# Patient Record
Sex: Male | Born: 1960 | Race: Black or African American | Hispanic: No
Health system: Southern US, Community
[De-identification: ages and names within clinical notes are randomized; demographics above are authoritative.]

## PROBLEM LIST (undated history)

## (undated) DIAGNOSIS — M1990 Unspecified osteoarthritis, unspecified site: Secondary | ICD-10-CM

## (undated) DIAGNOSIS — G8929 Other chronic pain: Secondary | ICD-10-CM

## (undated) DIAGNOSIS — I2510 Atherosclerotic heart disease of native coronary artery without angina pectoris: Secondary | ICD-10-CM

## (undated) DIAGNOSIS — T7840XA Allergy, unspecified, initial encounter: Secondary | ICD-10-CM

## (undated) DIAGNOSIS — E119 Type 2 diabetes mellitus without complications: Secondary | ICD-10-CM

## (undated) DIAGNOSIS — N2889 Other specified disorders of kidney and ureter: Secondary | ICD-10-CM

## (undated) DIAGNOSIS — Z95820 Peripheral vascular angioplasty status with implants and grafts: Secondary | ICD-10-CM

## (undated) DIAGNOSIS — Z9889 Other specified postprocedural states: Secondary | ICD-10-CM

## (undated) DIAGNOSIS — M545 Low back pain, unspecified: Secondary | ICD-10-CM

## (undated) DIAGNOSIS — I639 Cerebral infarction, unspecified: Secondary | ICD-10-CM

## (undated) DIAGNOSIS — I10 Essential (primary) hypertension: Secondary | ICD-10-CM

## (undated) DIAGNOSIS — F1020 Alcohol dependence, uncomplicated: Secondary | ICD-10-CM

## (undated) DIAGNOSIS — E785 Hyperlipidemia, unspecified: Secondary | ICD-10-CM

## (undated) DIAGNOSIS — M25519 Pain in unspecified shoulder: Secondary | ICD-10-CM

## (undated) DIAGNOSIS — R079 Chest pain, unspecified: Secondary | ICD-10-CM

## (undated) DIAGNOSIS — I200 Unstable angina: Secondary | ICD-10-CM

## (undated) DIAGNOSIS — I219 Acute myocardial infarction, unspecified: Secondary | ICD-10-CM

## (undated) DIAGNOSIS — K219 Gastro-esophageal reflux disease without esophagitis: Secondary | ICD-10-CM

## (undated) DIAGNOSIS — F1410 Cocaine abuse, uncomplicated: Secondary | ICD-10-CM

## (undated) HISTORY — PX: LIPOMA EXCISION: SHX5283

## (undated) HISTORY — DX: Unstable angina: I20.0

## (undated) HISTORY — DX: Chest pain, unspecified: R07.9

## (undated) HISTORY — DX: Other specified postprocedural states: Z98.890

## (undated) HISTORY — DX: Hyperlipidemia, unspecified: E78.5

## (undated) HISTORY — DX: Cerebral infarction, unspecified: I63.9

## (undated) HISTORY — PX: TONSILLECTOMY: SUR1361

## (undated) HISTORY — DX: Other chronic pain: G89.29

## (undated) HISTORY — DX: Cocaine abuse, uncomplicated: F14.10

## (undated) HISTORY — DX: Pain in unspecified shoulder: M25.519

## (undated) HISTORY — DX: Allergy, unspecified, initial encounter: T78.40XA

## (undated) HISTORY — DX: Alcohol dependence, uncomplicated: F10.20

---
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
## 1982-06-29 HISTORY — PX: EXCISIONAL HEMORRHOIDECTOMY: SHX1541

## 2002-06-12 ENCOUNTER — Inpatient Hospital Stay (HOSPITAL_COMMUNITY): Admission: EM | Admit: 2002-06-12 | Discharge: 2002-06-16 | Payer: Self-pay | Admitting: Emergency Medicine

## 2002-06-13 ENCOUNTER — Encounter: Payer: Self-pay | Admitting: Cardiology

## 2002-06-14 ENCOUNTER — Encounter: Payer: Self-pay | Admitting: Cardiology

## 2002-06-15 HISTORY — PX: CARDIAC CATHETERIZATION: SHX172

## 2002-08-23 ENCOUNTER — Emergency Department (HOSPITAL_COMMUNITY): Admission: EM | Admit: 2002-08-23 | Discharge: 2002-08-24 | Payer: Self-pay | Admitting: *Deleted

## 2002-09-19 ENCOUNTER — Emergency Department (HOSPITAL_COMMUNITY): Admission: EM | Admit: 2002-09-19 | Discharge: 2002-09-19 | Payer: Self-pay | Admitting: Emergency Medicine

## 2002-09-19 ENCOUNTER — Encounter: Payer: Self-pay | Admitting: Emergency Medicine

## 2002-09-20 ENCOUNTER — Emergency Department (HOSPITAL_COMMUNITY): Admission: EM | Admit: 2002-09-20 | Discharge: 2002-09-21 | Payer: Self-pay | Admitting: Emergency Medicine

## 2002-11-18 ENCOUNTER — Emergency Department (HOSPITAL_COMMUNITY): Admission: EM | Admit: 2002-11-18 | Discharge: 2002-11-18 | Payer: Self-pay | Admitting: Emergency Medicine

## 2003-02-20 ENCOUNTER — Emergency Department (HOSPITAL_COMMUNITY): Admission: EM | Admit: 2003-02-20 | Discharge: 2003-02-20 | Payer: Self-pay | Admitting: *Deleted

## 2003-02-20 ENCOUNTER — Encounter: Payer: Self-pay | Admitting: *Deleted

## 2003-03-06 ENCOUNTER — Emergency Department (HOSPITAL_COMMUNITY): Admission: EM | Admit: 2003-03-06 | Discharge: 2003-03-06 | Payer: Self-pay | Admitting: Emergency Medicine

## 2003-05-15 ENCOUNTER — Emergency Department (HOSPITAL_COMMUNITY): Admission: EM | Admit: 2003-05-15 | Discharge: 2003-05-16 | Payer: Self-pay | Admitting: Emergency Medicine

## 2003-05-20 ENCOUNTER — Emergency Department (HOSPITAL_COMMUNITY): Admission: EM | Admit: 2003-05-20 | Discharge: 2003-05-20 | Payer: Self-pay | Admitting: Emergency Medicine

## 2009-09-05 ENCOUNTER — Emergency Department (HOSPITAL_COMMUNITY): Admission: EM | Admit: 2009-09-05 | Discharge: 2009-09-06 | Payer: Self-pay | Admitting: Emergency Medicine

## 2009-09-06 ENCOUNTER — Inpatient Hospital Stay (HOSPITAL_COMMUNITY): Admission: RE | Admit: 2009-09-06 | Discharge: 2009-09-09 | Payer: Self-pay | Admitting: Psychiatry

## 2009-09-06 ENCOUNTER — Ambulatory Visit: Payer: Self-pay | Admitting: Psychiatry

## 2009-12-02 ENCOUNTER — Emergency Department (HOSPITAL_COMMUNITY): Admission: EM | Admit: 2009-12-02 | Discharge: 2009-12-02 | Payer: Self-pay | Admitting: Emergency Medicine

## 2010-02-10 ENCOUNTER — Other Ambulatory Visit: Payer: Self-pay

## 2010-02-11 ENCOUNTER — Inpatient Hospital Stay (HOSPITAL_COMMUNITY): Admission: AD | Admit: 2010-02-11 | Discharge: 2010-02-14 | Payer: Self-pay | Admitting: Psychiatry

## 2010-02-11 ENCOUNTER — Ambulatory Visit: Payer: Self-pay | Admitting: Psychiatry

## 2010-02-11 ENCOUNTER — Other Ambulatory Visit: Payer: Self-pay | Admitting: Emergency Medicine

## 2010-03-22 ENCOUNTER — Observation Stay (HOSPITAL_COMMUNITY): Admission: EM | Admit: 2010-03-22 | Discharge: 2010-03-23 | Payer: Self-pay | Admitting: Emergency Medicine

## 2010-03-31 ENCOUNTER — Inpatient Hospital Stay (HOSPITAL_COMMUNITY): Admission: AD | Admit: 2010-03-31 | Discharge: 2010-04-02 | Payer: Self-pay | Admitting: Internal Medicine

## 2010-04-01 HISTORY — PX: CARDIAC CATHETERIZATION: SHX172

## 2010-04-03 ENCOUNTER — Encounter (INDEPENDENT_AMBULATORY_CARE_PROVIDER_SITE_OTHER): Payer: Self-pay | Admitting: *Deleted

## 2010-04-14 ENCOUNTER — Encounter: Admission: RE | Admit: 2010-04-14 | Discharge: 2010-04-14 | Payer: Self-pay | Admitting: Family Medicine

## 2010-05-08 ENCOUNTER — Emergency Department (HOSPITAL_COMMUNITY): Admission: EM | Admit: 2010-05-08 | Discharge: 2010-05-08 | Payer: Self-pay | Admitting: Emergency Medicine

## 2010-05-08 ENCOUNTER — Encounter (HOSPITAL_COMMUNITY)
Admission: RE | Admit: 2010-05-08 | Discharge: 2010-07-29 | Payer: Self-pay | Source: Home / Self Care | Attending: Internal Medicine | Admitting: Internal Medicine

## 2010-05-27 ENCOUNTER — Encounter (INDEPENDENT_AMBULATORY_CARE_PROVIDER_SITE_OTHER): Payer: Self-pay | Admitting: *Deleted

## 2010-06-16 ENCOUNTER — Encounter
Admission: RE | Admit: 2010-06-16 | Discharge: 2010-07-29 | Payer: Self-pay | Source: Home / Self Care | Attending: Orthopedic Surgery | Admitting: Orthopedic Surgery

## 2010-06-19 ENCOUNTER — Emergency Department (HOSPITAL_COMMUNITY)
Admission: EM | Admit: 2010-06-19 | Discharge: 2010-06-19 | Payer: Self-pay | Source: Home / Self Care | Admitting: Emergency Medicine

## 2010-06-30 ENCOUNTER — Emergency Department (HOSPITAL_COMMUNITY)
Admission: EM | Admit: 2010-06-30 | Discharge: 2010-06-30 | Payer: Medicaid Other | Source: Home / Self Care | Admitting: Emergency Medicine

## 2010-07-10 ENCOUNTER — Encounter: Payer: Self-pay | Admitting: Gastroenterology

## 2010-07-10 ENCOUNTER — Ambulatory Visit
Admission: RE | Admit: 2010-07-10 | Discharge: 2010-07-10 | Payer: Self-pay | Source: Home / Self Care | Attending: Gastroenterology | Admitting: Gastroenterology

## 2010-07-10 DIAGNOSIS — I2510 Atherosclerotic heart disease of native coronary artery without angina pectoris: Secondary | ICD-10-CM | POA: Insufficient documentation

## 2010-07-10 DIAGNOSIS — K3189 Other diseases of stomach and duodenum: Secondary | ICD-10-CM | POA: Insufficient documentation

## 2010-07-10 DIAGNOSIS — Z9861 Coronary angioplasty status: Secondary | ICD-10-CM

## 2010-07-10 DIAGNOSIS — E119 Type 2 diabetes mellitus without complications: Secondary | ICD-10-CM | POA: Insufficient documentation

## 2010-07-10 DIAGNOSIS — R1013 Epigastric pain: Secondary | ICD-10-CM

## 2010-07-23 ENCOUNTER — Encounter: Payer: Self-pay | Admitting: Gastroenterology

## 2010-07-23 ENCOUNTER — Other Ambulatory Visit: Payer: Self-pay | Admitting: Gastroenterology

## 2010-07-23 ENCOUNTER — Ambulatory Visit
Admission: RE | Admit: 2010-07-23 | Discharge: 2010-07-23 | Payer: Self-pay | Source: Home / Self Care | Attending: Gastroenterology | Admitting: Gastroenterology

## 2010-07-23 DIAGNOSIS — K219 Gastro-esophageal reflux disease without esophagitis: Secondary | ICD-10-CM

## 2010-07-23 LAB — GLUCOSE, CAPILLARY
Glucose-Capillary: 93 mg/dL (ref 70–99)
Glucose-Capillary: 82 mg/dL (ref 70–99)

## 2010-07-24 ENCOUNTER — Encounter: Payer: Self-pay | Admitting: Gastroenterology

## 2010-07-29 NOTE — Letter (Signed)
Summary: New Patient letter  [facility]  [address]   [place], [place] [address]   Phone: [phone]  Fax: [phone]       05/27/2010 MRN: [id number]  JEFFERY Teaney [address] [place], [place]  [address]  Dear Mr. Leinbach,  Welcome to the Gastroenterology Division at [facility].    You are scheduled to see Dr. Arlyce Dice on 07/10/2010 at 8:30 am on the 3rd floor at [facility], [address]  We ask that you try to arrive at our office 15 minutes prior to your appointment time to allow for check-in.  We would like you to complete the enclosed self-administered evaluation form prior to your visit and bring it with you on the day of your appointment.  We will review it with you.  Also, please bring a complete list of all your medications or, if you prefer, bring the medication bottles and we will list them.  Please bring your insurance card so that we may make a copy of it.  If your insurance requires a referral to see a specialist, please bring your referral form from your primary care physician.  Co-payments are due at the time of your visit and may be paid by cash, check or credit card.     Your office visit will consist of a consult with your physician (includes a physical exam), any laboratory testing he/she may order, scheduling of any necessary diagnostic testing (e.g. x-ray, ultrasound, CT-scan), and scheduling of a procedure (e.g. Endoscopy, Colonoscopy) if required.  Please allow enough time on your schedule to allow for any/all of these possibilities.    If you cannot keep your appointment, please call [phone] to cancel or reschedule prior to your appointment date.  This allows [place] the opportunity to schedule an appointment for another patient in need of care.  If you do not cancel or reschedule by 5 p.m. the business day prior to your appointment date, you will be charged a $50.00 late cancellation/no-show fee.    Thank you for  choosing [facility] for your medical needs.  We appreciate the opportunity to care for you.  Please visit [place] at our website  to learn more about our practice.                     Sincerely,                                                             The Gastroenterology Division

## 2010-07-30 ENCOUNTER — Encounter (HOSPITAL_COMMUNITY): Payer: Medicaid Other | Attending: Family Medicine

## 2010-07-30 ENCOUNTER — Encounter: Payer: Self-pay | Admitting: Gastroenterology

## 2010-07-30 DIAGNOSIS — Z5189 Encounter for other specified aftercare: Secondary | ICD-10-CM | POA: Insufficient documentation

## 2010-07-30 DIAGNOSIS — I10 Essential (primary) hypertension: Secondary | ICD-10-CM | POA: Insufficient documentation

## 2010-07-30 DIAGNOSIS — I2510 Atherosclerotic heart disease of native coronary artery without angina pectoris: Secondary | ICD-10-CM | POA: Insufficient documentation

## 2010-07-30 DIAGNOSIS — I252 Old myocardial infarction: Secondary | ICD-10-CM | POA: Insufficient documentation

## 2010-07-30 DIAGNOSIS — Z9861 Coronary angioplasty status: Secondary | ICD-10-CM | POA: Insufficient documentation

## 2010-07-30 DIAGNOSIS — E119 Type 2 diabetes mellitus without complications: Secondary | ICD-10-CM | POA: Insufficient documentation

## 2010-07-31 NOTE — Letter (Signed)
Summary: EGD Instructions  [name]  [address] [place], [place] [address]   Phone: [phone]  Fax: [phone]       [name]    05/22/1961    MRN: [id number]       Procedure Day /Date:07/23/2010 [facility]     Arrival Time: 3PM     Procedure Time:4PM     Location of Procedure:                    X  [facility] (4th Floor)  PREPARATION FOR ENDOSCOPY   On 07/23/2010  THE DAY OF THE PROCEDURE:  1.   No solid foods, milk or milk products are allowed after midnight the night before your procedure.  2.   Do not drink anything colored red or purple.  Avoid juices with pulp.  No orange juice.  3.  You may drink clear liquids until2PM , which is 2 hours before your procedure.                                                                                                CLEAR LIQUIDS INCLUDE: Water Jello Ice Popsicles Tea (sugar ok, no milk/cream) Powdered fruit flavored drinks Coffee (sugar ok, no milk/cream) Gatorade Juice: apple, white grape, white cranberry  Lemonade Clear bullion, consomm, broth Carbonated beverages (any kind) Strained chicken noodle soup Hard Candy   MEDICATION INSTRUCTIONS  Unless otherwise instructed, you should take regular prescription medications with a small sip of water as early as possible the morning of your procedure   STAY ON PLAVIX PER DR Lajeana Strough           OTHER INSTRUCTIONS  You will need a responsible adult at least 49 years of age to accompany you and drive you home.   This person must remain in the waiting room during your procedure.  Wear loose fitting clothing that is easily removed.  Leave jewelry and other valuables at home.  However, you may wish to bring a book to read or an iPod/MP3 player to listen to music as you wait for your procedure to start.  Remove all body piercing jewelry and leave at home.  Total time from sign-in until discharge is approximately 2-3 hours.  You  should go home directly after your procedure and rest.  You can resume normal activities the day after your procedure.  The day of your procedure you should not:   Drive   Make legal decisions   Operate machinery   Drink alcohol   Return to work  You will receive specific instructions about eating, activities and medications before you leave.    The above instructions have been reviewed and explained to me by   _______________________    I fully understand and can verbalize these instructions _____________________________ Date _________

## 2010-07-31 NOTE — Assessment & Plan Note (Signed)
Summary: BLOATING.[name]   History of Present Illness Visit Type: new patient  Primary GI MD: Melvia Heaps MD [facility] Primary Provider: Germain Osgood, MD  Requesting Provider: na Chief Complaint: Pt c/o gerd, belching, bloating, chest pain, weight loss, and change in bowel habits, not as freequent as it was  History of Present Illness:   [name] is a pleasant 50 year old former male referred at the request of Dr. Robynn Pane for evaluation of upper abdominal discomfort.  For the past 3 months he has been complaining of excess belching.  This begins during a meal and will last throughout the day.  He complains of mild distention.  He denies nausea or abdominal pain, per se.  There has been no change in his bowel habits.  He had some pyrosis several months ago for which she took medicine.  This has subsided.  He denies dysphagia.  The patient was hospitalized for angina in October, 2011. A drug-eluting stent was placed.  The patient is on Plavix.   GI Review of Systems    Reports acid reflux, belching, bloating, chest pain, and  heartburn.      Denies abdominal pain, dysphagia with liquids, dysphagia with solids, loss of appetite, nausea, vomiting, vomiting blood, weight loss, and  weight gain.      Reports change in bowel habits.     Denies anal fissure, black tarry stools, constipation, diarrhea, diverticulosis, fecal incontinence, heme positive stool, hemorrhoids, irritable bowel syndrome, jaundice, light color stool, liver problems, rectal bleeding, and  rectal pain.    Current Medications (verified): 1)  Aspirin 325 Mg Tabs (Aspirin) .Marland Kitchen.. 1 By Mouth Once Daily 2)  Imdur 30 Mg Xr24h-Tab (Isosorbide Mononitrate) .Marland Kitchen.. 1 By Mouth Once Daily 3)  Coreg 3.125 Mg Tabs (Carvedilol) .Marland Kitchen.. 1 By Mouth Two Times A Day 4)  Lipitor 40 Mg Tabs (Atorvastatin Calcium) .Marland Kitchen.. 1 By Mouth Once Daily 5)  Lisinopril 30 Mg Tabs (Lisinopril) .Marland Kitchen.. 1 By Mouth Once Daily 6)  Metformin Hcl 1000 Mg Tabs (Metformin Hcl) .Marland Kitchen..  1 By Mouth Two Times A Day 7)  Mirtazapine 15 Mg Tabs (Mirtazapine) .Marland Kitchen.. 1 By Mouth At Bedtime 8)  Nitro-Dur 0.4 Mg/hr Pt24 (Nitroglycerin) .... As Needed 9)  Plavix 75 Mg Tabs (Clopidogrel Bisulfate) .Marland Kitchen.. 1 By Mouth Once Daily  Allergies (verified): No Known Drug Allergies  Past History:  Past Medical History: CAD Diabetes Hyperlipidemia Hypertension Hx of alcohol and cocaine abuse 1. Unstable angina status post heart catheterization on March 31, 2010 with stenting to the circumflex and balloon cutting to the       right coronary artery.   2. Coronary artery disease status post percutaneous coronary       intervention, x3 drug-eluting stents, July 2010.  Depression Arthritis  Past Surgical History: Reviewed history from 07/04/2010 and no changes required. Heart catheterization with stenting to the circumflex and balloon cutting to the right coronary artery  Family History: No FH of Colon Cancer: Family History of Diabetes: Aunts and Uncles on both sides  Family History of Heart Disease: PGM   Social History: Disabled Divorced 2 childern Patient is a former smoker.  Alcohol Use - no Daily Caffeine Use: 2 daily  Illicit Drug Use - no Smoking Status:  quit Drug Use:  no  Review of Systems  The patient denies allergy/sinus, anemia, anxiety-new, arthritis/joint pain, back pain, blood in urine, breast changes/lumps, change in vision, confusion, cough, coughing up blood, depression-new, fainting, fatigue, fever, headaches-new, hearing problems,  heart murmur, heart rhythm changes, itching, menstrual pain, muscle pains/cramps, night sweats, nosebleeds, pregnancy symptoms, shortness of breath, skin rash, sleeping problems, sore throat, swelling of feet/legs, swollen lymph glands, thirst - excessive , urination - excessive , urination changes/pain, urine leakage, vision changes, and voice change.    Vital Signs:  Patient profile:   50 year old male Height:      68  inches Weight:      196 pounds BMI:     29.91 BSA:     2.03 Pulse rate:   76 / minute Pulse rhythm:   regular BP sitting:   132 / 84  (left arm) Cuff size:   regular  Vitals Entered By: Ok Anis CMA (July 10, 2010 9:07 AM)  Physical Exam  Additional Exam:  On physical exam is well-developed well-nourished male  skin: anicteric HEENT: normocephalic; PEERLA; no nasal or pharyngeal abnormalities neck: supple nodes: no cervical lymphadenopathy chest: clear to ausculatation and percussion heart: no murmurs, gallops, or rubs abd: soft, nontender; BS normoactive; no abdominal masses, tenderness, organomegaly rectal: deferred ext: no cynanosis, clubbing, edema skeletal: no deformities neuro: oriented x 3; no focal abnormalities    Impression & Recommendations:  Problem # 1:  DYSPEPSIA&OTHER SPEC DISORDERS FUNCTION STOMACH (ICD-536.8)  Symptoms could be related to ulcer or nonulcer dyspepsia.  He denies excessive oral intake of fluids.  Medication effect is also a consideration.  Gastroparesis more often causes nausea although  this also must be considered in view of his diabetes.  Recommendations #1 [place] 2 #2 upper endoscopy #3 to consider gastric emptying scan pending results of above  Orders: EGD (EGD)  Problem # 2:  CAD (ICD-414.00) Patient remains on Plavix for a drug eluding stent  Problem # 3:  DM (ICD-250.00) Assessment: Comment Only  Patient Instructions: 1)  Copy sent to : Germain Osgood, MD  2)  Your EGD is scheduled for 07/23/2010 at 4pm 3)  You will to continue to take your plavix per Dr Arlyce Dice 4)  You have been instructed on your diabetic medications 5)  Conscious Sedation brochure given.  6)  Upper Endoscopy brochure given.  7)  The medication list was reviewed and reconciled.  All changed / newly prescribed medications were explained.  A complete medication list was provided to the patient / caregiver.

## 2010-07-31 NOTE — Letter (Signed)
Summary: Diabetic Instructions  [facility]  [address] [place], [place] [address]   Phone: [phone]  Fax: [phone]    [name] 03-30-1961 MRN: [id number]   X   ORAL DIABETIC MEDICATION INSTRUCTIONS                                           METFORMIN The day before your procedure:   Take your diabetic pill as you do normally  The day of your procedure:   Do not take your diabetic pill    We will check your blood sugar levels during the admission process and again in Recovery before discharging you home  ________________________________________________________________________  _  _   INSULIN (LONG ACTING) MEDICATION INSTRUCTIONS (Lantus, NPH, 70/30, Humulin, Novolin-N)   The day before your procedure:   Take  your regular evening dose    The day of your procedure:   Do not take your morning dose    _  _   INSULIN (SHORT ACTING) MEDICATION INSTRUCTIONS (Regular, Humulog, Novolog)   The day before your procedure:   Do not take your evening dose   The day of your procedure:   Do not take your morning dose   _  _   INSULIN PUMP MEDICATION INSTRUCTIONS  We will contact the physician managing your diabetic care for written dosage instructions for the day before your procedure and the day of your procedure.  Once we have received the instructions, we will contact you.

## 2010-07-31 NOTE — Letter (Signed)
Summary: Appt Reminder 2  [facility]  [address] [place], [place] [address]   Phone: [phone]  Fax: [phone]        July 24, 2010 MRN: [id number]    [name] [address] [place], [place]  [address]    Dear Mr. Trickey,   You have a return appointment with Dr. Arlyce Dice on 08/26/10 at 10:30am.  Please remember to bring a complete list of the medicines you are taking, your insurance card and your co-pay.  If you have to cancel or reschedule this appointment, please call before 5:00 pm the evening before to avoid a cancellation fee.  If you have any questions or concerns, please call [phone].    Sincerely,    Selinda Michaels RN  Appended Document: Appt Reminder 2 Letter is mailed to the patient's home address

## 2010-07-31 NOTE — Procedures (Addendum)
Summary: Upper Endoscopy  Patient: [name] Note: All result statuses are Final unless otherwise noted.  Tests: (1) Upper Endoscopy (EGD)   EGD Upper Endoscopy       DONE     [facility]     [address]     [place], [place]  [address]           ENDOSCOPY PROCEDURE REPORT           PATIENT:  [name], [name]  MR#:  [id number]     BIRTHDATE:  06-05-1961, 49 yrs. old  GENDER:  male           ENDOSCOPIST:  Barbette Hair. Arlyce Dice, MD     Referred by:  Germain Osgood, M.D.           PROCEDURE DATE:  07/23/2010     PROCEDURE:  EGD with biopsy, [address]     ASA CLASS:  Class II     INDICATIONS:  dyspepsia           MEDICATIONS:   Fentanyl 50 mcg IV, Versed 6 mg IV, glycopyrrolate     (Robinal) 0.2 mg IV, 0.6cc simethancone 0.6 cc PO     TOPICAL ANESTHETIC:  Exactacain Spray           DESCRIPTION OF PROCEDURE:   After the risks benefits and     alternatives of the procedure were thoroughly explained, informed     consent was obtained.  The LB GIF-H180 [id number] endoscope was     introduced through the mouth and advanced to the third portion of     the duodenum, without limitations.  The instrument was slowly     withdrawn as the mucosa was fully examined.     <<PROCEDUREIMAGES>>           Moderate gastritis was found in the body and the antrum of the     stomach. Diffuse, erosive gastritis. Bxs taken (see image5,     image4, and image3).  irregular Z-line (see image9 and image2).     Bxs taken to r/o Barrett's esophagus  Otherwise the examination     was normal (see image6, image7, and image10).    Retroflexed views     revealed no abnormalities.    The scope was then withdrawn from     the patient and the procedure completed.           COMPLICATIONS:  None           ENDOSCOPIC IMPRESSION:     1) Moderate gastritis in the body and the antrum of the stomach           2) Irregular Z-line - r/o Barrett's esophagus     3) Otherwise normal examination     RECOMMENDATIONS:     1)  Avoid NSAIDS     2) Prilosec 20 mg po q am     3) Call office next 2-3 days to schedule an office appointment     for 1 month           REPEAT EXAM:  No           ______________________________     Barbette Hair. Arlyce Dice, MD           CC:           n.     eSIGNED:   Barbette Hair. Kaplan at 07/23/2010 04:19 PM           Jeanett Schlein, [id number]  Note: An exclamation mark (!) indicates a result that was not dispersed into the flowsheet. Document Creation Date: 07/23/2010 4:19 PM _______________________________________________________________________  (1) Order result status: Final Collection or observation date-time: 07/23/2010 16:11 Requested date-time:  Receipt date-time:  Reported date-time:  Referring Physician:   Ordering Physician: Melvia Heaps [phone]) Specimen Source:  Source: Launa Grill Order Number: [id number] Lab site:

## 2010-07-31 NOTE — Discharge Summary (Signed)
[name]                ACCOUNT NO.:  [id number]      MEDICAL RECORD NO.:  [id number]          PATIENT TYPE:  INP      LOCATION:  2508                         FACILITY:  [facility]      PHYSICIAN:  Nicki Guadalajara, M.D.     DATE OF BIRTH:  Apr 20, 1961      DATE OF ADMISSION:  03/31/2010   DATE OF DISCHARGE:  04/02/2010                                  DISCHARGE SUMMARY         DISCHARGE DIAGNOSES:   1. Unstable angina status post heart catheterization on March 31, 2010 with stenting to the circumflex and balloon cutting to the       right coronary artery.   2. Coronary artery disease status post percutaneous coronary       intervention, x3 drug-eluting stents, July 2010.   3. Hypertension.   4. Diabetes.   5. Hyperlipidemia.   6. History of alcohol and cocaine abuse.      HOSPITAL COURSE:  [name] is a 50 year old male with a history of CAD   with cardiac catheterization in 2003.  He has also had numerous non-ST-   elevation MIs.  He has received 3 drug eluting stents to the LAD in July   2010.  The patient states his chest pain has steadily been increasing in   frequency.  The patient does exercise regularly and the chest pain is   relieved with nitroglycerin.  He was seen in the office on March 31, 2010 for a treadmill stress test, at which point the patient developed   8/10 chest pain with ST depression approximately 2 minutes during   exercise and stage I of the Bruce protocol.  Rest images obtained showed   small inferior defect.  The patient was then admitted to [facility] with   plans for cardiac catheterization.  The patient was made n.p.o.  Cardiac   catheterization was performed on April 01, 2010.  PCI and stenting   completed.  Dr. Tresa Endo has seen and examined [name] and feels he is   stable for discharge.      DISCHARGE LABORATORY DATA:  WBC 10.0, hemoglobin 12.9, hematocrit 38.7,   and platelets 234.  Sodium 139, potassium 3.7, chloride 109, carbon    dioxide 26, glucose 245, BUN 11, creatinine 0.97, and calcium 9.0.   Total cholesterol 84, triglycerides 71, HDL 35, and LDL 35.      STUDIES/PROCEDURES:       Cardiac catheterization:  RCA was found to have 70%   area of in-stent stenosis with some minimal stent margin irregularities.   Cutting balloon was placed to remove the stent.  The area that had 70%   narrowing previously resulted in TIMI 3 flow.  No dissection or   thrombus.  Proximal stent in the circumflex had a 70% area of in-stent   stenosis.  Cutting balloon was used and a Promus drug eluting stent was   then inserted, overlapping between the proximal and distal stent from  the circ.      DISCHARGE MEDICATIONS:   1. Acetaminophen 325 mg tablets, 2 tablets by mouth every 4 hours as       needed for pain.   2. Aspirin 325 mg enteric-coated 1 tablet by mouth daily until the       patient follows with Dr. Rennis Golden.   3. Imdur 30 mg 1 tablet by mouth daily.   4. Coreg 3.125 mg tablets, 1 tablet twice daily with meal.   5. Pepcid 20 mg tablets, 1 tablet by mouth twice daily.   6. Lipitor 40 mg 1 tablet by mouth daily at bedtime.   7. Lisinopril 30 mg 1 tablet by mouth daily.   8. Metformin 1000 mg 1 tablet by mouth twice daily.  The patient is       not to resume this medication until April 04, 2010.   9. Mirtazapine 15 mg 1 tablet by mouth daily at bedtime.   10.Multivitamins 1 tablet by mouth daily.   11.Nitroglycerin sublingual 0.4 mg tablets, 1 tablet every 5 minutes       as needed for chest pain, total of 3 tablets maximum.   12.Plavix 75 mg tablets, 1 tablet by mouth daily with the meal.      The patient will stop his aspirin 81 mg.      DISPOSITION:  [name] will be discharged home in stable condition.  He   is to increase the activity slowly.  He may shower and bath.  No driving   for 2 days.  He is to eat a heart-healthy diet.  He may wash his cath   site with soap and water.  He is to call our office at  [phone] if   that area becomes painful, red, swollen, or discharge fluid.  He will   followup with Dr. Rennis Golden on April 10, 2010 which is Thursday at 10:00   a.m., the number was provided.            ______________________________   Wilburt Finlay, PA         ______________________________   Nicki Guadalajara, M.D.            BH/MEDQ  D:  04/02/2010  T:  04/03/2010  Job:  [id number]      Electronically Signed by Wilburt Finlay PA on 04/08/2010 04:50:01 PM   Electronically Signed by Nicki Guadalajara M.D. on 04/15/2010 03:58:13 PM

## 2010-07-31 NOTE — Miscellaneous (Signed)
  Clinical Lists Changes  Medications: Added new medication of PRILOSEC OTC 20 MG  TBEC (OMEPRAZOLE MAGNESIUM) 1 each day 30 minutes before meal - Signed Rx of PRILOSEC OTC 20 MG  TBEC (OMEPRAZOLE MAGNESIUM) 1 each day 30 minutes before meal;  #30 x 2;  Signed;  Entered by: Louis Meckel MD;  Authorized by: Louis Meckel MD;  Method used: Electronically to [facility] [name] [place], [place], [place]  [address], Ph: [phone], Fax: [phone]    Prescriptions: PRILOSEC OTC 20 MG  TBEC (OMEPRAZOLE MAGNESIUM) 1 each day 30 minutes before meal  #30 x 2   Entered and Authorized by:   Louis Meckel MD   Signed by:   Louis Meckel MD on 07/23/2010   Method used:   Electronically to        [facility] [address] (retail)       [phone]       [place], [place]  [address]       Ph: [phone]       Fax: [phone]   RxID:   [id number]

## 2010-08-01 ENCOUNTER — Encounter (HOSPITAL_COMMUNITY): Payer: Medicaid Other

## 2010-08-04 ENCOUNTER — Encounter (HOSPITAL_COMMUNITY): Payer: Medicaid Other

## 2010-08-06 ENCOUNTER — Encounter (HOSPITAL_COMMUNITY): Payer: Medicaid Other

## 2010-08-06 NOTE — Letter (Signed)
Summary: Results Letter  [facility]  [address] [place], [place] [address]   Phone: [phone]  Fax: [phone]        July 30, 2010 MRN: [id number]    [name] [address] [place], [place]  [address]    Dear [name],   Your biopsies demonstrated inflammatory changes only.    Please follow the recommendations previously discussed.  Should you have any immediate concerns or questions, feel free to contact me at the office.    Sincerely,  Barbette Hair. Arlyce Dice, M.D., [facility]          Sincerely,  Louis Meckel MD  This letter has been electronically signed by your physician.  Appended Document: Results Letter letter mailed

## 2010-08-08 ENCOUNTER — Encounter (HOSPITAL_COMMUNITY): Payer: Medicaid Other

## 2010-08-11 ENCOUNTER — Encounter (HOSPITAL_COMMUNITY): Payer: Medicaid Other

## 2010-08-13 ENCOUNTER — Encounter (HOSPITAL_COMMUNITY): Payer: Medicaid Other

## 2010-08-15 ENCOUNTER — Encounter (HOSPITAL_COMMUNITY): Payer: Medicaid Other

## 2010-08-18 ENCOUNTER — Encounter (HOSPITAL_COMMUNITY): Payer: Medicaid Other

## 2010-08-20 ENCOUNTER — Encounter (HOSPITAL_COMMUNITY): Payer: Medicaid Other

## 2010-08-22 ENCOUNTER — Encounter (HOSPITAL_COMMUNITY): Payer: Medicaid Other

## 2010-08-23 ENCOUNTER — Emergency Department (HOSPITAL_COMMUNITY): Payer: Medicaid Other

## 2010-08-23 ENCOUNTER — Inpatient Hospital Stay (HOSPITAL_COMMUNITY)
Admission: EM | Admit: 2010-08-23 | Discharge: 2010-08-26 | DRG: 251 | Disposition: A | Discharge status: Home / Self Care | Payer: Medicaid Other | Attending: Cardiovascular Disease | Admitting: Cardiovascular Disease

## 2010-08-23 DIAGNOSIS — F1411 Cocaine abuse, in remission: Secondary | ICD-10-CM | POA: Diagnosis present

## 2010-08-23 DIAGNOSIS — E785 Hyperlipidemia, unspecified: Secondary | ICD-10-CM | POA: Diagnosis present

## 2010-08-23 DIAGNOSIS — F1011 Alcohol abuse, in remission: Secondary | ICD-10-CM | POA: Diagnosis present

## 2010-08-23 DIAGNOSIS — I200 Unstable angina: Secondary | ICD-10-CM | POA: Diagnosis present

## 2010-08-23 DIAGNOSIS — I252 Old myocardial infarction: Secondary | ICD-10-CM

## 2010-08-23 DIAGNOSIS — Z7902 Long term (current) use of antithrombotics/antiplatelets: Secondary | ICD-10-CM

## 2010-08-23 DIAGNOSIS — I2510 Atherosclerotic heart disease of native coronary artery without angina pectoris: Secondary | ICD-10-CM | POA: Diagnosis present

## 2010-08-23 DIAGNOSIS — Y849 Medical procedure, unspecified as the cause of abnormal reaction of the patient, or of later complication, without mention of misadventure at the time of the procedure: Secondary | ICD-10-CM | POA: Diagnosis present

## 2010-08-23 DIAGNOSIS — Z87891 Personal history of nicotine dependence: Secondary | ICD-10-CM

## 2010-08-23 DIAGNOSIS — I10 Essential (primary) hypertension: Secondary | ICD-10-CM | POA: Diagnosis present

## 2010-08-23 DIAGNOSIS — Z79899 Other long term (current) drug therapy: Secondary | ICD-10-CM

## 2010-08-23 DIAGNOSIS — T82897A Other specified complication of cardiac prosthetic devices, implants and grafts, initial encounter: Principal | ICD-10-CM | POA: Diagnosis present

## 2010-08-23 DIAGNOSIS — E119 Type 2 diabetes mellitus without complications: Secondary | ICD-10-CM | POA: Diagnosis present

## 2010-08-23 LAB — DIFFERENTIAL
Basophils Absolute: 0 10*3/uL (ref 0.0–0.1)
Basophils Relative: 1 % (ref 0–1)
Lymphocytes Relative: 44 % (ref 12–46)
Monocytes Absolute: 0.5 10*3/uL (ref 0.1–1.0)
Neutro Abs: 2.3 10*3/uL (ref 1.7–7.7)

## 2010-08-23 LAB — CBC
Hemoglobin: 13.6 g/dL (ref 13.0–17.0)
MCHC: 34.3 g/dL (ref 30.0–36.0)
RDW: 12 % (ref 11.5–15.5)
WBC: 5 10*3/uL (ref 4.0–10.5)

## 2010-08-23 LAB — COMPREHENSIVE METABOLIC PANEL
AST: 36 U/L (ref 0–37)
Albumin: 4.1 g/dL (ref 3.5–5.2)
BUN: 10 mg/dL (ref 6–23)
Creatinine, Ser: 0.96 mg/dL (ref 0.4–1.5)
GFR calc Af Amer: >60 mL/min (ref >60)
Potassium: 3.9 mEq/L (ref 3.5–5.1)
Total Protein: 6.6 g/dL (ref 6.0–8.3)

## 2010-08-23 LAB — PROTIME-INR
INR: 0.99 (ref 0.00–1.49)
Prothrombin Time: 13.3 seconds (ref 11.6–15.2)

## 2010-08-23 LAB — CK TOTAL AND CKMB (NOT AT ARMC)
CK, MB: 11.9 ng/mL (ref 0.3–4.0)
Relative Index: 1.5 (ref 0.0–2.5)

## 2010-08-23 LAB — MRSA PCR SCREENING: MRSA by PCR: NEGATIVE

## 2010-08-24 LAB — URINALYSIS, MICROSCOPIC ONLY
Hgb urine dipstick: NEGATIVE
Protein, ur: NEGATIVE mg/dL
Urine Glucose, Fasting: NEGATIVE mg/dL
Urine-Other: NONE SEEN

## 2010-08-24 LAB — LIPID PANEL
Cholesterol: 126 mg/dL (ref 0–200)
HDL: 35 mg/dL — ABNORMAL LOW (ref >39)
LDL Cholesterol: 77 mg/dL (ref 0–99)
Triglycerides: 69 mg/dL (ref <150)

## 2010-08-24 LAB — CARDIAC PANEL(CRET KIN+CKTOT+MB+TROPI)
CK, MB: 7.5 ng/mL (ref 0.3–4.0)
Relative Index: 1.4 (ref 0.0–2.5)
Total CK: 534 U/L — ABNORMAL HIGH (ref 7–232)
Total CK: 446 U/L — ABNORMAL HIGH (ref 7–232)
Troponin I: 0.01 ng/mL (ref 0.00–0.06)
Troponin I: 0.01 ng/mL (ref 0.00–0.06)

## 2010-08-24 LAB — BASIC METABOLIC PANEL
GFR calc Af Amer: >60 mL/min (ref >60)
GFR calc non Af Amer: >60 mL/min (ref >60)
Potassium: 3.6 mEq/L (ref 3.5–5.1)
Sodium: 132 mEq/L — ABNORMAL LOW (ref 135–145)

## 2010-08-24 LAB — GLUCOSE, CAPILLARY
Glucose-Capillary: 133 mg/dL — ABNORMAL HIGH (ref 70–99)
Glucose-Capillary: 161 mg/dL — ABNORMAL HIGH (ref 70–99)
Glucose-Capillary: 93 mg/dL (ref 70–99)
Glucose-Capillary: 127 mg/dL — ABNORMAL HIGH (ref 70–99)
Glucose-Capillary: 170 mg/dL — ABNORMAL HIGH (ref 70–99)

## 2010-08-24 LAB — CBC
Platelets: 238 10*3/uL (ref 150–400)
RDW: 12.2 % (ref 11.5–15.5)
WBC: 4.8 10*3/uL (ref 4.0–10.5)

## 2010-08-24 LAB — TSH: TSH: 1.77 u[IU]/mL (ref 0.350–4.500)

## 2010-08-24 LAB — HEPARIN LEVEL (UNFRACTIONATED): Heparin Unfractionated: 0.4 IU/mL (ref 0.30–0.70)

## 2010-08-25 ENCOUNTER — Encounter (HOSPITAL_COMMUNITY): Payer: Medicaid Other

## 2010-08-25 HISTORY — PX: CARDIAC CATHETERIZATION: SHX172

## 2010-08-25 LAB — CBC
MCH: 27.7 pg (ref 26.0–34.0)
MCHC: 32.8 g/dL (ref 30.0–36.0)
MCV: 84.3 fL (ref 78.0–100.0)
Platelets: 232 10*3/uL (ref 150–400)
RBC: 4.77 MIL/uL (ref 4.22–5.81)
RDW: 12.3 % (ref 11.5–15.5)

## 2010-08-25 LAB — BASIC METABOLIC PANEL
BUN: 9 mg/dL (ref 6–23)
Calcium: 8.8 mg/dL (ref 8.4–10.5)
Creatinine, Ser: 0.79 mg/dL (ref 0.4–1.5)
GFR calc non Af Amer: >60 mL/min (ref >60)
Glucose, Bld: 116 mg/dL — ABNORMAL HIGH (ref 70–99)

## 2010-08-25 LAB — HEPARIN LEVEL (UNFRACTIONATED): Heparin Unfractionated: 0.19 IU/mL — ABNORMAL LOW (ref 0.30–0.70)

## 2010-08-25 LAB — GLUCOSE, CAPILLARY: Glucose-Capillary: 119 mg/dL — ABNORMAL HIGH (ref 70–99)

## 2010-08-26 ENCOUNTER — Ambulatory Visit: Payer: Self-pay | Admitting: Gastroenterology

## 2010-08-26 LAB — CBC
HCT: 38.5 % — ABNORMAL LOW (ref 39.0–52.0)
Hemoglobin: 12.7 g/dL — ABNORMAL LOW (ref 13.0–17.0)
MCH: 27.5 pg (ref 26.0–34.0)
MCHC: 33 g/dL (ref 30.0–36.0)
RBC: 4.61 MIL/uL (ref 4.22–5.81)

## 2010-08-26 LAB — BASIC METABOLIC PANEL
CO2: 25 mEq/L (ref 19–32)
Calcium: 8.6 mg/dL (ref 8.4–10.5)
Chloride: 113 mEq/L — ABNORMAL HIGH (ref 96–112)
Creatinine, Ser: 0.88 mg/dL (ref 0.4–1.5)
GFR calc Af Amer: >60 mL/min (ref >60)
Glucose, Bld: 172 mg/dL — ABNORMAL HIGH (ref 70–99)
Sodium: 143 mEq/L (ref 135–145)

## 2010-08-27 ENCOUNTER — Encounter (HOSPITAL_COMMUNITY): Payer: Medicaid Other

## 2010-08-29 ENCOUNTER — Encounter (HOSPITAL_COMMUNITY): Payer: Self-pay

## 2010-09-01 ENCOUNTER — Encounter (HOSPITAL_COMMUNITY): Payer: Self-pay

## 2010-09-01 NOTE — Procedures (Signed)
NAMEJACQUEZ, SHEETZ                ACCOUNT NO.:  [id number]  MEDICAL RECORD NO.:  [id number]           PATIENT TYPE:  I  LOCATION:  2923                         FACILITY:  [facility]  PHYSICIAN:  Nicki Guadalajara, M.D.     DATE OF BIRTH:  [birth year]  DATE OF PROCEDURE:  08/25/2010 DATE OF DISCHARGE:                           CARDIAC CATHETERIZATION   INDICATIONS:  Mr. [name] is a 50 year old African American gentleman, who has known coronary artery disease.  He underwent initial remote stenting to his circumflex coronary artery as well as his right coronary artery in [place].  Initially, he had a proximal mid circumflex stent and one in the right coronary artery.  In October 2011, he was found to have 70% in-stent restenosis in the distal portion of the proximal stent circumflex with 70% in-stent restenosis in the RCA. At that time, he underwent cutting balloon arthrotomy with a 3.0 x 15-mm cutting balloon and had a 3.0 Promus DES stent inserted between the 2 proximal and circumflex stents.  He also underwent cutting balloon arthrotomy of his RCA.  He had done well subsequently.  He has been exercising fairly frequently.  However, over the past week, he had noticed recurrent episodes of the chest discomfort.  He finally presented to the emergency room on Saturday, where his chest pain was nitrate responsive.  CPK enzyme was elevated at 774 with an MB of 11.9 and a relative index of 1.5.  Troponins were negative.  Subsequent CK was 534 with an MB of 7.5 and a relative index of 1.4.  He is now referred for definitive repeat cardiac catheterization.  PROCEDURE:  After premedication with Versed 2 mg plus fentanyl 25 mcg intravenously, the patient was prepped and draped in the usual fashion. His right femoral artery was punctured anteriorly and a 5-French sheath was inserted without difficulty.  Diagnostic cardiac catheterization was done utilizing 5-French Judkins for left and right  coronary catheters. A 5-French pigtail catheter was used for biplane cine left ventriculography.  With the demonstration of in-stent restenosis in the proximally placed circumflex stent extending to the mid stent, the decision was made to attempt intervention with cutting balloon arthrotomy.  A 6-French sheath was inserted in place with a 5-French sheath.  Bivalve routing was administered in bolus plus infusion and ACT was documented to be therapeutic.  The patient is diabetic and the decision was made to switch him from Plavix to Effient and he received a 60 mg Effient load. IC nitroglycerin was also administered down the circumflex system selectively.  A 6-French XB 3.5 guide was used for the intervention.  A Prowater wire was advanced down the circumflex system.  A 3.25 x 15-mm cutting balloon arthrotomy catheter was then inserted and multiple cuts were made in the proximal extending to the mid stented segment. Dilatation was made up to 3.38 mm and these previously placed 3.0 mm stents.  Scout angiography confirmed an excellent angiographic result. For this reason, the decision was made not to add another stent.  The patient tolerated the procedure well.  He returned to his room in satisfactory condition.  HEMODYNAMIC DATA:  Central aortic pressure was 120/75.  Left ventricular pressure 120/11, post A-wave 20.  ANGIOGRAPHIC DATA:  Left main coronary artery was angiographically normal and trifurcated into an LAD, had intermediate vessel and a moderate-sized circumflex system.  The LAD had 50% smooth eccentric narrowing in its mid segment before the mid diagonal branch.  The LAD extended and wrapped around the LV apex.  The intermediate vessel was diminutive and small.  The circumflex vessel gave rise to proximal left atrial circumflex branch.  There was mild luminal irregularity of 20-30% beyond this prior to the stented segment.  There was 90% diffuse in-stent restenosis  in the proximally placed stent extending to the stent overlap with the stent placed from October with narrowing up to 95% at this site.  There was TIMI 3 flow.  The right coronary artery had smooth 20-30% proximal narrowing before the previously placed mid RCA stent which was patent.  Biplane cine left ventriculography revealed preserved global contractility with an EF of 60-65%.  On the RAO projection, there was mild upper mid anterolateral hypocontractility.  On the LAO projection, there was very small mid posterolateral hypocontractility.  Following coronary intervention to the left circumflex system utilizing cutting balloon arthrotomy with a 3.25 x 15 mm microsurgical dilatation catheter, the 90-95% diffuse in-stent restenosis was reduced to 0%. This stented area was dilated up to approximately 3.4 mm.  There was brisk TIMI 3 flow.  The stenosis was reduced to 0%.  There was no evidence for dissection.  IMPRESSION: 1. Preserved global LV contractility with mild upper mid anterolateral     and mid posterolateral hypocontractility with an ejection fraction     of 60-65%. 2. Multivessel coronary artery disease with smooth eccentric 50% mid-     LAD narrowing. 3. Diffuse 90-95% in-stent restenosis in the proximally placed     circumflex stent extending to the stent overlap to the mid     circumflex vessel; and 20-30% narrowing in the proximal RCA with no     significant in-stent restenosis in the mid RCA. 4. Successful cutting balloon arthrotomy in the circumflex coronary     artery with multiple dilatations performed with dilatation up to     3.38-3.4 mm in size with the 90-95% stenosis being reduced to 0%     with resultant brisk TIMI 3 flow and no evidence for dissection.     5.  Bivalirudin/Effient initiation/IVIC nitroglycerin.          ______________________________ Nicki Guadalajara, M.D.     TK/MEDQ  D:  08/25/2010  T:  08/25/2010  Job:  [id number]  cc:   [place] Hilty,  MD  Electronically Signed by Nicki Guadalajara M.D. on 09/01/2010 05:17:55 PM

## 2010-09-01 NOTE — H&P (Signed)
NAMEJOURDIN, [name]                ACCOUNT NO.:  [id number]  MEDICAL RECORD NO.:  [id number]           PATIENT TYPE:  I  LOCATION:  2923                         FACILITY:  [facility]  PHYSICIAN:  Nicki Guadalajara, M.D.     DATE OF BIRTH:  Dec 02, 1960  DATE OF ADMISSION:  08/23/2010 DATE OF DISCHARGE:                             HISTORY & PHYSICAL   CHIEF COMPLAINT:  Chest pressure.  HISTORY OF PRESENT ILLNESS:  Mr. [name] is a 50 year old African American male with a history of coronary artery disease status post three drug- eluting stents, two to the left circumflex and one to the RCA, was admitted on April 01, 2010, at which time he had a 70% in-stent restenosis of the distal portion of the proximal stent and a 20% in- stent restenosis of the distal stent in the left circumflex as well as 70% in-stent restenosis in the mid vessel stent to the RCA.  He had a cutting balloon atherectomy to the right coronary artery at that time and additional stent to the circumflex vessel.  He has a history of hypertension, diabetes, hyperlipidemia, history of alcohol and cocaine abuse, of which the patient states he has quit both 8 months ago as well as a history of tobacco use and he has also quit that 8 months ago.  The patient states that he developed substernal chest pressure, "discomfort" currently is a 4/10.  It has been lasting about a week now.  It has been intermittent.  He denies any shortness of breath, diaphoresis, headache, palpitations, vision change, cough and congestion, neck swelling or pain, abdominal pain, nausea, vomiting, lower extremity edema, dysuria, hematuria, hematochezia or melena, paresthesia, or muscle weakness.  CURRENT MEDICATIONS: 1. Acetaminophen 325 mg 2 tablets by mouth every 4 hours as needed for     pain. 2. Imdur 30 mg 1 tablet by mouth daily. 3. Coreg 3.125 mg 1 tablet twice daily. 4. Pepcid 20 mg tablets, 1 tablet by mouth twice daily. 5. Lovastatin 40 mg daily  at bedtime. 6. Lisinopril 30 mg 1 tablet daily. 7. Metformin 1000 mg 1 tablet by mouth twice daily. 8. Mirtazapine 15 mg 1 tablet by mouth daily at bedtime. 9. Multivitamin 1 tablet by mouth daily. 10.Nitroglycerin sublingual 0.4 mg tablets, 1 tablet every 5 minutes     for chest pain. 11.Plavix 75 mg 1 tablet by mouth daily.  ALLERGIES:  No known drug allergies.  PAST MEDICAL HISTORY: 1. Hypertension. 2. Diabetes mellitus type 2. 3. Hyperlipidemia. 4. History of alcohol use. 5. History of cocaine abuse. 6. Coronary artery disease status post drug-eluding stent to the     proximal and distal left circumflex as well as drug-eluting stent     to the mid RCA, this was in 2010.  He also received a Promus drug-     eluting stent in October 2011 to the left circumflex.  SOCIAL HISTORY:  The patient quit smoking, quit using cocaine and alcohol 8 months ago.  He has increased his exercise to daily, doing a lot of cardiovascular work at [employer].  He has also changed his eating habits to  include more fruits and vegetables.  FAMILY HISTORY:  Noncontributory.  REVIEW OF SYSTEMS:  As per HPI.  PHYSICAL EXAMINATION:  VITAL SIGNS:  Blood pressure 113/72, pulse 78, temperature 98.4, O2 sats 100% on room air, respiratory rate 18. GENERAL:  The patient is an [place] American male in no apparent distress. HEENT:  Pupils equal, round, reactive to light and accommodation. Extraocular movements intact.  Negative scleral icterus. NECK:  Supple, nontender without lymphadenopathy. CARDIOVASCULAR:  Heart is regular rate and rhythm.  S1 and S2.  Negative murmurs, rubs, or gallops. PULMONARY:  Lungs are clear to auscultation bilaterally.  Lung sounds are decreased.  Negative rhonchi or wheeze. ABDOMEN:  Positive bowel sounds in all four quadrants.  Soft, nontender. Negative abdominal masses or bruits. PERIPHERAL VASCULAR:  Dorsalis pedis pulses 2+ in the right and 1+ dorsalis pedis in the left, 2+  for radial pulses.  Negative lower extremity edema.  Negative carotid or femoral bruits bilaterally. NEUROLOGIC:  The patient is alert and oriented x3.  Strength is 5/5, equal in all extremities.  LABORATORY DATA:  WBCs 5.0, hemoglobin 13.6, hematocrit 39.6, platelets 257,000.  PT 13.3, INR 0.99.  Sodium 138, potassium 3.9, chloride 104, carbon dioxide 27, glucose 97, BUN 10, creatinine 0.96.  Total bilirubin 0.6, alk phos 50, AST 36, ALT 34.  Total protein 6.6, albumin 4.4, calcium 9.4, creatine kinase total was 774.  CK-MB is 11.9, relative index is 1.5, and troponin is 0.01.  CHEST X-RAY:  Heart size and vascularity are normal, and the lungs are clear.  No osseous abnormality.  Normal chest.  IMPRESSION: 1. Chest pain, rule out unstable angina. 2. Diabetes mellitus, type 2. 3. Hypertension.  PLAN:  [name] will be admitted to step-down due to continued chest pain.  He was started on IV nitroglycerin and heparin.  We will increase his Coreg to 6.25 mg b.i.d.  Continue his current home medications and start him on aspirin 81 mg daily.  Plans to do cardiac catheterization on Monday at some point.  We will hold his lisinopril at that time, hold his metformin as well and put him on sliding scale insulin with a.c. and h.s. glucose checks.  He is to have a heart-healthy diet. We will check his cardiac enzymes x3 q.8 h. as well as check his fasting lipids, hemoglobin A1c, and thyroid-stimulating hormone.  We will also recheck EKG in the morning of August 24, 2010.    ______________________________ Wilburt Finlay, PA   ______________________________ Nicki Guadalajara, M.D.    BH/MEDQ  D:  08/23/2010  T:  08/24/2010  Job:  [id number]  Electronically Signed by Wilburt Finlay PA on 08/27/2010 04:20:19 PM Electronically Signed by Nicki Guadalajara M.D. on 09/01/2010 05:17:51 PM

## 2010-09-03 ENCOUNTER — Encounter (HOSPITAL_COMMUNITY): Payer: Self-pay

## 2010-09-05 ENCOUNTER — Encounter (HOSPITAL_COMMUNITY): Payer: Self-pay

## 2010-09-05 NOTE — Discharge Summary (Signed)
  NAMEBETZALEL, UMBARGER                ACCOUNT NO.:  [id number]  MEDICAL RECORD NO.:  [id number]           PATIENT TYPE:  I  LOCATION:  2923                         FACILITY:  [facility]  PHYSICIAN:  [place] Hilty, MD         DATE OF BIRTH:  [birth year]  DATE OF ADMISSION:  08/23/2010 DATE OF DISCHARGE:  08/26/2010                              DISCHARGE SUMMARY   DATE OF DISCHARGE:  Anticipated to be August 26, 2010.  DISCHARGE DIAGNOSES: 1. Unstable angina, cardiac catheterization and cutting balloon     intervention for in-stent restenosis to the previously placed     circumflex stent this admission. 2. Coronary artery disease with 2 stents to the circumflex and a stent     to the RCA in June 2010.  New stent to the circumflex October 2011     with cutting balloon to the RCA October 2011. 3. Preserved left ventricular function. 4. Type 2 non-insulin-dependent diabetes. 5. Treated dyslipidemia. 6. Treated hypertension. 7. Ex-smoker. 8. Prior history of heavy alcohol abuse and cocaine use, the patient     did undergo detoxing in August 2011 and has been doing well from     that standpoint since.  Mr. Chausse is a 50 year old male with coronary artery disease.  He had several MIs going back to 2003.  He had intervention we think in [place] in June or July 2010 with I believe circumflex and RCA stents. He was seen by [place] in October 2011 when he presented with unstable angina.  Catheterization at that time was done and he underwent stenting to the circumflex and a cutting balloon for in-stent restenosis to the RCA.  He is admitted again August 23, 2010, with unstable angina.  He was admitted to telemetry, his CK-MBs were elevated slightly, but his relative index and troponins were negative.  His symptoms were typical for angina and he underwent catheterization and subsequent intervention to the circumflex for in-stent restenosis with a cutting balloon.  He tolerated this well.  Pending any  other complications, we feel he can be discharged on August 26, 2010.  LABORATORY DATA:  Cholesterol is 126, HDL 35, LDL of 77.  TSH 1.77. Hemoglobin A1c is 5.6.  Liver functions are normal.  Sodium 140, potassium 4.2, BUN 9, creatinine 0.79.  White count 4.2, hemoglobin 13.2, hematocrit 40.2, platelets 232.  Chest x-ray shows normal chest. EKG shows sinus rhythm without acute changes.  DISPOSITION:  The patient will be discharged August 26, 2010, stable. He will follow up with Dr. Rennis Golden as an outpatient.     Abelino Derrick, P.A.   ______________________________ [place] Hilty, MD    LKK/MEDQ  D:  08/25/2010  T:  08/26/2010  Job:  [id number]  Electronically Signed by Corine Shelter P.A. on 09/05/2010 11:28:54 AM Electronically Signed by K. HILTY M.D. on 09/05/2010 02:49:26 PM

## 2010-09-08 ENCOUNTER — Encounter (HOSPITAL_COMMUNITY): Payer: Self-pay

## 2010-09-08 LAB — RAPID STREP SCREEN (MED CTR MEBANE ONLY): Streptococcus, Group A Screen (Direct): NEGATIVE

## 2010-09-09 LAB — GLUCOSE, CAPILLARY
Glucose-Capillary: 101 mg/dL — ABNORMAL HIGH (ref 70–99)
Glucose-Capillary: 110 mg/dL — ABNORMAL HIGH (ref 70–99)
Glucose-Capillary: 111 mg/dL — ABNORMAL HIGH (ref 70–99)

## 2010-09-10 ENCOUNTER — Encounter (HOSPITAL_COMMUNITY): Payer: Self-pay

## 2010-09-10 LAB — LIPID PANEL
Cholesterol: 84 mg/dL (ref 0–200)
LDL Cholesterol: 35 mg/dL (ref 0–99)
VLDL: 14 mg/dL (ref 0–40)

## 2010-09-10 LAB — COMPREHENSIVE METABOLIC PANEL
BUN: 10 mg/dL (ref 6–23)
CO2: 27 mEq/L (ref 19–32)
Calcium: 9.4 mg/dL (ref 8.4–10.5)
Creatinine, Ser: 0.88 mg/dL (ref 0.4–1.5)
GFR calc non Af Amer: >60 mL/min (ref >60)
Glucose, Bld: 132 mg/dL — ABNORMAL HIGH (ref 70–99)
Total Protein: 6.7 g/dL (ref 6.0–8.3)

## 2010-09-10 LAB — CBC
HCT: 38.7 % — ABNORMAL LOW (ref 39.0–52.0)
HCT: 40.4 % (ref 39.0–52.0)
Hemoglobin: 13.6 g/dL (ref 13.0–17.0)
MCH: 28.3 pg (ref 26.0–34.0)
MCH: 28.5 pg (ref 26.0–34.0)
MCHC: 33.3 g/dL (ref 30.0–36.0)
MCHC: 33.7 g/dL (ref 30.0–36.0)
RDW: 12 % (ref 11.5–15.5)
RDW: 12 % (ref 11.5–15.5)

## 2010-09-10 LAB — GLUCOSE, CAPILLARY
Glucose-Capillary: 138 mg/dL — ABNORMAL HIGH (ref 70–99)
Glucose-Capillary: 159 mg/dL — ABNORMAL HIGH (ref 70–99)
Glucose-Capillary: 288 mg/dL — ABNORMAL HIGH (ref 70–99)
Glucose-Capillary: 294 mg/dL — ABNORMAL HIGH (ref 70–99)

## 2010-09-10 LAB — BASIC METABOLIC PANEL
BUN: 11 mg/dL (ref 6–23)
CO2: 26 mEq/L (ref 19–32)
GFR calc non Af Amer: >60 mL/min (ref >60)
Glucose, Bld: 245 mg/dL — ABNORMAL HIGH (ref 70–99)
Potassium: 3.7 mEq/L (ref 3.5–5.1)

## 2010-09-10 LAB — CARDIAC PANEL(CRET KIN+CKTOT+MB+TROPI)
CK, MB: 4.3 ng/mL — ABNORMAL HIGH (ref 0.3–4.0)
Relative Index: 0.9 (ref 0.0–2.5)
Total CK: 506 U/L — ABNORMAL HIGH (ref 7–232)
Troponin I: 0.02 ng/mL (ref 0.00–0.06)

## 2010-09-10 LAB — MAGNESIUM: Magnesium: 2 mg/dL (ref 1.5–2.5)

## 2010-09-11 LAB — POCT I-STAT, CHEM 8
BUN: 13 mg/dL (ref 6–23)
Chloride: 102 mEq/L (ref 96–112)
Creatinine, Ser: 0.7 mg/dL (ref 0.4–1.5)
Glucose, Bld: 195 mg/dL — ABNORMAL HIGH (ref 70–99)
Potassium: 3.9 mEq/L (ref 3.5–5.1)
Sodium: 139 mEq/L (ref 135–145)

## 2010-09-11 LAB — LIPID PANEL
Cholesterol: 83 mg/dL (ref 0–200)
HDL: 28 mg/dL — ABNORMAL LOW (ref >39)
LDL Cholesterol: 24 mg/dL (ref 0–99)
Total CHOL/HDL Ratio: 3 RATIO

## 2010-09-11 LAB — GLUCOSE, CAPILLARY

## 2010-09-11 LAB — RAPID URINE DRUG SCREEN, HOSP PERFORMED
Amphetamines: NOT DETECTED
Barbiturates: NOT DETECTED
Benzodiazepines: NOT DETECTED
Opiates: NOT DETECTED
Tetrahydrocannabinol: NOT DETECTED

## 2010-09-11 LAB — CBC
Hemoglobin: 14.5 g/dL (ref 13.0–17.0)
MCH: 29.9 pg (ref 26.0–34.0)
MCV: 87.9 fL (ref 78.0–100.0)
RBC: 4.85 MIL/uL (ref 4.22–5.81)
WBC: 5.5 10*3/uL (ref 4.0–10.5)

## 2010-09-11 LAB — HEMOGLOBIN A1C: Mean Plasma Glucose: 151 mg/dL — ABNORMAL HIGH (ref <117)

## 2010-09-11 LAB — CARDIAC PANEL(CRET KIN+CKTOT+MB+TROPI)
CK, MB: 3.9 ng/mL (ref 0.3–4.0)
CK, MB: 4.3 ng/mL — ABNORMAL HIGH (ref 0.3–4.0)
CK, MB: 4.4 ng/mL — ABNORMAL HIGH (ref 0.3–4.0)
Relative Index: 0.9 (ref 0.0–2.5)
Total CK: 482 U/L — ABNORMAL HIGH (ref 7–232)
Total CK: 514 U/L — ABNORMAL HIGH (ref 7–232)
Troponin I: 0.02 ng/mL (ref 0.00–0.06)

## 2010-09-11 LAB — DIFFERENTIAL
Eosinophils Absolute: 0.1 10*3/uL (ref 0.0–0.7)
Eosinophils Relative: 2 % (ref 0–5)
Lymphocytes Relative: 36 % (ref 12–46)
Lymphs Abs: 1.9 10*3/uL (ref 0.7–4.0)
Monocytes Relative: 8 % (ref 3–12)
Neutrophils Relative %: 53 % (ref 43–77)

## 2010-09-11 LAB — BASIC METABOLIC PANEL
BUN: 10 mg/dL (ref 6–23)
Calcium: 9.5 mg/dL (ref 8.4–10.5)
GFR calc non Af Amer: >60 mL/min (ref >60)
Glucose, Bld: 127 mg/dL — ABNORMAL HIGH (ref 70–99)
Potassium: 3.9 mEq/L (ref 3.5–5.1)
Sodium: 140 mEq/L (ref 135–145)

## 2010-09-11 LAB — POCT CARDIAC MARKERS
CKMB, poc: 1.3 ng/mL (ref 1.0–8.0)
Myoglobin, poc: 83.2 ng/mL (ref 12–200)
Troponin i, poc: <0.05 ng/mL (ref 0.00–0.09)

## 2010-09-11 LAB — ETHANOL: Alcohol, Ethyl (B): 126 mg/dL — ABNORMAL HIGH (ref 0–10)

## 2010-09-12 ENCOUNTER — Encounter (HOSPITAL_COMMUNITY): Payer: Self-pay

## 2010-09-12 LAB — URINALYSIS, ROUTINE W REFLEX MICROSCOPIC
Bilirubin Urine: NEGATIVE
Glucose, UA: NEGATIVE mg/dL
Hgb urine dipstick: NEGATIVE
Protein, ur: NEGATIVE mg/dL
Urobilinogen, UA: 1 mg/dL (ref 0.0–1.0)

## 2010-09-12 LAB — HEPATIC FUNCTION PANEL
AST: 23 U/L (ref 0–37)
Albumin: 3.8 g/dL (ref 3.5–5.2)
Alkaline Phosphatase: 52 U/L (ref 39–117)
Bilirubin, Direct: 0.1 mg/dL (ref 0.0–0.3)
Total Bilirubin: 0.4 mg/dL (ref 0.3–1.2)

## 2010-09-12 LAB — GLUCOSE, CAPILLARY
Glucose-Capillary: 100 mg/dL — ABNORMAL HIGH (ref 70–99)
Glucose-Capillary: 125 mg/dL — ABNORMAL HIGH (ref 70–99)
Glucose-Capillary: 153 mg/dL — ABNORMAL HIGH (ref 70–99)
Glucose-Capillary: 176 mg/dL — ABNORMAL HIGH (ref 70–99)

## 2010-09-12 LAB — HEMOGLOBIN A1C: Mean Plasma Glucose: 128 mg/dL — ABNORMAL HIGH (ref <117)

## 2010-09-12 LAB — URINE MICROSCOPIC-ADD ON

## 2010-09-12 LAB — TSH: TSH: 0.72 u[IU]/mL (ref 0.350–4.500)

## 2010-09-15 ENCOUNTER — Encounter (HOSPITAL_COMMUNITY): Payer: Self-pay

## 2010-09-17 ENCOUNTER — Encounter (HOSPITAL_COMMUNITY): Payer: Self-pay

## 2010-09-19 ENCOUNTER — Encounter (HOSPITAL_COMMUNITY): Payer: Self-pay

## 2010-09-21 LAB — POCT I-STAT, CHEM 8
BUN: <3 mg/dL — ABNORMAL LOW (ref 6–23)
Chloride: 107 mEq/L (ref 96–112)
Creatinine, Ser: 0.9 mg/dL (ref 0.4–1.5)
Potassium: 3.4 mEq/L — ABNORMAL LOW (ref 3.5–5.1)
Sodium: 144 mEq/L (ref 135–145)

## 2010-09-21 LAB — CBC
Platelets: 301 10*3/uL (ref 150–400)
RDW: 13.5 % (ref 11.5–15.5)
WBC: 13.2 10*3/uL — ABNORMAL HIGH (ref 4.0–10.5)

## 2010-09-21 LAB — URINALYSIS, ROUTINE W REFLEX MICROSCOPIC
Bilirubin Urine: NEGATIVE
Glucose, UA: NEGATIVE mg/dL
Nitrite: NEGATIVE
Protein, ur: NEGATIVE mg/dL
pH: 5.5 (ref 5.0–8.0)

## 2010-09-21 LAB — DIFFERENTIAL
Basophils Absolute: 0.1 10*3/uL (ref 0.0–0.1)
Lymphocytes Relative: 21 % (ref 12–46)
Lymphs Abs: 2.7 10*3/uL (ref 0.7–4.0)
Neutro Abs: 9.4 10*3/uL — ABNORMAL HIGH (ref 1.7–7.7)

## 2010-09-21 LAB — RAPID URINE DRUG SCREEN, HOSP PERFORMED
Benzodiazepines: NOT DETECTED
Cocaine: POSITIVE — AB
Tetrahydrocannabinol: NOT DETECTED

## 2010-09-21 LAB — ETHANOL: Alcohol, Ethyl (B): 87 mg/dL — ABNORMAL HIGH (ref 0–10)

## 2010-09-21 LAB — GLUCOSE, CAPILLARY
Glucose-Capillary: 152 mg/dL — ABNORMAL HIGH (ref 70–99)
Glucose-Capillary: 174 mg/dL — ABNORMAL HIGH (ref 70–99)

## 2010-09-22 ENCOUNTER — Encounter (HOSPITAL_COMMUNITY): Payer: Self-pay

## 2010-09-22 LAB — HEPATIC FUNCTION PANEL
AST: 47 U/L — ABNORMAL HIGH (ref 0–37)
Bilirubin, Direct: 0.1 mg/dL (ref 0.0–0.3)
Indirect Bilirubin: 0.1 mg/dL — ABNORMAL LOW (ref 0.3–0.9)
Total Bilirubin: 0.2 mg/dL — ABNORMAL LOW (ref 0.3–1.2)

## 2010-09-22 LAB — GLUCOSE, CAPILLARY
Glucose-Capillary: 115 mg/dL — ABNORMAL HIGH (ref 70–99)
Glucose-Capillary: 126 mg/dL — ABNORMAL HIGH (ref 70–99)
Glucose-Capillary: 147 mg/dL — ABNORMAL HIGH (ref 70–99)
Glucose-Capillary: 149 mg/dL — ABNORMAL HIGH (ref 70–99)
Glucose-Capillary: 157 mg/dL — ABNORMAL HIGH (ref 70–99)
Glucose-Capillary: 92 mg/dL (ref 70–99)
Glucose-Capillary: 99 mg/dL (ref 70–99)

## 2010-09-24 ENCOUNTER — Encounter (HOSPITAL_COMMUNITY): Payer: Self-pay

## 2010-09-26 ENCOUNTER — Encounter (HOSPITAL_COMMUNITY): Payer: Self-pay

## 2010-09-29 ENCOUNTER — Encounter (HOSPITAL_COMMUNITY): Payer: Self-pay

## 2010-10-01 ENCOUNTER — Encounter (HOSPITAL_COMMUNITY): Payer: Self-pay

## 2010-10-03 ENCOUNTER — Encounter (HOSPITAL_COMMUNITY): Payer: Self-pay

## 2010-10-06 ENCOUNTER — Encounter (HOSPITAL_COMMUNITY): Payer: Self-pay

## 2010-10-08 ENCOUNTER — Encounter (HOSPITAL_COMMUNITY): Payer: Self-pay

## 2010-10-10 ENCOUNTER — Encounter (HOSPITAL_COMMUNITY): Payer: Self-pay

## 2010-10-13 ENCOUNTER — Encounter (HOSPITAL_COMMUNITY): Payer: Self-pay

## 2010-10-15 ENCOUNTER — Encounter (HOSPITAL_COMMUNITY): Payer: Self-pay

## 2010-10-17 ENCOUNTER — Encounter (HOSPITAL_COMMUNITY): Payer: Self-pay

## 2010-10-20 ENCOUNTER — Encounter (HOSPITAL_COMMUNITY): Payer: Self-pay

## 2010-11-14 NOTE — Discharge Summary (Signed)
[name], [name]                          ACCOUNT NO.:  [id number]   MEDICAL RECORD NO.:  [id number]                   PATIENT TYPE:  INP   LOCATION:  2014                                 FACILITY:  [facility]   PHYSICIAN:  Rollene Rotunda, M.D. [facility]            DATE OF BIRTH:  16-Feb-1961   DATE OF ADMISSION:  06/12/2002  DATE OF DISCHARGE:  06/16/2002                           DISCHARGE SUMMARY - REFERRING   BRIEF HISTORY:  This is a 50 year old male with no prior cardiac history,  who was admitted to [facility] on June 12, 2002 for evaluation  of chest pain.  He was seen by Dr. Rollene Rotunda and admitted for further  evaluation.   PAST MEDICAL HISTORY:  Past medical history is significant for hypertension,  treated with diuretic; history of plantar fasciitis; history of substance  abuse including tobacco, alcohol and cocaine.   ALLERGIES:  No known drug allergies.  The patient is allergic to BEE STINGS  and SHELLFISH.   MEDICATIONS:  Medications prior to admission included a diuretic.   SOCIAL HISTORY:  The patient lives in [place] in a shelter.  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  He is married.  He smokes less than one pack of cigarettes per day.  He drinks two to three beers per week.   FAMILY HISTORY:  Family history is noncontributory for early coronary artery  disease.   HOSPITAL COURSE:  As noted, this patient was admitted to [facility]  for further evaluation of chest pain.  He ruled out for an MI.   An exercise Cardiolite was attempted on June 14, 2002, however, it was  stopped secondary to severe chest pain.  The decision was made to proceed  with cardiac catheterization.   The patient underwent cardiac catheterization on June 15, 2002,  performed by Dr. Antoine Poche.  He was found to have a 40% proximal LAD.  There  were three small diagonal vessels.  The circumflex had a  large mid obtuse  marginal with diffuse 25% lesions.  The right coronary artery was a small  dominant vessel that had a 25% midstenosis.  The ejection fraction was 65%.  The wall motion of the left ventricle was normal.  The patient was felt to  have nonobstructive coronary artery disease and conservative therapy was  recommended.  Arrangements were made to discharge the patient home on  June 16, 2002.   LABORATORY DATA:  Hemoglobin was 16, hematocrit 47 on June 12, 2002.  A  chemistry profile on June 15, 2002 revealed BUN 17, creatinine 0.9,  potassium 4.4, glucose 153.  The patient's potassium was initially low at  3.2.  An INR was 1.0.  PTT was 32.  The CK was 1631 with an MB of 13.  Troponin I was 0.02.  The patient had mildly elevated liver function tests.  A lipid profile revealed cholesterol 164, triglycerides 137, HDL was 43, LDL  was 94.  A drug screen was positive for opiates and cocaine.   A chest x-ray showed no active disease and no evidence of rib fractures.   DISCHARGE MEDICATIONS:  The patient was discharged on:  1. Hydrochlorothiazide 12.5 mg daily.  2. Enteric-coated aspirin 81 mg daily.   ACTIVITY:  The patient was told to avoid any strenuous activity or driving  for two days.   DIET:  He was to be on a low-salt, low-fat diet.   SPECIAL DISCHARGE INSTRUCTIONS:  He was told to call the [place] office if  he had any increased pain, swelling or bleeding from his groin.   FOLLOWUP:  He was to have lab work at his next office visit with the  Internal Medicine Service and he was to follow up with Dr. Laurell Roof.   PROBLEM LIST AT TIME OF DISCHARGE:  1. Chest pain, felt to be noncardiac.  2. Cardiac catheterization, June 15, 2002, showing nonobstructive     coronary artery disease with normal left ventricle.  3. Hypertension.  4. Plantar fasciitis.  5. History of cocaine use.  6. History of tobacco use.  7. History of alcohol use.  8. History  of mildly elevated liver function tests.  9. Hypokalemia, corrected.  10.      Exercise Cardiolite performed June 04, 2002, stopped secondary     to chest pain.     Delton See, P.A. [facility]                  Rollene Rotunda, M.D. [facility]    DR/MEDQ  D:  06/16/2002  T:  06/17/2002  Job:  [id number]   cc:   [facility]

## 2010-11-14 NOTE — Cardiovascular Report (Signed)
[name], [name]                          ACCOUNT NO.:  [id number]   MEDICAL RECORD NO.:  [id number]                   PATIENT TYPE:  INP   LOCATION:  2014                                 FACILITY:  [facility]   PHYSICIAN:  Rollene Rotunda, M.D. [facility]            DATE OF BIRTH:  1961/05/02   DATE OF PROCEDURE:  06/15/2002  DATE OF DISCHARGE:                              CARDIAC CATHETERIZATION   PROCEDURE:  Left heart catheterization/coronary arteriography.   INDICATIONS:  Evaluate patient with abnormal ECG and chest pain.   DESCRIPTION OF PROCEDURE:  Left heart catheterization was performed via the  right femoral artery. The artery was cannulated using the anterior wall  puncture. A #6 French arterial sheath was inserted via the modified  Seldinger technique. Preformed Judkins and a pigtail catheter were utilized.  The patient tolerated the procedure well and left the lab in stable  condition.   HEMODYNAMICS:  LV 141/16, AO 159/92.   CORONARY ARTERIES:  The left main was normal.   The LAD had a proximal 40% stenosis.  There was three small diagonal  vessels.   The circumflex had a large mid obtuse marginal with diffuse 25% lesions.  The vessel was somewhat small in the AV groove and free of high-grade  disease.   The right coronary artery was a small dominant vessel.  It had long mid 25%  stenosis.   LEFT VENTRICULOGRAM:  The left ventriculogram was obtained in the RAO  projection. The EF was 65% with normal wall motion.   Aortic root:  An aortic root was obtained secondary to chest pain to rule  out dissection and was normal.  There was no evidence of aortic  insufficiency.   CONCLUSION:  Nonobstructive coronary artery disease.  Normal left  ventricular function.   PLAN:  I had a very long discussion with the patient regarding the  significance of a 40% proximal LAD lesion. He understands he needs  aggressive lifestyle modification to avoid future cardiovascular  events.  We  discussed statin use.  He understands that the use of a statin would be not  yet suggested by NCEP guide lines.  He understands the risks and benefits of  statins.  He understands the side effects including there is a small chance  of elevated liver enzymes and possibility of myositis.  He very much wants  to start statin therapy. His liver enzymes were very slightly elevated on  admission, probably related to substance use.  He admits to drinking a  little.  He does use cocaine.  However, with the precautions stated above, I  would start a statin after repeat liver enzymes if they  normalized.  He also understands that cocaine can cause a spasm and  myocardial infarction.  He has plans for followup with our Teaching Service  at [facility] and understands the need for periodic blood work on  statins.  Rollene Rotunda, M.D. [facility]    JH/MEDQ  D:  06/15/2002  T:  06/15/2002  Job:  [id number]   cc:   Laurell Roof, M.D.  Int Medicine Resident - [address]  [place], [place] [address]  Fax: [phone]

## 2010-11-14 NOTE — H&P (Signed)
[name], [name]                          ACCOUNT NO.:  [id number]   MEDICAL RECORD NO.:  [id number]                   PATIENT TYPE:  EMS   LOCATION:  [facility]                                 FACILITY:  [facility]   PHYSICIAN:  Rollene Rotunda, M.D. [facility]            DATE OF BIRTH:  1960-07-16   DATE OF ADMISSION:  06/12/2002  DATE OF DISCHARGE:                                HISTORY & PHYSICAL   PRIMARY CARE PHYSICIAN:  None.   REASON FOR PRESENTATION:  Evaluate patient with chest pain.   HISTORY OF PRESENT ILLNESS:  The patient is a pleasant 50 year old African  American gentleman with no prior cardiac history.  He had been in his usual  state of health until yesterday at 8 p.m.  He developed chest discomfort  while at rest.  This was a sharp discomfort.  It has been constant.  It is  in the middle of his sternum.  It is reproducible with palpation, movement  and deep breathing.  He had this all night long.  He had some difficulty  sleeping because he would have discomfort with certain positions.  He did  not have nausea, vomiting or diaphoresis.  He had no radiation of the  discomfort to his neck or to his arms.  He did feel like it was difficult to  breathe only because taking a short breath was somewhat problematic.  He  denies any fevers or chills.  He has had no cough.  He had no PND or  orthopnea.  He has had no palpitations.  He has not done anything unusual  recently or had any usual activities.  He has never had this discomfort  before.  He has otherwise been in his usual state of health and is able to  work out at [employer] without problems.   PAST MEDICAL HISTORY:  Hypertension, treated with diuretics recently;  plantar fasciitis.   PAST SURGICAL HISTORY:  Tonsillectomy.   ALLERGIES:  No drug allergies.  He is allergic to BEE STINGS and SHELLFISH  causes eye swelling.   MEDICATIONS:  Diuretic (he does not know what type or dose).   SOCIAL HISTORY:  The patient lives in  [place].  He is currently living in  a shelter.  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  He is married.  He smokes less than one pack  of cigarettes per day.  He drinks two to three beers per week.   FAMILY HISTORY:  Family history is noncontributory for early coronary artery  disease.   REVIEW OF SYSTEMS:  Positive for headaches, status post nitroglycerin;  bilateral knee pain.  Otherwise as stated in HPI and negative for other  systems.   PHYSICAL EXAMINATION:  GENERAL:  The patient is in no distress.  VITAL SIGNS:  Blood pressure 114/69, heart rate 74 and regular, afebrile.  HEENT:  Eyes unremarkable; pupils equal, round and reactive to light; fundi  not visualized.  Oral mucosa unremarkable.  NECK:  No jugular venous distention, wave form within normal limits, carotid  upstroke brisk and symmetric, no bruits, no thyromegaly.  LYMPHATICS:  No cervical, axillary or inguinal adenopathy.  LUNGS:  Lungs clear to auscultation bilaterally.  BACK:  No costovertebral angle tenderness.  CHEST:  Tenderness to palpation in the mid-sternum, this is exactly  reproducible of his presenting complaint, he has the same discomfort when he  moves his arms or changes position from a lying to sitting position.  HEART:  PMI not displaced or sustained, S1 and S2 within normal limits, no  S3, no S4, no murmurs, no rubs.  ABDOMEN:  Abdomen flat, positive bowel sounds, normal in frequency and  pitch; no bruits, rebound, guarding, midline pulsatile mass, hepatomegaly,  splenomegaly.  SKIN:  No rash, no nodules.  EXTREMITIES:  Pulses 2+ throughout, no edema.  NEUROLOGIC:  Oriented to person, place and time; cranial nerves II-XII  grossly intact; motor intact throughout.   LABORATORY AND ACCESSORY CLINICAL DATA:  EKG:  Sinus rhythm, rate 67, axis  within normal limits, intervals within normal limits, inferior T wave  inversions with T wave  inversion in V4 through V6 as well.  Could not  exclude ischemia versus repolarization changes secondary to LVH.   WBC 8.1, hemoglobin 14.7; sodium 139, potassium 3.2, BUN 20, creatinine 1.0;  CK 2091, MB 19.6, troponin 0.03.   Chest x-ray:  Poor inspiratory effort, no obvious airspace disease,  questionable cardiomegaly.   ASSESSMENT AND PLAN:  1. Chest:  The patient's chest discomfort is reproducible with palpation of     the sternum.  It is reproducible with movement.  It is accompanied by an     elevated CK with normal MB and troponin.  This is clearly     musculoskeletal.  The reason for this is now clear.  Because the patient     is quite uncomfortable, I will admit him and treat him with anti-     inflammatories.  We will cycle his CKs to make sure they are coming down.     We will repeat electrocardiograms to make sure these are unchanged as his     pain is improved.  This would help further to suggest repolarization     abnormalities.  Provided his enzymes are normalized and his pain response     to anti-inflammatories and his electrocardiograms are unchanged, I would     not suggest further cardiovascular testing.  I would like to get a chest     x-ray, PA and lateral, in the a.m. with a better inspiratory effort.  2. Hypertension:  The patient will remain on a diuretic.  3. Tobacco:  He will be educated about the need to stop smoking.                                               Rollene Rotunda, M.D. [facility]    JH/MEDQ  D:  06/12/2002  T:  06/12/2002  Job:  [id number]

## 2011-01-23 ENCOUNTER — Inpatient Hospital Stay (HOSPITAL_COMMUNITY)
Admission: EM | Admit: 2011-01-23 | Discharge: 2011-01-27 | DRG: 247 | Disposition: A | Discharge status: Home / Self Care | Payer: Medicaid Other | Attending: Internal Medicine | Admitting: Internal Medicine

## 2011-01-23 ENCOUNTER — Emergency Department (HOSPITAL_COMMUNITY): Payer: Medicaid Other

## 2011-01-23 DIAGNOSIS — I200 Unstable angina: Secondary | ICD-10-CM | POA: Diagnosis present

## 2011-01-23 DIAGNOSIS — Y831 Surgical operation with implant of artificial internal device as the cause of abnormal reaction of the patient, or of later complication, without mention of misadventure at the time of the procedure: Secondary | ICD-10-CM | POA: Diagnosis present

## 2011-01-23 DIAGNOSIS — I2510 Atherosclerotic heart disease of native coronary artery without angina pectoris: Secondary | ICD-10-CM | POA: Diagnosis present

## 2011-01-23 DIAGNOSIS — I252 Old myocardial infarction: Secondary | ICD-10-CM

## 2011-01-23 DIAGNOSIS — Z7902 Long term (current) use of antithrombotics/antiplatelets: Secondary | ICD-10-CM

## 2011-01-23 DIAGNOSIS — Z79899 Other long term (current) drug therapy: Secondary | ICD-10-CM

## 2011-01-23 DIAGNOSIS — E119 Type 2 diabetes mellitus without complications: Secondary | ICD-10-CM | POA: Diagnosis present

## 2011-01-23 DIAGNOSIS — I10 Essential (primary) hypertension: Secondary | ICD-10-CM | POA: Diagnosis present

## 2011-01-23 DIAGNOSIS — E7800 Pure hypercholesterolemia, unspecified: Secondary | ICD-10-CM | POA: Diagnosis present

## 2011-01-23 DIAGNOSIS — Z7982 Long term (current) use of aspirin: Secondary | ICD-10-CM

## 2011-01-23 DIAGNOSIS — T82897A Other specified complication of cardiac prosthetic devices, implants and grafts, initial encounter: Principal | ICD-10-CM | POA: Diagnosis present

## 2011-01-23 LAB — COMPREHENSIVE METABOLIC PANEL
ALT: 27 U/L (ref 0–53)
AST: 23 U/L (ref 0–37)
Albumin: 4.1 g/dL (ref 3.5–5.2)
Alkaline Phosphatase: 53 U/L (ref 39–117)
Chloride: 103 mEq/L (ref 96–112)
Potassium: 4.2 mEq/L (ref 3.5–5.1)
Sodium: 140 mEq/L (ref 135–145)
Total Protein: 6.9 g/dL (ref 6.0–8.3)

## 2011-01-23 LAB — CBC
HCT: 41 % (ref 39.0–52.0)
Hemoglobin: 14 g/dL (ref 13.0–17.0)
MCV: 81.3 fL (ref 78.0–100.0)
WBC: 6.2 10*3/uL (ref 4.0–10.5)

## 2011-01-23 LAB — MRSA PCR SCREENING: MRSA by PCR: NEGATIVE

## 2011-01-23 LAB — DIFFERENTIAL
Basophils Absolute: 0 10*3/uL (ref 0.0–0.1)
Eosinophils Absolute: 0.2 10*3/uL (ref 0.0–0.7)
Eosinophils Relative: 3 % (ref 0–5)
Monocytes Absolute: 0.5 10*3/uL (ref 0.1–1.0)
Monocytes Relative: 9 % (ref 3–12)
Neutrophils Relative %: 45 % (ref 43–77)

## 2011-01-23 LAB — CK TOTAL AND CKMB (NOT AT ARMC)
CK, MB: 6 ng/mL — ABNORMAL HIGH (ref 0.3–4.0)
Relative Index: 1.3 (ref 0.0–2.5)

## 2011-01-23 LAB — TROPONIN I: Troponin I: <0.3 ng/mL (ref <0.30)

## 2011-01-24 LAB — GLUCOSE, CAPILLARY
Glucose-Capillary: 176 mg/dL — ABNORMAL HIGH (ref 70–99)
Glucose-Capillary: 182 mg/dL — ABNORMAL HIGH (ref 70–99)
Glucose-Capillary: 106 mg/dL — ABNORMAL HIGH (ref 70–99)
Glucose-Capillary: 143 mg/dL — ABNORMAL HIGH (ref 70–99)
Glucose-Capillary: 131 mg/dL — ABNORMAL HIGH (ref 70–99)

## 2011-01-24 LAB — CBC
Hemoglobin: 13 g/dL (ref 13.0–17.0)
MCH: 27.2 pg (ref 26.0–34.0)
MCHC: 33.2 g/dL (ref 30.0–36.0)
RDW: 12.6 % (ref 11.5–15.5)

## 2011-01-24 LAB — CARDIAC PANEL(CRET KIN+CKTOT+MB+TROPI)
CK, MB: 4.6 ng/mL — ABNORMAL HIGH (ref 0.3–4.0)
Relative Index: 1.4 (ref 0.0–2.5)
Troponin I: <0.3 ng/mL (ref <0.30)
Troponin I: <0.3 ng/mL (ref <0.30)

## 2011-01-24 LAB — HEPARIN LEVEL (UNFRACTIONATED)
Heparin Unfractionated: 0.4 IU/mL (ref 0.30–0.70)
Heparin Unfractionated: 0.36 IU/mL (ref 0.30–0.70)

## 2011-01-24 LAB — HEMOGLOBIN A1C: Hgb A1c MFr Bld: 6.2 % — ABNORMAL HIGH (ref <5.7)

## 2011-01-25 LAB — CBC
HCT: 38 % — ABNORMAL LOW (ref 39.0–52.0)
MCHC: 33.2 g/dL (ref 30.0–36.0)
Platelets: 226 10*3/uL (ref 150–400)
RDW: 12.7 % (ref 11.5–15.5)
WBC: 5.1 10*3/uL (ref 4.0–10.5)

## 2011-01-25 LAB — GLUCOSE, CAPILLARY
Glucose-Capillary: 131 mg/dL — ABNORMAL HIGH (ref 70–99)
Glucose-Capillary: 116 mg/dL — ABNORMAL HIGH (ref 70–99)
Glucose-Capillary: 153 mg/dL — ABNORMAL HIGH (ref 70–99)

## 2011-01-25 LAB — LIPID PANEL
Cholesterol: 181 mg/dL (ref 0–200)
VLDL: 39 mg/dL (ref 0–40)

## 2011-01-26 HISTORY — PX: CARDIAC CATHETERIZATION: SHX172

## 2011-01-26 LAB — CBC
MCH: 27.6 pg (ref 26.0–34.0)
MCV: 82.4 fL (ref 78.0–100.0)
Platelets: 220 10*3/uL (ref 150–400)
RDW: 12.7 % (ref 11.5–15.5)
WBC: 5.8 10*3/uL (ref 4.0–10.5)

## 2011-01-26 LAB — GLUCOSE, CAPILLARY
Glucose-Capillary: 219 mg/dL — ABNORMAL HIGH (ref 70–99)
Glucose-Capillary: 249 mg/dL — ABNORMAL HIGH (ref 70–99)
Glucose-Capillary: 284 mg/dL — ABNORMAL HIGH (ref 70–99)

## 2011-01-26 LAB — PLATELET INHIBITION P2Y12: P2Y12 % Inhibition: 85 %

## 2011-01-27 LAB — GLUCOSE, CAPILLARY: Glucose-Capillary: 177 mg/dL — ABNORMAL HIGH (ref 70–99)

## 2011-01-27 LAB — CBC
MCHC: 34.4 g/dL (ref 30.0–36.0)
Platelets: 254 10*3/uL (ref 150–400)
RDW: 12.8 % (ref 11.5–15.5)

## 2011-01-27 LAB — BASIC METABOLIC PANEL
Calcium: 9.7 mg/dL (ref 8.4–10.5)
GFR calc Af Amer: >60 mL/min (ref >60)
GFR calc non Af Amer: >60 mL/min (ref >60)
Sodium: 140 mEq/L (ref 135–145)

## 2011-01-27 LAB — HEPARIN LEVEL (UNFRACTIONATED): Heparin Unfractionated: <0.1 IU/mL — ABNORMAL LOW (ref 0.30–0.70)

## 2011-01-27 NOTE — H&P (Signed)
NAMESEVRIN, SALLY                ACCOUNT NO.:  [id number]  MEDICAL RECORD NO.:  [id number]  LOCATION:  [facility]                         FACILITY:  [facility]  PHYSICIAN:  Italy Hilty, MD         DATE OF BIRTH:  Mar 19, 1961  DATE OF ADMISSION:  01/23/2011 DATE OF DISCHARGE:                             HISTORY & PHYSICAL   CHIEF COMPLAINT:  Chest pain.  HISTORY OF PRESENT ILLNESS:  Mr. Hanover is now 50 year old gentleman with a history of coronary artery disease status post 3-drug eluting stent placements in the past in [place].  He had 2 stents placed to the left circumflex and one of the RCA and was admitted on April 01, 2010, within 70% in-stent stenosis of the distal portion of the circumflex stent and additionally he had 70% in-stent stenosis of the mid right coronary artery.  He underwent a cutting balloon atherectomy of the right coronary artery at that time and a stent to the circumflex.  He returned with similar complaints of chest pain in February 2012, and a angiogram performed at that time showed 20% to 30% in-stent restenosis of the right coronary artery; however, there was a 90% diffuse in-stent restenosis in the area of overlap with this stent that was placed in October to the circumflex.  At that time, Dr. Tresa Endo performed a cutting balloon dilatation of the circumflex with reduction of stenosis to 0% and TIMI 3 flow.  On both occasions, he had improvement in his chest pain. Today, he returns to the hospital again with several weeks of increasing chest pain, which is somewhat associated with exertion and oftentimes occurs at rest.  Seems to be later in the day and he notes that if he does not take his isosorbide in the morning that his pain typically will occur later on in the day.  He also has been taking more sublingual nitroglycerin as needed.  Given this concern, he presented to the hospital for accelerating angina.  PAST MEDICAL HISTORY: 1. Unstable angina. 2.  Coronary artery disease as previously mentioned with multiple     episodes of in-stent restenosis. 3. Diabetes type 2. 4. Hypertension. 5. Hyperlipidemia. 6. History of alcohol use. 7. History of cocaine use remotely.  SOCIAL HISTORY:  The patient is a former smoker, now 1 year away removed from that.  He denies any cocaine and alcohol use; however, this was not tested today.  He does exercise daily according to him and a straining 45K at this time and is able to run without chest pain, interestingly if he takes his nitroglycerin.  FAMILY HISTORY:  Noncontributory.  REVIEW OF SYSTEMS:  A 10-point review of systems is negative except as per HPI.  PHYSICAL EXAMINATION:  VITAL SIGNS:  Pulse was 69, respiratory rate 17, blood pressure 118/83, and oxygen saturation 97% on room air. GENERAL:  Awake and oriented, in no apparent distress. HEENT:  Pupils equal, round, and reactive to light and accommodation. Extraocular muscles intact. NECK:  Supple.  No jugular venous distention.  No carotid bruits. HEART:  Regular rate and rhythm.  Normal S1 and S2.  No S3.  No murmurs, rubs, or gallops. ABDOMEN:  Soft and nontender.  Bowel sounds present. LUNGS:  Clear. Extremities:  Notable for 2+ pulses and no edema. NEUROLOGIC:  The patient is awake and oriented x3.  Strength is 5/5 bilaterally and equal in all extremities.  Cranial nerves II through XII were grossly intact and he follows commands.  LABORATORY DATA: 1. White blood cell count 6.2, hemoglobin/hematocrit 14/41, and     platelet count 260,000.  Potassium 4.2, BUN/creatinine 14/1.17. 2. Total CK 448 and CK-MB 6.0.  Troponin I is less than 0.3. 3. EKG shows a normal sinus rhythm with T-wave flattening in III and     aVF, which is actually unchanged from his previous EKG in February     2012.  IMPRESSION:  Unstable angina versus accelerating stable angina.  PLAN:  Mr. Nouri is complaining of increasing frequency and severity  of typical anginal symptoms and has a history of in-stent restenosis.  This is actually concerning for either accelerating stable angina or unstable angina.  There is a mild increase in his CK and CK-MB with a negative troponin.  Given these changes, would recommend starting nitroglycerin infusion to control his chest pain as well as heparin for acute coronary syndrome.  He also mentioned that he had recently stopped his lovastatin due to muscle pain and he had started taking fish oil in lieu of this. Unfortunately, the muscle pain did not improve with this and therefore I would like to place him back on a statin medication.  Of course, check a lipid profile as well as a drug screen.  We will plan most likely cardiac catheterization on Monday.  If he should have restenosis of one or more vessels or progression of coronary disease in the LAD or multivessel disease.  I would strongly recommend bypass surgery as a more permanent solution to his recurrent chest pain.     Italy Hilty, MD     CH/MEDQ  D:  01/23/2011  T:  01/23/2011  Job:  [id number]  Electronically Signed by K. HILTY M.D. on 01/27/2011 01:33:07 PM

## 2011-01-29 LAB — ALDOLASE: Aldolase: 7.5 U/L (ref <=8.1)

## 2011-01-31 ENCOUNTER — Emergency Department (HOSPITAL_COMMUNITY)
Admission: EM | Admit: 2011-01-31 | Discharge: 2011-01-31 | Disposition: A | Discharge status: Home / Self Care | Payer: Medicaid Other | Attending: Podiatry | Admitting: Podiatry

## 2011-01-31 DIAGNOSIS — L03019 Cellulitis of unspecified finger: Secondary | ICD-10-CM | POA: Insufficient documentation

## 2011-01-31 DIAGNOSIS — E7800 Pure hypercholesterolemia, unspecified: Secondary | ICD-10-CM | POA: Insufficient documentation

## 2011-01-31 DIAGNOSIS — M7989 Other specified soft tissue disorders: Secondary | ICD-10-CM | POA: Insufficient documentation

## 2011-01-31 DIAGNOSIS — I252 Old myocardial infarction: Secondary | ICD-10-CM | POA: Insufficient documentation

## 2011-01-31 DIAGNOSIS — E119 Type 2 diabetes mellitus without complications: Secondary | ICD-10-CM | POA: Insufficient documentation

## 2011-01-31 DIAGNOSIS — M79609 Pain in unspecified limb: Secondary | ICD-10-CM | POA: Insufficient documentation

## 2011-01-31 DIAGNOSIS — I10 Essential (primary) hypertension: Secondary | ICD-10-CM | POA: Insufficient documentation

## 2011-01-31 DIAGNOSIS — I2510 Atherosclerotic heart disease of native coronary artery without angina pectoris: Secondary | ICD-10-CM | POA: Insufficient documentation

## 2011-01-31 DIAGNOSIS — Z7982 Long term (current) use of aspirin: Secondary | ICD-10-CM | POA: Insufficient documentation

## 2011-01-31 DIAGNOSIS — Z79899 Other long term (current) drug therapy: Secondary | ICD-10-CM | POA: Insufficient documentation

## 2011-01-31 LAB — GLUCOSE, CAPILLARY: Glucose-Capillary: 165 mg/dL — ABNORMAL HIGH (ref 70–99)

## 2011-02-13 NOTE — Cardiovascular Report (Signed)
NAMEVIC, ESCO NO.:  [id number]  MEDICAL RECORD NO.:  [id number]  LOCATION:  6524                         FACILITY:  [facility]  PHYSICIAN:  Nanetta Batty, M.D.   DATE OF BIRTH:  25-Nov-1960  DATE OF PROCEDURE: DATE OF DISCHARGE:                           CARDIAC CATHETERIZATION   Mr. Sandlin is a 50 year old African American male with a history of prior PCI stenting in [place].  He had RCA and circumflex stenting by Dr. Julieanne Manson in October of last year, and was admitted in February for unstable angina, was cathed, and found to have NSTEMI stenosis within the AV groove circumflex stent.  He underwent cutting balloon atherectomy by Dr. Landry Dyke excellent angiographic result.  His other problems include hypertension, hyperlipidemia, and discontinued tobacco abuse as well as type 2 diabetes.  He ruled out for myocardial infarction and was admitted on January 23, 2011, with unstable angina and placed on IV heparin, nitro.  He presents now for angiography and potential intervention.  PROCEDURE DESCRIPTION:  The patient was brought to the second floor [facility] cardiac cath lab in the postabsorptive state.  He was premedicated with p.o. Valium, IV Versed, and fentanyl.  His right groin was prepped and shaved in the usual sterile fashion.  A 1% Xylocaine was used for local anesthesia.  A 5-French sheath was inserted into the right femoral artery using standard Seldinger technique.  A 5-French right-left Judkins diagnostic catheter as well as 5-French pigtail catheter were used for selective cholangiography and left ventriculography respectively.  Visipaque dye was used for the entirety of the case.  A retrograde aortic, left ventricular, and pull back pressures were recorded.  HEMODYNAMICS: 1. Aortic systolic pressure 111, diastolic pressure 77. 2. Left ventricular systolic pressure 116, end-diastolic pressure 19.  SELECTIVE CORONARY ANGIOGRAPHY: 1. Left main  normal. 2. LAD; the LAD had a 40-50% segmental stenosis in the midportion     between first and second diagonal branches. 3. Circumflex; the proximal half of the AV groove circumflex stented     segment had 90% diffuse in-stent restenosis. 4. Right coronary artery; dominant with a patent stent in the     midportion. 5. Left ventriculography; RAO left ventriculogram was performed using     25 mL of Visipaque dye at 12 mL per second.  The overall LVEF     estimated greater than 60% without focal wall motion abnormalities.  IMPRESSION:  Mr. Acuna has had aggressive in-stent restenosis, now 5 months post cutting balloon atherectomy of his mid AV groove circumflex stented segment.  These were stented with thrombus drug-eluting stents in October of 2011.  We will proceed with the stenting using Taxus iron drug-eluting stents since it has a different antiproliferative drug. With this restenosis, the patient will need either brachytherapy __________ or a one-vessel CABG.  PROCEDURE DESCRIPTION:  Existing 5-French sheath was exchanged over wire for a 6-French sheath.  He was on aspirin and Plavix daily and received additional 300 mg of p.o. Plavix, Angiomax bolus with an ACT of 386. Total of 135 mL of contrast was administered to the patient.  Using a 6-French XB 3.5 guide catheter along with 0.14 x 90 Asahi  soft wire, a 2.5 x 20 Emerge angioplasty was performed at 10 atmospheres within the stented segment.  Re-stenting was performed with 3.0 x 24 Taxus iron drug-eluting stent at 16 atmospheres and postdilatation with a 3.2 x 5 21 [place] Sprinter at 16 atmospheres (3.36 mm resulting reduction of 90%) in-stent restenosis to 0% residual excellent flow.  There was a short area of proximal circumflex that was unstented and was approximately 50% that was not intervened on.  IMPRESSION:  Successful percutaneous coronary intervention stenting of aggressive in-stent restenosis within the previously  stented atrioventricular  groove circumflex with Taxus iron drug-eluting stent (prior stents being Promus).  The guidewire and catheter removed.  The sheath was sewn securely in place.  The patient left lab in stable condition.  He will be treated with aspirin and Plavix, gently hydrated and discharged home in the morning.  We will get PTY12 platelet reactivity study as well.     Nanetta Batty, M.D.     Cordelia Pen  D:  01/26/2011  T:  01/26/2011  Job:  [id number]  cc:   Second floor [facility]  Electronically Signed by Nanetta Batty M.D. on 02/13/2011 03:34:33 PM

## 2011-02-13 NOTE — Discharge Summary (Signed)
[name], [name]                ACCOUNT NO.:  [id number]  MEDICAL RECORD NO.:  [id number]  LOCATION:  6524                         FACILITY:  [facility]  PHYSICIAN:  Nanetta Batty, M.D.   DATE OF BIRTH:  1960-10-10  DATE OF ADMISSION:  01/23/2011 DATE OF DISCHARGE:  01/27/2011                              DISCHARGE SUMMARY   DISCHARGE DIAGNOSES: 1. Coronary artery disease, status post left heart catheterization,     percutaneous coronary intervention with a Taxus drug-eluting stent     to the proximal portion of the circumflex stent for in-stent     restenosis. 2. Unstable angina and troponin negative x3.  CK-MB peak of 6.0. 3. Diabetes mellitus type 2. 4. Hypertension. 5. Hyperlipidemia.  HOSPITAL COURSE:  Mr. Dhanani is a 50 year old African American male with history of coronary artery disease, status post three drug-eluting stent placements in the past in [place].  He had two stents in the left circumflex and one in the RCA.  He was admitted back in October 2011 with 70% in-stent restenosis at the distal portion of the circumflex stent and 70% in-stent restenosis of the mid right coronary.  He underwent cutting balloon atherectomy of the right coronary artery at that time, and stent to the circumflex.  He also had subsequent cath in February 2012, which showed 20-30% in-stent restenosis in the right coronary artery.  There was a 90% diffuse in-stent restenosis area overlapped the stent that was placed in October.  Cutting balloon dilation of circ with reduction of stenosis to 0%.  He returns this admission with several weeks of increasing chest pain with exertion and often times at rest.  The patient was admitted to rule out acute coronary syndrome.  The patient's CK-MB was 6.0, troponins were negative x3, creatinine kinase peak of 448.  The patient was started on nitroglycerin infusion as well as IV heparin.  The patient continued to have some chest pain on January 25, 2011.   His IV nitro was increased to 30 mcg per minute.  EKG showed no acute changes.  Lipid panel showed triglycerides of 195, elevated LDL at 101.  The patient was scheduled for cath on January 26, 2011.  Left heart cath was performed, showed 90% in- stent restenosis in the proximal portion of the circumflex stent.  This was restented with a Taxus drug-eluting stent.  This resulted in 0% residual stenosis.  The patient is chest painfree.  His right inguinal region looks very good without erythema, ecchymosis, or tenderness.  He will be discharged home with a followup in 2-3 weeks.  DISCHARGE LABORATORY DATA:  WBCs 16.6, hemoglobin 14.3, hematocrit 41.6, platelets 254.  Also of note, the patient got dose of Solu-Medrol. Sodium 140, potassium 4.1, chloride 104, carbon dioxide 26, glucose 261, BUN 15, creatinine 0.87, calcium 9.7.  Total cholesterol 181, triglycerides 195, HDL 41, LDL 101, VLDL 39, total cholesterol to HDL ratio 4.4.  Again, CK-MB peak of 6.0, creatinine kinase total peak of 448.  MRSA negative.  STUDIES/PROCEDURES: 1. Chest x-ray.  No acute cardiopulmonary disease. 2. Left heart catheterization on January 26, 2011.  Impression:     Aggressive in-stent restenosis, 5 months post  cutting balloon     atherectomy in mid AV groove circumflex stent.  This was stented     with drug-eluting stent in October 2011.  This admission there was     successful percutaneous coronary intervention, stenting of the in-     stent restenosis in the atrioventricular groove circumflex with a     Taxus ION drug-eluting stent.  P2Y12 of 48, percent inhibition of     85.  The left main was normal.  The LAD had 40-50% segmental     stenosis in midportion between the first and second diagonal     branches.  Right coronary was dominant with patent stent in the     midportion.  The LVEF estimated was greater than 60% without focal     wall motion abnormality.  DISCHARGE MEDICATIONS: 1. Aspirin 325 mg 1 tablet  by mouth daily. 2. Carvedilol 12.5 mg 1 tab by mouth twice daily. 3. Imdur 30 mg 1 tablet by mouth daily. 4. Lisinopril 30 mg 1 tablet by mouth every evening. 5. Lovastatin 40 mg 1 tablet by mouth every evening. 6. Metformin 1000 mg 1 tablet by mouth twice daily.  The patient is     going to restart this medication on Thursday, January 29, 2011. 7. Nitroglycerin sublingual 0.4 mg 1 tablet under the tongue every 5     minutes up to 3 doses for chest pain. 8. Plavix 75 mg 1 tablet by mouth daily with meal.  DISPOSITION:  Mr. Poehlman will be discharged to home in stable condition. He will return to work on February 02, 2011.  He is to increase activity slowly.  He may shower and bathe.  No lifting or driving for 3 days.  He is recommended to eat heart-healthy, low-carb diet.  If the catheter site becomes red, pink, swollen, or discharges fluid or pus, he is to call our office.  He will follow with Dr. Rennis Golden in approximately 2-3 weeks.    ______________________________ Wilburt Finlay, PA   ______________________________ Nanetta Batty, M.D.    BH/MEDQ  D:  01/27/2011  T:  01/27/2011  Job:  [id number]  Electronically Signed by Wilburt Finlay PA on 02/06/2011 11:30:23 AM Electronically Signed by Nanetta Batty M.D. on 02/13/2011 03:34:31 PM

## 2011-02-21 ENCOUNTER — Emergency Department (HOSPITAL_COMMUNITY): Payer: Medicaid Other

## 2011-02-21 ENCOUNTER — Observation Stay (HOSPITAL_COMMUNITY)
Admission: EM | Admit: 2011-02-21 | Discharge: 2011-02-22 | Disposition: A | Discharge status: Home / Self Care | Payer: Medicaid Other | Attending: Internal Medicine | Admitting: Internal Medicine

## 2011-02-21 DIAGNOSIS — Z9861 Coronary angioplasty status: Secondary | ICD-10-CM | POA: Insufficient documentation

## 2011-02-21 DIAGNOSIS — E785 Hyperlipidemia, unspecified: Secondary | ICD-10-CM | POA: Insufficient documentation

## 2011-02-21 DIAGNOSIS — R0789 Other chest pain: Principal | ICD-10-CM | POA: Insufficient documentation

## 2011-02-21 DIAGNOSIS — E119 Type 2 diabetes mellitus without complications: Secondary | ICD-10-CM | POA: Insufficient documentation

## 2011-02-21 DIAGNOSIS — I10 Essential (primary) hypertension: Secondary | ICD-10-CM | POA: Insufficient documentation

## 2011-02-21 DIAGNOSIS — I2510 Atherosclerotic heart disease of native coronary artery without angina pectoris: Secondary | ICD-10-CM | POA: Insufficient documentation

## 2011-02-21 LAB — DIFFERENTIAL
Basophils Absolute: 0 10*3/uL (ref 0.0–0.1)
Eosinophils Relative: 3 % (ref 0–5)
Lymphocytes Relative: 49 % — ABNORMAL HIGH (ref 12–46)
Lymphs Abs: 2.7 10*3/uL (ref 0.7–4.0)
Neutro Abs: 2.2 10*3/uL (ref 1.7–7.7)
Neutrophils Relative %: 39 % — ABNORMAL LOW (ref 43–77)

## 2011-02-21 LAB — CBC
HCT: 37.7 % — ABNORMAL LOW (ref 39.0–52.0)
RBC: 4.6 MIL/uL (ref 4.22–5.81)
RDW: 12.7 % (ref 11.5–15.5)
WBC: 5.6 10*3/uL (ref 4.0–10.5)

## 2011-02-22 LAB — HEPATIC FUNCTION PANEL
Alkaline Phosphatase: 56 U/L (ref 39–117)
Indirect Bilirubin: 0.2 mg/dL — ABNORMAL LOW (ref 0.3–0.9)
Total Bilirubin: 0.3 mg/dL (ref 0.3–1.2)
Total Protein: 6.7 g/dL (ref 6.0–8.3)

## 2011-02-22 LAB — URINALYSIS, ROUTINE W REFLEX MICROSCOPIC
Bilirubin Urine: NEGATIVE
Glucose, UA: NEGATIVE mg/dL
Ketones, ur: NEGATIVE mg/dL
Protein, ur: NEGATIVE mg/dL
pH: 6.5 (ref 5.0–8.0)

## 2011-02-22 LAB — RAPID URINE DRUG SCREEN, HOSP PERFORMED
Amphetamines: NOT DETECTED
Benzodiazepines: NOT DETECTED
Cocaine: NOT DETECTED
Opiates: NOT DETECTED
Tetrahydrocannabinol: NOT DETECTED

## 2011-02-22 LAB — CBC
MCH: 27.6 pg (ref 26.0–34.0)
MCHC: 33.7 g/dL (ref 30.0–36.0)
MCV: 81.9 fL (ref 78.0–100.0)
Platelets: 223 10*3/uL (ref 150–400)
RDW: 12.7 % (ref 11.5–15.5)

## 2011-02-22 LAB — SEDIMENTATION RATE: Sed Rate: 2 mm/hr (ref 0–16)

## 2011-02-22 LAB — GLUCOSE, CAPILLARY

## 2011-02-22 LAB — BASIC METABOLIC PANEL
CO2: 29 mEq/L (ref 19–32)
Calcium: 9.6 mg/dL (ref 8.4–10.5)
GFR calc non Af Amer: >60 mL/min (ref >60)
Potassium: 4.1 mEq/L (ref 3.5–5.1)
Sodium: 138 mEq/L (ref 135–145)

## 2011-02-22 LAB — LIPID PANEL
HDL: 38 mg/dL — ABNORMAL LOW (ref >39)
Total CHOL/HDL Ratio: 2.8 RATIO

## 2011-02-22 LAB — COMPREHENSIVE METABOLIC PANEL
Albumin: 3.7 g/dL (ref 3.5–5.2)
Alkaline Phosphatase: 52 U/L (ref 39–117)
BUN: 12 mg/dL (ref 6–23)
CO2: 27 mEq/L (ref 19–32)
Chloride: 107 mEq/L (ref 96–112)
Glucose, Bld: 132 mg/dL — ABNORMAL HIGH (ref 70–99)
Potassium: 3.9 mEq/L (ref 3.5–5.1)
Total Bilirubin: 0.3 mg/dL (ref 0.3–1.2)

## 2011-02-22 LAB — LACTATE DEHYDROGENASE: LDH: 202 U/L (ref 94–250)

## 2011-02-22 LAB — CK: Total CK: 529 U/L — ABNORMAL HIGH (ref 7–232)

## 2011-02-22 LAB — CARDIAC PANEL(CRET KIN+CKTOT+MB+TROPI)
CK, MB: 5.8 ng/mL — ABNORMAL HIGH (ref 0.3–4.0)
Relative Index: 1.2 (ref 0.0–2.5)
Total CK: 484 U/L — ABNORMAL HIGH (ref 7–232)
Troponin I: <0.3 ng/mL (ref <0.30)
Troponin I: <0.3 ng/mL (ref <0.30)

## 2011-02-22 LAB — CK TOTAL AND CKMB (NOT AT ARMC)
CK, MB: 8.6 ng/mL (ref 0.3–4.0)
Relative Index: 1.1 (ref 0.0–2.5)
Total CK: 755 U/L — ABNORMAL HIGH (ref 7–232)

## 2011-02-22 LAB — D-DIMER, QUANTITATIVE: D-Dimer, Quant: <0.22 ug/mL-FEU (ref 0.00–0.48)

## 2011-02-22 LAB — TSH: TSH: 1.058 u[IU]/mL (ref 0.350–4.500)

## 2011-02-22 LAB — PROTIME-INR: INR: 0.97 (ref 0.00–1.49)

## 2011-02-22 LAB — HEMOGLOBIN A1C: Hgb A1c MFr Bld: 6.6 % — ABNORMAL HIGH (ref <5.7)

## 2011-02-24 LAB — ALDOLASE: Aldolase: 7.1 U/L (ref <=8.1)

## 2011-02-26 ENCOUNTER — Ambulatory Visit (HOSPITAL_COMMUNITY)
Admission: RE | Admit: 2011-02-26 | Discharge: 2011-02-26 | Disposition: A | Discharge status: Home / Self Care | Payer: Medicaid Other | Source: Ambulatory Visit | Attending: Internal Medicine | Admitting: Internal Medicine

## 2011-02-26 ENCOUNTER — Other Ambulatory Visit (HOSPITAL_COMMUNITY): Payer: Self-pay | Admitting: Internal Medicine

## 2011-02-26 DIAGNOSIS — M25559 Pain in unspecified hip: Secondary | ICD-10-CM | POA: Insufficient documentation

## 2011-02-26 DIAGNOSIS — R52 Pain, unspecified: Secondary | ICD-10-CM

## 2011-02-27 ENCOUNTER — Ambulatory Visit (HOSPITAL_COMMUNITY)
Admission: RE | Admit: 2011-02-27 | Discharge: 2011-02-27 | Disposition: A | Discharge status: Home / Self Care | Payer: Medicaid Other | Source: Ambulatory Visit | Attending: Internal Medicine | Admitting: Internal Medicine

## 2011-02-27 ENCOUNTER — Ambulatory Visit: Payer: Medicaid Other | Admitting: Physical Therapy

## 2011-02-27 DIAGNOSIS — M545 Low back pain, unspecified: Secondary | ICD-10-CM | POA: Insufficient documentation

## 2011-02-27 DIAGNOSIS — R52 Pain, unspecified: Secondary | ICD-10-CM

## 2011-03-05 NOTE — Discharge Summary (Signed)
NAMEJACORI, [name]                ACCOUNT NO.:  [id number]  MEDICAL RECORD NO.:  [id number]  LOCATION:  2007                         FACILITY:  [facility]  PHYSICIAN:  Dr. Rennis Golden              DATE OF BIRTH:  [birth year]  DATE OF ADMISSION:  02/21/2011 DATE OF DISCHARGE:  02/22/2011                              DISCHARGE SUMMARY   DISCHARGE DIAGNOSES: 1. Chest pain, skeletal myopathy.     a.     Possibly related to Cozaar or Ranexa or lovastatin. 2. Coronary artery disease with stents to the right coronary artery     and circumflex.  Now negative myocardial infarction. 3. 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 4. Diabetes mellitus type 2. 5. Dyslipidemia. 6. Hypertension.  DISCHARGE CONDITION:  Improved.  PROCEDURES:  None.  DISCHARGE MEDICATIONS:  See medication reconciliation sheet though we discontinued his Cozaar, Ranexa, and lovastatin.  DISCHARGE INSTRUCTIONS: 1. Activity as tolerated beginning slowly.  Low-sodium heart-healthy     diabetic diet. 2. Follow up with Dr. Rennis Golden.  The office will call with date and time.     The appointment will be in 2 weeks though. 3. Have blood work done on Thursday, a CMET and CK-MB on first floor     of [facility]'s office.  HOSPITAL COURSE:  A 50 year old African American male with extensive coronary artery disease, multiple stents, in-stent restenosis most recent intervention performed January 26, 2011, underwent PCI and stent for in-stent restenosis of the AV groove circumflex with Taxus DES stent. He has done well since but on February 21, 2011, he developed sharp chest pain located to his right sternum, pain occurred at rest and was persistent, no relief with sublingual nitro at home but pain eased with IV morphine.  EKG revealed sinus rhythm without ischemic changes.  The CK was elevated but negative troponin, negative relative index.  He denied shortness of breath, no nausea, vomiting or diaphoresis.  He was admitted on  heparin and nitroglycerin and then seen by Dr. Rennis Golden after discussion and cardiac enzymes, i.e., troponin and relative index negative, it was felt to be a skeletal myopathy.  The patient will be discharged with stopping medications as described.  LABORATORY DATA:  Hemoglobin 12.5, hematocrit 37.1, WBC is 3.7, platelets 223.  Sed rate was 2, pro time 13.1, INR 0.97, PTT 34, D-dimer less than 0.22.  Sodium 140, potassium 3.9, chloride 107, CO2 27, glucose 132, BUN 12, creatinine 0.79.  Total bili 0.3, direct bili 0.1, indirect bili 0.2, alkaline phos 56, SGOT 22, SGPT 24, total protein 6.4, albumin 4.1, calcium 9.1 and LDH 202, magnesium 2.0, hemoglobin A1c was 6.6.  Cardiac enzymes CK 755-44, MB 8.6-5.8 and relative index 1.1-1.2, troponin I was negative at less than 0.30.  Total cholesterol 105, triglycerides 116, HDL 38, LDL 44.  TSH 2.541.  UA was clear.  RADIOLOGY:  No active cardiopulmonary process.  The patient will be discharged home and follow up as instructed.     [name], N.P.   ______________________________Dr. Rennis Golden    LRI/MEDQ  D:  02/22/2011  T:  02/22/2011  Job:  [id number]  cc:  Kenneth Livings, [name]  Electronically Signed by Nada Boozer N.P. on 02/25/2011 11:13:19 AM Electronically Signed by Kenneth Bouchard. Marvyn Torrez M.D. on 03/05/2011 08:11:46 AM

## 2011-03-12 ENCOUNTER — Ambulatory Visit: Payer: Medicaid Other | Admitting: Physical Therapy

## 2011-03-26 ENCOUNTER — Ambulatory Visit: Payer: Medicaid Other | Attending: Internal Medicine | Admitting: Rehabilitative and Restorative Service Providers"

## 2011-03-26 DIAGNOSIS — IMO0001 Reserved for inherently not codable concepts without codable children: Secondary | ICD-10-CM | POA: Insufficient documentation

## 2011-03-26 DIAGNOSIS — M25559 Pain in unspecified hip: Secondary | ICD-10-CM | POA: Insufficient documentation

## 2011-04-02 ENCOUNTER — Ambulatory Visit: Payer: Medicaid Other | Attending: Internal Medicine | Admitting: Rehabilitative and Restorative Service Providers"

## 2011-04-02 DIAGNOSIS — M25559 Pain in unspecified hip: Secondary | ICD-10-CM | POA: Insufficient documentation

## 2011-04-02 DIAGNOSIS — IMO0001 Reserved for inherently not codable concepts without codable children: Secondary | ICD-10-CM | POA: Insufficient documentation

## 2011-04-09 ENCOUNTER — Encounter: Payer: Medicaid Other | Admitting: Rehabilitation

## 2011-04-16 ENCOUNTER — Encounter: Payer: Medicaid Other | Admitting: Rehabilitation

## 2011-04-25 ENCOUNTER — Emergency Department (HOSPITAL_COMMUNITY)
Admission: EM | Admit: 2011-04-25 | Discharge: 2011-04-25 | Disposition: A | Discharge status: Home / Self Care | Payer: Medicaid Other | Attending: Emergency Medicine | Admitting: Emergency Medicine

## 2011-04-25 DIAGNOSIS — E7800 Pure hypercholesterolemia, unspecified: Secondary | ICD-10-CM | POA: Insufficient documentation

## 2011-04-25 DIAGNOSIS — Z7902 Long term (current) use of antithrombotics/antiplatelets: Secondary | ICD-10-CM | POA: Insufficient documentation

## 2011-04-25 DIAGNOSIS — E119 Type 2 diabetes mellitus without complications: Secondary | ICD-10-CM | POA: Insufficient documentation

## 2011-04-25 DIAGNOSIS — I2510 Atherosclerotic heart disease of native coronary artery without angina pectoris: Secondary | ICD-10-CM | POA: Insufficient documentation

## 2011-04-25 DIAGNOSIS — Z7982 Long term (current) use of aspirin: Secondary | ICD-10-CM | POA: Insufficient documentation

## 2011-04-25 DIAGNOSIS — I252 Old myocardial infarction: Secondary | ICD-10-CM | POA: Insufficient documentation

## 2011-04-25 DIAGNOSIS — K5641 Fecal impaction: Secondary | ICD-10-CM | POA: Insufficient documentation

## 2011-04-25 DIAGNOSIS — I10 Essential (primary) hypertension: Secondary | ICD-10-CM | POA: Insufficient documentation

## 2011-04-25 LAB — POCT I-STAT, CHEM 8
BUN: 17 mg/dL (ref 6–23)
Chloride: 105 mEq/L (ref 96–112)
Creatinine, Ser: 0.9 mg/dL (ref 0.50–1.35)
Glucose, Bld: 103 mg/dL — ABNORMAL HIGH (ref 70–99)
Hemoglobin: 14.6 g/dL (ref 13.0–17.0)
Potassium: 4 mEq/L (ref 3.5–5.1)
Sodium: 141 mEq/L (ref 135–145)

## 2011-04-25 LAB — DIFFERENTIAL
Basophils Absolute: 0 10*3/uL (ref 0.0–0.1)
Basophils Relative: 0 % (ref 0–1)
Lymphocytes Relative: 19 % (ref 12–46)
Monocytes Absolute: 0.7 10*3/uL (ref 0.1–1.0)
Neutro Abs: 6.1 10*3/uL (ref 1.7–7.7)
Neutrophils Relative %: 71 % (ref 43–77)

## 2011-04-25 LAB — CBC
HCT: 40.8 % (ref 39.0–52.0)
Hemoglobin: 13.7 g/dL (ref 13.0–17.0)
RBC: 4.89 MIL/uL (ref 4.22–5.81)
WBC: 8.5 10*3/uL (ref 4.0–10.5)

## 2011-04-25 LAB — URINALYSIS, ROUTINE W REFLEX MICROSCOPIC
Bilirubin Urine: NEGATIVE
Hgb urine dipstick: NEGATIVE
Nitrite: NEGATIVE
Specific Gravity, Urine: 1.029 (ref 1.005–1.030)
Urobilinogen, UA: 0.2 mg/dL (ref 0.0–1.0)
pH: 5.5 (ref 5.0–8.0)

## 2011-04-26 LAB — URINE CULTURE
Culture  Setup Time: 201210272007
Culture: NO GROWTH

## 2011-05-09 ENCOUNTER — Emergency Department (HOSPITAL_COMMUNITY): Payer: Medicaid Other

## 2011-05-09 ENCOUNTER — Inpatient Hospital Stay (HOSPITAL_COMMUNITY)
Admission: EM | Admit: 2011-05-09 | Discharge: 2011-05-12 | DRG: 287 | Disposition: A | Discharge status: Home / Self Care | Payer: Medicaid Other | Attending: Cardiovascular Disease | Admitting: Cardiovascular Disease

## 2011-05-09 ENCOUNTER — Other Ambulatory Visit: Payer: Self-pay

## 2011-05-09 ENCOUNTER — Encounter: Payer: Self-pay | Admitting: *Deleted

## 2011-05-09 DIAGNOSIS — Z91041 Radiographic dye allergy status: Secondary | ICD-10-CM | POA: Diagnosis present

## 2011-05-09 DIAGNOSIS — Z9861 Coronary angioplasty status: Secondary | ICD-10-CM

## 2011-05-09 DIAGNOSIS — E785 Hyperlipidemia, unspecified: Secondary | ICD-10-CM | POA: Diagnosis present

## 2011-05-09 DIAGNOSIS — E119 Type 2 diabetes mellitus without complications: Secondary | ICD-10-CM | POA: Diagnosis present

## 2011-05-09 DIAGNOSIS — I200 Unstable angina: Secondary | ICD-10-CM | POA: Diagnosis present

## 2011-05-09 DIAGNOSIS — I10 Essential (primary) hypertension: Secondary | ICD-10-CM | POA: Diagnosis present

## 2011-05-09 DIAGNOSIS — I2510 Atherosclerotic heart disease of native coronary artery without angina pectoris: Principal | ICD-10-CM | POA: Diagnosis present

## 2011-05-09 HISTORY — DX: Essential (primary) hypertension: I10

## 2011-05-09 HISTORY — DX: Atherosclerotic heart disease of native coronary artery without angina pectoris: I25.10

## 2011-05-09 LAB — CARDIAC PANEL(CRET KIN+CKTOT+MB+TROPI)
Relative Index: 1.4 (ref 0.0–2.5)
Total CK: 344 U/L — ABNORMAL HIGH (ref 7–232)
Total CK: 321 U/L — ABNORMAL HIGH (ref 7–232)
Troponin I: <0.3 ng/mL (ref <0.30)

## 2011-05-09 LAB — CBC
HCT: 41.1 % (ref 39.0–52.0)
Hemoglobin: 13.8 g/dL (ref 13.0–17.0)
Hemoglobin: 13.8 g/dL (ref 13.0–17.0)
MCH: 28 pg (ref 26.0–34.0)
MCHC: 33.6 g/dL (ref 30.0–36.0)
MCV: 83.3 fL (ref 78.0–100.0)
Platelets: 236 10*3/uL (ref 150–400)
RBC: 4.93 MIL/uL (ref 4.22–5.81)
RBC: 4.92 MIL/uL (ref 4.22–5.81)
WBC: 5.6 10*3/uL (ref 4.0–10.5)

## 2011-05-09 LAB — GLUCOSE, CAPILLARY: Glucose-Capillary: 151 mg/dL — ABNORMAL HIGH (ref 70–99)

## 2011-05-09 LAB — HEPATIC FUNCTION PANEL
ALT: 28 U/L (ref 0–53)
AST: 19 U/L (ref 0–37)
Alkaline Phosphatase: 56 U/L (ref 39–117)
Bilirubin, Direct: <0.1 mg/dL (ref 0.0–0.3)
Total Protein: 6.6 g/dL (ref 6.0–8.3)

## 2011-05-09 LAB — BASIC METABOLIC PANEL
BUN: 12 mg/dL (ref 6–23)
CO2: 25 mEq/L (ref 19–32)
Calcium: 9.6 mg/dL (ref 8.4–10.5)
Glucose, Bld: 121 mg/dL — ABNORMAL HIGH (ref 70–99)
Sodium: 138 mEq/L (ref 135–145)

## 2011-05-09 LAB — POCT I-STAT TROPONIN I

## 2011-05-09 LAB — CREATININE, SERUM
Creatinine, Ser: 0.8 mg/dL (ref 0.50–1.35)
GFR calc Af Amer: >90 mL/min (ref >90)

## 2011-05-09 LAB — PROTIME-INR: INR: 0.91 (ref 0.00–1.49)

## 2011-05-09 MED ORDER — TRAMADOL HCL 50 MG PO TABS
50.0000 mg | ORAL_TABLET | Freq: Four times a day (QID) | ORAL | Status: DC | PRN
  Filled 2011-05-09: qty 1

## 2011-05-09 MED ORDER — ASPIRIN EC 325 MG PO TBEC
325.0000 mg | DELAYED_RELEASE_TABLET | Freq: Every day | ORAL | Status: DC
  Administered 2011-05-10 – 2011-05-12 (×3): 325 mg via ORAL
  Filled 2011-05-09 (×3): qty 1

## 2011-05-09 MED ORDER — ENOXAPARIN SODIUM 150 MG/ML ~~LOC~~ SOLN: 1.0000 mg/kg | Freq: Two times a day (BID) | SUBCUTANEOUS | Status: DC

## 2011-05-09 MED ORDER — CARVEDILOL 12.5 MG PO TABS
12.5000 mg | ORAL_TABLET | Freq: Two times a day (BID) | ORAL | Status: DC
  Administered 2011-05-09 – 2011-05-11 (×5): 12.5 mg via ORAL
  Filled 2011-05-09 (×7): qty 1

## 2011-05-09 MED ORDER — CLOPIDOGREL BISULFATE 75 MG PO TABS
75.0000 mg | ORAL_TABLET | Freq: Every day | ORAL | Status: DC
  Administered 2011-05-09 – 2011-05-12 (×4): 75 mg via ORAL
  Filled 2011-05-09 (×4): qty 1

## 2011-05-09 MED ORDER — ALPRAZOLAM 0.25 MG PO TABS: 0.2500 mg | ORAL_TABLET | Freq: Two times a day (BID) | ORAL | Status: DC | PRN

## 2011-05-09 MED ORDER — INSULIN ASPART 100 UNIT/ML ~~LOC~~ SOLN
0.0000 [IU] | Freq: Every day | SUBCUTANEOUS | Status: DC
  Administered 2011-05-11: 4 [IU] via SUBCUTANEOUS

## 2011-05-09 MED ORDER — ASPIRIN 81 MG PO CHEW
324.0000 mg | CHEWABLE_TABLET | ORAL | Status: AC
  Administered 2011-05-09: 324 mg via ORAL

## 2011-05-09 MED ORDER — ENOXAPARIN SODIUM 100 MG/ML ~~LOC~~ SOLN
90.0000 mg | Freq: Two times a day (BID) | SUBCUTANEOUS | Status: DC
  Administered 2011-05-09: 23:00:00 via SUBCUTANEOUS
  Administered 2011-05-09 – 2011-05-10 (×2): 90 mg via SUBCUTANEOUS
  Administered 2011-05-11: 100 mg via SUBCUTANEOUS
  Filled 2011-05-09 (×6): qty 1

## 2011-05-09 MED ORDER — INSULIN ASPART 100 UNIT/ML ~~LOC~~ SOLN
0.0000 [IU] | Freq: Three times a day (TID) | SUBCUTANEOUS | Status: DC
  Administered 2011-05-09 (×2): 1 [IU] via SUBCUTANEOUS
  Administered 2011-05-10 (×2): 2 [IU] via SUBCUTANEOUS
  Administered 2011-05-11: 5 [IU] via SUBCUTANEOUS
  Administered 2011-05-11: 1 [IU] via SUBCUTANEOUS
  Administered 2011-05-12 (×2): 7 [IU] via SUBCUTANEOUS
  Filled 2011-05-09 (×2): qty 3

## 2011-05-09 MED ORDER — ONDANSETRON HCL 4 MG/2ML IJ SOLN: 4.0000 mg | Freq: Four times a day (QID) | INTRAMUSCULAR | Status: DC | PRN

## 2011-05-09 MED ORDER — ASPIRIN 325 MG PO TABS
325.0000 mg | ORAL_TABLET | ORAL | Status: AC
  Administered 2011-05-09: 325 mg via ORAL
  Filled 2011-05-09: qty 1

## 2011-05-09 MED ORDER — ZOLPIDEM TARTRATE 5 MG PO TABS: 10.0000 mg | ORAL_TABLET | Freq: Every evening | ORAL | Status: DC | PRN

## 2011-05-09 MED ORDER — NITROGLYCERIN 0.4 MG SL SUBL: 0.4000 mg | SUBLINGUAL_TABLET | SUBLINGUAL | Status: DC | PRN

## 2011-05-09 MED ORDER — ROSUVASTATIN CALCIUM 5 MG PO TABS
5.0000 mg | ORAL_TABLET | Freq: Every day | ORAL | Status: DC
  Administered 2011-05-09: 14:00:00 via ORAL
  Administered 2011-05-10 – 2011-05-12 (×3): 5 mg via ORAL
  Filled 2011-05-09 (×5): qty 1

## 2011-05-09 MED ORDER — ISOSORBIDE MONONITRATE ER 60 MG PO TB24
60.0000 mg | ORAL_TABLET | Freq: Every day | ORAL | Status: DC
  Administered 2011-05-09 – 2011-05-12 (×4): 60 mg via ORAL
  Filled 2011-05-09 (×5): qty 1

## 2011-05-09 MED ORDER — LISINOPRIL 5 MG PO TABS
5.0000 mg | ORAL_TABLET | Freq: Every day | ORAL | Status: DC
  Administered 2011-05-09 – 2011-05-12 (×4): 5 mg via ORAL
  Filled 2011-05-09 (×5): qty 1

## 2011-05-09 MED ORDER — ACETAMINOPHEN 325 MG PO TABS: 650.0000 mg | ORAL_TABLET | ORAL | Status: DC | PRN

## 2011-05-09 MED ORDER — SODIUM CHLORIDE 0.9 % IV SOLN
INTRAVENOUS | Status: AC
  Administered 2011-05-09: 09:00:00 via INTRAVENOUS

## 2011-05-09 NOTE — ED Notes (Signed)
Patient returned from Radiology, no chest pain, vitals stable, will continue to monitor.

## 2011-05-09 NOTE — Progress Notes (Signed)
ANTICOAGULATION CONSULT NOTE - Initial Consult  Pharmacy Consult for Lovenox Indication: chest pain/ACS  Allergies  Allergen Reactions  . Bee Venom Anaphylaxis and Hives  . Shellfish Allergy Anaphylaxis and Hives    Patient Measurements:   Weight from August 2012:  90.1 kg  Vital Signs: Temp: 97.8 F (36.6 C) (11/10 0137) Temp src: Oral (11/10 0137) BP: 138/84 mmHg (11/10 0900) Pulse Rate: 72  (11/10 0900)  Labs:  Basename 05/09/11 0825 05/09/11 0724 05/09/11 0204  HGB 13.8 -- 13.8  HCT 41.0 -- 41.1  PLT 236 -- 255  APTT -- -- --  LABPROT -- 12.5 --  INR -- 0.91 --  HEPARINUNFRC -- -- --  CREATININE 0.80 -- 0.88  CKTOTAL -- 344* --  CKMB -- 4.8* --  TROPONINI -- <0.30 --   The CrCl is unknown because both a height and weight (above a minimum accepted value) are required for this calculation.  Medical History: Past Medical History  Diagnosis Date  . Coronary artery disease   . Hypertension   . Diabetes mellitus   . Hypercholesterolemia   . Angina     Medications:  Home meds pending  Assessment: 50 yo M with a hx CAD admitted with increasing CP over the last 4 days. Pharmacy consulted to manage Lovenox. Baseline labs wnl. Plan is for nuclear stress test unless CE's are positive.  Goal of Therapy:  Heparin level 0.6 - 1.2   Plan:  Lovenox 90 mg sq q12h - will adjust dose for weight this admit if needed. CBC q72h while on Lovenox.  [name] [name] [name] [name] [name] 05/09/2011,9:30 AM

## 2011-05-09 NOTE — ED Notes (Signed)
C/o mid L sided CP, denies other sx, worse with exertion, h/o stents, placed ~ 2010, pt of Dr. [place] Hilty [facility],  Admitted ~ 2.5 months ago.

## 2011-05-09 NOTE — ED Provider Notes (Signed)
History     CSN: [id number] Arrival date & time: 05/09/2011  1:27 AM   First MD Initiated Contact with Patient 05/09/11 0240      Chief Complaint  Patient presents with  . Chest Pain    (Consider location/radiation/quality/duration/timing/severity/associated sxs/prior treatment) HPI The patient is a 50 year old male who presents today for evaluation of chest pain. Patient has left-sided chest pain consistent with his typical angina. Patient has history of prior catheterizations with stenting last done in 2010 as well as angioplasty 2-1/2 months ago. Patient has not received aspirin or nitroglycerin prior to presentation. His chest pain has not gone completely away since yesterday. There are 10-15 minute periods where this is more severe and up to a 10 out of 10 as far as his pain. Currently patient has 6/10 pain. He has no associated shortness of breath, diaphoresis, lightheadedness, or nausea. There's no radiation of his pain. 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 He's been taking his Plavix and does not smoke. There are no other associated or modifying factors. Past Medical History  Diagnosis Date  . Coronary artery disease   . Hypertension   . Diabetes mellitus   . Hypercholesterolemia   . Angina     Past Surgical History  Procedure Date  . Cardiac catheterization     with stent placement  . Coronary angioplasty   . Coronary angioplasty with stent placement 2010 and 2011    x 2 cardaic stents    Family History  Problem Relation Age of Onset  . Coronary artery disease Other   . Leukemia Mother   . Prostate cancer Father     History  Substance Use Topics  . Smoking status: Former Smoker    Quit date: 04/07/2009  . Smokeless tobacco: Not on file  . Alcohol Use: No      Review of Systems  Constitutional: Negative.   HENT: Negative.   Eyes: Negative.   Respiratory: Negative.   Cardiovascular: Positive for chest pain.    Gastrointestinal: Negative.   Genitourinary: Negative.   Musculoskeletal: Negative.   Skin: Negative.   Neurological: Negative.   Hematological: Negative.   Psychiatric/Behavioral: Negative.   All other systems reviewed and are negative.    Allergies  Bee venom and Shellfish allergy  Home Medications  No current outpatient prescriptions on file.  BP 138/84  Pulse 72  Temp(Src) 97.8 F (36.6 C) (Oral)  Resp 14  SpO2 100%  Physical Exam  Nursing note and vitals reviewed. Constitutional: He is oriented to person, place, and time. He appears well-developed and well-nourished. No distress.  HENT:  Head: Normocephalic and atraumatic.  Eyes: Conjunctivae and EOM are normal. Pupils are equal, round, and reactive to light.  Neck: Normal range of motion.  Cardiovascular: Normal rate, regular rhythm, normal heart sounds and intact distal pulses.  Exam reveals no gallop and no friction rub.   No murmur heard. Pulmonary/Chest: Effort normal and breath sounds normal.  Abdominal: Soft. Bowel sounds are normal. He exhibits no distension. There is no tenderness. There is no rebound and no guarding.  Musculoskeletal: Normal range of motion.  Neurological: He is alert and oriented to person, place, and time. No cranial nerve deficit. He exhibits normal muscle tone. Coordination normal.  Skin: Skin is warm and dry. No rash noted. No erythema.  Psychiatric: He has a normal mood and affect.    ED Course  Procedures (including critical care time)  Date: 05/09/2011  Rate: 73  Rhythm: normal sinus rhythm  QRS Axis: normal  Intervals: normal  ST/T Wave abnormalities: nonspecific ST/T changes  Conduction Disutrbances:none  Narrative Interpretation: No acute ischemic changes  Old EKG Reviewed: unchanged  Labs Reviewed  BASIC METABOLIC PANEL - Abnormal; Notable for the following:    Glucose, Bld 121 (*)    All other components within normal limits  CARDIAC PANEL(CRET  KIN+CKTOT+MB+TROPI) - Abnormal; Notable for the following:    Total CK 344 (*)    CK, MB 4.8 (*)    All other components within normal limits  CBC  POCT I-STAT TROPONIN I  HEPATIC FUNCTION PANEL  MAGNESIUM  PROTIME-INR  CBC  CREATININE, SERUM  CARDIAC PANEL(CRET KIN+CKTOT+MB+TROPI)  TSH  HEMOGLOBIN A1C  POCT CBG MONITORING   Dg Chest 2 View  05/09/2011  *RADIOLOGY REPORT*  Clinical Data: Worsening intermittent chest pain.  CHEST - 2 VIEW  Comparison: Chest radiograph performed 02/22/2011  Findings: The lungs are mildly hypoexpanded but appear clear. There is no evidence of focal opacification, pleural effusion or pneumothorax.  The heart is borderline normal in size; the mediastinal contour is within normal limits. A vascular stent is noted along the left mediastinum.  There is mild stable prominence of the hila.  No acute osseous abnormalities are seen.  IMPRESSION: Mildly hypoexpanded but clear lungs.  Original Report Authenticated By: Tonia Ghent, M.D.     1. Chest pain       MDM  Patient was examined by myself and had history concerning for return of his ACS. He was hemodynamically stable but did have chest pain. Following nitroglycerin pain resolved. Patient did receive aspirin. Chest x-ray was unremarkable as were CBC and BMP. Troponin was negative. EKG was unchanged. Consult was placed to cardiology. [facility] will see the patient here in the ED and he will be admitted to their service.        Cyndra Numbers, MD 05/09/11 1030

## 2011-05-09 NOTE — ED Notes (Signed)
Patient denies pain and is resting comfortably.  

## 2011-05-09 NOTE — H&P (Signed)
[name] is an 50 y.o. male.   Chief Complaint: chest pain. HPI: 50 year old African American male, single with 2 children, presents to the emergency room with increasing chest pain over the last 4 days.  He does have a history of coronary artery disease with 3 drug-eluting stents placed in the past in [place]. 2 stents were placed to the left circumflex and one to the RCA. In October of 2011 when he was admitted with chest pain and was found to have 70% in-stent restenosis of the distal portion of the circumflex stent and a 70% in-stent restenosis of the mid RCA. At that time he underwent cutting balloon atherectomy of the RCA and stent to the circumflex. He had recurrent chest pain in February 2012 and cardiac catheter at that time revealed 20-30% in-stent restenosis of the RCA however there was a 90% diffuse in-stent restenosis in the area of overlap within the stent that was placed in October to the circumflex. He then underwent cutting balloon dilatation of the circumflex reducing the stenosis to 0%. He was then readmitted in July of 2012 with recurrent chest pain and underwent cardiac catheterization. He was found to have aggressive in-stent restenosis 5 months after cutting balloon atherectomy of his mid AV groove the circumflex area and he then underwent stenting using a Taxus ion drug-eluting stent it was felt at that time if he had restenosis he would need brachi- therapy.  He now presents with chest pain that began on Tuesday of this week would occur a off-and-on until yesterday and it was more of a constant chest pain. He denies shortness of breath nausea vomiting or diaphoresis. Stated the pain was 4 on a 1-10 scale. It occurs in his left anterior chest and is more of a pressure pain.It did increase with exertion, walking to the bus to come to the hospital. He has increased his use of sublingual nitroglycerin over this last week has taken many with relief of the discomfort.  Currently in  the emergency room he is pain free. EKG with nonspecific ST abnormalities there is no significant change from EKG of August 2012.  Initial troponin I was negative. He did take aspirin prior to coming to the emergency room.  Please note patient was also admitted in August of 2012 with chest pain which at that time wasn't felt to be musculoskeletal secondary to myalgias and his statin was DC'd which improved his muscular pain. At that time his lisinopril was also stopped him but they felt the myalgia were related to the statins. Past Medical History  Diagnosis Date  . Coronary artery disease   . Hypertension   . Diabetes mellitus   . Hypercholesterolemia   . Angina     Past Surgical History  Procedure Date  . Cardiac catheterization     with stent placement    Family History  Problem Relation Age of Onset  . Coronary artery disease Other   . Leukemia Mother   . Prostate cancer Father    Social History:  reports that he quit smoking about 2 years ago. He does not have any smokeless tobacco history on file. He reports that he does not drink alcohol or use illicit drugs.  Allergies:  Allergies  Allergen Reactions  . Bee Venom Anaphylaxis and Hives  . Shellfish Allergy Anaphylaxis and Hives    Medications Prior to Admission  Medication Dose Route Frequency Provider Last Rate Last Dose  . aspirin tablet 325 mg  325 mg Oral  STAT Cyndra Numbers, MD   325 mg at 05/09/11 0230  . nitroGLYCERIN (NITROSTAT) SL tablet 0.4 mg  0.4 mg Sublingual Q5 min PRN Cyndra Numbers, MD       No current outpatient prescriptions on file as of 05/09/2011.    Results for orders placed during the hospital encounter of 05/09/11 (from the past 48 hour(s))  CBC     Status: Normal   Collection Time   05/09/11  2:04 AM      Component Value Range Comment   WBC 6.6  4.0 - 10.5 (K/uL)    RBC 4.93  4.22 - 5.81 (MIL/uL)    Hemoglobin 13.8  13.0 - 17.0 (g/dL)    HCT 40.9  81.1 - 91.4 (%)    MCV 83.4  78.0 - 100.0  (fL)    MCH 28.0  26.0 - 34.0 (pg)    MCHC 33.6  30.0 - 36.0 (g/dL)    RDW 78.2  95.6 - 21.3 (%)    Platelets 255  150 - 400 (K/uL)   BASIC METABOLIC PANEL     Status: Abnormal   Collection Time   05/09/11  2:04 AM      Component Value Range Comment   Sodium 138  135 - 145 (mEq/L)    Potassium 4.2  3.5 - 5.1 (mEq/L)    Chloride 103  96 - 112 (mEq/L)    CO2 25  19 - 32 (mEq/L)    Glucose, Bld 121 (*) 70 - 99 (mg/dL)    BUN 12  6 - 23 (mg/dL)    Creatinine, Ser 0.86  0.50 - 1.35 (mg/dL)    Calcium 9.6  8.4 - 10.5 (mg/dL)    GFR calc non Af Amer >90  >90 (mL/min)    GFR calc Af Amer >90  >90 (mL/min)   POCT I-STAT TROPONIN I     Status: Normal   Collection Time   05/09/11  2:24 AM      Component Value Range Comment   Troponin i, poc 0.00  0.00 - 0.08 (ng/mL)    Comment 3             Dg Chest 2 View  05/09/2011  *RADIOLOGY REPORT*  Clinical Data: Worsening intermittent chest pain.  CHEST - 2 VIEW  Comparison: Chest radiograph performed 02/22/2011  Findings: The lungs are mildly hypoexpanded but appear clear. There is no evidence of focal opacification, pleural effusion or pneumothorax.  The heart is borderline normal in size; the mediastinal contour is within normal limits. A vascular stent is noted along the left mediastinum.  There is mild stable prominence of the hila.  No acute osseous abnormalities are seen.  IMPRESSION: Mildly hypoexpanded but clear lungs.  Original Report Authenticated By: Tonia Ghent, M.D.    ROS: Gen: no colds or fevers recently. Skin denies rashes. HEENT no blurred vision, no headaches. Cardiovascular: See HPI. Pulmonary: Denies shortness of breath. GI: Denies diarrhea ,constipation or melena. GU: Denies hematuria or dysuria.  Musculoskeletal: Has arthritic pains of back and shoulders. Endocrine: Has diabetes which he states is controlled. Neuro: No lightheadedness, dizziness or syncope.  Blood pressure 132/93, pulse 79, temperature 97.8 F (36.6  C), temperature source Oral, resp. rate 15, SpO2 98.00%. PE:  General: Alert oriented African American male currently in no acute distress with pleasant affect. Skin: Warm and dry, brisk capillary refill. HEENT: normocephalic, sclera clear. Neck: Supple, no JVD, no bruits. Heart: S1-S2, regular rate and rhythm, no murmur, gallop, rub or  click. Lungs: Clear, without rales, rhonchi or wheezes. Abdomen: Soft, nontender, positive bowel sounds, do not palpate liver, spleen or masses. Extremities: No lower extremity edema, 2+ pedal pulses bilaterally, 2+ radial pulses bilaterally Neuro: Alert oriented x3, follows commands, moves all extremities.   Assessment/Plan Patient Active Problem List  Diagnoses  . DM  . CAD  . DYSPEPSIA&OTHER SPEC DISORDERS FUNCTION STOMACH  . Crescendo angina   PLAN:patient now presents with crescendo angina occurring over the last week. Will admit to telemetry, increases oral nitrate. Place on Lovenox subcutaneous per acute coronary guidelines. Will proceed with serial CK-MB and troponin I's. Plan will be to either proceed with nuclear stress test unless cardiac enzymes are positive then we'll proceed with cardiac catheterization.  MWUXLK,GMWNU R 05/09/2011, 7:44 AM  Patient examined and evaluated with Nada Boozer. Agree with her history and physical. High likelihood he will require repeat coronary angiography. It was felt that he will probably require brachytherapy for recurrent aggressive in-stent restenosis.

## 2011-05-10 LAB — LIPID PANEL
Cholesterol: 185 mg/dL (ref 0–200)
LDL Cholesterol: 121 mg/dL — ABNORMAL HIGH (ref 0–99)
Triglycerides: 115 mg/dL (ref <150)

## 2011-05-10 LAB — BASIC METABOLIC PANEL
BUN: 9 mg/dL (ref 6–23)
CO2: 27 mEq/L (ref 19–32)
GFR calc non Af Amer: >90 mL/min (ref >90)
Glucose, Bld: 141 mg/dL — ABNORMAL HIGH (ref 70–99)
Potassium: 4.4 mEq/L (ref 3.5–5.1)

## 2011-05-10 LAB — GLUCOSE, CAPILLARY: Glucose-Capillary: 152 mg/dL — ABNORMAL HIGH (ref 70–99)

## 2011-05-10 LAB — CBC
HCT: 39.8 % (ref 39.0–52.0)
Hemoglobin: 13.1 g/dL (ref 13.0–17.0)
MCH: 27.9 pg (ref 26.0–34.0)
MCHC: 32.9 g/dL (ref 30.0–36.0)
RBC: 4.7 MIL/uL (ref 4.22–5.81)

## 2011-05-10 LAB — TSH: TSH: 0.792 u[IU]/mL (ref 0.350–4.500)

## 2011-05-10 MED ORDER — SODIUM CHLORIDE 0.9 % IJ SOLN: 3.0000 mL | Freq: Two times a day (BID) | INTRAMUSCULAR | Status: DC

## 2011-05-10 MED ORDER — SODIUM CHLORIDE 0.9 % IV SOLN: 250.0000 mL | INTRAVENOUS | Status: DC

## 2011-05-10 MED ORDER — DIAZEPAM 5 MG PO TABS
5.0000 mg | ORAL_TABLET | ORAL | Status: AC
  Administered 2011-05-11: 5 mg via ORAL
  Filled 2011-05-10: qty 1

## 2011-05-10 MED ORDER — SODIUM CHLORIDE 0.9 % IJ SOLN: 3.0000 mL | INTRAMUSCULAR | Status: DC | PRN

## 2011-05-10 MED ORDER — SODIUM CHLORIDE 0.9 % IV SOLN
INTRAVENOUS | Status: DC
  Administered 2011-05-11 (×2): via INTRAVENOUS

## 2011-05-10 NOTE — Progress Notes (Signed)
Duplicate

## 2011-05-10 NOTE — Progress Notes (Signed)
[facility] DAILY PROGRESS NOTE  [name]   [id number] [birth year]      Patient Description   50 y.o. male with PMH: Pulmonary disease with 3 drug-eluting stents placed in [place] with stents placed in the circumflex and RCA. He has had many recent events is on the circumflex stent. His last heart catheterization in July 2004 for recurrent chest pain and found to have aggressive in-stent restenosis and balloon atherectomy of the AV groove circumflex drug eluding stent placement of that time. He now presented with crescendo angina. He has ruled out for myocardial infarction but has been treated for acute coronary syndrome.     Subjective:  No further episodes of chest pain. No shortness of breath.  Objective:  Temp:  [97.7 F (36.5 C)-98.3 F (36.8 C)] 97.9 F (36.6 C) (11/11 1000) Pulse Rate:  [65-84] 72  (11/11 1103) Resp:  [18-20] 20  (11/11 1000) BP: (111-143)/(68-91) 128/68 mmHg (11/11 1107) SpO2:  [97 %] 97 % (11/11 1000) Weight:  [92.9 kg (204 lb 12.9 oz)] 204 lb 12.9 oz (92.9 kg) (11/11 0427) Weight change:   Intake/Output from previous day: 11/10 0701 - 11/11 0700 In: 480 [P.O.:480] Out: 1850 [Urine:1850] Intake/Output from this shift: Total I/O In: 360 [P.O.:360] Out: 800 [Urine:800]  Physical Exam: General appearance: alert, cooperative, appears stated age and no distress Neck: no adenopathy, no carotid bruit, no JVD, supple, symmetrical, trachea midline and thyroid not enlarged, symmetric, no tenderness/mass/nodules Lungs: clear to auscultation bilaterally and non-labored. Normal excursion, clear to percussion Heart: regular rate and rhythm, S1, S2 normal, no murmur, click, rub or gallop and non-displaced PMI. Abdomen: soft, non-tender; bowel sounds normal; no masses,  no organomegaly Extremities: extremities normal, atraumatic, no cyanosis or edema Pulses: 2+ and symmetric Neurologic: Alert and oriented X 3, normal strength and  tone. Normal symmetric reflexes. Normal coordination and gait  Lab Results: CBC    Component Value Date/Time   WBC 4.4 05/10/2011 0638   RBC 4.70 05/10/2011 0638   HGB 13.1 05/10/2011 0638   HCT 39.8 05/10/2011 0638   PLT 222 05/10/2011 0638   MCV 84.7 05/10/2011 0638   MCH 27.9 05/10/2011 0638   MCHC 32.9 05/10/2011 0638   RDW 12.3 05/10/2011 0638   LYMPHSABS 1.7 04/25/2011 1315   MONOABS 0.7 04/25/2011 1315   EOSABS 0.1 04/25/2011 1315   BASOSABS 0.0 04/25/2011 1315   CMP     Component Value Date/Time   NA 140 05/10/2011 0638   K 4.4 05/10/2011 0638   CL 105 05/10/2011 0638   CO2 27 05/10/2011 0638   GLUCOSE 141* 05/10/2011 0638   BUN 9 05/10/2011 0638   CREATININE 0.81 05/10/2011 0638   CALCIUM 9.5 05/10/2011 0638   PROT 6.6 05/09/2011 0724   ALBUMIN 3.8 05/09/2011 0724   AST 19 05/09/2011 0724   ALT 28 05/09/2011 0724   ALKPHOS 56 05/09/2011 0724   BILITOT 0.3 05/09/2011 0724   GFRNONAA >90 05/10/2011 0638   GFRAA >90 05/10/2011 4696   Cardiac enzymes: CK MB/troponin I negative x4 sets; rule out MI  MAR Reviewed Aspirin 325 mg daily, carvedilol 25 mg by mouth twice a day, Benadryl 75 mg by mouth daily, Lovenox 90 mcg subcutaneous twice a day, sliding scale insulin and 60 mg by mouth daily, lisinopril 5 mg daily, Crestor 5 mg daily. Sublingual nitroglycerin when necessary    Assessment/Plan:  50 year old man with a history of hepatitic coronary artery disease admitted with increasing  anginal chest pain.   Principal Problem:   *Crescendo angina  Active Problems:   DM   CAD 1. HLD -- on crestor 2. HTN - BP controlled  Plan: Continue anticoagulation with Lovenox and dual antiplatelet therapy.  Proceed with cardiac catheterization tomorrow, consider IVUS of the circumflex if this is determined to be the culprit vessel. Based upon evaluation and he may require brachytherapy for recurrent aggressive in-stent restenosis.   CONSENT: Procedure:  Left heart  catheterization with possible percutaneous intervention, possible IVUS  The procedure with Risks/Benefits/Alternatives and Indications was reviewed with the patient.  All questions were answered.    Risks / Complications include, but not limited to: Death, MI, CVA/TIA, VF/VT (with defibrillation), Bradycardia (need for temporary pacer placement), contrast induced nephropathy, bleeding / bruising / hematoma / pseudoaneurysm, vascular or coronary injury (with possible emergent CT or Vascular Surgery), adverse medication reactions, infection.    The patient voice understanding and agree to proceed.   I have signed the consent form and placed it on the chart for patient signature and RN witness.     Time Spent Directly with Patient:  10 minutes; 10 min with chart  Length of Stay:  LOS: 1 day    [name] W 05/10/2011, 2:29 PM

## 2011-05-11 ENCOUNTER — Encounter (HOSPITAL_COMMUNITY)
Admission: EM | Disposition: A | Discharge status: Home / Self Care | Payer: Self-pay | Source: Home / Self Care | Attending: Cardiovascular Disease

## 2011-05-11 HISTORY — PX: LEFT HEART CATHETERIZATION WITH CORONARY ANGIOGRAM: SHX5451

## 2011-05-11 HISTORY — PX: CARDIAC CATHETERIZATION: SHX172

## 2011-05-11 LAB — BASIC METABOLIC PANEL
Calcium: 9.4 mg/dL (ref 8.4–10.5)
Creatinine, Ser: 0.75 mg/dL (ref 0.50–1.35)
GFR calc Af Amer: >90 mL/min (ref >90)
GFR calc non Af Amer: >90 mL/min (ref >90)
Sodium: 141 mEq/L (ref 135–145)

## 2011-05-11 LAB — PROTIME-INR
INR: 1 (ref 0.00–1.49)
Prothrombin Time: 13.4 seconds (ref 11.6–15.2)

## 2011-05-11 LAB — PLATELET INHIBITION P2Y12: Platelet Function  P2Y12: 140 [PRU] — ABNORMAL LOW (ref 194–418)

## 2011-05-11 LAB — CBC
Hemoglobin: 14.2 g/dL (ref 13.0–17.0)
MCH: 28.2 pg (ref 26.0–34.0)
MCHC: 33.6 g/dL (ref 30.0–36.0)
RDW: 12.4 % (ref 11.5–15.5)

## 2011-05-11 LAB — GLUCOSE, CAPILLARY: Glucose-Capillary: 266 mg/dL — ABNORMAL HIGH (ref 70–99)

## 2011-05-11 SURGERY — LEFT HEART CATHETERIZATION WITH CORONARY ANGIOGRAM
Anesthesia: LOCAL

## 2011-05-11 SURGERY — LEFT HEART CATHETERIZATION WITH CORONARY ANGIOGRAM
Anesthesia: Moderate Sedation

## 2011-05-11 MED ORDER — LIDOCAINE HCL (PF) 1 % IJ SOLN
INTRAMUSCULAR | Status: AC
  Filled 2011-05-11: qty 30

## 2011-05-11 MED ORDER — ASPIRIN 81 MG PO CHEW
CHEWABLE_TABLET | ORAL | Status: AC
  Filled 2011-05-11: qty 4

## 2011-05-11 MED ORDER — ISOSORBIDE MONONITRATE ER 60 MG PO TB24: 60.0000 mg | ORAL_TABLET | Freq: Every day | ORAL | Status: DC

## 2011-05-11 MED ORDER — FAMOTIDINE IN NACL 20-0.9 MG/50ML-% IV SOLN
20.0000 mg | Freq: Once | INTRAVENOUS | Status: AC
  Administered 2011-05-11: 20 mg via INTRAVENOUS
  Filled 2011-05-11: qty 50

## 2011-05-11 MED ORDER — HEPARIN (PORCINE) IN NACL 2-0.9 UNIT/ML-% IJ SOLN
INTRAMUSCULAR | Status: AC
  Filled 2011-05-11: qty 2000

## 2011-05-11 MED ORDER — NITROGLYCERIN 0.2 MG/ML ON CALL CATH LAB
INTRAVENOUS | Status: AC
  Filled 2011-05-11: qty 1

## 2011-05-11 MED ORDER — SODIUM CHLORIDE 0.9 % IV SOLN: INTRAVENOUS | Status: DC

## 2011-05-11 MED ORDER — CARVEDILOL 25 MG PO TABS
25.0000 mg | ORAL_TABLET | Freq: Two times a day (BID) | ORAL | Status: DC
  Administered 2011-05-11 – 2011-05-12 (×2): 25 mg via ORAL
  Filled 2011-05-11 (×4): qty 1

## 2011-05-11 MED ORDER — MIDAZOLAM HCL 2 MG/2ML IJ SOLN
INTRAMUSCULAR | Status: AC
  Filled 2011-05-11: qty 2

## 2011-05-11 MED ORDER — METHYLPREDNISOLONE SODIUM SUCC 125 MG IJ SOLR
125.0000 mg | Freq: Once | INTRAMUSCULAR | Status: AC
  Administered 2011-05-11: 125 mg via INTRAVENOUS
  Filled 2011-05-11: qty 2

## 2011-05-11 MED ORDER — DIPHENHYDRAMINE HCL 50 MG/ML IJ SOLN
25.0000 mg | Freq: Once | INTRAMUSCULAR | Status: AC
  Administered 2011-05-11: 25 mg via INTRAVENOUS
  Filled 2011-05-11: qty 1

## 2011-05-11 MED ORDER — FENTANYL CITRATE 0.05 MG/ML IJ SOLN
INTRAMUSCULAR | Status: AC
  Filled 2011-05-11: qty 2

## 2011-05-11 NOTE — Cardiovascular Report (Signed)
NAMEJOFFRE, LUCKS                ACCOUNT NO.:  [id number]  MEDICAL RECORD NO.:  [id number]  LOCATION:  4708                         FACILITY:  [facility]  PHYSICIAN:  Nicki Guadalajara, M.D.     DATE OF BIRTH:  [birth year]  DATE OF PROCEDURE:  05/11/2011 DATE OF DISCHARGE:                           CARDIAC CATHETERIZATION   INDICATIONS:  [name] is a 50 year old African American gentleman who has established coronary artery disease.  He is status post initial placement of 3 drug-eluting stents while in [place] with 2 stents being placed in the circumflex and initially 1 in the right coronary artery.  In October 2011, he developed 70% in-stent restenosis at the distal portion of the circumflex stent and 70% in-stent restenosis of his mid right coronary artery.  At that time, he underwent cutting balloon arthrotomy of the RCA and had a new stent placed to his circumflex.  Catheterization in February 2012 showed 20-30% mild narrowing in the right coronary artery stent but 90% diffuse in-stent restenosis in the area of stent overlap in the circumflex with cutting balloon intervention.  Apparently, in July, 2012, he was found to have additional circumflex restenosis and insertion of a new Taxus Ion drug- eluting stent in the circumflex system.  The patient was admitted to [facility] this weekend with chest pain with some similar characteristics.  He now presents for cardiac catheterization.  PROCEDURE:  After premedication with Versed 2 mg plus fentanyl 50 mcg, the patient was prepped and draped in usual fashion.  His right femoral artery was punctured anteriorly and a 5-French sheath was inserted without difficulty.  Diagnostic cardiac catheterization was done utilizing 5-French Judkins 4 left and right coronary catheters.  A 5- French pigtail catheter was used for biplane cine left ventriculography. Hemostasis was obtained by direct manual pressure.  The patient tolerated  the procedure well.  HEMODYNAMIC DATA:  Central aortic pressure was 124/84.  Left ventricular pressure 124/7, post A-wave 13.  ANGIOGRAPHIC DATA:  Left main coronary artery was angiographically normal and trifurcated into an LAD, a small high marginal intermediate like vessel and left circumflex coronary artery.  The LAD had mild 40% smooth somewhat eccentric stenosis after the second diagonal vessel in its mid segment before the third diagonal vessel. The remainder of the LAD was free of significant disease.  The high marginal/intermediate like vessel was small caliber and angiographically normal.  The circumflex vessel gave rise to 2 high proximal branches.  The circumflex vessel was stented just after this second proximal branch. There was 30-40% narrowing in the very proximal circumflex prior to the previously placed overlapping stents in the circumflex system.  The right coronary artery was a small caliber vessel, which gave rise to the PDA, but did not supply the PLA system.  The stent in the mid segment was free of significant disease.  There was mild 20% narrowing beyond the acute margin proximal to the PDA takeoff.  Biplane cine left ventriculography revealed normal global LV contractility with an ejection fraction of 55%.  On the RAO projection, contractility appeared normal.  On the LAO projection, there was minimal mild residual mid posterolateral hypocontractility.  IMPRESSION: 1.  Preserved global left ventricular function with very mild mid     posterolateral hypocontractility. 2. A 40% mid left anterior descending stenosis. 3. No significant restenosis in previously placed tandem overlapping     stents in the circumflex vessel with smooth 30-40% narrowing just     proximal to the stented segment in the very proximal circumflex     system. 4. Patent mid RCA stent with smooth 20% narrowing in the distal right     coronary artery.  RECOMMENDATION:  Increase  medical therapy.          ______________________________ Nicki Guadalajara, M.D.     TK/MEDQ  D:  05/11/2011  T:  05/11/2011  Job:  [id number]

## 2011-05-11 NOTE — Progress Notes (Signed)
Subjective: No further chest pain Principal Problem:  *Crescendo angina Active Problems:  DM  CAD - multiple PCI to LCx (DES & Cutting Balloon PTCA), also stent to RCA  Hypertension  Hyperlipidemia    Objective: Vital signs in last 24 hours: Temp:  [97.8 F (36.6 C)-98.1 F (36.7 C)] 97.9 F (36.6 C) (11/12 1036) Pulse Rate:  [62-75] 63  (11/12 1036) Resp:  [18-20] 20  (11/12 1036) BP: (117-153)/(71-98) 153/98 mmHg (11/12 1036) SpO2:  [96 %-99 %] 99 % (11/12 1036) Weight:  [92.6 kg (204 lb 2.3 oz)] 204 lb 2.3 oz (92.6 kg) (11/12 0452) Weight change: 0.8 kg (1 lb 12.2 oz) Last BM Date: 05/10/11 Telemetry: Normal sinus rhythm Intake/Output from previous day: 11/11 0701 - 11/12 0700 In: 960 [P.O.:960] Out: 1801 [Urine:1800; Stool:1] Intake/Output this shift: Total I/O In: 0  Out: 750 [Urine:750] Medications: I have reviewed the patient's current medications.  General appearance: alert, cooperative and appears stated age Neck: no adenopathy, no carotid bruit, supple, symmetrical, trachea midline and thyroid not enlarged, symmetric, no tenderness/mass/nodules Resp: clear to auscultation bilaterally Cardio: regular rate and rhythm, S1, S2 normal, no murmur, click, rub or gallop Extremities: extremities normal, atraumatic, no cyanosis or edema  Lab Results:  [facility] 05/11/11 0655 05/10/11 0638  WBC 4.7 4.4  HGB 14.2 13.1  HCT 42.3 39.8  PLT 230 222   BMET  Basename 05/11/11 0655 05/10/11 0638  NA 141 140  K 4.1 4.4  CL 107 105  CO2 25 27  GLUCOSE 146* 141*  BUN 8 9  CREATININE 0.75 0.81  CALCIUM 9.4 9.5   Cardiac Enzymes Lab Results  Component Value Date   CKTOTAL 321* 05/09/2011   CKMB 4.4* 05/09/2011   TROPONINI <0.30 05/09/2011    Studies/Results: No results found.  Orders placed during the hospital encounter of 05/09/11  . EKG 12-LEAD  . EKG 12-LEAD  . EKG 12-LEAD      Assessment/Plan: See plan from Dr. Herbie Baltimore.  Plan cardiac cath  today.  LOS: 2 days   [name] A 05/11/2011, 11:42 AM

## 2011-05-11 NOTE — Procedures (Signed)
CARDIAC CATHERIZATION  [name], 50 y.o., male  DICTATION # [phone], [id number]  KELLY,THOMAS A, 05/11/2011, 1:01 PM

## 2011-05-12 ENCOUNTER — Encounter (HOSPITAL_COMMUNITY): Payer: Self-pay

## 2011-05-12 DIAGNOSIS — Z91041 Radiographic dye allergy status: Secondary | ICD-10-CM | POA: Diagnosis present

## 2011-05-12 LAB — GLUCOSE, CAPILLARY: Glucose-Capillary: 349 mg/dL — ABNORMAL HIGH (ref 70–99)

## 2011-05-12 MED ORDER — LISINOPRIL 5 MG PO TABS: 5.0000 mg | ORAL_TABLET | Freq: Every day | ORAL | Status: DC

## 2011-05-12 MED ORDER — METFORMIN HCL ER (OSM) 1000 MG PO TB24: 1000.0000 mg | ORAL_TABLET | Freq: Two times a day (BID) | ORAL | Status: DC

## 2011-05-12 MED ORDER — DEXTROSE 50 % IV SOLN
INTRAVENOUS | Status: AC
  Filled 2011-05-12: qty 50

## 2011-05-12 MED ORDER — NITROGLYCERIN 0.4 MG SL SUBL: 0.4000 mg | SUBLINGUAL_TABLET | SUBLINGUAL | Status: DC | PRN

## 2011-05-12 MED ORDER — ROSUVASTATIN CALCIUM 5 MG PO TABS: 5.0000 mg | ORAL_TABLET | Freq: Every day | ORAL | Status: DC

## 2011-05-12 MED ORDER — ISOSORBIDE MONONITRATE ER 60 MG PO TB24: 60.0000 mg | ORAL_TABLET | Freq: Every day | ORAL | Status: DC

## 2011-05-12 NOTE — Discharge Summary (Signed)
Dayani Winbush C. Karah Caruthers, MD Attending Cardiologist [facility]  

## 2011-05-12 NOTE — Progress Notes (Signed)
  Subjective:  No chest pain.  Objective:  Vital Signs in the last 24 hours: Temp:  [97.6 F (36.4 C)-97.7 F (36.5 C)] 97.7 F (36.5 C) (11/13 0449) Pulse Rate:  [67-98] 88  (11/13 0816) Resp:  [18-20] 20  (11/13 0449) BP: (118-162)/(76-99) 147/99 mmHg (11/13 0816) SpO2:  [96 %-98 %] 96 % (11/13 0449) Weight:  [94.2 kg (207 lb 10.8 oz)] 207 lb 10.8 oz (94.2 kg) (11/13 0449)  Intake/Output from previous day: 11/12 0701 - 11/13 0700 In: 1190 [P.O.:240; I.V.:900; IV Piggyback:50] Out: 2350 [Urine:2350]  Physical Exam: General appearance: alert, cooperative and no distress Heart: regular rate and rhythm, S1, S2 normal, no murmur, click, rub or gallop Abdomen: soft, non-tender; bowel sounds normal; no masses,  no organomegaly Extremities: extremities normal, atraumatic, no cyanosis or edema, no hematoma rt groin. Chest clear   Rate: 70  Rhythm: normal sinus rhythm  Lab Results:  Basename 05/11/11 0655 05/10/11 0638  WBC 4.7 4.4  HGB 14.2 13.1  PLT 230 222    Basename 05/11/11 0655 05/10/11 0638  NA 141 140  K 4.1 4.4  CL 107 105  CO2 25 27  GLUCOSE 146* 141*  BUN 8 9  CREATININE 0.75 0.81    Basename 05/09/11 1526  TROPONINI <0.30   Hepatic Function Panel No results found for this basename: PROT,ALBUMIN,AST,ALT,ALKPHOS,BILITOT,BILIDIR,IBILI in the last 72 hours  Basename 05/10/11 0638  CHOL 185   No results found for this basename: PROTIME in the last 72 hours  Imaging: No results found.  Cardiac Studies:  Assessment/Plan:   Principal Problem:  *Crescendo angina Active Problems:  DM  CAD - multiple PCI to LCx (DES & Cutting Balloon PTCA), also stent to RCA  Hypertension  Hyperlipidemia  Allergy to contrast media (used for diagnostic x-rays)   Plan- Will review cath findings with MD.    Corine Shelter PA-C 05/12/2011, 1:24 PM   Cath results reviewed. Pt is ready for dc.Kinzy Weyers B

## 2011-05-12 NOTE — Progress Notes (Signed)
Note CBG up to 315 mg/dL.  Patient received one time dose of Methylprednisolone 125 mg/dL x 1 yesterday.   A1c is 6.1% indicating good control prior to admit. CBG's should improve since this was a one time dose.  Will follow.  No recommendations at this time.

## 2011-05-12 NOTE — Discharge Summary (Signed)
Physician Discharge Summary  Patient ID: BRALIN GARRY MRN: [id number] DOB/AGE: [birth year] 50 y.o.  Admit date: 05/09/2011 Discharge date: 05/12/2011  Admission Diagnoses:  Discharge Diagnoses:  Principal Problem:  *Crescendo angina Active Problems:  DM  CAD - multiple PCI to LCx (DES & Cutting Balloon PTCA), also stent to RCA  Hypertension  Hyperlipidemia  Allergy to contrast media (used for diagnostic x-rays)   Discharged Condition: stable  Hospital Course: Mr Gasparro was admitted with symptoms consistent with unstable angina. MI was ruled out. He was re-cathed by Dr Tresa Endo. Previous stent are patent. He tolorated this well. We feel he can be d/c 05/12/11. We have increased his med RX.   Consults: none  Significant Diagnostic Studies: labs: troponin negative  Treatments:  Discharge Exam: Blood pressure 135/85, pulse 93, temperature 99.1 F (37.3 C), temperature source Oral, resp. rate 20, height 5\' 9"  (1.753 m), weight 94.2 kg (207 lb 10.8 oz), SpO2 96.00%.   Disposition: Home or Self Care   Current Discharge Medication List    START taking these medications   Details  isosorbide mononitrate (IMDUR) 60 MG 24 hr tablet Take 1 tablet (60 mg total) by mouth daily. Qty: 30 tablet, Refills: 5    lisinopril (PRINIVIL,ZESTRIL) 5 MG tablet Take 1 tablet (5 mg total) by mouth daily. Qty: 30 tablet, Refills: 5    nitroGLYCERIN (NITROSTAT) 0.4 MG SL tablet Place 1 tablet (0.4 mg total) under the tongue every 5 (five) minutes as needed for chest pain. Qty: 25 tablet, Refills: 2    rosuvastatin (CRESTOR) 5 MG tablet Take 1 tablet (5 mg total) by mouth daily. Qty: 30 tablet, Refills: 5      CONTINUE these medications which have CHANGED   Details  metformin (FORTAMET) 1000 MG (OSM) 24 hr tablet Take 1 tablet (1,000 mg total) by mouth 2 (two) times daily with a meal. Qty: 1 tablet, Refills: 1      CONTINUE these medications which have NOT CHANGED   Details  aspirin 325 MG  EC tablet Take 325 mg by mouth daily.      carvedilol (COREG) 12.5 MG tablet Take 12.5 mg by mouth 2 (two) times daily with a meal.     clopidogrel (PLAVIX) 75 MG tablet Take 75 mg by mouth daily.      testosterone enanthate (DELATESTRYL) 200 MG/ML injection Inject into the muscle every 14 (fourteen) days. For IM use only       STOP taking these medications     isosorbide dinitrate (ISORDIL) 30 MG tablet        Follow-up Information    Follow up with HILTY,Kenneth C. (office will call you)    Contact information:   [address] [place] [address] [phone]          Signed: Abelino Derrick 05/12/2011, 2:57 PM

## 2011-05-12 NOTE — Progress Notes (Signed)
IV d/c'd. Tele d/c'd. Pt d/c'd to home.  Home meds and d/c instructions have been reviewed and discussed with pt.  PT denies any questions or concerns at this time. Pt leaving the unit at this time and appears in no acute distress. Nino Glow RN

## 2011-07-09 ENCOUNTER — Emergency Department (HOSPITAL_COMMUNITY)
Admission: EM | Admit: 2011-07-09 | Discharge: 2011-07-09 | Disposition: A | Discharge status: Home / Self Care | Payer: Medicaid Other | Attending: Emergency Medicine | Admitting: Emergency Medicine

## 2011-07-09 ENCOUNTER — Encounter (HOSPITAL_COMMUNITY): Payer: Self-pay | Admitting: Emergency Medicine

## 2011-07-09 DIAGNOSIS — I10 Essential (primary) hypertension: Secondary | ICD-10-CM | POA: Insufficient documentation

## 2011-07-09 DIAGNOSIS — Z79899 Other long term (current) drug therapy: Secondary | ICD-10-CM | POA: Insufficient documentation

## 2011-07-09 DIAGNOSIS — K5909 Other constipation: Secondary | ICD-10-CM | POA: Insufficient documentation

## 2011-07-09 DIAGNOSIS — E119 Type 2 diabetes mellitus without complications: Secondary | ICD-10-CM | POA: Insufficient documentation

## 2011-07-09 DIAGNOSIS — K5903 Drug induced constipation: Secondary | ICD-10-CM

## 2011-07-09 DIAGNOSIS — I2510 Atherosclerotic heart disease of native coronary artery without angina pectoris: Secondary | ICD-10-CM | POA: Insufficient documentation

## 2011-07-09 MED ORDER — LACTULOSE 10 GM/15ML PO SOLN
30.0000 g | Freq: Once | ORAL | Status: AC
  Administered 2011-07-09: 30 g via ORAL
  Filled 2011-07-09: qty 45

## 2011-07-09 MED ORDER — SENNOSIDES-DOCUSATE SODIUM 8.6-50 MG PO TABS: 2.0000 | ORAL_TABLET | Freq: Every day | ORAL | Status: DC | PRN

## 2011-07-09 NOTE — ED Notes (Signed)
States that he had a small BM yesterday. Used a suppository this am with no result. Second visit states that he believes that the Tramadol is making him constipated.

## 2011-07-09 NOTE — ED Provider Notes (Signed)
History     CSN: [id number]  Arrival date & time 07/09/11  0720   First MD Initiated Contact with Patient 07/09/11 [phone]      Chief Complaint  Patient presents with  . Constipation    (Consider location/radiation/quality/duration/timing/severity/associated sxs/prior treatment) HPI Comments: The patient presents for evaluation of essentially one day of constipation, reporting that he had a firm and hard stool that he passed yesterday with much effort, but without blood or mucus. He reports that today he feels as though he needs to have a bowel movement but was unable to pass stool. He tried a rectal suppository at home without any success. He denies abdominal pain, nausea, vomiting, blood in the stool, or decreased oral intake. He reports recently using tramadol for pain and sites that as a provocative factor for constipation in the past. He tells me "I think I have a fecal impaction". Rectal exam reveals some hard stool out of reach of my gloved finger. No hemorrhoids or blood in the rectum.  Patient is a 51 y.o. male presenting with constipation. The history is provided by the patient.  Constipation  The current episode started yesterday. The onset was gradual. The problem has been gradually worsening. The patient is experiencing no pain. The stool is described as hard. Prior successful therapies include enemas. There was no prior unsuccessful therapy. Pertinent negatives include no anorexia, no fever, no abdominal pain, no diarrhea, no hematemesis, no hemorrhoids, no nausea, no rectal pain, no vomiting, no hematuria, no chest pain, no headaches, no coughing, no difficulty breathing and no rash. He has been behaving normally. He has been eating and drinking normally. His past medical history does not include abdominal surgery, inflammatory bowel disease or recent abdominal injury. Past medical history comments: Recent use of tramadol for pain. There were no sick contacts. He has received no recent  medical care.    Past Medical History  Diagnosis Date  . Coronary artery disease   . Hypertension   . Diabetes mellitus   . Hypercholesterolemia   . Angina     Past Surgical History  Procedure Date  . Cardiac catheterization     with stent placement  . Coronary angioplasty   . Coronary angioplasty with stent placement 2010 and 2011    x 2 cardaic stents    Family History  Problem Relation Age of Onset  . Coronary artery disease Other   . Leukemia Mother   . Prostate cancer Father     History  Substance Use Topics  . Smoking status: Former Smoker    Quit date: 04/07/2009  . Smokeless tobacco: Not on file  . Alcohol Use: No      Review of Systems  Constitutional: Negative for fever, appetite change and fatigue.  Respiratory: Negative for cough and shortness of breath.   Cardiovascular: Negative for chest pain.  Gastrointestinal: Positive for constipation. Negative for nausea, vomiting, abdominal pain, diarrhea, blood in stool, abdominal distention, anal bleeding, rectal pain, anorexia, hematemesis and hemorrhoids.  Genitourinary: Negative for frequency, hematuria and flank pain.  Skin: Negative for color change and rash.  Neurological: Negative for light-headedness and headaches.  Hematological: Does not bruise/bleed easily.  Psychiatric/Behavioral: Negative.     Allergies  Bee venom and Shellfish allergy  Home Medications   Current Outpatient Rx  Name Route Sig Dispense Refill  . ASPIRIN 325 MG PO TBEC Oral Take 325 mg by mouth daily.      Marland Kitchen CARVEDILOL 12.5 MG PO TABS Oral Take 12.5  mg by mouth 2 (two) times daily with a meal.     . CLOPIDOGREL BISULFATE 75 MG PO TABS Oral Take 75 mg by mouth daily.      . ISOSORBIDE MONONITRATE ER 60 MG PO TB24 Oral Take 1 tablet (60 mg total) by mouth daily. 30 tablet 5  . LISINOPRIL 5 MG PO TABS Oral Take 1 tablet (5 mg total) by mouth daily. 30 tablet 5  . METFORMIN HCL ER (OSM) 1000 MG PO TB24 Oral Take 1 tablet  (1,000 mg total) by mouth 2 (two) times daily with a meal. 1 tablet 1    Resume on 11/15  . NITROGLYCERIN 0.4 MG SL SUBL Sublingual Place 1 tablet (0.4 mg total) under the tongue every 5 (five) minutes as needed for chest pain. 25 tablet 2  . ROSUVASTATIN CALCIUM 5 MG PO TABS Oral Take 1 tablet (5 mg total) by mouth daily. 30 tablet 5  . TESTOSTERONE ENANTHATE 200 MG/ML IM OIL Intramuscular Inject into the muscle every 14 (fourteen) days. For IM use only     . TRAMADOL HCL 50 MG PO TABS Oral Take 50 mg by mouth every 6 (six) hours as needed. pain      BP 142/92  Pulse 76  Temp(Src) 97.7 F (36.5 C) (Oral)  Resp 19  SpO2 96%  Physical Exam  Nursing note and vitals reviewed. Constitutional: He is oriented to person, place, and time. He appears well-developed and well-nourished. No distress.  HENT:  Head: Normocephalic and atraumatic.  Mouth/Throat: Oropharynx is clear and moist.  Eyes: Conjunctivae and EOM are normal.  Neck: Normal range of motion.  Cardiovascular: Normal rate, regular rhythm, normal heart sounds and intact distal pulses.  Exam reveals no gallop and no friction rub.   No murmur heard. Pulmonary/Chest: Effort normal and breath sounds normal. No respiratory distress. He has no wheezes. He has no rales. He exhibits no tenderness.  Abdominal: Soft. Bowel sounds are normal. He exhibits no distension. There is no tenderness. There is no rebound and no guarding.  Genitourinary: Rectal exam shows no external hemorrhoid, no internal hemorrhoid, no fissure, no tenderness and anal tone normal.       Firm stool palpated with the tip of my gloved finger but just out of reach for disimpaction.  Musculoskeletal: Normal range of motion. He exhibits no edema and no tenderness.  Neurological: He is alert and oriented to person, place, and time. He has normal reflexes. No cranial nerve deficit. He exhibits normal muscle tone. Coordination normal.  Skin: Skin is warm and dry. No rash noted.  He is not diaphoretic. No erythema. No pallor.    ED Course  Procedures (including critical care time)  Labs Reviewed - No data to display No results found.   No diagnosis found.    MDM  With only one day of constipation, and without abdominal pain or nausea and vomiting, I am not worried about bowel obstruction in this patient who has never had abdominal surgery and would be at low risk for bowel obstruction. I suspect that this constipation secondary to the use of narcotic pain medication. I have ordered him a dose of lactulose for a laxative, and I will send him home with prescription for Senokot and have advised him to try an enema from the drug store if these means failed to yield a bowel movement. The patient states his understanding of and agreement with the plan of care.       Felisa Bonier,  MD 07/09/11 1610

## 2011-12-14 ENCOUNTER — Inpatient Hospital Stay (HOSPITAL_COMMUNITY)
Admission: EM | Admit: 2011-12-14 | Discharge: 2011-12-16 | DRG: 247 | Disposition: A | Discharge status: Home / Self Care | Payer: Medicaid Other | Attending: Cardiovascular Disease | Admitting: Cardiovascular Disease

## 2011-12-14 ENCOUNTER — Emergency Department (HOSPITAL_COMMUNITY): Payer: Medicaid Other

## 2011-12-14 DIAGNOSIS — R0789 Other chest pain: Secondary | ICD-10-CM | POA: Diagnosis present

## 2011-12-14 DIAGNOSIS — Z91041 Radiographic dye allergy status: Secondary | ICD-10-CM | POA: Diagnosis present

## 2011-12-14 DIAGNOSIS — I200 Unstable angina: Secondary | ICD-10-CM

## 2011-12-14 DIAGNOSIS — Y849 Medical procedure, unspecified as the cause of abnormal reaction of the patient, or of later complication, without mention of misadventure at the time of the procedure: Secondary | ICD-10-CM | POA: Diagnosis present

## 2011-12-14 DIAGNOSIS — T82897A Other specified complication of cardiac prosthetic devices, implants and grafts, initial encounter: Secondary | ICD-10-CM | POA: Diagnosis present

## 2011-12-14 DIAGNOSIS — K219 Gastro-esophageal reflux disease without esophagitis: Secondary | ICD-10-CM | POA: Diagnosis present

## 2011-12-14 DIAGNOSIS — I10 Essential (primary) hypertension: Secondary | ICD-10-CM | POA: Diagnosis present

## 2011-12-14 DIAGNOSIS — F1410 Cocaine abuse, uncomplicated: Secondary | ICD-10-CM | POA: Diagnosis present

## 2011-12-14 DIAGNOSIS — I2510 Atherosclerotic heart disease of native coronary artery without angina pectoris: Principal | ICD-10-CM | POA: Diagnosis present

## 2011-12-14 DIAGNOSIS — I252 Old myocardial infarction: Secondary | ICD-10-CM

## 2011-12-14 DIAGNOSIS — E785 Hyperlipidemia, unspecified: Secondary | ICD-10-CM | POA: Diagnosis present

## 2011-12-14 DIAGNOSIS — E119 Type 2 diabetes mellitus without complications: Secondary | ICD-10-CM | POA: Diagnosis present

## 2011-12-14 DIAGNOSIS — Z87891 Personal history of nicotine dependence: Secondary | ICD-10-CM

## 2011-12-14 HISTORY — DX: Low back pain, unspecified: M54.50

## 2011-12-14 HISTORY — DX: Other chronic pain: G89.29

## 2011-12-14 HISTORY — DX: Unspecified osteoarthritis, unspecified site: M19.90

## 2011-12-14 HISTORY — DX: Low back pain: M54.5

## 2011-12-14 HISTORY — DX: Peripheral vascular angioplasty status with implants and grafts: Z95.820

## 2011-12-14 HISTORY — DX: Type 2 diabetes mellitus without complications: E11.9

## 2011-12-14 HISTORY — DX: Gastro-esophageal reflux disease without esophagitis: K21.9

## 2011-12-14 HISTORY — DX: Acute myocardial infarction, unspecified: I21.9

## 2011-12-14 LAB — DIFFERENTIAL
Basophils Absolute: 0 10*3/uL (ref 0.0–0.1)
Basophils Relative: 0 % (ref 0–1)
Eosinophils Absolute: 0.1 10*3/uL (ref 0.0–0.7)
Eosinophils Relative: 2 % (ref 0–5)
Lymphs Abs: 2.6 10*3/uL (ref 0.7–4.0)
Neutrophils Relative %: 37 % — ABNORMAL LOW (ref 43–77)

## 2011-12-14 LAB — CARDIAC PANEL(CRET KIN+CKTOT+MB+TROPI)
Relative Index: 1.5 (ref 0.0–2.5)
Relative Index: 1.6 (ref 0.0–2.5)
Total CK: 331 U/L — ABNORMAL HIGH (ref 7–232)
Troponin I: <0.3 ng/mL (ref <0.30)

## 2011-12-14 LAB — CBC
MCH: 28.1 pg (ref 26.0–34.0)
MCHC: 34 g/dL (ref 30.0–36.0)
Platelets: 228 10*3/uL (ref 150–400)
RBC: 5.24 MIL/uL (ref 4.22–5.81)
RDW: 12.8 % (ref 11.5–15.5)

## 2011-12-14 LAB — POCT I-STAT, CHEM 8
Creatinine, Ser: 1 mg/dL (ref 0.50–1.35)
Hemoglobin: 15.6 g/dL (ref 13.0–17.0)
Sodium: 139 mEq/L (ref 135–145)
TCO2: 25 mmol/L (ref 0–100)

## 2011-12-14 LAB — GLUCOSE, CAPILLARY: Glucose-Capillary: 110 mg/dL — ABNORMAL HIGH (ref 70–99)

## 2011-12-14 LAB — POCT I-STAT TROPONIN I

## 2011-12-14 LAB — HEPARIN LEVEL (UNFRACTIONATED): Heparin Unfractionated: 0.48 IU/mL (ref 0.30–0.70)

## 2011-12-14 LAB — PLATELET INHIBITION P2Y12: Platelet Function  P2Y12: 104 [PRU] — ABNORMAL LOW (ref 194–418)

## 2011-12-14 MED ORDER — ACETAMINOPHEN 325 MG PO TABS: 650.0000 mg | ORAL_TABLET | ORAL | Status: DC | PRN

## 2011-12-14 MED ORDER — PANTOPRAZOLE SODIUM 40 MG PO TBEC
40.0000 mg | DELAYED_RELEASE_TABLET | Freq: Every day | ORAL | Status: DC
  Administered 2011-12-15 – 2011-12-16 (×2): 40 mg via ORAL
  Filled 2011-12-14: qty 1

## 2011-12-14 MED ORDER — NITROGLYCERIN 0.4 MG SL SUBL
0.4000 mg | SUBLINGUAL_TABLET | SUBLINGUAL | Status: DC | PRN
  Filled 2011-12-14: qty 25

## 2011-12-14 MED ORDER — CLOPIDOGREL BISULFATE 75 MG PO TABS
75.0000 mg | ORAL_TABLET | Freq: Once | ORAL | Status: AC
  Administered 2011-12-14: 75 mg via ORAL
  Filled 2011-12-14: qty 1

## 2011-12-14 MED ORDER — ONDANSETRON HCL 4 MG/2ML IJ SOLN: 4.0000 mg | Freq: Four times a day (QID) | INTRAMUSCULAR | Status: DC | PRN

## 2011-12-14 MED ORDER — CARVEDILOL 12.5 MG PO TABS
12.5000 mg | ORAL_TABLET | Freq: Two times a day (BID) | ORAL | Status: DC
  Administered 2011-12-14 – 2011-12-16 (×3): 12.5 mg via ORAL
  Filled 2011-12-14 (×7): qty 1

## 2011-12-14 MED ORDER — SODIUM CHLORIDE 0.9 % IV SOLN
1.0000 mL/kg/h | INTRAVENOUS | Status: DC
  Administered 2011-12-15: 1 mL/kg/h via INTRAVENOUS

## 2011-12-14 MED ORDER — HEPARIN (PORCINE) IN NACL 100-0.45 UNIT/ML-% IJ SOLN
1200.0000 [IU]/h | INTRAMUSCULAR | Status: DC
  Administered 2011-12-14 – 2011-12-15 (×2): 1200 [IU]/h via INTRAVENOUS
  Filled 2011-12-14 (×3): qty 250

## 2011-12-14 MED ORDER — INSULIN ASPART 100 UNIT/ML ~~LOC~~ SOLN: 0.0000 [IU] | Freq: Three times a day (TID) | SUBCUTANEOUS | Status: DC

## 2011-12-14 MED ORDER — GI COCKTAIL ~~LOC~~
30.0000 mL | Freq: Two times a day (BID) | ORAL | Status: DC | PRN
  Administered 2011-12-15: 30 mL via ORAL
  Filled 2011-12-14 (×2): qty 30

## 2011-12-14 MED ORDER — ISOSORBIDE MONONITRATE ER 60 MG PO TB24
60.0000 mg | ORAL_TABLET | Freq: Every day | ORAL | Status: DC
  Administered 2011-12-16: 60 mg via ORAL
  Filled 2011-12-14 (×4): qty 1

## 2011-12-14 MED ORDER — ASPIRIN EC 325 MG PO TBEC: 325.0000 mg | DELAYED_RELEASE_TABLET | Freq: Every day | ORAL | Status: DC

## 2011-12-14 MED ORDER — SODIUM CHLORIDE 0.9 % IV SOLN: 250.0000 mL | INTRAVENOUS | Status: DC | PRN

## 2011-12-14 MED ORDER — SODIUM CHLORIDE 0.9 % IJ SOLN: 3.0000 mL | INTRAMUSCULAR | Status: DC | PRN

## 2011-12-14 MED ORDER — SODIUM CHLORIDE 0.9 % IJ SOLN: 3.0000 mL | Freq: Two times a day (BID) | INTRAMUSCULAR | Status: DC

## 2011-12-14 MED ORDER — ASPIRIN 81 MG PO CHEW
324.0000 mg | CHEWABLE_TABLET | ORAL | Status: AC
  Administered 2011-12-15: 324 mg via ORAL
  Filled 2011-12-14: qty 1

## 2011-12-14 MED ORDER — HEPARIN BOLUS VIA INFUSION
4000.0000 [IU] | Freq: Once | INTRAVENOUS | Status: AC
  Administered 2011-12-14: 4000 [IU] via INTRAVENOUS

## 2011-12-14 MED ORDER — ASPIRIN EC 325 MG PO TBEC
325.0000 mg | DELAYED_RELEASE_TABLET | Freq: Every day | ORAL | Status: DC
  Administered 2011-12-14: 325 mg via ORAL
  Filled 2011-12-14: qty 1

## 2011-12-14 NOTE — ED Notes (Signed)
Pt. To be admitted per Consult Cardiology

## 2011-12-14 NOTE — ED Notes (Signed)
Magda Paganini, verified heparin rate and bolus dose.

## 2011-12-14 NOTE — ED Notes (Signed)
Bloodwork drawn off of IV- given to lab.

## 2011-12-14 NOTE — ED Provider Notes (Signed)
History     CSN: [id number]  Arrival date & time 12/14/11  5621   First MD Initiated Contact with Patient 12/14/11 1023      Chief Complaint  Patient presents with  . Chest Pain    (Consider location/radiation/quality/duration/timing/severity/associated sxs/prior treatment) HPI Complains of intermittent chest pain onset 4 days ago, pain last 5 minutes at a time treated with sublingual nitroglycerin sometimes with relief and sometimes not pain is substernal nonradiating feels like prior "heart pain" not associated symptoms no shortness of breath no nausea no sweatiness. Treated himself with aspirin 325 mg earlier today. Presently asymptomatic Past Medical History  Diagnosis Date  . Coronary artery disease   . Hypertension   . Diabetes mellitus   . Hypercholesterolemia   . Angina     Past Surgical History  Procedure Date  . Cardiac catheterization     with stent placement  . Coronary angioplasty   . Coronary angioplasty with stent placement 2010 and 2011    x 2 cardaic stents    Family History  Problem Relation Age of Onset  . Coronary artery disease Other   . Leukemia Mother   . Prostate cancer Father     History  Substance Use Topics  . Smoking status: Former Smoker    Quit date: 04/07/2009  . Smokeless tobacco: Not on file  . Alcohol Use: No      Review of Systems  Constitutional: Negative.   HENT: Negative.   Respiratory: Negative.   Cardiovascular: Positive for chest pain.  Gastrointestinal: Negative.   Musculoskeletal: Negative.   Skin: Negative.   Neurological: Negative.   Hematological: Negative.   Psychiatric/Behavioral: Negative.     Allergies  Bee venom and Shellfish allergy  Home Medications   Current Outpatient Rx  Name Route Sig Dispense Refill  . ASPIRIN 325 MG PO TBEC Oral Take 325 mg by mouth daily.      Marland Kitchen CARVEDILOL 12.5 MG PO TABS Oral Take 12.5 mg by mouth 2 (two) times daily with a meal.     . CLOPIDOGREL BISULFATE 75 MG PO  TABS Oral Take 75 mg by mouth daily.      . ISOSORBIDE MONONITRATE ER 60 MG PO TB24 Oral Take 1 tablet (60 mg total) by mouth daily. 30 tablet 5  . LISINOPRIL 5 MG PO TABS Oral Take 1 tablet (5 mg total) by mouth daily. 30 tablet 5  . METFORMIN HCL ER (OSM) 1000 MG PO TB24 Oral Take 1 tablet (1,000 mg total) by mouth 2 (two) times daily with a meal. 1 tablet 1    Resume on 11/15  . NITROGLYCERIN 0.4 MG SL SUBL Sublingual Place 1 tablet (0.4 mg total) under the tongue every 5 (five) minutes as needed for chest pain. 25 tablet 2  . TRAMADOL HCL 50 MG PO TABS Oral Take 50 mg by mouth every 6 (six) hours as needed. pain      BP 135/95  Pulse 60  Temp 98.6 F (37 C) (Oral)  Resp 14  SpO2 99%  Physical Exam  Nursing note and vitals reviewed. Constitutional: He appears well-developed and well-nourished.  HENT:  Head: Normocephalic and atraumatic.  Eyes: Conjunctivae are normal. Pupils are equal, round, and reactive to light.  Neck: Neck supple. No tracheal deviation present. No thyromegaly present.  Cardiovascular: Normal rate and regular rhythm.   No murmur heard. Pulmonary/Chest: Effort normal and breath sounds normal.  Abdominal: Soft. Bowel sounds are normal. He exhibits no distension. There is no  tenderness.  Musculoskeletal: Normal range of motion. He exhibits no edema and no tenderness.  Neurological: He is alert. Coordination normal.  Skin: Skin is warm and dry. No rash noted.  Psychiatric: He has a normal mood and affect.   Date: 12/14/2011  Rate: 60  Rhythm: normal sinus rhythm  QRS Axis: normal  Intervals: normal  ST/T Wave abnormalities: normal  Conduction Disutrbances:Rightward IVCD  Narrative Interpretation:   Old EKG Reviewed: unchanged Unchanged from 05/09/2011   ED Course  Procedures (including critical care time)  Labs Reviewed - No data to display No results found.   No diagnosis found. Results for orders placed during the hospital encounter of 12/14/11    CBC      Component Value Range   WBC 4.9  4.0 - 10.5 K/uL   RBC 5.24  4.22 - 5.81 MIL/uL   Hemoglobin 14.7  13.0 - 17.0 g/dL   HCT 40.9  81.1 - 91.4 %   MCV 82.4  78.0 - 100.0 fL   MCH 28.1  26.0 - 34.0 pg   MCHC 34.0  30.0 - 36.0 g/dL   RDW 78.2  95.6 - 21.3 %   Platelets 228  150 - 400 K/uL  DIFFERENTIAL      Component Value Range   Neutrophils Relative 37 (*) 43 - 77 %   Neutro Abs 1.8  1.7 - 7.7 K/uL   Lymphocytes Relative 53 (*) 12 - 46 %   Lymphs Abs 2.6  0.7 - 4.0 K/uL   Monocytes Relative 8  3 - 12 %   Monocytes Absolute 0.4  0.1 - 1.0 K/uL   Eosinophils Relative 2  0 - 5 %   Eosinophils Absolute 0.1  0.0 - 0.7 K/uL   Basophils Relative 0  0 - 1 %   Basophils Absolute 0.0  0.0 - 0.1 K/uL  POCT I-STAT, CHEM 8      Component Value Range   Sodium 139  135 - 145 mEq/L   Potassium 4.5  3.5 - 5.1 mEq/L   Chloride 102  96 - 112 mEq/L   BUN 14  6 - 23 mg/dL   Creatinine, Ser 0.86  0.50 - 1.35 mg/dL   Glucose, Bld 84  70 - 99 mg/dL   Calcium, Ion 5.78  4.69 - 1.32 mmol/L   TCO2 25  0 - 100 mmol/L   Hemoglobin 15.6  13.0 - 17.0 g/dL   HCT 62.9  52.8 - 41.3 %  POCT I-STAT TROPONIN I      Component Value Range   Troponin i, poc 0.00  0.00 - 0.08 ng/mL   Comment 3           APTT      Component Value Range   aPTT 34  24 - 37 seconds  PROTIME-INR      Component Value Range   Prothrombin Time 13.6  11.6 - 15.2 seconds   INR 1.02  0.00 - 1.49  CARDIAC PANEL(CRET KIN+CKTOT+MB+TROPI)      Component Value Range   Total CK 385 (*) 7 - 232 U/L   CK, MB 5.6 (*) 0.3 - 4.0 ng/mL   Troponin I <0.30  <0.30 ng/mL   Relative Index 1.5  0.0 - 2.5  PLATELET INHIBITION P2Y12      Component Value Range   Platelet Function  P2Y12 104 (*) 194 - 418 PRU  GLUCOSE, CAPILLARY      Component Value Range   Glucose-Capillary 110 (*) 70 -  99 mg/dL  GLUCOSE, CAPILLARY      Component Value Range   Glucose-Capillary 105 (*) 70 - 99 mg/dL   Comment 1 Notify RN     Dg Chest Portable 1  View  12/14/2011  *RADIOLOGY REPORT*  Clinical Data: Chest pain.  PORTABLE CHEST - 1 VIEW  Comparison: PA and lateral chest 05/09/2011.  Findings: Lungs are clear.  No pneumothorax or pleural fluid. Heart size normal.  IMPRESSION: Negative chest.  Original Report Authenticated By: Bernadene Bell. D'ALESSIO, M.D.      MDM  Suspect unstable angina based on history of coronary disease and atypical symptoms Spoke with [facility] who will arrange for admission Diagnosis unstable angina        Doug Sou, MD 12/14/11 1721

## 2011-12-14 NOTE — ED Notes (Signed)
MD at bedside. 

## 2011-12-14 NOTE — H&P (Signed)
[name] is an 51 y.o. male.   Chief Complaint: CP HPI:   Patient is a 51 year old African American male with history of coronary artery disease with multiple non-ST elevation MIs, coronary stents restenosis. History also includes tobacco abuse(quit two years ago), diabetes, HLD, cocaine use, persistently elevated CK despite discontinuing a number of his medications.  His last heart catheterization was November 2012 at which time a preserved global left ventricular function very mild mid posterior lateral hypocontractility. 40% mid left anterior descending stenosis. No significant restenosis and previous placed tandem overlapping stents in the circumflex vessel smooth 30-40% narrowing just proximal to the stented segment in the right proximal circumflex system. A patent mid RCA stent with a smooth 20% narrowing the distal right coronary artery.  He is not on a statin secondary to myalgia.   Patient presents today with 4-5 days of chest pain he reports that it comes and goes it is not particularly worse with exertion. He describes the pain as a "pressure/burning" and at its worst was 8 out 10 in intensity. Currently in the emergency room he is pain free. He does state that these episodes become more frequent. He has tried nitroglycerin sublingual on numerous occasions in the last 4-5 days with very little if any relief.  He does ride his bike to 5 miles daily without symptoms.  He does describe some tingling feeling in his hands occasionally but no radiation of pain to his arm, neck jaw, or back.  Past Medical History  Diagnosis Date  . Coronary artery disease   . Hypertension   . Diabetes mellitus   . Hypercholesterolemia   . Angina     Past Surgical History  Procedure Date  . Cardiac catheterization     with stent placement  . Coronary angioplasty   . Coronary angioplasty with stent placement 2010 and 2011    x 2 cardaic stents    Family History  Problem Relation Age of Onset  .  Coronary artery disease Other   . Leukemia Mother   . Prostate cancer Father    Social History:  reports that he quit smoking about 2 years ago. He does not have any smokeless tobacco history on file. He reports that he does not drink alcohol or use illicit drugs.  Allergies:  Allergies  Allergen Reactions  . Bee Venom Anaphylaxis and Hives  . Shellfish Allergy Anaphylaxis and Hives     (Not in a hospital admission)  Results for orders placed during the hospital encounter of 12/14/11 (from the past 48 hour(s))  CBC     Status: Normal   Collection Time   12/14/11 10:49 AM      Component Value Range Comment   WBC 4.9  4.0 - 10.5 K/uL    RBC 5.24  4.22 - 5.81 MIL/uL    Hemoglobin 14.7  13.0 - 17.0 g/dL    HCT 16.1  09.6 - 04.5 %    MCV 82.4  78.0 - 100.0 fL    MCH 28.1  26.0 - 34.0 pg    MCHC 34.0  30.0 - 36.0 g/dL    RDW 40.9  81.1 - 91.4 %    Platelets 228  150 - 400 K/uL   DIFFERENTIAL     Status: Abnormal   Collection Time   12/14/11 10:49 AM      Component Value Range Comment   Neutrophils Relative 37 (*) 43 - 77 %    Neutro Abs 1.8  1.7 -  7.7 K/uL    Lymphocytes Relative 53 (*) 12 - 46 %    Lymphs Abs 2.6  0.7 - 4.0 K/uL    Monocytes Relative 8  3 - 12 %    Monocytes Absolute 0.4  0.1 - 1.0 K/uL    Eosinophils Relative 2  0 - 5 %    Eosinophils Absolute 0.1  0.0 - 0.7 K/uL    Basophils Relative 0  0 - 1 %    Basophils Absolute 0.0  0.0 - 0.1 K/uL   POCT I-STAT TROPONIN I     Status: Normal   Collection Time   12/14/11 11:07 AM      Component Value Range Comment   Troponin i, poc 0.00  0.00 - 0.08 ng/mL    Comment 3            POCT I-STAT, CHEM 8     Status: Normal   Collection Time   12/14/11 11:09 AM      Component Value Range Comment   Sodium 139  135 - 145 mEq/L    Potassium 4.5  3.5 - 5.1 mEq/L    Chloride 102  96 - 112 mEq/L    BUN 14  6 - 23 mg/dL    Creatinine, Ser 4.78  0.50 - 1.35 mg/dL    Glucose, Bld 84  70 - 99 mg/dL    Calcium, Ion 2.95  6.21 -  1.32 mmol/L    TCO2 25  0 - 100 mmol/L    Hemoglobin 15.6  13.0 - 17.0 g/dL    HCT 30.8  65.7 - 84.6 %    Dg Chest Portable 1 View  12/14/2011  *RADIOLOGY REPORT*  Clinical Data: Chest pain.  PORTABLE CHEST - 1 VIEW  Comparison: PA and lateral chest 05/09/2011.  Findings: Lungs are clear.  No pneumothorax or pleural fluid. Heart size normal.  IMPRESSION: Negative chest.  Original Report Authenticated By: Bernadene Bell. Maricela Curet, M.D.    Review of Systems  Constitutional: Negative for fever and diaphoresis.  HENT: Negative for congestion, sore throat and neck pain.   Eyes: Negative for blurred vision.  Respiratory: Negative for cough and shortness of breath.   Cardiovascular: Positive for chest pain (resolved). Negative for palpitations, orthopnea, claudication, leg swelling and PND.  Gastrointestinal: Negative for nausea, vomiting, abdominal pain, diarrhea, constipation, blood in stool and melena.  Genitourinary: Negative for dysuria and hematuria.  Neurological: Negative for dizziness and headaches.    Blood pressure 123/90, pulse 50, temperature 98.1 F (36.7 C), temperature source Oral, resp. rate 10, SpO2 98.00%. Physical Exam  Constitutional: He is oriented to person, place, and time. He appears well-developed and well-nourished. No distress.  HENT:  Head: Normocephalic and atraumatic.  Mouth/Throat: Oropharynx is clear and moist. No oropharyngeal exudate.  Eyes: EOM are normal. Pupils are equal, round, and reactive to light. No scleral icterus.  Neck: Normal range of motion. Neck supple. No JVD present.  Cardiovascular: Normal rate and regular rhythm.  Exam reveals no friction rub.   No murmur heard. Respiratory: Effort normal and breath sounds normal. He has no wheezes. He has no rales.  GI: Soft. Bowel sounds are normal. He exhibits no distension. There is no tenderness.  Musculoskeletal: He exhibits no edema.  Lymphadenopathy:    He has no cervical adenopathy.  Neurological:  He is alert and oriented to person, place, and time. He exhibits normal muscle tone.  Skin: Skin is warm and dry.  Psychiatric: He has a normal  mood and affect.     Assessment/Plan Patient Active Hospital Problem List: Atypical chest pain (12/14/2011) DM (07/10/2010) CAD - multiple PCI to LCx (DES & Cutting Balloon PTCA), also stent to RCA (07/10/2010) Hypertension (05/09/2011) Hyperlipidemia (05/09/2011) Allergy to contrast media (used for diagnostic x-rays) (05/12/2011)  Plan:  The patient has been having atypical/nonexertional CP for 4-5 days, which is not responsive to nitro and not present or worse with exertion.  EKG is NSR with no acute changes.  Troponin POC was 0.00.  BP and HR are stable.  He does report being off his usual diet and eating a lot more sweets lately.  I think his CP is related to acid reflux.  Recommend discharge home on omeprazole/protonix.  I also discussed healthy eating and provided on-line resources to provide ideas and assist in making better choices.  Wilburt Finlay 12/14/2011, 12:14 PM    Agree with note written by [facility]  The patient is a 51 year old African American male with history of CAD stent status post stenting at [facility] in [place] approximately 4 years ago. He has significant and multiple coronary risk factors. He's had multiple procedures over the last 4 years with 3 intervention. He also has GERD. His last cath was in November. He is a very active gentleman and bicycles a daily basis. He has had 4 days of chest pain and was awakened with chest pain last night with variable response to supplement with him. He came to the medicine emergency room. He is currently pain free. EKG shows no acute changes. Enzymes are negative. His exam is benign. Given the fact that his symptoms are similar to his prior cardiac symptoms are going to admit him, rule him out, placed on IV heparin nitroglycerin recommend diagnostic coronary arteriography  tomorrow. Patient is agreeable with this approach.  Runell Gess 12/14/2011 4:20 PM

## 2011-12-14 NOTE — Progress Notes (Signed)
ANTICOAGULATION CONSULT NOTE - Initial Consult  Pharmacy Consult for Heparin Indication: chest pain/ACS/STEMI  Allergies  Allergen Reactions  . Bee Venom Anaphylaxis and Hives  . Shellfish Allergy Anaphylaxis and Hives    Patient Measurements: Height: 5\' 8"  (172.7 cm) (verbal per patient) Weight: 195 lb (88.451 kg) (verbal per patient) IBW/kg (Calculated) : 68.4  Heparin Dosing Weight: 86.4 kg  Vital Signs: Temp: 98.1 F (36.7 C) (06/17 1150) Temp src: Oral (06/17 1150) BP: 124/83 mmHg (06/17 1200) Pulse Rate: 68  (06/17 1200)  Labs:  Basename 12/14/11 1109 12/14/11 1049  HGB 15.6 14.7  HCT 46.0 43.2  PLT -- 228  APTT -- --  LABPROT -- --  INR -- --  HEPARINUNFRC -- --  CREATININE 1.00 --  CKTOTAL -- --  CKMB -- --  TROPONINI -- --    Estimated Creatinine Clearance: 95.5 ml/min (by C-G formula based on Cr of 1).   Medical History: Past Medical History  Diagnosis Date  . Coronary artery disease   . Hypertension   . Diabetes mellitus   . Hypercholesterolemia   . Angina     Medications: ASA 325mg , coreg, plavix, imdur, lisinopril, metformin, NTG SL, and tramadol.  Assessment: 51 y.o [place] American male admitted with chief complaint of chest pain.  H/o CAD/angina, multiple PCI to LCx (DES & Cutting Balloon PTCA), also stent to RCA (07/10/2010) h/o coronary stents restenosis. Pharmacy consulted to start Heparin IV infusion for ACS/STEMI. No baseline PTT, PT/INR (have ordered).  CBC done, ok; PLTC 228K   Goal of Therapy:  Heparin level 0.3-0.7 units/ml "Monitor platelets by anticoagulation protocol: Yes"    Plan:  Give 4000 units IV Heparin bolus x1  Start IV heparin drip at 1200 units/hr Check antiXa level 6 hours after bolus then daily antiXa level and CBC while on IV heparin.   Arman Filter 12/14/2011,1:33 PM

## 2011-12-14 NOTE — Progress Notes (Signed)
ANTICOAGULATION CONSULT NOTE - Initial Consult  Pharmacy Consult for Heparin Indication: chest pain/ACS/STEMI  Allergies  Allergen Reactions  . Bee Venom Anaphylaxis and Hives  . Shellfish Allergy Anaphylaxis and Hives    Patient Measurements: Height: 5\' 8"  (172.7 cm) (verbal per patient) Weight: 195 lb (88.451 kg) (verbal per patient) IBW/kg (Calculated) : 68.4  Heparin Dosing Weight: 86.4 kg  Vital Signs: Temp: 97.5 F (36.4 C) (06/17 1525) Temp src: Oral (06/17 1500) BP: 122/78 mmHg (06/17 1659) Pulse Rate: 78  (06/17 1659)  Labs:  Basename 12/14/11 2034 12/14/11 1532 12/14/11 1338 12/14/11 1109 12/14/11 1049  HGB -- -- -- 15.6 14.7  HCT -- -- -- 46.0 43.2  PLT -- -- -- -- 228  APTT -- -- 34 -- --  LABPROT -- -- 13.6 -- --  INR -- -- 1.02 -- --  HEPARINUNFRC 0.48 -- -- -- --  CREATININE -- -- -- 1.00 --  CKTOTAL 331* 385* -- -- --  CKMB 5.2* 5.6* -- -- --  TROPONINI <0.30 <0.30 -- -- --    Estimated Creatinine Clearance: 95.5 ml/min (by C-G formula based on Cr of 1).   Medical History: Past Medical History  Diagnosis Date  . Coronary artery disease   . Hypertension   . Diabetes mellitus   . Hypercholesterolemia   . Angina     Medications: ASA 325mg , coreg, plavix, imdur, lisinopril, metformin, NTG SL, and tramadol.  Assessment: 51 y.o [place] American male admitted with chief complaint of chest pain on heparin infusion, heparin level (0.48) therapeutic, CK, MB slightly elevates, but troponin remains negative. No bleeding noted per chart.  Goal of Therapy:  Heparin level 0.3-0.7 units/ml "Monitor platelets by anticoagulation protocol: Yes"    Plan:  Continue heparin infusion at 1200 units/hr F/u Am CBC and heparin level  Bayard Hugger, PharmD, BCPS  Clinical Pharmacist  Pager: [phone]  12/14/2011,9:39 PM

## 2011-12-14 NOTE — ED Notes (Signed)
Pt. Stated, i started having Chest pain off and on for 4 days.  It woke me up in the middle of the night

## 2011-12-15 ENCOUNTER — Encounter (HOSPITAL_COMMUNITY): Payer: Self-pay | Admitting: Cardiology

## 2011-12-15 ENCOUNTER — Encounter (HOSPITAL_COMMUNITY)
Admission: EM | Disposition: A | Discharge status: Home / Self Care | Payer: Self-pay | Source: Home / Self Care | Attending: Cardiovascular Disease

## 2011-12-15 DIAGNOSIS — Z95820 Peripheral vascular angioplasty status with implants and grafts: Secondary | ICD-10-CM

## 2011-12-15 HISTORY — DX: Peripheral vascular angioplasty status with implants and grafts: Z95.820

## 2011-12-15 HISTORY — PX: LEFT HEART CATHETERIZATION WITH CORONARY ANGIOGRAM: SHX5451

## 2011-12-15 HISTORY — PX: CARDIAC CATHETERIZATION: SHX172

## 2011-12-15 LAB — GLUCOSE, CAPILLARY: Glucose-Capillary: 114 mg/dL — ABNORMAL HIGH (ref 70–99)

## 2011-12-15 LAB — LIPID PANEL
HDL: 46 mg/dL (ref >39)
LDL Cholesterol: 134 mg/dL — ABNORMAL HIGH (ref 0–99)
Total CHOL/HDL Ratio: 4.5 RATIO
Triglycerides: 130 mg/dL (ref <150)
VLDL: 26 mg/dL (ref 0–40)

## 2011-12-15 LAB — BASIC METABOLIC PANEL
BUN: 14 mg/dL (ref 6–23)
Calcium: 9.3 mg/dL (ref 8.4–10.5)
Creatinine, Ser: 0.82 mg/dL (ref 0.50–1.35)
GFR calc Af Amer: >90 mL/min (ref >90)
GFR calc non Af Amer: >90 mL/min (ref >90)

## 2011-12-15 LAB — CBC
HCT: 44.7 % (ref 39.0–52.0)
MCHC: 33.6 g/dL (ref 30.0–36.0)
MCV: 82.6 fL (ref 78.0–100.0)
Platelets: 216 10*3/uL (ref 150–400)
RDW: 12.9 % (ref 11.5–15.5)

## 2011-12-15 LAB — CARDIAC PANEL(CRET KIN+CKTOT+MB+TROPI): Troponin I: <0.3 ng/mL (ref <0.30)

## 2011-12-15 SURGERY — LEFT HEART CATHETERIZATION WITH CORONARY ANGIOGRAM
Anesthesia: LOCAL

## 2011-12-15 MED ORDER — BIVALIRUDIN 250 MG IV SOLR
INTRAVENOUS | Status: AC
  Filled 2011-12-15: qty 250

## 2011-12-15 MED ORDER — CLOPIDOGREL BISULFATE 75 MG PO TABS
75.0000 mg | ORAL_TABLET | Freq: Every day | ORAL | Status: DC
  Administered 2011-12-16: 75 mg via ORAL
  Filled 2011-12-15: qty 1

## 2011-12-15 MED ORDER — HEPARIN (PORCINE) IN NACL 2-0.9 UNIT/ML-% IJ SOLN
INTRAMUSCULAR | Status: AC
  Filled 2011-12-15: qty 2000

## 2011-12-15 MED ORDER — INSULIN ASPART 100 UNIT/ML ~~LOC~~ SOLN: 0.0000 [IU] | Freq: Every day | SUBCUTANEOUS | Status: DC

## 2011-12-15 MED ORDER — MORPHINE SULFATE 2 MG/ML IJ SOLN: 1.0000 mg | INTRAMUSCULAR | Status: DC | PRN

## 2011-12-15 MED ORDER — DIPHENHYDRAMINE HCL 50 MG/ML IJ SOLN
INTRAMUSCULAR | Status: AC
  Filled 2011-12-15: qty 1

## 2011-12-15 MED ORDER — LIDOCAINE HCL (PF) 1 % IJ SOLN
INTRAMUSCULAR | Status: AC
  Filled 2011-12-15: qty 30

## 2011-12-15 MED ORDER — NITROGLYCERIN 0.2 MG/ML ON CALL CATH LAB
INTRAVENOUS | Status: AC
  Filled 2011-12-15: qty 1

## 2011-12-15 MED ORDER — INSULIN ASPART 100 UNIT/ML ~~LOC~~ SOLN
0.0000 [IU] | Freq: Three times a day (TID) | SUBCUTANEOUS | Status: DC
  Administered 2011-12-16: 3 [IU] via SUBCUTANEOUS

## 2011-12-15 MED ORDER — ASPIRIN EC 325 MG PO TBEC
325.0000 mg | DELAYED_RELEASE_TABLET | Freq: Every day | ORAL | Status: DC
  Administered 2011-12-16: 325 mg via ORAL
  Filled 2011-12-15: qty 1

## 2011-12-15 MED ORDER — FAMOTIDINE IN NACL 20-0.9 MG/50ML-% IV SOLN
20.0000 mg | Freq: Once | INTRAVENOUS | Status: DC
  Filled 2011-12-15: qty 50

## 2011-12-15 MED ORDER — MIDAZOLAM HCL 2 MG/2ML IJ SOLN
INTRAMUSCULAR | Status: AC
  Filled 2011-12-15: qty 2

## 2011-12-15 MED ORDER — INSULIN ASPART 100 UNIT/ML ~~LOC~~ SOLN
0.0000 [IU] | SUBCUTANEOUS | Status: DC
Start: 2011-12-15 — End: 2011-12-15

## 2011-12-15 MED ORDER — METHYLPREDNISOLONE SODIUM SUCC 125 MG IJ SOLR
125.0000 mg | Freq: Once | INTRAMUSCULAR | Status: DC
  Filled 2011-12-15: qty 2

## 2011-12-15 MED ORDER — ACETAMINOPHEN 325 MG PO TABS: 650.0000 mg | ORAL_TABLET | ORAL | Status: DC | PRN

## 2011-12-15 MED ORDER — ONDANSETRON HCL 4 MG/2ML IJ SOLN: 4.0000 mg | Freq: Four times a day (QID) | INTRAMUSCULAR | Status: DC | PRN

## 2011-12-15 MED ORDER — ASPIRIN EC 325 MG PO TBEC: 325.0000 mg | DELAYED_RELEASE_TABLET | Freq: Every day | ORAL | Status: DC

## 2011-12-15 MED ORDER — CLOPIDOGREL BISULFATE 300 MG PO TABS
ORAL_TABLET | ORAL | Status: AC
  Filled 2011-12-15: qty 1

## 2011-12-15 MED ORDER — SODIUM CHLORIDE 0.9 % IV SOLN
INTRAVENOUS | Status: AC
  Administered 2011-12-15: 14:00:00 via INTRAVENOUS

## 2011-12-15 MED ORDER — FENTANYL CITRATE 0.05 MG/ML IJ SOLN
INTRAMUSCULAR | Status: AC
  Filled 2011-12-15: qty 2

## 2011-12-15 MED ORDER — DIPHENHYDRAMINE HCL 50 MG/ML IJ SOLN: 25.0000 mg | Freq: Once | INTRAMUSCULAR | Status: DC

## 2011-12-15 NOTE — Op Note (Signed)
[name] is a 51 y.o. male    [id number] LOCATION:  FACILITY: [facility]  PHYSICIAN: Nanetta Batty, M.D. Apr 18, 1961   DATE OF PROCEDURE:  12/15/2011  DATE OF DISCHARGE:  [facility]  CARDIAC CATHETERIZATION     History obtained from chart review. Patient is a 51 year old African American male with history of coronary artery disease with multiple non-ST elevation MIs, coronary stents restenosis. History also includes tobacco abuse(quit two years ago), diabetes, HLD, cocaine use, persistently elevated CK despite discontinuing a number of his medications. His last heart catheterization was November 2012 at which time a preserved global left ventricular function very mild mid posterior lateral hypocontractility. 40% mid left anterior descending stenosis. No significant restenosis and previous placed tandem overlapping stents in the circumflex vessel smooth 30-40% narrowing just proximal to the stented segment in the right proximal circumflex system. A patent mid RCA stent with a smooth 20% narrowing the distal right coronary artery. He is not on a statin secondary to myalgia.  Patient presents today with 4-5 days of chest pain he reports that it comes and goes it is not particularly worse with exertion. He describes the pain as a "pressure/burning" and at its worst was 8 out 10 in intensity. Currently in the emergency room he is pain free. He does state that these episodes become more frequent. He has tried nitroglycerin sublingual on numerous occasions in the last 4-5 days with very little if any relief. He does ride his bike to 5 miles daily without symptoms. He does describe some tingling feeling in his hands occasionally but no radiation of pain to his arm, neck jaw, or back.    PROCEDURE DESCRIPTION:    The patient was brought to the second floor  [facility] lab in the postabsorptive state. He was  premedicated with Valium 5 mg by mouth, IV Versed and  fentanyl.Marland Kitchen His right groin was prepped and shaved in usual sterile fashion. Xylocaine 1% was used  for local anesthesia. A 5 French sheath was inserted into the right common femoral  artery using standard Seldinger technique. 5 French right and left Judkins diagnostic catheters a 5 French pigtail catheter was used for selective coronary angiography and left ventriculography respectively. Visipaque was used the entirety of the entirety of the case. Retrograde aortic,ventricular and pullback pressures were recorded.  HEMODYNAMICS:    AO SYSTOLIC/AO DIASTOLIC: 108/76   LV SYSTOLIC/LV DIASTOLIC: 110/8  ANGIOGRAPHIC RESULTS:   1. Left main; normal  2. LAD; minor irregularities 3. Left circumflex; 75% segmental proximal just proximal to the previously placed stents.75 Percent in-stent restenosis" within the mid circumflex proximal.  4. Right coronary artery; patent stent in the midportion. 200 mcg of nitroglycerin was administered because of what appeared to be spasm on the proximal and distal portion of the vessel which improved angiographically 5. Left ventriculography; RAO left ventriculogram was performed using  25 mL of Visipaque dye at 12 mL/second. The overall LVEF estimated  60 %  Without wall motion abnormalities  IMPRESSION:Mr. Has progression of disease in his proximal circumflex and "in-stent restenosis within the mid AV groove circumflex. Will proceed with PCI and stenting using resolute drug-eluting stents, aspirin, Plavix, and Angiomax.  Procedure description: The existing 5 [place] was exchanged to a 6 [place] sheath. The patient was only on aspirin and Plavix. He received an additional 300mg  of Plavix. Angiomax bolus was administered with an ACT of 409. Total contrast administered the patient was 120 cc   A  6 [place] XP 3.5 cm guide catheter 0.014/190 cm length [employer] software at 2.0 mm by 12 mm all emerged balloon predilatation was performed. Was performed of the mid AV circumflex  using a 3.0 x 15 mm long drug-eluting stent at 14 atmospheres. Post dilatation was performed with a 3.25 mm x 12 mm long [place] sprinter at 14 atmospheres. The proximal circumflex was then stented with a 3 mm x 12 mm long resolute drug-eluting stent and postdilated with the [place] sprinter balloon. Final angiographic result with reduction of a 75% proximal circumflex with 0% residual and an 80% in-stent restenosis within the mid AV groove circumflex in 0% residual. The patient tolerated the procedure well. There were no hemodynamic or electrocardiographic sequela.  Final impression: Successful PCI and stenting of proximal and mid circumflex with drug-eluting stents and antiemetics. The patient will be treated with aspirin and Plavix. She removing approximately 3 hours and pressure will be held on the groin to achieve hemostasis. The patient left the condition. He was gently hydrated overnight and discharged home in the morning.  Runell Gess MD, [facility] 12/15/2011 10:18 AM

## 2011-12-15 NOTE — Progress Notes (Signed)
Pt is having retrosternal chest pain. I Paged cardiology. Pt received GI cocktail 4 hour ago for same symptom with temporary relief.

## 2011-12-15 NOTE — Progress Notes (Signed)
Pt belongings (computer, cell phones (3), glasses, phone chargers, shorts, tennis shoes, bananas) packed into pt bookbag and taken to 2500. Medications sent and left with nurses station. Ramond Craver, RN

## 2011-12-15 NOTE — H&P (Signed)
   Pt was reexamined and existing H & P reviewed. No changes found.  Runell Gess, MD [facility] 12/15/2011 9:20 AM

## 2011-12-16 LAB — CBC
HCT: 43 % (ref 39.0–52.0)
Hemoglobin: 14.2 g/dL (ref 13.0–17.0)
MCV: 83.5 fL (ref 78.0–100.0)
RBC: 5.15 MIL/uL (ref 4.22–5.81)
RDW: 12.8 % (ref 11.5–15.5)
WBC: 12.7 10*3/uL — ABNORMAL HIGH (ref 4.0–10.5)

## 2011-12-16 LAB — BASIC METABOLIC PANEL
BUN: 13 mg/dL (ref 6–23)
Chloride: 102 mEq/L (ref 96–112)
Creatinine, Ser: 0.77 mg/dL (ref 0.50–1.35)
GFR calc Af Amer: >90 mL/min (ref >90)
Glucose, Bld: 232 mg/dL — ABNORMAL HIGH (ref 70–99)
Potassium: 4.2 mEq/L (ref 3.5–5.1)

## 2011-12-16 LAB — GLUCOSE, CAPILLARY: Glucose-Capillary: 162 mg/dL — ABNORMAL HIGH (ref 70–99)

## 2011-12-16 LAB — HEMOGLOBIN A1C
Hgb A1c MFr Bld: 6.2 % — ABNORMAL HIGH (ref <5.7)
Mean Plasma Glucose: 131 mg/dL — ABNORMAL HIGH (ref <117)

## 2011-12-16 MED ORDER — YOU HAVE A PACEMAKER BOOK
Freq: Once | Status: DC
  Filled 2011-12-16: qty 1

## 2011-12-16 MED ORDER — METFORMIN HCL ER (OSM) 1000 MG PO TB24: 1000.0000 mg | ORAL_TABLET | Freq: Two times a day (BID) | ORAL | Status: DC

## 2011-12-16 MED FILL — Dextrose Inj 5%: INTRAVENOUS | Qty: 50 | Status: AC

## 2011-12-16 NOTE — Progress Notes (Signed)
[facility]  Subjective: The cath site is a little sore.   Objective: Vital signs in last 24 hours: Temp:  [97.6 F (36.4 C)-98.8 F (37.1 C)] 98 F (36.7 C) (06/19 0546) Pulse Rate:  [49-88] 66  (06/19 0546) Resp:  [12-19] 12  (06/19 0546) BP: (117-148)/(53-109) 132/74 mmHg (06/19 0546) SpO2:  [96 %-100 %] 98 % (06/19 0546) Weight:  [92.3 kg (203 lb 7.8 oz)] 92.3 kg (203 lb 7.8 oz) (06/19 0030) Last BM Date: 12/15/11  Intake/Output from previous day: 06/18 0701 - 06/19 0700 In: 1507.5 [P.O.:1320; I.V.:187.5] Out: 1000 [Urine:1000] Intake/Output this shift:    Medications Current Facility-Administered Medications  Medication Dose Route Frequency Provider Last Rate Last Dose  . 0.9 %  sodium chloride infusion   Intravenous Continuous Runell Gess, MD 75 mL/hr at 12/15/11 1400    . aspirin EC tablet 325 mg  325 mg Oral Daily Runell Gess, MD      . bivalirudin (ANGIOMAX) 250 MG injection           . carvedilol (COREG) tablet 12.5 mg  12.5 mg Oral BID WC Wilburt Finlay, PA   12.5 mg at 12/16/11 0730  . clopidogrel (PLAVIX) 300 MG tablet           . clopidogrel (PLAVIX) tablet 75 mg  75 mg Oral Q breakfast Runell Gess, MD   75 mg at 12/16/11 0731  . diphenhydrAMINE (BENADRYL) 50 MG/ML injection           . fentaNYL (SUBLIMAZE) 0.05 MG/ML injection           . gi cocktail (Maalox,Lidocaine,Donnatal)  30 mL Oral BID PRN Wilburt Finlay, PA   30 mL at 12/15/11 0010  . heparin 2-0.9 UNIT/ML-% infusion           . insulin aspart (novoLOG) injection 0-15 Units  0-15 Units Subcutaneous TID WC Chrystie Nose, MD   3 Units at 12/16/11 0743  . insulin aspart (novoLOG) injection 0-5 Units  0-5 Units Subcutaneous QHS Chrystie Nose, MD      . isosorbide mononitrate (IMDUR) 24 hr tablet 60 mg  60 mg Oral Daily Wilburt Finlay, PA      . lidocaine (XYLOCAINE) 1 % injection           . midazolam (VERSED) 2 MG/2ML injection           . morphine 2 MG/ML  injection 1 mg  1 mg Intravenous Q1H PRN Runell Gess, MD      . nitroGLYCERIN (NITROSTAT) SL tablet 0.4 mg  0.4 mg Sublingual Q5 Min x 3 PRN Wilburt Finlay, PA      . nitroGLYCERIN (NTG ON-CALL) 0.2 mg/mL injection           . pantoprazole (PROTONIX) EC tablet 40 mg  40 mg Oral Q0600 Wilburt Finlay, PA   40 mg at 12/16/11 0729  . DISCONTD: 0.9 %  sodium chloride infusion  250 mL Intravenous PRN Wilburt Finlay, PA      . DISCONTD: 0.9 %  sodium chloride infusion  1 mL/kg/hr Intravenous Continuous Wilburt Finlay, PA 88.5 mL/hr at 12/15/11 0530 1 mL/kg/hr at 12/15/11 0530  . DISCONTD: acetaminophen (TYLENOL) tablet 650 mg  650 mg Oral Q4H PRN Wilburt Finlay, PA      . DISCONTD: acetaminophen (TYLENOL) tablet 650 mg  650 mg Oral Q4H PRN Runell Gess, MD      . DISCONTD: aspirin EC tablet  325 mg  325 mg Oral Daily Runell Gess, MD      . DISCONTD: aspirin EC tablet 325 mg  325 mg Oral Daily Runell Gess, MD      . DISCONTD: diphenhydrAMINE (BENADRYL) injection 25 mg  25 mg Intravenous Once Runell Gess, MD      . DISCONTD: famotidine (PEPCID) IVPB 20 mg  20 mg Intravenous Once Runell Gess, MD      . DISCONTD: heparin ADULT infusion 100 units/mL (25000 units/250 mL)  1,200 Units/hr Intravenous Continuous Doug Sou, MD 12 mL/hr at 12/15/11 0720 1,200 Units/hr at 12/15/11 0720  . DISCONTD: insulin aspart (novoLOG) injection 0-15 Units  0-15 Units Subcutaneous Q4H Chrystie Nose, MD      . DISCONTD: insulin aspart (novoLOG) injection 0-9 Units  0-9 Units Subcutaneous TID WC Wilburt Finlay, PA      . DISCONTD: methylPREDNISolone sodium succinate (SOLU-MEDROL) 125 mg/2 mL injection 125 mg  125 mg Intravenous Once Runell Gess, MD      . DISCONTD: ondansetron (ZOFRAN) injection 4 mg  4 mg Intravenous Q6H PRN Wilburt Finlay, PA      . DISCONTD: ondansetron (ZOFRAN) injection 4 mg  4 mg Intravenous Q6H PRN Runell Gess, MD      . DISCONTD: sodium chloride 0.9 % injection 3 mL  3 mL  Intravenous Q12H Wilburt Finlay, PA      . DISCONTD: sodium chloride 0.9 % injection 3 mL  3 mL Intravenous PRN Wilburt Finlay, PA      . DISCONTD: you have a pacemaker book   Does not apply Once Runell Gess, MD        PE: General appearance: alert, cooperative and no distress Lungs: clear to auscultation bilaterally and BS decreased  Heart: regular rate and rhythm, S1, S2 normal, no murmur, click, rub or gallop Extremities: No LEE Pulses: right radial 1+, left 2+,  Skin: Small hematoma in the right groin.  Mildly tender. No ecchymosis or erythema   Lab Results:   Basename 12/16/11 0350 12/15/11 0515 12/14/11 1109 12/14/11 1049  WBC 12.7* 4.4 -- 4.9  HGB 14.2 15.0 15.6 --  HCT 43.0 44.7 46.0 --  PLT 216 216 -- 228   BMET  Basename 12/16/11 0350 12/15/11 0515 12/14/11 1109  NA 136 138 139  K 4.2 4.4 4.5  CL 102 104 102  CO2 22 23 --  GLUCOSE 232* 136* 84  BUN 13 14 14   CREATININE 0.77 0.82 1.00  CALCIUM 9.2 9.3 --   PT/INR  Basename 12/15/11 0515 12/14/11 1338  LABPROT 13.5 13.6  INR 1.01 1.02   Cholesterol  Basename 12/15/11 0515  CHOL 206*    Studies/Results: PROCEDURE DESCRIPTION:  The patient was brought to the second floor  [facility] lab in the postabsorptive state. He was  premedicated with Valium 5 mg by mouth, IV Versed and fentanyl.Marland Kitchen His right groin  was prepped and shaved in usual sterile fashion. Xylocaine 1% was used  for local anesthesia. A 5 French sheath was inserted into the right common femoral  artery using standard Seldinger technique. 5 French right and left Judkins diagnostic catheters a 5 French pigtail catheter was used for selective coronary angiography and left ventriculography respectively. Visipaque was used the entirety of the entirety of the case. Retrograde aortic,ventricular and pullback pressures were recorded.  HEMODYNAMICS:  AO SYSTOLIC/AO DIASTOLIC: 108/76  LV SYSTOLIC/LV DIASTOLIC: 110/8  ANGIOGRAPHIC RESULTS:    1. Left main;  normal  2. LAD; minor irregularities  3. Left circumflex; 75% segmental proximal just proximal to the previously placed stents.75 Percent in-stent restenosis" within the mid circumflex proximal.  4. Right coronary artery; patent stent in the midportion. 200 mcg of nitroglycerin was administered because of what appeared to be spasm on the proximal and distal portion of the vessel which improved angiographically  5. Left ventriculography; RAO left ventriculogram was performed using  25 mL of Visipaque dye at 12 mL/second. The overall LVEF estimated  60 % Without wall motion abnormalities  IMPRESSION:Mr. Has progression of disease in his proximal circumflex and "in-stent restenosis within the mid AV groove circumflex. Will proceed with PCI and stenting using resolute drug-eluting stents, aspirin, Plavix, and Angiomax.  Procedure description: The existing 5 [place] was exchanged to a 6 [place] sheath. The patient was only on aspirin and Plavix. He received an additional 300mg  of Plavix. Angiomax bolus was administered with an ACT of 409. Total contrast administered the patient was 120 cc  A 6 [place] XP 3.5 cm guide catheter 0.014/190 cm length [employer] software at 2.0 mm by 12 mm all emerged balloon predilatation was performed. Was performed of the mid AV circumflex using a 3.0 x 15 mm long drug-eluting stent at 14 atmospheres. Post dilatation was performed with a 3.25 mm x 12 mm long [place] sprinter at 14 atmospheres. The proximal circumflex was then stented with a 3 mm x 12 mm long resolute drug-eluting stent and postdilated with the [place] sprinter balloon. Final angiographic result with reduction of a 75% proximal circumflex with 0% residual and an 80% in-stent restenosis within the mid AV groove circumflex in 0% residual. The patient tolerated the procedure well. There were no hemodynamic or electrocardiographic sequela.  Final impression: Successful PCI and stenting of proximal and mid circumflex with  drug-eluting stents and antiemetics. The patient will be treated with aspirin and Plavix. She removing approximately 3 hours and pressure will be held on the groin to achieve hemostasis. The patient left the condition. He was gently hydrated overnight and discharged home in the morning.  Runell Gess MD, [facility]  12/15/2011  10:18 AM  Assessment/Plan   Principal Problem:  *Crescendo angina Active Problems:  DM  CAD - multiple PCI to LCx (DES & Cutting Balloon PTCA), also stent to RCA  Hypertension  Hyperlipidemia  Allergy to contrast media (used for diagnostic x-rays)  S/P angioplasty with stent, DES, to proximal and mid LCX 12/15/11  Plan:   S/P angioplasty with stent, DES, to proximal and mid LCX 12/15/11.  P2Y12 appropriate for being on Plavix.  ASA/Coreg.  No statin due to myalgia but he would benefit from one HDL and TG ok.  LDL not at target.  A1C 6.1.  Diet and exercise discussed.  He was seen by cardiac rehab.  WBCs up today.  DC home,  FU with Dr. Rennis Golden.  Discuss statin at that time.   LOS: 2 days    HAGER, BRYAN 12/16/2011 8:42 AM   Agree with note written by Jones Skene PAC  S/P LCX PCI/Stent with 2 DES for progression of disease and ISR. Groin OK. Exam benign. Labs OK. D/C home on ASA/Plavix, Pravastatin and CoQ10. ROV with Dr. [facility] 2-3 weeks.  Runell Gess 12/16/2011 9:26 AM

## 2011-12-16 NOTE — Progress Notes (Signed)
CARDIAC REHAB PHASE I   PRE:  Rate/Rhythm: 80SR  BP:  Supine: 137/93  Sitting:   Standing:    SaO2:   MODE:  Ambulation: 680 ft   POST:  Rate/Rhythem: 89SR  BP:  Supine:   Sitting: 139/90  Standing:    SaO2:  0745-0840 Pt walked 680 ft on RA with steady gait. Denied chest pain. Tolerated well. To recliner after walk. Education completed. Declined CRP 2 as he just completed program recently.  Wants to continue exercising on his own.   Duanne Limerick

## 2011-12-17 NOTE — Discharge Summary (Signed)
Physician Discharge Summary  Patient ID: [name] MRN: [id number] DOB/AGE: 50-23-1962 50 y.o.  Admit date: 12/14/2011 Discharge date: 12/17/2011  Admission Diagnoses:  Discharge Diagnoses:  Principal Problem:  *Crescendo angina Active Problems:  DM  CAD - multiple PCI to LCx (DES & Cutting Balloon PTCA), also stent to RCA  Hypertension  Hyperlipidemia  Allergy to contrast media (used for diagnostic x-rays)  S/P angioplasty with stent, DES, to proximal and mid LCX 12/15/11   Discharged Condition: stable  Hospital Course:    Consults: None  Significant Diagnostic Studies:     Treatments:     Discharge Exam: Blood pressure 132/74, pulse 66, temperature 98 F (36.7 C), temperature source Oral, resp. rate 12, height 5\' 8"  (1.727 m), weight 92.3 kg (203 lb 7.8 oz), SpO2 98.00%. n  Disposition: 01-Home or Self Care      Signed: Wilburt Finlay 12/17/2011, 11:39 AM  Physician Discharge Summary  Patient ID: [name] MRN: [id number] DOB/AGE: 1961/03/01 51 y.o.  Admit date: 12/14/2011 Discharge date: 12/17/2011  Admission Diagnoses: Unstable angina  Discharge Diagnoses:  Principal Problem:  *Crescendo angina Active Problems:  DM  CAD - multiple PCI to LCx (DES & Cutting Balloon PTCA), also stent to RCA  Hypertension  Hyperlipidemia  Allergy to contrast media (used for diagnostic x-rays)  S/P angioplasty with stent, DES, to proximal and mid LCX 12/15/11   Discharged Condition: stable  Hospital Course  Patient is a 51 year old African American male with history of coronary artery disease with multiple non-ST elevation MIs, coronary stents restenosis. History also includes tobacco abuse(quit two years ago), diabetes, HLD, cocaine use, persistently elevated CK despite discontinuing a number of his medications. His last heart catheterization was November 2012 at which time a preserved global left ventricular function very mild mid posterior lateral  hypocontractility. 40% mid left anterior descending stenosis. No significant restenosis and previous placed tandem overlapping stents in the circumflex vessel smooth 30-40% narrowing just proximal to the stented segment in the right proximal circumflex system. A patent mid RCA stent with a smooth 20% narrowing the distal right coronary artery. He is not on a statin secondary to myalgia.   He presented with 4-5 days of chest pain that comes and goes it is not particularly worse with exertion. He describes the pain as a "pressure/burning" and at its worst was 8 out 10 in intensity. In the emergency room he was pain free. He did state that these episodes become more frequent. He has tried nitroglycerin sublingual on numerous occasions in the last 4-5 days with very little if any relief. He does ride his bike 2 to 5 miles daily without symptoms. He does describe some tingling feeling in his hands occasionally but no radiation of pain to his arm, neck jaw, or back.  He was admitted to telemetry and started on IV heparin.  Cardiac enzymes were cycled and troponin was negative.  He was taken to the cath lab and found to have progression in the proximal circumflex and "in-stent restenosis within the mid AV groove circumflex.  He received DES to both areas.  The procedure was completed without complications.  He has been intolerant to most statins in the past but will try pravastatin and CoQ10.  A lengthy discussion regarding diet and exercise was conducted.  He was discharged home in stable condition after being seen by Dr. Allyson Sabal.     Consults: None  Significant Diagnostic Studies:  Cardiac Panel (last 3 results)  Basename 12/15/11 0218 12/14/11  2034 12/14/11 1532  CKTOTAL 281* 331* 385*  CKMB 4.6* 5.2* 5.6*  TROPONINI <0.30 <0.30 <0.30  RELINDX 1.6 1.6 1.5    Lipid Panel     Component Value Date/Time   CHOL 206* 12/15/2011 0515   TRIG 130 12/15/2011 0515   HDL 46 12/15/2011 0515   CHOLHDL 4.5 12/15/2011  0515   VLDL 26 12/15/2011 0515   LDLCALC 134* 12/15/2011 0515   HEMODYNAMICS:  AO SYSTOLIC/AO DIASTOLIC: 108/76  LV SYSTOLIC/LV DIASTOLIC: 110/8  ANGIOGRAPHIC RESULTS:  1. Left main; normal  2. LAD; minor irregularities  3. Left circumflex; 75% segmental proximal just proximal to the previously placed stents.75 Percent in-stent restenosis" within the mid circumflex proximal.  4. Right coronary artery; patent stent in the midportion. 200 mcg of nitroglycerin was administered because of what appeared to be spasm on the proximal and distal portion of the vessel which improved angiographically  5. Left ventriculography; RAO left ventriculogram was performed using  25 mL of Visipaque dye at 12 mL/second. The overall LVEF estimated  60 % Without wall motion abnormalities  IMPRESSION:Mr. Has progression of disease in his proximal circumflex and "in-stent restenosis within the mid AV groove circumflex. Will proceed with PCI and stenting using resolute drug-eluting stents, aspirin, Plavix, and Angiomax.  Procedure description: The existing 5 [place] was exchanged to a 6 [place] sheath. The patient was only on aspirin and Plavix. He received an additional 300mg  of Plavix. Angiomax bolus was administered with an ACT of 409. Total contrast administered the patient was 120 cc  A 6 [place] XP 3.5 cm guide catheter 0.014/190 cm length [employer] software at 2.0 mm by 12 mm all emerged balloon predilatation was performed. Was performed of the mid AV circumflex using a 3.0 x 15 mm long drug-eluting stent at 14 atmospheres. Post dilatation was performed with a 3.25 mm x 12 mm long [place] sprinter at 14 atmospheres. The proximal circumflex was then stented with a 3 mm x 12 mm long resolute drug-eluting stent and postdilated with the [place] sprinter balloon. Final angiographic result with reduction of a 75% proximal circumflex with 0% residual and an 80% in-stent restenosis within the mid AV groove circumflex in 0% residual. The patient  tolerated the procedure well. There were no hemodynamic or electrocardiographic sequela.  Final impression: Successful PCI and stenting of proximal and mid circumflex with drug-eluting stents and antiemetics. The patient will be treated with aspirin and Plavix. She removing approximately 3 hours and pressure will be held on the groin to achieve hemostasis. The patient left the condition. He was gently hydrated overnight and discharged home in the morning.  Runell Gess MD, [facility]  12/15/2011  10:18 AM    Treatments: DES to Circumflex  Discharge Exam: Blood pressure 132/74, pulse 66, temperature 98 F (36.7 C), temperature source Oral, resp. rate 12, height 5\' 8"  (1.727 m), weight 92.3 kg (203 lb 7.8 oz), SpO2 98.00%.   Disposition: 01-Home or Self Care  Discharge Orders    Future Orders Please Complete By Expires   Diet - low sodium heart healthy      Increase activity slowly      Discharge instructions      Comments:   No lifting more than a gallon of milk or driving for three days.     Medication List  As of 12/17/2011 11:39 AM   TAKE these medications         aspirin 325 MG EC tablet   Take 325 mg by mouth daily.  carvedilol 12.5 MG tablet   Commonly known as: COREG   Take 12.5 mg by mouth 2 (two) times daily with a meal.      clopidogrel 75 MG tablet   Commonly known as: PLAVIX   Take 75 mg by mouth daily.      isosorbide mononitrate 60 MG 24 hr tablet   Commonly known as: IMDUR   Take 1 tablet (60 mg total) by mouth daily.      lisinopril 5 MG tablet   Commonly known as: PRINIVIL,ZESTRIL   Take 1 tablet (5 mg total) by mouth daily.      metformin 1000 MG (OSM) 24 hr tablet   Commonly known as: FORTAMET   Take 1 tablet (1,000 mg total) by mouth 2 (two) times daily with a meal.      nitroGLYCERIN 0.4 MG SL tablet   Commonly known as: NITROSTAT   Place 1 tablet (0.4 mg total) under the tongue every 5 (five) minutes as needed for chest pain.      traMADol  50 MG tablet   Commonly known as: ULTRAM   Take 50 mg by mouth every 6 (six) hours as needed. pain           Follow-up Information    Follow up with Chrystie Nose, MD on 01/01/2012. ( 4 PM)    Contact information:   [address] [place] [address] [phone]          Signed: Wilburt Finlay 12/17/2011, 11:39 AM

## 2012-01-08 ENCOUNTER — Encounter (INDEPENDENT_AMBULATORY_CARE_PROVIDER_SITE_OTHER): Payer: Self-pay | Admitting: General Surgery

## 2012-02-28 HISTORY — PX: NM MYOCAR PERF WALL MOTION: HXRAD629

## 2012-03-10 ENCOUNTER — Inpatient Hospital Stay (HOSPITAL_COMMUNITY)
Admission: EM | Admit: 2012-03-10 | Discharge: 2012-03-10 | DRG: 313 | Disposition: A | Discharge status: Home / Self Care | Payer: Medicaid Other | Attending: Emergency Medicine | Admitting: Emergency Medicine

## 2012-03-10 ENCOUNTER — Emergency Department (HOSPITAL_COMMUNITY): Payer: Medicaid Other

## 2012-03-10 ENCOUNTER — Encounter (HOSPITAL_COMMUNITY): Payer: Self-pay | Admitting: *Deleted

## 2012-03-10 DIAGNOSIS — E7800 Pure hypercholesterolemia, unspecified: Secondary | ICD-10-CM | POA: Diagnosis present

## 2012-03-10 DIAGNOSIS — Z91041 Radiographic dye allergy status: Secondary | ICD-10-CM | POA: Diagnosis present

## 2012-03-10 DIAGNOSIS — K219 Gastro-esophageal reflux disease without esophagitis: Secondary | ICD-10-CM | POA: Diagnosis present

## 2012-03-10 DIAGNOSIS — Z9861 Coronary angioplasty status: Secondary | ICD-10-CM

## 2012-03-10 DIAGNOSIS — I10 Essential (primary) hypertension: Secondary | ICD-10-CM | POA: Diagnosis present

## 2012-03-10 DIAGNOSIS — I2510 Atherosclerotic heart disease of native coronary artery without angina pectoris: Secondary | ICD-10-CM | POA: Diagnosis present

## 2012-03-10 DIAGNOSIS — E119 Type 2 diabetes mellitus without complications: Secondary | ICD-10-CM | POA: Diagnosis present

## 2012-03-10 DIAGNOSIS — E785 Hyperlipidemia, unspecified: Secondary | ICD-10-CM | POA: Diagnosis present

## 2012-03-10 DIAGNOSIS — Z87891 Personal history of nicotine dependence: Secondary | ICD-10-CM

## 2012-03-10 DIAGNOSIS — I252 Old myocardial infarction: Secondary | ICD-10-CM

## 2012-03-10 DIAGNOSIS — R079 Chest pain, unspecified: Secondary | ICD-10-CM

## 2012-03-10 LAB — CBC
HCT: 39.8 % (ref 39.0–52.0)
MCHC: 34.7 g/dL (ref 30.0–36.0)
MCV: 82.6 fL (ref 78.0–100.0)
RDW: 12.2 % (ref 11.5–15.5)

## 2012-03-10 LAB — BASIC METABOLIC PANEL
BUN: 11 mg/dL (ref 6–23)
Calcium: 10 mg/dL (ref 8.4–10.5)
Creatinine, Ser: 0.8 mg/dL (ref 0.50–1.35)
GFR calc Af Amer: >90 mL/min (ref >90)
GFR calc non Af Amer: >90 mL/min (ref >90)
Potassium: 4.1 mEq/L (ref 3.5–5.1)

## 2012-03-10 LAB — HEPATIC FUNCTION PANEL
ALT: 34 U/L (ref 0–53)
Albumin: 3.9 g/dL (ref 3.5–5.2)
Alkaline Phosphatase: 57 U/L (ref 39–117)
Total Protein: 6.6 g/dL (ref 6.0–8.3)

## 2012-03-10 LAB — PLATELET INHIBITION P2Y12: Platelet Function  P2Y12: 81 [PRU] — ABNORMAL LOW (ref 194–418)

## 2012-03-10 NOTE — ED Provider Notes (Signed)
1645 Phone call from  with Nada Boozer with [facility] cardiology and she would like for the patient to be discharged home. Patient should be n.p.o. after midnight and he should report to [facility] cardiology at 7 AM for a stress test in the morning. Patient understands  is okay with the plan and he is ready for discharge. Pain free.  Remi Haggard, NP 03/11/12 1229

## 2012-03-10 NOTE — ED Notes (Signed)
Patient reports he had chest pain for the past 5-6 days.  He treated with otc meds for heartburn.  He has hx of mi.  Patient took nitro with some relief.  Patient has taken asprin 325mg  today.  Patient did not take nitro today.  Patient has been pain free since yesterday.  Patient denies n/v  Denies diaphoresis,  Denies syncope

## 2012-03-10 NOTE — ED Provider Notes (Signed)
History     CSN: [id number]  Arrival date & time 03/10/12  1202   First MD Initiated Contact with Patient 03/10/12 1253      Chief Complaint  Patient presents with  . Chest Pain    (Consider location/radiation/quality/duration/timing/severity/associated sxs/prior treatment) HPI Comments: 51 year old male with a history of CAD and stent placement in June, hypertension, type 2 diabetes, and MI presents emergency department with a chief complaint of chest pain.  Onset of symptoms began 5-6 days ago and were described more as a chest discomfort that is characterized as an uncomfortable pressure.  Severity is rated at a 2-3/10.  Pain does not radiate and is not exertional.  Patient denies associated symptoms including nausea, diaphoresis, shortness of breath, PND, orthopnea, leg swelling, fever, night sweats or chills.  Patient does however report an episode of chest pain that occurred 2-3 days ago that was more severe and woke him up from sleep.  This pain was relieved by nitroglycerin.  Pt is seen by Dr. Allyson Sabal at [facility]. He reports being compliant on plavix adn taking 324 ASA today. Pt denies any current symptoms.   Patient is a 52 y.o. male presenting with chest pain. The history is provided by the patient.  Chest Pain Pertinent negatives for primary symptoms include no fever, no shortness of breath, no cough, no wheezing, no palpitations, no abdominal pain and no dizziness.  Pertinent negatives for associated symptoms include no diaphoresis, no numbness and no weakness.     Past Medical History  Diagnosis Date  . Coronary artery disease   . Hypertension   . Hypercholesterolemia   . Angina   . S/P angioplasty with stent, DES, to proximal and mid LCX 12/15/11 12/15/2011  . Myocardial infarction ~ 2010  . Type II diabetes mellitus   . GERD (gastroesophageal reflux disease)   . Arthritis     "lower back; shoulders; neck"  . Chronic lower back pain     Past Surgical History  Procedure  Date  . Cardiac catheterization   . Coronary angioplasty   . Coronary angioplasty with stent placement 2010 and 2011    x 2 cardaic stents  . Coronary angioplasty with stent placement 12/15/11    "2; total is now 4"  . Excisional hemorrhoidectomy 1984    Family History  Problem Relation Age of Onset  . Coronary artery disease Other   . Leukemia Mother   . Prostate cancer Father     History  Substance Use Topics  . Smoking status: Former Smoker -- 1.0 packs/day for 10 years    Types: Cigarettes    Quit date: 04/07/2009  . Smokeless tobacco: Never Used  . Alcohol Use: Yes     12/15/11 "til 2010, drank a pint of gin/day at least"      Review of Systems  Constitutional: Negative for fever, chills, diaphoresis, activity change and appetite change.  HENT: Negative for congestion and neck pain.   Eyes: Negative for visual disturbance.  Respiratory: Positive for chest tightness. Negative for cough, shortness of breath and wheezing.   Cardiovascular: Positive for chest pain. Negative for palpitations and leg swelling.  Gastrointestinal: Negative for abdominal pain.  Genitourinary: Negative for dysuria, urgency and frequency.  Musculoskeletal: Negative for myalgias.  Skin: Negative for color change and wound.  Neurological: Negative for dizziness, syncope, weakness, light-headedness, numbness and headaches.  Psychiatric/Behavioral: Negative for confusion.  All other systems reviewed and are negative.    Allergies  Bee venom and Shellfish allergy  Home Medications   Current Outpatient Rx  Name Route Sig Dispense Refill  . ASPIRIN 325 MG PO TBEC Oral Take 325 mg by mouth daily.      Marland Kitchen CARVEDILOL 12.5 MG PO TABS Oral Take 12.5 mg by mouth 2 (two) times daily with a meal.     . CLOPIDOGREL BISULFATE 75 MG PO TABS Oral Take 75 mg by mouth daily.      . ISOSORBIDE MONONITRATE ER 60 MG PO TB24 Oral Take 1 tablet (60 mg total) by mouth daily. 30 tablet 5  . LISINOPRIL 5 MG PO TABS  Oral Take 1 tablet (5 mg total) by mouth daily. 30 tablet 5  . METFORMIN HCL ER (OSM) 1000 MG PO TB24 Oral Take 1 tablet (1,000 mg total) by mouth 2 (two) times daily with a meal. 1 tablet 1    Resume on 12/17/11.  Marland Kitchen NITROGLYCERIN 0.4 MG SL SUBL Sublingual Place 1 tablet (0.4 mg total) under the tongue every 5 (five) minutes as needed for chest pain. 25 tablet 2  . PITAVASTATIN CALCIUM 2 MG PO TABS Oral Take 1 tablet by mouth daily.    . TRAMADOL HCL 50 MG PO TABS Oral Take 50 mg by mouth every 6 (six) hours as needed. pain      BP 136/94  Pulse 75  Resp 18  Ht 5\' 8"  (1.727 m)  Wt 200 lb (90.719 kg)  BMI 30.41 kg/m2  SpO2 97%  Physical Exam  Nursing note and vitals reviewed. Constitutional: He appears well-developed and well-nourished. No distress.  HENT:  Head: Normocephalic and atraumatic.  Eyes: Conjunctivae normal and EOM are normal. Pupils are equal, round, and reactive to light.  Neck: Normal range of motion. Neck supple. Normal carotid pulses and no JVD present. Carotid bruit is not present. No rigidity. Normal range of motion present.  Cardiovascular: Normal rate, regular rhythm, S1 normal, S2 normal, normal heart sounds, intact distal pulses and normal pulses.  Exam reveals no gallop and no friction rub.   No murmur heard.      No pitting edema bilaterally, RRR, no aberrant sounds on auscultations, distal pulses intact, no carotid bruit or JVD.   Pulmonary/Chest: Effort normal and breath sounds normal. No accessory muscle usage or stridor. No respiratory distress. He exhibits no tenderness and no bony tenderness.  Abdominal: Bowel sounds are normal.       Soft non tender. Non pulsatile aorta.   Skin: Skin is warm, dry and intact. No rash noted. He is not diaphoretic. No cyanosis. Nails show no clubbing.    ED Course  Procedures (including critical care time)  Labs Reviewed  BASIC METABOLIC PANEL - Abnormal; Notable for the following:    Glucose, Bld 105 (*)     All other  components within normal limits  CBC  POCT I-STAT TROPONIN I   Dg Chest 2 View  03/10/2012  *RADIOLOGY REPORT*  Clinical Data: Chest pain  CHEST - 2 VIEW  Comparison: December 14, 2011  Findings: Lungs clear.  Heart size and pulmonary vascularity are normal.  No adenopathy.  There is a stent in the left circumflex coronary artery. No bone lesions.  IMPRESSION: No edema or consolidation.   Original Report Authenticated By: Arvin Collard. WOODRUFF III, M.D.      No diagnosis found.    MDM  Chest pain  Consult: Cardiology [facility] to see in ER.   Concern for cardiac etiology of Chest Pain. Cardiology has been consulted and will see patient  in the ED for likely admit. Pt does not meet criteria for CP protocol and a further evaluation is recommended. Pt has been re-evaluated prior to consult and VSS, NAD, heart RRR, pain 0/10, lungs CTAB. No acute abnormalities found on EKG and first round of cardiac enzymes negative. This case was discussed with Dr. Ignacia Palma who has seen the patient and agrees with plan to admit.          Jaci Carrel, [place] 03/10/12 1537

## 2012-03-10 NOTE — ED Notes (Signed)
Cardiology at bedside.

## 2012-03-10 NOTE — ED Notes (Signed)
Pt undressed, in gown, on monitor, continuous pulse oximetry and blood pressure cuff 

## 2012-03-10 NOTE — ED Notes (Signed)
Pt has returned from being out of the department; pt placed back on monitor, continuous pulse oximetry and blood pressure cuff 

## 2012-03-10 NOTE — H&P (Signed)
[name] is an 51 y.o. male.    Cardiologist Dr, Rennis Golden Chief Complaint: chest pain for 5-6 days  HPI: 50 year old male with 5-6 days of chest pain he treated the pain with otc meds for heartburn. He has hx of mi. 2 Days ago pain woke him from sleep he did take 1 nitro with relief. Patient has taken asprin 325mg  today. Patient did not take nitro today because he has been pain free since yesterday.  History of coronary artery disease with multiple non-ST elevation MIs, and status post three drug- eluting stents, two to the left circumflex and one to the RCA, was admitted on April 01, 2010, at which time he had a 70% in-stent restenosis of the distal portion of the proximal stent and a 20% in- stent restenosis of the distal stent in the left circumflex as well as 70% in-stent restenosis in the mid vessel stent to the RCA. He had a cutting balloon atherectomy to the right coronary artery at that time and additional stent to the circumflex vessel,  08/25/10 underwent cutting balloon arthrectomy to LCX artery. 01/26/2011 again with chest pain and needing successful percutaneous coronary intervention stenting of aggressive in-stent restenosis within the previously stented atrioventricular groove circumflex with Taxus iron drug-eluting stent (prior stents being Promus). 05/10/12 cardiac cath with patent stents, 40% mid LAD.  Last cath and PCI :  Complete cath report:  12/15/11 1. Left main; normal  2. LAD; minor irregularities  3. Left circumflex; 75% segmental proximal just proximal to the previously placed stents.75 Percent in-stent restenosis" within the mid circumflex proximal.  4. Right coronary artery; patent stent in the midportion. 200 mcg of nitroglycerin was administered because of what appeared to be spasm on the proximal and distal portion of the vessel which improved angiographically  5. Left ventriculography; RAO left ventriculogram was performed using  25 mL of Visipaque dye at 12 mL/second.  The overall LVEF estimated  60 % Without wall motion abnormalities  History also includes tobacco abuse(quit two years ago), diabetes, HLD, cocaine use, persistently elevated CK despite discontinuing a number of his medications.  In June of this year he developed 4-5 days of chest pain. No MI, but did under go cardiac cath and found to have progression in the proximal circumflex and "in-stent" restenosis within the mid AV groove circumflex. He received DES to both areas. The procedure was completed without complications. He has been intolerant to most statins in the past but was to try pravastatin and CoQ10.   He also has microvascular angina.    Dr. Rennis Golden saw him in July 2013 and discussed CABG, medication and possible brachytherapy.  Pravastatin changed to Livalo.    Past Medical History  Diagnosis Date  . Coronary artery disease   . Hypertension   . Hypercholesterolemia   . Angina   . S/P angioplasty with stent, DES, to proximal and mid LCX 12/15/11 12/15/2011  . Myocardial infarction ~ 2010  . Type II diabetes mellitus   . GERD (gastroesophageal reflux disease)   . Arthritis     "lower back; shoulders; neck"  . Chronic lower back pain   . Angina at rest 03/10/2012    Past Surgical History  Procedure Date  . Cardiac catheterization   . Coronary angioplasty   . Coronary angioplasty with stent placement 2010 and 2011    x 2 cardaic stents  . Coronary angioplasty with stent placement 12/15/11    "2; total is now 4"  .  Excisional hemorrhoidectomy 1984    Family History  Problem Relation Age of Onset  . Coronary artery disease Other   . Leukemia Mother   . Prostate cancer Father    Social History:  reports that he quit smoking about 2 years ago. His smoking use included Cigarettes. He has a 10 pack-year smoking history. He has never used smokeless tobacco. He reports that he drinks alcohol. He reports that he uses illicit drugs (Cocaine).  Allergies:  Allergies  Allergen  Reactions  . Bee Venom Anaphylaxis and Hives  . Shellfish Allergy Anaphylaxis and Hives    Outpatient medications: ASA 325 daily Coreg 12.5 mg BID Imdur 30 mg every AM Metformin 1000 mg BID Plavix 75 mg daily Lisinopril 5 mg daily NTG 1/150 prn Tramadol 50 mg PRN Livalo --he has not yet started    Results for orders placed during the hospital encounter of 03/10/12 (from the past 48 hour(s))  CBC     Status: Normal   Collection Time   03/10/12 12:59 PM      Component Value Range Comment   WBC 4.3  4.0 - 10.5 K/uL    RBC 4.82  4.22 - 5.81 MIL/uL    Hemoglobin 13.8  13.0 - 17.0 g/dL    HCT 16.1  09.6 - 04.5 %    MCV 82.6  78.0 - 100.0 fL    MCH 28.6  26.0 - 34.0 pg    MCHC 34.7  30.0 - 36.0 g/dL    RDW 40.9  81.1 - 91.4 %    Platelets 206  150 - 400 K/uL   BASIC METABOLIC PANEL     Status: Abnormal   Collection Time   03/10/12 12:59 PM      Component Value Range Comment   Sodium 144  135 - 145 mEq/L    Potassium 4.1  3.5 - 5.1 mEq/L    Chloride 108  96 - 112 mEq/L    CO2 26  19 - 32 mEq/L    Glucose, Bld 105 (*) 70 - 99 mg/dL    BUN 11  6 - 23 mg/dL    Creatinine, Ser 7.82  0.50 - 1.35 mg/dL    Calcium 95.6  8.4 - 10.5 mg/dL    GFR calc non Af Amer >90  >90 mL/min    GFR calc Af Amer >90  >90 mL/min   POCT I-STAT TROPONIN I     Status: Normal   Collection Time   03/10/12  1:08 PM      Component Value Range Comment   Troponin i, poc 0.00  0.00 - 0.08 ng/mL    Comment 3             Dg Chest 2 View  03/10/2012  *RADIOLOGY REPORT*  Clinical Data: Chest pain  CHEST - 2 VIEW  Comparison: December 14, 2011  Findings: Lungs clear.  Heart size and pulmonary vascularity are normal.  No adenopathy.  There is a stent in the left circumflex coronary artery. No bone lesions.  IMPRESSION: No edema or consolidation.   Original Report Authenticated By: Arvin Collard. WOODRUFF III, M.D.     ROS: General:no colds or fevers Skin:no rashes or skin ulcers HEENT:no blurred vision OZ:HYQMV  pain X 5-6 days  PUL:no SOB GI:no diarrhea, constipation, no melena GU:no hematuria, no dysuria MS:no arthritic pain Neuro:no syncope, no lightheadedness Endo:glucose stable, no thyroid disease   Blood pressure 136/94, pulse 75, resp. rate 18, height 5\' 8"  (1.727 m), weight  90.719 kg (200 lb), SpO2 97.00%. PE: General:alert and oriented AAM. Pleasant affect, NAD Skin:warma and dry, brisk capillary refill HEENT:normocephalic, sclera clear  Neck:suppple, no JVD, no bruits Heart:S1S2, RRR, no murmur, gallup rub or click. Lungs:clear without rales, rhonchi or wheezes Abd:+ BS, soft, non tender Ext:no edema. Neuro:alert and oriented X 3     Assessment/Plan Principal Problem:  *Angina at rest Active Problems:  CAD - multiple PCI to LCx (DES & Cutting Balloon PTCA), also stent to RCA  S/P angioplasty with stent, DES, to proximal and mid LCX 12/15/11  DM  Hypertension  Hyperlipidemia  Allergy to contrast media (used for diagnostic x-rays)  PLAN:  Pt concerned, he had discussed options with Dr. Rennis Golden.  He believes CABG would be good choice. We discussed options,  MD to see.  ? Cath vs. ST  INGOLD,LAURA R 03/10/2012, 2:39 PM  I have seen & examined the patient in the ER along with Nada Boozer, NP. I agree with her findings, examination as noted above.  [name] is a very pleasant 51 year old woman well known to our service and multiple PCI's in the RCA and circumflex with in-stent restenosis in the quite aggressive in nature.  In the past she's had exertional chest discomfort however this current so services is involved and waking up in the morning with chest pressure that was not nearly as significant as is his MI couple days ago. This was relieved with nitroglycerin. He then proceeded to go about his normal activities cleaning working out at the gym without any angina whatsoever only that I get another episode while at work not really exerting himself.  I had a long discussion Mr.  [name] about the possible etiologies of his chest -- minor sedation this is unlikely to be due to in-stent restenosis of even 75% as was the case earlier this year in June.  Significant stenosis should not cause resting pain unless this is due to an unstable process with plaque rupture or hematoma.  In this case one would expect such a significant stenosis to also cause exertional discomfort. More likely the case is that he has some type of coronary spasm as it occurred in the morning and was relatively normal activity and not with exertion. Estimated will be associated with the stented segments, but further intervention on these lesions would not accurately treat spasm pain.   Certainly as lesion significant of cause rest pain would show up in a noninvasive study.    At this point, with negative biomarkers indicating that could be event from couple days ago was not an infarction, and no active pain he should be safe for discharge with plan to do a noninvasive evaluation in the morning with a treadmill Myoview stress test.  He actually is in favor this option, preferring to avoid yet another invasive evaluation unless warranted by the Myoview result.  Will check P2Y12 platelet inhibition assay to ensure that his Plavix is probably be optimal antiplatelet effect.  We have scheduled him for a treadmill Myoview in the morning at [facility]. She will followup with Dr. Rennis Golden after the test. Certainly there is a high-risk result he'll be referred for repeat catheterization at which case, series conversation he had about the most optimally of treating any potential in-stent restenosis of either vessel, since repeat intervention is unlikely to be successful for long-term symptom relief. Non-LIMA to LAD CABG would be a possible solution, however at his age burning the CABG bridge without LIMA  unit diabetic is suboptimal.  Pending results of stress test, my recommendation will be to  optimize medical therapy including titration up of his dose of Imdur as well as potentially restarting Ranexa. He does not have a lot of blood pressure room to allow for increasing his ACE inhibitor dose but no potential large and versus adding amlodipine.  Marykay Lex, M.D., M.S. [facility] [address] [place], [place]  [address]  [phone] Pager # [phone]  03/10/2012 4:54 PM

## 2012-03-10 NOTE — ED Notes (Signed)
Informed bed placement that patient will not be admitted and does not need assigned bed.

## 2012-03-11 SURGERY — LEFT HEART CATHETERIZATION WITH CORONARY ANGIOGRAM
Anesthesia: LOCAL

## 2012-03-11 NOTE — ED Provider Notes (Signed)
Medical screening examination/treatment/procedure(s) were conducted as a shared visit with non-physician practitioner(s) and myself.  I personally evaluated the patient during the encounter 51 yo man with chest pressure, prior Hx of cardiac stents.  Today, PE is benign, Cardiac markers negative, EKG non-acute.  Pt seen by [facility], felt to be stable to return tomorrow for outpatient stress test at [facility] office.  Carleene Cooper III, MD 03/11/12 [phone]

## 2012-03-11 NOTE — ED Provider Notes (Signed)
Medical screening examination/treatment/procedure(s) were conducted as a shared visit with non-physician practitioner(s) and myself.  I personally evaluated the patient during the encounter See my prior note.  Pt had been seen for chest pain, had negative workup, released to have stress test as outpatient tomorrow.  Carleene Cooper III, MD 03/11/12 1350

## 2012-06-21 ENCOUNTER — Emergency Department (HOSPITAL_COMMUNITY)
Admission: EM | Admit: 2012-06-21 | Discharge: 2012-06-21 | Disposition: A | Discharge status: Home / Self Care | Payer: Medicaid Other | Attending: Emergency Medicine | Admitting: Emergency Medicine

## 2012-06-21 ENCOUNTER — Encounter (HOSPITAL_COMMUNITY): Payer: Self-pay | Admitting: Family Medicine

## 2012-06-21 DIAGNOSIS — R0981 Nasal congestion: Secondary | ICD-10-CM

## 2012-06-21 DIAGNOSIS — Z8719 Personal history of other diseases of the digestive system: Secondary | ICD-10-CM | POA: Insufficient documentation

## 2012-06-21 DIAGNOSIS — J3489 Other specified disorders of nose and nasal sinuses: Secondary | ICD-10-CM | POA: Insufficient documentation

## 2012-06-21 DIAGNOSIS — I2510 Atherosclerotic heart disease of native coronary artery without angina pectoris: Secondary | ICD-10-CM | POA: Insufficient documentation

## 2012-06-21 DIAGNOSIS — M545 Low back pain, unspecified: Secondary | ICD-10-CM | POA: Insufficient documentation

## 2012-06-21 DIAGNOSIS — E7800 Pure hypercholesterolemia, unspecified: Secondary | ICD-10-CM | POA: Insufficient documentation

## 2012-06-21 DIAGNOSIS — Z87891 Personal history of nicotine dependence: Secondary | ICD-10-CM | POA: Insufficient documentation

## 2012-06-21 DIAGNOSIS — I10 Essential (primary) hypertension: Secondary | ICD-10-CM | POA: Insufficient documentation

## 2012-06-21 DIAGNOSIS — Z8679 Personal history of other diseases of the circulatory system: Secondary | ICD-10-CM | POA: Insufficient documentation

## 2012-06-21 DIAGNOSIS — R6889 Other general symptoms and signs: Secondary | ICD-10-CM | POA: Insufficient documentation

## 2012-06-21 DIAGNOSIS — R059 Cough, unspecified: Secondary | ICD-10-CM | POA: Insufficient documentation

## 2012-06-21 DIAGNOSIS — F1410 Cocaine abuse, uncomplicated: Secondary | ICD-10-CM | POA: Insufficient documentation

## 2012-06-21 DIAGNOSIS — E119 Type 2 diabetes mellitus without complications: Secondary | ICD-10-CM | POA: Insufficient documentation

## 2012-06-21 DIAGNOSIS — Z79899 Other long term (current) drug therapy: Secondary | ICD-10-CM | POA: Insufficient documentation

## 2012-06-21 DIAGNOSIS — G8929 Other chronic pain: Secondary | ICD-10-CM | POA: Insufficient documentation

## 2012-06-21 DIAGNOSIS — Z7902 Long term (current) use of antithrombotics/antiplatelets: Secondary | ICD-10-CM | POA: Insufficient documentation

## 2012-06-21 DIAGNOSIS — I252 Old myocardial infarction: Secondary | ICD-10-CM | POA: Insufficient documentation

## 2012-06-21 DIAGNOSIS — R05 Cough: Secondary | ICD-10-CM | POA: Insufficient documentation

## 2012-06-21 DIAGNOSIS — Z8739 Personal history of other diseases of the musculoskeletal system and connective tissue: Secondary | ICD-10-CM | POA: Insufficient documentation

## 2012-06-21 DIAGNOSIS — Z7982 Long term (current) use of aspirin: Secondary | ICD-10-CM | POA: Insufficient documentation

## 2012-06-21 DIAGNOSIS — L299 Pruritus, unspecified: Secondary | ICD-10-CM | POA: Insufficient documentation

## 2012-06-21 MED ORDER — GUAIFENESIN ER 600 MG PO TB12: 600.0000 mg | ORAL_TABLET | Freq: Two times a day (BID) | ORAL | Status: DC

## 2012-06-21 MED ORDER — LORATADINE 10 MG PO TABS: 10.0000 mg | ORAL_TABLET | Freq: Every day | ORAL | Status: DC

## 2012-06-21 NOTE — ED Notes (Signed)
Per pt 2 days ago he started having cough, sneezing, scratchy throat.

## 2012-06-21 NOTE — ED Provider Notes (Signed)
History     CSN: [id number]  Arrival date & time 06/21/12  4098   First MD Initiated Contact with Patient 06/21/12 [phone]      Chief Complaint  Patient presents with  . Cough  . Nasal Congestion    (Consider location/radiation/quality/duration/timing/severity/associated sxs/prior treatment) HPI  Pt to the ER with complaints of sneezing, itchy ear and itchy throat for 2 days. He has not tried anything  at home. 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 He  Denies having any cough, but clears his throat intermittently. Denies sore throat, ear pain, N/V/D,  fevers, weakness, SOB, CP. nad vss  Past Medical History  Diagnosis Date  . Coronary artery disease   . Hypertension   . Hypercholesterolemia   . Angina   . S/P angioplasty with stent, DES, to proximal and mid LCX 12/15/11 12/15/2011  . Myocardial infarction ~ 2010  . Type II diabetes mellitus   . GERD (gastroesophageal reflux disease)   . Arthritis     "lower back; shoulders; neck"  . Chronic lower back pain   . Angina at rest 03/10/2012    Past Surgical History  Procedure Date  . Cardiac catheterization   . Coronary angioplasty   . Coronary angioplasty with stent placement 2010 and 2011    x 2 cardaic stents  . Coronary angioplasty with stent placement 12/15/11    "2; total is now 4"  . Excisional hemorrhoidectomy 1984    Family History  Problem Relation Age of Onset  . Coronary artery disease Other   . Leukemia Mother   . Prostate cancer Father     History  Substance Use Topics  . Smoking status: Former Smoker -- 1.0 packs/day for 10 years    Types: Cigarettes    Quit date: 04/07/2009  . Smokeless tobacco: Never Used  . Alcohol Use: Yes     Comment: 12/15/11 "til 2010, drank a pint of gin/day at least"      Review of Systems  Review of Systems  Gen: no weight loss, fevers, chills, night sweats  Eyes: no discharge or drainage, no occular pain or visual  changes, +itchy eyes Nose: no epistaxis + rhinorrhea  Mouth: no dental pain, no sore throat, itchy throat Neck: no neck pain  Lungs:No wheezing, coughing or hemoptysis CV: no chest pain, palpitations, dependent edema or orthopnea  Abd: no abdominal pain, nausea, vomiting  GU: no dysuria or gross hematuria  MSK:  No abnormalities  Neuro: no headache, no focal neurologic deficits  Skin: no abnormalities Psyche: negative.   Allergies  Bee venom and Shellfish allergy  Home Medications   Current Outpatient Rx  Name  Route  Sig  Dispense  Refill  . ASPIRIN 325 MG PO TBEC   Oral   Take 325 mg by mouth daily.           Marland Kitchen CARVEDILOL 12.5 MG PO TABS   Oral   Take 12.5 mg by mouth 2 (two) times daily with a meal.          . CLOPIDOGREL BISULFATE 75 MG PO TABS   Oral   Take 75 mg by mouth daily.           . ISOSORBIDE MONONITRATE ER 60 MG PO TB24   Oral   Take 60 mg by mouth daily.         Marland Kitchen LISINOPRIL 5 MG PO TABS   Oral   Take 5  mg by mouth daily.         Marland Kitchen METFORMIN HCL ER (OSM) 1000 MG PO TB24   Oral   Take 1 tablet (1,000 mg total) by mouth 2 (two) times daily with a meal.   1 tablet   1     Resume on 12/17/11.   Marland Kitchen PITAVASTATIN CALCIUM 2 MG PO TABS   Oral   Take 1 tablet by mouth daily.         . TRAMADOL HCL 50 MG PO TABS   Oral   Take 50 mg by mouth every 6 (six) hours as needed. pain         . GUAIFENESIN ER 600 MG PO TB12   Oral   Take 1 tablet (600 mg total) by mouth 2 (two) times daily.   24 tablet   0   . LORATADINE 10 MG PO TABS   Oral   Take 1 tablet (10 mg total) by mouth daily.   14 tablet   0   . NITROGLYCERIN 0.4 MG SL SUBL   Sublingual   Place 1 tablet (0.4 mg total) under the tongue every 5 (five) minutes as needed for chest pain.   25 tablet   2     BP 123/72  Pulse 68  Temp 98 F (36.7 C) (Oral)  Resp 16  SpO2 98%  Physical Exam  Nursing note and vitals reviewed. Constitutional: He appears well-developed and  well-nourished. No distress.  HENT:  Head: Normocephalic and atraumatic.  Nose: Rhinorrhea present. No sinus tenderness. No epistaxis. Right sinus exhibits no maxillary sinus tenderness and no frontal sinus tenderness. Left sinus exhibits no maxillary sinus tenderness and no frontal sinus tenderness.  Eyes: Pupils are equal, round, and reactive to light.  Neck: Normal range of motion. Neck supple.  Cardiovascular: Normal rate and regular rhythm.   Pulmonary/Chest: Effort normal.  Abdominal: Soft.  Neurological: He is alert.  Skin: Skin is warm and dry.    ED Course  Procedures (including critical care time)  Labs Reviewed - No data to display No results found.   1. Sinus congestion       MDM  Rx Mucinex and Claritin.  Pt is well and needs no labs or imaging at this time.  No abx indicated at this time. Pt to follow-up with his PCP.  Pt has been advised of the symptoms that warrant their return to the ED. Patient has voiced understanding and has agreed to follow-up with the PCP or specialist.        Dorthula Matas, PA 06/21/12 [phone]

## 2012-06-22 NOTE — ED Provider Notes (Signed)
Medical screening examination/treatment/procedure(s) were performed by non-physician practitioner and as supervising physician I was immediately available for consultation/collaboration.  Talita Recht R. Eagle Pitta, MD 06/22/12 1538 

## 2012-07-15 IMAGING — CR DG LUMBAR SPINE COMPLETE 4+V
5 series · 5 of 5 positions shown · non-contrast
Comparison: None.

CLINICAL DATA: Low back pain.

LUMBAR SPINE - COMPLETE 4+ VIEW

[t l-spine a.p.]
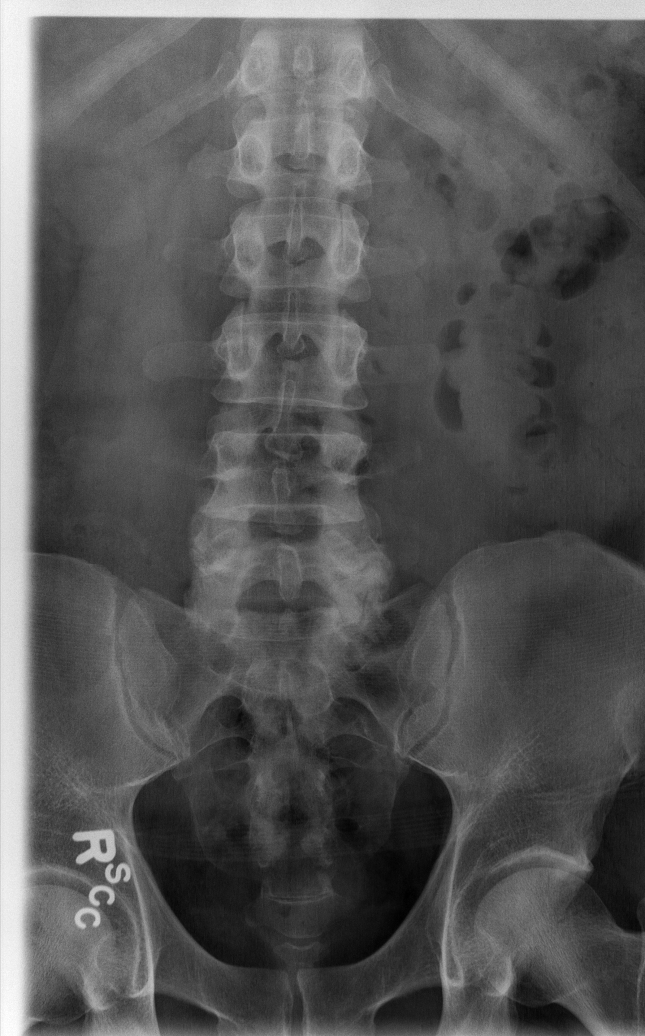

[t l-spine oblique exposure (1 of 2)]
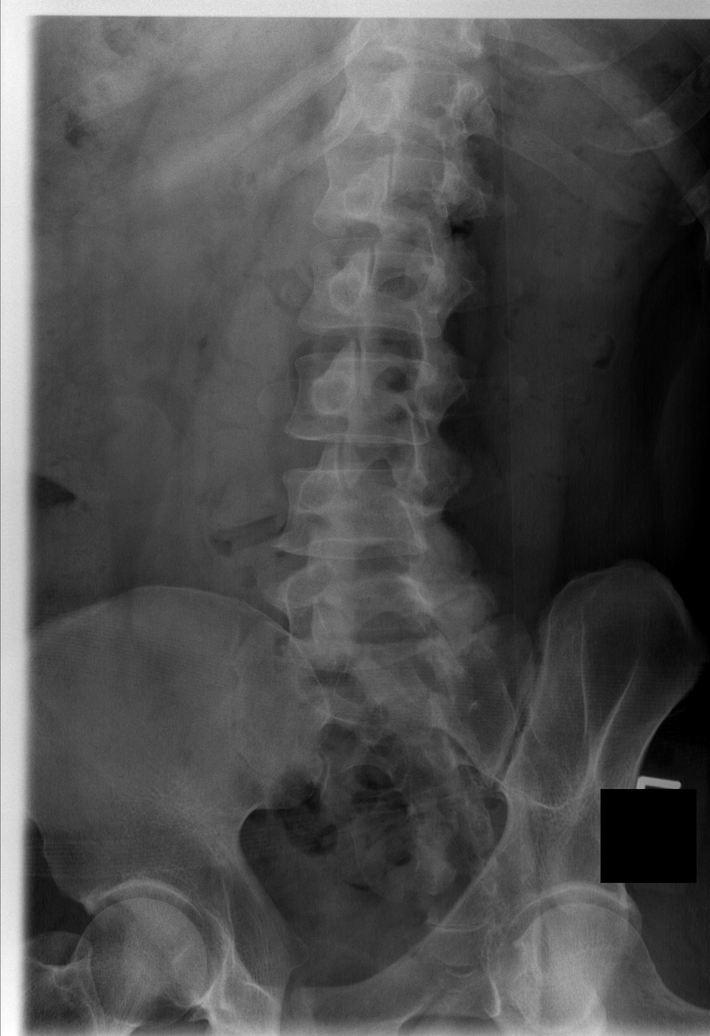

[t l-spine oblique exposure (2 of 2)]
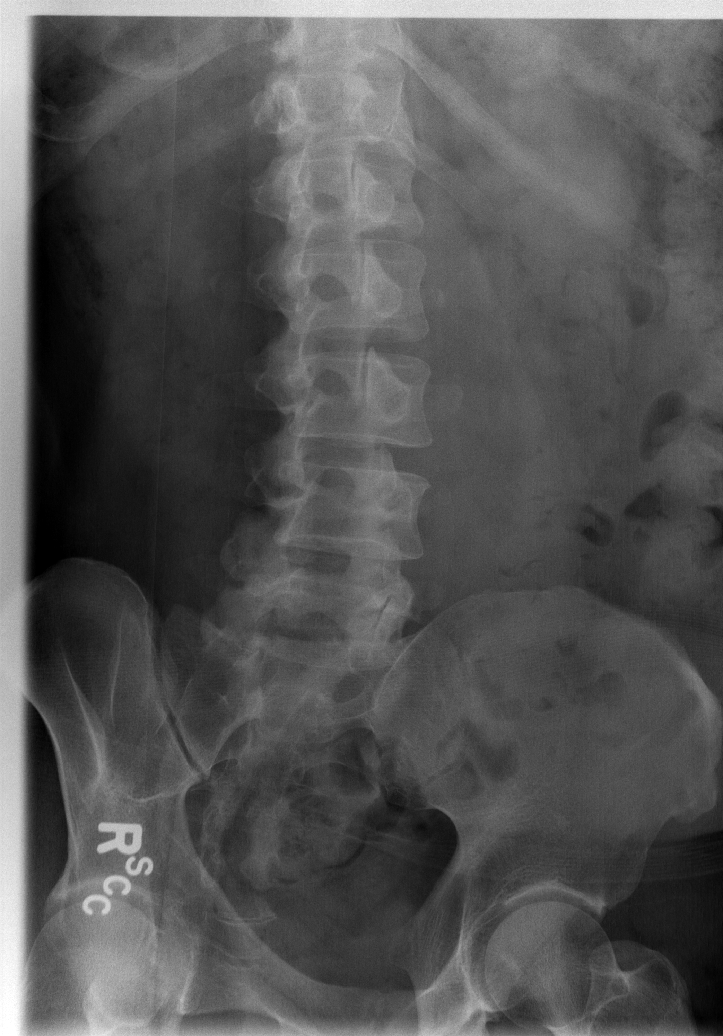

[t l-spine lat]
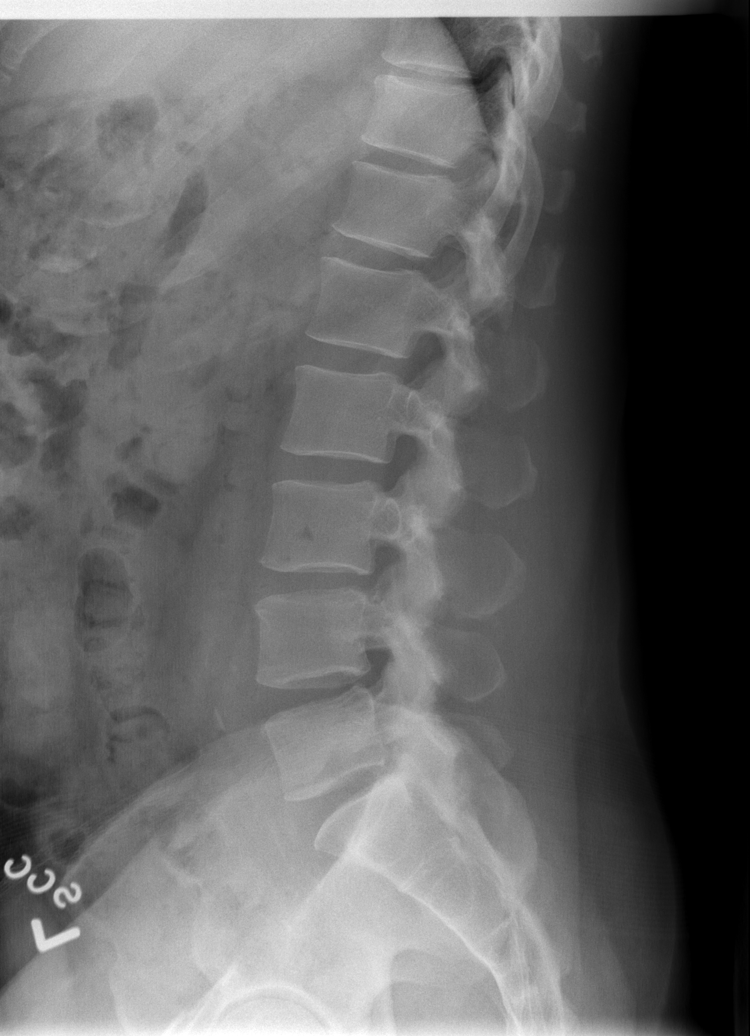

[t l-spine l5-s1 spot]
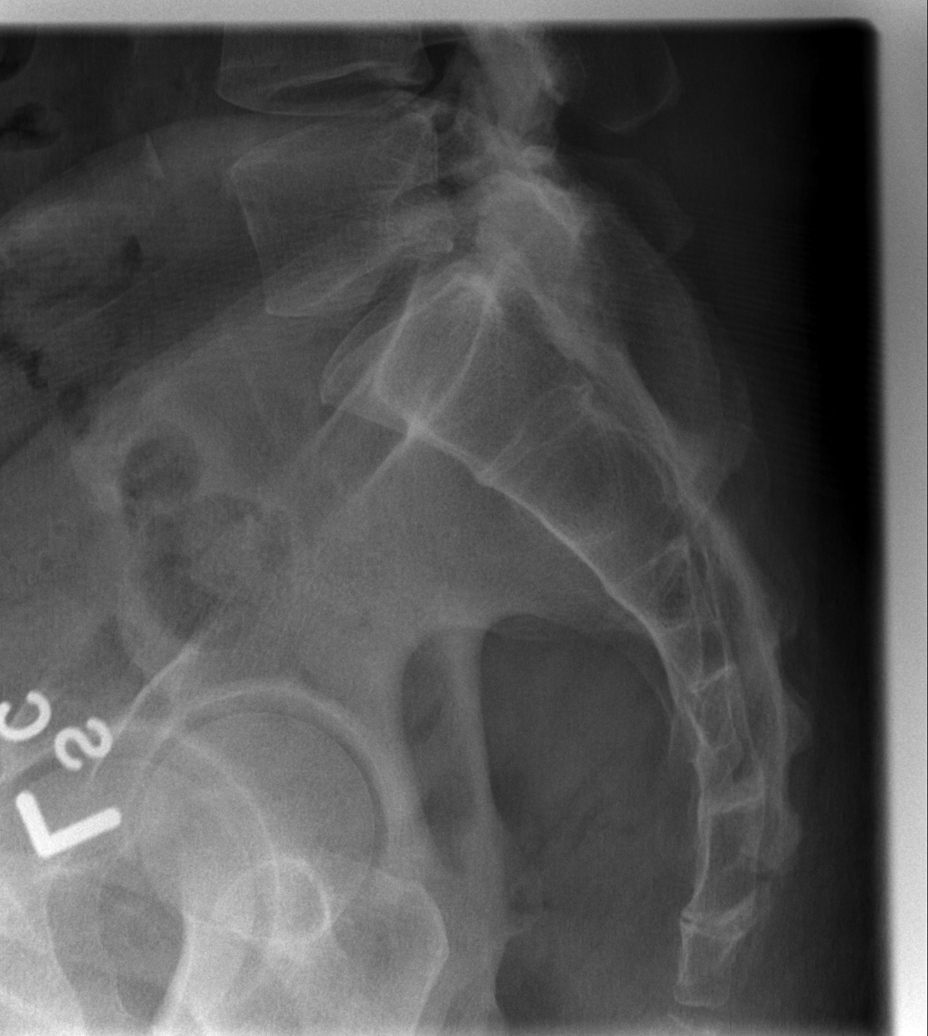

[5 of 5 positions shown; findings below may reference images not displayed]

FINDINGS: Moderate facet hypertrophy present bilaterally at L4-5
and L5-S1.  No associated evidence of spondylolisthesis or
spondylolysis. Disc space heights are relatively well preserved.
No fracture evident.  No bony lesions.
IMPRESSION: Degenerative facet hypertrophy at L4-5 and L5-S1.

## 2012-07-18 ENCOUNTER — Emergency Department (HOSPITAL_COMMUNITY): Payer: Medicaid Other

## 2012-07-18 ENCOUNTER — Encounter (HOSPITAL_COMMUNITY): Payer: Self-pay | Admitting: Emergency Medicine

## 2012-07-18 ENCOUNTER — Other Ambulatory Visit: Payer: Self-pay

## 2012-07-18 ENCOUNTER — Emergency Department (HOSPITAL_COMMUNITY)
Admission: EM | Admit: 2012-07-18 | Discharge: 2012-07-18 | Disposition: A | Discharge status: Home / Self Care | Payer: Medicaid Other | Attending: Emergency Medicine | Admitting: Emergency Medicine

## 2012-07-18 DIAGNOSIS — Z79899 Other long term (current) drug therapy: Secondary | ICD-10-CM | POA: Insufficient documentation

## 2012-07-18 DIAGNOSIS — E119 Type 2 diabetes mellitus without complications: Secondary | ICD-10-CM | POA: Insufficient documentation

## 2012-07-18 DIAGNOSIS — Z7982 Long term (current) use of aspirin: Secondary | ICD-10-CM | POA: Insufficient documentation

## 2012-07-18 DIAGNOSIS — I2510 Atherosclerotic heart disease of native coronary artery without angina pectoris: Secondary | ICD-10-CM | POA: Insufficient documentation

## 2012-07-18 DIAGNOSIS — R0602 Shortness of breath: Secondary | ICD-10-CM | POA: Insufficient documentation

## 2012-07-18 DIAGNOSIS — Z87891 Personal history of nicotine dependence: Secondary | ICD-10-CM | POA: Insufficient documentation

## 2012-07-18 DIAGNOSIS — Z8739 Personal history of other diseases of the musculoskeletal system and connective tissue: Secondary | ICD-10-CM | POA: Insufficient documentation

## 2012-07-18 DIAGNOSIS — R059 Cough, unspecified: Secondary | ICD-10-CM | POA: Insufficient documentation

## 2012-07-18 DIAGNOSIS — R05 Cough: Secondary | ICD-10-CM | POA: Insufficient documentation

## 2012-07-18 DIAGNOSIS — R0789 Other chest pain: Secondary | ICD-10-CM | POA: Insufficient documentation

## 2012-07-18 DIAGNOSIS — Z8719 Personal history of other diseases of the digestive system: Secondary | ICD-10-CM | POA: Insufficient documentation

## 2012-07-18 DIAGNOSIS — I209 Angina pectoris, unspecified: Secondary | ICD-10-CM | POA: Insufficient documentation

## 2012-07-18 DIAGNOSIS — M545 Low back pain, unspecified: Secondary | ICD-10-CM | POA: Insufficient documentation

## 2012-07-18 DIAGNOSIS — Z7901 Long term (current) use of anticoagulants: Secondary | ICD-10-CM | POA: Insufficient documentation

## 2012-07-18 DIAGNOSIS — I10 Essential (primary) hypertension: Secondary | ICD-10-CM | POA: Insufficient documentation

## 2012-07-18 DIAGNOSIS — E7800 Pure hypercholesterolemia, unspecified: Secondary | ICD-10-CM | POA: Insufficient documentation

## 2012-07-18 DIAGNOSIS — G8929 Other chronic pain: Secondary | ICD-10-CM | POA: Insufficient documentation

## 2012-07-18 DIAGNOSIS — I252 Old myocardial infarction: Secondary | ICD-10-CM | POA: Insufficient documentation

## 2012-07-18 DIAGNOSIS — Z9861 Coronary angioplasty status: Secondary | ICD-10-CM | POA: Insufficient documentation

## 2012-07-18 LAB — CBC WITH DIFFERENTIAL/PLATELET
Basophils Relative: 1 % (ref 0–1)
HCT: 40.1 % (ref 39.0–52.0)
Hemoglobin: 13.6 g/dL (ref 13.0–17.0)
Lymphs Abs: 1.7 10*3/uL (ref 0.7–4.0)
MCHC: 33.9 g/dL (ref 30.0–36.0)
Monocytes Absolute: 0.4 10*3/uL (ref 0.1–1.0)
Monocytes Relative: 8 % (ref 3–12)
Neutro Abs: 2 10*3/uL (ref 1.7–7.7)
Neutrophils Relative %: 48 % (ref 43–77)
RBC: 4.95 MIL/uL (ref 4.22–5.81)

## 2012-07-18 LAB — COMPREHENSIVE METABOLIC PANEL
Alkaline Phosphatase: 55 U/L (ref 39–117)
BUN: 14 mg/dL (ref 6–23)
CO2: 24 mEq/L (ref 19–32)
Chloride: 102 mEq/L (ref 96–112)
Creatinine, Ser: 0.76 mg/dL (ref 0.50–1.35)
GFR calc Af Amer: >90 mL/min (ref >90)
GFR calc non Af Amer: >90 mL/min (ref >90)
Glucose, Bld: 112 mg/dL — ABNORMAL HIGH (ref 70–99)
Potassium: 3.9 mEq/L (ref 3.5–5.1)
Total Bilirubin: 0.5 mg/dL (ref 0.3–1.2)

## 2012-07-18 LAB — TROPONIN I: Troponin I: <0.3 ng/mL (ref <0.30)

## 2012-07-18 NOTE — ED Notes (Signed)
Pt c/o intermittent CP w/ SOB Last x weeks, Last onset @ 2 am, Does not complain of CP now.

## 2012-07-18 NOTE — ED Provider Notes (Signed)
History     CSN: [id number]  Arrival date & time 07/18/12  0815   First MD Initiated Contact with Patient 07/18/12 0818      Chief Complaint  Patient presents with  . Chest Pain    (Consider location/radiation/quality/duration/timing/severity/associated sxs/prior treatment) HPI Pt with episodic chest pain of increasing frequency x 2 weeks. Last episode was last night. No pattern. Not worse with exercise, lying flat or eating. Described more as discomfort. No radiation. No nausea diaphoresis. States he has had decreased exercise tolerance due to SOB over roughly the same period. CAD with stent placement 6 months ago. Followed by Dr Rennis Golden [facility]. No current symptoms Past Medical History  Diagnosis Date  . Coronary artery disease   . Hypertension   . Hypercholesterolemia   . Angina   . S/P angioplasty with stent, DES, to proximal and mid LCX 12/15/11 12/15/2011  . Myocardial infarction ~ 2010  . Type II diabetes mellitus   . GERD (gastroesophageal reflux disease)   . Arthritis     "lower back; shoulders; neck"  . Chronic lower back pain   . Angina at rest 03/10/2012    Past Surgical History  Procedure Date  . Cardiac catheterization   . Coronary angioplasty   . Coronary angioplasty with stent placement 2010 and 2011    x 2 cardaic stents  . Coronary angioplasty with stent placement 12/15/11    "2; total is now 4"  . Excisional hemorrhoidectomy 1984    Family History  Problem Relation Age of Onset  . Coronary artery disease Other   . Leukemia Mother   . Prostate cancer Father     History  Substance Use Topics  . Smoking status: Former Smoker -- 1.0 packs/day for 10 years    Types: Cigarettes    Quit date: 04/07/2009  . Smokeless tobacco: Never Used  . Alcohol Use: Yes     Comment: 12/15/11 "til 2010, drank a pint of gin/day at least"      Review of Systems  Constitutional: Negative for fever and chills.  Respiratory: Positive for cough and shortness  of breath. Negative for wheezing.   Cardiovascular: Positive for chest pain. Negative for palpitations and leg swelling.  Gastrointestinal: Negative for nausea, vomiting and abdominal pain.  Musculoskeletal: Negative for myalgias and back pain.  Skin: Negative for rash and wound.  Neurological: Negative for dizziness, weakness, light-headedness, numbness and headaches.    Allergies  Bee venom and Shellfish allergy  Home Medications   Current Outpatient Rx  Name  Route  Sig  Dispense  Refill  . ASPIRIN 325 MG PO TBEC   Oral   Take 325 mg by mouth daily.           Marland Kitchen CARVEDILOL 12.5 MG PO TABS   Oral   Take 12.5 mg by mouth 2 (two) times daily with a meal.          . CLOPIDOGREL BISULFATE 75 MG PO TABS   Oral   Take 75 mg by mouth daily.           . CO Q 10 100 MG PO CAPS   Oral   Take 1 capsule by mouth daily.         . ISOSORBIDE MONONITRATE ER 60 MG PO TB24   Oral   Take 60 mg by mouth daily.         Marland Kitchen LISINOPRIL 5 MG PO TABS   Oral   Take 5 mg by  mouth daily.         Marland Kitchen METFORMIN HCL 1000 MG PO TABS   Oral   Take 1,000 mg by mouth 2 (two) times daily with a meal.         . MIRTAZAPINE 30 MG PO TABS   Oral   Take 30 mg by mouth at bedtime.         Marland Kitchen NITROGLYCERIN 0.4 MG SL SUBL   Sublingual   Place 0.4 mg under the tongue every 5 (five) minutes as needed. For chest pain         . FISH OIL PO   Oral   Take 1 capsule by mouth daily.          Marland Kitchen PITAVASTATIN CALCIUM 2 MG PO TABS   Oral   Take 1 tablet by mouth at bedtime.          . TRAMADOL HCL 50 MG PO TABS   Oral   Take 100 mg by mouth 2 (two) times daily.           BP 116/78  Pulse 59  Temp 98.2 F (36.8 C) (Oral)  Resp 12  SpO2 98%  Physical Exam  Nursing note and vitals reviewed. Constitutional: He is oriented to person, place, and time. He appears well-developed and well-nourished. No distress.  HENT:  Head: Normocephalic and atraumatic.  Mouth/Throat: Oropharynx  is clear and moist.  Eyes: EOM are normal. Pupils are equal, round, and reactive to light.  Neck: Normal range of motion. Neck supple.  Cardiovascular: Normal rate and regular rhythm.  Exam reveals no gallop and no friction rub.   No murmur heard. Pulmonary/Chest: Effort normal and breath sounds normal. No respiratory distress. He has no wheezes. He has no rales. He exhibits no tenderness.  Abdominal: Soft. Bowel sounds are normal. He exhibits no distension and no mass. There is no tenderness. There is no rebound and no guarding.  Musculoskeletal: Normal range of motion. He exhibits no edema and no tenderness.       No calf tenderness or swelling  Neurological: He is alert and oriented to person, place, and time.  Skin: Skin is warm and dry. No rash noted. No erythema.  Psychiatric: He has a normal mood and affect. His behavior is normal.    ED Course  Procedures (including critical care time)  Labs Reviewed  COMPREHENSIVE METABOLIC PANEL - Abnormal; Notable for the following:    Glucose, Bld 112 (*)     All other components within normal limits  CBC WITH DIFFERENTIAL  TROPONIN I   Dg Chest 2 View  07/18/2012  *RADIOLOGY REPORT*  Clinical Data: Chest pain  CHEST - 2 VIEW  Comparison: 03/10/2012  Findings: Cardiomediastinal silhouette is stable.  No acute infiltrate or pleural effusion.  No pulmonary edema.  Bony thorax is stable. Coronary artery stent again noted .  IMPRESSION: No active disease.  No significant change.   Original Report Authenticated By: Natasha Mead, M.D.      1. Chest discomfort      Date: 07/18/2012  Rate: 72  Rhythm: normal sinus rhythm  QRS Axis: normal  Intervals: normal  ST/T Wave abnormalities: nonspecific T wave changes  Conduction Disutrbances:none  Narrative Interpretation:   Old EKG Reviewed: unchanged    MDM  Pt remains asymptomatic in ED. Trop is neg, normal EKG. Discussed with Dr Sharia Reeve. With negative trop and normal EKG believe pt can f/u in  office this week. Will call and have appointment  scheduled. Does not believe value in repeating trop given > 10 hour since on set of discomfort and neg initial trop. Discussed with pt who is in agreement with plan. i have advised him to return immediately for worsening symptoms or for any concerns. He voices understanding.         Loren Racer, MD 07/18/12 [phone]

## 2012-08-05 ENCOUNTER — Encounter (HOSPITAL_COMMUNITY): Payer: Self-pay | Admitting: Emergency Medicine

## 2012-08-05 ENCOUNTER — Emergency Department (HOSPITAL_COMMUNITY)
Admission: EM | Admit: 2012-08-05 | Discharge: 2012-08-05 | Disposition: A | Discharge status: Home / Self Care | Payer: Medicaid Other | Attending: Emergency Medicine | Admitting: Emergency Medicine

## 2012-08-05 DIAGNOSIS — Z5900 Homelessness unspecified: Secondary | ICD-10-CM | POA: Insufficient documentation

## 2012-08-05 DIAGNOSIS — I10 Essential (primary) hypertension: Secondary | ICD-10-CM | POA: Insufficient documentation

## 2012-08-05 DIAGNOSIS — Z7982 Long term (current) use of aspirin: Secondary | ICD-10-CM | POA: Insufficient documentation

## 2012-08-05 DIAGNOSIS — E7800 Pure hypercholesterolemia, unspecified: Secondary | ICD-10-CM | POA: Insufficient documentation

## 2012-08-05 DIAGNOSIS — S161XXA Strain of muscle, fascia and tendon at neck level, initial encounter: Secondary | ICD-10-CM

## 2012-08-05 DIAGNOSIS — Z9861 Coronary angioplasty status: Secondary | ICD-10-CM | POA: Insufficient documentation

## 2012-08-05 DIAGNOSIS — I209 Angina pectoris, unspecified: Secondary | ICD-10-CM | POA: Insufficient documentation

## 2012-08-05 DIAGNOSIS — M545 Low back pain, unspecified: Secondary | ICD-10-CM | POA: Insufficient documentation

## 2012-08-05 DIAGNOSIS — I2510 Atherosclerotic heart disease of native coronary artery without angina pectoris: Secondary | ICD-10-CM | POA: Insufficient documentation

## 2012-08-05 DIAGNOSIS — I252 Old myocardial infarction: Secondary | ICD-10-CM | POA: Insufficient documentation

## 2012-08-05 DIAGNOSIS — M542 Cervicalgia: Secondary | ICD-10-CM | POA: Insufficient documentation

## 2012-08-05 DIAGNOSIS — S0003XA Contusion of scalp, initial encounter: Secondary | ICD-10-CM

## 2012-08-05 DIAGNOSIS — Z79899 Other long term (current) drug therapy: Secondary | ICD-10-CM | POA: Insufficient documentation

## 2012-08-05 DIAGNOSIS — G8929 Other chronic pain: Secondary | ICD-10-CM | POA: Insufficient documentation

## 2012-08-05 DIAGNOSIS — S139XXA Sprain of joints and ligaments of unspecified parts of neck, initial encounter: Secondary | ICD-10-CM | POA: Insufficient documentation

## 2012-08-05 DIAGNOSIS — Z8719 Personal history of other diseases of the digestive system: Secondary | ICD-10-CM | POA: Insufficient documentation

## 2012-08-05 DIAGNOSIS — S0990XA Unspecified injury of head, initial encounter: Secondary | ICD-10-CM | POA: Insufficient documentation

## 2012-08-05 DIAGNOSIS — Z8739 Personal history of other diseases of the musculoskeletal system and connective tissue: Secondary | ICD-10-CM | POA: Insufficient documentation

## 2012-08-05 DIAGNOSIS — Z7902 Long term (current) use of antithrombotics/antiplatelets: Secondary | ICD-10-CM | POA: Insufficient documentation

## 2012-08-05 DIAGNOSIS — Z87891 Personal history of nicotine dependence: Secondary | ICD-10-CM | POA: Insufficient documentation

## 2012-08-05 DIAGNOSIS — R51 Headache: Secondary | ICD-10-CM | POA: Insufficient documentation

## 2012-08-05 DIAGNOSIS — E119 Type 2 diabetes mellitus without complications: Secondary | ICD-10-CM | POA: Insufficient documentation
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
## 2012-08-05 NOTE — Progress Notes (Signed)
Orthopedic Tech Progress Note Patient Details:  [name] [birth year] [id number]  Ortho Devices Type of Ortho Device: Soft collar Ortho Device/Splint Location: neck Ortho Device/Splint Interventions: Application   [name] 08/05/2012, 4:16 PM

## 2012-08-05 NOTE — ED Provider Notes (Signed)
  Medical screening examination/treatment/procedure(s) were performed by non-physician practitioner and as supervising physician I was immediately available for consultation/collaboration.    Gerhard Munch, MD 08/05/12 2351

## 2012-08-08 HISTORY — PX: OTHER SURGICAL HISTORY: SHX169

## 2012-11-11 ENCOUNTER — Telehealth: Payer: Self-pay | Admitting: *Deleted

## 2012-11-11 NOTE — Telephone Encounter (Signed)
livalo approved for 1 year. Approval #[id number]. Phone number called for [phone].

## 2012-12-01 ENCOUNTER — Telehealth: Payer: Self-pay | Admitting: *Deleted

## 2012-12-01 NOTE — Telephone Encounter (Signed)
Surgical clearance - ok by Dr. Smitty Pluck to hold Plavix for 7 days prior to procedure

## 2012-12-27 HISTORY — PX: COLONOSCOPY: SHX174

## 2013-01-20 ENCOUNTER — Ambulatory Visit (INDEPENDENT_AMBULATORY_CARE_PROVIDER_SITE_OTHER): Payer: Medicaid Other | Admitting: General Surgery

## 2013-01-20 ENCOUNTER — Encounter (INDEPENDENT_AMBULATORY_CARE_PROVIDER_SITE_OTHER): Payer: Self-pay | Admitting: General Surgery

## 2013-01-20 VITALS — BP 104/68 | HR 64 | Temp 97.5°F | Resp 16 | Ht 68.0 in | Wt 195.2 lb

## 2013-01-20 DIAGNOSIS — D1739 Benign lipomatous neoplasm of skin and subcutaneous tissue of other sites: Secondary | ICD-10-CM

## 2013-01-20 DIAGNOSIS — D170 Benign lipomatous neoplasm of skin and subcutaneous tissue of head, face and neck: Secondary | ICD-10-CM

## 2013-01-20 NOTE — Patient Instructions (Signed)
Lipoma A lipoma is a noncancerous (benign) tumor composed of fat cells. They are usually found under the skin (subcutaneous). A lipoma may occur in any tissue of the body that contains fat. Common areas for lipomas to appear include the back, shoulders, buttocks, and thighs. Lipomas are a very common soft tissue growth. They are soft and grow slowly. Most problems caused by a lipoma depend on where it is growing. DIAGNOSIS  A lipoma can be diagnosed with a physical exam. These tumors rarely become cancerous, but radiographic studies can help determine this for certain. Studies used may include:  Computerized X-ray scans (CT or CAT scan).  Computerized magnetic scans (MRI). TREATMENT  Small lipomas that are not causing problems may be watched. If a lipoma continues to enlarge or causes problems, removal is often the best treatment. Lipomas can also be removed to improve appearance. Surgery is done to remove the fatty cells and the surrounding capsule. Most often, this is done with medicine that numbs the area (local anesthetic). The removed tissue is examined under a microscope to make sure it is not cancerous. Keep all follow-up appointments with your caregiver. SEEK MEDICAL CARE IF:   The lipoma becomes larger or hard.  The lipoma becomes painful, red, or increasingly swollen. These could be signs of infection or a more serious condition. Document Released: 06/05/2002 Document Revised: 09/07/2011 Document Reviewed: 11/15/2009 [employer] Patient Information 2014 [employer].  

## 2013-01-20 NOTE — Progress Notes (Signed)
Patient ID: [name], male   DOB: [birth year], 52 y.o.   MRN: [id number]  Chief Complaint  Patient presents with  . New Evaluation    eval lipoma on back    HPI [name] is a 52 y.o. male.    HPI 52 yo AAM referred by Kenneth Scrape, FNP for evaluation of scalp cysts. The patient states that the pump on his right posterior scalp on the lateral aspect has been present for at least 4 years. Initially it was the size of a pea but it has steadily grown in size over the past years. He denies any trauma to area. He denies any redness, or drainage. He denies any night sweats, fever, chills, weight loss. He denies lymphadenopathy. It does cause some discomfort when he sleeps at night. He also has noticed a smaller bump just in front of the area. He denies any difficulty swallowing. He denies any hearing difficulties. He is no longer taking Plavix. Past Medical History  Diagnosis Date  . Coronary artery disease   . Hypertension   . Hypercholesterolemia   . Angina   . S/P angioplasty with stent, DES, to proximal and mid LCX 12/15/11 12/15/2011  . Myocardial infarction ~ 2010  . Type II diabetes mellitus   . GERD (gastroesophageal reflux disease)   . Arthritis     "lower back; shoulders; neck"  . Chronic lower back pain   . Angina at rest 03/10/2012    Past Surgical History  Procedure Laterality Date  . Cardiac catheterization    . Coronary angioplasty    . Coronary angioplasty with stent placement  2010 and 2011    x 2 cardaic stents  . Coronary angioplasty with stent placement  12/15/11    "2; total is now 4"  . Excisional hemorrhoidectomy  1984    Family History  Problem Relation Age of Onset  . Coronary artery disease Other   . Leukemia Mother   . Cancer Mother     leukemia  . Prostate cancer Father   . Cancer Father     prostate    Social History History  Substance Use Topics  . Smoking status: Former Smoker -- 1.00 packs/day for 10 years    Types: Cigarettes   Quit date: 04/07/2009  . Smokeless tobacco: Never Used  . Alcohol Use: Yes     Comment: 12/15/11 "til 2010, drank a pint of gin/day at least"    Allergies  Allergen Reactions  . Bee Venom Anaphylaxis and Hives  . Shellfish Allergy Anaphylaxis and Hives    Current Outpatient Prescriptions  Medication Sig Dispense Refill  . aspirin 325 MG EC tablet Take 325 mg by mouth every evening.       . carvedilol (COREG) 12.5 MG tablet Take 12.5 mg by mouth 2 (two) times daily with a meal.       . Coenzyme Q10 (CO Q 10) 100 MG CAPS Take 100 mg by mouth daily.       Marland Kitchen lisinopril (PRINIVIL,ZESTRIL) 5 MG tablet Take 5 mg by mouth daily.      . metFORMIN (GLUCOPHAGE) 1000 MG tablet Take 1,000 mg by mouth 2 (two) times daily with a meal.      . mirtazapine (REMERON) 30 MG tablet Take 30 mg by mouth at bedtime.      . nitroGLYCERIN (NITROSTAT) 0.4 MG SL tablet Place 0.4 mg under the tongue every 5 (five) minutes as needed. For chest pain      .  Omega-3 Fatty Acids (FISH OIL PO) Take 2,400 mg by mouth daily.       . traMADol (ULTRAM) 50 MG tablet Take 100 mg by mouth 2 (two) times daily as needed. For pain       No current facility-administered medications for this visit.    Review of Systems Review of Systems  Constitutional: Negative for fever, chills, appetite change and unexpected weight change.  HENT: Negative for congestion and trouble swallowing.   Eyes: Negative for visual disturbance.  Respiratory: Negative for chest tightness and shortness of breath.   Cardiovascular: Negative for chest pain and leg swelling.       No PND, no orthopnea, no DOE; history of cardiac stent. Cardiologist is Dr. Italy Hilty at [facility]  Gastrointestinal:       See HPI  Genitourinary: Negative for dysuria and hematuria.  Musculoskeletal: Negative.   Skin: Negative for rash.  Neurological: Negative for dizziness, seizures and speech difficulty.       Denies amaurosis fugax and TIA  Hematological: Does not  bruise/bleed easily.  Psychiatric/Behavioral: Negative for behavioral problems and confusion.    Blood pressure 104/68, pulse 64, temperature 97.5 F (36.4 C), temperature source Oral, resp. rate 16, height 5\' 8"  (1.727 m), weight 195 lb 3.2 oz (88.542 kg).  Physical Exam Physical Exam  Constitutional: He is oriented to person, place, and time. He appears well-developed and well-nourished. No distress.  HENT:  Head: Atraumatic.    Right Ear: External ear normal.  Left Ear: External ear normal.  Rt lateral scalp subcu masses; 5 x 5.5 cm; more anterior lesion is 1 x 1.5cm; well circumscribed. Soft, no overlying skin lesion, nontender, mobile; more anterior lesion is a little firmer  Eyes: Conjunctivae are normal. No scleral icterus.  Neck: Normal range of motion. Neck supple. No tracheal deviation present. No thyromegaly present.  Cardiovascular: Normal rate, normal heart sounds and intact distal pulses.   Pulmonary/Chest: Effort normal and breath sounds normal. No respiratory distress. He has no wheezes.  Abdominal: He exhibits no distension.  Musculoskeletal: Normal range of motion. He exhibits no edema and no tenderness.  Lymphadenopathy:       Head (right side): No submental, no submandibular, no preauricular, no posterior auricular and no occipital adenopathy present.       Head (left side): No submental, no submandibular, no preauricular, no posterior auricular and no occipital adenopathy present.    He has no cervical adenopathy.    He has no axillary adenopathy.       Right: No supraclavicular adenopathy present.       Left: No supraclavicular adenopathy present.  Neurological: He is alert and oriented to person, place, and time. He exhibits normal muscle tone.  Skin: Skin is warm and dry. No rash noted. He is not diaphoretic. No erythema. No pallor.  Psychiatric: He has a normal mood and affect. His behavior is normal. Judgment and thought content normal.    Data  Reviewed Note from Kenneth Scrape, FNP  Assessment    Scalp subcutaneous masses x 2     Plan    The areas on the scalp are consistent with either a lipoma or a pilar cyst.  Nonetheless both of these are benign conditions. The patient was given Agricultural engineer. We discussed observation versus surgical excision.   We discussed that the majority of lipomas are benign although on a rare occasion it can be malignant.   We discussed the risks and benefits of surgery including but not  limited to bleeding, infection, injury to surrounding structures, scarring, cosmetic concerns,, blood clot formation, anesthesia issues, cardiac complications, possible recurrence, and the typical postoperative course.   The patient has elected to To proceed to the operating room for excision of scalp subcutaneous masses x2  Mary Sella. Andrey Campanile, MD, FACS General, Bariatric, & Minimally Invasive Surgery [facility], [place]         [facility] 01/20/2013, 9:11 AM

## 2013-02-13 ENCOUNTER — Other Ambulatory Visit (INDEPENDENT_AMBULATORY_CARE_PROVIDER_SITE_OTHER): Payer: Self-pay | Admitting: General Surgery

## 2013-02-13 DIAGNOSIS — D1739 Benign lipomatous neoplasm of skin and subcutaneous tissue of other sites: Secondary | ICD-10-CM

## 2013-02-15 ENCOUNTER — Other Ambulatory Visit (INDEPENDENT_AMBULATORY_CARE_PROVIDER_SITE_OTHER): Payer: Self-pay | Admitting: *Deleted

## 2013-02-15 MED ORDER — OXYCODONE-ACETAMINOPHEN 5-325 MG PO TABS: 1.0000 | ORAL_TABLET | ORAL | Status: DC | PRN

## 2013-03-09 ENCOUNTER — Telehealth (INDEPENDENT_AMBULATORY_CARE_PROVIDER_SITE_OTHER): Payer: Self-pay | Admitting: General Surgery

## 2013-03-09 ENCOUNTER — Encounter (INDEPENDENT_AMBULATORY_CARE_PROVIDER_SITE_OTHER): Payer: Medicaid Other | Admitting: General Surgery

## 2013-03-09 NOTE — Telephone Encounter (Signed)
LMOM 9/11@1 :40 to call back

## 2013-03-09 NOTE — Telephone Encounter (Signed)
Message copied by June Leap on Thu Mar 09, 2013  1:42 PM ------      Message from: Zacarias Pontes      Created: Fri Mar 03, 2013  9:07 AM       Pt called to cx his 9/11 po apt with Dr. Migdalia Dk... I dont see a spot to r/s him, can you help me out please...thanks ------

## 2013-03-10 NOTE — Telephone Encounter (Signed)
Tried to R/S patients appt for sooner day but per pt's req he wanted to keep the 10/9 appt

## 2013-04-06 ENCOUNTER — Ambulatory Visit (INDEPENDENT_AMBULATORY_CARE_PROVIDER_SITE_OTHER): Payer: Medicaid Other | Admitting: General Surgery

## 2013-04-06 ENCOUNTER — Encounter (INDEPENDENT_AMBULATORY_CARE_PROVIDER_SITE_OTHER): Payer: Medicaid Other | Admitting: General Surgery

## 2013-04-06 ENCOUNTER — Encounter (INDEPENDENT_AMBULATORY_CARE_PROVIDER_SITE_OTHER): Payer: Self-pay | Admitting: General Surgery

## 2013-04-06 VITALS — BP 110/78 | HR 76 | Temp 97.5°F | Resp 16 | Ht 68.0 in | Wt 190.8 lb

## 2013-04-06 DIAGNOSIS — Z09 Encounter for follow-up examination after completed treatment for conditions other than malignant neoplasm: Secondary | ICD-10-CM

## 2013-04-06 NOTE — Patient Instructions (Signed)
Apply sunscreen or wear cap for next 3 months to minimize scar Can apply Vit E oil to help with scar

## 2013-04-06 NOTE — Progress Notes (Signed)
Subjective:     Patient ID: [name], male   DOB: 02-Jul-1960, 52 y.o.   MRN: [id number]  HPI 52 year old African American male comes in for followup after undergoing excision of right occipital scalp lipomas on August 18. He states that he has done well after surgery. However he states he has some numbness on his right side of his scalp. He denies any fevers, chills, headaches, vision changes, or hearing problems. He denies any drainage from the incision.  Review of Systems     Objective:   Physical Exam  Constitutional: He appears well-developed and well-nourished. No distress.  HENT:  Head: Atraumatic.    Right Ear: External ear normal.  Left Ear: External ear normal.  Skin: He is not diaphoretic.   BP 110/78  Pulse 76  Temp(Src) 97.5 F (36.4 C) (Temporal)  Resp 16  Ht 5\' 8"  (1.727 m)  Wt 190 lb 12.8 oz (86.546 kg)  BMI 29.02 kg/m2 Alert, no apparent distress     Assessment:     Status post excision of right occipital scalp lipomas     Plan:     We reviewed his pathology report. It confirmed benign lipomas. The larger more posterior lipoma was more densely attached to the skull base. And required more extensive dissection. This was explained to the patient. It appears that some of the cutaneous nerves were disrupted probably resulting in his numbness to the right side of the scalp. I explained that this is probably &may be permanent however there is a chance it may improve with time. With respect to scar improvement that the patient has about a told him to keep the area either covered with a hat or to wear sunscreen for the next several months. We also discussed the application of vitamin E oil. Followup as needed  Mary Sella. Andrey Campanile, MD, FACS General, Bariatric, & Minimally Invasive Surgery [facility], [place]

## 2013-05-11 ENCOUNTER — Encounter: Payer: Self-pay | Admitting: Neurology

## 2013-05-11 ENCOUNTER — Ambulatory Visit (INDEPENDENT_AMBULATORY_CARE_PROVIDER_SITE_OTHER): Payer: Medicaid Other | Admitting: Neurology

## 2013-05-11 VITALS — BP 102/68 | HR 56 | Temp 97.2°F | Ht 68.0 in | Wt 189.0 lb

## 2013-05-11 DIAGNOSIS — F1011 Alcohol abuse, in remission: Secondary | ICD-10-CM

## 2013-05-11 DIAGNOSIS — M722 Plantar fascial fibromatosis: Secondary | ICD-10-CM

## 2013-05-11 DIAGNOSIS — E119 Type 2 diabetes mellitus without complications: Secondary | ICD-10-CM

## 2013-05-11 DIAGNOSIS — G609 Hereditary and idiopathic neuropathy, unspecified: Secondary | ICD-10-CM

## 2013-05-11 DIAGNOSIS — F1411 Cocaine abuse, in remission: Secondary | ICD-10-CM

## 2013-05-11 MED ORDER — GABAPENTIN 100 MG PO CAPS: ORAL_CAPSULE | ORAL | Status: DC

## 2013-05-11 NOTE — Patient Instructions (Addendum)
You may have a condition called peripheral neuropathy, i. e. nerve damage. There is no specific treatment for most neuropathies. The most common cause for neuropathy is diabetes in this country, in which case, tight glucose control is key. Other causes include thyroid disease, and some vitamin deficiencies. Certain medications such as chemotherapy agents and other chemicals or toxins including alcohol can cause neuropathy. There are some genetic conditions or hereditary neuropathies. Typically patients will report a family history of neuropathy in those conditions. There are cases associated with cancers and autoimmune conditions. Most neuropathies are progressive unless a root cause can be found and treated. For most neuropathies there is no actual cure or reversing of symptoms. Painful neuropathy can be difficult to treat symptomatically, but there are some medications available to ease the symptoms. Electrophysiologic testing with nerve conduction velocity studies and EMG (muscle testing) do not always pick up neuropathies that affect the smallest fibers. Other common tests include different type of blood work, and rarely, spinal fluid testing, and sometimes we resort to asking for a nerve and muscle biopsy.  I would like to investigate things further to look for evidence of neuropathy or nerve damage; therefore, I would like to do some blood work, and electrical testing of your muscles and nerves, which is known as EMG/NCV. Neuropathy or nerve disease or damage can be caused by a variety of causes, most commonly diabetes, some toxins including alcohol or metabolic derangements or hereditary disorders.   Start Neurontin (gabapentin) 100 mg strength: Take 1 pill nightly at bedtime for 1 week, then 2 pills nightly for 1 week, then 3 pills each night thereafter. The most common side effects reported are sedation or sleepiness. Rare side effects include balance problems, confusion.

## 2013-05-11 NOTE — Progress Notes (Signed)
Subjective:    Patient ID: [name] is a 51 y.o. male.  HPI  Kenneth Foley, MD, PhD [facility] [address], [address] [address] [place], [place] [address]  Dear Dr. Fanny Dance,   I saw your patient, [name], upon your kind request in my neurologic clinic today for initial consultation of his neuropathy. The patient is unaccompanied today. As you know, [name] is a very pleasant 52 year old right-handed gentleman with an underlying medical history of chronic pain, hypertension, hyperlipidemia, type 2 diabetes, heart disease, status post PTCA as well as stent placement, who reports bilateral plantar fasciitis and had steroid injections in both feet, which did not help. He has had bilateral foot pain, L>R for the past 3 months. He was diagnosed with DM some 5 years ago, at the time of his MI. He also has a hx of EtOH abuse and Cocaine abuse and smoking, quit all of these some 4 years ago, after going through inpatient and outpatient rehab. He had no EMG/NCV, and had a L-spine MRI some 5 months through pain management. He sees the pain management doctor for chronic, diffuse pain, and is on tramadol 100 mg bid. He describes a constant, burning or shooting pain, primarily in his heels, no numbness, but does endorse paresthesias. He had a skin punch biopsy done on 03/30/2013 which showed no significant findings consistent with small fiber neuropathy but there were degenerative changes within the epidermal nerves which may be indicative of a future development of clinical neuropathy. He was advised to start taking gabapentin but he states that his pharmacy would not fill the prescription.  His Past Medical History Is Significant For: Past Medical History  Diagnosis Date  . Coronary artery disease   . Hypertension   . Hypercholesterolemia   . Angina   . S/P angioplasty with stent, DES, to proximal and mid LCX 12/15/11 12/15/2011  . Myocardial infarction ~ 2010  . Type II  diabetes mellitus   . GERD (gastroesophageal reflux disease)   . Arthritis     "lower back; shoulders; neck"  . Chronic lower back pain   . Angina at rest 03/10/2012    His Past Surgical History Is Significant For: Past Surgical History  Procedure Laterality Date  . Cardiac catheterization    . Coronary angioplasty    . Coronary angioplasty with stent placement  2010 and 2011    x 2 cardaic stents  . Coronary angioplasty with stent placement  12/15/11    "2; total is now 4"  . Excisional hemorrhoidectomy  1984  . Lipoma excision      back of the head    His Family History Is Significant For: Family History  Problem Relation Age of Onset  . Coronary artery disease Other   . Leukemia Mother   . Cancer Mother     leukemia  . Prostate cancer Father   . Cancer Father     prostate    His Social History Is Significant For: History   Social History  . Marital Status: Divorced    Spouse Name: N/A    Number of Children: N/A  . Years of Education: N/A   Social History Main Topics  . Smoking status: Former Smoker -- 1.00 packs/day for 10 years    Types: Cigarettes    Quit date: 04/07/2009  . Smokeless tobacco: Never Used  . Alcohol Use: No     Comment: 12/15/11 "til 2010, drank a pint of gin/day at least"  .  Drug Use: Yes    Special: Cocaine     Comment: 12/15/11 "last cocaine was 2010"  . Sexual Activity: Not Currently   Other Topics Concern  . None   Social History Narrative  . None    His Allergies Are:  Allergies  Allergen Reactions  . Bee Venom Anaphylaxis and Hives  . Shellfish Allergy Anaphylaxis and Hives  :   His Current Medications Are:  Outpatient Encounter Prescriptions as of 05/11/2013  Medication Sig  . aspirin 325 MG EC tablet Take 325 mg by mouth every evening.   . carvedilol (COREG) 12.5 MG tablet Take 12.5 mg by mouth 2 (two) times daily with a meal.   . Coenzyme Q10 (CO Q 10) 100 MG CAPS Take 100 mg by mouth daily.   . isosorbide  mononitrate (IMDUR) 60 MG 24 hr tablet Take 60 mg by mouth daily.  Marland Kitchen lisinopril (PRINIVIL,ZESTRIL) 5 MG tablet Take 5 mg by mouth daily.  . metFORMIN (GLUCOPHAGE) 1000 MG tablet Take 1,000 mg by mouth 2 (two) times daily with a meal.  . mirtazapine (REMERON) 30 MG tablet Take 30 mg by mouth at bedtime.  . nitroGLYCERIN (NITROSTAT) 0.4 MG SL tablet Place 0.4 mg under the tongue every 5 (five) minutes as needed. For chest pain  . Omega-3 Fatty Acids (FISH OIL PO) Take 2,400 mg by mouth daily.   Marland Kitchen testosterone cypionate (DEPOTESTOTERONE CYPIONATE) 200 MG/ML injection Inject 200 mg into the muscle every 28 (twenty-eight) days.  . traMADol (ULTRAM) 50 MG tablet Take 100 mg by mouth 2 (two) times daily as needed. For pain  . gabapentin (NEURONTIN) 100 MG capsule Take 1 pill nightly at bedtime for 1 week, then 2 pills nightly for 1 week, then 3 pills each night thereafter.   Review of Systems:  Out of a complete 14 point review of systems, all are reviewed and negative with the exception of these symptoms as listed below:   Review of Systems  Constitutional: Positive for fatigue.  HENT: Negative.   Eyes: Negative.   Respiratory:       Snoring  Cardiovascular: Negative.   Gastrointestinal: Negative.   Endocrine: Negative.   Genitourinary:       Impotence  Musculoskeletal: Positive for myalgias.  Skin: Negative.   Allergic/Immunologic: Negative.   Neurological: Positive for weakness and numbness.  Hematological: Negative.   Psychiatric/Behavioral: Negative.     Objective:  Neurologic Exam  Physical Exam Physical Examination:   Filed Vitals:   05/11/13 1406  BP: 102/68  Pulse: 56  Temp: 97.2 F (36.2 C)    General Examination: The patient is a very pleasant 53 y.o. male in no acute distress. He appears well-developed and well-nourished and well groomed.   HEENT: Normocephalic, atraumatic, pupils are equal, round and reactive to light and accommodation. Funduscopic exam is normal  with sharp disc margins noted. Extraocular tracking is good without limitation to gaze excursion or nystagmus noted. Normal smooth pursuit is noted. Hearing is grossly intact. Tympanic membranes are clear bilaterally. Face is symmetric with normal facial animation and normal facial sensation. Speech is clear with no dysarthria noted. There is no hypophonia. There is no lip, neck/head, jaw or voice tremor. Neck is supple with full range of passive and active motion. There are no carotid bruits on auscultation. Oropharynx exam reveals: mild mouth dryness, adequate dental hygiene and mild airway crowding, due to narrow airway entry. Mallampati is class II. Tongue protrudes centrally and palate elevates symmetrically.   Chest: Clear  to auscultation without wheezing, rhonchi or crackles noted.  Heart: S1+S2+0, regular and normal without murmurs, rubs or gallops noted.   Abdomen: Soft, non-tender and non-distended with normal bowel sounds appreciated on auscultation.  Extremities: There is no pitting edema in the distal lower extremities bilaterally. Pedal pulses are intact.  Skin: Warm and dry without trophic changes noted. There are no varicose veins.  Musculoskeletal: exam reveals no obvious joint deformities, tenderness or joint swelling or erythema.   Neurologically:  Mental status: The patient is awake, alert and oriented in all 4 spheres. His memory, attention, language and knowledge are appropriate. There is no aphasia, agnosia, apraxia or anomia. Speech is clear with normal prosody and enunciation. Thought process is linear. Mood is congruent and affect is normal.  Cranial nerves are as described above under HEENT exam. In addition, shoulder shrug is normal with equal shoulder height noted. Motor exam: Normal bulk, strength and tone is noted. There is no drift, tremor or rebound. Romberg is negative. Reflexes are 2+ throughout. Toes are downgoing bilaterally. Fine motor skills are intact with  normal finger taps, normal hand movements, normal rapid alternating patting, normal foot taps and normal foot agility.  Cerebellar testing shows no dysmetria or intention tremor on finger to nose testing. Heel to shin is unremarkable bilaterally. There is no truncal or gait ataxia.  Sensory exam is intact to light touch, pinprick, vibration, temperature sense and proprioception in the upper and lower extremities.  Gait, station and balance are unremarkable. No veering to one side is noted. No leaning to one side is noted. Posture is age-appropriate and stance is narrow based. No problems turning are noted. He turns en bloc. Tandem walk is unremarkable. Intact toe and heel stance is noted.            Assessment and Plan:   In summary, [name] is a very pleasant 52 y.o.-year old male with a history of bilateral foot pain left more so than right. This is confined most of the time to his heels. I cannot fully exclude that this is exclusive to plantar fasciitis. Nevertheless. He also describes paresthesias and certainly has at least 2 reasons to develop peripheral neuropathy including diabetes and his prior history of alcohol abuse. He is at this point advised that clinically I do not detect any nerve damage or numbness on exam but he certainly is at risk for having neuropathy. At this juncture I would like to proceed with further testing to include EMG and nerve conduction testing, and further blood work. We will look for treatable causes of neuropathy. In the interim, for symptomatic treatment, I suggested starting him on Neurontin. We talked about potential side effects. I explained to him that we will start low and slow. Even though the tramadol takes the edge off he states he still is quite bothered by the foot pain. To that end I provided him with a prescription for gabapentin. I will see him back in 3 months, sooner if the need arises. In the interim, we will call him with his blood work and EMG and  nerve conduction test results. He was in agreement. I answered all his questions today. States that his last hemoglobin A1c was about 6.3. He sees Ms. Windy Canny for his primary care needs.  Thank you very much for allowing me to participate in the care of this nice patient. If I can be of any further assistance to you please do not hesitate to call me at [phone].  Sincerely,   [name], MD, PhD

## 2013-05-26 LAB — CBC WITH DIFFERENTIAL
Basos: 0 %
Eos: 6 %
HCT: 42.5 % (ref 37.5–51.0)
Hemoglobin: 14.4 g/dL (ref 12.6–17.7)
Immature Granulocytes: 0 %
Lymphocytes Absolute: 2.3 10*3/uL (ref 0.7–3.1)
MCH: 28 pg (ref 26.6–33.0)
MCV: 83 fL (ref 79–97)
Monocytes Absolute: 0.4 10*3/uL (ref 0.1–0.9)
Monocytes: 8 %
Platelets: 243 10*3/uL (ref 150–379)
RBC: 5.15 x10E6/uL (ref 4.14–5.80)
WBC: 4.8 10*3/uL (ref 3.4–10.8)

## 2013-05-26 LAB — COMPREHENSIVE METABOLIC PANEL
ALT: 19 IU/L (ref 0–44)
Albumin: 4.5 g/dL (ref 3.5–5.5)
BUN: 12 mg/dL (ref 6–24)
Calcium: 9.8 mg/dL (ref 8.7–10.2)
Chloride: 99 mmol/L (ref 97–108)
GFR calc non Af Amer: 100 mL/min/{1.73_m2} (ref >59)
Glucose: 71 mg/dL (ref 65–99)
Potassium: 4.5 mmol/L (ref 3.5–5.2)
Total Protein: 6.5 g/dL (ref 6.0–8.5)

## 2013-05-26 LAB — HGB A1C W/O EAG: Hgb A1c MFr Bld: 6 % — ABNORMAL HIGH (ref 4.8–5.6)

## 2013-05-26 LAB — IFE AND PE, SERUM
Albumin/Glob SerPl: 1.9 (ref 0.7–2.0)
Globulin, Total: 2.3 g/dL (ref 2.0–4.5)
IgA/Immunoglobulin A, Serum: 90 mg/dL — ABNORMAL LOW (ref 91–414)
IgG (Immunoglobin G), Serum: 871 mg/dL (ref 700–1600)
IgM (Immunoglobulin M), Srm: 63 mg/dL (ref 40–230)

## 2013-05-26 LAB — ANA W/REFLEX: Anti Nuclear Antibody(ANA): NEGATIVE

## 2013-05-26 LAB — RPR: RPR: NONREACTIVE

## 2013-05-26 LAB — B12 AND FOLATE PANEL: Folate: 14 ng/mL (ref >3.0)

## 2013-05-29 ENCOUNTER — Encounter (INDEPENDENT_AMBULATORY_CARE_PROVIDER_SITE_OTHER): Payer: Self-pay

## 2013-05-29 ENCOUNTER — Ambulatory Visit (INDEPENDENT_AMBULATORY_CARE_PROVIDER_SITE_OTHER): Payer: Medicaid Other | Admitting: Diagnostic Neuroimaging

## 2013-05-29 DIAGNOSIS — G609 Hereditary and idiopathic neuropathy, unspecified: Secondary | ICD-10-CM

## 2013-05-29 DIAGNOSIS — R200 Anesthesia of skin: Secondary | ICD-10-CM

## 2013-05-29 DIAGNOSIS — Z0289 Encounter for other administrative examinations: Secondary | ICD-10-CM

## 2013-05-29 NOTE — Procedures (Signed)
   [facility]  NCS (NERVE CONDUCTION STUDY) WITH EMG (ELECTROMYOGRAPHY) REPORT   STUDY DATE: 05/29/13 PATIENT NAME: [name] DOB: [birth year] MRN: [id number]  ORDERING CLINICIAN: Huston Foley, MD PhD   TECHNOLOGIST: Gearldine Shown ELECTROMYOGRAPHER: Glenford Bayley. Davan Hark, MD  CLINICAL INFORMATION: 51 year old male with bilateral foot pain.  FINDINGS: NERVE CONDUCTION STUDY: Bilateral peroneal and tibial motor responses have normal distal latencies, amplitudes, conduction velocities and F-wave latencies. Bilateral peroneal sensory responses are normal. Bilateral medial and lateral plantar responses have normal latencies and decreased amplitudes.  NEEDLE ELECTROMYOGRAPHY: Needle examination of left lower extremity (vastus medialis, tibialis anterior, gastrocnemius) shows 1+ complex repetitive discharges, 1+ fibrillation potentials and 1+ positive sharp waves in the left gastrocnemius muscle at rest and normal motor unit recruitment on exertion. Left L5-S1 paraspinal muscles straight 1+ fibrillation potentials. Right L5-S1 paraspinal muscles are unremarkable.  IMPRESSION:  Mildly abnormal study demonstrating: 1. 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 2. No evidence of widespread large fiber neuropathy.   INTERPRETING PHYSICIAN:  Suanne Marker, MD Certified in Neurology, Neurophysiology and Neuroimaging  [facility] [address], [address] [place], [place] [address] [phone]

## 2013-05-29 NOTE — Progress Notes (Signed)
Quick Note:  Please call and advise the patient that the recent labs we checked were for the most part within normal limits. We checked vitamin D level, vitamin B12 level, cell count, blood sugar, diabetes marker, electrolytes, kidney function, liver function, thyroid function, inflammatory markers, an infectious marker, sedimentation rate as well as heavy metals. His diabetes marker was elevated at 6.0. This is called the hemoglobin A1c. This indicates that he is at risk for diabetes. This does not typically indicate frank diabetes. However, watching what he eats and proper exercise should help. Also, his vitamin D was on the lower side of the spectrum. For this it may be reasonable for him to start taking an over-the-counter vitamin D supplement. His B12 level was quite high, which typically indicates that he may be taking a vitamin B12 containing supplement. He may not need to take that much vitamin B12 if he is taking a supplement. No further action is required on these tests at this time. Please remind patient to keep any upcoming appointments or tests and to call us with any interim questions, concerns, problems or updates. Thanks,  Huston Foley, MD, PhD    ______

## 2013-05-30 ENCOUNTER — Encounter: Payer: Self-pay | Admitting: *Deleted

## 2013-08-08 ENCOUNTER — Telehealth: Payer: Self-pay | Admitting: *Deleted

## 2013-08-15 ENCOUNTER — Ambulatory Visit: Payer: Medicare Other | Admitting: Internal Medicine

## 2013-08-27 ENCOUNTER — Encounter: Payer: Self-pay | Admitting: *Deleted

## 2013-08-28 ENCOUNTER — Encounter: Payer: Self-pay | Admitting: Internal Medicine

## 2013-08-28 ENCOUNTER — Ambulatory Visit (INDEPENDENT_AMBULATORY_CARE_PROVIDER_SITE_OTHER): Payer: Medicaid Other | Admitting: Internal Medicine

## 2013-08-28 VITALS — BP 110/88 | HR 68 | Ht 68.0 in | Wt 186.2 lb

## 2013-08-28 DIAGNOSIS — I209 Angina pectoris, unspecified: Secondary | ICD-10-CM

## 2013-08-28 DIAGNOSIS — E785 Hyperlipidemia, unspecified: Secondary | ICD-10-CM

## 2013-08-28 DIAGNOSIS — I2510 Atherosclerotic heart disease of native coronary artery without angina pectoris: Secondary | ICD-10-CM

## 2013-08-28 DIAGNOSIS — I10 Essential (primary) hypertension: Secondary | ICD-10-CM

## 2013-08-28 DIAGNOSIS — E119 Type 2 diabetes mellitus without complications: Secondary | ICD-10-CM

## 2013-08-28 DIAGNOSIS — I208 Other forms of angina pectoris: Secondary | ICD-10-CM

## 2013-08-28 MED ORDER — ISOSORBIDE MONONITRATE ER 30 MG PO TB24: 30.0000 mg | ORAL_TABLET | Freq: Every day | ORAL | Status: DC

## 2013-08-28 MED ORDER — NITROGLYCERIN 0.4 MG SL SUBL: 0.4000 mg | SUBLINGUAL_TABLET | SUBLINGUAL | Status: DC | PRN

## 2013-08-28 NOTE — Progress Notes (Signed)
OFFICE NOTE  Chief Complaint:  Occasional angina  Primary Care Physician: [name], FNP  HPI:  [name] a 53 year old gentleman who has a history of coronary disease, hypertension, dyslipidemia, diabetes type 2, and microvascular angina. He has had multiple PCIs, in-stent restenosis, but has been actually fairly stable since his last drug-eluting stent placement in June 2013. He had a nuclear stress test which showed some mild abnormalities and was considered low risk and has been treated medically.  He had chest pain in January 2014 and was seen at [facility]; however, no intervention was performed at that time. Recently he had some dyspepsia treated by a PPI which has resolved. He has had a little bit of weight gain and decreased exercise, which he is working on, and some dietary indiscretion. He also had a metabolic test in our office just on August 08, 2012, which showed good exercise effort, peak VO2 of 79% predicted with normal VO2 heart-rate curves indicating only mild deconditioning but no evidence of ischemia.   Since his last office visit he is adopted a vegetarian lifestyle and has managed to lose almost 20 pounds. He continues to exercise and his made great strides as far as his symptoms are concerned. He still has occasional episodes of angina which are nitrate responsive. He is concerned however about taking higher doses of Imdur as he read that it may not be effective in the long term.  He is inquiring about possibly coming off the medication if possible. He is no longer taking little low as he has made dietary changes and wishes to use a natural approach rather than statin medication.  I've reminded him that because he has significant coronary disease and multiple stents that statins are still indicated despite dietary changes.  PMHx:  Past Medical History  Diagnosis Date  . Coronary artery disease   . Hypertension   . Dyslipidemia     statin-intolerance   .  Angina   . S/P angioplasty with stent, DES, to proximal and mid LCX 12/15/11 12/15/2011  . Myocardial infarction ~ 2010  . Type II diabetes mellitus   . GERD (gastroesophageal reflux disease)   . Arthritis     "lower back; shoulders; neck"  . Chronic lower back pain   . 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 Excisional hemorrhoidectomy  1984  . Lipoma excision      back of the head  . Cardiometabolic testing  09/29/4740    good exercise effort, peak VO2 79% predicted with normal VO2 HR curves (mild deconditioning)  . Nm myocar perf wall motion  02/2012    lexiscan myoview; mild perfusion defect in mid inferolateral & basal inferolateral region (infarct/scar); EF 52%, abnormal but ow risk scan  . Cardiac catheterization  06/15/2002    LAD with prox 40% stenosis, norma L main, Cfx with 25% lesion, RCA with long mid 25% stenosis (Dr. Vita Barley)  . Cardiac catheterization  04/01/2010    normal L main, LAD wit mild stenosis, L Cfx with 70% in-stent restenosis, RCA with 70% in-stent restenosis, LVEF >60% (Dr. K. Mali Jayvier Burgher) - cutting ballon arthrectomy to RCA & Cfx (Dr. Rockne Menghini)  . Cardiac catheterization  08/25/2010    preserved global LV contractility; multivessel CAD, diffuse 90-95% in-stent restenosis in prox placed Cfx stent - cutting balloon arthrectomy in Cfx with multiple dilatations 90-95% to 0% stenosis (Dr. Corky Downs)  .  Cardiac catheterization  01/26/2011    PCI & stenting of aggresive in-stent restenosis within previously stented AV groove Cfx with 3.0x40mm Taxus DES (previous stents were Promus) (Dr. Adora Fridge)  . Cardiac catheterization  05/11/2011    preserved LV function, 40% mid LAD stenosis, 30-40% narrowing proximal to stented semgnet of prox Cfx, patent mid RCA stent with smooth 20% narrowing in distal RCA (Dr. Corky Downs)  . Cardiac catheterization  12/15/2011    PCI & stenting of proximal & mid Cfx with DES -  3.0x45mm in proximal, 3.0x83mm in mid (Dr. Adora Fridge)    FAMHx:  Family History  Problem Relation Age of Onset  . Coronary artery disease Paternal Grandmother   . Leukemia Mother   . Prostate cancer Father   . Cancer Paternal Grandfather   . Cancer Brother     SOCHx:   reports that he quit smoking about 4 years ago. His smoking use included Cigarettes. He has a 10 pack-year smoking history. He has never used smokeless tobacco. He reports that he uses illicit drugs (Cocaine). He reports that he does not drink alcohol.  ALLERGIES:  Allergies  Allergen Reactions  . Bee Venom Anaphylaxis and Hives  . Shellfish Allergy Anaphylaxis and Hives    ROS: A comprehensive review of systems was negative except for: Cardiovascular: positive for chest pain  HOME MEDS: Current Outpatient Prescriptions  Medication Sig Dispense Refill  . aspirin 325 MG EC tablet Take 325 mg by mouth every evening.       . carvedilol (COREG) 12.5 MG tablet Take 12.5 mg by mouth 2 (two) times daily with a meal.       . Coenzyme Q10 (CO Q 10) 100 MG CAPS Take 100 mg by mouth daily.       Marland Kitchen gabapentin (NEURONTIN) 100 MG capsule Take 300 mg by mouth at bedtime. Take 1 pill nightly at bedtime for 1 week, then 2 pills nightly for 1 week, then 3 pills each night thereafter.      . isosorbide mononitrate (IMDUR) 30 MG 24 hr tablet Take 1 tablet (30 mg total) by mouth daily.  90 tablet  3  . lisinopril (PRINIVIL,ZESTRIL) 5 MG tablet Take 5 mg by mouth daily.      . metFORMIN (GLUCOPHAGE) 1000 MG tablet Take 1,000 mg by mouth 2 (two) times daily with a meal.      . mirtazapine (REMERON) 30 MG tablet Take 30 mg by mouth at bedtime.      . nitroGLYCERIN (NITROSTAT) 0.4 MG SL tablet Place 1 tablet (0.4 mg total) under the tongue every 5 (five) minutes as needed. For chest pain  25 tablet  3  . Omega-3 Fatty Acids (FISH OIL PO) Take 2,400 mg by mouth daily.       Marland Kitchen testosterone cypionate (DEPOTESTOTERONE CYPIONATE) 200 MG/ML  injection Inject 200 mg into the muscle every 28 (twenty-eight) days.      . traMADol (ULTRAM) 50 MG tablet Take 100 mg by mouth 2 (two) times daily as needed. For pain       No current facility-administered medications for this visit.    LABS/IMAGING: No results found for this or any previous visit (from the past 48 hour(s)). No results found.  VITALS: BP 110/88  Pulse 68  Ht 5\' 8"  (1.727 m)  Wt 186 lb 3.2 oz (84.46 kg)  BMI 28.32 kg/m2  EXAM: General appearance: alert and no distress Neck: no carotid bruit and no JVD Lungs: clear to auscultation  bilaterally Heart: regular rate and rhythm, S1, S2 normal, no murmur, click, rub or gallop Abdomen: soft, non-tender; bowel sounds normal; no masses,  no organomegaly Extremities: extremities normal, atraumatic, no cyanosis or edema Pulses: 2+ and symmetric Skin: Skin color, texture, turgor normal. No rashes or lesions Neurologic: Grossly normal Psych: Mood, affect normal  EKG: Normal sinus rhythm at 68  ASSESSMENT: 1. Coronary artery disease status post multiple PCI as with the last stent to the circumflex in 2013 2. Dyslipidemia-statin intolerant 3. Hypertension 4. Diabetes type 2 5. Stable angina  PLAN: 1.   [name] should be commended for his recent weight loss, increased exercise and switch to a vegetarian diet. He reports only infrequent episodes of angina and is interested in coming down on his medications. I do think is reasonable to try to decrease his Imdur to avoid tachyphylaxis.  However recommend decreasing it to 30 mg daily and have provided him with sublingual nitroglycerin glycerin to take if he has breakthrough angina. His blood pressure is well-controlled today and his weight is at goal.  He should continue his current medications we'll plan to see him back in 6 months.  Pixie Casino, MD, [facility] Attending Cardiologist [facility]  Elandra Powell C 08/28/2013, 5:06 PM

## 2013-08-28 NOTE — Patient Instructions (Signed)
Your physician has recommended you make the following change in your medication: DECREASE isosorbide to 30mg  once daily. You can cut your 60mg  tablets in half.   Your physician wants you to follow-up in: 6 months. You will receive a reminder letter in the mail two months in advance. If you don't receive a letter, please call our office to schedule the follow-up appointment.

## 2013-09-13 ENCOUNTER — Telehealth: Payer: Self-pay | Admitting: Internal Medicine

## 2013-09-13 MED ORDER — ISOSORBIDE MONONITRATE ER 30 MG PO TB24: 90.0000 mg | ORAL_TABLET | Freq: Every day | ORAL | Status: DC

## 2013-09-13 NOTE — Telephone Encounter (Signed)
Ok .. Have him go up to 90.  If his symptoms do not improve, have him make an appointment to see me.  -Dr. Debara Pickett

## 2013-09-13 NOTE — Telephone Encounter (Signed)
Returned call and informed pt per instructions by MD.  Pt verbalized understanding and agreed w/ plan.  Pt asked for script to be sent to pharmacy for 90-day supply and pt prefers taking (3) 30 mg tabs daily.  Rx sent to pharmacy.

## 2013-09-13 NOTE — Telephone Encounter (Signed)
Dr Debara Pickett changed his Isosorbide prescription  to 30 mg. This is not working,he needs him to change it.

## 2013-09-13 NOTE — Telephone Encounter (Signed)
Returned call and pt verified x 2. Stated isosorbide was changed from 60 mg to 30 mg, but he requested it change from 60 to higher dose and Dr. Debara Pickett explained why he didn't increase it.  Pt stated 30 mg is not working and he wants to increase to the next dose above 60 mg, if it's okay w/ MD.  Pt c/o more frequent angina.  Admits to taking NTG, but not on a daily basis.  Stated he was taking it 1-2 times daily the first week after the dose first changed.  Pt informed Dr. Debara Pickett will be notified for further instructions and he will be notified of response.  Pt verbalized understanding and agreed w/ plan.  Message forwarded to Dr. Debara Pickett.

## 2013-09-14 ENCOUNTER — Ambulatory Visit: Payer: Medicare Other | Admitting: Internal Medicine

## 2013-09-21 ENCOUNTER — Ambulatory Visit (INDEPENDENT_AMBULATORY_CARE_PROVIDER_SITE_OTHER): Payer: Medicaid Other | Admitting: Neurology

## 2013-09-21 ENCOUNTER — Encounter: Payer: Self-pay | Admitting: Neurology

## 2013-09-21 VITALS — BP 123/79 | HR 72 | Temp 97.0°F | Ht 68.0 in | Wt 185.0 lb

## 2013-09-21 DIAGNOSIS — IMO0002 Reserved for concepts with insufficient information to code with codable children: Secondary | ICD-10-CM

## 2013-09-21 DIAGNOSIS — G609 Hereditary and idiopathic neuropathy, unspecified: Secondary | ICD-10-CM

## 2013-09-21 DIAGNOSIS — M545 Low back pain, unspecified: Secondary | ICD-10-CM

## 2013-09-21 DIAGNOSIS — E119 Type 2 diabetes mellitus without complications: Secondary | ICD-10-CM

## 2013-09-21 DIAGNOSIS — IMO0001 Reserved for inherently not codable concepts without codable children: Secondary | ICD-10-CM

## 2013-09-21 DIAGNOSIS — F1411 Cocaine abuse, in remission: Secondary | ICD-10-CM

## 2013-09-21 DIAGNOSIS — F1011 Alcohol abuse, in remission: Secondary | ICD-10-CM

## 2013-09-21 MED ORDER — GABAPENTIN 100 MG PO CAPS: 300.0000 mg | ORAL_CAPSULE | Freq: Every day | ORAL | Status: DC

## 2013-09-21 NOTE — Patient Instructions (Signed)
We will do a lumbar spine MRI to investigate your worsening back pain. We may consider a referral to orthopedics after the MRI is done; we will call you with the test result.  Continue your gabapentin for suspected diabetic neuropathy.
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
## 2013-10-30 NOTE — Telephone Encounter (Signed)
Encounter Closed---10/30/13 TP 

## 2013-11-09 ENCOUNTER — Other Ambulatory Visit: Payer: Self-pay | Admitting: Internal Medicine

## 2013-11-09 NOTE — Telephone Encounter (Signed)
Rx was sent to pharmacy electronically. 

## 2013-12-19 ENCOUNTER — Emergency Department (HOSPITAL_COMMUNITY): Payer: Medicaid Other

## 2013-12-19 ENCOUNTER — Emergency Department (HOSPITAL_COMMUNITY)
Admission: EM | Admit: 2013-12-19 | Discharge: 2013-12-19 | Disposition: A | Discharge status: Home / Self Care | Payer: Medicaid Other | Attending: Emergency Medicine | Admitting: Emergency Medicine

## 2013-12-19 ENCOUNTER — Encounter (HOSPITAL_COMMUNITY): Payer: Self-pay | Admitting: Emergency Medicine

## 2013-12-19 DIAGNOSIS — Z87891 Personal history of nicotine dependence: Secondary | ICD-10-CM | POA: Insufficient documentation

## 2013-12-19 DIAGNOSIS — Z7982 Long term (current) use of aspirin: Secondary | ICD-10-CM | POA: Diagnosis not present

## 2013-12-19 DIAGNOSIS — Z9889 Other specified postprocedural states: Secondary | ICD-10-CM | POA: Insufficient documentation

## 2013-12-19 DIAGNOSIS — G8929 Other chronic pain: Secondary | ICD-10-CM | POA: Insufficient documentation

## 2013-12-19 DIAGNOSIS — Z792 Long term (current) use of antibiotics: Secondary | ICD-10-CM | POA: Diagnosis not present

## 2013-12-19 DIAGNOSIS — E119 Type 2 diabetes mellitus without complications: Secondary | ICD-10-CM | POA: Insufficient documentation

## 2013-12-19 DIAGNOSIS — M129 Arthropathy, unspecified: Secondary | ICD-10-CM | POA: Insufficient documentation

## 2013-12-19 DIAGNOSIS — Z9861 Coronary angioplasty status: Secondary | ICD-10-CM | POA: Insufficient documentation

## 2013-12-19 DIAGNOSIS — I10 Essential (primary) hypertension: Secondary | ICD-10-CM | POA: Diagnosis not present

## 2013-12-19 DIAGNOSIS — N509 Disorder of male genital organs, unspecified: Secondary | ICD-10-CM | POA: Diagnosis not present

## 2013-12-19 DIAGNOSIS — I252 Old myocardial infarction: Secondary | ICD-10-CM | POA: Insufficient documentation

## 2013-12-19 DIAGNOSIS — I2510 Atherosclerotic heart disease of native coronary artery without angina pectoris: Secondary | ICD-10-CM | POA: Diagnosis not present

## 2013-12-19 DIAGNOSIS — Z79899 Other long term (current) drug therapy: Secondary | ICD-10-CM | POA: Diagnosis not present

## 2013-12-19 DIAGNOSIS — I861 Scrotal varices: Secondary | ICD-10-CM | POA: Insufficient documentation

## 2013-12-19 DIAGNOSIS — K219 Gastro-esophageal reflux disease without esophagitis: Secondary | ICD-10-CM | POA: Insufficient documentation

## 2013-12-19 LAB — URINALYSIS, ROUTINE W REFLEX MICROSCOPIC
Bilirubin Urine: NEGATIVE
Glucose, UA: NEGATIVE mg/dL
HGB URINE DIPSTICK: NEGATIVE
Ketones, ur: NEGATIVE mg/dL
Leukocytes, UA: NEGATIVE
Nitrite: NEGATIVE
PH: 6.5 (ref 5.0–8.0)
Protein, ur: NEGATIVE mg/dL
SPECIFIC GRAVITY, URINE: 1.02 (ref 1.005–1.030)
UROBILINOGEN UA: 1 mg/dL (ref 0.0–1.0)

## 2013-12-19 MED ORDER — OXYCODONE-ACETAMINOPHEN 7.5-325 MG PO TABS: 1.0000 | ORAL_TABLET | ORAL | Status: DC | PRN

## 2013-12-19 NOTE — ED Notes (Signed)
Pt states inguinal hernia right side that has been there for 3 weeks. Pt received pain medication with no relief in symptoms. Pt states increased pain during ambulation.

## 2013-12-19 NOTE — Discharge Instructions (Signed)
Scrotal Masses Scrotal swelling is common in men of all ages. Common types of testicular masses include:   Hydrocele. The most common benign testicular mass in an adult. Hydroceles are generally soft and painless collections of fluid in the scrotal sac. These can rapidly change size as the fluid enters or leaves. Hydroceles can be associated with an underlying cancer of the testicle.  Spermatoceles. Generally soft and painless cyst-like masses in the scrotum that contain fluid, usually above the testicle. They can rapidly change size as the fluid enters or leaves. They are more prominent while standing or exercising. Sometimes, spermatoceles may cause a sensation of heaviness or a dull ache.  Orchitis. Inflammation of the testicle. It is painful and may be associated with a fever or symptoms of a urinary tract infection, including frequent and painful urination. It is common in males who have the mumps.  Varicocele. An enlargement of the veins that drain the testicles. Varicoceles usually occur on the left side of the scrotum. This condition can increase the risk of infertility. Varicocele is sometimes more prominent while standing or exercising. Sometimes, varicoceles may cause a sensation of heaviness or a dull ache.  Inguinal hernia. A bulge caused by a portion of intestine protruding into the scrotum through a weak area in the abdominal muscles. Hernias may or may not be painful. They are soft and usually enlarge with coughing or straining.  Torsion of the testis. This can cause a testicular mass that develops quickly and is associated with tenderness or fever, or both. It is caused by a twisting of the testicle within the sac. It also reduces the blood supply and can destroy the testis if not treated quickly with surgery.  Epididymitis. Inflammation of the epididymis (a structure attached above and behind the testicle), usually caused by a urinary tract infection or a sexually transmitted  infection. This generally shows up as testicular discomfort and swelling and may include pain during urination. It is frequently associated with a testicle infection.  Testicular appendages. Remnants of tissue on the testis present since birth. A testicular appendage can twist on its blood supply and cause pain. In most cases, this is seen as a blue dot on the scrotum.  Hematocele. A collection of blood between the layers of the sac inside the scrotum. It usually is caused by trauma to the scrotum.  Sebaceous cysts. These can be a swelling in the skin of the scrotum and are usually painless.  Cancer (carcinoma) of the skin of the scrotum. It can cause scrotal swelling, but this is rare. Document Released: 12/20/2002 Document Revised: 02/15/2013 Document Reviewed: 12/05/2012 Exit[employer] Patient Information 2015 [employer]. This information is not intended to replace advice given to you by your health care provider. Make sure you discuss any questions you have with your health care provider.  

## 2014-01-11 NOTE — ED Provider Notes (Signed)
CSN: [id number]     Arrival date & time 12/19/13  6269 History   First MD Initiated Contact with Patient 12/19/13 0719     Chief Complaint  Patient presents with  . Inguinal Hernia      HPI Pt states inguinal hernia right side that has been there for 3 weeks. Pt received pain medication with no relief in symptoms. Pt states increased pain during ambulation.   Past Medical History  Diagnosis Date  . Coronary artery disease   . Hypertension   . Dyslipidemia     statin-intolerance   . Angina   . S/P angioplasty with stent, DES, to proximal and mid LCX 12/15/11 12/15/2011  . Myocardial infarction ~ 2010  . Type II diabetes mellitus   . GERD (gastroesophageal reflux disease)   . Arthritis     "lower back; shoulders; neck"  . Chronic lower back pain   . 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 Excisional hemorrhoidectomy  1984  . Lipoma excision      back of the head  . Cardiometabolic testing  4/85/4627    good exercise effort, peak VO2 79% predicted with normal VO2 HR curves (mild deconditioning)  . Nm myocar perf wall motion  02/2012    lexiscan myoview; mild perfusion defect in mid inferolateral & basal inferolateral region (infarct/scar); EF 52%, abnormal but ow risk scan  . Cardiac catheterization  06/15/2002    LAD with prox 40% stenosis, norma L main, Cfx with 25% lesion, RCA with long mid 25% stenosis (Dr. Vita Barley)  . Cardiac catheterization  04/01/2010    normal L main, LAD wit mild stenosis, L Cfx with 70% in-stent restenosis, RCA with 70% in-stent restenosis, LVEF >60% (Dr. K. Mali Hilty) - cutting ballon arthrectomy to RCA & Cfx (Dr. Rockne Menghini)  . Cardiac catheterization  08/25/2010    preserved global LV contractility; multivessel CAD, diffuse 90-95% in-stent restenosis in prox placed Cfx stent - cutting balloon arthrectomy in Cfx with multiple dilatations 90-95% to 0% stenosis (Dr. Corky Downs)  . Cardiac  catheterization  01/26/2011    PCI & stenting of aggresive in-stent restenosis within previously stented AV groove Cfx with 3.0x14mm Taxus DES (previous stents were Promus) (Dr. Adora Fridge)  . Cardiac catheterization  05/11/2011    preserved LV function, 40% mid LAD stenosis, 30-40% narrowing proximal to stented semgnet of prox Cfx, patent mid RCA stent with smooth 20% narrowing in distal RCA (Dr. Corky Downs)  . Cardiac catheterization  12/15/2011    PCI & stenting of proximal & mid Cfx with DES - 3.0x84mm in proximal, 3.0x53mm in mid (Dr. Adora Fridge)   Family History  Problem Relation Age of Onset  . Coronary artery disease Paternal Grandmother   . Leukemia Mother   . Prostate cancer Father   . Cancer Paternal Grandfather   . Cancer Brother    History  Substance Use Topics  . Smoking status: Former Smoker -- 1.00 packs/day for 10 years    Types: Cigarettes    Quit date: 04/07/2009  . Smokeless tobacco: Never Used  . Alcohol Use: No     Comment: 12/15/11 "til 2010, drank a pint of gin/day at least"    Review of Systems  All other systems reviewed and are negative.     Allergies  Bee venom and Shellfish allergy  Home Medications   Prior to Admission medications   Medication Sig Start  Date End Date Taking? Authorizing Provider  aspirin 325 MG EC tablet Take 325 mg by mouth every evening.    Yes Historical Provider, MD  carvedilol (COREG) 12.5 MG tablet Take 12.5 mg by mouth 2 (two) times daily with a meal.   Yes Historical Provider, MD  ciprofloxacin (CIPRO) 500 MG tablet Take 500 mg by mouth 2 (two) times daily.   Yes Historical Provider, MD  gabapentin (NEURONTIN) 300 MG capsule Take 300 mg by mouth at bedtime.   Yes Historical Provider, MD  isosorbide mononitrate (IMDUR) 60 MG 24 hr tablet Take 60 mg by mouth daily.   Yes Historical Provider, MD  lisinopril (PRINIVIL,ZESTRIL) 5 MG tablet Take 5 mg by mouth daily.   Yes Historical Provider, MD  metFORMIN (GLUCOPHAGE) 1000 MG tablet  Take 1,000 mg by mouth 2 (two) times daily with a meal.   Yes Historical Provider, MD  mirtazapine (REMERON) 30 MG tablet Take 30 mg by mouth at bedtime.   Yes Historical Provider, MD  nitroGLYCERIN (NITROSTAT) 0.4 MG SL tablet Place 1 tablet (0.4 mg total) under the tongue every 5 (five) minutes as needed. For chest pain 08/28/13  Yes Pixie Casino, MD  Omega-3 Fatty Acids (FISH OIL PO) Take 1,200 mg by mouth daily.    Yes Historical Provider, MD  testosterone cypionate (DEPOTESTOTERONE CYPIONATE) 200 MG/ML injection Inject 200 mg into the muscle every 28 (twenty-eight) days.   Yes Historical Provider, MD  traMADol (ULTRAM) 50 MG tablet Take 50 mg by mouth 2 (two) times daily. For pain   Yes Historical Provider, MD  oxyCODONE-acetaminophen (PERCOCET) 7.5-325 MG per tablet Take 1 tablet by mouth every 4 (four) hours as needed for pain. 12/19/13   Dot Lanes, MD   BP 101/70  Pulse 62  Temp(Src) 97.4 F (36.3 C) (Oral)  Resp 18  Ht 5\' 8"  (1.727 m)  Wt 180 lb (81.647 kg)  BMI 27.38 kg/m2  SpO2 97% Physical Exam  Nursing note and vitals reviewed. Constitutional: He is oriented to person, place, and time. He appears well-developed and well-nourished. No distress.  HENT:  Head: Normocephalic and atraumatic.  Eyes: Pupils are equal, round, and reactive to light.  Neck: Normal range of motion.  Cardiovascular: Normal rate and intact distal pulses.   Pulmonary/Chest: No respiratory distress.  Abdominal: Normal appearance. He exhibits no distension. Hernia confirmed negative in the right inguinal area and confirmed negative in the left inguinal area.  Genitourinary: Penis normal. Right testis shows tenderness. Right testis shows no swelling. Right testis is descended.  Musculoskeletal: Normal range of motion.  Lymphadenopathy:       Right: No inguinal adenopathy present.       Left: No inguinal adenopathy present.  Neurological: He is alert and oriented to person, place, and time. No  cranial nerve deficit.  Skin: Skin is warm and dry. No rash noted.  Psychiatric: He has a normal mood and affect. His behavior is normal.    ED Course  Procedures (including critical care time) Labs Review Labs Reviewed  URINALYSIS, ROUTINE W REFLEX MICROSCOPIC    Imaging Review No results found.  No incarcerated hernia noted.  Varicocele discovered.  Discussed with patient the need for follow up with urologist.  He stated he would comply.  Will give RX for pain medication. Stable for discharge  MDM   Final diagnoses:  Varicocele        Dot Lanes, MD 01/11/14 1137

## 2014-02-15 ENCOUNTER — Encounter: Payer: Self-pay | Admitting: Internal Medicine

## 2014-02-15 ENCOUNTER — Ambulatory Visit (INDEPENDENT_AMBULATORY_CARE_PROVIDER_SITE_OTHER): Payer: Medicaid Other | Admitting: Internal Medicine

## 2014-02-15 VITALS — BP 124/90 | HR 62 | Ht 68.0 in | Wt 185.2 lb

## 2014-02-15 DIAGNOSIS — E119 Type 2 diabetes mellitus without complications: Secondary | ICD-10-CM

## 2014-02-15 DIAGNOSIS — E785 Hyperlipidemia, unspecified: Secondary | ICD-10-CM

## 2014-02-15 DIAGNOSIS — N528 Other male erectile dysfunction: Secondary | ICD-10-CM

## 2014-02-15 DIAGNOSIS — I10 Essential (primary) hypertension: Secondary | ICD-10-CM

## 2014-02-15 DIAGNOSIS — N529 Male erectile dysfunction, unspecified: Secondary | ICD-10-CM | POA: Insufficient documentation

## 2014-02-15 DIAGNOSIS — I2510 Atherosclerotic heart disease of native coronary artery without angina pectoris: Secondary | ICD-10-CM

## 2014-02-15 MED ORDER — SILDENAFIL CITRATE 50 MG PO TABS: 50.0000 mg | ORAL_TABLET | Freq: Every day | ORAL | Status: DC | PRN

## 2014-02-15 MED ORDER — SILDENAFIL CITRATE 20 MG PO TABS: 40.0000 mg | ORAL_TABLET | Freq: Every day | ORAL | Status: DC | PRN

## 2014-02-15 NOTE — Patient Instructions (Signed)
Your physician recommends that you return for lab work in: TODAY  Your physician wants you to follow-up in: 1 year. You will receive a reminder letter in the mail two months in advance. If you don't receive a letter, please call our office to schedule the follow-up appointment.

## 2014-02-15 NOTE — Progress Notes (Signed)
OFFICE NOTE  Chief Complaint:  No complaints  Primary Care Physician: [name], FNP  HPI:  [name] a 53 year old gentleman who has a history of coronary disease, hypertension, dyslipidemia, diabetes type 2, and microvascular angina. He has had multiple PCIs, in-stent restenosis, but has been actually fairly stable since his last drug-eluting stent placement in June 2013. He had a nuclear stress test which showed some mild abnormalities and was considered low risk and has been treated medically.  He had chest pain in January 2014 and was seen at [facility]; however, no intervention was performed at that time. Recently he had some dyspepsia treated by a PPI which has resolved. He has had a little bit of weight gain and decreased exercise, which he is working on, and some dietary indiscretion. He also had a metabolic test in our office just on August 08, 2012, which showed good exercise effort, peak VO2 of 79% predicted with normal VO2 heart-rate curves indicating only mild deconditioning but no evidence of ischemia.   Since his last office visit he is adopted a vegetarian lifestyle and has managed to lose almost 20 pounds. He continues to exercise and his made great strides as far as his symptoms are concerned. He still has occasional episodes of angina which are nitrate responsive. He is concerned however about taking higher doses of Imdur as he read that it may not be effective in the long term.  He is inquiring about possibly coming off the medication if possible. He is no longer taking little low as he has made dietary changes and wishes to use a natural approach rather than statin medication.  I've reminded him that because he has significant coronary disease and multiple stents that statins are still indicated despite dietary changes.  [name] is doing well today. Denies any chest pain. He is interested in seeing what his cholesterol is coming down to now that he's been on a  vegan and diet. He is not on any cholesterol medications. He also reports some sexual dysfunction and is interested in Viagra.  PMHx:  Past Medical History  Diagnosis Date  . Coronary artery disease   . Hypertension   . Dyslipidemia     statin-intolerance   . Angina   . S/P angioplasty with stent, DES, to proximal and mid LCX 12/15/11 12/15/2011  . Myocardial infarction ~ 2010  . Type II diabetes mellitus   . GERD (gastroesophageal reflux disease)   . Arthritis     "lower back; shoulders; neck"  . Chronic lower back pain   . 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 Excisional hemorrhoidectomy  1984  . Lipoma excision      back of the head  . Cardiometabolic testing  0/99/8338    good exercise effort, peak VO2 79% predicted with normal VO2 HR curves (mild deconditioning)  . Nm myocar perf wall motion  02/2012    lexiscan myoview; mild perfusion defect in mid inferolateral & basal inferolateral region (infarct/scar); EF 52%, abnormal but ow risk scan  . Cardiac catheterization  06/15/2002    LAD with prox 40% stenosis, norma L main, Cfx with 25% lesion, RCA with long mid 25% stenosis (Dr. Vita Barley)  . Cardiac catheterization  04/01/2010    normal L main, LAD wit mild stenosis, L Cfx with 70% in-stent restenosis, RCA with 70% in-stent restenosis, LVEF >60% (Dr. K. Mali Riah Kehoe) - cutting  ballon arthrectomy to RCA & Cfx (Dr. Loni Muse. Little)  . Cardiac catheterization  08/25/2010    preserved global LV contractility; multivessel CAD, diffuse 90-95% in-stent restenosis in prox placed Cfx stent - cutting balloon arthrectomy in Cfx with multiple dilatations 90-95% to 0% stenosis (Dr. Corky Downs)  . Cardiac catheterization  01/26/2011    PCI & stenting of aggresive in-stent restenosis within previously stented AV groove Cfx with 3.0x59mm Taxus DES (previous stents were Promus) (Dr. Adora Fridge)  . Cardiac catheterization  05/11/2011     preserved LV function, 40% mid LAD stenosis, 30-40% narrowing proximal to stented semgnet of prox Cfx, patent mid RCA stent with smooth 20% narrowing in distal RCA (Dr. Corky Downs)  . Cardiac catheterization  12/15/2011    PCI & stenting of proximal & mid Cfx with DES - 3.0x62mm in proximal, 3.0x66mm in mid (Dr. Adora Fridge)    FAMHx:  Family History  Problem Relation Age of Onset  . Coronary artery disease Paternal Grandmother   . Leukemia Mother   . Prostate cancer Father   . Cancer Paternal Grandfather   . Cancer Brother     SOCHx:   reports that he quit smoking about 4 years ago. His smoking use included Cigarettes. He has a 10 pack-year smoking history. He has never used smokeless tobacco. He reports that he uses illicit drugs (Cocaine). He reports that he does not drink alcohol.  ALLERGIES:  Allergies  Allergen Reactions  . Bee Venom Anaphylaxis and Hives  . Shellfish Allergy Anaphylaxis and Hives    ROS: A comprehensive review of systems was negative except for: Erectile dysfunciton  HOME MEDS: Current Outpatient Prescriptions  Medication Sig Dispense Refill  . aspirin 325 MG EC tablet Take 325 mg by mouth every evening.       . carvedilol (COREG) 12.5 MG tablet Take 12.5 mg by mouth 2 (two) times daily with a meal.      . ciprofloxacin (CIPRO) 500 MG tablet Take 500 mg by mouth 2 (two) times daily.      Marland Kitchen gabapentin (NEURONTIN) 300 MG capsule Take 300 mg by mouth at bedtime.      . isosorbide mononitrate (IMDUR) 60 MG 24 hr tablet Take 60 mg by mouth daily.      Marland Kitchen lisinopril (PRINIVIL,ZESTRIL) 5 MG tablet Take 5 mg by mouth daily.      . metFORMIN (GLUCOPHAGE) 1000 MG tablet Take 1,000 mg by mouth 2 (two) times daily with a meal.      . mirtazapine (REMERON) 30 MG tablet Take 30 mg by mouth at bedtime.      . nitroGLYCERIN (NITROSTAT) 0.4 MG SL tablet Place 1 tablet (0.4 mg total) under the tongue every 5 (five) minutes as needed. For chest pain  25 tablet  3  . Omega-3  Fatty Acids (FISH OIL PO) Take 1,200 mg by mouth daily.       Marland Kitchen testosterone cypionate (DEPOTESTOTERONE CYPIONATE) 200 MG/ML injection Inject 200 mg into the muscle every 28 (twenty-eight) days.      . traMADol (ULTRAM) 50 MG tablet Take 50 mg by mouth 2 (two) times daily. For pain      . sildenafil (REVATIO) 20 MG tablet Take 2-3 tablets (40-60 mg total) by mouth daily as needed.  60 tablet  0  . sildenafil (VIAGRA) 50 MG tablet Take 1 tablet (50 mg total) by mouth daily as needed for erectile dysfunction.  8 tablet  0   No current facility-administered medications for  this visit.    LABS/IMAGING: No results found for this or any previous visit (from the past 48 hour(s)). No results found.  VITALS: BP 124/90  Pulse 62  Ht 5\' 8"  (1.727 m)  Wt 185 lb 3.2 oz (84.006 kg)  BMI 28.17 kg/m2  EXAM: General appearance: alert and no distress Neck: no carotid bruit and no JVD Lungs: clear to auscultation bilaterally Heart: regular rate and rhythm, S1, S2 normal, no murmur, click, rub or gallop Abdomen: soft, non-tender; bowel sounds normal; no masses,  no organomegaly Extremities: extremities normal, atraumatic, no cyanosis or edema Pulses: 2+ and symmetric Skin: Skin color, texture, turgor normal. No rashes or lesions Neurologic: Grossly normal Psych: Mood, affect normal  EKG: Normal sinus rhythm at 62  ASSESSMENT: 1. Coronary artery disease status post multiple PCI as with the last stent to the circumflex in 2013 2. Dyslipidemia-statin intolerant 3. Hypertension 4. Diabetes type 2 5. Stable angina 6. Erectile dysfunction  PLAN: 1.   Mr. Leavell has been chest pain-free for over a year now. He is adopted a vegan diet. He has not had any chest pain with exertion. He has had troubles with statins in the past and looks to see if his cholesterol is improved. I am leery that he will reach his target with diet alone. Will recheck a lipid profile today. He is also describing some erectile  dysfunction. We've given samples of Viagra and provided a prescription for that. He was instructed not to use Viagra with nitrates. Plan to see him back in 6 months.  Pixie Casino, MD, [facility] Attending Cardiologist [facility]  Loretta Kluender C 02/15/2014, 9:37 AM

## 2014-02-16 ENCOUNTER — Telehealth: Payer: Self-pay | Admitting: Internal Medicine

## 2014-02-16 LAB — NMR LIPOPROFILE WITH LIPIDS
Cholesterol, Total: 226 mg/dL — ABNORMAL HIGH (ref <200)
HDL Particle Number: 30.4 umol/L — ABNORMAL LOW (ref >=30.5)
HDL Size: 8.2 nm — ABNORMAL LOW (ref >=9.2)
HDL-C: 42 mg/dL (ref >=40)
LDL (calc): 142 mg/dL — ABNORMAL HIGH (ref <100)
LDL PARTICLE NUMBER: 2177 nmol/L — AB (ref <1000)
LDL Size: 20.3 nm — ABNORMAL LOW (ref >20.5)
LP-IR Score: 67 — ABNORMAL HIGH (ref <=45)
Large HDL-P: <1.3 umol/L — ABNORMAL LOW (ref >=4.8)
Large VLDL-P: 3.3 nmol/L — ABNORMAL HIGH (ref <=2.7)
SMALL LDL PARTICLE NUMBER: 1118 nmol/L — AB (ref <=527)
Triglycerides: 208 mg/dL — ABNORMAL HIGH (ref <150)
VLDL SIZE: 45 nm (ref <=46.6)

## 2014-02-16 MED ORDER — ISOSORBIDE MONONITRATE ER 60 MG PO TB24: 60.0000 mg | ORAL_TABLET | Freq: Every day | ORAL | Status: DC

## 2014-02-16 NOTE — Telephone Encounter (Signed)
Pt called in stating that he needs a new rx for Isosorbide 60mg .

## 2014-02-16 NOTE — Telephone Encounter (Signed)
Spoke with pt, script sent to [place]

## 2014-02-19 ENCOUNTER — Telehealth: Payer: Self-pay | Admitting: Internal Medicine

## 2014-02-19 MED ORDER — PITAVASTATIN CALCIUM 4 MG PO TABS: 4.0000 mg | ORAL_TABLET | Freq: Every day | ORAL | Status: DC

## 2014-02-19 NOTE — Telephone Encounter (Signed)
Returning your call. °

## 2014-02-19 NOTE — Progress Notes (Signed)
LMTCB for lab results.  

## 2014-02-19 NOTE — Telephone Encounter (Signed)
Samples at front desk for patient to pick up. Patient aware. He will call office to let us know if he is tolerating med OK

## 2014-03-12 ENCOUNTER — Telehealth: Payer: Self-pay | Admitting: Internal Medicine

## 2014-03-12 MED ORDER — PITAVASTATIN CALCIUM 4 MG PO TABS: 4.0000 mg | ORAL_TABLET | Freq: Every day | ORAL | Status: DC

## 2014-03-12 NOTE — Telephone Encounter (Signed)
Rx refill sent to patient pharmacy   

## 2014-03-12 NOTE — Telephone Encounter (Signed)
Pt need a refilll on Livalo 4 mg #90. Please call to [phone].

## 2014-03-14 ENCOUNTER — Telehealth: Payer: Self-pay | Admitting: Internal Medicine

## 2014-03-14 NOTE — Telephone Encounter (Signed)
Pt called in stating that he would like a new prescription of Livalo 4mg  called in and sent to the [facility] at [facility].   Thanks

## 2014-03-14 NOTE — Telephone Encounter (Signed)
Refilled #90 tablets with 2 refills on 03/12/2014. Patient notified.

## 2014-03-15 ENCOUNTER — Other Ambulatory Visit: Payer: Self-pay | Admitting: *Deleted

## 2014-03-15 ENCOUNTER — Telehealth: Payer: Self-pay | Admitting: Internal Medicine

## 2014-03-15 MED ORDER — PITAVASTATIN CALCIUM 4 MG PO TABS: 4.0000 mg | ORAL_TABLET | Freq: Every day | ORAL | Status: DC

## 2014-03-15 NOTE — Telephone Encounter (Signed)
Samples provided as Livalo has to undergo prior authorization process with insurance. Patient is aware and will pick up samples 03/16/14  Faxed prior authorization to [phone] [facility])

## 2014-03-15 NOTE — Telephone Encounter (Signed)
Pt says he just left the pharmacy for his Livalo and they said they were waiting for doctor's authorization.

## 2014-03-26 ENCOUNTER — Ambulatory Visit: Payer: Medicaid Other | Admitting: Neurology

## 2014-03-28 ENCOUNTER — Telehealth: Payer: Self-pay | Admitting: Internal Medicine

## 2014-03-28 MED ORDER — PITAVASTATIN CALCIUM 4 MG PO TABS: 4.0000 mg | ORAL_TABLET | Freq: Every day | ORAL | Status: DC

## 2014-03-28 NOTE — Telephone Encounter (Signed)
Informed patient that prior authorization was faxed on 9/17 - re-faxed 9/30 Called pharmacy and was informed that medication needs a PA  Samples provided and left for patient to pick up.   Called [employer] - Livalo 4mg  approved 9/30 (for 1 year) Approval # [id number]  Pharmacy notified of approval.  Patient notified of approval.

## 2014-03-28 NOTE — Telephone Encounter (Signed)
Pt says he have been waiting for his Livalo for about a month. Please call his Livalo 4 mg and #90 to [phone].

## 2014-03-29 ENCOUNTER — Telehealth: Payer: Self-pay | Admitting: Neurology

## 2014-03-29 ENCOUNTER — Telehealth: Payer: Self-pay | Admitting: *Deleted

## 2014-03-29 ENCOUNTER — Ambulatory Visit: Payer: Medicaid Other | Admitting: Neurology

## 2014-03-29 NOTE — Telephone Encounter (Signed)
Patient forgot about his appointment this morning with Dr. Rexene Alberts, calling to reschedule, wants to come in next week, please return call and advise.

## 2014-03-29 NOTE — Telephone Encounter (Signed)
Patient returning call to [facility], please call and advise.

## 2014-03-29 NOTE — Telephone Encounter (Signed)
Patient is a no show for today's appt. (03/29/14@ 9:00)

## 2014-03-29 NOTE — Telephone Encounter (Signed)
Left message for patient to call office to reschedule.

## 2014-04-02 ENCOUNTER — Encounter: Payer: Self-pay | Admitting: Neurology

## 2014-04-02 ENCOUNTER — Encounter (INDEPENDENT_AMBULATORY_CARE_PROVIDER_SITE_OTHER): Payer: Self-pay

## 2014-04-02 ENCOUNTER — Ambulatory Visit (INDEPENDENT_AMBULATORY_CARE_PROVIDER_SITE_OTHER): Payer: Medicaid Other | Admitting: Neurology

## 2014-04-02 ENCOUNTER — Ambulatory Visit: Payer: Self-pay | Admitting: Neurology

## 2014-04-02 VITALS — BP 124/76 | HR 91 | Temp 98.6°F | Resp 12 | Ht 68.5 in | Wt 189.0 lb

## 2014-04-02 DIAGNOSIS — M79672 Pain in left foot: Secondary | ICD-10-CM

## 2014-04-02 DIAGNOSIS — M79671 Pain in right foot: Secondary | ICD-10-CM

## 2014-04-02 DIAGNOSIS — M545 Low back pain, unspecified: Secondary | ICD-10-CM

## 2014-04-02 DIAGNOSIS — E0842 Diabetes mellitus due to underlying condition with diabetic polyneuropathy: Secondary | ICD-10-CM

## 2014-04-02 MED ORDER — GABAPENTIN 300 MG PO CAPS: 600.0000 mg | ORAL_CAPSULE | Freq: Every day | ORAL | Status: DC
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
## 2014-04-02 NOTE — Patient Instructions (Signed)
We will increase your gabapentin to 600 mg each night.  Follow up in 6 month.

## 2014-05-15 ENCOUNTER — Encounter: Payer: Self-pay | Admitting: Neurology

## 2014-06-07 ENCOUNTER — Encounter (HOSPITAL_COMMUNITY): Payer: Self-pay | Admitting: Cardiovascular Disease

## 2014-06-25 ENCOUNTER — Encounter: Payer: Self-pay | Admitting: Neurology

## 2014-06-25 ENCOUNTER — Telehealth: Payer: Self-pay | Admitting: *Deleted

## 2014-06-25 NOTE — Telephone Encounter (Signed)
Pt called back to state that he would like for his letter to be mailed to him.

## 2014-06-25 NOTE — Telephone Encounter (Signed)
Letter mailed to patient.

## 2014-07-05 ENCOUNTER — Encounter: Payer: Self-pay | Admitting: Internal Medicine

## 2014-07-05 ENCOUNTER — Telehealth: Payer: Self-pay | Admitting: *Deleted
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
## 2014-09-04 ENCOUNTER — Encounter (HOSPITAL_COMMUNITY): Payer: Self-pay | Admitting: *Deleted

## 2014-09-04 ENCOUNTER — Emergency Department (HOSPITAL_COMMUNITY): Payer: Medicaid Other

## 2014-09-04 ENCOUNTER — Inpatient Hospital Stay (HOSPITAL_COMMUNITY)
Admission: EM | Admit: 2014-09-04 | Discharge: 2014-09-06 | DRG: 247 | Disposition: A | Discharge status: Home / Self Care | Payer: Medicaid Other | Attending: Internal Medicine | Admitting: Internal Medicine

## 2014-09-04 DIAGNOSIS — M1990 Unspecified osteoarthritis, unspecified site: Secondary | ICD-10-CM | POA: Diagnosis present

## 2014-09-04 DIAGNOSIS — I25110 Atherosclerotic heart disease of native coronary artery with unstable angina pectoris: Principal | ICD-10-CM | POA: Diagnosis present

## 2014-09-04 DIAGNOSIS — G8929 Other chronic pain: Secondary | ICD-10-CM | POA: Diagnosis present

## 2014-09-04 DIAGNOSIS — E119 Type 2 diabetes mellitus without complications: Secondary | ICD-10-CM | POA: Diagnosis present

## 2014-09-04 DIAGNOSIS — Z91013 Allergy to seafood: Secondary | ICD-10-CM

## 2014-09-04 DIAGNOSIS — Z87891 Personal history of nicotine dependence: Secondary | ICD-10-CM

## 2014-09-04 DIAGNOSIS — M545 Low back pain: Secondary | ICD-10-CM | POA: Diagnosis present

## 2014-09-04 DIAGNOSIS — Z955 Presence of coronary angioplasty implant and graft: Secondary | ICD-10-CM

## 2014-09-04 DIAGNOSIS — I2510 Atherosclerotic heart disease of native coronary artery without angina pectoris: Secondary | ICD-10-CM

## 2014-09-04 DIAGNOSIS — I10 Essential (primary) hypertension: Secondary | ICD-10-CM | POA: Diagnosis present

## 2014-09-04 DIAGNOSIS — E785 Hyperlipidemia, unspecified: Secondary | ICD-10-CM | POA: Diagnosis present

## 2014-09-04 DIAGNOSIS — R072 Precordial pain: Secondary | ICD-10-CM

## 2014-09-04 DIAGNOSIS — Z7982 Long term (current) use of aspirin: Secondary | ICD-10-CM

## 2014-09-04 DIAGNOSIS — K219 Gastro-esophageal reflux disease without esophagitis: Secondary | ICD-10-CM | POA: Diagnosis present

## 2014-09-04 DIAGNOSIS — R079 Chest pain, unspecified: Secondary | ICD-10-CM

## 2014-09-04 DIAGNOSIS — I252 Old myocardial infarction: Secondary | ICD-10-CM

## 2014-09-04 DIAGNOSIS — Z888 Allergy status to other drugs, medicaments and biological substances status: Secondary | ICD-10-CM

## 2014-09-04 DIAGNOSIS — R0789 Other chest pain: Secondary | ICD-10-CM

## 2014-09-04 LAB — BASIC METABOLIC PANEL WITH GFR
Anion gap: 2 — ABNORMAL LOW (ref 5–15)
BUN: 9 mg/dL (ref 6–23)
CO2: 29 mmol/L (ref 19–32)
Calcium: 9.2 mg/dL (ref 8.4–10.5)
Chloride: 107 mmol/L (ref 96–112)
Creatinine, Ser: 0.81 mg/dL (ref 0.50–1.35)
GFR calc Af Amer: >90 mL/min
GFR calc non Af Amer: >90 mL/min
Glucose, Bld: 92 mg/dL (ref 70–99)
Potassium: 4.1 mmol/L (ref 3.5–5.1)
Sodium: 138 mmol/L (ref 135–145)

## 2014-09-04 LAB — CBC
HCT: 41.1 % (ref 39.0–52.0)
Hemoglobin: 13.7 g/dL (ref 13.0–17.0)
MCH: 28.3 pg (ref 26.0–34.0)
MCHC: 33.3 g/dL (ref 30.0–36.0)
MCV: 84.9 fL (ref 78.0–100.0)
Platelets: 230 K/uL (ref 150–400)
RBC: 4.84 MIL/uL (ref 4.22–5.81)
RDW: 12.8 % (ref 11.5–15.5)
WBC: 5.6 K/uL (ref 4.0–10.5)

## 2014-09-04 LAB — I-STAT TROPONIN, ED: Troponin i, poc: 0 ng/mL (ref 0.00–0.08)

## 2014-09-04 LAB — TROPONIN I: Troponin I: <0.03 ng/mL (ref <0.031)

## 2014-09-04 LAB — CBG MONITORING, ED: Glucose-Capillary: 146 mg/dL — ABNORMAL HIGH (ref 70–99)

## 2014-09-04 MED ORDER — GABAPENTIN 300 MG PO CAPS
600.0000 mg | ORAL_CAPSULE | Freq: Every day | ORAL | Status: DC
  Administered 2014-09-05: 22:00:00 600 mg via ORAL
  Filled 2014-09-04 (×4): qty 2

## 2014-09-04 MED ORDER — HEPARIN BOLUS VIA INFUSION
4000.0000 [IU] | Freq: Once | INTRAVENOUS | Status: AC
  Administered 2014-09-04: 4000 [IU] via INTRAVENOUS
  Filled 2014-09-04: qty 4000

## 2014-09-04 MED ORDER — ISOSORBIDE MONONITRATE ER 60 MG PO TB24
60.0000 mg | ORAL_TABLET | Freq: Every day | ORAL | Status: DC
  Administered 2014-09-05 – 2014-09-06 (×2): 60 mg via ORAL
  Filled 2014-09-04 (×2): qty 1

## 2014-09-04 MED ORDER — ACETAMINOPHEN 325 MG PO TABS
650.0000 mg | ORAL_TABLET | ORAL | Status: DC | PRN
  Administered 2014-09-05: 650 mg via ORAL
  Filled 2014-09-04: qty 2

## 2014-09-04 MED ORDER — ONDANSETRON HCL 4 MG/2ML IJ SOLN: 4.0000 mg | Freq: Four times a day (QID) | INTRAMUSCULAR | Status: DC | PRN

## 2014-09-04 MED ORDER — ASPIRIN 81 MG PO CHEW
324.0000 mg | CHEWABLE_TABLET | Freq: Once | ORAL | Status: AC
  Administered 2014-09-04: 324 mg via ORAL
  Filled 2014-09-04: qty 4

## 2014-09-04 MED ORDER — ASPIRIN EC 325 MG PO TBEC
325.0000 mg | DELAYED_RELEASE_TABLET | Freq: Every evening | ORAL | Status: DC
  Filled 2014-09-04: qty 1

## 2014-09-04 MED ORDER — TRAMADOL HCL 50 MG PO TABS
50.0000 mg | ORAL_TABLET | Freq: Two times a day (BID) | ORAL | Status: DC
  Administered 2014-09-05 – 2014-09-06 (×2): 50 mg via ORAL
  Filled 2014-09-04 (×3): qty 1

## 2014-09-04 MED ORDER — MIRTAZAPINE 30 MG PO TABS
30.0000 mg | ORAL_TABLET | Freq: Every day | ORAL | Status: DC
  Administered 2014-09-05: 22:00:00 30 mg via ORAL
  Filled 2014-09-04 (×4): qty 1

## 2014-09-04 MED ORDER — HEPARIN (PORCINE) IN NACL 100-0.45 UNIT/ML-% IJ SOLN
1100.0000 [IU]/h | INTRAMUSCULAR | Status: DC
Start: 2014-09-04 — End: 2014-09-04
  Administered 2014-09-04: 1100 [IU]/h via INTRAVENOUS
  Filled 2014-09-04: qty 250

## 2014-09-04 MED ORDER — ZOLPIDEM TARTRATE 5 MG PO TABS: 5.0000 mg | ORAL_TABLET | Freq: Every evening | ORAL | Status: DC | PRN

## 2014-09-04 MED ORDER — CARVEDILOL 12.5 MG PO TABS
12.5000 mg | ORAL_TABLET | Freq: Two times a day (BID) | ORAL | Status: DC
  Administered 2014-09-05 – 2014-09-06 (×2): 12.5 mg via ORAL
  Filled 2014-09-04 (×4): qty 1

## 2014-09-04 MED ORDER — OMEGA-3-ACID ETHYL ESTERS 1 G PO CAPS
1.0000 g | ORAL_CAPSULE | Freq: Every day | ORAL | Status: DC
  Administered 2014-09-05 – 2014-09-06 (×2): 1 g via ORAL
  Filled 2014-09-04 (×2): qty 1

## 2014-09-04 MED ORDER — HEPARIN SODIUM (PORCINE) 5000 UNIT/ML IJ SOLN
5000.0000 [IU] | Freq: Three times a day (TID) | INTRAMUSCULAR | Status: DC
  Administered 2014-09-04 – 2014-09-05 (×2): 5000 [IU] via SUBCUTANEOUS
  Filled 2014-09-04 (×5): qty 1

## 2014-09-04 MED ORDER — NITROGLYCERIN 0.4 MG SL SUBL
0.4000 mg | SUBLINGUAL_TABLET | SUBLINGUAL | Status: DC | PRN
  Administered 2014-09-05: 0.4 mg via SUBLINGUAL

## 2014-09-04 NOTE — ED Notes (Signed)
Tray ordered.

## 2014-09-04 NOTE — ED Notes (Signed)
CBG 146 

## 2014-09-04 NOTE — ED Notes (Signed)
Heparin stopped per MD request (Cardiologist)

## 2014-09-04 NOTE — H&P (Signed)
Patient ID: [name] MRN: [id number], DOB/AGE: [birth year]   Admit date: 09/04/2014   Primary Physician: [name], FNP Primary Cardiologist: Dr. Debara Pickett  Pt. Profile:  54 year old African-American male with past medical history of HTN, HLD, DM, history of microvascular angina, CAD status post multiple PCIs present with recurrent CP  Problem List  Past Medical History  Diagnosis Date  . Coronary artery disease   . Hypertension   . Dyslipidemia     statin-intolerance   . Angina   . S/P angioplasty with stent, DES, to proximal and mid LCX 12/15/11 12/15/2011  . Myocardial infarction ~ 2010  . Type II diabetes mellitus   . GERD (gastroesophageal reflux disease)   . Arthritis     "lower back; shoulders; neck"  . Chronic lower back pain   . 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 Excisional hemorrhoidectomy  1984  . Lipoma excision      back of the head  . Cardiometabolic testing  4/58/0998    good exercise effort, peak VO2 79% predicted with normal VO2 HR curves (mild deconditioning)  . Nm myocar perf wall motion  02/2012    lexiscan myoview; mild perfusion defect in mid inferolateral & basal inferolateral region (infarct/scar); EF 52%, abnormal but ow risk scan  . Cardiac catheterization  06/15/2002    LAD with prox 40% stenosis, norma L main, Cfx with 25% lesion, RCA with long mid 25% stenosis (Dr. Vita Barley)  . Cardiac catheterization  04/01/2010    normal L main, LAD wit mild stenosis, L Cfx with 70% in-stent restenosis, RCA with 70% in-stent restenosis, LVEF >60% (Dr. K. Mali Hilty) - cutting ballon arthrectomy to RCA & Cfx (Dr. Rockne Menghini)  . Cardiac catheterization  08/25/2010    preserved global LV contractility; multivessel CAD, diffuse 90-95% in-stent restenosis in prox placed Cfx stent - cutting balloon arthrectomy in Cfx with multiple dilatations 90-95% to 0% stenosis (Dr. Corky Downs)  .  Cardiac catheterization  01/26/2011    PCI & stenting of aggresive in-stent restenosis within previously stented AV groove Cfx with 3.0x19mm Taxus DES (previous stents were Promus) (Dr. Adora Fridge)  . Cardiac catheterization  05/11/2011    preserved LV function, 40% mid LAD stenosis, 30-40% narrowing proximal to stented semgnet of prox Cfx, patent mid RCA stent with smooth 20% narrowing in distal RCA (Dr. Corky Downs)  . Cardiac catheterization  12/15/2011    PCI & stenting of proximal & mid Cfx with DES - 3.0x89mm in proximal, 3.0x20mm in mid (Dr. Adora Fridge)  . Left heart catheterization with coronary angiogram N/A 05/11/2011    Procedure: LEFT HEART CATHETERIZATION WITH CORONARY ANGIOGRAM;  Surgeon: Troy Sine, MD;  Location: [facility] CATH LAB;  Service: Cardiovascular;  Laterality: N/A;  Possible percutaneous coronary intervention, possible IVUS  . Left heart catheterization with coronary angiogram N/A 12/15/2011    Procedure: LEFT HEART CATHETERIZATION WITH CORONARY ANGIOGRAM;  Surgeon: Lorretta Harp, MD;  Location: [facility] CATH LAB;  Service: Cardiovascular;  Laterality: N/A;     Allergies  Allergies  Allergen Reactions  . Bee Venom Anaphylaxis and Hives  . Shellfish Allergy Anaphylaxis and Hives  . Statins Other (See Comments)    myalgias    HPI  The patient is a pleasant 54 year old African-American male with past medical history of HTN, HLD, DM, history of microvascular angina, CAD status post multiple PCIs. According to record, patient  had a cardiac catheterization in 2003, 2012 and 2013. His last cardiac catheterization was on 12/14/2011 which showed minor irregularities in LAD, normal left main, 70% in-stent restenosis in mid left circumflex, intracoronary nitroglycerin injected in RCA for what appears to be spasm, EF 60%. The in-stent restenosis in the circumflex artery was treated with 3.015 mm DES, he also received a 3 mm x 12 mm long DES to proximal left circumflex.  Last stress test in  2013 showed EF 52%, mild perfusion defect in mid inferolateral and basal inferolateral region consistent with scar, overall considered low risk study. He also had a metabolic test in the office in February 2014 which showed good exercise effort, peak VO2 of 79% predicted with normal VO2 heart rate curves indicating only mild deconditioning but no evidence of ischemia.  He was last seen by Dr. Debara Pickett in August 2015 at which time he was doing well.  He was seen by neurology group in October 2015 for bilateral foot pain which is likely related to diabetes. He was placed on Neurontin. According to Dr. Lysbeth Penner last note, patient has adopted a vegetarian lifestyle and managed to lose almost 20 pounds. He also increased activity level. According to the patient, since his last follow-up, he had a five-month period of severe depression during which time he binged on chicken wings, [employer] and pizza. He presented to [facility] ED on 3/80/2016 complain of recurrent chest pain. He states this is the similar the chest pain prior to last PCI. However he described the chest discomfort as a dull pain in substernal area lasting only seconds at a time and worse with inspiration. The frequency of the chest pain has been increasing for the past few days prompting him to take multiple doses of sublingual nitroglycerin. However because the chest discomfort is so short acting, the chest pain is usually gone before he took the nitroglycerin.   On arrival to [facility] ED, his blood pressure was 130/91. O2 saturation 99% on room air. Heart rate in the 60s. Troponin negative 2. Chest x-ray negative. EKG showed normal sinus rhythm without significant ST-T wave changes. Cardiology consulted for chest pain   Home Medications  Prior to Admission medications   Medication Sig Start Date End Date Taking? Authorizing Provider  aspirin 325 MG EC tablet Take 325 mg by mouth every evening.    Yes Historical Provider, MD  carvedilol (COREG)  12.5 MG tablet Take 12.5 mg by mouth 2 (two) times daily with a meal.   Yes Historical Provider, MD  gabapentin (NEURONTIN) 300 MG capsule Take 2 capsules (600 mg total) by mouth at bedtime. 04/02/14  Yes [name], MD  isosorbide mononitrate (IMDUR) 60 MG 24 hr tablet Take 1 tablet (60 mg total) by mouth daily. 02/16/14  Yes Pixie Casino, MD  lisinopril (PRINIVIL,ZESTRIL) 5 MG tablet Take 5 mg by mouth daily.   Yes Historical Provider, MD  metFORMIN (GLUCOPHAGE) 1000 MG tablet Take 1,000 mg by mouth 2 (two) times daily with a meal.   Yes Historical Provider, MD  mirtazapine (REMERON) 30 MG tablet Take 30 mg by mouth at bedtime.   Yes Historical Provider, MD  Omega-3 Fatty Acids (FISH OIL PO) Take 1,200 mg by mouth daily.    Yes Historical Provider, MD  Pitavastatin Calcium 4 MG TABS Take 4 mg by mouth daily.   Yes Historical Provider, MD  traMADol (ULTRAM) 50 MG tablet Take 50 mg by mouth 2 (two) times daily. For pain   Yes  Historical Provider, MD  ciprofloxacin (CIPRO) 500 MG tablet Take 500 mg by mouth 2 (two) times daily.    Historical Provider, MD  nitroGLYCERIN (NITROSTAT) 0.4 MG SL tablet Place 1 tablet (0.4 mg total) under the tongue every 5 (five) minutes as needed. For chest pain 08/28/13   Pixie Casino, MD  Pitavastatin Calcium 4 MG TABS Take 1 tablet (4 mg total) by mouth daily. 03/28/14   Pixie Casino, MD  sildenafil (REVATIO) 20 MG tablet Take 2-3 tablets (40-60 mg total) by mouth daily as needed. 02/15/14   Pixie Casino, MD  sildenafil (VIAGRA) 50 MG tablet Take 1 tablet (50 mg total) by mouth daily as needed for erectile dysfunction. 02/15/14   Pixie Casino, MD    Family History  Family History  Problem Relation Age of Onset  . Coronary artery disease Paternal Grandmother   . Leukemia Mother   . Prostate cancer Father   . Cancer Paternal Grandfather   . Cancer Brother     Social History  History   Social History  . Marital Status: Divorced    Spouse Name:  N/A  . Number of Children: 2  . Years of Education: GED   Occupational History  . Not on file.   Social History Main Topics  . Smoking status: Former Smoker -- 1.00 packs/day for 10 years    Types: Cigarettes    Quit date: 04/07/2009  . Smokeless tobacco: Never Used  . Alcohol Use: No     Comment: 12/15/11 "til 2010, drank a pint of gin/day at least"  . Drug Use: Yes    Special: Cocaine     Comment: 12/15/11 "last cocaine was 2010"  . Sexual Activity: Not Currently   Other Topics Concern  . Not on file   Social History Narrative     Review of Systems General:  No chills, fever, night sweats or weight changes.  Cardiovascular:  No dyspnea on exertion, edema, palpitations, paroxysmal nocturnal dyspnea. +chest pain, chronically sleep on 5 pillows due to GERD Dermatological: No rash, lesions/masses Respiratory: No cough, dyspnea Urologic: No hematuria, dysuria Abdominal:   No nausea, vomiting, diarrhea, bright red blood per rectum, melena, or hematemesis Neurologic:  No visual changes, wkns, changes in mental status. All other systems reviewed and are otherwise negative except as noted above.  Physical Exam  Blood pressure 117/81, pulse 57, temperature 98.8 F (37.1 C), temperature source Oral, resp. rate 16, SpO2 98 %.  General: Pleasant, NAD Psych: Normal affect. Neuro: Alert and oriented X 3. Moves all extremities spontaneously. HEENT: Normal  Neck: Supple without bruits or JVD. Lungs:  Resp regular and unlabored, CTA. Heart: RRR no s3, s4, or murmurs. Abdomen: Soft, non-tender, non-distended, BS + x 4.  Extremities: No clubbing, cyanosis or edema. DP/PT/Radials 2+ and equal bilaterally.  Labs  Troponin [facility] Test)  Recent Labs  09/04/14 1328  TROPIPOC 0.00   No results for input(s): CKTOTAL, CKMB, TROPONINI in the last 72 hours. Lab Results  Component Value Date   WBC 5.6 09/04/2014   HGB 13.7 09/04/2014   HCT 41.1 09/04/2014   MCV 84.9  09/04/2014   PLT 230 09/04/2014    Recent Labs Lab 09/04/14 1359  NA 138  K 4.1  CL 107  CO2 29  BUN 9  CREATININE 0.81  CALCIUM 9.2  GLUCOSE 92   Lab Results  Component Value Date   CHOL 226* 02/15/2014   HDL 42 02/15/2014   LDLCALC  142* 02/15/2014   TRIG 208* 02/15/2014   Lab Results  Component Value Date   DDIMER <0.22 02/21/2011     Radiology/Studies  Dg Chest 2 View  09/04/2014   CLINICAL DATA:  Chest tightness for 3 days.  Chest pain.  EXAM: CHEST  2 VIEW  COMPARISON:  07/18/2012  FINDINGS: Heart and mediastinal contours are within normal limits. No focal opacities or effusions. No acute bony abnormality. Cardiac stent again noted.  IMPRESSION: No active cardiopulmonary disease.   Electronically Signed   By: Rolm Baptise M.D.   On: 09/04/2014 14:20    ECG  NSR without significant ST-T wave changes  ASSESSMENT AND PLAN  1. Atypical chest pain  - lasting secs at a time, worse with inspiration, not related to activity. Although pt is concerned as this is similar to the CP he had prior to previous PCI  - admit to obs, trend trop, obtain treadmill myoview in AM  2. CAD status post multiple PCIs 3. HTN 4. HLD 5. DM 6. history of microvascular angina  Signed, Woodward Ku 09/04/2014, 3:39 PM  Patient seen and examined with Almyra Deforest, PA-C. We discussed all aspects of the encounter. I agree with the assessment and plan as stated above.  CP quite atypical but he says it is somewhat reminiscent of previous angina. ECG and CEs remain negative. At baseline he is quite active without angina. We discussed cath versus stress test. He wants to start with stress test. Will schedule ETT/myoview for am.   Benay Spice 6:16 PM

## 2014-09-04 NOTE — Progress Notes (Signed)
  ANTICOAGULATION CONSULT NOTE - Initial Consult  Pharmacy Consult for heparin Indication: chest pain/ACS  Allergies  Allergen Reactions  . Bee Venom Anaphylaxis and Hives  . Shellfish Allergy Anaphylaxis and Hives  . Statins Other (See Comments)    myalgias    Patient Measurements:   Heparin Dosing Weight: 85kg  Vital Signs: Temp: 98.8 F (37.1 C) (03/08 1245) Temp Source: Oral (03/08 1245) BP: 136/80 mmHg (03/08 1647) Pulse Rate: 65 (03/08 1647)  Labs:  Recent Labs  09/04/14 1359  HGB 13.7  HCT 41.1  PLT 230  CREATININE 0.81  TROPONINI <0.03    CrCl cannot be calculated (Unknown ideal weight.).   Medical History: Past Medical History  Diagnosis Date  . Coronary artery disease   . Hypertension   . Dyslipidemia     statin-intolerance   . Angina   . S/P angioplasty with stent, DES, to proximal and mid LCX 12/15/11 12/15/2011  . Myocardial infarction ~ 2010  . Type II diabetes mellitus   . GERD (gastroesophageal reflux disease)   . Arthritis     "lower back; shoulders; neck"  . Chronic lower back pain   . Angina at rest 03/10/2012    microvascular angina    Medications:  Infusions:  . heparin    . heparin      Assessment: 24 yom presented to the ED with recurrent CP. To start IV heparin for anticoagulation. Troponin is negative so far. Planning myoview in AM per cardiology. Baseline CBC is WNL. Pt is not on any anticoagulation PTA.   Goal of Therapy:  Heparin level 0.3-0.7 units/ml Monitor platelets by anticoagulation protocol: Yes   Plan:  - Heparin bolus 4000 units IV x 1 - Heparin gtt 1100 units/hr - Check a 6 hour heparin level - Daily heparin level and CBC  Sharman Garrott, Rande Lawman 09/04/2014,4:51 PM

## 2014-09-04 NOTE — ED Notes (Signed)
Pt is here with chest pain for 3 days and history of MI and has stents in Left coronary.  Pt states chest pain is intermittent.  Pt states it is central without radiation

## 2014-09-04 NOTE — ED Notes (Signed)
Phlebotomy at the bedside  

## 2014-09-04 NOTE — Progress Notes (Signed)
Patient received via stretcher to room 2w30 for observation and services of cardiology-c/o chest pain in ER, pt denies pain or pressure at this time, will continue to monitor pt and assist as needed

## 2014-09-04 NOTE — ED Notes (Signed)
Physician at bedside.

## 2014-09-04 NOTE — ED Notes (Signed)
Cardiologist at bedside.  

## 2014-09-04 NOTE — ED Provider Notes (Signed)
CSN: [id number]     Arrival date & time 09/04/14  1231 History   First MD Initiated Contact with Patient 09/04/14 1319     Chief Complaint  Patient presents with  . Chest Pain     (Consider location/radiation/quality/duration/timing/severity/associated sxs/prior Treatment) HPI Comments: 54 y.o. M presents with cc of chest pain. Pt has a PMH of HTN, hyperlipidemia, angina at rest, MI, multiple PCI to LCx, and LCA stent. Pt reports for the past 3 days he has been experiencing intermittent episodes of discomfort in his chest that have been increasing in frequency. Pt describes the pain as non-radiating and dull. Pt states that the discomfort woke him up from his sleep at 2:15 am today and he was unable to go back to sleep until 6 am. Pt states that he has taken 3 nitro in the past 3 days. Pt denies SOB, palpitations, dizziness, fatigue, and N/V. Pt denies a PMH of blood clots, CVA, asthma, and COPD. Pt has a hx of smoking cigarettes but has no smoked in 4 years.   Patient is a 54 y.o. male presenting with chest pain. The history is provided by the patient.  Chest Pain   Past Medical History  Diagnosis Date  . Coronary artery disease   . Hypertension   . Dyslipidemia     statin-intolerance   . Angina   . S/P angioplasty with stent, DES, to proximal and mid LCX 12/15/11 12/15/2011  . Myocardial infarction ~ 2010  . Type II diabetes mellitus   . GERD (gastroesophageal reflux disease)   . Arthritis     "lower back; shoulders; neck"  . Chronic lower back pain   . 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 Excisional hemorrhoidectomy  1984  . Lipoma excision      back of the head  . Cardiometabolic testing  11/28/930    good exercise effort, peak VO2 79% predicted with normal VO2 HR curves (mild deconditioning)  . Nm myocar perf wall motion  02/2012    lexiscan myoview; mild perfusion defect in mid inferolateral & basal  inferolateral region (infarct/scar); EF 52%, abnormal but ow risk scan  . Cardiac catheterization  06/15/2002    LAD with prox 40% stenosis, norma L main, Cfx with 25% lesion, RCA with long mid 25% stenosis (Dr. Vita Barley)  . Cardiac catheterization  04/01/2010    normal L main, LAD wit mild stenosis, L Cfx with 70% in-stent restenosis, RCA with 70% in-stent restenosis, LVEF >60% (Dr. K. Mali Hilty) - cutting ballon arthrectomy to RCA & Cfx (Dr. Rockne Menghini)  . Cardiac catheterization  08/25/2010    preserved global LV contractility; multivessel CAD, diffuse 90-95% in-stent restenosis in prox placed Cfx stent - cutting balloon arthrectomy in Cfx with multiple dilatations 90-95% to 0% stenosis (Dr. Corky Downs)  . Cardiac catheterization  01/26/2011    PCI & stenting of aggresive in-stent restenosis within previously stented AV groove Cfx with 3.0x61mm Taxus DES (previous stents were Promus) (Dr. Adora Fridge)  . Cardiac catheterization  05/11/2011    preserved LV function, 40% mid LAD stenosis, 30-40% narrowing proximal to stented semgnet of prox Cfx, patent mid RCA stent with smooth 20% narrowing in distal RCA (Dr. Corky Downs)  . Cardiac catheterization  12/15/2011    PCI & stenting of proximal & mid Cfx with DES - 3.0x63mm in proximal, 3.0x19mm in mid (Dr. Adora Fridge)  . Left heart catheterization  with coronary angiogram N/A 05/11/2011    Procedure: LEFT HEART CATHETERIZATION WITH CORONARY ANGIOGRAM;  Surgeon: Troy Sine, MD;  Location: [facility] CATH LAB;  Service: Cardiovascular;  Laterality: N/A;  Possible percutaneous coronary intervention, possible IVUS  . Left heart catheterization with coronary angiogram N/A 12/15/2011    Procedure: LEFT HEART CATHETERIZATION WITH CORONARY ANGIOGRAM;  Surgeon: Lorretta Harp, MD;  Location: [facility] CATH LAB;  Service: Cardiovascular;  Laterality: N/A;   Family History  Problem Relation Age of Onset  . Coronary artery disease Paternal Grandmother   . Leukemia Mother   .  Prostate cancer Father   . Cancer Paternal Grandfather   . Cancer Brother    History  Substance Use Topics  . Smoking status: Former Smoker -- 1.00 packs/day for 10 years    Types: Cigarettes    Quit date: 04/07/2009  . Smokeless tobacco: Never Used  . Alcohol Use: No     Comment: 12/15/11 "til 2010, drank a pint of gin/day at least"    Review of Systems  Cardiovascular: Positive for chest pain.  All other systems reviewed and are negative.     Allergies  Bee venom; Shellfish allergy; and Statins  Home Medications   Prior to Admission medications   Medication Sig Start Date End Date Taking? Authorizing Provider  aspirin 325 MG EC tablet Take 325 mg by mouth every evening.    Yes Historical Provider, MD  carvedilol (COREG) 12.5 MG tablet Take 12.5 mg by mouth 2 (two) times daily with a meal.   Yes Historical Provider, MD  gabapentin (NEURONTIN) 300 MG capsule Take 2 capsules (600 mg total) by mouth at bedtime. 04/02/14  Yes [name], MD  isosorbide mononitrate (IMDUR) 60 MG 24 hr tablet Take 1 tablet (60 mg total) by mouth daily. 02/16/14  Yes Pixie Casino, MD  lisinopril (PRINIVIL,ZESTRIL) 5 MG tablet Take 5 mg by mouth daily.   Yes Historical Provider, MD  metFORMIN (GLUCOPHAGE) 1000 MG tablet Take 1,000 mg by mouth 2 (two) times daily with a meal.   Yes Historical Provider, MD  mirtazapine (REMERON) 30 MG tablet Take 30 mg by mouth at bedtime.   Yes Historical Provider, MD  Omega-3 Fatty Acids (FISH OIL PO) Take 1,200 mg by mouth daily.    Yes Historical Provider, MD  Pitavastatin Calcium 4 MG TABS Take 4 mg by mouth daily.   Yes Historical Provider, MD  traMADol (ULTRAM) 50 MG tablet Take 50 mg by mouth 2 (two) times daily. For pain   Yes Historical Provider, MD  ciprofloxacin (CIPRO) 500 MG tablet Take 500 mg by mouth 2 (two) times daily.    Historical Provider, MD  nitroGLYCERIN (NITROSTAT) 0.4 MG SL tablet Place 1 tablet (0.4 mg total) under the tongue every 5 (five)  minutes as needed. For chest pain 08/28/13   Pixie Casino, MD  Pitavastatin Calcium 4 MG TABS Take 1 tablet (4 mg total) by mouth daily. 03/28/14   Pixie Casino, MD  sildenafil (REVATIO) 20 MG tablet Take 2-3 tablets (40-60 mg total) by mouth daily as needed. 02/15/14   Pixie Casino, MD  sildenafil (VIAGRA) 50 MG tablet Take 1 tablet (50 mg total) by mouth daily as needed for erectile dysfunction. 02/15/14   Pixie Casino, MD   BP 122/85 mmHg  Pulse 64  Temp(Src) 98.8 F (37.1 C) (Oral)  Resp 16  SpO2 99% Physical Exam  Constitutional: He is oriented to person, place, and time. He appears  well-developed.  HENT:  Head: Normocephalic and atraumatic.  Eyes: Conjunctivae and EOM are normal. Pupils are equal, round, and reactive to light.  Neck: Normal range of motion. Neck supple.  Cardiovascular: Normal rate and regular rhythm.   Murmur heard. Pulmonary/Chest: Effort normal and breath sounds normal.  Abdominal: Soft. Bowel sounds are normal. He exhibits no distension. There is no tenderness. There is no rebound and no guarding.  Neurological: He is alert and oriented to person, place, and time. No cranial nerve deficit.  Skin: Skin is warm.  Nursing note and vitals reviewed.   ED Course  Procedures (including critical care time) Labs Review Labs Reviewed  BASIC METABOLIC PANEL - Abnormal; Notable for the following:    Anion gap 2 (*)    All other components within normal limits  CBC  TROPONIN I  Randolm Idol, ED    Imaging Review Dg Chest 2 View  09/04/2014   CLINICAL DATA:  Chest tightness for 3 days.  Chest pain.  EXAM: CHEST  2 VIEW  COMPARISON:  07/18/2012  FINDINGS: Heart and mediastinal contours are within normal limits. No focal opacities or effusions. No acute bony abnormality. Cardiac stent again noted.  IMPRESSION: No active cardiopulmonary disease.   Electronically Signed   By: Rolm Baptise M.D.   On: 09/04/2014 14:20     EKG Interpretation   Date/Time:   Tuesday September 04 2014 12:38:24 EST Ventricular Rate:  70 PR Interval:  152 QRS Duration: 80 QT Interval:  368 QTC Calculation: 397 R Axis:   22 Text Interpretation:  Normal sinus rhythm Possible Left atrial enlargement  RSR' or QR pattern in V1 suggests right ventricular conduction delay  Borderline ECG No significant change since last tracing Confirmed by  Kathrynn Humble, MD, Thelma Comp [phone]) on 09/04/2014 1:23:39 PM      MDM   Final diagnoses:  Chest pain of unknown etiology  CAD, multiple vessel   Differential diagnosis includes: ACS syndrome CHF exacerbation Valvular disorder Myocarditis Pericarditis Pericardial effusion Pneumonia Pleural effusion Pulmonary edema PE  PT comes in with cc of chest pain. Atypical chest pain, but patient has hx of CAD and he reports having similar chest pain in the past when he had NSTEMI and catheterizations. Initial workup is neg. Will consult cards. Currently chest pain free.   Varney Biles, MD 09/04/14 [phone]

## 2014-09-05 ENCOUNTER — Encounter (HOSPITAL_COMMUNITY)
Admission: EM | Disposition: A | Discharge status: Home / Self Care | Payer: Medicaid Other | Source: Home / Self Care | Attending: Internal Medicine

## 2014-09-05 ENCOUNTER — Observation Stay (HOSPITAL_COMMUNITY): Payer: Medicaid Other

## 2014-09-05 ENCOUNTER — Encounter (HOSPITAL_COMMUNITY): Payer: Self-pay | Admitting: Cardiovascular Disease

## 2014-09-05 DIAGNOSIS — Z888 Allergy status to other drugs, medicaments and biological substances status: Secondary | ICD-10-CM | POA: Diagnosis not present

## 2014-09-05 DIAGNOSIS — R0789 Other chest pain: Secondary | ICD-10-CM | POA: Diagnosis present

## 2014-09-05 DIAGNOSIS — R079 Chest pain, unspecified: Secondary | ICD-10-CM

## 2014-09-05 DIAGNOSIS — Z87891 Personal history of nicotine dependence: Secondary | ICD-10-CM | POA: Diagnosis not present

## 2014-09-05 DIAGNOSIS — Z91013 Allergy to seafood: Secondary | ICD-10-CM | POA: Diagnosis not present

## 2014-09-05 DIAGNOSIS — M545 Low back pain: Secondary | ICD-10-CM | POA: Diagnosis present

## 2014-09-05 DIAGNOSIS — G8929 Other chronic pain: Secondary | ICD-10-CM | POA: Diagnosis present

## 2014-09-05 DIAGNOSIS — Z955 Presence of coronary angioplasty implant and graft: Secondary | ICD-10-CM | POA: Diagnosis not present

## 2014-09-05 DIAGNOSIS — I10 Essential (primary) hypertension: Secondary | ICD-10-CM | POA: Diagnosis present

## 2014-09-05 DIAGNOSIS — E119 Type 2 diabetes mellitus without complications: Secondary | ICD-10-CM | POA: Diagnosis present

## 2014-09-05 DIAGNOSIS — I252 Old myocardial infarction: Secondary | ICD-10-CM | POA: Diagnosis not present

## 2014-09-05 DIAGNOSIS — E785 Hyperlipidemia, unspecified: Secondary | ICD-10-CM | POA: Diagnosis present

## 2014-09-05 DIAGNOSIS — Z7982 Long term (current) use of aspirin: Secondary | ICD-10-CM | POA: Diagnosis not present

## 2014-09-05 DIAGNOSIS — K219 Gastro-esophageal reflux disease without esophagitis: Secondary | ICD-10-CM | POA: Diagnosis present

## 2014-09-05 DIAGNOSIS — M1990 Unspecified osteoarthritis, unspecified site: Secondary | ICD-10-CM | POA: Diagnosis present

## 2014-09-05 DIAGNOSIS — I25110 Atherosclerotic heart disease of native coronary artery with unstable angina pectoris: Secondary | ICD-10-CM | POA: Diagnosis present

## 2014-09-05 HISTORY — PX: LEFT HEART CATHETERIZATION WITH CORONARY ANGIOGRAM: SHX5451

## 2014-09-05 HISTORY — PX: PERCUTANEOUS CORONARY STENT INTERVENTION (PCI-S): SHX5485

## 2014-09-05 LAB — LIPID PANEL
CHOL/HDL RATIO: 3.5 ratio
CHOLESTEROL: 153 mg/dL (ref 0–200)
HDL: 44 mg/dL (ref >39)
LDL CALC: 82 mg/dL (ref 0–99)
Triglycerides: 137 mg/dL (ref <150)
VLDL: 27 mg/dL (ref 0–40)

## 2014-09-05 LAB — CBC
HCT: 43.1 % (ref 39.0–52.0)
HEMOGLOBIN: 14.4 g/dL (ref 13.0–17.0)
MCH: 28.1 pg (ref 26.0–34.0)
MCHC: 33.4 g/dL (ref 30.0–36.0)
MCV: 84 fL (ref 78.0–100.0)
Platelets: 239 10*3/uL (ref 150–400)
RBC: 5.13 MIL/uL (ref 4.22–5.81)
RDW: 12.9 % (ref 11.5–15.5)
WBC: 5.3 10*3/uL (ref 4.0–10.5)

## 2014-09-05 LAB — BASIC METABOLIC PANEL
ANION GAP: 5 (ref 5–15)
BUN: 9 mg/dL (ref 6–23)
CALCIUM: 9.3 mg/dL (ref 8.4–10.5)
CO2: 31 mmol/L (ref 19–32)
Chloride: 102 mmol/L (ref 96–112)
Creatinine, Ser: 0.93 mg/dL (ref 0.50–1.35)
GFR calc Af Amer: >90 mL/min (ref >90)
GFR calc non Af Amer: >90 mL/min (ref >90)
GLUCOSE: 107 mg/dL — AB (ref 70–99)
Potassium: 4.1 mmol/L (ref 3.5–5.1)
Sodium: 138 mmol/L (ref 135–145)

## 2014-09-05 LAB — GLUCOSE, CAPILLARY
Glucose-Capillary: 112 mg/dL — ABNORMAL HIGH (ref 70–99)
Glucose-Capillary: 260 mg/dL — ABNORMAL HIGH (ref 70–99)

## 2014-09-05 LAB — TROPONIN I

## 2014-09-05 LAB — POCT ACTIVATED CLOTTING TIME: Activated Clotting Time: 484 seconds

## 2014-09-05 SURGERY — LEFT HEART CATHETERIZATION WITH CORONARY ANGIOGRAM
Anesthesia: LOCAL

## 2014-09-05 MED ORDER — HEPARIN SODIUM (PORCINE) 1000 UNIT/ML IJ SOLN
INTRAMUSCULAR | Status: AC
  Filled 2014-09-05: qty 1

## 2014-09-05 MED ORDER — SODIUM CHLORIDE 0.9 % IV SOLN: INTRAVENOUS | Status: AC

## 2014-09-05 MED ORDER — LIDOCAINE HCL (PF) 1 % IJ SOLN
INTRAMUSCULAR | Status: AC
  Filled 2014-09-05: qty 30

## 2014-09-05 MED ORDER — METHYLPREDNISOLONE SODIUM SUCC 125 MG IJ SOLR
125.0000 mg | INTRAMUSCULAR | Status: DC
  Filled 2014-09-05: qty 2

## 2014-09-05 MED ORDER — FAMOTIDINE IN NACL 20-0.9 MG/50ML-% IV SOLN
20.0000 mg | INTRAVENOUS | Status: AC
  Administered 2014-09-05: 20 mg via INTRAVENOUS
  Filled 2014-09-05: qty 50

## 2014-09-05 MED ORDER — METHYLPREDNISOLONE SODIUM SUCC 125 MG IJ SOLR
125.0000 mg | INTRAMUSCULAR | Status: AC
  Administered 2014-09-05: 125 mg via INTRAVENOUS
  Filled 2014-09-05: qty 2

## 2014-09-05 MED ORDER — FENTANYL CITRATE 0.05 MG/ML IJ SOLN
INTRAMUSCULAR | Status: AC
  Filled 2014-09-05: qty 2

## 2014-09-05 MED ORDER — NITROGLYCERIN 1 MG/10 ML FOR IR/CATH LAB
INTRA_ARTERIAL | Status: AC
  Filled 2014-09-05: qty 10

## 2014-09-05 MED ORDER — ASPIRIN 81 MG PO CHEW
81.0000 mg | CHEWABLE_TABLET | Freq: Every day | ORAL | Status: DC
  Administered 2014-09-06: 10:00:00 81 mg via ORAL
  Filled 2014-09-05: qty 1

## 2014-09-05 MED ORDER — CLOPIDOGREL BISULFATE 75 MG PO TABS
75.0000 mg | ORAL_TABLET | Freq: Every day | ORAL | Status: DC
  Administered 2014-09-06: 10:00:00 75 mg via ORAL
  Filled 2014-09-05: qty 1

## 2014-09-05 MED ORDER — BIVALIRUDIN 250 MG IV SOLR
INTRAVENOUS | Status: AC
  Filled 2014-09-05: qty 250

## 2014-09-05 MED ORDER — VERAPAMIL HCL 2.5 MG/ML IV SOLN
INTRAVENOUS | Status: AC
  Filled 2014-09-05: qty 2

## 2014-09-05 MED ORDER — TECHNETIUM TC 99M SESTAMIBI GENERIC - CARDIOLITE
10.0000 | Freq: Once | INTRAVENOUS | Status: AC | PRN
  Administered 2014-09-05: 10 via INTRAVENOUS

## 2014-09-05 MED ORDER — DIPHENHYDRAMINE HCL 50 MG/ML IJ SOLN
25.0000 mg | INTRAMUSCULAR | Status: AC
  Administered 2014-09-05: 25 mg via INTRAVENOUS
  Filled 2014-09-05: qty 1

## 2014-09-05 MED ORDER — DIPHENHYDRAMINE HCL 50 MG/ML IJ SOLN: 25.0000 mg | INTRAMUSCULAR | Status: DC

## 2014-09-05 MED ORDER — MIDAZOLAM HCL 2 MG/2ML IJ SOLN
INTRAMUSCULAR | Status: AC
  Filled 2014-09-05: qty 2

## 2014-09-05 MED ORDER — NITROGLYCERIN 0.4 MG SL SUBL
SUBLINGUAL_TABLET | SUBLINGUAL | Status: AC
  Filled 2014-09-05: qty 1

## 2014-09-05 MED ORDER — FAMOTIDINE IN NACL 20-0.9 MG/50ML-% IV SOLN: 20.0000 mg | INTRAVENOUS | Status: DC

## 2014-09-05 MED ORDER — HEPARIN (PORCINE) IN NACL 2-0.9 UNIT/ML-% IJ SOLN
INTRAMUSCULAR | Status: AC
  Filled 2014-09-05: qty 1000

## 2014-09-05 MED ORDER — CLOPIDOGREL BISULFATE 300 MG PO TABS
ORAL_TABLET | ORAL | Status: AC
  Filled 2014-09-05: qty 2

## 2014-09-05 MED ORDER — SODIUM CHLORIDE 0.9 % IV SOLN
INTRAVENOUS | Status: DC
  Administered 2014-09-05: 12:00:00 via INTRAVENOUS

## 2014-09-05 NOTE — Progress Notes (Signed)
UR completed 

## 2014-09-05 NOTE — Interval H&P Note (Signed)
History and Physical Interval Note:  09/05/2014 2:42 PM  [name]  has presented today for cardiac cath with the diagnosis of abnormal stress test, known CAD.  The various methods of treatment have been discussed with the patient and family. After consideration of risks, benefits and other options for treatment, the patient has consented to  Procedure(s): LEFT HEART CATHETERIZATION WITH CORONARY ANGIOGRAM (N/A) as a surgical intervention .  The patient's history has been reviewed, patient examined, no change in status, stable for surgery.  I have reviewed the patient's chart and labs.  Questions were answered to the patient's satisfaction.  Cath Lab Visit (complete for each Cath Lab visit)  Clinical Evaluation Leading to the Procedure:   ACS: No.  Non-ACS:    Anginal Classification: CCS III  Anti-ischemic medical therapy: Maximal Therapy (2 or more classes of medications)  Non-Invasive Test Results: Intermediate-risk stress test findings: cardiac mortality 1-3%/year  Prior CABG: No previous CABG          [name]

## 2014-09-05 NOTE — H&P (View-Only) (Signed)
Patient ID: KOURY RODDY, male   DOB: [birth year], 54 y.o.   MRN: [id number]    Subjective:  Denies SSCP, palpitations or Dyspnea Goes to gym can walk on treadmill  Objective:  Filed Vitals:   09/04/14 1811 09/04/14 1845 09/04/14 2043 09/05/14 0500  BP: 111/61 123/78 137/86 125/79  Pulse: 61 65 61 58  Temp:   97.8 F (36.6 C) 98.1 F (36.7 C)  TempSrc:   Oral Oral  Resp: 17 17 18 19   Weight:   88.134 kg (194 lb 4.8 oz)   SpO2: 99% 97% 100% 99%    Intake/Output from previous day: No intake or output data in the 24 hours ending 09/05/14 0240  Physical Exam: Affect appropriate Healthy:  appears stated age HEENT: normal Neck supple with no adenopathy JVP normal no bruits no thyromegaly Lungs clear with no wheezing and good diaphragmatic motion Heart:  S1/S2 no murmur, no rub, gallop or click PMI normal Abdomen: benighn, BS positve, no tenderness, no AAA no bruit.  No HSM or HJR Distal pulses intact with no bruits No edema Neuro non-focal Skin warm and dry No muscular weakness   Lab Results: Basic Metabolic Panel:  Recent Labs  09/04/14 1359  NA 138  K 4.1  CL 107  CO2 29  GLUCOSE 92  BUN 9  CREATININE 0.81  CALCIUM 9.2   CBC:  Recent Labs  09/04/14 1359 09/05/14 0527  WBC 5.6 5.3  HGB 13.7 14.4  HCT 41.1 43.1  MCV 84.9 84.0  PLT 230 239   Cardiac Enzymes:  Recent Labs  09/04/14 1359 09/05/14 0031  TROPONINI <0.03 <0.03    Imaging: Dg Chest 2 View  09/04/2014   CLINICAL DATA:  Chest tightness for 3 days.  Chest pain.  EXAM: CHEST  2 VIEW  COMPARISON:  07/18/2012  FINDINGS: Heart and mediastinal contours are within normal limits. No focal opacities or effusions. No acute bony abnormality. Cardiac stent again noted.  IMPRESSION: No active cardiopulmonary disease.   Electronically Signed   By: Rolm Baptise M.D.   On: 09/04/2014 14:20    Cardiac Studies:  ECG:  09/04/14 NSR normal ECG   Telemetry: Reviewed  NSR no arrhythmia     Medications:   . aspirin  325 mg Oral QPM  . carvedilol  12.5 mg Oral BID WC  . gabapentin  600 mg Oral QHS  . heparin subcutaneous  5,000 Units Subcutaneous 3 times per day  . isosorbide mononitrate  60 mg Oral Daily  . mirtazapine  30 mg Oral QHS  . omega-3 acid ethyl esters  1 g Oral Daily  . traMADol  50 mg Oral BID       Assessment/Plan:  Chest Pain: Last cardiac catheterization was on 12/14/2011 which showed minor irregularities in LAD, normal left main, 70% in-stent restenosis in mid left circumflex, intracoronary nitroglycerin injected in RCA for what appears to be spasm, EF 60%. The in-stent restenosis in the circumflex artery was treated with 3.015 mm DES, he also received a 3 mm x 12 mm long DES to proximal left circumflex.  Last stress test in 2013 showed EF 52%, mild perfusion defect in mid inferolateral and basal inferolateral region consistent with scar, overall considered low risk study. He also had a metabolic test in the office in February 2014 which showed good exercise effort, peak VO2 of 79% predicted with normal VO2 heart rate curves indicating only mild deconditioning but no evidence of ischemia. R/O no acute ECG changes pain  free this am DB has scheduled exercise myovue for this am  D/C home if non ischemic  Cath if ischemic  Continue beta blocker and nitrates  Jenkins Rouge 09/05/2014, 8:21 AM

## 2014-09-05 NOTE — CV Procedure (Signed)
Cardiac Catheterization Operative Report  [name] [id number] 3/9/20164:14 PM [name], FNP  Procedure Performed:  1. Left Heart Catheterization 2. Selective Coronary Angiography 3. Left ventricular angiogram 4. PTCA/DES x 1 proximal Circumflex 5. PTCA/cutting balloon angioplasty Obtuse marginal 1 6. Angioseal  Operator: Lauree Chandler, MD  Indication:  54 yo male with history of CAD with prior stenting of the Circumflex in 2011 and 2013 who is admitted with unstable angina.                             Procedure Details: The risks, benefits, complications, treatment options, and expected outcomes were discussed with the patient. The patient and/or family concurred with the proposed plan, giving informed consent. The patient was brought to the cath lab after IV hydration was begun and oral premedication was given. The patient was further sedated with Versed and Fentanyl. The right wrist was prepped and draped in a sterile fashion. I then placed a 5 French sheath in the right radial artery. 3 mg Verapamil was given through the sheath. I was unable to advance a wire into the ascending aorta so I decided to convert to femoral artery access. The right groin was prepped and draped in the usual manner. Using the modified Seldinger access technique, a 5 French sheath was placed in the right femoral artery. Standard diagnostic catheters were used to perform selective coronary angiography. A pigtail catheter was used to perform a left ventricular angiogram. I then upsized the sheath to a 6 [place] system. I elected to proceed to PCI of the Circumflex.   PCI Note: He was given a weight based bolus of Angiomax and a drip was started. He was given 600 mg Plavix po x 1. When the ACT was over 200, I engaged the left main with a XB 3.0 guiding catheter. I then passed a Cougar IC wire down the Circumflex into the first OM branch. I then used a 2.5 x 15 mm balloon and pre-dilated the  entire stented segment from the proximal Circumflex down into the distal segment of OM1. I then used a 3.0 x 10 mm cutting balloon and performed cutting balloon angioplasty of the entire stented segment of OM1 and the proximal Circumflex. There was not an optimal result in the proximal Circumflex extending into the OM1. I then used a 3.25 x 20 mm Collins balloon to dilate the proximal Circumflex and OM1 in the proximal and mid segment. I then placed a 3.5 x 32 mm Synergy DES in the proximal Circumflex extending down into OM1. The stent was post-dilated with a 3.5 x 20 mm [place] balloon x 1. The stenosis in the proximal Circumflex was taken from 99% down to 0%. The stenosis in OM1 was taken from 80% down to 0%.   There were no immediate complications. The patient was taken to the recovery area in stable condition.   Hemodynamic Findings: Central aortic pressure: 112/75 Left ventricular pressure: 112/10/25  Angiographic Findings:  Left main: No obstructive disease.   Left Anterior Descending Artery: Large caliber vessel that courses to the apex. 40% mid stenosis. Several small caliber diagonal branches.   Circumflex Artery: Large caliber vessel with stented segment extending from the proximal vessel down into the first OM branch and throughout the OM branch. There is severe restenosis in the proximal stented segment, 99%. There is diffuse 80% stenosis throughout the OM stented segment in the proximal and mid segments.  Right Coronary Artery: Large dominant vessel with patent mid stented segment with 20% diffuse in stent restenosis. Mild plaque distally.   Left Ventricular Angiogram: LVEF=50-55%.   Impression: 1. 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 Normal LV systolic function 3. Unstable angina 4. Successful PTCA/DES x 1 proximal Circumflex 5. Successful PTCA/cutting balloon angioplasty OM1  Recommendations: Will continue  ASA and Plavix, beta blocker and statin.        Complications:  None. The patient tolerated the procedure well.

## 2014-09-05 NOTE — Progress Notes (Signed)
Patient ID: [name], male   DOB: 11/02/1960, 54 y.o.   MRN: [id number]    Subjective:  Denies SSCP, palpitations or Dyspnea Goes to gym can walk on treadmill  Objective:  Filed Vitals:   09/04/14 1811 09/04/14 1845 09/04/14 2043 09/05/14 0500  BP: 111/61 123/78 137/86 125/79  Pulse: 61 65 61 58  Temp:   97.8 F (36.6 C) 98.1 F (36.7 C)  TempSrc:   Oral Oral  Resp: 17 17 18 19   Weight:   88.134 kg (194 lb 4.8 oz)   SpO2: 99% 97% 100% 99%    Intake/Output from previous day: No intake or output data in the 24 hours ending 09/05/14 1696  Physical Exam: Affect appropriate Healthy:  appears stated age HEENT: normal Neck supple with no adenopathy JVP normal no bruits no thyromegaly Lungs clear with no wheezing and good diaphragmatic motion Heart:  S1/S2 no murmur, no rub, gallop or click PMI normal Abdomen: benighn, BS positve, no tenderness, no AAA no bruit.  No HSM or HJR Distal pulses intact with no bruits No edema Neuro non-focal Skin warm and dry No muscular weakness   Lab Results: Basic Metabolic Panel:  Recent Labs  09/04/14 1359  NA 138  K 4.1  CL 107  CO2 29  GLUCOSE 92  BUN 9  CREATININE 0.81  CALCIUM 9.2   CBC:  Recent Labs  09/04/14 1359 09/05/14 0527  WBC 5.6 5.3  HGB 13.7 14.4  HCT 41.1 43.1  MCV 84.9 84.0  PLT 230 239   Cardiac Enzymes:  Recent Labs  09/04/14 1359 09/05/14 0031  TROPONINI <0.03 <0.03    Imaging: Dg Chest 2 View  09/04/2014   CLINICAL DATA:  Chest tightness for 3 days.  Chest pain.  EXAM: CHEST  2 VIEW  COMPARISON:  07/18/2012  FINDINGS: Heart and mediastinal contours are within normal limits. No focal opacities or effusions. No acute bony abnormality. Cardiac stent again noted.  IMPRESSION: No active cardiopulmonary disease.   Electronically Signed   By: Rolm Baptise M.D.   On: 09/04/2014 14:20    Cardiac Studies:  ECG:  09/04/14 NSR normal ECG   Telemetry: Reviewed  NSR no arrhythmia     Medications:   . aspirin  325 mg Oral QPM  . carvedilol  12.5 mg Oral BID WC  . gabapentin  600 mg Oral QHS  . heparin subcutaneous  5,000 Units Subcutaneous 3 times per day  . isosorbide mononitrate  60 mg Oral Daily  . mirtazapine  30 mg Oral QHS  . omega-3 acid ethyl esters  1 g Oral Daily  . traMADol  50 mg Oral BID       Assessment/Plan:  Chest Pain: Last cardiac catheterization was on 12/14/2011 which showed minor irregularities in LAD, normal left main, 70% in-stent restenosis in mid left circumflex, intracoronary nitroglycerin injected in RCA for what appears to be spasm, EF 60%. The in-stent restenosis in the circumflex artery was treated with 3.015 mm DES, he also received a 3 mm x 12 mm long DES to proximal left circumflex.  Last stress test in 2013 showed EF 52%, mild perfusion defect in mid inferolateral and basal inferolateral region consistent with scar, overall considered low risk study. He also had a metabolic test in the office in February 2014 which showed good exercise effort, peak VO2 of 79% predicted with normal VO2 heart rate curves indicating only mild deconditioning but no evidence of ischemia. R/O no acute ECG changes pain  free this am DB has scheduled exercise myovue for this am  D/C home if non ischemic  Cath if ischemic  Continue beta blocker and nitrates  Jenkins Rouge 09/05/2014, 8:21 AM

## 2014-09-05 NOTE — Progress Notes (Signed)
Pt exercised 5:45 of Bruce to a max HR of 111. He developed mid sternal chest tightness and burning consistent with angina. Test stopped and his symptoms improved with SL NTG. Discussed with Dr Johnsie Cancel, plan cath today. Pt has a shellfish allergy "hives" will pre medicate prior to cath.   Kerin Ransom PA-C 09/05/2014 11:27 AM

## 2014-09-05 NOTE — Progress Notes (Signed)
Pt c/o chest pain during treadmill portion sl ntg given with relief of pain, Levada Dy RN updated on pt status

## 2014-09-06 ENCOUNTER — Encounter (HOSPITAL_COMMUNITY): Payer: Self-pay | Admitting: Physician Assistant

## 2014-09-06 DIAGNOSIS — I209 Angina pectoris, unspecified: Secondary | ICD-10-CM

## 2014-09-06 DIAGNOSIS — E785 Hyperlipidemia, unspecified: Secondary | ICD-10-CM

## 2014-09-06 LAB — CBC
HEMATOCRIT: 44.2 % (ref 39.0–52.0)
Hemoglobin: 15.6 g/dL (ref 13.0–17.0)
MCH: 29.2 pg (ref 26.0–34.0)
MCHC: 35.3 g/dL (ref 30.0–36.0)
MCV: 82.6 fL (ref 78.0–100.0)
Platelets: 274 10*3/uL (ref 150–400)
RBC: 5.35 MIL/uL (ref 4.22–5.81)
RDW: 12.7 % (ref 11.5–15.5)
WBC: 14.5 10*3/uL — AB (ref 4.0–10.5)

## 2014-09-06 LAB — HEPATIC FUNCTION PANEL
ALT: 51 U/L (ref 0–53)
AST: 24 U/L (ref 0–37)
Albumin: 4.1 g/dL (ref 3.5–5.2)
Alkaline Phosphatase: 65 U/L (ref 39–117)
TOTAL PROTEIN: 7.2 g/dL (ref 6.0–8.3)
Total Bilirubin: 0.6 mg/dL (ref 0.3–1.2)

## 2014-09-06 LAB — BASIC METABOLIC PANEL
Anion gap: 6 (ref 5–15)
BUN: 9 mg/dL (ref 6–23)
CHLORIDE: 107 mmol/L (ref 96–112)
CO2: 22 mmol/L (ref 19–32)
Calcium: 9.3 mg/dL (ref 8.4–10.5)
Creatinine, Ser: 0.81 mg/dL (ref 0.50–1.35)
GFR calc Af Amer: >90 mL/min (ref >90)
GFR calc non Af Amer: >90 mL/min (ref >90)
Glucose, Bld: 145 mg/dL — ABNORMAL HIGH (ref 70–99)
POTASSIUM: 4.4 mmol/L (ref 3.5–5.1)
SODIUM: 135 mmol/L (ref 135–145)

## 2014-09-06 LAB — GLUCOSE, CAPILLARY: Glucose-Capillary: 144 mg/dL — ABNORMAL HIGH (ref 70–99)

## 2014-09-06 MED ORDER — LISINOPRIL 5 MG PO TABS
5.0000 mg | ORAL_TABLET | Freq: Every day | ORAL | Status: DC
  Administered 2014-09-06: 10:00:00 5 mg via ORAL
  Filled 2014-09-06: qty 1

## 2014-09-06 MED ORDER — ASPIRIN 81 MG PO TBEC: 81.0000 mg | DELAYED_RELEASE_TABLET | Freq: Every day | ORAL | Status: DC

## 2014-09-06 MED ORDER — CLOPIDOGREL BISULFATE 75 MG PO TABS: 75.0000 mg | ORAL_TABLET | Freq: Every day | ORAL | Status: DC

## 2014-09-06 MED FILL — Sodium Chloride IV Soln 0.9%: INTRAVENOUS | Qty: 50 | Status: AC

## 2014-09-06 NOTE — Clinical Social Work Note (Signed)
Patient medically stable for discharge home today. Request by nurse for patient to transport home by cab as patient has bags with him to carry.  Lakeshia Dohner Givens, MSW, LCSW Licensed Clinical Social Worker [place] [phone]

## 2014-09-06 NOTE — Progress Notes (Signed)
Patient: [name] / Admit Date: 09/04/2014 / Date of Encounter: 09/06/2014, 7:08 AM   Subjective: Feeling great. No CP, SOB.   Objective: Telemetry: NSR/SA Physical Exam: Blood pressure 140/90, pulse 74, temperature 97.8 F (36.6 C), temperature source Oral, resp. rate 20, weight 191 lb 12.8 oz (87 kg), SpO2 97 %. General: Well developed, well nourished AAM, in no acute distress. Head: Normocephalic, atraumatic, sclera non-icteric, no xanthomas, nares are without discharge. Neck: Negative for carotid bruits. JVP not elevated. Lungs: Clear bilaterally to auscultation without wheezes, rales, or rhonchi. Breathing is unlabored. Heart: RRR S1 S2 without murmurs, rubs, or gallops.  Abdomen: Soft, non-tender, non-distended with normoactive bowel sounds. No rebound/guarding. Extremities: No clubbing or cyanosis. No edema. Distal pedal pulses are 2+ and equal bilaterally. R radial site no hematoma, ecchymosis; good pulse. R femoral groin site without hematoma, ecchymosis, or bruit. Dried blood at site but no active bleeding. Neuro: Alert and oriented X 3. Moves all extremities spontaneously. Psych:  Responds to questions appropriately with a normal affect.   Intake/Output Summary (Last 24 hours) at 09/06/14 0708 Last data filed at 09/06/14 0530  Gross per 24 hour  Intake 356.67 ml  Output   2775 ml  Net -2418.33 ml    Inpatient Medications:  . aspirin  81 mg Oral Daily  . carvedilol  12.5 mg Oral BID WC  . clopidogrel  75 mg Oral Q breakfast  . gabapentin  600 mg Oral QHS  . isosorbide mononitrate  60 mg Oral Daily  . mirtazapine  30 mg Oral QHS  . omega-3 acid ethyl esters  1 g Oral Daily  . traMADol  50 mg Oral BID   Infusions:    Labs:  Recent Labs  09/04/14 1359 09/05/14 1312  NA 138 138  K 4.1 4.1  CL 107 102  CO2 29 31  GLUCOSE 92 107*  BUN 9 9  CREATININE 0.81 0.93  CALCIUM 9.2 9.3   No results for input(s): AST, ALT, ALKPHOS, BILITOT, PROT, ALBUMIN in the  last 72 hours.  Recent Labs  09/04/14 1359 09/05/14 0527  WBC 5.6 5.3  HGB 13.7 14.4  HCT 41.1 43.1  MCV 84.9 84.0  PLT 230 239    Recent Labs  09/04/14 1359 09/05/14 0031 09/05/14 1312  TROPONINI <0.03 <0.03 <0.03   Invalid input(s): POCBNP No results for input(s): HGBA1C in the last 72 hours.   Radiology/Studies:  Dg Chest 2 View  09/04/2014   CLINICAL DATA:  Chest tightness for 3 days.  Chest pain.  EXAM: CHEST  2 VIEW  COMPARISON:  07/18/2012  FINDINGS: Heart and mediastinal contours are within normal limits. No focal opacities or effusions. No acute bony abnormality. Cardiac stent again noted.  IMPRESSION: No active cardiopulmonary disease.   Electronically Signed   By: Rolm Baptise M.D.   On: 09/04/2014 14:20   Nm Myocar Single W/spect W/wall Motion And Ef  09/05/2014   CLINICAL DATA:  Chest pain. History of myocardial infarction, angioplasty, diabetes and hypertension.  EXAM: MYOCARDIAL IMAGING WITH SPECT (REST)  TECHNIQUE: Standard myocardial SPECT imaging was performed after resting intravenous injection of 10 mCi Tc-21m sestamibi. Quantitative gated imaging was not performed.  COMPARISON:  Chest radiographs 09/04/2014. Report only from prior nuclear medicine myocardial perfusion study 06/14/2002.  FINDINGS: Perfusion: No decreased activity in the left ventricle on rest imaging to strongly suggest reversible ischemia or infarction. Minimally decreased activity in the anterior septal region may be secondary to apical thinning.  Wall  Motion: Not evaluated.  Left Ventricular Ejection Fraction: Not obtained due to the lack of gated imaging.  IMPRESSION: Limited rest only study demonstrates no suspicious perfusion defects.   Electronically Signed   By: Richardean Sale M.D.   On: 09/05/2014 16:04     Assessment and Plan   1. CAD with unstable angina - history of multipe PCIs/microvascular angina, s/p PTCA/DES x 1 to prox Cx, PTCA/cutting balloon angioplasty OM1, EF 55-60% 2.  HTN 3. Hyperlipidemia LD 82 4. Diabetes mellitus  Continue CAD therapy including ASA, Plavix, BB. Add updated LFTs to baseline labs. H/o intolerance to statins in the past, was on pitavastatin at home which we will continue (not formulary here). BP slightly elevated this AM but prior values yesterday were in 1teen range. Resume home ACEI which should help. Anticipate DC if he does well with cardiac rehab. Educated regarding SL NTG and Revatio (must be separated by 48 hours).  Elevated WBC noted on AM labs but may be post-procedure. Afebrile and feeling great. Can recheck as outpatient.  Needs work note for volunteering stating what he had done. Will provide at DC.  Signed, Melina Copa PA-C   Patient seen and examined. Agree with assessment and plan.  Feels well; no chest pain. Groin cath site stable. DC today. F/U Dr. Debara Pickett.   Troy Sine, MD, [facility] 09/06/2014 8:49 AM

## 2014-09-06 NOTE — Progress Notes (Signed)
CARDIAC REHAB PHASE I   PRE:  Rate/Rhythm: 69 SR    BP: sitting 155/99    SaO2:   MODE:  Ambulation: 500 ft   POST:  Rate/Rhythm: 82 SR    BP: sitting 164/105     SaO2:   Tolerated well. BP elevated, he sts hasn't had meds. Reviewed ed. Pt very focused on diet and ex. Needs to start carrying NTG. Not interested in CRPII as he exercises on his own and did CRPII twice before.  [id number]  Josephina Shih [place] CES, ACSM 09/06/2014 10:00 AM

## 2014-09-06 NOTE — Discharge Summary (Signed)
Discharge Summary   Patient ID: [name] MRN: [id number], DOB/AGE: 1961-06-21 54 y.o. Admit date: 09/04/2014 D/C date:     09/06/2014  Primary Care Provider: [name], FNP Primary Cardiologist: [facility]  Primary Discharge Diagnoses:  1. CAD with unstable angina - history of multiple PCIs/microvascular angina, s/p PTCA/DES x 1 to prox Cx, PTCA/cutting balloon angioplasty OM1, EF 55-60% 2. HTN 3. Hyperlipidemia LDL 82 4. Diabetes mellitus 5. Erectile dysfunction - sildenafil stopped due to chronic Imdur use  Updated PMH: Past Medical History  Diagnosis Date  . Coronary artery disease     a. Multiple prior caths/PCI. Cath 2013 with possible spasm of RCA, 70% ISR of mid LCx with subsequent DES to mLCx and prox LCX. b. H/o microvascular angina. c. Recurrent angina 08/2014 - s/p PTCA/DES to prox Cx, PTCA/CBA to OM1.  . Hypertension   . Dyslipidemia     a. Intolerant to many statins except tolerating Livalo.  . S/P angioplasty with stent, DES, to proximal and mid LCX 12/15/11 12/15/2011  . Myocardial infarction ~ 2010  . Type II diabetes mellitus   . GERD (gastroesophageal reflux disease)   . Arthritis     "lower back; shoulders; neck"  . Chronic lower back pain     Hospital Course:  [name] is a 55 y/o M with history of HTN, HLD, DM, history of microvascular angina, CAD s/p multiple PCIs who presented to [facility] 09/04/14 with recurrent CP. According to record, According to record, patient had a cardiac catheterization in 2003, 2012 and 2013. His last cardiac catheterization was on 12/14/2011 which showed minor irregularities in LAD, normal left main, 70% in-stent restenosis in mid left circumflex, intracoronary nitroglycerin injected in RCA for what appears to be spasm, EF 60%. The in-stent restenosis in the circumflex artery was treated with 3.015 mm DES, he also received a 3 mm x 12 mm long DES to proximal left circumflex. Last stress test in 2013 showed EF 52%, mild perfusion defect  in mid inferolateral and basal inferolateral region consistent with scar, overall considered low risk study. He also had a metabolic test in the office in February 2014 which showed good exercise effort, peak VO2 of 79% predicted with normal VO2 heart rate curves indicating only mild deconditioning but no evidence of ischemia. He's made a lot of healthy lifestyle changes recently.  He presented with recurrent chest discomfort, somewhat atypical. He described this as a dull pain in substernal area lasting only seconds at a time and worse with inspiration. He had been taking SL NTG at home, however, because the chest discomfort was so short acting, it was usually gone before he took the nitroglycerin. He had increased frequency of symptoms so came to the ER. EKG showed normal sinus rhythm without significant ST-T wave changes. Cath versus stress test discussed with patient and he wanted to start with a stress test. He ruled out for MI. Stress test was attempted yesterday but this was clinically positive with mid sternal chest tightness and burning consistent with angina. Further imaging was forfeited and he subsequently underwent cath instead showing: Impression: 1. 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 Normal LV systolic function 3. Unstable angina 4. Successful PTCA/DES x 1 proximal Circumflex 5. Successful PTCA/cutting balloon angioplasty OM1 Left Ventricular Angiogram: LVEF=50-55%.   It was recommended to continue ASA, Plavix (new), beta blocker and statin. BP was mildly elevated this AM but he had been off  his home ACEI since admit for unclear reason. Anticipate it will improve with readdition. The patient has h/o statin intolerances but is tolerating Livalo well - LDL went from 140s on prior values to 82 on lipid check during this admission. Today his WBC has bumped to 14.5 but he received premedication yesterday for history  of shellfish allergy. Can consider repeating as outpatient. He is afebrile. He feels great. Of note the patient is on Imdur and was also taking sildenafil for erectile dysfunction PRN. Sildenafil was discontinued due to risk of hypotension. Dr. Claiborne Billings has seen and examined the patient today and feels he is stable for discharge.  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 He would like to gradually increase his activity before getting active with them again. He was advised not to resume metformin until after 6pm on 09/07/14 (48 hrs).  Discharge Vitals: Blood pressure 158/90, pulse 79, temperature 98.4 F (36.9 C), temperature source Oral, resp. rate 18, weight 191 lb 12.8 oz (87 kg), SpO2 97 %.  Labs: Lab Results  Component Value Date   WBC 14.5* 09/06/2014   HGB 15.6 09/06/2014   HCT 44.2 09/06/2014   MCV 82.6 09/06/2014   PLT 274 09/06/2014    Recent Labs Lab 09/06/14 0625 09/06/14 0635  NA  --  135  K  --  4.4  CL  --  107  CO2  --  22  BUN  --  9  CREATININE  --  0.81  CALCIUM  --  9.3  PROT 7.2  --   BILITOT 0.6  --   ALKPHOS 65  --   ALT 51  --   AST 24  --   GLUCOSE  --  145*    Recent Labs  09/04/14 1359 09/05/14 0031 09/05/14 1312  TROPONINI <0.03 <0.03 <0.03   Lab Results  Component Value Date   CHOL 153 09/05/2014   HDL 44 09/05/2014   LDLCALC 82 09/05/2014   TRIG 137 09/05/2014   Lab Results  Component Value Date   DDIMER <0.22 02/21/2011    Diagnostic Studies/Procedures   Dg Chest 2 View  09/04/2014   CLINICAL DATA:  Chest tightness for 3 days.  Chest pain.  EXAM: CHEST  2 VIEW  COMPARISON:  07/18/2012  FINDINGS: Heart and mediastinal contours are within normal limits. No focal opacities or effusions. No acute bony abnormality. Cardiac stent again noted.  IMPRESSION: No active cardiopulmonary disease.   Electronically Signed   By: Rolm Baptise M.D.   On: 09/04/2014 14:20   Nm Myocar  Single W/spect W/wall Motion And Ef  09/05/2014   CLINICAL DATA:  Chest pain. History of myocardial infarction, angioplasty, diabetes and hypertension.  EXAM: MYOCARDIAL IMAGING WITH SPECT (REST)  TECHNIQUE: Standard myocardial SPECT imaging was performed after resting intravenous injection of 10 mCi Tc-18m sestamibi. Quantitative gated imaging was not performed.  COMPARISON:  Chest radiographs 09/04/2014. Report only from prior nuclear medicine myocardial perfusion study 06/14/2002.  FINDINGS: Perfusion: No decreased activity in the left ventricle on rest imaging to strongly suggest reversible ischemia or infarction. Minimally decreased activity in the anterior septal region may be secondary to apical thinning.  Wall Motion: Not evaluated.  Left Ventricular Ejection Fraction: Not obtained due to the lack of gated imaging.  IMPRESSION: Limited rest only study demonstrates no suspicious perfusion defects.   Electronically Signed   By: Caryl Comes.D.  On: 09/05/2014 16:04   Cardiac catheterization this admission, please see full report and above for summary.  Discharge Medications   Current Discharge Medication List    START taking these medications   Details  clopidogrel (PLAVIX) 75 MG tablet Take 1 tablet (75 mg total) by mouth daily. Qty: 30 tablet, Refills: 11      CONTINUE these medications which have CHANGED   Details  aspirin 81 MG EC tablet Take 1 tablet (81 mg total) by mouth daily. Qty: 30 tablet, Refills: 11      CONTINUE these medications which have NOT CHANGED   Details  carvedilol (COREG) 12.5 MG tablet Take 12.5 mg by mouth 2 (two) times daily with a meal.    gabapentin (NEURONTIN) 300 MG capsule Take 2 capsules (600 mg total) by mouth at bedtime.    Associated Diagnoses: Diabetic polyneuropathy associated with diabetes mellitus due to underlying condition    isosorbide mononitrate (IMDUR) 60 MG 24 hr tablet Take 1 tablet (60 mg total) by mouth daily.     lisinopril  (PRINIVIL,ZESTRIL) 5 MG tablet Take 5 mg by mouth daily.    metFORMIN (GLUMETZA) 1000 MG (MOD) 24 hr tablet Take 1,000 mg by mouth daily with breakfast. Do not resume this medicine until the morning of 09/08/14.    mirtazapine (REMERON) 30 MG tablet Take 30 mg by mouth at bedtime.    Omega-3 Fatty Acids (FISH OIL PO) Take 1,200 mg by mouth daily.     traMADol (ULTRAM) 50 MG tablet Take 50 mg by mouth 2 (two) times daily. For pain    nitroGLYCERIN (NITROSTAT) 0.4 MG SL tablet Place 1 tablet (0.4 mg total) under the tongue every 5 (five) minutes as needed. For chest pain     Pitavastatin Calcium 4 MG TABS Take 1 tablet (4 mg total) by mouth daily.       STOP taking these medications     metFORMIN (GLUCOPHAGE) 1000 MG tablet      sildenafil (REVATIO) 20 MG tablet         Disposition   The patient will be discharged in stable condition to home. Discharge Instructions    Diet - low sodium heart healthy    Complete by:  As directed      Increase activity slowly    Complete by:  As directed   No driving for 2 days. No lifting over 5 lbs for 1 week. No sexual activity for 1 week. Keep procedure site clean & dry. If you notice increased pain, swelling, bleeding or pus, call/return!  You may shower, but no soaking baths/hot tubs/pools for 1 week.   Do not resume your Metformin until the AM of 09/08/14.  Your sildenafil was stopped completely due to risk of low blood pressure since you are on a long-acting nitroglycerin pill (Imdur).          Follow-up Information    Follow up with [facility], PA-C.   Specialty:  Cardiology   Why:  09/25/14 at 2:30pm   Contact information:   [address] [place] [address] [phone]         Duration of Discharge Encounter: Greater than 30 minutes including physician and PA time.  Signed, Melina Copa PA-C 09/06/2014, 11:24 AM

## 2014-09-25 ENCOUNTER — Ambulatory Visit: Payer: Medicaid Other | Admitting: Cardiology

## 2014-10-02 ENCOUNTER — Encounter: Payer: Self-pay | Admitting: Neurology

## 2014-10-02 ENCOUNTER — Ambulatory Visit (INDEPENDENT_AMBULATORY_CARE_PROVIDER_SITE_OTHER): Payer: Medicaid Other | Admitting: Neurology

## 2014-10-02 ENCOUNTER — Ambulatory Visit: Payer: Medicaid Other | Admitting: Nurse Practitioner

## 2014-10-02 VITALS — BP 126/80 | HR 64 | Resp 18 | Ht 68.0 in | Wt 185.0 lb

## 2014-10-02 DIAGNOSIS — Z955 Presence of coronary angioplasty implant and graft: Secondary | ICD-10-CM | POA: Diagnosis not present

## 2014-10-02 DIAGNOSIS — Z8679 Personal history of other diseases of the circulatory system: Secondary | ICD-10-CM | POA: Diagnosis not present

## 2014-10-02 DIAGNOSIS — E0842 Diabetes mellitus due to underlying condition with diabetic polyneuropathy: Secondary | ICD-10-CM

## 2014-10-02 DIAGNOSIS — G4700 Insomnia, unspecified: Secondary | ICD-10-CM

## 2014-10-02 MED ORDER — GABAPENTIN 300 MG PO CAPS: 600.0000 mg | ORAL_CAPSULE | Freq: Every day | ORAL | Status: DC

## 2014-10-02 NOTE — Patient Instructions (Addendum)
We will do another nerve and muscle test, called EMG/NCV test to look for evidence of nerve damage.  We will continue gabapentin 600 mg each night.   You can try Melatonin at night for sleep: take 3 to 5 mg one to 2 hours before your bedtime.
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
## 2014-10-10 ENCOUNTER — Encounter: Payer: Medicaid Other | Admitting: Neurology

## 2014-10-24 ENCOUNTER — Ambulatory Visit (INDEPENDENT_AMBULATORY_CARE_PROVIDER_SITE_OTHER): Payer: Medicaid Other | Admitting: Cardiology

## 2014-10-24 ENCOUNTER — Other Ambulatory Visit: Payer: Self-pay

## 2014-10-24 ENCOUNTER — Encounter: Payer: Self-pay | Admitting: Cardiology

## 2014-10-24 VITALS — BP 136/88 | HR 57 | Ht 68.0 in | Wt 185.9 lb

## 2014-10-24 DIAGNOSIS — R0789 Other chest pain: Secondary | ICD-10-CM

## 2014-10-24 MED ORDER — SILDENAFIL CITRATE 20 MG PO TABS: 20.0000 mg | ORAL_TABLET | Freq: Three times a day (TID) | ORAL | Status: DC

## 2014-10-24 MED ORDER — FAMOTIDINE 20 MG PO TABS
20.0000 mg | ORAL_TABLET | Freq: Two times a day (BID) | ORAL | Status: DC
Start: 2014-10-24 — End: 2014-12-18

## 2014-10-24 MED ORDER — FAMOTIDINE 20 MG PO TABS: 20.0000 mg | ORAL_TABLET | Freq: Two times a day (BID) | ORAL | Status: DC

## 2014-10-24 NOTE — Progress Notes (Signed)
Cardiology Office Note   Date:  10/24/2014   ID:  [name], DOB January 10, 1961, MRN [id number]  PCP:  [name], FNP  Cardiologist:  Dr. Debara Pickett    Chief Complaint  Patient presents with  . Hospitalization Follow-up    Cath, pt denied chest pain but c/o occassional discomfort in chest      History of Present Illness: [name] is a 54 y.o. male who presents for follow up after hospitalization for [place].    He has a history of HTN, HLD, DM, history of microvascular angina, CAD s/p multiple PCIs who presented to [facility] 09/04/14 with recurrent CP. According to record, According to record, patient had a cardiac catheterization in 2003, 2012 and 2013. His last cardiac catheterization was on 12/14/2011 which showed minor irregularities in LAD, normal left main, 70% in-stent restenosis in mid left circumflex, intracoronary nitroglycerin injected in RCA for what appears to be spasm, EF 60%. The in-stent restenosis in the circumflex artery was treated with 3.015 mm DES, he also received a 3 mm x 12 mm long DES to proximal left circumflex. Last stress test in 2013 showed EF 52%, mild perfusion defect in mid inferolateral and basal inferolateral region consistent with scar, overall considered low risk study. He also had a metabolic test in the office in February 2014 which showed good exercise effort, peak VO2 of 79% predicted with normal VO2 heart rate curves indicating only mild deconditioning but no evidence of ischemia. He's made a lot of healthy lifestyle changes recently.    Cath was done with:  1. 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 Normal LV systolic function 3. Unstable angina 4. Successful PTCA/DES x 1 proximal Circumflex 5. Successful PTCA/cutting balloon angioplasty OM1 Left Ventricular Angiogram: LVEF=50-55%.   He has done well with no further pain with exertion.  He has not taken the Imdur and has  done well without it.  He would like to back on sildenafil.  I agreed and he is aware of not taking within in 72 hours of NTG.  Today bp elevated but he has not taken his meds yet.  Only other complaint is indigestion after meals- we will add pepcid.   He is exercising and has decreased fried foods.    Past Medical History  Diagnosis Date  . Coronary artery disease     a. Multiple prior caths/PCI. Cath 2013 with possible spasm of RCA, 70% ISR of mid LCx with subsequent DES to mLCx and prox LCX. b. H/o microvascular angina. c. Recurrent angina 08/2014 - s/p PTCA/DES to prox Cx, PTCA/CBA to OM1.  . Hypertension   . Dyslipidemia     a. Intolerant to many statins except tolerating Livalo.  . S/P angioplasty with stent, DES, to proximal and mid LCX 12/15/11 12/15/2011  . Myocardial infarction ~ 2010  . Type II diabetes mellitus   . GERD (gastroesophageal reflux disease)   . Arthritis     "lower back; shoulders; neck"  . Chronic lower back pain     Past Surgical History  Procedure Laterality Date  . Excisional hemorrhoidectomy  1984  . Lipoma excision      back of the head  . Cardiometabolic testing  7/42/5956    good exercise effort, peak VO2 79% predicted with normal VO2 HR curves (mild deconditioning)  . Nm myocar perf wall motion  02/2012    lexiscan myoview; mild perfusion defect in mid inferolateral &  basal inferolateral region (infarct/scar); EF 52%, abnormal but ow risk scan  . Cardiac catheterization  06/15/2002    LAD with prox 40% stenosis, norma L main, Cfx with 25% lesion, RCA with long mid 25% stenosis (Dr. Vita Barley)  . Cardiac catheterization  04/01/2010    normal L main, LAD wit mild stenosis, L Cfx with 70% in-stent restenosis, RCA with 70% in-stent restenosis, LVEF >60% (Dr. K. Mali Hilty) - cutting ballon arthrectomy to RCA & Cfx (Dr. Rockne Menghini)  . Cardiac catheterization  08/25/2010    preserved global LV contractility; multivessel CAD, diffuse 90-95% in-stent restenosis  in prox placed Cfx stent - cutting balloon arthrectomy in Cfx with multiple dilatations 90-95% to 0% stenosis (Dr. Corky Downs)  . Cardiac catheterization  01/26/2011    PCI & stenting of aggresive in-stent restenosis within previously stented AV groove Cfx with 3.0x78mm Taxus DES (previous stents were Promus) (Dr. Adora Fridge)  . Cardiac catheterization  05/11/2011    preserved LV function, 40% mid LAD stenosis, 30-40% narrowing proximal to stented semgnet of prox Cfx, patent mid RCA stent with smooth 20% narrowing in distal RCA (Dr. Corky Downs)  . Cardiac catheterization  12/15/2011    PCI & stenting of proximal & mid Cfx with DES - 3.0x60mm in proximal, 3.0x72mm in mid (Dr. Adora Fridge)  . Left heart catheterization with coronary angiogram N/A 05/11/2011    Procedure: LEFT HEART CATHETERIZATION WITH CORONARY ANGIOGRAM;  Surgeon: Troy Sine, MD;  Location[facility] C[facility] LAB;  Service: Cardiovascular;  Laterality: N/A;  Possible percutaneous coronary intervention, possible IVUS  . Left heart catheterization with coronary angiogram N/A 12/15/2011    Procedure: LEFT HEART CATHETERIZATION WITH CORONARY ANGIOGRAM;  Surgeon: Lorretta Harp, MD;  Location: [facility] CATH LAB;  Service: Cardiovascular;  Laterality: N/A;  . Left heart catheterization with coronary angiogram N/A 09/05/2014    Procedure: LEFT HEART CATHETERIZATION WITH CORONARY ANGIOGRAM;  Surgeon: Burnell Blanks, MD;  Location: [facility] CATH LAB;  Service: Cardiovascular;  Laterality: N/A;  . Percutaneous coronary stent intervention (pci-s)  09/05/2014    Procedure: PERCUTANEOUS CORONARY STENT INTERVENTION (PCI-S);  Surgeon: Burnell Blanks, MD;  Location: [facility] CATH LAB;  Service: Cardiovascular;;     Current Outpatient Prescriptions  Medication Sig Dispense Refill  . aspirin 81 MG EC tablet Take 1 tablet (81 mg total) by mouth daily. 30 tablet 11  . carvedilol (COREG) 12.5 MG tablet Take 12.5 mg by mouth 2 (two) times daily with a meal.    .  clopidogrel (PLAVIX) 75 MG tablet Take 1 tablet (75 mg total) by mouth daily. 30 tablet 11  . gabapentin (NEURONTIN) 300 MG capsule Take 2 capsules (600 mg total) by mouth at bedtime. 180 capsule 3  . lisinopril (PRINIVIL,ZESTRIL) 5 MG tablet Take 5 mg by mouth daily.    . metFORMIN (GLUMETZA) 1000 MG (MOD) 24 hr tablet Take 1,000 mg by mouth daily with breakfast.    . mirtazapine (REMERON) 30 MG tablet Take 30 mg by mouth at bedtime.    . nitroGLYCERIN (NITROSTAT) 0.4 MG SL tablet Place 1 tablet (0.4 mg total) under the tongue every 5 (five) minutes as needed. For chest pain 25 tablet 3  . Omega-3 Fatty Acids (FISH OIL PO) Take 1,200 mg by mouth daily.     . Pitavastatin Calcium 4 MG TABS Take 1 tablet (4 mg total) by mouth daily. 28 tablet 0  . traMADol (ULTRAM) 50 MG tablet Take 50 mg by mouth 2 (two) times  daily. For pain    . isosorbide mononitrate (IMDUR) 60 MG 24 hr tablet Take 1 tablet (60 mg total) by mouth daily. (Patient not taking: Reported on 10/24/2014) 90 tablet 3   No current facility-administered medications for this visit.    Allergies:   Bee venom; Shellfish allergy; and Statins    Social History:  The patient  reports that he quit smoking about 5 years ago. His smoking use included Cigarettes. He has a 10 pack-year smoking history. He has never used smokeless tobacco. He reports that he uses illicit drugs (Cocaine). He reports that he does not drink alcohol.   Family History:  The patient's family history includes Cancer in his brother and paternal grandfather; Coronary artery disease in his paternal grandmother; Leukemia in his mother; Prostate cancer in his father.    ROS:  General:no colds or fevers, mild weight changes Skin:no rashes or ulcers HEENT:no blurred vision, no congestion CV:see HPI PUL:see HPI GI:no diarrhea constipation or melena, no indigestion GU:no hematuria, no dysuria MS:no joint pain, no claudication Neuro:no syncope, no lightheadedness Endo:+  diabetes, no thyroid disease  Wt Readings from Last 3 Encounters:  10/24/14 185 lb 14.4 oz (84.324 kg)  10/02/14 185 lb (83.915 kg)  09/06/14 191 lb 12.8 oz (87 kg)     PHYSICAL EXAM: VS:  BP 136/88 mmHg  Pulse 57  Ht 5\' 8"  (1.727 m)  Wt 185 lb 14.4 oz (84.324 kg)  BMI 28.27 kg/m2 , BMI Body mass index is 28.27 kg/(m^2). General:Pleasant affect, NAD Skin:Warm and dry, brisk capillary refill HEENT:normocephalic, sclera clear, mucus membranes moist Neck:supple, no JVD, no bruits  Heart:S1S2 RRR with occ premature beat, without murmur, gallup, rub or click Lungs:clear without rales, rhonchi, or wheezes ZOX:WRUE, non tender, + BS, do not palpate liver spleen or masses Ext:no lower ext edema, 2+ pedal pulses, 2+ radial pulses Neuro:alert and oriented, MAE, follows commands, + facial symmetry    EKG:  EKG is ordered today. The ekg ordered today demonstrates SB at 57 no acute changes, normal EKG.   Recent Labs: 09/06/2014: ALT 51; BUN 9; Creatinine 0.81; Hemoglobin 15.6; Platelets 274; Potassium 4.4; Sodium 135    Lipid Panel    Component Value Date/Time   CHOL 153 09/05/2014 1312   CHOL 226* 02/15/2014 0837   TRIG 137 09/05/2014 1312   TRIG 208* 02/15/2014 0837   HDL 44 09/05/2014 1312   HDL 42 02/15/2014 0837   CHOLHDL 3.5 09/05/2014 1312   VLDL 27 09/05/2014 1312   LDLCALC 82 09/05/2014 1312   LDLCALC 142* 02/15/2014 0837       Other studies Reviewed: Additional studies/ records that were reviewed today include: cath, hospital notes.   ASSESSMENT AND PLAN:  1. CAD with unstable angina - history of multiple PCIs/microvascular angina, s/p PTCA/DES x 1 to prox Cx, PTCA/cutting balloon angioplasty OM1, EF 55-60%.  No further angina, no pain or SOB.  Continue ASA and Plavix. Stop Imdur   Follow up with Dr. Debara Pickett in 6 months.  2. HTN- mildly elevated this AM but has not had meds.  3. Hyperlipidemia LDL 82 continue Livalo May need NMR lipoprofile in 6 months with Dr.  Debara Pickett   4. Diabetes mellitus followed by PCP  5. Erectile dysfunction - sildenafil  Had been stopped due to chronic Imdur use but pt has not been taking the Imdur and no angina.  He will resume the sildenafil and script called in for him.  He is aware of not taking within  72 hours of NTG.  We may need to revisit this issue if recurrent angina.   6. Indigestion occurs after meals.  Added pepcid today if this does not help would change to Protonix.      Current medicines are reviewed with the patient today.  The patient Has no concerns regarding medicines.  The following changes have been made:  See above Labs/ tests ordered today include:see above  Disposition:   FU:  see above  Signed, Isaiah Serge, NP  10/24/2014 9:08 AM    [address], [place], [place], [place] Phone: [phone]; Fax: [phone]

## 2014-10-24 NOTE — Telephone Encounter (Signed)
Rx(s) sent to pharmacy electronically.  

## 2014-10-24 NOTE — Patient Instructions (Signed)
Your physician wants you to follow-up in: 6 Months with Dr Theresa Duty will receive a reminder letter in the mail two months in advance. If you don't receive a letter, please call our office to schedule the follow-up appointment.  Your physician has recommended you make the following change in your medication: STOP Isosorbide and START Pepcid 20 mg daily

## 2014-11-16 ENCOUNTER — Encounter: Payer: Medicaid Other | Admitting: Neurology

## 2014-11-22 ENCOUNTER — Encounter: Payer: Self-pay | Admitting: Neurology

## 2014-11-23 ENCOUNTER — Telehealth: Payer: Self-pay

## 2014-11-23 NOTE — Telephone Encounter (Signed)
Left voicemail asking the patient call back to r/s 6/1 EMG/NCS appointment.

## 2014-11-28 ENCOUNTER — Encounter: Payer: Medicaid Other | Admitting: Neurology

## 2014-11-28 ENCOUNTER — Encounter: Payer: Self-pay | Admitting: Neurology

## 2014-11-28 ENCOUNTER — Telehealth: Payer: Self-pay

## 2014-11-28 NOTE — Telephone Encounter (Signed)
Patient did not come to EMG/NCV appointment today.

## 2014-11-29 ENCOUNTER — Encounter: Payer: Self-pay | Admitting: Neurology

## 2014-12-03 ENCOUNTER — Other Ambulatory Visit: Payer: Self-pay | Admitting: Internal Medicine

## 2014-12-04 NOTE — Telephone Encounter (Signed)
Rx has been sent to the pharmacy electronically. ° °

## 2014-12-10 ENCOUNTER — Other Ambulatory Visit: Payer: Self-pay | Admitting: Internal Medicine

## 2014-12-18 ENCOUNTER — Emergency Department (HOSPITAL_COMMUNITY)
Admission: EM | Admit: 2014-12-18 | Discharge: 2014-12-18 | Disposition: A | Discharge status: Home / Self Care | Payer: Medicaid Other | Attending: Emergency Medicine | Admitting: Emergency Medicine

## 2014-12-18 ENCOUNTER — Emergency Department (HOSPITAL_COMMUNITY): Payer: Medicaid Other

## 2014-12-18 ENCOUNTER — Encounter (HOSPITAL_COMMUNITY): Payer: Self-pay | Admitting: Emergency Medicine

## 2014-12-18 DIAGNOSIS — Z79899 Other long term (current) drug therapy: Secondary | ICD-10-CM | POA: Diagnosis not present

## 2014-12-18 DIAGNOSIS — Z8719 Personal history of other diseases of the digestive system: Secondary | ICD-10-CM | POA: Insufficient documentation

## 2014-12-18 DIAGNOSIS — I2510 Atherosclerotic heart disease of native coronary artery without angina pectoris: Secondary | ICD-10-CM | POA: Diagnosis not present

## 2014-12-18 DIAGNOSIS — L509 Urticaria, unspecified: Secondary | ICD-10-CM | POA: Diagnosis not present

## 2014-12-18 DIAGNOSIS — Z87891 Personal history of nicotine dependence: Secondary | ICD-10-CM | POA: Insufficient documentation

## 2014-12-18 DIAGNOSIS — I252 Old myocardial infarction: Secondary | ICD-10-CM | POA: Insufficient documentation

## 2014-12-18 DIAGNOSIS — E119 Type 2 diabetes mellitus without complications: Secondary | ICD-10-CM | POA: Diagnosis not present

## 2014-12-18 DIAGNOSIS — I10 Essential (primary) hypertension: Secondary | ICD-10-CM | POA: Insufficient documentation

## 2014-12-18 DIAGNOSIS — M159 Polyosteoarthritis, unspecified: Secondary | ICD-10-CM | POA: Insufficient documentation

## 2014-12-18 DIAGNOSIS — R0789 Other chest pain: Secondary | ICD-10-CM | POA: Insufficient documentation

## 2014-12-18 DIAGNOSIS — Z9861 Coronary angioplasty status: Secondary | ICD-10-CM | POA: Diagnosis not present

## 2014-12-18 DIAGNOSIS — Z7982 Long term (current) use of aspirin: Secondary | ICD-10-CM | POA: Insufficient documentation

## 2014-12-18 DIAGNOSIS — Z9889 Other specified postprocedural states: Secondary | ICD-10-CM | POA: Diagnosis not present

## 2014-12-18 DIAGNOSIS — R079 Chest pain, unspecified: Secondary | ICD-10-CM | POA: Diagnosis present

## 2014-12-18 DIAGNOSIS — E785 Hyperlipidemia, unspecified: Secondary | ICD-10-CM | POA: Diagnosis not present

## 2014-12-18 DIAGNOSIS — G8929 Other chronic pain: Secondary | ICD-10-CM | POA: Diagnosis not present

## 2014-12-18 LAB — CBC WITH DIFFERENTIAL/PLATELET
BASOS ABS: 0 10*3/uL (ref 0.0–0.1)
Basophils Relative: 0 % (ref 0–1)
Eosinophils Absolute: 0.3 10*3/uL (ref 0.0–0.7)
Eosinophils Relative: 4 % (ref 0–5)
HCT: 42.9 % (ref 39.0–52.0)
HEMOGLOBIN: 14.5 g/dL (ref 13.0–17.0)
LYMPHS PCT: 26 % (ref 12–46)
Lymphs Abs: 1.7 10*3/uL (ref 0.7–4.0)
MCH: 28.8 pg (ref 26.0–34.0)
MCHC: 33.8 g/dL (ref 30.0–36.0)
MCV: 85.3 fL (ref 78.0–100.0)
MONO ABS: 0.5 10*3/uL (ref 0.1–1.0)
Monocytes Relative: 7 % (ref 3–12)
NEUTROS ABS: 4.2 10*3/uL (ref 1.7–7.7)
Neutrophils Relative %: 63 % (ref 43–77)
Platelets: 263 10*3/uL (ref 150–400)
RBC: 5.03 MIL/uL (ref 4.22–5.81)
RDW: 12.9 % (ref 11.5–15.5)
WBC: 6.7 10*3/uL (ref 4.0–10.5)

## 2014-12-18 LAB — BASIC METABOLIC PANEL
ANION GAP: 10 (ref 5–15)
BUN: 21 mg/dL — ABNORMAL HIGH (ref 6–20)
CALCIUM: 9.2 mg/dL (ref 8.9–10.3)
CO2: 24 mmol/L (ref 22–32)
Chloride: 104 mmol/L (ref 101–111)
Creatinine, Ser: 1.31 mg/dL — ABNORMAL HIGH (ref 0.61–1.24)
Glucose, Bld: 120 mg/dL — ABNORMAL HIGH (ref 65–99)
POTASSIUM: 4.3 mmol/L (ref 3.5–5.1)
Sodium: 138 mmol/L (ref 135–145)

## 2014-12-18 LAB — I-STAT TROPONIN, ED: Troponin i, poc: 0 ng/mL (ref 0.00–0.08)

## 2014-12-18 LAB — TROPONIN I: Troponin I: <0.03 ng/mL (ref <0.031)

## 2014-12-18 MED ORDER — SODIUM CHLORIDE 0.9 % IV BOLUS (SEPSIS)
1000.0000 mL | Freq: Once | INTRAVENOUS | Status: AC
  Administered 2014-12-18: 1000 mL via INTRAVENOUS

## 2014-12-18 NOTE — Discharge Instructions (Signed)
As discussed, your evaluation today has been largely reassuring.  But, it is important that you monitor your condition carefully, and do not hesitate to return to the ED if you develop new, or concerning changes in your condition.  Recall that your creatinine, or measure of kidney function was elevated. It is important that you drink plenty of fluids, and have your blood levels checked again within 1 week.

## 2014-12-18 NOTE — ED Notes (Signed)
Pt to xray, no time to hook pt up to monitor or assess yet.

## 2014-12-18 NOTE — ED Notes (Signed)
MD at bedside. 

## 2014-12-18 NOTE — ED Notes (Signed)
Pt returned to room, monitor reapplied.   

## 2014-12-18 NOTE — ED Notes (Signed)
Pt sts right sided CP intermittently x 4 days; pt denies SOB

## 2014-12-18 NOTE — ED Notes (Signed)
Pain worse with palpation 

## 2014-12-18 NOTE — ED Notes (Signed)
Patient transported to X-ray 

## 2014-12-18 NOTE — ED Provider Notes (Signed)
CSN: [id number]     Arrival date & time 12/18/14  1619 History   First MD Initiated Contact with Patient 12/18/14 1650     Chief Complaint  Patient presents with  . Chest Pain     (Consider location/radiation/quality/duration/timing/severity/associated sxs/prior Treatment) HPI Patient presents with concern of chest pain. Past 4 days patient has had multiple episodes of brief right-sided non-radiating soreness, focally about the right parasternal area. There is no associated dyspnea, nor any exertional changes. Patient continues to exercise daily, has no fever, cough, syncope. Patient has a notable history of prior MI, stent, take Plavix as directed. He denies any recent weight gain, weight loss, nausea, vomiting. Past Medical History  Diagnosis Date  . Coronary artery disease     a. Multiple prior caths/PCI. Cath 2013 with possible spasm of RCA, 70% ISR of mid LCx with subsequent DES to mLCx and prox LCX. b. H/o microvascular angina. c. Recurrent angina 08/2014 - s/p PTCA/DES to prox Cx, PTCA/CBA to OM1.  . Hypertension   . Dyslipidemia     a. Intolerant to many statins except tolerating Livalo.  . S/P angioplasty with stent, DES, to proximal and mid LCX 12/15/11 12/15/2011  . Myocardial infarction ~ 2010  . Type II diabetes mellitus   . GERD (gastroesophageal reflux disease)   . Arthritis     "lower back; shoulders; neck"  . Chronic lower back pain    Past Surgical History  Procedure Laterality Date  . Excisional hemorrhoidectomy  1984  . Lipoma excision      back of the head  . Cardiometabolic testing  3/79/0240    good exercise effort, peak VO2 79% predicted with normal VO2 HR curves (mild deconditioning)  . Nm myocar perf wall motion  02/2012    lexiscan myoview; mild perfusion defect in mid inferolateral & basal inferolateral region (infarct/scar); EF 52%, abnormal but ow risk scan  . Cardiac catheterization  06/15/2002    LAD with prox 40% stenosis, norma L main, Cfx with  25% lesion, RCA with long mid 25% stenosis (Dr. Vita Barley)  . Cardiac catheterization  04/01/2010    normal L main, LAD wit mild stenosis, L Cfx with 70% in-stent restenosis, RCA with 70% in-stent restenosis, LVEF >60% (Dr. K. Mali Hilty) - cutting ballon arthrectomy to RCA & Cfx (Dr. Rockne Menghini)  . Cardiac catheterization  08/25/2010    preserved global LV contractility; multivessel CAD, diffuse 90-95% in-stent restenosis in prox placed Cfx stent - cutting balloon arthrectomy in Cfx with multiple dilatations 90-95% to 0% stenosis (Dr. Corky Downs)  . Cardiac catheterization  01/26/2011    PCI & stenting of aggresive in-stent restenosis within previously stented AV groove Cfx with 3.0x10mm Taxus DES (previous stents were Promus) (Dr. Adora Fridge)  . Cardiac catheterization  05/11/2011    preserved LV function, 40% mid LAD stenosis, 30-40% narrowing proximal to stented semgnet of prox Cfx, patent mid RCA stent with smooth 20% narrowing in distal RCA (Dr. Corky Downs)  . Cardiac catheterization  12/15/2011    PCI & stenting of proximal & mid Cfx with DES - 3.0x25mm in proximal, 3.0x29mm in mid (Dr. Adora Fridge)  . Left heart catheterization with coronary angiogram N/A 05/11/2011    Procedure: LEFT HEART CATHETERIZATION WITH CORONARY ANGIOGRAM;  Surgeon: Troy Sine, MD;  Location: [facility] CATH LAB;  Service: Cardiovascular;  Laterality: N/A;  Possible percutaneous coronary intervention, possible IVUS  . Left heart catheterization with coronary angiogram N/A 12/15/2011    Procedure: LEFT  HEART CATHETERIZATION WITH CORONARY ANGIOGRAM;  Surgeon: Lorretta Harp, MD;  Location: [facility] CATH LAB;  Service: Cardiovascular;  Laterality: N/A;  . Left heart catheterization with coronary angiogram N/A 09/05/2014    Procedure: LEFT HEART CATHETERIZATION WITH CORONARY ANGIOGRAM;  Surgeon: Burnell Blanks, MD;  Location: [facility] CATH LAB;  Service: Cardiovascular;  Laterality: N/A;  . Percutaneous coronary stent intervention (pci-s)   09/05/2014    Procedure: PERCUTANEOUS CORONARY STENT INTERVENTION (PCI-S);  Surgeon: Burnell Blanks, MD;  Location: [facility] CATH LAB;  Service: Cardiovascular;;   Family History  Problem Relation Age of Onset  . Coronary artery disease Paternal Grandmother   . Leukemia Mother   . Prostate cancer Father   . Cancer Paternal Grandfather   . Cancer Brother    History  Substance Use Topics  . Smoking status: Former Smoker -- 1.00 packs/day for 10 years    Types: Cigarettes    Quit date: 04/07/2009  . Smokeless tobacco: Never Used  . Alcohol Use: No     Comment: 12/15/11 "til 2010, drank a pint of gin/day at least"    Review of Systems  Constitutional:       Per HPI, otherwise negative  HENT:       Per HPI, otherwise negative  Respiratory:       Per HPI, otherwise negative  Cardiovascular:       Per HPI, otherwise negative  Gastrointestinal: Negative for vomiting.  Endocrine:       Negative aside from HPI  Genitourinary:       Neg aside from HPI   Musculoskeletal:       Per HPI, otherwise negative  Skin: Negative.   Neurological: Negative for syncope.      Allergies  Bee venom; Shellfish allergy; and Statins  Home Medications   Prior to Admission medications   Medication Sig Start Date End Date Taking? Authorizing Provider  aspirin 81 MG EC tablet Take 1 tablet (81 mg total) by mouth daily. 09/06/14   Dayna N Dunn, PA-C  carvedilol (COREG) 12.5 MG tablet Take 12.5 mg by mouth 2 (two) times daily with a meal.    Historical Provider, MD  clopidogrel (PLAVIX) 75 MG tablet Take 1 tablet (75 mg total) by mouth daily. 09/06/14   Dayna N Dunn, PA-C  famotidine (PEPCID) 20 MG tablet Take 1 tablet (20 mg total) by mouth 2 (two) times daily. 10/24/14   Isaiah Serge, NP  gabapentin (NEURONTIN) 300 MG capsule Take 2 capsules (600 mg total) by mouth at bedtime. 10/02/14   [name], MD  isosorbide mononitrate (IMDUR) 60 MG 24 hr tablet TAKE ONE TABLET BY MOUTH DAILY 12/10/14    Pixie Casino, MD  lisinopril (PRINIVIL,ZESTRIL) 5 MG tablet Take 5 mg by mouth daily.    Historical Provider, MD  LIVALO 4 MG TABS TAKE ONE TABLET BY MOUTH ONCE DAILY 12/04/14   Pixie Casino, MD  metFORMIN (GLUMETZA) 1000 MG (MOD) 24 hr tablet Take 1,000 mg by mouth daily with breakfast.    Historical Provider, MD  mirtazapine (REMERON) 30 MG tablet Take 30 mg by mouth at bedtime.    Historical Provider, MD  nitroGLYCERIN (NITROSTAT) 0.4 MG SL tablet Place 1 tablet (0.4 mg total) under the tongue every 5 (five) minutes as needed. For chest pain 08/28/13   Pixie Casino, MD  Omega-3 Fatty Acids (FISH OIL PO) Take 1,200 mg by mouth daily.     Historical Provider, MD  Pitavastatin Calcium 4 MG  TABS Take 1 tablet (4 mg total) by mouth daily. 03/28/14   Pixie Casino, MD  sildenafil (REVATIO) 20 MG tablet Take 1 tablet (20 mg total) by mouth 3 (three) times daily. 10/24/14   Pixie Casino, MD  traMADol (ULTRAM) 50 MG tablet Take 50 mg by mouth 2 (two) times daily. For pain    Historical Provider, MD   BP 122/76 mmHg  Pulse 79  Temp(Src) 98.2 F (36.8 C) (Oral)  Resp 17  SpO2 99% Physical Exam  Constitutional: He is oriented to person, place, and time. He appears well-developed. No distress.  HENT:  Head: Normocephalic and atraumatic.  Eyes: Conjunctivae and EOM are normal.  Cardiovascular: Normal rate and regular rhythm.   Pulmonary/Chest: Effort normal. No stridor. No respiratory distress.    Abdominal: He exhibits no distension.  Musculoskeletal: He exhibits no edema.  Neurological: He is alert and oriented to person, place, and time.  Skin: Skin is warm and dry.     Psychiatric: He has a normal mood and affect.  Nursing note and vitals reviewed.   ED Course  Procedures (including critical care time) Labs Review Labs Reviewed  BASIC METABOLIC PANEL - Abnormal; Notable for the following:    Glucose, Bld 120 (*)    BUN 21 (*)    Creatinine, Ser 1.31 (*)    All other  components within normal limits  CBC WITH DIFFERENTIAL/PLATELET  Randolm Idol, ED    Imaging Review Dg Chest 2 View  12/18/2014   CLINICAL DATA:  Right-sided chest pain for 4 days  EXAM: CHEST - 2 VIEW  COMPARISON:  09/04/2014  FINDINGS: Cardiac shadow is stable. Diffuse coronary stenting is noted over the left cardiac border. The lungs are well aerated bilaterally. No focal infiltrate or sizable effusion is seen. No acute bony abnormality is noted.  IMPRESSION: No active disease.   Electronically Signed   By: Inez Catalina M.D.   On: 12/18/2014 16:55     EKG Interpretation   Date/Time:  Tuesday December 18 2014 16:22:48 EDT Ventricular Rate:  86 PR Interval:  136 QRS Duration: 78 QT Interval:  348 QTC Calculation: 416 R Axis:   26 Text Interpretation:  Normal sinus rhythm Nonspecific T wave abnormality  Abnormal ECG Sinus rhythm T wave abnormality Abnormal ekg Confirmed by  Carmin Muskrat  MD [phone]) on 12/18/2014 4:50:19 PM     Initial labs notable for cr up from prior  On repeat exam the patient appears calm, no active complaints. We discussed all findings.  I on repeat exam the patient states his symptoms have improved a voiced understanding of all results, will follow-up with primary care for repeat evaluation.  MDM  Patient with history of prior MI, stents now presents with episodic right-sided chest pain. Patient continues to exercise, has no exertional, positional, pleuritic pain. Low suspicion for ongoing coronary ischemia. Patient's labs are most notable for elevation in creatinine from baseline. Given the patient's prescription of persistent exercise, there suspicion for inflammatory process. Patient improved here with fluids, was discharged in stable condition to follow-up with cardiology, primary care.   Carmin Muskrat, MD 12/18/14 [phone]

## 2014-12-21 ENCOUNTER — Ambulatory Visit (INDEPENDENT_AMBULATORY_CARE_PROVIDER_SITE_OTHER): Payer: Self-pay | Admitting: Neurology

## 2014-12-21 ENCOUNTER — Ambulatory Visit (INDEPENDENT_AMBULATORY_CARE_PROVIDER_SITE_OTHER): Payer: Medicaid Other | Admitting: Neurology

## 2014-12-21 ENCOUNTER — Encounter: Payer: Self-pay | Admitting: Neurology

## 2014-12-21 DIAGNOSIS — E0842 Diabetes mellitus due to underlying condition with diabetic polyneuropathy: Secondary | ICD-10-CM | POA: Diagnosis not present

## 2014-12-21 DIAGNOSIS — I10 Essential (primary) hypertension: Secondary | ICD-10-CM

## 2014-12-21 NOTE — Progress Notes (Signed)
Please refer to EMG and nerve conduction study procedure note. 

## 2014-12-21 NOTE — Procedures (Signed)
     HISTORY:  [name] is a 54 year old gentleman with a history of diabetes and a two-year history of sensory dysesthesias and discomfort in the bottoms of the feet bilaterally, right equal to left. The patient does have chronic low back pain without radiation down either leg. He is being evaluated for the above symptoms.  NERVE CONDUCTION STUDIES:  Nerve conduction studies were performed on both lower extremities. The distal motor latencies and motor amplitudes for the peroneal and posterior tibial nerves were within normal limits. The nerve conduction velocities for these nerves were also normal. The H reflex latencies were normal. The sensory latencies for the peroneal nerves were within normal limits. The medial and lateral plantar sensory latencies were symmetric and normal bilaterally.   EMG STUDIES:  EMG study was performed on the right lower extremity:  The tibialis anterior muscle reveals 2 to 4K motor units with full recruitment. No fibrillations or positive waves were seen. The peroneus tertius muscle reveals 2 to 4K motor units with full recruitment. No fibrillations or positive waves were seen. The medial gastrocnemius muscle reveals 1 to 3K motor units with full recruitment. No fibrillations or positive waves were seen. The abductor hallucis muscle reveals 1 to 3K motor units with full recruitment. No fibrillations or positive waves were seen.  The patient refused further EMG evaluation.   IMPRESSION:  Nerve conduction studies done on both lower extremities were within normal limits. No evidence of a peripheral neuropathy is seen. There is no evidence of a tarsal tunnel syndrome. EMG evaluation of the right lower extremity was limited secondary to patient's refusal, but the study was unremarkable.  Jill Alexanders MD 12/21/2014 2:05 PM  [facility] [address] [place], [place] [address]  Phone [phone] Fax  [phone]

## 2014-12-21 NOTE — Progress Notes (Signed)
Quick Note:  Please call and advise the patient that the recent EMG and nerve conduction velocity test, which is the electrical nerve and muscle test we we performed, was reported as within normal limits. We checked for abnormal electrical discharges in the muscles or nerves and the report suggested normal findings. No further action is required on this test at this time. Please remind patient to keep any upcoming appointments or tests and to call us with any interim questions, concerns, problems or updates. Thanks,  [name], MD, PhD   ______

## 2014-12-24 ENCOUNTER — Other Ambulatory Visit: Payer: Self-pay

## 2014-12-24 ENCOUNTER — Telehealth: Payer: Self-pay

## 2014-12-24 NOTE — Telephone Encounter (Signed)
Patient is returning your call in regard to his test.  Thanks!

## 2014-12-24 NOTE — Telephone Encounter (Signed)
Called back but got VM. Left results of EMG/NCV on personal vm. Left call back number for further questions.

## 2014-12-24 NOTE — Telephone Encounter (Signed)
Left message to call back for test results. EMG and nerve conduction test was within normal limits. Dr. Rexene Alberts just recommends that he keep his f/u appt or call us if needed.

## 2014-12-24 NOTE — Telephone Encounter (Signed)
-----   Message from [name], MD sent at 12/21/2014  2:09 PM EDT ----- Please call and advise the patient that the recent EMG and nerve conduction velocity test, which is the electrical nerve and muscle test we we performed, was reported as within normal limits. We checked for abnormal electrical discharges in the muscles or nerves and the report suggested normal findings. No further action is required on this test at this time. Please remind patient to keep any upcoming appointments or tests and to call us with any interim questions, concerns, problems or updates. Thanks,  [name], MD, PhD

## 2015-02-06 ENCOUNTER — Other Ambulatory Visit: Payer: Self-pay | Admitting: Internal Medicine

## 2015-02-06 MED ORDER — CARVEDILOL 12.5 MG PO TABS: 12.5000 mg | ORAL_TABLET | Freq: Two times a day (BID) | ORAL | Status: DC

## 2015-02-06 NOTE — Telephone Encounter (Signed)
°  1. Which medications need to be refilled? Carvedilol-pt has appt for October  2. Which pharmacy is medication to be sent to?[phone]  3. Do they need a 30 day or 90 day supply? 90 and refills  4. Would they like a call back once the medication has been sent to the pharmacy? yes

## 2015-02-06 NOTE — Telephone Encounter (Signed)
Refill submitted to patient's preferred pharmacy. Informed patient via VM. 

## 2015-03-28 ENCOUNTER — Ambulatory Visit: Payer: Medicaid Other | Admitting: Internal Medicine

## 2015-04-02 ENCOUNTER — Ambulatory Visit: Payer: Medicaid Other | Admitting: Neurology

## 2015-04-08 ENCOUNTER — Telehealth: Payer: Self-pay | Admitting: Internal Medicine

## 2015-04-08 NOTE — Telephone Encounter (Signed)
Goes straight to VM. Left message to call back.

## 2015-04-08 NOTE — Telephone Encounter (Signed)
Pt is calling in wanting to speak with Dr. Debara Pickett or a nurse about receiving a letter of approval or referral from Dr.Hilty about entering an Inpatient Alcohol Rehab program. Please f/u  Thanks

## 2015-04-09 ENCOUNTER — Telehealth: Payer: Self-pay | Admitting: *Deleted

## 2015-04-09 NOTE — Telephone Encounter (Signed)
Faxed PA for livalo to [facility]

## 2015-04-09 NOTE — Telephone Encounter (Signed)
Patient needs OV with MD

## 2015-04-10 NOTE — Telephone Encounter (Signed)
Tried to contact pt to set up an appt so Dr.Hilty could sign off on him participating in a Alcohol Rehab program ,but his phone went straight to VM

## 2015-04-15 NOTE — Telephone Encounter (Signed)
Can this encounter be closed?

## 2015-04-18 ENCOUNTER — Ambulatory Visit: Payer: Medicaid Other | Admitting: Neurology

## 2015-04-21 ENCOUNTER — Emergency Department (HOSPITAL_COMMUNITY)
Admission: EM | Admit: 2015-04-21 | Discharge: 2015-04-21 | Disposition: A | Discharge status: Home / Self Care | Payer: Medicaid Other | Attending: Emergency Medicine | Admitting: Emergency Medicine

## 2015-04-21 ENCOUNTER — Encounter (HOSPITAL_COMMUNITY): Payer: Self-pay | Admitting: *Deleted

## 2015-04-21 ENCOUNTER — Emergency Department (HOSPITAL_COMMUNITY): Payer: Medicaid Other

## 2015-04-21 DIAGNOSIS — G8929 Other chronic pain: Secondary | ICD-10-CM | POA: Diagnosis not present

## 2015-04-21 DIAGNOSIS — Z7982 Long term (current) use of aspirin: Secondary | ICD-10-CM | POA: Insufficient documentation

## 2015-04-21 DIAGNOSIS — M5136 Other intervertebral disc degeneration, lumbar region: Secondary | ICD-10-CM | POA: Diagnosis not present

## 2015-04-21 DIAGNOSIS — Z9861 Coronary angioplasty status: Secondary | ICD-10-CM | POA: Diagnosis not present

## 2015-04-21 DIAGNOSIS — Z87891 Personal history of nicotine dependence: Secondary | ICD-10-CM | POA: Insufficient documentation

## 2015-04-21 DIAGNOSIS — Z79899 Other long term (current) drug therapy: Secondary | ICD-10-CM | POA: Insufficient documentation

## 2015-04-21 DIAGNOSIS — Z959 Presence of cardiac and vascular implant and graft, unspecified: Secondary | ICD-10-CM | POA: Diagnosis not present

## 2015-04-21 DIAGNOSIS — M542 Cervicalgia: Secondary | ICD-10-CM

## 2015-04-21 DIAGNOSIS — I252 Old myocardial infarction: Secondary | ICD-10-CM | POA: Insufficient documentation

## 2015-04-21 DIAGNOSIS — M5030 Other cervical disc degeneration, unspecified cervical region: Secondary | ICD-10-CM | POA: Diagnosis not present

## 2015-04-21 DIAGNOSIS — I10 Essential (primary) hypertension: Secondary | ICD-10-CM | POA: Diagnosis not present

## 2015-04-21 DIAGNOSIS — Z9889 Other specified postprocedural states: Secondary | ICD-10-CM | POA: Insufficient documentation

## 2015-04-21 DIAGNOSIS — M1990 Unspecified osteoarthritis, unspecified site: Secondary | ICD-10-CM | POA: Diagnosis not present

## 2015-04-21 DIAGNOSIS — M545 Low back pain, unspecified: Secondary | ICD-10-CM

## 2015-04-21 DIAGNOSIS — Z8719 Personal history of other diseases of the digestive system: Secondary | ICD-10-CM | POA: Diagnosis not present

## 2015-04-21 DIAGNOSIS — E119 Type 2 diabetes mellitus without complications: Secondary | ICD-10-CM | POA: Insufficient documentation

## 2015-04-21 DIAGNOSIS — Z7902 Long term (current) use of antithrombotics/antiplatelets: Secondary | ICD-10-CM | POA: Insufficient documentation

## 2015-04-21 NOTE — ED Notes (Signed)
Pt reports 4 days ago neck,back and spinal pain started. The pain is acute on chronic pain.

## 2015-04-21 NOTE — ED Notes (Signed)
Patient transported to X-ray 

## 2015-04-21 NOTE — ED Notes (Signed)
Declined W/C at D/C and was escorted to lobby by RN. 

## 2015-04-21 NOTE — Discharge Instructions (Signed)
Degenerative Disk Disease Degenerative disk disease is a condition caused by the changes that occur in spinal disks as you grow older. Spinal disks are soft and compressible disks located between the bones of your spine (vertebrae). These disks act like shock absorbers. Degenerative disk disease can affect the whole spine. However, the neck and lower back are most commonly affected. Many changes can occur in the spinal disks with aging, such as:  The spinal disks may dry and shrink.  Small tears may occur in the tough, outer covering of the disk (annulus).  The disk space may become smaller due to loss of water.  Abnormal growths in the bone (spurs) may occur. This can put pressure on the nerve roots exiting the spinal canal, causing pain.  The spinal canal may become narrowed. RISK FACTORS   Being overweight.  Having a family history of degenerative disk disease.  Smoking.  There is increased risk if you are doing heavy lifting or have a sudden injury. SIGNS AND SYMPTOMS  Symptoms vary from person to person and may include:  Pain that varies in intensity. Some people have no pain, while others have severe pain. The location of the pain depends on the part of your backbone that is affected.  You will have neck or arm pain if a disk in the neck area is affected.  You will have pain in your back, buttocks, or legs if a disk in the lower back is affected.  Pain that becomes worse while bending, reaching up, or with twisting movements.  Pain that may start gradually and then get worse as time passes. It may also start after a major or minor injury.  Numbness or tingling in the arms or legs. DIAGNOSIS  Your health care provider will ask you about your symptoms and about activities or habits that may cause the pain. He or she may also ask about any injuries, diseases, or treatments you have had. Your health care provider will examine you to check for the range of movement that is  possible in the affected area, to check for strength in your extremities, and to check for sensation in the areas of the arms and legs supplied by different nerve roots. You may also have:   An X-ray of the spine.  Other imaging tests, such as MRI. TREATMENT  Your health care provider will advise you on the best plan for treatment. Treatment may include:  Medicines.  Rehabilitation exercises. HOME CARE INSTRUCTIONS   Follow proper lifting and walking techniques as advised by your health care provider.  Maintain good posture.  Exercise regularly as advised by your health care provider.  Perform relaxation exercises.  Change your sitting, standing, and sleeping habits as advised by your health care provider.  Change positions frequently.  Lose weight or maintain a healthy weight as advised by your health care provider.  Do not use any tobacco products, including cigarettes, chewing tobacco, or electronic cigarettes. If you need help quitting, ask your health care provider.  Wear supportive footwear.  Take medicines only as directed by your health care provider. SEEK MEDICAL CARE IF:   Your pain does not go away within 1-4 weeks.  You have significant appetite or weight loss. SEEK IMMEDIATE MEDICAL CARE IF:   Your pain is severe.  You notice weakness in your arms, hands, or legs.  You begin to lose control of your bladder or bowel movements.  You have fevers or night sweats. MAKE SURE YOU:   Understand these  instructions.  Will watch your condition.  Will get help right away if you are not doing well or get worse.   This information is not intended to replace advice given to you by your health care provider. Make sure you discuss any questions you have with your health care provider.   Document Released: 04/12/2007 Document Revised: 07/06/2014 Document Reviewed: 10/17/2013 Elsevier Interactive Patient Education 2016 Elsevier Inc.  Chronic Pain Chronic pain  can be defined as pain that is off and on and lasts for 3-6 months or longer. Many things cause chronic pain, which can make it difficult to make a diagnosis. There are many treatment options available for chronic pain. However, finding a treatment that works well for you may require trying various approaches until the right one is found. Many people benefit from a combination of two or more types of treatment to control their pain. SYMPTOMS  Chronic pain can occur anywhere in the body and can range from mild to very severe. Some types of chronic pain include:  Headache.  Low back pain.  Cancer pain.  Arthritis pain.  Neurogenic pain. This is pain resulting from damage to nerves. People with chronic pain may also have other symptoms such as:  Depression.  Anger.  Insomnia.  Anxiety. DIAGNOSIS  Your health care provider will help diagnose your condition over time. In many cases, the initial focus will be on excluding possible conditions that could be causing the pain. Depending on your symptoms, your health care provider may order tests to diagnose your condition. Some of these tests may include:   Blood tests.   CT scan.   MRI.   X-rays.   Ultrasounds.   Nerve conduction studies.  You may need to see a specialist.  TREATMENT  Finding treatment that works well may take time. You may be referred to a pain specialist. He or she may prescribe medicine or therapies, such as:   Mindful meditation or yoga.  Shots (injections) of numbing or pain-relieving medicines into the spine or area of pain.  Local electrical stimulation.  Acupuncture.   Massage therapy.   Aroma, color, light, or sound therapy.   Biofeedback.   Working with a physical therapist to keep from getting stiff.   Regular, gentle exercise.   Cognitive or behavioral therapy.   Group support.  Sometimes, surgery may be recommended.  HOME CARE INSTRUCTIONS   Take all medicines as  directed by your health care provider.   Lessen stress in your life by relaxing and doing things such as listening to calming music.   Exercise or be active as directed by your health care provider.   Eat a healthy diet and include things such as vegetables, fruits, fish, and lean meats in your diet.   Keep all follow-up appointments with your health care provider.   Attend a support group with others suffering from chronic pain. SEEK MEDICAL CARE IF:   Your pain gets worse.   You develop a new pain that was not there before.   You cannot tolerate medicines given to you by your health care provider.   You have new symptoms since your last visit with your health care provider.  SEEK IMMEDIATE MEDICAL CARE IF:   You feel weak.   You have decreased sensation or numbness.   You lose control of bowel or bladder function.   Your pain suddenly gets much worse.   You develop shaking.  You develop chills.  You develop confusion.  You develop  chest pain.  You develop shortness of breath.  MAKE SURE YOU:  Understand these instructions.  Will watch your condition.  Will get help right away if you are not doing well or get worse.   This information is not intended to replace advice given to you by your health care provider. Make sure you discuss any questions you have with your health care provider.   Document Released: 03/07/2002 Document Revised: 02/15/2013 Document Reviewed: 12/09/2012 Elsevier Interactive Patient Education [employer].
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
## 2015-04-25 ENCOUNTER — Telehealth: Payer: Self-pay

## 2015-04-25 NOTE — Telephone Encounter (Signed)
Prior auth obtained for Livalo 4mg  through [facility]. PA #[id number] good for 1 year. Pharmacy notified.

## 2015-06-09 ENCOUNTER — Inpatient Hospital Stay (HOSPITAL_COMMUNITY)
Admission: EM | Admit: 2015-06-09 | Discharge: 2015-06-10 | DRG: 287 | Disposition: A | Discharge status: Home / Self Care | Payer: Medicaid Other | Attending: Internal Medicine | Admitting: Internal Medicine

## 2015-06-09 ENCOUNTER — Encounter (HOSPITAL_COMMUNITY): Payer: Self-pay | Admitting: Emergency Medicine

## 2015-06-09 ENCOUNTER — Emergency Department (HOSPITAL_COMMUNITY): Payer: Medicaid Other

## 2015-06-09 DIAGNOSIS — R079 Chest pain, unspecified: Secondary | ICD-10-CM | POA: Diagnosis present

## 2015-06-09 DIAGNOSIS — Z7982 Long term (current) use of aspirin: Secondary | ICD-10-CM

## 2015-06-09 DIAGNOSIS — Z9861 Coronary angioplasty status: Secondary | ICD-10-CM

## 2015-06-09 DIAGNOSIS — M1990 Unspecified osteoarthritis, unspecified site: Secondary | ICD-10-CM | POA: Diagnosis present

## 2015-06-09 DIAGNOSIS — Z955 Presence of coronary angioplasty implant and graft: Secondary | ICD-10-CM | POA: Diagnosis not present

## 2015-06-09 DIAGNOSIS — Z79899 Other long term (current) drug therapy: Secondary | ICD-10-CM

## 2015-06-09 DIAGNOSIS — G8929 Other chronic pain: Secondary | ICD-10-CM | POA: Diagnosis present

## 2015-06-09 DIAGNOSIS — I200 Unstable angina: Secondary | ICD-10-CM | POA: Diagnosis not present

## 2015-06-09 DIAGNOSIS — Z87891 Personal history of nicotine dependence: Secondary | ICD-10-CM | POA: Diagnosis not present

## 2015-06-09 DIAGNOSIS — E785 Hyperlipidemia, unspecified: Secondary | ICD-10-CM | POA: Diagnosis present

## 2015-06-09 DIAGNOSIS — I10 Essential (primary) hypertension: Secondary | ICD-10-CM | POA: Diagnosis present

## 2015-06-09 DIAGNOSIS — Z91041 Radiographic dye allergy status: Secondary | ICD-10-CM

## 2015-06-09 DIAGNOSIS — I25110 Atherosclerotic heart disease of native coronary artery with unstable angina pectoris: Principal | ICD-10-CM | POA: Diagnosis present

## 2015-06-09 DIAGNOSIS — M545 Low back pain: Secondary | ICD-10-CM | POA: Diagnosis present

## 2015-06-09 DIAGNOSIS — I252 Old myocardial infarction: Secondary | ICD-10-CM

## 2015-06-09 DIAGNOSIS — I25119 Atherosclerotic heart disease of native coronary artery with unspecified angina pectoris: Secondary | ICD-10-CM | POA: Diagnosis not present

## 2015-06-09 DIAGNOSIS — Z7902 Long term (current) use of antithrombotics/antiplatelets: Secondary | ICD-10-CM

## 2015-06-09 DIAGNOSIS — E119 Type 2 diabetes mellitus without complications: Secondary | ICD-10-CM | POA: Diagnosis present

## 2015-06-09 DIAGNOSIS — Z7984 Long term (current) use of oral hypoglycemic drugs: Secondary | ICD-10-CM | POA: Diagnosis not present

## 2015-06-09 DIAGNOSIS — E1159 Type 2 diabetes mellitus with other circulatory complications: Secondary | ICD-10-CM | POA: Diagnosis not present

## 2015-06-09 DIAGNOSIS — I2510 Atherosclerotic heart disease of native coronary artery without angina pectoris: Secondary | ICD-10-CM | POA: Diagnosis present

## 2015-06-09 DIAGNOSIS — K219 Gastro-esophageal reflux disease without esophagitis: Secondary | ICD-10-CM | POA: Diagnosis present

## 2015-06-09 DIAGNOSIS — N529 Male erectile dysfunction, unspecified: Secondary | ICD-10-CM | POA: Diagnosis present

## 2015-06-09 LAB — HEPATIC FUNCTION PANEL
ALK PHOS: 52 U/L (ref 38–126)
ALT: 32 U/L (ref 17–63)
AST: 24 U/L (ref 15–41)
Albumin: 4.4 g/dL (ref 3.5–5.0)
BILIRUBIN DIRECT: 0.2 mg/dL (ref 0.1–0.5)
BILIRUBIN INDIRECT: 0.9 mg/dL (ref 0.3–0.9)
Total Bilirubin: 1.1 mg/dL (ref 0.3–1.2)
Total Protein: 6.5 g/dL (ref 6.5–8.1)

## 2015-06-09 LAB — CBC
HCT: 42.2 % (ref 39.0–52.0)
Hemoglobin: 13.9 g/dL (ref 13.0–17.0)
MCH: 28.7 pg (ref 26.0–34.0)
MCHC: 32.9 g/dL (ref 30.0–36.0)
MCV: 87.2 fL (ref 78.0–100.0)
PLATELETS: 214 10*3/uL (ref 150–400)
RBC: 4.84 MIL/uL (ref 4.22–5.81)
RDW: 12.6 % (ref 11.5–15.5)
WBC: 4.3 10*3/uL (ref 4.0–10.5)

## 2015-06-09 LAB — BASIC METABOLIC PANEL
ANION GAP: 9 (ref 5–15)
BUN: 18 mg/dL (ref 6–20)
CHLORIDE: 106 mmol/L (ref 101–111)
CO2: 23 mmol/L (ref 22–32)
CREATININE: 0.9 mg/dL (ref 0.61–1.24)
Calcium: 9.2 mg/dL (ref 8.9–10.3)
Glucose, Bld: 122 mg/dL — ABNORMAL HIGH (ref 65–99)
POTASSIUM: 4.2 mmol/L (ref 3.5–5.1)
SODIUM: 138 mmol/L (ref 135–145)

## 2015-06-09 LAB — I-STAT TROPONIN, ED: Troponin i, poc: 0.01 ng/mL (ref 0.00–0.08)

## 2015-06-09 LAB — HEPARIN LEVEL (UNFRACTIONATED): Heparin Unfractionated: 0.13 IU/mL — ABNORMAL LOW (ref 0.30–0.70)

## 2015-06-09 LAB — MAGNESIUM: MAGNESIUM: 2 mg/dL (ref 1.7–2.4)

## 2015-06-09 LAB — RAPID URINE DRUG SCREEN, HOSP PERFORMED
Amphetamines: NOT DETECTED
Barbiturates: NOT DETECTED
Benzodiazepines: NOT DETECTED
COCAINE: NOT DETECTED
OPIATES: NOT DETECTED
TETRAHYDROCANNABINOL: NOT DETECTED

## 2015-06-09 LAB — TROPONIN I
Troponin I: <0.03 ng/mL (ref <0.031)
Troponin I: <0.03 ng/mL (ref <0.031)

## 2015-06-09 LAB — T4, FREE: Free T4: 0.92 ng/dL (ref 0.61–1.12)

## 2015-06-09 LAB — TSH: TSH: 1.228 u[IU]/mL (ref 0.350–4.500)

## 2015-06-09 LAB — GLUCOSE, CAPILLARY
GLUCOSE-CAPILLARY: 147 mg/dL — AB (ref 65–99)
Glucose-Capillary: 96 mg/dL (ref 65–99)

## 2015-06-09 MED ORDER — ASPIRIN EC 81 MG PO TBEC: 81.0000 mg | DELAYED_RELEASE_TABLET | Freq: Every day | ORAL | Status: DC

## 2015-06-09 MED ORDER — SODIUM CHLORIDE 0.9 % IJ SOLN: 3.0000 mL | INTRAMUSCULAR | Status: DC | PRN

## 2015-06-09 MED ORDER — FAMOTIDINE IN NACL 20-0.9 MG/50ML-% IV SOLN
20.0000 mg | INTRAVENOUS | Status: AC
  Administered 2015-06-10: 20 mg via INTRAVENOUS
  Filled 2015-06-09: qty 50

## 2015-06-09 MED ORDER — METHYLPREDNISOLONE SODIUM SUCC 125 MG IJ SOLR
125.0000 mg | INTRAMUSCULAR | Status: AC
  Administered 2015-06-10: 125 mg via INTRAVENOUS
  Filled 2015-06-09: qty 2

## 2015-06-09 MED ORDER — NITROGLYCERIN 2 % TD OINT
1.0000 [in_us] | TOPICAL_OINTMENT | Freq: Four times a day (QID) | TRANSDERMAL | Status: DC
  Filled 2015-06-09: qty 30

## 2015-06-09 MED ORDER — ASPIRIN 81 MG PO CHEW
324.0000 mg | CHEWABLE_TABLET | Freq: Once | ORAL | Status: AC
  Administered 2015-06-09: 324 mg via ORAL
  Filled 2015-06-09: qty 4

## 2015-06-09 MED ORDER — ACETAMINOPHEN 325 MG PO TABS: 650.0000 mg | ORAL_TABLET | ORAL | Status: DC | PRN

## 2015-06-09 MED ORDER — SODIUM CHLORIDE 0.9 % IV SOLN: 250.0000 mL | INTRAVENOUS | Status: DC | PRN

## 2015-06-09 MED ORDER — ASPIRIN 300 MG RE SUPP: 300.0000 mg | RECTAL | Status: AC

## 2015-06-09 MED ORDER — DIPHENHYDRAMINE HCL 50 MG/ML IJ SOLN
25.0000 mg | INTRAMUSCULAR | Status: AC
  Administered 2015-06-10: 25 mg via INTRAVENOUS
  Filled 2015-06-09: qty 1

## 2015-06-09 MED ORDER — ASPIRIN 81 MG PO CHEW: 324.0000 mg | CHEWABLE_TABLET | ORAL | Status: AC

## 2015-06-09 MED ORDER — SODIUM CHLORIDE 0.9 % WEIGHT BASED INFUSION
1.0000 mL/kg/h | INTRAVENOUS | Status: DC
  Administered 2015-06-10: 250 mL via INTRAVENOUS

## 2015-06-09 MED ORDER — MIRTAZAPINE 7.5 MG PO TABS
30.0000 mg | ORAL_TABLET | Freq: Every day | ORAL | Status: DC
  Administered 2015-06-09: 30 mg via ORAL
  Filled 2015-06-09: qty 4

## 2015-06-09 MED ORDER — HEPARIN BOLUS VIA INFUSION
2500.0000 [IU] | Freq: Once | INTRAVENOUS | Status: AC
  Administered 2015-06-09: 2500 [IU] via INTRAVENOUS
  Filled 2015-06-09: qty 2500

## 2015-06-09 MED ORDER — INSULIN ASPART 100 UNIT/ML ~~LOC~~ SOLN: 0.0000 [IU] | Freq: Every day | SUBCUTANEOUS | Status: DC

## 2015-06-09 MED ORDER — GABAPENTIN 300 MG PO CAPS
600.0000 mg | ORAL_CAPSULE | Freq: Every day | ORAL | Status: DC
  Administered 2015-06-09: 600 mg via ORAL
  Filled 2015-06-09: qty 2

## 2015-06-09 MED ORDER — CLOPIDOGREL BISULFATE 75 MG PO TABS
75.0000 mg | ORAL_TABLET | Freq: Every day | ORAL | Status: DC
  Administered 2015-06-09 – 2015-06-10 (×2): 75 mg via ORAL
  Filled 2015-06-09 (×2): qty 1

## 2015-06-09 MED ORDER — ASPIRIN 81 MG PO CHEW
81.0000 mg | CHEWABLE_TABLET | ORAL | Status: AC
  Administered 2015-06-10: 81 mg via ORAL
  Filled 2015-06-09: qty 1

## 2015-06-09 MED ORDER — INSULIN ASPART 100 UNIT/ML ~~LOC~~ SOLN
0.0000 [IU] | Freq: Three times a day (TID) | SUBCUTANEOUS | Status: DC
  Administered 2015-06-10: 2 [IU] via SUBCUTANEOUS

## 2015-06-09 MED ORDER — SODIUM CHLORIDE 0.9 % IV SOLN: INTRAVENOUS | Status: DC

## 2015-06-09 MED ORDER — NITROGLYCERIN 0.4 MG SL SUBL: 0.4000 mg | SUBLINGUAL_TABLET | SUBLINGUAL | Status: DC | PRN

## 2015-06-09 MED ORDER — HEPARIN (PORCINE) IN NACL 100-0.45 UNIT/ML-% IJ SOLN
1200.0000 [IU]/h | INTRAMUSCULAR | Status: DC
  Administered 2015-06-09 – 2015-06-10 (×2): 1200 [IU]/h via INTRAVENOUS
  Filled 2015-06-09: qty 250

## 2015-06-09 MED ORDER — SODIUM CHLORIDE 0.9 % IJ SOLN: 3.0000 mL | Freq: Two times a day (BID) | INTRAMUSCULAR | Status: DC

## 2015-06-09 MED ORDER — TRAMADOL HCL 50 MG PO TABS
50.0000 mg | ORAL_TABLET | Freq: Two times a day (BID) | ORAL | Status: DC
  Administered 2015-06-09 – 2015-06-10 (×2): 50 mg via ORAL
  Filled 2015-06-09 (×2): qty 1

## 2015-06-09 MED ORDER — SODIUM CHLORIDE 0.9 % WEIGHT BASED INFUSION: 3.0000 mL/kg/h | INTRAVENOUS | Status: DC

## 2015-06-09 MED ORDER — OMEGA-3-ACID ETHYL ESTERS 1 G PO CAPS
1.0000 g | ORAL_CAPSULE | Freq: Every day | ORAL | Status: DC
  Administered 2015-06-09 – 2015-06-10 (×2): 1 g via ORAL
  Filled 2015-06-09 (×2): qty 1

## 2015-06-09 MED ORDER — ONDANSETRON HCL 4 MG/2ML IJ SOLN: 4.0000 mg | Freq: Four times a day (QID) | INTRAMUSCULAR | Status: DC | PRN

## 2015-06-09 MED ORDER — ASPIRIN EC 81 MG PO TBEC
81.0000 mg | DELAYED_RELEASE_TABLET | Freq: Every day | ORAL | Status: DC
  Filled 2015-06-09: qty 1

## 2015-06-09 MED ORDER — HEPARIN (PORCINE) IN NACL 100-0.45 UNIT/ML-% IJ SOLN
1000.0000 [IU]/h | INTRAMUSCULAR | Status: DC
  Administered 2015-06-09: 1000 [IU]/h via INTRAVENOUS
  Filled 2015-06-09: qty 250

## 2015-06-09 MED ORDER — CARVEDILOL 12.5 MG PO TABS
12.5000 mg | ORAL_TABLET | Freq: Two times a day (BID) | ORAL | Status: DC
  Administered 2015-06-09 – 2015-06-10 (×3): 12.5 mg via ORAL
  Filled 2015-06-09 (×3): qty 1

## 2015-06-09 MED ORDER — LISINOPRIL 5 MG PO TABS
5.0000 mg | ORAL_TABLET | Freq: Every day | ORAL | Status: DC
  Administered 2015-06-09 – 2015-06-10 (×2): 5 mg via ORAL
  Filled 2015-06-09 (×2): qty 1

## 2015-06-09 MED ORDER — HEPARIN BOLUS VIA INFUSION
4000.0000 [IU] | Freq: Once | INTRAVENOUS | Status: AC
  Administered 2015-06-09: 4000 [IU] via INTRAVENOUS
  Filled 2015-06-09: qty 4000

## 2015-06-09 MED ORDER — PRAVASTATIN SODIUM 20 MG PO TABS
20.0000 mg | ORAL_TABLET | Freq: Every day | ORAL | Status: DC
  Administered 2015-06-09 – 2015-06-10 (×2): 20 mg via ORAL
  Filled 2015-06-09 (×2): qty 1

## 2015-06-09 NOTE — ED Notes (Signed)
Pt aware of urine sample needed, pt unable to go at this time. 

## 2015-06-09 NOTE — H&P (Signed)
[name] is an 54 y.o. male.    Primary Cardiologist: Dr. Debara Pickett  PCP:  [name], FNP  Chief Complaint: chest pain  HPI: 54 year old male with history of HTN, HLD, DM, history of microvascular angina, CAD s/p multiple PCIs who presented to [facility] 09/04/14 with recurrent CP. According to record and patient, he had a cardiac catheterization in 2003, 2012 and 2013. Cardiac catheterization was on 12/14/2011 which showed minor irregularities in LAD, normal left main, 70% in-stent restenosis in mid left circumflex, intracoronary nitroglycerin injected in RCA for what appears to be spasm, EF 60%. The in-stent restenosis in the circumflex artery was treated with 3.015 mm DES, he also received a 3 mm x 12 mm long DES to proximal left circumflex. Last stress test in 2013 showed EF 52%, mild perfusion defect in mid inferolateral and basal inferolateral region consistent with scar, overall considered low risk study. He also had a metabolic test in the office in February 2014 which showed good exercise effort, peak VO2 of 79% predicted with normal VO2 heart rate curves indicating only mild deconditioning but no evidence of ischemia  Last cath was 08/2014 with PCI: 1. 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 Normal LV systolic function 3. Unstable angina 4. Successful PTCA/DES x 1 proximal Circumflex 5. Successful PTCA/cutting balloon angioplasty OM1 Left Ventricular Angiogram: LVEF=50-55%.   Pt now presents with chest discomfort for last 2 weeks off and on, occurs with exertion. Once he stops exertion no pain.  Some pain atypical with only opening jar, but other is with climbing stairs.  No associated symptoms of nausea, SOB or diaphoresis.  Pt currently pain free.  He does state "that pain is similar to previous cardiac issues".  Troponin negative.  EKG SR no acute changes.     Past Medical History    Diagnosis Date  . Coronary artery disease     a. Multiple prior caths/PCI. Cath 2013 with possible spasm of RCA, 70% ISR of mid LCx with subsequent DES to mLCx and prox LCX. b. H/o microvascular angina. c. Recurrent angina 08/2014 - s/p PTCA/DES to prox Cx, PTCA/CBA to OM1.  . Hypertension   . Dyslipidemia     a. Intolerant to many statins except tolerating Livalo.  . S/P angioplasty with stent, DES, to proximal and mid LCX 12/15/11 12/15/2011  . Myocardial infarction ([place]) ~ 2010  . Type II diabetes mellitus ([place])   . GERD (gastroesophageal reflux disease)   . Arthritis     "lower back; shoulders; neck"  . Chronic lower back pain     Past Surgical History  Procedure Laterality Date  . Excisional hemorrhoidectomy  1984  . Lipoma excision      back of the head  . Cardiometabolic testing  6/60/6004    good exercise effort, peak VO2 79% predicted with normal VO2 HR curves (mild deconditioning)  . Nm myocar perf wall motion  02/2012    lexiscan myoview; mild perfusion defect in mid inferolateral & basal inferolateral region (infarct/scar); EF 52%, abnormal but ow risk scan  . Cardiac catheterization  06/15/2002    LAD with prox 40% stenosis, norma L main, Cfx with 25% lesion, RCA with long mid 25% stenosis (Dr. Vita Barley)  . Cardiac catheterization  04/01/2010    normal L main, LAD wit mild stenosis, L Cfx with 70% in-stent restenosis, RCA with 70% in-stent restenosis,  LVEF >60% (Dr. K. Mali Hilty) - cutting ballon arthrectomy to RCA & Cfx (Dr. Rockne Menghini)  . Cardiac catheterization  08/25/2010    preserved global LV contractility; multivessel CAD, diffuse 90-95% in-stent restenosis in prox placed Cfx stent - cutting balloon arthrectomy in Cfx with multiple dilatations 90-95% to 0% stenosis (Dr. Corky Downs)  . Cardiac catheterization  01/26/2011    PCI & stenting of aggresive in-stent restenosis within previously stented AV groove Cfx with 3.0x60m Taxus DES (previous stents were Promus) (Dr. JAdora Fridge  . Cardiac catheterization  05/11/2011    preserved LV function, 40% mid LAD stenosis, 30-40% narrowing proximal to stented semgnet of prox Cfx, patent mid RCA stent with smooth 20% narrowing in distal RCA (Dr. TCorky Downs  . Cardiac catheterization  12/15/2011    PCI & stenting of proximal & mid Cfx with DES - 3.0x187min proximal, 3.0x1553mn mid (Dr. J. Adora Fridge. Left heart catheterization with coronary angiogram N/A 05/11/2011    Procedure: LEFT HEART CATHETERIZATION WITH CORONARY ANGIOGRAM;  Surgeon: ThoTroy SineD;  [facility];  Service: Cardiovascular;  Laterality: N/A;  Possible percutaneous coronary intervention, possible IVUS  . Left heart catheterization with coronary angiogram N/A 12/15/2011    Procedure: LEFT HEART CATHETERIZATION WITH CORONARY ANGIOGRAM;  Surgeon: JonLorretta HarpD;  [facility];  Service: Cardiovascular;  Laterality: N/A;  . Left heart catheterization with coronary angiogram N/A 09/05/2014    Procedure: LEFT HEART CATHETERIZATION WITH CORONARY ANGIOGRAM;  Surgeon: ChrBurnell BlanksD;  [facility];  Service: Cardiovascular;  Laterality: N/A;  . Percutaneous coronary stent intervention (pci-s)  09/05/2014    Procedure: PERCUTANEOUS CORONARY STENT INTERVENTION (PCI-S);  Surgeon: ChrBurnell BlanksD;  [facility];  Service: Cardiovascular;;    Family History  Problem Relation Age of Onset  . Coronary artery disease Paternal Grandmother   . Leukemia Mother   . Prostate cancer Father   . Cancer Paternal Grandfather   . Cancer Brother    Social History:  reports that he quit smoking about 6 years ago. His smoking use included Cigarettes. He has a 10 pack-year smoking history. He has never used smokeless tobacco. He reports that he does not drink alcohol or use illicit drugs.  Allergies:  Allergies  Allergen Reactions  . Bee Venom Anaphylaxis and Hives  . Shellfish Allergy Anaphylaxis and Hives  .  Statins Other (See Comments)    Myalgias. Tolerating livalo.     OUTPATIENT MEDICATIONS: No current facility-administered medications on file prior to encounter.   Current Outpatient Prescriptions on File Prior to Encounter  Medication Sig Dispense Refill  . aspirin 81 MG EC tablet Take 1 tablet (81 mg total) by mouth daily. 30 tablet 11  . carvedilol (COREG) 12.5 MG tablet Take 1 tablet (12.5 mg total) by mouth 2 (two) times daily with a meal. 180 tablet 1  . clopidogrel (PLAVIX) 75 MG tablet Take 1 tablet (75 mg total) by mouth daily. 30 tablet 11  . gabapentin (NEURONTIN) 300 MG capsule Take 2 capsules (600 mg total) by mouth at bedtime. 180 capsule 3  . isosorbide mononitrate (IMDUR) 60 MG 24 hr tablet TAKE ONE TABLET BY MOUTH DAILY 90 tablet 1  . lisinopril (PRINIVIL,ZESTRIL) 5 MG tablet Take 5 mg by mouth daily.    . LMarland KitchenVALO 4 MG TABS TAKE ONE TABLET BY MOUTH ONCE DAILY 90 tablet 1  . metFORMIN (GLUMETZA) 1000 MG (MOD) 24 hr  tablet Take 1,000 mg by mouth daily with breakfast.    . mirtazapine (REMERON) 30 MG tablet Take 30 mg by mouth at bedtime.    . Omega-3 Fatty Acids (FISH OIL PO) Take 1,200 mg by mouth daily.     . traMADol (ULTRAM) 50 MG tablet Take 50 mg by mouth 2 (two) times daily. For pain    . nitroGLYCERIN (NITROSTAT) 0.4 MG SL tablet Place 1 tablet (0.4 mg total) under the tongue every 5 (five) minutes as needed. For chest pain 25 tablet 3   Pt is not on IMDUR.   Results for orders placed or performed during the hospital encounter of 06/09/15 (from the past 48 hour(s))  Basic metabolic panel     Status: Abnormal   Collection Time: 06/09/15  8:30 AM  Result Value Ref Range   Sodium 138 135 - 145 mmol/L   Potassium 4.2 3.5 - 5.1 mmol/L   Chloride 106 101 - 111 mmol/L   CO2 23 22 - 32 mmol/L   Glucose, Bld 122 (H) 65 - 99 mg/dL   BUN 18 6 - 20 mg/dL   Creatinine, Ser 0.90 0.61 - 1.24 mg/dL   Calcium 9.2 8.9 - 10.3 mg/dL   GFR calc non Af Amer >60 >60 mL/min   GFR  calc Af Amer >60 >60 mL/min    Comment: (NOTE) The eGFR has been calculated using the CKD EPI equation. This calculation has not been validated in all clinical situations. eGFR's persistently <60 mL/min signify possible Chronic Kidney Disease.    Anion gap 9 5 - 15  CBC     Status: None   Collection Time: 06/09/15  8:30 AM  Result Value Ref Range   WBC 4.3 4.0 - 10.5 K/uL   RBC 4.84 4.22 - 5.81 MIL/uL   Hemoglobin 13.9 13.0 - 17.0 g/dL   HCT 42.2 39.0 - 52.0 %   MCV 87.2 78.0 - 100.0 fL   MCH 28.7 26.0 - 34.0 pg   MCHC 32.9 30.0 - 36.0 g/dL   RDW 12.6 11.5 - 15.5 %   Platelets 214 150 - 400 K/uL  I-stat troponin, ED     Status: None   Collection Time: 06/09/15  8:35 AM  Result Value Ref Range   Troponin i, poc 0.01 0.00 - 0.08 ng/mL   Comment 3            Comment: Due to the release kinetics of cTnI, a negative result within the first hours of the onset of symptoms does not rule out myocardial infarction with certainty. If myocardial infarction is still suspected, repeat the test at appropriate intervals.    Dg Chest 2 View  06/09/2015  CLINICAL DATA:  Chest pain, coronary disease, prior coronary stents EXAM: CHEST  2 VIEW COMPARISON:  12/18/2014 FINDINGS: The heart size and mediastinal contours are within normal limits. Both lungs are clear. The visualized skeletal structures are unremarkable. Coronary stents noted. IMPRESSION: No active cardiopulmonary disease. Electronically Signed   By: Jerilynn Mages.  Shick M.D.   On: 06/09/2015 08:45    ROS: General:no colds or fevers, no weight changes Skin:no rashes or ulcers HEENT:no blurred vision, no congestion CV:see HPI--last lipids in 08/2014 with LDL 82 PUL:see HPI GI:no diarrhea constipation or melena, no indigestion GU:no hematuria, no dysuria MS:no joint pain, no claudication Neuro:no syncope, no lightheadedness Endo:+ diabetes-poorly controlled per pt, no thyroid disease   Blood pressure 125/91, pulse 68, temperature 97.5 F  (36.4 C), temperature source Oral, resp. rate  18, height 5' 8"  (1.727 m), weight 185 lb (83.915 kg), SpO2 97 %. PE: General:Pleasant affect, NAD Skin:Warm and dry, brisk capillary refill HEENT:normocephalic, sclera clear, mucus membranes moist Neck:supple, no JVD, no bruits  Heart:S1S2 RRR without murmur, gallup, rub or click, no chest wall tenderness to palpation Lungs:clear without rales, rhonchi, or wheezes ONG:EXBM, non tender, + BS, do not palpate liver spleen or masses Ext:no lower ext edema, 2+ pedal pulses, 2+ radial pulses Neuro:alert and oriented, MAE, follows commands, + facial symmetry    Assessment/Plan:  Principal Problem:   Chest pain with exertion - some atypical but similar to previous cardiac issues.  Will admit and follow troponin, may need cardiac cath  --last nuc done 08/2014 neg ischemia but did develop pain with the ETT and cath was done with severe LCX in stent restenosis from previous procedure.   --add IV heparin, NTG paste.    Active Problems:   Coronary atherosclerosis, with multiple PCIs LCX,OM1   DM2 (diabetes mellitus, type 2) ([place])   Hypertension   Hyperlipidemia   Allergy to contrast media (used for diagnostic x-rays)   [facility] R Nurse Practitioner Certified [place] Pager [phone] or after 5pm or weekends call [phone] 06/09/2015, 10:17 AM   Attending note:  Patient seen and examined. Reviewed records and discussed the case with Ms. Ingold NP. [name] has a long-standing history of CAD status post prior PCI's mainly in the circumflex/OM and RCA distribution. Most recent intervention was DES to the proximal circumflex and PTCA with cutting balloon to the OM1 in March of this year in the setting of progressive angina. He states that he has been in his usual state of health on medical therapy until the last few weeks. He has been experiencing increasing frequency exertional chest discomfort consistent with his  angina, activities such as straining or walking up steps tend to bring on symptoms.  On evaluation in the ER he is chest pain-free. Heart rate in the 50s in sinus rhythm and systolic blood pressure [id number]. Lungs are clear without labored breathing, cardiac exam reveals RRR without gallop or rub. He has no distal edema. Initial troponin I 0.01. Chest x-ray reports no acute process. CT shows sinus rhythm with nonspecific T-wave changes.  Accelerating exertional angina, reportedly similar to symptoms prior to his last intervention in March of this year. No clear evidence of ACS at this time by enzymes. Plan will be admission to the hospital in anticipation of a follow-up diagnostic cardiac catheterization tomorrow. He does have a contrast dye allergy, also states that he was not able to have radial access procedure done last time. Both groins will be prepped. Continue to cycle marked cardiac markers. Continue outpatient cardiac regimen which includes aspirin and Plavix.  Satira Sark, M.D., F.A.C.C.

## 2015-06-09 NOTE — ED Notes (Signed)
Pt c/o right sided chest pain ongoing for a couple of weeks. Pt denies any other symptoms or injury.

## 2015-06-09 NOTE — ED Notes (Signed)
Pt given water 

## 2015-06-09 NOTE — Progress Notes (Addendum)
ANTICOAGULATION CONSULT NOTE - Initial Consult  Pharmacy Consult for Heparin Indication: ACS/STEMI  Allergies  Allergen Reactions  . Bee Venom Anaphylaxis and Hives  . Shellfish Allergy Anaphylaxis and Hives  . Statins Other (See Comments)    Myalgias. Tolerating livalo.     Patient Measurements: Height: 5\' 8"  (172.7 cm) Weight: 185 lb (83.915 kg) IBW/kg (Calculated) : 68.4 Heparin Dosing Weight: 83.9  Vital Signs: Temp: 98 F (36.7 C) (12/11 1210) Temp Source: Oral (12/11 1210) BP: 127/89 mmHg (12/11 1210) Pulse Rate: 59 (12/11 1210)  Labs:  Recent Labs  06/09/15 0830  HGB 13.9  HCT 42.2  PLT 214  CREATININE 0.90    Estimated Creatinine Clearance: 100.2 mL/min (by C-G formula based on Cr of 0.9).   Medical History: Past Medical History  Diagnosis Date  . Coronary artery disease     a. Multiple prior caths/PCI. Cath 2013 with possible spasm of RCA, 70% ISR of mid LCx with subsequent DES to mLCx and prox LCX. b. H/o microvascular angina. c. Recurrent angina 08/2014 - s/p PTCA/DES to prox Cx, PTCA/CBA to OM1.  . Hypertension   . Dyslipidemia     a. Intolerant to many statins except tolerating Livalo.  . S/P angioplasty with stent, DES, to proximal and mid LCX 12/15/11 12/15/2011  . Myocardial infarction ([place]) ~ 2010  . Type II diabetes mellitus ([place])   . GERD (gastroesophageal reflux disease)   . Arthritis     "lower back; shoulders; neck"  . Chronic lower back pain     Medications:  Scheduled:  . aspirin  324 mg Oral NOW   Or  . aspirin  300 mg Rectal NOW  . [START ON 06/10/2015] aspirin  81 mg Oral Pre-Cath  . [START ON 06/10/2015] aspirin EC  81 mg Oral Daily  . carvedilol  12.5 mg Oral BID WC  . clopidogrel  75 mg Oral Daily  . [START ON 06/10/2015] diphenhydrAMINE  25 mg Intravenous Pre-Cath  . [START ON 06/10/2015] famotidine (PEPCID) IV  20 mg Intravenous Pre-Cath  . gabapentin  600 mg Oral QHS  . insulin aspart  0-5 Units Subcutaneous QHS  .  insulin aspart  0-9 Units Subcutaneous TID WC  . lisinopril  5 mg Oral Daily  . [START ON 06/10/2015] methylPREDNISolone (SOLU-MEDROL) injection  125 mg Intravenous Pre-Cath  . mirtazapine  30 mg Oral QHS  . nitroGLYCERIN  1 inch Topical 4 times per day  . omega-3 acid ethyl esters  1 g Oral Daily  . pravastatin  20 mg Oral q1800  . sodium chloride  3 mL Intravenous Q12H  . traMADol  50 mg Oral BID   Infusions:  . sodium chloride    . [START ON 06/10/2015] sodium chloride     Followed by  . [START ON 06/10/2015] sodium chloride     PRN: sodium chloride, acetaminophen, nitroGLYCERIN, nitroGLYCERIN, ondansetron (ZOFRAN) IV, sodium chloride  Assessment: 38 YOM with history of HTN, HLD, DM, history of microvascular angina, CAD s/p multiple PCIs presents with CP.  Pharmacy is consulted to dose heparin for ACS/STEMI/Chest Pain.  Hgb 13.9, Plts 214. No bleeding noted.  On ASA 81 mg, Plavix PTA   Goal of Therapy:  Heparin level 0.3-0.7 units/ml Monitor platelets by anticoagulation protocol: Yes   Plan:  Give 4000 units bolus x 1 Start heparin infusion at 1000 units/hr Check anti-Xa level in 6 hours and daily while on heparin Continue to monitor H&H and platelets  Monitor for s/sx of bleeding.  06/09/2015,12:19 PM Bennye Alm, PharmD Pharmacy Resident [phone]    Addendum: Initial heparin level is below goal at 0.13. Level drawn ~ 2 hours early so likely a little higher. Will re-bolus 2500 units x 1 and increase heparin drip to 1200 units/hr. Check another level in 6 hours.  Nena Jordan, PharmD, BCPS 06/09/2015, 5:14 PM

## 2015-06-09 NOTE — ED Provider Notes (Signed)
CSN: [id number]     Arrival date & time 06/09/15  [id number] History   First MD Initiated Contact with Patient 06/09/15 0813     Chief Complaint  Patient presents with  . Chest Pain     (Consider location/radiation/quality/duration/timing/severity/associated sxs/prior Treatment) The history is provided by the patient.     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 male who presents to the emergency department with chief complaint of exertional chest pain. He has known coronary artery disease, diabetes, hyperlipidemia, previous MI, and status post multiple stent placement. The patient states that he has had left-sided chest pain which she describes as sharp, pressure-like, lasting about 30 seconds that seems to be associated with exertion. Patient states he was on his second floor and it often happens when he is climbing the stairs. This morning the patient took out his trash and as he was walking back up the stairs, had the pain and came in to be evaluated. He denies any associated nausea, vomiting, diaphoresis. Patient states it does feel like his previous cardiac pain. He denies any recent URI symptoms. He denies any recent heavy lifting or muscular pain.  Past Medical History  Diagnosis Date  . Coronary artery disease     a. Multiple prior caths/PCI. Cath 2013 with possible spasm of RCA, 70% ISR of mid LCx with subsequent DES to mLCx and prox LCX. b. H/o microvascular angina. c. Recurrent angina 08/2014 - s/p PTCA/DES to prox Cx, PTCA/CBA to OM1.  . Hypertension   . Dyslipidemia     a. Intolerant to many statins except tolerating Livalo.  . S/P angioplasty with stent, DES, to proximal and mid LCX 12/15/11 12/15/2011  . Myocardial infarction ([place]) ~ 2010  . Type II diabetes mellitus ([place])   . GERD (gastroesophageal reflux disease)   . Arthritis     "lower back; shoulders; neck"  . Chronic lower back pain    Past Surgical History  Procedure Laterality Date  . Excisional  hemorrhoidectomy  1984  . Lipoma excision      back of the head  . Cardiometabolic testing  [id number]    good exercise effort, peak VO2 79% predicted with normal VO2 HR curves (mild deconditioning)  . Nm myocar perf wall motion  02/2012    lexiscan myoview; mild perfusion defect in mid inferolateral & basal inferolateral region (infarct/scar); EF 52%, abnormal but ow risk scan  . Cardiac catheterization  06/15/2002    LAD with prox 40% stenosis, norma L main, Cfx with 25% lesion, RCA with long mid 25% stenosis (Dr. Vita Barley)  . Cardiac catheterization  04/01/2010    normal L main, LAD wit mild stenosis, L Cfx with 70% in-stent restenosis, RCA with 70% in-stent restenosis, LVEF >60% (Dr. K. Mali Hilty) - cutting ballon arthrectomy to RCA & Cfx (Dr. Rockne Menghini)  . Cardiac catheterization  08/25/2010    preserved global LV contractility; multivessel CAD, diffuse 90-95% in-stent restenosis in prox placed Cfx stent - cutting balloon arthrectomy in Cfx with multiple dilatations 90-95% to 0% stenosis (Dr. Corky Downs)  . Cardiac catheterization  01/26/2011    PCI & stenting of aggresive in-stent restenosis within previously stented AV groove Cfx with 3.0x44mm Taxus DES (previous stents were Promus) (Dr. Adora Fridge)  . Cardiac catheterization  05/11/2011    preserved LV function, 40% mid LAD stenosis, 30-40% narrowing proximal to stented semgnet of prox Cfx, patent mid RCA stent with smooth 20% narrowing in distal RCA (Dr. Darene Lamer.  Claiborne Billings)  . Cardiac catheterization  12/15/2011    PCI & stenting of proximal & mid Cfx with DES - 3.0x45mm in proximal, 3.0x18mm in mid (Dr. Adora Fridge)  . Left heart catheterization with coronary angiogram N/A 05/11/2011    Procedure: LEFT HEART CATHETERIZATION WITH CORONARY ANGIOGRAM;  Surgeon: Troy Sine, MD;  Location: [facility] CATH LAB;  Service: Cardiovascular;  Laterality: N/A;  Possible percutaneous coronary intervention, possible IVUS  . Left heart catheterization with coronary  angiogram N/A 12/15/2011    Procedure: LEFT HEART CATHETERIZATION WITH CORONARY ANGIOGRAM;  Surgeon: Lorretta Harp, MD;  Location: [facility] CATH LAB;  Service: Cardiovascular;  Laterality: N/A;  . Left heart catheterization with coronary angiogram N/A 09/05/2014    Procedure: LEFT HEART CATHETERIZATION WITH CORONARY ANGIOGRAM;  Surgeon: Burnell Blanks, MD;  Location: [facility] CATH LAB;  Service: Cardiovascular;  Laterality: N/A;  . Percutaneous coronary stent intervention (pci-s)  09/05/2014    Procedure: PERCUTANEOUS CORONARY STENT INTERVENTION (PCI-S);  Surgeon: Burnell Blanks, MD;  Location: [facility] CATH LAB;  Service: Cardiovascular;;   Family History  Problem Relation Age of Onset  . Coronary artery disease Paternal Grandmother   . Leukemia Mother   . Prostate cancer Father   . Cancer Paternal Grandfather   . Cancer Brother    Social History  Substance Use Topics  . Smoking status: Former Smoker -- 1.00 packs/day for 10 years    Types: Cigarettes    Quit date: 04/07/2009  . Smokeless tobacco: Never Used  . Alcohol Use: No     Comment: 12/15/11 "til 2010, drank a pint of gin/day at least"    Review of Systems   Ten systems reviewed and are negative for acute change, except as noted in the HPI.     Allergies  Bee venom; Shellfish allergy; and Statins  Home Medications   Prior to Admission medications   Medication Sig Start Date End Date Taking? Authorizing Provider  aspirin 81 MG EC tablet Take 1 tablet (81 mg total) by mouth daily. 09/06/14  Yes Dayna N Dunn, PA-C  carvedilol (COREG) 12.5 MG tablet Take 1 tablet (12.5 mg total) by mouth 2 (two) times daily with a meal. 02/06/15  Yes Pixie Casino, MD  clopidogrel (PLAVIX) 75 MG tablet Take 1 tablet (75 mg total) by mouth daily. 09/06/14  Yes Dayna N Dunn, PA-C  gabapentin (NEURONTIN) 300 MG capsule Take 2 capsules (600 mg total) by mouth at bedtime. 10/02/14  Yes [name], MD  isosorbide mononitrate (IMDUR) 60 MG 24 hr  tablet TAKE ONE TABLET BY MOUTH DAILY 12/10/14  Yes Pixie Casino, MD  lisinopril (PRINIVIL,ZESTRIL) 5 MG tablet Take 5 mg by mouth daily.   Yes Historical Provider, MD  LIVALO 4 MG TABS TAKE ONE TABLET BY MOUTH ONCE DAILY 12/04/14  Yes Pixie Casino, MD  metFORMIN (GLUMETZA) 1000 MG (MOD) 24 hr tablet Take 1,000 mg by mouth daily with breakfast.   Yes Historical Provider, MD  mirtazapine (REMERON) 30 MG tablet Take 30 mg by mouth at bedtime.   Yes Historical Provider, MD  Omega-3 Fatty Acids (FISH OIL PO) Take 1,200 mg by mouth daily.    Yes Historical Provider, MD  traMADol (ULTRAM) 50 MG tablet Take 50 mg by mouth 2 (two) times daily. For pain   Yes Historical Provider, MD  nitroGLYCERIN (NITROSTAT) 0.4 MG SL tablet Place 1 tablet (0.4 mg total) under the tongue every 5 (five) minutes as needed. For chest pain 08/28/13  Pixie Casino, MD   BP 125/91 mmHg  Pulse 68  Temp(Src) 97.5 F (36.4 C) (Oral)  Resp 18  Ht 5\' 8"  (1.727 m)  Wt 83.915 kg  BMI 28.14 kg/m2  SpO2 97% Physical Exam  Constitutional: He appears well-developed and well-nourished. No distress.  HENT:  Head: Normocephalic and atraumatic.  Eyes: Conjunctivae are normal. No scleral icterus.  Neck: Normal range of motion. Neck supple.  Cardiovascular: Normal rate, regular rhythm and normal heart sounds.   Pulmonary/Chest: Effort normal and breath sounds normal. No respiratory distress. He exhibits no tenderness.  Abdominal: Soft. There is no tenderness.  Musculoskeletal: He exhibits no edema.  Neurological: He is alert.  Skin: Skin is warm and dry. He is not diaphoretic.  Psychiatric: His behavior is normal.  Nursing note and vitals reviewed.   ED Course  Procedures (including critical care time) Labs Review Labs Reviewed  BASIC METABOLIC PANEL - Abnormal; Notable for the following:    Glucose, Bld 122 (*)    All other components within normal limits  CBC  URINE RAPID DRUG SCREEN, HOSP PERFORMED  I-STAT  TROPOININ, ED    Imaging Review Dg Chest 2 View  06/09/2015  CLINICAL DATA:  Chest pain, coronary disease, prior coronary stents EXAM: CHEST  2 VIEW COMPARISON:  12/18/2014 FINDINGS: The heart size and mediastinal contours are within normal limits. Both lungs are clear. The visualized skeletal structures are unremarkable. Coronary stents noted. IMPRESSION: No active cardiopulmonary disease. Electronically Signed   By: Jerilynn Mages.  Shick M.D.   On: 06/09/2015 08:45   I have personally reviewed and evaluated these images and lab results as part of my medical decision-making.   EKG Interpretation   Date/Time:  Sunday June 09 2015 07:52:41 EST Ventricular Rate:  70 PR Interval:  140 QRS Duration: 80 QT Interval:  356 QTC Calculation: 384 R Axis:   9 Text Interpretation:  Normal sinus rhythm Nonspecific T wave abnormality  No significant change since last tracing Confirmed by Ashok Cordia  MD, Lennette Bihari  ([id number]) on 06/09/2015 8:56:43 AM      MDM   Final diagnoses:  Chest pain, unspecified chest pain type    9:29 AM BP 125/91 mmHg  Pulse 68  Temp(Src) 97.5 F (36.4 C) (Oral)  Resp 18  Ht 5\' 8"  (1.727 m)  Wt 83.915 kg  BMI 28.14 kg/m2  SpO2 97% Patient with Exertional chest pain and known CAD.  I have spoken with Dr. Duard Brady  Of cardiology who will consult on the patient here in the ED  Patient will be admitted by cardilolgy for cp rule out. He is stable throughout his visit. ekg unchanged. Neg cxr and trop.   Margarita Mail, PA-C 06/09/15 [place], MD 06/09/15 [phone]

## 2015-06-10 ENCOUNTER — Encounter (HOSPITAL_COMMUNITY): Payer: Self-pay | Admitting: Internal Medicine

## 2015-06-10 ENCOUNTER — Encounter (HOSPITAL_COMMUNITY)
Admission: EM | Disposition: A | Discharge status: Home / Self Care | Payer: Self-pay | Source: Home / Self Care | Attending: Cardiology

## 2015-06-10 DIAGNOSIS — I25110 Atherosclerotic heart disease of native coronary artery with unstable angina pectoris: Principal | ICD-10-CM

## 2015-06-10 DIAGNOSIS — E785 Hyperlipidemia, unspecified: Secondary | ICD-10-CM

## 2015-06-10 DIAGNOSIS — E1159 Type 2 diabetes mellitus with other circulatory complications: Secondary | ICD-10-CM

## 2015-06-10 DIAGNOSIS — I200 Unstable angina: Secondary | ICD-10-CM

## 2015-06-10 HISTORY — PX: CARDIAC CATHETERIZATION: SHX172

## 2015-06-10 LAB — BASIC METABOLIC PANEL
ANION GAP: 8 (ref 5–15)
BUN: 13 mg/dL (ref 6–20)
CHLORIDE: 108 mmol/L (ref 101–111)
CO2: 24 mmol/L (ref 22–32)
Calcium: 9.3 mg/dL (ref 8.9–10.3)
Creatinine, Ser: 0.86 mg/dL (ref 0.61–1.24)
GFR calc non Af Amer: >60 mL/min (ref >60)
Glucose, Bld: 125 mg/dL — ABNORMAL HIGH (ref 65–99)
Potassium: 4.1 mmol/L (ref 3.5–5.1)
Sodium: 140 mmol/L (ref 135–145)

## 2015-06-10 LAB — LIPID PANEL
CHOLESTEROL: 132 mg/dL (ref 0–200)
HDL: 42 mg/dL (ref >40)
LDL Cholesterol: 66 mg/dL (ref 0–99)
TRIGLYCERIDES: 122 mg/dL (ref <150)
Total CHOL/HDL Ratio: 3.1 RATIO
VLDL: 24 mg/dL (ref 0–40)

## 2015-06-10 LAB — CBC
HEMATOCRIT: 42.4 % (ref 39.0–52.0)
HEMOGLOBIN: 13.5 g/dL (ref 13.0–17.0)
MCH: 28.1 pg (ref 26.0–34.0)
MCHC: 31.8 g/dL (ref 30.0–36.0)
MCV: 88.3 fL (ref 78.0–100.0)
Platelets: 240 10*3/uL (ref 150–400)
RBC: 4.8 MIL/uL (ref 4.22–5.81)
RDW: 12.8 % (ref 11.5–15.5)
WBC: 5 10*3/uL (ref 4.0–10.5)

## 2015-06-10 LAB — GLUCOSE, CAPILLARY
GLUCOSE-CAPILLARY: 109 mg/dL — AB (ref 65–99)
GLUCOSE-CAPILLARY: 108 mg/dL — AB (ref 65–99)
GLUCOSE-CAPILLARY: 174 mg/dL — AB (ref 65–99)

## 2015-06-10 LAB — PROTIME-INR
INR: 1.14 (ref 0.00–1.49)
PROTHROMBIN TIME: 14.8 s (ref 11.6–15.2)

## 2015-06-10 LAB — HEMOGLOBIN A1C
Hgb A1c MFr Bld: 6.2 % — ABNORMAL HIGH (ref 4.8–5.6)
Mean Plasma Glucose: 131 mg/dL

## 2015-06-10 LAB — TROPONIN I: Troponin I: <0.03 ng/mL (ref <0.031)

## 2015-06-10 LAB — HEPARIN LEVEL (UNFRACTIONATED)
HEPARIN UNFRACTIONATED: 0.55 [IU]/mL (ref 0.30–0.70)
HEPARIN UNFRACTIONATED: 0.57 [IU]/mL (ref 0.30–0.70)

## 2015-06-10 LAB — PLATELET INHIBITION P2Y12: Platelet Function  P2Y12: 50 [PRU] — ABNORMAL LOW (ref 194–418)

## 2015-06-10 SURGERY — LEFT HEART CATH AND CORONARY ANGIOGRAPHY
Anesthesia: LOCAL

## 2015-06-10 MED ORDER — VERAPAMIL HCL 2.5 MG/ML IV SOLN
INTRAVENOUS | Status: AC
  Filled 2015-06-10: qty 2

## 2015-06-10 MED ORDER — HEPARIN SODIUM (PORCINE) 1000 UNIT/ML IJ SOLN
INTRAMUSCULAR | Status: AC
  Filled 2015-06-10: qty 1

## 2015-06-10 MED ORDER — HEPARIN (PORCINE) IN NACL 2-0.9 UNIT/ML-% IJ SOLN
INTRAMUSCULAR | Status: AC
  Filled 2015-06-10: qty 1000

## 2015-06-10 MED ORDER — MIDAZOLAM HCL 2 MG/2ML IJ SOLN
INTRAMUSCULAR | Status: AC
  Filled 2015-06-10: qty 2

## 2015-06-10 MED ORDER — FENTANYL CITRATE (PF) 100 MCG/2ML IJ SOLN
INTRAMUSCULAR | Status: AC
Start: 2015-06-10 — End: 2015-06-10
  Filled 2015-06-10: qty 2

## 2015-06-10 MED ORDER — SODIUM CHLORIDE 0.9 % IV SOLN: INTRAVENOUS | Status: AC

## 2015-06-10 MED ORDER — NITROGLYCERIN 1 MG/10 ML FOR IR/CATH LAB
INTRA_ARTERIAL | Status: DC | PRN
  Administered 2015-06-10: 200 ug via INTRACORONARY

## 2015-06-10 MED ORDER — IOHEXOL 350 MG/ML SOLN
INTRAVENOUS | Status: DC | PRN
  Administered 2015-06-10: 60 mL via INTRA_ARTERIAL

## 2015-06-10 MED ORDER — VERAPAMIL HCL 2.5 MG/ML IV SOLN
INTRAVENOUS | Status: DC | PRN
  Administered 2015-06-10: 11:00:00 via INTRA_ARTERIAL

## 2015-06-10 MED ORDER — SODIUM CHLORIDE 0.9 % IJ SOLN: 3.0000 mL | INTRAMUSCULAR | Status: DC | PRN

## 2015-06-10 MED ORDER — NITROGLYCERIN 1 MG/10 ML FOR IR/CATH LAB
INTRA_ARTERIAL | Status: AC
  Filled 2015-06-10: qty 10

## 2015-06-10 MED ORDER — SODIUM CHLORIDE 0.9 % IJ SOLN: 3.0000 mL | Freq: Two times a day (BID) | INTRAMUSCULAR | Status: DC

## 2015-06-10 MED ORDER — LIDOCAINE HCL (PF) 1 % IJ SOLN
INTRAMUSCULAR | Status: DC | PRN
  Administered 2015-06-10: 11:00:00

## 2015-06-10 MED ORDER — HEPARIN SODIUM (PORCINE) 1000 UNIT/ML IJ SOLN
INTRAMUSCULAR | Status: DC | PRN
  Administered 2015-06-10: 4000 [IU] via INTRAVENOUS

## 2015-06-10 MED ORDER — MIDAZOLAM HCL 2 MG/2ML IJ SOLN
INTRAMUSCULAR | Status: DC | PRN
  Administered 2015-06-10: 2 mg via INTRAVENOUS

## 2015-06-10 MED ORDER — METFORMIN HCL ER (MOD) 1000 MG PO TB24: 1000.0000 mg | ORAL_TABLET | Freq: Every day | ORAL | Status: DC

## 2015-06-10 MED ORDER — LIDOCAINE HCL (PF) 1 % IJ SOLN
INTRAMUSCULAR | Status: AC
  Filled 2015-06-10: qty 30

## 2015-06-10 MED ORDER — ONDANSETRON HCL 4 MG/2ML IJ SOLN: 4.0000 mg | Freq: Four times a day (QID) | INTRAMUSCULAR | Status: DC | PRN

## 2015-06-10 MED ORDER — SODIUM CHLORIDE 0.9 % IV SOLN: 250.0000 mL | INTRAVENOUS | Status: DC | PRN

## 2015-06-10 MED ORDER — FENTANYL CITRATE (PF) 100 MCG/2ML IJ SOLN
INTRAMUSCULAR | Status: DC | PRN
  Administered 2015-06-10: 50 ug via INTRAVENOUS

## 2015-06-10 MED ORDER — ACETAMINOPHEN 325 MG PO TABS: 650.0000 mg | ORAL_TABLET | ORAL | Status: DC | PRN

## 2015-06-10 SURGICAL SUPPLY — 10 items
CATH INFINITI 5 FR JL3.5 (CATHETERS) ×2 IMPLANT
CATH INFINITI JR4 5F (CATHETERS) ×2 IMPLANT
DEVICE RAD COMP TR BAND LRG (VASCULAR PRODUCTS) ×2 IMPLANT
GLIDESHEATH SLEND SS 6F .021 (SHEATH) ×2 IMPLANT
KIT HEART LEFT (KITS) ×2 IMPLANT
PACK CARDIAC CATHETERIZATION (CUSTOM PROCEDURE TRAY) ×2 IMPLANT
TRANSDUCER W/STOPCOCK (MISCELLANEOUS) ×2 IMPLANT
TUBING CIL FLEX 10 FLL-RA (TUBING) ×2 IMPLANT
WIRE HI TORQ VERSACORE-J 145CM (WIRE) ×1 IMPLANT
WIRE SAFE-T 1.5MM-J .035X260CM (WIRE) ×2 IMPLANT

## 2015-06-10 NOTE — Progress Notes (Signed)
[place] for Heparin Indication: ACS/STEMI  Allergies  Allergen Reactions  . Bee Venom Anaphylaxis and Hives  . Shellfish Allergy Anaphylaxis and Hives  . Statins Other (See Comments)    Myalgias. Tolerating livalo.     Patient Measurements: Height: 5\' 8"  (172.7 cm) Weight: 185 lb (83.915 kg) IBW/kg (Calculated) : 68.4 Heparin Dosing Weight: 83.9  Vital Signs: Temp: 98 F (36.7 C) (12/11 2101) Temp Source: Oral (12/11 2101) BP: 121/84 mmHg (12/11 2101) Pulse Rate: 70 (12/11 2101)  Labs:  Recent Labs  06/09/15 0830 06/09/15 1244 06/09/15 1619 06/09/15 1809 06/09/15 2346  HGB 13.9  --   --   --   --   HCT 42.2  --   --   --   --   PLT 214  --   --   --   --   HEPARINUNFRC  --   --  0.13*  --  0.55  CREATININE 0.90  --   --   --   --   TROPONINI  --  <0.03  --  <0.03 <0.03    Estimated Creatinine Clearance: 100.2 mL/min (by C-G formula based on Cr of 0.9).  Assessment: 54 y.o. male with chest pain for heparin  Goal of Therapy:  Heparin level 0.3-0.7 units/ml Monitor platelets by anticoagulation protocol: Yes   Plan:  Continue Heparin at current rate  Follow-up am labs.  Phillis Knack, PharmD, BCPS

## 2015-06-10 NOTE — Progress Notes (Signed)
Patient Name: [name] Date of Encounter: 06/10/2015     Principal Problem:   Chest pain Active Problems:   DM2 (diabetes mellitus, type 2) ([place])   Coronary atherosclerosis, with multiple PCIs LCX,OM!   Hypertension   Hyperlipidemia   Allergy to contrast media (used for diagnostic x-rays)    SUBJECTIVE  No further chest pain. Awaiting cardiac cath.   CURRENT MEDS . aspirin  324 mg Oral NOW   Or  . aspirin  300 mg Rectal NOW  . aspirin EC  81 mg Oral Daily  . carvedilol  12.5 mg Oral BID WC  . clopidogrel  75 mg Oral Daily  . diphenhydrAMINE  25 mg Intravenous Pre-Cath  . famotidine (PEPCID) IV  20 mg Intravenous Pre-Cath  . gabapentin  600 mg Oral QHS  . insulin aspart  0-5 Units Subcutaneous QHS  . insulin aspart  0-9 Units Subcutaneous TID WC  . lisinopril  5 mg Oral Daily  . methylPREDNISolone (SOLU-MEDROL) injection  125 mg Intravenous Pre-Cath  . mirtazapine  30 mg Oral QHS  . nitroGLYCERIN  1 inch Topical 4 times per day  . omega-3 acid ethyl esters  1 g Oral Daily  . pravastatin  20 mg Oral q1800  . sodium chloride  3 mL Intravenous Q12H  . traMADol  50 mg Oral BID    OBJECTIVE  Filed Vitals:   06/09/15 1601 06/09/15 1635 06/09/15 2101 06/10/15 0439  BP: 130/80 118/82 121/84 93/53  Pulse: 61 69 70 53  Temp:   98 F (36.7 C) 97.2 F (36.2 C)  TempSrc:   Oral Oral  Resp:  14    Height:      Weight:    183 lb 6.4 oz (83.19 kg)  SpO2:  99% 100% 100%    Intake/Output Summary (Last 24 hours) at 06/10/15 0702 Last data filed at 06/10/15 0439  Gross per 24 hour  Intake      0 ml  Output   1400 ml  Net  -1400 ml   Filed Weights   06/09/15 0756 06/10/15 0439  Weight: 185 lb (83.915 kg) 183 lb 6.4 oz (83.19 kg)    PHYSICAL EXAM  General: Pleasant, NAD. Neuro: Alert and oriented X 3. Moves all extremities spontaneously. Psych: Normal affect. HEENT:  Normal  Neck: Supple without bruits or JVD. Lungs:  Resp regular and unlabored,  CTA. Heart: RRR no s3, s4, or murmurs. Abdomen: Soft, non-tender, non-distended, BS + x 4.  Extremities: No clubbing, cyanosis or edema. DP/PT/Radials 2+ and equal bilaterally.  Accessory Clinical Findings  CBC  Recent Labs  06/09/15 0830  WBC 4.3  HGB 13.9  HCT 42.2  MCV 87.2  PLT [id number]   Basic Metabolic Panel  Recent Labs  06/09/15 0830 06/09/15 1244 06/10/15 0505  NA 138  --  140  K 4.2  --  4.1  CL 106  --  108  CO2 23  --  24  GLUCOSE 122*  --  125*  BUN 18  --  13  CREATININE 0.90  --  0.86  CALCIUM 9.2  --  9.3  MG  --  2.0  --    Liver Function Tests  Recent Labs  06/09/15 1244  AST 24  ALT 32  ALKPHOS 52  BILITOT 1.1  PROT 6.5  ALBUMIN 4.4    Cardiac Enzymes  Recent Labs  06/09/15 1244 06/09/15 1809 06/09/15 2346  TROPONINI <0.03 <0.03 <0.03    Fasting Lipid  Panel  Recent Labs  06/10/15 0505  CHOL 132  HDL 42  LDLCALC 66  TRIG 122  CHOLHDL 3.1   Thyroid Function Tests  Recent Labs  06/09/15 1244  TSH 1.228    TELE  Some sinus brady and rare PVCs  Radiology/Studies  Dg Chest 2 View  06/09/2015  CLINICAL DATA:  Chest pain, coronary disease, prior coronary stents EXAM: CHEST  2 VIEW COMPARISON:  12/18/2014 FINDINGS: The heart size and mediastinal contours are within normal limits. Both lungs are clear. The visualized skeletal structures are unremarkable. Coronary stents noted. IMPRESSION: No active cardiopulmonary disease. Electronically Signed   By: Jerilynn Mages.  Shick M.D.   On: 06/09/2015 08:45    ASSESSMENT AND PLAN  54 year old male with history of HTN, HLD, DM, history of microvascular angina, CAD s/p multiple PCIs who presented to [facility]14 with recurrent CP.  CAD s/p multiple PCI's mainly in the circumflex/OM and RCA distribution. Most recent intervention was DES to the proximal circumflex and PTCA with cutting balloon to the OM1 in 08/2014 in the setting of progressive angina. P2y12 level drawn which shows adequate  platelet inhibition (50) -- Troponin neg x3. Continue heparin gtt -- Plan for LHC later today- being premedicated for contrast dye allergy. He reportedly had issues with radial access in the past, so groin prepped. -- Continue ASA/plavix, statin and BB  HLD- intolerant to most statins but tolerating pitavistatin. LDL 66 which is at goal  HTN- BPs soft this AM. Continue Coreg and lisinopril   DM- continue SSI   Signed, Eileen Stanford PA-C  Pager [phone]  I have examined the patient and reviewed assessment and plan and discussed with patient.  Agree with above as stated.  No PCI needed at cath today.  COntinue medical therapy.  He does not want to add any new meds at this time.  It appears that Imdur is an old med that he was not taking.  If angina worsens, could add back Imdur.  VARANASI,JAYADEEP S.

## 2015-06-10 NOTE — Progress Notes (Signed)
Nutrition Brief Note  Patient identified on the Malnutrition Screening Tool (MST) Report.   Wt Readings from Last 15 Encounters:  06/10/15 183 lb 6.4 oz (83.19 kg)  04/21/15 190 lb (86.183 kg)  10/24/14 185 lb 14.4 oz (84.324 kg)  10/02/14 185 lb (83.915 kg)  09/06/14 191 lb 12.8 oz (87 kg)  04/02/14 189 lb (85.73 kg)  02/15/14 185 lb 3.2 oz (84.006 kg)  12/19/13 180 lb (81.647 kg)  09/21/13 185 lb (83.915 kg)  08/28/13 186 lb 3.2 oz (84.46 kg)  05/11/13 189 lb (85.73 kg)  04/06/13 190 lb 12.8 oz (86.546 kg)  01/20/13 195 lb 3.2 oz (88.542 kg)  03/10/12 200 lb (90.719 kg)  12/16/11 203 lb 7.8 oz (92.3 kg)    Body mass index is 27.89 kg/(m^2). Patient meets criteria for overweight based on current BMI.   Patient with minimal weight loss of 4% over the past 2 months, which is not significant for the time frame. PO intake was good PTA. Diet just advanced to CHO modified after procedure (left heart cath) this AM, expect intake will be adequate. Labs and medications reviewed.   No nutrition interventions warranted at this time. If nutrition issues arise, please consult RD.   Molli Barrows, RD, LDN, [place] Pager [phone] After Hours Pager [phone]

## 2015-06-10 NOTE — Interval H&P Note (Signed)
History and Physical Interval Note:  06/10/2015 10:20 AM  [name]  has presented today for surgery, with the diagnosis of unstable angina  The various methods of treatment have been discussed with the patient and family. After consideration of risks, benefits and other options for treatment, the patient has consented to  Procedure(s): Left Heart Cath and Coronary Angiography (N/A) and possible coronary angioplasty as a surgical intervention .  The patient's history has been reviewed, patient examined, no change in status, stable for surgery.  I have reviewed the patient's chart and labs.  Questions were answered to the patient's satisfaction.     Rebekkah Powless, Quillian Quince

## 2015-06-10 NOTE — Discharge Summary (Signed)
Discharge Summary   Patient ID: [name] MRN: [id number], DOB/AGE: 53-Mar-1962 53 y.o. Admit date: 06/09/2015 D/C date:     06/10/2015  Primary Cardiologist: Dr. Debara Pickett   Principal Problem:   Unstable angina [facility]) Active Problems:   DM2 (diabetes mellitus, type 2) ([place])   Coronary atherosclerosis, with multiple PCIs LCX,OM!   Hypertension   Hyperlipidemia   Allergy to contrast media (used for diagnostic x-rays)   ED (erectile dysfunction)    Admission Dates: 06/09/15-06/10/15 Discharge Diagnosis: chest pain s/p LHC with no flow limiting lesions. Treated medically   HPI: [name] is a 54 y.o. male with a history of history of HTN, HLD, DM, history of microvascular angina, CAD s/p multiple PCIs who presented to [facility] 09/04/14 with recurrent CP.  According to record and patient, he had a cardiac catheterization in 2003, 2012, 2013 and 2016. Cardiac catheterization on 12/14/2011 showed minor irregularities in LAD, normal left main, 70% in-stent restenosis in mid left circumflex, intracoronary nitroglycerin injected in RCA for what appeared to be spasm, EF 60%. The in-stent restenosis in the circumflex artery was treated with 3.015 mm DES, he also received a 3 mm x 12 mm long DES to proximal left circumflex. Last stress test in 2013 showed EF 52%, mild perfusion defect in mid inferolateral and basal inferolateral region consistent with scar, overall considered low risk study. He also had a metabolic test in the office in February 2014 which showed good exercise effort, peak VO2 of 79% predicted with normal VO2 heart rate curves indicating only mild deconditioning but no evidence of ischemia  Last cath was 08/2014 with PCI: 1. 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 Normal LV systolic function 3. Unstable angina 4. Successful PTCA/DES x 1 proximal Circumflex 5. Successful PTCA/cutting balloon angioplasty  OM1 Left Ventricular Angiogram: LVEF=50-55%.   He presented to [facility] on 06/09/15 with chest pain concerning for [place] and was admitted for heart cath.   Hospital Course  CAD s/p multiple PCI's mainly in the circumflex/OM and RCA distribution. Most recent intervention was DES to the proximal circumflex and PTCA with cutting balloon to the OM1 in 08/2014 in the setting of progressive angina.  -- P2y12 level drawn 06/10/15 which shows adequate platelet inhibition (50) -- Troponin neg x3.  -- LHC 06/10/15 with 2 vessel CAD with patent stent in mRCA. Stents in proximal LCX and OM1 with mild to moderate ISE ~40-50%. LVEDP 18. ISR in the LCX and OM-1 not flow-limiting. Felt best to treat medically with aggressive risk modification -- Continue ASA/plavix, statin and BB  HLD- intolerant to most statins but tolerating pitavistatin. LDL 66 which is at goal  HTN- BPs soft this AM. Continue Coreg and lisinopril   DM- continue home regmien   The patient has had an uncomplicated hospital course and is recovering well. The radial catheter site is stable. He has been seen by Dr. Haroldine Laws today and deemed ready for discharge home. All follow-up appointments have been scheduled. Discharge medications are listed below.   Discharge Vitals: Blood pressure 121/78, pulse 57, temperature 98.3 F (36.8 C), temperature source Oral, resp. rate 14, height 5\' 8"  (1.727 m), weight 183 lb 6.4 oz (83.19 kg), SpO2 99 %.  Labs: Lab Results  Component Value Date   WBC 4.3 06/09/2015   HGB 13.9 06/09/2015   HCT 42.2 06/09/2015   MCV 87.2 06/09/2015   PLT 214 06/09/2015  Recent Labs Lab 06/09/15 1244 06/10/15 0505  NA  --  140  K  --  4.1  CL  --  108  CO2  --  24  BUN  --  13  CREATININE  --  0.86  CALCIUM  --  9.3  PROT 6.5  --   BILITOT 1.1  --   ALKPHOS 52  --   ALT 32  --   AST 24  --   GLUCOSE  --  125*    Recent Labs  06/09/15 1244 06/09/15 1809 06/09/15 2346  TROPONINI <0.03 <0.03 <0.03    Lab Results  Component Value Date   CHOL 132 06/10/2015   HDL 42 06/10/2015   LDLCALC 66 06/10/2015   TRIG 122 06/10/2015   Lab Results  Component Value Date   DDIMER <0.22 02/21/2011    Diagnostic Studies/Procedures   Dg Chest 2 View  06/09/2015  CLINICAL DATA:  Chest pain, coronary disease, prior coronary stents EXAM: CHEST  2 VIEW COMPARISON:  12/18/2014 FINDINGS: The heart size and mediastinal contours are within normal limits. Both lungs are clear. The visualized skeletal structures are unremarkable. Coronary stents noted. IMPRESSION: No active cardiopulmonary disease. Electronically Signed   By: Jerilynn Mages.  Shick M.D.   On: 06/09/2015 08:45    LHC 06/10/15   Conclusion     Prox Cx to Mid Cx lesion, 40% stenosed. The lesion was previously treated with a stent (unknown type).  1st Mrg lesion, 45% stenosed.  Dist LAD lesion, 35% stenosed.  Assessment:  1) 2 vessel CAD with patent stent in mRCA. Stents in proximal LCX and OM1 with mild to moderate ISE ~40-50%. 2) LVEDP 18  Plan/Discussion: There is some ISR in the LCX and OM-1 but not flow-limiting. Will treat medically for now with aggressive risk modification. Films reviewed with Dr. Gwenlyn Found of the interventional team. Can go home later today if post-cath course uncomplicated.      Discharge Medications     Medication List    STOP taking these medications        isosorbide mononitrate 60 MG 24 hr tablet  Commonly known as:  IMDUR      TAKE these medications        aspirin 81 MG EC tablet  Take 1 tablet (81 mg total) by mouth daily.     carvedilol 12.5 MG tablet  Commonly known as:  COREG  Take 1 tablet (12.5 mg total) by mouth 2 (two) times daily with a meal.     clopidogrel 75 MG tablet  Commonly known as:  PLAVIX  Take 1 tablet (75 mg total) by mouth daily.     FISH OIL PO  Take 1,200 mg by mouth daily.     gabapentin 300 MG capsule  Commonly known as:  NEURONTIN  Take 2 capsules (600 mg total)  by mouth at bedtime.     lisinopril 5 MG tablet  Commonly known as:  PRINIVIL,ZESTRIL  Take 5 mg by mouth daily.     LIVALO 4 MG Tabs  Generic drug:  Pitavastatin Calcium  TAKE ONE TABLET BY MOUTH ONCE DAILY     metFORMIN 1000 MG (MOD) 24 hr tablet  Commonly known as:  GLUMETZA  Take 1 tablet (1,000 mg total) by mouth daily with breakfast.  Start taking on:  06/12/2015     mirtazapine 30 MG tablet  Commonly known as:  REMERON  Take 30 mg by mouth at bedtime.     nitroGLYCERIN 0.4 MG  SL tablet  Commonly known as:  NITROSTAT  Place 1 tablet (0.4 mg total) under the tongue every 5 (five) minutes as needed. For chest pain     traMADol 50 MG tablet  Commonly known as:  ULTRAM  Take 50 mg by mouth 2 (two) times daily. For pain        Disposition   The patient will be discharged in stable condition to home.  Follow-up Information    Follow up with Erlene Quan, PA-C On 07/11/2015.   Specialties:  Cardiology, Radiology   Why:  @ 9am    Contact information:   [place] [address] [phone]         Duration of Discharge Encounter: Greater than 30 minutes including physician and PA time.  Signed, Yola Paradiso S. PA-C 06/10/2015, 3:01 PM   I have examined the patient and reviewed assessment and plan and discussed with patient.  Agree with above as stated.  No PCI needed at cath today.  COntinue medical therapy.  He does not want to add any new meds at this time.  It appears that Imdur is an old med that he was not taking.  If angina worsens, could add back Imdur. Plan discharge after TR band removed.  Adalynn Corne S.

## 2015-06-10 NOTE — Progress Notes (Signed)
Utilization review completed.  

## 2015-06-10 NOTE — Discharge Instructions (Signed)

## 2015-06-11 ENCOUNTER — Telehealth: Payer: Self-pay | Admitting: Internal Medicine

## 2015-06-11 NOTE — Telephone Encounter (Signed)
D/C Phone call .Marland Kitchen Appt is on 07/11/15 at 9am w/ Kerin Ransom at [employer]

## 2015-06-11 NOTE — Telephone Encounter (Signed)
lmtcb

## 2015-06-13 NOTE — Telephone Encounter (Signed)
TOC call to patient.No answer.[place].

## 2015-06-18 NOTE — Telephone Encounter (Signed)
Patient contacted regarding discharge from [facility] on 06/10/15.  LM with info to call if he has any problems and reminded of follow-up appt.

## 2015-07-11 ENCOUNTER — Other Ambulatory Visit: Payer: Self-pay

## 2015-07-11 ENCOUNTER — Ambulatory Visit (INDEPENDENT_AMBULATORY_CARE_PROVIDER_SITE_OTHER): Payer: Medicaid Other | Admitting: Cardiology

## 2015-07-11 ENCOUNTER — Encounter: Payer: Self-pay | Admitting: Cardiology

## 2015-07-11 VITALS — BP 130/86 | HR 65 | Ht 68.0 in | Wt 187.0 lb

## 2015-07-11 DIAGNOSIS — N528 Other male erectile dysfunction: Secondary | ICD-10-CM

## 2015-07-11 DIAGNOSIS — E785 Hyperlipidemia, unspecified: Secondary | ICD-10-CM

## 2015-07-11 DIAGNOSIS — I10 Essential (primary) hypertension: Secondary | ICD-10-CM | POA: Diagnosis not present

## 2015-07-11 DIAGNOSIS — Z91041 Radiographic dye allergy status: Secondary | ICD-10-CM

## 2015-07-11 MED ORDER — FISH OIL 1200 MG PO CAPS: 1200.0000 mg | ORAL_CAPSULE | Freq: Every day | ORAL | Status: DC

## 2015-07-11 MED ORDER — SILDENAFIL CITRATE 50 MG PO TABS: 50.0000 mg | ORAL_TABLET | Freq: Every day | ORAL | Status: DC | PRN

## 2015-07-11 MED ORDER — SILDENAFIL CITRATE 20 MG PO TABS: ORAL_TABLET | ORAL | Status: DC

## 2015-07-11 MED ORDER — CYCLOBENZAPRINE HCL 10 MG PO TABS: 10.0000 mg | ORAL_TABLET | Freq: Three times a day (TID) | ORAL | Status: DC | PRN

## 2015-07-11 NOTE — Addendum Note (Signed)
Addended by: Golden Hurter D on: 07/11/2015 04:50 PM   Modules accepted: Orders, Medications

## 2015-07-11 NOTE — Assessment & Plan Note (Addendum)
LDL 66- tolerating Livalo

## 2015-07-11 NOTE — Patient Instructions (Signed)
Take Flexeril 10 mg three times a day as needed   Your physician wants you to follow-up in: 6 months with Dr.Hilty. You will receive a reminder letter in the mail two months in advance. If you don't receive a letter, please call our office to schedule the follow-up appointment.

## 2015-07-11 NOTE — Assessment & Plan Note (Signed)
CAD, prior PCI to RCA, OM1, and CFX. Several caths/PCI's. Last cath Dec 2016- 40-50% CFX OMI ISR- plan for medical Rx.

## 2015-07-11 NOTE — Assessment & Plan Note (Signed)
On oral agents 

## 2015-07-11 NOTE — Assessment & Plan Note (Signed)
Requested [facility]

## 2015-07-11 NOTE — Progress Notes (Signed)
07/11/2015 [name]   1960-12-01  [id number]  Primary Physician [name], FNP Primary Cardiologist: Dr Debara Pickett  HPI:  55 y.o. AA male with a history of history of HTN, HLD, DM, history of microvascular angina, and major CAD s/p multiple PCIs.. According to records he had a cardiac catheterization in 2003, 2012, 2013 and 2016. His last PCI was March 2016 when he had CFX DES and OM1 POBA. The previously placed RCA stent was patent. He was admitted in Dec 2016 with chest pain. Cath then revealed moderate CFX and OM1 IST (40-50%). Plan is for medical Rx. He is in the office today for follow up. He has not had chest pain. He is off Isordil. He says he rides his bike everywhere and is going to the gym. He says he is now a vegetarian.    Current Outpatient Prescriptions  Medication Sig Dispense Refill  . aspirin 81 MG EC tablet Take 1 tablet (81 mg total) by mouth daily. 30 tablet 11  . carvedilol (COREG) 12.5 MG tablet Take 1 tablet (12.5 mg total) by mouth 2 (two) times daily with a meal. 180 tablet 1  . clopidogrel (PLAVIX) 75 MG tablet Take 1 tablet (75 mg total) by mouth daily. 30 tablet 11  . gabapentin (NEURONTIN) 300 MG capsule Take 2 capsules (600 mg total) by mouth at bedtime. 180 capsule 3  . lisinopril (PRINIVIL,ZESTRIL) 5 MG tablet Take 5 mg by mouth daily.    Marland Kitchen LIVALO 4 MG TABS TAKE ONE TABLET BY MOUTH ONCE DAILY 90 tablet 1  . metFORMIN (GLUMETZA) 1000 MG (MOD) 24 hr tablet Take 1 tablet (1,000 mg total) by mouth daily with breakfast. 30 tablet 6  . mirtazapine (REMERON) 30 MG tablet Take 30 mg by mouth at bedtime.    . nitroGLYCERIN (NITROSTAT) 0.4 MG SL tablet Place 1 tablet (0.4 mg total) under the tongue every 5 (five) minutes as needed. For chest pain 25 tablet 3  . Omega-3 Fatty Acids (FISH OIL PO) Take 1,200 mg by mouth daily.     . traMADol (ULTRAM) 50 MG tablet Take 50 mg by mouth 2 (two) times daily. For pain     No current facility-administered medications  for this visit.    Allergies  Allergen Reactions  . Bee Venom Anaphylaxis and Hives  . Shellfish Allergy Anaphylaxis and Hives  . Statins Other (See Comments)    Myalgias. Tolerating livalo.     Social History   Social History  . Marital Status: Divorced    Spouse Name: N/A  . Number of Children: 2  . Years of Education: GED   Occupational History  . Not on file.   Social History Main Topics  . Smoking status: Former Smoker -- 1.00 packs/day for 10 years    Types: Cigarettes    Quit date: 04/07/2009  . Smokeless tobacco: Never Used  . Alcohol Use: No     Comment: 12/15/11 "til 2010, drank a pint of gin/day at least"  . Drug Use: No     Comment: 12/15/11 "last cocaine was 2010"  . Sexual Activity: Not Currently   Other Topics Concern  . Not on file   Social History Narrative     Review of Systems: General: negative for chills, fever, night sweats or weight changes.  Cardiovascular: negative for chest pain, dyspnea on exertion, edema, orthopnea, palpitations, paroxysmal nocturnal dyspnea or shortness of breath Dermatological: negative for rash Respiratory: negative for cough or wheezing Urologic: negative for  hematuria Abdominal: negative for nausea, vomiting, diarrhea, bright red blood per rectum, melena, or hematemesis Neurologic: negative for visual changes, syncope, or dizziness All other systems reviewed and are otherwise negative except as noted above.    Blood pressure 130/86, pulse 65, height 5\' 8"  (1.727 m), weight 187 lb (84.823 kg).  General appearance: alert, cooperative and no distress Neck: no carotid bruit and no JVD Lungs: clear to auscultation bilaterally Heart: regular rate and rhythm Extremities: Rt radila site withlkut hematoma Skin: Skin color, texture, turgor normal. No rashes or lesions Neurologic: Grossly normal  EKG NSR  ASSESSMENT AND PLAN:   CAD S/P percutaneous coronary angioplasty CAD, prior PCI to RCA, OM1, and CFX. Several  caths/PCI's. Last cath Dec 2016- 40-50% CFX OMI ISR- plan for medical Rx.  Hyperlipidemia LDL 66- tolerating Livalo  Hypertension Controlled  DM2 (diabetes mellitus, type 2) ([place]) On oral agents  Allergy to contrast media (used for diagnostic x-rays) .  ED (erectile dysfunction) Requested [facility] Rx   PLAN  He asked for an Rx for Cilais and I gave him that with instructions to not take if he is using nitrates. He also asked for an Rx for Flexeril which I provided. We'll see him back in 6 months.   Kerin Ransom K PA-C 07/11/2015 9:34 AM

## 2015-07-11 NOTE — Addendum Note (Signed)
Addended by: Golden Hurter D on: 07/11/2015 09:50 AM   Modules accepted: Orders

## 2015-07-11 NOTE — Assessment & Plan Note (Signed)
Controlled.  

## 2015-08-03 ENCOUNTER — Other Ambulatory Visit: Payer: Self-pay | Admitting: Physician Assistant

## 2015-09-03 ENCOUNTER — Other Ambulatory Visit: Payer: Self-pay | Admitting: Physician Assistant

## 2015-09-08 ENCOUNTER — Other Ambulatory Visit: Payer: Self-pay | Admitting: Physician Assistant

## 2015-09-09 NOTE — Telephone Encounter (Signed)
REFILL 

## 2016-01-13 ENCOUNTER — Ambulatory Visit (INDEPENDENT_AMBULATORY_CARE_PROVIDER_SITE_OTHER): Payer: Medicaid Other | Admitting: Internal Medicine

## 2016-01-13 ENCOUNTER — Encounter: Payer: Self-pay | Admitting: Internal Medicine

## 2016-01-13 VITALS — BP 104/70 | HR 67 | Ht 66.0 in | Wt 175.0 lb

## 2016-01-13 DIAGNOSIS — E785 Hyperlipidemia, unspecified: Secondary | ICD-10-CM

## 2016-01-13 DIAGNOSIS — I2510 Atherosclerotic heart disease of native coronary artery without angina pectoris: Secondary | ICD-10-CM | POA: Diagnosis not present

## 2016-01-13 DIAGNOSIS — I10 Essential (primary) hypertension: Secondary | ICD-10-CM

## 2016-01-13 NOTE — Progress Notes (Signed)
OFFICE NOTE  Chief Complaint:  No complaints  Primary Care Physician: Javier Docker, MD  HPI:  [name] a 55 year old gentleman who has a history of coronary disease, hypertension, dyslipidemia, diabetes type 2, and microvascular angina. He has had multiple PCIs, in-stent restenosis, but has been actually fairly stable since his last drug-eluting stent placement in June 2013. He had a nuclear stress test which showed some mild abnormalities and was considered low risk and has been treated medically.  He had chest pain in January 2014 and was seen at [facility]; however, no intervention was performed at that time. Recently he had some dyspepsia treated by a PPI which has resolved. He has had a little bit of weight gain and decreased exercise, which he is working on, and some dietary indiscretion. He also had a metabolic test in our office just on August 08, 2012, which showed good exercise effort, peak VO2 of 79% predicted with normal VO2 heart-rate curves indicating only mild deconditioning but no evidence of ischemia.   Since his last office visit he is adopted a vegetarian lifestyle and has managed to lose almost 20 pounds. He continues to exercise and his made great strides as far as his symptoms are concerned. He still has occasional episodes of angina which are nitrate responsive. He is concerned however about taking higher doses of Imdur as he read that it may not be effective in the long term.  He is inquiring about possibly coming off the medication if possible. He is no longer taking little low as he has made dietary changes and wishes to use a natural approach rather than statin medication.  I've reminded him that because he has significant coronary disease and multiple stents that statins are still indicated despite dietary changes.  [name] is doing well today. Denies any chest pain. He is interested in seeing what his cholesterol is coming down to now that he's been on a vegan  and diet. He is not on any cholesterol medications. He also reports some sexual dysfunction and is interested in Viagra.  01/13/2016  [name] returns today for follow-up. He is doing exceedingly well. He continues to exercise. He had switched to a vegan diet but recently had to introduce some dairy. His weight is down another 10 pounds and again he has no anginal symptoms at all. He is interested actually in coming off of Plavix today. It's been more than a year since his stent and since he's been free of angina I think that's very reasonable. Recent cholesterol testing shows marked improvement in his numbers including an LDL of 66 and total cholesterol of 132.  PMHx:  Past Medical History  Diagnosis Date  . Coronary artery disease     a. Multiple prior caths/PCI. Cath 2013 with possible spasm of RCA, 70% ISR of mid LCx with subsequent DES to mLCx and prox LCX. b. H/o microvascular angina. c. Recurrent angina 08/2014 - s/p PTCA/DES to prox Cx, PTCA/CBA to OM1.  c. LHC 06/10/15 with patent stents and some ISR in LCX and OM-1 that was not flow limiting --> Rx   . Hypertension   . Dyslipidemia     a. Intolerant to many statins except tolerating Livalo.  . S/P angioplasty with stent, DES, to proximal and mid LCX 12/15/11 12/15/2011  . Myocardial infarction ([place]) ~ 2010  . Type II diabetes mellitus ([place])   . GERD (gastroesophageal reflux disease)   . Arthritis   . Chronic lower back  pain     Past Surgical History  Procedure Laterality Date  . Excisional hemorrhoidectomy  1984  . Lipoma excision      back of the head  . Cardiometabolic testing  123456    good exercise effort, peak VO2 79% predicted with normal VO2 HR curves (mild deconditioning)  . Nm myocar perf wall motion  02/2012    lexiscan myoview; mild perfusion defect in mid inferolateral & basal inferolateral region (infarct/scar); EF 52%, abnormal but ow risk scan  . Cardiac catheterization  06/15/2002    LAD with prox 40%  stenosis, norma L main, Cfx with 25% lesion, RCA with long mid 25% stenosis (Dr. Vita Barley)  . Cardiac catheterization  04/01/2010    normal L main, LAD wit mild stenosis, L Cfx with 70% in-stent restenosis, RCA with 70% in-stent restenosis, LVEF >60% (Dr. K. Mali Leomia Blake) - cutting ballon arthrectomy to RCA & Cfx (Dr. Rockne Menghini)  . Cardiac catheterization  08/25/2010    preserved global LV contractility; multivessel CAD, diffuse 90-95% in-stent restenosis in prox placed Cfx stent - cutting balloon arthrectomy in Cfx with multiple dilatations 90-95% to 0% stenosis (Dr. Corky Downs)  . Cardiac catheterization  01/26/2011    PCI & stenting of aggresive in-stent restenosis within previously stented AV groove Cfx with 3.0x39mm Taxus DES (previous stents were Promus) (Dr. Adora Fridge)  . Cardiac catheterization  05/11/2011    preserved LV function, 40% mid LAD stenosis, 30-40% narrowing proximal to stented semgnet of prox Cfx, patent mid RCA stent with smooth 20% narrowing in distal RCA (Dr. Corky Downs)  . Cardiac catheterization  12/15/2011    PCI & stenting of proximal & mid Cfx with DES - 3.0x81mm in proximal, 3.0x24mm in mid (Dr. Adora Fridge)  . Left heart catheterization with coronary angiogram N/A 05/11/2011    Procedure: LEFT HEART CATHETERIZATION WITH CORONARY ANGIOGRAM;  Surgeon: Troy Sine, MD;  Location[facility] CATH LAB;  Service: Cardiovascular;  Laterality: N/A;  Possible percutaneous coronary intervention, possible IVUS  . Left heart catheterization with coronary angiogram N/A 12/15/2011    Procedure: LEFT HEART CATHETERIZATION WITH CORONARY ANGIOGRAM;  Surgeon: Lorretta Harp, MD;  Location: [facility] CATH LAB;  Service: Cardiovascular;  Laterality: N/A;  . Left heart catheterization with coronary angiogram N/A 09/05/2014    Procedure: LEFT HEART CATHETERIZATION WITH CORONARY ANGIOGRAM;  Surgeon: Burnell Blanks, MD;  Location: [facility] CATH LAB;  Service: Cardiovascular;  Laterality: N/A;  . Percutaneous  coronary stent intervention (pci-s)  09/05/2014    Procedure: PERCUTANEOUS CORONARY STENT INTERVENTION (PCI-S);  Surgeon: Burnell Blanks, MD;  Location: [facility] CATH LAB;  Service: Cardiovascular;;  . Cardiac catheterization N/A 06/10/2015    Procedure: Left Heart Cath and Coronary Angiography;  Surgeon: Jolaine Artist, MD;  Location: [place] CV LAB;  Service: Cardiovascular;  Laterality: N/A;    FAMHx:  Family History  Problem Relation Age of Onset  . Coronary artery disease Paternal Grandmother   . Leukemia Mother   . Prostate cancer Father   . Cancer Paternal Grandfather   . Cancer Brother     SOCHx:   reports that he quit smoking about 6 years ago. His smoking use included Cigarettes. He has a 10 pack-year smoking history. He has never used smokeless tobacco. He reports that he does not drink alcohol or use illicit drugs.  ALLERGIES:  Allergies  Allergen Reactions  . Bee Venom Anaphylaxis and Hives  . Shellfish Allergy Anaphylaxis and Hives  . Statins Other (  See Comments)    Myalgias. Tolerating livalo.     ROS: Pertinent items noted in HPI and remainder of comprehensive ROS otherwise negative.  HOME MEDS: Current Outpatient Prescriptions  Medication Sig Dispense Refill  . ASPIR-LOW 81 MG EC tablet TAKE ONE TABLET BY MOUTH ONCE DAILY 30 tablet 5  . carvedilol (COREG) 12.5 MG tablet Take 1 tablet (12.5 mg total) by mouth 2 (two) times daily with a meal. 180 tablet 1  . cyclobenzaprine (FLEXERIL) 10 MG tablet Take 1 tablet (10 mg total) by mouth 3 (three) times daily as needed for muscle spasms. 30 tablet 0  . gabapentin (NEURONTIN) 300 MG capsule Take 2 capsules (600 mg total) by mouth at bedtime. 180 capsule 3  . lisinopril (PRINIVIL,ZESTRIL) 5 MG tablet Take 5 mg by mouth daily.    Marland Kitchen LIVALO 4 MG TABS TAKE ONE TABLET BY MOUTH ONCE DAILY 90 tablet 1  . metFORMIN (GLUMETZA) 1000 MG (MOD) 24 hr tablet Take 1 tablet (1,000 mg total) by mouth daily with breakfast. 30  tablet 6  . mirtazapine (REMERON) 30 MG tablet Take 30 mg by mouth at bedtime.    . nitroGLYCERIN (NITROSTAT) 0.4 MG SL tablet Place 1 tablet (0.4 mg total) under the tongue every 5 (five) minutes as needed. For chest pain 25 tablet 3  . Omega-3 Fatty Acids (FISH OIL) 1200 MG CAPS Take 1 capsule (1,200 mg total) by mouth daily. 90 capsule 3  . sildenafil (REVATIO) 20 MG tablet Take 2 to 3 tablets as needed for sexual activity 100 tablet 0  . traMADol (ULTRAM) 50 MG tablet Take 50 mg by mouth 2 (two) times daily. For pain     No current facility-administered medications for this visit.    LABS/IMAGING: No results found for this or any previous visit (from the past 48 hour(s)). No results found.  VITALS: BP 104/70 mmHg  Pulse 67  Ht 5\' 6"  (1.676 m)  Wt 175 lb (79.379 kg)  BMI 28.26 kg/m2  EXAM: General appearance: alert and no distress Neck: no carotid bruit and no JVD Lungs: clear to auscultation bilaterally Heart: regular rate and rhythm, S1, S2 normal, no murmur, click, rub or gallop Abdomen: soft, non-tender; bowel sounds normal; no masses,  no organomegaly Extremities: extremities normal, atraumatic, no cyanosis or edema Pulses: 2+ and symmetric Skin: Skin color, texture, turgor normal. No rashes or lesions Neurologic: Grossly normal Psych: Mood, affect normal  EKG: Sinus rhythm at 67  ASSESSMENT: 1. Coronary artery disease status post multiple PCI as with the last stent to the circumflex in 2013 2. Dyslipidemia-on Livalo 3. Hypertension 4. Diabetes type 2 5. Erectile dysfunction  PLAN: 1.   Mr. Plona has not had any anginal symptoms for over a year. He is interested in getting off of clopidogrel which is reasonable at this point. I've advised him to discontinue that but continue low-dose aspirin. Blood pressure is well controlled. Weight is improving. He exercises regularly and eats a very healthy diet. He should continue on the Livalo since it appears to be driving his  cholesterol very low. This should reduce his risk of future cardiac events. Follow-up with me annually per his request or sooner as necessary.  Pixie Casino, MD, [facility] Attending Cardiologist [place] [facility] Jachin Coury 01/13/2016, 5:46 PM

## 2016-01-13 NOTE — Patient Instructions (Signed)
Your physician has recommended you make the following change in your medication: STOP plavix  Your physician wants you to follow-up in: 12 months with Dr. Debara Pickett. You will receive a reminder letter in the mail two months in advance. If you don't receive a letter, please call our office to schedule the follow-up appointment.

## 2016-03-21 ENCOUNTER — Other Ambulatory Visit: Payer: Self-pay | Admitting: Specialist

## 2016-03-21 DIAGNOSIS — R7989 Other specified abnormal findings of blood chemistry: Secondary | ICD-10-CM

## 2016-03-21 DIAGNOSIS — H539 Unspecified visual disturbance: Secondary | ICD-10-CM

## 2016-04-04 ENCOUNTER — Inpatient Hospital Stay: Admission: RE | Admit: 2016-04-04 | Payer: Medicaid Other | Source: Ambulatory Visit

## 2016-06-03 ENCOUNTER — Emergency Department (HOSPITAL_COMMUNITY): Payer: Medicaid Other

## 2016-06-03 ENCOUNTER — Encounter (HOSPITAL_COMMUNITY): Payer: Self-pay

## 2016-06-03 ENCOUNTER — Emergency Department (HOSPITAL_COMMUNITY)
Admission: EM | Admit: 2016-06-03 | Discharge: 2016-06-03 | Disposition: A | Discharge status: Home / Self Care | Payer: Medicaid Other | Attending: Emergency Medicine | Admitting: Emergency Medicine

## 2016-06-03 DIAGNOSIS — Z7982 Long term (current) use of aspirin: Secondary | ICD-10-CM | POA: Diagnosis not present

## 2016-06-03 DIAGNOSIS — I2510 Atherosclerotic heart disease of native coronary artery without angina pectoris: Secondary | ICD-10-CM | POA: Insufficient documentation

## 2016-06-03 DIAGNOSIS — Z7984 Long term (current) use of oral hypoglycemic drugs: Secondary | ICD-10-CM | POA: Insufficient documentation

## 2016-06-03 DIAGNOSIS — R791 Abnormal coagulation profile: Secondary | ICD-10-CM | POA: Insufficient documentation

## 2016-06-03 DIAGNOSIS — E119 Type 2 diabetes mellitus without complications: Secondary | ICD-10-CM | POA: Diagnosis not present

## 2016-06-03 DIAGNOSIS — R42 Dizziness and giddiness: Secondary | ICD-10-CM | POA: Diagnosis not present

## 2016-06-03 DIAGNOSIS — I252 Old myocardial infarction: Secondary | ICD-10-CM | POA: Insufficient documentation

## 2016-06-03 DIAGNOSIS — Z87891 Personal history of nicotine dependence: Secondary | ICD-10-CM | POA: Insufficient documentation

## 2016-06-03 DIAGNOSIS — R4781 Slurred speech: Secondary | ICD-10-CM | POA: Insufficient documentation

## 2016-06-03 DIAGNOSIS — I10 Essential (primary) hypertension: Secondary | ICD-10-CM | POA: Diagnosis not present

## 2016-06-03 DIAGNOSIS — R479 Unspecified speech disturbances: Secondary | ICD-10-CM

## 2016-06-03 LAB — CBC
HCT: 45.4 % (ref 39.0–52.0)
HEMOGLOBIN: 15.4 g/dL (ref 13.0–17.0)
MCH: 28.5 pg (ref 26.0–34.0)
MCHC: 33.9 g/dL (ref 30.0–36.0)
MCV: 84.1 fL (ref 78.0–100.0)
Platelets: 243 10*3/uL (ref 150–400)
RBC: 5.4 MIL/uL (ref 4.22–5.81)
RDW: 12.6 % (ref 11.5–15.5)
WBC: 5.2 10*3/uL (ref 4.0–10.5)

## 2016-06-03 LAB — PROTIME-INR
INR: 1.01
Prothrombin Time: 13.3 seconds (ref 11.4–15.2)

## 2016-06-03 LAB — I-STAT TROPONIN, ED: Troponin i, poc: 0 ng/mL (ref 0.00–0.08)

## 2016-06-03 LAB — DIFFERENTIAL
BASOS ABS: 0 10*3/uL (ref 0.0–0.1)
Basophils Relative: 1 %
Eosinophils Absolute: 0.2 10*3/uL (ref 0.0–0.7)
Eosinophils Relative: 5 %
LYMPHS ABS: 2.2 10*3/uL (ref 0.7–4.0)
LYMPHS PCT: 43 %
Monocytes Absolute: 0.4 10*3/uL (ref 0.1–1.0)
Monocytes Relative: 8 %
NEUTROS ABS: 2.3 10*3/uL (ref 1.7–7.7)
NEUTROS PCT: 43 %

## 2016-06-03 LAB — CBG MONITORING, ED: Glucose-Capillary: 174 mg/dL — ABNORMAL HIGH (ref 65–99)

## 2016-06-03 LAB — COMPREHENSIVE METABOLIC PANEL
ALBUMIN: 4.2 g/dL (ref 3.5–5.0)
ALK PHOS: 51 U/L (ref 38–126)
ALT: 34 U/L (ref 17–63)
AST: 33 U/L (ref 15–41)
Anion gap: 10 (ref 5–15)
BILIRUBIN TOTAL: 0.7 mg/dL (ref 0.3–1.2)
BUN: 6 mg/dL (ref 6–20)
CALCIUM: 9.2 mg/dL (ref 8.9–10.3)
CO2: 22 mmol/L (ref 22–32)
Chloride: 104 mmol/L (ref 101–111)
Creatinine, Ser: 0.83 mg/dL (ref 0.61–1.24)
GFR calc Af Amer: >60 mL/min (ref >60)
GFR calc non Af Amer: >60 mL/min (ref >60)
GLUCOSE: 189 mg/dL — AB (ref 65–99)
Potassium: 4.9 mmol/L (ref 3.5–5.1)
Sodium: 136 mmol/L (ref 135–145)
TOTAL PROTEIN: 6.5 g/dL (ref 6.5–8.1)

## 2016-06-03 LAB — I-STAT CHEM 8, ED
BUN: 7 mg/dL (ref 6–20)
CREATININE: 0.8 mg/dL (ref 0.61–1.24)
Calcium, Ion: 1.14 mmol/L — ABNORMAL LOW (ref 1.15–1.40)
Chloride: 103 mmol/L (ref 101–111)
Glucose, Bld: 182 mg/dL — ABNORMAL HIGH (ref 65–99)
HEMATOCRIT: 47 % (ref 39.0–52.0)
Hemoglobin: 16 g/dL (ref 13.0–17.0)
Potassium: 4.8 mmol/L (ref 3.5–5.1)
Sodium: 137 mmol/L (ref 135–145)
TCO2: 23 mmol/L (ref 0–100)

## 2016-06-03 LAB — APTT: aPTT: 28 seconds (ref 24–36)

## 2016-06-03 LAB — ETHANOL: Alcohol, Ethyl (B): <5 mg/dL (ref <5)

## 2016-06-03 MED ORDER — ASPIRIN EC 325 MG PO TBEC: 325.0000 mg | DELAYED_RELEASE_TABLET | Freq: Every day | ORAL | 0 refills | Status: DC

## 2016-06-03 NOTE — ED Notes (Signed)
Pt transported to MRI 

## 2016-06-03 NOTE — Code Documentation (Signed)
56 y.o. Male arrives to [facility] via [facility] as code stroke. Pt was at [facility] this morning where he underwent an ablation of his lumbar faucets joints. After the procedure was over, the pt reported that his speech was off and had an abnormal gait. The gait is said to be a normal finding for the type of procedure performed. The speech difficulties were said to be acute and begun after procedure. Code Stroke was called. Upon arrival to [facility], the pt was taken for a head CT, labs obtained and NIHSS performed. See EMR for NIHSS and code stroke times. Upon assessment, pt had no difficulties and stated he is feeling better. tPA not given d/t symptoms being too mild to treat. Code stroke canceled per neurologist. Bedside handoff with ED RN Whitney.

## 2016-06-03 NOTE — Discharge Instructions (Signed)
Your MRI does not show evidence of acute stroke. 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 Due to this, start on a full, 325 mg aspirin per day. First, check with your neurosurgeon/spine surgeon to see if it is okay.

## 2016-06-03 NOTE — ED Notes (Signed)
EKG given to Dr. Goldston  

## 2016-06-03 NOTE — ED Triage Notes (Signed)
Pt brought in by EMS due to having stroke like symptoms at the neurovascular center during a procedure. Per EMS pt had abnormal gait and speech after procedure. Pt only received lidocaine and kenalog during the procedure.

## 2016-06-03 NOTE — Consult Note (Signed)
Requesting Physician: EDP    Chief Complaint: Slow Speech  History obtained from: Patient and Chart     HPI:                                                                                                                                         [name] is an 55 y.o. male with PMHx of T2DM, HTN, HLD, CAD s/p stent placement, and unstable angina who presented to the ED from the [facility] where he underwent a lumbar facet injection with Kenalog and Lidocaine this morning. Last known normal was 1000. After the procedure, patient sat upwards and noted slow speech and lightheadedness. He had an unsteady gait, but this reportedly normal and common for him after injection. The slowed speech persisted and EMS was called. Patient denies history of TIA or CVA. He denies other symptoms of confusion, loss of consciousness, numbness, weakness, vision or hearing changes. Patient presented to the ED at 1110. Patient states he did not receive any medications for anxiety prior to the procedure but did take a 50 mg Tramadol which he takes on a daily basis. No new medications. Patient was hypertensive on admission at 163/130 and reports not taking his blood pressure medications this morning.   Date last known well: Date: 06/03/2016 Time last known well: Time: 10:00 tPA Given: No: Mild slow speech- no other evidence of neurologic deficits No Symptoms         0 No significant disability/able to carry out all usual activities   1 Unable to carry out all previous activities but looks after own affairs             2 Requires help but walks without assistance     3 Unable to walk without assistance/unable to handle own bodily needs 4 Bedridden/incontinent        5 Dead          6  Modified Rankin: Rankin Score=0   Past Medical History:  Diagnosis Date  . Arthritis   . Chronic lower back pain   . Coronary artery disease    a. Multiple prior caths/PCI. Cath 2013 with possible spasm of RCA, 70% ISR  of mid LCx with subsequent DES to mLCx and prox LCX. b. H/o microvascular angina. c. Recurrent angina 08/2014 - s/p PTCA/DES to prox Cx, PTCA/CBA to OM1.  c. LHC 06/10/15 with patent stents and some ISR in LCX and OM-1 that was not flow limiting --> Rx   . Dyslipidemia    a. Intolerant to many statins except tolerating Livalo.  Marland Kitchen GERD (gastroesophageal reflux disease)   . Hypertension   . Myocardial infarction ~ 2010  . S/P angioplasty with stent, DES, to proximal and mid LCX 12/15/11 12/15/2011  . Type II diabetes mellitus ([place])     Past Surgical History:  Procedure Laterality Date  . CARDIAC CATHETERIZATION  06/15/2002  LAD with prox 40% stenosis, norma L main, Cfx with 25% lesion, RCA with long mid 25% stenosis (Dr. Vita Barley)  . CARDIAC CATHETERIZATION  04/01/2010   normal L main, LAD wit mild stenosis, L Cfx with 70% in-stent restenosis, RCA with 70% in-stent restenosis, LVEF >60% (Dr. K. Mali Hilty) - cutting ballon arthrectomy to RCA & Cfx (Dr. Rockne Menghini)  . CARDIAC CATHETERIZATION  08/25/2010   preserved global LV contractility; multivessel CAD, diffuse 90-95% in-stent restenosis in prox placed Cfx stent - cutting balloon arthrectomy in Cfx with multiple dilatations 90-95% to 0% stenosis (Dr. Corky Downs)  . CARDIAC CATHETERIZATION  01/26/2011   PCI & stenting of aggresive in-stent restenosis within previously stented AV groove Cfx with 3.0x62mm Taxus DES (previous stents were Promus) (Dr. Adora Fridge)  . CARDIAC CATHETERIZATION  05/11/2011   preserved LV function, 40% mid LAD stenosis, 30-40% narrowing proximal to stented semgnet of prox Cfx, patent mid RCA stent with smooth 20% narrowing in distal RCA (Dr. Corky Downs)  . CARDIAC CATHETERIZATION  12/15/2011   PCI & stenting of proximal & mid Cfx with DES - 3.0x109mm in proximal, 3.0x42mm in mid (Dr. Adora Fridge)  . CARDIAC CATHETERIZATION N/A 06/10/2015   Procedure: Left Heart Cath and Coronary Angiography;  Surgeon: Jolaine Artist, MD;   Location: [facility];  Service: Cardiovascular;  Laterality: N/A;  . cardiometabolic testing  123456   good exercise effort, peak VO2 79% predicted with normal VO2 HR curves (mild deconditioning)  . EXCISIONAL HEMORRHOIDECTOMY  1984  . LEFT HEART CATHETERIZATION WITH CORONARY ANGIOGRAM N/A 05/11/2011   Procedure: LEFT HEART CATHETERIZATION WITH CORONARY ANGIOGRAM;  Surgeon: Troy Sine, MD;  Location: [facility] CATH LAB;  Service: Cardiovascular;  Laterality: N/A;  Possible percutaneous coronary intervention, possible IVUS  . LEFT HEART CATHETERIZATION WITH CORONARY ANGIOGRAM N/A 12/15/2011   Procedure: LEFT HEART CATHETERIZATION WITH CORONARY ANGIOGRAM;  Surgeon: Lorretta Harp, MD;  Location: [facility] CATH LAB;  Service: Cardiovascular;  Laterality: N/A;  . LEFT HEART CATHETERIZATION WITH CORONARY ANGIOGRAM N/A 09/05/2014   Procedure: LEFT HEART CATHETERIZATION WITH CORONARY ANGIOGRAM;  Surgeon: Burnell Blanks, MD;  Location: [facility] CATH LAB;  Service: Cardiovascular;  Laterality: N/A;  . LIPOMA EXCISION     back of the head  . NM MYOCAR PERF WALL MOTION  02/2012   lexiscan myoview; mild perfusion defect in mid inferolateral & basal inferolateral region (infarct/scar); EF 52%, abnormal but ow risk scan  . PERCUTANEOUS CORONARY STENT INTERVENTION (PCI-S)  09/05/2014   Procedure: PERCUTANEOUS CORONARY STENT INTERVENTION (PCI-S);  Surgeon: Burnell Blanks, MD;  Location: [facility] CATH LAB;  Service: Cardiovascular;;    Family History  Problem Relation Age of Onset  . Leukemia Mother   . Prostate cancer Father   . Coronary artery disease Paternal Grandmother   . Cancer Paternal Grandfather   . Cancer Brother    Social History:  reports that he quit smoking about 7 years ago. His smoking use included Cigarettes. He has a 10.00 pack-year smoking history. He has never used smokeless tobacco. He reports that he does not drink alcohol or use drugs.  Stroke Risk Factors - diabetes mellitus,  hyperlipidemia and hypertension  Allergies:  Allergies  Allergen Reactions  . Bee Venom Anaphylaxis and Hives  . Shellfish Allergy Anaphylaxis and Hives  . Statins Other (See Comments)    Myalgias. Tolerating livalo.     Medications:  Prior to Admission: ASA, Coreg, Lisinopril, Metformin, Mirtazapine, Tramadol, Sildenafil, Gabapentin, Flexeril, Nitro prn, and Livalo.    ROS:                                                                                                                                       History obtained from chart review and the patient  A complete ROS was negative except as per HPI.   Neurologic Examination:                                                                                                      Blood pressure (!) 157/110, pulse 61, temperature 97.7 F (36.5 C), temperature source Oral, resp. rate 14, height 5\' 11"  (1.803 m), weight 188 lb 11.4 oz (85.6 kg), SpO2 97 %.  Vitals:   06/03/16 1100 06/03/16 1119 06/03/16 1131  BP:   (!) 157/110  Pulse:   61  Resp:   14  Temp:   97.7 F (36.5 C)  TempSrc:   Oral  SpO2:   97%  Weight: 188 lb 11.4 oz (85.6 kg)    Height:  5\' 11"  (1.803 m)    General: Vital signs reviewed.  Patient is well-developed and well-nourished, in no acute distress and cooperative with exam.  Head: Normocephalic and atraumatic. Eyes: EOMI, conjunctivae normal, no scleral icterus.  Neck: Supple, trachea midline, no carotid bruit present.  Cardiovascular: RRR Pulmonary/Chest: Clear to anterior auscultation bilaterally, no wheezes, rales, or rhonchi. Abdominal: Soft, non-tender, non-distended, BS + Extremities: No lower extremity edema bilaterally,  pulses symmetric and intact bilaterally.  Skin: Warm, dry and intact. No rashes or erythema. Psychiatric: Normal mood and affect. speech and behavior is normal.  Cognition and memory are normal.    Neurological Examination Mental Status: Alert, oriented, thought content appropriate.  Speech slow but without evidence of aphasia.  Able to follow 3 step commands without difficulty. Cranial Nerves: II: Visual fields grossly normal  III,IV, VI: ptosis not present, extra-ocular motions intact bilaterally, pupils equal, round, reactive to light and accommodation V,VII: smile symmetric, facial light touch sensation normal bilaterally VIII: hearing normal bilaterally IX,X: uvula rises symmetrically XI: bilateral shoulder shrug XII: midline tongue extension Motor: Right : Upper extremity   5/5    Left:     Upper extremity   5/5  Lower extremity   5/5     Lower extremity   5/5 Tone and bulk: normal tone throughout; no atrophy noted Sensory: Light touch intact throughout, bilaterally  Plantars: Right: downgoing   Left: downgoing Cerebellar: normal finger-to-nose and normal heel-to-shin test   Lab Results: Basic Metabolic Panel:  Recent Labs Lab 06/03/16 1116  NA 137  K 4.8  CL 103  GLUCOSE 182*  BUN 7  CREATININE 0.80   CBC:  Recent Labs Lab 06/03/16 1109 06/03/16 1116  WBC 5.2  --   NEUTROABS 2.3  --   HGB 15.4 16.0  HCT 45.4 47.0  MCV 84.1  --   PLT 243  --    CBG:  Recent Labs Lab 06/03/16 1118  GLUCAP 174*    Microbiology: Results for orders placed or performed during the hospital encounter of 04/25/11  Urine culture     Status: None   Collection Time: 04/25/11  3:02 PM  Result Value Ref Range Status   Specimen Description URINE, CLEAN CATCH  Final   Special Requests NONE  Final   Culture  Setup Time [id number]  Final   Colony Count NO GROWTH  Final   Culture NO GROWTH  Final   Report Status 04/26/2011 FINAL  Final   Imaging: Ct Head Code Stroke W/o Cm  Result Date: 06/03/2016 CLINICAL DATA:  Code stroke. Patient developed abnormal gait and speech difficulty after a procedure receiving Kenalog and lidocaine  earlier today. EXAM: CT HEAD WITHOUT CONTRAST TECHNIQUE: Contiguous axial images were obtained from the base of the skull through the vertex without intravenous contrast. COMPARISON:  None. FINDINGS: Brain: No evidence for acute infarction, hemorrhage, mass lesion, hydrocephalus, or extra-axial fluid. Normal for age cerebral volume. Hypoattenuation of white matter suggesting small vessel disease. Vascular: No hyperdense vessel or unexpected calcification. Skull: Normal. Negative for fracture or focal lesion. Sinuses/Orbits: No acute finding. Other: None. ASPECTS [facility] Stroke Program Early CT Score) - Ganglionic level infarction (caudate, lentiform nuclei, internal capsule, insula, M1-M3 cortex): 7 - Supraganglionic infarction (M4-M6 cortex): 3 Total score (0-10 with 10 being normal): 10 IMPRESSION: 1. No acute intracranial findings. Mild small vessel disease is suggested. 2. ASPECTS is 10. These results were called by telephone at the time of interpretation on 06/03/2016 at 11:22 am to Dr. Leonel Ramsay , who verbally acknowledged these results. Electronically Signed   By: Staci Righter M.D.   On: 06/03/2016 11:27      Assessment: 55 y.o. male with PMHx of CAD s/p PCI, HTN, HLD, and T2DM presenting with slow speech after lumbar facet injection. No other deficits noted on examination. Slow speech is not characteristic of expressive aphasia. CT Head without acute intracranial findings and positive for mild small vessel disease suggested. Work pursue further work up with MRI brain and further work up pending results. This could be a side effect of the procedure. Hypoglycemia ruled out. Code Stroke Cancelled.  Plan:  -MRI Brain wo contrast -UA -UDS -NPO -EKG -Further work up pending MRI results  Assessment and plan discussed with with attending physician and they are in agreement.   Martyn Malay, DO PGY-3 Internal Medicine Resident Pager # [phone] 06/03/2016 11:45 AM

## 2016-06-03 NOTE — ED Provider Notes (Signed)
[facility] DEPT Provider Note   CSN: [id number] Arrival date & time: 06/03/16  1104     History   Chief Complaint Chief Complaint  Patient presents with  . Code Stroke    HPI [name] is a 55 y.o. male.  HPI  55 year old male presents with acute dizziness and slurred speech. Code stroke called by EMS. Last known well was around 10:30 AM. He was having an outpatient spinal procedure. He was getting a facet joint block according to EMS and patient. Per them he was given Kenalog and lidocaine. Patient states that after he sat up from this procedure he felt dizzy and was having trouble speaking. He felt like his speaking slower. This is similar in not much improved since onset. The dizziness is no longer present. It felt like a room spinning sensation. There was never a headache, neck pain, or chest pain. No focal weakness. Patient and EMS stated he was unsteady when sitting up and looked like he was going to fall over. His back pain is much better. He took tramadol this morning which is not atypical for him. However it has never caused symptoms like this. He states he was not given any other medicines orally or by IV.  Past Medical History:  Diagnosis Date  . Arthritis   . Chronic lower back pain   . Coronary artery disease    a. Multiple prior caths/PCI. Cath 2013 with possible spasm of RCA, 70% ISR of mid LCx with subsequent DES to mLCx and prox LCX. b. H/o microvascular angina. c. Recurrent angina 08/2014 - s/p PTCA/DES to prox Cx, PTCA/CBA to OM1.  c. LHC 06/10/15 with patent stents and some ISR in LCX and OM-1 that was not flow limiting --> Rx   . Dyslipidemia    a. Intolerant to many statins except tolerating Livalo.  Marland Kitchen GERD (gastroesophageal reflux disease)   . Hypertension   . Myocardial infarction ~ 2010  . S/P angioplasty with stent, DES, to proximal and mid LCX 12/15/11 12/15/2011  . Type II diabetes mellitus [facility])     Patient Active Problem List   Diagnosis Date  Noted  . Essential hypertension 01/13/2016  . Coronary artery disease involving native coronary artery of native heart without angina pectoris 01/13/2016  . Unstable angina ([place])   . ED (erectile dysfunction) 02/15/2014  . Allergy to contrast media (used for diagnostic x-rays) 05/12/2011  . Hypertension 05/09/2011  . Hyperlipidemia 05/09/2011  . DM2 (diabetes mellitus, type 2) ([place]) 07/10/2010  . CAD S/P percutaneous coronary angioplasty 07/10/2010  . DYSPEPSIA&OTHER [facility] DISORDERS FUNCTION STOMACH 07/10/2010    Past Surgical History:  Procedure Laterality Date  . CARDIAC CATHETERIZATION  06/15/2002   LAD with prox 40% stenosis, norma L main, Cfx with 25% lesion, RCA with long mid 25% stenosis (Dr. Vita Barley)  . CARDIAC CATHETERIZATION  04/01/2010   normal L main, LAD wit mild stenosis, L Cfx with 70% in-stent restenosis, RCA with 70% in-stent restenosis, LVEF >60% (Dr. K. Mali Hilty) - cutting ballon arthrectomy to RCA & Cfx (Dr. Rockne Menghini)  . CARDIAC CATHETERIZATION  08/25/2010   preserved global LV contractility; multivessel CAD, diffuse 90-95% in-stent restenosis in prox placed Cfx stent - cutting balloon arthrectomy in Cfx with multiple dilatations 90-95% to 0% stenosis (Dr. Corky Downs)  . CARDIAC CATHETERIZATION  01/26/2011   PCI & stenting of aggresive in-stent restenosis within previously stented AV groove Cfx with 3.0x81mm Taxus DES (previous stents were Promus) (Dr. Adora Fridge)  .  CARDIAC CATHETERIZATION  05/11/2011   preserved LV function, 40% mid LAD stenosis, 30-40% narrowing proximal to stented semgnet of prox Cfx, patent mid RCA stent with smooth 20% narrowing in distal RCA (Dr. Corky Downs)  . CARDIAC CATHETERIZATION  12/15/2011   PCI & stenting of proximal & mid Cfx with DES - 3.0x67mm in proximal, 3.0x66mm in mid (Dr. Adora Fridge)  . CARDIAC CATHETERIZATION N/A 06/10/2015   Procedure: Left Heart Cath and Coronary Angiography;  Surgeon: Jolaine Artist, MD;  Location: [facility]  CV LAB;  Service: Cardiovascular;  Laterality: N/A;  . cardiometabolic testing  123456   good exercise effort, peak VO2 79% predicted with normal VO2 HR curves (mild deconditioning)  . EXCISIONAL HEMORRHOIDECTOMY  1984  . LEFT HEART CATHETERIZATION WITH CORONARY ANGIOGRAM N/A 05/11/2011   Procedure: LEFT HEART CATHETERIZATION WITH CORONARY ANGIOGRAM;  Surgeon: Troy Sine, MD;  Location: [facility] CATH LAB;  Service: Cardiovascular;  Laterality: N/A;  Possible percutaneous coronary intervention, possible IVUS  . LEFT HEART CATHETERIZATION WITH CORONARY ANGIOGRAM N/A 12/15/2011   Procedure: LEFT HEART CATHETERIZATION WITH CORONARY ANGIOGRAM;  Surgeon: Lorretta Harp, MD;  Location: [facility] CATH LAB;  Service: Cardiovascular;  Laterality: N/A;  . LEFT HEART CATHETERIZATION WITH CORONARY ANGIOGRAM N/A 09/05/2014   Procedure: LEFT HEART CATHETERIZATION WITH CORONARY ANGIOGRAM;  Surgeon: Burnell Blanks, MD;  Location: [facility] CATH LAB;  Service: Cardiovascular;  Laterality: N/A;  . LIPOMA EXCISION     back of the head  . NM MYOCAR PERF WALL MOTION  02/2012   lexiscan myoview; mild perfusion defect in mid inferolateral & basal inferolateral region (infarct/scar); EF 52%, abnormal but ow risk scan  . PERCUTANEOUS CORONARY STENT INTERVENTION (PCI-S)  09/05/2014   Procedure: PERCUTANEOUS CORONARY STENT INTERVENTION (PCI-S);  Surgeon: Burnell Blanks, MD;  Location: [facility] CATH LAB;  Service: Cardiovascular;;       Home Medications    Prior to Admission medications   Medication Sig Start Date End Date Taking? Authorizing Provider  carvedilol (COREG) 12.5 MG tablet Take 1 tablet (12.5 mg total) by mouth 2 (two) times daily with a meal. 02/06/15  Yes Pixie Casino, MD  cyclobenzaprine (FLEXERIL) 10 MG tablet Take 1 tablet (10 mg total) by mouth 3 (three) times daily as needed for muscle spasms. 07/11/15  Yes Luke K Kilroy, PA-C  gabapentin (NEURONTIN) 300 MG capsule Take 2 capsules (600 mg total) by mouth  at bedtime. 10/02/14  Yes [name], MD  lisinopril (PRINIVIL,ZESTRIL) 5 MG tablet Take 5 mg by mouth daily.   Yes Historical Provider, MD  LIVALO 4 MG TABS TAKE ONE TABLET BY MOUTH ONCE DAILY 12/04/14  Yes Pixie Casino, MD  meloxicam (MOBIC) 7.5 MG tablet Take 7.5 mg by mouth daily.   Yes Historical Provider, MD  metFORMIN (GLUMETZA) 1000 MG (MOD) 24 hr tablet Take 1 tablet (1,000 mg total) by mouth daily with breakfast. 06/12/15  Yes Eileen Stanford, PA-C  mirtazapine (REMERON) 30 MG tablet Take 30 mg by mouth at bedtime.   Yes Historical Provider, MD  nitroGLYCERIN (NITROSTAT) 0.4 MG SL tablet Place 1 tablet (0.4 mg total) under the tongue every 5 (five) minutes as needed. For chest pain 08/28/13  Yes Pixie Casino, MD  Omega-3 Fatty Acids (FISH OIL) 1200 MG CAPS Take 1 capsule (1,200 mg total) by mouth daily. 07/11/15  Yes Erlene Quan, PA-C  sildenafil (REVATIO) 20 MG tablet Take 2 to 3 tablets as needed for sexual activity 07/11/15  Yes Lurena Joiner  K Kilroy, PA-C  traMADol (ULTRAM) 50 MG tablet Take 50 mg by mouth 2 (two) times daily. For pain   Yes Historical Provider, MD  aspirin EC 325 MG tablet Take 1 tablet (325 mg total) by mouth daily. 06/03/16   Sherwood Gambler, MD    Family History Family History  Problem Relation Age of Onset  . Leukemia Mother   . Prostate cancer Father   . Coronary artery disease Paternal Grandmother   . Cancer Paternal Grandfather   . Cancer Brother     Social History Social History  Substance Use Topics  . Smoking status: Former Smoker    Packs/day: 1.00    Years: 10.00    Types: Cigarettes    Quit date: 04/07/2009  . Smokeless tobacco: Never Used  . Alcohol use No     Comment: 12/15/11 "til 2010, drank a pint of gin/day at least"     Allergies   Bee venom; Shellfish allergy; and Statins   Review of Systems Review of Systems  Respiratory: Negative for shortness of breath.   Cardiovascular: Negative for chest pain.  Gastrointestinal: Negative  for abdominal pain and vomiting.  Musculoskeletal: Negative for back pain and neck pain.  Neurological: Positive for dizziness and speech difficulty. Negative for weakness, light-headedness, numbness and headaches.  All other systems reviewed and are negative.    Physical Exam Updated Vital Signs BP (!) 143/104   Pulse 67   Temp 97.7 F (36.5 C) (Oral)   Resp 13   Ht 5\' 11"  (1.803 m)   Wt 188 lb 11.4 oz (85.6 kg)   SpO2 96%   BMI 26.32 kg/m   Physical Exam  Constitutional: He is oriented to person, place, and time. He appears well-developed and well-nourished.  HENT:  Head: Normocephalic and atraumatic.  Right Ear: External ear normal.  Left Ear: External ear normal.  Nose: Nose normal.  Eyes: EOM are normal. Pupils are equal, round, and reactive to light. Right eye exhibits no discharge. Left eye exhibits no discharge.  Neck: Neck supple.  Cardiovascular: Normal rate, regular rhythm and normal heart sounds.   Pulmonary/Chest: Effort normal and breath sounds normal.  Abdominal: Soft. There is no tenderness.  Musculoskeletal: He exhibits no edema.  Neurological: He is alert and oriented to person, place, and time.  CN 3-12 grossly intact. 5/5 strength in all 4 extremities. Grossly normal sensation. Normal finger to nose. Speech is slowed but slurring, difficulty getting words out or facial droop  Skin: Skin is warm and dry.  Nursing note and vitals reviewed.    ED Treatments / Results  Labs (all labs ordered are listed, but only abnormal results are displayed) Labs Reviewed  COMPREHENSIVE METABOLIC PANEL - Abnormal; Notable for the following:       Result Value   Glucose, Bld 189 (*)    All other components within normal limits  I-STAT CHEM 8, ED - Abnormal; Notable for the following:    Glucose, Bld 182 (*)    Calcium, Ion 1.14 (*)    All other components within normal limits  CBG MONITORING, ED - Abnormal; Notable for the following:    Glucose-Capillary 174 (*)      All other components within normal limits  ETHANOL  PROTIME-INR  APTT  CBC  DIFFERENTIAL  RAPID URINE DRUG SCREEN, HOSP PERFORMED  URINALYSIS, ROUTINE W REFLEX MICROSCOPIC  I-STAT TROPOININ, ED    EKG  EKG Interpretation  Date/Time:  Wednesday June 03 2016 11:29:10 EST Ventricular Rate:  65 PR Interval:    QRS Duration: 89 QT Interval:  364 QTC Calculation: 379 R Axis:   3 Text Interpretation:  Normal sinus rhythm RSR' in V1 or V2, right VCD or RVH Borderline ST elevation, anterior leads no significant change since Dec 2016 Confirmed by Regenia Skeeter MD, [place] Hogsett [phone]) on 06/03/2016 12:37:18 PM       Radiology Mr Brain Wo Contrast (neuro Protocol)  Result Date: 06/03/2016 CLINICAL DATA:  Acute dizziness and slurred speech after facet injection. EXAM: MRI HEAD WITHOUT CONTRAST TECHNIQUE: Multiplanar, multiecho pulse sequences of the brain and surrounding structures were obtained without intravenous contrast. COMPARISON:  Head CT from earlier today FINDINGS: Brain: No acute infarction, hemorrhage, hydrocephalus, extra-axial collection or mass lesion. Tiny remote infarct in the right cerebellum seen on axial T2. No chronic microvascular ischemic change noted in the cerebral white matter. Normal brain volume. Vascular: Preserved flow voids. Skull and upper cervical spine: Negative Sinuses/Orbits: Negative IMPRESSION: 1. No acute finding, including infarct. 2. Remote tiny right cerebellar infarct. Electronically Signed   By: Monte Fantasia M.D.   On: 06/03/2016 13:24   Ct Head Code Stroke W/o Cm  Result Date: 06/03/2016 CLINICAL DATA:  Code stroke. Patient developed abnormal gait and speech difficulty after a procedure receiving Kenalog and lidocaine earlier today. EXAM: CT HEAD WITHOUT CONTRAST TECHNIQUE: Contiguous axial images were obtained from the base of the skull through the vertex without intravenous contrast. COMPARISON:  None. FINDINGS: Brain: No evidence for acute infarction,  hemorrhage, mass lesion, hydrocephalus, or extra-axial fluid. Normal for age cerebral volume. Hypoattenuation of white matter suggesting small vessel disease. Vascular: No hyperdense vessel or unexpected calcification. Skull: Normal. Negative for fracture or focal lesion. Sinuses/Orbits: No acute finding. Other: None. ASPECTS [facility] Stroke Program Early CT Score) - Ganglionic level infarction (caudate, lentiform nuclei, internal capsule, insula, M1-M3 cortex): 7 - Supraganglionic infarction (M4-M6 cortex): 3 Total score (0-10 with 10 being normal): 10 IMPRESSION: 1. No acute intracranial findings. Mild small vessel disease is suggested. 2. ASPECTS is 10. These results were called by telephone at the time of interpretation on 06/03/2016 at 11:22 am to Dr. Leonel Ramsay , who verbally acknowledged these results. Electronically Signed   By: Staci Righter M.D.   On: 06/03/2016 11:27    Procedures Procedures (including critical care time)  Medications Ordered in ED Medications - No data to display   Initial Impression / Assessment and Plan / ED Course  I have reviewed the triage vital signs and the nursing notes.  Pertinent labs & imaging results that were available during my care of the patient were reviewed by me and considered in my medical decision making (see chart for details).  Clinical Course as of Jun 03 1416  Wed Jun 03, 2016  1129 Medication side effect vs near syncope vs TIA/CVA. No indication for TPA per neuro, get stat MRI  [SG]    Clinical Course User Index [SG] Sherwood Gambler, MD    Patient feels much better. He does feel little bit dizzy but he is able to ambulate in a straight line without difficulty. He feels like his speech is back to normal and his speech does sound more normal to me. His only speech abnormality was that it was slower, there was no slurred speech or trouble getting certain words out. I do not think this is neurologic. Possibly a side effect of his recent  procedure or the medicines given. However my suspicion for cardiac cause or acute CNS pathology is low. Discharge  home. Call his neurosurgeon to see if he can take aspirin given the old stroke seen on MRI.  Final Clinical Impressions(s) / ED Diagnoses   Final diagnoses:  Dizziness  Speech disturbance, unspecified type    New Prescriptions New Prescriptions   ASPIRIN EC 325 MG TABLET    Take 1 tablet (325 mg total) by mouth daily.     Sherwood Gambler, MD 06/03/16 [phone]

## 2016-06-03 NOTE — ED Notes (Signed)
Pt's CBG 174.  Informed Whitney, RN.

## 2016-10-11 ENCOUNTER — Encounter (HOSPITAL_COMMUNITY): Payer: Self-pay | Admitting: Emergency Medicine

## 2016-10-11 ENCOUNTER — Emergency Department (HOSPITAL_COMMUNITY): Payer: Medicaid Other

## 2016-10-11 ENCOUNTER — Emergency Department (HOSPITAL_COMMUNITY)
Admission: EM | Admit: 2016-10-11 | Discharge: 2016-10-11 | Disposition: A | Discharge status: Home / Self Care | Payer: Medicaid Other | Attending: Emergency Medicine | Admitting: Emergency Medicine

## 2016-10-11 DIAGNOSIS — E119 Type 2 diabetes mellitus without complications: Secondary | ICD-10-CM | POA: Insufficient documentation

## 2016-10-11 DIAGNOSIS — Z7984 Long term (current) use of oral hypoglycemic drugs: Secondary | ICD-10-CM | POA: Diagnosis not present

## 2016-10-11 DIAGNOSIS — Z87891 Personal history of nicotine dependence: Secondary | ICD-10-CM | POA: Diagnosis not present

## 2016-10-11 DIAGNOSIS — I2510 Atherosclerotic heart disease of native coronary artery without angina pectoris: Secondary | ICD-10-CM | POA: Insufficient documentation

## 2016-10-11 DIAGNOSIS — S40011A Contusion of right shoulder, initial encounter: Secondary | ICD-10-CM | POA: Insufficient documentation

## 2016-10-11 DIAGNOSIS — G8929 Other chronic pain: Secondary | ICD-10-CM | POA: Diagnosis not present

## 2016-10-11 DIAGNOSIS — I252 Old myocardial infarction: Secondary | ICD-10-CM | POA: Insufficient documentation

## 2016-10-11 DIAGNOSIS — Y92410 Unspecified street and highway as the place of occurrence of the external cause: Secondary | ICD-10-CM | POA: Insufficient documentation

## 2016-10-11 DIAGNOSIS — S4991XA Unspecified injury of right shoulder and upper arm, initial encounter: Secondary | ICD-10-CM | POA: Diagnosis present

## 2016-10-11 DIAGNOSIS — Y939 Activity, unspecified: Secondary | ICD-10-CM | POA: Diagnosis not present

## 2016-10-11 DIAGNOSIS — Y999 Unspecified external cause status: Secondary | ICD-10-CM | POA: Diagnosis not present

## 2016-10-11 DIAGNOSIS — Z7982 Long term (current) use of aspirin: Secondary | ICD-10-CM | POA: Diagnosis not present

## 2016-10-11 DIAGNOSIS — M25512 Pain in left shoulder: Secondary | ICD-10-CM | POA: Insufficient documentation

## 2016-10-11 MED ORDER — NAPROXEN 375 MG PO TABS: 375.0000 mg | ORAL_TABLET | Freq: Two times a day (BID) | ORAL | 0 refills | Status: DC

## 2016-10-11 NOTE — ED Triage Notes (Signed)
Pt. Stated, I have 2 shoulders that are injured, left is old injury, rt. Shoulder is new-53days old, [place]

## 2016-10-11 NOTE — ED Notes (Signed)
ED Provider at bedside. 

## 2016-10-11 NOTE — ED Notes (Signed)
Patient transported to X-ray 
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
## 2016-10-13 ENCOUNTER — Encounter (INDEPENDENT_AMBULATORY_CARE_PROVIDER_SITE_OTHER): Payer: Self-pay | Admitting: Specialist

## 2016-10-13 ENCOUNTER — Ambulatory Visit (INDEPENDENT_AMBULATORY_CARE_PROVIDER_SITE_OTHER): Payer: Medicaid Other | Admitting: Specialist

## 2016-10-13 ENCOUNTER — Ambulatory Visit (INDEPENDENT_AMBULATORY_CARE_PROVIDER_SITE_OTHER): Payer: Medicaid Other

## 2016-10-13 VITALS — BP 123/87 | HR 67 | Ht 68.0 in | Wt 175.0 lb

## 2016-10-13 DIAGNOSIS — M25511 Pain in right shoulder: Secondary | ICD-10-CM

## 2016-10-13 MED ORDER — ACETAMINOPHEN-CODEINE #4 300-60 MG PO TABS: 1.0000 | ORAL_TABLET | Freq: Three times a day (TID) | ORAL | 0 refills | Status: DC | PRN
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
## 2016-10-16 ENCOUNTER — Telehealth (INDEPENDENT_AMBULATORY_CARE_PROVIDER_SITE_OTHER): Payer: Self-pay | Admitting: *Deleted

## 2016-10-16 NOTE — Telephone Encounter (Signed)
Pt has appt scheduled at [facility] for CT arthrrogram of Rigth shoulder on Monday April 23 at 2:00p, pt needs to arrive 20 mins early to register and be off Aspirin 325 mg 48 hrs prior to appt. Left message on vm to return my call.

## 2016-10-18 ENCOUNTER — Emergency Department (HOSPITAL_COMMUNITY)
Admission: EM | Admit: 2016-10-18 | Discharge: 2016-10-18 | Disposition: A | Discharge status: Home / Self Care | Payer: Medicaid Other | Attending: Emergency Medicine | Admitting: Emergency Medicine

## 2016-10-18 ENCOUNTER — Encounter (HOSPITAL_COMMUNITY): Payer: Self-pay | Admitting: Emergency Medicine

## 2016-10-18 DIAGNOSIS — I2510 Atherosclerotic heart disease of native coronary artery without angina pectoris: Secondary | ICD-10-CM | POA: Insufficient documentation

## 2016-10-18 DIAGNOSIS — Z79899 Other long term (current) drug therapy: Secondary | ICD-10-CM | POA: Diagnosis not present

## 2016-10-18 DIAGNOSIS — Z87891 Personal history of nicotine dependence: Secondary | ICD-10-CM | POA: Insufficient documentation

## 2016-10-18 DIAGNOSIS — Z7982 Long term (current) use of aspirin: Secondary | ICD-10-CM | POA: Insufficient documentation

## 2016-10-18 DIAGNOSIS — Z7984 Long term (current) use of oral hypoglycemic drugs: Secondary | ICD-10-CM | POA: Diagnosis not present

## 2016-10-18 DIAGNOSIS — J029 Acute pharyngitis, unspecified: Secondary | ICD-10-CM

## 2016-10-18 DIAGNOSIS — E119 Type 2 diabetes mellitus without complications: Secondary | ICD-10-CM | POA: Insufficient documentation

## 2016-10-18 DIAGNOSIS — I252 Old myocardial infarction: Secondary | ICD-10-CM | POA: Insufficient documentation

## 2016-10-18 DIAGNOSIS — R0789 Other chest pain: Secondary | ICD-10-CM | POA: Insufficient documentation

## 2016-10-18 DIAGNOSIS — R05 Cough: Secondary | ICD-10-CM | POA: Diagnosis present

## 2016-10-18 DIAGNOSIS — R059 Cough, unspecified: Secondary | ICD-10-CM

## 2016-10-18 NOTE — ED Provider Notes (Signed)
[facility] DEPT Provider Note   CSN: [id number] Arrival date & time: 10/18/16  5176    By signing my name below, I, Mayer Masker, attest that this documentation has been prepared under the direction and in the presence of Wyn Quaker, [place]. Electronically Signed: Mayer Masker, Scribe. 10/18/16. 10:02 AM.  History   Chief Complaint Chief Complaint  Patient presents with  . Cough  . Nasal Congestion   HPI Comments: [name] is a 56 y.o. male who presents to the Emergency Department complaining of gradually worsening, intermittent productive cough with green/brown sputum for 3-4 days. Pt reports associated nasal/chest congestion, rhinorrhea, loss of appetite. He states he has chest tightness only when coughing. He states this chest tightness is unaffected by touch or deep breathing. He does not feel like this is consistent with previous cardiac episodes or angina.  He denies taking any medication to improve symptoms. Pt reports h/o seasonal allergies. Pt denies hot flashes, cold chills, night sweats, SOB, ear pain, eye drainage, abdominal pain, heart palpitations, edema of lower extremities. No h/o chronic bronchitis, emphysema, or COPD. Pt has a history of smoking 1 cigarette/week for two months but he used to smoke a pack/day.    The history is provided by the patient. No language interpreter was used.    Past Medical History:  Diagnosis Date  . Arthritis   . Chronic lower back pain   . Coronary artery disease    a. Multiple prior caths/PCI. Cath 2013 with possible spasm of RCA, 70% ISR of mid LCx with subsequent DES to mLCx and prox LCX. b. H/o microvascular angina. c. Recurrent angina 08/2014 - s/p PTCA/DES to prox Cx, PTCA/CBA to OM1.  c. LHC 06/10/15 with patent stents and some ISR in LCX and OM-1 that was not flow limiting --> Rx   . Dyslipidemia    a. Intolerant to many statins except tolerating Livalo.  Marland Kitchen GERD (gastroesophageal reflux disease)   . Hypertension     . Myocardial infarction ([place]) ~ 2010  . S/P angioplasty with stent, DES, to proximal and mid LCX 12/15/11 12/15/2011  . Type II diabetes mellitus [facility])     Patient Active Problem List   Diagnosis Date Noted  . Essential hypertension 01/13/2016  . Coronary artery disease involving native coronary artery of native heart without angina pectoris 01/13/2016  . Unstable angina ([place])   . ED (erectile dysfunction) 02/15/2014  . Allergy to contrast media (used for diagnostic x-rays) 05/12/2011  . Hypertension 05/09/2011  . Hyperlipidemia 05/09/2011  . DM2 (diabetes mellitus, type 2) ([place]) 07/10/2010  . CAD S/P percutaneous coronary angioplasty 07/10/2010  . DYSPEPSIA&OTHER [facility] DISORDERS FUNCTION STOMACH 07/10/2010    Past Surgical History:  Procedure Laterality Date  . CARDIAC CATHETERIZATION  06/15/2002   LAD with prox 40% stenosis, norma L main, Cfx with 25% lesion, RCA with long mid 25% stenosis (Dr. Vita Barley)  . CARDIAC CATHETERIZATION  04/01/2010   normal L main, LAD wit mild stenosis, L Cfx with 70% in-stent restenosis, RCA with 70% in-stent restenosis, LVEF >60% (Dr. K. Mali Hilty) - cutting ballon arthrectomy to RCA & Cfx (Dr. Rockne Menghini)  . CARDIAC CATHETERIZATION  08/25/2010   preserved global LV contractility; multivessel CAD, diffuse 90-95% in-stent restenosis in prox placed Cfx stent - cutting balloon arthrectomy in Cfx with multiple dilatations 90-95% to 0% stenosis (Dr. Corky Downs)  . CARDIAC CATHETERIZATION  01/26/2011   PCI & stenting of aggresive in-stent restenosis within previously stented AV groove Cfx with  3.0x74mm Taxus DES (previous stents were Promus) (Dr. Adora Fridge)  . CARDIAC CATHETERIZATION  05/11/2011   preserved LV function, 40% mid LAD stenosis, 30-40% narrowing proximal to stented semgnet of prox Cfx, patent mid RCA stent with smooth 20% narrowing in distal RCA (Dr. Corky Downs)  . CARDIAC CATHETERIZATION  12/15/2011   PCI & stenting of proximal & mid Cfx with DES -  3.0x80mm in proximal, 3.0x22mm in mid (Dr. Adora Fridge)  . CARDIAC CATHETERIZATION N/A 06/10/2015   Procedure: Left Heart Cath and Coronary Angiography;  Surgeon: Jolaine Artist, MD;  Location: [facility] CV LAB;  Service: Cardiovascular;  Laterality: N/A;  . cardiometabolic testing  1/49/7026   good exercise effort, peak VO2 79% predicted with normal VO2 HR curves (mild deconditioning)  . EXCISIONAL HEMORRHOIDECTOMY  1984  . LEFT HEART CATHETERIZATION WITH CORONARY ANGIOGRAM N/A 05/11/2011   Procedure: LEFT HEART CATHETERIZATION WITH CORONARY ANGIOGRAM;  Surgeon: Troy Sine, MD;  Location: [facility] CATH LAB;  Service: Cardiovascular;  Laterality: N/A;  Possible percutaneous coronary intervention, possible IVUS  . LEFT HEART CATHETERIZATION WITH CORONARY ANGIOGRAM N/A 12/15/2011   Procedure: LEFT HEART CATHETERIZATION WITH CORONARY ANGIOGRAM;  Surgeon: Lorretta Harp, MD;  Location: [facility] CATH LAB;  Service: Cardiovascular;  Laterality: N/A;  . LEFT HEART CATHETERIZATION WITH CORONARY ANGIOGRAM N/A 09/05/2014   Procedure: LEFT HEART CATHETERIZATION WITH CORONARY ANGIOGRAM;  Surgeon: Burnell Blanks, MD;  Location: [facility] CATH LAB;  Service: Cardiovascular;  Laterality: N/A;  . LIPOMA EXCISION     back of the head  . NM MYOCAR PERF WALL MOTION  02/2012   lexiscan myoview; mild perfusion defect in mid inferolateral & basal inferolateral region (infarct/scar); EF 52%, abnormal but ow risk scan  . PERCUTANEOUS CORONARY STENT INTERVENTION (PCI-S)  09/05/2014   Procedure: PERCUTANEOUS CORONARY STENT INTERVENTION (PCI-S);  Surgeon: Burnell Blanks, MD;  Location: [facility] CATH LAB;  Service: Cardiovascular;;       Home Medications    Prior to Admission medications   Medication Sig Start Date End Date Taking? Authorizing Provider  acetaminophen-codeine (TYLENOL #4) 300-60 MG tablet Take 1 tablet by mouth every 8 (eight) hours as needed for moderate pain. 10/13/16   Lanae Crumbly, PA-C  aspirin EC 325  MG tablet Take 1 tablet (325 mg total) by mouth daily. Patient taking differently: Take 325 mg by mouth at bedtime.  06/03/16   Sherwood Gambler, MD  carvedilol (COREG) 12.5 MG tablet Take 1 tablet (12.5 mg total) by mouth 2 (two) times daily with a meal. Patient taking differently: Take 25 mg by mouth daily.  02/06/15   Pixie Casino, MD  Coenzyme Q10 (CO Q 10 PO) Take 1 capsule by mouth at bedtime.    Historical Provider, MD  cyclobenzaprine (FLEXERIL) 10 MG tablet Take 1 tablet (10 mg total) by mouth 3 (three) times daily as needed for muscle spasms. 07/11/15   Erlene Quan, PA-C  gabapentin (NEURONTIN) 300 MG capsule Take 2 capsules (600 mg total) by mouth at bedtime. Patient taking differently: Take 600 mg by mouth daily as needed (for pain).  10/02/14   [name], MD  lisinopril (PRINIVIL,ZESTRIL) 5 MG tablet Take 5 mg by mouth daily.    Historical Provider, MD  LIVALO 4 MG TABS TAKE ONE TABLET BY MOUTH ONCE DAILY 12/04/14   Pixie Casino, MD  metFORMIN (GLUMETZA) 1000 MG (MOD) 24 hr tablet Take 1 tablet (1,000 mg total) by mouth daily with breakfast. 06/12/15   Woodfin Ganja  Grandville Silos, PA-C  naproxen (NAPROSYN) 375 MG tablet Take 1 tablet (375 mg total) by mouth 2 (two) times daily. 10/11/16   Malvin Johns, MD  nitroGLYCERIN (NITROSTAT) 0.4 MG SL tablet Place 1 tablet (0.4 mg total) under the tongue every 5 (five) minutes as needed. For chest pain 08/28/13   Pixie Casino, MD  Omega-3 Fatty Acids (FISH OIL) 1200 MG CAPS Take 1 capsule (1,200 mg total) by mouth daily. 07/11/15   Erlene Quan, PA-C  sildenafil (REVATIO) 20 MG tablet Take 2 to 3 tablets as needed for sexual activity 07/11/15   Erlene Quan, PA-C    Family History Family History  Problem Relation Age of Onset  . Leukemia Mother   . Prostate cancer Father   . Coronary artery disease Paternal Grandmother   . Cancer Paternal Grandfather   . Cancer Brother     Social History Social History  Substance Use Topics  . Smoking  status: Former Smoker    Packs/day: 1.00    Years: 10.00    Types: Cigarettes    Quit date: 04/07/2009  . Smokeless tobacco: Never Used  . Alcohol use No     Comment: 12/15/11 "til 2010, drank a pint of gin/day at least"     Allergies   Bee venom; Shellfish allergy; and Statins   Review of Systems Review of Systems  Constitutional: Negative for chills, diaphoresis and fever.  HENT: Positive for congestion, rhinorrhea, sinus pressure and sore throat. Negative for dental problem, drooling, ear pain, mouth sores, postnasal drip, sinus pain and trouble swallowing.   Eyes: Negative for discharge and visual disturbance.  Respiratory: Positive for cough and chest tightness. Negative for shortness of breath.   Cardiovascular: Negative for chest pain, palpitations and leg swelling.  Gastrointestinal: Negative for abdominal distention, abdominal pain, diarrhea, nausea and vomiting.  Musculoskeletal: Negative for back pain, myalgias and neck stiffness.  Skin: Negative for color change.  Neurological: Negative for speech difficulty, light-headedness and headaches.     Physical Exam Updated Vital Signs BP (!) 139/99 (BP Location: Left Arm)   Pulse 86   Temp 99 F (37.2 C) (Oral)   Resp 16   Ht 5\' 8"  (1.727 m)   Wt 79.4 kg   SpO2 100%   BMI 26.61 kg/m   Physical Exam  Constitutional: He appears well-developed and well-nourished. No distress.  HENT:  Head: Normocephalic and atraumatic.  Right Ear: Tympanic membrane normal.  Left Ear: Tympanic membrane normal.  Nose: Right sinus exhibits no maxillary sinus tenderness and no frontal sinus tenderness. Left sinus exhibits no maxillary sinus tenderness and no frontal sinus tenderness.  Mouth/Throat: Posterior oropharyngeal erythema present. No oropharyngeal exudate or posterior oropharyngeal edema. No tonsillar exudate.  Pale boggy nasal mucosa.   Eyes: Conjunctivae are normal. No scleral icterus.  Neck: Normal range of motion. No  tracheal deviation present.  Cardiovascular: Normal rate, regular rhythm and normal heart sounds.  Exam reveals no gallop and no friction rub.   No murmur heard. Pulmonary/Chest: Effort normal and breath sounds normal. He has no wheezes. He has no rales. He exhibits no tenderness.  While in the room he coughed up a small amount of mucus without obvious blood.   Abdominal: Soft. Bowel sounds are normal. He exhibits no distension. There is no tenderness.  Musculoskeletal: He exhibits no tenderness or deformity.  Lymphadenopathy:    He has cervical adenopathy (left).  Neurological: He is alert.  Skin: Skin is warm and dry. He is not  diaphoretic.  Psychiatric: He has a normal mood and affect.  Nursing note and vitals reviewed.    ED Treatments / Results  DIAGNOSTIC STUDIES: Oxygen Saturation is 100% on RA, normal by my interpretation.    COORDINATION OF CARE: 9:55 AM Discussed treatment plan with pt which includes supportive care at bedside and pt agreed to plan.  Labs (all labs ordered are listed, but only abnormal results are displayed) Labs Reviewed - No data to display  EKG  EKG Interpretation None       Radiology No results found.  Procedures Procedures (including critical care time)  Medications Ordered in ED Medications - No data to display   Initial Impression / Assessment and Plan / ED Course  I have reviewed the triage vital signs and the nursing notes.  Pertinent labs & imaging results that were available during my care of the patient were reviewed by me and considered in my medical decision making (see chart for details).    Pt symptoms consistent with URI. Pt will be discharged with symptomatic treatment. Recommended humidifier and Netipot use, ibuprofen/Tylenol PRN, salt water gargles and cough drops as needed for symptomatic treatment. Discussed return precautions.  While patient has a significant cardiac history, he does not feel like this is his normal  cardiac chest pain, does not feel like he is having a heart attack.  With the pain being minor, "discomfort" and only present when he coughs with a productive cough my suspicion for cardiac cause of his discomfort is very low.  His primary complaint was his sore throat which is most likely from his cough and post nasal drip.  Pt is hemodynamically stable & in NAD prior to discharge.    Final Clinical Impressions(s) / ED Diagnoses   Final diagnoses:  Cough  Sore throat  Chest tightness    New Prescriptions Discharge Medication List as of 10/18/2016 10:03 AM      I personally performed the services described in this documentation, which was scribed in my presence. The recorded information has been reviewed and is accurate.    Lorin Glass, PA-C 10/18/16 [place], MD 10/20/16 1235

## 2016-10-18 NOTE — ED Triage Notes (Signed)
Onset 4 days ago developed nasal congestion, productive cough yellow green sputum, and sneezing. Airway intact bilateral equal chest rise and fall.

## 2016-10-18 NOTE — Discharge Instructions (Signed)
Please consider using a sinus rinse.  At this point I think your cough is most likely coming from sinus drainage, not from your chest.  Your lungs sounded normal on exam.   Please use tylenol and/or ibuprofen (or aleve etc) for discomfort if you can take them. Please consider over the counter cough drops and salt water gargles for your throat.  If you have any concerns or new/worsening symptoms please seek additional medical evaluation.

## 2016-10-19 ENCOUNTER — Other Ambulatory Visit: Payer: Medicaid Other

## 2016-10-19 ENCOUNTER — Inpatient Hospital Stay
Admission: RE | Admit: 2016-10-19 | Discharge: 2016-10-19 | Disposition: A | Discharge status: Home / Self Care | Payer: Medicaid Other | Source: Ambulatory Visit | Attending: Surgery | Admitting: Surgery

## 2016-10-19 NOTE — Telephone Encounter (Signed)
Pt called back is aware of appt for today but we discussed him taking ASA and he took one last night, I advised him to call [facility] imaging to reschedule, number was given and stated will call today to schedule

## 2016-10-19 NOTE — Telephone Encounter (Signed)
Called and left message on both home and mobile # to return call for appt informatijon

## 2016-10-22 ENCOUNTER — Ambulatory Visit
Admission: RE | Admit: 2016-10-22 | Discharge: 2016-10-22 | Disposition: A | Discharge status: Home / Self Care | Payer: Medicaid Other | Source: Ambulatory Visit | Attending: Surgery | Admitting: Surgery

## 2016-10-22 DIAGNOSIS — M25511 Pain in right shoulder: Secondary | ICD-10-CM

## 2016-10-22 MED ORDER — IOPAMIDOL (ISOVUE-M 200) INJECTION 41%
17.0000 mL | Freq: Once | INTRAMUSCULAR | Status: AC
  Administered 2016-10-22: 17 mL via INTRA_ARTICULAR

## 2016-10-30 ENCOUNTER — Encounter (INDEPENDENT_AMBULATORY_CARE_PROVIDER_SITE_OTHER): Payer: Self-pay | Admitting: Orthopedic Surgery

## 2016-10-30 ENCOUNTER — Ambulatory Visit (INDEPENDENT_AMBULATORY_CARE_PROVIDER_SITE_OTHER): Payer: Medicaid Other | Admitting: Orthopedic Surgery

## 2016-10-30 DIAGNOSIS — M25511 Pain in right shoulder: Secondary | ICD-10-CM | POA: Diagnosis not present

## 2016-10-30 MED ORDER — TRAMADOL HCL 50 MG PO TABS: 50.0000 mg | ORAL_TABLET | Freq: Four times a day (QID) | ORAL | 0 refills | Status: DC | PRN
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
## 2016-12-07 ENCOUNTER — Telehealth (INDEPENDENT_AMBULATORY_CARE_PROVIDER_SITE_OTHER): Payer: Self-pay | Admitting: Orthopedic Surgery

## 2016-12-07 DIAGNOSIS — M25511 Pain in right shoulder: Secondary | ICD-10-CM

## 2016-12-07 NOTE — Telephone Encounter (Signed)
Okay for physical therapy at [facility] physical therapy or other place for affected shoulder range of motion and strengthening 1-2 times a week for 3 weeks please call thanks

## 2016-12-07 NOTE — Telephone Encounter (Signed)
Patient called asked if he can be referred for (PT)  The number to contact patient is [phone]

## 2016-12-07 NOTE — Telephone Encounter (Signed)
[place] for [employer]. For shoulder? What do you want ordered and where?

## 2016-12-08 ENCOUNTER — Encounter: Payer: Self-pay | Admitting: Family Medicine

## 2016-12-08 ENCOUNTER — Ambulatory Visit (INDEPENDENT_AMBULATORY_CARE_PROVIDER_SITE_OTHER): Payer: Medicaid Other | Admitting: Family Medicine

## 2016-12-08 VITALS — BP 130/90 | HR 59 | Temp 98.6°F | Resp 14 | Ht 68.0 in | Wt 171.0 lb

## 2016-12-08 DIAGNOSIS — E1159 Type 2 diabetes mellitus with other circulatory complications: Secondary | ICD-10-CM

## 2016-12-08 DIAGNOSIS — M545 Low back pain: Secondary | ICD-10-CM

## 2016-12-08 DIAGNOSIS — I2510 Atherosclerotic heart disease of native coronary artery without angina pectoris: Secondary | ICD-10-CM | POA: Diagnosis not present

## 2016-12-08 DIAGNOSIS — M25561 Pain in right knee: Secondary | ICD-10-CM | POA: Diagnosis not present

## 2016-12-08 DIAGNOSIS — M25562 Pain in left knee: Secondary | ICD-10-CM | POA: Diagnosis not present

## 2016-12-08 DIAGNOSIS — R7989 Other specified abnormal findings of blood chemistry: Secondary | ICD-10-CM | POA: Diagnosis not present

## 2016-12-08 DIAGNOSIS — G8929 Other chronic pain: Secondary | ICD-10-CM | POA: Diagnosis not present

## 2016-12-08 DIAGNOSIS — E785 Hyperlipidemia, unspecified: Secondary | ICD-10-CM

## 2016-12-08 DIAGNOSIS — I10 Essential (primary) hypertension: Secondary | ICD-10-CM

## 2016-12-08 LAB — LIPID PANEL
Cholesterol: 195 mg/dL (ref <200)
HDL: 46 mg/dL (ref >40)
LDL Cholesterol: 132 mg/dL — ABNORMAL HIGH (ref <100)
TRIGLYCERIDES: 87 mg/dL (ref <150)
Total CHOL/HDL Ratio: 4.2 Ratio (ref <5.0)
VLDL: 17 mg/dL (ref <30)

## 2016-12-08 LAB — CBC WITH DIFFERENTIAL/PLATELET
BASOS ABS: 33 {cells}/uL (ref 0–200)
Basophils Relative: 1 %
EOS ABS: 165 {cells}/uL (ref 15–500)
Eosinophils Relative: 5 %
HCT: 43.3 % (ref 38.5–50.0)
Hemoglobin: 14.6 g/dL (ref 13.2–17.1)
LYMPHS PCT: 49 %
Lymphs Abs: 1617 cells/uL (ref 850–3900)
MCH: 29.2 pg (ref 27.0–33.0)
MCHC: 33.7 g/dL (ref 32.0–36.0)
MCV: 86.6 fL (ref 80.0–100.0)
MONOS PCT: 8 %
MPV: 9.2 fL (ref 7.5–12.5)
Monocytes Absolute: 264 cells/uL (ref 200–950)
NEUTROS PCT: 37 %
Neutro Abs: 1221 cells/uL — ABNORMAL LOW (ref 1500–7800)
PLATELETS: 222 10*3/uL (ref 140–400)
RBC: 5 MIL/uL (ref 4.20–5.80)
RDW: 13.6 % (ref 11.0–15.0)
WBC: 3.3 10*3/uL — ABNORMAL LOW (ref 3.8–10.8)

## 2016-12-08 LAB — COMPLETE METABOLIC PANEL WITH GFR
ALK PHOS: 53 U/L (ref 40–115)
ALT: 24 U/L (ref 9–46)
AST: 19 U/L (ref 10–35)
Albumin: 4.2 g/dL (ref 3.6–5.1)
BILIRUBIN TOTAL: 0.7 mg/dL (ref 0.2–1.2)
BUN: 9 mg/dL (ref 7–25)
CO2: 23 mmol/L (ref 20–31)
Calcium: 9.6 mg/dL (ref 8.6–10.3)
Chloride: 103 mmol/L (ref 98–110)
Creat: 0.87 mg/dL (ref 0.70–1.33)
Glucose, Bld: 91 mg/dL (ref 65–99)
POTASSIUM: 4.5 mmol/L (ref 3.5–5.3)
Sodium: 136 mmol/L (ref 135–146)
Total Protein: 6.7 g/dL (ref 6.1–8.1)

## 2016-12-08 LAB — POCT URINALYSIS DIP (DEVICE)
Bilirubin Urine: NEGATIVE
Glucose, UA: NEGATIVE mg/dL
Hgb urine dipstick: NEGATIVE
Ketones, ur: NEGATIVE mg/dL
Leukocytes, UA: NEGATIVE
NITRITE: NEGATIVE
PH: 7 (ref 5.0–8.0)
Protein, ur: NEGATIVE mg/dL
SPECIFIC GRAVITY, URINE: 1.01 (ref 1.005–1.030)
UROBILINOGEN UA: 0.2 mg/dL (ref 0.0–1.0)

## 2016-12-08 MED ORDER — METFORMIN HCL ER 500 MG PO TB24: 500.0000 mg | ORAL_TABLET | Freq: Every day | ORAL | Status: DC

## 2016-12-08 NOTE — Patient Instructions (Addendum)
Your A1C 5.4, which is excellent! I am reducing your metformin from 1,000 to 500 mg daily with breakfast.  Continue all other medications as prescribed. I am referring you to pain management for back pain and placing a new orthopedics referral for your right knee.   You will be notified via MyChart regarding you lab results.  Nice meeting you !   Return in 6 months or sooner if needed.    Exercising to Stay Healthy Exercising regularly is important. It has many health benefits, such as:  Improving your overall fitness, flexibility, and endurance.  Increasing your bone density.  Helping with weight control.  Decreasing your body fat.  Increasing your muscle strength.  Reducing stress and tension.  Improving your overall health.  In order to become healthy and stay healthy, it is recommended that you do moderate-intensity and vigorous-intensity exercise. You can tell that you are exercising at a moderate intensity if you have a higher heart rate and faster breathing, but you are still able to hold a conversation. You can tell that you are exercising at a vigorous intensity if you are breathing much harder and faster and cannot hold a conversation while exercising. How often should I exercise? Choose an activity that you enjoy and set realistic goals. Your health care provider can help you to make an activity plan that works for you. Exercise regularly as directed by your health care provider. This may include:  Doing resistance training twice each week, such as: ? Push-ups. ? Sit-ups. ? Lifting weights. ? Using resistance bands.  Doing a given intensity of exercise for a given amount of time. Choose from these options: ? 150 minutes of moderate-intensity exercise every week. ? 75 minutes of vigorous-intensity exercise every week. ? A mix of moderate-intensity and vigorous-intensity exercise every week.  Children, pregnant women, people who are out of shape, people who are  overweight, and older adults may need to consult a health care provider for individual recommendations. If you have any sort of medical condition, be sure to consult your health care provider before starting a new exercise program. What are some exercise ideas? Some moderate-intensity exercise ideas include:  Walking at a rate of 1 mile in 15 minutes.  Biking.  Hiking.  Golfing.  Dancing.  Some vigorous-intensity exercise ideas include:  Walking at a rate of at least 4.5 miles per hour.  Jogging or running at a rate of 5 miles per hour.  Biking at a rate of at least 10 miles per hour.  Lap swimming.  Roller-skating or in-line skating.  Cross-country skiing.  Vigorous competitive sports, such as football, basketball, and soccer.  Jumping rope.  Aerobic dancing.  What are some everyday activities that can help me to get exercise?  [place] work, such as: ? Pushing a Conservation officer, nature. ? Raking and bagging leaves.  Washing and waxing your car.  Pushing a stroller.  Shoveling snow.  Gardening.  Washing windows or floors. How can I be more active in my day-to-day activities?  Use the stairs instead of the elevator.  Take a walk during your lunch break.  If you drive, park your car farther away from work or school.  If you take public transportation, get off one stop early and walk the rest of the way.  Make all of your phone calls while standing up and walking around.  Get up, stretch, and walk around every 30 minutes throughout the day. What guidelines should I follow while exercising?  Do not  exercise so much that you hurt yourself, feel dizzy, or get very short of breath.  Consult your health care provider before starting a new exercise program.  Wear comfortable clothes and shoes with good support.  Drink plenty of water while you exercise to prevent dehydration or heat stroke. Body water is lost during exercise and must be replaced.  Work out until you  breathe faster and your heart beats faster. This information is not intended to replace advice given to you by your health care provider. Make sure you discuss any questions you have with your health care provider. Document Released: 07/18/2010 Document Revised: 11/21/2015 Document Reviewed: 11/16/2013 Elsevier Interactive Patient Education  [employer].

## 2016-12-08 NOTE — Telephone Encounter (Signed)
Referral is in chart

## 2016-12-08 NOTE — Telephone Encounter (Signed)
We can make referral for him but since he has Medicaid insurance likely that they only cover 2-3 visits per year and that is typically only if they have had a fracture or are following up for post op. I called patient left detailed VM for him advising of this and asked him to call us if he had different insurance other than medicaid. [facility] and he should be contacted. If no visits are covered they could set him up with HEP

## 2016-12-08 NOTE — Progress Notes (Signed)
Patient ID: [name], male    DOB: [birth year], 56 y.o.   MRN: [id number]  PCP: Scot Jun, FNP  Chief Complaint  Patient presents with  . Establish Care  . Knee Pain    Right for several years    Subjective:  HPI [name] is a 56 y.o. male presents to establish care and evaluation of chronic back and  right knee pain.  Medical problems include: Hypertension, CAD, diabetes type 2, unstable angina, erectile dysfunction related to low testosterone level, and hyperlipidemia.  Hypertension/CAD Hx of angioplasty related to unstable angina 08/2014. He has hypertension and reports no routine home monitoring of  blood pressures at home. He is currently a nonsmoker, although has a history of former smoker. He denies any associated headaches, dizziness, and recent chest pain. He has cardiology services on board with Dr. Deeann Dowse. Last office visit was 01/13/2016. He was told to follow up with cardiology annually.  He reports adherence to his current medication regimen, lisinopril 5 mg daily.  Knee pain  Reports worsening bilateral knee pain. This problem has been present for several years however recently it has been worsened. Patient reports there is significant pain with his regular exercise routine consisting throbbing pain with squats and jogging. Pain is most pronounced in his bilateral patellar. Patient has a significant history for degenerative joint disease in the back. Unknown in the degenerative joint disease in his knees. He has tried and failed over-the-counter treatment regimens. He is requesting a referral for further evaluation by orthopedics.  Chronic back pain [name] reports previously being evaluated on several occasions regarding his chronic low back pain. He reports to source of the pain is degenerative joint disease. He opted not to pursue surgical intervention. He chooses to manage on chronic pain medication. He has been most recently prescribed  Tylenol No. 4 and which he takes 1 tablet every 8 hours as needed for chronic back pain.  He will need a referral to pain management today.  Low T  [name] reports a previous diagnosis of low testosterone resulting in erectile dysfunction.He has been previously treated with testosterone injections however has not received an injection for the last couple months as he developed enlarged breast tissue while receiving the injections. He reports associated symptoms of feeling tired and fatigued even with rest and having a low libido. He reports finding it difficult to achieve an erection and maintain an erection.  Social History   Social History  . Marital status: Divorced    Spouse name: N/A  . Number of children: 2  . Years of education: GED   Occupational History  . Not on file.   Social History Main Topics  . Smoking status: Former Smoker    Packs/day: 1.00    Years: 10.00    Types: Cigarettes    Quit date: 04/07/2009  . Smokeless tobacco: Never Used  . Alcohol use No     Comment: 12/15/11 "til 2010, drank a pint of gin/day at least"  . Drug use: No     Comment: 12/15/11 "last cocaine was 2010"  . Sexual activity: Not Currently   Other Topics Concern  . Not on file   Social History Narrative  . No narrative on file    Family History  Problem Relation Age of Onset  . Leukemia Mother   . Prostate cancer Father   . Coronary artery disease Paternal Grandmother   . Cancer Paternal Grandfather   . Cancer  Brother    Review of Systems See HPI Patient Active Problem List   Diagnosis Date Noted  . Essential hypertension 01/13/2016  . Coronary artery disease involving native coronary artery of native heart without angina pectoris 01/13/2016  . Unstable angina ([place])   . ED (erectile dysfunction) 02/15/2014  . Allergy to contrast media (used for diagnostic x-rays) 05/12/2011  . Hypertension 05/09/2011  . Hyperlipidemia 05/09/2011  . DM2 (diabetes mellitus, type 2) ([place])  07/10/2010  . CAD S/P percutaneous coronary angioplasty 07/10/2010  . DYSPEPSIA&OTHER [facility] DISORDERS FUNCTION STOMACH 07/10/2010    Allergies  Allergen Reactions  . Bee Venom Anaphylaxis and Hives  . Shellfish Allergy Anaphylaxis and Hives  . Statins Other (See Comments)    Myalgias. Tolerating livalo.     Prior to Admission medications   Medication Sig Start Date End Date Taking? Authorizing Provider  acetaminophen-codeine (TYLENOL #4) 300-60 MG tablet Take 1 tablet by mouth every 8 (eight) hours as needed for moderate pain. 10/13/16  Yes Lanae Crumbly, PA-C  aspirin EC 325 MG tablet Take 1 tablet (325 mg total) by mouth daily. Patient taking differently: Take 325 mg by mouth at bedtime.  06/03/16  Yes Sherwood Gambler, MD  carvedilol (COREG) 12.5 MG tablet Take 1 tablet (12.5 mg total) by mouth 2 (two) times daily with a meal. Patient taking differently: Take 25 mg by mouth daily.  02/06/15  Yes Hilty, Nadean Corwin, MD  Coenzyme Q10 (CO Q 10 PO) Take 1 capsule by mouth at bedtime.   Yes [provider]  lisinopril (PRINIVIL,ZESTRIL) 5 MG tablet Take 5 mg by mouth daily.   Yes [provider]  metFORMIN (GLUMETZA) 1000 MG (MOD) 24 hr tablet Take 1 tablet (1,000 mg total) by mouth daily with breakfast. 06/12/15  Yes Eileen Stanford, PA-C  nitroGLYCERIN (NITROSTAT) 0.4 MG SL tablet Place 1 tablet (0.4 mg total) under the tongue every 5 (five) minutes as needed. For chest pain 08/28/13  Yes Hilty, Nadean Corwin, MD  Omega-3 Fatty Acids (FISH OIL) 1200 MG CAPS Take 1 capsule (1,200 mg total) by mouth daily. 07/11/15  Yes Kerin Ransom K, PA-C  sildenafil (REVATIO) 20 MG tablet Take 2 to 3 tablets as needed for sexual activity 07/11/15  Yes Kilroy, Doreene Burke, PA-C  Testosterone Cypionate 200 MG/ML KIT Inject 0.8 mLs into the muscle once a week.   Yes [provider]  traMADol (ULTRAM) 50 MG tablet Take 1 tablet (50 mg total) by mouth every 6 (six) hours as needed. 10/30/16  Yes  Meredith Pel, MD  cyclobenzaprine (FLEXERIL) 10 MG tablet Take 1 tablet (10 mg total) by mouth 3 (three) times daily as needed for muscle spasms. Patient not taking: Reported on 12/08/2016 07/11/15   Erlene Quan, PA-C  gabapentin (NEURONTIN) 300 MG capsule Take 2 capsules (600 mg total) by mouth at bedtime. Patient not taking: Reported on 12/08/2016 10/02/14   [name], MD  LIVALO 4 MG TABS TAKE ONE TABLET BY MOUTH ONCE DAILY 12/04/14   Hilty, Nadean Corwin, MD  naproxen (NAPROSYN) 375 MG tablet Take 1 tablet (375 mg total) by mouth 2 (two) times daily. Patient not taking: Reported on 12/08/2016 10/11/16   Malvin Johns, MD    Past Medical, Surgical Family and Social History reviewed and updated.    Objective:   Today's Vitals   12/08/16 0808  BP: 130/90  Pulse: (!) 59  Resp: 14  Temp: 98.6 F (37 C)  TempSrc: Oral  SpO2: 100%  Weight: 171 lb (77.6 kg)  Height: _0  (1.727 m)    Wt Readings from Last 3 Encounters:  12/08/16 171 lb (77.6 kg)  10/18/16 175 lb (79.4 kg)  10/13/16 175 lb (79.4 kg)   Physical Exam  Constitutional: He is oriented to person, place, and time. He appears well-developed and well-nourished.  HENT:  Head: Normocephalic and atraumatic.  Eyes: Conjunctivae and EOM are normal. Pupils are equal, round, and reactive to light.  Neck: Normal range of motion. Neck supple. No thyromegaly present.  Cardiovascular: Normal rate, regular rhythm, normal heart sounds and intact distal pulses.   Pulmonary/Chest: Effort normal and breath sounds normal.  Abdominal: Soft. Bowel sounds are normal.  Musculoskeletal: He exhibits tenderness.  Bilateral knee tenderness increased w/extension and flexion. Negative visible signs of effusion. Negative swelling or bruising   Lymphadenopathy:    He has no cervical adenopathy.  Neurological: He is alert and oriented to person, place, and time.  Skin: Skin is warm and dry.  Psychiatric: He has a normal mood and affect. His  behavior is normal. Judgment and thought content normal.   Assessment & Plan:  1. Essential hypertension, stable  - COMPLETE METABOLIC PANEL WITH GFR -Continue Lisinopril   2. Coronary artery disease involving native coronary artery of native heart without angina pectoris -scheduled 12 month follow-up with cardiology for July. -ontinue daily aspirin  3. Type 2 diabetes mellitus with other circulatory complication, without long-term current use of insulin ([place]), well-controlled today. - HM Diabetes Foot Exam - POCT glycosylated hemoglobin (Hb A1C), 5.4 -I'm reducing her metformin from 1000 mg per day to 500 mg daily with meal.  4. Hyperlipidemia, unspecified hyperlipidemia type - Lipid panel - Thyroid Panel With TSH -Continue Fish oil and Livalo   5. Low testosterone - PSA - Testosterone Total,Free,Bio, Males - CBC with Differential -[name] was previously prescribed sildenafil however with his history of unstable angina any future prescriptions for sildenafil would have to be approved through cardiology. -Will consider AndroGel as long as lab is stable. We will defer from placing him back on the testosterone injections due to his reaction of Pap and increased breast tissue.  6. Chronic low back pain without sciatica, unspecified back pain laterality - Ambulatory referral to Pain Clinic  7. Chronic pain of both knees - Ambulatory referral to Orthopedic Surgery  RTC: 6 months for Diabetes and Hypertension follow-up.  Kenneth Sage. Kenton Kingfisher, MSN, FNP-C [facility]  [address] [place],  [address] [phone]

## 2016-12-09 LAB — THYROID PANEL WITH TSH
Free Thyroxine Index: 3.3 (ref 1.4–3.8)
T3 Uptake: 35 % (ref 22–35)
T4, Total: 9.4 ug/dL (ref 4.5–12.0)
TSH: 1.23 m[IU]/L (ref 0.40–4.50)

## 2016-12-09 LAB — TESTOSTERONE TOTAL,FREE,BIO, MALES
ALBUMIN: 4.2 g/dL (ref 3.6–5.1)
SEX HORMONE BINDING: 33 nmol/L (ref 10–50)
TESTOSTERONE: 236 ng/dL — AB (ref 250–827)

## 2016-12-09 LAB — PSA: PSA: 0.8 ng/mL (ref <=4.0)

## 2016-12-11 ENCOUNTER — Encounter: Payer: Self-pay | Admitting: Family Medicine

## 2016-12-11 LAB — POCT GLYCOSYLATED HEMOGLOBIN (HGB A1C): HEMOGLOBIN A1C: 5.4

## 2016-12-20 ENCOUNTER — Other Ambulatory Visit: Payer: Self-pay | Admitting: Family Medicine

## 2016-12-20 MED ORDER — TESTOSTERONE 50 MG/5GM (1%) TD GEL: 5.0000 g | Freq: Every day | TRANSDERMAL | 0 refills | Status: DC

## 2016-12-20 NOTE — Progress Notes (Signed)
Morey [facility], Fax prescription for testosterone to pharmacy on file.

## 2016-12-21 NOTE — Progress Notes (Signed)
Left a vm for patient to callback and let me know which pharmacy he wants medication sent to

## 2016-12-21 NOTE — Progress Notes (Signed)
Medication sent to [facility].  

## 2016-12-28 ENCOUNTER — Other Ambulatory Visit: Payer: Self-pay | Admitting: Family Medicine

## 2016-12-28 MED ORDER — METFORMIN HCL ER 500 MG PO TB24: 500.0000 mg | ORAL_TABLET | Freq: Every day | ORAL | Status: DC

## 2017-01-04 ENCOUNTER — Ambulatory Visit (INDEPENDENT_AMBULATORY_CARE_PROVIDER_SITE_OTHER): Payer: Medicaid Other | Admitting: Internal Medicine

## 2017-01-04 ENCOUNTER — Encounter: Payer: Self-pay | Admitting: Internal Medicine

## 2017-01-04 VITALS — BP 114/82 | HR 50 | Ht 68.0 in | Wt 168.0 lb

## 2017-01-04 DIAGNOSIS — I2510 Atherosclerotic heart disease of native coronary artery without angina pectoris: Secondary | ICD-10-CM

## 2017-01-04 DIAGNOSIS — E785 Hyperlipidemia, unspecified: Secondary | ICD-10-CM

## 2017-01-04 DIAGNOSIS — E1140 Type 2 diabetes mellitus with diabetic neuropathy, unspecified: Secondary | ICD-10-CM

## 2017-01-04 DIAGNOSIS — I10 Essential (primary) hypertension: Secondary | ICD-10-CM

## 2017-01-04 MED ORDER — PITAVASTATIN CALCIUM 4 MG PO TABS: 1.0000 | ORAL_TABLET | Freq: Every day | ORAL | 3 refills | Status: DC

## 2017-01-04 MED ORDER — L-METHYLFOLATE-B6-B12 3-35-2 MG PO TABS: 1.0000 | ORAL_TABLET | Freq: Every day | ORAL | 3 refills | Status: DC

## 2017-01-04 NOTE — Patient Instructions (Addendum)
Your physician has recommended you make the following change in your medication: -- TAKE aspirin 81mg  once daily -- START metanx daily  Your physician recommends that you return for lab work in: [place] (fasting)  Your physician wants you to follow-up in: ONE YEAR with Dr. Debara Pickett. You will receive a reminder letter in the mail two months in advance. If you don't receive a letter, please call our office to schedule the follow-up appointment.

## 2017-01-04 NOTE — Progress Notes (Signed)
OFFICE NOTE  Chief Complaint:  "I feel well"  Primary Care Physician: Scot Jun, FNP  HPI:  [name] a 56 year old gentleman who has a history of coronary disease, hypertension, dyslipidemia, diabetes type 2, and microvascular angina. He has had multiple PCIs, in-stent restenosis, but has been actually fairly stable since his last drug-eluting stent placement in June 2013. He had a nuclear stress test which showed some mild abnormalities and was considered low risk and has been treated medically.  He had chest pain in January 2014 and was seen at [facility]; however, no intervention was performed at that time. Recently he had some dyspepsia treated by a PPI which has resolved. He has had a little bit of weight gain and decreased exercise, which he is working on, and some dietary indiscretion. He also had a metabolic test in our office just on August 08, 2012, which showed good exercise effort, peak VO2 of 79% predicted with normal VO2 heart-rate curves indicating only mild deconditioning but no evidence of ischemia.   Since his last office visit he is adopted a vegetarian lifestyle and has managed to lose almost 20 pounds. He continues to exercise and his made great strides as far as his symptoms are concerned. He still has occasional episodes of angina which are nitrate responsive. He is concerned however about taking higher doses of Imdur as he read that it may not be effective in the long term.  He is inquiring about possibly coming off the medication if possible. He is no longer taking little low as he has made dietary changes and wishes to use a natural approach rather than statin medication.  I've reminded him that because he has significant coronary disease and multiple stents that statins are still indicated despite dietary changes.  [name] is doing well today. Denies any chest pain. He is interested in seeing what his cholesterol is coming down to now that he's been on a  vegan and diet. He is not on any cholesterol medications. He also reports some sexual dysfunction and is interested in Viagra.  01/13/2016  [name] returns today for follow-up. He is doing exceedingly well. He continues to exercise. He had switched to a vegan diet but recently had to introduce some dairy. His weight is down another 10 pounds and again he has no anginal symptoms at all. He is interested actually in coming off of Plavix today. It's been more than a year since his stent and since he's been free of angina I think that's very reasonable. Recent cholesterol testing shows marked improvement in his numbers including an LDL of 66 and total cholesterol of 132.  01/04/2017  [name] was seen today in follow-up. He continues to do well. He denies any new chest pain or worsening shortness of breath. He continues exercise regularly. Weight is down further from 175-168 and he is essentially at goal. Blood pressure is well-controlled today 114/82. His EKG is personally reviewed shows sinus bradycardia 50 with no ischemic changes. He recently had lab work showed total cholesterol 195, Trilisate 87, HDL 46 and LDL-C at 132. Of note his LDL-C was 66 last year. When queried about this he reported his insurance, he said that he did not need the medicine anymore and would not cover it. Clearly although he is optimize regarding diet and exercise his cholesterol remains elevated. His goal LDL-C is less than 70 given his coronary artery disease and he will not recheck goal without medication. Unfortunately he  was intolerant to a number of statins in the past including atorvastatin, simvastatin and rosuvastatin. He's also recently been complaining of some peripheral neuropathy. He is not on medication yet for this but reports numbness and tingling in his hands and feet. He would live and A1c however is now better controlled at 5.4.  PMHx:  Past Medical History:  Diagnosis Date  . Arthritis   . Chronic lower back  pain   . Coronary artery disease    a. Multiple prior caths/PCI. Cath 2013 with possible spasm of RCA, 70% ISR of mid LCx with subsequent DES to mLCx and prox LCX. b. H/o microvascular angina. c. Recurrent angina 08/2014 - s/p PTCA/DES to prox Cx, PTCA/CBA to OM1.  c. LHC 06/10/15 with patent stents and some ISR in LCX and OM-1 that was not flow limiting --> Rx   . Dyslipidemia    a. Intolerant to many statins except tolerating Livalo.  Marland Kitchen GERD (gastroesophageal reflux disease)   . Hypertension   . Myocardial infarction ([place]) ~ 2010  . S/P angioplasty with stent, DES, to proximal and mid LCX 12/15/11 12/15/2011  . Type II diabetes mellitus ([place])     Past Surgical History:  Procedure Laterality Date  . CARDIAC CATHETERIZATION  06/15/2002   LAD with prox 40% stenosis, norma L main, Cfx with 25% lesion, RCA with long mid 25% stenosis (Dr. Vita Barley)  . CARDIAC CATHETERIZATION  04/01/2010   normal L main, LAD wit mild stenosis, L Cfx with 70% in-stent restenosis, RCA with 70% in-stent restenosis, LVEF >60% (Dr. K. Mali Kobey Sides) - cutting ballon arthrectomy to RCA & Cfx (Dr. Rockne Menghini)  . CARDIAC CATHETERIZATION  08/25/2010   preserved global LV contractility; multivessel CAD, diffuse 90-95% in-stent restenosis in prox placed Cfx stent - cutting balloon arthrectomy in Cfx with multiple dilatations 90-95% to 0% stenosis (Dr. Corky Downs)  . CARDIAC CATHETERIZATION  01/26/2011   PCI & stenting of aggresive in-stent restenosis within previously stented AV groove Cfx with 3.0x58m Taxus DES (previous stents were Promus) (Dr. JAdora Fridge  . CARDIAC CATHETERIZATION  05/11/2011   preserved LV function, 40% mid LAD stenosis, 30-40% narrowing proximal to stented semgnet of prox Cfx, patent mid RCA stent with smooth 20% narrowing in distal RCA (Dr. TCorky Downs  . CARDIAC CATHETERIZATION  12/15/2011   PCI & stenting of proximal & mid Cfx with DES - 3.0x110min proximal, 3.0x1564mn mid (Dr. J. Adora Fridge. CARDIAC  CATHETERIZATION N/A 06/10/2015   Procedure: Left Heart Cath and Coronary Angiography;  Surgeon: DanJolaine ArtistD;  Location: [facility];  Service: Cardiovascular;  Laterality: N/A;  . cardiometabolic testing  08/11/30/9518good exercise effort, peak VO2 79% predicted with normal VO2 HR curves (mild deconditioning)  . EXCISIONAL HEMORRHOIDECTOMY  1984  . LEFT HEART CATHETERIZATION WITH CORONARY ANGIOGRAM N/A 05/11/2011   Procedure: LEFT HEART CATHETERIZATION WITH CORONARY ANGIOGRAM;  Surgeon: ThoTroy SineD;  [facility];  Service: Cardiovascular;  Laterality: N/A;  Possible percutaneous coronary intervention, possible IVUS  . LEFT HEART CATHETERIZATION WITH CORONARY ANGIOGRAM N/A 12/15/2011   Procedure: LEFT HEART CATHETERIZATION WITH CORONARY ANGIOGRAM;  Surgeon: JonLorretta HarpD;  [facility];  Service: Cardiovascular;  Laterality: N/A;  . LEFT HEART CATHETERIZATION WITH CORONARY ANGIOGRAM N/A 09/05/2014   Procedure: LEFT HEART CATHETERIZATION WITH CORONARY ANGIOGRAM;  Surgeon: ChrBurnell BlanksD;  [facility];  Service: Cardiovascular;  Laterality: N/A;  . LIPOMA EXCISION  back of the head  . NM MYOCAR PERF WALL MOTION  02/2012   lexiscan myoview; mild perfusion defect in mid inferolateral & basal inferolateral region (infarct/scar); EF 52%, abnormal but ow risk scan  . PERCUTANEOUS CORONARY STENT INTERVENTION (PCI-S)  09/05/2014   Procedure: PERCUTANEOUS CORONARY STENT INTERVENTION (PCI-S);  Surgeon: Burnell Blanks, MD;  Location: [facility] CATH LAB;  Service: Cardiovascular;;    FAMHx:  Family History  Problem Relation Age of Onset  . Leukemia Mother   . Prostate cancer Father   . Coronary artery disease Paternal Grandmother   . Cancer Paternal Grandfather   . Cancer Brother     SOCHx:   reports that he quit smoking about 7 years ago. His smoking use included Cigarettes. He has a 10.00 pack-year smoking history. He has never  used smokeless tobacco. He reports that he does not drink alcohol or use drugs.  ALLERGIES:  Allergies  Allergen Reactions  . Bee Venom Anaphylaxis and Hives  . Shellfish Allergy Anaphylaxis and Hives  . Statins Other (See Comments)    Myalgias. Tolerating livalo.   . Testosterone Cypionate     Testerone Injection --Increased breast tissue     ROS: Pertinent items noted in HPI and remainder of comprehensive ROS otherwise negative.  HOME MEDS: Current Outpatient Prescriptions  Medication Sig Dispense Refill  . acetaminophen-codeine (TYLENOL #4) 300-60 MG tablet Take 1 tablet by mouth every 8 (eight) hours as needed for moderate pain. 50 tablet 0  . aspirin EC 325 MG tablet Take 1 tablet (325 mg total) by mouth daily. (Patient taking differently: Take 325 mg by mouth at bedtime. ) 30 tablet 0  . carvedilol (COREG) 12.5 MG tablet Take 1 tablet (12.5 mg total) by mouth 2 (two) times daily with a meal. (Patient taking differently: Take 25 mg by mouth daily. ) 180 tablet 1  . Coenzyme Q10 (CO Q 10 PO) Take 1 capsule by mouth at bedtime.    . cyclobenzaprine (FLEXERIL) 10 MG tablet Take 1 tablet (10 mg total) by mouth 3 (three) times daily as needed for muscle spasms. 30 tablet 0  . gabapentin (NEURONTIN) 300 MG capsule Take 2 capsules (600 mg total) by mouth at bedtime. 180 capsule 3  . lisinopril (PRINIVIL,ZESTRIL) 5 MG tablet Take 5 mg by mouth daily.    Marland Kitchen LIVALO 4 MG TABS TAKE ONE TABLET BY MOUTH ONCE DAILY 90 tablet 1  . metFORMIN (GLUCOPHAGE-XR) 500 MG 24 hr tablet Take 1 tablet (500 mg total) by mouth daily with breakfast.    . naproxen (NAPROSYN) 375 MG tablet Take 1 tablet (375 mg total) by mouth 2 (two) times daily. 20 tablet 0  . nitroGLYCERIN (NITROSTAT) 0.4 MG SL tablet Place 1 tablet (0.4 mg total) under the tongue every 5 (five) minutes as needed. For chest pain 25 tablet 3  . Omega-3 Fatty Acids (FISH OIL) 1200 MG CAPS Take 1 capsule (1,200 mg total) by mouth daily. 90 capsule  3  . sildenafil (REVATIO) 20 MG tablet Take 2 to 3 tablets as needed for sexual activity 100 tablet 0  . testosterone (ANDROGEL) 50 MG/5GM (1%) GEL Place 5 g onto the skin daily. 300 g 0  . Testosterone Cypionate 200 MG/ML KIT Inject 0.8 mLs into the muscle once a week.    . traMADol (ULTRAM) 50 MG tablet Take 1 tablet (50 mg total) by mouth every 6 (six) hours as needed. 30 tablet 0   No current facility-administered medications for this visit.  LABS/IMAGING: No results found for this or any previous visit (from the past 48 hour(s)). No results found.  VITALS: BP 114/82   Pulse (!) 50   Ht _0  (1.727 m)   Wt 168 lb (76.2 kg)   BMI 25.54 kg/m   EXAM: General appearance: alert and no distress Neck: no carotid bruit and no JVD Lungs: clear to auscultation bilaterally Heart: regular rate and rhythm, S1, S2 normal, no murmur, click, rub or gallop Abdomen: soft, non-tender; bowel sounds normal; no masses,  no organomegaly Extremities: extremities normal, atraumatic, no cyanosis or edema Pulses: 2+ and symmetric Skin: Skin color, texture, turgor normal. No rashes or lesions Neurologic: Grossly normal Psych: Pleasant  EKG: Sinus bradycardia at 50  ASSESSMENT: 1. Coronary artery disease status post multiple PCI as with the last stent to the circumflex in 2013 2. Dyslipidemia-on Livalo (intolerant to atorvastatin, simvastatin and rosuvastatin) 3. Hypertension 4. 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 Erectile dysfunction  PLAN: 1.   [name] is doing exceedingly well from a cardiovascular standpoint. He denies any further angina symptoms. We discussed decreasing his aspirin from 325 mg daily to 81 mg daily today. This is associated with similar efficacy unless bleeding risk. He has marked dyslipidemia with a goal LDL-C less than 70 and is not at goal because of coming off of his Livalo. He says this was due to insurance company not paying for the medication. He has  had prior intolerances atorvastatin, simvastatin and rosuvastatin and I will appeal this to his insurance company. Blood pressure is at goal today. His type 2 diabetes is well controlled with A1c 5.4 although does have some peripheral neuropathy. He may benefit from supplementation with Metanx which I provided today. Plan follow-up annually or sooner as necessary.  Pixie Casino, MD, [facility] Attending Cardiologist [place] 01/04/2017, 8:22 AM

## 2017-01-07 ENCOUNTER — Telehealth: Payer: Self-pay

## 2017-01-07 MED ORDER — METFORMIN HCL ER 500 MG PO TB24: 500.0000 mg | ORAL_TABLET | Freq: Every day | ORAL | 1 refills | Status: DC

## 2017-01-12 ENCOUNTER — Telehealth: Payer: Self-pay

## 2017-01-12 MED ORDER — METFORMIN HCL ER 500 MG PO TB24: 500.0000 mg | ORAL_TABLET | Freq: Every day | ORAL | 1 refills | Status: DC

## 2017-01-12 NOTE — Telephone Encounter (Signed)
Medication sent to [facility].

## 2017-01-15 ENCOUNTER — Telehealth: Payer: Self-pay | Admitting: Internal Medicine

## 2017-01-15 DIAGNOSIS — E1140 Type 2 diabetes mellitus with diabetic neuropathy, unspecified: Secondary | ICD-10-CM

## 2017-01-15 DIAGNOSIS — E785 Hyperlipidemia, unspecified: Secondary | ICD-10-CM

## 2017-01-15 NOTE — Telephone Encounter (Signed)
°  New Prob   Pt would like to speak to a nurse regarding he recent medication requests. Please call.

## 2017-01-15 NOTE — Telephone Encounter (Signed)
°  New Prob  *STAT* If patient is at the pharmacy, call can be transferred to refill team.   1. Which medications need to be refilled? (please list name of each medication and dose if known) Livalo 4 mg once daily Asprin 81 mg once daily Metanx 3-35-2 mg once daily  2. Which pharmacy/location (including street and city if local pharmacy) is medication to be sent to? [facility] on [address]   3. Do they need a 30 day or 90 day supply? 90 days

## 2017-01-18 MED ORDER — PITAVASTATIN CALCIUM 4 MG PO TABS: 1.0000 | ORAL_TABLET | Freq: Every day | ORAL | 3 refills | Status: DC

## 2017-01-18 MED ORDER — ASPIRIN EC 81 MG PO TBEC: 81.0000 mg | DELAYED_RELEASE_TABLET | Freq: Every day | ORAL | 3 refills | Status: DC

## 2017-01-18 MED ORDER — L-METHYLFOLATE-B6-B12 3-35-2 MG PO TABS: 1.0000 | ORAL_TABLET | Freq: Every day | ORAL | 3 refills | Status: DC

## 2017-01-18 NOTE — Telephone Encounter (Signed)
Refill sent for 90 w/3rf to [facility] on [address]

## 2017-01-22 ENCOUNTER — Telehealth: Payer: Self-pay | Admitting: Internal Medicine

## 2017-01-22 NOTE — Telephone Encounter (Signed)
Faxed PA for livalo to [facility] @ [phone]

## 2017-01-25 ENCOUNTER — Ambulatory Visit (INDEPENDENT_AMBULATORY_CARE_PROVIDER_SITE_OTHER): Payer: Medicaid Other

## 2017-01-25 ENCOUNTER — Encounter (INDEPENDENT_AMBULATORY_CARE_PROVIDER_SITE_OTHER): Payer: Self-pay | Admitting: Orthopaedic Surgery

## 2017-01-25 ENCOUNTER — Ambulatory Visit (INDEPENDENT_AMBULATORY_CARE_PROVIDER_SITE_OTHER): Payer: Medicaid Other | Admitting: Orthopaedic Surgery

## 2017-01-25 VITALS — BP 114/75 | HR 70 | Resp 14 | Ht 68.0 in | Wt 175.0 lb

## 2017-01-25 DIAGNOSIS — M5440 Lumbago with sciatica, unspecified side: Secondary | ICD-10-CM | POA: Diagnosis not present

## 2017-01-25 DIAGNOSIS — G8929 Other chronic pain: Secondary | ICD-10-CM

## 2017-01-25 DIAGNOSIS — M25562 Pain in left knee: Secondary | ICD-10-CM

## 2017-01-25 DIAGNOSIS — M25561 Pain in right knee: Secondary | ICD-10-CM

## 2017-01-25 NOTE — Progress Notes (Signed)
Office Visit Note   Patient: [name]           Date of Birth: [birth year]           MRN: [id number] Visit Date: 01/25/2017              Requested by: Scot Jun, [address], [place] [address] PCP: Scot Jun, FNP   Assessment & Plan: Visit Diagnoses:  1. Low back pain with sciatica, sciatica laterality unspecified, unspecified back pain laterality, unspecified chronicity   2. Chronic pain of right knee   3. Left knee pain, unspecified chronicity   Bilateral chondromalacia patella. Generative facet arthritis L4-5 and L5-S1 without sciatica  Plan: Long discussion regarding x-ray findings and diagnoses. Instructed on quadriceps strengthening exercises and use of NSAIDs.   Follow-Up Instructions: Return if symptoms worsen or fail to improve.   Orders:  Orders Placed This Encounter  Procedures  . XR KNEE 3 VIEW RIGHT  . XR KNEE 3 VIEW LEFT  . XR Pelvis 1-2 Views  . XR Lumbar Spine 2-3 Views   No orders of the defined types were placed in this encounter.     Procedures: No procedures performed   Clinical Data: No additional findings.   Subjective: Chief Complaint  Patient presents with  . Left Knee - Pain    [name] is a 56 y o that presents with chronic BIL knee and back pain. Previous dx from PCP of DDD. He relates pain for many years. Pt is diabetic, numbness in feet  . Right Knee - Pain    Pt was a former runner and states his knees hurt too bad to continue running and also climbing up stairs  . Lower Back - Pain  [name] relates that he's had some chronic problems with his back but without radicular symptoms. He is also aware that he's had this "for years". He presently notes it's not that "much of an issue". He's not had any specific groin discomfort. He does have bilateral knee pain and that seems to be "worse" with deep knee bend squats walking up and down stairs. The pain is localized along the anterior aspect of his knee. He's  not had any sensation of his knee giving way or swelling. He's had a history of 4 coronary stents and no longer is taking any blood thinning agents.  HPI  Review of Systems   Objective: Vital Signs: BP 114/75   Pulse 70   Resp 14   Ht 5\' 8"  (1.727 m)   Wt 175 lb (79.4 kg)   BMI 26.61 kg/m   Physical Exam  Ortho Exam straight leg raise negative bilaterally. Painless range of motion of both hips with internal/external rotation. No obvious thigh atrophy one side or the other. Mild patella crepitation with flexion and extension. Mild lateral patellar pain right knee. Not on the left. No calf pain. No swelling distally. Neurovascular exam intact. No popliteal mass. No joint pain along the medial or  lateral joint lines of either knee. No evidence of instability. No effusion.  Specialty Comments:  No specialty comments available.  Imaging: Xr Knee 3 View Left  Result Date: 01/25/2017 Films the left ear. 3 projection standing. The AP view the joint spaces are well-maintained. There is a very minimal early osteophyte along the medial femoral condyle. No ectopic calcification. No malalignment. Joint surfaces are smooth and symmetrical mild patellofemoral arthritis with some spur formation laterally patella  Xr Knee 3  View Right  Result Date: 01/25/2017 Films the right knee retained in 3 projections standing. There is approximately 1 of varus. However, the joint spaces appear to be well maintained. Minimal early osteophyte along the medial femoral condyle. No ectopic calcification. Lateral patella tilt with decrease in the lateral joint space and some early patellofemoral spur formation.  Xr Lumbar Spine 2-3 Views  Result Date: 01/25/2017 Films of the lumbar spine obtained in the AP and lateral projections there are degenerative changes of the facet joints of L4-5 and L5-S1. Very minimal degenerative scoliosis to the left. Disc spaces maintained. No evidence of listhesis.  Xr Pelvis 1-2  Views  Result Date: 01/25/2017 AP the pelvis demonstrated no evidence of osteoarthritis of either hip. Hip joints well maintained. No ectopic calcification. Sacroiliac joints intact. No bony abnormalities either hemipelvis. Greater trochanteric region normal    PMFS History: Patient Active Problem List   Diagnosis Date Noted  . Essential hypertension 01/13/2016  . Coronary artery disease involving native coronary artery of native heart without angina pectoris 01/13/2016  . Unstable angina ([place])   . ED (erectile dysfunction) 02/15/2014  . Allergy to contrast media (used for diagnostic x-rays) 05/12/2011  . Hypertension 05/09/2011  . Hyperlipidemia 05/09/2011  . DM2 (diabetes mellitus, type 2) ([place]) 07/10/2010  . CAD S/P percutaneous coronary angioplasty 07/10/2010  . DYSPEPSIA&OTHER [facility] DISORDERS FUNCTION STOMACH 07/10/2010   Past Medical History:  Diagnosis Date  . Arthritis   . Chronic lower back pain   . Coronary artery disease    a. Multiple prior caths/PCI. Cath 2013 with possible spasm of RCA, 70% ISR of mid LCx with subsequent DES to mLCx and prox LCX. b. H/o microvascular angina. c. Recurrent angina 08/2014 - s/p PTCA/DES to prox Cx, PTCA/CBA to OM1.  c. LHC 06/10/15 with patent stents and some ISR in LCX and OM-1 that was not flow limiting --> Rx   . Dyslipidemia    a. Intolerant to many statins except tolerating Livalo.  Marland Kitchen GERD (gastroesophageal reflux disease)   . Hypertension   . Myocardial infarction ([place]) ~ 2010  . S/P angioplasty with stent, DES, to proximal and mid LCX 12/15/11 12/15/2011  . Type II diabetes mellitus ([name])     Family History  Problem Relation Age of Onset  . Leukemia Mother   . Prostate cancer Father   . Coronary artery disease Paternal Grandmother   . Cancer Paternal Grandfather   . Cancer Brother     Past Surgical History:  Procedure Laterality Date  . CARDIAC CATHETERIZATION  06/15/2002   LAD with prox 40% stenosis, norma L main, Cfx with  25% lesion, RCA with long mid 25% stenosis (Dr. Vita Barley)  . CARDIAC CATHETERIZATION  04/01/2010   normal L main, LAD wit mild stenosis, L Cfx with 70% in-stent restenosis, RCA with 70% in-stent restenosis, LVEF >60% (Dr. K. Mali Hilty) - cutting ballon arthrectomy to RCA & Cfx (Dr. Rockne Menghini)  . CARDIAC CATHETERIZATION  08/25/2010   preserved global LV contractility; multivessel CAD, diffuse 90-95% in-stent restenosis in prox placed Cfx stent - cutting balloon arthrectomy in Cfx with multiple dilatations 90-95% to 0% stenosis (Dr. Corky Downs)  . CARDIAC CATHETERIZATION  01/26/2011   PCI & stenting of aggresive in-stent restenosis within previously stented AV groove Cfx with 3.0x31mm Taxus DES (previous stents were Promus) (Dr. Adora Fridge)  . CARDIAC CATHETERIZATION  05/11/2011   preserved LV function, 40% mid LAD stenosis, 30-40% narrowing proximal to stented semgnet of prox Cfx, patent  mid RCA stent with smooth 20% narrowing in distal RCA (Dr. Corky Downs)  . CARDIAC CATHETERIZATION  12/15/2011   PCI & stenting of proximal & mid Cfx with DES - 3.0x45mm in proximal, 3.0x21mm in mid (Dr. Adora Fridge)  . CARDIAC CATHETERIZATION N/A 06/10/2015   Procedure: Left Heart Cath and Coronary Angiography;  Surgeon: Jolaine Artist, MD;  Location: [facility];  Service: Cardiovascular;  Laterality: N/A;  . cardiometabolic testing  4/53/6468   good exercise effort, peak VO2 79% predicted with normal VO2 HR curves (mild deconditioning)  . EXCISIONAL HEMORRHOIDECTOMY  1984  . LEFT HEART CATHETERIZATION WITH CORONARY ANGIOGRAM N/A 05/11/2011   Procedure: LEFT HEART CATHETERIZATION WITH CORONARY ANGIOGRAM;  Surgeon: Troy Sine, MD;  Location: [facility] CATH LAB;  Service: Cardiovascular;  Laterality: N/A;  Possible percutaneous coronary intervention, possible IVUS  . LEFT HEART CATHETERIZATION WITH CORONARY ANGIOGRAM N/A 12/15/2011   Procedure: LEFT HEART CATHETERIZATION WITH CORONARY ANGIOGRAM;  Surgeon: Lorretta Harp, MD;  [facility] C[facility] LAB;  Service: Cardiovascular;  Laterality: N/A;  . LEFT HEART CATHETERIZATION WITH CORONARY ANGIOGRAM N/A 09/05/2014   Procedure: LEFT HEART CATHETERIZATION WITH CORONARY ANGIOGRAM;  Surgeon: Burnell Blanks, MD;  Location: [facility] CATH LAB;  Service: Cardiovascular;  Laterality: N/A;  . LIPOMA EXCISION     back of the head  . NM MYOCAR PERF WALL MOTION  02/2012   lexiscan myoview; mild perfusion defect in mid inferolateral & basal inferolateral region (infarct/scar); EF 52%, abnormal but ow risk scan  . PERCUTANEOUS CORONARY STENT INTERVENTION (PCI-S)  09/05/2014   Procedure: PERCUTANEOUS CORONARY STENT INTERVENTION (PCI-S);  Surgeon: Burnell Blanks, MD;  Location: [facility] CATH LAB;  Service: Cardiovascular;;   Social History   Occupational History  . Not on file.   Social History Main Topics  . Smoking status: Former Smoker    Packs/day: 1.00    Years: 10.00    Types: Cigarettes    Quit date: 04/07/2009  . Smokeless tobacco: Never Used  . Alcohol use No     Comment: 12/15/11 "til 2010, drank a pint of gin/day at least"  . Drug use: No     Comment: 12/15/11 "last cocaine was 2010"  . Sexual activity: Not Currently     Garald Balding, MD   Note - This record has been created using [employer].  Chart creation errors have been sought, but may not always  have been located. Such creation errors do not reflect on  the standard of medical care.

## 2017-02-01 ENCOUNTER — Telehealth: Payer: Self-pay

## 2017-02-01 MED ORDER — LISINOPRIL 5 MG PO TABS: 5.0000 mg | ORAL_TABLET | Freq: Every day | ORAL | 2 refills | Status: DC

## 2017-02-01 NOTE — Telephone Encounter (Signed)
Lisinopril has been sent to the [facility] on [address]

## 2017-02-11 ENCOUNTER — Other Ambulatory Visit: Payer: Self-pay

## 2017-02-11 MED ORDER — CARVEDILOL 12.5 MG PO TABS: 25.0000 mg | ORAL_TABLET | Freq: Every day | ORAL | 1 refills | Status: DC

## 2017-02-17 ENCOUNTER — Other Ambulatory Visit: Payer: Self-pay

## 2017-02-17 MED ORDER — CARVEDILOL 12.5 MG PO TABS: 25.0000 mg | ORAL_TABLET | Freq: Every day | ORAL | 1 refills | Status: DC

## 2017-02-17 NOTE — Telephone Encounter (Signed)
Refill for carvedilol sent into pharmacy. Thanks!

## 2017-03-02 ENCOUNTER — Telehealth: Payer: Self-pay

## 2017-03-02 MED ORDER — CARVEDILOL 12.5 MG PO TABS: 25.0000 mg | ORAL_TABLET | Freq: Every day | ORAL | 1 refills | Status: DC

## 2017-03-02 NOTE — Telephone Encounter (Signed)
Patient notified that he needs to stay on the 12.5 2 times a day and medication was sent to [facility] on [address]

## 2017-03-03 ENCOUNTER — Ambulatory Visit: Payer: Self-pay | Admitting: Family Medicine

## 2017-05-04 ENCOUNTER — Emergency Department (HOSPITAL_COMMUNITY)
Admission: EM | Admit: 2017-05-04 | Discharge: 2017-05-04 | Disposition: A | Discharge status: Home / Self Care | Payer: Medicaid Other | Attending: Emergency Medicine | Admitting: Emergency Medicine

## 2017-05-04 ENCOUNTER — Encounter (HOSPITAL_COMMUNITY): Payer: Self-pay | Admitting: *Deleted

## 2017-05-04 ENCOUNTER — Other Ambulatory Visit: Payer: Self-pay

## 2017-05-04 DIAGNOSIS — Z5321 Procedure and treatment not carried out due to patient leaving prior to being seen by health care provider: Secondary | ICD-10-CM | POA: Insufficient documentation

## 2017-05-04 DIAGNOSIS — M542 Cervicalgia: Secondary | ICD-10-CM | POA: Diagnosis present
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
## 2017-05-04 NOTE — ED Notes (Signed)
No answer from pt in waiting room 

## 2017-05-05 ENCOUNTER — Telehealth: Payer: Self-pay | Admitting: Internal Medicine

## 2017-05-05 NOTE — Telephone Encounter (Signed)
Returned call to patient no answer.LMTC. 

## 2017-05-05 NOTE — Telephone Encounter (Signed)
New message  Patient states he has chest discomfort, off and on.  A slight burning sensation on exertion.  Pt c/o of Chest Pain: STAT if CP now or developed within 24 hours  1. Are you having CP right now? NO  2. Are you experiencing any other symptoms (ex. SOB, nausea, vomiting, sweating)?NO  3. How long have you been experiencing CP?  1 month  4. Is your CP continuous or coming and going?  Coming and going  5. Have you taken Nitroglycerin? NO ?

## 2017-05-06 NOTE — Telephone Encounter (Signed)
Left message for pt to call.

## 2017-05-12 NOTE — Telephone Encounter (Signed)
[email or web address]

## 2017-05-13 NOTE — Telephone Encounter (Signed)
[email or web address] 

## 2017-05-13 NOTE — Telephone Encounter (Signed)
Attempted call multiple times-will await call back from pt.

## 2017-05-24 ENCOUNTER — Ambulatory Visit: Payer: Medicaid Other | Admitting: Internal Medicine

## 2017-06-09 ENCOUNTER — Ambulatory Visit: Payer: Medicaid Other | Admitting: Family Medicine

## 2017-06-10 ENCOUNTER — Ambulatory Visit: Payer: Medicaid Other | Admitting: Internal Medicine

## 2017-06-21 ENCOUNTER — Other Ambulatory Visit: Payer: Self-pay

## 2017-06-21 DIAGNOSIS — I10 Essential (primary) hypertension: Secondary | ICD-10-CM | POA: Insufficient documentation

## 2017-06-21 DIAGNOSIS — Z7982 Long term (current) use of aspirin: Secondary | ICD-10-CM | POA: Diagnosis not present

## 2017-06-21 DIAGNOSIS — Z9114 Patient's other noncompliance with medication regimen: Secondary | ICD-10-CM | POA: Diagnosis not present

## 2017-06-21 DIAGNOSIS — R45851 Suicidal ideations: Secondary | ICD-10-CM | POA: Diagnosis not present

## 2017-06-21 DIAGNOSIS — F329 Major depressive disorder, single episode, unspecified: Secondary | ICD-10-CM | POA: Diagnosis not present

## 2017-06-21 DIAGNOSIS — Z79899 Other long term (current) drug therapy: Secondary | ICD-10-CM | POA: Diagnosis not present

## 2017-06-21 DIAGNOSIS — E119 Type 2 diabetes mellitus without complications: Secondary | ICD-10-CM | POA: Diagnosis not present

## 2017-06-21 DIAGNOSIS — Z046 Encounter for general psychiatric examination, requested by authority: Secondary | ICD-10-CM | POA: Diagnosis not present

## 2017-06-21 DIAGNOSIS — Z955 Presence of coronary angioplasty implant and graft: Secondary | ICD-10-CM | POA: Diagnosis not present

## 2017-06-21 DIAGNOSIS — Z7984 Long term (current) use of oral hypoglycemic drugs: Secondary | ICD-10-CM | POA: Insufficient documentation

## 2017-06-21 DIAGNOSIS — Z87891 Personal history of nicotine dependence: Secondary | ICD-10-CM | POA: Insufficient documentation

## 2017-06-21 DIAGNOSIS — I2510 Atherosclerotic heart disease of native coronary artery without angina pectoris: Secondary | ICD-10-CM | POA: Diagnosis not present

## 2017-06-21 LAB — RAPID URINE DRUG SCREEN, HOSP PERFORMED
Amphetamines: NOT DETECTED
Barbiturates: NOT DETECTED
Benzodiazepines: NOT DETECTED
Cocaine: NOT DETECTED
Opiates: NOT DETECTED
Tetrahydrocannabinol: NOT DETECTED

## 2017-06-21 LAB — SALICYLATE LEVEL: Salicylate Lvl: <7 mg/dL (ref 2.8–30.0)

## 2017-06-21 LAB — COMPREHENSIVE METABOLIC PANEL WITH GFR
ALT: 48 U/L (ref 17–63)
AST: 36 U/L (ref 15–41)
Albumin: 4.1 g/dL (ref 3.5–5.0)
Alkaline Phosphatase: 54 U/L (ref 38–126)
Anion gap: 8 (ref 5–15)
BUN: 15 mg/dL (ref 6–20)
CO2: 25 mmol/L (ref 22–32)
Calcium: 9.4 mg/dL (ref 8.9–10.3)
Chloride: 104 mmol/L (ref 101–111)
Creatinine, Ser: 0.92 mg/dL (ref 0.61–1.24)
GFR calc Af Amer: >60 mL/min (ref >60)
GFR calc non Af Amer: >60 mL/min (ref >60)
Glucose, Bld: 86 mg/dL (ref 65–99)
Potassium: 4.1 mmol/L (ref 3.5–5.1)
Sodium: 137 mmol/L (ref 135–145)
Total Bilirubin: 0.3 mg/dL (ref 0.3–1.2)
Total Protein: 6.7 g/dL (ref 6.5–8.1)

## 2017-06-21 LAB — CBC
HEMATOCRIT: 42.1 % (ref 39.0–52.0)
HEMOGLOBIN: 13.9 g/dL (ref 13.0–17.0)
MCH: 28.7 pg (ref 26.0–34.0)
MCHC: 33 g/dL (ref 30.0–36.0)
MCV: 86.8 fL (ref 78.0–100.0)
Platelets: 261 10*3/uL (ref 150–400)
RBC: 4.85 MIL/uL (ref 4.22–5.81)
RDW: 12.5 % (ref 11.5–15.5)
WBC: 6.9 10*3/uL (ref 4.0–10.5)

## 2017-06-21 LAB — ACETAMINOPHEN LEVEL

## 2017-06-21 LAB — ETHANOL: Alcohol, Ethyl (B): <10 mg/dL (ref <10)

## 2017-06-21 NOTE — ED Triage Notes (Addendum)
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

## 2017-06-22 ENCOUNTER — Inpatient Hospital Stay (HOSPITAL_COMMUNITY)
Admission: AD | Admit: 2017-06-22 | Discharge: 2017-06-28 | DRG: 885 | Disposition: A | Discharge status: Home / Self Care | Payer: Medicaid Other | Source: Intra-hospital | Attending: Psychiatry | Admitting: Psychiatry

## 2017-06-22 ENCOUNTER — Emergency Department (HOSPITAL_COMMUNITY)
Admission: EM | Admit: 2017-06-22 | Discharge: 2017-06-22 | Disposition: A | Discharge status: Other Acute Inpatient Hospital | Payer: Medicaid Other | Attending: Emergency Medicine | Admitting: Emergency Medicine

## 2017-06-22 ENCOUNTER — Other Ambulatory Visit: Payer: Self-pay

## 2017-06-22 ENCOUNTER — Encounter (HOSPITAL_COMMUNITY): Payer: Self-pay | Admitting: *Deleted

## 2017-06-22 DIAGNOSIS — G8929 Other chronic pain: Secondary | ICD-10-CM | POA: Diagnosis present

## 2017-06-22 DIAGNOSIS — Z7982 Long term (current) use of aspirin: Secondary | ICD-10-CM | POA: Diagnosis not present

## 2017-06-22 DIAGNOSIS — N529 Male erectile dysfunction, unspecified: Secondary | ICD-10-CM | POA: Diagnosis present

## 2017-06-22 DIAGNOSIS — R45851 Suicidal ideations: Secondary | ICD-10-CM

## 2017-06-22 DIAGNOSIS — E119 Type 2 diabetes mellitus without complications: Secondary | ICD-10-CM | POA: Diagnosis present

## 2017-06-22 DIAGNOSIS — Z87891 Personal history of nicotine dependence: Secondary | ICD-10-CM

## 2017-06-22 DIAGNOSIS — Z915 Personal history of self-harm: Secondary | ICD-10-CM | POA: Diagnosis not present

## 2017-06-22 DIAGNOSIS — I10 Essential (primary) hypertension: Secondary | ICD-10-CM | POA: Diagnosis present

## 2017-06-22 DIAGNOSIS — Z955 Presence of coronary angioplasty implant and graft: Secondary | ICD-10-CM

## 2017-06-22 DIAGNOSIS — Z91013 Allergy to seafood: Secondary | ICD-10-CM

## 2017-06-22 DIAGNOSIS — F1910 Other psychoactive substance abuse, uncomplicated: Secondary | ICD-10-CM | POA: Diagnosis not present

## 2017-06-22 DIAGNOSIS — F322 Major depressive disorder, single episode, severe without psychotic features: Secondary | ICD-10-CM | POA: Diagnosis present

## 2017-06-22 DIAGNOSIS — F332 Major depressive disorder, recurrent severe without psychotic features: Secondary | ICD-10-CM | POA: Diagnosis not present

## 2017-06-22 DIAGNOSIS — R450 Nervousness: Secondary | ICD-10-CM | POA: Diagnosis not present

## 2017-06-22 DIAGNOSIS — Z7984 Long term (current) use of oral hypoglycemic drugs: Secondary | ICD-10-CM

## 2017-06-22 DIAGNOSIS — I2510 Atherosclerotic heart disease of native coronary artery without angina pectoris: Secondary | ICD-10-CM | POA: Diagnosis present

## 2017-06-22 DIAGNOSIS — M1990 Unspecified osteoarthritis, unspecified site: Secondary | ICD-10-CM | POA: Diagnosis present

## 2017-06-22 DIAGNOSIS — F419 Anxiety disorder, unspecified: Secondary | ICD-10-CM | POA: Diagnosis not present

## 2017-06-22 DIAGNOSIS — F39 Unspecified mood [affective] disorder: Secondary | ICD-10-CM | POA: Diagnosis not present

## 2017-06-22 DIAGNOSIS — Z818 Family history of other mental and behavioral disorders: Secondary | ICD-10-CM | POA: Diagnosis not present

## 2017-06-22 DIAGNOSIS — Z9861 Coronary angioplasty status: Secondary | ICD-10-CM

## 2017-06-22 DIAGNOSIS — M545 Low back pain: Secondary | ICD-10-CM | POA: Diagnosis present

## 2017-06-22 DIAGNOSIS — F064 Anxiety disorder due to known physiological condition: Secondary | ICD-10-CM | POA: Diagnosis present

## 2017-06-22 DIAGNOSIS — Z91030 Bee allergy status: Secondary | ICD-10-CM | POA: Diagnosis not present

## 2017-06-22 DIAGNOSIS — E785 Hyperlipidemia, unspecified: Secondary | ICD-10-CM | POA: Diagnosis present

## 2017-06-22 DIAGNOSIS — Z811 Family history of alcohol abuse and dependence: Secondary | ICD-10-CM | POA: Diagnosis not present

## 2017-06-22 DIAGNOSIS — K219 Gastro-esophageal reflux disease without esophagitis: Secondary | ICD-10-CM | POA: Diagnosis present

## 2017-06-22 DIAGNOSIS — Z79899 Other long term (current) drug therapy: Secondary | ICD-10-CM

## 2017-06-22 DIAGNOSIS — I252 Old myocardial infarction: Secondary | ICD-10-CM | POA: Diagnosis not present

## 2017-06-22 DIAGNOSIS — M549 Dorsalgia, unspecified: Secondary | ICD-10-CM | POA: Diagnosis not present

## 2017-06-22 DIAGNOSIS — G4710 Hypersomnia, unspecified: Secondary | ICD-10-CM | POA: Diagnosis present

## 2017-06-22 DIAGNOSIS — F323 Major depressive disorder, single episode, severe with psychotic features: Secondary | ICD-10-CM | POA: Diagnosis present

## 2017-06-22 DIAGNOSIS — F1010 Alcohol abuse, uncomplicated: Secondary | ICD-10-CM | POA: Diagnosis present

## 2017-06-22 DIAGNOSIS — Z888 Allergy status to other drugs, medicaments and biological substances status: Secondary | ICD-10-CM | POA: Diagnosis not present

## 2017-06-22 DIAGNOSIS — F329 Major depressive disorder, single episode, unspecified: Secondary | ICD-10-CM | POA: Diagnosis not present

## 2017-06-22 DIAGNOSIS — Z791 Long term (current) use of non-steroidal anti-inflammatories (NSAID): Secondary | ICD-10-CM

## 2017-06-22 MED ORDER — NAPROXEN 375 MG PO TABS
375.0000 mg | ORAL_TABLET | Freq: Two times a day (BID) | ORAL | Status: DC
  Filled 2017-06-22 (×2): qty 1

## 2017-06-22 MED ORDER — CARVEDILOL 25 MG PO TABS
25.0000 mg | ORAL_TABLET | Freq: Every day | ORAL | Status: DC
  Administered 2017-06-22 – 2017-06-28 (×7): 25 mg via ORAL
  Filled 2017-06-22: qty 1
  Filled 2017-06-22: qty 2
  Filled 2017-06-22 (×8): qty 1

## 2017-06-22 MED ORDER — NITROGLYCERIN 0.4 MG SL SUBL: 0.4000 mg | SUBLINGUAL_TABLET | SUBLINGUAL | Status: DC | PRN

## 2017-06-22 MED ORDER — NAPROXEN 375 MG PO TABS
375.0000 mg | ORAL_TABLET | Freq: Two times a day (BID) | ORAL | Status: DC | PRN
  Administered 2017-06-23: 375 mg via ORAL
  Filled 2017-06-22: qty 1

## 2017-06-22 MED ORDER — ASPIRIN EC 81 MG PO TBEC
81.0000 mg | DELAYED_RELEASE_TABLET | Freq: Every day | ORAL | Status: DC
  Administered 2017-06-22 – 2017-06-28 (×7): 81 mg via ORAL
  Filled 2017-06-22 (×10): qty 1

## 2017-06-22 MED ORDER — GABAPENTIN 100 MG PO CAPS
100.0000 mg | ORAL_CAPSULE | Freq: Three times a day (TID) | ORAL | Status: DC
  Administered 2017-06-22 – 2017-06-28 (×18): 100 mg via ORAL
  Filled 2017-06-22 (×25): qty 1

## 2017-06-22 MED ORDER — LISINOPRIL 5 MG PO TABS
5.0000 mg | ORAL_TABLET | Freq: Every day | ORAL | Status: DC
  Administered 2017-06-22 – 2017-06-24 (×3): 5 mg via ORAL
  Filled 2017-06-22 (×6): qty 1

## 2017-06-22 MED ORDER — METFORMIN HCL ER 500 MG PO TB24
500.0000 mg | ORAL_TABLET | Freq: Every day | ORAL | Status: DC
  Administered 2017-06-23 – 2017-06-28 (×6): 500 mg via ORAL
  Filled 2017-06-22 (×9): qty 1

## 2017-06-22 MED ORDER — MIRTAZAPINE 7.5 MG PO TABS
7.5000 mg | ORAL_TABLET | Freq: Every day | ORAL | Status: DC
  Administered 2017-06-22 – 2017-06-23 (×2): 7.5 mg via ORAL
  Filled 2017-06-22 (×5): qty 1

## 2017-06-22 MED ORDER — CO Q 10 10 MG PO CAPS: ORAL_CAPSULE | Freq: Every day | ORAL | Status: DC

## 2017-06-22 MED ORDER — METFORMIN HCL ER 500 MG PO TB24
500.0000 mg | ORAL_TABLET | Freq: Every day | ORAL | Status: DC
  Filled 2017-06-22: qty 1

## 2017-06-22 MED ORDER — ASPIRIN EC 81 MG PO TBEC: 81.0000 mg | DELAYED_RELEASE_TABLET | Freq: Every day | ORAL | Status: DC

## 2017-06-22 MED ORDER — GABAPENTIN 300 MG PO CAPS
600.0000 mg | ORAL_CAPSULE | Freq: Every day | ORAL | Status: DC
  Filled 2017-06-22: qty 2

## 2017-06-22 MED ORDER — GABAPENTIN 300 MG PO CAPS: 600.0000 mg | ORAL_CAPSULE | Freq: Every day | ORAL | Status: DC

## 2017-06-22 MED ORDER — CARVEDILOL 12.5 MG PO TABS: 25.0000 mg | ORAL_TABLET | Freq: Every day | ORAL | Status: DC

## 2017-06-22 MED ORDER — LISINOPRIL 10 MG PO TABS: 5.0000 mg | ORAL_TABLET | Freq: Every day | ORAL | Status: DC

## 2017-06-22 MED ORDER — PRAVASTATIN SODIUM 40 MG PO TABS: 80.0000 mg | ORAL_TABLET | Freq: Every day | ORAL | Status: DC

## 2017-06-22 NOTE — ED Provider Notes (Addendum)
TIME SEEN: 2:10 AM  CHIEF COMPLAINT: Suicidal thoughts  HPI: Patient is a 56 year old male with history of CAD status post stents, hypertension, diabetes who presents to the emergency department with suicidal thoughts.  He states that he does not want to be on this earth anymore.  He states that he feels like he is taking out on himself by not taking care of himself by not taking his medications and eating food that is bad for him.  He has no active plan and no HI.  No hallucinations.  Does not drink alcohol regularly but last drink was 3 days ago.  Denies any drug use.  He feels he would benefit from inpatient psychiatric treatment.  No current chest pain, shortness of breath, fever, cough, vomiting, diarrhea.  Reports he has a burning pain in his hands and feet that is progressively worsening over the past several weeks from his diabetic neuropathy.  Patient is supposed to be on gabapentin.  ROS: See HPI Constitutional: no fever  Eyes: no drainage  ENT: no runny nose   Cardiovascular:  no chest pain  Resp: no SOB  GI: no vomiting GU: no dysuria Integumentary: no rash  Allergy: no hives  Musculoskeletal: no leg swelling  Neurological: no slurred speech ROS otherwise negative  PAST MEDICAL HISTORY/PAST SURGICAL HISTORY:  Past Medical History:  Diagnosis Date  . Arthritis   . Chronic lower back pain   . Coronary artery disease    a. Multiple prior caths/PCI. Cath 2013 with possible spasm of RCA, 70% ISR of mid LCx with subsequent DES to mLCx and prox LCX. b. H/o microvascular angina. c. Recurrent angina 08/2014 - s/p PTCA/DES to prox Cx, PTCA/CBA to OM1.  c. LHC 06/10/15 with patent stents and some ISR in LCX and OM-1 that was not flow limiting --> Rx   . Dyslipidemia    a. Intolerant to many statins except tolerating Livalo.  Marland Kitchen GERD (gastroesophageal reflux disease)   . Hypertension   . Myocardial infarction ([place]) ~ 2010  . S/P angioplasty with stent, DES, to proximal and mid LCX  12/15/11 12/15/2011  . Type II diabetes mellitus ([name])     MEDICATIONS:  Prior to Admission medications   Medication Sig Start Date End Date Taking? Authorizing Provider  acetaminophen-codeine (TYLENOL #4) 300-60 MG tablet Take 1 tablet by mouth every 8 (eight) hours as needed for moderate pain. 10/13/16   Lanae Crumbly, PA-C  aspirin EC 81 MG tablet Take 1 tablet (81 mg total) by mouth daily. 01/18/17   Hilty, Nadean Corwin, MD  carvedilol (COREG) 12.5 MG tablet Take 2 tablets (25 mg total) by mouth daily. 03/02/17   Scot Jun, FNP  Coenzyme Q10 (CO Q 10 PO) Take 1 capsule by mouth at bedtime.    [provider]  cyclobenzaprine (FLEXERIL) 10 MG tablet Take 1 tablet (10 mg total) by mouth 3 (three) times daily as needed for muscle spasms. 07/11/15   Erlene Quan, PA-C  gabapentin (NEURONTIN) 300 MG capsule Take 2 capsules (600 mg total) by mouth at bedtime. 10/02/14   [name], MD  l-methylfolate-B6-B12 (METANX) 3-35-2 MG TABS tablet Take 1 tablet by mouth daily. 01/18/17   Hilty, Nadean Corwin, MD  lisinopril (PRINIVIL,ZESTRIL) 5 MG tablet Take 1 tablet (5 mg total) by mouth daily. 02/01/17   Scot Jun, FNP  metFORMIN (GLUCOPHAGE-XR) 500 MG 24 hr tablet Take 1 tablet (500 mg total) by mouth daily with breakfast. 01/12/17   Scot Jun,  FNP  naproxen (NAPROSYN) 375 MG tablet Take 1 tablet (375 mg total) by mouth 2 (two) times daily. 10/11/16   Malvin Johns, MD  nitroGLYCERIN (NITROSTAT) 0.4 MG SL tablet Place 1 tablet (0.4 mg total) under the tongue every 5 (five) minutes as needed. For chest pain 08/28/13   Pixie Casino, MD  Omega-3 Fatty Acids (FISH OIL) 1200 MG CAPS Take 1 capsule (1,200 mg total) by mouth daily. 07/11/15   Erlene Quan, PA-C  Pitavastatin Calcium (LIVALO) 4 MG TABS Take 1 tablet (4 mg total) by mouth daily. 01/18/17   Pixie Casino, MD  sildenafil (REVATIO) 20 MG tablet Take 2 to 3 tablets as needed for sexual activity 07/11/15   Erlene Quan, PA-C   testosterone (ANDROGEL) 50 MG/5GM (1%) GEL Place 5 g onto the skin daily. 12/20/16 02/18/17  Scot Jun, FNP  Testosterone Cypionate 200 MG/ML KIT Inject 0.8 mLs into the muscle once a week.    [provider]  traMADol (ULTRAM) 50 MG tablet Take 1 tablet (50 mg total) by mouth every 6 (six) hours as needed. 10/30/16   Meredith Pel, MD    ALLERGIES:  Allergies  Allergen Reactions  . Bee Venom Anaphylaxis and Hives  . Shellfish Allergy Anaphylaxis and Hives  . Statins Other (See Comments)    Myalgias. Tolerating livalo.   . Testosterone Cypionate     Testerone Injection --Increased breast tissue     SOCIAL HISTORY:  Social History   Tobacco Use  . Smoking status: Former Smoker    Packs/day: 1.00    Years: 10.00    Pack years: 10.00    Types: Cigarettes    Last attempt to quit: 04/07/2009    Years since quitting: 8.2  . Smokeless tobacco: Never Used  Substance Use Topics  . Alcohol use: No    Comment: 12/15/11 "til 2010, drank a pint of gin/day at least"    FAMILY HISTORY: Family History  Problem Relation Age of Onset  . Leukemia Mother   . Prostate cancer Father   . Coronary artery disease Paternal Grandmother   . Cancer Paternal Grandfather   . Cancer Brother     EXAM: BP (!) 165/111 (BP Location: Right Arm)   Pulse 73   Temp 97.9 F (36.6 C) (Oral)   Resp 17   SpO2 100%  CONSTITUTIONAL: Alert and oriented and responds appropriately to questions. Well-appearing; well-nourished HEAD: Normocephalic EYES: Conjunctivae clear, pupils appear equal, EOMI ENT: normal nose; moist mucous membranes NECK: Supple, no meningismus, no nuchal rigidity, no LAD  CARD: RRR; S1 and S2 appreciated; no murmurs, no clicks, no rubs, no gallops RESP: Normal chest excursion without splinting or tachypnea; breath sounds clear and equal bilaterally; no wheezes, no rhonchi, no rales, no hypoxia or respiratory distress, speaking full sentences ABD/GI: Normal bowel  sounds; non-distended; soft, non-tender, no rebound, no guarding, no peritoneal signs, no hepatosplenomegaly BACK:  The back appears normal and is non-tender to palpation, there is no CVA tenderness EXT: Normal ROM in all joints; non-tender to palpation; no edema; normal capillary refill; no cyanosis, no calf tenderness or swelling    SKIN: Normal color for age and race; warm; no rash NEURO: Moves all extremities equally PSYCH: Reports passive suicidality.  No HI.  No plan.  No hallucinations.  MEDICAL DECISION MAKING: Patient here with passive suicidality.  States that he does not want to be here anymore.  He is here voluntarily.  No current medical complaints.  Medical screening labs are unremarkable.  Will restart his home medications.  Will discuss with TTS for further evaluation.  Patient is medically clear at this time.  ED PROGRESS: TTS has evaluated the patient.  I feel patient would benefit from inpatient psychiatric treatment and patient agrees.  TTS will search for placement.  I reviewed all nursing notes, vitals, pertinent previous records, EKGs, lab and urine results, imaging (as available).  5:46 AM  D/w TTS.  [facility] Dr. Parke Poisson.  He can come after 8 AM.    Jersee Winiarski, Delice Bison, DO 06/22/17 0422    Shyanna Klingel, Delice Bison, DO 06/22/17 [phone]

## 2017-06-22 NOTE — BHH Counselor (Signed)
Per Herbert Spires, [facility] pt has been accepted to [facility] and assigned to room/bed: 401-1, after 0800. Attending physician: Dr. Parke Poisson. Nursing report: [phone]. Updated disposition discussed with Dr. Leonides Schanz.    Vertell Novak, MS, [facility], [facility] Triage Specialist [phone]

## 2017-06-22 NOTE — BH Assessment (Addendum)
Tele Assessment Note   Patient Name: [name] MRN: [id number] Referring Physician: Dr. Leonides Schanz Location of Patient: [facility] Location of Provider: [place]  [name] is an 56 y.o. male, who presents voluntary and unaccompanied to [facility]. Clinician asked the pt, "what brought you to the hospital?" Pt reported, his wife two days before Mother's Day and his mother died, three weeks ago. Pt reported, he has been depressed for twenty days. Pt reported, he has Diabetes and he's lost a lot of weight sticking to his strict regimen. Pt reported, he was a vegetarian for the past three and half years, living a healthier lifestyle (going to the gym, eating better) however since his mother died he has been eating foods high in fat and sugar. Pt reported, three weeks ago, he started drinking alcohol and smoking cigarettes after four years of sobriety. Pt reported, he has been having thoughts of not wanting to be on this Earth anymore. Pt reported, he feels like theres nothing that drives him to go on. Pt reported in 1993, he attempted suicide by driving his car off a bridge in [place], [place]. Pt reported, in 2005 he took all his pills in his medicine cabinet; and the medications "messed up" his liver. Pt reported, access to weapons: .38, a Ruger and kitchen knives. Pt reported, reported his guns are hidden. Pt denies. HI, AVH, and self-injurious behaviors.   Pt denies abuse. Pt's UDS is negative. Pt reported, he stop taking psychotropic medications because he has not has a depressive episode in several years. Pt reported, previous inpatient admissions.   Pt presents quiet/awake in scrubs with logical/coherent speech. Pt's eye contact was fair. Pt's mood was depressed. Pt's affect was appropriate to circumstance. Pt's thought process was partial. Pt's judgement was partial. Pt's concentration was normal. Pt's insight was fair. Pt's impulse control was good. Pt was oriented x4. Pt reported, if  discharged from [facility] he could not contract for safety. Pt reported, if inpatient treatment is recommended he would sign-in voluntarily.     Diagnosis: F32.2 Major Depressive Disorder, Single episode, Severe without Psychotic Features.   Past Medical History:  Past Medical History:  Diagnosis Date  . Arthritis   . Chronic lower back pain   . Coronary artery disease    a. Multiple prior caths/PCI. Cath 2013 with possible spasm of RCA, 70% ISR of mid LCx with subsequent DES to mLCx and prox LCX. b. H/o microvascular angina. c. Recurrent angina 08/2014 - s/p PTCA/DES to prox Cx, PTCA/CBA to OM1.  c. LHC 06/10/15 with patent stents and some ISR in LCX and OM-1 that was not flow limiting --> Rx   . Dyslipidemia    a. Intolerant to many statins except tolerating Livalo.  Marland Kitchen GERD (gastroesophageal reflux disease)   . Hypertension   . Myocardial infarction ([place]) ~ 2010  . S/P angioplasty with stent, DES, to proximal and mid LCX 12/15/11 12/15/2011  . Type II diabetes mellitus ([place])     Past Surgical History:  Procedure Laterality Date  . CARDIAC CATHETERIZATION  06/15/2002   LAD with prox 40% stenosis, norma L main, Cfx with 25% lesion, RCA with long mid 25% stenosis (Dr. Vita Barley)  . CARDIAC CATHETERIZATION  04/01/2010   normal L main, LAD wit mild stenosis, L Cfx with 70% in-stent restenosis, RCA with 70% in-stent restenosis, LVEF >60% (Dr. K. Mali Hilty) - cutting ballon arthrectomy to RCA & Cfx (Dr. Rockne Menghini)  . CARDIAC CATHETERIZATION  08/25/2010  preserved global LV contractility; multivessel CAD, diffuse 90-95% in-stent restenosis in prox placed Cfx stent - cutting balloon arthrectomy in Cfx with multiple dilatations 90-95% to 0% stenosis (Dr. Corky Downs)  . CARDIAC CATHETERIZATION  01/26/2011   PCI & stenting of aggresive in-stent restenosis within previously stented AV groove Cfx with 3.0x14mm Taxus DES (previous stents were Promus) (Dr. Adora Fridge)  . CARDIAC CATHETERIZATION  05/11/2011    preserved LV function, 40% mid LAD stenosis, 30-40% narrowing proximal to stented semgnet of prox Cfx, patent mid RCA stent with smooth 20% narrowing in distal RCA (Dr. Corky Downs)  . CARDIAC CATHETERIZATION  12/15/2011   PCI & stenting of proximal & mid Cfx with DES - 3.0x67mm in proximal, 3.0x34mm in mid (Dr. Adora Fridge)  . CARDIAC CATHETERIZATION N/A 06/10/2015   Procedure: Left Heart Cath and Coronary Angiography;  Surgeon: Jolaine Artist, MD;  Location: [place] CV LAB;  Service: Cardiovascular;  Laterality: N/A;  . cardiometabolic testing  0/27/2536   good exercise effort, peak VO2 79% predicted with normal VO2 HR curves (mild deconditioning)  . EXCISIONAL HEMORRHOIDECTOMY  1984  . LEFT HEART CATHETERIZATION WITH CORONARY ANGIOGRAM N/A 05/11/2011   Procedure: LEFT HEART CATHETERIZATION WITH CORONARY ANGIOGRAM;  Surgeon: Troy Sine, MD;  Location[facility] LAB;  Service: Cardiovascular;  Laterality: N/A;  Possible percutaneous coronary intervention, possible IVUS  . LEFT HEART CATHETERIZATION WITH CORONARY ANGIOGRAM N/A 12/15/2011   Procedure: LEFT HEART CATHETERIZATION WITH CORONARY ANGIOGRAM;  Surgeon: Lorretta Harp, MD;  Location: [facility] CATH LAB;  Service: Cardiovascular;  Laterality: N/A;  . LEFT HEART CATHETERIZATION WITH CORONARY ANGIOGRAM N/A 09/05/2014   Procedure: LEFT HEART CATHETERIZATION WITH CORONARY ANGIOGRAM;  Surgeon: Burnell Blanks, MD;  Location: [facility] CATH LAB;  Service: Cardiovascular;  Laterality: N/A;  . LIPOMA EXCISION     back of the head  . NM MYOCAR PERF WALL MOTION  02/2012   lexiscan myoview; mild perfusion defect in mid inferolateral & basal inferolateral region (infarct/scar); EF 52%, abnormal but ow risk scan  . PERCUTANEOUS CORONARY STENT INTERVENTION (PCI-S)  09/05/2014   Procedure: PERCUTANEOUS CORONARY STENT INTERVENTION (PCI-S);  Surgeon: Burnell Blanks, MD;  Location: [facility] CATH LAB;  Service: Cardiovascular;;    Family History:  Family  History  Problem Relation Age of Onset  . Leukemia Mother   . Prostate cancer Father   . Coronary artery disease Paternal Grandmother   . Cancer Paternal Grandfather   . Cancer Brother     Social History:  reports that he quit smoking about 8 years ago. His smoking use included cigarettes. He has a 10.00 pack-year smoking history. he has never used smokeless tobacco. He reports that he does not drink alcohol or use drugs.  Additional Social History:  Alcohol / Drug Use Pain Medications: See MAR Prescriptions: See MAR Over the Counter: See MAR History of alcohol / drug use?: Yes Longest period of sobriety (when/how long): Pt reported, he was sober for four years until three weeks ago.  Negative Consequences of Use: Personal relationships(Death of mother and wife. ) Substance #1 Name of Substance 1: Cigarettes.  1 - Age of First Use: UTA 1 - Amount (size/oz): Pt reported, he started back smoking, three weeks ago.  1 - Frequency: UTA 1 - Duration: UTA 1 - Last Use / Amount: UTA Substance #2 Name of Substance 2: Alcohol. 2 - Age of First Use: UTA 2 - Amount (size/oz): Pt reported, he started back drinking Gin, three weeks ago.  2 - Frequency: UTA 2 - Duration: UTA 2 - Last Use / Amount: Pt reported, three days ago.   CIWA: CIWA-Ar BP: 123/85 Pulse Rate: 62 COWS:    PATIENT STRENGTHS: (choose at least two) Average or above average intelligence Communication skills Supportive family/friends  Allergies:  Allergies  Allergen Reactions  . Bee Venom Anaphylaxis and Hives  . Shellfish Allergy Anaphylaxis and Hives  . Statins Other (See Comments)    Myalgias. Tolerating livalo.   . Testosterone Cypionate     Testerone Injection --Increased breast tissue     Home Medications:  (Not in a hospital admission)  OB/GYN Status:  No LMP for male patient.  General Assessment Data Assessment unable to be completed: Yes Reason for not completing assessment: Clinician spoke to  [facility], RN and expressed she wanted to complete pt's TTS consult. Clinician expressed since pt is in the hallway a empty room is needed to complete the TTS consult. Clinician noted there may not be any empty rooms available. Clinician provided her name and number for follow up as the RN is in with another pt.  Location of Assessment: [facility] ED TTS Assessment: In system Is this a Tele or Face-to-Face Assessment?: Tele Assessment Is this an Initial Assessment or a Re-assessment for this encounter?: Initial Assessment Marital status: Widowed Is patient pregnant?: No Pregnancy Status: No Living Arrangements: Alone Can pt return to current living arrangement?: Yes Admission Status: Voluntary Is patient capable of signing voluntary admission?: Yes Referral Source: Self/Family/Friend Insurance type: Medicaid     Crisis Care Plan Living Arrangements: Alone Legal Guardian: Other:(Self.) Name of Psychiatrist: NA Name of Therapist: NA  Education Status Is patient currently in school?: No Current Grade: NA Highest grade of school patient has completed: Pt reported, 14.  Name of school: NA Contact person: NA  Risk to self with the past 6 months Suicidal Ideation: Yes-Currently Present Has patient been a risk to self within the past 6 months prior to admission? : Yes Suicidal Intent: No Has patient had any suicidal intent within the past 6 months prior to admission? : Other (comment) Is patient at risk for suicide?: Yes Suicidal Plan?: No Has patient had any suicidal plan within the past 6 months prior to admission? : No Access to Means: Yes Specify Access to Suicidal Means: Pt reported, access to tweo guns (.38  anda ruger) and kitchen knives.  What has been your use of drugs/alcohol within the last 12 months?: Cigarettes and alcohhol. Previous Attempts/Gestures: Yes How many times?: 2 Other Self Harm Risks: Pt denies. Triggers for Past Attempts: Unknown Intentional Self Injurious  Behavior: None(Pt denies. ) Family Suicide History: Yes(Pt reported, maternal uncle and paternal grandmother.) Recent stressful life event(s): Loss (Comment)(Pt reported, loss of mother and wife this year. ) Persecutory voices/beliefs?: No Depression: Yes Depression Symptoms: Loss of interest in usual pleasures, Guilt, Tearfulness, Isolating, Fatigue, Feeling worthless/self pity, Feeling angry/irritable Substance abuse history and/or treatment for substance abuse?: Yes Suicide prevention information given to non-admitted patients: Not applicable  Risk to Others within the past 6 months Homicidal Ideation: No(Pt denies. ) Does patient have any lifetime risk of violence toward others beyond the six months prior to admission? : No Thoughts of Harm to Others: No Current Homicidal Intent: No Current Homicidal Plan: No Access to Homicidal Means: No Identified Victim: NA History of harm to others?: No Assessment of Violence: None Noted Violent Behavior Description: NA Does patient have access to weapons?: Yes (Comment)(Pt reported, access to tweo guns (.38  anda ruger) and kitch) Does patient have a court date: No Is patient on probation?: No  Psychosis Hallucinations: None noted(Pt denies. ) Delusions: None noted(Pt denies. )  Mental Status Report Appearance/Hygiene: In scrubs Eye Contact: Fair Motor Activity: Unremarkable Speech: Logical/coherent Level of Consciousness: Quiet/awake Mood: Depressed Affect: Appropriate to circumstance Anxiety Level: None Thought Processes: Relevant, Coherent Judgement: Partial Orientation: Time, Situation, Place, Person Obsessive Compulsive Thoughts/Behaviors: None  Cognitive Functioning Concentration: Normal Memory: Recent Intact IQ: Average Insight: Fair Impulse Control: Good Appetite: Good Sleep: Increased Total Hours of Sleep: (Pt reported, "all the time." ) Vegetative Symptoms: Staying in bed, Not bathing, Decreased  grooming  ADLScreening [facility] Assessment Services) Patient's cognitive ability adequate to safely complete daily activities?: Yes Patient able to express need for assistance with ADLs?: Yes Independently performs ADLs?: Yes (appropriate for developmental age)  Prior Inpatient Therapy Prior Inpatient Therapy: Yes Prior Therapy Dates: 79. Prior Therapy Facilty/Provider(s): Pt reported, a facility in [place], [place] Reason for Treatment: Suicide attempt.   Prior Outpatient Therapy Prior Outpatient Therapy: No Prior Therapy Dates: NA Prior Therapy Facilty/Provider(s): NA Reason for Treatment: NA Does patient have an ACCT team?: No Does patient have Intensive In-House Services?  : No Does patient have [facility] services? : No Does patient have P4CC services?: No  ADL Screening (condition at time of admission) Patient's cognitive ability adequate to safely complete daily activities?: Yes Is the patient deaf or have difficulty hearing?: No Does the patient have difficulty seeing, even when wearing glasses/contacts?: Yes(Pt reported, wearing glasses. ) Does the patient have difficulty concentrating, remembering, or making decisions?: Yes Patient able to express need for assistance with ADLs?: Yes Does the patient have difficulty dressing or bathing?: No Independently performs ADLs?: Yes (appropriate for developmental age) Does the patient have difficulty walking or climbing stairs?: No Weakness of Legs: Both(Pt reported, burning in his lower legs. ) Weakness of Arms/Hands: Both(Pt reported, his hands feel like needles are sticking them. )  Home Assistive Devices/Equipment Home Assistive Devices/Equipment: None    Abuse/Neglect Assessment (Assessment to be complete while patient is alone) Abuse/Neglect Assessment Can Be Completed: Yes Physical Abuse: Denies(Pt denies. ) Verbal Abuse: Denies(Pt denies. ) Sexual Abuse: Denies(Pt denies. ) Exploitation of patient/patient's resources:  Denies(Pt denies. ) Self-Neglect: Denies(Pt denies. )     Advance Directives (For Healthcare) Does Patient Have a Medical Advance Directive?: No Would patient like information on creating a medical advance directive?: No - Patient declined    Additional Information 1:1 In Past 12 Months?: No CIRT Risk: No Elopement Risk: No Does patient have medical clearance?: Yes     Disposition: Dr. Leonides Schanz recommends inpatient treatment. Disposition discussed with Vicente Males, RN. TTS to seek placement.   Disposition Initial Assessment Completed for this Encounter: Yes Disposition of Patient: Inpatient treatment program Type of inpatient treatment program: Adult  This service was provided via telemedicine using a 2-way, interactive audio and video technology.  Names of all persons participating in this telemedicine service and their role in this encounter.               Vertell Novak 06/22/2017 4:38 AM   Vertell Novak, MS, [facility], [facility] Triage Specialist [phone]

## 2017-06-22 NOTE — Progress Notes (Signed)
D: Pt denies HI/AVH, passive SI-contracts for safety. Pt is pleasant and cooperative. Pt stated he was feeling a little better in the past 20 days  A: Pt was offered support and encouragement. Pt was given scheduled medications. Pt was encourage to attend groups. Q 15 minute checks were done for safety.   R:Pt  interacts well with peers and staff. Pt is taking medication. Pt has no complaints.Pt receptive to treatment and safety maintained on unit.

## 2017-06-22 NOTE — ED Notes (Signed)
All Belongings placed in bag and sent with patient to [facility]. Backpack, boots, two shirts, 2 pair of socks, two underwear, one jacket, one sweater, two hats, two pairs of gloves, pen, chapstick, sweatpants, three glasses, 1 book, and one set of keys. Along with valuables from the safe - Watch, wallet, black ring, [employer] [place] phone, and [employer] black phone.

## 2017-06-22 NOTE — BHH Suicide Risk Assessment (Signed)
[facility] Admission Suicide Risk Assessment   Nursing information obtained from:   patient and chart  Demographic factors:   56 year old widowed male, two adult children, lives alone  Current Mental Status:   see below  Loss Factors:   death of loved ones, disability, recent relapse Historical Factors:   depression, history of alcohol use disorder  Risk Reduction Factors:   resilience   Total Time spent with patient: 45 minutes Principal Problem:  MDD, no psychotic features, Alcohol Dependence Diagnosis:   Patient Active Problem List   Diagnosis Date Noted  . Essential hypertension [I10] 01/13/2016  . Coronary artery disease involving native coronary artery of native heart without angina pectoris [I25.10] 01/13/2016  . Unstable angina ([place]) [I20.0]   . ED (erectile dysfunction) [N52.9] 02/15/2014  . Allergy to contrast media (used for diagnostic x-rays) [Z91.041] 05/12/2011  . Hypertension [I10] 05/09/2011  . Hyperlipidemia [E78.5] 05/09/2011  . DM2 (diabetes mellitus, type 2) ([place]) [E11.9] 07/10/2010  . CAD S/P percutaneous coronary angioplasty [I25.10, Z98.61] 07/10/2010  . DYSPEPSIA&OTHER SPEC DISORDERS FUNCTION STOMACH [K31.89, R10.13] 07/10/2010    Continued Clinical Symptoms:    The "Alcohol Use Disorders Identification Test", Guidelines for Use in Primary Care, Second Edition.  World Pharmacologist [facility]). Score between 0-7:  no or low risk or alcohol related problems. Score between 8-15:  moderate risk of alcohol related problems. Score between 16-19:  high risk of alcohol related problems. Score 20 or above:  warrants further diagnostic evaluation for alcohol dependence and treatment.   CLINICAL FACTORS:  56 year old male, presented to ED due to worsening depression and suicidal ideations. Relapsed to alcohol binges several weeks ago, after a period of years of sobriety. He reports loss of loved ones ( wife and mother died earlier this year) as a contributor to his  depression.  Reports history of good response to Remeron in the past .  Psychiatric Specialty Exam: Physical Exam  ROS  There were no vitals taken for this visit.There is no height or weight on file to calculate BMI.   see admit note MSE     COGNITIVE FEATURES THAT CONTRIBUTE TO RISK:  Closed-mindedness and Loss of executive function    SUICIDE RISK:   Moderate:  Frequent suicidal ideation with limited intensity, and duration, some specificity in terms of plans, no associated intent, good self-control, limited dysphoria/symptomatology, some risk factors present, and identifiable protective factors, including available and accessible social support.  PLAN OF CARE: Patient will be admitted to inpatient psychiatric unit for stabilization and safety. Will provide and encourage milieu participation. Provide medication management and maked adjustments as needed.  Will follow daily.    I certify that inpatient services furnished can reasonably be expected to improve the patient's condition.   Jenne Campus, MD 06/22/2017, 2:22 PM

## 2017-06-22 NOTE — ED Notes (Signed)
[facility] called for transportation.  

## 2017-06-22 NOTE — ED Notes (Signed)
TTS being done

## 2017-06-22 NOTE — Progress Notes (Signed)
[name]  Is a 56 yo male admitted to [facility] ( for the first time) voluntarily after going to [facility] ED with c/o hopelessness, suicidality and sever depression. He reports he is on disability, he lost his wife to breast cancer 2 years ago and most recently ( " about 1 month ago) he lost his mother. He says eve rsince then, his depression has worsened daily, to the point that he no longer is cleaning,his home and is " paralyzed by my depression". He reports his PMH sign for diabetes ( non insulin dependent), diagnosed 8 yrs ago, MI 8-9 yrs ago, HTN, hypercholestereimia, and DJD. He states he has been " off my meds for a long time now" and his main goal in coming to [facility], he says, is to " get back on my med". He contracts for safety, is oriented to the unit and admission complete.

## 2017-06-22 NOTE — Plan of Care (Signed)
  Coping: Ability to cope will improve 06/22/2017 2047 - Progressing by Providence Crosby, RN Note Pt passive SI - contracts for safety   Safety: Periods of time without injury will increase 06/22/2017 2047 - Progressing by Providence Crosby, RN Note Pt safe on the unit at this time

## 2017-06-22 NOTE — Tx Team (Signed)
Initial Treatment Plan 06/22/2017 6:06 PM [name] JOI:[id number]    PATIENT STRESSORS: Educational concerns   PATIENT STRENGTHS: Ability for insight Active sense of humor Average or above average intelligence   PATIENT IDENTIFIED PROBLEMS: MDD  SI            " I don't want to die"  " I wish I could just snap out of it"     DISCHARGE CRITERIA:  Ability to meet basic life and health needs Adequate post-discharge living arrangements  PRELIMINARY DISCHARGE PLAN: Attend aftercare/continuing care group Attend PHP/IOP Attend 12-step recovery group  PATIENT/FAMILY INVOLVEMENT: This treatment plan has been presented to and reviewed with the patient, [name], and/or family member, .  The patient and family have been given the opportunity to ask questions and make suggestions.  Lauralyn Primes, RN 06/22/2017, 6:06 PM

## 2017-06-22 NOTE — BHH Counselor (Signed)
Clinician spoke to [place], RN and expressed she wanted to complete pt's TTS consult. Clinician expressed since pt is in the hallway a empty room is needed to complete the TTS consult. Clinician noted there may not be any empty rooms available. Clinician provided her name and number for follow up as the RN is in with another pt.    Vertell Novak, MS, [facility], [facility] Triage Specialist [phone]

## 2017-06-22 NOTE — H&P (Signed)
Psychiatric Admission Assessment Adult  Patient Identification: [name] MRN:  [id number] Date of Evaluation:  06/22/2017 Chief Complaint:  " depression" Principal Diagnosis:  MDD, severe, no psychotic symptoms Diagnosis:   Patient Active Problem List   Diagnosis Date Noted  . Essential hypertension [I10] 01/13/2016  . Coronary artery disease involving native coronary artery of native heart without angina pectoris [I25.10] 01/13/2016  . Unstable angina ([place]) [I20.0]   . ED (erectile dysfunction) [N52.9] 02/15/2014  . Allergy to contrast media (used for diagnostic x-rays) [Z91.041] 05/12/2011  . Hypertension [I10] 05/09/2011  . Hyperlipidemia [E78.5] 05/09/2011  . DM2 (diabetes mellitus, type 2) ([place]) [E11.9] 07/10/2010  . CAD S/P percutaneous coronary angioplasty [I25.10, Z98.61] 07/10/2010  . DYSPEPSIA&OTHER SPEC DISORDERS FUNCTION STOMACH [K31.89, R10.13] 07/10/2010   History of Present Illness: Patient is 56 year old male, presented to ED voluntarily yesterday due to worsening depression , with passive suicidal ideations , and reporting he has not been taking proper care of self or taking his medications regularly, because " I did not care anymore ".  Explains he has history of CAD/MI and for a long time had been watching his diet regularly, going to the gym several times a week, but that " the last few weeks I have been eating junk food again".  He attributes depression to loss of loved ones- states his wife died earlier this year , and that his mother passed away more recently .  Endorses neuro-vegetative symptoms as below, and states he has become more isolative and less sociable . Denies psychotic symptoms.  Patient reports history of alcohol use disorder, and states he relapsed on ETOH after a period of 4 years of sobriety. He relapsed about three weeks ago. He has been drinking in binges , 2 times per week. States that on drinking days has been drinking up to a bottle of  liquor. Last drank 3-4 days ago. Admission BAL < 10, Admission UDS negative .  Associated Signs/Symptoms: Depression Symptoms:  depressed mood, anhedonia, hypersomnia, suicidal thoughts without plan, loss of energy/fatigue, decreased appetite, (Hypo) Manic Symptoms:  Denies  Anxiety Symptoms:  Denies  Psychotic Symptoms:  Denies  PTSD Symptoms: Does not endorse  Total Time spent with patient: 45 minutes  Past Psychiatric History: he has had one prior psychiatric admission in 1993 .  Reports history of depression, denies history of psychosis, denies history of mania, denies history of violence, describes some agoraphobia, but does not endorse panic attacks.  History of prior suicide attempt in 48 by driving off road, and in 05 by overdosing   Is the patient at risk to self? Yes.    Has the patient been a risk to self in the past 6 months? Yes.    Has the patient been a risk to self within the distant past? Yes.    Is the patient a risk to others? No.  Has the patient been a risk to others in the past 6 months? No.  Has the patient been a risk to others within the distant past? No.   Prior Inpatient Therapy:  as above  Prior Outpatient Therapy:  not recently   Alcohol Screening:   Substance Abuse History in the last 12 months: denies current alcohol use disorder, states he has a history of alcohol abuse but has been sober x 4 years. Denies drug abuse .  Consequences of Substance Abuse: History of DUI, history of blackouts, denies history of seizures. As above, states he has been sober x  4 years at this time . Previous Psychotropic Medications: States that he has not been on any psychiatric medication for several years, remembers having been on Prozac , Remeron, Zoloft in the past. States Remeron worked the best .  Pensions consultant:  No  Past Medical History:  Past Medical History:  Diagnosis Date  . Arthritis   . Chronic lower back pain   . Coronary artery disease     a. Multiple prior caths/PCI. Cath 2013 with possible spasm of RCA, 70% ISR of mid LCx with subsequent DES to mLCx and prox LCX. b. H/o microvascular angina. c. Recurrent angina 08/2014 - s/p PTCA/DES to prox Cx, PTCA/CBA to OM1.  c. LHC 06/10/15 with patent stents and some ISR in LCX and OM-1 that was not flow limiting --> Rx   . Dyslipidemia    a. Intolerant to many statins except tolerating Livalo.  Marland Kitchen GERD (gastroesophageal reflux disease)   . Hypertension   . Myocardial infarction ([place]) ~ 2010  . S/P angioplasty with stent, DES, to proximal and mid LCX 12/15/11 12/15/2011  . Type II diabetes mellitus ([place])     Past Surgical History:  Procedure Laterality Date  . CARDIAC CATHETERIZATION  06/15/2002   LAD with prox 40% stenosis, norma L main, Cfx with 25% lesion, RCA with long mid 25% stenosis (Dr. Vita Barley)  . CARDIAC CATHETERIZATION  04/01/2010   normal L main, LAD wit mild stenosis, L Cfx with 70% in-stent restenosis, RCA with 70% in-stent restenosis, LVEF >60% (Dr. K. Mali Hilty) - cutting ballon arthrectomy to RCA & Cfx (Dr. Rockne Menghini)  . CARDIAC CATHETERIZATION  08/25/2010   preserved global LV contractility; multivessel CAD, diffuse 90-95% in-stent restenosis in prox placed Cfx stent - cutting balloon arthrectomy in Cfx with multiple dilatations 90-95% to 0% stenosis (Dr. Corky Downs)  . CARDIAC CATHETERIZATION  01/26/2011   PCI & stenting of aggresive in-stent restenosis within previously stented AV groove Cfx with 3.0x30m Taxus DES (previous stents were Promus) (Dr. JAdora Fridge  . CARDIAC CATHETERIZATION  05/11/2011   preserved LV function, 40% mid LAD stenosis, 30-40% narrowing proximal to stented semgnet of prox Cfx, patent mid RCA stent with smooth 20% narrowing in distal RCA (Dr. TCorky Downs  . CARDIAC CATHETERIZATION  12/15/2011   PCI & stenting of proximal & mid Cfx with DES - 3.0x180min proximal, 3.0x1549mn mid (Dr. J. Adora Fridge. CARDIAC CATHETERIZATION N/A 06/10/2015   Procedure:  Left Heart Cath and Coronary Angiography;  Surgeon: DanJolaine ArtistD;  Location: [facility];  Service: Cardiovascular;  Laterality: N/A;  . cardiometabolic testing  08/09/30/4401good exercise effort, peak VO2 79% predicted with normal VO2 HR curves (mild deconditioning)  . EXCISIONAL HEMORRHOIDECTOMY  1984  . LEFT HEART CATHETERIZATION WITH CORONARY ANGIOGRAM N/A 05/11/2011   Procedure: LEFT HEART CATHETERIZATION WITH CORONARY ANGIOGRAM;  Surgeon: ThoTroy SineD;  [facility];  Service: Cardiovascular;  Laterality: N/A;  Possible percutaneous coronary intervention, possible IVUS  . LEFT HEART CATHETERIZATION WITH CORONARY ANGIOGRAM N/A 12/15/2011   Procedure: LEFT HEART CATHETERIZATION WITH CORONARY ANGIOGRAM;  Surgeon: JonLorretta HarpD;  [facility];  Service: Cardiovascular;  Laterality: N/A;  . LEFT HEART CATHETERIZATION WITH CORONARY ANGIOGRAM N/A 09/05/2014   Procedure: LEFT HEART CATHETERIZATION WITH CORONARY ANGIOGRAM;  Surgeon: ChrBurnell BlanksD;  [facility];  Service: Cardiovascular;  Laterality: N/A;  . LIPOMA EXCISION     back of the head  .  NM MYOCAR PERF WALL MOTION  02/2012   lexiscan myoview; mild perfusion defect in mid inferolateral & basal inferolateral region (infarct/scar); EF 52%, abnormal but ow risk scan  . PERCUTANEOUS CORONARY STENT INTERVENTION (PCI-S)  09/05/2014   Procedure: PERCUTANEOUS CORONARY STENT INTERVENTION (PCI-S);  Surgeon: Burnell Blanks, MD;  Location: [facility];  Service: Cardiovascular;;   Family History: mother passed away earlier this year from brain cancer, father is deceased from prostate cancer, has three brothers, two sisters  Family History  Problem Relation Age of Onset  . Leukemia Mother   . Prostate cancer Father   . Coronary artery disease Paternal Grandmother   . Cancer Paternal Grandfather   . Cancer Brother    Family Psychiatric  History: history of alcohol dependence in  family ( uncles). Reports grandmother and an uncle committed suicide  Tobacco Screening:  reports he restarted smoking cigarettes about 2 weeks ago Social History: 56 year old male, widowed- wife passed away earlier this year, has two adult children, on disability, lives alone . Social History   Substance and Sexual Activity  Alcohol Use No   Comment: 12/15/11 "til 2010, drank a pint of gin/day at least"     Social History   Substance and Sexual Activity  Drug Use No   Comment: 12/15/11 "last cocaine was 2010"    Additional Social History:  Allergies:   Allergies  Allergen Reactions  . Bee Venom Anaphylaxis and Hives  . Shellfish Allergy Anaphylaxis and Hives  . Statins Other (See Comments)    Myalgias. Tolerating livalo.   . Testosterone Cypionate     Testerone Injection --Increased breast tissue    Lab Results:  Results for orders placed or performed during the hospital encounter of 06/22/17 (from the past 48 hour(s))  Comprehensive metabolic panel     Status: None   Collection Time: 06/21/17  7:04 PM  Result Value Ref Range   Sodium 137 135 - 145 mmol/L   Potassium 4.1 3.5 - 5.1 mmol/L   Chloride 104 101 - 111 mmol/L   CO2 25 22 - 32 mmol/L   Glucose, Bld 86 65 - 99 mg/dL   BUN 15 6 - 20 mg/dL   Creatinine, Ser 0.92 0.61 - 1.24 mg/dL   Calcium 9.4 8.9 - 10.3 mg/dL   Total Protein 6.7 6.5 - 8.1 g/dL   Albumin 4.1 3.5 - 5.0 g/dL   AST 36 15 - 41 U/L   ALT 48 17 - 63 U/L   Alkaline Phosphatase 54 38 - 126 U/L   Total Bilirubin 0.3 0.3 - 1.2 mg/dL   GFR calc non Af Amer >60 >60 mL/min   GFR calc Af Amer >60 >60 mL/min    Comment: (NOTE) The eGFR has been calculated using the CKD EPI equation. This calculation has not been validated in all clinical situations. eGFR's persistently <60 mL/min signify possible Chronic Kidney Disease.    Anion gap 8 5 - 15  Ethanol     Status: None   Collection Time: 06/21/17  7:04 PM  Result Value Ref Range   Alcohol, Ethyl (B) <10  <10 mg/dL    Comment:        LOWEST DETECTABLE LIMIT FOR SERUM ALCOHOL IS 10 mg/dL FOR MEDICAL PURPOSES ONLY   Salicylate level     Status: None   Collection Time: 06/21/17  7:04 PM  Result Value Ref Range   Salicylate Lvl <3.5 2.8 - 30.0 mg/dL  Acetaminophen level  Status: Abnormal   Collection Time: 06/21/17  7:04 PM  Result Value Ref Range   Acetaminophen (Tylenol), Serum <10 (L) 10 - 30 ug/mL    Comment:        THERAPEUTIC CONCENTRATIONS VARY SIGNIFICANTLY. A RANGE OF 10-30 ug/mL MAY BE AN EFFECTIVE CONCENTRATION FOR MANY PATIENTS. HOWEVER, SOME ARE BEST TREATED AT CONCENTRATIONS OUTSIDE THIS RANGE. ACETAMINOPHEN CONCENTRATIONS >150 ug/mL AT 4 HOURS AFTER INGESTION AND >50 ug/mL AT 12 HOURS AFTER INGESTION ARE OFTEN ASSOCIATED WITH TOXIC REACTIONS.   cbc     Status: None   Collection Time: 06/21/17  7:04 PM  Result Value Ref Range   WBC 6.9 4.0 - 10.5 K/uL   RBC 4.85 4.22 - 5.81 MIL/uL   Hemoglobin 13.9 13.0 - 17.0 g/dL   HCT 42.1 39.0 - 52.0 %   MCV 86.8 78.0 - 100.0 fL   MCH 28.7 26.0 - 34.0 pg   MCHC 33.0 30.0 - 36.0 g/dL   RDW 12.5 11.5 - 15.5 %   Platelets 261 150 - 400 K/uL  Rapid urine drug screen (hospital performed)     Status: None   Collection Time: 06/21/17  9:31 PM  Result Value Ref Range   Opiates NONE DETECTED NONE DETECTED   Cocaine NONE DETECTED NONE DETECTED   Benzodiazepines NONE DETECTED NONE DETECTED   Amphetamines NONE DETECTED NONE DETECTED   Tetrahydrocannabinol NONE DETECTED NONE DETECTED   Barbiturates NONE DETECTED NONE DETECTED    Comment: (NOTE) DRUG SCREEN FOR MEDICAL PURPOSES ONLY.  IF CONFIRMATION IS NEEDED FOR ANY PURPOSE, NOTIFY LAB WITHIN 5 DAYS. LOWEST DETECTABLE LIMITS FOR URINE DRUG SCREEN Drug Class                     Cutoff (ng/mL) Amphetamine and metabolites    1000 Barbiturate and metabolites    200 Benzodiazepine                 130 Tricyclics and metabolites     300 Opiates and metabolites         300 Cocaine and metabolites        300 THC                            50     Blood Alcohol level:  Lab Results  Component Value Date   ETH <10 06/21/2017   ETH <5 86/57/8469    Metabolic Disorder Labs:  Lab Results  Component Value Date   HGBA1C 5.4 12/11/2016   MPG 131 06/09/2015   MPG 131 (H) 12/15/2011   No results found for: PROLACTIN Lab Results  Component Value Date   CHOL 195 12/08/2016   TRIG 87 12/08/2016   HDL 46 12/08/2016   CHOLHDL 4.2 12/08/2016   VLDL 17 12/08/2016   LDLCALC 132 (H) 12/08/2016   LDLCALC 66 06/10/2015    Current Medications: Current Facility-Administered Medications  Medication Dose Route Frequency Provider Last Rate Last Dose  . aspirin EC tablet 81 mg  81 mg Oral Daily Laverle Hobby, PA-C      . carvedilol (COREG) tablet 25 mg  25 mg Oral Daily Simon, Spencer E, PA-C      . gabapentin (NEURONTIN) capsule 600 mg  600 mg Oral QHS Simon, Spencer E, PA-C      . lisinopril (PRINIVIL,ZESTRIL) tablet 5 mg  5 mg Oral Daily Simon, Spencer E, PA-C      . [START ON  06/23/2017] metFORMIN (GLUCOPHAGE-XR) 24 hr tablet 500 mg  500 mg Oral Q breakfast Laverle Hobby, PA-C      . naproxen (NAPROSYN) tablet 375 mg  375 mg Oral BID Patriciaann Clan E, PA-C      . nitroGLYCERIN (NITROSTAT) SL tablet 0.4 mg  0.4 mg Sublingual Q5 min PRN Laverle Hobby, PA-C       PTA Medications: Medications Prior to Admission  Medication Sig Dispense Refill Last Dose  . aspirin EC 81 MG tablet Take 1 tablet (81 mg total) by mouth daily. 90 tablet 3 Past Month at Unknown time  . carvedilol (COREG) 12.5 MG tablet Take 2 tablets (25 mg total) by mouth daily. 90 tablet 1 Past Month at Unknown time  . Coenzyme Q10 (CO Q 10 PO) Take 1 capsule by mouth at bedtime.   Past Month at Unknown time  . cyclobenzaprine (FLEXERIL) 10 MG tablet Take 1 tablet (10 mg total) by mouth 3 (three) times daily as needed for muscle spasms. 30 tablet 0 Past Month at Unknown time  . lisinopril  (PRINIVIL,ZESTRIL) 5 MG tablet Take 1 tablet (5 mg total) by mouth daily. 90 tablet 2 Past Month at Unknown time  . metFORMIN (GLUCOPHAGE-XR) 500 MG 24 hr tablet Take 1 tablet (500 mg total) by mouth daily with breakfast. 90 tablet 1 Past Month at Unknown time  . naproxen (NAPROSYN) 375 MG tablet Take 1 tablet (375 mg total) by mouth 2 (two) times daily. 20 tablet 0 Past Month at Unknown time  . nitroGLYCERIN (NITROSTAT) 0.4 MG SL tablet Place 1 tablet (0.4 mg total) under the tongue every 5 (five) minutes as needed. For chest pain 25 tablet 3 Past Month at Unknown time  . Omega-3 Fatty Acids (FISH OIL) 1200 MG CAPS Take 1 capsule (1,200 mg total) by mouth daily. 90 capsule 3 Past Month at Unknown time  . Pitavastatin Calcium (LIVALO) 4 MG TABS Take 1 tablet (4 mg total) by mouth daily. 90 tablet 3 Past Month at Unknown time  . sildenafil (REVATIO) 20 MG tablet Take 2 to 3 tablets as needed for sexual activity 100 tablet 0 Past Month at Unknown time  . Testosterone Cypionate 200 MG/ML KIT Inject 0.8 mLs into the muscle once a week.   Past Month at Unknown time    Musculoskeletal: Strength & Muscle Tone: within normal limits Gait & Station: normal Patient leans: N/A  Psychiatric Specialty Exam: Physical Exam  Review of Systems  Constitutional: Negative.   HENT: Negative.   Eyes: Negative.   Respiratory: Negative.   Cardiovascular: Negative.   Gastrointestinal: Negative.  Negative for blood in stool, heartburn, nausea and vomiting.  Genitourinary: Negative.   Musculoskeletal: Positive for back pain.       Reports chronic lower back pain   Skin: Negative.   Neurological: Negative for seizures.  Endo/Heme/Allergies: Negative.   Psychiatric/Behavioral: Positive for depression and substance abuse.  All other systems reviewed and are negative.   There were no vitals taken for this visit.There is no height or weight on file to calculate BMI.  General Appearance: Fairly Groomed  Eye  Contact:  Fair  Speech:  Normal Rate  Volume:  Decreased  Mood:  Depressed  Affect:  sad, constricted   Thought Process:  Linear and Descriptions of Associations: Intact  Orientation:  Other:  fully alert and attentive   Thought Content:  denies hallucinations, no delusions, not internally preoccupied   Suicidal Thoughts:  No denies any suicidal or self  injurious ideations, and contracts for safety on unit  Homicidal Thoughts:  No  Memory:  recent and remote grossly intact   Judgement:  Fair  Insight:  Fair  Psychomotor Activity:  Decreased no tremors, no diaphoresis, no restlessness or agitation  Concentration:  Concentration: Good and Attention Span: Good  Recall:  Good  Fund of Knowledge:  Good  Language:  Good  Akathisia:  Negative  Handed:  Right  AIMS (if indicated):     Assets:  Communication Skills Desire for Improvement Resilience  ADL's:  Intact  Cognition:  WNL  Sleep:       Treatment Plan Summary: Daily contact with patient to assess and evaluate symptoms and progress in treatment, Medication management, Plan inpatient treatment  and medications as below  Observation Level/Precautions:  15 minute checks  Laboratory:  as needed   Psychotherapy:  Milieu, group therapy   Medications:  We discussed medication options, patient states Remeron was helpful and well tolerated in the past. Start Remeron 7.5 mgrs QHS  He was started on Neurontin for pain, anxiety- new medication trial for patient, will adjust starting dose to 100 mgrs TID, titrate gradually as tolerated   Consultations:  As needed   Discharge Concerns:  -  Estimated LOS: 5 days   Other:     Physician Treatment Plan for Primary Diagnosis:  Major Depression, no Psychotic Features  Long Term Goal(s): Improvement in symptoms so as ready for discharge  Short Term Goals: Ability to identify changes in lifestyle to reduce recurrence of condition will improve, Ability to maintain clinical measurements within  normal limits will improve and Compliance with prescribed medications will improve  Physician Treatment Plan for Secondary Diagnosis: Alcohol Use Disorder  Long Term Goal(s): Improvement in symptoms so as ready for discharge  Short Term Goals: Ability to identify triggers associated with substance abuse/mental health issues will improve  I certify that inpatient services furnished can reasonably be expected to improve the patient's condition.    Jenne Campus, MD 12/25/20181:54 PM

## 2017-06-22 NOTE — ED Notes (Signed)
Behavioral health called about TTS eval. Message left.

## 2017-06-23 DIAGNOSIS — F39 Unspecified mood [affective] disorder: Secondary | ICD-10-CM

## 2017-06-23 DIAGNOSIS — F419 Anxiety disorder, unspecified: Secondary | ICD-10-CM

## 2017-06-23 DIAGNOSIS — Z87891 Personal history of nicotine dependence: Secondary | ICD-10-CM

## 2017-06-23 DIAGNOSIS — F322 Major depressive disorder, single episode, severe without psychotic features: Principal | ICD-10-CM

## 2017-06-23 DIAGNOSIS — R450 Nervousness: Secondary | ICD-10-CM

## 2017-06-23 LAB — GLUCOSE, CAPILLARY: Glucose-Capillary: 156 mg/dL — ABNORMAL HIGH (ref 65–99)

## 2017-06-23 MED ORDER — BISACODYL 5 MG PO TBEC
5.0000 mg | DELAYED_RELEASE_TABLET | Freq: Every day | ORAL | Status: DC | PRN
  Administered 2017-06-23 – 2017-06-25 (×2): 5 mg via ORAL
  Filled 2017-06-23 (×2): qty 1

## 2017-06-23 MED ORDER — POLYETHYLENE GLYCOL 3350 17 G PO PACK
17.0000 g | PACK | Freq: Every day | ORAL | Status: DC | PRN
  Administered 2017-06-23 – 2017-06-26 (×4): 17 g via ORAL
  Filled 2017-06-23 (×4): qty 1

## 2017-06-23 MED ORDER — VENLAFAXINE HCL ER 37.5 MG PO CP24
37.5000 mg | ORAL_CAPSULE | Freq: Every day | ORAL | Status: DC
  Administered 2017-06-23 – 2017-06-24 (×2): 37.5 mg via ORAL
  Filled 2017-06-23 (×4): qty 1

## 2017-06-23 NOTE — Progress Notes (Signed)
Recreation Therapy Notes  Date: 06/23/17 Time: 0930 Location: [address]  Group Topic: Stress Management  Goal Area(s) Addresses:  Patient will verbalize importance of using healthy stress management.  Patient will identify positive emotions associated with healthy stress management.   Intervention: Stress Management  Activity :  Meditation.  LRT read a meditation to help patients visualize the strength and endurance mountains have and how they can use that same strength to get through the things they are faced with.  Education:  Stress Management, Discharge Planning.   Education Outcome: Acknowledges edcuation/In group clarification offered/Needs additional education  Clinical Observations/Feedback: Pt did not attend group.    [name], LRT/CTRS         Ria Comment, Donterrius Santucci A 06/23/2017 11:22 AM

## 2017-06-23 NOTE — [facility] Suicide Risk Assessment (Signed)
[place] INPATIENT:  Family/Significant Other Suicide Prevention Education  Suicide Prevention Education:  Patient Refusal for Family/Significant Other Suicide Prevention Education: The patient [name] has refused to provide written consent for family/significant other to be provided Family/Significant Other Suicide Prevention Education during admission and/or prior to discharge.  Physician notified.  Joanne Chars, LCSW 06/23/2017, 1:40 PM

## 2017-06-23 NOTE — Progress Notes (Signed)
[facility] MD Progress Note  06/23/2017 2:03 PM [name]  MRN:  [id number]   Subjective:  Patient reports that he feels physically better today, but mentally about the same. He reports his depression at 8/10. He reports that he has been sleeping too much, but only wants to continue the Remeron because it worked in the past. He agrees to add Effexor-XR to the regimen. He denies any SI/HI/AVH and contracts for safety. He does report feeling that he just doesn't need to care for his medical problems anymore. He reports that he has some anxiety in crowds and has stayed away from the day room due to the excessive people staying in there.   Objective: Patient's chart and findings reviewed and discussed with treatment team. Patient is pleasant and cooperative. Will start Effexor-XR 37.5 mg Daily and will continue Remeron 7.5 mg QHS. Patient has stayed in his room a lot.   Principal Problem: MDD (major depressive disorder), severe ([place]) Diagnosis:   Patient Active Problem List   Diagnosis Date Noted  . MDD (major depressive disorder), severe ([place]) [F32.2] 06/23/2017  . Essential hypertension [I10] 01/13/2016  . Coronary artery disease involving native coronary artery of native heart without angina pectoris [I25.10] 01/13/2016  . Unstable angina ([place]) [I20.0]   . ED (erectile dysfunction) [N52.9] 02/15/2014  . Allergy to contrast media (used for diagnostic x-rays) [Z91.041] 05/12/2011  . Hypertension [I10] 05/09/2011  . Hyperlipidemia [E78.5] 05/09/2011  . DM2 (diabetes mellitus, type 2) ([place]) [E11.9] 07/10/2010  . CAD S/P percutaneous coronary angioplasty [I25.10, Z98.61] 07/10/2010  . DYSPEPSIA&OTHER [facility] DISORDERS FUNCTION STOMACH [K31.89, R10.13] 07/10/2010   Total Time spent with patient: 25 minutes  Past Psychiatric History: See H&P  Past Medical History:  Past Medical History:  Diagnosis Date  . Arthritis   . Chronic lower back pain   . Coronary artery disease    a. Multiple prior  caths/PCI. Cath 2013 with possible spasm of RCA, 70% ISR of mid LCx with subsequent DES to mLCx and prox LCX. b. H/o microvascular angina. c. Recurrent angina 08/2014 - s/p PTCA/DES to prox Cx, PTCA/CBA to OM1.  c. LHC 06/10/15 with patent stents and some ISR in LCX and OM-1 that was not flow limiting --> Rx   . Dyslipidemia    a. Intolerant to many statins except tolerating Livalo.  Marland Kitchen GERD (gastroesophageal reflux disease)   . Hypertension   . Myocardial infarction ([place]) ~ 2010  . S/P angioplasty with stent, DES, to proximal and mid LCX 12/15/11 12/15/2011  . Type II diabetes mellitus ([place])     Past Surgical History:  Procedure Laterality Date  . CARDIAC CATHETERIZATION  06/15/2002   LAD with prox 40% stenosis, norma L main, Cfx with 25% lesion, RCA with long mid 25% stenosis (Dr. Vita Barley)  . CARDIAC CATHETERIZATION  04/01/2010   normal L main, LAD wit mild stenosis, L Cfx with 70% in-stent restenosis, RCA with 70% in-stent restenosis, LVEF >60% (Dr. K. Mali Hilty) - cutting ballon arthrectomy to RCA & Cfx (Dr. Rockne Menghini)  . CARDIAC CATHETERIZATION  08/25/2010   preserved global LV contractility; multivessel CAD, diffuse 90-95% in-stent restenosis in prox placed Cfx stent - cutting balloon arthrectomy in Cfx with multiple dilatations 90-95% to 0% stenosis (Dr. Corky Downs)  . CARDIAC CATHETERIZATION  01/26/2011   PCI & stenting of aggresive in-stent restenosis within previously stented AV groove Cfx with 3.0x38m Taxus DES (previous stents were Promus) (Dr. JAdora Fridge  . CARDIAC CATHETERIZATION  05/11/2011  preserved LV function, 40% mid LAD stenosis, 30-40% narrowing proximal to stented semgnet of prox Cfx, patent mid RCA stent with smooth 20% narrowing in distal RCA (Dr. Corky Downs)  . CARDIAC CATHETERIZATION  12/15/2011   PCI & stenting of proximal & mid Cfx with DES - 3.0x75m in proximal, 3.0x170min mid (Dr. J.Adora Fridge . CARDIAC CATHETERIZATION N/A 06/10/2015   Procedure: Left Heart Cath and  Coronary Angiography;  Surgeon: DaJolaine ArtistMD;  [facility];  Service: Cardiovascular;  Laterality: N/A;  . cardiometabolic testing  07/31/68/3500 good exercise effort, peak VO2 79% predicted with normal VO2 HR curves (mild deconditioning)  . EXCISIONAL HEMORRHOIDECTOMY  1984  . LEFT HEART CATHETERIZATION WITH CORONARY ANGIOGRAM N/A 05/11/2011   Procedure: LEFT HEART CATHETERIZATION WITH CORONARY ANGIOGRAM;  Surgeon: ThTroy SineMD;  [facility];  Service: Cardiovascular;  Laterality: N/A;  Possible percutaneous coronary intervention, possible IVUS  . LEFT HEART CATHETERIZATION WITH CORONARY ANGIOGRAM N/A 12/15/2011   Procedure: LEFT HEART CATHETERIZATION WITH CORONARY ANGIOGRAM;  Surgeon: JoLorretta HarpMD;  [facility];  Service: Cardiovascular;  Laterality: N/A;  . LEFT HEART CATHETERIZATION WITH CORONARY ANGIOGRAM N/A 09/05/2014   Procedure: LEFT HEART CATHETERIZATION WITH CORONARY ANGIOGRAM;  Surgeon: ChBurnell BlanksMD;  [facility];  Service: Cardiovascular;  Laterality: N/A;  . LIPOMA EXCISION     back of the head  . NM MYOCAR PERF WALL MOTION  02/2012   lexiscan myoview; mild perfusion defect in mid inferolateral & basal inferolateral region (infarct/scar); EF 52%, abnormal but ow risk scan  . PERCUTANEOUS CORONARY STENT INTERVENTION (PCI-S)  09/05/2014   Procedure: PERCUTANEOUS CORONARY STENT INTERVENTION (PCI-S);  Surgeon: ChBurnell BlanksMD;  [facility];  Service: Cardiovascular;;   Family History:  Family History  Problem Relation Age of Onset  . Leukemia Mother   . Prostate cancer Father   . Coronary artery disease Paternal Grandmother   . Cancer Paternal Grandfather   . Cancer Brother    Family Psychiatric  History: See H&P Social History:  Social History   Substance and Sexual Activity  Alcohol Use No   Comment: 12/15/11 "til 2010, drank a pint of gin/day at least"     Social History    Substance and Sexual Activity  Drug Use No   Comment: 12/15/11 "last cocaine was 2010"    Social History   Socioeconomic History  . Marital status: Divorced    Spouse name: None  . Number of children: 2  . Years of education: GED  . Highest education level: None  Social Needs  . Financial resource strain: None  . Food insecurity - worry: None  . Food insecurity - inability: None  . Transportation needs - medical: None  . Transportation needs - non-medical: None  Occupational History  . None  Tobacco Use  . Smoking status: Former Smoker    Packs/day: 1.00    Years: 10.00    Pack years: 10.00    Types: Cigarettes    Last attempt to quit: 04/07/2009    Years since quitting: 8.2  . Smokeless tobacco: Never Used  Substance and Sexual Activity  . 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 Drug use: No    Comment: 12/15/11 "last cocaine was 2010"  . Sexual activity: Not Currently  Other Topics Concern  . None  Social History Narrative  .  None   Additional Social History:    Pain Medications: See MAR Prescriptions: See MAR Over the Counter: See MAR Longest period of sobriety (when/how long): Pt reported, he was sober for four years until three weeks ago.  Negative Consequences of Use: Personal relationships Withdrawal Symptoms: Agitation, Cramps Name of Substance 1: Cigarettes.  1 - Age of First Use: UTA 1 - Amount (size/oz): Pt reported, he started back smoking, three weeks ago.  1 - Frequency: UTA 1 - Duration: UTA 1 - Last Use / Amount: UTA Name of Substance 2: Alcohol. 2 - Age of First Use: UTA 2 - Amount (size/oz): Pt reported, he started back drinking Gin, three weeks ago.  2 - Duration: UTA 2 - Last Use / Amount: Pt reported, three days ago.                 Sleep: Good  Appetite:  Good  Current Medications: Current Facility-Administered Medications  Medication Dose Route Frequency Provider Last Rate  Last Dose  . aspirin EC tablet 81 mg  81 mg Oral Daily Laverle Hobby, PA-C   81 mg at 06/23/17 6045  . bisacodyl (DULCOLAX) EC tablet 5 mg  5 mg Oral Daily PRN Money, Lowry Ram, FNP      . carvedilol (COREG) tablet 25 mg  25 mg Oral Daily Laverle Hobby, PA-C   25 mg at 06/23/17 4098  . gabapentin (NEURONTIN) capsule 100 mg  100 mg Oral TID Cobos, Myer Peer, MD   100 mg at 06/23/17 1208  . lisinopril (PRINIVIL,ZESTRIL) tablet 5 mg  5 mg Oral Daily Laverle Hobby, PA-C   5 mg at 06/23/17 1191  . metFORMIN (GLUCOPHAGE-XR) 24 hr tablet 500 mg  500 mg Oral Q breakfast Laverle Hobby, PA-C   500 mg at 06/23/17 4782  . mirtazapine (REMERON) tablet 7.5 mg  7.5 mg Oral QHS Cobos, Fernando A, MD   7.5 mg at 06/22/17 2128  . naproxen (NAPROSYN) tablet 375 mg  375 mg Oral BID PRN Cobos, Myer Peer, MD   375 mg at 06/23/17 1209  . nitroGLYCERIN (NITROSTAT) SL tablet 0.4 mg  0.4 mg Sublingual Q5 min PRN Patriciaann Clan E, PA-C      . polyethylene glycol (MIRALAX / GLYCOLAX) packet 17 g  17 g Oral Daily PRN Money, Lowry Ram, FNP      . venlafaxine XR (EFFEXOR-XR) 24 hr capsule 37.5 mg  37.5 mg Oral Daily Money, Lowry Ram, FNP        Lab Results:  Results for orders placed or performed during the hospital encounter of 06/22/17 (from the past 48 hour(s))  Comprehensive metabolic panel     Status: None   Collection Time: 06/21/17  7:04 PM  Result Value Ref Range   Sodium 137 135 - 145 mmol/L   Potassium 4.1 3.5 - 5.1 mmol/L   Chloride 104 101 - 111 mmol/L   CO2 25 22 - 32 mmol/L   Glucose, Bld 86 65 - 99 mg/dL   BUN 15 6 - 20 mg/dL   Creatinine, Ser 0.92 0.61 - 1.24 mg/dL   Calcium 9.4 8.9 - 10.3 mg/dL   Total Protein 6.7 6.5 - 8.1 g/dL   Albumin 4.1 3.5 - 5.0 g/dL   AST 36 15 - 41 U/L   ALT 48 17 - 63 U/L   Alkaline Phosphatase 54 38 - 126 U/L   Total Bilirubin 0.3 0.3 - 1.2 mg/dL   GFR calc non Af Amer >  60 >60 mL/min   GFR calc Af Amer >60 >60 mL/min    Comment: (NOTE) The eGFR has been  calculated using the CKD EPI equation. This calculation has not been validated in all clinical situations. eGFR's persistently <60 mL/min signify possible Chronic Kidney Disease.    Anion gap 8 5 - 15  Ethanol     Status: None   Collection Time: 06/21/17  7:04 PM  Result Value Ref Range   Alcohol, Ethyl (B) <10 <10 mg/dL    Comment:        LOWEST DETECTABLE LIMIT FOR SERUM ALCOHOL IS 10 mg/dL FOR MEDICAL PURPOSES ONLY   Salicylate level     Status: None   Collection Time: 06/21/17  7:04 PM  Result Value Ref Range   Salicylate Lvl <7.8 2.8 - 30.0 mg/dL  Acetaminophen level     Status: Abnormal   Collection Time: 06/21/17  7:04 PM  Result Value Ref Range   Acetaminophen (Tylenol), Serum <10 (L) 10 - 30 ug/mL    Comment:        THERAPEUTIC CONCENTRATIONS VARY SIGNIFICANTLY. A RANGE OF 10-30 ug/mL MAY BE AN EFFECTIVE CONCENTRATION FOR MANY PATIENTS. HOWEVER, SOME ARE BEST TREATED AT CONCENTRATIONS OUTSIDE THIS RANGE. ACETAMINOPHEN CONCENTRATIONS >150 ug/mL AT 4 HOURS AFTER INGESTION AND >50 ug/mL AT 12 HOURS AFTER INGESTION ARE OFTEN ASSOCIATED WITH TOXIC REACTIONS.   cbc     Status: None   Collection Time: 06/21/17  7:04 PM  Result Value Ref Range   WBC 6.9 4.0 - 10.5 K/uL   RBC 4.85 4.22 - 5.81 MIL/uL   Hemoglobin 13.9 13.0 - 17.0 g/dL   HCT 42.1 39.0 - 52.0 %   MCV 86.8 78.0 - 100.0 fL   MCH 28.7 26.0 - 34.0 pg   MCHC 33.0 30.0 - 36.0 g/dL   RDW 12.5 11.5 - 15.5 %   Platelets 261 150 - 400 K/uL  Rapid urine drug screen (hospital performed)     Status: None   Collection Time: 06/21/17  9:31 PM  Result Value Ref Range   Opiates NONE DETECTED NONE DETECTED   Cocaine NONE DETECTED NONE DETECTED   Benzodiazepines NONE DETECTED NONE DETECTED   Amphetamines NONE DETECTED NONE DETECTED   Tetrahydrocannabinol NONE DETECTED NONE DETECTED   Barbiturates NONE DETECTED NONE DETECTED    Comment: (NOTE) DRUG SCREEN FOR MEDICAL PURPOSES ONLY.  IF CONFIRMATION IS  NEEDED FOR ANY PURPOSE, NOTIFY LAB WITHIN 5 DAYS. LOWEST DETECTABLE LIMITS FOR URINE DRUG SCREEN Drug Class                     Cutoff (ng/mL) Amphetamine and metabolites    1000 Barbiturate and metabolites    200 Benzodiazepine                 242 Tricyclics and metabolites     300 Opiates and metabolites        300 Cocaine and metabolites        300 THC                            50     Blood Alcohol level:  Lab Results  Component Value Date   ETH <10 06/21/2017   ETH <5 35/36/1443    Metabolic Disorder Labs: Lab Results  Component Value Date   HGBA1C 5.4 12/11/2016   MPG 131 06/09/2015   MPG 131 (H) 12/15/2011   No results  found for: PROLACTIN Lab Results  Component Value Date   CHOL 195 12/08/2016   TRIG 87 12/08/2016   HDL 46 12/08/2016   CHOLHDL 4.2 12/08/2016   VLDL 17 12/08/2016   LDLCALC 132 (H) 12/08/2016   LDLCALC 66 06/10/2015    Physical Findings: AIMS: Facial and Oral Movements Muscles of Facial Expression: None, normal Lips and Perioral Area: None, normal Jaw: None, normal Tongue: None, normal,Extremity Movements Upper (arms, wrists, hands, fingers): None, normal Lower (legs, knees, ankles, toes): None, normal, Trunk Movements Neck, shoulders, hips: None, normal, Overall Severity Severity of abnormal movements (highest score from questions above): None, normal Incapacitation due to abnormal movements: None, normal Patient's awareness of abnormal movements (rate only patient's report): No Awareness, Dental Status Current problems with teeth and/or dentures?: No Does patient usually wear dentures?: No  CIWA:  CIWA-Ar Total: 1 COWS:  COWS Total Score: 0  Musculoskeletal: Strength & Muscle Tone: within normal limits Gait & Station: normal Patient leans: N/A  Psychiatric Specialty Exam: Physical Exam  Nursing note and vitals reviewed. Constitutional: He is oriented to person, place, and time. He appears well-developed and well-nourished.   Cardiovascular: Normal rate.  Respiratory: Effort normal.  Musculoskeletal: Normal range of motion.  Neurological: He is alert and oriented to person, place, and time.  Skin: Skin is warm.    Review of Systems  Constitutional: Negative.   HENT: Negative.   Eyes: Negative.   Respiratory: Negative.   Cardiovascular: Negative.   Gastrointestinal: Negative.   Genitourinary: Negative.   Musculoskeletal: Negative.   Skin: Negative.   Neurological: Negative.   Endo/Heme/Allergies: Negative.   Psychiatric/Behavioral: Positive for depression. Negative for hallucinations and suicidal ideas. The patient is nervous/anxious.     Blood pressure 133/90, pulse 74, temperature 98.3 F (36.8 C), temperature source Oral, resp. rate 16, height 5' 8"  (1.727 m), weight 80.7 kg (178 lb), SpO2 100 %.Body mass index is 27.06 kg/m.  General Appearance: Casual  Eye Contact:  Good  Speech:  Clear and Coherent and Normal Rate  Volume:  Normal  Mood:  Anxious and Depressed  Affect:  Depressed  Thought Process:  Goal Directed and Descriptions of Associations: Intact  Orientation:  Full (Time, Place, and Person)  Thought Content:  WDL  Suicidal Thoughts:  No  Homicidal Thoughts:  No  Memory:  Immediate;   Good Recent;   Good Remote;   Good  Judgement:  Fair  Insight:  Good  Psychomotor Activity:  Normal  Concentration:  Concentration: Good and Attention Span: Good  Recall:  Good  Fund of Knowledge:  Good  Language:  Good  Akathisia:  No  Handed:  Right  AIMS (if indicated):     Assets:  Communication Skills Desire for Improvement Financial Resources/Insurance Physical Health Social Support Transportation  ADL's:  Intact  Cognition:  WNL  Sleep:      Problems Addressed: MDD severe  Treatment Plan Summary: Daily contact with patient to assess and evaluate symptoms and progress in treatment, Medication management and Plan is to:  -Start Effexor-XR 37.5 mg PO Daily for mood  stability -Continue Remeron 7.5 mg PO QHS for mood stability -Continue Gabapentin 100 mg PO TID for withdrawal symptoms -Encourage group therapy participation  Lewis Shock, FNP 06/23/2017, 2:03 PM   Agree with NP Progress Note
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
## 2017-06-23 NOTE — Tx Team (Signed)
Interdisciplinary Treatment and Diagnostic Plan Update  06/23/2017 Time of Session: [name] MRN: [id number]  Principal Diagnosis: MDD (major depressive disorder), severe ([place])  Secondary Diagnoses: Principal Problem:   MDD (major depressive disorder), severe ([place]) Active Problems:   DM2 (diabetes mellitus, type 2) ([place])   CAD S/P percutaneous coronary angioplasty   Hypertension   Current Medications:  Current Facility-Administered Medications  Medication Dose Route Frequency Provider Last Rate Last Dose  . aspirin EC tablet 81 mg  81 mg Oral Daily Laverle Hobby, PA-C   81 mg at 06/23/17 2122  . bisacodyl (DULCOLAX) EC tablet 5 mg  5 mg Oral Daily PRN Money, Lowry Ram, FNP      . carvedilol (COREG) tablet 25 mg  25 mg Oral Daily Laverle Hobby, PA-C   25 mg at 06/23/17 4825  . gabapentin (NEURONTIN) capsule 100 mg  100 mg Oral TID Cobos, Myer Peer, MD   100 mg at 06/23/17 1208  . lisinopril (PRINIVIL,ZESTRIL) tablet 5 mg  5 mg Oral Daily Laverle Hobby, PA-C   5 mg at 06/23/17 0037  . metFORMIN (GLUCOPHAGE-XR) 24 hr tablet 500 mg  500 mg Oral Q breakfast Laverle Hobby, PA-C   500 mg at 06/23/17 0488  . mirtazapine (REMERON) tablet 7.5 mg  7.5 mg Oral QHS Cobos, Fernando A, MD   7.5 mg at 06/22/17 2128  . naproxen (NAPROSYN) tablet 375 mg  375 mg Oral BID PRN Cobos, Myer Peer, MD   375 mg at 06/23/17 1209  . nitroGLYCERIN (NITROSTAT) SL tablet 0.4 mg  0.4 mg Sublingual Q5 min PRN Patriciaann Clan E, PA-C      . polyethylene glycol (MIRALAX / GLYCOLAX) packet 17 g  17 g Oral Daily PRN Money, Lowry Ram, FNP      . venlafaxine XR (EFFEXOR-XR) 24 hr capsule 37.5 mg  37.5 mg Oral Daily Money, Lowry Ram, FNP       PTA Medications: Medications Prior to Admission  Medication Sig Dispense Refill Last Dose  . aspirin EC 81 MG tablet Take 1 tablet (81 mg total) by mouth daily. 90 tablet 3 Past Month at Unknown time  . carvedilol (COREG) 12.5 MG tablet Take 2 tablets (25 mg  total) by mouth daily. 90 tablet 1 Past Month at Unknown time  . Coenzyme Q10 (CO Q 10 PO) Take 1 capsule by mouth at bedtime.   Past Month at Unknown time  . cyclobenzaprine (FLEXERIL) 10 MG tablet Take 1 tablet (10 mg total) by mouth 3 (three) times daily as needed for muscle spasms. 30 tablet 0 Past Month at Unknown time  . lisinopril (PRINIVIL,ZESTRIL) 5 MG tablet Take 1 tablet (5 mg total) by mouth daily. 90 tablet 2 Past Month at Unknown time  . metFORMIN (GLUCOPHAGE-XR) 500 MG 24 hr tablet Take 1 tablet (500 mg total) by mouth daily with breakfast. 90 tablet 1 Past Month at Unknown time  . naproxen (NAPROSYN) 375 MG tablet Take 1 tablet (375 mg total) by mouth 2 (two) times daily. 20 tablet 0 Past Month at Unknown time  . nitroGLYCERIN (NITROSTAT) 0.4 MG SL tablet Place 1 tablet (0.4 mg total) under the tongue every 5 (five) minutes as needed. For chest pain 25 tablet 3 Past Month at Unknown time  . Omega-3 Fatty Acids (FISH OIL) 1200 MG CAPS Take 1 capsule (1,200 mg total) by mouth daily. 90 capsule 3 Past Month at Unknown time  . Pitavastatin Calcium (LIVALO) 4 MG TABS  Take 1 tablet (4 mg total) by mouth daily. 90 tablet 3 Past Month at Unknown time  . sildenafil (REVATIO) 20 MG tablet Take 2 to 3 tablets as needed for sexual activity 100 tablet 0 Past Month at Unknown time  . Testosterone Cypionate 200 MG/ML KIT Inject 0.8 mLs into the muscle once a week.   Past Month at Unknown time    Patient Stressors: Educational concerns  Patient Strengths: Ability for insight Active sense of humor Average or above average intelligence  Treatment Modalities: Medication Management, Group therapy, Case management,  1 to 1 session with clinician, Psychoeducation, Recreational therapy.   Physician Treatment Plan for Primary Diagnosis: MDD (major depressive disorder), severe ([place]) Long Term Goal(s): Improvement in symptoms so as ready for discharge Improvement in symptoms so as ready for discharge    Short Term Goals: Ability to identify changes in lifestyle to reduce recurrence of condition will improve Ability to maintain clinical measurements within normal limits will improve Compliance with prescribed medications will improve Ability to identify triggers associated with substance abuse/mental health issues will improve  Medication Management: Evaluate patient's response, side effects, and tolerance of medication regimen.  Therapeutic Interventions: 1 to 1 sessions, Unit Group sessions and Medication administration.  Evaluation of Outcomes: Progressing  Physician Treatment Plan for Secondary Diagnosis: Principal Problem:   MDD (major depressive disorder), severe ([place]) Active Problems:   DM2 (diabetes mellitus, type 2) ([place])   CAD S/P percutaneous coronary angioplasty   Hypertension  Long Term Goal(s): Improvement in symptoms so as ready for discharge Improvement in symptoms so as ready for discharge   Short Term Goals: Ability to identify changes in lifestyle to reduce recurrence of condition will improve Ability to maintain clinical measurements within normal limits will improve Compliance with prescribed medications will improve Ability to identify triggers associated with substance abuse/mental health issues will improve     Medication Management: Evaluate patient's response, side effects, and tolerance of medication regimen.  Therapeutic Interventions: 1 to 1 sessions, Unit Group sessions and Medication administration.  Evaluation of Outcomes: Progressing   RN Treatment Plan for Primary Diagnosis: MDD (major depressive disorder), severe ([place]) Long Term Goal(s): Knowledge of disease and therapeutic regimen to maintain health will improve  Short Term Goals: Ability to identify and develop effective coping behaviors will improve and Compliance with prescribed medications will improve  Medication Management: RN will administer medications as ordered by provider, will  assess and evaluate patient's response and provide education to patient for prescribed medication. RN will report any adverse and/or side effects to prescribing provider.  Therapeutic Interventions: 1 on 1 counseling sessions, Psychoeducation, Medication administration, Evaluate responses to treatment, Monitor vital signs and CBGs as ordered, Perform/monitor CIWA, COWS, AIMS and Fall Risk screenings as ordered, Perform wound care treatments as ordered.  Evaluation of Outcomes: Progressing   LCSW Treatment Plan for Primary Diagnosis: MDD (major depressive disorder), severe ([place]) Long Term Goal(s): Safe transition to appropriate next level of care at discharge, Engage patient in therapeutic group addressing interpersonal concerns.  Short Term Goals: Engage patient in aftercare planning with referrals and resources, Increase social support and Increase skills for wellness and recovery  Therapeutic Interventions: Assess for all discharge needs, 1 to 1 time with Social worker, Explore available resources and support systems, Assess for adequacy in community support network, Educate family and significant other(s) on suicide prevention, Complete Psychosocial Assessment, Interpersonal group therapy.  Evaluation of Outcomes: Progressing   Progress in Treatment: Attending groups: No. Participating in groups: No.  Taking medication as prescribed: Yes. Toleration medication: Yes. Family/Significant other contact made: No, will contact:  pt declined Patient understands diagnosis: Yes. Discussing patient identified problems/goals with staff: Yes. Medical problems stabilized or resolved: Yes. Denies suicidal/homicidal ideation: Yes. Issues/concerns per patient self-inventory: no Other: none  New problem(s) identified: No, Describe:  none  New Short Term/Long Term Goal(s):  Discharge Plan or Barriers:   Reason for Continuation of Hospitalization: Depression Medication stabilization  Estimated  Length of Stay: 3-5 days.  Attendees: Patient: 06/23/2017   Physician: Dr Parke Poisson, MD 06/23/2017  Nursing: Mayra Neer, RN 06/23/2017   RN Care Manager: 06/23/2017   Social Worker: Lurline Idol, LCSW 06/23/2017   Recreational Therapist:  06/23/2017   Other:  06/23/2017   Other:  06/23/2017   Other: 06/23/2017          Scribe for Treatment Team: Joanne Chars, LCSW 06/23/2017 1:49 PM

## 2017-06-23 NOTE — Progress Notes (Signed)
D: Pt on unit in day room.  Pt attends group and participates.  Pt sts later the "I think my BS is high."  Pt CBG=157.  Pt advised not to eat the snacks with sugar.  Pt A1C on admission=5.4.  A: Introduced self to pt.  Actively listened to pt and offered support and encouragement. Medications administered per order.  Q15 minute safety checks maintained. R: Pt is safe on the unit.  Pt is compliant with medications.  Pt verbally contracts for safety.  Will continue to monitor and assess.

## 2017-06-23 NOTE — Plan of Care (Signed)
  Progressing Activity: Imbalance in normal sleep/wake cycle will improve 06/23/2017 1029 - Progressing by Joice Lofts, RN Coping: Ability to cope will improve 06/23/2017 1029 - Progressing by Joice Lofts, RN

## 2017-06-23 NOTE — Progress Notes (Signed)
D: Patient is pleasant upon approach.  Discussed his blood pressure as it was elevated before he received his medications.  Patient continues to have passive SI, however, he contracts for safety on the unit.  His affect is flat and blunted; his mood is depressed.  He rates his depression as a 10; hopelessness and anxiety as a 9.  He is sleeping and eating well; his energy level is low and his concentration is poor.  Patient continues to acclimate to the unit.  A: Continue to monitor medication management and MD orders.  Safety checks continued every 15 minutes per protocol.  Offer support and encouragement as needed.  R: Patient is receptive to staff; he remains somewhat isolative.  Encouraged him to attend groups.

## 2017-06-24 MED ORDER — MIRTAZAPINE 15 MG PO TABS
15.0000 mg | ORAL_TABLET | Freq: Every day | ORAL | Status: DC
  Administered 2017-06-24: 15 mg via ORAL
  Filled 2017-06-24 (×2): qty 1

## 2017-06-24 MED ORDER — ACETAMINOPHEN 325 MG PO TABS: 650.0000 mg | ORAL_TABLET | Freq: Four times a day (QID) | ORAL | Status: DC | PRN

## 2017-06-24 MED ORDER — LISINOPRIL 10 MG PO TABS
10.0000 mg | ORAL_TABLET | Freq: Every day | ORAL | Status: DC
  Administered 2017-06-25 – 2017-06-28 (×4): 10 mg via ORAL
  Filled 2017-06-24 (×6): qty 1

## 2017-06-24 MED ORDER — ARIPIPRAZOLE 2 MG PO TABS
2.0000 mg | ORAL_TABLET | Freq: Every day | ORAL | Status: DC
  Administered 2017-06-24 – 2017-06-28 (×5): 2 mg via ORAL
  Filled 2017-06-24 (×8): qty 1

## 2017-06-24 NOTE — Progress Notes (Addendum)
Patient ID: [name], male   DOB: [birth year], 56 y.o.   MRN: [id number]  D: Patient denies SI/HI and auditory and visual hallucinations. Affect flat, mood depressed. Patient has slept in his room much of the morning. No complaints. Minimal interaction with staff and peers.  A: Patient given emotional support from RN. Patient given medications per MD orders. Patient encouraged to attend groups and unit activities. Patient encouraged to come to staff with any questions or concerns.  R: Patient remains cooperative and appropriate. Will continue to monitor patient for safety.

## 2017-06-24 NOTE — Progress Notes (Signed)
Pt attend wrap up group. His day was a 4. Pt said he suffering from depression. He waiting on his new medicine and he's being hopeful.

## 2017-06-24 NOTE — Progress Notes (Signed)
[facility] MD Progress Note  06/24/2017 2:12 PM [name]  MRN:  [id number]   Subjective:  Patient reports ongoing depression, states " I feel like there is a dark cloud over me", and reports subjective feeling of hopelessness. Denies suicidal ideations at present, and contracts for safety. Denies medication side effects.  Objective:  I have discussed case with treatment team and have met with patient. Patient presents depressed, sad, with constricted affect. Reports ongoing sense of anhedonia, sadness and hopelessness. Denies suicidal ideations, denies hallucinations , no delusions expressed . Denies medication side effects. No disruptive or agitated behaviors on unit. Polite on approach. Limited interaction with peers .  History of HTN- BP remains elevated. I have reviewed current antihypertensive medication regimen with hospitalist consultant- Dr. Shanon Brow  , recommendation to increase Lisinopril dose . Patient responds partially to support, encouragement, affect does improve slightly during session.   Principal Problem: MDD (major depressive disorder), severe ([place]) Diagnosis:   Patient Active Problem List   Diagnosis Date Noted  . MDD (major depressive disorder), severe ([place]) [F32.2] 06/23/2017  . Essential hypertension [I10] 01/13/2016  . Coronary artery disease involving native coronary artery of native heart without angina pectoris [I25.10] 01/13/2016  . Unstable angina ([place]) [I20.0]   . ED (erectile dysfunction) [N52.9] 02/15/2014  . Allergy to contrast media (used for diagnostic x-rays) [Z91.041] 05/12/2011  . Hypertension [I10] 05/09/2011  . Hyperlipidemia [E78.5] 05/09/2011  . DM2 (diabetes mellitus, type 2) ([place]) [E11.9] 07/10/2010  . CAD S/P percutaneous coronary angioplasty [I25.10, Z98.61] 07/10/2010  . DYSPEPSIA&OTHER SPEC DISORDERS FUNCTION STOMACH [K31.89, R10.13] 07/10/2010   Total Time spent with patient: 20 minutes  Past Psychiatric History: See H&P  Past Medical  History:  Past Medical History:  Diagnosis Date  . Arthritis   . Chronic lower back pain   . Coronary artery disease    a. Multiple prior caths/PCI. Cath 2013 with possible spasm of RCA, 70% ISR of mid LCx with subsequent DES to mLCx and prox LCX. b. H/o microvascular angina. c. Recurrent angina 08/2014 - s/p PTCA/DES to prox Cx, PTCA/CBA to OM1.  c. LHC 06/10/15 with patent stents and some ISR in LCX and OM-1 that was not flow limiting --> Rx   . Dyslipidemia    a. Intolerant to many statins except tolerating Livalo.  Marland Kitchen GERD (gastroesophageal reflux disease)   . Hypertension   . Myocardial infarction ([place]) ~ 2010  . S/P angioplasty with stent, DES, to proximal and mid LCX 12/15/11 12/15/2011  . Type II diabetes mellitus ([place])     Past Surgical History:  Procedure Laterality Date  . CARDIAC CATHETERIZATION  06/15/2002   LAD with prox 40% stenosis, norma L main, Cfx with 25% lesion, RCA with long mid 25% stenosis (Dr. Vita Barley)  . CARDIAC CATHETERIZATION  04/01/2010   normal L main, LAD wit mild stenosis, L Cfx with 70% in-stent restenosis, RCA with 70% in-stent restenosis, LVEF >60% (Dr. K. Mali Hilty) - cutting ballon arthrectomy to RCA & Cfx (Dr. Rockne Menghini)  . CARDIAC CATHETERIZATION  08/25/2010   preserved global LV contractility; multivessel CAD, diffuse 90-95% in-stent restenosis in prox placed Cfx stent - cutting balloon arthrectomy in Cfx with multiple dilatations 90-95% to 0% stenosis (Dr. Corky Downs)  . CARDIAC CATHETERIZATION  01/26/2011   PCI & stenting of aggresive in-stent restenosis within previously stented AV groove Cfx with 3.0x53m Taxus DES (previous stents were Promus) (Dr. JAdora Fridge  . CARDIAC CATHETERIZATION  05/11/2011   preserved LV function,  40% mid LAD stenosis, 30-40% narrowing proximal to stented semgnet of prox Cfx, patent mid RCA stent with smooth 20% narrowing in distal RCA (Dr. Corky Downs)  . CARDIAC CATHETERIZATION  12/15/2011   PCI & stenting of proximal & mid  Cfx with DES - 3.0x71m in proximal, 3.0x17min mid (Dr. J.Adora Fridge . CARDIAC CATHETERIZATION N/A 06/10/2015   Procedure: Left Heart Cath and Coronary Angiography;  Surgeon: DaJolaine ArtistMD;  [facility];  Service: Cardiovascular;  Laterality: N/A;  . cardiometabolic testing  2/0/81/4481 good exercise effort, peak VO2 79% predicted with normal VO2 HR curves (mild deconditioning)  . EXCISIONAL HEMORRHOIDECTOMY  1984  . LEFT HEART CATHETERIZATION WITH CORONARY ANGIOGRAM N/A 05/11/2011   Procedure: LEFT HEART CATHETERIZATION WITH CORONARY ANGIOGRAM;  Surgeon: ThTroy SineMD;  [facility];  Service: Cardiovascular;  Laterality: N/A;  Possible percutaneous coronary intervention, possible IVUS  . LEFT HEART CATHETERIZATION WITH CORONARY ANGIOGRAM N/A 12/15/2011   Procedure: LEFT HEART CATHETERIZATION WITH CORONARY ANGIOGRAM;  Surgeon: JoLorretta HarpMD;  [facility];  Service: Cardiovascular;  Laterality: N/A;  . LEFT HEART CATHETERIZATION WITH CORONARY ANGIOGRAM N/A 09/05/2014   Procedure: LEFT HEART CATHETERIZATION WITH CORONARY ANGIOGRAM;  Surgeon: ChBurnell BlanksMD;  [facility];  Service: Cardiovascular;  Laterality: N/A;  . LIPOMA EXCISION     back of the head  . NM MYOCAR PERF WALL MOTION  02/2012   lexiscan myoview; mild perfusion defect in mid inferolateral & basal inferolateral region (infarct/scar); EF 52%, abnormal but ow risk scan  . PERCUTANEOUS CORONARY STENT INTERVENTION (PCI-S)  09/05/2014   Procedure: PERCUTANEOUS CORONARY STENT INTERVENTION (PCI-S);  Surgeon: ChBurnell BlanksMD;  [facility];  Service: Cardiovascular;;   Family History:  Family History  Problem Relation Age of Onset  . Leukemia Mother   . Prostate cancer Father   . Coronary artery disease Paternal Grandmother   . Cancer Paternal Grandfather   . Cancer Brother    Family Psychiatric  History: See H&P Social History:  Social  History   Substance and Sexual Activity  Alcohol Use No   Comment: 12/15/11 "til 2010, drank a pint of gin/day at least"     Social History   Substance and Sexual Activity  Drug Use No   Comment: 12/15/11 "last cocaine was 2010"    Social History   Socioeconomic History  . Marital status: Divorced    Spouse name: None  . Number of children: 2  . Years of education: GED  . Highest education level: None  Social Needs  . Financial resource strain: None  . Food insecurity - worry: None  . Food insecurity - inability: None  . Transportation needs - medical: None  . Transportation needs - non-medical: None  Occupational History  . None  Tobacco Use  . Smoking status: Former Smoker    Packs/day: 1.00    Years: 10.00    Pack years: 10.00    Types: Cigarettes    Last attempt to quit: 04/07/2009    Years since quitting: 8.2  . Smokeless tobacco: Never Used  Substance and Sexual Activity  . 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 Drug use: No    Comment: 12/15/11 "last cocaine was 2010"  . Sexual activity: Not Currently  Other Topics Concern  . None  Social History Narrative  . None  Additional Social History:    Pain Medications: See MAR Prescriptions: See MAR Over the Counter: See MAR Longest period of sobriety (when/how long): Pt reported, he was sober for four years until three weeks ago.  Negative Consequences of Use: Personal relationships Withdrawal Symptoms: Agitation, Cramps Name of Substance 1: Cigarettes.  1 - Age of First Use: UTA 1 - Amount (size/oz): Pt reported, he started back smoking, three weeks ago.  1 - Frequency: UTA 1 - Duration: UTA 1 - Last Use / Amount: UTA Name of Substance 2: Alcohol. 2 - Age of First Use: UTA 2 - Amount (size/oz): Pt reported, he started back drinking Gin, three weeks ago.  2 - Duration: UTA 2 - Last Use / Amount: Pt reported, three days ago.   Sleep: Good  Appetite:   improving   Current Medications: Current Facility-Administered Medications  Medication Dose Route Frequency Provider Last Rate Last Dose  . ARIPiprazole (ABILIFY) tablet 2 mg  2 mg Oral Daily Christien Frankl, Myer Peer, MD      . aspirin EC tablet 81 mg  81 mg Oral Daily Laverle Hobby, PA-C   81 mg at 06/24/17 0947  . bisacodyl (DULCOLAX) EC tablet 5 mg  5 mg Oral Daily PRN Money, Lowry Ram, FNP   5 mg at 06/23/17 1403  . carvedilol (COREG) tablet 25 mg  25 mg Oral Daily Laverle Hobby, PA-C   25 mg at 06/24/17 0946  . gabapentin (NEURONTIN) capsule 100 mg  100 mg Oral TID Han Lysne, Myer Peer, MD   100 mg at 06/24/17 1215  . lisinopril (PRINIVIL,ZESTRIL) tablet 5 mg  5 mg Oral Daily Laverle Hobby, PA-C   5 mg at 06/24/17 0946  . metFORMIN (GLUCOPHAGE-XR) 24 hr tablet 500 mg  500 mg Oral Q breakfast Laverle Hobby, PA-C   500 mg at 06/24/17 0946  . mirtazapine (REMERON) tablet 15 mg  15 mg Oral QHS Tanveer Dobberstein A, MD      . naproxen (NAPROSYN) tablet 375 mg  375 mg Oral BID PRN Raynah Gomes, Myer Peer, MD   375 mg at 06/23/17 1209  . nitroGLYCERIN (NITROSTAT) SL tablet 0.4 mg  0.4 mg Sublingual Q5 min PRN Patriciaann Clan E, PA-C      . polyethylene glycol (MIRALAX / GLYCOLAX) packet 17 g  17 g Oral Daily PRN Money, Lowry Ram, FNP   17 g at 06/23/17 2107    Lab Results:  Results for orders placed or performed during the hospital encounter of 06/22/17 (from the past 48 hour(s))  Glucose, capillary     Status: Abnormal   Collection Time: 06/23/17  9:50 PM  Result Value Ref Range   Glucose-Capillary 156 (H) 65 - 99 mg/dL    Blood Alcohol level:  Lab Results  Component Value Date   ETH <10 06/21/2017   ETH <5 20/94/7096    Metabolic Disorder Labs: Lab Results  Component Value Date   HGBA1C 5.4 12/11/2016   MPG 131 06/09/2015   MPG 131 (H) 12/15/2011   No results found for: PROLACTIN Lab Results  Component Value Date   CHOL 195 12/08/2016   TRIG 87 12/08/2016   HDL 46 12/08/2016    CHOLHDL 4.2 12/08/2016   VLDL 17 12/08/2016   LDLCALC 132 (H) 12/08/2016   LDLCALC 66 06/10/2015    Physical Findings: AIMS: Facial and Oral Movements Muscles of Facial Expression: None, normal Lips and Perioral Area: None, normal Jaw: None, normal Tongue: None, normal,Extremity Movements Upper (arms,  wrists, hands, fingers): None, normal Lower (legs, knees, ankles, toes): None, normal, Trunk Movements Neck, shoulders, hips: None, normal, Overall Severity Severity of abnormal movements (highest score from questions above): None, normal Incapacitation due to abnormal movements: None, normal Patient's awareness of abnormal movements (rate only patient's report): No Awareness, Dental Status Current problems with teeth and/or dentures?: No Does patient usually wear dentures?: No  CIWA:  CIWA-Ar Total: 1 COWS:  COWS Total Score: 0  Musculoskeletal: Strength & Muscle Tone: within normal limits Gait & Station: normal Patient leans: N/A  Psychiatric Specialty Exam: Physical Exam  Nursing note and vitals reviewed. Constitutional: He is oriented to person, place, and time. He appears well-developed and well-nourished.  Cardiovascular: Normal rate.  Respiratory: Effort normal.  Musculoskeletal: Normal range of motion.  Neurological: He is alert and oriented to person, place, and time.  Skin: Skin is warm.    Review of Systems  Constitutional: Negative.   HENT: Negative.   Eyes: Negative.   Respiratory: Negative.   Cardiovascular: Negative.   Gastrointestinal: Negative.   Genitourinary: Negative.   Musculoskeletal: Negative.   Skin: Negative.   Neurological: Negative.   Endo/Heme/Allergies: Negative.   Psychiatric/Behavioral: Positive for depression. Negative for hallucinations and suicidal ideas. The patient is nervous/anxious.   describes mild headache, no chest pain, no shortness of breath, no nausea, no vomiting   Blood pressure (!) 157/107, pulse 67, temperature 97.6 F  (36.4 C), temperature source Oral, resp. rate 18, height 5' 8"  (1.727 m), weight 80.7 kg (178 lb), SpO2 100 %.Body mass index is 27.06 kg/m.  General Appearance: Fairly Groomed  Eye Contact:  Fair  Speech:  Normal Rate  Volume:  Normal  Mood:  Depressed  Affect:  remains constricted , but does smile briefly at times   Thought Process:  Linear and Descriptions of Associations: Intact  Orientation:  Other:  fully alert and attentive  Thought Content:  denies hallucinations, no delusions, not internally preoccupied   Suicidal Thoughts:  Yes.  without intent/plan denies plan or intention of hurting self or of suicide, and contracts for safety on unit, denies homicidal ideations  Homicidal Thoughts:  No  Memory:  Immediate;   Good Recent;   Good Remote;   Good  Judgement:  Fair- improving   Insight:  Good  Psychomotor Activity:  Normal  Concentration:  Concentration: Good and Attention Span: Good  Recall:  Good  Fund of Knowledge:  Good  Language:  Good  Akathisia:  No  Handed:  Right  AIMS (if indicated):     Assets:  Communication Skills Desire for Improvement Financial Resources/Insurance Physical Health Social Support Transportation  ADL's:  Intact  Cognition:  WNL  Sleep:  Number of Hours: 6.75    Assessment - patient remains depressed, with constricted but briefly reactive affect . Denies suicidal ideations , contracts for safety on unit. No psychotic symptoms. Expresses concern about elevated BP- history of HTN and of CAD.  Based on HTN, have opted to discontinue Effexor XR trial, as this medication may contribute to increased BP. We discussed other options and agrees to Abilify augmentation. No symptoms of  Alcohol WDL- no tremors, no diaphoresis, no restlessness , no tachycardia  Treatment Plan Summary: Treatment Plan reviewed as below today 12/27  Daily contact with patient to assess and evaluate symptoms and progress in treatment, Medication management and Plan is  to:  -D/C Effexor XR - see rationale as above -Start Abilify 2 mgrs QDAY as antidepressant augmentation -Increase  Remeron to  15  mg PO QHS for depression -Increase Lisinopril to 10 mgrs QDAY and continue Coreg 25 mgrs QDAY for HTN -C/C NSAID PRNs  -D/C Neurontin -Encourage group therapy participation to work on Radiographer, therapeutic and symptom reduction -Encourage efforts to work on recovery and relapse prevention -Treatment Team working on disposition plan -Check EKG   Jenne Campus, MD 06/24/2017, 2:12 PMPatient ID: [name], male   DOB: 06-17-1961, 56 y.o.   MRN: [id number]

## 2017-06-25 LAB — HEMOGLOBIN A1C
Hgb A1c MFr Bld: 6.1 % — ABNORMAL HIGH (ref 4.8–5.6)
MEAN PLASMA GLUCOSE: 128.37 mg/dL

## 2017-06-25 MED ORDER — MIRTAZAPINE 30 MG PO TABS
30.0000 mg | ORAL_TABLET | Freq: Every day | ORAL | Status: DC
  Administered 2017-06-25 – 2017-06-27 (×3): 30 mg via ORAL
  Filled 2017-06-25 (×5): qty 1

## 2017-06-25 NOTE — Progress Notes (Signed)
Pt attend wrap up group. His day was a 4. Pt said he has lower back pain. He cannot sleep. He has headache, hypertension and his blood pressure is high he cannot get to come down.

## 2017-06-25 NOTE — Progress Notes (Signed)
Pt asking if staff knows when he will be discharged.  Pt sts he had a better day today than yesterday. Pt is med compliant.  Pt attends group. Pt in bed reading and then goes to sleep.

## 2017-06-25 NOTE — Progress Notes (Signed)
Recreation Therapy Notes  Date: 06/25/17 Time: 0930 Location: [address] [facility]  Group Topic: Stress Management  Goal Area(s) Addresses:  Patient will verbalize importance of using healthy stress management.  Patient will identify positive emotions associated with healthy stress management.   Intervention: Stress Management  Activity :  Body Scan Meditation.  LRT introduced the stress management technique of meditation.  LRT played a meditation from the Calm app that guided them through a body scan to allow them to become aware of any sensations, tensions or uneasy feelings they may be experiencing.  Education:  Stress Management, Discharge Planning.   Education Outcome: Acknowledges edcuation/In group clarification offered/Needs additional education  Clinical Observations/Feedback: Pt did not attend group.    [name], LRT/CTRS         Ria Comment, [place] Santilli A 06/25/2017 11:01 AM

## 2017-06-25 NOTE — Progress Notes (Signed)
[facility] MD Progress Note  06/25/2017 12:46 PM [name]  MRN:  [id number]   Subjective:  Patient reports that he is still feeling depressed and has some anxiety. He reports his depression at 7/10 and anxiety at 6/10. He is concerned about his blood pressure and wants to make sure it stays managed well. He reports sleeping really good and having a good appetite. He denies any SI/HI/AVH and contracts for safety.  Objective: Patient's chart and findings reviewed and discussed with treatment team. Patient presents in his room and still isolates some due to reported anxiety from crowds. He was started on Lisinopril 10 mg Daily today and this morning his BOP was 147/100, but by 10 AM it was 127/85. Will increase his Remeron to 30 mg QHS to help improve depression.  Principal Problem: MDD (major depressive disorder), severe ([place]) Diagnosis:   Patient Active Problem List   Diagnosis Date Noted  . MDD (major depressive disorder), severe ([place]) [F32.2] 06/23/2017  . Essential hypertension [I10] 01/13/2016  . Coronary artery disease involving native coronary artery of native heart without angina pectoris [I25.10] 01/13/2016  . Unstable angina ([place]) [I20.0]   . ED (erectile dysfunction) [N52.9] 02/15/2014  . Allergy to contrast media (used for diagnostic x-rays) [Z91.041] 05/12/2011  . Hypertension [I10] 05/09/2011  . Hyperlipidemia [E78.5] 05/09/2011  . DM2 (diabetes mellitus, type 2) ([place]) [E11.9] 07/10/2010  . CAD S/P percutaneous coronary angioplasty [I25.10, Z98.61] 07/10/2010  . DYSPEPSIA&OTHER [facility] DISORDERS FUNCTION STOMACH [K31.89, R10.13] 07/10/2010   Total Time spent with patient: 25 minutes  Past Psychiatric History: See H&P  Past Medical History:  Past Medical History:  Diagnosis Date  . Arthritis   . Chronic lower back pain   . Coronary artery disease    a. Multiple prior caths/PCI. Cath 2013 with possible spasm of RCA, 70% ISR of mid LCx with subsequent DES to mLCx and prox LCX.  b. H/o microvascular angina. c. Recurrent angina 08/2014 - s/p PTCA/DES to prox Cx, PTCA/CBA to OM1.  c. LHC 06/10/15 with patent stents and some ISR in LCX and OM-1 that was not flow limiting --> Rx   . Dyslipidemia    a. Intolerant to many statins except tolerating Livalo.  Marland Kitchen GERD (gastroesophageal reflux disease)   . Hypertension   . Myocardial infarction ([place]) ~ 2010  . S/P angioplasty with stent, DES, to proximal and mid LCX 12/15/11 12/15/2011  . Type II diabetes mellitus ([place])     Past Surgical History:  Procedure Laterality Date  . CARDIAC CATHETERIZATION  06/15/2002   LAD with prox 40% stenosis, norma L main, Cfx with 25% lesion, RCA with long mid 25% stenosis (Dr. Vita Barley)  . CARDIAC CATHETERIZATION  04/01/2010   normal L main, LAD wit mild stenosis, L Cfx with 70% in-stent restenosis, RCA with 70% in-stent restenosis, LVEF >60% (Dr. K. Mali Hilty) - cutting ballon arthrectomy to RCA & Cfx (Dr. Rockne Menghini)  . CARDIAC CATHETERIZATION  08/25/2010   preserved global LV contractility; multivessel CAD, diffuse 90-95% in-stent restenosis in prox placed Cfx stent - cutting balloon arthrectomy in Cfx with multiple dilatations 90-95% to 0% stenosis (Dr. Corky Downs)  . CARDIAC CATHETERIZATION  01/26/2011   PCI & stenting of aggresive in-stent restenosis within previously stented AV groove Cfx with 3.0x52mm Taxus DES (previous stents were Promus) (Dr. Adora Fridge)  . CARDIAC CATHETERIZATION  05/11/2011   preserved LV function, 40% mid LAD stenosis, 30-40% narrowing proximal to stented semgnet of prox Cfx, patent mid RCA  stent with smooth 20% narrowing in distal RCA (Dr. Corky Downs)  . CARDIAC CATHETERIZATION  12/15/2011   PCI & stenting of proximal & mid Cfx with DES - 3.0x24mm in proximal, 3.0x35mm in mid (Dr. Adora Fridge)  . CARDIAC CATHETERIZATION N/A 06/10/2015   Procedure: Left Heart Cath and Coronary Angiography;  Surgeon: Jolaine Artist, MD;  Location: [place] [facility];  Service:  Cardiovascular;  Laterality: N/A;  . cardiometabolic testing  1/61/0960   good exercise effort, peak VO2 79% predicted with normal VO2 HR curves (mild deconditioning)  . EXCISIONAL HEMORRHOIDECTOMY  1984  . LEFT HEART CATHETERIZATION WITH CORONARY ANGIOGRAM N/A 05/11/2011   Procedure: LEFT HEART CATHETERIZATION WITH CORONARY ANGIOGRAM;  Surgeon: Troy Sine, MD;  Location: [facility] CATH LAB;  Service: Cardiovascular;  Laterality: N/A;  Possible percutaneous coronary intervention, possible IVUS  . LEFT HEART CATHETERIZATION WITH CORONARY ANGIOGRAM N/A 12/15/2011   Procedure: LEFT HEART CATHETERIZATION WITH CORONARY ANGIOGRAM;  Surgeon: Lorretta Harp, MD;  Location: [facility] CATH LAB;  Service: Cardiovascular;  Laterality: N/A;  . LEFT HEART CATHETERIZATION WITH CORONARY ANGIOGRAM N/A 09/05/2014   Procedure: LEFT HEART CATHETERIZATION WITH CORONARY ANGIOGRAM;  Surgeon: Burnell Blanks, MD;  Location: [facility] CATH LAB;  Service: Cardiovascular;  Laterality: N/A;  . LIPOMA EXCISION     back of the head  . NM MYOCAR PERF WALL MOTION  02/2012   lexiscan myoview; mild perfusion defect in mid inferolateral & basal inferolateral region (infarct/scar); EF 52%, abnormal but ow risk scan  . PERCUTANEOUS CORONARY STENT INTERVENTION (PCI-S)  09/05/2014   Procedure: PERCUTANEOUS CORONARY STENT INTERVENTION (PCI-S);  Surgeon: Burnell Blanks, MD;  Location: [facility] CATH LAB;  Service: Cardiovascular;;   Family History:  Family History  Problem Relation Age of Onset  . Leukemia Mother   . Prostate cancer Father   . Coronary artery disease Paternal Grandmother   . Cancer Paternal Grandfather   . Cancer Brother    Family Psychiatric  History: See H&P Social History:  Social History   Substance and Sexual Activity  Alcohol Use No   Comment: 12/15/11 "til 2010, drank a pint of gin/day at least"     Social History   Substance and Sexual Activity  Drug Use No   Comment: 12/15/11 "last cocaine was 2010"     Social History   Socioeconomic History  . Marital status: Divorced    Spouse name: None  . Number of children: 2  . Years of education: GED  . Highest education level: None  Social Needs  . Financial resource strain: None  . Food insecurity - worry: None  . Food insecurity - inability: None  . Transportation needs - medical: None  . Transportation needs - non-medical: None  Occupational History  . None  Tobacco Use  . Smoking status: Former Smoker    Packs/day: 1.00    Years: 10.00    Pack years: 10.00    Types: Cigarettes    Last attempt to quit: 04/07/2009    Years since quitting: 8.2  . Smokeless tobacco: Never Used  Substance and Sexual Activity  . 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 Drug use: No    Comment: 12/15/11 "last cocaine was 2010"  . Sexual activity: Not Currently  Other Topics Concern  . None  Social History Narrative  . 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  Negative Consequences of Use: Personal relationships Withdrawal Symptoms: Agitation, Cramps Name of Substance 1: Cigarettes.  1 - Age of First Use: UTA 1 - Amount (size/oz): Pt reported, he started back smoking, three weeks ago.  1 - Frequency: UTA 1 - Duration: UTA 1 - Last Use / Amount: UTA Name of Substance 2: Alcohol. 2 - Age of First Use: UTA 2 - Amount (size/oz): Pt reported, he started back drinking Gin, three weeks ago.  2 - Duration: UTA 2 - Last Use / Amount: Pt reported, three days ago.                 Sleep: Good  Appetite:  Good  Current Medications: Current Facility-Administered Medications  Medication Dose Route Frequency Provider Last Rate Last Dose  . acetaminophen (TYLENOL) tablet 650 mg  650 mg Oral Q6H PRN Cobos, Fernando A, MD       . ARIPiprazole (ABILIFY) tablet 2 mg  2 mg Oral Daily Cobos, Myer Peer, MD   2 mg at 06/25/17 0826  . aspirin EC tablet 81 mg  81 mg Oral Daily Laverle Hobby, PA-C   81 mg at 06/25/17 0827  . bisacodyl (DULCOLAX) EC tablet 5 mg  5 mg Oral Daily PRN Money, Lowry Ram, FNP   5 mg at 06/25/17 7408  . carvedilol (COREG) tablet 25 mg  25 mg Oral Daily Laverle Hobby, PA-C   25 mg at 06/25/17 1448  . gabapentin (NEURONTIN) capsule 100 mg  100 mg Oral TID Cobos, Myer Peer, MD   100 mg at 06/25/17 1159  . lisinopril (PRINIVIL,ZESTRIL) tablet 10 mg  10 mg Oral Daily Cobos, Myer Peer, MD   10 mg at 06/25/17 0826  . metFORMIN (GLUCOPHAGE-XR) 24 hr tablet 500 mg  500 mg Oral Q breakfast Laverle Hobby, PA-C   500 mg at 06/25/17 1856  . mirtazapine (REMERON) tablet 30 mg  30 mg Oral QHS Money, Lowry Ram, FNP      . nitroGLYCERIN (NITROSTAT) SL tablet 0.4 mg  0.4 mg Sublingual Q5 min PRN Patriciaann Clan E, PA-C      . polyethylene glycol (MIRALAX / GLYCOLAX) packet 17 g  17 g Oral Daily PRN Money, Lowry Ram, FNP   17 g at 06/24/17 1447    Lab Results:  Results for orders placed or performed during the hospital encounter of 06/22/17 (from the past 48 hour(s))  Glucose, capillary     Status: Abnormal   Collection Time: 06/23/17  9:50 PM  Result Value Ref Range   Glucose-Capillary 156 (H) 65 - 99 mg/dL  Hemoglobin A1c     Status: Abnormal   Collection Time: 06/25/17  6:09 AM  Result Value Ref Range   Hgb A1c MFr Bld 6.1 (H) 4.8 - 5.6 %    Comment: (NOTE) Pre diabetes:          5.7%-6.4% Diabetes:              >6.4% Glycemic control for   <7.0% adults with diabetes    Mean Plasma Glucose 128.37 mg/dL    Comment: Performed at [facility], [place] [address] [place], [place] [address]    Blood Alcohol level:  Lab Results  Component Value Date   [facility] <10 06/21/2017   ETH <5 02/63/7858    Metabolic Disorder Labs: Lab Results  Component  Value Date   HGBA1C 6.1 (H) 06/25/2017   MPG  128.37 06/25/2017   MPG 131 06/09/2015   No results found for: PROLACTIN Lab Results  Component Value Date   CHOL 195 12/08/2016   TRIG 87 12/08/2016   HDL 46 12/08/2016   CHOLHDL 4.2 12/08/2016   VLDL 17 12/08/2016   LDLCALC 132 (H) 12/08/2016   LDLCALC 66 06/10/2015    Physical Findings: AIMS: Facial and Oral Movements Muscles of Facial Expression: None, normal Lips and Perioral Area: None, normal Jaw: None, normal Tongue: None, normal,Extremity Movements Upper (arms, wrists, hands, fingers): None, normal Lower (legs, knees, ankles, toes): None, normal, Trunk Movements Neck, shoulders, hips: None, normal, Overall Severity Severity of abnormal movements (highest score from questions above): None, normal Incapacitation due to abnormal movements: None, normal Patient's awareness of abnormal movements (rate only patient's report): No Awareness, Dental Status Current problems with teeth and/or dentures?: No Does patient usually wear dentures?: No  CIWA:  CIWA-Ar Total: 1 COWS:  COWS Total Score: 0  Musculoskeletal: Strength & Muscle Tone: within normal limits Gait & Station: normal Patient leans: N/A  Psychiatric Specialty Exam: Physical Exam  Nursing note and vitals reviewed. Constitutional: He is oriented to person, place, and time. He appears well-developed and well-nourished.  Cardiovascular: Normal rate.  Respiratory: Effort normal.  Musculoskeletal: Normal range of motion.  Neurological: He is alert and oriented to person, place, and time.  Skin: Skin is warm.    Review of Systems  Constitutional: Negative.   HENT: Negative.   Eyes: Negative.   Respiratory: Negative.   Cardiovascular: Negative.   Gastrointestinal: Negative.   Genitourinary: Negative.   Musculoskeletal: Negative.   Skin: Negative.   Neurological: Negative.   Endo/Heme/Allergies: Negative.   Psychiatric/Behavioral: Positive for depression. Negative for hallucinations and suicidal ideas. The  patient is nervous/anxious.     Blood pressure (!) 147/100, pulse 67, temperature 98.3 F (36.8 C), resp. rate 18, height 5\' 8"  (1.727 m), weight 80.7 kg (178 lb), SpO2 100 %.Body mass index is 27.06 kg/m.  General Appearance: Casual  Eye Contact:  Good  Speech:  Clear and Coherent and Normal Rate  Volume:  Normal  Mood:  Depressed  Affect:  Flat  Thought Process:  Goal Directed and Descriptions of Associations: Intact  Orientation:  Full (Time, Place, and Person)  Thought Content:  WDL  Suicidal Thoughts:  No  Homicidal Thoughts:  No  Memory:  Immediate;   Good Recent;   Good Remote;   Good  Judgement:  Fair  Insight:  Good  Psychomotor Activity:  Normal  Concentration:  Concentration: Good and Attention Span: Good  Recall:  Good  Fund of Knowledge:  Good  Language:  Good  Akathisia:  No  Handed:  Right  AIMS (if indicated):     Assets:  Communication Skills Desire for Improvement Financial Resources/Insurance Housing Physical Health Social Support Transportation  ADL's:  Intact  Cognition:  WNL  Sleep:  Number of Hours: 6.25   Problems Addressed: MDD severe   Treatment Plan Summary: Daily contact with patient to assess and evaluate symptoms and progress in treatment, Medication management and Plan is to:  -Increase Remeron 30 mg PO QHS for mood stability -Continue Abilify 2 mg PO Daily for mood stability -Continue Gabapentin 100 mg PO TID  -Encourage group therapy participation  Lewis Shock, FNP 06/25/2017, 12:46 PM   Agree with NP Progress Note

## 2017-06-25 NOTE — Progress Notes (Signed)
Data. Patient denies SI/HI/AVH. Verbally contracts for safety on the unit and to come to staff before acting of any self harm thoughts/feelings.  Patient interacting well with staff and other patients. Affect is flat but does brighten with interaction. He reports he is feeling better, "And my hopelessness is somewhat better and I don't want to kill myself today, so that's good." He requested and received a list of his medications, "So I can understand all the different names of these meds I am on." On his self assessment he reports 8/10 for depression and 7/10 for hopelessness and anxiety. His goal for today is, "Hopelessness." Action. Emotional support and encouragement offered. Education provided on medication, indications and side effect. Q 15 minute checks done for safety. Response. Safety on the unit maintained through 15 minute checks.  Medications taken as prescribed. Attended groups. Remained calm and appropriate through out shift.

## 2017-06-25 NOTE — Progress Notes (Signed)
Pt attend wrap up group. His day was a 7. He finally began to feel better. His medication working.

## 2017-06-25 NOTE — Progress Notes (Signed)
Writer took over care of Pt at this time. Pt is seen laying in bed with eyes closed. Respirations even and unlabored. Will monitor.

## 2017-06-25 NOTE — Progress Notes (Signed)
Pt reports he is feeling some better and that the medications are helping.  He expressed concern that he did not have any diabetic snacks available and asked for a couple of apples.  Writer searched the unit galleys and did not find any fresh fruit, but there are sugar free cookies and fruit cups with no added sugar available as snacks, so pt was informed.  Later when writer was doing environmental checks, it was discovered that the pt had an apple in his room in a bag that he had brought back from lunch or dinner.  Pt's BP has been elevated also, so it was rechecked this evening.  With the dinamap, it was 159/140.  Writer rechecked it manually resulting in 144/82 sitting.  Pt seemed a little relieved.  He denies SI/HI/AVH at this time.  He has been sitting in the dayroom during the evening watching TV.  After receiving his night meds, he went to bed.  Support and encouragement offered.  Discharge plans are in process.  Safety maintained with q15 minute checks.

## 2017-06-26 NOTE — Progress Notes (Signed)
D: Pt denies SI/HI/AVH. Pt is pleasant and cooperative. Pt stated his depression was better, pt stated he was feeling a " lot better"  A: Pt was offered support and encouragement. Pt was given scheduled medications. Pt was encourage to attend groups. Q 15 minute checks were done for safety.   R:Pt attends groups and interacts well with peers and staff. Pt is taking medication. Pt has no complaints at this time.Pt receptive to treatment and safety maintained on unit.

## 2017-06-26 NOTE — BHH Group Notes (Signed)
[facility] LCSW Group Therapy Note  Date/Time:    06/26/2017 10:00-11:00AM  Type of Therapy and Topic:  Group Therapy:  Healthy vs Unhealthy Coping Skills  Participation Level:  Active   Description of Group:  The focus of this group was to determine what unhealthy coping techniques typically are used by group members and what healthy coping techniques would be helpful in coping with various problems. Patients were guided in becoming aware of the differences between healthy and unhealthy coping techniques.  Patients were asked to identify healthy coping skills they plan to use when they leave the hospital to fill "the hole" that is left by discontinuing the use of unhealthy coping skills such as self-harm, drinking, and stopping medication.  Therapeutic Goals 1. Patients described the reasons for their hospitalization, which included unhealthy coping they used that led to the hospital stay 2. Patients defined and discussed healthy vs unhealthy coping techniques 3. Patients identified their preferred coping techniques and identified whether these were healthy or unhealthy 4. Patients determined 1-2 healthy coping skills they would like to become more familiar with and use more often 5. Patients provided support and ideas to each other  Summary of Patient Progress: During group, patient expressed that he has had depressive episodes every 3-4 years, with no medications taken in between those episodes.  He talked at length about the losses this year of his wife, his 61yo nephew, and his mother.  He also talked about his multiple medical issues, describing his efforts to stay healthy as "being selfish."  CSW and group members challenged this thought, suggesting instead that he is engaging in good self-care.  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  He also discussed engaging in various musical outlets and mountain biking as a means of positive  coping.   Therapeutic Modalities Cognitive Behavioral Therapy Motivational Interviewing   Selmer Dominion, LCSW 06/26/2017, 11:08 AM

## 2017-06-26 NOTE — Progress Notes (Signed)
Nursing Progress Note: 7-7p  D- Mood is depressed, smiling more. Pt states " I'm feeling better I think I'm ready to go tomorrow ." Pt's appetite is good but needs reminders to follow diabetic and cardiac diet. Pt observed trying to eat 3 ice creams Affect is blunted and appropriate. Pt is able to contract for safety. Goal for today is prepare for discharge  A - Observed pt interacting in group and in the milieu.Support and encouragement offered, safety maintained with q 15 minutes...  R-Contracts for safety and continues to follow treatment plan, working on learning new coping skills. Educated pt on heathy food choices and medications.

## 2017-06-26 NOTE — Plan of Care (Signed)
  Coping: Ability to cope will improve 06/26/2017 2132 - Progressing by Providence Crosby, RN Note Pt stated he was doing better, less depression, pt seen in dayroom interacting with peers

## 2017-06-26 NOTE — Progress Notes (Signed)
Adult Psychoeducational Group Note  Date:  06/26/2017 Time:  9:02 PM  Group Topic/Focus:  Wrap-Up Group:   The focus of this group is to help patients review their daily goal of treatment and discuss progress on daily workbooks.  Participation Level:  Active  Participation Quality:  Appropriate  Affect:  Appropriate  Cognitive:  Appropriate  Insight: Appropriate  Engagement in Group:  Engaged  Modes of Intervention:  Discussion  Additional Comments:  Patient attended group and said that his day was a 7.  Patient said this was his first time getting out of bed within the past 20 days. Today,  his coping skill he watched tv.   Rateel Beldin W Consuela Widener [id number]/33/7445, 9:02 PM

## 2017-06-26 NOTE — Progress Notes (Signed)
[facility] MD Progress Note  06/26/2017 8:46 AM DESMAN POLAK  MRN:  [id number]   Subjective:  Patient reports he feels some improvement and expresses an increasing sense of optimism that medications are starting to help. He states he still feels depressed, but not as severely. Denies SI at this time. Denies medication side effects.  Objective: I have reviewed chart notes and have met with patient. Patient is presenting with partially improved mood and today exhibits a fuller range of affect, smiles briefly at times, improving eye contact, and is more verbal as well . Acknowledges partial improvement, but states he does still feel depressed . No suicidal ideations today. Denies medication side effects. Behavior on unit in good control.  More visible on unit, although interaction with peers remains limited . Labs reviewed - HgbA1C  6.1  Principal Problem: MDD (major depressive disorder), severe ([place]) Diagnosis:   Patient Active Problem List   Diagnosis Date Noted  . MDD (major depressive disorder), severe ([place]) [F32.2] 06/23/2017  . Essential hypertension [I10] 01/13/2016  . Coronary artery disease involving native coronary artery of native heart without angina pectoris [I25.10] 01/13/2016  . Unstable angina ([place]) [I20.0]   . ED (erectile dysfunction) [N52.9] 02/15/2014  . Allergy to contrast media (used for diagnostic x-rays) [Z91.041] 05/12/2011  . Hypertension [I10] 05/09/2011  . Hyperlipidemia [E78.5] 05/09/2011  . DM2 (diabetes mellitus, type 2) ([place]) [E11.9] 07/10/2010  . CAD S/P percutaneous coronary angioplasty [I25.10, Z98.61] 07/10/2010  . DYSPEPSIA&OTHER SPEC DISORDERS FUNCTION STOMACH [K31.89, R10.13] 07/10/2010   Total Time spent with patient: 20 minutes  Past Psychiatric History: See H&P  Past Medical History:  Past Medical History:  Diagnosis Date  . Arthritis   . Chronic lower back pain   . Coronary artery disease    a. Multiple prior caths/PCI. Cath 2013 with  possible spasm of RCA, 70% ISR of mid LCx with subsequent DES to mLCx and prox LCX. b. H/o microvascular angina. c. Recurrent angina 08/2014 - s/p PTCA/DES to prox Cx, PTCA/CBA to OM1.  c. LHC 06/10/15 with patent stents and some ISR in LCX and OM-1 that was not flow limiting --> Rx   . Dyslipidemia    a. Intolerant to many statins except tolerating Livalo.  Marland Kitchen GERD (gastroesophageal reflux disease)   . Hypertension   . Myocardial infarction ([place]) ~ 2010  . S/P angioplasty with stent, DES, to proximal and mid LCX 12/15/11 12/15/2011  . Type II diabetes mellitus ([place])     Past Surgical History:  Procedure Laterality Date  . CARDIAC CATHETERIZATION  06/15/2002   LAD with prox 40% stenosis, norma L main, Cfx with 25% lesion, RCA with long mid 25% stenosis (Dr. Vita Barley)  . CARDIAC CATHETERIZATION  04/01/2010   normal L main, LAD wit mild stenosis, L Cfx with 70% in-stent restenosis, RCA with 70% in-stent restenosis, LVEF >60% (Dr. K. Mali Hilty) - cutting ballon arthrectomy to RCA & Cfx (Dr. Rockne Menghini)  . CARDIAC CATHETERIZATION  08/25/2010   preserved global LV contractility; multivessel CAD, diffuse 90-95% in-stent restenosis in prox placed Cfx stent - cutting balloon arthrectomy in Cfx with multiple dilatations 90-95% to 0% stenosis (Dr. Corky Downs)  . CARDIAC CATHETERIZATION  01/26/2011   PCI & stenting of aggresive in-stent restenosis within previously stented AV groove Cfx with 3.0x2m Taxus DES (previous stents were Promus) (Dr. JAdora Fridge  . CARDIAC CATHETERIZATION  05/11/2011   preserved LV function, 40% mid LAD stenosis, 30-40% narrowing proximal to stented semgnet of prox  Cfx, patent mid RCA stent with smooth 20% narrowing in distal RCA (Dr. Corky Downs)  . CARDIAC CATHETERIZATION  12/15/2011   PCI & stenting of proximal & mid Cfx with DES - 3.0x26m in proximal, 3.0x174min mid (Dr. J.Adora Fridge . CARDIAC CATHETERIZATION N/A 06/10/2015   Procedure: Left Heart Cath and Coronary Angiography;   Surgeon: DaJolaine ArtistMD;  [facility];  Service: Cardiovascular;  Laterality: N/A;  . cardiometabolic testing  08/03/61/1497 good exercise effort, peak VO2 79% predicted with normal VO2 HR curves (mild deconditioning)  . EXCISIONAL HEMORRHOIDECTOMY  1984  . LEFT HEART CATHETERIZATION WITH CORONARY ANGIOGRAM N/A 05/11/2011   Procedure: LEFT HEART CATHETERIZATION WITH CORONARY ANGIOGRAM;  Surgeon: ThTroy SineMD;  [facility];  Service: Cardiovascular;  Laterality: N/A;  Possible percutaneous coronary intervention, possible IVUS  . LEFT HEART CATHETERIZATION WITH CORONARY ANGIOGRAM N/A 12/15/2011   Procedure: LEFT HEART CATHETERIZATION WITH CORONARY ANGIOGRAM;  Surgeon: JoLorretta HarpMD;  [facility];  Service: Cardiovascular;  Laterality: N/A;  . LEFT HEART CATHETERIZATION WITH CORONARY ANGIOGRAM N/A 09/05/2014   Procedure: LEFT HEART CATHETERIZATION WITH CORONARY ANGIOGRAM;  Surgeon: ChBurnell BlanksMD;  [facility];  Service: Cardiovascular;  Laterality: N/A;  . LIPOMA EXCISION     back of the head  . NM MYOCAR PERF WALL MOTION  02/2012   lexiscan myoview; mild perfusion defect in mid inferolateral & basal inferolateral region (infarct/scar); EF 52%, abnormal but ow risk scan  . PERCUTANEOUS CORONARY STENT INTERVENTION (PCI-S)  09/05/2014   Procedure: PERCUTANEOUS CORONARY STENT INTERVENTION (PCI-S);  Surgeon: ChBurnell BlanksMD;  [facility];  Service: Cardiovascular;;   Family History:  Family History  Problem Relation Age of Onset  . Leukemia Mother   . Prostate cancer Father   . Coronary artery disease Paternal Grandmother   . Cancer Paternal Grandfather   . Cancer Brother    Family Psychiatric  History: See H&P Social History:  Social History   Substance and Sexual Activity  Alcohol Use No   Comment: 12/15/11 "til 2010, drank a pint of gin/day at least"     Social History   Substance and Sexual  Activity  Drug Use No   Comment: 12/15/11 "last cocaine was 2010"    Social History   Socioeconomic History  . Marital status: Divorced    Spouse name: None  . Number of children: 2  . Years of education: GED  . Highest education level: None  Social Needs  . Financial resource strain: None  . Food insecurity - worry: None  . Food insecurity - inability: None  . Transportation needs - medical: None  . Transportation needs - non-medical: None  Occupational History  . None  Tobacco Use  . Smoking status: Former Smoker    Packs/day: 1.00    Years: 10.00    Pack years: 10.00    Types: Cigarettes    Last attempt to quit: 04/07/2009    Years since quitting: 8.2  . Smokeless tobacco: Never Used  Substance and Sexual Activity  . 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 Drug use: No    Comment: 12/15/11 "last cocaine was 2010"  . Sexual activity: Not Currently  Other Topics Concern  . None  Social History Narrative  . None   Additional Social History:    [facility]  Prescriptions: See MAR Over the Counter: See MAR Longest period of sobriety (when/how long): Pt reported, he was sober for four years until three weeks ago.  Negative Consequences of Use: Personal relationships Withdrawal Symptoms: Agitation, Cramps Name of Substance 1: Cigarettes.  1 - Age of First Use: UTA 1 - Amount (size/oz): Pt reported, he started back smoking, three weeks ago.  1 - Frequency: UTA 1 - Duration: UTA 1 - Last Use / Amount: UTA Name of Substance 2: Alcohol. 2 - Age of First Use: UTA 2 - Amount (size/oz): Pt reported, he started back drinking Gin, three weeks ago.  2 - Duration: UTA 2 - Last Use / Amount: Pt reported, three days ago.   Sleep: Good  Appetite:  Good  Current Medications: Current Facility-Administered Medications  Medication Dose Route Frequency Provider Last Rate Last Dose  . acetaminophen (TYLENOL) tablet 650  mg  650 mg Oral Q6H PRN Cobos, Fernando A, MD      . ARIPiprazole (ABILIFY) tablet 2 mg  2 mg Oral Daily Cobos, Myer Peer, MD   2 mg at 06/26/17 0831  . aspirin EC tablet 81 mg  81 mg Oral Daily Laverle Hobby, PA-C   81 mg at 06/26/17 0830  . bisacodyl (DULCOLAX) EC tablet 5 mg  5 mg Oral Daily PRN Money, Lowry Ram, FNP   5 mg at 06/25/17 5038  . carvedilol (COREG) tablet 25 mg  25 mg Oral Daily Patriciaann Clan E, PA-C   25 mg at 06/26/17 0830  . gabapentin (NEURONTIN) capsule 100 mg  100 mg Oral TID Cobos, Myer Peer, MD   100 mg at 06/26/17 0830  . lisinopril (PRINIVIL,ZESTRIL) tablet 10 mg  10 mg Oral Daily Cobos, Myer Peer, MD   10 mg at 06/26/17 0830  . metFORMIN (GLUCOPHAGE-XR) 24 hr tablet 500 mg  500 mg Oral Q breakfast Laverle Hobby, PA-C   500 mg at 06/26/17 0830  . mirtazapine (REMERON) tablet 30 mg  30 mg Oral QHS Money, Lowry Ram, FNP   30 mg at 06/25/17 2102  . nitroGLYCERIN (NITROSTAT) SL tablet 0.4 mg  0.4 mg Sublingual Q5 min PRN Patriciaann Clan E, PA-C      . polyethylene glycol (MIRALAX / GLYCOLAX) packet 17 g  17 g Oral Daily PRN Money, Lowry Ram, FNP   17 g at 06/26/17 8828    Lab Results:  Results for orders placed or performed during the hospital encounter of 06/22/17 (from the past 48 hour(s))  Hemoglobin A1c     Status: Abnormal   Collection Time: 06/25/17  6:09 AM  Result Value Ref Range   Hgb A1c MFr Bld 6.1 (H) 4.8 - 5.6 %    Comment: (NOTE) Pre diabetes:          5.7%-6.4% Diabetes:              >6.4% Glycemic control for   <7.0% adults with diabetes    Mean Plasma Glucose 128.37 mg/dL    Comment: Performed at [facility], [place] [address], [place], [place] [address]    Blood Alcohol level:  Lab Results  Component Value Date   [facility] <10 06/21/2017   ETH <5 17/91/5056    Metabolic Disorder Labs: Lab Results  Component Value Date   HGBA1C 6.1 (H) 06/25/2017   MPG 128.37 06/25/2017   MPG 131 06/09/2015   No results found for: PROLACTIN Lab  Results  Component Value Date   CHOL 195 12/08/2016  TRIG 87 12/08/2016   HDL 46 12/08/2016   CHOLHDL 4.2 12/08/2016   VLDL 17 12/08/2016   LDLCALC 132 (H) 12/08/2016   LDLCALC 66 06/10/2015    Physical Findings: AIMS: Facial and Oral Movements Muscles of Facial Expression: None, normal Lips and Perioral Area: None, normal Jaw: None, normal Tongue: None, normal,Extremity Movements Upper (arms, wrists, hands, fingers): None, normal Lower (legs, knees, ankles, toes): None, normal, Trunk Movements Neck, shoulders, hips: None, normal, Overall Severity Severity of abnormal movements (highest score from questions above): None, normal Incapacitation due to abnormal movements: None, normal Patient's awareness of abnormal movements (rate only patient's report): No Awareness, Dental Status Current problems with teeth and/or dentures?: No Does patient usually wear dentures?: No  CIWA:  CIWA-Ar Total: 1 COWS:  COWS Total Score: 0  Musculoskeletal: Strength & Muscle Tone: within normal limits Gait & Station: normal Patient leans: N/A  Psychiatric Specialty Exam: Physical Exam  Nursing note and vitals reviewed. Constitutional: He is oriented to person, place, and time. He appears well-developed and well-nourished.  Cardiovascular: Normal rate.  Respiratory: Effort normal.  Musculoskeletal: Normal range of motion.  Neurological: He is alert and oriented to person, place, and time.  Skin: Skin is warm.    Review of Systems  Constitutional: Negative.   HENT: Negative.   Eyes: Negative.   Respiratory: Negative.   Cardiovascular: Negative.   Gastrointestinal: Negative.   Genitourinary: Negative.   Musculoskeletal: Negative.   Skin: Negative.   Neurological: Negative.   Endo/Heme/Allergies: Negative.   Psychiatric/Behavioral: Positive for depression. Negative for hallucinations and suicidal ideas. The patient is nervous/anxious.   denies headache, denies chest pain, denies  shortness of breath, no vomiting   Blood pressure (!) 156/109, pulse 71, temperature 97.8 F (36.6 C), temperature source Oral, resp. rate 18, height 5' 8"  (1.727 m), weight 80.7 kg (178 lb), SpO2 100 %.Body mass index is 27.06 kg/m.  General Appearance: improving grooming   Eye Contact:  improved   Speech:  Normal Rate  Volume:  Normal  Mood:  less depressed   Affect:  remains constricted, but is more reactive, and smiles at times appropriately   Thought Process:  Linear and Descriptions of Associations: Intact  Orientation:  Other:  fully alert and attentive  Thought Content:  no hallucinations, no delusions, not internally preoccupied   Suicidal Thoughts:  No- denies suicidal or self injurious ideations at this time, and contracts for safety on unit   Homicidal Thoughts:  No  Memory:  recent and remote grossly intact   Judgement:  Other:  improving   Insight:  improving   Psychomotor Activity:  Normal  Concentration:  Concentration: Good and Attention Span: Good  Recall:  Good  Fund of Knowledge:  Good  Language:  Good  Akathisia:  No  Handed:  Right  AIMS (if indicated):     Assets:  Communication Skills Desire for Improvement Financial Resources/Insurance Housing Physical Health Social Support Transportation  ADL's:  Intact  Cognition:  WNL  Sleep:  Number of Hours: 6.75   Assessment - patient presenting with partially improved mood and range of affect/less severe depressive symptoms. Currently denies SI and contracts for safety on unit . Denies medication side effects.   Treatment Plan Summary: Treatment Plan reviewed as below today 12/29 Daily contact with patient to assess and evaluate symptoms and progress in treatment, Medication management and Plan is to:  -Continue Remeron 30 mg PO QHS for depression -Continue Abilify 2 mg PO Daily for antidepressant augmentation  -  Continue Gabapentin 100 mg PO TID for anxiety and pain -On Lisinopril and Coreg for HTN -On  Glucophage for history of DM -Encourage group therapy participation to work on Radiographer, therapeutic and symptom reduction -Treatment team working on disposition planning options  Jenne Campus, MD 06/26/2017, 8:46 AM   Patient ID: Lawson Fiscal, male   DOB: [birth year], 56 y.o.   MRN: [id number]

## 2017-06-27 DIAGNOSIS — F323 Major depressive disorder, single episode, severe with psychotic features: Secondary | ICD-10-CM | POA: Diagnosis present

## 2017-06-27 MED ORDER — LIDOCAINE 5 % EX PTCH
1.0000 | MEDICATED_PATCH | CUTANEOUS | Status: DC
  Administered 2017-06-27 – 2017-06-28 (×2): 1 via TRANSDERMAL
  Filled 2017-06-27 (×3): qty 1

## 2017-06-27 NOTE — Progress Notes (Signed)
D: Patient observed often isolative to room. Cautious in interactions but pleasant and cooperative. Patient's affect blunted , mood anixous and depressed. Per self inventory and discussions with writer, rates depression at a 1/10, hopelessness at a 1/10 and anxiety at a 6/10. Rates sleep as good, appetite as good, energy as normal and concentration as good.  States goal for today is "baby steps, try not to do too much." Reports chronic back pain currently at a 0/10 as provided ice packs have been of benefit. No other physical complaints.   A: Medicated per orders, no prns requested or required. Level III obs in place for safety. Emotional support offered and self inventory reviewed. Encouraged completion of Suicide Safety Plan and programming participation. Discussed POC with MD, SW.   R: Patient verbalizes understanding of POC. Patient denies SI/HI/AVH and remains safe on level III obs. Will continue to monitor closely and make verbal contact frequently.

## 2017-06-27 NOTE — Progress Notes (Signed)
Adult Psychoeducational Group Note  Date:  06/27/2017 Time:  9:24 PM  Group Topic/Focus:  Wrap-Up Group:   The focus of this group is to help patients review their daily goal of treatment and discuss progress on daily workbooks.  Participation Level:  Active  Participation Quality:  Appropriate  Affect:  Appropriate  Cognitive:  Alert  Insight: Appropriate  Engagement in Group:  Engaged  Modes of Intervention:  Discussion, Education and Socialization  Additional Comments:  Pt attended wrap up group and rated his day at a 7.5 out of 10. Pt stated he had "a good clean day". Pt's goal was to not sleep as much and to eat a little less throughout the day. Pt reported that he met his goal, and participating in activities/groups throughout the day increased his day's rating.  Rocky Crafts 06/27/2017, 9:24 PM

## 2017-06-27 NOTE — Plan of Care (Signed)
Patient verbalizes understanding of information, education provided.  Patient has not engaged in self harm, denies thoughts to do so.

## 2017-06-27 NOTE — BHH Group Notes (Signed)
[facility] LCSW Group Therapy Note  Date/Time:  06/27/2017 10:00-11:00AM  Type of Therapy and Topic:  Group Therapy:  Healthy and Unhealthy Supports  Participation Level:  Active   Description of Group:  Patients in this group were introduced to the idea of adding a variety of healthy supports to address the various needs in their lives. The picture on the front of Sunday's workbook was used to demonstrate why more supports are needed in every patient's life.  Patients identified and described healthy supports versus unhealthy supports in general, then gave examples of each in their own lives.   They discussed what additional healthy supports could be helpful in their recovery and wellness after discharge in order to prevent future hospitalizations.   An emphasis was placed on using counselor, doctor, therapy groups, 12-step groups, and problem-specific support groups to expand supports.  We also talked about how to deal with unhealthy supports through boundary-setting, psychoeducation with loved ones, and even termination of relationships.   Therapeutic Goals:   1)  discuss importance of adding supports to stay well once out of the hospital  2)  compare healthy versus unhealthy supports and identify some examples of each  3)  generate ideas and descriptions of healthy supports that can be added  4)  offer mutual support about how to address unhealthy supports  5)  encourage active participation in and adherence to discharge plan    Summary of Patient Progress:  The patient expressed a willingness to add a variety of supports to help in his recovery journey.  He acknowledged how a lack of boundaries at Alcoholics Anonymous meeting and at church have hurt him in the past when he has trusted people too much too soon.   Therapeutic Modalities:   Motivational Interviewing Brief Solution-Focused Therapy  Selmer Dominion, LCSW
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
## 2017-06-27 NOTE — Plan of Care (Signed)
  Coping: Ability to cope will improve 06/27/2017 2235 - Progressing by Providence Crosby, RN Note Pt seen in dayroom watching TV

## 2017-06-27 NOTE — Progress Notes (Signed)
D: Pt denies SI/HI/AVH. Pt is pleasant and cooperative. Pt ready for D/C tomorrow  A: Pt was offered support and encouragement. Pt was given scheduled medications. Pt was encourage to attend groups. Q 15 minute checks were done for safety.   R:Pt attends groups and interacts well with peers and staff. Pt is taking medication. Pt has no complaints.Pt receptive to treatment and safety maintained on unit.

## 2017-06-28 DIAGNOSIS — F332 Major depressive disorder, recurrent severe without psychotic features: Secondary | ICD-10-CM

## 2017-06-28 MED ORDER — GABAPENTIN 100 MG PO CAPS: 100.0000 mg | ORAL_CAPSULE | Freq: Three times a day (TID) | ORAL | 0 refills | Status: DC

## 2017-06-28 MED ORDER — LIDOCAINE 5 % EX PTCH: 1.0000 | MEDICATED_PATCH | CUTANEOUS | 0 refills | Status: DC

## 2017-06-28 MED ORDER — ARIPIPRAZOLE 2 MG PO TABS: 2.0000 mg | ORAL_TABLET | Freq: Every day | ORAL | 0 refills | Status: DC

## 2017-06-28 MED ORDER — LISINOPRIL 10 MG PO TABS: 10.0000 mg | ORAL_TABLET | Freq: Every day | ORAL | 0 refills | Status: DC

## 2017-06-28 MED ORDER — MIRTAZAPINE 30 MG PO TABS: 30.0000 mg | ORAL_TABLET | Freq: Every day | ORAL | 0 refills | Status: DC

## 2017-06-28 NOTE — Progress Notes (Signed)
Recreation Therapy Notes  Date: 06/28/17 Time: 0930 Location: [address] Dayroom  Group Topic: Stress Management  Goal Area(s) Addresses:  Patient will verbalize importance of using healthy stress management.  Patient will identify positive emotions associated with healthy stress management.   Intervention: Stress Management  Activity :  Guided Imagery.  LRT introduced the stress management technique of guided imagery.  LRT read a script on letting go of things that hold [place] back.  Patients were to listen and follow along as LRT read script to engage in activity.  Education:  Stress Management, Discharge Planning.   Education Outcome: Acknowledges edcuation/In group clarification offered/Needs additional education  Clinical Observations/Feedback: Pt did not attend group.     Ulus Hazen Graceann Congress A 06/28/2017 11:31 AM

## 2017-06-28 NOTE — [facility] Suicide Risk Assessment (Signed)
[facility] Discharge Suicide Risk Assessment   Principal Problem: MDD (major depressive disorder), severe [facility]) Discharge Diagnoses:  Patient Active Problem List   Diagnosis Date Noted  . MDD (major depressive disorder), single episode, severe with psychotic features ([place]) [F32.3] 06/27/2017  . MDD (major depressive disorder), severe ([place]) [F32.2] 06/23/2017  . Essential hypertension [I10] 01/13/2016  . Coronary artery disease involving native coronary artery of native heart without angina pectoris [I25.10] 01/13/2016  . Unstable angina ([place]) [I20.0]   . ED (erectile dysfunction) [N52.9] 02/15/2014  . Allergy to contrast media (used for diagnostic x-rays) [Z91.041] 05/12/2011  . Hypertension [I10] 05/09/2011  . Hyperlipidemia [E78.5] 05/09/2011  . DM2 (diabetes mellitus, type 2) ([place]) [E11.9] 07/10/2010  . CAD S/P percutaneous coronary angioplasty [I25.10, Z98.61] 07/10/2010  . DYSPEPSIA&OTHER SPEC DISORDERS FUNCTION STOMACH [K31.89, R10.13] 07/10/2010    Total Time spent with patient: 30 minutes  Musculoskeletal: Strength & Muscle Tone: within normal limits Gait & Station: normal Patient leans: N/A  Psychiatric Specialty Exam: ROS denies headache, no chest pain, no dyspnea, no vomiting    Blood pressure (!) 142/89, pulse 74, temperature 98.6 F (37 C), temperature source Oral, resp. rate 18, height 5\' 8"  (1.727 m), weight 80.7 kg (178 lb), SpO2 100 %.Body mass index is 27.06 kg/m.  General Appearance: Well Groomed  Eye Contact::  Good  Speech:  Normal Rate409  Volume:  Normal  Mood:  reports improving mood, states " I feel a lot better"  Affect:  Appropriate  Thought Process:  Linear and Descriptions of Associations: Intact  Orientation:  Full (Time, Place, and Person)  Thought Content:  no hallucinations, no delusions, not internally preoccupied   Suicidal Thoughts:  No denies suicidal or self injurious ideations, no homicidal or violent ideations  Homicidal Thoughts:  No   Memory:  recent and remote grossly intact  Judgement:  Good  Insight:  Present  Psychomotor Activity:  Normal  Concentration:  Good  Recall:  Good  Fund of Knowledge:Good  Language: Good  Akathisia:  Negative  Handed:  Right  AIMS (if indicated):     Assets:  Communication Skills Desire for Improvement Resilience  Sleep:  Number of Hours: 6.75  Cognition: WNL  ADL's:  Intact   Mental Status Per Nursing Assessment::   On Admission:     Demographic Factors:  56 year old male , widowed , has two children, lives alone   Loss Factors: Wife passed away several months ago, mother also passed away this year  Historical Factors: Prior history of depression, history of suicide attempt 1993, history of alcohol abuse, sober x 4 years   Risk Reduction Factors:   Sense of responsibility to family, Religious beliefs about death and Positive coping skills or problem solving skills  Continued Clinical Symptoms:  At this time patient is alert, attentive, well related, mood is improved and presents euthymic, with a full range of affect, no thought disorder, no suicidal or self injurious ideations, no psychotic symptoms, future oriented . Denies medication side effects. Behavior on unit calm, in good control, pleasant on approach.  Cognitive Features That Contribute To Risk:  No gross cognitive deficits noted upon discharge. Is alert , attentive, and oriented x 3   Suicide Risk:  Mild:  Suicidal ideation of limited frequency, intensity, duration, and specificity.  There are no identifiable plans, no associated intent, mild dysphoria and related symptoms, good self-control (both objective and subjective assessment), few other risk factors, and identifiable protective factors, including available and accessible social support.  Follow-up Information    [facility] Follow up on 07/05/2017.   Specialty:  Behavioral Health Why:  Hospital follow-up on Monday, 07/05/17 at 11:15AM. Please bring:  medication list/hospital discharge paperwork to this appt. Thank you.  Contact information: [place] [address] [phone]           Plan Of Care/Follow-up recommendations:  Activity:  as tolerated  Diet:  diabetic, heart healthy Tests:  NA  Other:  See below  Patient is leaving unit in good spirits, plans to return home, plans to follow up as above  Has an established PCP at [facility], MD 06/28/2017, 2:01 PM

## 2017-06-28 NOTE — Progress Notes (Signed)
Patient ID: [name], male   DOB: 15-Aug-1960, 56 y.o.   MRN: [id number]  Discharge Note- Belongings returned to patient at time of discharge. Discharge instructions and medications were reviewed with patient. Patient verbalized understanding of both medications and discharge instructions. Patient discharged to lobby where Betsy Pries was there to retrieve him and transport him to his car at [facility]. Q15 minute safety checks maintained until time of discharge. No distress noted upon patient discharge from unit.

## 2017-06-28 NOTE — Discharge Summary (Signed)
Physician Discharge Summary Note  Patient:  [name] is an 56 y.o., male MRN:  [id number] DOB:  09/20/1960 Patient phone:  [phone] (home)  Patient address:   [place] [address],  Total Time spent with patient: 20 minutes  Date of Admission:  06/22/2017 Date of Discharge: 06/28/17  Reason for Admission:  Worsening depression with SI  Principal Problem: MDD (major depressive disorder), severe [facility]) Discharge Diagnoses: Patient Active Problem List   Diagnosis Date Noted  . MDD (major depressive disorder), single episode, severe with psychotic features ([place]) [F32.3] 06/27/2017  . MDD (major depressive disorder), severe ([place]) [F32.2] 06/23/2017  . Essential hypertension [I10] 01/13/2016  . Coronary artery disease involving native coronary artery of native heart without angina pectoris [I25.10] 01/13/2016  . Unstable angina ([place]) [I20.0]   . ED (erectile dysfunction) [N52.9] 02/15/2014  . Allergy to contrast media (used for diagnostic x-rays) [Z91.041] 05/12/2011  . Hypertension [I10] 05/09/2011  . Hyperlipidemia [E78.5] 05/09/2011  . DM2 (diabetes mellitus, type 2) ([place]) [E11.9] 07/10/2010  . CAD S/P percutaneous coronary angioplasty [I25.10, Z98.61] 07/10/2010  . DYSPEPSIA&OTHER SPEC DISORDERS FUNCTION STOMACH [K31.89, R10.13] 07/10/2010    Past Psychiatric History: he has had one prior psychiatric admission in 1993 .  Reports history of depression, denies history of psychosis, denies history of mania, denies history of violence, describes some agoraphobia, but does not endorse panic attacks.  History of prior suicide attempt in 93 by driving off road, and in 05 by overdosing   Past Medical History:  Past Medical History:  Diagnosis Date  . Arthritis   . Chronic lower back pain   . Coronary artery disease    a. Multiple prior caths/PCI. Cath 2013 with possible spasm of RCA, 70% ISR of mid LCx with subsequent DES to mLCx and prox LCX. b. H/o  microvascular angina. c. Recurrent angina 08/2014 - s/p PTCA/DES to prox Cx, PTCA/CBA to OM1.  c. LHC 06/10/15 with patent stents and some ISR in LCX and OM-1 that was not flow limiting --> Rx   . Dyslipidemia    a. Intolerant to many statins except tolerating Livalo.  Marland Kitchen GERD (gastroesophageal reflux disease)   . Hypertension   . Myocardial infarction ([place]) ~ 2010  . S/P angioplasty with stent, DES, to proximal and mid LCX 12/15/11 12/15/2011  . Type II diabetes mellitus ([place])     Past Surgical History:  Procedure Laterality Date  . CARDIAC CATHETERIZATION  06/15/2002   LAD with prox 40% stenosis, norma L main, Cfx with 25% lesion, RCA with long mid 25% stenosis (Dr. Vita Barley)  . CARDIAC CATHETERIZATION  04/01/2010   normal L main, LAD wit mild stenosis, L Cfx with 70% in-stent restenosis, RCA with 70% in-stent restenosis, LVEF >60% (Dr. K. Mali Hilty) - cutting ballon arthrectomy to RCA & Cfx (Dr. Rockne Menghini)  . CARDIAC CATHETERIZATION  08/25/2010   preserved global LV contractility; multivessel CAD, diffuse 90-95% in-stent restenosis in prox placed Cfx stent - cutting balloon arthrectomy in Cfx with multiple dilatations 90-95% to 0% stenosis (Dr. Corky Downs)  . CARDIAC CATHETERIZATION  01/26/2011   PCI & stenting of aggresive in-stent restenosis within previously stented AV groove Cfx with 3.0x88m Taxus DES (previous stents were Promus) (Dr. JAdora Fridge  . CARDIAC CATHETERIZATION  05/11/2011   preserved LV function, 40% mid LAD stenosis, 30-40% narrowing proximal to stented semgnet of prox Cfx, patent mid RCA stent with smooth 20% narrowing in distal RCA (Dr. TCorky Downs  .  CARDIAC CATHETERIZATION  12/15/2011   PCI & stenting of proximal & mid Cfx with DES - 3.0x51m in proximal, 3.0x126min mid (Dr. J.Adora Fridge . CARDIAC CATHETERIZATION N/A 06/10/2015   Procedure: Left Heart Cath and Coronary Angiography;  Surgeon: DaJolaine ArtistMD;  [facility];  Service: Cardiovascular;   Laterality: N/A;  . cardiometabolic testing  07/31/93/6213 good exercise effort, peak VO2 79% predicted with normal VO2 HR curves (mild deconditioning)  . EXCISIONAL HEMORRHOIDECTOMY  1984  . LEFT HEART CATHETERIZATION WITH CORONARY ANGIOGRAM N/A 05/11/2011   Procedure: LEFT HEART CATHETERIZATION WITH CORONARY ANGIOGRAM;  Surgeon: ThTroy SineMD;  [facility];  Service: Cardiovascular;  Laterality: N/A;  Possible percutaneous coronary intervention, possible IVUS  . LEFT HEART CATHETERIZATION WITH CORONARY ANGIOGRAM N/A 12/15/2011   Procedure: LEFT HEART CATHETERIZATION WITH CORONARY ANGIOGRAM;  Surgeon: JoLorretta HarpMD;  [facility];  Service: Cardiovascular;  Laterality: N/A;  . LEFT HEART CATHETERIZATION WITH CORONARY ANGIOGRAM N/A 09/05/2014   Procedure: LEFT HEART CATHETERIZATION WITH CORONARY ANGIOGRAM;  Surgeon: ChBurnell BlanksMD;  [facility];  Service: Cardiovascular;  Laterality: N/A;  . LIPOMA EXCISION     back of the head  . NM MYOCAR PERF WALL MOTION  02/2012   lexiscan myoview; mild perfusion defect in mid inferolateral & basal inferolateral region (infarct/scar); EF 52%, abnormal but ow risk scan  . PERCUTANEOUS CORONARY STENT INTERVENTION (PCI-S)  09/05/2014   Procedure: PERCUTANEOUS CORONARY STENT INTERVENTION (PCI-S);  Surgeon: ChBurnell BlanksMD;  [facility];  Service: Cardiovascular;;   Family History:  Family History  Problem Relation Age of Onset  . Leukemia Mother   . Prostate cancer Father   . Coronary artery disease Paternal Grandmother   . Cancer Paternal Grandfather   . Cancer Brother    Family Psychiatric  History: history of alcohol dependence in family ( uncles). Reports grandmother and an uncle committed suicide   Social History:  Social History   Substance and Sexual Activity  Alcohol Use No   Comment: 12/15/11 "til 2010, drank a pint of gin/day at least"     Social History   Substance and  Sexual Activity  Drug Use No   Comment: 12/15/11 "last cocaine was 2010"    Social History   Socioeconomic History  . Marital status: Divorced    Spouse name: None  . Number of children: 2  . Years of education: GED  . Highest education level: None  Social Needs  . Financial resource strain: None  . Food insecurity - worry: None  . Food insecurity - inability: None  . Transportation needs - medical: None  . Transportation needs - non-medical: None  Occupational History  . None  Tobacco Use  . Smoking status: Former Smoker    Packs/day: 1.00    Years: 10.00    Pack years: 10.00    Types: Cigarettes    Last attempt to quit: 04/07/2009    Years since quitting: 8.2  . Smokeless tobacco: Never Used  Substance and Sexual Activity  . 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 Drug use: No    Comment: 12/15/11 "last cocaine was 2010"  . Sexual activity: Not Currently  Other Topics Concern  . None  Social History Narrative  . None    Hospital Course:   [facility]: [age].[facility]. male,  who presents voluntary and unaccompanied to [facility]. Clinician asked the pt, "what brought you to the hospital?" Pt reported, his wife two days before Mother's Day and his mother died, three weeks ago. Pt reported, he has been depressed for twenty days. Pt reported, he has Diabetes and he's lost a lot of weight sticking to his strict regimen. Pt reported, he was a vegetarian for the past three and half years, living a healthier lifestyle (going to the gym, eating better) however since his mother died he has been eating foods high in fat and sugar. Pt reported, three weeks ago, he started drinking alcohol and smoking cigarettes after four years of sobriety. Pt reported, he has been having thoughts of not wanting to be on this Earth anymore. Pt reported, he feels like theres nothing that drives him to go on. Pt reported in 1993, he attempted suicide by  driving his car off a bridge in [place], [place]. Pt reported, in 2005 he took all his pills in his medicine cabinet; and the medications "messed up" his liver. Pt reported, access to weapons: .38, a Ruger and kitchen knives. Pt reported, reported his guns are hidden. Pt denies. HI, AVH, and self-injurious behaviors.  Pt denies abuse. Pt's UDS is negative. Pt reported, he stop taking psychotropic medications because he has not has a depressive episode in several years. Pt reported, previous inpatient admissions. Pt presents quiet/awake in scrubs with logical/coherent speech. Pt's eye contact was fair. Pt's mood was depressed. Pt's affect was appropriate to circumstance. Pt's thought process was partial. Pt's judgement was partial. Pt's concentration was normal. Pt's insight was fair. Pt's impulse control was good. Pt was oriented x4. Pt reported, if discharged from [facility] he could not contract for safety. Pt reported, if inpatient treatment is recommended he would sign-in voluntarily.  06/22/17 [facility] MD Assessment: 56 year old male, presented to ED voluntarily yesterday due to worsening depression , with passive suicidal ideations , and reporting he has not been taking proper care of self or taking his medications regularly, because " I did not care anymore ".  Explains he has history of CAD/MI and for a long time had been watching his diet regularly, going to the gym several times a week, but that " the last few weeks I have been eating junk food again".  He attributes depression to loss of loved ones- states his wife died earlier this year , and that his mother passed away more recently .  Endorses neuro-vegetative symptoms as below, and states he has become more isolative and less sociable . Denies psychotic symptoms.  Patient reports history of alcohol use disorder, and states he relapsed on ETOH after a period of 4 years of sobriety. He relapsed about three weeks ago. He has been drinking in binges , 2 times per  week. States that on drinking days has been drinking up to a bottle of liquor. Last drank 3-4 days ago. Admission BAL < 10, Admission UDS negative   Patient remained on the [facility] unit for 6 days and stabilized with medication and therapy. Patient was started Remeron and titrated to 30 mg QHS, Abilify 2 mg Daily, and Gabapentin 100 mg TID. Patient showed improvement with improved mood, affect, sleep, appetite, and interaction. Patient has been seen in the day room interacting with peers and staff appropriately. Patient has been seen attending groups and participating. Patient agrees to follow up at [facility]. He denies any SI/HI/AVH and contracts for safety. Patient is provided with [prescriptions for  his medications upon discharge.   Physical Findings: AIMS: Facial and Oral Movements Muscles of Facial Expression: None, normal Lips and Perioral Area: None, normal Jaw: None, normal Tongue: None, normal,Extremity Movements Upper (arms, wrists, hands, fingers): None, normal Lower (legs, knees, ankles, toes): None, normal, Trunk Movements Neck, shoulders, hips: None, normal, Overall Severity Severity of abnormal movements (highest score from questions above): None, normal Incapacitation due to abnormal movements: None, normal Patient's awareness of abnormal movements (rate only patient's report): No Awareness, Dental Status Current problems with teeth and/or dentures?: No Does patient usually wear dentures?: No  CIWA:  CIWA-Ar Total: 1 COWS:  COWS Total Score: 0  Musculoskeletal: Strength & Muscle Tone: within normal limits Gait & Station: normal Patient leans: N/A  Psychiatric Specialty Exam: Physical Exam  Nursing note and vitals reviewed. Constitutional: He is oriented to person, place, and time. He appears well-developed and well-nourished.  Respiratory: Effort normal.  Musculoskeletal: Normal range of motion.  Neurological: He is alert and oriented to person, place, and time.   Skin: Skin is warm.    Review of Systems  Constitutional: Negative.   HENT: Negative.   Eyes: Negative.   Respiratory: Negative.   Cardiovascular: Negative.   Gastrointestinal: Negative.   Genitourinary: Negative.   Musculoskeletal: Negative.   Skin: Negative.   Neurological: Negative.   Endo/Heme/Allergies: Negative.   Psychiatric/Behavioral: Negative.     Blood pressure (!) 142/106, pulse (!) 105, temperature 98.6 F (37 C), temperature source Oral, resp. rate 18, height _0  (1.727 m), weight 80.7 kg (178 lb), SpO2 100 %.Body mass index is 27.06 kg/m.  General Appearance: Casual  Eye Contact:  Good  Speech:  Clear and Coherent and Normal Rate  Volume:  Normal  Mood:  Euthymic  Affect:  Congruent  Thought Process:  Goal Directed and Descriptions of Associations: Intact  Orientation:  Full (Time, Place, and Person)  Thought Content:  WDL  Suicidal Thoughts:  No  Homicidal Thoughts:  No  Memory:  Immediate;   Good Recent;   Good Remote;   Good  Judgement:  Good  Insight:  Good  Psychomotor Activity:  Normal  Concentration:  Concentration: Good and Attention Span: Good  Recall:  Good  Fund of Knowledge:  Good  Language:  Good  Akathisia:  No  Handed:  Right  AIMS (if indicated):     Assets:  Communication Skills Desire for Improvement Financial Resources/Insurance [place] Support Transportation  ADL's:  Intact  Cognition:  WNL  Sleep:  Number of Hours: 6.75     Have you used any form of tobacco in the last 30 days? (Cigarettes, Smokeless Tobacco, Cigars, and/or Pipes): No  Has this patient used any form of tobacco in the last 30 days? (Cigarettes, Smokeless Tobacco, Cigars, and/or Pipes) , No  Blood Alcohol level:  Lab Results  Component Value Date   ETH <10 06/21/2017   ETH <5 16/03/9603    Metabolic Disorder Labs:  Lab Results  Component Value Date   HGBA1C 6.1 (H) 06/25/2017   MPG 128.37 06/25/2017   MPG 131 06/09/2015    No results found for: PROLACTIN Lab Results  Component Value Date   CHOL 195 12/08/2016   TRIG 87 12/08/2016   HDL 46 12/08/2016   CHOLHDL 4.2 12/08/2016   VLDL 17 12/08/2016   LDLCALC 132 (H) 12/08/2016   LDLCALC 66 06/10/2015    See Psychiatric Specialty Exam and Suicide Risk Assessment completed by Attending Physician prior to discharge.  Discharge  destination:  Home  Is patient on multiple antipsychotic therapies at discharge:  No   Has Patient had three or more failed trials of antipsychotic monotherapy by history:  No  Recommended Plan for Multiple Antipsychotic Therapies: NA   Allergies as of 06/28/2017      Reactions   Bee Venom Anaphylaxis, Hives   Shellfish Allergy Anaphylaxis, Hives   Statins Other (See Comments)   Myalgias. Tolerating livalo.   Testosterone Cypionate    Testerone Injection --Increased breast tissue       Medication List    STOP taking these medications   CO Q 10 PO   cyclobenzaprine 10 MG tablet Commonly known as:  FLEXERIL   Fish Oil 1200 MG Caps   naproxen 375 MG tablet Commonly known as:  NAPROSYN   Pitavastatin Calcium 4 MG Tabs Commonly known as:  LIVALO   sildenafil 20 MG tablet Commonly known as:  REVATIO   Testosterone Cypionate 200 MG/ML Kit     TAKE these medications     Indication  ARIPiprazole 2 MG tablet Commonly known as:  ABILIFY Take 1 tablet (2 mg total) by mouth daily. For mood control Start taking on:  06/29/2017  Indication:  mood stability   aspirin EC 81 MG tablet Take 1 tablet (81 mg total) by mouth daily.  Indication:  Per PCP   carvedilol 12.5 MG tablet Commonly known as:  COREG Take 2 tablets (25 mg total) by mouth daily.  Indication:  Per PCP   gabapentin 100 MG capsule Commonly known as:  NEURONTIN Take 1 capsule (100 mg total) by mouth 3 (three) times daily. What changed:    medication strength  how much to take  when to take this  Indication:  Neuropathic Pain   lidocaine 5  % Commonly known as:  LIDODERM Place 1 patch onto the skin daily. Remove & Discard patch within 12 hours or as directed by MD  Indication:  chronic pain   lisinopril 10 MG tablet Commonly known as:  PRINIVIL,ZESTRIL Take 1 tablet (10 mg total) by mouth daily. For high blood pressure Start taking on:  06/29/2017 What changed:    medication strength  how much to take  additional instructions  Indication:  High Blood Pressure Disorder   metFORMIN 500 MG 24 hr tablet Commonly known as:  GLUCOPHAGE-XR Take 1 tablet (500 mg total) by mouth daily with breakfast.  Indication:  Type 2 Diabetes   mirtazapine 30 MG tablet Commonly known as:  REMERON Take 1 tablet (30 mg total) by mouth at bedtime. For mood control  Indication:  mood stability   nitroGLYCERIN 0.4 MG SL tablet Commonly known as:  NITROSTAT Place 1 tablet (0.4 mg total) under the tongue every 5 (five) minutes as needed. For chest pain  Indication:  Per PCP      Follow-up Information    [facility] Follow up on 06/28/2017.   Specialty:  Behavioral Health Why:  Hospital follow-up on Monday, 12/31 at 9:45AM. Please bring: medication list/hospital discharge paperwork to this appt. Thank you.  Contact information: [address] [place] [address] [phone]           Follow-up recommendations:  Continue activity as tolerated. Continue diet as recommended by your PCP. Ensure to keep all appointments with outpatient providers.  Comments:  Patient is instructed prior to discharge to: Take all medications as prescribed by his/her mental healthcare provider. Report any adverse effects and or reactions from the medicines to his/her outpatient provider promptly.  Patient has been instructed & cautioned: To not engage in alcohol and or illegal drug use while on prescription medicines. In the event of worsening symptoms, patient is instructed to call the crisis hotline, 911 and or go to the nearest ED for appropriate  evaluation and treatment of symptoms. To follow-up with his/her primary care provider for your other medical issues, concerns and or health care needs.    Signed: [place], FNP 06/28/2017, 8:42 AM   Patient seen, Suicide Assessment Completed.  Disposition Plan Reviewed

## 2017-06-28 NOTE — Progress Notes (Signed)
Patient ID: [name], male   DOB: 03/28/1961, 55 y.o.   MRN: [id number]  DAR: Pt. Denies SI/HI and A/V Hallucinations. He reports that his sleep last night was good, his appetite is good, his energy level is normal, and concentration is good. He rates his depression, hopelessness, and anxiety levels are 0/10. Patient does report chronic back pain and received his Lidocaine patch. Support and encouragement provided to the patient. Scheduled medications administered to patient per physician's orders. Patient is receptive and cooperative. He reports that he is looking forward to discharging soon. Q15 minute checks are maintained for safety.

## 2017-07-08 ENCOUNTER — Other Ambulatory Visit: Payer: Self-pay

## 2017-07-08 ENCOUNTER — Ambulatory Visit: Payer: Medicaid Other | Admitting: Internal Medicine

## 2017-07-08 ENCOUNTER — Emergency Department (HOSPITAL_COMMUNITY)
Admission: EM | Admit: 2017-07-08 | Discharge: 2017-07-08 | Disposition: A | Discharge status: Home / Self Care | Payer: Medicaid Other | Attending: Emergency Medicine | Admitting: Emergency Medicine

## 2017-07-08 ENCOUNTER — Emergency Department (HOSPITAL_COMMUNITY): Payer: Medicaid Other

## 2017-07-08 DIAGNOSIS — Z79899 Other long term (current) drug therapy: Secondary | ICD-10-CM | POA: Insufficient documentation

## 2017-07-08 DIAGNOSIS — Z7982 Long term (current) use of aspirin: Secondary | ICD-10-CM | POA: Insufficient documentation

## 2017-07-08 DIAGNOSIS — E119 Type 2 diabetes mellitus without complications: Secondary | ICD-10-CM | POA: Insufficient documentation

## 2017-07-08 DIAGNOSIS — Z87891 Personal history of nicotine dependence: Secondary | ICD-10-CM | POA: Diagnosis not present

## 2017-07-08 DIAGNOSIS — R300 Dysuria: Secondary | ICD-10-CM

## 2017-07-08 DIAGNOSIS — I10 Essential (primary) hypertension: Secondary | ICD-10-CM | POA: Diagnosis not present

## 2017-07-08 DIAGNOSIS — J069 Acute upper respiratory infection, unspecified: Secondary | ICD-10-CM | POA: Diagnosis not present

## 2017-07-08 DIAGNOSIS — Z7984 Long term (current) use of oral hypoglycemic drugs: Secondary | ICD-10-CM | POA: Insufficient documentation

## 2017-07-08 DIAGNOSIS — I25119 Atherosclerotic heart disease of native coronary artery with unspecified angina pectoris: Secondary | ICD-10-CM | POA: Insufficient documentation

## 2017-07-08 DIAGNOSIS — I209 Angina pectoris, unspecified: Secondary | ICD-10-CM

## 2017-07-08 LAB — CBC
HCT: 44.8 % (ref 39.0–52.0)
Hemoglobin: 14.2 g/dL (ref 13.0–17.0)
MCH: 27.8 pg (ref 26.0–34.0)
MCHC: 31.7 g/dL (ref 30.0–36.0)
MCV: 87.7 fL (ref 78.0–100.0)
PLATELETS: 251 10*3/uL (ref 150–400)
RBC: 5.11 MIL/uL (ref 4.22–5.81)
RDW: 12.8 % (ref 11.5–15.5)
WBC: 5.5 10*3/uL (ref 4.0–10.5)

## 2017-07-08 LAB — BASIC METABOLIC PANEL
Anion gap: 8 (ref 5–15)
BUN: 12 mg/dL (ref 6–20)
CALCIUM: 9.5 mg/dL (ref 8.9–10.3)
CO2: 27 mmol/L (ref 22–32)
CREATININE: 0.94 mg/dL (ref 0.61–1.24)
Chloride: 107 mmol/L (ref 101–111)
GLUCOSE: 75 mg/dL (ref 65–99)
Potassium: 5 mmol/L (ref 3.5–5.1)
Sodium: 142 mmol/L (ref 135–145)

## 2017-07-08 LAB — URINALYSIS, ROUTINE W REFLEX MICROSCOPIC
BILIRUBIN URINE: NEGATIVE
GLUCOSE, UA: NEGATIVE mg/dL
HGB URINE DIPSTICK: NEGATIVE
KETONES UR: NEGATIVE mg/dL
Leukocytes, UA: NEGATIVE
Nitrite: NEGATIVE
PH: 7 (ref 5.0–8.0)
PROTEIN: NEGATIVE mg/dL
Specific Gravity, Urine: 1.021 (ref 1.005–1.030)

## 2017-07-08 LAB — I-STAT TROPONIN, ED
TROPONIN I, POC: 0.01 ng/mL (ref 0.00–0.08)
Troponin i, poc: 0 ng/mL (ref 0.00–0.08)

## 2017-07-08 MED ORDER — DOXYCYCLINE HYCLATE 100 MG PO TABS
100.0000 mg | ORAL_TABLET | Freq: Once | ORAL | Status: AC
  Administered 2017-07-08: 100 mg via ORAL
  Filled 2017-07-08: qty 1

## 2017-07-08 MED ORDER — BENZONATATE 100 MG PO CAPS: 100.0000 mg | ORAL_CAPSULE | Freq: Three times a day (TID) | ORAL | 0 refills | Status: DC

## 2017-07-08 MED ORDER — DOXYCYCLINE HYCLATE 100 MG PO CAPS: 100.0000 mg | ORAL_CAPSULE | Freq: Two times a day (BID) | ORAL | 0 refills | Status: DC

## 2017-07-08 NOTE — ED Provider Notes (Signed)
[facility] EMERGENCY DEPARTMENT Provider Note   CSN: [id number] Arrival date & time: 07/08/17  1111     History   Chief Complaint Chief Complaint  Patient presents with  . Chest Pain  . Dysuria    HPI [name] is a 57 y.o. male.  HPI   57 year old male with history of CAD status post multiple cath, hypertension, diabetes, hyperlipidemia, GERD presenting with multiple complaints.  Patient report for the past week and a half he has had cold symptoms including headache, sinus congestion, sneezing, coughing, ear discomfort, throat irritation, body aches.  Symptoms not improved with TheraFlu and NyQuil.  Furthermore he endorsed for the past 4 days he noted some burning on urination without any blood, or discharge.  No increase in frequency or urgency.  He also complaining of intermittent burning sensation in his mid chest with exertion for nearly a week.  Pain felt similar to prior anginal pain that he had in the past.  No lightheadedness, dizziness, nausea, diaphoresis.  He has had 5 stents.  No prior flu or pneumonia vaccine.  He is an occasional smoker and social drinker.  Denies any new medication changes.  Patient mentioned he is not sexually active and no new sexual partners.      Past Medical History:  Diagnosis Date  . Arthritis   . Chronic lower back pain   . Coronary artery disease    a. Multiple prior caths/PCI. Cath 2013 with possible spasm of RCA, 70% ISR of mid LCx with subsequent DES to mLCx and prox LCX. b. H/o microvascular angina. c. Recurrent angina 08/2014 - s/p PTCA/DES to prox Cx, PTCA/CBA to OM1.  c. LHC 06/10/15 with patent stents and some ISR in LCX and OM-1 that was not flow limiting --> Rx   . Dyslipidemia    a. Intolerant to many statins except tolerating Livalo.  Marland Kitchen GERD (gastroesophageal reflux disease)   . Hypertension   . Myocardial infarction ([place]) ~ 2010  . S/P angioplasty with stent, DES, to proximal and mid LCX 12/15/11  12/15/2011  . Type II diabetes mellitus [facility])     Patient Active Problem List   Diagnosis Date Noted  . Severe episode of recurrent major depressive disorder, without psychotic features ([place])   . MDD (major depressive disorder), single episode, severe with psychotic features ([place]) 06/27/2017  . MDD (major depressive disorder), severe ([place]) 06/23/2017  . Essential hypertension 01/13/2016  . Coronary artery disease involving native coronary artery of native heart without angina pectoris 01/13/2016  . Unstable angina ([place])   . ED (erectile dysfunction) 02/15/2014  . Allergy to contrast media (used for diagnostic x-rays) 05/12/2011  . Hypertension 05/09/2011  . Hyperlipidemia 05/09/2011  . DM2 (diabetes mellitus, type 2) ([place]) 07/10/2010  . CAD S/P percutaneous coronary angioplasty 07/10/2010  . DYSPEPSIA&OTHER [facility] DISORDERS FUNCTION STOMACH 07/10/2010    Past Surgical History:  Procedure Laterality Date  . CARDIAC CATHETERIZATION  06/15/2002   LAD with prox 40% stenosis, norma L main, Cfx with 25% lesion, RCA with long mid 25% stenosis (Dr. Vita Barley)  . CARDIAC CATHETERIZATION  04/01/2010   normal L main, LAD wit mild stenosis, L Cfx with 70% in-stent restenosis, RCA with 70% in-stent restenosis, LVEF >60% (Dr. K. Mali Hilty) - cutting ballon arthrectomy to RCA & Cfx (Dr. Rockne Menghini)  . CARDIAC CATHETERIZATION  08/25/2010   preserved global LV contractility; multivessel CAD, diffuse 90-95% in-stent restenosis in prox placed Cfx stent - cutting balloon arthrectomy in  Cfx with multiple dilatations 90-95% to 0% stenosis (Dr. Corky Downs)  . CARDIAC CATHETERIZATION  01/26/2011   PCI & stenting of aggresive in-stent restenosis within previously stented AV groove Cfx with 3.0x34mm Taxus DES (previous stents were Promus) (Dr. Adora Fridge)  . CARDIAC CATHETERIZATION  05/11/2011   preserved LV function, 40% mid LAD stenosis, 30-40% narrowing proximal to stented semgnet of prox Cfx, patent mid RCA stent  with smooth 20% narrowing in distal RCA (Dr. Corky Downs)  . CARDIAC CATHETERIZATION  12/15/2011   PCI & stenting of proximal & mid Cfx with DES - 3.0x89mm in proximal, 3.0x4mm in mid (Dr. Adora Fridge)  . CARDIAC CATHETERIZATION N/A 06/10/2015   Procedure: Left Heart Cath and Coronary Angiography;  Surgeon: Jolaine Artist, MD;  Location: [facility] CV LAB;  Service: Cardiovascular;  Laterality: N/A;  . cardiometabolic testing  4/85/4627   good exercise effort, peak VO2 79% predicted with normal VO2 HR curves (mild deconditioning)  . EXCISIONAL HEMORRHOIDECTOMY  1984  . LEFT HEART CATHETERIZATION WITH CORONARY ANGIOGRAM N/A 05/11/2011   Procedure: LEFT HEART CATHETERIZATION WITH CORONARY ANGIOGRAM;  Surgeon: Troy Sine, MD;  Location: [facility] CATH LAB;  Service: Cardiovascular;  Laterality: N/A;  Possible percutaneous coronary intervention, possible IVUS  . LEFT HEART CATHETERIZATION WITH CORONARY ANGIOGRAM N/A 12/15/2011   Procedure: LEFT HEART CATHETERIZATION WITH CORONARY ANGIOGRAM;  Surgeon: Lorretta Harp, MD;  Location: [facility] CATH LAB;  Service: Cardiovascular;  Laterality: N/A;  . LEFT HEART CATHETERIZATION WITH CORONARY ANGIOGRAM N/A 09/05/2014   Procedure: LEFT HEART CATHETERIZATION WITH CORONARY ANGIOGRAM;  Surgeon: Burnell Blanks, MD;  Location: [facility] CATH LAB;  Service: Cardiovascular;  Laterality: N/A;  . LIPOMA EXCISION     back of the head  . NM MYOCAR PERF WALL MOTION  02/2012   lexiscan myoview; mild perfusion defect in mid inferolateral & basal inferolateral region (infarct/scar); EF 52%, abnormal but ow risk scan  . PERCUTANEOUS CORONARY STENT INTERVENTION (PCI-S)  09/05/2014   Procedure: PERCUTANEOUS CORONARY STENT INTERVENTION (PCI-S);  Surgeon: Burnell Blanks, MD;  Location: [facility] CATH LAB;  Service: Cardiovascular;;       Home Medications    Prior to Admission medications   Medication Sig Start Date End Date Taking? Authorizing Provider  ARIPiprazole (ABILIFY) 2 MG  tablet Take 1 tablet (2 mg total) by mouth daily. For mood control 06/29/17  Yes Money, Lowry Ram, FNP  aspirin EC 81 MG tablet Take 1 tablet (81 mg total) by mouth daily. 01/18/17  Yes Hilty, Nadean Corwin, MD  carvedilol (COREG) 12.5 MG tablet Take 2 tablets (25 mg total) by mouth daily. Patient taking differently: Take 12.5 mg by mouth 2 (two) times daily with a meal.  03/02/17  Yes Scot Jun, FNP  co-enzyme Q-10 30 MG capsule Take 30 mg by mouth daily.   Yes [provider]  gabapentin (NEURONTIN) 100 MG capsule Take 1 capsule (100 mg total) by mouth 3 (three) times daily. 06/28/17  Yes Money, Lowry Ram, FNP  lisinopril (PRINIVIL,ZESTRIL) 10 MG tablet Take 1 tablet (10 mg total) by mouth daily. For high blood pressure 06/29/17  Yes Money, Lowry Ram, FNP  metFORMIN (GLUCOPHAGE-XR) 500 MG 24 hr tablet Take 1 tablet (500 mg total) by mouth daily with breakfast. 01/12/17  Yes Scot Jun, FNP  mirtazapine (REMERON) 30 MG tablet Take 1 tablet (30 mg total) by mouth at bedtime. For mood control 06/28/17  Yes Money, Lowry Ram, FNP  Omega-3 Fatty Acids (FISH OIL) 1000  MG CPDR Take 1,000 capsules by mouth daily.   Yes [provider]  traMADol (ULTRAM) 50 MG tablet Take 50 mg by mouth every 6 (six) hours as needed.   Yes [provider]  lidocaine (LIDODERM) 5 % Place 1 patch onto the skin daily. Remove & Discard patch within 12 hours or as directed by MD 06/28/17   Money, Lowry Ram, FNP  nitroGLYCERIN (NITROSTAT) 0.4 MG SL tablet Place 1 tablet (0.4 mg total) under the tongue every 5 (five) minutes as needed. For chest pain 08/28/13   Pixie Casino, MD    Family History Family History  Problem Relation Age of Onset  . Leukemia Mother   . Prostate cancer Father   . Coronary artery disease Paternal Grandmother   . Cancer Paternal Grandfather   . Cancer Brother     Social History Social History   Tobacco Use  . Smoking status: Former Smoker    Packs/day: 1.00     Years: 10.00    Pack years: 10.00    Types: Cigarettes    Last attempt to quit: 04/07/2009    Years since quitting: 8.2  . Smokeless tobacco: Never Used  Substance Use Topics  . 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 Drug use: No    Comment: 12/15/11 "last cocaine was 2010"     Allergies   Bee venom; Shellfish allergy; Statins; and Testosterone cypionate   Review of Systems Review of Systems  All other systems reviewed and are negative.    Physical Exam Updated Vital Signs BP (!) 146/96 (BP Location: Right Arm)   Pulse 62   Temp 98.1 F (36.7 C) (Oral)   Resp 16   Ht 5\' 8"  (1.727 m)   Wt 80.7 kg (178 lb)   SpO2 100%   BMI 27.06 kg/m   Physical Exam  Constitutional: He appears well-developed and well-nourished. No distress.  HENT:  Head: Normocephalic and atraumatic.  Eyes: Conjunctivae are normal.  Neck: Normal range of motion. Neck supple.  Cardiovascular: Normal rate, regular rhythm, intact distal pulses and normal pulses.  Pulmonary/Chest: Effort normal and breath sounds normal. He has no decreased breath sounds. He has no wheezes. He has no rales.  Abdominal: Soft. There is no tenderness.  Musculoskeletal: Normal range of motion.       Right lower leg: He exhibits no edema.       Left lower leg: He exhibits no edema.  Neurological: He is alert.  Skin: Skin is warm. Capillary refill takes less than 2 seconds. No rash noted.  Psychiatric: He has a normal mood and affect.  Nursing note and vitals reviewed.    ED Treatments / Results  Labs (all labs ordered are listed, but only abnormal results are displayed) Labs Reviewed  BASIC METABOLIC PANEL  CBC  URINALYSIS, ROUTINE W REFLEX MICROSCOPIC  I-STAT TROPONIN, ED  I-STAT TROPONIN, ED    EKG  EKG Interpretation None     ED ECG REPORT   Date: 07/08/2017  Rate: 74  Rhythm: normal sinus rhythm with sinus arrhythmia  QRS Axis: normal  Intervals:  normal  ST/T Wave abnormalities: normal  Conduction Disutrbances:none  Narrative Interpretation:   Old EKG Reviewed: unchanged  I have personally reviewed the EKG tracing and agree with the computerized printout as noted.   Radiology Dg Chest 2 View  Result Date: 07/08/2017 CLINICAL DATA:  Central chest pain and burning sensation starting  this morning. Productive cough. EXAM: CHEST  2 VIEW COMPARISON:  06/09/2015 FINDINGS: Stable appearance of coronary stents. Heart size within normal limits. Mediastinal contours normal. No significant lung abnormality. Mild thoracic spondylosis. No pleural effusion. IMPRESSION: 1.  No active cardiopulmonary disease is radiographically apparent. 2. Mild thoracic spondylosis. Electronically Signed   By: Van Clines M.D.   On: 07/08/2017 12:01    Procedures Procedures (including critical care time)  Medications Ordered in ED Medications  doxycycline (VIBRA-TABS) tablet 100 mg (100 mg Oral Given 07/08/17 2006)     Initial Impression / Assessment and Plan / ED Course  I have reviewed the triage vital signs and the nursing notes.  Pertinent labs & imaging results that were available during my care of the patient were reviewed by me and considered in my medical decision making (see chart for details).     BP (!) 146/96 (BP Location: Right Arm)   Pulse 62   Temp 98.1 F (36.7 C) (Oral)   Resp 16   Ht 5\' 8"  (1.727 m)   Wt 80.7 kg (178 lb)   SpO2 100%   BMI 27.06 kg/m    Final Clinical Impressions(s) / ED Diagnoses   Final diagnoses:  Dysuria  Angina pectoris ([place])  Acute URI    ED Discharge Orders        Ordered    doxycycline (VIBRAMYCIN) 100 MG capsule  2 times daily     07/08/17 2016    benzonatate (TESSALON) 100 MG capsule  Every 8 hours     07/08/17 2016     8:04 PM Patient with multiple comorbidities here with multiple complaints.  He has cold symptoms for more than a week.  Chest x-ray without signs of pneumonia.   Complaining of exertional chest pain for the past several days with history of cardiac disease prior stenting.  EKG, and Dr. troponin without concerning changes.  Complaining of dysuria, urine shows no signs of urinary tract infection.  Given the prolonged duration of his symptoms, I felt patient may benefit from antibiotic doxycycline to cover for potential superimposed infection.  I do not think he has an acute cardiac event at this time.  He otherwise stable for discharge.  He will follow-up with PCP for further care.  Return precautions discussed.   Domenic Moras, PA-C 07/08/17 2017    Nat Christen, MD 07/09/17 [phone]

## 2017-07-08 NOTE — ED Notes (Signed)
Signature pad not working. 

## 2017-07-08 NOTE — ED Notes (Signed)
Pt ambulatory to bathroom

## 2017-07-08 NOTE — ED Notes (Signed)
Pt discharged from ED; instructions provided and scripts given; Pt encouraged to return to ED if symptoms worsen and to f/u with PCP; Pt verbalized understanding of all instructions 

## 2017-07-08 NOTE — Discharge Instructions (Signed)
Please take doxycycline as prescribe to treat for potential bacterial infection causing your symptoms.  Take tessalon as needed for cough.

## 2017-07-08 NOTE — ED Triage Notes (Signed)
Pt arrives via POV from home with substernal chest pain described as burning after exertion. Pt reports the pain has occurred several times in the last 2 days. States recently taken off isosorbide. Pt also reports URI sx and productive green/brown cough. Pt c/o burning with urination. Denies penile,  discharge. Denies recent fever.

## 2017-07-12 ENCOUNTER — Telehealth: Payer: Self-pay | Admitting: Internal Medicine

## 2017-07-12 ENCOUNTER — Other Ambulatory Visit: Payer: Self-pay | Admitting: Family Medicine

## 2017-07-12 NOTE — Telephone Encounter (Signed)
Faxed prior authorization for livalo 4mg  [facility]

## 2017-07-17 ENCOUNTER — Observation Stay (HOSPITAL_COMMUNITY)
Admission: EM | Admit: 2017-07-17 | Discharge: 2017-07-18 | Disposition: A | Discharge status: Home / Self Care | Payer: Medicaid Other | Attending: Family Medicine | Admitting: Family Medicine

## 2017-07-17 ENCOUNTER — Emergency Department (HOSPITAL_COMMUNITY): Payer: Medicaid Other

## 2017-07-17 ENCOUNTER — Other Ambulatory Visit: Payer: Self-pay

## 2017-07-17 ENCOUNTER — Encounter (HOSPITAL_COMMUNITY): Payer: Self-pay | Admitting: Emergency Medicine

## 2017-07-17 DIAGNOSIS — K219 Gastro-esophageal reflux disease without esophagitis: Secondary | ICD-10-CM | POA: Diagnosis not present

## 2017-07-17 DIAGNOSIS — R112 Nausea with vomiting, unspecified: Secondary | ICD-10-CM | POA: Diagnosis not present

## 2017-07-17 DIAGNOSIS — J101 Influenza due to other identified influenza virus with other respiratory manifestations: Secondary | ICD-10-CM | POA: Diagnosis not present

## 2017-07-17 DIAGNOSIS — E872 Acidosis, unspecified: Secondary | ICD-10-CM

## 2017-07-17 DIAGNOSIS — Z79899 Other long term (current) drug therapy: Secondary | ICD-10-CM | POA: Diagnosis not present

## 2017-07-17 DIAGNOSIS — F329 Major depressive disorder, single episode, unspecified: Secondary | ICD-10-CM | POA: Diagnosis not present

## 2017-07-17 DIAGNOSIS — I2510 Atherosclerotic heart disease of native coronary artery without angina pectoris: Secondary | ICD-10-CM | POA: Insufficient documentation

## 2017-07-17 DIAGNOSIS — Z7984 Long term (current) use of oral hypoglycemic drugs: Secondary | ICD-10-CM | POA: Diagnosis not present

## 2017-07-17 DIAGNOSIS — R05 Cough: Secondary | ICD-10-CM

## 2017-07-17 DIAGNOSIS — I252 Old myocardial infarction: Secondary | ICD-10-CM | POA: Insufficient documentation

## 2017-07-17 DIAGNOSIS — Z7982 Long term (current) use of aspirin: Secondary | ICD-10-CM | POA: Diagnosis not present

## 2017-07-17 DIAGNOSIS — Z888 Allergy status to other drugs, medicaments and biological substances status: Secondary | ICD-10-CM | POA: Diagnosis not present

## 2017-07-17 DIAGNOSIS — Z955 Presence of coronary angioplasty implant and graft: Secondary | ICD-10-CM | POA: Diagnosis not present

## 2017-07-17 DIAGNOSIS — R300 Dysuria: Secondary | ICD-10-CM | POA: Diagnosis not present

## 2017-07-17 DIAGNOSIS — Z66 Do not resuscitate: Secondary | ICD-10-CM | POA: Diagnosis not present

## 2017-07-17 DIAGNOSIS — I10 Essential (primary) hypertension: Secondary | ICD-10-CM | POA: Diagnosis not present

## 2017-07-17 DIAGNOSIS — E119 Type 2 diabetes mellitus without complications: Secondary | ICD-10-CM | POA: Insufficient documentation

## 2017-07-17 DIAGNOSIS — Z87891 Personal history of nicotine dependence: Secondary | ICD-10-CM | POA: Diagnosis not present

## 2017-07-17 DIAGNOSIS — E785 Hyperlipidemia, unspecified: Secondary | ICD-10-CM | POA: Insufficient documentation

## 2017-07-17 DIAGNOSIS — R059 Cough, unspecified: Secondary | ICD-10-CM

## 2017-07-17 LAB — URINALYSIS, ROUTINE W REFLEX MICROSCOPIC
BILIRUBIN URINE: NEGATIVE
Glucose, UA: NEGATIVE mg/dL
HGB URINE DIPSTICK: NEGATIVE
KETONES UR: NEGATIVE mg/dL
Leukocytes, UA: NEGATIVE
NITRITE: NEGATIVE
PH: 5 (ref 5.0–8.0)
Protein, ur: NEGATIVE mg/dL
SPECIFIC GRAVITY, URINE: 1.03 (ref 1.005–1.030)

## 2017-07-17 LAB — BASIC METABOLIC PANEL
Anion gap: 12 (ref 5–15)
BUN: 10 mg/dL (ref 6–20)
CALCIUM: 8.9 mg/dL (ref 8.9–10.3)
CHLORIDE: 102 mmol/L (ref 101–111)
CO2: 21 mmol/L — AB (ref 22–32)
CREATININE: 0.84 mg/dL (ref 0.61–1.24)
GFR calc non Af Amer: >60 mL/min (ref >60)
Glucose, Bld: 174 mg/dL — ABNORMAL HIGH (ref 65–99)
Potassium: 4.3 mmol/L (ref 3.5–5.1)
SODIUM: 135 mmol/L (ref 135–145)

## 2017-07-17 LAB — CBC WITH DIFFERENTIAL/PLATELET
BASOS PCT: 0 %
Basophils Absolute: 0 10*3/uL (ref 0.0–0.1)
EOS ABS: 0.1 10*3/uL (ref 0.0–0.7)
Eosinophils Relative: 2 %
HCT: 42.9 % (ref 39.0–52.0)
Hemoglobin: 14.2 g/dL (ref 13.0–17.0)
LYMPHS ABS: 0.8 10*3/uL (ref 0.7–4.0)
Lymphocytes Relative: 14 %
MCH: 28.7 pg (ref 26.0–34.0)
MCHC: 33.1 g/dL (ref 30.0–36.0)
MCV: 86.7 fL (ref 78.0–100.0)
MONO ABS: 0.4 10*3/uL (ref 0.1–1.0)
MONOS PCT: 8 %
Neutro Abs: 4.4 10*3/uL (ref 1.7–7.7)
Neutrophils Relative %: 76 %
Platelets: 170 10*3/uL (ref 150–400)
RBC: 4.95 MIL/uL (ref 4.22–5.81)
RDW: 12.4 % (ref 11.5–15.5)
WBC: 5.8 10*3/uL (ref 4.0–10.5)

## 2017-07-17 LAB — CBG MONITORING, ED: Glucose-Capillary: 92 mg/dL (ref 65–99)

## 2017-07-17 LAB — GLUCOSE, CAPILLARY: Glucose-Capillary: 92 mg/dL (ref 65–99)

## 2017-07-17 LAB — I-STAT TROPONIN, ED: Troponin i, poc: 0.01 ng/mL (ref 0.00–0.08)

## 2017-07-17 LAB — STREP PNEUMONIAE URINARY ANTIGEN: STREP PNEUMO URINARY ANTIGEN: NEGATIVE

## 2017-07-17 LAB — PROCALCITONIN

## 2017-07-17 LAB — I-STAT CG4 LACTIC ACID, ED
LACTIC ACID, VENOUS: 3.12 mmol/L — AB (ref 0.5–1.9)
Lactic Acid, Venous: 2.18 mmol/L (ref 0.5–1.9)
Lactic Acid, Venous: 2.85 mmol/L (ref 0.5–1.9)
Lactic Acid, Venous: 1.37 mmol/L (ref 0.5–1.9)

## 2017-07-17 LAB — BRAIN NATRIURETIC PEPTIDE: B NATRIURETIC PEPTIDE 5: 8.4 pg/mL (ref 0.0–100.0)

## 2017-07-17 LAB — INFLUENZA PANEL BY PCR (TYPE A & B)
INFLBPCR: NEGATIVE
Influenza A By PCR: POSITIVE — AB

## 2017-07-17 MED ORDER — ONDANSETRON HCL 4 MG/2ML IJ SOLN
4.0000 mg | Freq: Once | INTRAMUSCULAR | Status: DC
  Filled 2017-07-17: qty 2

## 2017-07-17 MED ORDER — NITROGLYCERIN 0.4 MG SL SUBL: 0.4000 mg | SUBLINGUAL_TABLET | SUBLINGUAL | Status: DC | PRN

## 2017-07-17 MED ORDER — GABAPENTIN 100 MG PO CAPS
100.0000 mg | ORAL_CAPSULE | Freq: Three times a day (TID) | ORAL | Status: DC
Start: 2017-07-17 — End: 2017-07-18
  Administered 2017-07-17 – 2017-07-18 (×3): 100 mg via ORAL
  Filled 2017-07-17 (×3): qty 1

## 2017-07-17 MED ORDER — DM-GUAIFENESIN ER 30-600 MG PO TB12
1.0000 | ORAL_TABLET | Freq: Two times a day (BID) | ORAL | Status: DC
  Administered 2017-07-17 – 2017-07-18 (×2): 1 via ORAL
  Filled 2017-07-17 (×2): qty 1

## 2017-07-17 MED ORDER — SODIUM CHLORIDE 0.9 % IV BOLUS (SEPSIS)
1000.0000 mL | Freq: Once | INTRAVENOUS | Status: AC
  Administered 2017-07-17: 1000 mL via INTRAVENOUS

## 2017-07-17 MED ORDER — LISINOPRIL 10 MG PO TABS
10.0000 mg | ORAL_TABLET | Freq: Every day | ORAL | Status: DC
  Administered 2017-07-17 – 2017-07-18 (×2): 10 mg via ORAL
  Filled 2017-07-17 (×2): qty 1

## 2017-07-17 MED ORDER — CARVEDILOL 12.5 MG PO TABS
12.5000 mg | ORAL_TABLET | Freq: Two times a day (BID) | ORAL | Status: DC
  Administered 2017-07-17 – 2017-07-18 (×2): 12.5 mg via ORAL
  Filled 2017-07-17 (×2): qty 1

## 2017-07-17 MED ORDER — SODIUM CHLORIDE 0.9 % IV BOLUS (SEPSIS): 500.0000 mL | Freq: Once | INTRAVENOUS | Status: DC

## 2017-07-17 MED ORDER — BENZONATATE 100 MG PO CAPS
100.0000 mg | ORAL_CAPSULE | Freq: Three times a day (TID) | ORAL | Status: DC
  Filled 2017-07-17: qty 1

## 2017-07-17 MED ORDER — ARIPIPRAZOLE 2 MG PO TABS
2.0000 mg | ORAL_TABLET | Freq: Every day | ORAL | Status: DC
  Administered 2017-07-17 – 2017-07-18 (×2): 2 mg via ORAL
  Filled 2017-07-17 (×2): qty 1

## 2017-07-17 MED ORDER — OSELTAMIVIR PHOSPHATE 75 MG PO CAPS
75.0000 mg | ORAL_CAPSULE | Freq: Once | ORAL | Status: AC
  Administered 2017-07-17: 75 mg via ORAL
  Filled 2017-07-17: qty 1

## 2017-07-17 MED ORDER — TRAMADOL HCL 50 MG PO TABS
50.0000 mg | ORAL_TABLET | Freq: Four times a day (QID) | ORAL | Status: DC | PRN
  Administered 2017-07-17: 50 mg via ORAL
  Filled 2017-07-17: qty 1

## 2017-07-17 MED ORDER — ASPIRIN EC 81 MG PO TBEC
81.0000 mg | DELAYED_RELEASE_TABLET | Freq: Every day | ORAL | Status: DC
  Administered 2017-07-17 – 2017-07-18 (×2): 81 mg via ORAL
  Filled 2017-07-17 (×2): qty 1

## 2017-07-17 MED ORDER — ACETAMINOPHEN 325 MG PO TABS
650.0000 mg | ORAL_TABLET | Freq: Four times a day (QID) | ORAL | Status: DC
  Administered 2017-07-17 – 2017-07-18 (×4): 650 mg via ORAL
  Filled 2017-07-17 (×4): qty 2

## 2017-07-17 MED ORDER — KETOROLAC TROMETHAMINE 30 MG/ML IJ SOLN
30.0000 mg | Freq: Once | INTRAMUSCULAR | Status: DC
Start: 2017-07-17 — End: 2017-07-17
  Filled 2017-07-17: qty 1

## 2017-07-17 MED ORDER — ACETAMINOPHEN 650 MG RE SUPP: 650.0000 mg | Freq: Four times a day (QID) | RECTAL | Status: DC

## 2017-07-17 MED ORDER — ACETAMINOPHEN 500 MG PO TABS
1000.0000 mg | ORAL_TABLET | Freq: Once | ORAL | Status: AC
  Administered 2017-07-17: 1000 mg via ORAL
  Filled 2017-07-17: qty 2

## 2017-07-17 MED ORDER — OSELTAMIVIR PHOSPHATE 75 MG PO CAPS
75.0000 mg | ORAL_CAPSULE | Freq: Two times a day (BID) | ORAL | Status: DC
  Administered 2017-07-17 – 2017-07-18 (×2): 75 mg via ORAL
  Filled 2017-07-17 (×2): qty 1

## 2017-07-17 MED ORDER — MIRTAZAPINE 30 MG PO TABS
30.0000 mg | ORAL_TABLET | Freq: Every day | ORAL | Status: DC
  Administered 2017-07-17: 30 mg via ORAL
  Filled 2017-07-17: qty 1

## 2017-07-17 MED ORDER — ENOXAPARIN SODIUM 40 MG/0.4ML ~~LOC~~ SOLN
40.0000 mg | SUBCUTANEOUS | Status: DC
  Filled 2017-07-17: qty 0.4

## 2017-07-17 MED ORDER — INSULIN ASPART 100 UNIT/ML ~~LOC~~ SOLN
0.0000 [IU] | Freq: Three times a day (TID) | SUBCUTANEOUS | Status: DC
Start: 2017-07-17 — End: 2017-07-18
  Administered 2017-07-18 (×2): 2 [IU] via SUBCUTANEOUS

## 2017-07-17 NOTE — ED Notes (Signed)
Ulyess Mort, Lab Tech, advised will add BNP but unable to add HIV to labs d/t no gold top. Pt arrived to [facility] - ambulatory, alert, oriented. Pt voided in bathroom and back to room. Pt noted to be wearing hospital gown and black pants. Pt requesting paper scrubs. States would feel more comfortable.

## 2017-07-17 NOTE — H&P (Signed)
[facility] Admission History and Physical Service Pager: [phone]  Patient name: [name] Medical record number: [id number] Date of birth: October 19, 1960 Age: 57 y.o. Gender: male  Primary Care Provider: Scot Jun, FNP Consultants: none Code Status: DNR (obtained on admission)  Chief Complaint: cough, dysuria  Assessment and Plan:  [name] is a 57 y.o. male presenting with 3 week history of cough and congestion. Positive for flu A. Also with 1 week history of dysuria.  Influenza A  cough  congestion Patient with slowly progressive cough lasting for 3 weeks. Has tried tessalon perles and has received doxycycline without any improvement. Tested positive for influenza A. Lungs clear to ausculation and chest xray clear. EKG normal sinus rhythm. Lactic elevated initially up to 3.12. S/P 2 L ns bolus, LA down to 1.37. Unclear if flu could explain the long time-course of patient symptoms. Possible other URI contributing and have possibly overlapped creating long course of symptoms especially with patient being around sick contacts regularly. Regardless it seems very unlikely that he has a bacterial pneumonia at this point. Other items on differential could be atypical pneumonia although the cxr does not suggest this. Patient could be at risk for tuberculosis given his time spent at homeless shelters but I have a low suspicion for this given how benign the rest of his symptoms are. Will admit for observation, he has improved from a lab standpoint with fluids. - admit for observation, Dr McDiarmid, appropriate for medsurg - vital signs per floor routine  - cbc, cmp in am - tylenol for pain and fever - droplet precautions - heart healthy/carb-modified diet - DNR/DNI per patient request - tamiflu 75mg  twice a day - Mucinex-DM every 12 hours  Lactic acidosis: likely due to dehydration from nausea and vomiting. Resolved with IV fluid in ED. CXR normal.  No other sources of infection. Procalcitonin negative. No leukocytosis  Dysuria Unclear what is causing his dysuria. No apparent distress during GU exam. Says that it is the very tip of his penis that is hurting. No history of kidney stones. No pain with ADLs, only with urination. Has not had sexual intercourse in 5 months. UA negative for abnormality. Most likely diagnoses remain std vs kidney stone vs possible trauma. Based on history kidney stone and std are unlikely, based on exam trauma unlikely. - pain control - will send for GC/Chlamydia - continue to follow symptoms  T2DM Patient with A1C of 6.1. On metformin. Will hold while in hospital.  -Will place on sSSI. -CBG ACHS  HLD Cholesterol of 195 in lipid panel in 11/2016. TG 87, HDL 46, LDL 132. Apparently has a statin allergy. Could likely get benefit from reduced statin dosing (every other day) or PCSK9 given his cardiac history.  MDD: stable. Denies SI/HI Has a very long and detailed history of MDD. Well managed on abilify and remeron. Will continue these while inpatient  HTN On coreg and Lisinopril as outpatient. bp 117/60 at last check. Continue coreg and lisinopril.  CAD: stable. No anginal symptoms or signs of fluid overload. Initial EKG without acute ischemic finding. S/P multiple stents. Doing well from a cardiology standpoint. On coreg and lisinopril, nitrostat as needed. Continue coreg, nitro.  FEN/GI: carb-modified, heart healthy diet  Prophylaxis: lovenox  Disposition: admit for observation. Likely D/c 01/20.  History of Present Illness:  [name] is a 57 y.o. male presenting with 3 week history of progressive cough and dysuria. He states that he first  developed a dry cough around 3 weeks ago, that was non-productive. He does not endorse any fevers during this time period. He states that the cough has slowly gotten worse since its inception 3 weeks ago. He has really had no accompanying symptoms aside from  post-tussive emesis starting 1/18, and dysuria which started around 1 week ago. He states that he spends a lot of time in a homeless shelter and there "has been a lot of coughing going around". He was seen in the ED on 1/10. Had a clear chest xray, and unremarkable labwork. Was given a course of doxycycline at that time. He states that this has had no effect on his coughing.  Patient also complains of dysuria. Started around one week ago and started out as an ache with urination. It has evolved to a stabbing, sharp pain at the tip of his penis every time he urinates. He states that he can always feel it but it is much less intense when he is not urinating. His last reported sexual intercourse was 5 months ago. He has had no trauma to that area. No blood in his urine. No trouble with stream, able to go on command. As above the patient received doxycyline on 1/10. Never had a kidney stone before.  Workup in the ED. Consisted of cmp, cbc, lactic acid, BNP, troponin, UA, chest xray, ekg. Patient found to be flu A positive. Received 2 L of NS and tamiflu in the ed.  Review Of Systems: Per HPI with the following additions:   Review of Systems  Constitutional: Negative for chills and fever.  HENT: Positive for congestion. Negative for ear discharge and ear pain.   Eyes: Negative for pain and discharge.  Respiratory: Positive for cough. Negative for hemoptysis, sputum production, shortness of breath and wheezing.   Cardiovascular: Negative for chest pain, palpitations, orthopnea and claudication.  Gastrointestinal: Positive for vomiting. Negative for abdominal pain, constipation, diarrhea and nausea.  Genitourinary: Positive for dysuria. Negative for frequency, hematuria and urgency.  Musculoskeletal: Negative for myalgias.  Neurological: Negative for dizziness, weakness and headaches.    Patient Active Problem List   Diagnosis Date Noted  . Influenza A 07/17/2017  . Severe episode of recurrent major  depressive disorder, without psychotic features ([place])   . MDD (major depressive disorder), single episode, severe with psychotic features ([place]) 06/27/2017  . MDD (major depressive disorder), severe ([place]) 06/23/2017  . Essential hypertension 01/13/2016  . Coronary artery disease involving native coronary artery of native heart without angina pectoris 01/13/2016  . Unstable angina ([place])   . ED (erectile dysfunction) 02/15/2014  . Allergy to contrast media (used for diagnostic x-rays) 05/12/2011  . Hypertension 05/09/2011  . Hyperlipidemia 05/09/2011  . DM2 (diabetes mellitus, type 2) ([place]) 07/10/2010  . CAD S/P percutaneous coronary angioplasty 07/10/2010  . DYSPEPSIA&OTHER [facility] DISORDERS FUNCTION STOMACH 07/10/2010    Past Medical History: Past Medical History:  Diagnosis Date  . Arthritis   . Chronic lower back pain   . Coronary artery disease    a. Multiple prior caths/PCI. Cath 2013 with possible spasm of RCA, 70% ISR of mid LCx with subsequent DES to mLCx and prox LCX. b. H/o microvascular angina. c. Recurrent angina 08/2014 - s/p PTCA/DES to prox Cx, PTCA/CBA to OM1.  c. LHC 06/10/15 with patent stents and some ISR in LCX and OM-1 that was not flow limiting --> Rx   . Dyslipidemia    a. Intolerant to many statins except tolerating Livalo.  Marland Kitchen GERD (  gastroesophageal reflux disease)   . Hypertension   . Myocardial infarction ([place]) ~ 2010  . S/P angioplasty with stent, DES, to proximal and mid LCX 12/15/11 12/15/2011  . Type II diabetes mellitus ([place])     Past Surgical History: Past Surgical History:  Procedure Laterality Date  . CARDIAC CATHETERIZATION  06/15/2002   LAD with prox 40% stenosis, norma L main, Cfx with 25% lesion, RCA with long mid 25% stenosis (Dr. Vita Barley)  . CARDIAC CATHETERIZATION  04/01/2010   normal L main, LAD wit mild stenosis, L Cfx with 70% in-stent restenosis, RCA with 70% in-stent restenosis, LVEF >60% (Dr. K. Mali Hilty) - cutting ballon arthrectomy to  RCA & Cfx (Dr. Rockne Menghini)  . CARDIAC CATHETERIZATION  08/25/2010   preserved global LV contractility; multivessel CAD, diffuse 90-95% in-stent restenosis in prox placed Cfx stent - cutting balloon arthrectomy in Cfx with multiple dilatations 90-95% to 0% stenosis (Dr. Corky Downs)  . CARDIAC CATHETERIZATION  01/26/2011   PCI & stenting of aggresive in-stent restenosis within previously stented AV groove Cfx with 3.0x23mm Taxus DES (previous stents were Promus) (Dr. Adora Fridge)  . CARDIAC CATHETERIZATION  05/11/2011   preserved LV function, 40% mid LAD stenosis, 30-40% narrowing proximal to stented semgnet of prox Cfx, patent mid RCA stent with smooth 20% narrowing in distal RCA (Dr. Corky Downs)  . CARDIAC CATHETERIZATION  12/15/2011   PCI & stenting of proximal & mid Cfx with DES - 3.0x45mm in proximal, 3.0x37mm in mid (Dr. Adora Fridge)  . CARDIAC CATHETERIZATION N/A 06/10/2015   Procedure: Left Heart Cath and Coronary Angiography;  Surgeon: Jolaine Artist, MD;  Location: [facility];  Service: Cardiovascular;  Laterality: N/A;  . cardiometabolic testing  11/14/8414   good exercise effort, peak VO2 79% predicted with normal VO2 HR curves (mild deconditioning)  . EXCISIONAL HEMORRHOIDECTOMY  1984  . LEFT HEART CATHETERIZATION WITH CORONARY ANGIOGRAM N/A 05/11/2011   Procedure: LEFT HEART CATHETERIZATION WITH CORONARY ANGIOGRAM;  Surgeon: Troy Sine, MD;  Location: [facility] CATH LAB;  Service: Cardiovascular;  Laterality: N/A;  Possible percutaneous coronary intervention, possible IVUS  . LEFT HEART CATHETERIZATION WITH CORONARY ANGIOGRAM N/A 12/15/2011   Procedure: LEFT HEART CATHETERIZATION WITH CORONARY ANGIOGRAM;  Surgeon: Lorretta Harp, MD;  Location: [facility] CATH LAB;  Service: Cardiovascular;  Laterality: N/A;  . LEFT HEART CATHETERIZATION WITH CORONARY ANGIOGRAM N/A 09/05/2014   Procedure: LEFT HEART CATHETERIZATION WITH CORONARY ANGIOGRAM;  Surgeon: Burnell Blanks, MD;  Location: [facility] CATH LAB;   Service: Cardiovascular;  Laterality: N/A;  . LIPOMA EXCISION     back of the head  . NM MYOCAR PERF WALL MOTION  02/2012   lexiscan myoview; mild perfusion defect in mid inferolateral & basal inferolateral region (infarct/scar); EF 52%, abnormal but ow risk scan  . PERCUTANEOUS CORONARY STENT INTERVENTION (PCI-S)  09/05/2014   Procedure: PERCUTANEOUS CORONARY STENT INTERVENTION (PCI-S);  Surgeon: Burnell Blanks, MD;  Location: [facility] CATH LAB;  Service: Cardiovascular;;    Social History: Social History   Tobacco Use  . Smoking status: Former Smoker    Packs/day: 1.00    Years: 10.00    Pack years: 10.00    Types: Cigarettes    Last attempt to quit: 04/07/2009    Years since quitting: 8.2  . Smokeless tobacco: Never Used  Substance Use Topics  . 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 Drug use: No  Comment: 12/15/11 "last cocaine was 2010"    Family History: Family History  Problem Relation Age of Onset  . Leukemia Mother   . Prostate cancer Father   . Coronary artery disease Paternal Grandmother   . Cancer Paternal Grandfather   . Cancer Brother     Allergies and Medications: Allergies  Allergen Reactions  . Bee Venom Anaphylaxis and Hives  . Shellfish Allergy Anaphylaxis and Hives  . Statins Other (See Comments)    Myalgias. Tolerating livalo.   . Testosterone Cypionate     Testerone Injection --Increased breast tissue    No current facility-administered medications on file prior to encounter.    Current Outpatient Medications on File Prior to Encounter  Medication Sig Dispense Refill  . ARIPiprazole (ABILIFY) 2 MG tablet Take 1 tablet (2 mg total) by mouth daily. For mood control 30 tablet 0  . aspirin EC 81 MG tablet Take 1 tablet (81 mg total) by mouth daily. 90 tablet 3  . carvedilol (COREG) 12.5 MG tablet Take 2 tablets (25 mg total) by mouth daily. (Patient taking differently: Take 12.5 mg by mouth 2 (two)  times daily with a meal. ) 90 tablet 1  . COENZYME Q10 PO Take 30 mg by mouth daily.    Marland Kitchen gabapentin (NEURONTIN) 100 MG capsule Take 1 capsule (100 mg total) by mouth 3 (three) times daily. 90 capsule 0  . lisinopril (PRINIVIL,ZESTRIL) 10 MG tablet Take 1 tablet (10 mg total) by mouth daily. For high blood pressure 30 tablet 0  . metFORMIN (GLUCOPHAGE-XR) 500 MG 24 hr tablet Take 1 tablet (500 mg total) by mouth daily with breakfast. 90 tablet 1  . mirtazapine (REMERON) 30 MG tablet Take 1 tablet (30 mg total) by mouth at bedtime. For mood control 30 tablet 0  . Omega-3 Fatty Acids (FISH OIL) 600 MG CAPS Take 1,200 mg by mouth daily.     . traMADol (ULTRAM) 50 MG tablet Take 50 mg by mouth every 6 (six) hours as needed for moderate pain.     . benzonatate (TESSALON) 100 MG capsule Take 1 capsule (100 mg total) by mouth every 8 (eight) hours. (Patient not taking: Reported on 07/17/2017) 21 capsule 0  . lidocaine (LIDODERM) 5 % Place 1 patch onto the skin daily. Remove & Discard patch within 12 hours or as directed by MD (Patient not taking: Reported on 07/17/2017) 15 patch 0  . metFORMIN (GLUCOPHAGE) 500 MG tablet TAKE 1 TABLET BY MOUTH  DAILY WITH  BREAKFAST (Patient not taking: Reported on 07/17/2017) 30 tablet 0  . nitroGLYCERIN (NITROSTAT) 0.4 MG SL tablet Place 1 tablet (0.4 mg total) under the tongue every 5 (five) minutes as needed. For chest pain 25 tablet 3    Objective: BP 117/60 (BP Location: Left Arm)   Pulse 96   Temp 99.6 F (37.6 C) (Oral)   Resp 20   Ht 5\' 8"  (1.727 m)   Wt 178 lb (80.7 kg)   SpO2 96%   BMI 27.06 kg/m  Exam: General: alert, oriented, no acute distress. Had a prolonged coughing fit while in the room, but no coughing otherwise Eyes: eomi, perrl, no conjunctive injection ENTM: no external signs of trauma Bilateral ears an nose Neck: full range of motion, no cervical adenopathy Cardiovascular: rrr, no m/r/g. Palpable radial pulse bilaterally, palpable pt/dp  bilaterally Respiratory: lungs clear to ausculation bilaterally, symmetric chest rise Gastrointestinal: soft, non-tender, non-distended MSK: 5/5 strength BLE, BUE. Derm: warm, dry Neuro: aox3, cn 2-12 intact,  no focal neuro deficit Psych: appropriate GU: circumcised, normal appearing male genitalia. No signs of trauma, or erythema at the tip of his penis where the pain is primarily located. Penile meatus patent and without lesion or scarring, No tender to scrotal palpation.   Labs and Imaging: CBC BMET  Recent Labs  Lab 07/17/17 0445  WBC 5.8  HGB 14.2  HCT 42.9  PLT 170   Recent Labs  Lab 07/17/17 0445  NA 135  K 4.3  CL 102  CO2 21*  BUN 10  CREATININE 0.84  GLUCOSE 174*  CALCIUM 8.9      Guadalupe Dawn, MD 07/17/2017, 5:54 PM PGY-1, [facility] pager: [phone], text pages welcome  I have seen and evaluated the patient with Dr. Kris Mouton. I am in agreement with the note above in its revised form. My additions are in red.  Wendee Beavers, MD, PGY-3 07/17/2017 10:24 PM

## 2017-07-17 NOTE — ED Notes (Signed)
Lactic acid  Give to Dr. Regenia Skeeter

## 2017-07-17 NOTE — ED Notes (Signed)
Got patient into a gown patient is resting with call bell in reach 

## 2017-07-17 NOTE — ED Notes (Signed)
Admitting MDs at bedside.

## 2017-07-17 NOTE — ED Notes (Signed)
Pt being escorted to floor via w/c. Pt has his black backpack w/him.

## 2017-07-17 NOTE — ED Triage Notes (Signed)
The patient was treated her for UTI and flu symptoms.  He was discharged with Doxycycline.  The patient said he has gotten worse so he called EMS.

## 2017-07-17 NOTE — ED Notes (Signed)
Crackers and Diet Ginger Ale given as requested.

## 2017-07-17 NOTE — ED Provider Notes (Signed)
Patient placed in Quick Look pathway, seen and evaluated   Chief Complaint: Cough  HPI:   Patient flu-like symptoms.  Reports cough, body aches, fevers, chills, and dysuria.  Seen here recently for the same, treated with doxy. Reports worsening symptoms.  Endorses chest pain with cough only.  Productive cough.    ROS: fever, chills, cough, dysuria, generalized body aches  Physical Exam:  PE:Gen: A&O x4 HEENT: PERRL, EOM intact CHEST: RRR, no m/r/g LUNGS: CTAB, no w/r/r ABD: BS x 4, ND/NT EXT: No edema, strong peripheral pulses NEURO: Sensation and strength intact bilaterally    Initiation of care has begun. The patient has been counseled on the process, plan, and necessity for staying for the completion/evaluation, and the remainder of the medical screening examination    Montine Circle, Hershal Coria 07/17/17 [place], [place], MD 07/22/17 [phone]

## 2017-07-17 NOTE — ED Notes (Signed)
Patient transported to X-ray 

## 2017-07-17 NOTE — ED Notes (Signed)
CBG- 92 

## 2017-07-17 NOTE — Progress Notes (Signed)
Pt arrived to 6N02 from ED.  Pt alert, oriented, on droplet precautions for positive A flu.  Oriented to dept and room.

## 2017-07-17 NOTE — ED Provider Notes (Signed)
[facility] EMERGENCY DEPARTMENT Provider Note   CSN: [id number] Arrival date & time: 07/17/17  0446     History   Chief Complaint Chief Complaint  Patient presents with  . Cough  . Fever    HPI [name] is a 57 y.o. male with history of CAD, hypertension, type 2 diabetes who presents with a 3-week history of cough, body aches, fever, and 1 day history of nausea and vomiting patient denies any diarrhea or abdominal pain.  Patient was evaluated on 07/08/2016 and thought to have flulike symptoms.  No flu tested at that time.  Patient also reports a 3-week history of dysuria.  He denies any hematuria or urinary frequency.  He denies any concern for STD exposure.  Patient was discharged home with doxycycline at last visit, which he completed, however continues to have worsening symptoms.  He has been able to tolerate fluids.  He states his nausea vomiting does not seem to be associated with eating.  He has had some shortness of breath and chest pain with coughing.  HPI  Past Medical History:  Diagnosis Date  . Arthritis   . Chronic lower back pain   . Coronary artery disease    a. Multiple prior caths/PCI. Cath 2013 with possible spasm of RCA, 70% ISR of mid LCx with subsequent DES to mLCx and prox LCX. b. H/o microvascular angina. c. Recurrent angina 08/2014 - s/p PTCA/DES to prox Cx, PTCA/CBA to OM1.  c. LHC 06/10/15 with patent stents and some ISR in LCX and OM-1 that was not flow limiting --> Rx   . Dyslipidemia    a. Intolerant to many statins except tolerating Livalo.  Marland Kitchen GERD (gastroesophageal reflux disease)   . Hypertension   . Myocardial infarction ([place]) ~ 2010  . S/P angioplasty with stent, DES, to proximal and mid LCX 12/15/11 12/15/2011  . Type II diabetes mellitus [facility])     Patient Active Problem List   Diagnosis Date Noted  . Influenza A 07/17/2017  . Severe episode of recurrent major depressive disorder, without psychotic features ([place])   . MDD  (major depressive disorder), single episode, severe with psychotic features ([place]) 06/27/2017  . MDD (major depressive disorder), severe ([place]) 06/23/2017  . Essential hypertension 01/13/2016  . Coronary artery disease involving native coronary artery of native heart without angina pectoris 01/13/2016  . Unstable angina ([place])   . ED (erectile dysfunction) 02/15/2014  . Allergy to contrast media (used for diagnostic x-rays) 05/12/2011  . Hypertension 05/09/2011  . Hyperlipidemia 05/09/2011  . DM2 (diabetes mellitus, type 2) ([place]) 07/10/2010  . CAD S/P percutaneous coronary angioplasty 07/10/2010  . DYSPEPSIA&OTHER [facility] DISORDERS FUNCTION STOMACH 07/10/2010    Past Surgical History:  Procedure Laterality Date  . CARDIAC CATHETERIZATION  06/15/2002   LAD with prox 40% stenosis, norma L main, Cfx with 25% lesion, RCA with long mid 25% stenosis (Dr. Vita Barley)  . CARDIAC CATHETERIZATION  04/01/2010   normal L main, LAD wit mild stenosis, L Cfx with 70% in-stent restenosis, RCA with 70% in-stent restenosis, LVEF >60% (Dr. K. Mali Hilty) - cutting ballon arthrectomy to RCA & Cfx (Dr. Rockne Menghini)  . CARDIAC CATHETERIZATION  08/25/2010   preserved global LV contractility; multivessel CAD, diffuse 90-95% in-stent restenosis in prox placed Cfx stent - cutting balloon arthrectomy in Cfx with multiple dilatations 90-95% to 0% stenosis (Dr. Corky Downs)  . CARDIAC CATHETERIZATION  01/26/2011   PCI & stenting of aggresive in-stent restenosis within previously stented  AV groove Cfx with 3.0x49mm Taxus DES (previous stents were Promus) (Dr. Adora Fridge)  . CARDIAC CATHETERIZATION  05/11/2011   preserved LV function, 40% mid LAD stenosis, 30-40% narrowing proximal to stented semgnet of prox Cfx, patent mid RCA stent with smooth 20% narrowing in distal RCA (Dr. Corky Downs)  . CARDIAC CATHETERIZATION  12/15/2011   PCI & stenting of proximal & mid Cfx with DES - 3.0x70mm in proximal, 3.0x73mm in mid (Dr. Adora Fridge)  .  CARDIAC CATHETERIZATION N/A 06/10/2015   Procedure: Left Heart Cath and Coronary Angiography;  Surgeon: Jolaine Artist, MD;  Location: [facility] LAB;  Service: Cardiovascular;  Laterality: N/A;  . cardiometabolic testing  02/24/9370   good exercise effort, peak VO2 79% predicted with normal VO2 HR curves (mild deconditioning)  . EXCISIONAL HEMORRHOIDECTOMY  1984  . LEFT HEART CATHETERIZATION WITH CORONARY ANGIOGRAM N/A 05/11/2011   Procedure: LEFT HEART CATHETERIZATION WITH CORONARY ANGIOGRAM;  Surgeon: Troy Sine, MD;  Location: [facility] CATH LAB;  Service: Cardiovascular;  Laterality: N/A;  Possible percutaneous coronary intervention, possible IVUS  . LEFT HEART CATHETERIZATION WITH CORONARY ANGIOGRAM N/A 12/15/2011   Procedure: LEFT HEART CATHETERIZATION WITH CORONARY ANGIOGRAM;  Surgeon: Lorretta Harp, MD;  Location: [facility] CATH LAB;  Service: Cardiovascular;  Laterality: N/A;  . LEFT HEART CATHETERIZATION WITH CORONARY ANGIOGRAM N/A 09/05/2014   Procedure: LEFT HEART CATHETERIZATION WITH CORONARY ANGIOGRAM;  Surgeon: Burnell Blanks, MD;  Location: [facility] LAB;  Service: Cardiovascular;  Laterality: N/A;  . LIPOMA EXCISION     back of the head  . NM MYOCAR PERF WALL MOTION  02/2012   lexiscan myoview; mild perfusion defect in mid inferolateral & basal inferolateral region (infarct/scar); EF 52%, abnormal but ow risk scan  . PERCUTANEOUS CORONARY STENT INTERVENTION (PCI-S)  09/05/2014   Procedure: PERCUTANEOUS CORONARY STENT INTERVENTION (PCI-S);  Surgeon: Burnell Blanks, MD;  Location: [facility] CATH LAB;  Service: Cardiovascular;;       Home Medications    Prior to Admission medications   Medication Sig Start Date End Date Taking? Authorizing Provider  ARIPiprazole (ABILIFY) 2 MG tablet Take 1 tablet (2 mg total) by mouth daily. For mood control 06/29/17  Yes Money, Lowry Ram, FNP  aspirin EC 81 MG tablet Take 1 tablet (81 mg total) by mouth daily. 01/18/17  Yes Hilty, Nadean Corwin,  MD  carvedilol (COREG) 12.5 MG tablet Take 2 tablets (25 mg total) by mouth daily. Patient taking differently: Take 12.5 mg by mouth 2 (two) times daily with a meal.  03/02/17  Yes Scot Jun, FNP  COENZYME Q10 PO Take 30 mg by mouth daily.   Yes [provider]  gabapentin (NEURONTIN) 100 MG capsule Take 1 capsule (100 mg total) by mouth 3 (three) times daily. 06/28/17  Yes Money, Lowry Ram, FNP  lisinopril (PRINIVIL,ZESTRIL) 10 MG tablet Take 1 tablet (10 mg total) by mouth daily. For high blood pressure 06/29/17  Yes Money, Lowry Ram, FNP  metFORMIN (GLUCOPHAGE-XR) 500 MG 24 hr tablet Take 1 tablet (500 mg total) by mouth daily with breakfast. 01/12/17  Yes Scot Jun, FNP  mirtazapine (REMERON) 30 MG tablet Take 1 tablet (30 mg total) by mouth at bedtime. For mood control 06/28/17  Yes Money, Lowry Ram, FNP  Omega-3 Fatty Acids (FISH OIL) 600 MG CAPS Take 1,200 mg by mouth daily.    Yes [provider]  traMADol (ULTRAM) 50 MG tablet Take 50 mg by mouth every 6 (six) hours as  needed for moderate pain.    Yes [provider]  benzonatate (TESSALON) 100 MG capsule Take 1 capsule (100 mg total) by mouth every 8 (eight) hours. Patient not taking: Reported on 07/17/2017 07/08/17   Domenic Moras, PA-C  lidocaine (LIDODERM) 5 % Place 1 patch onto the skin daily. Remove & Discard patch within 12 hours or as directed by MD Patient not taking: Reported on 07/17/2017 06/28/17   Money, Lowry Ram, FNP  metFORMIN (GLUCOPHAGE) 500 MG tablet TAKE 1 TABLET BY MOUTH  DAILY WITH  BREAKFAST Patient not taking: Reported on 07/17/2017 07/12/17   Scot Jun, FNP  nitroGLYCERIN (NITROSTAT) 0.4 MG SL tablet Place 1 tablet (0.4 mg total) under the tongue every 5 (five) minutes as needed. For chest pain 08/28/13   Pixie Casino, MD    Family History Family History  Problem Relation Age of Onset  . Leukemia Mother   . Prostate cancer Father   . Coronary artery disease Paternal  Grandmother   . Cancer Paternal Grandfather   . Cancer Brother     Social History Social History   Tobacco Use  . Smoking status: Former Smoker    Packs/day: 1.00    Years: 10.00    Pack years: 10.00    Types: Cigarettes    Last attempt to quit: 04/07/2009    Years since quitting: 8.2  . Smokeless tobacco: Never Used  Substance Use Topics  . 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 Drug use: No    Comment: 12/15/11 "last cocaine was 2010"     Allergies   Bee venom; Shellfish allergy; Statins; and Testosterone cypionate   Review of Systems Review of Systems  Constitutional: Positive for fever. Negative for chills.  HENT: Positive for congestion. Negative for facial swelling and sore throat.   Respiratory: Positive for cough and shortness of breath.   Cardiovascular: Positive for chest pain.  Gastrointestinal: Positive for nausea and vomiting. Negative for abdominal pain.  Genitourinary: Positive for dysuria. Negative for discharge, hematuria, penile pain, penile swelling, scrotal swelling and testicular pain.  Musculoskeletal: Positive for myalgias. Negative for back pain.  Skin: Negative for rash and wound.  Neurological: Negative for headaches.  Psychiatric/Behavioral: The patient is not nervous/anxious.      Physical Exam Updated Vital Signs BP (!) 142/96   Pulse (!) 103   Temp (!) 102 F (38.9 C) (Oral)   Resp 18   Ht 5\' 8"  (1.727 m)   Wt 80.7 kg (178 lb)   SpO2 97%   BMI 27.06 kg/m   Physical Exam  Constitutional: He appears well-developed and well-nourished. No distress.  HENT:  Head: Normocephalic and atraumatic.  Mouth/Throat: Oropharynx is clear and moist. No oropharyngeal exudate.  Eyes: Conjunctivae are normal. Pupils are equal, round, and reactive to light. Right eye exhibits no discharge. Left eye exhibits no discharge. No scleral icterus.  Neck: Normal range of motion. Neck supple. No thyromegaly  present.  Cardiovascular: Normal rate, regular rhythm, normal heart sounds and intact distal pulses. Exam reveals no gallop and no friction rub.  No murmur heard. Pulmonary/Chest: Effort normal and breath sounds normal. No stridor. No respiratory distress. He has no wheezes. He has no rales.  Abdominal: Soft. Bowel sounds are normal. He exhibits no distension. There is no tenderness. There is no rebound and no guarding.  Musculoskeletal: He exhibits no edema.  Lymphadenopathy:    He has no cervical  adenopathy.  Neurological: He is alert. Coordination normal.  Skin: Skin is warm and dry. No rash noted. He is not diaphoretic. No pallor.  Psychiatric: He has a normal mood and affect.  Nursing note and vitals reviewed.    ED Treatments / Results  Labs (all labs ordered are listed, but only abnormal results are displayed) Labs Reviewed  BASIC METABOLIC PANEL - Abnormal; Notable for the following components:      Result Value   CO2 21 (*)    Glucose, Bld 174 (*)    All other components within normal limits  INFLUENZA PANEL BY PCR (TYPE A & B) - Abnormal; Notable for the following components:   Influenza A By PCR POSITIVE (*)    All other components within normal limits  I-STAT CG4 LACTIC ACID, ED - Abnormal; Notable for the following components:   Lactic Acid, Venous 2.18 (*)    All other components within normal limits  I-STAT CG4 LACTIC ACID, ED - Abnormal; Notable for the following components:   Lactic Acid, Venous 3.12 (*)    All other components within normal limits  I-STAT CG4 LACTIC ACID, ED - Abnormal; Notable for the following components:   Lactic Acid, Venous 2.85 (*)    All other components within normal limits  CULTURE, BLOOD (ROUTINE X 2)  CULTURE, BLOOD (ROUTINE X 2)  CBC WITH DIFFERENTIAL/PLATELET  URINALYSIS, ROUTINE W REFLEX MICROSCOPIC  PROCALCITONIN  STREP PNEUMONIAE URINARY ANTIGEN  LEGIONELLA PNEUMOPHILA SEROGP 1 UR AG  I-STAT TROPONIN, ED  I-STAT CG4 LACTIC  ACID, ED    EKG  EKG Interpretation  Date/Time:  Saturday July 17 2017 08:35:45 EST Ventricular Rate:  95 PR Interval:    QRS Duration: 88 QT Interval:  322 QTC Calculation: 405 R Axis:   0 Text Interpretation:  Sinus rhythm Posterior infarct, old Confirmed by Julianne Rice [phone]), editor Philomena Doheny [phone]) on 07/17/2017 9:08:40 AM       Radiology Dg Chest 2 View  Result Date: 07/17/2017 CLINICAL DATA:  Cough for 3 weeks. EXAM: CHEST  2 VIEW COMPARISON:  Study obtained earlier in the day; July 08, 2017 FINDINGS: There is no edema or consolidation. Heart size and pulmonary vascularity are normal. No adenopathy. There is a stent in the left circumflex coronary artery region. No evident bone lesions. IMPRESSION: No edema or consolidation. Electronically Signed   By: Lowella Grip III M.D.   On: 07/17/2017 14:56   Dg Chest 2 View  Result Date: 07/17/2017 CLINICAL DATA:  57 year old male with cough and flu-like symptoms. EXAM: CHEST  2 VIEW COMPARISON:  Chest radiograph dated 07/08/2017 FINDINGS: The lungs are clear. There is no pleural effusion or pneumothorax. The cardiac silhouette is within normal limits. Coronary stent appears similar prior radiograph. No acute osseous pathology. IMPRESSION: No active cardiopulmonary disease. Electronically Signed   By: Anner Crete M.D.   On: 07/17/2017 05:50    Procedures Procedures (including critical care time)  Medications Ordered in ED Medications  ondansetron (ZOFRAN) injection 4 mg (0 mg Intravenous Hold 07/17/17 0958)  ketorolac (TORADOL) 30 MG/ML injection 30 mg (not administered)  acetaminophen (TYLENOL) tablet 1,000 mg (1,000 mg Oral Given 07/17/17 0622)  sodium chloride 0.9 % bolus 1,000 mL (0 mLs Intravenous Stopped 07/17/17 0958)  sodium chloride 0.9 % bolus 1,000 mL (0 mLs Intravenous Stopped 07/17/17 1207)  oseltamivir (TAMIFLU) capsule 75 mg (75 mg Oral Given 07/17/17 1230)     Initial Impression / Assessment  and Plan / ED Course  I have reviewed the triage vital signs and the nursing notes.  Pertinent labs & imaging results that were available during my care of the patient were reviewed by me and considered in my medical decision making (see chart for details).     Patient influenza A positive and continued lactic acidosis despite fluid resuscitation.  Otherwise, labs unremarkable, except for elevated glucose.  Repeat chest x-ray ordered considering development of decreased breath sounds and development of rales after fluids.  Chest x-ray is unchanged.  Considering continued elevation of lactic acid and patient's comorbidities, will admit for observation. Tamiflu given in the ED.  I consulted family medicine team who will admit the patient for further evaluation.   Patient also evaluated by Dr. Regenia Skeeter who guided the patient's management and agrees with plan.  Final Clinical Impressions(s) / ED Diagnoses   Final diagnoses:  Influenza A  Lactic acidosis    ED Discharge Orders    None       Frederica Kuster, PA-C 07/17/17 1546    Sherwood Gambler, MD 07/18/17 [phone]

## 2017-07-18 DIAGNOSIS — R112 Nausea with vomiting, unspecified: Secondary | ICD-10-CM

## 2017-07-18 DIAGNOSIS — J101 Influenza due to other identified influenza virus with other respiratory manifestations: Secondary | ICD-10-CM

## 2017-07-18 DIAGNOSIS — E872 Acidosis: Secondary | ICD-10-CM | POA: Diagnosis not present

## 2017-07-18 DIAGNOSIS — R05 Cough: Secondary | ICD-10-CM | POA: Diagnosis not present

## 2017-07-18 LAB — CBC WITH DIFFERENTIAL/PLATELET
BASOS ABS: 0 10*3/uL (ref 0.0–0.1)
BASOS PCT: 1 %
EOS PCT: 4 %
Eosinophils Absolute: 0.1 10*3/uL (ref 0.0–0.7)
HCT: 39.7 % (ref 39.0–52.0)
Hemoglobin: 13.4 g/dL (ref 13.0–17.0)
LYMPHS PCT: 30 %
Lymphs Abs: 1.2 10*3/uL (ref 0.7–4.0)
MCH: 29.2 pg (ref 26.0–34.0)
MCHC: 33.8 g/dL (ref 30.0–36.0)
MCV: 86.5 fL (ref 78.0–100.0)
MONO ABS: 0.5 10*3/uL (ref 0.1–1.0)
MONOS PCT: 12 %
Neutro Abs: 2.2 10*3/uL (ref 1.7–7.7)
Neutrophils Relative %: 53 %
Platelets: 141 10*3/uL — ABNORMAL LOW (ref 150–400)
RBC: 4.59 MIL/uL (ref 4.22–5.81)
RDW: 12.4 % (ref 11.5–15.5)
WBC: 4 10*3/uL (ref 4.0–10.5)

## 2017-07-18 LAB — COMPREHENSIVE METABOLIC PANEL
ALBUMIN: 3.3 g/dL — AB (ref 3.5–5.0)
ALT: 30 U/L (ref 17–63)
AST: 30 U/L (ref 15–41)
Alkaline Phosphatase: 53 U/L (ref 38–126)
Anion gap: 10 (ref 5–15)
BILIRUBIN TOTAL: 0.6 mg/dL (ref 0.3–1.2)
BUN: 9 mg/dL (ref 6–20)
CALCIUM: 8.5 mg/dL — AB (ref 8.9–10.3)
CO2: 23 mmol/L (ref 22–32)
Chloride: 104 mmol/L (ref 101–111)
Creatinine, Ser: 0.82 mg/dL (ref 0.61–1.24)
GFR calc Af Amer: >60 mL/min (ref >60)
GFR calc non Af Amer: >60 mL/min (ref >60)
GLUCOSE: 216 mg/dL — AB (ref 65–99)
Potassium: 3.9 mmol/L (ref 3.5–5.1)
Sodium: 137 mmol/L (ref 135–145)
TOTAL PROTEIN: 5.5 g/dL — AB (ref 6.5–8.1)

## 2017-07-18 LAB — GLUCOSE, CAPILLARY
Glucose-Capillary: 163 mg/dL — ABNORMAL HIGH (ref 65–99)
Glucose-Capillary: 169 mg/dL — ABNORMAL HIGH (ref 65–99)

## 2017-07-18 LAB — HIV ANTIBODY (ROUTINE TESTING W REFLEX): HIV Screen 4th Generation wRfx: NONREACTIVE

## 2017-07-18 MED ORDER — OSELTAMIVIR PHOSPHATE 75 MG PO CAPS: 75.0000 mg | ORAL_CAPSULE | Freq: Two times a day (BID) | ORAL | 0 refills | Status: AC

## 2017-07-18 MED ORDER — CARVEDILOL 12.5 MG PO TABS: 12.5000 mg | ORAL_TABLET | Freq: Two times a day (BID) | ORAL | 0 refills | Status: DC

## 2017-07-18 NOTE — Discharge Summary (Signed)
[facility] Discharge Summary  Patient name: [name] Medical record number: [id number] Date of birth: 1960/08/04 Age: 57 y.o. Gender: male Date of Admission: 07/17/2017  Date of Discharge: 07/18/17 Admitting Physician: Blane Ohara McDiarmid, MD  Primary Care Provider: Scot Jun, FNP Consultants: none  Indication for Hospitalization: Dehydration  Discharge Diagnoses/Problem List:  Lactic acidosis 2/2 dehydration Influenza A  Dysuria T2DM Hyperlipidemia Depression HTN CAD  Disposition: Home  Discharge Condition: Stable, improved  Discharge Exam:  General: well appearing, in NAD  Cardiovascular: RRR, no murmurs Respiratory: CTAB, normal effort on room air Abdomen: soft, nontender, nondistended, + bowel sounds Extremities: warm and well perfused, no LE edema   Brief Hospital Course:  [name] is a 57 y.o. male presenting with 3 week history of cough and congestion. He tested positive for influenza A and did not have any concerning findings on CXR. On admission had elevated lactic acid to 3.12 which improved with IV fluid hydration. He was started on tamiflu and did well clinically.  He also presented with 1 week history of dysuria at the very tip of his penis. He has had no trauma to that area. No blood in his urine. No trouble with stream, able to go on command. He was treated with doxycycline on 07/08/17 for respiratory symptoms. Has not had sexual intercourse in 5 months making STD exposure unlikely. On exam penile meatus patent without lesions or scarring and no scrotal palpation. He was advised to follow up with his PCP for this as an outpatient.   Issues for Follow Up:  1. Tamiflu for total of 5 days, last dose on 07/22/17 2. Please evaluate patient's dysuria. Would recommend at least a GC/CT which was not able to be collected inpatient.   Significant Procedures: none  Significant Labs and Imaging:  Recent Labs  Lab  07/17/17 0445 07/18/17 0926  WBC 5.8 4.0  HGB 14.2 13.4  HCT 42.9 39.7  PLT 170 141*   Recent Labs  Lab 07/17/17 0445 07/18/17 0926  NA 135 137  K 4.3 3.9  CL 102 104  CO2 21* 23  GLUCOSE 174* 216*  BUN 10 9  CREATININE 0.84 0.82  CALCIUM 8.9 8.5*  ALKPHOS  --  53  AST  --  30  ALT  --  30  ALBUMIN  --  3.3*     Results/Tests Pending at Time of Discharge: none  Discharge Medications:  Allergies as of 07/18/2017      Reactions   Bee Venom Anaphylaxis, Hives   Shellfish Allergy Anaphylaxis, Hives   Statins Other (See Comments)   Myalgias. Tolerating livalo.   Testosterone Cypionate    Testerone Injection --Increased breast tissue       Medication List    TAKE these medications   ARIPiprazole 2 MG tablet Commonly known as:  ABILIFY Take 1 tablet (2 mg total) by mouth daily. For mood control   aspirin EC 81 MG tablet Take 1 tablet (81 mg total) by mouth daily.   benzonatate 100 MG capsule Commonly known as:  TESSALON Take 1 capsule (100 mg total) by mouth every 8 (eight) hours.   carvedilol 12.5 MG tablet Commonly known as:  COREG Take 1 tablet (12.5 mg total) by mouth 2 (two) times daily with a meal.   COENZYME Q10 PO Take 30 mg by mouth daily.   Fish Oil 600 MG Caps Take 1,200 mg by mouth daily.   gabapentin 100 MG capsule Commonly known  as:  NEURONTIN Take 1 capsule (100 mg total) by mouth 3 (three) times daily.   lidocaine 5 % Commonly known as:  LIDODERM Place 1 patch onto the skin daily. Remove & Discard patch within 12 hours or as directed by MD   lisinopril 10 MG tablet Commonly known as:  PRINIVIL,ZESTRIL Take 1 tablet (10 mg total) by mouth daily. For high blood pressure   metFORMIN 500 MG 24 hr tablet Commonly known as:  GLUCOPHAGE-XR Take 1 tablet (500 mg total) by mouth daily with breakfast. What changed:  Another medication with the same name was removed. Continue taking this medication, and follow the directions you see here.    mirtazapine 30 MG tablet Commonly known as:  REMERON Take 1 tablet (30 mg total) by mouth at bedtime. For mood control   nitroGLYCERIN 0.4 MG SL tablet Commonly known as:  NITROSTAT Place 1 tablet (0.4 mg total) under the tongue every 5 (five) minutes as needed. For chest pain   oseltamivir 75 MG capsule Commonly known as:  TAMIFLU Take 1 capsule (75 mg total) by mouth 2 (two) times daily for 4 days.   traMADol 50 MG tablet Commonly known as:  ULTRAM Take 50 mg by mouth every 6 (six) hours as needed for moderate pain.       Discharge Instructions: Please refer to Patient Instructions section of EMR for full details.  Patient was counseled important signs and symptoms that should prompt return to medical care, changes in medications, dietary instructions, activity restrictions, and follow up appointments.   Follow-Up Appointments: Follow-up Information    Scot Jun, FNP. Call.   Specialty:  Family Medicine Why:  Make an appointment for hospital follow up and to discuss your urinary issues. Contact information: [place] [address] [address], [place], DO 07/18/2017, 10:47 AM PGY-2, [place]

## 2017-07-18 NOTE — Progress Notes (Signed)
Pt ready for discharge.  Rx and instructions reviewed and copy given.  Bus pass given to pt for ride.  Pt understands all follow up appointments post discharge.

## 2017-07-18 NOTE — Discharge Instructions (Signed)
It has been a pleasure taking care of you! You were admitted due to dehydration from flu. We have treated you with fluids. With that your symptoms improved to the point we think it is safe to let you go home. You should continue taking tamiflu twice a day for a total of 5 days, last dose will be on 07/22/17.   Please make sure you follow up with your primary care doctor regarding your urinary concerns.

## 2017-07-19 LAB — LEGIONELLA PNEUMOPHILA SEROGP 1 UR AG: L. pneumophila Serogp 1 Ur Ag: NEGATIVE

## 2017-07-22 LAB — CULTURE, BLOOD (ROUTINE X 2)
Culture: NO GROWTH
Culture: NO GROWTH
Special Requests: ADEQUATE
Special Requests: ADEQUATE

## 2017-07-28 ENCOUNTER — Ambulatory Visit (INDEPENDENT_AMBULATORY_CARE_PROVIDER_SITE_OTHER): Payer: Medicaid Other | Admitting: Family Medicine

## 2017-07-28 ENCOUNTER — Encounter: Payer: Self-pay | Admitting: Family Medicine

## 2017-07-28 VITALS — BP 120/80 | HR 68 | Temp 97.4°F | Resp 16 | Ht 68.0 in | Wt 183.8 lb

## 2017-07-28 DIAGNOSIS — I2510 Atherosclerotic heart disease of native coronary artery without angina pectoris: Secondary | ICD-10-CM | POA: Diagnosis not present

## 2017-07-28 DIAGNOSIS — Z23 Encounter for immunization: Secondary | ICD-10-CM | POA: Diagnosis not present

## 2017-07-28 DIAGNOSIS — F322 Major depressive disorder, single episode, severe without psychotic features: Secondary | ICD-10-CM

## 2017-07-28 DIAGNOSIS — E1159 Type 2 diabetes mellitus with other circulatory complications: Secondary | ICD-10-CM | POA: Diagnosis not present

## 2017-07-28 DIAGNOSIS — E7849 Other hyperlipidemia: Secondary | ICD-10-CM | POA: Diagnosis not present

## 2017-07-28 DIAGNOSIS — I10 Essential (primary) hypertension: Secondary | ICD-10-CM

## 2017-07-28 DIAGNOSIS — R05 Cough: Secondary | ICD-10-CM

## 2017-07-28 DIAGNOSIS — R059 Cough, unspecified: Secondary | ICD-10-CM

## 2017-07-28 DIAGNOSIS — R7989 Other specified abnormal findings of blood chemistry: Secondary | ICD-10-CM | POA: Diagnosis not present

## 2017-07-28 LAB — POCT URINALYSIS DIP (DEVICE)
BILIRUBIN URINE: NEGATIVE
GLUCOSE, UA: NEGATIVE mg/dL
HGB URINE DIPSTICK: NEGATIVE
KETONES UR: NEGATIVE mg/dL
LEUKOCYTES UA: NEGATIVE
Nitrite: NEGATIVE
Protein, ur: NEGATIVE mg/dL
SPECIFIC GRAVITY, URINE: 1.02 (ref 1.005–1.030)
UROBILINOGEN UA: 0.2 mg/dL (ref 0.0–1.0)
pH: 6.5 (ref 5.0–8.0)

## 2017-07-28 MED ORDER — BENZONATATE 100 MG PO CAPS: 100.0000 mg | ORAL_CAPSULE | Freq: Three times a day (TID) | ORAL | 0 refills | Status: DC

## 2017-07-28 MED ORDER — NITROGLYCERIN 0.4 MG SL SUBL: 0.4000 mg | SUBLINGUAL_TABLET | SUBLINGUAL | 3 refills | Status: DC | PRN

## 2017-07-28 MED ORDER — LISINOPRIL 10 MG PO TABS: 10.0000 mg | ORAL_TABLET | Freq: Every day | ORAL | 1 refills | Status: DC

## 2017-07-28 MED ORDER — METFORMIN HCL ER 500 MG PO TB24: 500.0000 mg | ORAL_TABLET | Freq: Every day | ORAL | 1 refills | Status: DC

## 2017-07-28 MED ORDER — CARVEDILOL 12.5 MG PO TABS: 12.5000 mg | ORAL_TABLET | Freq: Two times a day (BID) | ORAL | 1 refills | Status: DC

## 2017-07-28 NOTE — Patient Instructions (Addendum)
Contact Hospice and Palliative Care -for grief counseling [place], [place], [place] [address] Phone: [phone]    Complicated Grieving Grief is a normal response to the death of someone close to you. Feelings of fear, anger, and guilt can affect almost everyone who loses a loved one. It is also common to have symptoms of depression while you are grieving. These include problems with sleep, loss of appetite, and lack of energy. They may last for weeks or months after a loss. Complicated grief is different from normal grief or depression. Normal grieving involves sadness and feelings of loss, but these feelings are not constant. Complicated grief is a constant and severe type of grief. It interferes with your ability to function normally. It may last for several months to a year or longer. Complicated grief may require treatment from a mental health care provider. What are the causes? It is not known why some people continue to struggle with grief and others do not. You may be at higher risk for complicated grief if:  The death of your loved one was sudden or unexpected.  The death of your loved one was due to a violent event.  Your loved one committed suicide.  Your loved one was a child or a young person.  You were very close to or dependent on the loved one.  You have a history of depression.  What are the signs or symptoms? Signs and symptoms of complicated grief may include:  Feeling disbelief or numbness.  Being unable to enjoy good memories of your loved one.  Needing to avoid anything that reminds you of your loved one.  Being unable to stop thinking about the death.  Feeling intense anger or guilt.  Feeling alone and hopeless.  Feeling that your life is meaningless and empty.  Losing the desire to live.  How is this diagnosed? Your health care provider may diagnose complicated grief if:  You have constant symptoms of grief for 6-12 months or longer.  Your  symptoms are interfering with your ability to live your life.  Your health care provider may want you to see a mental health care provider. Many symptoms of depression are similar to the symptoms of complicated grief. It is important to be evaluated for complicated grief along with other mental health conditions. How is this treated? Talk therapy with a mental health provider is the most common treatment for complicated grief. During therapy, you will learn healthy ways to cope with the loss of your loved one. In some cases, your mental health care provider may also recommend antidepressant medicines. Follow these instructions at home:  Take care of yourself. ? Eat regular meals and maintain a healthy diet. Eat plenty of fruits, vegetables, and whole grains. ? Try to get some exercise each day. ? Keep regular hours for sleep. Try to get at least 8 hours of sleep each night.  Do not use drugs or alcohol to ease your symptoms.  Take medicines only as directed by your health care provider.  Spend time with friends and loved ones.  Consider joining a grief (bereavement) support group to help you deal with your loss.  Keep all follow-up visits as directed by your health care provider. This is important. Contact a health care provider if:  Your symptoms keep you from functioning normally.  Your symptoms do not get better with treatment. Get help right away if:  You have serious thoughts of hurting yourself or someone else.  You have suicidal  feelings. This information is not intended to replace advice given to you by your health care provider. Make sure you discuss any questions you have with your health care provider. Document Released: 06/15/2005 Document Revised: 11/21/2015 Document Reviewed: 11/23/2013 Elsevier Interactive Patient Education  [employer].

## 2017-07-28 NOTE — Progress Notes (Signed)
Patient ID: [name], male    DOB: January 31, 1961, 57 y.o.   MRN: [id number]  PCP: Scot Jun, FNP  Chief Complaint  Patient presents with  . Follow-up    depression    Subjective:  HPI  [name] is a 57 y.o. male with Type 2 diabetes, CAD, hypertension, MDD, and complicated grief presents for hospital follow-up and chronic condition management.  [name] was recently admitted to inpatient services on 07/17/2017 at [facility] due to complicated influenza A virus with lactic acidosis.  He was treated with Tamiflu and received aggressive IV fluid hydration.  The remainder of his hospital course was uncomplicated and he was discharged after 1 day.  He continues only to complain of a persistent cough which is mostly nonproductive.  He reports improvement of cough with benzonatate however he is currently out of medication.  He denies any fever, wheezing, chest tightness, shortness of breath, or dizziness.  [facility] a refill on benzonatate today.  [name] was also recently hospitalized at behavioral health for major depressive episode with suicidal ideations on 06/22/2017.  Today he reports that his symptoms stem from multiple family deaths sequentially over the course of 6 months.  He reports that his wife passed away 2016-12-22 of breast cancer following that a 13-year-old nephew passed away after being struck by motor vehicle, and 2 months ago his mother passed away of brain cancer.  He reports being depressed to the point of not getting out of bed and a family member finally prompted him to go be evaluated at the hospital due to worsening symptoms of depression.  He reports being diagnosed while in college with major depression disorder.  He is previously received both medication management and counseling services.  Reports at some point symptoms remitted and he stopped taking his antidepressant medications.  Reports at least every 3 years he has one episode of some major depressive  disorder exacerbation and this is happen with or without medications.  Presently reports improved mood, no suicidal ideations, no homicidal ideations, denies feelings of hopelessness, and denies any complications from taking medications. He is currently not in counseling however is interested in information concerning grief counseling/support group.  Chronic conditions [name] suffers from CAD and denies any recent anginal symptoms, PND, dyspnea or use of nitroglycerin.  He is followed by Dr. [name] Bishop at [place] with his next follow-up scheduled for 09/08/2017.  He has maintained exceptional blood pressure on current medication regimen.  Adheres to a DASH diet and participates in regular routine physical activity.  He also suffer from type 2 diabetes which has remained very well controlled.  Current A1c is 6.1.  His diabetes is managed on metformin 500 mg once a day only.  He does not routinely check his blood sugar which is not required.  [name] was also diagnosed with low testosterone back in June and placed on testosterone replacement.  He reports completing regimen 2 months ago and request testosterone level rechecked today.  He denies any issues with libido however feels that he has been more fatigued over the course of the last few months than previously.  Social History   Socioeconomic History  . Marital status: Divorced    Spouse name: Not on file  . Number of children: 2  . Years of education: GED  . Highest education level: Not on file  Social Needs  . Financial resource strain: Not on file  . Food insecurity - worry: Not on file  .  Food insecurity - inability: Not on file  . Transportation needs - medical: Not on file  . Transportation needs - non-medical: Not on file  Occupational History  . Not on file  Tobacco Use  . Smoking status: Former Smoker    Packs/day: 1.00    Years: 10.00    Pack years: 10.00    Types: Cigarettes    Last attempt to quit: 04/07/2009    Years  since quitting: 8.3  . Smokeless tobacco: Never Used  Substance and Sexual Activity  . 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 Drug use: No    Comment: 12/15/11 "last cocaine was 2010"  . Sexual activity: Not Currently  Other Topics Concern  . Not on file  Social History Narrative  . Not on file    Family History  Problem Relation Age of Onset  . Leukemia Mother   . Prostate cancer Father   . Coronary artery disease Paternal Grandmother   . Cancer Paternal Grandfather   . Cancer Brother    Review of Systems  Constitutional: Positive for appetite change and fatigue.       Increased appetite   HENT: Negative.   Respiratory: Positive for cough.   Cardiovascular: Negative.   Gastrointestinal: Negative.   Genitourinary: Negative.   Musculoskeletal: Negative.   Skin: Negative.   Neurological: Negative.   Psychiatric/Behavioral: Negative.      Patient Active Problem List   Diagnosis Date Noted  . Influenza A 07/17/2017  . Lactic acidosis   . Cough   . Nausea and vomiting   . Severe episode of recurrent major depressive disorder, without psychotic features ([place])   . MDD (major depressive disorder), single episode, severe with psychotic features ([place]) 06/27/2017  . MDD (major depressive disorder), severe ([place]) 06/23/2017  . Essential hypertension 01/13/2016  . Coronary artery disease involving native coronary artery of native heart without angina pectoris 01/13/2016  . Unstable angina ([place])   . ED (erectile dysfunction) 02/15/2014  . Allergy to contrast media (used for diagnostic x-rays) 05/12/2011  . Hypertension 05/09/2011  . Hyperlipidemia 05/09/2011  . DM2 (diabetes mellitus, type 2) ([place]) 07/10/2010  . CAD S/P percutaneous coronary angioplasty 07/10/2010  . DYSPEPSIA&OTHER [facility] DISORDERS FUNCTION STOMACH 07/10/2010    Allergies  Allergen Reactions  . Bee Venom Anaphylaxis and Hives  . Shellfish Allergy Anaphylaxis and  Hives  . Statins Other (See Comments)    Myalgias. Tolerating livalo.   . Testosterone Cypionate     Testerone Injection --Increased breast tissue     Prior to Admission medications   Medication Sig Start Date End Date Taking? Authorizing Provider  ARIPiprazole (ABILIFY) 2 MG tablet Take 1 tablet (2 mg total) by mouth daily. For mood control 06/29/17  Yes Money, Lowry Ram, FNP  aspirin EC 81 MG tablet Take 1 tablet (81 mg total) by mouth daily. 01/18/17  Yes Hilty, Nadean Corwin, MD  carvedilol (COREG) 12.5 MG tablet Take 1 tablet (12.5 mg total) by mouth 2 (two) times daily with a meal. 07/18/17  Yes Orson Eva J, DO  COENZYME Q10 PO Take 30 mg by mouth daily.   Yes [provider]  gabapentin (NEURONTIN) 100 MG capsule Take 1 capsule (100 mg total) by mouth 3 (three) times daily. 06/28/17  Yes Money, Lowry Ram, FNP  lidocaine (LIDODERM) 5 % Place 1 patch onto the skin daily. Remove & Discard patch within 12 hours or as directed  by MD 06/28/17  Yes Money, Lowry Ram, FNP  lisinopril (PRINIVIL,ZESTRIL) 10 MG tablet Take 1 tablet (10 mg total) by mouth daily. For high blood pressure 06/29/17  Yes Money, Lowry Ram, FNP  metFORMIN (GLUCOPHAGE-XR) 500 MG 24 hr tablet Take 1 tablet (500 mg total) by mouth daily with breakfast. 01/12/17  Yes Scot Jun, FNP  mirtazapine (REMERON) 30 MG tablet Take 1 tablet (30 mg total) by mouth at bedtime. For mood control 06/28/17  Yes Money, Lowry Ram, FNP  nitroGLYCERIN (NITROSTAT) 0.4 MG SL tablet Place 1 tablet (0.4 mg total) under the tongue every 5 (five) minutes as needed. For chest pain 08/28/13  Yes Hilty, Nadean Corwin, MD  Omega-3 Fatty Acids (FISH OIL) 600 MG CAPS Take 1,200 mg by mouth daily.    Yes [provider]  traMADol (ULTRAM) 50 MG tablet Take 50 mg by mouth every 6 (six) hours as needed for moderate pain.    Yes [provider]  benzonatate (TESSALON) 100 MG capsule Take 1 capsule (100 mg total) by mouth every 8 (eight)  hours. Patient not taking: Reported on 07/28/2017 07/08/17   Domenic Moras, PA-C    Past Medical, Surgical Family and Social History reviewed and updated.    Objective:   Today's Vitals   07/28/17 0803  BP: 120/80  Pulse: 68  Resp: 16  Temp: (!) 97.4 F (36.3 C)  TempSrc: Oral  SpO2: 98%  Weight: 183 lb 12.8 oz (83.4 kg)  Height: 5\' 8"  (1.727 m)    Wt Readings from Last 3 Encounters:  07/28/17 183 lb 12.8 oz (83.4 kg)  07/17/17 178 lb (80.7 kg)  07/08/17 178 lb (80.7 kg)    Physical Exam Physical Exam: Constitutional: Patient appears well-developed and well-nourished. No distress. HENT: Normocephalic, atraumatic, External right and left ear normal. Oropharynx is clear and moist.  Eyes: Conjunctivae and EOM are normal. PERRLA, no scleral icterus. Neck: Normal ROM. Neck supple. No JVD. No tracheal deviation. No thyromegaly. CVS: RRR, S1/S2 +, no murmurs, no gallops, no carotid bruit.  Pulmonary: Effort and breath sounds normal, no stridor, rhonchi, wheezes, rales.  Abdominal: Soft. BS +, no distension, tenderness, rebound or guarding.  Musculoskeletal: Normal range of motion. No edema and no tenderness.  Lymphadenopathy: No lymphadenopathy noted, cervical, inguinal or axillary Neuro: Alert. Normal reflexes, muscle tone coordination. No cranial nerve deficit. Skin: Skin is warm and dry. No rash noted. Not diaphoretic. No erythema. No pallor. Psychiatric: Normal mood and affect. Behavior, judgment, thought content normal.  Assessment & Plan:  1. Coronary artery disease involving native coronary artery of native heart without angina pectoris, no recent episodes of angina, dizziness, or dyspnea. Continue follow-up and management by cardiology.  Continue carvedilol and aspirin   2. Low testosterone in male, dx 11/2016. Completed one course of testosterone replacement approximately 2 months prior and has noticed increased fatigue since completion of medication. Will repeat  testosterone level total/free and recheck PSA level.   3. Other hyperlipidemia, LDL previously elevated 132. Currently managing cholesterol with diet, exercise, and fish oil. Rechecking a fasting lipid panel today.  4. Essential hypertension, well controlled, stable. We have discussed target BP range and blood pressure goal. I have advised patient to check BP regularly and to call us back or report to clinic if the numbers are consistently higher than 140/90. We discussed the importance of compliance with medical therapy and DASH diet recommended, consequences of uncontrolled hypertension discussed. Continue current BP medications   5. Cough, residual  from recent diagnosis of influenza A. Continue benzonatate 100-200 mg 3 times daily as needed. Patient advised if cough is accompanies with chest tightness, fever ,chills, wheezing, or SOB follow-up here at the office.   6. MDD (major depressive disorder), severe, symptoms controlled and managed well at present with medication. Patient is not interested in mental health counseling at present, however would like information regarding grief counseling. Continue current medications. Follow-up here if office if depressive symptoms or mood changes occur. Encourage patient to continue medications and do not d/c due to improved mood.    7. Type 2 diabetes mellitus with other circulatory complication, without long-term current use of insulin ([place]), well controlled . A1C 6.1. Continue once daily Metformin 500 mg along with diet and exercise. Current Body mass index is 27.95 kg/m.   8. Need for immunization against influenza- Flu Vaccine QUAD 36+ mos IM  9. Need for pneumococcal vaccination- Pneumococcal polysaccharide vaccine 23-valent greater than or equal to 2yo subcutaneous/IM   Meds ordered this encounter  Medications  . metFORMIN (GLUCOPHAGE-XR) 500 MG 24 hr tablet    Sig: Take 1 tablet (500 mg total) by mouth daily with breakfast.    Dispense:   90 tablet    Refill:  1    Order Specific Question:   Supervising Provider    Answer:   Tresa Garter [id number]  . benzonatate (TESSALON) 100 MG capsule    Sig: Take 1-2 capsules (100-200 mg total) by mouth 3 (three) times daily.    Dispense:  90 capsule    Refill:  0    Order Specific Question:   Supervising Provider    Answer:   Tresa Garter [id number]  . nitroGLYCERIN (NITROSTAT) 0.4 MG SL tablet    Sig: Place 1 tablet (0.4 mg total) under the tongue every 5 (five) minutes as needed. For chest pain    Dispense:  25 tablet    Refill:  3    Order Specific Question:   Supervising Provider    Answer:   Tresa Garter [id number]  . lisinopril (PRINIVIL,ZESTRIL) 10 MG tablet    Sig: Take 1 tablet (10 mg total) by mouth daily. For high blood pressure    Dispense:  90 tablet    Refill:  1    Order Specific Question:   Supervising Provider    Answer:   Tresa Garter [id number]  . carvedilol (COREG) 12.5 MG tablet    Sig: Take 1 tablet (12.5 mg total) by mouth 2 (two) times daily with a meal.    Dispense:  180 tablet    Refill:  1    Order Specific Question:   Supervising Provider    Answer:   Tresa Garter [id number]    Orders Placed This Encounter  Procedures  . Flu Vaccine QUAD 36+ mos IM  . Pneumococcal polysaccharide vaccine 23-valent greater than or equal to 2yo subcutaneous/IM  . TestT+TestF+SHBG  . PSA  . Lipid panel  . POCT urinalysis dip (device)    RTC: 6 months for chronic condition management or sooner if needed.  Carroll Sage. Kenton Kingfisher, MSN, FNP-C [facility]  [address] [place], [place] [address] [phone]

## 2017-07-29 LAB — LIPID PANEL
CHOLESTEROL TOTAL: 147 mg/dL (ref 100–199)
Chol/HDL Ratio: 3.1 ratio (ref 0.0–5.0)
HDL: 48 mg/dL (ref >39)
LDL Calculated: 85 mg/dL (ref 0–99)
Triglycerides: 69 mg/dL (ref 0–149)
VLDL CHOLESTEROL CAL: 14 mg/dL (ref 5–40)

## 2017-07-29 LAB — PSA: PROSTATE SPECIFIC AG, SERUM: 1.1 ng/mL (ref 0.0–4.0)

## 2017-08-02 ENCOUNTER — Telehealth: Payer: Self-pay | Admitting: Family Medicine

## 2017-08-02 LAB — TESTT+TESTF+SHBG
SEX HORMONE BINDING: 35.9 nmol/L (ref 19.3–76.4)
TESTOSTERONE FREE: 9.3 pg/mL (ref 7.2–24.0)
Testosterone, total: 340 ng/dL (ref 264.0–916.0)

## 2017-08-02 NOTE — Telephone Encounter (Signed)
Contact patient to advise his testosterone, PSA, and lipid panel were normal.   Carroll Sage. Kenton Kingfisher, MSN, FNP-C [facility]  [address] [place], [place] [address] [phone]

## 2017-08-03 NOTE — Telephone Encounter (Signed)
Left a vm for patient to callback 

## 2017-08-04 NOTE — Telephone Encounter (Signed)
Left a vm for patient

## 2017-08-05 NOTE — Telephone Encounter (Signed)
Unable to contact patient so letter has been sent for patient to contact office.

## 2017-08-18 ENCOUNTER — Other Ambulatory Visit: Payer: Self-pay

## 2017-08-18 ENCOUNTER — Telehealth: Payer: Self-pay

## 2017-08-18 NOTE — Telephone Encounter (Signed)
Left a vm for patient to callback regarding Tamiflu and other refills that he is needing. VM was not very clear that the patient left.

## 2017-09-08 ENCOUNTER — Ambulatory Visit: Payer: Medicaid Other | Admitting: Internal Medicine

## 2017-09-23 NOTE — Telephone Encounter (Signed)
Note not needed 

## 2017-10-02 ENCOUNTER — Other Ambulatory Visit: Payer: Self-pay

## 2017-10-02 ENCOUNTER — Inpatient Hospital Stay (HOSPITAL_COMMUNITY)
Admission: RE | Admit: 2017-10-02 | Discharge: 2017-10-04 | DRG: 885 | Disposition: A | Discharge status: Home / Self Care | Payer: Medicaid Other | Attending: Psychiatry | Admitting: Psychiatry

## 2017-10-02 ENCOUNTER — Encounter (HOSPITAL_COMMUNITY): Payer: Self-pay | Admitting: Emergency Medicine

## 2017-10-02 DIAGNOSIS — Z888 Allergy status to other drugs, medicaments and biological substances status: Secondary | ICD-10-CM | POA: Diagnosis not present

## 2017-10-02 DIAGNOSIS — F419 Anxiety disorder, unspecified: Secondary | ICD-10-CM | POA: Diagnosis not present

## 2017-10-02 DIAGNOSIS — I252 Old myocardial infarction: Secondary | ICD-10-CM

## 2017-10-02 DIAGNOSIS — Z7982 Long term (current) use of aspirin: Secondary | ICD-10-CM | POA: Diagnosis not present

## 2017-10-02 DIAGNOSIS — Z915 Personal history of self-harm: Secondary | ICD-10-CM

## 2017-10-02 DIAGNOSIS — F322 Major depressive disorder, single episode, severe without psychotic features: Secondary | ICD-10-CM | POA: Diagnosis present

## 2017-10-02 DIAGNOSIS — Z91013 Allergy to seafood: Secondary | ICD-10-CM | POA: Diagnosis not present

## 2017-10-02 DIAGNOSIS — F4010 Social phobia, unspecified: Secondary | ICD-10-CM | POA: Diagnosis not present

## 2017-10-02 DIAGNOSIS — I10 Essential (primary) hypertension: Secondary | ICD-10-CM | POA: Diagnosis present

## 2017-10-02 DIAGNOSIS — Z91030 Bee allergy status: Secondary | ICD-10-CM | POA: Diagnosis not present

## 2017-10-02 DIAGNOSIS — E119 Type 2 diabetes mellitus without complications: Secondary | ICD-10-CM | POA: Diagnosis not present

## 2017-10-02 DIAGNOSIS — Z955 Presence of coronary angioplasty implant and graft: Secondary | ICD-10-CM

## 2017-10-02 DIAGNOSIS — Z79899 Other long term (current) drug therapy: Secondary | ICD-10-CM | POA: Diagnosis not present

## 2017-10-02 DIAGNOSIS — Z7984 Long term (current) use of oral hypoglycemic drugs: Secondary | ICD-10-CM

## 2017-10-02 DIAGNOSIS — F410 Panic disorder [episodic paroxysmal anxiety] without agoraphobia: Secondary | ICD-10-CM | POA: Diagnosis not present

## 2017-10-02 DIAGNOSIS — I2510 Atherosclerotic heart disease of native coronary artery without angina pectoris: Secondary | ICD-10-CM | POA: Diagnosis present

## 2017-10-02 DIAGNOSIS — Z9114 Patient's other noncompliance with medication regimen: Secondary | ICD-10-CM

## 2017-10-02 DIAGNOSIS — Z818 Family history of other mental and behavioral disorders: Secondary | ICD-10-CM | POA: Diagnosis not present

## 2017-10-02 DIAGNOSIS — E785 Hyperlipidemia, unspecified: Secondary | ICD-10-CM | POA: Diagnosis present

## 2017-10-02 DIAGNOSIS — G4700 Insomnia, unspecified: Secondary | ICD-10-CM | POA: Diagnosis present

## 2017-10-02 DIAGNOSIS — F329 Major depressive disorder, single episode, unspecified: Secondary | ICD-10-CM | POA: Diagnosis present

## 2017-10-02 DIAGNOSIS — F1020 Alcohol dependence, uncomplicated: Secondary | ICD-10-CM | POA: Diagnosis present

## 2017-10-02 DIAGNOSIS — Z811 Family history of alcohol abuse and dependence: Secondary | ICD-10-CM

## 2017-10-02 DIAGNOSIS — F332 Major depressive disorder, recurrent severe without psychotic features: Principal | ICD-10-CM | POA: Diagnosis present

## 2017-10-02 DIAGNOSIS — Z87891 Personal history of nicotine dependence: Secondary | ICD-10-CM

## 2017-10-02 DIAGNOSIS — R450 Nervousness: Secondary | ICD-10-CM | POA: Diagnosis not present

## 2017-10-02 DIAGNOSIS — R45851 Suicidal ideations: Secondary | ICD-10-CM | POA: Diagnosis present

## 2017-10-02 MED ORDER — GABAPENTIN 100 MG PO CAPS
100.0000 mg | ORAL_CAPSULE | Freq: Three times a day (TID) | ORAL | Status: DC
  Administered 2017-10-02 – 2017-10-04 (×7): 100 mg via ORAL
  Filled 2017-10-02 (×6): qty 1
  Filled 2017-10-02: qty 42
  Filled 2017-10-02: qty 1
  Filled 2017-10-02: qty 42
  Filled 2017-10-02 (×3): qty 1
  Filled 2017-10-02: qty 42
  Filled 2017-10-02 (×2): qty 1

## 2017-10-02 MED ORDER — ASPIRIN EC 81 MG PO TBEC
81.0000 mg | DELAYED_RELEASE_TABLET | Freq: Every day | ORAL | Status: DC
  Administered 2017-10-02 – 2017-10-04 (×3): 81 mg via ORAL
  Filled 2017-10-02 (×5): qty 1
  Filled 2017-10-02: qty 14

## 2017-10-02 MED ORDER — ACETAMINOPHEN 325 MG PO TABS: 650.0000 mg | ORAL_TABLET | Freq: Four times a day (QID) | ORAL | Status: DC | PRN

## 2017-10-02 MED ORDER — METFORMIN HCL ER 500 MG PO TB24
500.0000 mg | ORAL_TABLET | Freq: Every day | ORAL | Status: DC
  Administered 2017-10-03 – 2017-10-04 (×2): 500 mg via ORAL
  Filled 2017-10-02 (×2): qty 1
  Filled 2017-10-02: qty 14
  Filled 2017-10-02 (×2): qty 1

## 2017-10-02 MED ORDER — NITROGLYCERIN 0.4 MG SL SUBL: 0.4000 mg | SUBLINGUAL_TABLET | SUBLINGUAL | Status: DC | PRN

## 2017-10-02 MED ORDER — HYDROXYZINE HCL 25 MG PO TABS
25.0000 mg | ORAL_TABLET | Freq: Three times a day (TID) | ORAL | Status: DC | PRN
  Administered 2017-10-03: 25 mg via ORAL
  Filled 2017-10-02: qty 10
  Filled 2017-10-02: qty 1

## 2017-10-02 MED ORDER — CARVEDILOL 12.5 MG PO TABS
12.5000 mg | ORAL_TABLET | Freq: Two times a day (BID) | ORAL | Status: DC
  Administered 2017-10-02 – 2017-10-04 (×4): 12.5 mg via ORAL
  Filled 2017-10-02: qty 28
  Filled 2017-10-02 (×6): qty 1
  Filled 2017-10-02: qty 28
  Filled 2017-10-02 (×2): qty 1

## 2017-10-02 MED ORDER — ARIPIPRAZOLE 2 MG PO TABS
2.0000 mg | ORAL_TABLET | Freq: Every day | ORAL | Status: DC
  Administered 2017-10-02 – 2017-10-04 (×3): 2 mg via ORAL
  Filled 2017-10-02 (×5): qty 1
  Filled 2017-10-02: qty 14

## 2017-10-02 MED ORDER — LIDOCAINE 5 % EX PTCH
1.0000 | MEDICATED_PATCH | CUTANEOUS | Status: DC
Start: 2017-10-02 — End: 2017-10-04
  Administered 2017-10-02 – 2017-10-03 (×2): 1 via TRANSDERMAL
  Filled 2017-10-02 (×5): qty 1

## 2017-10-02 MED ORDER — OMEGA-3-ACID ETHYL ESTERS 1 G PO CAPS
1.0000 g | ORAL_CAPSULE | Freq: Every day | ORAL | Status: DC
  Filled 2017-10-02 (×2): qty 1

## 2017-10-02 MED ORDER — LISINOPRIL 10 MG PO TABS
10.0000 mg | ORAL_TABLET | Freq: Every day | ORAL | Status: DC
  Administered 2017-10-02 – 2017-10-04 (×3): 10 mg via ORAL
  Filled 2017-10-02 (×2): qty 1
  Filled 2017-10-02: qty 14
  Filled 2017-10-02: qty 2
  Filled 2017-10-02 (×2): qty 1

## 2017-10-02 MED ORDER — ALUM & MAG HYDROXIDE-SIMETH 200-200-20 MG/5ML PO SUSP: 30.0000 mL | ORAL | Status: DC | PRN

## 2017-10-02 MED ORDER — OMEGA-3-ACID ETHYL ESTERS 1 G PO CAPS
1.0000 g | ORAL_CAPSULE | Freq: Two times a day (BID) | ORAL | Status: DC
  Administered 2017-10-02 – 2017-10-04 (×4): 1 g via ORAL
  Filled 2017-10-02 (×4): qty 1
  Filled 2017-10-02 (×2): qty 28
  Filled 2017-10-02 (×3): qty 1

## 2017-10-02 MED ORDER — MAGNESIUM HYDROXIDE 400 MG/5ML PO SUSP
30.0000 mL | Freq: Every day | ORAL | Status: DC | PRN
  Administered 2017-10-03: 30 mL via ORAL
  Filled 2017-10-02: qty 30

## 2017-10-02 MED ORDER — TRAZODONE HCL 50 MG PO TABS
50.0000 mg | ORAL_TABLET | Freq: Every evening | ORAL | Status: DC | PRN
  Filled 2017-10-02: qty 14

## 2017-10-02 MED ORDER — GABAPENTIN 100 MG PO CAPS
200.0000 mg | ORAL_CAPSULE | Freq: Three times a day (TID) | ORAL | Status: DC
  Filled 2017-10-02 (×6): qty 2

## 2017-10-02 MED ORDER — COENZYME Q10 30 MG PO CAPS: 30.0000 mg | ORAL_CAPSULE | Freq: Every day | ORAL | Status: DC

## 2017-10-02 MED ORDER — FISH OIL 600 MG PO CAPS: 1200.0000 mg | ORAL_CAPSULE | Freq: Every day | ORAL | Status: DC

## 2017-10-02 MED ORDER — MIRTAZAPINE 15 MG PO TABS
15.0000 mg | ORAL_TABLET | Freq: Every day | ORAL | Status: DC
  Administered 2017-10-02 – 2017-10-03 (×2): 15 mg via ORAL
  Filled 2017-10-02 (×3): qty 1
  Filled 2017-10-02: qty 14
  Filled 2017-10-02: qty 1

## 2017-10-02 NOTE — H&P (Addendum)
Behavioral Health Medical Screening Exam  [name] is an 57 y.o. male. Stopped taking prescribed medications and gradually, which caused gradual worsening of depression and anxiety. Passive SI. Medications from previous admission worked well, patient is just not compliant and he admits that.   Total Time spent with patient: 20 minutes  Psychiatric Specialty Exam: Physical Exam  Nursing note and vitals reviewed. Constitutional: He is oriented to person, place, and time. He appears well-developed and well-nourished.  Cardiovascular: Normal rate.  Respiratory: Effort normal.  Musculoskeletal: Normal range of motion.  Neurological: He is alert and oriented to person, place, and time.  Skin: Skin is warm.    Review of Systems  Constitutional: Negative.   HENT: Negative.   Eyes: Negative.   Respiratory: Negative.   Cardiovascular: Negative.   Gastrointestinal: Negative.   Genitourinary: Negative.   Musculoskeletal: Negative.   Skin: Negative.   Neurological: Negative.   Endo/Heme/Allergies: Negative.   Psychiatric/Behavioral: Positive for depression and suicidal ideas. The patient is nervous/anxious.     Blood pressure (!) 133/91, pulse 73, temperature 98.6 F (37 C), temperature source Oral, resp. rate 18, height 5\' 8"  (1.727 m), weight 84.4 kg (186 lb).Body mass index is 28.28 kg/m.  General Appearance: Casual  Eye Contact:  Fair  Speech:  Clear and Coherent and Normal Rate  Volume:  Normal  Mood:  Anxious  Affect:  Flat  Thought Process:  Goal Directed and Descriptions of Associations: Intact  Orientation:  Full (Time, Place, and Person)  Thought Content:  WDL  Suicidal Thoughts:  Yes.  without intent/plan  Homicidal Thoughts:  No  Memory:  Immediate;   Good Recent;   Good Remote;   Good  Judgement:  Fair  Insight:  Good  Psychomotor Activity:  Normal  Concentration: Concentration: Good and Attention Span: Good  Recall:  Good  Fund of Knowledge:Good  Language:  Good  Akathisia:  No  Handed:  Right  AIMS (if indicated):     Assets:  Communication Skills Desire for Improvement Financial Resources/Insurance Housing Social Support  Sleep:       Musculoskeletal: Strength & Muscle Tone: within normal limits Gait & Station: normal Patient leans: N/A  Blood pressure (!) 133/91, pulse 73, temperature 98.6 F (37 C), temperature source Oral, resp. rate 18, height 5\' 8"  (1.727 m), weight 84.4 kg (186 lb).  Recommendations:  Based on my evaluation the patient does not appear to have an emergency medical condition.  Admit to impatient  Lewis Shock, FNP 10/02/2017, 10:41 AM   Agree with NP Assessment

## 2017-10-02 NOTE — Progress Notes (Signed)
Patient did not attend the evening speaker AA meeting. Pt was notified that group was beginning but remained in bed.   
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
## 2017-10-02 NOTE — Progress Notes (Signed)
Admission note:  Patient is a 57 year old male admitted voluntarily to [facility] for depression and suicidal ideation.  Stressors are patient's wife passed in May of last year and his 16 yo nephew was hit by a car and died.  His mother also passed in 2018.  Patient reports prior suicide attempts.  Patient also states that he has a handgun, stun gun and several knives at home.  He lives alone and is on disability for chronic back pain.  Patient has a hx of alcohol abuse, however, states he has not drank for the past 3 weeks.  Patient reports sleeping excessively, depression, suicidal thoughts (fleeting and passive) with no specific plan.  He reports a sister as his support symptoms.  He also attends AA meetings and has a sponsor.  His medical hx includes HTN, hyperlipidemia and diabetes type 2.  Patient has also reported some paranoia over the past few weeks.  Patient has a past admission in December 2018.  He reports following up with [facility], however, stopped his medications because he felt better.  Patient has had some periods of sobriety; he denies any illicit drug use.  His UDS is pending since he was a walk in at [facility].

## 2017-10-02 NOTE — [facility] Suicide Risk Assessment (Signed)
[facility] Admission Suicide Risk Assessment   Nursing information obtained from:   patient and chart  Demographic factors:   57 year old widowed male, lives alone, on disability Current Mental Status:   see below Loss Factors:   widowed, on disability, medication non compliance Historical Factors:   history of depression Risk Reduction Factors:   resilience   Total Time spent with patient:  45 minutes  Principal Problem: MDD, No Psychotic Features  Diagnosis:   Patient Active Problem List   Diagnosis Date Noted  . Influenza A [J10.1] 07/17/2017  . Lactic acidosis [E87.2]   . Cough [R05]   . Nausea and vomiting [R11.2]   . Severe episode of recurrent major depressive disorder, without psychotic features ([place]) [F33.2]   . MDD (major depressive disorder), single episode, severe with psychotic features ([place]) [F32.3] 06/27/2017  . MDD (major depressive disorder), severe ([place]) [F32.2] 06/23/2017  . Essential hypertension [I10] 01/13/2016  . Coronary artery disease involving native coronary artery of native heart without angina pectoris [I25.10] 01/13/2016  . Unstable angina ([place]) [I20.0]   . ED (erectile dysfunction) [N52.9] 02/15/2014  . Allergy to contrast media (used for diagnostic x-rays) [Z91.041] 05/12/2011  . Hypertension [I10] 05/09/2011  . Hyperlipidemia [E78.5] 05/09/2011  . DM2 (diabetes mellitus, type 2) ([place]) [E11.9] 07/10/2010  . CAD S/P percutaneous coronary angioplasty [I25.10, Z98.61] 07/10/2010  . DYSPEPSIA&OTHER SPEC DISORDERS FUNCTION STOMACH [K31.89, R10.13] 07/10/2010    Continued Clinical Symptoms:    The "Alcohol Use Disorders Identification Test", Guidelines for Use in Primary Care, Second Edition.  World Pharmacologist [facility]). Score between 0-7:  no or low risk or alcohol related problems. Score between 8-15:  moderate risk of alcohol related problems. Score between 16-19:  high risk of alcohol related problems. Score 20 or above:  warrants further diagnostic  evaluation for alcohol dependence and treatment.   CLINICAL FACTORS:  57 year old male , widowed - wife passed away from complications of breast cancer last year. Has two children, ages 16, 33. Lives alone. On disability.  Presents to hospital voluntarily.  He is known to our unit from prior admission in December  2018. At the time he was admitted for worsening depression and suicidal ideations. Was diagnosed with MDD, was discharged on Abilify, Remeron.   Reports worsening depression over the last few weeks, and also describes increasing anxiety , panic symptoms in public or social interactions. He states he has been isolating more , and describes neuro-vegetative symptoms of depression as below- reports hypersomnia, erratic appetite, low energy level, anhedonia. Also descries passive SI.  He reports history of alcohol dependence, states relapsed earlier this year, but has now been sober x 3 weeks. Denies drug abuse .  He states " I was doing really good, feeling a lot better so I stopped the medications, because I felt I did not need them anymore".  Psychiatric history is remarkable for history of depression, prior suicide attempts by driving off the road in the 90's and by overdosing in 2005.  History of Alcohol Use Disorder, reports he had been sober x 4.5 years up to last year. As noted, now sober x 3 months.  Medical History is remarkable for CAD/MI 8 years ago, DM, HTN. States he is allergic to Statin medications, but describes muscular pain related to these medications( side effect )   Family History - both parents deceased, mother died last year, has three brothers and two sisters.History of alcohol dependence and depression in extended family, grandmother and  an uncle committed suicide   Dx- MDD, no psychotic features, Alcohol Use Disorder in Early Full Remission  Plan-Inpatient admission. Restart Abilify 2 mgrs QDAY, Remeron 15 mgrs QHS , which he has been on in the past with good  response . Also restart Neurontin at 100 mgrs TID initially for neuropathic pain and anxiety.       Musculoskeletal: Strength & Muscle Tone: within normal limits Gait & Station: normal Patient leans: N/A  Psychiatric Specialty Exam: Physical Exam  ROS denies headache, no chest pain, no dyspnea, no vomiting , no rash   Blood pressure (!) 133/91, pulse 73, temperature 98.6 F (37 C), temperature source Oral, resp. rate 18, height 5\' 8"  (1.727 m), weight 84.4 kg (186 lb).Body mass index is 28.28 kg/m.  General Appearance: Well Groomed in hospital garb   Eye Contact:  Good  Speech:  Normal Rate  Volume:  Normal  Mood:  Depressed describes mood as 3/10  Affect:  vaguely constricted and anxious  Thought Process:  Goal Directed and Descriptions of Associations: Intact  Orientation:  Full (Time, Place, and Person)  Thought Content:  no hallucinations, no delusions, not internally preoccupied   Suicidal Thoughts:  No denies any suicidal or self injurious ideations, no homicidal or violent ideations   Homicidal Thoughts:  No  Memory:  recent and remote grossly intact   Judgement:  Fair  Insight:  Fair  Psychomotor Activity:  Normal  Concentration:  Concentration: Good and Attention Span: Good  Recall:  Good  Fund of Knowledge:  Good  Language:  Good  Akathisia:  Negative  Handed:  Right  AIMS (if indicated):     Assets:  Communication Skills Desire for Improvement Resilience  ADL's:  Intact  Cognition:  WNL  Sleep:         COGNITIVE FEATURES THAT CONTRIBUTE TO RISK:  Closed-mindedness and Loss of executive function    SUICIDE RISK:   Moderate:  Frequent suicidal ideation with limited intensity, and duration, some specificity in terms of plans, no associated intent, good self-control, limited dysphoria/symptomatology, some risk factors present, and identifiable protective factors, including available and accessible social support.  PLAN OF CARE: Patient will be admitted to  inpatient psychiatric unit for stabilization and safety. Will provide and encourage milieu participation. Provide medication management and maked adjustments as needed.  Will follow daily.    I certify that inpatient services furnished can reasonably be expected to improve the patient's condition.   Jenne Campus, MD 10/02/2017, 11:22 AM

## 2017-10-02 NOTE — [facility] Suicide Risk Assessment (Signed)
[place] INPATIENT:  Family/Significant Other Suicide Prevention Education  Suicide Prevention Education:  Patient Refusal for Family/Significant Other Suicide Prevention Education: The patient [name] has refused to provide written consent for family/significant other to be provided Family/Significant Other Suicide Prevention Education during admission and/or prior to discharge.  Physician notified.  SPE completed with pt, as pt refused to consent to family contact. SPI pamphlet provided to pt and pt was encouraged to share information with support network, ask questions, and talk about any concerns relating to SPE. Pt denies access to guns/firearms and verbalized understanding of information provided. Mobile Crisis information also provided to pt.   Khizar Fiorella N Smart LCSW 10/02/2017, 11:23 AM

## 2017-10-02 NOTE — Plan of Care (Signed)
  Problem: Safety: Goal: Periods of time without injury will increase Outcome: Progressing Note:  Pt has not harmed self or others tonight.  He denies SI/HI and verbally contracts for safety.   

## 2017-10-02 NOTE — H&P (Addendum)
Psychiatric Admission Assessment Adult  Patient Identification: [name] MRN:  [id number] Date of Evaluation:  10/02/2017 Chief Complaint:  MDD Principal Diagnosis: MDD (major depressive disorder), severe ([place]) Diagnosis:   Patient Active Problem List   Diagnosis Date Noted  . Influenza A [J10.1] 07/17/2017  . Lactic acidosis [E87.2]   . Cough [R05]   . Nausea and vomiting [R11.2]   . Severe episode of recurrent major depressive disorder, without psychotic features ([place]) [F33.2]   . MDD (major depressive disorder), single episode, severe with psychotic features ([place]) [F32.3] 06/27/2017  . MDD (major depressive disorder), severe ([place]) [F32.2] 06/23/2017  . Essential hypertension [I10] 01/13/2016  . Coronary artery disease involving native coronary artery of native heart without angina pectoris [I25.10] 01/13/2016  . Unstable angina ([place]) [I20.0]   . ED (erectile dysfunction) [N52.9] 02/15/2014  . Allergy to contrast media (used for diagnostic x-rays) [Z91.041] 05/12/2011  . Hypertension [I10] 05/09/2011  . Hyperlipidemia [E78.5] 05/09/2011  . DM2 (diabetes mellitus, type 2) ([place]) [E11.9] 07/10/2010  . CAD S/P percutaneous coronary angioplasty [I25.10, Z98.61] 07/10/2010  . DYSPEPSIA&OTHER [facility] DISORDERS FUNCTION STOMACH [K31.89, R10.13] 07/10/2010   History of Present Illness:  10/02/17 [name] Assessment: 57 y.o. male. Patient presents voluntarily to [facility] for assessment. Per chart review, pt was admitted to [address] at [place] in late Dec 2018 for suicidal ideations. Pt is pleasant during assessment. He is oriented to person, place, date and situation. Pt reports his wife died in December 02, 2022 of last year, his 104 yo nephew was hit by a car and died, and his mom died in 2017-06-03. He reports two prior suicide attempts - 1993 when drove car of bridge in [place] and 2005 when he took intentional overdose. Patient reports he began feeling better due to taking his psych meds as  recommended. Pt says he decided to stop taking the meds because he felt he no longer needed them. As a result, he says he depressive symptoms have all returned. He says [facility] appointment but never returned in Jan 2019.  Pt endorses suicidal ideation. He is unable to contract for safety. Pt report having a plan that he doesn't share with Probation officer. He reports depressive syptoms incluidng: hypersomnia (pt also reporting he stays in bed at all times except to eat and go to bathroom), decreased grooming, fatigue, weight gain, anhedonia, guilt, isolating behavior, and irritability. Pt says he no longer goes to the gym or plays any of his musical instruments (cello, bass guitar & keyboard). He reports he [place] in a nice neighborhood, yet he has been fearful lately of someone breaking into his house. Pt says he has a .38 handgun, a stun gun and several hunting knives. Pt reports severe anxiety which he had never experienced prior to the past few months. He says he doesn't check his mailbox during the day for fear of seeing people. He says he had to leave his cart of groceries in the check out line because he became so anxious due to the crowd. Pt denies homicidal thoughts or physical aggression. Pt denies having access to firearms. Pt denies having any legal problems at this time. He reports a history of alcohol abuse. He was drinking daily until three weeks ago. (See below for substance abuse details). Pt denies hallucinations. Pt does not appear to be responding to internal stimuli and exhibits no delusional thought. Pt's reality testing appears to be intact. Patient also reports experiencing severe anxiety and some  paranoia over the past few months.   On Evaluation: 57 year old male , widowed - wife passed away from complications of breast cancer last year. Has two children, ages 78, 29. Lives alone. On disability. Presents to hospital voluntarily. He is known to our unit from prior admission in  December  2018. At the time he was admitted for worsening depression and suicidal ideations. Was diagnosed with MDD, was discharged on Abilify, Remeron.  Reports worsening depression over the last few weeks, and also describes increasing anxiety , panic symptoms in public or social interactions. He states he has been isolating more , and describes neuro-vegetative symptoms of depression as below- reports hypersomnia, erratic appetite, low energy level, anhedonia. Also descries passive SI.  He reports history of alcohol dependence, states relapsed earlier this year, but has now been sober x 3 weeks. Denies drug abuse . He states " I was doing really good, feeling a lot better so I stopped the medications, because I felt I did not need them anymore".   Associated Signs/Symptoms: Depression Symptoms:  depressed mood, insomnia, fatigue, feelings of worthlessness/guilt, hopelessness, suicidal thoughts without plan, anxiety, loss of energy/fatigue, disturbed sleep, decreased appetite, (Hypo) Manic Symptoms:  Denies Anxiety Symptoms:  Excessive Worry, Panic Symptoms, Social Anxiety, Psychotic Symptoms:  Denies PTSD Symptoms: NA Total Time spent with patient: 45 minutes  Past Psychiatric History: history of depression, prior suicide attempts by driving off the road in the 90's and by overdosing in 2005.  History of Alcohol Use Disorder, reports he had been sober x 4.5 years up to last year. As noted, now sober x 3 months.  Is the patient at risk to self? Yes.    Has the patient been a risk to self in the past 6 months? Yes.    Has the patient been a risk to self within the distant past? Yes.    Is the patient a risk to others? No.  Has the patient been a risk to others in the past 6 months? No.  Has the patient been a risk to others within the distant past? No.   Prior Inpatient Therapy: Prior Inpatient Therapy: Yes Prior Therapy Dates: 1993 & Dec 2018 Prior Therapy Facilty/Provider(s):  facility in [place] Reason for Treatment: suicide attempt, MDD with SI Prior Outpatient Therapy: Prior Outpatient Therapy: No Does patient have an ACCT team?: No Does patient have Intensive In-House Services?  : No Does patient have [facility] services? : No(pt reports he went once but never returned) Does patient have P4CC services?: No  Alcohol Screening: 1. How often do you have a drink containing alcohol?: Never(has not drank any etoh in the last 3 weeks) 2. How many drinks containing alcohol do you have on a typical day when you are drinking?: 5 or 6 3. How often do you have six or more drinks on one occasion?: Daily or almost daily AUDIT-C Score: 6 4. How often during the last year have you found that you were not able to stop drinking once you had started?: Daily or almost daily 5. How often during the last year have you failed to do what was normally expected from you becasue of drinking?: Daily or almost daily 6. How often during the last year have you needed a first drink in the morning to get yourself going after a heavy drinking session?: Weekly 7. How often during the last year have you had a feeling of guilt of remorse after drinking?: Weekly 8. How often  during the last year have you been unable to remember what happened the night before because you had been drinking?: Weekly 9. Have you or someone else been injured as a result of your drinking?: Yes, but not in the last year 10. Has a relative or friend or a doctor or another health worker been concerned about your drinking or suggested you cut down?: Yes, during the last year Alcohol Use Disorder Identification Test Final Score (AUDIT): 29 Substance Abuse History in the last 12 months:  Yes.   Consequences of Substance Abuse: Medical Consequences:  reviewed Legal Consequences:  reviewed Family Consequences:  reviewed Previous Psychotropic Medications: Yes  Psychological Evaluations: Yes  Past Medical History:   Past Medical History:  Diagnosis Date  . Arthritis   . Chronic lower back pain   . Coronary artery disease    a. Multiple prior caths/PCI. Cath 2013 with possible spasm of RCA, 70% ISR of mid LCx with subsequent DES to mLCx and prox LCX. b. H/o microvascular angina. c. Recurrent angina 08/2014 - s/p PTCA/DES to prox Cx, PTCA/CBA to OM1.  c. LHC 06/10/15 with patent stents and some ISR in LCX and OM-1 that was not flow limiting --> Rx   . Dyslipidemia    a. Intolerant to many statins except tolerating Livalo.  Marland Kitchen GERD (gastroesophageal reflux disease)   . Hypertension   . Myocardial infarction ([place]) ~ 2010  . S/P angioplasty with stent, DES, to proximal and mid LCX 12/15/11 12/15/2011  . Type II diabetes mellitus ([place])     Past Surgical History:  Procedure Laterality Date  . CARDIAC CATHETERIZATION  06/15/2002   LAD with prox 40% stenosis, norma L main, Cfx with 25% lesion, RCA with long mid 25% stenosis (Dr. Vita Barley)  . CARDIAC CATHETERIZATION  04/01/2010   normal L main, LAD wit mild stenosis, L Cfx with 70% in-stent restenosis, RCA with 70% in-stent restenosis, LVEF >60% (Dr. K. Mali Hilty) - cutting ballon arthrectomy to RCA & Cfx (Dr. Rockne Menghini)  . CARDIAC CATHETERIZATION  08/25/2010   preserved global LV contractility; multivessel CAD, diffuse 90-95% in-stent restenosis in prox placed Cfx stent - cutting balloon arthrectomy in Cfx with multiple dilatations 90-95% to 0% stenosis (Dr. Corky Downs)  . CARDIAC CATHETERIZATION  01/26/2011   PCI & stenting of aggresive in-stent restenosis within previously stented AV groove Cfx with 3.0x3m Taxus DES (previous stents were Promus) (Dr. JAdora Fridge  . CARDIAC CATHETERIZATION  05/11/2011   preserved LV function, 40% mid LAD stenosis, 30-40% narrowing proximal to stented semgnet of prox Cfx, patent mid RCA stent with smooth 20% narrowing in distal RCA (Dr. TCorky Downs  . CARDIAC CATHETERIZATION  12/15/2011   PCI & stenting of proximal & mid Cfx with  DES - 3.0x118min proximal, 3.0x1537mn mid (Dr. J. Adora Fridge. CARDIAC CATHETERIZATION N/A 06/10/2015   Procedure: Left Heart Cath and Coronary Angiography;  Surgeon: DanJolaine ArtistD;  Location: [facility];  Service: Cardiovascular;  Laterality: N/A;  . cardiometabolic testing  08/18/87/2119good exercise effort, peak VO2 79% predicted with normal VO2 HR curves (mild deconditioning)  . EXCISIONAL HEMORRHOIDECTOMY  1984  . LEFT HEART CATHETERIZATION WITH CORONARY ANGIOGRAM N/A 05/11/2011   Procedure: LEFT HEART CATHETERIZATION WITH CORONARY ANGIOGRAM;  Surgeon: ThoTroy SineD;  [facility];  Service: Cardiovascular;  Laterality: N/A;  Possible percutaneous coronary intervention, possible IVUS  . LEFT HEART CATHETERIZATION WITH CORONARY ANGIOGRAM N/A 12/15/2011   Procedure:  LEFT HEART CATHETERIZATION WITH CORONARY ANGIOGRAM;  Surgeon: Lorretta Harp, MD;  Location: [facility] CATH LAB;  Service: Cardiovascular;  Laterality: N/A;  . LEFT HEART CATHETERIZATION WITH CORONARY ANGIOGRAM N/A 09/05/2014   Procedure: LEFT HEART CATHETERIZATION WITH CORONARY ANGIOGRAM;  Surgeon: Burnell Blanks, MD;  Location: [facility] CATH LAB;  Service: Cardiovascular;  Laterality: N/A;  . LIPOMA EXCISION     back of the head  . NM MYOCAR PERF WALL MOTION  02/2012   lexiscan myoview; mild perfusion defect in mid inferolateral & basal inferolateral region (infarct/scar); EF 52%, abnormal but ow risk scan  . PERCUTANEOUS CORONARY STENT INTERVENTION (PCI-S)  09/05/2014   Procedure: PERCUTANEOUS CORONARY STENT INTERVENTION (PCI-S);  Surgeon: Burnell Blanks, MD;  Location: [facility] CATH LAB;  Service: Cardiovascular;;   Family History:  Family History  Problem Relation Age of Onset  . Leukemia Mother   . Prostate cancer Father   . Coronary artery disease Paternal Grandmother   . Cancer Paternal Grandfather   . Cancer Brother    Family Psychiatric  History: both parents deceased, mother died last year,  has three brothers and two sisters.History of alcohol dependence and depression in extended family, grandmother and an uncle committed suicide   Tobacco Screening: Have you used any form of tobacco in the last 30 days? (Cigarettes, Smokeless Tobacco, Cigars, and/or Pipes): No Social History:  Social History   Substance and Sexual Activity  Alcohol Use Yes     Social History   Substance and Sexual Activity  Drug Use No   Comment: 12/15/11 "last cocaine was 2010"    Additional Social History: Marital status: Widowed    Pain Medications: pt denies abuse - see pta meds list Prescriptions: pt denies abuse - see pta meds list Over the Counter: pt denies abuse - see pta meds list History of alcohol / drug use?: Yes Longest period of sobriety (when/how long): four years and one month Negative Consequences of Use: Personal relationships Name of Substance 1: etoh 1 - Age of First Use: 22 1 - Amount (size/oz): pint of gin plus two 22 oz beers 1 - Frequency: daily  1 - Duration: until 3 weeks ago 1 - Last Use / Amount: 09/11/17                  Allergies:   Allergies  Allergen Reactions  . Bee Venom Anaphylaxis and Hives  . Shellfish Allergy Anaphylaxis and Hives  . Statins Other (See Comments)    Myalgias. Tolerating livalo.   . Testosterone Cypionate     Testerone Injection --Increased breast tissue    Lab Results: No results found for this or any previous visit (from the past 48 hour(s)).  Blood Alcohol level:  Lab Results  Component Value Date   ETH <10 06/21/2017   ETH <5 35/32/9924    Metabolic Disorder Labs:  Lab Results  Component Value Date   HGBA1C 6.1 (H) 06/25/2017   MPG 128.37 06/25/2017   MPG 131 06/09/2015   No results found for: PROLACTIN Lab Results  Component Value Date   CHOL 147 07/28/2017   TRIG 69 07/28/2017   HDL 48 07/28/2017   CHOLHDL 3.1 07/28/2017   VLDL 17 12/08/2016   LDLCALC 85 07/28/2017   LDLCALC 132 (H) 12/08/2016     Current Medications: Current Facility-Administered Medications  Medication Dose Route Frequency Provider Last Rate Last Dose  . acetaminophen (TYLENOL) tablet 650 mg  650 mg Oral Q6H PRN Money, Lowry Ram, FNP      .  alum & mag hydroxide-simeth (MAALOX/MYLANTA) 200-200-20 MG/5ML suspension 30 mL  30 mL Oral Q4H PRN Money, Lowry Ram, FNP      . ARIPiprazole (ABILIFY) tablet 2 mg  2 mg Oral Daily Money, Lowry Ram, FNP   2 mg at 10/02/17 1212  . aspirin EC tablet 81 mg  81 mg Oral Daily Money, Lowry Ram, FNP   81 mg at 10/02/17 1212  . carvedilol (COREG) tablet 12.5 mg  12.5 mg Oral BID WC Money, [name] B, FNP      . gabapentin (NEURONTIN) capsule 100 mg  100 mg Oral TID Cobos, Myer Peer, MD   100 mg at 10/02/17 1213  . hydrOXYzine (ATARAX/VISTARIL) tablet 25 mg  25 mg Oral TID PRN Money, Lowry Ram, FNP      . lisinopril (PRINIVIL,ZESTRIL) tablet 10 mg  10 mg Oral Daily Money, Lowry Ram, FNP   10 mg at 10/02/17 1212  . magnesium hydroxide (MILK OF MAGNESIA) suspension 30 mL  30 mL Oral Daily PRN Money, Lowry Ram, FNP      . [START ON 10/03/2017] metFORMIN (GLUCOPHAGE-XR) 24 hr tablet 500 mg  500 mg Oral Q breakfast Money, Darnelle Maffucci B, FNP      . mirtazapine (REMERON) tablet 15 mg  15 mg Oral QHS Money, Lowry Ram, FNP      . nitroGLYCERIN (NITROSTAT) SL tablet 0.4 mg  0.4 mg Sublingual Q5 min PRN Money, Lowry Ram, FNP      . omega-3 acid ethyl esters (LOVAZA) capsule 1 g  1 g Oral BID Cobos, Myer Peer, MD      . traZODone (DESYREL) tablet 50 mg  50 mg Oral QHS PRN Money, Lowry Ram, FNP       PTA Medications: Medications Prior to Admission  Medication Sig Dispense Refill Last Dose  . testosterone (ANDROGEL) 50 MG/5GM (1%) GEL Place 5 g onto the skin daily.     . ARIPiprazole (ABILIFY) 2 MG tablet Take 1 tablet (2 mg total) by mouth daily. For mood control 30 tablet 0 Taking  . aspirin EC 81 MG tablet Take 1 tablet (81 mg total) by mouth daily. 90 tablet 3 Taking  . benzonatate (TESSALON) 100 MG capsule  Take 1-2 capsules (100-200 mg total) by mouth 3 (three) times daily. 90 capsule 0   . carvedilol (COREG) 12.5 MG tablet Take 1 tablet (12.5 mg total) by mouth 2 (two) times daily with a meal. 180 tablet 1   . COENZYME Q10 PO Take 30 mg by mouth daily.   Taking  . gabapentin (NEURONTIN) 100 MG capsule Take 1 capsule (100 mg total) by mouth 3 (three) times daily. 90 capsule 0 Taking  . lidocaine (LIDODERM) 5 % Place 1 patch onto the skin daily. Remove & Discard patch within 12 hours or as directed by MD 15 patch 0 Taking  . lisinopril (PRINIVIL,ZESTRIL) 10 MG tablet Take 1 tablet (10 mg total) by mouth daily. For high blood pressure 90 tablet 1   . metFORMIN (GLUCOPHAGE-XR) 500 MG 24 hr tablet Take 1 tablet (500 mg total) by mouth daily with breakfast. 90 tablet 1   . mirtazapine (REMERON) 30 MG tablet Take 1 tablet (30 mg total) by mouth at bedtime. For mood control 30 tablet 0 Taking  . nitroGLYCERIN (NITROSTAT) 0.4 MG SL tablet Place 1 tablet (0.4 mg total) under the tongue every 5 (five) minutes as needed. For chest pain 25 tablet 3   . Omega-3 Fatty Acids (FISH OIL) 600 MG CAPS Take  1,200 mg by mouth daily.    Taking  . traMADol (ULTRAM) 50 MG tablet Take 50 mg by mouth every 6 (six) hours as needed for moderate pain.    Taking    Musculoskeletal: Strength & Muscle Tone: within normal limits Gait & Station: normal Patient leans: N/A  Psychiatric Specialty Exam: Physical Exam  Nursing note and vitals reviewed. Constitutional: He is oriented to person, place, and time. He appears well-developed and well-nourished.  Cardiovascular: Normal rate.  Respiratory: Effort normal.  Musculoskeletal: Normal range of motion.  Neurological: He is alert and oriented to person, place, and time.  Skin: Skin is warm.    Review of Systems  Constitutional: Negative.   HENT: Negative.   Eyes: Negative.   Respiratory: Negative.   Cardiovascular: Negative.   Gastrointestinal: Negative.   Genitourinary:  Negative.   Musculoskeletal: Negative.   Skin: Negative.   Neurological: Negative.   Endo/Heme/Allergies: Negative.   Psychiatric/Behavioral: Positive for depression and suicidal ideas. The patient is nervous/anxious.     Blood pressure (!) 133/91, pulse 73, temperature 98.6 F (37 C), temperature source Oral, resp. rate 18, height 5' 8"  (1.727 m), weight 84.4 kg (186 lb).Body mass index is 28.28 kg/m.  General Appearance: Casual  Eye Contact:  Good  Speech:  Clear and Coherent and Normal Rate  Volume:  Normal  Mood:  Depressed  Affect:  Flat  Thought Process:  Goal Directed and Descriptions of Associations: Intact  Orientation:  Full (Time, Place, and Person)  Thought Content:  WDL  Suicidal Thoughts:  No  Homicidal Thoughts:  No  Memory:  Immediate;   Good Recent;   Good Remote;   Good  Judgement:  Fair  Insight:  Fair  Psychomotor Activity:  Normal  Concentration:  Concentration: Good and Attention Span: Good  Recall:  Good  Fund of Knowledge:  Good  Language:  Fair  Akathisia:  No  Handed:  Right  AIMS (if indicated):     Assets:  Communication Skills Desire for Improvement Financial Resources/Insurance Housing Physical Health Social Support Transportation  ADL's:  Intact  Cognition:  WNL  Sleep:       Treatment Plan Summary: Daily contact with patient to assess and evaluate symptoms and progress in treatment, Medication management and Plan is to:  -See SRA and MAR for medication management -Encourage group therapy participation  Observation Level/Precautions:  15 minute checks  Laboratory:  Reviewed  Psychotherapy:  Group therapy  Medications:  See [facility]  Consultations:  As needed  Discharge Concerns:  Compliance  Estimated LOS: 3-5 Days  Other:  Admit to [place] for Primary Diagnosis: MDD (major depressive disorder), severe ([place]) Long Term Goal(s): Improvement in symptoms so as ready for discharge  Short Term Goals:  Ability to identify changes in lifestyle to reduce recurrence of condition will improve, Ability to verbalize feelings will improve and Ability to disclose and discuss suicidal ideas  Physician Treatment Plan for Secondary Diagnosis: Principal Problem:   MDD (major depressive disorder), severe ([name]) Active Problems:   DM2 (diabetes mellitus, type 2) ([place])  Long Term Goal(s): Improvement in symptoms so as ready for discharge  Short Term Goals: Ability to demonstrate self-control will improve, Ability to identify and develop effective coping behaviors will improve, Ability to maintain clinical measurements within normal limits will improve and Ability to identify triggers associated with substance abuse/mental health issues will improve  I certify that inpatient services furnished can reasonably be expected to improve the  patient's condition.    Lewis Shock, FNP 4/6/20192:11 PM   I have discussed case with NP and have met with patient  Agree with NP note and assessment  57 year old male , widowed - wife passed away from complications of breast cancer last year. Has two children, ages 75, 23. Lives alone. On disability.  Presents to hospital voluntarily.  He is known to our unit from prior admission in December  2018. At the time he was admitted for worsening depression and suicidal ideations. Was diagnosed with MDD, was discharged on Abilify, Remeron.   Reports worsening depression over the last few weeks, and also describes increasing anxiety , panic symptoms in public or social interactions. He states he has been isolating more , and describes neuro-vegetative symptoms of depression as below- reports hypersomnia, erratic appetite, low energy level, anhedonia. Also descries passive SI.  He reports history of alcohol dependence, states relapsed earlier this year, but has now been sober x 3 weeks. Denies drug abuse .  He states " I was doing really good, feeling a lot better so I  stopped the medications, because I felt I did not need them anymore".  Psychiatric history is remarkable for history of depression, prior suicide attempts by driving off the road in the 90's and by overdosing in 2005.  History of Alcohol Use Disorder, reports he had been sober x 4.5 years up to last year. As noted, now sober x 3 months.  Medical History is remarkable for CAD/MI 8 years ago, DM, HTN. States he is allergic to Statin medications, but describes muscular pain related to these medications( side effect )   Family History - both parents deceased, mother died last year, has three brothers and two sisters.History of alcohol dependence and depression in extended family, grandmother and an uncle committed suicide   Dx- MDD, no psychotic features, Alcohol Use Disorder in Early Full Remission  Plan-Inpatient admission. Restart Abilify 2 mgrs QDAY, Remeron 15 mgrs QHS , which he has been on in the past with good response . Also restart Neurontin at 100 mgrs TID initially for neuropathic pain and anxiety.

## 2017-10-02 NOTE — Progress Notes (Signed)
PHARMACIST - PHYSICIAN ORDER COMMUNICATION  CONCERNING: P&T Medication Policy on Herbal Medications  DESCRIPTION:  This patient's order for:  Co Enzyme Q10  has been noted.  This product(s) is classified as an "herbal" or natural product. Due to a lack of definitive safety studies or FDA approval, nonstandard manufacturing practices, plus the potential risk of unknown drug-drug interactions while on inpatient medications, the Pharmacy and Therapeutics Committee does not permit the use of "herbal" or natural products of this type within [place].   ACTION TAKEN: The pharmacy department is unable to verify this order at this time and your patient has been informed of this safety policy. Please reevaluate patient's clinical condition at discharge and address if the herbal or natural product(s) should be resumed at that time.   

## 2017-10-02 NOTE — Tx Team (Signed)
Initial Treatment Plan 10/02/2017 2:03 PM [name] FHL:[id number]    PATIENT STRESSORS: Financial difficulties Medication change or noncompliance Substance abuse   PATIENT STRENGTHS: Active sense of humor Average or above average intelligence Capable of independent living Motivation for treatment/growth   PATIENT IDENTIFIED PROBLEMS: Depression  History of alcohol abuse  Suicidal Ideation  History of alcohol abuse  Chronic back pain  Noncompliance with medications           DISCHARGE CRITERIA:  Motivation to continue treatment in a less acute level of care Need for constant or close observation no longer present Reduction of life-threatening or endangering symptoms to within safe limits Verbal commitment to aftercare and medication compliance  PRELIMINARY DISCHARGE PLAN: Attend 12-step recovery group Return to previous living arrangement  PATIENT/FAMILY INVOLVEMENT: This treatment plan has been presented to and reviewed with the patient, [name].  The patient and family have been given the opportunity to ask questions and make suggestions.  Zipporah Plants, RN 10/02/2017, 2:03 PM

## 2017-10-02 NOTE — Progress Notes (Addendum)
D: Pt was in bed in his room upon initial approach.  Pt presents with depressed affect and mood.  He reports his day has been "challenging, just getting in and feeling in an unease state, it's getting better though."  Pt denies SI/HI, denies hallucinations, reports chronic back pain of 4/10.  Pt has been visible in milieu briefly with few peer interactions.  He did not attend evening group.    A: Introduced self to pt.  Actively listened to pt and offered support and encouragement. Medications administered per order.  PRN medication offered for pain and pt declined.  He reports decreased pain after scheduled lidocaine patch applied.  Pt provided urine specimen which appeared diluted.  Q15 minute safety checks maintained.  R: Pt is safe on the unit.  Pt is compliant with medications.  Pt verbally contracts for safety.  Will continue to monitor and assess.

## 2017-10-02 NOTE — BH Assessment (Signed)
Assessment Note  [name] is a 57 y.o. male. Patient presents voluntarily to [facility] for assessment. Per chart review, pt was admitted to [address] at [facility] in late Dec 2018 for suicidal ideations. Pt is pleasant during assessment. He is oriented to person, place, date and situation. Pt reports his wife died in 12/04/2022 of last year, his 63 yo nephew was hit by a car and died, and his mom died in 2017-06-05. He reports two prior suicide attempts - 1993 when drove car of bridge in [place] and 2005 when he took intentional overdose. Patient reports he began feeling better due to taking his psych meds as recommended. Pt says he decided to stop taking the meds because he felt he no longer needed them. As a result, he says he depressive symptoms have all returned. He says [facility] but never returned in Jan 2019.  Pt endorses suicidal ideation. He is unable to contract for safety. Pt report having a plan that he doesn't share with Probation officer. He reports depressive syptoms incluidng: hypersomnia (pt also reporting he stays in bed at all times except to eat and go to bathroom), decreased grooming, fatigue, weight gain, anhedonia, guilt, isolating behavior, and irritability. Pt says he no longer goes to the gym or plays any of his musical instruments (cello, bass guitar & keyboard). He reports he [place] in a nice neighborhood, yet he has been fearful lately of someone breaking into his house. Pt says he has a .38 handgun, a stun gun and several hunting knives. Pt reports severe anxiety which he had never experienced prior to the past few months. He says he doesn't check his mailbox during the day for fear of seeing people. He says he had to leave his cart of groceries in the check out line because he became so anxious due to the crowd. Pt denies homicidal thoughts or physical aggression. Pt denies having access to firearms. Pt denies having any legal problems at this time. He reports a history  of alcohol abuse. He was drinking daily until three weeks ago. (See below for substance abuse details). Pt denies hallucinations. Pt does not appear to be responding to internal stimuli and exhibits no delusional thought. Pt's reality testing appears to be intact. Patient also reports experiencing severe anxiety and some paranoia over the past few months.   Diagnosis: Major Depressive Disorder, Recurrent, Severe without Psychotic Features Alcohol Use Disorder, Severe  Past Medical History:  Past Medical History:  Diagnosis Date  . Arthritis   . Chronic lower back pain   . Coronary artery disease    a. Multiple prior caths/PCI. Cath 2013 with possible spasm of RCA, 70% ISR of mid LCx with subsequent DES to mLCx and prox LCX. b. H/o microvascular angina. c. Recurrent angina 08/2014 - s/p PTCA/DES to prox Cx, PTCA/CBA to OM1.  c. LHC 06/10/15 with patent stents and some ISR in LCX and OM-1 that was not flow limiting --> Rx   . Dyslipidemia    a. Intolerant to many statins except tolerating Livalo.  Marland Kitchen GERD (gastroesophageal reflux disease)   . Hypertension   . Myocardial infarction ([place]) ~ 2010  . S/P angioplasty with stent, DES, to proximal and mid LCX 12/15/11 12/15/2011  . Type II diabetes mellitus ([place])     Past Surgical History:  Procedure Laterality Date  . CARDIAC CATHETERIZATION  06/15/2002   LAD with prox 40% stenosis, norma L main, Cfx with 25% lesion,  RCA with long mid 25% stenosis (Dr. Vita Barley)  . CARDIAC CATHETERIZATION  04/01/2010   normal L main, LAD wit mild stenosis, L Cfx with 70% in-stent restenosis, RCA with 70% in-stent restenosis, LVEF >60% (Dr. K. Mali Hilty) - cutting ballon arthrectomy to RCA & Cfx (Dr. Rockne Menghini)  . CARDIAC CATHETERIZATION  08/25/2010   preserved global LV contractility; multivessel CAD, diffuse 90-95% in-stent restenosis in prox placed Cfx stent - cutting balloon arthrectomy in Cfx with multiple dilatations 90-95% to 0% stenosis (Dr. Corky Downs)  .  CARDIAC CATHETERIZATION  01/26/2011   PCI & stenting of aggresive in-stent restenosis within previously stented AV groove Cfx with 3.0x34mm Taxus DES (previous stents were Promus) (Dr. Adora Fridge)  . CARDIAC CATHETERIZATION  05/11/2011   preserved LV function, 40% mid LAD stenosis, 30-40% narrowing proximal to stented semgnet of prox Cfx, patent mid RCA stent with smooth 20% narrowing in distal RCA (Dr. Corky Downs)  . CARDIAC CATHETERIZATION  12/15/2011   PCI & stenting of proximal & mid Cfx with DES - 3.0x69mm in proximal, 3.0x78mm in mid (Dr. Adora Fridge)  . CARDIAC CATHETERIZATION N/A 06/10/2015   Procedure: Left Heart Cath and Coronary Angiography;  Surgeon: Jolaine Artist, MD;  Location: [facility];  Service: Cardiovascular;  Laterality: N/A;  . cardiometabolic testing  4/62/7035   good exercise effort, peak VO2 79% predicted with normal VO2 HR curves (mild deconditioning)  . EXCISIONAL HEMORRHOIDECTOMY  1984  . LEFT HEART CATHETERIZATION WITH CORONARY ANGIOGRAM N/A 05/11/2011   Procedure: LEFT HEART CATHETERIZATION WITH CORONARY ANGIOGRAM;  Surgeon: Troy Sine, MD;  Location: [facility] CATH LAB;  Service: Cardiovascular;  Laterality: N/A;  Possible percutaneous coronary intervention, possible IVUS  . LEFT HEART CATHETERIZATION WITH CORONARY ANGIOGRAM N/A 12/15/2011   Procedure: LEFT HEART CATHETERIZATION WITH CORONARY ANGIOGRAM;  Surgeon: Lorretta Harp, MD;  Location: [facility] CATH LAB;  Service: Cardiovascular;  Laterality: N/A;  . LEFT HEART CATHETERIZATION WITH CORONARY ANGIOGRAM N/A 09/05/2014   Procedure: LEFT HEART CATHETERIZATION WITH CORONARY ANGIOGRAM;  Surgeon: Burnell Blanks, MD;  Location: [facility] CATH LAB;  Service: Cardiovascular;  Laterality: N/A;  . LIPOMA EXCISION     back of the head  . NM MYOCAR PERF WALL MOTION  02/2012   lexiscan myoview; mild perfusion defect in mid inferolateral & basal inferolateral region (infarct/scar); EF 52%, abnormal but ow risk scan  .  PERCUTANEOUS CORONARY STENT INTERVENTION (PCI-S)  09/05/2014   Procedure: PERCUTANEOUS CORONARY STENT INTERVENTION (PCI-S);  Surgeon: Burnell Blanks, MD;  Location: [facility] CATH LAB;  Service: Cardiovascular;;    Family History:  Family History  Problem Relation Age of Onset  . Leukemia Mother   . Prostate cancer Father   . Coronary artery disease Paternal Grandmother   . Cancer Paternal Grandfather   . Cancer Brother     Social History:  reports that he quit smoking about 8 years ago. His smoking use included cigarettes. He has a 10.00 pack-year smoking history. He has never used smokeless tobacco. He reports that he drinks alcohol. He reports that he does not use drugs.  Additional Social History:  Alcohol / Drug Use Pain Medications: pt denies abuse - see pta meds list Prescriptions: pt denies abuse - see pta meds list Over the Counter: pt denies abuse - see pta meds list History of alcohol / drug use?: Yes Longest period of sobriety (when/how long): four years and one month Negative Consequences of Use: Personal relationships Substance #1 Name of Substance  1: etoh 1 - Age of First Use: 22 1 - Amount (size/oz): pint of gin plus two 22 oz beers 1 - Frequency: daily  1 - Duration: until 3 weeks ago 1 - Last Use / Amount: 09/11/17  CIWA: CIWA-Ar BP: (!) 133/91 Pulse Rate: 73 COWS:    Allergies:  Allergies  Allergen Reactions  . Bee Venom Anaphylaxis and Hives  . Shellfish Allergy Anaphylaxis and Hives  . Statins Other (See Comments)    Myalgias. Tolerating livalo.   . Testosterone Cypionate     Testerone Injection --Increased breast tissue     Home Medications:  Medications Prior to Admission  Medication Sig Dispense Refill  . ARIPiprazole (ABILIFY) 2 MG tablet Take 1 tablet (2 mg total) by mouth daily. For mood control 30 tablet 0  . aspirin EC 81 MG tablet Take 1 tablet (81 mg total) by mouth daily. 90 tablet 3  . benzonatate (TESSALON) 100 MG capsule Take 1-2  capsules (100-200 mg total) by mouth 3 (three) times daily. 90 capsule 0  . carvedilol (COREG) 12.5 MG tablet Take 1 tablet (12.5 mg total) by mouth 2 (two) times daily with a meal. 180 tablet 1  . COENZYME Q10 PO Take 30 mg by mouth daily.    Marland Kitchen gabapentin (NEURONTIN) 100 MG capsule Take 1 capsule (100 mg total) by mouth 3 (three) times daily. 90 capsule 0  . lidocaine (LIDODERM) 5 % Place 1 patch onto the skin daily. Remove & Discard patch within 12 hours or as directed by MD 15 patch 0  . lisinopril (PRINIVIL,ZESTRIL) 10 MG tablet Take 1 tablet (10 mg total) by mouth daily. For high blood pressure 90 tablet 1  . metFORMIN (GLUCOPHAGE-XR) 500 MG 24 hr tablet Take 1 tablet (500 mg total) by mouth daily with breakfast. 90 tablet 1  . mirtazapine (REMERON) 30 MG tablet Take 1 tablet (30 mg total) by mouth at bedtime. For mood control 30 tablet 0  . nitroGLYCERIN (NITROSTAT) 0.4 MG SL tablet Place 1 tablet (0.4 mg total) under the tongue every 5 (five) minutes as needed. For chest pain 25 tablet 3  . Omega-3 Fatty Acids (FISH OIL) 600 MG CAPS Take 1,200 mg by mouth daily.     . traMADol (ULTRAM) 50 MG tablet Take 50 mg by mouth every 6 (six) hours as needed for moderate pain.       OB/GYN Status:  No LMP for male patient.  General Assessment Data Location of Assessment: [facility] Assessment Services TTS Assessment: In system Is this a Tele or Face-to-Face Assessment?: Tele Assessment Is this an Initial Assessment or a Re-assessment for this encounter?: Initial Assessment Marital status: Widowed Maiden name: Bodi Kirchman Is patient pregnant?: No Pregnancy Status: No Living Arrangements: Alone Can pt return to current living arrangement?: Yes Admission Status: Voluntary Is patient capable of signing voluntary admission?: Yes Referral Source: Self/Family/Friend Insurance type: medicaid  Medical Screening Exam ([place]) Medical Exam completed: Yes(travis money np)  Crisis Care  Plan Living Arrangements: Alone Name of Psychiatrist: none Name of Therapist: none  Education Status Is patient currently in school?: No(through soph year at [employer] in [place]) Is the patient employed, unemployed or receiving disability?: Receiving disability income  Risk to self with the past 6 months Suicidal Ideation: Yes-Currently Present Has patient been a risk to self within the past 6 months prior to admission? : Yes Suicidal Intent: No Has patient had any suicidal intent within the past 6 months prior to  admission? : Yes Is patient at risk for suicide?: Yes Suicidal Plan?: Yes-Currently Present Has patient had any suicidal plan within the past 6 months prior to admission? : No Specify Current Suicidal Plan: pt won't say what plan is Access to Means: Yes Specify Access to Suicidal Means: owns .38 handgun, drives, access to OTC pills What has been your use of drugs/alcohol within the last 12 months?: daily etoh use until 3 weeks ago Previous Attempts/Gestures: Yes How many times?: 2(1993 - drove off bridge in [place], 2005 overdose) Other Self Harm Risks: none Triggers for Past Attempts: Unpredictable Intentional Self Injurious Behavior: None Family Suicide History: Yes(pt's uncle (his mom's brother)) Recent stressful life event(s): Loss (Comment), Other (Comment)(increasing anxiety, 3 major losses in 2018) Persecutory voices/beliefs?: No Depression: Yes Depression Symptoms: Fatigue, Loss of interest in usual pleasures, Guilt, Isolating, Feeling angry/irritable, Despondent(hypersomnia) Substance abuse history and/or treatment for substance abuse?: Yes Suicide prevention information given to non-admitted patients: Not applicable  Risk to Others within the past 6 months Homicidal Ideation: No Does patient have any lifetime risk of violence toward others beyond the six months prior to admission? : No Thoughts of Harm to Others: No Current Homicidal Intent: No Current  Homicidal Plan: No Access to Homicidal Means: No Identified Victim: none History of harm to others?: No Assessment of Violence: None Noted Violent Behavior Description: pt denies history of violence Does patient have access to weapons?: Yes (Comment)(.38 handgun, hunting knives, stun gun) Criminal Charges Pending?: No Does patient have a court date: No Is patient on probation?: No  Psychosis Hallucinations: None noted Delusions: None noted  Mental Status Report Appearance/Hygiene: Unremarkable(in clothing appropriate for weather) Eye Contact: Good Motor Activity: Freedom of movement Speech: Logical/coherent Level of Consciousness: Alert Mood: Anxious, Depressed, Sad, Anhedonia Affect: Appropriate to circumstance, Depressed, Sad Anxiety Level: Severe Thought Processes: Relevant, Coherent Judgement: Impaired Orientation: Person, Place, Time, Situation Obsessive Compulsive Thoughts/Behaviors: None  Cognitive Functioning Concentration: Decreased Memory: Recent Intact, Remote Intact Is patient IDD: No Is patient DD?: No Insight: Fair Impulse Control: Fair Appetite: Good Have you had any weight changes? : Gain Amount of the weight change? (lbs): (unknown amout - eating only junk food for 2 months) Sleep: Increased Total Hours of Sleep: 15 Vegetative Symptoms: Staying in bed, Decreased grooming  ADLScreening [facility] Assessment Services) Patient's cognitive ability adequate to safely complete daily activities?: Yes Patient able to express need for assistance with ADLs?: Yes Independently performs ADLs?: Yes (appropriate for developmental age)  Prior Inpatient Therapy Prior Inpatient Therapy: Yes Prior Therapy Dates: 1993 & Dec 2018 Prior Therapy Facilty/Provider(s): facility in [place] Reason for Treatment: suicide attempt, MDD with SI  Prior Outpatient Therapy Prior Outpatient Therapy: No Does patient have an ACCT team?: No Does patient have Intensive  In-House Services?  : No Does patient have [facility] services? : No(pt reports he went once but never returned) Does patient have P4CC services?: No  ADL Screening (condition at time of admission) Patient's cognitive ability adequate to safely complete daily activities?: Yes Is the patient deaf or have difficulty hearing?: No Does the patient have difficulty seeing, even when wearing glasses/contacts?: No Does the patient have difficulty concentrating, remembering, or making decisions?: No Patient able to express need for assistance with ADLs?: Yes Does the patient have difficulty dressing or bathing?: No Independently performs ADLs?: Yes (appropriate for developmental age) Does the patient have difficulty walking or climbing stairs?: No Weakness of Legs: None Weakness of Arms/Hands: None  Home Assistive Devices/Equipment Home  Assistive Devices/Equipment: Eyeglasses    Abuse/Neglect Assessment (Assessment to be complete while patient is alone) Abuse/Neglect Assessment Can Be Completed: Yes Physical Abuse: Denies Verbal Abuse: Denies Sexual Abuse: Denies Exploitation of patient/patient's resources: Denies Self-Neglect: Denies     Regulatory affairs officer (For Healthcare) Does Patient Have a Medical Advance Directive?: No Would patient like information on creating a medical advance directive?: No - Patient declined    Additional Information 1:1 In Past 12 Months?: No CIRT Risk: No Elopement Risk: No Does patient have medical clearance?: No     Disposition:  Disposition Initial Assessment Completed for this Encounter: Yes Disposition of Patient: Admit(travis money np accepts pt to [address] [facility]) Type of inpatient treatment program: Adult  On Site Evaluation by:   Reviewed with Physician:    Leron Croak P 10/02/2017 10:58 AM

## 2017-10-03 DIAGNOSIS — Z87891 Personal history of nicotine dependence: Secondary | ICD-10-CM

## 2017-10-03 LAB — COMPREHENSIVE METABOLIC PANEL
ALK PHOS: 54 U/L (ref 38–126)
ALT: 42 U/L (ref 17–63)
ANION GAP: 5 (ref 5–15)
AST: 25 U/L (ref 15–41)
Albumin: 4.2 g/dL (ref 3.5–5.0)
BUN: 13 mg/dL (ref 6–20)
CALCIUM: 9.3 mg/dL (ref 8.9–10.3)
CO2: 27 mmol/L (ref 22–32)
Chloride: 107 mmol/L (ref 101–111)
Creatinine, Ser: 0.75 mg/dL (ref 0.61–1.24)
GFR calc non Af Amer: >60 mL/min (ref >60)
Glucose, Bld: 133 mg/dL — ABNORMAL HIGH (ref 65–99)
POTASSIUM: 4.4 mmol/L (ref 3.5–5.1)
SODIUM: 139 mmol/L (ref 135–145)
Total Bilirubin: 0.5 mg/dL (ref 0.3–1.2)
Total Protein: 6.7 g/dL (ref 6.5–8.1)

## 2017-10-03 LAB — RAPID URINE DRUG SCREEN, HOSP PERFORMED
AMPHETAMINES: NOT DETECTED
BENZODIAZEPINES: NOT DETECTED
Barbiturates: NOT DETECTED
COCAINE: NOT DETECTED
OPIATES: NOT DETECTED
TETRAHYDROCANNABINOL: NOT DETECTED

## 2017-10-03 LAB — LIPID PANEL
CHOLESTEROL: 144 mg/dL (ref 0–200)
HDL: 51 mg/dL (ref >40)
LDL Cholesterol: 62 mg/dL (ref 0–99)
TRIGLYCERIDES: 157 mg/dL — AB (ref <150)
Total CHOL/HDL Ratio: 2.8 RATIO
VLDL: 31 mg/dL (ref 0–40)

## 2017-10-03 LAB — CBC
HCT: 43.8 % (ref 39.0–52.0)
Hemoglobin: 14.4 g/dL (ref 13.0–17.0)
MCH: 28.9 pg (ref 26.0–34.0)
MCHC: 32.9 g/dL (ref 30.0–36.0)
MCV: 87.8 fL (ref 78.0–100.0)
PLATELETS: 213 10*3/uL (ref 150–400)
RBC: 4.99 MIL/uL (ref 4.22–5.81)
RDW: 13 % (ref 11.5–15.5)
WBC: 4.8 10*3/uL (ref 4.0–10.5)

## 2017-10-03 LAB — TSH: TSH: 1.036 u[IU]/mL (ref 0.350–4.500)

## 2017-10-03 LAB — HEMOGLOBIN A1C
Hgb A1c MFr Bld: 6 % — ABNORMAL HIGH (ref 4.8–5.6)
Mean Plasma Glucose: 125.5 mg/dL

## 2017-10-03 LAB — GLUCOSE, CAPILLARY: GLUCOSE-CAPILLARY: 160 mg/dL — AB (ref 65–99)

## 2017-10-03 NOTE — Progress Notes (Addendum)
[facility] MD Progress Note  10/03/2017 9:59 AM [name]  MRN:  [id number]   Subjective:  Patient reports that he slept much better, but feels groggy this morning. He says that getting good sleep makes him feel better too. He reports still feeling depressed and had some passive SI last night, but is negative for SI/HI/AVH for today and contracts for safety. He reports that he was also prescribed Livalo for cholesterol and would like to restart it.  Objective: Patient's chart and findings reviewed and discussed with treatment team. Patient presents in his room. He appears sleepy, but is pleasant and cooperative. He has been seen in the day room and has been interacting with peers and staff appropriately. Discussed the medication Livalo and it is a statin medication. Pharmacy reports that it is not formulary and Pravastatin is the replacement. Patient decides not to start the Pravastatin due to previous history of muscle aches from statins.   Principal Problem: MDD (major depressive disorder), severe ([place]) Diagnosis:   Patient Active Problem List   Diagnosis Date Noted  . Influenza A [J10.1] 07/17/2017  . Lactic acidosis [E87.2]   . Cough [R05]   . Nausea and vomiting [R11.2]   . Severe episode of recurrent major depressive disorder, without psychotic features ([place]) [F33.2]   . MDD (major depressive disorder), single episode, severe with psychotic features ([place]) [F32.3] 06/27/2017  . MDD (major depressive disorder), severe ([place]) [F32.2] 06/23/2017  . Essential hypertension [I10] 01/13/2016  . Coronary artery disease involving native coronary artery of native heart without angina pectoris [I25.10] 01/13/2016  . Unstable angina ([place]) [I20.0]   . ED (erectile dysfunction) [N52.9] 02/15/2014  . Allergy to contrast media (used for diagnostic x-rays) [Z91.041] 05/12/2011  . Hypertension [I10] 05/09/2011  . Hyperlipidemia [E78.5] 05/09/2011  . DM2 (diabetes mellitus, type 2) ([place]) [E11.9] 07/10/2010   . CAD S/P percutaneous coronary angioplasty [I25.10, Z98.61] 07/10/2010  . DYSPEPSIA&OTHER SPEC DISORDERS FUNCTION STOMACH [K31.89, R10.13] 07/10/2010   Total Time spent with patient: 15 minutes  Past Psychiatric History: See H&P  Past Medical History:  Past Medical History:  Diagnosis Date  . Arthritis   . Chronic lower back pain   . Coronary artery disease    a. Multiple prior caths/PCI. Cath 2013 with possible spasm of RCA, 70% ISR of mid LCx with subsequent DES to mLCx and prox LCX. b. H/o microvascular angina. c. Recurrent angina 08/2014 - s/p PTCA/DES to prox Cx, PTCA/CBA to OM1.  c. LHC 06/10/15 with patent stents and some ISR in LCX and OM-1 that was not flow limiting --> Rx   . Dyslipidemia    a. Intolerant to many statins except tolerating Livalo.  Marland Kitchen GERD (gastroesophageal reflux disease)   . Hypertension   . Myocardial infarction ([place]) ~ 2010  . S/P angioplasty with stent, DES, to proximal and mid LCX 12/15/11 12/15/2011  . Type II diabetes mellitus ([place])     Past Surgical History:  Procedure Laterality Date  . CARDIAC CATHETERIZATION  06/15/2002   LAD with prox 40% stenosis, norma L main, Cfx with 25% lesion, RCA with long mid 25% stenosis (Dr. Vita Barley)  . CARDIAC CATHETERIZATION  04/01/2010   normal L main, LAD wit mild stenosis, L Cfx with 70% in-stent restenosis, RCA with 70% in-stent restenosis, LVEF >60% (Dr. K. Mali Hilty) - cutting ballon arthrectomy to RCA & Cfx (Dr. Rockne Menghini)  . CARDIAC CATHETERIZATION  08/25/2010   preserved global LV contractility; multivessel CAD, diffuse 90-95% in-stent restenosis in  prox placed Cfx stent - cutting balloon arthrectomy in Cfx with multiple dilatations 90-95% to 0% stenosis (Dr. Corky Downs)  . CARDIAC CATHETERIZATION  01/26/2011   PCI & stenting of aggresive in-stent restenosis within previously stented AV groove Cfx with 3.0x54m Taxus DES (previous stents were Promus) (Dr. JAdora Fridge  . CARDIAC CATHETERIZATION  05/11/2011    preserved LV function, 40% mid LAD stenosis, 30-40% narrowing proximal to stented semgnet of prox Cfx, patent mid RCA stent with smooth 20% narrowing in distal RCA (Dr. TCorky Downs  . CARDIAC CATHETERIZATION  12/15/2011   PCI & stenting of proximal & mid Cfx with DES - 3.0x168min proximal, 3.0x1549mn mid (Dr. J. Adora Fridge. CARDIAC CATHETERIZATION N/A 06/10/2015   Procedure: Left Heart Cath and Coronary Angiography;  Surgeon: DanJolaine ArtistD;  Location: [facility];  Service: Cardiovascular;  Laterality: N/A;  . cardiometabolic testing  08/11/22/4010good exercise effort, peak VO2 79% predicted with normal VO2 HR curves (mild deconditioning)  . EXCISIONAL HEMORRHOIDECTOMY  1984  . LEFT HEART CATHETERIZATION WITH CORONARY ANGIOGRAM N/A 05/11/2011   Procedure: LEFT HEART CATHETERIZATION WITH CORONARY ANGIOGRAM;  Surgeon: ThoTroy SineD;  [facility];  Service: Cardiovascular;  Laterality: N/A;  Possible percutaneous coronary intervention, possible IVUS  . LEFT HEART CATHETERIZATION WITH CORONARY ANGIOGRAM N/A 12/15/2011   Procedure: LEFT HEART CATHETERIZATION WITH CORONARY ANGIOGRAM;  Surgeon: JonLorretta HarpD;  [facility];  Service: Cardiovascular;  Laterality: N/A;  . LEFT HEART CATHETERIZATION WITH CORONARY ANGIOGRAM N/A 09/05/2014   Procedure: LEFT HEART CATHETERIZATION WITH CORONARY ANGIOGRAM;  Surgeon: ChrBurnell BlanksD;  [facility];  Service: Cardiovascular;  Laterality: N/A;  . LIPOMA EXCISION     back of the head  . NM MYOCAR PERF WALL MOTION  02/2012   lexiscan myoview; mild perfusion defect in mid inferolateral & basal inferolateral region (infarct/scar); EF 52%, abnormal but ow risk scan  . PERCUTANEOUS CORONARY STENT INTERVENTION (PCI-S)  09/05/2014   Procedure: PERCUTANEOUS CORONARY STENT INTERVENTION (PCI-S);  Surgeon: ChrBurnell BlanksD;  [facility];  Service: Cardiovascular;;   Family History:  Family History   Problem Relation Age of Onset  . Leukemia Mother   . Prostate cancer Father   . Coronary artery disease Paternal Grandmother   . Cancer Paternal Grandfather   . Cancer Brother    Family Psychiatric  History: See H&P Social History:  Social History   Substance and Sexual Activity  Alcohol Use Yes     Social History   Substance and Sexual Activity  Drug Use No   Comment: 12/15/11 "last cocaine was 2010"    Social History   Socioeconomic History  . Marital status: Divorced    Spouse name: Not on file  . Number of children: 2  . Years of education: GED  . Highest education level: Not on file  Occupational History  . Not on file  Social Needs  . Financial resource strain: Not on file  . Food insecurity:    Worry: Not on file    Inability: Not on file  . Transportation needs:    Medical: Not on file    Non-medical: Not on file  Tobacco Use  . Smoking status: Former Smoker    Packs/day: 1.00    Years: 10.00    Pack years: 10.00    Types: Cigarettes    Last attempt to quit: 04/07/2009    Years since  quitting: 8.4  . Smokeless tobacco: Never Used  Substance and Sexual Activity  . Alcohol use: Yes  . Drug use: No    Comment: 12/15/11 "last cocaine was 2010"  . Sexual activity: Not Currently  Lifestyle  . Physical activity:    Days per week: Not on file    Minutes per session: Not on file  . Stress: Not on file  Relationships  . Social connections:    Talks on phone: Not on file    Gets together: Not on file    Attends religious service: Not on file    Active member of club or organization: Not on file    Attends meetings of clubs or organizations: Not on file    Relationship status: Not on file  Other Topics Concern  . Not on file  Social History Narrative  . Not on file   Additional Social History:    Pain Medications: pt denies abuse - see pta meds list Prescriptions: pt denies abuse - see pta meds list Over the Counter: pt denies abuse - see pta meds  list History of alcohol / drug use?: Yes Longest period of sobriety (when/how long): four years and one month Negative Consequences of Use: Personal relationships Name of Substance 1: etoh 1 - Age of First Use: 22 1 - Amount (size/oz): pint of gin plus two 22 oz beers 1 - Frequency: daily  1 - Duration: until 3 weeks ago 1 - Last Use / Amount: 09/11/17                  Sleep: Good  Appetite:  Good  Current Medications: Current Facility-Administered Medications  Medication Dose Route Frequency Provider Last Rate Last Dose  . acetaminophen (TYLENOL) tablet 650 mg  650 mg Oral Q6H PRN Money, Lowry Ram, FNP      . alum & mag hydroxide-simeth (MAALOX/MYLANTA) 200-200-20 MG/5ML suspension 30 mL  30 mL Oral Q4H PRN Money, Lowry Ram, FNP      . ARIPiprazole (ABILIFY) tablet 2 mg  2 mg Oral Daily Money, Lowry Ram, FNP   2 mg at 10/03/17 0754  . aspirin EC tablet 81 mg  81 mg Oral Daily Money, Lowry Ram, FNP   81 mg at 10/03/17 0754  . carvedilol (COREG) tablet 12.5 mg  12.5 mg Oral BID WC Money, Darnelle Maffucci B, FNP   12.5 mg at 10/03/17 0754  . gabapentin (NEURONTIN) capsule 100 mg  100 mg Oral TID Aerionna Moravek, Myer Peer, MD   100 mg at 10/03/17 0754  . hydrOXYzine (ATARAX/VISTARIL) tablet 25 mg  25 mg Oral TID PRN Money, Lowry Ram, FNP      . lidocaine (LIDODERM) 5 % 1 patch  1 patch Transdermal Q24H Evolette Pendell, Myer Peer, MD   1 patch at 10/02/17 2005  . lisinopril (PRINIVIL,ZESTRIL) tablet 10 mg  10 mg Oral Daily Money, Lowry Ram, FNP   10 mg at 10/03/17 0754  . magnesium hydroxide (MILK OF MAGNESIA) suspension 30 mL  30 mL Oral Daily PRN Money, Lowry Ram, FNP      . metFORMIN (GLUCOPHAGE-XR) 24 hr tablet 500 mg  500 mg Oral Q breakfast Money, Lowry Ram, FNP   500 mg at 10/03/17 0754  . mirtazapine (REMERON) tablet 15 mg  15 mg Oral QHS Money, Lowry Ram, FNP   15 mg at 10/02/17 2106  . nitroGLYCERIN (NITROSTAT) SL tablet 0.4 mg  0.4 mg Sublingual Q5 min PRN Money, Lowry Ram, FNP      .  omega-3 acid ethyl  esters (LOVAZA) capsule 1 g  1 g Oral BID Ina Poupard, Myer Peer, MD   1 g at 10/03/17 0754  . traZODone (DESYREL) tablet 50 mg  50 mg Oral QHS PRN Money, Lowry Ram, FNP        Lab Results:  Results for orders placed or performed during the hospital encounter of 10/02/17 (from the past 48 hour(s))  Urine rapid drug screen (hosp performed)not at [facility]     Status: None   Collection Time: 10/02/17 10:30 AM  Result Value Ref Range   Opiates NONE DETECTED NONE DETECTED   Cocaine NONE DETECTED NONE DETECTED   Benzodiazepines NONE DETECTED NONE DETECTED   Amphetamines NONE DETECTED NONE DETECTED   Tetrahydrocannabinol NONE DETECTED NONE DETECTED   Barbiturates NONE DETECTED NONE DETECTED    Comment: (NOTE) DRUG SCREEN FOR MEDICAL PURPOSES ONLY.  IF CONFIRMATION IS NEEDED FOR ANY PURPOSE, NOTIFY LAB WITHIN 5 DAYS. LOWEST DETECTABLE LIMITS FOR URINE DRUG SCREEN Drug Class                     Cutoff (ng/mL) Amphetamine and metabolites    1000 Barbiturate and metabolites    200 Benzodiazepine                 267 Tricyclics and metabolites     300 Opiates and metabolites        300 Cocaine and metabolites        300 THC                            50 Performed at [facility], [place] [address], [place], [place] [address]   CBC     Status: None   Collection Time: 10/03/17  6:14 AM  Result Value Ref Range   WBC 4.8 4.0 - 10.5 K/uL   RBC 4.99 4.22 - 5.81 MIL/uL   Hemoglobin 14.4 13.0 - 17.0 g/dL   HCT 43.8 39.0 - 52.0 %   MCV 87.8 78.0 - 100.0 fL   MCH 28.9 26.0 - 34.0 pg   MCHC 32.9 30.0 - 36.0 g/dL   RDW 13.0 11.5 - 15.5 %   Platelets 213 150 - 400 K/uL    Comment: Performed at [facility], [place] [address] [place], [place] [address]  Comprehensive metabolic panel     Status: Abnormal   Collection Time: 10/03/17  6:14 AM  Result Value Ref Range   Sodium 139 135 - 145 mmol/L   Potassium 4.4 3.5 - 5.1 mmol/L   Chloride 107 101 - 111 mmol/L   CO2 27 22  - 32 mmol/L   Glucose, Bld 133 (H) 65 - 99 mg/dL   BUN 13 6 - 20 mg/dL   Creatinine, Ser 0.75 0.61 - 1.24 mg/dL   Calcium 9.3 8.9 - 10.3 mg/dL   Total Protein 6.7 6.5 - 8.1 g/dL   Albumin 4.2 3.5 - 5.0 g/dL   AST 25 15 - 41 U/L   ALT 42 17 - 63 U/L   Alkaline Phosphatase 54 38 - 126 U/L   Total Bilirubin 0.5 0.3 - 1.2 mg/dL   GFR calc non Af Amer >60 >60 mL/min   GFR calc Af Amer >60 >60 mL/min    Comment: (NOTE) The eGFR has been calculated using the CKD EPI equation. This calculation has not been validated in all clinical situations. eGFR's persistently <60 mL/min signify possible  Chronic Kidney Disease.    Anion gap 5 5 - 15    Comment: Performed at [facility], [place] [address] [place], [place] [address]  TSH     Status: None   Collection Time: 10/03/17  6:14 AM  Result Value Ref Range   TSH 1.036 0.350 - 4.500 uIU/mL    Comment: Performed by a 3rd Generation assay with a functional sensitivity of <=0.01 uIU/mL. Performed at [facility], [place] [address] [place], [place] [address]   Lipid panel     Status: Abnormal   Collection Time: 10/03/17  6:14 AM  Result Value Ref Range   Cholesterol 144 0 - 200 mg/dL   Triglycerides 157 (H) <150 mg/dL   HDL 51 >40 mg/dL   Total CHOL/HDL Ratio 2.8 RATIO   VLDL 31 0 - 40 mg/dL   LDL Cholesterol 62 0 - 99 mg/dL    Comment:        Total Cholesterol/HDL:CHD Risk Coronary Heart Disease Risk Table                     Men   Women  1/2 Average Risk   3.4   3.3  Average Risk       5.0   4.4  2 X Average Risk   9.6   7.1  3 X Average Risk  23.4   11.0        Use the calculated Patient Ratio above and the CHD Risk Table to determine the patient's CHD Risk.        ATP III CLASSIFICATION (LDL):  <100     mg/dL   Optimal  100-129  mg/dL   Near or Above                    Optimal  130-159  mg/dL   Borderline  160-189  mg/dL   High  >190     mg/dL   Very High Performed at [facility] [address] [place], [place] [address]     Blood Alcohol level:  Lab Results  Component Value Date   [facility] <10 06/21/2017   ETH <5 03/55/9741    Metabolic Disorder Labs: Lab Results  Component Value Date   HGBA1C 6.1 (H) 06/25/2017   MPG 128.37 06/25/2017   MPG 131 06/09/2015   No results found for: PROLACTIN Lab Results  Component Value Date   CHOL 144 10/03/2017   TRIG 157 (H) 10/03/2017   HDL 51 10/03/2017   CHOLHDL 2.8 10/03/2017   VLDL 31 10/03/2017   LDLCALC 62 10/03/2017   LDLCALC 85 07/28/2017    Physical Findings: AIMS: Facial and Oral Movements Muscles of Facial Expression: None, normal Lips and Perioral Area: None, normal Jaw: None, normal Tongue: None, normal,Extremity Movements Upper (arms, wrists, hands, fingers): None, normal Lower (legs, knees, ankles, toes): None, normal, Trunk Movements Neck, shoulders, hips: None, normal, Overall Severity Severity of abnormal movements (highest score from questions above): None, normal Incapacitation due to abnormal movements: None, normal Patient's awareness of abnormal movements (rate only patient's report): No Awareness, Dental Status Current problems with teeth and/or dentures?: No Does patient usually wear dentures?: No  CIWA:    COWS:     Musculoskeletal: Strength & Muscle Tone: within normal limits Gait & Station: normal Patient leans: N/A  Psychiatric Specialty Exam: Physical Exam  Nursing note and vitals reviewed. Constitutional: He is oriented to person, place, and time. He  appears well-developed and well-nourished.  Cardiovascular: Normal rate.  Respiratory: Effort normal.  Musculoskeletal: Normal range of motion.  Neurological: He is alert and oriented to person, place, and time.  Skin: Skin is warm.    Review of Systems  Constitutional: Negative.   HENT: Negative.   Eyes: Negative.   Respiratory: Negative.   Cardiovascular: Negative.   Gastrointestinal: Negative.    Genitourinary: Negative.   Musculoskeletal: Negative.   Skin: Negative.   Neurological: Negative.   Endo/Heme/Allergies: Negative.   Psychiatric/Behavioral: Positive for depression. Negative for hallucinations and suicidal ideas. The patient is nervous/anxious.     Blood pressure (!) 127/101, pulse 74, temperature 98.2 F (36.8 C), temperature source Oral, resp. rate 16, height 5' 8"  (1.727 m), weight 84.4 kg (186 lb).Body mass index is 28.28 kg/m.  General Appearance: Casual  Eye Contact:  Good  Speech:  Clear and Coherent and Normal Rate  Volume:  Normal  Mood:  Depressed  Affect:  Flat  Thought Process:  Goal Directed and Descriptions of Associations: Intact  Orientation:  Full (Time, Place, and Person)  Thought Content:  WDL  Suicidal Thoughts:  No  Homicidal Thoughts:  No  Memory:  Immediate;   Good Recent;   Good Remote;   Good  Judgement:  Fair  Insight:  Fair  Psychomotor Activity:  Normal  Concentration:  Concentration: Good and Attention Span: Good  Recall:  Good  Fund of Knowledge:  Good  Language:  Good  Akathisia:  No  Handed:  Right  AIMS (if indicated):     Assets:  Communication Skills Desire for Improvement Financial Resources/Insurance Housing Physical Health Social Support Transportation  ADL's:  Intact  Cognition:  WNL  Sleep:  Number of Hours: 6.5   Problems Addressed: MDD severe  Treatment Plan Summary: Daily contact with patient to assess and evaluate symptoms and progress in treatment, Medication management and Plan is to:  -Continue Remeron 15 mg PO QHS for mood stability and sleep -Continue Abilify 2 mg PO Daily for mood stability -Continue Trazodone 50 mg Q6H PRN for insomnia -Continue Neurontin 100 mg PO TID for withdrawal symptoms -Encourage group therapy participation  Lewis Shock, FNP 10/03/2017, 9:59 AM   Agree with NP Progress Note

## 2017-10-03 NOTE — BHH Group Notes (Signed)
[place] Group Notes:  (Nursing/MHT/Case Management/Adjunct)   Type of Therapy:  Psychoeducational Skills  Participation Level:  Did Not Attend  Participation Quality:  DID NOT ATTEND  Affect:  DID NOT ATTEND  Cognitive:  DID NOT ATTEND  Insight:  None  Engagement in Group:  DID NOT ATTEND  Modes of Intervention:  DID NOT ATTEND  Summary of Progress/Problems: Pt did not attend patient self inventory group.

## 2017-10-03 NOTE — Progress Notes (Signed)
D: Pt was in bed in his room upon initial approach.  Pt presents with depressed, anxious affect and mood.  He continues to report feeling "uneasy."  He discussed how he has "a problem with anxiety" and reports it was worse when he was in the cafeteria earlier.  Pt denies HI, denies hallucinations, denies pain.  He endorses passive SI without plan or intent.  He verbally contracts for safety.  He has isolated to his room for the majority of the evening and he did not attend evening group.  A: Introduced self to pt.  Actively listened to pt and offered support and encouragement. Medications administered per order.  PRN medication administered for anxiety.   Q15 minute safety checks maintained.  R: Pt is safe on the unit.  Pt is compliant with medications.  Pt verbally contracts for safety.  Will continue to monitor and assess.

## 2017-10-03 NOTE — BHH Group Notes (Signed)
[name] LCSW Group Therapy Note  Date/Time: 10/03/17, 0900  Type of Therapy/Topic:  Group Therapy:  Feelings about Diagnosis  Participation Level:  Did Not Attend   Mood:   Description of Group:    This group will allow patients to explore their thoughts and feelings about diagnoses they have received. Patients will be guided to explore their level of understanding and acceptance of these diagnoses. Facilitator will encourage patients to process their thoughts and feelings about the reactions of others to their diagnosis, and will guide patients in identifying ways to discuss their diagnosis with significant others in their lives. This group will be process-oriented, with patients participating in exploration of their own experiences as well as giving and receiving support and challenge from other group members.   Therapeutic Goals: 1. Patient will demonstrate understanding of diagnosis as evidence by identifying two or more symptoms of the disorder:  2. Patient will be able to express two feelings regarding the diagnosis 3. Patient will demonstrate ability to communicate their needs through discussion and/or role plays  Summary of Patient Progress:        Therapeutic Modalities:   Cognitive Behavioral Therapy Brief Therapy Feelings Identification   Lurline Idol, LCSW

## 2017-10-03 NOTE — Plan of Care (Signed)
Patient verbalizes understanding of information, education provided. 

## 2017-10-03 NOTE — Progress Notes (Signed)
D: Patient observed resting in bed, isolative, withdrawn. Patient states he is "okay" and provides minimal information. Calm and cooperative. Patient's affect flat, mood depressed. Per self inventory and discussions with writer, rates depression at a 9/10, hopelessness at a 7/10 and anxiety at an 8/10. Rates sleep as good, appetite as poor, energy as low and concentration as poor.  States goal for today is "I don't know." Denied pain to this Probation officer however indicates on his self inventory that his back pain is a 6/10. No other physical complaints.   A: Medicated per orders, no prns requested or required. Patient states lidocaine patch is helping. Level III obs in place for safety. Emotional support offered and self inventory reviewed. Encouraged completion of Suicide Safety Plan and programming participation. Discussed POC with MD, SW.   R: Patient verbalizes understanding of POC. Patient denies HI/AVH however endorses passive SI. Verbal contract in place and patient remains safe on level III obs. Will continue to monitor closely and make verbal contact frequently.

## 2017-10-03 NOTE — Progress Notes (Signed)
Patient came to staff complaining of feelings of low blood sugar. Money, NP made aware, order received to check x 1. CBG "160." Patient encouraged to push fluids, fluids provided, and reminded of fall precautions. Patient verbalized understanding. Remains safe at present.

## 2017-10-04 MED ORDER — ARIPIPRAZOLE 2 MG PO TABS: 2.0000 mg | ORAL_TABLET | Freq: Every day | ORAL | 0 refills | Status: DC

## 2017-10-04 MED ORDER — TRAZODONE HCL 50 MG PO TABS: 50.0000 mg | ORAL_TABLET | Freq: Every evening | ORAL | 0 refills | Status: DC | PRN

## 2017-10-04 MED ORDER — ASPIRIN EC 81 MG PO TBEC: 81.0000 mg | DELAYED_RELEASE_TABLET | Freq: Every day | ORAL | 0 refills | Status: DC

## 2017-10-04 MED ORDER — MIRTAZAPINE 15 MG PO TABS: 15.0000 mg | ORAL_TABLET | Freq: Every day | ORAL | 0 refills | Status: DC

## 2017-10-04 MED ORDER — CARVEDILOL 12.5 MG PO TABS: 12.5000 mg | ORAL_TABLET | Freq: Two times a day (BID) | ORAL | 0 refills | Status: DC

## 2017-10-04 MED ORDER — METFORMIN HCL ER 500 MG PO TB24: 500.0000 mg | ORAL_TABLET | Freq: Every day | ORAL | 0 refills | Status: DC

## 2017-10-04 MED ORDER — GABAPENTIN 100 MG PO CAPS: 100.0000 mg | ORAL_CAPSULE | Freq: Three times a day (TID) | ORAL | 0 refills | Status: DC

## 2017-10-04 MED ORDER — OMEGA-3-ACID ETHYL ESTERS 1 G PO CAPS: 1.0000 g | ORAL_CAPSULE | Freq: Two times a day (BID) | ORAL | 0 refills | Status: DC

## 2017-10-04 MED ORDER — HYDROXYZINE HCL 25 MG PO TABS: 25.0000 mg | ORAL_TABLET | Freq: Three times a day (TID) | ORAL | 0 refills | Status: DC | PRN

## 2017-10-04 MED ORDER — LISINOPRIL 10 MG PO TABS: 10.0000 mg | ORAL_TABLET | Freq: Every day | ORAL | 0 refills | Status: DC

## 2017-10-04 NOTE — Progress Notes (Signed)
Inpatient Diabetes Program Recommendations  AACE/ADA: New Consensus Statement on Inpatient Glycemic Control (2015)  Target Ranges:  Prepandial:   less than 140 mg/dL      Peak postprandial:   less than 180 mg/dL (1-2 hours)      Critically ill patients:  140 - 180 mg/dL   Lab Results  Component Value Date   GLUCAP 160 (H) 10/03/2017   HGBA1C 6.0 (H) 10/03/2017    Review of Glycemic Control  Diabetes history: DM2 Outpatient Diabetes medications: metformin 500 QD with breakfast Current orders for Inpatient glycemic control: same as above  HgbA1C - 6.0% Indicates diagnosis of pre-diabetes.  Inpatient Diabetes Program Recommendations:     While inpatient and on OHA, Add Novolog 0-9 units tidwc.  Will follow.  Thank you. Lorenda Peck, RD, LDN, CDE Inpatient Diabetes Coordinator [phone]

## 2017-10-04 NOTE — Tx Team (Signed)
Interdisciplinary Treatment and Diagnostic Plan Update  10/04/2017 Time of Session: [name] MRN: [id number]  Principal Diagnosis: MDD (major depressive disorder), severe ([place])  Secondary Diagnoses: Principal Problem:   MDD (major depressive disorder), severe ([place]) Active Problems:   DM2 (diabetes mellitus, type 2) ([place])   Current Medications:  Current Facility-Administered Medications  Medication Dose Route Frequency Provider Last Rate Last Dose  . acetaminophen (TYLENOL) tablet 650 mg  650 mg Oral Q6H PRN [name], Kenneth Ram, [name]      . alum & mag hydroxide-simeth (MAALOX/MYLANTA) 200-200-20 MG/5ML suspension 30 mL  30 mL Oral Q4H PRN [name], Kenneth Ram, [name]      . ARIPiprazole (ABILIFY) tablet 2 mg  2 mg Oral Daily [name], Kenneth Ram, [name]   2 mg at 10/04/17 0754  . aspirin EC tablet 81 mg  81 mg Oral Daily [name], Kenneth Ram, [name]   81 mg at 10/04/17 0753  . carvedilol (COREG) tablet 12.5 mg  12.5 mg Oral BID WC [name], Kenneth Maffucci [name], [name]   12.5 mg at 10/04/17 0753  . gabapentin (NEURONTIN) capsule 100 mg  100 mg Oral TID [name], Kenneth Peer, [name]   100 mg at 10/04/17 0753  . hydrOXYzine (ATARAX/VISTARIL) tablet 25 mg  25 mg Oral TID PRN [name], Kenneth Ram, [name]   25 mg at 10/03/17 1951  . lidocaine (LIDODERM) 5 % 1 patch  1 patch Transdermal Q24H [name], Kenneth Peer, [name]   1 patch at 10/03/17 1950  . lisinopril (PRINIVIL,ZESTRIL) tablet 10 mg  10 mg Oral Daily [name], Kenneth Ram, [name]   10 mg at 10/04/17 0754  . magnesium hydroxide (MILK OF MAGNESIA) suspension 30 mL  30 mL Oral Daily PRN [name], Kenneth Maffucci [name], [name]   30 mL at 10/03/17 1715  . metFORMIN (GLUCOPHAGE-XR) 24 hr tablet 500 mg  500 mg Oral Q breakfast [name], Kenneth Ram, [name]   500 mg at 10/04/17 0754  . mirtazapine (REMERON) tablet 15 mg  15 mg Oral QHS [name], Kenneth Ram, [name]   15 mg at 10/03/17 2103  . nitroGLYCERIN (NITROSTAT) SL tablet 0.4 mg  0.4 mg Sublingual Q5 min PRN [name], Kenneth Ram, [name]      . omega-3 acid ethyl esters (LOVAZA) capsule 1 g  1 g  Oral BID [name], Kenneth Peer, [name]   1 g at 10/04/17 0753  . traZODone (DESYREL) tablet 50 mg  50 mg Oral QHS PRN [name], Kenneth Ram, [name]       PTA Medications: Medications Prior to Admission  Medication Sig Dispense Refill Last Dose  . testosterone (ANDROGEL) 50 MG/5GM (1%) GEL Place 5 g onto the skin daily.     . ARIPiprazole (ABILIFY) 2 MG tablet Take 1 tablet (2 mg total) by mouth daily. For mood control 30 tablet 0 Taking  . aspirin EC 81 MG tablet Take 1 tablet (81 mg total) by mouth daily. 90 tablet 3 Taking  . benzonatate (TESSALON) 100 MG capsule Take 1-2 capsules (100-200 mg total) by mouth 3 (three) times daily. 90 capsule 0   . carvedilol (COREG) 12.5 MG tablet Take 1 tablet (12.5 mg total) by mouth 2 (two) times daily with a meal. 180 tablet 1   . COENZYME Q10 PO Take 30 mg by mouth daily.   Taking  . gabapentin (NEURONTIN) 100 MG capsule Take 1 capsule (100 mg total) by mouth 3 (three) times daily. 90 capsule 0 Taking  . lidocaine (LIDODERM) 5 % Place 1 patch onto the skin daily.  Remove & Discard patch within 12 hours or as directed by [name] 15 patch 0 Taking  . lisinopril (PRINIVIL,ZESTRIL) 10 MG tablet Take 1 tablet (10 mg total) by mouth daily. For high blood pressure 90 tablet 1   . metFORMIN (GLUCOPHAGE-XR) 500 MG 24 hr tablet Take 1 tablet (500 mg total) by mouth daily with breakfast. 90 tablet 1   . mirtazapine (REMERON) 30 MG tablet Take 1 tablet (30 mg total) by mouth at bedtime. For mood control 30 tablet 0 Taking  . nitroGLYCERIN (NITROSTAT) 0.4 MG SL tablet Place 1 tablet (0.4 mg total) under the tongue every 5 (five) minutes as needed. For chest pain 25 tablet 3   . Omega-3 Fatty Acids (FISH OIL) 600 MG CAPS Take 1,200 mg by mouth daily.    Taking  . traMADol (ULTRAM) 50 MG tablet Take 50 mg by mouth every 6 (six) hours as needed for moderate pain.    Taking    Patient Stressors: Financial difficulties Medication change or noncompliance Substance abuse  Patient Strengths:  Active sense of humor Average or above average intelligence Capable of independent living Motivation for treatment/growth  Treatment Modalities: Medication Management, Group therapy, Case management,  1 to 1 session with clinician, Psychoeducation, Recreational therapy.   Physician Treatment Plan for Primary Diagnosis: MDD (major depressive disorder), severe ([place]) Long Term Goal(s): Improvement in symptoms so as ready for discharge Improvement in symptoms so as ready for discharge   Short Term Goals: Ability to identify changes in lifestyle to reduce recurrence of condition will improve Ability to verbalize feelings will improve Ability to disclose and discuss suicidal ideas Ability to demonstrate self-control will improve Ability to identify and develop effective coping behaviors will improve Ability to maintain clinical measurements within normal limits will improve Ability to identify triggers associated with substance abuse/mental health issues will improve  Medication Management: Evaluate patient's response, side effects, and tolerance of medication regimen.  Therapeutic Interventions: 1 to 1 sessions, Unit Group sessions and Medication administration.  Evaluation of Outcomes: Progressing  Physician Treatment Plan for Secondary Diagnosis: Principal Problem:   MDD (major depressive disorder), severe ([place]) Active Problems:   DM2 (diabetes mellitus, type 2) ([place])  Long Term Goal(s): Improvement in symptoms so as ready for discharge Improvement in symptoms so as ready for discharge   Short Term Goals: Ability to identify changes in lifestyle to reduce recurrence of condition will improve Ability to verbalize feelings will improve Ability to disclose and discuss suicidal ideas Ability to demonstrate self-control will improve Ability to identify and develop effective coping behaviors will improve Ability to maintain clinical measurements within normal limits will  improve Ability to identify triggers associated with substance abuse/mental health issues will improve     Medication Management: Evaluate patient's response, side effects, and tolerance of medication regimen.  Therapeutic Interventions: 1 to 1 sessions, Unit Group sessions and Medication administration.  Evaluation of Outcomes: Progressing   RN Treatment Plan for Primary Diagnosis: MDD (major depressive disorder), severe ([place]) Long Term Goal(s): Knowledge of disease and therapeutic regimen to maintain health will improve  Short Term Goals: Ability to remain free from injury will improve, Ability to disclose and discuss suicidal ideas and Ability to identify and develop effective coping behaviors will improve  Medication Management: RN will administer medications as ordered by provider, will assess and evaluate patient's response and provide education to patient for prescribed medication. RN will report any adverse and/or side effects to prescribing provider.  Therapeutic Interventions: 1 on 1 counseling  sessions, Psychoeducation, Medication administration, Evaluate responses to treatment, Monitor vital signs and CBGs as ordered, Perform/monitor CIWA, COWS, AIMS and Fall Risk screenings as ordered, Perform wound care treatments as ordered.  Evaluation of Outcomes: Progressing   LCSW Treatment Plan for Primary Diagnosis: MDD (major depressive disorder), severe ([place]) Long Term Goal(s): Safe transition to appropriate next level of care at discharge, Engage patient in therapeutic group addressing interpersonal concerns.  Short Term Goals: Engage patient in aftercare planning with referrals and resources, Facilitate patient progression through stages of change regarding substance use diagnoses and concerns and Identify triggers associated with mental health/substance abuse issues  Therapeutic Interventions: Assess for all discharge needs, 1 to 1 time with Social worker, Explore available  resources and support systems, Assess for adequacy in community support network, Educate family and significant other(s) on suicide prevention, Complete Psychosocial Assessment, Interpersonal group therapy.  Evaluation of Outcomes: Progressing   Progress in Treatment: Attending groups: Yes. Participating in groups: Yes. Taking medication as prescribed: Yes. Toleration medication: Yes. Family/Significant other contact made: SPE completed with pt; pt declined to consent to collateral contact.  Patient understands diagnosis: Yes. Discussing patient identified problems/goals with staff: Yes. Medical problems stabilized or resolved: Yes. Denies suicidal/homicidal ideation: Yes. Issues/concerns per patient self-inventory: No. Other: n/a   New problem(s) identified: No, Describe:  n/a  New Short Term/Long Term Goal(s): medication management for mood stabilization; elimination of SI thoughts; development of comprehensive mental wellness/sobriety plan.   Patient Goal: "To learn how to stay sober and get into residential treatment."   Discharge Plan or Barriers: [facility], [facility]/17 at 8:00AM. Taxi voucher provided.  [facility] for outpatient services. [place] pamphlet and AA information provided.   Reason for Continuation of Hospitalization: Anxiety Depression Medication stabilization Suicidal ideation  Estimated Length of Stay: Thursday, 10/07/17  Attendees: Patient: [name]  10/04/2017 9:01 AM  Physician: Dr. Parke Poisson [name]; Dr. Nancy Fetter [name] 10/04/2017 9:01 AM  Nursing: Opal Sidles RN; Doris RN 10/04/2017 9:01 AM  RN Care Manager:x 10/04/2017 9:01 AM  Social Worker: Press photographer, LCSW 10/04/2017 9:01 AM  Recreational Therapist: x 10/04/2017 9:01 AM  Other: Lindell Spar NP; Kenneth Maffucci Money NP 10/04/2017 9:01 AM  Other:  10/04/2017 9:01 AM  Other: 10/04/2017 9:01 AM    Scribe for Treatment Team: [place], LCSW 10/04/2017 9:01 AM

## 2017-10-04 NOTE — [facility] Group Notes (Signed)

## 2017-10-04 NOTE — Discharge Summary (Addendum)
Physician Discharge Summary Note  Patient:  [name] is an 57 y.o., male MRN:  [id number] DOB:  [birth year] Patient phone:  [phone] (home)  Patient address:   [place] [address],  Total Time spent with patient: Greater than 30 minutes  Date of Admission:  10/02/2017 Date of Discharge: 10/04/17  Reason for Admission:  Worsening depression, anxiety & agitation.  Principal Problem: MDD (major depressive disorder), severe [facility])  Discharge Diagnoses: Patient Active Problem List   Diagnosis Date Noted  . Influenza A [J10.1] 07/17/2017  . Lactic acidosis [E87.2]   . Cough [R05]   . Nausea and vomiting [R11.2]   . Severe episode of recurrent major depressive disorder, without psychotic features ([place]) [F33.2]   . MDD (major depressive disorder), single episode, severe with psychotic features ([place]) [F32.3] 06/27/2017  . MDD (major depressive disorder), severe ([place]) [F32.2] 06/23/2017  . Essential hypertension [I10] 01/13/2016  . Coronary artery disease involving native coronary artery of native heart without angina pectoris [I25.10] 01/13/2016  . Unstable angina ([place]) [I20.0]   . ED (erectile dysfunction) [N52.9] 02/15/2014  . Allergy to contrast media (used for diagnostic x-rays) [Z91.041] 05/12/2011  . Hypertension [I10] 05/09/2011  . Hyperlipidemia [E78.5] 05/09/2011  . DM2 (diabetes mellitus, type 2) ([place]) [E11.9] 07/10/2010  . CAD S/P percutaneous coronary angioplasty [I25.10, Z98.61] 07/10/2010  . DYSPEPSIA&OTHER [facility] DISORDERS FUNCTION STOMACH [K31.89, R10.13] 07/10/2010   Past Psychiatric History: Patient has had other prior psychiatric admissions.  Reports history of depression, denies history of psychosis, denies history of mania, denies history of violence, describes some hx agoraphobia, but does not endorse panic attacks.  History of prior suicide attempt in 57 by driving off road, and in 2005 by overdosing medications.  Past Medical History:   Past Medical History:  Diagnosis Date  . Arthritis   . Chronic lower back pain   . Coronary artery disease    a. Multiple prior caths/PCI. Cath 2013 with possible spasm of RCA, 70% ISR of mid LCx with subsequent DES to mLCx and prox LCX. b. H/o microvascular angina. c. Recurrent angina 08/2014 - s/p PTCA/DES to prox Cx, PTCA/CBA to OM1.  c. LHC 06/10/15 with patent stents and some ISR in LCX and OM-1 that was not flow limiting --> Rx   . Dyslipidemia    a. Intolerant to many statins except tolerating Livalo.  Marland Kitchen GERD (gastroesophageal reflux disease)   . Hypertension   . Myocardial infarction ([place]) ~ 2010  . S/P angioplasty with stent, DES, to proximal and mid LCX 12/15/11 12/15/2011  . Type II diabetes mellitus ([place])     Past Surgical History:  Procedure Laterality Date  . CARDIAC CATHETERIZATION  06/15/2002   LAD with prox 40% stenosis, norma L main, Cfx with 25% lesion, RCA with long mid 25% stenosis (Dr. Vita Barley)  . CARDIAC CATHETERIZATION  04/01/2010   normal L main, LAD wit mild stenosis, L Cfx with 70% in-stent restenosis, RCA with 70% in-stent restenosis, LVEF >60% (Dr. K. Mali Hilty) - cutting ballon arthrectomy to RCA & Cfx (Dr. Rockne Menghini)  . CARDIAC CATHETERIZATION  08/25/2010   preserved global LV contractility; multivessel CAD, diffuse 90-95% in-stent restenosis in prox placed Cfx stent - cutting balloon arthrectomy in Cfx with multiple dilatations 90-95% to 0% stenosis (Dr. Corky Downs)  . CARDIAC CATHETERIZATION  01/26/2011   PCI & stenting of aggresive in-stent restenosis within previously stented AV groove Cfx with 3.0x68mm Taxus DES (previous stents were Promus) (Dr.  Adora Fridge)  . CARDIAC CATHETERIZATION  05/11/2011   preserved LV function, 40% mid LAD stenosis, 30-40% narrowing proximal to stented semgnet of prox Cfx, patent mid RCA stent with smooth 20% narrowing in distal RCA (Dr. Corky Downs)  . CARDIAC CATHETERIZATION  12/15/2011   PCI & stenting of proximal & mid Cfx with  DES - 3.0x74mm in proximal, 3.0x61mm in mid (Dr. Adora Fridge)  . CARDIAC CATHETERIZATION N/A 06/10/2015   Procedure: Left Heart Cath and Coronary Angiography;  Surgeon: Jolaine Artist, MD;  Location: [facility];  Service: Cardiovascular;  Laterality: N/A;  . cardiometabolic testing  0/73/7106   good exercise effort, peak VO2 79% predicted with normal VO2 HR curves (mild deconditioning)  . EXCISIONAL HEMORRHOIDECTOMY  1984  . LEFT HEART CATHETERIZATION WITH CORONARY ANGIOGRAM N/A 05/11/2011   Procedure: LEFT HEART CATHETERIZATION WITH CORONARY ANGIOGRAM;  Surgeon: Troy Sine, MD;  Location: [facility] CATH LAB;  Service: Cardiovascular;  Laterality: N/A;  Possible percutaneous coronary intervention, possible IVUS  . LEFT HEART CATHETERIZATION WITH CORONARY ANGIOGRAM N/A 12/15/2011   Procedure: LEFT HEART CATHETERIZATION WITH CORONARY ANGIOGRAM;  Surgeon: Lorretta Harp, MD;  Location: [facility] CATH LAB;  Service: Cardiovascular;  Laterality: N/A;  . LEFT HEART CATHETERIZATION WITH CORONARY ANGIOGRAM N/A 09/05/2014   Procedure: LEFT HEART CATHETERIZATION WITH CORONARY ANGIOGRAM;  Surgeon: Burnell Blanks, MD;  Location: [facility] CATH LAB;  Service: Cardiovascular;  Laterality: N/A;  . LIPOMA EXCISION     back of the head  . NM MYOCAR PERF WALL MOTION  02/2012   lexiscan myoview; mild perfusion defect in mid inferolateral & basal inferolateral region (infarct/scar); EF 52%, abnormal but ow risk scan  . PERCUTANEOUS CORONARY STENT INTERVENTION (PCI-S)  09/05/2014   Procedure: PERCUTANEOUS CORONARY STENT INTERVENTION (PCI-S);  Surgeon: Burnell Blanks, MD;  Location: [facility] CATH LAB;  Service: Cardiovascular;;   Family History:  Family History  Problem Relation Age of Onset  . Leukemia Mother   . Prostate cancer Father   . Coronary artery disease Paternal Grandmother   . Cancer Paternal Grandfather   . Cancer Brother    Family Psychiatric  History: history of alcohol dependence in family (  uncles). Reports grandmother and an uncle committed suicide   Social History:  Social History   Substance and Sexual Activity  Alcohol Use Yes     Social History   Substance and Sexual Activity  Drug Use No   Comment: 12/15/11 "last cocaine was 2010"    Social History   Socioeconomic History  . Marital status: Divorced    Spouse name: Not on file  . Number of children: 2  . Years of education: GED  . Highest education level: Not on file  Occupational History  . Not on file  Social Needs  . Financial resource strain: Not on file  . Food insecurity:    Worry: Not on file    Inability: Not on file  . Transportation needs:    Medical: Not on file    Non-medical: Not on file  Tobacco Use  . Smoking status: Former Smoker    Packs/day: 1.00    Years: 10.00    Pack years: 10.00    Types: Cigarettes    Last attempt to quit: 04/07/2009    Years since quitting: 8.4  . Smokeless tobacco: Never Used  Substance and Sexual Activity  . Alcohol use: Yes  . Drug use: No    Comment: 12/15/11 "last cocaine was 2010"  . Sexual  activity: Not Currently  Lifestyle  . Physical activity:    Days per week: Not on file    Minutes per session: Not on file  . Stress: Not on file  Relationships  . Social connections:    Talks on phone: Not on file    Gets together: Not on file    Attends religious service: Not on file    Active member of club or organization: Not on file    Attends meetings of clubs or organizations: Not on file    Relationship status: Not on file  Other Topics Concern  . Not on file  Social History Narrative  . Not on file   Hospital Course: (Per Md's Admission SRA): 57 year old male , widowed - wife passed away from complications of breast cancer last year. Has two children, ages 35, 46. Lives alone. On disability. Presents to hospital voluntarily. He is known to our unit from prior admission in December  2018. At the time he was admitted for worsening depression and  suicidal ideations. Was diagnosed with MDD, was discharged on Abilify, Remeron. Reports worsening depression over the last few weeks, and also describes increasing anxiety , panic symptoms in public or social interactions. He states he has been isolating more , and describes neuro-vegetative symptoms of depression as below- reports hypersomnia, erratic appetite, low energy level, anhedonia. Also descries passive SI.  He reports history of alcohol dependence, states relapsed earlier this year, but has now been sober x 3 weeks. Denies drug abuse. He states " I was doing really good, feeling a lot better so I stopped the medications, because I felt I did not need them anymore".  After the above admission admission assessment, Carlyn was started on the medication regimen for his presenting symptoms. He was medicated & discharged on; Gabapentin 100 mg for agitation, Hydroxyzine 25 mg prn for anxiety, Mirtazapine 15 mg for depression/insomnia & Trazodone 50 mg for insomnia. He was also enrolled & participated in the group counseling sessions being offered & held on this unit. He learned coping skills that should help him after discharge to cope better. He was resumed on all his pertinent home medications for those pre-existing health issues presented. He tolerated his treatment regimen without any adverse effects or reactions reported.  As his treatment progressed, daily assessment notes marked improvement in Jeremy's symptoms. He says feels good. Plans to engage with [facility] for further substance abuse treatment after discharge. He is optimistic about the future. There are no psychotic features. No craving for substances. No anxiety. The features of depression has markedly improved since starting medications. No thoughts of violence. No access to weapons. No new stressors.    During the care review & discussion of Dontravious's progress this morning at the treatment team meeting. The team members feel that  patient is back to his baseline level of function. The team agrees with plan to discharge patient today to continue substance abuse treatment at the [facility] treatment center. And for mental health care & medication management, Duayne will be receiving these services at the [facility] here in [place], [place]. He is provided with all the necessary information needed to make these appointments without any problems. He left [facility] with all personal belongings in no apparent distress. Transportation per bus services. [place] assisted with bus pass. He was provided with a 14 days worth, supply samples of his [facility] discharge medication. He left [facility] in apparent distress.  Physical Findings: AIMS: Facial and Oral Movements  Muscles of Facial Expression: None, normal Lips and Perioral Area: None, normal Jaw: None, normal Tongue: None, normal,Extremity Movements Upper (arms, wrists, hands, fingers): None, normal Lower (legs, knees, ankles, toes): None, normal, Trunk Movements Neck, shoulders, hips: None, normal, Overall Severity Severity of abnormal movements (highest score from questions above): None, normal Incapacitation due to abnormal movements: None, normal Patient's awareness of abnormal movements (rate only patient's report): No Awareness, Dental Status Current problems with teeth and/or dentures?: No Does patient usually wear dentures?: No  CIWA:    COWS:     Musculoskeletal: Strength & Muscle Tone: within normal limits Gait & Station: normal Patient leans: N/A  Psychiatric Specialty Exam: Physical Exam  Nursing note and vitals reviewed. Constitutional: He is oriented to person, place, and time. He appears well-developed and well-nourished.  Respiratory: Effort normal.  Musculoskeletal: Normal range of motion.  Neurological: He is alert and oriented to person, place, and time.  Skin: Skin is warm.    Review of Systems  Constitutional: Negative.   HENT: Negative.   Eyes:  Negative.   Respiratory: Negative.   Cardiovascular: Negative.   Gastrointestinal: Negative.   Genitourinary: Negative.   Musculoskeletal: Negative.   Skin: Negative.   Neurological: Negative.   Endo/Heme/Allergies: Negative.   Psychiatric/Behavioral: Negative.     Blood pressure (!) 146/100, pulse 62, temperature 98.2 F (36.8 C), temperature source Oral, resp. rate 16, height 5\' 8"  (1.727 m), weight 84.4 kg (186 lb).Body mass index is 28.28 kg/m.  See Md's SRA   Have you used any form of tobacco in the last 30 days? (Cigarettes, Smokeless Tobacco, Cigars, and/or Pipes): No  Has this patient used any form of tobacco in the last 30 days? (Cigarettes, Smokeless Tobacco, Cigars, and/or Pipes) , No  Blood Alcohol level:  Lab Results  Component Value Date   ETH <10 06/21/2017   ETH <5 11/94/1740    Metabolic Disorder Labs:  Lab Results  Component Value Date   HGBA1C 6.0 (H) 10/03/2017   MPG 125.5 10/03/2017   MPG 128.37 06/25/2017   No results found for: PROLACTIN Lab Results  Component Value Date   CHOL 144 10/03/2017   TRIG 157 (H) 10/03/2017   HDL 51 10/03/2017   CHOLHDL 2.8 10/03/2017   VLDL 31 10/03/2017   LDLCALC 62 10/03/2017   LDLCALC 85 07/28/2017   See Psychiatric Specialty Exam and Suicide Risk Assessment completed by Attending Physician prior to discharge.  Discharge destination:  [facility]  Is patient on multiple antipsychotic therapies at discharge:  No   Has Patient had three or more failed trials of antipsychotic monotherapy by history:  No  Recommended Plan for Multiple Antipsychotic Therapies: NA  Allergies as of 10/04/2017      Reactions   Bee Venom Anaphylaxis, Hives   Shellfish Allergy Anaphylaxis, Hives   Statins Other (See Comments)   Myalgias. Tolerating livalo.   Testosterone Cypionate    Testerone Injection --Increased breast tissue       Medication List    STOP taking these medications   benzonatate 100 MG  capsule Commonly known as:  TESSALON   COENZYME Q10 PO   Fish Oil 600 MG Caps   lidocaine 5 % Commonly known as:  LIDODERM   nitroGLYCERIN 0.4 MG SL tablet Commonly known as:  NITROSTAT   testosterone 50 MG/5GM (1%) Gel Commonly known as:  ANDROGEL   traMADol 50 MG tablet Commonly known as:  ULTRAM     TAKE these medications  Indication  ARIPiprazole 2 MG tablet Commonly known as:  ABILIFY Take 1 tablet (2 mg total) by mouth daily. For mood control Start taking on:  10/05/2017  Indication:  Mood control   aspirin EC 81 MG tablet Take 1 tablet (81 mg total) by mouth daily. For heart heart What changed:  additional instructions  Indication:  Heart health   carvedilol 12.5 MG tablet Commonly known as:  COREG Take 1 tablet (12.5 mg total) by mouth 2 (two) times daily with a meal. For high blood pressure What changed:  additional instructions  Indication:  Cardiac issues   gabapentin 100 MG capsule Commonly known as:  NEURONTIN Take 1 capsule (100 mg total) by mouth 3 (three) times daily. For agitation/Neuropathic pain What changed:  additional instructions  Indication:  Neuropathic Pain   hydrOXYzine 25 MG tablet Commonly known as:  ATARAX/VISTARIL Take 1 tablet (25 mg total) by mouth 3 (three) times daily as needed for anxiety.  Indication:  Feeling Anxious   lisinopril 10 MG tablet Commonly known as:  PRINIVIL,ZESTRIL Take 1 tablet (10 mg total) by mouth daily. For high blood pressure Start taking on:  10/05/2017  Indication:  High Blood Pressure Disorder   metFORMIN 500 MG 24 hr tablet Commonly known as:  GLUCOPHAGE-XR Take 1 tablet (500 mg total) by mouth daily with breakfast. For diabetes management Start taking on:  10/05/2017 What changed:  additional instructions  Indication:  Type 2 Diabetes   mirtazapine 15 MG tablet Commonly known as:  REMERON Take 1 tablet (15 mg total) by mouth at bedtime. For depression/sleep What changed:    medication  strength  how much to take  additional instructions  Indication:  Major Depressive Disorder, Insomnia   omega-3 acid ethyl esters 1 g capsule Commonly known as:  LOVAZA Take 1 capsule (1 g total) by mouth 2 (two) times daily. For high cholesterol  Indication:  High Amount of Triglycerides in the Blood   traZODone 50 MG tablet Commonly known as:  DESYREL Take 1 tablet (50 mg total) by mouth at bedtime as needed for sleep.  Indication:  Trouble Sleeping      Follow-up Information    [facility].   Specialty:  Behavioral Health Why:  Walk in within 3 days of hospital/rehab discharge to be assessed for outpatient mental health services including Medication management and therapy. Walk in hours: Monday-Friday 8am-9am. Thank you.  Contact information: [place] [address] [phone]        Services, [facility] Follow up on 10/05/2017.   Why:  Screening for possible admission on Tuesday, 10/05/17 at 8:00AM. Please bring Photo ID/Proof of [facility] residency, medications provided by hospital, and clothing. Call facility when at [facility] at 7:45AM for transport per June in admissions.  Contact information: [place] [address] [phone]          Follow-up recommendations: Activity:  As tolerated Diet: As recommended by your primary care doctor. Keep all scheduled follow-up appointments as recommended.  Comments:  Patient is instructed prior to discharge to: Take all medications as prescribed by his/her mental healthcare provider. Report any adverse effects and or reactions from the medicines to his/her outpatient provider promptly. Patient has been instructed & cautioned: To not engage in alcohol and or illegal drug use while on prescription medicines. In the event of worsening symptoms, patient is instructed to call the crisis hotline, 911 and or go to the nearest ED for appropriate evaluation  and treatment of symptoms. To  follow-up with his/her primary care provider for your other medical issues, concerns and or health care needs.   Signed: Lindell Spar, NP, PMHNP, FNP-BC 10/04/2017, 3:13 PM   Patient seen, Suicide Assessment Completed.  Disposition Plan Reviewed

## 2017-10-04 NOTE — Progress Notes (Signed)
Pt discharged home in a bus pass. Pt was ambulatory, stable and appreciative at that time. All papers and prescriptions, and medication sample  were given and valuables returned. Verbal understanding expressed. Denies SI/HI and A/VH. Pt given opportunity to express concerns and ask questions.

## 2017-10-04 NOTE — Progress Notes (Addendum)
  [facility] Adult Case Management Discharge Plan :  Will you be returning to the same living situation after discharge:  No. Pt has screening for possible admission at [facility].  At discharge, do you have transportation home?: Pt provided with bus passes and insturcted to call [facility] when at [facility] tomorrow morning at 7:45AM for safe transport.  Do you have the ability to pay for your medications: Yes,  [facility]  Release of information consent forms completed and submitted to medical records by CSW.  Patient to Follow up at: Follow-up Information    [facility] up.   Specialty:  Behavioral Health Why:  Walk in within 3 days of hospital/rehab discharge to be assessed for outpatient mental health services including Medication management and therapy. Walk in hours: Monday-Friday 8am-9am. Thank you.  Contact information: [place] [address] [phone]        Services, [facility] Follow up on 10/05/2017.   Why:  Screening for possible admission on Tuesday, 10/05/17 at 8:00AM. Please bring Photo ID/Proof of [facility] residency, medications provided by hospital, and clothing. Call facility when at [facility] at 7:45AM for transport per June in admissions.  Contact information: [place] [address] [phone]           Next level of care provider has access to [place] [facility] and Suicide Prevention discussed: Yes,  SPE completed with pt; pt declined to consent to collateral contact. SPI pamphlet and Mobile Crisis information provided to pt.   Have you used any form of tobacco in the last 30 days? (Cigarettes, Smokeless Tobacco, Cigars, and/or Pipes): No  Has patient been referred to the Quitline?: N/A patient is not a smoker  Patient has been referred for addiction treatment: Yes  [employer], LCSW 10/04/2017, 3:07 PM

## 2017-10-04 NOTE — [facility] Suicide Risk Assessment (Addendum)
[facility] Discharge Suicide Risk Assessment   Principal Problem: MDD (major depressive disorder), severe [facility]) Discharge Diagnoses:  Patient Active Problem List   Diagnosis Date Noted  . Influenza A [J10.1] 07/17/2017  . Lactic acidosis [E87.2]   . Cough [R05]   . Nausea and vomiting [R11.2]   . Severe episode of recurrent major depressive disorder, without psychotic features ([place]) [F33.2]   . MDD (major depressive disorder), single episode, severe with psychotic features ([place]) [F32.3] 06/27/2017  . MDD (major depressive disorder), severe ([place]) [F32.2] 06/23/2017  . Essential hypertension [I10] 01/13/2016  . Coronary artery disease involving native coronary artery of native heart without angina pectoris [I25.10] 01/13/2016  . Unstable angina ([place]) [I20.0]   . ED (erectile dysfunction) [N52.9] 02/15/2014  . Allergy to contrast media (used for diagnostic x-rays) [Z91.041] 05/12/2011  . Hypertension [I10] 05/09/2011  . Hyperlipidemia [E78.5] 05/09/2011  . DM2 (diabetes mellitus, type 2) ([place]) [E11.9] 07/10/2010  . CAD S/P percutaneous coronary angioplasty [I25.10, Z98.61] 07/10/2010  . DYSPEPSIA&OTHER SPEC DISORDERS FUNCTION STOMACH [K31.89, R10.13] 07/10/2010    Total Time spent with patient: 30 minutes  Musculoskeletal: Strength & Muscle Tone: within normal limits Gait & Station: normal Patient leans: N/A  Psychiatric Specialty Exam: ROS denies chest pain, denies shortness of breath, denies vomiting , no fever, no chills   Blood pressure (!) 146/100, pulse 62, temperature 98.2 F (36.8 C), temperature source Oral, resp. rate 16, height 5\' 8"  (1.727 m), weight 84.4 kg (186 lb).Body mass index is 28.28 kg/m.  General Appearance: Well Groomed  Eye Contact::  Good  Speech:  Normal Rate409  Volume:  Normal  Mood:  reports mood is improved, feels " a lot better"  Affect:  Appropriate and Full Range  Thought Process:  Linear and Descriptions of Associations: Intact  Orientation:  Full  (Time, Place, and Person)  Thought Content:  no hallucinations, no delusions, not internally preoccupied   Suicidal Thoughts:  No currently denies suicidal or self injurious ideations, denies any homicidal or violent ideations , future oriented   Homicidal Thoughts:  No  Memory:  recent and remote grossly intact   Judgement:  Other:  improving   Insight:  improving   Psychomotor Activity:  Normal  Concentration:  Good  Recall:  Good  Fund of Knowledge:Good  Language: Good  Akathisia:  Negative  Handed:  Right  AIMS (if indicated):     Assets:  Communication Skills Desire for Improvement Resilience  Sleep:  Number of Hours: 5.75  Cognition: WNL  ADL's:  Intact   Mental Status Per Nursing Assessment::   On Admission:  Suicidal ideation indicated by patient  Demographic Factors:  57 year old male,widowed, two adult children, lives alone, on disability  Loss Factors: Loss of loved ones, disability  Historical Factors: History of depression, history of prior psychiatric admissions , history of alcohol use disorder   Risk Reduction Factors:   Positive coping skills or problem solving skills  Continued Clinical Symptoms:  Patient presents alert, attentive, well related, mood improved and affect brighter and more reactive, no thought disorder, no suicidal or self injurious ideations, no homicidal or violent ideations, no hallucinations, no delusions, future oriented . Denies medication side effects. Presents future oriented .  Behavior on unit calm, presents pleasant on approach. Patient is interested in going to [facility] rehab .    Cognitive Features That Contribute To Risk:  No gross cognitive deficits noted upon discharge. Is alert , attentive, and oriented x 3   Suicide Risk:  Mild:  Suicidal ideation of limited frequency, intensity, duration, and specificity.  There are no identifiable plans, no associated intent, mild dysphoria and related symptoms, good self-control  (both objective and subjective assessment), few other risk factors, and identifiable protective factors, including available and accessible social support.  Follow-up Information    [facility] Follow up.   Specialty:  Behavioral Health Why:  Walk in within 3 days of hospital/rehab discharge to be assessed for outpatient mental health services including Medication management and therapy. Walk in hours: Monday-Friday 8am-9am. Thank you.  Contact information: [place] [address] [phone]        Services, [facility] Follow up on 10/05/2017.   Why:  Screening for possible admission on Tuesday, 10/05/17 at 8:00AM. Please bring Photo ID/Proof of [facility] residency, medications provided by hospital, and clothing. Call facility when at [facility] at 7:45AM for transport per June in admissions.  Contact information: [address] [place] [address] [phone]           Plan Of Care/Follow-up recommendations:  Activity:  as tolerated  Diet:  heart healthy, diabetic diet  Tests:  NA Other:  See below  Patient is expressing readiness for discharge and is leaving unit in good spirits , no current grounds for involuntary commitment  Plans to go to [facility] for screening and admission tomorrow in AM. States that today plans to return to apartment in order to review mail, get clothes, etc, in preparation for rehab . Patient to follow up with his  PCP for medical management and monitoring as needed .   Jenne Campus, MD 10/04/2017, 3:36 PM

## 2017-10-04 NOTE — Progress Notes (Signed)
Recreation Therapy Notes  Date: 4.8.19 Time: 9:30 a.m. Location: [address]   Group Topic: Stress Management   Goal Area(s) Addresses:  Goal 1.1: To reduce stress  -Patient will feel a reduction in stress level  -Patient will learn the importance of stress management  -Patient will participate during stress management group      Intervention: Stress Management   Activity: Meditation- Patients were in a peaceful environment with soft lighting enhancing patients mood. Patients listened to a body scan meditation to help decrease tension and stress levels    Education: Stress Management, Discharge Planning.    Education Outcome: Acknowledges edcuation/In group clarification offered/Needs additional education   Clinical Observations/Feedback:: Patient did not attend     Ranell Patrick, Recreation Therapy Intern   Ranell Patrick 10/04/2017 8:28 AM

## 2017-10-19 ENCOUNTER — Other Ambulatory Visit: Payer: Self-pay | Admitting: Family Medicine

## 2017-11-10 ENCOUNTER — Other Ambulatory Visit: Payer: Self-pay

## 2017-11-10 ENCOUNTER — Encounter (HOSPITAL_COMMUNITY): Payer: Self-pay

## 2017-11-10 ENCOUNTER — Emergency Department (HOSPITAL_COMMUNITY)
Admission: EM | Admit: 2017-11-10 | Discharge: 2017-11-10 | Disposition: A | Discharge status: Home / Self Care | Payer: Medicaid Other | Attending: Emergency Medicine | Admitting: Emergency Medicine

## 2017-11-10 DIAGNOSIS — J3489 Other specified disorders of nose and nasal sinuses: Secondary | ICD-10-CM | POA: Diagnosis not present

## 2017-11-10 DIAGNOSIS — Z7984 Long term (current) use of oral hypoglycemic drugs: Secondary | ICD-10-CM | POA: Diagnosis not present

## 2017-11-10 DIAGNOSIS — R0981 Nasal congestion: Secondary | ICD-10-CM | POA: Diagnosis present

## 2017-11-10 DIAGNOSIS — Z87891 Personal history of nicotine dependence: Secondary | ICD-10-CM | POA: Diagnosis not present

## 2017-11-10 DIAGNOSIS — Z7982 Long term (current) use of aspirin: Secondary | ICD-10-CM | POA: Insufficient documentation

## 2017-11-10 DIAGNOSIS — Z79899 Other long term (current) drug therapy: Secondary | ICD-10-CM | POA: Insufficient documentation

## 2017-11-10 DIAGNOSIS — J302 Other seasonal allergic rhinitis: Secondary | ICD-10-CM | POA: Diagnosis not present

## 2017-11-10 DIAGNOSIS — I2510 Atherosclerotic heart disease of native coronary artery without angina pectoris: Secondary | ICD-10-CM | POA: Diagnosis not present

## 2017-11-10 DIAGNOSIS — I10 Essential (primary) hypertension: Secondary | ICD-10-CM | POA: Insufficient documentation

## 2017-11-10 DIAGNOSIS — Z955 Presence of coronary angioplasty implant and graft: Secondary | ICD-10-CM | POA: Insufficient documentation

## 2017-11-10 DIAGNOSIS — E119 Type 2 diabetes mellitus without complications: Secondary | ICD-10-CM | POA: Insufficient documentation

## 2017-11-10 MED ORDER — NICOTINE 14 MG/24HR TD PT24: 14.0000 mg | MEDICATED_PATCH | Freq: Every day | TRANSDERMAL | 0 refills | Status: DC

## 2017-11-10 MED ORDER — FLUTICASONE PROPIONATE 50 MCG/ACT NA SUSP: 1.0000 | Freq: Every day | NASAL | 0 refills | Status: DC

## 2017-11-10 NOTE — ED Notes (Signed)
Care assumed at this time; sitting upright on bedside without complaint; d/c instructions reviewed - pt verbalized understanding

## 2017-11-10 NOTE — ED Triage Notes (Signed)
Pt. From home reports experiencing major nasal congestion. Pt. Describes mucus as clear. Pt. Has not taken anything for it. A/O X4.

## 2017-11-10 NOTE — ED Provider Notes (Signed)
[place] EMERGENCY DEPARTMENT Provider Note   CSN: [id number] Arrival date & time: 11/10/17  4403     History   Chief Complaint No chief complaint on file.   HPI [name] is a 57 y.o. male.  HPI Patient presents with nasal congestion and rhinorrhea.  States it is clear.  Has been going on for around a month.  Comes out both nostrils and down the back of his throat.  No fevers.  No cough.  States he does have seasonal allergies.  He is not taking anything for it but states he is recently started smoking again.  States that when he smokes he gets worse. Past Medical History:  Diagnosis Date  . Arthritis   . Chronic lower back pain   . Coronary artery disease    a. Multiple prior caths/PCI. Cath 2013 with possible spasm of RCA, 70% ISR of mid LCx with subsequent DES to mLCx and prox LCX. b. H/o microvascular angina. c. Recurrent angina 08/2014 - s/p PTCA/DES to prox Cx, PTCA/CBA to OM1.  c. LHC 06/10/15 with patent stents and some ISR in LCX and OM-1 that was not flow limiting --> Rx   . Dyslipidemia    a. Intolerant to many statins except tolerating Livalo.  Marland Kitchen GERD (gastroesophageal reflux disease)   . Hypertension   . Myocardial infarction ([place]) ~ 2010  . S/P angioplasty with stent, DES, to proximal and mid LCX 12/15/11 12/15/2011  . Type II diabetes mellitus [facility])     Patient Active Problem List   Diagnosis Date Noted  . Influenza A 07/17/2017  . Lactic acidosis   . Cough   . Nausea and vomiting   . Severe episode of recurrent major depressive disorder, without psychotic features ([place])   . MDD (major depressive disorder), single episode, severe with psychotic features ([place]) 06/27/2017  . MDD (major depressive disorder), severe ([place]) 06/23/2017  . Essential hypertension 01/13/2016  . Coronary artery disease involving native coronary artery of native heart without angina pectoris 01/13/2016  . Unstable angina ([place])   . ED (erectile dysfunction)  02/15/2014  . Allergy to contrast media (used for diagnostic x-rays) 05/12/2011  . Hypertension 05/09/2011  . Hyperlipidemia 05/09/2011  . DM2 (diabetes mellitus, type 2) ([place]) 07/10/2010  . CAD S/P percutaneous coronary angioplasty 07/10/2010  . DYSPEPSIA&OTHER [facility] DISORDERS FUNCTION STOMACH 07/10/2010    Past Surgical History:  Procedure Laterality Date  . CARDIAC CATHETERIZATION  06/15/2002   LAD with prox 40% stenosis, norma L main, Cfx with 25% lesion, RCA with long mid 25% stenosis (Dr. Vita Barley)  . CARDIAC CATHETERIZATION  04/01/2010   normal L main, LAD wit mild stenosis, L Cfx with 70% in-stent restenosis, RCA with 70% in-stent restenosis, LVEF >60% (Dr. K. Mali Hilty) - cutting ballon arthrectomy to RCA & Cfx (Dr. Rockne Menghini)  . CARDIAC CATHETERIZATION  08/25/2010   preserved global LV contractility; multivessel CAD, diffuse 90-95% in-stent restenosis in prox placed Cfx stent - cutting balloon arthrectomy in Cfx with multiple dilatations 90-95% to 0% stenosis (Dr. Corky Downs)  . CARDIAC CATHETERIZATION  01/26/2011   PCI & stenting of aggresive in-stent restenosis within previously stented AV groove Cfx with 3.0x90mm Taxus DES (previous stents were Promus) (Dr. Adora Fridge)  . CARDIAC CATHETERIZATION  05/11/2011   preserved LV function, 40% mid LAD stenosis, 30-40% narrowing proximal to stented semgnet of prox Cfx, patent mid RCA stent with smooth 20% narrowing in distal RCA (Dr. Corky Downs)  . CARDIAC  CATHETERIZATION  12/15/2011   PCI & stenting of proximal & mid Cfx with DES - 3.0x38mm in proximal, 3.0x71mm in mid (Dr. Adora Fridge)  . CARDIAC CATHETERIZATION N/A 06/10/2015   Procedure: Left Heart Cath and Coronary Angiography;  Surgeon: Jolaine Artist, MD;  Location: [facility] CV LAB;  Service: Cardiovascular;  Laterality: N/A;  . cardiometabolic testing  07/01/7251   good exercise effort, peak VO2 79% predicted with normal VO2 HR curves (mild deconditioning)  . EXCISIONAL  HEMORRHOIDECTOMY  1984  . LEFT HEART CATHETERIZATION WITH CORONARY ANGIOGRAM N/A 05/11/2011   Procedure: LEFT HEART CATHETERIZATION WITH CORONARY ANGIOGRAM;  Surgeon: Troy Sine, MD;  Location: [facility] CATH LAB;  Service: Cardiovascular;  Laterality: N/A;  Possible percutaneous coronary intervention, possible IVUS  . LEFT HEART CATHETERIZATION WITH CORONARY ANGIOGRAM N/A 12/15/2011   Procedure: LEFT HEART CATHETERIZATION WITH CORONARY ANGIOGRAM;  Surgeon: Lorretta Harp, MD;  Location: [facility] CATH LAB;  Service: Cardiovascular;  Laterality: N/A;  . LEFT HEART CATHETERIZATION WITH CORONARY ANGIOGRAM N/A 09/05/2014   Procedure: LEFT HEART CATHETERIZATION WITH CORONARY ANGIOGRAM;  Surgeon: Burnell Blanks, MD;  Location: [facility] CATH LAB;  Service: Cardiovascular;  Laterality: N/A;  . LIPOMA EXCISION     back of the head  . NM MYOCAR PERF WALL MOTION  02/2012   lexiscan myoview; mild perfusion defect in mid inferolateral & basal inferolateral region (infarct/scar); EF 52%, abnormal but ow risk scan  . PERCUTANEOUS CORONARY STENT INTERVENTION (PCI-S)  09/05/2014   Procedure: PERCUTANEOUS CORONARY STENT INTERVENTION (PCI-S);  Surgeon: Burnell Blanks, MD;  Location: [facility] CATH LAB;  Service: Cardiovascular;;        Home Medications    Prior to Admission medications   Medication Sig Start Date End Date Taking? Authorizing Provider  ARIPiprazole (ABILIFY) 2 MG tablet Take 1 tablet (2 mg total) by mouth daily. For mood control 10/05/17   Lindell Spar I, NP  aspirin EC 81 MG tablet Take 1 tablet (81 mg total) by mouth daily. For heart heart 10/04/17   Lindell Spar I, NP  carvedilol (COREG) 12.5 MG tablet Take 1 tablet (12.5 mg total) by mouth 2 (two) times daily with a meal. For high blood pressure 10/04/17   Nwoko, Herbert Pun I, NP  fluticasone (FLONASE) 50 MCG/ACT nasal spray Place 1 spray into both nostrils daily. 11/10/17   Davonna Belling, MD  gabapentin (NEURONTIN) 100 MG capsule Take 1 capsule (100 mg  total) by mouth 3 (three) times daily. For agitation/Neuropathic pain 10/04/17   Lindell Spar I, NP  hydrOXYzine (ATARAX/VISTARIL) 25 MG tablet Take 1 tablet (25 mg total) by mouth 3 (three) times daily as needed for anxiety. 10/04/17   Lindell Spar I, NP  lisinopril (PRINIVIL,ZESTRIL) 10 MG tablet Take 1 tablet (10 mg total) by mouth daily. For high blood pressure 10/05/17   Nwoko, Herbert Pun I, NP  lisinopril (PRINIVIL,ZESTRIL) 5 MG tablet TAKE 1 TABLET BY MOUTH ONCE DAILY 10/19/17   Tresa Garter, MD  metFORMIN (GLUCOPHAGE) 500 MG tablet TAKE 1 TABLET BY MOUTH ONCE DAILY WITH BREAKFAST 10/19/17   Tresa Garter, MD  metFORMIN (GLUCOPHAGE-XR) 500 MG 24 hr tablet Take 1 tablet (500 mg total) by mouth daily with breakfast. For diabetes management 10/05/17   Lindell Spar I, NP  mirtazapine (REMERON) 15 MG tablet Take 1 tablet (15 mg total) by mouth at bedtime. For depression/sleep 10/04/17   Lindell Spar I, NP  nicotine (NICODERM CQ - DOSED IN MG/24 HOURS) 14 mg/24hr patch Place 1 patch (  14 mg total) onto the skin daily. 11/10/17   Davonna Belling, MD  omega-3 acid ethyl esters (LOVAZA) 1 g capsule Take 1 capsule (1 g total) by mouth 2 (two) times daily. For high cholesterol 10/04/17   Lindell Spar I, NP  traZODone (DESYREL) 50 MG tablet Take 1 tablet (50 mg total) by mouth at bedtime as needed for sleep. 10/04/17   Encarnacion Slates, NP    Family History Family History  Problem Relation Age of Onset  . Leukemia Mother   . Prostate cancer Father   . Coronary artery disease Paternal Grandmother   . Cancer Paternal Grandfather   . Cancer Brother     Social History Social History   Tobacco Use  . Smoking status: Former Smoker    Packs/day: 1.00    Years: 10.00    Pack years: 10.00    Types: Cigarettes    Last attempt to quit: 04/07/2009    Years since quitting: 8.6  . Smokeless tobacco: Never Used  Substance Use Topics  . Alcohol use: Yes  . Drug use: No    Comment: 12/15/11 "last cocaine was  2010"     Allergies   Bee venom; Shellfish allergy; Statins; and Testosterone cypionate   Review of Systems Review of Systems  Constitutional: Negative for appetite change and fatigue.  HENT: Positive for congestion, postnasal drip and rhinorrhea. Negative for sinus pressure, sneezing, sore throat and trouble swallowing.   Respiratory: Negative for cough.   Cardiovascular: Negative for chest pain.  Gastrointestinal: Negative for abdominal pain.  Musculoskeletal: Negative for back pain.  Neurological: Negative for weakness.     Physical Exam Updated Vital Signs BP (!) 131/103   Pulse 61   Temp 98.3 F (36.8 C) (Oral)   Resp 20   Ht 5\' 8"  (1.727 m)   Wt 77.1 kg (170 lb)   SpO2 99%   BMI 25.85 kg/m   Physical Exam  Constitutional: He appears well-developed.  HENT:  Head: Atraumatic.  Mouth/Throat: No oropharyngeal exudate.  Edema bilateral nasal turbinates  And mucosa  Eyes: Pupils are equal, round, and reactive to light.  Cardiovascular: Normal rate.  Pulmonary/Chest: Effort normal.  Abdominal: Soft.  Musculoskeletal: He exhibits no edema.  Neurological: He is alert.     ED Treatments / Results  Labs (all labs ordered are listed, but only abnormal results are displayed) Labs Reviewed - No data to display  EKG None  Radiology No results found.  Procedures Procedures (including critical care time)  Medications Ordered in ED Medications - No data to display   Initial Impression / Assessment and Plan / ED Course  I have reviewed the triage vital signs and the nursing notes.  Pertinent labs & imaging results that were available during my care of the patient were reviewed by me and considered in my medical decision making (see chart for details).    Patient with rhinorrhea.  May be allergic versus viral.  Will give Flonase and discussed  decongestants.  Also requested nicotine patch  to help quit smoking. Final Clinical Impressions(s) / ED Diagnoses    Final diagnoses:  Rhinorrhea  Seasonal allergies    ED Discharge Orders        Ordered    fluticasone (FLONASE) 50 MCG/ACT nasal spray  Daily     11/10/17 0724    nicotine (NICODERM CQ - DOSED IN MG/24 HOURS) 14 mg/24hr patch  Daily     11/10/17 0725       Tanica Gaige,  Ovid Curd, MD 11/10/17 [phone]

## 2017-11-15 ENCOUNTER — Encounter: Payer: Self-pay | Admitting: Family Medicine

## 2017-11-15 ENCOUNTER — Ambulatory Visit (INDEPENDENT_AMBULATORY_CARE_PROVIDER_SITE_OTHER): Payer: Medicaid Other | Admitting: Family Medicine

## 2017-11-15 VITALS — BP 128/82 | HR 58 | Temp 97.6°F | Resp 16 | Ht 68.0 in | Wt 178.0 lb

## 2017-11-15 DIAGNOSIS — F322 Major depressive disorder, single episode, severe without psychotic features: Secondary | ICD-10-CM

## 2017-11-15 DIAGNOSIS — Z1211 Encounter for screening for malignant neoplasm of colon: Secondary | ICD-10-CM | POA: Diagnosis not present

## 2017-11-15 DIAGNOSIS — E785 Hyperlipidemia, unspecified: Secondary | ICD-10-CM

## 2017-11-15 DIAGNOSIS — Z9861 Coronary angioplasty status: Secondary | ICD-10-CM | POA: Diagnosis not present

## 2017-11-15 DIAGNOSIS — I10 Essential (primary) hypertension: Secondary | ICD-10-CM

## 2017-11-15 DIAGNOSIS — R7989 Other specified abnormal findings of blood chemistry: Secondary | ICD-10-CM | POA: Diagnosis not present

## 2017-11-15 DIAGNOSIS — I2510 Atherosclerotic heart disease of native coronary artery without angina pectoris: Secondary | ICD-10-CM | POA: Diagnosis not present

## 2017-11-15 DIAGNOSIS — E1159 Type 2 diabetes mellitus with other circulatory complications: Secondary | ICD-10-CM | POA: Diagnosis not present

## 2017-11-15 DIAGNOSIS — Z1159 Encounter for screening for other viral diseases: Secondary | ICD-10-CM | POA: Diagnosis not present

## 2017-11-15 NOTE — Patient Instructions (Addendum)
  Your blood pressure is at goal on current medication regimen, no changes warranted on today.  We will continue to monitor heart rate consistently being that is less than 60.  On normal heart rate ranges between 60 and 100. - Continue medication, monitor blood pressure at home. Continue DASH diet. Reminder to go to the ER if any CP, SOB, nausea, dizziness, severe HA, changes vision/speech, left arm numbness and tingling and jaw pain.    Reviewed your hemoglobin A1c, is 6.0 which is at goal.  Goal is less than 7.  Continue a low-salt, carbohydrate modify diet divided over 4-5 small meals throughout the day and continue your current exercise regimen. Continue to refrain from smoking Recommend 64 ounces of water per day during summer months. For varicose veins, no further treatment and evaluation warranted at this time.  If they worsen, please schedule a follow-up appointment and I will send a referral to the vein and vascular specialist.  For any abnormal laboratory results, I will contact you by phone. Continue to check blood pressure at home consistently    Follow-up with primary provider in 3 months.

## 2017-11-16 LAB — HEPATITIS C ANTIBODY: Hep C Virus Ab: <0.1 s/co ratio (ref 0.0–0.9)

## 2017-11-16 LAB — TESTOSTERONE: Testosterone: 432 ng/dL (ref 264–916)

## 2017-11-16 NOTE — Progress Notes (Signed)
Subjective:    Patient ID: [name], male    DOB: 25-Mar-1961, 57 y.o.   MRN: [id number]   [name], a 57 year old male with multiple co morbidities presents for a follow up of chronic conditions.    Diabetes  He has type 2 diabetes mellitus. His disease course has been stable. There are no hypoglycemic associated symptoms. Pertinent negatives for hypoglycemia include no nervousness/anxiousness or sweats. Pertinent negatives for diabetes include no blurred vision, no chest pain, no fatigue, no foot ulcerations, no polydipsia, no polyphagia, no polyuria, no weakness and no weight loss. There are no hypoglycemic complications. Symptoms are stable. Pertinent negatives for diabetic complications include no PVD or retinopathy. Risk factors for coronary artery disease include hypertension and tobacco exposure. He is compliant with treatment all of the time. He is following a high fiber and high salt diet. Meal planning includes avoidance of concentrated sweets. He has not had a previous visit with a dietitian. There is no change in his home blood glucose trend. He does not see a podiatrist.Eye exam is not current.  Hypertension  The problem is controlled. Pertinent negatives include no blurred vision, chest pain, neck pain, orthopnea, palpitations, peripheral edema, shortness of breath or sweats. Risk factors for coronary artery disease include obesity and sedentary lifestyle. There is no history of heart failure, left ventricular hypertrophy, PVD or retinopathy. There is no history of sleep apnea or a thyroid problem.  Erectile Dysfunction  This is a recurrent problem. The nature of his difficulty is achieving erection and maintaining erection (Patient has a history of testosterone deficiency). He reports no anxiety, decreased libido or performance anxiety.    Past Medical History:  Diagnosis Date  . Arthritis   . Chronic lower back pain   . Coronary artery disease    a. Multiple prior  caths/PCI. Cath 2013 with possible spasm of RCA, 70% ISR of mid LCx with subsequent DES to mLCx and prox LCX. b. H/o microvascular angina. c. Recurrent angina 08/2014 - s/p PTCA/DES to prox Cx, PTCA/CBA to OM1.  c. LHC 06/10/15 with patent stents and some ISR in LCX and OM-1 that was not flow limiting --> Rx   . Dyslipidemia    a. Intolerant to many statins except tolerating Livalo.  Marland Kitchen GERD (gastroesophageal reflux disease)   . Hypertension   . Myocardial infarction ([place]) ~ 2010  . S/P angioplasty with stent, DES, to proximal and mid LCX 12/15/11 12/15/2011  . Type II diabetes mellitus ([place])    Social History   Socioeconomic History  . Marital status: Divorced    Spouse name: Not on file  . Number of children: 2  . Years of education: GED  . Highest education level: Not on file  Occupational History  . Not on file  Social Needs  . Financial resource strain: Not on file  . Food insecurity:    Worry: Not on file    Inability: Not on file  . Transportation needs:    Medical: Not on file    Non-medical: Not on file  Tobacco Use  . Smoking status: Former Smoker    Packs/day: 1.00    Years: 10.00    Pack years: 10.00    Types: Cigarettes    Last attempt to quit: 04/07/2009    Years since quitting: 8.6  . Smokeless tobacco: Never Used  Substance and Sexual Activity  . Alcohol use: Yes  . Drug use: No    Comment: 12/15/11 "last cocaine  was 2010"  . Sexual activity: Not Currently  Lifestyle  . Physical activity:    Days per week: Not on file    Minutes per session: Not on file  . Stress: Not on file  Relationships  . Social connections:    Talks on phone: Not on file    Gets together: Not on file    Attends religious service: Not on file    Active member of club or organization: Not on file    Attends meetings of clubs or organizations: Not on file    Relationship status: Not on file  . Intimate partner violence:    Fear of current or ex partner: Not on file    Emotionally  abused: Not on file    Physically abused: Not on file    Forced sexual activity: Not on file  Other Topics Concern  . Not on file  Social History Narrative  . Not on file   Immunization History  Administered Date(s) Administered  . Influenza,inj,Quad PF,6+ Mos 07/28/2017  . Pneumococcal Polysaccharide-23 07/28/2017  . Pneumococcal-Unspecified 06/29/2009    Review of Systems  Constitutional: Negative.  Negative for fatigue and weight loss.  HENT: Negative.   Eyes: Negative.  Negative for blurred vision.  Respiratory: Negative.  Negative for shortness of breath.   Cardiovascular: Negative.  Negative for chest pain, palpitations, orthopnea and leg swelling.  Gastrointestinal: Negative.   Endocrine: Negative for polydipsia, polyphagia and polyuria.  Genitourinary: Negative.  Negative for decreased libido.  Musculoskeletal: Positive for back pain. Negative for neck pain.  Allergic/Immunologic: Negative.   Neurological: Negative.  Negative for weakness.  Hematological: Negative.   Psychiatric/Behavioral: The patient is not nervous/anxious.        Objective:   Physical Exam  Constitutional: He is oriented to person, place, and time. He appears well-developed and well-nourished.  Eyes: Pupils are equal, round, and reactive to light.  Neck: Normal range of motion.  Cardiovascular: Normal rate, regular rhythm and normal heart sounds.  Pulmonary/Chest: Effort normal and breath sounds normal.  Abdominal: Soft. Bowel sounds are normal.  Neurological: He is alert and oriented to person, place, and time.  Skin: Skin is warm and dry.  Psychiatric: He has a normal mood and affect. His behavior is normal. Judgment and thought content normal.       BP 128/82 (BP Location: Left Arm, Patient Position: Sitting, Cuff Size: Normal) Comment: MANUALLY  Pulse (!) 58   Temp 97.6 F (36.4 C) (Oral)   Resp 16   Ht 5\' 8"  (1.727 m)   Wt 178 lb (80.7 kg)   SpO2 100%   BMI 27.06 kg/m   Assessment & Plan:  1. Essential hypertension Blood pressure is at goal on current medication regimen. No medication changes warranted on today.  Continue medication, monitor blood pressure at home. Continue DASH diet. Reminded to go to the ER if any CP, SOB, nausea, dizziness, severe HA, changes vision/speech, left arm numbness and tingling and jaw pain.    2. Type 2 diabetes mellitus with other circulatory complication, without long-term current use of insulin ([name]) Hemoglobin a1c is 6.0, diabetes is controlled on current medication regimen.   3. Hyperlipidemia, unspecified hyperlipidemia type The 10-year ASCVD risk score Mikey Bussing DC Jr., et al., 2013) is: 18.8%   Values used to calculate the score:     Age: 42 years     Sex: Male     Is Non-Hispanic African American: Yes     Diabetic: Yes  Tobacco smoker: No     Systolic Blood Pressure: 426 mmHg     Is BP treated: Yes     HDL Cholesterol: 51 mg/dL     Total Cholesterol: 144 mg/dL  4. Low testosterone level in male - Testosterone  5. MDD (major depressive disorder), severe ([place]) Continue to follow up at [facility]   6. CAD S/P percutaneous coronary angioplasty Continue Coreg 12.5 mg BID, statin, and ASA therapy  7. Need for hepatitis C screening test - Hepatitis C Antibody  8. Colon cancer screening - Ambulatory referral to Gastroenterology   RTC: 6 months for chronic conditions   [place]  MSN, FNP-C Patient [name] [address] [place], [place] [address] [phone]

## 2017-11-19 ENCOUNTER — Telehealth: Payer: Self-pay

## 2017-11-23 ENCOUNTER — Encounter (HOSPITAL_COMMUNITY): Payer: Self-pay | Admitting: Emergency Medicine

## 2017-11-23 ENCOUNTER — Emergency Department (HOSPITAL_COMMUNITY): Payer: Medicaid Other

## 2017-11-23 ENCOUNTER — Emergency Department (HOSPITAL_COMMUNITY)
Admission: EM | Admit: 2017-11-23 | Discharge: 2017-11-23 | Disposition: A | Discharge status: Home / Self Care | Payer: Medicaid Other | Attending: Emergency Medicine | Admitting: Emergency Medicine

## 2017-11-23 ENCOUNTER — Other Ambulatory Visit: Payer: Self-pay

## 2017-11-23 DIAGNOSIS — I2510 Atherosclerotic heart disease of native coronary artery without angina pectoris: Secondary | ICD-10-CM | POA: Diagnosis not present

## 2017-11-23 DIAGNOSIS — Z87891 Personal history of nicotine dependence: Secondary | ICD-10-CM | POA: Insufficient documentation

## 2017-11-23 DIAGNOSIS — Y929 Unspecified place or not applicable: Secondary | ICD-10-CM | POA: Diagnosis not present

## 2017-11-23 DIAGNOSIS — Z7982 Long term (current) use of aspirin: Secondary | ICD-10-CM | POA: Insufficient documentation

## 2017-11-23 DIAGNOSIS — Y9355 Activity, bike riding: Secondary | ICD-10-CM | POA: Diagnosis not present

## 2017-11-23 DIAGNOSIS — S2249XA Multiple fractures of ribs, unspecified side, initial encounter for closed fracture: Secondary | ICD-10-CM | POA: Insufficient documentation

## 2017-11-23 DIAGNOSIS — E119 Type 2 diabetes mellitus without complications: Secondary | ICD-10-CM | POA: Insufficient documentation

## 2017-11-23 DIAGNOSIS — I10 Essential (primary) hypertension: Secondary | ICD-10-CM | POA: Diagnosis not present

## 2017-11-23 DIAGNOSIS — R0781 Pleurodynia: Secondary | ICD-10-CM | POA: Diagnosis present

## 2017-11-23 DIAGNOSIS — Y999 Unspecified external cause status: Secondary | ICD-10-CM | POA: Diagnosis not present

## 2017-11-23 DIAGNOSIS — Z79899 Other long term (current) drug therapy: Secondary | ICD-10-CM | POA: Insufficient documentation

## 2017-11-23 DIAGNOSIS — S2242XA Multiple fractures of ribs, left side, initial encounter for closed fracture: Secondary | ICD-10-CM

## 2017-11-23 MED ORDER — OXYCODONE-ACETAMINOPHEN 5-325 MG PO TABS: 1.0000 | ORAL_TABLET | Freq: Three times a day (TID) | ORAL | 0 refills | Status: DC | PRN

## 2017-11-23 MED ORDER — OXYCODONE-ACETAMINOPHEN 5-325 MG PO TABS
1.0000 | ORAL_TABLET | Freq: Once | ORAL | Status: AC
  Administered 2017-11-23: 1 via ORAL
  Filled 2017-11-23: qty 1
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
## 2017-11-23 NOTE — Telephone Encounter (Signed)
Left a vm for patient to callback 

## 2017-11-23 NOTE — ED Notes (Signed)
Incentive spirometer instructions given. Teach back performed.   Pt verbalized understanding of discharge instructions and denies any further questions at this time.
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
## 2017-11-23 NOTE — Discharge Instructions (Addendum)
Thank you for allowing me to provide your care today in the emergency department.  Apply ice for 15 to 20 minutes up to 3-4 times a day to areas that are sore.  Take 600 mg of ibuprofen with food or 650 mg of Tylenol every 6 hours for pain control.  He may also take 1 tablet of Percocet every 8 hours for severe pain.  Please note this medication is a narcotic and can be addicting.  He should not work or drive while taking this medication.  Each tablet of Percocet contains 325 mg of Tylenol.  Do not take more than 4000 mg of Tylenol in a 24-hour.  Use the incentive spirometer to prevent pneumonia.  Rib fractures usually heal on their own in 1-3 months. The longer healing period is often associated with a continued cough or other aggravating activities. During the healing period, pain control is very important. Medication is usually given to control pain. Hospitalization or surgery may be needed for more severe injuries, such as those in which multiple ribs are broken or the ribs have moved out of place.  Avoid strenuous activity and any activities or movements that cause pain during the first few days/weeks. Be careful during activities and avoid bumping the injured ribs. Gradually increase activity.   If you continue to have significant pain after the next week that does not seem to be improving, please follow-up with your primary care provider.  Return to the emergency department if you have another fall or injury, becomes significantly short of breath, develop fever, chills, thick cough with thick mucus, or other new concerning symptoms.

## 2017-11-23 NOTE — Telephone Encounter (Signed)
Patient notified

## 2017-11-23 NOTE — Telephone Encounter (Signed)
Left a detailed vm for patient with results

## 2017-11-23 NOTE — Telephone Encounter (Signed)
-----   Message from Azzie Glatter, [place] sent at 11/20/2017 11:07 PM EDT ----- Regarding: "Lab Results" [facility] [place],    Labs from 11/15/2017 are within normal limits.  Please call patient.  Thanks,  Lanelle Bal.

## 2017-11-24 ENCOUNTER — Ambulatory Visit: Payer: Self-pay | Admitting: Family Medicine

## 2017-12-07 ENCOUNTER — Other Ambulatory Visit: Payer: Self-pay | Admitting: Family Medicine

## 2017-12-31 ENCOUNTER — Other Ambulatory Visit: Payer: Self-pay | Admitting: Internal Medicine

## 2018-01-12 ENCOUNTER — Telehealth: Payer: Self-pay | Admitting: Gastroenterology

## 2018-01-12 NOTE — Telephone Encounter (Signed)
ROI fax to [facility] for records

## 2018-01-18 ENCOUNTER — Encounter: Payer: Self-pay | Admitting: Family Medicine

## 2018-01-25 ENCOUNTER — Ambulatory Visit (INDEPENDENT_AMBULATORY_CARE_PROVIDER_SITE_OTHER): Payer: Medicaid Other | Admitting: Family Medicine

## 2018-01-25 ENCOUNTER — Other Ambulatory Visit: Payer: Self-pay

## 2018-01-25 ENCOUNTER — Encounter: Payer: Self-pay | Admitting: Family Medicine

## 2018-01-25 VITALS — BP 120/86 | HR 80 | Temp 97.8°F | Ht 68.0 in | Wt 168.0 lb

## 2018-01-25 DIAGNOSIS — M545 Low back pain, unspecified: Secondary | ICD-10-CM

## 2018-01-25 DIAGNOSIS — M25561 Pain in right knee: Secondary | ICD-10-CM

## 2018-01-25 DIAGNOSIS — M25562 Pain in left knee: Secondary | ICD-10-CM

## 2018-01-25 DIAGNOSIS — Z09 Encounter for follow-up examination after completed treatment for conditions other than malignant neoplasm: Secondary | ICD-10-CM

## 2018-01-25 DIAGNOSIS — I10 Essential (primary) hypertension: Secondary | ICD-10-CM | POA: Diagnosis not present

## 2018-01-25 DIAGNOSIS — E785 Hyperlipidemia, unspecified: Secondary | ICD-10-CM | POA: Diagnosis not present

## 2018-01-25 DIAGNOSIS — G8929 Other chronic pain: Secondary | ICD-10-CM

## 2018-01-25 DIAGNOSIS — E1159 Type 2 diabetes mellitus with other circulatory complications: Secondary | ICD-10-CM | POA: Diagnosis not present

## 2018-01-25 DIAGNOSIS — M542 Cervicalgia: Secondary | ICD-10-CM

## 2018-01-25 LAB — POCT GLYCOSYLATED HEMOGLOBIN (HGB A1C): Hemoglobin A1C: 5.7 % — AB (ref 4.0–5.6)

## 2018-01-25 LAB — POCT URINALYSIS DIP (MANUAL ENTRY)
Bilirubin, UA: NEGATIVE
Blood, UA: NEGATIVE
Glucose, UA: NEGATIVE mg/dL
Ketones, POC UA: NEGATIVE mg/dL
Leukocytes, UA: NEGATIVE
Nitrite, UA: NEGATIVE
Protein Ur, POC: NEGATIVE mg/dL
Spec Grav, UA: <=1.005 — AB (ref 1.010–1.025)
Urobilinogen, UA: 0.2 E.U./dL
pH, UA: 7 (ref 5.0–8.0)

## 2018-01-25 MED ORDER — LISINOPRIL 5 MG PO TABS: 5.0000 mg | ORAL_TABLET | Freq: Every day | ORAL | 2 refills | Status: DC

## 2018-01-25 MED ORDER — CARVEDILOL 12.5 MG PO TABS: 12.5000 mg | ORAL_TABLET | Freq: Two times a day (BID) | ORAL | 1 refills | Status: DC

## 2018-01-25 MED ORDER — METFORMIN HCL ER 500 MG PO TB24: 500.0000 mg | ORAL_TABLET | Freq: Every day | ORAL | 1 refills | Status: DC
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
## 2018-01-26 ENCOUNTER — Other Ambulatory Visit: Payer: Self-pay | Admitting: Internal Medicine

## 2018-01-26 DIAGNOSIS — E785 Hyperlipidemia, unspecified: Secondary | ICD-10-CM

## 2018-01-27 ENCOUNTER — Other Ambulatory Visit: Payer: Self-pay | Admitting: Family Medicine

## 2018-01-27 ENCOUNTER — Telehealth: Payer: Self-pay

## 2018-01-27 DIAGNOSIS — I10 Essential (primary) hypertension: Secondary | ICD-10-CM

## 2018-01-27 DIAGNOSIS — E785 Hyperlipidemia, unspecified: Secondary | ICD-10-CM

## 2018-01-27 MED ORDER — PITAVASTATIN CALCIUM 4 MG PO TABS: 1.0000 | ORAL_TABLET | Freq: Every day | ORAL | 0 refills | Status: DC

## 2018-01-27 MED ORDER — LISINOPRIL 10 MG PO TABS: 10.0000 mg | ORAL_TABLET | Freq: Every day | ORAL | 1 refills | Status: DC

## 2018-01-27 MED ORDER — ASPIRIN 81 MG PO TBEC: 81.0000 mg | DELAYED_RELEASE_TABLET | Freq: Every day | ORAL | 0 refills | Status: DC

## 2018-01-27 NOTE — Telephone Encounter (Signed)
[name],   Please inform patient that Rx for Lisinopril 10 mg to pharmacy today. Let him know that I apologize for the error.    Thank you.

## 2018-01-27 NOTE — Progress Notes (Unsigned)
Rx for Lisinopril 10 mg to pharmacy today.

## 2018-01-27 NOTE — Telephone Encounter (Signed)
Patient states that the Lisinopril is suppose to be 10mg  instead of 5mg .

## 2018-01-28 NOTE — Telephone Encounter (Signed)
Patient notified

## 2018-02-03 ENCOUNTER — Telehealth: Payer: Self-pay | Admitting: Internal Medicine

## 2018-02-03 NOTE — Telephone Encounter (Signed)
Faxed PA for livalo to [place] [facility] @ [phone]

## 2018-02-08 NOTE — Progress Notes (Signed)
Cardiology Office Note   Date:  02/09/2018   ID:  [name], DOB 01/30/1961, MRN [id number]  PCP:  Azzie Glatter, FNP  Cardiologist:  Dr.Hilty   Chief Complaint  Patient presents with  . Follow-up     History of Present Illness: [name] is a 57 y.o. male who presents for ongoing assessment and management of CAD wit multiple stents mot recent stent to the circumflex in 2013,, HTN, Dyslipidemia, and microvascular angina. Other history includes Type II Diabetes. He had lost 30 lbs by adopting a vegetarian diet. He remained on Imdur for angina but wanted to stop the medication. He also wanted to stop statin. This was not discontinued due to multiple stents.   When last seen by Dr. Debara Pickett on 01/04/2017 he was doing well. Continued to exercise regularly, and restrict his diet to vegetarian He complained of peripheral neuropathy and wanted to stop statin.He has stopped Livalo.  He was found to be intolerant to different statins. ASA was decreased to 81 mg daily. He was given Mentax supplements. He has had insurance issues paying for statins.   He is without complaints. His PCP noticed that his EKG revealed sinus bradycardia. They wanted him to be seen by cardiology for evaluation. He has run out of his Chanda Busing, and is awaiting insurance approval.   He works out 4 days a week at [employer], elliptical and treadmill and also does a lot of stretching due to chronic DDD in his back and neck. He denies any pain, fatigue or worsening dyspnea with his work outs.   Past Medical History:  Diagnosis Date  . Arthritis   . Chronic lower back pain   . Coronary artery disease    a. Multiple prior caths/PCI. Cath 2013 with possible spasm of RCA, 70% ISR of mid LCx with subsequent DES to mLCx and prox LCX. b. H/o microvascular angina. c. Recurrent angina 08/2014 - s/p PTCA/DES to prox Cx, PTCA/CBA to OM1.  c. LHC 06/10/15 with patent stents and some ISR in LCX and OM-1 that was not flow limiting --> Rx     . Dyslipidemia    a. Intolerant to many statins except tolerating Livalo.  Marland Kitchen GERD (gastroesophageal reflux disease)   . Hypertension   . Myocardial infarction ([place]) ~ 2010  . S/P angioplasty with stent, DES, to proximal and mid LCX 12/15/11 12/15/2011  . Type II diabetes mellitus ([place])     Past Surgical History:  Procedure Laterality Date  . CARDIAC CATHETERIZATION  06/15/2002   LAD with prox 40% stenosis, norma L main, Cfx with 25% lesion, RCA with long mid 25% stenosis (Dr. Vita Barley)  . CARDIAC CATHETERIZATION  04/01/2010   normal L main, LAD wit mild stenosis, L Cfx with 70% in-stent restenosis, RCA with 70% in-stent restenosis, LVEF >60% (Dr. K. Mali Hilty) - cutting ballon arthrectomy to RCA & Cfx (Dr. Rockne Menghini)  . CARDIAC CATHETERIZATION  08/25/2010   preserved global LV contractility; multivessel CAD, diffuse 90-95% in-stent restenosis in prox placed Cfx stent - cutting balloon arthrectomy in Cfx with multiple dilatations 90-95% to 0% stenosis (Dr. Corky Downs)  . CARDIAC CATHETERIZATION  01/26/2011   PCI & stenting of aggresive in-stent restenosis within previously stented AV groove Cfx with 3.0x60mm Taxus DES (previous stents were Promus) (Dr. Adora Fridge)  . CARDIAC CATHETERIZATION  05/11/2011   preserved LV function, 40% mid LAD stenosis, 30-40% narrowing proximal to stented semgnet of prox Cfx, patent mid RCA stent with smooth  20% narrowing in distal RCA (Dr. Corky Downs)  . CARDIAC CATHETERIZATION  12/15/2011   PCI & stenting of proximal & mid Cfx with DES - 3.0x41mm in proximal, 3.0x32mm in mid (Dr. Adora Fridge)  . CARDIAC CATHETERIZATION N/A 06/10/2015   Procedure: Left Heart Cath and Coronary Angiography;  Surgeon: Jolaine Artist, MD;  Location: [place] [facility];  Service: Cardiovascular;  Laterality: N/A;  . cardiometabolic testing  2/87/8676   good exercise effort, peak VO2 79% predicted with normal VO2 HR curves (mild deconditioning)  . EXCISIONAL HEMORRHOIDECTOMY  1984  .  LEFT HEART CATHETERIZATION WITH CORONARY ANGIOGRAM N/A 05/11/2011   Procedure: LEFT HEART CATHETERIZATION WITH CORONARY ANGIOGRAM;  Surgeon: Troy Sine, MD;  Location: [facility] CATH LAB;  Service: Cardiovascular;  Laterality: N/A;  Possible percutaneous coronary intervention, possible IVUS  . LEFT HEART CATHETERIZATION WITH CORONARY ANGIOGRAM N/A 12/15/2011   Procedure: LEFT HEART CATHETERIZATION WITH CORONARY ANGIOGRAM;  Surgeon: Lorretta Harp, MD;  Location: [facility] CATH LAB;  Service: Cardiovascular;  Laterality: N/A;  . LEFT HEART CATHETERIZATION WITH CORONARY ANGIOGRAM N/A 09/05/2014   Procedure: LEFT HEART CATHETERIZATION WITH CORONARY ANGIOGRAM;  Surgeon: Burnell Blanks, MD;  Location: [facility] CATH LAB;  Service: Cardiovascular;  Laterality: N/A;  . LIPOMA EXCISION     back of the head  . NM MYOCAR PERF WALL MOTION  02/2012   lexiscan myoview; mild perfusion defect in mid inferolateral & basal inferolateral region (infarct/scar); EF 52%, abnormal but ow risk scan  . PERCUTANEOUS CORONARY STENT INTERVENTION (PCI-S)  09/05/2014   Procedure: PERCUTANEOUS CORONARY STENT INTERVENTION (PCI-S);  Surgeon: Burnell Blanks, MD;  Location: [facility] CATH LAB;  Service: Cardiovascular;;     Current Outpatient Medications  Medication Sig Dispense Refill  . aspirin (EQ ASPIRIN ADULT LOW DOSE) 81 MG EC tablet Take 1 tablet (81 mg total) by mouth daily. Swallow whole. 90 tablet 0  . carvedilol (COREG) 12.5 MG tablet Take 1 tablet (12.5 mg total) by mouth 2 (two) times daily with a meal. For high blood pressure 90 tablet 1  . co-enzyme Q-10 30 MG capsule Take 30 mg by mouth 3 (three) times daily.    . fluticasone (FLONASE) 50 MCG/ACT nasal spray Place 1 spray into both nostrils daily. 16 g 0  . gabapentin (NEURONTIN) 100 MG capsule Take 1 capsule (100 mg total) by mouth 3 (three) times daily. For agitation/Neuropathic pain 90 capsule 0  . lisinopril (PRINIVIL,ZESTRIL) 10 MG tablet Take 1 tablet (10 mg total) by  mouth daily. For high blood pressure 90 tablet 1  . metFORMIN (GLUCOPHAGE-XR) 500 MG 24 hr tablet Take 1 tablet (500 mg total) by mouth daily with breakfast. For diabetes management 90 tablet 1  . omega-3 acid ethyl esters (LOVAZA) 1 g capsule Take 1 capsule (1 g total) by mouth 2 (two) times daily. For high cholesterol 60 capsule 0  . oxyCODONE-acetaminophen (PERCOCET/ROXICET) 5-325 MG tablet Take 1 tablet by mouth every 8 (eight) hours as needed for severe pain. 10 tablet 0  . Pitavastatin Calcium (LIVALO) 4 MG TABS Take 1 tablet by mouth every morning.      No current facility-administered medications for this visit.     Allergies:   Bee venom; Shellfish allergy; Statins; and Testosterone cypionate    Social History:  The patient  reports that he quit smoking about 8 years ago. His smoking use included cigarettes. He has a 10.00 pack-year smoking history. He has never used smokeless tobacco. He reports that he drinks  alcohol. He reports that he does not use drugs.   Family History:  The patient's family history includes Cancer in his brother and paternal grandfather; Coronary artery disease in his paternal grandmother; Leukemia in his mother; Prostate cancer in his father.    ROS: All other systems are reviewed and negative. Unless otherwise mentioned in H&P    PHYSICAL EXAM: VS:  BP 122/82   Pulse 64   Ht 5\' 8"  (1.727 m)   Wt 181 lb 9.6 oz (82.4 kg)   BMI 27.61 kg/m  , BMI Body mass index is 27.61 kg/m. GEN: Well nourished, well developed, in no acute distress  HEENT: normal  Neck: no JVD, carotid bruits, or masses Cardiac: RRR; no murmurs, rubs, or gallops,no edema  Respiratory:  clear to auscultation bilaterally, normal work of breathing GI: soft, nontender, nondistended, + BS MS: no deformity or atrophy  Skin: warm and dry, no rash Neuro:  Strength and sensation are intact Psych: euthymic mood, full affect   EKG: Sinus bradycardia rate of 64 bpm. Non-specific T wave  abnormality. Early repolarization.   Recent Labs: 07/17/2017: B Natriuretic Peptide 8.4 10/03/2017: ALT 42; BUN 13; Creatinine, Ser 0.75; Hemoglobin 14.4; Platelets 213; Potassium 4.4; Sodium 139; TSH 1.036    Lipid Panel    Component Value Date/Time   CHOL 144 10/03/2017 0614   CHOL 147 07/28/2017 0854   CHOL 226 (H) 02/15/2014 0837   TRIG 157 (H) 10/03/2017 0614   TRIG 208 (H) 02/15/2014 0837   HDL 51 10/03/2017 0614   HDL 48 07/28/2017 0854   HDL 42 02/15/2014 0837   CHOLHDL 2.8 10/03/2017 0614   VLDL 31 10/03/2017 0614   LDLCALC 62 10/03/2017 0614   LDLCALC 85 07/28/2017 0854   LDLCALC 142 (H) 02/15/2014 0837      Wt Readings from Last 3 Encounters:  02/09/18 181 lb 9.6 oz (82.4 kg)  01/25/18 168 lb (76.2 kg)  11/15/17 178 lb (80.7 kg)      Other studies Reviewed: Cardiac Cath 06/10/2015 Conclusion    Prox Cx to Mid Cx lesion, 40% stenosed. The lesion was previously treated with a stent (unknown type).  1st Mrg lesion, 45% stenosed.  Dist LAD lesion, 35% stenosed.   Assessment:  1) 2 vessel CAD with patent stent in mRCA. Stents in proximal LCX and OM1 with mild to moderate ISE ~40-50%. 2) LVEDP 18   ASSESSMENT AND PLAN:  1. CAD: Multiple stents with most recent cardiac cath in 06/10/2015 which revealed patent stents. He is completely asymptomatic and remains very active. No plans to do ischemic work up unless he becomes symptomatic. I have discussed possible symptoms. He verbalizes understanding.   2. Hyperlipidemia: He is going to be given samples of Livalo. Waiting on insurance approval for Rx refills.   3. Diabetes: He is doing ok with diabetes. Followed by PCP. Continue metformin.   4. Depression: Uses antidepressants prn, and is followed by PCP.    Current medicines are reviewed at length with the patient today.    Labs/ tests ordered today include: None, recent labs in April of 2019. Needs labs in October for lipid follow up.   Phill Myron.  West Pugh, ANP, AACC   02/09/2018 3:07 PM    [facility] [address] [place], [place] [address] Phone: [phone]; Fax: [phone]

## 2018-02-09 ENCOUNTER — Ambulatory Visit: Payer: Medicaid Other | Admitting: Adult Health

## 2018-02-09 ENCOUNTER — Encounter: Payer: Self-pay | Admitting: Adult Health

## 2018-02-09 VITALS — BP 122/82 | HR 64 | Ht 68.0 in | Wt 181.6 lb

## 2018-02-09 DIAGNOSIS — E7800 Pure hypercholesterolemia, unspecified: Secondary | ICD-10-CM

## 2018-02-09 DIAGNOSIS — I10 Essential (primary) hypertension: Secondary | ICD-10-CM

## 2018-02-09 DIAGNOSIS — E1140 Type 2 diabetes mellitus with diabetic neuropathy, unspecified: Secondary | ICD-10-CM | POA: Diagnosis not present

## 2018-02-09 DIAGNOSIS — I2510 Atherosclerotic heart disease of native coronary artery without angina pectoris: Secondary | ICD-10-CM | POA: Diagnosis not present

## 2018-02-09 NOTE — Patient Instructions (Signed)
Medication Instructions:  NO CHANGES- Your physician recommends that you continue on your current medications as directed. Please refer to the Current Medication list given to you today.  If you need a refill on your cardiac medications before your next appointment, please call your pharmacy.  Labwork: HAVE PCP FAX UPCOMING RESULTS TO [phone]   Follow-Up: Your physician wants you to follow-up in: [place].  You should receive a reminder letter in the mail two months in advance. If you do not receive a letter, please call our office IN MAY to schedule your follow-up appointment.   Thank you for choosing [facility] at [facility]!!

## 2018-02-18 ENCOUNTER — Ambulatory Visit (INDEPENDENT_AMBULATORY_CARE_PROVIDER_SITE_OTHER): Payer: Medicaid Other | Admitting: Orthopaedic Surgery

## 2018-02-21 ENCOUNTER — Ambulatory Visit (INDEPENDENT_AMBULATORY_CARE_PROVIDER_SITE_OTHER): Payer: Medicaid Other | Admitting: Orthopaedic Surgery

## 2018-03-01 ENCOUNTER — Encounter: Payer: Self-pay | Admitting: Family Medicine

## 2018-03-01 ENCOUNTER — Ambulatory Visit (INDEPENDENT_AMBULATORY_CARE_PROVIDER_SITE_OTHER): Payer: Medicaid Other | Admitting: Family Medicine

## 2018-03-01 ENCOUNTER — Telehealth: Payer: Self-pay | Admitting: Internal Medicine

## 2018-03-01 VITALS — BP 124/88 | HR 59 | Temp 97.6°F | Ht 68.0 in | Wt 179.0 lb

## 2018-03-01 DIAGNOSIS — E785 Hyperlipidemia, unspecified: Secondary | ICD-10-CM | POA: Diagnosis not present

## 2018-03-01 DIAGNOSIS — Z09 Encounter for follow-up examination after completed treatment for conditions other than malignant neoplasm: Secondary | ICD-10-CM | POA: Diagnosis not present

## 2018-03-01 DIAGNOSIS — I10 Essential (primary) hypertension: Secondary | ICD-10-CM | POA: Diagnosis not present

## 2018-03-01 DIAGNOSIS — B356 Tinea cruris: Secondary | ICD-10-CM

## 2018-03-01 DIAGNOSIS — E1159 Type 2 diabetes mellitus with other circulatory complications: Secondary | ICD-10-CM | POA: Diagnosis not present

## 2018-03-01 LAB — POCT URINALYSIS DIP (MANUAL ENTRY)
Bilirubin, UA: NEGATIVE
Blood, UA: NEGATIVE
Glucose, UA: NEGATIVE mg/dL
Ketones, POC UA: NEGATIVE mg/dL
Leukocytes, UA: NEGATIVE
Nitrite, UA: NEGATIVE
Protein Ur, POC: NEGATIVE mg/dL
Spec Grav, UA: >=1.03 — AB (ref 1.010–1.025)
Urobilinogen, UA: 0.2 E.U./dL
pH, UA: 5.5 (ref 5.0–8.0)

## 2018-03-01 MED ORDER — CARVEDILOL 25 MG PO TABS: 25.0000 mg | ORAL_TABLET | Freq: Two times a day (BID) | ORAL | 1 refills | Status: DC

## 2018-03-01 MED ORDER — CICLOPIROX OLAMINE 0.77 % EX CREA: TOPICAL_CREAM | Freq: Two times a day (BID) | CUTANEOUS | 0 refills | Status: DC

## 2018-03-01 NOTE — Patient Instructions (Signed)
Ciclopirox skin cream or gel What is this medicine? CICLOPIROX (sye kloe PEER ox) is an antifungal medicine. It is used to treat certain kinds of fungal or yeast infections of the skin. This medicine may be used for other purposes; ask your health care provider or pharmacist if you have questions. COMMON BRAND NAME(S): Ciclodan, Loprox What should I tell my health care provider before I take this medicine? They need to know if you have any of these conditions: -large areas of burned or damaged skin -an unusual or allergic reaction to ciclopirox, mineral oil, benzyl alcohol, other medicines, foods, dyes, or preservatives -pregnant or trying to get pregnant -breast-feeding How should I use this medicine? This medicine is for use on the skin only. Follow the directions on the prescription label. Wash your hands before and after use. If treating infections on the hand, only wash hands before use. Apply a thin layer to the affected area and rub in gently. Do not use an airtight bandage to cover the affected area unless your doctor or health care professional tells you to. Do not get this medicine in your eyes. If you do, rinse out with plenty of cool tap water. Use at regular intervals. Do not use your medicine more often than directed. Finish the full course prescribed by your doctor or health care professional even if you think you are better. Do not stop using except on your doctor's advice. Talk to your pediatrician regarding the use of this medicine in children. While this drug may be prescribed for children as young as 16 years of age or older for selected conditions, precautions do apply. Overdosage: If you think you have taken too much of this medicine contact a poison control center or emergency room at once. NOTE: This medicine is only for you. Do not share this medicine with others. What if I miss a dose? If you miss a dose, use it as soon as you can. If it is almost time for your next dose,  use only that dose. Do not use double or take extra doses without advice. What may interact with this medicine? Interactions are not expected. Do not use any other skin products without telling your doctor or health care professional. This list may not describe all possible interactions. Give your health care provider a list of all the medicines, herbs, non-prescription drugs, or dietary supplements you use. Also tell them if you smoke, drink alcohol, or use illegal drugs. Some items may interact with your medicine. What should I watch for while using this medicine? Tell your doctor or health care professional if your symptoms do not improve within 2 to 4 weeks, or of they get worse. Tell your doctor or health care professional if you develop sores or blisters that do not heal properly. If your skin infection returns after you stop using this medicine, contact your doctor or health care professional. If you are using this medicine for jock itch, do not wear underwear that is tight-fitting or made from synthetic fibers such as rayon or nylon. Instead, wear loose-fitting, cotton underwear. Dry the area completely after bathing. If you are using this medicine to treat athlete's foot, carefully dry the feet, especially between the toes, after bathing. Do not wear socks made from wool or synthetic materials such as rayon or nylon. Wear clean cotton socks and change them daily or more if your feet sweat a lot. Try wearing sandals or shoes that are well-ventilated. An absorbent powder such as talcum powder  or an antifungal powder may be applied to the skin to keep it dry. Apply the powder to the affected skin in between applications of this medicine. Tinea versicolor (sun fungus) may cause you to have patches of skin that are lighter or darker than the surrounding skin areas. Treatment with this medicine will not restore normal skin coloring right away. It may take a few months for the skin appearance to  improve. What side effects may I notice from receiving this medicine? Side effects that you should report to your doctor or health care professional as soon as possible: -allergic reactions like skin rash, itching or hives, swelling of the face, lips, or tongue -skin peeling Side effects that usually do not require medical attention (report to your doctor or health care professional if they continue or are bothersome): -mild burning, stinging, itching, swelling, or other signs of skin irritation -redness of skin This list may not describe all possible side effects. Call your doctor for medical advice about side effects. You may report side effects to FDA at 1-800-FDA-1088. Where should I keep my medicine? Keep out of the reach of children. Store between 15 and 30 degrees C (59 and 86 degrees F). Do not freeze. Throw away any unused medicine after the expiration date. NOTE: This sheet is a summary. It may not cover all possible information. If you have questions about this medicine, talk to your doctor, pharmacist, or health care provider.  2018 Elsevier/Gold Standard (2007-09-19 16:50:33)

## 2018-03-01 NOTE — Progress Notes (Signed)
Follow Up  Subjective:    Patient ID: [name], male    DOB: [birth year], 57 y.o.   MRN: [id number]   Chief Complaint  Patient presents with  . Follow-up    1 month on chronic condition     HPI  [name] is a 57 year old man with a past medical history of Diabetes, MI, Hypertension, GERD, Dyslipidemia, CAD, Chronic, and Arthritis. He is here today for follow up.   Current Status: Since his last office visit, he is doing well with no complaints. He denies fevers, chills, fatigue, recent infections, weight loss, and night sweats. He denies visual changes, chest pain, heart palpitations, and falls. He has occasionally headaches and dizziness with position changes. Denies severe headaches, confusion, seizures, double vision, and blurred vision, nausea and vomiting.   He has c/o jock itch for a few weeks.   No chest pain, heart palpitations, cough and shortness of breath reported.   No reports of GI problems such as diarrhea, and constipation. He has no reports of blood in stools, dysuria and hematuria. No depression or anxiety reported.   He denies pain today.   Past Medical History:  Diagnosis Date  . Arthritis   . Chronic lower back pain   . Coronary artery disease    a. Multiple prior caths/PCI. Cath 2013 with possible spasm of RCA, 70% ISR of mid LCx with subsequent DES to mLCx and prox LCX. b. H/o microvascular angina. c. Recurrent angina 08/2014 - s/p PTCA/DES to prox Cx, PTCA/CBA to OM1.  c. LHC 06/10/15 with patent stents and some ISR in LCX and OM-1 that was not flow limiting --> Rx   . Dyslipidemia    a. Intolerant to many statins except tolerating Livalo.  Marland Kitchen GERD (gastroesophageal reflux disease)   . Hypertension   . Myocardial infarction ([facility]) ~ 2010  . S/P angioplasty with stent, DES, to proximal and mid LCX 12/15/11 12/15/2011  . Type II diabetes mellitus ([name])     Family History  Problem Relation Age of Onset  . Leukemia Mother   . Prostate cancer Father   .  Coronary artery disease Paternal Grandmother   . Cancer Paternal Grandfather   . Cancer Brother     Social History   Socioeconomic History  . Marital status: Divorced    Spouse name: Not on file  . Number of children: 2  . Years of education: GED  . Highest education level: Not on file  Occupational History  . Not on file  Social Needs  . Financial resource strain: Not on file  . Food insecurity:    Worry: Not on file    Inability: Not on file  . Transportation needs:    Medical: Not on file    Non-medical: Not on file  Tobacco Use  . Smoking status: Former Smoker    Packs/day: 1.00    Years: 10.00    Pack years: 10.00    Types: Cigarettes    Last attempt to quit: 04/07/2009    Years since quitting: 8.9  . Smokeless tobacco: Never Used  Substance and Sexual Activity  . Alcohol use: Yes  . Drug use: No    Comment: 12/15/11 "last cocaine was 2010"  . Sexual activity: Not Currently  Lifestyle  . Physical activity:    Days per week: Not on file    Minutes per session: Not on file  . Stress: Not on file  Relationships  . Social connections:  Talks on phone: Not on file    Gets together: Not on file    Attends religious service: Not on file    Active member of club or organization: Not on file    Attends meetings of clubs or organizations: Not on file    Relationship status: Not on file  . Intimate partner violence:    Fear of current or ex partner: Not on file    Emotionally abused: Not on file    Physically abused: Not on file    Forced sexual activity: Not on file  Other Topics Concern  . Not on file  Social History Narrative  . Not on file    Past Surgical History:  Procedure Laterality Date  . CARDIAC CATHETERIZATION  06/15/2002   LAD with prox 40% stenosis, norma L main, Cfx with 25% lesion, RCA with long mid 25% stenosis (Dr. Vita Barley)  . CARDIAC CATHETERIZATION  04/01/2010   normal L main, LAD wit mild stenosis, L Cfx with 70% in-stent  restenosis, RCA with 70% in-stent restenosis, LVEF >60% (Dr. K. Mali Hilty) - cutting ballon arthrectomy to RCA & Cfx (Dr. Rockne Menghini)  . CARDIAC CATHETERIZATION  08/25/2010   preserved global LV contractility; multivessel CAD, diffuse 90-95% in-stent restenosis in prox placed Cfx stent - cutting balloon arthrectomy in Cfx with multiple dilatations 90-95% to 0% stenosis (Dr. Corky Downs)  . CARDIAC CATHETERIZATION  01/26/2011   PCI & stenting of aggresive in-stent restenosis within previously stented AV groove Cfx with 3.0x26mm Taxus DES (previous stents were Promus) (Dr. Adora Fridge)  . CARDIAC CATHETERIZATION  05/11/2011   preserved LV function, 40% mid LAD stenosis, 30-40% narrowing proximal to stented semgnet of prox Cfx, patent mid RCA stent with smooth 20% narrowing in distal RCA (Dr. Corky Downs)  . CARDIAC CATHETERIZATION  12/15/2011   PCI & stenting of proximal & mid Cfx with DES - 3.0x39mm in proximal, 3.0x41mm in mid (Dr. Adora Fridge)  . CARDIAC CATHETERIZATION N/A 06/10/2015   Procedure: Left Heart Cath and Coronary Angiography;  Surgeon: Jolaine Artist, MD;  Location: [facility];  Service: Cardiovascular;  Laterality: N/A;  . cardiometabolic testing  7/78/2423   good exercise effort, peak VO2 79% predicted with normal VO2 HR curves (mild deconditioning)  . EXCISIONAL HEMORRHOIDECTOMY  1984  . LEFT HEART CATHETERIZATION WITH CORONARY ANGIOGRAM N/A 05/11/2011   Procedure: LEFT HEART CATHETERIZATION WITH CORONARY ANGIOGRAM;  Surgeon: Troy Sine, MD;  Location: [facility] CATH LAB;  Service: Cardiovascular;  Laterality: N/A;  Possible percutaneous coronary intervention, possible IVUS  . LEFT HEART CATHETERIZATION WITH CORONARY ANGIOGRAM N/A 12/15/2011   Procedure: LEFT HEART CATHETERIZATION WITH CORONARY ANGIOGRAM;  Surgeon: Lorretta Harp, MD;  Location: [facility] CATH LAB;  Service: Cardiovascular;  Laterality: N/A;  . LEFT HEART CATHETERIZATION WITH CORONARY ANGIOGRAM N/A 09/05/2014   Procedure: LEFT  HEART CATHETERIZATION WITH CORONARY ANGIOGRAM;  Surgeon: Burnell Blanks, MD;  Location: [facility] C[facility];  Service: Cardiovascular;  Laterality: N/A;  . LIPOMA EXCISION     back of the head  . NM MYOCAR PERF WALL MOTION  02/2012   lexiscan myoview; mild perfusion defect in mid inferolateral & basal inferolateral region (infarct/scar); EF 52%, abnormal but ow risk scan  . PERCUTANEOUS CORONARY STENT INTERVENTION (PCI-S)  09/05/2014   Procedure: PERCUTANEOUS CORONARY STENT INTERVENTION (PCI-S);  Surgeon: Burnell Blanks, MD;  Location: [facility] CATH LAB;  Service: Cardiovascular;;    Immunization History  Administered Date(s) Administered  . Influenza,inj,Quad PF,6+ Mos  07/28/2017  . Pneumococcal Polysaccharide-23 07/28/2017  . Pneumococcal-Unspecified 06/29/2009    Current Meds  Medication Sig  . aspirin (EQ ASPIRIN ADULT LOW DOSE) 81 MG EC tablet Take 1 tablet (81 mg total) by mouth daily. Swallow whole.  . carvedilol (COREG) 25 MG tablet Take 1 tablet (25 mg total) by mouth 2 (two) times daily with a meal. For high blood pressure  . co-enzyme Q-10 30 MG capsule Take 30 mg by mouth 3 (three) times daily.  . fluticasone (FLONASE) 50 MCG/ACT nasal spray Place 1 spray into both nostrils daily.  Marland Kitchen gabapentin (NEURONTIN) 100 MG capsule Take 1 capsule (100 mg total) by mouth 3 (three) times daily. For agitation/Neuropathic pain  . lisinopril (PRINIVIL,ZESTRIL) 10 MG tablet Take 1 tablet (10 mg total) by mouth daily. For high blood pressure  . metFORMIN (GLUCOPHAGE-XR) 500 MG 24 hr tablet Take 1 tablet (500 mg total) by mouth daily with breakfast. For diabetes management  . omega-3 acid ethyl esters (LOVAZA) 1 g capsule Take 1 capsule (1 g total) by mouth 2 (two) times daily. For high cholesterol  . Pitavastatin Calcium (LIVALO) 4 MG TABS Take 1 tablet by mouth every morning.   . [DISCONTINUED] carvedilol (COREG) 12.5 MG tablet Take 1 tablet (12.5 mg total) by mouth 2 (two) times daily with a  meal. For high blood pressure    Allergies  Allergen Reactions  . Bee Venom Anaphylaxis and Hives  . Shellfish Allergy Anaphylaxis and Hives  . Statins Other (See Comments)    Myalgias. Tolerating livalo.   . Testosterone Cypionate     Testerone Injection --Increased breast tissue     BP 124/88 (BP Location: Left Arm, Patient Position: Sitting, Cuff Size: Small)   Pulse (!) 59   Temp 97.6 F (36.4 C) (Oral)   Ht 5\' 8"  (1.727 m)   Wt 179 lb (81.2 kg)   SpO2 100%   BMI 27.22 kg/m    Review of Systems  Constitutional: Negative.   HENT: Negative.   Eyes: Negative.   Respiratory: Negative.   Cardiovascular: Negative.   Gastrointestinal: Negative.   Endocrine: Negative.   Genitourinary: Negative.   Musculoskeletal: Negative.   Skin: Positive for rash (Jock itch).  Allergic/Immunologic: Negative.   Neurological: Negative.   Hematological: Negative.   Psychiatric/Behavioral: Negative.    Objective:   Physical Exam  Constitutional: He is oriented to person, place, and time.  HENT:  Head: Normocephalic and atraumatic.  Right Ear: External ear normal.  Left Ear: External ear normal.  Nose: Nose normal.  Mouth/Throat: Oropharynx is clear and moist.  Eyes: Pupils are equal, round, and reactive to light. Conjunctivae and EOM are normal.  Neck: Normal range of motion. Neck supple.  Cardiovascular: Normal rate, regular rhythm, normal heart sounds and intact distal pulses.  Pulmonary/Chest: Effort normal and breath sounds normal.  Abdominal: Soft. Bowel sounds are normal.  Musculoskeletal: Normal range of motion.  Neurological: He is alert and oriented to person, place, and time.  Skin: Skin is warm and dry. Capillary refill takes less than 2 seconds.  Jock itch  Psychiatric: He has a normal mood and affect. His behavior is normal. Judgment and thought content normal.  Nursing note and vitals reviewed.  Assessment & Plan:   1. Essential hypertension Blood pressure is  stable at 124/88 today. We continue Coreg 25 mg BID as prescribed. He will continue to decrease high sodium intake, excessive alcohol intake, increase potassium intake, smoking cessation, and increase physical activity of at  least 30 minutes of cardio activity daily. He will continue to follow Heart Healthy or DASH diet. - carvedilol (COREG) 25 MG tablet; Take 1 tablet (25 mg total) by mouth 2 (two) times daily with a meal. For high blood pressure  Dispense: 180 tablet; Refill: 1  2. Hyperlipidemia, unspecified hyperlipidemia type Lipid panel stable. He will continue Lisinopril and Pitavastatin as prescribed.   3. Type 2 diabetes mellitus with other circulatory complication, without long-term current use of insulin ([name]) Improved. Hgb A1c at 5.7 on 01/25/2018, decreased from 6.0 on 10/03/2017. Continue Metformin as prescribed. He will continue to decrease foods/beverages high in sugars and carbs and follow Heart Healthy or DASH diet. Increase physical activity to at least 30 minutes cardio exercise daily.   4. Tinea cruris Jock itch. Rx for Loprox cream to pharmacy today.  - ciclopirox (LOPROX) 0.77 % cream; Apply topically 2 (two) times daily.  Dispense: 15 g; Refill: 0  5. Follow up He will follow up in 3 months.  - POCT urinalysis dipstick  Meds ordered this encounter  Medications  . carvedilol (COREG) 25 MG tablet    Sig: Take 1 tablet (25 mg total) by mouth 2 (two) times daily with a meal. For high blood pressure    Dispense:  180 tablet    Refill:  1  . ciclopirox (LOPROX) 0.77 % cream    Sig: Apply topically 2 (two) times daily.    Dispense:  15 g    Refill:  0    Kathe Becton,  MSN, FNP-C [facility] [address] [place], [place] [address] [phone]

## 2018-03-02 ENCOUNTER — Encounter: Payer: Self-pay | Admitting: Podiatry

## 2018-03-02 ENCOUNTER — Ambulatory Visit: Payer: Medicaid Other | Admitting: Podiatry

## 2018-03-02 VITALS — BP 124/82 | HR 96

## 2018-03-02 DIAGNOSIS — E1142 Type 2 diabetes mellitus with diabetic polyneuropathy: Secondary | ICD-10-CM

## 2018-03-02 NOTE — Progress Notes (Signed)
Complaint:  Visit Type: Patient presents to my office for diabetic foot exam. Complaint: Patient states" he is having no pain today but has previously been treated with injection and orthoses by another podiatrist. Patient has been diagnosed with DM with neuropathy.. The patient presents for preventative foot care services. No changes to ROS.  Patient is diabetic taking gabapentin.  Podiatric Exam: Vascular: dorsalis pedis and posterior tibial pulses are palpable bilateral. Capillary return is immediate. Temperature gradient is WNL. Skin turgor WNL  Sensorium: Normal Semmes Weinstein monofilament test. Normal tactile sensation bilaterally. Nail Exam: Pt has no evidence of bacterial or fungal infection. Ulcer Exam: There is no evidence of ulcer or pre-ulcerative changes or infection. Orthopedic Exam: Muscle tone and strength are WNL. No limitations in general ROM. No crepitus or effusions noted. Foot type and digits show no abnormalities. Bony prominences are unremarkable. Skin: No Porokeratosis. No infection or ulcers  Diagnosis:  Diabetic neuropathy.  Treatment & Plan Procedures and Treatment: Consent by patient was obtained for treatment procedures.  Diabetic foot exam performed. No ulceration, no infection noted. Patient has good vascular and neurologic status. Return Visit-Office Procedure: Patient instructed to return to the office for a follow up visit 1 year for continued evaluation and treatment.    Gardiner Barefoot DPM

## 2018-03-09 ENCOUNTER — Encounter: Payer: Self-pay | Admitting: Internal Medicine

## 2018-03-09 NOTE — Telephone Encounter (Signed)
Pt returned call and was given information

## 2018-03-09 NOTE — Telephone Encounter (Signed)
Records have been sent to scanning

## 2018-03-09 NOTE — Telephone Encounter (Signed)
Left message for patient to return my call. Recall Colon has been entered for 12/2022

## 2018-03-09 NOTE — Telephone Encounter (Signed)
[facility] had colonoscopy 12/2012 Diminutive sigmoid hyperplastic polyp removed  Report rec repeat 3-5 yrs  BUT since sigmoid hyperplastic polyp and no others - and no family hx colon cancer NEXT COLONOSCOPY DUE approx 12/2022 (10 yrs) per current guidelines  We will explain to patient and place a recall 7/24

## 2018-03-28 ENCOUNTER — Ambulatory Visit: Payer: Self-pay | Admitting: Family Medicine

## 2018-03-31 ENCOUNTER — Telehealth: Payer: Self-pay

## 2018-03-31 NOTE — Telephone Encounter (Signed)
Patient states that he will be at appointment at 820am.

## 2018-04-01 ENCOUNTER — Ambulatory Visit (INDEPENDENT_AMBULATORY_CARE_PROVIDER_SITE_OTHER): Payer: Medicaid Other | Admitting: Family Medicine

## 2018-04-01 ENCOUNTER — Encounter: Payer: Self-pay | Admitting: Family Medicine

## 2018-04-01 VITALS — BP 134/96 | HR 80 | Temp 98.0°F | Ht 68.0 in | Wt 172.0 lb

## 2018-04-01 DIAGNOSIS — Z23 Encounter for immunization: Secondary | ICD-10-CM

## 2018-04-01 DIAGNOSIS — R7303 Prediabetes: Secondary | ICD-10-CM

## 2018-04-01 DIAGNOSIS — I10 Essential (primary) hypertension: Secondary | ICD-10-CM

## 2018-04-01 DIAGNOSIS — Z09 Encounter for follow-up examination after completed treatment for conditions other than malignant neoplasm: Secondary | ICD-10-CM

## 2018-04-01 DIAGNOSIS — F419 Anxiety disorder, unspecified: Secondary | ICD-10-CM

## 2018-04-01 LAB — POCT GLYCOSYLATED HEMOGLOBIN (HGB A1C): Hemoglobin A1C: 5.9 % — AB (ref 4.0–5.6)

## 2018-04-01 LAB — POCT URINALYSIS DIP (MANUAL ENTRY)
Bilirubin, UA: NEGATIVE
Blood, UA: NEGATIVE
Glucose, UA: NEGATIVE mg/dL
Ketones, POC UA: NEGATIVE mg/dL
Leukocytes, UA: NEGATIVE
Nitrite, UA: NEGATIVE
Protein Ur, POC: NEGATIVE mg/dL
Spec Grav, UA: 1.01 (ref 1.010–1.025)
Urobilinogen, UA: 0.2 E.U./dL
pH, UA: 5.5 (ref 5.0–8.0)

## 2018-04-01 MED ORDER — AMLODIPINE BESYLATE 10 MG PO TABS: 10.0000 mg | ORAL_TABLET | Freq: Every day | ORAL | 1 refills | Status: DC

## 2018-04-01 NOTE — Patient Instructions (Signed)
Amlodipine tablets What is this medicine? AMLODIPINE (am LOE di peen) is a calcium-channel blocker. It affects the amount of calcium found in your heart and muscle cells. This relaxes your blood vessels, which can reduce the amount of work the heart has to do. This medicine is used to lower high blood pressure. It is also used to prevent chest pain. This medicine may be used for other purposes; ask your health care provider or pharmacist if you have questions. COMMON BRAND NAME(S): Norvasc What should I tell my health care provider before I take this medicine? They need to know if you have any of these conditions: -heart problems like heart failure or aortic stenosis -liver disease -an unusual or allergic reaction to amlodipine, other medicines, foods, dyes, or preservatives -pregnant or trying to get pregnant -breast-feeding How should I use this medicine? Take this medicine by mouth with a glass of water. Follow the directions on the prescription label. Take your medicine at regular intervals. Do not take more medicine than directed. Talk to your pediatrician regarding the use of this medicine in children. Special care may be needed. This medicine has been used in children as young as 6. Persons over 26 years old may have a stronger reaction to this medicine and need smaller doses. Overdosage: If you think you have taken too much of this medicine contact a poison control center or emergency room at once. NOTE: This medicine is only for you. Do not share this medicine with others. What if I miss a dose? If you miss a dose, take it as soon as you can. If it is almost time for your next dose, take only that dose. Do not take double or extra doses. What may interact with this medicine? -herbal or dietary supplements -local or general anesthetics -medicines for high blood pressure -medicines for prostate problems -rifampin This list may not describe all possible interactions. Give your health  care provider a list of all the medicines, herbs, non-prescription drugs, or dietary supplements you use. Also tell them if you smoke, drink alcohol, or use illegal drugs. Some items may interact with your medicine. What should I watch for while using this medicine? Visit your doctor or health care professional for regular check ups. Check your blood pressure and pulse rate regularly. Ask your health care professional what your blood pressure and pulse rate should be, and when you should contact him or her. This medicine may make you feel confused, dizzy or lightheaded. Do not drive, use machinery, or do anything that needs mental alertness until you know how this medicine affects you. To reduce the risk of dizzy or fainting spells, do not sit or stand up quickly, especially if you are an older patient. Avoid alcoholic drinks; they can make you more dizzy. Do not suddenly stop taking amlodipine. Ask your doctor or health care professional how you can gradually reduce the dose. What side effects may I notice from receiving this medicine? Side effects that you should report to your doctor or health care professional as soon as possible: -allergic reactions like skin rash, itching or hives, swelling of the face, lips, or tongue -breathing problems -changes in vision or hearing -chest pain -fast, irregular heartbeat -swelling of legs or ankles Side effects that usually do not require medical attention (report to your doctor or health care professional if they continue or are bothersome): -dry mouth -facial flushing -nausea, vomiting -stomach gas, pain -tired, weak -trouble sleeping This list may not describe all possible side  effects. Call your doctor for medical advice about side effects. You may report side effects to FDA at 1-800-FDA-1088. °Where should I keep my medicine? °Keep out of the reach of children. °Store at room temperature between 59 and 86 degrees F (15 and 30 degrees C). Protect from  light. Keep container tightly closed. Throw away any unused medicine after the expiration date. °NOTE: This sheet is a summary. It may not cover all possible information. If you have questions about this medicine, talk to your doctor, pharmacist, or health care provider. °© 2018 Elsevier/Gold Standard (2012-05-13 11:40:58) ° °

## 2018-04-01 NOTE — Progress Notes (Signed)
Sick Visit  Subjective:    Patient ID: [name], male    DOB: 05/09/1961, 57 y.o.   MRN: [id number]   Chief Complaint  Patient presents with  . Follow-up    HTN    HPI  [name] is a 57 year old male with a past medical history of Diabetes, MI, Hypertension, GERD, Dyslipidemia, CAD, Chronic Lower Back Pain, and Arthritis. He is here today for a Sick Visit.   Current Status: Since his last office visit, he is having significant grief today, because of the loss of his oldest brother yesterday. He anxiety is high and he is tearful. He has had 4 additions deaths in his family over this past year. He states that he continues to increase dosage of Lisinopril on, which has been ineffective, so he would like to discontinue and try another antihypertensive medication. He  denies visual changes, chest pain, cough, shortness of breath, heart palpitations, and falls. He  has occasionally headaches and dizziness with position changes. Denies severe headaches, confusion, seizures, double vision, and blurred vision, nausea and vomiting.  He denies fevers, chills, fatigue, recent infections, weight loss, and night sweats. No reports of GI problems such as diarrhea, and constipation. He has no reports of blood in stools, dysuria and hematuria. No depression or anxiety, and denies suicidal ideations, homicidal ideations, or auditory hallucinations. He denies pain today.   Past Medical History:  Diagnosis Date  . Arthritis   . Chronic lower back pain   . Coronary artery disease    a. Multiple prior caths/PCI. Cath 2013 with possible spasm of RCA, 70% ISR of mid LCx with subsequent DES to mLCx and prox LCX. b. H/o microvascular angina. c. Recurrent angina 08/2014 - s/p PTCA/DES to prox Cx, PTCA/CBA to OM1.  c. LHC 06/10/15 with patent stents and some ISR in LCX and OM-1 that was not flow limiting --> Rx   . Dyslipidemia    a. Intolerant to many statins except tolerating Livalo.  . GERD (gastroesophageal  reflux disease)   . Hypertension   . Myocardial infarction ([name]) ~ 2010  . S/P angioplasty with stent, DES, to proximal and mid LCX 12/15/11 12/15/2011  . Type II diabetes mellitus ([name])     Family History  Problem Relation Age of Onset  . Leukemia Mother   . Prostate cancer Father   . Coronary artery disease Paternal Grandmother   . Cancer Paternal Grandfather   . Cancer Brother     Social History   Socioeconomic History  . Marital status: Divorced    Spouse name: Not on file  . Number of children: 2  . Years of education: GED  . Highest education level: Not on file  Occupational History  . Not on file  Social Needs  . Financial resource strain: Not on file  . Food insecurity:    Worry: Not on file    Inability: Not on file  . Transportation needs:    Medical: Not on file    Non-medical: Not on file  Tobacco Use  . Smoking status: Former Smoker    Packs/day: 1.00    Years: 10.00    Pack years: 10.00    Types: Cigarettes    Last attempt to quit: 04/07/2009    Years since quitting: 8.9  . Smokeless tobacco: Never Used  Substance and Sexual Activity  . Alcohol use: Yes  . Drug use: No    Comment: 12/15/11 "last cocaine was 2010"  . Sexual activity:   Not Currently  Lifestyle  . Physical activity:    Days per week: Not on file    Minutes per session: Not on file  . Stress: Not on file  Relationships  . Social connections:    Talks on phone: Not on file    Gets together: Not on file    Attends religious service: Not on file    Active member of club or organization: Not on file    Attends meetings of clubs or organizations: Not on file    Relationship status: Not on file  . Intimate partner violence:    Fear of current or ex partner: Not on file    Emotionally abused: Not on file    Physically abused: Not on file    Forced sexual activity: Not on file  Other Topics Concern  . Not on file  Social History Narrative  . Not on file    Past Surgical History:   Procedure Laterality Date  . CARDIAC CATHETERIZATION  06/15/2002   LAD with prox 40% stenosis, norma L main, Cfx with 25% lesion, RCA with long mid 25% stenosis (Dr. Vita Barley)  . CARDIAC CATHETERIZATION  04/01/2010   normal L main, LAD wit mild stenosis, L Cfx with 70% in-stent restenosis, RCA with 70% in-stent restenosis, LVEF >60% (Dr. K. Mali Hilty) - cutting ballon arthrectomy to RCA & Cfx (Dr. Rockne Menghini)  . CARDIAC CATHETERIZATION  08/25/2010   preserved global LV contractility; multivessel CAD, diffuse 90-95% in-stent restenosis in prox placed Cfx stent - cutting balloon arthrectomy in Cfx with multiple dilatations 90-95% to 0% stenosis (Dr. Corky Downs)  . CARDIAC CATHETERIZATION  01/26/2011   PCI & stenting of aggresive in-stent restenosis within previously stented AV groove Cfx with 3.0x87m Taxus DES (previous stents were Promus) (Dr. JAdora Fridge  . CARDIAC CATHETERIZATION  05/11/2011   preserved LV function, 40% mid LAD stenosis, 30-40% narrowing proximal to stented semgnet of prox Cfx, patent mid RCA stent with smooth 20% narrowing in distal RCA (Dr. TCorky Downs  . CARDIAC CATHETERIZATION  12/15/2011   PCI & stenting of proximal & mid Cfx with DES - 3.0x19min proximal, 3.0x1565mn mid (Dr. J. Adora Fridge. CARDIAC CATHETERIZATION N/A 06/10/2015   Procedure: Left Heart Cath and Coronary Angiography;  Surgeon: DanJolaine ArtistD;  Location: [facility];  Service: Cardiovascular;  Laterality: N/A;  . cardiometabolic testing  08/11/69/0626good exercise effort, peak VO2 79% predicted with normal VO2 HR curves (mild deconditioning)  . COLONOSCOPY  12/2012   diminutive hyperplastic sigmoid poyp so repeat routine 2024  . EXCISIONAL HEMORRHOIDECTOMY  1984  . LEFT HEART CATHETERIZATION WITH CORONARY ANGIOGRAM N/A 05/11/2011   Procedure: LEFT HEART CATHETERIZATION WITH CORONARY ANGIOGRAM;  Surgeon: ThoTroy SineD;  [facility];  Service: Cardiovascular;  Laterality: N/A;   Possible percutaneous coronary intervention, possible IVUS  . LEFT HEART CATHETERIZATION WITH CORONARY ANGIOGRAM N/A 12/15/2011   Procedure: LEFT HEART CATHETERIZATION WITH CORONARY ANGIOGRAM;  Surgeon: JonLorretta HarpD;  [facility];  Service: Cardiovascular;  Laterality: N/A;  . LEFT HEART CATHETERIZATION WITH CORONARY ANGIOGRAM N/A 09/05/2014   Procedure: LEFT HEART CATHETERIZATION WITH CORONARY ANGIOGRAM;  Surgeon: ChrBurnell BlanksD;  [facility];  Service: Cardiovascular;  Laterality: N/A;  . LIPOMA EXCISION     back of the head  . NM MYOCAR PERF WALL MOTION  02/2012   lexiscan myoview; mild perfusion defect in mid inferolateral &  basal inferolateral region (infarct/scar); EF 52%, abnormal but ow risk scan  . PERCUTANEOUS CORONARY STENT INTERVENTION (PCI-S)  09/05/2014   Procedure: PERCUTANEOUS CORONARY STENT INTERVENTION (PCI-S);  Surgeon: Burnell Blanks, MD;  Location: [facility];  Service: Cardiovascular;;    Social History   Socioeconomic History  . Marital status: Divorced    Spouse name: Not on file  . Number of children: 2  . Years of education: GED  . Highest education level: Not on file  Occupational History  . Not on file  Social Needs  . Financial resource strain: Not on file  . Food insecurity:    Worry: Not on file    Inability: Not on file  . Transportation needs:    Medical: Not on file    Non-medical: Not on file  Tobacco Use  . Smoking status: Former Smoker    Packs/day: 1.00    Years: 10.00    Pack years: 10.00    Types: Cigarettes    Last attempt to quit: 04/07/2009    Years since quitting: 8.9  . Smokeless tobacco: Never Used  Substance and Sexual Activity  . Alcohol use: Yes  . Drug use: No    Comment: 12/15/11 "last cocaine was 2010"  . Sexual activity: Not Currently  Lifestyle  . Physical activity:    Days per week: Not on file    Minutes per session: Not on file  . Stress: Not on file  Relationships  .  Social connections:    Talks on phone: Not on file    Gets together: Not on file    Attends religious service: Not on file    Active member of club or organization: Not on file    Attends meetings of clubs or organizations: Not on file    Relationship status: Not on file  . Intimate partner violence:    Fear of current or ex partner: Not on file    Emotionally abused: Not on file    Physically abused: Not on file    Forced sexual activity: Not on file  Other Topics Concern  . Not on file  Social History Narrative  . Not on file   Past Surgical History:  Procedure Laterality Date  . CARDIAC CATHETERIZATION  06/15/2002   LAD with prox 40% stenosis, norma L main, Cfx with 25% lesion, RCA with long mid 25% stenosis (Dr. Vita Barley)  . CARDIAC CATHETERIZATION  04/01/2010   normal L main, LAD wit mild stenosis, L Cfx with 70% in-stent restenosis, RCA with 70% in-stent restenosis, LVEF >60% (Dr. K. Mali Hilty) - cutting ballon arthrectomy to RCA & Cfx (Dr. Rockne Menghini)  . CARDIAC CATHETERIZATION  08/25/2010   preserved global LV contractility; multivessel CAD, diffuse 90-95% in-stent restenosis in prox placed Cfx stent - cutting balloon arthrectomy in Cfx with multiple dilatations 90-95% to 0% stenosis (Dr. Corky Downs)  . CARDIAC CATHETERIZATION  01/26/2011   PCI & stenting of aggresive in-stent restenosis within previously stented AV groove Cfx with 3.0x63m Taxus DES (previous stents were Promus) (Dr. JAdora Fridge  . CARDIAC CATHETERIZATION  05/11/2011   preserved LV function, 40% mid LAD stenosis, 30-40% narrowing proximal to stented semgnet of prox Cfx, patent mid RCA stent with smooth 20% narrowing in distal RCA (Dr. TCorky Downs  . CARDIAC CATHETERIZATION  12/15/2011   PCI & stenting of proximal & mid Cfx with DES - 3.0x1106min proximal, 3.0x1544mn mid (Dr. J. Adora Fridge. CARDIAC CATHETERIZATION N/A 06/10/2015  Procedure: Left Heart Cath and Coronary Angiography;  Surgeon: Daniel R Bensimhon, MD;   [facility];  Service: Cardiovascular;  Laterality: N/A;  . cardiometabolic testing  08/08/2012   good exercise effort, peak VO2 79% predicted with normal VO2 HR curves (mild deconditioning)  . COLONOSCOPY  12/2012   diminutive hyperplastic sigmoid poyp so repeat routine 2024  . EXCISIONAL HEMORRHOIDECTOMY  1984  . LEFT HEART CATHETERIZATION WITH CORONARY ANGIOGRAM N/A 05/11/2011   Procedure: LEFT HEART CATHETERIZATION WITH CORONARY ANGIOGRAM;  Surgeon: Thomas A Kelly, MD;  [facility];  Service: Cardiovascular;  Laterality: N/A;  Possible percutaneous coronary intervention, possible IVUS  . LEFT HEART CATHETERIZATION WITH CORONARY ANGIOGRAM N/A 12/15/2011   Procedure: LEFT HEART CATHETERIZATION WITH CORONARY ANGIOGRAM;  Surgeon: Jonathan J Berry, MD;  [facility];  Service: Cardiovascular;  Laterality: N/A;  . LEFT HEART CATHETERIZATION WITH CORONARY ANGIOGRAM N/A 09/05/2014   Procedure: LEFT HEART CATHETERIZATION WITH CORONARY ANGIOGRAM;  Surgeon: Christopher D McAlhany, MD;  [facility];  Service: Cardiovascular;  Laterality: N/A;  . LIPOMA EXCISION     back of the head  . NM MYOCAR PERF WALL MOTION  02/2012   lexiscan myoview; mild perfusion defect in mid inferolateral & basal inferolateral region (infarct/scar); EF 52%, abnormal but ow risk scan  . PERCUTANEOUS CORONARY STENT INTERVENTION (PCI-S)  09/05/2014   Procedure: PERCUTANEOUS CORONARY STENT INTERVENTION (PCI-S);  Surgeon: Christopher D McAlhany, MD;  [facility];  Service: Cardiovascular;;    Immunization History  Administered Date(s) Administered  . Influenza,inj,Quad PF,6+ Mos 07/28/2017, 04/01/2018  . Pneumococcal Polysaccharide-23 07/28/2017  . Pneumococcal-Unspecified 06/29/2009    Current Meds  Medication Sig  . ACCU-CHEK AVIVA PLUS test strip USE UP TO 3 TIMES A DAY  . ACCU-CHEK FASTCLIX LANCETS MISC USE UP TO THREE TIMES A DAY  . aspirin (EQ ASPIRIN ADULT LOW DOSE)  81 MG EC tablet Take 1 tablet (81 mg total) by mouth daily. Swallow whole.  . Blood Glucose Monitoring Suppl (ACCU-CHEK AVIVA PLUS) w/Device KIT USE UP TO THREE TIMES A DAY  . carvedilol (COREG) 25 MG tablet Take 1 tablet (25 mg total) by mouth 2 (two) times daily with a meal. For high blood pressure  . co-enzyme Q-10 30 MG capsule Take 30 mg by mouth 3 (three) times daily.  . fluticasone (FLONASE) 50 MCG/ACT nasal spray Place 1 spray into both nostrils daily.  . gabapentin (NEURONTIN) 100 MG capsule Take 1 capsule (100 mg total) by mouth 3 (three) times daily. For agitation/Neuropathic pain  . lisinopril (PRINIVIL,ZESTRIL) 10 MG tablet Take 1 tablet (10 mg total) by mouth daily. For high blood pressure  . metFORMIN (GLUCOPHAGE-XR) 500 MG 24 hr tablet Take 1 tablet (500 mg total) by mouth daily with breakfast. For diabetes management  . omega-3 acid ethyl esters (LOVAZA) 1 g capsule Take 1 capsule (1 g total) by mouth 2 (two) times daily. For high cholesterol  . Pitavastatin Calcium (LIVALO) 4 MG TABS Take 1 tablet by mouth every morning.     Allergies  Allergen Reactions  . Bee Venom Anaphylaxis and Hives  . Shellfish Allergy Anaphylaxis and Hives  . Statins Other (See Comments)    Myalgias. Tolerating livalo.   . Testosterone Cypionate     Testerone Injection --Increased breast tissue     BP (!) 134/96 (BP Location: Left Arm, Patient Position: Sitting, Cuff Size: Small)   Pulse 80   Temp 98 F (36.7 C) (Oral)     Ht 5' 8" (1.727 m)   Wt 172 lb (78 kg)   SpO2 100%   BMI 26.15 kg/m   Review of Systems  Respiratory: Negative.   Cardiovascular: Negative.   Gastrointestinal: Negative.   Genitourinary: Negative.   Musculoskeletal: Positive for arthralgias (generalized).  Skin: Negative.   Neurological: Positive for dizziness (Occasional) and headaches (Occasional).  Psychiatric/Behavioral:       Increased anxiety today.    Objective:   Physical Exam  Constitutional: He is  oriented to person, place, and time. He appears well-developed and well-nourished.  HENT:  Head: Normocephalic and atraumatic.  Neck: Normal range of motion. Neck supple.  Cardiovascular: Normal rate, regular rhythm, normal heart sounds and intact distal pulses.  Pulmonary/Chest: Effort normal and breath sounds normal.  Abdominal: Soft. Bowel sounds are normal.  Musculoskeletal: Normal range of motion.  Neurological: He is alert and oriented to person, place, and time.  Skin: Skin is warm and dry.  Psychiatric: Judgment and thought content normal.  Anxious, Tearful  Nursing note and vitals reviewed.  Assessment & Plan:   1. Essential hypertension Blood pressure is elevated. We will discontinue Lisinopril and initiate Norvasc today.  - amLODipine (NORVASC) 10 MG tablet; Take 1 tablet (10 mg total) by mouth daily.  Dispense: 30 tablet; Refill: 1  2. Anxiety Moderate. We will continue to monitor. Patient is encouraged to call office if his anxiety increases.   3. Prediabetes Hgb A1c is stable at 5.9 today, from 5.7 on 12/2017.  He will continue to decrease foods/beverages high in sugars and carbs and follow Heart Healthy or DASH diet. Increase physical activity to at least 30 minutes cardio exercise daily.  - POCT glycosylated hemoglobin (Hb A1C) - POCT urinalysis dipstick  4. Need for immunization against influenza - Flu Vaccine QUAD 36+ mos IM  5. Follow up He will follow up in 1 month.   Meds ordered this encounter  Medications  . amLODipine (NORVASC) 10 MG tablet    Sig: Take 1 tablet (10 mg total) by mouth daily.    Dispense:  30 tablet    Refill:  1     ,  MSN, FNP-C [facility] [address]  [place], [place] [address] [phone]  

## 2018-05-03 ENCOUNTER — Encounter: Payer: Self-pay | Admitting: Family Medicine

## 2018-05-03 ENCOUNTER — Ambulatory Visit (INDEPENDENT_AMBULATORY_CARE_PROVIDER_SITE_OTHER): Payer: Medicaid Other | Admitting: Family Medicine

## 2018-05-03 VITALS — BP 126/88 | HR 68 | Temp 97.8°F | Ht 68.0 in | Wt 177.0 lb

## 2018-05-03 DIAGNOSIS — R7303 Prediabetes: Secondary | ICD-10-CM | POA: Diagnosis not present

## 2018-05-03 DIAGNOSIS — I10 Essential (primary) hypertension: Secondary | ICD-10-CM

## 2018-05-03 DIAGNOSIS — Z09 Encounter for follow-up examination after completed treatment for conditions other than malignant neoplasm: Secondary | ICD-10-CM

## 2018-05-03 DIAGNOSIS — E7849 Other hyperlipidemia: Secondary | ICD-10-CM

## 2018-05-03 DIAGNOSIS — B356 Tinea cruris: Secondary | ICD-10-CM

## 2018-05-03 DIAGNOSIS — Z1211 Encounter for screening for malignant neoplasm of colon: Secondary | ICD-10-CM

## 2018-05-03 MED ORDER — PITAVASTATIN CALCIUM 4 MG PO TABS: 1.0000 | ORAL_TABLET | ORAL | 1 refills | Status: DC

## 2018-05-03 MED ORDER — CICLOPIROX OLAMINE 0.77 % EX CREA: TOPICAL_CREAM | Freq: Two times a day (BID) | CUTANEOUS | 3 refills | Status: DC

## 2018-05-03 MED ORDER — METFORMIN HCL ER 500 MG PO TB24: 500.0000 mg | ORAL_TABLET | Freq: Every day | ORAL | 1 refills | Status: DC

## 2018-05-03 MED ORDER — AMLODIPINE BESYLATE 10 MG PO TABS: 10.0000 mg | ORAL_TABLET | Freq: Every day | ORAL | 1 refills | Status: DC

## 2018-05-03 MED ORDER — OMEGA-3-ACID ETHYL ESTERS 1 G PO CAPS: 1.0000 g | ORAL_CAPSULE | Freq: Two times a day (BID) | ORAL | 1 refills | Status: DC

## 2018-05-03 MED ORDER — CARVEDILOL 25 MG PO TABS: 25.0000 mg | ORAL_TABLET | Freq: Two times a day (BID) | ORAL | 1 refills | Status: DC

## 2018-05-03 MED ORDER — BLOOD PRESSURE CUFF MISC: 1.0000 [IU] | Freq: Every day | 0 refills | Status: DC | PRN

## 2018-05-03 NOTE — Progress Notes (Signed)
Follow Up  Subjective:    Patient ID: [name], male    DOB: [birth year], 57 y.o.   MRN: [id number]   Chief Complaint  Patient presents with  . Follow-up    1 month on chronic condition/need new bp script   HPI  [name] is a 57 year old male with a past medical history of Diabetes, MI, Hypertension, GERD, Dyslipidemia, CAD, Chronic Low Back Pain, and Arthritis. He is here today for follow up.   Current Status: Since his last office visit, he is doing well with no complaints. He denies visual changes, chest pain, cough, shortness of breath, heart palpitations, and falls. He has occasionally headaches and dizziness with position changes. Denies severe headaches, confusion, seizures, double vision, and blurred vision, nausea and vomiting.  He denies fevers, chills, fatigue, recent infections, weight loss, and night sweats. No chest pain, heart palpitations, cough and shortness of breath reported. No reports of GI problems such as diarrhea, and constipation. He has no reports of blood in stools, dysuria and hematuria. No depression or anxiety reported. He denies pain today.   Review of Systems  Constitutional: Negative.   HENT: Negative.   Respiratory: Negative.   Cardiovascular: Negative.   Gastrointestinal: Negative.   Endocrine: Negative.   Genitourinary: Negative.   Musculoskeletal: Negative.   Skin: Negative.   Neurological: Positive for dizziness (Occasional) and headaches (Occasional).  Psychiatric/Behavioral: Negative.    Objective:   Physical Exam  Constitutional: He is oriented to person, place, and time. He appears well-developed and well-nourished.  HENT:  Head: Normocephalic and atraumatic.  Eyes: Pupils are equal, round, and reactive to light. EOM are normal.  Cardiovascular: Normal rate, regular rhythm, normal heart sounds and intact distal pulses.  Pulmonary/Chest: Effort normal and breath sounds normal.  Abdominal: Soft. Bowel sounds are normal.   Musculoskeletal: Normal range of motion.  Neurological: He is alert and oriented to person, place, and time.  Skin: Skin is warm and dry.  Psychiatric: He has a normal mood and affect. His behavior is normal. Judgment and thought content normal.  Nursing note and vitals reviewed.   Assessment & Plan:   1. Essential hypertension Blood pressure is 126/88 today. He will continue Carvedilol as prescribed. He will continue to decrease high sodium intake, excessive alcohol intake, increase potassium intake, smoking cessation, and increase physical activity of at least 30 minutes of cardio activity daily. He will continue to follow Heart Healthy or DASH diet. - carvedilol (COREG) 25 MG tablet; Take 1 tablet (25 mg total) by mouth 2 (two) times daily with a meal. For high blood pressure  Dispense: 180 tablet; Refill: 1 - amLODipine (NORVASC) 10 MG tablet; Take 1 tablet (10 mg total) by mouth daily.  Dispense: 90 tablet; Refill: 1 - Blood Pressure Monitoring (BLOOD PRESSURE CUFF) MISC; 1 Units by Does not apply route daily as needed.  Dispense: 1 each; Refill: 0  2. Other hyperlipidemia - Pitavastatin Calcium (LIVALO) 4 MG TABS; Take 1 tablet (4 mg total) by mouth every morning.  Dispense: 90 tablet; Refill: 1  3. Prediabetes Hgb A1c is mildly increased at 5.9 today, from 5.7 on 01/25/2018. He will continue to decrease foods/beverages high in sugars and carbs and follow Heart Healthy or DASH diet. Increase physical activity to at least 30 minutes cardio exercise daily.  - metFORMIN (GLUCOPHAGE-XR) 500 MG 24 hr tablet; Take 1 tablet (500 mg total) by mouth daily with breakfast. For diabetes management  Dispense: 90 tablet; Refill: 1  4. Screening for colon cancer We will refer him to [facility].  - Ambulatory referral to Gastroenterology  5. Jock itch Not completely resolved. We will refill Loprox today. Educated on avoidance of moisture in underwear.  - ciclopirox (LOPROX) 0.77 % cream; Apply  topically 2 (two) times daily.  Dispense: 30 g; Refill: 3  6. Follow up We will do prostate exam at next office visit. He will follow up in 3 months.  - POCT urinalysis dipstick - ciclopirox (LOPROX) 0.77 % cream; Apply topically 2 (two) times daily.  Dispense: 30 g; Refill: 3  Meds ordered this encounter  Medications  . carvedilol (COREG) 25 MG tablet    Sig: Take 1 tablet (25 mg total) by mouth 2 (two) times daily with a meal. For high blood pressure    Dispense:  180 tablet    Refill:  1  . metFORMIN (GLUCOPHAGE-XR) 500 MG 24 hr tablet    Sig: Take 1 tablet (500 mg total) by mouth daily with breakfast. For diabetes management    Dispense:  90 tablet    Refill:  1  . omega-3 acid ethyl esters (LOVAZA) 1 g capsule    Sig: Take 1 capsule (1 g total) by mouth 2 (two) times daily. For high cholesterol    Dispense:  180 capsule    Refill:  1  . Pitavastatin Calcium (LIVALO) 4 MG TABS    Sig: Take 1 tablet (4 mg total) by mouth every morning.    Dispense:  90 tablet    Refill:  1  . amLODipine (NORVASC) 10 MG tablet    Sig: Take 1 tablet (10 mg total) by mouth daily.    Dispense:  90 tablet    Refill:  1  . ciclopirox (LOPROX) 0.77 % cream    Sig: Apply topically 2 (two) times daily.    Dispense:  30 g    Refill:  3  . Blood Pressure Monitoring (BLOOD PRESSURE CUFF) MISC    Sig: 1 Units by Does not apply route daily as needed.    Dispense:  1 each    Refill:  0    Kathe Becton,  MSN, FNP-C [facility] [address] [place], [place] [address] [phone]

## 2018-05-31 ENCOUNTER — Ambulatory Visit: Payer: Self-pay | Admitting: Family Medicine

## 2018-06-01 ENCOUNTER — Telehealth: Payer: Self-pay

## 2018-06-01 NOTE — Telephone Encounter (Signed)
Per Wells Guiles at [facility] says that patient no showed appointment for today.

## 2018-06-04 ENCOUNTER — Emergency Department (HOSPITAL_COMMUNITY)
Admission: EM | Admit: 2018-06-04 | Discharge: 2018-06-04 | Disposition: A | Discharge status: Home / Self Care | Payer: Medicaid Other | Attending: Emergency Medicine | Admitting: Emergency Medicine

## 2018-06-04 ENCOUNTER — Encounter (HOSPITAL_COMMUNITY): Payer: Self-pay | Admitting: Emergency Medicine

## 2018-06-04 ENCOUNTER — Emergency Department (HOSPITAL_COMMUNITY): Payer: Medicaid Other

## 2018-06-04 DIAGNOSIS — S0993XA Unspecified injury of face, initial encounter: Secondary | ICD-10-CM | POA: Diagnosis present

## 2018-06-04 DIAGNOSIS — Y999 Unspecified external cause status: Secondary | ICD-10-CM | POA: Insufficient documentation

## 2018-06-04 DIAGNOSIS — Z7984 Long term (current) use of oral hypoglycemic drugs: Secondary | ICD-10-CM | POA: Diagnosis not present

## 2018-06-04 DIAGNOSIS — Z955 Presence of coronary angioplasty implant and graft: Secondary | ICD-10-CM | POA: Diagnosis not present

## 2018-06-04 DIAGNOSIS — S46912A Strain of unspecified muscle, fascia and tendon at shoulder and upper arm level, left arm, initial encounter: Secondary | ICD-10-CM | POA: Diagnosis not present

## 2018-06-04 DIAGNOSIS — I10 Essential (primary) hypertension: Secondary | ICD-10-CM | POA: Diagnosis not present

## 2018-06-04 DIAGNOSIS — S0181XA Laceration without foreign body of other part of head, initial encounter: Secondary | ICD-10-CM

## 2018-06-04 DIAGNOSIS — I252 Old myocardial infarction: Secondary | ICD-10-CM | POA: Diagnosis not present

## 2018-06-04 DIAGNOSIS — I2510 Atherosclerotic heart disease of native coronary artery without angina pectoris: Secondary | ICD-10-CM | POA: Insufficient documentation

## 2018-06-04 DIAGNOSIS — Z7982 Long term (current) use of aspirin: Secondary | ICD-10-CM | POA: Diagnosis not present

## 2018-06-04 DIAGNOSIS — Y9355 Activity, bike riding: Secondary | ICD-10-CM | POA: Insufficient documentation

## 2018-06-04 DIAGNOSIS — E119 Type 2 diabetes mellitus without complications: Secondary | ICD-10-CM | POA: Diagnosis not present

## 2018-06-04 DIAGNOSIS — Z87891 Personal history of nicotine dependence: Secondary | ICD-10-CM | POA: Diagnosis not present

## 2018-06-04 DIAGNOSIS — S01412A Laceration without foreign body of left cheek and temporomandibular area, initial encounter: Secondary | ICD-10-CM | POA: Insufficient documentation

## 2018-06-04 DIAGNOSIS — Y929 Unspecified place or not applicable: Secondary | ICD-10-CM | POA: Insufficient documentation

## 2018-06-04 DIAGNOSIS — S43402A Unspecified sprain of left shoulder joint, initial encounter: Secondary | ICD-10-CM

## 2018-06-04 MED ORDER — HYDROCODONE-ACETAMINOPHEN 5-325 MG PO TABS
1.0000 | ORAL_TABLET | ORAL | Status: AC
  Administered 2018-06-04: 1 via ORAL
  Filled 2018-06-04: qty 1

## 2018-06-04 NOTE — ED Notes (Signed)
ED Provider at bedside. 

## 2018-06-04 NOTE — ED Notes (Signed)
Patient transported to X-ray 

## 2018-06-04 NOTE — ED Notes (Signed)
Patient verbalizes understanding of discharge instructions. Opportunity for questioning and answers were provided. Armband removed by staff, pt discharged from ED ambulatory.   

## 2018-06-04 NOTE — Discharge Instructions (Addendum)
Apply ice to help with the swelling and the pain, follow-up with an orthopedic doctor or primary care doctor if not getting better in the next week  Do not apply any antibiotic ointment to the Dermabond.  Take over the counter medications as needed for pain
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
## 2018-07-15 ENCOUNTER — Encounter: Payer: Self-pay | Admitting: Family Medicine

## 2018-07-15 ENCOUNTER — Ambulatory Visit (INDEPENDENT_AMBULATORY_CARE_PROVIDER_SITE_OTHER): Payer: Medicaid Other | Admitting: Family Medicine

## 2018-07-15 VITALS — BP 114/78 | HR 62 | Temp 97.6°F | Ht 68.0 in | Wt 183.4 lb

## 2018-07-15 DIAGNOSIS — Z09 Encounter for follow-up examination after completed treatment for conditions other than malignant neoplasm: Secondary | ICD-10-CM

## 2018-07-15 DIAGNOSIS — R7303 Prediabetes: Secondary | ICD-10-CM | POA: Diagnosis not present

## 2018-07-15 DIAGNOSIS — T7840XA Allergy, unspecified, initial encounter: Secondary | ICD-10-CM | POA: Diagnosis not present

## 2018-07-15 DIAGNOSIS — B356 Tinea cruris: Secondary | ICD-10-CM

## 2018-07-15 DIAGNOSIS — J069 Acute upper respiratory infection, unspecified: Secondary | ICD-10-CM | POA: Diagnosis not present

## 2018-07-15 LAB — POCT URINALYSIS DIP (MANUAL ENTRY)
Bilirubin, UA: NEGATIVE
Blood, UA: NEGATIVE
Glucose, UA: NEGATIVE mg/dL
Ketones, POC UA: NEGATIVE mg/dL
Leukocytes, UA: NEGATIVE
Nitrite, UA: NEGATIVE
Protein Ur, POC: NEGATIVE mg/dL
Spec Grav, UA: 1.01 (ref 1.010–1.025)
Urobilinogen, UA: 0.2 E.U./dL
pH, UA: 7 (ref 5.0–8.0)

## 2018-07-15 LAB — POCT GLYCOSYLATED HEMOGLOBIN (HGB A1C): Hemoglobin A1C: 5.8 % — AB (ref 4.0–5.6)

## 2018-07-15 MED ORDER — CETIRIZINE HCL 10 MG PO TABS: 10.0000 mg | ORAL_TABLET | Freq: Every day | ORAL | 11 refills | Status: DC

## 2018-07-15 MED ORDER — FLUCONAZOLE 150 MG PO TABS: 150.0000 mg | ORAL_TABLET | Freq: Every day | ORAL | 0 refills | Status: DC

## 2018-07-15 MED ORDER — CICLOPIROX OLAMINE 0.77 % EX CREA: TOPICAL_CREAM | Freq: Two times a day (BID) | CUTANEOUS | 3 refills | Status: DC

## 2018-07-15 MED ORDER — AMOXICILLIN-POT CLAVULANATE 875-125 MG PO TABS: 1.0000 | ORAL_TABLET | Freq: Two times a day (BID) | ORAL | 0 refills | Status: AC

## 2018-07-15 NOTE — Progress Notes (Signed)
Sick Visit  Subjective:    Patient ID: [name], male    DOB: 10/01/1960, 58 y.o.   MRN: [id number]   Chief Complaint  Patient presents with  . Nasal Congestion  . Shoulder Pain  . Recurrent Skin Infections   HPI  [name] is a 58 year old male with a past medical history of Diabetes, MI, Hypertension, GERD, Dyslipidemia, CAD, Chronic Low Back Pain, and Arthritis. He is here today for a sick visit.   Current Status: Since his last office visit, he has c/o nasal congestion X 1 week now. He has taken OTC cold and congestion medication with no relief. He also has c/o reccurent jock itch. He denies fevers, chills, fatigue, recent infections, weight loss, and night sweats. He has not had any headaches, visual changes, dizziness, and falls. No chest pain, heart palpitations, cough and shortness of breath reported. No reports of GI problems such as nausea, vomiting, diarrhea, and constipation. He has no reports of blood in stools, dysuria and hematuria. No depression or anxiety reported.He denies pain today.   Family History  Problem Relation Age of Onset  . Leukemia Mother   . Prostate cancer Father   . Coronary artery disease Paternal Grandmother   . Cancer Paternal Grandfather   . Cancer Brother     Social History   Socioeconomic History  . Marital status: Divorced    Spouse name: Not on file  . Number of children: 2  . Years of education: GED  . Highest education level: Not on file  Occupational History  . Not on file  Social Needs  . Financial resource strain: Not on file  . Food insecurity:    Worry: Not on file    Inability: Not on file  . Transportation needs:    Medical: Not on file    Non-medical: Not on file  Tobacco Use  . Smoking status: Former Smoker    Packs/day: 1.00    Years: 10.00    Pack years: 10.00    Types: Cigarettes    Last attempt to quit: 04/07/2009    Years since quitting: 9.2  . Smokeless tobacco: Never Used  Substance and Sexual Activity   . Alcohol use: Yes  . Drug use: No    Comment: 12/15/11 "last cocaine was 2010"  . Sexual activity: Not Currently  Lifestyle  . Physical activity:    Days per week: Not on file    Minutes per session: Not on file  . Stress: Not on file  Relationships  . Social connections:    Talks on phone: Not on file    Gets together: Not on file    Attends religious service: Not on file    Active member of club or organization: Not on file    Attends meetings of clubs or organizations: Not on file    Relationship status: Not on file  . Intimate partner violence:    Fear of current or ex partner: Not on file    Emotionally abused: Not on file    Physically abused: Not on file    Forced sexual activity: Not on file  Other Topics Concern  . Not on file  Social History Narrative  . Not on file    Allergies  Allergen Reactions  . Bee Venom Anaphylaxis and Hives  . Shellfish Allergy Anaphylaxis and Hives  . Statins Other (See Comments)    Myalgias. Tolerating livalo.   . Testosterone Cypionate     Testerone  Injection --Increased breast tissue     Review of Systems  Constitutional: Negative.   HENT: Negative.   Eyes: Negative.   Respiratory: Negative.   Cardiovascular: Negative.   Gastrointestinal: Negative.   Endocrine: Negative.   Genitourinary: Negative.   Musculoskeletal: Negative.   Skin: Positive for rash (jock itch).  Allergic/Immunologic: Negative.   Neurological: Negative.   Hematological: Negative.   Psychiatric/Behavioral: Negative.    Objective:   Physical Exam Vitals signs and nursing note reviewed.  Constitutional:      Appearance: Normal appearance. He is normal weight.  HENT:     Head: Normocephalic and atraumatic.     Right Ear: External ear normal.     Left Ear: External ear normal.     Nose: Nose normal.     Mouth/Throat:     Mouth: Mucous membranes are moist.     Pharynx: Oropharynx is clear.  Eyes:     Conjunctiva/sclera: Conjunctivae normal.   Neck:     Musculoskeletal: Normal range of motion and neck supple.  Cardiovascular:     Rate and Rhythm: Normal rate and regular rhythm.     Pulses: Normal pulses.     Heart sounds: Normal heart sounds.  Pulmonary:     Effort: Pulmonary effort is normal.     Breath sounds: Normal breath sounds.  Abdominal:     General: Abdomen is flat. Bowel sounds are normal.     Palpations: Abdomen is soft.  Musculoskeletal: Normal range of motion.  Skin:    General: Skin is warm and dry.     Capillary Refill: Capillary refill takes less than 2 seconds.  Neurological:     General: No focal deficit present.     Mental Status: He is alert and oriented to person, place, and time.  Psychiatric:        Mood and Affect: Mood normal.        Behavior: Behavior normal.        Thought Content: Thought content normal.        Judgment: Judgment normal.    Assessment & Plan:   1. Prediabetes Hgb A1c is stable at 5.8 today. Continue Metformin as prescribed. He will continue to decrease foods/beverages high in sugars and carbs and follow Heart Healthy or DASH diet. Increase physical activity to at least 30 minutes cardio exercise daily.  - POCT glycosylated hemoglobin (Hb A1C) - POCT urinalysis dipstick  2. Upper respiratory tract infection, unspecified type We will initiate Augmentin today.  3. Allergic state, initial encounter - cetirizine (ZYRTEC) 10 MG tablet; Take 1 tablet (10 mg total) by mouth daily.  Dispense: 30 tablet; Refill: 11  4. Tinea cruris We will initiate Diflucan today.  - fluconazole (DIFLUCAN) 150 MG tablet; Take 1 tablet (150 mg total) by mouth daily.  Dispense: 3 tablet; Refill: 0 - ciclopirox (LOPROX) 0.77 % cream; Apply topically 2 (two) times daily.  Dispense: 30 g; Refill: 3  5. Jock itch We will refill Ciclopirox cream today. - ciclopirox (LOPROX) 0.77 % cream; Apply topically 2 (two) times daily.  Dispense: 30 g; Refill: 3  6. Follow up He will follow up.   Meds  ordered this encounter  Medications  . cetirizine (ZYRTEC) 10 MG tablet    Sig: Take 1 tablet (10 mg total) by mouth daily.    Dispense:  30 tablet    Refill:  11  . amoxicillin-clavulanate (AUGMENTIN) 875-125 MG tablet    Sig: Take 1 tablet by mouth 2 (two) times  daily for 7 days.    Dispense:  14 tablet    Refill:  0  . fluconazole (DIFLUCAN) 150 MG tablet    Sig: Take 1 tablet (150 mg total) by mouth daily.    Dispense:  3 tablet    Refill:  0  . ciclopirox (LOPROX) 0.77 % cream    Sig: Apply topically 2 (two) times daily.    Dispense:  30 g    Refill:  [place],  MSN, FNP-C [facility] [address] [place], [place] [address] [phone]

## 2018-07-15 NOTE — Patient Instructions (Signed)
Cetirizine tablets What is this medicine? CETIRIZINE (se TI ra zeen) is an antihistamine. This medicine is used to treat or prevent symptoms of allergies. It is also used to help reduce itchy skin rash and hives. This medicine may be used for other purposes; ask your health care provider or pharmacist if you have questions. COMMON BRAND NAME(S): All Day Allergy, Allergy Relief, Zyrtec, Zyrtec Hives Relief What should I tell my health care provider before I take this medicine? They need to know if you have any of these conditions: -kidney disease -liver disease -an unusual or allergic reaction to cetirizine, hydroxyzine, other medicines, foods, dyes, or preservatives -pregnant or trying to get pregnant -breast-feeding How should I use this medicine? Take this medicine by mouth with a glass of water. Follow the directions on the prescription label. You can take this medicine with food or on an empty stomach. Take your medicine at regular times. Do not take more often than directed. You may need to take this medicine for several days before your symptoms improve. Talk to your pediatrician regarding the use of this medicine in children. Special care may be needed. While this drug may be prescribed for children as young as 49 years of age for selected conditions, precautions do apply. Overdosage: If you think you have taken too much of this medicine contact a poison control center or emergency room at once. NOTE: This medicine is only for you. Do not share this medicine with others. What if I miss a dose? If you miss a dose, take it as soon as you can. If it is almost time for your next dose, take only that dose. Do not take double or extra doses. What may interact with this medicine? -alcohol -certain medicines for anxiety or sleep -narcotic medicines for pain -other medicines for colds or allergies This list may not describe all possible interactions. Give your health care provider a list of all  the medicines, herbs, non-prescription drugs, or dietary supplements you use. Also tell them if you smoke, drink alcohol, or use illegal drugs. Some items may interact with your medicine. What should I watch for while using this medicine? Visit your doctor or health care professional for regular checks on your health. Tell your doctor if your symptoms do not improve. You may get drowsy or dizzy. Do not drive, use machinery, or do anything that needs mental alertness until you know how this medicine affects you. Do not stand or sit up quickly, especially if you are an older patient. This reduces the risk of dizzy or fainting spells. Your mouth may get dry. Chewing sugarless gum or sucking hard candy, and drinking plenty of water may help. Contact your doctor if the problem does not go away or is severe. What side effects may I notice from receiving this medicine? Side effects that you should report to your doctor or health care professional as soon as possible: -allergic reactions like skin rash, itching or hives, swelling of the face, lips, or tongue -changes in vision or hearing -fast or irregular heartbeat -trouble passing urine or change in the amount of urine Side effects that usually do not require medical attention (report to your doctor or health care professional if they continue or are bothersome): -dizziness -dry mouth -irritability -sore throat -stomach pain -tiredness This list may not describe all possible side effects. Call your doctor for medical advice about side effects. You may report side effects to FDA at 1-800-FDA-1088. Where should I keep my medicine? Keep  out of the reach of children. Store at room temperature between 15 and 30 degrees C (59 and 86 degrees F). Throw away any unused medicine after the expiration date. NOTE: This sheet is a summary. It may not cover all possible information. If you have questions about this medicine, talk to your doctor, pharmacist, or  health care provider.  2019 Elsevier/Gold Standard (2014-07-10 13:44:42) Allergies, Adult An allergy means that your body reacts to something that bothers it (allergen). It is not a normal reaction. This can happen from something that you:  Eat.  Breathe in.  Touch. You can have an allergy (be allergic) to:  Outdoor things, like: ? Pollen. ? Grass. ? Weeds.  Indoor things, like: ? Dust. ? Smoke. ? Pet dander.  Foods.  Medicines.  Things that bother your skin, like: ? Detergents. ? Chemicals. ? Latex.  Perfume.  Bugs. An allergy cannot spread from person to person (is not contagious). Follow these instructions at home:         Stay away from things that you know you are allergic to.  If you have allergies to things in the air, wash out your nose each day. Do it with one of these: ? A salt-water (saline) spray. ? A container (neti pot).  Take over-the-counter and prescription medicines only as told by your doctor.  Keep all follow-up visits as told by your doctor. This is important.  If you are at risk for a very bad allergy reaction (anaphylaxis), keep an auto-injector with you all the time. This is called an epinephrine injection. ? This is pre-measured medicine with a needle. You can put it into your skin by yourself. ? Right after you have a very bad allergy reaction, you or a person with you must give the medicine in less than a few minutes. This is an emergency.  If you have ever had a very bad allergy reaction, wear a medical alert bracelet or necklace. Your very bad allergy should be written on it. Contact a health care provider if:  Your symptoms do not get better with treatment. Get help right away if:  You have symptoms of a very bad allergy reaction. These include: ? A swollen mouth, tongue, or throat. ? Pain or tightness in your chest. ? Trouble breathing. ? Being short of breath. ? Dizziness. ? Fainting. ? Very bad pain in your belly  (abdomen). ? Throwing up (vomiting). ? Watery poop (diarrhea). Summary  An allergy means that your body reacts to something that bothers it (allergen). It is not a normal reaction.  Stay away from things that make your body react.  Take over-the-counter and prescription medicines only as told by your doctor.  If you are at risk for a very bad allergy reaction, carry an auto-injector (epinephrine injection) all the time. Also, wear a medical alert bracelet or necklace so people know about your allergy. This information is not intended to replace advice given to you by your health care provider. Make sure you discuss any questions you have with your health care provider. Document Released: 10/10/2012 Document Revised: 09/28/2016 Document Reviewed: 09/28/2016 Elsevier Interactive Patient Education  2019 [employer] Fluconazole tablets What is this medicine? FLUCONAZOLE (floo KON na zole) is an antifungal medicine. It is used to treat certain kinds of fungal or yeast infections. This medicine may be used for other purposes; ask your health care provider or pharmacist if you have questions. COMMON BRAND NAME(S): Diflucan What should I tell my health care provider  before I take this medicine? They need to know if you have any of these conditions: -history of irregular heart beat -kidney disease -an unusual or allergic reaction to fluconazole, other azole antifungals, medicines, foods, dyes, or preservatives -pregnant or trying to get pregnant -breast-feeding How should I use this medicine? Take this medicine by mouth. Follow the directions on the prescription label. Do not take your medicine more often than directed. Talk to your pediatrician regarding the use of this medicine in children. Special care may be needed. This medicine has been used in children as young as 30 months of age. Overdosage: If you think you have taken too much of this medicine contact a poison control center or  emergency room at once. NOTE: This medicine is only for you. Do not share this medicine with others. What if I miss a dose? If you miss a dose, take it as soon as you can. If it is almost time for your next dose, take only that dose. Do not take double or extra doses. What may interact with this medicine? Do not take this medicine with any of the following medications: -astemizole -certain medicines for irregular heart beat like dofetilide, dronedarone, quinidine -cisapride -erythromycin -lomitapide -other medicines that prolong the QT interval (cause an abnormal heart rhythm) -pimozide -terfenadine -thioridazine -tolvaptan -ziprasidone This medicine may also interact with the following medications: -antiviral medicines for HIV or AIDS -birth control pills -certain antibiotics like rifabutin, rifampin -certain medicines for blood pressure like amlodipine, isradipine, felodipine, hydrochlorothiazide, losartan, nifedipine -certain medicines for cancer like cyclophosphamide, vinblastine, vincristine -certain medicines for cholesterol like atorvastatin, lovastatin, fluvastatin, simvastatin -certain medicines for depression, anxiety, or psychotic disturbances like amitriptyline, midazolam, nortriptyline, triazolam -certain medicines for diabetes like glipizide, glyburide, tolbutamide -certain medicines for pain like alfentanil, fentanyl, methadone -certain medicines for seizures like carbamazepine, phenytoin -certain medicines that treat or prevent blood clots like warfarin -halofantrine -medicines that lower your chance of fighting infection like cyclosporine, prednisone, tacrolimus -NSAIDS, medicines for pain and inflammation, like celecoxib, diclofenac, flurbiprofen, ibuprofen, meloxicam, naproxen -other medicines for fungal infections -sirolimus -theophylline -tofacitinib This list may not describe all possible interactions. Give your health care provider a list of all the  medicines, herbs, non-prescription drugs, or dietary supplements you use. Also tell them if you smoke, drink alcohol, or use illegal drugs. Some items may interact with your medicine. What should I watch for while using this medicine? Visit your doctor or health care professional for regular checkups. If you are taking this medicine for a long time you may need blood work. Tell your doctor if your symptoms do not improve. Some fungal infections need many weeks or months of treatment to cure. Alcohol can increase possible damage to your liver. Avoid alcoholic drinks. If you have a vaginal infection, do not have sex until you have finished your treatment. You can wear a sanitary napkin. Do not use tampons. Wear freshly washed cotton, not synthetic, panties. What side effects may I notice from receiving this medicine? Side effects that you should report to your doctor or health care professional as soon as possible: -allergic reactions like skin rash or itching, hives, swelling of the lips, mouth, tongue, or throat -dark urine -feeling dizzy or faint -irregular heartbeat or chest pain -redness, blistering, peeling or loosening of the skin, including inside the mouth -trouble breathing -unusual bruising or bleeding -vomiting -yellowing of the eyes or skin Side effects that usually do not require medical attention (report to your doctor or health care professional  if they continue or are bothersome): -changes in how food tastes -diarrhea -headache -stomach upset or nausea This list may not describe all possible side effects. Call your doctor for medical advice about side effects. You may report side effects to FDA at 1-800-FDA-1088. Where should I keep my medicine? Keep out of the reach of children. Store at room temperature below 30 degrees C (86 degrees F). Throw away any medicine after the expiration date. NOTE: This sheet is a summary. It may not cover all possible information. If you have  questions about this medicine, talk to your doctor, pharmacist, or health care provider.  2019 Elsevier/Gold Standard (2013-01-21 19:37:38) Jock Itch  Jock itch is an itchy rash in the groin and upper thigh area. It is a skin infection that is caused by a type of germ that lives in dark, damp places (fungus). The rash usually goes away in 2-3 weeks with treatment. Follow these instructions at home: Skin care  Use skin creams, ointments, or powders exactly as told by your doctor.  Wear loose-fitting clothes. Clothes should not rub against your groin area. Men should wear boxer shorts or loose-fitting underwear.  Keep your groin area clean and dry. ? Change your underwear every day. ? Change out of wet bathing suits as soon as you can. ? After bathing, use a separate towel to dry your groin area. Dry the area gently and completely.  Avoid hot baths and showers. Hot water can make itching worse.  Do not scratch the area. General instructions  Take and apply over-the-counter and prescription medicines only as told by your doctor.  Do not share towels or clothing with other people.  Wash your hands often with soap and water, especially after touching your groin area. If you do not have soap and water, use alcohol-based hand sanitizer. Contact a doctor if:  Your rash: ? Gets worse. ? Does not get better after 2 weeks of treatment. ? Spreads. ? Comes back after treatment is done.  You have any of the following: ? A fever. ? New or worsening redness, swelling, or pain around your rash. ? Fluid, blood, or pus coming from your rash. Summary  Jock itch is an itchy rash. It affects the groin and upper thigh area.  Jock itch usually goes away in 2-3 weeks with treatment.  Keep your groin area clean and dry. This information is not intended to replace advice given to you by your health care provider. Make sure you discuss any questions you have with your health care  provider. Document Released: 09/09/2009 Document Revised: 05/26/2017 Document Reviewed: 05/26/2017 Elsevier Interactive Patient Education  2019 [place]. Upper Respiratory Infection, Adult An upper respiratory infection (URI) affects the nose, throat, and upper air passages. URIs are caused by germs (viruses). The most common type of URI is often called "the common cold." Medicines cannot cure URIs, but you can do things at home to relieve your symptoms. URIs usually get better within 7-10 days. Follow these instructions at home: Activity  Rest as needed.  If you have a fever, stay home from work or school until your fever is gone, or until your doctor says you may return to work or school. ? You should stay home until you cannot spread the infection anymore (you are not contagious). ? Your doctor may have you wear a face mask so you have less risk of spreading the infection. Relieving symptoms  Gargle with a salt-water mixture 3-4 times a day or as needed.  To make a salt-water mixture, completely dissolve -1 tsp of salt in 1 cup of warm water.  Use a cool-mist humidifier to add moisture to the air. This can help you breathe more easily. Eating and drinking   Drink enough fluid to keep your pee (urine) pale yellow.  Eat soups and other clear broths. General instructions   Take over-the-counter and prescription medicines only as told by your doctor. These include cold medicines, fever reducers, and cough suppressants.  Do not use any products that contain nicotine or tobacco. These include cigarettes and e-cigarettes. If you need help quitting, ask your doctor.  Avoid being where people are smoking (avoid secondhand smoke).  Make sure you get regular shots and get the flu shot every year.  Keep all follow-up visits as told by your doctor. This is important. How to avoid spreading infection to others   Wash your hands often with soap and water. If you do not have soap and  water, use hand sanitizer.  Avoid touching your mouth, face, eyes, or nose.  Cough or sneeze into a tissue or your sleeve or elbow. Do not cough or sneeze into your hand or into the air. Contact a doctor if:  You are getting worse, not better.  You have any of these: ? A fever. ? Chills. ? Brown or red mucus in your nose. ? Yellow or brown fluid (discharge)coming from your nose. ? Pain in your face, especially when you bend forward. ? Swollen neck glands. ? Pain with swallowing. ? White areas in the back of your throat. Get help right away if:  You have shortness of breath that gets worse.  You have very bad or constant: ? Headache. ? Ear pain. ? Pain in your forehead, behind your eyes, and over your cheekbones (sinus pain). ? Chest pain.  You have long-lasting (chronic) lung disease along with any of these: ? Wheezing. ? Long-lasting cough. ? Coughing up blood. ? A change in your usual mucus.  You have a stiff neck.  You have changes in your: ? Vision. ? Hearing. ? Thinking. ? Mood. Summary  An upper respiratory infection (URI) is caused by a germ called a virus. The most common type of URI is often called "the common cold."  URIs usually get better within 7-10 days.  Take over-the-counter and prescription medicines only as told by your doctor. This information is not intended to replace advice given to you by your health care provider. Make sure you discuss any questions you have with your health care provider. Document Released: 12/02/2007 Document Revised: 02/05/2017 Document Reviewed: 02/05/2017 Elsevier Interactive Patient Education  2019 [employer] Inc. Amoxicillin; Clavulanic Acid tablets What is this medicine? AMOXICILLIN; CLAVULANIC ACID (a mox i SIL in; KLAV yoo lan ic AS id) is a penicillin antibiotic. It is used to treat certain kinds of bacterial infections. It will not work for colds, flu, or other viral infections. This medicine may be used for  other purposes; ask your health care provider or pharmacist if you have questions. COMMON BRAND NAME(S): Augmentin What should I tell my health care provider before I take this medicine? They need to know if you have any of these conditions: -bowel disease, like colitis -kidney disease -liver disease -mononucleosis -an unusual or allergic reaction to amoxicillin, penicillin, cephalosporin, other antibiotics, clavulanic acid, other medicines, foods, dyes, or preservatives -pregnant or trying to get pregnant -breast-feeding How should I use this medicine? Take this medicine by mouth with a full glass of  water. Follow the directions on the prescription label. Take at the start of a meal. Do not crush or chew. If the tablet has a score line, you may cut it in half at the score line for easier swallowing. Take your medicine at regular intervals. Do not take your medicine more often than directed. Take all of your medicine as directed even if you think you are better. Do not skip doses or stop your medicine early. Talk to your pediatrician regarding the use of this medicine in children. Special care may be needed. Overdosage: If you think you have taken too much of this medicine contact a poison control center or emergency room at once. NOTE: This medicine is only for you. Do not share this medicine with others. What if I miss a dose? If you miss a dose, take it as soon as you can. If it is almost time for your next dose, take only that dose. Do not take double or extra doses. What may interact with this medicine? -allopurinol -anticoagulants -birth control pills -methotrexate -probenecid This list may not describe all possible interactions. Give your health care provider a list of all the medicines, herbs, non-prescription drugs, or dietary supplements you use. Also tell them if you smoke, drink alcohol, or use illegal drugs. Some items may interact with your medicine. What should I watch for  while using this medicine? Tell your doctor or health care professional if your symptoms do not improve. Do not treat diarrhea with over the counter products. Contact your doctor if you have diarrhea that lasts more than 2 days or if it is severe and watery. If you have diabetes, you may get a false-positive result for sugar in your urine. Check with your doctor or health care professional. Birth control pills may not work properly while you are taking this medicine. Talk to your doctor about using an extra method of birth control. What side effects may I notice from receiving this medicine? Side effects that you should report to your doctor or health care professional as soon as possible: -allergic reactions like skin rash, itching or hives, swelling of the face, lips, or tongue -breathing problems -dark urine -fever or chills, sore throat -redness, blistering, peeling or loosening of the skin, including inside the mouth -seizures -trouble passing urine or change in the amount of urine -unusual bleeding, bruising -unusually weak or tired -white patches or sores in the mouth or throat Side effects that usually do not require medical attention (report to your doctor or health care professional if they continue or are bothersome): -diarrhea -dizziness -headache -nausea, vomiting -stomach upset -vaginal or anal irritation This list may not describe all possible side effects. Call your doctor for medical advice about side effects. You may report side effects to FDA at 1-800-FDA-1088. Where should I keep my medicine? Keep out of the reach of children. Store at room temperature below 25 degrees C (77 degrees F). Keep container tightly closed. Throw away any unused medicine after the expiration date. NOTE: This sheet is a summary. It may not cover all possible information. If you have questions about this medicine, talk to your doctor, pharmacist, or health care provider.  2019 Elsevier/Gold  Standard (2007-09-08 12:04:30)

## 2018-07-26 ENCOUNTER — Encounter: Payer: Self-pay | Admitting: Family Medicine

## 2018-07-26 ENCOUNTER — Ambulatory Visit (INDEPENDENT_AMBULATORY_CARE_PROVIDER_SITE_OTHER): Payer: Medicaid Other | Admitting: Family Medicine

## 2018-07-26 VITALS — BP 126/82 | HR 64 | Temp 97.7°F | Ht 68.0 in | Wt 185.0 lb

## 2018-07-26 DIAGNOSIS — E7849 Other hyperlipidemia: Secondary | ICD-10-CM

## 2018-07-26 DIAGNOSIS — M25512 Pain in left shoulder: Secondary | ICD-10-CM

## 2018-07-26 DIAGNOSIS — Z09 Encounter for follow-up examination after completed treatment for conditions other than malignant neoplasm: Secondary | ICD-10-CM | POA: Diagnosis not present

## 2018-07-26 DIAGNOSIS — I10 Essential (primary) hypertension: Secondary | ICD-10-CM

## 2018-07-26 MED ORDER — IBUPROFEN 800 MG PO TABS: 800.0000 mg | ORAL_TABLET | Freq: Three times a day (TID) | ORAL | 3 refills | Status: DC | PRN

## 2018-07-26 NOTE — Patient Instructions (Signed)
Muscle Strain A muscle strain is an injury that happens when a muscle is stretched longer than normal. This can happen during a fall, sports, or lifting. This can tear some muscle fibers. Usually, recovery from muscle strain takes 1-2 weeks. Complete healing normally takes 5-6 weeks. This condition is first treated with PRICE therapy. This involves:  Protecting your muscle from being injured again.  Resting your injured muscle.  Icing your injured muscle.  Applying pressure (compression) to your injured muscle. This may be done with a splint or elastic bandage.  Raising (elevating) your injured muscle. Your doctor may also recommend medicine for pain. Follow these instructions at home: If you have a splint:  Wear the splint as told by your doctor. Take it off only as told by your doctor.  Loosen the splint if your fingers or toes tingle, get numb, or turn cold and blue.  Keep the splint clean.  If the splint is not waterproof: ? Do not let it get wet. ? Cover it with a watertight covering when you take a bath or a shower. Managing pain, stiffness, and swelling   If directed, put ice on your injured area. ? If you have a removable splint, take it off as told by your doctor. ? Put ice in a plastic bag. ? Place a towel between your skin and the bag. ? Leave the ice on for 20 minutes, 2-3 times a day.  Move your fingers or toes often. This helps to avoid stiffness and lessen swelling.  Raise your injured area above the level of your heart while you are sitting or lying down.  Wear an elastic bandage as told by your doctor. Make sure it is not too tight. General instructions  Take over-the-counter and prescription medicines only as told by your doctor.  Limit your activity. Rest your injured muscle as told by your doctor. Your doctor may say that gentle movements are okay.  If physical therapy was prescribed, do exercises as told by your doctor.  Do not put pressure on any  part of the splint until it is fully hardened. This may take many hours.  Do not use any products that contain nicotine or tobacco, such as cigarettes and e-cigarettes. These can delay bone healing. If you need help quitting, ask your doctor.  Warm up before you exercise. This helps to prevent more muscle strains.  Ask your doctor when it is safe to drive if you have a splint.  Keep all follow-up visits as told by your doctor. This is important. Contact a doctor if:  You have more pain or swelling in your injured area. Get help right away if:  You have any of these problems in your injured area: ? You have numbness. ? You have tingling. ? You lose a lot of strength. Summary  A muscle strain is an injury that happens when a muscle is stretched longer than normal.  This condition is first treated with PRICE therapy. This includes protecting, resting, icing, adding pressure, and raising your injury.  Limit your activity. Rest your injured muscle as told by your doctor. Your doctor may say that gentle movements are okay.  Warm up before you exercise. This helps to prevent more muscle strains. This information is not intended to replace advice given to you by your health care provider. Make sure you discuss any questions you have with your health care provider. Document Released: 03/24/2008 Document Revised: 07/22/2016 Document Reviewed: 07/22/2016 Elsevier Interactive Patient Education  2019 Elsevier   Inc. Ibuprofen tablets and capsules What is this medicine? IBUPROFEN (eye BYOO proe fen) is a non-steroidal anti-inflammatory drug (NSAID). It is used for dental pain, fever, headaches or migraines, osteoarthritis, rheumatoid arthritis, or painful monthly periods. It can also relieve minor aches and pains caused by a cold, flu, or sore throat. This medicine may be used for other purposes; ask your health care provider or pharmacist if you have questions. COMMON BRAND NAME(S): Advil, Advil  Junior Strength, Advil Migraine, Genpril, Ibren, IBU, Midol, Midol Cramps and Body Aches, Motrin, Motrin IB, Motrin Junior Strength, Motrin Migraine Pain, Samson-8, Toxicology Saliva Collection What should I tell my health care provider before I take this medicine? They need to know if you have any of these conditions: -cigarette smoker -coronary artery bypass graft (CABG) surgery within the past 2 weeks -drink more than 3 alcohol-containing drinks a day -heart disease -high blood pressure -history of stomach bleeding -kidney disease -liver disease -lung or breathing disease, like asthma -an unusual or allergic reaction to ibuprofen, aspirin, other NSAIDs, other medicines, foods, dyes, or preservatives -pregnant or trying to get pregnant -breast-feeding How should I use this medicine? Take this medicine by mouth with a glass of water. Follow the directions on the prescription label. Take this medicine with food if your stomach gets upset. Try to not lie down for at least 10 minutes after you take the medicine. Take your medicine at regular intervals. Do not take your medicine more often than directed. A special MedGuide will be given to you by the pharmacist with each prescription and refill. Be sure to read this information carefully each time. Talk to your pediatrician regarding the use of this medicine in children. Special care may be needed. Overdosage: If you think you have taken too much of this medicine contact a poison control center or emergency room at once. NOTE: This medicine is only for you. Do not share this medicine with others. What if I miss a dose? If you miss a dose, take it as soon as you can. If it is almost time for your next dose, take only that dose. Do not take double or extra doses. What may interact with this medicine? Do not take this medicine with any of the following medications: -cidofovir -ketorolac -methotrexate -pemetrexed This medicine may also interact  with the following medications: -alcohol -aspirin -diuretics -lithium -other drugs for inflammation like prednisone -warfarin This list may not describe all possible interactions. Give your health care provider a list of all the medicines, herbs, non-prescription drugs, or dietary supplements you use. Also tell them if you smoke, drink alcohol, or use illegal drugs. Some items may interact with your medicine. What should I watch for while using this medicine? Tell your doctor or healthcare professional if your symptoms do not start to get better or if they get worse. This medicine does not prevent heart attack or stroke. In fact, this medicine may increase the chance of a heart attack or stroke. The chance may increase with longer use of this medicine and in people who have heart disease. If you take aspirin to prevent heart attack or stroke, talk with your doctor or health care professional. Do not take other medicines that contain aspirin, ibuprofen, or naproxen with this medicine. Side effects such as stomach upset, nausea, or ulcers may be more likely to occur. Many medicines available without a prescription should not be taken with this medicine. This medicine can cause ulcers and bleeding in the stomach and intestines at  any time during treatment. Ulcers and bleeding can happen without warning symptoms and can cause death. To reduce your risk, do not smoke cigarettes or drink alcohol while you are taking this medicine. You may get drowsy or dizzy. Do not drive, use machinery, or do anything that needs mental alertness until you know how this medicine affects you. Do not stand or sit up quickly, especially if you are an older patient. This reduces the risk of dizzy or fainting spells. This medicine can cause you to bleed more easily. Try to avoid damage to your teeth and gums when you brush or floss your teeth. This medicine may be used to treat migraines. If you take migraine medicines for 10 or  more days a month, your migraines may get worse. Keep a diary of headache days and medicine use. Contact your healthcare professional if your migraine attacks occur more frequently. What side effects may I notice from receiving this medicine? Side effects that you should report to your doctor or health care professional as soon as possible: -allergic reactions like skin rash, itching or hives, swelling of the face, lips, or tongue -severe stomach pain -signs and symptoms of bleeding such as bloody or black, tarry stools; red or dark-brown urine; spitting up blood or brown material that looks like coffee grounds; red spots on the skin; unusual bruising or bleeding from the eye, gums, or nose -signs and symptoms of a blood clot such as changes in vision; chest pain; severe, sudden headache; trouble speaking; sudden numbness or weakness of the face, arm, or leg -unexplained weight gain or swelling -unusually weak or tired -yellowing of eyes or skin Side effects that usually do not require medical attention (report to your doctor or health care professional if they continue or are bothersome): -bruising -diarrhea -dizziness, drowsiness -headache -nausea, vomiting This list may not describe all possible side effects. Call your doctor for medical advice about side effects. You may report side effects to FDA at 1-800-FDA-1088. Where should I keep my medicine? Keep out of the reach of children. Store at room temperature between 15 and 30 degrees C (59 and 86 degrees F). Keep container tightly closed. Throw away any unused medicine after the expiration date. NOTE: This sheet is a summary. It may not cover all possible information. If you have questions about this medicine, talk to your doctor, pharmacist, or health care provider.  2019 Elsevier/Gold Standard (2017-02-17 12:43:57)
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
## 2018-08-03 ENCOUNTER — Ambulatory Visit: Payer: Self-pay | Admitting: Family Medicine

## 2018-08-04 ENCOUNTER — Telehealth: Payer: Self-pay

## 2018-08-04 ENCOUNTER — Ambulatory Visit: Payer: Medicaid Other | Admitting: Physical Therapy

## 2018-08-05 NOTE — Telephone Encounter (Signed)
Left a vm for patient to callback 

## 2018-08-09 NOTE — Telephone Encounter (Signed)
Tried to call patient back no answer.  

## 2018-08-11 NOTE — Telephone Encounter (Signed)
Spoke with patient and he will call back later

## 2018-08-15 ENCOUNTER — Inpatient Hospital Stay (HOSPITAL_COMMUNITY)
Admission: RE | Admit: 2018-08-15 | Discharge: 2018-08-19 | DRG: 885 | Disposition: A | Discharge status: Home / Self Care | Payer: Medicaid Other | Attending: Psychiatry | Admitting: Psychiatry

## 2018-08-15 ENCOUNTER — Encounter (HOSPITAL_COMMUNITY): Payer: Self-pay

## 2018-08-15 ENCOUNTER — Other Ambulatory Visit: Payer: Self-pay

## 2018-08-15 DIAGNOSIS — Z955 Presence of coronary angioplasty implant and graft: Secondary | ICD-10-CM

## 2018-08-15 DIAGNOSIS — Z7984 Long term (current) use of oral hypoglycemic drugs: Secondary | ICD-10-CM

## 2018-08-15 DIAGNOSIS — Z91030 Bee allergy status: Secondary | ICD-10-CM

## 2018-08-15 DIAGNOSIS — Z79899 Other long term (current) drug therapy: Secondary | ICD-10-CM | POA: Diagnosis not present

## 2018-08-15 DIAGNOSIS — G8929 Other chronic pain: Secondary | ICD-10-CM | POA: Diagnosis present

## 2018-08-15 DIAGNOSIS — Z888 Allergy status to other drugs, medicaments and biological substances status: Secondary | ICD-10-CM

## 2018-08-15 DIAGNOSIS — F1021 Alcohol dependence, in remission: Secondary | ICD-10-CM

## 2018-08-15 DIAGNOSIS — Z818 Family history of other mental and behavioral disorders: Secondary | ICD-10-CM

## 2018-08-15 DIAGNOSIS — F332 Major depressive disorder, recurrent severe without psychotic features: Secondary | ICD-10-CM | POA: Diagnosis not present

## 2018-08-15 DIAGNOSIS — I2510 Atherosclerotic heart disease of native coronary artery without angina pectoris: Secondary | ICD-10-CM | POA: Diagnosis present

## 2018-08-15 DIAGNOSIS — E785 Hyperlipidemia, unspecified: Secondary | ICD-10-CM | POA: Diagnosis present

## 2018-08-15 DIAGNOSIS — Z7982 Long term (current) use of aspirin: Secondary | ICD-10-CM

## 2018-08-15 DIAGNOSIS — Z8249 Family history of ischemic heart disease and other diseases of the circulatory system: Secondary | ICD-10-CM

## 2018-08-15 DIAGNOSIS — Z91013 Allergy to seafood: Secondary | ICD-10-CM

## 2018-08-15 DIAGNOSIS — Z87891 Personal history of nicotine dependence: Secondary | ICD-10-CM | POA: Diagnosis not present

## 2018-08-15 DIAGNOSIS — E119 Type 2 diabetes mellitus without complications: Secondary | ICD-10-CM | POA: Diagnosis present

## 2018-08-15 DIAGNOSIS — M545 Low back pain: Secondary | ICD-10-CM | POA: Diagnosis present

## 2018-08-15 DIAGNOSIS — Z915 Personal history of self-harm: Secondary | ICD-10-CM | POA: Diagnosis not present

## 2018-08-15 DIAGNOSIS — Z560 Unemployment, unspecified: Secondary | ICD-10-CM | POA: Diagnosis not present

## 2018-08-15 DIAGNOSIS — I252 Old myocardial infarction: Secondary | ICD-10-CM

## 2018-08-15 DIAGNOSIS — Z7951 Long term (current) use of inhaled steroids: Secondary | ICD-10-CM

## 2018-08-15 DIAGNOSIS — G4700 Insomnia, unspecified: Secondary | ICD-10-CM | POA: Diagnosis present

## 2018-08-15 DIAGNOSIS — R45851 Suicidal ideations: Secondary | ICD-10-CM | POA: Diagnosis present

## 2018-08-15 DIAGNOSIS — I10 Essential (primary) hypertension: Secondary | ICD-10-CM | POA: Diagnosis present

## 2018-08-15 DIAGNOSIS — F419 Anxiety disorder, unspecified: Secondary | ICD-10-CM | POA: Diagnosis present

## 2018-08-15 MED ORDER — METFORMIN HCL ER 500 MG PO TB24
500.0000 mg | ORAL_TABLET | Freq: Every day | ORAL | Status: DC
  Administered 2018-08-16 – 2018-08-19 (×4): 500 mg via ORAL
  Filled 2018-08-15 (×6): qty 1

## 2018-08-15 MED ORDER — CARVEDILOL 25 MG PO TABS
25.0000 mg | ORAL_TABLET | Freq: Two times a day (BID) | ORAL | Status: DC
  Administered 2018-08-15 – 2018-08-19 (×8): 25 mg via ORAL
  Filled 2018-08-15 (×12): qty 1

## 2018-08-15 MED ORDER — MAGNESIUM HYDROXIDE 400 MG/5ML PO SUSP: 30.0000 mL | Freq: Every day | ORAL | Status: DC | PRN

## 2018-08-15 MED ORDER — OMEGA-3-ACID ETHYL ESTERS 1 G PO CAPS
1.0000 g | ORAL_CAPSULE | Freq: Two times a day (BID) | ORAL | Status: DC
  Administered 2018-08-15 – 2018-08-19 (×8): 1 g via ORAL
  Filled 2018-08-15 (×12): qty 1

## 2018-08-15 MED ORDER — LISINOPRIL 5 MG PO TABS
5.0000 mg | ORAL_TABLET | Freq: Every day | ORAL | Status: DC
  Administered 2018-08-15 – 2018-08-18 (×4): 5 mg via ORAL
  Filled 2018-08-15 (×6): qty 1

## 2018-08-15 MED ORDER — ARIPIPRAZOLE 2 MG PO TABS
2.0000 mg | ORAL_TABLET | Freq: Every day | ORAL | Status: DC
  Administered 2018-08-15 – 2018-08-18 (×4): 2 mg via ORAL
  Filled 2018-08-15 (×6): qty 1

## 2018-08-15 MED ORDER — ACETAMINOPHEN 325 MG PO TABS
650.0000 mg | ORAL_TABLET | Freq: Four times a day (QID) | ORAL | Status: DC | PRN
  Administered 2018-08-15 – 2018-08-17 (×2): 650 mg via ORAL
  Filled 2018-08-15 (×2): qty 2

## 2018-08-15 MED ORDER — MIRTAZAPINE 15 MG PO TABS
15.0000 mg | ORAL_TABLET | Freq: Every day | ORAL | Status: DC
  Administered 2018-08-15 – 2018-08-18 (×4): 15 mg via ORAL
  Filled 2018-08-15 (×6): qty 1

## 2018-08-15 MED ORDER — TRAZODONE HCL 50 MG PO TABS: 50.0000 mg | ORAL_TABLET | Freq: Every evening | ORAL | Status: DC | PRN

## 2018-08-15 MED ORDER — AMLODIPINE BESYLATE 10 MG PO TABS
10.0000 mg | ORAL_TABLET | Freq: Every day | ORAL | Status: DC
  Administered 2018-08-15 – 2018-08-19 (×5): 10 mg via ORAL
  Filled 2018-08-15 (×7): qty 1

## 2018-08-15 MED ORDER — ALUM & MAG HYDROXIDE-SIMETH 200-200-20 MG/5ML PO SUSP: 30.0000 mL | ORAL | Status: DC | PRN

## 2018-08-15 MED ORDER — HYDROXYZINE HCL 25 MG PO TABS: 25.0000 mg | ORAL_TABLET | Freq: Three times a day (TID) | ORAL | Status: DC | PRN

## 2018-08-15 NOTE — H&P (Signed)
Behavioral Health Medical Screening Exam  [name] is an 58 y.o. male. Present as [place] walk in with passive suicidal  ideations and worsening depression. Reports he recently stopped taken all medications. States " its too many and all I want to do is sleep." reports previous inpatient admissions. Denies support syst. Other than a neighbor. States he has been attending  weekly groups at [facility]. Support, encouragement and reassurances  was provided  Total Time spent with patient: 15 minutes  Psychiatric Specialty Exam: Physical Exam  Vitals reviewed. Constitutional: He appears well-developed.  Cardiovascular: Normal rate.  Psychiatric: He has a normal mood and affect. His behavior is normal.    Review of Systems  Psychiatric/Behavioral: Positive for depression and suicidal ideas (passive). The patient is nervous/anxious.   All other systems reviewed and are negative.   Blood pressure 111/85, pulse 73, temperature 98.7 F (37.1 C), temperature source Oral, SpO2 100 %.There is no height or weight on file to calculate BMI.  General Appearance: Casual and Guarded  Eye Contact:  Fair  Speech:  Clear and Coherent  Volume:  Decreased  Mood:  Depressed  Affect:  Congruent, Depressed and Flat  Thought Process:  Coherent  Orientation:  Full (Time, Place, and Person)  Thought Content:  Hallucinations: None  Suicidal Thoughts:  No passive ideations   Homicidal Thoughts:  No  Memory:  Immediate;   Fair Recent;   Fair Remote;   Fair  Judgement:  Fair  Insight:  Fair  Psychomotor Activity:  Normal  Concentration: Concentration: Fair  Recall:  [employer] of Knowledge:Fair  Language: Fair  Akathisia:  No  Handed:  Right  AIMS (if indicated):     Assets:  Communication Skills Physical Health Resilience Social Support  Sleep:       Musculoskeletal: Strength & Muscle Tone: within normal limits Gait & Station: normal Patient leans: N/A  Blood pressure 111/85, pulse 73,  temperature 98.7 F (37.1 C), temperature source Oral, SpO2 100 %.  Recommendations: Partial hospitalization after inpatient admission  Based on my evaluation the patient does not appear to have an emergency medical condition.  Derrill Center, NP 08/15/2018, 10:31 AM

## 2018-08-15 NOTE — Tx Team (Signed)
Initial Treatment Plan 08/15/2018 12:30 PM [name] GYF:[id number]    PATIENT STRESSORS: Financial difficulties Health problems Medication change or noncompliance   PATIENT STRENGTHS: Ability for insight Active sense of humor Capable of independent living Communication skills   PATIENT IDENTIFIED PROBLEMS: Depression  Suicidal Ideation                   DISCHARGE CRITERIA:  Ability to meet basic life and health needs Adequate post-discharge living arrangements Motivation to continue treatment in a less acute level of care  PRELIMINARY DISCHARGE PLAN: Attend PHP/IOP Outpatient therapy Return to previous living arrangement  PATIENT/FAMILY INVOLVEMENT: This treatment plan has been presented to and reviewed with the patient, [name], and/or family member.  The patient and family have been given the opportunity to ask questions and make suggestions.  Vela Prose, RN 08/15/2018, 12:30 PM
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
## 2018-08-15 NOTE — Progress Notes (Signed)
Admission Note: Patient is a 58 year old male who presents as walk-in with complaints of severe depression.  Patient states symptoms have been increasing and worse since the past three weeks.  Patient currently denies suicidal thoughts, auditory and visual hallucinations.  Patient is alert and oriented x 4.  Presents with sad/flat affect and depressed mood.  Admission plan of care reviewed and consent signed.  Personal belongings and skin assessment completed.  Skin is dry and intact.  No contraband found.  Verbalizes understanding of unit rules and protocols.  Oriented to the unit, staff and room.  Routine safety checks initiated.  Patient is safe on the unit.

## 2018-08-15 NOTE — Telephone Encounter (Signed)
Left another vm for patient to callback  
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
## 2018-08-16 DIAGNOSIS — F332 Major depressive disorder, recurrent severe without psychotic features: Principal | ICD-10-CM

## 2018-08-16 LAB — RAPID URINE DRUG SCREEN, HOSP PERFORMED
Amphetamines: NOT DETECTED
Barbiturates: NOT DETECTED
Benzodiazepines: NOT DETECTED
Cocaine: NOT DETECTED
Opiates: NOT DETECTED
Tetrahydrocannabinol: NOT DETECTED

## 2018-08-16 LAB — COMPREHENSIVE METABOLIC PANEL WITH GFR
ALT: 37 U/L (ref 0–44)
AST: 22 U/L (ref 15–41)
Albumin: 4.3 g/dL (ref 3.5–5.0)
Alkaline Phosphatase: 55 U/L (ref 38–126)
Anion gap: 8 (ref 5–15)
BUN: 14 mg/dL (ref 6–20)
CO2: 23 mmol/L (ref 22–32)
Calcium: 9.3 mg/dL (ref 8.9–10.3)
Chloride: 107 mmol/L (ref 98–111)
Creatinine, Ser: 0.82 mg/dL (ref 0.61–1.24)
GFR calc Af Amer: >60 mL/min
GFR calc non Af Amer: >60 mL/min
Glucose, Bld: 153 mg/dL — ABNORMAL HIGH (ref 70–99)
Potassium: 3.9 mmol/L (ref 3.5–5.1)
Sodium: 138 mmol/L (ref 135–145)
Total Bilirubin: 0.6 mg/dL (ref 0.3–1.2)
Total Protein: 6.9 g/dL (ref 6.5–8.1)

## 2018-08-16 LAB — GLUCOSE, CAPILLARY
GLUCOSE-CAPILLARY: 100 mg/dL — AB (ref 70–99)
Glucose-Capillary: 155 mg/dL — ABNORMAL HIGH (ref 70–99)

## 2018-08-16 LAB — CBC
HCT: 43.1 % (ref 39.0–52.0)
Hemoglobin: 13.7 g/dL (ref 13.0–17.0)
MCH: 28.4 pg (ref 26.0–34.0)
MCHC: 31.8 g/dL (ref 30.0–36.0)
MCV: 89.2 fL (ref 80.0–100.0)
Platelets: 220 10*3/uL (ref 150–400)
RBC: 4.83 MIL/uL (ref 4.22–5.81)
RDW: 12.5 % (ref 11.5–15.5)
WBC: 5.8 10*3/uL (ref 4.0–10.5)
nRBC: 0 % (ref 0.0–0.2)

## 2018-08-16 LAB — TSH: TSH: 1.014 u[IU]/mL (ref 0.350–4.500)

## 2018-08-16 NOTE — Progress Notes (Signed)
Patient ID: [name], male   DOB: 1961/01/27, 58 y.o.   MRN: [id number]  Nursing Progress Note [id number]  Data: On initial approach, patient was resting in his room awake. Patient presents flat and depressed and is guarded/forwards little during interaction. Patient seen resting with his head in his hands at the medication window and reported he felt "terrible". Patient reports he is speaking in terms of his emotional state but declines to discuss this further. Patient compliant with scheduled medications. Patient denies pain/physical complaints. Patient completed self-inventory sheet and rates depression, hopelessness, and anxiety 9,8,6 respectively. Patient rates their sleep and appetite as poor/good respectively. Patient states is "unsure" about his goal for today. Patient tends to isolate in his room. Patient currently reports SI with no current plan and denies HI/AVH.   Action: Patient is educated about and provided medication per provider's orders. Patient safety maintained with q15 min safety checks and frequent rounding. Low fall risk precautions in place. Emotional support given. 1:1 interaction and active listening provided. Patient encouraged to attend meals, groups, and work on treatment plan and goals. Labs, vital signs and patient behavior monitored throughout shift.   Response: Patient contracts for safety with staff. Patient remains safe on the unit at this time and agrees to come to staff with any issues/concerns. Will continue to support and monitor.

## 2018-08-16 NOTE — Progress Notes (Signed)
Recreation Therapy Notes  Animal-Assisted Activity (AAA) Program Checklist/Progress Notes Patient Eligibility Criteria Checklist & Daily Group note for Rec Tx Intervention  Date: 2.18. 20 Time: 1430 Location: [place]  AAA/T Program Assumption of Risk Form signed by Patient/ or Parent Legal Guardian  YES   Patient is free of allergies or sever asthma   YES   Patient reports no fear of animals  YES   Patient reports no history of cruelty to animals  YES   Patient understands his/her participation is voluntary  YES   Patient washes hands before animal contact  YES  Patient washes hands after animal contact  YES   Education: Contractor, Appropriate Animal Interaction   Education Outcome: Acknowledges understanding/In group clarification offered/Needs additional education.   Clinical Observations/Feedback: Pt did not attend group activity.    [name], LRT/CTRS         [name] A 08/16/2018 3:02 PM

## 2018-08-16 NOTE — [facility] Suicide Risk Assessment (Signed)
[facility] INPATIENT:  Family/Significant Other Suicide Prevention Education  Suicide Prevention Education:  Patient Refusal for Family/Significant Other Suicide Prevention Education: The patient [name] has refused to provide written consent for family/significant other to be provided Family/Significant Other Suicide Prevention Education during admission and/or prior to discharge.  Physician notified.  SPE completed with patient, as patient refused to consent to family contact. SPI pamphlet provided to pt and pt was encouraged to share information with support network, ask questions, and talk about any concerns relating to SPE. Patient denies access to guns/firearms and verbalized understanding of information provided. Mobile Crisis information also provided to patient.    [name] 08/16/2018, 1:30 PM

## 2018-08-16 NOTE — Progress Notes (Signed)
[facility] MD Progress Note  08/16/2018 2:59 PM [name]  MRN:  [id number] Subjective: Patient reports persistent depression.  Today denies suicidal ideations.  Currently does not endorse medication side effects.  Objective: I have discussed case with treatment team and have met with patient   58 year old male, known to our unit from prior psychiatric admissions.  Presented for worsening depression, neurovegetative symptoms including anhedonia, low energy ,poor appetite, suicidal ideations. 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  He has also been off his psychiatric medications for several weeks.  He continues to experience grief regarding death of loved ones.  Today patient reports lingering/persistent depression.  Continues to feel sad and depressed.  Denies suicidal plan or intention and contracts for safety on unit.  Does not endorse hallucinations and does not appear internally preoccupied. Currently does not endorse medication side effects.  No disruptive or agitated behaviors on unit, tends to isolate in room, limited group participation thus far.  Labs reviewed-BMP, CBC, TSH unremarkable.  CBG today 155.   Principal Problem:  MDD Diagnosis: Active Problems:   MDD (major depressive disorder), recurrent severe, without psychosis ([place])  Total Time spent with patient: 20 minutes  Past Psychiatric History:   Past Medical History:  Past Medical History:  Diagnosis Date  . Allergies   . Arthritis   . Chronic lower back pain   . Coronary artery disease    a. Multiple prior caths/PCI. Cath 2013 with possible spasm of RCA, 70% ISR of mid LCx with subsequent DES to mLCx and prox LCX. b. H/o microvascular angina. c. Recurrent angina 08/2014 - s/p PTCA/DES to prox Cx, PTCA/CBA to OM1.  c. LHC 06/10/15 with patent stents and some ISR in LCX and  OM-1 that was not flow limiting --> Rx   . Dyslipidemia    a. Intolerant to many statins except tolerating Livalo.  Marland Kitchen GERD (gastroesophageal reflux disease)   . Hypertension   . Myocardial infarction ([place]) ~ 2010  . S/P angioplasty with stent, DES, to proximal and mid LCX 12/15/11 12/15/2011  . Shoulder pain   . Type II diabetes mellitus ([place])     Past Surgical History:  Procedure Laterality Date  . CARDIAC CATHETERIZATION  06/15/2002   LAD with prox 40% stenosis, norma L main, Cfx with 25% lesion, RCA with long mid 25% stenosis (Dr. Vita Barley)  . CARDIAC CATHETERIZATION  04/01/2010   normal L main, LAD wit mild stenosis, L Cfx with 70% in-stent restenosis, RCA with 70% in-stent restenosis, LVEF >60% (Dr. K. Mali Hilty) - cutting ballon arthrectomy to RCA & Cfx (Dr. Rockne Menghini)  . CARDIAC CATHETERIZATION  08/25/2010   preserved global LV contractility; multivessel CAD, diffuse 90-95% in-stent restenosis in prox placed Cfx stent - cutting balloon arthrectomy in Cfx with multiple dilatations 90-95% to 0% stenosis (Dr. Corky Downs)  . CARDIAC CATHETERIZATION  01/26/2011   PCI & stenting of aggresive in-stent restenosis within previously stented AV groove Cfx with 3.0x52m Taxus DES (previous stents were Promus) (Dr. JAdora Fridge  . CARDIAC CATHETERIZATION  05/11/2011   preserved LV function, 40% mid LAD stenosis, 30-40% narrowing proximal to stented semgnet of prox Cfx, patent mid RCA stent with smooth 20% narrowing in distal RCA (Dr. TCorky Downs  . CARDIAC CATHETERIZATION  12/15/2011   PCI & stenting of proximal & mid Cfx with DES - 3.0x129m  in proximal, 3.0x83m in mid (Dr. JAdora Fridge  . CARDIAC CATHETERIZATION N/A 06/10/2015   Procedure: Left Heart Cath and Coronary Angiography;  Surgeon: DJolaine Artist MD;  [facility];  Service: Cardiovascular;  Laterality: N/A;  . cardiometabolic testing  21/74/0814  good exercise effort, peak VO2 79% predicted with normal VO2 HR curves (mild  deconditioning)  . COLONOSCOPY  12/2012   diminutive hyperplastic sigmoid poyp so repeat routine 2024  . EXCISIONAL HEMORRHOIDECTOMY  1984  . LEFT HEART CATHETERIZATION WITH CORONARY ANGIOGRAM N/A 05/11/2011   Procedure: LEFT HEART CATHETERIZATION WITH CORONARY ANGIOGRAM;  Surgeon: TTroy Sine MD;  [facility];  Service: Cardiovascular;  Laterality: N/A;  Possible percutaneous coronary intervention, possible IVUS  . LEFT HEART CATHETERIZATION WITH CORONARY ANGIOGRAM N/A 12/15/2011   Procedure: LEFT HEART CATHETERIZATION WITH CORONARY ANGIOGRAM;  Surgeon: JLorretta Harp MD;  [facility];  Service: Cardiovascular;  Laterality: N/A;  . LEFT HEART CATHETERIZATION WITH CORONARY ANGIOGRAM N/A 09/05/2014   Procedure: LEFT HEART CATHETERIZATION WITH CORONARY ANGIOGRAM;  Surgeon: CBurnell Blanks MD;  [facility];  Service: Cardiovascular;  Laterality: N/A;  . LIPOMA EXCISION     back of the head  . NM MYOCAR PERF WALL MOTION  02/2012   lexiscan myoview; mild perfusion defect in mid inferolateral & basal inferolateral region (infarct/scar); EF 52%, abnormal but ow risk scan  . PERCUTANEOUS CORONARY STENT INTERVENTION (PCI-S)  09/05/2014   Procedure: PERCUTANEOUS CORONARY STENT INTERVENTION (PCI-S);  Surgeon: CBurnell Blanks MD;  [facility];  Service: Cardiovascular;;   Family History:  Family History  Problem Relation Age of Onset  . Leukemia Mother   . Prostate cancer Father   . Coronary artery disease Paternal Grandmother   . Cancer Paternal Grandfather   . Cancer Brother    Family Psychiatric  History: Social History:  Social History   Substance and Sexual Activity  Alcohol Use Yes     Social History   Substance and Sexual Activity  Drug Use No   Comment: 12/15/11 "last cocaine was 2010"    Social History   Socioeconomic History  . Marital status: Divorced    Spouse name: Not on file  . Number of children: 2  . Years of  education: GED  . Highest education level: Not on file  Occupational History  . Not on file  Social Needs  . Financial resource strain: Not on file  . Food insecurity:    Worry: Not on file    Inability: Not on file  . Transportation needs:    Medical: Not on file    Non-medical: Not on file  Tobacco Use  . Smoking status: Former Smoker    Packs/day: 1.00    Years: 10.00    Pack years: 10.00    Types: Cigarettes    Last attempt to quit: 04/07/2009    Years since quitting: 9.3  . Smokeless tobacco: Never Used  Substance and Sexual Activity  . Alcohol use: Yes  . Drug use: No    Comment: 12/15/11 "last cocaine was 2010"  . Sexual activity: Not Currently  Lifestyle  . Physical activity:    Days per week: Not on file    Minutes per session: Not on file  . Stress: Not on file  Relationships  . Social connections:    Talks on phone: Not on file    Gets together: Not on file    Attends religious service:  Not on file    Active member of club or organization: Not on file    Attends meetings of clubs or organizations: Not on file    Relationship status: Not on file  Other Topics Concern  . Not on file  Social History Narrative  . Not on file   Additional Social History:    Pain Medications: pt denies abuse - see pta meds list Prescriptions: pt denies abuse - see pta meds list Over the Counter: pt denies abuse - see pta meds list History of alcohol / drug use?: Yes Longest period of sobriety (when/how long): pt states about 8 years Negative Consequences of Use: Personal relationships  Sleep: Improving  Appetite:  Fair  Current Medications: Current Facility-Administered Medications  Medication Dose Route Frequency Provider Last Rate Last Dose  . acetaminophen (TYLENOL) tablet 650 mg  650 mg Oral Q6H PRN Derrill Center, NP   650 mg at 08/15/18 1726  . alum & mag hydroxide-simeth (MAALOX/MYLANTA) 200-200-20 MG/5ML suspension 30 mL  30 mL Oral Q4H PRN Derrill Center, NP       . amLODipine (NORVASC) tablet 10 mg  10 mg Oral Daily Sharma Covert, MD   10 mg at 08/16/18 0826  . ARIPiprazole (ABILIFY) tablet 2 mg  2 mg Oral QHS Sharma Covert, MD   2 mg at 08/15/18 2138  . carvedilol (COREG) tablet 25 mg  25 mg Oral BID WC Sharma Covert, MD   25 mg at 08/16/18 4970  . hydrOXYzine (ATARAX/VISTARIL) tablet 25 mg  25 mg Oral TID PRN Derrill Center, NP      . lisinopril (PRINIVIL,ZESTRIL) tablet 5 mg  5 mg Oral Daily Sharma Covert, MD   5 mg at 08/16/18 2637  . magnesium hydroxide (MILK OF MAGNESIA) suspension 30 mL  30 mL Oral Daily PRN Derrill Center, NP      . metFORMIN (GLUCOPHAGE-XR) 24 hr tablet 500 mg  500 mg Oral Q breakfast Sharma Covert, MD   500 mg at 08/16/18 8588  . mirtazapine (REMERON) tablet 15 mg  15 mg Oral QHS Sharma Covert, MD   15 mg at 08/15/18 2137  . omega-3 acid ethyl esters (LOVAZA) capsule 1 g  1 g Oral BID Sharma Covert, MD   1 g at 08/16/18 [id number]  . traZODone (DESYREL) tablet 50 mg  50 mg Oral QHS PRN Sharma Covert, MD        Lab Results:  Results for orders placed or performed during the hospital encounter of 08/15/18 (from the past 48 hour(s))  Urine rapid drug screen (hosp performed)not at [facility]     Status: None   Collection Time: 08/15/18 11:31 AM  Result Value Ref Range   Opiates NONE DETECTED NONE DETECTED   Cocaine NONE DETECTED NONE DETECTED   Benzodiazepines NONE DETECTED NONE DETECTED   Amphetamines NONE DETECTED NONE DETECTED   Tetrahydrocannabinol NONE DETECTED NONE DETECTED   Barbiturates NONE DETECTED NONE DETECTED    Comment: (NOTE) DRUG SCREEN FOR MEDICAL PURPOSES ONLY.  IF CONFIRMATION IS NEEDED FOR ANY PURPOSE, NOTIFY LAB WITHIN 5 DAYS. LOWEST DETECTABLE LIMITS FOR URINE DRUG SCREEN Drug Class                     Cutoff (ng/mL) Amphetamine and metabolites    1000 Barbiturate and metabolites    200 Benzodiazepine  956 Tricyclics and metabolites      300 Opiates and metabolites        300 Cocaine and metabolites        300 THC                            50 Performed at [facility], [place] [address], [place], [place] [address]   Glucose, capillary     Status: Abnormal   Collection Time: 08/15/18  5:05 PM  Result Value Ref Range   Glucose-Capillary 100 (H) 70 - 99 mg/dL   Comment 1 Notify RN    Comment 2 Document in Chart   TSH     Status: None   Collection Time: 08/16/18  6:33 AM  Result Value Ref Range   TSH 1.014 0.350 - 4.500 uIU/mL    Comment: Performed by a 3rd Generation assay with a functional sensitivity of <=0.01 uIU/mL. Performed at [facility], [place] [address], [place], [place] [address]   CBC     Status: None   Collection Time: 08/16/18  6:33 AM  Result Value Ref Range   WBC 5.8 4.0 - 10.5 K/uL   RBC 4.83 4.22 - 5.81 MIL/uL   Hemoglobin 13.7 13.0 - 17.0 g/dL   HCT 43.1 39.0 - 52.0 %   MCV 89.2 80.0 - 100.0 fL   MCH 28.4 26.0 - 34.0 pg   MCHC 31.8 30.0 - 36.0 g/dL   RDW 12.5 11.5 - 15.5 %   Platelets 220 150 - 400 K/uL   nRBC 0.0 0.0 - 0.2 %    Comment: Performed at [facility], [place] [address] [place], [place] [address]  Comprehensive metabolic panel     Status: Abnormal   Collection Time: 08/16/18  6:33 AM  Result Value Ref Range   Sodium 138 135 - 145 mmol/L   Potassium 3.9 3.5 - 5.1 mmol/L   Chloride 107 98 - 111 mmol/L   CO2 23 22 - 32 mmol/L   Glucose, Bld 153 (H) 70 - 99 mg/dL   BUN 14 6 - 20 mg/dL   Creatinine, Ser 0.82 0.61 - 1.24 mg/dL   Calcium 9.3 8.9 - 10.3 mg/dL   Total Protein 6.9 6.5 - 8.1 g/dL   Albumin 4.3 3.5 - 5.0 g/dL   AST 22 15 - 41 U/L   ALT 37 0 - 44 U/L   Alkaline Phosphatase 55 38 - 126 U/L   Total Bilirubin 0.6 0.3 - 1.2 mg/dL   GFR calc non Af Amer >60 >60 mL/min   GFR calc Af Amer >60 >60 mL/min   Anion gap 8 5 - 15    Comment: Performed at [facility], [place] [address] [place], [place]  [address]  Glucose, capillary     Status: Abnormal   Collection Time: 08/16/18  6:39 AM  Result Value Ref Range   Glucose-Capillary 155 (H) 70 - 99 mg/dL    Blood Alcohol level:  Lab Results  Component Value Date   ETH <10 06/21/2017   ETH <5 30/16/0109    Metabolic Disorder Labs: Lab Results  Component Value Date   HGBA1C 5.8 (A) 07/15/2018   MPG 125.5 10/03/2017   MPG 128.37 06/25/2017   No results found for: PROLACTIN Lab Results  Component Value Date   CHOL 144 10/03/2017   TRIG 157 (H) 10/03/2017   HDL 51 10/03/2017   CHOLHDL 2.8  10/03/2017   VLDL 31 10/03/2017   LDLCALC 62 10/03/2017   LDLCALC 85 07/28/2017    Physical Findings: AIMS: Facial and Oral Movements Muscles of Facial Expression: None, normal Lips and Perioral Area: None, normal Jaw: None, normal Tongue: None, normal,Extremity Movements Upper (arms, wrists, hands, fingers): None, normal Lower (legs, knees, ankles, toes): None, normal, Trunk Movements Neck, shoulders, hips: None, normal, Overall Severity Severity of abnormal movements (highest score from questions above): None, normal Incapacitation due to abnormal movements: None, normal Patient's awareness of abnormal movements (rate only patient's report): No Awareness, Dental Status Current problems with teeth and/or dentures?: No Does patient usually wear dentures?: No  CIWA:    COWS:     Musculoskeletal: Strength & Muscle Tone: within normal limits Gait & Station: normal Patient leans: N/A  Psychiatric Specialty Exam: Physical Exam  ROS denies chest pain, no shortness of breath, no vomiting  Blood pressure 138/90, pulse 72, temperature 98.6 F (37 C), temperature source Oral, resp. rate 18, height _0  (1.727 m), weight 85.3 kg, SpO2 98 %.Body mass index is 28.59 kg/m.  General Appearance: Fairly Groomed  Eye Contact:  Fair  Speech:  Normal Rate  Volume:  Decreased  Mood:  Depressed and Dysphoric  Affect:  Congruent and Constricted,  does smile briefly at times  Thought Process:  Linear and Descriptions of Associations: Intact  Orientation:  Full (Time, Place, and Person)  Thought Content:  No hallucinations, no delusions expressed  Suicidal Thoughts:  No at this time denies suicidal ideations and contracts for safety, denies homicidal or violent ideations  Homicidal Thoughts:  No  Memory:  Recent and remote grossly intact  Judgement:  Fair  Insight:  Fair  Psychomotor Activity:  Decreased  Concentration:  Concentration: Fair and Attention Span: Fair  Recall:  Good  Fund of Knowledge:  Good  Language:  Good  Akathisia:  Negative  Handed:  Right  AIMS (if indicated):     Assets:  Communication Skills Desire for Improvement Resilience  ADL's:  Intact  Cognition:  WNL  Sleep:  Number of Hours: 6.75   Assessment: 58 year old male, known to our unit from prior psychiatric admissions.  Presented for worsening depression, neurovegetative symptoms including anhedonia, low energy ,poor appetite, suicidal ideations. 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  He has also been off his psychiatric medications for several weeks.  He continues to experience grief regarding death of loved ones.  Today patient presents depressed, with constricted affect, fair eye contact, continues to endorse neurovegetative symptoms, and tends to isolate in his room.  At this time denies suicidal ideations and contracts for safety on unit.  Tolerating medications well thus far.    Treatment Plan Summary: Daily contact with patient to assess and evaluate symptoms and progress in treatment, Medication management, Plan Inpatient treatment and Medications as below Encourage group and milieu participation to work on coping skills and symptom reduction Continue Abilify 2 mg daily for mood  disorder/augmentation Continue Vistaril 25 mg every 8 hours PRN for anxiety as needed Continue Remeron 15 mg nightly for depression and insomnia Continue lisinopril, amlodipine, carvedilol for hypertension Continue Metformin for DM Jenne Campus, MD 08/16/2018, 2:59 PM

## 2018-08-16 NOTE — [facility] Group Notes (Signed)
Adult Psychoeducational Group Note  Date:  08/16/2018 Time:  10:20 PM  Group Topic/Focus:  Wrap-Up Group:   The focus of this group is to help patients review their daily goal of treatment and discuss progress on daily workbooks.  Participation Level:  None  Participation Quality:  Supportive  Affect:  Flat  Cognitive:  Alert and Appropriate  Insight: None  Engagement in Group:  None  Modes of Intervention:  Support  Additional Comments:  Pt attended wrap up group this evening. Pt opted not to participate in group this evening.    Cristi Loron 08/16/2018, 10:20 PM
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
## 2018-08-16 NOTE — Progress Notes (Signed)
Patient ID: [name], male   DOB: 07-09-1960, 58 y.o.   MRN: [id number]   D: Patient pleasant on approach tonight. Isolating in his room during group. Requested urine sample but unable to give yet. Reports increased depression. No active SI tonight. A: Staff will monitor on q 15 minute checks, follow treatment plans, and give medications as ordered. R: Cooperative on the unit

## 2018-08-16 NOTE — [facility] Group Notes (Signed)
LCSW Group Therapy Note 08/16/2018 3:29 PM  Type of Therapy/Topic: Group Therapy: Feelings about Diagnosis  Participation Level: Did Not Attend   Description of Group:  This group will allow patients to explore their thoughts and feelings about diagnoses they have received. Patients will be guided to explore their level of understanding and acceptance of these diagnoses. Facilitator will encourage patients to process their thoughts and feelings about the reactions of others to their diagnosis and will guide patients in identifying ways to discuss their diagnosis with significant others in their lives. This group will be process-oriented, with patients participating in exploration of their own experiences, giving and receiving support, and processing challenge from other group members.  Therapeutic Goals: 1. Patient will demonstrate understanding of diagnosis as evidenced by identifying two or more symptoms of the disorder 2. Patient will be able to express two feelings regarding the diagnosis 3. Patient will demonstrate their ability to communicate their needs through discussion and/or role play  Summary of Patient Progress:  Invited, chose not to attend.     Therapeutic Modalities:  Cognitive Behavioral Therapy Brief Therapy Feelings Identification    [place] Clinical Social Worker

## 2018-08-16 NOTE — Plan of Care (Signed)
D: Patient in dayroom not interacting on approach. Patient is alert, oriented, pleasant, and cooperative. Denies SI, HI, AVH, and verbally contracts for safety. Patient reports today was better than yesterday. Patient appears to be in no distress/pain.   A: Medications administered per MD order. Support provided. Patient educated on safety on the unit and medications. Routine safety checks every 15 minutes. Patient stated understanding to tell nurse about any new physical symptoms. Patient understands to tell staff of any needs.     R: No adverse drug reactions noted. Patient verbally contracts for safety. Patient remains safe at this time and will continue to monitor.   Problem: Education: Goal: Mental status will improve Outcome: Progressing  Patient denies SI, HI, AVH. and contracts for safety.

## 2018-08-17 LAB — GLUCOSE, CAPILLARY: Glucose-Capillary: 145 mg/dL — ABNORMAL HIGH (ref 70–99)

## 2018-08-17 MED ORDER — IBUPROFEN 400 MG PO TABS
400.0000 mg | ORAL_TABLET | Freq: Four times a day (QID) | ORAL | Status: DC | PRN
  Administered 2018-08-17 – 2018-08-19 (×2): 400 mg via ORAL
  Filled 2018-08-17 (×2): qty 1

## 2018-08-17 NOTE — Progress Notes (Addendum)
[facility] MD Progress Note  08/17/2018 10:38 AM [name]  MRN:  [id number] Subjective:  "I'm about the same I guess."  [name] found lying in bed with eyes closed. Per chart review he has been isolating to room and not attending groups. He reports ongoing depressed mood but does report some improvement since yesterday. Initially presents with flat affect, speaking in low voice with eyes closed, but later opens his eyes and smiles at times appropriately with conversation. 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 At that point he stopped going to the gym and practicing music; he reports gaining 11-12 lbs over the last several weeks. His depression was previously well-managed on Remeron and Abilify, which were restarted 2 nights ago. Denies SI/HI/AVH. Denies medication side effects. Morning CBG 145. He has chronic pain from several past injuries, requests ibuprofen PRN.  From admission H&P: Patient is a 58 year old male with a past psychiatric history significant for major depression as well as alcohol dependence in remission who presented as a walk-in patient to the [facility] behavioral health hospital for an evaluation. The patient stated that he had been off his medications for approximately 3 months. He stopped these after he had been feeling better. He stated that recently his depressive symptoms have gotten worse. He was isolating, having anhedonia, hopelessness, guilt, and fatigue. He stated he was unable to get out of bed over the last 2 weeks. He admitted to suicidal ideation. His past attempts included intentionally driving a car off the bridge and an overdose of medications. 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 He has a past psychiatric history significant for status post cardial infarction, diabetes mellitus type 2,  hypertension and hyperlipidemia. The patient's last psychiatric hospitalization was in April 2019. He was discharged on Abilify, Remeron. He stated he has been attending a support group [facility] on a weekly basis, but has not seen a psychiatrist since his discharge from our facility. He stated he had been sober since May 7 of last year.   Principal Problem: MDD (major depressive disorder), recurrent severe, without psychosis ([place]) Diagnosis: Principal Problem:   MDD (major depressive disorder), recurrent severe, without psychosis ([place])  Total Time spent with patient: 15 minutes  Past Psychiatric History: See admission H&P  Past Medical History:  Past Medical History:  Diagnosis Date  . Allergies   . Arthritis   . Chronic lower back pain   . Coronary artery disease    a. Multiple prior caths/PCI. Cath 2013 with possible spasm of RCA, 70% ISR of mid LCx with subsequent DES to mLCx and prox LCX. b. H/o microvascular angina. c. Recurrent angina 08/2014 - s/p PTCA/DES to prox Cx, PTCA/CBA to OM1.  c. LHC 06/10/15 with patent stents and some ISR in LCX and OM-1 that was not flow limiting --> Rx   . Dyslipidemia    a. Intolerant to many statins except tolerating Livalo.  Marland Kitchen GERD (gastroesophageal reflux disease)   . Hypertension   . Myocardial infarction ([place]) ~ 2010  . S/P angioplasty with stent, DES, to proximal and mid LCX 12/15/11 12/15/2011  . Shoulder pain   . Type II diabetes mellitus ([place])     Past Surgical History:  Procedure Laterality Date  . CARDIAC CATHETERIZATION  06/15/2002   LAD with prox 40% stenosis, norma L  main, Cfx with 25% lesion, RCA with long mid 25% stenosis (Dr. Vita Barley)  . CARDIAC CATHETERIZATION  04/01/2010   normal L main, LAD wit mild stenosis, L Cfx with 70% in-stent restenosis, RCA with 70% in-stent restenosis, LVEF >60% (Dr. K. Mali Hilty) - cutting ballon arthrectomy to RCA & Cfx (Dr. Rockne Menghini)  . CARDIAC CATHETERIZATION  08/25/2010   preserved  global LV contractility; multivessel CAD, diffuse 90-95% in-stent restenosis in prox placed Cfx stent - cutting balloon arthrectomy in Cfx with multiple dilatations 90-95% to 0% stenosis (Dr. Corky Downs)  . CARDIAC CATHETERIZATION  01/26/2011   PCI & stenting of aggresive in-stent restenosis within previously stented AV groove Cfx with 3.0x60mm Taxus DES (previous stents were Promus) (Dr. Adora Fridge)  . CARDIAC CATHETERIZATION  05/11/2011   preserved LV function, 40% mid LAD stenosis, 30-40% narrowing proximal to stented semgnet of prox Cfx, patent mid RCA stent with smooth 20% narrowing in distal RCA (Dr. Corky Downs)  . CARDIAC CATHETERIZATION  12/15/2011   PCI & stenting of proximal & mid Cfx with DES - 3.0x42mm in proximal, 3.0x63mm in mid (Dr. Adora Fridge)  . CARDIAC CATHETERIZATION N/A 06/10/2015   Procedure: Left Heart Cath and Coronary Angiography;  Surgeon: Jolaine Artist, MD;  Location: [facility];  Service: Cardiovascular;  Laterality: N/A;  . cardiometabolic testing  6/38/7564   good exercise effort, peak VO2 79% predicted with normal VO2 HR curves (mild deconditioning)  . COLONOSCOPY  12/2012   diminutive hyperplastic sigmoid poyp so repeat routine 2024  . EXCISIONAL HEMORRHOIDECTOMY  1984  . LEFT HEART CATHETERIZATION WITH CORONARY ANGIOGRAM N/A 05/11/2011   Procedure: LEFT HEART CATHETERIZATION WITH CORONARY ANGIOGRAM;  Surgeon: Troy Sine, MD;  Location: [facility] CATH LAB;  Service: Cardiovascular;  Laterality: N/A;  Possible percutaneous coronary intervention, possible IVUS  . LEFT HEART CATHETERIZATION WITH CORONARY ANGIOGRAM N/A 12/15/2011   Procedure: LEFT HEART CATHETERIZATION WITH CORONARY ANGIOGRAM;  Surgeon: Lorretta Harp, MD;  Location: [facility] CATH LAB;  Service: Cardiovascular;  Laterality: N/A;  . LEFT HEART CATHETERIZATION WITH CORONARY ANGIOGRAM N/A 09/05/2014   Procedure: LEFT HEART CATHETERIZATION WITH CORONARY ANGIOGRAM;  Surgeon: Burnell Blanks, MD;  Location: [facility]  CATH LAB;  Service: Cardiovascular;  Laterality: N/A;  . LIPOMA EXCISION     back of the head  . NM MYOCAR PERF WALL MOTION  02/2012   lexiscan myoview; mild perfusion defect in mid inferolateral & basal inferolateral region (infarct/scar); EF 52%, abnormal but ow risk scan  . PERCUTANEOUS CORONARY STENT INTERVENTION (PCI-S)  09/05/2014   Procedure: PERCUTANEOUS CORONARY STENT INTERVENTION (PCI-S);  Surgeon: Burnell Blanks, MD;  Location: [facility] CATH LAB;  Service: Cardiovascular;;   Family History:  Family History  Problem Relation Age of Onset  . Leukemia Mother   . Prostate cancer Father   . Coronary artery disease Paternal Grandmother   . Cancer Paternal Grandfather   . Cancer Brother    Family Psychiatric  History: See admission H&P Social History:  Social History   Substance and Sexual Activity  Alcohol Use Yes     Social History   Substance and Sexual Activity  Drug Use No   Comment: 12/15/11 "last cocaine was 2010"    Social History   Socioeconomic History  . Marital status: Divorced    Spouse name: Not on file  . Number of children: 2  . Years of education: GED  . Highest education level: Not on file  Occupational History  . Not  on file  Social Needs  . Financial resource strain: Not on file  . Food insecurity:    Worry: Not on file    Inability: Not on file  . Transportation needs:    Medical: Not on file    Non-medical: Not on file  Tobacco Use  . Smoking status: Former Smoker    Packs/day: 1.00    Years: 10.00    Pack years: 10.00    Types: Cigarettes    Last attempt to quit: 04/07/2009    Years since quitting: 9.3  . Smokeless tobacco: Never Used  Substance and Sexual Activity  . Alcohol use: Yes  . Drug use: No    Comment: 12/15/11 "last cocaine was 2010"  . Sexual activity: Not Currently  Lifestyle  . Physical activity:    Days per week: Not on file    Minutes per session: Not on file  . Stress: Not on file  Relationships  . Social  connections:    Talks on phone: Not on file    Gets together: Not on file    Attends religious service: Not on file    Active member of club or organization: Not on file    Attends meetings of clubs or organizations: Not on file    Relationship status: Not on file  Other Topics Concern  . Not on file  Social History Narrative  . Not on file   Additional Social History:    Pain Medications: pt denies abuse - see pta meds list Prescriptions: pt denies abuse - see pta meds list Over the Counter: pt denies abuse - see pta meds list History of alcohol / drug use?: Yes Longest period of sobriety (when/how long): pt states about 8 years Negative Consequences of Use: Personal relationships                    Sleep: Good  Appetite:  Good  Current Medications: Current Facility-Administered Medications  Medication Dose Route Frequency Provider Last Rate Last Dose  . alum & mag hydroxide-simeth (MAALOX/MYLANTA) 200-200-20 MG/5ML suspension 30 mL  30 mL Oral Q4H PRN Derrill Center, NP      . amLODipine (NORVASC) tablet 10 mg  10 mg Oral Daily Sharma Covert, MD   10 mg at 08/17/18 0742  . ARIPiprazole (ABILIFY) tablet 2 mg  2 mg Oral QHS Sharma Covert, MD   2 mg at 08/16/18 2106  . carvedilol (COREG) tablet 25 mg  25 mg Oral BID WC Sharma Covert, MD   25 mg at 08/17/18 0960  . hydrOXYzine (ATARAX/VISTARIL) tablet 25 mg  25 mg Oral TID PRN Derrill Center, NP      . ibuprofen (ADVIL,MOTRIN) tablet 400 mg  400 mg Oral Q6H PRN Connye Burkitt, NP      . lisinopril (PRINIVIL,ZESTRIL) tablet 5 mg  5 mg Oral Daily Sharma Covert, MD   5 mg at 08/17/18 0742  . magnesium hydroxide (MILK OF MAGNESIA) suspension 30 mL  30 mL Oral Daily PRN Derrill Center, NP      . metFORMIN (GLUCOPHAGE-XR) 24 hr tablet 500 mg  500 mg Oral Q breakfast Sharma Covert, MD   500 mg at 08/17/18 0742  . mirtazapine (REMERON) tablet 15 mg  15 mg Oral QHS Sharma Covert, MD   15 mg at  08/16/18 2106  . omega-3 acid ethyl esters (LOVAZA) capsule 1 g  1 g Oral BID Sharma Covert, MD  1 g at 08/17/18 0742  . traZODone (DESYREL) tablet 50 mg  50 mg Oral QHS PRN Sharma Covert, MD        Lab Results:  Results for orders placed or performed during the hospital encounter of 08/15/18 (from the past 48 hour(s))  Urine rapid drug screen (hosp performed)not at [facility]     Status: None   Collection Time: 08/15/18 11:31 AM  Result Value Ref Range   Opiates NONE DETECTED NONE DETECTED   Cocaine NONE DETECTED NONE DETECTED   Benzodiazepines NONE DETECTED NONE DETECTED   Amphetamines NONE DETECTED NONE DETECTED   Tetrahydrocannabinol NONE DETECTED NONE DETECTED   Barbiturates NONE DETECTED NONE DETECTED    Comment: (NOTE) DRUG SCREEN FOR MEDICAL PURPOSES ONLY.  IF CONFIRMATION IS NEEDED FOR ANY PURPOSE, NOTIFY LAB WITHIN 5 DAYS. LOWEST DETECTABLE LIMITS FOR URINE DRUG SCREEN Drug Class                     Cutoff (ng/mL) Amphetamine and metabolites    1000 Barbiturate and metabolites    200 Benzodiazepine                 765 Tricyclics and metabolites     300 Opiates and metabolites        300 Cocaine and metabolites        300 THC                            50 Performed at [facility], [place] [address] [place], [place] [address]   Glucose, capillary     Status: Abnormal   Collection Time: 08/15/18  5:05 PM  Result Value Ref Range   Glucose-Capillary 100 (H) 70 - 99 mg/dL   Comment 1 Notify RN    Comment 2 Document in Chart   TSH     Status: None   Collection Time: 08/16/18  6:33 AM  Result Value Ref Range   TSH 1.014 0.350 - 4.500 uIU/mL    Comment: Performed by a 3rd Generation assay with a functional sensitivity of <=0.01 uIU/mL. Performed at [facility], [place] [address] [place], [place] [address]   CBC     Status: None   Collection Time: 08/16/18  6:33 AM  Result Value Ref Range   WBC 5.8 4.0 - 10.5 K/uL   RBC 4.83  4.22 - 5.81 MIL/uL   Hemoglobin 13.7 13.0 - 17.0 g/dL   HCT 43.1 39.0 - 52.0 %   MCV 89.2 80.0 - 100.0 fL   MCH 28.4 26.0 - 34.0 pg   MCHC 31.8 30.0 - 36.0 g/dL   RDW 12.5 11.5 - 15.5 %   Platelets 220 150 - 400 K/uL   nRBC 0.0 0.0 - 0.2 %    Comment: Performed at [facility], [place] [address], [place], [place] [address]  Comprehensive metabolic panel     Status: Abnormal   Collection Time: 08/16/18  6:33 AM  Result Value Ref Range   Sodium 138 135 - 145 mmol/L   Potassium 3.9 3.5 - 5.1 mmol/L   Chloride 107 98 - 111 mmol/L   CO2 23 22 - 32 mmol/L   Glucose, Bld 153 (H) 70 - 99 mg/dL   BUN 14 6 - 20 mg/dL   Creatinine, Ser 0.82 0.61 - 1.24 mg/dL   Calcium 9.3 8.9 - 10.3 mg/dL   Total Protein 6.9 6.5 -  8.1 g/dL   Albumin 4.3 3.5 - 5.0 g/dL   AST 22 15 - 41 U/L   ALT 37 0 - 44 U/L   Alkaline Phosphatase 55 38 - 126 U/L   Total Bilirubin 0.6 0.3 - 1.2 mg/dL   GFR calc non Af Amer >60 >60 mL/min   GFR calc Af Amer >60 >60 mL/min   Anion gap 8 5 - 15    Comment: Performed at [facility], [place] [address] [place], [place] [address]  Glucose, capillary     Status: Abnormal   Collection Time: 08/16/18  6:39 AM  Result Value Ref Range   Glucose-Capillary 155 (H) 70 - 99 mg/dL  Glucose, capillary     Status: Abnormal   Collection Time: 08/17/18  6:05 AM  Result Value Ref Range   Glucose-Capillary 145 (H) 70 - 99 mg/dL   Comment 1 Notify RN    Comment 2 Document in Chart     Blood Alcohol level:  Lab Results  Component Value Date   ETH <10 06/21/2017   ETH <5 76/19/5093    Metabolic Disorder Labs: Lab Results  Component Value Date   HGBA1C 5.8 (A) 07/15/2018   MPG 125.5 10/03/2017   MPG 128.37 06/25/2017   No results found for: PROLACTIN Lab Results  Component Value Date   CHOL 144 10/03/2017   TRIG 157 (H) 10/03/2017   HDL 51 10/03/2017   CHOLHDL 2.8 10/03/2017   VLDL 31 10/03/2017   LDLCALC 62 10/03/2017   LDLCALC 85  07/28/2017    Physical Findings: AIMS: Facial and Oral Movements Muscles of Facial Expression: None, normal Lips and Perioral Area: None, normal Jaw: None, normal Tongue: None, normal,Extremity Movements Upper (arms, wrists, hands, fingers): None, normal Lower (legs, knees, ankles, toes): None, normal, Trunk Movements Neck, shoulders, hips: None, normal, Overall Severity Severity of abnormal movements (highest score from questions above): None, normal Incapacitation due to abnormal movements: None, normal Patient's awareness of abnormal movements (rate only patient's report): No Awareness, Dental Status Current problems with teeth and/or dentures?: No Does patient usually wear dentures?: No  CIWA:    COWS:     Musculoskeletal: Strength & Muscle Tone: within normal limits Gait & Station: UTA- lying in bed Patient leans: N/A  Psychiatric Specialty Exam: Physical Exam  Nursing note and vitals reviewed. Constitutional: He is oriented to person, place, and time. He appears well-developed and well-nourished.  Cardiovascular: Normal rate.  Respiratory: Effort normal.  Neurological: He is alert and oriented to person, place, and time.    Review of Systems  Constitutional: Negative.   Psychiatric/Behavioral: Positive for depression. Negative for hallucinations, memory loss, substance abuse and suicidal ideas. The patient is not nervous/anxious and does not have insomnia.     Blood pressure (!) 129/100, pulse 76, temperature 98.4 F (36.9 C), temperature source Oral, resp. rate 18, height 5\' 8"  (1.727 m), weight 85.3 kg, SpO2 98 %.Body mass index is 28.59 kg/m.  General Appearance: Disheveled  Eye Contact:  Fair  Speech:  Slow  Volume:  Decreased  Mood:  Depressed  Affect:  Congruent  Thought Process:  Coherent  Orientation:  Full (Time, Place, and Person)  Thought Content:  WDL  Suicidal Thoughts:  No  Homicidal Thoughts:  No  Memory:  Immediate;   Fair Recent;   Fair   Judgement:  Fair  Insight:  Fair  Psychomotor Activity:  Decreased  Concentration:  Concentration: Good  Recall:  [place] of Knowledge:  Fair  Language:  Good  Akathisia:  No  Handed:  Right  AIMS (if indicated):     Assets:  Communication Skills Desire for Improvement Housing Leisure Time Resilience  ADL's:  Intact  Cognition:  WNL  Sleep:  Number of Hours: 6.5     Treatment Plan Summary: Daily contact with patient to assess and evaluate symptoms and progress in treatment and Medication management   Continue inpatient hospitalization.  Discontinue Tylenol Start ibuprofen 400 mg PO Q6HR PRN pain Continue Remeron 15 mg PO QHS for mood Continue Abilify 2 mg PO QHS for mood Continue Vistaril 25 mg PO TID PRN anxiety Continue trazodone 50 mg PO QHS PRN insomnia Continue Norvasc 10 mg PO daily for HTN Continue Coreg 25 mg PO BID for HTN Continue metformin 500 mg PO daily for diabetes  Patient will participate in the therapeutic group milieu.  Discharge disposition in progress.   Connye Burkitt, NP 08/17/2018, 10:38 AM   Agree with NP progress note

## 2018-08-17 NOTE — [facility] Group Notes (Signed)
Adult Psychoeducational Group Note  Date:  08/17/2018 Time:  10:52 PM  Group Topic/Focus:  Wrap-Up Group:   The focus of this group is to help patients review their daily goal of treatment and discuss progress on daily workbooks.  Participation Level:  Active  Participation Quality:  Appropriate and Attentive  Affect:  Appropriate  Cognitive:  Alert  Insight: Appropriate and Good  Engagement in Group:  Engaged  Modes of Intervention:  Discussion and Education  Additional Comments:  Pt attended and participated in wrap up group this evening. Pt rated their day an 8/10, due to them having a really good day and them doing more than they did yesterday.   Cristi Loron 08/17/2018, 10:52 PM

## 2018-08-17 NOTE — Tx Team (Signed)
Interdisciplinary Treatment and Diagnostic Plan Update  08/17/2018 Time of Session: 10:30am [name] MRN: [id number]  Principal Diagnosis: MDD (major depressive disorder), recurrent severe, without psychosis ([place])  Secondary Diagnoses: Principal Problem:   MDD (major depressive disorder), recurrent severe, without psychosis ([place])   Current Medications:  Current Facility-Administered Medications  Medication Dose Route Frequency Provider Last Rate Last Dose  . alum & mag hydroxide-simeth (MAALOX/MYLANTA) 200-200-20 MG/5ML suspension 30 mL  30 mL Oral Q4H PRN Derrill Center, NP      . amLODipine (NORVASC) tablet 10 mg  10 mg Oral Daily Sharma Covert, MD   10 mg at 08/17/18 0742  . ARIPiprazole (ABILIFY) tablet 2 mg  2 mg Oral QHS Sharma Covert, MD   2 mg at 08/16/18 2106  . carvedilol (COREG) tablet 25 mg  25 mg Oral BID WC Sharma Covert, MD   25 mg at 08/17/18 6812  . hydrOXYzine (ATARAX/VISTARIL) tablet 25 mg  25 mg Oral TID PRN Derrill Center, NP      . ibuprofen (ADVIL,MOTRIN) tablet 400 mg  400 mg Oral Q6H PRN Connye Burkitt, NP      . lisinopril (PRINIVIL,ZESTRIL) tablet 5 mg  5 mg Oral Daily Sharma Covert, MD   5 mg at 08/17/18 0742  . magnesium hydroxide (MILK OF MAGNESIA) suspension 30 mL  30 mL Oral Daily PRN Derrill Center, NP      . metFORMIN (GLUCOPHAGE-XR) 24 hr tablet 500 mg  500 mg Oral Q breakfast Sharma Covert, MD   500 mg at 08/17/18 0742  . mirtazapine (REMERON) tablet 15 mg  15 mg Oral QHS Sharma Covert, MD   15 mg at 08/16/18 2106  . omega-3 acid ethyl esters (LOVAZA) capsule 1 g  1 g Oral BID Sharma Covert, MD   1 g at 08/17/18 [phone]  . traZODone (DESYREL) tablet 50 mg  50 mg Oral QHS PRN Sharma Covert, MD       PTA Medications: Medications Prior to Admission  Medication Sig Dispense Refill Last Dose  . amLODipine (NORVASC) 10 MG tablet Take 1 tablet (10 mg total) by mouth daily. 90 tablet 1 Past Month at Unknown time   . aspirin (EQ ASPIRIN ADULT LOW DOSE) 81 MG EC tablet Take 1 tablet (81 mg total) by mouth daily. Swallow whole. 90 tablet 0 Past Month at Unknown time  . carvedilol (COREG) 25 MG tablet Take 1 tablet (25 mg total) by mouth 2 (two) times daily with a meal. For high blood pressure 180 tablet 1 Past Month at Unknown time  . cetirizine (ZYRTEC) 10 MG tablet Take 1 tablet (10 mg total) by mouth daily. 30 tablet 11 Past Month at Unknown time  . fluticasone (FLONASE) 50 MCG/ACT nasal spray Place 1 spray into both nostrils daily. 16 g 0 Past Month at Unknown time  . ibuprofen (ADVIL,MOTRIN) 800 MG tablet Take 1 tablet (800 mg total) by mouth every 8 (eight) hours as needed for moderate pain. 30 tablet 3 Past Month at Unknown time  . metFORMIN (GLUCOPHAGE-XR) 500 MG 24 hr tablet Take 1 tablet (500 mg total) by mouth daily with breakfast. For diabetes management 90 tablet 1 Past Month at Unknown time  . omega-3 acid ethyl esters (LOVAZA) 1 g capsule Take 1 capsule (1 g total) by mouth 2 (two) times daily. For high cholesterol 180 capsule 1 Past Month at Unknown time  . ACCU-CHEK AVIVA PLUS test strip USE  UP TO 3 TIMES A DAY  11 Taking  . ACCU-CHEK FASTCLIX LANCETS MISC USE UP TO THREE TIMES A DAY  11 Taking  . Blood Glucose Monitoring Suppl (ACCU-CHEK AVIVA PLUS) w/Device KIT USE UP TO THREE TIMES A DAY  0 Taking  . ciclopirox (LOPROX) 0.77 % cream Apply topically 2 (two) times daily. 30 g 3 Taking  . co-enzyme Q-10 30 MG capsule Take 30 mg by mouth 3 (three) times daily.   Taking  . gabapentin (NEURONTIN) 100 MG capsule Take 1 capsule (100 mg total) by mouth 3 (three) times daily. For agitation/Neuropathic pain (Patient not taking: Reported on 07/26/2018) 90 capsule 0 Not Taking    Patient Stressors: Financial difficulties Health problems Medication change or noncompliance  Patient Strengths: Ability for insight Active sense of humor Capable of independent living Communication skills  Treatment  Modalities: Medication Management, Group therapy, Case management,  1 to 1 session with clinician, Psychoeducation, Recreational therapy.   Physician Treatment Plan for Primary Diagnosis: MDD (major depressive disorder), recurrent severe, without psychosis ([place]) Long Term Goal(s): Improvement in symptoms so as ready for discharge Improvement in symptoms so as ready for discharge   Short Term Goals: Ability to identify changes in lifestyle to reduce recurrence of condition will improve Ability to verbalize feelings will improve Ability to disclose and discuss suicidal ideas Ability to demonstrate self-control will improve Ability to identify and develop effective coping behaviors will improve Ability to maintain clinical measurements within normal limits will improve Compliance with prescribed medications will improve Ability to identify changes in lifestyle to reduce recurrence of condition will improve Ability to verbalize feelings will improve Ability to disclose and discuss suicidal ideas Ability to demonstrate self-control will improve Ability to identify and develop effective coping behaviors will improve Ability to maintain clinical measurements within normal limits will improve Compliance with prescribed medications will improve  Medication Management: Evaluate patient's response, side effects, and tolerance of medication regimen.  Therapeutic Interventions: 1 to 1 sessions, Unit Group sessions and Medication administration.  Evaluation of Outcomes: Not Met  Physician Treatment Plan for Secondary Diagnosis: Principal Problem:   MDD (major depressive disorder), recurrent severe, without psychosis ([place])  Long Term Goal(s): Improvement in symptoms so as ready for discharge Improvement in symptoms so as ready for discharge   Short Term Goals: Ability to identify changes in lifestyle to reduce recurrence of condition will improve Ability to verbalize feelings will  improve Ability to disclose and discuss suicidal ideas Ability to demonstrate self-control will improve Ability to identify and develop effective coping behaviors will improve Ability to maintain clinical measurements within normal limits will improve Compliance with prescribed medications will improve Ability to identify changes in lifestyle to reduce recurrence of condition will improve Ability to verbalize feelings will improve Ability to disclose and discuss suicidal ideas Ability to demonstrate self-control will improve Ability to identify and develop effective coping behaviors will improve Ability to maintain clinical measurements within normal limits will improve Compliance with prescribed medications will improve     Medication Management: Evaluate patient's response, side effects, and tolerance of medication regimen.  Therapeutic Interventions: 1 to 1 sessions, Unit Group sessions and Medication administration.  Evaluation of Outcomes: Not Met   RN Treatment Plan for Primary Diagnosis: MDD (major depressive disorder), recurrent severe, without psychosis ([place]) Long Term Goal(s): Knowledge of disease and therapeutic regimen to maintain health will improve  Short Term Goals: Ability to participate in decision making will improve, Ability to verbalize feelings will improve, Ability  to disclose and discuss suicidal ideas and Ability to identify and develop effective coping behaviors will improve  Medication Management: RN will administer medications as ordered by provider, will assess and evaluate patient's response and provide education to patient for prescribed medication. RN will report any adverse and/or side effects to prescribing provider.  Therapeutic Interventions: 1 on 1 counseling sessions, Psychoeducation, Medication administration, Evaluate responses to treatment, Monitor vital signs and CBGs as ordered, Perform/monitor CIWA, COWS, AIMS and Fall Risk screenings as ordered,  Perform wound care treatments as ordered.  Evaluation of Outcomes: Not Met   LCSW Treatment Plan for Primary Diagnosis: MDD (major depressive disorder), recurrent severe, without psychosis ([place]) Long Term Goal(s): Safe transition to appropriate next level of care at discharge, Engage patient in therapeutic group addressing interpersonal concerns.  Short Term Goals: Engage patient in aftercare planning with referrals and resources, Increase social support, Increase emotional regulation, Identify triggers associated with mental health/substance abuse issues and Increase skills for wellness and recovery  Therapeutic Interventions: Assess for all discharge needs, 1 to 1 time with Social worker, Explore available resources and support systems, Assess for adequacy in community support network, Educate family and significant other(s) on suicide prevention, Complete Psychosocial Assessment, Interpersonal group therapy.  Evaluation of Outcomes: Not Met   Progress in Treatment: Attending groups: No. Participating in groups: No. Taking medication as prescribed: Yes. Toleration medication: Yes. Family/Significant other contact made: No, will contact:  patient declines consents. SPE completed with patient. Patient understands diagnosis: Yes. Discussing patient identified problems/goals with staff: Yes. Medical problems stabilized or resolved: Yes. Denies suicidal/homicidal ideation: No. Issues/concerns per patient self-inventory: No.  New problem(s) identified: Yes, Describe:  recently unemployed, may have financial barriers to outpaitent follow up  New Short Term/Long Term Goal(s): medication management for mood stabilization; elimination of SI thoughts; development of comprehensive mental wellness/sobriety plan.  Patient Goals: "Feel well enough to go home. I need to reset."  Discharge Plan or Barriers: CSW continuing to assess. Current with [facility] for outpatient and attends a weekly support  group.   Reason for Continuation of Hospitalization: Anxiety Depression Suicidal ideation  Estimated Length of Stay: 3-5 days  Attendees: Patient: [name] 08/17/2018 11:16 AM  Physician:  08/17/2018 11:16 AM  Nursing:  08/17/2018 11:16 AM  RN Care Manager: 08/17/2018 11:16 AM  Social Worker: Stephanie Acre, [place] 08/17/2018 11:16 AM  Recreational Therapist:  08/17/2018 11:16 AM  Other:  08/17/2018 11:16 AM  Other:  08/17/2018 11:16 AM  Other: 08/17/2018 11:16 AM    Scribe for Treatment Team: Joellen [place], [place] 08/17/2018 11:16 AM

## 2018-08-17 NOTE — Progress Notes (Signed)
Patient denies SI, HI and AVH this shift.  Patient has been compliant with medications, attended groups and engaged in activities on the unit.  Patient had no issues of behavioral dyscontrol.   Asses patient for safety, offer medications as prescribed, engage patient in 1:1 staff talks.   Patient able to contract for safety.  Continue to monitor as planned 

## 2018-08-17 NOTE — [facility] Group Notes (Signed)
[place] Group Notes:  Nursing Psychoeducational Group  Date:  08/17/2018  Time:  4:00 PM  Type of Therapy:  Psychoeducational Skills  Participation Level:  Active  Participation Quality:  Appropriate, Attentive, Sharing and Supportive  Affect:  Blunted and Tearful  Cognitive:  Alert, Appropriate and Oriented  Insight:  Appropriate and Improving  Engagement in Group:  Developing/Improving, Engaged and Supportive  Modes of Intervention:  Discussion, Socialization and Support  Summary of Progress/Problems: Patient discussed he is anxious about going back to school and taking a motorcycle safety course. Patient discussed worst and best case scenarios of this situation and discussed his anxiety with group.   [place] 08/17/2018, 5:30 PM

## 2018-08-18 LAB — GLUCOSE, CAPILLARY: GLUCOSE-CAPILLARY: 147 mg/dL — AB (ref 70–99)

## 2018-08-18 MED ORDER — LISINOPRIL 5 MG PO TABS
5.0000 mg | ORAL_TABLET | Freq: Once | ORAL | Status: AC
  Administered 2018-08-18: 5 mg via ORAL
  Filled 2018-08-18 (×2): qty 1

## 2018-08-18 MED ORDER — ASPIRIN EC 81 MG PO TBEC
81.0000 mg | DELAYED_RELEASE_TABLET | Freq: Every day | ORAL | Status: DC
  Administered 2018-08-18 – 2018-08-19 (×2): 81 mg via ORAL
  Filled 2018-08-18 (×5): qty 1

## 2018-08-18 MED ORDER — LISINOPRIL 10 MG PO TABS
10.0000 mg | ORAL_TABLET | Freq: Every day | ORAL | Status: DC
  Filled 2018-08-18 (×3): qty 1

## 2018-08-18 NOTE — Progress Notes (Signed)
D: Patient observed to be more visible this evening than last. Reports continued improvement in mood and hopes for discharge tomorrow. Patient's affect flat, mood depressed but both improve with interaction. Denies pain, physical complaints.   A: Medicated per orders, no prns requested or required. Medication education provided. Level III obs in place for safety. Emotional support offered. Patient encouraged to complete Suicide Safety Plan before discharge. Encouraged to attend and participate in unit programming.   R: Patient verbalizes understanding of POC. Patient denies SI/HI/AVH and remains safe on level III obs. Will continue to monitor throughout the night.

## 2018-08-18 NOTE — Progress Notes (Signed)
D: Patient observed resting in bed where he admits "I've been most of the day, but I did attend 2 groups." Patient states he is beginning to feel better and reports being surprised but grateful that he is improving. Patient's affect flat but brightens with interaction. Mood depressed but pleasant and cooperative. Denies pain, physical complaints.   A: Medicated per orders,no prns given or requested. Medication education provided. Level III obs in place for safety. Emotional support offered. Patient encouraged to complete Suicide Safety Plan before discharge. Encouraged to attend and participate in unit programming.   R: Patient verbalizes understanding of POC. Patient denies SI/HI/AVH and remains safe on level III obs. Will continue to monitor throughout the night.

## 2018-08-18 NOTE — Progress Notes (Addendum)
[facility] MD Progress Note  08/18/2018 1:18 PM [name]  MRN:  [id number] Subjective:  "I'm doing better."  Mr. Brink found sitting in his room reading a book. He presents with brighter, more reactive affect today. He has been out of bed and visible in the milieu, participating in group therapy. He reports improved mood today. He states movement helps and identifies exercise as an effective coping skill. Denies medication side effects. Morning CBG 147. Blood pressure remains elevated today (144/112) with compliance with amlodipine 10 mg daily, Coreg 25 mg BID and lisinopril 5 mg daily. Hospitalist consulted and recommended increase of lisinopril to 10 mg daily. Patient denies SI/HI/AVH and states desire for discharge tomorrow. No agitated or disruptive behaviors on the unit.   From admission H&P: Patient is a 58 year old male with a past psychiatric history significant for major depression as well as alcohol dependence in remission who presented as a walk-in patient to the [facility] for an evaluation. The patient stated that he had been off his medications for approximately 3 months. He stopped these after he had been feeling better. He stated that recently his depressive symptoms have gotten worse. He was isolating, having anhedonia, hopelessness, guilt, and fatigue. He stated he was unable to get out of bed over the last 2 weeks. He admitted to suicidal ideation. His past attempts included intentionally driving a car off the bridge and an overdose of medications. 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 He has a past psychiatric history significant for status post cardial infarction, diabetes mellitus type 2, hypertension and hyperlipidemia. The patient's last psychiatric hospitalization was in April 2019. He was discharged on Abilify, Remeron. He stated he has been [facility] basis, but has not seen a psychiatrist since his discharge from our facility. He stated he had been sober since May 7 of last year.   Principal Problem: MDD (major depressive disorder), recurrent severe, without psychosis ([place]) Diagnosis: Principal Problem:   MDD (major depressive disorder), recurrent severe, without psychosis ([place])  Total Time spent with patient: 15 minutes  Past Psychiatric History: See admission H&P  Past Medical History:  Past Medical History:  Diagnosis Date  . Allergies   . Arthritis   . Chronic lower back pain   . Coronary artery disease    a. Multiple prior caths/PCI. Cath 2013 with possible spasm of RCA, 70% ISR of mid LCx with subsequent DES to mLCx and prox LCX. b. H/o microvascular angina. c. Recurrent angina 08/2014 - s/p PTCA/DES to prox Cx, PTCA/CBA to OM1.  c. LHC 06/10/15 with patent stents and some ISR in LCX and OM-1 that was not flow limiting --> Rx   . Dyslipidemia    a. Intolerant to many statins except tolerating Livalo.  Marland Kitchen GERD (gastroesophageal reflux disease)   . Hypertension   . Myocardial infarction ([place]) ~ 2010  . S/P angioplasty with stent, DES, to proximal and mid LCX 12/15/11 12/15/2011  . Shoulder pain   . Type II diabetes mellitus ([place])     Past Surgical History:  Procedure Laterality Date  . CARDIAC CATHETERIZATION  06/15/2002   LAD with prox 40% stenosis, norma L main, Cfx with 25% lesion, RCA with long mid 25% stenosis (Dr. Vita Barley)  . CARDIAC CATHETERIZATION  04/01/2010   normal L main, LAD wit mild stenosis, L Cfx with 70%  in-stent restenosis, RCA with 70% in-stent restenosis, LVEF >60% (Dr. K. Mali Hilty) - cutting ballon arthrectomy to RCA & Cfx (Dr. Loni Muse. Little)  . CARDIAC CATHETERIZATION  08/25/2010   preserved global LV contractility; multivessel CAD, diffuse 90-95% in-stent restenosis in prox placed Cfx stent - cutting balloon arthrectomy in Cfx with multiple dilatations 90-95% to 0% stenosis (Dr. Corky Downs)   . CARDIAC CATHETERIZATION  01/26/2011   PCI & stenting of aggresive in-stent restenosis within previously stented AV groove Cfx with 3.0x75mm Taxus DES (previous stents were Promus) (Dr. Adora Fridge)  . CARDIAC CATHETERIZATION  05/11/2011   preserved LV function, 40% mid LAD stenosis, 30-40% narrowing proximal to stented semgnet of prox Cfx, patent mid RCA stent with smooth 20% narrowing in distal RCA (Dr. Corky Downs)  . CARDIAC CATHETERIZATION  12/15/2011   PCI & stenting of proximal & mid Cfx with DES - 3.0x69mm in proximal, 3.0x82mm in mid (Dr. Adora Fridge)  . CARDIAC CATHETERIZATION N/A 06/10/2015   Procedure: Left Heart Cath and Coronary Angiography;  Surgeon: Jolaine Artist, MD;  Location: [facility];  Service: Cardiovascular;  Laterality: N/A;  . cardiometabolic testing  2/64/1583   good exercise effort, peak VO2 79% predicted with normal VO2 HR curves (mild deconditioning)  . COLONOSCOPY  12/2012   diminutive hyperplastic sigmoid poyp so repeat routine 2024  . EXCISIONAL HEMORRHOIDECTOMY  1984  . LEFT HEART CATHETERIZATION WITH CORONARY ANGIOGRAM N/A 05/11/2011   Procedure: LEFT HEART CATHETERIZATION WITH CORONARY ANGIOGRAM;  Surgeon: Troy Sine, MD;  Location: [facility] CATH LAB;  Service: Cardiovascular;  Laterality: N/A;  Possible percutaneous coronary intervention, possible IVUS  . LEFT HEART CATHETERIZATION WITH CORONARY ANGIOGRAM N/A 12/15/2011   Procedure: LEFT HEART CATHETERIZATION WITH CORONARY ANGIOGRAM;  Surgeon: Lorretta Harp, MD;  Location: [facility] CATH LAB;  Service: Cardiovascular;  Laterality: N/A;  . LEFT HEART CATHETERIZATION WITH CORONARY ANGIOGRAM N/A 09/05/2014   Procedure: LEFT HEART CATHETERIZATION WITH CORONARY ANGIOGRAM;  Surgeon: Burnell Blanks, MD;  Location: [facility] CATH LAB;  Service: Cardiovascular;  Laterality: N/A;  . LIPOMA EXCISION     back of the head  . NM MYOCAR PERF WALL MOTION  02/2012   lexiscan myoview; mild perfusion defect in mid inferolateral &  basal inferolateral region (infarct/scar); EF 52%, abnormal but ow risk scan  . PERCUTANEOUS CORONARY STENT INTERVENTION (PCI-S)  09/05/2014   Procedure: PERCUTANEOUS CORONARY STENT INTERVENTION (PCI-S);  Surgeon: Burnell Blanks, MD;  Location: [facility] CATH LAB;  Service: Cardiovascular;;   Family History:  Family History  Problem Relation Age of Onset  . Leukemia Mother   . Prostate cancer Father   . Coronary artery disease Paternal Grandmother   . Cancer Paternal Grandfather   . Cancer Brother    Family Psychiatric  History: See admission H&P Social History:  Social History   Substance and Sexual Activity  Alcohol Use Yes     Social History   Substance and Sexual Activity  Drug Use No   Comment: 12/15/11 "last cocaine was 2010"    Social History   Socioeconomic History  . Marital status: Divorced    Spouse name: Not on file  . Number of children: 2  . Years of education: GED  . Highest education level: Not on file  Occupational History  . Not on file  Social Needs  . Financial resource strain: Not on file  . Food insecurity:    Worry: Not on file    Inability: Not on file  .  Transportation needs:    Medical: Not on file    Non-medical: Not on file  Tobacco Use  . Smoking status: Former Smoker    Packs/day: 1.00    Years: 10.00    Pack years: 10.00    Types: Cigarettes    Last attempt to quit: 04/07/2009    Years since quitting: 9.3  . Smokeless tobacco: Never Used  Substance and Sexual Activity  . Alcohol use: Yes  . Drug use: No    Comment: 12/15/11 "last cocaine was 2010"  . Sexual activity: Not Currently  Lifestyle  . Physical activity:    Days per week: Not on file    Minutes per session: Not on file  . Stress: Not on file  Relationships  . Social connections:    Talks on phone: Not on file    Gets together: Not on file    Attends religious service: Not on file    Active member of club or organization: Not on file    Attends meetings of clubs  or organizations: Not on file    Relationship status: Not on file  Other Topics Concern  . Not on file  Social History Narrative  . Not on file   Additional Social History:    Pain Medications: pt denies abuse - see pta meds list Prescriptions: pt denies abuse - see pta meds list Over the Counter: pt denies abuse - see pta meds list History of alcohol / drug use?: Yes Longest period of sobriety (when/how long): pt states about 8 years Negative Consequences of Use: Personal relationships                    Sleep: Good  Appetite:  Good  Current Medications: Current Facility-Administered Medications  Medication Dose Route Frequency Provider Last Rate Last Dose  . alum & mag hydroxide-simeth (MAALOX/MYLANTA) 200-200-20 MG/5ML suspension 30 mL  30 mL Oral Q4H PRN Derrill Center, NP      . amLODipine (NORVASC) tablet 10 mg  10 mg Oral Daily Sharma Covert, MD   10 mg at 08/18/18 0754  . ARIPiprazole (ABILIFY) tablet 2 mg  2 mg Oral QHS Sharma Covert, MD   2 mg at 08/17/18 2135  . aspirin EC tablet 81 mg  81 mg Oral Daily , Myer Peer, MD   81 mg at 08/18/18 1155  . carvedilol (COREG) tablet 25 mg  25 mg Oral BID WC Sharma Covert, MD   25 mg at 08/18/18 0754  . hydrOXYzine (ATARAX/VISTARIL) tablet 25 mg  25 mg Oral TID PRN Derrill Center, NP      . ibuprofen (ADVIL,MOTRIN) tablet 400 mg  400 mg Oral Q6H PRN Connye Burkitt, NP   400 mg at 08/17/18 1121  . [START ON 08/19/2018] lisinopril (PRINIVIL,ZESTRIL) tablet 10 mg  10 mg Oral Daily ,  A, MD      . lisinopril (PRINIVIL,ZESTRIL) tablet 5 mg  5 mg Oral Once Connye Burkitt, NP      . magnesium hydroxide (MILK OF MAGNESIA) suspension 30 mL  30 mL Oral Daily PRN Derrill Center, NP      . metFORMIN (GLUCOPHAGE-XR) 24 hr tablet 500 mg  500 mg Oral Q breakfast Sharma Covert, MD   500 mg at 08/18/18 0754  . mirtazapine (REMERON) tablet 15 mg  15 mg Oral QHS Sharma Covert, MD   15 mg at  08/17/18 2135  . omega-3 acid ethyl esters (  LOVAZA) capsule 1 g  1 g Oral BID Sharma Covert, MD   1 g at 08/18/18 0754  . traZODone (DESYREL) tablet 50 mg  50 mg Oral QHS PRN Sharma Covert, MD        Lab Results:  Results for orders placed or performed during the hospital encounter of 08/15/18 (from the past 48 hour(s))  Glucose, capillary     Status: Abnormal   Collection Time: 08/17/18  6:05 AM  Result Value Ref Range   Glucose-Capillary 145 (H) 70 - 99 mg/dL   Comment 1 Notify RN    Comment 2 Document in Chart   Glucose, capillary     Status: Abnormal   Collection Time: 08/18/18  5:57 AM  Result Value Ref Range   Glucose-Capillary 147 (H) 70 - 99 mg/dL   Comment 1 Notify RN    Comment 2 Document in Chart     Blood Alcohol level:  Lab Results  Component Value Date   ETH <10 06/21/2017   ETH <5 26/94/8546    Metabolic Disorder Labs: Lab Results  Component Value Date   HGBA1C 5.8 (A) 07/15/2018   MPG 125.5 10/03/2017   MPG 128.37 06/25/2017   No results found for: PROLACTIN Lab Results  Component Value Date   CHOL 144 10/03/2017   TRIG 157 (H) 10/03/2017   HDL 51 10/03/2017   CHOLHDL 2.8 10/03/2017   VLDL 31 10/03/2017   LDLCALC 62 10/03/2017   LDLCALC 85 07/28/2017    Physical Findings: AIMS: Facial and Oral Movements Muscles of Facial Expression: None, normal Lips and Perioral Area: None, normal Jaw: None, normal Tongue: None, normal,Extremity Movements Upper (arms, wrists, hands, fingers): None, normal Lower (legs, knees, ankles, toes): None, normal, Trunk Movements Neck, shoulders, hips: None, normal, Overall Severity Severity of abnormal movements (highest score from questions above): None, normal Incapacitation due to abnormal movements: None, normal Patient's awareness of abnormal movements (rate only patient's report): No Awareness, Dental Status Current problems with teeth and/or dentures?: No Does patient usually wear dentures?: No   CIWA:    COWS:     Musculoskeletal: Strength & Muscle Tone: within normal limits Gait & Station: normal Patient leans: N/A  Psychiatric Specialty Exam: Physical Exam  Nursing note and vitals reviewed. Constitutional: He is oriented to person, place, and time. He appears well-developed and well-nourished.  Cardiovascular: Normal rate.  Respiratory: Effort normal.  Neurological: He is alert and oriented to person, place, and time.    Review of Systems  Constitutional: Negative.   Respiratory: Negative for shortness of breath.   Cardiovascular: Negative for chest pain.  Neurological: Negative for headaches.  Psychiatric/Behavioral: Positive for depression (improving). Negative for hallucinations, memory loss, substance abuse and suicidal ideas. The patient is not nervous/anxious and does not have insomnia.     Blood pressure (!) 144/112, pulse 71, temperature 98 F (36.7 C), resp. rate 16, height 5\' 8"  (1.727 m), weight 85.3 kg, SpO2 98 %.Body mass index is 28.59 kg/m.  General Appearance: Casual  Eye Contact:  Good  Speech:  Clear and Coherent and Normal Rate  Volume:  Normal  Mood:  Depressed  Affect:  Appropriate and Full Range  Thought Process:  Coherent  Orientation:  Full (Time, Place, and Person)  Thought Content:  WDL  Suicidal Thoughts:  No  Homicidal Thoughts:  No  Memory:  Immediate;   Good Recent;   Good  Judgement:  Good  Insight:  Fair  Psychomotor Activity:  Normal  Concentration:  Concentration: Good  Recall:  Good  Fund of Knowledge:  Good  Language:  Good  Akathisia:  No  Handed:  Right  AIMS (if indicated):     Assets:  Communication Skills Desire for Improvement Housing Leisure Time Resilience  ADL's:  Intact  Cognition:  WNL  Sleep:  Number of Hours: 5.5     Treatment Plan Summary: Daily contact with patient to assess and evaluate symptoms and progress in treatment and Medication management   Continue inpatient  hospitalization.  Increase lisinopril to 10 mg PO daily for HTN, per hospitalist recommendation Continue ibuprofen 400 mg PO Q6HR PRN pain Continue Remeron 15 mg PO QHS for mood Continue Abilify 2 mg PO QHS for mood Continue Vistaril 25 mg PO TID PRN anxiety Continue trazodone 50 mg PO QHS PRN insomnia Continue Norvasc 10 mg PO daily for HTN Continue Coreg 25 mg PO BID for HTN Continue metformin 500 mg PO daily for diabetes  Patient will participate in the therapeutic group milieu.  Discharge disposition in progress.   Connye Burkitt, NP 08/18/2018, 1:18 PM   Agree with NP progress note

## 2018-08-18 NOTE — Plan of Care (Signed)
  Problem: Education: Goal: Emotional status will improve Outcome: Progressing  DAR NOTE: Patient presents with flat affect and depressed mood.  Denies suicidal thoughts, auditory and visual hallucinations.  Described energy level as hyper and concentration as good.  Rates depression at 3, hopelessness at 2, and anxiety at 1.  Maintained on routine safety checks.  Medications given as prescribed.  Support and encouragement offered as needed.  Attended group and participated.  States goal for today is "discharge plan."  Patient visible in milieu with minimal interaction.  Patient is safe on and off the unit.

## 2018-08-18 NOTE — BHH Group Notes (Signed)
[facility] Mental [facility] Association Group Therapy 08/18/2018 11:24 AM  Type of Therapy: Mental Health Association Presentation  Participation Level: Active  Participation Quality: Attentive  Affect: Appropriate  Cognitive: Oriented  Insight: Developing/Improving  Engagement in Therapy: Engaged  Modes of Intervention: Discussion, Education and Socialization   Summary of Progress/Problems: Kenneth ([place]) Speaker came to talk about his personal journey with mental health. The pt processed ways by which to relate to the speaker. [place] speaker provided handouts and educational information pertaining to groups and services offered by the [facility]. Pt was engaged in speaker's presentation and was receptive to resources provided.   [name], MSW, [facility] 08/18/2018 11:24 AM

## 2018-08-19 LAB — GLUCOSE, CAPILLARY: Glucose-Capillary: 159 mg/dL — ABNORMAL HIGH (ref 70–99)

## 2018-08-19 MED ORDER — ARIPIPRAZOLE 2 MG PO TABS: 2.0000 mg | ORAL_TABLET | Freq: Every day | ORAL | 0 refills | Status: DC

## 2018-08-19 MED ORDER — MIRTAZAPINE 15 MG PO TABS: 15.0000 mg | ORAL_TABLET | Freq: Every day | ORAL | 0 refills | Status: DC

## 2018-08-19 MED ORDER — LISINOPRIL 10 MG PO TABS: 10.0000 mg | ORAL_TABLET | Freq: Every day | ORAL | 0 refills | Status: DC

## 2018-08-19 NOTE — Progress Notes (Signed)
  [facility] Adult Case Management Discharge Plan :  Will you be returning to the same living situation after discharge:  Yes,  patient reports he is returning home, alone.  At discharge, do you have transportation home?: Yes,  taxi voucher Do you have the ability to pay for your medications: Yes,  Medicaid, disbaility income  Release of information consent forms completed and in the chart;  Patient's signature needed at discharge.  Patient to Follow up at: Follow-up Information    [facility] Follow up on 08/29/2018.   Why:  Your next therapy appointment with Magda Paganini is Monday 3/2 at 1:45p.  Contact information: [place] [address] [phone]        [facility] Follow up.   Why:  Low-income/sliding scale clinic for primary care. Please call to schedule appointment for follow up on blood pressure. Contact information: [address] [place] [address] [phone]          Next level of care provider has access to [place] and Suicide Prevention discussed: Yes,  with the patient  Have you used any form of tobacco in the last 30 days? (Cigarettes, Smokeless Tobacco, Cigars, and/or Pipes): No  Has patient been referred to the Quitline?: N/A patient is not a smoker  Patient has been referred for addiction treatment: Pt. refused referral  [name], [place] 08/19/2018, 11:20 AM

## 2018-08-19 NOTE — Discharge Summary (Addendum)
Physician Discharge Summary Note  Patient:  [name] is an 58 y.o., male MRN:  [id number] DOB:  07/27/1960 Patient phone:  [phone] (home)  Patient address:   [place] [address],  Total Time spent with patient: 15 minutes  Date of Admission:  08/15/2018 Date of Discharge: 08/19/2018  Reason for Admission:  Suicidal ideation  Principal Problem: MDD (major depressive disorder), recurrent severe, without psychosis ([place]) Discharge Diagnoses: Principal Problem:   MDD (major depressive disorder), recurrent severe, without psychosis ([place])   Past Psychiatric History: Per admission H&P: Patient stated that he had been psychiatrically hospitalized 2 times prior to this admission.  He had prior suicide attempt by driving off the road in the 90s and by overdosing in 2005.  He stated he had been in detox multiple times past.  Past Medical History:  Past Medical History:  Diagnosis Date  . Allergies   . Arthritis   . Chronic lower back pain   . Coronary artery disease    a. Multiple prior caths/PCI. Cath 2013 with possible spasm of RCA, 70% ISR of mid LCx with subsequent DES to mLCx and prox LCX. b. H/o microvascular angina. c. Recurrent angina 08/2014 - s/p PTCA/DES to prox Cx, PTCA/CBA to OM1.  c. LHC 06/10/15 with patent stents and some ISR in LCX and OM-1 that was not flow limiting --> Rx   . Dyslipidemia    a. Intolerant to many statins except tolerating Livalo.  Marland Kitchen GERD (gastroesophageal reflux disease)   . Hypertension   . Myocardial infarction ([place]) ~ 2010  . S/P angioplasty with stent, DES, to proximal and mid LCX 12/15/11 12/15/2011  . Shoulder pain   . Type II diabetes mellitus ([place])     Past Surgical History:  Procedure Laterality Date  . CARDIAC CATHETERIZATION  06/15/2002   LAD with prox 40% stenosis, norma L main, Cfx with 25% lesion, RCA with long mid 25% stenosis (Dr. Vita Barley)  . CARDIAC CATHETERIZATION  04/01/2010   normal L main, LAD wit  mild stenosis, L Cfx with 70% in-stent restenosis, RCA with 70% in-stent restenosis, LVEF >60% (Dr. K. Mali Hilty) - cutting ballon arthrectomy to RCA & Cfx (Dr. Rockne Menghini)  . CARDIAC CATHETERIZATION  08/25/2010   preserved global LV contractility; multivessel CAD, diffuse 90-95% in-stent restenosis in prox placed Cfx stent - cutting balloon arthrectomy in Cfx with multiple dilatations 90-95% to 0% stenosis (Dr. Corky Downs)  . CARDIAC CATHETERIZATION  01/26/2011   PCI & stenting of aggresive in-stent restenosis within previously stented AV groove Cfx with 3.0x6m Taxus DES (previous stents were Promus) (Dr. JAdora Fridge  . CARDIAC CATHETERIZATION  05/11/2011   preserved LV function, 40% mid LAD stenosis, 30-40% narrowing proximal to stented semgnet of prox Cfx, patent mid RCA stent with smooth 20% narrowing in distal RCA (Dr. TCorky Downs  . CARDIAC CATHETERIZATION  12/15/2011   PCI & stenting of proximal & mid Cfx with DES - 3.0x113min proximal, 3.0x1536mn mid (Dr. J. Adora Fridge. CARDIAC CATHETERIZATION N/A 06/10/2015   Procedure: Left Heart Cath and Coronary Angiography;  Surgeon: DanJolaine ArtistD;  Location: [facility];  Service: Cardiovascular;  Laterality: N/A;  . cardiometabolic testing  08/09/93/0932good exercise effort, peak VO2 79% predicted with normal VO2 HR curves (mild deconditioning)  . COLONOSCOPY  12/2012   diminutive hyperplastic sigmoid poyp so repeat routine 2024  . EXCISIONAL HEMORRHOIDECTOMY  1984  . LEFT HEART  CATHETERIZATION WITH CORONARY ANGIOGRAM N/A 05/11/2011   Procedure: LEFT HEART CATHETERIZATION WITH CORONARY ANGIOGRAM;  Surgeon: Troy Sine, MD;  Location: [facility] CATH LAB;  Service: Cardiovascular;  Laterality: N/A;  Possible percutaneous coronary intervention, possible IVUS  . LEFT HEART CATHETERIZATION WITH CORONARY ANGIOGRAM N/A 12/15/2011   Procedure: LEFT HEART CATHETERIZATION WITH CORONARY ANGIOGRAM;  Surgeon: Lorretta Harp, MD;  Location: [facility] CATH LAB;   Service: Cardiovascular;  Laterality: N/A;  . LEFT HEART CATHETERIZATION WITH CORONARY ANGIOGRAM N/A 09/05/2014   Procedure: LEFT HEART CATHETERIZATION WITH CORONARY ANGIOGRAM;  Surgeon: Burnell Blanks, MD;  Location: [facility] CATH LAB;  Service: Cardiovascular;  Laterality: N/A;  . LIPOMA EXCISION     back of the head  . NM MYOCAR PERF WALL MOTION  02/2012   lexiscan myoview; mild perfusion defect in mid inferolateral & basal inferolateral region (infarct/scar); EF 52%, abnormal but ow risk scan  . PERCUTANEOUS CORONARY STENT INTERVENTION (PCI-S)  09/05/2014   Procedure: PERCUTANEOUS CORONARY STENT INTERVENTION (PCI-S);  Surgeon: Burnell Blanks, MD;  Location: [facility] CATH LAB;  Service: Cardiovascular;;   Family History:  Family History  Problem Relation Age of Onset  . Leukemia Mother   . Prostate cancer Father   . Coronary artery disease Paternal Grandmother   . Cancer Paternal Grandfather   . Cancer Brother    Family Psychiatric  History: Per admission H&P: Both parents are deceased.  Mother died 2 years ago.  He has 3 brothers and 2 sisters.  History of alcohol dependence and depression and extended family.  He had an uncle who committed suicide.  His wife died several years ago, he has 2 adult children. Social History:  Social History   Substance and Sexual Activity  Alcohol Use Yes     Social History   Substance and Sexual Activity  Drug Use No   Comment: 12/15/11 "last cocaine was 2010"    Social History   Socioeconomic History  . Marital status: Divorced    Spouse name: Not on file  . Number of children: 2  . Years of education: GED  . Highest education level: Not on file  Occupational History  . Not on file  Social Needs  . Financial resource strain: Not on file  . Food insecurity:    Worry: Not on file    Inability: Not on file  . Transportation needs:    Medical: Not on file    Non-medical: Not on file  Tobacco Use  . Smoking status: Former Smoker     Packs/day: 1.00    Years: 10.00    Pack years: 10.00    Types: Cigarettes    Last attempt to quit: 04/07/2009    Years since quitting: 9.3  . Smokeless tobacco: Never Used  Substance and Sexual Activity  . Alcohol use: Yes  . Drug use: No    Comment: 12/15/11 "last cocaine was 2010"  . Sexual activity: Not Currently  Lifestyle  . Physical activity:    Days per week: Not on file    Minutes per session: Not on file  . Stress: Not on file  Relationships  . Social connections:    Talks on phone: Not on file    Gets together: Not on file    Attends religious service: Not on file    Active member of club or organization: Not on file    Attends meetings of clubs or organizations: Not on file    Relationship status: Not on file  Other  Topics Concern  . Not on file  Social History Narrative  . Not on file    Hospital Course:  Per admission H&P 08/15/2018: Patient is a 58 year old male with a past psychiatric history significant for major depression as well as alcohol dependence in remission who presented as a walk-in patient to the [facility] behavioral [facility] hospital for an evaluation. The patient stated that he had been off his medications for approximately 3 months. He stopped these after he had been feeling better. He stated that recently his depressive symptoms have gotten worse. He was isolating, having anhedonia, hopelessness, guilt, and fatigue. He stated he was unable to get out of bed over the last 2 weeks. He admitted to suicidal ideation. His past attempts included intentionally driving a car off the bridge and an overdose of medications. 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 He has a past psychiatric history significant for status post cardial infarction, diabetes mellitus type 2, hypertension and hyperlipidemia. The patient's last psychiatric hospitalization was in April 2019. He was discharged on  Abilify, Remeron. He stated he has been attending a support group [facility] on a weekly basis, but has not seen a psychiatrist since his discharge from our facility. He stated he had been sober since May 7 of last year.  Mr. Soffer was admitted for suicidal ideation. He had been off of psychiatric medications for several months. He was restarted on Abilify and Remeron. Medications for diabetes and HTN were continued. His blood pressures remained elevated with home bp meds, so lisinopril was added and increased to 10 mg daily. Mr. Ratledge remained on the [facility] unit for 5 days. He stabilized with medication and therapy. He was discharged on the medications listed below. He has shown improvement with improved mood, affect, sleep, appetite, and interaction. He denies any SI/HI/AVH and contracts for safety. He agrees to follow up at [facility] (see below). He is also instructed to follow up with primary care provider for blood pressure management. Patient is provided with prescriptions for medications upon discharge. He is discharging home via taxi.  Physical Findings: AIMS: Facial and Oral Movements Muscles of Facial Expression: None, normal Lips and Perioral Area: None, normal Jaw: None, normal Tongue: None, normal,Extremity Movements Upper (arms, wrists, hands, fingers): None, normal Lower (legs, knees, ankles, toes): None, normal, Trunk Movements Neck, shoulders, hips: None, normal, Overall Severity Severity of abnormal movements (highest score from questions above): None, normal Incapacitation due to abnormal movements: None, normal Patient's awareness of abnormal movements (rate only patient's report): No Awareness, Dental Status Current problems with teeth and/or dentures?: No Does patient usually wear dentures?: No  CIWA:    COWS:     Musculoskeletal: Strength & Muscle Tone: within normal limits Gait & Station: normal Patient leans: N/A  Psychiatric Specialty Exam: Physical Exam  Nursing  note and vitals reviewed. Constitutional: He is oriented to person, place, and time. He appears well-developed and well-nourished.  Cardiovascular: Normal rate.  Respiratory: Effort normal.  Neurological: He is alert and oriented to person, place, and time.    Review of Systems  Constitutional: Negative.   Psychiatric/Behavioral: Positive for depression (improving). Negative for hallucinations, memory loss, substance abuse and suicidal ideas. The patient is not nervous/anxious and does not have insomnia.     Blood pressure 123/85, pulse 81, temperature 98.4 F (36.9 C), temperature source Oral, resp. rate 16, height 5' 8" (1.727 m), weight 85.3  kg, SpO2 98 %.Body mass index is 28.59 kg/m.  See MD's discharge SRA     Have you used any form of tobacco in the last 30 days? (Cigarettes, Smokeless Tobacco, Cigars, and/or Pipes): No  Has this patient used any form of tobacco in the last 30 days? (Cigarettes, Smokeless Tobacco, Cigars, and/or Pipes)  No  Blood Alcohol level:  Lab Results  Component Value Date   ETH <10 06/21/2017   ETH <5 59/93/5701    Metabolic Disorder Labs:  Lab Results  Component Value Date   HGBA1C 5.8 (A) 07/15/2018   MPG 125.5 10/03/2017   MPG 128.37 06/25/2017   No results found for: PROLACTIN Lab Results  Component Value Date   CHOL 144 10/03/2017   TRIG 157 (H) 10/03/2017   HDL 51 10/03/2017   CHOLHDL 2.8 10/03/2017   VLDL 31 10/03/2017   LDLCALC 62 10/03/2017   LDLCALC 85 07/28/2017    See Psychiatric Specialty Exam and Suicide Risk Assessment completed by Attending Physician prior to discharge.  Discharge destination:  Home  Is patient on multiple antipsychotic therapies at discharge:  No   Has Patient had three or more failed trials of antipsychotic monotherapy by history:  No  Recommended Plan for Multiple Antipsychotic Therapies: NA  Discharge Instructions    Discharge instructions   Complete by:  As directed    Patient is instructed  to take all prescribed medications as recommended. Report any side effects or adverse reactions to your outpatient psychiatrist. Patient is instructed to abstain from alcohol and illegal drugs while on prescription medications. In the event of worsening symptoms, patient is instructed to call the crisis hotline, 911, or go to the nearest emergency department for evaluation and treatment.     Allergies as of 08/19/2018      Reactions   Bee Venom Anaphylaxis, Hives   Shellfish Allergy Anaphylaxis, Hives   Statins Other (See Comments)   Myalgias. Tolerating livalo.   Testosterone Cypionate    Testerone Injection --Increased breast tissue       Medication List    STOP taking these medications   ciclopirox 0.77 % cream Commonly known as:  LOPROX   co-enzyme Q-10 30 MG capsule   gabapentin 100 MG capsule Commonly known as:  NEURONTIN     TAKE these medications     Indication  ACCU-CHEK AVIVA PLUS test strip Generic drug:  glucose blood USE UP TO 3 TIMES A DAY  Indication:  Diabetes   ACCU-CHEK AVIVA PLUS w/Device Kit USE UP TO THREE TIMES A DAY  Indication:  Diabetes   ACCU-CHEK FASTCLIX LANCETS Misc USE UP TO THREE TIMES A DAY  Indication:  Diabetes   amLODipine 10 MG tablet Commonly known as:  NORVASC Take 1 tablet (10 mg total) by mouth daily.  Indication:  High Blood Pressure Disorder   ARIPiprazole 2 MG tablet Commonly known as:  ABILIFY Take 1 tablet (2 mg total) by mouth at bedtime. For mood  Indication:  Mood   aspirin 81 MG EC tablet Commonly known as:  EQ ASPIRIN ADULT LOW DOSE Take 1 tablet (81 mg total) by mouth daily. Swallow whole.  Indication:  Antiplatelet   carvedilol 25 MG tablet Commonly known as:  COREG Take 1 tablet (25 mg total) by mouth 2 (two) times daily with a meal. For high blood pressure  Indication:  Cardiac issues   cetirizine 10 MG tablet Commonly known as:  ZYRTEC Take 1 tablet (10 mg total) by mouth daily.  Indication:   Hayfever   fluticasone 50 MCG/ACT nasal spray Commonly known as:  FLONASE Place 1 spray into both nostrils daily.  Indication:  Allergic Rhinitis   ibuprofen 800 MG tablet Commonly known as:  ADVIL,MOTRIN Take 1 tablet (800 mg total) by mouth every 8 (eight) hours as needed for moderate pain.  Indication:  Pain   lisinopril 10 MG tablet Commonly known as:  PRINIVIL,ZESTRIL Take 1 tablet (10 mg total) by mouth daily. For high blood pressure Start taking on:  August 20, 2018  Indication:  High Blood Pressure Disorder   metFORMIN 500 MG 24 hr tablet Commonly known as:  GLUCOPHAGE-XR Take 1 tablet (500 mg total) by mouth daily with breakfast. For diabetes management  Indication:  Type 2 Diabetes   mirtazapine 15 MG tablet Commonly known as:  REMERON Take 1 tablet (15 mg total) by mouth at bedtime. For mood  Indication:  Mood   omega-3 acid ethyl esters 1 g capsule Commonly known as:  LOVAZA Take 1 capsule (1 g total) by mouth 2 (two) times daily. For high cholesterol  Indication:  High Amount of Triglycerides in the Blood      Follow-up Information    [facility] Follow up on 08/29/2018.   Why:  Your next therapy appointment with Magda Paganini is Monday 3/2 at 1:45p.  Contact information: [place] [address] [phone]        [facility] Follow up.   Why:  Low-income/sliding scale clinic for primary care. Please call to schedule appointment for follow up on blood pressure. Contact information: [address] [place] [address] [phone]          Follow-up recommendations: Follow up with primary care provider for blood pressure management. Activity as tolerated. Diet as recommended by primary care physician. Keep all scheduled follow-up appointments as recommended.   Comments:   Patient is instructed to take all prescribed medications as recommended. Report any side effects or adverse reactions to  your outpatient psychiatrist. Patient is instructed to abstain from alcohol and illegal drugs while on prescription medications. In the event of worsening symptoms, patient is instructed to call the crisis hotline, 911, or go to the nearest emergency department for evaluation and treatment.  Signed: Connye Burkitt, NP 08/19/2018, 10:54 AM   Patient seen, Suicide Assessment Completed.  Disposition Plan Reviewed

## 2018-08-19 NOTE — Progress Notes (Signed)
Pt received both written and verbal discharge instructions. Pt verbalized understanding of discharge instructions. Pt received AVS, SRA, transitional record and a suicide prevention worksheet. Pt gathered belongings from room and locker. Pt provided with a taxi voucher. Pt safely discharged to the lobby.

## 2018-08-19 NOTE — Progress Notes (Signed)
Adult Psychoeducational Group Note  Date:  08/19/2018 Time:  11:40 AM  Group Topic/Focus:  Goals Group:   The focus of this group is to help patients establish daily goals to achieve during treatment and discuss how the patient can incorporate goal setting into their daily lives to aide in recovery. Orientation:   The focus of this group is to educate the patient on the purpose and policies of crisis stabilization and provide a format to answer questions about their admission.  The group details unit policies and expectations of patients while admitted.  Participation Level:  Active  Participation Quality:  Appropriate  Affect:  Appropriate  Cognitive:  Alert  Insight: Appropriate  Engagement in Group:  Engaged  Modes of Intervention:  Discussion and Education  Additional Comments:    Pt participated in goals group. Pt's goal today is to work on preparing for discharge. Pt states he is ready to leave. What he plans to do when he gets home is clean up and get organized.   Lita Mains 08/19/2018, 11:40 AM

## 2018-08-19 NOTE — BHH Suicide Risk Assessment (Signed)
[facility] Discharge Suicide Risk Assessment   Principal Problem: MDD (major depressive disorder), recurrent severe, without psychosis ([place]) Discharge Diagnoses: Principal Problem:   MDD (major depressive disorder), recurrent severe, without psychosis ([place])   Total Time spent with patient: 30 minutes  Musculoskeletal: Strength & Muscle Tone: within normal limits Gait & Station: normal Patient leans: N/A  Psychiatric Specialty Exam: ROS patient denies headache, no chest pain, no shortness of breath, no vomiting, no fever, no chills  Blood pressure 123/85, pulse 81, temperature 98.4 F (36.9 C), temperature source Oral, resp. rate 16, height 5\' 8"  (1.727 m), weight 85.3 kg, SpO2 98 %.Body mass index is 28.59 kg/m.  General Appearance: Improved grooming  Eye Contact::  Good  Speech:  Normal Rate409  Volume:  Normal  Mood:  Reports improved mood and presents euthymic today, with a more reactive affect  Affect:  Appropriate and Full Range  Thought Process:  Linear and Descriptions of Associations: Intact  Orientation:  Full (Time, Place, and Person)  Thought Content:  No hallucinations, no delusions, not internally preoccupied  Suicidal Thoughts:  No-denies suicidal or self-injurious ideations, denies homicidal or violent ideations  Homicidal Thoughts:  No  Memory:  Recent and remote grossly intact  Judgement:  Other:  Improving  Insight:  Improving  Psychomotor Activity:  Normal-no restlessness or agitation  Concentration:  Good  Recall:  Good  Fund of Knowledge:Good  Language: Good  Akathisia:  Negative  Handed:  Right  AIMS (if indicated):     Assets:  Communication Skills Desire for Improvement Resilience  Sleep:  Number of Hours: 6.75  Cognition: WNL  ADL's:  Intact   Mental Status Per Nursing Assessment::   On Admission:  NA  Demographic Factors:  58 year old male, widowed, 2 adult children, lives alone, on disability  Loss Factors: Psychosocial stressors, limited  support network, disability, chronic pain  Historical Factors: History of depression, history of alcohol use disorder currently in remission History of prior psychiatric admissions Risk Reduction Factors:   Positive coping skills or problem solving skills  Continued Clinical Symptoms:  At this time patient is alert, attentive, well-groomed, good eye contact, mood is described as significantly improved, today presents euthymic, with a full range of affect, smiles and laughs appropriately during session today.  No thought disorder, no suicidal or homicidal ideations, currently future oriented.  No hallucinations, no delusions, oriented x3. Denies medication side effects at this time.  Blood pressure has been trending down.  Visible on unit, pleasant on approach.   Cognitive Features That Contribute To Risk:  No gross cognitive deficits noted upon discharge. Is alert , attentive, and oriented x 3  Suicide Risk:  Mild:  Suicidal ideation of limited frequency, intensity, duration, and specificity.  There are no identifiable plans, no associated intent, mild dysphoria and related symptoms, good self-control (both objective and subjective assessment), few other risk factors, and identifiable protective factors, including available and accessible social support.  Follow-up Information    [facility] Follow up on 08/29/2018.   Why:  Your next therapy appointment with Magda Paganini is Monday 3/2 at 1:45p.  Contact information: [address] [place] [address] [phone]           Plan Of Care/Follow-up recommendations:  Activity:  As tolerated Diet:  Diabetic, heart healthy diet Tests:  NA Other:  See below  Patient is expressing significant improvement compared to admission and readiness for discharge.  There are no current grounds for involuntary commitment and is leaving unit in good spirits  at this time.  He plans to return home.  Plans to follow-up as above.  Also plans to follow-up with  his primary care doctor at [facility] for further medical management as needed.   Jenne Campus, MD 08/19/2018, 10:41 AM

## 2018-08-19 NOTE — Tx Team (Addendum)
Interdisciplinary Treatment and Diagnostic Plan Update  08/19/2018 Time of Session: 10:30am [name] MRN: [id number]  Principal Diagnosis: MDD (major depressive disorder), recurrent severe, without psychosis ([name])  Secondary Diagnoses: Principal Problem:   MDD (major depressive disorder), recurrent severe, without psychosis ([name])   Current Medications:  Current Facility-Administered Medications  Medication Dose Route Frequency Provider Last Rate Last Dose  . alum & mag hydroxide-simeth (MAALOX/MYLANTA) 200-200-20 MG/5ML suspension 30 mL  30 mL Oral Q4H PRN Derrill Center, NP      . amLODipine (NORVASC) tablet 10 mg  10 mg Oral Daily Sharma Covert, MD   10 mg at 08/19/18 0802  . ARIPiprazole (ABILIFY) tablet 2 mg  2 mg Oral QHS Sharma Covert, MD   2 mg at 08/18/18 2103  . aspirin EC tablet 81 mg  81 mg Oral Daily Cobos, Myer Peer, MD   81 mg at 08/19/18 0802  . carvedilol (COREG) tablet 25 mg  25 mg Oral BID WC Sharma Covert, MD   25 mg at 08/19/18 0802  . hydrOXYzine (ATARAX/VISTARIL) tablet 25 mg  25 mg Oral TID PRN Derrill Center, NP      . ibuprofen (ADVIL,MOTRIN) tablet 400 mg  400 mg Oral Q6H PRN Connye Burkitt, NP   400 mg at 08/19/18 0802  . lisinopril (PRINIVIL,ZESTRIL) tablet 10 mg  10 mg Oral Daily Cobos, Fernando A, MD      . magnesium hydroxide (MILK OF MAGNESIA) suspension 30 mL  30 mL Oral Daily PRN Derrill Center, NP      . metFORMIN (GLUCOPHAGE-XR) 24 hr tablet 500 mg  500 mg Oral Q breakfast Sharma Covert, MD   500 mg at 08/19/18 0802  . mirtazapine (REMERON) tablet 15 mg  15 mg Oral QHS Sharma Covert, MD   15 mg at 08/18/18 2103  . omega-3 acid ethyl esters (LOVAZA) capsule 1 g  1 g Oral BID Sharma Covert, MD   1 g at 08/19/18 0802  . traZODone (DESYREL) tablet 50 mg  50 mg Oral QHS PRN Sharma Covert, MD       PTA Medications: Medications Prior to Admission  Medication Sig Dispense Refill Last Dose  . amLODipine (NORVASC)  10 MG tablet Take 1 tablet (10 mg total) by mouth daily. 90 tablet 1 Past Month at Unknown time  . aspirin (EQ ASPIRIN ADULT LOW DOSE) 81 MG EC tablet Take 1 tablet (81 mg total) by mouth daily. Swallow whole. 90 tablet 0 Past Month at Unknown time  . carvedilol (COREG) 25 MG tablet Take 1 tablet (25 mg total) by mouth 2 (two) times daily with a meal. For high blood pressure 180 tablet 1 Past Month at Unknown time  . cetirizine (ZYRTEC) 10 MG tablet Take 1 tablet (10 mg total) by mouth daily. 30 tablet 11 Past Month at Unknown time  . fluticasone (FLONASE) 50 MCG/ACT nasal spray Place 1 spray into both nostrils daily. 16 g 0 Past Month at Unknown time  . ibuprofen (ADVIL,MOTRIN) 800 MG tablet Take 1 tablet (800 mg total) by mouth every 8 (eight) hours as needed for moderate pain. 30 tablet 3 Past Month at Unknown time  . metFORMIN (GLUCOPHAGE-XR) 500 MG 24 hr tablet Take 1 tablet (500 mg total) by mouth daily with breakfast. For diabetes management 90 tablet 1 Past Month at Unknown time  . omega-3 acid ethyl esters (LOVAZA) 1 g capsule Take 1 capsule (1 g total) by mouth  2 (two) times daily. For high cholesterol 180 capsule 1 Past Month at Unknown time  . ACCU-CHEK AVIVA PLUS test strip USE UP TO 3 TIMES A DAY  11 Taking  . ACCU-CHEK FASTCLIX LANCETS MISC USE UP TO THREE TIMES A DAY  11 Taking  . Blood Glucose Monitoring Suppl (ACCU-CHEK AVIVA PLUS) w/Device KIT USE UP TO THREE TIMES A DAY  0 Taking  . ciclopirox (LOPROX) 0.77 % cream Apply topically 2 (two) times daily. 30 g 3 Taking  . co-enzyme Q-10 30 MG capsule Take 30 mg by mouth 3 (three) times daily.   Taking  . gabapentin (NEURONTIN) 100 MG capsule Take 1 capsule (100 mg total) by mouth 3 (three) times daily. For agitation/Neuropathic pain (Patient not taking: Reported on 07/26/2018) 90 capsule 0 Not Taking    Patient Stressors: Financial difficulties Health problems Medication change or noncompliance  Patient Strengths: Ability for  insight Active sense of humor Capable of independent living Communication skills  Treatment Modalities: Medication Management, Group therapy, Case management,  1 to 1 session with clinician, Psychoeducation, Recreational therapy.   Physician Treatment Plan for Primary Diagnosis: MDD (major depressive disorder), recurrent severe, without psychosis ([name]) Long Term Goal(s): Improvement in symptoms so as ready for discharge Improvement in symptoms so as ready for discharge   Short Term Goals: Ability to identify changes in lifestyle to reduce recurrence of condition will improve Ability to verbalize feelings will improve Ability to disclose and discuss suicidal ideas Ability to demonstrate self-control will improve Ability to identify and develop effective coping behaviors will improve Ability to maintain clinical measurements within normal limits will improve Compliance with prescribed medications will improve Ability to identify changes in lifestyle to reduce recurrence of condition will improve Ability to verbalize feelings will improve Ability to disclose and discuss suicidal ideas Ability to demonstrate self-control will improve Ability to identify and develop effective coping behaviors will improve Ability to maintain clinical measurements within normal limits will improve Compliance with prescribed medications will improve  Medication Management: Evaluate patient's response, side effects, and tolerance of medication regimen.  Therapeutic Interventions: 1 to 1 sessions, Unit Group sessions and Medication administration.  Evaluation of Outcomes: Adequate for Discharge  Physician Treatment Plan for Secondary Diagnosis: Principal Problem:   MDD (major depressive disorder), recurrent severe, without psychosis ([name])  Long Term Goal(s): Improvement in symptoms so as ready for discharge Improvement in symptoms so as ready for discharge   Short Term Goals: Ability to identify  changes in lifestyle to reduce recurrence of condition will improve Ability to verbalize feelings will improve Ability to disclose and discuss suicidal ideas Ability to demonstrate self-control will improve Ability to identify and develop effective coping behaviors will improve Ability to maintain clinical measurements within normal limits will improve Compliance with prescribed medications will improve Ability to identify changes in lifestyle to reduce recurrence of condition will improve Ability to verbalize feelings will improve Ability to disclose and discuss suicidal ideas Ability to demonstrate self-control will improve Ability to identify and develop effective coping behaviors will improve Ability to maintain clinical measurements within normal limits will improve Compliance with prescribed medications will improve     Medication Management: Evaluate patient's response, side effects, and tolerance of medication regimen.  Therapeutic Interventions: 1 to 1 sessions, Unit Group sessions and Medication administration.  Evaluation of Outcomes: Adequate for Discharge   RN Treatment Plan for Primary Diagnosis: MDD (major depressive disorder), recurrent severe, without psychosis ([place]) Long Term Goal(s): Knowledge of disease and therapeutic  regimen to maintain health will improve  Short Term Goals: Ability to participate in decision making will improve, Ability to verbalize feelings will improve, Ability to disclose and discuss suicidal ideas and Ability to identify and develop effective coping behaviors will improve  Medication Management: RN will administer medications as ordered by provider, will assess and evaluate patient's response and provide education to patient for prescribed medication. RN will report any adverse and/or side effects to prescribing provider.  Therapeutic Interventions: 1 on 1 counseling sessions, Psychoeducation, Medication administration, Evaluate responses to  treatment, Monitor vital signs and CBGs as ordered, Perform/monitor CIWA, COWS, AIMS and Fall Risk screenings as ordered, Perform wound care treatments as ordered.  Evaluation of Outcomes: Adequate for Discharge   LCSW Treatment Plan for Primary Diagnosis: MDD (major depressive disorder), recurrent severe, without psychosis ([place]) Long Term Goal(s): Safe transition to appropriate next level of care at discharge, Engage patient in therapeutic group addressing interpersonal concerns.  Short Term Goals: Engage patient in aftercare planning with referrals and resources, Increase social support, Increase emotional regulation, Identify triggers associated with mental health/substance abuse issues and Increase skills for wellness and recovery  Therapeutic Interventions: Assess for all discharge needs, 1 to 1 time with Social worker, Explore available resources and support systems, Assess for adequacy in community support network, Educate family and significant other(s) on suicide prevention, Complete Psychosocial Assessment, Interpersonal group therapy.  Evaluation of Outcomes: Adequate for Discharge   Progress in Treatment: Attending groups: Yes. Participating in groups: Yes. Taking medication as prescribed: Yes. Toleration medication: Yes. Family/Significant other contact made: No, will contact:  patient declines consents. SPE completed with patient. Patient understands diagnosis: Yes. Discussing patient identified problems/goals with staff: Yes. Medical problems stabilized or resolved: Yes. Denies suicidal/homicidal ideation: No. Issues/concerns per patient self-inventory: No.  New problem(s) identified: Yes, Describe:  recently unemployed, may have financial barriers to outpaitent follow up  New Short Term/Long Term Goal(s): medication management for mood stabilization; elimination of SI thoughts; development of comprehensive mental wellness/sobriety plan.  Patient Goals: "Feel well enough  to go home. I need to reset."  Discharge Plan or Barriers: CSW continuing to assess. Current with [facility] for outpatient and attends a weekly support group.   Reason for Continuation of Hospitalization: None   Estimated Length of Stay: Discharging, 08/19/2018  Attendees: Patient: [name] 08/19/2018 11:03 AM  Physician: Dr. Neita Garnet, MD 08/19/2018 11:03 AM  Nursing: Sharl Ma.Viona Gilmore, RN 08/19/2018 11:03 AM  RN Care Manager: 08/19/2018 11:03 AM  Social Worker: Stephanie Acre, [place]; [place], [place] 08/19/2018 11:03 AM  Recreational Therapist:  08/19/2018 11:03 AM  Other: Harriett Sine, NP 08/19/2018 11:03 AM  Other:  08/19/2018 11:03 AM  Other: 08/19/2018 11:03 AM    Scribe for Treatment Team: [name], [place] 08/19/2018 11:03 AM

## 2018-08-24 ENCOUNTER — Telehealth: Payer: Self-pay

## 2018-08-24 NOTE — Telephone Encounter (Signed)
Left a detail vm  

## 2018-08-24 NOTE — Telephone Encounter (Signed)
Patient would like a referral to a allergist. Patient states that the medications are not working.

## 2018-08-25 NOTE — Telephone Encounter (Signed)
Left a message for patient to call back to schedule appointment.

## 2018-09-02 ENCOUNTER — Other Ambulatory Visit: Payer: Self-pay

## 2018-09-02 ENCOUNTER — Encounter: Payer: Self-pay | Admitting: Physical Therapy

## 2018-09-02 ENCOUNTER — Ambulatory Visit: Payer: Medicaid Other | Attending: Family Medicine | Admitting: Physical Therapy

## 2018-09-02 DIAGNOSIS — M6281 Muscle weakness (generalized): Secondary | ICD-10-CM | POA: Insufficient documentation

## 2018-09-02 DIAGNOSIS — M25512 Pain in left shoulder: Secondary | ICD-10-CM | POA: Diagnosis present
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
## 2018-09-09 ENCOUNTER — Ambulatory Visit: Payer: Medicaid Other | Admitting: Physical Therapy

## 2018-09-09 ENCOUNTER — Telehealth: Payer: Self-pay | Admitting: Physical Therapy

## 2018-09-09 NOTE — Telephone Encounter (Signed)
Left message on voicemail regarding no-show to appointment today. Left next appointment time and asked that he call to reschedule or cancel if needed.

## 2018-09-16 ENCOUNTER — Ambulatory Visit: Payer: Medicaid Other | Admitting: Physical Therapy

## 2018-09-23 ENCOUNTER — Ambulatory Visit: Payer: Medicaid Other | Admitting: Physical Therapy

## 2018-09-30 ENCOUNTER — Ambulatory Visit: Payer: Medicaid Other | Admitting: Physical Therapy

## 2018-10-04 ENCOUNTER — Telehealth: Payer: Self-pay | Admitting: Physical Therapy

## 2018-10-04 NOTE — Telephone Encounter (Signed)
[name] was contacted today regarding the temporary reduction of OP Rehab Services due to concerns for community transmission of Covid-19.    Therapist advised the patient to continue to perform their HEP and assured they had no unanswered questions at this time.  The patient was offered and declined the continuation in their POC by using methods such as an e-visit, virtual check in, or telehealth visit.    Outpatient Rehabilitation Services will follow up with this client when we are able to safely resume care at the [facility] in person.   Patient is aware we can be reached by telephone during limited business hours in the meantime.

## 2018-10-25 ENCOUNTER — Emergency Department (HOSPITAL_COMMUNITY): Payer: Medicaid Other

## 2018-10-25 ENCOUNTER — Encounter (HOSPITAL_COMMUNITY): Payer: Self-pay | Admitting: General Practice

## 2018-10-25 ENCOUNTER — Encounter (HOSPITAL_COMMUNITY)
Admission: EM | Disposition: A | Discharge status: Home / Self Care | Payer: Self-pay | Source: Home / Self Care | Attending: Cardiovascular Disease

## 2018-10-25 ENCOUNTER — Other Ambulatory Visit: Payer: Self-pay

## 2018-10-25 ENCOUNTER — Inpatient Hospital Stay (HOSPITAL_COMMUNITY)
Admission: EM | Admit: 2018-10-25 | Discharge: 2018-10-26 | DRG: 251 | Disposition: A | Discharge status: Home / Self Care | Payer: Medicaid Other | Attending: Cardiovascular Disease | Admitting: Cardiovascular Disease

## 2018-10-25 DIAGNOSIS — R0789 Other chest pain: Secondary | ICD-10-CM | POA: Diagnosis present

## 2018-10-25 DIAGNOSIS — F339 Major depressive disorder, recurrent, unspecified: Secondary | ICD-10-CM | POA: Diagnosis present

## 2018-10-25 DIAGNOSIS — Z7982 Long term (current) use of aspirin: Secondary | ICD-10-CM

## 2018-10-25 DIAGNOSIS — Z87891 Personal history of nicotine dependence: Secondary | ICD-10-CM

## 2018-10-25 DIAGNOSIS — Z79899 Other long term (current) drug therapy: Secondary | ICD-10-CM

## 2018-10-25 DIAGNOSIS — T82855A Stenosis of coronary artery stent, initial encounter: Secondary | ICD-10-CM | POA: Diagnosis present

## 2018-10-25 DIAGNOSIS — Z8249 Family history of ischemic heart disease and other diseases of the circulatory system: Secondary | ICD-10-CM

## 2018-10-25 DIAGNOSIS — I200 Unstable angina: Secondary | ICD-10-CM

## 2018-10-25 DIAGNOSIS — Y831 Surgical operation with implant of artificial internal device as the cause of abnormal reaction of the patient, or of later complication, without mention of misadventure at the time of the procedure: Secondary | ICD-10-CM | POA: Diagnosis present

## 2018-10-25 DIAGNOSIS — I10 Essential (primary) hypertension: Secondary | ICD-10-CM | POA: Diagnosis present

## 2018-10-25 DIAGNOSIS — Z7951 Long term (current) use of inhaled steroids: Secondary | ICD-10-CM | POA: Diagnosis not present

## 2018-10-25 DIAGNOSIS — Y712 Prosthetic and other implants, materials and accessory cardiovascular devices associated with adverse incidents: Secondary | ICD-10-CM | POA: Diagnosis present

## 2018-10-25 DIAGNOSIS — Z9861 Coronary angioplasty status: Secondary | ICD-10-CM

## 2018-10-25 DIAGNOSIS — E119 Type 2 diabetes mellitus without complications: Secondary | ICD-10-CM | POA: Diagnosis present

## 2018-10-25 DIAGNOSIS — E785 Hyperlipidemia, unspecified: Secondary | ICD-10-CM | POA: Diagnosis present

## 2018-10-25 DIAGNOSIS — Z9889 Other specified postprocedural states: Secondary | ICD-10-CM

## 2018-10-25 DIAGNOSIS — Z888 Allergy status to other drugs, medicaments and biological substances status: Secondary | ICD-10-CM

## 2018-10-25 DIAGNOSIS — I2510 Atherosclerotic heart disease of native coronary artery without angina pectoris: Secondary | ICD-10-CM

## 2018-10-25 DIAGNOSIS — I252 Old myocardial infarction: Secondary | ICD-10-CM

## 2018-10-25 DIAGNOSIS — Z7984 Long term (current) use of oral hypoglycemic drugs: Secondary | ICD-10-CM | POA: Diagnosis not present

## 2018-10-25 DIAGNOSIS — Z8673 Personal history of transient ischemic attack (TIA), and cerebral infarction without residual deficits: Secondary | ICD-10-CM | POA: Diagnosis not present

## 2018-10-25 DIAGNOSIS — I25110 Atherosclerotic heart disease of native coronary artery with unstable angina pectoris: Secondary | ICD-10-CM | POA: Diagnosis present

## 2018-10-25 DIAGNOSIS — R079 Chest pain, unspecified: Secondary | ICD-10-CM | POA: Diagnosis present

## 2018-10-25 HISTORY — PX: LEFT HEART CATH AND CORONARY ANGIOGRAPHY: CATH118249

## 2018-10-25 HISTORY — DX: Other specified postprocedural states: Z98.890

## 2018-10-25 HISTORY — PX: CARDIAC CATHETERIZATION: SHX172

## 2018-10-25 HISTORY — PX: CORONARY BALLOON ANGIOPLASTY: CATH118233

## 2018-10-25 LAB — CBC
HCT: 41.7 % (ref 39.0–52.0)
Hemoglobin: 13.5 g/dL (ref 13.0–17.0)
MCH: 27.9 pg (ref 26.0–34.0)
MCHC: 32.4 g/dL (ref 30.0–36.0)
MCV: 86.2 fL (ref 80.0–100.0)
Platelets: 225 10*3/uL (ref 150–400)
RBC: 4.84 MIL/uL (ref 4.22–5.81)
RDW: 12.5 % (ref 11.5–15.5)
WBC: 5 10*3/uL (ref 4.0–10.5)
nRBC: 0 % (ref 0.0–0.2)

## 2018-10-25 LAB — CBC WITH DIFFERENTIAL/PLATELET
Abs Immature Granulocytes: 0.03 10*3/uL (ref 0.00–0.07)
Basophils Absolute: 0 10*3/uL (ref 0.0–0.1)
Basophils Relative: 1 %
Eosinophils Absolute: 0.6 10*3/uL — ABNORMAL HIGH (ref 0.0–0.5)
Eosinophils Relative: 9 %
HCT: 42.1 % (ref 39.0–52.0)
Hemoglobin: 13.9 g/dL (ref 13.0–17.0)
Immature Granulocytes: 1 %
Lymphocytes Relative: 36 %
Lymphs Abs: 2.2 10*3/uL (ref 0.7–4.0)
MCH: 28.4 pg (ref 26.0–34.0)
MCHC: 33 g/dL (ref 30.0–36.0)
MCV: 86.1 fL (ref 80.0–100.0)
Monocytes Absolute: 0.5 10*3/uL (ref 0.1–1.0)
Monocytes Relative: 9 %
Neutro Abs: 2.8 10*3/uL (ref 1.7–7.7)
Neutrophils Relative %: 44 %
Platelets: 247 10*3/uL (ref 150–400)
RBC: 4.89 MIL/uL (ref 4.22–5.81)
RDW: 12.6 % (ref 11.5–15.5)
WBC: 6.2 10*3/uL (ref 4.0–10.5)
nRBC: 0 % (ref 0.0–0.2)

## 2018-10-25 LAB — POCT ACTIVATED CLOTTING TIME: Activated Clotting Time: 285 seconds

## 2018-10-25 LAB — GLUCOSE, CAPILLARY
Glucose-Capillary: 110 mg/dL — ABNORMAL HIGH (ref 70–99)
Glucose-Capillary: 193 mg/dL — ABNORMAL HIGH (ref 70–99)
Glucose-Capillary: 138 mg/dL — ABNORMAL HIGH (ref 70–99)

## 2018-10-25 LAB — BASIC METABOLIC PANEL
Anion gap: 9 (ref 5–15)
BUN: 15 mg/dL (ref 6–20)
CO2: 24 mmol/L (ref 22–32)
Calcium: 9.5 mg/dL (ref 8.9–10.3)
Chloride: 106 mmol/L (ref 98–111)
Creatinine, Ser: 0.85 mg/dL (ref 0.61–1.24)
GFR calc Af Amer: >60 mL/min (ref >60)
GFR calc non Af Amer: >60 mL/min (ref >60)
Glucose, Bld: 149 mg/dL — ABNORMAL HIGH (ref 70–99)
Potassium: 4.3 mmol/L (ref 3.5–5.1)
Sodium: 139 mmol/L (ref 135–145)

## 2018-10-25 LAB — HEMOGLOBIN A1C
Hgb A1c MFr Bld: 6.6 % — ABNORMAL HIGH (ref 4.8–5.6)
Mean Plasma Glucose: 142.72 mg/dL

## 2018-10-25 LAB — CREATININE, SERUM
Creatinine, Ser: 0.8 mg/dL (ref 0.61–1.24)
GFR calc Af Amer: >60 mL/min (ref >60)
GFR calc non Af Amer: >60 mL/min (ref >60)

## 2018-10-25 LAB — TROPONIN I
Troponin I: <0.03 ng/mL (ref <0.03)
Troponin I: <0.03 ng/mL (ref <0.03)
Troponin I: <0.03 ng/mL (ref <0.03)
Troponin I: 0.03 ng/mL (ref <0.03)

## 2018-10-25 LAB — D-DIMER, QUANTITATIVE: D-Dimer, Quant: 0.38 ug/mL-FEU (ref 0.00–0.50)

## 2018-10-25 SURGERY — LEFT HEART CATH AND CORONARY ANGIOGRAPHY
Anesthesia: LOCAL

## 2018-10-25 MED ORDER — IOHEXOL 350 MG/ML SOLN
INTRAVENOUS | Status: DC | PRN
  Administered 2018-10-25: 100 mL via INTRA_ARTERIAL

## 2018-10-25 MED ORDER — LIDOCAINE HCL (PF) 1 % IJ SOLN
INTRAMUSCULAR | Status: DC | PRN
  Administered 2018-10-25: 3 mL

## 2018-10-25 MED ORDER — MORPHINE SULFATE (PF) 2 MG/ML IV SOLN
INTRAVENOUS | Status: AC
  Filled 2018-10-25: qty 1

## 2018-10-25 MED ORDER — SODIUM CHLORIDE 0.9% FLUSH: 3.0000 mL | Freq: Two times a day (BID) | INTRAVENOUS | Status: DC

## 2018-10-25 MED ORDER — HEPARIN SODIUM (PORCINE) 1000 UNIT/ML IJ SOLN
INTRAMUSCULAR | Status: AC
  Filled 2018-10-25: qty 1

## 2018-10-25 MED ORDER — ASPIRIN 81 MG PO CHEW
324.0000 mg | CHEWABLE_TABLET | Freq: Once | ORAL | Status: AC
  Administered 2018-10-25: 07:00:00 324 mg via ORAL
  Filled 2018-10-25: qty 4

## 2018-10-25 MED ORDER — ASPIRIN 81 MG PO TBEC: 81.0000 mg | DELAYED_RELEASE_TABLET | Freq: Every day | ORAL | Status: DC

## 2018-10-25 MED ORDER — LIDOCAINE HCL (PF) 1 % IJ SOLN
INTRAMUSCULAR | Status: AC
  Filled 2018-10-25: qty 30

## 2018-10-25 MED ORDER — ASPIRIN 81 MG PO CHEW: 324.0000 mg | CHEWABLE_TABLET | ORAL | Status: DC

## 2018-10-25 MED ORDER — NITROGLYCERIN 1 MG/10 ML FOR IR/CATH LAB
INTRA_ARTERIAL | Status: AC
  Filled 2018-10-25: qty 10

## 2018-10-25 MED ORDER — MIDAZOLAM HCL 2 MG/2ML IJ SOLN
INTRAMUSCULAR | Status: AC
  Filled 2018-10-25: qty 2

## 2018-10-25 MED ORDER — SODIUM CHLORIDE 0.9 % WEIGHT BASED INFUSION
3.0000 mL/kg/h | INTRAVENOUS | Status: DC
  Administered 2018-10-25: 13:00:00 3 mL/kg/h via INTRAVENOUS

## 2018-10-25 MED ORDER — NITROGLYCERIN 1 MG/10 ML FOR IR/CATH LAB
INTRA_ARTERIAL | Status: DC | PRN
  Administered 2018-10-25: 200 ug via INTRA_ARTERIAL

## 2018-10-25 MED ORDER — SODIUM CHLORIDE 0.9% FLUSH: 3.0000 mL | INTRAVENOUS | Status: DC | PRN

## 2018-10-25 MED ORDER — MIRTAZAPINE 7.5 MG PO TABS
15.0000 mg | ORAL_TABLET | Freq: Every day | ORAL | Status: DC
  Administered 2018-10-25: 15 mg via ORAL
  Filled 2018-10-25: qty 2

## 2018-10-25 MED ORDER — INSULIN ASPART 100 UNIT/ML ~~LOC~~ SOLN
0.0000 [IU] | Freq: Three times a day (TID) | SUBCUTANEOUS | Status: DC
  Administered 2018-10-25 – 2018-10-26 (×3): 3 [IU] via SUBCUTANEOUS

## 2018-10-25 MED ORDER — NITROGLYCERIN 0.4 MG SL SUBL
0.4000 mg | SUBLINGUAL_TABLET | SUBLINGUAL | Status: DC | PRN
  Administered 2018-10-25 (×3): 0.4 mg via SUBLINGUAL
  Filled 2018-10-25: qty 1

## 2018-10-25 MED ORDER — ASPIRIN 81 MG PO CHEW: 81.0000 mg | CHEWABLE_TABLET | ORAL | Status: DC

## 2018-10-25 MED ORDER — SODIUM CHLORIDE 0.9 % WEIGHT BASED INFUSION: 1.0000 mL/kg/h | INTRAVENOUS | Status: DC

## 2018-10-25 MED ORDER — FENTANYL CITRATE (PF) 100 MCG/2ML IJ SOLN
INTRAMUSCULAR | Status: AC
  Filled 2018-10-25: qty 2

## 2018-10-25 MED ORDER — ASPIRIN 300 MG RE SUPP: 300.0000 mg | RECTAL | Status: DC

## 2018-10-25 MED ORDER — SODIUM CHLORIDE 0.9 % IV SOLN
INTRAVENOUS | Status: AC
  Administered 2018-10-25: 14:00:00 via INTRAVENOUS

## 2018-10-25 MED ORDER — VERAPAMIL HCL 2.5 MG/ML IV SOLN
INTRAVENOUS | Status: DC | PRN
  Administered 2018-10-25: 10 mL via INTRA_ARTERIAL

## 2018-10-25 MED ORDER — ASPIRIN EC 81 MG PO TBEC: 81.0000 mg | DELAYED_RELEASE_TABLET | Freq: Every day | ORAL | Status: DC

## 2018-10-25 MED ORDER — SODIUM CHLORIDE 0.9 % IV SOLN: 250.0000 mL | INTRAVENOUS | Status: DC | PRN

## 2018-10-25 MED ORDER — PRAVASTATIN SODIUM 10 MG PO TABS
20.0000 mg | ORAL_TABLET | Freq: Every day | ORAL | Status: DC
  Administered 2018-10-25: 17:00:00 20 mg via ORAL
  Filled 2018-10-25: qty 2

## 2018-10-25 MED ORDER — MIDAZOLAM HCL 2 MG/2ML IJ SOLN
INTRAMUSCULAR | Status: DC | PRN
  Administered 2018-10-25: 2 mg via INTRAVENOUS
  Administered 2018-10-25: 1 mg via INTRAVENOUS

## 2018-10-25 MED ORDER — VERAPAMIL HCL 2.5 MG/ML IV SOLN
INTRAVENOUS | Status: AC
  Filled 2018-10-25: qty 2

## 2018-10-25 MED ORDER — FENTANYL CITRATE (PF) 100 MCG/2ML IJ SOLN
INTRAMUSCULAR | Status: DC | PRN
  Administered 2018-10-25 (×3): 25 ug via INTRAVENOUS

## 2018-10-25 MED ORDER — HEPARIN (PORCINE) IN NACL 1000-0.9 UT/500ML-% IV SOLN
INTRAVENOUS | Status: DC | PRN
  Administered 2018-10-25: 500 mL

## 2018-10-25 MED ORDER — ACETAMINOPHEN 325 MG PO TABS: 650.0000 mg | ORAL_TABLET | ORAL | Status: DC | PRN

## 2018-10-25 MED ORDER — HEPARIN SODIUM (PORCINE) 1000 UNIT/ML IJ SOLN
INTRAMUSCULAR | Status: DC | PRN
  Administered 2018-10-25: 9000 [IU] via INTRAVENOUS
  Administered 2018-10-25: 3000 [IU] via INTRAVENOUS

## 2018-10-25 MED ORDER — ASPIRIN 81 MG PO CHEW
81.0000 mg | CHEWABLE_TABLET | Freq: Every day | ORAL | Status: DC
  Administered 2018-10-26: 09:00:00 81 mg via ORAL
  Filled 2018-10-25: qty 1

## 2018-10-25 MED ORDER — HEPARIN SODIUM (PORCINE) 5000 UNIT/ML IJ SOLN
5000.0000 [IU] | Freq: Three times a day (TID) | INTRAMUSCULAR | Status: DC
  Administered 2018-10-25 – 2018-10-26 (×2): 5000 [IU] via SUBCUTANEOUS
  Filled 2018-10-25 (×2): qty 1

## 2018-10-25 MED ORDER — ONDANSETRON HCL 4 MG/2ML IJ SOLN: 4.0000 mg | Freq: Four times a day (QID) | INTRAMUSCULAR | Status: DC | PRN

## 2018-10-25 MED ORDER — HYDRALAZINE HCL 20 MG/ML IJ SOLN: 10.0000 mg | INTRAMUSCULAR | Status: AC | PRN

## 2018-10-25 MED ORDER — TICAGRELOR 90 MG PO TABS
90.0000 mg | ORAL_TABLET | Freq: Two times a day (BID) | ORAL | Status: DC
  Administered 2018-10-25 – 2018-10-26 (×2): 90 mg via ORAL
  Filled 2018-10-25 (×2): qty 1

## 2018-10-25 MED ORDER — LABETALOL HCL 5 MG/ML IV SOLN: 10.0000 mg | INTRAVENOUS | Status: AC | PRN

## 2018-10-25 MED ORDER — HEPARIN (PORCINE) IN NACL 1000-0.9 UT/500ML-% IV SOLN
INTRAVENOUS | Status: AC
  Filled 2018-10-25: qty 1000

## 2018-10-25 MED ORDER — TICAGRELOR 90 MG PO TABS
ORAL_TABLET | ORAL | Status: AC
  Filled 2018-10-25: qty 2

## 2018-10-25 MED ORDER — ARIPIPRAZOLE 2 MG PO TABS
2.0000 mg | ORAL_TABLET | Freq: Every day | ORAL | Status: DC
  Administered 2018-10-25: 2 mg via ORAL
  Filled 2018-10-25 (×2): qty 1

## 2018-10-25 MED ORDER — CARVEDILOL 6.25 MG PO TABS
6.2500 mg | ORAL_TABLET | Freq: Two times a day (BID) | ORAL | Status: DC
  Administered 2018-10-25 – 2018-10-26 (×2): 6.25 mg via ORAL
  Filled 2018-10-25 (×2): qty 1

## 2018-10-25 MED ORDER — TICAGRELOR 90 MG PO TABS
ORAL_TABLET | ORAL | Status: DC | PRN
  Administered 2018-10-25: 180 mg via ORAL

## 2018-10-25 MED ORDER — MORPHINE SULFATE (PF) 2 MG/ML IV SOLN
2.0000 mg | INTRAVENOUS | Status: DC | PRN
  Administered 2018-10-25: 2 mg via INTRAVENOUS

## 2018-10-25 SURGICAL SUPPLY — 19 items
BALLN SAPPHIRE ~~LOC~~ 3.75X12 (BALLOONS) ×1 IMPLANT
BALLN WOLVERINE 3.50X10 (BALLOONS) ×2
BALLOON WOLVERINE 3.50X10 (BALLOONS) IMPLANT
CATH 5FR JL3.5 JR4 ANG PIG MP (CATHETERS) ×1 IMPLANT
CATH INFINITI 5 FR 3DRC (CATHETERS) ×1 IMPLANT
CATH INFINITI 5FR JL4 (CATHETERS) ×1 IMPLANT
CATH LAUNCHER 6FR EBU 3.75 (CATHETERS) ×1 IMPLANT
CATH LAUNCHER 6FR EBU3.5 (CATHETERS) ×1 IMPLANT
DEVICE RAD COMP TR BAND LRG (VASCULAR PRODUCTS) ×1 IMPLANT
GLIDESHEATH SLEND SS 6F .021 (SHEATH) ×1 IMPLANT
GUIDEWIRE INQWIRE 1.5J.035X260 (WIRE) IMPLANT
INQWIRE 1.5J .035X260CM (WIRE) ×2
KIT ENCORE 26 ADVANTAGE (KITS) ×1 IMPLANT
KIT HEART LEFT (KITS) ×2 IMPLANT
KIT HEMO VALVE WATCHDOG (MISCELLANEOUS) ×1 IMPLANT
PACK CARDIAC CATHETERIZATION (CUSTOM PROCEDURE TRAY) ×2 IMPLANT
TRANSDUCER W/STOPCOCK (MISCELLANEOUS) ×2 IMPLANT
TUBING CIL FLEX 10 FLL-RA (TUBING) ×2 IMPLANT
WIRE ASAHI PROWATER 180CM (WIRE) ×1 IMPLANT

## 2018-10-25 NOTE — Interval H&P Note (Signed)
Cath Lab Visit (complete for each Cath Lab visit)  Clinical Evaluation Leading to the Procedure:   ACS: Yes.    Non-ACS:    Anginal Classification: CCS IV  Anti-ischemic medical therapy: Minimal Therapy (1 class of medications)  Non-Invasive Test Results: No non-invasive testing performed  Prior CABG: No previous CABG      History and Physical Interval Note:  10/25/2018 12:54 PM  [name]  has presented today for surgery, with the diagnosis of chest pain.  The various methods of treatment have been discussed with the patient and family. After consideration of risks, benefits and other options for treatment, the patient has consented to  Procedure(s): LEFT HEART CATH AND CORONARY ANGIOGRAPHY (N/A) as a surgical intervention.  The patient's history has been reviewed, patient examined, no change in status, stable for surgery.  I have reviewed the patient's chart and labs.  Questions were answered to the patient's satisfaction.     Larae Grooms

## 2018-10-25 NOTE — Progress Notes (Addendum)
Patient c/o cp 7/10, radiates to right arm.  Given SL NTG with no change in pain, second SL NTG given with no change in pain.  EKG done.  NP Mancel Bale paged, awaiting call back.

## 2018-10-25 NOTE — H&P (Signed)
History & Physical    Patient ID: [name] MRN: [id number], DOB/AGE: 02-19-1961   Admit date: 10/25/2018  Primary Physician: Kenneth Glatter, FNP Primary Cardiologist: Dr. Debara Pickett, [name]  Patient Profile    [name] is a 58 year old male with a history of coronary artery disease, hypertension, dyslipidemia, DM 2 and microvascular angina s/p multiple PCI's and in-stent restenosis who is being seen by cardiology today for chest pain.  Past Medical History   Past Medical History:  Diagnosis Date   Allergies    Arthritis    Chronic lower back pain    Coronary artery disease    a. Multiple prior caths/PCI. Cath 2013 with possible spasm of RCA, 70% ISR of mid LCx with subsequent DES to mLCx and prox LCX. b. H/o microvascular angina. c. Recurrent angina 08/2014 - s/p PTCA/DES to prox Cx, PTCA/CBA to OM1.  c. LHC 06/10/15 with patent stents and some ISR in LCX and OM-1 that was not flow limiting --> Rx    Dyslipidemia    a. Intolerant to many statins except tolerating Livalo.   GERD (gastroesophageal reflux disease)    Hypertension    Myocardial infarction [facility]) ~ 2010   S/P angioplasty with stent, DES, to proximal and mid LCX 12/15/11 12/15/2011   Shoulder pain    Stroke ([place])    pt. reports had a stroke around time of MI 2010   Type II diabetes mellitus ([place])     Past Surgical History:  Procedure Laterality Date   CARDIAC CATHETERIZATION  06/15/2002   LAD with prox 40% stenosis, norma L main, Cfx with 25% lesion, RCA with long mid 25% stenosis (Dr. Vita [name])   CARDIAC CATHETERIZATION  04/01/2010   normal L main, LAD wit mild stenosis, L Cfx with 70% in-stent restenosis, RCA with 70% in-stent restenosis, LVEF >60% (Dr. K. Mali [name]) - cutting ballon arthrectomy to RCA & Cfx (Dr. Rockne [name])   CARDIAC CATHETERIZATION  08/25/2010   preserved global LV contractility; multivessel CAD, diffuse 90-95% in-stent restenosis in prox placed Cfx stent - cutting balloon  arthrectomy in Cfx with multiple dilatations 90-95% to 0% stenosis (Dr. Corky [name])   CARDIAC CATHETERIZATION  01/26/2011   PCI & stenting of aggresive in-stent restenosis within previously stented AV groove Cfx with 3.0x58m Taxus DES (previous stents were Promus) (Dr. JAdora [name]   CARDIAC CATHETERIZATION  05/11/2011   preserved LV function, 40% mid LAD stenosis, 30-40% narrowing proximal to stented semgnet of prox Cfx, patent mid RCA stent with smooth 20% narrowing in distal RCA (Dr. TCorky [name]   CARDIAC CATHETERIZATION  12/15/2011   PCI & stenting of proximal & mid Cfx with DES - 3.0x156min proximal, 3.0x1550mn mid (Dr. J. Adora [name] [facility] 06/10/2015   Procedure: Left Heart Cath and Coronary Angiography;  Surgeon: [name];  Location: [facility];  Service: Cardiovascular;  Laterality: N/A;   cardiometabolic testing  08/13/07/7353good exercise effort, peak VO2 79% predicted with normal VO2 HR curves (mild deconditioning)   COLONOSCOPY  12/2012   diminutive hyperplastic sigmoid poyp so repeat routine 2024   EXCISIONAL HEMORRHOIDECTOMY  1984   LEFT HEART CATHETERIZATION WITH CORONARY ANGIOGRAM N/A 05/11/2011   Procedure: LEFT HEART CATHETERIZATION WITH CORONARY ANGIOGRAM;  Surgeon: [name];  [name];  Service: Cardiovascular;  Laterality: N/A;  Possible percutaneous coronary intervention, possible IVUS   LEFT HEART CATHETERIZATION WITH CORONARY ANGIOGRAM N/A 12/15/2011  Procedure: LEFT HEART CATHETERIZATION WITH CORONARY ANGIOGRAM;  Surgeon: [name];  Location: [facility] CATH LAB;  Service: Cardiovascular;  Laterality: N/A;   LEFT HEART CATHETERIZATION WITH CORONARY ANGIOGRAM N/A 09/05/2014   Procedure: LEFT HEART CATHETERIZATION WITH CORONARY ANGIOGRAM;  Surgeon: Kenneth Blanks, [name];  Location: [facility] CATH LAB;  Service: Cardiovascular;  Laterality: N/A;   LIPOMA EXCISION     back of the head   NM MYOCAR PERF WALL  MOTION  02/2012   lexiscan myoview; mild perfusion defect in mid inferolateral & basal inferolateral region (infarct/scar); EF 52%, abnormal but ow risk scan   PERCUTANEOUS CORONARY STENT INTERVENTION (PCI-S)  09/05/2014   Procedure: PERCUTANEOUS CORONARY STENT INTERVENTION (PCI-S);  Surgeon: Kenneth Blanks, [name];  Location: [facility] CATH LAB;  Service: Cardiovascular;;    Allergies  Allergies  Allergen Reactions   Bee Venom Anaphylaxis and Hives   Shellfish Allergy Anaphylaxis and Hives   Statins Other (See Comments)    Myalgias. Tolerating livalo.    Testosterone Cypionate     Testerone Injection --Increased breast tissue    History of Present Illness    [name] is a 58 year old male with a history stated above who presented to [facility] 10/25/2018 with complaints of resting and exertional chest pain that is been ongoing for approximately one month however has been more intense over the last 2 weeks.  He reports being a very active person and was, before COVID-19, going to the gym 4-5 times per week without problems. Since gym closures, he states his been riding his bike however has recently noticed chest pain while riding which is new for him.  Additionally, he states that he lives in an apartment and will also get chest discomfort with climbing the stairs to his door. Just yesterday, while riding his bike he states that he had to stop to catch his breath which is extremely unusual for him.  Initially he had contributed this to deconditioning with not going to the gym for the last month however has become more concerned given that his current symptoms are reminiscent of his prior MI. He denies LE swelling, orthopnea symptoms, palpitations, recent illness including fever, chills, nausea or vomiting. He denies dizziness or syncope. He reports complete medication compliance. He denies alcohol, tobacco or illicit drug use.  He has a prior history of significant CAD including multiple PCI and in-stent  restenosis per cath report below. His last cath was in 2016 per which showed proximal LCx to mid LCx lesion approximated at 40% stenosed that was previously treated with a stent, first marginal with 45% stenosis and distal LAD with 35% stenosis indicating two-vessel CAD with patent stent in the MRCA and stents in the proximal LCx and OM1 with mild to moderate ISD at approximately 40 to 50%. These were not flow-limiting and plans were to continue with medical treatment and aggressive risk modification. Prior to this, cath was performed 2003 initially, then 2011, multiple in 2012, 2013 and lastly in 2016 as outlined below. In most cases, he had various degrees of in-stent restenosis,  some with subsequent stenting and others not.    He was last seen by his cardiologist 01/04/2017 and reported losing approximately 20 pounds and completing regular exercise however still had occasional episodes of angina which were nitrate responsive.  On several occasions, he had been asked to be taken off of his statin and would prefer to use a more natural approach however Dr. Debara [name] reminded him of his significant CAD and multiple  stents that statins are indicated despite dietary changes.  He was last seen by Jory Sims, NP 02/09/2018 for CAD follow-up and was without complaints. His PCP had noted that his most recent EKG at that time revealed sinus bradycardia and wanted him to be seen by cardiology for evaluation.  At that time, he had run out of his Livalo and was awaiting insurance approval. He continued to workout 4 days/week at the [facility] including elliptical and treadmill training. He denied chest pain, fatigue or worsening dyspnea with his exercise. He was noted to be doing well and plan was to see him in 65-monthfollow-up.  In the ED, EKG with no acute ischemic changes.  Troponin negative at <0.03.  Creatinine stable at 0.85.  D-dimer negative at 0.38.  TSH within normal range at 1.014.   Cardiology has been asked to  admit given his significant CAD history for further evaluation.  Past Cath History:  Procedure Laterality Date   CARDIAC CATHETERIZATION 06/15/2002  LAD with prox 40% stenosis, norma L main, Cfx with 25% lesion, RCA with long mid 25% stenosis (Dr. JVita [name]   CARDIAC CATHETERIZATION 04/01/2010  normal L main, LAD wit mild stenosis, L Cfx with 70% in-stent restenosis, RCA with 70% in-stent restenosis, LVEF >60% (Dr. K. CMaliHilty) - cutting ballon arthrectomy to RCA & Cfx (Dr. ARockne [name]   CARDIAC CATHETERIZATION 08/25/2010  preserved global LV contractility; multivessel CAD, diffuse 90-95% in-stent restenosis in prox placed Cfx stent - cutting balloon arthrectomy in Cfx with multiple dilatations 90-95% to 0% stenosis (Dr. TCorky [name]   CARDIAC CATHETERIZATION 01/26/2011  PCI & stenting of aggresive in-stent restenosis within previously stented AV groove Cfx with 3.0x238mTaxus DES (previous stents were Promus) (Dr. J.Adora [name]  CARDIAC CATHETERIZATION 05/11/2011  preserved LV function, 40% mid LAD stenosis, 30-40% narrowing proximal to stented semgnet of prox Cfx, patent mid RCA stent with smooth 20% narrowing in distal RCA (Dr. T.[name]  CARDIAC CATHETERIZATION 12/15/2011  PCI & stenting of proximal & mid Cfx with DES - 3.0x1257mn proximal, 3.0x15m39m mid (Dr. J. BAdora FridgeCARDChampion 06/10/2015  Procedure: Left Heart Cath and Coronary Angiography; Surgeon: DaniJolaine Artist; [facility]; Service: Cardiovascular; Laterality: N/A;   Home Medications    Prior to Admission medications   Medication Sig Start Date End Date Taking? Authorizing Provider  amLODipine (NORVASC) 10 MG tablet Take 1 tablet (10 mg total) by mouth daily. 05/03/18  Yes StroAzzie GlatterP  ARIPiprazole (ABILIFY) 2 MG tablet Take 1 tablet (2 mg total) by mouth at bedtime. For mood 08/19/18  Yes SykeConnye Burkitt  aspirin (EQ ASPIRIN ADULT LOW DOSE) 81 MG EC tablet Take 1 tablet  (81 mg total) by mouth daily. Swallow whole. 01/27/18  Yes StroAzzie GlatterP  carvedilol (COREG) 25 MG tablet Take 1 tablet (25 mg total) by mouth 2 (two) times daily with a meal. For high blood pressure 05/03/18  Yes StroAzzie GlatterP  cetirizine (ZYRTEC) 10 MG tablet Take 1 tablet (10 mg total) by mouth daily. 07/15/18  Yes StroAzzie GlatterP  co-enzyme Q-10 30 MG capsule Take 30 mg by mouth daily.   Yes Provider, Historical, [name]  fluticasone (FLONASE) 50 MCG/ACT nasal spray Place 1 spray into both nostrils daily. 11/10/17  Yes PickDavonna Belling  LIVALO 4 MG TABS Take 4 tablets by mouth every morning. 09/23/18  Yes Provider, Historical, [name]  metFORMIN (  GLUCOPHAGE-XR) 500 MG 24 hr tablet Take 1 tablet (500 mg total) by mouth daily with breakfast. For diabetes management 05/03/18  Yes Kenneth Glatter, FNP  mirtazapine (REMERON) 15 MG tablet Take 1 tablet (15 mg total) by mouth at bedtime. For mood 08/19/18  Yes Connye Burkitt, NP  omega-3 acid ethyl esters (LOVAZA) 1 g capsule Take 1 capsule (1 g total) by mouth 2 (two) times daily. For high cholesterol 05/03/18  Yes Kenneth Glatter, FNP  ACCU-CHEK AVIVA PLUS test strip USE UP TO 3 TIMES A DAY 03/01/18   Provider, Historical, [name]  ACCU-CHEK FASTCLIX LANCETS MISC USE UP TO THREE TIMES A DAY 03/01/18   Provider, Historical, [name]  Blood Glucose Monitoring Suppl (ACCU-CHEK AVIVA PLUS) w/Device KIT USE UP TO THREE TIMES A DAY 03/01/18   Provider, Historical, [name]  ibuprofen (ADVIL,MOTRIN) 800 MG tablet Take 1 tablet (800 mg total) by mouth every 8 (eight) hours as needed for moderate pain. Patient not taking: Reported on 10/25/2018 07/26/18   Kenneth Glatter, FNP  lisinopril (PRINIVIL,ZESTRIL) 10 MG tablet Take 1 tablet (10 mg total) by mouth daily. For high blood pressure Patient not taking: Reported on 10/25/2018 08/20/18   Connye Burkitt, NP    Family History    Family History  Problem Relation Age of Onset   Leukemia Mother    Prostate cancer  Father    Coronary artery disease Paternal Grandmother    Cancer Paternal Grandfather    Cancer Brother    Social History    Social History   Socioeconomic History   Marital status: Divorced    Spouse name: Not on file   Number of children: 2   Years of education: GED   Highest education level: Not on file  Occupational History   Not on file  Social Needs   Financial resource strain: Not on file   Food insecurity:    Worry: Not on file    Inability: Not on file   Transportation needs:    Medical: Not on file    Non-medical: Not on file  Tobacco Use   Smoking status: Former Smoker    Packs/day: 1.00    Years: 10.00    Pack years: 10.00    Types: Cigarettes    Last attempt to quit: 04/07/2009    Years since quitting: 9.5   Smokeless tobacco: Never Used  Substance and Sexual Activity   Alcohol use: Yes   Drug use: No    Comment: 12/15/11 "last cocaine was 2010"   Sexual activity: Not Currently  Lifestyle   Physical activity:    Days per week: Not on file    Minutes per session: Not on file   Stress: Not on file  Relationships   Social connections:    Talks on phone: Not on file    Gets together: Not on file    Attends religious service: Not on file    Active member of club or organization: Not on file    Attends meetings of clubs or organizations: Not on file    Relationship status: Not on file   Intimate partner violence:    Fear of current or ex partner: Not on file    Emotionally abused: Not on file    Physically abused: Not on file    Forced sexual activity: Not on file  Other Topics Concern   Not on file  Social History Narrative   Not on file     Review of  Systems   See HPI  All other systems reviewed and are otherwise negative except as noted above.  Physical Exam    Blood pressure 118/86, pulse (!) 55, temperature 97.8 F (36.6 C), temperature source Oral, resp. rate 16, SpO2 100 %.   General: Pleasant,  well-nourished, A&O in NAD, appears stated age Psych: Normal affect. Neuro: Alert and oriented X 3. Moves all extremities spontaneously. HEENT: Eyes symmetric, no orbital edema. Conjunctive pink, sclera white, PERRLA, vision grossly intact.  Neck: Supple neck veins, no bruits or JVD.Trachea midline, no cervical lymphadenopathy.  Lungs: Symmetrical chest wall expansion.CTA w/ RRR. No accessory muscle use.  Heart: RRR no s3, s4, or murmurs.  Abdomen: Soft, non-tender, non-distended, BS + x 4.  Extremities: No clubbing, cyanosis or edema. DP/PT/Radials 2+ and equal bilaterally. Right radial pulse 2+ MSK: No palpable chest wall or thoracic tenderness  Labs    Troponin (Point of Care Test) No results for input(s): TROPIPOC in the last 72 hours. Recent Labs    10/25/18 0646  TROPONINI <0.03   Lab Results  Component Value Date   WBC 6.2 10/25/2018   HGB 13.9 10/25/2018   HCT 42.1 10/25/2018   MCV 86.1 10/25/2018   PLT 247 10/25/2018    Recent Labs  Lab 10/25/18 0646  NA 139  K 4.3  CL 106  CO2 24  BUN 15  CREATININE 0.85  CALCIUM 9.5  GLUCOSE 149*   Lab Results  Component Value Date   CHOL 144 10/03/2017   HDL 51 10/03/2017   LDLCALC 62 10/03/2017   TRIG 157 (H) 10/03/2017   Lab Results  Component Value Date   DDIMER 0.38 10/25/2018   Radiology Studies    Dg Chest 2 View  Result Date: 10/25/2018 CLINICAL DATA:  Right-sided chest pains EXAM: CHEST - 2 VIEW COMPARISON:  11/15/2016 FINDINGS: The heart size and mediastinal contours are within normal limits. Coronary artery stent. Both lungs are clear. Disc degenerative disease of the thoracic spine. IMPRESSION: No acute abnormality of the lungs. Electronically Signed   By: Eddie Candle M.D.   On: 10/25/2018 08:33   ECG & Cardiac Imaging    Cardiac catheterization 01/26/2011: SELECTIVE CORONARY ANGIOGRAPHY: 1. Left main normal. 2. LAD; the LAD had a 40-50% segmental stenosis in the midportion     between first and  second diagonal branches. 3. Circumflex; the proximal half of the AV groove circumflex stented     segment had 90% diffuse in-stent restenosis. 4. Right coronary artery; dominant with a patent stent in the     midportion. 5. Left ventriculography; RAO left ventriculogram was performed using     25 mL of Visipaque dye at 12 mL per second.  The overall LVEF     estimated greater than 60% without focal wall motion abnormalities.  IMPRESSION:  Mr. Johnson has had aggressive in-stent restenosis, now 5 months post cutting balloon atherectomy of his mid AV groove circumflex stented segment.  These were stented with thrombus drug-eluting stents in October of 2011.  We will proceed with the stenting using Taxus iron drug-eluting stents since it has a different antiproliferative drug. With this restenosis, the patient will need either brachytherapy or a one-vessel CABG.  Cardiac catheterization 05/11/2011: IMPRESSION: 1. Preserved global left ventricular function with very mild mid     posterolateral hypocontractility. 2. A 40% mid left anterior descending stenosis. 3. No significant restenosis in previously placed tandem overlapping     stents in the circumflex vessel with smooth 30-40%  narrowing just     proximal to the stented segment in the very proximal circumflex     system. 4. Patent mid RCA stent with smooth 20% narrowing in the distal right     coronary artery.  RECOMMENDATION:  Increase medical therapy.  Cardiac catheterization 12/15/2011: ANGIOGRAPHIC RESULTS:   1. Left main; normal  2. LAD; minor irregularities 3. Left circumflex; 75% segmental proximal just proximal to the previously placed stents.75 Percent in-stent restenosis" within the mid circumflex proximal.  4. Right coronary artery; patent stent in the midportion. 200 mcg of nitroglycerin was administered because of what appeared to be spasm on the proximal and distal portion of the vessel which improved  angiographically 5. Left ventriculography; RAO left ventriculogram was performed using  25 mL of Visipaque dye at 12 mL/second. The overall LVEF estimated  60 %  Without wall motion abnormalities  IMPRESSION:Mr. Has progression of disease in his proximal circumflex and "in-stent restenosis within the mid AV groove circumflex. Will proceed with PCI and stenting using resolute drug-eluting stents, aspirin, Plavix, and Angiomax.  Cardiac catheterization 09/05/2014: Impression: 1. 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 Normal LV systolic function 3. Unstable angina 4. Successful PTCA/DES x 1 proximal Circumflex 5. Successful PTCA/cutting balloon angioplasty OM1  Recommendations: Will continue ASA and Plavix, beta blocker and statin.   Cardiac catheterization 06/10/2015:  Prox Cx to Mid Cx lesion, 40% stenosed. The lesion was previously treated with a stent (unknown type).  1st Mrg lesion, 45% stenosed.  Dist LAD lesion, 35% stenosed.   Assessment:  1) 2 vessel CAD with patent stent in mRCA. Stents in proximal LCX and OM1 with mild to moderate ISE ~40-50%. 2) LVEDP 18  Plan/Discussion:  There is some ISR in the LCX and OM-1 but not flow-limiting. Will treat medically for now with aggressive risk modification. Films reviewed with Dr. Gwenlyn Found of the interventional team. Can go home later today if post-cath course uncomplicated.        Assessment & Plan    1. Chest pain with known hx of CAD s/p multiple PCI's/in-stent restenosis: -Patient presented with exertional and resting chest discomfort over the last month which has intensified over the last 2 weeks while riding his bike and/or climbing stairs, similar to prior symptoms in 2012/2013 -Last cardiac catheterization 05/2015 with two-vessel CAD with patent stent in the MRCA and stents in the proximal LCx and OM1 with mild to moderate ISC at approximately 40 to  50% however, not considered flow-limiting. Plan was to treat medically with aggressive risk modifications -Was last seen for CAD follow-up 02/09/2018 and was doing well from a cardiac perspective.  Has been exercising on a regular basis and had lost approximately 20 to 30 pounds in weight.  He had no concern concern for anginal symptoms and was doing relatively well. -EKG, NSR with no acute changes -Troponin, <0.03 will continue to trend -CXR with no acute cardiopulmonary disease  -Given significant history of CAD and more recent anginal symptoms with prior hx of multiple PCIs and history of in-stent restenosis would lean more towards further work-up with cardiac catheterization to evaluate present coronary anatomy.  -Consider adding heparin gtt for unstable angina despite normal troponin -Continue ASA 81 -Noted to be statin intolerant, on Livalo -Given mild bradycardia would reduce carvedilol to 6.25 mg twice daily for now monitor heart rate, patient asymptomatic -Hold metformin, lisinopril in anticipation of cardiac catheterization  2.  Hyperlipidemia: -Last  LDL 10/03/2017, 62 at goal -Noted to be statin intolerant -Continue Livalo and positive lifestyle modifications with diet and exercise  3.  DM2: -Controlled, 5.8 07/15/2018 -Hold metformin in anticipation of cardiac catheterization -SSI for glucose control while inpatient status -Monitored by PCP  4. Depression: -Continue home regimen  -Management per OP PCP    Severity of Illness: The appropriate patient status for this patient is OBSERVATION. Observation status is judged to be reasonable and necessary in order to provide the required intensity of service to ensure the patient's safety. The patient's presenting symptoms, physical exam findings, and initial radiographic and laboratory data in the context of their medical condition is felt to place them at decreased risk for further clinical deterioration. Furthermore, it is anticipated  that the patient will be medically stable for discharge from the hospital within 2 midnights of admission. The following factors support the patient status of observation.   " The patient's presenting symptoms include chest pain. " The physical exam findings include chest pain. " The initial radiographic and laboratory data are hx of CAD with PCI/in-stent restenosis.    SignedKathyrn Drown NP-C [facility] Pager: [phone] 10/25/2018, _0   Patient seen, examined. Available data reviewed. Agree with findings, assessment, and plan as outlined by Kathyrn Drown, NP-C.  The patient is independently interviewed and examined.  My exam findings are documented above.  He is a 58 year old male with diabetes and extensive coronary artery disease.  He is undergone multiple PCI procedures, last in 2016.  He has done quite well until the past 3 to 4 weeks when he has developed classic symptoms of crescendo angina.  He is now having substernal chest pressure with low-level activity and has intermittently had rest pain.  His symptoms are highly typical of his previous angina.  He has been maintained on antiplatelet therapy with aspirin and antianginal therapy with amlodipine and carvedilol.  I reviewed his previous cardiac catheterization films which demonstrated moderate left circumflex in-stent restenosis.  The LAD and RCA were patent at the time of his last heart catheterization.  There was some proximal vasospasm of the RCA noted but this improved with intracoronary nitroglycerin.  I agree that cardiac catheterization and possible PCI are indicated.  Fortunately, initial troponin is negative.  Clinical syndrome is consistent with unstable angina.  I have reviewed the risks, indications, and alternatives to cardiac catheterization, possible angioplasty, and stenting with the patient. Risks include but are not limited to bleeding, infection, vascular injury, stroke, myocardial infection, arrhythmia, kidney injury,  radiation-related injury in the case of prolonged fluoroscopy use, emergency cardiac surgery, and death. The patient understands the risks of serious complication is 1-2 in 7262 with diagnostic cardiac cath and 1-2% or less with angioplasty/stenting.   Sherren Mocha, M.D. 10/25/2018 12:38 PM

## 2018-10-25 NOTE — ED Triage Notes (Addendum)
For the past 2 weeks patient states he has been having 10/10 chest pain anytime he exerts himself. Has been unable to do normal activities without getting winded. He does endorse pain goes away with rest. History of cardiac disease.

## 2018-10-25 NOTE — Progress Notes (Signed)
Patient transferred from ED at 1235hrs.  Oriented to unit and initial assessment done. Patient taken to cath lab at 1245hrs via bed.

## 2018-10-25 NOTE — Progress Notes (Signed)
Patient back from cath lab.  Given post cath instructions. R Radial site CDI, level 0, TR band in place.

## 2018-10-25 NOTE — ED Provider Notes (Signed)
Emergency Department Provider Note   I have reviewed the triage vital signs and the nursing notes.   HISTORY  Chief Complaint Chest Pain   HPI [place] is a 58 y.o. male with PMH of CAD, HLD, HTN, and DM presents to the emergency department for evaluation of 2 weeks of intermittent chest pain.  He has pain both at rest and with exertion.  He states that his pain becomes severe with exertion and limits his activities.  He states that he is fallen off of his diet and regular exercise routine.  He continues to take his home medications.  He denies any fevers, chills, sore throat, cough. No active pain at this time. Pain is "discomfort" in the center of the chest and non-radiating.    Past Medical History:  Diagnosis Date  . Allergies   . Arthritis   . Chronic lower back pain   . Coronary artery disease    a. Multiple prior caths/PCI. Cath 2013 with possible spasm of RCA, 70% ISR of mid LCx with subsequent DES to mLCx and prox LCX. b. H/o microvascular angina. c. Recurrent angina 08/2014 - s/p PTCA/DES to prox Cx, PTCA/CBA to OM1.  c. LHC 06/10/15 with patent stents and some ISR in LCX and OM-1 that was not flow limiting --> Rx   . Dyslipidemia    a. Intolerant to many statins except tolerating Livalo.  Marland Kitchen GERD (gastroesophageal reflux disease)   . Hypertension   . Myocardial infarction ([place]) ~ 2010  . S/P angioplasty with stent, DES, to proximal and mid LCX 12/15/11 12/15/2011  . Shoulder pain   . Stroke [facility])    pt. reports had a stroke around time of MI 2010  . Type II diabetes mellitus [facility])     Patient Active Problem List   Diagnosis Date Noted  . Chest pain 10/25/2018  . MDD (major depressive disorder), recurrent severe, without psychosis ([place]) 08/15/2018  . Influenza A 07/17/2017  . Lactic acidosis   . Cough   . Nausea and vomiting   . Severe episode of recurrent major depressive disorder, without psychotic features ([place])   . MDD (major depressive disorder),  single episode, severe with psychotic features ([place]) 06/27/2017  . MDD (major depressive disorder), severe ([place]) 06/23/2017  . Essential hypertension 01/13/2016  . Coronary artery disease involving native coronary artery of native heart without angina pectoris 01/13/2016  . Unstable angina ([place])   . ED (erectile dysfunction) 02/15/2014  . Allergy to contrast media (used for diagnostic x-rays) 05/12/2011  . Hypertension 05/09/2011  . Hyperlipidemia 05/09/2011  . DM2 (diabetes mellitus, type 2) ([place]) 07/10/2010  . CAD S/P percutaneous coronary angioplasty 07/10/2010  . DYSPEPSIA&OTHER [facility] DISORDERS FUNCTION STOMACH 07/10/2010    Past Surgical History:  Procedure Laterality Date  . CARDIAC CATHETERIZATION  06/15/2002   LAD with prox 40% stenosis, norma L main, Cfx with 25% lesion, RCA with Demetrios Byron mid 25% stenosis (Dr. Vita Barley)  . CARDIAC CATHETERIZATION  04/01/2010   normal L main, LAD wit mild stenosis, L Cfx with 70% in-stent restenosis, RCA with 70% in-stent restenosis, LVEF >60% (Dr. K. Mali Hilty) - cutting ballon arthrectomy to RCA & Cfx (Dr. Rockne Menghini)  . CARDIAC CATHETERIZATION  08/25/2010   preserved global LV contractility; multivessel CAD, diffuse 90-95% in-stent restenosis in prox placed Cfx stent - cutting balloon arthrectomy in Cfx with multiple dilatations 90-95% to 0% stenosis (Dr. Corky Downs)  . CARDIAC CATHETERIZATION  01/26/2011   PCI & stenting of  aggresive in-stent restenosis within previously stented AV groove Cfx with 3.0x103mm Taxus DES (previous stents were Promus) (Dr. Adora Fridge)  . CARDIAC CATHETERIZATION  05/11/2011   preserved LV function, 40% mid LAD stenosis, 30-40% narrowing proximal to stented semgnet of prox Cfx, patent mid RCA stent with smooth 20% narrowing in distal RCA (Dr. Corky Downs)  . CARDIAC CATHETERIZATION  12/15/2011   PCI & stenting of proximal & mid Cfx with DES - 3.0x63mm in proximal, 3.0x33mm in mid (Dr. Adora Fridge)  . CARDIAC CATHETERIZATION N/A  06/10/2015   Procedure: Left Heart Cath and Coronary Angiography;  Surgeon: Jolaine Artist, MD;  Location: [place] CV LAB;  Service: Cardiovascular;  Laterality: N/A;  . cardiometabolic testing  10/05/8117   good exercise effort, peak VO2 79% predicted with normal VO2 HR curves (mild deconditioning)  . COLONOSCOPY  12/2012   diminutive hyperplastic sigmoid poyp so repeat routine 2024  . EXCISIONAL HEMORRHOIDECTOMY  1984  . LEFT HEART CATHETERIZATION WITH CORONARY ANGIOGRAM N/A 05/11/2011   Procedure: LEFT HEART CATHETERIZATION WITH CORONARY ANGIOGRAM;  Surgeon: Troy Sine, MD;  Location: [facility] CATH LAB;  Service: Cardiovascular;  Laterality: N/A;  Possible percutaneous coronary intervention, possible IVUS  . LEFT HEART CATHETERIZATION WITH CORONARY ANGIOGRAM N/A 12/15/2011   Procedure: LEFT HEART CATHETERIZATION WITH CORONARY ANGIOGRAM;  Surgeon: Lorretta Harp, MD;  Location: [facility] CATH LAB;  Service: Cardiovascular;  Laterality: N/A;  . LEFT HEART CATHETERIZATION WITH CORONARY ANGIOGRAM N/A 09/05/2014   Procedure: LEFT HEART CATHETERIZATION WITH CORONARY ANGIOGRAM;  Surgeon: Burnell Blanks, MD;  Location: [facility] CATH LAB;  Service: Cardiovascular;  Laterality: N/A;  . LIPOMA EXCISION     back of the head  . NM MYOCAR PERF WALL MOTION  02/2012   lexiscan myoview; mild perfusion defect in mid inferolateral & basal inferolateral region (infarct/scar); EF 52%, abnormal but ow risk scan  . PERCUTANEOUS CORONARY STENT INTERVENTION (PCI-S)  09/05/2014   Procedure: PERCUTANEOUS CORONARY STENT INTERVENTION (PCI-S);  Surgeon: Burnell Blanks, MD;  Location: [facility] CATH LAB;  Service: Cardiovascular;;    Allergies Bee venom; Shellfish allergy; Statins; and Testosterone cypionate  Family History  Problem Relation Age of Onset  . Leukemia Mother   . Prostate cancer Father   . Coronary artery disease Paternal Grandmother   . Cancer Paternal Grandfather   . Cancer Brother     Social  History Social History   Tobacco Use  . Smoking status: Former Smoker    Packs/day: 1.00    Years: 10.00    Pack years: 10.00    Types: Cigarettes    Last attempt to quit: 04/07/2009    Years since quitting: 9.5  . Smokeless tobacco: Never Used  Substance Use Topics  . Alcohol use: Yes  . Drug use: No    Comment: 12/15/11 "last cocaine was 2010"    Review of Systems  Constitutional: No fever/chills Eyes: No visual changes. ENT: No sore throat. Cardiovascular: Positive chest pain. Respiratory: Positive shortness of breath. Gastrointestinal: No abdominal pain.  No nausea, no vomiting.  No diarrhea.  No constipation. Genitourinary: Negative for dysuria. Musculoskeletal: Negative for back pain. Skin: Negative for rash. Neurological: Negative for headaches, focal weakness or numbness.  10-point ROS otherwise negative.  ____________________________________________   PHYSICAL EXAM:  VITAL SIGNS: ED Triage Vitals  Enc Vitals Group     BP 10/25/18 0648 (!) 131/93     Pulse Rate 10/25/18 0648 64     Resp 10/25/18 0648 11  Temp 10/25/18 0648 97.8 F (36.6 C)     Temp Source 10/25/18 0648 Oral     SpO2 10/25/18 0648 100 %   Constitutional: Alert and oriented. Well appearing and in no acute distress. Eyes: Conjunctivae are normal. Head: Atraumatic. Nose: No congestion/rhinnorhea. Mouth/Throat: Mucous membranes are moist.  Neck: No stridor.   Cardiovascular: Normal rate, regular rhythm. Good peripheral circulation. Grossly normal heart sounds.   Respiratory: Normal respiratory effort.  No retractions. Lungs CTAB. Gastrointestinal: Soft and nontender. No distention.  Musculoskeletal: No lower extremity tenderness nor edema. No gross deformities of extremities. Neurologic:  Normal speech and language. No gross focal neurologic deficits are appreciated.  Skin:  Skin is warm, dry and intact. No rash noted.  ____________________________________________   LABS (all labs  ordered are listed, but only abnormal results are displayed)  Labs Reviewed  BASIC METABOLIC PANEL - Abnormal; Notable for the following components:      Result Value   Glucose, Bld 149 (*)    All other components within normal limits  CBC WITH DIFFERENTIAL/PLATELET - Abnormal; Notable for the following components:   Eosinophils Absolute 0.6 (*)    All other components within normal limits  TROPONIN I  D-DIMER, QUANTITATIVE (NOT AT [facility])  TROPONIN I  HEMOGLOBIN A1C   ____________________________________________  EKG   EKG Interpretation  Date/Time:  Tuesday October 25 2018 06:47:25 EDT Ventricular Rate:  62 PR Interval:    QRS Duration: 182 QT Interval:  387 QTC Calculation: 393 R Axis:   24 Text Interpretation:  Sinus rhythm No significant change since last tracing Confirmed by Thayer Jew [phone]) on 10/25/2018 6:51:20 AM       ____________________________________________  RADIOLOGY  Dg Chest 2 View  Result Date: 10/25/2018 CLINICAL DATA:  Right-sided chest pains EXAM: CHEST - 2 VIEW COMPARISON:  11/15/2016 FINDINGS: The heart size and mediastinal contours are within normal limits. Coronary artery stent. Both lungs are clear. Disc degenerative disease of the thoracic spine. IMPRESSION: No acute abnormality of the lungs. Electronically Signed   By: Eddie Candle M.D.   On: 10/25/2018 08:33    ____________________________________________   PROCEDURES  Procedure(s) performed:   Procedures  None  ____________________________________________   INITIAL IMPRESSION / ASSESSMENT AND PLAN / ED COURSE  Pertinent labs & imaging results that were available during my care of the patient were reviewed by me and considered in my medical decision making (see chart for details).   Patient with history of CAD presents to the emergency department with worsening chest pain over the past 2 weeks.  He last had a heart cath in 2016 and was found to have two-vessel CAD at that  time.  He had 2 proximal stents placed at LCX and OM1.  EKG similar to prior tracings.  Lower suspicion for PE but with his associated dyspnea plan for d-dimer in addition to troponin and chest x-ray. HEART score 5.   Labs reviewed along with CXR. Patient remains chest pain free. Likely admit for high risk CP.   Discussed patient's case with Cardiology to request admission. Patient and family (if present) updated with plan. Care transferred to Cardiology service.  I reviewed all nursing notes, vitals, pertinent old records, EKGs, labs, imaging (as available).  ____________________________________________  FINAL CLINICAL IMPRESSION(S) / ED DIAGNOSES  Final diagnoses:  Unstable angina ([name])     MEDICATIONS GIVEN DURING THIS VISIT:  Medications  insulin aspart (novoLOG) injection 0-15 Units (has no administration in time range)  aspirin chewable tablet 324 mg (  324 mg Oral Given 10/25/18 0725)     Note:  This document was prepared using Dragon voice recognition software and may include unintentional dictation errors.  Nanda Quinton, MD Emergency Medicine    Leland Staszewski, [facility], MD 10/25/18 1146

## 2018-10-25 NOTE — Progress Notes (Signed)
Patient reports chest pain now a 1 out of 10 after 2mg  Morphine IV.  Will continue to monitor.

## 2018-10-26 ENCOUNTER — Telehealth: Payer: Self-pay | Admitting: Cardiology

## 2018-10-26 ENCOUNTER — Encounter (HOSPITAL_COMMUNITY): Payer: Self-pay | Admitting: Interventional Cardiology

## 2018-10-26 LAB — BASIC METABOLIC PANEL
Anion gap: 9 (ref 5–15)
BUN: 15 mg/dL (ref 6–20)
CO2: 25 mmol/L (ref 22–32)
Calcium: 9.2 mg/dL (ref 8.9–10.3)
Chloride: 106 mmol/L (ref 98–111)
Creatinine, Ser: 0.81 mg/dL (ref 0.61–1.24)
GFR calc Af Amer: >60 mL/min (ref >60)
GFR calc non Af Amer: >60 mL/min (ref >60)
Glucose, Bld: 176 mg/dL — ABNORMAL HIGH (ref 70–99)
Potassium: 4 mmol/L (ref 3.5–5.1)
Sodium: 140 mmol/L (ref 135–145)

## 2018-10-26 LAB — CBC
HCT: 38.6 % — ABNORMAL LOW (ref 39.0–52.0)
Hemoglobin: 12.7 g/dL — ABNORMAL LOW (ref 13.0–17.0)
MCH: 28.1 pg (ref 26.0–34.0)
MCHC: 32.9 g/dL (ref 30.0–36.0)
MCV: 85.4 fL (ref 80.0–100.0)
Platelets: 211 10*3/uL (ref 150–400)
RBC: 4.52 MIL/uL (ref 4.22–5.81)
RDW: 12.5 % (ref 11.5–15.5)
WBC: 7.3 10*3/uL (ref 4.0–10.5)
nRBC: 0 % (ref 0.0–0.2)

## 2018-10-26 LAB — LIPID PANEL
Cholesterol: 143 mg/dL (ref 0–200)
HDL: 41 mg/dL (ref >40)
LDL Cholesterol: 77 mg/dL (ref 0–99)
Total CHOL/HDL Ratio: 3.5 RATIO
Triglycerides: 125 mg/dL (ref <150)
VLDL: 25 mg/dL (ref 0–40)

## 2018-10-26 LAB — GLUCOSE, CAPILLARY
Glucose-Capillary: 154 mg/dL — ABNORMAL HIGH (ref 70–99)
Glucose-Capillary: 193 mg/dL — ABNORMAL HIGH (ref 70–99)

## 2018-10-26 LAB — HIV ANTIBODY (ROUTINE TESTING W REFLEX): HIV Screen 4th Generation wRfx: NONREACTIVE

## 2018-10-26 LAB — TROPONIN I: Troponin I: 0.05 ng/mL (ref <0.03)

## 2018-10-26 MED ORDER — CARVEDILOL 6.25 MG PO TABS: 6.2500 mg | ORAL_TABLET | Freq: Two times a day (BID) | ORAL | 3 refills | Status: DC

## 2018-10-26 MED ORDER — ASPIRIN 81 MG PO CHEW: 81.0000 mg | CHEWABLE_TABLET | Freq: Every day | ORAL | 3 refills | Status: DC

## 2018-10-26 MED ORDER — LOSARTAN POTASSIUM 25 MG PO TABS: 25.0000 mg | ORAL_TABLET | Freq: Every day | ORAL | 3 refills | Status: DC

## 2018-10-26 MED ORDER — NITROGLYCERIN 0.4 MG SL SUBL: 0.4000 mg | SUBLINGUAL_TABLET | SUBLINGUAL | 2 refills | Status: DC | PRN

## 2018-10-26 MED ORDER — TICAGRELOR 90 MG PO TABS: 90.0000 mg | ORAL_TABLET | Freq: Two times a day (BID) | ORAL | 2 refills | Status: DC

## 2018-10-26 MED ORDER — LOSARTAN POTASSIUM 25 MG PO TABS
25.0000 mg | ORAL_TABLET | Freq: Every day | ORAL | Status: DC
  Administered 2018-10-26: 25 mg via ORAL
  Filled 2018-10-26: qty 1

## 2018-10-26 MED FILL — LOSARTAN POTASSIUM 25 MG TA: 25 | 30 days supply | Qty: 30 | Fill #0 | Status: TO

## 2018-10-26 MED FILL — NITROGLYCERIN 0.4 MG TAB SL: 0.4 | 8 days supply | Qty: 25 | Fill #0 | Status: TO

## 2018-10-26 MED FILL — CARVEDILOL 6.25 MG TABLET: 6.25 | 30 days supply | Qty: 60 | Fill #0 | Status: TO

## 2018-10-26 MED FILL — BRILINTA 90 MG TABLET: 90 | 30 days supply | Qty: 60 | Fill #0 | Status: TO

## 2018-10-26 MED FILL — ASPIRIN LOW DOSE 81 MG CHEW: 81 | 30 days supply | Qty: 30 | Fill #0

## 2018-10-26 NOTE — Progress Notes (Addendum)
CRITICAL VALUE ALERT Troponin Critical Value:  0.05  Date & Time Notied:  10/26/2018  Provider Notified: Paticia Stack  Orders Received/Actions taken: pt denies cp

## 2018-10-26 NOTE — Discharge Summary (Signed)
Discharge Summary    Patient ID: [name],  MRN: [id number], DOB/AGE: 57/25/1962 57 y.o.  Admit date: 10/25/2018 Discharge date: 10/26/2018  Primary Care Provider: Azzie Glatter Primary Cardiologist: Dr. Debara Pickett   Discharge Diagnoses    Principal Problem:   Chest pain Active Problems:   DM2 (diabetes mellitus, type 2) ([place])   CAD S/P percutaneous coronary angioplasty   Hyperlipidemia   Essential hypertension   Allergies Allergies  Allergen Reactions  . Bee Venom Anaphylaxis and Hives  . Shellfish Allergy Anaphylaxis and Hives  . Statins Other (See Comments)    Myalgias. Tolerating livalo.   . Testosterone Cypionate     Testerone Injection --Increased breast tissue     Diagnostic Studies/Procedures    Cath: 10/25/18   Previously placed Mid RCA stent (unknown type) is widely patent.  Prox Cx lesion is 80% stenosed, instent restenosis.  Scoring balloon angioplasty was performed using a BALLOON WOLVERINE 3.50X10, followed by a 3.75 [place] balloon.  Post intervention, there is a 0% residual stenosis.  2nd Mrg lesion is 80% stenosed, instent restenosis.  Scoring balloon angioplasty was performed using a BALLOON WOLVERINE 3.50X10, followed by a 3.75 [place] balloon.  Post intervention, there is a 0% residual stenosis.  Mid LAD lesion is 25% stenosed.  Ost 2nd Mrg to 2nd Mrg lesion is 25% stenosed.  There is no aortic valve stenosis.  LV end diastolic pressure is normal.  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 Able to complete left circ PCI, but would not use right radial approach in the future.  Prox Cx to Mid Cx lesion is 20% stenosed.   Dual antiplatelet therapy for 12 months.    Consdier clopidogrel monotherapy after 1 year given the multiple layers of stents that he has.   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  He reported being a very active person and was, before COVID-19, going to the gym 4-5 times per week without problems. Since gym closures, he stated he had been riding his bike however has recently noticed chest pain while riding which was new for him.  Additionally, he stated that he lives in an apartment and also developed chest discomfort with climbing the stairs to his door. The day prior to admission, while riding his bike he stated that he had to stop to catch his breath which was extremely unusual for him.  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 He denied LE swelling, orthopnea symptoms, palpitations, recent illness including fever, chills, nausea or vomiting. He denied dizziness or syncope. He reported complete medication compliance.   He has a prior history of significant CAD including multiple PCI and in-stent restenosis per cath report below. His last cath was in 2016 per which showed proximal LCx to mid LCx lesion approximated at 40% stenosed that was previously treated with a stent, first marginal with 45% stenosis and distal LAD with 35% stenosis indicating two-vessel CAD with patent stent in the MRCA  and stents in the proximal LCx and OM1 with mild to moderate ISD at approximately 40 to 50%. These were not flow-limiting and plans were to continue with medical treatment and aggressive risk modification. Prior to this, cath was performed 2003 initially, then 2011, multiple in 2012, 2013 and lastly in 2016. In most cases, he had various degrees of in-stent restenosis,  some with subsequent  stenting and others not.    He was last seen by his cardiologist 01/04/2017 and reported losing approximately 20 pounds and completing regular exercise however still had occasional episodes of angina which were nitrate responsive.  On several occasions, he had been asked to be taken off of his statin and would prefer to use a more natural approach however Dr. Debara Pickett reminded him of his significant CAD and multiple stents that statins are indicated despite dietary changes.  He was last seen by Jory Sims, NP 02/09/2018 for CAD follow-up and was without complaints. His PCP had noted that his most recent EKG at that time revealed sinus bradycardia and wanted him to be seen by cardiology for evaluation.  At that time, he had run out of his Livalo and was awaiting insurance approval. He continued to workout 4 days/week at the [facility] including elliptical and treadmill training. He denied chest pain, fatigue or worsening dyspnea with his exercise. He was noted to be doing well and plan was to see him in 57-monthfollow-up.  In the ED, EKG with no acute ischemic changes.  Troponin negative at <0.03.  Creatinine stable at 0.85.  D-dimer negative at 0.38.  TSH within normal range at 1.014.   Cardiology was asked to admit given his significant CAD history for further evaluation.   Hospital Course     Given his symptoms he was admitted and taken for cardiac cath noted above with patent mRCA stent, 80% pLcx ISR treated with scoring balloon, and 2nd OM 80% ISR treated with scoring balloon. Plan for DAPT with ASA/Brilinta for at least one year. Consider continuing monotherapy with plavix after one year given multiple layers of stenting. LDL noted at 77. Hx of statin intolerance and tolerating Livalo as an outpatient. Coreg was reduced to 6.274mBID on admission 2/2 to bradycardia. Placed on losartan post cath. Did have some chest pain post cath relieved by morphine. Troponin with very mild increase of 0.05.  Cardiac Rehab phase I completed via phone 2/2 COVID precautions.     Kenneth Fiscalas seen by Dr. CoBurt Knacknd determined stable for discharge home. Follow up in the office has been arranged. Medications are listed below.   _____________  Discharge Vitals Blood pressure 121/81, pulse (!) 59, temperature 97.9 F (36.6 C), temperature source Oral, resp. rate 14, height 5' 8"  (1.727 m), weight 85.3 kg, SpO2 96 %.  Filed Weights   10/25/18 1206 10/25/18 1229 10/26/18 0606  Weight: 88.5 kg 83.3 kg 85.3 kg    Labs & Radiologic Studies    CBC Recent Labs    10/25/18 0646 10/25/18 1446 10/26/18 0017  WBC 6.2 5.0 7.3  NEUTROABS 2.8  --   --   HGB 13.9 13.5 12.7*  HCT 42.1 41.7 38.6*  MCV 86.1 86.2 85.4  PLT 247 225 21761 Basic Metabolic Panel Recent Labs    10/25/18 0646 10/25/18 1446 10/26/18 0017  NA 139  --  140  K 4.3  --  4.0  CL 106  --  106  CO2 24  --  25  GLUCOSE 149*  --  176*  BUN 15  --  15  CREATININE 0.85 0.80 0.81  CALCIUM 9.5  --  9.2   Liver Function Tests No results for input(s): AST, ALT, ALKPHOS, BILITOT, PROT, ALBUMIN in the last 72 hours. No results for input(s): LIPASE, AMYLASE in the last 72 hours. Cardiac Enzymes Recent Labs    10/25/18 1446 10/25/18 1850 10/25/18 2349  TROPONINI <0.03 0.03* 0.05*   BNP Invalid input(s): POCBNP D-Dimer Recent Labs    10/25/18 0646  DDIMER 0.38   Hemoglobin A1C Recent Labs    10/25/18 0646  HGBA1C 6.6*   Fasting Lipid Panel Recent Labs    10/26/18 0017  CHOL 143  HDL 41  LDLCALC 77  TRIG 125  CHOLHDL 3.5   Thyroid Function Tests No results for input(s): TSH, T4TOTAL, T3FREE, THYROIDAB in the last 72 hours.  Invalid input(s): FREET3 _____________  Dg Chest 2 View  Result Date: 10/25/2018 CLINICAL DATA:  Right-sided chest pains EXAM: CHEST - 2 VIEW COMPARISON:  11/15/2016 FINDINGS: The heart size and mediastinal contours are within normal limits. Coronary artery stent. Both lungs are  clear. Disc degenerative disease of the thoracic spine. IMPRESSION: No acute abnormality of the lungs. Electronically Signed   By: Eddie Candle M.D.   On: 10/25/2018 08:33   Disposition   Pt is being discharged home today in good condition.  Follow-up Plans & Appointments    Follow-up Information    Almyra Deforest, [place] Follow up on 11/03/2018.   Specialties:  Cardiology, Radiology Why:  at [facility] for your follow up appt. This will be a telephone visit as we are not seeing patients in the office because of COVID precautions.  Contact information: [address] [place] [address] [phone]          Discharge Instructions    Amb Referral to Cardiac Rehabilitation   Complete by:  As directed    Diagnosis:  PTCA   Call MD for:  redness, tenderness, or signs of infection (pain, swelling, redness, odor or green/yellow discharge around incision site)   Complete by:  As directed    Diet - low sodium heart healthy   Complete by:  As directed    Discharge instructions   Complete by:  As directed    Radial Site Care Refer to this sheet in the next few weeks. These instructions provide you with information on caring for yourself after your procedure. Your caregiver may also give you more specific instructions. Your treatment has been planned according to current medical practices, but problems sometimes occur. Call your caregiver if you have any problems or questions after your procedure. HOME CARE INSTRUCTIONS You may shower the day after the procedure.Remove the bandage (dressing) and gently wash the site with plain soap and water.Gently pat the site dry.  Do not apply powder or lotion to the site.  Do not submerge the affected site in water for 3 to 5 days.  Inspect the site at least twice daily.  Do not flex or bend the affected arm for 24 hours.  No lifting over 5 pounds (2.3 kg) for 5 days after your procedure.  Do not drive home if you are discharged the same day of the  procedure. Have someone else drive you.  You may drive 24 hours after the procedure unless otherwise instructed by your caregiver.  What to expect: Any bruising will usually fade within 1 to 2 weeks.  Blood that collects in the tissue (hematoma) may be painful  to the touch. It should usually decrease in size and tenderness within 1 to 2 weeks.  SEEK IMMEDIATE MEDICAL CARE IF: You have unusual pain at the radial site.  You have redness, warmth, swelling, or pain at the radial site.  You have drainage (other than a small amount of blood on the dressing).  You have chills.  You have a fever or persistent symptoms for more than 72 hours.  You have a fever and your symptoms suddenly get worse.  Your arm becomes pale, cool, tingly, or numb.  You have heavy bleeding from the site. Hold pressure on the site.   PLEASE DO NOT MISS ANY DOSES OF YOUR BRILINTA!!!!! Also keep a log of you blood pressures and bring back to your follow up appt. Please call the office with any questions.   Patients taking blood thinners should generally stay away from medicines like ibuprofen, Advil, Motrin, naproxen, and Aleve due to risk of stomach bleeding. You may take Tylenol as directed or talk to your primary doctor about alternatives.   Increase activity slowly   Complete by:  As directed        Discharge Medications     Medication List    STOP taking these medications   amLODipine 10 MG tablet Commonly known as:  NORVASC   aspirin 81 MG EC tablet Commonly known as:  EQ Aspirin Adult Low Dose Replaced by:  aspirin 81 MG chewable tablet   ibuprofen 800 MG tablet Commonly known as:  ADVIL   lisinopril 10 MG tablet Commonly known as:  ZESTRIL     TAKE these medications   Accu-Chek Aviva Plus test strip Generic drug:  glucose blood USE UP TO 3 TIMES A DAY   Accu-Chek Aviva Plus w/Device Kit USE UP TO THREE TIMES A DAY   Accu-Chek FastClix Lancets Misc USE UP TO THREE TIMES A DAY    ARIPiprazole 2 MG tablet Commonly known as:  ABILIFY Take 1 tablet (2 mg total) by mouth at bedtime. For mood   aspirin 81 MG chewable tablet Chew 1 tablet (81 mg total) by mouth daily. Start taking on:  October 27, 2018 Replaces:  aspirin 81 MG EC tablet   carvedilol 6.25 MG tablet Commonly known as:  COREG Take 1 tablet (6.25 mg total) by mouth 2 (two) times daily with a meal. What changed:    medication strength  how much to take  additional instructions   cetirizine 10 MG tablet Commonly known as:  ZYRTEC Take 1 tablet (10 mg total) by mouth daily.   co-enzyme Q-10 30 MG capsule Take 30 mg by mouth daily.   fluticasone 50 MCG/ACT nasal spray Commonly known as:  FLONASE Place 1 spray into both nostrils daily.   Livalo 4 MG Tabs Generic drug:  Pitavastatin Calcium Take 4 tablets by mouth every morning.   losartan 25 MG tablet Commonly known as:  COZAAR Take 1 tablet (25 mg total) by mouth daily.   metFORMIN 500 MG 24 hr tablet Commonly known as:  GLUCOPHAGE-XR Take 1 tablet (500 mg total) by mouth daily with breakfast. For diabetes management   mirtazapine 15 MG tablet Commonly known as:  REMERON Take 1 tablet (15 mg total) by mouth at bedtime. For mood   nitroGLYCERIN 0.4 MG SL tablet Commonly known as:  NITROSTAT Place 1 tablet (0.4 mg total) under the tongue every 5 (five) minutes x 3 doses as needed for chest pain.   omega-3 acid ethyl esters 1 g capsule  Commonly known as:  LOVAZA Take 1 capsule (1 g total) by mouth 2 (two) times daily. For high cholesterol   ticagrelor 90 MG Tabs tablet Commonly known as:  BRILINTA Take 1 tablet (90 mg total) by mouth 2 (two) times daily.        Acute coronary syndrome (MI, NSTEMI, STEMI, etc) this admission?: Yes.     AHA/ACC Clinical Performance & Quality Measures: 1. Aspirin prescribed? - Yes 2. ADP Receptor Inhibitor (Plavix/Clopidogrel, Brilinta/Ticagrelor or Effient/Prasugrel) prescribed (includes  medically managed patients)? - Yes 3. Beta Blocker prescribed? - Yes 4. High Intensity Statin (Lipitor 40-27m or Crestor 20-482m prescribed? - No 5. EF assessed during THIS hospitalization? - Yes 6. For EF <40%, was ACEI/ARB prescribed? - Yes 7. For EF <40%, Aldosterone Antagonist (Spironolactone or Eplerenone) prescribed? - Not Applicable (EF >/= 4099%8. Cardiac Rehab Phase II ordered (Included Medically managed Patients)? - Yes    Outstanding Labs/Studies   N/a   Duration of Discharge Encounter   Greater than 30 minutes including physician time.  Signed, LiReino BellisP-C 10/26/2018, 11:46 AM   Patient seen, examined. Available data reviewed. Agree with findings, assessment, and plan as outlined by LiReino BellisNP. On my exam: Vitals:   10/25/18 2100 10/26/18 0606  BP: 127/80 121/81  Pulse: 66 (!) 59  Resp: 15 14  Temp: 97.6 F (36.4 C) 97.9 F (36.6 C)  SpO2: 99% 96%   Pt is alert and oriented, NAD HEENT: normal Neck: JVP - normal Lungs: CTA bilaterally CV: RRR without murmur or gallop Abd: soft, NT, Positive BS, no hepatomegaly Ext: no C/C/E, distal pulses intact and equal. Right radial site with soft edema but no firm hematoma. Radial pulse 2+ Skin: warm/dry no rash  Cardiac catheterization data reviewed.  The patient underwent cutting balloon angioplasty of 2 sites of in-stent restenosis yesterday without complication.  He will be treated with at least 12 months of dual antiplatelet therapy with aspirin and ticagrelor since he presented with acute coronary syndrome.  I agree with Dr. VaIrish Lackhat he might be a good long-term antiplatelet candidate with either ticagrelor 60 mg twice daily or clopidogrel 75 mg daily after 12 months.  Ongoing efforts at secondary risk reduction as outlined above.  The patient is treated with a beta-blocker.  We will add a low-dose ARB in the setting of diabetes and CAD.  He has not been able to tolerate a high intensity statin  drug.  Currently on Livalo.  Managed by Dr. HiDebara Picketts an outpatient.  Kenneth MochaM.D. 10/26/2018 11:46 AM

## 2018-10-26 NOTE — Telephone Encounter (Signed)
Message sent to provider 

## 2018-10-26 NOTE — Clinical Social Work Note (Signed)
Patient will not have a ride until after 7:00 tonight. He does not have any other ride home and no money to pay for a cab. Confirmed address, filled out cab voucher, and gave to nurse.  CSW signing off.  Dayton Scrape, [place]

## 2018-10-26 NOTE — Discharge Instructions (Signed)
YOUR CARDIOLOGY TEAM HAS ARRANGED FOR AN E-VISIT FOR YOUR APPOINTMENT - PLEASE REVIEW IMPORTANT INFORMATION BELOW SEVERAL DAYS PRIOR TO YOUR APPOINTMENT  Due to the recent COVID-19 pandemic, we are transitioning in-person office visits to tele-medicine visits in an effort to decrease unnecessary exposure to our patients, their families, and staff. These visits are billed to your insurance just like a normal visit is. We also encourage you to sign up for MyChart if you have not already done so. You will need a smartphone if possible. For patients that do not have this, we can still complete the visit using a regular telephone but do prefer a smartphone to enable video when possible. You may have a family member that lives with you that can help. If possible, we also ask that you have a blood pressure cuff and scale at home to measure your blood pressure, heart rate and weight prior to your scheduled appointment. Patients with clinical needs that need an in-person evaluation and testing will still be able to come to the office if absolutely necessary. If you have any questions, feel free to call our office.     YOUR PROVIDER WILL BE USING THE FOLLOWING PLATFORM TO COMPLETE YOUR VISIT: TELEPHONE   IF USING MYCHART - How to Download the MyChart App to Your SmartPhone   - If Apple, go to [employer] and type in MyChart in the search bar and download the app. If Android, ask patient to go to [employer] and type in [place] in the search bar and download the app. The app is free but as with any other app downloads, your phone may require you to verify saved payment information or Apple/Android password.  - You will need to then log into the app with your MyChart username and password, and select [facility] as your healthcare provider to link the account.  - When it is time for your visit, go to the MyChart app, find appointments, and click Begin Video Visit. Be sure to Select Allow for your device to  access the Microphone and Camera for your visit. You will then be connected, and your provider will be with you shortly.  **If you have any issues connecting or need assistance, please contact MyChart service desk [phone])**  **If using a computer, in order to ensure the best quality for your visit, you will need to use either of the following Internet Browsers: Insurance underwriter or Microsoft Edge**   IF USING DOXIMITY or [email or web address] - The staff will give you instructions on receiving your link to join the meeting the day of your visit.      2-3 DAYS BEFORE YOUR APPOINTMENT  You will receive a telephone call from one of our [facility] team members - your caller ID may say "Unknown caller." If this is a video visit, we will walk you through how to get the video launched on your phone. We will remind you check your blood pressure, heart rate and weight prior to your scheduled appointment. If you have an Apple Watch or Kardia, please upload any pertinent ECG strips the day before or morning of your appointment to [facility]. Our staff will also make sure you have reviewed the consent and agree to move forward with your scheduled tele-health visit.     THE DAY OF YOUR APPOINTMENT  Approximately 15 minutes prior to your scheduled appointment, you will receive a telephone call from one of [facility] team - your caller ID may say "Unknown caller."  Our staff will confirm medications, vital signs for the day and any symptoms you may be experiencing. Please have this information available prior to the time of visit start. It may also be helpful for you to have a pad of paper and pen handy for any instructions given during your visit. They will also walk you through joining the smartphone meeting if this is a video visit.    CONSENT FOR TELE-HEALTH VISIT - PLEASE REVIEW  I hereby voluntarily request, consent and authorize [facility] and its employed or contracted physicians, physician  assistants, nurse practitioners or other licensed health care professionals (the Practitioner), to provide me with telemedicine health care services (the Services") as deemed necessary by the treating Practitioner. I acknowledge and consent to receive the Services by the Practitioner via telemedicine. I understand that the telemedicine visit will involve communicating with the Practitioner through live audiovisual communication technology and the disclosure of certain medical information by electronic transmission. I acknowledge that I have been given the opportunity to request an in-person assessment or other available alternative prior to the telemedicine visit and am voluntarily participating in the telemedicine visit.  I understand that I have the right to withhold or withdraw my consent to the use of telemedicine in the course of my care at any time, without affecting my right to future care or treatment, and that the Practitioner or I may terminate the telemedicine visit at any time. I understand that I have the right to inspect all information obtained and/or recorded in the course of the telemedicine visit and may receive copies of available information for a reasonable fee.  I understand that some of the potential risks of receiving the Services via telemedicine include:   Delay or interruption in medical evaluation due to technological equipment failure or disruption;  Information transmitted may not be sufficient (e.g. poor resolution of images) to allow for appropriate medical decision making by the Practitioner; and/or   In rare instances, security protocols could fail, causing a breach of personal health information.  Furthermore, I acknowledge that it is my responsibility to provide information about my medical history, conditions and care that is complete and accurate to the best of my ability. I acknowledge that Practitioner's advice, recommendations, and/or decision may be based on  factors not within their control, such as incomplete or inaccurate data provided by me or distortions of diagnostic images or specimens that may result from electronic transmissions. I understand that the practice of medicine is not an exact science and that Practitioner makes no warranties or guarantees regarding treatment outcomes. I acknowledge that I will receive a copy of this consent concurrently upon execution via email to the email address I last provided but may also request a printed copy by calling the office of [facility].    I understand that my insurance will be billed for this visit.   I have read or had this consent read to me.  I understand the contents of this consent, which adequately explains the benefits and risks of the Services being provided via telemedicine.   I have been provided ample opportunity to ask questions regarding this consent and the Services and have had my questions answered to my satisfaction.  I give my informed consent for the services to be provided through the use of telemedicine in my medical care  By participating in this telemedicine visit I agree to the above.

## 2018-10-26 NOTE — Progress Notes (Signed)
Cardiac Rehab Advisory Cardiac Rehab Phase I is not seeing pts face to face at this time due to Covid 19 restrictions. Ambulation is occurring through nursing, PT, and mobility teams. We will help facilitate that process as needed. We are calling pts in their rooms and discussing education. We will then deliver education materials to pts RN for delivery to pt.   [phone] Called pt on phone to complete ed. Reviewed importance of brilinta with stent. Reviewed NTG use, ex ed, discussed pt's diet and CRP 2. Pt eats very healthy and was disappointed that his A1c was 6.6.  He stated he has been volunteering at [employer] for last few months and has not been able to eat as healthy. He stated diet would get back to normal now since he eats healthier when home. He has attended CRP 2 twice and in agreement for referral back to [facility].  Pt very active and will resume walking and adding biking in near future. Voiced understanding of ed. Needs to see case manager. Graylon Good RN BSN 10/26/2018 10:36 AM

## 2018-10-26 NOTE — Telephone Encounter (Signed)
   TELEPHONE CALL NOTE  This patient has been deemed a candidate for follow-up tele-health visit to limit community exposure during the Covid-19 pandemic. I spoke with the patient via phone to discuss instructions. This has been outlined on the patient's AVS (dotphrase: [email or web address]). The patient was advised to review the section on consent for treatment as well. The patient will receive a phone call 2-3 days prior to their E-Visit at which time consent will be verbally confirmed. A Virtual Office Visit appointment type has been scheduled for 11/03/18 with Almyra Deforest, with "VIDEO" or "TELEPHONE" in the appointment notes - patient prefers TELEPHONE type.  I have either confirmed the patient is active in MyChart or offered to send sign-up link to phone/email via Mychart icon beside patient's photo.  Reino Bellis, NP 10/26/2018 11:46 AM

## 2018-11-01 ENCOUNTER — Telehealth: Payer: Self-pay | Admitting: Internal Medicine

## 2018-11-01 NOTE — Telephone Encounter (Signed)
Faxed cardiac rehab phase 2 form for medicaid patients to [phone]

## 2018-11-02 ENCOUNTER — Telehealth: Payer: Self-pay | Admitting: Physician Assistant

## 2018-11-03 ENCOUNTER — Telehealth: Payer: Self-pay

## 2018-11-03 ENCOUNTER — Encounter: Payer: Self-pay | Admitting: Physician Assistant

## 2018-11-03 ENCOUNTER — Telehealth (INDEPENDENT_AMBULATORY_CARE_PROVIDER_SITE_OTHER): Payer: Medicaid Other | Admitting: Physician Assistant

## 2018-11-03 VITALS — BP 122/77 | HR 57 | Ht 68.0 in | Wt 179.0 lb

## 2018-11-03 DIAGNOSIS — I10 Essential (primary) hypertension: Secondary | ICD-10-CM | POA: Diagnosis not present

## 2018-11-03 DIAGNOSIS — R0789 Other chest pain: Secondary | ICD-10-CM | POA: Diagnosis not present

## 2018-11-03 DIAGNOSIS — E785 Hyperlipidemia, unspecified: Secondary | ICD-10-CM

## 2018-11-03 DIAGNOSIS — I25119 Atherosclerotic heart disease of native coronary artery with unspecified angina pectoris: Secondary | ICD-10-CM | POA: Diagnosis not present

## 2018-11-03 DIAGNOSIS — E119 Type 2 diabetes mellitus without complications: Secondary | ICD-10-CM

## 2018-11-03 NOTE — Patient Instructions (Signed)
Medication Instructions:   RESTART Imdur 30 mg daily  If you need a refill on your cardiac medications before your next appointment, please call your pharmacy.   Lab work:  NONE ordered at this time of appointment   If you have labs (blood work) drawn today and your tests are completely normal, you will receive your results only by: Marland Kitchen MyChart Message (if you have MyChart) OR . A paper copy in the mail If you have any lab test that is abnormal or we need to change your treatment, we will call you to review the results.  Testing/Procedures:  NONE ordered at this time of appointment   Follow-Up: At [facility], you and your health needs are our priority.  As part of our continuing mission to provide you with exceptional heart care, we have created designated Provider Care Teams.  These Care Teams include your primary Cardiologist (physician) and Advanced Practice Providers (APPs -  Physician Assistants and Nurse Practitioners) who all work together to provide you with the care you need, when you need it. . You will need a follow up appointment in 2 weeks you will see Kenneth Casino, MD   Any Other Special Instructions Will Be Listed Below (If Applicable).

## 2018-11-03 NOTE — Progress Notes (Signed)
Virtual Visit via Telephone Note   This visit type was conducted due to national recommendations for restrictions regarding the COVID-19 Pandemic (e.g. social distancing) in an effort to limit this patient's exposure and mitigate transmission in our community.  Due to his co-morbid illnesses, this patient is at least at moderate risk for complications without adequate follow up.  This format is felt to be most appropriate for this patient at this time.  The patient did not have access to video technology/had technical difficulties with video requiring transitioning to audio format only (telephone).  All issues noted in this document were discussed and addressed.  No physical exam could be performed with this format.  Please refer to the patient's chart for his  consent to telehealth for [facility].   Date:  11/03/2018   ID:  Lawson Fiscal, DOB 03-14-1961, MRN [id number]  Patient Location: Home Provider Location: Home  PCP:  Azzie Glatter, FNP  Cardiologist:  Pixie Casino, MD  Electrophysiologist:  None   Evaluation Performed:  Follow-Up Visit  Chief Complaint:  Hospital followup  History of Present Illness:    [name] is a 58 y.o. male with past medical history of coronary artery disease, hypertension, dyslipidemia, DM 2 and microvascular angina who was recently admitted to [facility] on 10/17/2018 with chest pain.  His last cardiac catheterization was in 2016 which showed 40% proximal to mid left circumflex lesion, 45% first marginal lesion, 35% distal LAD lesion, patent stent in mid RCA and left circumflex.  Medical therapy was recommended.  Prior to that, he had cardiac catheterization performed in 2003, 2011, 2012, 2013 and eventually 2016.  His symptom on presentation during this hospitalization was reminiscent of previous angina.  He ultimately underwent cardiac catheterization which revealed 80% proximal left circumflex in-stent restenosis treated with balloon  angioplasty, 80% OM 2 in-stent restenosis also treated with balloon angioplasty, dual antiplatelet therapy is recommended for 1 year.  Then consider Plavix monotherapy after 1 year given multiple layers of stents.  Due to the tortuosity in the right subclavian artery, it is recommended against using right radial approach in the future.  His LDL during this admission was 77.  He has a history of statin intolerance however tolerated Livalo as outpatient.  Carvedilol was reduced to 6.25 mg twice daily during this admission secondary to bradycardia.  He was placed on losartan post-cath.  According to the patient, since the day after the cardiac catheterization he has been experiencing low-grade chest discomfort.  However this chest discomfort is fairly atypical and is not exacerbated by the degree of exertion.  He was able to ride his bicycle for 6 miles yesterday without worsening symptoms.  He also mentions his chest discomfort is fairly constant ever since the cardiac catheterization and has not went away since the balloon angioplasty.  He has been taking his sublingual nitroglycerin on a daily basis.  He denies any lower extremity edema, orthopnea or PND.  He has been compliant with aspirin and Brilinta.  I recommended him to resume previous Imdur at 30 mg daily.  Note, his amlodipine was taken off during the recent hospitalization as well.  This can be considered in the future.  I like to bring him back to the clinic in 2 weeks for reevaluation of the chest discomfort and also obtain a EKG.  The patient does not have symptoms concerning for COVID-19 infection (fever, chills, cough, or new shortness of breath).    Past Medical  History:  Diagnosis Date   Allergies    Arthritis    Chronic lower back pain    Coronary artery disease    a. Multiple prior caths/PCI. Cath 2013 with possible spasm of RCA, 70% ISR of mid LCx with subsequent DES to mLCx and prox LCX. b. H/o microvascular angina. c. Recurrent  angina 08/2014 - s/p PTCA/DES to prox Cx, PTCA/CBA to OM1.  c. LHC 06/10/15 with patent stents and some ISR in LCX and OM-1 that was not flow limiting --> Rx    Dyslipidemia    a. Intolerant to many statins except tolerating Livalo.   GERD (gastroesophageal reflux disease)    Hypertension    Myocardial infarction [facility]) ~ 2010   S/P angioplasty with stent, DES, to proximal and mid LCX 12/15/11 12/15/2011   Shoulder pain    Stroke ([place])    pt. reports had a stroke around time of MI 2010   Type II diabetes mellitus ([place])    Past Surgical History:  Procedure Laterality Date   CARDIAC CATHETERIZATION  06/15/2002   LAD with prox 40% stenosis, norma L main, Cfx with 25% lesion, RCA with long mid 25% stenosis (Dr. Vita Barley)   CARDIAC CATHETERIZATION  04/01/2010   normal L main, LAD wit mild stenosis, L Cfx with 70% in-stent restenosis, RCA with 70% in-stent restenosis, LVEF >60% (Dr. K. Mali Hilty) - cutting ballon arthrectomy to RCA & Cfx (Dr. Rockne Menghini)   CARDIAC CATHETERIZATION  08/25/2010   preserved global LV contractility; multivessel CAD, diffuse 90-95% in-stent restenosis in prox placed Cfx stent - cutting balloon arthrectomy in Cfx with multiple dilatations 90-95% to 0% stenosis (Dr. Corky Downs)   CARDIAC CATHETERIZATION  01/26/2011   PCI & stenting of aggresive in-stent restenosis within previously stented AV groove Cfx with 3.0x52m Taxus DES (previous stents were Promus) (Dr. JAdora Fridge   CARDIAC CATHETERIZATION  05/11/2011   preserved LV function, 40% mid LAD stenosis, 30-40% narrowing proximal to stented semgnet of prox Cfx, patent mid RCA stent with smooth 20% narrowing in distal RCA (Dr. TCorky Downs   CARDIAC CATHETERIZATION  12/15/2011   PCI & stenting of proximal & mid Cfx with DES - 3.0x175min proximal, 3.0x1565mn mid (Dr. J. Adora Fridge CARJacumbaA 06/10/2015   Procedure: Left Heart Cath and Coronary Angiography;  Surgeon: DanJolaine ArtistD;  Location:  [facility];  Service: Cardiovascular;  Laterality: N/A;   CARDIAC CATHETERIZATION  04/78/24/2353cardiometabolic testing  08/14/12/4315good exercise effort, peak VO2 79% predicted with normal VO2 HR curves (mild deconditioning)   COLONOSCOPY  12/2012   diminutive hyperplastic sigmoid poyp so repeat routine 2024   CORONARY BALLOON ANGIOPLASTY N/A 10/25/2018   Procedure: CORONARY BALLOON ANGIOPLASTY;  Surgeon: VarJettie BoozeD;  Location: [facility];  Service: Cardiovascular;  Laterality: N/A;   EXCISIONAL HEMORRHOIDECTOMY  1984   LEFT HEART CATH AND CORONARY ANGIOGRAPHY N/A 10/25/2018   Procedure: LEFT HEART CATH AND CORONARY ANGIOGRAPHY;  Surgeon: VarJettie BoozeD;  Location: [facility];  Service: Cardiovascular;  Laterality: N/A;   LEFT HEART CATHETERIZATION WITH CORONARY ANGIOGRAM N/A 05/11/2011   Procedure: LEFT HEART CATHETERIZATION WITH CORONARY ANGIOGRAM;  Surgeon: ThoTroy SineD;  [facility];  Service: Cardiovascular;  Laterality: N/A;  Possible percutaneous coronary intervention, possible IVUS   LEFT HEART CATHETERIZATION WITH CORONARY ANGIOGRAM N/A 12/15/2011   Procedure: LEFT HEART CATHETERIZATION WITH CORONARY ANGIOGRAM;  Surgeon:  Lorretta Harp, MD;  Location: [facility] CATH LAB;  Service: Cardiovascular;  Laterality: N/A;   LEFT HEART CATHETERIZATION WITH CORONARY ANGIOGRAM N/A 09/05/2014   Procedure: LEFT HEART CATHETERIZATION WITH CORONARY ANGIOGRAM;  Surgeon: Burnell Blanks, MD;  Location: [facility] CATH LAB;  Service: Cardiovascular;  Laterality: N/A;   LIPOMA EXCISION     back of the head   NM MYOCAR PERF WALL MOTION  02/2012   lexiscan myoview; mild perfusion defect in mid inferolateral & basal inferolateral region (infarct/scar); EF 52%, abnormal but ow risk scan   PERCUTANEOUS CORONARY STENT INTERVENTION (PCI-S)  09/05/2014   Procedure: PERCUTANEOUS CORONARY STENT INTERVENTION (PCI-S);  Surgeon: Burnell Blanks,  MD;  Location: [facility] CATH LAB;  Service: Cardiovascular;;     Current Meds  Medication Sig   ACCU-CHEK AVIVA PLUS test strip USE UP TO 3 TIMES A DAY   ACCU-CHEK FASTCLIX LANCETS MISC USE UP TO THREE TIMES A DAY   ARIPiprazole (ABILIFY) 2 MG tablet Take 1 tablet (2 mg total) by mouth at bedtime. For mood   aspirin 81 MG chewable tablet Chew 1 tablet (81 mg total) by mouth daily.   Blood Glucose Monitoring Suppl (ACCU-CHEK AVIVA PLUS) w/Device KIT USE UP TO THREE TIMES A DAY   carvedilol (COREG) 6.25 MG tablet Take 1 tablet (6.25 mg total) by mouth 2 (two) times daily with a meal.   cetirizine (ZYRTEC) 10 MG tablet Take 1 tablet (10 mg total) by mouth daily.   co-enzyme Q-10 30 MG capsule Take 30 mg by mouth daily.   fluticasone (FLONASE) 50 MCG/ACT nasal spray Place 1 spray into both nostrils daily. (Patient taking differently: Place 1 spray into both nostrils as needed. )   LIVALO 4 MG TABS Take 1 tablet by mouth every morning.    losartan (COZAAR) 25 MG tablet Take 1 tablet (25 mg total) by mouth daily.   metFORMIN (GLUCOPHAGE-XR) 500 MG 24 hr tablet Take 1 tablet (500 mg total) by mouth daily with breakfast. For diabetes management   mirtazapine (REMERON) 15 MG tablet Take 1 tablet (15 mg total) by mouth at bedtime. For mood   nitroGLYCERIN (NITROSTAT) 0.4 MG SL tablet Place 1 tablet (0.4 mg total) under the tongue every 5 (five) minutes x 3 doses as needed for chest pain.   omega-3 acid ethyl esters (LOVAZA) 1 g capsule Take 1 capsule (1 g total) by mouth 2 (two) times daily. For high cholesterol   ticagrelor (BRILINTA) 90 MG TABS tablet Take 1 tablet (90 mg total) by mouth 2 (two) times daily.     Allergies:   Bee venom; Shellfish allergy; Statins; and Testosterone cypionate   Social History   Tobacco Use   Smoking status: Former Smoker    Packs/day: 1.00    Years: 10.00    Pack years: 10.00    Types: Cigarettes    Last attempt to quit: 10/07/2018    Years since  quitting: 0.0   Smokeless tobacco: Never Used  Substance Use Topics   Alcohol use: Yes   Drug use: No    Comment: 12/15/11 "last cocaine was 2010"     Family Hx: The patient's family history includes Cancer in his brother and paternal grandfather; Coronary artery disease in his paternal grandmother; Leukemia in his mother; Prostate cancer in his father.  ROS:   Please see the history of present illness.     All other systems reviewed and are negative.   Prior CV studies:   The following studies  were reviewed today:  Cath 10/25/2018  Previously placed Mid RCA stent (unknown type) is widely patent.  Prox Cx lesion is 80% stenosed, instent restenosis.  Scoring balloon angioplasty was performed using a BALLOON WOLVERINE 3.50X10, followed by a 3.75 [place] balloon.  Post intervention, there is a 0% residual stenosis.  2nd Mrg lesion is 80% stenosed, instent restenosis.  Scoring balloon angioplasty was performed using a BALLOON WOLVERINE 3.50X10, followed by a 3.75 [place] balloon.  Post intervention, there is a 0% residual stenosis.  Mid LAD lesion is 25% stenosed.  Ost 2nd Mrg to 2nd Mrg lesion is 25% stenosed.  There is no aortic valve stenosis.  LV end diastolic pressure is normal.  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 Able to complete left circ PCI, but would not use right radial approach in the future.  Prox Cx to Mid Cx lesion is 20% stenosed.   Dual antiplatelet therapy for 12 months.    Consdier clopidogrel monotherapy after 1 year given the multiple layers of stents that he has.     Labs/Other Tests and Data Reviewed:    EKG:  An ECG dated 10/26/2018 was personally reviewed today and demonstrated:  Normal sinus rhythm without significant ST-T wave changes.  Recent Labs: 08/16/2018: ALT 37; TSH 1.014 10/26/2018: BUN 15; Creatinine, Ser 0.81; Hemoglobin 12.7; Platelets 211; Potassium 4.0; Sodium 140   Recent Lipid  Panel Lab Results  Component Value Date/Time   CHOL 143 10/26/2018 12:17 AM   CHOL 147 07/28/2017 08:54 AM   CHOL 226 (H) 02/15/2014 08:37 AM   TRIG 125 10/26/2018 12:17 AM   TRIG 208 (H) 02/15/2014 08:37 AM   HDL 41 10/26/2018 12:17 AM   HDL 48 07/28/2017 08:54 AM   HDL 42 02/15/2014 08:37 AM   CHOLHDL 3.5 10/26/2018 12:17 AM   LDLCALC 77 10/26/2018 12:17 AM   LDLCALC 85 07/28/2017 08:54 AM   LDLCALC 142 (H) 02/15/2014 08:37 AM    Wt Readings from Last 3 Encounters:  11/03/18 179 lb (81.2 kg)  10/26/18 188 lb (85.3 kg)  07/26/18 185 lb (83.9 kg)     Objective:    Vital Signs:  BP 122/77    Pulse (!) 57    Ht 5' 8"  (1.727 m)    Wt 179 lb (81.2 kg)    BMI 27.22 kg/m    VITAL SIGNS:  reviewed  ASSESSMENT & PLAN:    1. Atypical chest pain: Constant chest pain for days since cardiac catheterization and balloon angioplasty.  The degree of this chest pain seems to be very mild however patient is taking sublingual nitroglycerin on a daily basis.  His 10 mg daily of amlodipine was discontinued recently.  He says he has also tried isosorbide mononitrate in the past as well.  I recommend he restart Imdur at 30 mg daily.  Reassess his chest discomfort in the clinic with EKG in 2 weeks.  2. CAD: Recently underwent balloon angioplasty of in-stent restenosis in left circumflex and OM 2.  He has been compliant with aspirin and Brilinta.  3. Hypertension: Blood pressure stable on current therapy.  His lisinopril was switched to losartan during recent hospitalization.  Amlodipine was taken off.  His carvedilol was reduced in dosage due to bradycardia.  I have restarted his previous Imdur at 30 mg daily for antianginal purposes.  His chest pain is fairly atypical and may not be truly cardiac.  4. Hyperlipidemia: On Livalo.  Recent lab work shows LDL  of 77.  Continue diet and exercise.  5. DM 2: Managed by primary care provider   COVID-19 Education: The signs and symptoms of COVID-19 were  discussed with the patient and how to seek care for testing (follow up with PCP or arrange E-visit).  The importance of social distancing was discussed today.  Time:   Today, I have spent 19 minutes with the patient with telehealth technology discussing the above problems.     Medication Adjustments/Labs and Tests Ordered: Current medicines are reviewed at length with the patient today.  Concerns regarding medicines are outlined above.   Tests Ordered: No orders of the defined types were placed in this encounter.   Medication Changes: No orders of the defined types were placed in this encounter.   Disposition:  Follow up in 2 week(s)  Signed, Almyra Deforest, PA  11/03/2018 9:18 AM    [place]

## 2018-11-03 NOTE — Telephone Encounter (Signed)
Virtual Visit Pre-Appointment Phone Call  "(Name), I am calling you today to discuss your upcoming appointment. We are currently trying to limit exposure to the virus that causes COVID-19 by seeing patients at home rather than in the office."  1. "What is the BEST phone number to call the day of the visit?" - include this in appointment notes  2. "Do you have or have access to (through a family member/friend) a smartphone with video capability that we can use for your visit?" a. If yes - list this number in appt notes as "cell" (if different from BEST phone #) and list the appointment type as a VIDEO visit in appointment notes b. If no - list the appointment type as a PHONE visit in appointment notes  3. Confirm consent - "In the setting of the current Covid19 crisis, you are scheduled for a PHONE visit with your provider on 11/03/2018 at 9:00AM.  Just as we do with many in-office visits, in order for you to participate in this visit, we must obtain consent.  If you'd like, I can send this to your mychart (if signed up) or email for you to review.  Otherwise, I can obtain your verbal consent now.  All virtual visits are billed to your insurance company just like a normal visit would be.  By agreeing to a virtual visit, we'd like you to understand that the technology does not allow for your provider to perform an examination, and thus may limit your provider's ability to fully assess your condition. If your provider identifies any concerns that need to be evaluated in person, we will make arrangements to do so.  Finally, though the technology is pretty good, we cannot assure that it will always work on either your or our end, and in the setting of a video visit, we may have to convert it to a phone-only visit.  In either situation, we cannot ensure that we have a secure connection.  Are you willing to proceed?" STAFF: Did the patient verbally acknowledge consent to telehealth visit? Document YES/NO  here: YES  4. Advise patient to be prepared - "Two hours prior to your appointment, go ahead and check your blood pressure, pulse, oxygen saturation, and your weight (if you have the equipment to check those) and write them all down. When your visit starts, your provider will ask you for this information. If you have an Apple Watch or Kardia device, please plan to have heart rate information ready on the day of your appointment. Please have a pen and paper handy nearby the day of the visit as well."  5. Give patient instructions for MyChart download to smartphone OR Doximity/Doxy.me as below if video visit (depending on what platform provider is using)  6. Inform patient they will receive a phone call 15 minutes prior to their appointment time (may be from unknown caller ID) so they should be prepared to answer    TELEPHONE CALL NOTE  TRESTAN VAHLE has been deemed a candidate for a follow-up tele-health visit to limit community exposure during the Covid-19 pandemic. I spoke with the patient via phone to ensure availability of phone/video source, confirm preferred email & phone number, and discuss instructions and expectations.  I reminded [name] to be prepared with any vital sign and/or heart rhythm information that could potentially be obtained via home monitoring, at the time of his visit. I reminded ILIJA MAXIM to expect a phone call prior to his visit.  Jacqulynn Cadet, Tina 11/03/2018 9:44 AM   INSTRUCTIONS FOR DOWNLOADING THE MYCHART APP TO SMARTPHONE  - The patient must first make sure to have activated MyChart and know their login information - If Apple, go to [employer] and type in MyChart in the search bar and download the app. If Android, ask patient to go to [employer] and type in [place] in the search bar and download the app. The app is free but as with any other app downloads, their phone may require them to verify saved payment information or Apple/Android  password.  - The patient will need to then log into the app with their MyChart username and password, and select [facility] as their healthcare provider to link the account. When it is time for your visit, go to the MyChart app, find appointments, and click Begin Video Visit. Be sure to Select Allow for your device to access the Microphone and Camera for your visit. You will then be connected, and your provider will be with you shortly.  **If they have any issues connecting, or need assistance please contact MyChart service desk [phone])**  **If using a computer, in order to ensure the best quality for their visit they will need to use either of the following Internet Browsers: [employer], or Google Chrome**  IF USING DOXIMITY or DOXY.ME - The patient will receive a link just prior to their visit by text.     FULL LENGTH CONSENT FOR TELE-HEALTH VISIT   I hereby voluntarily request, consent and authorize [facility] and its employed or contracted physicians, physician assistants, nurse practitioners or other licensed health care professionals (the Practitioner), to provide me with telemedicine health care services (the "Services") as deemed necessary by the treating Practitioner. I acknowledge and consent to receive the Services by the Practitioner via telemedicine. I understand that the telemedicine visit will involve communicating with the Practitioner through live audiovisual communication technology and the disclosure of certain medical information by electronic transmission. I acknowledge that I have been given the opportunity to request an in-person assessment or other available alternative prior to the telemedicine visit and am voluntarily participating in the telemedicine visit.  I understand that I have the right to withhold or withdraw my consent to the use of telemedicine in the course of my care at any time, without affecting my right to future care or treatment,  and that the Practitioner or I may terminate the telemedicine visit at any time. I understand that I have the right to inspect all information obtained and/or recorded in the course of the telemedicine visit and may receive copies of available information for a reasonable fee.  I understand that some of the potential risks of receiving the Services via telemedicine include:  Marland Kitchen Delay or interruption in medical evaluation due to technological equipment failure or disruption; . Information transmitted may not be sufficient (e.g. poor resolution of images) to allow for appropriate medical decision making by the Practitioner; and/or  . In rare instances, security protocols could fail, causing a breach of personal health information.  Furthermore, I acknowledge that it is my responsibility to provide information about my medical history, conditions and care that is complete and accurate to the best of my ability. I acknowledge that Practitioner's advice, recommendations, and/or decision may be based on factors not within their control, such as incomplete or inaccurate data provided by me or distortions of diagnostic images or specimens that may result from electronic transmissions. I understand that the  practice of medicine is not an Chief Strategy Officer and that Practitioner makes no warranties or guarantees regarding treatment outcomes. I acknowledge that I will receive a copy of this consent concurrently upon execution via email to the email address I last provided but may also request a printed copy by calling the office of [facility].    I understand that my insurance will be billed for this visit.   I have read or had this consent read to me. . I understand the contents of this consent, which adequately explains the benefits and risks of the Services being provided via telemedicine.  . I have been provided ample opportunity to ask questions regarding this consent and the Services and have had my questions  answered to my satisfaction. . I give my informed consent for the services to be provided through the use of telemedicine in my medical care  By participating in this telemedicine visit I agree to the above.

## 2018-11-03 NOTE — Telephone Encounter (Signed)
Left a voice message for the patient reminding him of his appointment with Almyra Deforest, PA-C this morning

## 2018-11-04 ENCOUNTER — Other Ambulatory Visit: Payer: Self-pay

## 2018-11-04 ENCOUNTER — Emergency Department (HOSPITAL_COMMUNITY)
Admission: EM | Admit: 2018-11-04 | Discharge: 2018-11-04 | Disposition: A | Discharge status: Home / Self Care | Payer: Medicaid Other | Attending: Emergency Medicine | Admitting: Emergency Medicine

## 2018-11-04 ENCOUNTER — Emergency Department (HOSPITAL_COMMUNITY): Payer: Medicaid Other

## 2018-11-04 ENCOUNTER — Encounter (HOSPITAL_COMMUNITY): Payer: Self-pay | Admitting: Emergency Medicine

## 2018-11-04 DIAGNOSIS — Z79899 Other long term (current) drug therapy: Secondary | ICD-10-CM | POA: Insufficient documentation

## 2018-11-04 DIAGNOSIS — I2510 Atherosclerotic heart disease of native coronary artery without angina pectoris: Secondary | ICD-10-CM | POA: Diagnosis not present

## 2018-11-04 DIAGNOSIS — Z87891 Personal history of nicotine dependence: Secondary | ICD-10-CM | POA: Diagnosis not present

## 2018-11-04 DIAGNOSIS — I10 Essential (primary) hypertension: Secondary | ICD-10-CM | POA: Insufficient documentation

## 2018-11-04 DIAGNOSIS — Z8673 Personal history of transient ischemic attack (TIA), and cerebral infarction without residual deficits: Secondary | ICD-10-CM | POA: Insufficient documentation

## 2018-11-04 DIAGNOSIS — E119 Type 2 diabetes mellitus without complications: Secondary | ICD-10-CM | POA: Diagnosis not present

## 2018-11-04 DIAGNOSIS — R079 Chest pain, unspecified: Secondary | ICD-10-CM | POA: Insufficient documentation

## 2018-11-04 LAB — COMPREHENSIVE METABOLIC PANEL
ALT: 27 U/L (ref 0–44)
AST: 22 U/L (ref 15–41)
Albumin: 4 g/dL (ref 3.5–5.0)
Alkaline Phosphatase: 56 U/L (ref 38–126)
Anion gap: 11 (ref 5–15)
BUN: 12 mg/dL (ref 6–20)
CO2: 24 mmol/L (ref 22–32)
Calcium: 9.4 mg/dL (ref 8.9–10.3)
Chloride: 103 mmol/L (ref 98–111)
Creatinine, Ser: 0.86 mg/dL (ref 0.61–1.24)
GFR calc Af Amer: >60 mL/min (ref >60)
GFR calc non Af Amer: >60 mL/min (ref >60)
Glucose, Bld: 106 mg/dL — ABNORMAL HIGH (ref 70–99)
Potassium: 3.9 mmol/L (ref 3.5–5.1)
Sodium: 138 mmol/L (ref 135–145)
Total Bilirubin: 0.7 mg/dL (ref 0.3–1.2)
Total Protein: 6 g/dL — ABNORMAL LOW (ref 6.5–8.1)

## 2018-11-04 LAB — TROPONIN I
Troponin I: <0.03 ng/mL (ref <0.03)
Troponin I: <0.03 ng/mL (ref <0.03)

## 2018-11-04 LAB — CBC
HCT: 36.5 % — ABNORMAL LOW (ref 39.0–52.0)
Hemoglobin: 12.4 g/dL — ABNORMAL LOW (ref 13.0–17.0)
MCH: 28.8 pg (ref 26.0–34.0)
MCHC: 34 g/dL (ref 30.0–36.0)
MCV: 84.9 fL (ref 80.0–100.0)
Platelets: 228 10*3/uL (ref 150–400)
RBC: 4.3 MIL/uL (ref 4.22–5.81)
RDW: 12.4 % (ref 11.5–15.5)
WBC: 4.4 10*3/uL (ref 4.0–10.5)
nRBC: 0 % (ref 0.0–0.2)

## 2018-11-04 LAB — CBG MONITORING, ED: Glucose-Capillary: 100 mg/dL — ABNORMAL HIGH (ref 70–99)

## 2018-11-04 MED ORDER — ISOSORBIDE MONONITRATE ER 30 MG PO TB24: 60.0000 mg | ORAL_TABLET | Freq: Every day | ORAL | 0 refills | Status: DC

## 2018-11-04 MED ORDER — METOPROLOL TARTRATE 25 MG PO TABS: 50.0000 mg | ORAL_TABLET | Freq: Two times a day (BID) | ORAL | 0 refills | Status: DC

## 2018-11-04 MED ORDER — ASPIRIN 81 MG PO CHEW: 324.0000 mg | CHEWABLE_TABLET | Freq: Once | ORAL | Status: DC

## 2018-11-04 NOTE — ED Provider Notes (Signed)
Emergency Department Provider Note   I have reviewed the triage vital signs and the nursing notes.   HISTORY  Chief Complaint Chest Pain   HPI [name] is a 58 y.o. male with significant known coronary artery disease recently gotten a balloon angioplasty of to in-stent thrombosis o at the end of April.  Went home and has had persistent slightly worsening chest pain since that time.  Was seen by cardiology as an ED visit on May 7 and started on Imdur.  They told him if it did not get better to come to the emergency department which she has.  Has no associate shortness of breath, nausea, vomiting, diaphoresis or dizziness.  Does not radiate.  Does not related to eating, movement, coughing or breathing.  No lower extremity swelling.   No other associated or modifying symptoms.    Past Medical History:  Diagnosis Date   Allergies    Arthritis    Chronic lower back pain    Coronary artery disease    a. Multiple prior caths/PCI. Cath 2013 with possible spasm of RCA, 70% ISR of mid LCx with subsequent DES to mLCx and prox LCX. b. H/o microvascular angina. c. Recurrent angina 08/2014 - s/p PTCA/DES to prox Cx, PTCA/CBA to OM1.  c. LHC 06/10/15 with patent stents and some ISR in LCX and OM-1 that was not flow limiting --> Rx    Dyslipidemia    a. Intolerant to many statins except tolerating Livalo.   GERD (gastroesophageal reflux disease)    Hypertension    Myocardial infarction ([facility]) ~ 2010   S/P angioplasty with stent, DES, to proximal and mid LCX 12/15/11 12/15/2011   Shoulder pain    Stroke ([place] [facility])    pt. 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   Influenza A 07/17/2017   Lactic acidosis    Cough    Nausea and vomiting    Severe episode of recurrent  major depressive disorder, without psychotic features ([place])    MDD (major depressive disorder), single episode, severe with psychotic features ([place]) 06/27/2017   MDD (major depressive disorder), severe ([place]) 06/23/2017   Essential hypertension 01/13/2016   Coronary artery disease involving native coronary artery of native heart without angina pectoris 01/13/2016   Unstable angina ([name])    ED (erectile dysfunction) 02/15/2014   Allergy to contrast media (used for diagnostic x-rays) 05/12/2011   Hypertension 05/09/2011   Hyperlipidemia 05/09/2011   DM2 (diabetes mellitus, type 2) ([place] [facility]) 07/10/2010   CAD S/P percutaneous coronary angioplasty 07/10/2010   DYSPEPSIA&OTHER SPEC DISORDERS FUNCTION STOMACH 07/10/2010    Past Surgical History:  Procedure Laterality Date   CARDIAC CATHETERIZATION  06/15/2002   LAD with prox 40% stenosis, norma L main, Cfx with 25% lesion, RCA with long mid 25% stenosis (Dr. Vita Barley)   CARDIAC CATHETERIZATION  04/01/2010   normal L main, LAD wit mild stenosis, L Cfx with 70% in-stent restenosis, RCA with 70% in-stent restenosis, LVEF >60% (Dr. K. Mali Hilty) - cutting ballon arthrectomy to RCA & Cfx (Dr. Rockne Menghini)   CARDIAC CATHETERIZATION  08/25/2010   preserved global LV contractility; multivessel CAD, diffuse 90-95% in-stent restenosis in prox placed Cfx stent - cutting balloon arthrectomy in Cfx with multiple dilatations 90-95% to 0% stenosis (Dr.  T. Claiborne Billings)   CARDIAC CATHETERIZATION  01/26/2011   PCI & stenting of aggresive in-stent restenosis within previously stented AV groove Cfx with 3.0x6mm Taxus DES (previous stents were Promus) (Dr. Adora Fridge)   CARDIAC CATHETERIZATION  05/11/2011   preserved LV function, 40% mid LAD stenosis, 30-40% narrowing proximal to stented semgnet of prox Cfx, patent mid RCA stent with smooth 20% narrowing in distal RCA (Dr. Corky Downs)   CARDIAC CATHETERIZATION  12/15/2011   PCI & stenting of proximal & mid Cfx  with DES - 3.0x52mm in proximal, 3.0x67mm in mid (Dr. Adora Fridge)   Lane N/A 06/10/2015   Procedure: Left Heart Cath and Coronary Angiography;  Surgeon: Jolaine Artist, MD;  Location: [facility];  Service: Cardiovascular;  Laterality: N/A;   CARDIAC CATHETERIZATION  91/47/8295   cardiometabolic testing  12/17/3084   good exercise effort, peak VO2 79% predicted with normal VO2 HR curves (mild deconditioning)   COLONOSCOPY  12/2012   diminutive hyperplastic sigmoid poyp so repeat routine 2024   CORONARY BALLOON ANGIOPLASTY N/A 10/25/2018   Procedure: CORONARY BALLOON ANGIOPLASTY;  Surgeon: Jettie Booze, MD;  Location: [facility];  Service: Cardiovascular;  Laterality: N/A;   EXCISIONAL HEMORRHOIDECTOMY  1984   LEFT HEART CATH AND CORONARY ANGIOGRAPHY N/A 10/25/2018   Procedure: LEFT HEART CATH AND CORONARY ANGIOGRAPHY;  Surgeon: Jettie Booze, MD;  Location: [facility];  Service: Cardiovascular;  Laterality: N/A;   LEFT HEART CATHETERIZATION WITH CORONARY ANGIOGRAM N/A 05/11/2011   Procedure: LEFT HEART CATHETERIZATION WITH CORONARY ANGIOGRAM;  Surgeon: Troy Sine, MD;  Location: [facility] CATH LAB;  Service: Cardiovascular;  Laterality: N/A;  Possible percutaneous coronary intervention, possible IVUS   LEFT HEART CATHETERIZATION WITH CORONARY ANGIOGRAM N/A 12/15/2011   Procedure: LEFT HEART CATHETERIZATION WITH CORONARY ANGIOGRAM;  Surgeon: Lorretta Harp, MD;  Location: [facility] CATH LAB;  Service: Cardiovascular;  Laterality: N/A;   LEFT HEART CATHETERIZATION WITH CORONARY ANGIOGRAM N/A 09/05/2014   Procedure: LEFT HEART CATHETERIZATION WITH CORONARY ANGIOGRAM;  Surgeon: Burnell Blanks, MD;  Location: [facility] CATH LAB;  Service: Cardiovascular;  Laterality: N/A;   LIPOMA EXCISION     back of the head   NM MYOCAR PERF WALL MOTION  02/2012   lexiscan myoview; mild perfusion defect in mid inferolateral & basal inferolateral region  (infarct/scar); EF 52%, abnormal but ow risk scan   PERCUTANEOUS CORONARY STENT INTERVENTION (PCI-S)  09/05/2014   Procedure: PERCUTANEOUS CORONARY STENT INTERVENTION (PCI-S);  Surgeon: Burnell Blanks, MD;  Location: [facility] CATH LAB;  Service: Cardiovascular;;    Current Outpatient Rx   Order #: [id number] Class: Historical Med   Order #: [id number] Class: Historical Med   Order #: [id number] Class: Normal   Order #: [id number] Class: Normal   Order #: [id number] Class: Historical Med   Order #: [id number] Class: Normal   Order #: [id number] Class: Historical Med   Order #: [id number] Class: Print   Order #: [id number] Class: Normal   Order #: [id number] Class: Historical Med   Order #: [id number] Class: Normal   Order #: [id number] Class: Normal   Order #: [id number] Class: Normal   Order #: [id number] Class: Normal   Order #: [id number] Class: Normal   Order #: [id number] Class: Normal    Allergies Bee venom; Shellfish allergy; Statins; and Testosterone cypionate  Family History  Problem Relation Age of Onset   Leukemia Mother    Prostate cancer Father    Coronary artery disease Paternal Grandmother    Cancer Paternal Grandfather    Cancer Brother  Social History Social History   Tobacco Use   Smoking status: Former Smoker    Packs/day: 1.00    Years: 10.00    Pack years: 10.00    Types: Cigarettes    Last attempt to quit: 10/07/2018    Years since quitting: 0.0   Smokeless tobacco: Never Used  Substance Use Topics   Alcohol use: Yes   Drug use: No    Comment: 12/15/11 "last cocaine was 2010"    Review of Systems  All other systems negative except as documented in the HPI. All pertinent positives and negatives as reviewed in the HPI. ____________________________________________   PHYSICAL EXAM:  VITAL SIGNS: ED Triage Vitals  Enc Vitals Group     BP 11/04/18 0114 113/77     Pulse Rate 11/04/18 0114 66     Resp 11/04/18 0114 14     Temp 11/04/18 0114  97.9 F (36.6 C)     Temp Source 11/04/18 0114 Oral     SpO2 11/04/18 0114 95 %     Weight 11/04/18 0115 185 lb (83.9 kg)     Height 11/04/18 0115 5\' 8"  (1.727 m)    Constitutional: Alert and oriented. Well appearing and in no acute distress. Eyes: Conjunctivae are normal. PERRL. EOMI. Head: Atraumatic. Nose: No congestion/rhinnorhea. Mouth/Throat: Mucous membranes are moist.  Oropharynx non-erythematous. Neck: No stridor.  No meningeal signs.   Cardiovascular: Normal rate, regular rhythm. Good peripheral circulation. Grossly normal heart sounds.   Respiratory: Normal respiratory effort.  No retractions. Lungs CTAB. Gastrointestinal: Soft and nontender. No distention.  Musculoskeletal: No lower extremity tenderness nor edema. No gross deformities of extremities. Neurologic:  Normal speech and language. No gross focal neurologic deficits are appreciated.  Skin:  Skin is warm, dry and intact. No rash noted.   ____________________________________________   LABS (all labs ordered are listed, but only abnormal results are displayed)  Labs Reviewed  COMPREHENSIVE METABOLIC PANEL - Abnormal; Notable for the following components:      Result Value   Glucose, Bld 106 (*)    Total Protein 6.0 (*)    All other components within normal limits  CBC - Abnormal; Notable for the following components:   Hemoglobin 12.4 (*)    HCT 36.5 (*)    All other components within normal limits  CBG MONITORING, ED - Abnormal; Notable for the following components:   Glucose-Capillary 100 (*)    All other components within normal limits  TROPONIN I  TROPONIN I   ____________________________________________  EKG   EKG Interpretation  Date/Time:  Friday Nov 04 2018 01:10:51 EDT Ventricular Rate:  65 PR Interval:    QRS Duration: 98 QT Interval:  388 QTC Calculation: 404 R Axis:   18 Text Interpretation:  Sinus rhythm Abnormal R-wave progression, early transition No significant change since  last tracing 4/29 Confirmed by Merrily Pew [phone]) on 11/04/2018 1:16:31 AM       EKG Interpretation  Date/Time:  Friday Nov 04 2018 04:23:28 EDT Ventricular Rate:  70 PR Interval:    QRS Duration: 127 QT Interval:  380 QTC Calculation: 410 R Axis:   30 Text Interpretation:  Sinus rhythm Nonspecific intraventricular conduction delay No significant change since last tracing Since last tracing of earlier today Confirmed by Merrily Pew [phone]) on 11/04/2018 4:27:49 AM        ____________________________________________  RADIOLOGY  Dg Chest 2 View  Result Date: 11/04/2018 CLINICAL DATA:  Chest pain EXAM: CHEST - 2 VIEW COMPARISON:  10/25/2018 FINDINGS:  Heart and mediastinal contours are within normal limits. No focal opacities or effusions. No acute bony abnormality. Dense coronary artery calcifications. IMPRESSION: No active cardiopulmonary disease. Electronically Signed   By: Rolm Baptise M.D.   On: 11/04/2018 02:41    ____________________________________________   PROCEDURES  Procedure(s) performed:   Procedures   ____________________________________________   INITIAL IMPRESSION / ASSESSMENT AND PLAN / ED COURSE ECG unchanged.  Does not seem consistent with pulmonary embolus, aortic dissection, pneumothorax, infectious causes.  Secondary to his significant coronary artery disease history we will discuss with cardiology when labs result.  Discussed with cardiologist, Dr. Kalman Shan, who after review of the patient's recent visits, labs, EKGs and medications feels like this is likely microvascular ischemia as he does not have any large troponin or EKG changes.  Recommended switching his Coreg to metoprolol 25 twice daily, discontinuing losartan and increase his Imdur to 60 daily and calling his cardiologist in the morning for repeat visit.  Does recommend get a second troponin to ensure that he rules out for ACS.  Work-up unremarkable.  Medication changes implemented.  Discussed  with patient.  Will call cardiologist for appointment.  Also discuss side effects of medications.  Stable for discharge  Pertinent labs & imaging results that were available during my care of the patient were reviewed by me and considered in my medical decision making (see chart for details).  A medical screening exam was performed and I feel the patient has had an appropriate workup for their chief complaint at this time and likelihood of emergent condition existing is low. They have been counseled on decision, discharge, follow up and which symptoms necessitate immediate return to the emergency department. They or their family verbally stated understanding and agreement with plan and discharged in stable condition.   ____________________________________________  FINAL CLINICAL IMPRESSION(S) / ED DIAGNOSES  Final diagnoses:  Chest pain, unspecified type     MEDICATIONS GIVEN DURING THIS VISIT:  Medications - No data to display   NEW OUTPATIENT MEDICATIONS STARTED DURING THIS VISIT:  Discharge Medication List as of 11/04/2018  5:20 AM    START taking these medications   Details  isosorbide mononitrate (IMDUR) 30 MG 24 hr tablet Take 2 tablets (60 mg total) by mouth daily., Starting Fri 11/04/2018, Normal    metoprolol tartrate (LOPRESSOR) 25 MG tablet Take 2 tablets (50 mg total) by mouth 2 (two) times daily., Starting Fri 11/04/2018, Normal        Note:  This note was prepared with assistance of Dragon voice recognition software. Occasional wrong-word or sound-a-like substitutions may have occurred due to the inherent limitations of voice recognition software.   Jaley Yan, Corene Cornea, MD 11/04/18 [phone]

## 2018-11-04 NOTE — ED Notes (Signed)
ED Provider at bedside. 

## 2018-11-04 NOTE — ED Notes (Signed)
Patient transported to X-ray 

## 2018-11-04 NOTE — ED Triage Notes (Signed)
Per EMS, pt from home has had right sided burning/non-radiating chest pain X 9 days (on and off), more persistent the past two days, 9/10 pain).  No other symptoms, denies fever, SOB, diaphoresis, dizziness.  virtual visit today w/ cardiac MD, who changed his medications but told him to call EMS if it did not stop.  Was given 324 ASA, 1 nitro (decreased pain to 4/10)  120/62, CBG 106.

## 2018-11-15 ENCOUNTER — Other Ambulatory Visit: Payer: Self-pay

## 2018-11-15 ENCOUNTER — Encounter: Payer: Self-pay | Admitting: Internal Medicine

## 2018-11-15 ENCOUNTER — Ambulatory Visit: Payer: Medicaid Other | Admitting: Internal Medicine

## 2018-11-15 ENCOUNTER — Telehealth: Payer: Self-pay | Admitting: Internal Medicine

## 2018-11-15 VITALS — BP 124/82 | HR 63 | Ht 68.0 in | Wt 191.4 lb

## 2018-11-15 DIAGNOSIS — I10 Essential (primary) hypertension: Secondary | ICD-10-CM | POA: Diagnosis not present

## 2018-11-15 DIAGNOSIS — I25119 Atherosclerotic heart disease of native coronary artery with unspecified angina pectoris: Secondary | ICD-10-CM

## 2018-11-15 DIAGNOSIS — E785 Hyperlipidemia, unspecified: Secondary | ICD-10-CM

## 2018-11-15 DIAGNOSIS — R0789 Other chest pain: Secondary | ICD-10-CM

## 2018-11-15 MED ORDER — IBUPROFEN 600 MG PO TABS: 600.0000 mg | ORAL_TABLET | Freq: Two times a day (BID) | ORAL | 0 refills | Status: DC

## 2018-11-15 MED ORDER — METOPROLOL TARTRATE 50 MG PO TABS: 50.0000 mg | ORAL_TABLET | Freq: Two times a day (BID) | ORAL | 3 refills | Status: DC

## 2018-11-15 MED ORDER — ISOSORBIDE MONONITRATE ER 30 MG PO TB24: 30.0000 mg | ORAL_TABLET | Freq: Every day | ORAL | 0 refills | Status: DC

## 2018-11-15 NOTE — Telephone Encounter (Signed)
Patient called stating he had visit today with Dr. Debara Pickett and on his AVS is still has his is to take omega-3 acid ethyl esters (LOVAZA) 1 g capsule, however he states Dr. Debara Pickett and him discussed coming off this medication.  He wants to make sure he should stop taking this.
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
## 2018-11-15 NOTE — Telephone Encounter (Signed)
Per MD, patient should HOLD lovaza and recheck LIPID PANEL in 3 months

## 2018-11-15 NOTE — Patient Instructions (Signed)
Medication Instructions:  DECREASE isosorbide mononitrate (imdur) to 30mg  daily Continue metoprolol tartrate 50mg  twice daily  START ibuprofen 600mg  twice daily - take for 2 weeks If you need a refill on your cardiac medications before your next appointment, please call your pharmacy.   Follow-Up: At [facility], you and your health needs are our priority.  As part of our continuing mission to provide you with exceptional heart care, we have created designated Provider Care Teams.  These Care Teams include your primary Cardiologist (physician) and Advanced Practice Providers (APPs -  Physician Assistants and Nurse Practitioners) who all work together to provide you with the care you need, when you need it. You will need a follow up appointment in 3 months.  You may see Pixie Casino, MD or one of the following Advanced Practice Providers on your designated Care Team: [place], [place] . Fabian Sharp, PA-C  Any Other Special Instructions Will Be Listed Below (If Applicable).

## 2018-11-17 ENCOUNTER — Telehealth (HOSPITAL_COMMUNITY): Payer: Self-pay | Admitting: *Deleted

## 2018-11-17 NOTE — Telephone Encounter (Signed)
Called and left message requesting call back in regards to options for cardiac rehab. Contact information provided.  Cherre Huger, BSN Cardiac and Training and development officer

## 2018-11-17 NOTE — Telephone Encounter (Signed)
LM for patient with instructions per MD noted below and in MD note. Medication list updated. Lipid panel ordered & mailed. MyChart message sent with instructions

## 2018-11-23 ENCOUNTER — Telehealth (HOSPITAL_COMMUNITY): Payer: Self-pay

## 2018-11-23 NOTE — Telephone Encounter (Signed)
Pt returned CR phone call, advise pt of our Virtual Cardiac Rehab. Pt stated he is unable/comfortable with downloading apps and would like to re'cve educational handouts for exercise and heart healthy nutrition.   Verified address on file is correct.

## 2018-11-24 ENCOUNTER — Telehealth: Payer: Self-pay

## 2018-11-24 NOTE — Telephone Encounter (Signed)
Patient was negative for the COV-19 questions. Patient will be coming to office

## 2018-11-25 ENCOUNTER — Ambulatory Visit: Payer: Self-pay | Admitting: Family Medicine

## 2018-11-28 ENCOUNTER — Other Ambulatory Visit: Payer: Self-pay | Admitting: Family Medicine

## 2018-11-28 DIAGNOSIS — R7303 Prediabetes: Secondary | ICD-10-CM

## 2018-12-22 ENCOUNTER — Telehealth (HOSPITAL_COMMUNITY): Payer: Self-pay

## 2018-12-22 NOTE — Telephone Encounter (Signed)
Pt insurance is active and benefits verified through Medicaid. Co-pay $0.00, DED $0.00/$0.00 met, out of pocket $0.00/$0.00 met, co-insurance 0%. No pre-authorization required. Passport, 12/22/2018 @ 3:49PM, REF# [id number]

## 2018-12-27 NOTE — Telephone Encounter (Signed)
Open n error °

## 2019-01-04 ENCOUNTER — Telehealth (HOSPITAL_COMMUNITY): Payer: Self-pay

## 2019-01-04 NOTE — Telephone Encounter (Signed)
Cardiac Rehab Medication Review by a Pharmacist  Does the patient  feel that his/her medications are working for him/her?  yes   Has the patient been experiencing any side effects to the medications prescribed?  No  Does the patient measure his/her own blood pressure or blood glucose at home?  Not assessed during this telephone encounter.   Does the patient have any problems obtaining medications due to transportation or finances?   no  Understanding of regimen: excellent Understanding of indications: excellent Potential of compliance: excellent  Pharmacist comments: N/A  Cristela Felt, PharmD PGY1 Pharmacy Resident [employer]: [phone]

## 2019-01-05 ENCOUNTER — Ambulatory Visit (HOSPITAL_COMMUNITY): Payer: Medicaid Other

## 2019-01-09 ENCOUNTER — Encounter (HOSPITAL_COMMUNITY): Payer: Medicaid Other

## 2019-01-09 ENCOUNTER — Telehealth (HOSPITAL_COMMUNITY): Payer: Self-pay | Admitting: *Deleted

## 2019-01-09 NOTE — Telephone Encounter (Signed)
PC to pt for history interview prior to cardiac rehab orienation.  Pt states he needs to cancel appointment as he "thinks I'm coming down with a cold."  Pt states he has had a fever and a cough.  RN audibly heard pt coughing on phone numerous times.  Pt advised to contact PCP with symptoms immediately.  Pt verbalized understanding. Pt will be rescheduled for cardiac rehab orientation at a later date.

## 2019-01-10 ENCOUNTER — Ambulatory Visit (HOSPITAL_COMMUNITY): Payer: Medicaid Other

## 2019-01-11 ENCOUNTER — Encounter (HOSPITAL_COMMUNITY): Payer: Medicaid Other

## 2019-01-12 ENCOUNTER — Encounter (HOSPITAL_COMMUNITY): Payer: Self-pay

## 2019-01-12 ENCOUNTER — Inpatient Hospital Stay (HOSPITAL_COMMUNITY)
Admission: RE | Admit: 2019-01-12 | Discharge: 2019-01-16 | DRG: 881 | Disposition: A | Discharge status: Home / Self Care | Payer: Medicaid Other | Attending: Psychiatry | Admitting: Psychiatry

## 2019-01-12 ENCOUNTER — Other Ambulatory Visit: Payer: Self-pay

## 2019-01-12 ENCOUNTER — Other Ambulatory Visit: Payer: Self-pay | Admitting: Behavioral Health

## 2019-01-12 DIAGNOSIS — I10 Essential (primary) hypertension: Secondary | ICD-10-CM | POA: Diagnosis present

## 2019-01-12 DIAGNOSIS — Z1159 Encounter for screening for other viral diseases: Secondary | ICD-10-CM | POA: Diagnosis not present

## 2019-01-12 DIAGNOSIS — F1010 Alcohol abuse, uncomplicated: Secondary | ICD-10-CM | POA: Diagnosis present

## 2019-01-12 DIAGNOSIS — R45851 Suicidal ideations: Secondary | ICD-10-CM | POA: Diagnosis present

## 2019-01-12 DIAGNOSIS — F329 Major depressive disorder, single episode, unspecified: Principal | ICD-10-CM | POA: Diagnosis present

## 2019-01-12 DIAGNOSIS — Z87891 Personal history of nicotine dependence: Secondary | ICD-10-CM | POA: Diagnosis not present

## 2019-01-12 DIAGNOSIS — E119 Type 2 diabetes mellitus without complications: Secondary | ICD-10-CM | POA: Diagnosis present

## 2019-01-12 LAB — SARS CORONAVIRUS 2 BY RT PCR (HOSPITAL ORDER, PERFORMED IN ~~LOC~~ HOSPITAL LAB): SARS Coronavirus 2: NEGATIVE

## 2019-01-12 MED ORDER — ALUM & MAG HYDROXIDE-SIMETH 200-200-20 MG/5ML PO SUSP: 30.0000 mL | ORAL | Status: DC | PRN

## 2019-01-12 MED ORDER — MIRTAZAPINE 15 MG PO TABS
15.0000 mg | ORAL_TABLET | Freq: Every day | ORAL | Status: DC
  Administered 2019-01-12 – 2019-01-15 (×4): 15 mg via ORAL
  Filled 2019-01-12 (×4): qty 1
  Filled 2019-01-12: qty 7
  Filled 2019-01-12: qty 1

## 2019-01-12 MED ORDER — VITAMIN B-1 100 MG PO TABS
100.0000 mg | ORAL_TABLET | Freq: Every day | ORAL | Status: DC
  Administered 2019-01-12 – 2019-01-16 (×5): 100 mg via ORAL
  Filled 2019-01-12 (×7): qty 1

## 2019-01-12 MED ORDER — AMLODIPINE BESYLATE 10 MG PO TABS
10.0000 mg | ORAL_TABLET | Freq: Every day | ORAL | Status: DC
  Administered 2019-01-12 – 2019-01-16 (×5): 10 mg via ORAL
  Filled 2019-01-12 (×2): qty 1
  Filled 2019-01-12: qty 7
  Filled 2019-01-12 (×4): qty 1

## 2019-01-12 MED ORDER — FOLIC ACID 1 MG PO TABS
1.0000 mg | ORAL_TABLET | Freq: Every day | ORAL | Status: DC
  Administered 2019-01-12 – 2019-01-16 (×5): 1 mg via ORAL
  Filled 2019-01-12 (×7): qty 1

## 2019-01-12 MED ORDER — METFORMIN HCL ER 500 MG PO TB24
500.0000 mg | ORAL_TABLET | Freq: Every day | ORAL | Status: DC
  Administered 2019-01-13 – 2019-01-16 (×4): 500 mg via ORAL
  Filled 2019-01-12 (×2): qty 1
  Filled 2019-01-12: qty 7
  Filled 2019-01-12 (×4): qty 1

## 2019-01-12 MED ORDER — CHLORDIAZEPOXIDE HCL 25 MG PO CAPS: 25.0000 mg | ORAL_CAPSULE | Freq: Four times a day (QID) | ORAL | Status: DC | PRN

## 2019-01-12 MED ORDER — ARIPIPRAZOLE 2 MG PO TABS
2.0000 mg | ORAL_TABLET | Freq: Every day | ORAL | Status: DC
  Administered 2019-01-12 – 2019-01-15 (×4): 2 mg via ORAL
  Filled 2019-01-12: qty 1
  Filled 2019-01-12: qty 7
  Filled 2019-01-12 (×4): qty 1

## 2019-01-12 MED ORDER — LISINOPRIL 2.5 MG PO TABS
2.5000 mg | ORAL_TABLET | Freq: Every day | ORAL | Status: DC
  Administered 2019-01-12 – 2019-01-14 (×3): 2.5 mg via ORAL
  Filled 2019-01-12 (×5): qty 1

## 2019-01-12 MED ORDER — TRAZODONE HCL 50 MG PO TABS
50.0000 mg | ORAL_TABLET | Freq: Every evening | ORAL | Status: DC | PRN
  Administered 2019-01-13 – 2019-01-15 (×3): 50 mg via ORAL
  Filled 2019-01-12 (×2): qty 1
  Filled 2019-01-12: qty 7
  Filled 2019-01-12 (×3): qty 1

## 2019-01-12 MED ORDER — ACETAMINOPHEN 325 MG PO TABS
650.0000 mg | ORAL_TABLET | Freq: Four times a day (QID) | ORAL | Status: DC | PRN
  Administered 2019-01-12 – 2019-01-13 (×2): 650 mg via ORAL
  Filled 2019-01-12 (×2): qty 2

## 2019-01-12 MED ORDER — ASPIRIN 81 MG PO TBEC
81.0000 mg | DELAYED_RELEASE_TABLET | Freq: Every day | ORAL | Status: DC
  Administered 2019-01-12 – 2019-01-16 (×5): 81 mg via ORAL
  Filled 2019-01-12: qty 7
  Filled 2019-01-12 (×7): qty 1

## 2019-01-12 NOTE — Progress Notes (Signed)
Patient ID: [name], male   DOB: 01-17-1961, 58 y.o.   MRN: [id number] Patient admitted to the unit due to increased depression, anxiety, irritability and agitation.  Patient stated that he has not had a drink or smoked cigarettes in over 2 weeks.  Patient reports symptoms which include increased sleep, over eating and decreased concentration.  Patient states that he is taking his medications as prescribed, but is feeling no better. Patient also reports suicidal thoughts with no plan.    Safety search complete and patient found to be free of all injury and contraband.  Patient's belongings stored in locker #55.   Patient admitted to the unit without incident.
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
## 2019-01-13 ENCOUNTER — Encounter (HOSPITAL_COMMUNITY): Payer: Medicaid Other

## 2019-01-13 LAB — TSH: TSH: 1.21 u[IU]/mL (ref 0.350–4.500)

## 2019-01-13 LAB — CBC
HCT: 45.5 % (ref 39.0–52.0)
Hemoglobin: 14.1 g/dL (ref 13.0–17.0)
MCH: 27.5 pg (ref 26.0–34.0)
MCHC: 31 g/dL (ref 30.0–36.0)
MCV: 88.9 fL (ref 80.0–100.0)
Platelets: 235 10*3/uL (ref 150–400)
RBC: 5.12 MIL/uL (ref 4.22–5.81)
RDW: 12.5 % (ref 11.5–15.5)
WBC: 5.3 10*3/uL (ref 4.0–10.5)
nRBC: 0 % (ref 0.0–0.2)

## 2019-01-13 LAB — COMPREHENSIVE METABOLIC PANEL
ALT: 44 U/L (ref 0–44)
AST: 22 U/L (ref 15–41)
Albumin: 4.2 g/dL (ref 3.5–5.0)
Alkaline Phosphatase: 57 U/L (ref 38–126)
Anion gap: 8 (ref 5–15)
BUN: 12 mg/dL (ref 6–20)
CO2: 23 mmol/L (ref 22–32)
Calcium: 9.2 mg/dL (ref 8.9–10.3)
Chloride: 111 mmol/L (ref 98–111)
Creatinine, Ser: 0.75 mg/dL (ref 0.61–1.24)
GFR calc Af Amer: >60 mL/min (ref >60)
GFR calc non Af Amer: >60 mL/min (ref >60)
Glucose, Bld: 141 mg/dL — ABNORMAL HIGH (ref 70–99)
Potassium: 3.9 mmol/L (ref 3.5–5.1)
Sodium: 142 mmol/L (ref 135–145)
Total Bilirubin: 0.3 mg/dL (ref 0.3–1.2)
Total Protein: 6.7 g/dL (ref 6.5–8.1)

## 2019-01-13 LAB — GLUCOSE, CAPILLARY
Glucose-Capillary: 197 mg/dL — ABNORMAL HIGH (ref 70–99)
Glucose-Capillary: 109 mg/dL — ABNORMAL HIGH (ref 70–99)

## 2019-01-13 LAB — ETHANOL: Alcohol, Ethyl (B): <10 mg/dL (ref <10)
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
## 2019-01-13 NOTE — Progress Notes (Signed)
D: Pt was in dayroom upon initial approach.  Pt presents with depressed affect and mood.  He reports feeling "a little bit better than yesterday."  He states he is "looking to go into inpatient alcohol treatment."  Pt was encouraged to discuss further with social worker and he verbalized understanding.  Pt denies SI/HI, denies hallucinations, reports back pain of 8/10.  Pt has been visible in milieu interacting with peers and staff appropriately.    A: Met with pt 1:1.  Actively listened to pt and offered support and encouragement. Medications administered per order.  PRN medication administered for pain and sleep.  Cold packs provided for pain.  15 minute safety checks in place.  R: Pt is safe on the unit.  Pt is compliant with medications.  Pt verbally contracts for safety.  Will continue to monitor and assess.

## 2019-01-13 NOTE — Progress Notes (Signed)
Recreation Therapy Notes  Date:  7.17.20 Time: 0930 Location: [address] Dayroom  Group Topic: Stress Management  Goal Area(s) Addresses:  Patient will identify positive stress management techniques. Patient will identify benefits of using stress management post d/c.  Intervention: Stress Management  Activity : Guided Imagery.  LRT introduced the stress management technique of guided imagery.  LRT read a script that focused on imagining your peaceful place.  Patients were to listen and follow along as script was read to participate in activity.  Education:  Stress Management, Discharge Planning.   Education Outcome: Acknowledges Education  Clinical Observations/Feedback: Pt did not attend group.     [name], LRT/CTRS         Ria Comment, Mindel Friscia A 01/13/2019 10:40 AM

## 2019-01-13 NOTE — BHH Suicide Risk Assessment (Signed)
[facility] INPATIENT:  Family/Significant Other Suicide Prevention Education  Suicide Prevention Education:  Patient Refusal for Family/Significant Other Suicide Prevention Education: The patient [name] has refused to provide written consent for family/significant other to be provided Family/Significant Other Suicide Prevention Education during admission and/or prior to discharge.  Physician notified.  Joanne Chars, LCSW 01/13/2019, 1:28 PM

## 2019-01-13 NOTE — Plan of Care (Signed)
  Problem: Education: Goal: Mental status will improve Outcome: Progressing   Problem: Coping: Goal: Ability to demonstrate self-control will improve Outcome: Progressing

## 2019-01-13 NOTE — Progress Notes (Signed)
Pt rates depression 8/10, anxiety 4/10, and hopelessness 7/10.  Pt denies SI/HI. Pt reported poor sleep last night. Pt c/o neuropathy and requested Gabapentin. MD made aware in tx team. Pt compliant with taking medications and no side effects to meds verbalized by pt.  Orders reviewed with pt. Verbal support provided. 15 minute checks performed for safety.  Pt compliant with tx plan.

## 2019-01-13 NOTE — BHH Suicide Risk Assessment (Signed)
[facility] Admission Suicide Risk Assessment   Nursing information obtained from:  Patient Demographic factors:  Male Current Mental Status:  Suicidal ideation indicated by patient Loss Factors:  NA Historical Factors:  NA Risk Reduction Factors:  Sense of responsibility to family, Positive social support  Total Time spent with patient: 45 minutes Principal Problem: <principal problem not specified> Diagnosis:  Active Problems:   MDD (major depressive disorder)  Subjective Data: MDD  Continued Clinical Symptoms:  Alcohol Use Disorder Identification Test Final Score (AUDIT): 0 The "Alcohol Use Disorders Identification Test", Guidelines for Use in Primary Care, Second Edition.  World Pharmacologist [facility]). Score between 0-7:  no or low risk or alcohol related problems. Score between 8-15:  moderate risk of alcohol related problems. Score between 16-19:  high risk of alcohol related problems. Score 20 or above:  warrants further diagnostic evaluation for alcohol dependence and treatment.   CLINICAL FACTORS:   Dysthymia   Musculoskeletal: Strength & Muscle Tone: within normal limits Gait & Station: normal Patient leans: N/A  Psychiatric Specialty Exam: Physical Exam  Nursing note and vitals reviewed. Constitutional: He appears well-developed and well-nourished.    Review of Systems  Constitutional: Negative.   Gastrointestinal: Negative.   Neurological: Negative.   Endo/Heme/Allergies: Negative.     Blood pressure (!) 137/92, pulse 77, temperature 98.3 F (36.8 C), temperature source Oral, resp. rate 18, SpO2 100 %.There is no height or weight on file to calculate BMI.  General Appearance: Disheveled  Eye Contact:  Good  Speech:  Clear and Coherent and Slow  Volume:  Decreased  Mood:  Depressed  Affect:  Blunt  Thought Process:  Coherent and Linear  Orientation:  Full (Time, Place, and Person)  Thought Content:  Logical and Tangential  Suicidal Thoughts:  Yes.   without intent/plan  Homicidal Thoughts:  No  Memory:  Immediate;   Good  Judgement:  Good  Insight:  Good  Psychomotor Activity:  Normal  Concentration:  Concentration: Good  Recall:  Good  Fund of Knowledge:  Good  Language:  Good  Akathisia:  Negative  Handed:  Right  AIMS (if indicated):     Assets:  Physical Health Resilience  ADL's:  Intact  Cognition:  WNL  Sleep:  Number of Hours: 6.75      COGNITIVE FEATURES THAT CONTRIBUTE TO RISK:  None    SUICIDE RISK:   Moderate:  Frequent suicidal ideation with limited intensity, and duration, some specificity in terms of plans, no associated intent, good self-control, limited dysphoria/symptomatology, some risk factors present, and identifiable protective factors, including available and accessible social support.  PLAN OF CARE: see eval/ can contract  I certify that inpatient services furnished can reasonably be expected to improve the patient's condition.   Johnn Hai, MD 01/13/2019, 11:58 AM

## 2019-01-13 NOTE — Care Management (Signed)
CMA sent referral to [facility] will continue to follow up and notify CSW.       Nathen Balaban Care Management Assistant  Email:[email or web address] Office: [phone]

## 2019-01-13 NOTE — Tx Team (Signed)
Interdisciplinary Treatment and Diagnostic Plan Update  01/13/2019 Time of Session:  [name]  MRN: [id number]  Principal Diagnosis: <principal problem not specified>  Secondary Diagnoses: Active Problems:   MDD (major depressive disorder)   Current Medications:  Current Facility-Administered Medications  Medication Dose Route Frequency Provider Last Rate Last Dose  . acetaminophen (TYLENOL) tablet 650 mg  650 mg Oral Q6H PRN Mordecai Maes, NP   650 mg at 01/12/19 2113  . alum & mag hydroxide-simeth (MAALOX/MYLANTA) 200-200-20 MG/5ML suspension 30 mL  30 mL Oral Q4H PRN Mordecai Maes, NP      . amLODipine (NORVASC) tablet 10 mg  10 mg Oral Daily Sharma Covert, MD   10 mg at 01/13/19 0810  . ARIPiprazole (ABILIFY) tablet 2 mg  2 mg Oral QHS Sharma Covert, MD   2 mg at 01/12/19 2114  . aspirin EC tablet 81 mg  81 mg Oral Daily Sharma Covert, MD   81 mg at 01/13/19 0810  . chlordiazePOXIDE (LIBRIUM) capsule 25 mg  25 mg Oral QID PRN Sharma Covert, MD      . folic acid (FOLVITE) tablet 1 mg  1 mg Oral Daily Sharma Covert, MD   1 mg at 01/13/19 0810  . lisinopril (ZESTRIL) tablet 2.5 mg  2.5 mg Oral Daily Sharma Covert, MD   2.5 mg at 01/13/19 0810  . metFORMIN (GLUCOPHAGE-XR) 24 hr tablet 500 mg  500 mg Oral Q breakfast Sharma Covert, MD   500 mg at 01/13/19 0810  . mirtazapine (REMERON) tablet 15 mg  15 mg Oral QHS Sharma Covert, MD   15 mg at 01/12/19 2113  . thiamine (VITAMIN B-1) tablet 100 mg  100 mg Oral Daily Sharma Covert, MD   100 mg at 01/13/19 0810  . traZODone (DESYREL) tablet 50 mg  50 mg Oral QHS PRN Sharma Covert, MD       PTA Medications: Medications Prior to Admission  Medication Sig Dispense Refill Last Dose  . ACCU-CHEK AVIVA PLUS test strip Use up to 3 times daily  11   . ACCU-CHEK FASTCLIX LANCETS MISC Use up to 3 times daily  11   . aspirin 81 MG chewable tablet Chew 1 tablet (81 mg total) by mouth daily.  30 tablet 3   . Blood Glucose Monitoring Suppl (ACCU-CHEK AVIVA PLUS) w/Device KIT Use up to 3 times daily  0   . co-enzyme Q-10 30 MG capsule Take 30 mg by mouth daily.     Marland Kitchen ibuprofen (ADVIL) 800 MG tablet Take 800 mg by mouth every 8 (eight) hours as needed for pain.     . isosorbide mononitrate (IMDUR) 30 MG 24 hr tablet Take 1 tablet (30 mg total) by mouth daily. 30 tablet 0   . LIVALO 4 MG TABS Take 1 tablet by mouth every morning.      . metFORMIN (GLUCOPHAGE-XR) 500 MG 24 hr tablet Take 1 tablet (500 mg total) by mouth daily with breakfast. For diabetes management 90 tablet 1   . metoprolol tartrate (LOPRESSOR) 50 MG tablet Take 1 tablet (50 mg total) by mouth 2 (two) times daily. 180 tablet 3   . ticagrelor (BRILINTA) 90 MG TABS tablet Take 1 tablet (90 mg total) by mouth 2 (two) times daily. 180 tablet 2   . ibuprofen (ADVIL) 600 MG tablet Take 1 tablet (600 mg total) by mouth 2 (two) times a day. Take for 2 weeks (Patient not  taking: Reported on 01/12/2019) 28 tablet 0 Not Taking at Unknown time  . nitroGLYCERIN (NITROSTAT) 0.4 MG SL tablet Place 1 tablet (0.4 mg total) under the tongue every 5 (five) minutes x 3 doses as needed for chest pain. (Patient not taking: Reported on 01/04/2019) 25 tablet 2 Not Taking at Unknown time    Patient Stressors:    Patient Strengths:    Treatment Modalities: Medication Management, Group therapy, Case management,  1 to 1 session with clinician, Psychoeducation, Recreational therapy.   Physician Treatment Plan for Primary Diagnosis: <principal problem not specified> Long Term Goal(s):     Short Term Goals:    Medication Management: Evaluate patient's response, side effects, and tolerance of medication regimen.  Therapeutic Interventions: 1 to 1 sessions, Unit Group sessions and Medication administration.  Evaluation of Outcomes: Not Met  Physician Treatment Plan for Secondary Diagnosis: Active Problems:   MDD (major depressive  disorder)  Long Term Goal(s):     Short Term Goals:       Medication Management: Evaluate patient's response, side effects, and tolerance of medication regimen.  Therapeutic Interventions: 1 to 1 sessions, Unit Group sessions and Medication administration.  Evaluation of Outcomes: Not Met   RN Treatment Plan for Primary Diagnosis: <principal problem not specified> Long Term Goal(s): Knowledge of disease and therapeutic regimen to maintain health will improve  Short Term Goals: Ability to demonstrate self-control, Ability to participate in decision making will improve, Ability to verbalize feelings will improve, Ability to disclose and discuss suicidal ideas, Ability to identify and develop effective coping behaviors will improve and Compliance with prescribed medications will improve  Medication Management: RN will administer medications as ordered by provider, will assess and evaluate patient's response and provide education to patient for prescribed medication. RN will report any adverse and/or side effects to prescribing provider.  Therapeutic Interventions: 1 on 1 counseling sessions, Psychoeducation, Medication administration, Evaluate responses to treatment, Monitor vital signs and CBGs as ordered, Perform/monitor CIWA, COWS, AIMS and Fall Risk screenings as ordered, Perform wound care treatments as ordered.  Evaluation of Outcomes: Not Met   LCSW Treatment Plan for Primary Diagnosis: <principal problem not specified> Long Term Goal(s): Safe transition to appropriate next level of care at discharge, Engage patient in therapeutic group addressing interpersonal concerns.  Short Term Goals: Engage patient in aftercare planning with referrals and resources  Therapeutic Interventions: Assess for all discharge needs, 1 to 1 time with Social worker, Explore available resources and support systems, Assess for adequacy in community support network, Educate family and significant other(s)  on suicide prevention, Complete Psychosocial Assessment, Interpersonal group therapy.  Evaluation of Outcomes: Not Met   Progress in Treatment: Attending groups: No.  New to unit Participating in groups: No. Taking medication as prescribed: Yes. Toleration medication: Yes. Family/Significant other contact made: No, will contact:  if patient consents to collateral contacts Patient understands diagnosis: Yes. Discussing patient identified problems/goals with staff: Yes. Medical problems stabilized or resolved: Yes. Denies suicidal/homicidal ideation: Yes. Issues/concerns per patient self-inventory: No. Other:   New problem(s) identified: None   New Short Term/Long Term Goal(s):Detox, medication stabilization, elimination of SI thoughts, development of comprehensive mental wellness plan.    Patient Goals:    Discharge Plan or Barriers: Patient recently admitted. CSW will continue to follow and assess for appropriate referrals and possible discharge planning.    Reason for Continuation of Hospitalization: Anxiety Depression Medication stabilization Withdrawal symptoms  Estimated Length of Stay: 3-5 days   Attendees: Patient: 01/13/2019 11:05 AM  Physician: Dr. Johnn Hai, MD 01/13/2019 11:05 AM  Nursing: Sharl Ma.Viona Gilmore, RN 01/13/2019 11:05 AM  RN Care Manager: 01/13/2019 11:05 AM  Social Worker: Radonna Ricker, [place] 01/13/2019 11:05 AM  Recreational Therapist:  01/13/2019 11:05 AM  Other:  01/13/2019 11:05 AM  Other:  01/13/2019 11:05 AM  Other: 01/13/2019 11:05 AM    Scribe for Treatment Team: [name], [place] 01/13/2019 11:05 AM

## 2019-01-13 NOTE — H&P (Addendum)
Psychiatric Admission Assessment Adult  Patient Identification: [name] MRN:  [id number] Date of Evaluation:  01/13/2019 Chief Complaint:  MDD ALCOHOL USE DISORDER Principal Diagnosis: MDD Diagnosis:  Active Problems:   MDD (major depressive disorder)  History of Present Illness:   This is a repeat admission for [name] well-known to the service last here in February, once again he presents with depression, suicidal thoughts, anhedonia, apathy, and as discussed below he felt it best to receive treatment.  He lives at home alone he states he has been drinking but does not believe he needs a full detox regimen.  He has been generally sober with some relapses intermittently but he is vague about the amounts.  Has a history of hypertension type 2 diabetes history of MI he does not believe he has post MI depression in the past just recurrent and severe depression independent of that. He does not have auditory or visual hallucinations. Despite recent suicidal thoughts can contract for safety here  According to our assessment team note of yesterday  [name] is an 58 y.o. male who presents voluntarily to [facility] for a walk-in assessment. Pt is reporting symptoms of depression and while he doesn't want to state he is having suicidal thoughts, he admits "it wouldn't be a bad idea". Pt has a history of Major Depressive Disorder dx with 2 past suicide attempts.  Pt states he was encouraged by his sister, who is a Social worker, to come for assessment. When pt stated he couldn't leave his apartment in such an untidy state, sister came over to help clean. Pt states his sister was surprised at the state of pt's home given he is usually neat.   Pt reports intermittent medication compliance. He states he has all his medications, just isn't motivated to take them lately. Pt reports past suicide attempts included an overdose on meds & intentionally driving his car off a bridge. Pt reports he owns a  gun. Pt has medical dx of Type 2 Diabetes, chronic pain & heart condition. Pt states he knows he is self-harming when he isn't careful with his diet. He states he has gained 20 lbs from eating sugary & fatty foods over the past few months.   Pt acknowledges multiple symptoms of Depression. He reports vague homicidal ideation towards a friend from [place] who crossed him. Pt denies homicidal plan. Pt denies AVH & other psychotic symptoms. Pt states current stressors include loss of his routines with Covid quarantine. When pt stopped drinking alcohol, he became active in working out at the gym. He also regularly attended [place] meetings. Those habits have been central to pt staying well. AA meeting have continued online, & pt attempted to remain active but didn't find it helpful.    Pt lives alone, and supports include his sister, [name] [phone]. Pt gave verbal permission to speak with sister. Pt denies hx of abuse and trauma. Pt reports there is a family history of Depression and suicide. 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 Pt has good insight and fair judgment. Pt's memory is intact. Pt has not current legal charges or probation. ? Pt's OP history includes [facility] but he hasn't been in months. IP history includes [facility]. Last admission was at [facility] 07/2018.  Pt reports recent 2-day binges of alcohol (about a gallon of Gin) about ever 3 weeks. ? MSE: Pt is casually dressed, alert, oriented x4 with normal speech and normal motor behavior. Eye contact is good.  Pt's mood is depressed and affect is depressed and constricted. Affect is congruent with mood. Thought process is coherent and relevant. There is no indication Pt is currently responding to internal stimuli or experiencing delusional thought content. Pt was cooperative throughout assessment.    Associated Signs/Symptoms: Depression Symptoms:  anhedonia, (Hypo) Manic Symptoms:  Distractibility, Anxiety Symptoms:   Excessive Worry, Psychotic Symptoms:  n/a PTSD Symptoms: NA Total Time spent with patient: 45 minutes  Past Psychiatric History: last admit 07/2018  Is the patient at risk to self? Yes.    Has the patient been a risk to self in the past 6 months? Yes.    Has the patient been a risk to self within the distant past? Yes.    Is the patient a risk to others? No.  Has the patient been a risk to others in the past 6 months? No.  Has the patient been a risk to others within the distant past? No.   Prior Inpatient Therapy: Prior Inpatient Therapy: Yes Prior Therapy Dates: 07/2018 Prior Therapy Facilty/Provider(s): [facility] Reason for Treatment: Depression, SI Prior Outpatient Therapy: Prior Outpatient Therapy: Yes Prior Therapy Dates: ongoing Prior Therapy Facilty/Provider(s): [facility] Reason for Treatment: Depression Does patient have an ACCT team?: No Does patient have Intensive In-House Services?  : No Does patient have [facility] services? : Yes Does patient have P4[facility] services?: No  Alcohol Screening: 1. How often do you have a drink containing alcohol?: Never 2. How many drinks containing alcohol do you have on a typical day when you are drinking?: 1 or 2 3. How often do you have six or more drinks on one occasion?: Never AUDIT-C Score: 0 4. How often during the last year have you found that you were not able to stop drinking once you had started?: Never 5. How often during the last year have you failed to do what was normally expected from you becasue of drinking?: Never 6. How often during the last year have you needed a first drink in the morning to get yourself going after a heavy drinking session?: Never 7. How often during the last year have you had a feeling of guilt of remorse after drinking?: Never 8. How often during the last year have you been unable to remember what happened the night before because you had been drinking?: Never 9. Have you or someone else been injured as a result  of your drinking?: No 10. Has a relative or friend or a doctor or another health worker been concerned about your drinking or suggested you cut down?: No Alcohol Use Disorder Identification Test Final Score (AUDIT): 0 Substance Abuse History in the last 12 months:  Yes.   Consequences of Substance Abuse: NA Previous Psychotropic Medications: Yes  Psychological Evaluations: No  Past Medical History:  Past Medical History:  Diagnosis Date  . Allergies   . Arthritis   . Chronic lower back pain   . Coronary artery disease    a. Multiple prior caths/PCI. Cath 2013 with possible spasm of RCA, 70% ISR of mid LCx with subsequent DES to mLCx and prox LCX. b. H/o microvascular angina. c. Recurrent angina 08/2014 - s/p PTCA/DES to prox Cx, PTCA/CBA to OM1.  c. LHC 06/10/15 with patent stents and some ISR in LCX and OM-1 that was not flow limiting --> Rx   . Dyslipidemia    a. Intolerant to many statins except tolerating Livalo.  Marland Kitchen GERD (gastroesophageal reflux disease)   . Hypertension   . Myocardial infarction (  [place]) ~ 2010  . S/P angioplasty with stent, DES, to proximal and mid LCX 12/15/11 12/15/2011  . Shoulder pain   . Stroke [facility])    pt. reports had a stroke around time of MI 2010  . Type II diabetes mellitus ([place])     Past Surgical History:  Procedure Laterality Date  . CARDIAC CATHETERIZATION  06/15/2002   LAD with prox 40% stenosis, norma L main, Cfx with 25% lesion, RCA with long mid 25% stenosis (Dr. Vita Barley)  . CARDIAC CATHETERIZATION  04/01/2010   normal L main, LAD wit mild stenosis, L Cfx with 70% in-stent restenosis, RCA with 70% in-stent restenosis, LVEF >60% (Dr. K. Mali Hilty) - cutting ballon arthrectomy to RCA & Cfx (Dr. Rockne Menghini)  . CARDIAC CATHETERIZATION  08/25/2010   preserved global LV contractility; multivessel CAD, diffuse 90-95% in-stent restenosis in prox placed Cfx stent - cutting balloon arthrectomy in Cfx with multiple dilatations 90-95% to 0% stenosis (Dr. Corky Downs)  . CARDIAC CATHETERIZATION  01/26/2011   PCI & stenting of aggresive in-stent restenosis within previously stented AV groove Cfx with 3.0x50m Taxus DES (previous stents were Promus) (Dr. JAdora Fridge  . CARDIAC CATHETERIZATION  05/11/2011   preserved LV function, 40% mid LAD stenosis, 30-40% narrowing proximal to stented semgnet of prox Cfx, patent mid RCA stent with smooth 20% narrowing in distal RCA (Dr. TCorky Downs  . CARDIAC CATHETERIZATION  12/15/2011   PCI & stenting of proximal & mid Cfx with DES - 3.0x186min proximal, 3.0x1541mn mid (Dr. J. Adora Fridge. CARDIAC CATHETERIZATION N/A 06/10/2015   Procedure: Left Heart Cath and Coronary Angiography;  Surgeon: DanJolaine ArtistD;  Location: [facility];  Service: Cardiovascular;  Laterality: N/A;  . CARDIAC CATHETERIZATION  10/25/2018  . cardiometabolic testing  08/09/14/5790good exercise effort, peak VO2 79% predicted with normal VO2 HR curves (mild deconditioning)  . COLONOSCOPY  12/2012   diminutive hyperplastic sigmoid poyp so repeat routine 2024  . CORONARY BALLOON ANGIOPLASTY N/A 10/25/2018   Procedure: CORONARY BALLOON ANGIOPLASTY;  Surgeon: VarJettie BoozeD;  Location: [facility];  Service: Cardiovascular;  Laterality: N/A;  . EXCISIONAL HEMORRHOIDECTOMY  1984  . LEFT HEART CATH AND CORONARY ANGIOGRAPHY N/A 10/25/2018   Procedure: LEFT HEART CATH AND CORONARY ANGIOGRAPHY;  Surgeon: VarJettie BoozeD;  Location: [facility];  Service: Cardiovascular;  Laterality: N/A;  . LEFT HEART CATHETERIZATION WITH CORONARY ANGIOGRAM N/A 05/11/2011   Procedure: LEFT HEART CATHETERIZATION WITH CORONARY ANGIOGRAM;  Surgeon: ThoTroy SineD;  [facility];  Service: Cardiovascular;  Laterality: N/A;  Possible percutaneous coronary intervention, possible IVUS  . LEFT HEART CATHETERIZATION WITH CORONARY ANGIOGRAM N/A 12/15/2011   Procedure: LEFT HEART CATHETERIZATION WITH CORONARY ANGIOGRAM;  Surgeon:  JonLorretta HarpD;  [facility];  Service: Cardiovascular;  Laterality: N/A;  . LEFT HEART CATHETERIZATION WITH CORONARY ANGIOGRAM N/A 09/05/2014   Procedure: LEFT HEART CATHETERIZATION WITH CORONARY ANGIOGRAM;  Surgeon: ChrBurnell BlanksD;  [facility];  Service: Cardiovascular;  Laterality: N/A;  . LIPOMA EXCISION     back of the head  . NM MYOCAR PERF WALL MOTION  02/2012   lexiscan myoview; mild perfusion defect in mid inferolateral & basal inferolateral region (infarct/scar); EF 52%, abnormal but ow risk scan  . PERCUTANEOUS CORONARY STENT INTERVENTION (PCI-S)  09/05/2014   Procedure: PERCUTANEOUS CORONARY STENT INTERVENTION (PCI-S);  Surgeon: ChrBurnell BlanksD;  Location: [facility] CATH LAB;  Service: Cardiovascular;;   Family History:  Family History  Problem Relation Age of Onset  . Leukemia Mother   . Prostate cancer Father   . Coronary artery disease Paternal Grandmother   . Cancer Paternal Grandfather   . Cancer Brother    Family Psychiatric  History: Unaware of that Tobacco Screening:   Social History:  Social History   Substance and Sexual Activity  Alcohol Use Yes     Social History   Substance and Sexual Activity  Drug Use No   Comment: 12/15/11 "last cocaine was 2010"    Additional Social History: Marital status: Widowed    Pain Medications: See MAR Prescriptions: See MAR- has been taking inconsistently at home Over the Counter: See MAR History of alcohol / drug use?: Yes Name of Substance 1: alcohol, gin 1 - Amount (size/oz): gallon 1 - Frequency: 2 day binges q 3 weeks 1 - Last Use / Amount: 2 weeks ago                  Allergies:   Allergies  Allergen Reactions  . Bee Venom Anaphylaxis and Hives  . Shellfish Allergy Anaphylaxis and Hives  . Statins Other (See Comments)    Myalgias. Tolerating livalo.   . Testosterone Cypionate     Testerone Injection --Increased breast tissue    Lab Results:  Results for  orders placed or performed during the hospital encounter of 01/12/19 (from the past 48 hour(s))  SARS Coronavirus 2 (CEPHEID - Performed in [facility] hospital lab), Hosp Order     Status: None   Collection Time: 01/12/19 12:57 PM   Specimen: Nasopharyngeal Swab  Result Value Ref Range   SARS Coronavirus 2 NEGATIVE NEGATIVE    Comment: (NOTE) If result is NEGATIVE SARS-CoV-2 target nucleic acids are NOT DETECTED. The SARS-CoV-2 RNA is generally detectable in upper and lower  respiratory specimens during the acute phase of infection. The lowest  concentration of SARS-CoV-2 viral copies this assay can detect is 250  copies / mL. A negative result does not preclude SARS-CoV-2 infection  and should not be used as the sole basis for treatment or other  patient management decisions.  A negative result may occur with  improper specimen collection / handling, submission of specimen other  than nasopharyngeal swab, presence of viral mutation(s) within the  areas targeted by this assay, and inadequate number of viral copies  (<250 copies / mL). A negative result must be combined with clinical  observations, patient history, and epidemiological information. If result is POSITIVE SARS-CoV-2 target nucleic acids are DETECTED. The SARS-CoV-2 RNA is generally detectable in upper and lower  respiratory specimens dur ing the acute phase of infection.  Positive  results are indicative of active infection with SARS-CoV-2.  Clinical  correlation with patient history and other diagnostic information is  necessary to determine patient infection status.  Positive results do  not rule out bacterial infection or co-infection with other viruses. If result is PRESUMPTIVE POSTIVE SARS-CoV-2 nucleic acids MAY BE PRESENT.   A presumptive positive result was obtained on the submitted specimen  and confirmed on repeat testing.  While 2019 novel coronavirus  (SARS-CoV-2) nucleic acids may be present in the  submitted sample  additional confirmatory testing may be necessary for epidemiological  and / or clinical management purposes  to differentiate between  SARS-CoV-2 and other Sarbecovirus currently known to infect humans.  If clinically indicated additional testing with an alternate test  methodology [phone]) is advised. The SARS-CoV-2 RNA is generally  detectable in upper and lower respiratory sp ecimens during the acute  phase of infection. The expected result is Negative. Fact Sheet for Patients:  StrictlyIdeas.no Fact Sheet for Healthcare Providers: BankingDealers.co.za This test is not yet approved or cleared by the [place] FDA and has been authorized for detection and/or diagnosis of SARS-CoV-2 by FDA under an Emergency Use Authorization (EUA).  This EUA will remain in effect (meaning this test can be used) for the duration of the COVID-19 declaration under Section 564(b)(1) of the Act, 21 U.S.C. section 360bbb-3(b)(1), unless the authorization is terminated or revoked sooner. Performed at [facility], [place] [address] [place], [place] [address]   Glucose, capillary     Status: Abnormal   Collection Time: 01/13/19  5:37 AM  Result Value Ref Range   Glucose-Capillary 197 (H) 70 - 99 mg/dL  CBC     Status: None   Collection Time: 01/13/19  6:41 AM  Result Value Ref Range   WBC 5.3 4.0 - 10.5 K/uL   RBC 5.12 4.22 - 5.81 MIL/uL   Hemoglobin 14.1 13.0 - 17.0 g/dL   HCT 45.5 39.0 - 52.0 %   MCV 88.9 80.0 - 100.0 fL   MCH 27.5 26.0 - 34.0 pg   MCHC 31.0 30.0 - 36.0 g/dL   RDW 12.5 11.5 - 15.5 %   Platelets 235 150 - 400 K/uL   nRBC 0.0 0.0 - 0.2 %    Comment: Performed at [facility], [place] [address], [place], [place] [address]  Comprehensive metabolic panel     Status: Abnormal   Collection Time: 01/13/19  6:41 AM  Result Value Ref Range   Sodium 142 135 - 145 mmol/L   Potassium 3.9  3.5 - 5.1 mmol/L   Chloride 111 98 - 111 mmol/L   CO2 23 22 - 32 mmol/L   Glucose, Bld 141 (H) 70 - 99 mg/dL   BUN 12 6 - 20 mg/dL   Creatinine, Ser 0.75 0.61 - 1.24 mg/dL   Calcium 9.2 8.9 - 10.3 mg/dL   Total Protein 6.7 6.5 - 8.1 g/dL   Albumin 4.2 3.5 - 5.0 g/dL   AST 22 15 - 41 U/L   ALT 44 0 - 44 U/L   Alkaline Phosphatase 57 38 - 126 U/L   Total Bilirubin 0.3 0.3 - 1.2 mg/dL   GFR calc non Af Amer >60 >60 mL/min   GFR calc Af Amer >60 >60 mL/min   Anion gap 8 5 - 15    Comment: Performed at [facility], [place] [address] [place], [place] [address]  Ethanol     Status: None   Collection Time: 01/13/19  6:41 AM  Result Value Ref Range   Alcohol, Ethyl (B) <10 <10 mg/dL    Comment: (NOTE) Lowest detectable limit for serum alcohol is 10 mg/dL. For medical purposes only. Performed at [facility], [place] [address] [place], [place] [address]   TSH     Status: None   Collection Time: 01/13/19  6:41 AM  Result Value Ref Range   TSH 1.210 0.350 - 4.500 uIU/mL    Comment: Performed by a 3rd Generation assay with a functional sensitivity of <=0.01 uIU/mL. Performed at [facility], [place] [address] [place],  [address]     Blood Alcohol level:  Lab Results  Component Value Date   ETH <10 01/13/2019  ETH <10 34/19/3790    Metabolic Disorder Labs:  Lab Results  Component Value Date   HGBA1C 6.6 (H) 10/25/2018   MPG 142.72 10/25/2018   MPG 125.5 10/03/2017   No results found for: PROLACTIN Lab Results  Component Value Date   CHOL 143 10/26/2018   TRIG 125 10/26/2018   HDL 41 10/26/2018   CHOLHDL 3.5 10/26/2018   VLDL 25 10/26/2018   LDLCALC 77 10/26/2018   LDLCALC 62 10/03/2017    Current Medications: Current Facility-Administered Medications  Medication Dose Route Frequency Provider Last Rate Last Dose  . acetaminophen (TYLENOL) tablet 650 mg  650 mg Oral Q6H PRN Mordecai Maes, NP   650 mg at  01/12/19 2113  . alum & mag hydroxide-simeth (MAALOX/MYLANTA) 200-200-20 MG/5ML suspension 30 mL  30 mL Oral Q4H PRN Mordecai Maes, NP      . amLODipine (NORVASC) tablet 10 mg  10 mg Oral Daily Sharma Covert, MD   10 mg at 01/13/19 0810  . ARIPiprazole (ABILIFY) tablet 2 mg  2 mg Oral QHS Sharma Covert, MD   2 mg at 01/12/19 2114  . aspirin EC tablet 81 mg  81 mg Oral Daily Sharma Covert, MD   81 mg at 01/13/19 0810  . chlordiazePOXIDE (LIBRIUM) capsule 25 mg  25 mg Oral QID PRN Sharma Covert, MD      . folic acid (FOLVITE) tablet 1 mg  1 mg Oral Daily Sharma Covert, MD   1 mg at 01/13/19 0810  . lisinopril (ZESTRIL) tablet 2.5 mg  2.5 mg Oral Daily Sharma Covert, MD   2.5 mg at 01/13/19 0810  . metFORMIN (GLUCOPHAGE-XR) 24 hr tablet 500 mg  500 mg Oral Q breakfast Sharma Covert, MD   500 mg at 01/13/19 0810  . mirtazapine (REMERON) tablet 15 mg  15 mg Oral QHS Sharma Covert, MD   15 mg at 01/12/19 2113  . thiamine (VITAMIN B-1) tablet 100 mg  100 mg Oral Daily Sharma Covert, MD   100 mg at 01/13/19 0810  . traZODone (DESYREL) tablet 50 mg  50 mg Oral QHS PRN Sharma Covert, MD       PTA Medications: Medications Prior to Admission  Medication Sig Dispense Refill Last Dose  . ACCU-CHEK AVIVA PLUS test strip Use up to 3 times daily  11   . ACCU-CHEK FASTCLIX LANCETS MISC Use up to 3 times daily  11   . aspirin 81 MG chewable tablet Chew 1 tablet (81 mg total) by mouth daily. 30 tablet 3   . Blood Glucose Monitoring Suppl (ACCU-CHEK AVIVA PLUS) w/Device KIT Use up to 3 times daily  0   . co-enzyme Q-10 30 MG capsule Take 30 mg by mouth daily.     Marland Kitchen ibuprofen (ADVIL) 800 MG tablet Take 800 mg by mouth every 8 (eight) hours as needed for pain.     . isosorbide mononitrate (IMDUR) 30 MG 24 hr tablet Take 1 tablet (30 mg total) by mouth daily. 30 tablet 0   . LIVALO 4 MG TABS Take 1 tablet by mouth every morning.      . metFORMIN (GLUCOPHAGE-XR)  500 MG 24 hr tablet Take 1 tablet (500 mg total) by mouth daily with breakfast. For diabetes management 90 tablet 1   . metoprolol tartrate (LOPRESSOR) 50 MG tablet Take 1 tablet (50 mg total) by mouth 2 (two) times daily. 180 tablet 3   . ticagrelor (BRILINTA) 90  MG TABS tablet Take 1 tablet (90 mg total) by mouth 2 (two) times daily. 180 tablet 2   . ibuprofen (ADVIL) 600 MG tablet Take 1 tablet (600 mg total) by mouth 2 (two) times a day. Take for 2 weeks (Patient not taking: Reported on 01/12/2019) 28 tablet 0 Not Taking at Unknown time  . nitroGLYCERIN (NITROSTAT) 0.4 MG SL tablet Place 1 tablet (0.4 mg total) under the tongue every 5 (five) minutes x 3 doses as needed for chest pain. (Patient not taking: Reported on 01/04/2019) 25 tablet 2 Not Taking at Unknown time    Musculoskeletal: Strength & Muscle Tone: within normal limits Gait & Station: normal Patient leans: N/A  Psychiatric Specialty Exam: Physical Exam  Nursing note and vitals reviewed. Constitutional: He appears well-developed and well-nourished.    Review of Systems  Constitutional: Negative.   Gastrointestinal: Negative.   Neurological: Negative.   Endo/Heme/Allergies: Negative.     Blood pressure (!) 137/92, pulse 77, temperature 98.3 F (36.8 C), temperature source Oral, resp. rate 18, SpO2 100 %.There is no height or weight on file to calculate BMI.  General Appearance: Disheveled  Eye Contact:  Good  Speech:  Clear and Coherent and Slow  Volume:  Decreased  Mood:  Depressed  Affect:  Blunt  Thought Process:  Coherent and Linear  Orientation:  Full (Time, Place, and Person)  Thought Content:  Logical and Tangential  Suicidal Thoughts:  Yes.  without intent/plan  Homicidal Thoughts:  No  Memory:  Immediate;   Good  Judgement:  Good  Insight:  Good  Psychomotor Activity:  Normal  Concentration:  Concentration: Good  Recall:  Good  Fund of Knowledge:  Good  Language:  Good  Akathisia:  Negative  Handed:   Right  AIMS (if indicated):     Assets:  Physical Health Resilience  ADL's:  Intact  Cognition:  WNL  Sleep:  Number of Hours: 6.75    Treatment Plan Summary: Daily contact with patient to assess and evaluate symptoms and progress in treatment and Medication management  Observation Level/Precautions:  15 minute checks  Laboratory:  UDS  Psychotherapy: Cognitive and rehab based  Medications: Monitor for withdrawal  Consultations: Not necessary  Discharge Concerns: Longer-term improvement  Estimated LOS: 5-7  Other: 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/alcohol abuse relapse   Physician Treatment Plan for Primary Diagnosis: Resume antidepressant therapy Long Term Goal(s): Improvement in symptoms so as ready for discharge  Short Term Goals: Ability to identify changes in lifestyle to reduce recurrence of condition will improve, Ability to verbalize feelings will improve and Ability to demonstrate self-control will improve  Physician Treatment Plan for Secondary Diagnosis: Active Problems:   MDD (major depressive disorder)  Long Term Goal(s): Improvement in symptoms so as ready for discharge  Short Term Goals: Ability to demonstrate self-control will improve, Ability to identify and develop effective coping behaviors will improve and Ability to maintain clinical measurements within normal limits will improve  I certify that inpatient services furnished can reasonably be expected to improve the patient's condition.    Johnn Hai, MD 7/17/202011:45 AM

## 2019-01-13 NOTE — Progress Notes (Cosign Needed)
[facility] NOVEL CORONAVIRUS (COVID-19) DAILY CHECK-OFF SYMPTOMS - answer yes or no to each - every day NO YES  Have you had a fever in the past 24 hours?  . Fever (Temp > 37.80C / 100F) X   Have you had any of these symptoms in the past 24 hours? . New Cough .  Sore Throat  .  Shortness of Breath .  Difficulty Breathing .  Unexplained Body Aches   X   Have you had any one of these symptoms in the past 24 hours not related to allergies?   . Runny Nose .  Nasal Congestion .  Sneezing   X   If you have had runny nose, nasal congestion, sneezing in the past 24 hours, has it worsened?  X   EXPOSURES - check yes or no X   Have you traveled outside the state in the past 14 days?  X   Have you been in contact with someone with a confirmed diagnosis of COVID-19 or PUI in the past 14 days without wearing appropriate PPE?  X   Have you been living in the same home as a person with confirmed diagnosis of COVID-19 or a PUI (household contact)?    X   Have you been diagnosed with COVID-19?    X              What to do next: Answered NO to all: Answered YES to anything:   Proceed with unit schedule Follow the [facility] Inpatient Flowsheet.   

## 2019-01-13 NOTE — Progress Notes (Signed)
D: Pt was in his room upon initial approach.  Pt presents with depressed affect and mood.  He reports he is "doing pretty good" and his goal is "just trying to adjust to being here."  Pt denies SI/HI, denies hallucinations, reports chronic back pain of 4/10.  Pt has been visible in milieu interacting with peers and staff cautiously.   A: Introduced self to pt.  Actively listened to pt and offered support and encouragement.  Medications administered per order.  PRN medication administered for pain.  15 minute safety checks in place.  R: Pt is safe on the unit.  Pt is compliant with medications.  Pt verbally contracts for safety.  Will continue to monitor and assess.

## 2019-01-14 ENCOUNTER — Encounter (HOSPITAL_COMMUNITY): Payer: Self-pay | Admitting: Behavioral Health

## 2019-01-14 DIAGNOSIS — F329 Major depressive disorder, single episode, unspecified: Principal | ICD-10-CM

## 2019-01-14 LAB — GLUCOSE, CAPILLARY: Glucose-Capillary: 136 mg/dL — ABNORMAL HIGH (ref 70–99)

## 2019-01-14 MED ORDER — TICAGRELOR 90 MG PO TABS
90.0000 mg | ORAL_TABLET | Freq: Two times a day (BID) | ORAL | Status: DC
  Administered 2019-01-14 – 2019-01-16 (×4): 90 mg via ORAL
  Filled 2019-01-14 (×4): qty 1
  Filled 2019-01-14: qty 14
  Filled 2019-01-14: qty 1
  Filled 2019-01-14: qty 14
  Filled 2019-01-14: qty 1

## 2019-01-14 MED ORDER — METOPROLOL TARTRATE 50 MG PO TABS
50.0000 mg | ORAL_TABLET | Freq: Two times a day (BID) | ORAL | Status: DC
  Administered 2019-01-14 – 2019-01-16 (×4): 50 mg via ORAL
  Filled 2019-01-14 (×6): qty 1

## 2019-01-14 NOTE — Progress Notes (Signed)
[place] NOVEL CORONAVIRUS (COVID-19) DAILY CHECK-OFF SYMPTOMS - answer yes or no to each - every day NO YES  Have you had a fever in the past 24 hours?  . Fever (Temp > 37.80C / 100F) X   Have you had any of these symptoms in the past 24 hours? . New Cough .  Sore Throat  .  Shortness of Breath .  Difficulty Breathing .  Unexplained Body Aches   X   Have you had any one of these symptoms in the past 24 hours not related to allergies?   . Runny Nose .  Nasal Congestion .  Sneezing   X   If you have had runny nose, nasal congestion, sneezing in the past 24 hours, has it worsened?  X   EXPOSURES - check yes or no X   Have you traveled outside the state in the past 14 days?  X   Have you been in contact with someone with a confirmed diagnosis of COVID-19 or PUI in the past 14 days without wearing appropriate PPE?  X   Have you been living in the same home as a person with confirmed diagnosis of COVID-19 or a PUI (household contact)?    X   Have you been diagnosed with COVID-19?    X              What to do next: Answered NO to all: Answered YES to anything:   Proceed with unit schedule Follow the [facility] Inpatient Flowsheet.   

## 2019-01-14 NOTE — Progress Notes (Signed)
[facility] MD Progress Note  01/14/2019 2:02 PM [name]  MRN:  [id number]  Subjective:  " Ii feel like the fog is finally lifting."  Evaluation on the unit: Face to face evaluation competed and chart reviewed. In brief;  [name] is an 58 y.o. male who initially  Presented to the unit with depression, suicidal thoughts, anhedonia, apathy.   During this evaluation, he is alert and oriented x3, calm and cooperative. He endorses some improvement in depressive symptoms. He denies active or passive suicidal thoughts, homicidal ideas or psychosis. He does have a history of alcohol abuse although denies current withdrawal symptoms. He endorses he has been to rehab multiple times in the past as is a active member of AA although the there has been no group sessions since [place]. He reports he feels like this was a part of his relapse. He endorses no concerns with appetite or resting pattern. Reports his medications are without side effects. He reports he was on antiplatelet medication prior to his admission as well as Metoprolol and not lisinopril. He endorses this is his only concern at this time, getting back on these medications.   Principal Problem: <principal problem not specified> Diagnosis: Active Problems:   MDD (major depressive disorder)  Total Time spent with patient: 20 minutes  Past Psychiatric History: last admit to [facility] 07/2018. History of alcohol abuse,    Past Medical History:  Past Medical History:  Diagnosis Date  . Allergies   . Arthritis   . Chronic lower back pain   . Coronary artery disease    a. Multiple prior caths/PCI. Cath 2013 with possible spasm of RCA, 70% ISR of mid LCx with subsequent DES to mLCx and prox LCX. b. H/o microvascular angina. c. Recurrent angina 08/2014 - s/p PTCA/DES to prox Cx, PTCA/CBA to OM1.  c. LHC 06/10/15 with patent stents and some ISR in LCX and OM-1 that was not flow limiting --> Rx   . Dyslipidemia    a. Intolerant to many statins except  tolerating Livalo.  Marland Kitchen GERD (gastroesophageal reflux disease)   . Hypertension   . Myocardial infarction ([place]) ~ 2010  . S/P angioplasty with stent, DES, to proximal and mid LCX 12/15/11 12/15/2011  . Shoulder pain   . Stroke [facility])    pt. reports had a stroke around time of MI 2010  . Type II diabetes mellitus ([place])     Past Surgical History:  Procedure Laterality Date  . CARDIAC CATHETERIZATION  06/15/2002   LAD with prox 40% stenosis, norma L main, Cfx with 25% lesion, RCA with long mid 25% stenosis (Dr. Vita Barley)  . CARDIAC CATHETERIZATION  04/01/2010   normal L main, LAD wit mild stenosis, L Cfx with 70% in-stent restenosis, RCA with 70% in-stent restenosis, LVEF >60% (Dr. K. Mali Hilty) - cutting ballon arthrectomy to RCA & Cfx (Dr. Rockne Menghini)  . CARDIAC CATHETERIZATION  08/25/2010   preserved global LV contractility; multivessel CAD, diffuse 90-95% in-stent restenosis in prox placed Cfx stent - cutting balloon arthrectomy in Cfx with multiple dilatations 90-95% to 0% stenosis (Dr. Corky Downs)  . CARDIAC CATHETERIZATION  01/26/2011   PCI & stenting of aggresive in-stent restenosis within previously stented AV groove Cfx with 3.0x41mm Taxus DES (previous stents were Promus) (Dr. Adora Fridge)  . CARDIAC CATHETERIZATION  05/11/2011   preserved LV function, 40% mid LAD stenosis, 30-40% narrowing proximal to stented semgnet of prox Cfx, patent mid RCA stent with smooth 20% narrowing in distal  RCA (Dr. Corky Downs)  . CARDIAC CATHETERIZATION  12/15/2011   PCI & stenting of proximal & mid Cfx with DES - 3.0x96mm in proximal, 3.0x46mm in mid (Dr. Adora Fridge)  . CARDIAC CATHETERIZATION N/A 06/10/2015   Procedure: Left Heart Cath and Coronary Angiography;  Surgeon: Jolaine Artist, MD;  Location: [place];  Service: Cardiovascular;  Laterality: N/A;  . CARDIAC CATHETERIZATION  10/25/2018  . cardiometabolic testing  01/15/9469   good exercise effort, peak VO2 79% predicted with normal VO2 HR  curves (mild deconditioning)  . COLONOSCOPY  12/2012   diminutive hyperplastic sigmoid poyp so repeat routine 2024  . CORONARY BALLOON ANGIOPLASTY N/A 10/25/2018   Procedure: CORONARY BALLOON ANGIOPLASTY;  Surgeon: Jettie Booze, MD;  Location: [place] [facility];  Service: Cardiovascular;  Laterality: N/A;  . EXCISIONAL HEMORRHOIDECTOMY  1984  . LEFT HEART CATH AND CORONARY ANGIOGRAPHY N/A 10/25/2018   Procedure: LEFT HEART CATH AND CORONARY ANGIOGRAPHY;  Surgeon: Jettie Booze, MD;  Location: [place] [facility];  Service: Cardiovascular;  Laterality: N/A;  . LEFT HEART CATHETERIZATION WITH CORONARY ANGIOGRAM N/A 05/11/2011   Procedure: LEFT HEART CATHETERIZATION WITH CORONARY ANGIOGRAM;  Surgeon: Troy Sine, MD;  Location: [facility] CATH LAB;  Service: Cardiovascular;  Laterality: N/A;  Possible percutaneous coronary intervention, possible IVUS  . LEFT HEART CATHETERIZATION WITH CORONARY ANGIOGRAM N/A 12/15/2011   Procedure: LEFT HEART CATHETERIZATION WITH CORONARY ANGIOGRAM;  Surgeon: Lorretta Harp, MD;  Location: [facility] CATH LAB;  Service: Cardiovascular;  Laterality: N/A;  . LEFT HEART CATHETERIZATION WITH CORONARY ANGIOGRAM N/A 09/05/2014   Procedure: LEFT HEART CATHETERIZATION WITH CORONARY ANGIOGRAM;  Surgeon: Burnell Blanks, MD;  Location: [facility] CATH LAB;  Service: Cardiovascular;  Laterality: N/A;  . LIPOMA EXCISION     back of the head  . NM MYOCAR PERF WALL MOTION  02/2012   lexiscan myoview; mild perfusion defect in mid inferolateral & basal inferolateral region (infarct/scar); EF 52%, abnormal but ow risk scan  . PERCUTANEOUS CORONARY STENT INTERVENTION (PCI-S)  09/05/2014   Procedure: PERCUTANEOUS CORONARY STENT INTERVENTION (PCI-S);  Surgeon: Burnell Blanks, MD;  Location: [facility] CATH LAB;  Service: Cardiovascular;;   Family History:  Family History  Problem Relation Age of Onset  . Leukemia Mother   . Prostate cancer Father   . Coronary artery disease Paternal  Grandmother   . Cancer Paternal Grandfather   . Cancer Brother    Family Psychiatric  History: Unaware of family history of mental health illness  Social History:  Social History   Substance and Sexual Activity  Alcohol Use Yes     Social History   Substance and Sexual Activity  Drug Use No   Comment: 12/15/11 "last cocaine was 2010"    Social History   Socioeconomic History  . Marital status: Divorced    Spouse name: Not on file  . Number of children: 2  . Years of education: GED  . Highest education level: Not on file  Occupational History  . Not on file  Social Needs  . Financial resource strain: Not on file  . Food insecurity    Worry: Not on file    Inability: Not on file  . Transportation needs    Medical: Not on file    Non-medical: Not on file  Tobacco Use  . Smoking status: Former Smoker    Packs/day: 1.00    Years: 10.00    Pack years: 10.00    Types: Cigarettes  Quit date: 10/07/2018    Years since quitting: 0.2  . Smokeless tobacco: Never Used  Substance and Sexual Activity  . Alcohol use: Yes  . Drug use: No    Comment: 12/15/11 "last cocaine was 2010"  . Sexual activity: Not Currently  Lifestyle  . Physical activity    Days per week: Not on file    Minutes per session: Not on file  . Stress: Not on file  Relationships  . Social Herbalist on phone: Not on file    Gets together: Not on file    Attends religious service: Not on file    Active member of club or organization: Not on file    Attends meetings of clubs or organizations: Not on file    Relationship status: Not on file  Other Topics Concern  . Not on file  Social History Narrative  . Not on file   Additional Social History:    Pain Medications: See MAR Prescriptions: See MAR- has been taking inconsistently at home Over the Counter: See MAR History of alcohol / drug use?: Yes Name of Substance 1: alcohol, gin 1 - Amount (size/oz): gallon 1 - Frequency: 2 day  binges q 3 weeks 1 - Last Use / Amount: 2 weeks ago                  Sleep: Fair  Appetite:  Fair  Current Medications: Current Facility-Administered Medications  Medication Dose Route Frequency Provider Last Rate Last Dose  . acetaminophen (TYLENOL) tablet 650 mg  650 mg Oral Q6H PRN Mordecai Maes, NP   650 mg at 01/13/19 1926  . alum & mag hydroxide-simeth (MAALOX/MYLANTA) 200-200-20 MG/5ML suspension 30 mL  30 mL Oral Q4H PRN Mordecai Maes, NP      . amLODipine (NORVASC) tablet 10 mg  10 mg Oral Daily Sharma Covert, MD   10 mg at 01/14/19 6761  . ARIPiprazole (ABILIFY) tablet 2 mg  2 mg Oral QHS Sharma Covert, MD   2 mg at 01/13/19 2110  . aspirin EC tablet 81 mg  81 mg Oral Daily Sharma Covert, MD   81 mg at 01/14/19 0939  . chlordiazePOXIDE (LIBRIUM) capsule 25 mg  25 mg Oral QID PRN Sharma Covert, MD      . folic acid (FOLVITE) tablet 1 mg  1 mg Oral Daily Sharma Covert, MD   1 mg at 01/14/19 1003  . metFORMIN (GLUCOPHAGE-XR) 24 hr tablet 500 mg  500 mg Oral Q breakfast Sharma Covert, MD   500 mg at 01/14/19 9509  . metoprolol tartrate (LOPRESSOR) tablet 50 mg  50 mg Oral BID Sharma Covert, MD      . mirtazapine (REMERON) tablet 15 mg  15 mg Oral QHS Sharma Covert, MD   15 mg at 01/13/19 2110  . thiamine (VITAMIN B-1) tablet 100 mg  100 mg Oral Daily Sharma Covert, MD   100 mg at 01/14/19 3267  . ticagrelor (BRILINTA) tablet 90 mg  90 mg Oral BID Sharma Covert, MD      . traZODone (DESYREL) tablet 50 mg  50 mg Oral QHS PRN Sharma Covert, MD   50 mg at 01/13/19 2110    Lab Results:  Results for orders placed or performed during the hospital encounter of 01/12/19 (from the past 48 hour(s))  Glucose, capillary     Status: Abnormal   Collection Time: 01/13/19  5:37 AM  Result Value Ref Range   Glucose-Capillary 197 (H) 70 - 99 mg/dL  CBC     Status: None   Collection Time: 01/13/19  6:41 AM  Result Value Ref Range    WBC 5.3 4.0 - 10.5 K/uL   RBC 5.12 4.22 - 5.81 MIL/uL   Hemoglobin 14.1 13.0 - 17.0 g/dL   HCT 45.5 39.0 - 52.0 %   MCV 88.9 80.0 - 100.0 fL   MCH 27.5 26.0 - 34.0 pg   MCHC 31.0 30.0 - 36.0 g/dL   RDW 12.5 11.5 - 15.5 %   Platelets 235 150 - 400 K/uL   nRBC 0.0 0.0 - 0.2 %    Comment: Performed at [facility], [place] [address], [place], [place] [address]  Comprehensive metabolic panel     Status: Abnormal   Collection Time: 01/13/19  6:41 AM  Result Value Ref Range   Sodium 142 135 - 145 mmol/L   Potassium 3.9 3.5 - 5.1 mmol/L   Chloride 111 98 - 111 mmol/L   CO2 23 22 - 32 mmol/L   Glucose, Bld 141 (H) 70 - 99 mg/dL   BUN 12 6 - 20 mg/dL   Creatinine, Ser 0.75 0.61 - 1.24 mg/dL   Calcium 9.2 8.9 - 10.3 mg/dL   Total Protein 6.7 6.5 - 8.1 g/dL   Albumin 4.2 3.5 - 5.0 g/dL   AST 22 15 - 41 U/L   ALT 44 0 - 44 U/L   Alkaline Phosphatase 57 38 - 126 U/L   Total Bilirubin 0.3 0.3 - 1.2 mg/dL   GFR calc non Af Amer >60 >60 mL/min   GFR calc Af Amer >60 >60 mL/min   Anion gap 8 5 - 15    Comment: Performed at [facility], [place] [address], [place], [name] [address]  Ethanol     Status: None   Collection Time: 01/13/19  6:41 AM  Result Value Ref Range   Alcohol, Ethyl (B) <10 <10 mg/dL    Comment: (NOTE) Lowest detectable limit for serum alcohol is 10 mg/dL. For medical purposes only. Performed at [facility], [place] [address] [place], [place] [address]   TSH     Status: None   Collection Time: 01/13/19  6:41 AM  Result Value Ref Range   TSH 1.210 0.350 - 4.500 uIU/mL    Comment: Performed by a 3rd Generation assay with a functional sensitivity of <=0.01 uIU/mL. Performed at [facility], [place] [address] [place], [place] [address]   Glucose, capillary     Status: Abnormal   Collection Time: 01/13/19  5:23 PM  Result Value Ref Range   Glucose-Capillary 109 (H) 70 - 99 mg/dL  Glucose, capillary      Status: Abnormal   Collection Time: 01/14/19  6:24 AM  Result Value Ref Range   Glucose-Capillary 136 (H) 70 - 99 mg/dL    Blood Alcohol level:  Lab Results  Component Value Date   ETH <10 01/13/2019   ETH <10 42/68/3419    Metabolic Disorder Labs: Lab Results  Component Value Date   HGBA1C 6.6 (H) 10/25/2018   MPG 142.72 10/25/2018   MPG 125.5 10/03/2017   No results found for: PROLACTIN Lab Results  Component Value Date   CHOL 143 10/26/2018   TRIG 125 10/26/2018   HDL 41 10/26/2018   CHOLHDL 3.5 10/26/2018   VLDL 25 10/26/2018   LDLCALC 77 10/26/2018   LDLCALC 62 10/03/2017  Physical Findings: AIMS:  , ,  ,  ,    CIWA:    COWS:     Musculoskeletal: Strength & Muscle Tone: within normal limits Gait & Station: normal Patient leans: N/A  Psychiatric Specialty Exam: Physical Exam  Nursing note and vitals reviewed.   ROS  Blood pressure (!) 143/101, pulse 81, temperature 98 F (36.7 C), resp. rate 18, SpO2 99 %.There is no height or weight on file to calculate BMI.  General Appearance: Fairly Groomed  Eye Contact:  Good  Speech:  Clear and Coherent and Normal Rate  Volume:  Normal  Mood:  Depressed  Affect:  Congruent  Thought Process:  Coherent, Goal Directed, Linear and Descriptions of Associations: Intact  Orientation:  Full (Time, Place, and Person)  Thought Content:  WDL  Suicidal Thoughts:  No  Homicidal Thoughts:  No  Memory:  Immediate;   Fair Recent;   Fair  Judgement:  Fair  Insight:  Fair  Psychomotor Activity:  Normal  Concentration:  Concentration: Fair and Attention Span: Fair  Recall:  [employer] of Knowledge:  Fair  Language:  Good  Akathisia:  Negative  Handed:  Right  AIMS (if indicated):     Assets:  Communication Skills Desire for Improvement Resilience  ADL's:  Intact  Cognition:  WNL  Sleep:  Number of Hours: 6.5     Treatment Plan Summary: Daily contact with patient to assess and evaluate symptoms and progress  in treatment   Medication management: Patient continues to endorse some depression although with some improvement. He denies SI, HI or psychosis. He denies any withdrawal symptoms.To reduce current symptoms to base line and improve the patient's overall level of functioning will continue the following plan with adjustments noted as below.   Abilify 2 mg po daily for mood stabilization and depression Remeron 15 mg po daily at bedtime for depression Trazodone 50 mg po daily at bedtime as needed for insomnia Librium protocol for alcohol withdrawal  Resumed home medications for medical conditions. His Brilinta 90 mg was restarted and Lisinopril discontinued as he reported he was no longer taking. Metoprolol 50 mg po BID was restarted as he noted this as current PTA medication for HTN.  His BP ws 143/101.     Other:  Safety: Will continue 15 minute observation for safety checks. Patient is able to contract for safety on the unit at this time  Continue to develop treatment plan to decrease risk of relapse upon discharge and to reduce the need for readmission.  Psycho-social education regarding relapse prevention and self care.  Health care follow up as needed for medical problems.  Continue to attend and participate in therapy.     Mordecai Maes, NP 01/14/2019, 2:02 PM

## 2019-01-14 NOTE — [facility] Group Notes (Signed)
[facility] Group Notes:  (Nursing/MHT/Case Management/Adjunct)  Date:  01/14/2019  Time:  1:15 PM  Type of Therapy:  Nurse Education  Participation Level:  Did Not Attend   Baron Sane 01/14/2019, 3:28 PM

## 2019-01-14 NOTE — Progress Notes (Signed)
D: Pt was in dayroom upon initial approach.  Pt presents with depressed affect and mood.  He reports feeling "better" today compared to yesterday and reports he feels like he is gradually improving regarding his mood.  He states "now I'm more active, I've been reading, I've been in the dayroom, it was my first time having group."  Pt denies SI/HI, denies hallucinations, denies pain.  Pt has been visible in milieu interacting with peers and staff appropriately.    A: Met with pt 1:1.  Actively listened to pt and offered support and encouragement. Medications administered per order.  PRN medication administered for sleep.  15 minute safety checks in place.  R: Pt is safe on the unit.  Pt is compliant with medications.  Pt verbally contracts for safety.  Will continue to monitor and assess.
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
## 2019-01-14 NOTE — Plan of Care (Signed)
  Problem: Education: Goal: Knowledge of [facility] Education information/materials will improve Outcome: Progressing   

## 2019-01-14 NOTE — Progress Notes (Signed)
D Patient is observed by this writer OOB UAL on the [address] today. He is flat, depressed and he endorses a quiet affect. HE wears hospital-issue patient scrubs.      A He takes his scheduled meds as planned. HE completed his dialy assessment and on this he wrote he deneid SI today and he rated his depression, hoepelssness and anxiety " 7/5/2", respectively. He will speak to his physician today about possible starting him on brilenta ( he says this is a maintenance drug he takes at home QD.     R Safety is in place

## 2019-01-15 LAB — GLUCOSE, CAPILLARY: Glucose-Capillary: 140 mg/dL — ABNORMAL HIGH (ref 70–99)

## 2019-01-15 NOTE — Progress Notes (Signed)
[facility] MD Progress Note  01/15/2019 7:59 AM [name]  MRN:  [id number]  Subjective:  " Mentally, I feel better but they forgot to put me back on another one of my medications."  Evaluation on the unit: Face to face evaluation competed and chart reviewed. In brief;  [name] is an 58 y.o. male who initially  Presented to the unit with depression, suicidal thoughts, anhedonia, apathy.   During this evaluation, he is alert and oriented x3, calm and cooperative. Patient continues to endorse overall improvement in depressed mood. He denies having thoughts of wanting to harm himself or others and further denies any psychotic symptoms.  He, as yesterday, is concerned about some of his medications not be started reporting he would like to resume his Livalo for hypercholesterolemia. This is noted as a PTA although this is not in the pharmacy. An alternative is Pravachol and I will discuss with him if he would like to switch it for now or just resume the Livalo when discharged. He denies concerns with sleep or appetite. He denies medication related side effects. He endorse feeling more active and he continues to participate in unit milieu. He denies withdrawal symptoms.  He is contracting for safety at this time.     Principal Problem: <principal problem not specified> Diagnosis: Active Problems:   MDD (major depressive disorder)  Total Time spent with patient: 20 minutes  Past Psychiatric History: last admit to [facility] 07/2018. History of alcohol abuse,    Past Medical History:  Past Medical History:  Diagnosis Date  . Allergies   . Arthritis   . Chronic lower back pain   . Coronary artery disease    a. Multiple prior caths/PCI. Cath 2013 with possible spasm of RCA, 70% ISR of mid LCx with subsequent DES to mLCx and prox LCX. b. H/o microvascular angina. c. Recurrent angina 08/2014 - s/p PTCA/DES to prox Cx, PTCA/CBA to OM1.  c. LHC 06/10/15 with patent stents and some ISR in LCX and OM-1 that  was not flow limiting --> Rx   . Dyslipidemia    a. Intolerant to many statins except tolerating Livalo.  Marland Kitchen GERD (gastroesophageal reflux disease)   . Hypertension   . Myocardial infarction ([place]) ~ 2010  . S/P angioplasty with stent, DES, to proximal and mid LCX 12/15/11 12/15/2011  . Shoulder pain   . Stroke [facility])    pt. reports had a stroke around time of MI 2010  . Type II diabetes mellitus ([place])     Past Surgical History:  Procedure Laterality Date  . CARDIAC CATHETERIZATION  06/15/2002   LAD with prox 40% stenosis, norma L main, Cfx with 25% lesion, RCA with long mid 25% stenosis (Dr. Vita Barley)  . CARDIAC CATHETERIZATION  04/01/2010   normal L main, LAD wit mild stenosis, L Cfx with 70% in-stent restenosis, RCA with 70% in-stent restenosis, LVEF >60% (Dr. K. Mali Hilty) - cutting ballon arthrectomy to RCA & Cfx (Dr. Rockne Menghini)  . CARDIAC CATHETERIZATION  08/25/2010   preserved global LV contractility; multivessel CAD, diffuse 90-95% in-stent restenosis in prox placed Cfx stent - cutting balloon arthrectomy in Cfx with multiple dilatations 90-95% to 0% stenosis (Dr. Corky Downs)  . CARDIAC CATHETERIZATION  01/26/2011   PCI & stenting of aggresive in-stent restenosis within previously stented AV groove Cfx with 3.0x75mm Taxus DES (previous stents were Promus) (Dr. Adora Fridge)  . CARDIAC CATHETERIZATION  05/11/2011   preserved LV function, 40% mid LAD stenosis, 30-40%  narrowing proximal to stented semgnet of prox Cfx, patent mid RCA stent with smooth 20% narrowing in distal RCA (Dr. Corky Downs)  . CARDIAC CATHETERIZATION  12/15/2011   PCI & stenting of proximal & mid Cfx with DES - 3.0x50mm in proximal, 3.0x53mm in mid (Dr. Adora Fridge)  . CARDIAC CATHETERIZATION N/A 06/10/2015   Procedure: Left Heart Cath and Coronary Angiography;  Surgeon: Jolaine Artist, MD;  Location: [place] [facility];  Service: Cardiovascular;  Laterality: N/A;  . CARDIAC CATHETERIZATION  10/25/2018  . cardiometabolic  testing  1/61/0960   good exercise effort, peak VO2 79% predicted with normal VO2 HR curves (mild deconditioning)  . COLONOSCOPY  12/2012   diminutive hyperplastic sigmoid poyp so repeat routine 2024  . CORONARY BALLOON ANGIOPLASTY N/A 10/25/2018   Procedure: CORONARY BALLOON ANGIOPLASTY;  Surgeon: Jettie Booze, MD;  Location: [place] [facility];  Service: Cardiovascular;  Laterality: N/A;  . EXCISIONAL HEMORRHOIDECTOMY  1984  . LEFT HEART CATH AND CORONARY ANGIOGRAPHY N/A 10/25/2018   Procedure: LEFT HEART CATH AND CORONARY ANGIOGRAPHY;  Surgeon: Jettie Booze, MD;  Location: [place] [facility];  Service: Cardiovascular;  Laterality: N/A;  . LEFT HEART CATHETERIZATION WITH CORONARY ANGIOGRAM N/A 05/11/2011   Procedure: LEFT HEART CATHETERIZATION WITH CORONARY ANGIOGRAM;  Surgeon: Troy Sine, MD;  Location: [facility] CATH LAB;  Service: Cardiovascular;  Laterality: N/A;  Possible percutaneous coronary intervention, possible IVUS  . LEFT HEART CATHETERIZATION WITH CORONARY ANGIOGRAM N/A 12/15/2011   Procedure: LEFT HEART CATHETERIZATION WITH CORONARY ANGIOGRAM;  Surgeon: Lorretta Harp, MD;  Location: [facility] CATH LAB;  Service: Cardiovascular;  Laterality: N/A;  . LEFT HEART CATHETERIZATION WITH CORONARY ANGIOGRAM N/A 09/05/2014   Procedure: LEFT HEART CATHETERIZATION WITH CORONARY ANGIOGRAM;  Surgeon: Burnell Blanks, MD;  Location: [facility] CATH LAB;  Service: Cardiovascular;  Laterality: N/A;  . LIPOMA EXCISION     back of the head  . NM MYOCAR PERF WALL MOTION  02/2012   lexiscan myoview; mild perfusion defect in mid inferolateral & basal inferolateral region (infarct/scar); EF 52%, abnormal but ow risk scan  . PERCUTANEOUS CORONARY STENT INTERVENTION (PCI-S)  09/05/2014   Procedure: PERCUTANEOUS CORONARY STENT INTERVENTION (PCI-S);  Surgeon: Burnell Blanks, MD;  Location: [facility] CATH LAB;  Service: Cardiovascular;;   Family History:  Family History  Problem Relation Age of Onset   . Leukemia Mother   . Prostate cancer Father   . Coronary artery disease Paternal Grandmother   . Cancer Paternal Grandfather   . Cancer Brother    Family Psychiatric  History: Unaware of family history of mental health illness  Social History:  Social History   Substance and Sexual Activity  Alcohol Use Yes     Social History   Substance and Sexual Activity  Drug Use No   Comment: 12/15/11 "last cocaine was 2010"    Social History   Socioeconomic History  . Marital status: Divorced    Spouse name: Not on file  . Number of children: 2  . Years of education: GED  . Highest education level: Not on file  Occupational History  . Not on file  Social Needs  . Financial resource strain: Not on file  . Food insecurity    Worry: Not on file    Inability: Not on file  . Transportation needs    Medical: Not on file    Non-medical: Not on file  Tobacco Use  . Smoking status: Former Smoker    Packs/day: 1.00  Years: 10.00    Pack years: 10.00    Types: Cigarettes    Quit date: 10/07/2018    Years since quitting: 0.2  . Smokeless tobacco: Never Used  Substance and Sexual Activity  . Alcohol use: Yes  . Drug use: No    Comment: 12/15/11 "last cocaine was 2010"  . Sexual activity: Not Currently  Lifestyle  . Physical activity    Days per week: Not on file    Minutes per session: Not on file  . Stress: Not on file  Relationships  . Social Herbalist on phone: Not on file    Gets together: Not on file    Attends religious service: Not on file    Active member of club or organization: Not on file    Attends meetings of clubs or organizations: Not on file    Relationship status: Not on file  Other Topics Concern  . Not on file  Social History Narrative  . Not on file   Additional Social History:    Pain Medications: See MAR Prescriptions: See MAR- has been taking inconsistently at home Over the Counter: See MAR History of alcohol / drug use?: Yes Name  of Substance 1: alcohol, gin 1 - Amount (size/oz): gallon 1 - Frequency: 2 day binges q 3 weeks 1 - Last Use / Amount: 2 weeks ago                  Sleep: Fair  Appetite:  Fair  Current Medications: Current Facility-Administered Medications  Medication Dose Route Frequency Provider Last Rate Last Dose  . acetaminophen (TYLENOL) tablet 650 mg  650 mg Oral Q6H PRN Mordecai Maes, NP   650 mg at 01/13/19 1926  . alum & mag hydroxide-simeth (MAALOX/MYLANTA) 200-200-20 MG/5ML suspension 30 mL  30 mL Oral Q4H PRN Mordecai Maes, NP      . amLODipine (NORVASC) tablet 10 mg  10 mg Oral Daily Sharma Covert, MD   10 mg at 01/15/19 0731  . ARIPiprazole (ABILIFY) tablet 2 mg  2 mg Oral QHS Sharma Covert, MD   2 mg at 01/14/19 2104  . aspirin EC tablet 81 mg  81 mg Oral Daily Sharma Covert, MD   81 mg at 01/15/19 0731  . chlordiazePOXIDE (LIBRIUM) capsule 25 mg  25 mg Oral QID PRN Sharma Covert, MD      . folic acid (FOLVITE) tablet 1 mg  1 mg Oral Daily Sharma Covert, MD   1 mg at 01/15/19 0731  . metFORMIN (GLUCOPHAGE-XR) 24 hr tablet 500 mg  500 mg Oral Q breakfast Sharma Covert, MD   500 mg at 01/15/19 0731  . metoprolol tartrate (LOPRESSOR) tablet 50 mg  50 mg Oral BID Sharma Covert, MD   50 mg at 01/15/19 0731  . mirtazapine (REMERON) tablet 15 mg  15 mg Oral QHS Sharma Covert, MD   15 mg at 01/14/19 2104  . thiamine (VITAMIN B-1) tablet 100 mg  100 mg Oral Daily Sharma Covert, MD   100 mg at 01/15/19 0732  . ticagrelor (BRILINTA) tablet 90 mg  90 mg Oral BID Sharma Covert, MD   90 mg at 01/15/19 0731  . traZODone (DESYREL) tablet 50 mg  50 mg Oral QHS PRN Sharma Covert, MD   50 mg at 01/14/19 2103    Lab Results:  Results for orders placed or performed during the hospital encounter of 01/12/19 (  from the past 48 hour(s))  Glucose, capillary     Status: Abnormal   Collection Time: 01/13/19  5:23 PM  Result Value Ref Range    Glucose-Capillary 109 (H) 70 - 99 mg/dL  Glucose, capillary     Status: Abnormal   Collection Time: 01/14/19  6:24 AM  Result Value Ref Range   Glucose-Capillary 136 (H) 70 - 99 mg/dL  Glucose, capillary     Status: Abnormal   Collection Time: 01/15/19  5:51 AM  Result Value Ref Range   Glucose-Capillary 140 (H) 70 - 99 mg/dL   Comment 1 Notify RN    Comment 2 Document in Chart     Blood Alcohol level:  Lab Results  Component Value Date   ETH <10 01/13/2019   ETH <10 29/56/2130    Metabolic Disorder Labs: Lab Results  Component Value Date   HGBA1C 6.6 (H) 10/25/2018   MPG 142.72 10/25/2018   MPG 125.5 10/03/2017   No results found for: PROLACTIN Lab Results  Component Value Date   CHOL 143 10/26/2018   TRIG 125 10/26/2018   HDL 41 10/26/2018   CHOLHDL 3.5 10/26/2018   VLDL 25 10/26/2018   LDLCALC 77 10/26/2018   LDLCALC 62 10/03/2017    Physical Findings: AIMS:  , ,  ,  ,    CIWA:    COWS:     Musculoskeletal: Strength & Muscle Tone: within normal limits Gait & Station: normal Patient leans: N/A  Psychiatric Specialty Exam: Physical Exam  Nursing note and vitals reviewed.   ROS  Blood pressure (!) 143/111, pulse 78, temperature 98 F (36.7 C), temperature source Oral, resp. rate 18, SpO2 98 %.There is no height or weight on file to calculate BMI.  General Appearance: Fairly Groomed  Eye Contact:  Good  Speech:  Clear and Coherent and Normal Rate  Volume:  Normal  Mood:  Depressed but improving.   Affect:  Congruent  Thought Process:  Coherent, Goal Directed, Linear and Descriptions of Associations: Intact  Orientation:  Full (Time, Place, and Person)  Thought Content:  WDL  Suicidal Thoughts:  No  Homicidal Thoughts:  No  Memory:  Immediate;   Fair Recent;   Fair  Judgement:  Fair  Insight:  Fair  Psychomotor Activity:  Normal  Concentration:  Concentration: Fair and Attention Span: Fair  Recall:  [employer] of Knowledge:  Fair  Language:   Good  Akathisia:  Negative  Handed:  Right  AIMS (if indicated):     Assets:  Communication Skills Desire for Improvement Resilience  ADL's:  Intact  Cognition:  WNL  Sleep:  Number of Hours: 6.25     Treatment Plan Summary: Daily contact with patient to assess and evaluate symptoms and progress in treatment   Medication management: Patient endorses continued improvement in depression. He denies SI, HI or psychosis. He denies any withdrawal symptoms.To continue to reduce current symptoms to base line and improve the patient's overall level of functioning will continue the following plan as below.    Abilify 2 mg po daily for mood stabilization and depression Remeron 15 mg po daily at bedtime for depression Trazodone 50 mg po daily at bedtime as needed for insomnia Librium protocol for alcohol withdrawal  Resumed home medications for medical conditions. Will discuss with him Pravachol instead of Livalo for cholesterol as this is not available in the pharmacy. If he agree, will start Pravachol.      Other:  Safety: Will continue 15  minute observation for safety checks. Patient is able to contract for safety on the unit at this time  Continue to develop treatment plan to decrease risk of relapse upon discharge and to reduce the need for readmission.  Psycho-social education regarding relapse prevention and self care.  Health care follow up as needed for medical problems.  Continue to attend and participate in therapy.     Mordecai Maes, NP 01/15/2019, 7:59 AMPatient ID: Lawson Fiscal, male   DOB: 1961-03-12, 58 y.o.   MRN: [id number]

## 2019-01-15 NOTE — BHH Group Notes (Signed)
Life SKills  Date:  01/15/2019  Time:  1300  Type of Therapy:  Nurse Education   :   The group is focused on teaching patients how to identiify their unhealthy coping skills and then how to develop and practice newer and healtheir ways of getting their needs met.  Participation Level:  Did Not Attend  Participation Quality:    Affect:    Cognitive:    Insight:    Engagement in Group:    Modes of Intervention:    Summary of Progress/Problems:  Lauralyn Primes 01/15/2019, 4:44 PM

## 2019-01-15 NOTE — BHH Group Notes (Addendum)
[facility] LCSW Group Therapy Note  Date/Time:  01/15/2019 9:00-10:00 or 10:00-11:00AM  Type of Therapy and Topic:  Group Therapy:  Healthy and Unhealthy Supports  Participation Level:  Active   Description of Group:  Patients in this group were introduced to the idea of adding a variety of healthy supports to address the various needs in their lives.Patients discussed what additional healthy supports could be helpful in their recovery and wellness after discharge in order to prevent future hospitalizations.   An emphasis was placed on using counselor, doctor, therapy groups, 12-step groups, and problem-specific support groups to expand supports.  They also worked as a group on developing a specific plan for several patients to deal with unhealthy supports through [place], psychoeducation with loved ones, and even termination of relationships.   Therapeutic Goals:   1)  discuss importance of adding supports to stay well once out of the hospital  2)  compare healthy versus unhealthy supports and identify some examples of each  3)  generate ideas and descriptions of healthy supports that can be added  4)  offer mutual support about how to address unhealthy supports  5)  encourage active participation in and adherence to discharge plan    Summary of Patient Progress:  The patient stated that current healthy supports in his life are his spirituality, positive people and a healthy lifestyle  while current unhealthy supports include the times he strays from this formula.  The patient expressed a willingness to add patience as support(s) to help in his recovery journey.   Therapeutic Modalities:   Motivational Interviewing Brief Solution-Focused Therapy  Rolanda Jay

## 2019-01-15 NOTE — Progress Notes (Signed)
D.  Pt pleasant on approach, no complaints voiced.  Pt states that he should be on medication for high cholesterol and wants to be sure that this is started.  Pt was positive for evening wrap up group, observed engaged in appropriate interaction with peers on the unit.  Pt denies SI/HI/AVH at this time.  A.  Support and encouragement offered, medication given as ordered  R. Pt remains safe on the unit, will continue to monitor.

## 2019-01-15 NOTE — Progress Notes (Addendum)
   Patient has agreed to switch to Pravachol as Livalo is not available. I did speak with patient as a allergies notification popped up (allergies to stains). Patient stated stains due cause some muscle aches however, he still would like to start the medication.

## 2019-01-15 NOTE — BHH Group Notes (Signed)
[place] Group Notes:  (Nursing) Date:  01/15/2019  Time: 100 PM Type of Therapy:  Nurse Education  Participation Level:  Did Not Attend   Waymond Cera 01/15/2019, 1:29 PM

## 2019-01-15 NOTE — Progress Notes (Signed)
D:  Patient denied SI and HI, contracts for safety.  Denied A/V hallucinations.  Denied pain.  Patient rated anxiety and hopeless 1, depression 5.  Patient stated he plans to return home after discharge. A:  Medications administered per MD orders.  Emotional support and encouragement given patient. R:  Safety maintained with 15 minute checks.

## 2019-01-15 NOTE — Progress Notes (Signed)
[facility] Group Notes:  (Nursing/MHT/Case Management/Adjunct)  Date:  01/15/2019  Time:  2030 Type of Therapy:  wrap up group  Participation Level:  Active  Participation Quality:  Appropriate, Attentive, Sharing and Supportive  Affect:  Appropriate  Cognitive:  Appropriate  Insight:  Improving  Engagement in Group:  Engaged  Modes of Intervention:  Clarification, Education and Support  Summary of Progress/Problems: Pt reports feeling mentally and emotionally better. Pt attributes mood increase to medicine working. Pt plans on not drinking after discharge and lives in a sober home. Pt is grateful for God.   Shellia Cleverly 01/15/2019, 11:26 PM

## 2019-01-15 NOTE — Plan of Care (Signed)
Nurse discussed anxiety, depression and coping skills with patient.  

## 2019-01-16 ENCOUNTER — Encounter (HOSPITAL_COMMUNITY): Payer: Medicaid Other

## 2019-01-16 LAB — GLUCOSE, CAPILLARY: Glucose-Capillary: 98 mg/dL (ref 70–99)

## 2019-01-16 MED ORDER — MIRTAZAPINE 15 MG PO TABS: 15.0000 mg | ORAL_TABLET | Freq: Every day | ORAL | 3 refills | Status: DC

## 2019-01-16 MED ORDER — ARIPIPRAZOLE 2 MG PO TABS: 2.0000 mg | ORAL_TABLET | Freq: Every day | ORAL | 3 refills | Status: DC

## 2019-01-16 MED ORDER — AMLODIPINE BESYLATE 10 MG PO TABS: 10.0000 mg | ORAL_TABLET | Freq: Every day | ORAL | 1 refills | Status: DC

## 2019-01-16 MED ORDER — METOPROLOL TARTRATE 100 MG PO TABS: 100.0000 mg | ORAL_TABLET | Freq: Two times a day (BID) | ORAL | 2 refills | Status: DC

## 2019-01-16 MED ORDER — METOPROLOL TARTRATE 50 MG PO TABS
100.0000 mg | ORAL_TABLET | Freq: Two times a day (BID) | ORAL | Status: DC
  Filled 2019-01-16 (×2): qty 28
  Filled 2019-01-16 (×2): qty 1

## 2019-01-16 NOTE — Progress Notes (Signed)
  [facility] Adult Case Management Discharge Plan :  Will you be returning to the same living situation after discharge:  Yes,  home At discharge, do you have transportation home?: Yes,  taking the bus Do you have the ability to pay for your medications: Yes,  has Medicaid.  Release of information consent forms completed and in the chart; letter on chart.  Patient to Follow up at: Follow-up Information    [facility] Follow up.   Why: You have been accepted for treatment. At this time, you are currently on the waiting list. Please follow up with Admission Coordinator, June on the status for possible admission.  Contact information: [address] [place] [address] [phone]        [facility] Follow up on 01/18/2019.   Why: Hospital follow up appointment is Wend[place]day, 7/22 at 1:00p.  Please bring your photo ID, insurance card, SSN, current medications and discharge paperwork from this hospitalization.  Contact information: [place] [phone] [phone]           Next level of care provider has access to [facility] and Suicide Prevention discussed: Yes,  with patient. Patient declined consents.     Has patient been referred to the Quitline?: N/A patient is not a smoker  Patient has been referred for addiction treatment: Yes  Joellen Jersey, [place] 01/16/2019, 10:55 AM

## 2019-01-16 NOTE — BHH Suicide Risk Assessment (Signed)
[facility] Discharge Suicide Risk Assessment   Principal Problem: <principal problem not specified> Discharge Diagnoses: Active Problems:   MDD (major depressive disorder)   Total Time spent with patient: 45 minutes  Musculoskeletal: Strength & Muscle Tone: within normal limits Gait & Station: normal Patient leans: N/A  Psychiatric Specialty Exam: ROS  Blood pressure (!) 163/100, pulse 72, temperature 98.5 F (36.9 C), temperature source Oral, resp. rate 16, SpO2 100 %.There is no height or weight on file to calculate BMI.  General Appearance: Casual  Eye Contact::  Good  Speech:  Clear and Coherent409  Volume:  Normal  Mood:  Euthymic  Affect:  Congruent  Thought Process:  Coherent and Linear  Orientation:  Full (Time, Place, and Person)  Thought Content:  Rumination  Suicidal Thoughts:  No  Homicidal Thoughts:  No  Memory:  fair  Judgement:  Good  Insight:  Good  Psychomotor Activity:  Normal  Concentration:  Good  Recall:  Good  Fund of Knowledge:Good  Language: Good  Akathisia:  Negative  Handed:  Right  AIMS (if indicated):     Assets:  Communication Skills Desire for Improvement  Sleep:  Number of Hours: 5.5  Cognition: WNL  ADL's:  Intact   Mental Status Per Nursing Assessment::   On Admission:  Suicidal ideation indicated by patient  Demographic Factors:  Male  Loss Factors: NA  Historical Factors: NA  Risk Reduction Factors:   Sense of responsibility to family and Religious beliefs about death  Continued Clinical Symptoms:  Dysthymia  Cognitive Features That Contribute To Risk:  None    Suicide Risk:  Minimal: No identifiable suicidal ideation.  Patients presenting with no risk factors but with morbid ruminations; may be classified as minimal risk based on the severity of the depressive symptoms  Follow-up Information    [facility] Follow up.   Why: You have been accepted for treatment. At this time, you are currently on the  waiting list. Please follow up with Admission Coordinator, June on the status for possible admission.  Contact information: [address] [place] [address] [phone]           Plan Of Care/Follow-up recommendations:  Activity:  full  Lajuanna Pompa, MD 01/16/2019, 10:30 AM

## 2019-01-16 NOTE — Progress Notes (Signed)
Pt discharged to lobby. Pt was stable and appreciative at that time. All papers, samples and prescriptions were given and valuables returned. Verbal understanding expressed. Denies SI/HI and A/VH. Pt given opportunity to express concerns and ask questions.  

## 2019-01-16 NOTE — Care Management (Signed)
Patient is on the waiting list for treatment at [facility] has notified [place], [place].      Kelse Ploch Care Management Assistant  Email:[email or web address] Office: [phone]

## 2019-01-16 NOTE — Discharge Summary (Signed)
Physician Discharge Summary Note  Patient:  [name] is an 58 y.o., male MRN:  [id number] DOB:  [birth year] Patient phone:  [phone] (home)  Patient address:   [place] [address],  Total Time spent with patient: 45 minutes  Date of Admission:  01/12/2019 Date of Discharge: 01/16/2019  Reason for Admission:   This is a repeat admission for [name] well-known to the service last here in February, once again he presents with depression, suicidal thoughts, anhedonia, apathy, and as discussed below he felt it best to receive treatment.  He lives at home alone he states he has been drinking but does not believe he needs a full detox regimen.  He has been generally sober with some relapses intermittently but he is vague about the amounts.  Has a history of hypertension type 2 diabetes history of MI he does not believe he has post MI depression in the past just recurrent and severe depression independent of that. He does not have auditory or visual hallucinations. Despite recent suicidal thoughts can contract for safety here  According to our assessment team note of yesterday  [name] an 58 y.o.malewho presents voluntarily to [facility] for a walk-in assessment. Pt isreporting symptoms of depression and while he doesn't want to state he is havingsuicidalthoughts, he admits "it wouldn't be a bad idea". Pt has a history ofMajor Depressive Disorder dx with 2 past suicide attempts. Pt states he was encouraged by his sister, who is a Social worker, to come for assessment.When pt stated he couldn't leave his apartment in such an untidy state, sister came over to help clean. Pt states his sister was surprised at the state of pt's home given he is usually neat.  Pt reportsintermittentmedicationcompliance. He states he has all his medications, just isn't motivated to take them lately.Pt reportspastsuicideattempts included an overdose on meds & intentionally  driving his car off a bridge.Pt reports he owns a gun. Pt has medical dx of Type 2 Diabetes, chronic pain & heart condition. Pt states he knows he is self-harming when he isn't careful with his diet. He states he has gained 20 lbs from eating sugary & fatty foods over the past few months.  Pt acknowledgesmultiplesymptomsof Depression.He reports vaguehomicidal ideation towards a friend from [place] who crossed him. Pt denies homicidal plan. Ptdenies AVH &other psychotic symptoms. Pt states current stressors include loss of his routines with Covid quarantine. When pt stopped drinking alcohol, he became active in working out at the gym. He also regularly attended [place] meetings.Those habits have been central to pt staying well. AA meeting have continued online, & pt attempted to remain active but didn't find it helpful.  Pt livesalone, and supports includehis sister, [name] [phone]. Pt gave verbal permission to speak with sister.Pt denies hxof abuse and trauma. Pt reports there is a family history of Depression and suicide. 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 Pt hasgoodinsight and fairjudgment. Pt's memory is intact.Pt has not current legalcharges or probation. ? [facility] but [facility] hasn't [facility] in months. [facility]. [facility].  Ptreports recent 2-day binges ofalcohol(about a gallon of Gin) about ever 3 weeks. ? MSE: Pt is casually dressed, alert, oriented x4 with normal speech and normal motor behavior. Eye contact is good. Pt's mood is depressed and affect is depressed andconstricted. Affect is congruent with mood. Thought process is coherent and relevant. There is no indication Pt is  currently responding to internal stimuli or experiencing delusional thought content. Pt was cooperative throughout assessment.  Principal Problem: <principal problem not specified> Discharge Diagnoses:  Active Problems:   MDD (major depressive disorder)   Past Psychiatric History: see above  Past Medical History:  Past Medical History:  Diagnosis Date  . Allergies   . Arthritis   . Chronic lower back pain   . Coronary artery disease    a. Multiple prior caths/PCI. Cath 2013 with possible spasm of RCA, 70% ISR of mid LCx with subsequent DES to mLCx and prox LCX. b. H/o microvascular angina. c. Recurrent angina 08/2014 - s/p PTCA/DES to prox Cx, PTCA/CBA to OM1.  c. LHC 06/10/15 with patent stents and some ISR in LCX and OM-1 that was not flow limiting --> Rx   . Dyslipidemia    a. Intolerant to many statins except tolerating Livalo.  Marland Kitchen GERD (gastroesophageal reflux disease)   . Hypertension   . Myocardial infarction ([facility]) ~ 2010  . S/P angioplasty with stent, DES, to proximal and mid LCX 12/15/11 12/15/2011  . Shoulder pain   . Stroke [facility])    pt. reports had a stroke around time of MI 2010  . Type II diabetes mellitus ([place])     Past Surgical History:  Procedure Laterality Date  . CARDIAC CATHETERIZATION  06/15/2002   LAD with prox 40% stenosis, norma L main, Cfx with 25% lesion, RCA with long mid 25% stenosis (Dr. Vita Barley)  . CARDIAC CATHETERIZATION  04/01/2010   normal L main, LAD wit mild stenosis, L Cfx with 70% in-stent restenosis, RCA with 70% in-stent restenosis, LVEF >60% (Dr. K. Mali Hilty) - cutting ballon arthrectomy to RCA & Cfx (Dr. Rockne Menghini)  . CARDIAC CATHETERIZATION  08/25/2010   preserved global LV contractility; multivessel CAD, diffuse 90-95% in-stent restenosis in prox placed Cfx stent - cutting balloon arthrectomy in Cfx with multiple dilatations 90-95% to 0% stenosis (Dr. Corky Downs)  . CARDIAC CATHETERIZATION  01/26/2011   PCI & stenting of aggresive in-stent restenosis within previously stented AV groove Cfx with 3.0x8m Taxus DES (previous stents were Promus) (Dr. JAdora Fridge  . CARDIAC CATHETERIZATION  05/11/2011   preserved LV function, 40% mid LAD stenosis,  30-40% narrowing proximal to stented semgnet of prox Cfx, patent mid RCA stent with smooth 20% narrowing in distal RCA (Dr. TCorky Downs  . CARDIAC CATHETERIZATION  12/15/2011   PCI & stenting of proximal & mid Cfx with DES - 3.0x162min proximal, 3.0x1518mn mid (Dr. J. Adora Fridge. CARDIAC CATHETERIZATION N/A 06/10/2015   Procedure: Left Heart Cath and Coronary Angiography;  Surgeon: DanJolaine ArtistD;  Location: [facility];  Service: Cardiovascular;  Laterality: N/A;  . CARDIAC CATHETERIZATION  10/25/2018  . cardiometabolic testing  08/12/94/7591good exercise effort, peak VO2 79% predicted with normal VO2 HR curves (mild deconditioning)  . COLONOSCOPY  12/2012   diminutive hyperplastic sigmoid poyp so repeat routine 2024  . CORONARY BALLOON ANGIOPLASTY N/A 10/25/2018   Procedure: CORONARY BALLOON ANGIOPLASTY;  Surgeon: VarJettie BoozeD;  Location: [facility];  Service: Cardiovascular;  Laterality: N/A;  . EXCISIONAL HEMORRHOIDECTOMY  1984  . LEFT HEART CATH AND CORONARY ANGIOGRAPHY N/A 10/25/2018   Procedure: LEFT HEART CATH AND CORONARY ANGIOGRAPHY;  Surgeon: VarJettie BoozeD;  Location: [facility];  Service: Cardiovascular;  Laterality: N/A;  . LEFT HEART CATHETERIZATION WITH CORONARY ANGIOGRAM N/A 05/11/2011   Procedure: LEFT HEART CATHETERIZATION WITH  CORONARY ANGIOGRAM;  Surgeon: Troy Sine, MD;  Location: [facility] CATH LAB;  Service: Cardiovascular;  Laterality: N/A;  Possible percutaneous coronary intervention, possible IVUS  . LEFT HEART CATHETERIZATION WITH CORONARY ANGIOGRAM N/A 12/15/2011   Procedure: LEFT HEART CATHETERIZATION WITH CORONARY ANGIOGRAM;  Surgeon: Lorretta Harp, MD;  Location: [facility] CATH LAB;  Service: Cardiovascular;  Laterality: N/A;  . LEFT HEART CATHETERIZATION WITH CORONARY ANGIOGRAM N/A 09/05/2014   Procedure: LEFT HEART CATHETERIZATION WITH CORONARY ANGIOGRAM;  Surgeon: Burnell Blanks, MD;  Location: [facility] CATH LAB;  Service:  Cardiovascular;  Laterality: N/A;  . LIPOMA EXCISION     back of the head  . NM MYOCAR PERF WALL MOTION  02/2012   lexiscan myoview; mild perfusion defect in mid inferolateral & basal inferolateral region (infarct/scar); EF 52%, abnormal but ow risk scan  . PERCUTANEOUS CORONARY STENT INTERVENTION (PCI-S)  09/05/2014   Procedure: PERCUTANEOUS CORONARY STENT INTERVENTION (PCI-S);  Surgeon: Burnell Blanks, MD;  Location: [facility] CATH LAB;  Service: Cardiovascular;;   Family History:  Family History  Problem Relation Age of Onset  . Leukemia Mother   . Prostate cancer Father   . Coronary artery disease Paternal Grandmother   . Cancer Paternal Grandfather   . Cancer Brother    Family Psychiatric  History: see above Social History:  Social History   Substance and Sexual Activity  Alcohol Use Yes     Social History   Substance and Sexual Activity  Drug Use No   Comment: 12/15/11 "last cocaine was 2010"    Social History   Socioeconomic History  . Marital status: Divorced    Spouse name: Not on file  . Number of children: 2  . Years of education: GED  . Highest education level: Not on file  Occupational History  . Not on file  Social Needs  . Financial resource strain: Not on file  . Food insecurity    Worry: Not on file    Inability: Not on file  . Transportation needs    Medical: Not on file    Non-medical: Not on file  Tobacco Use  . Smoking status: Former Smoker    Packs/day: 1.00    Years: 10.00    Pack years: 10.00    Types: Cigarettes    Quit date: 10/07/2018    Years since quitting: 0.2  . Smokeless tobacco: Never Used  Substance and Sexual Activity  . Alcohol use: Yes  . Drug use: No    Comment: 12/15/11 "last cocaine was 2010"  . Sexual activity: Not Currently  Lifestyle  . Physical activity    Days per week: Not on file    Minutes per session: Not on file  . Stress: Not on file  Relationships  . Social Herbalist on phone: Not on file     Gets together: Not on file    Attends religious service: Not on file    Active member of club or organization: Not on file    Attends meetings of clubs or organizations: Not on file    Relationship status: Not on file  Other Topics Concern  . Not on file  Social History Narrative  . Not on file    Hospital Course:   Patient did well through the weekend he displayed no dangerous behaviors, he Progressed to a PRN detox regimen utilizing a CIWA scale and as needed Librium he was continued on antihypertensives, antidepressants and aripiprazole augmentation.  By the date of the  20th was alert oriented cooperative denied thoughts of harming self or others and was contracting fully, no cravings tremors or withdrawal and eager for discharge so he is discharged home but we did escalate his antihypertensive due to persistent hypertension despite adequate detox measures Physical Findings: AIMS: Facial and Oral Movements Muscles of Facial Expression: None, normal Lips and Perioral Area: None, normal Jaw: None, normal Tongue: None, normal,Extremity Movements Upper (arms, wrists, hands, fingers): None, normal Lower (legs, knees, ankles, toes): None, normal, Trunk Movements Neck, shoulders, hips: None, normal, Overall Severity Severity of abnormal movements (highest score from questions above): None, normal Incapacitation due to abnormal movements: None, normal Patient's awareness of abnormal movements (rate only patient's report): No Awareness, Dental Status Current problems with teeth and/or dentures?: No Does patient usually wear dentures?: No  CIWA:  CIWA-Ar Total: 1 COWS:  COWS Total Score: 1     Has this patient used any form of tobacco in the last 30 days? (Cigarettes, Smokeless Tobacco, Cigars, and/or Pipes) Yes, No  Blood Alcohol level:  Lab Results  Component Value Date   ETH <10 01/13/2019   ETH <10 19/62/2297    Metabolic Disorder Labs:  Lab Results  Component Value Date    HGBA1C 6.6 (H) 10/25/2018   MPG 142.72 10/25/2018   MPG 125.5 10/03/2017   No results found for: PROLACTIN Lab Results  Component Value Date   CHOL 143 10/26/2018   TRIG 125 10/26/2018   HDL 41 10/26/2018   CHOLHDL 3.5 10/26/2018   VLDL 25 10/26/2018   LDLCALC 77 10/26/2018   LDLCALC 62 10/03/2017    See Psychiatric Specialty Exam and Suicide Risk Assessment completed by Attending Physician prior to discharge.  Discharge destination:  Home  Is patient on multiple antipsychotic therapies at discharge:  No   Has Patient had three or more failed trials of antipsychotic monotherapy by history:  No  Recommended Plan for Multiple Antipsychotic Therapies: NA   Allergies as of 01/16/2019      Reactions   Bee Venom Anaphylaxis, Hives   Shellfish Allergy Anaphylaxis, Hives   Statins Other (See Comments)   Myalgias. Tolerating livalo.   Testosterone Cypionate    Testerone Injection --Increased breast tissue       Medication List    STOP taking these medications   ibuprofen 600 MG tablet Commonly known as: ADVIL   ibuprofen 800 MG tablet Commonly known as: ADVIL   isosorbide mononitrate 30 MG 24 hr tablet Commonly known as: IMDUR     TAKE these medications     Indication  Accu-Chek Aviva Plus test strip Generic drug: glucose blood Use up to 3 times daily  Indication: Diabetes   Accu-Chek Aviva Plus w/Device Kit Use up to 3 times daily  Indication: Diabetes   Accu-Chek FastClix Lancets Misc Use up to 3 times daily  Indication: Diabetes   amLODipine 10 MG tablet Commonly known as: NORVASC Take 1 tablet (10 mg total) by mouth daily. Start taking on: January 17, 2019  Indication: High Blood Pressure Disorder   ARIPiprazole 2 MG tablet Commonly known as: ABILIFY Take 1 tablet (2 mg total) by mouth at bedtime.  Indication: Major Depressive Disorder   aspirin 81 MG chewable tablet Chew 1 tablet (81 mg total) by mouth daily.  Indication: Pain   co-enzyme Q-10  30 MG capsule Take 30 mg by mouth daily.  Indication: Weight Loss   Livalo 4 MG Tabs Generic drug: Pitavastatin Calcium Take 1 tablet by mouth every morning.  Indication: Inherited Heterozygous Hypercholesterolemia   metFORMIN 500 MG 24 hr tablet Commonly known as: GLUCOPHAGE-XR Take 1 tablet (500 mg total) by mouth daily with breakfast. For diabetes management  Indication: Type 2 Diabetes   metoprolol tartrate 100 MG tablet Commonly known as: LOPRESSOR Take 1 tablet (100 mg total) by mouth 2 (two) times daily. What changed:   medication strength  how much to take  Indication: High Blood Pressure Disorder   mirtazapine 15 MG tablet Commonly known as: REMERON Take 1 tablet (15 mg total) by mouth at bedtime.  Indication: Major Depressive Disorder   nitroGLYCERIN 0.4 MG SL tablet Commonly known as: NITROSTAT Place 1 tablet (0.4 mg total) under the tongue every 5 (five) minutes x 3 doses as needed for chest pain.  Indication: Acute Angina Pectoris   ticagrelor 90 MG Tabs tablet Commonly known as: BRILINTA Take 1 tablet (90 mg total) by mouth 2 (two) times daily.  Indication: CORONARY ARTERY DISEASE      Follow-up Information    [facility] Follow up.   Why: You have been accepted for treatment. At this time, you are currently on the waiting list. Please follow up with Admission Coordinator, June on the status for possible admission.  Contact information: [address] [place] [address] [phone]        [facility] Follow up on 01/18/2019.   Why: Hospital follow up appointment is Wendesday, 7/22 at 1:00p.  Please bring your photo ID, insurance card, SSN, current medications and discharge paperwork from this hospitalization.  Contact information: [address] [place] [address] [phone]           Follow-up recommendations:  Activity:  full  SignedJohnn Hai, MD 01/16/2019, 10:39 AM

## 2019-01-18 ENCOUNTER — Encounter (HOSPITAL_COMMUNITY): Payer: Medicaid Other

## 2019-01-20 ENCOUNTER — Telehealth (HOSPITAL_COMMUNITY): Payer: Self-pay | Admitting: *Deleted

## 2019-01-20 ENCOUNTER — Encounter (HOSPITAL_COMMUNITY): Payer: Medicaid Other

## 2019-01-20 NOTE — Telephone Encounter (Signed)
Spoke with the patient regarding his recent hospital admission. Advised the patient that he get clearance from  His attending physician regarding clearance to come to phase 2 cardiac rehab. Patient states understanding. Barnet Pall, RN,BSN 01/20/2019 12:02 PM

## 2019-01-23 ENCOUNTER — Encounter (HOSPITAL_COMMUNITY): Payer: Medicaid Other

## 2019-01-25 ENCOUNTER — Encounter (HOSPITAL_COMMUNITY): Payer: Medicaid Other

## 2019-01-27 ENCOUNTER — Encounter (HOSPITAL_COMMUNITY): Payer: Medicaid Other

## 2019-01-30 ENCOUNTER — Encounter (HOSPITAL_COMMUNITY): Payer: Medicaid Other

## 2019-02-01 ENCOUNTER — Encounter (HOSPITAL_COMMUNITY): Payer: Medicaid Other

## 2019-02-03 ENCOUNTER — Encounter (HOSPITAL_COMMUNITY): Payer: Medicaid Other

## 2019-02-06 ENCOUNTER — Encounter (HOSPITAL_COMMUNITY): Payer: Medicaid Other

## 2019-02-08 ENCOUNTER — Encounter (HOSPITAL_COMMUNITY): Payer: Medicaid Other

## 2019-02-10 ENCOUNTER — Encounter (HOSPITAL_COMMUNITY): Payer: Medicaid Other

## 2019-02-13 ENCOUNTER — Encounter (HOSPITAL_COMMUNITY): Payer: Medicaid Other

## 2019-02-15 ENCOUNTER — Encounter (HOSPITAL_COMMUNITY): Payer: Medicaid Other

## 2019-02-17 ENCOUNTER — Encounter (HOSPITAL_COMMUNITY): Payer: Medicaid Other

## 2019-02-20 ENCOUNTER — Ambulatory Visit (HOSPITAL_COMMUNITY): Payer: Medicaid Other

## 2019-02-22 ENCOUNTER — Ambulatory Visit (HOSPITAL_COMMUNITY): Payer: Medicaid Other

## 2019-02-24 ENCOUNTER — Telehealth: Payer: Medicaid Other | Admitting: Internal Medicine

## 2019-03-15 ENCOUNTER — Other Ambulatory Visit: Payer: Self-pay | Admitting: Family Medicine

## 2019-03-20 ENCOUNTER — Other Ambulatory Visit: Payer: Self-pay | Admitting: Family Medicine

## 2019-03-20 DIAGNOSIS — R7303 Prediabetes: Secondary | ICD-10-CM

## 2019-03-26 ENCOUNTER — Emergency Department (HOSPITAL_COMMUNITY): Payer: Medicaid Other

## 2019-03-26 ENCOUNTER — Other Ambulatory Visit: Payer: Self-pay

## 2019-03-26 ENCOUNTER — Emergency Department (HOSPITAL_COMMUNITY)
Admission: EM | Admit: 2019-03-26 | Discharge: 2019-03-26 | Disposition: A | Discharge status: Home / Self Care | Payer: Medicaid Other | Attending: Emergency Medicine | Admitting: Emergency Medicine

## 2019-03-26 ENCOUNTER — Encounter (HOSPITAL_COMMUNITY): Payer: Self-pay

## 2019-03-26 DIAGNOSIS — I2510 Atherosclerotic heart disease of native coronary artery without angina pectoris: Secondary | ICD-10-CM | POA: Insufficient documentation

## 2019-03-26 DIAGNOSIS — E119 Type 2 diabetes mellitus without complications: Secondary | ICD-10-CM | POA: Insufficient documentation

## 2019-03-26 DIAGNOSIS — I10 Essential (primary) hypertension: Secondary | ICD-10-CM | POA: Diagnosis not present

## 2019-03-26 DIAGNOSIS — Z79899 Other long term (current) drug therapy: Secondary | ICD-10-CM | POA: Insufficient documentation

## 2019-03-26 DIAGNOSIS — I252 Old myocardial infarction: Secondary | ICD-10-CM | POA: Diagnosis not present

## 2019-03-26 DIAGNOSIS — Z20828 Contact with and (suspected) exposure to other viral communicable diseases: Secondary | ICD-10-CM | POA: Insufficient documentation

## 2019-03-26 DIAGNOSIS — Z7984 Long term (current) use of oral hypoglycemic drugs: Secondary | ICD-10-CM | POA: Diagnosis not present

## 2019-03-26 DIAGNOSIS — Z87891 Personal history of nicotine dependence: Secondary | ICD-10-CM | POA: Diagnosis not present

## 2019-03-26 DIAGNOSIS — R079 Chest pain, unspecified: Secondary | ICD-10-CM | POA: Diagnosis not present

## 2019-03-26 DIAGNOSIS — R05 Cough: Secondary | ICD-10-CM | POA: Diagnosis present

## 2019-03-26 DIAGNOSIS — Z7982 Long term (current) use of aspirin: Secondary | ICD-10-CM | POA: Diagnosis not present

## 2019-03-26 LAB — CBC
HCT: 44.3 % (ref 39.0–52.0)
Hemoglobin: 15.1 g/dL (ref 13.0–17.0)
MCH: 29.1 pg (ref 26.0–34.0)
MCHC: 34.1 g/dL (ref 30.0–36.0)
MCV: 85.4 fL (ref 80.0–100.0)
Platelets: 204 10*3/uL (ref 150–400)
RBC: 5.19 MIL/uL (ref 4.22–5.81)
RDW: 12.6 % (ref 11.5–15.5)
WBC: 5.9 10*3/uL (ref 4.0–10.5)
nRBC: 0 % (ref 0.0–0.2)

## 2019-03-26 LAB — BASIC METABOLIC PANEL
Anion gap: 9 (ref 5–15)
BUN: 10 mg/dL (ref 6–20)
CO2: 25 mmol/L (ref 22–32)
Calcium: 9.6 mg/dL (ref 8.9–10.3)
Chloride: 105 mmol/L (ref 98–111)
Creatinine, Ser: 1.06 mg/dL (ref 0.61–1.24)
GFR calc Af Amer: >60 mL/min (ref >60)
GFR calc non Af Amer: >60 mL/min (ref >60)
Glucose, Bld: 150 mg/dL — ABNORMAL HIGH (ref 70–99)
Potassium: 4.4 mmol/L (ref 3.5–5.1)
Sodium: 139 mmol/L (ref 135–145)

## 2019-03-26 LAB — TROPONIN I (HIGH SENSITIVITY)
Troponin I (High Sensitivity): 13 ng/L (ref <18)
Troponin I (High Sensitivity): 14 ng/L (ref <18)

## 2019-03-26 LAB — SARS CORONAVIRUS 2 (TAT 6-24 HRS): SARS Coronavirus 2: NEGATIVE

## 2019-03-26 MED ORDER — SODIUM CHLORIDE 0.9% FLUSH: 3.0000 mL | Freq: Once | INTRAVENOUS | Status: DC

## 2019-03-26 NOTE — ED Triage Notes (Signed)
Patient complains of intermittent cp x 1 week. Also complains of congestion x 2 weeks. Patient alert and oriented, NAD

## 2019-03-29 NOTE — ED Provider Notes (Signed)
[facility] EMERGENCY DEPARTMENT Provider Note   CSN: [id number] Arrival date & time: 03/26/19  0705     History   Chief Complaint No chief complaint on file.   HPI [name] is a 58 y.o. male.     HPI   58 year old male with multiple complaints.  Chest congestion cough for about 2 weeks.  Mild generalized weakness.  Intermittent sharp center chest pain for the past week.  Pain comes and goes without appreciable exacerbating relieving factors.  Last seconds to maybe a minute at the most.  Denies any other associated symptoms when he has the pain sepsis nausea, palpitations, diaphoresis or dyspnea.  Patient has known CAD and he is concerned about his chest pain with regards to this.  He states that symptoms to feeling currently feel different than symptoms he has felt in the past though.  Past Medical History:  Diagnosis Date  . Allergies   . Arthritis   . Chronic lower back pain   . Coronary artery disease    a. Multiple prior caths/PCI. Cath 2013 with possible spasm of RCA, 70% ISR of mid LCx with subsequent DES to mLCx and prox LCX. b. H/o microvascular angina. c. Recurrent angina 08/2014 - s/p PTCA/DES to prox Cx, PTCA/CBA to OM1.  c. LHC 06/10/15 with patent stents and some ISR in LCX and OM-1 that was not flow limiting --> Rx   . Dyslipidemia    a. Intolerant to many statins except tolerating Livalo.  Marland Kitchen GERD (gastroesophageal reflux disease)   . Hypertension   . Myocardial infarction ([place]) ~ 2010  . S/P angioplasty with stent, DES, to proximal and mid LCX 12/15/11 12/15/2011  . Shoulder pain   . Stroke [facility])    pt. reports had a stroke around time of MI 2010  . Type II diabetes mellitus [facility])     Patient Active Problem List   Diagnosis Date Noted  . MDD (major depressive disorder) 01/12/2019  . Chest pain 10/25/2018  . 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 Influenza A 07/17/2017  . Lactic  acidosis   . Cough   . Nausea and vomiting   . Severe episode of recurrent major depressive disorder, without psychotic features ([place])   . MDD (major depressive disorder), single episode, severe with psychotic features ([place]) 06/27/2017  . MDD (major depressive disorder), severe ([place]) 06/23/2017  . Essential hypertension 01/13/2016  . Coronary artery disease involving native coronary artery of native heart without angina pectoris 01/13/2016  . Unstable angina ([place])   . ED (erectile dysfunction) 02/15/2014  . Allergy to contrast media (used for diagnostic x-rays) 05/12/2011  . Hypertension 05/09/2011  . Hyperlipidemia 05/09/2011  . DM2 (diabetes mellitus, type 2) ([place]) 07/10/2010  . CAD S/P percutaneous coronary angioplasty 07/10/2010  . DYSPEPSIA&OTHER [facility] DISORDERS FUNCTION STOMACH 07/10/2010    Past Surgical History:  Procedure Laterality Date  . CARDIAC CATHETERIZATION  06/15/2002   LAD with prox 40% stenosis, norma L main, Cfx with 25% lesion, RCA with long mid 25% stenosis (Dr. Vita Barley)  . CARDIAC CATHETERIZATION  04/01/2010   normal L main, LAD wit mild stenosis, L Cfx with 70% in-stent restenosis, RCA with 70% in-stent restenosis, LVEF >60% (Dr. K. Mali Hilty) - cutting ballon arthrectomy to RCA & Cfx (Dr. Rockne Menghini)  . CARDIAC CATHETERIZATION  08/25/2010   preserved global LV contractility; multivessel CAD, diffuse 90-95% in-stent restenosis in prox placed Cfx stent - cutting balloon arthrectomy  in Cfx with multiple dilatations 90-95% to 0% stenosis (Dr. Corky Downs)  . CARDIAC CATHETERIZATION  01/26/2011   PCI & stenting of aggresive in-stent restenosis within previously stented AV groove Cfx with 3.0x90m Taxus DES (previous stents were Promus) (Dr. JAdora Fridge  . CARDIAC CATHETERIZATION  05/11/2011   preserved LV function, 40% mid LAD stenosis, 30-40% narrowing proximal to stented semgnet of prox Cfx, patent mid RCA stent with smooth 20% narrowing in distal RCA (Dr. TCorky Downs  .  CARDIAC CATHETERIZATION  12/15/2011   PCI & stenting of proximal & mid Cfx with DES - 3.0x111min proximal, 3.0x1529mn mid (Dr. J. Adora Fridge. CARDIAC CATHETERIZATION N/A 06/10/2015   Procedure: Left Heart Cath and Coronary Angiography;  Surgeon: DanJolaine ArtistD;  Location: [facility];  Service: Cardiovascular;  Laterality: N/A;  . CARDIAC CATHETERIZATION  10/25/2018  . cardiometabolic testing  08/14/62/4034good exercise effort, peak VO2 79% predicted with normal VO2 HR curves (mild deconditioning)  . COLONOSCOPY  12/2012   diminutive hyperplastic sigmoid poyp so repeat routine 2024  . CORONARY BALLOON ANGIOPLASTY N/A 10/25/2018   Procedure: CORONARY BALLOON ANGIOPLASTY;  Surgeon: VarJettie BoozeD;  Location: [facility];  Service: Cardiovascular;  Laterality: N/A;  . EXCISIONAL HEMORRHOIDECTOMY  1984  . LEFT HEART CATH AND CORONARY ANGIOGRAPHY N/A 10/25/2018   Procedure: LEFT HEART CATH AND CORONARY ANGIOGRAPHY;  Surgeon: VarJettie BoozeD;  Location: [facility];  Service: Cardiovascular;  Laterality: N/A;  . LEFT HEART CATHETERIZATION WITH CORONARY ANGIOGRAM N/A 05/11/2011   Procedure: LEFT HEART CATHETERIZATION WITH CORONARY ANGIOGRAM;  Surgeon: ThoTroy SineD;  [facility];  Service: Cardiovascular;  Laterality: N/A;  Possible percutaneous coronary intervention, possible IVUS  . LEFT HEART CATHETERIZATION WITH CORONARY ANGIOGRAM N/A 12/15/2011   Procedure: LEFT HEART CATHETERIZATION WITH CORONARY ANGIOGRAM;  Surgeon: JonLorretta HarpD;  [facility];  Service: Cardiovascular;  Laterality: N/A;  . LEFT HEART CATHETERIZATION WITH CORONARY ANGIOGRAM N/A 09/05/2014   Procedure: LEFT HEART CATHETERIZATION WITH CORONARY ANGIOGRAM;  Surgeon: ChrBurnell BlanksD;  [facility];  Service: Cardiovascular;  Laterality: N/A;  . LIPOMA EXCISION     back of the head  . NM MYOCAR PERF WALL MOTION  02/2012   lexiscan myoview;  mild perfusion defect in mid inferolateral & basal inferolateral region (infarct/scar); EF 52%, abnormal but ow risk scan  . PERCUTANEOUS CORONARY STENT INTERVENTION (PCI-S)  09/05/2014   Procedure: PERCUTANEOUS CORONARY STENT INTERVENTION (PCI-S);  Surgeon: ChrBurnell BlanksD;  [facility];  Service: Cardiovascular;;        Home Medications    Prior to Admission medications   Medication Sig Start Date End Date Taking? Authorizing Provider  ACCU-CHEK AVIVA PLUS test strip Use up to 3 times daily 03/01/18   [provider]  ACCU-CHEK FASTCLIX LANCETS MISC Use up to 3 times daily 03/01/18   [provider]  amLODipine (NORVASC) 10 MG tablet Take 1 tablet (10 mg total) by mouth daily. 01/17/19   FarJohnn HaiD  ARIPiprazole (ABILIFY) 2 MG tablet Take 1 tablet (2 mg total) by mouth at bedtime. 01/16/19   FarJohnn HaiD  aspirin 81 MG chewable tablet Chew 1 tablet (81 mg total) by mouth daily. 10/27/18   RobCheryln ManlyP  Blood Glucose Monitoring Suppl (ACCU-CHEK AVIVA PLUS) w/Device KIT Use up to 3 times daily 03/01/18   [provider]  co-enzyme Q-10 30 MG capsule Take 30 mg by mouth daily.    [provider]  LIVALO 4 MG TABS Take 1 tablet by mouth every morning.  09/23/18   [provider]  metFORMIN (GLUCOPHAGE-XR) 500 MG 24 hr tablet Take 1 tablet (500 mg total) by mouth daily with breakfast. For diabetes management 05/03/18   Azzie Glatter, FNP  metoprolol tartrate (LOPRESSOR) 100 MG tablet Take 1 tablet (100 mg total) by mouth 2 (two) times daily. 01/16/19   Johnn Hai, MD  mirtazapine (REMERON) 15 MG tablet Take 1 tablet (15 mg total) by mouth at bedtime. 01/16/19   Johnn Hai, MD  nitroGLYCERIN (NITROSTAT) 0.4 MG SL tablet Place 1 tablet (0.4 mg total) under the tongue every 5 (five) minutes x 3 doses as needed for chest pain. Patient not taking: Reported on 01/04/2019 10/26/18   Reino Bellis B, NP  ticagrelor  (BRILINTA) 90 MG TABS tablet Take 1 tablet (90 mg total) by mouth 2 (two) times daily. 10/26/18   Cheryln Manly, NP    Family History Family History  Problem Relation Age of Onset  . Leukemia Mother   . Prostate cancer Father   . Coronary artery disease Paternal Grandmother   . Cancer Paternal Grandfather   . Cancer Brother     Social History Social History   Tobacco Use  . Smoking status: Former Smoker    Packs/day: 1.00    Years: 10.00    Pack years: 10.00    Types: Cigarettes    Quit date: 10/07/2018    Years since quitting: 0.4  . Smokeless tobacco: Never Used  Substance Use Topics  . Alcohol use: Yes  . Drug use: No    Comment: 12/15/11 "last cocaine was 2010"     Allergies   Bee venom, Shellfish allergy, Statins, and Testosterone cypionate   Review of Systems Review of Systems  All systems reviewed and negative, other than as noted in HPI.  Physical Exam Updated Vital Signs BP (!) 145/97   Pulse (!) 50   Temp 98.4 F (36.9 C) (Oral)   Resp 13   Ht 5' 8"  (1.727 m)   Wt 88.5 kg   SpO2 96%   BMI 29.65 kg/m   Physical Exam Vitals signs and nursing note reviewed.  Constitutional:      General: He is not in acute distress.    Appearance: He is well-developed.  HENT:     Head: Normocephalic and atraumatic.  Eyes:     General:        Right eye: No discharge.        Left eye: No discharge.     Conjunctiva/sclera: Conjunctivae normal.  Neck:     Musculoskeletal: Neck supple.  Cardiovascular:     Rate and Rhythm: Normal rate and regular rhythm.     Heart sounds: Normal heart sounds. No murmur. No friction rub. No gallop.   Pulmonary:     Effort: Pulmonary effort is normal. No respiratory distress.     Breath sounds: Normal breath sounds.  Abdominal:     General: There is no distension.     Palpations: Abdomen is soft.     Tenderness: There is no abdominal tenderness.  Musculoskeletal:        General: No tenderness.     Comments: Lower  extremities symmetric as compared to each other. No calf tenderness. Negative Homan's. No palpable cords.   Skin:    General: Skin is warm and dry.  Neurological:     Mental Status: He is alert.  Psychiatric:        Behavior: Behavior normal.        Thought Content: Thought content normal.      ED Treatments / Results  Labs (all labs ordered are listed, but only abnormal results are displayed) Labs Reviewed  BASIC METABOLIC PANEL - Abnormal; Notable for the following components:      Result Value   Glucose, Bld 150 (*)    All other components within normal limits  SARS CORONAVIRUS 2 (TAT 6-24 HRS)  CBC  TROPONIN I (HIGH SENSITIVITY)  TROPONIN I (HIGH SENSITIVITY)    EKG EKG Interpretation  Date/Time:  Sunday March 26 2019 07:10:09 EDT Ventricular Rate:  68 PR Interval:  146 QRS Duration: 78 QT Interval:  364 QTC Calculation: 387 R Axis:   6 Text Interpretation:  Normal sinus rhythm Normal ECG Confirmed by Abryana Lykens ([id number]) on 03/26/2019 9:17:39 AM   Radiology No results found.   Dg Chest 2 View  Result Date: 03/26/2019 CLINICAL DATA:  Chest pain. EXAM: CHEST - 2 VIEW COMPARISON:  11/04/2018 FINDINGS: The heart size and mediastinal contours are within normal limits. Both lungs are clear. The visualized skeletal structures are unremarkable. The lungs are clear without focal pneumonia, edema, pneumothorax or pleural effusion. The cardiopericardial silhouette is within normal limits for size. The visualized bony structures of the thorax are intact. IMPRESSION: No active cardiopulmonary disease. Electronically Signed   By: Eric  Mansell M.D.   On: 03/26/2019 08:14    Procedures Procedures (including critical care time)  Medications Ordered in ED Medications - No data to display   Initial Impression / Assessment and Plan / ED Course  I have reviewed the triage vital signs and the nursing notes.  Pertinent labs & imaging results that were available during my  care of the patient were reviewed by me and considered in my medical decision making (see chart for details).        58  year old male with chest pain.  Seems very atypical for ACS.  Doubt PE, dissection or other emergent process. Final Clinical Impressions(s) / ED Diagnoses   Final diagnoses:  Chest pain, unspecified type    ED Discharge Orders    None       Virgel Manifold, MD 03/29/19 [phone]

## 2019-04-04 ENCOUNTER — Ambulatory Visit (INDEPENDENT_AMBULATORY_CARE_PROVIDER_SITE_OTHER): Payer: Medicaid Other | Admitting: Family Medicine

## 2019-04-04 ENCOUNTER — Other Ambulatory Visit: Payer: Self-pay

## 2019-04-04 ENCOUNTER — Encounter: Payer: Self-pay | Admitting: Family Medicine

## 2019-04-04 VITALS — BP 124/80 | HR 60 | Temp 97.4°F | Ht 68.0 in | Wt 195.8 lb

## 2019-04-04 DIAGNOSIS — M25531 Pain in right wrist: Secondary | ICD-10-CM

## 2019-04-04 DIAGNOSIS — M25532 Pain in left wrist: Secondary | ICD-10-CM

## 2019-04-04 DIAGNOSIS — F332 Major depressive disorder, recurrent severe without psychotic features: Secondary | ICD-10-CM

## 2019-04-04 DIAGNOSIS — F419 Anxiety disorder, unspecified: Secondary | ICD-10-CM

## 2019-04-04 DIAGNOSIS — Z9889 Other specified postprocedural states: Secondary | ICD-10-CM | POA: Diagnosis not present

## 2019-04-04 DIAGNOSIS — I10 Essential (primary) hypertension: Secondary | ICD-10-CM

## 2019-04-04 DIAGNOSIS — R002 Palpitations: Secondary | ICD-10-CM | POA: Diagnosis not present

## 2019-04-04 DIAGNOSIS — R7303 Prediabetes: Secondary | ICD-10-CM

## 2019-04-04 DIAGNOSIS — Z09 Encounter for follow-up examination after completed treatment for conditions other than malignant neoplasm: Secondary | ICD-10-CM

## 2019-04-04 DIAGNOSIS — S66919A Strain of unspecified muscle, fascia and tendon at wrist and hand level, unspecified hand, initial encounter: Secondary | ICD-10-CM

## 2019-04-04 DIAGNOSIS — Z1211 Encounter for screening for malignant neoplasm of colon: Secondary | ICD-10-CM

## 2019-04-04 DIAGNOSIS — E1159 Type 2 diabetes mellitus with other circulatory complications: Secondary | ICD-10-CM

## 2019-04-04 LAB — POCT URINALYSIS DIPSTICK
Bilirubin, UA: NEGATIVE
Blood, UA: NEGATIVE
Glucose, UA: NEGATIVE
Ketones, UA: NEGATIVE
Leukocytes, UA: NEGATIVE
Nitrite, UA: NEGATIVE
Protein, UA: NEGATIVE
Spec Grav, UA: >=1.03 — AB (ref 1.010–1.025)
Urobilinogen, UA: 0.2 E.U./dL
pH, UA: 5.5 (ref 5.0–8.0)

## 2019-04-04 MED ORDER — ARIPIPRAZOLE 2 MG PO TABS: 2.0000 mg | ORAL_TABLET | Freq: Every day | ORAL | 3 refills | Status: DC

## 2019-04-04 MED ORDER — AMLODIPINE BESYLATE 10 MG PO TABS: 10.0000 mg | ORAL_TABLET | Freq: Every day | ORAL | 1 refills | Status: DC

## 2019-04-04 MED ORDER — TICAGRELOR 90 MG PO TABS: 90.0000 mg | ORAL_TABLET | Freq: Two times a day (BID) | ORAL | 1 refills | Status: DC

## 2019-04-04 MED ORDER — ISOSORBIDE MONONITRATE ER 60 MG PO TB24: 60.0000 mg | ORAL_TABLET | Freq: Every day | ORAL | 3 refills | Status: DC

## 2019-04-04 MED ORDER — LIVALO 4 MG PO TABS: 1.0000 | ORAL_TABLET | Freq: Every morning | ORAL | 3 refills | Status: DC

## 2019-04-04 MED ORDER — IBUPROFEN-FAMOTIDINE 800-26.6 MG PO TABS: 800.0000 mg | ORAL_TABLET | Freq: Three times a day (TID) | ORAL | 3 refills | Status: DC | PRN

## 2019-04-04 MED ORDER — METFORMIN HCL 500 MG PO TABS: 500.0000 mg | ORAL_TABLET | Freq: Two times a day (BID) | ORAL | 1 refills | Status: DC

## 2019-04-04 MED ORDER — MIRTAZAPINE 15 MG PO TABS: 15.0000 mg | ORAL_TABLET | Freq: Every day | ORAL | 3 refills | Status: DC

## 2019-04-04 MED ORDER — METOPROLOL TARTRATE 100 MG PO TABS: 100.0000 mg | ORAL_TABLET | Freq: Two times a day (BID) | ORAL | 3 refills | Status: DC

## 2019-04-04 NOTE — Patient Instructions (Addendum)
Famotidine; Ibuprofen oral tablets What is this medicine? FAMOTIDINE; IBUPROFEN (fa MOE ti deen; eye BYOO proe fen) is two medicines together. Ibuprofen is a non-steroidal anti-inflammatory drug (NSAID). It is used for arthritis pain. Famotidine is a type of antihistamine that blocks the release of stomach acid. It is used to prevent stomach problems from the ibuprofen. This medicine may be used for other purposes; ask your health care provider or pharmacist if you have questions. COMMON BRAND NAME(S): DUEXIS What should I tell my health care provider before I take this medicine? They need to know if you have any of these conditions:  asthma  cigarette smoker  drink more than 3 alcohol containing drinks a day  heart disease  high blood pressure  history of stomach bleeding  kidney disease  liver disease  stomach or intestine problems  an unusual or allergic reaction to famotidine, ibuprofen, other medicines, foods, dyes, or preservatives  pregnant or trying to get pregnant  breast-feeding How should I use this medicine? Take this medicine by mouth with a glass of water. Follow the directions on the prescription label. Do not cut, crush or chew this medicine. You can take it with or without food. If it upsets your stomach, take it with food. Take your medicine at regular intervals. Do not take it more often than directed. Do not stop taking except on your doctor's advice. A special MedGuide will be given to you by the pharmacist with each prescription and refill. Be sure to read this information carefully each time. Talk to your pediatrician regarding the use of this medicine in children. Special care may be needed. Overdosage: If you think you have taken too much of this medicine contact a poison control center or emergency room at once. NOTE: This medicine is only for you. Do not share this medicine with others. What if I miss a dose? If you miss a dose, take it as soon as  you can. If it is almost time for your next dose, take only that dose. Do not take double or extra doses. What may interact with this medicine? Do not take this medicine with any of the following medications:  cidofovir  ketorolac  methotrexate This medicine may also interact with the following medications:  alcohol  aspirin  certain medicines for blood pressure, heart disease, irregular heart beat  certain medicines for depression, anxiety, or psychotic disturbances  cholestyramine  diuretics  lithium  steroid medicines like prednisone or cortisone  supplements like feverfew, flavocoxid, garlic, ginger, ginkgo, and methylsulfonylmethane, MSM  warfarin This list may not describe all possible interactions. Give your health care provider a list of all the medicines, herbs, non-prescription drugs, or dietary supplements you use. Also tell them if you smoke, drink alcohol, or use illegal drugs. Some items may interact with your medicine. What should I watch for while using this medicine? Tell your doctor or healthcare provider if your symptoms do not start to get better or if they get worse. This medicine may cause serious skin reactions. They can happen weeks to months after starting the medicine. Contact your healthcare provider right away if you notice fevers or flu-like symptoms with a rash. The rash may be red or purple and then turn into blisters or peeling of the skin. Or, you might notice a red rash with swelling of the face, lips or lymph nodes in your neck or under your arms. Do not take other medicines that contain aspirin, ibuprofen, or naproxen with this medicine. Side  effects such as stomach upset, nausea, or ulcers may be more likely to occur. Many medicines available without a prescription should not be taken with this medicine. This medicine can cause ulcers and bleeding in the stomach and intestines at any time during treatment. This can happen with no warning and may  cause death. There is increased risk with taking this medicine for a long time. Smoking, drinking alcohol, older age, and poor health can also increase risks. Call your doctor right away if you have stomach pain or blood in your vomit or stool. This medicine does not prevent heart attack or stroke. In fact, this medicine may increase the chance of a heart attack or stroke. The chance may increase with longer use of this medicine and in people who have heart disease. If you take aspirin to prevent heart attack or stroke, talk with your doctor or healthcare provider. This medicine may increase your risk to bruise or bleed. Call your doctor or healthcare provider if you notice any unusual bleeding. This medicine may cause a decrease in vitamin B12. You should make sure that you get enough vitamin B12 while you are taking this medicine. Discuss the foods you eat and the vitamins you take with your healthcare provider. What side effects may I notice from receiving this medicine? Side effects that you should report to your doctor or health care professional as soon as possible:  allergic reactions like skin rash, itching or hives, swelling of the face, lips, or tongue  redness, blistering, peeling, or loosening of the skin, including inside the mouth  severe stomach pain  signs and symptoms of bleeding such as bloody or black, tarry stools; red or dark-brown urine; spitting up blood or brown material that looks like coffee grounds; red spots on the skin; unusual bruising or bleeding from the eye, gums, or nose  signs and symptoms of a blood clot such as changes in vision; chest pain; severe, sudden headache; trouble speaking; sudden numbness or weakness of the face, arm, or leg  unexplained weight gain or swelling  unusually weak or tired  yellowing of the eyes or skin Side effects that usually do not require medical attention (report to your doctor or health care professional if they continue or are  bothersome):  constipation  diarrhea  dizziness, drowsiness  headache  nausea, vomiting This list may not describe all possible side effects. Call your doctor for medical advice about side effects. You may report side effects to FDA at 1-800-FDA-1088. Where should I keep my medicine? Keep out of the reach of children. Store at room temperature between 15 and 30 degrees C (59 and 86 degrees F). Throw away any unused medicine after the expiration date. NOTE: This sheet is a summary. It may not cover all possible information. If you have questions about this medicine, talk to your doctor, pharmacist, or health care provider.  2020 Elsevier/Gold Standard (2018-08-31 14:34:53) Muscle Strain A muscle strain is an injury that happens when a muscle is stretched longer than normal. This can happen during a fall, sports, or lifting. This can tear some muscle fibers. Usually, recovery from muscle strain takes 1-2 weeks. Complete healing normally takes 5-6 weeks. This condition is first treated with PRICE therapy. This involves:  Protecting your muscle from being injured again.  Resting your injured muscle.  Icing your injured muscle.  Applying pressure (compression) to your injured muscle. This may be done with a splint or elastic bandage.  Raising (elevating) your injured muscle. Your  doctor may also recommend medicine for pain. Follow these instructions at home: If you have a splint:  Wear the splint as told by your doctor. Take it off only as told by your doctor.  Loosen the splint if your fingers or toes tingle, get numb, or turn cold and blue.  Keep the splint clean.  If the splint is not waterproof: ? Do not let it get wet. ? Cover it with a watertight covering when you take a bath or a shower. Managing pain, stiffness, and swelling   If directed, put ice on your injured area. ? If you have a removable splint, take it off as told by your doctor. ? Put ice in a plastic bag.  ? Place a towel between your skin and the bag. ? Leave the ice on for 20 minutes, 2-3 times a day.  Move your fingers or toes often. This helps to avoid stiffness and lessen swelling.  Raise your injured area above the level of your heart while you are sitting or lying down.  Wear an elastic bandage as told by your doctor. Make sure it is not too tight. General instructions  Take over-the-counter and prescription medicines only as told by your doctor.  Limit your activity. Rest your injured muscle as told by your doctor. Your doctor may say that gentle movements are okay.  If physical therapy was prescribed, do exercises as told by your doctor.  Do not put pressure on any part of the splint until it is fully hardened. This may take many hours.  Do not use any products that contain nicotine or tobacco, such as cigarettes and e-cigarettes. These can delay bone healing. If you need help quitting, ask your doctor.  Warm up before you exercise. This helps to prevent more muscle strains.  Ask your doctor when it is safe to drive if you have a splint.  Keep all follow-up visits as told by your doctor. This is important. Contact a doctor if:  You have more pain or swelling in your injured area. Get help right away if:  You have any of these problems in your injured area: ? You have numbness. ? You have tingling. ? You lose a lot of strength. Summary  A muscle strain is an injury that happens when a muscle is stretched longer than normal.  This condition is first treated with PRICE therapy. This includes protecting, resting, icing, adding pressure, and raising your injury.  Limit your activity. Rest your injured muscle as told by your doctor. Your doctor may say that gentle movements are okay.  Warm up before you exercise. This helps to prevent more muscle strains. This information is not intended to replace advice given to you by your health care provider. Make sure you discuss  any questions you have with your health care provider. Document Released: 03/24/2008 Document Revised: 08/11/2018 Document Reviewed: 07/22/2016 Elsevier Patient Education  2020 [employer] DASH stands for "Dietary Approaches to Stop Hypertension." The DASH eating plan is a healthy eating plan that has been shown to reduce high blood pressure (hypertension). It may also reduce your risk for type 2 diabetes, heart disease, and stroke. The DASH eating plan may also help with weight loss. What are tips for following this plan?  General guidelines  Avoid eating more than 2,300 mg (milligrams) of salt (sodium) a day. If you have hypertension, you may need to reduce your sodium intake to 1,500 mg a day.  Limit alcohol intake to  no more than 1 drink a day for nonpregnant women and 2 drinks a day for men. One drink equals 12 oz of beer, 5 oz of wine, or 1 oz of hard liquor.  Work with your health care provider to maintain a healthy body weight or to lose weight. Ask what an ideal weight is for you.  Get at least 30 minutes of exercise that causes your heart to beat faster (aerobic exercise) most days of the week. Activities may include walking, swimming, or biking.  Work with your health care provider or diet and nutrition specialist (dietitian) to adjust your eating plan to your individual calorie needs. Reading food labels   Check food labels for the amount of sodium per serving. Choose foods with less than 5 percent of the Daily Value of sodium. Generally, foods with less than 300 mg of sodium per serving fit into this eating plan.  To find whole grains, look for the word "whole" as the first word in the ingredient list. Shopping  Buy products labeled as "low-sodium" or "no salt added."  Buy fresh foods. Avoid canned foods and premade or frozen meals. Cooking  Avoid adding salt when cooking. Use salt-free seasonings or herbs instead of table salt or sea salt. Check with  your health care provider or pharmacist before using salt substitutes.  Do not fry foods. Cook foods using healthy methods such as baking, boiling, grilling, and broiling instead.  Cook with heart-healthy oils, such as olive, canola, soybean, or sunflower oil. Meal planning  Eat a balanced diet that includes: ? 5 or more servings of fruits and vegetables each day. At each meal, try to fill half of your plate with fruits and vegetables. ? Up to 6-8 servings of whole grains each day. ? Less than 6 oz of lean meat, poultry, or fish each day. A 3-oz serving of meat is about the same size as a deck of cards. One egg equals 1 oz. ? 2 servings of low-fat dairy each day. ? A serving of nuts, seeds, or beans 5 times each week. ? Heart-healthy fats. Healthy fats called Omega-3 fatty acids are found in foods such as flaxseeds and coldwater fish, like sardines, salmon, and mackerel.  Limit how much you eat of the following: ? Canned or prepackaged foods. ? Food that is high in trans fat, such as fried foods. ? Food that is high in saturated fat, such as fatty meat. ? Sweets, desserts, sugary drinks, and other foods with added sugar. ? Full-fat dairy products.  Do not salt foods before eating.  Try to eat at least 2 vegetarian meals each week.  Eat more home-cooked food and less restaurant, buffet, and fast food.  When eating at a restaurant, ask that your food be prepared with less salt or no salt, if possible. What foods are recommended? The items listed may not be a complete list. Talk with your dietitian about what dietary choices are best for you. Grains Whole-grain or whole-wheat bread. Whole-grain or whole-wheat pasta. Brown rice. [place]. Bulgur. Whole-grain and low-sodium cereals. Pita bread. Low-fat, low-sodium crackers. Whole-wheat flour tortillas. Vegetables Fresh or frozen vegetables (raw, steamed, roasted, or grilled). Low-sodium or reduced-sodium tomato and vegetable  juice. Low-sodium or reduced-sodium tomato sauce and tomato paste. Low-sodium or reduced-sodium canned vegetables. Fruits All fresh, dried, or frozen fruit. Canned fruit in natural juice (without added sugar). Meat and other protein foods Skinless chicken or [place]. Ground chicken or [place]. Pork with fat trimmed off. Fish and  seafood. Egg whites. Dried beans, peas, or lentils. Unsalted nuts, nut butters, and seeds. Unsalted canned beans. Lean cuts of beef with fat trimmed off. Low-sodium, lean deli meat. Dairy Low-fat (1%) or fat-free (skim) milk. Fat-free, low-fat, or reduced-fat cheeses. Nonfat, low-sodium ricotta or cottage cheese. Low-fat or nonfat yogurt. Low-fat, low-sodium cheese. Fats and oils Soft margarine without trans fats. Vegetable oil. Low-fat, reduced-fat, or light mayonnaise and salad dressings (reduced-sodium). Canola, safflower, olive, soybean, and sunflower oils. Avocado. Seasoning and other foods Herbs. Spices. Seasoning mixes without salt. Unsalted popcorn and pretzels. Fat-free sweets. What foods are not recommended? The items listed may not be a complete list. Talk with your dietitian about what dietary choices are best for you. Grains Baked goods made with fat, such as croissants, muffins, or some breads. Dry pasta or rice meal packs. Vegetables Creamed or fried vegetables. Vegetables in a cheese sauce. Regular canned vegetables (not low-sodium or reduced-sodium). Regular canned tomato sauce and paste (not low-sodium or reduced-sodium). Regular tomato and vegetable juice (not low-sodium or reduced-sodium). Angie Fava. Olives. Fruits Canned fruit in a light or heavy syrup. Fried fruit. Fruit in cream or butter sauce. Meat and other protein foods Fatty cuts of meat. Ribs. Fried meat. Berniece Salines. Sausage. Bologna and other processed lunch meats. Salami. Fatback. Hotdogs. Bratwurst. Salted nuts and seeds. Canned beans with added salt. Canned or smoked fish. Whole eggs or egg yolks.  Chicken or [place] with skin. Dairy Whole or 2% milk, cream, and half-and-half. Whole or full-fat cream cheese. Whole-fat or sweetened yogurt. Full-fat cheese. Nondairy creamers. Whipped toppings. Processed cheese and cheese spreads. Fats and oils Butter. Stick margarine. Lard. Shortening. Ghee. Bacon fat. Tropical oils, such as coconut, palm kernel, or palm oil. Seasoning and other foods Salted popcorn and pretzels. Onion salt, garlic salt, seasoned salt, table salt, and sea salt. Worcestershire sauce. Tartar sauce. Barbecue sauce. Teriyaki sauce. Soy sauce, including reduced-sodium. Steak sauce. Canned and packaged gravies. Fish sauce. Oyster sauce. Cocktail sauce. Horseradish that you find on the shelf. Ketchup. Mustard. Meat flavorings and tenderizers. Bouillon cubes. Hot sauce and Tabasco sauce. Premade or packaged marinades. Premade or packaged taco seasonings. Relishes. Regular salad dressings. Where to find more information:  National Heart, Lung, and [place]: https://wilson-eaton.com/  American Heart Association: www.heart.org Summary  The DASH eating plan is a healthy eating plan that has been shown to reduce high blood pressure (hypertension). It may also reduce your risk for type 2 diabetes, heart disease, and stroke.  With the DASH eating plan, you should limit salt (sodium) intake to 2,300 mg a day. If you have hypertension, you may need to reduce your sodium intake to 1,500 mg a day.  When on the DASH eating plan, aim to eat more fresh fruits and vegetables, whole grains, lean proteins, low-fat dairy, and heart-healthy fats.  Work with your health care provider or diet and nutrition specialist (dietitian) to adjust your eating plan to your individual calorie needs. This information is not intended to replace advice given to you by your health care provider. Make sure you discuss any questions you have with your health care provider. Document Released: 06/04/2011 Document Revised:  05/28/2017 Document Reviewed: 06/08/2016 Elsevier Patient Education  2020 [employer].

## 2019-04-04 NOTE — Progress Notes (Signed)
Patient [name]   Established Patient Office Visit  Subjective:  Patient ID: [name], male    DOB: [birth year]  Age: 58 y.o. MRN: [id number]  CC:  Chief Complaint  Patient presents with  . Follow-up    HTN & DM    HPI [name] is a 58 year old male who presents for Hospital Follow Up today.  Past Medical History:  Diagnosis Date  . Allergies   . Arthritis   . Chest pain   . Chronic lower back pain   . Coronary artery disease    a. Multiple prior caths/PCI. Cath 2013 with possible spasm of RCA, 70% ISR of mid LCx with subsequent DES to mLCx and prox LCX. b. H/o microvascular angina. c. Recurrent angina 08/2014 - s/p PTCA/DES to prox Cx, PTCA/CBA to OM1.  c. LHC 06/10/15 with patent stents and some ISR in LCX and OM-1 that was not flow limiting --> Rx   . Dyslipidemia    a. Intolerant to many statins except tolerating Livalo.  Marland Kitchen GERD (gastroesophageal reflux disease)   . H/O cardiac catheterization 10/25/2018  . Hypertension   . Myocardial infarction ([name]) ~ 2010  . S/P angioplasty with stent, DES, to proximal and mid LCX 12/15/11 12/15/2011  . Shoulder pain   . Stroke [facility])    pt. reports had a stroke around time of MI 2010  . Type II diabetes mellitus ([name])   . Unstable angina [facility])    Current Status: Since his last office visit, he has had several ED visits on 03/26/2019, 11/04/2018, and he has also had a Hospital Admission on 10/25/2018 in which he stayed overnight for Chest Pain. He also underwent Left Cardiac Catherization on 10/25/2018, has was not able to complete Cardiac Reheb. He has had frequent follow ups with Cardiologist. He states that he continues to have mild angina on exertion. He is schedule to see Cardiologist on 04/2019. Today is doing well with no complaints. He denies visual changes, chest pain, cough, shortness of breath, heart palpitations, and falls. He has occasional headaches and dizziness with position  changes. Denies severe headaches, confusion, seizures, double vision, and blurred vision, nausea and vomiting. He has also had an admission to [facility] from 01/12/2019-01/16/2019 for Major Depressive Disorder. His anxiety is stable today. He denies suicidal ideations, homicidal ideations, or auditory hallucinations. He continues to follow up with Psychiatry as needed. His last appointment was 04/04/2019. He is currently attending AA meetings regularly via [place]. He has c/o bilateral wrist pain, which he states that he has been using his hands more frequently lately. He has not used any medication to alleviate pain.  He denies fevers, chills, fatigue, recent infections, weight loss, and night sweats. No reports of GI problems such as nausea, vomiting, diarrhea, and constipation. He has no reports of blood in stools, dysuria and hematuria. He denies pain today.   Past Surgical History:  Procedure Laterality Date  . CARDIAC CATHETERIZATION  06/15/2002   LAD with prox 40% stenosis, norma L main, Cfx with 25% lesion, RCA with long mid 25% stenosis (Dr. Vita Barley)  . CARDIAC CATHETERIZATION  04/01/2010   normal L main, LAD wit mild stenosis, L Cfx with 70% in-stent restenosis, RCA with 70% in-stent restenosis, LVEF >60% (Dr. K. Mali Hilty) - cutting ballon arthrectomy to RCA & Cfx (Dr. Rockne Menghini)  . CARDIAC CATHETERIZATION  08/25/2010   preserved global LV contractility; multivessel CAD, diffuse 90-95% in-stent  restenosis in prox placed Cfx stent - cutting balloon arthrectomy in Cfx with multiple dilatations 90-95% to 0% stenosis (Dr. Corky Downs)  . CARDIAC CATHETERIZATION  01/26/2011   PCI & stenting of aggresive in-stent restenosis within previously stented AV groove Cfx with 3.0x100m Taxus DES (previous stents were Promus) (Dr. JAdora Fridge  . CARDIAC CATHETERIZATION  05/11/2011   preserved LV function, 40% mid LAD stenosis, 30-40% narrowing proximal to stented semgnet of prox Cfx, patent mid RCA stent  with smooth 20% narrowing in distal RCA (Dr. TCorky Downs  . CARDIAC CATHETERIZATION  12/15/2011   PCI & stenting of proximal & mid Cfx with DES - 3.0x134min proximal, 3.0x1564mn mid (Dr. J. Adora Fridge. CARDIAC CATHETERIZATION N/A 06/10/2015   Procedure: Left Heart Cath and Coronary Angiography;  Surgeon: DanJolaine ArtistD;  Location: [facility];  Service: Cardiovascular;  Laterality: N/A;  . CARDIAC CATHETERIZATION  10/25/2018  . cardiometabolic testing  08/10/33/5732good exercise effort, peak VO2 79% predicted with normal VO2 HR curves (mild deconditioning)  . COLONOSCOPY  12/2012   diminutive hyperplastic sigmoid poyp so repeat routine 2024  . CORONARY BALLOON ANGIOPLASTY N/A 10/25/2018   Procedure: CORONARY BALLOON ANGIOPLASTY;  Surgeon: VarJettie BoozeD;  Location: [facility];  Service: Cardiovascular;  Laterality: N/A;  . EXCISIONAL HEMORRHOIDECTOMY  1984  . LEFT HEART CATH AND CORONARY ANGIOGRAPHY N/A 10/25/2018   Procedure: LEFT HEART CATH AND CORONARY ANGIOGRAPHY;  Surgeon: VarJettie BoozeD;  Location: [facility];  Service: Cardiovascular;  Laterality: N/A;  . LEFT HEART CATHETERIZATION WITH CORONARY ANGIOGRAM N/A 05/11/2011   Procedure: LEFT HEART CATHETERIZATION WITH CORONARY ANGIOGRAM;  Surgeon: ThoTroy SineD;  [facility];  Service: Cardiovascular;  Laterality: N/A;  Possible percutaneous coronary intervention, possible IVUS  . LEFT HEART CATHETERIZATION WITH CORONARY ANGIOGRAM N/A 12/15/2011   Procedure: LEFT HEART CATHETERIZATION WITH CORONARY ANGIOGRAM;  Surgeon: JonLorretta HarpD;  [facility];  Service: Cardiovascular;  Laterality: N/A;  . LEFT HEART CATHETERIZATION WITH CORONARY ANGIOGRAM N/A 09/05/2014   Procedure: LEFT HEART CATHETERIZATION WITH CORONARY ANGIOGRAM;  Surgeon: ChrBurnell BlanksD;  [facility];  Service: Cardiovascular;  Laterality: N/A;  . LIPOMA EXCISION     back of the head  .  NM MYOCAR PERF WALL MOTION  02/2012   lexiscan myoview; mild perfusion defect in mid inferolateral & basal inferolateral region (infarct/scar); EF 52%, abnormal but ow risk scan  . PERCUTANEOUS CORONARY STENT INTERVENTION (PCI-S)  09/05/2014   Procedure: PERCUTANEOUS CORONARY STENT INTERVENTION (PCI-S);  Surgeon: ChrBurnell BlanksD;  [facility];  Service: Cardiovascular;;    Family History  Problem Relation Age of Onset  . Leukemia Mother   . Prostate cancer Father   . Coronary artery disease Paternal Grandmother   . Cancer Paternal Grandfather   . Cancer Brother     Social History   Socioeconomic History  . Marital status: Divorced    Spouse name: Not on file  . Number of children: 2  . Years of education: GED  . Highest education level: Not on file  Occupational History  . Not on file  Social Needs  . Financial resource strain: Not on file  . Food insecurity    Worry: Not on file    Inability: Not on file  . Transportation needs    Medical: Not on file    Non-medical: Not on file  Tobacco Use  . Smoking status: Former Smoker    Packs/day: 1.00    Years: 10.00    Pack years: 10.00    Types: Cigarettes    Quit date: 10/07/2018    Years since quitting: 0.4  . Smokeless tobacco: Never Used  Substance and Sexual Activity  . Alcohol use: Yes  . Drug use: No    Comment: 12/15/11 "last cocaine was 2010"  . Sexual activity: Not Currently  Lifestyle  . Physical activity    Days per week: Not on file    Minutes per session: Not on file  . Stress: Not on file  Relationships  . Social Herbalist on phone: Not on file    Gets together: Not on file    Attends religious service: Not on file    Active member of club or organization: Not on file    Attends meetings of clubs or organizations: Not on file    Relationship status: Not on file  . Intimate partner violence    Fear of current or ex partner: Not on file    Emotionally abused: Not on file     Physically abused: Not on file    Forced sexual activity: Not on file  Other Topics Concern  . Not on file  Social History Narrative  . Not on file    Outpatient Medications Prior to Visit  Medication Sig Dispense Refill  . ACCU-CHEK AVIVA PLUS test strip Use up to 3 times daily  11  . ACCU-CHEK FASTCLIX LANCETS MISC Use up to 3 times daily  11  . aspirin 81 MG chewable tablet Chew 1 tablet (81 mg total) by mouth daily. 30 tablet 3  . Blood Glucose Monitoring Suppl (ACCU-CHEK AVIVA PLUS) w/Device KIT Use up to 3 times daily  0  . co-enzyme Q-10 30 MG capsule Take 30 mg by mouth daily.    . metFORMIN (GLUCOPHAGE-XR) 500 MG 24 hr tablet Take 1 tablet (500 mg total) by mouth daily with breakfast. For diabetes management 90 tablet 1  . nitroGLYCERIN (NITROSTAT) 0.4 MG SL tablet Place 1 tablet (0.4 mg total) under the tongue every 5 (five) minutes x 3 doses as needed for chest pain. (Patient not taking: Reported on 01/04/2019) 25 tablet 2  . amLODipine (NORVASC) 10 MG tablet Take 1 tablet (10 mg total) by mouth daily. 90 tablet 1  . ARIPiprazole (ABILIFY) 2 MG tablet Take 1 tablet (2 mg total) by mouth at bedtime. 30 tablet 3  . isosorbide mononitrate (IMDUR) 60 MG 24 hr tablet Take 60 mg by mouth daily.    Marland Kitchen LIVALO 4 MG TABS Take 1 tablet by mouth every morning.     . metoprolol tartrate (LOPRESSOR) 100 MG tablet Take 1 tablet (100 mg total) by mouth 2 (two) times daily. 60 tablet 2  . mirtazapine (REMERON) 15 MG tablet Take 1 tablet (15 mg total) by mouth at bedtime. 30 tablet 3  . ticagrelor (BRILINTA) 90 MG TABS tablet Take 1 tablet (90 mg total) by mouth 2 (two) times daily. 180 tablet 2   No facility-administered medications prior to visit.     Allergies  Allergen Reactions  . Bee Venom Anaphylaxis and Hives  . Shellfish Allergy Anaphylaxis and Hives  . Statins Other (See Comments)    Myalgias. Tolerating livalo.   . Testosterone Cypionate     Testerone Injection --Increased  breast tissue     ROS Review of Systems  Constitutional: Negative.  HENT: Negative.   Eyes: Negative.   Respiratory: Negative.   Cardiovascular: Positive for palpitations (occasional ).  Gastrointestinal: Negative.   Endocrine: Negative.   Genitourinary: Negative.   Musculoskeletal: Positive for arthralgias (generalized joint pain).  Skin: Negative.   Allergic/Immunologic: Negative.   Neurological: Positive for dizziness (occasional ) and headaches (occasional ).  Hematological: Negative.   Psychiatric/Behavioral: Negative.       Objective:    Physical Exam  Constitutional: He is oriented to person, place, and time. He appears well-developed and well-nourished.  HENT:  Head: Normocephalic and atraumatic.  Eyes: Conjunctivae are normal.  Neck: Normal range of motion. Neck supple.  Cardiovascular: Normal rate, regular rhythm, normal heart sounds and intact distal pulses.  Pulmonary/Chest: Effort normal and breath sounds normal.  Abdominal: Soft. Bowel sounds are normal.  Musculoskeletal: Normal range of motion.  Neurological: He is alert and oriented to person, place, and time. He has normal reflexes.  Skin: Skin is warm and dry.  Psychiatric: He has a normal mood and affect. His behavior is normal. Judgment and thought content normal.  Nursing note and vitals reviewed.   BP 124/80 (BP Location: Left Arm, Patient Position: Sitting, Cuff Size: Large)   Pulse 60   Temp (!) 97.4 F (36.3 C) (Oral)   Ht 5' 8"  (1.727 m)   Wt 195 lb 12.8 oz (88.8 kg)   SpO2 100%   BMI 29.77 kg/m  Wt Readings from Last 3 Encounters:  04/04/19 195 lb 12.8 oz (88.8 kg)  03/26/19 195 lb (88.5 kg)  11/15/18 191 lb 6.4 oz (86.8 kg)     Health Maintenance Due  Topic Date Due  . URINE MICROALBUMIN  10/28/1971  . INFLUENZA VACCINE  01/28/2019  . FOOT EXAM  03/03/2019    There are no preventive care reminders to display for this patient.  Lab Results  Component Value Date   TSH 1.210  01/13/2019   Lab Results  Component Value Date   WBC 5.9 03/26/2019   HGB 15.1 03/26/2019   HCT 44.3 03/26/2019   MCV 85.4 03/26/2019   PLT 204 03/26/2019   Lab Results  Component Value Date   NA 139 03/26/2019   K 4.4 03/26/2019   CO2 25 03/26/2019   GLUCOSE 150 (H) 03/26/2019   BUN 10 03/26/2019   CREATININE 1.06 03/26/2019   BILITOT 0.3 01/13/2019   ALKPHOS 57 01/13/2019   AST 22 01/13/2019   ALT 44 01/13/2019   PROT 6.7 01/13/2019   ALBUMIN 4.2 01/13/2019   CALCIUM 9.6 03/26/2019   ANIONGAP 9 03/26/2019   Lab Results  Component Value Date   CHOL 143 10/26/2018   Lab Results  Component Value Date   HDL 41 10/26/2018   Lab Results  Component Value Date   LDLCALC 77 10/26/2018   Lab Results  Component Value Date   TRIG 125 10/26/2018   Lab Results  Component Value Date   CHOLHDL 3.5 10/26/2018   Lab Results  Component Value Date   HGBA1C 6.6 (H) 10/25/2018      Assessment & Plan:   1. Hospital discharge follow-up  2. History of cardiac catheterization Stable. No signs or symptoms of recurrence of noted or reported today.   3. Essential hypertension The current medical regimen is effective; blood pressure is stable at 124/80 today; continue present plan and medications as prescribed. He will continue to take medications as prescribed, to decrease high sodium intake, excessive alcohol intake, increase potassium intake, smoking cessation, and increase  physical activity of at least 30 minutes of cardio activity daily. He will continue to follow Heart Healthy or DASH diet. - POCT urinalysis dipstick - isosorbide mononitrate (IMDUR) 60 MG 24 hr tablet; Take 1 tablet (60 mg total) by mouth daily.  Dispense: 30 tablet; Refill: 3 - metoprolol tartrate (LOPRESSOR) 100 MG tablet; Take 1 tablet (100 mg total) by mouth 2 (two) times daily.  Dispense: 60 tablet; Refill: 3 - ticagrelor (BRILINTA) 90 MG TABS tablet; Take 1 tablet (90 mg total) by mouth 2 (two) times  daily.  Dispense: 180 tablet; Refill: 1 - amLODipine (NORVASC) 10 MG tablet; Take 1 tablet (10 mg total) by mouth daily.  Dispense: 90 tablet; Refill: 1  4. Heart palpitations Stable today. We will continue to monitor.  5. Severe episode of recurrent major depressive disorder, without psychotic features ([place]) Continue to follow up with Psychiatry as needed.   6. Muscle strain of wrist, unspecified laterality, initial encounter Bilateral wrists.  7. Pain in both wrists We will initiate pain medication. He received written material on RICE using RICE methods in the aide in pain relief.  - Ibuprofen-Famotidine 800-26.6 MG TABS; Take 800 mg by mouth every 8 (eight) hours as needed.  Dispense: 90 tablet; Refill: 3  8. Anxiety He will continue to follow up with Psychiatry as needed. He will also continue to attend AA meetings regularly.  - ARIPiprazole (ABILIFY) 2 MG tablet; Take 1 tablet (2 mg total) by mouth at bedtime.  Dispense: 30 tablet; Refill: 3 - mirtazapine (REMERON) 15 MG tablet; Take 1 tablet (15 mg total) by mouth at bedtime.  Dispense: 30 tablet; Refill: 3  9. Prediabetes The current medical regimen is effective; Hgb A1c is stable at 6.6 today; continue present plan and medications as prescribed. He will continue medication as prescribed, to decrease foods/beverages high in sugars and carbs and follow Heart Healthy or DASH diet. Increase physical activity to at least 30 minutes cardio exercise daily.   10. Type 2 diabetes mellitus with other circulatory complication, without long-term current use of insulin ([name]) - metFORMIN (GLUCOPHAGE) 500 MG tablet; Take 1 tablet (500 mg total) by mouth 2 (two) times daily with a meal.  Dispense: 180 tablet; Refill: 1  11. Screening for colon cancer We will re-assess at next office visit.  12. Follow up He will follow up in 3 months.   Meds ordered this encounter  Medications  . Ibuprofen-Famotidine 800-26.6 MG TABS    Sig: Take 800 mg  by mouth every 8 (eight) hours as needed.    Dispense:  90 tablet    Refill:  3  . DISCONTD: amLODipine (NORVASC) 10 MG tablet    Sig: Take 1 tablet (10 mg total) by mouth daily.    Dispense:  90 tablet    Refill:  1  . ARIPiprazole (ABILIFY) 2 MG tablet    Sig: Take 1 tablet (2 mg total) by mouth at bedtime.    Dispense:  30 tablet    Refill:  3  . isosorbide mononitrate (IMDUR) 60 MG 24 hr tablet    Sig: Take 1 tablet (60 mg total) by mouth daily.    Dispense:  30 tablet    Refill:  3  . LIVALO 4 MG TABS    Sig: Take 1 tablet (4 mg total) by mouth every morning.    Dispense:  30 tablet    Refill:  3  . metoprolol tartrate (LOPRESSOR) 100 MG tablet    Sig: Take 1  tablet (100 mg total) by mouth 2 (two) times daily.    Dispense:  60 tablet    Refill:  3  . mirtazapine (REMERON) 15 MG tablet    Sig: Take 1 tablet (15 mg total) by mouth at bedtime.    Dispense:  30 tablet    Refill:  3  . ticagrelor (BRILINTA) 90 MG TABS tablet    Sig: Take 1 tablet (90 mg total) by mouth 2 (two) times daily.    Dispense:  180 tablet    Refill:  1  . metFORMIN (GLUCOPHAGE) 500 MG tablet    Sig: Take 1 tablet (500 mg total) by mouth 2 (two) times daily with a meal.    Dispense:  180 tablet    Refill:  1  . amLODipine (NORVASC) 10 MG tablet    Sig: Take 1 tablet (10 mg total) by mouth daily.    Dispense:  90 tablet    Refill:  1    Orders Placed This Encounter  Procedures  . POCT urinalysis dipstick    Referral Orders  No referral(s) requested today    Kathe Becton,  MSN, FNP-BC [address], [place] [address] [phone] fax    Problem List Items Addressed This Visit      Cardiovascular and Mediastinum   Essential hypertension   Relevant Medications   isosorbide mononitrate (IMDUR) 60 MG 24 hr tablet   LIVALO 4 MG TABS   metoprolol tartrate (LOPRESSOR) 100 MG tablet    ticagrelor (BRILINTA) 90 MG TABS tablet   amLODipine (NORVASC) 10 MG tablet   Other Relevant Orders   POCT urinalysis dipstick (Completed)     Endocrine   DM2 (diabetes mellitus, type 2) ([name]) (Chronic)   Relevant Medications   LIVALO 4 MG TABS   metFORMIN (GLUCOPHAGE) 500 MG tablet     Other   MDD (major depressive disorder)   Relevant Medications   mirtazapine (REMERON) 15 MG tablet    Other Visit Diagnoses    Hospital discharge follow-up    -  Primary   History of cardiac catheterization       Muscle strain of wrist, unspecified laterality, initial encounter       Pain in both wrists       Relevant Medications   Ibuprofen-Famotidine 800-26.6 MG TABS   Anxiety       Relevant Medications   ARIPiprazole (ABILIFY) 2 MG tablet   mirtazapine (REMERON) 15 MG tablet   Prediabetes       Screening for colon cancer       Follow up          Meds ordered this encounter  Medications  . Ibuprofen-Famotidine 800-26.6 MG TABS    Sig: Take 800 mg by mouth every 8 (eight) hours as needed.    Dispense:  90 tablet    Refill:  3  . DISCONTD: amLODipine (NORVASC) 10 MG tablet    Sig: Take 1 tablet (10 mg total) by mouth daily.    Dispense:  90 tablet    Refill:  1  . ARIPiprazole (ABILIFY) 2 MG tablet    Sig: Take 1 tablet (2 mg total) by mouth at bedtime.    Dispense:  30 tablet    Refill:  3  . isosorbide mononitrate (IMDUR) 60 MG 24 hr tablet    Sig: Take 1 tablet (60 mg total) by mouth daily.  Dispense:  30 tablet    Refill:  3  . LIVALO 4 MG TABS    Sig: Take 1 tablet (4 mg total) by mouth every morning.    Dispense:  30 tablet    Refill:  3  . metoprolol tartrate (LOPRESSOR) 100 MG tablet    Sig: Take 1 tablet (100 mg total) by mouth 2 (two) times daily.    Dispense:  60 tablet    Refill:  3  . mirtazapine (REMERON) 15 MG tablet    Sig: Take 1 tablet (15 mg total) by mouth at bedtime.    Dispense:  30 tablet    Refill:  3  . ticagrelor (BRILINTA) 90 MG TABS tablet     Sig: Take 1 tablet (90 mg total) by mouth 2 (two) times daily.    Dispense:  180 tablet    Refill:  1  . metFORMIN (GLUCOPHAGE) 500 MG tablet    Sig: Take 1 tablet (500 mg total) by mouth 2 (two) times daily with a meal.    Dispense:  180 tablet    Refill:  1  . amLODipine (NORVASC) 10 MG tablet    Sig: Take 1 tablet (10 mg total) by mouth daily.    Dispense:  90 tablet    Refill:  1    Follow-up: Return in about 3 months (around 07/05/2019).    Azzie Glatter, FNP

## 2019-04-05 ENCOUNTER — Ambulatory Visit: Payer: Medicaid Other | Admitting: Nurse Practitioner

## 2019-04-05 DIAGNOSIS — S66919A Strain of unspecified muscle, fascia and tendon at wrist and hand level, unspecified hand, initial encounter: Secondary | ICD-10-CM | POA: Insufficient documentation

## 2019-04-05 DIAGNOSIS — M25531 Pain in right wrist: Secondary | ICD-10-CM | POA: Insufficient documentation

## 2019-04-05 DIAGNOSIS — Z9889 Other specified postprocedural states: Secondary | ICD-10-CM | POA: Insufficient documentation

## 2019-04-05 DIAGNOSIS — F419 Anxiety disorder, unspecified: Secondary | ICD-10-CM | POA: Insufficient documentation

## 2019-04-05 DIAGNOSIS — R7303 Prediabetes: Secondary | ICD-10-CM | POA: Insufficient documentation

## 2019-05-04 ENCOUNTER — Ambulatory Visit: Payer: Medicaid Other | Admitting: Internal Medicine

## 2019-05-17 ENCOUNTER — Other Ambulatory Visit: Payer: Self-pay | Admitting: Family Medicine

## 2019-05-17 ENCOUNTER — Telehealth: Payer: Self-pay | Admitting: Family Medicine

## 2019-05-17 DIAGNOSIS — Z716 Tobacco abuse counseling: Secondary | ICD-10-CM

## 2019-05-17 MED ORDER — NICOTINE 14 MG/24HR TD PT24: 14.0000 mg | MEDICATED_PATCH | Freq: Every day | TRANSDERMAL | 3 refills | Status: DC

## 2019-05-18 NOTE — Telephone Encounter (Signed)
Patient aware.

## 2019-06-02 ENCOUNTER — Ambulatory Visit: Payer: Medicaid Other | Admitting: Internal Medicine

## 2019-06-08 ENCOUNTER — Other Ambulatory Visit: Payer: Self-pay | Admitting: Internal Medicine

## 2019-07-03 LAB — LIPID PANEL
Chol/HDL Ratio: 2.9 ratio (ref 0.0–5.0)
Cholesterol, Total: 137 mg/dL (ref 100–199)
HDL: 47 mg/dL (ref >39)
LDL Chol Calc (NIH): 74 mg/dL (ref 0–99)
Triglycerides: 81 mg/dL (ref 0–149)
VLDL Cholesterol Cal: 16 mg/dL (ref 5–40)

## 2019-07-05 ENCOUNTER — Ambulatory Visit (INDEPENDENT_AMBULATORY_CARE_PROVIDER_SITE_OTHER): Payer: Medicaid Other | Admitting: Family Medicine

## 2019-07-05 ENCOUNTER — Encounter: Payer: Self-pay | Admitting: Family Medicine

## 2019-07-05 ENCOUNTER — Other Ambulatory Visit: Payer: Self-pay

## 2019-07-05 VITALS — BP 127/75 | HR 60 | Temp 98.0°F | Ht 69.6 in | Wt 185.8 lb

## 2019-07-05 DIAGNOSIS — M25531 Pain in right wrist: Secondary | ICD-10-CM

## 2019-07-05 DIAGNOSIS — S66911D Strain of unspecified muscle, fascia and tendon at wrist and hand level, right hand, subsequent encounter: Secondary | ICD-10-CM

## 2019-07-05 DIAGNOSIS — M25511 Pain in right shoulder: Secondary | ICD-10-CM | POA: Diagnosis not present

## 2019-07-05 DIAGNOSIS — R7309 Other abnormal glucose: Secondary | ICD-10-CM

## 2019-07-05 DIAGNOSIS — M25512 Pain in left shoulder: Secondary | ICD-10-CM

## 2019-07-05 DIAGNOSIS — E1159 Type 2 diabetes mellitus with other circulatory complications: Secondary | ICD-10-CM

## 2019-07-05 DIAGNOSIS — N529 Male erectile dysfunction, unspecified: Secondary | ICD-10-CM

## 2019-07-05 DIAGNOSIS — R739 Hyperglycemia, unspecified: Secondary | ICD-10-CM

## 2019-07-05 DIAGNOSIS — B356 Tinea cruris: Secondary | ICD-10-CM

## 2019-07-05 DIAGNOSIS — Z09 Encounter for follow-up examination after completed treatment for conditions other than malignant neoplasm: Secondary | ICD-10-CM

## 2019-07-05 DIAGNOSIS — G8929 Other chronic pain: Secondary | ICD-10-CM

## 2019-07-05 LAB — POCT URINALYSIS DIPSTICK
Bilirubin, UA: NEGATIVE
Blood, UA: NEGATIVE
Glucose, UA: NEGATIVE
Ketones, UA: NEGATIVE
Leukocytes, UA: NEGATIVE
Nitrite, UA: NEGATIVE
Protein, UA: NEGATIVE
Spec Grav, UA: >=1.03 — AB (ref 1.010–1.025)
Urobilinogen, UA: 0.2 E.U./dL
pH, UA: 5.5 (ref 5.0–8.0)

## 2019-07-05 LAB — GLUCOSE, POCT (MANUAL RESULT ENTRY): POC Glucose: 126 mg/dl — AB (ref 70–99)

## 2019-07-05 LAB — POCT GLYCOSYLATED HEMOGLOBIN (HGB A1C): Hemoglobin A1C: 6.4 % — AB (ref 4.0–5.6)

## 2019-07-05 MED ORDER — SILDENAFIL CITRATE 50 MG PO TABS: 50.0000 mg | ORAL_TABLET | Freq: Every day | ORAL | 2 refills | Status: DC | PRN

## 2019-07-05 NOTE — Progress Notes (Addendum)
Patient [name]    Established Patient Office Visit  Subjective:  Patient ID: [name], male    DOB: [birth year]  Age: 59 y.o. MRN: [id number]  CC:  Chief Complaint  Patient presents with  . Follow-up    3 mth follow up DM, HTN    HPI [name] is a 59 year old male who presents for Follow Up today.   Past Medical History:  Diagnosis Date  . Allergies   . Arthritis   . Chest pain   . Chronic lower back pain   . Chronic pain of right wrist   . Coronary artery disease    a. Multiple prior caths/PCI. Cath 2013 with possible spasm of RCA, 70% ISR of mid LCx with subsequent DES to mLCx and prox LCX. b. H/o microvascular angina. c. Recurrent angina 08/2014 - s/p PTCA/DES to prox Cx, PTCA/CBA to OM1.  c. LHC 06/10/15 with patent stents and some ISR in LCX and OM-1 that was not flow limiting --> Rx   . Dyslipidemia    a. Intolerant to many statins except tolerating Livalo.  Marland Kitchen GERD (gastroesophageal reflux disease)   . H/O cardiac catheterization 10/25/2018  . Hypertension   . Myocardial infarction ([place]) ~ 2010  . S/P angioplasty with stent, DES, to proximal and mid LCX 12/15/11 12/15/2011  . Shoulder pain   . Stroke [facility])    pt. reports had a stroke around time of MI 2010  . Type II diabetes mellitus ([place])   . Unstable angina ([name])    Current Status: Since his last office visit, he continues to have chronic right wrist pain and bilateral shoulder pain, which he is currently taking Motrin for minimal relief. He is also wearing wrist bracelet. He states that he also continues to have jock itch and cream is not effective. He denies visual changes, chest pain, cough, shortness of breath, heart palpitations, and falls. He has occasional headaches and dizziness with position changes. Denies severe headaches, confusion, seizures, double vision, and blurred vision, nausea and vomiting. He denies fevers, chills, fatigue, recent infections,  weight loss, and night sweats.  No reports of GI problems such as nausea, vomiting, diarrhea, and constipation. He has no reports of blood in stools, dysuria and hematuria. His anxiety is mild today. He denies suicidal ideations, homicidal ideations, or auditory hallucinations.  Past Surgical History:  Procedure Laterality Date  . CARDIAC CATHETERIZATION  06/15/2002   LAD with prox 40% stenosis, norma L main, Cfx with 25% lesion, RCA with long mid 25% stenosis (Dr. Vita Barley)  . CARDIAC CATHETERIZATION  04/01/2010   normal L main, LAD wit mild stenosis, L Cfx with 70% in-stent restenosis, RCA with 70% in-stent restenosis, LVEF >60% (Dr. K. Mali Hilty) - cutting ballon arthrectomy to RCA & Cfx (Dr. Rockne Menghini)  . CARDIAC CATHETERIZATION  08/25/2010   preserved global LV contractility; multivessel CAD, diffuse 90-95% in-stent restenosis in prox placed Cfx stent - cutting balloon arthrectomy in Cfx with multiple dilatations 90-95% to 0% stenosis (Dr. Corky Downs)  . CARDIAC CATHETERIZATION  01/26/2011   PCI & stenting of aggresive in-stent restenosis within previously stented AV groove Cfx with 3.0x52m Taxus DES (previous stents were Promus) (Dr. JAdora Fridge  . CARDIAC CATHETERIZATION  05/11/2011   preserved LV function, 40% mid LAD stenosis, 30-40% narrowing proximal to stented semgnet of prox Cfx, patent mid RCA stent with smooth 20% narrowing in distal RCA (Dr. TCorky Downs  .  CARDIAC CATHETERIZATION  12/15/2011   PCI & stenting of proximal & mid Cfx with DES - 3.0x20m in proximal, 3.0x139min mid (Dr. J.Adora Fridge . CARDIAC CATHETERIZATION N/A 06/10/2015   Procedure: Left Heart Cath and Coronary Angiography;  Surgeon: DaJolaine ArtistMD;  [facility];  Service: Cardiovascular;  Laterality: N/A;  . CARDIAC CATHETERIZATION  10/25/2018  . cardiometabolic testing  08/05/39/5830 good exercise effort, peak VO2 79% predicted with normal VO2 HR curves (mild deconditioning)  . COLONOSCOPY   12/2012   diminutive hyperplastic sigmoid poyp so repeat routine 2024  . CORONARY BALLOON ANGIOPLASTY N/A 10/25/2018   Procedure: CORONARY BALLOON ANGIOPLASTY;  Surgeon: VaJettie BoozeMD;  [facility];  Service: Cardiovascular;  Laterality: N/A;  . EXCISIONAL HEMORRHOIDECTOMY  1984  . LEFT HEART CATH AND CORONARY ANGIOGRAPHY N/A 10/25/2018   Procedure: LEFT HEART CATH AND CORONARY ANGIOGRAPHY;  Surgeon: VaJettie BoozeMD;  [facility];  Service: Cardiovascular;  Laterality: N/A;  . LEFT HEART CATHETERIZATION WITH CORONARY ANGIOGRAM N/A 05/11/2011   Procedure: LEFT HEART CATHETERIZATION WITH CORONARY ANGIOGRAM;  Surgeon: ThTroy SineMD;  [facility];  Service: Cardiovascular;  Laterality: N/A;  Possible percutaneous coronary intervention, possible IVUS  . LEFT HEART CATHETERIZATION WITH CORONARY ANGIOGRAM N/A 12/15/2011   Procedure: LEFT HEART CATHETERIZATION WITH CORONARY ANGIOGRAM;  Surgeon: JoLorretta HarpMD;  [facility];  Service: Cardiovascular;  Laterality: N/A;  . LEFT HEART CATHETERIZATION WITH CORONARY ANGIOGRAM N/A 09/05/2014   Procedure: LEFT HEART CATHETERIZATION WITH CORONARY ANGIOGRAM;  Surgeon: ChBurnell BlanksMD;  [facility];  Service: Cardiovascular;  Laterality: N/A;  . LIPOMA EXCISION     back of the head  . NM MYOCAR PERF WALL MOTION  02/2012   lexiscan myoview; mild perfusion defect in mid inferolateral & basal inferolateral region (infarct/scar); EF 52%, abnormal but ow risk scan  . PERCUTANEOUS CORONARY STENT INTERVENTION (PCI-S)  09/05/2014   Procedure: PERCUTANEOUS CORONARY STENT INTERVENTION (PCI-S);  Surgeon: ChBurnell BlanksMD;  [facility];  Service: Cardiovascular;;    Family History  Problem Relation Age of Onset  . Leukemia Mother   . Prostate cancer Father   . Coronary artery disease Paternal Grandmother   . Cancer Paternal Grandfather   . Cancer Brother      Social History   Socioeconomic History  . Marital status: Divorced    Spouse name: Not on file  . Number of children: 2  . Years of education: GED  . Highest education level: Not on file  Occupational History  . Not on file  Tobacco Use  . Smoking status: Former Smoker    Packs/day: 1.00    Years: 10.00    Pack years: 10.00    Types: Cigarettes    Quit date: 10/07/2018    Years since quitting: 0.7  . Smokeless tobacco: Never Used  Substance and Sexual Activity  . Alcohol use: Yes  . Drug use: No    Comment: 12/15/11 "last cocaine was 2010"  . Sexual activity: Yes  Other Topics Concern  . Not on file  Social History Narrative  . Not on file   Social Determinants of Health   Financial Resource Strain:   . Difficulty of Paying Living Expenses: Not on file  Food Insecurity:   . Worried About RuCharity fundraisern the Last Year: Not on file  . Ran Out of  Food in the Last Year: Not on file  Transportation Needs:   . Lack of Transportation (Medical): Not on file  . Lack of Transportation (Non-Medical): Not on file  Physical Activity:   . Days of Exercise per Week: Not on file  . Minutes of Exercise per Session: Not on file  Stress:   . Feeling of Stress : Not on file  Social Connections:   . Frequency of Communication with Friends and Family: Not on file  . Frequency of Social Gatherings with Friends and Family: Not on file  . Attends Religious Services: Not on file  . Active Member of Clubs or Organizations: Not on file  . Attends Archivist Meetings: Not on file  . Marital Status: Not on file  Intimate Partner Violence:   . Fear of Current or Ex-Partner: Not on file  . Emotionally Abused: Not on file  . Physically Abused: Not on file  . Sexually Abused: Not on file    Outpatient Medications Prior to Visit  Medication Sig Dispense Refill  . amLODipine (NORVASC) 10 MG tablet Take 1 tablet (10 mg total) by mouth daily. 90 tablet 1  . aspirin 81 MG  chewable tablet Chew 1 tablet (81 mg total) by mouth daily. 30 tablet 3  . co-enzyme Q-10 30 MG capsule Take 30 mg by mouth daily.    . Ibuprofen-Famotidine 800-26.6 MG TABS Take 800 mg by mouth every 8 (eight) hours as needed. 90 tablet 3  . isosorbide mononitrate (IMDUR) 60 MG 24 hr tablet Take 1 tablet (60 mg total) by mouth daily. 30 tablet 3  . LIVALO 4 MG TABS Take 1 tablet (4 mg total) by mouth every morning. 30 tablet 3  . metFORMIN (GLUCOPHAGE) 500 MG tablet Take 1 tablet (500 mg total) by mouth 2 (two) times daily with a meal. 180 tablet 1  . metoprolol tartrate (LOPRESSOR) 100 MG tablet Take 1 tablet (100 mg total) by mouth 2 (two) times daily. 60 tablet 3  . ticagrelor (BRILINTA) 90 MG TABS tablet Take 1 tablet (90 mg total) by mouth 2 (two) times daily. 180 tablet 1  . ACCU-CHEK AVIVA PLUS test strip Use up to 3 times daily  11  . ACCU-CHEK FASTCLIX LANCETS MISC Use up to 3 times daily  11  . ARIPiprazole (ABILIFY) 2 MG tablet Take 1 tablet (2 mg total) by mouth at bedtime. (Patient not taking: Reported on 07/05/2019) 30 tablet 3  . Blood Glucose Monitoring Suppl (ACCU-CHEK AVIVA PLUS) w/Device KIT Use up to 3 times daily  0  . metFORMIN (GLUCOPHAGE-XR) 500 MG 24 hr tablet Take 1 tablet (500 mg total) by mouth daily with breakfast. For diabetes management 90 tablet 1  . mirtazapine (REMERON) 15 MG tablet Take 1 tablet (15 mg total) by mouth at bedtime. (Patient not taking: Reported on 07/05/2019) 30 tablet 3  . nicotine (NICODERM CQ - DOSED IN MG/24 HOURS) 14 mg/24hr patch Place 1 patch (14 mg total) onto the skin daily. 28 patch 3  . nitroGLYCERIN (NITROSTAT) 0.4 MG SL tablet Place 1 tablet (0.4 mg total) under the tongue every 5 (five) minutes x 3 doses as needed for chest pain. (Patient not taking: Reported on 01/04/2019) 25 tablet 2   No facility-administered medications prior to visit.    Allergies  Allergen Reactions  . Bee Venom Anaphylaxis and Hives  . Shellfish Allergy  Anaphylaxis and Hives  . Statins Other (See Comments)    Myalgias. Tolerating livalo.   Marland Kitchen  Testosterone Cypionate     Testerone Injection --Increased breast tissue     ROS Review of Systems  Constitutional: Negative.   HENT: Negative.   Eyes: Negative.   Respiratory: Negative.   Cardiovascular: Negative.   Gastrointestinal: Negative.   Endocrine: Negative.   Genitourinary: Negative.   Musculoskeletal: Positive for arthralgias (generalized joint pain).       Chronic right wrist pain Bilateral chronic shoulder pain  Skin: Negative.        Jock itch  Allergic/Immunologic: Negative.   Neurological: Negative.   Hematological: Negative.   Psychiatric/Behavioral: Negative.  Sleep disturbance: follow u(   Objective:    Physical Exam  Constitutional: He is oriented to person, place, and time. He appears well-developed and well-nourished.  Right wrist brace  HENT:  Head: Normocephalic and atraumatic.  Eyes: Conjunctivae are normal.  Cardiovascular: Normal rate, regular rhythm, normal heart sounds and intact distal pulses.  Pulmonary/Chest: Effort normal and breath sounds normal.  Abdominal: Soft. Bowel sounds are normal.  Musculoskeletal:     Cervical back: Normal range of motion and neck supple.     Comments: Right wrist pain  Neurological: He is alert and oriented to person, place, and time. He has normal reflexes.  Skin: Skin is dry.  Psychiatric: He has a normal mood and affect. His behavior is normal. Judgment and thought content normal.  Nursing note and vitals reviewed.   BP 127/75   Pulse 60   Temp 98 F (36.7 C) (Oral)   Ht 5' 9.6" (1.768 m)   Wt 185 lb 12.8 oz (84.3 kg)   SpO2 96%   BMI 26.97 kg/m  Wt Readings from Last 3 Encounters:  07/05/19 185 lb 12.8 oz (84.3 kg)  04/04/19 195 lb 12.8 oz (88.8 kg)  03/26/19 195 lb (88.5 kg)     Health Maintenance Due  Topic Date Due  . URINE MICROALBUMIN  10/28/1971  . INFLUENZA VACCINE  01/28/2019  . FOOT EXAM   03/03/2019    There are no preventive care reminders to display for this patient.  Lab Results  Component Value Date   TSH 1.280 07/05/2019   Lab Results  Component Value Date   WBC 4.0 07/05/2019   HGB 14.0 07/05/2019   HCT 43.1 07/05/2019   MCV 88 07/05/2019   PLT 249 07/05/2019   Lab Results  Component Value Date   NA 141 07/05/2019   K 4.4 07/05/2019   CO2 21 07/05/2019   GLUCOSE 113 (H) 07/05/2019   BUN 12 07/05/2019   CREATININE 1.00 07/05/2019   BILITOT 0.4 07/05/2019   ALKPHOS 61 07/05/2019   AST 24 07/05/2019   ALT 22 07/05/2019   PROT 6.8 07/05/2019   ALBUMIN 4.7 07/05/2019   CALCIUM 10.0 07/05/2019   ANIONGAP 9 03/26/2019   Lab Results  Component Value Date   CHOL 123 07/05/2019   Lab Results  Component Value Date   HDL 46 07/05/2019   Lab Results  Component Value Date   LDLCALC 62 07/05/2019   Lab Results  Component Value Date   TRIG 74 07/05/2019   Lab Results  Component Value Date   CHOLHDL 2.7 07/05/2019   Lab Results  Component Value Date   HGBA1C 6.4 (A) 07/05/2019    Assessment & Plan:   1. Chronic pain of right wrist We will evaluate insurance coverage, for future CT scan of wrist, and assessment of Carpal Tunnel.   2. Muscle strain of right wrist, subsequent encounter Continue to wear  wrist brace, take Motrin as needed, and use RICE methods for aide in pain relief.   3. Bilateral chronic shoulder pain We will assess possible CT scan for chronic shoulder pain at next office visit.   4. Type 2 diabetes mellitus with other circulatory complication, without long-term current use of insulin ([place]) He will continue medication as prescribed, to decrease foods/beverages high in sugars and carbs and follow Heart Healthy or DASH diet. Increase physical activity to at least 30 minutes cardio exercise daily.  - POCT HgB A1C - Urinalysis Dipstick - Glucose (CBG) - PSA - TSH - CBC with Differential - Comp Met (CMET) - Lipid Panel -  Vitamin B12 - Vitamin D, 25-hydroxy  4. Hyperglycemia Glucose stable at 126 today.   5. Hemoglobin A1c less than 7.0% Hgb A1c at 6.4 today.   6. Jock itch We will initiate Diflucan today.   7. Tinea cruris  8. Erectile dysfunction, unspecified erectile dysfunction type - sildenafil (VIAGRA) 50 MG tablet; Take 1 tablet (50 mg total) by mouth daily as needed for erectile dysfunction.  Dispense: 8 tablet; Refill: 2  9. Follow up He will follow up in 3 months.   Meds ordered this encounter  Medications  . sildenafil (VIAGRA) 50 MG tablet    Sig: Take 1 tablet (50 mg total) by mouth daily as needed for erectile dysfunction.    Dispense:  8 tablet    Refill:  2  . fluconazole (DIFLUCAN) 150 MG tablet    Sig: Take 1 tablet weekly X 4 weeks.    Dispense:  4 tablet    Refill:  0    Orders Placed This Encounter  Procedures  . PSA  . TSH  . CBC with Differential  . Comp Met (CMET)  . Lipid Panel  . Vitamin B12  . Vitamin D, 25-hydroxy  . POCT HgB A1C  . Urinalysis Dipstick  . Glucose (CBG)    Referral Orders  No referral(s) requested today    Kathe Becton,  MSN, FNP-BC [place], [place] [address] [phone]- fax   Problem List Items Addressed This Visit      Endocrine   DM2 (diabetes mellitus, type 2) ([place]) (Chronic)   Relevant Orders   POCT HgB A1C (Completed)   Urinalysis Dipstick (Completed)   Glucose (CBG) (Completed)   PSA (Completed)   TSH (Completed)   CBC with Differential (Completed)   Comp Met (CMET) (Completed)   Lipid Panel (Completed)   Vitamin B12 (Completed)   Vitamin D, 25-hydroxy (Completed)     Musculoskeletal and Integument   Muscle strain of wrist   Tinea cruris   Relevant Medications   fluconazole (DIFLUCAN) 150 MG tablet     Other   ED (erectile dysfunction)   Relevant Medications   sildenafil (VIAGRA) 50 MG tablet    Hemoglobin A1c less than 7.0%    Other Visit Diagnoses    Chronic pain of right wrist    -  Primary   Bilateral shoulder pain, unspecified chronicity       Hyperglycemia       Jock itch       Relevant Medications   fluconazole (DIFLUCAN) 150 MG tablet   Follow up          Meds ordered this encounter  Medications  . sildenafil (VIAGRA) 50 MG tablet    Sig: Take 1 tablet (50 mg total) by mouth  daily as needed for erectile dysfunction.    Dispense:  8 tablet    Refill:  2  . fluconazole (DIFLUCAN) 150 MG tablet    Sig: Take 1 tablet weekly X 4 weeks.    Dispense:  4 tablet    Refill:  0    Follow-up: Return in about 3 months (around 10/03/2019).    Azzie Glatter, FNP

## 2019-07-06 DIAGNOSIS — B356 Tinea cruris: Secondary | ICD-10-CM | POA: Insufficient documentation

## 2019-07-06 DIAGNOSIS — R7309 Other abnormal glucose: Secondary | ICD-10-CM | POA: Insufficient documentation

## 2019-07-06 LAB — COMPREHENSIVE METABOLIC PANEL
ALT: 22 IU/L (ref 0–44)
AST: 24 IU/L (ref 0–40)
Albumin/Globulin Ratio: 2.2 (ref 1.2–2.2)
Albumin: 4.7 g/dL (ref 3.8–4.9)
Alkaline Phosphatase: 61 IU/L (ref 39–117)
BUN/Creatinine Ratio: 12 (ref 9–20)
BUN: 12 mg/dL (ref 6–24)
Bilirubin Total: 0.4 mg/dL (ref 0.0–1.2)
CO2: 21 mmol/L (ref 20–29)
Calcium: 10 mg/dL (ref 8.7–10.2)
Chloride: 106 mmol/L (ref 96–106)
Creatinine, Ser: 1 mg/dL (ref 0.76–1.27)
GFR calc Af Amer: 96 mL/min/{1.73_m2} (ref >59)
GFR calc non Af Amer: 83 mL/min/{1.73_m2} (ref >59)
Globulin, Total: 2.1 g/dL (ref 1.5–4.5)
Glucose: 113 mg/dL — ABNORMAL HIGH (ref 65–99)
Potassium: 4.4 mmol/L (ref 3.5–5.2)
Sodium: 141 mmol/L (ref 134–144)
Total Protein: 6.8 g/dL (ref 6.0–8.5)

## 2019-07-06 LAB — CBC WITH DIFFERENTIAL/PLATELET
Basophils Absolute: 0 10*3/uL (ref 0.0–0.2)
Basos: 1 %
EOS (ABSOLUTE): 0.3 10*3/uL (ref 0.0–0.4)
Eos: 7 %
Hematocrit: 43.1 % (ref 37.5–51.0)
Hemoglobin: 14 g/dL (ref 13.0–17.7)
Immature Grans (Abs): 0 10*3/uL (ref 0.0–0.1)
Immature Granulocytes: 0 %
Lymphocytes Absolute: 1.6 10*3/uL (ref 0.7–3.1)
Lymphs: 41 %
MCH: 28.5 pg (ref 26.6–33.0)
MCHC: 32.5 g/dL (ref 31.5–35.7)
MCV: 88 fL (ref 79–97)
Monocytes Absolute: 0.4 10*3/uL (ref 0.1–0.9)
Monocytes: 9 %
Neutrophils Absolute: 1.7 10*3/uL (ref 1.4–7.0)
Neutrophils: 42 %
Platelets: 249 10*3/uL (ref 150–450)
RBC: 4.92 x10E6/uL (ref 4.14–5.80)
RDW: 13.4 % (ref 11.6–15.4)
WBC: 4 10*3/uL (ref 3.4–10.8)

## 2019-07-06 LAB — VITAMIN B12: Vitamin B-12: 415 pg/mL (ref 232–1245)

## 2019-07-06 LAB — LIPID PANEL
Chol/HDL Ratio: 2.7 ratio (ref 0.0–5.0)
Cholesterol, Total: 123 mg/dL (ref 100–199)
HDL: 46 mg/dL (ref >39)
LDL Chol Calc (NIH): 62 mg/dL (ref 0–99)
Triglycerides: 74 mg/dL (ref 0–149)
VLDL Cholesterol Cal: 15 mg/dL (ref 5–40)

## 2019-07-06 LAB — PSA: Prostate Specific Ag, Serum: 0.8 ng/mL (ref 0.0–4.0)

## 2019-07-06 LAB — TSH: TSH: 1.28 u[IU]/mL (ref 0.450–4.500)

## 2019-07-06 LAB — VITAMIN D 25 HYDROXY (VIT D DEFICIENCY, FRACTURES): Vit D, 25-Hydroxy: 26.1 ng/mL — ABNORMAL LOW (ref 30.0–100.0)

## 2019-07-06 MED ORDER — FLUCONAZOLE 150 MG PO TABS: ORAL_TABLET | ORAL | 0 refills | Status: DC

## 2019-07-07 ENCOUNTER — Other Ambulatory Visit: Payer: Self-pay | Admitting: Family Medicine

## 2019-07-07 ENCOUNTER — Encounter: Payer: Self-pay | Admitting: Family Medicine

## 2019-07-07 ENCOUNTER — Ambulatory Visit: Payer: Medicaid Other | Admitting: Internal Medicine

## 2019-07-07 DIAGNOSIS — S66911D Strain of unspecified muscle, fascia and tendon at wrist and hand level, right hand, subsequent encounter: Secondary | ICD-10-CM

## 2019-07-07 DIAGNOSIS — G8929 Other chronic pain: Secondary | ICD-10-CM

## 2019-07-07 DIAGNOSIS — M25531 Pain in right wrist: Secondary | ICD-10-CM

## 2019-07-10 ENCOUNTER — Other Ambulatory Visit: Payer: Self-pay

## 2019-07-10 DIAGNOSIS — N529 Male erectile dysfunction, unspecified: Secondary | ICD-10-CM

## 2019-07-10 MED ORDER — SILDENAFIL CITRATE 50 MG PO TABS: 50.0000 mg | ORAL_TABLET | Freq: Every day | ORAL | 2 refills | Status: DC | PRN

## 2019-07-13 ENCOUNTER — Other Ambulatory Visit: Payer: Self-pay | Admitting: Family Medicine

## 2019-07-13 DIAGNOSIS — M25531 Pain in right wrist: Secondary | ICD-10-CM

## 2019-07-13 DIAGNOSIS — G8929 Other chronic pain: Secondary | ICD-10-CM

## 2019-07-14 ENCOUNTER — Telehealth: Payer: Self-pay

## 2019-07-14 NOTE — Telephone Encounter (Signed)
Message left on patient voice mail. To call  [facility] for an appointment or go to Radiology for x-ray of wrist.  Order has been placed by [facility] NP.

## 2019-07-15 ENCOUNTER — Emergency Department (HOSPITAL_COMMUNITY): Payer: Medicaid Other

## 2019-07-15 ENCOUNTER — Encounter (HOSPITAL_COMMUNITY): Payer: Self-pay | Admitting: Emergency Medicine

## 2019-07-15 ENCOUNTER — Emergency Department (HOSPITAL_COMMUNITY)
Admission: EM | Admit: 2019-07-15 | Discharge: 2019-07-15 | Disposition: A | Discharge status: Home / Self Care | Payer: Medicaid Other | Attending: Emergency Medicine | Admitting: Emergency Medicine

## 2019-07-15 DIAGNOSIS — E119 Type 2 diabetes mellitus without complications: Secondary | ICD-10-CM | POA: Diagnosis not present

## 2019-07-15 DIAGNOSIS — Z955 Presence of coronary angioplasty implant and graft: Secondary | ICD-10-CM | POA: Diagnosis not present

## 2019-07-15 DIAGNOSIS — Z87891 Personal history of nicotine dependence: Secondary | ICD-10-CM | POA: Insufficient documentation

## 2019-07-15 DIAGNOSIS — Y999 Unspecified external cause status: Secondary | ICD-10-CM | POA: Diagnosis not present

## 2019-07-15 DIAGNOSIS — Y929 Unspecified place or not applicable: Secondary | ICD-10-CM | POA: Insufficient documentation

## 2019-07-15 DIAGNOSIS — Z7984 Long term (current) use of oral hypoglycemic drugs: Secondary | ICD-10-CM | POA: Insufficient documentation

## 2019-07-15 DIAGNOSIS — S63502A Unspecified sprain of left wrist, initial encounter: Secondary | ICD-10-CM

## 2019-07-15 DIAGNOSIS — I10 Essential (primary) hypertension: Secondary | ICD-10-CM | POA: Diagnosis not present

## 2019-07-15 DIAGNOSIS — Z79899 Other long term (current) drug therapy: Secondary | ICD-10-CM | POA: Diagnosis not present

## 2019-07-15 DIAGNOSIS — Y9355 Activity, bike riding: Secondary | ICD-10-CM | POA: Insufficient documentation

## 2019-07-15 DIAGNOSIS — I2510 Atherosclerotic heart disease of native coronary artery without angina pectoris: Secondary | ICD-10-CM | POA: Diagnosis not present

## 2019-07-15 DIAGNOSIS — S6992XA Unspecified injury of left wrist, hand and finger(s), initial encounter: Secondary | ICD-10-CM | POA: Diagnosis present

## 2019-07-15 MED ORDER — IBUPROFEN 800 MG PO TABS: 800.0000 mg | ORAL_TABLET | Freq: Three times a day (TID) | ORAL | 0 refills | Status: DC | PRN

## 2019-07-15 NOTE — Discharge Instructions (Addendum)
Return here as needed. Follow up with the doctor provided if not improving over the next several weeks. Ice and elevate your wrist
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
## 2019-07-18 ENCOUNTER — Telehealth: Payer: Self-pay
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
## 2019-07-18 NOTE — Telephone Encounter (Signed)
Please inform patient that most recent X-ray is negative for any abnormalities. He should continue to use wrist brace as needed and adapt RICE daily. He will take OTC pain medications as needed  Message left on voice mail.

## 2019-07-24 ENCOUNTER — Telehealth: Payer: Self-pay | Admitting: Internal Medicine

## 2019-07-24 ENCOUNTER — Ambulatory Visit (HOSPITAL_COMMUNITY): Payer: Medicaid Other

## 2019-07-24 NOTE — Telephone Encounter (Signed)
LM for patient that Aug 01, 2019 visit is a telemedicine appointment - MD is not in office. Asked that patient call back to confirm this visit is OK or r/s if he prefers in office. Appointment type has been changed.

## 2019-07-30 ENCOUNTER — Other Ambulatory Visit: Payer: Self-pay | Admitting: Family Medicine

## 2019-07-30 DIAGNOSIS — I10 Essential (primary) hypertension: Secondary | ICD-10-CM

## 2019-08-01 ENCOUNTER — Telehealth (INDEPENDENT_AMBULATORY_CARE_PROVIDER_SITE_OTHER): Payer: Medicaid Other | Admitting: Internal Medicine

## 2019-08-01 ENCOUNTER — Encounter: Payer: Self-pay | Admitting: Internal Medicine

## 2019-08-01 VITALS — BP 102/60 | HR 57 | Ht 68.0 in | Wt 182.5 lb

## 2019-08-01 DIAGNOSIS — Z9861 Coronary angioplasty status: Secondary | ICD-10-CM | POA: Diagnosis not present

## 2019-08-01 DIAGNOSIS — I10 Essential (primary) hypertension: Secondary | ICD-10-CM

## 2019-08-01 DIAGNOSIS — I208 Other forms of angina pectoris: Secondary | ICD-10-CM

## 2019-08-01 DIAGNOSIS — I25119 Atherosclerotic heart disease of native coronary artery with unspecified angina pectoris: Secondary | ICD-10-CM

## 2019-08-01 DIAGNOSIS — E7800 Pure hypercholesterolemia, unspecified: Secondary | ICD-10-CM

## 2019-08-01 DIAGNOSIS — I2510 Atherosclerotic heart disease of native coronary artery without angina pectoris: Secondary | ICD-10-CM

## 2019-08-01 MED ORDER — ISOSORBIDE MONONITRATE ER 60 MG PO TB24: 90.0000 mg | ORAL_TABLET | Freq: Every day | ORAL | 3 refills | Status: DC

## 2019-08-01 NOTE — Progress Notes (Signed)
Virtual Visit via Telephone Note   This visit type was conducted due to national recommendations for restrictions regarding the COVID-19 Pandemic (e.g. social distancing) in an effort to limit this patient's exposure and mitigate transmission in our community.  Due to his co-morbid illnesses, this patient is at least at moderate risk for complications without adequate follow up.  This format is felt to be most appropriate for this patient at this time.  The patient did not have access to video technology/had technical difficulties with video requiring transitioning to audio format only (telephone).  All issues noted in this document were discussed and addressed.  No physical exam could be performed with this format.  Please refer to the patient's chart for his  consent to telehealth for [facility].   Evaluation Performed:  Telephone visit  Date:  08/01/2019   ID:  Lawson Fiscal, DOB 25-Dec-1960, MRN [id number]  Patient Location:  [place] [address]  Provider location:   [address] [place], [place] [address]  PCP:  Azzie Glatter, FNP  Cardiologist:  Pixie Casino, MD Electrophysiologist:  None   Chief Complaint:  Exertional chest pain  History of Present Illness:    [name] is a 59 y.o. male who presents via audio/video conferencing for a telehealth visit today.  Mr. Pasqual was seen today for telephone follow-up.  Over the past few months he has been in the ER on occasion with chest pain symptoms.  He says since his last PCI he never really had significant improvement in chest discomfort and has been somewhat persistent.  Recently he has had some more exertional chest pain symptoms.  This occurs when he starts to exercise and sometimes can be limiting.  He does not seem to get chest pain at rest.  He feels like the nitrate may not be working quite as well.  Of note sildenafil is on his med list.  He understands this medicine is not  advised to be used in patients who take long-acting nitrates.  The patient does not have symptoms concerning for COVID-19 infection (fever, chills, cough, or new SHORTNESS OF BREATH).    Prior CV studies:   The following studies were reviewed today:  Chart reviewed  PMHx:  Past Medical History:  Diagnosis Date  . Allergies   . Arthritis   . Chest pain   . Chronic lower back pain   . Chronic pain of right wrist   . Coronary artery disease    a. Multiple prior caths/PCI. Cath 2013 with possible spasm of RCA, 70% ISR of mid LCx with subsequent DES to mLCx and prox LCX. b. H/o microvascular angina. c. Recurrent angina 08/2014 - s/p PTCA/DES to prox Cx, PTCA/CBA to OM1.  c. LHC 06/10/15 with patent stents and some ISR in LCX and OM-1 that was not flow limiting --> Rx   . Dyslipidemia    a. Intolerant to many statins except tolerating Livalo.  Marland Kitchen GERD (gastroesophageal reflux disease)   . H/O cardiac catheterization 10/25/2018  . Hypertension   . Myocardial infarction ([place]) ~ 2010  . S/P angioplasty with stent, DES, to proximal and mid LCX 12/15/11 12/15/2011  . Shoulder pain   . Stroke [facility])    pt. reports had a stroke around time of MI 2010  . Type II diabetes mellitus ([place])   . Unstable angina [facility])     Past Surgical History:  Procedure Laterality Date  . CARDIAC  CATHETERIZATION  06/15/2002   LAD with prox 40% stenosis, norma L main, Cfx with 25% lesion, RCA with long mid 25% stenosis (Dr. Vita Barley)  . CARDIAC CATHETERIZATION  04/01/2010   normal L main, LAD wit mild stenosis, L Cfx with 70% in-stent restenosis, RCA with 70% in-stent restenosis, LVEF >60% (Dr. K. Mali Tommy Minichiello) - cutting ballon arthrectomy to RCA & Cfx (Dr. Rockne Menghini)  . CARDIAC CATHETERIZATION  08/25/2010   preserved global LV contractility; multivessel CAD, diffuse 90-95% in-stent restenosis in prox placed Cfx stent - cutting balloon arthrectomy in Cfx with multiple dilatations 90-95% to 0% stenosis (Dr. Corky Downs)    . CARDIAC CATHETERIZATION  01/26/2011   PCI & stenting of aggresive in-stent restenosis within previously stented AV groove Cfx with 3.0x20mm Taxus DES (previous stents were Promus) (Dr. Adora Fridge)  . CARDIAC CATHETERIZATION  05/11/2011   preserved LV function, 40% mid LAD stenosis, 30-40% narrowing proximal to stented semgnet of prox Cfx, patent mid RCA stent with smooth 20% narrowing in distal RCA (Dr. Corky Downs)  . CARDIAC CATHETERIZATION  12/15/2011   PCI & stenting of proximal & mid Cfx with DES - 3.0x52mm in proximal, 3.0x73mm in mid (Dr. Adora Fridge)  . CARDIAC CATHETERIZATION N/A 06/10/2015   Procedure: Left Heart Cath and Coronary Angiography;  Surgeon: Jolaine Artist, MD;  Location: [place] CV LAB;  Service: Cardiovascular;  Laterality: N/A;  . CARDIAC CATHETERIZATION  10/25/2018  . cardiometabolic testing  123456   good exercise effort, peak VO2 79% predicted with normal VO2 HR curves (mild deconditioning)  . COLONOSCOPY  12/2012   diminutive hyperplastic sigmoid poyp so repeat routine 2024  . CORONARY BALLOON ANGIOPLASTY N/A 10/25/2018   Procedure: CORONARY BALLOON ANGIOPLASTY;  Surgeon: Jettie Booze, MD;  Location: [place] CV LAB;  Service: Cardiovascular;  Laterality: N/A;  . EXCISIONAL HEMORRHOIDECTOMY  1984  . LEFT HEART CATH AND CORONARY ANGIOGRAPHY N/A 10/25/2018   Procedure: LEFT HEART CATH AND CORONARY ANGIOGRAPHY;  Surgeon: Jettie Booze, MD;  Location: [place] CV LAB;  Service: Cardiovascular;  Laterality: N/A;  . LEFT HEART CATHETERIZATION WITH CORONARY ANGIOGRAM N/A 05/11/2011   Procedure: LEFT HEART CATHETERIZATION WITH CORONARY ANGIOGRAM;  Surgeon: Troy Sine, MD;  Location: [facility] CATH LAB;  Service: Cardiovascular;  Laterality: N/A;  Possible percutaneous coronary intervention, possible IVUS  . LEFT HEART CATHETERIZATION WITH CORONARY ANGIOGRAM N/A 12/15/2011   Procedure: LEFT HEART CATHETERIZATION WITH CORONARY ANGIOGRAM;  Surgeon: Lorretta Harp, MD;  Location: [facility] CATH LAB;  Service: Cardiovascular;  Laterality: N/A;  . LEFT HEART CATHETERIZATION WITH CORONARY ANGIOGRAM N/A 09/05/2014   Procedure: LEFT HEART CATHETERIZATION WITH CORONARY ANGIOGRAM;  Surgeon: Burnell Blanks, MD;  Location: [facility] CATH LAB;  Service: Cardiovascular;  Laterality: N/A;  . LIPOMA EXCISION     back of the head  . NM MYOCAR PERF WALL MOTION  02/2012   lexiscan myoview; mild perfusion defect in mid inferolateral & basal inferolateral region (infarct/scar); EF 52%, abnormal but ow risk scan  . PERCUTANEOUS CORONARY STENT INTERVENTION (PCI-S)  09/05/2014   Procedure: PERCUTANEOUS CORONARY STENT INTERVENTION (PCI-S);  Surgeon: Burnell Blanks, MD;  Location: [facility] CATH LAB;  Service: Cardiovascular;;    FAMHx:  Family History  Problem Relation Age of Onset  . Leukemia Mother   . Prostate cancer Father   . Coronary artery disease Paternal Grandmother   . Cancer Paternal Grandfather   . Cancer Brother     SOCHx:   reports that he  quit smoking about 9 months ago. His smoking use included cigarettes. He has a 10.00 pack-year smoking history. He has never used smokeless tobacco. He reports current alcohol use. He reports that he does not use drugs.  ALLERGIES:  Allergies  Allergen Reactions  . Bee Venom Anaphylaxis and Hives  . Shellfish Allergy Anaphylaxis and Hives  . Statins Other (See Comments)    Myalgias. Tolerating livalo.   . Testosterone Cypionate     Testerone Injection --Increased breast tissue     MEDS:  Current Meds  Medication Sig  . amLODipine (NORVASC) 10 MG tablet Take 1 tablet (10 mg total) by mouth daily.  Marland Kitchen aspirin 81 MG chewable tablet Chew 1 tablet (81 mg total) by mouth daily.  Marland Kitchen co-enzyme Q-10 30 MG capsule Take 30 mg by mouth daily.  . isosorbide mononitrate (IMDUR) 60 MG 24 hr tablet Take 1 tablet by mouth once daily  . LIVALO 4 MG TABS TAKE 1 TABLET BY MOUTH ONCE DAILY IN THE MORNING  . metFORMIN  (GLUCOPHAGE) 500 MG tablet Take 1 tablet (500 mg total) by mouth 2 (two) times daily with a meal.  . metoprolol tartrate (LOPRESSOR) 100 MG tablet Take 1 tablet by mouth twice daily  . sildenafil (VIAGRA) 50 MG tablet Take 1 tablet (50 mg total) by mouth daily as needed for erectile dysfunction.  . ticagrelor (BRILINTA) 90 MG TABS tablet Take 1 tablet (90 mg total) by mouth 2 (two) times daily.     ROS: Pertinent items noted in HPI and remainder of comprehensive ROS otherwise negative.  Labs/Other Tests and Data Reviewed:    Recent Labs: 07/05/2019: ALT 22; BUN 12; Creatinine, Ser 1.00; Hemoglobin 14.0; Platelets 249; Potassium 4.4; Sodium 141; TSH 1.280   Recent Lipid Panel Lab Results  Component Value Date/Time   CHOL 123 07/05/2019 08:59 AM   CHOL 226 (H) 02/15/2014 08:37 AM   TRIG 74 07/05/2019 08:59 AM   TRIG 208 (H) 02/15/2014 08:37 AM   HDL 46 07/05/2019 08:59 AM   HDL 42 02/15/2014 08:37 AM   CHOLHDL 2.7 07/05/2019 08:59 AM   CHOLHDL 3.5 10/26/2018 12:17 AM   LDLCALC 62 07/05/2019 08:59 AM   LDLCALC 142 (H) 02/15/2014 08:37 AM    Wt Readings from Last 3 Encounters:  08/01/19 182 lb 8 oz (82.8 kg)  07/05/19 185 lb 12.8 oz (84.3 kg)  04/04/19 195 lb 12.8 oz (88.8 kg)     Exam:    Vital Signs:  BP 102/60   Pulse (!) 57   Ht 5\' 8"  (1.727 m)   Wt 182 lb 8 oz (82.8 kg)   BMI 27.75 kg/m    Exam not performed due to telephone visit  ASSESSMENT & PLAN:    1. Chest pain at rest 2. Recent unstable angina status post PTCA to the circumflex and marginal branches for in-stent restenosis 3. Coronary artery disease status post multiple PCI as with the last stent to the circumflex in 2013 4. Dyslipidemia-on Livalo (intolerant to atorvastatin, simvastatin and rosuvastatin) 5. Hypertension 6. 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 Erectile dysfunction  Mr. Lannin continues to have stable angina symptoms.  Recently he may have had more discomfort with exercise.  I  have encouraged him to continue to exercise as we know his anatomy well and I think that he likely has small vessel disease.  We will tried an increase in his Imdur from 60 to 90 mg daily.  Ultimately we could also consider adding Ranexa  at some point.  I advised him to discontinue sildenafil which she says he rarely uses anyhow, especially in the setting of long-acting nitrate which is contraindicated.  COVID-19 Education: The signs and symptoms of COVID-19 were discussed with the patient and how to seek care for testing (follow up with PCP or arrange E-visit).  The importance of social distancing was discussed today.  Patient Risk:   After full review of this patients clinical status, I feel that they are at least moderate risk at this time.  Time:   Today, I have spent 25 minutes with the patient with telehealth technology discussing chest pain.     Medication Adjustments/Labs and Tests Ordered: Current medicines are reviewed at length with the patient today.  Concerns regarding medicines are outlined above.   Tests Ordered: No orders of the defined types were placed in this encounter.   Medication Changes: No orders of the defined types were placed in this encounter.   Disposition:  in 6 month(s)  Pixie Casino, MD, [facility] [place] Director of the Advanced Lipid Disorders &  Cardiovascular Risk Reduction Clinic Diplomate of the American Board of Clinical Lipidology Attending Cardiologist  Direct Dial: [phone]  Fax: [phone]  Website:  [email or web address]  Pixie Casino, MD  08/01/2019 8:21 AM

## 2019-08-01 NOTE — Patient Instructions (Signed)
Medication Instructions:  Increase Imdur to 90 mg daily Stop: Sildenafil  Continue to check blood pressure daily. If blood pressure continues to remain low, you may decrease amlodipine to 5 mg daily.  *If you need a refill on your cardiac medications before your next appointment, please call your pharmacy*  Lab Work: None  Testing/Procedures: None  Follow-Up: At [facility], you and your health needs are our priority.  As part of our continuing mission to provide you with exceptional heart care, we have created designated Provider Care Teams.  These Care Teams include your primary Cardiologist (physician) and Advanced Practice Providers (APPs -  Physician Assistants and Nurse Practitioners) who all work together to provide you with the care you need, when you need it.  Your next appointment:   6 month(s)  The format for your next appointment:   In Person  Provider:   K. Mali Hilty, MD

## 2019-08-03 ENCOUNTER — Other Ambulatory Visit: Payer: Self-pay | Admitting: Family Medicine

## 2019-08-03 ENCOUNTER — Telehealth: Payer: Self-pay | Admitting: Family Medicine

## 2019-08-03 DIAGNOSIS — M25531 Pain in right wrist: Secondary | ICD-10-CM

## 2019-08-03 DIAGNOSIS — G8929 Other chronic pain: Secondary | ICD-10-CM

## 2019-08-03 DIAGNOSIS — M25511 Pain in right shoulder: Secondary | ICD-10-CM

## 2019-08-03 NOTE — Telephone Encounter (Signed)
Patient requesting a referral to ortho.

## 2019-08-16 ENCOUNTER — Ambulatory Visit: Payer: Medicaid Other | Admitting: Orthopaedic Surgery

## 2019-08-25 ENCOUNTER — Other Ambulatory Visit: Payer: Self-pay | Admitting: Family Medicine

## 2019-08-25 DIAGNOSIS — E1159 Type 2 diabetes mellitus with other circulatory complications: Secondary | ICD-10-CM

## 2019-09-07 ENCOUNTER — Encounter (HOSPITAL_COMMUNITY): Payer: Self-pay | Admitting: Behavioral Health

## 2019-09-07 ENCOUNTER — Other Ambulatory Visit: Payer: Self-pay | Admitting: Behavioral Health

## 2019-09-07 ENCOUNTER — Other Ambulatory Visit: Payer: Self-pay

## 2019-09-07 ENCOUNTER — Inpatient Hospital Stay (HOSPITAL_COMMUNITY)
Admission: AD | Admit: 2019-09-07 | Discharge: 2019-09-11 | DRG: 885 | Disposition: A | Discharge status: Home / Self Care | Payer: Medicaid Other | Source: Intra-hospital | Attending: Psychiatry | Admitting: Psychiatry

## 2019-09-07 DIAGNOSIS — F419 Anxiety disorder, unspecified: Secondary | ICD-10-CM | POA: Diagnosis present

## 2019-09-07 DIAGNOSIS — F323 Major depressive disorder, single episode, severe with psychotic features: Principal | ICD-10-CM | POA: Diagnosis present

## 2019-09-07 DIAGNOSIS — Z79899 Other long term (current) drug therapy: Secondary | ICD-10-CM

## 2019-09-07 DIAGNOSIS — Z7984 Long term (current) use of oral hypoglycemic drugs: Secondary | ICD-10-CM | POA: Diagnosis not present

## 2019-09-07 DIAGNOSIS — I252 Old myocardial infarction: Secondary | ICD-10-CM | POA: Diagnosis not present

## 2019-09-07 DIAGNOSIS — Z8673 Personal history of transient ischemic attack (TIA), and cerebral infarction without residual deficits: Secondary | ICD-10-CM

## 2019-09-07 DIAGNOSIS — Z955 Presence of coronary angioplasty implant and graft: Secondary | ICD-10-CM

## 2019-09-07 DIAGNOSIS — Z888 Allergy status to other drugs, medicaments and biological substances status: Secondary | ICD-10-CM

## 2019-09-07 DIAGNOSIS — Z91030 Bee allergy status: Secondary | ICD-10-CM | POA: Diagnosis not present

## 2019-09-07 DIAGNOSIS — I2510 Atherosclerotic heart disease of native coronary artery without angina pectoris: Secondary | ICD-10-CM | POA: Diagnosis present

## 2019-09-07 DIAGNOSIS — G4700 Insomnia, unspecified: Secondary | ICD-10-CM | POA: Diagnosis present

## 2019-09-07 DIAGNOSIS — Z91013 Allergy to seafood: Secondary | ICD-10-CM

## 2019-09-07 DIAGNOSIS — Z20822 Contact with and (suspected) exposure to covid-19: Secondary | ICD-10-CM | POA: Diagnosis present

## 2019-09-07 DIAGNOSIS — Z818 Family history of other mental and behavioral disorders: Secondary | ICD-10-CM | POA: Diagnosis not present

## 2019-09-07 DIAGNOSIS — Z7902 Long term (current) use of antithrombotics/antiplatelets: Secondary | ICD-10-CM | POA: Diagnosis not present

## 2019-09-07 DIAGNOSIS — R45851 Suicidal ideations: Secondary | ICD-10-CM | POA: Diagnosis present

## 2019-09-07 DIAGNOSIS — E119 Type 2 diabetes mellitus without complications: Secondary | ICD-10-CM | POA: Diagnosis present

## 2019-09-07 DIAGNOSIS — F1021 Alcohol dependence, in remission: Secondary | ICD-10-CM | POA: Diagnosis present

## 2019-09-07 DIAGNOSIS — Z7982 Long term (current) use of aspirin: Secondary | ICD-10-CM

## 2019-09-07 DIAGNOSIS — Z9114 Patient's other noncompliance with medication regimen: Secondary | ICD-10-CM

## 2019-09-07 DIAGNOSIS — F332 Major depressive disorder, recurrent severe without psychotic features: Secondary | ICD-10-CM | POA: Diagnosis not present

## 2019-09-07 DIAGNOSIS — I10 Essential (primary) hypertension: Secondary | ICD-10-CM | POA: Diagnosis present

## 2019-09-07 DIAGNOSIS — K219 Gastro-esophageal reflux disease without esophagitis: Secondary | ICD-10-CM | POA: Diagnosis present

## 2019-09-07 DIAGNOSIS — Z8249 Family history of ischemic heart disease and other diseases of the circulatory system: Secondary | ICD-10-CM

## 2019-09-07 DIAGNOSIS — F329 Major depressive disorder, single episode, unspecified: Secondary | ICD-10-CM | POA: Diagnosis present

## 2019-09-07 DIAGNOSIS — R41843 Psychomotor deficit: Secondary | ICD-10-CM | POA: Diagnosis present

## 2019-09-07 DIAGNOSIS — Z915 Personal history of self-harm: Secondary | ICD-10-CM | POA: Diagnosis not present

## 2019-09-07 LAB — RESPIRATORY PANEL BY RT PCR (FLU A&B, COVID)
Influenza A by PCR: NEGATIVE
Influenza B by PCR: NEGATIVE
SARS Coronavirus 2 by RT PCR: NEGATIVE

## 2019-09-07 LAB — HEMOGLOBIN A1C
Hgb A1c MFr Bld: 6.2 % — ABNORMAL HIGH (ref 4.8–5.6)
Mean Plasma Glucose: 131.24 mg/dL

## 2019-09-07 LAB — CBC WITH DIFFERENTIAL/PLATELET
Abs Immature Granulocytes: 0.03 10*3/uL (ref 0.00–0.07)
Basophils Absolute: 0 10*3/uL (ref 0.0–0.1)
Basophils Relative: 0 %
Eosinophils Absolute: 0.2 10*3/uL (ref 0.0–0.5)
Eosinophils Relative: 3 %
HCT: 40 % (ref 39.0–52.0)
Hemoglobin: 12.7 g/dL — ABNORMAL LOW (ref 13.0–17.0)
Immature Granulocytes: 1 %
Lymphocytes Relative: 36 %
Lymphs Abs: 2.1 10*3/uL (ref 0.7–4.0)
MCH: 28.7 pg (ref 26.0–34.0)
MCHC: 31.8 g/dL (ref 30.0–36.0)
MCV: 90.5 fL (ref 80.0–100.0)
Monocytes Absolute: 0.5 10*3/uL (ref 0.1–1.0)
Monocytes Relative: 8 %
Neutro Abs: 3 10*3/uL (ref 1.7–7.7)
Neutrophils Relative %: 52 %
Platelets: 220 10*3/uL (ref 150–400)
RBC: 4.42 MIL/uL (ref 4.22–5.81)
RDW: 12.6 % (ref 11.5–15.5)
WBC: 5.7 10*3/uL (ref 4.0–10.5)
nRBC: 0 % (ref 0.0–0.2)

## 2019-09-07 LAB — COMPREHENSIVE METABOLIC PANEL
ALT: 28 U/L (ref 0–44)
AST: 21 U/L (ref 15–41)
Albumin: 4.3 g/dL (ref 3.5–5.0)
Alkaline Phosphatase: 42 U/L (ref 38–126)
Anion gap: 8 (ref 5–15)
BUN: 15 mg/dL (ref 6–20)
CO2: 24 mmol/L (ref 22–32)
Calcium: 9.1 mg/dL (ref 8.9–10.3)
Chloride: 110 mmol/L (ref 98–111)
Creatinine, Ser: 0.76 mg/dL (ref 0.61–1.24)
GFR calc Af Amer: >60 mL/min (ref >60)
GFR calc non Af Amer: >60 mL/min (ref >60)
Glucose, Bld: 149 mg/dL — ABNORMAL HIGH (ref 70–99)
Potassium: 4 mmol/L (ref 3.5–5.1)
Sodium: 142 mmol/L (ref 135–145)
Total Bilirubin: 0.5 mg/dL (ref 0.3–1.2)
Total Protein: 6.6 g/dL (ref 6.5–8.1)

## 2019-09-07 LAB — TSH: TSH: 1.893 u[IU]/mL (ref 0.350–4.500)

## 2019-09-07 MED ORDER — CLONIDINE HCL 0.1 MG PO TABS
0.2000 mg | ORAL_TABLET | Freq: Three times a day (TID) | ORAL | Status: DC | PRN
  Administered 2019-09-07: 0.2 mg via ORAL
  Filled 2019-09-07: qty 2

## 2019-09-07 MED ORDER — METFORMIN HCL 500 MG PO TABS
500.0000 mg | ORAL_TABLET | Freq: Two times a day (BID) | ORAL | Status: DC
  Administered 2019-09-07 – 2019-09-11 (×8): 500 mg via ORAL
  Filled 2019-09-07 (×13): qty 1

## 2019-09-07 MED ORDER — TICAGRELOR 90 MG PO TABS
90.0000 mg | ORAL_TABLET | Freq: Two times a day (BID) | ORAL | Status: DC
  Administered 2019-09-07 – 2019-09-11 (×8): 90 mg via ORAL
  Filled 2019-09-07 (×12): qty 1

## 2019-09-07 MED ORDER — ASPIRIN 81 MG PO TBEC
81.0000 mg | DELAYED_RELEASE_TABLET | Freq: Every day | ORAL | Status: DC
  Administered 2019-09-07 – 2019-09-11 (×5): 81 mg via ORAL
  Filled 2019-09-07 (×8): qty 1

## 2019-09-07 MED ORDER — ISOSORBIDE MONONITRATE ER 60 MG PO TB24
60.0000 mg | ORAL_TABLET | Freq: Every day | ORAL | Status: DC
  Administered 2019-09-07 – 2019-09-11 (×5): 60 mg via ORAL
  Filled 2019-09-07 (×7): qty 1

## 2019-09-07 MED ORDER — AMLODIPINE BESYLATE 10 MG PO TABS
10.0000 mg | ORAL_TABLET | Freq: Every day | ORAL | Status: DC
  Administered 2019-09-07 – 2019-09-11 (×5): 10 mg via ORAL
  Filled 2019-09-07 (×5): qty 1
  Filled 2019-09-07: qty 2
  Filled 2019-09-07: qty 1
  Filled 2019-09-07: qty 2

## 2019-09-07 MED ORDER — FAMOTIDINE 20 MG PO TABS
20.0000 mg | ORAL_TABLET | Freq: Two times a day (BID) | ORAL | Status: DC
  Administered 2019-09-07 – 2019-09-11 (×8): 20 mg via ORAL
  Filled 2019-09-07 (×8): qty 1
  Filled 2019-09-07: qty 2
  Filled 2019-09-07 (×3): qty 1

## 2019-09-07 MED ORDER — TRAZODONE HCL 150 MG PO TABS
150.0000 mg | ORAL_TABLET | Freq: Every evening | ORAL | Status: DC | PRN
  Administered 2019-09-07: 150 mg via ORAL
  Filled 2019-09-07: qty 1

## 2019-09-07 MED ORDER — NITROGLYCERIN 0.4 MG SL SUBL
0.4000 mg | SUBLINGUAL_TABLET | SUBLINGUAL | Status: DC | PRN
  Filled 2019-09-07: qty 25

## 2019-09-07 MED ORDER — VORTIOXETINE HBR 10 MG PO TABS
10.0000 mg | ORAL_TABLET | Freq: Every day | ORAL | Status: DC
  Administered 2019-09-07 – 2019-09-08 (×2): 10 mg via ORAL
  Filled 2019-09-07 (×4): qty 1

## 2019-09-07 MED ORDER — PRENATAL MULTIVITAMIN CH
1.0000 | ORAL_TABLET | Freq: Every day | ORAL | Status: DC
  Administered 2019-09-07 – 2019-09-11 (×5): 1 via ORAL
  Filled 2019-09-07 (×8): qty 1

## 2019-09-07 NOTE — Progress Notes (Signed)
Patient ID: [name], male   DOB: [birth year], 59 y.o.   MRN: [id number]   Long NOVEL CORONAVIRUS (COVID-19) DAILY CHECK-OFF SYMPTOMS - answer yes or no to each - every day NO YES  Have you had a fever in the past 24 hours?  . Fever (Temp > 37.80C / 100F) X   Have you had any of these symptoms in the past 24 hours? . New Cough .  Sore Throat  .  Shortness of Breath .  Difficulty Breathing .  Unexplained Body Aches   X   Have you had any one of these symptoms in the past 24 hours not related to allergies?   . Runny Nose .  Nasal Congestion .  Sneezing   X   If you have had runny nose, nasal congestion, sneezing in the past 24 hours, has it worsened?  X   EXPOSURES - check yes or no X   Have you traveled outside the state in the past 14 days?  X   Have you been in contact with someone with a confirmed diagnosis of COVID-19 or PUI in the past 14 days without wearing appropriate PPE?  X   Have you been living in the same home as a person with confirmed diagnosis of COVID-19 or a PUI (household contact)?    X   Have you been diagnosed with COVID-19?    X              What to do next: Answered NO to all: Answered YES to anything:   Proceed with unit schedule Follow the [facility] Inpatient Flowsheet.

## 2019-09-07 NOTE — Progress Notes (Deleted)
   09/07/19 2300  Psych Admission Type (Psych Patients Only)  Admission Status Voluntary  Psychosocial Assessment  Patient Complaints Worrying  Eye Contact Brief  Facial Expression Flat;Sullen  Affect Flat;Sullen  Speech Logical/coherent  Interaction Minimal  Motor Activity Other (Comment) (WNL)  Appearance/Hygiene Body odor;Poor hygiene  Behavior Characteristics Cooperative  Mood Depressed  Thought Process  Coherency WDL  Content WDL  Delusions None reported or observed  Perception WDL  Hallucination None reported or observed  Judgment WDL  Confusion None  Danger to Self  Current suicidal ideation? Denies  Danger to Others  Danger to Others None reported or observed

## 2019-09-07 NOTE — [facility] Suicide Risk Assessment (Signed)
[place] Admission Suicide Risk Assessment   Nursing information obtained from:  Patient Demographic factors:  Male Current Mental Status:  Suicidal ideation indicated by patient, Self-harm thoughts Loss Factors:  Decline in physical health Historical Factors:  NA Risk Reduction Factors:  Positive therapeutic relationship  Total Time spent with patient: 45 minutes Principal Problem: <principal problem not specified> Diagnosis:  Active Problems:   MDD (major depressive disorder), single episode, severe with psychotic features ([place])  Subjective Data: Latest of multiple admissions due to depression  Continued Clinical Symptoms:    The "Alcohol Use Disorders Identification Test", Guidelines for Use in Primary Care, Second Edition.  World Pharmacologist [facility]). Score between 0-7:  no or low risk or alcohol related problems. Score between 8-15:  moderate risk of alcohol related problems. Score between 16-19:  high risk of alcohol related problems. Score 20 or above:  warrants further diagnostic evaluation for alcohol dependence and treatment.   CLINICAL FACTORS:   Depression:   Anhedonia Hopelessness  Musculoskeletal: Strength & Muscle Tone: within normal limits Gait & Station: normal Patient leans: N/A  Psychiatric Specialty Exam: Physical Exam  Nursing note and vitals reviewed. Constitutional: He appears well-developed and well-nourished.  Cardiovascular: Normal rate and regular rhythm.    Review of Systems  Constitutional: Negative.   HENT: Negative.   Eyes: Negative.   Respiratory: Negative.   Cardiovascular: Negative.   Endocrine: Negative.   Genitourinary: Negative.   Neurological: Negative.   Essential hypertension  Blood pressure (!) 121/101, pulse 91, temperature 98 F (36.7 C), temperature source Oral, resp. rate 16, SpO2 100 %.There is no height or weight on file to calculate BMI.  General Appearance: Casual  Eye Contact:  Good  Speech:  Clear and Coherent   Volume:  Normal  Mood:  Depressed  Affect:  Appropriate  Thought Process:  Coherent, Goal Directed and Descriptions of Associations: Intact  Orientation:  Full (Time, Place, and Person)  Thought Content:  Logical  Suicidal Thoughts:  Yes.  without intent/plan  Homicidal Thoughts:  No  Memory:  Immediate;   Fair Recent;   Fair Remote;   Fair  Judgement:  Fair  Insight:  Fair  Psychomotor Activity:  Normal  Concentration:  Concentration: Fair and Attention Span: Fair  Recall:  [employer] of Knowledge:  Fair  Language:  Fair  Akathisia:  Negative  Handed:  Right  AIMS (if indicated):     Assets:  Desire for Improvement Leisure Time Physical Health Resilience  ADL's:  Intact  Cognition:  WNL  Sleep:       COGNITIVE FEATURES THAT CONTRIBUTE TO RISK:  None    SUICIDE RISK:   Moderate:  Frequent suicidal ideation with limited intensity, and duration, some specificity in terms of plans, no associated intent, good self-control, limited dysphoria/symptomatology, some risk factors present, and identifiable protective factors, including available and accessible social support.  PLAN OF CARE: see eval   I certify that inpatient services furnished can reasonably be expected to improve the patient's condition.   Johnn Hai, MD 09/07/2019, 1:47 PM

## 2019-09-07 NOTE — BH Assessment (Signed)
Assessment Note  [name] is a 59 y.o. male walk-in at [facility] seeking treatment due to increased depression and suicidal ideations.  Pt states, "I need to get back on my medication for clinical depression.  I stopped taking my medicine 4 months ago and lately I have gotten to the point where I don't want to take any of my medications.  I have episodes of not being able to do anything and I don't want to get out of bed."  Pt reports having a history of multiple suicide attempts.  Pt states, "I tried to kill myself 2 times. I drove my car off the bridge and I took a lot of medicine at one time."  Pt admits to being an alcoholic.  Pt states, "I haven't had a drink of alcohol since Aug 2020."  Pt could not deny or confirm if he was having HI but hung his head down when asked if he was having thoughts of wanting to hurt others.  Pt denies A/V-hallucinations.   Pt is a disabled widow who has resided alone since May 2019.  Pt has a history of multiple hospitalizations due to MH/SA treatment at different facilities.  Pt is currently being treated outpatient at [facility] for counseling monthly.  Pt is not longer receives medication management at [facility] because he refuse to take the medication.    Patient was wearing dark clothes and smelled as if he had not showered.  Pt was alert throughout the assessment.  Patient made poor eye contact and had normal psychomotor activity.  Patient spoke in a normal voice without pressured speech.  Pt expressed feeling sad.  Pt's affect appeared dysphoric and congruent with stated mood. Pt's thought process was coherent and logical.  Pt presented with partial insight and judgement.  Pt did not appear to be responding to internal stimuli.  Pt was not able to contract for safety.   Disposition: [facility] discussed case with [place] Provider, Mordecai Maes, NP who recommends inpatient treatment.  TTS will look for inpatient placement.  Diagnosis: F33.2  Major Depressive Disorder  Severe Recurrent Episode  Past Medical History:  Past Medical History:  Diagnosis Date  . Allergies   . Arthritis   . Chest pain   . Chronic lower back pain   . Chronic pain of right wrist   . Coronary artery disease    a. Multiple prior caths/PCI. Cath 2013 with possible spasm of RCA, 70% ISR of mid LCx with subsequent DES to mLCx and prox LCX. b. H/o microvascular angina. c. Recurrent angina 08/2014 - s/p PTCA/DES to prox Cx, PTCA/CBA to OM1.  c. LHC 06/10/15 with patent stents and some ISR in LCX and OM-1 that was not flow limiting --> Rx   . Dyslipidemia    a. Intolerant to many statins except tolerating Livalo.  Marland Kitchen GERD (gastroesophageal reflux disease)   . H/O cardiac catheterization 10/25/2018  . Hypertension   . Myocardial infarction ([place]) ~ 2010  . S/P angioplasty with stent, DES, to proximal and mid LCX 12/15/11 12/15/2011  . Shoulder pain   . Stroke [facility])    pt. reports had a stroke around time of MI 2010  . Type II diabetes mellitus ([place])   . Unstable angina [facility])     Past Surgical History:  Procedure Laterality Date  . CARDIAC CATHETERIZATION  06/15/2002   LAD with prox 40% stenosis, norma L main, Cfx with 25% lesion, RCA with long mid 25% stenosis (Dr. Vita Barley)  .  CARDIAC CATHETERIZATION  04/01/2010   normal L main, LAD wit mild stenosis, L Cfx with 70% in-stent restenosis, RCA with 70% in-stent restenosis, LVEF >60% (Dr. K. Mali Hilty) - cutting ballon arthrectomy to RCA & Cfx (Dr. Rockne Menghini)  . CARDIAC CATHETERIZATION  08/25/2010   preserved global LV contractility; multivessel CAD, diffuse 90-95% in-stent restenosis in prox placed Cfx stent - cutting balloon arthrectomy in Cfx with multiple dilatations 90-95% to 0% stenosis (Dr. Corky Downs)  . CARDIAC CATHETERIZATION  01/26/2011   PCI & stenting of aggresive in-stent restenosis within previously stented AV groove Cfx with 3.0x14mm Taxus DES (previous stents were Promus) (Dr. Adora Fridge)  . CARDIAC CATHETERIZATION   05/11/2011   preserved LV function, 40% mid LAD stenosis, 30-40% narrowing proximal to stented semgnet of prox Cfx, patent mid RCA stent with smooth 20% narrowing in distal RCA (Dr. Corky Downs)  . CARDIAC CATHETERIZATION  12/15/2011   PCI & stenting of proximal & mid Cfx with DES - 3.0x63mm in proximal, 3.0x73mm in mid (Dr. Adora Fridge)  . CARDIAC CATHETERIZATION N/A 06/10/2015   Procedure: Left Heart Cath and Coronary Angiography;  Surgeon: Jolaine Artist, MD;  Location: [facility];  Service: Cardiovascular;  Laterality: N/A;  . CARDIAC CATHETERIZATION  10/25/2018  . cardiometabolic testing  123456   good exercise effort, peak VO2 79% predicted with normal VO2 HR curves (mild deconditioning)  . COLONOSCOPY  12/2012   diminutive hyperplastic sigmoid poyp so repeat routine 2024  . CORONARY BALLOON ANGIOPLASTY N/A 10/25/2018   Procedure: CORONARY BALLOON ANGIOPLASTY;  Surgeon: Jettie Booze, MD;  Location: [place] [facility];  Service: Cardiovascular;  Laterality: N/A;  . EXCISIONAL HEMORRHOIDECTOMY  1984  . LEFT HEART CATH AND CORONARY ANGIOGRAPHY N/A 10/25/2018   Procedure: LEFT HEART CATH AND CORONARY ANGIOGRAPHY;  Surgeon: Jettie Booze, MD;  Location: [place] [facility];  Service: Cardiovascular;  Laterality: N/A;  . LEFT HEART CATHETERIZATION WITH CORONARY ANGIOGRAM N/A 05/11/2011   Procedure: LEFT HEART CATHETERIZATION WITH CORONARY ANGIOGRAM;  Surgeon: Troy Sine, MD;  Location: [facility] C[facility] LAB;  Service: Cardiovascular;  Laterality: N/A;  Possible percutaneous coronary intervention, possible IVUS  . LEFT HEART CATHETERIZATION WITH CORONARY ANGIOGRAM N/A 12/15/2011   Procedure: LEFT HEART CATHETERIZATION WITH CORONARY ANGIOGRAM;  Surgeon: Lorretta Harp, MD;  Location: [facility] CATH LAB;  Service: Cardiovascular;  Laterality: N/A;  . LEFT HEART CATHETERIZATION WITH CORONARY ANGIOGRAM N/A 09/05/2014   Procedure: LEFT HEART CATHETERIZATION WITH CORONARY ANGIOGRAM;  Surgeon:  Burnell Blanks, MD;  Location: [facility] CATH LAB;  Service: Cardiovascular;  Laterality: N/A;  . LIPOMA EXCISION     back of the head  . NM MYOCAR PERF WALL MOTION  02/2012   lexiscan myoview; mild perfusion defect in mid inferolateral & basal inferolateral region (infarct/scar); EF 52%, abnormal but ow risk scan  . PERCUTANEOUS CORONARY STENT INTERVENTION (PCI-S)  09/05/2014   Procedure: PERCUTANEOUS CORONARY STENT INTERVENTION (PCI-S);  Surgeon: Burnell Blanks, MD;  Location: [facility] CATH LAB;  Service: Cardiovascular;;    Family History:  Family History  Problem Relation Age of Onset  . Leukemia Mother   . Prostate cancer Father   . Coronary artery disease Paternal Grandmother   . Cancer Paternal Grandfather   . Cancer Brother     Social History:  reports that he quit smoking about 11 months ago. His smoking use included cigarettes. He has a 10.00 pack-year smoking history. He has never used smokeless tobacco. He reports current alcohol  use. He reports that he does not use drugs.  Additional Social History:  Alcohol / Drug Use Pain Medications: See MARs Prescriptions: See MARs Over the Counter: See MARs History of alcohol / drug use?: Yes Substance #1 Name of Substance 1: Alcohol 1 - Age of First Use: unknown 1 - Amount (size/oz): unknown 1 - Frequency: unknown 1 - Duration: unknown 1 - Last Use / Amount: August 2020  CIWA: CIWA-Ar BP: (!) 121/101 Pulse Rate: 91 COWS:    Allergies:  Allergies  Allergen Reactions  . Bee Venom Anaphylaxis and Hives  . Shellfish Allergy Anaphylaxis and Hives  . Statins Other (See Comments)    Myalgias. Tolerating livalo.   . Testosterone Cypionate     Testerone Injection --Increased breast tissue     Home Medications: (Not in a hospital admission)   OB/GYN Status:  No LMP for male patient.  General Assessment Data Location of Assessment: [facility] Assessment Services TTS Assessment: In system Is this a Tele or Face-to-Face  Assessment?: Face-to-Face Is this an Initial Assessment or a Re-assessment for this encounter?: Initial Assessment Patient Accompanied by:: N/A Language Other than English: No Living Arrangements: Other (Comment)(Pt lives alone since May 2019) What gender do you identify as?: Male Marital status: Widowed Living Arrangements: Alone Can pt return to current living arrangement?: Yes Admission Status: Voluntary Is patient capable of signing voluntary admission?: Yes Referral Source: Self/Family/Friend  Medical Screening Exam ([place]) Medical Exam completed: Yes  Crisis Care Plan Living Arrangements: Alone Legal Guardian: Other:(Self) Name of Therapist: [facility](monthly visits)  Education Status Is patient currently in school?: No Is the patient employed, unemployed or receiving disability?: Receiving disability income  Risk to self with the past 6 months Suicidal Ideation: Yes-Currently Present Has patient been a risk to self within the past 6 months prior to admission? : No Suicidal Intent: Yes-Currently Present Has patient had any suicidal intent within the past 6 months prior to admission? : No Is patient at risk for suicide?: Yes Suicidal Plan?: No Has patient had any suicidal plan within the past 6 months prior to admission? : No Access to Means: No What has been your use of drugs/alcohol within the last 12 months?: Alcohol Previous Attempts/Gestures: Yes How many times?: 2 Other Self Harm Risks: Driving off bridge and overdose Triggers for Past Attempts: Unknown Intentional Self Injurious Behavior: None Family Suicide History: Unknown Recent stressful life event(s): Other (Comment) Persecutory voices/beliefs?: No Depression: Yes Depression Symptoms: Despondent, Isolating, Guilt, Loss of interest in usual pleasures, Feeling worthless/self pity Substance abuse history and/or treatment for substance abuse?: Yes Suicide prevention information given to non-admitted  patients: Not applicable  Risk to Others within the past 6 months Homicidal Ideation: No-Not Currently/Within Last 6 Months Does patient have any lifetime risk of violence toward others beyond the six months prior to admission? : No Thoughts of Harm to Others: No-Not Currently Present/Within Last 6 Months Current Homicidal Intent: No Current Homicidal Plan: No Access to Homicidal Means: No History of harm to others?: No Assessment of Violence: None Noted Violent Behavior Description: None Does patient have access to weapons?: No Criminal Charges Pending?: No Does patient have a court date: No Is patient on probation?: No  Psychosis Hallucinations: None noted Delusions: None noted  Mental Status Report Appearance/Hygiene: Body odor, Poor hygiene Eye Contact: Fair Motor Activity: Freedom of movement Speech: Logical/coherent Level of Consciousness: Alert, Quiet/awake Mood: Depressed, Helpless, Sad Affect: Appropriate to circumstance, Depressed Anxiety Level: None Thought Processes: Coherent, Relevant Judgement:  Partial Orientation: Person, Place, Time, Appropriate for developmental age Obsessive Compulsive Thoughts/Behaviors: None  Cognitive Functioning Concentration: Normal Memory: Recent Intact, Remote Intact Is patient IDD: No Insight: Fair Impulse Control: Fair Appetite: Fair Have you had any weight changes? : No Change Sleep: Increased Vegetative Symptoms: Staying in bed, Not bathing, Decreased grooming  ADLScreening [facility] Assessment Services) Patient's cognitive ability adequate to safely complete daily activities?: Yes Patient able to express need for assistance with ADLs?: Yes Independently performs ADLs?: Yes (appropriate for developmental age)  Prior Inpatient Therapy Prior Inpatient Therapy: Yes Prior Therapy Dates: multiple Prior Therapy Facilty/Provider(s): multiple Reason for Treatment: MH/SA  Prior Outpatient Therapy Prior Outpatient Therapy:  Yes Prior Therapy Dates: ongoing Prior Therapy Facilty/Provider(s): [facility] Reason for Treatment: MH/SA Does patient have an ACCT team?: No Does patient have Intensive In-House Services?  : No Does patient have [facility] services? : Yes Does patient have P4CC services?: No  ADL Screening (condition at time of admission) Patient's cognitive ability adequate to safely complete daily activities?: Yes Is the patient deaf or have difficulty hearing?: No Does the patient have difficulty seeing, even when wearing glasses/contacts?: No Does the patient have difficulty concentrating, remembering, or making decisions?: No Patient able to express need for assistance with ADLs?: Yes Does the patient have difficulty dressing or bathing?: No Independently performs ADLs?: Yes (appropriate for developmental age) Does the patient have difficulty walking or climbing stairs?: No Weakness of Legs: None Weakness of Arms/Hands: None  Home Assistive Devices/Equipment Home Assistive Devices/Equipment: None    Abuse/Neglect Assessment (Assessment to be complete while patient is alone) Abuse/Neglect Assessment Can Be Completed: Unable to assess, patient is non-responsive or altered mental status(pt was too depressed to answer questions)     Advance Directives (For Healthcare) Does Patient Have a Medical Advance Directive?: No Would patient like information on creating a medical advance directive?: No - Patient declined Nutrition Screen- [place] Adult/WL/AP Patient's home diet: NPO        Disposition: [facility] discussed case with [place] Provider, Mordecai Maes, NP who recommends inpatient treatment.  TTS will look for inpatient placement.  Disposition Initial Assessment Completed for this Encounter: Yes(Per Mordecai Maes, NP) Disposition of Patient: Admit Type of inpatient treatment program: Adult Patient refused recommended treatment: No Mode of transportation if patient is discharged/movement?: N/A  On  Site Evaluation by:  Zayven Powe L. Rilea Arutyunyan, MS, [facility], [place] Reviewed with Physician:  Mordecai Maes, NP  Sylvester Harder, MS, [facility], [place] 09/07/2019 10:02 AM

## 2019-09-07 NOTE — Progress Notes (Signed)
Patient ID: [name], male   DOB: 1961/04/23, 59 y.o.   MRN: [id number]   D: Pt alert and oriented during [facility] admission process. Pt denies SI/HI, A/VH, and any pain.Pt is cooperative.  "History of Present Illness:  Mr. Blauser was seen on the 200 ward/observation [place], and it was determined that he could benefit from inpatient stabilization.   This is a repeat admission for him, last discharge 6 months ago on 01/16/2019 with a similar presentation upon that admission  On this presentation, he reported an exacerbation and multiple depressive symptoms, suicidal thoughts, and 2 prior attempts at suicide.  He had been off of medication approximately 4 months and believes he needs to get back on his antidepressant medication.  The patient is disabled and widowed, he has a local address, he reports his self-care has deteriorated.  He is cordial alert oriented and cooperative with thoughts of wanting to die without a specific plan can contract for safety here only.  Denies auditory or visual hallucinations, denies substance abuse described himself as an alcoholic in recovery."  A: Education, support, reassurance, and encouragement provided, q15 minute safety checks initiated. Pt's belongings in locker # 7.    R: Pt denies any concerns at this time, and verbally contracts for safety. Pt ambulating on the unit with no issues. Pt remains safe on the unit.
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
## 2019-09-07 NOTE — Progress Notes (Signed)
   09/07/19 2300  Psych Admission Type (Psych Patients Only)  Admission Status Voluntary  Psychosocial Assessment  Patient Complaints Worrying  Eye Contact Brief  Facial Expression Flat;Sullen  Affect Flat;Sullen  Speech Logical/coherent  Interaction Minimal  Motor Activity Other (Comment) (WNL)  Appearance/Hygiene Body odor;Poor hygiene  Behavior Characteristics Cooperative  Mood Depressed  Thought Process  Coherency WDL  Content WDL  Delusions None reported or observed  Perception WDL  Hallucination None reported or observed  Judgment WDL  Confusion None  Danger to Self  Current suicidal ideation? Passive  Self-Injurious Behavior Some self-injurious ideation observed or expressed.  No lethal plan expressed   Agreement Not to Harm Self Yes  Description of Agreement verbal contract for safety  Danger to Others  Danger to Others None reported or observed

## 2019-09-07 NOTE — Progress Notes (Signed)
Psychoeducational Group Note  Date:  09/07/2019 Time: 2030 Group Topic/Focus:  wrap up group  Participation Level: Did Not Attend  Participation Quality:  Not Applicable  Affect:  Not Applicable  Cognitive:  Not Applicable  Insight:  Not Applicable  Engagement in Group: Not Applicable  Additional Comments:Pt was newly admitted and asleep during group time.  [name] 09/07/2019, 10:26 PM

## 2019-09-07 NOTE — H&P (Signed)
Psychiatric Admission Assessment Adult  Patient Identification: [name] MRN:  [id number] Date of Evaluation:  09/07/2019 Chief Complaint:  Major Depressive Disorder Severe recurent Episode Principal Diagnosis: MDD Diagnosis:  Active Problems:   MDD (major depressive disorder), single episode, severe with psychotic features ([place])  History of Present Illness:   [name] was seen on the 200 ward/observation [place], and it was determined that he could benefit from inpatient stabilization.   This is a repeat admission for him, last discharge 6 months ago on 01/16/2019 with a similar presentation upon that admission  On this presentation, he reported an exacerbation and multiple depressive symptoms, suicidal thoughts, and 2 prior attempts at suicide.  He had been off of medication approximately 4 months and believes he needs to get back on his antidepressant medication.  The patient is disabled and widowed, he has a local address, he reports his self-care has deteriorated.  He is cordial alert oriented and cooperative with thoughts of wanting to die without a specific plan can contract for safety here only.  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 male walk-in at [facility] seeking treatment due to increased depression and suicidal ideations.  Pt states, "I need to get back on my medication for clinical depression.  I stopped taking my medicine 4 months ago and lately I have gotten to the point where I don't want to take any of my medications.  I have episodes of not being able to do anything and I don't want to get out of bed."  Pt reports having a history of multiple suicide attempts.  Pt states, "I tried to kill myself 2 times. I drove my car off the bridge and I took a lot of medicine at one  time."  Pt admits to being an alcoholic.  Pt states, "I haven't had a drink of alcohol since Aug 2020."  Pt could not deny or confirm if he was having HI but hung his head down when asked if he was having thoughts of wanting to hurt others.  Pt denies A/V-hallucinations.   Pt is a disabled widow who has resided alone since May 2019.  Pt has a history of multiple hospitalizations due to MH/SA treatment at different facilities.  Pt is currently being treated outpatient at [facility] for counseling monthly.  Pt is not longer receives medication management at [facility] because he refuse to take the medication.    Patient was wearing dark clothes and smelled as if he had not showered.  Pt was alert throughout the assessment.  Patient made poor eye contact and had normal psychomotor activity.  Patient spoke in a normal voice without pressured speech.  Pt expressed feeling sad.  Pt's affect appeared dysphoric and congruent with stated mood. Pt's thought process was coherent and logical.  Pt presented with partial insight and judgement.  Pt did not appear to be responding to internal stimuli.  Pt was not able to contract for safety.  Associated Signs/Symptoms: Depression Symptoms:  depressed mood, anhedonia, insomnia, psychomotor retardation, fatigue, feelings of worthlessness/guilt, difficulty concentrating, hopelessness, suicidal thoughts without plan, (Hypo) Manic Symptoms:  n/a Anxiety Symptoms:  Excessive Worry, Psychotic Symptoms:  none PTSD Symptoms: NA Total Time spent with patient: 45 minutes  Past Psychiatric History:  Patient7/20/20 reads as follows This is a  repeat admission for Mr. [name] well-known to the service last here in February, once again he presents with depression, suicidal thoughts, anhedonia, apathy, and as discussed below he felt it best to receive treatment. He lives at home alone he states he has been drinking but does not believe he needs a full detox regimen. He has been  generally sober with some relapses intermittently but he is vague about the amounts. Has a history of hypertension type 2 diabetes history of MI he does not believe he has post MI depression in the past just recurrent and severe depression independent of that. He does not have auditory or visual hallucinations. Despite recent suicidal thoughts can contract for safety here  According to our assessment team note of yesterday  [name] an 59 y.o.malewho presents voluntarily to [facility] for a walk-in assessment. Pt isreporting symptoms of depression and while he doesn't want to state he is havingsuicidalthoughts, he admits "it wouldn't be a bad idea". Pt has a history ofMajor Depressive Disorder dx with 2 past suicide attempts. Pt states he was encouraged by his sister, who is a Social worker, to come for assessment.When pt stated he couldn't leave his apartment in such an untidy state, sister came over to help clean. Pt states his sister was surprised at the state of pt's home given he is usually neat.  Pt reportsintermittentmedicationcompliance. He states he has all his medications, just isn't motivated to take them lately.Pt reportspastsuicideattempts included an overdose on meds & intentionally driving his car off a bridge.Pt reports he owns a gun. Pt has medical dx of Type 2 Diabetes, chronic pain &heart condition. Pt states he knows he is self-harming when he isn't careful with his diet. He states he has gained 20 lbs from eating sugary & fatty foods over the past few months.  Pt acknowledgesmultiplesymptomsof Depression.He reports vaguehomicidal ideation towards a friend from [place] who crossed him. Pt denies homicidal plan. Ptdenies AVH &other psychotic symptoms. Pt states current stressors include loss of his routines with Covid quarantine. When pt stopped drinking alcohol, he became active in working out at the gym. He also regularly attended [place] meetings.Those habits  have been central to pt staying well. AA meeting have continued online, &pt attempted to remain active but didn't find it helpful.  Pt livesalone, and supports includehis sister, Jennelle Human [phone]. Pt gave verbal permission to speak with sister.Pt denies hxof abuse and trauma. Pt reports there is a family history of Depression and suicide. 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 Pt hasgoodinsight and fairjudgment. Pt's memory is intact.Pt has not current legalcharges or probation. ? [facility] but [facility]'t [facility]. [facility]. [facility].  Ptreports recent 2-day binges ofalcohol(about a gallon of Gin) about ever 3 weeks. Is the patient at risk to self? Yes.    Has the patient been a risk to self in the past 6 months? Yes.    Has the patient been a risk to self within the distant past? Yes.    Is the patient a risk to others? No.  Has the patient been a risk to others in the past 6 months? No.  Has the patient been a risk to others within the distant past? No.   Prior Inpatient Therapy: Prior Inpatient Therapy: Yes Prior Therapy Dates: multiple Prior Therapy Facilty/Provider(s): multiple Reason for Treatment: MH/SA Prior Outpatient Therapy: Prior Outpatient Therapy: Yes Prior Therapy Dates: ongoing Prior Therapy Facilty/Provider([facility]): [facility]  Reason for Treatment: MH/SA Does patient have an ACCT team?: No Does patient have Intensive In-House Services?  : No Does patient have [facility] services? : Yes Does patient have [facility] services?: No  Alcohol Screening:   Substance Abuse History in the last 12 months:  Yes.   Consequences of Substance Abuse: NA Previous Psychotropic Medications: Yes  Psychological Evaluations: No  Past Medical History:  Past Medical History:  Diagnosis Date  . Allergies   . Arthritis   . Chest pain   . Chronic lower back pain   . Chronic pain of  right wrist   . Coronary artery disease    a. Multiple prior caths/PCI. Cath 2013 with possible spasm of RCA, 70% ISR of mid LCx with subsequent DES to mLCx and prox LCX. b. H/o microvascular angina. c. Recurrent angina 08/2014 - s/p PTCA/DES to prox Cx, PTCA/CBA to OM1.  c. LHC 06/10/15 with patent stents and some ISR in LCX and OM-1 that was not flow limiting --> Rx   . Dyslipidemia    a. Intolerant to many statins except tolerating Livalo.  Marland Kitchen GERD (gastroesophageal reflux disease)   . H/O cardiac catheterization 10/25/2018  . Hypertension   . Myocardial infarction ([place]) ~ 2010  . S/P angioplasty with stent, DES, to proximal and mid LCX 12/15/11 12/15/2011  . Shoulder pain   . Stroke [facility])    pt. reports had a stroke around time of MI 2010  . Type II diabetes mellitus ([place])   . Unstable angina [facility])     Past Surgical History:  Procedure Laterality Date  . CARDIAC CATHETERIZATION  06/15/2002   LAD with prox 40% stenosis, norma L main, Cfx with 25% lesion, RCA with long mid 25% stenosis (Dr. Vita Barley)  . CARDIAC CATHETERIZATION  04/01/2010   normal L main, LAD wit mild stenosis, L Cfx with 70% in-stent restenosis, RCA with 70% in-stent restenosis, LVEF >60% (Dr. K. Mali Hilty) - cutting ballon arthrectomy to RCA & Cfx (Dr. Rockne Menghini)  . CARDIAC CATHETERIZATION  08/25/2010   preserved global LV contractility; multivessel CAD, diffuse 90-95% in-stent restenosis in prox placed Cfx stent - cutting balloon arthrectomy in Cfx with multiple dilatations 90-95% to 0% stenosis (Dr. Corky Downs)  . CARDIAC CATHETERIZATION  01/26/2011   PCI & stenting of aggresive in-stent restenosis within previously stented AV groove Cfx with 3.0x52mm Taxus DES (previous stents were Promus) (Dr. Adora Fridge)  . CARDIAC CATHETERIZATION  05/11/2011   preserved LV function, 40% mid LAD stenosis, 30-40% narrowing proximal to stented semgnet of prox Cfx, patent mid RCA stent with smooth 20% narrowing in distal RCA (Dr. Corky Downs)   . CARDIAC CATHETERIZATION  12/15/2011   PCI & stenting of proximal & mid Cfx with DES - 3.0x33mm in proximal, 3.0x64mm in mid (Dr. Adora Fridge)  . CARDIAC CATHETERIZATION N/A 06/10/2015   Procedure: Left Heart Cath and Coronary Angiography;  Surgeon: Jolaine Artist, MD;  Location: [facility] CV LAB;  Service: Cardiovascular;  Laterality: N/A;  . CARDIAC CATHETERIZATION  10/25/2018  . cardiometabolic testing  123456   good exercise effort, peak VO2 79% predicted with normal VO2 HR curves (mild deconditioning)  . COLONOSCOPY  12/2012   diminutive hyperplastic sigmoid poyp so repeat routine 2024  . CORONARY BALLOON ANGIOPLASTY N/A 10/25/2018   Procedure: CORONARY BALLOON ANGIOPLASTY;  Surgeon: Jettie Booze, MD;  Location: [facility] CV LAB;  Service: Cardiovascular;  Laterality: N/A;  . EXCISIONAL HEMORRHOIDECTOMY  1984  . LEFT HEART CATH  AND CORONARY ANGIOGRAPHY N/A 10/25/2018   Procedure: LEFT HEART CATH AND CORONARY ANGIOGRAPHY;  Surgeon: Jettie Booze, MD;  Location: [facility] CV LAB;  Service: Cardiovascular;  Laterality: N/A;  . LEFT HEART CATHETERIZATION WITH CORONARY ANGIOGRAM N/A 05/11/2011   Procedure: LEFT HEART CATHETERIZATION WITH CORONARY ANGIOGRAM;  Surgeon: Troy Sine, MD;  Location[facility] C[facility] LAB;  Service: Cardiovascular;  Laterality: N/A;  Possible percutaneous coronary intervention, possible IVUS  . LEFT HEART CATHETERIZATION WITH CORONARY ANGIOGRAM N/A 12/15/2011   Procedure: LEFT HEART CATHETERIZATION WITH CORONARY ANGIOGRAM;  Surgeon: Lorretta Harp, MD;  Location: [facility] CATH LAB;  Service: Cardiovascular;  Laterality: N/A;  . LEFT HEART CATHETERIZATION WITH CORONARY ANGIOGRAM N/A 09/05/2014   Procedure: LEFT HEART CATHETERIZATION WITH CORONARY ANGIOGRAM;  Surgeon: Burnell Blanks, MD;  Location: [facility] CATH LAB;  Service: Cardiovascular;  Laterality: N/A;  . LIPOMA EXCISION     back of the head  . NM MYOCAR PERF WALL MOTION  02/2012   lexiscan  myoview; mild perfusion defect in mid inferolateral & basal inferolateral region (infarct/scar); EF 52%, abnormal but ow risk scan  . PERCUTANEOUS CORONARY STENT INTERVENTION (PCI-S)  09/05/2014   Procedure: PERCUTANEOUS CORONARY STENT INTERVENTION (PCI-S);  Surgeon: Burnell Blanks, MD;  Location: [facility] CATH LAB;  Service: Cardiovascular;;   Family History:  Family History  Problem Relation Age of Onset  . Leukemia Mother   . Prostate cancer Father   . Coronary artery disease Paternal Grandmother   . Cancer Paternal Grandfather   . Cancer Brother    Family Psychiatric  History: no new data Tobacco Screening:   Social History:  Social History   Substance and Sexual Activity  Alcohol Use Yes     Social History   Substance and Sexual Activity  Drug Use No   Comment: 12/15/11 "last cocaine was 2010"    Additional Social History: Marital status: Widowed    Pain Medications: See MARs Prescriptions: See MARs Over the Counter: See MARs History of alcohol / drug use?: Yes Name of Substance 1: Alcohol 1 - Age of First Use: unknown 1 - Amount (size/oz): unknown 1 - Frequency: unknown 1 - Duration: unknown 1 - Last Use / Amount: August 2020                  Allergies:   Allergies  Allergen Reactions  . Bee Venom Anaphylaxis and Hives  . Shellfish Allergy Anaphylaxis and Hives  . Statins Other (See Comments)    Myalgias. Tolerating livalo.   . Testosterone Cypionate     Testerone Injection --Increased breast tissue    Lab Results:  Results for orders placed or performed during the hospital encounter of 09/07/19 (from the past 48 hour(s))  Respiratory Panel by RT PCR (Flu A&B, Covid) -     Status: None   Collection Time: 09/07/19 11:03 AM  Result Value Ref Range   SARS Coronavirus 2 by RT PCR NEGATIVE NEGATIVE    Comment: (NOTE) SARS-CoV-2 target nucleic acids are NOT DETECTED. The SARS-CoV-2 RNA is generally detectable in upper respiratoy specimens during the  acute phase of infection. The lowest concentration of SARS-CoV-2 viral copies this assay can detect is 131 copies/mL. A negative result does not preclude SARS-Cov-2 infection and should not be used as the sole basis for treatment or other patient management decisions. A negative result may occur with  improper specimen collection/handling, submission of specimen other than nasopharyngeal swab, presence of viral mutation(s) within the areas targeted by this  assay, and inadequate number of viral copies (<131 copies/mL). A negative result must be combined with clinical observations, patient history, and epidemiological information. The expected result is Negative. Fact Sheet for Patients:  PinkCheek.be Fact Sheet for Healthcare Providers:  GravelBags.it This test is not yet ap proved or cleared by the [place] FDA and  has been authorized for detection and/or diagnosis of SARS-CoV-2 by FDA under an Emergency Use Authorization (EUA). This EUA will remain  in effect (meaning this test can be used) for the duration of the COVID-19 declaration under Section 564(b)(1) of the Act, 21 U.S.C. section 360bbb-3(b)(1), unless the authorization is terminated or revoked sooner.    Influenza A by PCR NEGATIVE NEGATIVE   Influenza B by PCR NEGATIVE NEGATIVE    Comment: (NOTE) The Xpert Xpress SARS-CoV-2/FLU/RSV assay is intended as an aid in  the diagnosis of influenza from Nasopharyngeal swab specimens and  should not be used as a sole basis for treatment. Nasal washings and  aspirates are unacceptable for Xpert Xpress SARS-CoV-2/FLU/RSV  testing. Fact Sheet for Patients: PinkCheek.be Fact Sheet for Healthcare Providers: GravelBags.it This test is not yet approved or cleared by the [place] FDA and  has been authorized for detection and/or diagnosis of SARS-CoV-2 by  FDA  under an Emergency Use Authorization (EUA). This EUA will remain  in effect (meaning this test can be used) for the duration of the  Covid-19 declaration under Section 564(b)(1) of the Act, 21  U.S.C. section 360bbb-3(b)(1), unless the authorization is  terminated or revoked. Performed at [facility], [place] [address] [place], [place] [address]     Blood Alcohol level:  Lab Results  Component Value Date   ETH <10 01/13/2019   ETH <10 99991111    Metabolic Disorder Labs:  Lab Results  Component Value Date   HGBA1C 6.4 (A) 07/05/2019   MPG 142.72 10/25/2018   MPG 125.5 10/03/2017   No results found for: PROLACTIN Lab Results  Component Value Date   CHOL 123 07/05/2019   TRIG 74 07/05/2019   HDL 46 07/05/2019   CHOLHDL 2.7 07/05/2019   VLDL 25 10/26/2018   LDLCALC 62 07/05/2019   LDLCALC 74 07/03/2019    Current Medications: Current Facility-Administered Medications  Medication Dose Route Frequency Provider Last Rate Last Admin  . amLODipine (NORVASC) tablet 10 mg  10 mg Oral Daily Sharma Covert, MD   10 mg at 09/07/19 1147  . aspirin EC tablet 81 mg  81 mg Oral Daily Sharma Covert, MD   81 mg at 09/07/19 1147  . famotidine (PEPCID) tablet 20 mg  20 mg Oral BID Sharma Covert, MD   20 mg at 09/07/19 1147  . isosorbide mononitrate (IMDUR) 24 hr tablet 60 mg  60 mg Oral Daily Sharma Covert, MD   60 mg at 09/07/19 1211  . metFORMIN (GLUCOPHAGE) tablet 500 mg  500 mg Oral BID WC Sharma Covert, MD      . nitroGLYCERIN (NITROSTAT) SL tablet 0.4 mg  0.4 mg Sublingual Q5 min PRN Sharma Covert, MD      . ticagrelor [facility]) tablet 90 mg  90 mg Oral BID Sharma Covert, MD       PTA Medications: Medications Prior to Admission  Medication Sig Dispense Refill Last Dose  . amLODipine (NORVASC) 10 MG tablet Take 1 tablet (10 mg total) by mouth daily. 90 tablet 1   . aspirin 81 MG chewable tablet Chew 1 tablet (  81 mg total) by  mouth daily. 30 tablet 3   . co-enzyme Q-10 30 MG capsule Take 30 mg by mouth daily.     . isosorbide mononitrate (IMDUR) 60 MG 24 hr tablet Take 1.5 tablets (90 mg total) by mouth daily. 135 tablet 3   . LIVALO 4 MG TABS TAKE 1 TABLET BY MOUTH ONCE DAILY IN THE MORNING (Patient taking differently: Take 1 tablet by mouth every morning. ) 30 tablet 3   . metFORMIN (GLUCOPHAGE) 500 MG tablet TAKE 1 TABLET BY MOUTH TWICE DAILY WITH A MEAL (Patient taking differently: Take 500 mg by mouth 2 (two) times daily with a meal. ) 180 tablet 0   . metoprolol tartrate (LOPRESSOR) 100 MG tablet Take 1 tablet by mouth twice daily 60 tablet 0   . ticagrelor (BRILINTA) 90 MG TABS tablet Take 1 tablet (90 mg total) by mouth 2 (two) times daily. 180 tablet 1     Musculoskeletal: Strength & Muscle Tone: within normal limits Gait & Station: normal Patient leans: N/A  Psychiatric Specialty Exam: Physical Exam  Nursing note and vitals reviewed. Constitutional: He appears well-developed and well-nourished.  Cardiovascular: Normal rate and regular rhythm.    Review of Systems  Constitutional: Negative.   HENT: Negative.   Eyes: Negative.   Respiratory: Negative.   Cardiovascular: Negative.   Endocrine: Negative.   Genitourinary: Negative.   Neurological: Negative.   Essential hypertension  Blood pressure (!) 121/101, pulse 91, temperature 98 F (36.7 C), temperature source Oral, resp. rate 16, SpO2 100 %.There is no height or weight on file to calculate BMI.  General Appearance: Casual  Eye Contact:  Good  Speech:  Clear and Coherent  Volume:  Normal  Mood:  Depressed  Affect:  Appropriate  Thought Process:  Coherent, Goal Directed and Descriptions of Associations: Intact  Orientation:  Full (Time, Place, and Person)  Thought Content:  Logical  Suicidal Thoughts:  Yes.  without intent/plan  Homicidal Thoughts:  No  Memory:  Immediate;   Fair Recent;   Fair Remote;   Fair  Judgement:  Fair   Insight:  Fair  Psychomotor Activity:  Normal  Concentration:  Concentration: Fair and Attention Span: Fair  Recall:  [employer] of Knowledge:  Fair  Language:  Fair  Akathisia:  Negative  Handed:  Right  AIMS (if indicated):     Assets:  Desire for Improvement Leisure Time Physical Health Resilience  ADL's:  Intact  Cognition:  WNL  Sleep:       Treatment Plan Summary: Daily contact with patient to assess and evaluate symptoms and progress in treatment and Medication management  Observation Level/Precautions:  15 minute checks  Laboratory:  UDS  Psychotherapy: Cognitive-based  Medications: Resume antidepressant therapy  Consultations:    Discharge Concerns: Longer-term stability and compliance  Estimated LOS: 3-5  Other:     Physician Treatment Plan for Primary Diagnosis:   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 History of alcohol dependence Axis II deferred Axis III hypertension  Plans- begin antidepressant therapy transfer to the [address]  Long Term Goal(s): Improvement in symptoms so as ready for discharge  Short Term Goals: Ability to identify changes in lifestyle to reduce recurrence of condition will improve, Ability to verbalize feelings will improve, Ability to disclose and discuss suicidal ideas and Ability to demonstrate self-control will improve  Physician Treatment Plan for Secondary Diagnosis: Active Problems:   MDD (major depressive disorder), single episode, severe with psychotic features ([place])  Long  Term Goal(s): Improvement in symptoms so as ready for discharge  Short Term Goals: Ability to identify changes in lifestyle to reduce recurrence of condition will improve, Ability to verbalize feelings will improve, Ability to disclose and discuss suicidal ideas, Ability to demonstrate self-control will improve and Ability to identify and develop effective coping behaviors will improve  I certify that inpatient services furnished can reasonably  be expected to improve the patient's condition.    Johnn Hai, MD 3/11/20211:37 PM

## 2019-09-07 NOTE — Tx Team (Signed)
Initial Treatment Plan 09/07/2019 3:09 PM [name] [id number]    PATIENT STRESSORS: Health problems Medication change or noncompliance   PATIENT STRENGTHS: Ability for insight Communication skills Motivation for treatment/growth   PATIENT IDENTIFIED PROBLEMS: "Depressiojn"  "medical problems"  Suicidal ideation                 DISCHARGE CRITERIA:  Ability to meet basic life and health needs Improved stabilization in mood, thinking, and/or behavior Motivation to continue treatment in a less acute level of care Reduction of life-threatening or endangering symptoms to within safe limits Verbal commitment to aftercare and medication compliance  PRELIMINARY DISCHARGE PLAN: Outpatient therapy Return to previous living arrangement  PATIENT/FAMILY INVOLVEMENT: This treatment plan has been presented to and reviewed with the patient, [name], and/or family member.  The patient and family have been given the opportunity to ask questions and make suggestions.   Harriet Masson, RN 09/07/2019, 3:09 PM

## 2019-09-08 DIAGNOSIS — F332 Major depressive disorder, recurrent severe without psychotic features: Secondary | ICD-10-CM

## 2019-09-08 LAB — GLUCOSE, CAPILLARY: Glucose-Capillary: 149 mg/dL — ABNORMAL HIGH (ref 70–99)

## 2019-09-08 LAB — LIPID PANEL
Cholesterol: 183 mg/dL (ref 0–200)
HDL: 52 mg/dL (ref >40)
LDL Cholesterol: 110 mg/dL — ABNORMAL HIGH (ref 0–99)
Total CHOL/HDL Ratio: 3.5 RATIO
Triglycerides: 105 mg/dL (ref <150)
VLDL: 21 mg/dL (ref 0–40)

## 2019-09-08 LAB — RAPID URINE DRUG SCREEN, HOSP PERFORMED
Amphetamines: NOT DETECTED
Barbiturates: NOT DETECTED
Benzodiazepines: NOT DETECTED
Cocaine: NOT DETECTED
Opiates: NOT DETECTED
Tetrahydrocannabinol: NOT DETECTED

## 2019-09-08 LAB — TSH: TSH: 1.286 u[IU]/mL (ref 0.350–4.500)

## 2019-09-08 LAB — ETHANOL: Alcohol, Ethyl (B): <10 mg/dL (ref <10)

## 2019-09-08 MED ORDER — ZOLPIDEM TARTRATE 5 MG PO TABS
10.0000 mg | ORAL_TABLET | Freq: Every day | ORAL | Status: DC
  Administered 2019-09-09 – 2019-09-10 (×2): 10 mg via ORAL
  Filled 2019-09-08 (×3): qty 2

## 2019-09-08 MED ORDER — TRAMADOL HCL 50 MG PO TABS: 50.0000 mg | ORAL_TABLET | Freq: Four times a day (QID) | ORAL | Status: DC | PRN

## 2019-09-08 MED ORDER — MIRTAZAPINE 15 MG PO TABS
15.0000 mg | ORAL_TABLET | Freq: Every day | ORAL | Status: DC
  Administered 2019-09-09 – 2019-09-10 (×3): 15 mg via ORAL
  Filled 2019-09-08 (×5): qty 1

## 2019-09-08 MED ORDER — CLONIDINE HCL 0.2 MG PO TABS
0.2000 mg | ORAL_TABLET | Freq: Two times a day (BID) | ORAL | Status: DC
  Administered 2019-09-08 – 2019-09-11 (×7): 0.2 mg via ORAL
  Filled 2019-09-08 (×11): qty 1

## 2019-09-08 MED ORDER — FLUTICASONE PROPIONATE 50 MCG/ACT NA SUSP
2.0000 | Freq: Every day | NASAL | Status: DC
  Administered 2019-09-09 – 2019-09-11 (×3): 2 via NASAL
  Filled 2019-09-08: qty 16

## 2019-09-08 MED ORDER — ARIPIPRAZOLE 2 MG PO TABS
2.0000 mg | ORAL_TABLET | Freq: Every day | ORAL | Status: DC
  Administered 2019-09-08 – 2019-09-11 (×4): 2 mg via ORAL
  Filled 2019-09-08 (×6): qty 1

## 2019-09-08 NOTE — [facility] Suicide Risk Assessment (Cosign Needed)
[place] INPATIENT:  Family/Significant Other Suicide Prevention Education  Suicide Prevention Education:  Patient Refusal for Family/Significant Other Suicide Prevention Education: The patient [name] has refused to provide written consent for family/significant other to be provided Family/Significant Other Suicide Prevention Education during admission and/or prior to discharge.  Physician notified.  Billey Chang 09/08/2019, 11:17 AM

## 2019-09-08 NOTE — Tx Team (Signed)
Interdisciplinary Treatment and Diagnostic Plan Update  09/08/2019 Time of Session: 9:25am  [name] MRN: [id number]  Principal Diagnosis: <principal problem not specified>  Secondary Diagnoses: Active Problems:   MDD (major depressive disorder), single episode, severe with psychotic features ([place])   MDD (major depressive disorder)   Current Medications:  Current Facility-Administered Medications  Medication Dose Route Frequency Provider Last Rate Last Admin  . amLODipine (NORVASC) tablet 10 mg  10 mg Oral Daily Sharma Covert, MD   10 mg at 09/08/19 0829  . ARIPiprazole (ABILIFY) tablet 2 mg  2 mg Oral Daily Johnn Hai, MD      . aspirin EC tablet 81 mg  81 mg Oral Daily Sharma Covert, MD   81 mg at 09/08/19 0829  . cloNIDine (CATAPRES) tablet 0.2 mg  0.2 mg Oral TID PRN Johnn Hai, MD   0.2 mg at 09/07/19 2127  . cloNIDine (CATAPRES) tablet 0.2 mg  0.2 mg Oral BID Johnn Hai, MD      . famotidine (PEPCID) tablet 20 mg  20 mg Oral BID Sharma Covert, MD   20 mg at 09/08/19 0240  . isosorbide mononitrate (IMDUR) 24 hr tablet 60 mg  60 mg Oral Daily Sharma Covert, MD   60 mg at 09/08/19 9735  . metFORMIN (GLUCOPHAGE) tablet 500 mg  500 mg Oral BID WC Sharma Covert, MD   500 mg at 09/08/19 3299  . nitroGLYCERIN (NITROSTAT) SL tablet 0.4 mg  0.4 mg Sublingual Q5 min PRN Sharma Covert, MD      . prenatal multivitamin tablet 1 tablet  1 tablet Oral Q1200 Johnn Hai, MD   1 tablet at 09/07/19 2129  . ticagrelor (BRILINTA) tablet 90 mg  90 mg Oral BID Sharma Covert, MD   90 mg at 09/08/19 0829  . traZODone (DESYREL) tablet 150 mg  150 mg Oral QHS PRN Johnn Hai, MD   150 mg at 09/07/19 2124  . vortioxetine HBr (TRINTELLIX) tablet 10 mg  10 mg Oral Daily Johnn Hai, MD   10 mg at 09/08/19 2426  . zolpidem (AMBIEN) tablet 10 mg  10 mg Oral QHS Johnn Hai, MD       PTA Medications: Medications Prior to Admission  Medication Sig Dispense  Refill Last Dose  . amLODipine (NORVASC) 10 MG tablet Take 1 tablet (10 mg total) by mouth daily. 90 tablet 1   . aspirin 81 MG chewable tablet Chew 1 tablet (81 mg total) by mouth daily. 30 tablet 3   . co-enzyme Q-10 30 MG capsule Take 30 mg by mouth daily.     . isosorbide mononitrate (IMDUR) 60 MG 24 hr tablet Take 1.5 tablets (90 mg total) by mouth daily. 135 tablet 3   . LIVALO 4 MG TABS TAKE 1 TABLET BY MOUTH ONCE DAILY IN THE MORNING (Patient taking differently: Take 1 tablet by mouth every morning. ) 30 tablet 3   . metFORMIN (GLUCOPHAGE) 500 MG tablet TAKE 1 TABLET BY MOUTH TWICE DAILY WITH A MEAL (Patient taking differently: Take 500 mg by mouth 2 (two) times daily with a meal. ) 180 tablet 0   . metoprolol tartrate (LOPRESSOR) 100 MG tablet Take 1 tablet by mouth twice daily 60 tablet 0   . ticagrelor (BRILINTA) 90 MG TABS tablet Take 1 tablet (90 mg total) by mouth 2 (two) times daily. 180 tablet 1     Patient Stressors: Health problems Medication change or noncompliance  Patient Strengths:  Ability for insight Communication skills Motivation for treatment/growth  Treatment Modalities: Medication Management, Group therapy, Case management,  1 to 1 session with clinician, Psychoeducation, Recreational therapy.   Physician Treatment Plan for Primary Diagnosis: <principal problem not specified> Long Term Goal(s): Improvement in symptoms so as ready for discharge Improvement in symptoms so as ready for discharge   Short Term Goals: Ability to identify changes in lifestyle to reduce recurrence of condition will improve Ability to verbalize feelings will improve Ability to disclose and discuss suicidal ideas Ability to demonstrate self-control will improve Ability to identify changes in lifestyle to reduce recurrence of condition will improve Ability to verbalize feelings will improve Ability to disclose and discuss suicidal ideas Ability to demonstrate self-control will  improve Ability to identify and develop effective coping behaviors will improve  Medication Management: Evaluate patient's response, side effects, and tolerance of medication regimen.  Therapeutic Interventions: 1 to 1 sessions, Unit Group sessions and Medication administration.  Evaluation of Outcomes: Not Met  Physician Treatment Plan for Secondary Diagnosis: Active Problems:   MDD (major depressive disorder), single episode, severe with psychotic features ([place])   MDD (major depressive disorder)  Long Term Goal(s): Improvement in symptoms so as ready for discharge Improvement in symptoms so as ready for discharge   Short Term Goals: Ability to identify changes in lifestyle to reduce recurrence of condition will improve Ability to verbalize feelings will improve Ability to disclose and discuss suicidal ideas Ability to demonstrate self-control will improve Ability to identify changes in lifestyle to reduce recurrence of condition will improve Ability to verbalize feelings will improve Ability to disclose and discuss suicidal ideas Ability to demonstrate self-control will improve Ability to identify and develop effective coping behaviors will improve     Medication Management: Evaluate patient's response, side effects, and tolerance of medication regimen.  Therapeutic Interventions: 1 to 1 sessions, Unit Group sessions and Medication administration.  Evaluation of Outcomes: Not Met   RN Treatment Plan for Primary Diagnosis: <principal problem not specified> Long Term Goal(s): Knowledge of disease and therapeutic regimen to maintain health will improve  Short Term Goals: Ability to participate in decision making will improve, Ability to disclose and discuss suicidal ideas, Ability to identify and develop effective coping behaviors will improve and Compliance with prescribed medications will improve  Medication Management: RN will administer medications as ordered by provider,  will assess and evaluate patient's response and provide education to patient for prescribed medication. RN will report any adverse and/or side effects to prescribing provider.  Therapeutic Interventions: 1 on 1 counseling sessions, Psychoeducation, Medication administration, Evaluate responses to treatment, Monitor vital signs and CBGs as ordered, Perform/monitor CIWA, COWS, AIMS and Fall Risk screenings as ordered, Perform wound care treatments as ordered.  Evaluation of Outcomes: Not Met   LCSW Treatment Plan for Primary Diagnosis: <principal problem not specified> Long Term Goal(s): Safe transition to appropriate next level of care at discharge, Engage patient in therapeutic group addressing interpersonal concerns.  Short Term Goals: Engage patient in aftercare planning with referrals and resources  Therapeutic Interventions: Assess for all discharge needs, 1 to 1 time with Social worker, Explore available resources and support systems, Assess for adequacy in community support network, Educate family and significant other(s) on suicide prevention, Complete Psychosocial Assessment, Interpersonal group therapy.  Evaluation of Outcomes: Not Met   Progress in Treatment: Attending groups: No. New to unit  Participating in groups: No. Taking medication as prescribed: Yes. Toleration medication: Yes. Family/Significant other contact made: No, will contact:  if patient consents to collateral contacts Patient understands diagnosis: Yes. Discussing patient identified problems/goals with staff: Yes. Medical problems stabilized or resolved: Yes. Denies suicidal/homicidal ideation: Yes. Issues/concerns per patient self-inventory: No. Other:   New problem(s) identified: None  New Short Term/Long Term Goal(s): Detox, medication stabilization, elimination of SI thoughts, development of comprehensive mental wellness plan.    Patient Goals: "Getting back to being [name] Proud again. My issue is my  depression. I stopped taking my medications"   Discharge Plan or Barriers: Patient recently admitted. CSW will continue to follow and assess for appropriate referrals and possible discharge planning.    Reason for Continuation of Hospitalization: Depression Medical Issues Medication stabilization Suicidal ideation  Estimated Length of Stay: 3-5 days  Attendees: Patient: [name]  09/08/2019 8:59 AM  Physician: Dr. Neita Garnet, MD 09/08/2019 8:59 AM  Nursing:  09/08/2019 8:59 AM  RN Care Manager: 09/08/2019 8:59 AM  Social Worker: Radonna Ricker, LCSW 09/08/2019 8:59 AM  Recreational Therapist:  09/08/2019 8:59 AM  Other:  09/08/2019 8:59 AM  Other:  09/08/2019 8:59 AM  Other: 09/08/2019 8:59 AM    Scribe for Treatment Team: [name], [place] 09/08/2019 8:59 AM

## 2019-09-08 NOTE — Progress Notes (Signed)
Pt is alert and oriented to person, place and time. Pt is calm, cooperative, denies suicidal and homicidal ideation, denies hallucinations, denies feelings of depression and anxiety. Pt's affect is flat, makes good eye contact, pt is seclusive to self and to room, hypoactive, hypoverbal, medication compliant. Pt filled out his self inventory, reports his goals for the day is "Not wanting to leave." Will continue to monitor pt per Q15 minute face checks and monitor for safety and progress.

## 2019-09-08 NOTE — Progress Notes (Signed)
   09/08/19 1221  Psych Admission Type (Psych Patients Only)  Admission Status Voluntary  Psychosocial Assessment  Patient Complaints None  Eye Contact Brief  Facial Expression Flat;Sullen  Affect Flat;Sullen  Speech Logical/coherent  Interaction Minimal  Motor Activity Other (Comment) (WNL)  Appearance/Hygiene Unremarkable  Behavior Characteristics Cooperative  Mood Anxious  Aggressive Behavior  Effect No apparent injury  Thought Process  Coherency WDL  Content WDL  Delusions None reported or observed  Perception WDL  Hallucination None reported or observed  Judgment WDL  Confusion None  Danger to Self  Current suicidal ideation? Denies  Self-Injurious Behavior No self-injurious ideation or behavior indicators observed or expressed   Agreement Not to Harm Self Yes  Description of Agreement verbally contracts for safety  Danger to Others  Danger to Others None reported or observed

## 2019-09-08 NOTE — Progress Notes (Signed)
Recreation Therapy Notes  Date:  3.12.21 Time: 0930 Location: [address] [facility]  Group Topic: Stress Management  Goal Area(s) Addresses:  Patient will identify positive stress management techniques. Patient will identify benefits of using stress management post d/c.  Intervention: Stress Management  Activity :  Meditation.  LRT played a meditation that focused on being resilient in the face of adversity.  Patients were to listen as meditation played to engage in activity.  Education:  Stress Management, Discharge Planning.   Education Outcome: Acknowledges Education  Clinical Observations/Feedback: Pt did not attend group activity.    [name], LRT/CTRS         [name] A 09/08/2019 10:58 AM

## 2019-09-08 NOTE — Progress Notes (Signed)
[facility] MD Progress Note  09/08/2019 8:37 AM [name]  MRN:  [id number] Subjective:    This is a repeat admission for [name] is a 59 year old individual with recurrent and severe depression, presenting approximately 6 months after his last discharge, reporting suicidal thoughts and noncompliance with medications approximately 4 months.  The patient elaborates that he tosses and turns at night he continues to have negative ruminations and further believes that the previous medication he was on was helpful "at a lower dose" what he is referring to is why we lowered his aripiprazole augmentation to just 2 mg a day that he found very helpful.  He engages in cognitive therapy he denies thoughts of harming self today but can contract for safety here only.  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 Allergies   . Arthritis   . Chest pain   . Chronic lower back pain   . Chronic pain of right wrist   . Coronary artery disease    a. Multiple prior caths/PCI. Cath 2013 with possible spasm of RCA, 70% ISR of mid LCx with subsequent DES to mLCx and prox LCX. b. H/o microvascular angina. c. Recurrent angina 08/2014 - s/p PTCA/DES to prox Cx, PTCA/CBA to OM1.  c. LHC 06/10/15 with patent stents and some ISR in LCX and OM-1 that was not flow limiting --> Rx   . Dyslipidemia    a. Intolerant to many statins except tolerating Livalo.  Marland Kitchen GERD (gastroesophageal reflux disease)   . H/O cardiac catheterization 10/25/2018  . Hypertension   . Myocardial infarction ([place]) ~ 2010  . S/P angioplasty with stent, DES, to  proximal and mid LCX 12/15/11 12/15/2011  . Shoulder pain   . Stroke [facility])    pt. reports had a stroke around time of MI 2010  . Type II diabetes mellitus ([place])   . Unstable angina [facility])     Past Surgical History:  Procedure Laterality Date  . CARDIAC CATHETERIZATION  06/15/2002   LAD with prox 40% stenosis, norma L main, Cfx with 25% lesion, RCA with long mid 25% stenosis (Dr. Vita Barley)  . CARDIAC CATHETERIZATION  04/01/2010   normal L main, LAD wit mild stenosis, L Cfx with 70% in-stent restenosis, RCA with 70% in-stent restenosis, LVEF >60% (Dr. K. Mali Hilty) - cutting ballon arthrectomy to RCA & Cfx (Dr. Rockne Menghini)  . CARDIAC CATHETERIZATION  08/25/2010   preserved global LV contractility; multivessel CAD, diffuse 90-95% in-stent restenosis in prox placed Cfx stent - cutting balloon arthrectomy in Cfx with multiple dilatations 90-95% to 0% stenosis (Dr. Corky Downs)  . CARDIAC CATHETERIZATION  01/26/2011   PCI & stenting of aggresive in-stent restenosis within previously stented AV groove Cfx with 3.0x82mm Taxus DES (previous stents were Promus) (Dr. Adora Fridge)  . CARDIAC CATHETERIZATION  05/11/2011   preserved LV function, 40% mid LAD stenosis, 30-40% narrowing proximal to stented semgnet of prox Cfx, patent mid RCA stent with smooth 20% narrowing in distal RCA (Dr. Corky Downs)  . CARDIAC CATHETERIZATION  12/15/2011   PCI & stenting of proximal & mid Cfx with DES - 3.0x66mm in proximal, 3.0x81mm in mid (Dr.  Adora Fridge)  . CARDIAC CATHETERIZATION N/A 06/10/2015   Procedure: Left Heart Cath and Coronary Angiography;  Surgeon: Jolaine Artist, MD;  Location: [name] [facility];  Service: Cardiovascular;  Laterality: N/A;  . CARDIAC CATHETERIZATION  10/25/2018  . cardiometabolic testing  123456   good exercise effort, peak VO2 79% predicted with normal VO2 HR curves (mild deconditioning)  . COLONOSCOPY  12/2012   diminutive hyperplastic sigmoid poyp so repeat routine 2024  . CORONARY BALLOON  ANGIOPLASTY N/A 10/25/2018   Procedure: CORONARY BALLOON ANGIOPLASTY;  Surgeon: Jettie Booze, MD;  Location: [place] [facility];  Service: Cardiovascular;  Laterality: N/A;  . EXCISIONAL HEMORRHOIDECTOMY  1984  . LEFT HEART CATH AND CORONARY ANGIOGRAPHY N/A 10/25/2018   Procedure: LEFT HEART CATH AND CORONARY ANGIOGRAPHY;  Surgeon: Jettie Booze, MD;  Location: [place] [facility];  Service: Cardiovascular;  Laterality: N/A;  . LEFT HEART CATHETERIZATION WITH CORONARY ANGIOGRAM N/A 05/11/2011   Procedure: LEFT HEART CATHETERIZATION WITH CORONARY ANGIOGRAM;  Surgeon: Troy Sine, MD;  Location: [facility] CATH LAB;  Service: Cardiovascular;  Laterality: N/A;  Possible percutaneous coronary intervention, possible IVUS  . LEFT HEART CATHETERIZATION WITH CORONARY ANGIOGRAM N/A 12/15/2011   Procedure: LEFT HEART CATHETERIZATION WITH CORONARY ANGIOGRAM;  Surgeon: Lorretta Harp, MD;  Location: [facility] CATH LAB;  Service: Cardiovascular;  Laterality: N/A;  . LEFT HEART CATHETERIZATION WITH CORONARY ANGIOGRAM N/A 09/05/2014   Procedure: LEFT HEART CATHETERIZATION WITH CORONARY ANGIOGRAM;  Surgeon: Burnell Blanks, MD;  Location: [facility] CATH LAB;  Service: Cardiovascular;  Laterality: N/A;  . LIPOMA EXCISION     back of the head  . NM MYOCAR PERF WALL MOTION  02/2012   lexiscan myoview; mild perfusion defect in mid inferolateral & basal inferolateral region (infarct/scar); EF 52%, abnormal but ow risk scan  . PERCUTANEOUS CORONARY STENT INTERVENTION (PCI-S)  09/05/2014   Procedure: PERCUTANEOUS CORONARY STENT INTERVENTION (PCI-S);  Surgeon: Burnell Blanks, MD;  Location[facility] CATH LAB;  Service: Cardiovascular;;   Family History:  Family History  Problem Relation Age of Onset  . Leukemia Mother   . Prostate cancer Father   . Coronary artery disease Paternal Grandmother   . Cancer Paternal Grandfather   . Cancer Brother    Family Psychiatric  History: No new data shared Social History:   Social History   Substance and Sexual Activity  Alcohol Use Yes     Social History   Substance and Sexual Activity  Drug Use No   Comment: 12/15/11 "last cocaine was 2010"    Social History   Socioeconomic History  . Marital status: Divorced    Spouse name: Not on file  . Number of children: 2  . Years of education: GED  . Highest education level: Not on file  Occupational History  . Not on file  Tobacco Use  . Smoking status: Former Smoker    Packs/day: 1.00    Years: 10.00    Pack years: 10.00    Types: Cigarettes    Quit date: 10/07/2018    Years since quitting: 0.9  . Smokeless tobacco: Never Used  Substance and Sexual Activity  . Alcohol use: Yes  . Drug use: No    Comment: 12/15/11 "last cocaine was 2010"  . Sexual activity: Yes  Other Topics Concern  . Not on file  Social History Narrative  . Not on file   Social Determinants of Health   Financial Resource Strain:   . Difficulty of Paying Living Expenses:  Food Insecurity:   . Worried About Charity fundraiser in the Last Year:   . Arboriculturist in the Last Year:   Transportation Needs:   . Film/video editor (Medical):   Marland Kitchen Lack of Transportation (Non-Medical):   Physical Activity:   . Days of Exercise per Week:   . Minutes of Exercise per Session:   Stress:   . Feeling of Stress :   Social Connections:   . Frequency of Communication with Friends and Family:   . Frequency of Social Gatherings with Friends and Family:   . Attends Religious Services:   . Active Member of Clubs or Organizations:   . Attends Archivist Meetings:   Marland Kitchen Marital Status:    Additional Social History:    Pain Medications: See MARs Prescriptions: See MARs Over the Counter: See MARs History of alcohol / drug use?: Yes Name of Substance 1: Alcohol 1 - Age of First Use: unknown 1 - Amount (size/oz): unknown 1 - Frequency: unknown 1 - Duration: unknown 1 - Last Use / Amount: August 2020                   Sleep: Fair  Appetite:  Fair  Current Medications: Current Facility-Administered Medications  Medication Dose Route Frequency Provider Last Rate Last Admin  . amLODipine (NORVASC) tablet 10 mg  10 mg Oral Daily Sharma Covert, MD   10 mg at 09/08/19 0829  . ARIPiprazole (ABILIFY) tablet 2 mg  2 mg Oral Daily Johnn Hai, MD      . aspirin EC tablet 81 mg  81 mg Oral Daily Sharma Covert, MD   81 mg at 09/08/19 0829  . cloNIDine (CATAPRES) tablet 0.2 mg  0.2 mg Oral TID PRN Johnn Hai, MD   0.2 mg at 09/07/19 2127  . cloNIDine (CATAPRES) tablet 0.2 mg  0.2 mg Oral BID Johnn Hai, MD      . famotidine (PEPCID) tablet 20 mg  20 mg Oral BID Sharma Covert, MD   20 mg at 09/08/19 [id number]  . isosorbide mononitrate (IMDUR) 24 hr tablet 60 mg  60 mg Oral Daily Sharma Covert, MD   60 mg at 09/08/19 [id number]  . metFORMIN (GLUCOPHAGE) tablet 500 mg  500 mg Oral BID WC Sharma Covert, MD   500 mg at 09/08/19 [id number]  . nitroGLYCERIN (NITROSTAT) SL tablet 0.4 mg  0.4 mg Sublingual Q5 min PRN Sharma Covert, MD      . prenatal multivitamin tablet 1 tablet  1 tablet Oral Q1200 Johnn Hai, MD   1 tablet at 09/07/19 2129  . ticagrelor (BRILINTA) tablet 90 mg  90 mg Oral BID Sharma Covert, MD   90 mg at 09/08/19 0829  . traZODone (DESYREL) tablet 150 mg  150 mg Oral QHS PRN Johnn Hai, MD   150 mg at 09/07/19 2124  . vortioxetine HBr (TRINTELLIX) tablet 10 mg  10 mg Oral Daily Johnn Hai, MD   10 mg at 09/08/19 [id number]    Lab Results:  Results for orders placed or performed during the hospital encounter of 09/07/19 (from the past 48 hour(s))  Respiratory Panel by RT PCR (Flu A&B, Covid) -     Status: None   Collection Time: 09/07/19 11:03 AM  Result Value Ref Range   SARS Coronavirus 2 by RT PCR NEGATIVE NEGATIVE    Comment: (NOTE) SARS-CoV-2 target nucleic acids are NOT DETECTED. The SARS-CoV-2 RNA is generally  detectable in upper respiratoy specimens during the  acute phase of infection. The lowest concentration of SARS-CoV-2 viral copies this assay can detect is 131 copies/mL. A negative result does not preclude SARS-Cov-2 infection and should not be used as the sole basis for treatment or other patient management decisions. A negative result may occur with  improper specimen collection/handling, submission of specimen other than nasopharyngeal swab, presence of viral mutation(s) within the areas targeted by this assay, and inadequate number of viral copies (<131 copies/mL). A negative result must be combined with clinical observations, patient history, and epidemiological information. The expected result is Negative. Fact Sheet for Patients:  PinkCheek.be Fact Sheet for Healthcare Providers:  GravelBags.it This test is not yet ap proved or cleared by the [place] FDA and  has been authorized for detection and/or diagnosis of SARS-CoV-2 by FDA under an Emergency Use Authorization (EUA). This EUA will remain  in effect (meaning this test can be used) for the duration of the COVID-19 declaration under Section 564(b)(1) of the Act, 21 U.S.C. section 360bbb-3(b)(1), unless the authorization is terminated or revoked sooner.    Influenza A by PCR NEGATIVE NEGATIVE   Influenza B by PCR NEGATIVE NEGATIVE    Comment: (NOTE) The Xpert Xpress SARS-CoV-2/FLU/RSV assay is intended as an aid in  the diagnosis of influenza from Nasopharyngeal swab specimens and  should not be used as a sole basis for treatment. Nasal washings and  aspirates are unacceptable for Xpert Xpress SARS-CoV-2/FLU/RSV  testing. Fact Sheet for Patients: PinkCheek.be Fact Sheet for Healthcare Providers: GravelBags.it This test is not yet approved or cleared by the [place] FDA and  has been authorized for detection and/or diagnosis of SARS-CoV-2 by  FDA  under an Emergency Use Authorization (EUA). This EUA will remain  in effect (meaning this test can be used) for the duration of the  Covid-19 declaration under Section 564(b)(1) of the Act, 21  U.S.C. section 360bbb-3(b)(1), unless the authorization is  terminated or revoked. Performed at [facility], [place] [address], [place], [place] [address]   Rapid urine drug screen (hospital performed)     Status: None   Collection Time: 09/07/19 11:13 AM  Result Value Ref Range   Opiates NONE DETECTED NONE DETECTED   Cocaine NONE DETECTED NONE DETECTED   Benzodiazepines NONE DETECTED NONE DETECTED   Amphetamines NONE DETECTED NONE DETECTED   Tetrahydrocannabinol NONE DETECTED NONE DETECTED   Barbiturates NONE DETECTED NONE DETECTED    Comment: (NOTE) DRUG SCREEN FOR MEDICAL PURPOSES ONLY.  IF CONFIRMATION IS NEEDED FOR ANY PURPOSE, NOTIFY LAB WITHIN 5 DAYS. LOWEST DETECTABLE LIMITS FOR URINE DRUG SCREEN Drug Class                     Cutoff (ng/mL) Amphetamine and metabolites    1000 Barbiturate and metabolites    200 Benzodiazepine                 [id number] Tricyclics and metabolites     300 Opiates and metabolites        300 Cocaine and metabolites        300 THC                            50 Performed at [facility], [place] [address] [place], [place] [address]   CBC with Differential/Platelet     Status: Abnormal   Collection Time: 09/07/19  6:31 PM  Result Value Ref Range   WBC 5.7 4.0 - 10.5 K/uL   RBC 4.42 4.22 - 5.81 MIL/uL   Hemoglobin 12.7 (L) 13.0 - 17.0 g/dL   HCT 40.0 39.0 - 52.0 %   MCV 90.5 80.0 - 100.0 fL   MCH 28.7 26.0 - 34.0 pg   MCHC 31.8 30.0 - 36.0 g/dL   RDW 12.6 11.5 - 15.5 %   Platelets 220 150 - 400 K/uL   nRBC 0.0 0.0 - 0.2 %   Neutrophils Relative % 52 %   Neutro Abs 3.0 1.7 - 7.7 K/uL   Lymphocytes Relative 36 %   Lymphs Abs 2.1 0.7 - 4.0 K/uL   Monocytes Relative 8 %   Monocytes Absolute 0.5 0.1 - 1.0 K/uL    Eosinophils Relative 3 %   Eosinophils Absolute 0.2 0.0 - 0.5 K/uL   Basophils Relative 0 %   Basophils Absolute 0.0 0.0 - 0.1 K/uL   Immature Granulocytes 1 %   Abs Immature Granulocytes 0.03 0.00 - 0.07 K/uL    Comment: Performed at [facility], [place] [address], [place], [place] [address]  Comprehensive metabolic panel     Status: Abnormal   Collection Time: 09/07/19  6:31 PM  Result Value Ref Range   Sodium 142 135 - 145 mmol/L   Potassium 4.0 3.5 - 5.1 mmol/L   Chloride 110 98 - 111 mmol/L   CO2 24 22 - 32 mmol/L   Glucose, Bld 149 (H) 70 - 99 mg/dL    Comment: Glucose reference range applies only to samples taken after fasting for at least 8 hours.   BUN 15 6 - 20 mg/dL   Creatinine, Ser 0.76 0.61 - 1.24 mg/dL   Calcium 9.1 8.9 - 10.3 mg/dL   Total Protein 6.6 6.5 - 8.1 g/dL   Albumin 4.3 3.5 - 5.0 g/dL   AST 21 15 - 41 U/L   ALT 28 0 - 44 U/L   Alkaline Phosphatase 42 38 - 126 U/L   Total Bilirubin 0.5 0.3 - 1.2 mg/dL   GFR calc non Af Amer >60 >60 mL/min   GFR calc Af Amer >60 >60 mL/min   Anion gap 8 5 - 15    Comment: Performed at [facility], [place] [address], [place], [place] [address]  Hemoglobin A1c     Status: Abnormal   Collection Time: 09/07/19  6:31 PM  Result Value Ref Range   Hgb A1c MFr Bld 6.2 (H) 4.8 - 5.6 %    Comment: (NOTE) Pre diabetes:          5.7%-6.4% Diabetes:              >6.4% Glycemic control for   <7.0% adults with diabetes    Mean Plasma Glucose 131.24 mg/dL    Comment: Performed at [place] [address] [place], [place] [address]  TSH     Status: None   Collection Time: 09/07/19  6:31 PM  Result Value Ref Range   TSH 1.893 0.350 - 4.500 uIU/mL    Comment: Performed by a 3rd Generation assay with a functional sensitivity of <=0.01 uIU/mL. Performed at [facility], [place] [address], [place], [place] [address]   Glucose, capillary     Status: Abnormal   Collection  Time: 09/08/19  5:45 AM  Result Value Ref Range   Glucose-Capillary 149 (H) 70 - 99 mg/dL    Comment: Glucose reference range applies only  to samples taken after fasting for at least 8 hours.   Comment 1 Notify RN    Comment 2 Document in Chart   Ethanol     Status: None   Collection Time: 09/08/19  6:21 AM  Result Value Ref Range   Alcohol, Ethyl (B) <10 <10 mg/dL    Comment: (NOTE) Lowest detectable limit for serum alcohol is 10 mg/dL. For medical purposes only. Performed at [facility], [place] [address], [place], [place] [address]   Lipid panel     Status: Abnormal   Collection Time: 09/08/19  6:21 AM  Result Value Ref Range   Cholesterol 183 0 - 200 mg/dL   Triglycerides 105 <150 mg/dL   HDL 52 >40 mg/dL   Total CHOL/HDL Ratio 3.5 RATIO   VLDL 21 0 - 40 mg/dL   LDL Cholesterol 110 (H) 0 - 99 mg/dL    Comment:        Total Cholesterol/HDL:CHD Risk Coronary Heart Disease Risk Table                     Men   Women  1/2 Average Risk   3.4   3.3  Average Risk       5.0   4.4  2 X Average Risk   9.6   7.1  3 X Average Risk  23.4   11.0        Use the calculated Patient Ratio above and the CHD Risk Table to determine the patient's CHD Risk.        ATP III CLASSIFICATION (LDL):  <100     mg/dL   Optimal  100-129  mg/dL   Near or Above                    Optimal  130-159  mg/dL   Borderline  160-189  mg/dL   High  >190     mg/dL   Very High Performed at [place] [address] [place], [place] [address]     Blood Alcohol level:  Lab Results  Component Value Date   ETH <10 09/08/2019   ETH <10 0000000    Metabolic Disorder Labs: Lab Results  Component Value Date   HGBA1C 6.2 (H) 09/07/2019   MPG 131.24 09/07/2019   MPG 142.72 10/25/2018   No results found for: PROLACTIN Lab Results  Component Value Date   CHOL 183 09/08/2019   TRIG 105 09/08/2019   HDL 52 09/08/2019   CHOLHDL 3.5 09/08/2019   VLDL 21  09/08/2019   LDLCALC 110 (H) 09/08/2019   LDLCALC 62 07/05/2019    Physical Findings: AIMS: Facial and Oral Movements Muscles of Facial Expression: None, normal Lips and Perioral Area: None, normal Jaw: None, normal Tongue: None, normal,Extremity Movements Upper (arms, wrists, hands, fingers): None, normal Lower (legs, knees, ankles, toes): None, normal, Trunk Movements Neck, shoulders, hips: None, normal, Overall Severity Severity of abnormal movements (highest score from questions above): None, normal Incapacitation due to abnormal movements: None, normal Patient's awareness of abnormal movements (rate only patient's report): No Awareness, Dental Status Current problems with teeth and/or dentures?: No Does patient usually wear dentures?: No  CIWA:    COWS:  COWS Total Score: 1  Musculoskeletal: Strength & Muscle Tone: within normal limits Gait & Station: normal Patient leans: N/A  Psychiatric Specialty Exam: Physical Exam  Review of Systems  Blood pressure (!) 145/95, pulse 89, temperature (!) 97.3 F (36.3 C),  temperature source Oral, resp. rate 16, height 5\' 8"  (1.727 m), weight 83.9 kg, SpO2 100 %.Body mass index is 28.13 kg/m.  General Appearance: Casual  Eye Contact:  Good  Speech:  Clear and Coherent  Volume:  Normal  Mood:  Dysphoric  Affect:  Congruent  Thought Process:  Coherent and Goal Directed  Orientation:  Full (Time, Place, and Person)  Thought Content:  Rumination  Suicidal Thoughts:  Yes.  without intent/plan passive today  Homicidal Thoughts:  No  Memory:  Immediate;   Fair Recent;   Fair Remote;   Fair  Judgement:  Fair  Insight:  Fair  Psychomotor Activity:  Normal  Concentration:  Concentration: Fair and Attention Span: Fair  Recall:  [employer] of Knowledge:  Fair  Language:  Good  Akathisia:  Negative  Handed:  Right  AIMS (if indicated):     Assets:  Communication Skills Physical Health Resilience Social Support  ADL's:  Intact   Cognition:  WNL  Sleep:      Treatment Plan Summary: Daily contact with patient to assess and evaluate symptoms and progress in treatment and Medication management  We will augment with aripiprazole 2 mg a day add Sleep Aid Continue cognitive therapy Monitor through the weekend most likely No change in therapy focus or precautions Due to hypertension we will add Catapres standing in addition to the Norvasc continue as needed Catapres   Khriz Liddy, MD 09/08/2019, 8:37 AM
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
## 2019-09-08 NOTE — B[facility] Group Notes (Signed)
Type of Therapy/Topic: Identifying Irrational Beliefs/Thoughts  Participation Level: Did Not Attend  Description of Group: The purpose of this group is to assist patients in learning to identify irrational beliefs and thoughts that contribute to their negative emotions and experience positive emotions. Patients will be guided to discuss ways in which they have been effected by irrational thoughts and beliefs and how to transform those irrational beliefs into rational ones. Newly identified rational beliefs will be juxtaposed with experiences of positive emotions or situations, and patients will be challenged to use rational beliefs or thoughts to combat negative ones. Special emphasis will be placed on coping with irrational beliefs in conflict situations, and patients will process healthy conflict resolution skills.  Therapeutic Goals: 1. Patient will identify two irrational thoughts or beliefs  to reflect on in order to balance out those thoughts 2. Patient will label two or more irrational thoughts/beliefs that they find the most difficult to cope with 3. Patient will demonstrate positive conflict resolution skills through discussion and/or role plays that will assist in transforming irrational thoughts or beliefs into positive ones.  Summary of Patient Progress:  Invited, chose not attend.    Therapeutic Modalities: Cognitive Behavioral Therapy Feelings Identification Dialectical Behavioral Therapy

## 2019-09-09 DIAGNOSIS — F323 Major depressive disorder, single episode, severe with psychotic features: Principal | ICD-10-CM

## 2019-09-09 LAB — GLUCOSE, CAPILLARY: Glucose-Capillary: 125 mg/dL — ABNORMAL HIGH (ref 70–99)

## 2019-09-09 NOTE — Progress Notes (Signed)
   09/09/19 2316  COVID-19 Daily Checkoff  Have you had a fever (temp > 37.80C/100F)  in the past 24 hours?  No  If you have had runny nose, nasal congestion, sneezing in the past 24 hours, has it worsened? No  COVID-19 EXPOSURE  Have you traveled outside the state in the past 14 days? No  Have you been in contact with someone with a confirmed diagnosis of COVID-19 or PUI in the past 14 days without wearing appropriate PPE? No  Have you been living in the same home as a person with confirmed diagnosis of COVID-19 or a PUI (household contact)? No  Have you been diagnosed with COVID-19? No

## 2019-09-09 NOTE — Progress Notes (Signed)
[facility] MD Progress Note  09/09/2019 1:50 PM [name]  MRN:  [id number]  Subjective: Suhail reports, "I'm not doing very well today".  This is a repeat admission for Mr. [name] is a 59 year old individual with recurrent and severe depression, presenting approximately 6 months after his last discharge, reporting suicidal thoughts and noncompliance with medications approximately 4 months. The patient elaborates that he tosses and turns at night he continues to have negative ruminations and further believes that the previous medication he was on was helpful "at a lower dose" what he is referring to is why we lowered his aripiprazole augmentation to just 2 mg a day that he found very helpful. He engages in cognitive therapy he denies thoughts of harming self today but can contract for safety here only. No involuntary movements- Hubbert is seen, chart reviewed. The chart findings discussed with the treatment team. He presents alert, oriented & aware of situations. He is visible on the unit, attending group sessions. He is taking & tolerating his medications. He says he is not doing very well today. He says he continues to experience depressive symptoms. However, he says he is trying to be optimistic about his recovery since he is back on his medications again. He says he went several months without his medications. He rates his depression & anxiety both at #7 in the scale (1-10), where 1 is the least depressed & 10 the worst. He is taking & tolerating his medications. Denies any side effects. He denies any SIHI, AVH, delusional thoughts or paranoia. He does not appear to be responding to internal stimuli. Braidan is agreement to continue his current plan of care as already in progress.   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 Allergies   . Arthritis   . Chest pain   . Chronic lower back pain   . Chronic pain of right wrist   . Coronary artery disease    a. Multiple prior caths/PCI. Cath 2013 with possible spasm of RCA, 70% ISR of mid LCx with subsequent DES to mLCx and prox LCX. b. H/o microvascular angina. c. Recurrent angina 08/2014 - s/p PTCA/DES to prox Cx, PTCA/CBA to OM1.  c. LHC 06/10/15 with patent stents and some ISR in LCX and OM-1 that was not flow limiting --> Rx   . Dyslipidemia    a. Intolerant to many statins except tolerating Livalo.  Marland Kitchen GERD (gastroesophageal reflux disease)   . H/O cardiac catheterization 10/25/2018  . Hypertension   . Myocardial infarction ([place]) ~ 2010  . S/P angioplasty with stent, DES, to proximal and mid LCX 12/15/11 12/15/2011  . Shoulder pain   . Stroke [facility])    pt. reports had a stroke around time of MI 2010  . Type II diabetes mellitus ([place])   . Unstable angina [facility])     Past Surgical History:  Procedure Laterality Date  . CARDIAC CATHETERIZATION  06/15/2002   LAD with prox 40% stenosis, norma L main, Cfx with 25% lesion, RCA with long mid 25% stenosis (Dr. Vita Barley)  . CARDIAC CATHETERIZATION  04/01/2010   normal L main, LAD wit mild stenosis, L Cfx with 70% in-stent restenosis, RCA with  70% in-stent restenosis, LVEF >60% (Dr. K. Mali Hilty) - cutting ballon arthrectomy to RCA & Cfx (Dr. Loni Muse. Little)  . CARDIAC CATHETERIZATION  08/25/2010   preserved global LV contractility; multivessel CAD, diffuse 90-95% in-stent restenosis in prox placed Cfx stent - cutting balloon arthrectomy in Cfx with multiple dilatations 90-95% to 0% stenosis (Dr. Corky Downs)  . CARDIAC CATHETERIZATION  01/26/2011   PCI & stenting of aggresive in-stent restenosis within previously stented AV  groove Cfx with 3.0x72mm Taxus DES (previous stents were Promus) (Dr. Adora Fridge)  . CARDIAC CATHETERIZATION  05/11/2011   preserved LV function, 40% mid LAD stenosis, 30-40% narrowing proximal to stented semgnet of prox Cfx, patent mid RCA stent with smooth 20% narrowing in distal RCA (Dr. Corky Downs)  . CARDIAC CATHETERIZATION  12/15/2011   PCI & stenting of proximal & mid Cfx with DES - 3.0x20mm in proximal, 3.0x3mm in mid (Dr. Adora Fridge)  . CARDIAC CATHETERIZATION N/A 06/10/2015   Procedure: Left Heart Cath and Coronary Angiography;  Surgeon: Jolaine Artist, MD;  Location: [place] [facility];  Service: Cardiovascular;  Laterality: N/A;  . CARDIAC CATHETERIZATION  10/25/2018  . cardiometabolic testing  123456   good exercise effort, peak VO2 79% predicted with normal VO2 HR curves (mild deconditioning)  . COLONOSCOPY  12/2012   diminutive hyperplastic sigmoid poyp so repeat routine 2024  . CORONARY BALLOON ANGIOPLASTY N/A 10/25/2018   Procedure: CORONARY BALLOON ANGIOPLASTY;  Surgeon: Jettie Booze, MD;  Location: [place] [facility];  Service: Cardiovascular;  Laterality: N/A;  . EXCISIONAL HEMORRHOIDECTOMY  1984  . LEFT HEART CATH AND CORONARY ANGIOGRAPHY N/A 10/25/2018   Procedure: LEFT HEART CATH AND CORONARY ANGIOGRAPHY;  Surgeon: Jettie Booze, MD;  Location: [place] [facility];  Service: Cardiovascular;  Laterality: N/A;  . LEFT HEART CATHETERIZATION WITH CORONARY ANGIOGRAM N/A 05/11/2011   Procedure: LEFT HEART CATHETERIZATION WITH CORONARY ANGIOGRAM;  Surgeon: Troy Sine, MD;  Location: [facility] CATH LAB;  Service: Cardiovascular;  Laterality: N/A;  Possible percutaneous coronary intervention, possible IVUS  . LEFT HEART CATHETERIZATION WITH CORONARY ANGIOGRAM N/A 12/15/2011   Procedure: LEFT HEART CATHETERIZATION WITH CORONARY ANGIOGRAM;  Surgeon: Lorretta Harp, MD;  Location: [facility] CATH LAB;  Service: Cardiovascular;  Laterality: N/A;  . LEFT HEART CATHETERIZATION WITH  CORONARY ANGIOGRAM N/A 09/05/2014   Procedure: LEFT HEART CATHETERIZATION WITH CORONARY ANGIOGRAM;  Surgeon: Burnell Blanks, MD;  Location: [facility] CATH LAB;  Service: Cardiovascular;  Laterality: N/A;  . LIPOMA EXCISION     back of the head  . NM MYOCAR PERF WALL MOTION  02/2012   lexiscan myoview; mild perfusion defect in mid inferolateral & basal inferolateral region (infarct/scar); EF 52%, abnormal but ow risk scan  . PERCUTANEOUS CORONARY STENT INTERVENTION (PCI-S)  09/05/2014   Procedure: PERCUTANEOUS CORONARY STENT INTERVENTION (PCI-S);  Surgeon: Burnell Blanks, MD;  Location: [facility] CATH LAB;  Service: Cardiovascular;;   Family History:  Family History  Problem Relation Age of Onset  . Leukemia Mother   . Prostate cancer Father   . Coronary artery disease Paternal Grandmother   . Cancer Paternal Grandfather   . Cancer Brother    Family Psychiatric  History: No new data shared  Social History:  Social History   Substance and Sexual Activity  Alcohol Use Yes     Social History   Substance and Sexual Activity  Drug Use No   Comment: 12/15/11 "last cocaine was 2010"    Social History   Socioeconomic History  .  Marital status: Divorced    Spouse name: Not on file  . Number of children: 2  . Years of education: GED  . Highest education level: Not on file  Occupational History  . Not on file  Tobacco Use  . Smoking status: Former Smoker    Packs/day: 1.00    Years: 10.00    Pack years: 10.00    Types: Cigarettes    Quit date: 10/07/2018    Years since quitting: 0.9  . Smokeless tobacco: Never Used  Substance and Sexual Activity  . Alcohol use: Yes  . Drug use: No    Comment: 12/15/11 "last cocaine was 2010"  . Sexual activity: Yes  Other Topics Concern  . Not on file  Social History Narrative  . Not on file   Social Determinants of Health   Financial Resource Strain:   . Difficulty of Paying Living Expenses:   Food Insecurity:   . Worried About  Charity fundraiser in the Last Year:   . Arboriculturist in the Last Year:   Transportation Needs:   . Film/video editor (Medical):   Marland Kitchen Lack of Transportation (Non-Medical):   Physical Activity:   . Days of Exercise per Week:   . Minutes of Exercise per Session:   Stress:   . Feeling of Stress :   Social Connections:   . Frequency of Communication with Friends and Family:   . Frequency of Social Gatherings with Friends and Family:   . Attends Religious Services:   . Active Member of Clubs or Organizations:   . Attends Archivist Meetings:   Marland Kitchen Marital Status:    Additional Social History:    Pain Medications: See MARs Prescriptions: See MARs Over the Counter: See MARs History of alcohol / drug use?: Yes Name of Substance 1: Alcohol 1 - Age of First Use: unknown 1 - Amount (size/oz): unknown 1 - Frequency: unknown 1 - Duration: unknown 1 - Last Use / Amount: August 2020  Sleep: Good  Appetite:  Fair  Current Medications: Current Facility-Administered Medications  Medication Dose Route Frequency Provider Last Rate Last Admin  . amLODipine (NORVASC) tablet 10 mg  10 mg Oral Daily Sharma Covert, MD   10 mg at 09/09/19 0915  . ARIPiprazole (ABILIFY) tablet 2 mg  2 mg Oral Daily Johnn Hai, MD   2 mg at 09/09/19 0913  . aspirin EC tablet 81 mg  81 mg Oral Daily Sharma Covert, MD   81 mg at 09/09/19 0914  . cloNIDine (CATAPRES) tablet 0.2 mg  0.2 mg Oral TID PRN Johnn Hai, MD   0.2 mg at 09/07/19 2127  . cloNIDine (CATAPRES) tablet 0.2 mg  0.2 mg Oral BID Johnn Hai, MD   0.2 mg at 09/09/19 0914  . famotidine (PEPCID) tablet 20 mg  20 mg Oral BID Sharma Covert, MD   20 mg at 09/09/19 0915  . fluticasone (FLONASE) 50 MCG/ACT nasal spray 2 spray  2 spray Each Nare Daily Sharma Covert, MD   2 spray at 09/09/19 [phone]  . isosorbide mononitrate (IMDUR) 24 hr tablet 60 mg  60 mg Oral Daily Sharma Covert, MD   60 mg at 09/09/19 0913  .  metFORMIN (GLUCOPHAGE) tablet 500 mg  500 mg Oral BID WC Sharma Covert, MD   500 mg at 09/09/19 0915  . mirtazapine (REMERON) tablet 15 mg  15 mg Oral QHS Sharma Covert, MD  15 mg at 09/09/19 0146  . nitroGLYCERIN (NITROSTAT) SL tablet 0.4 mg  0.4 mg Sublingual Q5 min PRN Sharma Covert, MD      . prenatal multivitamin tablet 1 tablet  1 tablet Oral Q1200 Johnn Hai, MD   1 tablet at 09/09/19 1333  . ticagrelor (BRILINTA) tablet 90 mg  90 mg Oral BID Sharma Covert, MD   90 mg at 09/09/19 0913  . traMADol (ULTRAM) tablet 50 mg  50 mg Oral Q6H PRN Sharma Covert, MD      . traZODone (DESYREL) tablet 150 mg  150 mg Oral QHS PRN Johnn Hai, MD   150 mg at 09/07/19 2124  . zolpidem (AMBIEN) tablet 10 mg  10 mg Oral QHS Johnn Hai, MD       Lab Results:  Results for orders placed or performed during the hospital encounter of 09/07/19 (from the past 48 hour(s))  CBC with Differential/Platelet     Status: Abnormal   Collection Time: 09/07/19  6:31 PM  Result Value Ref Range   WBC 5.7 4.0 - 10.5 K/uL   RBC 4.42 4.22 - 5.81 MIL/uL   Hemoglobin 12.7 (L) 13.0 - 17.0 g/dL   HCT 40.0 39.0 - 52.0 %   MCV 90.5 80.0 - 100.0 fL   MCH 28.7 26.0 - 34.0 pg   MCHC 31.8 30.0 - 36.0 g/dL   RDW 12.6 11.5 - 15.5 %   Platelets 220 150 - 400 K/uL   nRBC 0.0 0.0 - 0.2 %   Neutrophils Relative % 52 %   Neutro Abs 3.0 1.7 - 7.7 K/uL   Lymphocytes Relative 36 %   Lymphs Abs 2.1 0.7 - 4.0 K/uL   Monocytes Relative 8 %   Monocytes Absolute 0.5 0.1 - 1.0 K/uL   Eosinophils Relative 3 %   Eosinophils Absolute 0.2 0.0 - 0.5 K/uL   Basophils Relative 0 %   Basophils Absolute 0.0 0.0 - 0.1 K/uL   Immature Granulocytes 1 %   Abs Immature Granulocytes 0.03 0.00 - 0.07 K/uL    Comment: Performed at [facility], [place] [address], [place], [place] [address]  Comprehensive metabolic panel     Status: Abnormal   Collection Time: 09/07/19  6:31 PM  Result Value Ref Range    Sodium 142 135 - 145 mmol/L   Potassium 4.0 3.5 - 5.1 mmol/L   Chloride 110 98 - 111 mmol/L   CO2 24 22 - 32 mmol/L   Glucose, Bld 149 (H) 70 - 99 mg/dL    Comment: Glucose reference range applies only to samples taken after fasting for at least 8 hours.   BUN 15 6 - 20 mg/dL   Creatinine, Ser 0.76 0.61 - 1.24 mg/dL   Calcium 9.1 8.9 - 10.3 mg/dL   Total Protein 6.6 6.5 - 8.1 g/dL   Albumin 4.3 3.5 - 5.0 g/dL   AST 21 15 - 41 U/L   ALT 28 0 - 44 U/L   Alkaline Phosphatase 42 38 - 126 U/L   Total Bilirubin 0.5 0.3 - 1.2 mg/dL   GFR calc non Af Amer >60 >60 mL/min   GFR calc Af Amer >60 >60 mL/min   Anion gap 8 5 - 15    Comment: Performed at [facility], [place] [address] [place], [place] [address]  Hemoglobin A1c     Status: Abnormal   Collection Time: 09/07/19  6:31 PM  Result Value Ref Range  Hgb A1c MFr Bld 6.2 (H) 4.8 - 5.6 %    Comment: (NOTE) Pre diabetes:          5.7%-6.4% Diabetes:              >6.4% Glycemic control for   <7.0% adults with diabetes    Mean Plasma Glucose 131.24 mg/dL    Comment: Performed at [facility], [place] [address] [place], [place] [address]  TSH     Status: None   Collection Time: 09/07/19  6:31 PM  Result Value Ref Range   TSH 1.893 0.350 - 4.500 uIU/mL    Comment: Performed by a 3rd Generation assay with a functional sensitivity of <=0.01 uIU/mL. Performed at [facility], [place] [address], [place], [place] [address]   Glucose, capillary     Status: Abnormal   Collection Time: 09/08/19  5:45 AM  Result Value Ref Range   Glucose-Capillary 149 (H) 70 - 99 mg/dL    Comment: Glucose reference range applies only to samples taken after fasting for at least 8 hours.   Comment 1 Notify RN    Comment 2 Document in Chart   Ethanol     Status: None   Collection Time: 09/08/19  6:21 AM  Result Value Ref Range   Alcohol, Ethyl (B) <10 <10 mg/dL    Comment: (NOTE) Lowest detectable limit for serum  alcohol is 10 mg/dL. For medical purposes only. Performed at [facility], [place] [address], [place], [place] [address]   Lipid panel     Status: Abnormal   Collection Time: 09/08/19  6:21 AM  Result Value Ref Range   Cholesterol 183 0 - 200 mg/dL   Triglycerides 105 <150 mg/dL   HDL 52 >40 mg/dL   Total CHOL/HDL Ratio 3.5 RATIO   VLDL 21 0 - 40 mg/dL   LDL Cholesterol 110 (H) 0 - 99 mg/dL    Comment:        Total Cholesterol/HDL:CHD Risk Coronary Heart Disease Risk Table                     Men   Women  1/2 Average Risk   3.4   3.3  Average Risk       5.0   4.4  2 X Average Risk   9.6   7.1  3 X Average Risk  23.4   11.0        Use the calculated Patient Ratio above and the CHD Risk Table to determine the patient's CHD Risk.        ATP III CLASSIFICATION (LDL):  <100     mg/dL   Optimal  100-129  mg/dL   Near or Above                    Optimal  130-159  mg/dL   Borderline  160-189  mg/dL   High  >190     mg/dL   Very High Performed at [place] [address] [place], [place] [address]   TSH     Status: None   Collection Time: 09/08/19  6:21 AM  Result Value Ref Range   TSH 1.286 0.350 - 4.500 uIU/mL    Comment: Performed by a 3rd Generation assay with a functional sensitivity of <=0.01 uIU/mL. Performed at [facility], [place] [address], [place], [place] [address]   Glucose, capillary     Status: Abnormal  Collection Time: 09/09/19  6:11 AM  Result Value Ref Range   Glucose-Capillary 125 (H) 70 - 99 mg/dL    Comment: Glucose reference range applies only to samples taken after fasting for at least 8 hours.   Blood Alcohol level:  Lab Results  Component Value Date   ETH <10 09/08/2019   ETH <10 0000000   Metabolic Disorder Labs: Lab Results  Component Value Date   HGBA1C 6.2 (H) 09/07/2019   MPG 131.24 09/07/2019   MPG 142.72 10/25/2018   No results found for: PROLACTIN Lab Results   Component Value Date   CHOL 183 09/08/2019   TRIG 105 09/08/2019   HDL 52 09/08/2019   CHOLHDL 3.5 09/08/2019   VLDL 21 09/08/2019   LDLCALC 110 (H) 09/08/2019   LDLCALC 62 07/05/2019   Physical Findings: AIMS: Facial and Oral Movements Muscles of Facial Expression: None, normal Lips and Perioral Area: None, normal Jaw: None, normal Tongue: None, normal,Extremity Movements Upper (arms, wrists, hands, fingers): None, normal Lower (legs, knees, ankles, toes): None, normal, Trunk Movements Neck, shoulders, hips: None, normal, Overall Severity Severity of abnormal movements (highest score from questions above): None, normal Incapacitation due to abnormal movements: None, normal Patient's awareness of abnormal movements (rate only patient's report): No Awareness, Dental Status Current problems with teeth and/or dentures?: No Does patient usually wear dentures?: No  CIWA:    COWS:  COWS Total Score: 1  Musculoskeletal: Strength & Muscle Tone: within normal limits Gait & Station: normal Patient leans: N/A  Psychiatric Specialty Exam: Physical Exam  Nursing note and vitals reviewed. Constitutional: He is oriented to person, place, and time. He appears well-developed.  Cardiovascular:  Elevated blood pressure: 131/94  Respiratory: Effort normal.  Genitourinary:    Genitourinary Comments: Deferred   Musculoskeletal:        General: Normal range of motion.     Cervical back: Normal range of motion.  Neurological: He is alert and oriented to person, place, and time.  Skin: Skin is warm and dry.    Review of Systems  Constitutional: Negative for diaphoresis, fatigue and fever.  HENT: Negative for congestion, rhinorrhea, sneezing and sore throat.   Eyes: Negative for discharge.  Respiratory: Negative for cough, shortness of breath and wheezing.   Cardiovascular: Negative for chest pain and palpitations.  Gastrointestinal: Negative for diarrhea, nausea and vomiting.   Genitourinary: Negative for difficulty urinating.  Musculoskeletal: Positive for arthralgias and myalgias.  Skin: Negative for color change.  Allergic/Immunologic:       Allergies: Statins, Bee Vernom, Testosterone & Shell fish.  Neurological: Negative for dizziness.  Psychiatric/Behavioral: Positive for dysphoric mood (partial improvement") and sleep disturbance. Negative for agitation, behavioral problems, confusion, decreased concentration, hallucinations, self-injury and suicidal ideas. The patient is nervous/anxious. The patient is not hyperactive.     Blood pressure (!) 131/94, pulse 89, temperature (!) 97.3 F (36.3 C), temperature source Oral, resp. rate 16, height 5\' 8"  (1.727 m), weight 83.9 kg, SpO2 100 %.Body mass index is 28.13 kg/m.  General Appearance: Casual  Eye Contact:  Good  Speech:  Clear and Coherent  Volume:  Normal  Mood:  Dysphoric, "I'm no doing very well today.  Affect:  Congruent  Thought Process:  Coherent and Goal Directed  Orientation:  Full (Time, Place, and Person)  Thought Content:  Rumination  Suicidal Thoughts:  No,   Homicidal Thoughts:  No  Memory:  Immediate;   Fair Recent;   Fair Remote;   Fair  Judgement:  Fair  Insight:  Fair  Psychomotor Activity:  Normal  Concentration:  Concentration: Fair and Attention Span: Fair  Recall:  [employer] of Knowledge:  Fair  Language:  Good  Akathisia:  Negative  Handed:  Right  AIMS (if indicated):     Assets:  Communication Skills Physical Health Resilience Social Support  ADL's:  Intact  Cognition:  WNL  Sleep:  Number of Hours: 6   Treatment Plan Summary: Daily contact with patient to assess and evaluate symptoms and progress in treatment and Medication management.  - Continue inpatient hospitalization.  - Will continue today 09/09/2019 plan as below except where it is noted.  We will augment with aripiprazole 2 mg a day for mood control.  Continue cognitive therapy.  Continue  Mirtazapine 15 mg po daily for depression/insomnia.  Monitor through the weekend most likely.  No change in therapy focus or precautions.  Continue Catapres standing in addition to the Norvasc  10 mg Continue as needed Catapres as recommended.  Continue Pepcid 20 mg po Q am for GERD. Continue Mirtazapine 15 mg po Q hs for depression/insomnia. Continue Metformin 500 mg po bid for DM. Continue Imdur 60 mg po PAD. Continue Flonase for allergies. Continue ticagrelor 90 mg po bid for high cholesterol. Continue ASA 81 mg po daily for heart health. Continue Pepcid 20 mg po bid for GERD Continue Tramadol 50 mg po Q 6 hrs prn for pain. Continue Ambien 10 mg po Q hs for insomnia. Continue Trazodone 150 mg po prn for insomnia  Patient to participate in the group therapy. Discharge disposition in progress.  Lindell Spar, NP, PMHNP, FNP-BC 09/09/2019, 1:50 PMPatient ID: Lawson Fiscal, male   DOB: 1961/04/18, 59 y.o.   MRN: [id number]

## 2019-09-09 NOTE — Progress Notes (Signed)
   09/09/19 2320  Psych Admission Type (Psych Patients Only)  Admission Status Voluntary  Psychosocial Assessment  Patient Complaints None  Eye Contact Brief  Facial Expression Flat  Affect Appropriate to circumstance  Speech Logical/coherent  Interaction Minimal  Motor Activity Other (Comment) (WDL)  Appearance/Hygiene Unremarkable  Behavior Characteristics Appropriate to situation  Mood Depressed  Thought Process  Coherency WDL  Content WDL  Delusions None reported or observed  Perception WDL  Hallucination None reported or observed  Judgment Impaired  Confusion None  Danger to Self  Current suicidal ideation? Denies  Self-Injurious Behavior No self-injurious ideation or behavior indicators observed or expressed   Agreement Not to Harm Self Yes  Description of Agreement verbally contracts for safety  Danger to Others  Danger to Others None reported or observed

## 2019-09-09 NOTE — Progress Notes (Signed)
[facility] Notes:  (Nursing/MHT/Case Management/Adjunct)  Date:  09/09/2019  Time: 2030 Type of Therapy:  wrap up group  Participation Level:  Active  Participation Quality:  Appropriate, Attentive, Sharing and Supportive  Affect:  Depressed  Cognitive:  Appropriate  Insight:  Improving  Engagement in Group:  Engaged  Modes of Intervention:  Clarification, Education and Support  Summary of Progress/Problems: Positive thinking and positive change were discussed.   Shellia Cleverly 09/09/2019, 9:02 PM

## 2019-09-09 NOTE — Progress Notes (Signed)
   09/09/19 1700  Psych Admission Type (Psych Patients Only)  Admission Status Voluntary  Psychosocial Assessment  Patient Complaints Anxiety  Eye Contact Brief  Facial Expression Flat  Affect Flat  Speech Logical/coherent  Interaction Minimal  Motor Activity Other (Comment) (WNL)  Appearance/Hygiene Unremarkable  Behavior Characteristics Cooperative;Appropriate to situation  Mood Anxious  Aggressive Behavior  Effect No apparent injury  Thought Process  Coherency WDL  Content WDL  Delusions None reported or observed  Perception WDL  Hallucination None reported or observed  Judgment WDL  Confusion None  Danger to Self  Current suicidal ideation? Denies  Self-Injurious Behavior No self-injurious ideation or behavior indicators observed or expressed   Agreement Not to Harm Self Yes  Description of Agreement verbally contracts for safety  Danger to Others  Danger to Others None reported or observed

## 2019-09-09 NOTE — [facility] Group Notes (Signed)
LCSW Group Therapy Note  09/09/2019    10:00-11:00am   Type of Therapy and Topic:  Group Therapy: Early Messages Received About Anger  Participation Level:  Active   Description of Group:   In this group, patients shared and discussed the early messages received in their lives about anger through parental or other adult modeling, teaching, repression, punishment, violence, and more.  Participants identified how those childhood lessons influence even now how they usually or often react when angered.  The group discussed that anger is a secondary emotion and what may be the underlying emotional themes that come out through anger outbursts or that are ignored through anger suppression.  Finally, as a group there was a conversation about the workbook's quote that "There is nothing wrong with anger; it is just a sign something needs to change."     Therapeutic Goals: Patients will identify one or more childhood message about anger that they received and how it was taught to them. Patients will discuss how these childhood experiences have influenced and continue to influence their own expression or repression of anger even today. Patients will explore possible primary emotions that tend to fuel their secondary emotion of anger. Patients will learn that anger itself is normal and cannot be eliminated, and that healthier coping skills can assist with resolving conflict rather than worsening situations.  Summary of Patient Progress:  The patient shared that his childhood lessons about anger were not only from his family, but also from his school and neighborhood.  He learned that anger required revenge in a passive-aggressive manner.  As a result, he still deals with anger the same way.  He stated he is trying to study the concept of humility currently and the group expressed their views on what that means.  Therapeutic Modalities:   Cognitive Behavioral Therapy Motivation Interviewing  Maretta Los  .

## 2019-09-10 LAB — GLUCOSE, CAPILLARY: Glucose-Capillary: 128 mg/dL — ABNORMAL HIGH (ref 70–99)

## 2019-09-10 NOTE — Progress Notes (Signed)
D:  Patient denied SI and HI, contracts for safety.  Denied A/V hallucinations.  Denied pain. A:  Medications administered per MD orders.  Emotional support and encouragement given patient. R:  Safety maintained with 15 minute checks.  

## 2019-09-10 NOTE — Plan of Care (Signed)
Nurse discussed anxiety, depression and coping skills with patient.  

## 2019-09-10 NOTE — Progress Notes (Signed)
Patient participated in music therapy group by sharing favorite song lyrics and watching live [place] performances. 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  Patients discussed how music can calming and motivating.

## 2019-09-10 NOTE — [facility] Group Notes (Signed)
[place] [facility] LCSW Group Therapy Note  09/10/2019    Type of Therapy and Topic:  Group Therapy:  Adding Supports Including Yourself  Participation Level:  Did Not Attend   Description of Group:   Patients in this group were introduced to the concept that additional supports including self-support are an essential part of recovery.  Patients listed what supports they believe they need to add to their lives to achieve their goals at discharge, and they listed such things as therapist, family, doctor, support groups, 12-step groups and service animals.   A song entitled "My Own Hero" was played and a group discussion ensued in which patients stated they could relate to the song and it inspired them to realize they have be willing to help themselves in order to succeed, because other people cannot achieve sobriety or stability for them.  "Fight For It" was played, then "I Am Enough" to encourage patients.  They discussed the impact on them and how they must remain convinced that their lives are worth the effort it takes to become sober and/or stable.  Therapeutic Goals: 1)  demonstrate the importance of being a key part of one's own support system 2)  discuss various available supports 3)  encourage patient to use music as part of their self-support and focus on goals 4)  elicit ideas from patients about supports that need to be added   Summary of Patient Progress:  The patient arrived to group with only 5 minutes left, but was immediately called out to see a provider.   Therapeutic Modalities:   Motivational Interviewing Activity  Berlin Hun Grossman-Orr  8:52 AM

## 2019-09-10 NOTE — Progress Notes (Signed)
[facility] MD Progress Note  09/10/2019 11:48 AM [name]  MRN:  [id number]  Subjective: [name] reports, "I'm doing a lot better today. I slept well last night with medications, of course. But, I still have some dizziness when I get out of bed & in a standing position, I think may be coming from my medicines".  This is a repeat admission for [name] is a 59 year old individual with recurrent and severe depression, presenting approximately 6 months after his last discharge, reporting suicidal thoughts and noncompliance with medications approximately 4 months. The patient elaborates that he tosses and turns at night he continues to have negative ruminations and further believes that the previous medication he was on was helpful "at a lower dose" what he is referring to is why we lowered his aripiprazole augmentation to just 2 mg a day that he found very helpful. He engages in cognitive therapy he denies thoughts of harming self today but can contract for safety here only. No involuntary movements- [name] is seen, chart reviewed. The chart findings discussed with the treatment team. He presents alert, oriented & aware of situations. He is visible on the unit, attending group sessions. He is taking & tolerating his medications, complains of some dizziness he thought may be coming from his medicines. He says he is willing to wait for his body to get use to his medicines again. He is instructed to be careful when changing from lying/sitting position to standing position. He is in agreement. He says he is doing better today. He rates depressive symptoms at #5 today. However, he says he is trying to be optimistic about his recovery since he is back on his medications again. He said yesterday that he went for several months without his medications. He rates his anxiety at #4 in the scale (1-10), where 1 is the least depressed & 10 the worst. He is taking & tolerating his medications. He denies any SIHI, AVH,  delusional thoughts or paranoia. He does not appear to be responding to internal stimuli. [name] is agreement to continue his current plan of care as already in progress.   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 Allergies   . Arthritis   . Chest pain   . Chronic lower back pain   . Chronic pain of right wrist   . Coronary artery disease    a. Multiple prior caths/PCI. Cath 2013 with possible spasm of RCA, 70% ISR of mid LCx with subsequent DES to mLCx and prox LCX. b. H/o microvascular angina. c. Recurrent angina 08/2014 - s/p PTCA/DES to prox Cx, PTCA/CBA to OM1.  c. LHC 06/10/15 with patent stents and some ISR in LCX and OM-1 that was not flow limiting --> Rx   . Dyslipidemia    a. Intolerant to many statins except tolerating Livalo.  Marland Kitchen GERD (gastroesophageal reflux disease)   . H/O cardiac catheterization 10/25/2018  . Hypertension   . Myocardial infarction ([place]) ~ 2010  . S/P angioplasty with stent, DES, to proximal and mid LCX 12/15/11 12/15/2011  . Shoulder pain   . Stroke [facility])    pt. reports had a stroke around time of MI 2010  .  Type II diabetes mellitus ([place])   . Unstable angina [facility])     Past Surgical History:  Procedure Laterality Date  . CARDIAC CATHETERIZATION  06/15/2002   LAD with prox 40% stenosis, norma L main, Cfx with 25% lesion, RCA with long mid 25% stenosis (Dr. Vita Barley)  . CARDIAC CATHETERIZATION  04/01/2010   normal L main, LAD wit mild stenosis, L Cfx with 70% in-stent restenosis, RCA with 70% in-stent restenosis, LVEF >60% (Dr. K. Mali Hilty) - cutting ballon arthrectomy to RCA & Cfx (Dr. Rockne Menghini)  . CARDIAC CATHETERIZATION  08/25/2010   preserved global LV contractility; multivessel CAD, diffuse 90-95% in-stent restenosis in prox placed Cfx stent - cutting balloon arthrectomy in Cfx with multiple dilatations 90-95% to 0% stenosis (Dr. Corky Downs)  . CARDIAC CATHETERIZATION  01/26/2011   PCI & stenting of aggresive in-stent restenosis within previously stented AV groove Cfx with 3.0x40mm Taxus DES (previous stents were Promus) (Dr. Adora Fridge)  . CARDIAC CATHETERIZATION  05/11/2011   preserved LV function, 40% mid LAD stenosis, 30-40% narrowing proximal to stented semgnet of prox Cfx, patent mid RCA stent with smooth 20% narrowing in distal RCA (Dr. Corky Downs)  . CARDIAC CATHETERIZATION  12/15/2011   PCI & stenting of proximal & mid Cfx with DES - 3.0x50mm in proximal, 3.0x65mm in mid (Dr. Adora Fridge)  . CARDIAC CATHETERIZATION N/A 06/10/2015   Procedure: Left Heart Cath and Coronary Angiography;  Surgeon: Jolaine Artist, MD;  Location: [place] CV LAB;  Service: Cardiovascular;  Laterality: N/A;  . CARDIAC CATHETERIZATION  10/25/2018  . cardiometabolic testing  123456   good exercise effort, peak VO2 79% predicted with normal VO2 HR curves (mild deconditioning)  . COLONOSCOPY  12/2012   diminutive hyperplastic sigmoid poyp so repeat routine 2024  . CORONARY BALLOON ANGIOPLASTY N/A 10/25/2018   Procedure: CORONARY BALLOON ANGIOPLASTY;  Surgeon: Jettie Booze, MD;  Location: [place] CV LAB;  Service: Cardiovascular;  Laterality: N/A;  . EXCISIONAL HEMORRHOIDECTOMY  1984  . LEFT HEART CATH AND CORONARY ANGIOGRAPHY N/A 10/25/2018   Procedure: LEFT HEART CATH AND CORONARY ANGIOGRAPHY;  Surgeon: Jettie Booze, MD;  Location: [place] CV LAB;  Service: Cardiovascular;  Laterality: N/A;  . LEFT HEART CATHETERIZATION WITH CORONARY ANGIOGRAM N/A 05/11/2011   Procedure: LEFT HEART CATHETERIZATION WITH CORONARY ANGIOGRAM;  Surgeon: Troy Sine, MD;  Location: [facility] CATH  LAB;  Service: Cardiovascular;  Laterality: N/A;  Possible percutaneous coronary intervention, possible IVUS  . LEFT HEART CATHETERIZATION WITH CORONARY ANGIOGRAM N/A 12/15/2011   Procedure: LEFT HEART CATHETERIZATION WITH CORONARY ANGIOGRAM;  Surgeon: Lorretta Harp, MD;  Location[facility] CATH LAB;  Service: Cardiovascular;  Laterality: N/A;  . LEFT HEART CATHETERIZATION WITH CORONARY ANGIOGRAM N/A 09/05/2014   Procedure: LEFT HEART CATHETERIZATION WITH CORONARY ANGIOGRAM;  Surgeon: Burnell Blanks, MD;  Location: [facility] CATH LAB;  Service: Cardiovascular;  Laterality: N/A;  . LIPOMA EXCISION     back of the head  . NM MYOCAR PERF WALL MOTION  02/2012   lexiscan myoview; mild perfusion defect in mid inferolateral & basal inferolateral region (infarct/scar); EF 52%, abnormal but ow risk scan  . PERCUTANEOUS CORONARY STENT INTERVENTION (PCI-S)  09/05/2014   Procedure: PERCUTANEOUS CORONARY STENT INTERVENTION (PCI-S);  Surgeon: Burnell Blanks, MD;  Location: [facility] CATH LAB;  Service: Cardiovascular;;   Family History:  Family History  Problem Relation Age of Onset  . Leukemia Mother   . Prostate cancer Father   . Coronary artery  disease Paternal Grandmother   . Cancer Paternal Grandfather   . Cancer Brother    Family Psychiatric  History: No new data shared  Social History:  Social History   Substance and Sexual Activity  Alcohol Use Yes     Social History   Substance and Sexual Activity  Drug Use No   Comment: 12/15/11 "last cocaine was 2010"    Social History   Socioeconomic History  . Marital status: Divorced    Spouse name: Not on file  . Number of children: 2  . Years of education: GED  . Highest education level: Not on file  Occupational History  . Not on file  Tobacco Use  . Smoking status: Former Smoker    Packs/day: 1.00    Years: 10.00    Pack years: 10.00    Types: Cigarettes    Quit date: 10/07/2018    Years since quitting: 0.9  . Smokeless tobacco: Never  Used  Substance and Sexual Activity  . Alcohol use: Yes  . Drug use: No    Comment: 12/15/11 "last cocaine was 2010"  . Sexual activity: Yes  Other Topics Concern  . Not on file  Social History Narrative  . Not on file   Social Determinants of Health   Financial Resource Strain:   . Difficulty of Paying Living Expenses:   Food Insecurity:   . Worried About Charity fundraiser in the Last Year:   . Arboriculturist in the Last Year:   Transportation Needs:   . Film/video editor (Medical):   Marland Kitchen Lack of Transportation (Non-Medical):   Physical Activity:   . Days of Exercise per Week:   . Minutes of Exercise per Session:   Stress:   . Feeling of Stress :   Social Connections:   . Frequency of Communication with Friends and Family:   . Frequency of Social Gatherings with Friends and Family:   . Attends Religious Services:   . Active Member of Clubs or Organizations:   . Attends Archivist Meetings:   Marland Kitchen Marital Status:    Additional Social History:    Pain Medications: See MARs Prescriptions: See MARs Over the Counter: See MARs History of alcohol / drug use?: Yes Name of Substance 1: Alcohol 1 - Age of First Use: unknown 1 - Amount (size/oz): unknown 1 - Frequency: unknown 1 - Duration: unknown 1 - Last Use / Amount: August 2020  Sleep: Good  Appetite:  Fair  Current Medications: Current Facility-Administered Medications  Medication Dose Route Frequency Provider Last Rate Last Admin  . amLODipine (NORVASC) tablet 10 mg  10 mg Oral Daily Sharma Covert, MD   10 mg at 09/10/19 0840  . ARIPiprazole (ABILIFY) tablet 2 mg  2 mg Oral Daily Johnn Hai, MD   2 mg at 09/10/19 0839  . aspirin EC tablet 81 mg  81 mg Oral Daily Sharma Covert, MD   81 mg at 09/10/19 0839  . cloNIDine (CATAPRES) tablet 0.2 mg  0.2 mg Oral TID PRN Johnn Hai, MD   0.2 mg at 09/07/19 2127  . cloNIDine (CATAPRES) tablet 0.2 mg  0.2 mg Oral BID Johnn Hai, MD   0.2 mg at  09/10/19 0839  . famotidine (PEPCID) tablet 20 mg  20 mg Oral BID Sharma Covert, MD   20 mg at 09/10/19 0839  . fluticasone (FLONASE) 50 MCG/ACT nasal spray 2 spray  2 spray Each Nare Daily Sharma Covert,  MD   2 spray at 09/10/19 0840  . isosorbide mononitrate (IMDUR) 24 hr tablet 60 mg  60 mg Oral Daily Sharma Covert, MD   60 mg at 09/10/19 0840  . metFORMIN (GLUCOPHAGE) tablet 500 mg  500 mg Oral BID WC Sharma Covert, MD   500 mg at 09/10/19 0839  . mirtazapine (REMERON) tablet 15 mg  15 mg Oral QHS Sharma Covert, MD   15 mg at 09/09/19 2323  . nitroGLYCERIN (NITROSTAT) SL tablet 0.4 mg  0.4 mg Sublingual Q5 min PRN Sharma Covert, MD      . prenatal multivitamin tablet 1 tablet  1 tablet Oral Q1200 Johnn Hai, MD   1 tablet at 09/09/19 1333  . ticagrelor (BRILINTA) tablet 90 mg  90 mg Oral BID Sharma Covert, MD   90 mg at 09/10/19 0839  . traMADol (ULTRAM) tablet 50 mg  50 mg Oral Q6H PRN Sharma Covert, MD      . traZODone (DESYREL) tablet 150 mg  150 mg Oral QHS PRN Johnn Hai, MD   150 mg at 09/07/19 2124  . zolpidem (AMBIEN) tablet 10 mg  10 mg Oral QHS Johnn Hai, MD   10 mg at 09/09/19 2323   Lab Results:  Results for orders placed or performed during the hospital encounter of 09/07/19 (from the past 48 hour(s))  Glucose, capillary     Status: Abnormal   Collection Time: 09/09/19  6:11 AM  Result Value Ref Range   Glucose-Capillary 125 (H) 70 - 99 mg/dL    Comment: Glucose reference range applies only to samples taken after fasting for at least 8 hours.  Glucose, capillary     Status: Abnormal   Collection Time: 09/10/19  5:49 AM  Result Value Ref Range   Glucose-Capillary 128 (H) 70 - 99 mg/dL    Comment: Glucose reference range applies only to samples taken after fasting for at least 8 hours.   Comment 1 Notify RN    Comment 2 Document in Chart    Blood Alcohol level:  Lab Results  Component Value Date   ETH <10 09/08/2019   ETH  <10 0000000   Metabolic Disorder Labs: Lab Results  Component Value Date   HGBA1C 6.2 (H) 09/07/2019   MPG 131.24 09/07/2019   MPG 142.72 10/25/2018   No results found for: PROLACTIN Lab Results  Component Value Date   CHOL 183 09/08/2019   TRIG 105 09/08/2019   HDL 52 09/08/2019   CHOLHDL 3.5 09/08/2019   VLDL 21 09/08/2019   LDLCALC 110 (H) 09/08/2019   LDLCALC 62 07/05/2019   Physical Findings: AIMS: Facial and Oral Movements Muscles of Facial Expression: None, normal Lips and Perioral Area: None, normal Jaw: None, normal Tongue: None, normal,Extremity Movements Upper (arms, wrists, hands, fingers): None, normal Lower (legs, knees, ankles, toes): None, normal, Trunk Movements Neck, shoulders, hips: None, normal, Overall Severity Severity of abnormal movements (highest score from questions above): None, normal Incapacitation due to abnormal movements: None, normal Patient's awareness of abnormal movements (rate only patient's report): No Awareness, Dental Status Current problems with teeth and/or dentures?: No Does patient usually wear dentures?: No  CIWA:    COWS:  COWS Total Score: 1  Musculoskeletal: Strength & Muscle Tone: within normal limits Gait & Station: normal Patient leans: N/A  Psychiatric Specialty Exam: Physical Exam  Nursing note and vitals reviewed. Constitutional: He is oriented to person, place, and time. He appears well-developed.  Cardiovascular:  Elevated blood pressure: 131/94  Respiratory: Effort normal.  Genitourinary:    Genitourinary Comments: Deferred   Musculoskeletal:        General: Normal range of motion.     Cervical back: Normal range of motion.  Neurological: He is alert and oriented to person, place, and time.  Skin: Skin is warm and dry.    Review of Systems  Constitutional: Negative for diaphoresis, fatigue and fever.  HENT: Negative for congestion, rhinorrhea, sneezing and sore throat.   Eyes: Negative for  discharge.  Respiratory: Negative for cough, shortness of breath and wheezing.   Cardiovascular: Negative for chest pain and palpitations.  Gastrointestinal: Negative for diarrhea, nausea and vomiting.  Genitourinary: Negative for difficulty urinating.  Musculoskeletal: Positive for arthralgias and myalgias.  Skin: Negative for color change.  Allergic/Immunologic:       Allergies: Statins, Bee Vernom, Testosterone & Shell fish.  Neurological: Negative for dizziness.  Psychiatric/Behavioral: Positive for dysphoric mood (partial improvement") and sleep disturbance. Negative for agitation, behavioral problems, confusion, decreased concentration, hallucinations, self-injury and suicidal ideas. The patient is nervous/anxious. The patient is not hyperactive.     Blood pressure 118/88, pulse 79, temperature (!) 97.3 F (36.3 C), temperature source Oral, resp. rate 16, height 5\' 8"  (1.727 m), weight 83.9 kg, SpO2 100 %.Body mass index is 28.13 kg/m.  General Appearance: Casual  Eye Contact:  Good  Speech:  Clear and Coherent  Volume:  Normal  Mood:  Dysphoric, "I'm no doing very well today.  Affect:  Congruent  Thought Process:  Coherent and Goal Directed  Orientation:  Full (Time, Place, and Person)  Thought Content:  Rumination  Suicidal Thoughts:  No,   Homicidal Thoughts:  No  Memory:  Immediate;   Fair Recent;   Fair Remote;   Fair  Judgement:  Fair  Insight:  Fair  Psychomotor Activity:  Normal  Concentration:  Concentration: Fair and Attention Span: Fair  Recall:  [employer] of Knowledge:  Fair  Language:  Good  Akathisia:  Negative  Handed:  Right  AIMS (if indicated):     Assets:  Communication Skills Physical Health Resilience Social Support  ADL's:  Intact  Cognition:  WNL  Sleep:  Number of Hours: 3.5   Treatment Plan Summary: Daily contact with patient to assess and evaluate symptoms and progress in treatment and Medication management.  - Continue inpatient  hospitalization.  - Will continue today 09/10/2019 plan as below except where it is noted.  We will augment with aripiprazole 2 mg a day for mood control.  Continue cognitive therapy.  Continue Mirtazapine 15 mg po daily for depression/insomnia.  Monitor through the weekend most likely.  No change in therapy focus or precautions.  Continue Catapres standing in addition to the Norvasc  10 mg Continue as needed Catapres as recommended.  Continue Pepcid 20 mg po Q am for GERD. Continue Mirtazapine 15 mg po Q hs for depression/insomnia. Continue Metformin 500 mg po bid for DM. Continue Imdur 60 mg po PAD. Continue Flonase for allergies. Continue ticagrelor 90 mg po bid for high cholesterol. Continue ASA 81 mg po daily for heart health. Continue Pepcid 20 mg po bid for GERD Continue Tramadol 50 mg po Q 6 hrs prn for pain. Continue Ambien 10 mg po Q hs for insomnia. Continue Trazodone 150 mg po prn for insomnia  Patient to participate in the group therapy. Discharge disposition in progress.  Lindell Spar, NP, PMHNP, FNP-BC 09/10/2019, 11:48 AMPatient ID: [name],  male   DOB: 1960/11/30, 59 y.o.   MRN: [id number] Patient ID: [name], male   DOB: Aug 25, 1960, 59 y.o.   MRN: [id number]

## 2019-09-11 LAB — GLUCOSE, CAPILLARY: Glucose-Capillary: 162 mg/dL — ABNORMAL HIGH (ref 70–99)

## 2019-09-11 MED ORDER — MIRTAZAPINE 15 MG PO TABS: 15.0000 mg | ORAL_TABLET | Freq: Every day | ORAL | 1 refills | Status: DC

## 2019-09-11 MED ORDER — FAMOTIDINE 20 MG PO TABS: 20.0000 mg | ORAL_TABLET | Freq: Two times a day (BID) | ORAL | 2 refills | Status: DC

## 2019-09-11 MED ORDER — VORTIOXETINE HBR 10 MG PO TABS: 10.0000 mg | ORAL_TABLET | Freq: Every day | ORAL | 1 refills | Status: DC

## 2019-09-11 MED ORDER — ARIPIPRAZOLE 2 MG PO TABS: 2.0000 mg | ORAL_TABLET | Freq: Every day | ORAL | 1 refills | Status: DC

## 2019-09-11 NOTE — Progress Notes (Signed)
Discharge Note:  Patient discharged home. Denied SI and HI.  Denied A/V hallucinations.  Denied pain.  Suicide prevention information given and discussed with patient who stated he understood and had no questions.  Patient stated he received all his belongings, clothing, toiletries, misc items, etc.  Patient stated he appreciated all assistance received from [facility] staff.  All required discharge information given to patient at discharge.

## 2019-09-11 NOTE — Discharge Summary (Signed)
Physician Discharge Summary Note  Patient:  [name] is an 59 y.o., male MRN:  [id number] DOB:  03-11-1961 Patient phone:  [phone] (home)  Patient address:   [place] [address],  Total Time spent with patient: 45 minutes  Date of Admission:  09/07/2019 Date of Discharge: 09/11/2019  Reason for Admission:   Mr. Blayney was seen on the 200 ward/observation [place], and it was determined that he could benefit from inpatient stabilization.   This is a repeat admission for him, last discharge 6 months ago on 01/16/2019 with a similar presentation upon that admission  On this presentation, he reported an exacerbation and multiple depressive symptoms, suicidal thoughts, and 2 prior attempts at suicide.  He had been off of medication approximately 4 months and believes he needs to get back on his antidepressant medication.  The patient is disabled and widowed, he has a local address, he reports his self-care has deteriorated.  He is cordial alert oriented and cooperative with thoughts of wanting to die without a specific plan can contract for safety here only.  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 seeking treatment due to increased depression and suicidal ideations.Pt states, "I need to get back on my medication for clinical depression. I stopped taking my medicine 4 months ago and lately I have gotten to the point where I don't want to take any of my medications. I have episodes of not being able to do anything and I don't want to get out of bed." Pt reports having a history of multiple suicide attempts. Pt states, "I tried to kill myself 2 times. I drove my car off the bridge and I took a lot of medicine at one time." Pt admits to being  an alcoholic. Pt states, "I haven't had a drink of alcohol since Aug 2020." Pt could notdeny or confirm if he was havingHI but hung his head down when asked if he washaving thoughts of wanting to hurt others. Pt denies A/V-hallucinations.   Pt is a disabled widow who has resided alone since May 2019. Pt has a history of multiple hospitalizations due to MH/SA treatment at different facilities. Pt is currently being treated outpatient at [facility] for counseling monthly. Pt is not longer receives medication management at [facility] because he refuse to take the medication.  Patient was wearingdark clothesandsmelled as if he had not showered. Pt was alert throughout the assessment. Patient madepoor eye contact and had normal psychomotor activity. Patient spoke in a normal voice without pressured speech. Pt expressed feelingsad. Pt's affect appeared dysphoric and congruent with stated mood. Pt's thought process wascoherent and logical.Pt presented withpartialinsight and judgement. Pt did not appear to be responding to internal stimuli. Pt was not able to contract for safety. Principal Problem: <principal problem not specified> Discharge Diagnoses: Active Problems:   MDD (major depressive disorder), single episode, severe with psychotic features ([place])   MDD (major depressive disorder)   Past Psychiatric History: see eval  Past Medical History:  Past Medical History:  Diagnosis Date  . Allergies   . Arthritis   . Chest pain   . Chronic lower back pain   . Chronic pain of right wrist   . Coronary artery disease    a. Multiple prior caths/PCI.  Cath 2013 with possible spasm of RCA, 70% ISR of mid LCx with subsequent DES to mLCx and prox LCX. b. H/o microvascular angina. c. Recurrent angina 08/2014 - s/p PTCA/DES to prox Cx, PTCA/CBA to OM1.  c. LHC 06/10/15 with patent stents and some ISR in LCX and OM-1 that was not flow limiting --> Rx   . Dyslipidemia    a. Intolerant to  many statins except tolerating Livalo.  Marland Kitchen GERD (gastroesophageal reflux disease)   . H/O cardiac catheterization 10/25/2018  . Hypertension   . Myocardial infarction ([place]) ~ 2010  . S/P angioplasty with stent, DES, to proximal and mid LCX 12/15/11 12/15/2011  . Shoulder pain   . Stroke [facility])    pt. reports had a stroke around time of MI 2010  . Type II diabetes mellitus ([place])   . Unstable angina [facility])     Past Surgical History:  Procedure Laterality Date  . CARDIAC CATHETERIZATION  06/15/2002   LAD with prox 40% stenosis, norma L main, Cfx with 25% lesion, RCA with long mid 25% stenosis (Dr. Vita Barley)  . CARDIAC CATHETERIZATION  04/01/2010   normal L main, LAD wit mild stenosis, L Cfx with 70% in-stent restenosis, RCA with 70% in-stent restenosis, LVEF >60% (Dr. K. Mali Hilty) - cutting ballon arthrectomy to RCA & Cfx (Dr. Rockne Menghini)  . CARDIAC CATHETERIZATION  08/25/2010   preserved global LV contractility; multivessel CAD, diffuse 90-95% in-stent restenosis in prox placed Cfx stent - cutting balloon arthrectomy in Cfx with multiple dilatations 90-95% to 0% stenosis (Dr. Corky Downs)  . CARDIAC CATHETERIZATION  01/26/2011   PCI & stenting of aggresive in-stent restenosis within previously stented AV groove Cfx with 3.0x49mm Taxus DES (previous stents were Promus) (Dr. Adora Fridge)  . CARDIAC CATHETERIZATION  05/11/2011   preserved LV function, 40% mid LAD stenosis, 30-40% narrowing proximal to stented semgnet of prox Cfx, patent mid RCA stent with smooth 20% narrowing in distal RCA (Dr. Corky Downs)  . CARDIAC CATHETERIZATION  12/15/2011   PCI & stenting of proximal & mid Cfx with DES - 3.0x49mm in proximal, 3.0x97mm in mid (Dr. Adora Fridge)  . CARDIAC CATHETERIZATION N/A 06/10/2015   Procedure: Left Heart Cath and Coronary Angiography;  Surgeon: Jolaine Artist, MD;  Location: [place] [facility];  Service: Cardiovascular;  Laterality: N/A;  . CARDIAC CATHETERIZATION  10/25/2018  . cardiometabolic  testing  123456   good exercise effort, peak VO2 79% predicted with normal VO2 HR curves (mild deconditioning)  . COLONOSCOPY  12/2012   diminutive hyperplastic sigmoid poyp so repeat routine 2024  . CORONARY BALLOON ANGIOPLASTY N/A 10/25/2018   Procedure: CORONARY BALLOON ANGIOPLASTY;  Surgeon: Jettie Booze, MD;  Location: [place] [facility];  Service: Cardiovascular;  Laterality: N/A;  . EXCISIONAL HEMORRHOIDECTOMY  1984  . LEFT HEART CATH AND CORONARY ANGIOGRAPHY N/A 10/25/2018   Procedure: LEFT HEART CATH AND CORONARY ANGIOGRAPHY;  Surgeon: Jettie Booze, MD;  Location: [place] [facility];  Service: Cardiovascular;  Laterality: N/A;  . LEFT HEART CATHETERIZATION WITH CORONARY ANGIOGRAM N/A 05/11/2011   Procedure: LEFT HEART CATHETERIZATION WITH CORONARY ANGIOGRAM;  Surgeon: Troy Sine, MD;  Location[facility] C[facility] LAB;  Service: Cardiovascular;  Laterality: N/A;  Possible percutaneous coronary intervention, possible IVUS  . LEFT HEART CATHETERIZATION WITH CORONARY ANGIOGRAM N/A 12/15/2011   Procedure: LEFT HEART CATHETERIZATION WITH CORONARY ANGIOGRAM;  Surgeon: Lorretta Harp, MD;  Location: [facility] CATH LAB;  Service: Cardiovascular;  Laterality: N/A;  . LEFT  HEART CATHETERIZATION WITH CORONARY ANGIOGRAM N/A 09/05/2014   Procedure: LEFT HEART CATHETERIZATION WITH CORONARY ANGIOGRAM;  Surgeon: Burnell Blanks, MD;  Location: [facility] CATH LAB;  Service: Cardiovascular;  Laterality: N/A;  . LIPOMA EXCISION     back of the head  . NM MYOCAR PERF WALL MOTION  02/2012   lexiscan myoview; mild perfusion defect in mid inferolateral & basal inferolateral region (infarct/scar); EF 52%, abnormal but ow risk scan  . PERCUTANEOUS CORONARY STENT INTERVENTION (PCI-S)  09/05/2014   Procedure: PERCUTANEOUS CORONARY STENT INTERVENTION (PCI-S);  Surgeon: Burnell Blanks, MD;  Location: [facility] CATH LAB;  Service: Cardiovascular;;   Family History:  Family History  Problem Relation Age of Onset   . Leukemia Mother   . Prostate cancer Father   . Coronary artery disease Paternal Grandmother   . Cancer Paternal Grandfather   . Cancer Brother    Family Psychiatric  History: see eval Social History:  Social History   Substance and Sexual Activity  Alcohol Use Yes     Social History   Substance and Sexual Activity  Drug Use No   Comment: 12/15/11 "last cocaine was 2010"    Social History   Socioeconomic History  . Marital status: Divorced    Spouse name: Not on file  . Number of children: 2  . Years of education: GED  . Highest education level: Not on file  Occupational History  . Not on file  Tobacco Use  . Smoking status: Former Smoker    Packs/day: 1.00    Years: 10.00    Pack years: 10.00    Types: Cigarettes    Quit date: 10/07/2018    Years since quitting: 0.9  . Smokeless tobacco: Never Used  Substance and Sexual Activity  . Alcohol use: Yes  . Drug use: No    Comment: 12/15/11 "last cocaine was 2010"  . Sexual activity: Yes  Other Topics Concern  . Not on file  Social History Narrative  . Not on file   Social Determinants of Health   Financial Resource Strain:   . Difficulty of Paying Living Expenses:   Food Insecurity:   . Worried About Charity fundraiser in the Last Year:   . Arboriculturist in the Last Year:   Transportation Needs:   . Film/video editor (Medical):   Marland Kitchen Lack of Transportation (Non-Medical):   Physical Activity:   . Days of Exercise per Week:   . Minutes of Exercise per Session:   Stress:   . Feeling of Stress :   Social Connections:   . Frequency of Communication with Friends and Family:   . Frequency of Social Gatherings with Friends and Family:   . Attends Religious Services:   . Active Member of Clubs or Organizations:   . Attends Archivist Meetings:   Marland Kitchen Marital Status:     Hospital Course:    Patient again was admitted to the 300 ward he participated well and displayed no dangerous behaviors.  He  had an uneventful weekend.  By the morning of the 15th he could contract fully had no suicidal thoughts plans or intent and has benefited from the medication he had been on vortioxetine briefly but this did not seem to be the source of his orthostasis or the sensation of orthostasis so we reinstituted it.  No thoughts of harming self or others contracting fully  Physical Findings: AIMS: Facial and Oral Movements Muscles of Facial Expression: None, normal Lips  and Perioral Area: None, normal Jaw: None, normal Tongue: None, normal,Extremity Movements Upper (arms, wrists, hands, fingers): None, normal Lower (legs, knees, ankles, toes): None, normal, Trunk Movements Neck, shoulders, hips: None, normal, Overall Severity Severity of abnormal movements (highest score from questions above): None, normal Incapacitation due to abnormal movements: None, normal Patient's awareness of abnormal movements (rate only patient's report): No Awareness, Dental Status Current problems with teeth and/or dentures?: No Does patient usually wear dentures?: No  CIWA:    COWS:  COWS Total Score: 1  Musculoskeletal: Strength & Muscle Tone: within normal limits Gait & Station: normal Patient leans: N/A  Psychiatric Specialty Exam: Physical Exam  Review of Systems  Blood pressure (!) 132/105, pulse (!) 103, temperature 97.8 F (36.6 C), temperature source Oral, resp. rate 18, height 5\' 8"  (1.727 m), weight 83.9 kg, SpO2 99 %.Body mass index is 28.13 kg/m.  General Appearance: Casual  Eye Contact:  Good  Speech:  Clear and Coherent  Volume:  Normal  Mood:  Euthymic  Affect:  Appropriate  Thought Process:  Coherent and Goal Directed  Orientation:  Full (Time, Place, and Person)  Thought Content:  Logical  Suicidal Thoughts:  No  Homicidal Thoughts:  No  Memory:  Immediate;   Fair Recent;   Fair Remote;   Fair  Judgement:  Fair  Insight:  Good  Psychomotor Activity:  Normal  Concentration:   Concentration: Fair and Attention Span: Fair  Recall:  [employer] of Knowledge:  Fair  Language:  Fair  Akathisia:  Negative  Handed:  Right  AIMS (if indicated):     Assets:  Leisure Time Physical Health  ADL's:  Intact  Cognition:  WNL  Sleep:  Number of Hours: 4.75        Has this patient used any form of tobacco in the last 30 days? (Cigarettes, Smokeless Tobacco, Cigars, and/or Pipes) Yes, No  Blood Alcohol level:  Lab Results  Component Value Date   ETH <10 09/08/2019   ETH <10 0000000    Metabolic Disorder Labs:  Lab Results  Component Value Date   HGBA1C 6.2 (H) 09/07/2019   MPG 131.24 09/07/2019   MPG 142.72 10/25/2018   No results found for: PROLACTIN Lab Results  Component Value Date   CHOL 183 09/08/2019   TRIG 105 09/08/2019   HDL 52 09/08/2019   CHOLHDL 3.5 09/08/2019   VLDL 21 09/08/2019   LDLCALC 110 (H) 09/08/2019   [place] 62 07/05/2019    See Psychiatric Specialty Exam and Suicide Risk Assessment completed by Attending Physician prior to discharge.  Discharge destination:  Home  Is patient on multiple antipsychotic therapies at discharge:  No   Has Patient had three or more failed trials of antipsychotic monotherapy by history:  No  Recommended Plan for Multiple Antipsychotic Therapies: NA   Allergies as of 09/11/2019      Reactions   Bee Venom Anaphylaxis, Hives   Shellfish Allergy Anaphylaxis, Hives   Statins Other (See Comments)   Myalgias. Tolerating livalo.   Testosterone Cypionate    Testerone Injection --Increased breast tissue       Medication List    TAKE these medications     Indication  amLODipine 10 MG tablet Commonly known as: NORVASC Take 1 tablet (10 mg total) by mouth daily.  Indication: High Blood Pressure Disorder   ARIPiprazole 2 MG tablet Commonly known as: ABILIFY Take 1 tablet (2 mg total) by mouth daily. Start taking on: September 12, 2019  Indication: Major Depressive Disorder   aspirin 81 MG  chewable tablet Chew 1 tablet (81 mg total) by mouth daily.  Indication: Pain   co-enzyme Q-10 30 MG capsule Take 30 mg by mouth daily.  Indication: Weight Loss   famotidine 20 MG tablet Commonly known as: PEPCID Take 1 tablet (20 mg total) by mouth 2 (two) times daily.  Indication: Gastroesophageal Reflux Disease   isosorbide mononitrate 60 MG 24 hr tablet Commonly known as: IMDUR Take 1.5 tablets (90 mg total) by mouth daily.  Indication: Stable Angina Pectoris   Livalo 4 MG Tabs Generic drug: Pitavastatin Calcium TAKE 1 TABLET BY MOUTH ONCE DAILY IN THE MORNING What changed: See the new instructions.  Indication: High Amount of Fats in the Blood   metFORMIN 500 MG tablet Commonly known as: GLUCOPHAGE TAKE 1 TABLET BY MOUTH TWICE DAILY WITH A MEAL What changed: See the new instructions.  Indication: Antipsychotic Therapy-Induced Weight Gain, Type 2 Diabetes   metoprolol tartrate 100 MG tablet Commonly known as: LOPRESSOR Take 1 tablet by mouth twice daily  Indication: High Blood Pressure Disorder   mirtazapine 15 MG tablet Commonly known as: REMERON Take 1 tablet (15 mg total) by mouth at bedtime.  Indication: Major Depressive Disorder   ticagrelor 90 MG Tabs tablet Commonly known as: BRILINTA Take 1 tablet (90 mg total) by mouth 2 (two) times daily.  Indication: CORONARY ARTERY DISEASE   vortioxetine HBr 10 MG Tabs tablet Commonly known as: Trintellix Take 1 tablet (10 mg total) by mouth daily.  Indication: Major Depressive Disorder      Follow-up Information    [facility] Follow up on 09/26/2019.   Why: You are scheduled for an appointment on 09/26/19 at 8:00 am with [facility].  This will be a virtual tele-health appointment. Please have your insurance information and your discharge summary available. Contact information: [address] [place] [address] [phone]          SignedJohnn Hai, MD 09/11/2019, 11:15 AM

## 2019-09-11 NOTE — Progress Notes (Signed)
  [facility] Adult Case Management Discharge Plan :  Will you be returning to the same living situation after discharge:  Yes,  patient is returning home, alone At discharge, do you have transportation home?: Yes,  patient requested transport home, CSW arranged [facility]  Do you have the ability to pay for your medications: Yes,  Medicaid  Release of information consent forms completed and in the chart;  Patient's signature needed at discharge.  Patient to Follow up at: Follow-up Information    [facility] Follow up on 09/26/2019.   Why: You are scheduled for an appointment on 09/26/19 at 8:00 am with [facility].  This will be a virtual tele-health appointment. Please have your insurance information and your discharge summary available. Contact information: [address] [place] [address] [phone]           Next level of care provider has access to [place] and Suicide Prevention discussed: Yes,  with the patient     Has patient been referred to the Quitline?: Patient refused referral  Patient has been referred for addiction treatment: Pt. refused referral  [name], [place] 09/11/2019, 9:29 AM

## 2019-09-11 NOTE — Progress Notes (Signed)
Recreation Therapy Notes  Date:  3.15.21 Time: 0930 Location: [address] Group Room  Group Topic: Stress Management  Goal Area(s) Addresses:  Patient will identify positive stress management techniques. Patient will identify benefits of using stress management post d/c.  Intervention: Stress Management  Activity : Meditation.  LRT played a meditation that focused on choice.  Patients were to listen and follow along as meditation played to engage in the meditation.  Education:  Stress Management, Discharge Planning.   Education Outcome: Acknowledges Education  Clinical Observations/Feedback:  Pt did not attend group activity.    [name], LRT/CTRS        [name] A 09/11/2019 12:01 PM

## 2019-09-11 NOTE — Progress Notes (Signed)
   09/11/19 0042  COVID-19 Daily Checkoff  Have you had a fever (temp > 37.80C/100F)  in the past 24 hours?  No  If you have had runny nose, nasal congestion, sneezing in the past 24 hours, has it worsened? No  COVID-19 EXPOSURE  Have you traveled outside the state in the past 14 days? No  Have you been in contact with someone with a confirmed diagnosis of COVID-19 or PUI in the past 14 days without wearing appropriate PPE? No  Have you been living in the same home as a person with confirmed diagnosis of COVID-19 or a PUI (household contact)? No  Have you been diagnosed with COVID-19? No

## 2019-09-11 NOTE — [facility]HH Suicide Risk Assessment (Signed)
[facility] Discharge Suicide Risk Assessment   Principal Problem: <principal problem not specified> Discharge Diagnoses: Active Problems:   MDD (major depressive disorder), single episode, severe with psychotic features ([place])   MDD (major depressive disorder)   Total Time spent with patient: 45 minutes  Musculoskeletal: Strength & Muscle Tone: within normal limits Gait & Station: normal Patient leans: N/A  Psychiatric Specialty Exam: Physical Exam  Review of Systems  Blood pressure (!) 132/105, pulse (!) 103, temperature 97.8 F (36.6 C), temperature source Oral, resp. rate 18, height 5\' 8"  (1.727 m), weight 83.9 kg, SpO2 99 %.Body mass index is 28.13 kg/m.  General Appearance: Casual  Eye Contact:  Good  Speech:  Clear and Coherent  Volume:  Normal  Mood:  Euthymic  Affect:  Appropriate  Thought Process:  Coherent and Goal Directed  Orientation:  Full (Time, Place, and Person)  Thought Content:  Logical  Suicidal Thoughts:  No  Homicidal Thoughts:  No  Memory:  Immediate;   Fair Recent;   Fair Remote;   Fair  Judgement:  Fair  Insight:  Good  Psychomotor Activity:  Normal  Concentration:  Concentration: Fair and Attention Span: Fair  Recall:  [employer] of Knowledge:  Fair  Language:  Fair  Akathisia:  Negative  Handed:  Right  AIMS (if indicated):     Assets:  Leisure Time Physical Health  ADL's:  Intact  Cognition:  WNL  Sleep:  Number of Hours: 4.75      Mental Status Per Nursing Assessment::   On Admission:  Suicidal ideation indicated by patient, Self-harm thoughts  Demographic Factors:  Male  Loss Factors: Decrease in vocational status  Historical Factors: Prior suicide attempts  Risk Reduction Factors:   Sense of responsibility to family and Religious beliefs about death  Continued Clinical Symptoms:  Previous Psychiatric Diagnoses and Treatments  Cognitive Features That Contribute To Risk:  None    Suicide Risk:  Minimal: No identifiable  suicidal ideation.  Patients presenting with no risk factors but with morbid ruminations; may be classified as minimal risk based on the severity of the depressive symptoms  Follow-up Information    [facility] Follow up on 09/26/2019.   Why: You are scheduled for an appointment on 09/26/19 at 8:00 am with [facility].  This will be a virtual tele-health appointment. Please have your insurance information and your discharge summary available. Contact information: [address] [place] [address] [phone]           Plan Of Care/Follow-up recommendations:  Activity:  full  Saige Canton, MD 09/11/2019, 11:18 AM

## 2019-09-11 NOTE — Progress Notes (Signed)
ADULT GRIEF GROUP NOTE:  Spiritual care group on grief and loss facilitated by chaplain Jerene Pitch  Group Goal:  Support / Education around grief and loss Members engage in facilitated group support and psycho-social education.  Group Description:  Following introductions and group rules, group members engaged in facilitated group dialog and support around topic of loss, with particular support around experiences of loss in their lives. Group Identified types of loss (relationships / self / things) and identified patterns, circumstances, and changes that precipitate losses. Reflected on thoughts / feelings around loss, normalized grief responses, and recognized variety in grief experience. Patient Progress:  Present throughout group.  Alert and observant.  Did not engage in group discussion.

## 2019-09-11 NOTE — Progress Notes (Signed)
   09/11/19 0045  Psych Admission Type (Psych Patients Only)  Admission Status Voluntary  Psychosocial Assessment  Patient Complaints None  Eye Contact Brief  Facial Expression Flat  Affect Appropriate to circumstance  Speech Logical/coherent  Interaction Minimal  Motor Activity Other (Comment) (WDL)  Appearance/Hygiene Unremarkable  Behavior Characteristics Appropriate to situation  Mood Pleasant  Thought Process  Coherency WDL  Content WDL  Delusions None reported or observed  Perception WDL  Hallucination None reported or observed  Judgment Impaired  Confusion None  Danger to Self  Current suicidal ideation? Denies  Self-Injurious Behavior No self-injurious ideation or behavior indicators observed or expressed   Agreement Not to Harm Self Yes  Description of Agreement verbally contracts for safety  Danger to Others  Danger to Others None reported or observed

## 2019-09-11 NOTE — Progress Notes (Signed)
D:  Patient denied SI and HI, contracts for safety.  Denied A/V hallucinations.   A:  Medications administered per MD orders.  Emotional support and encouragement given patient. R:  Safety maintained with 15 minute checks.  

## 2019-09-28 ENCOUNTER — Other Ambulatory Visit: Payer: Self-pay

## 2019-09-28 ENCOUNTER — Encounter (HOSPITAL_COMMUNITY): Payer: Self-pay | Admitting: Emergency Medicine

## 2019-09-28 ENCOUNTER — Inpatient Hospital Stay (HOSPITAL_COMMUNITY)
Admission: EM | Admit: 2019-09-28 | Discharge: 2019-09-30 | DRG: 251 | Disposition: A | Discharge status: Home / Self Care | Payer: Medicaid Other | Attending: Cardiovascular Disease | Admitting: Cardiovascular Disease

## 2019-09-28 ENCOUNTER — Emergency Department (HOSPITAL_COMMUNITY): Payer: Medicaid Other

## 2019-09-28 DIAGNOSIS — I252 Old myocardial infarction: Secondary | ICD-10-CM

## 2019-09-28 DIAGNOSIS — Z7902 Long term (current) use of antithrombotics/antiplatelets: Secondary | ICD-10-CM

## 2019-09-28 DIAGNOSIS — Z21 Asymptomatic human immunodeficiency virus [HIV] infection status: Secondary | ICD-10-CM

## 2019-09-28 DIAGNOSIS — Z8042 Family history of malignant neoplasm of prostate: Secondary | ICD-10-CM

## 2019-09-28 DIAGNOSIS — E785 Hyperlipidemia, unspecified: Secondary | ICD-10-CM | POA: Diagnosis present

## 2019-09-28 DIAGNOSIS — E119 Type 2 diabetes mellitus without complications: Secondary | ICD-10-CM | POA: Diagnosis present

## 2019-09-28 DIAGNOSIS — Z7984 Long term (current) use of oral hypoglycemic drugs: Secondary | ICD-10-CM

## 2019-09-28 DIAGNOSIS — I2510 Atherosclerotic heart disease of native coronary artery without angina pectoris: Secondary | ICD-10-CM

## 2019-09-28 DIAGNOSIS — I25110 Atherosclerotic heart disease of native coronary artery with unstable angina pectoris: Secondary | ICD-10-CM | POA: Diagnosis present

## 2019-09-28 DIAGNOSIS — Z20822 Contact with and (suspected) exposure to covid-19: Secondary | ICD-10-CM | POA: Diagnosis present

## 2019-09-28 DIAGNOSIS — I10 Essential (primary) hypertension: Secondary | ICD-10-CM | POA: Diagnosis present

## 2019-09-28 DIAGNOSIS — T82855A Stenosis of coronary artery stent, initial encounter: Principal | ICD-10-CM | POA: Diagnosis present

## 2019-09-28 DIAGNOSIS — Z79899 Other long term (current) drug therapy: Secondary | ICD-10-CM | POA: Diagnosis not present

## 2019-09-28 DIAGNOSIS — Z91041 Radiographic dye allergy status: Secondary | ICD-10-CM | POA: Diagnosis not present

## 2019-09-28 DIAGNOSIS — I200 Unstable angina: Secondary | ICD-10-CM | POA: Diagnosis present

## 2019-09-28 DIAGNOSIS — Z7982 Long term (current) use of aspirin: Secondary | ICD-10-CM | POA: Diagnosis not present

## 2019-09-28 DIAGNOSIS — E782 Mixed hyperlipidemia: Secondary | ICD-10-CM

## 2019-09-28 DIAGNOSIS — E7800 Pure hypercholesterolemia, unspecified: Secondary | ICD-10-CM | POA: Diagnosis not present

## 2019-09-28 DIAGNOSIS — F339 Major depressive disorder, recurrent, unspecified: Secondary | ICD-10-CM | POA: Diagnosis present

## 2019-09-28 DIAGNOSIS — R079 Chest pain, unspecified: Secondary | ICD-10-CM | POA: Diagnosis not present

## 2019-09-28 DIAGNOSIS — F322 Major depressive disorder, single episode, severe without psychotic features: Secondary | ICD-10-CM | POA: Diagnosis present

## 2019-09-28 DIAGNOSIS — Z806 Family history of leukemia: Secondary | ICD-10-CM | POA: Diagnosis not present

## 2019-09-28 DIAGNOSIS — Z9861 Coronary angioplasty status: Secondary | ICD-10-CM | POA: Diagnosis not present

## 2019-09-28 DIAGNOSIS — Z8249 Family history of ischemic heart disease and other diseases of the circulatory system: Secondary | ICD-10-CM

## 2019-09-28 DIAGNOSIS — Y831 Surgical operation with implant of artificial internal device as the cause of abnormal reaction of the patient, or of later complication, without mention of misadventure at the time of the procedure: Secondary | ICD-10-CM | POA: Diagnosis present

## 2019-09-28 LAB — GLUCOSE, CAPILLARY: Glucose-Capillary: 182 mg/dL — ABNORMAL HIGH (ref 70–99)

## 2019-09-28 LAB — CBC
HCT: 44.8 % (ref 39.0–52.0)
HCT: 41.6 % (ref 39.0–52.0)
Hemoglobin: 14.3 g/dL (ref 13.0–17.0)
Hemoglobin: 13.5 g/dL (ref 13.0–17.0)
MCH: 28.4 pg (ref 26.0–34.0)
MCH: 28.5 pg (ref 26.0–34.0)
MCHC: 31.9 g/dL (ref 30.0–36.0)
MCHC: 32.5 g/dL (ref 30.0–36.0)
MCV: 88.9 fL (ref 80.0–100.0)
MCV: 87.8 fL (ref 80.0–100.0)
Platelets: 252 10*3/uL (ref 150–400)
Platelets: 233 10*3/uL (ref 150–400)
RBC: 5.04 MIL/uL (ref 4.22–5.81)
RBC: 4.74 MIL/uL (ref 4.22–5.81)
RDW: 12.2 % (ref 11.5–15.5)
RDW: 12.3 % (ref 11.5–15.5)
WBC: 5.8 10*3/uL (ref 4.0–10.5)
WBC: 6.4 10*3/uL (ref 4.0–10.5)
nRBC: 0 % (ref 0.0–0.2)
nRBC: 0 % (ref 0.0–0.2)

## 2019-09-28 LAB — CREATININE, SERUM
Creatinine, Ser: 0.74 mg/dL (ref 0.61–1.24)
GFR calc Af Amer: >60 mL/min (ref >60)
GFR calc non Af Amer: >60 mL/min (ref >60)

## 2019-09-28 LAB — BASIC METABOLIC PANEL
Anion gap: 9 (ref 5–15)
BUN: 15 mg/dL (ref 6–20)
CO2: 23 mmol/L (ref 22–32)
Calcium: 9.3 mg/dL (ref 8.9–10.3)
Chloride: 108 mmol/L (ref 98–111)
Creatinine, Ser: 0.79 mg/dL (ref 0.61–1.24)
GFR calc Af Amer: >60 mL/min (ref >60)
GFR calc non Af Amer: >60 mL/min (ref >60)
Glucose, Bld: 161 mg/dL — ABNORMAL HIGH (ref 70–99)
Potassium: 4.4 mmol/L (ref 3.5–5.1)
Sodium: 140 mmol/L (ref 135–145)

## 2019-09-28 LAB — TROPONIN I (HIGH SENSITIVITY)
Troponin I (High Sensitivity): 8 ng/L (ref <18)
Troponin I (High Sensitivity): 8 ng/L (ref <18)

## 2019-09-28 LAB — BRAIN NATRIURETIC PEPTIDE: B Natriuretic Peptide: 31.4 pg/mL (ref 0.0–100.0)

## 2019-09-28 LAB — SARS CORONAVIRUS 2 (TAT 6-24 HRS): SARS Coronavirus 2: NEGATIVE

## 2019-09-28 MED ORDER — SODIUM CHLORIDE 0.9% FLUSH: 3.0000 mL | INTRAVENOUS | Status: DC | PRN

## 2019-09-28 MED ORDER — SODIUM CHLORIDE 0.9% FLUSH: 3.0000 mL | Freq: Once | INTRAVENOUS | Status: DC

## 2019-09-28 MED ORDER — PRAVASTATIN SODIUM 40 MG PO TABS
80.0000 mg | ORAL_TABLET | Freq: Every day | ORAL | Status: DC
  Administered 2019-09-28 – 2019-09-29 (×2): 80 mg via ORAL
  Filled 2019-09-28 (×2): qty 2

## 2019-09-28 MED ORDER — SODIUM CHLORIDE 0.9 % WEIGHT BASED INFUSION
1.0000 mL/kg/h | INTRAVENOUS | Status: DC
  Administered 2019-09-29: 1 mL/kg/h via INTRAVENOUS

## 2019-09-28 MED ORDER — ACETAMINOPHEN 325 MG PO TABS: 650.0000 mg | ORAL_TABLET | ORAL | Status: DC | PRN

## 2019-09-28 MED ORDER — INSULIN ASPART 100 UNIT/ML ~~LOC~~ SOLN
0.0000 [IU] | Freq: Three times a day (TID) | SUBCUTANEOUS | Status: DC
  Administered 2019-09-29: 3 [IU] via SUBCUTANEOUS
  Administered 2019-09-29: 2 [IU] via SUBCUTANEOUS
  Administered 2019-09-29 – 2019-09-30 (×2): 5 [IU] via SUBCUTANEOUS

## 2019-09-28 MED ORDER — ISOSORBIDE MONONITRATE ER 60 MG PO TB24
90.0000 mg | ORAL_TABLET | Freq: Every day | ORAL | Status: DC
  Administered 2019-09-28 – 2019-09-30 (×3): 90 mg via ORAL
  Filled 2019-09-28: qty 3
  Filled 2019-09-28 (×2): qty 1

## 2019-09-28 MED ORDER — TICAGRELOR 90 MG PO TABS
90.0000 mg | ORAL_TABLET | Freq: Two times a day (BID) | ORAL | Status: DC
  Administered 2019-09-28 – 2019-09-30 (×4): 90 mg via ORAL
  Filled 2019-09-28 (×5): qty 1

## 2019-09-28 MED ORDER — INSULIN ASPART 100 UNIT/ML ~~LOC~~ SOLN: 0.0000 [IU] | Freq: Every day | SUBCUTANEOUS | Status: DC

## 2019-09-28 MED ORDER — NITROGLYCERIN 0.4 MG SL SUBL: 0.4000 mg | SUBLINGUAL_TABLET | SUBLINGUAL | Status: DC | PRN

## 2019-09-28 MED ORDER — SODIUM CHLORIDE 0.9 % IV SOLN: 250.0000 mL | INTRAVENOUS | Status: DC | PRN

## 2019-09-28 MED ORDER — AMLODIPINE BESYLATE 10 MG PO TABS
10.0000 mg | ORAL_TABLET | Freq: Every day | ORAL | Status: DC
  Administered 2019-09-28 – 2019-09-30 (×3): 10 mg via ORAL
  Filled 2019-09-28: qty 1
  Filled 2019-09-28: qty 2
  Filled 2019-09-28: qty 1

## 2019-09-28 MED ORDER — MIRTAZAPINE 15 MG PO TABS
15.0000 mg | ORAL_TABLET | Freq: Every day | ORAL | Status: DC
  Administered 2019-09-28 – 2019-09-29 (×2): 15 mg via ORAL
  Filled 2019-09-28 (×3): qty 1

## 2019-09-28 MED ORDER — SODIUM CHLORIDE 0.9% FLUSH
3.0000 mL | Freq: Two times a day (BID) | INTRAVENOUS | Status: DC
  Administered 2019-09-28 – 2019-09-29 (×2): 3 mL via INTRAVENOUS

## 2019-09-28 MED ORDER — SODIUM CHLORIDE 0.9 % WEIGHT BASED INFUSION
3.0000 mL/kg/h | INTRAVENOUS | Status: DC
  Administered 2019-09-29: 04:00:00 3 mL/kg/h via INTRAVENOUS

## 2019-09-28 MED ORDER — HEPARIN SODIUM (PORCINE) 5000 UNIT/ML IJ SOLN
5000.0000 [IU] | Freq: Three times a day (TID) | INTRAMUSCULAR | Status: DC
  Administered 2019-09-28 – 2019-09-29 (×2): 5000 [IU] via SUBCUTANEOUS
  Filled 2019-09-28 (×2): qty 1

## 2019-09-28 MED ORDER — COENZYME Q10 30 MG PO CAPS: 30.0000 mg | ORAL_CAPSULE | Freq: Every day | ORAL | Status: DC

## 2019-09-28 MED ORDER — ARIPIPRAZOLE 2 MG PO TABS
2.0000 mg | ORAL_TABLET | Freq: Every day | ORAL | Status: DC
  Administered 2019-09-28 – 2019-09-30 (×3): 2 mg via ORAL
  Filled 2019-09-28 (×4): qty 1

## 2019-09-28 MED ORDER — ASPIRIN 81 MG PO CHEW
81.0000 mg | CHEWABLE_TABLET | Freq: Every day | ORAL | Status: DC
  Administered 2019-09-28 – 2019-09-30 (×3): 81 mg via ORAL
  Filled 2019-09-28 (×4): qty 1

## 2019-09-28 MED ORDER — METOPROLOL TARTRATE 100 MG PO TABS
100.0000 mg | ORAL_TABLET | Freq: Two times a day (BID) | ORAL | Status: DC
  Administered 2019-09-28 – 2019-09-29 (×2): 100 mg via ORAL
  Filled 2019-09-28 (×2): qty 1

## 2019-09-28 MED ORDER — ONDANSETRON HCL 4 MG/2ML IJ SOLN: 4.0000 mg | Freq: Four times a day (QID) | INTRAMUSCULAR | Status: DC | PRN

## 2019-09-28 MED ORDER — FAMOTIDINE 20 MG PO TABS
20.0000 mg | ORAL_TABLET | Freq: Two times a day (BID) | ORAL | Status: DC
  Administered 2019-09-28 – 2019-09-30 (×4): 20 mg via ORAL
  Filled 2019-09-28 (×4): qty 1

## 2019-09-28 NOTE — Discharge Instructions (Addendum)
Radial Site Care  This sheet gives you information about how to care for yourself after your procedure. Your health care provider may also give you more specific instructions. If you have problems or questions, contact your health care provider. What can I expect after the procedure? After the procedure, it is common to have:  Bruising and tenderness at the catheter insertion area. Follow these instructions at home: Medicines  Take over-the-counter and prescription medicines only as told by your health care provider. Insertion site care  Follow instructions from your health care provider about how to take care of your insertion site. Make sure you: ? Wash your hands with soap and water before you change your bandage (dressing). If soap and water are not available, use hand sanitizer. ? Change your dressing as told by your health care provider. ? Leave stitches (sutures), skin glue, or adhesive strips in place. These skin closures may need to stay in place for 2 weeks or longer. If adhesive strip edges start to loosen and curl up, you may trim the loose edges. [name] not remove adhesive strips completely unless your health care provider tells you to [name] that.  Check your insertion site every day for signs of infection. Check for: ? Redness, swelling, or pain. ? Fluid or blood. ? Pus or a bad smell. ? Warmth.  [name] not take baths, swim, or use a hot tub until your health care provider approves.  You may shower 24-48 hours after the procedure, or as directed by your health care provider. ? Remove the dressing and gently wash the site with plain soap and water. ? Pat the area dry with a clean towel. ? [name] not rub the site. That could cause bleeding.  [name] not apply powder or lotion to the site. Activity   For 24 hours after the procedure, or as directed by your health care provider: ? [name] not flex or bend the affected arm. ? [name] not push or pull heavy objects with the affected arm. ? [name] not  drive yourself home from the hospital or clinic. You may drive 24 hours after the procedure unless your health care provider tells you not to. ? [name] not operate machinery or power tools.  [name] not lift anything that is heavier than 10 lb (4.5 kg), or the limit that you are told, until your health care provider says that it is safe.  Ask your health care provider when it is okay to: ? Return to work or school. ? Resume usual physical activities or sports. ? Resume sexual activity. General instructions  If the catheter site starts to bleed, raise your arm and put firm pressure on the site. If the bleeding does not stop, get help right away. This is a medical emergency.  If you went home on the same day as your procedure, a responsible adult should be with you for the first 24 hours after you arrive home.  Keep all follow-up visits as told by your health care provider. This is important. Contact a health care provider if:  You have a fever.  You have redness, swelling, or yellow drainage around your insertion site. Get help right away if:  You have unusual pain at the radial site.  The catheter insertion area swells very fast.  The insertion area is bleeding, and the bleeding does not stop when you hold steady pressure on the area.  Your arm or hand becomes pale, cool, tingly, or numb. These symptoms may represent a serious problem   that is an emergency. [name] not wait to see if the symptoms will go away. Get medical help right away. Call your local emergency services (911 in the [place] [name] not drive yourself to the hospital. Summary  After the procedure, it is common to have bruising and tenderness at the site.  Follow instructions from your health care provider about how to take care of your radial site wound. Check the wound every day for signs of infection.  [name] not lift anything that is heavier than 10 lb (4.5 kg), or the limit that you are told, until your health care provider says  that it is safe. This information is not intended to replace advice given to you by your health care provider. Make sure you discuss any questions you have with your health care provider. Document Revised: 07/21/2017 Document Reviewed: 07/21/2017 Elsevier Patient Education  2020 Elsevier Inc.  

## 2019-09-28 NOTE — ED Triage Notes (Addendum)
Pt arrives to the ER with cp that has been ongoing for 1 month- pt states it never really seems to go away. Pt states physical activity or exertion makes this worse. Pt reports he was riding a bike up until 1 month ago now when he begins to ride he gets a "burning in his chest".

## 2019-09-28 NOTE — ED Provider Notes (Signed)
Assumed care from off going provider at 1500.  For details on ED course prior to handoff, please refer to previous team's note.  In brief, patient has a history of CAD and presented to the ED with exertional chest pain for the past month.  He had a recent medication change of Imdur from 60 mg to 90 mg daily.  His troponins were stable and his EKG was similar to previous.  At handoff, cardiology team has been consulted.  Awaiting their disposition regarding admit for observation versus discharge with close outpatient follow-up.  Physical Exam  BP 126/82   Pulse 60   Temp 97.6 F (36.4 C) (Oral)   Resp 14   Ht 5\' 8"  (1.727 m)   Wt 79.4 kg   SpO2 99%   BMI 26.61 kg/m   Physical Exam Vitals and nursing note reviewed.  Constitutional:      Appearance: Normal appearance. He is well-developed.  HENT:     Head: Normocephalic and atraumatic.     Nose: Nose normal.     Mouth/Throat:     Mouth: Mucous membranes are moist.     Pharynx: Oropharynx is clear.  Eyes:     Extraocular Movements: Extraocular movements intact.     Pupils: Pupils are equal, round, and reactive to light.  Cardiovascular:     Rate and Rhythm: Normal rate and regular rhythm.     Pulses: Normal pulses.     Heart sounds: Normal heart sounds.  Pulmonary:     Effort: Pulmonary effort is normal. No respiratory distress.     Breath sounds: Normal breath sounds.  Abdominal:     General: There is no distension.     Palpations: Abdomen is soft.     Tenderness: There is no abdominal tenderness.  Musculoskeletal:        General: Normal range of motion.     Cervical back: Normal range of motion and neck supple.  Skin:    General: Skin is warm and dry.     Capillary Refill: Capillary refill takes less than 2 seconds.  Neurological:     General: No focal deficit present.     Mental Status: He is alert.     ED Course/Procedures     Procedures  MDM   Cardiology team has evaluated patient and is electing to admit  patient for observation.  No further intervention provided while under my care.  Patient stable at time of admission.  Pt assessed and evaluated with Dr. Sherry Ruffing.   Nadeen Landau, MD      Nadeen Landau, MD 09/28/19 [place]    Tegeler, Gwenyth Allegra, MD 09/29/19 1023

## 2019-09-28 NOTE — ED Provider Notes (Signed)
[facility] EMERGENCY DEPARTMENT Provider Note   CSN: [id number] Arrival date & time: 09/28/19  0813     History Chief Complaint  Patient presents with  . Chest Pain    [name] is a 60 y.o. male.  59 y.o male with a PMH of CAD, HTN, MI, Unstable angina, DM presents to the ED with a chief complaint of chest pain with exertion x 1 month. Patient was seen by his cardiologist Dr. Debara Pickett who adjusted his medications.  Patient reports he continues to have chest pain with exertion, this does not allow him to ride his bike or exercise like he is used to.  He also endorses eating "I been eating a lot of junk food I think this is likely worsening it ".  He reports the chest pain subsides while at rest.  He describes this as burning sensation to the substernal region without radiation.  Has not taken any medication for improvement in his symptoms.  Alleviated with rest.  Also reports shortness of breath, states this occurs when he is exerting as well.  He denies any nausea, vomiting, headaches or weakness.   The history is provided by the patient.  Chest Pain Associated symptoms: shortness of breath   Associated symptoms: no abdominal pain, no back pain, no fever, no headache, no nausea, no palpitations, no vomiting and no weakness        Past Medical History:  Diagnosis Date  . Allergies   . Arthritis   . Chest pain   . Chronic lower back pain   . Chronic pain of right wrist   . Coronary artery disease    a. Multiple prior caths/PCI. Cath 2013 with possible spasm of RCA, 70% ISR of mid LCx with subsequent DES to mLCx and prox LCX. b. H/o microvascular angina. c. Recurrent angina 08/2014 - s/p PTCA/DES to prox Cx, PTCA/CBA to OM1.  c. LHC 06/10/15 with patent stents and some ISR in LCX and OM-1 that was not flow limiting --> Rx   . Dyslipidemia    a. Intolerant to many statins except tolerating Livalo.  Marland Kitchen GERD (gastroesophageal reflux disease)   . H/O cardiac  catheterization 10/25/2018  . Hypertension   . Myocardial infarction ([place]) ~ 2010  . S/P angioplasty with stent, DES, to proximal and mid LCX 12/15/11 12/15/2011  . Shoulder pain   . Stroke [facility])    pt. reports had a stroke around time of MI 2010  . Type II diabetes mellitus ([place])   . Unstable angina [facility])     Patient Active Problem List   Diagnosis Date Noted  . Tinea cruris 07/06/2019  . Hemoglobin A1c less than 7.0% 07/06/2019  . History of cardiac catheterization 04/05/2019  . Muscle strain of wrist 04/05/2019  . Pain in both wrists 04/05/2019  . Anxiety 04/05/2019  . Prediabetes 04/05/2019  . MDD (major depressive disorder) 01/12/2019  . Chest pain 10/25/2018  . 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 Influenza A 07/17/2017  . Lactic acidosis   . Cough   . Nausea and vomiting   . Severe episode of recurrent major depressive disorder, without psychotic features ([place])   . MDD (major depressive disorder), single episode, severe with psychotic features ([place]) 06/27/2017  . MDD (major depressive disorder), severe ([place]) 06/23/2017  . Essential hypertension 01/13/2016  . Coronary artery disease involving native coronary artery of native heart without angina pectoris 01/13/2016  . Unstable angina ([place])   .  ED (erectile dysfunction) 02/15/2014  . Allergy to contrast media (used for diagnostic x-rays) 05/12/2011  . Hypertension 05/09/2011  . Hyperlipidemia 05/09/2011  . DM2 (diabetes mellitus, type 2) ([place]) 07/10/2010  . CAD S/P percutaneous coronary angioplasty 07/10/2010  . DYSPEPSIA&OTHER [facility] DISORDERS FUNCTION STOMACH 07/10/2010    Past Surgical History:  Procedure Laterality Date  . CARDIAC CATHETERIZATION  06/15/2002   LAD with prox 40% stenosis, norma L main, Cfx with 25% lesion, RCA with long mid 25% stenosis (Dr. Vita Barley)  . CARDIAC CATHETERIZATION  04/01/2010   normal L main, LAD wit mild stenosis, L Cfx with 70%  in-stent restenosis, RCA with 70% in-stent restenosis, LVEF >60% (Dr. K. Mali Hilty) - cutting ballon arthrectomy to RCA & Cfx (Dr. Rockne Menghini)  . CARDIAC CATHETERIZATION  08/25/2010   preserved global LV contractility; multivessel CAD, diffuse 90-95% in-stent restenosis in prox placed Cfx stent - cutting balloon arthrectomy in Cfx with multiple dilatations 90-95% to 0% stenosis (Dr. Corky Downs)  . CARDIAC CATHETERIZATION  01/26/2011   PCI & stenting of aggresive in-stent restenosis within previously stented AV groove Cfx with 3.0x4mm Taxus DES (previous stents were Promus) (Dr. Adora Fridge)  . CARDIAC CATHETERIZATION  05/11/2011   preserved LV function, 40% mid LAD stenosis, 30-40% narrowing proximal to stented semgnet of prox Cfx, patent mid RCA stent with smooth 20% narrowing in distal RCA (Dr. Corky Downs)  . CARDIAC CATHETERIZATION  12/15/2011   PCI & stenting of proximal & mid Cfx with DES - 3.0x66mm in proximal, 3.0x45mm in mid (Dr. Adora Fridge)  . CARDIAC CATHETERIZATION N/A 06/10/2015   Procedure: Left Heart Cath and Coronary Angiography;  Surgeon: Jolaine Artist, MD;  Location: [facility] CV LAB;  Service: Cardiovascular;  Laterality: N/A;  . CARDIAC CATHETERIZATION  10/25/2018  . cardiometabolic testing  123456   good exercise effort, peak VO2 79% predicted with normal VO2 HR curves (mild deconditioning)  . COLONOSCOPY  12/2012   diminutive hyperplastic sigmoid poyp so repeat routine 2024  . CORONARY BALLOON ANGIOPLASTY N/A 10/25/2018   Procedure: CORONARY BALLOON ANGIOPLASTY;  Surgeon: Jettie Booze, MD;  Location: [facility] CV LAB;  Service: Cardiovascular;  Laterality: N/A;  . EXCISIONAL HEMORRHOIDECTOMY  1984  . LEFT HEART CATH AND CORONARY ANGIOGRAPHY N/A 10/25/2018   Procedure: LEFT HEART CATH AND CORONARY ANGIOGRAPHY;  Surgeon: Jettie Booze, MD;  Location: [facility] CV LAB;  Service: Cardiovascular;  Laterality: N/A;  . LEFT HEART CATHETERIZATION WITH CORONARY  ANGIOGRAM N/A 05/11/2011   Procedure: LEFT HEART CATHETERIZATION WITH CORONARY ANGIOGRAM;  Surgeon: Troy Sine, MD;  Location: [facility] CATH LAB;  Service: Cardiovascular;  Laterality: N/A;  Possible percutaneous coronary intervention, possible IVUS  . LEFT HEART CATHETERIZATION WITH CORONARY ANGIOGRAM N/A 12/15/2011   Procedure: LEFT HEART CATHETERIZATION WITH CORONARY ANGIOGRAM;  Surgeon: Lorretta Harp, MD;  Location: [facility] CATH LAB;  Service: Cardiovascular;  Laterality: N/A;  . LEFT HEART CATHETERIZATION WITH CORONARY ANGIOGRAM N/A 09/05/2014   Procedure: LEFT HEART CATHETERIZATION WITH CORONARY ANGIOGRAM;  Surgeon: Burnell Blanks, MD;  Location: [facility] CATH LAB;  Service: Cardiovascular;  Laterality: N/A;  . LIPOMA EXCISION     back of the head  . NM MYOCAR PERF WALL MOTION  02/2012   lexiscan myoview; mild perfusion defect in mid inferolateral & basal inferolateral region (infarct/scar); EF 52%, abnormal but ow risk scan  . PERCUTANEOUS CORONARY STENT INTERVENTION (PCI-S)  09/05/2014   Procedure: PERCUTANEOUS CORONARY STENT INTERVENTION (PCI-S);  Surgeon: Burnell Blanks, MD;  Location: [facility] CATH LAB;  Service: Cardiovascular;;       Family History  Problem Relation Age of Onset  . Leukemia Mother   . Prostate cancer Father   . Coronary artery disease Paternal Grandmother   . Cancer Paternal Grandfather   . Cancer Brother     Social History   Tobacco Use  . Smoking status: Former Smoker    Packs/day: 1.00    Years: 10.00    Pack years: 10.00    Types: Cigarettes    Quit date: 10/07/2018    Years since quitting: 0.9  . Smokeless tobacco: Never Used  Substance Use Topics  . Alcohol use: Yes  . Drug use: No    Comment: 12/15/11 "last cocaine was 2010"    Home Medications Prior to Admission medications   Medication Sig Start Date End Date Taking? Authorizing Provider  amLODipine (NORVASC) 10 MG tablet Take 1 tablet (10 mg total) by mouth daily. 04/04/19  Yes Azzie Glatter, FNP  ARIPiprazole (ABILIFY) 2 MG tablet Take 1 tablet (2 mg total) by mouth daily. 09/12/19  Yes Johnn Hai, MD  aspirin 81 MG chewable tablet Chew 1 tablet (81 mg total) by mouth daily. 10/27/18  Yes Reino Bellis B, NP  co-enzyme Q-10 30 MG capsule Take 30 mg by mouth daily.   Yes [provider]  famotidine (PEPCID) 20 MG tablet Take 1 tablet (20 mg total) by mouth 2 (two) times daily. 09/11/19  Yes Johnn Hai, MD  isosorbide mononitrate (IMDUR) 60 MG 24 hr tablet Take 1.5 tablets (90 mg total) by mouth daily. 08/01/19  Yes Hilty, Nadean Corwin, MD  LIVALO 4 MG TABS TAKE 1 TABLET BY MOUTH ONCE DAILY IN THE MORNING Patient taking differently: Take 1 tablet by mouth every morning.  08/25/19  Yes Azzie Glatter, FNP  metFORMIN (GLUCOPHAGE) 500 MG tablet TAKE 1 TABLET BY MOUTH TWICE DAILY WITH A MEAL Patient taking differently: Take 500 mg by mouth 2 (two) times daily with a meal.  08/25/19  Yes Azzie Glatter, FNP  metoprolol tartrate (LOPRESSOR) 100 MG tablet Take 1 tablet by mouth twice daily Patient taking differently: Take 100 mg by mouth 2 (two) times daily.  07/31/19  Yes Azzie Glatter, FNP  mirtazapine (REMERON) 15 MG tablet Take 1 tablet (15 mg total) by mouth at bedtime. 09/11/19  Yes Johnn Hai, MD  ticagrelor (BRILINTA) 90 MG TABS tablet Take 1 tablet (90 mg total) by mouth 2 (two) times daily. 04/04/19  Yes Azzie Glatter, FNP  vortioxetine HBr (TRINTELLIX) 10 MG TABS tablet Take 1 tablet (10 mg total) by mouth daily. Patient not taking: Reported on 09/28/2019 09/11/19   Johnn Hai, MD    Allergies    Bee venom, Shellfish allergy, Statins, and Testosterone cypionate  Review of Systems   Review of Systems  Constitutional: Negative for fever.  HENT: Negative for sore throat.   Respiratory: Positive for shortness of breath.   Cardiovascular: Positive for chest pain. Negative for palpitations and leg swelling.  Gastrointestinal: Negative for abdominal  pain, nausea and vomiting.  Genitourinary: Negative for flank pain.  Musculoskeletal: Negative for back pain.  Neurological: Negative for syncope, weakness, light-headedness and headaches.  All other systems reviewed and are negative.   Physical Exam Updated Vital Signs BP 126/82   Pulse 60   Temp 97.6 F (36.4 C) (Oral)   Resp 14   Ht 5\' 8"  (1.727 m)   Wt 79.4 kg   SpO2  99%   BMI 26.61 kg/m   Physical Exam Vitals and nursing note reviewed.  Constitutional:      Appearance: He is well-developed. He is not ill-appearing or toxic-appearing.  HENT:     Head: Normocephalic and atraumatic.  Eyes:     General: No scleral icterus.    Pupils: Pupils are equal, round, and reactive to light.  Cardiovascular:     Heart sounds: Normal heart sounds.     Comments: No BL pitting edema, no calf tenderness.  Pulmonary:     Effort: Pulmonary effort is normal.     Breath sounds: Normal breath sounds. No wheezing.     Comments: Lungs are cleared to auscultation.  Chest:     Chest wall: No tenderness.  Abdominal:     General: Bowel sounds are normal. There is no distension.     Palpations: Abdomen is soft.     Tenderness: There is no abdominal tenderness.  Musculoskeletal:        General: No tenderness or deformity.     Cervical back: Normal range of motion.  Skin:    General: Skin is warm and dry.  Neurological:     Mental Status: He is alert and oriented to person, place, and time.     ED Results / Procedures / Treatments   Labs (all labs ordered are listed, but only abnormal results are displayed) Labs Reviewed  BASIC METABOLIC PANEL - Abnormal; Notable for the following components:      Result Value   Glucose, Bld 161 (*)    All other components within normal limits  CBC  BRAIN NATRIURETIC PEPTIDE  TROPONIN I (HIGH SENSITIVITY)  TROPONIN I (HIGH SENSITIVITY)    EKG EKG Interpretation  Date/Time:  Thursday September 28 2019 08:12:23 EDT Ventricular Rate:  71 PR  Interval:  130 QRS Duration: 74 QT Interval:  356 QTC Calculation: 386 R Axis:   -2 Text Interpretation: Normal sinus rhythm Nonspecific T wave abnormality Abnormal ECG similar to Sep 10 2019 Confirmed by Sherwood Gambler [phone]) on 09/28/2019 8:34:59 AM   Radiology DG Chest 2 View  Result Date: 09/28/2019 CLINICAL DATA:  Chest pain EXAM: CHEST - 2 VIEW COMPARISON:  March 26, 2019 FINDINGS: Lungs are clear. Heart size and pulmonary vascular normal. There is a left-sided coronary stent, unchanged. No adenopathy. No pneumothorax. No bone lesions. IMPRESSION: Lungs clear. Coronary stent present on the left. Cardiac silhouette stable and within normal limits. Electronically Signed   By: Lowella Grip III M.D.   On: 09/28/2019 08:45    Procedures Procedures (including critical care time)  Medications Ordered in ED Medications  sodium chloride flush (NS) 0.9 % injection 3 mL (0 mLs Intravenous Hold 09/28/19 0849)    ED Course  I have reviewed the triage vital signs and the nursing notes.  Pertinent labs & imaging results that were available during my care of the patient were reviewed by me and considered in my medical decision making (see chart for details).    MDM Rules/Calculators/A&P   Patient the past medical history of CAD currently followed by Dr. Duwayne Heck of cardiology presents to the ED with chest burning sensation for the past month with exertion only.  Reports his symptoms have been ongoing, had some medications adjusted by Dr. Debara Pickett of cardiology but reports no improvement in his symptoms.  He also reports having "too much junk food lately ", which he thinks might be contributing to his symptoms.  Cording to his chart which I extensively  reviewed, he was seen via telemedicine by Dr. Markus Jarvis of cardiology on February 2021, this reported that since his last PCI intervention he had no improvement in chest discomfort, feels that this is somewhat persistent.  According to  Hilty's note, he has some concern for small vessel disease.  They tried increasing his Imdur 60 to 90 mg daily, for this is still not helping his symptoms according to patient.  He arrived in the ED with stable vital signs, no tachycardia or hypoxia.  Dates his pain is a 0 out of 10 at rest.  EKG remains unchanged from his previous.  The rotation of his labs by me, with a CBC within normal limits, denies any fevers.  BMP without any electrolyte derangement, creatinine level is within normal limits.  First troponin is 8, a BNP has been added as patient reports some shortness of breath although no pitting edema or prior history of CHF.  We will touch base with cardiology Dr. Debara Pickett for further recommendations.  Interpretation of his labs without any leukocytosis, no signs of anemia.  BMP without any electrolyte malady, kidney function is normal.  BNP is negative, chest x-ray does not show any consolidation or signs of pulmonary edema.  1:12 PM second page to cardiology, spoke to [place] who stated rounds she will be done soon.  02:15 PM spoke to patient and updated him on waiting status, he is aware he will need cardiology evaluation while in the ED.  Cardiology has been paged multiple times, pending disposition.  3:32 PM attempted to follow-up with cardiology, currently team is still working on the plan.  Patient will likely be disposition versus admit.  Patient care signed out to incoming team pending cardiology disposition.    Portions of this note were generated with [employer]. Dictation errors may occur despite best attempts at proofreading.  Final Clinical Impression(s) / ED Diagnoses Final diagnoses:  Unstable angina [facility])    Rx / DC Orders ED Discharge Orders    None       Janeece Fitting, PA-C 09/28/19 1534    Sherwood Gambler, MD 09/28/19 [phone]

## 2019-09-28 NOTE — H&P (Signed)
Cardiology Admission History and Physical:   Patient ID: [name] MRN: [id number]; DOB: 1961-06-19   Admission date: 09/28/2019  Primary Care Provider: Azzie Glatter, FNP Primary Cardiologist: Pixie Casino, MD  Primary Electrophysiologist:  None   Chief Complaint:  Chest pain  Patient Profile:   [name] is a 59 y.o. male with PMH of CAD s/p PCI to RCA, LCx, and 2nd marginal with subsequent PCI for ISR of LCx and 2nd marginal most recently in 09/2018, HTN, HLD, DM type 2, and depression, who presents with chest pain.   History of Present Illness:   [name] presented to the ED with complaints of exertional chest pain for the past month despite adjustment of his medications at his last visit with Dr. Debara Pickett. The chest pain occurs with activity and improves with rest. He describes it as a burning sensation in his chest. He has not tried any SL nitro to see if this would help. It sounds like symptoms resolve fairly quickly with rest. He has noticed some DOE recently. No associated diaphoresis, dizziness, lightheadedness, syncope, nausea, or vomiting. He has not appreciated that it takes less activity to bring on his discomfort but reports his symptoms do feel similar to angina prior to last LHC.   He was last evaluated by cardiology via a telemedicine visit with Dr. Debara Pickett 08/01/19, at which time he reported continue exertional angina which perhaps worsened with exercise recently. His imdur was increased from 60mg  to 90 mg daily with consideration to add Ranexa if symptoms persisted. His last ischemic evaluation was a [facility] 09/2018 which revealed 80% pLCx ISR and 80% 2nd marginal ISR, both managed with balloon angioplasty, with 25% mLAD and 20% pLCx stenosis. He was recommended for DAPT with aspirin and brilinta for 12 months with consideration of plavix monotherapy going forward.   Of note, he was recently admitted to [facility] for suicidal ideations and increased depression from  09/07/19-09/11/19. He feels like he left the hospital in the same condition as he arrived. He reports taking the antidepressants for 1 week following discharge but has subsequently stopped taking his medications. He denies active suicidal ideations. He reports eating "junk food" as a coping strategy over the past week. He has not established care with outpatient psychiatry.   ED course: VSS. Labs notable for electrolytes wnl, Cr 0.79, CBC wnl, Trop 8>8, BNP 31.4. EKG with NSR rate 71 bpm, non-specific t wave abnormalities, no STE/D, no significant change from previous. CXR without acute findings. Cardiology asked to evaluate for chest pain.   Past Medical History:  Diagnosis Date  . Allergies   . Arthritis   . Chest pain   . Chronic lower back pain   . Chronic pain of right wrist   . Coronary artery disease    a. Multiple prior caths/PCI. Cath 2013 with possible spasm of RCA, 70% ISR of mid LCx with subsequent DES to mLCx and prox LCX. b. H/o microvascular angina. c. Recurrent angina 08/2014 - s/p PTCA/DES to prox Cx, PTCA/CBA to OM1.  c. LHC 06/10/15 with patent stents and some ISR in LCX and OM-1 that was not flow limiting --> Rx   . Dyslipidemia    a. Intolerant to many statins except tolerating Livalo.  Marland Kitchen GERD (gastroesophageal reflux disease)   . H/O cardiac catheterization 10/25/2018  . Hypertension   . Myocardial infarction ([place]) ~ 2010  . S/P angioplasty with stent, DES, to proximal and mid LCX 12/15/11 12/15/2011  . Shoulder pain   .  Stroke [facility])    pt. reports had a stroke around time of MI 2010  . Type II diabetes mellitus ([place])   . Unstable angina [facility])     Past Surgical History:  Procedure Laterality Date  . CARDIAC CATHETERIZATION  06/15/2002   LAD with prox 40% stenosis, norma L main, Cfx with 25% lesion, RCA with long mid 25% stenosis (Dr. Vita Barley)  . CARDIAC CATHETERIZATION  04/01/2010   normal L main, LAD wit mild stenosis, L Cfx with 70% in-stent restenosis, RCA with  70% in-stent restenosis, LVEF >60% (Dr. K. Mali Hilty) - cutting ballon arthrectomy to RCA & Cfx (Dr. Rockne Menghini)  . CARDIAC CATHETERIZATION  08/25/2010   preserved global LV contractility; multivessel CAD, diffuse 90-95% in-stent restenosis in prox placed Cfx stent - cutting balloon arthrectomy in Cfx with multiple dilatations 90-95% to 0% stenosis (Dr. Corky Downs)  . CARDIAC CATHETERIZATION  01/26/2011   PCI & stenting of aggresive in-stent restenosis within previously stented AV groove Cfx with 3.0x40mm Taxus DES (previous stents were Promus) (Dr. Adora Fridge)  . CARDIAC CATHETERIZATION  05/11/2011   preserved LV function, 40% mid LAD stenosis, 30-40% narrowing proximal to stented semgnet of prox Cfx, patent mid RCA stent with smooth 20% narrowing in distal RCA (Dr. Corky Downs)  . CARDIAC CATHETERIZATION  12/15/2011   PCI & stenting of proximal & mid Cfx with DES - 3.0x47mm in proximal, 3.0x14mm in mid (Dr. Adora Fridge)  . CARDIAC CATHETERIZATION N/A 06/10/2015   Procedure: Left Heart Cath and Coronary Angiography;  Surgeon: Jolaine Artist, MD;  Location: [place] CV LAB;  Service: Cardiovascular;  Laterality: N/A;  . CARDIAC CATHETERIZATION  10/25/2018  . cardiometabolic testing  123456   good exercise effort, peak VO2 79% predicted with normal VO2 HR curves (mild deconditioning)  . COLONOSCOPY  12/2012   diminutive hyperplastic sigmoid poyp so repeat routine 2024  . CORONARY BALLOON ANGIOPLASTY N/A 10/25/2018   Procedure: CORONARY BALLOON ANGIOPLASTY;  Surgeon: Jettie Booze, MD;  Location: [place] CV LAB;  Service: Cardiovascular;  Laterality: N/A;  . EXCISIONAL HEMORRHOIDECTOMY  1984  . LEFT HEART CATH AND CORONARY ANGIOGRAPHY N/A 10/25/2018   Procedure: LEFT HEART CATH AND CORONARY ANGIOGRAPHY;  Surgeon: Jettie Booze, MD;  Location: [place] [facility];  Service: Cardiovascular;  Laterality: N/A;  . LEFT HEART CATHETERIZATION WITH CORONARY ANGIOGRAM N/A 05/11/2011    Procedure: LEFT HEART CATHETERIZATION WITH CORONARY ANGIOGRAM;  Surgeon: Troy Sine, MD;  Location: [facility] CATH LAB;  Service: Cardiovascular;  Laterality: N/A;  Possible percutaneous coronary intervention, possible IVUS  . LEFT HEART CATHETERIZATION WITH CORONARY ANGIOGRAM N/A 12/15/2011   Procedure: LEFT HEART CATHETERIZATION WITH CORONARY ANGIOGRAM;  Surgeon: Lorretta Harp, MD;  Location: [facility] CATH LAB;  Service: Cardiovascular;  Laterality: N/A;  . LEFT HEART CATHETERIZATION WITH CORONARY ANGIOGRAM N/A 09/05/2014   Procedure: LEFT HEART CATHETERIZATION WITH CORONARY ANGIOGRAM;  Surgeon: Burnell Blanks, MD;  Location: [facility] CATH LAB;  Service: Cardiovascular;  Laterality: N/A;  . LIPOMA EXCISION     back of the head  . NM MYOCAR PERF WALL MOTION  02/2012   lexiscan myoview; mild perfusion defect in mid inferolateral & basal inferolateral region (infarct/scar); EF 52%, abnormal but ow risk scan  . PERCUTANEOUS CORONARY STENT INTERVENTION (PCI-S)  09/05/2014   Procedure: PERCUTANEOUS CORONARY STENT INTERVENTION (PCI-S);  Surgeon: Burnell Blanks, MD;  Location: [facility] CATH LAB;  Service: Cardiovascular;;     Medications Prior to Admission: Prior to Admission  medications   Medication Sig Start Date End Date Taking? Authorizing Provider  amLODipine (NORVASC) 10 MG tablet Take 1 tablet (10 mg total) by mouth daily. 04/04/19  Yes Azzie Glatter, FNP  ARIPiprazole (ABILIFY) 2 MG tablet Take 1 tablet (2 mg total) by mouth daily. 09/12/19  Yes Johnn Hai, MD  aspirin 81 MG chewable tablet Chew 1 tablet (81 mg total) by mouth daily. 10/27/18  Yes Reino Bellis B, NP  co-enzyme Q-10 30 MG capsule Take 30 mg by mouth daily.   Yes [provider]  famotidine (PEPCID) 20 MG tablet Take 1 tablet (20 mg total) by mouth 2 (two) times daily. 09/11/19  Yes Johnn Hai, MD  isosorbide mononitrate (IMDUR) 60 MG 24 hr tablet Take 1.5 tablets (90 mg total) by mouth daily. 08/01/19  Yes Hilty, Nadean Corwin, MD  LIVALO 4 MG TABS TAKE 1 TABLET BY MOUTH ONCE DAILY IN THE MORNING Patient taking differently: Take 1 tablet by mouth every morning.  08/25/19  Yes Azzie Glatter, FNP  metFORMIN (GLUCOPHAGE) 500 MG tablet TAKE 1 TABLET BY MOUTH TWICE DAILY WITH A MEAL Patient taking differently: Take 500 mg by mouth 2 (two) times daily with a meal.  08/25/19  Yes Azzie Glatter, FNP  metoprolol tartrate (LOPRESSOR) 100 MG tablet Take 1 tablet by mouth twice daily Patient taking differently: Take 100 mg by mouth 2 (two) times daily.  07/31/19  Yes Azzie Glatter, FNP  mirtazapine (REMERON) 15 MG tablet Take 1 tablet (15 mg total) by mouth at bedtime. 09/11/19  Yes Johnn Hai, MD  ticagrelor (BRILINTA) 90 MG TABS tablet Take 1 tablet (90 mg total) by mouth 2 (two) times daily. 04/04/19  Yes Azzie Glatter, FNP  vortioxetine HBr (TRINTELLIX) 10 MG TABS tablet Take 1 tablet (10 mg total) by mouth daily. Patient not taking: Reported on 09/28/2019 09/11/19   Johnn Hai, MD     Allergies:    Allergies  Allergen Reactions  . Bee Venom Anaphylaxis and Hives  . Shellfish Allergy Anaphylaxis and Hives  . Statins Other (See Comments)    Myalgias. Tolerating livalo.   . Testosterone Cypionate     Testerone Injection --Increased breast tissue     Social History:   Social History   Socioeconomic History  . Marital status: Divorced    Spouse name: Not on file  . Number of children: 2  . Years of education: GED  . Highest education level: Not on file  Occupational History  . Not on file  Tobacco Use  . Smoking status: Former Smoker    Packs/day: 1.00    Years: 10.00    Pack years: 10.00    Types: Cigarettes    Quit date: 10/07/2018    Years since quitting: 0.9  . Smokeless tobacco: Never Used  Substance and Sexual Activity  . Alcohol use: Yes  . Drug use: No    Comment: 12/15/11 "last cocaine was 2010"  . Sexual activity: Yes  Other Topics Concern  . Not on file  Social History  Narrative  . Not on file   Social Determinants of Health   Financial Resource Strain:   . Difficulty of Paying Living Expenses:   Food Insecurity:   . Worried About Charity fundraiser in the Last Year:   . Arboriculturist in the Last Year:   Transportation Needs:   . Film/video editor (Medical):   Marland Kitchen Lack of Transportation (Non-Medical):   Physical  Activity:   . Days of Exercise per Week:   . Minutes of Exercise per Session:   Stress:   . Feeling of Stress :   Social Connections:   . Frequency of Communication with Friends and Family:   . Frequency of Social Gatherings with Friends and Family:   . Attends Religious Services:   . Active Member of Clubs or Organizations:   . Attends Archivist Meetings:   Marland Kitchen Marital Status:   Intimate Partner Violence:   . Fear of Current or Ex-Partner:   . Emotionally Abused:   Marland Kitchen Physically Abused:   . Sexually Abused:     Family History:   The patient's family history includes Cancer in his brother and paternal grandfather; Coronary artery disease in his paternal grandmother; Leukemia in his mother; Prostate cancer in his father.    ROS:  Please see the history of present illness.  All other ROS reviewed and negative.     Physical Exam/Data:   Vitals:   09/28/19 1200 09/28/19 1215 09/28/19 1245 09/28/19 1300  BP: 112/74 113/71 116/78 126/82  Pulse: (!) 57 (!) 58 62 60  Resp: 13 15 15 14   Temp:      TempSrc:      SpO2: 98% 99% 99% 99%  Weight:      Height:       No intake or output data in the 24 hours ending 09/28/19 1552 Last 3 Weights 09/28/2019 09/07/2019 08/01/2019  Weight (lbs) 175 lb 185 lb 182 lb 8 oz  Weight (kg) 79.379 kg 83.915 kg 82.781 kg     Body mass index is 26.61 kg/m.  General:  Well nourished, well developed, in no acute distress HEENT: sclera anicteric Lymph: no adenopathy Neck: no JVD Endocrine:  No thryomegaly Vascular: No carotid bruits; distal pulses 2+ bilaterally  Cardiac:  normal S1,  S2; RRR; no murmurs, rubs, or gallops Lungs:  clear to auscultation bilaterally, no wheezing, rhonchi or rales  Abd: soft, nontender, no hepatomegaly  Ext: no edema Musculoskeletal:  No deformities, BUE and BLE strength normal and equal Skin: warm and dry  Neuro:  CNs 2-12 intact, no focal abnormalities noted Psych:  Normal affect    EKG: NSR rate 71 bpm, non-specific t wave abnormalities, no STE/D, no significant change from previous  Relevant CV Studies:   Left heart catheterization 09/2018:  Previously placed Mid RCA stent (unknown type) is widely patent.  Prox Cx lesion is 80% stenosed, instent restenosis.  Scoring balloon angioplasty was performed using a BALLOON WOL[place]INE 3.50X10, followed by a 3.75 [place] balloon.  Post intervention, there is a 0% residual stenosis.  2nd Mrg lesion is 80% stenosed, instent restenosis.  Scoring balloon angioplasty was performed using a BALLOON WOLVERINE 3.50X10, followed by a 3.75 [place] balloon.  Post intervention, there is a 0% residual stenosis.  Mid LAD lesion is 25% stenosed.  Ost 2nd Mrg to 2nd Mrg lesion is 25% stenosed.  There is no aortic valve stenosis.  LV end diastolic pressure is normal.  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 Able to complete left circ PCI, but would not use right radial approach in the future.  Prox Cx to Mid Cx lesion is 20% stenosed.   Dual antiplatelet therapy for 12 months.    Consdier clopidogrel monotherapy after 1 year given the multiple layers of stents that he has.    Laboratory Data:  High Sensitivity Troponin:   Recent Labs  Lab 09/28/19 [phone]  09/28/19 0959  TROPONINIHS 8 8      Chemistry Recent Labs  Lab 09/28/19 0823  NA 140  K 4.4  CL 108  CO2 23  GLUCOSE 161*  BUN 15  CREATININE 0.79  CALCIUM 9.3  GFRNONAA >60  GFRAA >60  ANIONGAP 9    No results for input(s): PROT, ALBUMIN, AST, ALT, ALKPHOS, BILITOT in the last 168  hours. Hematology Recent Labs  Lab 09/28/19 0823  WBC 5.8  RBC 5.04  HGB 14.3  HCT 44.8  MCV 88.9  MCH 28.4  MCHC 31.9  RDW 12.2  PLT 252   BNP Recent Labs  Lab 09/28/19 0959  BNP 31.4    DDimer No results for input(s): DDIMER in the last 168 hours.   Radiology/Studies:  DG Chest 2 View  Result Date: 09/28/2019 CLINICAL DATA:  Chest pain EXAM: CHEST - 2 VIEW COMPARISON:  March 26, 2019 FINDINGS: Lungs are clear. Heart size and pulmonary vascular normal. There is a left-sided coronary stent, unchanged. No adenopathy. No pneumothorax. No bone lesions. IMPRESSION: Lungs clear. Coronary stent present on the left. Cardiac silhouette stable and within normal limits. Electronically Signed   By: Lowella Grip III M.D.   On: 09/28/2019 08:45        TIMI Risk Score for Unstable Angina or Non-ST Elevation MI:   The patient's TIMI risk score is 4, which indicates a 20% risk of all cause mortality, new or recurrent myocardial infarction or need for urgent revascularization in the next 14 days.   Assessment and Plan:   1. Unstable angina in patient with CAD s/p multiple interventions: patient presents with symptoms similar to angina prior to last LHC where he had PCI to ISR of LCx and 2nd marginal 09/2018. EKG without ischemic changes. HsTrop was negative x2.  - Will plan for LHC tomorrow - NPO after MN - Continue aspirin and brilinta - Continue imdur, amlodipine, and metoprolol - Continue pitavastatin  2. HTN: BP stable - Continue amlodipine, metoprolol, and imdur  3. HLD: LDL 110; intolerant to most statins but on pitavastatin - Continue pravastatin inpatient with plans to resume pitavastatin at discharge - Consider PSK9 inhibitor outpatient   4. DM type 2: A1C 6.2 09/07/19; on metformin at home - ISS while inpatient - Anticipate resuming metformin at discharge  5. MDD: recent admission for depression and suicidal ideations. He denies active suicidal thought now. He  still feels quite depressed and reports stopping vortioxetine 1 week ago. He has not established care with outpatient psychiatry - Continue remeron and abilify - Will place case management order to assist with establishing psych care outpatient   Severity of Illness: The appropriate patient status for this patient is OBSERVATION. Observation status is judged to be reasonable and necessary in order to provide the required intensity of service to ensure the patient's safety. The patient's presenting symptoms, physical exam findings, and initial radiographic and laboratory data in the context of their medical condition is felt to place them at decreased risk for further clinical deterioration. Furthermore, it is anticipated that the patient will be medically stable for discharge from the hospital within 2 midnights of admission. The following factors support the patient status of observation.   " The patient's presenting symptoms include chest pain. " The physical exam findings include benign cardiopulmonary exam. " The initial radiographic and laboratory data are non-specific T wave abnormalities on EKG, negative troponins, negative CXR.     For questions or updates, please contact [place]  Please consult [email or web address] for contact info under        Signed, Abigail Butts, PA-C  09/28/2019 3:52 PM

## 2019-09-29 ENCOUNTER — Encounter (HOSPITAL_COMMUNITY)
Admission: EM | Disposition: A | Discharge status: Home / Self Care | Payer: Self-pay | Source: Home / Self Care | Attending: Cardiovascular Disease

## 2019-09-29 ENCOUNTER — Inpatient Hospital Stay (HOSPITAL_COMMUNITY): Payer: Medicaid Other

## 2019-09-29 DIAGNOSIS — R079 Chest pain, unspecified: Secondary | ICD-10-CM

## 2019-09-29 DIAGNOSIS — I25110 Atherosclerotic heart disease of native coronary artery with unstable angina pectoris: Secondary | ICD-10-CM

## 2019-09-29 HISTORY — PX: INTRAVASCULAR ULTRASOUND/IVUS: CATH118244

## 2019-09-29 HISTORY — PX: CORONARY BALLOON ANGIOPLASTY: CATH118233

## 2019-09-29 HISTORY — PX: LEFT HEART CATH AND CORONARY ANGIOGRAPHY: CATH118249

## 2019-09-29 LAB — BASIC METABOLIC PANEL
Anion gap: 10 (ref 5–15)
BUN: 13 mg/dL (ref 6–20)
CO2: 24 mmol/L (ref 22–32)
Calcium: 9 mg/dL (ref 8.9–10.3)
Chloride: 107 mmol/L (ref 98–111)
Creatinine, Ser: 0.82 mg/dL (ref 0.61–1.24)
GFR calc Af Amer: >60 mL/min (ref >60)
GFR calc non Af Amer: >60 mL/min (ref >60)
Glucose, Bld: 162 mg/dL — ABNORMAL HIGH (ref 70–99)
Potassium: 4 mmol/L (ref 3.5–5.1)
Sodium: 141 mmol/L (ref 135–145)

## 2019-09-29 LAB — GLUCOSE, CAPILLARY
Glucose-Capillary: 158 mg/dL — ABNORMAL HIGH (ref 70–99)
Glucose-Capillary: 129 mg/dL — ABNORMAL HIGH (ref 70–99)
Glucose-Capillary: 206 mg/dL — ABNORMAL HIGH (ref 70–99)
Glucose-Capillary: 177 mg/dL — ABNORMAL HIGH (ref 70–99)

## 2019-09-29 LAB — POCT ACTIVATED CLOTTING TIME
Activated Clotting Time: 257 seconds
Activated Clotting Time: 279 seconds
Activated Clotting Time: 285 seconds

## 2019-09-29 LAB — ECHOCARDIOGRAM COMPLETE
Height: 68 in
Weight: 3072.33 oz

## 2019-09-29 SURGERY — LEFT HEART CATH AND CORONARY ANGIOGRAPHY
Anesthesia: LOCAL

## 2019-09-29 MED ORDER — FENTANYL CITRATE (PF) 100 MCG/2ML IJ SOLN
INTRAMUSCULAR | Status: AC
  Filled 2019-09-29: qty 2

## 2019-09-29 MED ORDER — HEPARIN (PORCINE) IN NACL 1000-0.9 UT/500ML-% IV SOLN
INTRAVENOUS | Status: AC
  Filled 2019-09-29: qty 1000

## 2019-09-29 MED ORDER — METOPROLOL TARTRATE 50 MG PO TABS
50.0000 mg | ORAL_TABLET | Freq: Two times a day (BID) | ORAL | Status: DC
  Administered 2019-09-29 – 2019-09-30 (×2): 50 mg via ORAL
  Filled 2019-09-29: qty 2
  Filled 2019-09-29: qty 1

## 2019-09-29 MED ORDER — VERAPAMIL HCL 2.5 MG/ML IV SOLN
INTRAVENOUS | Status: AC
  Filled 2019-09-29: qty 2

## 2019-09-29 MED ORDER — SODIUM CHLORIDE 0.9 % IV SOLN: 250.0000 mL | INTRAVENOUS | Status: DC | PRN

## 2019-09-29 MED ORDER — NITROGLYCERIN 1 MG/10 ML FOR IR/CATH LAB
INTRA_ARTERIAL | Status: DC | PRN
  Administered 2019-09-29 (×5): 200 ug via INTRACORONARY

## 2019-09-29 MED ORDER — SODIUM CHLORIDE 0.9 % IV SOLN: INTRAVENOUS | Status: AC

## 2019-09-29 MED ORDER — MIDAZOLAM HCL 2 MG/2ML IJ SOLN
INTRAMUSCULAR | Status: AC
  Filled 2019-09-29: qty 2

## 2019-09-29 MED ORDER — MIDAZOLAM HCL 2 MG/2ML IJ SOLN
INTRAMUSCULAR | Status: DC | PRN
  Administered 2019-09-29 (×3): 1 mg via INTRAVENOUS

## 2019-09-29 MED ORDER — HYDRALAZINE HCL 20 MG/ML IJ SOLN: 10.0000 mg | INTRAMUSCULAR | Status: AC | PRN

## 2019-09-29 MED ORDER — VERAPAMIL HCL 2.5 MG/ML IV SOLN
INTRAVENOUS | Status: DC | PRN
  Administered 2019-09-29: 10 mL via INTRA_ARTERIAL

## 2019-09-29 MED ORDER — FENTANYL CITRATE (PF) 100 MCG/2ML IJ SOLN
INTRAMUSCULAR | Status: DC | PRN
  Administered 2019-09-29 (×4): 25 ug via INTRAVENOUS

## 2019-09-29 MED ORDER — HEPARIN (PORCINE) IN NACL 1000-0.9 UT/500ML-% IV SOLN
INTRAVENOUS | Status: DC | PRN
  Administered 2019-09-29 (×2): 500 mL

## 2019-09-29 MED ORDER — IOHEXOL 350 MG/ML SOLN
INTRAVENOUS | Status: DC | PRN
  Administered 2019-09-29: 11:00:00 125 mL

## 2019-09-29 MED ORDER — LABETALOL HCL 5 MG/ML IV SOLN: 10.0000 mg | INTRAVENOUS | Status: AC | PRN

## 2019-09-29 MED ORDER — NITROGLYCERIN IN D5W 200-5 MCG/ML-% IV SOLN
0.0000 ug/min | INTRAVENOUS | Status: DC
  Administered 2019-09-29: 12:00:00 20 ug/min via INTRAVENOUS
  Administered 2019-09-29: 25 ug/min via INTRAVENOUS

## 2019-09-29 MED ORDER — MORPHINE SULFATE (PF) 2 MG/ML IV SOLN
INTRAVENOUS | Status: AC
  Administered 2019-09-29: 2 mg via INTRAVENOUS
  Filled 2019-09-29: qty 1

## 2019-09-29 MED ORDER — LIDOCAINE HCL (PF) 1 % IJ SOLN
INTRAMUSCULAR | Status: AC
  Filled 2019-09-29: qty 30

## 2019-09-29 MED ORDER — NITROGLYCERIN IN D5W 200-5 MCG/ML-% IV SOLN
INTRAVENOUS | Status: AC | PRN
  Administered 2019-09-29: 10 ug/min via INTRAVENOUS

## 2019-09-29 MED ORDER — HEPARIN SODIUM (PORCINE) 1000 UNIT/ML IJ SOLN
INTRAMUSCULAR | Status: AC
  Filled 2019-09-29: qty 1

## 2019-09-29 MED ORDER — LIDOCAINE HCL (PF) 1 % IJ SOLN
INTRAMUSCULAR | Status: DC | PRN
  Administered 2019-09-29: 2 mL

## 2019-09-29 MED ORDER — HEPARIN SODIUM (PORCINE) 1000 UNIT/ML IJ SOLN
INTRAMUSCULAR | Status: DC | PRN
  Administered 2019-09-29: 4500 [IU] via INTRAVENOUS
  Administered 2019-09-29: 3000 [IU] via INTRAVENOUS
  Administered 2019-09-29: 4500 [IU] via INTRAVENOUS
  Administered 2019-09-29: 2000 [IU] via INTRAVENOUS

## 2019-09-29 MED ORDER — SODIUM CHLORIDE 0.9% FLUSH: 3.0000 mL | INTRAVENOUS | Status: DC | PRN

## 2019-09-29 MED ORDER — ENOXAPARIN SODIUM 40 MG/0.4ML ~~LOC~~ SOLN
40.0000 mg | SUBCUTANEOUS | Status: DC
  Administered 2019-09-30: 40 mg via SUBCUTANEOUS
  Filled 2019-09-29: qty 0.4

## 2019-09-29 MED ORDER — MORPHINE SULFATE (PF) 2 MG/ML IV SOLN: 2.0000 mg | Freq: Once | INTRAVENOUS | Status: AC

## 2019-09-29 MED ORDER — MORPHINE SULFATE (PF) 2 MG/ML IV SOLN: 2.0000 mg | INTRAVENOUS | Status: DC | PRN

## 2019-09-29 MED ORDER — NITROGLYCERIN 1 MG/10 ML FOR IR/CATH LAB
INTRA_ARTERIAL | Status: AC
  Filled 2019-09-29: qty 10

## 2019-09-29 MED ORDER — SODIUM CHLORIDE 0.9% FLUSH
3.0000 mL | Freq: Two times a day (BID) | INTRAVENOUS | Status: DC
  Administered 2019-09-29 (×2): 3 mL via INTRAVENOUS

## 2019-09-29 MED ORDER — NITROGLYCERIN IN D5W 200-5 MCG/ML-% IV SOLN
INTRAVENOUS | Status: AC
  Filled 2019-09-29: qty 250

## 2019-09-29 SURGICAL SUPPLY — 20 items
BALLN SAPPHIRE ~~LOC~~ 3.0X15 (BALLOONS) ×1 IMPLANT
BALLN SAPPHIRE ~~LOC~~ 3.75X12 (BALLOONS) ×1 IMPLANT
BALLN WOLVERINE 3.00X10 (BALLOONS) ×2
BALLN WOLVERINE 3.50X10 (BALLOONS) ×2
BALLOON WOLVERINE 3.00X10 (BALLOONS) IMPLANT
BALLOON WOLVERINE 3.50X10 (BALLOONS) IMPLANT
CATH IMPULSE 5F ANG/FL3.5 (CATHETERS) ×1 IMPLANT
CATH LAUNCHER 6FR EBU3.5 (CATHETERS) ×1 IMPLANT
CATH OPTICROSS 40MHZ (CATHETERS) ×1 IMPLANT
DEVICE RAD COMP TR BAND LRG (VASCULAR PRODUCTS) ×1 IMPLANT
GLIDESHEATH SLEND SS 6F .021 (SHEATH) ×1 IMPLANT
GUIDEWIRE INQWIRE 1.5J.035X260 (WIRE) IMPLANT
INQWIRE 1.5J .035X260CM (WIRE) ×2
KIT ENCORE 26 ADVANTAGE (KITS) ×1 IMPLANT
KIT HEART LEFT (KITS) ×2 IMPLANT
PACK CARDIAC CATHETERIZATION (CUSTOM PROCEDURE TRAY) ×2 IMPLANT
SLED PULL BACK IVUS (MISCELLANEOUS) ×1 IMPLANT
TRANSDUCER W/STOPCOCK (MISCELLANEOUS) ×2 IMPLANT
TUBING CIL FLEX 10 FLL-RA (TUBING) ×2 IMPLANT
WIRE RUNTHROUGH .014X180CM (WIRE) ×1 IMPLANT

## 2019-09-29 NOTE — H&P (View-Only) (Signed)
Cardiology Progress Note  Patient ID: ARMAN [name] MRN: [id number] DOB: 1961/06/13 Date of Encounter: 09/29/2019  Primary Cardiologist: Pixie Casino, MD  Subjective  No chest pain overnight. No symptoms.   ROS:  All other ROS reviewed and negative. Pertinent positives noted in the HPI.     Inpatient Medications  Scheduled Meds: . [MAR Hold] amLODipine  10 mg Oral Daily  . [MAR Hold] ARIPiprazole  2 mg Oral Daily  . [MAR Hold] aspirin  81 mg Oral Daily  . [MAR Hold] famotidine  20 mg Oral BID  . [MAR Hold] heparin  5,000 Units Subcutaneous Q8H  . [MAR Hold] insulin aspart  0-15 Units Subcutaneous TID WC  . [MAR Hold] insulin aspart  0-5 Units Subcutaneous QHS  . [MAR Hold] isosorbide mononitrate  90 mg Oral Daily  . [MAR Hold] metoprolol tartrate  100 mg Oral BID  . [MAR Hold] mirtazapine  15 mg Oral QHS  . [MAR Hold] pravastatin  80 mg Oral q1800  . [MAR Hold] sodium chloride flush  3 mL Intravenous Once  . [MAR Hold] sodium chloride flush  3 mL Intravenous Q12H  . [MAR Hold] ticagrelor  90 mg Oral BID   Continuous Infusions: . sodium chloride    . sodium chloride 1 mL/kg/hr (09/29/19 0517)   PRN Meds: sodium chloride, [MAR Hold] acetaminophen, [MAR Hold] nitroGLYCERIN, [MAR Hold] ondansetron (ZOFRAN) IV, sodium chloride flush   Vital Signs   Vitals:   09/28/19 1805 09/28/19 2032 09/28/19 2342 09/29/19 0436  BP: 139/89 110/70 108/75 105/79  Pulse:  74 64 (!) 58  Resp: 15 17 20 17   Temp: 98.1 F (36.7 C) 98.2 F (36.8 C) 97.7 F (36.5 C) 98.2 F (36.8 C)  TempSrc: Oral Oral Oral Oral  SpO2: 100% 98% 98% 98%  Weight:    87.1 kg  Height:        Intake/Output Summary (Last 24 hours) at 09/29/2019 0911 Last data filed at 09/29/2019 0600 Gross per 24 hour  Intake 863.75 ml  Output 1075 ml  Net -211.25 ml   Last 3 Weights 09/29/2019 09/28/2019 09/28/2019  Weight (lbs) 192 lb 0.3 oz 193 lb 11.2 oz 175 lb  Weight (kg) 87.1 kg 87.862 kg 79.379 kg  Some encounter  information is confidential and restricted. Go to Review Flowsheets activity to see all data.      Telemetry  Overnight telemetry shows NSR, which I personally reviewed.   ECG  The most recent ECG shows SB 56 bpm, nonspecific STT, which I personally reviewed.   Physical Exam   Vitals:   09/28/19 1805 09/28/19 2032 09/28/19 2342 09/29/19 0436  BP: 139/89 110/70 108/75 105/79  Pulse:  74 64 (!) 58  Resp: 15 17 20 17   Temp: 98.1 F (36.7 C) 98.2 F (36.8 C) 97.7 F (36.5 C) 98.2 F (36.8 C)  TempSrc: Oral Oral Oral Oral  SpO2: 100% 98% 98% 98%  Weight:    87.1 kg  Height:         Intake/Output Summary (Last 24 hours) at 09/29/2019 0911 Last data filed at 09/29/2019 0600 Gross per 24 hour  Intake 863.75 ml  Output 1075 ml  Net -211.25 ml    Last 3 Weights 09/29/2019 09/28/2019 09/28/2019  Weight (lbs) 192 lb 0.3 oz 193 lb 11.2 oz 175 lb  Weight (kg) 87.1 kg 87.862 kg 79.379 kg  Some encounter information is confidential and restricted. Go to Review Flowsheets activity to see all data.  Body mass index is 29.2 kg/m.  General: Well nourished, well developed, in no acute distress Head: Atraumatic, normal size  Eyes: PEERLA, EOMI  Neck: Supple, no JVD Endocrine: No thryomegaly Cardiac: Normal S1, S2; RRR; no murmurs, rubs, or gallops Lungs: Clear to auscultation bilaterally, no wheezing, rhonchi or rales  Abd: Soft, nontender, no hepatomegaly  Ext: No edema, pulses 2+ Musculoskeletal: No deformities, BUE and BLE strength normal and equal Skin: Warm and dry, no rashes   Neuro: Alert and oriented to person, place, time, and situation, CNII-XII grossly intact, no focal deficits  Psych: Normal mood and affect   Labs  High Sensitivity Troponin:   Recent Labs  Lab 09/28/19 0823 09/28/19 0959  TROPONINIHS 8 8     Cardiac EnzymesNo results for input(s): TROPONINI in the last 168 hours. No results for input(s): TROPIPOC in the last 168 hours.  Chemistry Recent Labs  Lab  09/28/19 0823 09/28/19 1646 09/29/19 0315  NA 140  --  141  K 4.4  --  4.0  CL 108  --  107  CO2 23  --  24  GLUCOSE 161*  --  162*  BUN 15  --  13  CREATININE 0.79 0.74 0.82  CALCIUM 9.3  --  9.0  GFRNONAA >60 >60 >60  GFRAA >60 >60 >60  ANIONGAP 9  --  10    Hematology Recent Labs  Lab 09/28/19 0823 09/28/19 1825  WBC 5.8 6.4  RBC 5.04 4.74  HGB 14.3 13.5  HCT 44.8 41.6  MCV 88.9 87.8  MCH 28.4 28.5  MCHC 31.9 32.5  RDW 12.2 12.3  PLT 252 233   BNP Recent Labs  Lab 09/28/19 0959  BNP 31.4    DDimer No results for input(s): DDIMER in the last 168 hours.   Radiology  DG Chest 2 View  Result Date: 09/28/2019 CLINICAL DATA:  Chest pain EXAM: CHEST - 2 VIEW COMPARISON:  March 26, 2019 FINDINGS: Lungs are clear. Heart size and pulmonary vascular normal. There is a left-sided coronary stent, unchanged. No adenopathy. No pneumothorax. No bone lesions. IMPRESSION: Lungs clear. Coronary stent present on the left. Cardiac silhouette stable and within normal limits. Electronically Signed   By: Lowella Grip III M.D.   On: 09/28/2019 08:45    Patient Profile  [name] is a 59 y.o. male with PMH of CAD s/p PCI to RCA, LCx, and 2nd marginal with subsequent PCI for ISR of LCx and 2nd marginal most recently in 09/2018, HTN, HLD, DM type 2, and depression, who was admitted 09/28/2019 for unstable angina.   Assessment & Plan   1. Unstable angina in patient with CAD s/p multiple interventions -presented with progressive stable angina symptoms not responding medical therapy. No further options but LHC. EKG without ischemic changes and troponin negative.  -Continue aspirin and brilinta -Continue imdur, amlodipine, and metoprolol -Continue pitavastatin -dc home today if not intervention and add ranexa   2. HTN - Continue amlodipine, metoprolol, and imdur  3. HLD -LDL 110; intolerant to most statins but on pitavastatin -Continue pravastatin inpatient with plans to  resume pitavastatin at discharge -Consider PSK9 inhibitor outpatient   4. DM type 2 -A1C 6.2 09/07/19; on metformin at home -ISS while inpatient -Anticipate resuming metformin at discharge  5. MDD -Continue remeron and abilify -Will place case management order to assist with establishing psych care outpatient   FEN -IVF pre cath -npo -dvt ppx: heparin  -code: full   For questions or updates,  please contact [place] Please consult [email or web address] for contact info under   Time Spent with Patient: I have spent a total of 25 minutes with patient reviewing hospital notes, telemetry, EKGs, labs and examining the patient as well as establishing an assessment and plan that was discussed with the patient.  > 50% of time was spent in direct patient care.    Signed, Addison Naegeli. Audie Box, [place]  09/29/2019 9:11 AM

## 2019-09-29 NOTE — Progress Notes (Signed)
  Echocardiogram 2D Echocardiogram has been performed.  [name] 09/29/2019, 9:21 AM

## 2019-09-29 NOTE — Plan of Care (Signed)
  Problem: Education: Goal: Knowledge of General Education information will improve Description Including pain rating scale, medication(s)/side effects and non-pharmacologic comfort measures Outcome: Progressing   

## 2019-09-29 NOTE — Brief Op Note (Signed)
BRIEF CARDIAC CATHETERIZATION NOTE  09/29/2019  11:28 AM  PATIENT:  [name]  59 y.o. male  PRE-OPERATIVE DIAGNOSIS:  Unstable angina  POST-OPERATIVE DIAGNOSIS:  Same  PROCEDURE:  Procedure(s): LEFT HEART CATH AND CORONARY ANGIOGRAPHY (N/A) Intravascular Ultrasound/IVUS (N/A) CORONARY BALLOON ANGIOPLASTY (N/A)  SURGEON:  Surgeon(s) and Role:    Nelva Bush, MD - Primary  FINDINGS: 1. Severe single-vessel coronary artery disease with diffuse in-stent restenosis of the LCx/OM2 stent with up to 95% narrowing.  There is also at least 80% stenosis involving the AV-groove LCx at the takeoff of OM2 with TIMI-2 flow at the start of the case and TIMI-1 flow following PTCA of the LCx/OM2 stents. 2. Mild to moderate stenosis of the mid LAD with improvement with intracoronary nitroglycerin, consistent with a vasospasm. 3. Patent RCA stent. 4. Mildly elevated LVEDP (~20 mmHg). 5. Challenging IVUS-guided balloon angioplasty of LCx/OM2 in-stent restenosis with reduction in stenosis from 95% to 30%.  RECOMMENDATIONS: 1. Aggressive medical therapy, including at least 12 months DAPT (ideally longer). 2. Titrate IV NTG for relief of chest pain, most likely due to TIMI-1 flow in jailed AV-groove LCx. 3. If patient has recurrent angina refractory to medical therapy and worsening in-stent restenosis, single-vessel CABG, brachytherapy, or drug-coated balloon angioplasty (currently investigational only) may need to be considered.  Nelva Bush, MD [facility]

## 2019-09-29 NOTE — Interval H&P Note (Signed)
History and Physical Interval Note:  09/29/2019 9:40 AM  [name]  has presented today for cardiac catheterization, with the diagnosis of unstable angina.  The various methods of treatment have been discussed with the patient and family. After consideration of risks, benefits and other options for treatment, the patient has consented to  Procedure(s): LEFT HEART CATH AND CORONARY ANGIOGRAPHY (N/A) as a surgical intervention.  The patient's history has been reviewed, patient examined, no change in status, stable for surgery.  I have reviewed the patient's chart and labs.  Questions were answered to the patient's satisfaction.    Cath Lab Visit (complete for each Cath Lab visit)  Clinical Evaluation Leading to the Procedure:   ACS: Yes.    Non-ACS:  N/A  Fran Neiswonger

## 2019-09-29 NOTE — Significant Event (Signed)
After cath, Mr. Horvat had severe chest pain. Per review of cath films and discussion with Dr. Saunders Revel. A small distal LCX was jailed during the procedure. This vessel was too small for intervention. He was treated medically with morphine and nitroglycerin. He did well and nursing did a great job of taking care of him.   [place] T. Audie Box, [place]  [address], [place], [place] [address] [phone]  7:22 PM

## 2019-09-29 NOTE — Progress Notes (Signed)
Cardiology Progress Note  Patient ID: [name] MRN: [id number] DOB: Apr 18, 1961 Date of Encounter: 09/29/2019  Primary Cardiologist: Pixie Casino, MD  Subjective  No chest pain overnight. No symptoms.   ROS:  All other ROS reviewed and negative. Pertinent positives noted in the HPI.     Inpatient Medications  Scheduled Meds: . [MAR Hold] amLODipine  10 mg Oral Daily  . [MAR Hold] ARIPiprazole  2 mg Oral Daily  . [MAR Hold] aspirin  81 mg Oral Daily  . [MAR Hold] famotidine  20 mg Oral BID  . [MAR Hold] heparin  5,000 Units Subcutaneous Q8H  . [MAR Hold] insulin aspart  0-15 Units Subcutaneous TID WC  . [MAR Hold] insulin aspart  0-5 Units Subcutaneous QHS  . [MAR Hold] isosorbide mononitrate  90 mg Oral Daily  . [MAR Hold] metoprolol tartrate  100 mg Oral BID  . [MAR Hold] mirtazapine  15 mg Oral QHS  . [MAR Hold] pravastatin  80 mg Oral q1800  . [MAR Hold] sodium chloride flush  3 mL Intravenous Once  . [MAR Hold] sodium chloride flush  3 mL Intravenous Q12H  . [MAR Hold] ticagrelor  90 mg Oral BID   Continuous Infusions: . sodium chloride    . sodium chloride 1 mL/kg/hr (09/29/19 0517)   PRN Meds: sodium chloride, [MAR Hold] acetaminophen, [MAR Hold] nitroGLYCERIN, [MAR Hold] ondansetron (ZOFRAN) IV, sodium chloride flush   Vital Signs   Vitals:   09/28/19 1805 09/28/19 2032 09/28/19 2342 09/29/19 0436  BP: 139/89 110/70 108/75 105/79  Pulse:  74 64 (!) 58  Resp: 15 17 20 17   Temp: 98.1 F (36.7 C) 98.2 F (36.8 C) 97.7 F (36.5 C) 98.2 F (36.8 C)  TempSrc: Oral Oral Oral Oral  SpO2: 100% 98% 98% 98%  Weight:    87.1 kg  Height:        Intake/Output Summary (Last 24 hours) at 09/29/2019 0911 Last data filed at 09/29/2019 0600 Gross per 24 hour  Intake 863.75 ml  Output 1075 ml  Net -211.25 ml   Last 3 Weights 09/29/2019 09/28/2019 09/28/2019  Weight (lbs) 192 lb 0.3 oz 193 lb 11.2 oz 175 lb  Weight (kg) 87.1 kg 87.862 kg 79.379 kg  Some encounter  information is confidential and restricted. Go to Review Flowsheets activity to see all data.      Telemetry  Overnight telemetry shows NSR, which I personally reviewed.   ECG  The most recent ECG shows SB 56 bpm, nonspecific STT, which I personally reviewed.   Physical Exam   Vitals:   09/28/19 1805 09/28/19 2032 09/28/19 2342 09/29/19 0436  BP: 139/89 110/70 108/75 105/79  Pulse:  74 64 (!) 58  Resp: 15 17 20 17   Temp: 98.1 F (36.7 C) 98.2 F (36.8 C) 97.7 F (36.5 C) 98.2 F (36.8 C)  TempSrc: Oral Oral Oral Oral  SpO2: 100% 98% 98% 98%  Weight:    87.1 kg  Height:         Intake/Output Summary (Last 24 hours) at 09/29/2019 0911 Last data filed at 09/29/2019 0600 Gross per 24 hour  Intake 863.75 ml  Output 1075 ml  Net -211.25 ml    Last 3 Weights 09/29/2019 09/28/2019 09/28/2019  Weight (lbs) 192 lb 0.3 oz 193 lb 11.2 oz 175 lb  Weight (kg) 87.1 kg 87.862 kg 79.379 kg  Some encounter information is confidential and restricted. Go to Review Flowsheets activity to see all data.  Body mass index is 29.2 kg/m.  General: Well nourished, well developed, in no acute distress Head: Atraumatic, normal size  Eyes: PEERLA, EOMI  Neck: Supple, no JVD Endocrine: No thryomegaly Cardiac: Normal S1, S2; RRR; no murmurs, rubs, or gallops Lungs: Clear to auscultation bilaterally, no wheezing, rhonchi or rales  Abd: Soft, nontender, no hepatomegaly  Ext: No edema, pulses 2+ Musculoskeletal: No deformities, BUE and BLE strength normal and equal Skin: Warm and dry, no rashes   Neuro: Alert and oriented to person, place, time, and situation, CNII-XII grossly intact, no focal deficits  Psych: Normal mood and affect   Labs  High Sensitivity Troponin:   Recent Labs  Lab 09/28/19 0823 09/28/19 0959  TROPONINIHS 8 8     Cardiac EnzymesNo results for input(s): TROPONINI in the last 168 hours. No results for input(s): TROPIPOC in the last 168 hours.  Chemistry Recent Labs  Lab  09/28/19 0823 09/28/19 1646 09/29/19 0315  NA 140  --  141  K 4.4  --  4.0  CL 108  --  107  CO2 23  --  24  GLUCOSE 161*  --  162*  BUN 15  --  13  CREATININE 0.79 0.74 0.82  CALCIUM 9.3  --  9.0  GFRNONAA >60 >60 >60  GFRAA >60 >60 >60  ANIONGAP 9  --  10    Hematology Recent Labs  Lab 09/28/19 0823 09/28/19 1825  WBC 5.8 6.4  RBC 5.04 4.74  HGB 14.3 13.5  HCT 44.8 41.6  MCV 88.9 87.8  MCH 28.4 28.5  MCHC 31.9 32.5  RDW 12.2 12.3  PLT 252 233   BNP Recent Labs  Lab 09/28/19 0959  BNP 31.4    DDimer No results for input(s): DDIMER in the last 168 hours.   Radiology  DG Chest 2 View  Result Date: 09/28/2019 CLINICAL DATA:  Chest pain EXAM: CHEST - 2 VIEW COMPARISON:  March 26, 2019 FINDINGS: Lungs are clear. Heart size and pulmonary vascular normal. There is a left-sided coronary stent, unchanged. No adenopathy. No pneumothorax. No bone lesions. IMPRESSION: Lungs clear. Coronary stent present on the left. Cardiac silhouette stable and within normal limits. Electronically Signed   By: Lowella Grip III M.D.   On: 09/28/2019 08:45    Patient Profile  [name] is a 59 y.o. male with PMH of CAD s/p PCI to RCA, LCx, and 2nd marginal with subsequent PCI for ISR of LCx and 2nd marginal most recently in 09/2018, HTN, HLD, DM type 2, and depression, who was admitted 09/28/2019 for unstable angina.   Assessment & Plan   1. Unstable angina in patient with CAD s/p multiple interventions -presented with progressive stable angina symptoms not responding medical therapy. No further options but LHC. EKG without ischemic changes and troponin negative.  -Continue aspirin and brilinta -Continue imdur, amlodipine, and metoprolol -Continue pitavastatin -dc home today if not intervention and add ranexa   2. HTN - Continue amlodipine, metoprolol, and imdur  3. HLD -LDL 110; intolerant to most statins but on pitavastatin -Continue pravastatin inpatient with plans to  resume pitavastatin at discharge -Consider PSK9 inhibitor outpatient   4. DM type 2 -A1C 6.2 09/07/19; on metformin at home -ISS while inpatient -Anticipate resuming metformin at discharge  5. MDD -Continue remeron and abilify -Will place case management order to assist with establishing psych care outpatient   FEN -IVF pre cath -npo -dvt ppx: heparin  -code: full   For questions or updates,  please contact [place] Please consult [email or web address] for contact info under   Time Spent with Patient: I have spent a total of 25 minutes with patient reviewing hospital notes, telemetry, EKGs, labs and examining the patient as well as establishing an assessment and plan that was discussed with the patient.  > 50% of time was spent in direct patient care.    Signed, Addison Naegeli. Audie Box, [place]  09/29/2019 9:11 AM

## 2019-09-29 NOTE — Care Management (Signed)
1417 09-29-19 Case Manager received consult for outpatient psychiatry. Patient currently goes to [facility]. Additional outpatient resources provided to the patient. Bethena Roys, RN,BSN Case Manager

## 2019-09-30 DIAGNOSIS — E7800 Pure hypercholesterolemia, unspecified: Secondary | ICD-10-CM

## 2019-09-30 DIAGNOSIS — I200 Unstable angina: Secondary | ICD-10-CM

## 2019-09-30 DIAGNOSIS — Z9861 Coronary angioplasty status: Secondary | ICD-10-CM

## 2019-09-30 DIAGNOSIS — I2510 Atherosclerotic heart disease of native coronary artery without angina pectoris: Secondary | ICD-10-CM

## 2019-09-30 DIAGNOSIS — I10 Essential (primary) hypertension: Secondary | ICD-10-CM

## 2019-09-30 LAB — BASIC METABOLIC PANEL
Anion gap: 9 (ref 5–15)
BUN: 15 mg/dL (ref 6–20)
CO2: 22 mmol/L (ref 22–32)
Calcium: 8.8 mg/dL — ABNORMAL LOW (ref 8.9–10.3)
Chloride: 105 mmol/L (ref 98–111)
Creatinine, Ser: 0.84 mg/dL (ref 0.61–1.24)
GFR calc Af Amer: >60 mL/min (ref >60)
GFR calc non Af Amer: >60 mL/min (ref >60)
Glucose, Bld: 263 mg/dL — ABNORMAL HIGH (ref 70–99)
Potassium: 4.2 mmol/L (ref 3.5–5.1)
Sodium: 136 mmol/L (ref 135–145)

## 2019-09-30 LAB — CBC
HCT: 37.5 % — ABNORMAL LOW (ref 39.0–52.0)
Hemoglobin: 12.2 g/dL — ABNORMAL LOW (ref 13.0–17.0)
MCH: 28.9 pg (ref 26.0–34.0)
MCHC: 32.5 g/dL (ref 30.0–36.0)
MCV: 88.9 fL (ref 80.0–100.0)
Platelets: 216 10*3/uL (ref 150–400)
RBC: 4.22 MIL/uL (ref 4.22–5.81)
RDW: 12.3 % (ref 11.5–15.5)
WBC: 6.9 10*3/uL (ref 4.0–10.5)
nRBC: 0 % (ref 0.0–0.2)

## 2019-09-30 LAB — GLUCOSE, CAPILLARY: Glucose-Capillary: 225 mg/dL — ABNORMAL HIGH (ref 70–99)

## 2019-09-30 MED ORDER — RANOLAZINE ER 500 MG PO TB12: 500.0000 mg | ORAL_TABLET | Freq: Two times a day (BID) | ORAL | 6 refills | Status: DC

## 2019-09-30 MED ORDER — NITROGLYCERIN 0.4 MG SL SUBL: 0.4000 mg | SUBLINGUAL_TABLET | SUBLINGUAL | 12 refills | Status: DC | PRN

## 2019-09-30 MED ORDER — RANOLAZINE ER 500 MG PO TB12: 500.0000 mg | ORAL_TABLET | Freq: Two times a day (BID) | ORAL | Status: DC

## 2019-09-30 NOTE — Discharge Summary (Signed)
Discharge Summary    Patient ID: [name] MRN: [id number]; DOB: 1960/07/01  Admit date: 09/28/2019 Discharge date: 09/30/2019  Primary Care Provider: Azzie Glatter, FNP  Primary Cardiologist: Pixie Casino, MD   Discharge Diagnoses    Principal Problem:   Unstable angina [facility]) Active Problems:   DM2 (diabetes mellitus, type 2) ([place])   CAD S/P percutaneous coronary angioplasty   Hypertension   Hyperlipidemia   MDD (major depressive disorder), severe ([place])  Diagnostic Studies/Procedures    End, Harrell Gave, MD (Primary)    Procedures  CORONARY BALLOON ANGIOPLASTY  09/29/19  Intravascular Ultrasound/IVUS  LEFT HEART CATH AND CORONARY ANGIOGRAPHY  Conclusion  Conclusions: 1. Severe single-vessel coronary artery disease with diffuse in-stent restenosis of the LCx/OM2 stent with up to 95% narrowing. There is also at least 80% stenosis involving the AV-groove LCx at the takeoff of OM2 with TIMI-2 flow at the start of the case and TIMI-1 flow following PTCA of the LCx/OM2 stents. 2. Mild to moderate stenosis of the mid LAD with improvement with intracoronary nitroglycerin, consistent with vasospasm. 3. Patent RCA stent. 4. Mildly elevated left ventricular filling pressure (LVEDP ~20 mmHg). 5. Challenging IVUS-guided balloon angioplasty of LCx/OM2 in-stent restenosis with reduction in stenosis from 95% to 30%.  Recommendations: 1. Aggressive medical therapy, including at least 12 months dual antiplatelet therapy (ideally longer). 2. Titrate IV nitroglycerin for relief of chest pain, most likely due to TIMI-1 flow in jailed AV-groove LCx. 3. If patient has recurrent angina refractory to medical therapy and worsening in-stent restenosis, single-vessel CABG, brachytherapy, or drug-coated balloon angioplasty (currently investigational only) may need to be considered. 4. Aggressive secondary prevention.  Nelva Bush, MD [facility]    Diagnostic Dominance:  Right  Intervention   Echo 09/29/19 1. Normal LV systolic function; grade 1 diastolic dysfunction; mild LVH.  2. Left ventricular ejection fraction, by estimation, is 55 to 60%. The  left ventricle has normal function. The left ventricle has no regional  wall motion abnormalities. There is mild left ventricular hypertrophy.  Left ventricular diastolic parameters  are consistent with Grade I diastolic dysfunction (impaired relaxation).  3. Right ventricular systolic function is normal. The right ventricular  size is normal.  4. The mitral valve is normal in structure. Trivial mitral valve  regurgitation. No evidence of mitral stenosis.  5. The aortic valve is tricuspid. Aortic valve regurgitation is not  visualized. No aortic stenosis is present.  6. The inferior vena cava is normal in size with greater than 50%  respiratory variability, suggesting right atrial pressure of 3 mmHg.    History of Present Illness     [name] is a 59 y.o. male with PMH of CAD s/p PCI to RCA, LCx, and 2nd marginal with subsequent PCI for ISR of LCx and 2nd marginal most recently in 09/2018, HTN, HLD, DM type 2, and depression who was admitted 09/28/2019 for unstable angina.   His last ischemic evaluation was a [facility] 09/2018 which revealed 80% pLCx ISR and 80% 2nd marginal ISR, both managed with balloon angioplasty, with 25% mLAD and 20% pLCx stenosis. He was recommended for DAPT with aspirin and brilinta for 12 months with consideration of plavix monotherapy going forward.   He was last evaluated by cardiology via a telemedicine visit with Dr. Debara Pickett 08/01/19, at which time he reported continue exertional angina which perhaps worsened with exercise recently. His imdur was increased from 60mg  to 90 mg daily with consideration to add Ranexa if symptoms persisted.  He was recently admitted to [facility] for suicidal ideations and increased depression from 09/07/19-09/11/19.  Presented to the ED with complaints of  exertional chest pain for the past month despite adjustment of his medications at his last visit with Dr. Debara Pickett. The chest pain occurs with activity and improves with rest. He describes it as a burning sensation in his chest. He has not tried any SL nitro to see if this would help. It sounds like symptoms resolve fairly quickly with rest. He has noticed some DOE recently. No associated diaphoresis, dizziness, lightheadedness, syncope, nausea, or vomiting. He has not appreciated that it takes less activity to bring on his discomfort but reports his symptoms do feel similar to angina prior to last LHC.   Hospital Course     Consultants: None  1. Unstable angina in patient with CAD s/p multiple interventions -Troponin negative. EKG without ischemic changes and troponin negative. Echo showed normal LV systolic function; grade 1 diastolic dysfunction; mild LVH. Cath showed severe single vessel CAD as above. S/p challenging IVUS-guided balloon angioplasty of LCx/OM2 in-stent restenosis with reduction in stenosis from 95% to 30%. Patent RCA stent. Mild to moderate stenosis of the mid LAD with improvement with intracoronary nitroglycerin, consistent with vasospasm. Recommended at least 12 months of DAPT, ideally longer. If recurrent angina, and worsening in-stent restenosis, single-vessel CABG, brachytherapy, or drug-coated balloon angioplasty (currently investigational only) may need to be considered. - Had Recurrent pain post intervention which improved with IV morphine and nitroglycerine.  - Ambulated well in morning with 1/10 burning chest sensation which improved with rest.   - Continue aspirin and brilinta - Continue imdur, amlodipine, and metoprolol - Continue pitavastatin - Added Ranexa 500mg  BID as outpatient   2. HTN - Continue amlodipine, metoprolol, and imdur - BP stable  3. HLD -LDL 110; intolerant to most statins but on pitavastatin -Consider PSK9 inhibitor outpatient   4. DM type  2 -A1C 6.2 09/07/19 -ISS while inpatient -Resumed metformin (hold 48 hours post PCI) at discharge  The patient been seen by Dr. Debara Pickett (primary cardiologist) today and deemed ready for discharge home. All follow-up appointments have been scheduled. Discharge medications are listed below.   Did the patient have an acute coronary syndrome (MI, NSTEMI, STEMI, etc) this admission?:  No                               Did the patient have a percutaneous coronary intervention (stent / angioplasty)?:  Yes.     Cath/PCI Registry Performance & Quality Measures: 1. Aspirin prescribed? - Yes 2. ADP Receptor Inhibitor (Plavix/Clopidogrel, Brilinta/Ticagrelor or Effient/Prasugrel) prescribed (includes medically managed patients)? - Yes 3. High Intensity Statin (Lipitor 40-80mg  or Crestor 20-40mg ) prescribed? - No - Consider PCSK9 as outpatient  4. For EF <40%, was ACEI/ARB prescribed? - Not Applicable (EF >/= [id number]) 5. For EF <40%, Aldosterone Antagonist (Spironolactone or Eplerenone) prescribed? - Not Applicable (EF >/= [id number]) 6. Cardiac Rehab Phase II ordered (Included Medically managed Patients)? - Yes    Discharge Vitals Blood pressure 118/85, pulse 63, temperature 97.9 F (36.6 C), temperature source Oral, resp. rate 20, height 5\' 8"  (1.727 m), weight 88.2 kg, SpO2 99 %.  Filed Weights   09/28/19 1800 09/29/19 0436 09/30/19 0428  Weight: 87.9 kg 87.1 kg 88.2 kg   Physical Exam  Constitutional: He is oriented to person, place, and time and well-developed, well-nourished, and in no distress.  HENT:  Head: Normocephalic  and atraumatic.  Eyes: Pupils are equal, round, and reactive to light. EOM are normal.  Cardiovascular: Normal rate and regular rhythm.  Left radial cath site without hematoma   Pulmonary/Chest: Effort normal and breath sounds normal.  Abdominal: Soft. Bowel sounds are normal.  Musculoskeletal:        General: Normal range of motion.     Cervical back: Normal range of motion and  neck supple.  Neurological: He is alert and oriented to person, place, and time.  Skin: Skin is warm and dry.  Psychiatric: Affect normal.   Labs & Radiologic Studies    CBC Recent Labs    09/28/19 1825 09/30/19 0414  WBC 6.4 6.9  HGB 13.5 12.2*  HCT 41.6 37.5*  MCV 87.8 88.9  PLT 233 [id number]   Basic Metabolic Panel Recent Labs    09/29/19 0315 09/30/19 0414  NA 141 136  K 4.0 4.2  CL 107 105  CO2 24 22  GLUCOSE 162* 263*  BUN 13 15  CREATININE 0.82 0.84  CALCIUM 9.0 8.8*   High Sensitivity Troponin:   Recent Labs  Lab 09/28/19 0823 09/28/19 0959  TROPONINIHS 8 8    _____________  DG Chest 2 View  Result Date: 09/28/2019 CLINICAL DATA:  Chest pain EXAM: CHEST - 2 VIEW COMPARISON:  March 26, 2019 FINDINGS: Lungs are clear. Heart size and pulmonary vascular normal. There is a left-sided coronary stent, unchanged. No adenopathy. No pneumothorax. No bone lesions. IMPRESSION: Lungs clear. Coronary stent present on the left. Cardiac silhouette stable and within normal limits. Electronically Signed   By: Lowella Grip III M.D.   On: 09/28/2019 08:45   CARDIAC CATHETERIZATION  Result Date: 09/29/2019 Conclusions: 1. Severe single-vessel coronary artery disease with diffuse in-stent restenosis of the LCx/OM2 stent with up to 95% narrowing.  There is also at least 80% stenosis involving the AV-groove LCx at the takeoff of OM2 with TIMI-2 flow at the start of the case and TIMI-1 flow following PTCA of the LCx/OM2 stents. 2. Mild to moderate stenosis of the mid LAD with improvement with intracoronary nitroglycerin, consistent with vasospasm. 3. Patent RCA stent. 4. Mildly elevated left ventricular filling pressure (LVEDP ~20 mmHg). 5. Challenging IVUS-guided balloon angioplasty of LCx/OM2 in-stent restenosis with reduction in stenosis from 95% to 30%.  Recommendations: 1. Aggressive medical therapy, including at least 12 months dual antiplatelet therapy (ideally longer). 2.  Titrate IV nitroglycerin for relief of chest pain, most likely due to TIMI-1 flow in jailed AV-groove LCx. 3. If patient has recurrent angina refractory to medical therapy and worsening in-stent restenosis, single-vessel CABG, brachytherapy, or drug-coated balloon angioplasty (currently investigational only) may need to be considered. 4. Aggressive secondary prevention. Nelva Bush, MD [facility]   ECHOCARDIOGRAM COMPLETE  Result Date: 09/29/2019    ECHOCARDIOGRAM REPORT   Patient Name:   [name] Date of Exam: 09/29/2019 Medical Rec #:  [id number]      Height:       68.0 in Accession #:    [id number]     Weight:       192.0 lb Date of Birth:  [birth year]       BSA:          2.009 m Patient Age:    60 years       BP:           105/79 mmHg Patient Gender: M              HR:  64 bpm. Exam Location:  Inpatient Procedure: 2D Echo Indications:    Chest Pain R07.9  History:        Patient has no prior history of Echocardiogram examinations.                 Previous Myocardial Infarction and CAD; Risk                 Factors:Hypertension, Diabetes and Dyslipidemia.  Sonographer:    Mikki Santee RDCS (AE) Referring Phys: [id number] [place]  1. Normal LV systolic function; grade 1 diastolic dysfunction; mild LVH.  2. Left ventricular ejection fraction, by estimation, is 55 to 60%. The left ventricle has normal function. The left ventricle has no regional wall motion abnormalities. There is mild left ventricular hypertrophy. Left ventricular diastolic parameters are consistent with Grade I diastolic dysfunction (impaired relaxation).  3. Right ventricular systolic function is normal. The right ventricular size is normal.  4. The mitral valve is normal in structure. Trivial mitral valve regurgitation. No evidence of mitral stenosis.  5. The aortic valve is tricuspid. Aortic valve regurgitation is not visualized. No aortic stenosis is present.  6. The inferior vena cava is normal in  size with greater than 50% respiratory variability, suggesting right atrial pressure of 3 mmHg. FINDINGS  Left Ventricle: Left ventricular ejection fraction, by estimation, is 55 to 60%. The left ventricle has normal function. The left ventricle has no regional wall motion abnormalities. The left ventricular internal cavity size was normal in size. There is  mild left ventricular hypertrophy. Left ventricular diastolic parameters are consistent with Grade I diastolic dysfunction (impaired relaxation). Right Ventricle: The right ventricular size is normal. Right ventricular systolic function is normal. Left Atrium: Left atrial size was normal in size. Right Atrium: Right atrial size was normal in size. Pericardium: There is no evidence of pericardial effusion. Mitral Valve: The mitral valve is normal in structure. Normal mobility of the mitral valve leaflets. Mild mitral annular calcification. Trivial mitral valve regurgitation. No evidence of mitral valve stenosis. Tricuspid Valve: The tricuspid valve is normal in structure. Tricuspid valve regurgitation is trivial. No evidence of tricuspid stenosis. Aortic Valve: The aortic valve is tricuspid. Aortic valve regurgitation is not visualized. No aortic stenosis is present. Pulmonic Valve: The pulmonic valve was normal in structure. Pulmonic valve regurgitation is trivial. No evidence of pulmonic stenosis. Aorta: The aortic root is normal in size and structure. Venous: The inferior vena cava is normal in size with greater than 50% respiratory variability, suggesting right atrial pressure of 3 mmHg. IAS/Shunts: No atrial level shunt detected by color flow Doppler. Additional Comments: Normal LV systolic function; grade 1 diastolic dysfunction; mild LVH.  LEFT VENTRICLE PLAX 2D LVIDd:         3.80 cm  Diastology LVIDs:         2.70 cm  LV e' lateral:   9.68 cm/s LV PW:         1.10 cm  LV E/e' lateral: 5.5 LV IVS:        1.20 cm  LV e' medial:    6.09 cm/s LVOT diam:      2.20 cm  LV E/e' medial:  8.7 LV SV:         76 LV SV Index:   38 LVOT Area:     3.80 cm  RIGHT VENTRICLE RV S prime:     12.20 cm/s TAPSE (M-mode): 2.3 cm LEFT ATRIUM  Index       RIGHT ATRIUM           Index LA diam:        3.50 cm 1.74 cm/m  RA Area:     13.30 cm LA Vol (A2C):   46.5 ml 23.15 ml/m RA Volume:   29.30 ml  14.59 ml/m LA Vol (A4C):   41.7 ml 20.76 ml/m LA Biplane Vol: 47.4 ml 23.60 ml/m  AORTIC VALVE LVOT Vmax:   89.40 cm/s LVOT Vmean:  60.700 cm/s LVOT VTI:    0.199 m  AORTA Ao Root diam: 3.60 cm MITRAL VALVE MV Area (PHT): 3.17 cm    SHUNTS MV Decel Time: 239 msec    Systemic VTI:  0.20 m MV E velocity: 53.10 cm/s  Systemic Diam: 2.20 cm MV A velocity: 61.70 cm/s MV E/A ratio:  0.86 Kirk Ruths MD Electronically signed by Kirk Ruths MD Signature Date/Time: 09/29/2019/9:37:03 AM    Final    Disposition   Pt is being discharged home today in good condition.  Follow-up Plans & Appointments    Follow-up Information    [facility] Follow up.   Specialty: Professional Counselor Why: Walk in [place]- Friday   Contact information: [facility] [address] [phone]        Pixie Casino, MD Follow up.   Specialty: Cardiology Why: office will call with time and date of appointment Contact information: [address] [place] [address] [place] [address] [phone]          Discharge Instructions    AMB Referral to Cardiac Rehabilitation - Phase II   Complete by: As directed    Diagnosis: PTCA   After initial evaluation and assessments completed: Virtual Based Care may be provided alone or in conjunction with Phase 2 Cardiac Rehab based on patient barriers.: Yes   Amb Referral to Cardiac Rehabilitation   Complete by: As directed    Diagnosis: PTCA   After initial evaluation and assessments completed: Virtual Based Care may be provided alone or in  conjunction with Phase 2 Cardiac Rehab based on patient barriers.: Yes   Diet - low sodium heart healthy   Complete by: As directed    Discharge instructions   Complete by: As directed    No driving for 48 hours. No lifting over 5 lbs for 1 week. No sexual activity for 1 week. You may return to work on 10/03/19. Keep procedure site clean & dry. If you notice increased pain, swelling, bleeding or pus, call/return!  You may shower, but no soaking baths/hot tubs/pools for 1 week. Hold metformin over weekend, resume Monday 10/02/19.   Increase activity slowly   Complete by: As directed       Discharge Medications   Allergies as of 09/30/2019      Reactions   Bee Venom Anaphylaxis, Hives   Shellfish Allergy Anaphylaxis, Hives   Statins Other (See Comments)   Myalgias. Tolerating livalo.   Testosterone Cypionate    Testerone Injection --Increased breast tissue       Medication List    TAKE these medications   amLODipine 10 MG tablet Commonly known as: NORVASC Take 1 tablet (10 mg total) by mouth daily.   ARIPiprazole 2 MG tablet Commonly known as: ABILIFY Take 1 tablet (2 mg total) by mouth daily.   aspirin 81 MG chewable tablet Chew 1 tablet (81 mg total) by mouth daily.  co-enzyme Q-10 30 MG capsule Take 30 mg by mouth daily.   famotidine 20 MG tablet Commonly known as: PEPCID Take 1 tablet (20 mg total) by mouth 2 (two) times daily.   isosorbide mononitrate 60 MG 24 hr tablet Commonly known as: IMDUR Take 1.5 tablets (90 mg total) by mouth daily.   Livalo 4 MG Tabs Generic drug: Pitavastatin Calcium TAKE 1 TABLET BY MOUTH ONCE DAILY IN THE MORNING What changed: See the new instructions.   metFORMIN 500 MG tablet Commonly known as: GLUCOPHAGE TAKE 1 TABLET BY MOUTH TWICE DAILY WITH A MEAL What changed: See the new instructions.   metoprolol tartrate 100 MG tablet Commonly known as: LOPRESSOR Take 1 tablet by mouth twice daily   mirtazapine 15 MG tablet Commonly  known as: REMERON Take 1 tablet (15 mg total) by mouth at bedtime.   nitroGLYCERIN 0.4 MG SL tablet Commonly known as: NITROSTAT Place 1 tablet (0.4 mg total) under the tongue every 5 (five) minutes x 3 doses as needed for chest pain.   ranolazine 500 MG 12 hr tablet Commonly known as: RANEXA Take 1 tablet (500 mg total) by mouth 2 (two) times daily.   ticagrelor 90 MG Tabs tablet Commonly known as: BRILINTA Take 1 tablet (90 mg total) by mouth 2 (two) times daily.   vortioxetine HBr 10 MG Tabs tablet Commonly known as: Trintellix Take 1 tablet (10 mg total) by mouth daily.          Outstanding Labs/Studies   None  Duration of Discharge Encounter   Greater than 30 minutes including physician time.  Jarrett Soho, PA 09/30/2019, 10:04 AM

## 2019-09-30 NOTE — Progress Notes (Signed)
CARDIAC REHAB PHASE I   PRE:  Rate/Rhythm: 84 SR  BP:  Supine: 131/84  Sitting:   Standing:    SaO2:   MODE:  Ambulation: 1600 ft   POST:  Rate/Rhythm: 99 SR  BP:  Supine:   Sitting: 146/95  Standing:    SaO2:   0850-0950 Pt ambulated 1600 feet. By end of walk he c/o of feeling some chest burning. He states that it is a 1 on the pain scale. Pt sat on the side of bed after walk and pain resolved. BP after walk 146/95. Completed PTCA education with pt. We discussed diet, exercise, angina, proper use of sl NTG, calling 911, Brilinta,and Outpt. CRP. He voices understanding. Will send referral to [place] [facility].  Rodney Langton RN 09/30/2019 9:46 AM

## 2019-10-02 MED FILL — Fentanyl Citrate Preservative Free (PF) Inj 100 MCG/2ML: INTRAMUSCULAR | Qty: 2 | Status: AC

## 2019-10-03 ENCOUNTER — Telehealth (HOSPITAL_COMMUNITY): Payer: Self-pay

## 2019-10-03 ENCOUNTER — Ambulatory Visit (INDEPENDENT_AMBULATORY_CARE_PROVIDER_SITE_OTHER): Payer: Medicaid Other | Admitting: Family Medicine

## 2019-10-03 ENCOUNTER — Encounter: Payer: Self-pay | Admitting: Family Medicine

## 2019-10-03 ENCOUNTER — Other Ambulatory Visit: Payer: Self-pay

## 2019-10-03 VITALS — BP 110/73 | HR 70 | Temp 97.8°F | Ht 68.0 in | Wt 194.4 lb

## 2019-10-03 DIAGNOSIS — R7309 Other abnormal glucose: Secondary | ICD-10-CM

## 2019-10-03 DIAGNOSIS — Z09 Encounter for follow-up examination after completed treatment for conditions other than malignant neoplasm: Secondary | ICD-10-CM

## 2019-10-03 DIAGNOSIS — Z9889 Other specified postprocedural states: Secondary | ICD-10-CM

## 2019-10-03 DIAGNOSIS — E1159 Type 2 diabetes mellitus with other circulatory complications: Secondary | ICD-10-CM

## 2019-10-03 DIAGNOSIS — I10 Essential (primary) hypertension: Secondary | ICD-10-CM | POA: Diagnosis not present

## 2019-10-03 DIAGNOSIS — R739 Hyperglycemia, unspecified: Secondary | ICD-10-CM

## 2019-10-03 LAB — POCT URINALYSIS DIPSTICK
Bilirubin, UA: NEGATIVE
Blood, UA: NEGATIVE
Glucose, UA: NEGATIVE
Ketones, UA: NEGATIVE
Leukocytes, UA: NEGATIVE
Nitrite, UA: NEGATIVE
Protein, UA: NEGATIVE
Spec Grav, UA: <=1.005 — AB (ref 1.010–1.025)
Urobilinogen, UA: 0.2 E.U./dL
pH, UA: 6 (ref 5.0–8.0)

## 2019-10-03 LAB — GLUCOSE, POCT (MANUAL RESULT ENTRY): POC Glucose: 206 mg/dl — AB (ref 70–99)

## 2019-10-03 NOTE — Telephone Encounter (Signed)
Pt insurance is active through [place]. Ref# [phone]  Will fax over Medicaid Reimbursement form to Dr. Debara Pickett  Will contact pt to see if he is interested in the Cardiac Rehab program. If interested, pt will need to complete f/u appt on. Once completed, pt will be contacted for scheduling upon review by the RN Navigator.

## 2019-10-03 NOTE — Progress Notes (Signed)
Patient [name] Follow Up  Subjective:  Patient ID: [name], male    DOB: May 21, 1961  Age: 59 y.o. MRN: [id number]  CC:  Chief Complaint  Patient presents with  . Follow-up    HPI KIA GUSH is a 59 year old male who presents for Follow Up today.   Past Medical History:  Diagnosis Date  . Allergies   . Arthritis   . Chest pain   . Chronic lower back pain   . Chronic pain of right wrist   . Coronary artery disease    a. Multiple prior caths/PCI. Cath 2013 with possible spasm of RCA, 70% ISR of mid LCx with subsequent DES to mLCx and prox LCX. b. H/o microvascular angina. c. Recurrent angina 08/2014 - s/p PTCA/DES to prox Cx, PTCA/CBA to OM1.  c. LHC 06/10/15 with patent stents and some ISR in LCX and OM-1 that was not flow limiting --> Rx   . Dyslipidemia    a. Intolerant to many statins except tolerating Livalo.  Marland Kitchen GERD (gastroesophageal reflux disease)   . H/O cardiac catheterization 10/25/2018  . Hypertension   . Myocardial infarction ([place]) ~ 2010  . S/P angioplasty with stent, DES, to proximal and mid LCX 12/15/11 12/15/2011  . Shoulder pain   . Stroke [facility])    pt. reports had a stroke around time of MI 2010  . Type II diabetes mellitus ([place])   . Unstable angina ([name])     Current Status: Since his last office visit, he has had a Hospital stay for Unstable Angina, which he had Percutaneous Coronary Angioplasty. Today, he is doing well with no complaints. He denies visual changes, chest pain, cough, shortness of breath, heart palpitations, and falls. He has occasional headaches and dizziness with position changes. Denies severe headaches, confusion, seizures, double vision, and blurred vision, nausea and vomiting. He denies fatigue, frequent urination, blurred vision, excessive hunger, excessive thirst, weight gain, weight loss, and poor wound healing. He continues to check his feet regularly. He denies fevers,  chills, recent infections, weight loss, and night sweats. No reports of GI problems such as nausea, vomiting, diarrhea, and constipation. He has no reports of blood in stools, dysuria and hematuria. His anxiety is stable today. He denies suicidal ideations, homicidal ideations, or auditory hallucinations. He is taking all medications as prescribed. He denies pain today.   Past Surgical History:  Procedure Laterality Date  . CARDIAC CATHETERIZATION  06/15/2002   LAD with prox 40% stenosis, norma L main, Cfx with 25% lesion, RCA with long mid 25% stenosis (Dr. Vita Barley)  . CARDIAC CATHETERIZATION  04/01/2010   normal L main, LAD wit mild stenosis, L Cfx with 70% in-stent restenosis, RCA with 70% in-stent restenosis, LVEF >60% (Dr. K. Mali Hilty) - cutting ballon arthrectomy to RCA & Cfx (Dr. Rockne Menghini)  . CARDIAC CATHETERIZATION  08/25/2010   preserved global LV contractility; multivessel CAD, diffuse 90-95% in-stent restenosis in prox placed Cfx stent - cutting balloon arthrectomy in Cfx with multiple dilatations 90-95% to 0% stenosis (Dr. Corky Downs)  . CARDIAC CATHETERIZATION  01/26/2011   PCI & stenting of aggresive in-stent restenosis within previously stented AV groove Cfx with 3.0x54mm Taxus DES (previous stents were Promus) (Dr. Adora Fridge)  . CARDIAC CATHETERIZATION  05/11/2011   preserved LV function, 40% mid LAD stenosis, 30-40% narrowing proximal to stented semgnet of prox Cfx, patent mid RCA stent with smooth 20% narrowing  in distal RCA (Dr. Corky Downs)  . CARDIAC CATHETERIZATION  12/15/2011   PCI & stenting of proximal & mid Cfx with DES - 3.0x6mm in proximal, 3.0x77mm in mid (Dr. Adora Fridge)  . CARDIAC CATHETERIZATION N/A 06/10/2015   Procedure: Left Heart Cath and Coronary Angiography;  Surgeon: Jolaine Artist, MD;  Location: [place] [facility];  Service: Cardiovascular;  Laterality: N/A;  . CARDIAC CATHETERIZATION  10/25/2018  . cardiometabolic testing  123456   good exercise  effort, peak VO2 79% predicted with normal VO2 HR curves (mild deconditioning)  . COLONOSCOPY  12/2012   diminutive hyperplastic sigmoid poyp so repeat routine 2024  . CORONARY BALLOON ANGIOPLASTY N/A 10/25/2018   Procedure: CORONARY BALLOON ANGIOPLASTY;  Surgeon: Jettie Booze, MD;  Location: [place] [facility];  Service: Cardiovascular;  Laterality: N/A;  . CORONARY BALLOON ANGIOPLASTY N/A 09/29/2019   Procedure: CORONARY BALLOON ANGIOPLASTY;  Surgeon: Nelva Bush, MD;  Location: [place] [facility];  Service: Cardiovascular;  Laterality: N/A;  . EXCISIONAL HEMORRHOIDECTOMY  1984  . INTRAVASCULAR ULTRASOUND/IVUS N/A 09/29/2019   Procedure: Intravascular Ultrasound/IVUS;  Surgeon: Nelva Bush, MD;  Location: [place] [facility];  Service: Cardiovascular;  Laterality: N/A;  . LEFT HEART CATH AND CORONARY ANGIOGRAPHY N/A 10/25/2018   Procedure: LEFT HEART CATH AND CORONARY ANGIOGRAPHY;  Surgeon: Jettie Booze, MD;  Location: [place] [facility];  Service: Cardiovascular;  Laterality: N/A;  . LEFT HEART CATH AND CORONARY ANGIOGRAPHY N/A 09/29/2019   Procedure: LEFT HEART CATH AND CORONARY ANGIOGRAPHY;  Surgeon: Nelva Bush, MD;  Location: [place] [facility];  Service: Cardiovascular;  Laterality: N/A;  . LEFT HEART CATHETERIZATION WITH CORONARY ANGIOGRAM N/A 05/11/2011   Procedure: LEFT HEART CATHETERIZATION WITH CORONARY ANGIOGRAM;  Surgeon: Troy Sine, MD;  Location: [facility] CATH LAB;  Service: Cardiovascular;  Laterality: N/A;  Possible percutaneous coronary intervention, possible IVUS  . LEFT HEART CATHETERIZATION WITH CORONARY ANGIOGRAM N/A 12/15/2011   Procedure: LEFT HEART CATHETERIZATION WITH CORONARY ANGIOGRAM;  Surgeon: Lorretta Harp, MD;  Location: [facility] CATH LAB;  Service: Cardiovascular;  Laterality: N/A;  . LEFT HEART CATHETERIZATION WITH CORONARY ANGIOGRAM N/A 09/05/2014   Procedure: LEFT HEART CATHETERIZATION WITH CORONARY ANGIOGRAM;  Surgeon: Burnell Blanks,  MD;  Location: [facility] CATH LAB;  Service: Cardiovascular;  Laterality: N/A;  . LIPOMA EXCISION     back of the head  . NM MYOCAR PERF WALL MOTION  02/2012   lexiscan myoview; mild perfusion defect in mid inferolateral & basal inferolateral region (infarct/scar); EF 52%, abnormal but ow risk scan  . PERCUTANEOUS CORONARY STENT INTERVENTION (PCI-S)  09/05/2014   Procedure: PERCUTANEOUS CORONARY STENT INTERVENTION (PCI-S);  Surgeon: Burnell Blanks, MD;  Location: [facility] CATH LAB;  Service: Cardiovascular;;    Family History  Problem Relation Age of Onset  . Leukemia Mother   . Prostate cancer Father   . Coronary artery disease Paternal Grandmother   . Cancer Paternal Grandfather   . Cancer Brother     Social History   Socioeconomic History  . Marital status: Divorced    Spouse name: Not on file  . Number of children: 2  . Years of education: GED  . Highest education level: Not on file  Occupational History  . Not on file  Tobacco Use  . Smoking status: Former Smoker    Packs/day: 1.00    Years: 10.00    Pack years: 10.00    Types: Cigarettes    Quit date: 10/07/2018    Years  since quitting: 0.9  . Smokeless tobacco: Never Used  Substance and Sexual Activity  . Alcohol use: Yes  . Drug use: No    Comment: 12/15/11 "last cocaine was 2010"  . Sexual activity: Yes  Other Topics Concern  . Not on file  Social History Narrative  . Not on file   Social Determinants of Health   Financial Resource Strain:   . Difficulty of Paying Living Expenses:   Food Insecurity:   . Worried About Charity fundraiser in the Last Year:   . Arboriculturist in the Last Year:   Transportation Needs:   . Film/video editor (Medical):   Marland Kitchen Lack of Transportation (Non-Medical):   Physical Activity:   . Days of Exercise per Week:   . Minutes of Exercise per Session:   Stress:   . Feeling of Stress :   Social Connections:   . Frequency of Communication with Friends and Family:   . Frequency  of Social Gatherings with Friends and Family:   . Attends Religious Services:   . Active Member of Clubs or Organizations:   . Attends Archivist Meetings:   Marland Kitchen Marital Status:   Intimate Partner Violence:   . Fear of Current or Ex-Partner:   . Emotionally Abused:   Marland Kitchen Physically Abused:   . Sexually Abused:     Outpatient Medications Prior to Visit  Medication Sig Dispense Refill  . amLODipine (NORVASC) 10 MG tablet Take 1 tablet (10 mg total) by mouth daily. 90 tablet 1  . ARIPiprazole (ABILIFY) 2 MG tablet Take 1 tablet (2 mg total) by mouth daily. 90 tablet 1  . aspirin 81 MG chewable tablet Chew 1 tablet (81 mg total) by mouth daily. 30 tablet 3  . co-enzyme Q-10 30 MG capsule Take 30 mg by mouth daily.    . isosorbide mononitrate (IMDUR) 60 MG 24 hr tablet Take 1.5 tablets (90 mg total) by mouth daily. 135 tablet 3  . LIVALO 4 MG TABS TAKE 1 TABLET BY MOUTH ONCE DAILY IN THE MORNING (Patient taking differently: Take 1 tablet by mouth every morning. ) 30 tablet 3  . metFORMIN (GLUCOPHAGE) 500 MG tablet TAKE 1 TABLET BY MOUTH TWICE DAILY WITH A MEAL (Patient taking differently: Take 500 mg by mouth 2 (two) times daily with a meal. ) 180 tablet 0  . metoprolol tartrate (LOPRESSOR) 100 MG tablet Take 1 tablet by mouth twice daily (Patient taking differently: Take 100 mg by mouth 2 (two) times daily. ) 60 tablet 0  . mirtazapine (REMERON) 15 MG tablet Take 1 tablet (15 mg total) by mouth at bedtime. 90 tablet 1  . ranolazine (RANEXA) 500 MG 12 hr tablet Take 1 tablet (500 mg total) by mouth 2 (two) times daily. 60 tablet 6  . ticagrelor (BRILINTA) 90 MG TABS tablet Take 1 tablet (90 mg total) by mouth 2 (two) times daily. 180 tablet 1  . famotidine (PEPCID) 20 MG tablet Take 1 tablet (20 mg total) by mouth 2 (two) times daily. (Patient not taking: Reported on 10/03/2019) 60 tablet 2  . nitroGLYCERIN (NITROSTAT) 0.4 MG SL tablet Place 1 tablet (0.4 mg total) under the tongue every 5  (five) minutes x 3 doses as needed for chest pain. (Patient not taking: Reported on 10/03/2019) 25 tablet 12  . vortioxetine HBr (TRINTELLIX) 10 MG TABS tablet Take 1 tablet (10 mg total) by mouth daily. (Patient not taking: Reported on 09/28/2019) 90 tablet 1  No facility-administered medications prior to visit.    Allergies  Allergen Reactions  . Bee Venom Anaphylaxis and Hives  . Shellfish Allergy Anaphylaxis and Hives  . Statins Other (See Comments)    Myalgias. Tolerating livalo.   . Testosterone Cypionate     Testerone Injection --Increased breast tissue     ROS Review of Systems  Constitutional: Negative.   HENT: Negative.   Eyes: Negative.   Respiratory: Negative.   Cardiovascular: Negative.   Endocrine: Negative.   Genitourinary: Negative.   Allergic/Immunologic: Negative.   Neurological: Positive for dizziness (occasional ) and headaches (occasional ).  Hematological: Negative.   Psychiatric/Behavioral: Negative.       Objective:    Physical Exam  BP 110/73   Pulse 70   Temp 97.8 F (36.6 C) (Oral)   Ht 5\' 8"  (1.727 m)   Wt 194 lb 6.4 oz (88.2 kg)   SpO2 100%   BMI 29.56 kg/m  Wt Readings from Last 3 Encounters:  10/03/19 194 lb 6.4 oz (88.2 kg)  09/30/19 194 lb 7.1 oz (88.2 kg)  08/01/19 182 lb 8 oz (82.8 kg)     Health Maintenance Due  Topic Date Due  . URINE MICROALBUMIN  Never done  . FOOT EXAM  03/03/2019    There are no preventive care reminders to display for this patient.  Lab Results  Component Value Date   TSH 1.286 09/08/2019   Lab Results  Component Value Date   WBC 6.9 09/30/2019   HGB 12.2 (L) 09/30/2019   HCT 37.5 (L) 09/30/2019   MCV 88.9 09/30/2019   PLT 216 09/30/2019   Lab Results  Component Value Date   NA 136 09/30/2019   K 4.2 09/30/2019   CO2 22 09/30/2019   GLUCOSE 263 (H) 09/30/2019   BUN 15 09/30/2019   CREATININE 0.84 09/30/2019   BILITOT 0.5 09/07/2019   ALKPHOS 42 09/07/2019   AST 21 09/07/2019    ALT 28 09/07/2019   PROT 6.6 09/07/2019   ALBUMIN 4.3 09/07/2019   CALCIUM 8.8 (L) 09/30/2019   ANIONGAP 9 09/30/2019   Lab Results  Component Value Date   CHOL 183 09/08/2019   Lab Results  Component Value Date   HDL 52 09/08/2019   Lab Results  Component Value Date   LDLCALC 110 (H) 09/08/2019   Lab Results  Component Value Date   TRIG 105 09/08/2019   Lab Results  Component Value Date   CHOLHDL 3.5 09/08/2019   Lab Results  Component Value Date   HGBA1C 6.2 (H) 09/07/2019      Assessment & Plan:   1. Hospital discharge follow-up  2. S/p percutaneous right heart catheterization He is doing well. No signs or symptoms of chest discomfort noted or reported today.   3. Hemoglobin A1c less than 7.0% Hgb A1c stable on 09/07/2019 at 6.2. Monitor.   4. Type 2 diabetes mellitus with other circulatory complication, without long-term current use of insulin ([place]) He will continue medication as prescribed, to decrease foods/beverages high in sugars and carbs and follow Heart Healthy or DASH diet. Increase physical activity to at least 30 minutes cardio exercise daily.   5. Hyperglycemia - POCT glucose (manual entry)  6. Essential hypertension The current medical regimen is effective; blood pressure is stable at 110/73 today; continue present plan and medications as prescribed. He will continue to take medications as prescribed, to decrease high sodium intake, excessive alcohol intake, increase potassium intake, smoking cessation, and increase physical activity  of at least 30 minutes of cardio activity daily. He will continue to follow Heart Healthy or DASH diet. - POCT urinalysis dipstick  7. Follow up He will follow up in 6 months.   No orders of the defined types were placed in this encounter.   Orders Placed This Encounter  Procedures  . POCT urinalysis dipstick  . POCT glucose (manual entry)    Referral Orders  No referral(s) requested today   Kathe Becton,  MSN, FNP-BC [address] [place], [place] [address] [phone] fax  Problem List Items Addressed This Visit      Cardiovascular and Mediastinum   Essential hypertension   Relevant Orders   POCT urinalysis dipstick (Completed)     Endocrine   DM2 (diabetes mellitus, type 2) ([name]) (Chronic)     Other   Hemoglobin A1c less than 7.0%    Other Visit Diagnoses    Hospital discharge follow-up    -  Primary   S/p percutaneous right heart catheterization       Hyperglycemia       Relevant Orders   POCT glucose (manual entry) (Completed)   Follow up          No orders of the defined types were placed in this encounter.   Follow-up: Return in about 6 months (around 04/03/2020).    Azzie Glatter, FNP

## 2019-10-03 NOTE — Telephone Encounter (Signed)
Referral recv'd and verified for MD signature. Pt has not scheduled his follow up as of yet. Insurance benefits and eligibility TBD.  Attempted to contact pt to inform him if he would like to participate in CR he would need to schedule and complete his f/u appt. LMTCB

## 2019-10-04 ENCOUNTER — Other Ambulatory Visit: Payer: Self-pay | Admitting: Family Medicine

## 2019-10-04 DIAGNOSIS — E1159 Type 2 diabetes mellitus with other circulatory complications: Secondary | ICD-10-CM

## 2019-10-04 DIAGNOSIS — I10 Essential (primary) hypertension: Secondary | ICD-10-CM

## 2019-10-04 MED ORDER — ARIPIPRAZOLE 2 MG PO TABS: 2.0000 mg | ORAL_TABLET | Freq: Every day | ORAL | 2 refills | Status: DC

## 2019-10-04 MED ORDER — TICAGRELOR 90 MG PO TABS: 90.0000 mg | ORAL_TABLET | Freq: Two times a day (BID) | ORAL | 2 refills | Status: DC

## 2019-10-04 MED ORDER — MIRTAZAPINE 15 MG PO TABS: 15.0000 mg | ORAL_TABLET | Freq: Every day | ORAL | 2 refills | Status: DC

## 2019-10-04 MED ORDER — ASPIRIN 81 MG PO CHEW: 81.0000 mg | CHEWABLE_TABLET | Freq: Every day | ORAL | 2 refills | Status: DC

## 2019-10-04 MED ORDER — METOPROLOL TARTRATE 100 MG PO TABS: 100.0000 mg | ORAL_TABLET | Freq: Two times a day (BID) | ORAL | 2 refills | Status: DC

## 2019-10-04 MED ORDER — ISOSORBIDE MONONITRATE ER 60 MG PO TB24: 90.0000 mg | ORAL_TABLET | Freq: Every day | ORAL | 2 refills | Status: DC

## 2019-10-04 MED ORDER — AMLODIPINE BESYLATE 10 MG PO TABS: 10.0000 mg | ORAL_TABLET | Freq: Every day | ORAL | 2 refills | Status: DC

## 2019-10-04 MED ORDER — RANOLAZINE ER 500 MG PO TB12: 500.0000 mg | ORAL_TABLET | Freq: Two times a day (BID) | ORAL | 2 refills | Status: DC

## 2019-10-04 MED ORDER — FAMOTIDINE 20 MG PO TABS: 20.0000 mg | ORAL_TABLET | Freq: Two times a day (BID) | ORAL | 2 refills | Status: DC

## 2019-10-04 MED ORDER — METFORMIN HCL 500 MG PO TABS: ORAL_TABLET | ORAL | 2 refills | Status: DC

## 2019-10-04 MED ORDER — LIVALO 4 MG PO TABS: ORAL_TABLET | ORAL | 2 refills | Status: DC

## 2019-10-05 MED ORDER — ATORVASTATIN CALCIUM 10 MG PO TABS
20.00 | ORAL_TABLET | ORAL | Status: DC
Start: 2019-10-06 — End: 2019-10-05

## 2019-10-05 MED ORDER — QUINTABS PO TABS
1.00 | ORAL_TABLET | ORAL | Status: DC
Start: 2019-10-07 — End: 2019-10-05

## 2019-10-05 MED ORDER — BENZOCAINE-MENTHOL 6-10 MG MT LOZG
1.00 | LOZENGE | OROMUCOSAL | Status: DC
Start: ? — End: 2019-10-05

## 2019-10-05 MED ORDER — ACETAMINOPHEN 325 MG PO TABS
650.00 | ORAL_TABLET | ORAL | Status: DC
Start: ? — End: 2019-10-05

## 2019-10-05 MED ORDER — NICOTINE POLACRILEX 2 MG MT GUM
2.00 | CHEWING_GUM | OROMUCOSAL | Status: DC
Start: ? — End: 2019-10-05

## 2019-10-05 MED ORDER — ISOSORBIDE MONONITRATE ER 60 MG PO TB24
60.00 | ORAL_TABLET | ORAL | Status: DC
Start: 2019-10-07 — End: 2019-10-05

## 2019-10-05 MED ORDER — SALINE NASAL SPRAY 0.65 % NA SOLN
1.00 | NASAL | Status: DC
Start: ? — End: 2019-10-05

## 2019-10-05 MED ORDER — TRAZODONE HCL 50 MG PO TABS
50.00 | ORAL_TABLET | ORAL | Status: DC
Start: ? — End: 2019-10-05

## 2019-10-05 MED ORDER — TICAGRELOR 90 MG PO TABS
90.00 | ORAL_TABLET | ORAL | Status: DC
Start: 2019-10-06 — End: 2019-10-05

## 2019-10-05 MED ORDER — METFORMIN HCL ER 500 MG PO TB24
500.00 | ORAL_TABLET | ORAL | Status: DC
Start: 2019-10-06 — End: 2019-10-05

## 2019-10-05 MED ORDER — HYDROXYZINE HCL 25 MG PO TABS
50.00 | ORAL_TABLET | ORAL | Status: DC
Start: ? — End: 2019-10-05

## 2019-10-05 MED ORDER — GENERIC EXTERNAL MEDICATION
5.00 | Status: DC
Start: ? — End: 2019-10-05

## 2019-10-05 MED ORDER — ONDANSETRON 8 MG PO TBDP
8.00 | ORAL_TABLET | ORAL | Status: DC
Start: ? — End: 2019-10-05

## 2019-10-05 MED ORDER — BISACODYL 5 MG PO TBEC
10.00 | DELAYED_RELEASE_TABLET | ORAL | Status: DC
Start: ? — End: 2019-10-05

## 2019-10-05 MED ORDER — DIPHENHYDRAMINE HCL 50 MG/ML IJ SOLN
50.00 | INTRAMUSCULAR | Status: DC
Start: ? — End: 2019-10-05

## 2019-10-05 MED ORDER — NITROGLYCERIN 0.4 MG SL SUBL
0.40 | SUBLINGUAL_TABLET | SUBLINGUAL | Status: DC
Start: ? — End: 2019-10-05

## 2019-10-05 MED ORDER — HALOPERIDOL 5 MG PO TABS
5.00 | ORAL_TABLET | ORAL | Status: DC
Start: ? — End: 2019-10-05

## 2019-10-05 MED ORDER — DIPHENHYDRAMINE HCL 25 MG PO CAPS
50.00 | ORAL_CAPSULE | ORAL | Status: DC
Start: ? — End: 2019-10-05

## 2019-10-05 MED ORDER — CLONIDINE HCL 0.1 MG PO TABS
0.10 | ORAL_TABLET | ORAL | Status: DC
Start: ? — End: 2019-10-05

## 2019-10-05 MED ORDER — GUAIFENESIN 100 MG/5ML PO SYRP
200.00 | ORAL_SOLUTION | ORAL | Status: DC
Start: ? — End: 2019-10-05

## 2019-10-05 MED ORDER — LORAZEPAM 2 MG/ML IJ SOLN
2.00 | INTRAMUSCULAR | Status: DC
Start: ? — End: 2019-10-05

## 2019-10-05 MED ORDER — ASPIRIN 81 MG PO TBEC
81.00 | DELAYED_RELEASE_TABLET | ORAL | Status: DC
Start: 2019-10-07 — End: 2019-10-05

## 2019-10-05 MED ORDER — AMLODIPINE BESYLATE 5 MG PO TABS
10.00 | ORAL_TABLET | ORAL | Status: DC
Start: 2019-10-07 — End: 2019-10-05

## 2019-10-05 MED ORDER — ARIPIPRAZOLE 2 MG PO TABS
2.00 | ORAL_TABLET | ORAL | Status: DC
Start: 2019-10-07 — End: 2019-10-05

## 2019-10-05 MED ORDER — MIRTAZAPINE 15 MG PO TABS
15.00 | ORAL_TABLET | ORAL | Status: DC
Start: 2019-10-06 — End: 2019-10-05

## 2019-10-05 MED ORDER — CETIRIZINE HCL 10 MG PO TABS
10.00 | ORAL_TABLET | ORAL | Status: DC
Start: ? — End: 2019-10-05

## 2019-10-05 MED ORDER — METOPROLOL SUCCINATE ER 50 MG PO TB24
100.00 | ORAL_TABLET | ORAL | Status: DC
Start: 2019-10-07 — End: 2019-10-05

## 2019-10-05 MED ORDER — LOPERAMIDE HCL 2 MG PO CAPS
2.00 | ORAL_CAPSULE | ORAL | Status: DC
Start: ? — End: 2019-10-05

## 2019-10-05 MED ORDER — ALUM & MAG HYDROXIDE-SIMETH 200-200-20 MG/5ML PO SUSP
30.00 | ORAL | Status: DC
Start: ? — End: 2019-10-05

## 2019-10-09 ENCOUNTER — Telehealth (HOSPITAL_COMMUNITY): Payer: Self-pay

## 2019-10-09 NOTE — Telephone Encounter (Signed)
Pt called about his referral, adv pt he has to schedule and complete his f/u appt before scheduling for CR. Patient verbalized understanding.

## 2019-10-18 ENCOUNTER — Ambulatory Visit: Payer: Medicaid Other | Admitting: Internal Medicine

## 2019-11-28 ENCOUNTER — Telehealth: Payer: Self-pay | Admitting: Internal Medicine

## 2019-11-28 ENCOUNTER — Telehealth (HOSPITAL_COMMUNITY): Payer: Self-pay | Admitting: *Deleted

## 2019-11-28 DIAGNOSIS — Z9861 Coronary angioplasty status: Secondary | ICD-10-CM

## 2019-11-28 NOTE — Telephone Encounter (Signed)
Received second referral for this pt to participate in cardiac rehab.  Per telephone note with cardiology office a new referral was needed due to long wait because of covid.  This is inaccurate. Pt must complete follow up appt with cardiology before they can participate in cardiac rehab.  Pt cancelled his appt on 4/21.  See that he has an appt scheduled for 8/10.  Sent note to Dr. Debara Pickett nurse to see if an appt sooner is possible.  Called and left message for pt to please call back and explain our scheduling process.  Contact information provided. Cherre Huger, BSN Cardiac and Training and development officer

## 2019-11-28 NOTE — Telephone Encounter (Signed)
[name] is calling requesting a new referral be sent to the [facility] due to them stating he'd need a new one to be scheduled due to waiting so long to schedule because of covid. Please advise.

## 2019-11-28 NOTE — Telephone Encounter (Signed)
New message  Called patient, no answer left message to call back and schedule appt.    Staff MSG: This patient needs a hospital follow up (PTCA/angina - April) so he can be evaluated for cardiac rehab. He has an OV with Isaac Laud in August but needs to be seen sooner.

## 2019-11-28 NOTE — Telephone Encounter (Signed)
Cardiac rehab referral ordered and Barnet Pall RN notified via staff message Patient has f/u with Almyra Deforest PA in August 2021

## 2019-11-29 ENCOUNTER — Telehealth (HOSPITAL_COMMUNITY): Payer: Self-pay | Admitting: *Deleted

## 2019-11-29 NOTE — Telephone Encounter (Signed)
Received call back from [place].  Advised pt that he must complete his follow up appt with cardiology in order to schedule for cardiac rehab.  Told pt that I had contacted his cardiologist office to see if he could get an earlier appt than august.  Pt informed me that he had just received a call and was able to get an earlier appt for July 1st.  Pt aware that he will be contacted after completion of that appt.for scheduling.  Encouraged pt to continue with exercise - walking 30 minutes 2 day as tolerated. Cherre Huger, BSN Cardiac and Training and development officer

## 2019-12-09 ENCOUNTER — Ambulatory Visit (HOSPITAL_COMMUNITY)
Admission: RE | Admit: 2019-12-09 | Discharge: 2019-12-09 | Disposition: A | Discharge status: Home / Self Care | Payer: Medicaid Other | Attending: Psychiatry | Admitting: Psychiatry

## 2019-12-09 ENCOUNTER — Other Ambulatory Visit: Payer: Self-pay

## 2019-12-09 ENCOUNTER — Encounter (HOSPITAL_COMMUNITY): Payer: Self-pay | Admitting: *Deleted

## 2019-12-09 ENCOUNTER — Emergency Department (HOSPITAL_COMMUNITY)
Admission: EM | Admit: 2019-12-09 | Discharge: 2019-12-10 | Disposition: A | Discharge status: Home / Self Care | Payer: Medicaid Other | Attending: Emergency Medicine | Admitting: Emergency Medicine

## 2019-12-09 DIAGNOSIS — I252 Old myocardial infarction: Secondary | ICD-10-CM | POA: Diagnosis not present

## 2019-12-09 DIAGNOSIS — I119 Hypertensive heart disease without heart failure: Secondary | ICD-10-CM | POA: Diagnosis not present

## 2019-12-09 DIAGNOSIS — Z79899 Other long term (current) drug therapy: Secondary | ICD-10-CM | POA: Insufficient documentation

## 2019-12-09 DIAGNOSIS — Z7982 Long term (current) use of aspirin: Secondary | ICD-10-CM | POA: Insufficient documentation

## 2019-12-09 DIAGNOSIS — I2510 Atherosclerotic heart disease of native coronary artery without angina pectoris: Secondary | ICD-10-CM | POA: Insufficient documentation

## 2019-12-09 DIAGNOSIS — Z1330 Encounter for screening examination for mental health and behavioral disorders, unspecified: Secondary | ICD-10-CM | POA: Insufficient documentation

## 2019-12-09 DIAGNOSIS — I10 Essential (primary) hypertension: Secondary | ICD-10-CM | POA: Insufficient documentation

## 2019-12-09 DIAGNOSIS — Z9114 Patient's other noncompliance with medication regimen: Secondary | ICD-10-CM | POA: Diagnosis not present

## 2019-12-09 DIAGNOSIS — Z20822 Contact with and (suspected) exposure to covid-19: Secondary | ICD-10-CM | POA: Insufficient documentation

## 2019-12-09 DIAGNOSIS — Z7984 Long term (current) use of oral hypoglycemic drugs: Secondary | ICD-10-CM | POA: Insufficient documentation

## 2019-12-09 DIAGNOSIS — Z87891 Personal history of nicotine dependence: Secondary | ICD-10-CM | POA: Insufficient documentation

## 2019-12-09 DIAGNOSIS — F329 Major depressive disorder, single episode, unspecified: Secondary | ICD-10-CM | POA: Insufficient documentation

## 2019-12-09 DIAGNOSIS — Z7901 Long term (current) use of anticoagulants: Secondary | ICD-10-CM | POA: Insufficient documentation

## 2019-12-09 DIAGNOSIS — E119 Type 2 diabetes mellitus without complications: Secondary | ICD-10-CM | POA: Insufficient documentation

## 2019-12-09 DIAGNOSIS — F32A Depression, unspecified: Secondary | ICD-10-CM

## 2019-12-09 DIAGNOSIS — R45851 Suicidal ideations: Secondary | ICD-10-CM | POA: Insufficient documentation

## 2019-12-09 LAB — CBC
HCT: 38.5 % — ABNORMAL LOW (ref 39.0–52.0)
Hemoglobin: 12.9 g/dL — ABNORMAL LOW (ref 13.0–17.0)
MCH: 29 pg (ref 26.0–34.0)
MCHC: 33.5 g/dL (ref 30.0–36.0)
MCV: 86.5 fL (ref 80.0–100.0)
Platelets: 223 10*3/uL (ref 150–400)
RBC: 4.45 MIL/uL (ref 4.22–5.81)
RDW: 12.7 % (ref 11.5–15.5)
WBC: 7.4 10*3/uL (ref 4.0–10.5)
nRBC: 0 % (ref 0.0–0.2)

## 2019-12-09 LAB — RAPID URINE DRUG SCREEN, HOSP PERFORMED
Amphetamines: NOT DETECTED
Barbiturates: NOT DETECTED
Benzodiazepines: NOT DETECTED
Cocaine: POSITIVE — AB
Opiates: NOT DETECTED
Tetrahydrocannabinol: NOT DETECTED

## 2019-12-09 LAB — COMPREHENSIVE METABOLIC PANEL
ALT: 45 U/L — ABNORMAL HIGH (ref 0–44)
AST: 51 U/L — ABNORMAL HIGH (ref 15–41)
Albumin: 4.5 g/dL (ref 3.5–5.0)
Alkaline Phosphatase: 51 U/L (ref 38–126)
Anion gap: 10 (ref 5–15)
BUN: 27 mg/dL — ABNORMAL HIGH (ref 6–20)
CO2: 23 mmol/L (ref 22–32)
Calcium: 8.9 mg/dL (ref 8.9–10.3)
Chloride: 106 mmol/L (ref 98–111)
Creatinine, Ser: 0.97 mg/dL (ref 0.61–1.24)
GFR calc Af Amer: >60 mL/min (ref >60)
GFR calc non Af Amer: >60 mL/min (ref >60)
Glucose, Bld: 94 mg/dL (ref 70–99)
Potassium: 4 mmol/L (ref 3.5–5.1)
Sodium: 139 mmol/L (ref 135–145)
Total Bilirubin: 0.7 mg/dL (ref 0.3–1.2)
Total Protein: 6.9 g/dL (ref 6.5–8.1)

## 2019-12-09 LAB — ETHANOL: Alcohol, Ethyl (B): <10 mg/dL (ref <10)

## 2019-12-09 LAB — SALICYLATE LEVEL: Salicylate Lvl: <7 mg/dL — ABNORMAL LOW (ref 7.0–30.0)

## 2019-12-09 LAB — ACETAMINOPHEN LEVEL: Acetaminophen (Tylenol), Serum: <10 ug/mL — ABNORMAL LOW (ref 10–30)

## 2019-12-09 MED ORDER — AMLODIPINE BESYLATE 5 MG PO TABS: 10.0000 mg | ORAL_TABLET | Freq: Every day | ORAL | Status: DC

## 2019-12-09 MED ORDER — TICAGRELOR 90 MG PO TABS
90.0000 mg | ORAL_TABLET | Freq: Two times a day (BID) | ORAL | Status: DC
  Administered 2019-12-10: 90 mg via ORAL
  Filled 2019-12-09: qty 1

## 2019-12-09 MED ORDER — NITROGLYCERIN 0.4 MG SL SUBL: 0.4000 mg | SUBLINGUAL_TABLET | SUBLINGUAL | Status: DC | PRN

## 2019-12-09 MED ORDER — ISOSORBIDE MONONITRATE ER 60 MG PO TB24: 90.0000 mg | ORAL_TABLET | Freq: Every day | ORAL | Status: DC

## 2019-12-09 NOTE — ED Provider Notes (Signed)
[facility] DEPT Provider Note   CSN: [id number] Arrival date & time: 12/09/19  2127     History Chief Complaint  Patient presents with  . Medical Clearance    [name] is a 59 y.o. male.  HPI Patient is a 59 year old widowed male who presents today for medical clearance in order to be admitted to the [facility] behavioral health hospital.  He is reporting suicidal ideation thoughts of harming himself.  He states he also has had homicidal ideation against a man who is evaluated.  He is followed by a therapist through [facility].  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  Patient states that he has had severe depressive symptoms he denies any auditory visual hallucinations.  He states that he is not taking some of his occasions that he has been prescribed the past because he wants to hurt himself he does not see the point.  Patient denies any physical symptoms of pain today.  Denies any nausea, vomiting, headache, lightheadedness or dizziness no chest pain or shortness of breath.    Past Medical History:  Diagnosis Date  . Allergies   . Arthritis   . Chest pain   . Chronic lower back pain   . Chronic pain of right wrist   . Coronary artery disease    a. Multiple prior caths/PCI. Cath 2013 with possible spasm of RCA, 70% ISR of mid LCx with subsequent DES to mLCx and prox LCX. b. H/o microvascular angina. c. Recurrent angina 08/2014 - s/p PTCA/DES to prox Cx, PTCA/CBA to OM1.  c. LHC 06/10/15 with patent stents and some ISR in LCX and OM-1 that was not flow limiting --> Rx   . Dyslipidemia    a. Intolerant to many statins except tolerating Livalo.  Marland Kitchen GERD (gastroesophageal reflux disease)   . H/O cardiac catheterization 10/25/2018  . Hypertension   . Myocardial infarction ([place]) ~ 2010  . S/P angioplasty with stent, DES, to proximal and mid LCX 12/15/11 12/15/2011  . Shoulder pain    . Stroke [facility])    pt. reports had a stroke around time of MI 2010  . Type II diabetes mellitus ([place])   . Unstable angina [facility])     Patient Active Problem List   Diagnosis Date Noted  . S/p percutaneous right heart catheterization 10/03/2019  . Tinea cruris 07/06/2019  . Hemoglobin A1c less than 7.0% 07/06/2019  . History of cardiac catheterization 04/05/2019  . Muscle strain of wrist 04/05/2019  . Pain in both wrists 04/05/2019  . Anxiety 04/05/2019  . Prediabetes 04/05/2019  . MDD (major depressive disorder) 01/12/2019  . Chest pain 10/25/2018  . MDD (major depressive disorder), recurrent severe, without psychosis ([place]) 08/15/2018  . Influenza A 07/17/2017  . Lactic acidosis   . Cough   . Nausea and vomiting   . Severe episode of recurrent major depressive disorder, without psychotic features ([place])   . MDD (major depressive disorder), single episode, severe with psychotic features ([place]) 06/27/2017  . MDD (major depressive disorder), severe ([place]) 06/23/2017  . Essential hypertension 01/13/2016  . Coronary artery disease involving native coronary artery of native heart without angina pectoris 01/13/2016  . Unstable angina ([place])   . ED (erectile dysfunction) 02/15/2014  . Allergy to contrast media (used for diagnostic x-rays) 05/12/2011  . Hypertension 05/09/2011  . Hyperlipidemia 05/09/2011  . DM2 (diabetes mellitus, type 2) ([place]) 07/10/2010  .  CAD S/P percutaneous coronary angioplasty 07/10/2010  . DYSPEPSIA&OTHER [facility] DISORDERS FUNCTION STOMACH 07/10/2010    Past Surgical History:  Procedure Laterality Date  . CARDIAC CATHETERIZATION  06/15/2002   LAD with prox 40% stenosis, norma L main, Cfx with 25% lesion, RCA with long mid 25% stenosis (Dr. Vita Barley)  . CARDIAC CATHETERIZATION  04/01/2010   normal L main, LAD wit mild stenosis, L Cfx with 70% in-stent restenosis, RCA with 70% in-stent restenosis, LVEF >60% (Dr. K. Mali Hilty) - cutting ballon arthrectomy to RCA &  Cfx (Dr. Rockne Menghini)  . CARDIAC CATHETERIZATION  08/25/2010   preserved global LV contractility; multivessel CAD, diffuse 90-95% in-stent restenosis in prox placed Cfx stent - cutting balloon arthrectomy in Cfx with multiple dilatations 90-95% to 0% stenosis (Dr. Corky Downs)  . CARDIAC CATHETERIZATION  01/26/2011   PCI & stenting of aggresive in-stent restenosis within previously stented AV groove Cfx with 3.0x1mm Taxus DES (previous stents were Promus) (Dr. Adora Fridge)  . CARDIAC CATHETERIZATION  05/11/2011   preserved LV function, 40% mid LAD stenosis, 30-40% narrowing proximal to stented semgnet of prox Cfx, patent mid RCA stent with smooth 20% narrowing in distal RCA (Dr. Corky Downs)  . CARDIAC CATHETERIZATION  12/15/2011   PCI & stenting of proximal & mid Cfx with DES - 3.0x46mm in proximal, 3.0x32mm in mid (Dr. Adora Fridge)  . CARDIAC CATHETERIZATION N/A 06/10/2015   Procedure: Left Heart Cath and Coronary Angiography;  Surgeon: Jolaine Artist, MD;  Location: [place] CV LAB;  Service: Cardiovascular;  Laterality: N/A;  . CARDIAC CATHETERIZATION  10/25/2018  . cardiometabolic testing  10/05/1446   good exercise effort, peak VO2 79% predicted with normal VO2 HR curves (mild deconditioning)  . COLONOSCOPY  12/2012   diminutive hyperplastic sigmoid poyp so repeat routine 2024  . CORONARY BALLOON ANGIOPLASTY N/A 10/25/2018   Procedure: CORONARY BALLOON ANGIOPLASTY;  Surgeon: Jettie Booze, MD;  Location: [place] CV LAB;  Service: Cardiovascular;  Laterality: N/A;  . CORONARY BALLOON ANGIOPLASTY N/A 09/29/2019   Procedure: CORONARY BALLOON ANGIOPLASTY;  Surgeon: Nelva Bush, MD;  Location: [place] CV LAB;  Service: Cardiovascular;  Laterality: N/A;  . EXCISIONAL HEMORRHOIDECTOMY  1984  . INTRAVASCULAR ULTRASOUND/IVUS N/A 09/29/2019   Procedure: Intravascular Ultrasound/IVUS;  Surgeon: Nelva Bush, MD;  Location: [place] CV LAB;  Service: Cardiovascular;  Laterality: N/A;  .  LEFT HEART CATH AND CORONARY ANGIOGRAPHY N/A 10/25/2018   Procedure: LEFT HEART CATH AND CORONARY ANGIOGRAPHY;  Surgeon: Jettie Booze, MD;  Location: [place] CV LAB;  Service: Cardiovascular;  Laterality: N/A;  . LEFT HEART CATH AND CORONARY ANGIOGRAPHY N/A 09/29/2019   Procedure: LEFT HEART CATH AND CORONARY ANGIOGRAPHY;  Surgeon: Nelva Bush, MD;  Location: [place] CV LAB;  Service: Cardiovascular;  Laterality: N/A;  . LEFT HEART CATHETERIZATION WITH CORONARY ANGIOGRAM N/A 05/11/2011   Procedure: LEFT HEART CATHETERIZATION WITH CORONARY ANGIOGRAM;  Surgeon: Troy Sine, MD;  Location: [facility] CATH LAB;  Service: Cardiovascular;  Laterality: N/A;  Possible percutaneous coronary intervention, possible IVUS  . LEFT HEART CATHETERIZATION WITH CORONARY ANGIOGRAM N/A 12/15/2011   Procedure: LEFT HEART CATHETERIZATION WITH CORONARY ANGIOGRAM;  Surgeon: Lorretta Harp, MD;  Location: [facility] CATH LAB;  Service: Cardiovascular;  Laterality: N/A;  . LEFT HEART CATHETERIZATION WITH CORONARY ANGIOGRAM N/A 09/05/2014   Procedure: LEFT HEART CATHETERIZATION WITH CORONARY ANGIOGRAM;  Surgeon: Burnell Blanks, MD;  Location: [facility] CATH LAB;  Service: Cardiovascular;  Laterality: N/A;  . LIPOMA EXCISION  back of the head  . NM MYOCAR PERF WALL MOTION  02/2012   lexiscan myoview; mild perfusion defect in mid inferolateral & basal inferolateral region (infarct/scar); EF 52%, abnormal but ow risk scan  . PERCUTANEOUS CORONARY STENT INTERVENTION (PCI-S)  09/05/2014   Procedure: PERCUTANEOUS CORONARY STENT INTERVENTION (PCI-S);  Surgeon: Burnell Blanks, MD;  Location: [facility] CATH LAB;  Service: Cardiovascular;;       Family History  Problem Relation Age of Onset  . Leukemia Mother   . Prostate cancer Father   . Coronary artery disease Paternal Grandmother   . Cancer Paternal Grandfather   . Cancer Brother     Social History   Tobacco Use  . Smoking status: Former Smoker    Packs/day:  1.00    Years: 10.00    Pack years: 10.00    Types: Cigarettes    Quit date: 10/07/2018    Years since quitting: 1.1  . Smokeless tobacco: Never Used  Vaping Use  . Vaping Use: Never used  Substance Use Topics  . Alcohol use: Yes  . Drug use: No    Comment: 12/15/11 "last cocaine was 2010"    Home Medications Prior to Admission medications   Medication Sig Start Date End Date Taking? Authorizing Provider  amLODipine (NORVASC) 10 MG tablet Take 1 tablet (10 mg total) by mouth daily. 10/04/19  Yes Azzie Glatter, FNP  aspirin 81 MG chewable tablet Chew 1 tablet (81 mg total) by mouth daily. 10/04/19  Yes Azzie Glatter, FNP  co-enzyme Q-10 30 MG capsule Take 30 mg by mouth daily.   Yes [provider]  fluticasone (FLONASE) 50 MCG/ACT nasal spray Place 1 spray into both nostrils daily as needed for allergies or rhinitis.   Yes [provider]  isosorbide mononitrate (IMDUR) 60 MG 24 hr tablet Take 1.5 tablets (90 mg total) by mouth daily. 10/04/19  Yes Kathe Becton M, FNP  LIVALO 4 MG TABS TAKE 1 TABLET BY MOUTH ONCE DAILY IN THE MORNING Patient taking differently: Take 4 mg by mouth daily.  10/04/19  Yes Azzie Glatter, FNP  metFORMIN (GLUCOPHAGE) 500 MG tablet TAKE 1 TABLET BY MOUTH TWICE DAILY WITH A MEAL Patient taking differently: Take 500 mg by mouth 2 (two) times daily with a meal.  10/04/19  Yes Azzie Glatter, FNP  ticagrelor (BRILINTA) 90 MG TABS tablet Take 1 tablet (90 mg total) by mouth 2 (two) times daily. 10/04/19  Yes Azzie Glatter, FNP  ARIPiprazole (ABILIFY) 2 MG tablet Take 1 tablet (2 mg total) by mouth daily. Patient not taking: Reported on 12/09/2019 10/04/19   Azzie Glatter, FNP  famotidine (PEPCID) 20 MG tablet Take 1 tablet (20 mg total) by mouth 2 (two) times daily. Patient not taking: Reported on 12/09/2019 10/04/19   Azzie Glatter, FNP  metoprolol tartrate (LOPRESSOR) 100 MG tablet Take 1 tablet (100 mg total) by mouth 2 (two) times  daily. Patient not taking: Reported on 12/09/2019 10/04/19   Azzie Glatter, FNP  mirtazapine (REMERON) 15 MG tablet Take 1 tablet (15 mg total) by mouth at bedtime. Patient not taking: Reported on 12/09/2019 10/04/19   Azzie Glatter, FNP  nitroGLYCERIN (NITROSTAT) 0.4 MG SL tablet Place 1 tablet (0.4 mg total) under the tongue every 5 (five) minutes x 3 doses as needed for chest pain. 09/30/19   Leanor Kail, PA  ranolazine (RANEXA) 500 MG 12 hr tablet Take 1 tablet (500 mg total) by mouth 2 (  two) times daily. Patient not taking: Reported on 12/09/2019 10/04/19   Azzie Glatter, FNP  vortioxetine HBr (TRINTELLIX) 10 MG TABS tablet Take 1 tablet (10 mg total) by mouth daily. Patient not taking: Reported on 09/28/2019 09/11/19   Johnn Hai, MD    Allergies    Bee venom, Shellfish allergy, Statins, and Testosterone cypionate  Review of Systems   Review of Systems  Constitutional: Negative for chills and fever.  HENT: Negative for congestion.   Eyes: Negative for pain.  Respiratory: Negative for cough and shortness of breath.   Cardiovascular: Negative for chest pain and leg swelling.  Gastrointestinal: Negative for abdominal pain and vomiting.  Genitourinary: Negative for dysuria.  Musculoskeletal: Negative for myalgias.  Skin: Negative for rash.  Neurological: Negative for dizziness and headaches.  Psychiatric/Behavioral: Positive for dysphoric mood and suicidal ideas.    Physical Exam Updated Vital Signs BP 123/82   Pulse 88   Temp 98.2 F (36.8 C)   Resp 16   SpO2 99%   Physical Exam Vitals and nursing note reviewed.  Constitutional:      General: He is not in acute distress. HENT:     Head: Normocephalic and atraumatic.     Nose: Nose normal.  Eyes:     General: No scleral icterus. Cardiovascular:     Rate and Rhythm: Normal rate and regular rhythm.     Pulses: Normal pulses.     Heart sounds: Normal heart sounds.  Pulmonary:     Effort: Pulmonary effort is  normal. No respiratory distress.     Breath sounds: No wheezing.  Abdominal:     Palpations: Abdomen is soft.     Tenderness: There is no abdominal tenderness.  Musculoskeletal:     Cervical back: Normal range of motion.     Right lower leg: No edema.     Left lower leg: No edema.  Skin:    General: Skin is warm and dry.     Capillary Refill: Capillary refill takes less than 2 seconds.  Neurological:     Mental Status: He is alert. Mental status is at baseline.  Psychiatric:        Mood and Affect: Mood normal.        Behavior: Behavior normal.     Comments: Flat affect, pleasant, normal thought content.  Depressive mood.     ED Results / Procedures / Treatments   Labs (all labs ordered are listed, but only abnormal results are displayed) Labs Reviewed  COMPREHENSIVE METABOLIC PANEL - Abnormal; Notable for the following components:      Result Value   BUN 27 (*)    AST 51 (*)    ALT 45 (*)    All other components within normal limits  SALICYLATE LEVEL - Abnormal; Notable for the following components:   Salicylate Lvl <1.6 (*)    All other components within normal limits  ACETAMINOPHEN LEVEL - Abnormal; Notable for the following components:   Acetaminophen (Tylenol), Serum <10 (*)    All other components within normal limits  CBC - Abnormal; Notable for the following components:   Hemoglobin 12.9 (*)    HCT 38.5 (*)    All other components within normal limits  RAPID URINE DRUG SCREEN, HOSP PERFORMED - Abnormal; Notable for the following components:   Cocaine POSITIVE (*)    All other components within normal limits  SARS CORONAVIRUS 2 BY RT PCR (HOSPITAL ORDER, [place] LAB)  ETHANOL  EKG None  Radiology No results found.  Procedures Procedures (including critical care time)  Medications Ordered in ED Medications  amLODipine (NORVASC) tablet 10 mg (has no administration in time range)  isosorbide mononitrate (IMDUR) 24 hr tablet  90 mg (has no administration in time range)  nitroGLYCERIN (NITROSTAT) SL tablet 0.4 mg (has no administration in time range)  ticagrelor (BRILINTA) tablet 90 mg (has no administration in time range)    ED Course  I have reviewed the triage vital signs and the nursing notes.  Pertinent labs & imaging results that were available during my care of the patient were reviewed by me and considered in my medical decision making (see chart for details).  Patient presents to ED for suicidal ideation he has been evaluated by behavioral health and admission has been recommended.  He will be admitted once medically cleared.Marland Kitchen  Physical exam without abnormality.  Clinical Course as of Dec 10 14  Sun Dec 10, 2019  0010 Tylenol, ethanol, salicylate level undetectable.  CBC without leukocytosis or significant anemia.  CMP without significant abnormality.  Mild elevation in BUN patient is likely mildly dehydrated.  UDS positive for cocaine this is consistent with patient's history.   [WF]    Clinical Course User Index [WF] Tedd Sias, PA   MDM Rules/Calculators/A&P                          Patient is medically clear at this time.  Patient is ready for admission to behavioral health hospital and is placed in cycled at this time until he is able to be transported.   Final Clinical Impression(s) / ED Diagnoses Final diagnoses:  Depression, unspecified depression type  Suicidal ideation    Rx / DC Orders ED Discharge Orders    None       Tedd Sias, [place] 12/10/19 0017    Fredia Sorrow, MD 12/18/19 1701

## 2019-12-09 NOTE — ED Triage Notes (Signed)
Pt says he has been diagnosed with major clinical depression, having "thoughts' of SI. Pleasant, cooperative. Pt needs medical clearance to return to [facility] per notes.

## 2019-12-09 NOTE — BH Assessment (Signed)
Assessment Note  [name] is an 59 y.o. widowed male who presents unaccompanied to [place] reporting symptoms of depression including suicidal ideation and thoughts of harming a man who owes him money. Pt says he was referred to [facility] after talking with his therapist, Roxan Diesel at [place]. Pt reports he has been off psychiatric medications for several months, stating he developed teeth grinding and was told it was a result of his psychiatric medications and because his depression improved. Pt says he currently feels severely depressed and acknowledges symptoms including crying spells, social withdrawal, loss of interest in usual pleasures, fatigue, irritability, increased sleep, increased appetite with weight gain, and feelings of guilt, worthlessness and hopelessness. Pt says he has been staying in bed. He says he has numerous medical problems (see below) and has not been taking his medications or monitoring his blood sugar. He reports current suicidal ideation with no specific plan but states he intentionally eats foods that he knows will spike his blood sugar and that he eats unhealthy foods because he doesn't care for his wellbeing. He reports a history of suicide attempts including driving his car off a bridge. He reports thoughts of harming a man in his fellowship who borrowed money from Pt and will not pay it back. Pt says one year ago he had a gun and plan to shoot this person but was unable to follow through. He denies any plan at this time to harm this individual. He denies auditory or visual hallucinations.  Pt reports a history of using alcohol and cocaine. He says he was sober for 10 months and returning to drinking 3 days ago. He says he drank two bottles of wine and snorted a small amount of cocaine. He says his longest period of sobriety is four years.  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 He says being unable to play musical  instruments has been very distressing. He states two cousins recently died from [place] and two years ago he experienced several deaths, including his wife, mother, brother and nephew. He says he lives alone and receives disability. He identifies his older sister as his primary support. Pt denies history of abuse. He denies legal problems. He denies access to firearms.  Pt reports he is currently receiving outpatient therapy but does not have a psychiatrist. He reports a history of several previous psychiatric admissions. He confirms his last psychiatric hospitalization was at [place] in March 2021.  Pt does not identify anyone to contact for collateral information, stating he spends most of his time alone.  Pt is casually dressed, alert and oriented x4. Pt speaks in a clear tone, at moderate volume and normal pace. Motor behavior appears normal. Eye contact is good. Pt's mood is depressed and affect is congruent with mood. Thought process is coherent and relevant. There is no indication Pt is currently responding to internal stimuli or experiencing delusional thought content. Pt was cooperative throughout assessment. He says he wants to resume psychiatric medications and is willing to sign voluntarily into a psychiatric facility.    Diagnosis:  F33.2 Major depressive disorder, Recurrent episode, Severe F10.20 Alcohol use disorder, Severe  Past Medical History:  Past Medical History:  Diagnosis Date  . Allergies   . Arthritis   . Chest pain   . Chronic lower back pain   . Chronic pain of right wrist   . Coronary artery disease    a. Multiple prior caths/PCI.  Cath 2013 with possible spasm of RCA, 70% ISR of mid LCx with subsequent DES to mLCx and prox LCX. b. H/o microvascular angina. c. Recurrent angina 08/2014 - s/p PTCA/DES to prox Cx, PTCA/CBA to OM1.  c. LHC 06/10/15 with patent stents and some ISR in LCX and OM-1 that was not flow limiting --> Rx   . Dyslipidemia    a. Intolerant to many  statins except tolerating Livalo.  Marland Kitchen GERD (gastroesophageal reflux disease)   . H/O cardiac catheterization 10/25/2018  . Hypertension   . Myocardial infarction ([place]) ~ 2010  . S/P angioplasty with stent, DES, to proximal and mid LCX 12/15/11 12/15/2011  . Shoulder pain   . Stroke [facility])    pt. reports had a stroke around time of MI 2010  . Type II diabetes mellitus ([place])   . Unstable angina [facility])     Past Surgical History:  Procedure Laterality Date  . CARDIAC CATHETERIZATION  06/15/2002   LAD with prox 40% stenosis, norma L main, Cfx with 25% lesion, RCA with long mid 25% stenosis (Dr. Vita Barley)  . CARDIAC CATHETERIZATION  04/01/2010   normal L main, LAD wit mild stenosis, L Cfx with 70% in-stent restenosis, RCA with 70% in-stent restenosis, LVEF >60% (Dr. K. Mali Hilty) - cutting ballon arthrectomy to RCA & Cfx (Dr. Rockne Menghini)  . CARDIAC CATHETERIZATION  08/25/2010   preserved global LV contractility; multivessel CAD, diffuse 90-95% in-stent restenosis in prox placed Cfx stent - cutting balloon arthrectomy in Cfx with multiple dilatations 90-95% to 0% stenosis (Dr. Corky Downs)  . CARDIAC CATHETERIZATION  01/26/2011   PCI & stenting of aggresive in-stent restenosis within previously stented AV groove Cfx with 3.0x71mm Taxus DES (previous stents were Promus) (Dr. Adora Fridge)  . CARDIAC CATHETERIZATION  05/11/2011   preserved LV function, 40% mid LAD stenosis, 30-40% narrowing proximal to stented semgnet of prox Cfx, patent mid RCA stent with smooth 20% narrowing in distal RCA (Dr. Corky Downs)  . CARDIAC CATHETERIZATION  12/15/2011   PCI & stenting of proximal & mid Cfx with DES - 3.0x65mm in proximal, 3.0x83mm in mid (Dr. Adora Fridge)  . CARDIAC CATHETERIZATION N/A 06/10/2015   Procedure: Left Heart Cath and Coronary Angiography;  Surgeon: Jolaine Artist, MD;  Location: [place] CV LAB;  Service: Cardiovascular;  Laterality: N/A;  . CARDIAC CATHETERIZATION  10/25/2018  . cardiometabolic  testing  6/57/8469   good exercise effort, peak VO2 79% predicted with normal VO2 HR curves (mild deconditioning)  . COLONOSCOPY  12/2012   diminutive hyperplastic sigmoid poyp so repeat routine 2024  . CORONARY BALLOON ANGIOPLASTY N/A 10/25/2018   Procedure: CORONARY BALLOON ANGIOPLASTY;  Surgeon: Jettie Booze, MD;  Location: [place] CV LAB;  Service: Cardiovascular;  Laterality: N/A;  . CORONARY BALLOON ANGIOPLASTY N/A 09/29/2019   Procedure: CORONARY BALLOON ANGIOPLASTY;  Surgeon: Nelva Bush, MD;  Location: [place] CV LAB;  Service: Cardiovascular;  Laterality: N/A;  . EXCISIONAL HEMORRHOIDECTOMY  1984  . INTRAVASCULAR ULTRASOUND/IVUS N/A 09/29/2019   Procedure: Intravascular Ultrasound/IVUS;  Surgeon: Nelva Bush, MD;  Location: [place] CV LAB;  Service: Cardiovascular;  Laterality: N/A;  . LEFT HEART CATH AND CORONARY ANGIOGRAPHY N/A 10/25/2018   Procedure: LEFT HEART CATH AND CORONARY ANGIOGRAPHY;  Surgeon: Jettie Booze, MD;  Location: [place] CV LAB;  Service: Cardiovascular;  Laterality: N/A;  . LEFT HEART CATH AND CORONARY ANGIOGRAPHY N/A 09/29/2019   Procedure: LEFT HEART CATH AND CORONARY ANGIOGRAPHY;  Surgeon: Nelva Bush, MD;  Location: [place] CV LAB;  Service: Cardiovascular;  Laterality: N/A;  . LEFT HEART CATHETERIZATION WITH CORONARY ANGIOGRAM N/A 05/11/2011   Procedure: LEFT HEART CATHETERIZATION WITH CORONARY ANGIOGRAM;  Surgeon: Troy Sine, MD;  Location: [facility] CATH LAB;  Service: Cardiovascular;  Laterality: N/A;  Possible percutaneous coronary intervention, possible IVUS  . LEFT HEART CATHETERIZATION WITH CORONARY ANGIOGRAM N/A 12/15/2011   Procedure: LEFT HEART CATHETERIZATION WITH CORONARY ANGIOGRAM;  Surgeon: Lorretta Harp, MD;  Location: [facility] CATH LAB;  Service: Cardiovascular;  Laterality: N/A;  . LEFT HEART CATHETERIZATION WITH CORONARY ANGIOGRAM N/A 09/05/2014   Procedure: LEFT HEART CATHETERIZATION WITH CORONARY ANGIOGRAM;   Surgeon: Burnell Blanks, MD;  Location: [facility] CATH LAB;  Service: Cardiovascular;  Laterality: N/A;  . LIPOMA EXCISION     back of the head  . NM MYOCAR PERF WALL MOTION  02/2012   lexiscan myoview; mild perfusion defect in mid inferolateral & basal inferolateral region (infarct/scar); EF 52%, abnormal but ow risk scan  . PERCUTANEOUS CORONARY STENT INTERVENTION (PCI-S)  09/05/2014   Procedure: PERCUTANEOUS CORONARY STENT INTERVENTION (PCI-S);  Surgeon: Burnell Blanks, MD;  Location: [facility] CATH LAB;  Service: Cardiovascular;;    Family History:  Family History  Problem Relation Age of Onset  . Leukemia Mother   . Prostate cancer Father   . Coronary artery disease Paternal Grandmother   . Cancer Paternal Grandfather   . Cancer Brother     Social History:  reports that he quit smoking about 14 months ago. His smoking use included cigarettes. He has a 10.00 pack-year smoking history. He has never used smokeless tobacco. He reports current alcohol use. He reports that he does not use drugs.  Additional Social History:  Alcohol / Drug Use Pain Medications: Denies abuse Prescriptions: Denies abuse Over the Counter: Denies abuse History of alcohol / drug use?: Yes Longest period of sobriety (when/how long): 4 years Negative Consequences of Use: Financial, Personal relationships Substance #1 Name of Substance 1: Alcohol 1 - Age of First Use: 22 1 - Amount (size/oz): Varies 1 - Frequency: Daily 1 - Duration: Relapsed 3 days ago after 10 months 1 - Last Use / Amount: 12/08/2019 Substance #2 Name of Substance 2: Cocaine (powder) 2 - Age of First Use: 22 2 - Amount (size/oz): Varies 2 - Frequency: "Infrequent" 2 - Duration: Ongoing 2 - Last Use / Amount: 12/07/2019  CIWA: CIWA-Ar BP: 135/85 Pulse Rate: 70 COWS:    Allergies:  Allergies  Allergen Reactions  . Bee Venom Anaphylaxis and Hives  . Shellfish Allergy Anaphylaxis and Hives  . Statins Other (See Comments)     Myalgias. Tolerating livalo.   . Testosterone Cypionate     Testerone Injection --Increased breast tissue     Home Medications: (Not in a hospital admission)   OB/GYN Status:  No LMP for male patient.  General Assessment Data Location of Assessment: [facility] Assessment Services TTS Assessment: In system Is this a Tele or Face-to-Face Assessment?: Face-to-Face Is this an Initial Assessment or a Re-assessment for this encounter?: Initial Assessment Patient Accompanied by:: N/A Language Other than English: No Living Arrangements: Other (Comment) (Lives alone) What gender do you identify as?: Male Marital status: Widowed [place] name: NA Pregnancy Status: No Living Arrangements: Alone Can pt return to current living arrangement?: Yes Admission Status: Voluntary Is patient capable of signing voluntary admission?: Yes Referral Source: Other (Therapist: Roxan Diesel) Insurance type: Medicaid  Medical Screening Exam ([place]) Medical Exam completed: Yes Lindon Romp,  FNP)  Crisis Care Plan Living Arrangements: Alone Legal Guardian: Other: (Self) Name of Psychiatrist: None Name of Therapist: [facility]: Roxan Diesel  Education Status Is patient currently in school?: No Is the patient employed, unemployed or receiving disability?: Receiving disability income  Risk to self with the past 6 months Suicidal Ideation: Yes-Currently Present Has patient been a risk to self within the past 6 months prior to admission? : Yes Suicidal Intent: No Has patient had any suicidal intent within the past 6 months prior to admission? : No Is patient at risk for suicide?: Yes Suicidal Plan?: No Has patient had any suicidal plan within the past 6 months prior to admission? : No Access to Means: No What has been your use of drugs/alcohol within the last 12 months?: Pt using alcohol and cocaine Previous Attempts/Gestures: Yes How many times?: 2 (Drove car off bridge) Other Self Harm  Risks: Pt not taking medical medications Triggers for Past Attempts: Unknown Intentional Self Injurious Behavior: Damaging (Pt reports eating foods he knows will spike his blood sugar) Comment - Self Injurious Behavior: Pt reports intentionally eating foods he knows spike his blood sugar Family Suicide History: Yes (Maternal uncle, paternal great grandmother died by suicide) Recent stressful life event(s): Other (Comment), Loss (Comment) (Unable to play music, recents deaths of cousins) Persecutory voices/beliefs?: No Depression: Yes Depression Symptoms: Despondent, Tearfulness, Isolating, Fatigue, Guilt, Loss of interest in usual pleasures, Feeling worthless/self pity Substance abuse history and/or treatment for substance abuse?: Yes Suicide prevention information given to non-admitted patients: Not applicable  Risk to Others within the past 6 months Homicidal Ideation: Yes-Currently Present Does patient have any lifetime risk of violence toward others beyond the six months prior to admission? : No Thoughts of Harm to Others: Yes-Currently Present Comment - Thoughts of Harm to Others: Thought of harming man who owes him money Current Homicidal Intent: No Current Homicidal Plan: No Access to Homicidal Means: No Identified Victim: Man in his fellowship who owes him money History of harm to others?: No Assessment of Violence: None Noted Violent Behavior Description: Pt denies history of violence Does patient have access to weapons?: No Criminal Charges Pending?: No Does patient have a court date: No Is patient on probation?: No  Psychosis Hallucinations: None noted Delusions: None noted  Mental Status Report Appearance/Hygiene: Other (Comment) (Casually dressed) Eye Contact: Good Motor Activity: Unremarkable, Freedom of movement Speech: Logical/coherent Level of Consciousness: Alert Mood: Depressed Affect: Depressed Anxiety Level: None Thought Processes: Coherent,  Relevant Judgement: Partial Orientation: Person, Place, Time, Situation Obsessive Compulsive Thoughts/Behaviors: None  Cognitive Functioning Concentration: Normal Memory: Recent Intact, Remote Intact Is patient IDD: No Insight: Fair Impulse Control: Fair Appetite: Good Have you had any weight changes? : Gain Amount of the weight change? (lbs): 22 lbs Sleep: Increased Total Hours of Sleep: 12 Vegetative Symptoms: Staying in bed  ADLScreening [facility] Assessment Services) Patient's cognitive ability adequate to safely complete daily activities?: Yes Patient able to express need for assistance with ADLs?: Yes Independently performs ADLs?: Yes (appropriate for developmental age)  Prior Inpatient Therapy Prior Inpatient Therapy: Yes Prior Therapy Dates: 08/2019, multiple admits Prior Therapy Facilty/Provider(s): [facility], other facilities Reason for Treatment: MDD, substance use  Prior Outpatient Therapy Prior Outpatient Therapy: Yes Prior Therapy Dates: Currently Prior Therapy Facilty/Provider(s): [facility] Reason for Treatment: MDD Does patient have an ACCT team?: No Does patient have Intensive In-House Services?  : No Does patient have [facility] services? : Yes Does patient have P4CC services?: No  ADL Screening (condition at  time of admission) Patient's cognitive ability adequate to safely complete daily activities?: Yes Is the patient deaf or have difficulty hearing?: No Does the patient have difficulty seeing, even when wearing glasses/contacts?: No Does the patient have difficulty concentrating, remembering, or making decisions?: No Patient able to express need for assistance with ADLs?: Yes Does the patient have difficulty dressing or bathing?: No Independently performs ADLs?: Yes (appropriate for developmental age) Does the patient have difficulty walking or climbing stairs?: No Weakness of Legs: None Weakness of Arms/Hands: None  Home Assistive Devices/Equipment Home  Assistive Devices/Equipment: None    Abuse/Neglect Assessment (Assessment to be complete while patient is alone) Abuse/Neglect Assessment Can Be Completed: Yes Physical Abuse: Denies Verbal Abuse: Denies Sexual Abuse: Denies Exploitation of patient/patient's resources: Denies Self-Neglect: Denies     Regulatory affairs officer (For Healthcare) Does Patient Have a Medical Advance Directive?: Yes Does patient want to make changes to medical advance directive?: No - Patient declined Type of Advance Directive: Healthcare Power of [place] in Chart?: Yes - validated most recent copy scanned in chart (See row information) Healthcare Power of Attorney Requested and Now in Chart: Copy in chart          Disposition: Gave clinical report to Lindon Romp, FNP who completed MSE and recommended inpatient psychiatric treatment pending medical clearance. Lavell Luster, [facility], confirms bed is available. Pt to be transferred to [facility] via [facility]. Notified [facility] charge RN of transfer.  Disposition Initial Assessment Completed for this Encounter: Yes Disposition of Patient: Movement to [facility] or [facility] ED, Admit Type of inpatient treatment program: Adult Patient refused recommended treatment: No  On Site Evaluation by:  Lindon Romp, FNP Reviewed with Physician:    Evelena Peat, [facility], [facility] Triage Specialist [phone]  Anson Fret, Or[place]ah Greek 12/09/2019 8:46 PM

## 2019-12-10 ENCOUNTER — Encounter (HOSPITAL_COMMUNITY): Payer: Self-pay | Admitting: Nurse Practitioner

## 2019-12-10 ENCOUNTER — Inpatient Hospital Stay (HOSPITAL_COMMUNITY)
Admit: 2019-12-10 | Discharge: 2019-12-13 | DRG: 885 | Disposition: A | Discharge status: Home / Self Care | Payer: Medicaid Other | Source: Intra-hospital | Attending: Psychiatry | Admitting: Psychiatry

## 2019-12-10 DIAGNOSIS — G4710 Hypersomnia, unspecified: Secondary | ICD-10-CM | POA: Diagnosis present

## 2019-12-10 DIAGNOSIS — Z87891 Personal history of nicotine dependence: Secondary | ICD-10-CM | POA: Diagnosis not present

## 2019-12-10 DIAGNOSIS — F1410 Cocaine abuse, uncomplicated: Secondary | ICD-10-CM | POA: Diagnosis present

## 2019-12-10 DIAGNOSIS — Z7902 Long term (current) use of antithrombotics/antiplatelets: Secondary | ICD-10-CM | POA: Diagnosis not present

## 2019-12-10 DIAGNOSIS — Z888 Allergy status to other drugs, medicaments and biological substances status: Secondary | ICD-10-CM

## 2019-12-10 DIAGNOSIS — Z806 Family history of leukemia: Secondary | ICD-10-CM | POA: Diagnosis not present

## 2019-12-10 DIAGNOSIS — Z91013 Allergy to seafood: Secondary | ICD-10-CM | POA: Diagnosis not present

## 2019-12-10 DIAGNOSIS — Z800 Family history of malignant neoplasm of digestive organs: Secondary | ICD-10-CM

## 2019-12-10 DIAGNOSIS — Z808 Family history of malignant neoplasm of other organs or systems: Secondary | ICD-10-CM | POA: Diagnosis not present

## 2019-12-10 DIAGNOSIS — Z8249 Family history of ischemic heart disease and other diseases of the circulatory system: Secondary | ICD-10-CM

## 2019-12-10 DIAGNOSIS — I10 Essential (primary) hypertension: Secondary | ICD-10-CM | POA: Diagnosis present

## 2019-12-10 DIAGNOSIS — Z79899 Other long term (current) drug therapy: Secondary | ICD-10-CM

## 2019-12-10 DIAGNOSIS — F1010 Alcohol abuse, uncomplicated: Secondary | ICD-10-CM | POA: Diagnosis present

## 2019-12-10 DIAGNOSIS — I2510 Atherosclerotic heart disease of native coronary artery without angina pectoris: Secondary | ICD-10-CM | POA: Diagnosis present

## 2019-12-10 DIAGNOSIS — F332 Major depressive disorder, recurrent severe without psychotic features: Principal | ICD-10-CM | POA: Diagnosis present

## 2019-12-10 DIAGNOSIS — R45850 Homicidal ideations: Secondary | ICD-10-CM | POA: Diagnosis present

## 2019-12-10 DIAGNOSIS — F323 Major depressive disorder, single episode, severe with psychotic features: Secondary | ICD-10-CM | POA: Diagnosis present

## 2019-12-10 DIAGNOSIS — Z955 Presence of coronary angioplasty implant and graft: Secondary | ICD-10-CM

## 2019-12-10 DIAGNOSIS — Z8042 Family history of malignant neoplasm of prostate: Secondary | ICD-10-CM | POA: Diagnosis not present

## 2019-12-10 DIAGNOSIS — Z915 Personal history of self-harm: Secondary | ICD-10-CM

## 2019-12-10 DIAGNOSIS — Z7984 Long term (current) use of oral hypoglycemic drugs: Secondary | ICD-10-CM

## 2019-12-10 DIAGNOSIS — F322 Major depressive disorder, single episode, severe without psychotic features: Secondary | ICD-10-CM | POA: Diagnosis present

## 2019-12-10 DIAGNOSIS — R45851 Suicidal ideations: Secondary | ICD-10-CM | POA: Diagnosis present

## 2019-12-10 DIAGNOSIS — K5900 Constipation, unspecified: Secondary | ICD-10-CM | POA: Diagnosis present

## 2019-12-10 DIAGNOSIS — E119 Type 2 diabetes mellitus without complications: Secondary | ICD-10-CM | POA: Diagnosis present

## 2019-12-10 DIAGNOSIS — F419 Anxiety disorder, unspecified: Secondary | ICD-10-CM | POA: Diagnosis present

## 2019-12-10 DIAGNOSIS — Z91030 Bee allergy status: Secondary | ICD-10-CM

## 2019-12-10 DIAGNOSIS — F1911 Other psychoactive substance abuse, in remission: Secondary | ICD-10-CM | POA: Diagnosis present

## 2019-12-10 DIAGNOSIS — F329 Major depressive disorder, single episode, unspecified: Secondary | ICD-10-CM | POA: Diagnosis present

## 2019-12-10 DIAGNOSIS — Z7982 Long term (current) use of aspirin: Secondary | ICD-10-CM | POA: Diagnosis not present

## 2019-12-10 LAB — LIPID PANEL
Cholesterol: 142 mg/dL (ref 0–200)
HDL: 41 mg/dL (ref >40)
LDL Cholesterol: 72 mg/dL (ref 0–99)
Total CHOL/HDL Ratio: 3.5 RATIO
Triglycerides: 146 mg/dL (ref <150)
VLDL: 29 mg/dL (ref 0–40)

## 2019-12-10 LAB — SARS CORONAVIRUS 2 BY RT PCR (HOSPITAL ORDER, PERFORMED IN ~~LOC~~ HOSPITAL LAB): SARS Coronavirus 2: NEGATIVE

## 2019-12-10 LAB — TSH: TSH: 2.723 u[IU]/mL (ref 0.350–4.500)

## 2019-12-10 MED ORDER — TRAZODONE HCL 50 MG PO TABS: 50.0000 mg | ORAL_TABLET | Freq: Every evening | ORAL | Status: DC | PRN

## 2019-12-10 MED ORDER — LORAZEPAM 1 MG PO TABS: 1.0000 mg | ORAL_TABLET | Freq: Every day | ORAL | Status: DC

## 2019-12-10 MED ORDER — ALUM & MAG HYDROXIDE-SIMETH 200-200-20 MG/5ML PO SUSP: 30.0000 mL | ORAL | Status: DC | PRN

## 2019-12-10 MED ORDER — MAGNESIUM HYDROXIDE 400 MG/5ML PO SUSP: 30.0000 mL | Freq: Every day | ORAL | Status: DC | PRN

## 2019-12-10 MED ORDER — AMLODIPINE BESYLATE 10 MG PO TABS
10.0000 mg | ORAL_TABLET | Freq: Every day | ORAL | Status: DC
  Administered 2019-12-10 – 2019-12-13 (×4): 10 mg via ORAL
  Filled 2019-12-10 (×6): qty 1

## 2019-12-10 MED ORDER — LORAZEPAM 1 MG PO TABS
1.0000 mg | ORAL_TABLET | Freq: Four times a day (QID) | ORAL | Status: DC
  Administered 2019-12-10 (×2): 1 mg via ORAL
  Filled 2019-12-10 (×2): qty 1

## 2019-12-10 MED ORDER — MIRTAZAPINE 7.5 MG PO TABS: 7.5000 mg | ORAL_TABLET | Freq: Every day | ORAL | Status: DC

## 2019-12-10 MED ORDER — ISOSORBIDE MONONITRATE ER 60 MG PO TB24
90.0000 mg | ORAL_TABLET | Freq: Every day | ORAL | Status: DC
  Administered 2019-12-10 – 2019-12-12 (×3): 90 mg via ORAL
  Filled 2019-12-10 (×4): qty 1

## 2019-12-10 MED ORDER — ACETAMINOPHEN 325 MG PO TABS
650.0000 mg | ORAL_TABLET | Freq: Four times a day (QID) | ORAL | Status: DC | PRN
  Administered 2019-12-11 – 2019-12-12 (×3): 650 mg via ORAL
  Filled 2019-12-10 (×3): qty 2

## 2019-12-10 MED ORDER — LORAZEPAM 1 MG PO TABS
1.0000 mg | ORAL_TABLET | Freq: Four times a day (QID) | ORAL | Status: AC | PRN
  Administered 2019-12-12: 1 mg via ORAL
  Filled 2019-12-10: qty 1

## 2019-12-10 MED ORDER — MIRTAZAPINE 15 MG PO TABS
15.0000 mg | ORAL_TABLET | Freq: Every day | ORAL | Status: DC
  Administered 2019-12-10 – 2019-12-12 (×3): 15 mg via ORAL
  Filled 2019-12-10 (×6): qty 1

## 2019-12-10 MED ORDER — TICAGRELOR 90 MG PO TABS
90.0000 mg | ORAL_TABLET | Freq: Two times a day (BID) | ORAL | Status: DC
  Administered 2019-12-10 – 2019-12-13 (×7): 90 mg via ORAL
  Filled 2019-12-10 (×11): qty 1

## 2019-12-10 MED ORDER — ONDANSETRON 4 MG PO TBDP: 4.0000 mg | ORAL_TABLET | Freq: Four times a day (QID) | ORAL | Status: DC | PRN

## 2019-12-10 MED ORDER — HYDROXYZINE HCL 25 MG PO TABS: 25.0000 mg | ORAL_TABLET | Freq: Three times a day (TID) | ORAL | Status: DC | PRN

## 2019-12-10 MED ORDER — ADULT MULTIVITAMIN W/MINERALS CH
1.0000 | ORAL_TABLET | Freq: Every day | ORAL | Status: DC
  Administered 2019-12-10 – 2019-12-13 (×4): 1 via ORAL
  Filled 2019-12-10 (×6): qty 1

## 2019-12-10 MED ORDER — LORAZEPAM 1 MG PO TABS: 1.0000 mg | ORAL_TABLET | Freq: Two times a day (BID) | ORAL | Status: DC

## 2019-12-10 MED ORDER — LORAZEPAM 1 MG PO TABS: 1.0000 mg | ORAL_TABLET | Freq: Three times a day (TID) | ORAL | Status: DC

## 2019-12-10 MED ORDER — THIAMINE HCL 100 MG PO TABS
100.0000 mg | ORAL_TABLET | Freq: Every day | ORAL | Status: DC
  Administered 2019-12-11 – 2019-12-13 (×3): 100 mg via ORAL
  Filled 2019-12-10 (×5): qty 1

## 2019-12-10 MED ORDER — NITROGLYCERIN 0.4 MG SL SUBL: 0.4000 mg | SUBLINGUAL_TABLET | SUBLINGUAL | Status: DC | PRN

## 2019-12-10 MED ORDER — LOPERAMIDE HCL 2 MG PO CAPS: 2.0000 mg | ORAL_CAPSULE | ORAL | Status: DC | PRN

## 2019-12-10 MED ORDER — ASPIRIN EC 81 MG PO TBEC
81.0000 mg | DELAYED_RELEASE_TABLET | Freq: Every day | ORAL | Status: DC
  Administered 2019-12-10 – 2019-12-13 (×4): 81 mg via ORAL
  Filled 2019-12-10 (×7): qty 1

## 2019-12-10 MED ORDER — METFORMIN HCL 500 MG PO TABS
500.0000 mg | ORAL_TABLET | Freq: Two times a day (BID) | ORAL | Status: DC
  Administered 2019-12-10 – 2019-12-13 (×6): 500 mg via ORAL
  Filled 2019-12-10 (×11): qty 1

## 2019-12-10 NOTE — Progress Notes (Signed)
Pt was a walk in to [facility] that was sent over to [facility] for medical clearance. Pt is a 59 y.o. male that presents voluntarily to [facility] for worsening symptoms of depression and SI without a plan. Pt said that he was diagnosed with major clinical depression several years ago and at that time was taking remeron 30 mg. He stopped taking his remeron several months ago because at a routine dental visit, his dentist noticed that he had bruxism. Pt said he had the bruxism from the antidepressant he was on and at that time felt like he no longer needed to take it. "I was feeling good", so he stopped taking the remeron. Pt said that his goal is to "get back on an antidepressant that will work out" without any adverse effects. He also said that he would like to work towards "getting back to feeling normal" since lately he's had "no motivation." He reports sleeping a lot and an increased appetite. Weight gain of 21 lbs. Reports being sober for 10 months, but then relapsing 3 days ago. He said he drank 2 bottles of wine and cannot recall how much of cocaine he used. He said "not much." He reports a hx of type 2 DM but hasn't been checking his blood glucose levels because "it's normally in range."   Stressors: Widowed, living alone, and receiving disability. 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 Recent loss of two 1st cousins from covid. Loss of wife, mother, brother, and nephew 2 years ago.  Pt denies currently having any HI towards the man from his fellowship that had borrowed money from him, but had never paid it back. Passively suicidal with no plan and verbally contracts for safety. Denies AVH.   Unit rules and policies discussed with pt. Consents signed. Tour of unit provided and belongings secured in locker. Skin assessment completed. Q 15 minute safety checks initiated.

## 2019-12-10 NOTE — Progress Notes (Signed)
   12/10/19 0140  COVID-19 Daily Checkoff  Have you had a fever (temp > 37.80C/100F)  in the past 24 hours?  No  COVID-19 EXPOSURE  Have you traveled outside the state in the past 14 days? No  Have you been in contact with someone with a confirmed diagnosis of COVID-19 or PUI in the past 14 days without wearing appropriate PPE? No  Have you been living in the same home as a person with confirmed diagnosis of COVID-19 or a PUI (household contact)? No  Have you been diagnosed with COVID-19? No

## 2019-12-10 NOTE — ED Notes (Signed)
Pt ambulatory to safe transport vehicle with his belongings to [facility]

## 2019-12-10 NOTE — Progress Notes (Signed)
Patient did not attend wrap up group because he was asleep.

## 2019-12-10 NOTE — BHH Suicide Risk Assessment (Signed)
[facility] Admission Suicide Risk Assessment   Nursing information obtained from:  Patient Demographic factors:  Male, Divorced or widowed, Low socioeconomic status, Living alone, Unemployed Current Mental Status:  Suicidal ideation indicated by patient (with no plan, verbally contracts for safety) Loss Factors:  Decrease in vocational status, Loss of significant relationship, Decline in physical health, Financial problems / change in socioeconomic status Historical Factors:  Prior suicide attempts, Anniversary of important loss Risk Reduction Factors:  Sense of responsibility to family, Positive social support  Total Time spent with patient: 45 minutes Principal Problem: MDD, Alcohol Use Disorder by history Diagnosis:  Active Problems:   Severe 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 features ([place])  Subjective Data:  , Continued Clinical Symptoms:    The "Alcohol Use Disorders Identification Test", Guidelines for Use in Primary Care, Second Edition.  World Pharmacologist [facility]). Score between 0-7:  no or low risk or alcohol related problems. Score between 8-15:  moderate risk of alcohol related problems. Score between 16-19:  high risk of alcohol related problems. Score 20 or above:  warrants further diagnostic evaluation for alcohol dependence and treatment.   CLINICAL FACTORS:  59 year old male, presented to ED voluntarily, reporting worsening depression and suicidal ideations.  Also reported intermittent homicidal ideations towards a man whom he states owes money.  Reports a history of recurrent depression, alcohol use disorder and to a lesser degree past history of cocaine abuse as well.  He reports he had been doing well, without depression and functioning well mood activities up to several months ago when he stopped his psychiatric medications due to concerns that they were contributing to bruxism.  Since then he reports a gradual worsening of his mood and currently endorses  depression, passive SI, reports he has been neglecting his diet and not taking his medications regularly, "not caring if I live or die".  He relapsed on alcohol and cocaine x2 days earlier this week following a period of almost a year sobriety.  He has a medical history of hypertension, coronary artery disease, history of angioplasty/stents, diabetes mellitus.   Psychiatric Specialty Exam: Physical Exam  Review of Systems  Blood pressure 98/64, pulse 68, temperature 97.9 F (36.6 C), temperature source Oral, resp. rate 20, height 5\' 8"  (1.727 m), weight 86.6 kg, SpO2 92 %.Body mass index is 29.04 kg/m.  Treatment note MSE    COGNITIVE FEATURES THAT CONTRIBUTE TO RISK:  Closed-mindedness, Loss of executive function and Polarized thinking    SUICIDE RISK:   Moderate:  Frequent suicidal ideation with limited intensity, and duration, some specificity in terms of plans, no associated intent, good self-control, limited dysphoria/symptomatology, some risk factors present, and identifiable protective factors, including available and accessible social support.  PLAN OF CARE: Patient will be admitted to inpatient psychiatric unit for stabilization and safety. Will provide and encourage milieu participation. Provide medication management and maked adjustments as needed.  Will follow daily.    I certify that inpatient services furnished can reasonably be expected to improve the patient's condition.   Jenne Campus, MD 12/10/2019, 3:40 PM

## 2019-12-10 NOTE — [facility] Group Notes (Signed)
Adult Psychoeducational Group Note  Date:  12/10/2019  Time:  9:00am-9:45am  Group Topic/Focus: PROGRESSIVE RELAXATION. A group where deep breathing is taught and tensing and relaxation muscle groups is used. Imagery is used as well.  Pts are asked to imagine 3 pillars that hold them up when they are not able to hold themselves up.  Participation Level:  Minimal  Participation Quality:  Appropriate  Affect:  Appropriate  Cognitive:  Oriented  Insight: Lacking  Engagement in Group:  Engaged  Modes of Intervention:  Activity, Discussion, Education, and Support  Additional Comments: Rates his energy as a 1/10 Pt states his 3 pillars are:God, the program of recovery and the gym.   Bryson Dames A 12:41 PM
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
## 2019-12-10 NOTE — Tx Team (Signed)
Initial Treatment Plan 12/10/2019 3:19 AM Lawson Fiscal POE:[id number]    PATIENT STRESSORS: Health problems Loss of wife, mother, brother, and nephew 2 years ago Medication change or noncompliance Substance abuse (was sober for 10 months, but relapsed 3 days ago)  PATIENT STRENGTHS: Ability for insight Average or above average intelligence Capable of independent living Agricultural engineer for treatment/growth Supportive family/friends   PATIENT IDENTIFIED PROBLEMS: depression  anxiety  "needing to get back on an antidepressant that will work out for me"  "getting back to feeling normal"  "no motivation"             DISCHARGE CRITERIA:  Improved stabilization in mood, thinking, and/or behavior Medical problems require only outpatient monitoring Motivation to continue treatment in a less acute level of care Verbal commitment to aftercare and medication compliance  PRELIMINARY DISCHARGE PLAN: Attend PHP/IOP Outpatient therapy Return to previous living arrangement  PATIENT/FAMILY INVOLVEMENT: This treatment plan has been presented to and reviewed with the patient, [name].The patient and family have been given the opportunity to ask questions and make suggestions.  Harlow Asa, RN 12/10/2019, 3:19 AM

## 2019-12-10 NOTE — [facility] Group Notes (Signed)
Adult Psychoeducational Group Note  Date:  12/10/2019 Time:  3:18 PM  Group Topic/Focus:  Making Healthy Choices:   The focus of this group is to help patients identify negative/unhealthy choices they were using prior to admission and identify positive/healthier coping strategies to replace them upon discharge.  Participation Level:  Did Not Attend  Paulino Rily 12/10/2019, 3:18 PM

## 2019-12-10 NOTE — H&P (Signed)
Behavioral Health Medical Screening Exam  [name] is an 59 y.o. male.  Total Time spent with patient: 20 minutes  Psychiatric Specialty Exam: Physical Exam  Constitutional: He is oriented to person, place, and time.  Non-toxic appearance. He does not appear ill. No distress.  HENT:  Head: Normocephalic.  Right Ear: External ear normal.  Left Ear: External ear normal.  Eyes: Pupils are equal, round, and reactive to light.  Respiratory: Effort normal. No respiratory distress.  GI: Normal appearance.  Musculoskeletal:        General: Normal range of motion.  Neurological: He is alert and oriented to person, place, and time.  Skin: He is not diaphoretic.  Psychiatric: His speech is normal and behavior is normal. His mood appears anxious. He exhibits a depressed mood. He expresses homicidal and suicidal ideation. He expresses no suicidal plans and no homicidal plans.   Review of Systems  Constitutional: Positive for activity change, appetite change and unexpected weight change. Negative for chills, diaphoresis, fatigue and fever.  HENT: Negative for congestion.   Respiratory: Negative for cough, chest tightness and shortness of breath.   Cardiovascular: Positive for chest pain (chest pain with exertion only). Negative for palpitations.  Gastrointestinal: Negative for diarrhea, nausea and vomiting.  Psychiatric/Behavioral: Positive for decreased concentration, dysphoric mood, sleep disturbance and suicidal ideas. The patient is nervous/anxious.   All other systems reviewed and are negative.  Blood pressure 135/85, pulse 70, temperature 98.2 F (36.8 C), temperature source Oral, resp. rate 20, SpO2 99 %.There is no height or weight on file to calculate BMI. General Appearance: Casual and Fairly Groomed Eye Contact:  Fair Speech:  Clear and Coherent and Normal Rate Volume:  Decreased Mood:  Anxious, Depressed, Hopeless and Worthless Affect:  Congruent and Depressed Thought  Process:  Coherent, Goal Directed, Linear and Descriptions of Associations: Intact Orientation:  Full (Time, Place, and Person) Thought Content:  Logical Suicidal Thoughts:  Yes.  without intent/plan Homicidal Thoughts:  Yes.  without intent/plan Memory:  Immediate;   Fair Recent;   Fair Remote;   Fair Judgement:  Impaired Insight:  Lacking Psychomotor Activity:  Normal Concentration: Concentration: Fair and Attention Span: Fair Recall:  [employer] of Knowledge:Good Language: Good Akathisia:  Negative Handed:  Right AIMS (if indicated):    Assets:  Communication Skills Desire for Improvement Leisure Time Physical Health Sleep:     Musculoskeletal: Strength & Muscle Tone: within normal limits Gait & Station: normal Patient leans: N/A  Blood pressure 135/85, pulse 70, temperature 98.2 F (36.8 C), temperature source Oral, resp. rate 20, SpO2 99 %.  Recommendations: Based on my evaluation the patient does not appear to have an emergency medical condition.   Patient has a history of diabetes mellitus, heart disease, MI, HTN. He has not been compliant with medications. Recommend inpatient treatment after medical clearance.   Rozetta Nunnery, NP 12/10/2019, 12:21 AM

## 2019-12-10 NOTE — [facility] Group Notes (Signed)
[facility] LCSW Group Therapy Note  12/10/2019    Type of Therapy and Topic:  Group Therapy:  Adding Supports Including Yourself  Participation Level:  Did Not Attend   Description of Group:   Patients in this group were introduced to the concept that additional supports including self-support are an essential part of recovery.  Patients listed what supports they believe they need to add to their lives to achieve their goals at discharge, and they listed such things as therapist, family, doctor, support groups, 12-step groups and service animals.   A song entitled "My Own Hero" was played and a group discussion ensued in which patients stated they could relate to the song and it inspired them to realize they have be willing to help themselves in order to succeed, because other people cannot achieve sobriety or stability for them.  "Fight For It" was played, then "I Am Enough" to encourage patients.  They discussed the impact on them and how they must remain convinced that their lives are worth the effort it takes to become sober and/or stable.  Therapeutic Goals: 1)  demonstrate the importance of being a key part of one's own support system 2)  discuss various available supports 3)  encourage patient to use music as part of their self-support and focus on goals 4)  elicit ideas from patients about supports that need to be added   Summary of Patient Progress:  The patient was invited to group but did not attend  Therapeutic Modalities:   Motivational Interviewing Activity  Berlin Hun Grossman-Orr  3:02 PM

## 2019-12-11 MED ORDER — METOPROLOL TARTRATE 50 MG PO TABS: 50.0000 mg | ORAL_TABLET | Freq: Two times a day (BID) | ORAL | Status: DC

## 2019-12-11 MED ORDER — CARVEDILOL 3.125 MG PO TABS
3.1250 mg | ORAL_TABLET | Freq: Two times a day (BID) | ORAL | Status: DC
  Administered 2019-12-11 – 2019-12-12 (×2): 3.125 mg via ORAL
  Filled 2019-12-11 (×5): qty 1

## 2019-12-11 MED ORDER — MAGNESIUM HYDROXIDE 400 MG/5ML PO SUSP
30.0000 mL | Freq: Every day | ORAL | Status: DC | PRN
  Administered 2019-12-11: 30 mL via ORAL

## 2019-12-11 NOTE — Progress Notes (Signed)
Recreation Therapy Notes  Date: 6.14.21 Time: 0930 Location: [address]  Group Topic: Wellness  Goal Area(s) Addresses:  Patient will define components of whole wellness. Patient will verbalize benefit of whole wellness.  Intervention: Stress Management  Activity: Guided Imagery.  LRT read a script that took patients on a journey through a peaceful meadow to enjoy the sights and sounds of nature.  Education: Wellness, Dentist.   Education Outcome: Acknowledges education/In group clarification offered/Needs additional education.   Clinical Observations/Feedback: Pt did not attend group activity.     [name], LRT/CTRS         [name] A 12/11/2019 11:35 AM

## 2019-12-11 NOTE — Tx Team (Cosign Needed)
Interdisciplinary Treatment and Diagnostic Plan Update  12/11/2019 Time of Session: 9:00am [name] MRN: [id number]  Principal Diagnosis: <principal problem not specified>  Secondary Diagnoses: Active Problems:   Severe 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 features ([name])   Current Medications:  Current Facility-Administered Medications  Medication Dose Route Frequency Provider Last Rate Last Admin  . acetaminophen (TYLENOL) tablet 650 mg  650 mg Oral Q6H PRN Lindon Romp A, NP   650 mg at 12/11/19 7867  . amLODipine (NORVASC) tablet 10 mg  10 mg Oral Daily Lindon Romp A, NP   10 mg at 12/11/19 6720  . aspirin EC tablet 81 mg  81 mg Oral Daily Cobos, Myer Peer, MD   81 mg at 12/11/19 0850  . carvedilol (COREG) tablet 3.125 mg  3.125 mg Oral BID WC Cobos, Fernando A, MD      . isosorbide mononitrate (IMDUR) 24 hr tablet 90 mg  90 mg Oral Daily Lindon Romp A, NP   90 mg at 12/11/19 0850  . LORazepam (ATIVAN) tablet 1 mg  1 mg Oral Q6H PRN Lindon Romp A, NP      . metFORMIN (GLUCOPHAGE) tablet 500 mg  500 mg Oral BID WC Cobos, Myer Peer, MD   500 mg at 12/11/19 0851  . mirtazapine (REMERON) tablet 15 mg  15 mg Oral QHS Cobos, Myer Peer, MD   15 mg at 12/10/19 2135  . multivitamin with minerals tablet 1 tablet  1 tablet Oral Daily Lindon Romp A, NP   1 tablet at 12/11/19 0850  . nitroGLYCERIN (NITROSTAT) SL tablet 0.4 mg  0.4 mg Sublingual Q5 Min x 3 PRN Lindon Romp A, NP      . thiamine tablet 100 mg  100 mg Oral Daily Lindon Romp A, NP   100 mg at 12/11/19 0851  . ticagrelor (BRILINTA) tablet 90 mg  90 mg Oral BID Lindon Romp A, NP   90 mg at 12/11/19 9470   PTA Medications: Medications Prior to Admission  Medication Sig Dispense Refill Last Dose  . amLODipine (NORVASC) 10 MG tablet Take 1 tablet (10 mg total) by mouth daily. 90 tablet 2   . ARIPiprazole (ABILIFY) 2 MG tablet Take 1 tablet (2 mg total) by mouth daily. (Patient not taking: Reported on 12/09/2019) 90  tablet 2   . aspirin 81 MG chewable tablet Chew 1 tablet (81 mg total) by mouth daily. 90 tablet 2   . co-enzyme Q-10 30 MG capsule Take 30 mg by mouth daily.     . famotidine (PEPCID) 20 MG tablet Take 1 tablet (20 mg total) by mouth 2 (two) times daily. (Patient not taking: Reported on 12/09/2019) 180 tablet 2   . fluticasone (FLONASE) 50 MCG/ACT nasal spray Place 1 spray into both nostrils daily as needed for allergies or rhinitis.     Marland Kitchen isosorbide mononitrate (IMDUR) 60 MG 24 hr tablet Take 1.5 tablets (90 mg total) by mouth daily. 135 tablet 2   . LIVALO 4 MG TABS TAKE 1 TABLET BY MOUTH ONCE DAILY IN THE MORNING (Patient taking differently: Take 4 mg by mouth daily. ) 90 tablet 2   . metFORMIN (GLUCOPHAGE) 500 MG tablet TAKE 1 TABLET BY MOUTH TWICE DAILY WITH A MEAL (Patient taking differently: Take 500 mg by mouth 2 (two) times daily with a meal. ) 180 tablet 2   . metoprolol tartrate (LOPRESSOR) 100 MG tablet Take 1 tablet (100 mg total) by mouth 2 (two) times daily. (Patient not taking:  Reported on 12/09/2019) 180 tablet 2   . mirtazapine (REMERON) 15 MG tablet Take 1 tablet (15 mg total) by mouth at bedtime. (Patient not taking: Reported on 12/09/2019) 90 tablet 2   . nitroGLYCERIN (NITROSTAT) 0.4 MG SL tablet Place 1 tablet (0.4 mg total) under the tongue every 5 (five) minutes x 3 doses as needed for chest pain. 25 tablet 12   . ranolazine (RANEXA) 500 MG 12 hr tablet Take 1 tablet (500 mg total) by mouth 2 (two) times daily. (Patient not taking: Reported on 12/09/2019) 180 tablet 2   . ticagrelor (BRILINTA) 90 MG TABS tablet Take 1 tablet (90 mg total) by mouth 2 (two) times daily. 180 tablet 2   . vortioxetine HBr (TRINTELLIX) 10 MG TABS tablet Take 1 tablet (10 mg total) by mouth daily. (Patient not taking: Reported on 09/28/2019) 90 tablet 1     Patient Stressors: Health problems Loss of wife, mother, brother, and nephew 2 years ago Medication change or noncompliance Substance  abuse  Patient Strengths: Ability for insight Average or above average intelligence Capable of independent living Agricultural engineer for treatment/growth Supportive family/friends  Treatment Modalities: Medication Management, Group therapy, Case management,  1 to 1 session with clinician, Psychoeducation, Recreational therapy.   Physician Treatment Plan for Primary Diagnosis: <principal problem not specified> Long Term Goal(s): Improvement in symptoms so as ready for discharge Improvement in symptoms so as ready for discharge   Short Term Goals: Ability to identify changes in lifestyle to reduce recurrence of condition will improve Ability to verbalize feelings will improve Ability to disclose and discuss suicidal ideas Ability to demonstrate self-control will improve Ability to identify and develop effective coping behaviors will improve Ability to maintain clinical measurements within normal limits will improve Compliance with prescribed medications will improve Ability to identify changes in lifestyle to reduce recurrence of condition will improve Ability to identify triggers associated with substance abuse/mental health issues will improve  Medication Management: Evaluate patient's response, side effects, and tolerance of medication regimen.  Therapeutic Interventions: 1 to 1 sessions, Unit Group sessions and Medication administration.  Evaluation of Outcomes: Not Met  Physician Treatment Plan for Secondary Diagnosis: Active Problems:   Severe 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 features ([name])  Long Term Goal(s): Improvement in symptoms so as ready for discharge Improvement in symptoms so as ready for discharge   Short Term Goals: Ability to identify changes in lifestyle to reduce recurrence of condition will improve Ability to verbalize feelings will improve Ability to disclose and discuss suicidal ideas Ability to demonstrate self-control will  improve Ability to identify and develop effective coping behaviors will improve Ability to maintain clinical measurements within normal limits will improve Compliance with prescribed medications will improve Ability to identify changes in lifestyle to reduce recurrence of condition will improve Ability to identify triggers associated with substance abuse/mental health issues will improve     Medication Management: Evaluate patient's response, side effects, and tolerance of medication regimen.  Therapeutic Interventions: 1 to 1 sessions, Unit Group sessions and Medication administration.  Evaluation of Outcomes: Not Met   RN Treatment Plan for Primary Diagnosis: <principal problem not specified> Long Term Goal(s): Knowledge of disease and therapeutic regimen to maintain health will improve  Short Term Goals: Ability to disclose and discuss suicidal ideas, Ability to identify and develop effective coping behaviors will improve and Compliance with prescribed medications will improve  Medication Management: RN will administer medications as ordered by provider, will assess and evaluate patient's response  and provide education to patient for prescribed medication. RN will report any adverse and/or side effects to prescribing provider.  Therapeutic Interventions: 1 on 1 counseling sessions, Psychoeducation, Medication administration, Evaluate responses to treatment, Monitor vital signs and CBGs as ordered, Perform/monitor CIWA, COWS, AIMS and Fall Risk screenings as ordered, Perform wound care treatments as ordered.  Evaluation of Outcomes: Not Met   LCSW Treatment Plan for Primary Diagnosis: <principal problem not specified> Long Term Goal(s): Safe transition to appropriate next level of care at discharge, Engage patient in therapeutic group addressing interpersonal concerns.  Short Term Goals: Engage patient in aftercare planning with referrals and resources, Increase social support,  Identify triggers associated with mental health/substance abuse issues and Increase skills for wellness and recovery  Therapeutic Interventions: Assess for all discharge needs, 1 to 1 time with Social worker, Explore available resources and support systems, Assess for adequacy in community support network, Educate family and significant other(s) on suicide prevention, Complete Psychosocial Assessment, Interpersonal group therapy.  Evaluation of Outcomes: Not Met  Progress in Treatment: Attending groups: No. New to unit.  Participating in groups: No. Taking medication as prescribed: Yes. Toleration medication: Yes. Family/Significant other contact made: No, will contact:  supports if consents are granted. Patient understands diagnosis: Yes. Discussing patient identified problems/goals with staff: Yes. Medical problems stabilized or resolved: No. Poorly controlled diabetes Denies suicidal/homicidal ideation: No. Issues/concerns per patient self-inventory: Yes.  New problem(s) identified: Yes, Describe:  medical issues   New Short Term/Long Term Goal(s): medication management for mood stabilization; elimination of SI thoughts; development of comprehensive mental wellness/sobriety plan.  Patient Goals:    Discharge Plan or Barriers: Patient recently admitted to unit, CSW assessing for appropriate referrals.  Reason for Continuation of Hospitalization: Anxiety Depression Medical Issues Medication stabilization Suicidal ideation  Estimated Length of Stay: 5-7 days  Attendees: Patient: 12/11/2019 10:11 AM  Physician:  12/11/2019 10:11 AM  Nursing:  12/11/2019 10:11 AM  RN Care Manager: 12/11/2019 10:11 AM  Social Worker: Stephanie Acre, [place] 12/11/2019 10:11 AM  Recreational Therapist:  12/11/2019 10:11 AM  Other:  12/11/2019 10:11 AM  Other:  12/11/2019 10:11 AM  Other: 12/11/2019 10:11 AM    Scribe for Treatment Team: Joellen Jersey, [name] 12/11/2019 10:11 AM

## 2019-12-11 NOTE — Progress Notes (Addendum)
[facility] MD Progress Note  12/11/2019 9:35 AM [name]  MRN:  [id number] Subjective:  Reports he is still feeling depressed. Denies suicidal ideations. Denies medication side effects at this time. Objective : I have discussed case with treatment team and have met with patient. 59 year old male, presented to ED voluntarily, reporting worsening depression and suicidal ideations.  Also reported intermittent homicidal ideations towards a man whom he states owes money.  Reports a history of recurrent depression, alcohol use disorder and to a lesser degree past history of cocaine abuse as well.  He reports he had been doing well, without depression and functioning well mood activities up to several months ago when he stopped his psychiatric medications due to concerns that they were contributing to bruxism.  Since then he reports a gradual worsening of his mood and currently endorses depression, passive SI, reports he has been neglecting his diet and not taking his medications regularly, "not caring if I live or die".  He relapsed on alcohol and cocaine x2 days earlier this week following a period of almost a year sobriety.  He has a medical history of hypertension, coronary artery disease, history of angioplasty/stents, diabetes mellitus. He reports he was taking his medications irregularly leading up to admission.  Today patient presents alert, attentive, calm, polite on approach. Describes ongoing depression, denies suicidal ideations at this time and contracts for safety on unit . No psychotic symptoms reported or noted. Behavior on unit calm and in good control, no disruptive or agitated behaviors. Denies medication side effects .    Principal Problem:   Diagnosis: Active Problems:   Severe 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 features ([place])  Total Time spent with patient: 20 minutes  Past Psychiatric History:   Past Medical History:  Past Medical History:  Diagnosis Date  .  Allergies   . Arthritis   . Chest pain   . Chronic lower back pain   . Chronic pain of right wrist   . Coronary artery disease    a. Multiple prior caths/PCI. Cath 2013 with possible spasm of RCA, 70% ISR of mid LCx with subsequent DES to mLCx and prox LCX. b. H/o microvascular angina. c. Recurrent angina 08/2014 - s/p PTCA/DES to prox Cx, PTCA/CBA to OM1.  c. LHC 06/10/15 with patent stents and some ISR in LCX and OM-1 that was not flow limiting --> Rx   . Dyslipidemia    a. Intolerant to many statins except tolerating Livalo.  Marland Kitchen GERD (gastroesophageal reflux disease)   . H/O cardiac catheterization 10/25/2018  . Hypertension   . Myocardial infarction ([place]) ~ 2010  . S/P angioplasty with stent, DES, to proximal and mid LCX 12/15/11 12/15/2011  . Shoulder pain   . Stroke [facility])    pt. reports had a stroke around time of MI 2010  . Type II diabetes mellitus ([place])   . Unstable angina [facility])     Past Surgical History:  Procedure Laterality Date  . CARDIAC CATHETERIZATION  06/15/2002   LAD with prox 40% stenosis, norma L main, Cfx with 25% lesion, RCA with long mid 25% stenosis (Dr. Vita Barley)  . CARDIAC CATHETERIZATION  04/01/2010   normal L main, LAD wit mild stenosis, L Cfx with 70% in-stent restenosis, RCA with 70% in-stent restenosis, LVEF >60% (Dr. K. Mali Hilty) - cutting ballon arthrectomy to RCA & Cfx (Dr. Rockne Menghini)  . CARDIAC CATHETERIZATION  08/25/2010   preserved global LV contractility; multivessel CAD, diffuse 90-95% in-stent restenosis in prox placed  Cfx stent - cutting balloon arthrectomy in Cfx with multiple dilatations 90-95% to 0% stenosis (Dr. Corky Downs)  . CARDIAC CATHETERIZATION  01/26/2011   PCI & stenting of aggresive in-stent restenosis within previously stented AV groove Cfx with 3.0x71m Taxus DES (previous stents were Promus) (Dr. JAdora Fridge  . CARDIAC CATHETERIZATION  05/11/2011   preserved LV function, 40% mid LAD stenosis, 30-40% narrowing proximal to stented  semgnet of prox Cfx, patent mid RCA stent with smooth 20% narrowing in distal RCA (Dr. TCorky Downs  . CARDIAC CATHETERIZATION  12/15/2011   PCI & stenting of proximal & mid Cfx with DES - 3.0x179min proximal, 3.0x1579mn mid (Dr. J. Adora Fridge. CARDIAC CATHETERIZATION N/A 06/10/2015   Procedure: Left Heart Cath and Coronary Angiography;  Surgeon: DanJolaine ArtistD;  Location: [facility] [place] [facility];  Service: Cardiovascular;  Laterality: N/A;  . CARDIAC CATHETERIZATION  10/25/2018  . cardiometabolic testing  08/16/52/6270good exercise effort, peak VO2 79% predicted with normal VO2 HR curves (mild deconditioning)  . COLONOSCOPY  12/2012   diminutive hyperplastic sigmoid poyp so repeat routine 2024  . CORONARY BALLOON ANGIOPLASTY N/A 10/25/2018   Procedure: CORONARY BALLOON ANGIOPLASTY;  Surgeon: VarJettie BoozeD;  Location: [facility] [place] [facility];  Service: Cardiovascular;  Laterality: N/A;  . CORONARY BALLOON ANGIOPLASTY N/A 09/29/2019   Procedure: CORONARY BALLOON ANGIOPLASTY;  Surgeon: EndNelva BushD;  Location: [facility] [place] [facility];  Service: Cardiovascular;  Laterality: N/A;  . EXCISIONAL HEMORRHOIDECTOMY  1984  . INTRAVASCULAR ULTRASOUND/IVUS N/A 09/29/2019   Procedure: Intravascular Ultrasound/IVUS;  Surgeon: EndNelva BushD;  Location: [facility] [place] [facility];  Service: Cardiovascular;  Laterality: N/A;  . LEFT HEART CATH AND CORONARY ANGIOGRAPHY N/A 10/25/2018   Procedure: LEFT HEART CATH AND CORONARY ANGIOGRAPHY;  Surgeon: VarJettie BoozeD;  Location: [facility] [place] [facility];  Service: Cardiovascular;  Laterality: N/A;  . LEFT HEART CATH AND CORONARY ANGIOGRAPHY N/A 09/29/2019   Procedure: LEFT HEART CATH AND CORONARY ANGIOGRAPHY;  Surgeon: EndNelva BushD;  Location: [facility] [place] [facility];  Service: Cardiovascular;  Laterality: N/A;  . LEFT HEART CATHETERIZATION WITH CORONARY ANGIOGRAM N/A 05/11/2011   Procedure: LEFT HEART CATHETERIZATION WITH CORONARY ANGIOGRAM;  Surgeon: ThoTroy SineD;  [facility];  Service: Cardiovascular;  Laterality: N/A;  Possible percutaneous coronary intervention, possible IVUS  . LEFT HEART CATHETERIZATION WITH CORONARY ANGIOGRAM N/A 12/15/2011   Procedure: LEFT HEART CATHETERIZATION WITH CORONARY ANGIOGRAM;  Surgeon: JonLorretta HarpD;  [facility];  Service: Cardiovascular;  Laterality: N/A;  . LEFT HEART CATHETERIZATION WITH CORONARY ANGIOGRAM N/A 09/05/2014   Procedure: LEFT HEART CATHETERIZATION WITH CORONARY ANGIOGRAM;  Surgeon: ChrBurnell BlanksD;  [facility];  Service: Cardiovascular;  Laterality: N/A;  . LIPOMA EXCISION     back of the head  . NM MYOCAR PERF WALL MOTION  02/2012   lexiscan myoview; mild perfusion defect in mid inferolateral & basal inferolateral region (infarct/scar); EF 52%, abnormal but ow risk scan  . PERCUTANEOUS CORONARY STENT INTERVENTION (PCI-S)  09/05/2014   Procedure: PERCUTANEOUS CORONARY STENT INTERVENTION (PCI-S);  Surgeon: ChrBurnell BlanksD;  [facility];  Service: Cardiovascular;;   Family History:  Family History  Problem Relation Age of Onset  . Leukemia Mother   . Prostate cancer Father   . Coronary artery disease Paternal Grandmother   . Cancer Paternal Grandfather   . Cancer Brother    Family Psychiatric  History:  Social History:  Social History   Substance and Sexual Activity  Alcohol Use Yes   Comment: 2 bottles of wine on 12/08/2019 (one occurrence)     Social History   Substance and Sexual Activity  Drug Use Yes  . Types: Cocaine   Comment: can't specify how much was used on 12/08/2019, "not much"    Social History   Socioeconomic History  . Marital status: Widowed    Spouse name: Not on file  . Number of children: 2  . Years of education: GED  . Highest education level: Not on file  Occupational History  . Not on file  Tobacco Use  . Smoking status: Former Smoker    Packs/day: 1.00    Years: 10.00    Pack  years: 10.00    Types: Cigarettes    Quit date: 10/07/2018    Years since quitting: 1.1  . Smokeless tobacco: Never Used  Vaping Use  . Vaping Use: Never used  Substance and Sexual Activity  . Alcohol use: Yes    Comment: 2 bottles of wine on 12/08/2019 (one occurrence)  . Drug use: Yes    Types: Cocaine    Comment: can't specify how much was used on 12/08/2019, "not much"  . Sexual activity: Not Currently  Other Topics Concern  . Not on file  Social History Narrative  . Not on file   Social Determinants of Health   Financial Resource Strain:   . Difficulty of Paying Living Expenses:   Food Insecurity:   . Worried About Charity fundraiser in the Last Year:   . Arboriculturist in the Last Year:   Transportation Needs:   . Film/video editor (Medical):   Marland Kitchen Lack of Transportation (Non-Medical):   Physical Activity:   . Days of Exercise per Week:   . Minutes of Exercise per Session:   Stress:   . Feeling of Stress :   Social Connections:   . Frequency of Communication with Friends and Family:   . Frequency of Social Gatherings with Friends and Family:   . Attends Religious Services:   . Active Member of Clubs or Organizations:   . Attends Archivist Meetings:   Marland Kitchen Marital Status:    Additional Social History:   Sleep: fair/improving   Appetite:  Fair  Current Medications: Current Facility-Administered Medications  Medication Dose Route Frequency Provider Last Rate Last Admin  . acetaminophen (TYLENOL) tablet 650 mg  650 mg Oral Q6H PRN Lindon Romp A, NP   650 mg at 12/11/19 3500  . amLODipine (NORVASC) tablet 10 mg  10 mg Oral Daily Lindon Romp A, NP   10 mg at 12/11/19 9381  . aspirin EC tablet 81 mg  81 mg Oral Daily Krislynn Gronau, Myer Peer, MD   81 mg at 12/11/19 0850  . isosorbide mononitrate (IMDUR) 24 hr tablet 90 mg  90 mg Oral Daily Lindon Romp A, NP   90 mg at 12/11/19 0850  . LORazepam (ATIVAN) tablet 1 mg  1 mg Oral Q6H PRN Lindon Romp A, NP       . metFORMIN (GLUCOPHAGE) tablet 500 mg  500 mg Oral BID WC Sylvain Hasten, Myer Peer, MD   500 mg at 12/11/19 0851  . mirtazapine (REMERON) tablet 15 mg  15 mg Oral QHS Dermot Gremillion, Myer Peer, MD   15 mg at 12/10/19 2135  . multivitamin with minerals tablet 1 tablet  1 tablet Oral Daily Rozetta Nunnery, NP  1 tablet at 12/11/19 0850  . nitroGLYCERIN (NITROSTAT) SL tablet 0.4 mg  0.4 mg Sublingual Q5 Min x 3 PRN Lindon Romp A, NP      . thiamine tablet 100 mg  100 mg Oral Daily Lindon Romp A, NP   100 mg at 12/11/19 0851  . ticagrelor (BRILINTA) tablet 90 mg  90 mg Oral BID Rozetta Nunnery, NP   90 mg at 12/11/19 1308    Lab Results:  Results for orders placed or performed during the hospital encounter of 12/10/19 (from the past 48 hour(s))  Lipid panel     Status: None   Collection Time: 12/10/19  6:30 AM  Result Value Ref Range   Cholesterol 142 0 - 200 mg/dL   Triglycerides 146 <150 mg/dL   HDL 41 >40 mg/dL   Total CHOL/HDL Ratio 3.5 RATIO   VLDL 29 0 - 40 mg/dL   LDL Cholesterol 72 0 - 99 mg/dL    Comment:        Total Cholesterol/HDL:CHD Risk Coronary Heart Disease Risk Table                     Men   Women  1/2 Average Risk   3.4   3.3  Average Risk       5.0   4.4  2 X Average Risk   9.6   7.1  3 X Average Risk  23.4   11.0        Use the calculated Patient Ratio above and the CHD Risk Table to determine the patient's CHD Risk.        ATP III CLASSIFICATION (LDL):  <100     mg/dL   Optimal  100-129  mg/dL   Near or Above                    Optimal  130-159  mg/dL   Borderline  160-189  mg/dL   High  >190     mg/dL   Very High Performed at [facility] [address] [place], [place] [address]   TSH     Status: None   Collection Time: 12/10/19  6:30 AM  Result Value Ref Range   TSH 2.723 0.350 - 4.500 uIU/mL    Comment: Performed by a 3rd Generation assay with a functional sensitivity of <=0.01 uIU/mL. Performed at [facility], [place]  [address], [place], [place] [address]     Blood Alcohol level:  Lab Results  Component Value Date   ETH <10 12/09/2019   ETH <10 52/84/1324    Metabolic Disorder Labs: Lab Results  Component Value Date   HGBA1C 6.2 (H) 09/07/2019   MPG 131.24 09/07/2019   MPG 142.72 10/25/2018   No results found for: PROLACTIN Lab Results  Component Value Date   CHOL 142 12/10/2019   TRIG 146 12/10/2019   HDL 41 12/10/2019   CHOLHDL 3.5 12/10/2019   VLDL 29 12/10/2019   LDLCALC 72 12/10/2019   LDLCALC 110 (H) 09/08/2019    Physical Findings: AIMS:  , ,  ,  ,    CIWA:  CIWA-Ar Total: 2 COWS:     Musculoskeletal: Strength & Muscle Tone: within normal limits no tremors, no diaphoresis, no restlessness or agitation .  Gait & Station: normal Patient leans: N/A  Psychiatric Specialty Exam: Physical Exam  Review of Systems denies headache, denies chest pain, no shortness of breath, no vomiting   Blood  pressure (!) 155/113, pulse 89, temperature 98.2 F (36.8 C), temperature source Oral, resp. rate 20, height 5' 8"  (1.727 m), weight 86.6 kg, SpO2 98 %.Body mass index is 29.04 kg/m.  General Appearance: Fairly Groomed  Eye Contact:  Good  Speech:  Normal Rate  Volume:  Decreased  Mood:  reports some improvement but still depressed   Affect:  constricted, does smile appropriately at times during session  Thought Process:  Linear and Descriptions of Associations: Intact  Orientation:  Full (Time, Place, and Person)  Thought Content:  currently no hallucinations,no delusions   Suicidal Thoughts:  No denies suicidal or self injurious ideations at this time  Homicidal Thoughts:  Intermittent HI towards a man he states owes him  money.   Memory:  recent and remote grossly intact  Judgement:  Fair  Insight:  Fair  Psychomotor Activity:  Normal- no restlessness or agitation  Concentration:  Concentration: Good and Attention Span: Good  Recall:  Good  Fund of Knowledge:  Good  Language:   Good  Akathisia:  Negative  Handed:  Right  AIMS (if indicated):     Assets:  Communication Skills Desire for Improvement Resilience  ADL's:  Intact  Cognition:  WNL  Sleep:  Number of Hours: 6.75   Assessment 59 year old male, presented to ED voluntarily, reporting worsening depression and suicidal ideations.  Also reported intermittent homicidal ideations towards a man whom he states owes money.  Reports a history of recurrent depression, alcohol use disorder and to a lesser degree past history of cocaine abuse as well.  He reports he had been doing well, without depression and functioning well mood activities up to several months ago when he stopped his psychiatric medications due to concerns that they were contributing to bruxism.  Since then he reports a gradual worsening of his mood and currently endorses depression, passive SI, reports he has been neglecting his diet and not taking his medications regularly, "not caring if I live or die".  He relapsed on alcohol and cocaine x2 days earlier this week following a period of almost a year sobriety.  He has a medical history of hypertension, coronary artery disease, history of angioplasty/stents, diabetes mellitus. He reports he was taking his medications irregularly leading up to admission.  Today patient reports some improvement compared to admission but remains vaguely depressed/constricted in affect. Denies SI. Has endorsed HI towards a man that owes him money but today does not appear focused on this issue nor does he endorse intention at this time. He is tolerating medications well  ( Remeron)  BP has been elevated , today 155/113. No headache or chest pain at this time. I have reviewed with Hospitalist consultant : We reviewed home and current medication regimen. He has been restarted on his home anti-hypertnsive medications- Amlodipine and Isosorbide( Imdur), which he had not been taking regularly recently . Cocaine Use Disorder and last  day of use ( 6/11) reviewed . Recommendation is to add Coreg 3.125 mgrs BID.    Treatment Plan Summary: Daily contact with patient to assess and evaluate symptoms and progress in treatment, Medication management, Plan inpatient treatment and medications as below Encourage group and milieu participation  Encourage efforts to work on sobriety and relapse prevention Treatment team working on disposition planning options Continue Remeron 15 mgrs QHS for depression Continue Ativan 1 mgr Q 6 hours PRN for alcohol WDL if neeed  Continue Thiamine and MVI supplementation Continue Imdur 90 mgr QDAY, Norvasc 10 mgrs QDAY, and add  Coreg 3.125 mgrs BID for HTN/CAD Continue Brilinta 90 mgrs BID, ASA 81 mgrs QDAY for CAD/ history of angioplasty/stents  Continue Glucophage 500 mgrs BID for DM Jenne Campus, MD 12/11/2019, 9:35 AM

## 2019-12-11 NOTE — Progress Notes (Signed)
NP, Lindon Romp notified of pt's elevated blood pressure this morning (155/113). Received permission to go ahead and administer scheduled norvasc.

## 2019-12-11 NOTE — [facility] Group Notes (Signed)
LCSW Group Therapy Note 12/11/2019 3:19 PM  Type of Therapy and Topic: Group Therapy: Overcoming Obstacles  Participation Level: Did Not Attend  Description of Group:  In this group patients will be encouraged to explore what they see as obstacles to their own wellness and recovery. They will be guided to discuss their thoughts, feelings, and behaviors related to these obstacles. The group will process together ways to cope with barriers, with attention given to specific choices patients can make. Each patient will be challenged to identify changes they are motivated to make in order to overcome their obstacles. This group will be process-oriented, with patients participating in exploration of their own experiences as well as giving and receiving support and challenge from other group members.  Therapeutic Goals: 1. Patient will identify personal and current obstacles as they relate to admission. 2. Patient will identify barriers that currently interfere with their wellness or overcoming obstacles.  3. Patient will identify feelings, thought process and behaviors related to these barriers. 4. Patient will identify two changes they are willing to make to overcome these obstacles:   Summary of Patient Progress  Invited, chose not to attend.     Therapeutic Modalities:  Cognitive Behavioral Therapy Solution Focused Therapy Motivational Interviewing Relapse Prevention Therapy   Theresa Duty Clinical Social Worker

## 2019-12-11 NOTE — Progress Notes (Signed)
   12/10/19 1935  COVID-19 Daily Checkoff  Have you had a fever (temp > 37.80C/100F)  in the past 24 hours?  No  COVID-19 EXPOSURE  Have you traveled outside the state in the past 14 days? No  Have you been in contact with someone with a confirmed diagnosis of COVID-19 or PUI in the past 14 days without wearing appropriate PPE? No  Have you been living in the same home as a person with confirmed diagnosis of COVID-19 or a PUI (household contact)? No  Have you been diagnosed with COVID-19? No

## 2019-12-11 NOTE — Progress Notes (Signed)
   12/10/19 1935  Psych Admission Type (Psych Patients Only)  Admission Status Voluntary  Psychosocial Assessment  Patient Complaints Depression;Sadness;Sleep disturbance  Eye Contact Brief  Facial Expression Sad;Anxious;Flat  Affect Anxious;Appropriate to circumstance;Depressed;Sad  Speech Logical/coherent;Soft  Interaction Minimal;Isolative  Motor Activity Slow  Appearance/Hygiene Layered clothes;In scrubs;Unremarkable  Behavior Characteristics Cooperative;Appropriate to situation;Calm;Anxious  Mood Depressed;Anxious;Sad  Thought Process  Coherency WDL  Content WDL  Delusions None reported or observed  Perception WDL  Hallucination None reported or observed  Judgment Impaired  Confusion None  Danger to Self  Current suicidal ideation? Passive  Self-Injurious Behavior No self-injurious ideation or behavior indicators observed or expressed   Agreement Not to Harm Self Yes  Description of Agreement verbally contracts for safety  Danger to Others  Danger to Others Reported or observed  Danger to Others Abnormal  Harmful Behavior to others No threats or harm toward other people  Destructive Behavior No threats or harm toward property

## 2019-12-12 LAB — HEMOGLOBIN A1C
Hgb A1c MFr Bld: 6.9 % — ABNORMAL HIGH (ref 4.8–5.6)
Mean Plasma Glucose: 151.33 mg/dL

## 2019-12-12 LAB — GLUCOSE, CAPILLARY: Glucose-Capillary: 119 mg/dL — ABNORMAL HIGH (ref 70–99)

## 2019-12-12 MED ORDER — BISACODYL 5 MG PO TBEC
5.0000 mg | DELAYED_RELEASE_TABLET | Freq: Every day | ORAL | Status: DC | PRN
  Administered 2019-12-12: 5 mg via ORAL
  Filled 2019-12-12: qty 1

## 2019-12-12 MED ORDER — ISOSORBIDE MONONITRATE ER 60 MG PO TB24
90.0000 mg | ORAL_TABLET | Freq: Every day | ORAL | Status: DC
  Administered 2019-12-13: 90 mg via ORAL
  Filled 2019-12-12 (×4): qty 1

## 2019-12-12 MED ORDER — LISINOPRIL 10 MG PO TABS
10.0000 mg | ORAL_TABLET | Freq: Every day | ORAL | Status: DC
  Administered 2019-12-13: 10 mg via ORAL
  Filled 2019-12-12: qty 1
  Filled 2019-12-12: qty 2
  Filled 2019-12-12 (×3): qty 1

## 2019-12-12 NOTE — Progress Notes (Signed)
Psychoeducational Group Note  Date:  12/12/2019 Time:  2139 Group Topic/Focus:   Wrap-Up Group:   The focus of this group is to help patients review their daily goal of treatment and discuss progress on daily workbooks.  Participation Level: Did Not Attend  Participation Quality:  Not Applicable  Affect:  Not Applicable  Cognitive:  Not Applicable  Insight:  Not Applicable  Engagement in Group: Not Applicable  Additional Comments:  The patient did not attend group this evening.   Archie Balboa S 12/12/2019, 9:39 PM

## 2019-12-12 NOTE — Progress Notes (Signed)
   12/11/19 2348  Psych Admission Type (Psych Patients Only)  Admission Status Voluntary  Psychosocial Assessment  Patient Complaints Anxiety  Eye Contact Brief  Facial Expression Sad;Anxious;Flat  Affect Anxious;Appropriate to circumstance;Depressed;Sad  Speech Logical/coherent;Soft  Interaction Minimal;Isolative  Motor Activity Slow  Appearance/Hygiene Layered clothes;In scrubs;Unremarkable  Behavior Characteristics Appropriate to situation  Mood Pleasant  Thought Process  Coherency WDL  Content WDL  Delusions None reported or observed  Perception WDL  Hallucination None reported or observed  Judgment Impaired  Confusion None  Danger to Self  Current suicidal ideation? Denies  Self-Injurious Behavior No self-injurious ideation or behavior indicators observed or expressed   Agreement Not to Harm Self Yes  Description of Agreement verbally contracts for safety  Danger to Others  Danger to Others Reported or observed  Danger to Others Abnormal  Harmful Behavior to others No threats or harm toward other people  Destructive Behavior No threats or harm toward property

## 2019-12-12 NOTE — Progress Notes (Signed)
   12/12/19 2258  Psych Admission Type (Psych Patients Only)  Admission Status Voluntary  Psychosocial Assessment  Patient Complaints Anxiety  Eye Contact Brief  Facial Expression Sad;Anxious;Flat  Affect Anxious;Appropriate to circumstance;Depressed;Sad  Speech Logical/coherent;Soft  Interaction Minimal;Isolative  Motor Activity Slow  Appearance/Hygiene Layered clothes;In scrubs;Unremarkable  Behavior Characteristics Cooperative  Mood Pleasant  Thought Process  Coherency WDL  Content WDL  Delusions None reported or observed  Perception WDL  Hallucination None reported or observed  Judgment Impaired  Confusion None  Danger to Self  Current suicidal ideation? Denies  Self-Injurious Behavior No self-injurious ideation or behavior indicators observed or expressed   Agreement Not to Harm Self Yes  Description of Agreement verbally contracts for safety  Danger to Others  Danger to Others Reported or observed  Danger to Others Abnormal  Harmful Behavior to others No threats or harm toward other people  Destructive Behavior No threats or harm toward property  D: Patient in dayroom reports he had a good day.  A: Medications administered as prescribed. Support and encouragement provided as needed.  R: Patient remains safe on the unit. Will continue to monitor for safety and stability.
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
## 2019-12-12 NOTE — Progress Notes (Signed)
Recreation Therapy Notes  Animal-Assisted Activity (AAA) Program Checklist/Progress Notes Patient Eligibility Criteria Checklist & Daily Group note for Rec Tx Intervention  Date: 6.15.21 Time: 80 Location: [address]   AAA/T Program Assumption of Risk Form signed by Teacher, music or Parent Legal Guardian  YES  Patient is free of allergies or sever asthma  YES  Patient reports no fear of animals  YES  Patient reports no history of cruelty to animals YES  Patient understands his/her participation is voluntary YES  Patient washes hands before animal contact  YES  Patient washes hands after animal contact  YES  Behavioral Response: Engaged  Education: Contractor, Appropriate Animal Interaction   Education Outcome: Acknowledges understanding/In group clarification offered/Needs additional education.   Clinical Observations/Feedback: Pt attended and participated in activity.    [name], LRT/CTRS         [name] A 12/12/2019 3:44 PM

## 2019-12-12 NOTE — Progress Notes (Signed)
°   12/12/19 1213  Vital Signs  Pulse Rate 90  BP (!) 133/94  MAP (mmHg) 105  BP Location Right Arm  BP Method Automatic  Patient Position (if appropriate) Standing  MEWS Score  MEWS Temp 0  MEWS Systolic 0  MEWS Pulse 0  MEWS RR 0  MEWS LOC 0  MEWS Score 0  MEWS Score Color Green   D:  Patient refused lisinopril stating that his cardiologist took him off it. Patient isolated in room.Patient denied SI/ HI and AVH.  Patient complained of constipation and dulcolax was given.  A:  Support and encouragement provided Routine safety checks conducted every 15 minutes. Patient  Informed to notify staff with any concerns.  R: Safety maintained.

## 2019-12-12 NOTE — BHH Suicide Risk Assessment (Signed)
[facility] INPATIENT:  Family/Significant Other Suicide Prevention Education  Suicide Prevention Education:  Patient Refusal for Family/Significant Other Suicide Prevention Education: The patient [name] has refused to provide written consent for family/significant other to be provided Family/Significant Other Suicide Prevention Education during admission and/or prior to discharge.  Physician notified.  Joellen Jersey 12/12/2019, 10:44 AM

## 2019-12-12 NOTE — Progress Notes (Signed)
[facility] MD Progress Note  12/12/2019 9:33 AM [name]  MRN:  [id number] Subjective:  "I'm not great."  [name] found lying in bed. He appears depressed and reports not feeling well due to back pain. He typically sleeps on a more comfortable mattress at home along with doing daily stretches/exercise that help with chronic back pain. He admits to neglecting his exercise routine and healthy diet recently related to depression. He reports a history of good response to Remeron but discontinued it due to feeling bruxism may have been related to the medication. He continued to experience bruxism after the Remeron was discontinued and now does not feel it was related. He has been isolating to his room. He reports sleep is good as well as appetite. He reports a history of overeating while depressed. He does not feel the Remeron has ever worsened his overeating. He had relapsed on alcohol and cocaine the week of admission. He denies any withdrawal symptoms. Denies SI. Denies HI, and specifically denies HI toward the man who owed him money.  From admission H&P: 59 year old male, presented to ED voluntarily yesterday, reporting worsening depression and suicidal ideations, also homicidal ideations towards a man who owes him money. He reports he had been doing " really well for a long time, taking my antidepressant (Remeron) , I was feeling better and everything was going ok".   Principal Problem: <principal problem not specified> Diagnosis: Active Problems:   Severe 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 features ([place])  Total Time spent with patient: 15 minutes  Past Psychiatric History: See admission H&P  Past Medical History:  Past Medical History:  Diagnosis Date  . Allergies   . Arthritis   . Chest pain   . Chronic lower back pain   . Chronic pain of right wrist   . Coronary artery disease    a. Multiple prior caths/PCI. Cath 2013 with possible spasm of RCA, 70% ISR of mid LCx with  subsequent DES to mLCx and prox LCX. b. H/o microvascular angina. c. Recurrent angina 08/2014 - s/p PTCA/DES to prox Cx, PTCA/CBA to OM1.  c. LHC 06/10/15 with patent stents and some ISR in LCX and OM-1 that was not flow limiting --> Rx   . Dyslipidemia    a. Intolerant to many statins except tolerating Livalo.  Marland Kitchen GERD (gastroesophageal reflux disease)   . H/O cardiac catheterization 10/25/2018  . Hypertension   . Myocardial infarction ([place]) ~ 2010  . S/P angioplasty with stent, DES, to proximal and mid LCX 12/15/11 12/15/2011  . Shoulder pain   . Stroke [facility])    pt. reports had a stroke around time of MI 2010  . Type II diabetes mellitus ([place])   . Unstable angina [facility])     Past Surgical History:  Procedure Laterality Date  . CARDIAC CATHETERIZATION  06/15/2002   LAD with prox 40% stenosis, norma L main, Cfx with 25% lesion, RCA with long mid 25% stenosis (Dr. Vita Barley)  . CARDIAC CATHETERIZATION  04/01/2010   normal L main, LAD wit mild stenosis, L Cfx with 70% in-stent restenosis, RCA with 70% in-stent restenosis, LVEF >60% (Dr. K. Mali Hilty) - cutting ballon arthrectomy to RCA & Cfx (Dr. Rockne Menghini)  . CARDIAC CATHETERIZATION  08/25/2010   preserved global LV contractility; multivessel CAD, diffuse 90-95% in-stent restenosis in prox placed Cfx stent - cutting balloon arthrectomy in Cfx with multiple dilatations 90-95% to 0% stenosis (Dr. Corky Downs)  . CARDIAC CATHETERIZATION  01/26/2011  PCI & stenting of aggresive in-stent restenosis within previously stented AV groove Cfx with 3.0x72mm Taxus DES (previous stents were Promus) (Dr. Adora Fridge)  . CARDIAC CATHETERIZATION  05/11/2011   preserved LV function, 40% mid LAD stenosis, 30-40% narrowing proximal to stented semgnet of prox Cfx, patent mid RCA stent with smooth 20% narrowing in distal RCA (Dr. Corky Downs)  . CARDIAC CATHETERIZATION  12/15/2011   PCI & stenting of proximal & mid Cfx with DES - 3.0x71mm in proximal, 3.0x66mm in mid (Dr. Adora Fridge)  . CARDIAC CATHETERIZATION N/A 06/10/2015   Procedure: Left Heart Cath and Coronary Angiography;  Surgeon: Jolaine Artist, MD;  Location: [place] CV LAB;  Service: Cardiovascular;  Laterality: N/A;  . CARDIAC CATHETERIZATION  10/25/2018  . cardiometabolic testing  2/29/7989   good exercise effort, peak VO2 79% predicted with normal VO2 HR curves (mild deconditioning)  . COLONOSCOPY  12/2012   diminutive hyperplastic sigmoid poyp so repeat routine 2024  . CORONARY BALLOON ANGIOPLASTY N/A 10/25/2018   Procedure: CORONARY BALLOON ANGIOPLASTY;  Surgeon: Jettie Booze, MD;  Location: [place] CV LAB;  Service: Cardiovascular;  Laterality: N/A;  . CORONARY BALLOON ANGIOPLASTY N/A 09/29/2019   Procedure: CORONARY BALLOON ANGIOPLASTY;  Surgeon: Nelva Bush, MD;  Location: [place] CV LAB;  Service: Cardiovascular;  Laterality: N/A;  . EXCISIONAL HEMORRHOIDECTOMY  1984  . INTRAVASCULAR ULTRASOUND/IVUS N/A 09/29/2019   Procedure: Intravascular Ultrasound/IVUS;  Surgeon: Nelva Bush, MD;  Location: [place] CV LAB;  Service: Cardiovascular;  Laterality: N/A;  . LEFT HEART CATH AND CORONARY ANGIOGRAPHY N/A 10/25/2018   Procedure: LEFT HEART CATH AND CORONARY ANGIOGRAPHY;  Surgeon: Jettie Booze, MD;  Location: [place] CV LAB;  Service: Cardiovascular;  Laterality: N/A;  . LEFT HEART CATH AND CORONARY ANGIOGRAPHY N/A 09/29/2019   Procedure: LEFT HEART CATH AND CORONARY ANGIOGRAPHY;  Surgeon: Nelva Bush, MD;  Location: [place] CV LAB;  Service: Cardiovascular;  Laterality: N/A;  . LEFT HEART CATHETERIZATION WITH CORONARY ANGIOGRAM N/A 05/11/2011   Procedure: LEFT HEART CATHETERIZATION WITH CORONARY ANGIOGRAM;  Surgeon: Troy Sine, MD;  Location: [facility] CATH LAB;  Service: Cardiovascular;  Laterality: N/A;  Possible percutaneous coronary intervention, possible IVUS  . LEFT HEART CATHETERIZATION WITH CORONARY ANGIOGRAM N/A 12/15/2011   Procedure: LEFT HEART  CATHETERIZATION WITH CORONARY ANGIOGRAM;  Surgeon: Lorretta Harp, MD;  Location: [facility] CATH LAB;  Service: Cardiovascular;  Laterality: N/A;  . LEFT HEART CATHETERIZATION WITH CORONARY ANGIOGRAM N/A 09/05/2014   Procedure: LEFT HEART CATHETERIZATION WITH CORONARY ANGIOGRAM;  Surgeon: Burnell Blanks, MD;  Location: [facility] CATH LAB;  Service: Cardiovascular;  Laterality: N/A;  . LIPOMA EXCISION     back of the head  . NM MYOCAR PERF WALL MOTION  02/2012   lexiscan myoview; mild perfusion defect in mid inferolateral & basal inferolateral region (infarct/scar); EF 52%, abnormal but ow risk scan  . PERCUTANEOUS CORONARY STENT INTERVENTION (PCI-S)  09/05/2014   Procedure: PERCUTANEOUS CORONARY STENT INTERVENTION (PCI-S);  Surgeon: Burnell Blanks, MD;  Location: [facility] CATH LAB;  Service: Cardiovascular;;   Family History:  Family History  Problem Relation Age of Onset  . Leukemia Mother   . Prostate cancer Father   . Coronary artery disease Paternal Grandmother   . Cancer Paternal Grandfather   . Cancer Brother    Family Psychiatric  History: See admission H&P Social History:  Social History   Substance and Sexual Activity  Alcohol Use Yes   Comment: 2 bottles of wine on  12/08/2019 (one occurrence)     Social History   Substance and Sexual Activity  Drug Use Yes  . Types: Cocaine   Comment: can't specify how much was used on 12/08/2019, "not much"    Social History   Socioeconomic History  . Marital status: Widowed    Spouse name: Not on file  . Number of children: 2  . Years of education: GED  . Highest education level: Not on file  Occupational History  . Not on file  Tobacco Use  . Smoking status: Former Smoker    Packs/day: 1.00    Years: 10.00    Pack years: 10.00    Types: Cigarettes    Quit date: 10/07/2018    Years since quitting: 1.1  . Smokeless tobacco: Never Used  Vaping Use  . Vaping Use: Never used  Substance and Sexual Activity  . Alcohol use: Yes     Comment: 2 bottles of wine on 12/08/2019 (one occurrence)  . Drug use: Yes    Types: Cocaine    Comment: can't specify how much was used on 12/08/2019, "not much"  . Sexual activity: Not Currently  Other Topics Concern  . Not on file  Social History Narrative  . Not on file   Social Determinants of Health   Financial Resource Strain:   . Difficulty of Paying Living Expenses:   Food Insecurity:   . Worried About Charity fundraiser in the Last Year:   . Arboriculturist in the Last Year:   Transportation Needs:   . Film/video editor (Medical):   Marland Kitchen Lack of Transportation (Non-Medical):   Physical Activity:   . Days of Exercise per Week:   . Minutes of Exercise per Session:   Stress:   . Feeling of Stress :   Social Connections:   . Frequency of Communication with Friends and Family:   . Frequency of Social Gatherings with Friends and Family:   . Attends Religious Services:   . Active Member of Clubs or Organizations:   . Attends Archivist Meetings:   Marland Kitchen Marital Status:    Additional Social History:                         Sleep: Good  Appetite:  Good  Current Medications: Current Facility-Administered Medications  Medication Dose Route Frequency Provider Last Rate Last Admin  . acetaminophen (TYLENOL) tablet 650 mg  650 mg Oral Q6H PRN Lindon Romp A, NP   650 mg at 12/12/19 0804  . amLODipine (NORVASC) tablet 10 mg  10 mg Oral Daily Lindon Romp A, NP   10 mg at 12/12/19 0759  . aspirin EC tablet 81 mg  81 mg Oral Daily Cobos, Myer Peer, MD   81 mg at 12/12/19 0758  . bisacodyl (DULCOLAX) EC tablet 5 mg  5 mg Oral Daily PRN Connye Burkitt, NP      . carvedilol (COREG) tablet 3.125 mg  3.125 mg Oral BID WC Cobos, Myer Peer, MD   3.125 mg at 12/12/19 0800  . isosorbide mononitrate (IMDUR) 24 hr tablet 90 mg  90 mg Oral Daily Lindon Romp A, NP   90 mg at 12/12/19 0756  . LORazepam (ATIVAN) tablet 1 mg  1 mg Oral Q6H PRN Lindon Romp A, NP       . magnesium hydroxide (MILK OF MAGNESIA) suspension 30 mL  30 mL Oral Daily PRN Connye Burkitt, NP  30 mL at 12/11/19 1804  . metFORMIN (GLUCOPHAGE) tablet 500 mg  500 mg Oral BID WC Cobos, Myer Peer, MD   500 mg at 12/12/19 0759  . mirtazapine (REMERON) tablet 15 mg  15 mg Oral QHS Cobos, Myer Peer, MD   15 mg at 12/11/19 2124  . multivitamin with minerals tablet 1 tablet  1 tablet Oral Daily Lindon Romp A, NP   1 tablet at 12/12/19 0800  . nitroGLYCERIN (NITROSTAT) SL tablet 0.4 mg  0.4 mg Sublingual Q5 Min x 3 PRN Lindon Romp A, NP      . thiamine tablet 100 mg  100 mg Oral Daily Lindon Romp A, NP   100 mg at 12/12/19 0800  . ticagrelor (BRILINTA) tablet 90 mg  90 mg Oral BID Lindon Romp A, NP   90 mg at 12/12/19 5465    Lab Results:  Results for orders placed or performed during the hospital encounter of 12/10/19 (from the past 48 hour(s))  Glucose, capillary     Status: Abnormal   Collection Time: 12/12/19  6:06 AM  Result Value Ref Range   Glucose-Capillary 119 (H) 70 - 99 mg/dL    Comment: Glucose reference range applies only to samples taken after fasting for at least 8 hours.  Hemoglobin A1c     Status: Abnormal   Collection Time: 12/12/19  6:30 AM  Result Value Ref Range   Hgb A1c MFr Bld 6.9 (H) 4.8 - 5.6 %    Comment: (NOTE) Pre diabetes:          5.7%-6.4%  Diabetes:              >6.4%  Glycemic control for   <7.0% adults with diabetes    Mean Plasma Glucose 151.33 mg/dL    Comment: Performed at [facility] [address], [place] [employer], [place] [address]    Blood Alcohol level:  Lab Results  Component Value Date   ETH <10 12/09/2019   ETH <10 51/70/0174    Metabolic Disorder Labs: Lab Results  Component Value Date   HGBA1C 6.9 (H) 12/12/2019   MPG 151.33 12/12/2019   MPG 131.24 09/07/2019   No results found for: PROLACTIN Lab Results  Component Value Date   CHOL 142 12/10/2019   TRIG 146 12/10/2019   HDL 41 12/10/2019   CHOLHDL 3.5  12/10/2019   VLDL 29 12/10/2019   LDLCALC 72 12/10/2019   LDLCALC 110 (H) 09/08/2019    Physical Findings: AIMS:  , ,  ,  ,    CIWA:  CIWA-Ar Total: 2 COWS:     Musculoskeletal: Strength & Muscle Tone: within normal limits Gait & Station: normal Patient leans: N/A  Psychiatric Specialty Exam: Physical Exam  Nursing note and vitals reviewed. Constitutional: He is oriented to person, place, and time. He appears well-developed.  Cardiovascular: Normal rate.  Respiratory: Effort normal.  Neurological: He is alert and oriented to person, place, and time.    Review of Systems  Constitutional: Negative.   Gastrointestinal: Positive for constipation. Negative for nausea and vomiting.  Neurological: Negative for tremors and headaches.  Psychiatric/Behavioral: Positive for dysphoric mood. Negative for agitation, behavioral problems, hallucinations, self-injury, sleep disturbance and suicidal ideas. The patient is not nervous/anxious and is not hyperactive.     Blood pressure (!) 147/109, pulse 79, temperature 98.3 F (36.8 C), temperature source Oral, resp. rate 18, height 5\' 8"  (1.727 m), weight 86.6 kg, SpO2 98 %.Body mass index is 29.04 kg/m.  General  Appearance: Fairly Groomed  Eye Contact:  Minimal  Speech:  Normal Rate  Volume:  Normal  Mood:  Depressed  Affect:  Congruent  Thought Process:  Coherent  Orientation:  Full (Time, Place, and Person)  Thought Content:  Logical  Suicidal Thoughts:  No  Homicidal Thoughts:  No  Memory:  Immediate;   Fair Recent;   Fair  Judgement:  Intact  Insight:  Fair  Psychomotor Activity:  Decreased  Concentration:  Concentration: Fair and Attention Span: Fair  Recall:  [employer] of Knowledge:  Fair  Language:  Good  Akathisia:  No  Handed:  Right  AIMS (if indicated):     Assets:  Communication Skills Desire for Improvement Financial Resources/Insurance  ADL's:  Intact  Cognition:  WNL  Sleep:  Number of Hours: 6.75      Treatment Plan Summary: Daily contact with patient to assess and evaluate symptoms and progress in treatment and Medication management   Continue inpatient hospitalization.  Continue Remeron 15 mg PO QHS for depression Continue Norvasc 10 mg PO daily for HTN Continue ASA 81 mg PO daily for CAD Continue Brilinta 90 mg PO daily for CAD Continue Coreg 3.125 mg PO BID for HTN Continue Imdur 90 mg PO daily for HTN Continue metformin 500 mg PO BID for diabetes Continue thiamine 100 mg PO daily for supplementation Continue Dulcolax 5 mg PO daily PRN constipation  Patient will participate in the therapeutic group milieu.  Discharge disposition in progress.   Connye Burkitt, NP 12/12/2019, 9:33 AM

## 2019-12-13 LAB — GLUCOSE, CAPILLARY: Glucose-Capillary: 118 mg/dL — ABNORMAL HIGH (ref 70–99)

## 2019-12-13 MED ORDER — BISACODYL 5 MG PO TBEC: 5.0000 mg | DELAYED_RELEASE_TABLET | Freq: Every day | ORAL | 0 refills | Status: DC | PRN

## 2019-12-13 MED ORDER — MIRTAZAPINE 15 MG PO TABS: 15.0000 mg | ORAL_TABLET | Freq: Every day | ORAL | 0 refills | Status: DC

## 2019-12-13 MED ORDER — LISINOPRIL 10 MG PO TABS: 10.0000 mg | ORAL_TABLET | Freq: Every day | ORAL | 0 refills | Status: DC

## 2019-12-13 NOTE — Progress Notes (Signed)
[facility] MD Progress Note  12/13/2019 3:08 PM JHAMAL PLUCINSKI  MRN:  [id number]  Subjective: Domique reports, "I'm doing well".  Objective: 59 year old male, presented to ED voluntarily yesterday, reporting worsening depression and suicidal ideations, also homicidal ideations towards a man who owes him money. He reports he had been doing " really well for a long time, taking my antidepressant (Remeron) , I was feeling better and everything was going ok".  Rudolfo is seen, chart reviewed. The chart findings discussed with the treatment team. He presents alert, oriented x 4,  sitting up in the day room, attending group session. He says he is doing well. Denies any symptoms of depression or anxiety. He is taking & tolerating his treatment regimen. He denies any adverse effects or reactions. Syre says he stopped his antidepressant medications thinking it was causing him to grind his teeth. He says that led to the exacerbation of his symptoms. He says he currently doing much better. He denies any SIHI, AVH, delusional thoughts or paranoia. He does not appear to be responding to any internal stimuli. He is in agreement to continue his current plan of care as already in progress.  Principal Problem: Severe 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 features ([place])  Diagnosis: Principal Problem:   Severe 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 features ([place])  Total Time spent with patient: 15 minutes  Past Psychiatric History: See admission H&P  Past Medical History:  Past Medical History:  Diagnosis Date  . Allergies   . Arthritis   . Chest pain   . Chronic lower back pain   . Chronic pain of right wrist   . Coronary artery disease    a. Multiple prior caths/PCI. Cath 2013 with possible spasm of RCA, 70% ISR of mid LCx with subsequent DES to mLCx and prox LCX. b. H/o microvascular angina. c. Recurrent angina 08/2014 - s/p PTCA/DES to prox Cx, PTCA/CBA to OM1.  c. LHC 06/10/15 with patent stents  and some ISR in LCX and OM-1 that was not flow limiting --> Rx   . Dyslipidemia    a. Intolerant to many statins except tolerating Livalo.  Marland Kitchen GERD (gastroesophageal reflux disease)   . H/O cardiac catheterization 10/25/2018  . Hypertension   . Myocardial infarction ([place]) ~ 2010  . S/P angioplasty with stent, DES, to proximal and mid LCX 12/15/11 12/15/2011  . Shoulder pain   . Stroke [facility])    pt. reports had a stroke around time of MI 2010  . Type II diabetes mellitus ([place])   . Unstable angina [facility])     Past Surgical History:  Procedure Laterality Date  . CARDIAC CATHETERIZATION  06/15/2002   LAD with prox 40% stenosis, norma L main, Cfx with 25% lesion, RCA with long mid 25% stenosis (Dr. Vita Barley)  . CARDIAC CATHETERIZATION  04/01/2010   normal L main, LAD wit mild stenosis, L Cfx with 70% in-stent restenosis, RCA with 70% in-stent restenosis, LVEF >60% (Dr. K. Mali Hilty) - cutting ballon arthrectomy to RCA & Cfx (Dr. Rockne Menghini)  . CARDIAC CATHETERIZATION  08/25/2010   preserved global LV contractility; multivessel CAD, diffuse 90-95% in-stent restenosis in prox placed Cfx stent - cutting balloon arthrectomy in Cfx with multiple dilatations 90-95% to 0% stenosis (Dr. Corky Downs)  . CARDIAC CATHETERIZATION  01/26/2011   PCI & stenting of aggresive in-stent restenosis within previously stented AV groove Cfx with 3.0x80mm Taxus DES (previous stents were Promus) (Dr. Adora Fridge)  . CARDIAC CATHETERIZATION  05/11/2011  preserved LV function, 40% mid LAD stenosis, 30-40% narrowing proximal to stented semgnet of prox Cfx, patent mid RCA stent with smooth 20% narrowing in distal RCA (Dr. Corky [place])  . CARDIAC CATHETERIZATION  12/15/2011   PCI & stenting of proximal & mid Cfx with DES - 3.0x83mm in proximal, 3.0x56mm in mid (Dr. Adora Fridge)  . CARDIAC CATHETERIZATION N/A 06/10/2015   Procedure: Left Heart Cath and Coronary Angiography;  Surgeon: Jolaine Artist, MD;  Location: [place] CV LAB;   Service: Cardiovascular;  Laterality: N/A;  . CARDIAC CATHETERIZATION  10/25/2018  . cardiometabolic testing  7/56/4332   good exercise effort, peak VO2 79% predicted with normal VO2 HR curves (mild deconditioning)  . COLONOSCOPY  12/2012   diminutive hyperplastic sigmoid poyp so repeat routine 2024  . CORONARY BALLOON ANGIOPLASTY N/A 10/25/2018   Procedure: CORONARY BALLOON ANGIOPLASTY;  Surgeon: Jettie Booze, MD;  Location: [place] CV LAB;  Service: Cardiovascular;  Laterality: N/A;  . CORONARY BALLOON ANGIOPLASTY N/A 09/29/2019   Procedure: CORONARY BALLOON ANGIOPLASTY;  Surgeon: Nelva Bush, MD;  Location: [place] CV LAB;  Service: Cardiovascular;  Laterality: N/A;  . EXCISIONAL HEMORRHOIDECTOMY  1984  . INTRAVASCULAR ULTRASOUND/IVUS N/A 09/29/2019   Procedure: Intravascular Ultrasound/IVUS;  Surgeon: Nelva Bush, MD;  Location: [place] CV LAB;  Service: Cardiovascular;  Laterality: N/A;  . LEFT HEART CATH AND CORONARY ANGIOGRAPHY N/A 10/25/2018   Procedure: LEFT HEART CATH AND CORONARY ANGIOGRAPHY;  Surgeon: Jettie Booze, MD;  Location: [place] CV LAB;  Service: Cardiovascular;  Laterality: N/A;  . LEFT HEART CATH AND CORONARY ANGIOGRAPHY N/A 09/29/2019   Procedure: LEFT HEART CATH AND CORONARY ANGIOGRAPHY;  Surgeon: Nelva Bush, MD;  Location: [place] CV LAB;  Service: Cardiovascular;  Laterality: N/A;  . LEFT HEART CATHETERIZATION WITH CORONARY ANGIOGRAM N/A 05/11/2011   Procedure: LEFT HEART CATHETERIZATION WITH CORONARY ANGIOGRAM;  Surgeon: [place] Sine, MD;  Location: [facility] CATH LAB;  Service: Cardiovascular;  Laterality: N/A;  Possible percutaneous coronary intervention, possible IVUS  . LEFT HEART CATHETERIZATION WITH CORONARY ANGIOGRAM N/A 12/15/2011   Procedure: LEFT HEART CATHETERIZATION WITH CORONARY ANGIOGRAM;  Surgeon: Lorretta Harp, MD;  Location: [facility] CATH LAB;  Service: Cardiovascular;  Laterality: N/A;  . LEFT HEART CATHETERIZATION  WITH CORONARY ANGIOGRAM N/A 09/05/2014   Procedure: LEFT HEART CATHETERIZATION WITH CORONARY ANGIOGRAM;  Surgeon: Burnell Blanks, MD;  Location: [facility] CATH LAB;  Service: Cardiovascular;  Laterality: N/A;  . LIPOMA EXCISION     back of the head  . NM MYOCAR PERF WALL MOTION  02/2012   lexiscan myoview; mild perfusion defect in mid inferolateral & basal inferolateral region (infarct/scar); EF 52%, abnormal but ow risk scan  . PERCUTANEOUS CORONARY STENT INTERVENTION (PCI-S)  09/05/2014   Procedure: PERCUTANEOUS CORONARY STENT INTERVENTION (PCI-S);  Surgeon: Burnell Blanks, MD;  Location: [facility] CATH LAB;  Service: Cardiovascular;;   Family History:  Family History  Problem Relation Age of Onset  . Leukemia Mother   . Prostate cancer Father   . Coronary artery disease Paternal Grandmother   . Cancer Paternal Grandfather   . Cancer Brother    Family Psychiatric  History: See admission H&P  Social History:  Social History   Substance and Sexual Activity  Alcohol Use Yes   Comment: 2 bottles of wine on 12/08/2019 (one occurrence)     Social History   Substance and Sexual Activity  Drug Use Yes  . Types: Cocaine   Comment: can't specify how much was  used on 12/08/2019, "not much"    Social History   Socioeconomic History  . Marital status: Widowed    Spouse name: Not on file  . Number of children: 2  . Years of education: GED  . Highest education level: Not on file  Occupational History  . Not on file  Tobacco Use  . Smoking status: Former Smoker    Packs/day: 1.00    Years: 10.00    Pack years: 10.00    Types: Cigarettes    Quit date: 10/07/2018    Years since quitting: 1.1  . Smokeless tobacco: Never Used  Vaping Use  . Vaping Use: Never used  Substance and Sexual Activity  . Alcohol use: Yes    Comment: 2 bottles of wine on 12/08/2019 (one occurrence)  . Drug use: Yes    Types: Cocaine    Comment: can't specify how much was used on 12/08/2019, "not much"   . Sexual activity: Not Currently  Other Topics Concern  . Not on file  Social History Narrative  . Not on file   Social Determinants of Health   Financial Resource Strain:   . Difficulty of Paying Living Expenses:   Food Insecurity:   . Worried About Charity fundraiser in the Last Year:   . Arboriculturist in the Last Year:   Transportation Needs:   . Film/video editor (Medical):   Marland Kitchen Lack of Transportation (Non-Medical):   Physical Activity:   . Days of Exercise per Week:   . Minutes of Exercise per Session:   Stress:   . Feeling of Stress :   Social Connections:   . Frequency of Communication with Friends and Family:   . Frequency of Social Gatherings with Friends and Family:   . Attends Religious Services:   . Active Member of Clubs or Organizations:   . Attends Archivist Meetings:   Marland Kitchen Marital Status:    Additional Social History:   Sleep: Good  Appetite:  Good  Current Medications: Current Facility-Administered Medications  Medication Dose Route Frequency Provider Last Rate Last Admin  . acetaminophen (TYLENOL) tablet 650 mg  650 mg Oral Q6H PRN Lindon Romp A, NP   650 mg at 12/12/19 0804  . amLODipine (NORVASC) tablet 10 mg  10 mg Oral Daily Lindon Romp A, NP   10 mg at 12/13/19 0925  . aspirin EC tablet 81 mg  81 mg Oral Daily Cobos, Myer Peer, MD   81 mg at 12/13/19 0924  . bisacodyl (DULCOLAX) EC tablet 5 mg  5 mg Oral Daily PRN Connye Burkitt, NP   5 mg at 12/12/19 1241  . isosorbide mononitrate (IMDUR) 24 hr tablet 90 mg  90 mg Oral Daily Cobos, Myer Peer, MD   90 mg at 12/13/19 0924  . lisinopril (ZESTRIL) tablet 10 mg  10 mg Oral Daily Cobos, Myer Peer, MD   10 mg at 12/13/19 0924  . magnesium hydroxide (MILK OF MAGNESIA) suspension 30 mL  30 mL Oral Daily PRN Connye Burkitt, NP   30 mL at 12/11/19 1804  . metFORMIN (GLUCOPHAGE) tablet 500 mg  500 mg Oral BID WC Cobos, Myer Peer, MD   500 mg at 12/13/19 0924  . mirtazapine (REMERON)  tablet 15 mg  15 mg Oral QHS Cobos, Myer Peer, MD   15 mg at 12/12/19 2110  . multivitamin with minerals tablet 1 tablet  1 tablet Oral Daily Rozetta Nunnery, NP  1 tablet at 12/13/19 0923  . nitroGLYCERIN (NITROSTAT) SL tablet 0.4 mg  0.4 mg Sublingual Q5 Min x 3 PRN Lindon Romp A, NP      . thiamine tablet 100 mg  100 mg Oral Daily Lindon Romp A, NP   100 mg at 12/13/19 0925  . ticagrelor (BRILINTA) tablet 90 mg  90 mg Oral BID Lindon Romp A, NP   90 mg at 12/13/19 4097   Lab Results:  Results for orders placed or performed during the hospital encounter of 12/10/19 (from the past 48 hour(s))  Glucose, capillary     Status: Abnormal   Collection Time: 12/12/19  6:06 AM  Result Value Ref Range   Glucose-Capillary 119 (H) 70 - 99 mg/dL    Comment: Glucose reference range applies only to samples taken after fasting for at least 8 hours.  Hemoglobin A1c     Status: Abnormal   Collection Time: 12/12/19  6:30 AM  Result Value Ref Range   Hgb A1c MFr Bld 6.9 (H) 4.8 - 5.6 %    Comment: (NOTE) Pre diabetes:          5.7%-6.4%  Diabetes:              >6.4%  Glycemic control for   <7.0% adults with diabetes    Mean Plasma Glucose 151.33 mg/dL    Comment: Performed at [facility] [address] [place], [place] [address]  Glucose, capillary     Status: Abnormal   Collection Time: 12/13/19  5:53 AM  Result Value Ref Range   Glucose-Capillary 118 (H) 70 - 99 mg/dL    Comment: Glucose reference range applies only to samples taken after fasting for at least 8 hours.   Comment 1 Notify RN    Comment 2 Document in Chart    Blood Alcohol level:  Lab Results  Component Value Date   ETH <10 12/09/2019   ETH <10 92/42/6834   Metabolic Disorder Labs: Lab Results  Component Value Date   HGBA1C 6.9 (H) 12/12/2019   MPG 151.33 12/12/2019   MPG 131.24 09/07/2019   No results found for: PROLACTIN Lab Results  Component Value Date   CHOL 142 12/10/2019   TRIG 146 12/10/2019    HDL 41 12/10/2019   CHOLHDL 3.5 12/10/2019   VLDL 29 12/10/2019   LDLCALC 72 12/10/2019   LDLCALC 110 (H) 09/08/2019   Physical Findings: AIMS:  , ,  ,  ,    CIWA:  CIWA-Ar Total: 0 COWS:     Musculoskeletal: Strength & Muscle Tone: within normal limits Gait & Station: normal Patient leans: N/A  Psychiatric Specialty Exam: Physical Exam  Nursing note and vitals reviewed. Constitutional: He is oriented to person, place, and time. He appears well-developed.  Cardiovascular: Normal rate.  Respiratory: Effort normal.  Neurological: He is alert and oriented to person, place, and time.    Review of Systems  Constitutional: Negative.   Gastrointestinal: Positive for constipation. Negative for nausea and vomiting.  Neurological: Negative for tremors and headaches.  Psychiatric/Behavioral: Positive for dysphoric mood. Negative for agitation, behavioral problems, hallucinations, self-injury, sleep disturbance and suicidal ideas. The patient is not nervous/anxious and is not hyperactive.     Blood pressure 131/88, pulse 85, temperature (!) 97.3 F (36.3 C), temperature source Oral, resp. rate 18, height 5\' 8"  (1.727 m), weight 86.6 kg, SpO2 98 %.Body mass index is 29.04 kg/m.  General Appearance: Fairly Groomed  Eye Contact:  Minimal  Speech:  Normal Rate  Volume:  Normal  Mood:  Euthymic  Affect:  Congruent  Thought Process:  Coherent  Orientation:  Full (Time, Place, and Person)  Thought Content:  Logical  Suicidal Thoughts:  No  Homicidal Thoughts:  No  Memory:  Immediate;   Fair Recent;   Fair  Judgement:  Intact  Insight:  Fair  Psychomotor Activity:  Decreased  Concentration:  Concentration: Fair and Attention Span: Fair  Recall:  [employer] of Knowledge:  Fair  Language:  Good  Akathisia:  No  Handed:  Right  AIMS (if indicated):     Assets:  Communication Skills Desire for Improvement Financial Resources/Insurance  ADL's:  Intact  Cognition:  WNL  Sleep:   Number of Hours: 3.75   Treatment Plan Summary: Daily contact with patient to assess and evaluate symptoms and progress in treatment and Medication management   - Continue inpatient hospitalization. - Will continue today 12/13/2019 plan as below except where it is noted.  Continue Remeron 15 mg PO QHS for depression Continue Norvasc 10 mg PO daily for HTN Continue ASA 81 mg PO daily for CAD Continue Brilinta 90 mg PO daily for CAD Continue Coreg 3.125 mg PO BID for HTN Continue Imdur 90 mg PO daily for HTN Continue metformin 500 mg PO BID for diabetes Continue thiamine 100 mg PO daily for supplementation Continue Dulcolax 5 mg PO daily PRN constipation  Patient will participate in the therapeutic group milieu. Discharge disposition plan in progress.   Lindell Spar, NP, PMHNP, FNP-BC 12/13/2019, 3:08 PMPatient ID: Lawson Fiscal, male   DOB: 01-08-1961, 59 y.o.   MRN: [id number]

## 2019-12-13 NOTE — Progress Notes (Signed)
Discharge Note:  Patient denies SI/HI AVH at this time.Patient requested to be discharged because of an early morning medical appointment for his hand. Discharge instructions, AVS, prescriptions and transition record gone over with patient. Patient agrees to comply with medication management, follow-up visit, and outpatient therapy. Patient belongings returned to patient. Patient questions and concerns addressed and answered.  Patient ambulatory off unit.  Patient discharged to home.

## 2019-12-13 NOTE — [facility] Suicide Risk Assessment (Signed)
[facility] Discharge Suicide Risk Assessment   Principal Problem: Severe 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 features [facility]) Discharge Diagnoses: Principal Problem:   Severe 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 features ([place])   Total Time spent with patient: 15 minutes  Musculoskeletal: Strength & Muscle Tone: within normal limits Gait & Station: normal Patient leans: N/A  Psychiatric Specialty Exam: Review of Systems  All other systems reviewed and are negative.   Blood pressure 131/88, pulse 85, temperature (!) 97.3 F (36.3 C), temperature source Oral, resp. rate 18, height 5\' 8"  (1.727 m), weight 86.6 kg, SpO2 98 %.Body mass index is 29.04 kg/m.  General Appearance: Casual  Eye Contact::  Good  Speech:  Normal Rate409  Volume:  Normal  Mood:  Euthymic  Affect:  Congruent  Thought Process:  Coherent and Descriptions of Associations: Intact  Orientation:  Full (Time, Place, and Person)  Thought Content:  Logical  Suicidal Thoughts:  No  Homicidal Thoughts:  No  Memory:  Immediate;   Fair Recent;   Fair Remote;   Fair  Judgement:  Intact  Insight:  Fair  Psychomotor Activity:  Normal  Concentration:  Fair  Recall:  Good  Fund of Knowledge:Good  Language: Good  Akathisia:  Negative  Handed:  Right  AIMS (if indicated):     Assets:  Desire for Improvement Housing Resilience  Sleep:  Number of Hours: 3.75  Cognition: WNL  ADL's:  Intact   Mental Status Per Nursing Assessment::   On Admission:  Suicidal ideation indicated by patient (with no plan, verbally contracts for safety)  Demographic Factors:  Male and Low socioeconomic status  Loss Factors: Decline in physical health  Historical Factors: Impulsivity  Risk Reduction Factors:   Positive coping skills or problem solving skills  Continued Clinical Symptoms:  Depression:   Impulsivity Insomnia  Cognitive Features That Contribute To Risk:  None    Suicide Risk:  Minimal: No  identifiable suicidal ideation.  Patients presenting with no risk factors but with morbid ruminations; may be classified as minimal risk based on the severity of the depressive symptoms   Follow-up Information    [facility]. Go on 12/20/2019.   Why: You are scheduled for an appointment with this provider on 12/20/19.  Please contact this provider to obtain the time of this appointment.  This appointment will be held in person. Contact information: [address]  [place] [address] [phone]               Plan Of Care/Follow-up recommendations:  Activity:  ad lib  Sharma Covert, MD 12/13/2019, 3:41 PM

## 2019-12-13 NOTE — Progress Notes (Signed)
  [facility] Adult Case Management Discharge Plan :  Will you be returning to the same living situation after discharge:  Yes,  home. At discharge, do you have transportation home?: Yes,  Safe Transportation Do you have the ability to pay for your medications: Yes,  has Medicaid.  Release of information consent forms completed and in the chart;  Patient's signature needed at discharge.  Patient to Follow up at:  Follow-up Information    [facility]. Go on 12/20/2019.   Why: You are scheduled for an appointment with this provider on 12/20/19.  Please contact this provider to obtain the time of this appointment.  This appointment will be held in person. Contact information: [place]  [place] [address] [phone]               Next level of care provider has access to [place] and Suicide Prevention discussed: Yes,  with patient. Patient declined consents.  Has patient been referred to the Quitline?: Patient refused referral  Patient has been referred for addiction treatment: Yes  Joellen Jersey, [place] 12/13/2019, 3:55 PM
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
## 2019-12-25 NOTE — Progress Notes (Deleted)
Cardiology Office Note:    Date:  12/25/2019   ID:  Lawson Fiscal, DOB May 27, 1961, MRN [id number]  PCP:  Azzie Glatter, FNP  Cardiologist:  Pixie Casino, MD   Referring MD: Azzie Glatter, FNP   No chief complaint on file. ***  History of Present Illness:    [name] is a 59 y.o. male with CAD s/p PCI to RCA, LCx, and second marginal with subsequent PCI for ISR of LCx and 2nd marginal most recently 09/2018. He also has hypertension, HLD, DM2, hx of depression, cocaine use, and alcohol use disorder. Due to continued exertional angina, imdur was increased and ranexa was considered. He was admitted to [facility] for suicidal ideation and increased depression 3/11-3/15/21. He presented to [facility] with exertional chest pain and underwent LHC 10/25/18 which revealed ISR in the LCx and OM2 treated with balloon angioplasty. Patent RCA stent and mild to moderate stenosis of the mid LAD that improved with IV nitro suggesting vasospasm. He was admitted to [facility] 09/2019 with unstable angina. Heart cath a that time revealed ISR of the LCx and OM2 again treated with balloon angioplasty. Echo at that time with preserved EF and mild DD, no significant valvular disease. Following heart cath, he experienced severe chest pain. In review of cath with Dr. Saunders Revel, small distal LCx was jailed during the procedure. He recovered well and was discharged with DAPT x 12 months. Continued on imdur, amlodipine, and BB. Continue statin, ranexa was added.   He had a recent admission to [facility] for increased depression with suicidal ideation and homicidal ideation against someone who owed him money. This occurred after stopping his medication.     CAD with multiple interventions to LCx and OM2 - continue ASA and brilinta - ?ranexa - continue imdur, amlodipine, and BB - if recurrent chest pain, may need to consider single vessel CABG, brachytherapy, or drug coated balloon   Hypertension - medications as  above   Hyperlipidemia with LDL less than 70 12/10/2019: Cholesterol 142; HDL 41; LDL Cholesterol 72; Triglycerides 146; VLDL 29 - need further reduction of LDL - repatha?     Past Medical History:  Diagnosis Date  . Allergies   . Arthritis   . Chest pain   . Chronic lower back pain   . Chronic pain of right wrist   . Coronary artery disease    a. Multiple prior caths/PCI. Cath 2013 with possible spasm of RCA, 70% ISR of mid LCx with subsequent DES to mLCx and prox LCX. b. H/o microvascular angina. c. Recurrent angina 08/2014 - s/p PTCA/DES to prox Cx, PTCA/CBA to OM1.  c. LHC 06/10/15 with patent stents and some ISR in LCX and OM-1 that was not flow limiting --> Rx   . Dyslipidemia    a. Intolerant to many statins except tolerating Livalo.  Marland Kitchen GERD (gastroesophageal reflux disease)   . H/O cardiac catheterization 10/25/2018  . Hypertension   . Myocardial infarction ([place]) ~ 2010  . S/P angioplasty with stent, DES, to proximal and mid LCX 12/15/11 12/15/2011  . Shoulder pain   . Stroke [facility])    pt. reports had a stroke around time of MI 2010  . Type II diabetes mellitus ([place])   . Unstable angina [facility])     Past Surgical History:  Procedure Laterality Date  . CARDIAC CATHETERIZATION  06/15/2002   LAD with prox 40% stenosis, norma L main, Cfx with 25% lesion, RCA with long mid 25% stenosis (Dr. Lenna Sciara.  H[place])  . CARDIAC CATHETERIZATION  04/01/2010   normal L main, LAD wit mild stenosis, L Cfx with 70% in-stent restenosis, RCA with 70% in-stent restenosis, LVEF >60% (Dr. K. Mali Hilty) - cutting ballon arthrectomy to RCA & Cfx (Dr. Rockne Menghini)  . CARDIAC CATHETERIZATION  08/25/2010   preserved global LV contractility; multivessel CAD, diffuse 90-95% in-stent restenosis in prox placed Cfx stent - cutting balloon arthrectomy in Cfx with multiple dilatations 90-95% to 0% stenosis (Dr. Corky Downs)  . CARDIAC CATHETERIZATION  01/26/2011   PCI & stenting of aggresive in-stent restenosis within  previously stented AV groove Cfx with 3.0x50mm Taxus DES (previous stents were Promus) (Dr. Adora Fridge)  . CARDIAC CATHETERIZATION  05/11/2011   preserved LV function, 40% mid LAD stenosis, 30-40% narrowing proximal to stented semgnet of prox Cfx, patent mid RCA stent with smooth 20% narrowing in distal RCA (Dr. Corky Downs)  . CARDIAC CATHETERIZATION  12/15/2011   PCI & stenting of proximal & mid Cfx with DES - 3.0x17mm in proximal, 3.0x74mm in mid (Dr. Adora Fridge)  . CARDIAC CATHETERIZATION N/A 06/10/2015   Procedure: Left Heart Cath and Coronary Angiography;  Surgeon: Jolaine Artist, MD;  Location: [place] CV [facility];  Service: Cardiovascular;  Laterality: N/A;  . CARDIAC CATHETERIZATION  10/25/2018  . cardiometabolic testing  8/41/3244   good exercise effort, peak VO2 79% predicted with normal VO2 HR curves (mild deconditioning)  . COLONOSCOPY  12/2012   diminutive hyperplastic sigmoid poyp so repeat routine 2024  . CORONARY BALLOON ANGIOPLASTY N/A 10/25/2018   Procedure: CORONARY BALLOON ANGIOPLASTY;  Surgeon: Jettie Booze, MD;  Location: [place] [facility];  Service: Cardiovascular;  Laterality: N/A;  . CORONARY BALLOON ANGIOPLASTY N/A 09/29/2019   Procedure: CORONARY BALLOON ANGIOPLASTY;  Surgeon: Nelva Bush, MD;  Location: [place] [facility];  Service: Cardiovascular;  Laterality: N/A;  . EXCISIONAL HEMORRHOIDECTOMY  1984  . INTRAVASCULAR ULTRASOUND/IVUS N/A 09/29/2019   Procedure: Intravascular Ultrasound/IVUS;  Surgeon: Nelva Bush, MD;  Location: [place] [facility];  Service: Cardiovascular;  Laterality: N/A;  . LEFT HEART CATH AND CORONARY ANGIOGRAPHY N/A 10/25/2018   Procedure: LEFT HEART CATH AND CORONARY ANGIOGRAPHY;  Surgeon: Jettie Booze, MD;  Location: [place] [facility];  Service: Cardiovascular;  Laterality: N/A;  . LEFT HEART CATH AND CORONARY ANGIOGRAPHY N/A 09/29/2019   Procedure: LEFT HEART CATH AND CORONARY ANGIOGRAPHY;  Surgeon: Nelva Bush, MD;   Location: [place] [facility];  Service: Cardiovascular;  Laterality: N/A;  . LEFT HEART CATHETERIZATION WITH CORONARY ANGIOGRAM N/A 05/11/2011   Procedure: LEFT HEART CATHETERIZATION WITH CORONARY ANGIOGRAM;  Surgeon: Troy Sine, MD;  Location: [facility] CATH LAB;  Service: Cardiovascular;  Laterality: N/A;  Possible percutaneous coronary intervention, possible IVUS  . LEFT HEART CATHETERIZATION WITH CORONARY ANGIOGRAM N/A 12/15/2011   Procedure: LEFT HEART CATHETERIZATION WITH CORONARY ANGIOGRAM;  Surgeon: Lorretta Harp, MD;  Location: [facility] CATH LAB;  Service: Cardiovascular;  Laterality: N/A;  . LEFT HEART CATHETERIZATION WITH CORONARY ANGIOGRAM N/A 09/05/2014   Procedure: LEFT HEART CATHETERIZATION WITH CORONARY ANGIOGRAM;  Surgeon: Burnell Blanks, MD;  Location: [facility] CATH LAB;  Service: Cardiovascular;  Laterality: N/A;  . LIPOMA EXCISION     back of the head  . NM MYOCAR PERF WALL MOTION  02/2012   lexiscan myoview; mild perfusion defect in mid inferolateral & basal inferolateral region (infarct/scar); EF 52%, abnormal but ow risk scan  . PERCUTANEOUS CORONARY STENT INTERVENTION (PCI-S)  09/05/2014   Procedure: PERCUTANEOUS CORONARY STENT  INTERVENTION (PCI-S);  Surgeon: Burnell Blanks, MD;  Location: [facility] CATH LAB;  Service: Cardiovascular;;    Current Medications: No outpatient medications have been marked as taking for the 12/28/19 encounter (Appointment) with Ledora Bottcher, [place].     Allergies:   Bee venom, Shellfish allergy, Statins, and Testosterone cypionate   Social History   Socioeconomic History  . Marital status: Widowed    Spouse name: Not on file  . Number of children: 2  . Years of education: GED  . Highest education level: Not on file  Occupational History  . Not on file  Tobacco Use  . Smoking status: Former Smoker    Packs/day: 1.00    Years: 10.00    Pack years: 10.00    Types: Cigarettes    Quit date: 10/07/2018    Years since quitting: 1.2  .  Smokeless tobacco: Never Used  Vaping Use  . Vaping Use: Never used  Substance and Sexual Activity  . Alcohol use: Yes    Comment: 2 bottles of wine on 12/08/2019 (one occurrence)  . Drug use: Yes    Types: Cocaine    Comment: can't specify how much was used on 12/08/2019, "not much"  . Sexual activity: Not Currently  Other Topics Concern  . Not on file  Social History Narrative  . Not on file   Social Determinants of Health   Financial Resource Strain:   . Difficulty of Paying Living Expenses:   Food Insecurity:   . Worried About Charity fundraiser in the Last Year:   . Arboriculturist in the Last Year:   Transportation Needs:   . Film/video editor (Medical):   Marland Kitchen Lack of Transportation (Non-Medical):   Physical Activity:   . Days of Exercise per Week:   . Minutes of Exercise per Session:   Stress:   . Feeling of Stress :   Social Connections:   . Frequency of Communication with Friends and Family:   . Frequency of Social Gatherings with Friends and Family:   . Attends Religious Services:   . Active Member of Clubs or Organizations:   . Attends Archivist Meetings:   Marland Kitchen Marital Status:      Family History: The patient's ***family history includes Cancer in his brother and paternal grandfather; Coronary artery disease in his paternal grandmother; Leukemia in his mother; Prostate cancer in his father.  ROS:   Please see the history of present illness.    *** All other systems reviewed and are negative.  EKGs/Labs/Other Studies Reviewed:    The following studies were reviewed today: ***  EKG:  EKG is *** ordered today.  The ekg ordered today demonstrates ***  Recent Labs: 09/28/2019: B Natriuretic Peptide 31.4 12/09/2019: ALT 45; BUN 27; Creatinine, Ser 0.97; Hemoglobin 12.9; Platelets 223; Potassium 4.0; Sodium 139 12/10/2019: TSH 2.723  Recent Lipid Panel    Component Value Date/Time   CHOL 142 12/10/2019 0630   CHOL 123 07/05/2019 0859   CHOL  226 (H) 02/15/2014 0837   TRIG 146 12/10/2019 0630   TRIG 208 (H) 02/15/2014 0837   HDL 41 12/10/2019 0630   HDL 46 07/05/2019 0859   HDL 42 02/15/2014 0837   CHOLHDL 3.5 12/10/2019 0630   VLDL 29 12/10/2019 0630   LDLCALC 72 12/10/2019 0630   LDLCALC 62 07/05/2019 0859   LDLCALC 142 (H) 02/15/2014 0837    Physical Exam:    VS:  There were no vitals taken  for this visit.    Wt Readings from Last 3 Encounters:  10/03/19 194 lb 6.4 oz (88.2 kg)  09/30/19 194 lb 7.1 oz (88.2 kg)  08/01/19 182 lb 8 oz (82.8 kg)     GEN: *** Well nourished, well developed in no acute distress HEENT: Normal NECK: No JVD; No carotid bruits LYMPHATICS: No lymphadenopathy CARDIAC: ***RRR, no murmurs, rubs, gallops RESPIRATORY:  Clear to auscultation without rales, wheezing or rhonchi  ABDOMEN: Soft, non-tender, non-distended MUSCULOSKELETAL:  No edema; No deformity  SKIN: Warm and dry NEUROLOGIC:  Alert and oriented x 3 PSYCHIATRIC:  Normal affect   ASSESSMENT:    No diagnosis found. PLAN:    In order of problems listed above:  No diagnosis found.   Medication Adjustments/Labs and Tests Ordered: Current medicines are reviewed at length with the patient today.  Concerns regarding medicines are outlined above.  No orders of the defined types were placed in this encounter.  No orders of the defined types were placed in this encounter.   Signed, Ledora Bottcher, [place]  12/25/2019 12:14 PM    [facility]

## 2019-12-28 ENCOUNTER — Ambulatory Visit: Payer: Medicaid Other | Admitting: Physician Assistant

## 2020-01-04 ENCOUNTER — Encounter: Payer: Self-pay | Admitting: Cardiology

## 2020-01-04 ENCOUNTER — Other Ambulatory Visit: Payer: Self-pay

## 2020-01-04 ENCOUNTER — Ambulatory Visit: Payer: Medicaid Other | Admitting: Cardiology

## 2020-01-04 VITALS — BP 120/88 | HR 70 | Ht 68.0 in | Wt 192.6 lb

## 2020-01-04 DIAGNOSIS — E1159 Type 2 diabetes mellitus with other circulatory complications: Secondary | ICD-10-CM

## 2020-01-04 DIAGNOSIS — F322 Major depressive disorder, single episode, severe without psychotic features: Secondary | ICD-10-CM | POA: Diagnosis not present

## 2020-01-04 DIAGNOSIS — F323 Major depressive disorder, single episode, severe with psychotic features: Secondary | ICD-10-CM

## 2020-01-04 DIAGNOSIS — Z91041 Radiographic dye allergy status: Secondary | ICD-10-CM

## 2020-01-04 DIAGNOSIS — I2510 Atherosclerotic heart disease of native coronary artery without angina pectoris: Secondary | ICD-10-CM

## 2020-01-04 DIAGNOSIS — I10 Essential (primary) hypertension: Secondary | ICD-10-CM | POA: Diagnosis not present

## 2020-01-04 DIAGNOSIS — F1911 Other psychoactive substance abuse, in remission: Secondary | ICD-10-CM

## 2020-01-04 DIAGNOSIS — Z9861 Coronary angioplasty status: Secondary | ICD-10-CM

## 2020-01-04 DIAGNOSIS — E785 Hyperlipidemia, unspecified: Secondary | ICD-10-CM | POA: Diagnosis not present

## 2020-01-04 DIAGNOSIS — N529 Male erectile dysfunction, unspecified: Secondary | ICD-10-CM

## 2020-01-04 NOTE — Assessment & Plan Note (Signed)
Sildenafil contraindicated with concomitant Nitrate use.

## 2020-01-04 NOTE — Assessment & Plan Note (Signed)
Admitted to [facility] 12/10/2019-12/13/2019

## 2020-01-04 NOTE — Progress Notes (Signed)
Cardiology Office Note:    Date:  01/04/2020   ID:  [name], DOB 01-Mar-1961, MRN [id number]  PCP:  Azzie Glatter, FNP  Cardiologist:  Pixie Casino, MD  Electrophysiologist:  None   Referring MD: Azzie Glatter, FNP   No chief complaint on file. follow up  History of Present Illness:    [name] is a pleasant 58 y.o. male with a hx of of CAD.  The atient has had multiple interventions in the past.  His last intervention was in April 2021.  At that time he had a patent RCA stent, 70% in-stent restenosis in his proximal circumflex stent, 95% OM 2, 50% in-stent restenosis in his mid circumflex stent, 60% mid LAD and a 70% distal circumflex stenosis.  He underwent intervention to the proximal and mid circumflex stent in the OM 2 with a POBA.  His chest pain improved after this.  Other medical issues include hypertension, dyslipidemia on Livalo, non-insulin-dependent diabetes, and history of depression.  He was admitted December 10, 2019 to December 13, 2019 to behavioral health.  He had come off his Remeron.  He has had a past history of cocaine and alcohol abuse and had a recurrence.  He is back on his Remeron and says he is improving.  He denies any chest pain.  He wants to start exercising again, he had stopped going to the gym because of [place].  Past Medical History:  Diagnosis Date  . Allergies   . Arthritis   . Chest pain   . Chronic lower back pain   . Chronic pain of right wrist   . Coronary artery disease    a. Multiple prior caths/PCI. Cath 2013 with possible spasm of RCA, 70% ISR of mid LCx with subsequent DES to mLCx and prox LCX. b. H/o microvascular angina. c. Recurrent angina 08/2014 - s/p PTCA/DES to prox Cx, PTCA/CBA to OM1.  c. LHC 06/10/15 with patent stents and some ISR in LCX and OM-1 that was not flow limiting --> Rx   . Dyslipidemia    a. Intolerant to many statins except tolerating Livalo.  Marland Kitchen GERD (gastroesophageal reflux disease)   . H/O cardiac  catheterization 10/25/2018  . Hypertension   . Myocardial infarction ([place]) ~ 2010  . S/P angioplasty with stent, DES, to proximal and mid LCX 12/15/11 12/15/2011  . Shoulder pain   . Stroke [facility])    pt. reports had a stroke around time of MI 2010  . Type II diabetes mellitus ([place])   . Unstable angina [facility])     Past Surgical History:  Procedure Laterality Date  . CARDIAC CATHETERIZATION  06/15/2002   LAD with prox 40% stenosis, norma L main, Cfx with 25% lesion, RCA with long mid 25% stenosis (Dr. Vita Barley)  . CARDIAC CATHETERIZATION  04/01/2010   normal L main, LAD wit mild stenosis, L Cfx with 70% in-stent restenosis, RCA with 70% in-stent restenosis, LVEF >60% (Dr. K. Mali Hilty) - cutting ballon arthrectomy to RCA & Cfx (Dr. Rockne Menghini)  . CARDIAC CATHETERIZATION  08/25/2010   preserved global LV contractility; multivessel CAD, diffuse 90-95% in-stent restenosis in prox placed Cfx stent - cutting balloon arthrectomy in Cfx with multiple dilatations 90-95% to 0% stenosis (Dr. Corky Downs)  . CARDIAC CATHETERIZATION  01/26/2011   PCI & stenting of aggresive in-stent restenosis within previously stented AV groove Cfx with 3.0x31mm Taxus DES (previous stents were Promus) (Dr. Adora Fridge)  . CARDIAC CATHETERIZATION  05/11/2011  preserved LV function, 40% mid LAD stenosis, 30-40% narrowing proximal to stented semgnet of prox Cfx, patent mid RCA stent with smooth 20% narrowing in distal RCA (Dr. Corky Downs)  . CARDIAC CATHETERIZATION  12/15/2011   PCI & stenting of proximal & mid Cfx with DES - 3.0x15mm in proximal, 3.0x31mm in mid (Dr. Adora Fridge)  . CARDIAC CATHETERIZATION N/A 06/10/2015   Procedure: Left Heart Cath and Coronary Angiography;  Surgeon: Jolaine Artist, MD;  Location: [place] [facility];  Service: Cardiovascular;  Laterality: N/A;  . CARDIAC CATHETERIZATION  10/25/2018  . cardiometabolic testing  10/23/8339   good exercise effort, peak VO2 79% predicted with normal VO2 HR curves (mild  deconditioning)  . COLONOSCOPY  12/2012   diminutive hyperplastic sigmoid poyp so repeat routine 2024  . CORONARY BALLOON ANGIOPLASTY N/A 10/25/2018   Procedure: CORONARY BALLOON ANGIOPLASTY;  Surgeon: Jettie Booze, MD;  Location: [place] [facility];  Service: Cardiovascular;  Laterality: N/A;  . CORONARY BALLOON ANGIOPLASTY N/A 09/29/2019   Procedure: CORONARY BALLOON ANGIOPLASTY;  Surgeon: Nelva Bush, MD;  Location: [place] [facility];  Service: Cardiovascular;  Laterality: N/A;  . EXCISIONAL HEMORRHOIDECTOMY  1984  . INTRAVASCULAR ULTRASOUND/IVUS N/A 09/29/2019   Procedure: Intravascular Ultrasound/IVUS;  Surgeon: Nelva Bush, MD;  Location: [place] [facility];  Service: Cardiovascular;  Laterality: N/A;  . LEFT HEART CATH AND CORONARY ANGIOGRAPHY N/A 10/25/2018   Procedure: LEFT HEART CATH AND CORONARY ANGIOGRAPHY;  Surgeon: Jettie Booze, MD;  Location: [place] [facility];  Service: Cardiovascular;  Laterality: N/A;  . LEFT HEART CATH AND CORONARY ANGIOGRAPHY N/A 09/29/2019   Procedure: LEFT HEART CATH AND CORONARY ANGIOGRAPHY;  Surgeon: Nelva Bush, MD;  Location: [place] [facility];  Service: Cardiovascular;  Laterality: N/A;  . LEFT HEART CATHETERIZATION WITH CORONARY ANGIOGRAM N/A 05/11/2011   Procedure: LEFT HEART CATHETERIZATION WITH CORONARY ANGIOGRAM;  Surgeon: Troy Sine, MD;  Location: [facility] CATH LAB;  Service: Cardiovascular;  Laterality: N/A;  Possible percutaneous coronary intervention, possible IVUS  . LEFT HEART CATHETERIZATION WITH CORONARY ANGIOGRAM N/A 12/15/2011   Procedure: LEFT HEART CATHETERIZATION WITH CORONARY ANGIOGRAM;  Surgeon: Lorretta Harp, MD;  Location: [facility] CATH LAB;  Service: Cardiovascular;  Laterality: N/A;  . LEFT HEART CATHETERIZATION WITH CORONARY ANGIOGRAM N/A 09/05/2014   Procedure: LEFT HEART CATHETERIZATION WITH CORONARY ANGIOGRAM;  Surgeon: Burnell Blanks, MD;  Location: [facility] CATH LAB;  Service: Cardiovascular;   Laterality: N/A;  . LIPOMA EXCISION     back of the head  . NM MYOCAR PERF WALL MOTION  02/2012   lexiscan myoview; mild perfusion defect in mid inferolateral & basal inferolateral region (infarct/scar); EF 52%, abnormal but ow risk scan  . PERCUTANEOUS CORONARY STENT INTERVENTION (PCI-S)  09/05/2014   Procedure: PERCUTANEOUS CORONARY STENT INTERVENTION (PCI-S);  Surgeon: Burnell Blanks, MD;  Location: [facility] CATH LAB;  Service: Cardiovascular;;    Current Medications: Current Meds  Medication Sig  . amLODipine (NORVASC) 10 MG tablet Take 1 tablet (10 mg total) by mouth daily.  Marland Kitchen aspirin 81 MG chewable tablet Chew 1 tablet (81 mg total) by mouth daily.  . bisacodyl (DULCOLAX) 5 MG EC tablet Take 1 tablet (5 mg total) by mouth daily as needed. (May buy from over the counter): For constipation  . isosorbide mononitrate (IMDUR) 60 MG 24 hr tablet Take 1.5 tablets (90 mg total) by mouth daily.  Marland Kitchen lisinopril (ZESTRIL) 10 MG tablet Take 1 tablet (10 mg total) by mouth daily. For high blood  pressure  . metFORMIN (GLUCOPHAGE) 500 MG tablet TAKE 1 TABLET BY MOUTH TWICE DAILY WITH A MEAL  . mirtazapine (REMERON) 15 MG tablet Take 1 tablet (15 mg total) by mouth at bedtime. For depression  . nitroGLYCERIN (NITROSTAT) 0.4 MG SL tablet Place 1 tablet (0.4 mg total) under the tongue every 5 (five) minutes x 3 doses as needed for chest pain.  . Pitavastatin Calcium (LIVALO) 4 MG TABS Take 4 mg by mouth daily. Take 4 mg by mouth daily  . ticagrelor (BRILINTA) 90 MG TABS tablet Take 1 tablet (90 mg total) by mouth 2 (two) times daily.     Allergies:   Bee venom, Shellfish allergy, Statins, and Testosterone cypionate   Social History   Socioeconomic History  . Marital status: Widowed    Spouse name: Not on file  . Number of children: 2  . Years of education: GED  . Highest education level: Not on file  Occupational History  . Not on file  Tobacco Use  . Smoking status: Former Smoker     Packs/day: 1.00    Years: 10.00    Pack years: 10.00    Types: Cigarettes    Quit date: 10/07/2018    Years since quitting: 1.2  . Smokeless tobacco: Never Used  Vaping Use  . Vaping Use: Never used  Substance and Sexual Activity  . Alcohol use: Yes    Comment: 2 bottles of wine on 12/08/2019 (one occurrence)  . Drug use: Yes    Types: Cocaine    Comment: can't specify how much was used on 12/08/2019, "not much"  . Sexual activity: Not Currently  Other Topics Concern  . Not on file  Social History Narrative  . Not on file   Social Determinants of Health   Financial Resource Strain:   . Difficulty of Paying Living Expenses:   Food Insecurity:   . Worried About Charity fundraiser in the Last Year:   . Arboriculturist in the Last Year:   Transportation Needs:   . Film/video editor (Medical):   Marland Kitchen Lack of Transportation (Non-Medical):   Physical Activity:   . Days of Exercise per Week:   . Minutes of Exercise per Session:   Stress:   . Feeling of Stress :   Social Connections:   . Frequency of Communication with Friends and Family:   . Frequency of Social Gatherings with Friends and Family:   . Attends Religious Services:   . Active Member of Clubs or Organizations:   . Attends Archivist Meetings:   Marland Kitchen Marital Status:      Family History: The patient's family history includes Cancer in his brother and paternal grandfather; Coronary artery disease in his paternal grandmother; Leukemia in his mother; Prostate cancer in his father.  ROS:   Please see the history of present illness.    He asked about Viagra and I told him I could not prescribe that since he is on a nitrate  All other systems reviewed and are negative.  EKGs/Labs/Other Studies Reviewed:    The following studies were reviewed today: Cath/PCI April 2021  EKG:  EKG is ordered today.  The ekg ordered today demonstrates NSR-67  Recent Labs: 09/28/2019: B Natriuretic Peptide 31.4 12/09/2019:  ALT 45; BUN 27; Creatinine, Ser 0.97; Hemoglobin 12.9; Platelets 223; Potassium 4.0; Sodium 139 12/10/2019: TSH 2.723  Recent Lipid Panel    Component Value Date/Time   CHOL 142 12/10/2019 0630   CHOL  123 07/05/2019 0859   CHOL 226 (H) 02/15/2014 0837   TRIG 146 12/10/2019 0630   TRIG 208 (H) 02/15/2014 0837   HDL 41 12/10/2019 0630   HDL 46 07/05/2019 0859   HDL 42 02/15/2014 0837   CHOLHDL 3.5 12/10/2019 0630   VLDL 29 12/10/2019 0630   LDLCALC 72 12/10/2019 0630   LDLCALC 62 07/05/2019 0859   LDLCALC 142 (H) 02/15/2014 0837    Physical Exam:    VS:  BP 120/88   Pulse 70   Ht 5\' 8"  (1.727 m)   Wt 192 lb 9.6 oz (87.4 kg)   SpO2 98%   BMI 29.28 kg/m     Wt Readings from Last 3 Encounters:  01/04/20 192 lb 9.6 oz (87.4 kg)  12/10/19 191 lb (86.6 kg)  10/03/19 194 lb 6.4 oz (88.2 kg)     GEN:  Well nourished, well developed AA male in no acute distress HEENT: Normal NECK: No JVD; No carotid bruits CARDIAC: RRR, no murmurs, rubs, gallops RESPIRATORY:  Clear to auscultation without rales, wheezing or rhonchi  ABDOMEN: Soft, non-tender, non-distended MUSCULOSKELETAL:  No edema; No deformity  SKIN: Warm and dry NEUROLOGIC:  Alert and oriented x 3 PSYCHIATRIC:  Normal affect   ASSESSMENT:    CAD S/P percutaneous coronary angioplasty S/P multiple PCI's- last PCI April 2021- POBA to pCFX, mCFX stents and POBA to OM2.  Residula 60% mLAD and 70% mCFX after his stent. Doing well- no angina  Dyslipidemia, goal LDL below 70 H/O statin intolerance- he is on Livalo Last LDL 72 June 2021  Essential hypertension Controlled  MDD (major depressive disorder), single episode, severe with psychotic features ([place]) Admitted to [facility] 12/10/2019-12/13/2019  History of substance abuse ([place]) H/O cocaine, ETOH abuse- relapse in June 2021.  Currently doing well.  ED (erectile dysfunction) Sildenafil contraindicated with concomitant Nitrate use.   PLAN:    He would like to resume  exercising and we discussed this-ease into his cardio routine and light weights, high reps.  I told him I did not think "dry sauna" would be a good idea given his current medications.  F/U Dr Debara Pickett in 6 months.    Medication Adjustments/Labs and Tests Ordered: Current medicines are reviewed at length with the patient today.  Concerns regarding medicines are outlined above.  Orders Placed This Encounter  Procedures  . EKG 12-Lead   No orders of the defined types were placed in this encounter.   Patient Instructions  Medication Instructions:  Your physician recommends that you continue on your current medications as directed. Please refer to the Current Medication list given to you today.  *If you need a refill on your cardiac medications before your next appointment, please call your pharmacy*  Lab Work: NONE ordered at this time of appointment   If you have labs (blood work) drawn today and your tests are completely normal, you will receive your results only by: Marland Kitchen MyChart Message (if you have MyChart) OR . A paper copy in the mail If you have any lab test that is abnormal or we need to change your treatment, we will call you to review the results.  Testing/Procedures: NONE ordered at this time of appointment   Follow-Up: At [facility], you and your health needs are our priority.  As part of our continuing mission to provide you with exceptional heart care, we have created designated Provider Care Teams.  These Care Teams include your primary Cardiologist (physician) and Advanced Practice Providers (APPs -  Physician Assistants and Nurse Practitioners) who all work together to provide you with the care you need, when you need it.  We recommend signing up for the patient portal called "MyChart".  Sign up information is provided on this After Visit Summary.  MyChart is used to connect with patients for Virtual Visits (Telemedicine).  Patients are able to view lab/test results,  encounter notes, upcoming appointments, etc.  Non-urgent messages can be sent to your provider as well.   To learn more about what you can do with MyChart, go to [email or web address].    Your next appointment:   6 month(s)  The format for your next appointment:   In Person  Provider:   K. Mali Hilty, MD  Other Instructions  You may do Light to Moderate cardio, and low weights with high reps      Signed, Kerin Ransom, PA-C  01/04/2020 8:53 AM    [place]

## 2020-01-04 NOTE — Patient Instructions (Addendum)
Medication Instructions:  Your physician recommends that you continue on your current medications as directed. Please refer to the Current Medication list given to you today.  *If you need a refill on your cardiac medications before your next appointment, please call your pharmacy*  Lab Work: NONE ordered at this time of appointment   If you have labs (blood work) drawn today and your tests are completely normal, you will receive your results only by: Marland Kitchen MyChart Message (if you have MyChart) OR . A paper copy in the mail If you have any lab test that is abnormal or we need to change your treatment, we will call you to review the results.  Testing/Procedures: NONE ordered at this time of appointment   Follow-Up: At [facility], you and your health needs are our priority.  As part of our continuing mission to provide you with exceptional heart care, we have created designated Provider Care Teams.  These Care Teams include your primary Cardiologist (physician) and Advanced Practice Providers (APPs -  Physician Assistants and Nurse Practitioners) who all work together to provide you with the care you need, when you need it.  We recommend signing up for the patient portal called "MyChart".  Sign up information is provided on this After Visit Summary.  MyChart is used to connect with patients for Virtual Visits (Telemedicine).  Patients are able to view lab/test results, encounter notes, upcoming appointments, etc.  Non-urgent messages can be sent to your provider as well.   To learn more about what you can do with MyChart, go to [email or web address].    Your next appointment:   6 month(s)  The format for your next appointment:   In Person  Provider:   K. Mali Hilty, MD  Other Instructions  You may do Light to Moderate cardio, and low weights with high reps

## 2020-01-04 NOTE — Assessment & Plan Note (Signed)
H/O statin intolerance- he is on Livalo Last LDL 72 June 2021

## 2020-01-04 NOTE — Assessment & Plan Note (Signed)
S/P multiple PCI's- last PCI April 2021- POBA to pCFX, mCFX stents and POBA to OM2.  Residula 60% mLAD and 70% mCFX after his stent. Doing well- no angina

## 2020-01-04 NOTE — Assessment & Plan Note (Signed)
H/O cocaine, ETOH abuse- relapse in June 2021.  Currently doing well.

## 2020-01-04 NOTE — Assessment & Plan Note (Signed)
Controlled.  

## 2020-02-06 ENCOUNTER — Ambulatory Visit: Payer: Medicaid Other | Admitting: Physician Assistant

## 2020-02-08 ENCOUNTER — Emergency Department (HOSPITAL_COMMUNITY)
Admission: EM | Admit: 2020-02-08 | Discharge: 2020-02-08 | Disposition: A | Discharge status: Home / Self Care | Payer: Medicaid Other | Attending: Emergency Medicine | Admitting: Emergency Medicine

## 2020-02-08 ENCOUNTER — Other Ambulatory Visit: Payer: Self-pay

## 2020-02-08 ENCOUNTER — Emergency Department (HOSPITAL_COMMUNITY): Payer: Medicaid Other

## 2020-02-08 ENCOUNTER — Encounter (HOSPITAL_COMMUNITY): Payer: Self-pay | Admitting: Pharmacy Technician

## 2020-02-08 DIAGNOSIS — Z23 Encounter for immunization: Secondary | ICD-10-CM | POA: Diagnosis not present

## 2020-02-08 DIAGNOSIS — T1490XA Injury, unspecified, initial encounter: Secondary | ICD-10-CM

## 2020-02-08 DIAGNOSIS — Y939 Activity, unspecified: Secondary | ICD-10-CM | POA: Insufficient documentation

## 2020-02-08 DIAGNOSIS — S0081XA Abrasion of other part of head, initial encounter: Secondary | ICD-10-CM | POA: Diagnosis not present

## 2020-02-08 DIAGNOSIS — Y999 Unspecified external cause status: Secondary | ICD-10-CM | POA: Diagnosis not present

## 2020-02-08 DIAGNOSIS — R55 Syncope and collapse: Secondary | ICD-10-CM | POA: Diagnosis not present

## 2020-02-08 DIAGNOSIS — S0993XA Unspecified injury of face, initial encounter: Secondary | ICD-10-CM | POA: Diagnosis present

## 2020-02-08 DIAGNOSIS — Y9289 Other specified places as the place of occurrence of the external cause: Secondary | ICD-10-CM | POA: Insufficient documentation

## 2020-02-08 HISTORY — DX: Acute myocardial infarction, unspecified: I21.9

## 2020-02-08 LAB — BASIC METABOLIC PANEL
Anion gap: 12 (ref 5–15)
BUN: 12 mg/dL (ref 6–20)
CO2: 18 mmol/L — ABNORMAL LOW (ref 22–32)
Calcium: 9.4 mg/dL (ref 8.9–10.3)
Chloride: 107 mmol/L (ref 98–111)
Creatinine, Ser: 0.93 mg/dL (ref 0.61–1.24)
GFR calc Af Amer: >60 mL/min (ref >60)
GFR calc non Af Amer: >60 mL/min (ref >60)
Glucose, Bld: 93 mg/dL (ref 70–99)
Potassium: 4 mmol/L (ref 3.5–5.1)
Sodium: 137 mmol/L (ref 135–145)

## 2020-02-08 LAB — CBC
HCT: 44.4 % (ref 39.0–52.0)
Hemoglobin: 14.5 g/dL (ref 13.0–17.0)
MCH: 28 pg (ref 26.0–34.0)
MCHC: 32.7 g/dL (ref 30.0–36.0)
MCV: 85.9 fL (ref 80.0–100.0)
Platelets: 280 10*3/uL (ref 150–400)
RBC: 5.17 MIL/uL (ref 4.22–5.81)
RDW: 12.5 % (ref 11.5–15.5)
WBC: 5.2 10*3/uL (ref 4.0–10.5)
nRBC: 0 % (ref 0.0–0.2)

## 2020-02-08 LAB — CBG MONITORING, ED: Glucose-Capillary: 91 mg/dL (ref 70–99)

## 2020-02-08 MED ORDER — TETANUS-DIPHTH-ACELL PERTUSSIS 5-2.5-18.5 LF-MCG/0.5 IM SUSP
0.5000 mL | Freq: Once | INTRAMUSCULAR | Status: AC
  Administered 2020-02-08: 0.5 mL via INTRAMUSCULAR
  Filled 2020-02-08: qty 0.5

## 2020-02-08 NOTE — Progress Notes (Signed)
Responded to level 2 fall to support patient and staff. Patient going to CT for scan. No immediate need.  Will follow as needed.   Jaclynn Major, [place], [facility], Pager [phone]

## 2020-02-08 NOTE — ED Notes (Signed)
Pt to CT with TRN  
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
## 2020-02-08 NOTE — ED Notes (Addendum)
Patient Alert and oriented to baseline. Stable and ambulatory to baseline. Patient verbalized understanding of the discharge instructions.  Patient belongings were taken by the patient.   
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
## 2020-02-08 NOTE — ED Notes (Signed)
Pt abrasions cleaned and dressed

## 2020-02-09 ENCOUNTER — Encounter: Payer: Self-pay | Admitting: Cardiology

## 2020-02-16 ENCOUNTER — Telehealth: Payer: Self-pay | Admitting: Internal Medicine

## 2020-02-16 NOTE — Telephone Encounter (Signed)
LVM for patient to return call to get follow up scheduled with [facility] from recall list

## 2020-02-24 ENCOUNTER — Emergency Department (HOSPITAL_COMMUNITY)
Admission: EM | Admit: 2020-02-24 | Discharge: 2020-02-24 | Disposition: A | Discharge status: Home / Self Care | Payer: Medicaid Other | Attending: Emergency Medicine | Admitting: Emergency Medicine

## 2020-02-24 ENCOUNTER — Encounter (HOSPITAL_COMMUNITY): Payer: Self-pay

## 2020-02-24 DIAGNOSIS — Z7141 Alcohol abuse counseling and surveillance of alcoholic: Secondary | ICD-10-CM | POA: Insufficient documentation

## 2020-02-24 DIAGNOSIS — Z5321 Procedure and treatment not carried out due to patient leaving prior to being seen by health care provider: Secondary | ICD-10-CM | POA: Insufficient documentation

## 2020-02-24 DIAGNOSIS — Z0279 Encounter for issue of other medical certificate: Secondary | ICD-10-CM | POA: Insufficient documentation

## 2020-02-24 LAB — ETHANOL: Alcohol, Ethyl (B): <10 mg/dL (ref <10)

## 2020-02-24 LAB — COMPREHENSIVE METABOLIC PANEL
ALT: 36 U/L (ref 0–44)
AST: 25 U/L (ref 15–41)
Albumin: 4.3 g/dL (ref 3.5–5.0)
Alkaline Phosphatase: 49 U/L (ref 38–126)
Anion gap: 11 (ref 5–15)
BUN: 16 mg/dL (ref 6–20)
CO2: 22 mmol/L (ref 22–32)
Calcium: 9.9 mg/dL (ref 8.9–10.3)
Chloride: 108 mmol/L (ref 98–111)
Creatinine, Ser: 1.04 mg/dL (ref 0.61–1.24)
GFR calc Af Amer: >60 mL/min (ref >60)
GFR calc non Af Amer: >60 mL/min (ref >60)
Glucose, Bld: 113 mg/dL — ABNORMAL HIGH (ref 70–99)
Potassium: 3.8 mmol/L (ref 3.5–5.1)
Sodium: 141 mmol/L (ref 135–145)
Total Bilirubin: 0.7 mg/dL (ref 0.3–1.2)
Total Protein: 6.6 g/dL (ref 6.5–8.1)

## 2020-02-24 LAB — CBC
HCT: 41 % (ref 39.0–52.0)
Hemoglobin: 12.9 g/dL — ABNORMAL LOW (ref 13.0–17.0)
MCH: 27.6 pg (ref 26.0–34.0)
MCHC: 31.5 g/dL (ref 30.0–36.0)
MCV: 87.8 fL (ref 80.0–100.0)
Platelets: 240 10*3/uL (ref 150–400)
RBC: 4.67 MIL/uL (ref 4.22–5.81)
RDW: 13.1 % (ref 11.5–15.5)
WBC: 5.7 10*3/uL (ref 4.0–10.5)
nRBC: 0 % (ref 0.0–0.2)

## 2020-02-24 LAB — RAPID URINE DRUG SCREEN, HOSP PERFORMED
Amphetamines: NOT DETECTED
Barbiturates: NOT DETECTED
Benzodiazepines: NOT DETECTED
Cocaine: NOT DETECTED
Opiates: NOT DETECTED
Tetrahydrocannabinol: NOT DETECTED
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
## 2020-02-24 NOTE — ED Triage Notes (Signed)
Pt arrives POV for eval of alcohol treatment. Pt reports that he feels he needs a new antidepressant as well, denies SI/HI. Reports last ETOH intake 3 days ago. Reports he was told to come here for medical clearance

## 2020-03-16 ENCOUNTER — Ambulatory Visit (HOSPITAL_COMMUNITY)
Admission: EM | Admit: 2020-03-16 | Discharge: 2020-03-16 | Discharge status: Absent without leave | Payer: Medicaid Other | Source: Home / Self Care

## 2020-03-16 ENCOUNTER — Ambulatory Visit (HOSPITAL_COMMUNITY)
Admission: AD | Admit: 2020-03-16 | Discharge: 2020-03-16 | Disposition: A | Discharge status: Home / Self Care | Payer: Medicaid Other | Attending: Psychiatry | Admitting: Psychiatry

## 2020-03-16 ENCOUNTER — Ambulatory Visit (HOSPITAL_COMMUNITY)
Admission: EM | Admit: 2020-03-16 | Discharge: 2020-03-17 | Disposition: A | Discharge status: Home / Self Care | Payer: Medicaid Other | Attending: Behavioral Health | Admitting: Behavioral Health

## 2020-03-16 ENCOUNTER — Other Ambulatory Visit: Payer: Self-pay

## 2020-03-16 ENCOUNTER — Encounter (HOSPITAL_COMMUNITY): Payer: Self-pay | Admitting: Emergency Medicine

## 2020-03-16 DIAGNOSIS — I2510 Atherosclerotic heart disease of native coronary artery without angina pectoris: Secondary | ICD-10-CM | POA: Diagnosis not present

## 2020-03-16 DIAGNOSIS — Z87891 Personal history of nicotine dependence: Secondary | ICD-10-CM | POA: Insufficient documentation

## 2020-03-16 DIAGNOSIS — Z20822 Contact with and (suspected) exposure to covid-19: Secondary | ICD-10-CM | POA: Insufficient documentation

## 2020-03-16 DIAGNOSIS — Z915 Personal history of self-harm: Secondary | ICD-10-CM | POA: Insufficient documentation

## 2020-03-16 DIAGNOSIS — T43026A Underdosing of tetracyclic antidepressants, initial encounter: Secondary | ICD-10-CM | POA: Diagnosis not present

## 2020-03-16 DIAGNOSIS — I252 Old myocardial infarction: Secondary | ICD-10-CM | POA: Diagnosis not present

## 2020-03-16 DIAGNOSIS — Z955 Presence of coronary angioplasty implant and graft: Secondary | ICD-10-CM | POA: Diagnosis not present

## 2020-03-16 DIAGNOSIS — Z8673 Personal history of transient ischemic attack (TIA), and cerebral infarction without residual deficits: Secondary | ICD-10-CM | POA: Insufficient documentation

## 2020-03-16 DIAGNOSIS — E119 Type 2 diabetes mellitus without complications: Secondary | ICD-10-CM | POA: Diagnosis not present

## 2020-03-16 DIAGNOSIS — R45851 Suicidal ideations: Secondary | ICD-10-CM | POA: Diagnosis present

## 2020-03-16 DIAGNOSIS — F1020 Alcohol dependence, uncomplicated: Secondary | ICD-10-CM | POA: Diagnosis not present

## 2020-03-16 DIAGNOSIS — F331 Major depressive disorder, recurrent, moderate: Secondary | ICD-10-CM

## 2020-03-16 DIAGNOSIS — I10 Essential (primary) hypertension: Secondary | ICD-10-CM | POA: Insufficient documentation

## 2020-03-16 DIAGNOSIS — Z79899 Other long term (current) drug therapy: Secondary | ICD-10-CM | POA: Insufficient documentation

## 2020-03-16 DIAGNOSIS — Z7982 Long term (current) use of aspirin: Secondary | ICD-10-CM | POA: Insufficient documentation

## 2020-03-16 DIAGNOSIS — F332 Major depressive disorder, recurrent severe without psychotic features: Secondary | ICD-10-CM | POA: Insufficient documentation

## 2020-03-16 DIAGNOSIS — E118 Type 2 diabetes mellitus with unspecified complications: Secondary | ICD-10-CM | POA: Insufficient documentation

## 2020-03-16 DIAGNOSIS — Z7984 Long term (current) use of oral hypoglycemic drugs: Secondary | ICD-10-CM | POA: Insufficient documentation

## 2020-03-16 DIAGNOSIS — Z91128 Patient's intentional underdosing of medication regimen for other reason: Secondary | ICD-10-CM | POA: Diagnosis not present

## 2020-03-16 DIAGNOSIS — E785 Hyperlipidemia, unspecified: Secondary | ICD-10-CM | POA: Insufficient documentation

## 2020-03-16 DIAGNOSIS — R450 Nervousness: Secondary | ICD-10-CM | POA: Insufficient documentation

## 2020-03-16 DIAGNOSIS — Z7901 Long term (current) use of anticoagulants: Secondary | ICD-10-CM | POA: Insufficient documentation

## 2020-03-16 LAB — POCT URINE DRUG SCREEN - MANUAL ENTRY (I-SCREEN)
POC Amphetamine UR: NOT DETECTED
POC Buprenorphine (BUP): NOT DETECTED
POC Cocaine UR: NOT DETECTED
POC Marijuana UR: NOT DETECTED
POC Methadone UR: NOT DETECTED
POC Methamphetamine UR: NOT DETECTED
POC Morphine: NOT DETECTED
POC Oxazepam (BZO): NOT DETECTED
POC Oxycodone UR: NOT DETECTED
POC Secobarbital (BAR): NOT DETECTED

## 2020-03-16 LAB — GLUCOSE, CAPILLARY: Glucose-Capillary: 78 mg/dL (ref 70–99)

## 2020-03-16 LAB — POC SARS CORONAVIRUS 2 AG -  ED: SARS Coronavirus 2 Ag: NEGATIVE

## 2020-03-16 MED ORDER — ALUM & MAG HYDROXIDE-SIMETH 200-200-20 MG/5ML PO SUSP: 30.0000 mL | ORAL | Status: DC | PRN

## 2020-03-16 MED ORDER — LORAZEPAM 1 MG PO TABS
1.0000 mg | ORAL_TABLET | Freq: Once | ORAL | Status: AC
  Administered 2020-03-16: 1 mg via ORAL
  Filled 2020-03-16: qty 1

## 2020-03-16 MED ORDER — ACETAMINOPHEN 325 MG PO TABS: 650.0000 mg | ORAL_TABLET | Freq: Four times a day (QID) | ORAL | Status: DC | PRN

## 2020-03-16 MED ORDER — MAGNESIUM HYDROXIDE 400 MG/5ML PO SUSP: 30.0000 mL | Freq: Every day | ORAL | Status: DC | PRN

## 2020-03-16 MED ORDER — ASPIRIN 81 MG PO CHEW
81.0000 mg | CHEWABLE_TABLET | Freq: Every day | ORAL | Status: DC
  Administered 2020-03-16 – 2020-03-17 (×2): 81 mg via ORAL
  Filled 2020-03-16 (×2): qty 1

## 2020-03-16 MED ORDER — TRAZODONE HCL 50 MG PO TABS
50.0000 mg | ORAL_TABLET | Freq: Every evening | ORAL | Status: DC | PRN
  Administered 2020-03-16: 50 mg via ORAL
  Filled 2020-03-16: qty 1

## 2020-03-16 MED ORDER — METFORMIN HCL 500 MG PO TABS
500.0000 mg | ORAL_TABLET | Freq: Two times a day (BID) | ORAL | Status: DC
  Administered 2020-03-17: 500 mg via ORAL
  Filled 2020-03-16: qty 1

## 2020-03-16 MED ORDER — AMLODIPINE BESYLATE 10 MG PO TABS
10.0000 mg | ORAL_TABLET | Freq: Every day | ORAL | Status: DC
  Administered 2020-03-16 – 2020-03-17 (×2): 10 mg via ORAL
  Filled 2020-03-16 (×2): qty 1

## 2020-03-16 MED ORDER — NITROGLYCERIN 0.4 MG SL SUBL: 0.4000 mg | SUBLINGUAL_TABLET | SUBLINGUAL | Status: DC | PRN

## 2020-03-16 MED ORDER — METOPROLOL TARTRATE 50 MG PO TABS
100.0000 mg | ORAL_TABLET | Freq: Two times a day (BID) | ORAL | Status: DC
  Administered 2020-03-16 – 2020-03-17 (×2): 100 mg via ORAL
  Filled 2020-03-16 (×2): qty 2

## 2020-03-16 NOTE — ED Notes (Signed)
Pt belongings are stored in locker number 25.

## 2020-03-16 NOTE — ED Triage Notes (Signed)
Pt presents with SI, plan to stop taking meds.

## 2020-03-16 NOTE — ED Notes (Signed)
Presents with SI, plan to not take meds.  Admits to previous attempts.  Denies HI or AVH.  Feeling hopeless and worthless.  Pt admits to alcohol & drug use, drinks Gin every day.  Skin search completed.  Pt calm & cooperative, monitoring for safety. No distress noted.

## 2020-03-16 NOTE — BH Assessment (Signed)
Assessment Note  [name] is an 59 y.o. widowed male who presents unaccompanied to [facility] reporting symptoms of depression. Pt states he has a diagnosis of depression and believes he needs to be prescribed different medication. Pt says he is prescribed Mirtazapine and says he is experiencing teeth grinding and bad dreams. He describes his mood as "unbelievably terrible" and acknowledges symptoms including crying spells, social withdrawal, loss of interest in usual pleasures, fatigue, decreased concentration, increased sleep, increased appetite and feelings of worthlessness and hopelessness. He says he stays in bed all day and does not care for his hygiene and grooming. He denies current active suicidal ideation but says he frequently thinks "what's the point?". He says he eats unhealthy food and does not manage his diabetes or take his prescribed medications regularly. He reports a history of suicide attempts including driving his car off a bridge. He reports thoughts in the past of harming a man in his fellowship who borrowed money from Pt and will not pay it back but denies any recent thoughts of harming this person or anyone else. Pt says one year ago he had a gun and plan to shoot this person but was unable to follow through. He denies any plan at this time to harm this individual or anyone else. He denies auditory or visual hallucinations. Pt has a history of using alcohol and cocaine. He denies any recent use.  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 He says being unable to play musical instruments has been very distressing. He states two cousins died from [place] within the past year and two years ago he experienced several deaths, including his wife, mother, brother and nephew. He says he lives alone and receives disability. He identifies his older sister as his primary support. Pt denies history of abuse. He denies legal problems. He denies access to  firearms.  Pt reports he is currently receiving outpatient therapy and medication management through [facility]. He states he sees [name] for therapy but cannot remember the name of his psychiatrist, whom he last saw approximately six weeks ago. He reports a history of several previous psychiatric admissions. He confirms his last psychiatric hospitalization was at [facility] in June 2021.   Pt does not identify anyone to contact for collateral information, stating he spends most of his time alone.   Pt is casually dressed, alert and oriented x4. Pt speaks in a clear tone, at moderate volume and normal pace. Motor behavior appears normal. Eye contact is good. Pt's mood is depressed and affect is congruent with mood. Thought process is coherent and relevant. There is no indication Pt is currently responding to internal stimuli or experiencing delusional thought content. Pt was cooperative throughout assessment. He says he wants to resume psychiatric medications and believes he needs to be admitted to a psychiatric facility.   Diagnosis: F33.2 Major depressive disorder, Recurrent episode, Severe  Past Medical History:  Past Medical History:  Diagnosis Date  . Allergies   . Arthritis   . Chest pain   . Chronic lower back pain   . Chronic pain of right wrist   . Coronary artery disease    a. Multiple prior caths/PCI. Cath 2013 with possible spasm of RCA, 70% ISR of mid LCx with subsequent DES to mLCx and prox LCX. b. H/o microvascular angina. c. Recurrent angina 08/2014 - s/p PTCA/DES to prox Cx, PTCA/CBA to OM1.  c. LHC 06/10/15 with patent stents  and some ISR in LCX and OM-1 that was not flow limiting --> Rx   . Dyslipidemia    a. Intolerant to many statins except tolerating Livalo.  Marland Kitchen GERD (gastroesophageal reflux disease)   . H/O cardiac catheterization 10/25/2018  . Heart attack ([place])   . Hypertension   . Myocardial infarction ([place]) ~ 2010  . S/P angioplasty with stent, DES, to  proximal and mid LCX 12/15/11 12/15/2011  . Shoulder pain   . Stroke [facility])    pt. reports had a stroke around time of MI 2010  . Type II diabetes mellitus ([place])   . Unstable angina [facility])     Past Surgical History:  Procedure Laterality Date  . CARDIAC CATHETERIZATION  06/15/2002   LAD with prox 40% stenosis, norma L main, Cfx with 25% lesion, RCA with long mid 25% stenosis (Dr. Vita Barley)  . CARDIAC CATHETERIZATION  04/01/2010   normal L main, LAD wit mild stenosis, L Cfx with 70% in-stent restenosis, RCA with 70% in-stent restenosis, LVEF >60% (Dr. K. Mali Hilty) - cutting ballon arthrectomy to RCA & Cfx (Dr. Rockne Menghini)  . CARDIAC CATHETERIZATION  08/25/2010   preserved global LV contractility; multivessel CAD, diffuse 90-95% in-stent restenosis in prox placed Cfx stent - cutting balloon arthrectomy in Cfx with multiple dilatations 90-95% to 0% stenosis (Dr. Corky Downs)  . CARDIAC CATHETERIZATION  01/26/2011   PCI & stenting of aggresive in-stent restenosis within previously stented AV groove Cfx with 3.0x75mm Taxus DES (previous stents were Promus) (Dr. Adora Fridge)  . CARDIAC CATHETERIZATION  05/11/2011   preserved LV function, 40% mid LAD stenosis, 30-40% narrowing proximal to stented semgnet of prox Cfx, patent mid RCA stent with smooth 20% narrowing in distal RCA (Dr. Corky Downs)  . CARDIAC CATHETERIZATION  12/15/2011   PCI & stenting of proximal & mid Cfx with DES - 3.0x28mm in proximal, 3.0x82mm in mid (Dr. Adora Fridge)  . CARDIAC CATHETERIZATION N/A 06/10/2015   Procedure: Left Heart Cath and Coronary Angiography;  Surgeon: Jolaine Artist, MD;  Location: [place] [facility];  Service: Cardiovascular;  Laterality: N/A;  . CARDIAC CATHETERIZATION  10/25/2018  . cardiometabolic testing  4/65/0354   good exercise effort, peak VO2 79% predicted with normal VO2 HR curves (mild deconditioning)  . COLONOSCOPY  12/2012   diminutive hyperplastic sigmoid poyp so repeat routine 2024  . CORONARY  BALLOON ANGIOPLASTY N/A 10/25/2018   Procedure: CORONARY BALLOON ANGIOPLASTY;  Surgeon: Jettie Booze, MD;  Location: [place] [facility];  Service: Cardiovascular;  Laterality: N/A;  . CORONARY BALLOON ANGIOPLASTY N/A 09/29/2019   Procedure: CORONARY BALLOON ANGIOPLASTY;  Surgeon: Nelva Bush, MD;  Location: [place] [facility];  Service: Cardiovascular;  Laterality: N/A;  . EXCISIONAL HEMORRHOIDECTOMY  1984  . INTRAVASCULAR ULTRASOUND/IVUS N/A 09/29/2019   Procedure: Intravascular Ultrasound/IVUS;  Surgeon: Nelva Bush, MD;  Location: [place] [facility];  Service: Cardiovascular;  Laterality: N/A;  . LEFT HEART CATH AND CORONARY ANGIOGRAPHY N/A 10/25/2018   Procedure: LEFT HEART CATH AND CORONARY ANGIOGRAPHY;  Surgeon: Jettie Booze, MD;  Location: [place] [facility];  Service: Cardiovascular;  Laterality: N/A;  . LEFT HEART CATH AND CORONARY ANGIOGRAPHY N/A 09/29/2019   Procedure: LEFT HEART CATH AND CORONARY ANGIOGRAPHY;  Surgeon: Nelva Bush, MD;  Location: [place] [facility];  Service: Cardiovascular;  Laterality: N/A;  . LEFT HEART CATHETERIZATION WITH CORONARY ANGIOGRAM N/A 05/11/2011   Procedure: LEFT HEART CATHETERIZATION WITH CORONARY ANGIOGRAM;  Surgeon: Troy Sine, MD;  Location: [place] CATH LAB;  Service: Cardiovascular;  Laterality: N/A;  Possible percutaneous coronary intervention, possible IVUS  . LEFT HEART CATHETERIZATION WITH CORONARY ANGIOGRAM N/A 12/15/2011   Procedure: LEFT HEART CATHETERIZATION WITH CORONARY ANGIOGRAM;  Surgeon: Lorretta Harp, MD;  Location: [facility] CATH LAB;  Service: Cardiovascular;  Laterality: N/A;  . LEFT HEART CATHETERIZATION WITH CORONARY ANGIOGRAM N/A 09/05/2014   Procedure: LEFT HEART CATHETERIZATION WITH CORONARY ANGIOGRAM;  Surgeon: Burnell Blanks, MD;  Location: [facility] CATH LAB;  Service: Cardiovascular;  Laterality: N/A;  . LIPOMA EXCISION     back of the head  . NM MYOCAR PERF WALL MOTION  02/2012   lexiscan myoview; mild  perfusion defect in mid inferolateral & basal inferolateral region (infarct/scar); EF 52%, abnormal but ow risk scan  . PERCUTANEOUS CORONARY STENT INTERVENTION (PCI-S)  09/05/2014   Procedure: PERCUTANEOUS CORONARY STENT INTERVENTION (PCI-S);  Surgeon: Burnell Blanks, MD;  Location: [facility] CATH LAB;  Service: Cardiovascular;;    Family History:  Family History  Problem Relation Age of Onset  . Leukemia Mother   . Prostate cancer Father   . Coronary artery disease Paternal Grandmother   . Cancer Paternal Grandfather   . Cancer Brother     Social History:  reports that he quit smoking about 17 months ago. His smoking use included cigarettes. He has a 10.00 pack-year smoking history. He has never used smokeless tobacco. He reports current alcohol use. He reports current drug use. Drug: Cocaine.  Additional Social History:  Alcohol / Drug Use Pain Medications: Denies abuse Prescriptions: Denies abuse Over the Counter: Denies abuse History of alcohol / drug use?: Yes Longest period of sobriety (when/how long): 4 years Negative Consequences of Use: Financial, Personal relationships  CIWA: CIWA-Ar BP: 115/80 Pulse Rate: 71 COWS:    Allergies:  Allergies  Allergen Reactions  . Bee Venom Anaphylaxis and Hives  . Shellfish Allergy Anaphylaxis and Hives  . Statins Other (See Comments)    Myalgias. Tolerating livalo. Pain   . Statins   . Testosterone Cypionate     Testerone Injection --Increased breast tissue     Home Medications: (Not in a hospital admission)   OB/GYN Status:  No LMP for male patient.  General Assessment Data Location of Assessment:  [facility]) TTS Assessment: In system Is this a Tele or Face-to-Face Assessment?: Face-to-Face Is this an Initial Assessment or a Re-assessment for this encounter?: Initial Assessment Patient Accompanied by:: N/A Language Other than English: No Living Arrangements: Other (Comment) (Lives alone) What gender do you identify  as?: Male Marital status: Widowed [place] name: NA Pregnancy Status: No Living Arrangements: Alone Can pt return to current living arrangement?: Yes Admission Status: Voluntary Is patient capable of signing voluntary admission?: Yes Referral Source: Self/Family/Friend Insurance type: Medicaid  Medical Screening Exam ([place]) Medical Exam completed: Yes Caroline Sauger, NP)  Crisis Care Plan Living Arrangements: Alone Legal Guardian: Other: (Self) Name of Psychiatrist: Beverly Sessions Name of Therapist: Roxan [name]  Education Status Is patient currently in school?: No Is the patient employed, unemployed or receiving disability?: Receiving disability income  Risk to self with the past 6 months Suicidal Ideation: No Has patient been a risk to self within the past 6 months prior to admission? : No Suicidal Intent: No Has patient had any suicidal intent within the past 6 months prior to admission? : No Is patient at risk for suicide?: No Suicidal Plan?: No Has patient had any suicidal plan within the past 6 months prior to  admission? : No Access to Means: No What has been your use of drugs/alcohol within the last 12 months?: Pt has history of using alcohol and cocaine. Denies recent use Previous Attempts/Gestures: Yes How many times?: 2 Other Self Harm Risks: None Triggers for Past Attempts: Unknown Intentional Self Injurious Behavior: None Family Suicide History: Unknown Recent stressful life event(s): Loss (Comment) (Relative have died) Persecutory voices/beliefs?: No Depression: Yes Depression Symptoms: Despondent, Tearfulness, Isolating, Fatigue, Loss of interest in usual pleasures, Feeling worthless/self pity Substance abuse history and/or treatment for substance abuse?: Yes Suicide prevention information given to non-admitted patients: Not applicable  Risk to Others within the past 6 months Homicidal Ideation: No Does patient have any lifetime risk of  violence toward others beyond the six months prior to admission? : No Thoughts of Harm to Others: No Current Homicidal Intent: No Current Homicidal Plan: No Access to Homicidal Means: No Identified Victim: None History of harm to others?: No Assessment of Violence: None Noted Violent Behavior Description: Pt denies history of violence Does patient have access to weapons?: No Criminal Charges Pending?: No Does patient have a court date: No Is patient on probation?: No  Psychosis Hallucinations: None noted Delusions: None noted  Mental Status Report Appearance/Hygiene: Other (Comment) (Casually dressed) Eye Contact: Good Motor Activity: Freedom of movement, Unremarkable Speech: Logical/coherent Level of Consciousness: Alert Mood: Depressed Affect: Depressed Anxiety Level: None Thought Processes: Coherent, Relevant Judgement: Partial Orientation: Person, Place, Time, Situation, Appropriate for developmental age Obsessive Compulsive Thoughts/Behaviors: None  Cognitive Functioning Concentration: Normal Memory: Recent Intact, Remote Intact Is patient IDD: No Insight: Fair Impulse Control: Fair Appetite: Good Have you had any weight changes? : No Change Sleep: Increased Total Hours of Sleep:  (Pt cannot estimate) Vegetative Symptoms: Staying in bed, Decreased grooming, Not bathing  ADLScreening [facility] Assessment Services) Patient's cognitive ability adequate to safely complete daily activities?: Yes Patient able to express need for assistance with ADLs?: Yes Independently performs ADLs?: Yes (appropriate for developmental age)  Prior Inpatient Therapy Prior Inpatient Therapy: Yes Prior Therapy Dates: 11/2019, multiple admits Prior Therapy Facilty/Provider(s): [facility] Reason for Treatment: Depression  Prior Outpatient Therapy Prior Outpatient Therapy: Yes Prior Therapy Dates: Current Prior Therapy Facilty/Provider(s): [facility] Reason for Treatment: Depression Does  patient have an ACCT team?: No Does patient have Intensive In-House Services?  : No Does patient have [facility] services? : Yes Does patient have P4CC services?: No  ADL Screening (condition at time of admission) Patient's cognitive ability adequate to safely complete daily activities?: Yes Is the patient deaf or have difficulty hearing?: No Does the patient have difficulty seeing, even when wearing glasses/contacts?: No Does the patient have difficulty concentrating, remembering, or making decisions?: No Patient able to express need for assistance with ADLs?: Yes Does the patient have difficulty dressing or bathing?: No Independently performs ADLs?: Yes (appropriate for developmental age) Does the patient have difficulty walking or climbing stairs?: No Weakness of Legs: None Weakness of Arms/Hands: None  Home Assistive Devices/Equipment Home Assistive Devices/Equipment: Reacher    Abuse/Neglect Assessment (Assessment to be complete while patient is alone) Abuse/Neglect Assessment Can Be Completed: Yes Physical Abuse: Denies Verbal Abuse: Denies Sexual Abuse: Denies Exploitation of patient/patient's resources: Denies Self-Neglect: Denies     Regulatory affairs officer (For Healthcare) Does Patient Have a Medical Advance Directive?: No Would patient like information on creating a medical advance directive?: No - Patient declined          Disposition: Gave clinical report to Caroline Sauger, NP who completed MSE and  determined Pt meets criteria for inpatient psychiatric treatment. Lavell Luster, [facility], confirmed adult unit is at capacity. Pt agrees to be transported to [facility] for continuous assessment. Notified Miguel Rota, RN of transfer.  Disposition Initial Assessment Completed for this Encounter: Yes  On Site Evaluation by:  Caroline Sauger, NP Reviewed with Physician:    Evelena Peat, [facility], [facility] Triage Specialist [phone]   Anson Fret,  Orpah Greek 03/16/2020 7:53 PM

## 2020-03-17 LAB — COMPREHENSIVE METABOLIC PANEL
ALT: 31 U/L (ref 0–44)
AST: 20 U/L (ref 15–41)
Albumin: 4.4 g/dL (ref 3.5–5.0)
Alkaline Phosphatase: 49 U/L (ref 38–126)
Anion gap: 12 (ref 5–15)
BUN: 20 mg/dL (ref 6–20)
CO2: 22 mmol/L (ref 22–32)
Calcium: 9.6 mg/dL (ref 8.9–10.3)
Chloride: 106 mmol/L (ref 98–111)
Creatinine, Ser: 0.86 mg/dL (ref 0.61–1.24)
GFR calc Af Amer: >60 mL/min (ref >60)
GFR calc non Af Amer: >60 mL/min (ref >60)
Glucose, Bld: 74 mg/dL (ref 70–99)
Potassium: 4 mmol/L (ref 3.5–5.1)
Sodium: 140 mmol/L (ref 135–145)
Total Bilirubin: 0.6 mg/dL (ref 0.3–1.2)
Total Protein: 6.9 g/dL (ref 6.5–8.1)

## 2020-03-17 LAB — SARS CORONAVIRUS 2 BY RT PCR (HOSPITAL ORDER, PERFORMED IN ~~LOC~~ HOSPITAL LAB): SARS Coronavirus 2: NEGATIVE

## 2020-03-17 LAB — CBC WITH DIFFERENTIAL/PLATELET
Abs Immature Granulocytes: 0.03 10*3/uL (ref 0.00–0.07)
Basophils Absolute: 0.1 10*3/uL (ref 0.0–0.1)
Basophils Relative: 1 %
Eosinophils Absolute: 0.5 10*3/uL (ref 0.0–0.5)
Eosinophils Relative: 6 %
HCT: 43.4 % (ref 39.0–52.0)
Hemoglobin: 14 g/dL (ref 13.0–17.0)
Immature Granulocytes: 0 %
Lymphocytes Relative: 30 %
Lymphs Abs: 2.4 10*3/uL (ref 0.7–4.0)
MCH: 28.1 pg (ref 26.0–34.0)
MCHC: 32.3 g/dL (ref 30.0–36.0)
MCV: 87 fL (ref 80.0–100.0)
Monocytes Absolute: 0.8 10*3/uL (ref 0.1–1.0)
Monocytes Relative: 9 %
Neutro Abs: 4.3 10*3/uL (ref 1.7–7.7)
Neutrophils Relative %: 54 %
Platelets: 247 10*3/uL (ref 150–400)
RBC: 4.99 MIL/uL (ref 4.22–5.81)
RDW: 12.8 % (ref 11.5–15.5)
WBC: 8 10*3/uL (ref 4.0–10.5)
nRBC: 0 % (ref 0.0–0.2)

## 2020-03-17 LAB — LIPID PANEL
Cholesterol: 168 mg/dL (ref 0–200)
HDL: 45 mg/dL (ref >40)
LDL Cholesterol: 76 mg/dL (ref 0–99)
Total CHOL/HDL Ratio: 3.7 RATIO
Triglycerides: 237 mg/dL — ABNORMAL HIGH (ref <150)
VLDL: 47 mg/dL — ABNORMAL HIGH (ref 0–40)

## 2020-03-17 LAB — ETHANOL: Alcohol, Ethyl (B): 18 mg/dL — ABNORMAL HIGH (ref <10)

## 2020-03-17 LAB — MAGNESIUM: Magnesium: 1.9 mg/dL (ref 1.7–2.4)

## 2020-03-17 LAB — TSH: TSH: 4.162 u[IU]/mL (ref 0.350–4.500)

## 2020-03-17 LAB — HEMOGLOBIN A1C
Hgb A1c MFr Bld: 6.4 % — ABNORMAL HIGH (ref 4.8–5.6)
Mean Plasma Glucose: 136.98 mg/dL

## 2020-03-17 MED ORDER — SERTRALINE HCL 25 MG PO TABS: 25.0000 mg | ORAL_TABLET | Freq: Every day | ORAL | Status: DC

## 2020-03-17 MED ORDER — MIRTAZAPINE 15 MG PO TABS: 15.0000 mg | ORAL_TABLET | Freq: Every day | ORAL | Status: DC

## 2020-03-17 MED ORDER — HYDROXYZINE HCL 25 MG PO TABS: 25.0000 mg | ORAL_TABLET | Freq: Four times a day (QID) | ORAL | Status: DC | PRN

## 2020-03-17 NOTE — ED Notes (Signed)
Pt sleeping at present, no distress noted, monitoring for safety. 

## 2020-03-17 NOTE — ED Provider Notes (Signed)
FBC/OBS ASAP Discharge Summary  Date and Time: 03/17/2020 12:47 PM  Name: [name]  MRN:  [id number]   Discharge Diagnoses:  Final diagnoses:  MDD (major depressive disorder), recurrent episode, moderate ([place])   12:30_- [name] has requested to be discharged and patient was receptive to follow-up with PHP at [facility] on 03/18/2020.   Evaluation:  [name] was seen and evaluated face-to-face.  Reports " could be better."  Continues to deny suicidal or homicidal ideations.  Denies auditory or visual hallucination.  Does reports symptoms of depression and feels as if his depression is getting worse.  States he was interested in changing his antidepressant however states he has not been consistent with taking mirtazapine.  Discussed initiating Zoloft and patient to consider starting partial hospitalization programming.  As he reports he was followed by therapist however has not made his follow-up appointment in a while.  States he was seeing a Transport planner at [employer].  Patient reports he was helpful to go inpatient to help with his symptoms.  Case staffed with attending psychiatrist Akintayo.  Will observe overnight initiate Zoloft and provide additional outpatient resources.  Patient was receptive to plan.  Support, encouragement and  reassurance was provided.   HPI: [name] is a 59 y.o. a widowed male who presents unaccompanied to [place] reporting symptoms of depression. The patient states he has a diagnosis of depression and believes he needs to be prescribed different medications. He voiced his medications are not working for him. The patient says he has been prescribed Mirtazapine and is experiencing teeth grinding and bad dreams, which he does not take anymore. He describes his mood as "unbelievably terrible" and acknowledges symptoms including crying spells, social withdrawal, loss of interest in usual pleasures, fatigue, decreased concentration, increased sleep, increased  appetite, and feelings of worthlessness and hopelessness. He says he stays in bed all day and does not care for his hygiene and grooming. He denies current active suicidal ideation but says he frequently thinks, "what's the point?". He says he eats unhealthy food and does not manage his diabetes or take his prescribed medications regularly. He reports a history of suicide attempts, including driving his car off a bridge. He says thoughts in the past of harming a man in his fellowship who borrowed money from Pt and will not pay it back but denies any recent thoughts of harming this person or anyone else. The patient said one year ago, he had a gun and planned to shoot this person but could not follow through. He denies any plan at this time to harm this individual or anyone else. He denies auditory or visual hallucinations. The patient has a history of using alcohol and cocaine. He discussed last use of alcohol was on 09.15.98, but cocaine was much longer than that.  Total Time spent with patient: 15 minutes  Past Psychiatric History:  Past Medical History:  Past Medical History:  Diagnosis Date  . Allergies   . Arthritis   . Chest pain   . Chronic lower back pain   . Chronic pain of right wrist   . Coronary artery disease    a. Multiple prior caths/PCI. Cath 2013 with possible spasm of RCA, 70% ISR of mid LCx with subsequent DES to mLCx and prox LCX. b. H/o microvascular angina. c. Recurrent angina 08/2014 - s/p PTCA/DES to prox Cx, PTCA/CBA to OM1.  c. LHC 06/10/15 with patent stents and some ISR in LCX and OM-1 that was not flow  limiting --> Rx   . Dyslipidemia    a. Intolerant to many statins except tolerating Livalo.  Marland Kitchen GERD (gastroesophageal reflux disease)   . H/O cardiac catheterization 10/25/2018  . Heart attack ([place])   . Hypertension   . Myocardial infarction ([place]) ~ 2010  . S/P angioplasty with stent, DES, to proximal and mid LCX 12/15/11 12/15/2011  . Shoulder pain   . Stroke [facility])     pt. reports had a stroke around time of MI 2010  . Type II diabetes mellitus ([place])   . Unstable angina [facility])     Past Surgical History:  Procedure Laterality Date  . CARDIAC CATHETERIZATION  06/15/2002   LAD with prox 40% stenosis, norma L main, Cfx with 25% lesion, RCA with long mid 25% stenosis (Dr. Vita Barley)  . CARDIAC CATHETERIZATION  04/01/2010   normal L main, LAD wit mild stenosis, L Cfx with 70% in-stent restenosis, RCA with 70% in-stent restenosis, LVEF >60% (Dr. K. Mali Hilty) - cutting ballon arthrectomy to RCA & Cfx (Dr. Rockne Menghini)  . CARDIAC CATHETERIZATION  08/25/2010   preserved global LV contractility; multivessel CAD, diffuse 90-95% in-stent restenosis in prox placed Cfx stent - cutting balloon arthrectomy in Cfx with multiple dilatations 90-95% to 0% stenosis (Dr. Corky Downs)  . CARDIAC CATHETERIZATION  01/26/2011   PCI & stenting of aggresive in-stent restenosis within previously stented AV groove Cfx with 3.0x65mm Taxus DES (previous stents were Promus) (Dr. Adora Fridge)  . CARDIAC CATHETERIZATION  05/11/2011   preserved LV function, 40% mid LAD stenosis, 30-40% narrowing proximal to stented semgnet of prox Cfx, patent mid RCA stent with smooth 20% narrowing in distal RCA (Dr. Corky Downs)  . CARDIAC CATHETERIZATION  12/15/2011   PCI & stenting of proximal & mid Cfx with DES - 3.0x16mm in proximal, 3.0x103mm in mid (Dr. Adora Fridge)  . CARDIAC CATHETERIZATION N/A 06/10/2015   Procedure: Left Heart Cath and Coronary Angiography;  Surgeon: Jolaine Artist, MD;  Location: [place] CV LAB;  Service: Cardiovascular;  Laterality: N/A;  . CARDIAC CATHETERIZATION  10/25/2018  . cardiometabolic testing  7/84/6962   good exercise effort, peak VO2 79% predicted with normal VO2 HR curves (mild deconditioning)  . COLONOSCOPY  12/2012   diminutive hyperplastic sigmoid poyp so repeat routine 2024  . CORONARY BALLOON ANGIOPLASTY N/A 10/25/2018   Procedure: CORONARY BALLOON ANGIOPLASTY;   Surgeon: Jettie Booze, MD;  Location: [place] CV LAB;  Service: Cardiovascular;  Laterality: N/A;  . CORONARY BALLOON ANGIOPLASTY N/A 09/29/2019   Procedure: CORONARY BALLOON ANGIOPLASTY;  Surgeon: Nelva Bush, MD;  Location: [place] CV LAB;  Service: Cardiovascular;  Laterality: N/A;  . EXCISIONAL HEMORRHOIDECTOMY  1984  . INTRAVASCULAR ULTRASOUND/IVUS N/A 09/29/2019   Procedure: Intravascular Ultrasound/IVUS;  Surgeon: Nelva Bush, MD;  Location: [place] CV LAB;  Service: Cardiovascular;  Laterality: N/A;  . LEFT HEART CATH AND CORONARY ANGIOGRAPHY N/A 10/25/2018   Procedure: LEFT HEART CATH AND CORONARY ANGIOGRAPHY;  Surgeon: Jettie Booze, MD;  Location: [place] CV LAB;  Service: Cardiovascular;  Laterality: N/A;  . LEFT HEART CATH AND CORONARY ANGIOGRAPHY N/A 09/29/2019   Procedure: LEFT HEART CATH AND CORONARY ANGIOGRAPHY;  Surgeon: Nelva Bush, MD;  Location: [place] CV LAB;  Service: Cardiovascular;  Laterality: N/A;  . LEFT HEART CATHETERIZATION WITH CORONARY ANGIOGRAM N/A 05/11/2011   Procedure: LEFT HEART CATHETERIZATION WITH CORONARY ANGIOGRAM;  Surgeon: Troy Sine, MD;  Location: [facility] CATH LAB;  Service: Cardiovascular;  Laterality: N/A;  Possible percutaneous coronary intervention, possible IVUS  . LEFT HEART CATHETERIZATION WITH CORONARY ANGIOGRAM N/A 12/15/2011   Procedure: LEFT HEART CATHETERIZATION WITH CORONARY ANGIOGRAM;  Surgeon: Lorretta Harp, MD;  Location: [facility] CATH LAB;  Service: Cardiovascular;  Laterality: N/A;  . LEFT HEART CATHETERIZATION WITH CORONARY ANGIOGRAM N/A 09/05/2014   Procedure: LEFT HEART CATHETERIZATION WITH CORONARY ANGIOGRAM;  Surgeon: Burnell Blanks, MD;  Location: [facility] CATH LAB;  Service: Cardiovascular;  Laterality: N/A;  . LIPOMA EXCISION     back of the head  . NM MYOCAR PERF WALL MOTION  02/2012   lexiscan myoview; mild perfusion defect in mid inferolateral & basal inferolateral region (infarct/scar);  EF 52%, abnormal but ow risk scan  . PERCUTANEOUS CORONARY STENT INTERVENTION (PCI-S)  09/05/2014   Procedure: PERCUTANEOUS CORONARY STENT INTERVENTION (PCI-S);  Surgeon: Burnell Blanks, MD;  Location: [facility] CATH LAB;  Service: Cardiovascular;;   Family History:  Family History  Problem Relation Age of Onset  . Leukemia Mother   . Prostate cancer Father   . Coronary artery disease Paternal Grandmother   . Cancer Paternal Grandfather   . Cancer Brother    Family Psychiatric History:  Social History:  Social History   Substance and Sexual Activity  Alcohol Use Yes   Comment: 2 bottles of wine on 12/08/2019 (one occurrence)     Social History   Substance and Sexual Activity  Drug Use Yes  . Types: Cocaine   Comment: can't specify how much was used on 12/08/2019, "not much"    Social History   Socioeconomic History  . Marital status: Unknown    Spouse name: Not on file  . Number of children: 2  . Years of education: GED  . Highest education level: Not on file  Occupational History  . Not on file  Tobacco Use  . Smoking status: Former Smoker    Packs/day: 1.00    Years: 10.00    Pack years: 10.00    Types: Cigarettes    Quit date: 10/07/2018    Years since quitting: 1.4  . Smokeless tobacco: Never Used  Vaping Use  . Vaping Use: Never used  Substance and Sexual Activity  . Alcohol use: Yes    Comment: 2 bottles of wine on 12/08/2019 (one occurrence)  . Drug use: Yes    Types: Cocaine    Comment: can't specify how much was used on 12/08/2019, "not much"  . Sexual activity: Not Currently  Other Topics Concern  . Not on file  Social History Narrative   ** Merged History Encounter **       Social Determinants of Health   Financial Resource Strain:   . Difficulty of Paying Living Expenses: Not on file  Food Insecurity:   . Worried About Charity fundraiser in the Last Year: Not on file  . Ran Out of Food in the Last Year: Not on file  Transportation Needs:    . Lack of Transportation (Medical): Not on file  . Lack of Transportation (Non-Medical): Not on file  Physical Activity:   . Days of Exercise per Week: Not on file  . Minutes of Exercise per Session: Not on file  Stress:   . Feeling of Stress : Not on file  Social Connections:   . Frequency of Communication with Friends and Family: Not on file  . Frequency of Social Gatherings with Friends and Family: Not on file  . Attends Religious Services: Not on file  . Active Member of  Clubs or Organizations: Not on file  . Attends Archivist Meetings: Not on file  . Marital Status: Not on file   SDOH:  SDOH Screenings   Alcohol Screen: Medium Risk  . Last Alcohol Screening Score (AUDIT): 13  Depression (PHQ2-9): Low Risk   . PHQ-2 Score: 0  Financial Resource Strain:   . Difficulty of Paying Living Expenses: Not on file  Food Insecurity:   . Worried About Charity fundraiser in the Last Year: Not on file  . Ran Out of Food in the Last Year: Not on file  Housing:   . Last Housing Risk Score: Not on file  Physical Activity:   . Days of Exercise per Week: Not on file  . Minutes of Exercise per Session: Not on file  Social Connections:   . Frequency of Communication with Friends and Family: Not on file  . Frequency of Social Gatherings with Friends and Family: Not on file  . Attends Religious Services: Not on file  . Active Member of Clubs or Organizations: Not on file  . Attends Archivist Meetings: Not on file  . Marital Status: Not on file  Stress:   . Feeling of Stress : Not on file  Tobacco Use: Medium Risk  . Smoking Tobacco Use: Former Smoker  . Smokeless Tobacco Use: Never Used  Transportation Needs:   . Film/video editor (Medical): Not on file  . Lack of Transportation (Non-Medical): Not on file    Has this patient used any form of tobacco in the last 30 days? (Cigarettes, Smokeless Tobacco, Cigars, and/or Pipes) A prescription for an  FDA-approved tobacco cessation medication was offered at discharge and the patient refused  Current Medications:  Current Facility-Administered Medications  Medication Dose Route Frequency Provider Last Rate Last Admin  . acetaminophen (TYLENOL) tablet 650 mg  650 mg Oral Q6H PRN Caroline Sauger, NP      . alum & mag hydroxide-simeth (MAALOX/MYLANTA) 200-200-20 MG/5ML suspension 30 mL  30 mL Oral Q4H PRN Caroline Sauger, NP      . amLODipine (NORVASC) tablet 10 mg  10 mg Oral Daily Caroline Sauger, NP   10 mg at 03/17/20 0953  . aspirin chewable tablet 81 mg  81 mg Oral Daily Caroline Sauger, NP   81 mg at 03/17/20 0953  . hydrOXYzine (ATARAX/VISTARIL) tablet 25 mg  25 mg Oral Q6H PRN Derrill Center, NP      . magnesium hydroxide (MILK OF MAGNESIA) suspension 30 mL  30 mL Oral Daily PRN Caroline Sauger, NP      . metFORMIN (GLUCOPHAGE) tablet 500 mg  500 mg Oral BID WC Caroline Sauger, NP   500 mg at 03/17/20 0745  . metoprolol tartrate (LOPRESSOR) tablet 100 mg  100 mg Oral BID Caroline Sauger, NP   100 mg at 03/17/20 0953  . mirtazapine (REMERON) tablet 15 mg  15 mg Oral QHS Derrill Center, NP      . nitroGLYCERIN (NITROSTAT) SL tablet 0.4 mg  0.4 mg Sublingual Q5 min PRN Caroline Sauger, NP      . sertraline (ZOLOFT) tablet 25 mg  25 mg Oral Daily Derrill Center, NP       Current Outpatient Medications  Medication Sig Dispense Refill  . co-enzyme Q-10 50 MG capsule Take 50 mg by mouth daily.    Marland Kitchen amLODipine (NORVASC) 10 MG tablet Take 1 tablet (10 mg total) by mouth daily. 90 tablet 2  . metFORMIN (GLUCOPHAGE)  500 MG tablet TAKE 1 TABLET BY MOUTH TWICE DAILY WITH A MEAL 180 tablet 2  . nitroGLYCERIN (NITROSTAT) 0.4 MG SL tablet Place 1 tablet (0.4 mg total) under the tongue every 5 (five) minutes x 3 doses as needed for chest pain. 25 tablet 12  . Pitavastatin Calcium (LIVALO) 4 MG TABS Take 4 mg by mouth daily. Take 4 mg by mouth daily    .  ticagrelor (BRILINTA) 90 MG TABS tablet Take 1 tablet (90 mg total) by mouth 2 (two) times daily. 180 tablet 2    PTA Medications: (Not in a hospital admission)   Musculoskeletal  Strength & Muscle Tone: within normal limits Gait & Station: normal Patient leans: N/A  Psychiatric Specialty Exam  Presentation  General Appearance: Appropriate for Environment  Eye Contact:Good  Speech:Clear and Coherent  Speech Volume:Normal  Handedness:Right   Mood and Affect  Mood:Depressed  Affect:Congruent   Thought Process  Thought Processes:Coherent  Descriptions of Associations:Intact  Orientation:Full (Time, Place and Person)  Thought Content:Logical  Hallucinations:Hallucinations: None  Ideas of Reference:None  Suicidal Thoughts:Suicidal Thoughts: No SI Passive Intent and/or Plan: With Plan;With Means to Carry Out;With Intent;With Access to Means  Homicidal Thoughts:Homicidal Thoughts: No   Sensorium  Memory:Recent Good  Judgment:Good  Insight:Good   Executive Functions  Concentration:Good  Attention Span:Fair  [place]  Language:Good   Psychomotor Activity  Psychomotor Activity:Psychomotor Activity: Normal   Assets  Assets:Communication Skills;Social Support;Financial Resources/Insurance   Sleep  Sleep:Sleep: Fair Number of Hours of Sleep: 8   Physical Exam  Physical Exam Vitals reviewed.  Neurological:     Mental Status: He is alert.  Psychiatric:        Mood and Affect: Mood normal.    Review of Systems  Psychiatric/Behavioral: Positive for depression. Negative for suicidal ideas. The patient is nervous/anxious.   All other systems reviewed and are negative.  Blood pressure (!) 125/96, pulse (!) 56, temperature 97.7 F (36.5 C), temperature source Temporal, resp. rate 18, height 5\' 8"  (1.727 m), weight 185 lb (83.9 kg), SpO2 99 %. Body mass index is 28.13 kg/m.  Demographic Factors:  Male  Loss  Factors: Financial problems/change in socioeconomic status  Historical Factors: Family history of mental illness or substance abuse  Risk Reduction Factors:   Positive social support  Continued Clinical Symptoms:  Depression:   Comorbid alcohol abuse/dependence  Cognitive Features That Contribute To Risk:  Closed-mindedness    Suicide Risk:  Minimal: No identifiable suicidal ideation.  Patients presenting with no risk factors but with morbid ruminations; may be classified as minimal risk based on the severity of the depressive symptoms  Plan Of Care/Follow-up recommendations:  Activity:  as tolerated Diet:  heart healthy  Disposition:  -Keep follow-up with PHP @[facility]   Take all medications as prescribed. Keep all follow-up appointments as scheduled.  Do not consume alcohol or use illegal drugs while on prescription medications. Report any adverse effects from your medications to your primary care provider promptly.  In the event of recurrent symptoms or worsening symptoms, call 911, a crisis hotline, or go to the nearest emergency department for evaluation.   Derrill Center, NP 03/17/2020, 12:47 PM

## 2020-03-17 NOTE — ED Provider Notes (Signed)
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 Average Risk  23.4   11.0        Use the calculated Patient Ratio above and the CHD Risk Table to determine the patient's CHD Risk.        ATP III CLASSIFICATION (LDL):  <100     mg/dL   Optimal  100-129  mg/dL   Near or Above                    Optimal  130-159  mg/dL   Borderline  160-189  mg/dL   High  >190     mg/dL   Very High Performed at [facility] [address] [place], [place] [address]   . TSH 12/10/2019 2.723  0.350 - 4.500 uIU/mL Final    Comment: Performed by a 3rd Generation assay with a functional sensitivity of <=0.01 uIU/mL. Performed at [facility], [place] [address] [place], [place] [address]   . Hgb A1c MFr Bld 12/12/2019 6.9* 4.8 - 5.6 % Final   Comment: (NOTE) Pre diabetes:          5.7%-6.4%  Diabetes:              >6.4%  Glycemic control for   <7.0% adults with diabetes   . Mean Plasma Glucose 12/12/2019 151.33  mg/dL Final   Performed at [facility] [address] [place], [place] [address]  . Glucose-Capillary 12/12/2019 119* 70 - 99 mg/dL Final   Glucose reference range applies only to samples taken after fasting for at least 8 hours.  . Glucose-Capillary 12/13/2019 118* 70 - 99 mg/dL Final   Glucose reference range applies only to samples taken after fasting for at least 8 hours.  . Comment 1 12/13/2019 Notify RN   Final  . Comment 2 12/13/2019 Document in Chart   Final  Admission on 12/09/2019, Discharged on 12/10/2019  Component Date Value Ref Range Status  . SARS Coronavirus 2 12/09/2019 NEGATIVE  NEGATIVE Final   Comment: (NOTE) SARS-CoV-2 target nucleic acids are NOT DETECTED.  The SARS-CoV-2 RNA is generally detectable in upper and lower respiratory specimens during the acute phase of infection. The lowest concentration of SARS-CoV-2 viral copies this assay can detect is 250 copies / mL. A negative result does not preclude SARS-CoV-2 infection and should not be used as the sole basis for treatment or other patient management decisions.  A negative result may occur with improper specimen collection / handling, submission of specimen other than nasopharyngeal swab, presence of viral mutation(s) within the areas targeted by this assay, and inadequate number of viral copies (<250 copies / mL). A negative result must be combined with clinical observations, patient history, and epidemiological information.  Fact Sheet for Patients:    StrictlyIdeas.no  Fact Sheet for Healthcare Providers: BankingDealers.co.za  This test is not yet approved or                           cleared by the [place] FDA and has been authorized for detection and/or diagnosis of SARS-CoV-2 by FDA under an Emergency Use Authorization (EUA).  This EUA will remain in effect (meaning this test can be used) for the duration of the COVID-19 declaration under Section 564(b)(1) of the Act, 21 U.S.C. section 360bbb-3(b)(1), unless the authorization is terminated or revoked sooner.  Performed at [facility], [place] [address], [place], [place] [address]   .  Sodium 12/09/2019 139  135 - 145 mmol/L Final  . Potassium 12/09/2019 4.0  3.5 - 5.1 mmol/L Final  . Chloride 12/09/2019 106  98 - 111 mmol/L Final  . CO2 12/09/2019 23  22 - 32 mmol/L Final  . Glucose, Bld 12/09/2019 94  70 - 99 mg/dL Final   Glucose reference range applies only to samples taken after fasting for at least 8 hours.  . BUN 12/09/2019 27* 6 - 20 mg/dL Final  . Creatinine, Ser 12/09/2019 0.97  0.61 - 1.24 mg/dL Final  . Calcium 12/09/2019 8.9  8.9 - 10.3 mg/dL Final  . Total Protein 12/09/2019 6.9  6.5 - 8.1 g/dL Final  . Albumin 12/09/2019 4.5  3.5 - 5.0 g/dL Final  . AST 12/09/2019 51* 15 - 41 U/L Final  . ALT 12/09/2019 45* 0 - 44 U/L Final  . Alkaline Phosphatase 12/09/2019 51  38 - 126 U/L Final  . Total Bilirubin 12/09/2019 0.7  0.3 - 1.2 mg/dL Final  . GFR calc non Af Amer 12/09/2019 >60  >60 mL/min Final  . GFR calc Af Amer 12/09/2019 >60  >60 mL/min Final  . Anion gap 12/09/2019 10  5 - 15 Final   Performed at [facility], [place] [address] [place], [place] [address]  . Alcohol, Ethyl (B) 12/09/2019 <10  <10 mg/dL Final   Comment: (NOTE) Lowest detectable limit for serum alcohol is 10 mg/dL.  For medical purposes only. Performed at [facility], [place] [address] [place], [place] [address]   . Salicylate Lvl 91/63/8466 <7.0* 7.0 - 30.0 mg/dL Final   Performed at [facility] [address], [place], [place] [address]  . Acetaminophen (Tylenol), Serum 12/09/2019 <10* 10 - 30 ug/mL Final   Comment: (NOTE) Therapeutic concentrations vary significantly. A range of 10-30 ug/mL  may be an effective concentration for many patients. However, some  are best treated at concentrations outside of this range. Acetaminophen concentrations >150 ug/mL at 4 hours after ingestion  and >50 ug/mL at 12 hours after ingestion are often associated with  toxic reactions.  Performed at [facility], [place] [address], [name], [place] [address]   . WBC 12/09/2019 7.4  4.0 - 10.5 K/uL Final  . RBC 12/09/2019 4.45  4.22 - 5.81 MIL/uL Final  . Hemoglobin 12/09/2019 12.9* 13.0 - 17.0 g/dL Final  . HCT 12/09/2019 38.5* 39 - 52 % Final  . MCV 12/09/2019 86.5  80.0 - 100.0 fL Final  . MCH 12/09/2019 29.0  26.0 - 34.0 pg Final  . MCHC 12/09/2019 33.5  30.0 - 36.0 g/dL Final  . RDW 12/09/2019 12.7  11.5 - 15.5 % Final  . Platelets 12/09/2019 223  150 - 400 K/uL Final  . nRBC 12/09/2019 0.0  0.0 - 0.2 % Final   Performed at [facility], [place] [address] [place], [place] [address]  . Opiates 12/09/2019 NONE DETECTED  NONE DETECTED Final  . Cocaine 12/09/2019 POSITIVE* NONE DETECTED Final  . Benzodiazepines 12/09/2019 NONE DETECTED  NONE DETECTED Final  . Amphetamines 12/09/2019 NONE DETECTED  NONE DETECTED Final  . Tetrahydrocannabinol 12/09/2019 NONE DETECTED  NONE DETECTED Final  . Barbiturates 12/09/2019 NONE DETECTED  NONE DETECTED Final   Comment: (NOTE) DRUG SCREEN FOR MEDICAL PURPOSES ONLY.  IF CONFIRMATION IS NEEDED FOR ANY PURPOSE, NOTIFY LAB WITHIN 5 DAYS.  LOWEST DETECTABLE LIMITS FOR URINE DRUG SCREEN Drug Class  Cutoff (ng/mL) Amphetamine and metabolites    1000 Barbiturate and  metabolites    200 Benzodiazepine                 700 Tricyclics and metabolites     300 Opiates and metabolites        300 Cocaine and metabolites        300 THC                            50 Performed at [facility], [place] [address], [place], [place] [address]   Office Visit on 10/03/2019  Component Date Value Ref Range Status  . Glucose, UA 10/03/2019 Negative  Negative Final  . Bilirubin, UA 10/03/2019 Negative   Final  . Ketones, UA 10/03/2019 Negative   Final  . Spec Grav, UA 10/03/2019 <=1.005* 1.010 - 1.025 Final  . Blood, UA 10/03/2019 Negative   Final  . pH, UA 10/03/2019 6.0  5.0 - 8.0 Final  . Protein, UA 10/03/2019 Negative  Negative Final  . Urobilinogen, UA 10/03/2019 0.2  0.2 or 1.0 E.U./dL Final  . Nitrite, UA 10/03/2019 Negative   Final  . Leukocytes, UA 10/03/2019 Negative  Negative Final  . POC Glucose 10/03/2019 206* 70 - 99 mg/dl Final  Admission on 09/28/2019, Discharged on 09/30/2019  Component Date Value Ref Range Status  . Sodium 09/28/2019 140  135 - 145 mmol/L Final  . Potassium 09/28/2019 4.4  3.5 - 5.1 mmol/L Final  . Chloride 09/28/2019 108  98 - 111 mmol/L Final  . CO2 09/28/2019 23  22 - 32 mmol/L Final  . Glucose, Bld 09/28/2019 161* 70 - 99 mg/dL Final   Glucose reference range applies only to samples taken after fasting for at least 8 hours.  . BUN 09/28/2019 15  6 - 20 mg/dL Final  . Creatinine, Ser 09/28/2019 0.79  0.61 - 1.24 mg/dL Final  . Calcium 09/28/2019 9.3  8.9 - 10.3 mg/dL Final  . GFR calc non Af Amer 09/28/2019 >60  >60 mL/min Final  . GFR calc Af Amer 09/28/2019 >60  >60 mL/min Final  . Anion gap 09/28/2019 9  5 - 15 Final   Performed at [facility], [place] [address], [place], [place] [address]  . WBC 09/28/2019 5.8  4.0 - 10.5 K/uL Final  . RBC 09/28/2019 5.04  4.22 - 5.81 MIL/uL Final  . Hemoglobin 09/28/2019 14.3  13.0 - 17.0 g/dL Final  . HCT 09/28/2019 44.8  39 - 52 % Final  . MCV 09/28/2019 88.9   80.0 - 100.0 fL Final  . MCH 09/28/2019 28.4  26.0 - 34.0 pg Final  . MCHC 09/28/2019 31.9  30.0 - 36.0 g/dL Final  . RDW 09/28/2019 12.2  11.5 - 15.5 % Final  . Platelets 09/28/2019 252  150 - 400 K/uL Final  . nRBC 09/28/2019 0.0  0.0 - 0.2 % Final   Performed at [facility], [place] [address] [place] [address] [place], [place] [address]  . Troponin I (High Sensitivity) 09/28/2019 8  <18 ng/L Final   Comment: (NOTE) Elevated high sensitivity troponin I (hsTnI) values and significant  changes across serial measurements may suggest ACS but many other  chronic and acute conditions are known to elevate hsTnI results.  Refer to the "Links" section for chest pain algorithms and additional  guidance. Performed at [facility], [place] [address], [place], [place] [address]   .  B Natriuretic Peptide 09/28/2019 31.4  0.0 - 100.0 pg/mL Final   Performed at [facility], [place] [address], [place], [place] [address]  . Troponin I (High Sensitivity) 09/28/2019 8  <18 ng/L Final   Comment: (NOTE) Elevated high sensitivity troponin I (hsTnI) values and significant  changes across serial measurements may suggest ACS but many other  chronic and acute conditions are known to elevate hsTnI results.  Refer to the "Links" section for chest pain algorithms and additional  guidance. Performed at [facility], [place] [address] [place], [place] [address]   . Creatinine, Ser 09/28/2019 0.74  0.61 - 1.24 mg/dL Final  . GFR calc non Af Amer 09/28/2019 >60  >60 mL/min Final  . GFR calc Af Amer 09/28/2019 >60  >60 mL/min Final   Performed at [facility], [place] [address] [place], [place] [address]  . Weight 09/29/2019 3,072.33  oz Final  . Height 09/29/2019 68  in Final  . BP 09/29/2019 105/79  mmHg Final  . SARS Coronavirus 2 09/28/2019 NEGATIVE  NEGATIVE Final   Comment: (NOTE) SARS-CoV-2 target nucleic acids are NOT DETECTED. The SARS-CoV-2 RNA is generally detectable in upper and  lower respiratory specimens during the acute phase of infection. Negative results do not preclude SARS-CoV-2 infection, do not rule out co-infections with other pathogens, and should not be used as the sole basis for treatment or other patient management decisions. Negative results must be combined with clinical observations, patient history, and epidemiological information. The expected result is Negative. Fact Sheet for Patients: SugarRoll.be Fact Sheet for Healthcare Providers: https://www.woods-mathews.com/ This test is not yet approved or cleared by the [place] FDA and  has been authorized for detection and/or diagnosis of SARS-CoV-2 by FDA under an Emergency Use Authorization (EUA). This EUA will remain  in effect (meaning this test can be used) for the duration of the COVID-19 declaration under Section 56                          4(b)(1) of the Act, 21 U.S.C. section 360bbb-3(b)(1), unless the authorization is terminated or revoked sooner. Performed at [facility], [place] [address] [place], [place] [address]   . Sodium 09/29/2019 141  135 - 145 mmol/L Final  . Potassium 09/29/2019 4.0  3.5 - 5.1 mmol/L Final  . Chloride 09/29/2019 107  98 - 111 mmol/L Final  . CO2 09/29/2019 24  22 - 32 mmol/L Final  . Glucose, Bld 09/29/2019 162* 70 - 99 mg/dL Final   Glucose reference range applies only to samples taken after fasting for at least 8 hours.  . BUN 09/29/2019 13  6 - 20 mg/dL Final  . Creatinine, Ser 09/29/2019 0.82  0.61 - 1.24 mg/dL Final  . Calcium 09/29/2019 9.0  8.9 - 10.3 mg/dL Final  . GFR calc non Af Amer 09/29/2019 >60  >60 mL/min Final  . GFR calc Af Amer 09/29/2019 >60  >60 mL/min Final  . Anion gap 09/29/2019 10  5 - 15 Final   Performed at [facility], [place] [address] [place], [place] [address]  . WBC 09/28/2019 6.4  4.0 - 10.5 K/uL Final  . RBC 09/28/2019 4.74  4.22 - 5.81 MIL/uL Final  . Hemoglobin  09/28/2019 13.5  13.0 - 17.0 g/dL Final  . HCT 09/28/2019 41.6  39 - 52 % Final  . MCV 09/28/2019 87.8  80.0 - 100.0 fL Final  .  MCH 09/28/2019 28.5  26.0 - 34.0 pg Final  . MCHC 09/28/2019 32.5  30.0 - 36.0 g/dL Final  . RDW 09/28/2019 12.3  11.5 - 15.5 % Final  . Platelets 09/28/2019 233  150 - 400 K/uL Final  . nRBC 09/28/2019 0.0  0.0 - 0.2 % Final   Performed at [facility], [place] [address] [place], [place] [address]  . Glucose-Capillary 09/28/2019 182* 70 - 99 mg/dL Final   Glucose reference range applies only to samples taken after fasting for at least 8 hours.  . Comment 1 09/28/2019 Notify RN   Final  . Comment 2 09/28/2019 Document in Chart   Final  . Glucose-Capillary 09/29/2019 158* 70 - 99 mg/dL Final   Glucose reference range applies only to samples taken after fasting for at least 8 hours.  . Glucose-Capillary 09/29/2019 129* 70 - 99 mg/dL Final   Glucose reference range applies only to samples taken after fasting for at least 8 hours.  . Activated Clotting Time 09/29/2019 257  seconds Final  . Activated Clotting Time 09/29/2019 285  seconds Final  . Activated Clotting Time 09/29/2019 279  seconds Final  . Sodium 09/30/2019 136  135 - 145 mmol/L Final  . Potassium 09/30/2019 4.2  3.5 - 5.1 mmol/L Final  . Chloride 09/30/2019 105  98 - 111 mmol/L Final  . CO2 09/30/2019 22  22 - 32 mmol/L Final  . Glucose, Bld 09/30/2019 263* 70 - 99 mg/dL Final   Glucose reference range applies only to samples taken after fasting for at least 8 hours.  . BUN 09/30/2019 15  6 - 20 mg/dL Final  . Creatinine, Ser 09/30/2019 0.84  0.61 - 1.24 mg/dL Final  . Calcium 09/30/2019 8.8* 8.9 - 10.3 mg/dL Final  . GFR calc non Af Amer 09/30/2019 >60  >60 mL/min Final  . GFR calc Af Amer 09/30/2019 >60  >60 mL/min Final  . Anion gap 09/30/2019 9  5 - 15 Final   Performed at [facility], [place] [address], [place], [place] [address]  . WBC 09/30/2019 6.9  4.0 - 10.5 K/uL Final  . RBC  09/30/2019 4.22  4.22 - 5.81 MIL/uL Final  . Hemoglobin 09/30/2019 12.2* 13.0 - 17.0 g/dL Final  . HCT 09/30/2019 37.5* 39 - 52 % Final  . MCV 09/30/2019 88.9  80.0 - 100.0 fL Final  . MCH 09/30/2019 28.9  26.0 - 34.0 pg Final  . MCHC 09/30/2019 32.5  30.0 - 36.0 g/dL Final  . RDW 09/30/2019 12.3  11.5 - 15.5 % Final  . Platelets 09/30/2019 216  150 - 400 K/uL Final  . nRBC 09/30/2019 0.0  0.0 - 0.2 % Final   Performed at [facility], [place] [address], [place],  [address]  . Glucose-Capillary 09/29/2019 206* 70 - 99 mg/dL Final   Glucose reference range applies only to samples taken after fasting for at least 8 hours.  . Glucose-Capillary 09/29/2019 177* 70 - 99 mg/dL Final   Glucose reference range applies only to samples taken after fasting for at least 8 hours.  . Comment 1 09/29/2019 Notify RN   Final  . Comment 2 09/29/2019 Document in Chart   Final  . Glucose-Capillary 09/30/2019 225* 70 - 99 mg/dL Final   Glucose reference range applies only to samples taken after fasting for at least 8 hours.    Allergies: Bee venom, Shellfish allergy, Statins, Statins, and Testosterone cypionate  PTA Medications: (Not in  a hospital admission)   Medical Decision Making  The patient is a safety risk to self and currently is requiring psychiatric inpatient admission for stabilization and treatment.  Recommendations  Based on my evaluation the patient does not appear to have an emergency medical condition.  Caroline Sauger, NP 03/17/20  1:50 AM

## 2020-03-17 NOTE — ED Notes (Signed)
Patient offered breakfast;  Given cereal, muffin

## 2020-03-17 NOTE — Discharge Instructions (Addendum)
Take all medications as prescribed. Keep all follow-up appointments as scheduled.  Do not consume alcohol or use illegal drugs while on prescription medications. Report any adverse effects from your medications to your primary care provider promptly.  In the event of recurrent symptoms or worsening symptoms, call 911, a crisis hotline, or go to the nearest emergency department for evaluation.   

## 2020-03-17 NOTE — ED Notes (Signed)
Pt alert and oriented on the unit and sitting up on his chair-bed reading a magazine. Pt denies SI/HI, A/VH, and any pain. Pt is pleasant and cooperative. Education, support and encouragement provided. Medications administered per MD orders. No reactions/side effects to medicine noted. Pt denies any concerns at this time, and verbally contracts for safety.Pt ambulating on the unit with no issues. Pt remains safe on the unit.

## 2020-03-17 NOTE — ED Provider Notes (Signed)
Behavioral Health Progress Note  Date and Time: 03/17/2020 11:01 AM Name: [name] MRN:  [id number]     Evaluation:  Arval was seen and evaluated face-to-face.  Reports " could be better."  Continues to deny suicidal or homicidal ideations.  Denies auditory or visual hallucination.  Does reports symptoms of depression and feels as if his depression is getting worse.  States he was interested in changing his antidepressant however states he has not been consistent with taking mirtazapine.  Discussed initiating Zoloft and patient to consider starting partial hospitalization programming.  As he reports he was followed by therapist however has not made his follow-up appointment in a while.  States he was seeing a Transport planner at [employer].  Patient reports he was helpful to go inpatient to help with his symptoms.  Case staffed with attending psychiatrist Akintayo.  Will observe overnight initiate Zoloft and provide additional outpatient resources.  Patient was receptive to plan.  Support, encouragement and  reassurance was provided.   HPI: [name] is a 59 y.o. a widowed male who presents unaccompanied to [facility] reporting symptoms of depression. The patient states he has a diagnosis of depression and believes he needs to be prescribed different medications. He voiced his medications are not working for him. The patient says he has been prescribed Mirtazapine and is experiencing teeth grinding and bad dreams, which he does not take anymore.  He describes his mood as "unbelievably terrible" and acknowledges symptoms including crying spells, social withdrawal, loss of interest in usual pleasures, fatigue, decreased concentration, increased sleep, increased appetite, and feelings of worthlessness and hopelessness. He says he stays in bed all day and does not care for his hygiene and grooming. He denies current active suicidal ideation but says he frequently thinks, "what's the point?".  He says he eats unhealthy food and does not manage his diabetes or take his prescribed medications regularly. He reports a history of suicide attempts, including driving his car off a bridge. He says thoughts in the past of harming a man in his fellowship who borrowed money from Pt and will not pay it back but denies any recent thoughts of harming this person or anyone else. The patient said one year ago, he had a gun and planned to shoot this person but could not follow through. He denies any plan at this time to harm this individual or anyone else. He denies auditory or visual hallucinations. The patient has a history of using alcohol and cocaine. He discussed last use of alcohol was on 09.15.98, but cocaine was much longer than that.  Diagnosis:  Final diagnoses:  None    Total Time spent with patient: 15 minutes  Past Psychiatric History:  Past Medical History:  Past Medical History:  Diagnosis Date  . Allergies   . Arthritis   . Chest pain   . Chronic lower back pain   . Chronic pain of right wrist   . Coronary artery disease    a. Multiple prior caths/PCI. Cath 2013 with possible spasm of RCA, 70% ISR of mid LCx with subsequent DES to mLCx and prox LCX. b. H/o microvascular angina. c. Recurrent angina 08/2014 - s/p PTCA/DES to prox Cx, PTCA/CBA to OM1.  c. LHC 06/10/15 with patent stents and some ISR in LCX and OM-1 that was not flow limiting --> Rx   . Dyslipidemia    a. Intolerant to many statins except tolerating Livalo.  Marland Kitchen GERD (gastroesophageal reflux disease)   .  H/O cardiac catheterization 10/25/2018  . Heart attack ([place])   . Hypertension   . Myocardial infarction ([place]) ~ 2010  . S/P angioplasty with stent, DES, to proximal and mid LCX 12/15/11 12/15/2011  . Shoulder pain   . Stroke [facility])    pt. reports had a stroke around time of MI 2010  . Type II diabetes mellitus ([place])   . Unstable angina [facility])     Past Surgical History:  Procedure Laterality Date  . CARDIAC  CATHETERIZATION  06/15/2002   LAD with prox 40% stenosis, norma L main, Cfx with 25% lesion, RCA with long mid 25% stenosis (Dr. Vita Barley)  . CARDIAC CATHETERIZATION  04/01/2010   normal L main, LAD wit mild stenosis, L Cfx with 70% in-stent restenosis, RCA with 70% in-stent restenosis, LVEF >60% (Dr. K. Mali Hilty) - cutting ballon arthrectomy to RCA & Cfx (Dr. Rockne Menghini)  . CARDIAC CATHETERIZATION  08/25/2010   preserved global LV contractility; multivessel CAD, diffuse 90-95% in-stent restenosis in prox placed Cfx stent - cutting balloon arthrectomy in Cfx with multiple dilatations 90-95% to 0% stenosis (Dr. Corky Downs)  . CARDIAC CATHETERIZATION  01/26/2011   PCI & stenting of aggresive in-stent restenosis within previously stented AV groove Cfx with 3.0x55mm Taxus DES (previous stents were Promus) (Dr. Adora Fridge)  . CARDIAC CATHETERIZATION  05/11/2011   preserved LV function, 40% mid LAD stenosis, 30-40% narrowing proximal to stented semgnet of prox Cfx, patent mid RCA stent with smooth 20% narrowing in distal RCA (Dr. Corky Downs)  . CARDIAC CATHETERIZATION  12/15/2011   PCI & stenting of proximal & mid Cfx with DES - 3.0x53mm in proximal, 3.0x64mm in mid (Dr. Adora Fridge)  . CARDIAC CATHETERIZATION N/A 06/10/2015   Procedure: Left Heart Cath and Coronary Angiography;  Surgeon: Jolaine Artist, MD;  Location: [facility];  Service: Cardiovascular;  Laterality: N/A;  . CARDIAC CATHETERIZATION  10/25/2018  . cardiometabolic testing  1/61/0960   good exercise effort, peak VO2 79% predicted with normal VO2 HR curves (mild deconditioning)  . COLONOSCOPY  12/2012   diminutive hyperplastic sigmoid poyp so repeat routine 2024  . CORONARY BALLOON ANGIOPLASTY N/A 10/25/2018   Procedure: CORONARY BALLOON ANGIOPLASTY;  Surgeon: Jettie Booze, MD;  Location: [facility];  Service: Cardiovascular;  Laterality: N/A;  . CORONARY BALLOON ANGIOPLASTY N/A 09/29/2019   Procedure: CORONARY BALLOON  ANGIOPLASTY;  Surgeon: Nelva Bush, MD;  Location: [facility];  Service: Cardiovascular;  Laterality: N/A;  . EXCISIONAL HEMORRHOIDECTOMY  1984  . INTRAVASCULAR ULTRASOUND/IVUS N/A 09/29/2019   Procedure: Intravascular Ultrasound/IVUS;  Surgeon: Nelva Bush, MD;  Location: [facility];  Service: Cardiovascular;  Laterality: N/A;  . LEFT HEART CATH AND CORONARY ANGIOGRAPHY N/A 10/25/2018   Procedure: LEFT HEART CATH AND CORONARY ANGIOGRAPHY;  Surgeon: Jettie Booze, MD;  Location: [facility];  Service: Cardiovascular;  Laterality: N/A;  . LEFT HEART CATH AND CORONARY ANGIOGRAPHY N/A 09/29/2019   Procedure: LEFT HEART CATH AND CORONARY ANGIOGRAPHY;  Surgeon: Nelva Bush, MD;  Location: [facility];  Service: Cardiovascular;  Laterality: N/A;  . LEFT HEART CATHETERIZATION WITH CORONARY ANGIOGRAM N/A 05/11/2011   Procedure: LEFT HEART CATHETERIZATION WITH CORONARY ANGIOGRAM;  Surgeon: Troy Sine, MD;  Location: [facility] CATH LAB;  Service: Cardiovascular;  Laterality: N/A;  Possible percutaneous coronary intervention, possible IVUS  . LEFT HEART CATHETERIZATION WITH CORONARY ANGIOGRAM N/A 12/15/2011   Procedure: LEFT HEART CATHETERIZATION WITH CORONARY ANGIOGRAM;  Surgeon:  Lorretta Harp, MD;  Location: [facility] CATH LAB;  Service: Cardiovascular;  Laterality: N/A;  . LEFT HEART CATHETERIZATION WITH CORONARY ANGIOGRAM N/A 09/05/2014   Procedure: LEFT HEART CATHETERIZATION WITH CORONARY ANGIOGRAM;  Surgeon: Burnell Blanks, MD;  Location[facility] CATH LAB;  Service: Cardiovascular;  Laterality: N/A;  . LIPOMA EXCISION     back of the head  . NM MYOCAR PERF WALL MOTION  02/2012   lexiscan myoview; mild perfusion defect in mid inferolateral & basal inferolateral region (infarct/scar); EF 52%, abnormal but ow risk scan  . PERCUTANEOUS CORONARY STENT INTERVENTION (PCI-S)  09/05/2014   Procedure: PERCUTANEOUS CORONARY STENT INTERVENTION (PCI-S);  Surgeon: Burnell Blanks, MD;  Location: [facility] CATH LAB;  Service: Cardiovascular;;   Family History:  Family History  Problem Relation Age of Onset  . Leukemia Mother   . Prostate cancer Father   . Coronary artery disease Paternal Grandmother   . Cancer Paternal Grandfather   . Cancer Brother    Family Psychiatric  History:  Social History:  Social History   Substance and Sexual Activity  Alcohol Use Yes   Comment: 2 bottles of wine on 12/08/2019 (one occurrence)     Social History   Substance and Sexual Activity  Drug Use Yes  . Types: Cocaine   Comment: can't specify how much was used on 12/08/2019, "not much"    Social History   Socioeconomic History  . Marital status: Unknown    Spouse name: Not on file  . Number of children: 2  . Years of education: GED  . Highest education level: Not on file  Occupational History  . Not on file  Tobacco Use  . Smoking status: Former Smoker    Packs/day: 1.00    Years: 10.00    Pack years: 10.00    Types: Cigarettes    Quit date: 10/07/2018    Years since quitting: 1.4  . Smokeless tobacco: Never Used  Vaping Use  . Vaping Use: Never used  Substance and Sexual Activity  . Alcohol use: Yes    Comment: 2 bottles of wine on 12/08/2019 (one occurrence)  . Drug use: Yes    Types: Cocaine    Comment: can't specify how much was used on 12/08/2019, "not much"  . Sexual activity: Not Currently  Other Topics Concern  . Not on file  Social History Narrative   ** Merged History Encounter **       Social Determinants of Health   Financial Resource Strain:   . Difficulty of Paying Living Expenses: Not on file  Food Insecurity:   . Worried About Charity fundraiser in the Last Year: Not on file  . Ran Out of Food in the Last Year: Not on file  Transportation Needs:   . Lack of Transportation (Medical): Not on file  . Lack of Transportation (Non-Medical): Not on file  Physical Activity:   . Days of Exercise per Week: Not on file  . Minutes  of Exercise per Session: Not on file  Stress:   . Feeling of Stress : Not on file  Social Connections:   . Frequency of Communication with Friends and Family: Not on file  . Frequency of Social Gatherings with Friends and Family: Not on file  . Attends Religious Services: Not on file  . Active Member of Clubs or Organizations: Not on file  . Attends Archivist Meetings: Not on file  . Marital Status: Not on file   SDOH:  SDOH Screenings   Alcohol Screen: Medium Risk  . Last Alcohol Screening Score (AUDIT): 13  Depression (PHQ2-9): Low Risk   . PHQ-2 Score: 0  Financial Resource Strain:   . Difficulty of Paying Living Expenses: Not on file  Food Insecurity:   . Worried About Charity fundraiser in the Last Year: Not on file  . Ran Out of Food in the Last Year: Not on file  Housing:   . Last Housing Risk Score: Not on file  Physical Activity:   . Days of Exercise per Week: Not on file  . Minutes of Exercise per Session: Not on file  Social Connections:   . Frequency of Communication with Friends and Family: Not on file  . Frequency of Social Gatherings with Friends and Family: Not on file  . Attends Religious Services: Not on file  . Active Member of Clubs or Organizations: Not on file  . Attends Archivist Meetings: Not on file  . Marital Status: Not on file  Stress:   . Feeling of Stress : Not on file  Tobacco Use: Medium Risk  . Smoking Tobacco Use: Former Smoker  . Smokeless Tobacco Use: Never Used  Transportation Needs:   . Film/video editor (Medical): Not on file  . Lack of Transportation (Non-Medical): Not on file   Additional Social History:                         Sleep: Fair  Appetite:  Fair  Current Medications:  Current Facility-Administered Medications  Medication Dose Route Frequency Provider Last Rate Last Admin  . acetaminophen (TYLENOL) tablet 650 mg  650 mg Oral Q6H PRN Caroline Sauger, NP      . alum & mag  hydroxide-simeth (MAALOX/MYLANTA) 200-200-20 MG/5ML suspension 30 mL  30 mL Oral Q4H PRN Caroline Sauger, NP      . amLODipine (NORVASC) tablet 10 mg  10 mg Oral Daily Caroline Sauger, NP   10 mg at 03/17/20 0953  . aspirin chewable tablet 81 mg  81 mg Oral Daily Caroline Sauger, NP   81 mg at 03/17/20 0953  . magnesium hydroxide (MILK OF MAGNESIA) suspension 30 mL  30 mL Oral Daily PRN Caroline Sauger, NP      . metFORMIN (GLUCOPHAGE) tablet 500 mg  500 mg Oral BID WC Caroline Sauger, NP   500 mg at 03/17/20 0745  . metoprolol tartrate (LOPRESSOR) tablet 100 mg  100 mg Oral BID Caroline Sauger, NP   100 mg at 03/17/20 0953  . nitroGLYCERIN (NITROSTAT) SL tablet 0.4 mg  0.4 mg Sublingual Q5 min PRN Caroline Sauger, NP      . traZODone (DESYREL) tablet 50 mg  50 mg Oral QHS PRN Caroline Sauger, NP   50 mg at 03/16/20 2216   Current Outpatient Medications  Medication Sig Dispense Refill  . co-enzyme Q-10 50 MG capsule Take 50 mg by mouth daily.    Marland Kitchen amLODipine (NORVASC) 10 MG tablet Take 1 tablet (10 mg total) by mouth daily. 90 tablet 2  . aspirin 81 MG chewable tablet Chew 1 tablet (81 mg total) by mouth daily. 90 tablet 2  . metFORMIN (GLUCOPHAGE) 500 MG tablet TAKE 1 TABLET BY MOUTH TWICE DAILY WITH A MEAL 180 tablet 2  . nitroGLYCERIN (NITROSTAT) 0.4 MG SL tablet Place 1 tablet (0.4 mg total) under the tongue every 5 (five) minutes x 3 doses as needed for chest pain. 25 tablet 12  .  Pitavastatin Calcium (LIVALO) 4 MG TABS Take 4 mg by mouth daily. Take 4 mg by mouth daily    . ticagrelor (BRILINTA) 90 MG TABS tablet Take 1 tablet (90 mg total) by mouth 2 (two) times daily. 180 tablet 2    Labs  Lab Results:  Admission on 03/16/2020  Component Date Value Ref Range Status  . SARS Coronavirus 2 03/16/2020 NEGATIVE  NEGATIVE Final   Comment: (NOTE) SARS-CoV-2 target nucleic acids are NOT DETECTED.  The SARS-CoV-2 RNA is generally detectable in upper  and lower respiratory specimens during the acute phase of infection. The lowest concentration of SARS-CoV-2 viral copies this assay can detect is 250 copies / mL. A negative result does not preclude SARS-CoV-2 infection and should not be used as the sole basis for treatment or other patient management decisions.  A negative result may occur with improper specimen collection / handling, submission of specimen other than nasopharyngeal swab, presence of viral mutation(s) within the areas targeted by this assay, and inadequate number of viral copies (<250 copies / mL). A negative result must be combined with clinical observations, patient history, and epidemiological information.  Fact Sheet for Patients:   StrictlyIdeas.no  Fact Sheet for Healthcare Providers: BankingDealers.co.za  This test is not yet approved or                           cleared by the [place] FDA and has been authorized for detection and/or diagnosis of SARS-CoV-2 by FDA under an Emergency Use Authorization (EUA).  This EUA will remain in effect (meaning this test can be used) for the duration of the COVID-19 declaration under Section 564(b)(1) of the Act, 21 U.S.C. section 360bbb-3(b)(1), unless the authorization is terminated or revoked sooner.  Performed at [facility], [place] [address], [place], [place] [address]   . WBC 03/17/2020 8.0  4.0 - 10.5 K/uL Final  . RBC 03/17/2020 4.99  4.22 - 5.81 MIL/uL Final  . Hemoglobin 03/17/2020 14.0  13.0 - 17.0 g/dL Final  . HCT 03/17/2020 43.4  39 - 52 % Final  . MCV 03/17/2020 87.0  80.0 - 100.0 fL Final  . MCH 03/17/2020 28.1  26.0 - 34.0 pg Final  . MCHC 03/17/2020 32.3  30.0 - 36.0 g/dL Final  . RDW 03/17/2020 12.8  11.5 - 15.5 % Final  . Platelets 03/17/2020 247  150 - 400 K/uL Final  . nRBC 03/17/2020 0.0  0.0 - 0.2 % Final  . Neutrophils Relative % 03/17/2020 54  % Final  . Neutro Abs 03/17/2020 4.3   1.7 - 7.7 K/uL Final  . Lymphocytes Relative 03/17/2020 30  % Final  . Lymphs Abs 03/17/2020 2.4  0.7 - 4.0 K/uL Final  . Monocytes Relative 03/17/2020 9  % Final  . Monocytes Absolute 03/17/2020 0.8  0 - 1 K/uL Final  . Eosinophils Relative 03/17/2020 6  % Final  . Eosinophils Absolute 03/17/2020 0.5  0 - 0 K/uL Final  . Basophils Relative 03/17/2020 1  % Final  . Basophils Absolute 03/17/2020 0.1  0 - 0 K/uL Final  . Immature Granulocytes 03/17/2020 0  % Final  . Abs Immature Granulocytes 03/17/2020 0.03  0.00 - 0.07 K/uL Final   Performed at [facility], [place] [name], [place], [place] [address]  . Sodium 03/17/2020 140  135 - 145 mmol/L Final  . Potassium 03/17/2020 4.0  3.5 - 5.1 mmol/L Final  .  Chloride 03/17/2020 106  98 - 111 mmol/L Final  . CO2 03/17/2020 22  22 - 32 mmol/L Final  . Glucose, Bld 03/17/2020 74  70 - 99 mg/dL Final   Glucose reference range applies only to samples taken after fasting for at least 8 hours.  . BUN 03/17/2020 20  6 - 20 mg/dL Final  . Creatinine, Ser 03/17/2020 0.86  0.61 - 1.24 mg/dL Final  . Calcium 03/17/2020 9.6  8.9 - 10.3 mg/dL Final  . Total Protein 03/17/2020 6.9  6.5 - 8.1 g/dL Final  . Albumin 03/17/2020 4.4  3.5 - 5.0 g/dL Final  . AST 03/17/2020 20  15 - 41 U/L Final  . ALT 03/17/2020 31  0 - 44 U/L Final  . Alkaline Phosphatase 03/17/2020 49  38 - 126 U/L Final  . Total Bilirubin 03/17/2020 0.6  0.3 - 1.2 mg/dL Final  . GFR calc non Af Amer 03/17/2020 >60  >60 mL/min Final  . GFR calc Af Amer 03/17/2020 >60  >60 mL/min Final  . Anion gap 03/17/2020 12  5 - 15 Final   Performed at [facility] [address], [place], [place] [address]  . Hgb A1c MFr Bld 03/16/2020 6.4* 4.8 - 5.6 % Final   Comment: (NOTE) Pre diabetes:          5.7%-6.4%  Diabetes:              >6.4%  Glycemic control for   <7.0% adults with diabetes   . Mean Plasma Glucose 03/16/2020 136.98  mg/dL Final   Performed at [facility] [address], [place], [place] [address]  . Magnesium 03/17/2020 1.9  1.7 - 2.4 mg/dL Final   Performed at [facility], [place] [address] [place], [place] [address]  . Alcohol, Ethyl (B) 03/17/2020 18* <10 mg/dL Final   Comment: (NOTE) Lowest detectable limit for serum alcohol is 10 mg/dL.  For medical purposes only. Performed at [facility], [place] [address], [place], [place] [address]   . TSH 03/16/2020 4.162  0.350 - 4.500 uIU/mL Final   Comment: Performed by a 3rd Generation assay with a functional sensitivity of <=0.01 uIU/mL. Performed at [facility], [place] [address] [place], [place] [address]   . POC Amphetamine UR 03/16/2020 None Detected  None Detected Final  . POC Secobarbital (BAR) 03/16/2020 None Detected  None Detected Final  . POC Buprenorphine (BUP) 03/16/2020 None Detected  None Detected Final  . POC Oxazepam (BZO) 03/16/2020 None Detected  None Detected Final  . POC Cocaine UR 03/16/2020 None Detected  None Detected Final  . POC Methamphetamine UR 03/16/2020 None Detected  None Detected Final  . POC Morphine 03/16/2020 None Detected  None Detected Final  . POC Oxycodone UR 03/16/2020 None Detected  None Detected Final  . POC Methadone UR 03/16/2020 None Detected  None Detected Final  . POC Marijuana UR 03/16/2020 None Detected  None Detected Final  . SARS Coronavirus 2 Ag 03/16/2020 Negative  Negative Preliminary  . Glucose-Capillary 03/16/2020 78  70 - 99 mg/dL Final   Glucose reference range applies only to samples taken after fasting for at least 8 hours.  . Cholesterol 03/17/2020 168  0 - 200 mg/dL Final  . Triglycerides 03/17/2020 237* <150 mg/dL Final  . HDL 03/17/2020 45  >40 mg/dL Final  . Total CHOL/HDL Ratio 03/17/2020 3.7  RATIO Final  . VLDL 03/17/2020 47* 0 - 40 mg/dL Final  .  LDL Cholesterol 03/17/2020 76  0 - 99 mg/dL Final   Comment:        Total Cholesterol/HDL:CHD Risk Coronary Heart Disease Risk Table                     Men    Women  1/2 Average Risk   3.4   3.3  Average Risk       5.0   4.4  2 X Average Risk   9.6   7.1  3 X Average Risk  23.4   11.0        Use the calculated Patient Ratio above and the CHD Risk Table to determine the patient's CHD Risk.        ATP III CLASSIFICATION (LDL):  <100     mg/dL   Optimal  100-129  mg/dL   Near or Above                    Optimal  130-159  mg/dL   Borderline  160-189  mg/dL   High  >190     mg/dL   Very High Performed at [facility] [address] [place], [place] [address]   Admission on 02/24/2020, Discharged on 02/24/2020  Component Date Value Ref Range Status  . Sodium 02/24/2020 141  135 - 145 mmol/L Final  . Potassium 02/24/2020 3.8  3.5 - 5.1 mmol/L Final  . Chloride 02/24/2020 108  98 - 111 mmol/L Final  . CO2 02/24/2020 22  22 - 32 mmol/L Final  . Glucose, Bld 02/24/2020 113* 70 - 99 mg/dL Final   Glucose reference range applies only to samples taken after fasting for at least 8 hours.  . BUN 02/24/2020 16  6 - 20 mg/dL Final  . Creatinine, Ser 02/24/2020 1.04  0.61 - 1.24 mg/dL Final  . Calcium 02/24/2020 9.9  8.9 - 10.3 mg/dL Final  . Total Protein 02/24/2020 6.6  6.5 - 8.1 g/dL Final  . Albumin 02/24/2020 4.3  3.5 - 5.0 g/dL Final  . AST 02/24/2020 25  15 - 41 U/L Final  . ALT 02/24/2020 36  0 - 44 U/L Final  . Alkaline Phosphatase 02/24/2020 49  38 - 126 U/L Final  . Total Bilirubin 02/24/2020 0.7  0.3 - 1.2 mg/dL Final  . GFR calc non Af Amer 02/24/2020 >60  >60 mL/min Final  . GFR calc Af Amer 02/24/2020 >60  >60 mL/min Final  . Anion gap 02/24/2020 11  5 - 15 Final   Performed at [facility] [address], [place], [place] [address]  . Alcohol, Ethyl (B) 02/24/2020 <10  <10 mg/dL Final   Comment: (NOTE) Lowest detectable limit for serum alcohol is 10 mg/dL.  For medical purposes only. Performed at [facility], [place] [address], [place], [place] [address]   . WBC 02/24/2020 5.7  4.0 - 10.5 K/uL Final  . RBC  02/24/2020 4.67  4.22 - 5.81 MIL/uL Final  . Hemoglobin 02/24/2020 12.9* 13.0 - 17.0 g/dL Final  . HCT 02/24/2020 41.0  39 - 52 % Final  . MCV 02/24/2020 87.8  80.0 - 100.0 fL Final  . MCH 02/24/2020 27.6  26.0 - 34.0 pg Final  . MCHC 02/24/2020 31.5  30.0 - 36.0 g/dL Final  . RDW 02/24/2020 13.1  11.5 - 15.5 % Final  . Platelets 02/24/2020 240  150 - 400 K/uL Final  . nRBC 02/24/2020 0.0  0.0 -  0.2 % Final   Performed at [facility], [place] [address] [place], [place] [address]  . Opiates 02/24/2020 NONE DETECTED  NONE DETECTED Final  . Cocaine 02/24/2020 NONE DETECTED  NONE DETECTED Final  . Benzodiazepines 02/24/2020 NONE DETECTED  NONE DETECTED Final  . Amphetamines 02/24/2020 NONE DETECTED  NONE DETECTED Final  . Tetrahydrocannabinol 02/24/2020 NONE DETECTED  NONE DETECTED Final  . Barbiturates 02/24/2020 NONE DETECTED  NONE DETECTED Final   Comment: (NOTE) DRUG SCREEN FOR MEDICAL PURPOSES ONLY.  IF CONFIRMATION IS NEEDED FOR ANY PURPOSE, NOTIFY LAB WITHIN 5 DAYS.  LOWEST DETECTABLE LIMITS FOR URINE DRUG SCREEN Drug Class                     Cutoff (ng/mL) Amphetamine and metabolites    1000 Barbiturate and metabolites    200 Benzodiazepine                 914 Tricyclics and metabolites     300 Opiates and metabolites        300 Cocaine and metabolites        300 THC                            50 Performed at [facility], [place] [address], [place], [place] [address]   Admission on 02/08/2020, Discharged on 02/08/2020  Component Date Value Ref Range Status  . WBC 02/08/2020 5.2  4.0 - 10.5 K/uL Final  . RBC 02/08/2020 5.17  4.22 - 5.81 MIL/uL Final  . Hemoglobin 02/08/2020 14.5  13.0 - 17.0 g/dL Final  . HCT 02/08/2020 44.4  39 - 52 % Final  . MCV 02/08/2020 85.9  80.0 - 100.0 fL Final  . MCH 02/08/2020 28.0  26.0 - 34.0 pg Final  . MCHC 02/08/2020 32.7  30.0 - 36.0 g/dL Final  . RDW 02/08/2020 12.5  11.5 - 15.5 % Final  . Platelets 02/08/2020 280  150 - 400  K/uL Final  . nRBC 02/08/2020 0.0  0.0 - 0.2 % Final   Performed at [facility], [place] [address] [place], [place] [address]  . Sodium 02/08/2020 137  135 - 145 mmol/L Final  . Potassium 02/08/2020 4.0  3.5 - 5.1 mmol/L Final  . Chloride 02/08/2020 107  98 - 111 mmol/L Final  . CO2 02/08/2020 18* 22 - 32 mmol/L Final  . Glucose, Bld 02/08/2020 93  70 - 99 mg/dL Final   Glucose reference range applies only to samples taken after fasting for at least 8 hours.  . BUN 02/08/2020 12  6 - 20 mg/dL Final  . Creatinine, Ser 02/08/2020 0.93  0.61 - 1.24 mg/dL Final  . Calcium 02/08/2020 9.4  8.9 - 10.3 mg/dL Final  . GFR calc non Af Amer 02/08/2020 >60  >60 mL/min Final  . GFR calc Af Amer 02/08/2020 >60  >60 mL/min Final  . Anion gap 02/08/2020 12  5 - 15 Final   Performed at [facility], [place] [address], [place],  [address]  . Glucose-Capillary 02/08/2020 91  70 - 99 mg/dL Final   Glucose reference range applies only to samples taken after fasting for at least 8 hours.  Admission on 12/10/2019, Discharged on 12/13/2019  Component Date Value Ref Range Status  . Cholesterol 12/10/2019 142  0 - 200 mg/dL Final  . Triglycerides 12/10/2019 146  <150 mg/dL Final  . HDL  12/10/2019 41  >40 mg/dL Final  . Total CHOL/HDL Ratio 12/10/2019 3.5  RATIO Final  . VLDL 12/10/2019 29  0 - 40 mg/dL Final  . LDL Cholesterol 12/10/2019 72  0 - 99 mg/dL Final   Comment:        Total Cholesterol/HDL:CHD Risk Coronary Heart Disease Risk Table                     Men   Women  1/2 Average Risk   3.4   3.3  Average Risk       5.0   4.4  2 X Average Risk   9.6   7.1  3 X Average Risk  23.4   11.0        Use the calculated Patient Ratio above and the CHD Risk Table to determine the patient's CHD Risk.        ATP III CLASSIFICATION (LDL):  <100     mg/dL   Optimal  100-129  mg/dL   Near or Above                    Optimal  130-159  mg/dL   Borderline  160-189  mg/dL   High  >190     mg/dL    Very High Performed at [facility] [address] [place], [place] [address]   . TSH 12/10/2019 2.723  0.350 - 4.500 uIU/mL Final   Comment: Performed by a 3rd Generation assay with a functional sensitivity of <=0.01 uIU/mL. Performed at [facility], [place] [address], [place], [place] [address]   . Hgb A1c MFr Bld 12/12/2019 6.9* 4.8 - 5.6 % Final   Comment: (NOTE) Pre diabetes:          5.7%-6.4%  Diabetes:              >6.4%  Glycemic control for   <7.0% adults with diabetes   . Mean Plasma Glucose 12/12/2019 151.33  mg/dL Final   Performed at [facility] [address], [place], [place] [address]  . Glucose-Capillary 12/12/2019 119* 70 - 99 mg/dL Final   Glucose reference range applies only to samples taken after fasting for at least 8 hours.  . Glucose-Capillary 12/13/2019 118* 70 - 99 mg/dL Final   Glucose reference range applies only to samples taken after fasting for at least 8 hours.  . Comment 1 12/13/2019 Notify RN   Final  . Comment 2 12/13/2019 Document in Chart   Final  Admission on 12/09/2019, Discharged on 12/10/2019  Component Date Value Ref Range Status  . SARS Coronavirus 2 12/09/2019 NEGATIVE  NEGATIVE Final   Comment: (NOTE) SARS-CoV-2 target nucleic acids are NOT DETECTED.  The SARS-CoV-2 RNA is generally detectable in upper and lower respiratory specimens during the acute phase of infection. The lowest concentration of SARS-CoV-2 viral copies this assay can detect is 250 copies / mL. A negative result does not preclude SARS-CoV-2 infection and should not be used as the sole basis for treatment or other patient management decisions.  A negative result may occur with improper specimen collection / handling, submission of specimen other than nasopharyngeal swab, presence of viral mutation(s) within the areas targeted by this assay, and inadequate number of viral copies (<250 copies / mL). A negative result must  be combined with clinical observations, patient history, and epidemiological information.  Fact Sheet for Patients:   StrictlyIdeas.no  Fact Sheet for Healthcare Providers: BankingDealers.co.za  This test is not yet approved or                           cleared by the [place] and has been authorized for detection and/or diagnosis of SARS-CoV-2 by FDA under an Emergency Use Authorization (EUA).  This EUA will remain in effect (meaning this test can be used) for the duration of the COVID-19 declaration under Section 564(b)(1) of the Act, 21 U.S.C. section 360bbb-3(b)(1), unless the authorization is terminated or revoked sooner.  Performed at [facility], [place] [address] [place], [place] [address]   . Sodium 12/09/2019 139  135 - 145 mmol/L Final  . Potassium 12/09/2019 4.0  3.5 - 5.1 mmol/L Final  . Chloride 12/09/2019 106  98 - 111 mmol/L Final  . CO2 12/09/2019 23  22 - 32 mmol/L Final  . Glucose, Bld 12/09/2019 94  70 - 99 mg/dL Final   Glucose reference range applies only to samples taken after fasting for at least 8 hours.  . BUN 12/09/2019 27* 6 - 20 mg/dL Final  . Creatinine, Ser 12/09/2019 0.97  0.61 - 1.24 mg/dL Final  . Calcium 12/09/2019 8.9  8.9 - 10.3 mg/dL Final  . Total Protein 12/09/2019 6.9  6.5 - 8.1 g/dL Final  . Albumin 12/09/2019 4.5  3.5 - 5.0 g/dL Final  . AST 12/09/2019 51* 15 - 41 U/L Final  . ALT 12/09/2019 45* 0 - 44 U/L Final  . Alkaline Phosphatase 12/09/2019 51  38 - 126 U/L Final  . Total Bilirubin 12/09/2019 0.7  0.3 - 1.2 mg/dL Final  . GFR calc non Af Amer 12/09/2019 >60  >60 mL/min Final  . GFR calc Af Amer 12/09/2019 >60  >60 mL/min Final  . Anion gap 12/09/2019 10  5 - 15 Final   Performed at [facility], [place] [address], [place], [place] [address]  . Alcohol, Ethyl (B) 12/09/2019 <10  <10 mg/dL Final   Comment: (NOTE) Lowest detectable limit for  serum alcohol is 10 mg/dL.  For medical purposes only. Performed at [facility], [place] [address], [place], [place] [address]   . Salicylate Lvl 41/66/0630 <7.0* 7.0 - 30.0 mg/dL Final   Performed at [facility] [address], [place], [place] [address]  . Acetaminophen (Tylenol), Serum 12/09/2019 <10* 10 - 30 ug/mL Final   Comment: (NOTE) Therapeutic concentrations vary significantly. A range of 10-30 ug/mL  may be an effective concentration for many patients. However, some  are best treated at concentrations outside of this range. Acetaminophen concentrations >150 ug/mL at 4 hours after ingestion  and >50 ug/mL at 12 hours after ingestion are often associated with  toxic reactions.  Performed at [facility], [place] [address], [place], [place] [address]   . WBC 12/09/2019 7.4  4.0 - 10.5 K/uL Final  . RBC 12/09/2019 4.45  4.22 - 5.81 MIL/uL Final  . Hemoglobin 12/09/2019 12.9* 13.0 - 17.0 g/dL Final  . HCT 12/09/2019 38.5* 39 - 52 % Final  . MCV 12/09/2019 86.5  80.0 - 100.0 fL Final  . MCH 12/09/2019 29.0  26.0 - 34.0 pg Final  . MCHC 12/09/2019 33.5  30.0 - 36.0 g/dL Final  . RDW 12/09/2019 12.7  11.5 - 15.5 % Final  . Platelets 12/09/2019 223  150 - 400 K/uL Final  . nRBC 12/09/2019 0.0  0.0 - 0.2 % Final  Performed at [facility], [place] [address] [place], [place] [address]  . Opiates 12/09/2019 NONE DETECTED  NONE DETECTED Final  . Cocaine 12/09/2019 POSITIVE* NONE DETECTED Final  . Benzodiazepines 12/09/2019 NONE DETECTED  NONE DETECTED Final  . Amphetamines 12/09/2019 NONE DETECTED  NONE DETECTED Final  . Tetrahydrocannabinol 12/09/2019 NONE DETECTED  NONE DETECTED Final  . Barbiturates 12/09/2019 NONE DETECTED  NONE DETECTED Final   Comment: (NOTE) DRUG SCREEN FOR MEDICAL PURPOSES ONLY.  IF CONFIRMATION IS NEEDED FOR ANY PURPOSE, NOTIFY LAB WITHIN 5 DAYS.  LOWEST DETECTABLE LIMITS FOR  URINE DRUG SCREEN Drug Class                     Cutoff (ng/mL) Amphetamine and metabolites    1000 Barbiturate and metabolites    200 Benzodiazepine                 671 Tricyclics and metabolites     300 Opiates and metabolites        300 Cocaine and metabolites        300 THC                            50 Performed at [facility], [place] [address] [place], [place] [address]   Office Visit on 10/03/2019  Component Date Value Ref Range Status  . Glucose, UA 10/03/2019 Negative  Negative Final  . Bilirubin, UA 10/03/2019 Negative   Final  . Ketones, UA 10/03/2019 Negative   Final  . Spec Grav, UA 10/03/2019 <=1.005* 1.010 - 1.025 Final  . Blood, UA 10/03/2019 Negative   Final  . pH, UA 10/03/2019 6.0  5.0 - 8.0 Final  . Protein, UA 10/03/2019 Negative  Negative Final  . Urobilinogen, UA 10/03/2019 0.2  0.2 or 1.0 E.U./dL Final  . Nitrite, UA 10/03/2019 Negative   Final  . Leukocytes, UA 10/03/2019 Negative  Negative Final  . POC Glucose 10/03/2019 206* 70 - 99 mg/dl Final  Admission on 09/28/2019, Discharged on 09/30/2019  Component Date Value Ref Range Status  . Sodium 09/28/2019 140  135 - 145 mmol/L Final  . Potassium 09/28/2019 4.4  3.5 - 5.1 mmol/L Final  . Chloride 09/28/2019 108  98 - 111 mmol/L Final  . CO2 09/28/2019 23  22 - 32 mmol/L Final  . Glucose, Bld 09/28/2019 161* 70 - 99 mg/dL Final   Glucose reference range applies only to samples taken after fasting for at least 8 hours.  . BUN 09/28/2019 15  6 - 20 mg/dL Final  . Creatinine, Ser 09/28/2019 0.79  0.61 - 1.24 mg/dL Final  . Calcium 09/28/2019 9.3  8.9 - 10.3 mg/dL Final  . GFR calc non Af Amer 09/28/2019 >60  >60 mL/min Final  . GFR calc Af Amer 09/28/2019 >60  >60 mL/min Final  . Anion gap 09/28/2019 9  5 - 15 Final   Performed at [facility], [place] [address] [place], [place] [address]  . WBC 09/28/2019 5.8  4.0 - 10.5 K/uL Final  . RBC 09/28/2019 5.04  4.22 - 5.81 MIL/uL Final   . Hemoglobin 09/28/2019 14.3  13.0 - 17.0 g/dL Final  . HCT 09/28/2019 44.8  39 - 52 % Final  . MCV 09/28/2019 88.9  80.0 - 100.0 fL Final  . MCH 09/28/2019 28.4  26.0 - 34.0 pg Final  . MCHC 09/28/2019 31.9  30.0 - 36.0 g/dL  Final  . RDW 09/28/2019 12.2  11.5 - 15.5 % Final  . Platelets 09/28/2019 252  150 - 400 K/uL Final  . nRBC 09/28/2019 0.0  0.0 - 0.2 % Final   Performed at [facility], [place] [address], [place], [place] [address]  . Troponin I (High Sensitivity) 09/28/2019 8  <18 ng/L Final   Comment: (NOTE) Elevated high sensitivity troponin I (hsTnI) values and significant  changes across serial measurements may suggest ACS but many other  chronic and acute conditions are known to elevate hsTnI results.  Refer to the "Links" section for chest pain algorithms and additional  guidance. Performed at [facility], [place] [address] [place], [place] [address]   . B Natriuretic Peptide 09/28/2019 31.4  0.0 - 100.0 pg/mL Final   Performed at [facility], [place] [address], [place], [place] [address]  . Troponin I (High Sensitivity) 09/28/2019 8  <18 ng/L Final   Comment: (NOTE) Elevated high sensitivity troponin I (hsTnI) values and significant  changes across serial measurements may suggest ACS but many other  chronic and acute conditions are known to elevate hsTnI results.  Refer to the "Links" section for chest pain algorithms and additional  guidance. Performed at [facility], [place] [address], [place], [place] [address]   . Creatinine, Ser 09/28/2019 0.74  0.61 - 1.24 mg/dL Final  . GFR calc non Af Amer 09/28/2019 >60  >60 mL/min Final  . GFR calc Af Amer 09/28/2019 >60  >60 mL/min Final   Performed at [facility], [place] [address], [place], [name] [address]  . Weight 09/29/2019 3,072.33  oz Final  . Height 09/29/2019 68  in Final  . BP 09/29/2019 105/79  mmHg Final  . SARS Coronavirus 2 09/28/2019 NEGATIVE  NEGATIVE Final   Comment:  (NOTE) SARS-CoV-2 target nucleic acids are NOT DETECTED. The SARS-CoV-2 RNA is generally detectable in upper and lower respiratory specimens during the acute phase of infection. Negative results do not preclude SARS-CoV-2 infection, do not rule out co-infections with other pathogens, and should not be used as the sole basis for treatment or other patient management decisions. Negative results must be combined with clinical observations, patient history, and epidemiological information. The expected result is Negative. Fact Sheet for Patients: SugarRoll.be Fact Sheet for Healthcare Providers: https://www.woods-mathews.com/ This test is not yet approved or cleared by the [place] FDA and  has been authorized for detection and/or diagnosis of SARS-CoV-2 by FDA under an Emergency Use Authorization (EUA). This EUA will remain  in effect (meaning this test can be used) for the duration of the COVID-19 declaration under Section 56                          4(b)(1) of the Act, 21 U.S.C. section 360bbb-3(b)(1), unless the authorization is terminated or revoked sooner. Performed at [facility], [place] [address], [place], [place] [address]   . Sodium 09/29/2019 141  135 - 145 mmol/L Final  . Potassium 09/29/2019 4.0  3.5 - 5.1 mmol/L Final  . Chloride 09/29/2019 107  98 - 111 mmol/L Final  . CO2 09/29/2019 24  22 - 32 mmol/L Final  . Glucose, Bld 09/29/2019 162* 70 - 99 mg/dL Final   Glucose reference range applies only to samples taken after fasting for at least 8 hours.  . BUN 09/29/2019 13  6 - 20 mg/dL Final  . Creatinine, Ser  09/29/2019 0.82  0.61 - 1.24 mg/dL Final  . Calcium 09/29/2019 9.0  8.9 - 10.3 mg/dL Final  . GFR calc non Af Amer 09/29/2019 >60  >60 mL/min Final  . GFR calc Af Amer 09/29/2019 >60  >60 mL/min Final  . Anion gap 09/29/2019 10  5 - 15 Final   Performed at [facility], [place] [address] [place], [place]  [address]  . WBC 09/28/2019 6.4  4.0 - 10.5 K/uL Final  . RBC 09/28/2019 4.74  4.22 - 5.81 MIL/uL Final  . Hemoglobin 09/28/2019 13.5  13.0 - 17.0 g/dL Final  . HCT 09/28/2019 41.6  39 - 52 % Final  . MCV 09/28/2019 87.8  80.0 - 100.0 fL Final  . MCH 09/28/2019 28.5  26.0 - 34.0 pg Final  . MCHC 09/28/2019 32.5  30.0 - 36.0 g/dL Final  . RDW 09/28/2019 12.3  11.5 - 15.5 % Final  . Platelets 09/28/2019 233  150 - 400 K/uL Final  . nRBC 09/28/2019 0.0  0.0 - 0.2 % Final   Performed at [facility], [place] [address] [place], [place] [address]  . Glucose-Capillary 09/28/2019 182* 70 - 99 mg/dL Final   Glucose reference range applies only to samples taken after fasting for at least 8 hours.  . Comment 1 09/28/2019 Notify RN   Final  . Comment 2 09/28/2019 Document in Chart   Final  . Glucose-Capillary 09/29/2019 158* 70 - 99 mg/dL Final   Glucose reference range applies only to samples taken after fasting for at least 8 hours.  . Glucose-Capillary 09/29/2019 129* 70 - 99 mg/dL Final   Glucose reference range applies only to samples taken after fasting for at least 8 hours.  . Activated Clotting Time 09/29/2019 257  seconds Final  . Activated Clotting Time 09/29/2019 285  seconds Final  . Activated Clotting Time 09/29/2019 279  seconds Final  . Sodium 09/30/2019 136  135 - 145 mmol/L Final  . Potassium 09/30/2019 4.2  3.5 - 5.1 mmol/L Final  . Chloride 09/30/2019 105  98 - 111 mmol/L Final  . CO2 09/30/2019 22  22 - 32 mmol/L Final  . Glucose, Bld 09/30/2019 263* 70 - 99 mg/dL Final   Glucose reference range applies only to samples taken after fasting for at least 8 hours.  . BUN 09/30/2019 15  6 - 20 mg/dL Final  . Creatinine, Ser 09/30/2019 0.84  0.61 - 1.24 mg/dL Final  . Calcium 09/30/2019 8.8* 8.9 - 10.3 mg/dL Final  . GFR calc non Af Amer 09/30/2019 >60  >60 mL/min Final  . GFR calc Af Amer 09/30/2019 >60  >60 mL/min Final  . Anion gap 09/30/2019 9  5 - 15 Final   Performed at  [facility], [place] [address] [place], [place] [address]  . WBC 09/30/2019 6.9  4.0 - 10.5 K/uL Final  . RBC 09/30/2019 4.22  4.22 - 5.81 MIL/uL Final  . Hemoglobin 09/30/2019 12.2* 13.0 - 17.0 g/dL Final  . HCT 09/30/2019 37.5* 39 - 52 % Final  . MCV 09/30/2019 88.9  80.0 - 100.0 fL Final  . MCH 09/30/2019 28.9  26.0 - 34.0 pg Final  . MCHC 09/30/2019 32.5  30.0 - 36.0 g/dL Final  . RDW 09/30/2019 12.3  11.5 - 15.5 % Final  . Platelets 09/30/2019 216  150 - 400 K/uL Final  . nRBC 09/30/2019 0.0  0.0 - 0.2 % Final   Performed at [facility], [address] [place], [place] [address]  . Glucose-Capillary 09/29/2019 206* 70 - 99 mg/dL Final   Glucose reference range applies only to samples taken after fasting for at least 8 hours.  . Glucose-Capillary 09/29/2019 177* 70 - 99 mg/dL Final   Glucose reference range applies only to samples taken after fasting for at least 8 hours.  . Comment 1 09/29/2019 Notify RN   Final  . Comment 2 09/29/2019 Document in Chart   Final  . Glucose-Capillary 09/30/2019 225* 70 - 99 mg/dL Final   Glucose reference range applies only to samples taken after fasting for at least 8 hours.    Blood Alcohol level:  Lab Results  Component Value Date   ETH 18 (H) 03/17/2020   ETH <10 13/24/4010    Metabolic Disorder Labs: Lab Results  Component Value Date   HGBA1C 6.4 (H) 03/16/2020   MPG 136.98 03/16/2020   MPG 151.33 12/12/2019   No results found for: PROLACTIN Lab Results  Component Value Date   CHOL 168 03/17/2020   TRIG 237 (H) 03/17/2020   HDL 45 03/17/2020   CHOLHDL 3.7 03/17/2020   VLDL 47 (H) 03/17/2020   LDLCALC 76 03/17/2020   LDLCALC 72 12/10/2019    Therapeutic Lab Levels: No results found for: LITHIUM No results found for: VALPROATE No components found for:  CBMZ  Physical Findings   AIMS     Admission (Discharged) from OP Visit from 09/07/2019 in [place] 300B Admission (Discharged)  from OP Visit from 01/12/2019 in [place] 300B Admission (Discharged) from OP Visit from 08/15/2018 in [place] 400B Admission (Discharged) from OP Visit from 10/02/2017 in [place] 300B Admission (Discharged) from 06/22/2017 in [place] 400B  AIMS Total Score 0 0 0 0 0    AUDIT     Admission (Discharged) from 12/10/2019 in [place] 300B Admission (Discharged) from OP Visit from 09/07/2019 in [place] 300B Admission (Discharged) from OP Visit from 01/12/2019 in [place] 300B Admission (Discharged) from OP Visit from 10/02/2017 in [place] 300B Admission (Discharged) from 06/22/2017 in [place] 400B  Alcohol Use Disorder Identification Test Final Score (AUDIT) 13 25 0 29 0    PHQ2-9     Office Visit from 07/05/2019 in [place] Office Visit from 07/26/2018 in [place] Office Visit from 07/15/2018 in [place] Office Visit from 05/03/2018 in [place] Office Visit from 04/01/2018 in [place]  PHQ-2 Total Score 0 0 0 0 3  PHQ-9 Total Score -- -- -- 1 11       Musculoskeletal  Strength & Muscle Tone: within normal limits Gait & Station: normal Patient leans: N/A  Psychiatric Specialty Exam  Presentation  General Appearance: Appropriate for Environment;Casual  Eye Contact:Good  Speech:Clear and Coherent;Normal Rate  Speech Volume:Normal  Handedness:Right   Mood and Affect  Mood:Depressed  Affect:Blunt;Congruent;Depressed;Flat   Thought Process  Thought Processes:Coherent  Descriptions of Associations:Intact  Orientation:Full (Time, Place and Person)  Thought  Content:Logical  Hallucinations:Hallucinations: None  Ideas of Reference:None  Suicidal Thoughts:Suicidal Thoughts: Yes, Passive SI Passive Intent and/or Plan: With Plan;With Means to Carry Out;With Intent;With Access to Means  Homicidal Thoughts:Homicidal Thoughts: No   Sensorium  Memory:Immediate Good;Recent Good;Remote  Good  Judgment:Fair  Insight:Poor   Executive Functions  Concentration:Good  Attention Span:Good  Recall:Good  Fund of Knowledge:Good  Language:Good   Psychomotor Activity  Psychomotor Activity:Psychomotor Activity: Normal   Assets  Assets:Communication Skills;Social Support;Physical Health   Sleep  Sleep:Sleep: Good Number of Hours of Sleep: 8   Physical Exam  Physical Exam ROS Blood pressure (!) 125/96, pulse (!) 56, temperature 97.7 F (36.5 C), temperature source Temporal, resp. rate 18, height 5\' 8"  (1.727 m), weight 185 lb (83.9 kg), SpO2 99 %. Body mass index is 28.13 kg/m.  Treatment Plan Summary: Daily contact with patient to assess and evaluate symptoms and progress in treatment, Medication management and Plan Consider discharging to PHP at [facility]  -Consider follow-up with partial hospitalization programming at the [facility] - Initiated Zoloft 25 mg po daily -Continue Mirtazapine 15 mg   Derrill Center, NP 03/17/2020 11:01 AM

## 2020-03-17 NOTE — ED Notes (Signed)
D: Pt alert and oriented on the unit.   A: Education, support, and encouragement provided. Discharge summary, medications and follow up appointments reviewed with pt. Suicide prevention resources provided. Pt's belongings in lockerreturned and belongings sheet signed.  R: Pt denies SI/HI, A/VH, pain, or any concerns at this time. Pt ambulatory on and off unit. Pt discharged to lobby. 

## 2020-03-18 ENCOUNTER — Telehealth (HOSPITAL_COMMUNITY): Payer: Self-pay | Admitting: Professional

## 2020-03-18 ENCOUNTER — Inpatient Hospital Stay (HOSPITAL_COMMUNITY)
Admission: RE | Admit: 2020-03-18 | Discharge: 2020-03-21 | DRG: 885 | Disposition: A | Discharge status: Home / Self Care | Payer: Medicaid Other | Attending: Psychiatry | Admitting: Psychiatry

## 2020-03-18 ENCOUNTER — Other Ambulatory Visit: Payer: Self-pay

## 2020-03-18 ENCOUNTER — Encounter (HOSPITAL_COMMUNITY): Payer: Self-pay | Admitting: Psychiatry

## 2020-03-18 DIAGNOSIS — K219 Gastro-esophageal reflux disease without esophagitis: Secondary | ICD-10-CM | POA: Diagnosis present

## 2020-03-18 DIAGNOSIS — F322 Major depressive disorder, single episode, severe without psychotic features: Secondary | ICD-10-CM | POA: Diagnosis not present

## 2020-03-18 DIAGNOSIS — F332 Major depressive disorder, recurrent severe without psychotic features: Principal | ICD-10-CM | POA: Diagnosis present

## 2020-03-18 DIAGNOSIS — I2510 Atherosclerotic heart disease of native coronary artery without angina pectoris: Secondary | ICD-10-CM | POA: Diagnosis not present

## 2020-03-18 DIAGNOSIS — Z955 Presence of coronary angioplasty implant and graft: Secondary | ICD-10-CM

## 2020-03-18 DIAGNOSIS — Z7984 Long term (current) use of oral hypoglycemic drugs: Secondary | ICD-10-CM | POA: Diagnosis not present

## 2020-03-18 DIAGNOSIS — F1024 Alcohol dependence with alcohol-induced mood disorder: Secondary | ICD-10-CM | POA: Diagnosis not present

## 2020-03-18 DIAGNOSIS — F17210 Nicotine dependence, cigarettes, uncomplicated: Secondary | ICD-10-CM | POA: Diagnosis present

## 2020-03-18 DIAGNOSIS — E785 Hyperlipidemia, unspecified: Secondary | ICD-10-CM | POA: Diagnosis not present

## 2020-03-18 DIAGNOSIS — R45851 Suicidal ideations: Secondary | ICD-10-CM | POA: Diagnosis present

## 2020-03-18 DIAGNOSIS — Z79899 Other long term (current) drug therapy: Secondary | ICD-10-CM

## 2020-03-18 DIAGNOSIS — Z20822 Contact with and (suspected) exposure to covid-19: Secondary | ICD-10-CM | POA: Diagnosis present

## 2020-03-18 DIAGNOSIS — I252 Old myocardial infarction: Secondary | ICD-10-CM | POA: Diagnosis not present

## 2020-03-18 DIAGNOSIS — E119 Type 2 diabetes mellitus without complications: Secondary | ICD-10-CM | POA: Diagnosis not present

## 2020-03-18 DIAGNOSIS — F1020 Alcohol dependence, uncomplicated: Secondary | ICD-10-CM | POA: Diagnosis present

## 2020-03-18 DIAGNOSIS — K5900 Constipation, unspecified: Secondary | ICD-10-CM | POA: Diagnosis present

## 2020-03-18 DIAGNOSIS — Z9861 Coronary angioplasty status: Secondary | ICD-10-CM | POA: Diagnosis not present

## 2020-03-18 DIAGNOSIS — G4710 Hypersomnia, unspecified: Secondary | ICD-10-CM | POA: Diagnosis present

## 2020-03-18 DIAGNOSIS — Z818 Family history of other mental and behavioral disorders: Secondary | ICD-10-CM | POA: Diagnosis not present

## 2020-03-18 DIAGNOSIS — I10 Essential (primary) hypertension: Secondary | ICD-10-CM | POA: Diagnosis not present

## 2020-03-18 DIAGNOSIS — Z8673 Personal history of transient ischemic attack (TIA), and cerebral infarction without residual deficits: Secondary | ICD-10-CM

## 2020-03-18 LAB — SARS CORONAVIRUS 2 BY RT PCR (HOSPITAL ORDER, PERFORMED IN ~~LOC~~ HOSPITAL LAB): SARS Coronavirus 2: NEGATIVE

## 2020-03-18 LAB — PROLACTIN: Prolactin: 13 ng/mL (ref 4.0–15.2)

## 2020-03-18 LAB — GLUCOSE, CAPILLARY: Glucose-Capillary: 106 mg/dL — ABNORMAL HIGH (ref 70–99)

## 2020-03-18 MED ORDER — THIAMINE HCL 100 MG PO TABS
100.0000 mg | ORAL_TABLET | Freq: Every day | ORAL | Status: DC
  Administered 2020-03-18 – 2020-03-21 (×4): 100 mg via ORAL
  Filled 2020-03-18 (×7): qty 1

## 2020-03-18 MED ORDER — LORAZEPAM 1 MG PO TABS: 1.0000 mg | ORAL_TABLET | Freq: Four times a day (QID) | ORAL | Status: DC | PRN

## 2020-03-18 MED ORDER — ALUM & MAG HYDROXIDE-SIMETH 200-200-20 MG/5ML PO SUSP
30.0000 mL | ORAL | Status: DC | PRN
  Filled 2020-03-18: qty 30

## 2020-03-18 MED ORDER — ASPIRIN EC 81 MG PO TBEC
81.0000 mg | DELAYED_RELEASE_TABLET | Freq: Every day | ORAL | Status: DC
  Administered 2020-03-18 – 2020-03-21 (×4): 81 mg via ORAL
  Filled 2020-03-18 (×7): qty 1

## 2020-03-18 MED ORDER — HYDROXYZINE HCL 25 MG PO TABS
25.0000 mg | ORAL_TABLET | Freq: Three times a day (TID) | ORAL | Status: DC | PRN
  Administered 2020-03-18 – 2020-03-20 (×3): 25 mg via ORAL
  Filled 2020-03-18 (×4): qty 1

## 2020-03-18 MED ORDER — INSULIN ASPART 100 UNIT/ML ~~LOC~~ SOLN
0.0000 [IU] | Freq: Every day | SUBCUTANEOUS | Status: DC
  Filled 2020-03-18 (×9): qty 0.05

## 2020-03-18 MED ORDER — AMLODIPINE BESYLATE 5 MG PO TABS
5.0000 mg | ORAL_TABLET | Freq: Every day | ORAL | Status: DC
  Administered 2020-03-18 – 2020-03-21 (×4): 5 mg via ORAL
  Filled 2020-03-18 (×7): qty 1

## 2020-03-18 MED ORDER — SERTRALINE HCL 25 MG PO TABS
25.0000 mg | ORAL_TABLET | Freq: Every day | ORAL | Status: DC
  Administered 2020-03-18 – 2020-03-19 (×2): 25 mg via ORAL
  Filled 2020-03-18 (×5): qty 1

## 2020-03-18 MED ORDER — ACETAMINOPHEN 325 MG PO TABS
650.0000 mg | ORAL_TABLET | Freq: Four times a day (QID) | ORAL | Status: DC | PRN
  Administered 2020-03-19: 650 mg via ORAL
  Filled 2020-03-18 (×2): qty 2

## 2020-03-18 MED ORDER — FOLIC ACID 1 MG PO TABS
1.0000 mg | ORAL_TABLET | Freq: Every day | ORAL | Status: DC
  Administered 2020-03-18 – 2020-03-21 (×4): 1 mg via ORAL
  Filled 2020-03-18 (×7): qty 1

## 2020-03-18 MED ORDER — NITROGLYCERIN 0.4 MG SL SUBL: 0.4000 mg | SUBLINGUAL_TABLET | SUBLINGUAL | Status: DC | PRN

## 2020-03-18 MED ORDER — INSULIN ASPART 100 UNIT/ML ~~LOC~~ SOLN
0.0000 [IU] | Freq: Three times a day (TID) | SUBCUTANEOUS | Status: DC
  Administered 2020-03-19 – 2020-03-21 (×3): 2 [IU] via SUBCUTANEOUS
  Filled 2020-03-18 (×25): qty 0.15

## 2020-03-18 MED ORDER — METOPROLOL TARTRATE 100 MG PO TABS
100.0000 mg | ORAL_TABLET | Freq: Two times a day (BID) | ORAL | Status: DC
  Administered 2020-03-18 – 2020-03-21 (×6): 100 mg via ORAL
  Filled 2020-03-18 (×9): qty 1
  Filled 2020-03-18: qty 4
  Filled 2020-03-18: qty 1

## 2020-03-18 MED ORDER — MAGNESIUM HYDROXIDE 400 MG/5ML PO SUSP
30.0000 mL | Freq: Every day | ORAL | Status: DC | PRN
  Administered 2020-03-19: 30 mL via ORAL
  Filled 2020-03-18: qty 30

## 2020-03-18 MED ORDER — TRAZODONE HCL 50 MG PO TABS
50.0000 mg | ORAL_TABLET | Freq: Every evening | ORAL | Status: DC | PRN
  Administered 2020-03-18 – 2020-03-20 (×3): 50 mg via ORAL
  Filled 2020-03-18 (×4): qty 1

## 2020-03-18 NOTE — Tx Team (Signed)
Initial Treatment Plan 03/18/2020 10:31 PM [name] PYK:[id number]    PATIENT STRESSORS: Substance abuse   PATIENT STRENGTHS: Average or above average intelligence Capable of independent living Motivation for treatment/growth Supportive family/friends   PATIENT IDENTIFIED PROBLEMS: Alcoholism  Suicidal ideation  (pt wants to work on his mood and find an antidepressant that works for him)                 DISCHARGE CRITERIA:  Improved stabilization in mood, thinking, and/or behavior Verbal commitment to aftercare and medication compliance  PRELIMINARY DISCHARGE PLAN: Attend aftercare/continuing care group Attend PHP/IOP Outpatient therapy Return to previous living arrangement  PATIENT/FAMILY INVOLVEMENT: This treatment plan has been presented to and reviewed with the patient, [name], and/or family member.  The patient and family have been given the opportunity to ask questions and make suggestions.  Lajoyce Corners, RN 03/18/2020, 10:31 PM

## 2020-03-18 NOTE — BH Assessment (Signed)
Assessment Note  [name] is an 59 y.o. male presenting to [facility] with reports of increased depressive symptoms and suicidal ideations. Pt was seen at [facility] ED the past few days with similar presentation. Pt reports that his mental health has been declining and as a result his physical health is being affected. Pt states he is finding it difficult to get out of bed and take care of himself. For the past month client reports not taking any medications, not attending his support groups and not going to therapy. Pt also reports the death of his mother, wife, older brother, nephew and two cousins over the past two years.  Pt reports a history of mental health services from [place] and [place]. Pt reports that his PCP is Kathe Becton, NP and she is prescribing his medications at this time. Pt sees Lamount Cohen at [place] for therapy once a month and attends support group at [facility]. Pt also reports attending [place] meetings but has not done so recently. Pt reports symptoms of isolating, sleeping and eating more, feeling hopeless, helpless and worthless, anhedonia and passive suicidal ideation.  Pt has a history of alcohol use starting at the age of 58. Pt reports that his longest period of abstinence was 4 years and 1 month. Pt reports drinking a 22oz can of beer and a bottle of "grape wine" on 03/16/2020 after being discharged from [facility]. Pt reports drinking several weeks ago before then.  Pt denies HI/AVH and SIB. Pt presents with suicidal ideations with a plan of not taking his medications. Pt has two prior suicidal attempts (driving car off bridge and OD). Pt could not contract for safety and could not provide any protective factors.   Disposition: Pt staffed with Elmarie Shiley, NP and Dr. Dwyane Dee. Recommendations are for inpatient treatment.   Diagnosis: MDD, severe, recurrent  Alcohol use disorder  Past Medical History:  Past Medical History:  Diagnosis Date  . Allergies   . Arthritis   . Chest pain   .  Chronic lower back pain   . Chronic pain of right wrist   . Coronary artery disease    a. Multiple prior caths/PCI. Cath 2013 with possible spasm of RCA, 70% ISR of mid LCx with subsequent DES to mLCx and prox LCX. b. H/o microvascular angina. c. Recurrent angina 08/2014 - s/p PTCA/DES to prox Cx, PTCA/CBA to OM1.  c. LHC 06/10/15 with patent stents and some ISR in LCX and OM-1 that was not flow limiting --> Rx   . Dyslipidemia    a. Intolerant to many statins except tolerating Livalo.  Marland Kitchen GERD (gastroesophageal reflux disease)   . H/O cardiac catheterization 10/25/2018  . Heart attack ([place])   . Hypertension   . Myocardial infarction ([place]) ~ 2010  . S/P angioplasty with stent, DES, to proximal and mid LCX 12/15/11 12/15/2011  . Shoulder pain   . Stroke [facility])    pt. reports had a stroke around time of MI 2010  . Type II diabetes mellitus ([place])   . Unstable angina [facility])     Past Surgical History:  Procedure Laterality Date  . CARDIAC CATHETERIZATION  06/15/2002   LAD with prox 40% stenosis, norma L main, Cfx with 25% lesion, RCA with long mid 25% stenosis (Dr. Vita Barley)  . CARDIAC CATHETERIZATION  04/01/2010   normal L main, LAD wit mild stenosis, L Cfx with 70% in-stent restenosis, RCA with 70% in-stent restenosis, LVEF >60% (Dr. K. Mali Hilty) - cutting ballon arthrectomy to  RCA & Cfx (Dr. Loni Muse. Little)  . CARDIAC CATHETERIZATION  08/25/2010   preserved global LV contractility; multivessel CAD, diffuse 90-95% in-stent restenosis in prox placed Cfx stent - cutting balloon arthrectomy in Cfx with multiple dilatations 90-95% to 0% stenosis (Dr. Corky Downs)  . CARDIAC CATHETERIZATION  01/26/2011   PCI & stenting of aggresive in-stent restenosis within previously stented AV groove Cfx with 3.0x37mm Taxus DES (previous stents were Promus) (Dr. Adora Fridge)  . CARDIAC CATHETERIZATION  05/11/2011   preserved LV function, 40% mid LAD stenosis, 30-40% narrowing proximal to stented semgnet of prox Cfx, patent  mid RCA stent with smooth 20% narrowing in distal RCA (Dr. Corky Downs)  . CARDIAC CATHETERIZATION  12/15/2011   PCI & stenting of proximal & mid Cfx with DES - 3.0x30mm in proximal, 3.0x11mm in mid (Dr. Adora Fridge)  . CARDIAC CATHETERIZATION N/A 06/10/2015   Procedure: Left Heart Cath and Coronary Angiography;  Surgeon: Jolaine Artist, MD;  Location: [place] CV LAB;  Service: Cardiovascular;  Laterality: N/A;  . CARDIAC CATHETERIZATION  10/25/2018  . cardiometabolic testing  3/50/0938   good exercise effort, peak VO2 79% predicted with normal VO2 HR curves (mild deconditioning)  . COLONOSCOPY  12/2012   diminutive hyperplastic sigmoid poyp so repeat routine 2024  . CORONARY BALLOON ANGIOPLASTY N/A 10/25/2018   Procedure: CORONARY BALLOON ANGIOPLASTY;  Surgeon: Jettie Booze, MD;  Location: [place] CV LAB;  Service: Cardiovascular;  Laterality: N/A;  . CORONARY BALLOON ANGIOPLASTY N/A 09/29/2019   Procedure: CORONARY BALLOON ANGIOPLASTY;  Surgeon: Nelva Bush, MD;  Location: [place] CV LAB;  Service: Cardiovascular;  Laterality: N/A;  . EXCISIONAL HEMORRHOIDECTOMY  1984  . INTRAVASCULAR ULTRASOUND/IVUS N/A 09/29/2019   Procedure: Intravascular Ultrasound/IVUS;  Surgeon: Nelva Bush, MD;  Location: [place] CV LAB;  Service: Cardiovascular;  Laterality: N/A;  . LEFT HEART CATH AND CORONARY ANGIOGRAPHY N/A 10/25/2018   Procedure: LEFT HEART CATH AND CORONARY ANGIOGRAPHY;  Surgeon: Jettie Booze, MD;  Location: [place] CV LAB;  Service: Cardiovascular;  Laterality: N/A;  . LEFT HEART CATH AND CORONARY ANGIOGRAPHY N/A 09/29/2019   Procedure: LEFT HEART CATH AND CORONARY ANGIOGRAPHY;  Surgeon: Nelva Bush, MD;  Location: [place] CV LAB;  Service: Cardiovascular;  Laterality: N/A;  . LEFT HEART CATHETERIZATION WITH CORONARY ANGIOGRAM N/A 05/11/2011   Procedure: LEFT HEART CATHETERIZATION WITH CORONARY ANGIOGRAM;  Surgeon: Troy Sine, MD;  Location: [facility]  CATH LAB;  Service: Cardiovascular;  Laterality: N/A;  Possible percutaneous coronary intervention, possible IVUS  . LEFT HEART CATHETERIZATION WITH CORONARY ANGIOGRAM N/A 12/15/2011   Procedure: LEFT HEART CATHETERIZATION WITH CORONARY ANGIOGRAM;  Surgeon: Lorretta Harp, MD;  Location: [facility] CATH LAB;  Service: Cardiovascular;  Laterality: N/A;  . LEFT HEART CATHETERIZATION WITH CORONARY ANGIOGRAM N/A 09/05/2014   Procedure: LEFT HEART CATHETERIZATION WITH CORONARY ANGIOGRAM;  Surgeon: Burnell Blanks, MD;  Location: [facility] CATH LAB;  Service: Cardiovascular;  Laterality: N/A;  . LIPOMA EXCISION     back of the head  . NM MYOCAR PERF WALL MOTION  02/2012   lexiscan myoview; mild perfusion defect in mid inferolateral & basal inferolateral region (infarct/scar); EF 52%, abnormal but ow risk scan  . PERCUTANEOUS CORONARY STENT INTERVENTION (PCI-S)  09/05/2014   Procedure: PERCUTANEOUS CORONARY STENT INTERVENTION (PCI-S);  Surgeon: Burnell Blanks, MD;  Location: [facility] CATH LAB;  Service: Cardiovascular;;    Family History:  Family History  Problem Relation Age of Onset  . Leukemia Mother   . Prostate  cancer Father   . Coronary artery disease Paternal Grandmother   . Cancer Paternal Grandfather   . Cancer Brother     Social History:  reports that he quit smoking about 17 months ago. His smoking use included cigarettes. He has a 10.00 pack-year smoking history. He has never used smokeless tobacco. He reports current alcohol use. He reports current drug use. Drug: Cocaine.  Additional Social History:  Alcohol / Drug Use Pain Medications: See MAR Prescriptions: See MAR Over the Counter: See MAR History of alcohol / drug use?: Yes Longest period of sobriety (when/how long): 4 years and 1 month Substance #1 Name of Substance 1: Alcohol 1 - Age of First Use: 23 1 - Amount (size/oz): 22 oz beer and bottle of wine 1 - Frequency: weeks at time 1 - Duration: on going 1 - Last Use / Amount:  03/16/2020  CIWA: CIWA-Ar BP: 130/90 Pulse Rate: 65 COWS:    Allergies:  Allergies  Allergen Reactions  . Bee Venom Anaphylaxis and Hives  . Shellfish Allergy Anaphylaxis and Hives  . Statins Other (See Comments)    Myalgias. Tolerating livalo. Pain   . Statins   . Testosterone Cypionate     Testerone Injection --Increased breast tissue     Home Medications: (Not in a hospital admission)   OB/GYN Status:  No LMP for male patient.  General Assessment Data Location of Assessment: [facility] Assessment Services TTS Assessment: In system Is this a Tele or Face-to-Face Assessment?: Face-to-Face Is this an Initial Assessment or a Re-assessment for this encounter?: Initial Assessment Patient Accompanied by:: N/A Language Other than English: No Living Arrangements: Other (Comment) What gender do you identify as?: Male Marital status: Widowed [place] name:  (N/A) Pregnancy Status: No Living Arrangements: Alone Can pt return to current living arrangement?: Yes Admission Status: Voluntary Is patient capable of signing voluntary admission?: Yes Referral Source: Self/Family/Friend Insurance type:  (Medicaid)     Crisis Care Plan Living Arrangements: Alone Legal Guardian: Other: Name of Psychiatrist:  (PCP) Name of Therapist:  Engineer, structural)  Education Status Is patient currently in school?: No Is the patient employed, unemployed or receiving disability?: Unemployed, Receiving disability income  Risk to self with the past 6 months Suicidal Ideation: Yes-Currently Present Has patient been a risk to self within the past 6 months prior to admission? : No Suicidal Intent: Yes-Currently Present (Reports he will stop taking medications.) Has patient had any suicidal intent within the past 6 months prior to admission? : Yes Is patient at risk for suicide?: Yes Suicidal Plan?: Yes-Currently Present (Plans to stop taking medications ) Has patient had any suicidal plan within the  past 6 months prior to admission? : Yes Specify Current Suicidal Plan:  (Reports that he will stop taking his medications.) Access to Means: Yes What has been your use of drugs/alcohol within the last 12 months?:  (Alcohol use 03/16/2020) Previous Attempts/Gestures: Yes How many times?: 2 (2) Other Self Harm Risks:  (not attending to medical needs) Triggers for Past Attempts: Unknown Intentional Self Injurious Behavior: None Family Suicide History: Unknown Recent stressful life event(s): Loss (Comment) (multiple death in family) Persecutory voices/beliefs?: No Depression:  (yes) Depression Symptoms: Tearfulness, Isolating, Fatigue, Guilt, Loss of interest in usual pleasures, Feeling worthless/self pity Substance abuse history and/or treatment for substance abuse?: Yes Suicide prevention information given to non-admitted patients: Not applicable  Risk to Others within the past 6 months Homicidal Ideation: No Does patient have any lifetime risk of violence toward others  beyond the six months prior to admission? : No Thoughts of Harm to Others: No Current Homicidal Intent: No Current Homicidal Plan: No Access to Homicidal Means: No Identified Victim:  (None) History of harm to others?: No Violent Behavior Description:  (none) Does patient have access to weapons?: No Criminal Charges Pending?: No Does patient have a court date: No Is patient on probation?: No  Psychosis Hallucinations: None noted Delusions: None noted  Mental Status Report Appearance/Hygiene: Unremarkable Eye Contact: Fair Motor Activity: Unremarkable Speech: Soft Level of Consciousness: Alert Mood: Depressed, Helpless Affect: Depressed Anxiety Level: None Thought Processes: Coherent, Relevant Judgement: Partial Orientation: Person, Place, Time, Situation, Appropriate for developmental age Obsessive Compulsive Thoughts/Behaviors: None  Cognitive Functioning Concentration: Normal Memory: Recent Intact,  Remote Intact Is patient IDD: No Insight: Fair Impulse Control: Fair Appetite: Good Have you had any weight changes? : No Change Sleep: Increased Total Hours of Sleep:  (UTA) Vegetative Symptoms: Staying in bed  ADLScreening [facility] Assessment Services) Patient's cognitive ability adequate to safely complete daily activities?: No Patient able to express need for assistance with ADLs?: Yes Independently performs ADLs?: Yes (appropriate for developmental age)  Prior Inpatient Therapy Prior Inpatient Therapy: Yes Prior Therapy Dates:  (multiple) Prior Therapy Facilty/Provider(s):  ([facility]) Reason for Treatment:  (Depression and SI)  Prior Outpatient Therapy Prior Outpatient Therapy: Yes Prior Therapy Dates:  (last month) Prior Therapy Facilty/Provider(s):  ([facility]) Reason for Treatment:  (Depression and alcohol use) Does patient have an ACCT team?: No Does patient have Intensive In-House Services?  : No Does patient have [facility] services? : Yes Does patient have [facility] services?: No  ADL Screening (condition at time of admission) Patient's cognitive ability adequate to safely complete daily activities?: No Is the patient deaf or have difficulty hearing?: No Does the patient have difficulty seeing, even when wearing glasses/contacts?: No Does the patient have difficulty concentrating, remembering, or making decisions?: No Patient able to express need for assistance with ADLs?: Yes Does the patient have difficulty dressing or bathing?: No Independently performs ADLs?: Yes (appropriate for developmental age) Does the patient have difficulty walking or climbing stairs?: No Weakness of Legs: None Weakness of Arms/Hands: None  Home Assistive Devices/Equipment Home Assistive Devices/Equipment: None  Therapy Consults (therapy consults require a physician order) PT Evaluation Needed: No OT Evalulation Needed: No SLP Evaluation Needed: No Abuse/Neglect Assessment  (Assessment to be complete while patient is alone) Abuse/Neglect Assessment Can Be Completed: Yes Physical Abuse: Denies Verbal Abuse: Denies Sexual Abuse: Denies Exploitation of patient/patient's resources: Denies Self-Neglect: Yes, present (Comment) (PT not taking medications foe medical issues) Values / Beliefs Cultural Requests During Hospitalization: None Spiritual Requests During Hospitalization: None Consults Spiritual Care Consult Needed: No Transition of Care Team Consult Needed: No Advance Directives (For Healthcare) Does Patient Have a Medical Advance Directive?: No Would patient like information on creating a medical advance directive?: No - Patient declined          Disposition: Pt staffed with Elmarie Shiley, NP and Dr. Dwyane Dee. Recommendations are for inpatient treatment.   Disposition Initial Assessment Completed for this Encounter: Yes Disposition of Patient: Admit Type of inpatient treatment program: Adult Patient refused recommended treatment: No Mode of transportation if patient is discharged/movement?: Other (comment)   On Site Evaluation by:   Reviewed with Physician:    Luther Redo 03/18/2020 2:37 PM

## 2020-03-18 NOTE — H&P (Signed)
Behavioral Health Medical Screening Exam  [name] is a 59 y.o. male who presents as a walk in due to severe depression. States "I have not been taking care of myself. I just don't care. I have been drinking and eating junk food. I have not been taking my remeron. I believe that it was causing bruxism so I stopped it. I don't think that I have a Psychiatrist. I've been struggling for at least a month."  Total Time spent with patient: 20 minutes  Psychiatric Specialty Exam  Presentation  General Appearance:Appropriate for Environment  Eye Contact:Good  Speech:Clear and Coherent  Speech Volume:Normal  Handedness:Right   Mood and Affect  Mood:Depressed  Affect:Congruent   Thought Process  Thought Processes:Coherent  Descriptions of Associations:Intact  Orientation:Full (Time, Place and Person)  Thought Content:Logical  Hallucinations:None  Ideas of Reference:None  Suicidal Thoughts: Yes has not been taking "any of my medical medication for a while. I don't know the last time that I checked my blood sugar. I can tell you that my feet are burning." With Plan;With Means to Carry Out;With Intent;With Access to Means  Homicidal Thoughts:No   Sensorium  Memory:Recent Good  Judgment:Good  Insight:Good   Executive Functions  Concentration:Good  Attention Span:Fair  [name]  Language:Good   Psychomotor Activity  Psychomotor Activity:Normal   Assets  Assets:Communication Skills;Social Support;Financial Resources/Insurance   Sleep  Sleep:Fair  Number of hours: 8   Physical Exam: Physical Exam HENT:     Head: Normocephalic.  Cardiovascular:     Rate and Rhythm: Normal rate and regular rhythm.     Pulses: Normal pulses.     Heart sounds: Normal heart sounds.  Pulmonary:     Effort: Pulmonary effort is normal.     Breath sounds: Normal breath sounds.  Abdominal:     General: Abdomen is flat. Bowel sounds are  normal.  Skin:    General: Skin is warm and dry.  Neurological:     Mental Status: He is alert.    ROS Blood pressure 130/90, pulse 65, temperature 98.7 F (37.1 C). There is no height or weight on file to calculate BMI.  Musculoskeletal: Strength & Muscle Tone: within normal limits Gait & Station: normal Patient leans: N/A   Recommendations:  Based on my evaluation the patient does not appear to have an emergency medical condition.  Elmarie Shiley, NP 03/18/2020, 2:17 PM

## 2020-03-18 NOTE — Progress Notes (Addendum)
Patient ID: [name], male   DOB: [birth year], 59 y.o.   MRN: [id number] D: Pt here voluntarily as a walk-in to [facility]. Pt denies SI/HI/AVH and pain at this time and contracts for safety on the unit. Pt states that he has been suicidal with a plan to "not take my medicines." Pt identifies himself as an "alcoholic." He drinks one day a week but drinks a fifth and a half of gin at that time. His longest period of sobriety has been 4 years and 1 month. Pt is a former cocaine user and has CAD with multiple stents placed. he has a history of HTN. He has been diagnosed with stable angina and takes SL NTG PRN. Pt wants to work on improving his mood and finding an antidepressant that works for him.   Per Assessment Note:  [name] is an 59 y.o. male presenting to [facility] with reports of increased depressive symptoms and suicidal ideations. Pt was seen at [facility] ED the past few days with similar presentation. Pt reports that his mental health has been declining and as a result his physical health is being affected. Pt states he is finding it difficult to get out of bed and take care of himself. For the past month client reports not taking any medications, not attending his support groups and not going to therapy. Pt also reports the death of his mother, wife, older brother, nephew and two cousins over the past two years.  Pt reports a history of mental health services from [employer] and [place]. Pt reports that his PCP is Kathe Becton, NP and she is prescribing his medications at this time. Pt sees Lamount Cohen at [place] for therapy once a month and attends support group at [facility]. Pt also reports attending [place] meetings but has not done so recently. Pt reports symptoms of isolating, sleeping and eating more, feeling hopeless, helpless and worthless, anhedonia and passive suicidal ideation.  Pt has a history of alcohol use starting at the age of 89. Pt reports that his longest period of abstinence was 4 years and 1 month. Pt  reports drinking a 22oz can of beer and a bottle of "grape wine" on 03/16/2020 after being discharged from [facility]. Pt reports drinking several weeks ago before then.  Pt denies HI/AVH and SIB. Pt presents with suicidal ideations with a plan of not taking his medications. Pt has two prior suicidal attempts (driving car off bridge and OD). Pt could not contract for safety and could not provide any protective factors.   A: Pt was offered support and encouragement. Pt is cooperative during assessment. VS assessed and admission paperwork signed. Belongings searched and contraband items placed in locker. Non-invasive skin search completed at beginning of shift. Pt offered food and drink previously and both accepted. Pt introduced to unit milieu by nursing staff. Q 15 minute checks were started for safety.   R: Pt in dayroom. Pt safety maintained on unit.

## 2020-03-19 DIAGNOSIS — F1024 Alcohol dependence with alcohol-induced mood disorder: Secondary | ICD-10-CM

## 2020-03-19 DIAGNOSIS — F322 Major depressive disorder, single episode, severe without psychotic features: Secondary | ICD-10-CM

## 2020-03-19 LAB — GLUCOSE, CAPILLARY
Glucose-Capillary: 128 mg/dL — ABNORMAL HIGH (ref 70–99)
Glucose-Capillary: 92 mg/dL (ref 70–99)
Glucose-Capillary: 103 mg/dL — ABNORMAL HIGH (ref 70–99)
Glucose-Capillary: 120 mg/dL — ABNORMAL HIGH (ref 70–99)

## 2020-03-19 LAB — TSH: TSH: 1.089 u[IU]/mL (ref 0.350–4.500)

## 2020-03-19 MED ORDER — TICAGRELOR 90 MG PO TABS
90.0000 mg | ORAL_TABLET | Freq: Two times a day (BID) | ORAL | Status: DC
  Administered 2020-03-19 – 2020-03-21 (×5): 90 mg via ORAL
  Filled 2020-03-19 (×9): qty 1

## 2020-03-19 MED ORDER — LISINOPRIL 10 MG PO TABS
10.0000 mg | ORAL_TABLET | Freq: Every day | ORAL | Status: DC
  Administered 2020-03-19 – 2020-03-21 (×3): 10 mg via ORAL
  Filled 2020-03-19 (×5): qty 1

## 2020-03-19 MED ORDER — ISOSORBIDE MONONITRATE ER 60 MG PO TB24
90.0000 mg | ORAL_TABLET | Freq: Every day | ORAL | Status: DC
  Administered 2020-03-19 – 2020-03-21 (×3): 90 mg via ORAL
  Filled 2020-03-19 (×5): qty 1

## 2020-03-19 NOTE — [facility] Suicide Risk Assessment (Signed)
[facility] Admission Suicide Risk Assessment   Nursing information obtained from:  Patient Demographic factors:  Male, Unemployed, Living alone Current Mental Status:  NA Loss Factors:  NA Historical Factors:  Prior suicide attempts, Family history of mental illness or substance abuse Risk Reduction Factors:  Positive therapeutic relationship, Positive social support  Total Time spent with patient: 30 minutes Principal Problem: <principal problem not specified> Diagnosis:  Active Problems:   MDD (major depressive disorder), recurrent episode, severe ([place])  Subjective Data: Patient is seen and examined.  Patient is a 59 year old male with a past psychiatric history significant for alcohol dependence, depression who presented originally to the behavioral health urgent care center on 03/16/2020 with depression and request for change of medications.  He stated at that time he had been given mirtazapine, but was having bruxism.  He also stated he was having bad drinks.  He described his mood as being "unbelievably terrible".  He admitted to crying spells, helplessness, hopelessness and worthlessness.  He stated he stays in bed all day long.  His last psychiatric admission in our facility was in June 2021.  He again reported homicidal ideation towards a man who owed him money.  He also stated "I was doing really well for a long time taking my antidepressant (Remeron), I was feeling better and everything was going okay.  He stated that he had a routine dental exam and that it that was from bruxism most likely secondary to the antidepressant.  He had described passive suicidal ideation and alcohol issues as well.  His urine drug screen at that time was positive for cocaine but negative for alcohol.  His discharge medications at that time included amlodipine, aspirin, isosorbide, lisinopril, Metformin, mirtazapine, nitroglycerin and ticagrelor.  He stated he is only had a couple of days in a row of sobriety since that  hospitalization.  He was seen in the [facility] behavioral health urgent care center on 2 days, and he was initiated on Zoloft therapy and the mirtazapine was stopped.  He was transferred to our facility for evaluation and stabilization.  Continued Clinical Symptoms:  Alcohol Use Disorder Identification Test Final Score (AUDIT): 28 The "Alcohol Use Disorders Identification Test", Guidelines for Use in Primary Care, Second Edition.  World Pharmacologist [facility]). Score between 0-7:  no or low risk or alcohol related problems. Score between 8-15:  moderate risk of alcohol related problems. Score between 16-19:  high risk of alcohol related problems. Score 20 or above:  warrants further diagnostic evaluation for alcohol dependence and treatment.   CLINICAL FACTORS:   Depression:   Anhedonia Comorbid alcohol abuse/dependence Hopelessness Impulsivity Alcohol/Substance Abuse/Dependencies   Musculoskeletal: Strength & Muscle Tone: within normal limits Gait & Station: normal Patient leans: N/A  Psychiatric Specialty Exam: Physical Exam Vitals and nursing note reviewed.  Constitutional:      Appearance: Normal appearance.  HENT:     Head: Normocephalic and atraumatic.  Pulmonary:     Effort: Pulmonary effort is normal.  Neurological:     General: No focal deficit present.     Mental Status: He is alert and oriented to person, place, and time.     Review of Systems  Blood pressure 131/88, pulse 79, temperature (!) 97.4 F (36.3 C), temperature source Oral, resp. rate 16, SpO2 100 %.There is no height or weight on file to calculate BMI.  General Appearance: Casual  Eye Contact:  Good  Speech:  Normal Rate  Volume:  Normal  Mood:  Euthymic  Affect:  Congruent  Thought Process:  Coherent and Descriptions of Associations: Circumstantial  Orientation:  Full (Time, Place, and Person)  Thought Content:  Logical  Suicidal Thoughts:  Yes.  without intent/plan  Homicidal  Thoughts:  No  Memory:  Immediate;   Fair Recent;   Fair Remote;   Fair  Judgement:  Intact  Insight:  Lacking  Psychomotor Activity:  Normal  Concentration:  Concentration: Good and Attention Span: Good  Recall:  Good  Fund of Knowledge:  Good  Language:  Good  Akathisia:  Negative  Handed:  Right  AIMS (if indicated):     Assets:  Desire for Improvement Resilience  ADL's:  Intact  Cognition:  WNL  Sleep:         COGNITIVE FEATURES THAT CONTRIBUTE TO RISK:  None    SUICIDE RISK:   Minimal: No identifiable suicidal ideation.  Patients presenting with no risk factors but with morbid ruminations; may be classified as minimal risk based on the severity of the depressive symptoms  PLAN OF CARE: Patient is seen and examined.  Patient is a 59 year old male with the above-stated past psychiatric history was admitted for evaluation and stabilization.  He will be admitted to the hospital.  He will be integrated in the milieu.  He will be encouraged to attend groups.  He has already had his antidepressant medication switched to Zoloft, and we will increase that to 50 mg p.o. daily.  Given his continued alcohol use we will use lorazepam 1 mg p.o. every 6 hours as needed a CIWA greater than 10.  We will continue his diabetic medications, but also put in place a sliding scale in case.  He is on metoprolol for blood pressure as well as heart health.  Review of his last cardiology note from 01/04/2020 showed he continued treatment with ticagrelor, lisinopril, isosorbide, Metformin, amlodipine and aspirin.  Metoprolol was not on that list.  We will contact his pharmacy and make sure about that medication.  We will restart all the others.  He is requesting residential substance abuse treatment, and I have asked him to contact social work to begin that process.  His vital signs are stable, he is afebrile.  Review of his admission laboratories revealed essentially normal electrolytes including liver function  enzymes.  His blood sugar this morning was 128.  Lipid panel showed increased triglycerides at 237.  His CBC was normal including platelets.  MCV and MCH were normal.  Differential was normal.  His prolactin was 13.  His hemoglobin A1c is 6.4.  His TSH was mildly elevated at 4.162.  His blood alcohol on admission was 18.  Drug screen was completely negative.  An EKG is yet to be obtained.  I certify that inpatient services furnished can reasonably be expected to improve the patient's condition.   Sharma Covert, MD 03/19/2020, 10:23 AM

## 2020-03-19 NOTE — Progress Notes (Signed)
D:  Patient's self inventory sheet, patient sleeps good, no sleep medication.  Good appetite, low energy level, poor concentration.   Rated depression 8, hopeless 7, anxiety 5.  Denied withdrawals.  Denied SI.  Denied physical problems.  Denied physical pain.  Goal is increase mood.  Plans to stay out of bed.  "Staff is doing a great job."  No discharge plans. A:  Medications administered per MD orders.  Emotional support and encouragement given patient. R:  Denied SI and HI, contracts for safety.  Denied A/V hallucinations.  Safety maintained with 15 minute checks.

## 2020-03-19 NOTE — Progress Notes (Signed)
Psychoeducational Group Note  Date:  03/19/2020 Time:  2127  Group Topic/Focus:  Wrap-Up Group:   The focus of this group is to help patients review their daily goal of treatment and discuss progress on daily workbooks.  Participation Level: Did Not Attend  Participation Quality:  Not Applicable  Affect:  Not Applicable  Cognitive:  Not Applicable  Insight:  Not Applicable  Engagement in Group: Not Applicable  Additional Comments:  Patient did not attend group this evening.   Archie Balboa S 03/19/2020, 9:27 PM

## 2020-03-19 NOTE — Progress Notes (Addendum)
Recreation Therapy Notes  Animal-Assisted Activity (AAA) Program Checklist/Progress Notes Patient Eligibility Criteria Checklist & Daily Group note for Rec Tx Intervention  Date: 9.21.21 Time: 79 Location: [address]   AAA/T Program Assumption of Risk Form signed by Teacher, music or Parent Legal Guardian  YES   Patient is free of allergies or sever asthma  YES   Patient reports no fear of animals  YES  Patient reports no history of cruelty to animals YES  Patient understands his/her participation is voluntary YES   Patient washes hands before animal contact  YES  Patient washes hands after animal contact  YES   Behavioral Response: None  Education: Contractor, Appropriate Animal Interaction   Education Outcome: Acknowledges understanding/In group clarification offered/Needs additional education.   Clinical Observations/Feedback: Pt attended activity.    [name], LRT/CTRS         [name] A 03/19/2020 3:40 PM

## 2020-03-19 NOTE — Progress Notes (Signed)
   03/18/20 2100  Psych Admission Type (Psych Patients Only)  Admission Status Voluntary  Psychosocial Assessment  Patient Complaints Anxiety;Appetite increase;Decreased concentration;Sadness;Substance abuse (EtoH abuse)  Eye Contact Brief  Facial Expression Flat  Affect Depressed  Speech Soft  Interaction Assertive  Motor Activity Other (Comment) (wnl)  Appearance/Hygiene Unremarkable  Behavior Characteristics Cooperative  Mood Depressed  Thought Process  Coherency WDL  Content WDL  Delusions None reported or observed  Perception WDL  Hallucination None reported or observed  Judgment Poor  Confusion None  Danger to Self  Current suicidal ideation? Denies  Danger to Others  Danger to Others None reported or observed   Pt denies SI but endorses it within the past 3 months. Pt contracts for safety on the unit. Pt states that he sees a therapist and names is older sister as his social support.

## 2020-03-19 NOTE — [facility] Group Notes (Signed)
Patient did not attend wrap up group. 

## 2020-03-19 NOTE — H&P (Signed)
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  Psychotherapy:    Medications:    Consultations:    Discharge Concerns:    Estimated LOS:  Other:     Physician Treatment Plan for Primary Diagnosis: MDD (major depressive disorder), severe ([place]) Long Term Goal(s): Improvement in symptoms so as ready for discharge  Short Term Goals: Ability to identify changes in lifestyle to reduce recurrence of condition will improve, Ability to verbalize feelings will improve, Ability to disclose and discuss suicidal ideas, Ability to demonstrate self-control will improve, Ability to identify and develop effective coping behaviors will improve, Ability to maintain clinical measurements within normal limits will improve, Compliance with prescribed medications will improve and Ability to identify triggers associated with substance abuse/mental health issues will improve  Physician  Treatment Plan for Secondary Diagnosis: Principal Problem:   MDD (major depressive disorder), severe ([place]) Active Problems:   DM2 (diabetes mellitus, type 2) ([place])   CAD S/P percutaneous coronary angioplasty   Dyslipidemia, goal LDL below 70   Essential hypertension   Alcohol dependence ([place])  Long Term Goal(s): Improvement in symptoms so as ready for discharge  Short Term Goals: Ability to identify changes in lifestyle to reduce recurrence of condition will improve, Ability to verbalize feelings will improve, Ability to disclose and discuss suicidal ideas, Ability to demonstrate self-control will improve, Ability to identify and develop effective coping behaviors will improve, Ability to maintain clinical measurements within normal limits will improve, Compliance with prescribed medications will improve and Ability to identify triggers associated with substance abuse/mental health issues will improve  I certify that inpatient services furnished can reasonably be expected to improve the patient's condition.    Armando Reichert, MD 9/21/20211:19 PM

## 2020-03-19 NOTE — Progress Notes (Signed)
   03/19/20 2107  Psych Admission Type (Psych Patients Only)  Admission Status Voluntary  Psychosocial Assessment  Patient Complaints None  Eye Contact Brief  Facial Expression Flat  Affect Appropriate to circumstance  Speech Soft  Interaction Assertive  Motor Activity Other (Comment) (wnl)  Appearance/Hygiene Unremarkable  Behavior Characteristics Cooperative  Mood Pleasant  Thought Process  Coherency WDL  Content WDL  Delusions None reported or observed  Perception WDL  Hallucination None reported or observed  Judgment Poor  Confusion None  Danger to Self  Current suicidal ideation? Denies  Danger to Others  Danger to Others None reported or observed   Pt denies any complaints today. Pt denies any side effects from Zoloft. Pt states that he discussed starting Wellbutrin with the provider. Did not see that medication in his profile. Advised pt to bring this up again with provider in the morning. Pt rates pain 8/10 as lower back pain. Pt states that previous dose of Tylenol didn't help. Will contact provider for ibuprofen.

## 2020-03-20 LAB — T3 UPTAKE: T3 Uptake Ratio: 24 % (ref 24–39)

## 2020-03-20 LAB — GLUCOSE, CAPILLARY
Glucose-Capillary: 128 mg/dL — ABNORMAL HIGH (ref 70–99)
Glucose-Capillary: 116 mg/dL — ABNORMAL HIGH (ref 70–99)
Glucose-Capillary: 109 mg/dL — ABNORMAL HIGH (ref 70–99)
Glucose-Capillary: 124 mg/dL — ABNORMAL HIGH (ref 70–99)

## 2020-03-20 LAB — T4: T4, Total: 5.6 ug/dL (ref 4.5–12.0)

## 2020-03-20 MED ORDER — IBUPROFEN 800 MG PO TABS
800.0000 mg | ORAL_TABLET | Freq: Once | ORAL | Status: AC
  Administered 2020-03-20: 800 mg via ORAL
  Filled 2020-03-20 (×2): qty 1

## 2020-03-20 MED ORDER — SERTRALINE HCL 50 MG PO TABS
50.0000 mg | ORAL_TABLET | Freq: Every day | ORAL | Status: DC
  Administered 2020-03-20 – 2020-03-21 (×2): 50 mg via ORAL
  Filled 2020-03-20 (×4): qty 1

## 2020-03-20 NOTE — Tx Team (Signed)
Interdisciplinary Treatment and Diagnostic Plan Update  03/20/2020 Time of Session: 1010 [name] MRN: [id number]  Principal Diagnosis: MDD (major depressive disorder), severe ([name])  Secondary Diagnoses: Principal Problem:   MDD (major depressive disorder), severe ([name]) Active Problems:   DM2 (diabetes mellitus, type 2) ([name])   CAD S/P percutaneous coronary angioplasty   Dyslipidemia, goal LDL below 70   Essential hypertension   Alcohol dependence ([name])   Current Medications:  Current Facility-Administered Medications  Medication Dose Route Frequency Provider Last Rate Last Admin  . acetaminophen (TYLENOL) tablet 650 mg  650 mg Oral Q6H PRN Davis, Laura A, NP   650 mg at 03/19/20 1735  . alum & mag hydroxide-simeth (MAALOX/MYLANTA) 200-200-20 MG/5ML suspension 30 mL  30 mL Oral Q4H PRN Davis, Laura A, NP      . amLODipine (NORVASC) tablet 5 mg  5 mg Oral Daily Clary, Greg Lawson, MD   5 mg at 03/20/20 0814  . aspirin EC tablet 81 mg  81 mg Oral Daily Clary, Greg Lawson, MD   81 mg at 03/20/20 0815  . folic acid (FOLVITE) tablet 1 mg  1 mg Oral Daily Clary, Greg Lawson, MD   1 mg at 03/20/20 0815  . hydrOXYzine (ATARAX/VISTARIL) tablet 25 mg  25 mg Oral TID PRN Davis, Laura A, NP   25 mg at 03/19/20 2108  . insulin aspart (novoLOG) injection 0-15 Units  0-15 Units Subcutaneous TID WC Davis, Laura A, NP   2 Units at 03/20/20 0614  . insulin aspart (novoLOG) injection 0-5 Units  0-5 Units Subcutaneous QHS Davis, Laura A, NP      . isosorbide mononitrate (IMDUR) 24 hr tablet 90 mg  90 mg Oral Daily Clary, Greg Lawson, MD   90 mg at 03/20/20 0817  . lisinopril (ZESTRIL) tablet 10 mg  10 mg Oral Daily Clary, Greg Lawson, MD   10 mg at 03/20/20 0817  . LORazepam (ATIVAN) tablet 1 mg  1 mg Oral Q6H PRN Clary, Greg Lawson, MD      . magnesium hydroxide (MILK OF MAGNESIA) suspension 30 mL  30 mL Oral Daily PRN Davis, Laura A, NP   30 mL at 03/19/20 1735  . metoprolol tartrate (LOPRESSOR)  tablet 100 mg  100 mg Oral BID Clary, Greg Lawson, MD   100 mg at 03/20/20 0816  . nitroGLYCERIN (NITROSTAT) SL tablet 0.4 mg  0.4 mg Sublingual Q5 min PRN Clary, Greg Lawson, MD      . sertraline (ZOLOFT) tablet 50 mg  50 mg Oral Daily Doda, Vandana, MD   50 mg at 03/20/20 0816  . thiamine tablet 100 mg  100 mg Oral Daily Clary, Greg Lawson, MD   100 mg at 03/20/20 0816  . ticagrelor (BRILINTA) tablet 90 mg  90 mg Oral BID Clary, Greg Lawson, MD   90 mg at 03/20/20 0817  . traZODone (DESYREL) tablet 50 mg  50 mg Oral QHS PRN Davis, Laura A, NP   50 mg at 03/19/20 2108   PTA Medications: Medications Prior to Admission  Medication Sig Dispense Refill Last Dose  . amLODipine (NORVASC) 10 MG tablet Take 1 tablet (10 mg total) by mouth daily. 90 tablet 2   . co-enzyme Q-10 50 MG capsule Take 50 mg by mouth daily.     . metFORMIN (GLUCOPHAGE) 500 MG tablet TAKE 1 TABLET BY MOUTH TWICE DAILY WITH A MEAL 180 tablet 2   . Pitavastatin Calcium (LIVALO) 4 MG TABS Take 4 mg by   mouth daily. Take 4 mg by mouth daily     . ticagrelor (BRILINTA) 90 MG TABS tablet Take 1 tablet (90 mg total) by mouth 2 (two) times daily. 180 tablet 2   . nitroGLYCERIN (NITROSTAT) 0.4 MG SL tablet Place 1 tablet (0.4 mg total) under the tongue every 5 (five) minutes x 3 doses as needed for chest pain. 25 tablet 12     Patient Stressors: Substance abuse  Patient Strengths: Average or above average intelligence Capable of independent living Motivation for treatment/growth Supportive family/friends  Treatment Modalities: Medication Management, Group therapy, Case management,  1 to 1 session with clinician, Psychoeducation, Recreational therapy.   Physician Treatment Plan for Primary Diagnosis: MDD (major depressive disorder), severe ([place]) Long Term Goal(s): Improvement in symptoms so as ready for discharge Improvement in symptoms so as ready for discharge   Short Term Goals: Ability to identify changes in lifestyle to  reduce recurrence of condition will improve Ability to verbalize feelings will improve Ability to disclose and discuss suicidal ideas Ability to demonstrate self-control will improve Ability to identify and develop effective coping behaviors will improve Ability to maintain clinical measurements within normal limits will improve Compliance with prescribed medications will improve Ability to identify triggers associated with substance abuse/mental health issues will improve Ability to identify changes in lifestyle to reduce recurrence of condition will improve Ability to verbalize feelings will improve Ability to disclose and discuss suicidal ideas Ability to demonstrate self-control will improve Ability to identify and develop effective coping behaviors will improve Ability to maintain clinical measurements within normal limits will improve Compliance with prescribed medications will improve Ability to identify triggers associated with substance abuse/mental health issues will improve  Medication Management: Evaluate patient's response, side effects, and tolerance of medication regimen.  Therapeutic Interventions: 1 to 1 sessions, Unit Group sessions and Medication administration.  Evaluation of Outcomes: Not Met  Physician Treatment Plan for Secondary Diagnosis: Principal Problem:   MDD (major depressive disorder), severe ([name]) Active Problems:   DM2 (diabetes mellitus, type 2) ([place])   CAD S/P percutaneous coronary angioplasty   Dyslipidemia, goal LDL below 70   Essential hypertension   Alcohol dependence ([place])  Long Term Goal(s): Improvement in symptoms so as ready for discharge Improvement in symptoms so as ready for discharge   Short Term Goals: Ability to identify changes in lifestyle to reduce recurrence of condition will improve Ability to verbalize feelings will improve Ability to disclose and discuss suicidal ideas Ability to demonstrate self-control will  improve Ability to identify and develop effective coping behaviors will improve Ability to maintain clinical measurements within normal limits will improve Compliance with prescribed medications will improve Ability to identify triggers associated with substance abuse/mental health issues will improve Ability to identify changes in lifestyle to reduce recurrence of condition will improve Ability to verbalize feelings will improve Ability to disclose and discuss suicidal ideas Ability to demonstrate self-control will improve Ability to identify and develop effective coping behaviors will improve Ability to maintain clinical measurements within normal limits will improve Compliance with prescribed medications will improve Ability to identify triggers associated with substance abuse/mental health issues will improve     Medication Management: Evaluate patient's response, side effects, and tolerance of medication regimen.  Therapeutic Interventions: 1 to 1 sessions, Unit Group sessions and Medication administration.  Evaluation of Outcomes: Not Met   RN Treatment Plan for Primary Diagnosis: MDD (major depressive disorder), severe ([place]) Long Term Goal(s): Knowledge of disease and therapeutic regimen to maintain health will  improve  Short Term Goals: Ability to participate in decision making will improve, Ability to verbalize feelings will improve and Ability to identify and develop effective coping behaviors will improve  Medication Management: RN will administer medications as ordered by provider, will assess and evaluate patient's response and provide education to patient for prescribed medication. RN will report any adverse and/or side effects to prescribing provider.  Therapeutic Interventions: 1 on 1 counseling sessions, Psychoeducation, Medication administration, Evaluate responses to treatment, Monitor vital signs and CBGs as ordered, Perform/monitor CIWA, COWS, AIMS and Fall Risk  screenings as ordered, Perform wound care treatments as ordered.  Evaluation of Outcomes: Not Met   LCSW Treatment Plan for Primary Diagnosis: MDD (major depressive disorder), severe ([name]) Long Term Goal(s): Safe transition to appropriate next level of care at discharge, Engage patient in therapeutic group addressing interpersonal concerns.  Short Term Goals: Engage patient in aftercare planning with referrals and resources, Facilitate patient progression through stages of change regarding substance use diagnoses and concerns and Identify triggers associated with mental health/substance abuse issues  Therapeutic Interventions: Assess for all discharge needs, 1 to 1 time with Social worker, Explore available resources and support systems, Assess for adequacy in community support network, Educate family and significant other(s) on suicide prevention, Complete Psychosocial Assessment, Interpersonal group therapy.  Evaluation of Outcomes: Not Met   Progress in Treatment: Attending groups: No. Participating in groups: No. Taking medication as prescribed: Yes. Toleration medication: Yes. Family/Significant other contact made: No, will contact:  SW will discuss with Pt. Patient understands diagnosis: Yes. Discussing patient identified problems/goals with staff: Yes. Medical problems stabilized or resolved: Yes. Denies suicidal/homicidal ideation: Yes. Issues/concerns per patient self-inventory: No. Other: None  New problem(s) identified: No, Describe:  None  New Short Term/Long Term Goal(s): SW encouraged Pt to attend groups in order to learn new and transferable coping skills. Pt's medication has been increased and he will continue to be monitored.  Patient Goals:  "I need to get back on my anti-depressant"  Discharge Plan or Barriers: SW will continue to assess.  Reason for Continuation of Hospitalization: Medication stabilization  Estimated Length of Stay: 3-5  Days  Attendees: Patient: [name] 03/20/2020 10:56 AM  Physician: Dr. Greg Clary 03/20/2020 10:56 AM  Nursing:  03/20/2020 10:56 AM  RN Care Manager: 03/20/2020 10:56 AM  Social Worker:  N. , LCSW 03/20/2020 10:56 AM  Recreational Therapist:  03/20/2020 10:56 AM  Other:  03/20/2020 10:56 AM  Other:  03/20/2020 10:56 AM  Other: 03/20/2020 10:56 AM    Scribe for Treatment Team:  N , LCSW 03/20/2020 10:56 AM 

## 2020-03-20 NOTE — Progress Notes (Signed)
   03/20/20 2049  Psych Admission Type (Psych Patients Only)  Admission Status Voluntary  Psychosocial Assessment  Patient Complaints None  Eye Contact Brief  Facial Expression Flat  Affect Appropriate to circumstance  Speech Soft  Interaction Assertive  Motor Activity Other (Comment) (wnl)  Appearance/Hygiene Unremarkable  Behavior Characteristics Cooperative  Mood Pleasant  Thought Process  Coherency WDL  Content WDL  Delusions None reported or observed  Perception WDL  Hallucination None reported or observed  Judgment Poor  Confusion None  Danger to Self  Current suicidal ideation? Denies  Danger to Others  Danger to Others None reported or observed   Pt states that he feels good today. Only complaint is toothache on the left side of his mouth. Missed a recent dental appointment for a filling. Rates pain 2/10 d/t sensitivity to hot and cold on the tooth. Pt states he is tolerating the Zoloft well. Pt stated that provider decided against trying Wellbutrin d/t pt's heart issues.

## 2020-03-20 NOTE — Progress Notes (Signed)
Patient states that he learned today about the "grounding technique". His goal for tomorrow is to address the discharge planning and to focus on the "hear and now".

## 2020-03-20 NOTE — B[facility] Counselor (Signed)
Adult Comprehensive Assessment  Patient ID: [name], male   DOB: 10-06-1960, 59 y.o.   MRN: [id number]  Information Source: Information source: Patient  Current Stressors:  Patient states their primary concerns and needs for treatment are:: Major depression and off my medicine Patient states their goals for this hospitilization and ongoing recovery are:: To improve my mood and adress my alcoholism. Was clean for 4 years and is now trying to get back to sobriety Educational / Learning stressors: no Employment / Job issues: Diasabled on SSDI Family Relationships: no Museum/gallery curator / Lack of resources (include bankruptcy): no Housing / Lack of housing: no Physical health (include injuries & life threatening diseases): Heart attack 9 years ago, diabetic Social relationships: Introverted and values time alone. Patient is a widower Substance abuse: alcoholism Bereavement / Loss: Lost wife to breast cancer, mother died from [place], lost older brother and 53 year old nephew from a car accident.  Living/Environment/Situation:  Living Arrangements: Alone Living conditions (as described by patient or guardian): good Who else lives in the home?: lives alone How long has patient lived in current situation?: years What is atmosphere in current home: Comfortable  Family History:  Marital status: Widowed Widowed, when?: 2019 Are you sexually active?: No ([place]) What is your sexual orientation?: heterosexual Has your sexual activity been affected by drugs, alcohol, medication, or emotional stress?: N/A Does patient have children?: Yes (2 adult children 78 and 25) How many children?: 2 How is patient's relationship with their children?: Close relationship  Childhood History:  By whom was/is the patient raised?: Both parents Description of patient's relationship with caregiver when they were a child: Good relationship but dysfunctional with alcoholism and depression Patient's  description of current relationship with people who raised him/her: Parents are deceased How were you disciplined when you got in trouble as a child/adolescent?: Spanked Does patient have siblings?: Yes Number of Siblings: 59 (Patient is twin.) Description of patient's current relationship with siblings: Get along but really close Did patient suffer any verbal/emotional/physical/sexual abuse as a child?: No Did patient suffer from severe childhood neglect?: No Has patient ever been sexually abused/assaulted/raped as an adolescent or adult?: No Was the patient ever a victim of a crime or a disaster?: No Witnessed domestic violence?: No Has patient been affected by domestic violence as an adult?: No  Education:  Highest grade of school patient has completed: Psychiatrist grad and some college Currently a student?: No Learning disability?: No  Employment/Work Situation:   Employment situation: On disability Why is patient on disability: Heart How long has patient been on disability: 8 What is the longest time patient has a held a job?: 12 years, 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, Has patient ever been in the [employer]?: No  Financial Resources:   Financial resources: Teacher, early years/pre Does patient have a Programmer, applications or guardian?: No  Alcohol/Substance Abuse:   What has been your use of drugs/alcohol within the last 12 months?: series of binges If attempted suicide, did drugs/alcohol play a role in this?: No (thinking) Alcohol/Substance Abuse Treatment Hx: Past Tx, Inpatient If yes, describe treatment: residential treatment and was helpful and managed to stay clean for 4 years Has alcohol/substance abuse ever caused legal problems?: Yes (DUI's)  Social Support System:   Patient's Community Support System: Fair ("I have trust issues") Describe Warehouse manager System: Sponsor, AA support group Type of faith/religion: Darrick Meigs How does patient's  faith help to cope with current illness?: prayer  Leisure/Recreation:   Do You Have Hobbies?: Yes Leisure and Hobbies: Musician, ride [employer], Engineer, maintenance  Strengths/Needs:   What is the patient's perception of their strengths?: Devoted to God, laughter,sense of humor, good at managing money Patient states they can use these personal strengths during their treatment to contribute to their recovery: Keep them primary Patient states these barriers may affect/interfere with their treatment: People-Concentrate on the basis of recovery Patient states these barriers may affect their return to the community: see above Other important information patient would like considered in planning for their treatment: Feels like residential treatment is where he need to be in order to maintain sobriety and restablished AA routine  Discharge Plan:   Currently receiving community mental health services: Yes (From Whom) Patient states concerns and preferences for aftercare planning are: [place] therapist and medication management Patient states they will know when they are safe and ready for discharge when: I am ready now Does patient have access to transportation?: Yes Does patient have financial barriers related to discharge medications?: No Patient description of barriers related to discharge medications: n/a Will patient be returning to same living situation after discharge?: Yes  Summary/Recommendations:   Summary and Recommendations (to be completed by the evaluator): Pt is a 59 year old male whole presented voluntarily to [facility] walk in on 03/16/2020 with worsening depression and suicidal ideation. He reported that he needs different medications for his depression. He voiced his medications are not working for him. As per initial evaluation, he stated he had been prescribed Mirtazapine and is experiencing teeth grinding and bad dreams, which he does not take anymore. He acknowledged  symptoms including crying spells, social withdrawal, loss of interest in usual pleasures, fatigue, decreased concentration, increased sleep, increased appetite, and feelings of worthlessness and hopelessness. He is widowed and lives in [place] alone. He is on Disability because of his heart problems.   Patient will benefit from crisis stabilization, medication evaluation, group therapy and psychoeducation, in addition to case management for discharge planning. At discharge it is recommended that Patient adhere to the established discharge plan and continue in treatment.  Anticipated outcomes: Mood will be stabilized, crisis will be stabilized, medications will be established if appropriate, coping skills will be taught and practiced, family session will be done to determine discharge plan, mental illness will be normalized, patient will be better equipped to recognize symptoms and ask for assistance.   Rolanda Jay. 03/20/2020

## 2020-03-20 NOTE — Progress Notes (Signed)
   03/20/20 0610  Vital Signs  Pulse Rate 60  BP (!) 123/92  BP Location Right Arm  BP Method Automatic  Patient Position (if appropriate) Standing   D: Patient denies SI/HI/AVH. Pt. Rates both anxiety and depression 2/10. Pt. Isolates in room. A:  Patient took scheduled medicine.  Support and encouragement provided Routine safety checks conducted every 15 minutes. Patient  Informed to notify staff with any concerns.   R: Safety maintained.

## 2020-03-20 NOTE — [facility] Suicide Risk Assessment (Signed)
[facility] INPATIENT:  Family/Significant Other Suicide Prevention Education  Suicide Prevention Education:  Patient Refusal for Family/Significant Other Suicide Prevention Education: The patient [name] has refused to provide written consent for family/significant other to be provided Family/Significant Other Suicide Prevention Education during admission and/or prior to discharge. CSW provided SPE to patient.  Physician notified.  Rolanda Jay 03/20/2020, 1:34 PM

## 2020-03-20 NOTE — Progress Notes (Signed)
[facility] MD Progress Note  03/20/2020 7:48 AM [name]  MRN:  [id number] Subjective:  Pt is seen and examined today. Pt states his mood is good. He rates her mood at 8/10 (10 is the best mood). Pt slept well last night. Nursing notes indicate that Pt slept for 5.5 hours. Pt states he is feeling good and is ready to start the day. He states he is interested in going to Alcohol addiction program because he is done going through this vicious circle again and again. He states he had been going to Outpatient Program at [facility] 3/week but it was not helping. Pt stopped going 1 there 1 month ago because of worsening depression. Pt states now he wants to go through something intensive.  He rents an appartment and denies any problem with paying rent. He states he want to stay at home but has to go to some good program to get better. Pt states he attended group and it helped him. Pt states his appetite is good. Pt rates her anxiety at 2/10 (10 is the worst anxiety) Currently, Pt denies any suicidal ideation, homicidal ideation and, visual and auditory hallucination. Pt denies any headache, nausea, vomiting, dizziness, chest pain, SOB, abdominal pain, diarrhea, and constipation. Pt denies any medication side effects and has been tolerating it well. Pt denies any concerns.  Objective: Pt is a 59 year old male with past history of MDD, CAD, DM-2, HTN, and Osteoarthritis presented voluntarily to [facility] walk in on 03/16/2020 with worsening depression and suicidal ideation. He reported that he needs different medications for his depression. He voiced his medications are not working for him. On examination, He is pleasant on approach. Pt is calm, cooperative and oriented x4. Pt's speech is normal with normal volume. Pt's mood is euthymic with congruent affect. No SI, HI or AVH. Pt is tolerating Zoloft well without any complaints. We increased the dose to 50 mg yesterday. He denies any side effects. CSW to  find a alcohol addiction program. CSW confirmed that he doesn't have any pending charges. His Vitals are stable. He is afebrile. TSH is 1.089. His Glucose is controlled at 128 today (Previous readings 120. 103), EKG- Normal sinus rhythm with Qtc- 380/385. He has been attending groups.  Vitals- Blood pressure (!) 123/92, pulse 60, temperature 97.7 F (36.5 C), temperature source Oral, resp. rate 16, SpO2 100 %.  Principal Problem: MDD (major depressive disorder), severe ([place]) Diagnosis: Principal Problem:   MDD (major depressive disorder), severe ([name]) Active Problems:   DM2 (diabetes mellitus, type 2) ([name])   CAD S/P percutaneous coronary angioplasty   Dyslipidemia, goal LDL below 70   Essential hypertension   Alcohol dependence ([place])  Total Time spent with patient: 20 minutes  Past Psychiatric History: See H&P  Past Medical History:  Past Medical History:  Diagnosis Date  . Allergies   . Arthritis   . Chest pain   . Chronic lower back pain   . Chronic pain of right wrist   . Coronary artery disease    a. Multiple prior caths/PCI. Cath 2013 with possible spasm of RCA, 70% ISR of mid LCx with subsequent DES to mLCx and prox LCX. b. H/o microvascular angina. c. Recurrent angina 08/2014 - s/p PTCA/DES to prox Cx, PTCA/CBA to OM1.  c. LHC 06/10/15 with patent stents and some ISR in LCX and OM-1 that was not flow limiting --> Rx   . Dyslipidemia    a. Intolerant to many statins except  tolerating Livalo.  Marland Kitchen GERD (gastroesophageal reflux disease)   . H/O cardiac catheterization 10/25/2018  . Heart attack ([place])   . Hypertension   . Myocardial infarction ([place]) ~ 2010  . S/P angioplasty with stent, DES, to proximal and mid LCX 12/15/11 12/15/2011  . Shoulder pain   . Stroke [facility])    pt. reports had a stroke around time of MI 2010  . Type II diabetes mellitus ([place])   . Unstable angina [facility])     Past Surgical History:  Procedure Laterality Date  . CARDIAC CATHETERIZATION  06/15/2002    LAD with prox 40% stenosis, norma L main, Cfx with 25% lesion, RCA with long mid 25% stenosis (Dr. Vita Barley)  . CARDIAC CATHETERIZATION  04/01/2010   normal L main, LAD wit mild stenosis, L Cfx with 70% in-stent restenosis, RCA with 70% in-stent restenosis, LVEF >60% (Dr. K. Mali Hilty) - cutting ballon arthrectomy to RCA & Cfx (Dr. Rockne Menghini)  . CARDIAC CATHETERIZATION  08/25/2010   preserved global LV contractility; multivessel CAD, diffuse 90-95% in-stent restenosis in prox placed Cfx stent - cutting balloon arthrectomy in Cfx with multiple dilatations 90-95% to 0% stenosis (Dr. Corky Downs)  . CARDIAC CATHETERIZATION  01/26/2011   PCI & stenting of aggresive in-stent restenosis within previously stented AV groove Cfx with 3.0x55mm Taxus DES (previous stents were Promus) (Dr. Adora Fridge)  . CARDIAC CATHETERIZATION  05/11/2011   preserved LV function, 40% mid LAD stenosis, 30-40% narrowing proximal to stented semgnet of prox Cfx, patent mid RCA stent with smooth 20% narrowing in distal RCA (Dr. Corky Downs)  . CARDIAC CATHETERIZATION  12/15/2011   PCI & stenting of proximal & mid Cfx with DES - 3.0x41mm in proximal, 3.0x55mm in mid (Dr. Adora Fridge)  . CARDIAC CATHETERIZATION N/A 06/10/2015   Procedure: Left Heart Cath and Coronary Angiography;  Surgeon: Jolaine Artist, MD;  Location: [place] CV LAB;  Service: Cardiovascular;  Laterality: N/A;  . CARDIAC CATHETERIZATION  10/25/2018  . cardiometabolic testing  02/05/9832   good exercise effort, peak VO2 79% predicted with normal VO2 HR curves (mild deconditioning)  . COLONOSCOPY  12/2012   diminutive hyperplastic sigmoid poyp so repeat routine 2024  . CORONARY BALLOON ANGIOPLASTY N/A 10/25/2018   Procedure: CORONARY BALLOON ANGIOPLASTY;  Surgeon: Jettie Booze, MD;  Location: [place] CV LAB;  Service: Cardiovascular;  Laterality: N/A;  . CORONARY BALLOON ANGIOPLASTY N/A 09/29/2019   Procedure: CORONARY BALLOON ANGIOPLASTY;  Surgeon: Nelva Bush, MD;  Location: [place] CV LAB;  Service: Cardiovascular;  Laterality: N/A;  . EXCISIONAL HEMORRHOIDECTOMY  1984  . INTRAVASCULAR ULTRASOUND/IVUS N/A 09/29/2019   Procedure: Intravascular Ultrasound/IVUS;  Surgeon: Nelva Bush, MD;  Location: [place] CV LAB;  Service: Cardiovascular;  Laterality: N/A;  . LEFT HEART CATH AND CORONARY ANGIOGRAPHY N/A 10/25/2018   Procedure: LEFT HEART CATH AND CORONARY ANGIOGRAPHY;  Surgeon: Jettie Booze, MD;  Location: [place] CV LAB;  Service: Cardiovascular;  Laterality: N/A;  . LEFT HEART CATH AND CORONARY ANGIOGRAPHY N/A 09/29/2019   Procedure: LEFT HEART CATH AND CORONARY ANGIOGRAPHY;  Surgeon: Nelva Bush, MD;  Location: [place] CV LAB;  Service: Cardiovascular;  Laterality: N/A;  . LEFT HEART CATHETERIZATION WITH CORONARY ANGIOGRAM N/A 05/11/2011   Procedure: LEFT HEART CATHETERIZATION WITH CORONARY ANGIOGRAM;  Surgeon: Troy Sine, MD;  Location: [facility] CATH LAB;  Service: Cardiovascular;  Laterality: N/A;  Possible percutaneous coronary intervention, possible IVUS  . LEFT HEART CATHETERIZATION WITH CORONARY ANGIOGRAM N/A 12/15/2011  Procedure: LEFT HEART CATHETERIZATION WITH CORONARY ANGIOGRAM;  Surgeon: Lorretta Harp, MD;  Location: [facility] CATH LAB;  Service: Cardiovascular;  Laterality: N/A;  . LEFT HEART CATHETERIZATION WITH CORONARY ANGIOGRAM N/A 09/05/2014   Procedure: LEFT HEART CATHETERIZATION WITH CORONARY ANGIOGRAM;  Surgeon: Burnell Blanks, MD;  Location: [facility] CATH LAB;  Service: Cardiovascular;  Laterality: N/A;  . LIPOMA EXCISION     back of the head  . NM MYOCAR PERF WALL MOTION  02/2012   lexiscan myoview; mild perfusion defect in mid inferolateral & basal inferolateral region (infarct/scar); EF 52%, abnormal but ow risk scan  . PERCUTANEOUS CORONARY STENT INTERVENTION (PCI-S)  09/05/2014   Procedure: PERCUTANEOUS CORONARY STENT INTERVENTION (PCI-S);  Surgeon: Burnell Blanks, MD;  Location[facility]  CATH LAB;  Service: Cardiovascular;;   Family History:  Family History  Problem Relation Age of Onset  . Leukemia Mother   . Prostate cancer Father   . Coronary artery disease Paternal Grandmother   . Cancer Paternal Grandfather   . Cancer Brother    Family Psychiatric  History: See H&P Social History:  Social History   Substance and Sexual Activity  Alcohol Use Yes   Comment: 2 bottles of wine on 12/08/2019 (one occurrence)     Social History   Substance and Sexual Activity  Drug Use Yes  . Types: Cocaine   Comment: last use several months ago    Social History   Socioeconomic History  . Marital status: Unknown    Spouse name: Not on file  . Number of children: 2  . Years of education: GED  . Highest education level: Not on file  Occupational History  . Not on file  Tobacco Use  . Smoking status: Former Smoker    Packs/day: 1.00    Years: 10.00    Pack years: 10.00    Types: Cigarettes    Quit date: 10/07/2018    Years since quitting: 1.4  . Smokeless tobacco: Never Used  Vaping Use  . Vaping Use: Never used  Substance and Sexual Activity  . Alcohol use: Yes    Comment: 2 bottles of wine on 12/08/2019 (one occurrence)  . Drug use: Yes    Types: Cocaine    Comment: last use several months ago  . Sexual activity: Not Currently  Other Topics Concern  . Not on file  Social History Narrative   ** Merged History Encounter **       Social Determinants of Health   Financial Resource Strain:   . Difficulty of Paying Living Expenses: Not on file  Food Insecurity:   . Worried About Charity fundraiser in the Last Year: Not on file  . Ran Out of Food in the Last Year: Not on file  Transportation Needs:   . Lack of Transportation (Medical): Not on file  . Lack of Transportation (Non-Medical): Not on file  Physical Activity:   . Days of Exercise per Week: Not on file  . Minutes of Exercise per Session: Not on file  Stress:   . Feeling of Stress : Not on  file  Social Connections:   . Frequency of Communication with Friends and Family: Not on file  . Frequency of Social Gatherings with Friends and Family: Not on file  . Attends Religious Services: Not on file  . Active Member of Clubs or Organizations: Not on file  . Attends Archivist Meetings: Not on file  . Marital Status: Not on file   Additional  Social History:    Pain Medications: See MAR Prescriptions: See MAR Over the Counter: See MAR History of alcohol / drug use?: Yes Longest period of sobriety (when/how long): 4 years and 1 month Name of Substance 1: Alcohol 1 - Age of First Use: 23 1 - Amount (size/oz): 22 oz beer and bottle of wine 1 - Frequency: weeks at time 1 - Duration: on going 1 - Last Use / Amount: 03/16/2020                  Sleep: Good  Appetite:  Good  Current Medications: Current Facility-Administered Medications  Medication Dose Route Frequency Provider Last Rate Last Admin  . acetaminophen (TYLENOL) tablet 650 mg  650 mg Oral Q6H PRN Elmarie Shiley A, NP   650 mg at 03/19/20 1735  . alum & mag hydroxide-simeth (MAALOX/MYLANTA) 200-200-20 MG/5ML suspension 30 mL  30 mL Oral Q4H PRN Elmarie Shiley A, NP      . amLODipine (NORVASC) tablet 5 mg  5 mg Oral Daily Sharma Covert, MD   5 mg at 03/19/20 0825  . aspirin EC tablet 81 mg  81 mg Oral Daily Sharma Covert, MD   81 mg at 03/19/20 0825  . folic acid (FOLVITE) tablet 1 mg  1 mg Oral Daily Sharma Covert, MD   1 mg at 03/19/20 0825  . hydrOXYzine (ATARAX/VISTARIL) tablet 25 mg  25 mg Oral TID PRN Niel Hummer, NP   25 mg at 03/19/20 2108  . insulin aspart (novoLOG) injection 0-15 Units  0-15 Units Subcutaneous TID WC Niel Hummer, NP   2 Units at 03/20/20 [phone]  . insulin aspart (novoLOG) injection 0-5 Units  0-5 Units Subcutaneous QHS Elmarie Shiley A, NP      . isosorbide mononitrate (IMDUR) 24 hr tablet 90 mg  90 mg Oral Daily Sharma Covert, MD   90 mg at 03/19/20 1145   . lisinopril (ZESTRIL) tablet 10 mg  10 mg Oral Daily Sharma Covert, MD   10 mg at 03/19/20 1145  . LORazepam (ATIVAN) tablet 1 mg  1 mg Oral Q6H PRN Sharma Covert, MD      . magnesium hydroxide (MILK OF MAGNESIA) suspension 30 mL  30 mL Oral Daily PRN Elmarie Shiley A, NP   30 mL at 03/19/20 1735  . metoprolol tartrate (LOPRESSOR) tablet 100 mg  100 mg Oral BID Sharma Covert, MD   100 mg at 03/19/20 1731  . nitroGLYCERIN (NITROSTAT) SL tablet 0.4 mg  0.4 mg Sublingual Q5 min PRN Sharma Covert, MD      . sertraline (ZOLOFT) tablet 50 mg  50 mg Oral Daily Ashby Moskal, MD      . thiamine tablet 100 mg  100 mg Oral Daily Sharma Covert, MD   100 mg at 03/19/20 0826  . ticagrelor (BRILINTA) tablet 90 mg  90 mg Oral BID Sharma Covert, MD   90 mg at 03/19/20 1730  . traZODone (DESYREL) tablet 50 mg  50 mg Oral QHS PRN Niel Hummer, NP   50 mg at 03/19/20 2108    Lab Results:  Results for orders placed or performed during the hospital encounter of 03/18/20 (from the past 48 hour(s))  SARS Coronavirus 2 by RT PCR (hospital order, performed in [facility] hospital lab) Nasopharyngeal Nasopharyngeal Swab     Status: None   Collection Time: 03/18/20  2:46 PM   Specimen: Nasopharyngeal Swab  Result  Value Ref Range   SARS Coronavirus 2 NEGATIVE NEGATIVE    Comment: (NOTE) SARS-CoV-2 target nucleic acids are NOT DETECTED.  The SARS-CoV-2 RNA is generally detectable in upper and lower respiratory specimens during the acute phase of infection. The lowest concentration of SARS-CoV-2 viral copies this assay can detect is 250 copies / mL. A negative result does not preclude SARS-CoV-2 infection and should not be used as the sole basis for treatment or other patient management decisions.  A negative result may occur with improper specimen collection / handling, submission of specimen other than nasopharyngeal swab, presence of viral mutation(s) within the areas targeted by  this assay, and inadequate number of viral copies (<250 copies / mL). A negative result must be combined with clinical observations, patient history, and epidemiological information.  Fact Sheet for Patients:   StrictlyIdeas.no  Fact Sheet for Healthcare Providers: BankingDealers.co.za  This test is not yet approved or  cleared by the [place] FDA and has been authorized for detection and/or diagnosis of SARS-CoV-2 by FDA under an Emergency Use Authorization (EUA).  This EUA will remain in effect (meaning this test can be used) for the duration of the COVID-19 declaration under Section 564(b)(1) of the Act, 21 U.S.C. section 360bbb-3(b)(1), unless the authorization is terminated or revoked sooner.  Performed at [facility], [place] [address], [place], [place] [address]   Glucose, capillary     Status: Abnormal   Collection Time: 03/18/20  9:15 PM  Result Value Ref Range   Glucose-Capillary 106 (H) 70 - 99 mg/dL    Comment: Glucose reference range applies only to samples taken after fasting for at least 8 hours.  Glucose, capillary     Status: Abnormal   Collection Time: 03/19/20  6:22 AM  Result Value Ref Range   Glucose-Capillary 128 (H) 70 - 99 mg/dL    Comment: Glucose reference range applies only to samples taken after fasting for at least 8 hours.   Comment 1 Notify RN    Comment 2 Document in Chart   Glucose, capillary     Status: None   Collection Time: 03/19/20 11:37 AM  Result Value Ref Range   Glucose-Capillary 92 70 - 99 mg/dL    Comment: Glucose reference range applies only to samples taken after fasting for at least 8 hours.   Comment 1 Notify RN    Comment 2 Document in Chart   Glucose, capillary     Status: Abnormal   Collection Time: 03/19/20  5:21 PM  Result Value Ref Range   Glucose-Capillary 103 (H) 70 - 99 mg/dL    Comment: Glucose reference range applies only to samples taken after  fasting for at least 8 hours.   Comment 1 Notify RN    Comment 2 Document in Chart   TSH     Status: None   Collection Time: 03/19/20  6:12 PM  Result Value Ref Range   TSH 1.089 0.350 - 4.500 uIU/mL    Comment: Performed by a 3rd Generation assay with a functional sensitivity of <=0.01 uIU/mL. Performed at [facility], [place] [address], [place],  [address]   Glucose, capillary     Status: Abnormal   Collection Time: 03/19/20  8:45 PM  Result Value Ref Range   Glucose-Capillary 120 (H) 70 - 99 mg/dL    Comment: Glucose reference range applies only to samples taken after fasting for at least 8 hours.  Glucose, capillary     Status: Abnormal  Collection Time: 03/20/20  5:57 AM  Result Value Ref Range   Glucose-Capillary 128 (H) 70 - 99 mg/dL    Comment: Glucose reference range applies only to samples taken after fasting for at least 8 hours.   Comment 1 Notify RN    Comment 2 Document in Chart     Blood Alcohol level:  Lab Results  Component Value Date   ETH 18 (H) 03/17/2020   ETH <10 17/61/6073    Metabolic Disorder Labs: Lab Results  Component Value Date   HGBA1C 6.4 (H) 03/16/2020   MPG 136.98 03/16/2020   MPG 151.33 12/12/2019   Lab Results  Component Value Date   PROLACTIN 13.0 03/17/2020   Lab Results  Component Value Date   CHOL 168 03/17/2020   TRIG 237 (H) 03/17/2020   HDL 45 03/17/2020   CHOLHDL 3.7 03/17/2020   VLDL 47 (H) 03/17/2020   LDLCALC 76 03/17/2020   LDLCALC 72 12/10/2019    Physical Findings: AIMS:  , ,  ,  ,    CIWA:    COWS:     Musculoskeletal: Strength & Muscle Tone: within normal limits Gait & Station: normal Patient leans: N/A  Psychiatric Specialty Exam: Physical Exam Vitals and nursing note reviewed.  Constitutional:      General: He is not in acute distress.    Appearance: Normal appearance. He is not toxic-appearing or diaphoretic.  HENT:     Head: Normocephalic and atraumatic.  Pulmonary:      Effort: Pulmonary effort is normal.  Neurological:     General: No focal deficit present.     Mental Status: He is alert and oriented to person, place, and time.     Review of Systems  Constitutional: Negative for activity change, appetite change, fatigue and fever.  Respiratory: Negative.  Negative for chest tightness and shortness of breath.   Cardiovascular: Negative.  Negative for chest pain and palpitations.  Gastrointestinal: Negative.  Negative for abdominal pain, constipation, diarrhea and vomiting.  Neurological: Negative.  Negative for dizziness, light-headedness, numbness and headaches.    Blood pressure (!) 123/92, pulse 60, temperature 97.7 F (36.5 C), temperature source Oral, resp. rate 16, SpO2 100 %.There is no height or weight on file to calculate BMI.  General Appearance: Casual  Eye Contact:  Good  Speech:  Normal Rate  Volume:  Normal  Mood:  Euthymic  Affect:  Congruent  Thought Process:  Coherent and Descriptions of Associations: Intact  Orientation:  Full (Time, Place, and Person)  Thought Content:  Logical  Suicidal Thoughts:  No  Homicidal Thoughts:  No  Memory:  Immediate;   Fair Recent;   Fair Remote;   Fair  Judgement:  Intact  Insight:  Lacking  Psychomotor Activity:  Normal  Concentration:  Concentration: Good and Attention Span: Good  Recall:  Good  Fund of Knowledge:  Good  Language:  Good  Akathisia:  Negative  Handed:  Right  AIMS (if indicated):     Assets:  Desire for Improvement Resilience  ADL's:  Intact  Cognition:  WNL  Sleep:  Number of Hours: 5.5     Treatment Plan Summary:Pt is a 59 year old male with past history of MDD, CAD, DM-2, HTN, and Osteoarthritis presented voluntarily to [facility] walk in on 03/16/2020 with worsening depression and suicidal ideation. He reported that he needs different medications for his depression. He voiced his medications are not working for him. On examination, He is pleasant on approach. Pt is  calm, cooperative and oriented x4. Pt's speech is normal with normal volume. Pt's mood is euthymic with congruent affect. No SI, HI or AVH. Pt is tolerating Zoloft well without any complaints. We increased the dose to 50 mg yesterday. His Vitals are stable. He is afebrile. TSH is 1.089. He has been attending groups.CSW confirmed that he doesn't have any pending charges.Vitals- Blood pressure (!) 123/92, pulse 60, temperature 97.7 F (36.5 C), temperature source Oral, resp. rate 16, SpO2 100 %. His Glucose is controlled at 128 today (Previous readings 120. 103) EKG- Normal sinus rhythm with Qtc- 380/385  Plan- Daily contact with patient to assess and evaluate symptoms and progress in treatment - CSW to find a alcohol addiction program. -Monitor Vitals. -Monitor for Suicidal Ideation. -Monitor for withdrawal symptoms. -Monitor for medication side effects. -Continue Zoloft to 50 mg Daily. -Continue Lisinopril 10mg  Daily -Continue Lopressor  100mg  BID -Continue Nitrostat 0.4 mg sublingual every 5 minutes PRN for Chest pain  -Continue Ativan 1 mg Q6H PRN for withdrawal symptoms if CIWA >10 -ContinueThiamin tablet 100 mg Daily from tomorrow - Continue Folic acid 1 mg Oral Daily -Continue Brilinta 90 mg BID -Continue Amlodipine 5 mg Daily -Continue Aspirin EC 81 mg Daily -Continue Insulin Novolog inj 0-15 units according to sliding scale 3 times daily with meals.  -Continue  Insulin Novolog inj 0-15 units according to sliding scale at bed time. -Continue Imdur 24 hr tablet 90 mg oral daily -Continue Milk of Mg 30 mg PRN daily for constipation  -Continue Trazodone 50 mg QHS PRN for sleep. -Patient will participate in the therapeutic group milieu.  Armando Reichert, MD 03/20/2020, 7:48 AM

## 2020-03-21 DIAGNOSIS — I10 Essential (primary) hypertension: Secondary | ICD-10-CM

## 2020-03-21 DIAGNOSIS — F1020 Alcohol dependence, uncomplicated: Secondary | ICD-10-CM

## 2020-03-21 DIAGNOSIS — I2510 Atherosclerotic heart disease of native coronary artery without angina pectoris: Secondary | ICD-10-CM

## 2020-03-21 DIAGNOSIS — Z9861 Coronary angioplasty status: Secondary | ICD-10-CM

## 2020-03-21 DIAGNOSIS — F1024 Alcohol dependence with alcohol-induced mood disorder: Secondary | ICD-10-CM

## 2020-03-21 LAB — GLUCOSE, CAPILLARY: Glucose-Capillary: 133 mg/dL — ABNORMAL HIGH (ref 70–99)

## 2020-03-21 MED ORDER — METOPROLOL TARTRATE 100 MG PO TABS
100.0000 mg | ORAL_TABLET | Freq: Two times a day (BID) | ORAL | 0 refills | Status: DC
Start: 2020-03-21 — End: 2020-05-07

## 2020-03-21 MED ORDER — LISINOPRIL 10 MG PO TABS
10.0000 mg | ORAL_TABLET | Freq: Every day | ORAL | 0 refills | Status: DC
Start: 2020-03-22 — End: 2020-05-07

## 2020-03-21 MED ORDER — ASPIRIN 81 MG PO TBEC: 81.0000 mg | DELAYED_RELEASE_TABLET | Freq: Every day | ORAL | 0 refills | Status: DC

## 2020-03-21 MED ORDER — TRAZODONE HCL 50 MG PO TABS
50.0000 mg | ORAL_TABLET | Freq: Every evening | ORAL | 0 refills | Status: DC | PRN
Start: 2020-03-21 — End: 2020-05-07

## 2020-03-21 MED ORDER — ISOSORBIDE MONONITRATE ER 30 MG PO TB24
90.0000 mg | ORAL_TABLET | Freq: Every day | ORAL | 0 refills | Status: DC
Start: 2020-03-22 — End: 2020-05-07

## 2020-03-21 MED ORDER — TICAGRELOR 90 MG PO TABS: 90.0000 mg | ORAL_TABLET | Freq: Two times a day (BID) | ORAL | 0 refills | Status: DC

## 2020-03-21 MED ORDER — BENZOCAINE 10 % MT GEL: Freq: Four times a day (QID) | OROMUCOSAL | 0 refills | Status: DC | PRN

## 2020-03-21 MED ORDER — SERTRALINE HCL 50 MG PO TABS
50.0000 mg | ORAL_TABLET | Freq: Every day | ORAL | 0 refills | Status: DC
Start: 2020-03-22 — End: 2020-05-07

## 2020-03-21 MED ORDER — HYDROXYZINE HCL 25 MG PO TABS
25.0000 mg | ORAL_TABLET | Freq: Three times a day (TID) | ORAL | 0 refills | Status: DC | PRN
Start: 2020-03-21 — End: 2020-05-07

## 2020-03-21 MED ORDER — BENZOCAINE 10 % MT GEL
Freq: Four times a day (QID) | OROMUCOSAL | Status: DC | PRN
  Administered 2020-03-21: 1 via OROMUCOSAL
  Filled 2020-03-21: qty 9

## 2020-03-21 MED ORDER — AMLODIPINE BESYLATE 5 MG PO TABS
5.0000 mg | ORAL_TABLET | Freq: Every day | ORAL | 0 refills | Status: DC
Start: 2020-03-22 — End: 2020-05-07

## 2020-03-21 MED ORDER — FOLIC ACID 1 MG PO TABS
1.0000 mg | ORAL_TABLET | Freq: Every day | ORAL | 0 refills | Status: DC
Start: 2020-03-22 — End: 2020-05-07

## 2020-03-21 MED ORDER — THIAMINE HCL 100 MG PO TABS
100.0000 mg | ORAL_TABLET | Freq: Every day | ORAL | 0 refills | Status: DC
Start: 2020-03-22 — End: 2020-05-07
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
## 2020-03-21 NOTE — Progress Notes (Signed)
D:  Patient's self inventory sheet, patient sleeps good, no sleep medication.  Good appetite, high energy level, good  concentration.  Denied depression and hopeless, anxiety 1.  Denied withdrawals.  Denied SI.  Physical problems, toothache, worst pain #4 in past 24 hours, low back pain.  Pain medicine helpful.  Goal is to do what MD/staff tell me to do.  Does have discharge plans.  Plans to stay open minded. A:  Medications administered per MD orders.  Emotional support and encouragement given patient. R:  Denied SI and HI, contracts for safety.  Denied A/V hallucinations.  Safety maintained with 15 minute checks.

## 2020-03-21 NOTE — Progress Notes (Signed)
°  [facility] Adult Case Management Discharge Plan :  Will you be returning to the same living situation after discharge:  Yes,  to home At discharge, do you have transportation home?: No. Safe Transport will be arranged Do you have the ability to pay for your medications: Yes,  has insurance  Release of information consent forms completed and in the chart;  Patient's signature needed at discharge.  Patient to Follow up at:  Follow-up Information    [facility]. Go on 03/25/2020.   Why: Therapy appointment has been made for Monday, 03/25/20 at 11am. This visit will be in office Contact information: [address]  [place] [address] [phone]        [facility] Follow up.   Why: A referral has been made on your behalf. Please call daily to check bed availability.  Contact information: [address] [place] [address] [phone]        [facility] Call.   Specialty: Addiction Medicine Why: A referral has been made on your behalf. Please call daily to check bed availability.  Contact information: [place] [address] [phone]               Next level of care provider has access to [place] and Suicide Prevention discussed: Yes,  with patient  Have you used any form of tobacco in the last 30 days? (Cigarettes, Smokeless Tobacco, Cigars, and/or Pipes): No  Has patient been referred to the Quitline?: Patient refused referral  Patient has been referred for addiction treatment: Yes  Vassie Moselle, LCSW 03/21/2020, 11:06 AM

## 2020-03-21 NOTE — Progress Notes (Signed)
Discharge Note:  Patient discharged by Safe Transport to his home.  Patient denied SI and HI.  Denied A/V hallucinations.  Suicide prevention information given and discussed with patient who stated he understood and had no questions.  Patient stated he appreciated all assistance received from [facility] staff.  All required discharge information given to patient at discharge.

## 2020-03-21 NOTE — Plan of Care (Signed)
Nurse discussed anxiety, depression and coping skills with patient.  

## 2020-03-21 NOTE — BHH Suicide Risk Assessment (Signed)
[facility] Discharge Suicide Risk Assessment   Principal Problem: MDD (major depressive disorder), severe ([place]) Discharge Diagnoses: Principal Problem:   MDD (major depressive disorder), severe ([place]) Active Problems:   DM2 (diabetes mellitus, type 2) ([place])   CAD S/P percutaneous coronary angioplasty   Dyslipidemia, goal LDL below 70   Essential hypertension   Alcohol dependence ([place])   Total Time spent with patient: 15 minutes  Musculoskeletal: Strength & Muscle Tone: within normal limits Gait & Station: normal Patient leans: N/A  Psychiatric Specialty Exam: Review of Systems  All other systems reviewed and are negative.   Blood pressure (!) 139/99, pulse 60, temperature 97.7 F (36.5 C), temperature source Oral, resp. rate 16, SpO2 100 %.There is no height or weight on file to calculate BMI.  General Appearance: Casual  Eye Contact::  Good  Speech:  Normal Rate409  Volume:  Normal  Mood:  Euthymic  Affect:  Congruent  Thought Process:  Coherent and Descriptions of Associations: Intact  Orientation:  Full (Time, Place, and Person)  Thought Content:  Logical  Suicidal Thoughts:  No  Homicidal Thoughts:  No  Memory:  Immediate;   Good Recent;   Good Remote;   Good  Judgement:  Intact  Insight:  Good  Psychomotor Activity:  Normal  Concentration:  Good  Recall:  Good  Fund of Knowledge:Good  Language: Good  Akathisia:  Negative  Handed:  Right  AIMS (if indicated):     Assets:  Desire for Improvement Housing Resilience  Sleep:  Number of Hours: 6.5  Cognition: WNL  ADL's:  Intact   Mental Status Per Nursing Assessment::   On Admission:  NA  Demographic Factors:  Male, Low socioeconomic status, Living alone and Unemployed  Loss Factors: NA  Historical Factors: Impulsivity  Risk Reduction Factors:   Positive coping skills or problem solving skills  Continued Clinical Symptoms:  Depression:   Comorbid alcohol  abuse/dependence Impulsivity Alcohol/Substance Abuse/Dependencies  Cognitive Features That Contribute To Risk:  None    Suicide Risk:  Minimal: No identifiable suicidal ideation.  Patients presenting with no risk factors but with morbid ruminations; may be classified as minimal risk based on the severity of the depressive symptoms   Follow-up Information    [facility]. Go on 03/25/2020.   Why: Therapy appointment has been made for Monday, 03/25/20 at 11am. This visit will be in office Contact information: [address]  [place] [address] [phone]        [facility] Follow up.   Why: A referral has been made on your behalf. Please call daily to check bed availability.  Contact information: [address] [place] [address] [phone]        [facility] Call.   Specialty: Addiction Medicine Why: A referral has been made on your behalf. Please call daily to check bed availability.  Contact information: [place] [address] [phone]               Plan Of Care/Follow-up recommendations:  Activity:  ad lib  Sharma Covert, MD 03/21/2020, 10:13 AM

## 2020-03-22 IMAGING — CR CHEST - 2 VIEW
2 series · 2 of 2 positions shown · non-contrast
Comparison: 10/25/2018

CLINICAL DATA: Chest pain

EXAM:
CHEST - 2 VIEW

[chest pa]
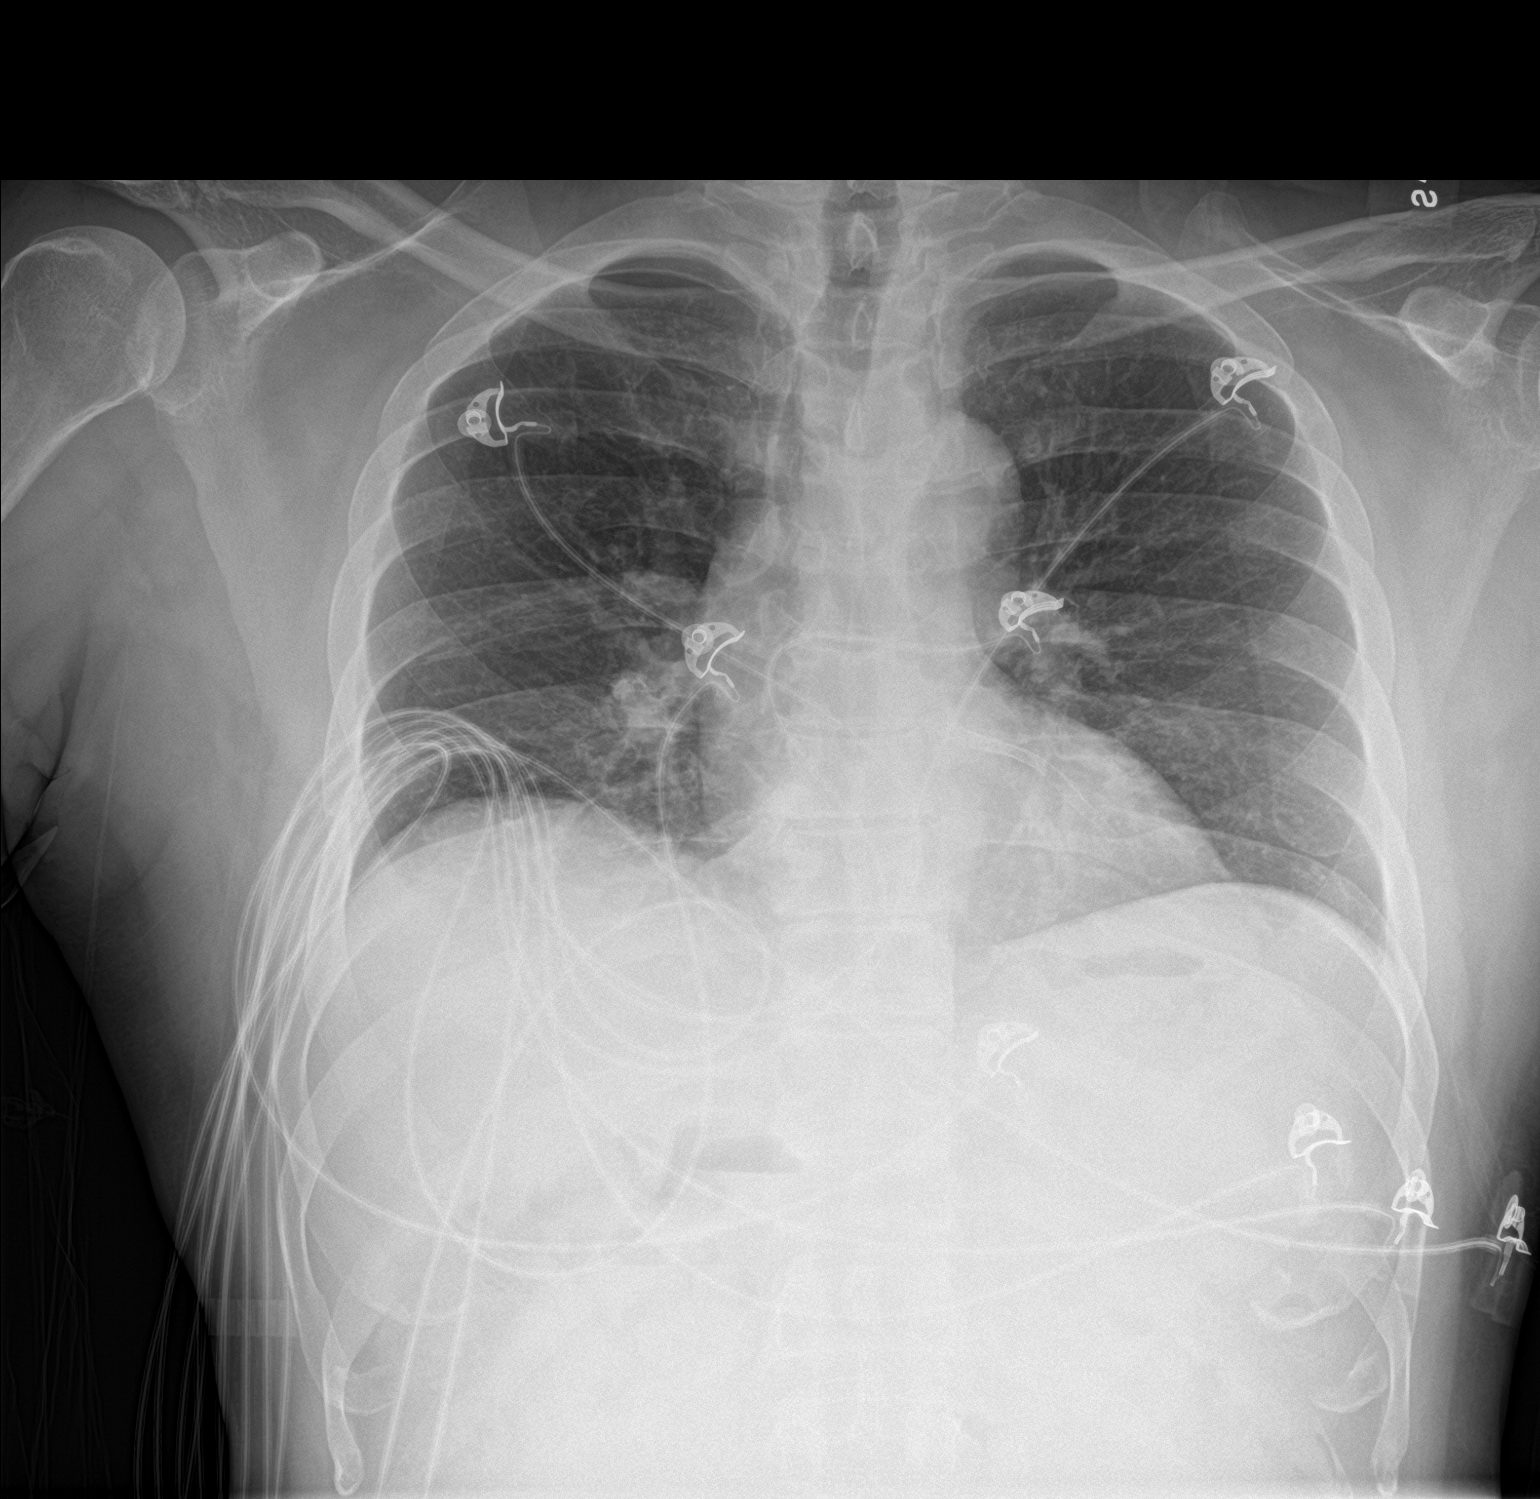

[chest lat]
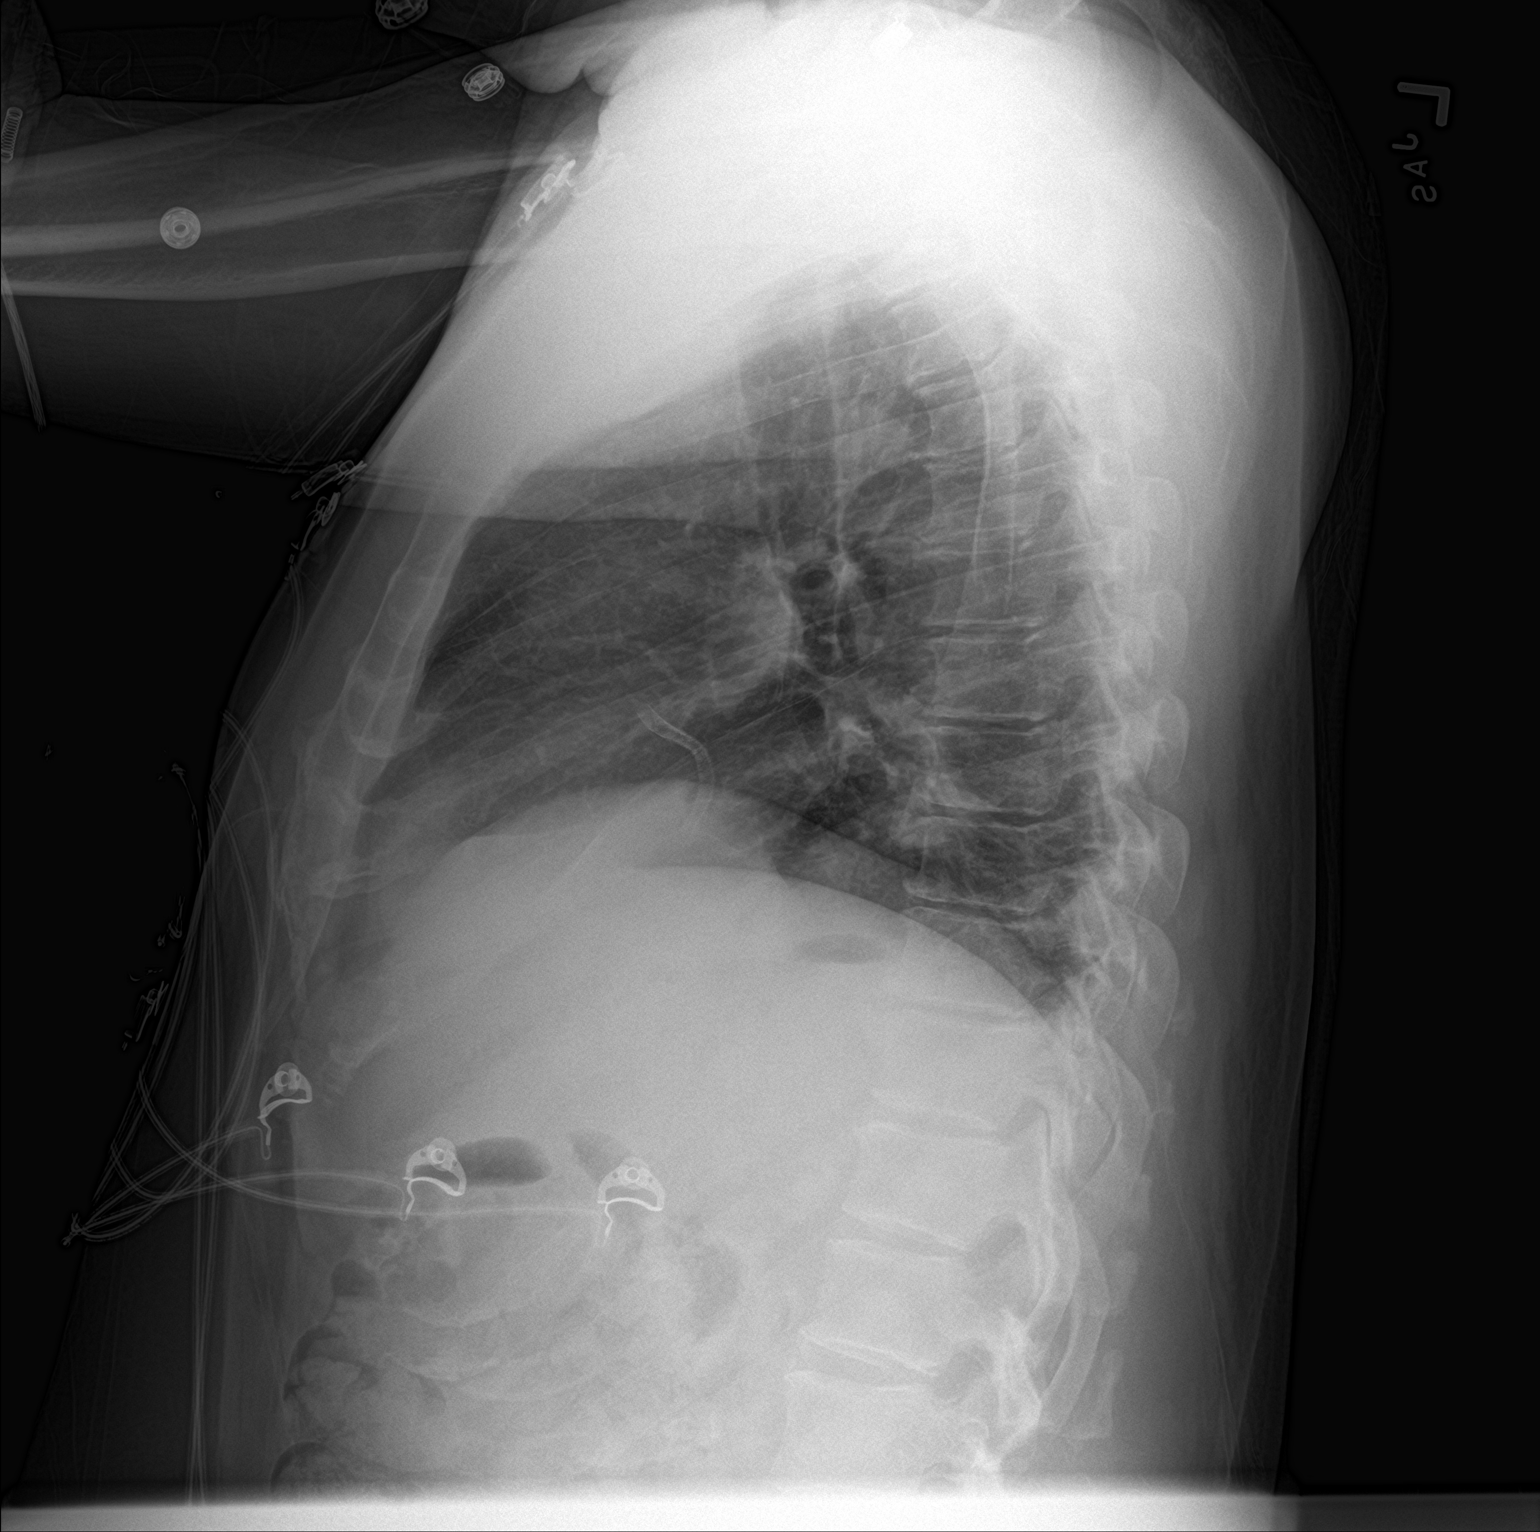

[2 of 2 positions shown; findings below may reference images not displayed]

FINDINGS: Heart and mediastinal contours are within normal limits. No focal
opacities or effusions. No acute bony abnormality. Dense coronary
artery calcifications.
IMPRESSION: No active cardiopulmonary disease.

## 2020-03-24 ENCOUNTER — Encounter (HOSPITAL_COMMUNITY): Payer: Self-pay | Admitting: Emergency Medicine

## 2020-03-24 ENCOUNTER — Emergency Department (HOSPITAL_COMMUNITY): Payer: Medicaid Other

## 2020-03-24 ENCOUNTER — Emergency Department (HOSPITAL_COMMUNITY)
Admission: EM | Admit: 2020-03-24 | Discharge: 2020-03-24 | Disposition: A | Discharge status: Home / Self Care | Payer: Medicaid Other | Attending: Emergency Medicine | Admitting: Emergency Medicine

## 2020-03-24 ENCOUNTER — Other Ambulatory Visit: Payer: Self-pay

## 2020-03-24 DIAGNOSIS — I10 Essential (primary) hypertension: Secondary | ICD-10-CM | POA: Insufficient documentation

## 2020-03-24 DIAGNOSIS — Z7982 Long term (current) use of aspirin: Secondary | ICD-10-CM | POA: Insufficient documentation

## 2020-03-24 DIAGNOSIS — Z20822 Contact with and (suspected) exposure to covid-19: Secondary | ICD-10-CM | POA: Diagnosis not present

## 2020-03-24 DIAGNOSIS — Z9861 Coronary angioplasty status: Secondary | ICD-10-CM | POA: Diagnosis not present

## 2020-03-24 DIAGNOSIS — Z7984 Long term (current) use of oral hypoglycemic drugs: Secondary | ICD-10-CM | POA: Insufficient documentation

## 2020-03-24 DIAGNOSIS — E119 Type 2 diabetes mellitus without complications: Secondary | ICD-10-CM | POA: Diagnosis not present

## 2020-03-24 DIAGNOSIS — R0789 Other chest pain: Secondary | ICD-10-CM

## 2020-03-24 DIAGNOSIS — Z79899 Other long term (current) drug therapy: Secondary | ICD-10-CM | POA: Diagnosis not present

## 2020-03-24 DIAGNOSIS — Z87891 Personal history of nicotine dependence: Secondary | ICD-10-CM | POA: Diagnosis not present

## 2020-03-24 DIAGNOSIS — I2510 Atherosclerotic heart disease of native coronary artery without angina pectoris: Secondary | ICD-10-CM | POA: Insufficient documentation

## 2020-03-24 LAB — BASIC METABOLIC PANEL
Anion gap: 12 (ref 5–15)
BUN: 15 mg/dL (ref 6–20)
CO2: 22 mmol/L (ref 22–32)
Calcium: 9.7 mg/dL (ref 8.9–10.3)
Chloride: 102 mmol/L (ref 98–111)
Creatinine, Ser: 1 mg/dL (ref 0.61–1.24)
GFR calc Af Amer: >60 mL/min (ref >60)
GFR calc non Af Amer: >60 mL/min (ref >60)
Glucose, Bld: 165 mg/dL — ABNORMAL HIGH (ref 70–99)
Potassium: 4.1 mmol/L (ref 3.5–5.1)
Sodium: 136 mmol/L (ref 135–145)

## 2020-03-24 LAB — RESPIRATORY PANEL BY RT PCR (FLU A&B, COVID)
Influenza A by PCR: NEGATIVE
Influenza B by PCR: NEGATIVE
SARS Coronavirus 2 by RT PCR: NEGATIVE

## 2020-03-24 LAB — CBC
HCT: 46.6 % (ref 39.0–52.0)
Hemoglobin: 15.1 g/dL (ref 13.0–17.0)
MCH: 28.2 pg (ref 26.0–34.0)
MCHC: 32.4 g/dL (ref 30.0–36.0)
MCV: 87.1 fL (ref 80.0–100.0)
Platelets: 267 10*3/uL (ref 150–400)
RBC: 5.35 MIL/uL (ref 4.22–5.81)
RDW: 12.9 % (ref 11.5–15.5)
WBC: 6.4 10*3/uL (ref 4.0–10.5)
nRBC: 0 % (ref 0.0–0.2)

## 2020-03-24 LAB — TROPONIN I (HIGH SENSITIVITY)
Troponin I (High Sensitivity): 7 ng/L (ref <18)
Troponin I (High Sensitivity): 6 ng/L (ref <18)

## 2020-03-24 MED ORDER — RANOLAZINE ER 500 MG PO TB12: 500.0000 mg | ORAL_TABLET | Freq: Two times a day (BID) | ORAL | 0 refills | Status: DC

## 2020-03-24 NOTE — ED Triage Notes (Signed)
C/o intermittent R sided chest pain/"discomfort" x 1 week.  Denies SOB, nausea, vomiting, or any other associated symptoms.

## 2020-03-24 NOTE — ED Provider Notes (Signed)
[place] EMERGENCY DEPARTMENT Provider Note   CSN: [id number] Arrival date & time: 03/24/20  0736     History Chief Complaint  Patient presents with  . Chest Pain    [name] is a 59 y.o. male.  59 year old male with past medical history of MI, CAD, stent placement, CVA, diabetes, hypertension and hyperlipidemia, former smoker presents with complaint of right-sided chest discomfort.  Patient reports a dull discomfort on the right side of his chest that does not radiate and has been intermittent for the past week and a half occurring every other day or so.  Pain occurs sometimes with exertion but also occurs at rest.  Denies associated shortness of breath, nausea, diaphoresis, neck or shoulder discomfort, leg swelling.  Patient states pain is similar to when he was admitted to the hospital in April of this year and found to have an 80% blockage which was treated with "a balloon."  Patient is managed by Dr. Debara Pickett with cardiology.  Patient presents today because this discomfort is not going away, no change in symptoms has been experiencing for the past week and a half.        Past Medical History:  Diagnosis Date  . Allergies   . Arthritis   . Chest pain   . Chronic lower back pain   . Chronic pain of right wrist   . Coronary artery disease    a. Multiple prior caths/PCI. Cath 2013 with possible spasm of RCA, 70% ISR of mid LCx with subsequent DES to mLCx and prox LCX. b. H/o microvascular angina. c. Recurrent angina 08/2014 - s/p PTCA/DES to prox Cx, PTCA/CBA to OM1.  c. LHC 06/10/15 with patent stents and some ISR in LCX and OM-1 that was not flow limiting --> Rx   . Dyslipidemia    a. Intolerant to many statins except tolerating Livalo.  Marland Kitchen GERD (gastroesophageal reflux disease)   . H/O cardiac catheterization 10/25/2018  . Heart attack ([place])   . Hypertension   . Myocardial infarction ([place]) ~ 2010  . S/P angioplasty with stent, DES, to proximal and mid  LCX 12/15/11 12/15/2011  . Shoulder pain   . Stroke [facility])    pt. reports had a stroke around time of MI 2010  . Type II diabetes mellitus ([place])   . Unstable angina [facility])     Patient Active Problem List   Diagnosis Date Noted  . Alcohol dependence ([place]) 03/19/2020  . MDD (major depressive disorder), recurrent episode, severe ([place]) 03/18/2020  . History of substance abuse ([place]) 01/04/2020  . Severe 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 features ([place]) 12/10/2019  . Tinea cruris 07/06/2019  . Hemoglobin A1c less than 7.0% 07/06/2019  . Muscle strain of wrist 04/05/2019  . Pain in both wrists 04/05/2019  . Anxiety 04/05/2019  . Prediabetes 04/05/2019  . MDD (major depressive disorder) 01/12/2019  . Chest pain 10/25/2018  . MDD (major depressive disorder), recurrent severe, without psychosis ([place]) 08/15/2018  . Influenza A 07/17/2017  . Lactic acidosis   . Cough   . Nausea and vomiting   . Severe episode of recurrent major depressive disorder, without psychotic features ([place])   . MDD (major depressive disorder), single episode, severe with psychotic features ([place]) 06/27/2017  . MDD (major depressive disorder), severe ([place]) 06/23/2017  . Essential hypertension 01/13/2016  . Unstable angina ([place])   . ED (erectile dysfunction) 02/15/2014  . Atypical chest pain 12/14/2011  . Allergy to contrast media (used for  diagnostic x-rays) 05/12/2011  . Dyslipidemia, goal LDL below 70 05/09/2011  . DM2 (diabetes mellitus, type 2) ([place]) 07/10/2010  . CAD S/P percutaneous coronary angioplasty 07/10/2010    Past Surgical History:  Procedure Laterality Date  . CARDIAC CATHETERIZATION  06/15/2002   LAD with prox 40% stenosis, norma L main, Cfx with 25% lesion, RCA with long mid 25% stenosis (Dr. Vita Barley)  . CARDIAC CATHETERIZATION  04/01/2010   normal L main, LAD wit mild stenosis, L Cfx with 70% in-stent restenosis, RCA with 70% in-stent restenosis, LVEF >60% (Dr. K. Mali Hilty) - cutting  ballon arthrectomy to RCA & Cfx (Dr. Rockne Menghini)  . CARDIAC CATHETERIZATION  08/25/2010   preserved global LV contractility; multivessel CAD, diffuse 90-95% in-stent restenosis in prox placed Cfx stent - cutting balloon arthrectomy in Cfx with multiple dilatations 90-95% to 0% stenosis (Dr. Corky Downs)  . CARDIAC CATHETERIZATION  01/26/2011   PCI & stenting of aggresive in-stent restenosis within previously stented AV groove Cfx with 3.0x32mm Taxus DES (previous stents were Promus) (Dr. Adora Fridge)  . CARDIAC CATHETERIZATION  05/11/2011   preserved LV function, 40% mid LAD stenosis, 30-40% narrowing proximal to stented semgnet of prox Cfx, patent mid RCA stent with smooth 20% narrowing in distal RCA (Dr. Corky Downs)  . CARDIAC CATHETERIZATION  12/15/2011   PCI & stenting of proximal & mid Cfx with DES - 3.0x52mm in proximal, 3.0x4mm in mid (Dr. Adora Fridge)  . CARDIAC CATHETERIZATION N/A 06/10/2015   Procedure: Left Heart Cath and Coronary Angiography;  Surgeon: Jolaine Artist, MD;  Location: [place] CV LAB;  Service: Cardiovascular;  Laterality: N/A;  . CARDIAC CATHETERIZATION  10/25/2018  . cardiometabolic testing  1/54/0086   good exercise effort, peak VO2 79% predicted with normal VO2 HR curves (mild deconditioning)  . COLONOSCOPY  12/2012   diminutive hyperplastic sigmoid poyp so repeat routine 2024  . CORONARY BALLOON ANGIOPLASTY N/A 10/25/2018   Procedure: CORONARY BALLOON ANGIOPLASTY;  Surgeon: Jettie Booze, MD;  Location: [place] CV LAB;  Service: Cardiovascular;  Laterality: N/A;  . CORONARY BALLOON ANGIOPLASTY N/A 09/29/2019   Procedure: CORONARY BALLOON ANGIOPLASTY;  Surgeon: Nelva Bush, MD;  Location: [place] CV LAB;  Service: Cardiovascular;  Laterality: N/A;  . EXCISIONAL HEMORRHOIDECTOMY  1984  . INTRAVASCULAR ULTRASOUND/IVUS N/A 09/29/2019   Procedure: Intravascular Ultrasound/IVUS;  Surgeon: Nelva Bush, MD;  Location: [place] CV LAB;  Service:  Cardiovascular;  Laterality: N/A;  . LEFT HEART CATH AND CORONARY ANGIOGRAPHY N/A 10/25/2018   Procedure: LEFT HEART CATH AND CORONARY ANGIOGRAPHY;  Surgeon: Jettie Booze, MD;  Location: [place] CV LAB;  Service: Cardiovascular;  Laterality: N/A;  . LEFT HEART CATH AND CORONARY ANGIOGRAPHY N/A 09/29/2019   Procedure: LEFT HEART CATH AND CORONARY ANGIOGRAPHY;  Surgeon: Nelva Bush, MD;  Location: [place] CV LAB;  Service: Cardiovascular;  Laterality: N/A;  . LEFT HEART CATHETERIZATION WITH CORONARY ANGIOGRAM N/A 05/11/2011   Procedure: LEFT HEART CATHETERIZATION WITH CORONARY ANGIOGRAM;  Surgeon: Troy Sine, MD;  Location: [facility] CATH LAB;  Service: Cardiovascular;  Laterality: N/A;  Possible percutaneous coronary intervention, possible IVUS  . LEFT HEART CATHETERIZATION WITH CORONARY ANGIOGRAM N/A 12/15/2011   Procedure: LEFT HEART CATHETERIZATION WITH CORONARY ANGIOGRAM;  Surgeon: Lorretta Harp, MD;  Location[facility] CATH LAB;  Service: Cardiovascular;  Laterality: N/A;  . LEFT HEART CATHETERIZATION WITH CORONARY ANGIOGRAM N/A 09/05/2014   Procedure: LEFT HEART CATHETERIZATION WITH CORONARY ANGIOGRAM;  Surgeon: Burnell Blanks, MD;  Location: [facility]  CATH LAB;  Service: Cardiovascular;  Laterality: N/A;  . LIPOMA EXCISION     back of the head  . NM MYOCAR PERF WALL MOTION  02/2012   lexiscan myoview; mild perfusion defect in mid inferolateral & basal inferolateral region (infarct/scar); EF 52%, abnormal but ow risk scan  . PERCUTANEOUS CORONARY STENT INTERVENTION (PCI-S)  09/05/2014   Procedure: PERCUTANEOUS CORONARY STENT INTERVENTION (PCI-S);  Surgeon: Burnell Blanks, MD;  Location: [facility] CATH LAB;  Service: Cardiovascular;;       Family History  Problem Relation Age of Onset  . Leukemia Mother   . Prostate cancer Father   . Coronary artery disease Paternal Grandmother   . Cancer Paternal Grandfather   . Cancer Brother     Social History   Tobacco Use  . Smoking  status: Former Smoker    Packs/day: 1.00    Years: 10.00    Pack years: 10.00    Types: Cigarettes    Quit date: 10/07/2018    Years since quitting: 1.4  . Smokeless tobacco: Never Used  Vaping Use  . Vaping Use: Never used  Substance Use Topics  . Alcohol use: Yes    Comment: 2 bottles of wine on 12/08/2019 (one occurrence)  . Drug use: Yes    Types: Cocaine    Comment: last use several months ago    Home Medications Prior to Admission medications   Medication Sig Start Date End Date Taking? Authorizing Provider  amLODipine (NORVASC) 5 MG tablet Take 1 tablet (5 mg total) by mouth daily. For high blood pressure 03/22/20   Lindell Spar I, NP  aspirin EC 81 MG EC tablet Take 1 tablet (81 mg total) by mouth daily. (May buy from over the counter): Swallow whole for heart health 03/22/20   Lindell Spar I, NP  benzocaine (ORAJEL) 10 % mucosal gel Use as directed in the mouth or throat 4 (four) times daily as needed for mouth pain (toothache). 03/21/20   Lindell Spar I, NP  folic acid (FOLVITE) 1 MG tablet Take 1 tablet (1 mg total) by mouth daily. (May buy from over the counter): For folate replacement 03/22/20   Lindell Spar I, NP  hydrOXYzine (ATARAX/VISTARIL) 25 MG tablet Take 1 tablet (25 mg total) by mouth 3 (three) times daily as needed for anxiety. 03/21/20   Lindell Spar I, NP  isosorbide mononitrate (IMDUR) 30 MG 24 hr tablet Take 3 tablets (90 mg total) by mouth daily. For chest pain 03/22/20   Lindell Spar I, NP  lisinopril (ZESTRIL) 10 MG tablet Take 1 tablet (10 mg total) by mouth daily. For high blood pressure 03/22/20   Nwoko, Herbert Pun I, NP  metFORMIN (GLUCOPHAGE) 500 MG tablet TAKE 1 TABLET BY MOUTH TWICE DAILY WITH A MEAL 10/04/19   Azzie Glatter, FNP  metoprolol tartrate (LOPRESSOR) 100 MG tablet Take 1 tablet (100 mg total) by mouth 2 (two) times daily. 03/21/20   Lindell Spar I, NP  nitroGLYCERIN (NITROSTAT) 0.4 MG SL tablet Place 1 tablet (0.4 mg total) under the tongue every 5  (five) minutes x 3 doses as needed for chest pain. 09/30/19   Leanor Kail, PA  ranolazine (RANEXA) 500 MG 12 hr tablet Take 1 tablet (500 mg total) by mouth 2 (two) times daily. 03/24/20 04/23/20  Tacy Learn, PA-C  sertraline (ZOLOFT) 50 MG tablet Take 1 tablet (50 mg total) by mouth daily. For depression 03/22/20   Lindell Spar I, NP  thiamine 100 MG tablet Take 1  tablet (100 mg total) by mouth daily. (May buy from over the counter): For thiamine replacement 03/22/20   Lindell Spar I, NP  ticagrelor (BRILINTA) 90 MG TABS tablet Take 1 tablet (90 mg total) by mouth 2 (two) times daily. For cardiac issues 03/21/20   Lindell Spar I, NP  traZODone (DESYREL) 50 MG tablet Take 1 tablet (50 mg total) by mouth at bedtime as needed for sleep. 03/21/20   Lindell Spar I, NP    Allergies    Bee venom, Shellfish allergy, Statins, Statins, and Testosterone cypionate  Review of Systems   Review of Systems  Constitutional: Negative for chills, diaphoresis and fever.  Respiratory: Negative for chest tightness and shortness of breath.   Cardiovascular: Positive for chest pain. Negative for leg swelling.  Gastrointestinal: Negative for abdominal pain, nausea and vomiting.  Musculoskeletal: Negative for arthralgias, back pain and myalgias.  Skin: Negative for rash.  Allergic/Immunologic: Positive for immunocompromised state.  Neurological: Negative for weakness.  All other systems reviewed and are negative.   Physical Exam Updated Vital Signs BP (!) 142/95 (BP Location: Left Arm)   Pulse 61   Temp 97.7 F (36.5 C) (Oral)   Resp 16   Ht 5\' 8"  (1.727 m)   Wt 86.2 kg   SpO2 99%   BMI 28.89 kg/m   Physical Exam Vitals and nursing note reviewed.  Constitutional:      General: He is not in acute distress.    Appearance: He is well-developed. He is not diaphoretic.  HENT:     Head: Normocephalic and atraumatic.  Cardiovascular:     Rate and Rhythm: Normal rate and regular rhythm.     Heart  sounds: Normal heart sounds. No murmur heard.   Pulmonary:     Effort: Pulmonary effort is normal.     Breath sounds: Normal breath sounds. No decreased breath sounds.  Chest:     Chest wall: No tenderness.  Abdominal:     Palpations: Abdomen is soft.     Tenderness: There is no abdominal tenderness.  Musculoskeletal:     Right lower leg: No tenderness. No edema.     Left lower leg: No tenderness. No edema.  Skin:    General: Skin is warm and dry.     Findings: No rash.  Neurological:     Mental Status: He is alert and oriented to person, place, and time.  Psychiatric:        Behavior: Behavior normal.     ED Results / Procedures / Treatments   Labs (all labs ordered are listed, but only abnormal results are displayed) Labs Reviewed  BASIC METABOLIC PANEL - Abnormal; Notable for the following components:      Result Value   Glucose, Bld 165 (*)    All other components within normal limits  RESPIRATORY PANEL BY RT PCR (FLU A&B, COVID)  CBC  TROPONIN I (HIGH SENSITIVITY)  TROPONIN I (HIGH SENSITIVITY)    EKG None  Radiology DG Chest 2 View  Result Date: 03/24/2020 CLINICAL DATA:  Right-sided chest pain for 1 week. Coronary artery disease. EXAM: CHEST - 2 VIEW COMPARISON:  02/08/2020 FINDINGS: The heart size and mediastinal contours are within normal limits. Coronary artery stent again noted. Both lungs are clear. The visualized skeletal structures are unremarkable. IMPRESSION: No active cardiopulmonary disease. Electronically Signed   By: Marlaine Hind M.D.   On: 03/24/2020 08:14    Procedures Procedures (including critical care time)  Medications Ordered in ED Medications -  No data to display  ED Course  I have reviewed the triage vital signs and the nursing notes.  Pertinent labs & imaging results that were available during my care of the patient were reviewed by me and considered in my medical decision making (see chart for details).  Clinical Course as of Mar 24 1114  Sun Mar 24, 7266  [id number] 59 year old male with right side chest discomfort as above. On exam, no chest wall tenderness, HRRR, lungs CTA. Abdomen soft and non tender, no lower extremity edema.  No pain at this time however reports discomfort reproduced with taking a deep breath on exam.  Review of notes from 09/2018 admission, concern for recurrent stenosis.  Discussed with Dr. Debara Pickett, on call with cardiology who will come and see the patient.   [LM]  [id number] Patient was seen by Dr. Debara Pickett, cleared for discharge.  Requests prescription for Ranexa 500 mg twice daily.  Patient discharged to follow-up with his cardiologist, return to ER for worsening or concerning symptoms.   [LM]    Clinical Course User Index [LM] Roque Lias   MDM Rules/Calculators/A&P                          Final Clinical Impression(s) / ED Diagnoses Final diagnoses:  Atypical chest pain    Rx / DC Orders ED Discharge Orders         Ordered    ranolazine (RANEXA) 500 MG 12 hr tablet  2 times daily        03/24/20 1114           Roque Lias 03/24/20 1115    Veryl Speak, MD 03/24/20 1347

## 2020-03-24 NOTE — Discharge Instructions (Addendum)
Take Ranexa as prescribed and follow-up with your cardiologist.  Return to ER for worsening or concerning symptoms.

## 2020-03-24 NOTE — Consult Note (Signed)
CONSULTATION NOTE   Patient Name: [name] Date of Encounter: 03/24/2020 Cardiologist: Pixie Casino, MD  Chief Complaint   Chest pain  Patient Profile   60 yo male with history of CAD and multiple PCI's, presents with chest pain  HPI   [name] is a 59 y.o. male who is being seen today for the evaluation of chest pain at the request of Dr. Stark Jock. This is a 60 year old male patient of mine that I followed for a number of years with history of coronary artery disease and multiple prior cardiac catheterizations.  He does have a history of microvascular angina as well as macrovascular coronary disease.  He has been struggling with major depressive disorder, suicidal ideation and recently was noted to have elevated blood alcohol level in the emergency department.  He was admitted in April of this year with unstable angina and was found to have some in-stent restenosis.  He did have a successful angioplasty.  Ranexa was added.  He now presents with dull right-sided chest discomfort that is nonradiating.  It sometimes with exertion and other times occurs at rest.  Although he thinks it was similar to his symptoms that he had earlier this year.  Troponin is negative x2.  EKG shows sinus rhythm with no ischemic changes.  Chest x-ray is negative.  Viral respiratory panel is pending.   PMHx   Past Medical History:  Diagnosis Date  . Allergies   . Arthritis   . Chest pain   . Chronic lower back pain   . Chronic pain of right wrist   . Coronary artery disease    a. Multiple prior caths/PCI. Cath 2013 with possible spasm of RCA, 70% ISR of mid LCx with subsequent DES to mLCx and prox LCX. b. H/o microvascular angina. c. Recurrent angina 08/2014 - s/p PTCA/DES to prox Cx, PTCA/CBA to OM1.  c. LHC 06/10/15 with patent stents and some ISR in LCX and OM-1 that was not flow limiting --> Rx   . Dyslipidemia    a. Intolerant to many statins except tolerating Livalo.  Marland Kitchen GERD  (gastroesophageal reflux disease)   . H/O cardiac catheterization 10/25/2018  . Heart attack ([place])   . Hypertension   . Myocardial infarction ([place]) ~ 2010  . S/P angioplasty with stent, DES, to proximal and mid LCX 12/15/11 12/15/2011  . Shoulder pain   . Stroke [facility])    pt. reports had a stroke around time of MI 2010  . Type II diabetes mellitus ([place])   . Unstable angina [facility])     Past Surgical History:  Procedure Laterality Date  . CARDIAC CATHETERIZATION  06/15/2002   LAD with prox 40% stenosis, norma L main, Cfx with 25% lesion, RCA with long mid 25% stenosis (Dr. Vita Barley)  . CARDIAC CATHETERIZATION  04/01/2010   normal L main, LAD wit mild stenosis, L Cfx with 70% in-stent restenosis, RCA with 70% in-stent restenosis, LVEF >60% (Dr. K. Mali  Voth) - cutting ballon arthrectomy to RCA & Cfx (Dr. Rockne Menghini)  . CARDIAC CATHETERIZATION  08/25/2010   preserved global LV contractility; multivessel CAD, diffuse 90-95% in-stent restenosis in prox placed Cfx stent - cutting balloon arthrectomy in Cfx with multiple dilatations 90-95% to 0% stenosis (Dr. Corky Downs)  . CARDIAC CATHETERIZATION  01/26/2011   PCI & stenting of aggresive in-stent restenosis within previously stented AV groove Cfx with 3.0x36mm Taxus DES (previous stents were Promus) (Dr. Adora Fridge)  . CARDIAC CATHETERIZATION  05/11/2011  preserved LV function, 40% mid LAD stenosis, 30-40% narrowing proximal to stented semgnet of prox Cfx, patent mid RCA stent with smooth 20% narrowing in distal RCA (Dr. Corky Downs)  . CARDIAC CATHETERIZATION  12/15/2011   PCI & stenting of proximal & mid Cfx with DES - 3.0x39mm in proximal, 3.0x28mm in mid (Dr. Adora Fridge)  . CARDIAC CATHETERIZATION N/A 06/10/2015   Procedure: Left Heart Cath and Coronary Angiography;  Surgeon: Jolaine Artist, MD;  Location: [place] CV LAB;  Service: Cardiovascular;  Laterality: N/A;  . CARDIAC CATHETERIZATION  10/25/2018  . cardiometabolic testing  11/23/7822    good exercise effort, peak VO2 79% predicted with normal VO2 HR curves (mild deconditioning)  . COLONOSCOPY  12/2012   diminutive hyperplastic sigmoid poyp so repeat routine 2024  . CORONARY BALLOON ANGIOPLASTY N/A 10/25/2018   Procedure: CORONARY BALLOON ANGIOPLASTY;  Surgeon: Jettie Booze, MD;  Location: [place] [facility];  Service: Cardiovascular;  Laterality: N/A;  . CORONARY BALLOON ANGIOPLASTY N/A 09/29/2019   Procedure: CORONARY BALLOON ANGIOPLASTY;  Surgeon: Nelva Bush, MD;  Location: [place] [facility];  Service: Cardiovascular;  Laterality: N/A;  . EXCISIONAL HEMORRHOIDECTOMY  1984  . INTRAVASCULAR ULTRASOUND/IVUS N/A 09/29/2019   Procedure: Intravascular Ultrasound/IVUS;  Surgeon: Nelva Bush, MD;  Location: [place] [facility];  Service: Cardiovascular;  Laterality: N/A;  . LEFT HEART CATH AND CORONARY ANGIOGRAPHY N/A 10/25/2018   Procedure: LEFT HEART CATH AND CORONARY ANGIOGRAPHY;  Surgeon: Jettie Booze, MD;  Location: [place] [facility];  Service: Cardiovascular;  Laterality: N/A;  . LEFT HEART CATH AND CORONARY ANGIOGRAPHY N/A 09/29/2019   Procedure: LEFT HEART CATH AND CORONARY ANGIOGRAPHY;  Surgeon: Nelva Bush, MD;  Location: [place] [facility];  Service: Cardiovascular;  Laterality: N/A;  . LEFT HEART CATHETERIZATION WITH CORONARY ANGIOGRAM N/A 05/11/2011   Procedure: LEFT HEART CATHETERIZATION WITH CORONARY ANGIOGRAM;  Surgeon: Troy Sine, MD;  Location: [facility] CATH LAB;  Service: Cardiovascular;  Laterality: N/A;  Possible percutaneous coronary intervention, possible IVUS  . LEFT HEART CATHETERIZATION WITH CORONARY ANGIOGRAM N/A 12/15/2011   Procedure: LEFT HEART CATHETERIZATION WITH CORONARY ANGIOGRAM;  Surgeon: Lorretta Harp, MD;  Location: [facility] CATH LAB;  Service: Cardiovascular;  Laterality: N/A;  . LEFT HEART CATHETERIZATION WITH CORONARY ANGIOGRAM N/A 09/05/2014   Procedure: LEFT HEART CATHETERIZATION WITH CORONARY ANGIOGRAM;  Surgeon: Burnell Blanks, MD;  Location: [facility] CATH LAB;  Service: Cardiovascular;  Laterality: N/A;  . LIPOMA EXCISION     back of the head  . NM MYOCAR PERF WALL MOTION  02/2012   lexiscan myoview; mild perfusion defect in mid inferolateral & basal inferolateral region (infarct/scar); EF 52%, abnormal but ow risk scan  . PERCUTANEOUS CORONARY STENT INTERVENTION (PCI-S)  09/05/2014   Procedure: PERCUTANEOUS CORONARY STENT INTERVENTION (PCI-S);  Surgeon: Burnell Blanks, MD;  Location: [facility] CATH LAB;  Service: Cardiovascular;;    FAMHx   Family History  Problem Relation Age of Onset  . Leukemia Mother   . Prostate cancer Father   . Coronary artery disease Paternal Grandmother   . Cancer Paternal Grandfather   . Cancer Brother     SOCHx    reports that he quit smoking about 17 months ago. His smoking use included cigarettes. He has a 10.00 pack-year smoking history. He has never used smokeless tobacco. He reports current alcohol use. He reports current drug use. Drug: Cocaine.  Outpatient Medications   No current facility-administered medications on file prior to encounter.   Current  Outpatient Medications on File Prior to Encounter  Medication Sig Dispense Refill  . amLODipine (NORVASC) 5 MG tablet Take 1 tablet (5 mg total) by mouth daily. For high blood pressure 15 tablet 0  . aspirin EC 81 MG EC tablet Take 1 tablet (81 mg total) by mouth daily. (May buy from over the counter): Swallow whole for heart health 30 tablet 0  . benzocaine (ORAJEL) 10 % mucosal gel Use as directed in the mouth or throat 4 (four) times daily as needed for mouth pain (toothache). 5.3 g 0  . folic acid (FOLVITE) 1 MG tablet Take 1 tablet (1 mg total) by mouth daily. (May buy from over the counter): For folate replacement 30 tablet 0  . hydrOXYzine (ATARAX/VISTARIL) 25 MG tablet Take 1 tablet (25 mg total) by mouth 3 (three) times daily as needed for anxiety. 75 tablet 0  . isosorbide mononitrate (IMDUR) 30 MG 24 hr  tablet Take 3 tablets (90 mg total) by mouth daily. For chest pain 45 tablet 0  . lisinopril (ZESTRIL) 10 MG tablet Take 1 tablet (10 mg total) by mouth daily. For high blood pressure 15 tablet 0  . metFORMIN (GLUCOPHAGE) 500 MG tablet TAKE 1 TABLET BY MOUTH TWICE DAILY WITH A MEAL 180 tablet 2  . metoprolol tartrate (LOPRESSOR) 100 MG tablet Take 1 tablet (100 mg total) by mouth 2 (two) times daily. 30 tablet 0  . nitroGLYCERIN (NITROSTAT) 0.4 MG SL tablet Place 1 tablet (0.4 mg total) under the tongue every 5 (five) minutes x 3 doses as needed for chest pain. 25 tablet 12  . sertraline (ZOLOFT) 50 MG tablet Take 1 tablet (50 mg total) by mouth daily. For depression 30 tablet 0  . thiamine 100 MG tablet Take 1 tablet (100 mg total) by mouth daily. (May buy from over the counter): For thiamine replacement 30 tablet 0  . ticagrelor (BRILINTA) 90 MG TABS tablet Take 1 tablet (90 mg total) by mouth 2 (two) times daily. For cardiac issues 30 tablet 0  . traZODone (DESYREL) 50 MG tablet Take 1 tablet (50 mg total) by mouth at bedtime as needed for sleep. 30 tablet 0    Inpatient Medications    Scheduled Meds:   Continuous Infusions:   PRN Meds:    ALLERGIES   Allergies  Allergen Reactions  . Bee Venom Anaphylaxis and Hives  . Shellfish Allergy Anaphylaxis and Hives  . Statins Other (See Comments)    Myalgias. Tolerating livalo. Pain   . Statins   . Testosterone Cypionate     Testerone Injection --Increased breast tissue     ROS   Pertinent items noted in HPI and remainder of comprehensive ROS otherwise negative.  Vitals   Vitals:   03/24/20 0743 03/24/20 0744  BP: (!) 142/95   Pulse: 61   Resp: 16   Temp: 97.7 F (36.5 C)   TempSrc: Oral   SpO2: 99%   Weight:  86.2 kg  Height:  5\' 8"  (1.727 m)   No intake or output data in the 24 hours ending 03/24/20 1108 Filed Weights   03/24/20 0744  Weight: 86.2 kg    Physical Exam   General appearance: alert and no  distress Neck: no carotid bruit, no JVD and thyroid not enlarged, symmetric, no tenderness/mass/nodules Lungs: clear to auscultation bilaterally Heart: regular rate and rhythm, S1, S2 normal, no murmur, click, rub or gallop Abdomen: soft, non-tender; bowel sounds normal; no masses,  no organomegaly Extremities: extremities normal, atraumatic,  no cyanosis or edema Pulses: 2+ and symmetric Skin: Skin color, texture, turgor normal. No rashes or lesions Neurologic: Grossly normal Psych: Pleasant  Labs   Results for orders placed or performed during the hospital encounter of 03/24/20 (from the past 48 hour(s))  Basic metabolic panel     Status: Abnormal   Collection Time: 03/24/20  7:50 AM  Result Value Ref Range   Sodium 136 135 - 145 mmol/L   Potassium 4.1 3.5 - 5.1 mmol/L   Chloride 102 98 - 111 mmol/L   CO2 22 22 - 32 mmol/L   Glucose, Bld 165 (H) 70 - 99 mg/dL    Comment: Glucose reference range applies only to samples taken after fasting for at least 8 hours.   BUN 15 6 - 20 mg/dL   Creatinine, Ser 1.00 0.61 - 1.24 mg/dL   Calcium 9.7 8.9 - 10.3 mg/dL   GFR calc non Af Amer >60 >60 mL/min   GFR calc Af Amer >60 >60 mL/min   Anion gap 12 5 - 15    Comment: Performed at [facility] [address] [place] [address]  CBC     Status: None   Collection Time: 03/24/20  7:50 AM  Result Value Ref Range   WBC 6.4 4.0 - 10.5 K/uL   RBC 5.35 4.22 - 5.81 MIL/uL   Hemoglobin 15.1 13.0 - 17.0 g/dL   HCT 46.6 39 - 52 %   MCV 87.1 80.0 - 100.0 fL   MCH 28.2 26.0 - 34.0 pg   MCHC 32.4 30.0 - 36.0 g/dL   RDW 12.9 11.5 - 15.5 %   Platelets 267 150 - 400 K/uL   nRBC 0.0 0.0 - 0.2 %    Comment: Performed at [facility], [place] [address] [place], [place] [address]  Troponin I (High Sensitivity)     Status: None   Collection Time: 03/24/20  7:50 AM  Result Value Ref Range   Troponin I (High Sensitivity) 7 <18 ng/L    Comment: (NOTE) Elevated high sensitivity troponin  I (hsTnI) values and significant  changes across serial measurements may suggest ACS but many other  chronic and acute conditions are known to elevate hsTnI results.  Refer to the "Links" section for chest pain algorithms and additional  guidance. Performed at [facility], [place] [address] [place], [place] [address]   Troponin I (High Sensitivity)     Status: None   Collection Time: 03/24/20  9:28 AM  Result Value Ref Range   Troponin I (High Sensitivity) 6 <18 ng/L    Comment: (NOTE) Elevated high sensitivity troponin I (hsTnI) values and significant  changes across serial measurements may suggest ACS but many other  chronic and acute conditions are known to elevate hsTnI results.  Refer to the "Links" section for chest pain algorithms and additional  guidance. Performed at [facility], [place] [address] [place], [place] [address]   Respiratory Panel by RT PCR (Flu A&B, Covid) - Nasopharyngeal Swab     Status: None   Collection Time: 03/24/20  9:32 AM   Specimen: Nasopharyngeal Swab  Result Value Ref Range   SARS Coronavirus 2 by RT PCR NEGATIVE NEGATIVE    Comment: (NOTE) SARS-CoV-2 target nucleic acids are NOT DETECTED.  The SARS-CoV-2 RNA is generally detectable in upper respiratoy specimens during the acute phase of infection. The lowest concentration of SARS-CoV-2 viral copies this assay can detect is 131 copies/mL. A  negative result does not preclude SARS-Cov-2 infection and should not be used as the sole basis for treatment or other patient management decisions. A negative result may occur with  improper specimen collection/handling, submission of specimen other than nasopharyngeal swab, presence of viral mutation(s) within the areas targeted by this assay, and inadequate number of viral copies (<131 copies/mL). A negative result must be combined with clinical observations, patient history, and epidemiological information. The expected result is  Negative.  Fact Sheet for Patients:  PinkCheek.be  Fact Sheet for Healthcare Providers:  GravelBags.it  This test is no t yet approved or cleared by the [place] FDA and  has been authorized for detection and/or diagnosis of SARS-CoV-2 by FDA under an Emergency Use Authorization (EUA). This EUA will remain  in effect (meaning this test can be used) for the duration of the COVID-19 declaration under Section 564(b)(1) of the Act, 21 U.S.C. section 360bbb-3(b)(1), unless the authorization is terminated or revoked sooner.     Influenza A by PCR NEGATIVE NEGATIVE   Influenza B by PCR NEGATIVE NEGATIVE    Comment: (NOTE) The Xpert Xpress SARS-CoV-2/FLU/RSV assay is intended as an aid in  the diagnosis of influenza from Nasopharyngeal swab specimens and  should not be used as a sole basis for treatment. Nasal washings and  aspirates are unacceptable for Xpert Xpress SARS-CoV-2/FLU/RSV  testing.  Fact Sheet for Patients: PinkCheek.be  Fact Sheet for Healthcare Providers: GravelBags.it  This test is not yet approved or cleared by the [place] FDA and  has been authorized for detection and/or diagnosis of SARS-CoV-2 by  FDA under an Emergency Use Authorization (EUA). This EUA will remain  in effect (meaning this test can be used) for the duration of the  Covid-19 declaration under Section 564(b)(1) of the Act, 21  U.S.C. section 360bbb-3(b)(1), unless the authorization is  terminated or revoked. Performed at [facility], [place] [address] [place], [place] [address]     ECG   Normal sinus rhythm - Personally Reviewed  Telemetry   Sinus rhythm - Personally Reviewed  Radiology   DG Chest 2 View  Result Date: 03/24/2020 CLINICAL DATA:  Right-sided chest pain for 1 week. Coronary artery disease. EXAM: CHEST - 2 VIEW COMPARISON:  02/08/2020 FINDINGS:  The heart size and mediastinal contours are within normal limits. Coronary artery stent again noted. Both lungs are clear. The visualized skeletal structures are unremarkable. IMPRESSION: No active cardiopulmonary disease. Electronically Signed   By: Marlaine Hind M.D.   On: 03/24/2020 08:14    Cardiac Studies   N/A  Impression   Active Problems:   Atypical chest pain   Recommendation   1. [name] is describing atypical right-sided chest discomfort which is somewhat pleuritic.  This is much different than his presentation in April in which he was exercising and had progressive chest discomfort which markedly limited his activities and was found to have in-stent restenosis.  While the timing is right for recurrent in-stent restenosis, negative troponins, normal EKG and less typical symptoms make me less inclined to think that this is an issue.  I had also recommended him to be on Ranexa at his prior discharge but he never started the medication.  Would recommend starting 500 mg twice daily.  He has come off of lisinopril and is confused as to whether or not he needs to take the medicine.  Blood pressure looks good today.  Ultimately while it would be helpful for him from a cardiac standpoint,  I would continue to hold off on it for this moment given his normal to low normal blood pressure.  Thanks for the consultation.  He could be discharged from the emergency department and we will arrange follow-up in the office in several weeks.  Time Spent Directly with Patient:  I have spent a total of 45 minutes with the patient reviewing hospital notes, telemetry, EKGs, labs and examining the patient as well as establishing an assessment and plan that was discussed personally with the patient.  > 50% of time was spent in direct patient care.  Length of Stay:  LOS: 0 days   Pixie Casino, MD, [facility], [place] Director of the Advanced Lipid Disorders &   Cardiovascular Risk Reduction Clinic Diplomate of the American Board of Clinical Lipidology Attending Cardiologist  Direct Dial: [phone]  Fax: [phone]  Website:  [email or web address] 03/24/2020, 11:08 AM

## 2020-03-25 ENCOUNTER — Telehealth: Payer: Self-pay | Admitting: Internal Medicine

## 2020-03-25 NOTE — Telephone Encounter (Signed)
Pt called saying he saw our number on his caller ID but no message.. I reviewed his chart and I could not see who called him but I reviewed his recent D/C summary from Dr. Debara Pickett and the pt needs an appt after starting  Ranexa... I made him an appt for 05/07/20... but declined any sooner appts but will call if he has any problems prior to his appt. He is seeing his PCP 04/01/20.

## 2020-03-25 NOTE — Telephone Encounter (Signed)
[name] is calling stating he is returning a call from our office, but I am unable to find documentation of what it was in regards to. Please advise.

## 2020-04-01 ENCOUNTER — Ambulatory Visit (HOSPITAL_COMMUNITY): Payer: Medicaid Other

## 2020-04-01 ENCOUNTER — Ambulatory Visit (INDEPENDENT_AMBULATORY_CARE_PROVIDER_SITE_OTHER): Payer: Medicaid Other | Admitting: Family Medicine

## 2020-04-01 ENCOUNTER — Encounter: Payer: Self-pay | Admitting: Family Medicine

## 2020-04-01 ENCOUNTER — Other Ambulatory Visit: Payer: Self-pay

## 2020-04-01 VITALS — BP 132/84 | HR 59 | Temp 98.7°F | Ht 68.0 in | Wt 188.4 lb

## 2020-04-01 DIAGNOSIS — M21612 Bunion of left foot: Secondary | ICD-10-CM

## 2020-04-01 DIAGNOSIS — E1159 Type 2 diabetes mellitus with other circulatory complications: Secondary | ICD-10-CM | POA: Diagnosis not present

## 2020-04-01 DIAGNOSIS — R7309 Other abnormal glucose: Secondary | ICD-10-CM

## 2020-04-01 DIAGNOSIS — F419 Anxiety disorder, unspecified: Secondary | ICD-10-CM | POA: Diagnosis not present

## 2020-04-01 DIAGNOSIS — M79674 Pain in right toe(s): Secondary | ICD-10-CM

## 2020-04-01 DIAGNOSIS — B356 Tinea cruris: Secondary | ICD-10-CM

## 2020-04-01 DIAGNOSIS — M21611 Bunion of right foot: Secondary | ICD-10-CM

## 2020-04-01 DIAGNOSIS — R739 Hyperglycemia, unspecified: Secondary | ICD-10-CM | POA: Diagnosis not present

## 2020-04-01 DIAGNOSIS — J302 Other seasonal allergic rhinitis: Secondary | ICD-10-CM

## 2020-04-01 DIAGNOSIS — I10 Essential (primary) hypertension: Secondary | ICD-10-CM

## 2020-04-01 DIAGNOSIS — F32A Depression, unspecified: Secondary | ICD-10-CM

## 2020-04-01 DIAGNOSIS — Z09 Encounter for follow-up examination after completed treatment for conditions other than malignant neoplasm: Secondary | ICD-10-CM

## 2020-04-01 DIAGNOSIS — G8929 Other chronic pain: Secondary | ICD-10-CM

## 2020-04-01 LAB — POCT URINALYSIS DIPSTICK
Bilirubin, UA: NEGATIVE
Blood, UA: NEGATIVE
Glucose, UA: NEGATIVE
Ketones, UA: NEGATIVE
Leukocytes, UA: NEGATIVE
Nitrite, UA: NEGATIVE
Protein, UA: NEGATIVE
Spec Grav, UA: 1.02 (ref 1.010–1.025)
Urobilinogen, UA: 0.2 E.U./dL
pH, UA: 5.5 (ref 5.0–8.0)

## 2020-04-01 LAB — POCT GLYCOSYLATED HEMOGLOBIN (HGB A1C)
HbA1c POC (<> result, manual entry): 6 % (ref 4.0–5.6)
HbA1c, POC (controlled diabetic range): 6 % (ref 0.0–7.0)
HbA1c, POC (prediabetic range): 6 % (ref 5.7–6.4)
Hemoglobin A1C: 6 % — AB (ref 4.0–5.6)

## 2020-04-01 MED ORDER — FLUCONAZOLE 150 MG PO TABS: ORAL_TABLET | ORAL | 0 refills | Status: DC

## 2020-04-01 NOTE — Progress Notes (Signed)
Patient [name] Follow Up  Subjective:  Patient ID: [name], male    DOB: 06/19/1961  Age: 59 y.o. MRN: [id number]  CC:  Chief Complaint  Patient presents with  . Follow-up    Pt states he needs refill on his medication. Pt also states his R big toe is swollen and in pain. X 25months.   . Sinusitis    Pt states he has a lot of nasal drip.X5 months.    HPI [name] is a 59 year old male who presents for Follow Up today.    Patient Active Problem List   Diagnosis Date Noted  . Alcohol dependence ([place]) 03/19/2020  . MDD (major depressive disorder), recurrent episode, severe ([place]) 03/18/2020  . History of substance abuse ([place]) 01/04/2020  . Severe 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 features ([place]) 12/10/2019  . Tinea cruris 07/06/2019  . Hemoglobin A1c less than 7.0% 07/06/2019  . Muscle strain of wrist 04/05/2019  . Pain in both wrists 04/05/2019  . Anxiety 04/05/2019  . Prediabetes 04/05/2019  . MDD (major depressive disorder) 01/12/2019  . Chest pain 10/25/2018  . MDD (major depressive disorder), recurrent severe, without psychosis ([name]) 08/15/2018  . Influenza A 07/17/2017  . Lactic acidosis   . Cough   . Nausea and vomiting   . Severe episode of recurrent major depressive disorder, without psychotic features ([place])   . MDD (major depressive disorder), single episode, severe with psychotic features ([name]) 06/27/2017  . MDD (major depressive disorder), severe ([name]) 06/23/2017  . Essential hypertension 01/13/2016  . Unstable angina ([place])   . ED (erectile dysfunction) 02/15/2014  . Atypical chest pain 12/14/2011  . Allergy to contrast media (used for diagnostic x-rays) 05/12/2011  . Dyslipidemia, goal LDL below 70 05/09/2011  . DM2 (diabetes mellitus, type 2) ([place]) 07/10/2010  . CAD S/P percutaneous coronary angioplasty 07/10/2010     Current Status: Since his last office visit, he has had an  ED visit for Anxiety. Today, he is doing well with no complaints. His anxiety is stable today. He denies suicidal ideations, homicidal ideations, or auditory hallucinations. He continues to follow up with Psychiatry once monthly and group meetings 2 times a week a He has not been monitoring his blood glucose levels lately. He denies fatigue, frequent urination, blurred vision, excessive hunger, excessive thirst, weight gain, weight loss, and poor wound healing. He continues to check his feet regularly. He denies visual changes, chest pain, cough, shortness of breath, heart palpitations, and falls. He has occasional headaches and dizziness with position changes. Denies severe headaches, confusion, seizures, double vision, and blurred vision, nausea and vomiting. He denies fevers, chills, recent infections, weight loss, and night sweats. Denies GI problems such as diarrhea, and constipation. He has no reports of blood in stools, dysuria and hematuria. No depression or anxiety reported today. He is taking all medications as prescribed.  Past Medical History:  Diagnosis Date  . Allergies   . Arthritis   . Chest pain   . Chronic lower back pain   . Chronic pain of right wrist   . Coronary artery disease    a. Multiple prior caths/PCI. Cath 2013 with possible spasm of RCA, 70% ISR of mid LCx with subsequent DES to mLCx and prox LCX. b. H/o microvascular angina. c. Recurrent angina 08/2014 - s/p PTCA/DES to prox Cx, PTCA/CBA to OM1.  c. LHC 06/10/15 with patent stents and some ISR in LCX  and OM-1 that was not flow limiting --> Rx   . Dyslipidemia    a. Intolerant to many statins except tolerating Livalo.  Marland Kitchen GERD (gastroesophageal reflux disease)   . H/O cardiac catheterization 10/25/2018  . Heart attack ([place])   . Hypertension   . Myocardial infarction ([name]) ~ 2010  . S/P angioplasty with stent, DES, to proximal and mid LCX 12/15/11 12/15/2011  . Shoulder pain   . Stroke [facility])    pt. reports had a stroke  around time of MI 2010  . Type II diabetes mellitus ([name])   . Unstable angina [facility])     Past Surgical History:  Procedure Laterality Date  . CARDIAC CATHETERIZATION  06/15/2002   LAD with prox 40% stenosis, norma L main, Cfx with 25% lesion, RCA with long mid 25% stenosis (Dr. Vita Barley)  . CARDIAC CATHETERIZATION  04/01/2010   normal L main, LAD wit mild stenosis, L Cfx with 70% in-stent restenosis, RCA with 70% in-stent restenosis, LVEF >60% (Dr. K. Mali Hilty) - cutting ballon arthrectomy to RCA & Cfx (Dr. Rockne Menghini)  . CARDIAC CATHETERIZATION  08/25/2010   preserved global LV contractility; multivessel CAD, diffuse 90-95% in-stent restenosis in prox placed Cfx stent - cutting balloon arthrectomy in Cfx with multiple dilatations 90-95% to 0% stenosis (Dr. Corky Downs)  . CARDIAC CATHETERIZATION  01/26/2011   PCI & stenting of aggresive in-stent restenosis within previously stented AV groove Cfx with 3.0x63mm Taxus DES (previous stents were Promus) (Dr. Adora Fridge)  . CARDIAC CATHETERIZATION  05/11/2011   preserved LV function, 40% mid LAD stenosis, 30-40% narrowing proximal to stented semgnet of prox Cfx, patent mid RCA stent with smooth 20% narrowing in distal RCA (Dr. Corky Downs)  . CARDIAC CATHETERIZATION  12/15/2011   PCI & stenting of proximal & mid Cfx with DES - 3.0x29mm in proximal, 3.0x85mm in mid (Dr. Adora Fridge)  . CARDIAC CATHETERIZATION N/A 06/10/2015   Procedure: Left Heart Cath and Coronary Angiography;  Surgeon: Jolaine Artist, MD;  Location: [place] CV LAB;  Service: Cardiovascular;  Laterality: N/A;  . CARDIAC CATHETERIZATION  10/25/2018  . cardiometabolic testing  12/30/5007   good exercise effort, peak VO2 79% predicted with normal VO2 HR curves (mild deconditioning)  . COLONOSCOPY  12/2012   diminutive hyperplastic sigmoid poyp so repeat routine 2024  . CORONARY BALLOON ANGIOPLASTY N/A 10/25/2018   Procedure: CORONARY BALLOON ANGIOPLASTY;  Surgeon: Jettie Booze,  MD;  Location: [place] [facility] LAB;  Service: Cardiovascular;  Laterality: N/A;  . CORONARY BALLOON ANGIOPLASTY N/A 09/29/2019   Procedure: CORONARY BALLOON ANGIOPLASTY;  Surgeon: Nelva Bush, MD;  Location: [place] CV LAB;  Service: Cardiovascular;  Laterality: N/A;  . EXCISIONAL HEMORRHOIDECTOMY  1984  . INTRAVASCULAR ULTRASOUND/IVUS N/A 09/29/2019   Procedure: Intravascular Ultrasound/IVUS;  Surgeon: Nelva Bush, MD;  Location: [place] CV LAB;  Service: Cardiovascular;  Laterality: N/A;  . LEFT HEART CATH AND CORONARY ANGIOGRAPHY N/A 10/25/2018   Procedure: LEFT HEART CATH AND CORONARY ANGIOGRAPHY;  Surgeon: Jettie Booze, MD;  Location: [place] CV LAB;  Service: Cardiovascular;  Laterality: N/A;  . LEFT HEART CATH AND CORONARY ANGIOGRAPHY N/A 09/29/2019   Procedure: LEFT HEART CATH AND CORONARY ANGIOGRAPHY;  Surgeon: Nelva Bush, MD;  Location: [place] [facility] LAB;  Service: Cardiovascular;  Laterality: N/A;  . LEFT HEART CATHETERIZATION WITH CORONARY ANGIOGRAM N/A 05/11/2011   Procedure: LEFT HEART CATHETERIZATION WITH CORONARY ANGIOGRAM;  Surgeon: Troy Sine, MD;  Location: [name] CATH LAB;  Service: Cardiovascular;  Laterality: N/A;  Possible percutaneous coronary intervention, possible IVUS  . LEFT HEART CATHETERIZATION WITH CORONARY ANGIOGRAM N/A 12/15/2011   Procedure: LEFT HEART CATHETERIZATION WITH CORONARY ANGIOGRAM;  Surgeon: Lorretta Harp, MD;  [facility] CATH LAB;  Service: Cardiovascular;  Laterality: N/A;  . LEFT HEART CATHETERIZATION WITH CORONARY ANGIOGRAM N/A 09/05/2014   Procedure: LEFT HEART CATHETERIZATION WITH CORONARY ANGIOGRAM;  Surgeon: Burnell Blanks, MD;  Location: [facility] CATH LAB;  Service: Cardiovascular;  Laterality: N/A;  . LIPOMA EXCISION     back of the head  . NM MYOCAR PERF WALL MOTION  02/2012   lexiscan myoview; mild perfusion defect in mid inferolateral & basal inferolateral region (infarct/scar); EF 52%, abnormal but ow risk  scan  . PERCUTANEOUS CORONARY STENT INTERVENTION (PCI-S)  09/05/2014   Procedure: PERCUTANEOUS CORONARY STENT INTERVENTION (PCI-S);  Surgeon: Burnell Blanks, MD;  Location: [facility] C[facility] LAB;  Service: Cardiovascular;;    Family History  Problem Relation Age of Onset  . Leukemia Mother   . Prostate cancer Father   . Coronary artery disease Paternal Grandmother   . Cancer Paternal Grandfather   . Cancer Brother     Social History   Socioeconomic History  . Marital status: Unknown    Spouse name: Not on file  . Number of children: 2  . Years of education: GED  . Highest education level: Not on file  Occupational History  . Not on file  Tobacco Use  . Smoking status: Former Smoker    Packs/day: 1.00    Years: 10.00    Pack years: 10.00    Types: Cigarettes    Quit date: 10/07/2018    Years since quitting: 1.4  . Smokeless tobacco: Never Used  Vaping Use  . Vaping Use: Never used  Substance and Sexual Activity  . Alcohol use: Yes    Comment: 2 bottles of wine on 12/08/2019 (one occurrence)  . Drug use: Yes    Types: Cocaine    Comment: last use several months ago  . Sexual activity: Not Currently  Other Topics Concern  . Not on file  Social History Narrative   ** Merged History Encounter **       Social Determinants of Health   Financial Resource Strain:   . Difficulty of Paying Living Expenses: Not on file  Food Insecurity:   . Worried About Charity fundraiser in the Last Year: Not on file  . Ran Out of Food in the Last Year: Not on file  Transportation Needs:   . Lack of Transportation (Medical): Not on file  . Lack of Transportation (Non-Medical): Not on file  Physical Activity:   . Days of Exercise per Week: Not on file  . Minutes of Exercise per Session: Not on file  Stress:   . Feeling of Stress : Not on file  Social Connections:   . Frequency of Communication with Friends and Family: Not on file  . Frequency of Social Gatherings with Friends and  Family: Not on file  . Attends Religious Services: Not on file  . Active Member of Clubs or Organizations: Not on file  . Attends Archivist Meetings: Not on file  . Marital Status: Not on file  Intimate Partner Violence:   . Fear of Current or Ex-Partner: Not on file  . Emotionally Abused: Not on file  . Physically Abused: Not on file  . Sexually Abused: Not on file    Outpatient Medications Prior to  Visit  Medication Sig Dispense Refill  . amLODipine (NORVASC) 5 MG tablet Take 1 tablet (5 mg total) by mouth daily. For high blood pressure 15 tablet 0  . aspirin EC 81 MG EC tablet Take 1 tablet (81 mg total) by mouth daily. (May buy from over the counter): Swallow whole for heart health 30 tablet 0  . metFORMIN (GLUCOPHAGE) 500 MG tablet TAKE 1 TABLET BY MOUTH TWICE DAILY WITH A MEAL 180 tablet 2  . metoprolol tartrate (LOPRESSOR) 100 MG tablet Take 1 tablet (100 mg total) by mouth 2 (two) times daily. 30 tablet 0  . nitroGLYCERIN (NITROSTAT) 0.4 MG SL tablet Place 1 tablet (0.4 mg total) under the tongue every 5 (five) minutes x 3 doses as needed for chest pain. 25 tablet 12  . ticagrelor (BRILINTA) 90 MG TABS tablet Take 1 tablet (90 mg total) by mouth 2 (two) times daily. For cardiac issues 30 tablet 0  . benzocaine (ORAJEL) 10 % mucosal gel Use as directed in the mouth or throat 4 (four) times daily as needed for mouth pain (toothache). (Patient not taking: Reported on 41/12/4079) 5.3 g 0  . folic acid (FOLVITE) 1 MG tablet Take 1 tablet (1 mg total) by mouth daily. (May buy from over the counter): For folate replacement (Patient not taking: Reported on 04/01/2020) 30 tablet 0  . hydrOXYzine (ATARAX/VISTARIL) 25 MG tablet Take 1 tablet (25 mg total) by mouth 3 (three) times daily as needed for anxiety. (Patient not taking: Reported on 04/01/2020) 75 tablet 0  . isosorbide mononitrate (IMDUR) 30 MG 24 hr tablet Take 3 tablets (90 mg total) by mouth daily. For chest pain (Patient not  taking: Reported on 04/01/2020) 45 tablet 0  . lisinopril (ZESTRIL) 10 MG tablet Take 1 tablet (10 mg total) by mouth daily. For high blood pressure (Patient not taking: Reported on 04/01/2020) 15 tablet 0  . ranolazine (RANEXA) 500 MG 12 hr tablet Take 1 tablet (500 mg total) by mouth 2 (two) times daily. (Patient not taking: Reported on 04/01/2020) 60 tablet 0  . sertraline (ZOLOFT) 50 MG tablet Take 1 tablet (50 mg total) by mouth daily. For depression (Patient not taking: Reported on 04/01/2020) 30 tablet 0  . thiamine 100 MG tablet Take 1 tablet (100 mg total) by mouth daily. (May buy from over the counter): For thiamine replacement (Patient not taking: Reported on 04/01/2020) 30 tablet 0  . traZODone (DESYREL) 50 MG tablet Take 1 tablet (50 mg total) by mouth at bedtime as needed for sleep. (Patient not taking: Reported on 04/01/2020) 30 tablet 0   No facility-administered medications prior to visit.    Allergies  Allergen Reactions  . Bee Venom Anaphylaxis and Hives  . Shellfish Allergy Anaphylaxis and Hives  . Statins Other (See Comments)    Myalgias. Tolerating livalo. Pain   . Statins   . Testosterone Cypionate     Testerone Injection --Increased breast tissue     ROS Review of Systems  Constitutional: Negative.   HENT: Negative.   Eyes: Negative.   Respiratory: Negative.   Cardiovascular: Negative.   Gastrointestinal: Positive for abdominal distention.  Endocrine: Negative.   Genitourinary: Negative.   Musculoskeletal: Positive for arthralgias (generalized joint pain).  Skin: Negative.   Allergic/Immunologic: Negative.   Neurological: Positive for dizziness (occasional ) and headaches (occasional).  Hematological: Negative.   Psychiatric/Behavioral: Negative.       Objective:    Physical Exam Vitals and nursing note reviewed.  Constitutional:  Appearance: Normal appearance.  HENT:     Head: Normocephalic and atraumatic.     Nose: Nose normal.      Mouth/Throat:     Mouth: Mucous membranes are moist.     Pharynx: Oropharynx is clear.  Cardiovascular:     Rate and Rhythm: Normal rate and regular rhythm.     Pulses: Normal pulses.     Heart sounds: Normal heart sounds.  Pulmonary:     Effort: Pulmonary effort is normal.     Breath sounds: Normal breath sounds.  Abdominal:     General: Bowel sounds are normal.     Palpations: Abdomen is soft.  Musculoskeletal:        General: Normal range of motion.     Cervical back: Normal range of motion and neck supple.  Skin:    General: Skin is warm and dry.  Neurological:     General: No focal deficit present.     Mental Status: He is alert and oriented to person, place, and time.  Psychiatric:        Mood and Affect: Mood normal.        Behavior: Behavior normal.        Thought Content: Thought content normal.        Judgment: Judgment normal.     BP 132/84 (BP Location: Left Arm, Patient Position: Sitting, Cuff Size: Normal)   Pulse (!) 59   Temp 98.7 F (37.1 C)   Ht 5\' 8"  (1.727 m)   Wt 188 lb 6.4 oz (85.5 kg)   SpO2 99%   BMI 28.65 kg/m  Wt Readings from Last 3 Encounters:  04/01/20 188 lb 6.4 oz (85.5 kg)  03/24/20 190 lb (86.2 kg)  02/24/20 185 lb 3 oz (84 kg)     Health Maintenance Due  Topic Date Due  . COVID-19 Vaccine (1) Never done  . FOOT EXAM  03/03/2019  . OPHTHALMOLOGY EXAM  11/16/2019  . INFLUENZA VACCINE  01/28/2020    There are no preventive care reminders to display for this patient.  Lab Results  Component Value Date   TSH 1.089 03/19/2020   Lab Results  Component Value Date   WBC 6.4 03/24/2020   HGB 15.1 03/24/2020   HCT 46.6 03/24/2020   MCV 87.1 03/24/2020   PLT 267 03/24/2020   Lab Results  Component Value Date   NA 136 03/24/2020   K 4.1 03/24/2020   CO2 22 03/24/2020   GLUCOSE 165 (H) 03/24/2020   BUN 15 03/24/2020   CREATININE 1.00 03/24/2020   BILITOT 0.6 03/17/2020   ALKPHOS 49 03/17/2020   AST 20 03/17/2020   ALT  31 03/17/2020   PROT 6.9 03/17/2020   ALBUMIN 4.4 03/17/2020   CALCIUM 9.7 03/24/2020   ANIONGAP 12 03/24/2020   Lab Results  Component Value Date   CHOL 168 03/17/2020   Lab Results  Component Value Date   HDL 45 03/17/2020   Lab Results  Component Value Date   LDLCALC 76 03/17/2020   Lab Results  Component Value Date   TRIG 237 (H) 03/17/2020   Lab Results  Component Value Date   CHOLHDL 3.7 03/17/2020   Lab Results  Component Value Date   HGBA1C 6.0 (A) 04/01/2020   HGBA1C 6.0 04/01/2020   HGBA1C 6.0 04/01/2020   HGBA1C 6.0 04/01/2020      Assessment & Plan:   1. Hospital discharge follow-up  2. Anxiety and depression Stable today.   3. Type  2 diabetes mellitus with other circulatory complication, without long-term current use of insulin ([place]) He will continue medication as prescribed, to decrease foods/beverages high in sugars and carbs and follow Heart Healthy or DASH diet. Increase physical activity to at least 30 minutes cardio exercise daily.  - Urinalysis Dipstick - POC HgB A1c  4. Hyperglycemia  5. Hemoglobin A1c less than 7.0%  6. Essential hypertension The current medical regimen is effective; blood pressure is stable at 132/84 today; continue present plan and medications as prescribed. He will continue to take medications as prescribed, to decrease high sodium intake, excessive alcohol intake, increase potassium intake, smoking cessation, and increase physical activity of at least 30 minutes of cardio activity daily. He will continue to follow Heart Healthy or DASH diet.  7. Jock itch - fluconazole (DIFLUCAN) 150 MG tablet; Take 1 tablet weekly X 4 weeks.  Dispense: 4 tablet; Refill: 0  8. Tinea cruris - fluconazole (DIFLUCAN) 150 MG tablet; Take 1 tablet weekly X 4 weeks.  Dispense: 4 tablet; Refill: 0  9. Chronic toe pain, right foot - Ambulatory referral to Podiatry  10. Bilateral bunions - Ambulatory referral to Podiatry  11. Seasonal  allergies - Ambulatory referral to Allergy  12. Follow up He will follow up in 6 months.   Meds ordered this encounter  Medications  . fluconazole (DIFLUCAN) 150 MG tablet    Sig: Take 1 tablet weekly X 4 weeks.    Dispense:  4 tablet    Refill:  0    Orders Placed This Encounter  Procedures  . Ambulatory referral to Podiatry  . Ambulatory referral to Allergy  . Urinalysis Dipstick  . POC HgB A1c     Referral Orders     Ambulatory referral to Podiatry     Ambulatory referral to [place],  MSN, FNP-BC [place], [place] [address] [phone] fax    Problem List Items Addressed This Visit      Cardiovascular and Mediastinum   Essential hypertension (Chronic)     Endocrine   DM2 (diabetes mellitus, type 2) ([place]) - Primary (Chronic)   Relevant Orders   Urinalysis Dipstick (Completed)   POC HgB A1c (Completed)     Musculoskeletal and Integument   Tinea cruris   Relevant Medications   fluconazole (DIFLUCAN) 150 MG tablet     Other   Hemoglobin A1c less than 7.0%    Other Visit Diagnoses    Hospital discharge follow-up       Anxiety and depression       Hyperglycemia       Jock itch       Relevant Medications   fluconazole (DIFLUCAN) 150 MG tablet   Chronic toe pain, right foot       Relevant Orders   Ambulatory referral to Podiatry   Bilateral bunions       Relevant Orders   Ambulatory referral to Podiatry   Seasonal allergies       Relevant Orders   Ambulatory referral to Allergy   Follow up          Meds ordered this encounter  Medications  . fluconazole (DIFLUCAN) 150 MG tablet    Sig: Take 1 tablet weekly X 4 weeks.    Dispense:  4 tablet    Refill:  0    Follow-up: Return in about 6 months (around 09/30/2020) for Labs/OV.  Azzie Glatter, FNP

## 2020-04-02 ENCOUNTER — Ambulatory Visit (HOSPITAL_COMMUNITY): Payer: Medicaid Other

## 2020-04-03 ENCOUNTER — Other Ambulatory Visit: Payer: Self-pay

## 2020-04-03 ENCOUNTER — Ambulatory Visit (HOSPITAL_COMMUNITY): Payer: Medicaid Other

## 2020-04-04 ENCOUNTER — Other Ambulatory Visit: Payer: Self-pay

## 2020-04-04 ENCOUNTER — Encounter: Payer: Self-pay | Admitting: Family Medicine

## 2020-04-04 ENCOUNTER — Ambulatory Visit (HOSPITAL_COMMUNITY): Payer: Medicaid Other

## 2020-04-05 ENCOUNTER — Ambulatory Visit (HOSPITAL_COMMUNITY): Payer: Medicaid Other

## 2020-04-05 ENCOUNTER — Other Ambulatory Visit: Payer: Self-pay

## 2020-04-08 ENCOUNTER — Other Ambulatory Visit: Payer: Self-pay

## 2020-04-08 ENCOUNTER — Ambulatory Visit (HOSPITAL_COMMUNITY): Payer: Medicaid Other

## 2020-04-09 ENCOUNTER — Ambulatory Visit (HOSPITAL_COMMUNITY): Payer: Medicaid Other

## 2020-04-10 ENCOUNTER — Ambulatory Visit (HOSPITAL_COMMUNITY): Payer: Medicaid Other

## 2020-04-11 ENCOUNTER — Ambulatory Visit (HOSPITAL_COMMUNITY): Payer: Medicaid Other

## 2020-04-12 ENCOUNTER — Ambulatory Visit (HOSPITAL_COMMUNITY): Payer: Medicaid Other

## 2020-04-15 ENCOUNTER — Ambulatory Visit (HOSPITAL_COMMUNITY): Payer: Medicaid Other

## 2020-04-16 ENCOUNTER — Telehealth: Payer: Self-pay | Admitting: Family Medicine

## 2020-04-16 NOTE — Telephone Encounter (Signed)
Sent to [place]

## 2020-04-17 ENCOUNTER — Other Ambulatory Visit: Payer: Self-pay | Admitting: Family Medicine

## 2020-04-17 DIAGNOSIS — M21612 Bunion of left foot: Secondary | ICD-10-CM

## 2020-04-17 DIAGNOSIS — M21611 Bunion of right foot: Secondary | ICD-10-CM

## 2020-04-17 DIAGNOSIS — G8929 Other chronic pain: Secondary | ICD-10-CM

## 2020-05-03 ENCOUNTER — Other Ambulatory Visit: Payer: Self-pay

## 2020-05-03 ENCOUNTER — Ambulatory Visit (INDEPENDENT_AMBULATORY_CARE_PROVIDER_SITE_OTHER): Payer: Medicaid Other

## 2020-05-03 ENCOUNTER — Encounter: Payer: Self-pay | Admitting: Podiatry

## 2020-05-03 ENCOUNTER — Ambulatory Visit (INDEPENDENT_AMBULATORY_CARE_PROVIDER_SITE_OTHER): Payer: Medicaid Other | Admitting: Podiatry

## 2020-05-03 DIAGNOSIS — M21612 Bunion of left foot: Secondary | ICD-10-CM

## 2020-05-03 DIAGNOSIS — M21611 Bunion of right foot: Secondary | ICD-10-CM

## 2020-05-04 NOTE — Progress Notes (Signed)
Subjective:   Patient ID: [name], male   DOB: 59 y.o.   MRN: [id number]   HPI Patient states my bunion is getting worse and making it hard for me to wear shoe gear comfortably.  I need to get it fixed and my diabetes is under excellent control and I am taking good care of myself.  Patient states he wants to be more active but this impedes him from doing it   ROS      Objective:  Physical Exam  Neurovascular status intact with patient found to have good digital perfusion and is noted to have hyperostosis medial aspect first metatarsal head right over left with pain upon palpation.  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  I reviewed the condition and x-rays with him and allowed him to read consent form going over conservative treatments alternative treatments and complications.  He understands all this wants surgery and after extensive review signed consent form and fall instructions given.  Patient is scheduled for outpatient surgery and is encouraged to call with questions and understands total recovery can take 6 months to 1 year  X-rays indicate there is elevation of the intermetatarsal angle right over left with prominence around the first metatarsal head bilateral

## 2020-05-07 ENCOUNTER — Ambulatory Visit (INDEPENDENT_AMBULATORY_CARE_PROVIDER_SITE_OTHER): Payer: Medicaid Other | Admitting: Internal Medicine

## 2020-05-07 ENCOUNTER — Encounter: Payer: Self-pay | Admitting: Internal Medicine

## 2020-05-07 ENCOUNTER — Other Ambulatory Visit: Payer: Self-pay

## 2020-05-07 VITALS — BP 132/92 | HR 78 | Ht 68.0 in | Wt 189.0 lb

## 2020-05-07 DIAGNOSIS — I2510 Atherosclerotic heart disease of native coronary artery without angina pectoris: Secondary | ICD-10-CM

## 2020-05-07 DIAGNOSIS — E785 Hyperlipidemia, unspecified: Secondary | ICD-10-CM | POA: Diagnosis not present

## 2020-05-07 DIAGNOSIS — I10 Essential (primary) hypertension: Secondary | ICD-10-CM | POA: Diagnosis not present

## 2020-05-07 DIAGNOSIS — E119 Type 2 diabetes mellitus without complications: Secondary | ICD-10-CM

## 2020-05-07 DIAGNOSIS — Z9861 Coronary angioplasty status: Secondary | ICD-10-CM

## 2020-05-07 DIAGNOSIS — F322 Major depressive disorder, single episode, severe without psychotic features: Secondary | ICD-10-CM

## 2020-05-07 MED ORDER — NITROGLYCERIN 0.4 MG SL SUBL
0.4000 mg | SUBLINGUAL_TABLET | SUBLINGUAL | 3 refills | Status: DC | PRN
Start: 2020-05-07 — End: 2021-07-18

## 2020-05-07 MED ORDER — AMLODIPINE BESYLATE 10 MG PO TABS
10.0000 mg | ORAL_TABLET | Freq: Every day | ORAL | 3 refills | Status: DC
Start: 2020-05-07 — End: 2021-05-29

## 2020-05-07 MED ORDER — METOPROLOL TARTRATE 100 MG PO TABS
100.0000 mg | ORAL_TABLET | Freq: Two times a day (BID) | ORAL | 3 refills | Status: DC
Start: 2020-05-07 — End: 2020-09-30

## 2020-05-07 NOTE — Progress Notes (Signed)
OFFICE NOTE  Chief Complaint:  Follow-up chest pain  Primary Care Physician: Azzie Glatter, FNP  HPI:  [name] a 59 year old gentleman who has a history of coronary disease, hypertension, dyslipidemia, diabetes type 2, and microvascular angina. He has had multiple PCIs, in-stent restenosis, but has been actually fairly stable since his last drug-eluting stent placement in June 2013. He had a nuclear stress test which showed some mild abnormalities and was considered low risk and has been treated medically.  He had chest pain in January 2014 and was seen at [facility]; however, no intervention was performed at that time. Recently he had some dyspepsia treated by a PPI which has resolved. He has had a little bit of weight gain and decreased exercise, which he is working on, and some dietary indiscretion. He also had a metabolic test in our office just on August 08, 2012, which showed good exercise effort, peak VO2 of 79% predicted with normal VO2 heart-rate curves indicating only mild deconditioning but no evidence of ischemia.   Since his last office visit he is adopted a vegetarian lifestyle and has managed to lose almost 20 pounds. He continues to exercise and his made great strides as far as his symptoms are concerned. He still has occasional episodes of angina which are nitrate responsive. He is concerned however about taking higher doses of Imdur as he read that it may not be effective in the long term.  He is inquiring about possibly coming off the medication if possible. He is no longer taking little low as he has made dietary changes and wishes to use a natural approach rather than statin medication.  I've reminded him that because he has significant coronary disease and multiple stents that statins are still indicated despite dietary changes.  [name] is doing well today. Denies any chest pain. He is interested in seeing what his cholesterol is coming down to now that he's been on  a vegan and diet. He is not on any cholesterol medications. He also reports some sexual dysfunction and is interested in Viagra.  01/13/2016  [name] returns today for follow-up. He is doing exceedingly well. He continues to exercise. He had switched to a vegan diet but recently had to introduce some dairy. His weight is down another 10 pounds and again he has no anginal symptoms at all. He is interested actually in coming off of Plavix today. It's been more than a year since his stent and since he's been free of angina I think that's very reasonable. Recent cholesterol testing shows marked improvement in his numbers including an LDL of 66 and total cholesterol of 132.  01/04/2017  [name] was seen today in follow-up. He continues to do well. He denies any new chest pain or worsening shortness of breath. He continues exercise regularly. Weight is down further from 175-168 and he is essentially at goal. Blood pressure is well-controlled today 114/82. His EKG is personally reviewed shows sinus bradycardia 50 with no ischemic changes. He recently had lab work showed total cholesterol 195, Trilisate 87, HDL 46 and LDL-C at 132. Of note his LDL-C was 66 last year. When queried about this he reported his insurance, he said that he did not need the medicine anymore and would not cover it. Clearly although he is optimize regarding diet and exercise his cholesterol remains elevated. His goal LDL-C is less than 70 given his coronary artery disease and he will not recheck goal without medication. Unfortunately he  was intolerant to a number of statins in the past including atorvastatin, simvastatin and rosuvastatin. He's also recently been complaining of some peripheral neuropathy. He is not on medication yet for this but reports numbness and tingling in his hands and feet. He would live and A1c however is now better controlled at 5.4.  11/15/2018  [name] returns today for follow-up.  He is complaining of some  persistent chest pain that seems to started after his recent coronary intervention.  In April he was seen in the hospital with recurrent chest pain.  He underwent left heart catheterization was found to have in-stent restenosis of the circumflex and OM branches which was intervened upon using a cutting balloon angioplasty.  After that he had resolution of his symptoms which were mostly with exertion and relieved by rest.  Since then he has had chest discomfort which is somewhat persistent.  It does not seem to be worse with exertion or relieved by rest, for example he was able to ride his bicycle to the office today without any issues, but says that he can have chest discomfort sitting in bed and reading a book.  He was seen in the office by Almyra Deforest, PA-C who placed him on long-acting Imdur.  This did not help his symptoms.  Subsequently he was seen in the ER who uptitrated his Imdur from 30 to 60 mg and increased his beta-blocker.  Although his blood pressure is better and heart rate is lower, his chest pain has not improved.  He describes it as a right-sided chest discomfort which is not worse with taking a deep breath or movement and again is more often at rest.  It does not radiate.  05/07/2020  [name] is seen today in follow-up.  He was treated earlier this year and had an angioplasty to the circumflex/OM 2 for in-stent restenosis which decreased the degree of stenosis from 95 to 30%.  Dual antiplatelet therapy with aspirin and Brilinta was recommended for least a year which will be reevaluated in April.  Ultimately he may remain on aspirin and Plavix.  He was also seen more recently in the emergency department for chest pain thought to be atypical.  I recommended Ranexa to the ER physician however he never started on the medicine.  Since then though he says his symptoms have improved.  He denies any recurrent chest pain.  He is made some dietary modifications.  He still active and rides his bicycle every  day.  Unfortunately he has had some falls due to back problems and has a need for upcoming surgery to his right great toe.  Pressures well controlled today.  Recent lipids showed total cholesterol of 168, HDL 45, LDL 76 and triglycerides 237.  This was also associated with an A1c increase however he has been started on Metformin and is on October 4 A1c is 6.  PMHx:  Past Medical History:  Diagnosis Date  . Allergies   . Arthritis   . Chest pain   . Chronic lower back pain   . Chronic pain of right wrist   . Coronary artery disease    a. Multiple prior caths/PCI. Cath 2013 with possible spasm of RCA, 70% ISR of mid LCx with subsequent DES to mLCx and prox LCX. b. H/o microvascular angina. c. Recurrent angina 08/2014 - s/p PTCA/DES to prox Cx, PTCA/CBA to OM1.  c. LHC 06/10/15 with patent stents and some ISR in LCX and OM-1 that was not flow limiting -->  Rx   . Dyslipidemia    a. Intolerant to many statins except tolerating Livalo.  Marland Kitchen GERD (gastroesophageal reflux disease)   . H/O cardiac catheterization 10/25/2018  . Heart attack ([place])   . Hypertension   . Myocardial infarction ([place]) ~ 2010  . S/P angioplasty with stent, DES, to proximal and mid LCX 12/15/11 12/15/2011  . Shoulder pain   . Stroke [facility])    pt. reports had a stroke around time of MI 2010  . Type II diabetes mellitus ([place])   . Unstable angina [facility])     Past Surgical History:  Procedure Laterality Date  . CARDIAC CATHETERIZATION  06/15/2002   LAD with prox 40% stenosis, norma L main, Cfx with 25% lesion, RCA with long mid 25% stenosis (Dr. Vita Barley)  . CARDIAC CATHETERIZATION  04/01/2010   normal L main, LAD wit mild stenosis, L Cfx with 70% in-stent restenosis, RCA with 70% in-stent restenosis, LVEF >60% (Dr. K. Mali Adriona Kaney) - cutting ballon arthrectomy to RCA & Cfx (Dr. Rockne Menghini)  . CARDIAC CATHETERIZATION  08/25/2010   preserved global LV contractility; multivessel CAD, diffuse 90-95% in-stent restenosis in prox placed  Cfx stent - cutting balloon arthrectomy in Cfx with multiple dilatations 90-95% to 0% stenosis (Dr. Corky Downs)  . CARDIAC CATHETERIZATION  01/26/2011   PCI & stenting of aggresive in-stent restenosis within previously stented AV groove Cfx with 3.0x67mm Taxus DES (previous stents were Promus) (Dr. Adora Fridge)  . CARDIAC CATHETERIZATION  05/11/2011   preserved LV function, 40% mid LAD stenosis, 30-40% narrowing proximal to stented semgnet of prox Cfx, patent mid RCA stent with smooth 20% narrowing in distal RCA (Dr. Corky Downs)  . CARDIAC CATHETERIZATION  12/15/2011   PCI & stenting of proximal & mid Cfx with DES - 3.0x20mm in proximal, 3.0x55mm in mid (Dr. Adora Fridge)  . CARDIAC CATHETERIZATION N/A 06/10/2015   Procedure: Left Heart Cath and Coronary Angiography;  Surgeon: Jolaine Artist, MD;  Location: [place] CV LAB;  Service: Cardiovascular;  Laterality: N/A;  . CARDIAC CATHETERIZATION  10/25/2018  . cardiometabolic testing  6/43/3295   good exercise effort, peak VO2 79% predicted with normal VO2 HR curves (mild deconditioning)  . COLONOSCOPY  12/2012   diminutive hyperplastic sigmoid poyp so repeat routine 2024  . CORONARY BALLOON ANGIOPLASTY N/A 10/25/2018   Procedure: CORONARY BALLOON ANGIOPLASTY;  Surgeon: Jettie Booze, MD;  Location: [place] CV LAB;  Service: Cardiovascular;  Laterality: N/A;  . CORONARY BALLOON ANGIOPLASTY N/A 09/29/2019   Procedure: CORONARY BALLOON ANGIOPLASTY;  Surgeon: Nelva Bush, MD;  Location: [place] CV LAB;  Service: Cardiovascular;  Laterality: N/A;  . EXCISIONAL HEMORRHOIDECTOMY  1984  . INTRAVASCULAR ULTRASOUND/IVUS N/A 09/29/2019   Procedure: Intravascular Ultrasound/IVUS;  Surgeon: Nelva Bush, MD;  Location: [place] CV LAB;  Service: Cardiovascular;  Laterality: N/A;  . LEFT HEART CATH AND CORONARY ANGIOGRAPHY N/A 10/25/2018   Procedure: LEFT HEART CATH AND CORONARY ANGIOGRAPHY;  Surgeon: Jettie Booze, MD;  Location: [place] CV LAB;  Service: Cardiovascular;  Laterality: N/A;  . LEFT HEART CATH AND CORONARY ANGIOGRAPHY N/A 09/29/2019   Procedure: LEFT HEART CATH AND CORONARY ANGIOGRAPHY;  Surgeon: Nelva Bush, MD;  Location: [place] CV LAB;  Service: Cardiovascular;  Laterality: N/A;  . LEFT HEART CATHETERIZATION WITH CORONARY ANGIOGRAM N/A 05/11/2011   Procedure: LEFT HEART CATHETERIZATION WITH CORONARY ANGIOGRAM;  Surgeon: Troy Sine, MD;  Location: [facility] CATH LAB;  Service: Cardiovascular;  Laterality: N/A;  Possible percutaneous  coronary intervention, possible IVUS  . LEFT HEART CATHETERIZATION WITH CORONARY ANGIOGRAM N/A 12/15/2011   Procedure: LEFT HEART CATHETERIZATION WITH CORONARY ANGIOGRAM;  Surgeon: Lorretta Harp, MD;  Location: [facility] CATH LAB;  Service: Cardiovascular;  Laterality: N/A;  . LEFT HEART CATHETERIZATION WITH CORONARY ANGIOGRAM N/A 09/05/2014   Procedure: LEFT HEART CATHETERIZATION WITH CORONARY ANGIOGRAM;  Surgeon: Burnell Blanks, MD;  Location: [facility] CATH LAB;  Service: Cardiovascular;  Laterality: N/A;  . LIPOMA EXCISION     back of the head  . NM MYOCAR PERF WALL MOTION  02/2012   lexiscan myoview; mild perfusion defect in mid inferolateral & basal inferolateral region (infarct/scar); EF 52%, abnormal but ow risk scan  . PERCUTANEOUS CORONARY STENT INTERVENTION (PCI-S)  09/05/2014   Procedure: PERCUTANEOUS CORONARY STENT INTERVENTION (PCI-S);  Surgeon: Burnell Blanks, MD;  Location[facility] C[facility] LAB;  Service: Cardiovascular;;    FAMHx:  Family History  Problem Relation Age of Onset  . Leukemia Mother   . Prostate cancer Father   . Coronary artery disease Paternal Grandmother   . Cancer Paternal Grandfather   . Cancer Brother     SOCHx:   reports that he quit smoking about 19 months ago. His smoking use included cigarettes. He has a 10.00 pack-year smoking history. He has never used smokeless tobacco. He reports current alcohol use. He reports current drug use.  Drug: Cocaine.  ALLERGIES:  Allergies  Allergen Reactions  . Bee Venom Anaphylaxis and Hives  . Shellfish Allergy Anaphylaxis and Hives  . Statins Other (See Comments)    Myalgias. Tolerating livalo. Pain   . Statins   . Testosterone Cypionate     Testerone Injection --Increased breast tissue     ROS: Pertinent items noted in HPI and remainder of comprehensive ROS otherwise negative.  HOME MEDS: Current Outpatient Medications  Medication Sig Dispense Refill  . amLODipine (NORVASC) 10 MG tablet Take 10 mg by mouth daily.    Marland Kitchen aspirin EC 81 MG EC tablet Take 1 tablet (81 mg total) by mouth daily. (May buy from over the counter): Swallow whole for heart health 30 tablet 0  . fluconazole (DIFLUCAN) 150 MG tablet Take 1 tablet weekly X 4 weeks. 4 tablet 0  . metFORMIN (GLUCOPHAGE) 500 MG tablet TAKE 1 TABLET BY MOUTH TWICE DAILY WITH A MEAL 180 tablet 2  . metoprolol tartrate (LOPRESSOR) 100 MG tablet Take 1 tablet (100 mg total) by mouth 2 (two) times daily. 30 tablet 0  . nitroGLYCERIN (NITROSTAT) 0.4 MG SL tablet Place 1 tablet (0.4 mg total) under the tongue every 5 (five) minutes x 3 doses as needed for chest pain. 25 tablet 12  . ticagrelor (BRILINTA) 90 MG TABS tablet Take 1 tablet (90 mg total) by mouth 2 (two) times daily. For cardiac issues 30 tablet 0   No current facility-administered medications for this visit.    LABS/IMAGING: No results found for this or any previous visit (from the past 48 hour(s)). No results found.  VITALS: BP (!) 132/92   Pulse 78   Ht 5\' 8"  (1.727 m)   Wt 189 lb (85.7 kg)   SpO2 98%   BMI 28.74 kg/m   EXAM: General appearance: alert and no distress Neck: no carotid bruit and no JVD Lungs: clear to auscultation bilaterally Heart: regular rate and rhythm, S1, S2 normal, no murmur, click, rub or gallop Abdomen: soft, non-tender; bowel sounds normal; no masses,  no organomegaly Extremities: extremities normal, atraumatic, no cyanosis or  edema  Pulses: 2+ and symmetric Skin: Skin color, texture, turgor normal. No rashes or lesions Neurologic: Grossly normal Psych: Pleasant  EKG: Deferred  ASSESSMENT: 1. Recurrent chest pain -PCI for in-stent restenosis to the circumflex/OM 2 (09/2019) 2. Recent unstable angina status post PTCA to the circumflex and marginal branches for in-stent restenosis 3. Coronary artery disease status post multiple PCI as with the last stent to the circumflex in 2013 4. Dyslipidemia-on Livalo (intolerant to atorvastatin, simvastatin and rosuvastatin) 5. Hypertension 6. 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 Erectile dysfunction  PLAN: 1.   [name] has had recurrent anginal symptoms but more recently has done well.  He had some atypical chest pain and I had recommended Ranexa however he never had that filled.  Since then his symptoms have improved.  He said he has made some dietary changes.  He is now physically active and rides his bicycle every day.  He denies any current angina.  We will reassess coming off dual antiplatelet therapy in April or possibly switching him to aspirin and Plavix.  Follow-up in 6 months.  Pixie Casino, MD, [facility], [place] Director of the Advanced Lipid Disorders &  Cardiovascular Risk Reduction Clinic Diplomate of the American Board of Clinical Lipidology Attending Cardiologist  Direct Dial: [phone]  Fax: [phone]  Website:  [email or web address] Jenin Birdsall 05/07/2020, 10:00 AM

## 2020-05-07 NOTE — Patient Instructions (Signed)

## 2020-05-27 MED ORDER — OXYCODONE-ACETAMINOPHEN 10-325 MG PO TABS: 1.0000 | ORAL_TABLET | ORAL | 0 refills | Status: DC | PRN

## 2020-05-27 MED ORDER — ONDANSETRON HCL 4 MG PO TABS: 4.0000 mg | ORAL_TABLET | Freq: Three times a day (TID) | ORAL | 0 refills | Status: DC | PRN

## 2020-05-27 NOTE — Addendum Note (Signed)
Addended by: Wallene Huh on: 05/27/2020 01:16 PM   Modules accepted: Orders

## 2020-05-28 ENCOUNTER — Encounter: Payer: Self-pay | Admitting: Podiatry

## 2020-05-28 ENCOUNTER — Ambulatory Visit: Payer: Self-pay | Admitting: Allergy & Immunology

## 2020-05-28 DIAGNOSIS — M2011 Hallux valgus (acquired), right foot: Secondary | ICD-10-CM | POA: Diagnosis not present

## 2020-06-03 ENCOUNTER — Other Ambulatory Visit: Payer: Self-pay

## 2020-06-03 ENCOUNTER — Encounter: Payer: Self-pay | Admitting: Podiatry

## 2020-06-03 ENCOUNTER — Ambulatory Visit (INDEPENDENT_AMBULATORY_CARE_PROVIDER_SITE_OTHER): Payer: Medicaid Other | Admitting: Podiatry

## 2020-06-03 ENCOUNTER — Ambulatory Visit (INDEPENDENT_AMBULATORY_CARE_PROVIDER_SITE_OTHER): Payer: Medicaid Other

## 2020-06-03 DIAGNOSIS — M21612 Bunion of left foot: Secondary | ICD-10-CM | POA: Diagnosis not present

## 2020-06-03 DIAGNOSIS — M21611 Bunion of right foot: Secondary | ICD-10-CM

## 2020-06-03 NOTE — Progress Notes (Signed)
Subjective:   Patient ID: [name], male   DOB: 59 y.o.   MRN: [id number]   HPI Patient states doing very well with surgery please minimal discomfort   ROS      Objective:  Physical Exam  Neurovascular status intact negative Bevelyn Buckles' sign noted right foot healing well wound edges well coapted good range of motion no crepitus joint when moved     Assessment:  Doing well post foot surgery right     Plan:  H&P sterile dressing reapplied advised on continued elevation compression immobilization dispensed surgical shoe reappoint 3 weeks or earlier if needed  X-rays indicate that the osteotomy is healing well fixation in place joint congruence

## 2020-06-20 ENCOUNTER — Ambulatory Visit: Payer: Self-pay | Admitting: Allergy & Immunology

## 2020-06-27 ENCOUNTER — Encounter: Payer: Medicaid Other | Admitting: Podiatry

## 2020-08-26 ENCOUNTER — Ambulatory Visit (HOSPITAL_COMMUNITY): Payer: Medicaid Other | Admitting: Licensed Clinical Social Worker

## 2020-08-26 ENCOUNTER — Other Ambulatory Visit: Payer: Self-pay

## 2020-08-26 ENCOUNTER — Encounter (HOSPITAL_COMMUNITY): Payer: Self-pay

## 2020-08-26 NOTE — Progress Notes (Deleted)
Triage Note: Pt presents to [facility] reporting he would like to get back on meds for MDD. Pt states he has been medication & diet-non-compliant recently and he thinks "the real reason"/lack of motivation for non-compliance is related to MDD. Pt stated he has had thoughts he would be okay if he did not wake from sleep. Pt denies plan to harm himself. He denies HI and AVH. Pt was scheduled to see a [place] outpt counselor about 3 hours later. He was told he could wait for the appointment upstairs but he sat in downstairs lobby. At 11:15 pt asked when he would be called for his appointment. This Probation officer brought pt upstairs. Staff stated they tried to find this Probation officer bc therapist went to lunch. No other therapist was available to see pt today. Pt agreeable to return tomorrow at 7:45 am for walk-in appointment.

## 2020-08-27 ENCOUNTER — Ambulatory Visit (INDEPENDENT_AMBULATORY_CARE_PROVIDER_SITE_OTHER): Payer: Medicaid Other | Admitting: Psychiatry

## 2020-08-27 ENCOUNTER — Encounter (HOSPITAL_COMMUNITY): Payer: Self-pay | Admitting: Psychiatry

## 2020-08-27 VITALS — BP 139/95 | HR 82 | Ht 68.0 in | Wt 198.2 lb

## 2020-08-27 DIAGNOSIS — F1024 Alcohol dependence with alcohol-induced mood disorder: Secondary | ICD-10-CM | POA: Diagnosis not present

## 2020-08-27 DIAGNOSIS — F332 Major depressive disorder, recurrent severe without psychotic features: Secondary | ICD-10-CM

## 2020-08-27 MED ORDER — ARIPIPRAZOLE 5 MG PO TABS: 5.0000 mg | ORAL_TABLET | Freq: Every day | ORAL | 2 refills | Status: DC

## 2020-08-27 MED ORDER — BUPROPION HCL ER (XL) 150 MG PO TB24: 150.0000 mg | ORAL_TABLET | ORAL | 2 refills | Status: DC
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
## 2020-08-29 ENCOUNTER — Other Ambulatory Visit: Payer: Self-pay

## 2020-08-29 ENCOUNTER — Encounter: Payer: Self-pay | Admitting: Podiatry

## 2020-08-29 ENCOUNTER — Ambulatory Visit (INDEPENDENT_AMBULATORY_CARE_PROVIDER_SITE_OTHER): Payer: Medicaid Other

## 2020-08-29 ENCOUNTER — Ambulatory Visit (INDEPENDENT_AMBULATORY_CARE_PROVIDER_SITE_OTHER): Payer: Medicaid Other | Admitting: Behavioral Health

## 2020-08-29 ENCOUNTER — Ambulatory Visit (INDEPENDENT_AMBULATORY_CARE_PROVIDER_SITE_OTHER): Payer: Medicaid Other | Admitting: Podiatry

## 2020-08-29 DIAGNOSIS — F1024 Alcohol dependence with alcohol-induced mood disorder: Secondary | ICD-10-CM | POA: Diagnosis not present

## 2020-08-29 DIAGNOSIS — M21611 Bunion of right foot: Secondary | ICD-10-CM

## 2020-08-29 DIAGNOSIS — M21612 Bunion of left foot: Secondary | ICD-10-CM

## 2020-08-29 NOTE — Progress Notes (Signed)
Subjective:   Patient ID: [name], male   DOB: 60 y.o.   MRN: [id number]   HPI Patient presents stating he is doing well just wanted to get his foot checked with mild swelling   ROS      Objective:  Physical Exam  Neurovascular status intact with patient's right bunion healing very well wound edges well coapted hallux rectus position     Assessment:  Overall doing fine mild swelling still noted     Plan:  Final x-ray dated today and I went ahead and I discussed and recommended that he return to normal activities but total recovery still will take another probably 4 to 6 months.  Patient will be seen back as needed  X-rays indicate osteotomies healing well fixation in place good alignment noted

## 2020-08-29 NOTE — Progress Notes (Signed)
Comprehensive Clinical Assessment (CCA) Note  08/29/2020 [name] [id number]  Loura Pardon. Krempasky is a 60 year old male who presents to [facility] as a walk-in for a Comprehensive Clinical Assessment. Client was referred to [facility] after completing an inpatient hospitalization - September 2021. Client reports his reason for seeking treatment today is to enroll in [place].   Client reports having episodes of increased depressive symptoms. Client reports having the following symptoms of depression: feelings of hopelessness, feelings of worthlessness, anhedonia, tearfulness, and decreased appetite. Client reports alcohol to be his substance of choice and date of last use to be: 07/30/2020. Clt reports he was diagnosed with heart disease 4-5 years ago.  During evaluation client is alert/oriented x 4; calm/cooperative; and mood is appropriate and congruent with affect. Client does not appear to be responding to internal/external stimuli or delusional thoughts. Client denies suicidal/self-harm/homicidal ideation, psychosis, and paranoia. Client answered question appropriately.  Recommendation: Medication Management / Substance Abuse Intensive Outpatient Program   Malnutrition Score: 1  Pain Score: 4  PHQ-2 Total Score: 22   C-SSRS Risk Category: High Risk   Chief Complaint:  Chief Complaint  Patient presents with  . Alcohol Problem   Visit Diagnosis: Alcohol Use Disorder, severe    CCA Screening, Triage and Referral (STR)  Patient Reported Information How did you hear about [place]? No data recorded Referral name: No data recorded Referral phone number: No data recorded  Whom do you see for routine medical problems? No data recorded Practice/Facility Name: No data recorded Practice/Facility Phone Number: No data recorded Name of Contact: No data recorded Contact Number: No data recorded Contact Fax Number: No data recorded Prescriber Name: No data recorded Prescriber Address (if known): No data  recorded  What Is the Reason for Your Visit/Call Today? No data recorded How Long Has This Been Causing You Problems? No data recorded What Do You Feel Would Help You the Most Today? No data recorded  Have You Recently Been in Any Inpatient Treatment (Hospital/Detox/Crisis Center/28-Day Program)? No data recorded Name/Location of Program/Hospital:No data recorded How Long Were You There? No data recorded When Were You Discharged? No data recorded  Have You Ever Received Services From [facility] Before? No data recorded Who Do You See at [facility]? No data recorded  Have You Recently Had Any Thoughts About Hurting Yourself? No data recorded Are You Planning to Commit Suicide/Harm Yourself At This time? No data recorded  Have you Recently Had Thoughts About [place]? No data recorded Explanation: No data recorded  Have You Used Any Alcohol or Drugs in the Past 24 Hours? No data recorded How Long Ago Did You Use Drugs or Alcohol? No data recorded What Did You Use and How Much? No data recorded  Do You Currently Have a Therapist/Psychiatrist? No data recorded Name of Therapist/Psychiatrist: No data recorded  Have You Been Recently Discharged From Any Office Practice or Programs? No data recorded Explanation of Discharge From Practice/Program: No data recorded    CCA Screening Triage Referral Assessment Type of Contact: No data recorded Is this Initial or Reassessment? No data recorded Date Telepsych consult ordered in [facility]:  No data recorded Time Telepsych consult ordered in [facility]:  No data recorded  Patient Reported Information Reviewed? No data recorded Patient Left Without Being Seen? No data recorded Reason for Not Completing Assessment: No data recorded  Collateral Involvement: No data recorded  Does Patient Have a [place]? No data recorded Name and Contact of Legal Guardian: -- (  Self)  If Minor and Not Living with Parent(s), Who has  Custody? -- (N/A)  Is CPS involved or ever been involved? Never  Is APS involved or ever been involved? Never   Patient Determined To Be At Risk for Harm To Self or Others Based on Review of Patient Reported Information or Presenting Complaint? No data recorded Method: No data recorded Availability of Means: No data recorded Intent: No data recorded Notification Required: No data recorded Additional Information for Danger to Others Potential: No data recorded Additional Comments for Danger to Others Potential: No data recorded Are There Guns or Other Weapons in Your Home? No data recorded Types of Guns/Weapons: No data recorded Are These Weapons Safely Secured?                            No data recorded Who Could Verify You Are Able To Have These Secured: No data recorded Do You Have any Outstanding Charges, Pending Court Dates, Parole/Probation? No data recorded Contacted To Inform of Risk of Harm To Self or Others: No data recorded  Location of Assessment: [facility] Assessment Services   Does Patient Present under Involuntary Commitment? No data recorded IVC Papers Initial File Date: No data recorded  [place] of Residence: No data recorded  Patient Currently Receiving the Following Services: No data recorded  Determination of Need: No data recorded  Options For Referral: No data recorded    CCA Biopsychosocial Intake/Chief Complaint:  Alcohol Use  Current Symptoms/Problems: No data recorded  Patient Reported Schizophrenia/Schizoaffective Diagnosis in Past: No data recorded  Strengths: No data recorded Preferences: No data recorded Abilities: No data recorded  Type of Services Patient Feels are Needed: No data recorded  Initial Clinical Notes/Concerns: No data recorded  Mental Health Symptoms Depression:  Hopelessness; Worthlessness; Increase/decrease in appetite; Tearfulness; Weight gain/loss; Sleep (too much or little); Change in energy/activity; Difficulty  Concentrating; Fatigue ("Existing and not living.")   Duration of Depressive symptoms: Greater than two weeks   Mania:  None   Anxiety:   Worrying; Irritability; Restlessness   Psychosis:  None   Duration of Psychotic symptoms: No data recorded  Trauma:  None   Obsessions:  None   Compulsions:  None   Inattention:  None   Hyperactivity/Impulsivity:  N/A   Oppositional/Defiant Behaviors:  None   Emotional Irregularity:  None   Other Mood/Personality Symptoms:  None    Mental Status Exam Appearance and self-care  Stature:  Average   Weight:  Average weight   Clothing:  Neat/clean; Age-appropriate   Grooming:  Well-groomed   Cosmetic use:  None   Posture/gait:  Normal   Motor activity:  Not Remarkable   Sensorium  Attention:  Normal   Concentration:  Normal   Orientation:  X5   Recall/memory:  Normal   Affect and Mood  Affect:  Appropriate   Mood:  Other (Comment) (Pleasant)   Relating  Eye contact:  Normal   Facial expression:  Responsive   Attitude toward examiner:  Cooperative   Thought and Language  Speech flow: Clear and Coherent   Thought content:  Appropriate to Mood and Circumstances   Preoccupation:  None   Hallucinations:  None   Organization:  No data recorded  [employer] of Knowledge:  Average   Intelligence:  Average   Abstraction:  Normal   Judgement:  Normal   Reality Testing:  Adequate   Insight:  Good   Decision  Making:  Normal   Social Functioning  Social Maturity:  No data recorded  Social Judgement:  Normal   Stress  Stressors:  Grief/losses; Family conflict; Financial; Other (Comment) (Addiction)   Coping Ability:  Resilient   Skill Deficits:  None   Supports:  Friends/Service system; Family; Other (Comment) ([facility])     Religion: Religion/Spirituality Are You A Religious Person?: Yes What is Your Religious Affiliation?: Christian How Might This Affect Treatment?:  None  Leisure/Recreation: Leisure / Recreation Do You Have Hobbies?: Yes Leisure and Hobbies: Musician, ride mountain bikes, groom animals  Exercise/Diet: Exercise/Diet Do You Exercise?: Yes What Type of Exercise Do You Do?: Bike,Weight Training How Many Times a Week Do You Exercise?: Daily Have You Gained or Lost A Significant Amount of Weight in the Past Six Months?: Yes-Lost Number of Pounds Lost?: 10 Do You Follow a Special Diet?: Yes Type of Diet: Vegetarian Do You Have Any Trouble Sleeping?: No   CCA Employment/Education Employment/Work Situation: Employment / Work Situation Employment situation: On disability Why is patient on disability: Heart condition How long has patient been on disability: 8 years Patient's job has been impacted by current illness: No What is the longest time patient has a held a job?: 12 years Where was the patient employed at that time?: [employer] Has patient ever been in the [employer]?: No  Education: Education Is Patient Currently Attending School?: No Last Grade Completed: 14 Name of [place]: [place] Did Teacher, adult education From [employer]?: Yes Did Physicist, medical?: No Did Heritage manager?: No Did You Have An Individualized Education Program (IIEP): No Did You Have Any Difficulty At [employer]?: No Patient's Education Has Been Impacted by Current Illness: No   CCA Family/Childhood History Family and Relationship History: Family history Marital status: Widowed Widowed, when?: October 10, 2018 Are you sexually active?: No What is your sexual orientation?: Heterosexual Has your sexual activity been affected by drugs, alcohol, medication, or emotional stress?: N/A Does patient have children?: Yes How many children?: 2 (Son - 48yo & Daughter - 73yo) How is patient's relationship with their children?: "Great .. I just don't see them enough anymore. Both children live out of state."  Childhood History:  Childhood  History By whom was/is the patient raised?: Both parents Additional childhood history information: OK childhood: "depressing and dysfunctional"  Very little communication and they were not close. Description of patient's relationship with caregiver when they were a child: Good relationship but dysfunctional with alcoholism and depression. Alcohol use - oldest brother oldest sister, youngest brother. Patient's description of current relationship with people who raised him/her: Both parents are deceased Oct 09, 2017 - father & 10/10/2018 - mother ... Both passed away of cancer. How were you disciplined when you got in trouble as a child/adolescent?: Spanked Does patient have siblings?: Yes Number of Siblings: 6 Description of patient's current relationship with siblings: "Close relationship" Did patient suffer any verbal/emotional/physical/sexual abuse as a child?: No Did patient suffer from severe childhood neglect?: No Has patient ever been sexually abused/assaulted/raped as an adolescent or adult?: No Was the patient ever a victim of a crime or a disaster?: No Witnessed domestic violence?: No Has patient been affected by domestic violence as an adult?: No  Child/Adolescent Assessment: N/A     CCA Substance Use Alcohol/Drug Use: Alcohol / Drug Use Pain Medications: See MAR Prescriptions: See MAR Over the Counter: See MAR History of alcohol / drug use?: Yes Longest period of sobriety (when/how long): 4 years and  1 month Negative Consequences of Use: Financial,Personal relationships Withdrawal Symptoms: Blackouts Substance #1 Name of Substance 1: Alcohol 1 - Age of First Use: 22 1 - Amount (size/oz): "binge" 1 - Frequency: Binges 1 - Duration: 34 years 1 - Last Use / Amount: 07/30/2020 1 - Method of Aquiring: Income 1- Route of Use: Drinking     ASAM's:  Six Dimensions of Multidimensional Assessment  Dimension 1:  Acute Intoxication and/or Withdrawal Potential:   Dimension 1:  Description  of individual's past and current experiences of substance use and withdrawal: Blackouts  Dimension 2:  Biomedical Conditions and Complications:   Dimension 2:  Description of patient's biomedical conditions and  complications: Heart disease  Dimension 3:  Emotional, Behavioral, or Cognitive Conditions and Complications:  Dimension 3:  Description of emotional, behavioral, or cognitive conditions and complications: Major Depressive Disorder  Dimension 4:  Readiness to Change:  Dimension 4:  Description of Readiness to Change criteria: Action Stage of Change  Dimension 5:  Relapse, Continued use, or Continued Problem Potential:  Dimension 5:  Relapse, continued use, or continued problem potential critiera description: High Risk for Relapse  Dimension 6:  Recovery/Living Environment:  Dimension 6:  Recovery/Iiving environment criteria description: Pt reports living in a supportive recovery environment  ASAM Severity Score: ASAM's Severity Rating Score: 13  ASAM Recommended Level of Treatment:     Substance use Disorder (SUD) Substance Use Disorder (SUD)  Checklist Symptoms of Substance Use: Continued use despite having a persistent/recurrent physical/psychological problem caused/exacerbated by use,Continued use despite persistent or recurrent social, interpersonal problems, caused or exacerbated by use,Evidence of tolerance,Large amounts of time spent to obtain, use or recover from the substance(s),Persistent desire or unsuccessful efforts to cut down or control use,Recurrent use that results in a failure to fulfill major role obligations (work, school, home),Presence of craving or strong urge to use,Evidence of withdrawal (Comment),Social, occupational, recreational activities given up or reduced due to use,Repeated use in physically hazardous situations,Substance(s) often taken in larger amounts or over longer times than was intended  Recommendations for Services/Supports/Treatments: Recommendations  for Services/Supports/Treatments Recommendations For Services/Supports/Treatments: Medication Management,SAIOP (Substance Abuse Intensive Outpatient Program)  DSM5 Diagnoses: Patient Active Problem List   Diagnosis Date Noted  . Alcohol dependence with alcohol-induced mood disorder ([place]) 03/19/2020  . MDD (major depressive disorder), recurrent episode, severe ([place]) 03/18/2020  . History of substance abuse ([place]) 01/04/2020  . Severe 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 features ([place]) 12/10/2019  . Tinea cruris 07/06/2019  . Hemoglobin A1c less than 7.0% 07/06/2019  . Muscle strain of wrist 04/05/2019  . Pain in both wrists 04/05/2019  . Anxiety 04/05/2019  . Prediabetes 04/05/2019  . MDD (major depressive disorder) 01/12/2019  . Chest pain 10/25/2018  . MDD (major depressive disorder), recurrent severe, without psychosis ([place]) 08/15/2018  . Influenza A 07/17/2017  . Lactic acidosis   . Cough   . Nausea and vomiting   . Severe episode of recurrent major depressive disorder, without psychotic features ([place])   . MDD (major depressive disorder), single episode, severe with psychotic features ([place]) 06/27/2017  . MDD (major depressive disorder), severe ([place]) 06/23/2017  . Essential hypertension 01/13/2016  . Unstable angina ([place])   . ED (erectile dysfunction) 02/15/2014  . Atypical chest pain 12/14/2011  . Allergy to contrast media (used for diagnostic x-rays) 05/12/2011  . Dyslipidemia, goal LDL below 70 05/09/2011  . DM2 (diabetes mellitus, type 2) ([place]) 07/10/2010  . CAD S/P percutaneous coronary angioplasty 07/10/2010    Patient Centered Plan:  Patient is on the following Treatment Plan(s):  Substance Abuse   Allyne Gee, Counselor

## 2020-09-09 ENCOUNTER — Other Ambulatory Visit: Payer: Self-pay

## 2020-09-09 ENCOUNTER — Encounter: Payer: Self-pay | Admitting: Allergy

## 2020-09-09 ENCOUNTER — Other Ambulatory Visit: Payer: Self-pay | Admitting: Family Medicine

## 2020-09-09 ENCOUNTER — Ambulatory Visit (INDEPENDENT_AMBULATORY_CARE_PROVIDER_SITE_OTHER): Payer: Medicaid Other | Admitting: Allergy

## 2020-09-09 VITALS — BP 126/88 | HR 61 | Temp 97.8°F | Resp 18 | Ht 68.0 in | Wt 197.2 lb

## 2020-09-09 DIAGNOSIS — J3089 Other allergic rhinitis: Secondary | ICD-10-CM | POA: Insufficient documentation

## 2020-09-09 DIAGNOSIS — T781XXD Other adverse food reactions, not elsewhere classified, subsequent encounter: Secondary | ICD-10-CM | POA: Diagnosis not present

## 2020-09-09 DIAGNOSIS — N529 Male erectile dysfunction, unspecified: Secondary | ICD-10-CM

## 2020-09-09 DIAGNOSIS — Z91030 Bee allergy status: Secondary | ICD-10-CM

## 2020-09-09 DIAGNOSIS — T7819XD Other adverse food reactions, not elsewhere classified, subsequent encounter: Secondary | ICD-10-CM | POA: Insufficient documentation

## 2020-09-09 MED ORDER — EPINEPHRINE 0.3 MG/0.3ML IJ SOAJ: 0.3000 mg | INTRAMUSCULAR | 2 refills | Status: DC | PRN

## 2020-09-09 MED ORDER — FLUTICASONE PROPIONATE 50 MCG/ACT NA SUSP: 1.0000 | Freq: Two times a day (BID) | NASAL | 5 refills | Status: DC | PRN

## 2020-09-09 NOTE — Patient Instructions (Addendum)
Today's skin testing showed: Positive to grass, weed pollen, dust mites, cat, cockroach, ragweed, tree pollen. Borderline to dog.   Positive to shellfish and mollusks.  Environmental allergies  Start environmental control measures as below.  May use over the counter antihistamines such as Zyrtec (cetirizine), Claritin (loratadine), Allegra (fexofenadine), or Xyzal (levocetirizine) daily as needed.  May use Flonase (fluticasone) nasal spray 1 spray per nostril twice a day as needed for nasal congestion.   Nasal saline spray (i.e., Simply Saline) or nasal saline lavage (i.e., NeilMed) is recommended as needed and prior to medicated nasal sprays.  Read about allergy injections - handout given.   Food allergies  Continue strict avoidance of shellfish and mollusks.   I have prescribed epinephrine injectable and demonstrated proper use. For mild symptoms you can take over the counter antihistamines such as Benadryl and monitor symptoms closely. If symptoms worsen or if you have severe symptoms including breathing issues, throat closure, significant swelling, whole body hives, severe diarrhea and vomiting, lightheadedness then inject epinephrine and seek immediate medical care afterwards.  Food action plan given.  Wine  Avoid/limit wine consumption.   Dairy:  Milk can thicken mucous. Limit intake before bed.  Bee stings:  Avoid bee stings.  Follow up in 3 months or sooner if needed.   Reducing Pollen Exposure . Pollen seasons: trees (spring), grass (summer) and ragweed/weeds (fall). Marland Kitchen Keep windows closed in your home and car to lower pollen exposure.  Susa Simmonds air conditioning in the bedroom and throughout the house if possible.  . Avoid going out in dry windy days - especially early morning. . Pollen counts are highest between 5 - 10 AM and on dry, hot and windy days.  . Save outside activities for late afternoon or after a heavy rain, when pollen levels are lower.  . Avoid  mowing of grass if you have grass pollen allergy. Marland Kitchen Be aware that pollen can also be transported indoors on people and pets.  . Dry your clothes in an automatic dryer rather than hanging them outside where they might collect pollen.  . Rinse hair and eyes before bedtime. Control of House Dust Mite Allergen . Dust mite allergens are a common trigger of allergy and asthma symptoms. While they can be found throughout the house, these microscopic creatures thrive in warm, humid environments such as bedding, upholstered furniture and carpeting. . Because so much time is spent in the bedroom, it is essential to reduce mite levels there.  . Encase pillows, mattresses, and box springs in special allergen-proof fabric covers or airtight, zippered plastic covers.  . Bedding should be washed weekly in hot water (130 F) and dried in a hot dryer. Allergen-proof covers are available for comforters and pillows that can't be regularly washed.  Wendee Copp the allergy-proof covers every few months. Minimize clutter in the bedroom. Keep pets out of the bedroom.  Marland Kitchen Keep humidity less than 50% by using a dehumidifier or air conditioning. You can buy a humidity measuring device called a hygrometer to monitor this.  . If possible, replace carpets with hardwood, linoleum, or washable area rugs. If that's not possible, vacuum frequently with a vacuum that has a HEPA filter. . Remove all upholstered furniture and non-washable window drapes from the bedroom. . Remove all non-washable stuffed toys from the bedroom.  Wash stuffed toys weekly. Cockroach Allergen Avoidance Cockroaches are often found in the homes of densely populated urban areas, schools or commercial buildings, but these creatures can lurk almost anywhere.  This does not mean that you have a dirty house or living area. . Block all areas where roaches can enter the home. This includes crevices, wall cracks and windows.  . Cockroaches need water to survive, so fix and  seal all leaky faucets and pipes. Have an exterminator go through the house when your family and pets are gone to eliminate any remaining roaches. Marland Kitchen Keep food in lidded containers and put pet food dishes away after your pets are done eating. Vacuum and sweep the floor after meals, and take out garbage and recyclables. Use lidded garbage containers in the kitchen. Wash dishes immediately after use and clean under stoves, refrigerators or toasters where crumbs can accumulate. Wipe off the stove and other kitchen surfaces and cupboards regularly. Pet Allergen Avoidance: . Contrary to popular opinion, there are no "hypoallergenic" breeds of dogs or cats. That is because people are not allergic to an animal's hair, but to an allergen found in the animal's saliva, dander (dead skin flakes) or urine. Pet allergy symptoms typically occur within minutes. For some people, symptoms can build up and become most severe 8 to 12 hours after contact with the animal. People with severe allergies can experience reactions in public places if dander has been transported on the pet owners' clothing. Marland Kitchen Keeping an animal outdoors is only a partial solution, since homes with pets in the yard still have higher concentrations of animal allergens. . Before getting a pet, ask your allergist to determine if you are allergic to animals. If your pet is already considered part of your family, try to minimize contact and keep the pet out of the bedroom and other rooms where you spend a great deal of time. . As with dust mites, vacuum carpets often or replace carpet with a hardwood floor, tile or linoleum. . High-efficiency particulate air (HEPA) cleaners can reduce allergen levels over time. . While dander and saliva are the source of cat and dog allergens, urine is the source of allergens from rabbits, hamsters, mice and [place] pigs; so ask a non-allergic family member to clean the animal's cage. . If you have a pet allergy, talk to your  allergist about the potential for allergy immunotherapy (allergy shots). This strategy can often provide long-term relief.

## 2020-09-09 NOTE — Progress Notes (Signed)
New Patient Note  RE: [name] MRN: [id number] DOB: 03-24-1961 Date of Office Visit: 09/09/2020  Referring provider: Azzie Glatter, FNP Primary care provider: Azzie Glatter, FNP  Chief Complaint: Allergy Testing  History of Present Illness: I had the pleasure of seeing [name] for initial evaluation at the [facility] on 09/09/2020. He is a 60 y.o. male, who is referred here by Azzie Glatter, FNP for the evaluation of allergic rhinitis and food allergies.  Allergic rhinitis: He reports symptoms of PND, rhinorrhea. Symptoms have been going on for a few years. The symptoms are present all year around. Other triggers include exposure to possibly wine - causes increased mucous production but no other symptoms. Anosmia: no. Headache: no. He has used Flonase 1 spray per nostril prn with no improvement in symptoms. Sinus infections: no. Previous work up includes: none. Previous ENT evaluation: no. Previous sinus imaging: no. History of nasal polyps: no. History of reflux: no.  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 The reaction occurred 10+ years ago, after he ate crab legs. Symptoms started within minutes and was in the form of facial swelling, rash on neck. Denies any abdominal pain, diarrhea, vomiting.   The symptoms lasted for a few hours without any medication. He was not evaluated in ED.  Since this episode, he does not report other accidental exposures to shellfish. He does not have access to epinephrine autoinjector and not needed to use it.   Past work up includes: none.  Dietary History: patient has been eating other foods including milk, eggs, peanut, treenuts, fish, soy, wheat, meats, fruits and vegetables. Limited sesame, soy exposure.   He reports reading labels and avoiding shellfish in diet completely.   Assessment and Plan: [name] is a 60 y.o. male with: Other allergic rhinitis Perennial rhinitis symptoms for a few years.   Drinking wine makes it worse.  Tried Flonase as needed with minimal benefit.  No prior allergy/ENT evaluation.  Today's skin testing showed: Positive to grass, weed pollen, dust mites, cat, cockroach, ragweed, tree pollen. Borderline to dog.    Start environmental control measures as below.  May use over the counter antihistamines such as Zyrtec (cetirizine), Claritin (loratadine), Allegra (fexofenadine), or Xyzal (levocetirizine) daily as needed.  May use Flonase (fluticasone) nasal spray 1 spray per nostril twice a day as needed for nasal congestion.   Nasal saline spray (i.e., Simply Saline) or nasal saline lavage (i.e., NeilMed) is recommended as needed and prior to medicated nasal sprays.  Read about allergy injections - handout given.   Adverse reaction to food, subsequent encounter Reaction to crab legs over 10 years ago in the form of facial swelling and rash.  No prior work-up.  Tolerates finned fish with no issues.  Today's skin testing showed: Positive to shellfish and mollusks.  Continue strict avoidance of shellfish and mollusks.   I have prescribed epinephrine injectable and demonstrated proper use. For mild symptoms you can take over the counter antihistamines such as Benadryl and monitor symptoms closely. If symptoms worsen or if you have severe symptoms including breathing issues, throat closure, significant swelling, whole body hives, severe diarrhea and vomiting, lightheadedness then inject epinephrine and seek immediate medical care afterwards.  Food action plan given.  Regarding the wine - he may have some sensitivity to the sulfites in the wine which there is no available testing for.   Avoid/limit wine consumption.   Milk can thicken mucous. Limit intake before bed.  History of bee sting allergy Facial swelling in the past. No prior work up.  Declines bloodwork.  Continue to avoid.  For mild symptoms you can take over the counter antihistamines such as  Benadryl and monitor symptoms closely. If symptoms worsen or if you have severe symptoms including breathing issues, throat closure, significant swelling, whole body hives, severe diarrhea and vomiting, lightheadedness then inject epinephrine and seek immediate medical care afterwards.  Return in about 3 months (around 12/10/2020).  Meds ordered this encounter  Medications  . fluticasone (FLONASE) 50 MCG/ACT nasal spray    Sig: Place 1 spray into both nostrils 2 (two) times daily as needed.    Dispense:  16 g    Refill:  5  . EPINEPHrine 0.3 mg/0.3 mL IJ SOAJ injection    Sig: Inject 0.3 mg into the muscle as needed for anaphylaxis.    Dispense:  1 each    Refill:  2    May dispense generic/Mylan/Teva brand.   Lab Orders  No laboratory test(s) ordered today    Other allergy screening: Asthma: no Medication allergy: yes Hymenoptera allergy: yes  Facial swelling - no prior work up.  Urticaria: no Eczema:no History of recurrent infections suggestive of immunodeficency: no  Diagnostics: Skin Testing: Environmental allergy panel and select foods. Positive to grass, weed pollen, dust mites, cat, cockroach, ragweed, tree pollen. Borderline to dog.   Positive to shellfish and mollusks. Results discussed with patient/family.  Airborne Adult Perc - 09/09/20 0857    Time Antigen Placed 0857    Allergen Manufacturer Lavella Hammock    Location Back    Number of Test 59    1. Control-Buffer 50% Glycerol Negative    2. Control-Histamine 1 mg/ml 2+    3. Albumin saline Negative    4. Boyd 4+    5. [place] 4+    6. Johnson 4+    7. Kentucky Blue 3+    8. Meadow Fescue 3+    9. Perennial Rye 2+    10. Sweet Vernal 2+    11. Timothy 4+    12. Cocklebur 2+    13. Burweed Marshelder Negative    14. Ragweed, short Negative    15. Ragweed, Giant Negative    16. Plantain,  English 2+    17. Lamb's Quarters Negative    18. Sheep Sorrell 2+    19. Rough Pigweed Negative    20. Marsh Elder,  Rough Negative    21. Mugwort, Common Negative    22. Ash mix Negative    23. Birch mix Negative    24. Beech American Negative    25. Box, Elder Negative    26. Cedar, red Negative    27. Cottonwood, [place] Negative    28. Elm mix Negative    29. Hickory Negative    30. Maple mix Negative    31. Oak, [place] mix Negative    32. Pecan Pollen Negative    33. Pine mix Negative    34. Sycamore Eastern Negative    35. [place], Black Pollen Negative    36. Alternaria alternata Negative    37. Cladosporium Herbarum Negative    38. Aspergillus mix Negative    39. Penicillium mix Negative    40. Bipolaris sorokiniana (Helminthosporium) Negative    41. Drechslera spicifera (Curvularia) Negative    42. Mucor plumbeus Negative    43. Fusarium moniliforme Negative    44. Aureobasidium pullulans (pullulara) Negative    45. Rhizopus oryzae Negative  46. Botrytis cinera Negative    47. Epicoccum nigrum Negative    48. Phoma betae Negative    49. Candida Albicans Negative    50. Trichophyton mentagrophytes Negative    51. Mite, D Farinae  5,000 AU/ml Negative    52. Mite, D Pteronyssinus  5,000 AU/ml 3+    53. Cat Hair 10,000 BAU/ml 3+    54.  Dog Epithelia Negative    55. Mixed Feathers Negative    56. Horse Epithelia Negative    57. Cockroach, German 3+    58. Mouse Negative    59. Tobacco Leaf Negative          Intradermal - 09/09/20 0933    Time Antigen Placed 1601    Allergen Manufacturer Lavella Hammock    Location Arm    Number of Test 8    Control Negative    Ragweed mix 4+    Tree mix 2+    Mold 1 Negative    Mold 2 Negative    Mold 3 Negative    Mold 4 Negative    Dog --   +/-         Food Adult Perc - 09/09/20 0800    Time Antigen Placed 0932    Allergen Manufacturer Lavella Hammock    Location Back    Number of allergen test 7    8. Shellfish Mix --   14x5   25. Shrimp --   9x5   26. Crab --   10x8   27. Lobster --   9x8   28. Oyster --   7x3   29. Scallops --   4x3    36. Saccharomyces Cerevisiae  Negative           Past Medical History: Patient Active Problem List   Diagnosis Date Noted  . Other allergic rhinitis 09/09/2020  . Adverse reaction to food, subsequent encounter 09/09/2020  . History of bee sting allergy 09/09/2020  . Alcohol dependence with alcohol-induced mood disorder ([place]) 03/19/2020  . MDD (major depressive disorder), recurrent episode, severe ([place]) 03/18/2020  . History of substance abuse ([place]) 01/04/2020  . Severe 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 features ([place]) 12/10/2019  . Tinea cruris 07/06/2019  . Hemoglobin A1c less than 7.0% 07/06/2019  . Muscle strain of wrist 04/05/2019  . Pain in both wrists 04/05/2019  . Anxiety 04/05/2019  . Prediabetes 04/05/2019  . MDD (major depressive disorder) 01/12/2019  . Chest pain 10/25/2018  . MDD (major depressive disorder), recurrent severe, without psychosis ([place]) 08/15/2018  . Influenza A 07/17/2017  . Lactic acidosis   . Cough   . Nausea and vomiting   . Severe episode of recurrent major depressive disorder, without psychotic features ([place])   . MDD (major depressive disorder), single episode, severe with psychotic features ([place]) 06/27/2017  . MDD (major depressive disorder), severe ([place]) 06/23/2017  . Essential hypertension 01/13/2016  . Unstable angina ([place])   . ED (erectile dysfunction) 02/15/2014  . Atypical chest pain 12/14/2011  . Allergy to contrast media (used for diagnostic x-rays) 05/12/2011  . Dyslipidemia, goal LDL below 70 05/09/2011  . DM2 (diabetes mellitus, type 2) ([place]) 07/10/2010  . CAD S/P percutaneous coronary angioplasty 07/10/2010   Past Medical History:  Diagnosis Date  . Allergies   . Arthritis   . Chest pain   . Chronic lower back pain   . Chronic pain of right wrist   . Coronary artery disease    a. Multiple prior caths/PCI. Cath 2013  with possible spasm of RCA, 70% ISR of mid LCx with subsequent DES to mLCx and prox LCX. b. H/o  microvascular angina. c. Recurrent angina 08/2014 - s/p PTCA/DES to prox Cx, PTCA/CBA to OM1.  c. LHC 06/10/15 with patent stents and some ISR in LCX and OM-1 that was not flow limiting --> Rx   . Dyslipidemia    a. Intolerant to many statins except tolerating Livalo.  Marland Kitchen GERD (gastroesophageal reflux disease)   . H/O cardiac catheterization 10/25/2018  . Heart attack ([place])   . Hypertension   . Myocardial infarction ([place]) ~ 2010  . S/P angioplasty with stent, DES, to proximal and mid LCX 12/15/11 12/15/2011  . Shoulder pain   . Stroke [facility])    pt. reports had a stroke around time of MI 2010  . Type II diabetes mellitus ([place])   . Unstable angina [facility])    Past Surgical History: Past Surgical History:  Procedure Laterality Date  . CARDIAC CATHETERIZATION  06/15/2002   LAD with prox 40% stenosis, norma L main, Cfx with 25% lesion, RCA with long mid 25% stenosis (Dr. Vita Barley)  . CARDIAC CATHETERIZATION  04/01/2010   normal L main, LAD wit mild stenosis, L Cfx with 70% in-stent restenosis, RCA with 70% in-stent restenosis, LVEF >60% (Dr. K. Mali Hilty) - cutting ballon arthrectomy to RCA & Cfx (Dr. Rockne Menghini)  . CARDIAC CATHETERIZATION  08/25/2010   preserved global LV contractility; multivessel CAD, diffuse 90-95% in-stent restenosis in prox placed Cfx stent - cutting balloon arthrectomy in Cfx with multiple dilatations 90-95% to 0% stenosis (Dr. Corky Downs)  . CARDIAC CATHETERIZATION  01/26/2011   PCI & stenting of aggresive in-stent restenosis within previously stented AV groove Cfx with 3.0x105mm Taxus DES (previous stents were Promus) (Dr. Adora Fridge)  . CARDIAC CATHETERIZATION  05/11/2011   preserved LV function, 40% mid LAD stenosis, 30-40% narrowing proximal to stented semgnet of prox Cfx, patent mid RCA stent with smooth 20% narrowing in distal RCA (Dr. Corky Downs)  . CARDIAC CATHETERIZATION  12/15/2011   PCI & stenting of proximal & mid Cfx with DES - 3.0x63mm in proximal, 3.0x38mm in mid (Dr.  Adora Fridge)  . CARDIAC CATHETERIZATION N/A 06/10/2015   Procedure: Left Heart Cath and Coronary Angiography;  Surgeon: Jolaine Artist, MD;  Location: [place] [facility];  Service: Cardiovascular;  Laterality: N/A;  . CARDIAC CATHETERIZATION  10/25/2018  . cardiometabolic testing  10/15/6220   good exercise effort, peak VO2 79% predicted with normal VO2 HR curves (mild deconditioning)  . COLONOSCOPY  12/2012   diminutive hyperplastic sigmoid poyp so repeat routine 2024  . CORONARY BALLOON ANGIOPLASTY N/A 10/25/2018   Procedure: CORONARY BALLOON ANGIOPLASTY;  Surgeon: Jettie Booze, MD;  Location: [place] [facility];  Service: Cardiovascular;  Laterality: N/A;  . CORONARY BALLOON ANGIOPLASTY N/A 09/29/2019   Procedure: CORONARY BALLOON ANGIOPLASTY;  Surgeon: Nelva Bush, MD;  Location: [place] [facility];  Service: Cardiovascular;  Laterality: N/A;  . EXCISIONAL HEMORRHOIDECTOMY  1984  . INTRAVASCULAR ULTRASOUND/IVUS N/A 09/29/2019   Procedure: Intravascular Ultrasound/IVUS;  Surgeon: Nelva Bush, MD;  Location: [place] [facility];  Service: Cardiovascular;  Laterality: N/A;  . LEFT HEART CATH AND CORONARY ANGIOGRAPHY N/A 10/25/2018   Procedure: LEFT HEART CATH AND CORONARY ANGIOGRAPHY;  Surgeon: Jettie Booze, MD;  Location: [place] [facility];  Service: Cardiovascular;  Laterality: N/A;  . LEFT HEART CATH AND CORONARY ANGIOGRAPHY N/A 09/29/2019   Procedure: LEFT HEART CATH AND CORONARY  ANGIOGRAPHY;  Surgeon: Nelva Bush, MD;  Location: [place] [facility];  Service: Cardiovascular;  Laterality: N/A;  . LEFT HEART CATHETERIZATION WITH CORONARY ANGIOGRAM N/A 05/11/2011   Procedure: LEFT HEART CATHETERIZATION WITH CORONARY ANGIOGRAM;  Surgeon: Troy Sine, MD;  Location: [facility] CATH LAB;  Service: Cardiovascular;  Laterality: N/A;  Possible percutaneous coronary intervention, possible IVUS  . LEFT HEART CATHETERIZATION WITH CORONARY ANGIOGRAM N/A 12/15/2011   Procedure: LEFT  HEART CATHETERIZATION WITH CORONARY ANGIOGRAM;  Surgeon: Lorretta Harp, MD;  Location: [facility] CATH LAB;  Service: Cardiovascular;  Laterality: N/A;  . LEFT HEART CATHETERIZATION WITH CORONARY ANGIOGRAM N/A 09/05/2014   Procedure: LEFT HEART CATHETERIZATION WITH CORONARY ANGIOGRAM;  Surgeon: Burnell Blanks, MD;  Location: [facility] CATH LAB;  Service: Cardiovascular;  Laterality: N/A;  . LIPOMA EXCISION     back of the head  . NM MYOCAR PERF WALL MOTION  02/2012   lexiscan myoview; mild perfusion defect in mid inferolateral & basal inferolateral region (infarct/scar); EF 52%, abnormal but ow risk scan  . PERCUTANEOUS CORONARY STENT INTERVENTION (PCI-S)  09/05/2014   Procedure: PERCUTANEOUS CORONARY STENT INTERVENTION (PCI-S);  Surgeon: Burnell Blanks, MD;  Location: [facility] CATH LAB;  Service: Cardiovascular;;  . TONSILLECTOMY     Medication List:  Current Outpatient Medications  Medication Sig Dispense Refill  . amLODipine (NORVASC) 10 MG tablet Take 1 tablet (10 mg total) by mouth daily. 90 tablet 3  . ARIPiprazole (ABILIFY) 5 MG tablet Take 1 tablet (5 mg total) by mouth daily. 30 tablet 2  . aspirin EC 81 MG EC tablet Take 1 tablet (81 mg total) by mouth daily. (May buy from over the counter): Swallow whole for heart health 30 tablet 0  . buPROPion (WELLBUTRIN XL) 150 MG 24 hr tablet Take 1 tablet (150 mg total) by mouth every morning. 30 tablet 2  . EPINEPHrine 0.3 mg/0.3 mL IJ SOAJ injection Inject 0.3 mg into the muscle as needed for anaphylaxis. 1 each 2  . fluconazole (DIFLUCAN) 150 MG tablet Take 1 tablet weekly X 4 weeks. 4 tablet 0  . fluticasone (FLONASE) 50 MCG/ACT nasal spray Place 1 spray into both nostrils 2 (two) times daily as needed. 16 g 5  . LIVALO 4 MG TABS Take 1 tablet by mouth daily.    . metFORMIN (GLUCOPHAGE) 500 MG tablet TAKE 1 TABLET BY MOUTH TWICE DAILY WITH A MEAL 180 tablet 2  . metoprolol tartrate (LOPRESSOR) 100 MG tablet Take 1 tablet (100 mg total) by mouth 2  (two) times daily. 180 tablet 3  . nitroGLYCERIN (NITROSTAT) 0.4 MG SL tablet Place 1 tablet (0.4 mg total) under the tongue every 5 (five) minutes x 3 doses as needed for chest pain. 25 tablet 3  . SF 5000 PLUS 1.1 % CREA dental cream See admin instructions.    . ticagrelor (BRILINTA) 90 MG TABS tablet Take 1 tablet (90 mg total) by mouth 2 (two) times daily. For cardiac issues 30 tablet 0   No current facility-administered medications for this visit.   Allergies: Allergies  Allergen Reactions  . Bee Venom Anaphylaxis and Hives  . Shellfish Allergy Anaphylaxis and Hives  . Statins Other (See Comments)    Myalgias. Tolerating livalo. Pain   . Statins   . Testosterone Cypionate     Testerone Injection --Increased breast tissue    Social History: Social History   Socioeconomic History  . Marital status: Unknown    Spouse name: Not on file  . Number of  children: 2  . Years of education: GED  . Highest education level: Not on file  Occupational History  . Not on file  Tobacco Use  . Smoking status: Former Smoker    Packs/day: 1.00    Years: 10.00    Pack years: 10.00    Types: Cigarettes    Quit date: 10/07/2018    Years since quitting: 1.9  . Smokeless tobacco: Never Used  Vaping Use  . Vaping Use: Never used  Substance and Sexual Activity  . Alcohol use: Yes    Comment: 2 bottles of wine on 12/08/2019 (one occurrence)  . Drug use: Yes    Types: Cocaine    Comment: last use several months ago  . Sexual activity: Not Currently  Other Topics Concern  . Not on file  Social History Narrative   ** Merged History Encounter **       Social Determinants of Health   Financial Resource Strain: Not on file  Food Insecurity: Not on file  Transportation Needs: Not on file  Physical Activity: Not on file  Stress: Not on file  Social Connections: Not on file   Lives in a 59 year old apartment. Smoking: yes - 10 cigs while drinking about twice per week. Occupation: not  employed  Environmental HistoryFreight forwarder in the house: no Charity fundraiser in the family room: yes Carpet in the bedroom: yes Heating: electric Cooling: central Pet: no  Family History: Family History  Problem Relation Age of Onset  . Leukemia Mother   . Prostate cancer Father   . Coronary artery disease Paternal Grandmother   . Cancer Paternal Grandfather   . Cancer Brother    Problem                               Relation Asthma                                   No  Eczema                                No Food allergy                          No  Allergic rhino conjunctivitis     No   Review of Systems  Constitutional: Negative for appetite change, chills, fever and unexpected weight change.  HENT: Positive for congestion, postnasal drip and rhinorrhea.   Eyes: Negative for itching.  Respiratory: Negative for cough, chest tightness, shortness of breath and wheezing.   Cardiovascular: Negative for chest pain.  Gastrointestinal: Negative for abdominal pain.  Genitourinary: Negative for difficulty urinating.  Skin: Negative for rash.  Allergic/Immunologic: Positive for environmental allergies and food allergies.  Neurological: Negative for headaches.   Objective: BP 126/88 (BP Location: Left Arm, Patient Position: Sitting, Cuff Size: Normal)   Pulse 61   Temp 97.8 F (36.6 C) (Temporal)   Resp 18   Ht 5\' 8"  (1.727 m)   Wt 197 lb 3.2 oz (89.4 kg)   SpO2 99%   BMI 29.98 kg/m  Body mass index is 29.98 kg/m. Physical Exam Vitals and nursing note reviewed.  Constitutional:      Appearance: Normal appearance. He is well-developed.  HENT:     Head:  Normocephalic and atraumatic.     Right Ear: Tympanic membrane and external ear normal.     Left Ear: Tympanic membrane and external ear normal.     Nose: Congestion present.     Mouth/Throat:     Mouth: Mucous membranes are moist.     Pharynx: Oropharynx is clear.  Eyes:     Conjunctiva/sclera: Conjunctivae  normal.  Cardiovascular:     Rate and Rhythm: Normal rate and regular rhythm.     Heart sounds: Normal heart sounds. No murmur heard. No friction rub. No gallop.   Pulmonary:     Effort: Pulmonary effort is normal.     Breath sounds: Normal breath sounds. No wheezing, rhonchi or rales.  Musculoskeletal:     Cervical back: Neck supple.  Skin:    General: Skin is warm.     Findings: No rash.  Neurological:     Mental Status: He is alert and oriented to person, place, and time.  Psychiatric:        Behavior: Behavior normal.    The plan was reviewed with the patient/family, and all questions/concerned were addressed.  It was my pleasure to see [name] today and participate in his care. Please feel free to contact me with any questions or concerns.  Sincerely,  Rexene Alberts, DO Allergy & Immunology  [facility] office: [place] office: [phone]

## 2020-09-09 NOTE — Assessment & Plan Note (Signed)
Facial swelling in the past. No prior work up.  Declines bloodwork.  Continue to avoid.  For mild symptoms you can take over the counter antihistamines such as Benadryl and monitor symptoms closely. If symptoms worsen or if you have severe symptoms including breathing issues, throat closure, significant swelling, whole body hives, severe diarrhea and vomiting, lightheadedness then inject epinephrine and seek immediate medical care afterwards.

## 2020-09-09 NOTE — Assessment & Plan Note (Signed)
Perennial rhinitis symptoms for a few years.  Drinking wine makes it worse.  Tried Flonase as needed with minimal benefit.  No prior allergy/ENT evaluation.  Today's skin testing showed: Positive to grass, weed pollen, dust mites, cat, cockroach, ragweed, tree pollen. Borderline to dog.    Start environmental control measures as below.  May use over the counter antihistamines such as Zyrtec (cetirizine), Claritin (loratadine), Allegra (fexofenadine), or Xyzal (levocetirizine) daily as needed.  May use Flonase (fluticasone) nasal spray 1 spray per nostril twice a day as needed for nasal congestion.   Nasal saline spray (i.e., Simply Saline) or nasal saline lavage (i.e., NeilMed) is recommended as needed and prior to medicated nasal sprays.  Read about allergy injections - handout given.

## 2020-09-09 NOTE — Assessment & Plan Note (Signed)
Reaction to crab legs over 10 years ago in the form of facial swelling and rash.  No prior work-up.  Tolerates finned fish with no issues.  Today's skin testing showed: Positive to shellfish and mollusks.  Continue strict avoidance of shellfish and mollusks.   I have prescribed epinephrine injectable and demonstrated proper use. For mild symptoms you can take over the counter antihistamines such as Benadryl and monitor symptoms closely. If symptoms worsen or if you have severe symptoms including breathing issues, throat closure, significant swelling, whole body hives, severe diarrhea and vomiting, lightheadedness then inject epinephrine and seek immediate medical care afterwards.  Food action plan given.  Regarding the wine - he may have some sensitivity to the sulfites in the wine which there is no available testing for.   Avoid/limit wine consumption.   Milk can thicken mucous. Limit intake before bed.

## 2020-09-30 ENCOUNTER — Ambulatory Visit (INDEPENDENT_AMBULATORY_CARE_PROVIDER_SITE_OTHER): Payer: Medicaid Other | Admitting: Family Medicine

## 2020-09-30 ENCOUNTER — Other Ambulatory Visit: Payer: Self-pay

## 2020-09-30 ENCOUNTER — Encounter: Payer: Self-pay | Admitting: Family Medicine

## 2020-09-30 VITALS — BP 137/86 | HR 57 | Ht 68.0 in | Wt 196.0 lb

## 2020-09-30 DIAGNOSIS — R7309 Other abnormal glucose: Secondary | ICD-10-CM

## 2020-09-30 DIAGNOSIS — E1159 Type 2 diabetes mellitus with other circulatory complications: Secondary | ICD-10-CM

## 2020-09-30 DIAGNOSIS — R7303 Prediabetes: Secondary | ICD-10-CM

## 2020-09-30 DIAGNOSIS — F32A Depression, unspecified: Secondary | ICD-10-CM

## 2020-09-30 DIAGNOSIS — M21611 Bunion of right foot: Secondary | ICD-10-CM

## 2020-09-30 DIAGNOSIS — Z9889 Other specified postprocedural states: Secondary | ICD-10-CM

## 2020-09-30 DIAGNOSIS — Z09 Encounter for follow-up examination after completed treatment for conditions other than malignant neoplasm: Secondary | ICD-10-CM

## 2020-09-30 DIAGNOSIS — I10 Essential (primary) hypertension: Secondary | ICD-10-CM | POA: Diagnosis not present

## 2020-09-30 DIAGNOSIS — F419 Anxiety disorder, unspecified: Secondary | ICD-10-CM

## 2020-09-30 DIAGNOSIS — Z0000 Encounter for general adult medical examination without abnormal findings: Secondary | ICD-10-CM

## 2020-09-30 LAB — POCT URINALYSIS DIPSTICK
Bilirubin, UA: NEGATIVE
Blood, UA: NEGATIVE
Glucose, UA: NEGATIVE
Ketones, UA: NEGATIVE
Leukocytes, UA: NEGATIVE
Nitrite, UA: NEGATIVE
Protein, UA: NEGATIVE
Spec Grav, UA: 1.01 (ref 1.010–1.025)
Urobilinogen, UA: 0.2 E.U./dL
pH, UA: 6.5 (ref 5.0–8.0)

## 2020-09-30 LAB — POCT GLYCOSYLATED HEMOGLOBIN (HGB A1C): Hemoglobin A1C: 6.6 % — AB (ref 4.0–5.6)

## 2020-09-30 MED ORDER — METOPROLOL TARTRATE 100 MG PO TABS: 100.0000 mg | ORAL_TABLET | Freq: Two times a day (BID) | ORAL | 3 refills | Status: DC

## 2020-09-30 MED ORDER — SILDENAFIL CITRATE 100 MG PO TABS: 50.0000 mg | ORAL_TABLET | Freq: Every day | ORAL | 11 refills | Status: DC | PRN

## 2020-09-30 NOTE — Progress Notes (Signed)
Patient [name]   Established Patient Office Visit  Subjective:  Patient ID: ERUBIEL MANASCO, male    DOB: July 26, 1960  Age: 60 y.o. MRN: [id number]  CC:  Chief Complaint  Patient presents with  . Diabetes  . Hypertension  . Alcohol Problem    HPI [name] is a 60 year old male who presents for Follow Up today.    Patient Active Problem List   Diagnosis Date Noted  . Other allergic rhinitis 09/09/2020  . Adverse reaction to food, subsequent encounter 09/09/2020  . History of bee sting allergy 09/09/2020  . Alcohol dependence with alcohol-induced mood disorder ([place]) 03/19/2020  . MDD (major depressive disorder), recurrent episode, severe ([facility]) 03/18/2020  . History of substance abuse ([place]) 01/04/2020  . Severe 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 features ([place]) 12/10/2019  . Tinea cruris 07/06/2019  . Hemoglobin A1c less than 7.0% 07/06/2019  . Muscle strain of wrist 04/05/2019  . Pain in both wrists 04/05/2019  . Anxiety 04/05/2019  . Prediabetes 04/05/2019  . MDD (major depressive disorder) 01/12/2019  . Chest pain 10/25/2018  . MDD (major depressive disorder), recurrent severe, without psychosis ([place]) 08/15/2018  . Influenza A 07/17/2017  . Lactic acidosis   . Cough   . Nausea and vomiting   . Severe episode of recurrent major depressive disorder, without psychotic features ([place])   . MDD (major depressive disorder), single episode, severe with psychotic features ([place]) 06/27/2017  . MDD (major depressive disorder), severe ([place]) 06/23/2017  . Essential hypertension 01/13/2016  . Unstable angina ([place])   . ED (erectile dysfunction) 02/15/2014  . Atypical chest pain 12/14/2011  . Allergy to contrast media (used for diagnostic x-rays) 05/12/2011  . Dyslipidemia, goal LDL below 70 05/09/2011  . DM2 (diabetes mellitus, type 2) ([place]) 07/10/2010  . CAD S/P percutaneous coronary angioplasty 07/10/2010   Current  Status: Since he last office visit, he is doing well with no complaints. He states that he has followed up with Psychiatry for Alcohol Dependence.  He states that he denies suicidal ideations, homicidal ideations, or auditory hallucinations. He last drank alcohol on 08/27/2020. He denies fevers, chills, fatigue, recent infections, weight loss, and night sweats. He has not had any headaches, visual changes, dizziness, and falls. No chest pain, heart palpitations, cough and shortness of breath reported. Denies GI problems such as nausea, vomiting, diarrhea, and constipation. He has no reports of blood in stools, dysuria and hematuria. He is taking all medications as prescribed. He denies pain today.   Past Medical History:  Diagnosis Date  . Alcohol dependence ([place])   . Allergies   . Arthritis   . Chest pain   . Chronic lower back pain   . Chronic pain of right wrist   . Coronary artery disease    a. Multiple prior caths/PCI. Cath 2013 with possible spasm of RCA, 70% ISR of mid LCx with subsequent DES to mLCx and prox LCX. b. H/o microvascular angina. c. Recurrent angina 08/2014 - s/p PTCA/DES to prox Cx, PTCA/CBA to OM1.  c. LHC 06/10/15 with patent stents and some ISR in LCX and OM-1 that was not flow limiting --> Rx   . Dyslipidemia    a. Intolerant to many statins except tolerating Livalo.  Marland Kitchen GERD (gastroesophageal reflux disease)   . H/O cardiac catheterization 10/25/2018  . Heart attack ([place])   . Hypertension   . Myocardial infarction ([place]) ~ 2010  . S/P angioplasty with stent,  DES, to proximal and mid LCX 12/15/11 12/15/2011  . S/P foot surgery, right 04/2021  . Shoulder pain   . Stroke [facility])    pt. reports had a stroke around time of MI 2010  . Type II diabetes mellitus ([place])   . Unstable angina [facility])     Past Surgical History:  Procedure Laterality Date  . CARDIAC CATHETERIZATION  06/15/2002   LAD with prox 40% stenosis, norma L main, Cfx with 25% lesion, RCA with long mid 25%  stenosis (Dr. Vita Barley)  . CARDIAC CATHETERIZATION  04/01/2010   normal L main, LAD wit mild stenosis, L Cfx with 70% in-stent restenosis, RCA with 70% in-stent restenosis, LVEF >60% (Dr. K. Mali Hilty) - cutting ballon arthrectomy to RCA & Cfx (Dr. Rockne Menghini)  . CARDIAC CATHETERIZATION  08/25/2010   preserved global LV contractility; multivessel CAD, diffuse 90-95% in-stent restenosis in prox placed Cfx stent - cutting balloon arthrectomy in Cfx with multiple dilatations 90-95% to 0% stenosis (Dr. Corky Downs)  . CARDIAC CATHETERIZATION  01/26/2011   PCI & stenting of aggresive in-stent restenosis within previously stented AV groove Cfx with 3.0x59mm Taxus DES (previous stents were Promus) (Dr. Adora Fridge)  . CARDIAC CATHETERIZATION  05/11/2011   preserved LV function, 40% mid LAD stenosis, 30-40% narrowing proximal to stented semgnet of prox Cfx, patent mid RCA stent with smooth 20% narrowing in distal RCA (Dr. Corky Downs)  . CARDIAC CATHETERIZATION  12/15/2011   PCI & stenting of proximal & mid Cfx with DES - 3.0x16mm in proximal, 3.0x36mm in mid (Dr. Adora Fridge)  . CARDIAC CATHETERIZATION N/A 06/10/2015   Procedure: Left Heart Cath and Coronary Angiography;  Surgeon: Jolaine Artist, MD;  Location: [place] [facility];  Service: Cardiovascular;  Laterality: N/A;  . CARDIAC CATHETERIZATION  10/25/2018  . cardiometabolic testing  0/01/6760   good exercise effort, peak VO2 79% predicted with normal VO2 HR curves (mild deconditioning)  . COLONOSCOPY  12/2012   diminutive hyperplastic sigmoid poyp so repeat routine 2024  . CORONARY BALLOON ANGIOPLASTY N/A 10/25/2018   Procedure: CORONARY BALLOON ANGIOPLASTY;  Surgeon: Jettie Booze, MD;  Location: [place] [facility];  Service: Cardiovascular;  Laterality: N/A;  . CORONARY BALLOON ANGIOPLASTY N/A 09/29/2019   Procedure: CORONARY BALLOON ANGIOPLASTY;  Surgeon: Nelva Bush, MD;  Location: [place] [facility];  Service: Cardiovascular;  Laterality:  N/A;  . EXCISIONAL HEMORRHOIDECTOMY  1984  . INTRAVASCULAR ULTRASOUND/IVUS N/A 09/29/2019   Procedure: Intravascular Ultrasound/IVUS;  Surgeon: Nelva Bush, MD;  Location: [place] [facility];  Service: Cardiovascular;  Laterality: N/A;  . LEFT HEART CATH AND CORONARY ANGIOGRAPHY N/A 10/25/2018   Procedure: LEFT HEART CATH AND CORONARY ANGIOGRAPHY;  Surgeon: Jettie Booze, MD;  Location: [place] [facility];  Service: Cardiovascular;  Laterality: N/A;  . LEFT HEART CATH AND CORONARY ANGIOGRAPHY N/A 09/29/2019   Procedure: LEFT HEART CATH AND CORONARY ANGIOGRAPHY;  Surgeon: Nelva Bush, MD;  Location: [place] [facility];  Service: Cardiovascular;  Laterality: N/A;  . LEFT HEART CATHETERIZATION WITH CORONARY ANGIOGRAM N/A 05/11/2011   Procedure: LEFT HEART CATHETERIZATION WITH CORONARY ANGIOGRAM;  Surgeon: Troy Sine, MD;  Location[facility] CATH LAB;  Service: Cardiovascular;  Laterality: N/A;  Possible percutaneous coronary intervention, possible IVUS  . LEFT HEART CATHETERIZATION WITH CORONARY ANGIOGRAM N/A 12/15/2011   Procedure: LEFT HEART CATHETERIZATION WITH CORONARY ANGIOGRAM;  Surgeon: Lorretta Harp, MD;  Location[facility] C[facility] LAB;  Service: Cardiovascular;  Laterality: N/A;  . LEFT HEART CATHETERIZATION  WITH CORONARY ANGIOGRAM N/A 09/05/2014   Procedure: LEFT HEART CATHETERIZATION WITH CORONARY ANGIOGRAM;  Surgeon: Burnell Blanks, MD;  Location: [facility] CATH LAB;  Service: Cardiovascular;  Laterality: N/A;  . LIPOMA EXCISION     back of the head  . NM MYOCAR PERF WALL MOTION  02/2012   lexiscan myoview; mild perfusion defect in mid inferolateral & basal inferolateral region (infarct/scar); EF 52%, abnormal but ow risk scan  . PERCUTANEOUS CORONARY STENT INTERVENTION (PCI-S)  09/05/2014   Procedure: PERCUTANEOUS CORONARY STENT INTERVENTION (PCI-S);  Surgeon: Burnell Blanks, MD;  Location: [facility] CATH LAB;  Service: Cardiovascular;;  . TONSILLECTOMY      Family History  Problem  Relation Age of Onset  . Leukemia Mother   . Prostate cancer Father   . Coronary artery disease Paternal Grandmother   . Cancer Paternal Grandfather   . Cancer Brother     Social History   Socioeconomic History  . Marital status: Unknown    Spouse name: Not on file  . Number of children: 2  . Years of education: GED  . Highest education level: Not on file  Occupational History  . Not on file  Tobacco Use  . Smoking status: Former Smoker    Packs/day: 1.00    Years: 10.00    Pack years: 10.00    Types: Cigarettes    Quit date: 10/07/2018    Years since quitting: 1.9  . Smokeless tobacco: Never Used  Vaping Use  . Vaping Use: Never used  Substance and Sexual Activity  . Alcohol use: Yes    Comment: 2 bottles of wine on 12/08/2019 (one occurrence)  . Drug use: Yes    Types: Cocaine    Comment: last use several months ago  . Sexual activity: Not Currently  Other Topics Concern  . Not on file  Social History Narrative   ** Merged History Encounter **       Social Determinants of Health   Financial Resource Strain: Not on file  Food Insecurity: Not on file  Transportation Needs: Not on file  Physical Activity: Not on file  Stress: Not on file  Social Connections: Not on file  Intimate Partner Violence: Not on file    Outpatient Medications Prior to Visit  Medication Sig Dispense Refill  . amLODipine (NORVASC) 10 MG tablet Take 1 tablet (10 mg total) by mouth daily. 90 tablet 3  . ARIPiprazole (ABILIFY) 5 MG tablet Take 1 tablet (5 mg total) by mouth daily. 30 tablet 2  . aspirin EC 81 MG EC tablet Take 1 tablet (81 mg total) by mouth daily. (May buy from over the counter): Swallow whole for heart health 30 tablet 0  . EPINEPHrine 0.3 mg/0.3 mL IJ SOAJ injection Inject 0.3 mg into the muscle as needed for anaphylaxis. 1 each 2  . fluconazole (DIFLUCAN) 150 MG tablet Take 1 tablet weekly X 4 weeks. 4 tablet 0  . fluticasone (FLONASE) 50 MCG/ACT nasal spray Place 1  spray into both nostrils 2 (two) times daily as needed. 16 g 5  . LIVALO 4 MG TABS Take 1 tablet by mouth daily.    . metFORMIN (GLUCOPHAGE) 500 MG tablet TAKE 1 TABLET BY MOUTH TWICE DAILY WITH A MEAL 180 tablet 2  . nitroGLYCERIN (NITROSTAT) 0.4 MG SL tablet Place 1 tablet (0.4 mg total) under the tongue every 5 (five) minutes x 3 doses as needed for chest pain. 25 tablet 3  . SF 5000 PLUS 1.1 % CREA  dental cream See admin instructions.    . ticagrelor (BRILINTA) 90 MG TABS tablet Take 1 tablet (90 mg total) by mouth 2 (two) times daily. For cardiac issues 30 tablet 0  . buPROPion (WELLBUTRIN XL) 150 MG 24 hr tablet Take 1 tablet (150 mg total) by mouth every morning. 30 tablet 2  . metoprolol tartrate (LOPRESSOR) 100 MG tablet Take 1 tablet (100 mg total) by mouth 2 (two) times daily. 180 tablet 3   No facility-administered medications prior to visit.    Allergies  Allergen Reactions  . Bee Venom Anaphylaxis and Hives  . Shellfish Allergy Anaphylaxis and Hives  . Statins Other (See Comments)    Myalgias. Tolerating livalo. Pain   . Statins   . Testosterone Cypionate     Testerone Injection --Increased breast tissue     ROS Review of Systems  Constitutional: Negative.   HENT: Negative.   Eyes: Negative.   Respiratory: Negative.   Cardiovascular: Negative.   Gastrointestinal: Negative.   Endocrine: Negative.   Genitourinary: Negative.   Musculoskeletal: Negative.   Skin: Negative.   Allergic/Immunologic: Negative.   Neurological: Positive for dizziness (occasional ) and headaches (occasional ).  Hematological: Negative.   Psychiatric/Behavioral: Negative.    Objective:    Physical Exam Vitals and nursing note reviewed.  Constitutional:      Appearance: Normal appearance.  HENT:     Head: Normocephalic and atraumatic.     Mouth/Throat:     Mouth: Mucous membranes are moist.     Pharynx: Oropharynx is clear.  Cardiovascular:     Rate and Rhythm: Normal rate and  regular rhythm.     Pulses: Normal pulses.     Heart sounds: Normal heart sounds.  Pulmonary:     Effort: Pulmonary effort is normal.     Breath sounds: Normal breath sounds.  Abdominal:     General: Bowel sounds are normal.     Palpations: Abdomen is soft.  Musculoskeletal:     Cervical back: Normal range of motion and neck supple.  Skin:    General: Skin is warm and dry.     Comments: Well healed scared on right foot.   Neurological:     General: No focal deficit present.     Mental Status: He is alert and oriented to person, place, and time.  Psychiatric:        Mood and Affect: Mood normal.        Behavior: Behavior normal.        Thought Content: Thought content normal.        Judgment: Judgment normal.     BP 137/86   Pulse (!) 57   Ht 5\' 8"  (1.727 m)   Wt 196 lb (88.9 kg)   SpO2 100%   BMI 29.80 kg/m  Wt Readings from Last 3 Encounters:  09/30/20 196 lb (88.9 kg)  09/09/20 197 lb 3.2 oz (89.4 kg)  08/27/20 198 lb 3.2 oz (89.9 kg)     Health Maintenance Due  Topic Date Due  . URINE MICROALBUMIN  Never done  . COVID-19 Vaccine (1) Never done  . FOOT EXAM  03/03/2019  . OPHTHALMOLOGY EXAM  11/16/2019    There are no preventive [name] reminders to display for this patient.  Lab Results  Component Value Date   TSH 1.089 03/19/2020   Lab Results  Component Value Date   WBC 6.4 03/24/2020   HGB 15.1 03/24/2020   HCT 46.6 03/24/2020   MCV 87.1 03/24/2020  PLT 267 03/24/2020   Lab Results  Component Value Date   NA 136 03/24/2020   K 4.1 03/24/2020   CO2 22 03/24/2020   GLUCOSE 165 (H) 03/24/2020   BUN 15 03/24/2020   CREATININE 1.00 03/24/2020   BILITOT 0.6 03/17/2020   ALKPHOS 49 03/17/2020   AST 20 03/17/2020   ALT 31 03/17/2020   PROT 6.9 03/17/2020   ALBUMIN 4.4 03/17/2020   CALCIUM 9.7 03/24/2020   ANIONGAP 12 03/24/2020   Lab Results  Component Value Date   CHOL 168 03/17/2020   Lab Results  Component Value Date   HDL 45  03/17/2020   Lab Results  Component Value Date   LDLCALC 76 03/17/2020   Lab Results  Component Value Date   TRIG 237 (H) 03/17/2020   Lab Results  Component Value Date   CHOLHDL 3.7 03/17/2020   Lab Results  Component Value Date   HGBA1C 6.6 (A) 09/30/2020   Assessment & Plan:   1. Type 2 diabetes mellitus with other circulatory complication, without long-term current use of insulin ([place]) He will continue medication as prescribed, to decrease foods/beverages high in sugars and carbs and follow Heart Healthy or DASH diet. Increase physical activity to at least 30 minutes cardio exercise daily.  - POCT glycosylated hemoglobin (Hb A1C) - POCT urinalysis dipstick - Microalbumin / creatinine urine ratio  2. Prediabetes  3. Hemoglobin A1c less than 7.0% Hgb A1c is stable at 6.6 today. Monitor.   4. Essential hypertension The current medical regimen is effective; blood pressure is stable at 137/86 today; continue present plan and medications as prescribed. He will continue to take medications as prescribed, to decrease high sodium intake, excessive alcohol intake, increase potassium intake, smoking cessation, and increase physical activity of at least 30 minutes of cardio activity daily. He will continue to follow Heart Healthy or DASH diet. - POCT glycosylated hemoglobin (Hb A1C) - POCT urinalysis dipstick - Microalbumin / creatinine urine ratio  5. Anxiety - Ambulatory referral to Psychiatry  6. Anxiety and depression - Ambulatory referral to Psychiatry  7. History of foot surgery Well-healed scar. No pain/discomfort noted or reported.   8. Bunion of great toe of right foot  9. Healthcare maintenance - PSA - Testosterone  10. Follow up He will follow up 02/2021.  Meds ordered this encounter  Medications  . sildenafil (VIAGRA) 100 MG tablet    Sig: Take 0.5-1 tablets (50-100 mg total) by mouth daily as needed for erectile dysfunction.    Dispense:  5 tablet     Refill:  11  . metoprolol tartrate (LOPRESSOR) 100 MG tablet    Sig: Take 1 tablet (100 mg total) by mouth 2 (two) times daily.    Dispense:  180 tablet    Refill:  3    Orders Placed This Encounter  Procedures  . Microalbumin / creatinine urine ratio  . PSA  . Testosterone  . Ambulatory referral to Psychiatry  . POCT glycosylated hemoglobin (Hb A1C)  . POCT urinalysis dipstick     Referral Orders     Ambulatory referral to Psychiatry   Kathe Becton, MSN, ANE, FNP-BC [name] [place] [address] [place], [place] [address] [phone] fax   Problem List Items Addressed This Visit      Cardiovascular and Mediastinum   Essential hypertension (Chronic)   Relevant Medications   sildenafil (VIAGRA) 100 MG tablet   metoprolol tartrate (LOPRESSOR)  100 MG tablet   Other Relevant Orders   POCT glycosylated hemoglobin (Hb A1C) (Completed)   POCT urinalysis dipstick (Completed)   Microalbumin / creatinine urine ratio     Endocrine   DM2 (diabetes mellitus, type 2) ([name]) - Primary (Chronic)   Relevant Orders   POCT glycosylated hemoglobin (Hb A1C) (Completed)   POCT urinalysis dipstick (Completed)   Microalbumin / creatinine urine ratio     Other   Anxiety   Relevant Orders   Ambulatory referral to Psychiatry   Hemoglobin A1c less than 7.0%   Prediabetes    Other Visit Diagnoses    Anxiety and depression       Relevant Orders   Ambulatory referral to Psychiatry   History of foot surgery       Bunion of great toe of right foot       Healthcare maintenance       Relevant Orders   PSA   Testosterone   Follow up          Meds ordered this encounter  Medications  . sildenafil (VIAGRA) 100 MG tablet    Sig: Take 0.5-1 tablets (50-100 mg total) by mouth daily as needed for erectile dysfunction.    Dispense:  5 tablet    Refill:  11  . metoprolol tartrate  (LOPRESSOR) 100 MG tablet    Sig: Take 1 tablet (100 mg total) by mouth 2 (two) times daily.    Dispense:  180 tablet    Refill:  3    Follow-up: No follow-ups on file.    Azzie Glatter, FNP

## 2020-10-01 LAB — MICROALBUMIN / CREATININE URINE RATIO
Creatinine, Urine: 28.3 mg/dL
Microalb/Creat Ratio: <11 mg/g creat (ref 0–29)
Microalbumin, Urine: <3 ug/mL

## 2020-10-01 LAB — PSA: Prostate Specific Ag, Serum: 0.8 ng/mL (ref 0.0–4.0)

## 2020-10-01 LAB — TESTOSTERONE: Testosterone: 287 ng/dL (ref 264–916)

## 2020-10-02 ENCOUNTER — Encounter: Payer: Self-pay | Admitting: Family Medicine

## 2020-10-07 ENCOUNTER — Encounter: Payer: Self-pay | Admitting: Family Medicine

## 2020-10-09 ENCOUNTER — Other Ambulatory Visit: Payer: Self-pay | Admitting: Family Medicine

## 2020-10-09 DIAGNOSIS — F1024 Alcohol dependence with alcohol-induced mood disorder: Secondary | ICD-10-CM

## 2020-10-31 ENCOUNTER — Other Ambulatory Visit: Payer: Self-pay

## 2020-10-31 ENCOUNTER — Ambulatory Visit (INDEPENDENT_AMBULATORY_CARE_PROVIDER_SITE_OTHER): Payer: Medicaid Other | Admitting: Nurse Practitioner

## 2020-10-31 ENCOUNTER — Encounter: Payer: Self-pay | Admitting: Nurse Practitioner

## 2020-10-31 VITALS — BP 122/85 | HR 55 | Temp 97.7°F | Ht 68.0 in

## 2020-10-31 DIAGNOSIS — R300 Dysuria: Secondary | ICD-10-CM

## 2020-10-31 DIAGNOSIS — R739 Hyperglycemia, unspecified: Secondary | ICD-10-CM

## 2020-10-31 DIAGNOSIS — B356 Tinea cruris: Secondary | ICD-10-CM | POA: Diagnosis not present

## 2020-10-31 DIAGNOSIS — E785 Hyperlipidemia, unspecified: Secondary | ICD-10-CM | POA: Diagnosis not present

## 2020-10-31 LAB — POCT URINALYSIS DIPSTICK
Bilirubin, UA: NEGATIVE
Blood, UA: NEGATIVE
Glucose, UA: NEGATIVE
Ketones, UA: NEGATIVE
Nitrite, UA: NEGATIVE
Protein, UA: NEGATIVE
Spec Grav, UA: 1.01 (ref 1.010–1.025)
Urobilinogen, UA: 0.2 E.U./dL
pH, UA: 6 (ref 5.0–8.0)

## 2020-10-31 MED ORDER — NITROFURANTOIN MONOHYD MACRO 100 MG PO CAPS: 100.0000 mg | ORAL_CAPSULE | Freq: Two times a day (BID) | ORAL | 0 refills | Status: AC

## 2020-10-31 MED ORDER — FLUCONAZOLE 150 MG PO TABS: ORAL_TABLET | ORAL | 0 refills | Status: DC

## 2020-10-31 NOTE — Patient Instructions (Signed)
Urinary Tract Infection, Adult A urinary tract infection (UTI) is an infection of any part of the urinary tract. The urinary tract includes:  The kidneys.  The ureters.  The bladder.  The urethra. These organs make, store, and get rid of pee (urine) in the body. What are the causes? This infection is caused by germs (bacteria) in your genital area. These germs grow and cause swelling (inflammation) of your urinary tract. What increases the risk? The following factors may make you more likely to develop this condition:  Using a small, thin tube (catheter) to drain pee.  Not being able to control when you pee or poop (incontinence).  Being male. If you are male, these things can increase the risk: ? Using these methods to prevent pregnancy:  A medicine that kills sperm (spermicide).  A device that blocks sperm (diaphragm). ? Having low levels of a male hormone (estrogen). ? Being pregnant. You are more likely to develop this condition if:  You have genes that add to your risk.  You are sexually active.  You take antibiotic medicines.  You have trouble peeing because of: ? A prostate that is bigger than normal, if you are male. ? A blockage in the part of your body that drains pee from the bladder. ? A kidney stone. ? A nerve condition that affects your bladder. ? Not getting enough to drink. ? Not peeing often enough.  You have other conditions, such as: ? Diabetes. ? A weak disease-fighting system (immune system). ? Sickle cell disease. ? Gout. ? Injury of the spine. What are the signs or symptoms? Symptoms of this condition include:  Needing to pee right away.  Peeing small amounts often.  Pain or burning when peeing.  Blood in the pee.  Pee that smells bad or not like normal.  Trouble peeing.  Pee that is cloudy.  Fluid coming from the vagina, if you are male.  Pain in the belly or lower back. Other symptoms include:  Vomiting.  Not  feeling hungry.  Feeling mixed up (confused). This may be the first symptom in older adults.  Being tired and grouchy (irritable).  A fever.  Watery poop (diarrhea). How is this treated?  Taking antibiotic medicine.  Taking other medicines.  Drinking enough water. In some cases, you may need to see a specialist. Follow these instructions at home: Medicines  Take over-the-counter and prescription medicines only as told by your doctor.  If you were prescribed an antibiotic medicine, take it as told by your doctor. Do not stop taking it even if you start to feel better. General instructions  Make sure you: ? Pee until your bladder is empty. ? Do not hold pee for a long time. ? Empty your bladder after sex. ? Wipe from front to back after peeing or pooping if you are a male. Use each tissue one time when you wipe.  Drink enough fluid to keep your pee pale yellow.  Keep all follow-up visits.   Contact a doctor if:  You do not get better after 1-2 days.  Your symptoms go away and then come back. Get help right away if:  You have very bad back pain.  You have very bad pain in your lower belly.  You have a fever.  You have chills.  You feeling like you will vomit or you vomit. Summary  A urinary tract infection (UTI) is an infection of any part of the urinary tract.  This condition is caused by   germs in your genital area.  There are many risk factors for a UTI.  Treatment includes antibiotic medicines.  Drink enough fluid to keep your pee pale yellow. This information is not intended to replace advice given to you by your health care provider. Make sure you discuss any questions you have with your health care provider. Document Revised: 01/26/2020 Document Reviewed: 01/26/2020 Elsevier Patient Education  2021 [employer]  

## 2020-10-31 NOTE — Progress Notes (Signed)
[place], [place]  [address] Phone:  [phone]   Fax:  [phone]   Acute Office Visit  Subjective:    Patient ID: [name], male    DOB: 04/03/1961, 60 y.o.   MRN: [id number]  Chief Complaint  Patient presents with  . Dysuria    Pain with urine and urine burn , pt wanted to know he can have something for jock ICTH     HPI  Patient is in today for dysuria. He  has a past medical history of Alcohol dependence ([place]), Allergies, Arthritis, Chest pain, Chronic lower back pain, Chronic pain of right wrist, Coronary artery disease, Dyslipidemia, GERD (gastroesophageal reflux disease), H/O cardiac catheterization (10/25/2018), Heart attack ([place]), Hypertension, Myocardial infarction ([place]) (~ 2010), S/P angioplasty with stent, DES, to proximal and mid LCX 12/15/11 (12/15/2011), S/P foot surgery, right (04/2021), Shoulder pain, Stroke ([place]), Type II diabetes mellitus ([place]), and Unstable angina ([place]).    Urinary Tract Infection Patient complains of dysuria. He has had symptoms for 3 weeks.  No additional symptoms. Patient denies back pain, congestion, cough, fever, headache, rhinitis, sorethroat and stomach ache, hesitancy, frequency or nocturia. Patient does have a history of recurrent UTI. Patient does not have a history of pyelonephritis. He did have a UTI 2 years.  PSA evaluated on 09/30/2020 within normal range PSA 0.8 and Hemoglobin A1c 6.6.  Patient is currently on vegetarian diet and exercises regularly   Past Medical History:  Diagnosis Date  . Alcohol dependence ([place])   . Allergies   . Arthritis   . Chest pain   . Chronic lower back pain   . Chronic pain of right wrist   . Coronary artery disease    a. Multiple prior caths/PCI. Cath 2013 with possible spasm of RCA, 70% ISR of mid LCx with subsequent DES to mLCx and prox LCX. b. H/o microvascular angina. c. Recurrent angina 08/2014 - s/p PTCA/DES to prox Cx, PTCA/CBA to OM1.  c. LHC  06/10/15 with patent stents and some ISR in LCX and OM-1 that was not flow limiting --> Rx   . Dyslipidemia    a. Intolerant to many statins except tolerating Livalo.  Marland Kitchen GERD (gastroesophageal reflux disease)   . H/O cardiac catheterization 10/25/2018  . Heart attack ([place])   . Hypertension   . Myocardial infarction ([place]) ~ 2010  . S/P angioplasty with stent, DES, to proximal and mid LCX 12/15/11 12/15/2011  . S/P foot surgery, right 04/2021  . Shoulder pain   . Stroke [facility])    pt. reports had a stroke around time of MI 2010  . Type II diabetes mellitus ([place])   . Unstable angina [facility])     Past Surgical History:  Procedure Laterality Date  . CARDIAC CATHETERIZATION  06/15/2002   LAD with prox 40% stenosis, norma L main, Cfx with 25% lesion, RCA with long mid 25% stenosis (Dr. Vita Barley)  . CARDIAC CATHETERIZATION  04/01/2010   normal L main, LAD wit mild stenosis, L Cfx with 70% in-stent restenosis, RCA with 70% in-stent restenosis, LVEF >60% (Dr. K. Mali Hilty) - cutting ballon arthrectomy to RCA & Cfx (Dr. Rockne Menghini)  . CARDIAC CATHETERIZATION  08/25/2010   preserved global LV contractility; multivessel CAD, diffuse 90-95% in-stent restenosis in prox placed Cfx stent - cutting balloon arthrectomy in Cfx with multiple dilatations 90-95% to 0% stenosis (Dr. Corky Downs)  . CARDIAC CATHETERIZATION  01/26/2011   PCI & stenting  of aggresive in-stent restenosis within previously stented AV groove Cfx with 3.0x47mm Taxus DES (previous stents were Promus) (Dr. Adora Fridge)  . CARDIAC CATHETERIZATION  05/11/2011   preserved LV function, 40% mid LAD stenosis, 30-40% narrowing proximal to stented semgnet of prox Cfx, patent mid RCA stent with smooth 20% narrowing in distal RCA (Dr. Corky Downs)  . CARDIAC CATHETERIZATION  12/15/2011   PCI & stenting of proximal & mid Cfx with DES - 3.0x9mm in proximal, 3.0x47mm in mid (Dr. Adora Fridge)  . CARDIAC CATHETERIZATION N/A 06/10/2015   Procedure: Left Heart Cath and  Coronary Angiography;  Surgeon: Jolaine Artist, MD;  Location: [place] [facility];  Service: Cardiovascular;  Laterality: N/A;  . CARDIAC CATHETERIZATION  10/25/2018  . cardiometabolic testing  123456   good exercise effort, peak VO2 79% predicted with normal VO2 HR curves (mild deconditioning)  . COLONOSCOPY  12/2012   diminutive hyperplastic sigmoid poyp so repeat routine 2024  . CORONARY BALLOON ANGIOPLASTY N/A 10/25/2018   Procedure: CORONARY BALLOON ANGIOPLASTY;  Surgeon: Jettie Booze, MD;  Location: [place] [facility];  Service: Cardiovascular;  Laterality: N/A;  . CORONARY BALLOON ANGIOPLASTY N/A 09/29/2019   Procedure: CORONARY BALLOON ANGIOPLASTY;  Surgeon: Nelva Bush, MD;  Location: [place] [facility];  Service: Cardiovascular;  Laterality: N/A;  . EXCISIONAL HEMORRHOIDECTOMY  1984  . INTRAVASCULAR ULTRASOUND/IVUS N/A 09/29/2019   Procedure: Intravascular Ultrasound/IVUS;  Surgeon: Nelva Bush, MD;  Location: [place] [facility];  Service: Cardiovascular;  Laterality: N/A;  . LEFT HEART CATH AND CORONARY ANGIOGRAPHY N/A 10/25/2018   Procedure: LEFT HEART CATH AND CORONARY ANGIOGRAPHY;  Surgeon: Jettie Booze, MD;  Location: [place] [facility];  Service: Cardiovascular;  Laterality: N/A;  . LEFT HEART CATH AND CORONARY ANGIOGRAPHY N/A 09/29/2019   Procedure: LEFT HEART CATH AND CORONARY ANGIOGRAPHY;  Surgeon: Nelva Bush, MD;  Location: [place] [facility];  Service: Cardiovascular;  Laterality: N/A;  . LEFT HEART CATHETERIZATION WITH CORONARY ANGIOGRAM N/A 05/11/2011   Procedure: LEFT HEART CATHETERIZATION WITH CORONARY ANGIOGRAM;  Surgeon: Troy Sine, MD;  Location: [facility] CATH LAB;  Service: Cardiovascular;  Laterality: N/A;  Possible percutaneous coronary intervention, possible IVUS  . LEFT HEART CATHETERIZATION WITH CORONARY ANGIOGRAM N/A 12/15/2011   Procedure: LEFT HEART CATHETERIZATION WITH CORONARY ANGIOGRAM;  Surgeon: Lorretta Harp, MD;  Location:  [facility] CATH LAB;  Service: Cardiovascular;  Laterality: N/A;  . LEFT HEART CATHETERIZATION WITH CORONARY ANGIOGRAM N/A 09/05/2014   Procedure: LEFT HEART CATHETERIZATION WITH CORONARY ANGIOGRAM;  Surgeon: Burnell Blanks, MD;  Location: [facility] CATH LAB;  Service: Cardiovascular;  Laterality: N/A;  . LIPOMA EXCISION     back of the head  . NM MYOCAR PERF WALL MOTION  02/2012   lexiscan myoview; mild perfusion defect in mid inferolateral & basal inferolateral region (infarct/scar); EF 52%, abnormal but ow risk scan  . PERCUTANEOUS CORONARY STENT INTERVENTION (PCI-S)  09/05/2014   Procedure: PERCUTANEOUS CORONARY STENT INTERVENTION (PCI-S);  Surgeon: Burnell Blanks, MD;  Location: [facility] CATH LAB;  Service: Cardiovascular;;  . TONSILLECTOMY      Family History  Problem Relation Age of Onset  . Leukemia Mother   . Prostate cancer Father   . Coronary artery disease Paternal Grandmother   . Cancer Paternal Grandfather   . Cancer Brother     Social History   Socioeconomic History  . Marital status: Unknown    Spouse name: Not on file  . Number of children: 2  . Years of  education: GED  . Highest education level: Not on file  Occupational History  . Not on file  Tobacco Use  . Smoking status: Former Smoker    Packs/day: 1.00    Years: 10.00    Pack years: 10.00    Types: Cigarettes    Quit date: 10/07/2018    Years since quitting: 2.0  . Smokeless tobacco: Never Used  Vaping Use  . Vaping Use: Never used  Substance and Sexual Activity  . Alcohol use: Yes    Comment: 2 bottles of wine on 12/08/2019 (one occurrence)  . Drug use: Yes    Types: Cocaine    Comment: last use several months ago  . Sexual activity: Not Currently  Other Topics Concern  . Not on file  Social History Narrative   ** Merged History Encounter **       Social Determinants of Health   Financial Resource Strain: Not on file  Food Insecurity: Not on file  Transportation Needs: Not on file  Physical  Activity: Not on file  Stress: Not on file  Social Connections: Not on file  Intimate Partner Violence: Not on file    Outpatient Medications Prior to Visit  Medication Sig Dispense Refill  . amLODipine (NORVASC) 10 MG tablet Take 1 tablet (10 mg total) by mouth daily. 90 tablet 3  . ARIPiprazole (ABILIFY) 5 MG tablet Take 1 tablet (5 mg total) by mouth daily. 30 tablet 2  . aspirin EC 81 MG EC tablet Take 1 tablet (81 mg total) by mouth daily. (May buy from over the counter): Swallow whole for heart health 30 tablet 0  . EPINEPHrine 0.3 mg/0.3 mL IJ SOAJ injection Inject 0.3 mg into the muscle as needed for anaphylaxis. 1 each 2  . fluticasone (FLONASE) 50 MCG/ACT nasal spray Place 1 spray into both nostrils 2 (two) times daily as needed. 16 g 5  . metFORMIN (GLUCOPHAGE) 500 MG tablet TAKE 1 TABLET BY MOUTH TWICE DAILY WITH A MEAL 180 tablet 2  . metoprolol tartrate (LOPRESSOR) 100 MG tablet Take 1 tablet (100 mg total) by mouth 2 (two) times daily. 180 tablet 3  . nitroGLYCERIN (NITROSTAT) 0.4 MG SL tablet Place 1 tablet (0.4 mg total) under the tongue every 5 (five) minutes x 3 doses as needed for chest pain. 25 tablet 3  . SF 5000 PLUS 1.1 % CREA dental cream See admin instructions.    . ticagrelor (BRILINTA) 90 MG TABS tablet Take 1 tablet (90 mg total) by mouth 2 (two) times daily. For cardiac issues 30 tablet 0  . fluconazole (DIFLUCAN) 150 MG tablet Take 1 tablet weekly X 4 weeks. 4 tablet 0  . LIVALO 4 MG TABS Take 1 tablet by mouth daily.    . sildenafil (VIAGRA) 100 MG tablet Take 0.5-1 tablets (50-100 mg total) by mouth daily as needed for erectile dysfunction. 5 tablet 11   No facility-administered medications prior to visit.    Allergies  Allergen Reactions  . Bee Venom Anaphylaxis and Hives  . Shellfish Allergy Anaphylaxis and Hives  . Statins Other (See Comments)    Myalgias. Tolerating livalo. Pain   . Statins   . Testosterone Cypionate     Testerone Injection  --Increased breast tissue     Review of Systems     Objective:    Physical Exam Constitutional:      General: He is not in acute distress.    Appearance: He is not ill-appearing, toxic-appearing or diaphoretic.  Cardiovascular:  Rate and Rhythm: Normal rate and regular rhythm.     Pulses: Normal pulses.     Heart sounds: Normal heart sounds.  Pulmonary:     Effort: Pulmonary effort is normal.     Breath sounds: Normal breath sounds.  Musculoskeletal:        General: Normal range of motion.     Cervical back: Normal range of motion.  Skin:    General: Skin is warm.     Capillary Refill: Capillary refill takes less than 2 seconds.  Neurological:     General: No focal deficit present.     Mental Status: He is alert and oriented to person, place, and time.  Psychiatric:        Mood and Affect: Mood normal.        Behavior: Behavior normal.        Thought Content: Thought content normal.        Judgment: Judgment normal.     BP 122/85   Pulse (!) 55   Temp 97.7 F (36.5 C) (Temporal)   Ht 5\' 8"  (1.727 m)   SpO2 96%   BMI 29.80 kg/m  Wt Readings from Last 3 Encounters:  09/30/20 196 lb (88.9 kg)  09/09/20 197 lb 3.2 oz (89.4 kg)  05/07/20 189 lb (85.7 kg)    Health Maintenance Due  Topic Date Due  . COVID-19 Vaccine (1) Never done  . FOOT EXAM  03/03/2019  . OPHTHALMOLOGY EXAM  11/16/2019    There are no preventive care reminders to display for this patient.   Lab Results  Component Value Date   TSH 1.089 03/19/2020   Lab Results  Component Value Date   WBC 6.4 03/24/2020   HGB 15.1 03/24/2020   HCT 46.6 03/24/2020   MCV 87.1 03/24/2020   PLT 267 03/24/2020   Lab Results  Component Value Date   NA 136 03/24/2020   K 4.1 03/24/2020   CO2 22 03/24/2020   GLUCOSE 165 (H) 03/24/2020   BUN 15 03/24/2020   CREATININE 1.00 03/24/2020   BILITOT 0.6 03/17/2020   ALKPHOS 49 03/17/2020   AST 20 03/17/2020   ALT 31 03/17/2020   PROT 6.9 03/17/2020    ALBUMIN 4.4 03/17/2020   CALCIUM 9.7 03/24/2020   ANIONGAP 12 03/24/2020   Lab Results  Component Value Date   CHOL 168 03/17/2020   Lab Results  Component Value Date   HDL 45 03/17/2020   Lab Results  Component Value Date   LDLCALC 76 03/17/2020   Lab Results  Component Value Date   TRIG 237 (H) 03/17/2020   Lab Results  Component Value Date   CHOLHDL 3.7 03/17/2020   Lab Results  Component Value Date   HGBA1C 6.6 (A) 09/30/2020       Assessment & Plan:   Problem List Items Addressed This Visit      Musculoskeletal and Integument   Tinea cruris Recurrent patient education provided   Relevant Medications   nitrofurantoin, macrocrystal-monohydrate, (MACROBID) 100 MG capsule   fluconazole (DIFLUCAN) 150 MG tablet    Other Visit Diagnoses    Burning with urination    -  Primary Empirical anbx treatment due to symptoms and history.  Urine culture pending Encourage completion of anbx even when symptoms improve.  Discussed resistance with anbx overuse Discussed allergic reactions with anbx.  Encourage increasing hydration with water and how to tell when this is achieved Add cranberry juice 100% 8-16 ozs daily until symptoms improve Discussed  hygiene  Avoid not voiding when the urge presents    Relevant Orders   POCT Urinalysis Dipstick (Completed)   Urine Culture   CBC with Differential/Platelet   Jock itch        Relevant Medications   nitrofurantoin, macrocrystal-monohydrate, (MACROBID) 100 MG capsule   fluconazole (DIFLUCAN) 150 MG tablet   Dysuria       Hyperglycemia       Relevant Orders   Comp. Metabolic Panel (12)   Hyperlipidemia, unspecified hyperlipidemia type       Relevant Orders   Lipid panel       Meds ordered this encounter  Medications  . nitrofurantoin, macrocrystal-monohydrate, (MACROBID) 100 MG capsule    Sig: Take 1 capsule (100 mg total) by mouth 2 (two) times daily for 5 days.    Dispense:  10 capsule    Refill:  0     Order Specific Question:   Supervising Provider    Answer:   Tresa Garter [id number]  . fluconazole (DIFLUCAN) 150 MG tablet    Sig: Take 1 tablet weekly X 4 weeks.    Dispense:  4 tablet    Refill:  0    Order Specific Question:   Supervising Provider    Answer:   Tresa Garter [[id number]]     Vevelyn Francois, NP

## 2020-11-02 LAB — CBC WITH DIFFERENTIAL/PLATELET
Basophils Absolute: 0.1 10*3/uL (ref 0.0–0.2)
Basos: 1 %
EOS (ABSOLUTE): 0.3 10*3/uL (ref 0.0–0.4)
Eos: 6 %
Hematocrit: 44.2 % (ref 37.5–51.0)
Hemoglobin: 14.5 g/dL (ref 13.0–17.7)
Immature Grans (Abs): 0 10*3/uL (ref 0.0–0.1)
Immature Granulocytes: 0 %
Lymphocytes Absolute: 1.9 10*3/uL (ref 0.7–3.1)
Lymphs: 33 %
MCH: 27.9 pg (ref 26.6–33.0)
MCHC: 32.8 g/dL (ref 31.5–35.7)
MCV: 85 fL (ref 79–97)
Monocytes Absolute: 0.4 10*3/uL (ref 0.1–0.9)
Monocytes: 7 %
Neutrophils Absolute: 3.2 10*3/uL (ref 1.4–7.0)
Neutrophils: 53 %
Platelets: 246 10*3/uL (ref 150–450)
RBC: 5.2 x10E6/uL (ref 4.14–5.80)
RDW: 12.7 % (ref 11.6–15.4)
WBC: 5.9 10*3/uL (ref 3.4–10.8)

## 2020-11-02 LAB — LIPID PANEL
Chol/HDL Ratio: 3.7 ratio (ref 0.0–5.0)
Cholesterol, Total: 161 mg/dL (ref 100–199)
HDL: 44 mg/dL (ref >39)
LDL Chol Calc (NIH): 99 mg/dL (ref 0–99)
Triglycerides: 96 mg/dL (ref 0–149)
VLDL Cholesterol Cal: 18 mg/dL (ref 5–40)

## 2020-11-02 LAB — COMP. METABOLIC PANEL (12)
AST: 19 IU/L (ref 0–40)
Albumin/Globulin Ratio: 2.3 — ABNORMAL HIGH (ref 1.2–2.2)
Albumin: 4.8 g/dL (ref 3.8–4.9)
Alkaline Phosphatase: 72 IU/L (ref 44–121)
BUN/Creatinine Ratio: 11 (ref 9–20)
BUN: 11 mg/dL (ref 6–24)
Bilirubin Total: 0.5 mg/dL (ref 0.0–1.2)
Calcium: 9.9 mg/dL (ref 8.7–10.2)
Chloride: 102 mmol/L (ref 96–106)
Creatinine, Ser: 1.04 mg/dL (ref 0.76–1.27)
Globulin, Total: 2.1 g/dL (ref 1.5–4.5)
Glucose: 97 mg/dL (ref 65–99)
Potassium: 4.4 mmol/L (ref 3.5–5.2)
Sodium: 139 mmol/L (ref 134–144)
Total Protein: 6.9 g/dL (ref 6.0–8.5)
eGFR: 83 mL/min/{1.73_m2} (ref >59)

## 2020-11-03 LAB — URINE CULTURE: Organism ID, Bacteria: NO GROWTH

## 2020-11-11 ENCOUNTER — Other Ambulatory Visit: Payer: Self-pay | Admitting: Nurse Practitioner

## 2020-11-11 ENCOUNTER — Telehealth: Payer: Self-pay

## 2020-11-11 MED ORDER — LIVALO 4 MG PO TABS: 1.0000 | ORAL_TABLET | Freq: Every day | ORAL | 3 refills | Status: DC

## 2020-11-11 NOTE — Telephone Encounter (Signed)
Called and spoke with patient he said that he is taking this once a day ,and he said he gets a 90 day supply

## 2020-11-11 NOTE — Telephone Encounter (Signed)
Med refill  Livalo 4 mg

## 2020-11-11 NOTE — Progress Notes (Unsigned)
   [facility] [address] [place], [place]  [address] Phone:  [phone]   Fax:  [phone] 

## 2020-11-11 NOTE — Telephone Encounter (Signed)
Sent. thanks

## 2020-11-11 NOTE — Telephone Encounter (Signed)
I would call and clarify.  He may have been using this once a week.  Clarify and update  Thanks

## 2020-11-11 NOTE — Telephone Encounter (Signed)
Pt hasn't this since 11/21 is this okay to refill

## 2020-11-12 ENCOUNTER — Telehealth: Payer: Self-pay | Admitting: Nurse Practitioner

## 2020-11-12 NOTE — Telephone Encounter (Signed)
Sent to Cover my meds. The medication is Livalo 4mg . The key is [facility]

## 2020-11-13 ENCOUNTER — Telehealth: Payer: Self-pay

## 2020-11-13 NOTE — Telephone Encounter (Signed)
This was sent on 11/08/20 thanks

## 2020-11-13 NOTE — Telephone Encounter (Signed)
livalo 4 mg

## 2020-11-20 ENCOUNTER — Encounter (HOSPITAL_COMMUNITY): Payer: Medicaid Other | Admitting: Psychiatry

## 2020-11-22 ENCOUNTER — Telehealth: Payer: Self-pay | Admitting: Internal Medicine

## 2020-11-22 MED ORDER — LIVALO 4 MG PO TABS: 1.0000 | ORAL_TABLET | Freq: Every day | ORAL | 3 refills | Status: DC

## 2020-11-22 NOTE — Telephone Encounter (Signed)
*  STAT* If patient is at the pharmacy, call can be transferred to refill team.   1. Which medications need to be refilled? (please list name of each medication and dose if known) Lavilo   2. Which pharmacy/location (including street and city if local pharmacy) is medication to be sent to? [facility]   3. Do they need a 30 day or 90 day supply? 90 Patient has been for about 3 weeks

## 2020-11-22 NOTE — Telephone Encounter (Signed)
Pt c/o medication issue:  1. Name of Medication: LIVALO 4 MG TABS  2. How are you currently taking this medication (dosage and times per day)? As directed  3. Are you having a reaction (difficulty breathing--STAT)? no  4. What is your medication issue? Patient needs prior authorization for this medication.   [phone] is the number the patient called before he called [place]  Reference # [id number]

## 2020-11-22 NOTE — Telephone Encounter (Signed)
**Note De-Identified  Obfuscation** I called [employer] and did this Livalo PA over the phone with Butch Penny. Per Butch Penny this PA has been approved until 11/17/2021. PA #: [id number]  I have notified [place] and the pt of this approval.

## 2020-11-22 NOTE — Telephone Encounter (Signed)
Message routed to [facility] to assist with prior authorization

## 2020-11-22 NOTE — Telephone Encounter (Signed)
Livalo Rx sent to desired pharmacy.

## 2020-12-12 ENCOUNTER — Telehealth: Payer: Self-pay

## 2020-12-12 NOTE — Telephone Encounter (Signed)
Pt ask if a nurse can call him about his KETO....something about his diabetics

## 2020-12-13 NOTE — Telephone Encounter (Signed)
Left voice mail to call back 

## 2020-12-13 NOTE — Telephone Encounter (Signed)
Patient stated he feels he is having Sx of DKA: Frequent urination, dry mouth, tinging in feet. Advised patient that unfortunately we do not have nay in person appointments until beginning of August. Advised patient that this time I do believe it is best he go to Urgent Care to have CBG, A1C checked if they believe he needs to go to ER they will advise him that there. Patient verbally understand stated he was going to make arrangements now to head to Urgent Care on [facility].

## 2020-12-14 ENCOUNTER — Encounter (HOSPITAL_COMMUNITY): Payer: Self-pay

## 2020-12-14 ENCOUNTER — Ambulatory Visit (HOSPITAL_COMMUNITY)
Admission: EM | Admit: 2020-12-14 | Discharge: 2020-12-14 | Disposition: A | Discharge status: Home / Self Care | Payer: Medicaid Other | Attending: Family Medicine | Admitting: Family Medicine

## 2020-12-14 DIAGNOSIS — E1165 Type 2 diabetes mellitus with hyperglycemia: Secondary | ICD-10-CM | POA: Diagnosis not present

## 2020-12-14 DIAGNOSIS — R739 Hyperglycemia, unspecified: Secondary | ICD-10-CM

## 2020-12-14 LAB — CBG MONITORING, ED: Glucose-Capillary: 111 mg/dL — ABNORMAL HIGH (ref 70–99)

## 2020-12-14 MED ORDER — BLOOD GLUCOSE MONITOR KIT: PACK | 0 refills | Status: DC

## 2020-12-14 NOTE — ED Triage Notes (Signed)
Pt in with c/o dry mouth, urinary frequency, polydipsia, and tingling in feet x 2 weeks  States he is diabetic but does not monitor blood sugar daily   Denies vision changes, weakness

## 2020-12-14 NOTE — ED Provider Notes (Signed)
[place]   [id number] 12/14/20 Arrival Time: 1005  ASSESSMENT & PLAN:  1. Hyperglycemia    Labs Reviewed  CBG MONITORING, ED - Abnormal; Notable for the following components:      Result Value   Glucose-Capillary 111 (*)    All other components within normal limits   No medication changes today. He prefers to obtain a glucose monitor and record until he sees PCP.   Follow-up Information     Schedule an appointment as soon as possible for a visit  with Vevelyn Francois, NP.   Specialty: Adult Health Nurse Practitioner Contact information: [address] Renee Harder [place] [address] [phone]                Meds ordered this encounter  Medications   blood glucose meter kit and supplies KIT    Sig: Dispense based on patient and insurance preference. Use up to four times daily as directed. (FOR ICD-9 250.00, 250.01).    Dispense:  1 each    Refill:  0    Order Specific Question:   Number of strips    Answer:   100    Order Specific Question:   Number of lancets    Answer:   100   Reviewed expectations re: course of current medical issues. Questions answered. Outlined signs and symptoms indicating need for more acute intervention. Understanding verbalized. After Visit Summary given.   SUBJECTIVE: History from: patient. [name] is a 60 y.o. male who reports urinary frequency and increased thirst. Diabetic. Taking meds as directed. Recent URI but improving. Afebrile. Normal PO intake without n/v/d. Coffee this morning.    OBJECTIVE:  Vitals:   12/14/20 1037  BP: 115/77  Pulse: 62  Resp: 20  Temp: 98.3 F (36.8 C)  TempSrc: Oral  SpO2: 100%    General appearance: alert; no distress Eyes: PERRLA; EOMI; conjunctiva normal HENT: [place]; AT; with mild nasal congestion Neck: supple  Lungs: speaks full sentences without difficulty; unlabored Extremities: no edema Skin: warm and dry Neurologic: normal gait Psychological: alert and  cooperative; normal mood and affect  Labs: Results for orders placed or performed during the hospital encounter of 12/14/20  POC CBG monitoring  Result Value Ref Range   Glucose-Capillary 111 (H) 70 - 99 mg/dL   Labs Reviewed  CBG MONITORING, ED - Abnormal; Notable for the following components:      Result Value   Glucose-Capillary 111 (*)    All other components within normal limits   Allergies  Allergen Reactions   Bee Venom Anaphylaxis and Hives   Shellfish Allergy Anaphylaxis and Hives   Statins Other (See Comments)    Myalgias. Tolerating livalo. Pain    Statins    Testosterone Cypionate     Testerone Injection --Increased breast tissue     Past Medical History:  Diagnosis Date   Alcohol dependence ([place])    Allergies    Arthritis    Chest pain    Chronic lower back pain    Chronic pain of right wrist    Coronary artery disease    a. Multiple prior caths/PCI. Cath 2013 with possible spasm of RCA, 70% ISR of mid LCx with subsequent DES to mLCx and prox LCX. b. H/o microvascular angina. c. Recurrent angina 08/2014 - s/p PTCA/DES to prox Cx, PTCA/CBA to OM1.  c. LHC 06/10/15 with patent stents and some ISR in LCX and OM-1 that was not flow limiting --> Rx  Dyslipidemia    a. Intolerant to many statins except tolerating Livalo.   GERD (gastroesophageal reflux disease)    H/O cardiac catheterization 10/25/2018   Heart attack [facility])    Hypertension    Myocardial infarction ([place]) ~ 2010   S/P angioplasty with stent, DES, to proximal and mid LCX 12/15/11 12/15/2011   S/P foot surgery, right 04/2021   Shoulder pain    Stroke ([place])    pt. reports had a stroke around time of MI 2010   Type II diabetes mellitus ([place])    Unstable angina ([name])    Social History   Socioeconomic History   Marital status: Unknown    Spouse name: Not on file   Number of children: 2   Years of education: GED   Highest education level: Not on file  Occupational History   Not on file   Tobacco Use   Smoking status: Former    Packs/day: 1.00    Years: 10.00    Pack years: 10.00    Types: Cigarettes    Quit date: 10/07/2018    Years since quitting: 2.1   Smokeless tobacco: Never  Vaping Use   Vaping Use: Never used  Substance and Sexual Activity   Alcohol use: Yes    Comment: 2 bottles of wine on 12/08/2019 (one occurrence)   Drug use: Yes    Types: Cocaine    Comment: last use several months ago   Sexual activity: Not Currently  Other Topics Concern   Not on file  Social History Narrative   ** Merged History Encounter **       Social Determinants of Health   Financial Resource Strain: Not on file  Food Insecurity: Not on file  Transportation Needs: Not on file  Physical Activity: Not on file  Stress: Not on file  Social Connections: Not on file  Intimate Partner Violence: Not on file   Family History  Problem Relation Age of Onset   Leukemia Mother    Prostate cancer Father    Coronary artery disease Paternal Grandmother    Cancer Paternal Grandfather    Cancer Brother    Past Surgical History:  Procedure Laterality Date   CARDIAC CATHETERIZATION  06/15/2002   LAD with prox 40% stenosis, norma L main, Cfx with 25% lesion, RCA with long mid 25% stenosis (Dr. Vita Barley)   CARDIAC CATHETERIZATION  04/01/2010   normal L main, LAD wit mild stenosis, L Cfx with 70% in-stent restenosis, RCA with 70% in-stent restenosis, LVEF >60% (Dr. K. Mali Hilty) - cutting ballon arthrectomy to RCA & Cfx (Dr. Rockne Menghini)   CARDIAC CATHETERIZATION  08/25/2010   preserved global LV contractility; multivessel CAD, diffuse 90-95% in-stent restenosis in prox placed Cfx stent - cutting balloon arthrectomy in Cfx with multiple dilatations 90-95% to 0% stenosis (Dr. Corky Downs)   CARDIAC CATHETERIZATION  01/26/2011   PCI & stenting of aggresive in-stent restenosis within previously stented AV groove Cfx with 3.0x63m Taxus DES (previous stents were Promus) (Dr. JAdora Fridge    CARDIAC CATHETERIZATION  05/11/2011   preserved LV function, 40% mid LAD stenosis, 30-40% narrowing proximal to stented semgnet of prox Cfx, patent mid RCA stent with smooth 20% narrowing in distal RCA (Dr. TCorky Downs   CARDIAC CATHETERIZATION  12/15/2011   PCI & stenting of proximal & mid Cfx with DES - 3.0x192min proximal, 3.0x1580mn mid (Dr. J. Adora Fridge CARDIAC CATHETERIZATION N/A 06/10/2015   Procedure: Left Heart Cath and Coronary Angiography;  Surgeon: Jolaine Artist, MD;  Location: [place] [facility];  Service: Cardiovascular;  Laterality: N/A;   CARDIAC CATHETERIZATION  35/36/1443   cardiometabolic testing  1/54/0086   good exercise effort, peak VO2 79% predicted with normal VO2 HR curves (mild deconditioning)   COLONOSCOPY  12/2012   diminutive hyperplastic sigmoid poyp so repeat routine 2024   CORONARY BALLOON ANGIOPLASTY N/A 10/25/2018   Procedure: CORONARY BALLOON ANGIOPLASTY;  Surgeon: Jettie Booze, MD;  Location: [place] [facility];  Service: Cardiovascular;  Laterality: N/A;   CORONARY BALLOON ANGIOPLASTY N/A 09/29/2019   Procedure: CORONARY BALLOON ANGIOPLASTY;  Surgeon: Nelva Bush, MD;  Location: [place] [facility];  Service: Cardiovascular;  Laterality: N/A;   EXCISIONAL HEMORRHOIDECTOMY  1984   INTRAVASCULAR ULTRASOUND/IVUS N/A 09/29/2019   Procedure: Intravascular Ultrasound/IVUS;  Surgeon: Nelva Bush, MD;  Location: [place] [facility];  Service: Cardiovascular;  Laterality: N/A;   LEFT HEART CATH AND CORONARY ANGIOGRAPHY N/A 10/25/2018   Procedure: LEFT HEART CATH AND CORONARY ANGIOGRAPHY;  Surgeon: Jettie Booze, MD;  Location: [place] [facility];  Service: Cardiovascular;  Laterality: N/A;   LEFT HEART CATH AND CORONARY ANGIOGRAPHY N/A 09/29/2019   Procedure: LEFT HEART CATH AND CORONARY ANGIOGRAPHY;  Surgeon: Nelva Bush, MD;  Location: [place] [facility];  Service: Cardiovascular;  Laterality: N/A;   LEFT HEART CATHETERIZATION WITH CORONARY  ANGIOGRAM N/A 05/11/2011   Procedure: LEFT HEART CATHETERIZATION WITH CORONARY ANGIOGRAM;  Surgeon: Troy Sine, MD;  Location: [facility] CATH LAB;  Service: Cardiovascular;  Laterality: N/A;  Possible percutaneous coronary intervention, possible IVUS   LEFT HEART CATHETERIZATION WITH CORONARY ANGIOGRAM N/A 12/15/2011   Procedure: LEFT HEART CATHETERIZATION WITH CORONARY ANGIOGRAM;  Surgeon: Lorretta Harp, MD;  Location: [facility] CATH LAB;  Service: Cardiovascular;  Laterality: N/A;   LEFT HEART CATHETERIZATION WITH CORONARY ANGIOGRAM N/A 09/05/2014   Procedure: LEFT HEART CATHETERIZATION WITH CORONARY ANGIOGRAM;  Surgeon: Burnell Blanks, MD;  Location: [facility] CATH LAB;  Service: Cardiovascular;  Laterality: N/A;   LIPOMA EXCISION     back of the head   NM MYOCAR PERF WALL MOTION  02/2012   lexiscan myoview; mild perfusion defect in mid inferolateral & basal inferolateral region (infarct/scar); EF 52%, abnormal but ow risk scan   PERCUTANEOUS CORONARY STENT INTERVENTION (PCI-S)  09/05/2014   Procedure: PERCUTANEOUS CORONARY STENT INTERVENTION (PCI-S);  Surgeon: Burnell Blanks, MD;  Location: [facility] C[facility] LAB;  Service: Cardiovascular;;   Evonnie Dawes, MD 12/14/20 1112

## 2021-01-08 ENCOUNTER — Other Ambulatory Visit: Payer: Self-pay | Admitting: Nurse Practitioner

## 2021-01-08 ENCOUNTER — Telehealth: Payer: Self-pay

## 2021-01-08 MED ORDER — ACCU-CHEK GUIDE VI STRP: ORAL_STRIP | 12 refills | Status: DC

## 2021-01-08 NOTE — Telephone Encounter (Signed)
Patient came into office, message sent to provider.

## 2021-01-08 NOTE — Telephone Encounter (Signed)
Accu check Guide me Test scripes 90 day supply  B-12 Vitamin 90 day supply

## 2021-01-12 ENCOUNTER — Other Ambulatory Visit: Payer: Self-pay

## 2021-01-12 ENCOUNTER — Encounter (HOSPITAL_COMMUNITY): Payer: Self-pay

## 2021-01-12 ENCOUNTER — Ambulatory Visit (HOSPITAL_COMMUNITY)
Admission: RE | Admit: 2021-01-12 | Discharge: 2021-01-12 | Disposition: A | Discharge status: Home / Self Care | Payer: Medicaid Other | Source: Ambulatory Visit | Attending: Student | Admitting: Student

## 2021-01-12 VITALS — BP 127/87 | HR 54 | Temp 97.6°F | Resp 20

## 2021-01-12 DIAGNOSIS — J301 Allergic rhinitis due to pollen: Secondary | ICD-10-CM | POA: Diagnosis not present

## 2021-01-12 DIAGNOSIS — Z7984 Long term (current) use of oral hypoglycemic drugs: Secondary | ICD-10-CM | POA: Diagnosis not present

## 2021-01-12 DIAGNOSIS — E1142 Type 2 diabetes mellitus with diabetic polyneuropathy: Secondary | ICD-10-CM

## 2021-01-12 DIAGNOSIS — E119 Type 2 diabetes mellitus without complications: Secondary | ICD-10-CM

## 2021-01-12 DIAGNOSIS — M722 Plantar fascial fibromatosis: Secondary | ICD-10-CM

## 2021-01-12 MED ORDER — CETIRIZINE HCL 10 MG PO TABS: 10.0000 mg | ORAL_TABLET | Freq: Every day | ORAL | 0 refills | Status: DC

## 2021-01-12 NOTE — ED Provider Notes (Signed)
[place]    CSN: [id number] Arrival date & time: 01/12/21  1139      History   Chief Complaint Chief Complaint  Patient presents with   Appointment    12:00   Foot Pain   Otalgia    HPI [name] is a 60 y.o. male presenting with multiple complaints: plantar fasciitis exacerbation, L ear pain x3 weeks, concern for low testosterone.  States that his primary care was not able to see him, so he presented to this urgent care today.  Medical history diabetes, Planter fasciitis, diabetic neuropathy, CAD, arthritis. -Endorses pain under the soles of both feet for 1.5 months, consistent with his plantar fasciitis symptoms.  States pain is worse in the morning, and elicited with standing and walking.  Denies any trauma or overuse, but states he did get new diabetic shoes 1.5 months ago.  Denies new sensation changes in his lower extremities. -L ear itching x3 weeks, endorses allergic rhinitis. Denies recent URI, hearing changes, dizziness, tinnitus, headaches, fevers/chills.  -Concern for low testosterone given recent fatigue. Understands that we don't test or treat tesosterone here.  HPI  Past Medical History:  Diagnosis Date   Alcohol dependence ([place])    Allergies    Arthritis    Chest pain    Chronic lower back pain    Chronic pain of right wrist    Coronary artery disease    a. Multiple prior caths/PCI. Cath 2013 with possible spasm of RCA, 70% ISR of mid LCx with subsequent DES to mLCx and prox LCX. b. H/o microvascular angina. c. Recurrent angina 08/2014 - s/p PTCA/DES to prox Cx, PTCA/CBA to OM1.  c. LHC 06/10/15 with patent stents and some ISR in LCX and OM-1 that was not flow limiting --> Rx    Dyslipidemia    a. Intolerant to many statins except tolerating Livalo.   GERD (gastroesophageal reflux disease)    H/O cardiac catheterization 10/25/2018   Heart attack [facility])    Hypertension    Myocardial infarction ([place]) ~ 2010   S/P angioplasty with stent, DES,  to proximal and mid LCX 12/15/11 12/15/2011   S/P foot surgery, right 04/2021   Shoulder pain    Stroke [facility])    pt. reports had a stroke around time of MI 2010   Type II diabetes mellitus ([place])    Unstable angina [facility])     Patient Active Problem List   Diagnosis Date Noted   Other allergic rhinitis 09/09/2020   Adverse reaction to food, subsequent encounter 09/09/2020   History of bee sting allergy 09/09/2020   Alcohol dependence with alcohol-induced mood disorder ([place]) 03/19/2020   MDD (major depressive disorder), recurrent episode, severe ([place]) 03/18/2020   History of substance abuse ([place]) 01/04/2020   Severe 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 features ([place]) 12/10/2019   Tinea cruris 07/06/2019   Hemoglobin A1c less than 7.0% 07/06/2019   Muscle strain of wrist 04/05/2019   Pain in both wrists 04/05/2019   Anxiety 04/05/2019   Prediabetes 04/05/2019   MDD (major depressive disorder) 01/12/2019   Chest pain 10/25/2018   MDD (major depressive disorder), recurrent severe, without psychosis ([place]) 08/15/2018   Influenza A 07/17/2017   Lactic acidosis    Cough    Nausea and vomiting    Severe episode of recurrent major depressive disorder, without psychotic features ([place])    MDD (major depressive disorder), single episode, severe with psychotic features ([place]) 06/27/2017   MDD (major depressive disorder), severe ([place])  06/23/2017   Essential hypertension 01/13/2016   Unstable angina [facility])    ED (erectile dysfunction) 02/15/2014   Atypical chest pain 12/14/2011   Allergy to contrast media (used for diagnostic x-rays) 05/12/2011   Dyslipidemia, goal LDL below 70 05/09/2011   DM2 (diabetes mellitus, type 2) ([place]) 07/10/2010   CAD S/P percutaneous coronary angioplasty 07/10/2010    Past Surgical History:  Procedure Laterality Date   CARDIAC CATHETERIZATION  06/15/2002   LAD with prox 40% stenosis, norma L main, Cfx with 25% lesion, RCA with long mid 25% stenosis (Dr. Vita Barley)   CARDIAC CATHETERIZATION  04/01/2010   normal L main, LAD wit mild stenosis, L Cfx with 70% in-stent restenosis, RCA with 70% in-stent restenosis, LVEF >60% (Dr. K. Mali Hilty) - cutting ballon arthrectomy to RCA & Cfx (Dr. Rockne Menghini)   CARDIAC CATHETERIZATION  08/25/2010   preserved global LV contractility; multivessel CAD, diffuse 90-95% in-stent restenosis in prox placed Cfx stent - cutting balloon arthrectomy in Cfx with multiple dilatations 90-95% to 0% stenosis (Dr. Corky Downs)   CARDIAC CATHETERIZATION  01/26/2011   PCI & stenting of aggresive in-stent restenosis within previously stented AV groove Cfx with 3.0x33m Taxus DES (previous stents were Promus) (Dr. JAdora Fridge   CARDIAC CATHETERIZATION  05/11/2011   preserved LV function, 40% mid LAD stenosis, 30-40% narrowing proximal to stented semgnet of prox Cfx, patent mid RCA stent with smooth 20% narrowing in distal RCA (Dr. TCorky Downs   CARDIAC CATHETERIZATION  12/15/2011   PCI & stenting of proximal & mid Cfx with DES - 3.0x13min proximal, 3.0x1572mn mid (Dr. J. Adora Fridge [place] 06/10/2015   Procedure: Left Heart Cath and Coronary Angiography;  Surgeon: DanJolaine ArtistD;  Location: [facility] [place] [facility];  Service: Cardiovascular;  Laterality: N/A;   CARDIAC CATHETERIZATION  10/08/55/2620cardiometabolic testing  08/11/53/9741good exercise effort, peak VO2 79% predicted with normal VO2 HR curves (mild deconditioning)   COLONOSCOPY  12/2012   diminutive hyperplastic sigmoid poyp so repeat routine 2024   CORONARY BALLOON ANGIOPLASTY N/A 10/25/2018   Procedure: CORONARY BALLOON ANGIOPLASTY;  Surgeon: VarJettie BoozeD;  Location: [facility] [place] [facility];  Service: Cardiovascular;  Laterality: N/A;   CORONARY BALLOON ANGIOPLASTY N/A 09/29/2019   Procedure: CORONARY BALLOON ANGIOPLASTY;  Surgeon: EndNelva BushD;  Location: [facility] [place] [facility];  Service: Cardiovascular;  Laterality: N/A;   EXCISIONAL  HEMORRHOIDECTOMY  1984   INTRAVASCULAR ULTRASOUND/IVUS N/A 09/29/2019   Procedure: Intravascular Ultrasound/IVUS;  Surgeon: EndNelva BushD;  Location: [facility] [place] [facility];  Service: Cardiovascular;  Laterality: N/A;   LEFT HEART CATH AND CORONARY ANGIOGRAPHY N/A 10/25/2018   Procedure: LEFT HEART CATH AND CORONARY ANGIOGRAPHY;  Surgeon: VarJettie BoozeD;  Location: [facility] [place] [facility];  Service: Cardiovascular;  Laterality: N/A;   LEFT HEART CATH AND CORONARY ANGIOGRAPHY N/A 09/29/2019   Procedure: LEFT HEART CATH AND CORONARY ANGIOGRAPHY;  Surgeon: EndNelva BushD;  Location: [facility] [place] [facility];  Service: Cardiovascular;  Laterality: N/A;   LEFT HEART CATHETERIZATION WITH CORONARY ANGIOGRAM N/A 05/11/2011   Procedure: LEFT HEART CATHETERIZATION WITH CORONARY ANGIOGRAM;  Surgeon: ThoTroy SineD;  [facility];  Service: Cardiovascular;  Laterality: N/A;  Possible percutaneous coronary intervention, possible IVUS   LEFT HEART CATHETERIZATION WITH CORONARY ANGIOGRAM N/A 12/15/2011   Procedure: LEFT HEART CATHETERIZATION WITH CORONARY ANGIOGRAM;  Surgeon: JonLorretta HarpD;  [facility];  Service:  Cardiovascular;  Laterality: N/A;   LEFT HEART CATHETERIZATION WITH CORONARY ANGIOGRAM N/A 09/05/2014   Procedure: LEFT HEART CATHETERIZATION WITH CORONARY ANGIOGRAM;  Surgeon: Burnell Blanks, MD;  Location: [facility] CATH LAB;  Service: Cardiovascular;  Laterality: N/A;   LIPOMA EXCISION     back of the head   NM MYOCAR PERF WALL MOTION  02/2012   lexiscan myoview; mild perfusion defect in mid inferolateral & basal inferolateral region (infarct/scar); EF 52%, abnormal but ow risk scan   PERCUTANEOUS CORONARY STENT INTERVENTION (PCI-S)  09/05/2014   Procedure: PERCUTANEOUS CORONARY STENT INTERVENTION (PCI-S);  Surgeon: Burnell Blanks, MD;  Location: [facility] CATH LAB;  Service: Cardiovascular;;   TONSILLECTOMY         Home Medications    Prior to Admission  medications   Medication Sig Start Date End Date Taking? Authorizing Provider  amLODipine (NORVASC) 10 MG tablet Take 1 tablet (10 mg total) by mouth daily. 05/07/20  Yes Pixie Casino, MD  aspirin EC 81 MG EC tablet Take 1 tablet (81 mg total) by mouth daily. (May buy from over the counter): Swallow whole for heart health 03/22/20  Yes Lindell Spar I, NP  cetirizine (ZYRTEC ALLERGY) 10 MG tablet Take 1 tablet (10 mg total) by mouth daily. 01/12/21  Yes Hazel Sams, PA-C  LIVALO 4 MG TABS Take 1 tablet (4 mg total) by mouth daily. 11/22/20 11/22/21 Yes Hilty, Nadean Corwin, MD  metFORMIN (GLUCOPHAGE) 500 MG tablet TAKE 1 TABLET BY MOUTH TWICE DAILY WITH A MEAL 10/04/19  Yes Azzie Glatter, FNP  metoprolol tartrate (LOPRESSOR) 100 MG tablet Take 1 tablet (100 mg total) by mouth 2 (two) times daily. 09/30/20  Yes Azzie Glatter, FNP  blood glucose meter kit and supplies KIT Dispense based on patient and insurance preference. Use up to four times daily as directed. (FOR ICD-9 250.00, 250.01). 12/14/20   Vanessa Kick, MD  EPINEPHrine 0.3 mg/0.3 mL IJ SOAJ injection Inject 0.3 mg into the muscle as needed for anaphylaxis. 09/09/20   Garnet Sierras, DO  fluconazole (DIFLUCAN) 150 MG tablet Take 1 tablet weekly X 4 weeks. Patient not taking: Reported on 01/12/2021 10/31/20   Vevelyn Francois, NP  fluticasone [facility]) 50 MCG/ACT nasal spray Place 1 spray into both nostrils 2 (two) times daily as needed. Patient not taking: Reported on 01/12/2021 09/09/20   Garnet Sierras, DO  glucose blood (ACCU-CHEK GUIDE) test strip Use as instructed 01/08/21   Vevelyn Francois, NP  nitroGLYCERIN (NITROSTAT) 0.4 MG SL tablet Place 1 tablet (0.4 mg total) under the tongue every 5 (five) minutes x 3 doses as needed for chest pain. 05/07/20   Hilty, Nadean Corwin, MD  SF 5000 PLUS 1.1 % CREA dental cream See admin instructions. 08/26/20   [provider]  ticagrelor (BRILINTA) 90 MG TABS tablet Take 1 tablet (90 mg total) by mouth 2  (two) times daily. For cardiac issues 03/21/20   Encarnacion Slates, NP    Family History Family History  Problem Relation Age of Onset   Leukemia Mother    Prostate cancer Father    Coronary artery disease Paternal Grandmother    Cancer Paternal Grandfather    Cancer Brother     Social History Social History   Tobacco Use   Smoking status: Former    Packs/day: 1.00    Years: 10.00    Pack years: 10.00    Types: Cigarettes    Quit date: 10/07/2018  Years since quitting: 2.2   Smokeless tobacco: Never  Vaping Use   Vaping Use: Never used  Substance Use Topics   Alcohol use: Not Currently    Comment: 2 bottles of wine on 12/08/2019 (one occurrence)   Drug use: Not Currently    Types: Cocaine    Comment: last use several months ago     Allergies   Bee venom, Shellfish allergy, Statins, Statins, and Testosterone cypionate   Review of Systems Review of Systems  Constitutional:  Negative for appetite change, chills and fever.  HENT:  Negative for congestion, ear pain, rhinorrhea, sinus pressure, sinus pain and sore throat.   Eyes:  Negative for redness and visual disturbance.  Respiratory:  Negative for cough, chest tightness, shortness of breath and wheezing.   Cardiovascular:  Negative for chest pain and palpitations.  Gastrointestinal:  Negative for abdominal pain, constipation, diarrhea, nausea and vomiting.  Genitourinary:  Negative for dysuria, frequency and urgency.  Musculoskeletal:  Negative for myalgias.       Pain soles bilateral feet  Neurological:  Negative for dizziness, weakness and headaches.  Psychiatric/Behavioral:  Negative for confusion.   All other systems reviewed and are negative.   Physical Exam Triage Vital Signs ED Triage Vitals  Enc Vitals Group     BP 01/12/21 1219 127/87     Pulse Rate 01/12/21 1219 (!) 54     Resp 01/12/21 1219 20     Temp 01/12/21 1219 97.6 F (36.4 C)     Temp Source 01/12/21 1219 Oral     SpO2 01/12/21 1219 96 %      Weight --      Height --      Head Circumference --      Peak Flow --      Pain Score 01/12/21 1215 1     Pain Loc --      Pain Edu? --      Excl. in [place]? --    No data found.  Updated Vital Signs BP 127/87 (BP Location: Left Arm)   Pulse (!) 54   Temp 97.6 F (36.4 C) (Oral)   Resp 20   SpO2 96%   Visual Acuity Right Eye Distance:   Left Eye Distance:   Bilateral Distance:    Right Eye Near:   Left Eye Near:    Bilateral Near:     Physical Exam Vitals reviewed.  Constitutional:      Appearance: Normal appearance. He is not ill-appearing.  HENT:     Head: Normocephalic and atraumatic.     Right Ear: Hearing, tympanic membrane, ear canal and external ear normal. No swelling or tenderness. No middle ear effusion. There is no impacted cerumen. No mastoid tenderness. Tympanic membrane is not injected, scarred, perforated, erythematous, retracted or bulging.     Left Ear: Hearing, tympanic membrane, ear canal and external ear normal. No swelling or tenderness.  No middle ear effusion. There is no impacted cerumen. No mastoid tenderness. Tympanic membrane is not injected, scarred, perforated, erythematous, retracted or bulging.     Ears:     Comments: Dry scaling skin L external canal    Mouth/Throat:     Pharynx: Oropharynx is clear. No oropharyngeal exudate or posterior oropharyngeal erythema.  Cardiovascular:     Rate and Rhythm: Normal rate and regular rhythm.     Heart sounds: Normal heart sounds.  Pulmonary:     Effort: Pulmonary effort is normal.     Breath sounds: Normal breath  sounds.  Musculoskeletal:     Comments: TTP bilateral soles of feet. No bony tenderness. No malleolar tenderness. No effusion. Sensation intact. Cap refill <2 seconds, DP 2+.   Lymphadenopathy:     Cervical: No cervical adenopathy.  Neurological:     General: No focal deficit present.     Mental Status: He is alert and oriented to person, place, and time.  Psychiatric:        Mood  and Affect: Mood normal.        Behavior: Behavior normal.        Thought Content: Thought content normal.        Judgment: Judgment normal.     UC Treatments / Results  Labs (all labs ordered are listed, but only abnormal results are displayed) Labs Reviewed - No data to display  EKG   Radiology No results found.  Procedures Procedures (including critical care time)  Medications Ordered in UC Medications - No data to display  Initial Impression / Assessment and Plan / UC Course  I have reviewed the triage vital signs and the nursing notes.  Pertinent labs & imaging results that were available during my care of the patient were reviewed by me and considered in my medical decision making (see chart for details).     This patient is a very pleasant 60 y.o. year old male presenting with plantar fasciitis and dry scaly skin L external auditory canal. Rec trying different shoes. Trial of zyrtec. F/u with PCP. ED return precautions discussed. Patient verbalizes understanding and agreement.    Final Clinical Impressions(s) / UC Diagnoses   Final diagnoses:  Plantar fasciitis  Type 2 diabetes mellitus with diabetic polyneuropathy, without long-term current use of insulin ([name])  Diabetes mellitus treated with oral medication ([place])  Seasonal allergic rhinitis due to pollen     Discharge Instructions      -Try zyrtec once a day x1 week, continue for longer if it's helping -Explore different insole options for your shoes -Follow-up with PCP to discuss checking testosterone      ED Prescriptions     Medication Sig Dispense Auth. Provider   cetirizine (ZYRTEC ALLERGY) 10 MG tablet Take 1 tablet (10 mg total) by mouth daily. 30 tablet Hazel Sams, PA-C      PDMP not reviewed this encounter.   Hazel Sams, PA-C 01/12/21 1258

## 2021-01-12 NOTE — ED Triage Notes (Addendum)
Complains of both feet hurting for 1 1/2 months ago.  Patient has pain in instep of both feet.   Patient concerned for low energy, low testosterone is concern.    Complains of left ear pain  Patient reports he is unable to get seen at pcp, told to come to ucc.

## 2021-01-12 NOTE — Discharge Instructions (Addendum)
-  Try zyrtec once a day x1 week, continue for longer if it's helping -Explore different insole options for your shoes -Follow-up with PCP to discuss checking testosterone

## 2021-01-20 ENCOUNTER — Telehealth (INDEPENDENT_AMBULATORY_CARE_PROVIDER_SITE_OTHER): Payer: Medicaid Other | Admitting: Nurse Practitioner

## 2021-01-20 ENCOUNTER — Ambulatory Visit (INDEPENDENT_AMBULATORY_CARE_PROVIDER_SITE_OTHER): Payer: Medicaid Other | Admitting: Podiatry

## 2021-01-20 ENCOUNTER — Encounter: Payer: Self-pay | Admitting: Nurse Practitioner

## 2021-01-20 ENCOUNTER — Encounter: Payer: Self-pay | Admitting: Podiatry

## 2021-01-20 ENCOUNTER — Other Ambulatory Visit: Payer: Self-pay

## 2021-01-20 DIAGNOSIS — M722 Plantar fascial fibromatosis: Secondary | ICD-10-CM | POA: Diagnosis not present

## 2021-01-20 DIAGNOSIS — R5383 Other fatigue: Secondary | ICD-10-CM

## 2021-01-20 DIAGNOSIS — Z720 Tobacco use: Secondary | ICD-10-CM

## 2021-01-20 MED ORDER — NICOTINE 21 MG/24HR TD PT24: 21.0000 mg | MEDICATED_PATCH | Freq: Every day | TRANSDERMAL | 1 refills | Status: DC

## 2021-01-20 NOTE — Progress Notes (Signed)
Subjective:   Patient ID: [name], male   DOB: 60 y.o.   MRN: [id number]   HPI Patient presents stating that he forgot well with his bunion but is having pain in both arches and its been hard for him to walk comfortably and its been going on for around a month   ROS      Objective:  Physical Exam  Neurovascular status intact with exquisite discomfort in the mid arch area bilateral with inflammation fluid buildup associated with it     Assessment:  Acute mid arch Planter fasciitis bilateral     Plan:  H&P reviewed condition sterile prep injected the mid arch bilateral 3 mg Kenalog 5 mg Xylocaine advised on anti-inflammatory support therapy reappoint to recheck

## 2021-01-20 NOTE — Progress Notes (Signed)
   [place], [place]  [address] Phone:  [phone]   Fax:  [phone] Virtual Visit via Telephone Note  I connected with Lawson Fiscal on 01/20/21 at  3:00 PM EDT by telephone and verified that I am speaking with the correct person using two identifiers.   I discussed the limitations, risks, security and privacy concerns of performing an evaluation and management service by telephone and the availability of in person appointments. I also discussed with the patient that there may be a patient responsible charge related to this service. The patient expressed understanding and agreed to proceed.  Patient home Provider Office  History of Present Illness:  JUD FABREGAS  has a past medical history of Alcohol dependence ([place]), Allergies, Arthritis, Chest pain, Chronic lower back pain, Chronic pain of right wrist, Coronary artery disease, Dyslipidemia, GERD (gastroesophageal reflux disease), H/O cardiac catheterization (10/25/2018), Heart attack ([place]), Hypertension, Myocardial infarction ([place]) (~ 2010), S/P angioplasty with stent, DES, to proximal and mid LCX 12/15/11 (12/15/2011), S/P foot surgery, right (04/2021), Shoulder pain, Stroke ([place]), Type II diabetes mellitus ([place]), and Unstable angina ([place]).   He feels like his low level energy. He has low testosterone. He was treated and his energy level improved.  He is unsure why this was discontinued. He is 2 months sober and is grateful for the current program.    ROS   Observations/Objective: No exam; telephone visit    Assessment & Plan:   Problem List Items Addressed This Visit   None Visit Diagnoses     Fatigue, unspecified type    -  Primary Labs pending   Relevant Orders   Magnesium   Testosterone   Vitamin B12   VITAMIN D 25 Hydroxy (Vit-D Deficiency, Fractures)   Tobacco use     Nicoderm patch daily        Meds ordered this encounter  Medications   nicotine (NICODERM CQ)  21 mg/24hr patch    Sig: Place 1 patch (21 mg total) onto the skin daily.    Dispense:  28 patch    Refill:  1    Order Specific Question:   Supervising Provider    Answer:   Tresa Garter [id number]     Vevelyn Francois, NP  Follow Up Instructions: Apt already scheduled   I discussed the assessment and treatment plan with the patient. The patient was provided an opportunity to ask questions and all were answered. The patient agreed with the plan and demonstrated an understanding of the instructions.   The patient was advised to call back or seek an in-person evaluation if the symptoms worsen or if the condition fails to improve as anticipated.  I provided 10:18 minutes of telephone- visit time during this encounter.   Vevelyn Francois, NP

## 2021-01-20 NOTE — Patient Instructions (Signed)

## 2021-01-21 ENCOUNTER — Other Ambulatory Visit: Payer: Self-pay

## 2021-01-21 ENCOUNTER — Other Ambulatory Visit: Payer: Medicaid Other

## 2021-01-21 DIAGNOSIS — E1159 Type 2 diabetes mellitus with other circulatory complications: Secondary | ICD-10-CM

## 2021-01-21 DIAGNOSIS — R5383 Other fatigue: Secondary | ICD-10-CM

## 2021-01-21 MED ORDER — METFORMIN HCL 500 MG PO TABS
ORAL_TABLET | ORAL | 2 refills | Status: DC
Start: 2021-01-21 — End: 2021-07-10

## 2021-01-21 NOTE — Progress Notes (Unsigned)
Patient in for lab results and vital signs.

## 2021-01-22 ENCOUNTER — Other Ambulatory Visit (HOSPITAL_COMMUNITY): Payer: Self-pay | Admitting: Nurse Practitioner

## 2021-01-22 LAB — TESTOSTERONE: Testosterone: 140 ng/dL — ABNORMAL LOW (ref 264–916)

## 2021-01-22 LAB — VITAMIN B12: Vitamin B-12: 537 pg/mL (ref 232–1245)

## 2021-01-22 LAB — MAGNESIUM: Magnesium: 1.9 mg/dL (ref 1.6–2.3)

## 2021-01-22 LAB — VITAMIN D 25 HYDROXY (VIT D DEFICIENCY, FRACTURES): Vit D, 25-Hydroxy: 44.9 ng/mL (ref 30.0–100.0)

## 2021-01-22 MED ORDER — TESTOSTERONE 20.25 MG/ACT (1.62%) TD GEL: 1.0000 | Freq: Every day | TRANSDERMAL | 2 refills | Status: DC

## 2021-01-22 NOTE — Progress Notes (Signed)
   [facility] [address] [place], [place]  [address] Phone:  [phone]   Fax:  [phone] 

## 2021-01-28 ENCOUNTER — Telehealth: Payer: Self-pay | Admitting: Internal Medicine

## 2021-01-28 NOTE — Telephone Encounter (Signed)
Pt c/o medication issue:  1. Name of Medication: ticagrelor (BRILINTA) 90 MG TABS tablet  2. How are you currently taking this medication (dosage and times per day)? 1 tablet by mouth 2 times a day  3. Are you having a reaction (difficulty breathing--STAT)? no  4. What is your medication issue? Patient wants to talk about coming off of the medication.

## 2021-01-28 NOTE — Telephone Encounter (Signed)
Pt is returning call.  

## 2021-01-28 NOTE — Telephone Encounter (Signed)
Left message to call back  

## 2021-01-28 NOTE — Telephone Encounter (Signed)
Spoke to patient he stated he wanted to ask Dr.Hilty when he can stop taking Brilinta.Advised Dr.Hilty is out of office this week.Advised I will send message to him.

## 2021-01-31 NOTE — Telephone Encounter (Signed)
Technically he could come off of Brilinta now, since his stent was last April - but he still has bad coronary disease - would like him to remain on DAPT possibly indefinitely. We would consider switching Brilinta to Plavix 75 mg daily and stay on aspirin 81 mg daily as well. Does not need to be loaded on Plavix after the switch.  Dr. Lemmie Evens

## 2021-01-31 NOTE — Telephone Encounter (Signed)
Called patient left message on personal voice mail to call back. 

## 2021-01-31 NOTE — Telephone Encounter (Signed)
Left voicemail to call back for an appointment.

## 2021-02-03 NOTE — Telephone Encounter (Signed)
Left message to call back. MyChart message sent with MD advice/recommendations

## 2021-02-04 MED ORDER — CLOPIDOGREL BISULFATE 75 MG PO TABS: 75.0000 mg | ORAL_TABLET | Freq: Every day | ORAL | 1 refills | Status: DC

## 2021-02-04 NOTE — Telephone Encounter (Signed)
Patient responded via MyChart message Rx(s) sent to pharmacy electronically.

## 2021-02-05 ENCOUNTER — Ambulatory Visit: Payer: Medicaid Other | Admitting: Nurse Practitioner

## 2021-02-12 ENCOUNTER — Ambulatory Visit: Payer: Medicaid Other | Admitting: Nurse Practitioner

## 2021-02-12 ENCOUNTER — Other Ambulatory Visit: Payer: Self-pay

## 2021-02-12 ENCOUNTER — Ambulatory Visit (INDEPENDENT_AMBULATORY_CARE_PROVIDER_SITE_OTHER): Payer: Medicaid Other | Admitting: Nurse Practitioner

## 2021-02-12 ENCOUNTER — Encounter: Payer: Self-pay | Admitting: Nurse Practitioner

## 2021-02-12 VITALS — BP 129/86 | HR 58 | Temp 97.2°F | Ht 68.0 in | Wt 189.0 lb

## 2021-02-12 DIAGNOSIS — E1159 Type 2 diabetes mellitus with other circulatory complications: Secondary | ICD-10-CM

## 2021-02-12 DIAGNOSIS — R42 Dizziness and giddiness: Secondary | ICD-10-CM

## 2021-02-12 DIAGNOSIS — E785 Hyperlipidemia, unspecified: Secondary | ICD-10-CM | POA: Diagnosis not present

## 2021-02-12 DIAGNOSIS — R7309 Other abnormal glucose: Secondary | ICD-10-CM

## 2021-02-12 DIAGNOSIS — R001 Bradycardia, unspecified: Secondary | ICD-10-CM | POA: Diagnosis not present

## 2021-02-12 DIAGNOSIS — I10 Essential (primary) hypertension: Secondary | ICD-10-CM

## 2021-02-12 LAB — POCT URINALYSIS DIPSTICK
Bilirubin, UA: NEGATIVE
Blood, UA: NEGATIVE
Glucose, UA: NEGATIVE
Ketones, UA: NEGATIVE
Leukocytes, UA: NEGATIVE
Nitrite, UA: NEGATIVE
Protein, UA: NEGATIVE
Spec Grav, UA: <=1.005 — AB (ref 1.010–1.025)
Urobilinogen, UA: 0.2 E.U./dL
pH, UA: 6.5 (ref 5.0–8.0)

## 2021-02-12 LAB — POCT GLYCOSYLATED HEMOGLOBIN (HGB A1C): Hemoglobin A1C: 6.2 % — AB (ref 4.0–5.6)

## 2021-02-12 MED ORDER — BLOOD PRESSURE MONITOR KIT: 1.0000 | PACK | Freq: Every day | 0 refills | Status: DC

## 2021-02-12 NOTE — Patient Instructions (Signed)
Tension Headache, Adult ?A tension headache is a feeling of pain, pressure, or aching over the front and sides of the head. The pain can be dull, or it can feel tight. There are two types of tension headache: ?Episodic tension headache. This is when the headaches happen fewer than 15 days a month. ?Chronic tension headache. This is when the headaches happen more than 15 days a month during a 3-month period. ?A tension headache can last from 30 minutes to several days. It is the most common kind of headache. Tension headaches are not normally associated with nausea or vomiting, and they do not get worse with physical activity. ?What are the causes? ?The exact cause of this condition is not known. Tension headaches are often triggered by stress, anxiety, or depression. Other triggers may include: ?Alcohol. ?Too much caffeine or caffeine withdrawal. ?Respiratory infections, such as colds, flu, or sinus infections. ?Dental problems or teeth clenching. ?Fatigue. ?Holding your head and neck in the same position for a long period of time, such as while using a computer. ?Smoking. ?Arthritis of the neck. ?What are the signs or symptoms? ?Symptoms of this condition include: ?A feeling of pressure or tightness around the head. ?Dull, aching head pain. ?Pain over the front and sides of the head. ?Tenderness in the muscles of the head, neck, and shoulders. ?How is this diagnosed? ?This condition may be diagnosed based on your symptoms, your medical history, and a physical exam. ?If your symptoms are severe or unusual, you may have imaging tests, such as a CT scan or an MRI of your head. Your vision may also be checked. ?How is this treated? ?This condition may be treated with lifestyle changes and with medicines that help relieve symptoms. ?Follow these instructions at home: ?Managing pain ?Take over-the-counter and prescription medicines only as told by your health care provider. ?When you have a headache, lie down in a dark,  quiet room. ?If directed, put ice on your head and neck. To do this: ?Put ice in a plastic bag. ?Place a towel between your skin and the bag. ?Leave the ice on for 20 minutes, 2-3 times a day. ?Remove the ice if your skin turns bright red. This is very important. If you cannot feel pain, heat, or cold, you have a greater risk of damage to the area. ?If directed, apply heat to the back of your neck as often as told by your health care provider. Use the heat source that your health care provider recommends, such as a moist heat pack or a heating pad. ?Place a towel between your skin and the heat source. ?Leave the heat on for 20-30 minutes. ?Remove the heat if your skin turns bright red. This is especially important if you are unable to feel pain, heat, or cold. You have a greater risk of getting burned. ?Eating and drinking ?Eat meals on a regular schedule. ?If you drink alcohol: ?Limit how much you have to: ?0-1 drink a day for women who are not pregnant. ?0-2 drinks a day for men. ?Know how much alcohol is in your drink. In the [place] one drink equals one 12 oz bottle of beer (355 mL), one 5 oz glass of wine (148 mL), or one 1? oz glass of hard liquor (44 mL). ?Drink enough fluid to keep your urine pale yellow. ?Decrease your caffeine intake, or stop using caffeine. ?Lifestyle ?Get 7-9 hours of sleep each night, or get the amount of sleep recommended by your health care provider. ?At bedtime,   remove computers, phones, and tablets from your room. ?Find ways to manage your stress. This may include: ?Exercise. ?Deep breathing exercises. ?Yoga. ?Listening to music. ?Positive mental imagery. ?Try to sit up straight and avoid tensing your muscles. ?Do not use any products that contain nicotine or tobacco. These include cigarettes, chewing tobacco, and vaping devices, such as e-cigarettes. If you need help quitting, ask your health care provider. ?General instructions ? ?Avoid any headache triggers. Keep a journal to help  find out what may trigger your headaches. For example, write down: ?What you eat and drink. ?How much sleep you get. ?Any change to your diet or medicines. ?Keep all follow-up visits. This is important. ?Contact a health care provider if: ?Your headache does not get better. ?Your headache comes back. ?You are sensitive to sounds, light, or smells because of a headache. ?You have nausea or you vomit. ?Your stomach hurts. ?Get help right away if: ?You suddenly develop a severe headache, along with any of the following: ?A stiff neck. ?Nausea and vomiting. ?Confusion. ?Weakness in one part or one side of your body. ?Double vision or loss of vision. ?Shortness of breath. ?Rash. ?Unusual sleepiness. ?Fever or chills. ?Trouble speaking. ?Pain in your eye or ear. ?Trouble walking or balancing. ?Feeling faint or passing out. ?Summary ?A tension headache is a feeling of pain, pressure, or aching over the front and sides of the head. ?A tension headache can last from 30 minutes to several days. It is the most common kind of headache. ?This condition may be diagnosed based on your symptoms, your medical history, and a physical exam. ?This condition may be treated with lifestyle changes and with medicines that help relieve symptoms. ?This information is not intended to replace advice given to you by your health care provider. Make sure you discuss any questions you have with your health care provider. ?Document Revised: 03/14/2020 Document Reviewed: 03/14/2020 ?Elsevier Patient Education ? 2022 [employer] Inc[employer] ? ?

## 2021-02-12 NOTE — Progress Notes (Signed)
[facility] [address] [place], [place]  [address] Phone:  [phone]   Fax:  [phone] .   Established Patient Office Visit  Subjective:  Patient ID: [name], male    DOB: 06-03-1961  Age: 60 y.o. MRN: [id number]  CC:  Chief Complaint  Patient presents with   Headache    2 months everyday, more intense in the morning. Lightheaded throughout the day    HPI [name] presents for follow up. He  has a past medical history of Alcohol dependence ([name]), Allergies, Arthritis, Chest pain, Chronic lower back pain, Chronic pain of right wrist, Coronary artery disease, Dyslipidemia, GERD (gastroesophageal reflux disease), H/O cardiac catheterization (10/25/2018), Heart attack ([name]), Hypertension, Myocardial infarction ([name]) (~ 2010), S/P angioplasty with stent, DES, to proximal and mid LCX 12/15/11 (12/15/2011), S/P foot surgery, right (04/2021), Shoulder pain, Stroke ([name]), Type II diabetes mellitus ([name]), and Unstable angina ([name]).   He is in today for evaluation of ongoing headaches.  He reports that every morning he has frontal lobe headache that last for about 40 to 45 minutes.  He rates it as 6-7 out of 10.  After the headache resolves without treatment he has feelings of lightheadedness.  He does report frequent sleep however he goes to bed between 9 and 10 PM and wakes up at 4 PM.  He is up 1-3 times each night to use the restroom.  He does not feel like this is a problem for him.  This is due to increased fluid intake.  He denies feeling dizzy.  He states that it feels like he smoked a cigarette for the first time.  He denies any recent injury.  He feels like the lightheadedness is progressing and is becoming somewhat debilitating.  He denies any visual changes, hearing problems.  He does have seasonal allergies he was treating it with Zyrtec however only uses occasionally nasal because not very symptomatic.  He denies nausea vomiting, increased stress,  shortness of breath, chest pain.  He reports that he did discontinue all alcohol use and tobacco use 3 months ago and has done well with this.  He does stay hydrated and exercises twice a day. He is concerned that his headaches may be tension headache.  Past Medical History:  Diagnosis Date   Alcohol dependence ([name])    Allergies    Arthritis    Chest pain    Chronic lower back pain    Chronic pain of right wrist    Coronary artery disease    a. Multiple prior caths/PCI. Cath 2013 with possible spasm of RCA, 70% ISR of mid LCx with subsequent DES to mLCx and prox LCX. b. H/o microvascular angina. c. Recurrent angina 08/2014 - s/p PTCA/DES to prox Cx, PTCA/CBA to OM1.  c. LHC 06/10/15 with patent stents and some ISR in LCX and OM-1 that was not flow limiting --> Rx    Dyslipidemia    a. Intolerant to many statins except tolerating Livalo.   GERD (gastroesophageal reflux disease)    H/O cardiac catheterization 10/25/2018   Heart attack [facility])    Hypertension    Myocardial infarction [facility]) ~ 2010   S/P angioplasty with stent, DES, to proximal and mid LCX 12/15/11 12/15/2011   S/P foot surgery, right 04/2021   Shoulder pain    Stroke [facility])    pt. reports had a stroke around time of MI 2010   Type II diabetes mellitus ([name])  Unstable angina [facility])     Past Surgical History:  Procedure Laterality Date   CARDIAC CATHETERIZATION  06/15/2002   LAD with prox 40% stenosis, norma L main, Cfx with 25% lesion, RCA with long mid 25% stenosis (Dr. Vita Barley)   CARDIAC CATHETERIZATION  04/01/2010   normal L main, LAD wit mild stenosis, L Cfx with 70% in-stent restenosis, RCA with 70% in-stent restenosis, LVEF >60% (Dr. K. Mali Hilty) - cutting ballon arthrectomy to RCA & Cfx (Dr. Rockne Menghini)   CARDIAC CATHETERIZATION  08/25/2010   preserved global LV contractility; multivessel CAD, diffuse 90-95% in-stent restenosis in prox placed Cfx stent - cutting balloon arthrectomy in Cfx with multiple  dilatations 90-95% to 0% stenosis (Dr. Corky Downs)   CARDIAC CATHETERIZATION  01/26/2011   PCI & stenting of aggresive in-stent restenosis within previously stented AV groove Cfx with 3.0x79m Taxus DES (previous stents were Promus) (Dr. JAdora Fridge   CARDIAC CATHETERIZATION  05/11/2011   preserved LV function, 40% mid LAD stenosis, 30-40% narrowing proximal to stented semgnet of prox Cfx, patent mid RCA stent with smooth 20% narrowing in distal RCA (Dr. TCorky Downs   CARDIAC CATHETERIZATION  12/15/2011   PCI & stenting of proximal & mid Cfx with DES - 3.0x155min proximal, 3.0x154mn mid (Dr. J. Adora Fridge [facility] 06/10/2015   Procedure: Left Heart Cath and Coronary Angiography;  Surgeon: DanJolaine ArtistD;  Location: [facility] [place];  Service: Cardiovascular;  Laterality: N/A;   CARDIAC CATHETERIZATION  04/00/37/0488cardiometabolic testing  08/16/89/6945good exercise effort, peak VO2 79% predicted with normal VO2 HR curves (mild deconditioning)   COLONOSCOPY  12/2012   diminutive hyperplastic sigmoid poyp so repeat routine 2024   CORONARY BALLOON ANGIOPLASTY N/A 10/25/2018   Procedure: CORONARY BALLOON ANGIOPLASTY;  Surgeon: VarJettie BoozeD;  Location: [facility] [place] [facility];  Service: Cardiovascular;  Laterality: N/A;   CORONARY BALLOON ANGIOPLASTY N/A 09/29/2019   Procedure: CORONARY BALLOON ANGIOPLASTY;  Surgeon: EndNelva BushD;  Location: [facility] [place] [facility];  Service: Cardiovascular;  Laterality: N/A;   EXCISIONAL HEMORRHOIDECTOMY  1984   INTRAVASCULAR ULTRASOUND/IVUS N/A 09/29/2019   Procedure: Intravascular Ultrasound/IVUS;  Surgeon: EndNelva BushD;  Location: [facility] [place] [facility];  Service: Cardiovascular;  Laterality: N/A;   LEFT HEART CATH AND CORONARY ANGIOGRAPHY N/A 10/25/2018   Procedure: LEFT HEART CATH AND CORONARY ANGIOGRAPHY;  Surgeon: VarJettie BoozeD;  Location: [facility] [place] [facility];  Service: Cardiovascular;  Laterality: N/A;   LEFT HEART  CATH AND CORONARY ANGIOGRAPHY N/A 09/29/2019   Procedure: LEFT HEART CATH AND CORONARY ANGIOGRAPHY;  Surgeon: EndNelva BushD;  Location: [facility] [place] [facility];  Service: Cardiovascular;  Laterality: N/A;   LEFT HEART CATHETERIZATION WITH CORONARY ANGIOGRAM N/A 05/11/2011   Procedure: LEFT HEART CATHETERIZATION WITH CORONARY ANGIOGRAM;  Surgeon: ThoTroy SineD;  [facility];  Service: Cardiovascular;  Laterality: N/A;  Possible percutaneous coronary intervention, possible IVUS   LEFT HEART CATHETERIZATION WITH CORONARY ANGIOGRAM N/A 12/15/2011   Procedure: LEFT HEART CATHETERIZATION WITH CORONARY ANGIOGRAM;  Surgeon: JonLorretta HarpD;  [facility];  Service: Cardiovascular;  Laterality: N/A;   LEFT HEART CATHETERIZATION WITH CORONARY ANGIOGRAM N/A 09/05/2014   Procedure: LEFT HEART CATHETERIZATION WITH CORONARY ANGIOGRAM;  Surgeon: ChrBurnell BlanksD;  [facility];  Service: Cardiovascular;  Laterality: N/A;   LIPOMA EXCISION     back of the head   [facility]  PERF WALL MOTION  02/2012   lexiscan myoview; mild perfusion defect in mid inferolateral & basal inferolateral region (infarct/scar); EF 52%, abnormal but ow risk scan   PERCUTANEOUS CORONARY STENT INTERVENTION (PCI-S)  09/05/2014   Procedure: PERCUTANEOUS CORONARY STENT INTERVENTION (PCI-S);  Surgeon: Burnell Blanks, MD;  Location: [facility] CATH LAB;  Service: Cardiovascular;;   TONSILLECTOMY      Family History  Problem Relation Age of Onset   Leukemia Mother    Prostate cancer Father    Coronary artery disease Paternal Grandmother    Cancer Paternal Grandfather    Cancer Brother     Social History   Socioeconomic History   Marital status: Unknown    Spouse name: Not on file   Number of children: 2   Years of education: GED   Highest education level: Not on file  Occupational History   Not on file  Tobacco Use   Smoking status: Former    Packs/day: 1.00    Years: 10.00    Pack  years: 10.00    Types: Cigarettes    Quit date: 10/07/2018    Years since quitting: 2.3   Smokeless tobacco: Never  Vaping Use   Vaping Use: Never used  Substance and Sexual Activity   Alcohol use: Not Currently    Comment: 2 bottles of wine on 12/08/2019 (one occurrence)   Drug use: Not Currently    Types: Cocaine    Comment: last use several months ago   Sexual activity: Not Currently  Other Topics Concern   Not on file  Social History Narrative   ** Merged History Encounter **       Social Determinants of Health   Financial Resource Strain: Not on file  Food Insecurity: Not on file  Transportation Needs: Not on file  Physical Activity: Not on file  Stress: Not on file  Social Connections: Not on file  Intimate Partner Violence: Not on file    Outpatient Medications Prior to Visit  Medication Sig Dispense Refill   Accu-Chek Softclix Lancets lancets SMARTSIG:1 Topical 4 Times Daily     amLODipine (NORVASC) 10 MG tablet Take 1 tablet (10 mg total) by mouth daily. 90 tablet 3   aspirin EC 81 MG EC tablet Take 1 tablet (81 mg total) by mouth daily. (May buy from over the counter): Swallow whole for heart health 30 tablet 0   blood glucose meter kit and supplies KIT Dispense based on patient and insurance preference. Use up to four times daily as directed. (FOR ICD-9 250.00, 250.01). 1 each 0   clopidogrel (PLAVIX) 75 MG tablet Take 1 tablet (75 mg total) by mouth daily. 90 tablet 1   EPINEPHrine 0.3 mg/0.3 mL IJ SOAJ injection Inject 0.3 mg into the muscle as needed for anaphylaxis. 1 each 2   glucose blood (ACCU-CHEK GUIDE) test strip Use as instructed 100 each 12   LIVALO 4 MG TABS Take 1 tablet (4 mg total) by mouth daily. 90 tablet 3   metFORMIN (GLUCOPHAGE) 500 MG tablet TAKE 1 TABLET BY MOUTH TWICE DAILY WITH A MEAL 180 tablet 2   metoprolol tartrate (LOPRESSOR) 100 MG tablet Take 1 tablet (100 mg total) by mouth 2 (two) times daily. 180 tablet 3   nicotine (NICODERM CQ)  21 mg/24hr patch Place 1 patch (21 mg total) onto the skin daily. 28 patch 1   nitroGLYCERIN (NITROSTAT) 0.4 MG SL tablet Place 1 tablet (0.4 mg total) under the tongue every 5 (five) minutes  x 3 doses as needed for chest pain. 25 tablet 3   SF 5000 PLUS 1.1 % CREA dental cream See admin instructions.     Testosterone (ANDROGEL PUMP) 20.25 MG/ACT (1.62%) GEL Place 1 Pump onto the skin daily. 75 g 2   cetirizine (ZYRTEC ALLERGY) 10 MG tablet Take 1 tablet (10 mg total) by mouth daily. 30 tablet 0   fluticasone (FLONASE) 50 MCG/ACT nasal spray Place 1 spray into both nostrils 2 (two) times daily as needed. 16 g 5   No facility-administered medications prior to visit.    Allergies  Allergen Reactions   Bee Venom Anaphylaxis and Hives   Shellfish Allergy Anaphylaxis and Hives   Statins Other (See Comments)    Myalgias. Tolerating livalo. Pain    Statins    Testosterone Cypionate     Testerone Injection --Increased breast tissue     ROS Review of Systems    Objective:    Physical Exam HENT:     Head: Normocephalic and atraumatic.  Eyes:     General: No visual field deficit.    Extraocular Movements: Extraocular movements intact.     Right eye: Normal extraocular motion and no nystagmus.     Left eye: Normal extraocular motion and no nystagmus.     Pupils: Pupils are equal, round, and reactive to light.  Cardiovascular:     Rate and Rhythm: Normal rate and regular rhythm.     Heart sounds: Normal heart sounds.  Pulmonary:     Effort: Pulmonary effort is normal.     Breath sounds: Normal breath sounds.  Abdominal:     General: Bowel sounds are normal.     Palpations: Abdomen is soft.  Musculoskeletal:        General: Normal range of motion.     Cervical back: Normal range of motion and neck supple.  Skin:    General: Skin is warm and dry.     Capillary Refill: Capillary refill takes less than 2 seconds.  Neurological:     Mental Status: He is alert and oriented to  person, place, and time.     Cranial Nerves: No cranial nerve deficit, dysarthria or facial asymmetry.     Sensory: No sensory deficit.     Motor: No weakness.     Coordination: Coordination normal.     Gait: Gait normal.  Psychiatric:        Mood and Affect: Mood normal.        Speech: Speech normal.        Behavior: Behavior normal.   BP 129/86 (BP Location: Left Arm, Patient Position: Sitting)   Pulse (!) 58   Temp (!) 97.2 F (36.2 C)   Ht 5' 8"  (1.727 m)   Wt 189 lb (85.7 kg)   SpO2 99%   BMI 28.74 kg/m  Wt Readings from Last 3 Encounters:  02/12/21 189 lb (85.7 kg)  01/21/21 188 lb 0.4 oz (85.3 kg)  09/30/20 196 lb (88.9 kg)     Health Maintenance Due  Topic Date Due   COVID-19 Vaccine (1) Never done   Zoster Vaccines- Shingrix (1 of 2) Never done   Pneumococcal Vaccine 66-28 Years old (3 - PCV) 07/28/2018   FOOT EXAM  03/03/2019   OPHTHALMOLOGY EXAM  11/16/2019   INFLUENZA VACCINE  01/27/2021    There are no preventive care reminders to display for this patient.  Lab Results  Component Value Date   TSH 1.089 03/19/2020   Lab Results  Component Value Date   WBC 5.9 10/31/2020   HGB 14.5 10/31/2020   HCT 44.2 10/31/2020   MCV 85 10/31/2020   PLT 246 10/31/2020   Lab Results  Component Value Date   NA 139 10/31/2020   K 4.4 10/31/2020   CO2 22 03/24/2020   GLUCOSE 97 10/31/2020   BUN 11 10/31/2020   CREATININE 1.04 10/31/2020   BILITOT 0.5 10/31/2020   ALKPHOS 72 10/31/2020   AST 19 10/31/2020   ALT 31 03/17/2020   PROT 6.9 10/31/2020   ALBUMIN 4.8 10/31/2020   CALCIUM 9.9 10/31/2020   ANIONGAP 12 03/24/2020   EGFR 83 10/31/2020   Lab Results  Component Value Date   CHOL 161 10/31/2020   Lab Results  Component Value Date   HDL 44 10/31/2020   Lab Results  Component Value Date   LDLCALC 99 10/31/2020   Lab Results  Component Value Date   TRIG 96 10/31/2020   Lab Results  Component Value Date   CHOLHDL 3.7 10/31/2020   Lab  Results  Component Value Date   HGBA1C 6.2 (A) 02/12/2021      Assessment & Plan:   Problem List Items Addressed This Visit       Cardiovascular and Mediastinum   Essential hypertension (Chronic) Stable Encourage patient to continue the good work discontinuing alcohol and tobacco use and regular exercise Encouraged on going compliance with current medication regimen Encouraged home monitoring and recording BP <130/80 Eating a heart-healthy diet with less salt Encouraged regular physical activity      Endocrine   DM2 (diabetes mellitus, type 2) ([name]) - Primary (Chronic) Controlled current A1c 6.2% Continue with current regimen.  No changes warranted. Good patient compliance.    Relevant Orders   Urinalysis Dipstick (Completed)   HgB A1c (Completed)     Other   Hemoglobin A1c less than 7.0% At goal   Other Visit Diagnoses     Lightheadedness     Worsening Discussed treatment options  Patient would like to be evaluated by specialist   Relevant Orders   Ambulatory referral to Neurology   Bradycardia     Discussed encourage blood pressure monitoring with heart rate may consider decreasing metoprolol if needed for heart rate less than 58   Hyperlipidemia, unspecified hyperlipidemia type     Stable Continue with heart healthy diet and regular exercise       Meds ordered this encounter  Medications   Blood Pressure Monitor KIT    Sig: 1 kit by Does not apply route daily.    Dispense:  1 kit    Refill:  0    Order Specific Question:   Supervising Provider    Answer:   Tresa Garter [[id number]]    Follow-up: Return for Appointment As Scheduled.    Vevelyn Francois, NP

## 2021-02-13 ENCOUNTER — Encounter: Payer: Self-pay | Admitting: Nurse Practitioner

## 2021-02-27 ENCOUNTER — Ambulatory Visit: Payer: Medicaid Other | Admitting: Internal Medicine

## 2021-03-05 ENCOUNTER — Other Ambulatory Visit: Payer: Self-pay

## 2021-03-05 ENCOUNTER — Ambulatory Visit (INDEPENDENT_AMBULATORY_CARE_PROVIDER_SITE_OTHER): Payer: Medicaid Other | Admitting: Nurse Practitioner

## 2021-03-05 ENCOUNTER — Encounter: Payer: Self-pay | Admitting: Nurse Practitioner

## 2021-03-05 VITALS — BP 122/86 | HR 56 | Temp 97.2°F | Ht 68.0 in | Wt 188.2 lb

## 2021-03-05 DIAGNOSIS — R5383 Other fatigue: Secondary | ICD-10-CM

## 2021-03-05 DIAGNOSIS — Z23 Encounter for immunization: Secondary | ICD-10-CM

## 2021-03-05 DIAGNOSIS — I10 Essential (primary) hypertension: Secondary | ICD-10-CM

## 2021-03-05 DIAGNOSIS — E1159 Type 2 diabetes mellitus with other circulatory complications: Secondary | ICD-10-CM | POA: Diagnosis not present

## 2021-03-05 DIAGNOSIS — R001 Bradycardia, unspecified: Secondary | ICD-10-CM

## 2021-03-05 DIAGNOSIS — F458 Other somatoform disorders: Secondary | ICD-10-CM

## 2021-03-05 DIAGNOSIS — E785 Hyperlipidemia, unspecified: Secondary | ICD-10-CM | POA: Diagnosis not present

## 2021-03-05 DIAGNOSIS — G44209 Tension-type headache, unspecified, not intractable: Secondary | ICD-10-CM

## 2021-03-05 LAB — POCT URINALYSIS DIP (CLINITEK)
Bilirubin, UA: NEGATIVE
Blood, UA: NEGATIVE
Glucose, UA: NEGATIVE mg/dL
Ketones, POC UA: NEGATIVE mg/dL
Leukocytes, UA: NEGATIVE
Nitrite, UA: NEGATIVE
POC PROTEIN,UA: NEGATIVE
Spec Grav, UA: 1.02 (ref 1.010–1.025)
Urobilinogen, UA: 0.2 E.U./dL
pH, UA: 6 (ref 5.0–8.0)

## 2021-03-05 LAB — GLUCOSE, POCT (MANUAL RESULT ENTRY): POC Glucose: 122 mg/dl — AB (ref 70–99)

## 2021-03-05 LAB — POCT GLYCOSYLATED HEMOGLOBIN (HGB A1C)
HbA1c POC (<> result, manual entry): 5 % (ref 4.0–5.6)
HbA1c, POC (controlled diabetic range): 5 % (ref 0.0–7.0)
HbA1c, POC (prediabetic range): 5 % — AB (ref 5.7–6.4)
Hemoglobin A1C: 5 % (ref 4.0–5.6)

## 2021-03-05 MED ORDER — METOPROLOL TARTRATE 100 MG PO TABS: 50.0000 mg | ORAL_TABLET | Freq: Two times a day (BID) | ORAL | 3 refills | Status: DC

## 2021-03-05 NOTE — Progress Notes (Signed)
[place], [place]  [address] Phone:  [phone]   Fax:  [phone]   Established Patient Office Visit  Subjective:  Patient ID: [name], male    DOB: 04/09/1961  Age: 60 y.o. MRN: [id number]  CC:  Chief Complaint  Patient presents with   Annual Exam    Pt is here today for his annual physical and labs. Pt states that he is still very fatigue with daily headaches.    HPI [name] presents for Annual exam. He  has a past medical history of Alcohol dependence ([place]), Allergies, Arthritis, Chest pain, Chronic lower back pain, Chronic pain of right wrist, Coronary artery disease, Dyslipidemia, GERD (gastroesophageal reflux disease), H/O cardiac catheterization (10/25/2018), Heart attack [facility]), Hypertension, Myocardial infarction ([place]) (~ 2010), S/P angioplasty with stent, DES, to proximal and mid LCX 12/15/11 (12/15/2011), S/P foot surgery, right (04/2021), Shoulder pain, Stroke ([place]), Type II diabetes mellitus ([place]), and Unstable angina ([place]).   He reports that he continues to have tension headaches. He grinds is teeth. He has used a night mouth guard for >1 year however continues to grind teeth. He was seen a few weeks ago for the ongoing headaches that are worse in the morning . The headaches resolve after about 1 hour.  Then he has the dizziness.  He is currently having increased fatigue.  He is on metoprolol 100 mg twice daily and his heart rate mid to upper 50s to low 60s.  He has monitored his blood pressure on a few occasions since his last visit.  His blood pressure ranges from 110's to 120s over 80s with a heart rate 56-68.  He has an appointment to establish care with neurology for further evaluation of the tension headaches however this appointment is not until October. He has made some significant lifestyle changes he no longer uses alcohol or tobacco.  He is exercising daily and has made dietary changes.  His weight is  maintained in the 180s.  His goal weight is about 7 pounds less.  He is striving to reach his goal weight however with exercise heis building muscle.  Past Medical History:  Diagnosis Date   Alcohol dependence ([name])    Allergies    Arthritis    Chest pain    Chronic lower back pain    Chronic pain of right wrist    Coronary artery disease    a. Multiple prior caths/PCI. Cath 2013 with possible spasm of RCA, 70% ISR of mid LCx with subsequent DES to mLCx and prox LCX. b. H/o microvascular angina. c. Recurrent angina 08/2014 - s/p PTCA/DES to prox Cx, PTCA/CBA to OM1.  c. LHC 06/10/15 with patent stents and some ISR in LCX and OM-1 that was not flow limiting --> Rx    Dyslipidemia    a. Intolerant to many statins except tolerating Livalo.   GERD (gastroesophageal reflux disease)    H/O cardiac catheterization 10/25/2018   Heart attack [facility])    Hypertension    Myocardial infarction [facility]) ~ 2010   S/P angioplasty with stent, DES, to proximal and mid LCX 12/15/11 12/15/2011   S/P foot surgery, right 04/2021   Shoulder pain    Stroke [facility])    pt. reports had a stroke around time of MI 2010   Type II diabetes mellitus ([place])    Unstable angina [facility])     Past Surgical History:  Procedure Laterality Date   CARDIAC CATHETERIZATION  06/15/2002   LAD with prox 40% stenosis, norma L main, Cfx with 25% lesion, RCA with long mid 25% stenosis (Dr. Vita Barley)   CARDIAC CATHETERIZATION  04/01/2010   normal L main, LAD wit mild stenosis, L Cfx with 70% in-stent restenosis, RCA with 70% in-stent restenosis, LVEF >60% (Dr. K. Mali Hilty) - cutting ballon arthrectomy to RCA & Cfx (Dr. Rockne Menghini)   CARDIAC CATHETERIZATION  08/25/2010   preserved global LV contractility; multivessel CAD, diffuse 90-95% in-stent restenosis in prox placed Cfx stent - cutting balloon arthrectomy in Cfx with multiple dilatations 90-95% to 0% stenosis (Dr. Corky Downs)   CARDIAC CATHETERIZATION  01/26/2011   PCI & stenting of  aggresive in-stent restenosis within previously stented AV groove Cfx with 3.0x74m Taxus DES (previous stents were Promus) (Dr. JAdora Fridge   CARDIAC CATHETERIZATION  05/11/2011   preserved LV function, 40% mid LAD stenosis, 30-40% narrowing proximal to stented semgnet of prox Cfx, patent mid RCA stent with smooth 20% narrowing in distal RCA (Dr. TCorky Downs   CARDIAC CATHETERIZATION  12/15/2011   PCI & stenting of proximal & mid Cfx with DES - 3.0x18min proximal, 3.0x1569mn mid (Dr. J. [place]ora [place]idge [place] 06/10/2015   Procedure: Left Heart Cath and Coronary Angiography;  Surgeon: DanJolaine ArtistD;  Location: [facility] [place] [facility];  Service: Cardiovascular;  Laterality: N/A;   CARDIAC CATHETERIZATION  10/22/83/2778cardiometabolic testing  08/10/40/3536good exercise effort, peak VO2 79% predicted with normal VO2 HR curves (mild deconditioning)   COLONOSCOPY  12/2012   diminutive hyperplastic sigmoid poyp so repeat routine 2024   CORONARY BALLOON ANGIOPLASTY N/A 10/25/2018   Procedure: CORONARY BALLOON ANGIOPLASTY;  Surgeon: VarJettie BoozeD;  Location: [facility] [place] [facility];  Service: Cardiovascular;  Laterality: N/A;   CORONARY BALLOON ANGIOPLASTY N/A 09/29/2019   Procedure: CORONARY BALLOON ANGIOPLASTY;  Surgeon: EndNelva BushD;  Location: [facility] [place] [facility];  Service: Cardiovascular;  Laterality: N/A;   EXCISIONAL HEMORRHOIDECTOMY  1984   INTRAVASCULAR ULTRASOUND/IVUS N/A 09/29/2019   Procedure: Intravascular Ultrasound/IVUS;  Surgeon: EndNelva BushD;  Location: [facility] [place] [facility];  Service: Cardiovascular;  Laterality: N/A;   LEFT HEART CATH AND CORONARY ANGIOGRAPHY N/A 10/25/2018   Procedure: LEFT HEART CATH AND CORONARY ANGIOGRAPHY;  Surgeon: VarJettie BoozeD;  Location: [facility] [place] [facility];  Service: Cardiovascular;  Laterality: N/A;   LEFT HEART CATH AND CORONARY ANGIOGRAPHY N/A 09/29/2019   Procedure: LEFT HEART CATH AND CORONARY ANGIOGRAPHY;  Surgeon:  EndNelva BushD;  Location: [facility] [place] [facility];  Service: Cardiovascular;  Laterality: N/A;   LEFT HEART CATHETERIZATION WITH CORONARY ANGIOGRAM N/A 05/11/2011   Procedure: LEFT HEART CATHETERIZATION WITH CORONARY ANGIOGRAM;  Surgeon: ThoTroy SineD;  [facility];  Service: Cardiovascular;  Laterality: N/A;  Possible percutaneous coronary intervention, possible IVUS   LEFT HEART CATHETERIZATION WITH CORONARY ANGIOGRAM N/A 12/15/2011   Procedure: LEFT HEART CATHETERIZATION WITH CORONARY ANGIOGRAM;  Surgeon: JonLorretta HarpD;  [facility];  Service: Cardiovascular;  Laterality: N/A;   LEFT HEART CATHETERIZATION WITH CORONARY ANGIOGRAM N/A 09/05/2014   Procedure: LEFT HEART CATHETERIZATION WITH CORONARY ANGIOGRAM;  Surgeon: ChrBurnell BlanksD;  [facility];  Service: Cardiovascular;  Laterality: N/A;   LIPOMA EXCISION     back of the head   NM MYOCAR PERF WALL MOTION  02/2012   lexiscan myoview; mild perfusion defect in mid inferolateral & basal inferolateral region (  infarct/scar); EF 52%, abnormal but ow risk scan   PERCUTANEOUS CORONARY STENT INTERVENTION (PCI-S)  09/05/2014   Procedure: PERCUTANEOUS CORONARY STENT INTERVENTION (PCI-S);  Surgeon: Burnell Blanks, MD;  Location: [facility] CATH LAB;  Service: Cardiovascular;;   TONSILLECTOMY      Family History  Problem Relation Age of Onset   Leukemia Mother    Prostate cancer Father    Coronary artery disease Paternal Grandmother    Cancer Paternal Grandfather    Cancer Brother     Social History   Socioeconomic History   Marital status: Unknown    Spouse name: Not on file   Number of children: 2   Years of education: GED   Highest education level: Not on file  Occupational History   Not on file  Tobacco Use   Smoking status: Former    Packs/day: 1.00    Years: 10.00    Pack years: 10.00    Types: Cigarettes    Quit date: 10/07/2018    Years since quitting: 2.4   Smokeless tobacco:  Never  Vaping Use   Vaping Use: Never used  Substance and Sexual Activity   Alcohol use: Not Currently    Comment: 2 bottles of wine on 12/08/2019 (one occurrence)   Drug use: Not Currently    Types: Cocaine    Comment: last use several months ago   Sexual activity: Not Currently  Other Topics Concern   Not on file  Social History Narrative   ** Merged History Encounter **       Social Determinants of Health   Financial Resource Strain: Not on file  Food Insecurity: Not on file  Transportation Needs: Not on file  Physical Activity: Not on file  Stress: Not on file  Social Connections: Not on file  Intimate Partner Violence: Not on file    Outpatient Medications Prior to Visit  Medication Sig Dispense Refill   Accu-Chek Softclix Lancets lancets SMARTSIG:1 Topical 4 Times Daily     amLODipine (NORVASC) 10 MG tablet Take 1 tablet (10 mg total) by mouth daily. 90 tablet 3   aspirin EC 81 MG EC tablet Take 1 tablet (81 mg total) by mouth daily. (May buy from over the counter): Swallow whole for heart health 30 tablet 0   blood glucose meter kit and supplies KIT Dispense based on patient and insurance preference. Use up to four times daily as directed. (FOR ICD-9 250.00, 250.01). 1 each 0   Blood Pressure Monitor KIT 1 kit by Does not apply route daily. 1 kit 0   clopidogrel (PLAVIX) 75 MG tablet Take 1 tablet (75 mg total) by mouth daily. 90 tablet 1   EPINEPHrine 0.3 mg/0.3 mL IJ SOAJ injection Inject 0.3 mg into the muscle as needed for anaphylaxis. 1 each 2   glucose blood (ACCU-CHEK GUIDE) test strip Use as instructed 100 each 12   LIVALO 4 MG TABS Take 1 tablet (4 mg total) by mouth daily. 90 tablet 3   metFORMIN (GLUCOPHAGE) 500 MG tablet TAKE 1 TABLET BY MOUTH TWICE DAILY WITH A MEAL 180 tablet 2   nitroGLYCERIN (NITROSTAT) 0.4 MG SL tablet Place 1 tablet (0.4 mg total) under the tongue every 5 (five) minutes x 3 doses as needed for chest pain. 25 tablet 3   SF 5000 PLUS 1.1  % CREA dental cream See admin instructions.     Testosterone (ANDROGEL PUMP) 20.25 MG/ACT (1.62%) GEL Place 1 Pump onto the skin daily. 75 g 2  metoprolol tartrate (LOPRESSOR) 100 MG tablet Take 1 tablet (100 mg total) by mouth 2 (two) times daily. 180 tablet 3   nicotine (NICODERM CQ) 21 mg/24hr patch Place 1 patch (21 mg total) onto the skin daily. (Patient not taking: Reported on 03/05/2021) 28 patch 1   No facility-administered medications prior to visit.    Allergies  Allergen Reactions   Bee Venom Anaphylaxis and Hives   Shellfish Allergy Anaphylaxis and Hives   Statins Other (See Comments)    Myalgias. Tolerating livalo. Pain    Statins    Testosterone Cypionate     Testerone Injection --Increased breast tissue     ROS Review of Systems  All other systems reviewed and are negative.    Objective:    Physical Exam Constitutional:      General: He is not in acute distress.    Appearance: He is normal weight. He is not ill-appearing, toxic-appearing or diaphoretic.  HENT:     Head: Normocephalic and atraumatic.     Right Ear: Tympanic membrane normal.     Left Ear: Tympanic membrane normal.     Nose: Nose normal.     Mouth/Throat:     Mouth: Mucous membranes are moist.  Eyes:     Pupils: Pupils are equal, round, and reactive to light.  Cardiovascular:     Rate and Rhythm: Normal rate and regular rhythm.     Pulses: Normal pulses.     Heart sounds: Normal heart sounds.  Pulmonary:     Effort: Pulmonary effort is normal.     Breath sounds: Normal breath sounds.  Abdominal:     General: Bowel sounds are normal.     Palpations: Abdomen is soft.  Genitourinary:    Prostate: Normal.     Rectum: Guaiac result negative.  Musculoskeletal:        General: Normal range of motion.     Cervical back: Normal range of motion.  Feet:     Comments: Well healed surgical scar to right foot.  Skin:    General: Skin is warm and dry.     Capillary Refill: Capillary refill  takes less than 2 seconds.  Neurological:     General: No focal deficit present.     Mental Status: He is alert and oriented to person, place, and time.  Psychiatric:        Mood and Affect: Mood normal.        Behavior: Behavior normal.        Thought Content: Thought content normal.        Judgment: Judgment normal.   BP 122/86   Pulse (!) 56   Temp (!) 97.2 F (36.2 C)   Ht 5' 8"  (1.727 m)   Wt 188 lb 3.2 oz (85.4 kg)   SpO2 100%   BMI 28.62 kg/m  Wt Readings from Last 3 Encounters:  03/05/21 188 lb 3.2 oz (85.4 kg)  02/12/21 189 lb (85.7 kg)  01/21/21 188 lb 0.4 oz (85.3 kg)     Health Maintenance Due  Topic Date Due   Zoster Vaccines- Shingrix (2 of 2) 03/08/2013   FOOT EXAM  03/03/2019    There are no preventive care reminders to display for this patient.  Lab Results  Component Value Date   TSH 1.089 03/19/2020   Lab Results  Component Value Date   WBC 5.9 10/31/2020   HGB 14.5 10/31/2020   HCT 44.2 10/31/2020   MCV 85 10/31/2020   PLT 246  10/31/2020   Lab Results  Component Value Date   NA 139 10/31/2020   K 4.4 10/31/2020   CO2 22 03/24/2020   GLUCOSE 97 10/31/2020   BUN 11 10/31/2020   CREATININE 1.04 10/31/2020   BILITOT 0.5 10/31/2020   ALKPHOS 72 10/31/2020   AST 19 10/31/2020   ALT 31 03/17/2020   PROT 6.9 10/31/2020   ALBUMIN 4.8 10/31/2020   CALCIUM 9.9 10/31/2020   ANIONGAP 12 03/24/2020   EGFR 83 10/31/2020   Lab Results  Component Value Date   CHOL 161 10/31/2020   Lab Results  Component Value Date   HDL 44 10/31/2020   Lab Results  Component Value Date   LDLCALC 99 10/31/2020   Lab Results  Component Value Date   TRIG 96 10/31/2020   Lab Results  Component Value Date   CHOLHDL 3.7 10/31/2020   Lab Results  Component Value Date   HGBA1C 5.0 03/05/2021   HGBA1C 5.0 03/05/2021   HGBA1C 5.0 (A) 03/05/2021   HGBA1C 5.0 03/05/2021      Assessment & Plan:   Problem List Items Addressed This Visit        Cardiovascular and Mediastinum   Essential hypertension (Chronic) Stable Encouraged on going compliance with current medication regimen Encouraged home monitoring and recording BP <130/80 Continue eating a heart-healthy diet with less salt Encouraged regular physical activity     Relevant Medications   metoprolol tartrate (LOPRESSOR) 100 MG tablet     Endocrine   DM2 (diabetes mellitus, type 2) ([name]) - Primary (Chronic) Controlled Encourage compliance with current treatment regimen  Encourage regular CBG monitoring Encourage contacting office if excessive hyperglycemia and or hypoglycemia Continue lifestyle modification with healthy diet (fewer calories, more high fiber foods, whole grains and non-starchy vegetables, lower fat meat and fish, low-fat diary include healthy oils) regular exercise (physical activity) and weight loss Opthalmology exam completed Regular dental visits encouraged Home BP monitoring also encouraged     Relevant Orders   POCT URINALYSIS DIP (CLINITEK) (Completed)   HgB A1c (Completed)   Glucose (CBG) (Completed)   Comp. Metabolic Panel (12)   Other Visit Diagnoses     Need for immunization against influenza       Relevant Orders   Flu Vaccine QUAD 6+ mos IM (Fluarix) (Completed)   Hyperlipidemia, unspecified hyperlipidemia type    Stable Continue with heart healthy diet and daily exercise   Relevant Medications   metoprolol tartrate (LOPRESSOR) 100 MG tablet   Other Relevant Orders   Lipid panel   Tension headache    Persistent No current treatment recommendations due to patient going into diagnosis   Relevant Medications   metoprolol tartrate (LOPRESSOR) 100 MG tablet   Bruxism (teeth grinding)  Persistent Possible cause of tension headaches      Fatigue, unspecified type   Worsening Encouraged decreasing metoprolol 50 mg twice daily 6-week follow-up Encourage continued monitoring of blood pressure and heart rate Encouraged bringing monitor  to the office for evaluation and possible guidance manual blood pressure monitoring       Meds ordered this encounter  Medications   metoprolol tartrate (LOPRESSOR) 100 MG tablet    Sig: Take 0.5 tablets (50 mg total) by mouth 2 (two) times daily.    Dispense:  180 tablet    Refill:  3    Order Specific Question:   Supervising Provider    Answer:   Tresa Garter [id number]    Follow-up: Return in about 6 weeks (around  04/16/2021) for Follow up HTN [id number].    Vevelyn Francois, NP

## 2021-03-05 NOTE — Patient Instructions (Signed)
Health Maintenance, Male Adopting a healthy lifestyle and getting preventive care are important in promoting health and wellness. Ask your health care provider about: The right schedule for you to have regular tests and exams. Things you can do on your own to prevent diseases and keep yourself healthy. What should I know about diet, weight, and exercise? Eat a healthy diet  Eat a diet that includes plenty of vegetables, fruits, low-fat dairy products, and lean protein. Do not eat a lot of foods that are high in solid fats, added sugars, or sodium. Maintain a healthy weight Body mass index (BMI) is a measurement that can be used to identify possible weight problems. It estimates body fat based on height and weight. Your health care provider can help determine your BMI and help you achieve or maintain a healthy weight. Get regular exercise Get regular exercise. This is one of the most important things you can do for your health. Most adults should: Exercise for at least 150 minutes each week. The exercise should increase your heart rate and make you sweat (moderate-intensity exercise). Do strengthening exercises at least twice a week. This is in addition to the moderate-intensity exercise. Spend less time sitting. Even light physical activity can be beneficial. Watch cholesterol and blood lipids Have your blood tested for lipids and cholesterol at 59 years of age, then have this test every 5 years. You may need to have your cholesterol levels checked more often if: Your lipid or cholesterol levels are high. You are older than 59 years of age. You are at high risk for heart disease. What should I know about cancer screening? Many types of cancers can be detected early and may often be prevented. Depending on your health history and family history, you may need to have cancer screening at various ages. This may include screening for: Colorectal cancer. Prostate cancer. Skin cancer. Lung  cancer. What should I know about heart disease, diabetes, and high blood pressure? Blood pressure and heart disease High blood pressure causes heart disease and increases the risk of stroke. This is more likely to develop in people who have high blood pressure readings, are of African descent, or are overweight. Talk with your health care provider about your target blood pressure readings. Have your blood pressure checked: Every 3-5 years if you are 18-39 years of age. Every year if you are 40 years old or older. If you are between the ages of 65 and 75 and are a current or former smoker, ask your health care provider if you should have a one-time screening for abdominal aortic aneurysm (AAA). Diabetes Have regular diabetes screenings. This checks your fasting blood sugar level. Have the screening done: Once every three years after age 45 if you are at a normal weight and have a low risk for diabetes. More often and at a younger age if you are overweight or have a high risk for diabetes. What should I know about preventing infection? Hepatitis B If you have a higher risk for hepatitis B, you should be screened for this virus. Talk with your health care provider to find out if you are at risk for hepatitis B infection. Hepatitis C Blood testing is recommended for: Everyone born from 1945 through 1965. Anyone with known risk factors for hepatitis C. Sexually transmitted infections (STIs) You should be screened each year for STIs, including gonorrhea and chlamydia, if: You are sexually active and are younger than 59 years of age. You are older than 59 years   of age and your health care provider tells you that you are at risk for this type of infection. Your sexual activity has changed since you were last screened, and you are at increased risk for chlamydia or gonorrhea. Ask your health care provider if you are at risk. Ask your health care provider about whether you are at high risk for HIV.  Your health care provider may recommend a prescription medicine to help prevent HIV infection. If you choose to take medicine to prevent HIV, you should first get tested for HIV. You should then be tested every 3 months for as long as you are taking the medicine. Follow these instructions at home: Lifestyle Do not use any products that contain nicotine or tobacco, such as cigarettes, e-cigarettes, and chewing tobacco. If you need help quitting, ask your health care provider. Do not use street drugs. Do not share needles. Ask your health care provider for help if you need support or information about quitting drugs. Alcohol use Do not drink alcohol if your health care provider tells you not to drink. If you drink alcohol: Limit how much you have to 0-2 drinks a day. Be aware of how much alcohol is in your drink. In the [place] one drink equals one 12 oz bottle of beer (355 mL), one 5 oz glass of wine (148 mL), or one 1 oz glass of hard liquor (44 mL). General instructions Schedule regular health, dental, and eye exams. Stay current with your vaccines. Tell your health care provider if: You often feel depressed. You have ever been abused or do not feel safe at home. Summary Adopting a healthy lifestyle and getting preventive care are important in promoting health and wellness. Follow your health care provider's instructions about healthy diet, exercising, and getting tested or screened for diseases. Follow your health care provider's instructions on monitoring your cholesterol and blood pressure. This information is not intended to replace advice given to you by your health care provider. Make sure you discuss any questions you have with your health care provider. Document Revised: 08/23/2020 Document Reviewed: 06/08/2018 Elsevier Patient Education  2022 [employer]. Metoprolol Tablets What is this medication? METOPROLOL (me TOE proe lole) treats high blood pressure. It also prevents chest  pain (angina) or further damage after a heart attack. It works by lowering your blood pressure and heart rate, making it easier for your heart to pump blood to the rest of your body. It belongs to a group of medications called beta blockers. This medicine may be used for other purposes; ask your health care provider or pharmacist if you have questions. COMMON BRAND NAME(S): Lopressor What should I tell my care team before I take this medication? They need to know if you have any of these conditions: Diabetes Heart or vessel disease like slow heart rate, worsening heart failure, heart block, sick sinus syndrome, or Raynaud's disease Kidney disease Liver disease Lung or breathing disease, like asthma or emphysema Pheochromocytoma Thyroid disease An unusual or allergic reaction to metoprolol, other beta blockers, medications, foods, dyes, or preservatives Pregnant or trying to get pregnant Breast-feeding How should I use this medication? Take this medication by mouth with water. Take it as directed on the prescription label at the same time every day. You can take it with or without food. You should always take it the same way. Keep taking it unless your care team tells you to stop. Talk to your care team about the use of this medication in children. Special  care may be needed. Overdosage: If you think you have taken too much of this medicine contact a poison control center or emergency room at once. NOTE: This medicine is only for you. Do not share this medicine with others. What if I miss a dose? If you miss a dose, take it as soon as you can. If it is almost time for your next dose, take only that dose. Do not take double or extra doses. What may interact with this medication? This medication may interact with the following: Certain medications for blood pressure, heart disease, irregular heartbeat Certain medications for depression like monoamine oxidase (MAO) inhibitors, fluoxetine, or  paroxetine Clonidine Dobutamine Epinephrine Isoproterenol Reserpine This list may not describe all possible interactions. Give your health care provider a list of all the medicines, herbs, non-prescription drugs, or dietary supplements you use. Also tell them if you smoke, drink alcohol, or use illegal drugs. Some items may interact with your medicine. What should I watch for while using this medication? Visit your care team for regular checks on your progress. Check your blood pressure as directed. Ask your care team what your blood pressure should be. Also, find out when you should contact them. Do not treat yourself for coughs, colds, or pain while you are using this medication without asking your care team for advice. Some medications may increase your blood pressure. You may get drowsy or dizzy. Do not drive, use machinery, or do anything that needs mental alertness until you know how this medication affects you. Do not stand up or sit up quickly, especially if you are an older patient. This reduces the risk of dizzy or fainting spells. Alcohol may interfere with the effect of this medication. Avoid alcoholic drinks. This medication may increase blood sugar. Ask your care team if changes in diet or medications are needed if you have diabetes. What side effects may I notice from receiving this medication? Side effects that you should report to your care team as soon as possible: Allergic reactions-skin rash, itching, hives, swelling of the face, lips, tongue, or throat Heart failure-shortness of breath, swelling of the ankles, feet, or hands, sudden weight gain, unusual weakness or fatigue Low blood pressure-dizziness, feeling faint or lightheaded, blurry vision Raynaud's-cool, numb, or painful fingers or toes that may change color from pale, to blue, to red Slow heartbeat-dizziness, feeling faint or lightheaded, confusion, trouble breathing, unusual weakness or fatigue Worsening mood,  feelings of depression Side effects that usually do not require medical attention (report to your care team if they continue or are bothersome): Change in sex drive or performance Diarrhea Dizziness Fatigue Headache This list may not describe all possible side effects. Call your doctor for medical advice about side effects. You may report side effects to FDA at 1-800-FDA-1088. Where should I keep my medication? Keep out of the reach of children and pets. Store at room temperature between 15 and 30 degrees C (59 and 86 degrees F). Protect from moisture. Keep the container tightly closed. Throw away any unused medication after the expiration date. NOTE: This sheet is a summary. It may not cover all possible information. If you have questions about this medicine, talk to your doctor, pharmacist, or health care provider.  2022 Elsevier/Gold Standard (2020-07-18 13:08:14)

## 2021-03-05 NOTE — Progress Notes (Deleted)
[place], [place]  [address] Phone:  [phone]   Fax:  [phone]   Established Patient Office Visit  Subjective:  Patient ID: [name], male    DOB: 07-Sep-1960  Age: 60 y.o. MRN: [id number]  CC:  Chief Complaint  Patient presents with   Annual Exam    Pt is here today for his annual physical and labs. Pt states that he is still very fatigue with daily headaches.    HPI [name] presents for ***  Past Medical History:  Diagnosis Date   Alcohol dependence ([place])    Allergies    Arthritis    Chest pain    Chronic lower back pain    Chronic pain of right wrist    Coronary artery disease    a. Multiple prior caths/PCI. Cath 2013 with possible spasm of RCA, 70% ISR of mid LCx with subsequent DES to mLCx and prox LCX. b. H/o microvascular angina. c. Recurrent angina 08/2014 - s/p PTCA/DES to prox Cx, PTCA/CBA to OM1.  c. LHC 06/10/15 with patent stents and some ISR in LCX and OM-1 that was not flow limiting --> Rx    Dyslipidemia    a. Intolerant to many statins except tolerating Livalo.   GERD (gastroesophageal reflux disease)    H/O cardiac catheterization 10/25/2018   Heart attack [facility])    Hypertension    Myocardial infarction [facility]) ~ 2010   S/P angioplasty with stent, DES, to proximal and mid LCX 12/15/11 12/15/2011   S/P foot surgery, right 04/2021   Shoulder pain    Stroke ([place])    pt. reports had a stroke around time of MI 2010   Type II diabetes mellitus ([place])    Unstable angina [facility])     Past Surgical History:  Procedure Laterality Date   CARDIAC CATHETERIZATION  06/15/2002   LAD with prox 40% stenosis, norma L main, Cfx with 25% lesion, RCA with long mid 25% stenosis (Dr. Vita [name])   CARDIAC CATHETERIZATION  04/01/2010   normal L main, LAD wit mild stenosis, L Cfx with 70% in-stent restenosis, RCA with 70% in-stent restenosis, LVEF >60% (Dr. K. Mali Hilty) - cutting ballon arthrectomy to RCA & Cfx (Dr.  Rockne Menghini)   CARDIAC CATHETERIZATION  08/25/2010   preserved global LV contractility; multivessel CAD, diffuse 90-95% in-stent restenosis in prox placed Cfx stent - cutting balloon arthrectomy in Cfx with multiple dilatations 90-95% to 0% stenosis (Dr. Corky Downs)   CARDIAC CATHETERIZATION  01/26/2011   PCI & stenting of aggresive in-stent restenosis within previously stented AV groove Cfx with 3.0x9m Taxus DES (previous stents were Promus) (Dr. JAdora Fridge   CARDIAC CATHETERIZATION  05/11/2011   preserved LV function, 40% mid LAD stenosis, 30-40% narrowing proximal to stented semgnet of prox Cfx, patent mid RCA stent with smooth 20% narrowing in distal RCA (Dr. TCorky Downs   CARDIAC CATHETERIZATION  12/15/2011   PCI & stenting of proximal & mid Cfx with DES - 3.0x177min proximal, 3.0x1570mn mid (Dr. J. Adora [place]idge [place] 06/10/2015   Procedure: Left Heart Cath and Coronary Angiography;  Surgeon: DanJolaine ArtistD;  Location: [facility];  Service: Cardiovascular;  Laterality: N/A;   CARDIAC CATHETERIZATION  04/73/71/0626cardiometabolic testing  08/17/46/5462good exercise effort, peak VO2 79% predicted with normal VO2 HR curves (mild deconditioning)   COLONOSCOPY  12/2012   diminutive hyperplastic sigmoid  poyp so repeat routine 2024   CORONARY BALLOON ANGIOPLASTY N/A 10/25/2018   Procedure: CORONARY BALLOON ANGIOPLASTY;  Surgeon: Jettie Booze, MD;  Location: [place] [facility];  Service: Cardiovascular;  Laterality: N/A;   CORONARY BALLOON ANGIOPLASTY N/A 09/29/2019   Procedure: CORONARY BALLOON ANGIOPLASTY;  Surgeon: Nelva Bush, MD;  Location: [place] [facility];  Service: Cardiovascular;  Laterality: N/A;   EXCISIONAL HEMORRHOIDECTOMY  1984   INTRAVASCULAR ULTRASOUND/IVUS N/A 09/29/2019   Procedure: Intravascular Ultrasound/IVUS;  Surgeon: Nelva Bush, MD;  Location: [place] [facility];  Service: Cardiovascular;  Laterality: N/A;   LEFT HEART CATH AND  CORONARY ANGIOGRAPHY N/A 10/25/2018   Procedure: LEFT HEART CATH AND CORONARY ANGIOGRAPHY;  Surgeon: Jettie Booze, MD;  Location: [place] [facility];  Service: Cardiovascular;  Laterality: N/A;   LEFT HEART CATH AND CORONARY ANGIOGRAPHY N/A 09/29/2019   Procedure: LEFT HEART CATH AND CORONARY ANGIOGRAPHY;  Surgeon: Nelva Bush, MD;  Location: [place] [facility];  Service: Cardiovascular;  Laterality: N/A;   LEFT HEART CATHETERIZATION WITH CORONARY ANGIOGRAM N/A 05/11/2011   Procedure: LEFT HEART CATHETERIZATION WITH CORONARY ANGIOGRAM;  Surgeon: Troy Sine, MD;  Location[facility] C[facility] LAB;  Service: Cardiovascular;  Laterality: N/A;  Possible percutaneous coronary intervention, possible IVUS   LEFT HEART CATHETERIZATION WITH CORONARY ANGIOGRAM N/A 12/15/2011   Procedure: LEFT HEART CATHETERIZATION WITH CORONARY ANGIOGRAM;  Surgeon: Lorretta Harp, MD;  Location: [facility] CATH LAB;  Service: Cardiovascular;  Laterality: N/A;   LEFT HEART CATHETERIZATION WITH CORONARY ANGIOGRAM N/A 09/05/2014   Procedure: LEFT HEART CATHETERIZATION WITH CORONARY ANGIOGRAM;  Surgeon: Burnell Blanks, MD;  Location: [facility] LAB;  Service: Cardiovascular;  Laterality: N/A;   LIPOMA EXCISION     back of the head   NM MYOCAR PERF WALL MOTION  02/2012   lexiscan myoview; mild perfusion defect in mid inferolateral & basal inferolateral region (infarct/scar); EF 52%, abnormal but ow risk scan   PERCUTANEOUS CORONARY STENT INTERVENTION (PCI-S)  09/05/2014   Procedure: PERCUTANEOUS CORONARY STENT INTERVENTION (PCI-S);  Surgeon: Burnell Blanks, MD;  Location: [facility] CATH LAB;  Service: Cardiovascular;;   TONSILLECTOMY      Family History  Problem Relation Age of Onset   Leukemia Mother    Prostate cancer Father    Coronary artery disease Paternal Grandmother    Cancer Paternal Grandfather    Cancer Brother     Social History   Socioeconomic History   Marital status: Unknown    Spouse name: Not on file    Number of children: 2   Years of education: GED   Highest education level: Not on file  Occupational History   Not on file  Tobacco Use   Smoking status: Former    Packs/day: 1.00    Years: 10.00    Pack years: 10.00    Types: Cigarettes    Quit date: 10/07/2018    Years since quitting: 2.4   Smokeless tobacco: Never  Vaping Use   Vaping Use: Never used  Substance and Sexual Activity   Alcohol use: Not Currently    Comment: 2 bottles of wine on 12/08/2019 (one occurrence)   Drug use: Not Currently    Types: Cocaine    Comment: last use several months ago   Sexual activity: Not Currently  Other Topics Concern   Not on file  Social History Narrative   ** Merged History Encounter **       Social Determinants of Health   Financial Resource Strain:  Not on file  Food Insecurity: Not on file  Transportation Needs: Not on file  Physical Activity: Not on file  Stress: Not on file  Social Connections: Not on file  Intimate Partner Violence: Not on file    Outpatient Medications Prior to Visit  Medication Sig Dispense Refill   Accu-Chek Softclix Lancets lancets SMARTSIG:1 Topical 4 Times Daily     amLODipine (NORVASC) 10 MG tablet Take 1 tablet (10 mg total) by mouth daily. 90 tablet 3   aspirin EC 81 MG EC tablet Take 1 tablet (81 mg total) by mouth daily. (May buy from over the counter): Swallow whole for heart health 30 tablet 0   blood glucose meter kit and supplies KIT Dispense based on patient and insurance preference. Use up to four times daily as directed. (FOR ICD-9 250.00, 250.01). 1 each 0   Blood Pressure Monitor KIT 1 kit by Does not apply route daily. 1 kit 0   clopidogrel (PLAVIX) 75 MG tablet Take 1 tablet (75 mg total) by mouth daily. 90 tablet 1   EPINEPHrine 0.3 mg/0.3 mL IJ SOAJ injection Inject 0.3 mg into the muscle as needed for anaphylaxis. 1 each 2   glucose blood (ACCU-CHEK GUIDE) test strip Use as instructed 100 each 12   LIVALO 4 MG TABS Take 1 tablet  (4 mg total) by mouth daily. 90 tablet 3   metFORMIN (GLUCOPHAGE) 500 MG tablet TAKE 1 TABLET BY MOUTH TWICE DAILY WITH A MEAL 180 tablet 2   metoprolol tartrate (LOPRESSOR) 100 MG tablet Take 1 tablet (100 mg total) by mouth 2 (two) times daily. 180 tablet 3   nitroGLYCERIN (NITROSTAT) 0.4 MG SL tablet Place 1 tablet (0.4 mg total) under the tongue every 5 (five) minutes x 3 doses as needed for chest pain. 25 tablet 3   SF 5000 PLUS 1.1 % CREA dental cream See admin instructions.     Testosterone (ANDROGEL PUMP) 20.25 MG/ACT (1.62%) GEL Place 1 Pump onto the skin daily. 75 g 2   nicotine (NICODERM CQ) 21 mg/24hr patch Place 1 patch (21 mg total) onto the skin daily. (Patient not taking: Reported on 03/05/2021) 28 patch 1   No facility-administered medications prior to visit.    Allergies  Allergen Reactions   Bee Venom Anaphylaxis and Hives   Shellfish Allergy Anaphylaxis and Hives   Statins Other (See Comments)    Myalgias. Tolerating livalo. Pain    Statins    Testosterone Cypionate     Testerone Injection --Increased breast tissue     ROS Review of Systems    Objective:    Physical Exam  BP 122/86   Pulse (!) 56   Temp (!) 97.2 F (36.2 C)   Ht 5' 8"  (1.727 m)   Wt 188 lb 3.2 oz (85.4 kg)   SpO2 100%   BMI 28.62 kg/m  Wt Readings from Last 3 Encounters:  03/05/21 188 lb 3.2 oz (85.4 kg)  02/12/21 189 lb (85.7 kg)  01/21/21 188 lb 0.4 oz (85.3 kg)     Health Maintenance Due  Topic Date Due   COVID-19 Vaccine (1) Never done   Zoster Vaccines- Shingrix (1 of 2) Never done   Pneumococcal Vaccine 78-60 Years old (3 - PCV) 07/28/2018   FOOT EXAM  03/03/2019   OPHTHALMOLOGY EXAM  11/16/2019   INFLUENZA VACCINE  01/27/2021    There are no preventive care reminders to display for this patient.  Lab Results  Component Value Date  TSH 1.089 03/19/2020   Lab Results  Component Value Date   WBC 5.9 10/31/2020   HGB 14.5 10/31/2020   HCT 44.2 10/31/2020   MCV  85 10/31/2020   PLT 246 10/31/2020   Lab Results  Component Value Date   NA 139 10/31/2020   K 4.4 10/31/2020   CO2 22 03/24/2020   GLUCOSE 97 10/31/2020   BUN 11 10/31/2020   CREATININE 1.04 10/31/2020   BILITOT 0.5 10/31/2020   ALKPHOS 72 10/31/2020   AST 19 10/31/2020   ALT 31 03/17/2020   PROT 6.9 10/31/2020   ALBUMIN 4.8 10/31/2020   CALCIUM 9.9 10/31/2020   ANIONGAP 12 03/24/2020   EGFR 83 10/31/2020   Lab Results  Component Value Date   CHOL 161 10/31/2020   Lab Results  Component Value Date   HDL 44 10/31/2020   Lab Results  Component Value Date   LDLCALC 99 10/31/2020   Lab Results  Component Value Date   TRIG 96 10/31/2020   Lab Results  Component Value Date   CHOLHDL 3.7 10/31/2020   Lab Results  Component Value Date   HGBA1C 5.0 03/05/2021   HGBA1C 5.0 03/05/2021   HGBA1C 5.0 (A) 03/05/2021   HGBA1C 5.0 03/05/2021      Assessment & Plan:   Problem List Items Addressed This Visit       Endocrine   DM2 (diabetes mellitus, type 2) ([place]) - Primary (Chronic)   Relevant Orders   POCT URINALYSIS DIP (CLINITEK) (Completed)   HgB A1c (Completed)   Glucose (CBG) (Completed)    No orders of the defined types were placed in this encounter.   Follow-up: No follow-ups on file.    Vevelyn Francois, NP

## 2021-03-06 LAB — COMP. METABOLIC PANEL (12)
AST: 24 IU/L (ref 0–40)
Albumin/Globulin Ratio: 2.3 — ABNORMAL HIGH (ref 1.2–2.2)
Albumin: 5 g/dL — ABNORMAL HIGH (ref 3.8–4.9)
Alkaline Phosphatase: 64 IU/L (ref 44–121)
BUN/Creatinine Ratio: 14 (ref 9–20)
BUN: 13 mg/dL (ref 6–24)
Bilirubin Total: 0.4 mg/dL (ref 0.0–1.2)
Calcium: 9.7 mg/dL (ref 8.7–10.2)
Chloride: 103 mmol/L (ref 96–106)
Creatinine, Ser: 0.92 mg/dL (ref 0.76–1.27)
Globulin, Total: 2.2 g/dL (ref 1.5–4.5)
Glucose: 103 mg/dL — ABNORMAL HIGH (ref 65–99)
Potassium: 4.8 mmol/L (ref 3.5–5.2)
Sodium: 142 mmol/L (ref 134–144)
Total Protein: 7.2 g/dL (ref 6.0–8.5)
eGFR: 96 mL/min/{1.73_m2} (ref >59)

## 2021-03-06 LAB — LIPID PANEL
Chol/HDL Ratio: 3.5 ratio (ref 0.0–5.0)
Cholesterol, Total: 152 mg/dL (ref 100–199)
HDL: 44 mg/dL (ref >39)
LDL Chol Calc (NIH): 90 mg/dL (ref 0–99)
Triglycerides: 99 mg/dL (ref 0–149)
VLDL Cholesterol Cal: 18 mg/dL (ref 5–40)

## 2021-03-11 ENCOUNTER — Ambulatory Visit (INDEPENDENT_AMBULATORY_CARE_PROVIDER_SITE_OTHER): Payer: Medicaid Other | Admitting: Internal Medicine

## 2021-03-11 ENCOUNTER — Encounter: Payer: Self-pay | Admitting: Internal Medicine

## 2021-03-11 VITALS — BP 128/78 | HR 68 | Ht 68.0 in | Wt 189.2 lb

## 2021-03-11 DIAGNOSIS — I2510 Atherosclerotic heart disease of native coronary artery without angina pectoris: Secondary | ICD-10-CM

## 2021-03-11 DIAGNOSIS — I10 Essential (primary) hypertension: Secondary | ICD-10-CM

## 2021-03-11 DIAGNOSIS — E119 Type 2 diabetes mellitus without complications: Secondary | ICD-10-CM | POA: Diagnosis not present

## 2021-03-11 DIAGNOSIS — Z9861 Coronary angioplasty status: Secondary | ICD-10-CM

## 2021-03-11 DIAGNOSIS — E785 Hyperlipidemia, unspecified: Secondary | ICD-10-CM

## 2021-03-11 NOTE — Patient Instructions (Signed)
Medication Instructions:  Continue same medications *If you need a refill on your cardiac medications before your next appointment, please call your pharmacy*   Lab Work: Have fasting Lipid Panel in 6 months  March 2023  Lab order enclosed   Testing/Procedures: None  ordered   Follow-Up: At [facility], you and your health needs are our priority.  As part of our continuing mission to provide you with exceptional heart care, we have created designated Provider Care Teams.  These Care Teams include your primary Cardiologist (physician) and Advanced Practice Providers (APPs -  Physician Assistants and Nurse Practitioners) who all work together to provide you with the care you need, when you need it.  We recommend signing up for the patient portal called "MyChart".  Sign up information is provided on this After Visit Summary.  MyChart is used to connect with patients for Virtual Visits (Telemedicine).  Patients are able to view lab/test results, encounter notes, upcoming appointments, etc.  Non-urgent messages can be sent to your provider as well.   To learn more about what you can do with MyChart, go to [email or web address].     Your next appointment:  1 year    Call in July to schedule Sept appointment    The format for your next appointment: Office   Provider:  Dr.Hilty

## 2021-03-11 NOTE — Progress Notes (Signed)
OFFICE NOTE  Chief Complaint:  Follow-up chest pain  Primary Care Physician: Vevelyn Francois, NP  HPI:  [name] a 60 year old gentleman who has a history of coronary disease, hypertension, dyslipidemia, diabetes type 2, and microvascular angina. He has had multiple PCIs, in-stent restenosis, but has been actually fairly stable since his last drug-eluting stent placement in June 2013. He had a nuclear stress test which showed some mild abnormalities and was considered low risk and has been treated medically.  He had chest pain in January 2014 and was seen at [facility]; however, no intervention was performed at that time. Recently he had some dyspepsia treated by a PPI which has resolved. He has had a little bit of weight gain and decreased exercise, which he is working on, and some dietary indiscretion. He also had a metabolic test in our office just on August 08, 2012, which showed good exercise effort, peak VO2 of 79% predicted with normal VO2 heart-rate curves indicating only mild deconditioning but no evidence of ischemia.   Since his last office visit he is adopted a vegetarian lifestyle and has managed to lose almost 20 pounds. He continues to exercise and his made great strides as far as his symptoms are concerned. He still has occasional episodes of angina which are nitrate responsive. He is concerned however about taking higher doses of Imdur as he read that it may not be effective in the long term.  He is inquiring about possibly coming off the medication if possible. He is no longer taking little low as he has made dietary changes and wishes to use a natural approach rather than statin medication.  I've reminded him that because he has significant coronary disease and multiple stents that statins are still indicated despite dietary changes.  [name] is doing well today. Denies any chest pain. He is interested in seeing what his cholesterol is coming down to now that he's been on a  vegan and diet. He is not on any cholesterol medications. He also reports some sexual dysfunction and is interested in Viagra.  01/13/2016  [name] returns today for follow-up. He is doing exceedingly well. He continues to exercise. He had switched to a vegan diet but recently had to introduce some dairy. His weight is down another 10 pounds and again he has no anginal symptoms at all. He is interested actually in coming off of Plavix today. It's been more than a year since his stent and since he's been free of angina I think that's very reasonable. Recent cholesterol testing shows marked improvement in his numbers including an LDL of 66 and total cholesterol of 132.  01/04/2017  [name] was seen today in follow-up. He continues to do well. He denies any new chest pain or worsening shortness of breath. He continues exercise regularly. Weight is down further from 175-168 and he is essentially at goal. Blood pressure is well-controlled today 114/82. His EKG is personally reviewed shows sinus bradycardia 50 with no ischemic changes. He recently had lab work showed total cholesterol 195, Trilisate 87, HDL 46 and LDL-C at 132. Of note his LDL-C was 66 last year. When queried about this he reported his insurance, he said that he did not need the medicine anymore and would not cover it. Clearly although he is optimize regarding diet and exercise his cholesterol remains elevated. His goal LDL-C is less than 70 given his coronary artery disease and he will not recheck goal without medication. Unfortunately he  was intolerant to a number of statins in the past including atorvastatin, simvastatin and rosuvastatin. He's also recently been complaining of some peripheral neuropathy. He is not on medication yet for this but reports numbness and tingling in his hands and feet. He would live and A1c however is now better controlled at 5.4.  11/15/2018  [name] returns today for follow-up.  He is complaining of some  persistent chest pain that seems to started after his recent coronary intervention.  In April he was seen in the hospital with recurrent chest pain.  He underwent left heart catheterization was found to have in-stent restenosis of the circumflex and OM branches which was intervened upon using a cutting balloon angioplasty.  After that he had resolution of his symptoms which were mostly with exertion and relieved by rest.  Since then he has had chest discomfort which is somewhat persistent.  It does not seem to be worse with exertion or relieved by rest, for example he was able to ride his bicycle to the office today without any issues, but says that he can have chest discomfort sitting in bed and reading a book.  He was seen in the office by Almyra Deforest, PA-C who placed him on long-acting Imdur.  This did not help his symptoms.  Subsequently he was seen in the ER who uptitrated his Imdur from 30 to 60 mg and increased his beta-blocker.  Although his blood pressure is better and heart rate is lower, his chest pain has not improved.  He describes it as a right-sided chest discomfort which is not worse with taking a deep breath or movement and again is more often at rest.  It does not radiate.  05/07/2020  [name] is seen today in follow-up.  He was treated earlier this year and had an angioplasty to the circumflex/OM 2 for in-stent restenosis which decreased the degree of stenosis from 95 to 30%.  Dual antiplatelet therapy with aspirin and Brilinta was recommended for least a year which will be reevaluated in April.  Ultimately he may remain on aspirin and Plavix.  He was also seen more recently in the emergency department for chest pain thought to be atypical.  I recommended Ranexa to the ER physician however he never started on the medicine.  Since then though he says his symptoms have improved.  He denies any recurrent chest pain.  He is made some dietary modifications.  He still active and rides his bicycle every  day.  Unfortunately he has had some falls due to back problems and has a need for upcoming surgery to his right great toe.  Pressures well controlled today.  Recent lipids showed total cholesterol of 168, HDL 45, LDL 76 and triglycerides 237.  This was also associated with an A1c increase however he has been started on Metformin and is on October 4 A1c is 6.  03/11/2021  [name] returns today for follow-up.  He continues to do well.  He is now more than a year and a half out from angioplasty.  I did switch him from Brilinta over to Plavix.  Given the extent and recurrence of in-stent restenosis, I would favor keeping him on indefinite aspirin and Plavix.  The Plavix could be held if necessary for procedures.  Overall he denies any chest pain.  He says he is physically active.  His main issue struggling with some headaches.  He is scheduled to see a neurologist in October.  Blood pressure was good today.  His cholesterol is trended up a little with total 152, HDL 44, triglycerides 99 and LDL 90, this was obtained just a few days ago.  He is on :Livalo 4 mg daily.   PMHx:  Past Medical History:  Diagnosis Date   Alcohol dependence ([place])    Allergies    Arthritis    Chest pain    Chronic lower back pain    Chronic pain of right wrist    Coronary artery disease    a. Multiple prior caths/PCI. Cath 2013 with possible spasm of RCA, 70% ISR of mid LCx with subsequent DES to mLCx and prox LCX. b. H/o microvascular angina. c. Recurrent angina 08/2014 - s/p PTCA/DES to prox Cx, PTCA/CBA to OM1.  c. LHC 06/10/15 with patent stents and some ISR in LCX and OM-1 that was not flow limiting --> Rx    Dyslipidemia    a. Intolerant to many statins except tolerating Livalo.   GERD (gastroesophageal reflux disease)    H/O cardiac catheterization 10/25/2018   Heart attack [facility])    Hypertension    Myocardial infarction [facility]) ~ 2010   S/P angioplasty with stent, DES, to proximal and mid LCX 12/15/11 12/15/2011   S/P  foot surgery, right 04/2021   Shoulder pain    Stroke ([place])    pt. reports had a stroke around time of MI 2010   Type II diabetes mellitus ([place])    Unstable angina [facility])     Past Surgical History:  Procedure Laterality Date   CARDIAC CATHETERIZATION  06/15/2002   LAD with prox 40% stenosis, norma L main, Cfx with 25% lesion, RCA with long mid 25% stenosis (Dr. Vita Barley)   CARDIAC CATHETERIZATION  04/01/2010   normal L main, LAD wit mild stenosis, L Cfx with 70% in-stent restenosis, RCA with 70% in-stent restenosis, LVEF >60% (Dr. K. Mali Demetri Kerman) - cutting ballon arthrectomy to RCA & Cfx (Dr. Rockne Menghini)   CARDIAC CATHETERIZATION  08/25/2010   preserved global LV contractility; multivessel CAD, diffuse 90-95% in-stent restenosis in prox placed Cfx stent - cutting balloon arthrectomy in Cfx with multiple dilatations 90-95% to 0% stenosis (Dr. Corky Downs)   CARDIAC CATHETERIZATION  01/26/2011   PCI & stenting of aggresive in-stent restenosis within previously stented AV groove Cfx with 3.0x55m Taxus DES (previous stents were Promus) (Dr. JAdora Fridge   CARDIAC CATHETERIZATION  05/11/2011   preserved LV function, 40% mid LAD stenosis, 30-40% narrowing proximal to stented semgnet of prox Cfx, patent mid RCA stent with smooth 20% narrowing in distal RCA (Dr. TCorky Downs   CARDIAC CATHETERIZATION  12/15/2011   PCI & stenting of proximal & mid Cfx with DES - 3.0x187min proximal, 3.0x1544mn mid (Dr. J. Adora Fridge CARCrosbyA 06/10/2015   Procedure: Left Heart Cath and Coronary Angiography;  Surgeon: DanJolaine ArtistD;  Location: [facility];  Service: Cardiovascular;  Laterality: N/A;   CARDIAC CATHETERIZATION  04/00/86/7619cardiometabolic testing  08/14/07/3267good exercise effort, peak VO2 79% predicted with normal VO2 HR curves (mild deconditioning)   COLONOSCOPY  12/2012   diminutive hyperplastic sigmoid poyp so repeat routine 2024   CORONARY BALLOON ANGIOPLASTY N/A  10/25/2018   Procedure: CORONARY BALLOON ANGIOPLASTY;  Surgeon: VarJettie BoozeD;  Location: [facility];  Service: Cardiovascular;  Laterality: N/A;   CORONARY BALLOON ANGIOPLASTY N/A 09/29/2019   Procedure: CORONARY BALLOON ANGIOPLASTY;  Surgeon: EndNelva BushD;  Location: [facility]  LAB;  Service: Cardiovascular;  Laterality: N/A;   EXCISIONAL HEMORRHOIDECTOMY  1984   INTRAVASCULAR ULTRASOUND/IVUS N/A 09/29/2019   Procedure: Intravascular Ultrasound/IVUS;  Surgeon: Nelva Bush, MD;  Location: [place] [facility] LAB;  Service: Cardiovascular;  Laterality: N/A;   LEFT HEART CATH AND CORONARY ANGIOGRAPHY N/A 10/25/2018   Procedure: LEFT HEART CATH AND CORONARY ANGIOGRAPHY;  Surgeon: Jettie Booze, MD;  Location: [place] [facility];  Service: Cardiovascular;  Laterality: N/A;   LEFT HEART CATH AND CORONARY ANGIOGRAPHY N/A 09/29/2019   Procedure: LEFT HEART CATH AND CORONARY ANGIOGRAPHY;  Surgeon: Nelva Bush, MD;  Location: [place] [facility] LAB;  Service: Cardiovascular;  Laterality: N/A;   LEFT HEART CATHETERIZATION WITH CORONARY ANGIOGRAM N/A 05/11/2011   Procedure: LEFT HEART CATHETERIZATION WITH CORONARY ANGIOGRAM;  Surgeon: Troy Sine, MD;  Location: [facility] CATH LAB;  Service: Cardiovascular;  Laterality: N/A;  Possible percutaneous coronary intervention, possible IVUS   LEFT HEART CATHETERIZATION WITH CORONARY ANGIOGRAM N/A 12/15/2011   Procedure: LEFT HEART CATHETERIZATION WITH CORONARY ANGIOGRAM;  Surgeon: Lorretta Harp, MD;  Location: [facility] CATH LAB;  Service: Cardiovascular;  Laterality: N/A;   LEFT HEART CATHETERIZATION WITH CORONARY ANGIOGRAM N/A 09/05/2014   Procedure: LEFT HEART CATHETERIZATION WITH CORONARY ANGIOGRAM;  Surgeon: Burnell Blanks, MD;  Location: [facility] CATH LAB;  Service: Cardiovascular;  Laterality: N/A;   LIPOMA EXCISION     back of the head   NM MYOCAR PERF WALL MOTION  02/2012   lexiscan myoview; mild perfusion defect in mid inferolateral  & basal inferolateral region (infarct/scar); EF 52%, abnormal but ow risk scan   PERCUTANEOUS CORONARY STENT INTERVENTION (PCI-S)  09/05/2014   Procedure: PERCUTANEOUS CORONARY STENT INTERVENTION (PCI-S);  Surgeon: Burnell Blanks, MD;  Location: [facility] CATH LAB;  Service: Cardiovascular;;   TONSILLECTOMY      FAMHx:  Family History  Problem Relation Age of Onset   Leukemia Mother    Prostate cancer Father    Coronary artery disease Paternal Grandmother    Cancer Paternal Grandfather    Cancer Brother     SOCHx:   reports that he quit smoking about 2 years ago. His smoking use included cigarettes. He has a 10.00 pack-year smoking history. He has never used smokeless tobacco. He reports that he does not currently use alcohol. He reports that he does not currently use drugs after having used the following drugs: Cocaine.  ALLERGIES:  Allergies  Allergen Reactions   Bee Venom Anaphylaxis and Hives   Shellfish Allergy Anaphylaxis and Hives   Statins Other (See Comments)    Myalgias. Tolerating livalo. Pain    Statins    Testosterone Cypionate     Testerone Injection --Increased breast tissue     ROS: Pertinent items noted in HPI and remainder of comprehensive ROS otherwise negative.  HOME MEDS: Current Outpatient Medications  Medication Sig Dispense Refill   Accu-Chek Softclix Lancets lancets SMARTSIG:1 Topical 4 Times Daily     amLODipine (NORVASC) 10 MG tablet Take 1 tablet (10 mg total) by mouth daily. 90 tablet 3   aspirin EC 81 MG EC tablet Take 1 tablet (81 mg total) by mouth daily. (May buy from over the counter): Swallow whole for heart health 30 tablet 0   blood glucose meter kit and supplies KIT Dispense based on patient and insurance preference. Use up to four times daily as directed. (FOR ICD-9 250.00, 250.01). 1 each 0   Blood Pressure Monitor KIT 1 kit by Does not apply route daily. 1 kit  0   clopidogrel (PLAVIX) 75 MG tablet Take 1 tablet (75 mg total) by mouth  daily. 90 tablet 1   EPINEPHrine 0.3 mg/0.3 mL IJ SOAJ injection Inject 0.3 mg into the muscle as needed for anaphylaxis. 1 each 2   glucose blood (ACCU-CHEK GUIDE) test strip Use as instructed 100 each 12   LIVALO 4 MG TABS Take 1 tablet (4 mg total) by mouth daily. 90 tablet 3   metFORMIN (GLUCOPHAGE) 500 MG tablet TAKE 1 TABLET BY MOUTH TWICE DAILY WITH A MEAL 180 tablet 2   metoprolol tartrate (LOPRESSOR) 100 MG tablet Take 0.5 tablets (50 mg total) by mouth 2 (two) times daily. 180 tablet 3   nitroGLYCERIN (NITROSTAT) 0.4 MG SL tablet Place 1 tablet (0.4 mg total) under the tongue every 5 (five) minutes x 3 doses as needed for chest pain. 25 tablet 3   SF 5000 PLUS 1.1 % CREA dental cream See admin instructions.     Testosterone (ANDROGEL PUMP) 20.25 MG/ACT (1.62%) GEL Place 1 Pump onto the skin daily. 75 g 2   No current facility-administered medications for this visit.    LABS/IMAGING: No results found for this or any previous visit (from the past 48 hour(s)). No results found.  VITALS: BP 128/78   Pulse 68   Ht _0  (1.727 m)   Wt 189 lb 3.2 oz (85.8 kg)   SpO2 99%   BMI 28.77 kg/m   EXAM: General appearance: alert and no distress Neck: no carotid bruit and no JVD Lungs: clear to auscultation bilaterally Heart: regular rate and rhythm, S1, S2 normal, no murmur, click, rub or gallop Abdomen: soft, non-tender; bowel sounds normal; no masses,  no organomegaly Extremities: extremities normal, atraumatic, no cyanosis or edema Pulses: 2+ and symmetric Skin: Skin color, texture, turgor normal. No rashes or lesions Neurologic: Grossly normal Psych: Pleasant  EKG: Normal sinus rhythm at 68 no ischemia-personally reviewed  ASSESSMENT: Recurrent chest pain -PCI for in-stent restenosis to the circumflex/OM 2 (09/2019) Recent unstable angina status post PTCA to the circumflex and marginal branches for in-stent restenosis Coronary artery disease status post multiple PCI as with  the last stent to the circumflex in 2013 Dyslipidemia-on Livalo (intolerant to atorvastatin, simvastatin and rosuvastatin) Hypertension Diabetes type 2 - with peripheral neuropathy Erectile dysfunction  PLAN: 1.   [name] continues to do well without any recurrent chest pain.  He is now more than a year and a half out from his recent PCI for in-stent restenosis.  I would favor continuing him on indefinite aspirin and Plavix dual antiplatelet therapy.  His cholesterol is still higher than target although he can tolerate Livalo, he has been intolerant to other statins.  We discussed possibility of ezetimibe however he felt that he could improve his diet further.  We will plan a repeat lipid in about 6 months and consider additional therapy unless he is able to reach a target LDL less than 70.  Blood pressure is well controlled today.  His recent A1c was 5.0% indicating good blood sugar control.  Follow-up annually or sooner as necessary  Pixie Casino, MD, FACC, [place] Director of the [facility] Lip[facility]  Cardiovascular [facility] Diplomate of the American Board of Clinical Lipidology Attending Cardiologist  Direct Dial: [phone]  Fax: [phone]  Website:  [email or web address] 03/11/2021, 8:43 AM

## 2021-04-01 ENCOUNTER — Ambulatory Visit: Payer: Medicaid Other | Admitting: Neurology

## 2021-04-01 ENCOUNTER — Telehealth: Payer: Self-pay | Admitting: Neurology

## 2021-04-01 ENCOUNTER — Encounter: Payer: Self-pay | Admitting: Neurology

## 2021-04-01 ENCOUNTER — Other Ambulatory Visit: Payer: Self-pay

## 2021-04-01 VITALS — BP 128/89 | HR 61 | Ht 68.0 in | Wt 193.6 lb

## 2021-04-01 DIAGNOSIS — R42 Dizziness and giddiness: Secondary | ICD-10-CM

## 2021-04-01 DIAGNOSIS — Z955 Presence of coronary angioplasty implant and graft: Secondary | ICD-10-CM

## 2021-04-01 DIAGNOSIS — Z8673 Personal history of transient ischemic attack (TIA), and cerebral infarction without residual deficits: Secondary | ICD-10-CM

## 2021-04-01 DIAGNOSIS — R0683 Snoring: Secondary | ICD-10-CM | POA: Diagnosis not present

## 2021-04-01 DIAGNOSIS — R351 Nocturia: Secondary | ICD-10-CM | POA: Diagnosis not present

## 2021-04-01 DIAGNOSIS — R519 Headache, unspecified: Secondary | ICD-10-CM

## 2021-04-01 NOTE — Telephone Encounter (Signed)
Medicaid order sent to [facility], [facility] they will reach out to the patient to schedule.
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
## 2021-04-01 NOTE — Patient Instructions (Signed)
It was nice to see you today.    Here is what we discussed today and what we came up with as our plan for you:    Based on your symptoms and your exam I believe you are at risk for obstructive sleep apnea (aka OSA), and I think we should proceed with a sleep study to determine whether you do or do not have OSA and how severe it is. Even, if you have mild OSA, I may want you to consider treatment with CPAP, as treatment of even borderline or mild sleep apnea can result and improvement of symptoms such as sleep disruption, daytime sleepiness, nighttime bathroom breaks, restless leg symptoms, improvement of headache syndromes, even improved mood disorder.   As explained, an attended sleep study meaning you get to stay overnight in the sleep lab, lets [place] monitor sleep-related behaviors such as sleep talking and leg movements in sleep, in addition to monitoring for sleep apnea.  A home sleep test is a screening tool for sleep apnea only, and unfortunately does not help with any other sleep-related diagnoses.  Please remember, the long-term risks and ramifications of untreated moderate to severe obstructive sleep apnea are: increased Cardiovascular disease, including congestive heart failure, stroke, difficult to control hypertension, treatment resistant obesity, arrhythmias, especially irregular heartbeat commonly known as A. Fib. (atrial fibrillation); even type 2 diabetes has been linked to untreated OSA.   Sleep apnea can cause disruption of sleep and sleep deprivation in most cases, which, in turn, can cause recurrent headaches, problems with memory, mood, concentration, focus, and vigilance. Most people with untreated sleep apnea report excessive daytime sleepiness, which can affect their ability to drive. Please do not drive if you feel sleepy. Patients with sleep apnea can also develop difficulty initiating and maintaining sleep (aka insomnia).   Your neurological exam is normal.  You are up-to-date  with your eye exam as well.  I would like to proceed with a brain MRI with and without contrast to rule out a structural cause for your recurrent headaches.  Your history does not suggest migraines.  We will call you with your test results and plan a follow-up afterwards.

## 2021-04-09 ENCOUNTER — Ambulatory Visit
Admission: RE | Admit: 2021-04-09 | Discharge: 2021-04-09 | Disposition: A | Discharge status: Home / Self Care | Payer: Medicaid Other | Source: Ambulatory Visit | Attending: Neurology | Admitting: Neurology

## 2021-04-09 ENCOUNTER — Other Ambulatory Visit: Payer: Self-pay

## 2021-04-09 DIAGNOSIS — R351 Nocturia: Secondary | ICD-10-CM

## 2021-04-09 DIAGNOSIS — R42 Dizziness and giddiness: Secondary | ICD-10-CM

## 2021-04-09 DIAGNOSIS — R519 Headache, unspecified: Secondary | ICD-10-CM

## 2021-04-09 DIAGNOSIS — Z955 Presence of coronary angioplasty implant and graft: Secondary | ICD-10-CM

## 2021-04-09 DIAGNOSIS — R0683 Snoring: Secondary | ICD-10-CM

## 2021-04-09 DIAGNOSIS — Z8673 Personal history of transient ischemic attack (TIA), and cerebral infarction without residual deficits: Secondary | ICD-10-CM

## 2021-04-09 MED ORDER — GADOBENATE DIMEGLUMINE 529 MG/ML IV SOLN
18.0000 mL | Freq: Once | INTRAVENOUS | Status: AC | PRN
  Administered 2021-04-09: 18 mL via INTRAVENOUS

## 2021-04-09 MED ORDER — DIPHENHYDRAMINE HCL 50 MG/ML IJ SOLN
50.0000 mg | Freq: Once | INTRAMUSCULAR | Status: AC
  Administered 2021-04-09: 50 mg via INTRAVENOUS

## 2021-04-09 NOTE — Progress Notes (Signed)
Patient ID: [name], male   DOB: 1961-02-02, 60 y.o.   MRN: [id number]  I was called to see the patient in the nursing area of [place] for a suspected contrast reaction following an MRI where he received IV MultiHance. The patient had predominantly infraorbital soft tissue swelling on the right with associated itching. He was given 50 mg IV Benadryl and vital signs monitored. He had no other symptoms and otherwise appeared well. The itching resolved during the observation period, and the swelling was improving at the time discharge. He was advised to seek immediate medical attention for any new or worsening symptoms.

## 2021-04-09 NOTE — Progress Notes (Signed)
Pt was brought over to the nursing area after MR with contrast. Cecille Rubin, MR tech noted the patients right eye was swollen after his scan and walked him to the nursing area. Pts right eye was visibly swollen but the patient could see through it. No other swelling was noted. Pt did report itching to the one eye but denied any tongue swelling, lip swelling, difficulty breathing. No hives or rash were noted. The patient was given IV 50 mg benadryl per Dr. Jeralyn Ruths. The patients vital signs were obtained and was watched in the nursing area until d/ced by Dr. Jeralyn Ruths. Pts eye swelling appeared to be better at time of discharge.

## 2021-04-10 ENCOUNTER — Telehealth: Payer: Self-pay | Admitting: *Deleted

## 2021-04-10 NOTE — Telephone Encounter (Signed)
Called patient and LVM (Ok per [facility]) advising the patient of his MRI brain results as noted below by Dr Rexene Alberts. Left office number for call back if he has any questions but also for him to call if he does not hear within 1-2 weeks about scheduling sleep study. Also advised results are on mychart for his review.

## 2021-04-10 NOTE — Telephone Encounter (Signed)
-----   Message from [name], MD sent at 04/10/2021  8:38 AM EDT ----- Please call patient and advise him that his recent brain MRI with and without contrast was reported as normal. We proceed with sleep testing as discussed.  Please ask him to reach out to [place] if he has not heard within the next week or 2 about scheduling a sleep study.

## 2021-04-21 ENCOUNTER — Ambulatory Visit: Payer: Self-pay | Admitting: Nurse Practitioner

## 2021-04-24 ENCOUNTER — Other Ambulatory Visit: Payer: Self-pay

## 2021-04-24 ENCOUNTER — Ambulatory Visit (INDEPENDENT_AMBULATORY_CARE_PROVIDER_SITE_OTHER): Payer: Medicaid Other | Admitting: Nurse Practitioner

## 2021-04-24 ENCOUNTER — Encounter: Payer: Self-pay | Admitting: Nurse Practitioner

## 2021-04-24 VITALS — BP 129/89 | HR 60 | Temp 97.2°F | Ht 68.0 in | Wt 193.6 lb

## 2021-04-24 DIAGNOSIS — R5383 Other fatigue: Secondary | ICD-10-CM

## 2021-04-24 DIAGNOSIS — E291 Testicular hypofunction: Secondary | ICD-10-CM | POA: Diagnosis not present

## 2021-04-24 DIAGNOSIS — E1159 Type 2 diabetes mellitus with other circulatory complications: Secondary | ICD-10-CM

## 2021-04-24 DIAGNOSIS — I10 Essential (primary) hypertension: Secondary | ICD-10-CM | POA: Diagnosis not present

## 2021-04-24 MED ORDER — ACCU-CHEK GUIDE VI STRP: ORAL_STRIP | 3 refills | Status: DC

## 2021-04-24 MED ORDER — SILDENAFIL CITRATE 100 MG PO TABS
100.0000 mg | ORAL_TABLET | Freq: Every day | ORAL | 5 refills | Status: DC | PRN
Start: 2021-04-24 — End: 2022-03-06

## 2021-04-24 MED ORDER — TESTOSTERONE 20.25 MG/ACT (1.62%) TD GEL: 1.0000 | Freq: Every day | TRANSDERMAL | 2 refills | Status: DC

## 2021-04-24 NOTE — Patient Instructions (Signed)
Diabetes Mellitus and Nutrition, Adult When you have diabetes, or diabetes mellitus, it is very important to have healthy eating habits because your blood sugar (glucose) levels are greatly affected by what you eat and drink. Eating healthy foods in the right amounts, at about the same times every day, can help you: Control your blood glucose. Lower your risk of heart disease. Improve your blood pressure. Reach or maintain a healthy weight. What can affect my meal plan? Every person with diabetes is different, and each person has different needs for a meal plan. Your health care provider may recommend that you work with a dietitian to make a meal plan that is best for you. Your meal plan may vary depending on factors such as: The calories you need. The medicines you take. Your weight. Your blood glucose, blood pressure, and cholesterol levels. Your activity level. Other health conditions you have, such as heart or kidney disease. How do carbohydrates affect me? Carbohydrates, also called carbs, affect your blood glucose level more than any other type of food. Eating carbs naturally raises the amount of glucose in your blood. Carb counting is a method for keeping track of how many carbs you eat. Counting carbs is important to keep your blood glucose at a healthy level, especially if you use insulin or take certain oral diabetes medicines. It is important to know how many carbs you can safely have in each meal. This is different for every person. Your dietitian can help you calculate how many carbs you should have at each meal and for each snack. How does alcohol affect me? Alcohol can cause a sudden decrease in blood glucose (hypoglycemia), especially if you use insulin or take certain oral diabetes medicines. Hypoglycemia can be a life-threatening condition. Symptoms of hypoglycemia, such as sleepiness, dizziness, and confusion, are similar to symptoms of having too much alcohol. Do not drink  alcohol if: Your health care provider tells you not to drink. You are pregnant, may be pregnant, or are planning to become pregnant. If you drink alcohol: Do not drink on an empty stomach. Limit how much you use to: 0-1 drink a day for women. 0-2 drinks a day for men. Be aware of how much alcohol is in your drink. In the [place] one drink equals one 12 oz bottle of beer (355 mL), one 5 oz glass of wine (148 mL), or one 1 oz glass of hard liquor (44 mL). Keep yourself hydrated with water, diet soda, or unsweetened iced tea. Keep in mind that regular soda, juice, and other mixers may contain a lot of sugar and must be counted as carbs. What are tips for following this plan? Reading food labels Start by checking the serving size on the "Nutrition Facts" label of packaged foods and drinks. The amount of calories, carbs, fats, and other nutrients listed on the label is based on one serving of the item. Many items contain more than one serving per package. Check the total grams (g) of carbs in one serving. You can calculate the number of servings of carbs in one serving by dividing the total carbs by 15. For example, if a food has 30 g of total carbs per serving, it would be equal to 2 servings of carbs. Check the number of grams (g) of saturated fats and trans fats in one serving. Choose foods that have a low amount or none of these fats. Check the number of milligrams (mg) of salt (sodium) in one serving. Most people should limit  total sodium intake to less than 2,300 mg per day. Always check the nutrition information of foods labeled as "low-fat" or "nonfat." These foods may be higher in added sugar or refined carbs and should be avoided. Talk to your dietitian to identify your daily goals for nutrients listed on the label. Shopping Avoid buying canned, pre-made, or processed foods. These foods tend to be high in fat, sodium, and added sugar. Shop around the outside edge of the grocery store. This  is where you will most often find fresh fruits and vegetables, bulk grains, fresh meats, and fresh dairy. Cooking Use low-heat cooking methods, such as baking, instead of high-heat cooking methods like deep frying. Cook using healthy oils, such as olive, canola, or sunflower oil. Avoid cooking with butter, cream, or high-fat meats. Meal planning Eat meals and snacks regularly, preferably at the same times every day. Avoid going long periods of time without eating. Eat foods that are high in fiber, such as fresh fruits, vegetables, beans, and whole grains. Talk with your dietitian about how many servings of carbs you can eat at each meal. Eat 4-6 oz (112-168 g) of lean protein each day, such as lean meat, chicken, fish, eggs, or tofu. One ounce (oz) of lean protein is equal to: 1 oz (28 g) of meat, chicken, or fish. 1 egg.  cup (62 g) of tofu. Eat some foods each day that contain healthy fats, such as avocado, nuts, seeds, and fish. What foods should I eat? Fruits Berries. Apples. Oranges. Peaches. Apricots. Plums. Grapes. Mango. Papaya. Pomegranate. Kiwi. Cherries. Vegetables Lettuce. Spinach. Leafy greens, including kale, chard, collard greens, and mustard greens. Beets. Cauliflower. Cabbage. Broccoli. Carrots. Green beans. Tomatoes. Peppers. Onions. Cucumbers. Brussels sprouts. Grains Whole grains, such as whole-wheat or whole-grain bread, crackers, tortillas, cereal, and pasta. Unsweetened oatmeal. Quinoa. Brown or wild rice. Meats and other proteins Seafood. Poultry without skin. Lean cuts of poultry and beef. Tofu. Nuts. Seeds. Dairy Low-fat or fat-free dairy products such as milk, yogurt, and cheese. The items listed above may not be a complete list of foods and beverages you can eat. Contact a dietitian for more information. What foods should I avoid? Fruits Fruits canned with syrup. Vegetables Canned vegetables. Frozen vegetables with butter or cream sauce. Grains Refined  white flour and flour products such as bread, pasta, snack foods, and cereals. Avoid all processed foods. Meats and other proteins Fatty cuts of meat. Poultry with skin. Breaded or fried meats. Processed meat. Avoid saturated fats. Dairy Full-fat yogurt, cheese, or milk. Beverages Sweetened drinks, such as soda or iced tea. The items listed above may not be a complete list of foods and beverages you should avoid. Contact a dietitian for more information. Questions to ask a health care provider Do I need to meet with a diabetes educator? Do I need to meet with a dietitian? What number can I call if I have questions? When are the best times to check my blood glucose? Where to find more information: American Diabetes Association: diabetes.org Academy of Nutrition and Dietetics: www.eatright.[employer] of Diabetes and Digestive and Kidney Diseases: [employer] Association of Diabetes Care and Education Specialists: www.diabeteseducator.org Summary It is important to have healthy eating habits because your blood sugar (glucose) levels are greatly affected by what you eat and drink. A healthy meal plan will help you control your blood glucose and maintain a healthy lifestyle. Your health care provider may recommend that you work with a dietitian to make a meal plan that  is best for you. Keep in mind that carbohydrates (carbs) and alcohol have immediate effects on your blood glucose levels. It is important to count carbs and to use alcohol carefully. This information is not intended to replace advice given to you by your health care provider. Make sure you discuss any questions you have with your health care provider.

## 2021-04-24 NOTE — Progress Notes (Signed)
[place], [place]  [address] Phone:  [phone]   Fax:  [phone]   Established Patient Office Visit  Subjective:  Patient ID: [name], male    DOB: 03/17/1961  Age: 60 y.o. MRN: [id number]  CC:  Chief Complaint  Patient presents with   Follow-up    Follow up; Diabetes Pt is needing refills on his medications.     HPI [name] presents for follow up. He  has a past medical history of Alcohol dependence ([place]), Allergies, Arthritis, Chest pain, Chronic lower back pain, Chronic pain of right wrist, Coronary artery disease, Dyslipidemia, GERD (gastroesophageal reflux disease), H/O cardiac catheterization (10/25/2018), Heart attack ([place]), Hypertension, Myocardial infarction ([place]) (~ 2010), S/P angioplasty with stent, DES, to proximal and mid LCX 12/15/11 (12/15/2011), S/P foot surgery, right (04/2021), Shoulder pain, Stroke ([place]), Type II diabetes mellitus ([place]), and Unstable angina ([place]).   He continues to have fatigue. He is so tired all the timeHe feels like this is chronic fatigue related to diabetes.  He reports that during his research he did not see a lot of treatment options related to the fatigue.  He does do intermittent fasting from 10 PM to 3 PM. He is eating well. He feels like his CBG is higher in the morning 120-130.  Marland Kitchen He reports that this does affect his mood. He feels like this makes him confused. He continues to use the testosterone but he does not know if it is effective.  He did follow-up with cardiology recently.  He continues on the metoprolol 100 mg twice daily despite his heart rate being low.  Denies headache, dizziness, visual changes, shortness of breath, dyspnea on exertion, chest pain, nausea, vomiting or any edema.   Past Medical History:  Diagnosis Date   Alcohol dependence ([name])    Allergies    Arthritis    Chest pain    Chronic lower back pain    Chronic pain of right wrist    Coronary artery  disease    a. Multiple prior caths/PCI. Cath 2013 with possible spasm of RCA, 70% ISR of mid LCx with subsequent DES to mLCx and prox LCX. b. H/o microvascular angina. c. Recurrent angina 08/2014 - s/p PTCA/DES to prox Cx, PTCA/CBA to OM1.  c. LHC 06/10/15 with patent stents and some ISR in LCX and OM-1 that was not flow limiting --> Rx    Dyslipidemia    a. Intolerant to many statins except tolerating Livalo.   GERD (gastroesophageal reflux disease)    H/O cardiac catheterization 10/25/2018   Heart attack [facility])    Hypertension    Myocardial infarction [facility]) ~ 2010   S/P angioplasty with stent, DES, to proximal and mid LCX 12/15/11 12/15/2011   S/P foot surgery, right 04/2021   Shoulder pain    Stroke ([place])    pt. reports had a stroke around time of MI 2010   Type II diabetes mellitus ([place])    Unstable angina [facility])     Past Surgical History:  Procedure Laterality Date   CARDIAC CATHETERIZATION  06/15/2002   LAD with prox 40% stenosis, norma L main, Cfx with 25% lesion, RCA with long mid 25% stenosis (Dr. Vita Barley)   CARDIAC CATHETERIZATION  04/01/2010   normal L main, LAD wit mild stenosis, L Cfx with 70% in-stent restenosis, RCA with 70% in-stent restenosis, LVEF >60% (Dr. K. Mali Hilty) - cutting ballon arthrectomy to RCA & Cfx (Dr.  A. Little)   CARDIAC CATHETERIZATION  08/25/2010   preserved global LV contractility; multivessel CAD, diffuse 90-95% in-stent restenosis in prox placed Cfx stent - cutting balloon arthrectomy in Cfx with multiple dilatations 90-95% to 0% stenosis (Dr. Corky Downs)   CARDIAC CATHETERIZATION  01/26/2011   PCI & stenting of aggresive in-stent restenosis within previously stented AV groove Cfx with 3.0x50m Taxus DES (previous stents were Promus) (Dr. JAdora Fridge   CARDIAC CATHETERIZATION  05/11/2011   preserved LV function, 40% mid LAD stenosis, 30-40% narrowing proximal to stented semgnet of prox Cfx, patent mid RCA stent with smooth 20% narrowing in distal RCA (Dr.  TCorky Downs   CARDIAC CATHETERIZATION  12/15/2011   PCI & stenting of proximal & mid Cfx with DES - 3.0x19min proximal, 3.0x1552mn mid (Dr. J. Adora Fridge CAR[place]A 06/10/2015   Procedure: Left Heart Cath and Coronary Angiography;  Surgeon: DanJolaine ArtistD;  Location: [facility] [place];  Service: Cardiovascular;  Laterality: N/A;   CARDIAC CATHETERIZATION  04/54/09/8119cardiometabolic testing  08/09/45/8295good exercise effort, peak VO2 79% predicted with normal VO2 HR curves (mild deconditioning)   COLONOSCOPY  12/2012   diminutive hyperplastic sigmoid poyp so repeat routine 2024   CORONARY BALLOON ANGIOPLASTY N/A 10/25/2018   Procedure: CORONARY BALLOON ANGIOPLASTY;  Surgeon: VarJettie BoozeD;  Location: [facility] [place];  Service: Cardiovascular;  Laterality: N/A;   CORONARY BALLOON ANGIOPLASTY N/A 09/29/2019   Procedure: CORONARY BALLOON ANGIOPLASTY;  Surgeon: EndNelva BushD;  Location: [facility] [place] [facility];  Service: Cardiovascular;  Laterality: N/A;   EXCISIONAL HEMORRHOIDECTOMY  1984   INTRAVASCULAR ULTRASOUND/IVUS N/A 09/29/2019   Procedure: Intravascular Ultrasound/IVUS;  Surgeon: EndNelva BushD;  Location: [facility] [place] [facility];  Service: Cardiovascular;  Laterality: N/A;   LEFT HEART CATH AND CORONARY ANGIOGRAPHY N/A 10/25/2018   Procedure: LEFT HEART CATH AND CORONARY ANGIOGRAPHY;  Surgeon: VarJettie BoozeD;  Location: [facility] [place] [facility];  Service: Cardiovascular;  Laterality: N/A;   LEFT HEART CATH AND CORONARY ANGIOGRAPHY N/A 09/29/2019   Procedure: LEFT HEART CATH AND CORONARY ANGIOGRAPHY;  Surgeon: EndNelva BushD;  Location: [facility] [place];  Service: Cardiovascular;  Laterality: N/A;   LEFT HEART CATHETERIZATION WITH CORONARY ANGIOGRAM N/A 05/11/2011   Procedure: LEFT HEART CATHETERIZATION WITH CORONARY ANGIOGRAM;  Surgeon: ThoTroy SineD;  [facility];  Service: Cardiovascular;  Laterality: N/A;  Possible  percutaneous coronary intervention, possible IVUS   LEFT HEART CATHETERIZATION WITH CORONARY ANGIOGRAM N/A 12/15/2011   Procedure: LEFT HEART CATHETERIZATION WITH CORONARY ANGIOGRAM;  Surgeon: JonLorretta HarpD;  [facility];  Service: Cardiovascular;  Laterality: N/A;   LEFT HEART CATHETERIZATION WITH CORONARY ANGIOGRAM N/A 09/05/2014   Procedure: LEFT HEART CATHETERIZATION WITH CORONARY ANGIOGRAM;  Surgeon: ChrBurnell BlanksD;  [facility];  Service: Cardiovascular;  Laterality: N/A;   LIPOMA EXCISION     back of the head   NM MYOCAR PERF WALL MOTION  02/2012   lexiscan myoview; mild perfusion defect in mid inferolateral & basal inferolateral region (infarct/scar); EF 52%, abnormal but ow risk scan   PERCUTANEOUS CORONARY STENT INTERVENTION (PCI-S)  09/05/2014   Procedure: PERCUTANEOUS CORONARY STENT INTERVENTION (PCI-S);  Surgeon: ChrBurnell BlanksD;  [facility];  Service: Cardiovascular;;   TONSILLECTOMY      Family History  Problem Relation Age of Onset   Leukemia Mother    Prostate cancer Father  Cancer Brother    Coronary artery disease Paternal Grandmother    Cancer Paternal Grandfather    Migraines Neg Hx    Headache Neg Hx     Social History   Socioeconomic History   Marital status: Unknown    Spouse name: Not on file   Number of children: 2   Years of education: GED   Highest education level: Not on file  Occupational History   Not on file  Tobacco Use   Smoking status: Former    Packs/day: 1.00    Years: 10.00    Pack years: 10.00    Types: Cigarettes    Quit date: 10/07/2018    Years since quitting: 2.5   Smokeless tobacco: Never  Vaping Use   Vaping Use: Never used  Substance and Sexual Activity   Alcohol use: Not Currently    Comment: 2 bottles of wine on 12/08/2019 (one occurrence)   Drug use: Not Currently    Types: Cocaine    Comment: last use several months ago   Sexual activity: Not Currently  Other  Topics Concern   Not on file  Social History Narrative   ** Merged History Encounter **       Social Determinants of Health   Financial Resource Strain: Not on file  Food Insecurity: Not on file  Transportation Needs: Not on file  Physical Activity: Not on file  Stress: Not on file  Social Connections: Not on file  Intimate Partner Violence: Not on file    Outpatient Medications Prior to Visit  Medication Sig Dispense Refill   Accu-Chek Softclix Lancets lancets SMARTSIG:1 Topical 4 Times Daily     amLODipine (NORVASC) 10 MG tablet Take 1 tablet (10 mg total) by mouth daily. 90 tablet 3   aspirin EC 81 MG EC tablet Take 1 tablet (81 mg total) by mouth daily. (May buy from over the counter): Swallow whole for heart health 30 tablet 0   blood glucose meter kit and supplies KIT Dispense based on patient and insurance preference. Use up to four times daily as directed. (FOR ICD-9 250.00, 250.01). 1 each 0   Blood Pressure Monitor KIT 1 kit by Does not apply route daily. 1 kit 0   clopidogrel (PLAVIX) 75 MG tablet Take 1 tablet (75 mg total) by mouth daily. 90 tablet 1   EPINEPHrine 0.3 mg/0.3 mL IJ SOAJ injection Inject 0.3 mg into the muscle as needed for anaphylaxis. 1 each 2   LIVALO 4 MG TABS Take 1 tablet (4 mg total) by mouth daily. 90 tablet 3   metFORMIN (GLUCOPHAGE) 500 MG tablet TAKE 1 TABLET BY MOUTH TWICE DAILY WITH A MEAL 180 tablet 2   metoprolol tartrate (LOPRESSOR) 100 MG tablet Take 0.5 tablets (50 mg total) by mouth 2 (two) times daily. 180 tablet 3   nitroGLYCERIN (NITROSTAT) 0.4 MG SL tablet Place 1 tablet (0.4 mg total) under the tongue every 5 (five) minutes x 3 doses as needed for chest pain. 25 tablet 3   SF 5000 PLUS 1.1 % CREA dental cream See admin instructions.     glucose blood (ACCU-CHEK GUIDE) test strip Use as instructed 100 each 12   sildenafil (VIAGRA) 100 MG tablet Take 50-100 mg by mouth daily as needed.     Testosterone (ANDROGEL PUMP) 20.25 MG/ACT  (1.62%) GEL Place 1 Pump onto the skin daily. 75 g 2   No facility-administered medications prior to visit.    Allergies  Allergen Reactions  Bee Venom Anaphylaxis and Hives   Gadolinium Itching and Swelling    Swelling of eyes after receiving MR contrast. 13 hr prep recommended.    Gadolinium Derivatives Hives    Pt developed large hive and swelling under right eye after contrast, given 66m iv benadryl, pt will need premed before gadolinium//lh   Shellfish Allergy Anaphylaxis and Hives   Statins Other (See Comments)    Myalgias. Tolerating livalo. Pain    Statins    Testosterone Cypionate     Testerone Injection --Increased breast tissue     ROS Review of Systems    Objective:    Physical Exam Constitutional:      General: He is not in acute distress.    Appearance: He is normal weight. He is not ill-appearing, toxic-appearing or diaphoretic.  HENT:     Head: Normocephalic and atraumatic.     Right Ear: Tympanic membrane normal.     Left Ear: Tympanic membrane normal.     Nose: Nose normal.     Mouth/Throat:     Mouth: Mucous membranes are moist.  Eyes:     Pupils: Pupils are equal, round, and reactive to light.  Cardiovascular:     Rate and Rhythm: Normal rate and regular rhythm.     Pulses: Normal pulses.     Heart sounds: Normal heart sounds.  Pulmonary:     Effort: Pulmonary effort is normal.     Breath sounds: Normal breath sounds.  Abdominal:     General: Bowel sounds are normal.     Palpations: Abdomen is soft.  Genitourinary:    Prostate: Normal.     Rectum: Guaiac result negative.  Musculoskeletal:        General: Normal range of motion.     Cervical back: Normal range of motion.     Right lower leg: No edema.     Left lower leg: No edema.  Feet:     Right foot:     Protective Sensation: 10 sites tested.  10 sites sensed.     Left foot:     Protective Sensation: 10 sites tested.  10 sites sensed.     Comments: Well healed surgical scar to  right foot.  Skin:    General: Skin is warm and dry.     Capillary Refill: Capillary refill takes less than 2 seconds.  Neurological:     General: No focal deficit present.     Mental Status: He is alert and oriented to person, place, and time.  Psychiatric:        Mood and Affect: Mood normal.        Behavior: Behavior normal.        Thought Content: Thought content normal.        Judgment: Judgment normal.    BP 129/89   Pulse 60   Temp (!) 97.2 F (36.2 C)   Ht 5' 8"  (1.727 m)   Wt 193 lb 9.6 oz (87.8 kg)   SpO2 98%   BMI 29.44 kg/m  Wt Readings from Last 3 Encounters:  04/24/21 193 lb 9.6 oz (87.8 kg)  04/01/21 193 lb 9.6 oz (87.8 kg)  03/11/21 189 lb 3.2 oz (85.8 kg)     Health Maintenance Due  Topic Date Due   COVID-19 Vaccine (1) Never done    There are no preventive care reminders to display for this patient.  Lab Results  Component Value Date   TSH 1.089 03/19/2020   Lab Results  Component Value Date   WBC 5.9 10/31/2020   HGB 14.5 10/31/2020   HCT 44.2 10/31/2020   MCV 85 10/31/2020   PLT 246 10/31/2020   Lab Results  Component Value Date   NA 142 03/05/2021   K 4.8 03/05/2021   CO2 22 03/24/2020   GLUCOSE 103 (H) 03/05/2021   BUN 13 03/05/2021   CREATININE 0.92 03/05/2021   BILITOT 0.4 03/05/2021   ALKPHOS 64 03/05/2021   AST 24 03/05/2021   ALT 31 03/17/2020   PROT 7.2 03/05/2021   ALBUMIN 5.0 (H) 03/05/2021   CALCIUM 9.7 03/05/2021   ANIONGAP 12 03/24/2020   EGFR 96 03/05/2021   Lab Results  Component Value Date   CHOL 152 03/05/2021   Lab Results  Component Value Date   HDL 44 03/05/2021   Lab Results  Component Value Date   LDLCALC 90 03/05/2021   Lab Results  Component Value Date   TRIG 99 03/05/2021   Lab Results  Component Value Date   CHOLHDL 3.5 03/05/2021   Lab Results  Component Value Date   HGBA1C 5.0 03/05/2021   HGBA1C 5.0 03/05/2021   HGBA1C 5.0 (A) 03/05/2021   HGBA1C 5.0 03/05/2021       Assessment & Plan:   Problem List Items Addressed This Visit       Cardiovascular and Mediastinum   Essential hypertension (Chronic) Encouraged on going compliance with current medication regimen Encouraged home monitoring and recording BP <130/80 Eating a heart-healthy diet with less salt Encouraged regular physical activity  Recommend Weight loss     Relevant Medications   sildenafil (VIAGRA) 100 MG tablet     Endocrine   DM2 (diabetes mellitus, type 2) ([name]) - Primary (Chronic) Controlled A1c 6.0% 2 months ago Encourage compliance with current treatment regimen .  Discussed possible changing metformin to sulfonylurea to see if this may make a difference with fatigue. Encourage regular CBG monitoring Encourage contacting office if excessive hyperglycemia and or hypoglycemia Lifestyle modification with healthy diet (fewer calories, more high fiber foods, whole grains and non-starchy vegetables, lower fat meat and fish, low-fat diary include healthy oils) regular exercise (physical activity) and weight loss Opthalmology exam discussed  Nutritional consult recommended Regular dental visits encouraged Home BP monitoring also encouraged goal <130/80    Other Visit Diagnoses     Fatigue, unspecified type     Persistent with no improvement Education provided   Hypogonadism male     Persistent no notable change Labs pending   Relevant Orders   Testosterone       Meds ordered this encounter  Medications   glucose blood (ACCU-CHEK GUIDE) test strip    Sig: Use as instructed    Dispense:  300 each    Refill:  3    Order Specific Question:   Supervising Provider    Answer:   Tresa Garter [[id number]]   sildenafil (VIAGRA) 100 MG tablet    Sig: Take 1 tablet (100 mg total) by mouth daily as needed.    Dispense:  10 tablet    Refill:  5    Order Specific Question:   Supervising Provider    Answer:   Tresa Garter [id number]   Testosterone (ANDROGEL PUMP)  20.25 MG/ACT (1.62%) GEL    Sig: Place 1 Pump onto the skin daily.    Dispense:  75 g    Refill:  2    Order Specific Question:   Supervising Provider    Answer:  Tresa Garter [[id number]]     Follow-up: Return in about 3 months (around 07/25/2021).    Vevelyn Francois, NP

## 2021-04-25 LAB — TESTOSTERONE: Testosterone: 327 ng/dL (ref 264–916)

## 2021-05-02 ENCOUNTER — Other Ambulatory Visit: Payer: Self-pay

## 2021-05-02 ENCOUNTER — Encounter: Payer: Self-pay | Admitting: Podiatry

## 2021-05-02 ENCOUNTER — Ambulatory Visit: Payer: Medicaid Other | Admitting: Podiatry

## 2021-05-02 DIAGNOSIS — M722 Plantar fascial fibromatosis: Secondary | ICD-10-CM

## 2021-05-02 MED ORDER — TRIAMCINOLONE ACETONIDE 10 MG/ML IJ SUSP
20.0000 mg | Freq: Once | INTRAMUSCULAR | Status: AC
  Administered 2021-05-02: 20 mg

## 2021-05-05 NOTE — Progress Notes (Signed)
FOLLOW UP Date of Service/Encounter:  05/07/21   Subjective:  [name] (DOB: [birth year]) is a 60 y.o. male who returns to the [facility] [place] on 05/07/2021 in re-evaluation of the following: allergic rhinitis, shellfish allergy and history of bee sting reaction History obtained from: chart review and patient.  For Review, LV was on 09/09/20  with Dr. Maudie Mercury seen for perennial allergic rhinitis (tx: AH (zyrtec), flonase, saline spray, consider allergy shots) and shellfish allergy (history of facial swelling and rash, positive testing) and history of bee sting allergy (declined blood work, carries epipen).    Previous diagnostics (09/09/20):  Positive to grass, weed pollen, dust mites, cat, cockroach, ragweed, tree pollen. Borderline to dog.   Positive to shellfish and mollusks   Today he presents for follow-up.  He is having a flare of his allergic rhinitis. He did get an air purifier for 2 rooms in his home and this seemed to help with his allergic rhinitis symptoms.  However, recently removed these because of the cold air.  He thought seasonal allergies were over and this would not be an issue.   He was told that it may be his carpet (59 years old).   It looks good, but it is old.  He does vacuum. He would like to have it replaced, but is renting. He did speak with management and he was told if he got a medical statement, they would change the carpet. He is taking zyrtec daily as needed.  He has no accidental exposures to shellfish since his last visit. No bee stings either since last visit.  Allergies as of 05/07/2021       Reactions   Bee Venom Anaphylaxis, Hives   Gadolinium Itching, Swelling   Swelling of eyes after receiving MR contrast. 13 hr prep recommended.    Gadolinium Derivatives Hives   Pt developed large hive and swelling under right eye after contrast, given 51m iv benadryl, pt will need premed before gadolinium//lh   Shellfish Allergy Anaphylaxis, Hives    Statins Other (See Comments)   Myalgias. Tolerating livalo. Pain   Statins    Testosterone Cypionate    Testerone Injection --Increased breast tissue         Medication List        Accurate as of May 07, 2021 12:54 PM. If you have any questions, ask your nurse or doctor.          Accu-Chek Guide test strip Generic drug: glucose blood Use as instructed   Accu-Chek Softclix Lancets lancets SMARTSIG:1 Topical 4 Times Daily   amLODipine 10 MG tablet Commonly known as: NORVASC Take 1 tablet (10 mg total) by mouth daily.   aspirin 81 MG EC tablet Take 1 tablet (81 mg total) by mouth daily. (May buy from over the counter): Swallow whole for heart health   blood glucose meter kit and supplies Kit Dispense based on patient and insurance preference. Use up to four times daily as directed. (FOR ICD-9 250.00, 250.01).   Blood Pressure Monitor Kit 1 kit by Does not apply route daily.   clopidogrel 75 MG tablet Commonly known as: PLAVIX Take 1 tablet (75 mg total) by mouth daily.   EPINEPHrine 0.3 mg/0.3 mL Soaj injection Commonly known as: EPI-PEN Inject 0.3 mg into the muscle as needed for anaphylaxis.   Livalo 4 MG Tabs Generic drug: Pitavastatin Calcium Take 1 tablet (4 mg total) by mouth daily.   metFORMIN 500 MG tablet Commonly known as: GLUCOPHAGE  TAKE 1 TABLET BY MOUTH TWICE DAILY WITH A MEAL   metoprolol tartrate 100 MG tablet Commonly known as: LOPRESSOR Take 0.5 tablets (50 mg total) by mouth 2 (two) times daily.   nitroGLYCERIN 0.4 MG SL tablet Commonly known as: NITROSTAT Place 1 tablet (0.4 mg total) under the tongue every 5 (five) minutes x 3 doses as needed for chest pain.   SF 5000 Plus 1.1 % Crea dental cream Generic drug: sodium fluoride See admin instructions.   sildenafil 100 MG tablet Commonly known as: VIAGRA Take 1 tablet (100 mg total) by mouth daily as needed.   Testosterone 20.25 MG/ACT (1.62%) Gel Commonly known as: AndroGel  Pump Place 1 Pump onto the skin daily.       Past Medical History:  Diagnosis Date   Alcohol dependence ([name])    Allergies    Arthritis    Chest pain    Chronic lower back pain    Chronic pain of right wrist    Coronary artery disease    a. Multiple prior caths/PCI. Cath 2013 with possible spasm of RCA, 70% ISR of mid LCx with subsequent DES to mLCx and prox LCX. b. H/o microvascular angina. c. Recurrent angina 08/2014 - s/p PTCA/DES to prox Cx, PTCA/CBA to OM1.  c. LHC 06/10/15 with patent stents and some ISR in LCX and OM-1 that was not flow limiting --> Rx    Dyslipidemia    a. Intolerant to many statins except tolerating Livalo.   GERD (gastroesophageal reflux disease)    H/O cardiac catheterization 10/25/2018   Heart attack [facility])    Hypertension    Myocardial infarction [facility]) ~ 2010   S/P angioplasty with stent, DES, to proximal and mid LCX 12/15/11 12/15/2011   S/P foot surgery, right 04/2021   Shoulder pain    Stroke ([place])    pt. reports had a stroke around time of MI 2010   Type II diabetes mellitus ([place])    Unstable angina [facility])    Past Surgical History:  Procedure Laterality Date   CARDIAC CATHETERIZATION  06/15/2002   LAD with prox 40% stenosis, norma L main, Cfx with 25% lesion, RCA with long mid 25% stenosis (Dr. Vita Barley)   CARDIAC CATHETERIZATION  04/01/2010   normal L main, LAD wit mild stenosis, L Cfx with 70% in-stent restenosis, RCA with 70% in-stent restenosis, LVEF >60% (Dr. K. Mali Hilty) - cutting ballon arthrectomy to RCA & Cfx (Dr. Rockne Menghini)   CARDIAC CATHETERIZATION  08/25/2010   preserved global LV contractility; multivessel CAD, diffuse 90-95% in-stent restenosis in prox placed Cfx stent - cutting balloon arthrectomy in Cfx with multiple dilatations 90-95% to 0% stenosis (Dr. Corky Downs)   CARDIAC CATHETERIZATION  01/26/2011   PCI & stenting of aggresive in-stent restenosis within previously stented AV groove Cfx with 3.0x79m Taxus DES (previous stents  were Promus) (Dr. JAdora Fridge   CARDIAC CATHETERIZATION  05/11/2011   preserved LV function, 40% mid LAD stenosis, 30-40% narrowing proximal to stented semgnet of prox Cfx, patent mid RCA stent with smooth 20% narrowing in distal RCA (Dr. TCorky Downs   CARDIAC CATHETERIZATION  12/15/2011   PCI & stenting of proximal & mid Cfx with DES - 3.0x153min proximal, 3.0x1585mn mid (Dr. J. Adora Fridge [facility] 06/10/2015   Procedure: Left Heart Cath and Coronary Angiography;  Surgeon: DanJolaine ArtistD;  Location: [facility];  Service: Cardiovascular;  Laterality: N/A;   CARDIAC CATHETERIZATION  10/25/2018  cardiometabolic testing  7/68/1157   good exercise effort, peak VO2 79% predicted with normal VO2 HR curves (mild deconditioning)   COLONOSCOPY  12/2012   diminutive hyperplastic sigmoid poyp so repeat routine 2024   CORONARY BALLOON ANGIOPLASTY N/A 10/25/2018   Procedure: CORONARY BALLOON ANGIOPLASTY;  Surgeon: Jettie Booze, MD;  Location: [place] [facility];  Service: Cardiovascular;  Laterality: N/A;   CORONARY BALLOON ANGIOPLASTY N/A 09/29/2019   Procedure: CORONARY BALLOON ANGIOPLASTY;  Surgeon: Nelva Bush, MD;  Location: [place] [facility];  Service: Cardiovascular;  Laterality: N/A;   EXCISIONAL HEMORRHOIDECTOMY  1984   INTRAVASCULAR ULTRASOUND/IVUS N/A 09/29/2019   Procedure: Intravascular Ultrasound/IVUS;  Surgeon: Nelva Bush, MD;  Location: [place] [facility];  Service: Cardiovascular;  Laterality: N/A;   LEFT HEART CATH AND CORONARY ANGIOGRAPHY N/A 10/25/2018   Procedure: LEFT HEART CATH AND CORONARY ANGIOGRAPHY;  Surgeon: Jettie Booze, MD;  Location: [place] [facility];  Service: Cardiovascular;  Laterality: N/A;   LEFT HEART CATH AND CORONARY ANGIOGRAPHY N/A 09/29/2019   Procedure: LEFT HEART CATH AND CORONARY ANGIOGRAPHY;  Surgeon: Nelva Bush, MD;  Location: [place] [facility];  Service: Cardiovascular;  Laterality: N/A;   LEFT HEART  CATHETERIZATION WITH CORONARY ANGIOGRAM N/A 05/11/2011   Procedure: LEFT HEART CATHETERIZATION WITH CORONARY ANGIOGRAM;  Surgeon: Troy Sine, MD;  Location: [facility] CATH LAB;  Service: Cardiovascular;  Laterality: N/A;  Possible percutaneous coronary intervention, possible IVUS   LEFT HEART CATHETERIZATION WITH CORONARY ANGIOGRAM N/A 12/15/2011   Procedure: LEFT HEART CATHETERIZATION WITH CORONARY ANGIOGRAM;  Surgeon: Lorretta Harp, MD;  Location: [facility] CATH LAB;  Service: Cardiovascular;  Laterality: N/A;   LEFT HEART CATHETERIZATION WITH CORONARY ANGIOGRAM N/A 09/05/2014   Procedure: LEFT HEART CATHETERIZATION WITH CORONARY ANGIOGRAM;  Surgeon: Burnell Blanks, MD;  Location: [facility] CATH LAB;  Service: Cardiovascular;  Laterality: N/A;   LIPOMA EXCISION     back of the head   NM MYOCAR PERF WALL MOTION  02/2012   lexiscan myoview; mild perfusion defect in mid inferolateral & basal inferolateral region (infarct/scar); EF 52%, abnormal but ow risk scan   PERCUTANEOUS CORONARY STENT INTERVENTION (PCI-S)  09/05/2014   Procedure: PERCUTANEOUS CORONARY STENT INTERVENTION (PCI-S);  Surgeon: Burnell Blanks, MD;  Location: [facility] CATH LAB;  Service: Cardiovascular;;   TONSILLECTOMY     Otherwise, there have been no changes to his past medical history, surgical history, family history, or social history.  ROS: All others negative except as noted per HPI.   Objective:  BP 118/78 (BP Location: Left Arm, Patient Position: Sitting, Cuff Size: Normal)   Pulse (!) 57   Temp (!) 97.3 F (36.3 C) (Temporal)   Resp 14   SpO2 99%  There is no height or weight on file to calculate BMI. Physical Exam: General Appearance:  Alert, cooperative, no distress, appears stated age  Head:  Normocephalic, without obvious abnormality, atraumatic  Eyes:  Conjunctiva clear, EOM's intact  Nose: Nares normal, hypertrophic turbinates, no visible anterior polyps  Throat: Lips, tongue normal; teeth and gums normal, normal  posterior oropharynx  Neck: Supple, symmetrical  Lungs:   CTAB, Respirations unlabored, no coughing  Heart:  RRR, no murmur, Appears well perfused  Extremities: No edema  Skin: Skin color, texture, turgor normal, no rashes or lesions on visualized portions of skin  Neurologic: No gross deficits   Assessment/Plan   Patient Instructions  Your previous testing was positive to:  -grass, weed pollen, dust mites, cat, cockroach, ragweed, tree pollen.  Borderline to dog.   -shellfish and mollusks.  Environmental allergies-uncontrolled Continue environmental control measures as below (including consideration of carpet removal)-letter provided for consideration of removal May use over the counter antihistamines such as Zyrtec (cetirizine), Claritin (loratadine), Allegra (fexofenadine), or Xyzal (levocetirizine) daily as needed. May use Flonase (fluticasone) nasal spray 1 spray per nostril twice a day as needed for nasal congestion.  Nasal saline spray (i.e., Simply Saline) or nasal saline lavage (i.e., NeilMed) is recommended as needed and prior to medicated nasal sprays. Read about allergy injections - handout given.   Food allergies-stable Continue strict avoidance of shellfish and mollusks.  For mild symptoms you can take over the counter antihistamines such as Benadryl and monitor symptoms closely. If symptoms worsen or if you have severe symptoms including breathing issues, throat closure, significant swelling, whole body hives, severe diarrhea and vomiting, lightheadedness then inject epinephrine and seek immediate medical care afterwards. Epipen prescription updated Food action plan given.  Bee stings allergy-stable Avoid bee stings. Continue to carry epinephrine autoinjector in case of accidental sting and severe reaction.  Follow up in 12 months or sooner if needed.   Sigurd Sos, MD  [facility] [place] [facility] [place]

## 2021-05-05 NOTE — Progress Notes (Signed)
Subjective:   Patient ID: [name], male   DOB: 60 y.o.   MRN: [id number]   HPI Patient states recently has started develop a reoccurrence of discomfort in his plantar heel of both feet stating its increasingly hard for him to be active neuro   ROS      Objective:  Physical Exam  Neurovascular status intact with exquisite discomfort plantar heel region bilateral     Assessment:  Acute Planter fasciitis bilateral heels     Plan:  Reviewed at 1 point surgical intervention may be necessary but would continue on a try to hold off on this as long as possible and discussed shoe gear modification stretching and I did do sterile prep and injected the plantar fascial bilateral 3 mg Kenalog 5 mg Xylocaine

## 2021-05-07 ENCOUNTER — Encounter: Payer: Self-pay | Admitting: Internal Medicine

## 2021-05-07 ENCOUNTER — Ambulatory Visit (INDEPENDENT_AMBULATORY_CARE_PROVIDER_SITE_OTHER): Payer: Medicaid Other | Admitting: Internal Medicine

## 2021-05-07 ENCOUNTER — Other Ambulatory Visit: Payer: Self-pay | Admitting: *Deleted

## 2021-05-07 ENCOUNTER — Other Ambulatory Visit: Payer: Self-pay

## 2021-05-07 VITALS — BP 118/78 | HR 57 | Temp 97.3°F | Resp 14

## 2021-05-07 DIAGNOSIS — T781XXD Other adverse food reactions, not elsewhere classified, subsequent encounter: Secondary | ICD-10-CM

## 2021-05-07 DIAGNOSIS — Z91030 Bee allergy status: Secondary | ICD-10-CM | POA: Diagnosis not present

## 2021-05-07 DIAGNOSIS — J3089 Other allergic rhinitis: Secondary | ICD-10-CM

## 2021-05-07 MED ORDER — EPINEPHRINE 0.3 MG/0.3ML IJ SOAJ: 0.3000 mg | INTRAMUSCULAR | 2 refills | Status: AC | PRN

## 2021-05-07 MED ORDER — EPIPEN 2-PAK 0.3 MG/0.3ML IJ SOAJ: 0.3000 mg | Freq: Once | INTRAMUSCULAR | 1 refills | Status: AC

## 2021-05-07 NOTE — Patient Instructions (Signed)
Your previous testing was positive to:  -grass, weed pollen, dust mites, cat, cockroach, ragweed, tree pollen. Borderline to dog.   -shellfish and mollusks.  Environmental allergies-uncontrolled Continue environmental control measures as below (including consideration of carpet removal) May use over the counter antihistamines such as Zyrtec (cetirizine), Claritin (loratadine), Allegra (fexofenadine), or Xyzal (levocetirizine) daily as needed. May use Flonase (fluticasone) nasal spray 1 spray per nostril twice a day as needed for nasal congestion.  Nasal saline spray (i.e., Simply Saline) or nasal saline lavage (i.e., NeilMed) is recommended as needed and prior to medicated nasal sprays. Read about allergy injections - handout given.   Food allergies-stable Continue strict avoidance of shellfish and mollusks.  For mild symptoms you can take over the counter antihistamines such as Benadryl and monitor symptoms closely. If symptoms worsen or if you have severe symptoms including breathing issues, throat closure, significant swelling, whole body hives, severe diarrhea and vomiting, lightheadedness then inject epinephrine and seek immediate medical care afterwards. Epipen prescription updated Food action plan given.  Bee stings: Avoid bee stings.  Follow up in 12 months or sooner if needed.  -------------------------------------------------------------------------------------------------------------------------- Reducing Pollen Exposure Pollen seasons: trees (spring), grass (summer) and ragweed/weeds (fall). Keep windows closed in your home and car to lower pollen exposure.  Install air conditioning in the bedroom and throughout the house if possible.  Avoid going out in dry windy days - especially early morning. Pollen counts are highest between 5 - 10 AM and on dry, hot and windy days.  Save outside activities for late afternoon or after a heavy rain, when pollen levels are lower.  Avoid  mowing of grass if you have grass pollen allergy. Be aware that pollen can also be transported indoors on people and pets.  Dry your clothes in an automatic dryer rather than hanging them outside where they might collect pollen.  Rinse hair and eyes before bedtime. Control of House Dust Mite Allergen Dust mite allergens are a common trigger of allergy and asthma symptoms. While they can be found throughout the house, these microscopic creatures thrive in warm, humid environments such as bedding, upholstered furniture and carpeting. Because so much time is spent in the bedroom, it is essential to reduce mite levels there.  Encase pillows, mattresses, and box springs in special allergen-proof fabric covers or airtight, zippered plastic covers.  Bedding should be washed weekly in hot water (130 F) and dried in a hot dryer. Allergen-proof covers are available for comforters and pillows that can't be regularly washed.  Wash the allergy-proof covers every few months. Minimize clutter in the bedroom. Keep pets out of the bedroom.  Keep humidity less than 50% by using a dehumidifier or air conditioning. You can buy a humidity measuring device called a hygrometer to monitor this.  If possible, replace carpets with hardwood, linoleum, or washable area rugs. If that's not possible, vacuum frequently with a vacuum that has a HEPA filter. Remove all upholstered furniture and non-washable window drapes from the bedroom. Remove all non-washable stuffed toys from the bedroom.  Wash stuffed toys weekly. Cockroach Allergen Avoidance Cockroaches are often found in the homes of densely populated urban areas, schools or commercial buildings, but these creatures can lurk almost anywhere. This does not mean that you have a dirty house or living area. Block all areas where roaches can enter the home. This includes crevices, wall cracks and windows.  Cockroaches need water to survive, so fix and seal all leaky faucets and  pipes. Have an exterminator  go through the house when your family and pets are gone to eliminate any remaining roaches. Keep food in lidded containers and put pet food dishes away after your pets are done eating. Vacuum and sweep the floor after meals, and take out garbage and recyclables. Use lidded garbage containers in the kitchen. Wash dishes immediately after use and clean under stoves, refrigerators or toasters where crumbs can accumulate. Wipe off the stove and other kitchen surfaces and cupboards regularly. Pet Allergen Avoidance: Contrary to popular opinion, there are no "hypoallergenic" breeds of dogs or cats. That is because people are not allergic to an animal's hair, but to an allergen found in the animal's saliva, dander (dead skin flakes) or urine. Pet allergy symptoms typically occur within minutes. For some people, symptoms can build up and become most severe 8 to 12 hours after contact with the animal. People with severe allergies can experience reactions in public places if dander has been transported on the pet owners' clothing. Keeping an animal outdoors is only a partial solution, since homes with pets in the yard still have higher concentrations of animal allergens. Before getting a pet, ask your allergist to determine if you are allergic to animals. If your pet is already considered part of your family, try to minimize contact and keep the pet out of the bedroom and other rooms where you spend a great deal of time. As with dust mites, vacuum carpets often or replace carpet with a hardwood floor, tile or linoleum. High-efficiency particulate air (HEPA) cleaners can reduce allergen levels over time. While dander and saliva are the source of cat and dog allergens, urine is the source of allergens from rabbits, hamsters, mice and [place] pigs; so ask a non-allergic family member to clean the animal's cage. If you have a pet allergy, talk to your allergist about the potential for allergy  immunotherapy (allergy shots). This strategy can often provide long-term relief.

## 2021-05-13 ENCOUNTER — Other Ambulatory Visit: Payer: Self-pay | Admitting: *Deleted

## 2021-05-13 MED ORDER — EPIPEN 2-PAK 0.3 MG/0.3ML IJ SOAJ: 0.3000 mg | Freq: Once | INTRAMUSCULAR | 1 refills | Status: AC

## 2021-05-29 ENCOUNTER — Other Ambulatory Visit: Payer: Self-pay | Admitting: Internal Medicine

## 2021-05-30 ENCOUNTER — Other Ambulatory Visit: Payer: Self-pay

## 2021-06-11 ENCOUNTER — Telehealth: Payer: Self-pay | Admitting: Internal Medicine

## 2021-06-11 NOTE — Telephone Encounter (Signed)
Left a message for patient to call the office to speak with him regarding Accommodation/Modification forms. Dr. Simona Huh filled out forms on Friday with Address, e-mail and telephone number however patient stated that they are not correct. Dr. Simona Huh filled them out again this morning and doesn't recall if she put the e-mail, address or telephone number on them. Spoke with Morey Hummingbird and she scanned forms and emailed them to me. Forms ask "Is this resident/applicant disabled?" It states that The [employer] defines a person with a disability as (1) individuals with a physical or mental impairment that substantially limits one or more major life activities, (2) individuals who are regarded as having such impairment; and (3) individuals with a record of such an impairment in which Dr. Simona Huh checked the I don't know line. Another question asks "is the reasonable accommodation/modification being requested directly related to the resident's/applicant's disability and its restrictions on their daily life in which Dr. Simona Huh put I don't know. After speak with Dr. Simona Huh she stated that she isn't saying he doesn't have a disability however she does not treat him for his disability and his disability has nothing to do with his allergies. Dr. Simona Huh stated that she is happy to speak with someone regarding the forms but will not state that the patient is disabled when she doesn't treat him for any disability.

## 2021-06-11 NOTE — Telephone Encounter (Signed)
Patient called stating he pick up some forms today. Patient states forms were filled out incorrectly. Address, phone number and email were not filled out and forms need that information. Patient also states form had question asking if patient was disabled, form was marked with unknown for that question. Patient states he is disabled and is on medicaid. Patient asked if Dr. Simona Huh was going to be in the office tomorrow, I let him know she would be in our [facility] office. Patient stated he would not be able to go to the [facility] office and would come back next week.

## 2021-06-11 NOTE — Telephone Encounter (Signed)
Thank you-happy to write that on the form too if helpful.  I also checked the box that the accommodations requested would likely benefit his health.  Unfortunately, allergic rhinitis doesn't qualify for disability (which is what I treat him for). Happy to help in any way possible

## 2021-06-12 NOTE — Telephone Encounter (Signed)
Patient came in this morning and was informed of the note below. I informed patient that the forms are not saying his doesn't have a disability, just that since Dr. Simona Huh doesn't see him for a disability that she can not put yes to the below questions. I did inform patient of Dr. Simona Huh note to write a letter and help any way she can. Patient verbalized understanding and was very appreciative.

## 2021-06-12 NOTE — Telephone Encounter (Signed)
Thank you [name]!

## 2021-06-18 ENCOUNTER — Ambulatory Visit (HOSPITAL_COMMUNITY): Payer: Self-pay

## 2021-07-07 ENCOUNTER — Other Ambulatory Visit: Payer: Self-pay

## 2021-07-07 ENCOUNTER — Other Ambulatory Visit (HOSPITAL_COMMUNITY)
Admission: EM | Admit: 2021-07-07 | Discharge: 2021-07-10 | Disposition: A | Discharge status: Home / Self Care | Payer: Medicaid Other | Attending: Psychiatry | Admitting: Psychiatry

## 2021-07-07 ENCOUNTER — Ambulatory Visit (INDEPENDENT_AMBULATORY_CARE_PROVIDER_SITE_OTHER): Payer: Medicaid Other | Admitting: Psychiatry

## 2021-07-07 ENCOUNTER — Encounter (HOSPITAL_COMMUNITY): Payer: Self-pay | Admitting: Psychiatry

## 2021-07-07 DIAGNOSIS — F332 Major depressive disorder, recurrent severe without psychotic features: Secondary | ICD-10-CM | POA: Insufficient documentation

## 2021-07-07 DIAGNOSIS — Z79899 Other long term (current) drug therapy: Secondary | ICD-10-CM | POA: Insufficient documentation

## 2021-07-07 DIAGNOSIS — F411 Generalized anxiety disorder: Secondary | ICD-10-CM | POA: Insufficient documentation

## 2021-07-07 DIAGNOSIS — Z20822 Contact with and (suspected) exposure to covid-19: Secondary | ICD-10-CM | POA: Diagnosis not present

## 2021-07-07 DIAGNOSIS — R45851 Suicidal ideations: Secondary | ICD-10-CM | POA: Insufficient documentation

## 2021-07-07 DIAGNOSIS — Z6372 Alcoholism and drug addiction in family: Secondary | ICD-10-CM | POA: Insufficient documentation

## 2021-07-07 DIAGNOSIS — Z818 Family history of other mental and behavioral disorders: Secondary | ICD-10-CM | POA: Insufficient documentation

## 2021-07-07 LAB — LIPID PANEL
Cholesterol: 144 mg/dL (ref 0–200)
HDL: 44 mg/dL (ref >40)
LDL Cholesterol: 91 mg/dL (ref 0–99)
Total CHOL/HDL Ratio: 3.3 RATIO
Triglycerides: 45 mg/dL (ref <150)
VLDL: 9 mg/dL (ref 0–40)

## 2021-07-07 LAB — CBC WITH DIFFERENTIAL/PLATELET
Abs Immature Granulocytes: 0.03 10*3/uL (ref 0.00–0.07)
Basophils Absolute: 0.1 10*3/uL (ref 0.0–0.1)
Basophils Relative: 1 %
Eosinophils Absolute: 0.2 10*3/uL (ref 0.0–0.5)
Eosinophils Relative: 5 %
HCT: 41.7 % (ref 39.0–52.0)
Hemoglobin: 13.9 g/dL (ref 13.0–17.0)
Immature Granulocytes: 1 %
Lymphocytes Relative: 38 %
Lymphs Abs: 1.9 10*3/uL (ref 0.7–4.0)
MCH: 27.8 pg (ref 26.0–34.0)
MCHC: 33.3 g/dL (ref 30.0–36.0)
MCV: 83.4 fL (ref 80.0–100.0)
Monocytes Absolute: 0.4 10*3/uL (ref 0.1–1.0)
Monocytes Relative: 9 %
Neutro Abs: 2.3 10*3/uL (ref 1.7–7.7)
Neutrophils Relative %: 46 %
Platelets: 229 10*3/uL (ref 150–400)
RBC: 5 MIL/uL (ref 4.22–5.81)
RDW: 12.3 % (ref 11.5–15.5)
WBC: 4.8 10*3/uL (ref 4.0–10.5)
nRBC: 0 % (ref 0.0–0.2)

## 2021-07-07 LAB — COMPREHENSIVE METABOLIC PANEL
ALT: 41 U/L (ref 0–44)
AST: 24 U/L (ref 15–41)
Albumin: 4.3 g/dL (ref 3.5–5.0)
Alkaline Phosphatase: 51 U/L (ref 38–126)
Anion gap: 8 (ref 5–15)
BUN: 15 mg/dL (ref 6–20)
CO2: 24 mmol/L (ref 22–32)
Calcium: 9.2 mg/dL (ref 8.9–10.3)
Chloride: 104 mmol/L (ref 98–111)
Creatinine, Ser: 0.92 mg/dL (ref 0.61–1.24)
GFR, Estimated: >60 mL/min (ref >60)
Glucose, Bld: 144 mg/dL — ABNORMAL HIGH (ref 70–99)
Potassium: 4.3 mmol/L (ref 3.5–5.1)
Sodium: 136 mmol/L (ref 135–145)
Total Bilirubin: 0.7 mg/dL (ref 0.3–1.2)
Total Protein: 6.6 g/dL (ref 6.5–8.1)

## 2021-07-07 LAB — POCT URINE DRUG SCREEN - MANUAL ENTRY (I-SCREEN)
POC Amphetamine UR: NOT DETECTED
POC Buprenorphine (BUP): NOT DETECTED
POC Cocaine UR: NOT DETECTED
POC Marijuana UR: NOT DETECTED
POC Methadone UR: NOT DETECTED
POC Methamphetamine UR: NOT DETECTED
POC Morphine: NOT DETECTED
POC Oxazepam (BZO): NOT DETECTED
POC Oxycodone UR: NOT DETECTED
POC Secobarbital (BAR): NOT DETECTED

## 2021-07-07 LAB — RESP PANEL BY RT-PCR (FLU A&B, COVID) ARPGX2
Influenza A by PCR: NEGATIVE
Influenza B by PCR: NEGATIVE
SARS Coronavirus 2 by RT PCR: NEGATIVE

## 2021-07-07 LAB — TSH: TSH: 3.372 u[IU]/mL (ref 0.350–4.500)

## 2021-07-07 LAB — POC SARS CORONAVIRUS 2 AG: SARSCOV2ONAVIRUS 2 AG: NEGATIVE

## 2021-07-07 MED ORDER — TRAZODONE HCL 50 MG PO TABS
50.0000 mg | ORAL_TABLET | Freq: Every evening | ORAL | Status: DC | PRN
  Administered 2021-07-07 – 2021-07-09 (×2): 50 mg via ORAL
  Filled 2021-07-07 (×2): qty 1

## 2021-07-07 MED ORDER — HYDROXYZINE HCL 25 MG PO TABS: 25.0000 mg | ORAL_TABLET | Freq: Three times a day (TID) | ORAL | Status: DC | PRN

## 2021-07-07 MED ORDER — ALUM & MAG HYDROXIDE-SIMETH 200-200-20 MG/5ML PO SUSP: 30.0000 mL | ORAL | Status: DC | PRN

## 2021-07-07 MED ORDER — MAGNESIUM HYDROXIDE 400 MG/5ML PO SUSP
30.0000 mL | Freq: Every day | ORAL | Status: DC | PRN
  Administered 2021-07-08: 30 mL via ORAL
  Filled 2021-07-07: qty 30

## 2021-07-07 MED ORDER — ACETAMINOPHEN 325 MG PO TABS
650.0000 mg | ORAL_TABLET | Freq: Four times a day (QID) | ORAL | Status: DC | PRN
  Administered 2021-07-08: 650 mg via ORAL
  Filled 2021-07-07: qty 2

## 2021-07-07 NOTE — Progress Notes (Signed)
Patient resting on bed.  Ate lunch.  Continue to monitor for safety.

## 2021-07-07 NOTE — ED Notes (Signed)
Pt asleep in bed. Respirations even and unlabored. Will continue to monitor for safety. ?

## 2021-07-07 NOTE — Discharge Instructions (Addendum)
Take all medications as prescribed. Keep all follow-up appointments as scheduled.  Do not consume alcohol or use illegal drugs while on prescription medications. Report any adverse effects from your medications to your primary care provider promptly.  In the event of recurrent symptoms or worsening symptoms, call 911, a crisis hotline, or go to the nearest emergency department for evaluation.   

## 2021-07-07 NOTE — Clinical Social Work Psych Note (Signed)
CSW Initial Note   CSW met with [name] for introduction and to begin discussions regarding treatment and potential discharge planning.   [name] reports experiencing worsening anxiety and depressive symptoms, including suicidal ideation. [name] reports that he has struggled with an increase in his symptoms for the last 1.5 months.   [name] shared that he experienced an increase in his anxiety symptoms due to an upcoming foot surgery. He reports this is his second surgery and fears experiening the pain that was associated with the recovery process from his first surgery. [name] identifies this as the main stressor that contributed to this specific episode of increased symptomology.   [name] also shared that he suffers from complex grief. He reports his wife passed away 3 years ago and that his mother, oldest brother, 5yo nephew and two first cousins passed away 2 years ago from [employer].    While on the [facility], [name] is requesting mood and medication stabilization.   [name] reports he plans to return home, alone, once he has stabilized his crisis. He reports his son currently lives in [place] and his daughter lives in [place], however he has a support network that consists of church members and AA peers. [name] reports being established with a therapist at [facility], however is requesting referral services for medication management providers.   CSW will continue to follow.     [name], MSW, LCSW Clinical Education officer, museum ([place]) [facility]

## 2021-07-07 NOTE — Progress Notes (Signed)
Patient is A&O X4 . Endorsed with to be have a heart attack and die, but unable to answer if suicidal.  Denied HI, AVH.  Cooperative with admission process.  Skin check completed and belongings secured in locker # 11.  Patient oriented to unit and unit routines.  Offered food and fluids.  Will continue to monitor for safety.

## 2021-07-07 NOTE — Progress Notes (Signed)
Patient COVID negative. Annemarie, RN, here to walk patient over to [facility].

## 2021-07-07 NOTE — ED Notes (Signed)
Patient admitted to [facility] due to depression and suicidal ideation as a result of sadness around the holidays and the anniversary of family members deaths.  He currently denies avh shi or plan and agrees to seek out staff if overwhelmed by thoughts or feelings.  He was cooperative throughout the admission process and verbalized understanding of all reviewed.  Will monitor and provide safe supportive environment.

## 2021-07-07 NOTE — ED Provider Notes (Addendum)
Behavioral Health Admission H&P [facility] & OBS)  Date: 07/07/21 Patient Name: [name] MRN: [id number] Chief Complaint:  Chief Complaint  Patient presents with   Depression    Report symptoms of depression present past month and half, low energy, increased sleep, poor eating habits (eating junk food), and has not taken mental health medication past year.       Diagnoses:  Final diagnoses:  Severe episode of recurrent major depressive disorder, without psychotic features ([place])    HPI: [name] is a 61 year old African-American male that presents with worsening depression and anxiety.  He reports frequent passive suicidal ideations denied plan or intent.  Patient was recently seen by his primary care provider where he continued to endorse suicidal ideation. Patient was advised to follow-up for additional assessment.   [name] reports previous episode of depression 2 to 3 years prior after the passing of multiple family members.  He endorsed low mood, mood irritability and worsening depression.  Denied that he has been compliant with his current medications.    Chart review patient was prescribed Abilify and Wellbutrin however he reported he self discontinued few months prior.  Bonifacio reports some concerns with his health " I may have to have another foot surgery."  Which he reports is the only new stressors that he can identify at this time.  Discussed inpatient admission.  Patient was receptive to plan.  Per provider assessment note:"61 year old male seen today for follow up psychiatric evaluation.  He walked into the clinic for medication management.  He is currently managed on Abilify 5 mg daily and Wellbutrin XL 150 mg daily.  He notes that he discontinued these medications months ago.Today is well-groomed, pleasant, cooperative, and engaged in conversation.  Patient speech is slow and the volume is decreased.  He presents with poor eye contact.  Patient informed Probation officer that he has  seen better days.  He notes that he does not feel safe with himself and notes that he just wants to die.  He informed Probation officer that he discontinued his psychiatric medications a month ago medication months ago after he was feeling mentally stable.  He also notes that he has discontinued all other medications to treat his physical comorbidities (diabetes, hypertension, high cholesterol, and heart disease) as a means to harm himself.   He informed Probation officer that recently his brother-in-law passed away and notes that this is what he wants to do as well.  He denies having access to firearms and notes that he would like to pass by withdrawing medical care.  Patient notes that he does not have support of family member as his children live in different states and his wife has passed away.  He reports that he isolates himself and endorses symptoms of depression such as anhedonia, hypersomnia noting that he sleeps more than 12 hours a night, fluctuating appetite, increased weight, poor concentration, and anxiety.  Patient notes that he is not as active as he once was noting that he had not exercised in a month.  He also notes that it is difficult for him to complete activities of daily living such as bathing and eating.  He notes that when he does eat he eats unhealthy foods.  provider conducted a GAD-7 and P scored 19, at his last visit he scored a 7.  Provider also conducted a PHQ-9 and patient scored a 22, at his last visit he scored a 23.  Today he denies VAH, mania, paranoia"   Patient to be admitted to  facility based crisis for mood stabilization and medication management  PHQ 2-9:  Flowsheet Row Clinical Support from 07/07/2021 in [facility] Office Visit from 03/05/2021 in [place] Video Visit from 01/20/2021 in [place]  Thoughts that you would be better off dead, or of hurting yourself in some way Nearly every day Not at all Not at all  PHQ-9  Total Score _0 Flowsheet Row Clinical Support from 07/07/2021 in [facility] ED from 01/12/2021 in [facility] ED from 12/14/2020 in [facility] [place] Error: Q6 is Yes, you must answer 7 No Risk Error: Question 6 not populated        Total Time spent with patient: 15 minutes  Musculoskeletal  Strength & Muscle Tone: within normal limits Gait & Station: normal Patient leans: N/A  Psychiatric Specialty Exam  Presentation General Appearance: Appropriate for Environment Eye Contact:Good Speech:Clear and Coherent Speech Volume:Decreased Handedness:Right  Mood and Affect  Mood:Anxious; Depressed Affect:Congruent  Thought Process  Thought Processes:Coherent Descriptions of Associations:Intact  Orientation:Full (Time, Place and Person)  Thought Content:Logical    Hallucinations:Hallucinations: None  Ideas of Reference:None  Suicidal Thoughts:Suicidal Thoughts: Yes, Passive SI Passive Intent and/or Plan: Without Intent  Homicidal Thoughts:Homicidal Thoughts: No   Sensorium  Memory:Immediate Good; Recent Good Judgment:Fair Insight:Fair  Executive Functions  Concentration:Fair Attention Span:Good Recall:Good Fund of Knowledge:Good Language:No data recorded  Psychomotor Activity  Psychomotor Activity:Psychomotor Activity: Normal  Assets  Assets:Desire for Improvement; Armed forces logistics/support/administrative officer; Intimacy; Social Support  Sleep  Sleep:Sleep: Fair  Nutritional Assessment (For OBS and FBC admissions only) Has the patient had a weight loss or gain of 10 pounds or more in the last 3 months?: No Has the patient had a decrease in food intake/or appetite?: No Does the patient have dental problems?: No Does the patient have eating habits or behaviors that may be indicators of an eating disorder including binging or inducing vomiting?: No Has the patient recently lost  weight without trying?: 0 Has the patient been eating poorly because of a decreased appetite?: 0 Malnutrition Screening Tool Score: 0    Physical Exam Vitals and nursing note reviewed.  Cardiovascular:     Rate and Rhythm: Normal rate and regular rhythm.  Pulmonary:     Effort: Pulmonary effort is normal.  Neurological:     Mental Status: He is oriented to person, place, and time.  Psychiatric:        Attention and Perception: Attention normal.        Mood and Affect: Mood is depressed.        Speech: Speech normal.        Behavior: Behavior normal.        Thought Content: Thought content includes suicidal ideation.        Cognition and Memory: Cognition normal.   Review of Systems  Psychiatric/Behavioral:  Positive for depression. The patient is nervous/anxious.   All other systems reviewed and are negative.  Blood pressure (!) 127/94, pulse 71, temperature 97.8 F (36.6 C), temperature source Oral, resp. rate 16, SpO2 98 %. There is no height or weight on file to calculate BMI.  Past Psychiatric History: Major depressive disorder, generalized anxiety disorder.  Patient was prescribed Abilify and Wellbutrin however recently self discontinued medications.  Previous inpatient admissions.  Reported substance abuse history however states he has been sober for  the past 5 years.   Is the patient at risk to self? Yes  Has the patient been a risk to self in the past 6 months? Yes .    Has the patient been a risk to self within the distant past? Yes   Is the patient a risk to others? No   Has the patient been a risk to others in the past 6 months? No   Has the patient been a risk to others within the distant past? No   Past Medical History:  Past Medical History:  Diagnosis Date   Alcohol dependence ([place])    Allergies    Arthritis    Chest pain    Chronic lower back pain    Chronic pain of right wrist    Coronary artery disease    a. Multiple prior caths/PCI. Cath 2013 with  possible spasm of RCA, 70% ISR of mid LCx with subsequent DES to mLCx and prox LCX. b. H/o microvascular angina. c. Recurrent angina 08/2014 - s/p PTCA/DES to prox Cx, PTCA/CBA to OM1.  c. LHC 06/10/15 with patent stents and some ISR in LCX and OM-1 that was not flow limiting --> Rx    Dyslipidemia    a. Intolerant to many statins except tolerating Livalo.   GERD (gastroesophageal reflux disease)    H/O cardiac catheterization 10/25/2018   Heart attack [facility])    Hypertension    Myocardial infarction [facility]) ~ 2010   S/P angioplasty with stent, DES, to proximal and mid LCX 12/15/11 12/15/2011   S/P foot surgery, right 04/2021   Shoulder pain    Stroke ([place])    pt. reports had a stroke around time of MI 2010   Type II diabetes mellitus ([place])    Unstable angina [facility])     Past Surgical History:  Procedure Laterality Date   CARDIAC CATHETERIZATION  06/15/2002   LAD with prox 40% stenosis, norma L main, Cfx with 25% lesion, RCA with long mid 25% stenosis (Dr. Vita Barley)   CARDIAC CATHETERIZATION  04/01/2010   normal L main, LAD wit mild stenosis, L Cfx with 70% in-stent restenosis, RCA with 70% in-stent restenosis, LVEF >60% (Dr. K. Mali Hilty) - cutting ballon arthrectomy to RCA & Cfx (Dr. Rockne Menghini)   CARDIAC CATHETERIZATION  08/25/2010   preserved global LV contractility; multivessel CAD, diffuse 90-95% in-stent restenosis in prox placed Cfx stent - cutting balloon arthrectomy in Cfx with multiple dilatations 90-95% to 0% stenosis (Dr. Corky Downs)   CARDIAC CATHETERIZATION  01/26/2011   PCI & stenting of aggresive in-stent restenosis within previously stented AV groove Cfx with 3.0x58m Taxus DES (previous stents were Promus) (Dr. JAdora Fridge   CARDIAC CATHETERIZATION  05/11/2011   preserved LV function, 40% mid LAD stenosis, 30-40% narrowing proximal to stented semgnet of prox Cfx, patent mid RCA stent with smooth 20% narrowing in distal RCA (Dr. TCorky Downs   CARDIAC CATHETERIZATION  12/15/2011   PCI &  stenting of proximal & mid Cfx with DES - 3.0x153min proximal, 3.0x1573mn mid (Dr. J. Adora Fridge [place] 06/10/2015   Procedure: Left Heart Cath and Coronary Angiography;  Surgeon: DanJolaine ArtistD;  Location: [facility] [place] [facility];  Service: Cardiovascular;  Laterality: N/A;   CARDIAC CATHETERIZATION  04/81/85/6314cardiometabolic testing  08/17/68/2637good exercise effort, peak VO2 79% predicted with normal VO2 HR curves (mild deconditioning)   COLONOSCOPY  12/2012   diminutive hyperplastic sigmoid poyp so repeat  routine 2024   CORONARY BALLOON ANGIOPLASTY N/A 10/25/2018   Procedure: CORONARY BALLOON ANGIOPLASTY;  Surgeon: Jettie Booze, MD;  Location: [place] CV LAB;  Service: Cardiovascular;  Laterality: N/A;   CORONARY BALLOON ANGIOPLASTY N/A 09/29/2019   Procedure: CORONARY BALLOON ANGIOPLASTY;  Surgeon: Nelva Bush, MD;  Location: [place] CV LAB;  Service: Cardiovascular;  Laterality: N/A;   EXCISIONAL HEMORRHOIDECTOMY  1984   INTRAVASCULAR ULTRASOUND/IVUS N/A 09/29/2019   Procedure: Intravascular Ultrasound/IVUS;  Surgeon: Nelva Bush, MD;  Location: [place] CV LAB;  Service: Cardiovascular;  Laterality: N/A;   LEFT HEART CATH AND CORONARY ANGIOGRAPHY N/A 10/25/2018   Procedure: LEFT HEART CATH AND CORONARY ANGIOGRAPHY;  Surgeon: Jettie Booze, MD;  Location: [place] CV LAB;  Service: Cardiovascular;  Laterality: N/A;   LEFT HEART CATH AND CORONARY ANGIOGRAPHY N/A 09/29/2019   Procedure: LEFT HEART CATH AND CORONARY ANGIOGRAPHY;  Surgeon: Nelva Bush, MD;  Location: [place] [facility] LAB;  Service: Cardiovascular;  Laterality: N/A;   LEFT HEART CATHETERIZATION WITH CORONARY ANGIOGRAM N/A 05/11/2011   Procedure: LEFT HEART CATHETERIZATION WITH CORONARY ANGIOGRAM;  Surgeon: Troy Sine, MD;  Location: [facility] CATH LAB;  Service: Cardiovascular;  Laterality: N/A;  Possible percutaneous coronary intervention, possible IVUS   LEFT HEART  CATHETERIZATION WITH CORONARY ANGIOGRAM N/A 12/15/2011   Procedure: LEFT HEART CATHETERIZATION WITH CORONARY ANGIOGRAM;  Surgeon: Lorretta Harp, MD;  Location: [facility] CATH LAB;  Service: Cardiovascular;  Laterality: N/A;   LEFT HEART CATHETERIZATION WITH CORONARY ANGIOGRAM N/A 09/05/2014   Procedure: LEFT HEART CATHETERIZATION WITH CORONARY ANGIOGRAM;  Surgeon: Burnell Blanks, MD;  Location: [facility] CATH LAB;  Service: Cardiovascular;  Laterality: N/A;   LIPOMA EXCISION     back of the head   NM MYOCAR PERF WALL MOTION  02/2012   lexiscan myoview; mild perfusion defect in mid inferolateral & basal inferolateral region (infarct/scar); EF 52%, abnormal but ow risk scan   PERCUTANEOUS CORONARY STENT INTERVENTION (PCI-S)  09/05/2014   Procedure: PERCUTANEOUS CORONARY STENT INTERVENTION (PCI-S);  Surgeon: Burnell Blanks, MD;  Location: [facility] CATH LAB;  Service: Cardiovascular;;   TONSILLECTOMY      Family History:  Family History  Problem Relation Age of Onset   Leukemia Mother    Prostate cancer Father    Cancer Brother    Coronary artery disease Paternal Grandmother    Cancer Paternal Grandfather    Migraines Neg Hx    Headache Neg Hx     Social History:  Social History   Socioeconomic History   Marital status: Widowed    Spouse name: Not on file   Number of children: 2   Years of education: GED   Highest education level: Not on file  Occupational History   Not on file  Tobacco Use   Smoking status: Former    Packs/day: 1.00    Years: 10.00    Pack years: 10.00    Types: Cigarettes    Quit date: 10/07/2018    Years since quitting: 2.7   Smokeless tobacco: Never  Vaping Use   Vaping Use: Never used  Substance and Sexual Activity   Alcohol use: Not Currently    Comment: 2 bottles of wine on 12/08/2019 (one occurrence)   Drug use: Not Currently    Types: Cocaine    Comment: last use several months ago   Sexual activity: Not Currently  Other Topics Concern   Not on  file  Social History Narrative   ** Merged History Encounter **  Social Determinants of Health   Financial Resource Strain: Not on file  Food Insecurity: Not on file  Transportation Needs: Not on file  Physical Activity: Not on file  Stress: Not on file  Social Connections: Not on file  Intimate Partner Violence: Not on file    SDOH:  SDOH Screenings   Alcohol Screen: Medium Risk   Last Alcohol Screening Score (AUDIT): 27  Depression (PHQ2-9): Medium Risk   PHQ-2 Score: 22  Financial Resource Strain: Not on file  Food Insecurity: Not on file  Housing: Not on file  Physical Activity: Not on file  Social Connections: Not on file  Stress: Not on file  Tobacco Use: Medium Risk   Smoking Tobacco Use: Former   Smokeless Tobacco Use: Never   Passive Exposure: Not on file  Transportation Needs: Not on file    Last Labs:  Office Visit on 04/24/2021  Component Date Value Ref Range Status   Testosterone 04/24/2021 327  264 - 916 ng/dL Final   Comment: Adult male reference interval is based on a population of healthy nonobese males (BMI <30) between 32 and 68 years old. [place], [place] [phone]. PMID: [id number].   Office Visit on 03/05/2021  Component Date Value Ref Range Status   Color, UA 03/05/2021 yellow  yellow Final   Clarity, UA 03/05/2021 clear  clear Final   Glucose, UA 03/05/2021 negative  negative mg/dL Final   Bilirubin, UA 03/05/2021 negative  negative Final   Ketones, POC UA 03/05/2021 negative  negative mg/dL Final   Spec Grav, UA 03/05/2021 1.020  1.010 - 1.025 Final   Blood, UA 03/05/2021 negative  negative Final   pH, UA 03/05/2021 6.0  5.0 - 8.0 Final   POC PROTEIN,UA 03/05/2021 negative  negative, trace Final   Urobilinogen, UA 03/05/2021 0.2  0.2 or 1.0 E.U./dL Final   Nitrite, UA 03/05/2021 Negative  Negative Final   Leukocytes, UA 03/05/2021 Negative  Negative Final   Hemoglobin A1C 03/05/2021 5.0  4.0 - 5.6 % Final   HbA1c POC  (<> result, manual entry) 03/05/2021 5.0  4.0 - 5.6 % Final   HbA1c, POC (prediabetic range) 03/05/2021 5.0 (A)  5.7 - 6.4 % Final   HbA1c, POC (controlled diabetic ra* 03/05/2021 5.0  0.0 - 7.0 % Final   POC Glucose 03/05/2021 122 (A)  70 - 99 mg/dl Final   Cholesterol, Total 03/05/2021 152  100 - 199 mg/dL Final   Triglycerides 03/05/2021 99  0 - 149 mg/dL Final   HDL 03/05/2021 44  >39 mg/dL Final   VLDL Cholesterol Cal 03/05/2021 18  5 - 40 mg/dL Final   LDL Chol Calc (NIH) 03/05/2021 90  0 - 99 mg/dL Final   Chol/HDL Ratio 03/05/2021 3.5  0.0 - 5.0 ratio Final   Comment:                                   T. Chol/HDL Ratio                                             Men  Women                               1/2 Avg.Risk  3.4  3.3                                   Avg.Risk  5.0    4.4                                2X Avg.Risk  9.6    7.1                                3X Avg.Risk 23.4   11.0    Glucose 03/05/2021 103 (H)  65 - 99 mg/dL Final   BUN 03/05/2021 13  6 - 24 mg/dL Final   Creatinine, Ser 03/05/2021 0.92  0.76 - 1.27 mg/dL Final   eGFR 03/05/2021 96  >59 mL/min/1.73 Final   BUN/Creatinine Ratio 03/05/2021 14  9 - 20 Final   Sodium 03/05/2021 142  134 - 144 mmol/L Final   Potassium 03/05/2021 4.8  3.5 - 5.2 mmol/L Final   Chloride 03/05/2021 103  96 - 106 mmol/L Final   Calcium 03/05/2021 9.7  8.7 - 10.2 mg/dL Final   Total Protein 03/05/2021 7.2  6.0 - 8.5 g/dL Final   Albumin 03/05/2021 5.0 (H)  3.8 - 4.9 g/dL Final   Globulin, Total 03/05/2021 2.2  1.5 - 4.5 g/dL Final   Albumin/Globulin Ratio 03/05/2021 2.3 (H)  1.2 - 2.2 Final   Bilirubin Total 03/05/2021 0.4  0.0 - 1.2 mg/dL Final   Alkaline Phosphatase 03/05/2021 64  44 - 121 IU/L Final   AST 03/05/2021 24  0 - 40 IU/L Final  Office Visit on 02/12/2021  Component Date Value Ref Range Status   Glucose, UA 02/12/2021 Negative  Negative Final   Bilirubin, UA 02/12/2021 neg   Final   Ketones, UA 02/12/2021 neg    Final   Spec Grav, UA 02/12/2021 <=1.005 (A)  1.010 - 1.025 Final   Blood, UA 02/12/2021 neg   Final   pH, UA 02/12/2021 6.5  5.0 - 8.0 Final   Protein, UA 02/12/2021 Negative  Negative Final   Urobilinogen, UA 02/12/2021 0.2  0.2 or 1.0 E.U./dL Final   Nitrite, UA 02/12/2021 neg   Final   Leukocytes, UA 02/12/2021 Negative  Negative Final   Hemoglobin A1C 02/12/2021 6.2 (A)  4.0 - 5.6 % Final  Orders Only on 01/21/2021  Component Date Value Ref Range Status   Vit D, 25-Hydroxy 01/21/2021 44.9  30.0 - 100.0 ng/mL Final   Comment: Vitamin D deficiency has been defined by the [place] practice guideline as a level of serum 25-OH vitamin D less than 20 ng/mL (1,2). The Endocrine Society went on to further define vitamin D insufficiency as a level between 21 and 29 ng/mL (2). 1. IOM (Institute of Medicine). 2010. Dietary reference    intakes for calcium and D. [place]: The    [employer]. 2. Holick MF, Binkley [place], Bischoff-Ferrari HA, et al.    Evaluation, treatment, and prevention of vitamin D    deficiency: an Endocrine Society clinical practice    guideline. JCEM. 2011 Jul; 96(7):1911-30.    Testosterone 01/21/2021 140 (L)  264 - 916 ng/dL Final   Comment: Adult male reference interval is based on a population of healthy nonobese males (BMI <30) between 19 and 39  years old. [place], [place] [phone]. PMID: [id number].    Vitamin B-12 01/21/2021 537  232 - 1,245 pg/mL Final   Magnesium 01/21/2021 1.9  1.6 - 2.3 mg/dL Final    Allergies: Bee venom, Gadolinium, Gadolinium derivatives, Shellfish allergy, Statins, Statins, and Testosterone cypionate  PTA Medications: (Not in a hospital admission)   Medical Decision Making  Patient to be admitted to facility based crisis -Patient to follow-up with psychiatry for medication management    Recommendations  Based on my evaluation the patient appears to have an  emergency medical condition for which I recommend the patient be transferred to the emergency department for further evaluation.  Derrill [place], NP 07/07/21  11:34 AM

## 2021-07-07 NOTE — BH Assessment (Signed)
Comprehensive Clinical Assessment (CCA) Note  07/07/2021 BURRIS MATHERNE [id number]  Disposition: Per Ricky Ala, NP, patient is recommended admission to [facility].   [place] ED from 07/07/2021 in [facility] Most recent reading at 07/07/2021 11:59 AM Clinical Support from 07/07/2021 in [facility] Most recent reading at 07/07/2021  9:57 AM ED from 01/12/2021 in [facility] [place] Most recent reading at 01/12/2021 12:18 PM  C-SSRS RISK CATEGORY High Risk Error: Q6 is Yes, you must answer 7 No Risk      The patient demonstrates the following risk factors for suicide: Chronic risk factors for suicide include: psychiatric disorder of MDD, substance use disorder, previous suicide attempts unknown, medical illness heart disease and others, and demographic factors (male, >80 y/o). Acute risk factors for suicide include: loss (financial, interpersonal, professional). Protective factors for this patient include: positive social support and positive therapeutic relationship. Considering these factors, the overall suicide risk at this point appears to be high. Patient is not appropriate for outpatient follow up.   Zein Helbing is a 61 year old male presenting to [facility] voluntarily after his outpatient appointment at [facility]. Patient reports worsening depressive symptoms for the past month. Patient reports he has isolated himself and stopped attending to his medical needs. Patient reports he lays in the bed most days due to worsening depression. Patient reports the death of several family members in a short time period. Patient reports history of inpatient treatment and states that he was once told while in inpatient treatment that he needed a reset and that's what he needs at this time. Patient also reports having to have another foot surgery which has caused some anxiety.   Patient is oriented x5, alert, engaged and cooperative. Patient eye  contact is normal, speech is clear, and his affect is depressed with congruent mood. Patient reports suicidal ideations and states I have had some thoughts but does not give a defiant plan. Patient has a history of suicide attempts. Pt denies HI, AVH and substance use however has a history of alcohol use.    Chief Complaint:  Chief Complaint  Patient presents with   Depression    Report symptoms of depression present past month and half, low energy, increased sleep, poor eating habits (eating junk food), and has not taken mental health medication past year.    Visit Diagnosis: Severe episode of recurrent major depressive disorder, without psychotic features ([place])    CCA Screening, Triage and Referral (STR)  Patient Reported Information How did you hear about [place]? Self  What Is the Reason for Your Visit/Call Today? 61 year old Connell Bognar present to [facility] after been referred by Dr. Bradley Ferris. Patient report depressive symptoms present past month and half which includes low energy, increased sleep (report in bed all day), poor diet (only eating junk food) and report not taking anti-depressant medications for the past year. Depressive symptoms triggered by medical issues (pain). Recent had foot surgery and may have to have a 2nd surgery. Report the thought of having to deal with all the pain assoicated with the surgery is making him depressed. Report suicial ideations present 2-3 weeks. Patient is purposely not taking medication heart disease, high blood pressure, type 2 diabetes, and high cholesterol. He reports he hopes to just have an heart attack and pass away. Denied homicidal ideations and denied auditory/visual hallucinations. Patient is widowed wife past away 3 years ago of breast cancel. Two children son lives in [place] and daughter in [place]. Report his children does not know about his mental health struggles. Denied weapons in the home. History of past suicide attempts 1st attempt 2000  (drove car off a bridge) and 2nd attempt in 2005 (attempt overdose). Denied substance use.  How Long Has This Been Causing You Problems? 1 wk - 1 month  What Do You Feel Would Help You the Most Today? Stress Management; Medication(s)   Have You Recently Had Any Thoughts About Hurting Yourself? Yes  Are You Planning to Commit Suicide/Harm Yourself At This time? Yes (past stopped taking medications in an attempt to have medical complication and pass away.)   Have you Recently Had Thoughts About [place]? No  Are You Planning to Harm Someone at This Time? No  Explanation: No data recorded  Have You Used Any Alcohol or Drugs in the Past 24 Hours? No  How Long Ago Did You Use Drugs or Alcohol? No data recorded What Did You Use and How Much? No data recorded  Do You Currently Have a Therapist/Psychiatrist? No data recorded Name of Therapist/Psychiatrist: No data recorded  Have You Been Recently Discharged From Any Office Practice or Programs? No  Explanation of Discharge From Practice/Program: No data recorded    CCA Screening Triage Referral Assessment Type of Contact: Face-to-Face  Telemedicine Service Delivery:   Is this Initial or Reassessment? No data recorded Date Telepsych consult ordered in [facility]:  No data recorded Time Telepsych consult ordered in [facility]:  No data recorded Location of Assessment: [facility] Assessment Services  Provider Location: [facility] Assessment Services   Collateral Involvement: none   Does Patient Have a [place]? No data recorded Name and Contact of Legal Guardian: No data recorded If Minor and Not Living with Parent(s), Who has Custody? No data recorded Is CPS involved or ever been involved? Never  Is APS involved or ever been involved? Never   Patient Determined To Be At Risk for Harm To Self or Others Based on Review of Patient Reported Information or Presenting Complaint? Yes, for Self-Harm  Method: No data  recorded Availability of Means: No data recorded Intent: No data recorded Notification Required: No data recorded Additional Information for Danger to Others Potential: No data recorded Additional Comments for Danger to Others Potential: No data recorded Are There Guns or Other Weapons in Your Home? No data recorded Types of Guns/Weapons: No data recorded Are These Weapons Safely Secured?                            No data recorded Who Could Verify You Are Able To Have These Secured: No data recorded Do You Have any Outstanding Charges, Pending Court Dates, Parole/Probation? No data recorded Contacted To Inform of Risk of Harm To Self or Others: No data recorded   Does Patient Present under Involuntary Commitment? No  IVC Papers Initial File Date: No data recorded  [place] of Residence: [place]   Patient Currently Receiving the Following Services: Individual Therapy; Medication Management   Determination of Need: Emergent (2 hours)   Options For Referral: Medication Management; Facility-Based Crisis     CCA Biopsychosocial Patient Reported Schizophrenia/Schizoaffective Diagnosis in Past: No data recorded  Strengths: No data recorded  Mental Health Symptoms Depression:   Hopelessness; Worthlessness; Increase/decrease in appetite; Tearfulness; Weight gain/loss; Sleep (too much or little); Change in energy/activity; Difficulty Concentrating; Fatigue ("Existing and not living.")   Duration of Depressive symptoms:  Duration of Depressive  Symptoms: Greater than two weeks   Mania:   None   Anxiety:    Worrying; Irritability; Restlessness   Psychosis:   None   Duration of Psychotic symptoms:    Trauma:   None   Obsessions:   None   Compulsions:   None   Inattention:   None   Hyperactivity/Impulsivity:   N/A   Oppositional/Defiant Behaviors:   None   Emotional Irregularity:   None   Other Mood/Personality Symptoms:   None    Mental Status  Exam Appearance and self-care  Stature:   Average   Weight:   Average weight   Clothing:   Neat/clean; Age-appropriate   Grooming:   Well-groomed   Cosmetic use:   None   Posture/gait:   Normal   Motor activity:   Not Remarkable   Sensorium  Attention:   Normal   Concentration:   Normal   Orientation:   X5   Recall/memory:   Normal   Affect and Mood  Affect:   Appropriate   Mood:   Other (Comment) (Pleasant)   Relating  Eye contact:   Normal   Facial expression:   Responsive   Attitude toward examiner:   Cooperative   Thought and Language  Speech flow:  Clear and Coherent   Thought content:   Appropriate to Mood and Circumstances   Preoccupation:   None   Hallucinations:   None   Organization:  No data recorded  [employer] of Knowledge:   Average   Intelligence:   Average   Abstraction:   Normal   Judgement:   Normal   Reality Testing:   Adequate   Insight:   Good   Decision Making:   Normal   Social Functioning  Social Maturity:  No data recorded  Social Judgement:   Normal   Stress  Stressors:   Grief/losses; Family conflict; Financial; Other (Comment); Illness (Addiction)   Coping Ability:   Resilient   Skill Deficits:   None   Supports:   Friends/Service system; Family; Other (Comment) ([facility])     Religion: Religion/Spirituality Are You A Religious Person?: Yes How Might This Affect Treatment?: None  Leisure/Recreation: Leisure / Recreation Do You Have Hobbies?: Yes  Exercise/Diet: Exercise/Diet Do You Exercise?: Yes Have You Gained or Lost A Significant Amount of Weight in the Past Six Months?: Yes-Lost Do You Follow a Special Diet?: Yes Do You Have Any Trouble Sleeping?: No   CCA Employment/Education Employment/Work Situation: Employment / Work Technical sales engineer: On disability Patient's Job has Been Impacted by Current Illness: No Has Patient  ever Been in the [employer]?: No  Education: Education Last Grade Completed: 76 Did [place]?: No Did You Have An Individualized Education Program (IIEP): No Did You Have Any Difficulty At [employer]?: No   CCA Family/Childhood History Family and Relationship History: Family history Does patient have children?: Yes  Childhood History:  Childhood History By whom was/is the patient raised?: Both parents Did patient suffer any verbal/emotional/physical/sexual abuse as a child?: No Has patient ever been sexually abused/assaulted/raped as an adolescent or adult?: No Witnessed domestic violence?: No Has patient been affected by domestic violence as an adult?: No  Child/Adolescent Assessment:     CCA Substance Use Alcohol/Drug Use: Alcohol / Drug Use Pain Medications: See MAR Prescriptions: See MAR Over the Counter: See MAR History of alcohol / drug use?: Yes Longest period of sobriety (when/how long): 4 years and 1 month Negative Consequences of  Use: Financial, Personal relationships Withdrawal Symptoms: Blackouts                         ASAM's:  Six Dimensions of Multidimensional Assessment  Dimension 1:  Acute Intoxication and/or Withdrawal Potential:   Dimension 1:  Description of individual's past and current experiences of substance use and withdrawal: Blackouts  Dimension 2:  Biomedical Conditions and Complications:   Dimension 2:  Description of patient's biomedical conditions and  complications: Heart disease  Dimension 3:  Emotional, Behavioral, or Cognitive Conditions and Complications:  Dimension 3:  Description of emotional, behavioral, or cognitive conditions and complications: Major Depressive Disorder  Dimension 4:  Readiness to Change:  Dimension 4:  Description of Readiness to Change criteria: Action Stage of Change  Dimension 5:  Relapse, Continued use, or Continued Problem Potential:  Dimension 5:  Relapse, continued use, or continued problem  potential critiera description: High Risk for Relapse  Dimension 6:  Recovery/Living Environment:  Dimension 6:  Recovery/Iiving environment criteria description: Pt reports living in a supportive recovery environment, sober since June  ASAM Severity Score: ASAM's Severity Rating Score: 13  ASAM Recommended Level of Treatment:     Substance use Disorder (SUD) Substance Use Disorder (SUD)  Checklist Symptoms of Substance Use: Continued use despite having a persistent/recurrent physical/psychological problem caused/exacerbated by use, Continued use despite persistent or recurrent social, interpersonal problems, caused or exacerbated by use, Evidence of tolerance, Large amounts of time spent to obtain, use or recover from the substance(s), Persistent desire or unsuccessful efforts to cut down or control use, Recurrent use that results in a failure to fulfill major role obligations (work, school, home), Presence of craving or strong urge to use, Evidence of withdrawal (Comment), Social, occupational, recreational activities given up or reduced due to use, Repeated use in physically hazardous situations, Substance(s) often taken in larger amounts or over longer times than was intended  Recommendations for Services/Supports/Treatments: Recommendations for Services/Supports/Treatments Recommendations For Services/Supports/Treatments: Medication Management, SAIOP (Substance Abuse Intensive Outpatient Program)  Discharge Disposition:    DSM5 Diagnoses: Patient Active Problem List   Diagnosis Date Noted   Suicidal ideation 07/07/2021   Generalized anxiety disorder 07/07/2021   Other allergic rhinitis 09/09/2020   Adverse reaction to food, subsequent encounter 09/09/2020   History of bee sting allergy 09/09/2020   Alcohol dependence with alcohol-induced mood disorder ([place]) 03/19/2020   MDD (major depressive disorder), recurrent episode, severe ([place]) 03/18/2020   History of substance abuse ([place])  01/04/2020   Severe 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 features ([place]) 12/10/2019   Tinea cruris 07/06/2019   Hemoglobin A1c less than 7.0% 07/06/2019   Muscle strain of wrist 04/05/2019   Pain in both wrists 04/05/2019   Anxiety 04/05/2019   Prediabetes 04/05/2019   MDD (major depressive disorder) 01/12/2019   Chest pain 10/25/2018   MDD (major depressive disorder), recurrent severe, without psychosis ([place]) 08/15/2018   Influenza A 07/17/2017   Lactic acidosis    Cough    Nausea and vomiting    Severe episode of recurrent major depressive disorder, without psychotic features ([place])    MDD (major depressive disorder), single episode, severe with psychotic features ([place]) 06/27/2017   MDD (major depressive disorder), severe ([place]) 06/23/2017   Essential hypertension 01/13/2016   Unstable angina ([place])    ED (erectile dysfunction) 02/15/2014   Atypical chest pain 12/14/2011   Allergy to contrast media (used for diagnostic x-rays) 05/12/2011   Dyslipidemia, goal LDL below 70 05/09/2011  DM2 (diabetes mellitus, type 2) ([place]) 07/10/2010   CAD S/P percutaneous coronary angioplasty 07/10/2010     Referrals to Alternative Service(s): Referred to Alternative Service(s):   Place:   Date:   Time:    Referred to Alternative Service(s):   Place:   Date:   Time:    Referred to Alternative Service(s):   Place:   Date:   Time:    Referred to Alternative Service(s):   Place:   Date:   Time:     Luther Redo, [facility]

## 2021-07-07 NOTE — Progress Notes (Signed)
BH MD/PA/NP OP Progress Note  07/07/2021 10:22 AM [name]  MRN:  [id number]  Chief Complaint: I've seen better days. I do not feel safe with myself and I just want to die." Chief Complaint   WALK-IN     HPI: 61 year old male seen today for follow up psychiatric evaluation.  He walked into the clinic for medication management.  He is currently managed on Abilify 5 mg daily and Wellbutrin XL 150 mg daily.  He notes that he discontinued these medications months ago.  Today is well-groomed, pleasant, cooperative, and engaged in conversation.  Patient speech is slow and the volume is decreased.  He presents with poor eye contact.  Patient informed Probation officer that he has seen better days.  He notes that he does not feel safe with himself and notes that he just wants to die.  He informed Probation officer that he discontinued his psychiatric medications a month ago medication months ago after he was feeling mentally stable.  He also notes that he has discontinued all other medications to treat his physical comorbidities (diabetes, hypertension, high cholesterol, and heart disease) as a means to harm himself.   He informed Probation officer that recently his brother-in-law passed away and notes that this is what he wants to do as well.  He denies having access to firearms and notes that he would like to pass by withdrawing medical care.  Patient notes that he does not have support of family member as his children live in different states and his wife has passed away.  He reports that he isolates himself and endorses symptoms of depression such as anhedonia, hypersomnia noting that he sleeps more than 12 hours a night, fluctuating appetite, increased weight, poor concentration, and anxiety.  Patient notes that he is not as active as he once was noting that he had not exercised in a month.  He also notes that it is difficult for him to complete activities of daily living such as bathing and eating.  He notes that when he does eat he  eats unhealthy foods.  provider conducted a GAD-7 and P scored 19, at his last visit he scored a 7.  Provider also conducted a PHQ-9 and patient scored a 22, at his last visit he scored a 23.  Today he denies VAH, mania, paranoia.  Patient notes that he is in pain most days which to exacerbate his anxiety and depression.  Patient reports that he has plantars fasciitis and notes that he also has back and neck pain.  Currently he is not taking medications to help relieve this pain.  He notes that exercise, dieting, and stretching help in the past however he reports that he does not want to do these activities anymore.  At this time medications not restarted.  Provider spoke to clinicians at [facility] regarding patient.  Patient went voluntarily for further assessment.  Patient notes that he does has not went to counseling at [facility] for months and notes that he would like to restart when he becomes mentally stable. No other concerns at this time.   Visit Diagnosis:    ICD-10-CM   1. MDD (major depressive disorder), recurrent severe, without psychosis ([place])  F33.2     2. Suicidal ideation  R45.851     3. Generalized anxiety disorder  F41.1       Past Psychiatric History: Depression and alcohol abuse  Past Medical History:  Past Medical History:  Diagnosis Date   Alcohol dependence ([place])    Allergies  Arthritis    Chest pain    Chronic lower back pain    Chronic pain of right wrist    Coronary artery disease    a. Multiple prior caths/PCI. Cath 2013 with possible spasm of RCA, 70% ISR of mid LCx with subsequent DES to mLCx and prox LCX. b. H/o microvascular angina. c. Recurrent angina 08/2014 - s/p PTCA/DES to prox Cx, PTCA/CBA to OM1.  c. LHC 06/10/15 with patent stents and some ISR in LCX and OM-1 that was not flow limiting --> Rx    Dyslipidemia    a. Intolerant to many statins except tolerating Livalo.   GERD (gastroesophageal reflux disease)    H/O cardiac catheterization 10/25/2018    Heart attack [facility])    Hypertension    Myocardial infarction [facility]) ~ 2010   S/P angioplasty with stent, DES, to proximal and mid LCX 12/15/11 12/15/2011   S/P foot surgery, right 04/2021   Shoulder pain    Stroke ([place])    pt. reports had a stroke around time of MI 2010   Type II diabetes mellitus ([place])    Unstable angina [facility])     Past Surgical History:  Procedure Laterality Date   CARDIAC CATHETERIZATION  06/15/2002   LAD with prox 40% stenosis, norma L main, Cfx with 25% lesion, RCA with long mid 25% stenosis (Dr. Vita Barley)   CARDIAC CATHETERIZATION  04/01/2010   normal L main, LAD wit mild stenosis, L Cfx with 70% in-stent restenosis, RCA with 70% in-stent restenosis, LVEF >60% (Dr. K. Mali Hilty) - cutting ballon arthrectomy to RCA & Cfx (Dr. Rockne Menghini)   CARDIAC CATHETERIZATION  08/25/2010   preserved global LV contractility; multivessel CAD, diffuse 90-95% in-stent restenosis in prox placed Cfx stent - cutting balloon arthrectomy in Cfx with multiple dilatations 90-95% to 0% stenosis (Dr. Corky Downs)   CARDIAC CATHETERIZATION  01/26/2011   PCI & stenting of aggresive in-stent restenosis within previously stented AV groove Cfx with 3.0x86mm Taxus DES (previous stents were Promus) (Dr. Adora Fridge)   CARDIAC CATHETERIZATION  05/11/2011   preserved LV function, 40% mid LAD stenosis, 30-40% narrowing proximal to stented semgnet of prox Cfx, patent mid RCA stent with smooth 20% narrowing in distal RCA (Dr. Corky Downs)   CARDIAC CATHETERIZATION  12/15/2011   PCI & stenting of proximal & mid Cfx with DES - 3.0x29mm in proximal, 3.0x10mm in mid (Dr. Adora Fridge)   [place] N/A 06/10/2015   Procedure: Left Heart Cath and Coronary Angiography;  Surgeon: Jolaine Artist, MD;  Location: [place] [facility];  Service: Cardiovascular;  Laterality: N/A;   CARDIAC CATHETERIZATION  35/32/9924   cardiometabolic testing  2/68/3419   good exercise effort, peak VO2 79% predicted with normal VO2  HR curves (mild deconditioning)   COLONOSCOPY  12/2012   diminutive hyperplastic sigmoid poyp so repeat routine 2024   CORONARY BALLOON ANGIOPLASTY N/A 10/25/2018   Procedure: CORONARY BALLOON ANGIOPLASTY;  Surgeon: Jettie Booze, MD;  Location: [place] [facility];  Service: Cardiovascular;  Laterality: N/A;   CORONARY BALLOON ANGIOPLASTY N/A 09/29/2019   Procedure: CORONARY BALLOON ANGIOPLASTY;  Surgeon: Nelva Bush, MD;  Location: [place] [facility];  Service: Cardiovascular;  Laterality: N/A;   EXCISIONAL HEMORRHOIDECTOMY  1984   INTRAVASCULAR ULTRASOUND/IVUS N/A 09/29/2019   Procedure: Intravascular Ultrasound/IVUS;  Surgeon: Nelva Bush, MD;  Location: [place] [facility];  Service: Cardiovascular;  Laterality: N/A;   LEFT HEART CATH AND CORONARY ANGIOGRAPHY N/A 10/25/2018   Procedure: LEFT  HEART CATH AND CORONARY ANGIOGRAPHY;  Surgeon: Jettie Booze, MD;  Location: [place] [facility];  Service: Cardiovascular;  Laterality: N/A;   LEFT HEART CATH AND CORONARY ANGIOGRAPHY N/A 09/29/2019   Procedure: LEFT HEART CATH AND CORONARY ANGIOGRAPHY;  Surgeon: Nelva Bush, MD;  Location: [place] [facility];  Service: Cardiovascular;  Laterality: N/A;   LEFT HEART CATHETERIZATION WITH CORONARY ANGIOGRAM N/A 05/11/2011   Procedure: LEFT HEART CATHETERIZATION WITH CORONARY ANGIOGRAM;  Surgeon: Troy Sine, MD;  Location: [facility] CATH LAB;  Service: Cardiovascular;  Laterality: N/A;  Possible percutaneous coronary intervention, possible IVUS   LEFT HEART CATHETERIZATION WITH CORONARY ANGIOGRAM N/A 12/15/2011   Procedure: LEFT HEART CATHETERIZATION WITH CORONARY ANGIOGRAM;  Surgeon: Lorretta Harp, MD;  Location: [facility] CATH LAB;  Service: Cardiovascular;  Laterality: N/A;   LEFT HEART CATHETERIZATION WITH CORONARY ANGIOGRAM N/A 09/05/2014   Procedure: LEFT HEART CATHETERIZATION WITH CORONARY ANGIOGRAM;  Surgeon: Burnell Blanks, MD;  Location: [facility] CATH LAB;  Service: Cardiovascular;   Laterality: N/A;   LIPOMA EXCISION     back of the head   NM MYOCAR PERF WALL MOTION  02/2012   lexiscan myoview; mild perfusion defect in mid inferolateral & basal inferolateral region (infarct/scar); EF 52%, abnormal but ow risk scan   PERCUTANEOUS CORONARY STENT INTERVENTION (PCI-S)  09/05/2014   Procedure: PERCUTANEOUS CORONARY STENT INTERVENTION (PCI-S);  Surgeon: Burnell Blanks, MD;  Location: [facility] CATH LAB;  Service: Cardiovascular;;   TONSILLECTOMY      Family Psychiatric History: Maternal grandfather alcohol use, maternal and paternal uncles and aunts alcohol use, oldest brother/sister alcohol and illegal substance.     Family History:  Family History  Problem Relation Age of Onset   Leukemia Mother    Prostate cancer Father    Cancer Brother    Coronary artery disease Paternal Grandmother    Cancer Paternal Grandfather    Migraines Neg Hx    Headache Neg Hx     Social History:  Social History   Socioeconomic History   Marital status: Widowed    Spouse name: Not on file   Number of children: 2   Years of education: GED   Highest education level: Not on file  Occupational History   Not on file  Tobacco Use   Smoking status: Former    Packs/day: 1.00    Years: 10.00    Pack years: 10.00    Types: Cigarettes    Quit date: 10/07/2018    Years since quitting: 2.7   Smokeless tobacco: Never  Vaping Use   Vaping Use: Never used  Substance and Sexual Activity   Alcohol use: Not Currently    Comment: 2 bottles of wine on 12/08/2019 (one occurrence)   Drug use: Not Currently    Types: Cocaine    Comment: last use several months ago   Sexual activity: Not Currently  Other Topics Concern   Not on file  Social History Narrative   ** Merged History Encounter **       Social Determinants of Health   Financial Resource Strain: Not on file  Food Insecurity: Not on file  Transportation Needs: Not on file  Physical Activity: Not on file  Stress: Not on file   Social Connections: Not on file    Allergies:  Allergies  Allergen Reactions   Bee Venom Anaphylaxis and Hives   Gadolinium Itching and Swelling    Swelling of eyes after receiving MR contrast. 13 hr prep recommended.  Gadolinium Derivatives Hives    Pt developed large hive and swelling under right eye after contrast, given $RemoveBeforeD'50mg'[id number]$  iv benadryl, pt will need premed before gadolinium//lh   Shellfish Allergy Anaphylaxis and Hives   Statins Other (See Comments)    Myalgias. Tolerating livalo. Pain    Statins    Testosterone Cypionate     Testerone Injection --Increased breast tissue     Metabolic Disorder Labs: Lab Results  Component Value Date   HGBA1C 5.0 03/05/2021   HGBA1C 5.0 03/05/2021   HGBA1C 5.0 (A) 03/05/2021   HGBA1C 5.0 03/05/2021   MPG 136.98 03/16/2020   MPG 151.33 12/12/2019   Lab Results  Component Value Date   PROLACTIN 13.0 03/17/2020   Lab Results  Component Value Date   CHOL 152 03/05/2021   TRIG 99 03/05/2021   HDL 44 03/05/2021   CHOLHDL 3.5 03/05/2021   VLDL 47 (H) 03/17/2020   LDLCALC 90 03/05/2021   LDLCALC 99 10/31/2020   Lab Results  Component Value Date   TSH 1.089 03/19/2020   TSH 4.162 03/16/2020    Therapeutic Level Labs: No results found for: LITHIUM No results found for: VALPROATE No components found for:  CBMZ  Current Medications: Current Outpatient Medications  Medication Sig Dispense Refill   Accu-Chek Softclix Lancets lancets SMARTSIG:1 Topical 4 Times Daily     amLODipine (NORVASC) 10 MG tablet Take 1 tablet by mouth once daily 90 tablet 0   aspirin EC 81 MG EC tablet Take 1 tablet (81 mg total) by mouth daily. (May buy from over the counter): Swallow whole for heart health 30 tablet 0   blood glucose meter kit and supplies KIT Dispense based on patient and insurance preference. Use up to four times daily as directed. (FOR ICD-9 250.00, 250.01). 1 each 0   Blood Pressure Monitor KIT 1 kit by Does not apply route daily.  1 kit 0   clopidogrel (PLAVIX) 75 MG tablet Take 1 tablet (75 mg total) by mouth daily. 90 tablet 1   EPINEPHrine 0.3 mg/0.3 mL IJ SOAJ injection Inject 0.3 mg into the muscle as needed for anaphylaxis. 1 each 2   glucose blood (ACCU-CHEK GUIDE) test strip Use as instructed 300 each 3   LIVALO 4 MG TABS Take 1 tablet (4 mg total) by mouth daily. 90 tablet 3   metFORMIN (GLUCOPHAGE) 500 MG tablet TAKE 1 TABLET BY MOUTH TWICE DAILY WITH A MEAL 180 tablet 2   metoprolol tartrate (LOPRESSOR) 100 MG tablet Take 0.5 tablets (50 mg total) by mouth 2 (two) times daily. 180 tablet 3   nitroGLYCERIN (NITROSTAT) 0.4 MG SL tablet Place 1 tablet (0.4 mg total) under the tongue every 5 (five) minutes x 3 doses as needed for chest pain. 25 tablet 3   SF 5000 PLUS 1.1 % CREA dental cream See admin instructions.     sildenafil (VIAGRA) 100 MG tablet Take 1 tablet (100 mg total) by mouth daily as needed. 10 tablet 5   Testosterone (ANDROGEL PUMP) 20.25 MG/ACT (1.62%) GEL Place 1 Pump onto the skin daily. 75 g 2   No current facility-administered medications for this visit.     Musculoskeletal: Strength & Muscle Tone: within normal limits Gait & Station: normal Patient leans: N/A  Psychiatric Specialty Exam: Review of Systems  There were no vitals taken for this visit.There is no height or weight on file to calculate BMI.  General Appearance: Well Groomed  Eye Contact:  Good  Speech:  Clear and Coherent  and Normal Rate  Volume:  Normal  Mood:  Anxious and Depressed  Affect:  Appropriate and Congruent  Thought Process:  Coherent, Goal Directed, and Linear  Orientation:  Full (Time, Place, and Person)  Thought Content: WDL and Logical   Suicidal Thoughts:  Yes.  with intent/plan  Homicidal Thoughts:  No  Memory:  Immediate;   Good Recent;   Good Remote;   Good  Judgement:  Good  Insight:  Good  Psychomotor Activity:  Normal  Concentration:  Concentration: Good and Attention Span: Good  Recall:   Good  Fund of Knowledge: Good  Language: Good  Akathisia:  No  Handed:  Right  AIMS (if indicated): not done  Assets:  Communication Skills Desire for Improvement Financial Resources/Insurance Housing Social Support  ADL's:  Intact  Cognition: WNL  Sleep:  Fair   Screenings: [place] Admission (Discharged) from OP Visit from 09/07/2019 in [place] [facility] Admission (Discharged) from OP Visit from 01/12/2019 in [place] [facility] Admission (Discharged) from OP Visit from 08/15/2018 in [place] [facility] Admission (Discharged) from OP Visit from 10/02/2017 in [place] [facility] Admission (Discharged) from 06/22/2017 in [place] [facility]  AIMS Total Score 0 0 0 0 0      AUDIT    Flowsheet Row Office Visit from 08/27/2020 in [facility] Admission (Discharged) from OP Visit from 03/18/2020 in [place] [facility] Admission (Discharged) from 12/10/2019 in [place] [facility] Admission (Discharged) from OP Visit from 09/07/2019 in [place] [facility] Admission (Discharged) from OP Visit from 01/12/2019 in [place] [facility]  Alcohol Use Disorder Identification Test Final Score (AUDIT) $RemoveBefor'27 28 13 25 'mEQVklNZNynv$ 0      GAD-7    Flowsheet Row Clinical Support from 07/07/2021 in [facility] Office Visit from 08/27/2020 in [facility]  Total GAD-7 Score 19 7      PHQ2-9    Flowsheet Row Clinical Support from 07/07/2021 in [facility] Office Visit from 04/24/2021 in [place] Office Visit from 03/05/2021 in [place] Video Visit from 01/20/2021 in [place] Office Visit from 08/29/2020 in [facility]  PHQ-2 Total Score 5 0 0 0 6  PHQ-9 Total Score 22 -- $Rem'3 3 22      'MXSi$ Flowsheet Row Clinical Support from 07/07/2021 in [facility] ED from 01/12/2021 in [facility] ED from 12/14/2020 in [facility] [place] Error: Q6 is Yes, you must answer 7 No Risk Error: Question 6 not populated        Assessment and Plan: Patient endorses symptoms of anxiety, depression, hypersomnia, and suicidal ideation with a plan to withdrawal medical care.  He denies having access to firearms however notes that he does not feel safe with himself and reports that he currently wants to die.  Medications not restarted at this time.  Provider walked patient to [facility] for further evaluation and spoke to clinicians on file about possible admission.  1. MDD (major depressive disorder), recurrent severe, without psychosis ([place])   2. Suicidal ideation   3. Generalized anxiety disorder      Salley Slaughter, NP 07/07/2021, 10:22  AM

## 2021-07-07 NOTE — ED Notes (Signed)
Patient declined to attend wrap up group due to him wanting to sleep, even after encouragement from staff.

## 2021-07-07 NOTE — Progress Notes (Signed)
°   07/07/21 1015  [place] (Walk-ins at [facility] only)  How Did You Hear About [place]? Self  What Is the Reason for Your Visit/Call Today? 61 year old [name] present to [facility] after been referred by Dr. Bradley Ferris. Patient report depressive symptoms present past month and half which includes low energy, increased sleep (report in bed all day), poor diet (only eating junk food) and report not taking anti-depressant medications for the past year. Depressive symptoms triggered by medical issues (pain). Recent had foot surgery and may have to have a 2nd surgery. Report the thought of having to deal with all the pain assoicated with the surgery is making him depressed. Report suicial ideations present 2-3 weeks. Patient is purposely not taking medication heart disease, high blood pressure, type 2 diabetes, and high cholesterol. He reports he hopes to just have an heart attack and pass away. Denied homicidal ideations and denied auditory/visual hallucinations. Patient is widowed wife past away 3 years ago of breast cancel. Two children son lives in [place] and daughter in [place]. Report his children does not know about his mental health struggles. Denied weapons in the home. History of past suicide attempts 1st attempt 2000 (drove car off a bridge) and 2nd attempt in 2005 (attempt overdose). Denied substance use.  How Long Has This Been Causing You Problems? 1 wk - 1 month  Have You Recently Had Any Thoughts About Hurting Yourself? Yes  How long ago did you have thoughts about hurting yourself? Suicidal ideations present past 2-3 weeks  Are You Planning to Commit Suicide/Harm Yourself At This time? Yes (past stopped taking medications in an attempt to have medical complication and pass away.)  Have you Recently Had Thoughts About [place]? No  Are You Planning To Harm Someone At This Time? No  Are you currently experiencing any auditory, visual or other hallucinations? No  Have You Used  Any Alcohol or Drugs in the Past 24 Hours? No  Do you have any current medical co-morbidities that require immediate attention? Yes  Please describe current medical co-morbidities that require immediate attention: heart disease, high blood pressure and high cholesterol.  Clinician description of patient physical appearance/behavior: past dressed appropriately for the weather, wearing all black. He looks down at the floor as he speaks not making eye contact.  What Do You Feel Would Help You the Most Today? Stress Management;Medication(s)  If access to [facility] was not available, would you have sought care in the Emergency Department? Yes  Determination of Need Emergent (2 hours)  Options For Referral Medication Management

## 2021-07-08 DIAGNOSIS — R45851 Suicidal ideations: Secondary | ICD-10-CM | POA: Diagnosis not present

## 2021-07-08 DIAGNOSIS — Z20822 Contact with and (suspected) exposure to covid-19: Secondary | ICD-10-CM | POA: Diagnosis not present

## 2021-07-08 DIAGNOSIS — F411 Generalized anxiety disorder: Secondary | ICD-10-CM | POA: Diagnosis not present

## 2021-07-08 DIAGNOSIS — F332 Major depressive disorder, recurrent severe without psychotic features: Secondary | ICD-10-CM | POA: Diagnosis not present

## 2021-07-08 LAB — GLUCOSE, CAPILLARY: Glucose-Capillary: 158 mg/dL — ABNORMAL HIGH (ref 70–99)

## 2021-07-08 LAB — ETHANOL: Alcohol, Ethyl (B): <10 mg/dL (ref <10)

## 2021-07-08 MED ORDER — BUPROPION HCL ER (XL) 150 MG PO TB24
150.0000 mg | ORAL_TABLET | Freq: Every day | ORAL | Status: DC
  Administered 2021-07-08 – 2021-07-10 (×3): 150 mg via ORAL
  Filled 2021-07-08 (×3): qty 1

## 2021-07-08 MED ORDER — AMLODIPINE BESYLATE 10 MG PO TABS
10.0000 mg | ORAL_TABLET | Freq: Every day | ORAL | Status: DC
  Administered 2021-07-08 – 2021-07-10 (×3): 10 mg via ORAL
  Filled 2021-07-08 (×3): qty 1

## 2021-07-08 MED ORDER — METOPROLOL TARTRATE 50 MG PO TABS
50.0000 mg | ORAL_TABLET | Freq: Two times a day (BID) | ORAL | Status: DC
  Administered 2021-07-08 – 2021-07-09 (×4): 50 mg via ORAL
  Filled 2021-07-08 (×5): qty 1

## 2021-07-08 MED ORDER — METFORMIN HCL 500 MG PO TABS
500.0000 mg | ORAL_TABLET | Freq: Two times a day (BID) | ORAL | Status: DC
  Administered 2021-07-08 – 2021-07-10 (×6): 500 mg via ORAL
  Filled 2021-07-08 (×6): qty 1

## 2021-07-08 MED ORDER — CLOPIDOGREL BISULFATE 75 MG PO TABS
75.0000 mg | ORAL_TABLET | Freq: Every day | ORAL | Status: DC
  Administered 2021-07-08 – 2021-07-10 (×3): 75 mg via ORAL
  Filled 2021-07-08 (×3): qty 1

## 2021-07-08 MED ORDER — ARIPIPRAZOLE 2 MG PO TABS
2.0000 mg | ORAL_TABLET | Freq: Every day | ORAL | Status: DC
  Administered 2021-07-08 – 2021-07-10 (×3): 2 mg via ORAL
  Filled 2021-07-08 (×3): qty 1

## 2021-07-08 MED ORDER — ASPIRIN EC 81 MG PO TBEC
81.0000 mg | DELAYED_RELEASE_TABLET | Freq: Every day | ORAL | Status: DC
  Administered 2021-07-08 – 2021-07-10 (×3): 81 mg via ORAL
  Filled 2021-07-08 (×4): qty 1

## 2021-07-08 NOTE — Clinical Social Work Psych Note (Incomplete)
CSW Update  [name] reports feeling "the same" today. He did report getting "Decent" sleep, however he has not noticed any changes in his mood from yesterday.   [name] continues to be agreeable with a tentative discharge plan of returning home              [name], MSW, [place] Education officer, museum ([place]) [facility]

## 2021-07-08 NOTE — Progress Notes (Signed)
Lab drawn and courier called at 1315.

## 2021-07-08 NOTE — ED Notes (Signed)
Pt sleeping@this time. Breathing even and unlabored. Will continue to monitor for safety 

## 2021-07-08 NOTE — ED Notes (Addendum)
Patient denies [place].  Patient is cooperative and interacts well with staff. Respiratory is even and unlabored. No distress noted. Patient in the dinning room watching TV at present. Will continue to monitor for safety.

## 2021-07-08 NOTE — ED Provider Notes (Signed)
Behavioral Health Progress Note  Date and Time: 07/08/2021 12:31 PM Name: [name] MRN:  [id number]  Subjective:   61 year old male with history of anxiety and depression who presented to the [facility] on 1/9 at the direction of his outpatient provider NP Burt Ek for assessment of suicidal thoughts and worsening depression. Patient had reported that he stopped taking his psychiatric medications (wellbutrin and abilify)  severeal months ago d/t improved mood but that for the last ~1.5 months has been having a worsening mood and stopped taking all of his medications. UDS negative  Patient interviewed in his room this morning.  He is calm, cooperative and pleasant.  Upon entering the room, patient is found lying in bed in no acute distress.  He is agreeable to participate in interview.  Patient states that he presented to the [facility] yesterday due to "experiencing a lot of depression, low energy, sleeping a lot, poor appetite and stopped exercising".  Patient states that he has experienced similar symptoms before" which time he was diagnosed with MDD.  Patient states when he began to feel the same symptoms again this time, he decided to seek help.  Patient states that he has stopped taking his medications and describes experiencing passive SI. No HI/AVH.   Patient states that he did stop taking his psychiatric medications (Wellbutrin and Abilify) "a long time ago" due to improvement in mood.  Patient states that approximately 1.5 months ago he began to notice a decline in his mood.  Patient states that the last 3 years have been difficult for him as he has lost several loved ones including his wife and most recently his brother-in-law.  Patient states that he has been "in and out of the hospital" since this time.  Patient states that he slept well last night although overall his mood is "about the same".  He discusses recent health problems that have been stressful since-particularly difficulty with  his wife.  Patient states that he broke his foot some time ago while mountain biking and that he had to have foot surgery.  Patient states that they placed a pen in his foot and that now they need to remove it.  Patient describes feeling anxious and concerned about the surgery as he is concerned about the pain that may come as a result.  Patient describes difficulty dealing with the pain from the last surgery.  Patient states that he has no family in the immediate area but that he has support through his church and [place] group.  Patient states that he has been sober since June 2 although continues to attend AA groups as he finds them to be helpful.  Patient states that he used to meet with the ministry group once a month although states that he has not done this recently. He states that his children live in [place] and [place] and that they speak frequently.   Patient is agreeable to restarting Wellbutrin and Abilify as he has historically done well on these medications.  Patient denies all physical complaints apart from muscle aches which he describes as chronic.  Patient is agreeable to restart home medication medications of metformin, amlodipine, metoprolol, aspirin.  Obtained from interview and chart review Past Psychiatric History: Previous Medication Trials: mirtazepine, abilify, wellbutrin, zoloft Previous Psychiatric Hospitalizations: yes, was admitted to [facility] on 02/2020; patient reports several inpatient admissions Previous Suicide Attempts: 2000-drive car off a bridge ; 2005-overdose History of Violence: denies Outpatient psychiatrist: Burt Ek NP  Social History: Marital Status:  widowed Children: 2- son lives in [place] and daughter lives in [place] of Income: disability Housing Status: alone History of phys/sexual abuse: no Easy access to gun: no  Substance Use (with emphasis over the last 12 months) Recreational Drugs: denies Use of Alcohol:  sober since June  2 Tobacco Use: no   Legal History: Past Charges/Incarcerations: yes, DUIs in the past Pending charges: denies  Family Psychiatric History: Older sister, brother and mother -depression. Alcoholism "runs in the family" Oldest sister with attempted suicide (at least twice) Uncle with completed suicide [place] grandmother completed suicide   Diagnosis:  Final diagnoses:  Severe episode of recurrent major depressive disorder, without psychotic features ([place])    Total Time spent with patient: 30 minutes  Past Psychiatric History: AUD, in remission; MDD, anxiety Past Medical History:  Past Medical History:  Diagnosis Date   Alcohol dependence ([place])    Allergies    Arthritis    Chest pain    Chronic lower back pain    Chronic pain of right wrist    Coronary artery disease    a. Multiple prior caths/PCI. Cath 2013 with possible spasm of RCA, 70% ISR of mid LCx with subsequent DES to mLCx and prox LCX. b. H/o microvascular angina. c. Recurrent angina 08/2014 - s/p PTCA/DES to prox Cx, PTCA/CBA to OM1.  c. LHC 06/10/15 with patent stents and some ISR in LCX and OM-1 that was not flow limiting --> Rx    Dyslipidemia    a. Intolerant to many statins except tolerating Livalo.   GERD (gastroesophageal reflux disease)    H/O cardiac catheterization 10/25/2018   Heart attack [facility])    Hypertension    Myocardial infarction [facility]) ~ 2010   S/P angioplasty with stent, DES, to proximal and mid LCX 12/15/11 12/15/2011   S/P foot surgery, right 04/2021   Shoulder pain    Stroke ([place])    pt. reports had a stroke around time of MI 2010   Type II diabetes mellitus ([place])    Unstable angina [facility])     Past Surgical History:  Procedure Laterality Date   CARDIAC CATHETERIZATION  06/15/2002   LAD with prox 40% stenosis, norma L main, Cfx with 25% lesion, RCA with long mid 25% stenosis (Dr. Vita Barley)   CARDIAC CATHETERIZATION  04/01/2010   normal L main, LAD wit mild stenosis, L Cfx with 70%  in-stent restenosis, RCA with 70% in-stent restenosis, LVEF >60% (Dr. K. Mali Hilty) - cutting ballon arthrectomy to RCA & Cfx (Dr. Rockne Menghini)   CARDIAC CATHETERIZATION  08/25/2010   preserved global LV contractility; multivessel CAD, diffuse 90-95% in-stent restenosis in prox placed Cfx stent - cutting balloon arthrectomy in Cfx with multiple dilatations 90-95% to 0% stenosis (Dr. Corky Downs)   CARDIAC CATHETERIZATION  01/26/2011   PCI & stenting of aggresive in-stent restenosis within previously stented AV groove Cfx with 3.0x2m Taxus DES (previous stents were Promus) (Dr. JAdora Fridge   CARDIAC CATHETERIZATION  05/11/2011   preserved LV function, 40% mid LAD stenosis, 30-40% narrowing proximal to stented semgnet of prox Cfx, patent mid RCA stent with smooth 20% narrowing in distal RCA (Dr. TCorky Downs   CARDIAC CATHETERIZATION  12/15/2011   PCI & stenting of proximal & mid Cfx with DES - 3.0x140min proximal, 3.0x159mn mid (Dr. J. Adora Fridge CARDelmarA 06/10/2015   Procedure: Left Heart Cath and Coronary Angiography;  Surgeon: DanJolaine ArtistD;  Location: [facility];  Service: Cardiovascular;  Laterality: N/A;   CARDIAC CATHETERIZATION  64/40/3474   cardiometabolic testing  2/59/5638   good exercise effort, peak VO2 79% predicted with normal VO2 HR curves (mild deconditioning)   COLONOSCOPY  12/2012   diminutive hyperplastic sigmoid poyp so repeat routine 2024   CORONARY BALLOON ANGIOPLASTY N/A 10/25/2018   Procedure: CORONARY BALLOON ANGIOPLASTY;  Surgeon: Jettie Booze, MD;  Location: [place] [facility];  Service: Cardiovascular;  Laterality: N/A;   CORONARY BALLOON ANGIOPLASTY N/A 09/29/2019   Procedure: CORONARY BALLOON ANGIOPLASTY;  Surgeon: Nelva Bush, MD;  Location: [place] [facility];  Service: Cardiovascular;  Laterality: N/A;   EXCISIONAL HEMORRHOIDECTOMY  1984   INTRAVASCULAR ULTRASOUND/IVUS N/A 09/29/2019   Procedure: Intravascular Ultrasound/IVUS;   Surgeon: Nelva Bush, MD;  Location: [place] [facility];  Service: Cardiovascular;  Laterality: N/A;   LEFT HEART CATH AND CORONARY ANGIOGRAPHY N/A 10/25/2018   Procedure: LEFT HEART CATH AND CORONARY ANGIOGRAPHY;  Surgeon: Jettie Booze, MD;  Location: [place] [facility];  Service: Cardiovascular;  Laterality: N/A;   LEFT HEART CATH AND CORONARY ANGIOGRAPHY N/A 09/29/2019   Procedure: LEFT HEART CATH AND CORONARY ANGIOGRAPHY;  Surgeon: Nelva Bush, MD;  Location: [place] [facility];  Service: Cardiovascular;  Laterality: N/A;   LEFT HEART CATHETERIZATION WITH CORONARY ANGIOGRAM N/A 05/11/2011   Procedure: LEFT HEART CATHETERIZATION WITH CORONARY ANGIOGRAM;  Surgeon: Troy Sine, MD;  Location: [facility] CATH LAB;  Service: Cardiovascular;  Laterality: N/A;  Possible percutaneous coronary intervention, possible IVUS   LEFT HEART CATHETERIZATION WITH CORONARY ANGIOGRAM N/A 12/15/2011   Procedure: LEFT HEART CATHETERIZATION WITH CORONARY ANGIOGRAM;  Surgeon: Lorretta Harp, MD;  Location: [facility] CATH LAB;  Service: Cardiovascular;  Laterality: N/A;   LEFT HEART CATHETERIZATION WITH CORONARY ANGIOGRAM N/A 09/05/2014   Procedure: LEFT HEART CATHETERIZATION WITH CORONARY ANGIOGRAM;  Surgeon: Burnell Blanks, MD;  Location: [facility] CATH LAB;  Service: Cardiovascular;  Laterality: N/A;   LIPOMA EXCISION     back of the head   NM MYOCAR PERF WALL MOTION  02/2012   lexiscan myoview; mild perfusion defect in mid inferolateral & basal inferolateral region (infarct/scar); EF 52%, abnormal but ow risk scan   PERCUTANEOUS CORONARY STENT INTERVENTION (PCI-S)  09/05/2014   Procedure: PERCUTANEOUS CORONARY STENT INTERVENTION (PCI-S);  Surgeon: Burnell Blanks, MD;  Location: [facility] CATH LAB;  Service: Cardiovascular;;   TONSILLECTOMY     Family History:  Family History  Problem Relation Age of Onset   Leukemia Mother    Prostate cancer Father    Cancer Brother    Coronary artery disease Paternal  Grandmother    Cancer Paternal Grandfather    Migraines Neg Hx    Headache Neg Hx    Family Psychiatric  History:  Older sister, brother and mother -depression. Alcoholism "runs in the family" Oldest sister with attempted suicide (at least twice) Uncle with completed suicide [place] grandmother completed suicide Social History:  Social History   Substance and Sexual Activity  Alcohol Use Not Currently   Comment: 2 bottles of wine on 12/08/2019 (one occurrence)     Social History   Substance and Sexual Activity  Drug Use Not Currently   Types: Cocaine   Comment: last use several months ago    Social History   Socioeconomic History   Marital status: Widowed    Spouse name: Not on file   Number of children: 2   Years of education: GED   Highest education level: Not on file  Occupational History   Not on file  Tobacco Use   Smoking status: Former    Packs/day: 1.00    Years: 10.00    Pack years: 10.00    Types: Cigarettes    Quit date: 10/07/2018    Years since quitting: 2.7   Smokeless tobacco: Never  Vaping Use   Vaping Use: Never used  Substance and Sexual Activity   Alcohol use: Not Currently    Comment: 2 bottles of wine on 12/08/2019 (one occurrence)   Drug use: Not Currently    Types: Cocaine    Comment: last use several months ago   Sexual activity: Not Currently  Other Topics Concern   Not on file  Social History Narrative   ** Merged History Encounter **       Social Determinants of Health   Financial Resource Strain: Not on file  Food Insecurity: Not on file  Transportation Needs: Not on file  Physical Activity: Not on file  Stress: Not on file  Social Connections: Not on file   SDOH:  SDOH Screenings   Alcohol Screen: Medium Risk   Last Alcohol Screening Score (AUDIT): 27  Depression (PHQ2-9): Medium Risk   PHQ-2 Score: 22  Financial Resource Strain: Not on file  Food Insecurity: Not on file  Housing: Not on file  Physical Activity:  Not on file  Social Connections: Not on file  Stress: Not on file  Tobacco Use: Medium Risk   Smoking Tobacco Use: Former   Smokeless Tobacco Use: Never   Passive Exposure: Not on file  Transportation Needs: Not on file   Additional Social History:    Pain Medications: See MAR Prescriptions: See MAR Over the Counter: See MAR History of alcohol / drug use?: Yes Longest period of sobriety (when/how long): 4 years and 1 month Negative Consequences of Use: Financial, Personal relationships Withdrawal Symptoms: Blackouts                    Sleep: Fair  Appetite:  Poor  Current Medications:  Current Facility-Administered Medications  Medication Dose Route Frequency Provider Last Rate Last Admin   acetaminophen (TYLENOL) tablet 650 mg  650 mg Oral Q6H PRN Derrill Center, NP       alum & mag hydroxide-simeth (MAALOX/MYLANTA) 200-200-20 MG/5ML suspension 30 mL  30 mL Oral Q4H PRN Derrill Center, NP       amLODipine (NORVASC) tablet 10 mg  10 mg Oral Daily Ival Bible, MD   10 mg at 07/08/21 1019   ARIPiprazole (ABILIFY) tablet 2 mg  2 mg Oral Daily Ival Bible, MD       aspirin EC tablet 81 mg  81 mg Oral Daily Ival Bible, MD   81 mg at 07/08/21 1019   buPROPion (WELLBUTRIN XL) 24 hr tablet 150 mg  150 mg Oral Daily Ival Bible, MD       clopidogrel (PLAVIX) tablet 75 mg  75 mg Oral Daily Ival Bible, MD   75 mg at 07/08/21 1019   hydrOXYzine (ATARAX) tablet 25 mg  25 mg Oral TID PRN Derrill Center, NP       magnesium hydroxide (MILK OF MAGNESIA) suspension 30 mL  30 mL Oral Daily PRN Derrill Center, NP   30 mL at 07/08/21 1001   metFORMIN (GLUCOPHAGE) tablet 500 mg  500 mg Oral BID WC Ival Bible, MD   500 mg at 07/08/21 1019   metoprolol tartrate (  LOPRESSOR) tablet 50 mg  50 mg Oral BID Ival Bible, MD   50 mg at 07/08/21 1019   traZODone (DESYREL) tablet 50 mg  50 mg Oral QHS PRN Derrill Center, NP   50 mg at  07/07/21 2156   Current Outpatient Medications  Medication Sig Dispense Refill   Accu-Chek Softclix Lancets lancets SMARTSIG:1 Topical 4 Times Daily     amLODipine (NORVASC) 10 MG tablet Take 1 tablet by mouth once daily (Patient taking differently: Take 10 mg by mouth daily.) 90 tablet 0   aspirin EC 81 MG EC tablet Take 1 tablet (81 mg total) by mouth daily. (May buy from over the counter): Swallow whole for heart health 30 tablet 0   blood glucose meter kit and supplies KIT Dispense based on patient and insurance preference. Use up to four times daily as directed. (FOR ICD-9 250.00, 250.01). 1 each 0   Blood Pressure Monitor KIT 1 kit by Does not apply route daily. 1 kit 0   clopidogrel (PLAVIX) 75 MG tablet Take 1 tablet (75 mg total) by mouth daily. 90 tablet 1   EPINEPHrine 0.3 mg/0.3 mL IJ SOAJ injection Inject 0.3 mg into the muscle as needed for anaphylaxis. 1 each 2   glucose blood (ACCU-CHEK GUIDE) test strip Use as instructed 300 each 3   LIVALO 4 MG TABS Take 1 tablet (4 mg total) by mouth daily. (Patient taking differently: Take 4 mg by mouth daily.) 90 tablet 3   metFORMIN (GLUCOPHAGE) 500 MG tablet TAKE 1 TABLET BY MOUTH TWICE DAILY WITH A MEAL (Patient taking differently: Take 500 mg by mouth 2 (two) times daily with a meal.) 180 tablet 2   metoprolol tartrate (LOPRESSOR) 100 MG tablet Take 0.5 tablets (50 mg total) by mouth 2 (two) times daily. 180 tablet 3   nitroGLYCERIN (NITROSTAT) 0.4 MG SL tablet Place 1 tablet (0.4 mg total) under the tongue every 5 (five) minutes x 3 doses as needed for chest pain. 25 tablet 3   Omega-3 Fatty Acids (FISH OIL PO) Take 1 capsule by mouth daily.     SF 5000 PLUS 1.1 % CREA dental cream Place 1 application onto teeth at bedtime as needed (For teeth sensitivity).     sildenafil (VIAGRA) 100 MG tablet Take 1 tablet (100 mg total) by mouth daily as needed. (Patient taking differently: Take 100 mg by mouth daily as needed for erectile dysfunction.)  10 tablet 5   Testosterone (ANDROGEL PUMP) 20.25 MG/ACT (1.62%) GEL Place 1 Pump onto the skin daily. 75 g 2    Labs  Lab Results:  Admission on 07/07/2021  Component Date Value Ref Range Status   SARS Coronavirus 2 by RT PCR 07/07/2021 NEGATIVE  NEGATIVE Final   Comment: (NOTE) SARS-CoV-2 target nucleic acids are NOT DETECTED.  The SARS-CoV-2 RNA is generally detectable in upper respiratory specimens during the acute phase of infection. The lowest concentration of SARS-CoV-2 viral copies this assay can detect is 138 copies/mL. A negative result does not preclude SARS-Cov-2 infection and should not be used as the sole basis for treatment or other patient management decisions. A negative result may occur with  improper specimen collection/handling, submission of specimen other than nasopharyngeal swab, presence of viral mutation(s) within the areas targeted by this assay, and inadequate number of viral copies(<138 copies/mL). A negative result must be combined with clinical observations, patient history, and epidemiological information. The expected result is Negative.  Fact Sheet for Patients:  EntrepreneurPulse.com.au  Fact  Sheet for Healthcare Providers:  IncredibleEmployment.be  This test is no                          t yet approved or cleared by the [place] FDA and  has been authorized for detection and/or diagnosis of SARS-CoV-2 by FDA under an Emergency Use Authorization (EUA). This EUA will remain  in effect (meaning this test can be used) for the duration of the COVID-19 declaration under Section 564(b)(1) of the Act, 21 U.S.C.section 360bbb-3(b)(1), unless the authorization is terminated  or revoked sooner.       Influenza A by PCR 07/07/2021 NEGATIVE  NEGATIVE Final   Influenza B by PCR 07/07/2021 NEGATIVE  NEGATIVE Final   Comment: (NOTE) The Xpert Xpress SARS-CoV-2/FLU/RSV plus assay is intended as an aid in the  diagnosis of influenza from Nasopharyngeal swab specimens and should not be used as a sole basis for treatment. Nasal washings and aspirates are unacceptable for Xpert Xpress SARS-CoV-2/FLU/RSV testing.  Fact Sheet for Patients: EntrepreneurPulse.com.au  Fact Sheet for Healthcare Providers: IncredibleEmployment.be  This test is not yet approved or cleared by the [place] FDA and has been authorized for detection and/or diagnosis of SARS-CoV-2 by FDA under an Emergency Use Authorization (EUA). This EUA will remain in effect (meaning this test can be used) for the duration of the COVID-19 declaration under Section 564(b)(1) of the Act, 21 U.S.C. section 360bbb-3(b)(1), unless the authorization is terminated or revoked.  Performed at [facility], [place] [address] [place], [place] [address]    WBC 07/07/2021 4.8  4.0 - 10.5 K/uL Final   RBC 07/07/2021 5.00  4.22 - 5.81 MIL/uL Final   Hemoglobin 07/07/2021 13.9  13.0 - 17.0 g/dL Final   HCT 07/07/2021 41.7  39.0 - 52.0 % Final   MCV 07/07/2021 83.4  80.0 - 100.0 fL Final   MCH 07/07/2021 27.8  26.0 - 34.0 pg Final   MCHC 07/07/2021 33.3  30.0 - 36.0 g/dL Final   RDW 07/07/2021 12.3  11.5 - 15.5 % Final   Platelets 07/07/2021 229  150 - 400 K/uL Final   nRBC 07/07/2021 0.0  0.0 - 0.2 % Final   Neutrophils Relative % 07/07/2021 46  % Final   Neutro Abs 07/07/2021 2.3  1.7 - 7.7 K/uL Final   Lymphocytes Relative 07/07/2021 38  % Final   Lymphs Abs 07/07/2021 1.9  0.7 - 4.0 K/uL Final   Monocytes Relative 07/07/2021 9  % Final   Monocytes Absolute 07/07/2021 0.4  0.1 - 1.0 K/uL Final   Eosinophils Relative 07/07/2021 5  % Final   Eosinophils Absolute 07/07/2021 0.2  0.0 - 0.5 K/uL Final   Basophils Relative 07/07/2021 1  % Final   Basophils Absolute 07/07/2021 0.1  0.0 - 0.1 K/uL Final   Immature Granulocytes 07/07/2021 1  % Final   Abs Immature Granulocytes 07/07/2021 0.03  0.00 - 0.07  K/uL Final   Performed at [facility], [place] [address] [place], [place] [address]   Sodium 07/07/2021 136  135 - 145 mmol/L Final   Potassium 07/07/2021 4.3  3.5 - 5.1 mmol/L Final   Chloride 07/07/2021 104  98 - 111 mmol/L Final   CO2 07/07/2021 24  22 - 32 mmol/L Final   Glucose, Bld 07/07/2021 144 (H)  70 - 99 mg/dL Final   Glucose reference range applies only to samples taken after fasting for at least 8 hours.  BUN 07/07/2021 15  6 - 20 mg/dL Final   Creatinine, Ser 07/07/2021 0.92  0.61 - 1.24 mg/dL Final   Calcium 07/07/2021 9.2  8.9 - 10.3 mg/dL Final   Total Protein 07/07/2021 6.6  6.5 - 8.1 g/dL Final   Albumin 07/07/2021 4.3  3.5 - 5.0 g/dL Final   AST 07/07/2021 24  15 - 41 U/L Final   ALT 07/07/2021 41  0 - 44 U/L Final   Alkaline Phosphatase 07/07/2021 51  38 - 126 U/L Final   Total Bilirubin 07/07/2021 0.7  0.3 - 1.2 mg/dL Final   GFR, Estimated 07/07/2021 >60  >60 mL/min Final   Comment: (NOTE) Calculated using the CKD-EPI Creatinine Equation (2021)    Anion gap 07/07/2021 8  5 - 15 Final   Performed at [facility], [place] [address] [place], [place] [address]   TSH 07/07/2021 3.372  0.350 - 4.500 uIU/mL Final   Comment: Performed by a 3rd Generation assay with a functional sensitivity of <=0.01 uIU/mL. Performed at [facility], [place] [address], [place],  [address]    Cholesterol 07/07/2021 144  0 - 200 mg/dL Final   Triglycerides 07/07/2021 45  <150 mg/dL Final   HDL 07/07/2021 44  >40 mg/dL Final   Total CHOL/HDL Ratio 07/07/2021 3.3  RATIO Final   VLDL 07/07/2021 9  0 - 40 mg/dL Final   LDL Cholesterol 07/07/2021 91  0 - 99 mg/dL Final   Comment:        Total Cholesterol/HDL:CHD Risk Coronary Heart Disease Risk Table                     Men   Women  1/2 Average Risk   3.4   3.3  Average Risk       5.0   4.4  2 X Average Risk   9.6   7.1  3 X Average Risk  23.4   11.0        Use the calculated Patient Ratio above and the CHD Risk  Table to determine the patient's CHD Risk.        ATP III CLASSIFICATION (LDL):  <100     mg/dL   Optimal  100-129  mg/dL   Near or Above                    Optimal  130-159  mg/dL   Borderline  160-189  mg/dL   High  >190     mg/dL   Very High Performed at [place] [address] [place], [place] [address]    POC Amphetamine UR 07/07/2021 None Detected  NONE DETECTED (Cut Off Level 1000 ng/mL) Preliminary   POC Secobarbital (BAR) 07/07/2021 None Detected  NONE DETECTED (Cut Off Level 300 ng/mL) Preliminary   POC Buprenorphine (BUP) 07/07/2021 None Detected  NONE DETECTED (Cut Off Level 10 ng/mL) Preliminary   POC Oxazepam (BZO) 07/07/2021 None Detected  NONE DETECTED (Cut Off Level 300 ng/mL) Preliminary   POC Cocaine UR 07/07/2021 None Detected  NONE DETECTED (Cut Off Level 300 ng/mL) Preliminary   POC Methamphetamine UR 07/07/2021 None Detected  NONE DETECTED (Cut Off Level 1000 ng/mL) Preliminary   POC Morphine 07/07/2021 None Detected  NONE DETECTED (Cut Off Level 300 ng/mL) Preliminary   POC Oxycodone UR 07/07/2021 None Detected  NONE DETECTED (Cut Off Level 100 ng/mL) Preliminary   POC Methadone UR 07/07/2021 None Detected  NONE DETECTED (Cut Off  Level 300 ng/mL) Preliminary   POC Marijuana UR 07/07/2021 None Detected  NONE DETECTED (Cut Off Level 50 ng/mL) Preliminary  Clinical Support on 07/07/2021  Component Date Value Ref Range Status   SARSCOV2ONAVIRUS 2 AG 07/07/2021 NEGATIVE  NEGATIVE Final   Comment: (NOTE) SARS-CoV-2 antigen NOT DETECTED.   Negative results are presumptive.  Negative results do not preclude SARS-CoV-2 infection and should not be used as the sole basis for treatment or other patient management decisions, including infection  control decisions, particularly in the presence of clinical signs and  symptoms consistent with COVID-19, or in those who have been in contact with the virus.  Negative results must be combined with clinical  observations, patient history, and epidemiological information. The expected result is Negative.  Fact Sheet for Patients: HandmadeRecipes.com.cy  Fact Sheet for Healthcare Providers: FuneralLife.at  This test is not yet approved or cleared by the [place] FDA and  has been authorized for detection and/or diagnosis of SARS-CoV-2 by FDA under an Emergency Use Authorization (EUA).  This EUA will remain in effect (meaning this test can be used) for the duration of  the COV                          ID-19 declaration under Section 564(b)(1) of the Act, 21 U.S.C. section 360bbb-3(b)(1), unless the authorization is terminated or revoked sooner.    Office Visit on 04/24/2021  Component Date Value Ref Range Status   Testosterone 04/24/2021 327  264 - 916 ng/dL Final   Comment: Adult male reference interval is based on a population of healthy nonobese males (BMI <30) between 49 and 15 years old. [place], [place] [phone]. PMID: [id number].   Office Visit on 03/05/2021  Component Date Value Ref Range Status   Color, UA 03/05/2021 yellow  yellow Final   Clarity, UA 03/05/2021 clear  clear Final   Glucose, UA 03/05/2021 negative  negative mg/dL Final   Bilirubin, UA 03/05/2021 negative  negative Final   Ketones, POC UA 03/05/2021 negative  negative mg/dL Final   Spec Grav, UA 03/05/2021 1.020  1.010 - 1.025 Final   Blood, UA 03/05/2021 negative  negative Final   pH, UA 03/05/2021 6.0  5.0 - 8.0 Final   POC PROTEIN,UA 03/05/2021 negative  negative, trace Final   Urobilinogen, UA 03/05/2021 0.2  0.2 or 1.0 E.U./dL Final   Nitrite, UA 03/05/2021 Negative  Negative Final   Leukocytes, UA 03/05/2021 Negative  Negative Final   Hemoglobin A1C 03/05/2021 5.0  4.0 - 5.6 % Final   HbA1c POC (<> result, manual entry) 03/05/2021 5.0  4.0 - 5.6 % Final   HbA1c, POC (prediabetic range) 03/05/2021 5.0 (A)  5.7 - 6.4 % Final   HbA1c, POC  (controlled diabetic ra* 03/05/2021 5.0  0.0 - 7.0 % Final   POC Glucose 03/05/2021 122 (A)  70 - 99 mg/dl Final   Cholesterol, Total 03/05/2021 152  100 - 199 mg/dL Final   Triglycerides 03/05/2021 99  0 - 149 mg/dL Final   HDL 03/05/2021 44  >39 mg/dL Final   VLDL Cholesterol Cal 03/05/2021 18  5 - 40 mg/dL Final   LDL Chol Calc (NIH) 03/05/2021 90  0 - 99 mg/dL Final   Chol/HDL Ratio 03/05/2021 3.5  0.0 - 5.0 ratio Final   Comment:  T. Chol/HDL Ratio                                             Men  Women                               1/2 Avg.Risk  3.4    3.3                                   Avg.Risk  5.0    4.4                                2X Avg.Risk  9.6    7.1                                3X Avg.Risk 23.4   11.0    Glucose 03/05/2021 103 (H)  65 - 99 mg/dL Final   BUN 03/05/2021 13  6 - 24 mg/dL Final   Creatinine, Ser 03/05/2021 0.92  0.76 - 1.27 mg/dL Final   eGFR 03/05/2021 96  >59 mL/min/1.73 Final   BUN/Creatinine Ratio 03/05/2021 14  9 - 20 Final   Sodium 03/05/2021 142  134 - 144 mmol/L Final   Potassium 03/05/2021 4.8  3.5 - 5.2 mmol/L Final   Chloride 03/05/2021 103  96 - 106 mmol/L Final   Calcium 03/05/2021 9.7  8.7 - 10.2 mg/dL Final   Total Protein 03/05/2021 7.2  6.0 - 8.5 g/dL Final   Albumin 03/05/2021 5.0 (H)  3.8 - 4.9 g/dL Final   Globulin, Total 03/05/2021 2.2  1.5 - 4.5 g/dL Final   Albumin/Globulin Ratio 03/05/2021 2.3 (H)  1.2 - 2.2 Final   Bilirubin Total 03/05/2021 0.4  0.0 - 1.2 mg/dL Final   Alkaline Phosphatase 03/05/2021 64  44 - 121 IU/L Final   AST 03/05/2021 24  0 - 40 IU/L Final  Office Visit on 02/12/2021  Component Date Value Ref Range Status   Glucose, UA 02/12/2021 Negative  Negative Final   Bilirubin, UA 02/12/2021 neg   Final   Ketones, UA 02/12/2021 neg   Final   Spec Grav, UA 02/12/2021 <=1.005 (A)  1.010 - 1.025 Final   Blood, UA 02/12/2021 neg   Final   pH, UA 02/12/2021 6.5  5.0 - 8.0 Final    Protein, UA 02/12/2021 Negative  Negative Final   Urobilinogen, UA 02/12/2021 0.2  0.2 or 1.0 E.U./dL Final   Nitrite, UA 02/12/2021 neg   Final   Leukocytes, UA 02/12/2021 Negative  Negative Final   Hemoglobin A1C 02/12/2021 6.2 (A)  4.0 - 5.6 % Final  Orders Only on 01/21/2021  Component Date Value Ref Range Status   Vit D, 25-Hydroxy 01/21/2021 44.9  30.0 - 100.0 ng/mL Final   Comment: Vitamin D deficiency has been defined by the [place] practice guideline as a level of serum 25-OH vitamin D less than 20 ng/mL (1,2). The Endocrine Society went on to further define vitamin D insufficiency as a level between 21 and 29 ng/mL (2). 1. IOM (Institute of Medicine). 2010. Dietary reference  intakes for calcium and D. [place]: The    [employer]. 2. Holick MF, Binkley Satanta, Bischoff-Ferrari HA, et al.    Evaluation, treatment, and prevention of vitamin D    deficiency: an Endocrine Society clinical practice    guideline. JCEM. 2011 Jul; 96(7):1911-30.    Testosterone 01/21/2021 140 (L)  264 - 916 ng/dL Final   Comment: Adult male reference interval is based on a population of healthy nonobese males (BMI <30) between 57 and 33 years old. [place], [place] [id number]. PMID: [id number].    Vitamin B-12 01/21/2021 537  232 - 1,245 pg/mL Final   Magnesium 01/21/2021 1.9  1.6 - 2.3 mg/dL Final    Blood Alcohol level:  Lab Results  Component Value Date   ETH 18 (H) 03/17/2020   ETH <10 62/08/5595    Metabolic Disorder Labs: Lab Results  Component Value Date   HGBA1C 5.0 03/05/2021   HGBA1C 5.0 03/05/2021   HGBA1C 5.0 (A) 03/05/2021   HGBA1C 5.0 03/05/2021   MPG 136.98 03/16/2020   MPG 151.33 12/12/2019   Lab Results  Component Value Date   PROLACTIN 13.0 03/17/2020   Lab Results  Component Value Date   CHOL 144 07/07/2021   TRIG 45 07/07/2021   HDL 44 07/07/2021   CHOLHDL 3.3 07/07/2021   VLDL 9  07/07/2021   LDLCALC 91 07/07/2021   LDLCALC 90 03/05/2021    Therapeutic Lab Levels: No results found for: LITHIUM No results found for: VALPROATE No components found for:  CBMZ  Physical Findings   AIMS    Flowsheet Row Admission (Discharged) from OP Visit from 09/07/2019 in [place] [facility] Admission (Discharged) from OP Visit from 01/12/2019 in [place] [facility] Admission (Discharged) from OP Visit from 08/15/2018 in [place] [facility] Admission (Discharged) from OP Visit from 10/02/2017 in [place] [facility] Admission (Discharged) from 06/22/2017 in [place] [facility]  AIMS Total Score 0 0 0 0 0      AUDIT    Flowsheet Row Office Visit from 08/27/2020 in [facility] Admission (Discharged) from OP Visit from 03/18/2020 in [place] [facility] Admission (Discharged) from 12/10/2019 in [place] [facility] Admission (Discharged) from OP Visit from 09/07/2019 in [place] [facility] Admission (Discharged) from OP Visit from 01/12/2019 in [place] [facility]  Alcohol Use Disorder Identification Test Final Score (AUDIT) 27 28 13 25  0      GAD-7    Flowsheet Row Clinical Support from 07/07/2021 in [facility] Office Visit from 08/27/2020 in [facility]  Total GAD-7 Score 19 7      PHQ2-9    Flowsheet Row Clinical Support from 07/07/2021 in [facility] Office Visit from 04/24/2021 in [place] Office Visit from 03/05/2021 in [place] Video Visit from 01/20/2021 in [place] Office Visit from 08/29/2020 in [facility]  PHQ-2 Total Score 5 0 0 0 6  PHQ-9 Total  Score 22 -- 3 3 22       Flowsheet Row ED from 07/07/2021 in [facility] Most recent reading at 07/07/2021  2:29 PM Clinical Support from 07/07/2021 in [facility] Most recent reading at 07/07/2021  9:57 AM ED from 01/12/2021  in [facility] Most recent reading at 01/12/2021 12:18 PM  C-SSRS RISK CATEGORY Low Risk Error: Q6 is Yes, you must answer 7 No Risk        Musculoskeletal  Strength & Muscle Tone: within normal limits Gait & Station: normal Patient leans: N/A  Psychiatric Specialty Exam  Presentation  General Appearance: Appropriate for Environment; Casual  Eye Contact:Fair  Speech:Clear and Coherent; Normal Rate  Speech Volume:Decreased  Handedness:Right   Mood and Affect  Mood:Dysphoric; Anxious; Depressed ("the same")  Affect:Appropriate; Congruent   Thought Process  Thought Processes:Coherent; Goal Directed; Linear  Descriptions of Associations:Intact  Orientation:Full (Time, Place and Person)  Thought Content:WDL; Logical     Hallucinations:Hallucinations: None  Ideas of Reference:None  Suicidal Thoughts:Suicidal Thoughts: Yes, Passive SI Passive Intent and/or Plan: Without Intent  Homicidal Thoughts:Homicidal Thoughts: No   Sensorium  Memory:Immediate Good; Recent Good; Remote Good  Judgment:Fair  Insight:Fair   Executive Functions  Concentration:Good  Attention Span:Good  Recall:Good  Fund of Knowledge:Good  Language:Good   Psychomotor Activity  Psychomotor Activity:Psychomotor Activity: Normal   Assets  Assets:Communication Skills; Desire for Improvement; Social Support; Resilience; Housing   Sleep  Sleep:Sleep: Fair   Nutritional Assessment (For OBS and [facility]BC admissions only) Has the patient had a weight loss or gain of 10 pounds or more in the last 3 months?: No Has the patient had a decrease in food intake/or appetite?: No Does the patient  have dental problems?: No Does the patient have eating habits or behaviors that may be indicators of an eating disorder including binging or inducing vomiting?: No Has the patient recently lost weight without trying?: 0 Has the patient been eating poorly because of a decreased appetite?: 0 Malnutrition Screening Tool Score: 0    Physical Exam  Physical Exam Constitutional:      Appearance: Normal appearance. He is normal weight.  HENT:     Head: Normocephalic and atraumatic.  Eyes:     Extraocular Movements: Extraocular movements intact.  Cardiovascular:     Rate and Rhythm: Normal rate and regular rhythm.     Heart sounds: Normal heart sounds.  Pulmonary:     Effort: Pulmonary effort is normal.     Breath sounds: Normal breath sounds.  Abdominal:     General: Bowel sounds are normal.     Palpations: Abdomen is soft.  Neurological:     General: No focal deficit present.     Mental Status: He is alert and oriented to person, place, and time.  Psychiatric:        Attention and Perception: Attention and perception normal.        Speech: Speech normal.        Behavior: Behavior normal. Behavior is cooperative.        Thought Content: Thought content normal.   Review of Systems  Constitutional:  Negative for chills and fever.  HENT:  Negative for hearing loss.   Eyes:  Negative for discharge and redness.  Respiratory:  Negative for cough.   Cardiovascular:  Negative for chest pain.  Gastrointestinal:  Negative for abdominal pain.  Musculoskeletal:  Positive for myalgias.       Muscles aches (chronic)  Neurological:  Negative for headaches.  Psychiatric/Behavioral:  Positive for depression. Negative for hallucinations and substance abuse.   Blood pressure (!) 126/91, pulse 79, temperature (!) 97.1 F (36.2 C), temperature source Temporal, resp. rate 18, SpO2 100 %. There is no height or weight on file to calculate  BMI.  Treatment Plan Summary: 61 year old male with history  of anxiety and depression who presented to the [facility] on 1/9 at the direction of his outpatient provider NP Burt Ek for assessment of suicidal thoughts and worsening depression. Patient had reported that he stopped taking his psychiatric medications (wellbutrin and abilify)  severeal months ago d/t improved mood but that for the last ~1.5 months has been having a worsening mood and stopped taking all of his medications. UDS negative  Patient with ongoing low mood- is amenable to restarted wellbutrin and abilify which he has he has historically responded well to. Will initiate wellbutrin 150 mg XL daily and abilify 2 mg daily (previously on 5 mg).   Will add ethanol as an add on for completeness of labs. Patient reports sobriety since June.  MDD -restart wellbutrin 150 mg XL -re-start abilify 2 mg daily. Can consider increase if tolerates well-historicalyl did well on 5 mg.   T2DM -continue metformin 500 mg   CAD -continue home ASA 81 mg -continue home plavix 75 mg  Hypertension -continue home metoprolol 100 mg daily -continue home norvasc 10 mg   Dispo: ongoing. Pending symptomatic improvement. Likely dc to home with f/u with Burt Ek. Could consider IOP/PHP if patient agreeable   Ival Bible, MD 07/08/2021 12:31 PM

## 2021-07-08 NOTE — Progress Notes (Signed)
Patient appears to be sleeping.  Respirations even and unlabored.  Continue to monitor for safety.

## 2021-07-08 NOTE — ED Notes (Signed)
Pt stated he feels like his blood sugar is high. Blood sugar was checked by nurse and provider Margorie John (NP) notified about result. Will continue to monitor for safety

## 2021-07-08 NOTE — ED Notes (Signed)
Pt asleep in bed. Respirations even and unlabored. Will continue to monitor for safety. ?

## 2021-07-08 NOTE — ED Notes (Signed)
Patient in dining room for breakfast.  Medication compliant.  Denied SI, HI, AVH.  Reporting "constipation" and last BM Sunday. Requesting and given Milk of Mag.  Continue to monitor for safety.

## 2021-07-08 NOTE — ED Notes (Addendum)
Provider Margorie John NP stated pt can take his metformin as ordered.

## 2021-07-08 NOTE — Progress Notes (Signed)
Patient in dining room for lunch.

## 2021-07-09 DIAGNOSIS — F332 Major depressive disorder, recurrent severe without psychotic features: Secondary | ICD-10-CM | POA: Diagnosis not present

## 2021-07-09 DIAGNOSIS — F411 Generalized anxiety disorder: Secondary | ICD-10-CM | POA: Diagnosis not present

## 2021-07-09 DIAGNOSIS — R45851 Suicidal ideations: Secondary | ICD-10-CM | POA: Diagnosis not present

## 2021-07-09 DIAGNOSIS — Z20822 Contact with and (suspected) exposure to covid-19: Secondary | ICD-10-CM | POA: Diagnosis not present

## 2021-07-09 LAB — GLUCOSE, CAPILLARY
Glucose-Capillary: 280 mg/dL — ABNORMAL HIGH (ref 70–99)
Glucose-Capillary: 176 mg/dL — ABNORMAL HIGH (ref 70–99)

## 2021-07-09 MED ORDER — TRAZODONE HCL 50 MG PO TABS
50.0000 mg | ORAL_TABLET | Freq: Every day | ORAL | Status: DC
  Administered 2021-07-09: 50 mg via ORAL
  Filled 2021-07-09: qty 1

## 2021-07-09 NOTE — ED Notes (Signed)
Pts medications have been given and patient is now in bed.

## 2021-07-09 NOTE — Progress Notes (Signed)
Staff from dietary saw pt and asked about his food preference. Pt informed her that he is vegan/vegetarian. Pt said he will prefer fruits and vegetables. Pt is currently eating spaghetti with meat sauce. No complaints at this time. Will continue to monitor.

## 2021-07-09 NOTE — ED Notes (Signed)
Pt is in the bed sleeping. Respirations are even and unlabored. No acute distress noted. Will continue to monitor for safety. 

## 2021-07-09 NOTE — Progress Notes (Signed)
Pt reported tingling fingers with onset yesterday afternoon due to high sugar intake. Checked his blood sugar. CBG was 280 . MD notified via secure chat. Will continue to monitor.

## 2021-07-09 NOTE — ED Notes (Addendum)
Pt in his room calm and cooperative. No c/o pain or distress. Will continue to monitor or safety

## 2021-07-09 NOTE — Clinical Social Work Psych Note (Signed)
CSW Update   Jeshawn reports feeling "pretty good" today. Garritt shared that he did not receive any sleep last night, until he requested medications for sleep aid. Royce reports he eventually fell asleep at 4:00am.   Miguel also shared complaints for the food options on the [facility]. Carry is diabetic and reports that he has consumed food with "a lot of sugar" and as a result his feet and hands are tingling. CSW ensured Dellis Filbert that providers and RN staff will be made aware to address this issues.   Damond expressed forward thinking by sharing his plans following discharge. Tildon reports that he is very excited to get home and "clean!". Leilan continues to be agreeable with his tentative discharge plans.    CSW will continue to follow for any additional questions, concerns or needs identified.    Radonna Ricker, MSW, LCSW Clinical Education officer, museum ([place]) [facility]

## 2021-07-09 NOTE — ED Notes (Signed)
Medication given as ordered by provider Margorie John  NP)

## 2021-07-09 NOTE — ED Notes (Signed)
Pt is currently sleeping, no distress noted, environmental check complete, will continue to monitor patient for safety. ? ?

## 2021-07-09 NOTE — Clinical Social Work Psych Note (Signed)
Cognitive Distortions & Restructuring  Cognitive Distortions & Cognitive Restructuring (CBT & DBT Skills)  Date: 07/09/21  Type of Therapy/Therapeutic Modalities:  Participation Level: Active  Objective: The purpose of this group is to discuss and assist patients in identifying cognitive distortions (negative thinking patterns) that can influence their emotions and behaviors. Facilitators will guide conversations that discuss how these cognitive distortions contribute to common disorders such as anxiety and depression.   Therapeutic Goals:  Patient will identify negative thinking patterns they currently experience and how those thoughts influence their behaviors.  Patient will begin to explore the possible misinformation and/or traumatic experiences that influence their negative thought patterns.  Patient will explore the foundations and techniques of cognitive restructuring by reviewing CBT and DBT techniques.  Patient will discuss the   Summary of Patients Progress:  Furman was engaged and participated throughout the group's session. Correll shared how he didn't realize cognitive distortions contributed to his previous history of alcohol abuse and depression. Zhaire reports that being able to identify distorted thinking, could prevent him from a future relapse and mood dysregulation.

## 2021-07-09 NOTE — ED Notes (Signed)
Patient attended group starting at 11am to 12pm  the Title is How We Grow, it covered Self Esteem, self care tips, Marslow's Hierarchy of Needs, Values and Goal Setting. Patient was very active and engaging in the meeting., asked questions, gave valid responses, very positive had lots to say. He said he enjoyed it.

## 2021-07-09 NOTE — ED Notes (Signed)
Pt has gotten up and decided to attend the [place] meeting taking place in the dining room

## 2021-07-09 NOTE — ED Provider Notes (Signed)
Behavioral Health Progress Note  Date and Time: 07/09/2021 2:43 PM Name: [name] MRN:  [id number]  Subjective:   61 year old male with history of anxiety and depression who presented to the [facility] on 1/9 at the direction of his outpatient provider NP Burt Ek for assessment of suicidal thoughts and worsening depression. Patient had reported that he stopped taking his psychiatric medications (wellbutrin and abilify)  severeal months ago d/t improved mood but that for the last ~1.5 months has been having a worsening mood and stopped taking all of his medications. UDS negative; etoh negative  Patient seen and chart reviewed.  Patient has been medication compliant and he has been appropriate with staff and peers on the unit.  Patient interviewed this morning- he describes his mood as "better".  Affect appears brighter.  Patient states that he did not sleep well yesterday until he received trazodone and requests that this medication be scheduled.  Patient states that he does not normally take insulin at home as he usually monitors his diet and blood sugars.  He reports some low back pain is chronic in nature.  Patient states that he previously was prescribed pain medications as well as gabapentin but that these were weaned off and expresses that he does not want to be restarted on medications at this time. Discussed with patient that a hot pack could be made available to assist with low back pain and patient was agreeable. Discussed that no changes will be made to scheduled abilify and wellbutrin at this time and -patient verbalized understanding and was in agreement. Patient was given the opportunity to ask questions and  All questions answered. Patient verbalized understanding regarding plan of care.     Diagnosis:  Final diagnoses:  Severe episode of recurrent major depressive disorder, without psychotic features ([place])    Total Time spent with patient: 20 minutes  Past Psychiatric  History: AUD, in remission; MDD, anxiety Past Medical History:  Past Medical History:  Diagnosis Date   Alcohol dependence ([place])    Allergies    Arthritis    Chest pain    Chronic lower back pain    Chronic pain of right wrist    Coronary artery disease    a. Multiple prior caths/PCI. Cath 2013 with possible spasm of RCA, 70% ISR of mid LCx with subsequent DES to mLCx and prox LCX. b. H/o microvascular angina. c. Recurrent angina 08/2014 - s/p PTCA/DES to prox Cx, PTCA/CBA to OM1.  c. LHC 06/10/15 with patent stents and some ISR in LCX and OM-1 that was not flow limiting --> Rx    Dyslipidemia    a. Intolerant to many statins except tolerating Livalo.   GERD (gastroesophageal reflux disease)    H/O cardiac catheterization 10/25/2018   Heart attack ([name])    Hypertension    Myocardial infarction [facility]) ~ 2010   S/P angioplasty with stent, DES, to proximal and mid LCX 12/15/11 12/15/2011   S/P foot surgery, right 04/2021   Shoulder pain    Stroke ([place])    pt. reports had a stroke around time of MI 2010   Type II diabetes mellitus ([place])    Unstable angina [facility])     Past Surgical History:  Procedure Laterality Date   CARDIAC CATHETERIZATION  06/15/2002   LAD with prox 40% stenosis, norma L main, Cfx with 25% lesion, RCA with long mid 25% stenosis (Dr. Vita Barley)   CARDIAC CATHETERIZATION  04/01/2010   normal L main, LAD wit mild stenosis,  L Cfx with 70% in-stent restenosis, RCA with 70% in-stent restenosis, LVEF >60% (Dr. K. Mali Hilty) - cutting ballon arthrectomy to RCA & Cfx (Dr. Rockne Menghini)   CARDIAC CATHETERIZATION  08/25/2010   preserved global LV contractility; multivessel CAD, diffuse 90-95% in-stent restenosis in prox placed Cfx stent - cutting balloon arthrectomy in Cfx with multiple dilatations 90-95% to 0% stenosis (Dr. Corky Downs)   CARDIAC CATHETERIZATION  01/26/2011   PCI & stenting of aggresive in-stent restenosis within previously stented AV groove Cfx with 3.0x24m Taxus DES  (previous stents were Promus) (Dr. JAdora Fridge   CARDIAC CATHETERIZATION  05/11/2011   preserved LV function, 40% mid LAD stenosis, 30-40% narrowing proximal to stented semgnet of prox Cfx, patent mid RCA stent with smooth 20% narrowing in distal RCA (Dr. TCorky Downs   CARDIAC CATHETERIZATION  12/15/2011   PCI & stenting of proximal & mid Cfx with DES - 3.0x150min proximal, 3.0x1578mn mid (Dr. J. Adora Fridge CARBynumA 06/10/2015   Procedure: Left Heart Cath and Coronary Angiography;  Surgeon: DanJolaine ArtistD;  Location: [facility] [place] [facility];  Service: Cardiovascular;  Laterality: N/A;   CARDIAC CATHETERIZATION  04/65/68/1275cardiometabolic testing  08/09/68/0174good exercise effort, peak VO2 79% predicted with normal VO2 HR curves (mild deconditioning)   COLONOSCOPY  12/2012   diminutive hyperplastic sigmoid poyp so repeat routine 2024   CORONARY BALLOON ANGIOPLASTY N/A 10/25/2018   Procedure: CORONARY BALLOON ANGIOPLASTY;  Surgeon: VarJettie BoozeD;  Location: [facility] [place] [facility];  Service: Cardiovascular;  Laterality: N/A;   CORONARY BALLOON ANGIOPLASTY N/A 09/29/2019   Procedure: CORONARY BALLOON ANGIOPLASTY;  Surgeon: EndNelva BushD;  Location: [facility] [place] [facility];  Service: Cardiovascular;  Laterality: N/A;   EXCISIONAL HEMORRHOIDECTOMY  1984   INTRAVASCULAR ULTRASOUND/IVUS N/A 09/29/2019   Procedure: Intravascular Ultrasound/IVUS;  Surgeon: EndNelva BushD;  Location: [facility] [place] [facility];  Service: Cardiovascular;  Laterality: N/A;   LEFT HEART CATH AND CORONARY ANGIOGRAPHY N/A 10/25/2018   Procedure: LEFT HEART CATH AND CORONARY ANGIOGRAPHY;  Surgeon: VarJettie BoozeD;  Location: [facility];  Service: Cardiovascular;  Laterality: N/A;   LEFT HEART CATH AND CORONARY ANGIOGRAPHY N/A 09/29/2019   Procedure: LEFT HEART CATH AND CORONARY ANGIOGRAPHY;  Surgeon: EndNelva BushD;  Location: [facility] [place] [facility];  Service: Cardiovascular;  Laterality:  N/A;   LEFT HEART CATHETERIZATION WITH CORONARY ANGIOGRAM N/A 05/11/2011   Procedure: LEFT HEART CATHETERIZATION WITH CORONARY ANGIOGRAM;  Surgeon: ThoTroy SineD;  [facility];  Service: Cardiovascular;  Laterality: N/A;  Possible percutaneous coronary intervention, possible IVUS   LEFT HEART CATHETERIZATION WITH CORONARY ANGIOGRAM N/A 12/15/2011   Procedure: LEFT HEART CATHETERIZATION WITH CORONARY ANGIOGRAM;  Surgeon: JonLorretta HarpD;  [facility];  Service: Cardiovascular;  Laterality: N/A;   LEFT HEART CATHETERIZATION WITH CORONARY ANGIOGRAM N/A 09/05/2014   Procedure: LEFT HEART CATHETERIZATION WITH CORONARY ANGIOGRAM;  Surgeon: ChrBurnell BlanksD;  [facility];  Service: Cardiovascular;  Laterality: N/A;   LIPOMA EXCISION     back of the head   NM MYOCAR PERF WALL MOTION  02/2012   lexiscan myoview; mild perfusion defect in mid inferolateral & basal inferolateral region (infarct/scar); EF 52%, abnormal but ow risk scan   PERCUTANEOUS CORONARY STENT INTERVENTION (PCI-S)  09/05/2014   Procedure: PERCUTANEOUS CORONARY STENT INTERVENTION (PCI-S);  Surgeon: ChrBurnell BlanksD;  [facility];  Service: Cardiovascular;;  TONSILLECTOMY     Family History:  Family History  Problem Relation Age of Onset   Leukemia Mother    Prostate cancer Father    Cancer Brother    Coronary artery disease Paternal Grandmother    Cancer Paternal Grandfather    Migraines Neg Hx    Headache Neg Hx    Family Psychiatric  History:  Older sister, brother and mother -depression. Alcoholism "runs in the family" Oldest sister with attempted suicide (at least twice) Uncle with completed suicide [place] grandmother completed suicide Social History:  Social History   Substance and Sexual Activity  Alcohol Use Not Currently   Comment: 2 bottles of wine on 12/08/2019 (one occurrence)     Social History   Substance and Sexual Activity  Drug Use Not  Currently   Types: Cocaine   Comment: last use several months ago    Social History   Socioeconomic History   Marital status: Widowed    Spouse name: Not on file   Number of children: 2   Years of education: GED   Highest education level: Not on file  Occupational History   Not on file  Tobacco Use   Smoking status: Former    Packs/day: 1.00    Years: 10.00    Pack years: 10.00    Types: Cigarettes    Quit date: 10/07/2018    Years since quitting: 2.7   Smokeless tobacco: Never  Vaping Use   Vaping Use: Never used  Substance and Sexual Activity   Alcohol use: Not Currently    Comment: 2 bottles of wine on 12/08/2019 (one occurrence)   Drug use: Not Currently    Types: Cocaine    Comment: last use several months ago   Sexual activity: Not Currently  Other Topics Concern   Not on file  Social History Narrative   ** Merged History Encounter **       Social Determinants of Health   Financial Resource Strain: Not on file  Food Insecurity: Not on file  Transportation Needs: Not on file  Physical Activity: Not on file  Stress: Not on file  Social Connections: Not on file   SDOH:  SDOH Screenings   Alcohol Screen: Medium Risk   Last Alcohol Screening Score (AUDIT): 27  Depression (PHQ2-9): Medium Risk   PHQ-2 Score: 22  Financial Resource Strain: Not on file  Food Insecurity: Not on file  Housing: Not on file  Physical Activity: Not on file  Social Connections: Not on file  Stress: Not on file  Tobacco Use: Medium Risk   Smoking Tobacco Use: Former   Smokeless Tobacco Use: Never   Passive Exposure: Not on file  Transportation Needs: Not on file   Additional Social History:    Pain Medications: See MAR Prescriptions: See MAR Over the Counter: See MAR History of alcohol / drug use?: Yes Longest period of sobriety (when/how long): 4 years and 1 month Negative Consequences of Use: Financial, Personal relationships Withdrawal Symptoms: Blackouts                     Sleep: Fair  Appetite:  Poor  Current Medications:  Current Facility-Administered Medications  Medication Dose Route Frequency Provider Last Rate Last Admin   acetaminophen (TYLENOL) tablet 650 mg  650 mg Oral Q6H PRN Derrill Center, NP   650 mg at 07/08/21 1737   alum & mag hydroxide-simeth (MAALOX/MYLANTA) 200-200-20 MG/5ML suspension 30 mL  30 mL Oral Q4H PRN Lewis,  Marcy Panning, NP       amLODipine (NORVASC) tablet 10 mg  10 mg Oral Daily Ival Bible, MD   10 mg at 07/09/21 0846   ARIPiprazole (ABILIFY) tablet 2 mg  2 mg Oral Daily Ival Bible, MD   2 mg at 07/09/21 0846   aspirin EC tablet 81 mg  81 mg Oral Daily Ival Bible, MD   81 mg at 07/09/21 0846   buPROPion (WELLBUTRIN XL) 24 hr tablet 150 mg  150 mg Oral Daily Ival Bible, MD   150 mg at 07/09/21 0846   clopidogrel (PLAVIX) tablet 75 mg  75 mg Oral Daily Ival Bible, MD   75 mg at 07/09/21 0846   hydrOXYzine (ATARAX) tablet 25 mg  25 mg Oral TID PRN Derrill Center, NP       magnesium hydroxide (MILK OF MAGNESIA) suspension 30 mL  30 mL Oral Daily PRN Derrill Center, NP   30 mL at 07/08/21 1001   metFORMIN (GLUCOPHAGE) tablet 500 mg  500 mg Oral BID WC Ival Bible, MD   500 mg at 07/09/21 0846   metoprolol tartrate (LOPRESSOR) tablet 50 mg  50 mg Oral BID Ival Bible, MD   50 mg at 07/09/21 0846   traZODone (DESYREL) tablet 50 mg  50 mg Oral QHS Ival Bible, MD       Current Outpatient Medications  Medication Sig Dispense Refill   Accu-Chek Softclix Lancets lancets SMARTSIG:1 Topical 4 Times Daily     amLODipine (NORVASC) 10 MG tablet Take 1 tablet by mouth once daily (Patient taking differently: Take 10 mg by mouth daily.) 90 tablet 0   aspirin EC 81 MG EC tablet Take 1 tablet (81 mg total) by mouth daily. (May buy from over the counter): Swallow whole for heart health 30 tablet 0   blood glucose meter kit and supplies KIT Dispense based  on patient and insurance preference. Use up to four times daily as directed. (FOR ICD-9 250.00, 250.01). 1 each 0   Blood Pressure Monitor KIT 1 kit by Does not apply route daily. 1 kit 0   clopidogrel (PLAVIX) 75 MG tablet Take 1 tablet (75 mg total) by mouth daily. 90 tablet 1   EPINEPHrine 0.3 mg/0.3 mL IJ SOAJ injection Inject 0.3 mg into the muscle as needed for anaphylaxis. 1 each 2   glucose blood (ACCU-CHEK GUIDE) test strip Use as instructed 300 each 3   LIVALO 4 MG TABS Take 1 tablet (4 mg total) by mouth daily. (Patient taking differently: Take 4 mg by mouth daily.) 90 tablet 3   metFORMIN (GLUCOPHAGE) 500 MG tablet TAKE 1 TABLET BY MOUTH TWICE DAILY WITH A MEAL (Patient taking differently: Take 500 mg by mouth 2 (two) times daily with a meal.) 180 tablet 2   metoprolol tartrate (LOPRESSOR) 100 MG tablet Take 0.5 tablets (50 mg total) by mouth 2 (two) times daily. 180 tablet 3   nitroGLYCERIN (NITROSTAT) 0.4 MG SL tablet Place 1 tablet (0.4 mg total) under the tongue every 5 (five) minutes x 3 doses as needed for chest pain. 25 tablet 3   Omega-3 Fatty Acids (FISH OIL PO) Take 1 capsule by mouth daily.     SF 5000 PLUS 1.1 % CREA dental cream Place 1 application onto teeth at bedtime as needed (For teeth sensitivity).     sildenafil (VIAGRA) 100 MG tablet Take 1 tablet (100 mg total) by mouth daily as needed. (  Patient taking differently: Take 100 mg by mouth daily as needed for erectile dysfunction.) 10 tablet 5   Testosterone (ANDROGEL PUMP) 20.25 MG/ACT (1.62%) GEL Place 1 Pump onto the skin daily. 75 g 2    Labs  Lab Results:  Admission on 07/07/2021  Component Date Value Ref Range Status   SARS Coronavirus 2 by RT PCR 07/07/2021 NEGATIVE  NEGATIVE Final   Comment: (NOTE) SARS-CoV-2 target nucleic acids are NOT DETECTED.  The SARS-CoV-2 RNA is generally detectable in upper respiratory specimens during the acute phase of infection. The lowest concentration of SARS-CoV-2 viral  copies this assay can detect is 138 copies/mL. A negative result does not preclude SARS-Cov-2 infection and should not be used as the sole basis for treatment or other patient management decisions. A negative result may occur with  improper specimen collection/handling, submission of specimen other than nasopharyngeal swab, presence of viral mutation(s) within the areas targeted by this assay, and inadequate number of viral copies(<138 copies/mL). A negative result must be combined with clinical observations, patient history, and epidemiological information. The expected result is Negative.  Fact Sheet for Patients:  EntrepreneurPulse.com.au  Fact Sheet for Healthcare Providers:  IncredibleEmployment.be  This test is no                          t yet approved or cleared by the [place] FDA and  has been authorized for detection and/or diagnosis of SARS-CoV-2 by FDA under an Emergency Use Authorization (EUA). This EUA will remain  in effect (meaning this test can be used) for the duration of the COVID-19 declaration under Section 564(b)(1) of the Act, 21 U.S.C.section 360bbb-3(b)(1), unless the authorization is terminated  or revoked sooner.       Influenza A by PCR 07/07/2021 NEGATIVE  NEGATIVE Final   Influenza B by PCR 07/07/2021 NEGATIVE  NEGATIVE Final   Comment: (NOTE) The Xpert Xpress SARS-CoV-2/FLU/RSV plus assay is intended as an aid in the diagnosis of influenza from Nasopharyngeal swab specimens and should not be used as a sole basis for treatment. Nasal washings and aspirates are unacceptable for Xpert Xpress SARS-CoV-2/FLU/RSV testing.  Fact Sheet for Patients: EntrepreneurPulse.com.au  Fact Sheet for Healthcare Providers: IncredibleEmployment.be  This test is not yet approved or cleared by the [place] FDA and has been authorized for detection and/or diagnosis of SARS-CoV-2 by FDA  under an Emergency Use Authorization (EUA). This EUA will remain in effect (meaning this test can be used) for the duration of the COVID-19 declaration under Section 564(b)(1) of the Act, 21 U.S.C. section 360bbb-3(b)(1), unless the authorization is terminated or revoked.  Performed at [facility], [place] [address], [place], [place] [address]    WBC 07/07/2021 4.8  4.0 - 10.5 K/uL Final   RBC 07/07/2021 5.00  4.22 - 5.81 MIL/uL Final   Hemoglobin 07/07/2021 13.9  13.0 - 17.0 g/dL Final   HCT 07/07/2021 41.7  39.0 - 52.0 % Final   MCV 07/07/2021 83.4  80.0 - 100.0 fL Final   MCH 07/07/2021 27.8  26.0 - 34.0 pg Final   MCHC 07/07/2021 33.3  30.0 - 36.0 g/dL Final   RDW 07/07/2021 12.3  11.5 - 15.5 % Final   Platelets 07/07/2021 229  150 - 400 K/uL Final   nRBC 07/07/2021 0.0  0.0 - 0.2 % Final   Neutrophils Relative % 07/07/2021 46  % Final   Neutro Abs 07/07/2021 2.3  1.7 - 7.7 K/uL  Final   Lymphocytes Relative 07/07/2021 38  % Final   Lymphs Abs 07/07/2021 1.9  0.7 - 4.0 K/uL Final   Monocytes Relative 07/07/2021 9  % Final   Monocytes Absolute 07/07/2021 0.4  0.1 - 1.0 K/uL Final   Eosinophils Relative 07/07/2021 5  % Final   Eosinophils Absolute 07/07/2021 0.2  0.0 - 0.5 K/uL Final   Basophils Relative 07/07/2021 1  % Final   Basophils Absolute 07/07/2021 0.1  0.0 - 0.1 K/uL Final   Immature Granulocytes 07/07/2021 1  % Final   Abs Immature Granulocytes 07/07/2021 0.03  0.00 - 0.07 K/uL Final   Performed at [facility] [address] [place], [place] [address]   Sodium 07/07/2021 136  135 - 145 mmol/L Final   Potassium 07/07/2021 4.3  3.5 - 5.1 mmol/L Final   Chloride 07/07/2021 104  98 - 111 mmol/L Final   CO2 07/07/2021 24  22 - 32 mmol/L Final   Glucose, Bld 07/07/2021 144 (H)  70 - 99 mg/dL Final   Glucose reference range applies only to samples taken after fasting for at least 8 hours.   BUN 07/07/2021 15  6 - 20 mg/dL Final   Creatinine, Ser 07/07/2021  0.92  0.61 - 1.24 mg/dL Final   Calcium 07/07/2021 9.2  8.9 - 10.3 mg/dL Final   Total Protein 07/07/2021 6.6  6.5 - 8.1 g/dL Final   Albumin 07/07/2021 4.3  3.5 - 5.0 g/dL Final   AST 07/07/2021 24  15 - 41 U/L Final   ALT 07/07/2021 41  0 - 44 U/L Final   Alkaline Phosphatase 07/07/2021 51  38 - 126 U/L Final   Total Bilirubin 07/07/2021 0.7  0.3 - 1.2 mg/dL Final   GFR, Estimated 07/07/2021 >60  >60 mL/min Final   Comment: (NOTE) Calculated using the CKD-EPI Creatinine Equation (2021)    Anion gap 07/07/2021 8  5 - 15 Final   Performed at [facility], [place] [address], [place], [place] [address]   TSH 07/07/2021 3.372  0.350 - 4.500 uIU/mL Final   Comment: Performed by a 3rd Generation assay with a functional sensitivity of <=0.01 uIU/mL. Performed at [facility], [place] [address], [place], [place] [address]    Cholesterol 07/07/2021 144  0 - 200 mg/dL Final   Triglycerides 07/07/2021 45  <150 mg/dL Final   HDL 07/07/2021 44  >40 mg/dL Final   Total CHOL/HDL Ratio 07/07/2021 3.3  RATIO Final   VLDL 07/07/2021 9  0 - 40 mg/dL Final   LDL Cholesterol 07/07/2021 91  0 - 99 mg/dL Final   Comment:        Total Cholesterol/HDL:CHD Risk Coronary Heart Disease Risk Table                     Men   Women  1/2 Average Risk   3.4   3.3  Average Risk       5.0   4.4  2 X Average Risk   9.6   7.1  3 X Average Risk  23.4   11.0        Use the calculated Patient Ratio above and the CHD Risk Table to determine the patient's CHD Risk.        ATP III CLASSIFICATION (LDL):  <100     mg/dL   Optimal  100-129  mg/dL   Near or Above  Optimal  130-159  mg/dL   Borderline  160-189  mg/dL   High  >190     mg/dL   Very High Performed at [facility] [address], [place], [place] [address]    POC Amphetamine UR 07/07/2021 None Detected  NONE DETECTED (Cut Off Level 1000 ng/mL) Preliminary   POC Secobarbital (BAR) 07/07/2021 None Detected  NONE DETECTED (Cut  Off Level 300 ng/mL) Preliminary   POC Buprenorphine (BUP) 07/07/2021 None Detected  NONE DETECTED (Cut Off Level 10 ng/mL) Preliminary   POC Oxazepam (BZO) 07/07/2021 None Detected  NONE DETECTED (Cut Off Level 300 ng/mL) Preliminary   POC Cocaine UR 07/07/2021 None Detected  NONE DETECTED (Cut Off Level 300 ng/mL) Preliminary   POC Methamphetamine UR 07/07/2021 None Detected  NONE DETECTED (Cut Off Level 1000 ng/mL) Preliminary   POC Morphine 07/07/2021 None Detected  NONE DETECTED (Cut Off Level 300 ng/mL) Preliminary   POC Oxycodone UR 07/07/2021 None Detected  NONE DETECTED (Cut Off Level 100 ng/mL) Preliminary   POC Methadone UR 07/07/2021 None Detected  NONE DETECTED (Cut Off Level 300 ng/mL) Preliminary   POC Marijuana UR 07/07/2021 None Detected  NONE DETECTED (Cut Off Level 50 ng/mL) Preliminary   Alcohol, Ethyl (B) 07/08/2021 <10  <10 mg/dL Final   Comment: (NOTE) Lowest detectable limit for serum alcohol is 10 mg/dL.  For medical purposes only. Performed at [facility], [place] [address] [place], [place] [address]    Glucose-Capillary 07/08/2021 158 (H)  70 - 99 mg/dL Final   Glucose reference range applies only to samples taken after fasting for at least 8 hours.   Glucose-Capillary 07/09/2021 280 (H)  70 - 99 mg/dL Final   Glucose reference range applies only to samples taken after fasting for at least 8 hours.  Clinical Support on 07/07/2021  Component Date Value Ref Range Status   SARSCOV2ONAVIRUS 2 AG 07/07/2021 NEGATIVE  NEGATIVE Final   Comment: (NOTE) SARS-CoV-2 antigen NOT DETECTED.   Negative results are presumptive.  Negative results do not preclude SARS-CoV-2 infection and should not be used as the sole basis for treatment or other patient management decisions, including infection  control decisions, particularly in the presence of clinical signs and  symptoms consistent with COVID-19, or in those who have been in contact with the virus.  Negative results  must be combined with clinical observations, patient history, and epidemiological information. The expected result is Negative.  Fact Sheet for Patients: HandmadeRecipes.com.cy  Fact Sheet for Healthcare Providers: FuneralLife.at  This test is not yet approved or cleared by the [place] FDA and  has been authorized for detection and/or diagnosis of SARS-CoV-2 by FDA under an Emergency Use Authorization (EUA).  This EUA will remain in effect (meaning this test can be used) for the duration of  the COV                          ID-19 declaration under Section 564(b)(1) of the Act, 21 U.S.C. section 360bbb-3(b)(1), unless the authorization is terminated or revoked sooner.    Office Visit on 04/24/2021  Component Date Value Ref Range Status   Testosterone 04/24/2021 327  264 - 916 ng/dL Final   Comment: Adult male reference interval is based on a population of healthy nonobese males (BMI <30) between 2 and 68 years old. Annabella, Lillie [phone]. PMID: [id number].   Office Visit on 03/05/2021  Component Date Value Ref Range Status  Color, UA 03/05/2021 yellow  yellow Final   Clarity, UA 03/05/2021 clear  clear Final   Glucose, UA 03/05/2021 negative  negative mg/dL Final   Bilirubin, UA 03/05/2021 negative  negative Final   Ketones, POC UA 03/05/2021 negative  negative mg/dL Final   Spec Grav, UA 03/05/2021 1.020  1.010 - 1.025 Final   Blood, UA 03/05/2021 negative  negative Final   pH, UA 03/05/2021 6.0  5.0 - 8.0 Final   POC PROTEIN,UA 03/05/2021 negative  negative, trace Final   Urobilinogen, UA 03/05/2021 0.2  0.2 or 1.0 E.U./dL Final   Nitrite, UA 03/05/2021 Negative  Negative Final   Leukocytes, UA 03/05/2021 Negative  Negative Final   Hemoglobin A1C 03/05/2021 5.0  4.0 - 5.6 % Final   HbA1c POC (<> result, manual entry) 03/05/2021 5.0  4.0 - 5.6 % Final   HbA1c, POC (prediabetic range) 03/05/2021 5.0 (A)   5.7 - 6.4 % Final   HbA1c, POC (controlled diabetic ra* 03/05/2021 5.0  0.0 - 7.0 % Final   POC Glucose 03/05/2021 122 (A)  70 - 99 mg/dl Final   Cholesterol, Total 03/05/2021 152  100 - 199 mg/dL Final   Triglycerides 03/05/2021 99  0 - 149 mg/dL Final   HDL 03/05/2021 44  >39 mg/dL Final   VLDL Cholesterol Cal 03/05/2021 18  5 - 40 mg/dL Final   LDL Chol Calc (NIH) 03/05/2021 90  0 - 99 mg/dL Final   Chol/HDL Ratio 03/05/2021 3.5  0.0 - 5.0 ratio Final   Comment:                                   T. Chol/HDL Ratio                                             Men  Women                               1/2 Avg.Risk  3.4    3.3                                   Avg.Risk  5.0    4.4                                2X Avg.Risk  9.6    7.1                                3X Avg.Risk 23.4   11.0    Glucose 03/05/2021 103 (H)  65 - 99 mg/dL Final   BUN 03/05/2021 13  6 - 24 mg/dL Final   Creatinine, Ser 03/05/2021 0.92  0.76 - 1.27 mg/dL Final   eGFR 03/05/2021 96  >59 mL/min/1.73 Final   BUN/Creatinine Ratio 03/05/2021 14  9 - 20 Final   Sodium 03/05/2021 142  134 - 144 mmol/L Final   Potassium 03/05/2021 4.8  3.5 - 5.2 mmol/L Final   Chloride 03/05/2021 103  96 - 106 mmol/L Final   Calcium 03/05/2021 9.7  8.7 -  10.2 mg/dL Final   Total Protein 03/05/2021 7.2  6.0 - 8.5 g/dL Final   Albumin 03/05/2021 5.0 (H)  3.8 - 4.9 g/dL Final   Globulin, Total 03/05/2021 2.2  1.5 - 4.5 g/dL Final   Albumin/Globulin Ratio 03/05/2021 2.3 (H)  1.2 - 2.2 Final   Bilirubin Total 03/05/2021 0.4  0.0 - 1.2 mg/dL Final   Alkaline Phosphatase 03/05/2021 64  44 - 121 IU/L Final   AST 03/05/2021 24  0 - 40 IU/L Final  Office Visit on 02/12/2021  Component Date Value Ref Range Status   Glucose, UA 02/12/2021 Negative  Negative Final   Bilirubin, UA 02/12/2021 neg   Final   Ketones, UA 02/12/2021 neg   Final   Spec Grav, UA 02/12/2021 <=1.005 (A)  1.010 - 1.025 Final   Blood, UA 02/12/2021 neg   Final   pH, UA  02/12/2021 6.5  5.0 - 8.0 Final   Protein, UA 02/12/2021 Negative  Negative Final   Urobilinogen, UA 02/12/2021 0.2  0.2 or 1.0 E.U./dL Final   Nitrite, UA 02/12/2021 neg   Final   Leukocytes, UA 02/12/2021 Negative  Negative Final   Hemoglobin A1C 02/12/2021 6.2 (A)  4.0 - 5.6 % Final  Orders Only on 01/21/2021  Component Date Value Ref Range Status   Vit D, 25-Hydroxy 01/21/2021 44.9  30.0 - 100.0 ng/mL Final   Comment: Vitamin D deficiency has been defined by the Institute of Medicine and an Endocrine Society practice guideline as a level of serum 25-OH vitamin D less than 20 ng/mL (1,2). The Endocrine Society went on to further define vitamin D insufficiency as a level between 21 and 29 ng/mL (2). 1. IOM (Institute of Medicine). 2010. Dietary reference    intakes for calcium and D. [place]: The    [employer]. 2. Holick MF, Bink[place], Bischoff-Ferrari HA, et al.    Evaluation, treatment, and prevention of vitamin D    deficiency: an Endocrine Society clinical practice    guideline. JCEM. 2011 Jul; 96(7):1911-30.    Testosterone 01/21/2021 140 (L)  264 - 916 ng/dL Final   Comment: Adult male reference interval is based on a population of healthy nonobese males (BMI <30) between 43 and 16 years old. [place], [place] [phone]. PMID: 18299371.    Vitamin B-12 01/21/2021 537  232 - 1,245 pg/mL Final   Magnesium 01/21/2021 1.9  1.6 - 2.3 mg/dL Final    Blood Alcohol level:  Lab Results  Component Value Date   ETH <10 07/08/2021   ETH 18 (H) 69/67/8938    Metabolic Disorder Labs: Lab Results  Component Value Date   HGBA1C 5.0 03/05/2021   HGBA1C 5.0 03/05/2021   HGBA1C 5.0 (A) 03/05/2021   HGBA1C 5.0 03/05/2021   MPG 136.98 03/16/2020   MPG 151.33 12/12/2019   Lab Results  Component Value Date   PROLACTIN 13.0 03/17/2020   Lab Results  Component Value Date   CHOL 144 07/07/2021   TRIG 45 07/07/2021   HDL 44 07/07/2021   CHOLHDL  3.3 07/07/2021   VLDL 9 07/07/2021   LDLCALC 91 07/07/2021   LDLCALC 90 03/05/2021    Therapeutic Lab Levels: No results found for: LITHIUM No results found for: VALPROATE No components found for:  CBMZ  Physical Findings   AIMS    Flowsheet Row Admission (Discharged) from OP Visit from 09/07/2019 in [place] [facility] Admission (Discharged) from OP Visit from 01/12/2019 in [place]  [facility] Admission (Discharged) from OP Visit from 08/15/2018 in [place] [facility] Admission (Discharged) from OP Visit from 10/02/2017 in [place] [facility] Admission (Discharged) from 06/22/2017 in [place] [facility]  AIMS Total Score 0 0 0 0 0      AUDIT    Flowsheet Row Office Visit from 08/27/2020 in [facility] Admission (Discharged) from OP Visit from 03/18/2020 in [place] [facility] Admission (Discharged) from 12/10/2019 in [place] [facility] Admission (Discharged) from OP Visit from 09/07/2019 in [place] [facility] Admission (Discharged) from OP Visit from 01/12/2019 in [place] [facility]  Alcohol Use Disorder Identification Test Final Score (AUDIT) 27 28 13 25  0      GAD-7    Flowsheet Row Clinical Support from 07/07/2021 in [facility] Office Visit from 08/27/2020 in [facility]  Total GAD-7 Score 19 7      PHQ2-9    Flowsheet Row Clinical Support from 07/07/2021 in [facility] Office Visit from 04/24/2021 in [place] Office Visit from 03/05/2021 in [place] Video Visit from 01/20/2021 in [place] Office Visit from 08/29/2020 in [facility]  PHQ-2 Total Score 5  0 0 0 6  PHQ-9 Total Score 22 -- 3 3 22       Flowsheet Row ED from 07/07/2021 in [facility] Most recent reading at 07/07/2021  2:29 PM Clinical Support from 07/07/2021 in [facility] Most recent reading at 07/07/2021  9:57 AM ED from 01/12/2021 in [facility] [place] Most recent reading at 01/12/2021 12:18 PM  C-SSRS RISK CATEGORY Low Risk Error: Q6 is Yes, you must answer 7 No Risk        Musculoskeletal  Strength & Muscle Tone: within normal limits Gait & Station: normal Patient leans: N/A  Psychiatric Specialty Exam  Presentation  General Appearance: Appropriate for Environment; Casual; Fairly Groomed  Eye Contact:Good  Speech:Clear and Coherent; Normal Rate  Speech Volume:Normal  Handedness:Right   Mood and Affect  Mood:Euthymic (brighter than yesterday)  Affect:Appropriate; Congruent   Thought Process  Thought Processes:Coherent; Goal Directed; Linear  Descriptions of Associations:Intact  Orientation:Full (Time, Place and Person)  Thought Content:Logical; WDL     Hallucinations:Hallucinations: None  Ideas of Reference:None  Suicidal Thoughts:Suicidal Thoughts: No  Homicidal Thoughts:Homicidal Thoughts: No   Sensorium  Memory:Immediate Good; Recent Good; Remote Good  Judgment:Fair  Insight:Good   Executive Functions  Concentration:Good  Attention Span:Good  [place] of Knowledge:Good  Language:Good   Psychomotor Activity  Psychomotor Activity:Psychomotor Activity: Normal   Assets  Assets:Communication Skills; Desire for Improvement; Social Support; Housing; Resilience   Sleep  Sleep:Sleep: Poor   No data recorded   Physical Exam  Physical Exam Constitutional:      Appearance: Normal appearance. He is normal weight.  HENT:     Head: Normocephalic and atraumatic.  Eyes:     Conjunctiva/sclera: Conjunctivae normal.  Pulmonary:     Effort:  Pulmonary effort is normal.  Neurological:     General: No focal deficit present.     Mental Status: He is alert and oriented to person, place, and time.  Psychiatric:        Attention and Perception: Attention and perception normal.  Speech: Speech normal.        Behavior: Behavior normal. Behavior is cooperative.        Thought Content: Thought content normal.   Review of Systems  Constitutional:  Negative for chills and fever.  HENT:  Negative for hearing loss.   Eyes:  Negative for discharge and redness.  Respiratory:  Negative for cough.   Cardiovascular:  Negative for chest pain.  Gastrointestinal:  Negative for abdominal pain.  Musculoskeletal:  Positive for back pain.       Chronic LBP  Neurological:  Negative for headaches.  Psychiatric/Behavioral:  Positive for depression. Negative for hallucinations and substance abuse.   Blood pressure (!) 132/91, pulse 66, temperature 99.6 F (37.6 C), temperature source Oral, resp. rate 18, SpO2 97 %. There is no height or weight on file to calculate BMI.  Treatment Plan Summary: 61 year old male with history of anxiety and depression who presented to the [facility] on 1/9 at the direction of his outpatient provider NP Burt Ek for assessment of suicidal thoughts and worsening depression. Patient had reported that he stopped taking his psychiatric medications (wellbutrin and abilify)  severeal months ago d/t improved mood but that for the last ~1.5 months has been having a worsening mood and stopped taking all of his medications. UDS negative, etoh neg  Patient with ongoing low mood; although mood has improved and affect appears brighter with re-initiation of wellbutrin 150 mg XL and abilify 2 mg. (previously on 5 mg). Patient remains appropriate for continued treatment on the [facility].   MDD -continue wellbutrin 150 mg XL -continue abilify 2 mg daily. Can consider increase if tolerates well/if there is indication/clinically  approrpiate-historically did well on 5 mg.  -will schedule trazodone 50 mg qhs  T2DM -continue metformin 500 mg   CAD -continue home ASA 81 mg -continue home plavix 75 mg  Hypertension -continue home metoprolol 100 mg daily -continue home norvasc 10 mg   Dispo: ongoing. Pending symptomatic improvement. Likely dc to home with f/u with Burt Ek. Could consider IOP/PHP if patient agreeable   Ival Bible, MD 07/09/2021 2:43 PM

## 2021-07-09 NOTE — Progress Notes (Signed)
Pt is presently in group therapy. Pt has been watching TV quietly in the dinning area. No distress noted. Monitoring for pt's safety.

## 2021-07-09 NOTE — ED Notes (Signed)
Pt came to nurse and stated he need medication to help him sleep because he is unable to sleep.Provider Margorie John NP) notified.

## 2021-07-09 NOTE — Progress Notes (Signed)
Pt is awake, alert and oriented. Pt complained of lower back pain. Pt declined PRN Tylenol when offered. No signs of acute distress or discomfort noted. Administered scheduled meds with no incident. Pt did NOT report of any bleeding due to taking blood thinners.Pt denies current SI/HI/AVH. Staff will monitor for pt's safety.

## 2021-07-10 DIAGNOSIS — Z20822 Contact with and (suspected) exposure to covid-19: Secondary | ICD-10-CM | POA: Diagnosis not present

## 2021-07-10 DIAGNOSIS — F411 Generalized anxiety disorder: Secondary | ICD-10-CM | POA: Diagnosis not present

## 2021-07-10 DIAGNOSIS — R45851 Suicidal ideations: Secondary | ICD-10-CM | POA: Diagnosis not present

## 2021-07-10 DIAGNOSIS — F332 Major depressive disorder, recurrent severe without psychotic features: Secondary | ICD-10-CM | POA: Diagnosis not present

## 2021-07-10 MED ORDER — ASPIRIN 81 MG PO TBEC: 81.0000 mg | DELAYED_RELEASE_TABLET | Freq: Every day | ORAL | 0 refills | Status: DC

## 2021-07-10 MED ORDER — TRAZODONE HCL 50 MG PO TABS: 50.0000 mg | ORAL_TABLET | Freq: Every day | ORAL | 0 refills | Status: DC

## 2021-07-10 MED ORDER — BUPROPION HCL ER (XL) 150 MG PO TB24: 150.0000 mg | ORAL_TABLET | Freq: Every day | ORAL | 0 refills | Status: DC

## 2021-07-10 MED ORDER — ARIPIPRAZOLE 2 MG PO TABS: 2.0000 mg | ORAL_TABLET | Freq: Every day | ORAL | 0 refills | Status: DC

## 2021-07-10 MED ORDER — METOPROLOL TARTRATE 50 MG PO TABS: 50.0000 mg | ORAL_TABLET | Freq: Two times a day (BID) | ORAL | 0 refills | Status: DC

## 2021-07-10 MED ORDER — AMLODIPINE BESYLATE 10 MG PO TABS: 10.0000 mg | ORAL_TABLET | Freq: Every day | ORAL | 0 refills | Status: DC

## 2021-07-10 MED ORDER — METFORMIN HCL 500 MG PO TABS
500.0000 mg | ORAL_TABLET | Freq: Two times a day (BID) | ORAL | 0 refills | Status: DC
Start: 2021-07-10 — End: 2021-12-04

## 2021-07-10 MED ORDER — CLOPIDOGREL BISULFATE 75 MG PO TABS: 75.0000 mg | ORAL_TABLET | Freq: Every day | ORAL | 0 refills | Status: DC

## 2021-07-10 MED ORDER — HYDROXYZINE HCL 25 MG PO TABS: 25.0000 mg | ORAL_TABLET | Freq: Three times a day (TID) | ORAL | 0 refills | Status: DC | PRN

## 2021-07-10 NOTE — Progress Notes (Signed)
Pt is awake, alert and oriented. Pt did not voice any complaints of pain or discomfort. No signs of acute distress noted. Administered scheduled meds with no incident. Pt denies current SI/HI/AVH. Staff will monitor for pt's safety.

## 2021-07-10 NOTE — ED Provider Notes (Signed)
FBC/OBS ASAP Discharge Summary  Date and Time: 07/10/2021 4:25 PM  Name: [name]  MRN:  [id number]   Discharge Diagnoses:  Final diagnoses:  Severe episode of recurrent major depressive disorder, without psychotic features ([place])    Subjective:  Patient seen and chart reviewed.  Patient has been medication compliant and he has been appropriate with staff and peers on the unit.  Patient interviewed this morning.  Patient describes his mood as "good".  Patient states that he had some difficulty sleeping well but attributes this to staff being allowed overnight.  Patient denies SI/HI/AVH.  Patient discusses at length that he enjoys the groups that he is currently sitting and waiting for the next group to start.  Patient states that he is starting to feel more motivated and that "I am thinking about going home and cleaning up the kitchen cabinet and refrigerator".  Discussed disposition, patient expresses he will likely feel ready for discharge tomorrow.  Patient denies side effects to medications and has been tolerating medications well.  He reports tingling in his hands and his feet after he drinks sugary beverages but otherwise denies all physical complaints.  Patient states that this is not uncommon for him to experience this due to his diabetes. Patient was given the opportunity to ask questions and  All questions answered. Patient verbalized understanding regarding plan of care.   Later patient informed staff that he felt ready for discharge today. Patient was discharged her request.  On my interview, patient is in NAD, alert, oriented, calm, cooperative, and attentive, with normal affect, speech, and behavior. Objectively, there is no evidence of psychosis/ mania (able to converse coherently, linear and goal directed thought, no RIS, no distractibility, not pre-occupied, no FOI, etc) nor depression to the point of suicidality (able to concentrate, affect full and reactive, speech normal  r/v/t, no psychomotor retardation/agitation, etc).  Overall, patient appears to be at the point, in the absence of inhibiting or disinhibiting symptoms, where he can successfully move to lesser restrictive setting for care and patient was discharged per request     Stay Summary:  61 year old male with history of anxiety and depression who presented to the [facility] on 1/9 at the direction of his outpatient provider [name] for assessment of suicidal thoughts and worsening depression. Patient had reported that he stopped taking his psychiatric medications (wellbutrin and abilify)  severeal months ago d/t improved mood but that for the last ~1.5 months has been having a worsening mood and stopped taking all of his medications. UDS negative; etoh negative.  Patient was started on Wellbutrin 150 mg XL and Abilify 2 mg on day of admission as well as other home medications.  Patient tolerated medications well and these were continued throughout his stay-no SE/AE reported patient attended groups, was appropriate with staff and peers on the unit and was medication compliant throughout his stay. Upon completion of this admission the [name] was both mentally and medically stable for discharge denying suicidal/homicidal ideation, symptoms that would be consistent with psychosis (AVH, IOR, paranoia, etc).   On my interview today, day of discharge, , patient is in NAD, alert, oriented, calm, cooperative, and attentive, with normal affect, speech, and behavior. Objectively, there is no evidence of psychosis/ mania (able to converse coherently, linear and goal directed thought, no RIS, no distractibility, not pre-occupied, no FOI, etc) nor depression to the point of suicidality (able to concentrate, affect full and reactive, speech normal r/v/t, no psychomotor retardation/agitation, etc).  Overall, patient appears to be at the point, in the absence of inhibiting or disinhibiting symptoms, where he can  successfully move to lesser restrictive setting for care.     Total Time spent with patient: 20 minutes  Past Psychiatric History: AUD, in remission; MDD, anxiety Past Medical History:  Past Medical History:  Diagnosis Date   Alcohol dependence ([place])    Allergies    Arthritis    Chest pain    Chronic lower back pain    Chronic pain of right wrist    Coronary artery disease    a. Multiple prior caths/PCI. Cath 2013 with possible spasm of RCA, 70% ISR of mid LCx with subsequent DES to mLCx and prox LCX. b. H/o microvascular angina. c. Recurrent angina 08/2014 - s/p PTCA/DES to prox Cx, PTCA/CBA to OM1.  c. LHC 06/10/15 with patent stents and some ISR in LCX and OM-1 that was not flow limiting --> Rx    Dyslipidemia    a. Intolerant to many statins except tolerating Livalo.   GERD (gastroesophageal reflux disease)    H/O cardiac catheterization 10/25/2018   Heart attack [facility])    Hypertension    Myocardial infarction [facility]) ~ 2010   S/P angioplasty with stent, DES, to proximal and mid LCX 12/15/11 12/15/2011   S/P foot surgery, right 04/2021   Shoulder pain    Stroke ([place])    pt. reports had a stroke around time of MI 2010   Type II diabetes mellitus ([place])    Unstable angina [facility])     Past Surgical History:  Procedure Laterality Date   CARDIAC CATHETERIZATION  06/15/2002   LAD with prox 40% stenosis, norma L main, Cfx with 25% lesion, RCA with long mid 25% stenosis ([name])   CARDIAC CATHETERIZATION  04/01/2010   normal L main, LAD wit mild stenosis, L Cfx with 70% in-stent restenosis, RCA with 70% in-stent restenosis, LVEF >60% ([name]) - cutting ballon arthrectomy to RCA & Cfx ([name])   CARDIAC CATHETERIZATION  08/25/2010   preserved global LV contractility; multivessel CAD, diffuse 90-95% in-stent restenosis in prox placed Cfx stent - cutting balloon arthrectomy in Cfx with multiple dilatations 90-95% to 0% stenosis ([name])   CARDIAC  CATHETERIZATION  01/26/2011   PCI & stenting of aggresive in-stent restenosis within previously stented AV groove Cfx with 3.0x37m Taxus DES (previous stents were Promus) (Dr. JAdora Fridge   CARDIAC CATHETERIZATION  05/11/2011   preserved LV function, 40% mid LAD stenosis, 30-40% narrowing proximal to stented semgnet of prox Cfx, patent mid RCA stent with smooth 20% narrowing in distal RCA (Dr. TCorky [name]   CARDIAC CATHETERIZATION  12/15/2011   PCI & stenting of proximal & mid Cfx with DES - 3.0x160min proximal, 3.0x1566mn mid (Dr. J. Adora Fridge [place] 06/10/2015   Procedure: Left Heart Cath and Coronary Angiography;  Surgeon: DanJolaine ArtistD;  Location: [facility] [place] [facility];  Service: Cardiovascular;  Laterality: N/A;   CARDIAC CATHETERIZATION  04/93/26/7124cardiometabolic testing  08/13/78/9983good exercise effort, peak VO2 79% predicted with normal VO2 HR curves (mild deconditioning)   COLONOSCOPY  12/2012   diminutive hyperplastic sigmoid poyp so repeat routine 2024   CORONARY BALLOON ANGIOPLASTY N/A 10/25/2018   Procedure: CORONARY BALLOON ANGIOPLASTY;  Surgeon: VarJettie BoozeD;  Location: [facility] [place] [facility];  Service: Cardiovascular;  Laterality: N/A;   CORONARY BALLOON ANGIOPLASTY N/A 09/29/2019  Procedure: CORONARY BALLOON ANGIOPLASTY;  Surgeon: Nelva Bush, MD;  Location: [place] [facility] LAB;  Service: Cardiovascular;  Laterality: N/A;   EXCISIONAL HEMORRHOIDECTOMY  1984   INTRAVASCULAR ULTRASOUND/IVUS N/A 09/29/2019   Procedure: Intravascular Ultrasound/IVUS;  Surgeon: Nelva Bush, MD;  Location: [place] CV LAB;  Service: Cardiovascular;  Laterality: N/A;   LEFT HEART CATH AND CORONARY ANGIOGRAPHY N/A 10/25/2018   Procedure: LEFT HEART CATH AND CORONARY ANGIOGRAPHY;  Surgeon: Jettie Booze, MD;  Location: [place] [facility] LAB;  Service: Cardiovascular;  Laterality: N/A;   LEFT HEART CATH AND CORONARY ANGIOGRAPHY N/A 09/29/2019   Procedure: LEFT  HEART CATH AND CORONARY ANGIOGRAPHY;  Surgeon: Nelva Bush, MD;  Location: [place] [facility] LAB;  Service: Cardiovascular;  Laterality: N/A;   LEFT HEART CATHETERIZATION WITH CORONARY ANGIOGRAM N/A 05/11/2011   Procedure: LEFT HEART CATHETERIZATION WITH CORONARY ANGIOGRAM;  Surgeon: Troy Sine, MD;  Location: [facility] CATH LAB;  Service: Cardiovascular;  Laterality: N/A;  Possible percutaneous coronary intervention, possible IVUS   LEFT HEART CATHETERIZATION WITH CORONARY ANGIOGRAM N/A 12/15/2011   Procedure: LEFT HEART CATHETERIZATION WITH CORONARY ANGIOGRAM;  Surgeon: Lorretta Harp, MD;  Location: [facility] CATH LAB;  Service: Cardiovascular;  Laterality: N/A;   LEFT HEART CATHETERIZATION WITH CORONARY ANGIOGRAM N/A 09/05/2014   Procedure: LEFT HEART CATHETERIZATION WITH CORONARY ANGIOGRAM;  Surgeon: Burnell Blanks, MD;  Location: [facility] CATH LAB;  Service: Cardiovascular;  Laterality: N/A;   LIPOMA EXCISION     back of the head   NM MYOCAR PERF WALL MOTION  02/2012   lexiscan myoview; mild perfusion defect in mid inferolateral & basal inferolateral region (infarct/scar); EF 52%, abnormal but ow risk scan   PERCUTANEOUS CORONARY STENT INTERVENTION (PCI-S)  09/05/2014   Procedure: PERCUTANEOUS CORONARY STENT INTERVENTION (PCI-S);  Surgeon: Burnell Blanks, MD;  Location: [facility] CATH LAB;  Service: Cardiovascular;;   TONSILLECTOMY     Family History:  Family History  Problem Relation Age of Onset   Leukemia Mother    Prostate cancer Father    Cancer Brother    Coronary artery disease Paternal Grandmother    Cancer Paternal Grandfather    Migraines Neg Hx    Headache Neg Hx    Family Psychiatric History:   Older sister, brother and mother -depression. Alcoholism "runs in the family" Oldest sister with attempted suicide (at least twice) Uncle with completed suicide [place] grandmother completed suicide Social History:  Social History   Substance and Sexual Activity  Alcohol Use Not  Currently   Comment: 2 bottles of wine on 12/08/2019 (one occurrence)     Social History   Substance and Sexual Activity  Drug Use Not Currently   Types: Cocaine   Comment: last use several months ago    Social History   Socioeconomic History   Marital status: Widowed    Spouse name: Not on file   Number of children: 2   Years of education: GED   Highest education level: Not on file  Occupational History   Not on file  Tobacco Use   Smoking status: Former    Packs/day: 1.00    Years: 10.00    Pack years: 10.00    Types: Cigarettes    Quit date: 10/07/2018    Years since quitting: 2.7   Smokeless tobacco: Never  Vaping Use   Vaping Use: Never used  Substance and Sexual Activity   Alcohol use: Not Currently    Comment: 2 bottles of wine on 12/08/2019 (one occurrence)  Drug use: Not Currently    Types: Cocaine    Comment: last use several months ago   Sexual activity: Not Currently  Other Topics Concern   Not on file  Social History Narrative   ** Merged History Encounter **       Social Determinants of Health   Financial Resource Strain: Not on file  Food Insecurity: Not on file  Transportation Needs: Not on file  Physical Activity: Not on file  Stress: Not on file  Social Connections: Not on file   SDOH:  SDOH Screenings   Alcohol Screen: Medium Risk   Last Alcohol Screening Score (AUDIT): 27  Depression (PHQ2-9): Medium Risk   PHQ-2 Score: 22  Financial Resource Strain: Not on file  Food Insecurity: Not on file  Housing: Not on file  Physical Activity: Not on file  Social Connections: Not on file  Stress: Not on file  Tobacco Use: Medium Risk   Smoking Tobacco Use: Former   Smokeless Tobacco Use: Never   Passive Exposure: Not on file  Transportation Needs: Not on file    Tobacco Cessation:  N/A, patient does not currently use tobacco products  Current Medications:  Current Facility-Administered Medications  Medication Dose Route Frequency  Provider Last Rate Last Admin   acetaminophen (TYLENOL) tablet 650 mg  650 mg Oral Q6H PRN [place], Kenneth   650 mg at 07/08/21 1737   alum & mag hydroxide-simeth (MAALOX/MYLANTA) 200-200-20 MG/5ML suspension 30 mL  30 mL Oral Q4H PRN D[place]ill [place], Kenneth       amLODipine (NORVASC) tablet 10 mg  10 mg Oral Daily Ival Bible, MD   10 mg at 07/10/21 5732   ARIPiprazole (ABILIFY) tablet 2 mg  2 mg Oral Daily Ival Bible, MD   2 mg at 07/10/21 2025   aspirin EC tablet 81 mg  81 mg Oral Daily Ival Bible, MD   81 mg at 07/10/21 0904   buPROPion (WELLBUTRIN XL) 24 hr tablet 150 mg  150 mg Oral Daily Ival Bible, MD   150 mg at 07/10/21 4270   clopidogrel (PLAVIX) tablet 75 mg  75 mg Oral Daily Ival Bible, MD   75 mg at 07/10/21 6237   hydrOXYzine (ATARAX) tablet 25 mg  25 mg Oral TID PRN [place]ill [place], Kenneth       magnesium hydroxide (MILK OF MAGNESIA) suspension 30 mL  30 mL Oral Daily PRN D[place]ill Center, Kenneth   30 mL at 07/08/21 1001   metFORMIN (GLUCOPHAGE) tablet 500 mg  500 mg Oral BID WC Ival Bible, MD   500 mg at 07/10/21 0904   metoprolol tartrate (LOPRESSOR) tablet 50 mg  50 mg Oral BID Ival Bible, MD   50 mg at 07/09/21 2125   traZODone (DESYREL) tablet 50 mg  50 mg Oral QHS Ival Bible, MD   50 mg at 07/09/21 2124   Current Outpatient Medications  Medication Sig Dispense Refill   Accu-Chek Softclix Lancets lancets SMARTSIG:1 Topical 4 Times Daily     amLODipine (NORVASC) 10 MG tablet Take 1 tablet by mouth once daily (Patient taking differently: Take 10 mg by mouth daily.) 90 tablet 0   aspirin EC 81 MG EC tablet Take 1 tablet (81 mg total) by mouth daily. (May buy from over the counter): Swallow whole for heart health 30 tablet 0   blood glucose meter kit and supplies KIT Dispense based on patient and insurance  preference. Use up to four times daily as directed. (FOR ICD-9 250.00, 250.01). 1 each 0   Blood Pressure  Monitor KIT 1 kit by Does not apply route daily. 1 kit 0   clopidogrel (PLAVIX) 75 MG tablet Take 1 tablet (75 mg total) by mouth daily. 90 tablet 1   EPINEPHrine 0.3 mg/0.3 mL IJ SOAJ injection Inject 0.3 mg into the muscle as needed for anaphylaxis. 1 each 2   glucose blood (ACCU-CHEK GUIDE) test strip Use as instructed 300 each 3   LIVALO 4 MG TABS Take 1 tablet (4 mg total) by mouth daily. (Patient taking differently: Take 4 mg by mouth daily.) 90 tablet 3   metFORMIN (GLUCOPHAGE) 500 MG tablet TAKE 1 TABLET BY MOUTH TWICE DAILY WITH A MEAL (Patient taking differently: Take 500 mg by mouth 2 (two) times daily with a meal.) 180 tablet 2   metoprolol tartrate (LOPRESSOR) 100 MG tablet Take 0.5 tablets (50 mg total) by mouth 2 (two) times daily. 180 tablet 3   nitroGLYCERIN (NITROSTAT) 0.4 MG SL tablet Place 1 tablet (0.4 mg total) under the tongue every 5 (five) minutes x 3 doses as needed for chest pain. 25 tablet 3   Omega-3 Fatty Acids (FISH OIL PO) Take 1 capsule by mouth daily.     SF 5000 PLUS 1.1 % CREA dental cream Place 1 application onto teeth at bedtime as needed (For teeth sensitivity).     sildenafil (VIAGRA) 100 MG tablet Take 1 tablet (100 mg total) by mouth daily as needed. (Patient taking differently: Take 100 mg by mouth daily as needed for erectile dysfunction.) 10 tablet 5   Testosterone (ANDROGEL PUMP) 20.25 MG/ACT (1.62%) GEL Place 1 Pump onto the skin daily. 75 g 2    PTA Medications: (Not in a hospital admission)   Musculoskeletal  Strength & Muscle Tone: within normal limits Gait & Station: normal Patient leans: N/A  Psychiatric Specialty Exam  Presentation  General Appearance: Appropriate for Environment; Casual; Fairly Groomed  Eye Contact:Good  Speech:Clear and Coherent; Normal Rate  Speech Volume:Normal  Handedness:Right   Mood and Affect  Mood:-- ("good")  Affect:-- (bright)   Thought Process  Thought Processes:Coherent; Goal Directed;  Linear  Descriptions of Associations:Intact  Orientation:Full (Time, Place and Person)  Thought Content:WDL; Logical     Hallucinations:Hallucinations: None  Ideas of Reference:None  Suicidal Thoughts:Suicidal Thoughts: No  Homicidal Thoughts:Homicidal Thoughts: No   Sensorium  Memory:Immediate Good; Recent Good; Remote Good  Judgment:Good  Insight:Good   Executive Functions  Concentration:Good  Attention Span:Good  [place] of Knowledge:Good  Language:Good   Psychomotor Activity  Psychomotor Activity:Psychomotor Activity: Normal   Assets  Assets:Communication Skills; Desire for Improvement; Social Support; Resilience   Sleep  Sleep:Sleep: Fair   No data recorded  Physical Exam    Physical Exam Constitutional:      Appearance: Normal appearance. He is normal weight.  HENT:     Head: Normocephalic and atraumatic.  Eyes:     Conjunctiva/sclera: Conjunctivae normal.  Pulmonary:     Effort: Pulmonary effort is normal.  Neurological:     General: No focal deficit present.     Mental Status: He is alert and oriented to person, place, and time.  Psychiatric:        Attention and Perception: Attention and perception normal.        Speech: Speech normal.        Behavior: Behavior normal. Behavior is cooperative.  Thought Content: Thought content normal.    Review of Systems  Constitutional:  Negative for chills and fever.  HENT:  Negative for hearing loss.   Eyes:  Negative for discharge and redness.  Respiratory:  Negative for cough.   Cardiovascular:  Negative for chest pain.  Gastrointestinal:  Negative for abdominal pain.  Musculoskeletal:  Negative for back pain.       Chronic LBP  Neurological:  Positive for tingling. Negative for headaches.       Hand and foot tingling after drinking sugary drinks  Blood pressure 120/82, pulse 83, temperature 97.7 F (36.5 C), temperature source Tympanic, resp. rate 18, SpO2 100 %.  There is no height or weight on file to calculate BMI.  Demographic Factors:  Male and Living alone  Loss Factors: Loss of significant relationship  Historical Factors: Prior suicide attempts and Impulsivity  Risk Reduction Factors:   Sense of responsibility to family, Religious beliefs about death, Positive social support, and Positive coping skills or problem solving skills  Continued Clinical Symptoms:  Depression:   Recent sense of peace/wellbeing Alcohol/Substance Abuse/Dependencies  Cognitive Features That Contribute To Risk:  None    Suicide Risk:  Minimal: No identifiable suicidal ideation.  Patients presenting with no risk factors but with morbid ruminations; may be classified as minimal risk based on the severity of the depressive symptoms  Plan Of Care/Follow-up recommendations:  Activity:  as toelrated Diet:  regular Other:     Take all medications as prescribed by his/her mental healthcare provider. Report any adverse effects and or reactions from the medicines to your outpatient provider promptly. Do not engage in alcohol and or illegal drug use while on prescription medicines. In the event of worsening symptoms, call the crisis hotline, 911 and or go to the nearest ED for appropriate evaluation and treatment of symptoms. follow-up with your primary care provider for your other medical issues, concerns and or health care needs.   Allergies as of 07/10/2021       Reactions   Bee Venom Anaphylaxis, Hives   Gadolinium Hives, Itching, Swelling, Other (See Comments)   Swelling of eyes after receiving MR contrast. 13 hr prep recommended. Pt developed large hive and swelling under right eye after contrast, given 70m iv benadryl, pt will need premed before gadolinium//lh   Shellfish Allergy Anaphylaxis, Hives   Statins Other (See Comments)   Myalgias. Tolerating livalo. Pain   Testosterone Cypionate Other (See Comments)   Testerone Injection --Increased breast  tissue         Medication List     TAKE these medications    Accu-Chek Guide test strip Generic drug: glucose blood Use as instructed   Accu-Chek Softclix Lancets lancets SMARTSIG:1 Topical 4 Times Daily   amLODipine 10 MG tablet Commonly known as: NORVASC Take 1 tablet (10 mg total) by mouth daily.   ARIPiprazole 2 MG tablet Commonly known as: ABILIFY Take 1 tablet (2 mg total) by mouth daily. Start taking on: July 11, 2021   aspirin 81 MG EC tablet Take 1 tablet (81 mg total) by mouth daily. (May buy from over the counter): Swallow whole for heart health   blood glucose meter kit and supplies Kit Dispense based on patient and insurance preference. Use up to four times daily as directed. (FOR ICD-9 250.00, 250.01).   Blood Pressure Monitor Kit 1 kit by Does not apply route daily.   buPROPion 150 MG 24 hr tablet Commonly known as: WELLBUTRIN XL Take 1 tablet (150  mg total) by mouth daily. Start taking on: July 11, 2021   clopidogrel 75 MG tablet Commonly known as: PLAVIX Take 1 tablet (75 mg total) by mouth daily.   EPINEPHrine 0.3 mg/0.3 mL Soaj injection Commonly known as: EPI-PEN Inject 0.3 mg into the muscle as needed for anaphylaxis.   FISH OIL PO Take 1 capsule by mouth daily.   hydrOXYzine 25 MG tablet Commonly known as: ATARAX Take 1 tablet (25 mg total) by mouth 3 (three) times daily as needed for anxiety.   Livalo 4 MG Tabs Generic drug: Pitavastatin Calcium Take 1 tablet (4 mg total) by mouth daily.   metFORMIN 500 MG tablet Commonly known as: GLUCOPHAGE Take 1 tablet (500 mg total) by mouth 2 (two) times daily with a meal.   metoprolol tartrate 50 MG tablet Commonly known as: LOPRESSOR Take 1 tablet (50 mg total) by mouth 2 (two) times daily. What changed: medication strength   nitroGLYCERIN 0.4 MG SL tablet Commonly known as: NITROSTAT Place 1 tablet (0.4 mg total) under the tongue every 5 (five) minutes x 3 doses as needed for  chest pain.   SF 5000 Plus 1.1 % Crea dental cream Generic drug: sodium fluoride Place 1 application onto teeth at bedtime as needed (For teeth sensitivity).   sildenafil 100 MG tablet Commonly known as: VIAGRA Take 1 tablet (100 mg total) by mouth daily as needed. What changed: reasons to take this   Testosterone 20.25 MG/ACT (1.62%) Gel Commonly known as: AndroGel Pump Place 1 Pump onto the skin daily.   traZODone 50 MG tablet Commonly known as: DESYREL Take 1 tablet (50 mg total) by mouth at bedtime.       Patient provided with prescription for 30 days of medications he received at the [facility].  Recommended patient follow-up with his provider at the [facility] for further psychiatric care-see resources placed in AVS for additional details regarding outpatient follow up  Disposition: self care  Ival Bible, MD 07/10/2021, 4:25 PM

## 2021-07-10 NOTE — ED Notes (Signed)
Pt is at nursing desk asking for magazines to read. Pt calm and cooperative. No c/o pain or distress. Will continue  to monitor for safety

## 2021-07-10 NOTE — Progress Notes (Signed)
Pt is in the dinning area watching TV and interacting staff. No distress noted. No complaints expressed. Monitoring for pt's safety.

## 2021-07-10 NOTE — Clinical Social Work Psych Note (Signed)
CSW Discharge Note  [name] reports feeling "very good" today. He denied having any SI, HI or AVH. He also denied having any increasing anxiety or depressive symptoms. [name] shared that he believes he is ready for discharge.   [name] plans to return home, alone. He requested assistance with transportation.   [name]'s follow up appointments and instructions have been charted in his AVS.   RN staff will arrange Safe Transport.   CSW signing off.   Radonna Ricker, MSW, LCSW Clinical Education officer, museum ([place]) [facility]

## 2021-07-10 NOTE — Progress Notes (Signed)
SPIRITUALITY GROUP NOTE  Spirituality group facilitated by Simone Curia, MDiv, [place].  Group Description: Group focused on topic of hope. Patients participated in facilitated discussion around topic, connecting with one another around experiences and definitions for hope. Group members engaged with visual explorer photos, reflecting on what hope looks like for them today. Group engaged in discussion around how their definitions of hope are present today in hospital.  Modalities: Psycho-social ed, Adlerian, Narrative, [place]  Patient Progress: Actively engaged in group discussion with facilitator and other group members.  Reflected on his faith as a source of hope.  Connected with other group members about his positive experiences in [place] and encouraged being around others who normalize the journey.  Spoke of being intentional about being present in the moment and encouraged others in group by expressing gratefulness that they spent the day not doing "the destructive things we normally would be doing."

## 2021-07-10 NOTE — Discharge Summary (Signed)
[name] to be D/C'd Home per MD order. Discussed with the patient and all questions fully answered. An After Visit Summary was printed and given to the patient. Medication scripts were also given to patient. Patient escorted out and D/C home via private auto.  [name]  07/10/2021 5:14 PM

## 2021-07-10 NOTE — ED Provider Notes (Signed)
Behavioral Health Progress Note  Date and Time: 07/10/2021 3:05 PM Name: [name] MRN:  [id number]  Subjective:   61 year old male with history of anxiety and depression who presented to the [facility] on 1/9 at the direction of his outpatient provider NP Burt Ek for assessment of suicidal thoughts and worsening depression. Patient had reported that he stopped taking his psychiatric medications (wellbutrin and abilify)  severeal months ago d/t improved mood but that for the last ~1.5 months has been having a worsening mood and stopped taking all of his medications. UDS negative; etoh negative  Patient seen and chart reviewed.  Patient has been medication compliant and he has been appropriate with staff and peers on the unit.  Patient interviewed this morning.  Patient describes his mood as "good".  Patient states that he had some difficulty sleeping well but attributes this to staff being allowed overnight.  Patient denies SI/HI/AVH.  Patient discusses at length that he enjoys the groups that he is currently sitting and waiting for the next group to start.  Patient states that he is starting to feel more motivated and that "I am thinking about going home and cleaning up the kitchen cabinet and refrigerator".  Discussed disposition, patient expresses he will likely feel ready for discharge tomorrow.  Patient denies side effects to medications and has been tolerating medications well.  Mom tingling in his hands and his feet after he drinks sugary beverages but otherwise denies all physical complaints.  Patient states that this is not uncommon for him to experience this due to his diabetes. Patient was given the opportunity to ask questions and  All questions answered. Patient verbalized understanding regarding plan of care.     Diagnosis:  Final diagnoses:  Severe episode of recurrent major depressive disorder, without psychotic features ([place])    Total Time spent with patient: 20  minutes  Past Psychiatric History: AUD, in remission; MDD, anxiety Past Medical History:  Past Medical History:  Diagnosis Date   Alcohol dependence ([place])    Allergies    Arthritis    Chest pain    Chronic lower back pain    Chronic pain of right wrist    Coronary artery disease    a. Multiple prior caths/PCI. Cath 2013 with possible spasm of RCA, 70% ISR of mid LCx with subsequent DES to mLCx and prox LCX. b. H/o microvascular angina. c. Recurrent angina 08/2014 - s/p PTCA/DES to prox Cx, PTCA/CBA to OM1.  c. LHC 06/10/15 with patent stents and some ISR in LCX and OM-1 that was not flow limiting --> Rx    Dyslipidemia    a. Intolerant to many statins except tolerating Livalo.   GERD (gastroesophageal reflux disease)    H/O cardiac catheterization 10/25/2018   Heart attack ([place])    Hypertension    Myocardial infarction [facility]) ~ 2010   S/P angioplasty with stent, DES, to proximal and mid LCX 12/15/11 12/15/2011   S/P foot surgery, right 04/2021   Shoulder pain    Stroke ([place])    pt. reports had a stroke around time of MI 2010   Type II diabetes mellitus ([place])    Unstable angina [facility])     Past Surgical History:  Procedure Laterality Date   CARDIAC CATHETERIZATION  06/15/2002   LAD with prox 40% stenosis, norma L main, Cfx with 25% lesion, RCA with long mid 25% stenosis (Dr. Vita Barley)   CARDIAC CATHETERIZATION  04/01/2010   normal L main, LAD wit mild stenosis,  L Cfx with 70% in-stent restenosis, RCA with 70% in-stent restenosis, LVEF >60% (Dr. K. Mali Hilty) - cutting ballon arthrectomy to RCA & Cfx (Dr. Rockne Menghini)   CARDIAC CATHETERIZATION  08/25/2010   preserved global LV contractility; multivessel CAD, diffuse 90-95% in-stent restenosis in prox placed Cfx stent - cutting balloon arthrectomy in Cfx with multiple dilatations 90-95% to 0% stenosis (Dr. Corky Downs)   CARDIAC CATHETERIZATION  01/26/2011   PCI & stenting of aggresive in-stent restenosis within previously stented AV groove  Cfx with 3.0x62m Taxus DES (previous stents were Promus) (Dr. JAdora Fridge   CARDIAC CATHETERIZATION  05/11/2011   preserved LV function, 40% mid LAD stenosis, 30-40% narrowing proximal to stented semgnet of prox Cfx, patent mid RCA stent with smooth 20% narrowing in distal RCA (Dr. TCorky Downs   CARDIAC CATHETERIZATION  12/15/2011   PCI & stenting of proximal & mid Cfx with DES - 3.0x172min proximal, 3.0x1576mn mid (Dr. J. Adora Fridge CARBolingA 06/10/2015   Procedure: Left Heart Cath and Coronary Angiography;  Surgeon: DanJolaine ArtistD;  Location: [facility] [place] [facility];  Service: Cardiovascular;  Laterality: N/A;   CARDIAC CATHETERIZATION  04/75/91/6384cardiometabolic testing  08/15/63/9935good exercise effort, peak VO2 79% predicted with normal VO2 HR curves (mild deconditioning)   COLONOSCOPY  12/2012   diminutive hyperplastic sigmoid poyp so repeat routine 2024   CORONARY BALLOON ANGIOPLASTY N/A 10/25/2018   Procedure: CORONARY BALLOON ANGIOPLASTY;  Surgeon: VarJettie BoozeD;  Location: [facility] [place] [facility];  Service: Cardiovascular;  Laterality: N/A;   CORONARY BALLOON ANGIOPLASTY N/A 09/29/2019   Procedure: CORONARY BALLOON ANGIOPLASTY;  Surgeon: EndNelva BushD;  Location: [facility] [place] [facility];  Service: Cardiovascular;  Laterality: N/A;   EXCISIONAL HEMORRHOIDECTOMY  1984   INTRAVASCULAR ULTRASOUND/IVUS N/A 09/29/2019   Procedure: Intravascular Ultrasound/IVUS;  Surgeon: EndNelva BushD;  Location: [facility] [place] [facility];  Service: Cardiovascular;  Laterality: N/A;   LEFT HEART CATH AND CORONARY ANGIOGRAPHY N/A 10/25/2018   Procedure: LEFT HEART CATH AND CORONARY ANGIOGRAPHY;  Surgeon: VarJettie BoozeD;  Location: [facility] [place] [facility];  Service: Cardiovascular;  Laterality: N/A;   LEFT HEART CATH AND CORONARY ANGIOGRAPHY N/A 09/29/2019   Procedure: LEFT HEART CATH AND CORONARY ANGIOGRAPHY;  Surgeon: EndNelva BushD;  Location: [facility] [place] [facility];  Service:  Cardiovascular;  Laterality: N/A;   LEFT HEART CATHETERIZATION WITH CORONARY ANGIOGRAM N/A 05/11/2011   Procedure: LEFT HEART CATHETERIZATION WITH CORONARY ANGIOGRAM;  Surgeon: ThoTroy SineD;  [facility];  Service: Cardiovascular;  Laterality: N/A;  Possible percutaneous coronary intervention, possible IVUS   LEFT HEART CATHETERIZATION WITH CORONARY ANGIOGRAM N/A 12/15/2011   Procedure: LEFT HEART CATHETERIZATION WITH CORONARY ANGIOGRAM;  Surgeon: JonLorretta HarpD;  [facility];  Service: Cardiovascular;  Laterality: N/A;   LEFT HEART CATHETERIZATION WITH CORONARY ANGIOGRAM N/A 09/05/2014   Procedure: LEFT HEART CATHETERIZATION WITH CORONARY ANGIOGRAM;  Surgeon: ChrBurnell BlanksD;  [facility];  Service: Cardiovascular;  Laterality: N/A;   LIPOMA EXCISION     back of the head   NM MYOCAR PERF WALL MOTION  02/2012   lexiscan myoview; mild perfusion defect in mid inferolateral & basal inferolateral region (infarct/scar); EF 52%, abnormal but ow risk scan   PERCUTANEOUS CORONARY STENT INTERVENTION (PCI-S)  09/05/2014   Procedure: PERCUTANEOUS CORONARY STENT INTERVENTION (PCI-S);  Surgeon: ChrBurnell BlanksD;  [facility];  Service: Cardiovascular;;  TONSILLECTOMY     Family History:  Family History  Problem Relation Age of Onset   Leukemia Mother    Prostate cancer Father    Cancer Brother    Coronary artery disease Paternal Grandmother    Cancer Paternal Grandfather    Migraines Neg Hx    Headache Neg Hx    Family Psychiatric  History:  Older sister, brother and mother -depression. Alcoholism "runs in the family" Oldest sister with attempted suicide (at least twice) Uncle with completed suicide [place] grandmother completed suicide Social History:  Social History   Substance and Sexual Activity  Alcohol Use Not Currently   Comment: 2 bottles of wine on 12/08/2019 (one occurrence)     Social History   Substance and  Sexual Activity  Drug Use Not Currently   Types: Cocaine   Comment: last use several months ago    Social History   Socioeconomic History   Marital status: Widowed    Spouse name: Not on file   Number of children: 2   Years of education: GED   Highest education level: Not on file  Occupational History   Not on file  Tobacco Use   Smoking status: Former    Packs/day: 1.00    Years: 10.00    Pack years: 10.00    Types: Cigarettes    Quit date: 10/07/2018    Years since quitting: 2.7   Smokeless tobacco: Never  Vaping Use   Vaping Use: Never used  Substance and Sexual Activity   Alcohol use: Not Currently    Comment: 2 bottles of wine on 12/08/2019 (one occurrence)   Drug use: Not Currently    Types: Cocaine    Comment: last use several months ago   Sexual activity: Not Currently  Other Topics Concern   Not on file  Social History Narrative   ** Merged History Encounter **       Social Determinants of Health   Financial Resource Strain: Not on file  Food Insecurity: Not on file  Transportation Needs: Not on file  Physical Activity: Not on file  Stress: Not on file  Social Connections: Not on file   SDOH:  SDOH Screenings   Alcohol Screen: Medium Risk   Last Alcohol Screening Score (AUDIT): 27  Depression (PHQ2-9): Medium Risk   PHQ-2 Score: 22  Financial Resource Strain: Not on file  Food Insecurity: Not on file  Housing: Not on file  Physical Activity: Not on file  Social Connections: Not on file  Stress: Not on file  Tobacco Use: Medium Risk   Smoking Tobacco Use: Former   Smokeless Tobacco Use: Never   Passive Exposure: Not on file  Transportation Needs: Not on file   Additional Social History:    Pain Medications: See MAR Prescriptions: See MAR Over the Counter: See MAR History of alcohol / drug use?: Yes Longest period of sobriety (when/how long): 4 years and 1 month Negative Consequences of Use: Financial, Personal relationships Withdrawal  Symptoms: Blackouts                    Sleep: Fair  Appetite:  Poor  Current Medications:  Current Facility-Administered Medications  Medication Dose Route Frequency Provider Last Rate Last Admin   acetaminophen (TYLENOL) tablet 650 mg  650 mg Oral Q6H PRN Derrill Center, NP   650 mg at 07/08/21 1737   alum & mag hydroxide-simeth (MAALOX/MYLANTA) 200-200-20 MG/5ML suspension 30 mL  30 mL Oral Q4H PRN Lewis,  Marcy Panning, NP       amLODipine (NORVASC) tablet 10 mg  10 mg Oral Daily Ival Bible, MD   10 mg at 07/10/21 5277   ARIPiprazole (ABILIFY) tablet 2 mg  2 mg Oral Daily Ival Bible, MD   2 mg at 07/10/21 8242   aspirin EC tablet 81 mg  81 mg Oral Daily Ival Bible, MD   81 mg at 07/10/21 0904   buPROPion (WELLBUTRIN XL) 24 hr tablet 150 mg  150 mg Oral Daily Ival Bible, MD   150 mg at 07/10/21 3536   clopidogrel (PLAVIX) tablet 75 mg  75 mg Oral Daily Ival Bible, MD   75 mg at 07/10/21 1443   hydrOXYzine (ATARAX) tablet 25 mg  25 mg Oral TID PRN Derrill Center, NP       magnesium hydroxide (MILK OF MAGNESIA) suspension 30 mL  30 mL Oral Daily PRN Derrill Center, NP   30 mL at 07/08/21 1001   metFORMIN (GLUCOPHAGE) tablet 500 mg  500 mg Oral BID WC Ival Bible, MD   500 mg at 07/10/21 0904   metoprolol tartrate (LOPRESSOR) tablet 50 mg  50 mg Oral BID Ival Bible, MD   50 mg at 07/09/21 2125   traZODone (DESYREL) tablet 50 mg  50 mg Oral QHS Ival Bible, MD   50 mg at 07/09/21 2124   Current Outpatient Medications  Medication Sig Dispense Refill   Accu-Chek Softclix Lancets lancets SMARTSIG:1 Topical 4 Times Daily     amLODipine (NORVASC) 10 MG tablet Take 1 tablet by mouth once daily (Patient taking differently: Take 10 mg by mouth daily.) 90 tablet 0   aspirin EC 81 MG EC tablet Take 1 tablet (81 mg total) by mouth daily. (May buy from over the counter): Swallow whole for heart health 30 tablet 0   blood  glucose meter kit and supplies KIT Dispense based on patient and insurance preference. Use up to four times daily as directed. (FOR ICD-9 250.00, 250.01). 1 each 0   Blood Pressure Monitor KIT 1 kit by Does not apply route daily. 1 kit 0   clopidogrel (PLAVIX) 75 MG tablet Take 1 tablet (75 mg total) by mouth daily. 90 tablet 1   EPINEPHrine 0.3 mg/0.3 mL IJ SOAJ injection Inject 0.3 mg into the muscle as needed for anaphylaxis. 1 each 2   glucose blood (ACCU-CHEK GUIDE) test strip Use as instructed 300 each 3   LIVALO 4 MG TABS Take 1 tablet (4 mg total) by mouth daily. (Patient taking differently: Take 4 mg by mouth daily.) 90 tablet 3   metFORMIN (GLUCOPHAGE) 500 MG tablet TAKE 1 TABLET BY MOUTH TWICE DAILY WITH A MEAL (Patient taking differently: Take 500 mg by mouth 2 (two) times daily with a meal.) 180 tablet 2   metoprolol tartrate (LOPRESSOR) 100 MG tablet Take 0.5 tablets (50 mg total) by mouth 2 (two) times daily. 180 tablet 3   nitroGLYCERIN (NITROSTAT) 0.4 MG SL tablet Place 1 tablet (0.4 mg total) under the tongue every 5 (five) minutes x 3 doses as needed for chest pain. 25 tablet 3   Omega-3 Fatty Acids (FISH OIL PO) Take 1 capsule by mouth daily.     SF 5000 PLUS 1.1 % CREA dental cream Place 1 application onto teeth at bedtime as needed (For teeth sensitivity).     sildenafil (VIAGRA) 100 MG tablet Take 1 tablet (100 mg total) by mouth  daily as needed. (Patient taking differently: Take 100 mg by mouth daily as needed for erectile dysfunction.) 10 tablet 5   Testosterone (ANDROGEL PUMP) 20.25 MG/ACT (1.62%) GEL Place 1 Pump onto the skin daily. 75 g 2    Labs  Lab Results:  Admission on 07/07/2021  Component Date Value Ref Range Status   SARS Coronavirus 2 by RT PCR 07/07/2021 NEGATIVE  NEGATIVE Final   Comment: (NOTE) SARS-CoV-2 target nucleic acids are NOT DETECTED.  The SARS-CoV-2 RNA is generally detectable in upper respiratory specimens during the acute phase of  infection. The lowest concentration of SARS-CoV-2 viral copies this assay can detect is 138 copies/mL. A negative result does not preclude SARS-Cov-2 infection and should not be used as the sole basis for treatment or other patient management decisions. A negative result may occur with  improper specimen collection/handling, submission of specimen other than nasopharyngeal swab, presence of viral mutation(s) within the areas targeted by this assay, and inadequate number of viral copies(<138 copies/mL). A negative result must be combined with clinical observations, patient history, and epidemiological information. The expected result is Negative.  Fact Sheet for Patients:  EntrepreneurPulse.com.au  Fact Sheet for Healthcare Providers:  IncredibleEmployment.be  This test is no                          t yet approved or cleared by the [place] FDA and  has been authorized for detection and/or diagnosis of SARS-CoV-2 by FDA under an Emergency Use Authorization (EUA). This EUA will remain  in effect (meaning this test can be used) for the duration of the COVID-19 declaration under Section 564(b)(1) of the Act, 21 U.S.C.section 360bbb-3(b)(1), unless the authorization is terminated  or revoked sooner.       Influenza A by PCR 07/07/2021 NEGATIVE  NEGATIVE Final   Influenza B by PCR 07/07/2021 NEGATIVE  NEGATIVE Final   Comment: (NOTE) The Xpert Xpress SARS-CoV-2/FLU/RSV plus assay is intended as an aid in the diagnosis of influenza from Nasopharyngeal swab specimens and should not be used as a sole basis for treatment. Nasal washings and aspirates are unacceptable for Xpert Xpress SARS-CoV-2/FLU/RSV testing.  Fact Sheet for Patients: EntrepreneurPulse.com.au  Fact Sheet for Healthcare Providers: IncredibleEmployment.be  This test is not yet approved or cleared by the [place] FDA and has been  authorized for detection and/or diagnosis of SARS-CoV-2 by FDA under an Emergency Use Authorization (EUA). This EUA will remain in effect (meaning this test can be used) for the duration of the COVID-19 declaration under Section 564(b)(1) of the Act, 21 U.S.C. section 360bbb-3(b)(1), unless the authorization is terminated or revoked.  Performed at [facility], [place] [address] [place], [place] [address]    WBC 07/07/2021 4.8  4.0 - 10.5 K/uL Final   RBC 07/07/2021 5.00  4.22 - 5.81 MIL/uL Final   Hemoglobin 07/07/2021 13.9  13.0 - 17.0 g/dL Final   HCT 07/07/2021 41.7  39.0 - 52.0 % Final   MCV 07/07/2021 83.4  80.0 - 100.0 fL Final   MCH 07/07/2021 27.8  26.0 - 34.0 pg Final   MCHC 07/07/2021 33.3  30.0 - 36.0 g/dL Final   RDW 07/07/2021 12.3  11.5 - 15.5 % Final   Platelets 07/07/2021 229  150 - 400 K/uL Final   nRBC 07/07/2021 0.0  0.0 - 0.2 % Final   Neutrophils Relative % 07/07/2021 46  % Final   Neutro Abs 07/07/2021 2.3  1.7 -  7.7 K/uL Final   Lymphocytes Relative 07/07/2021 38  % Final   Lymphs Abs 07/07/2021 1.9  0.7 - 4.0 K/uL Final   Monocytes Relative 07/07/2021 9  % Final   Monocytes Absolute 07/07/2021 0.4  0.1 - 1.0 K/uL Final   Eosinophils Relative 07/07/2021 5  % Final   Eosinophils Absolute 07/07/2021 0.2  0.0 - 0.5 K/uL Final   Basophils Relative 07/07/2021 1  % Final   Basophils Absolute 07/07/2021 0.1  0.0 - 0.1 K/uL Final   Immature Granulocytes 07/07/2021 1  % Final   Abs Immature Granulocytes 07/07/2021 0.03  0.00 - 0.07 K/uL Final   Performed at [facility] [address], [place], [place] [address]   Sodium 07/07/2021 136  135 - 145 mmol/L Final   Potassium 07/07/2021 4.3  3.5 - 5.1 mmol/L Final   Chloride 07/07/2021 104  98 - 111 mmol/L Final   CO2 07/07/2021 24  22 - 32 mmol/L Final   Glucose, Bld 07/07/2021 144 (H)  70 - 99 mg/dL Final   Glucose reference range applies only to samples taken after fasting for at least 8 hours.   BUN  07/07/2021 15  6 - 20 mg/dL Final   Creatinine, Ser 07/07/2021 0.92  0.61 - 1.24 mg/dL Final   Calcium 07/07/2021 9.2  8.9 - 10.3 mg/dL Final   Total Protein 07/07/2021 6.6  6.5 - 8.1 g/dL Final   Albumin 07/07/2021 4.3  3.5 - 5.0 g/dL Final   AST 07/07/2021 24  15 - 41 U/L Final   ALT 07/07/2021 41  0 - 44 U/L Final   Alkaline Phosphatase 07/07/2021 51  38 - 126 U/L Final   Total Bilirubin 07/07/2021 0.7  0.3 - 1.2 mg/dL Final   GFR, Estimated 07/07/2021 >60  >60 mL/min Final   Comment: (NOTE) Calculated using the CKD-EPI Creatinine Equation (2021)    Anion gap 07/07/2021 8  5 - 15 Final   Performed at [facility], [place] [address], [place], [place] [address]   TSH 07/07/2021 3.372  0.350 - 4.500 uIU/mL Final   Comment: Performed by a 3rd Generation assay with a functional sensitivity of <=0.01 uIU/mL. Performed at [facility], [place] [address] [place], [place] [address]    Cholesterol 07/07/2021 144  0 - 200 mg/dL Final   Triglycerides 07/07/2021 45  <150 mg/dL Final   HDL 07/07/2021 44  >40 mg/dL Final   Total CHOL/HDL Ratio 07/07/2021 3.3  RATIO Final   VLDL 07/07/2021 9  0 - 40 mg/dL Final   LDL Cholesterol 07/07/2021 91  0 - 99 mg/dL Final   Comment:        Total Cholesterol/HDL:CHD Risk Coronary Heart Disease Risk Table                     Men   Women  1/2 Average Risk   3.4   3.3  Average Risk       5.0   4.4  2 X Average Risk   9.6   7.1  3 X Average Risk  23.4   11.0        Use the calculated Patient Ratio above and the CHD Risk Table to determine the patient's CHD Risk.        ATP III CLASSIFICATION (LDL):  <100     mg/dL   Optimal  100-129  mg/dL   Near or Above  Optimal  130-159  mg/dL   Borderline  160-189  mg/dL   High  >190     mg/dL   Very High Performed at [facility] [address], [place], [place] [address]    POC Amphetamine UR 07/07/2021 None Detected  NONE DETECTED (Cut Off Level 1000 ng/mL) Preliminary   POC  Secobarbital (BAR) 07/07/2021 None Detected  NONE DETECTED (Cut Off Level 300 ng/mL) Preliminary   POC Buprenorphine (BUP) 07/07/2021 None Detected  NONE DETECTED (Cut Off Level 10 ng/mL) Preliminary   POC Oxazepam (BZO) 07/07/2021 None Detected  NONE DETECTED (Cut Off Level 300 ng/mL) Preliminary   POC Cocaine UR 07/07/2021 None Detected  NONE DETECTED (Cut Off Level 300 ng/mL) Preliminary   POC Methamphetamine UR 07/07/2021 None Detected  NONE DETECTED (Cut Off Level 1000 ng/mL) Preliminary   POC Morphine 07/07/2021 None Detected  NONE DETECTED (Cut Off Level 300 ng/mL) Preliminary   POC Oxycodone UR 07/07/2021 None Detected  NONE DETECTED (Cut Off Level 100 ng/mL) Preliminary   POC Methadone UR 07/07/2021 None Detected  NONE DETECTED (Cut Off Level 300 ng/mL) Preliminary   POC Marijuana UR 07/07/2021 None Detected  NONE DETECTED (Cut Off Level 50 ng/mL) Preliminary   Alcohol, Ethyl (B) 07/08/2021 <10  <10 mg/dL Final   Comment: (NOTE) Lowest detectable limit for serum alcohol is 10 mg/dL.  For medical purposes only. Performed at [facility], [place] [address], [place],  [address]    Glucose-Capillary 07/08/2021 158 (H)  70 - 99 mg/dL Final   Glucose reference range applies only to samples taken after fasting for at least 8 hours.   Glucose-Capillary 07/09/2021 280 (H)  70 - 99 mg/dL Final   Glucose reference range applies only to samples taken after fasting for at least 8 hours.   Glucose-Capillary 07/09/2021 176 (H)  70 - 99 mg/dL Final   Glucose reference range applies only to samples taken after fasting for at least 8 hours.  Clinical Support on 07/07/2021  Component Date Value Ref Range Status   SARSCOV2ONAVIRUS 2 AG 07/07/2021 NEGATIVE  NEGATIVE Final   Comment: (NOTE) SARS-CoV-2 antigen NOT DETECTED.   Negative results are presumptive.  Negative results do not preclude SARS-CoV-2 infection and should not be used as the sole basis for treatment or other patient  management decisions, including infection  control decisions, particularly in the presence of clinical signs and  symptoms consistent with COVID-19, or in those who have been in contact with the virus.  Negative results must be combined with clinical observations, patient history, and epidemiological information. The expected result is Negative.  Fact Sheet for Patients: HandmadeRecipes.com.cy  Fact Sheet for Healthcare Providers: FuneralLife.at  This test is not yet approved or cleared by the [place] FDA and  has been authorized for detection and/or diagnosis of SARS-CoV-2 by FDA under an Emergency Use Authorization (EUA).  This EUA will remain in effect (meaning this test can be used) for the duration of  the COV                          ID-19 declaration under Section 564(b)(1) of the Act, 21 U.S.C. section 360bbb-3(b)(1), unless the authorization is terminated or revoked sooner.    Office Visit on 04/24/2021  Component Date Value Ref Range Status   Testosterone 04/24/2021 327  264 - 916 ng/dL Final   Comment: Adult male reference interval is based on a population of healthy nonobese  males (BMI <30) between 33 and 33 years old. [place], [place] [phone]. PMID: [id number].   Office Visit on 03/05/2021  Component Date Value Ref Range Status   Color, UA 03/05/2021 yellow  yellow Final   Clarity, UA 03/05/2021 clear  clear Final   Glucose, UA 03/05/2021 negative  negative mg/dL Final   Bilirubin, UA 03/05/2021 negative  negative Final   Ketones, POC UA 03/05/2021 negative  negative mg/dL Final   Spec Grav, UA 03/05/2021 1.020  1.010 - 1.025 Final   Blood, UA 03/05/2021 negative  negative Final   pH, UA 03/05/2021 6.0  5.0 - 8.0 Final   POC PROTEIN,UA 03/05/2021 negative  negative, trace Final   Urobilinogen, UA 03/05/2021 0.2  0.2 or 1.0 E.U./dL Final   Nitrite, UA 03/05/2021 Negative  Negative Final    Leukocytes, UA 03/05/2021 Negative  Negative Final   Hemoglobin A1C 03/05/2021 5.0  4.0 - 5.6 % Final   HbA1c POC (<> result, manual entry) 03/05/2021 5.0  4.0 - 5.6 % Final   HbA1c, POC (prediabetic range) 03/05/2021 5.0 (A)  5.7 - 6.4 % Final   HbA1c, POC (controlled diabetic ra* 03/05/2021 5.0  0.0 - 7.0 % Final   POC Glucose 03/05/2021 122 (A)  70 - 99 mg/dl Final   Cholesterol, Total 03/05/2021 152  100 - 199 mg/dL Final   Triglycerides 03/05/2021 99  0 - 149 mg/dL Final   HDL 03/05/2021 44  >39 mg/dL Final   VLDL Cholesterol Cal 03/05/2021 18  5 - 40 mg/dL Final   LDL Chol Calc (NIH) 03/05/2021 90  0 - 99 mg/dL Final   Chol/HDL Ratio 03/05/2021 3.5  0.0 - 5.0 ratio Final   Comment:                                   T. Chol/HDL Ratio                                             Men  Women                               1/2 Avg.Risk  3.4    3.3                                   Avg.Risk  5.0    4.4                                2X Avg.Risk  9.6    7.1                                3X Avg.Risk 23.4   11.0    Glucose 03/05/2021 103 (H)  65 - 99 mg/dL Final   BUN 03/05/2021 13  6 - 24 mg/dL Final   Creatinine, Ser 03/05/2021 0.92  0.76 - 1.27 mg/dL Final   eGFR 03/05/2021 96  >59 mL/min/1.73 Final   BUN/Creatinine Ratio 03/05/2021 14  9 - 20 Final   Sodium 03/05/2021 142  134 - 144 mmol/L Final  Potassium 03/05/2021 4.8  3.5 - 5.2 mmol/L Final   Chloride 03/05/2021 103  96 - 106 mmol/L Final   Calcium 03/05/2021 9.7  8.7 - 10.2 mg/dL Final   Total Protein 03/05/2021 7.2  6.0 - 8.5 g/dL Final   Albumin 03/05/2021 5.0 (H)  3.8 - 4.9 g/dL Final   Globulin, Total 03/05/2021 2.2  1.5 - 4.5 g/dL Final   Albumin/Globulin Ratio 03/05/2021 2.3 (H)  1.2 - 2.2 Final   Bilirubin Total 03/05/2021 0.4  0.0 - 1.2 mg/dL Final   Alkaline Phosphatase 03/05/2021 64  44 - 121 IU/L Final   AST 03/05/2021 24  0 - 40 IU/L Final  Office Visit on 02/12/2021  Component Date Value Ref Range Status    Glucose, UA 02/12/2021 Negative  Negative Final   Bilirubin, UA 02/12/2021 neg   Final   Ketones, UA 02/12/2021 neg   Final   Spec Grav, UA 02/12/2021 <=1.005 (A)  1.010 - 1.025 Final   Blood, UA 02/12/2021 neg   Final   pH, UA 02/12/2021 6.5  5.0 - 8.0 Final   Protein, UA 02/12/2021 Negative  Negative Final   Urobilinogen, UA 02/12/2021 0.2  0.2 or 1.0 E.U./dL Final   Nitrite, UA 02/12/2021 neg   Final   Leukocytes, UA 02/12/2021 Negative  Negative Final   Hemoglobin A1C 02/12/2021 6.2 (A)  4.0 - 5.6 % Final  Orders Only on 01/21/2021  Component Date Value Ref Range Status   Vit D, 25-Hydroxy 01/21/2021 44.9  30.0 - 100.0 ng/mL Final   Comment: Vitamin D deficiency has been defined by the Institute of Medicine and an Endocrine Society practice guideline as a level of serum 25-OH vitamin D less than 20 ng/mL (1,2). The Endocrine Society went on to further define vitamin D insufficiency as a level between 21 and 29 ng/mL (2). 1. IOM (Institute of Medicine). 2010. Dietary reference    intakes for calcium and D. [place]: The    [employer]. 2. Holick MF, [place], Bischoff-Ferrari HA, et al.    Evaluation, treatment, and prevention of vitamin D    deficiency: an Endocrine Society clinical practice    guideline. JCEM. 2011 Jul; 96(7):1911-30.    Testosterone 01/21/2021 140 (L)  264 - 916 ng/dL Final   Comment: Adult male reference interval is based on a population of healthy nonobese males (BMI <30) between 64 and 51 years old. [place], [place] [phone]. PMID: [id number].    Vitamin B-12 01/21/2021 537  232 - 1,245 pg/mL Final   Magnesium 01/21/2021 1.9  1.6 - 2.3 mg/dL Final    Blood Alcohol level:  Lab Results  Component Value Date   ETH <10 07/08/2021   ETH 18 (H) 67/05/4579    Metabolic Disorder Labs: Lab Results  Component Value Date   HGBA1C 5.0 03/05/2021   HGBA1C 5.0 03/05/2021   HGBA1C 5.0 (A) 03/05/2021   HGBA1C 5.0 03/05/2021    MPG 136.98 03/16/2020   MPG 151.33 12/12/2019   Lab Results  Component Value Date   PROLACTIN 13.0 03/17/2020   Lab Results  Component Value Date   CHOL 144 07/07/2021   TRIG 45 07/07/2021   HDL 44 07/07/2021   CHOLHDL 3.3 07/07/2021   VLDL 9 07/07/2021   LDLCALC 91 07/07/2021   LDLCALC 90 03/05/2021    Therapeutic Lab Levels: No results found for: LITHIUM No results found for: VALPROATE No components found for:  CBMZ  Physical Findings   AIMS  [place] Admission (Discharged) from OP Visit from 09/07/2019 in [place] [facility] Admission (Discharged) from OP Visit from 01/12/2019 in [place] [facility] Admission (Discharged) from OP Visit from 08/15/2018 in [place] [facility] Admission (Discharged) from OP Visit from 10/02/2017 in [place] [facility] Admission (Discharged) from 06/22/2017 in [place] [facility]  AIMS Total Score 0 0 0 0 0      AUDIT    Flowsheet Row Office Visit from 08/27/2020 in [facility] Admission (Discharged) from OP Visit from 03/18/2020 in [place] [facility] Admission (Discharged) from 12/10/2019 in [place] [facility] Admission (Discharged) from OP Visit from 09/07/2019 in [place] [facility] Admission (Discharged) from OP Visit from 01/12/2019 in [place] [facility]  Alcohol Use Disorder Identification Test Final Score (AUDIT) _0 0      GAD-7    Flowsheet Row Clinical Support from 07/07/2021 in [facility] Office Visit from 08/27/2020 in [facility]  Total GAD-7 Score 19 7      PHQ2-9    Flowsheet Row Clinical Support from 07/07/2021 in [facility] Office Visit from 04/24/2021 in [place] Office Visit from 03/05/2021 in [place] Video Visit from 01/20/2021 in [place] Office Visit from 08/29/2020 in [facility]  PHQ-2 Total Score 5 0 0 0 6  PHQ-9 Total Score 22 -- _1 Flowsheet Row ED from 07/07/2021 in [facility] Most recent reading at 07/07/2021  2:29 PM Clinical Support from 07/07/2021 in [facility] Most recent reading at 07/07/2021  9:57 AM ED from 01/12/2021 in [facility] Most recent reading at 01/12/2021 12:18 PM  C-SSRS RISK CATEGORY Low Risk Error: Q6 is Yes, you must answer 7 No Risk        Musculoskeletal  Strength & Muscle Tone: within normal limits Gait & Station: normal Patient leans: N/A  Psychiatric Specialty Exam  Presentation  General Appearance: Appropriate for Environment; Casual; Fairly Groomed  Eye Contact:Good  Speech:Clear and Coherent; Normal Rate  Speech Volume:Normal  Handedness:Right   Mood and Affect  Mood:-- ("good")  Affect:-- (bright)   Thought Process  Thought Processes:Coherent; Goal Directed; Linear  Descriptions of Associations:Intact  Orientation:Full (Time, Place and Person)  Thought Content:WDL; Logical     Hallucinations:Hallucinations: None  Ideas of Reference:None  Suicidal Thoughts:Suicidal Thoughts: No  Homicidal Thoughts:Homicidal Thoughts: No   Sensorium  Memory:Immediate Good; Recent Good; Remote Good  Judgment:Good  Insight:Good   Executive Functions  Concentration:Good  Attention Span:Good  [place] of Knowledge:Good  Language:Good   Psychomotor Activity  Psychomotor Activity:Psychomotor Activity: Normal   Assets  Assets:Communication Skills; Desire for Improvement; Social Support; Resilience   Sleep  Sleep:Sleep: Fair   No data recorded   Physical Exam  Physical  Exam Constitutional:      Appearance: Normal appearance. He is normal weight.  HENT:     Head: Normocephalic and atraumatic.  Eyes:     Conjunctiva/sclera: Conjunctivae normal.  Pulmonary:     Effort: Pulmonary effort is normal.  Neurological:     General: No focal deficit present.     Mental Status: He is alert and  oriented to person, place, and time.  Psychiatric:        Attention and Perception: Attention and perception normal.        Speech: Speech normal.        Behavior: Behavior normal. Behavior is cooperative.        Thought Content: Thought content normal.   Review of Systems  Constitutional:  Negative for chills and fever.  HENT:  Negative for hearing loss.   Eyes:  Negative for discharge and redness.  Respiratory:  Negative for cough.   Cardiovascular:  Negative for chest pain.  Gastrointestinal:  Negative for abdominal pain.  Musculoskeletal:  Negative for back pain.       Chronic LBP  Neurological:  Positive for tingling. Negative for headaches.       Hand and foot tingling after drinking sugary drinks   Psychiatric/Behavioral:  Positive for depression. Negative for hallucinations and substance abuse.   Blood pressure 120/82, pulse 83, temperature 97.7 F (36.5 C), temperature source Tympanic, resp. rate 18, SpO2 100 %. There is no height or weight on file to calculate BMI.  Treatment Plan Summary: 61 year old male with history of anxiety and depression who presented to the [facility] on 1/9 at the direction of his outpatient provider NP Burt Ek for assessment of suicidal thoughts and worsening depression. Patient had reported that he stopped taking his psychiatric medications (wellbutrin and abilify)  severeal months ago d/t improved mood but that for the last ~1.5 months has been having a worsening mood and stopped taking all of his medications. UDS negative, etoh neg  Patient reporting improvement in mood and affect appears brighter with re-initiation of  wellbutrin 150 mg XL and abilify 2 mg. (previously on 5 mg). Patient remains appropriate for continued treatment on the [facility]. Anticipate discharge tomorrow.  MDD -continue wellbutrin 150 mg XL -continue abilify 2 mg daily. Can consider increase if tolerates well/if there is indication/clinically approrpiate-historically did well on 5 mg.  -will schedule trazodone 50 mg qhs  T2DM -continue metformin 500 mg   CAD -continue home ASA 81 mg -continue home plavix 75 mg  Hypertension -continue home metoprolol 100 mg daily -continue home norvasc 10 mg   Dispo: ongoing. Likely discharge tomorrow Pending symptomatic improvement. Likely dc to home with f/u with Burt Ek. Could consider IOP/PHP if patient agreeable   Ival Bible, MD 07/10/2021 3:05 PM

## 2021-07-10 NOTE — ED Notes (Signed)
Pt eating breakfast 

## 2021-07-10 NOTE — Group Note (Signed)
Group Topic: Overcoming Obstacles  Group Date: 07/10/2021 Start Time: 1000 End Time: 1100 Facilitators: Adaline Sill D, NT  Department: [facility]  Number of Participants: 4  Group Focus: acceptance, coping skills, daily focus, family, and self-esteem Treatment Modality:  Behavior Modification Therapy Interventions utilized were clarification, problem solving, reality testing, and story telling Purpose: enhance coping skills, express feelings, increase insight, and   Name: [name] Date of Birth: 12-03-1960  MR: [id number]    Level of Participation: active Quality of Participation: attentive, cooperative, engaged, and supportive Interactions with others: gave feedback Mood/Affect: brightens with interaction and positive Triggers (if applicable): not being able to say how you feel.  Cognition: goal directed and insightful Progress: Minimal Response:  Plan: changes to discharge plan  Patients Problems:  Patient Active Problem List   Diagnosis Date Noted   Suicidal ideation 07/07/2021   Generalized anxiety disorder 07/07/2021   Other allergic rhinitis 09/09/2020   Adverse reaction to food, subsequent encounter 09/09/2020   History of bee sting allergy 09/09/2020   Alcohol dependence with alcohol-induced mood disorder ([place]) 03/19/2020   MDD (major depressive disorder), recurrent episode, severe ([place]) 03/18/2020   History of substance abuse ([place]) 01/04/2020   Severe 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 features ([place]) 12/10/2019   Tinea cruris 07/06/2019   Hemoglobin A1c less than 7.0% 07/06/2019   Muscle strain of wrist 04/05/2019   Pain in both wrists 04/05/2019   Anxiety 04/05/2019   Prediabetes 04/05/2019   MDD (major depressive disorder) 01/12/2019   Chest pain 10/25/2018   MDD (major depressive disorder), recurrent severe, without psychosis ([place]) 08/15/2018   Influenza A 07/17/2017   Lactic acidosis    Cough    Nausea and  vomiting    Severe episode of recurrent major depressive disorder, without psychotic features ([place])    MDD (major depressive disorder), single episode, severe with psychotic features ([place]) 06/27/2017   MDD (major depressive disorder), severe ([place]) 06/23/2017   Essential hypertension 01/13/2016   Unstable angina ([name])    ED (erectile dysfunction) 02/15/2014   Atypical chest pain 12/14/2011   Allergy to contrast media (used for diagnostic x-rays) 05/12/2011   Dyslipidemia, goal LDL below 70 05/09/2011   DM2 (diabetes mellitus, type 2) ([place]) 07/10/2010   CAD S/P percutaneous coronary angioplasty 07/10/2010

## 2021-07-18 ENCOUNTER — Encounter: Payer: Self-pay | Admitting: Internal Medicine

## 2021-07-18 ENCOUNTER — Other Ambulatory Visit: Payer: Self-pay

## 2021-07-18 ENCOUNTER — Ambulatory Visit (INDEPENDENT_AMBULATORY_CARE_PROVIDER_SITE_OTHER): Payer: Medicaid Other | Admitting: Internal Medicine

## 2021-07-18 VITALS — BP 122/88 | HR 58 | Ht 68.0 in | Wt 192.4 lb

## 2021-07-18 DIAGNOSIS — Z9861 Coronary angioplasty status: Secondary | ICD-10-CM

## 2021-07-18 DIAGNOSIS — E1159 Type 2 diabetes mellitus with other circulatory complications: Secondary | ICD-10-CM | POA: Diagnosis not present

## 2021-07-18 DIAGNOSIS — E785 Hyperlipidemia, unspecified: Secondary | ICD-10-CM

## 2021-07-18 DIAGNOSIS — I2510 Atherosclerotic heart disease of native coronary artery without angina pectoris: Secondary | ICD-10-CM

## 2021-07-18 DIAGNOSIS — I10 Essential (primary) hypertension: Secondary | ICD-10-CM

## 2021-07-18 MED ORDER — NITROGLYCERIN 0.4 MG SL SUBL: 0.4000 mg | SUBLINGUAL_TABLET | SUBLINGUAL | 3 refills | Status: DC | PRN

## 2021-07-18 MED ORDER — EZETIMIBE 10 MG PO TABS
10.0000 mg | ORAL_TABLET | Freq: Every day | ORAL | 3 refills | Status: DC
Start: 2021-07-18 — End: 2022-03-06

## 2021-07-18 NOTE — Progress Notes (Signed)
OFFICE NOTE  Chief Complaint:  Follow-up  Primary Care Physician: Vevelyn Francois, NP  HPI:  [name] a 61 year old gentleman who has a history of coronary disease, hypertension, dyslipidemia, diabetes type 2, and microvascular angina. He has had multiple PCIs, in-stent restenosis, but has been actually fairly stable since his last drug-eluting stent placement in June 2013. He had a nuclear stress test which showed some mild abnormalities and was considered low risk and has been treated medically.  He had chest pain in January 2014 and was seen at [facility]; however, no intervention was performed at that time. Recently he had some dyspepsia treated by a PPI which has resolved. He has had a little bit of weight gain and decreased exercise, which he is working on, and some dietary indiscretion. He also had a metabolic test in our office just on August 08, 2012, which showed good exercise effort, peak VO2 of 79% predicted with normal VO2 heart-rate curves indicating only mild deconditioning but no evidence of ischemia.   Since his last office visit he is adopted a vegetarian lifestyle and has managed to lose almost 20 pounds. He continues to exercise and his made great strides as far as his symptoms are concerned. He still has occasional episodes of angina which are nitrate responsive. He is concerned however about taking higher doses of Imdur as he read that it may not be effective in the long term.  He is inquiring about possibly coming off the medication if possible. He is no longer taking little low as he has made dietary changes and wishes to use a natural approach rather than statin medication.  I've reminded him that because he has significant coronary disease and multiple stents that statins are still indicated despite dietary changes.  [name] is doing well today. Denies any chest pain. He is interested in seeing what his cholesterol is coming down to now that he's been on a vegan and  diet. He is not on any cholesterol medications. He also reports some sexual dysfunction and is interested in Viagra.  01/13/2016  [name] returns today for follow-up. He is doing exceedingly well. He continues to exercise. He had switched to a vegan diet but recently had to introduce some dairy. His weight is down another 10 pounds and again he has no anginal symptoms at all. He is interested actually in coming off of Plavix today. It's been more than a year since his stent and since he's been free of angina I think that's very reasonable. Recent cholesterol testing shows marked improvement in his numbers including an LDL of 66 and total cholesterol of 132.  01/04/2017  [name] was seen today in follow-up. He continues to do well. He denies any new chest pain or worsening shortness of breath. He continues exercise regularly. Weight is down further from 175-168 and he is essentially at goal. Blood pressure is well-controlled today 114/82. His EKG is personally reviewed shows sinus bradycardia 50 with no ischemic changes. He recently had lab work showed total cholesterol 195, Trilisate 87, HDL 46 and LDL-C at 132. Of note his LDL-C was 66 last year. When queried about this he reported his insurance, he said that he did not need the medicine anymore and would not cover it. Clearly although he is optimize regarding diet and exercise his cholesterol remains elevated. His goal LDL-C is less than 70 given his coronary artery disease and he will not recheck goal without medication. Unfortunately he was intolerant  to a number of statins in the past including atorvastatin, simvastatin and rosuvastatin. He's also recently been complaining of some peripheral neuropathy. He is not on medication yet for this but reports numbness and tingling in his hands and feet. He would live and A1c however is now better controlled at 5.4.  11/15/2018  [name] returns today for follow-up.  He is complaining of some persistent chest  pain that seems to started after his recent coronary intervention.  In April he was seen in the hospital with recurrent chest pain.  He underwent left heart catheterization was found to have in-stent restenosis of the circumflex and OM branches which was intervened upon using a cutting balloon angioplasty.  After that he had resolution of his symptoms which were mostly with exertion and relieved by rest.  Since then he has had chest discomfort which is somewhat persistent.  It does not seem to be worse with exertion or relieved by rest, for example he was able to ride his bicycle to the office today without any issues, but says that he can have chest discomfort sitting in bed and reading a book.  He was seen in the office by Almyra Deforest, PA-C who placed him on long-acting Imdur.  This did not help his symptoms.  Subsequently he was seen in the ER who uptitrated his Imdur from 30 to 60 mg and increased his beta-blocker.  Although his blood pressure is better and heart rate is lower, his chest pain has not improved.  He describes it as a right-sided chest discomfort which is not worse with taking a deep breath or movement and again is more often at rest.  It does not radiate.  05/07/2020  [name] is seen today in follow-up.  He was treated earlier this year and had an angioplasty to the circumflex/OM 2 for in-stent restenosis which decreased the degree of stenosis from 95 to 30%.  Dual antiplatelet therapy with aspirin and Brilinta was recommended for least a year which will be reevaluated in April.  Ultimately he may remain on aspirin and Plavix.  He was also seen more recently in the emergency department for chest pain thought to be atypical.  I recommended Ranexa to the ER physician however he never started on the medicine.  Since then though he says his symptoms have improved.  He denies any recurrent chest pain.  He is made some dietary modifications.  He still active and rides his bicycle every day.   Unfortunately he has had some falls due to back problems and has a need for upcoming surgery to his right great toe.  Pressures well controlled today.  Recent lipids showed total cholesterol of 168, HDL 45, LDL 76 and triglycerides 237.  This was also associated with an A1c increase however he has been started on Metformin and is on October 4 A1c is 6.  03/11/2021  [name] returns today for follow-up.  He continues to do well.  He is now more than a year and a half out from angioplasty.  I did switch him from Brilinta over to Plavix.  Given the extent and recurrence of in-stent restenosis, I would favor keeping him on indefinite aspirin and Plavix.  The Plavix could be held if necessary for procedures.  Overall he denies any chest pain.  He says he is physically active.  His main issue struggling with some headaches.  He is scheduled to see a neurologist in October.  Blood pressure was good today.  His  cholesterol is trended up a little with total 152, HDL 44, triglycerides 99 and LDL 90, this was obtained just a few days ago.  He is on :Livalo 4 mg daily.  07/18/2021  [name] is seen today in follow-up.  Overall he is doing fairly well.  Unfortunately was not able to lower the cholesterol more.  His LDL remains at 91 based on recent labs.  Triglycerides have been elevated in the past however they have improved significantly are now normal at 45.  I do think he would benefit from additional lipid lowering and would recommend adding ezetimibe to his regimen.  Other than that he denies any anginal symptoms.  He says he might need another foot surgery with Dr. Paulla Dolly.  He would likely have to stop his aspirin and Plavix for this.   PMHx:  Past Medical History:  Diagnosis Date   Alcohol dependence ([place])    Allergies    Arthritis    Chest pain    Chronic lower back pain    Chronic pain of right wrist    Coronary artery disease    a. Multiple prior caths/PCI. Cath 2013 with possible spasm of RCA, 70%  ISR of mid LCx with subsequent DES to mLCx and prox LCX. b. H/o microvascular angina. c. Recurrent angina 08/2014 - s/p PTCA/DES to prox Cx, PTCA/CBA to OM1.  c. LHC 06/10/15 with patent stents and some ISR in LCX and OM-1 that was not flow limiting --> Rx    Dyslipidemia    a. Intolerant to many statins except tolerating Livalo.   GERD (gastroesophageal reflux disease)    H/O cardiac catheterization 10/25/2018   Heart attack [facility])    Hypertension    Myocardial infarction [facility]) ~ 2010   S/P angioplasty with stent, DES, to proximal and mid LCX 12/15/11 12/15/2011   S/P foot surgery, right 04/2021   Shoulder pain    Stroke ([place])    pt. reports had a stroke around time of MI 2010   Type II diabetes mellitus ([place])    Unstable angina [facility])     Past Surgical History:  Procedure Laterality Date   CARDIAC CATHETERIZATION  06/15/2002   LAD with prox 40% stenosis, norma L main, Cfx with 25% lesion, RCA with long mid 25% stenosis (Dr. Vita Barley)   CARDIAC CATHETERIZATION  04/01/2010   normal L main, LAD wit mild stenosis, L Cfx with 70% in-stent restenosis, RCA with 70% in-stent restenosis, LVEF >60% (Dr. K. Mali Naela Nodal) - cutting ballon arthrectomy to RCA & Cfx (Dr. Rockne Menghini)   CARDIAC CATHETERIZATION  08/25/2010   preserved global LV contractility; multivessel CAD, diffuse 90-95% in-stent restenosis in prox placed Cfx stent - cutting balloon arthrectomy in Cfx with multiple dilatations 90-95% to 0% stenosis (Dr. Corky Downs)   CARDIAC CATHETERIZATION  01/26/2011   PCI & stenting of aggresive in-stent restenosis within previously stented AV groove Cfx with 3.0x39m Taxus DES (previous stents were Promus) (Dr. JAdora Fridge   CARDIAC CATHETERIZATION  05/11/2011   preserved LV function, 40% mid LAD stenosis, 30-40% narrowing proximal to stented semgnet of prox Cfx, patent mid RCA stent with smooth 20% narrowing in distal RCA (Dr. TCorky Downs   CARDIAC CATHETERIZATION  12/15/2011   PCI & stenting of proximal &  mid Cfx with DES - 3.0x143min proximal, 3.0x155mn mid (Dr. J. Adora Fridge CARDIAC CATHETERIZATION N/A 06/10/2015   Procedure: Left Heart Cath and Coronary Angiography;  Surgeon: DanJolaine ArtistD;  Location:  [place] INVASIVE CV LAB;  Service: Cardiovascular;  Laterality: N/A;   CARDIAC CATHETERIZATION  91/63/8466   cardiometabolic testing  5/99/3570   good exercise effort, peak VO2 79% predicted with normal VO2 HR curves (mild deconditioning)   COLONOSCOPY  12/2012   diminutive hyperplastic sigmoid poyp so repeat routine 2024   CORONARY BALLOON ANGIOPLASTY N/A 10/25/2018   Procedure: CORONARY BALLOON ANGIOPLASTY;  Surgeon: Jettie Booze, MD;  Location: [place] CV LAB;  Service: Cardiovascular;  Laterality: N/A;   CORONARY BALLOON ANGIOPLASTY N/A 09/29/2019   Procedure: CORONARY BALLOON ANGIOPLASTY;  Surgeon: Nelva Bush, MD;  Location: [place] CV LAB;  Service: Cardiovascular;  Laterality: N/A;   EXCISIONAL HEMORRHOIDECTOMY  1984   INTRAVASCULAR ULTRASOUND/IVUS N/A 09/29/2019   Procedure: Intravascular Ultrasound/IVUS;  Surgeon: Nelva Bush, MD;  Location: [place] CV LAB;  Service: Cardiovascular;  Laterality: N/A;   LEFT HEART CATH AND CORONARY ANGIOGRAPHY N/A 10/25/2018   Procedure: LEFT HEART CATH AND CORONARY ANGIOGRAPHY;  Surgeon: Jettie Booze, MD;  Location: [place] CV LAB;  Service: Cardiovascular;  Laterality: N/A;   LEFT HEART CATH AND CORONARY ANGIOGRAPHY N/A 09/29/2019   Procedure: LEFT HEART CATH AND CORONARY ANGIOGRAPHY;  Surgeon: Nelva Bush, MD;  Location: [place] CV LAB;  Service: Cardiovascular;  Laterality: N/A;   LEFT HEART CATHETERIZATION WITH CORONARY ANGIOGRAM N/A 05/11/2011   Procedure: LEFT HEART CATHETERIZATION WITH CORONARY ANGIOGRAM;  Surgeon: Troy Sine, MD;  Location: [facility] CATH LAB;  Service: Cardiovascular;  Laterality: N/A;  Possible percutaneous coronary intervention, possible IVUS   LEFT HEART CATHETERIZATION WITH  CORONARY ANGIOGRAM N/A 12/15/2011   Procedure: LEFT HEART CATHETERIZATION WITH CORONARY ANGIOGRAM;  Surgeon: Lorretta Harp, MD;  Location: [facility] CATH LAB;  Service: Cardiovascular;  Laterality: N/A;   LEFT HEART CATHETERIZATION WITH CORONARY ANGIOGRAM N/A 09/05/2014   Procedure: LEFT HEART CATHETERIZATION WITH CORONARY ANGIOGRAM;  Surgeon: Burnell Blanks, MD;  Location: [facility] CATH LAB;  Service: Cardiovascular;  Laterality: N/A;   LIPOMA EXCISION     back of the head   NM MYOCAR PERF WALL MOTION  02/2012   lexiscan myoview; mild perfusion defect in mid inferolateral & basal inferolateral region (infarct/scar); EF 52%, abnormal but ow risk scan   PERCUTANEOUS CORONARY STENT INTERVENTION (PCI-S)  09/05/2014   Procedure: PERCUTANEOUS CORONARY STENT INTERVENTION (PCI-S);  Surgeon: Burnell Blanks, MD;  Location: [facility] CATH LAB;  Service: Cardiovascular;;   TONSILLECTOMY      FAMHx:  Family History  Problem Relation Age of Onset   Leukemia Mother    Prostate cancer Father    Cancer Brother    Coronary artery disease Paternal Grandmother    Cancer Paternal Grandfather    Migraines Neg Hx    Headache Neg Hx     SOCHx:   reports that he quit smoking about 2 years ago. His smoking use included cigarettes. He has a 10.00 pack-year smoking history. He has never used smokeless tobacco. He reports that he does not currently use alcohol. He reports that he does not currently use drugs after having used the following drugs: Cocaine.  ALLERGIES:  Allergies  Allergen Reactions   Bee Venom Anaphylaxis and Hives   Gadolinium Hives, Itching, Swelling and Other (See Comments)    Swelling of eyes after receiving MR contrast. 13 hr prep recommended. Pt developed large hive and swelling under right eye after contrast, given 64m iv benadryl, pt will need premed before gadolinium//lh   Shellfish Allergy Anaphylaxis and Hives   Statins Other (See  Comments)    Myalgias. Tolerating livalo. Pain     Testosterone Cypionate Other (See Comments)    Testerone Injection --Increased breast tissue     ROS: Pertinent items noted in HPI and remainder of comprehensive ROS otherwise negative.  HOME MEDS: Current Outpatient Medications  Medication Sig Dispense Refill   Accu-Chek Softclix Lancets lancets SMARTSIG:1 Topical 4 Times Daily     amLODipine (NORVASC) 10 MG tablet Take 1 tablet (10 mg total) by mouth daily. 30 tablet 0   ARIPiprazole (ABILIFY) 2 MG tablet Take 1 tablet (2 mg total) by mouth daily. 30 tablet 0   aspirin 81 MG EC tablet Take 1 tablet (81 mg total) by mouth daily. (May buy from over the counter): Swallow whole for heart health 30 tablet 0   blood glucose meter kit and supplies KIT Dispense based on patient and insurance preference. Use up to four times daily as directed. (FOR ICD-9 250.00, 250.01). 1 each 0   Blood Pressure Monitor KIT 1 kit by Does not apply route daily. 1 kit 0   buPROPion (WELLBUTRIN XL) 150 MG 24 hr tablet Take 1 tablet (150 mg total) by mouth daily. 30 tablet 0   clopidogrel (PLAVIX) 75 MG tablet Take 1 tablet (75 mg total) by mouth daily. 30 tablet 0   EPINEPHrine 0.3 mg/0.3 mL IJ SOAJ injection Inject 0.3 mg into the muscle as needed for anaphylaxis. 1 each 2   glucose blood (ACCU-CHEK GUIDE) test strip Use as instructed 300 each 3   hydrOXYzine (ATARAX) 25 MG tablet Take 1 tablet (25 mg total) by mouth 3 (three) times daily as needed for anxiety. 30 tablet 0   LIVALO 4 MG TABS Take 1 tablet (4 mg total) by mouth daily. (Patient taking differently: Take 4 mg by mouth daily.) 90 tablet 3   metFORMIN (GLUCOPHAGE) 500 MG tablet Take 1 tablet (500 mg total) by mouth 2 (two) times daily with a meal. 60 tablet 0   metoprolol tartrate (LOPRESSOR) 50 MG tablet Take 1 tablet (50 mg total) by mouth 2 (two) times daily. 60 tablet 0   nitroGLYCERIN (NITROSTAT) 0.4 MG SL tablet Place 1 tablet (0.4 mg total) under the tongue every 5 (five) minutes x 3 doses as needed  for chest pain. 25 tablet 3   Omega-3 Fatty Acids (FISH OIL PO) Take 1 capsule by mouth daily.     SF 5000 PLUS 1.1 % CREA dental cream Place 1 application onto teeth at bedtime as needed (For teeth sensitivity).     sildenafil (VIAGRA) 100 MG tablet Take 1 tablet (100 mg total) by mouth daily as needed. (Patient taking differently: Take 100 mg by mouth daily as needed for erectile dysfunction.) 10 tablet 5   Testosterone (ANDROGEL PUMP) 20.25 MG/ACT (1.62%) GEL Place 1 Pump onto the skin daily. 75 g 2   traZODone (DESYREL) 50 MG tablet Take 1 tablet (50 mg total) by mouth at bedtime. 30 tablet 0   No current facility-administered medications for this visit.    LABS/IMAGING: No results found for this or any previous visit (from the past 48 hour(s)). No results found.  VITALS: BP 122/88 (BP Location: Right Arm, Patient Position: Sitting, Cuff Size: Normal)    Pulse (!) 58    Ht 5' 8"  (1.727 m)    Wt 192 lb 6.4 oz (87.3 kg)    SpO2 98%    BMI 29.25 kg/m   EXAM: General appearance: alert and no distress Neck: no carotid bruit and  no JVD Lungs: clear to auscultation bilaterally Heart: regular rate and rhythm, S1, S2 normal, no murmur, click, rub or gallop Abdomen: soft, non-tender; bowel sounds normal; no masses,  no organomegaly Extremities: extremities normal, atraumatic, no cyanosis or edema Pulses: 2+ and symmetric Skin: Skin color, texture, turgor normal. No rashes or lesions Neurologic: Grossly normal Psych: Pleasant  EKG: Sinus bradycardia 58-personally reviewed  ASSESSMENT: Recurrent chest pain -PCI for in-stent restenosis to the circumflex/OM 2 (09/2019) Recent unstable angina status post PTCA to the circumflex and marginal branches for in-stent restenosis Coronary artery disease status post multiple PCI as with the last stent to the circumflex in 2013 Dyslipidemia-on Livalo (intolerant to atorvastatin, simvastatin and rosuvastatin) Hypertension Diabetes type 2 - with  peripheral neuropathy Erectile dysfunction  PLAN: 1.   [name] has done well without recurrent angina.  We will give him a repeat fill of his nitroglycerin as needed today.  I advise adding Zetia because his LDL remains above target.  He has been close to goal before but would probably benefit from additional medication.  He is scheduled to have upcoming foot surgery.  At this point since he is well past his last intervention in 2021, would be okay to stop Plavix 5 days prior to surgery and aspirin 7 days prior to surgery and restart both afterwards once okay from a bleeding standpoint.  Follow-up in 3-4 months or sooner as necessary  Pixie Casino, MD, [facility], [place] Director of the Advanced Lipid Disorders &  Cardiovascular Risk Reduction Clinic Diplomate of the American Board of Clinical Lipidology Attending Cardiologist  Direct Dial: [phone]   Fax: [phone]  Website:  [email or web address] 07/18/2021, 9:17 AM

## 2021-07-18 NOTE — Patient Instructions (Signed)
Medication Instructions:  START zetia 10mg  daily Continue other current medications  *If you need a refill on your cardiac medications before your next appointment, please call your pharmacy*   Lab Work: FASTING lab work to check cholesterol in 3-4 months   If you have labs (blood work) drawn today and your tests are completely normal, you will receive your results only by: [place] (if you have MyChart) OR A paper copy in the mail If you have any lab test that is abnormal or we need to change your treatment, we will call you to review the results.   Testing/Procedures: NONE   Follow-Up: At [facility], you and your health needs are our priority.  As part of our continuing mission to provide you with exceptional heart care, we have created designated Provider Care Teams.  These Care Teams include your primary Cardiologist (physician) and Advanced Practice Providers (APPs -  Physician Assistants and Nurse Practitioners) who all work together to provide you with the care you need, when you need it.  We recommend signing up for the patient portal called "MyChart".  Sign up information is provided on this After Visit Summary.  MyChart is used to connect with patients for Virtual Visits (Telemedicine).  Patients are able to view lab/test results, encounter notes, upcoming appointments, etc.  Non-urgent messages can be sent to your provider as well.   To learn more about what you can do with MyChart, go to [email or web address].    Your next appointment:   3-4 months with Dr. Debara Pickett

## 2021-07-21 ENCOUNTER — Telehealth (HOSPITAL_COMMUNITY): Payer: Self-pay | Admitting: Nurse Practitioner

## 2021-07-21 NOTE — BH Assessment (Signed)
Care Management - [name] Follow Up Discharges   Writer attempted to make contact with patient today and was unsuccessful.  Writer left a HIPPA compliant voice message.   Per chart review, patient will follow up with [facility] n the 2nd Floor on 09-12-2021 with Darletta Moll, NP

## 2021-07-23 ENCOUNTER — Ambulatory Visit: Payer: Medicaid Other | Admitting: Podiatry

## 2021-07-28 NOTE — Progress Notes (Signed)
FOLLOW UP Date of Service/Encounter:  07/30/21   Subjective:  [name] (DOB: September 13, 1960) is a 61 y.o. male who returns to the [facility] [place] on 07/30/2021 in re-evaluation of the following: allergic rhinitis, shellfish allergy and history of bee sting reaction History obtained from: chart review and patient.  For Review, LV was on 05/07/21  with Dr.Oziel Beitler.  At that visit he requested paperwork for his HOA asking for carpet removal in his apartment due to allergic rhinitis flare.  This paperwork was provided per his request.  Continued on his daily Zyrtec.  Discussed consideration of Flonase and allergy injections.  Pertinent history/diagnostics: - Shellfish allergy: History of facial swelling and rash with positive test - Bee sting allergy: Declined blood work, carries EpiPen -Seasonal and perennial allergic rhinitis: Skin testing 09/09/2020 positive to grass, weed pollen, dust mites, cat, cockroach, ragweed, tree; borderline to dog  Today he is here to request updated paperwork.  He is overall doing well since our last visit.  He is really hoping to get his carpet changed out, but needs paperwork filled out again.  There has been a problem with paperwork because it requests information about his disability (which we do not treat).  I have offered to write a letter explaining this to the [facility] to help with his current case, and this is acceptable to him.  I have also offered to speak with someone directly if needed, but can not attest to his disability.  He did change out his filters at home to HEPA and can notice a difference with this.  He does not currently need any refills.  Avoiding shellfish, no reported new bee stings.  Allergies as of 07/30/2021       Reactions   Bee Venom Anaphylaxis, Hives   Gadolinium Hives, Itching, Swelling, Other (See Comments)   Swelling of eyes after receiving MR contrast. 13 hr prep recommended. Pt developed large hive and swelling under right  eye after contrast, given 14m iv benadryl, pt will need premed before gadolinium//lh   Shellfish Allergy Anaphylaxis, Hives   Statins Other (See Comments)   Myalgias. Tolerating livalo. Pain   Testosterone Cypionate Other (See Comments)   Testerone Injection --Increased breast tissue         Medication List        Accurate as of July 30, 2021  1:14 PM. If you have any questions, ask your nurse or doctor.          Accu-Chek Guide test strip Generic drug: glucose blood Use as instructed   Accu-Chek Softclix Lancets lancets SMARTSIG:1 Topical 4 Times Daily   amLODipine 10 MG tablet Commonly known as: NORVASC Take 1 tablet (10 mg total) by mouth daily.   ARIPiprazole 2 MG tablet Commonly known as: ABILIFY Take 1 tablet (2 mg total) by mouth daily.   aspirin 81 MG EC tablet Take 1 tablet (81 mg total) by mouth daily. (May buy from over the counter): Swallow whole for heart health   blood glucose meter kit and supplies Kit Dispense based on patient and insurance preference. Use up to four times daily as directed. (FOR ICD-9 250.00, 250.01).   Blood Pressure Monitor Kit 1 kit by Does not apply route daily.   buPROPion 150 MG 24 hr tablet Commonly known as: WELLBUTRIN XL Take 1 tablet (150 mg total) by mouth daily.   clopidogrel 75 MG tablet Commonly known as: PLAVIX Take 1 tablet by mouth once daily   EPINEPHrine 0.3 mg/0.3  mL Soaj injection Commonly known as: EPI-PEN Inject 0.3 mg into the muscle as needed for anaphylaxis.   ezetimibe 10 MG tablet Commonly known as: ZETIA Take 1 tablet (10 mg total) by mouth daily.   FISH OIL PO Take 1 capsule by mouth daily.   hydrOXYzine 25 MG tablet Commonly known as: ATARAX Take 1 tablet (25 mg total) by mouth 3 (three) times daily as needed for anxiety.   Livalo 4 MG Tabs Generic drug: Pitavastatin Calcium Take 1 tablet (4 mg total) by mouth daily.   metFORMIN 500 MG tablet Commonly known as: GLUCOPHAGE Take  1 tablet (500 mg total) by mouth 2 (two) times daily with a meal.   metoprolol tartrate 50 MG tablet Commonly known as: LOPRESSOR Take 1 tablet (50 mg total) by mouth 2 (two) times daily. What changed: how much to take   nitroGLYCERIN 0.4 MG SL tablet Commonly known as: NITROSTAT Place 1 tablet (0.4 mg total) under the tongue every 5 (five) minutes x 3 doses as needed for chest pain.   SF 5000 Plus 1.1 % Crea dental cream Generic drug: sodium fluoride Place 1 application onto teeth at bedtime as needed (For teeth sensitivity).   sildenafil 100 MG tablet Commonly known as: VIAGRA Take 1 tablet (100 mg total) by mouth daily as needed. What changed: reasons to take this   Testosterone 20.25 MG/ACT (1.62%) Gel Commonly known as: AndroGel Pump Place 1 Pump onto the skin daily.   traZODone 50 MG tablet Commonly known as: DESYREL Take 1 tablet (50 mg total) by mouth at bedtime.       Past Medical History:  Diagnosis Date   Alcohol dependence ([name])    Allergies    Arthritis    Chest pain    Chronic lower back pain    Chronic pain of right wrist    Coronary artery disease    a. Multiple prior caths/PCI. Cath 2013 with possible spasm of RCA, 70% ISR of mid LCx with subsequent DES to mLCx and prox LCX. b. H/o microvascular angina. c. Recurrent angina 08/2014 - s/p PTCA/DES to prox Cx, PTCA/CBA to OM1.  c. LHC 06/10/15 with patent stents and some ISR in LCX and OM-1 that was not flow limiting --> Rx    Dyslipidemia    a. Intolerant to many statins except tolerating Livalo.   GERD (gastroesophageal reflux disease)    H/O cardiac catheterization 10/25/2018   Heart attack [facility])    Hypertension    Myocardial infarction [facility]) ~ 2010   S/P angioplasty with stent, DES, to proximal and mid LCX 12/15/11 12/15/2011   S/P foot surgery, right 04/2021   Shoulder pain    Stroke ([place])    pt. reports had a stroke around time of MI 2010   Type II diabetes mellitus ([place])    Unstable angina [facility])     Past Surgical History:  Procedure Laterality Date   CARDIAC CATHETERIZATION  06/15/2002   LAD with prox 40% stenosis, norma L main, Cfx with 25% lesion, RCA with long mid 25% stenosis (Dr. Vita Barley)   CARDIAC CATHETERIZATION  04/01/2010   normal L main, LAD wit mild stenosis, L Cfx with 70% in-stent restenosis, RCA with 70% in-stent restenosis, LVEF >60% (Dr. K. Mali Hilty) - cutting ballon arthrectomy to RCA & Cfx (Dr. Rockne Menghini)   CARDIAC CATHETERIZATION  08/25/2010   preserved global LV contractility; multivessel CAD, diffuse 90-95% in-stent restenosis in prox placed Cfx stent - cutting balloon arthrectomy in Cfx  with multiple dilatations 90-95% to 0% stenosis (Dr. Corky Downs)   CARDIAC CATHETERIZATION  01/26/2011   PCI & stenting of aggresive in-stent restenosis within previously stented AV groove Cfx with 3.0x73m Taxus DES (previous stents were Promus) (Dr. JAdora Fridge   CARDIAC CATHETERIZATION  05/11/2011   preserved LV function, 40% mid LAD stenosis, 30-40% narrowing proximal to stented semgnet of prox Cfx, patent mid RCA stent with smooth 20% narrowing in distal RCA (Dr. TCorky Downs   CARDIAC CATHETERIZATION  12/15/2011   PCI & stenting of proximal & mid Cfx with DES - 3.0x153min proximal, 3.0x1568mn mid (Dr. [place]. [place]ora [place]idge [place] 06/10/2015   Procedure: Left Heart Cath and Coronary Angiography;  Surgeon: DanJolaine ArtistD;  Location: [facility] [place];  Service: Cardiovascular;  Laterality: N/A;   CARDIAC CATHETERIZATION  04/63/89/3734cardiometabolic testing  08/10/85/6811good exercise effort, peak VO2 79% predicted with normal VO2 HR curves (mild deconditioning)   COLONOSCOPY  12/2012   diminutive hyperplastic sigmoid poyp so repeat routine 2024   CORONARY BALLOON ANGIOPLASTY N/A 10/25/2018   Procedure: CORONARY BALLOON ANGIOPLASTY;  Surgeon: VarJettie BoozeD;  Location: [facility] [place] [facility];  Service: Cardiovascular;  Laterality: N/A;   CORONARY BALLOON  ANGIOPLASTY N/A 09/29/2019   Procedure: CORONARY BALLOON ANGIOPLASTY;  Surgeon: EndNelva BushD;  Location: [facility] [place] [facility];  Service: Cardiovascular;  Laterality: N/A;   EXCISIONAL HEMORRHOIDECTOMY  1984   INTRAVASCULAR ULTRASOUND/IVUS N/A 09/29/2019   Procedure: Intravascular Ultrasound/IVUS;  Surgeon: EndNelva BushD;  Location: [facility] [place] [facility];  Service: Cardiovascular;  Laterality: N/A;   LEFT HEART CATH AND CORONARY ANGIOGRAPHY N/A 10/25/2018   Procedure: LEFT HEART CATH AND CORONARY ANGIOGRAPHY;  Surgeon: VarJettie BoozeD;  Location: [facility] [place] [facility];  Service: Cardiovascular;  Laterality: N/A;   LEFT HEART CATH AND CORONARY ANGIOGRAPHY N/A 09/29/2019   Procedure: LEFT HEART CATH AND CORONARY ANGIOGRAPHY;  Surgeon: EndNelva BushD;  Location: [facility];  Service: Cardiovascular;  Laterality: N/A;   LEFT HEART CATHETERIZATION WITH CORONARY ANGIOGRAM N/A 05/11/2011   Procedure: LEFT HEART CATHETERIZATION WITH CORONARY ANGIOGRAM;  Surgeon: ThoTroy SineD;  [facility];  Service: Cardiovascular;  Laterality: N/A;  Possible percutaneous coronary intervention, possible IVUS   LEFT HEART CATHETERIZATION WITH CORONARY ANGIOGRAM N/A 12/15/2011   Procedure: LEFT HEART CATHETERIZATION WITH CORONARY ANGIOGRAM;  Surgeon: JonLorretta HarpD;  [facility];  Service: Cardiovascular;  Laterality: N/A;   LEFT HEART CATHETERIZATION WITH CORONARY ANGIOGRAM N/A 09/05/2014   Procedure: LEFT HEART CATHETERIZATION WITH CORONARY ANGIOGRAM;  Surgeon: ChrBurnell BlanksD;  [facility];  Service: Cardiovascular;  Laterality: N/A;   LIPOMA EXCISION     back of the head   NM MYOCAR PERF WALL MOTION  02/2012   lexiscan myoview; mild perfusion defect in mid inferolateral & basal inferolateral region (infarct/scar); EF 52%, abnormal but ow risk scan   PERCUTANEOUS CORONARY STENT INTERVENTION (PCI-S)  09/05/2014   Procedure: PERCUTANEOUS CORONARY STENT  INTERVENTION (PCI-S);  Surgeon: ChrBurnell BlanksD;  [facility];  Service: Cardiovascular;;   TONSILLECTOMY     Otherwise, there have been no changes to his past medical history, surgical history, family history, or social history.  ROS: All others negative except as noted per HPI.   Objective:  BP 130/82    Pulse 71    Temp (!) 97.4 F (36.3 C) (Temporal)  Resp 16    Ht 5' 7.32" (1.71 m)    Wt 192 lb 12.8 oz (87.5 kg)    SpO2 97%    BMI 29.91 kg/m  Body mass index is 29.91 kg/m. Physical Exam: General Appearance:  Alert, cooperative, no distress, appears stated age  Head:  Normocephalic, without obvious abnormality, atraumatic  Eyes:  Conjunctiva clear, EOM's intact  Nose: Nares normal  Throat: Lips, tongue normal; teeth and gums normal  Neck: Supple, symmetrical  Lungs:    Respirations unlabored, no coughing  Heart:  ,Appears well perfused  Extremities: No edema  Skin: Skin color, texture, turgor normal, no rashes or lesions on visualized portions of skin  Neurologic: No gross deficits   Assessment/Plan  Environmental allergies-uncontrolled Continue environmental control measures (including consideration of carpet removal)-letter provided for consideration of removal May use over the counter antihistamines such as Zyrtec (cetirizine), Claritin (loratadine), Allegra (fexofenadine), or Xyzal (levocetirizine) daily as needed. May use Flonase (fluticasone) nasal spray 1 spray per nostril twice a day as needed for nasal congestion.  Nasal saline spray (i.e., Simply Saline) or nasal saline lavage (i.e., NeilMed) is recommended as needed and prior to medicated nasal sprays. Consider allergy injections   Food allergies-stable Continue strict avoidance of shellfish  For mild symptoms you can take over the counter antihistamines such as Benadryl and monitor symptoms closely. If symptoms worsen or if you have severe symptoms including breathing issues, throat closure,  significant swelling, whole body hives, severe diarrhea and vomiting, lightheadedness then inject epinephrine and seek immediate medical care afterwards. Epipen prescription updated Food action plan given.  Bee stings allergy-stable Avoid bee stings. Continue to carry epinephrine autoinjector in case of accidental sting and severe reaction.  Follow up in 12 months or sooner if needed.   Sigurd Sos, MD  [facility] [place] [facility] [place]

## 2021-07-29 ENCOUNTER — Other Ambulatory Visit: Payer: Self-pay | Admitting: Internal Medicine

## 2021-07-30 ENCOUNTER — Ambulatory Visit (INDEPENDENT_AMBULATORY_CARE_PROVIDER_SITE_OTHER): Payer: Medicaid Other | Admitting: Internal Medicine

## 2021-07-30 ENCOUNTER — Other Ambulatory Visit: Payer: Self-pay

## 2021-07-30 ENCOUNTER — Encounter: Payer: Self-pay | Admitting: Internal Medicine

## 2021-07-30 VITALS — BP 130/82 | HR 71 | Temp 97.4°F | Resp 16 | Ht 67.32 in | Wt 192.8 lb

## 2021-07-30 DIAGNOSIS — J3089 Other allergic rhinitis: Secondary | ICD-10-CM

## 2021-07-30 DIAGNOSIS — Z91030 Bee allergy status: Secondary | ICD-10-CM | POA: Diagnosis not present

## 2021-07-30 DIAGNOSIS — T781XXD Other adverse food reactions, not elsewhere classified, subsequent encounter: Secondary | ICD-10-CM

## 2021-07-31 ENCOUNTER — Ambulatory Visit (INDEPENDENT_AMBULATORY_CARE_PROVIDER_SITE_OTHER): Payer: Medicaid Other

## 2021-07-31 ENCOUNTER — Encounter: Payer: Self-pay | Admitting: Podiatry

## 2021-07-31 ENCOUNTER — Ambulatory Visit: Payer: Medicaid Other | Admitting: Podiatry

## 2021-07-31 DIAGNOSIS — M21619 Bunion of unspecified foot: Secondary | ICD-10-CM

## 2021-07-31 DIAGNOSIS — Z472 Encounter for removal of internal fixation device: Secondary | ICD-10-CM | POA: Diagnosis not present

## 2021-07-31 DIAGNOSIS — M216X1 Other acquired deformities of right foot: Secondary | ICD-10-CM | POA: Diagnosis not present

## 2021-08-01 NOTE — Progress Notes (Signed)
Subjective:   Patient ID: [name], male   DOB: 61 y.o.   MRN: [id number]   HPI Patient states has done very well with bunion surgery but still having discomfort on top of the foot and states that he feels like the pins are bothering him.  Patient results of his previous procedure   ROS      Objective:  Physical Exam  Neurovascular status intact excellent healing structural bunion deformity right but does have prominence of the pin x2 right with irritation when I pressed on both areas     Assessment:  Doing well post bunionectomy but does appear to have irritation with pins of the right forefoot internal fixation     Plan:  H&P discussed condition and I do think we can fix this but will require pin removal I Apsley explained there is no guarantees this will solve his problem is part of it may be just due to some arthritis of the joint or other low-grade pathology but this is the best chance we have of giving him complete relief.  Patient wants procedures at this point I went ahead and I allowed him to read and signed consent form understanding alternative treatments complications and patient scheduled for outpatient surgery.  Patient is encouraged to call questions concerns which may arise prior to procedure

## 2021-08-04 ENCOUNTER — Ambulatory Visit (INDEPENDENT_AMBULATORY_CARE_PROVIDER_SITE_OTHER): Payer: Medicaid Other | Admitting: Nurse Practitioner

## 2021-08-04 ENCOUNTER — Other Ambulatory Visit: Payer: Self-pay

## 2021-08-04 ENCOUNTER — Encounter: Payer: Self-pay | Admitting: Nurse Practitioner

## 2021-08-04 VITALS — BP 152/97 | HR 68 | Temp 98.0°F | Ht 68.0 in | Wt 192.0 lb

## 2021-08-04 DIAGNOSIS — E1159 Type 2 diabetes mellitus with other circulatory complications: Secondary | ICD-10-CM | POA: Diagnosis not present

## 2021-08-04 DIAGNOSIS — I10 Essential (primary) hypertension: Secondary | ICD-10-CM

## 2021-08-04 LAB — POCT GLYCOSYLATED HEMOGLOBIN (HGB A1C)
HbA1c POC (<> result, manual entry): 6.3 % (ref 4.0–5.6)
HbA1c, POC (controlled diabetic range): 6.3 % (ref 0.0–7.0)
HbA1c, POC (prediabetic range): 6.3 % (ref 5.7–6.4)
Hemoglobin A1C: 6.3 % — AB (ref 4.0–5.6)

## 2021-08-04 LAB — POCT URINALYSIS DIP (CLINITEK)
Bilirubin, UA: NEGATIVE
Blood, UA: NEGATIVE
Glucose, UA: NEGATIVE mg/dL
Ketones, POC UA: NEGATIVE mg/dL
Leukocytes, UA: NEGATIVE
Nitrite, UA: NEGATIVE
POC PROTEIN,UA: NEGATIVE
Spec Grav, UA: 1.01 (ref 1.010–1.025)
Urobilinogen, UA: 0.2 E.U./dL
pH, UA: 5.5 (ref 5.0–8.0)

## 2021-08-04 NOTE — Patient Instructions (Signed)

## 2021-08-04 NOTE — Progress Notes (Signed)
[name], [name]  [id number] Phone:  [phone]   Fax:  [phone]   Established Patient Office Visit  Subjective:  Patient ID: [name], male    DOB: 1960-08-07  Age: 61 y.o. MRN: [id number]  CC:  Chief Complaint  Patient presents with   Follow-up    Pt is here today for his 3 month follow up with no concerns or issues to discuss.    HPI [name] presents for follow up. She  has a past medical history of Alcohol dependence ([name]), Allergies, Arthritis, Chest pain, Chronic lower back pain, Chronic pain of right wrist, Coronary artery disease, Dyslipidemia, GERD (gastroesophageal reflux disease), H/O cardiac catheterization (10/25/2018), Heart attack ([name]), Hypertension, Myocardial infarction ([name]) (~ 2010), S/P angioplasty with stent, DES, to proximal and mid LCX 12/15/11 (12/15/2011), S/P foot surgery, right (04/2021), Shoulder pain, Stroke ([name]), Type II diabetes mellitus ([name]), and Unstable angina ([name]).   [name] is in today for diabetes follow up. The prescribed treatment is metformin 500 mg  along with therapy zetia and livalo. The reported use of treatment is consistent with prescribed. [name] denies reported side effects from the treatment. Home glucose monitoring indicates a CBG normal range. His goal is to consisently eating healthy. [name] continues to have the fatigue.  Denies fever, chills, headache, dizziness, visual changes, polydipsia,  polyphagia shortness of breath, dyspnea on exertion, chest pain, abdominal pain, nausea, vomiting, polyuria, constipation, diarrhea,  any edema, numbness, tingling, burning of hand or feet. There has been a professional eye exam in the year. Closely followed podiatry. [name] is going to have surgery on his foot to remove the pin. [name] has not been able to work out.     [name] reports that [name] has note taken his medication. [name] continues to take metoprolol 100 mg BID. [name] does not monitor BP or HR at home.  Heartrate 68 today.    Past Medical History:  Diagnosis Date   Alcohol dependence ([name])    Allergies    Arthritis    Chest pain    Chronic lower back pain    Chronic pain of right wrist    Coronary artery disease    a. Multiple prior caths/PCI. Cath 2013 with possible spasm of RCA, 70% ISR of mid LCx with subsequent DES to mLCx and prox LCX. b. H/o microvascular angina. c. Recurrent angina 08/2014 - s/p PTCA/DES to prox Cx, PTCA/CBA to OM1.  c. LHC 06/10/15 with patent stents and some ISR in LCX and OM-1 that was not flow limiting --> Rx    Dyslipidemia    a. Intolerant to many statins except tolerating Livalo.   GERD (gastroesophageal reflux disease)    H/O cardiac catheterization 10/25/2018   Heart attack ([name])    Hypertension    Myocardial infarction [name]) ~ 2010   S/P angioplasty with stent, DES, to proximal and mid LCX 12/15/11 12/15/2011   S/P foot surgery, right 04/2021   Shoulder pain    Stroke ([name])    pt. reports had a stroke around time of MI 2010   Type II diabetes mellitus ([place])    Unstable angina [name])     Past Surgical History:  Procedure Laterality Date   CARDIAC CATHETERIZATION  06/15/2002   LAD with prox 40% stenosis, norma L main, Cfx with 25% lesion, RCA with long mid 25% stenosis (Dr. Vita Barley)   CARDIAC CATHETERIZATION  04/01/2010   normal L main, LAD  wit mild stenosis, L Cfx with 70% in-stent restenosis, RCA with 70% in-stent restenosis, LVEF >60% (Dr. K. Mali Hilty) - cutting ballon arthrectomy to RCA & Cfx (Dr. Rockne Menghini)   CARDIAC CATHETERIZATION  08/25/2010   preserved global LV contractility; multivessel CAD, diffuse 90-95% in-stent restenosis in prox placed Cfx stent - cutting balloon arthrectomy in Cfx with multiple dilatations 90-95% to 0% stenosis (Dr. Corky Downs)   CARDIAC CATHETERIZATION  01/26/2011   PCI & stenting of aggresive in-stent restenosis within previously stented AV groove Cfx with 3.0x102m Taxus DES (previous stents were Promus) (Dr. JAdora Fridge   CARDIAC CATHETERIZATION  05/11/2011   preserved LV function, 40% mid LAD stenosis, 30-40% narrowing proximal to stented semgnet of prox Cfx, patent mid RCA stent with smooth 20% narrowing in distal RCA (Dr. TCorky Downs   CARDIAC CATHETERIZATION  12/15/2011   PCI & stenting of proximal & mid Cfx with DES - 3.0x110min proximal, 3.0x1534mn mid (Dr. J. Adora Fridge [place] 06/10/2015   Procedure: Left Heart Cath and Coronary Angiography;  Surgeon: DanJolaine ArtistD;  [facility] [place] [facility];  Service: Cardiovascular;  Laterality: N/A;   CARDIAC CATHETERIZATION  04/44/81/8563cardiometabolic testing  08/10/47/7026good exercise effort, peak VO2 79% predicted with normal VO2 HR curves (mild deconditioning)   COLONOSCOPY  12/2012   diminutive hyperplastic sigmoid poyp so repeat routine 2024   CORONARY BALLOON ANGIOPLASTY N/A 10/25/2018   Procedure: CORONARY BALLOON ANGIOPLASTY;  Surgeon: VarJettie BoozeD;  Location: [facility] [place] [facility];  Service: Cardiovascular;  Laterality: N/A;   CORONARY BALLOON ANGIOPLASTY N/A 09/29/2019   Procedure: CORONARY BALLOON ANGIOPLASTY;  Surgeon: EndNelva BushD;  Location: [facility] [place] [facility];  Service: Cardiovascular;  Laterality: N/A;   EXCISIONAL HEMORRHOIDECTOMY  1984   INTRAVASCULAR ULTRASOUND/IVUS N/A 09/29/2019   Procedure: Intravascular Ultrasound/IVUS;  Surgeon: EndNelva BushD;  Location: [facility] [place] [facility];  Service: Cardiovascular;  Laterality: N/A;   LEFT HEART CATH AND CORONARY ANGIOGRAPHY N/A 10/25/2018   Procedure: LEFT HEART CATH AND CORONARY ANGIOGRAPHY;  Surgeon: VarJettie BoozeD;  Location: [facility] [place] [facility];  Service: Cardiovascular;  Laterality: N/A;   LEFT HEART CATH AND CORONARY ANGIOGRAPHY N/A 09/29/2019   Procedure: LEFT HEART CATH AND CORONARY ANGIOGRAPHY;  Surgeon: EndNelva BushD;  Location: [facility] [place] [facility];  Service: Cardiovascular;  Laterality: N/A;   LEFT HEART CATHETERIZATION WITH  CORONARY ANGIOGRAM N/A 05/11/2011   Procedure: LEFT HEART CATHETERIZATION WITH CORONARY ANGIOGRAM;  Surgeon: ThoTroy SineD;  [facility];  Service: Cardiovascular;  Laterality: N/A;  Possible percutaneous coronary intervention, possible IVUS   LEFT HEART CATHETERIZATION WITH CORONARY ANGIOGRAM N/A 12/15/2011   Procedure: LEFT HEART CATHETERIZATION WITH CORONARY ANGIOGRAM;  Surgeon: JonLorretta HarpD;  [facility];  Service: Cardiovascular;  Laterality: N/A;   LEFT HEART CATHETERIZATION WITH CORONARY ANGIOGRAM N/A 09/05/2014   Procedure: LEFT HEART CATHETERIZATION WITH CORONARY ANGIOGRAM;  Surgeon: ChrBurnell BlanksD;  [facility];  Service: Cardiovascular;  Laterality: N/A;   LIPOMA EXCISION     back of the head   NM MYOCAR PERF WALL MOTION  02/2012   lexiscan myoview; mild perfusion defect in mid inferolateral & basal inferolateral region (infarct/scar); EF 52%, abnormal but ow risk scan   PERCUTANEOUS CORONARY STENT INTERVENTION (PCI-S)  09/05/2014   Procedure: PERCUTANEOUS CORONARY STENT INTERVENTION (PCI-S);  Surgeon: ChrBurnell BlanksD;  [facility];  Service: Cardiovascular;;   TONSILLECTOMY      Family History  Problem Relation Age of Onset   Leukemia Mother    Prostate cancer Father    Cancer Brother    Coronary artery disease Paternal Grandmother    Cancer Paternal Grandfather    Migraines Neg Hx    Headache Neg Hx     Social History   Socioeconomic History   Marital status: Widowed    Spouse name: Not on file   Number of children: 2   Years of education: GED   Highest education level: Not on file  Occupational History   Not on file  Tobacco Use   Smoking status: Former    Packs/day: 1.00    Years: 10.00    Pack years: 10.00    Types: Cigarettes    Quit date: 10/07/2018    Years since quitting: 2.8   Smokeless tobacco: Never  Vaping Use   Vaping Use: Never used  Substance and Sexual Activity   Alcohol  use: Not Currently    Comment: 2 bottles of wine on 12/08/2019 (one occurrence)   Drug use: Not Currently    Types: Cocaine    Comment: last use several months ago   Sexual activity: Not Currently  Other Topics Concern   Not on file  Social History Narrative   ** Merged History Encounter **       Social Determinants of Health   Financial Resource Strain: Not on file  Food Insecurity: Not on file  Transportation Needs: Not on file  Physical Activity: Not on file  Stress: Not on file  Social Connections: Not on file  Intimate Partner Violence: Not on file    Outpatient Medications Prior to Visit  Medication Sig Dispense Refill   Accu-Chek Softclix Lancets lancets SMARTSIG:1 Topical 4 Times Daily     amLODipine (NORVASC) 10 MG tablet Take 1 tablet (10 mg total) by mouth daily. 30 tablet 0   ARIPiprazole (ABILIFY) 2 MG tablet Take 1 tablet (2 mg total) by mouth daily. 30 tablet 0   aspirin 81 MG EC tablet Take 1 tablet (81 mg total) by mouth daily. (May buy from over the counter): Swallow whole for heart health 30 tablet 0   blood glucose meter kit and supplies KIT Dispense based on patient and insurance preference. Use up to four times daily as directed. (FOR ICD-9 250.00, 250.01). 1 each 0   Blood Pressure Monitor KIT 1 kit by Does not apply route daily. 1 kit 0   buPROPion (WELLBUTRIN XL) 150 MG 24 hr tablet Take 1 tablet (150 mg total) by mouth daily. 30 tablet 0   clopidogrel (PLAVIX) 75 MG tablet Take 1 tablet by mouth once daily 90 tablet 1   EPINEPHrine 0.3 mg/0.3 mL IJ SOAJ injection Inject 0.3 mg into the muscle as needed for anaphylaxis. 1 each 2   ezetimibe (ZETIA) 10 MG tablet Take 1 tablet (10 mg total) by mouth daily. 90 tablet 3   glucose blood (ACCU-CHEK GUIDE) test strip Use as instructed 300 each 3   hydrOXYzine (ATARAX) 25 MG tablet Take 1 tablet (25 mg total) by mouth 3 (three) times daily as needed for anxiety. 30 tablet 0   LIVALO 4 MG TABS Take 1 tablet (4 mg  total) by mouth daily. (Patient taking differently: Take 4 mg by mouth daily.) 90 tablet 3   metFORMIN (GLUCOPHAGE) 500 MG tablet Take 1 tablet (500 mg total) by mouth 2 (two) times daily with  a meal. 60 tablet 0   metoprolol tartrate (LOPRESSOR) 50 MG tablet Take 1 tablet (50 mg total) by mouth 2 (two) times daily. (Patient taking differently: Take 100 mg by mouth 2 (two) times daily.) 60 tablet 0   nitroGLYCERIN (NITROSTAT) 0.4 MG SL tablet Place 1 tablet (0.4 mg total) under the tongue every 5 (five) minutes x 3 doses as needed for chest pain. 25 tablet 3   Omega-3 Fatty Acids (FISH OIL PO) Take 1 capsule by mouth daily.     SF 5000 PLUS 1.1 % CREA dental cream Place 1 application onto teeth at bedtime as needed (For teeth sensitivity).     sildenafil (VIAGRA) 100 MG tablet Take 1 tablet (100 mg total) by mouth daily as needed. (Patient taking differently: Take 100 mg by mouth daily as needed for erectile dysfunction.) 10 tablet 5   Testosterone (ANDROGEL PUMP) 20.25 MG/ACT (1.62%) GEL Place 1 Pump onto the skin daily. 75 g 2   traZODone (DESYREL) 50 MG tablet Take 1 tablet (50 mg total) by mouth at bedtime. 30 tablet 0   No facility-administered medications prior to visit.    Allergies  Allergen Reactions   Bee Venom Anaphylaxis and Hives   Gadolinium Hives, Itching, Swelling and Other (See Comments)    Swelling of eyes after receiving MR contrast. 13 hr prep recommended. Pt developed large hive and swelling under right eye after contrast, given 55m iv benadryl, pt will need premed before gadolinium//lh   Shellfish Allergy Anaphylaxis and Hives   Statins Other (See Comments)    Myalgias. Tolerating livalo. Pain    Testosterone Cypionate Other (See Comments)    Testerone Injection --Increased breast tissue     ROS Review of Systems    Objective:    Physical Exam HENT:     Head: Normocephalic and atraumatic.  Cardiovascular:     Rate and Rhythm: Normal rate and regular rhythm.      Pulses: Normal pulses.     Heart sounds: Normal heart sounds.  Musculoskeletal:        General: Normal range of motion.     Cervical back: Normal range of motion.     Right lower leg: No edema.     Left lower leg: No edema.  Skin:    General: Skin is warm and dry.  Neurological:     Mental Status: [name] is alert.    BP (!) 152/97 (BP Location: Left Arm, Cuff Size: Normal)    Pulse 68    Temp 98 F (36.7 C)    Ht 5' 8"  (1.727 m)    Wt 192 lb (87.1 kg)    SpO2 97%    BMI 29.19 kg/m  Wt Readings from Last 3 Encounters:  08/04/21 192 lb (87.1 kg)  07/30/21 192 lb 12.8 oz (87.5 kg)  07/18/21 192 lb 6.4 oz (87.3 kg)     Health Maintenance Due  Topic Date Due   URINE MICROALBUMIN  09/30/2021    There are no preventive care reminders to display for this patient.  Lab Results  Component Value Date   TSH 3.372 07/07/2021   Lab Results  Component Value Date   WBC 4.8 07/07/2021   HGB 13.9 07/07/2021   HCT 41.7 07/07/2021   MCV 83.4 07/07/2021   PLT 229 07/07/2021   Lab Results  Component Value Date   NA 136 07/07/2021   K 4.3 07/07/2021   CO2 24 07/07/2021   GLUCOSE 144 (H) 07/07/2021   BUN  15 07/07/2021   CREATININE 0.92 07/07/2021   BILITOT 0.7 07/07/2021   ALKPHOS 51 07/07/2021   AST 24 07/07/2021   ALT 41 07/07/2021   PROT 6.6 07/07/2021   ALBUMIN 4.3 07/07/2021   CALCIUM 9.2 07/07/2021   ANIONGAP 8 07/07/2021   EGFR 96 03/05/2021   Lab Results  Component Value Date   CHOL 144 07/07/2021   Lab Results  Component Value Date   HDL 44 07/07/2021   Lab Results  Component Value Date   LDLCALC 91 07/07/2021   Lab Results  Component Value Date   TRIG 45 07/07/2021   Lab Results  Component Value Date   CHOLHDL 3.3 07/07/2021   Lab Results  Component Value Date   HGBA1C 6.3 (A) 08/04/2021   HGBA1C 6.3 08/04/2021   HGBA1C 6.3 08/04/2021   HGBA1C 6.3 08/04/2021      Assessment & Plan:   Problem List Items Addressed This Visit        Cardiovascular and Mediastinum   Essential hypertension (Chronic) Persistent  Followed by cardiology   Relevant Orders   POCT URINALYSIS DIP (CLINITEK) (Completed)     Endocrine   DM2 (diabetes mellitus, type 2) ([place]) - Primary (Chronic) Controlled Encourage compliance with current treatment regimen   Encourage regular CBG monitoring Encourage contacting office if excessive hyperglycemia and or hypoglycemia Continue lifestyle modification with healthy diet (fewer calories, more high fiber foods, whole grains and non-starchy vegetables, lower fat meat and fish, low-fat diary include healthy oils) regular exercise (physical activity) and weight loss Home BP monitoring also encouraged goal <130/80    Relevant Orders   HgB A1c (Completed)   POCT URINALYSIS DIP (CLINITEK) (Completed)    No orders of the defined types were placed in this encounter.   Follow-up: Return in about 4 months (around 12/02/2021) for follow up DM [id number].    Vevelyn Francois, NP

## 2021-08-05 LAB — MICROALBUMIN / CREATININE URINE RATIO
Creatinine, Urine: 18 mg/dL
Microalb/Creat Ratio: 326 mg/g creat — ABNORMAL HIGH (ref 0–29)
Microalbumin, Urine: 58.6 ug/mL

## 2021-08-11 MED ORDER — HYDROCODONE-ACETAMINOPHEN 10-325 MG PO TABS
1.0000 | ORAL_TABLET | Freq: Three times a day (TID) | ORAL | 0 refills | Status: AC | PRN
Start: 2021-08-11 — End: 2021-08-16

## 2021-08-11 NOTE — Addendum Note (Signed)
Addended by: Wallene Huh on: 08/11/2021 01:01 PM   Modules accepted: Orders

## 2021-08-12 ENCOUNTER — Encounter: Payer: Self-pay | Admitting: Podiatry

## 2021-08-12 DIAGNOSIS — M216X1 Other acquired deformities of right foot: Secondary | ICD-10-CM | POA: Diagnosis not present

## 2021-08-12 DIAGNOSIS — Z4889 Encounter for other specified surgical aftercare: Secondary | ICD-10-CM | POA: Diagnosis not present

## 2021-08-18 ENCOUNTER — Ambulatory Visit (INDEPENDENT_AMBULATORY_CARE_PROVIDER_SITE_OTHER): Payer: Medicaid Other | Admitting: Podiatry

## 2021-08-18 ENCOUNTER — Ambulatory Visit (INDEPENDENT_AMBULATORY_CARE_PROVIDER_SITE_OTHER): Payer: Medicaid Other

## 2021-08-18 ENCOUNTER — Other Ambulatory Visit: Payer: Self-pay

## 2021-08-18 ENCOUNTER — Encounter: Payer: Self-pay | Admitting: Podiatry

## 2021-08-18 DIAGNOSIS — Z9889 Other specified postprocedural states: Secondary | ICD-10-CM

## 2021-08-18 DIAGNOSIS — M216X1 Other acquired deformities of right foot: Secondary | ICD-10-CM | POA: Diagnosis not present

## 2021-08-18 NOTE — Progress Notes (Signed)
Subjective:   Patient ID: [name], male   DOB: 61 y.o.   MRN: [id number]   HPI Patient presents stating doing extremely well with surgery with minimal discomfort   ROS      Objective:  Physical Exam  Neurovascular status intact negative Bevelyn Buckles' sign noted wound edges well coapted stitches in place incision sites well-healed     Assessment:  Doing well post removal of 2 pins from the right first metatarsal     Plan:  X-ray reviewed stitches removed wound edges remain coapted completely applied sterile dressings and leave these on for the next week and reappoint.  Patient should have uneventful recovery and this should handle any discomfort he has had preoperatively  X-rays indicate satisfactory removal of pins good alignment joint looks congruent this

## 2021-08-22 ENCOUNTER — Other Ambulatory Visit: Payer: Self-pay | Admitting: Internal Medicine

## 2021-09-12 ENCOUNTER — Telehealth (HOSPITAL_COMMUNITY): Payer: Medicaid Other | Admitting: Psychiatry

## 2021-10-10 ENCOUNTER — Encounter (HOSPITAL_COMMUNITY): Payer: Self-pay

## 2021-10-10 ENCOUNTER — Ambulatory Visit (HOSPITAL_COMMUNITY)
Admission: RE | Admit: 2021-10-10 | Discharge: 2021-10-10 | Disposition: A | Discharge status: Home / Self Care | Payer: Medicaid Other | Source: Ambulatory Visit | Attending: Nurse Practitioner | Admitting: Nurse Practitioner

## 2021-10-10 VITALS — BP 133/99 | HR 79 | Temp 97.6°F | Resp 14

## 2021-10-10 DIAGNOSIS — S60459A Superficial foreign body of unspecified finger, initial encounter: Secondary | ICD-10-CM | POA: Diagnosis not present

## 2021-10-10 MED ORDER — MUPIROCIN 2 % EX OINT: 1.0000 "application " | TOPICAL_OINTMENT | Freq: Two times a day (BID) | CUTANEOUS | 0 refills | Status: DC

## 2021-10-10 NOTE — ED Provider Notes (Addendum)
?[place] ? ? ? ?CSN: [id number] ?Arrival date & time: 10/10/21  0805 ? ? ?  ? ?History   ?Chief Complaint ?Chief Complaint  ?Patient presents with  ? Wound Check  ?  Splinter in finger - Entered by patient  ? ? ?HPI ?[name] is a 61 y.o. male.  ? ?The patient is a 61 year old male who presents with a foreign object in the left middle finger.  He is thinks that the object is a splinter that he noticed after he was cleaning.  Symptoms have been present for the past week.  He notes that he has swelling over the knuckle of the left middle finger.  He states that when he rubs his finger across that he feels something sharp.  He does complain of pain and tenderness to the finger.  He denies fever, chills, abdominal pain, or malaise.  States he has not tried to remove the object. ? ?The history is provided by the patient.  ? ?Past Medical History:  ?Diagnosis Date  ? Alcohol dependence ([place])   ? Allergies   ? Arthritis   ? Chest pain   ? Chronic lower back pain   ? Chronic pain of right wrist   ? Coronary artery disease   ? a. Multiple prior caths/PCI. Cath 2013 with possible spasm of RCA, 70% ISR of mid LCx with subsequent DES to mLCx and prox LCX. b. H/o microvascular angina. c. Recurrent angina 08/2014 - s/p PTCA/DES to prox Cx, PTCA/CBA to OM1.  c. LHC 06/10/15 with patent stents and some ISR in LCX and OM-1 that was not flow limiting --> Rx   ? Dyslipidemia   ? a. Intolerant to many statins except tolerating Livalo.  ? GERD (gastroesophageal reflux disease)   ? H/O cardiac catheterization 10/25/2018  ? Heart attack ([place])   ? Hypertension   ? Myocardial infarction [facility]) ~ 2010  ? S/P angioplasty with stent, DES, to proximal and mid LCX 12/15/11 12/15/2011  ? S/P foot surgery, right 04/2021  ? Shoulder pain   ? Stroke [facility])   ? pt. reports had a stroke around time of MI 2010  ? Type II diabetes mellitus ([place])   ? Unstable angina ([name])   ? ? ?Patient Active Problem List  ? Diagnosis Date Noted  ?  Suicidal ideation 07/07/2021  ? Generalized anxiety disorder 07/07/2021  ? Other allergic rhinitis 09/09/2020  ? Adverse reaction to food, subsequent encounter 09/09/2020  ? History of bee sting allergy 09/09/2020  ? Alcohol dependence with alcohol-induced mood disorder ([place]) 03/19/2020  ? MDD (major depressive disorder), recurrent episode, severe ([place]) 03/18/2020  ? History of substance abuse ([place]) 01/04/2020  ? Severe 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 features ([place]) 12/10/2019  ? Tinea cruris 07/06/2019  ? Hemoglobin A1c less than 7.0% 07/06/2019  ? Muscle strain of wrist 04/05/2019  ? Pain in both wrists 04/05/2019  ? Anxiety 04/05/2019  ? Prediabetes 04/05/2019  ? MDD (major depressive disorder) 01/12/2019  ? Chest pain 10/25/2018  ? MDD (major depressive disorder), recurrent severe, without psychosis ([place]) 08/15/2018  ? Influenza A 07/17/2017  ? Lactic acidosis   ? Cough   ? Nausea and vomiting   ? Severe episode of recurrent major depressive disorder, without psychotic features ([place])   ? MDD (major depressive disorder), single episode, severe with psychotic features ([place]) 06/27/2017  ? MDD (major depressive disorder), severe ([place]) 06/23/2017  ? Essential hypertension 01/13/2016  ? Unstable angina ([name])   ?  ED (erectile dysfunction) 02/15/2014  ? Atypical chest pain 12/14/2011  ? Allergy to contrast media (used for diagnostic x-rays) 05/12/2011  ? Dyslipidemia, goal LDL below 70 05/09/2011  ? DM2 (diabetes mellitus, type 2) ([place]) 07/10/2010  ? CAD S/P percutaneous coronary angioplasty 07/10/2010  ? ? ?Past Surgical History:  ?Procedure Laterality Date  ? CARDIAC CATHETERIZATION  06/15/2002  ? LAD with prox 40% stenosis, norma L main, Cfx with 25% lesion, RCA with long mid 25% stenosis (Dr. Vita Barley)  ? CARDIAC CATHETERIZATION  04/01/2010  ? normal L main, LAD wit mild stenosis, L Cfx with 70% in-stent restenosis, RCA with 70% in-stent restenosis, LVEF >60% (Dr. K. Mali Hilty) - cutting ballon  arthrectomy to RCA & Cfx (Dr. Rockne Menghini)  ? CARDIAC CATHETERIZATION  08/25/2010  ? preserved global LV contractility; multivessel CAD, diffuse 90-95% in-stent restenosis in prox placed Cfx stent - cutting balloon arthrectomy in Cfx with multiple dilatations 90-95% to 0% stenosis (Dr. Corky Downs)  ? CARDIAC CATHETERIZATION  01/26/2011  ? PCI & stenting of aggresive in-stent restenosis within previously stented AV groove Cfx with 3.0x51m Taxus DES (previous stents were Promus) (Dr. JAdora Fridge  ? CARDIAC CATHETERIZATION  05/11/2011  ? preserved LV function, 40% mid LAD stenosis, 30-40% narrowing proximal to stented semgnet of prox Cfx, patent mid RCA stent with smooth 20% narrowing in distal RCA (Dr. TCorky Downs  ? CARDIAC CATHETERIZATION  12/15/2011  ? PCI & stenting of proximal & mid Cfx with DES - 3.0x119min proximal, 3.0x1563mn mid (Dr. J. Adora Fridge? CARDIAC CATHETERIZATION N/A 06/10/2015  ? Procedure: Left Heart Cath and Coronary Angiography;  Surgeon: DanJolaine ArtistD;  [facility] [place] [facility];  Service: Cardiovascular;  Laterality: N/A;  ? CARDIAC CATHETERIZATION  04/83/66/2947 cardiometabolic testing  08/14/52/6503 good exercise effort, peak VO2 79% predicted with normal VO2 HR curves (mild deconditioning)  ? COLONOSCOPY  12/2012  ? diminutive hyperplastic sigmoid poyp so repeat routine 2024  ? CORONARY BALLOON ANGIOPLASTY N/A 10/25/2018  ? Procedure: CORONARY BALLOON ANGIOPLASTY;  Surgeon: VarJettie BoozeD;  [facility] [place] [facility];  Service: Cardiovascular;  Laterality: N/A;  ? CORONARY BALLOON ANGIOPLASTY N/A 09/29/2019  ? Procedure: CORONARY BALLOON ANGIOPLASTY;  Surgeon: EndNelva BushD;  [facility] [place] [facility];  Service: Cardiovascular;  Laterality: N/A;  ? EXCISIONAL HEMORRHOIDECTOMY  1984  ? INTRAVASCULAR ULTRASOUND/IVUS N/A 09/29/2019  ? Procedure: Intravascular Ultrasound/IVUS;  Surgeon: EndNelva BushD;  Location: [facility] [place] [facility];  Service: Cardiovascular;  Laterality:  N/A;  ? LEFT HEART CATH AND CORONARY ANGIOGRAPHY N/A 10/25/2018  ? Procedure: LEFT HEART CATH AND CORONARY ANGIOGRAPHY;  Surgeon: VarJettie BoozeD;  Location[facility] [place] [facility];  Service: Cardiovascular;  Laterality: N/A;  ? LEFT HEART CATH AND CORONARY ANGIOGRAPHY N/A 09/29/2019  ? Procedure: LEFT HEART CATH AND CORONARY ANGIOGRAPHY;  Surgeon: EndNelva BushD;  Location: [facility] [place] [facility];  Service: Cardiovascular;  Laterality: N/A;  ? LEFT HEART CATHETERIZATION WITH CORONARY ANGIOGRAM N/A 05/11/2011  ? Procedure: LEFT HEART CATHETERIZATION WITH CORONARY ANGIOGRAM;  Surgeon: ThoTroy SineD;  [facility];  Service: Cardiovascular;  Laterality: N/A;  Possible percutaneous coronary intervention, possible IVUS  ? LEFT HEART CATHETERIZATION WITH CORONARY ANGIOGRAM N/A 12/15/2011  ? Procedure: LEFT HEART CATHETERIZATION WITH CORONARY ANGIOGRAM;  Surgeon: JonLorretta HarpD;  [facility];  Service: Cardiovascular;  Laterality: N/A;  ? LEFT HEART CATHETERIZATION WITH CORONARY ANGIOGRAM N/A 09/05/2014  ?  Procedure: LEFT HEART CATHETERIZATION WITH CORONARY ANGIOGRAM;  Surgeon: Burnell Blanks, MD;  Location: [facility] C[facility] LAB;  Service: Cardiovascular;  Laterality: N/A;  ? LIPOMA EXCISION    ? back of the head  ? NM MYOCAR PERF WALL MOTION  02/2012  ? lexiscan myoview; mild perfusion defect in mid inferolateral & basal inferolateral region (infarct/scar); EF 52%, abnormal but ow risk scan  ? PERCUTANEOUS CORONARY STENT INTERVENTION (PCI-S)  09/05/2014  ? Procedure: PERCUTANEOUS CORONARY STENT INTERVENTION (PCI-S);  Surgeon: Burnell Blanks, MD;  Location: [facility] C[facility] LAB;  Service: Cardiovascular;;  ? TONSILLECTOMY    ? ? ? ? ? ?Home Medications   ? ?Prior to Admission medications   ?Medication Sig Start Date End Date Taking? Authorizing Provider  ?mupirocin ointment (BACTROBAN) 2 % Apply 1 application. topically 2 (two) times daily. 10/10/21  Yes Altair Stanko-Warren, Alda Lea, NP  ?Accu-Chek  Softclix Lancets lancets SMARTSIG:1 Topical 4 Times Daily 12/14/20   [provider]  ?amLODipine (NORVASC) 10 MG tablet Take 1 tablet by mouth once daily 08/22/21   Hilty, Nadean Corwin, MD  ?ARIPiprazole (ABIL

## 2021-10-10 NOTE — Discharge Instructions (Addendum)
Apply medicine to the affected area twice daily as prescribed. ?Keep the area clean and dry.  Keep the area covered with a Band-Aid with any strenuous activity.  You should clean the area twice daily with warm water. ?Monitor the area for signs of infection to include fever, chills, abdominal pain, nausea, swelling to the finger, redness to the finger, foul-smelling drainage, or other concerns. ?Follow-up as needed. ?

## 2021-10-10 NOTE — ED Triage Notes (Signed)
Pt presents with splinter in his left middle finger from last week. Pt states he believes area is infected. Splinter has not been removed. ?

## 2021-11-17 ENCOUNTER — Telehealth: Payer: Self-pay

## 2021-11-17 NOTE — Telephone Encounter (Signed)
**Note De-Identified  Obfuscation** I called [place] [employer] at [phone] and did a Livalo PA with Mia over the phone. Per Mia this PA has been approved until 11/12/22. PA #: [id number]  I have notified [place] ([place]), [place] - [address] (Ph: [phone]) of this approval.

## 2021-11-18 ENCOUNTER — Telehealth (HOSPITAL_COMMUNITY): Payer: Medicaid Other | Admitting: Psychiatry

## 2021-11-19 ENCOUNTER — Telehealth (INDEPENDENT_AMBULATORY_CARE_PROVIDER_SITE_OTHER): Payer: Medicaid Other | Admitting: Psychiatry

## 2021-11-19 ENCOUNTER — Other Ambulatory Visit (HOSPITAL_COMMUNITY): Payer: Self-pay | Admitting: Psychiatry

## 2021-11-19 ENCOUNTER — Encounter (HOSPITAL_COMMUNITY): Payer: Self-pay | Admitting: Psychiatry

## 2021-11-19 DIAGNOSIS — F411 Generalized anxiety disorder: Secondary | ICD-10-CM | POA: Diagnosis not present

## 2021-11-19 MED ORDER — HYDROXYZINE HCL 25 MG PO TABS: 25.0000 mg | ORAL_TABLET | Freq: Three times a day (TID) | ORAL | 3 refills | Status: DC | PRN

## 2021-11-19 NOTE — Progress Notes (Signed)
[place] MD/PA/NP OP Progress Note Virtual Visit via Video Note  I connected with [name] on 11/19/21 at  8:30 AM EDT by a video enabled telemedicine application and verified that I am speaking with the correct person using two identifiers.  Location: Patient: Home Provider: Clinic   I discussed the limitations of evaluation and management by telemedicine and the availability of in person appointments. The patient expressed understanding and agreed to proceed.  I provided 30 minutes of non-face-to-face time during this encounter.    11/19/2021 8:51 AM [name]  MRN:  [id number]  Chief Complaint: "The depression has gone away, but I think I have some issues with anxiety"    HPI: 61 year old male seen today for follow up psychiatric evaluation.  He has a psychiatric history of depression, anxiety, alcohol dependence, and alcohol induced mood disorder.  He is currently managed on Abilify 5 mg daily, trazodone 50 mg nightly as needed, and hydroxyzine 25 mg 3 times daily as needed and Wellbutrin XL 150 mg daily.  He notes that he discontinued these medications a few months ago and now reports feeling anxious.  Today is well-groomed, pleasant, cooperative, and engaged in conversation.  He informed Probation officer that his depression has subsided however notes that at times she is still anxious.  Provider conducted a GAD-7 and patient scored a 13, at his last visit he scored a 42.  Provider also conducted PHQ-9 and patient scored a 2, at his last visit he scored a 22.  Today he denies SI/HI/VAH, mania, or paranoia.  He endorses adequate sleep and appetite.  Patient notes that he has been more active and notes that he goes to the gym more.   Patient informed Probation officer that he is interested in [facility] treatment.  Provider gave patient information to the [employer].    Patient informed writer that he continues to have chronic neck and back pain.  He reports that exercise and stretching are effective  in managing his pain.  Today patient agreeable to restarting hydroxyzine 25 mg 3 times daily as needed to help manage anxiety.  At this time he does not want to restart other medications.  No other concerns noted at this time.  Visit Diagnosis:    ICD-10-CM   1. Generalized anxiety disorder  F41.1 hydrOXYzine (ATARAX) 25 MG tablet      Past Psychiatric History:depression, anxiety, alcohol dependence, and alcohol induced mood disorder.   Past Medical History:  Past Medical History:  Diagnosis Date   Alcohol dependence ([place])    Allergies    Arthritis    Chest pain    Chronic lower back pain    Chronic pain of right wrist    Coronary artery disease    a. Multiple prior caths/PCI. Cath 2013 with possible spasm of RCA, 70% ISR of mid LCx with subsequent DES to mLCx and prox LCX. b. H/o microvascular angina. c. Recurrent angina 08/2014 - s/p PTCA/DES to prox Cx, PTCA/CBA to OM1.  c. LHC 06/10/15 with patent stents and some ISR in LCX and OM-1 that was not flow limiting --> Rx    Dyslipidemia    a. Intolerant to many statins except tolerating Livalo.   GERD (gastroesophageal reflux disease)    H/O cardiac catheterization 10/25/2018   Heart attack [facility])    Hypertension    Myocardial infarction [facility]) ~ 2010   S/P angioplasty with stent, DES, to proximal and mid LCX 12/15/11 12/15/2011   S/P foot surgery, right 04/2021  Shoulder pain    Stroke ([place])    pt. reports had a stroke around time of MI 2010   Type II diabetes mellitus ([place])    Unstable angina [facility])     Past Surgical History:  Procedure Laterality Date   CARDIAC CATHETERIZATION  06/15/2002   LAD with prox 40% stenosis, norma L main, Cfx with 25% lesion, RCA with long mid 25% stenosis (Dr. Vita Barley)   CARDIAC CATHETERIZATION  04/01/2010   normal L main, LAD wit mild stenosis, L Cfx with 70% in-stent restenosis, RCA with 70% in-stent restenosis, LVEF >60% (Dr. K. Mali Hilty) - cutting ballon arthrectomy to RCA & Cfx (Dr. Rockne Menghini)   CARDIAC CATHETERIZATION  08/25/2010   preserved global LV contractility; multivessel CAD, diffuse 90-95% in-stent restenosis in prox placed Cfx stent - cutting balloon arthrectomy in Cfx with multiple dilatations 90-95% to 0% stenosis (Dr. Corky Downs)   CARDIAC CATHETERIZATION  01/26/2011   PCI & stenting of aggresive in-stent restenosis within previously stented AV groove Cfx with 3.0x74m Taxus DES (previous stents were Promus) (Dr. JAdora Fridge   CARDIAC CATHETERIZATION  05/11/2011   preserved LV function, 40% mid LAD stenosis, 30-40% narrowing proximal to stented semgnet of prox Cfx, patent mid RCA stent with smooth 20% narrowing in distal RCA (Dr. TCorky Downs   CARDIAC CATHETERIZATION  12/15/2011   PCI & stenting of proximal & mid Cfx with DES - 3.0x185min proximal, 3.0x1562mn mid (Dr. J. Adora [place]idge [place] 06/10/2015   Procedure: Left Heart Cath and Coronary Angiography;  Surgeon: DanJolaine ArtistD;  [facility];  Service: Cardiovascular;  Laterality: N/A;   CARDIAC CATHETERIZATION  04/84/16/6063cardiometabolic testing  08/08/14/0109good exercise effort, peak VO2 79% predicted with normal VO2 HR curves (mild deconditioning)   COLONOSCOPY  12/2012   diminutive hyperplastic sigmoid poyp so repeat routine 2024   CORONARY BALLOON ANGIOPLASTY N/A 10/25/2018   Procedure: CORONARY BALLOON ANGIOPLASTY;  Surgeon: VarJettie BoozeD;  Location: [facility];  Service: Cardiovascular;  Laterality: N/A;   CORONARY BALLOON ANGIOPLASTY N/A 09/29/2019   Procedure: CORONARY BALLOON ANGIOPLASTY;  Surgeon: EndNelva BushD;  Location: [facility];  Service: Cardiovascular;  Laterality: N/A;   EXCISIONAL HEMORRHOIDECTOMY  1984   INTRAVASCULAR ULTRASOUND/IVUS N/A 09/29/2019   Procedure: Intravascular Ultrasound/IVUS;  Surgeon: EndNelva BushD;  Location: [facility];  Service: Cardiovascular;  Laterality: N/A;   LEFT HEART CATH AND  CORONARY ANGIOGRAPHY N/A 10/25/2018   Procedure: LEFT HEART CATH AND CORONARY ANGIOGRAPHY;  Surgeon: VarJettie BoozeD;  Location: [facility];  Service: Cardiovascular;  Laterality: N/A;   LEFT HEART CATH AND CORONARY ANGIOGRAPHY N/A 09/29/2019   Procedure: LEFT HEART CATH AND CORONARY ANGIOGRAPHY;  Surgeon: EndNelva BushD;  Location: [facility];  Service: Cardiovascular;  Laterality: N/A;   LEFT HEART CATHETERIZATION WITH CORONARY ANGIOGRAM N/A 05/11/2011   Procedure: LEFT HEART CATHETERIZATION WITH CORONARY ANGIOGRAM;  Surgeon: ThoTroy SineD;  [facility];  Service: Cardiovascular;  Laterality: N/A;  Possible percutaneous coronary intervention, possible IVUS   LEFT HEART CATHETERIZATION WITH CORONARY ANGIOGRAM N/A 12/15/2011   Procedure: LEFT HEART CATHETERIZATION WITH CORONARY ANGIOGRAM;  Surgeon: JonLorretta HarpD;  [facility];  Service: Cardiovascular;  Laterality: N/A;   LEFT HEART CATHETERIZATION WITH CORONARY ANGIOGRAM N/A 09/05/2014   Procedure: LEFT HEART CATHETERIZATION WITH CORONARY ANGIOGRAM;  Surgeon: ChrHarrell Gave  Santina Evans, MD;  Location: [place] C[facility] LAB;  Service: Cardiovascular;  Laterality: N/A;   LIPOMA EXCISION     back of the head   NM MYOCAR PERF WALL MOTION  02/2012   lexiscan myoview; mild perfusion defect in mid inferolateral & basal inferolateral region (infarct/scar); EF 52%, abnormal but ow risk scan   PERCUTANEOUS CORONARY STENT INTERVENTION (PCI-S)  09/05/2014   Procedure: PERCUTANEOUS CORONARY STENT INTERVENTION (PCI-S);  Surgeon: Burnell Blanks, MD;  Location: [facility];  Service: Cardiovascular;;   TONSILLECTOMY      Family Psychiatric History: Maternal grandfather alcohol use, maternal and paternal uncles and aunts alcohol use, oldest brother/sister alcohol and illegal substance.     Family History:  Family History  Problem Relation Age of Onset   Leukemia Mother    Prostate cancer Father    Cancer  Brother    Coronary artery disease Paternal Grandmother    Cancer Paternal Grandfather    Migraines Neg Hx    Headache Neg Hx     Social History:  Social History   Socioeconomic History   Marital status: Widowed    Spouse name: Not on file   Number of children: 2   Years of education: GED   Highest education level: Not on file  Occupational History   Not on file  Tobacco Use   Smoking status: Former    Packs/day: 1.00    Years: 10.00    Pack years: 10.00    Types: Cigarettes    Quit date: 10/07/2018    Years since quitting: 3.1   Smokeless tobacco: Never  Vaping Use   Vaping Use: Never used  Substance and Sexual Activity   Alcohol use: Not Currently    Comment: 2 bottles of wine on 12/08/2019 (one occurrence)   Drug use: Not Currently    Types: Cocaine    Comment: last use several months ago   Sexual activity: Not Currently  Other Topics Concern   Not on file  Social History Narrative   ** Merged History Encounter **       Social Determinants of Health   Financial Resource Strain: Not on file  Food Insecurity: Not on file  Transportation Needs: Not on file  Physical Activity: Not on file  Stress: Not on file  Social Connections: Not on file    Allergies:  Allergies  Allergen Reactions   Bee Venom Anaphylaxis and Hives   Gadolinium Hives, Itching, Swelling and Other (See Comments)    Swelling of eyes after receiving MR contrast. 13 hr prep recommended. Pt developed large hive and swelling under right eye after contrast, given 60m iv benadryl, pt will need premed before gadolinium//lh   Shellfish Allergy Anaphylaxis and Hives   Statins Other (See Comments)    Myalgias. Tolerating livalo. Pain    Testosterone Cypionate Other (See Comments)    Testerone Injection --Increased breast tissue     Metabolic Disorder Labs: Lab Results  Component Value Date   HGBA1C 6.3 (A) 08/04/2021   HGBA1C 6.3 08/04/2021   HGBA1C 6.3 08/04/2021   HGBA1C 6.3 08/04/2021    MPG 136.98 03/16/2020   MPG 151.33 12/12/2019   Lab Results  Component Value Date   PROLACTIN 13.0 03/17/2020   Lab Results  Component Value Date   CHOL 144 07/07/2021   TRIG 45 07/07/2021   HDL 44 07/07/2021   CHOLHDL 3.3 07/07/2021   VLDL 9 07/07/2021   LDLCALC 91 07/07/2021   LDLCALC 90 03/05/2021  Lab Results  Component Value Date   TSH 3.372 07/07/2021   TSH 1.089 03/19/2020    Therapeutic Level Labs: No results found for: LITHIUM No results found for: VALPROATE No components found for:  CBMZ  Current Medications: Current Outpatient Medications  Medication Sig Dispense Refill   Accu-Chek Softclix Lancets lancets SMARTSIG:1 Topical 4 Times Daily     amLODipine (NORVASC) 10 MG tablet Take 1 tablet by mouth once daily 90 tablet 1   aspirin 81 MG EC tablet Take 1 tablet (81 mg total) by mouth daily. (May buy from over the counter): Swallow whole for heart health 30 tablet 0   blood glucose meter kit and supplies KIT Dispense based on patient and insurance preference. Use up to four times daily as directed. (FOR ICD-9 250.00, 250.01). 1 each 0   Blood Pressure Monitor KIT 1 kit by Does not apply route daily. 1 kit 0   clopidogrel (PLAVIX) 75 MG tablet Take 1 tablet by mouth once daily 90 tablet 1   EPINEPHrine 0.3 mg/0.3 mL IJ SOAJ injection Inject 0.3 mg into the muscle as needed for anaphylaxis. 1 each 2   ezetimibe (ZETIA) 10 MG tablet Take 1 tablet (10 mg total) by mouth daily. 90 tablet 3   glucose blood (ACCU-CHEK GUIDE) test strip Use as instructed 300 each 3   hydrOXYzine (ATARAX) 25 MG tablet Take 1 tablet (25 mg total) by mouth 3 (three) times daily as needed for anxiety. 90 tablet 3   LIVALO 4 MG TABS Take 1 tablet (4 mg total) by mouth daily. (Patient taking differently: Take 4 mg by mouth daily.) 90 tablet 3   metFORMIN (GLUCOPHAGE) 500 MG tablet Take 1 tablet (500 mg total) by mouth 2 (two) times daily with a meal. 60 tablet 0   metoprolol tartrate  (LOPRESSOR) 50 MG tablet Take 1 tablet (50 mg total) by mouth 2 (two) times daily. (Patient taking differently: Take 100 mg by mouth 2 (two) times daily.) 60 tablet 0   mupirocin ointment (BACTROBAN) 2 % Apply 1 application. topically 2 (two) times daily. 22 g 0   nitroGLYCERIN (NITROSTAT) 0.4 MG SL tablet Place 1 tablet (0.4 mg total) under the tongue every 5 (five) minutes x 3 doses as needed for chest pain. 25 tablet 3   Omega-3 Fatty Acids (FISH OIL PO) Take 1 capsule by mouth daily.     SF 5000 PLUS 1.1 % CREA dental cream Place 1 application onto teeth at bedtime as needed (For teeth sensitivity).     sildenafil (VIAGRA) 100 MG tablet Take 1 tablet (100 mg total) by mouth daily as needed. (Patient taking differently: Take 100 mg by mouth daily as needed for erectile dysfunction.) 10 tablet 5   Testosterone (ANDROGEL PUMP) 20.25 MG/ACT (1.62%) GEL Place 1 Pump onto the skin daily. 75 g 2   No current facility-administered medications for this visit.     Musculoskeletal: Strength & Muscle Tone: within normal limits, telehealth visit Gait & Station: normal, telehealth visit Patient leans: N/A  Psychiatric Specialty Exam: Review of Systems  There were no vitals taken for this visit.There is no height or weight on file to calculate BMI.  General Appearance: Well Groomed  Eye Contact:  Good  Speech:  Clear and Coherent and Normal Rate  Volume:  Normal  Mood:  Anxious  Affect:  Appropriate and Congruent  Thought Process:  Coherent, Goal Directed, and Linear  Orientation:  Full (Time, Place, and Person)  Thought Content: WDL and  Logical   Suicidal Thoughts:  No  Homicidal Thoughts:  No  Memory:  Immediate;   Good Recent;   Good Remote;   Good  Judgement:  Good  Insight:  Good  Psychomotor Activity:  Normal  Concentration:  Concentration: Good and Attention Span: Good  Recall:  Good  Fund of Knowledge: Good  Language: Good  Akathisia:  No  Handed:  Right  AIMS (if indicated):  not done  Assets:  Communication Skills Desire for Improvement Financial Resources/Insurance Housing Social Support  ADL's:  Intact  Cognition: WNL  Sleep:  Good   Screenings: AIMS    Flowsheet Row Admission (Discharged) from OP Visit from 09/07/2019 in [place] [facility] Admission (Discharged) from OP Visit from 01/12/2019 in [place] [facility] Admission (Discharged) from OP Visit from 08/15/2018 in [place] [facility] Admission (Discharged) from OP Visit from 10/02/2017 in [place] [facility] Admission (Discharged) from 06/22/2017 in [facility]  AIMS Total Score 0 0 0 0 0      AUDIT    Flowsheet Row Office Visit from 08/27/2020 in [facility] Admission (Discharged) from OP Visit from 03/18/2020 in [place] [facility] Admission (Discharged) from 12/10/2019 in [place] [facility] Admission (Discharged) from OP Visit from 09/07/2019 in [place] [facility] Admission (Discharged) from OP Visit from 01/12/2019 in [facility] [facility]  Alcohol Use Disorder Identification Test Final Score (AUDIT) 27 28 13 25  0      GAD-7    Flowsheet Row Video Visit from 11/19/2021 in [place] from 07/07/2021 in [facility] Office Visit from 08/27/2020 in [facility]  Total GAD-7 Score 13 19 7       PHQ2-9    Flowsheet Row Video Visit from 11/19/2021 in [facility] Office Visit from 08/04/2021 in [place] from 07/07/2021 in [facility] Office Visit from 04/24/2021 in [place] Office Visit from 03/05/2021 in [place]  PHQ-2 Total Score 1 0 5 0 0  PHQ-9 Total Score 2 -- 22 -- 3      Flowsheet Row ED from 10/10/2021 in [facility] Most recent reading at 10/10/2021  8:21 AM ED from 07/07/2021 in [facility] Most recent reading at 07/07/2021  2:29 PM Clinical Support from 07/07/2021 in [facility] Most recent reading at 07/07/2021  9:57 AM  C-SSRS RISK CATEGORY No Risk Low Risk Error: Q6 is Yes, you must answer 7        Assessment and Plan: Patient endorses symptoms of anxiety and notes that his depression has improved.  At this time he will restart hydroxyzine 25 mg 3 times a day to help manage anxiety.  He does not wish to restart trazodone, buspirone, or Abilify.  1. Generalized anxiety disorder  Restart- hydrOXYzine (ATARAX) 25 MG tablet; Take 1 tablet (25 mg total) by mouth 3 (three) times daily as needed for anxiety.  Dispense: 90 tablet; Refill: 3   Follow-up in 34-monthFollow-up with therapy    BSalley Slaughter NP 11/19/2021, 8:51 AM

## 2021-12-04 ENCOUNTER — Ambulatory Visit (INDEPENDENT_AMBULATORY_CARE_PROVIDER_SITE_OTHER): Payer: Medicaid Other | Admitting: Nurse Practitioner

## 2021-12-04 ENCOUNTER — Ambulatory Visit: Payer: Self-pay | Admitting: Nurse Practitioner

## 2021-12-04 ENCOUNTER — Encounter: Payer: Self-pay | Admitting: Nurse Practitioner

## 2021-12-04 VITALS — BP 117/80 | HR 53 | Resp 18 | Wt 188.8 lb

## 2021-12-04 DIAGNOSIS — I10 Essential (primary) hypertension: Secondary | ICD-10-CM | POA: Diagnosis not present

## 2021-12-04 DIAGNOSIS — E1159 Type 2 diabetes mellitus with other circulatory complications: Secondary | ICD-10-CM | POA: Diagnosis not present

## 2021-12-04 MED ORDER — METOPROLOL TARTRATE 50 MG PO TABS: 100.0000 mg | ORAL_TABLET | Freq: Two times a day (BID) | ORAL | 0 refills | Status: DC

## 2021-12-04 MED ORDER — METFORMIN HCL 500 MG PO TABS: 500.0000 mg | ORAL_TABLET | Freq: Two times a day (BID) | ORAL | 0 refills | Status: DC

## 2021-12-04 NOTE — Assessment & Plan Note (Signed)
-   metFORMIN (GLUCOPHAGE) 500 MG tablet; Take 1 tablet (500 mg total) by mouth 2 (two) times daily with a meal.  Dispense: 60 tablet; Refill: 0 - Hemoglobin A1c - CBC - Comprehensive metabolic panel  2. Essential hypertension  - metoprolol tartrate (LOPRESSOR) 50 MG tablet; Take 2 tablets (100 mg total) by mouth 2 (two) times daily.  Dispense: 90 tablet; Refill: 0   Follow up:  Follow  in 3 months or sooner if needed

## 2021-12-04 NOTE — Patient Instructions (Signed)
1. Type 2 diabetes mellitus with other circulatory complication, without long-term current use of insulin ([name])  - metFORMIN (GLUCOPHAGE) 500 MG tablet; Take 1 tablet (500 mg total) by mouth 2 (two) times daily with a meal.  Dispense: 60 tablet; Refill: 0 - Hemoglobin A1c - CBC - Comprehensive metabolic panel  2. Essential hypertension  - metoprolol tartrate (LOPRESSOR) 50 MG tablet; Take 2 tablets (100 mg total) by mouth 2 (two) times daily.  Dispense: 90 tablet; Refill: 0   Follow up:  Follow  in 3 months or sooner if needed

## 2021-12-04 NOTE — Progress Notes (Signed)
$'@Patient'w$  ID: [name], male    DOB: February 18, 1961, 61 y.o.   MRN: [id number]  Chief Complaint  Patient presents with   Follow-up    Referring provider: Vevelyn Francois, NP   HPI  [name] presents for follow up. She  has a past medical history of Alcohol dependence ([place]), Allergies, Arthritis, Chest pain, Chronic lower back pain, Chronic pain of right wrist, Coronary artery disease, Dyslipidemia, GERD (gastroesophageal reflux disease), H/O cardiac catheterization (10/25/2018), Heart attack ([place]), Hypertension, Myocardial infarction ([place]) (~ 2010), S/P angioplasty with stent, DES, to proximal and mid LCX 12/15/11 (12/15/2011), S/P foot surgery, right (04/2021), Shoulder pain, Stroke ([place]), Type II diabetes mellitus ([place]), and Unstable angina ([place]).   Mr. Richison is in today for diabetes follow up. The prescribed treatment is metformin 500 mg  along with therapy zetia and livalo. The reported use of treatment is consistent with prescribed. He denies reported side effects from the treatment. Home glucose monitoring indicates a CBG normal range. His goal is to consisently eating healthy. He continues to have the fatigue. He states that he has not been doing well with diet or exercise. Denies fever, chills, headache, dizziness, visual changes, polydipsia,  polyphagia shortness of breath, dyspnea on exertion, chest pain, abdominal pain, nausea, vomiting, polyuria, constipation, diarrhea,  any edema, numbness, tingling, burning of hand or feet. There has been a professional eye exam in the year. Closely followed podiatry. He is going to have surgery on his foot to remove the pin. He has not been able to work out.     He reports that he has note taken his medication. He continues to take metoprolol 100 mg BID. He does not monitor BP or HR at home. Heartrate 68 today.   Cardiology - refilling htn medications  Ws told to stay on metoprolol 100 mg bid by cardiology    Allergies  Allergen Reactions    Bee Venom Anaphylaxis and Hives   Gadolinium Hives, Itching, Swelling and Other (See Comments)    Swelling of eyes after receiving MR contrast. 13 hr prep recommended. Pt developed large hive and swelling under right eye after contrast, given $RemoveBeforeD'50mg'ClYMCaxlAEUXND$  iv benadryl, pt will need premed before gadolinium//lh   Shellfish Allergy Anaphylaxis and Hives   Statins Other (See Comments)    Myalgias. Tolerating livalo. Pain    Testosterone Cypionate Other (See Comments)    Testerone Injection --Increased breast tissue     Immunization History  Administered Date(s) Administered   Influenza,inj,Quad PF,6+ Mos 07/28/2017, 04/01/2018, 03/05/2021   MODERNA COVID-19 SARS-COV-2 PEDS BIVALENT BOOSTER 6Y-11Y 12/09/2019, 01/10/2020   Moderna SARS-COV2 Booster Vaccination 08/13/2020   Pneumococcal Polysaccharide-23 07/28/2017, 03/05/2021   Pneumococcal-Unspecified 06/29/2009   Tdap 02/08/2020   Zoster Recombinat (Shingrix) 01/11/2013    Past Medical History:  Diagnosis Date   Alcohol dependence ([place])    Allergies    Arthritis    Chest pain    Chronic lower back pain    Chronic pain of right wrist    Coronary artery disease    a. Multiple prior caths/PCI. Cath 2013 with possible spasm of RCA, 70% ISR of mid LCx with subsequent DES to mLCx and prox LCX. b. H/o microvascular angina. c. Recurrent angina 08/2014 - s/p PTCA/DES to prox Cx, PTCA/CBA to OM1.  c. LHC 06/10/15 with patent stents and some ISR in LCX and OM-1 that was not flow limiting --> Rx    Dyslipidemia    a. Intolerant to many statins except tolerating Livalo.  GERD (gastroesophageal reflux disease)    H/O cardiac catheterization 10/25/2018   Heart attack [facility])    Hypertension    Myocardial infarction [facility]) ~ 2010   S/P angioplasty with stent, DES, to proximal and mid LCX 12/15/11 12/15/2011   S/P foot surgery, right 04/2021   Shoulder pain    Stroke ([place])    pt. reports had a stroke around time of MI 2010   Type II diabetes mellitus  ([place])    Unstable angina ([name])     Tobacco History: Social History   Tobacco Use  Smoking Status Former   Packs/day: 1.00   Years: 10.00   Total pack years: 10.00   Types: Cigarettes   Quit date: 10/07/2018   Years since quitting: 3.1  Smokeless Tobacco Never   Counseling given: Not Answered   Outpatient Encounter Medications as of 12/04/2021  Medication Sig   Accu-Chek Softclix Lancets lancets SMARTSIG:1 Topical 4 Times Daily   amLODipine (NORVASC) 10 MG tablet Take 1 tablet by mouth once daily   aspirin 81 MG EC tablet Take 1 tablet (81 mg total) by mouth daily. (May buy from over the counter): Swallow whole for heart health   blood glucose meter kit and supplies KIT Dispense based on patient and insurance preference. Use up to four times daily as directed. (FOR ICD-9 250.00, 250.01).   Blood Pressure Monitor KIT 1 kit by Does not apply route daily.   clopidogrel (PLAVIX) 75 MG tablet Take 1 tablet by mouth once daily   EPINEPHrine 0.3 mg/0.3 mL IJ SOAJ injection Inject 0.3 mg into the muscle as needed for anaphylaxis.   ezetimibe (ZETIA) 10 MG tablet Take 1 tablet (10 mg total) by mouth daily.   glucose blood (ACCU-CHEK GUIDE) test strip Use as instructed   hydrOXYzine (ATARAX) 25 MG tablet Take 1 tablet (25 mg total) by mouth 3 (three) times daily as needed for anxiety.   LIVALO 4 MG TABS Take 1 tablet (4 mg total) by mouth daily. (Patient taking differently: Take 4 mg by mouth daily.)   metFORMIN (GLUCOPHAGE) 500 MG tablet Take 1 tablet (500 mg total) by mouth 2 (two) times daily with a meal.   metoprolol tartrate (LOPRESSOR) 50 MG tablet Take 2 tablets (100 mg total) by mouth 2 (two) times daily.   mupirocin ointment (BACTROBAN) 2 % Apply 1 application. topically 2 (two) times daily.   nitroGLYCERIN (NITROSTAT) 0.4 MG SL tablet Place 1 tablet (0.4 mg total) under the tongue every 5 (five) minutes x 3 doses as needed for chest pain.   Omega-3 Fatty Acids (FISH OIL PO) Take 1  capsule by mouth daily.   SF 5000 PLUS 1.1 % CREA dental cream Place 1 application onto teeth at bedtime as needed (For teeth sensitivity).   sildenafil (VIAGRA) 100 MG tablet Take 1 tablet (100 mg total) by mouth daily as needed. (Patient taking differently: Take 100 mg by mouth daily as needed for erectile dysfunction.)   Testosterone (ANDROGEL PUMP) 20.25 MG/ACT (1.62%) GEL Place 1 Pump onto the skin daily.   [DISCONTINUED] metFORMIN (GLUCOPHAGE) 500 MG tablet Take 1 tablet (500 mg total) by mouth 2 (two) times daily with a meal.   [DISCONTINUED] metoprolol tartrate (LOPRESSOR) 50 MG tablet Take 1 tablet (50 mg total) by mouth 2 (two) times daily. (Patient taking differently: Take 100 mg by mouth 2 (two) times daily.)   No facility-administered encounter medications on file as of 12/04/2021.   Filed Weights   12/04/21 0812  Weight:  188 lb 12.8 oz (85.6 kg)     Review of Systems  Review of Systems  Constitutional: Negative.   HENT: Negative.    Cardiovascular: Negative.   Gastrointestinal: Negative.   Allergic/Immunologic: Negative.   Neurological: Negative.   Psychiatric/Behavioral: Negative.         Physical Exam  BP 117/80   Pulse (!) 53   Resp 18   Wt 188 lb 12.8 oz (85.6 kg)   SpO2 100%   BMI 28.71 kg/m   Wt Readings from Last 5 Encounters:  12/04/21 188 lb 12.8 oz (85.6 kg)  08/04/21 192 lb (87.1 kg)  07/30/21 192 lb 12.8 oz (87.5 kg)  07/18/21 192 lb 6.4 oz (87.3 kg)  04/24/21 193 lb 9.6 oz (87.8 kg)     Physical Exam Vitals and nursing note reviewed.  Constitutional:      General: He is not in acute distress.    Appearance: He is well-developed.  Cardiovascular:     Rate and Rhythm: Normal rate and regular rhythm.  Pulmonary:     Effort: Pulmonary effort is normal.     Breath sounds: Normal breath sounds.  Skin:    General: Skin is warm and dry.  Neurological:     Mental Status: He is alert and oriented to person, place, and time.      Lab  Results:  CBC    Component Value Date/Time   WBC 4.8 07/07/2021 1135   RBC 5.00 07/07/2021 1135   HGB 13.9 07/07/2021 1135   HGB 14.5 10/31/2020 1230   HCT 41.7 07/07/2021 1135   HCT 44.2 10/31/2020 1230   PLT 229 07/07/2021 1135   PLT 246 10/31/2020 1230   MCV 83.4 07/07/2021 1135   MCV 85 10/31/2020 1230   MCH 27.8 07/07/2021 1135   MCHC 33.3 07/07/2021 1135   RDW 12.3 07/07/2021 1135   RDW 12.7 10/31/2020 1230   LYMPHSABS 1.9 07/07/2021 1135   LYMPHSABS 1.9 10/31/2020 1230   MONOABS 0.4 07/07/2021 1135   EOSABS 0.2 07/07/2021 1135   EOSABS 0.3 10/31/2020 1230   BASOSABS 0.1 07/07/2021 1135   BASOSABS 0.1 10/31/2020 1230    BMET    Component Value Date/Time   NA 136 07/07/2021 1135   NA 142 03/05/2021 1046   K 4.3 07/07/2021 1135   CL 104 07/07/2021 1135   CO2 24 07/07/2021 1135   GLUCOSE 144 (H) 07/07/2021 1135   BUN 15 07/07/2021 1135   BUN 13 03/05/2021 1046   CREATININE 0.92 07/07/2021 1135   CREATININE 0.87 12/08/2016 0900   CALCIUM 9.2 07/07/2021 1135   GFRNONAA >60 07/07/2021 1135   GFRNONAA >89 12/08/2016 0900   GFRAA >60 03/24/2020 0750   GFRAA >89 12/08/2016 0900    BNP    Component Value Date/Time   BNP 31.4 09/28/2019 0959    ProBNP No results found for: "PROBNP"  Imaging: No results found.   Assessment & Plan:   DM2 (diabetes mellitus, type 2) ([name]) - metFORMIN (GLUCOPHAGE) 500 MG tablet; Take 1 tablet (500 mg total) by mouth 2 (two) times daily with a meal.  Dispense: 60 tablet; Refill: 0 - Hemoglobin A1c - CBC - Comprehensive metabolic panel  2. Essential hypertension  - metoprolol tartrate (LOPRESSOR) 50 MG tablet; Take 2 tablets (100 mg total) by mouth 2 (two) times daily.  Dispense: 90 tablet; Refill: 0   Follow up:  Follow  in 3 months or sooner if needed     Fenton Foy, NP  12/04/2021  

## 2021-12-05 LAB — HEMOGLOBIN A1C
Est. average glucose Bld gHb Est-mCnc: 154 mg/dL
Hgb A1c MFr Bld: 7 % — ABNORMAL HIGH (ref 4.8–5.6)

## 2021-12-05 LAB — COMPREHENSIVE METABOLIC PANEL
ALT: 53 IU/L — ABNORMAL HIGH (ref 0–44)
AST: 22 IU/L (ref 0–40)
Albumin/Globulin Ratio: 2 (ref 1.2–2.2)
Albumin: 4.7 g/dL (ref 3.8–4.9)
Alkaline Phosphatase: 74 IU/L (ref 44–121)
BUN/Creatinine Ratio: 14 (ref 10–24)
BUN: 14 mg/dL (ref 8–27)
Bilirubin Total: 0.3 mg/dL (ref 0.0–1.2)
CO2: 22 mmol/L (ref 20–29)
Calcium: 9.4 mg/dL (ref 8.6–10.2)
Chloride: 105 mmol/L (ref 96–106)
Creatinine, Ser: 1.03 mg/dL (ref 0.76–1.27)
Globulin, Total: 2.3 g/dL (ref 1.5–4.5)
Glucose: 157 mg/dL — ABNORMAL HIGH (ref 70–99)
Potassium: 4.5 mmol/L (ref 3.5–5.2)
Sodium: 140 mmol/L (ref 134–144)
Total Protein: 7 g/dL (ref 6.0–8.5)
eGFR: 83 mL/min/{1.73_m2} (ref >59)

## 2021-12-05 LAB — LIPID PANEL
Chol/HDL Ratio: 3.1 ratio (ref 0.0–5.0)
Cholesterol, Total: 133 mg/dL (ref 100–199)
HDL: 43 mg/dL (ref >39)
LDL Chol Calc (NIH): 67 mg/dL (ref 0–99)
Triglycerides: 128 mg/dL (ref 0–149)
VLDL Cholesterol Cal: 23 mg/dL (ref 5–40)

## 2021-12-05 LAB — CBC
Hematocrit: 43 % (ref 37.5–51.0)
Hemoglobin: 14.3 g/dL (ref 13.0–17.7)
MCH: 28.1 pg (ref 26.6–33.0)
MCHC: 33.3 g/dL (ref 31.5–35.7)
MCV: 85 fL (ref 79–97)
Platelets: 240 10*3/uL (ref 150–450)
RBC: 5.09 x10E6/uL (ref 4.14–5.80)
RDW: 12.8 % (ref 11.6–15.4)
WBC: 5 10*3/uL (ref 3.4–10.8)

## 2021-12-10 ENCOUNTER — Telehealth: Payer: Self-pay | Admitting: Licensed Clinical Social Worker

## 2021-12-10 ENCOUNTER — Encounter: Payer: Self-pay | Admitting: Internal Medicine

## 2021-12-10 ENCOUNTER — Ambulatory Visit (INDEPENDENT_AMBULATORY_CARE_PROVIDER_SITE_OTHER): Payer: Medicaid Other | Admitting: Internal Medicine

## 2021-12-10 VITALS — BP 122/75 | HR 59 | Ht 68.0 in | Wt 189.2 lb

## 2021-12-10 DIAGNOSIS — I10 Essential (primary) hypertension: Secondary | ICD-10-CM

## 2021-12-10 DIAGNOSIS — R0781 Pleurodynia: Secondary | ICD-10-CM

## 2021-12-10 DIAGNOSIS — Z720 Tobacco use: Secondary | ICD-10-CM

## 2021-12-10 DIAGNOSIS — E785 Hyperlipidemia, unspecified: Secondary | ICD-10-CM

## 2021-12-10 DIAGNOSIS — F1020 Alcohol dependence, uncomplicated: Secondary | ICD-10-CM | POA: Diagnosis not present

## 2021-12-10 MED ORDER — METOPROLOL TARTRATE 100 MG PO TABS: 100.0000 mg | ORAL_TABLET | Freq: Two times a day (BID) | ORAL | 3 refills | Status: DC

## 2021-12-10 NOTE — Patient Instructions (Signed)
Medication Instructions:  Metoprolol Tartrate has been changed to '100mg'$  tablets - take 1 tablet twice daily   *If you need a refill on your cardiac medications before your next appointment, please call your pharmacy*  Follow-Up: At [facility], you and your health needs are our priority.  As part of our continuing mission to provide you with exceptional heart care, we have created designated Provider Care Teams.  These Care Teams include your primary Cardiologist (physician) and Advanced Practice Providers (APPs -  Physician Assistants and Nurse Practitioners) who all work together to provide you with the care you need, when you need it.  We recommend signing up for the patient portal called "MyChart".  Sign up information is provided on this After Visit Summary.  MyChart is used to connect with patients for Virtual Visits (Telemedicine).  Patients are able to view lab/test results, encounter notes, upcoming appointments, etc.  Non-urgent messages can be sent to your provider as well.   To learn more about what you can do with MyChart, go to [email or web address].    Your next appointment:   6 month(s)  The format for your next appointment:   In Person  Provider:   Pixie Casino, MD {

## 2021-12-10 NOTE — Progress Notes (Signed)
OFFICE NOTE  Chief Complaint:  Follow-up, chest pain  Primary Care Physician: Fenton Foy, NP  HPI:  [name] a 61 year old gentleman who has a history of coronary disease, hypertension, dyslipidemia, diabetes type 2, and microvascular angina. He has had multiple PCIs, in-stent restenosis, but has been actually fairly stable since his last drug-eluting stent placement in June 2013. He had a nuclear stress test which showed some mild abnormalities and was considered low risk and has been treated medically.  He had chest pain in January 2014 and was seen at [facility]; however, no intervention was performed at that time. Recently he had some dyspepsia treated by a PPI which has resolved. He has had a little bit of weight gain and decreased exercise, which he is working on, and some dietary indiscretion. He also had a metabolic test in our office just on August 08, 2012, which showed good exercise effort, peak VO2 of 79% predicted with normal VO2 heart-rate curves indicating only mild deconditioning but no evidence of ischemia.   Since his last office visit he is adopted a vegetarian lifestyle and has managed to lose almost 20 pounds. He continues to exercise and his made great strides as far as his symptoms are concerned. He still has occasional episodes of angina which are nitrate responsive. He is concerned however about taking higher doses of Imdur as he read that it may not be effective in the long term.  He is inquiring about possibly coming off the medication if possible. He is no longer taking little low as he has made dietary changes and wishes to use a natural approach rather than statin medication.  I've reminded him that because he has significant coronary disease and multiple stents that statins are still indicated despite dietary changes.  [name] is doing well today. Denies any chest pain. He is interested in seeing what his cholesterol is coming down to now that he's been on a  vegan and diet. He is not on any cholesterol medications. He also reports some sexual dysfunction and is interested in Viagra.  01/13/2016  [name] returns today for follow-up. He is doing exceedingly well. He continues to exercise. He had switched to a vegan diet but recently had to introduce some dairy. His weight is down another 10 pounds and again he has no anginal symptoms at all. He is interested actually in coming off of Plavix today. It's been more than a year since his stent and since he's been free of angina I think that's very reasonable. Recent cholesterol testing shows marked improvement in his numbers including an LDL of 66 and total cholesterol of 132.  01/04/2017  [name] was seen today in follow-up. He continues to do well. He denies any new chest pain or worsening shortness of breath. He continues exercise regularly. Weight is down further from 175-168 and he is essentially at goal. Blood pressure is well-controlled today 114/82. His EKG is personally reviewed shows sinus bradycardia 50 with no ischemic changes. He recently had lab work showed total cholesterol 195, Trilisate 87, HDL 46 and LDL-C at 132. Of note his LDL-C was 66 last year. When queried about this he reported his insurance, he said that he did not need the medicine anymore and would not cover it. Clearly although he is optimize regarding diet and exercise his cholesterol remains elevated. His goal LDL-C is less than 70 given his coronary artery disease and he will not recheck goal without medication. Unfortunately he  was intolerant to a number of statins in the past including atorvastatin, simvastatin and rosuvastatin. He's also recently been complaining of some peripheral neuropathy. He is not on medication yet for this but reports numbness and tingling in his hands and feet. He would live and A1c however is now better controlled at 5.4.  11/15/2018  [name] returns today for follow-up.  He is complaining of some  persistent chest pain that seems to started after his recent coronary intervention.  In April he was seen in the hospital with recurrent chest pain.  He underwent left heart catheterization was found to have in-stent restenosis of the circumflex and OM branches which was intervened upon using a cutting balloon angioplasty.  After that he had resolution of his symptoms which were mostly with exertion and relieved by rest.  Since then he has had chest discomfort which is somewhat persistent.  It does not seem to be worse with exertion or relieved by rest, for example he was able to ride his bicycle to the office today without any issues, but says that he can have chest discomfort sitting in bed and reading a book.  He was seen in the office by Almyra Deforest, PA-C who placed him on long-acting Imdur.  This did not help his symptoms.  Subsequently he was seen in the ER who uptitrated his Imdur from 30 to 60 mg and increased his beta-blocker.  Although his blood pressure is better and heart rate is lower, his chest pain has not improved.  He describes it as a right-sided chest discomfort which is not worse with taking a deep breath or movement and again is more often at rest.  It does not radiate.  05/07/2020  [name] is seen today in follow-up.  He was treated earlier this year and had an angioplasty to the circumflex/OM 2 for in-stent restenosis which decreased the degree of stenosis from 95 to 30%.  Dual antiplatelet therapy with aspirin and Brilinta was recommended for least a year which will be reevaluated in April.  Ultimately he may remain on aspirin and Plavix.  He was also seen more recently in the emergency department for chest pain thought to be atypical.  I recommended Ranexa to the ER physician however he never started on the medicine.  Since then though he says his symptoms have improved.  He denies any recurrent chest pain.  He is made some dietary modifications.  He still active and rides his bicycle every  day.  Unfortunately he has had some falls due to back problems and has a need for upcoming surgery to his right great toe.  Pressures well controlled today.  Recent lipids showed total cholesterol of 168, HDL 45, LDL 76 and triglycerides 237.  This was also associated with an A1c increase however he has been started on Metformin and is on October 4 A1c is 6.  03/11/2021  [name] returns today for follow-up.  He continues to do well.  He is now more than a year and a half out from angioplasty.  I did switch him from Brilinta over to Plavix.  Given the extent and recurrence of in-stent restenosis, I would favor keeping him on indefinite aspirin and Plavix.  The Plavix could be held if necessary for procedures.  Overall he denies any chest pain.  He says he is physically active.  His main issue struggling with some headaches.  He is scheduled to see a neurologist in October.  Blood pressure was good today.  His cholesterol is trended up a little with total 152, HDL 44, triglycerides 99 and LDL 90, this was obtained just a few days ago.  He is on :Livalo 4 mg daily.  07/18/2021  [name] is seen today in follow-up.  Overall he is doing fairly well.  Unfortunately was not able to lower the cholesterol more.  His LDL remains at 91 based on recent labs.  Triglycerides have been elevated in the past however they have improved significantly are now normal at 45.  I do think he would benefit from additional lipid lowering and would recommend adding ezetimibe to his regimen.  Other than that he denies any anginal symptoms.  He says he might need another foot surgery with Dr. Paulla Dolly.  He would likely have to stop his aspirin and Plavix for this.  12/10/2021  [name] returns today for follow-up.  He has undergone 2 orthopedic surgery since I last saw him, the last of which was to remove some rods from his foot.  He continues to have pain with that.  He does have unfortunately longstanding history of alcohol dependence.   He said he was sober for about 4 years but recently started drinking again.  He is now drinking wine probably excessively on a daily basis and is intermittently smoking with this.  When he smokes he says he gets chest pain which sounds more pleuritic.  It does not sound like angina.  He is also backed off on exercise and gained some weight.  Diet is not as ideal.  He notes his A1c is gone up and he does have some mild increase in his liver enzymes.  He is interested in inpatient alcohol rehab.  He says he has failed outpatient rehab multiple times but has had success with inpatient previously.  He said he would need a referral for that.   PMHx:  Past Medical History:  Diagnosis Date   Alcohol dependence ([place])    Allergies    Arthritis    Chest pain    Chronic lower back pain    Chronic pain of right wrist    Coronary artery disease    a. Multiple prior caths/PCI. Cath 2013 with possible spasm of RCA, 70% ISR of mid LCx with subsequent DES to mLCx and prox LCX. b. H/o microvascular angina. c. Recurrent angina 08/2014 - s/p PTCA/DES to prox Cx, PTCA/CBA to OM1.  c. LHC 06/10/15 with patent stents and some ISR in LCX and OM-1 that was not flow limiting --> Rx    Dyslipidemia    a. Intolerant to many statins except tolerating Livalo.   GERD (gastroesophageal reflux disease)    H/O cardiac catheterization 10/25/2018   Heart attack [facility])    Hypertension    Myocardial infarction [facility]) ~ 2010   S/P angioplasty with stent, DES, to proximal and mid LCX 12/15/11 12/15/2011   S/P foot surgery, right 04/2021   Shoulder pain    Stroke ([place])    pt. reports had a stroke around time of MI 2010   Type II diabetes mellitus ([place])    Unstable angina [facility])     Past Surgical History:  Procedure Laterality Date   CARDIAC CATHETERIZATION  06/15/2002   LAD with prox 40% stenosis, norma L main, Cfx with 25% lesion, RCA with long mid 25% stenosis (Dr. Vita Barley)   CARDIAC CATHETERIZATION  04/01/2010   normal L  main, LAD wit mild stenosis, L Cfx with 70% in-stent restenosis, RCA with 70% in-stent  restenosis, LVEF >60% (Dr. K. Mali Thompson Mckim) - cutting ballon arthrectomy to RCA & Cfx (Dr. Rockne Menghini)   CARDIAC CATHETERIZATION  08/25/2010   preserved global LV contractility; multivessel CAD, diffuse 90-95% in-stent restenosis in prox placed Cfx stent - cutting balloon arthrectomy in Cfx with multiple dilatations 90-95% to 0% stenosis (Dr. Corky Downs)   CARDIAC CATHETERIZATION  01/26/2011   PCI & stenting of aggresive in-stent restenosis within previously stented AV groove Cfx with 3.0x54mm Taxus DES (previous stents were Promus) (Dr. Adora Fridge)   CARDIAC CATHETERIZATION  05/11/2011   preserved LV function, 40% mid LAD stenosis, 30-40% narrowing proximal to stented semgnet of prox Cfx, patent mid RCA stent with smooth 20% narrowing in distal RCA (Dr. Corky Downs)   CARDIAC CATHETERIZATION  12/15/2011   PCI & stenting of proximal & mid Cfx with DES - 3.0x35mm in proximal, 3.0x51mm in mid (Dr. Adora Fridge)   [place] N/A 06/10/2015   Procedure: Left Heart Cath and Coronary Angiography;  Surgeon: Jolaine Artist, MD;  Location: [place] CV LAB;  Service: Cardiovascular;  Laterality: N/A;   CARDIAC CATHETERIZATION  79/15/0569   cardiometabolic testing  7/94/8016   good exercise effort, peak VO2 79% predicted with normal VO2 HR curves (mild deconditioning)   COLONOSCOPY  12/2012   diminutive hyperplastic sigmoid poyp so repeat routine 2024   CORONARY BALLOON ANGIOPLASTY N/A 10/25/2018   Procedure: CORONARY BALLOON ANGIOPLASTY;  Surgeon: Jettie Booze, MD;  Location: [place] CV LAB;  Service: Cardiovascular;  Laterality: N/A;   CORONARY BALLOON ANGIOPLASTY N/A 09/29/2019   Procedure: CORONARY BALLOON ANGIOPLASTY;  Surgeon: Nelva Bush, MD;  Location: [place] CV LAB;  Service: Cardiovascular;  Laterality: N/A;   EXCISIONAL HEMORRHOIDECTOMY  1984   INTRAVASCULAR ULTRASOUND/IVUS N/A 09/29/2019    Procedure: Intravascular Ultrasound/IVUS;  Surgeon: Nelva Bush, MD;  Location: [place] CV LAB;  Service: Cardiovascular;  Laterality: N/A;   LEFT HEART CATH AND CORONARY ANGIOGRAPHY N/A 10/25/2018   Procedure: LEFT HEART CATH AND CORONARY ANGIOGRAPHY;  Surgeon: Jettie Booze, MD;  Location: [place] CV LAB;  Service: Cardiovascular;  Laterality: N/A;   LEFT HEART CATH AND CORONARY ANGIOGRAPHY N/A 09/29/2019   Procedure: LEFT HEART CATH AND CORONARY ANGIOGRAPHY;  Surgeon: Nelva Bush, MD;  Location: [place] CV LAB;  Service: Cardiovascular;  Laterality: N/A;   LEFT HEART CATHETERIZATION WITH CORONARY ANGIOGRAM N/A 05/11/2011   Procedure: LEFT HEART CATHETERIZATION WITH CORONARY ANGIOGRAM;  Surgeon: Troy Sine, MD;  Location[facility] CATH LAB;  Service: Cardiovascular;  Laterality: N/A;  Possible percutaneous coronary intervention, possible IVUS   LEFT HEART CATHETERIZATION WITH CORONARY ANGIOGRAM N/A 12/15/2011   Procedure: LEFT HEART CATHETERIZATION WITH CORONARY ANGIOGRAM;  Surgeon: Lorretta Harp, MD;  Location: [facility] CATH LAB;  Service: Cardiovascular;  Laterality: N/A;   LEFT HEART CATHETERIZATION WITH CORONARY ANGIOGRAM N/A 09/05/2014   Procedure: LEFT HEART CATHETERIZATION WITH CORONARY ANGIOGRAM;  Surgeon: Burnell Blanks, MD;  Location: [facility] CATH LAB;  Service: Cardiovascular;  Laterality: N/A;   LIPOMA EXCISION     back of the head   NM MYOCAR PERF WALL MOTION  02/2012   lexiscan myoview; mild perfusion defect in mid inferolateral & basal inferolateral region (infarct/scar); EF 52%, abnormal but ow risk scan   PERCUTANEOUS CORONARY STENT INTERVENTION (PCI-S)  09/05/2014   Procedure: PERCUTANEOUS CORONARY STENT INTERVENTION (PCI-S);  Surgeon: Burnell Blanks, MD;  Location: [facility] CATH LAB;  Service: Cardiovascular;;   TONSILLECTOMY      FAMHx:  Family  History  Problem Relation Age of Onset   Leukemia Mother    Prostate cancer Father    Cancer Brother     Coronary artery disease Paternal Grandmother    Cancer Paternal Grandfather    Migraines Neg Hx    Headache Neg Hx     SOCHx:   reports that he quit smoking about 3 years ago. His smoking use included cigarettes. He has a 10.00 pack-year smoking history. He has never used smokeless tobacco. He reports that he does not currently use alcohol. He reports that he does not currently use drugs after having used the following drugs: Cocaine.  ALLERGIES:  Allergies  Allergen Reactions   Bee Venom Anaphylaxis and Hives   Gadolinium Hives, Itching, Swelling and Other (See Comments)    Swelling of eyes after receiving MR contrast. 13 hr prep recommended. Pt developed large hive and swelling under right eye after contrast, given $RemoveBeforeD'50mg'ORomNHYQbfbSWN$  iv benadryl, pt will need premed before gadolinium//lh   Shellfish Allergy Anaphylaxis and Hives   Statins Other (See Comments)    Myalgias. Tolerating livalo. Pain    Testosterone Cypionate Other (See Comments)    Testerone Injection --Increased breast tissue     ROS: Pertinent items noted in HPI and remainder of comprehensive ROS otherwise negative.  HOME MEDS: Current Outpatient Medications  Medication Sig Dispense Refill   Accu-Chek Softclix Lancets lancets SMARTSIG:1 Topical 4 Times Daily     amLODipine (NORVASC) 10 MG tablet Take 1 tablet by mouth once daily 90 tablet 1   aspirin 81 MG EC tablet Take 1 tablet (81 mg total) by mouth daily. (May buy from over the counter): Swallow whole for heart health 30 tablet 0   blood glucose meter kit and supplies KIT Dispense based on patient and insurance preference. Use up to four times daily as directed. (FOR ICD-9 250.00, 250.01). 1 each 0   Blood Pressure Monitor KIT 1 kit by Does not apply route daily. 1 kit 0   clopidogrel (PLAVIX) 75 MG tablet Take 1 tablet by mouth once daily 90 tablet 1   EPINEPHrine 0.3 mg/0.3 mL IJ SOAJ injection Inject 0.3 mg into the muscle as needed for anaphylaxis. 1 each 2   glucose  blood (ACCU-CHEK GUIDE) test strip Use as instructed 300 each 3   hydrOXYzine (ATARAX) 25 MG tablet Take 1 tablet (25 mg total) by mouth 3 (three) times daily as needed for anxiety. 90 tablet 3   metFORMIN (GLUCOPHAGE) 500 MG tablet Take 1 tablet (500 mg total) by mouth 2 (two) times daily with a meal. 60 tablet 0   metoprolol tartrate (LOPRESSOR) 50 MG tablet Take 2 tablets (100 mg total) by mouth 2 (two) times daily. (Patient taking differently: Take 100 mg by mouth 2 (two) times daily. TOTAL 200 MG) 90 tablet 0   mupirocin ointment (BACTROBAN) 2 % Apply 1 application. topically 2 (two) times daily. 22 g 0   nitroGLYCERIN (NITROSTAT) 0.4 MG SL tablet Place 1 tablet (0.4 mg total) under the tongue every 5 (five) minutes x 3 doses as needed for chest pain. 25 tablet 3   Omega-3 Fatty Acids (FISH OIL PO) Take 1 capsule by mouth daily.     SF 5000 PLUS 1.1 % CREA dental cream Place 1 application onto teeth at bedtime as needed (For teeth sensitivity).     sildenafil (VIAGRA) 100 MG tablet Take 1 tablet (100 mg total) by mouth daily as needed. (Patient taking differently: Take 100 mg by mouth daily as  needed for erectile dysfunction.) 10 tablet 5   Testosterone (ANDROGEL PUMP) 20.25 MG/ACT (1.62%) GEL Place 1 Pump onto the skin daily. 75 g 2   ezetimibe (ZETIA) 10 MG tablet Take 1 tablet (10 mg total) by mouth daily. 90 tablet 3   LIVALO 4 MG TABS Take 1 tablet (4 mg total) by mouth daily. (Patient taking differently: Take 4 mg by mouth daily.) 90 tablet 3   No current facility-administered medications for this visit.    LABS/IMAGING: No results found for this or any previous visit (from the past 48 hour(s)). No results found.  VITALS: BP 122/75   Pulse (!) 59   Ht $R'5\' 8"'nT$  (1.727 m)   Wt 189 lb 3.2 oz (85.8 kg)   SpO2 99%   BMI 28.77 kg/m   EXAM: General appearance: alert and no distress Neck: no carotid bruit and no JVD Lungs: clear to auscultation bilaterally Heart: regular rate and  rhythm, S1, S2 normal, no murmur, click, rub or gallop Abdomen: soft, non-tender; bowel sounds normal; no masses,  no organomegaly Extremities: extremities normal, atraumatic, no cyanosis or edema Pulses: 2+ and symmetric Skin: Skin color, texture, turgor normal. No rashes or lesions Neurologic: Grossly normal Psych: Pleasant  EKG: Sinus bradycardia at 59-personally reviewed  ASSESSMENT: Recurrent chest pain -PCI for in-stent restenosis to the circumflex/OM 2 (09/2019) Recent unstable angina status post PTCA to the circumflex and marginal branches for in-stent restenosis Coronary artery disease status post multiple PCI as with the last stent to the circumflex in 2013 Dyslipidemia-on Livalo (intolerant to atorvastatin, simvastatin and rosuvastatin) Hypertension Diabetes type 2 - with peripheral neuropathy Erectile dysfunction Alcohol dependence Abuse  PLAN: 1.   [name] has once again started using alcohol.  He notes this is becoming an issue any smoking with it.  When he smokes he says he gets chest pain which sounds pleuritic.  I advised him to see if he could cut back on that.  He is interested in inpatient rehabilitation for alcohol as he has failed outpatient in the past.  There are 2 programs he is looking into.  He would need a referral.  We will provide a referral letter for him and can place a referral if necessary.  I think this is important for him as far as reducing his risk.  We will have him discuss options with our Education officer, museum.  I would also advise he remain on the 100 mg twice daily Toprol.  We will provide the 100 mg tablets which she can take 1 twice daily  Plan follow-up with me in 6 months or sooner as necessary.  Pixie Casino, MD, [facility], [place] Director of the Advanced Lipid Disorders &  Cardiovascular Risk Reduction Clinic Diplomate of the American Board of Clinical Lipidology Attending Cardiologist  Direct Dial:  [phone]  Fax: [phone]  Website:  [email or web address] 12/10/2021, 8:16 AM

## 2021-12-10 NOTE — Telephone Encounter (Signed)
Pt returned my call- indicated he received my message, was going into group and would try and reach me after 2pm today or tomorrow morning first thing. I texted pt and let him know I received his message and was available at either time.   Westley Hummer, MSW, [place]  [phone]- work cell phone (preferred) [phone]- desk phone

## 2021-12-10 NOTE — Telephone Encounter (Signed)
LCSW received referral for inpatient ETOH cessation options.  Attempted to reach pt at [phone], no answer, voicemail left requesting call back.  Will re-attempt as able.   Westley Hummer, MSW, [place]  [phone]- work cell phone (preferred) [phone]- desk phone

## 2021-12-11 ENCOUNTER — Telehealth: Payer: Self-pay | Admitting: Licensed Clinical Social Worker

## 2021-12-11 NOTE — Telephone Encounter (Signed)
LCSW attempted pt again this morning- he did not call yesterday and mentioned first thing in the morning. No answer at [phone]. Left additional voicemail.   Westley Hummer, MSW, [place]  [phone]- work cell phone (preferred) [phone]- desk phone

## 2021-12-11 NOTE — Progress Notes (Signed)
Heart and Vascular Care Navigation  12/11/2021  [name] 07-30-1960 [id number]  Reason for Referral:  Patient is participating in a Managed Medicaid Plan: No- Medicaid [place] Access ETOH Cessation Engaged with patient by telephone for initial visit for Heart and Vascular Care Coordination.                                                                                                   Assessment:      LCSW was able to touch base with pt this afternoon. Introduced self, role, reason for call. Pt confirmed home address, PCP, and emergency contact (sister in [place]). Denies any cost of living concerns at this time- he receives disability. Able to access and afford medications. He shares that his biggest concern is that he binges ETOH and has had multiple periods of relapse with his longest being 4 yrs and 1 mo of sobriety. He has utilized multiple outpatient resources- he currently goes to groups at [facility] but finds it hard to get sober when returning home. He has had previous inpatient stays at [facility] and [facility]. He attributes [facility] to his sobriety for the long period. Pt interested in inpatient placement for support. We discussed sometimes options are limited for Medicaid only coverage. He has contacted [facility] but since they are currently moving he will not be able to self refer until they have completed that. We discussed other options, pt interested in me calling to assess eligibility. I will do so and let pt know by the end of the day what I hear. Pt agreeable.                                   HRT/VAS Care Coordination     Patients Home Cardiology Office [place] Team Social Worker   Social Worker Name: Westley Hummer, LCSW, [facility]   Living arrangements for the past 2 months Apartment   Lives with: Self   Patient Current Insurance Coverage Medicaid   Patient Has Concern With Paying Medical Bills No   Does Patient Have  Prescription Coverage? Yes   Home Assistive Devices/Equipment None   DME Agency NA   HH Agency NA       Social History:                                                                             SDOH Screenings   Alcohol Screen: Medium Risk (08/27/2020)   Alcohol Screen    Last Alcohol Screening Score (AUDIT): 27  Depression (PHQ2-9): Low Risk  (11/19/2021)   Depression (PHQ2-9)    PHQ-2 Score: 2  Financial Resource Strain: Low Risk  (12/11/2021)   Overall Financial Resource  Strain (CARDIA)    Difficulty of Paying Living Expenses: Not very hard  Food Insecurity: Not on file  Housing: Low Risk  (12/11/2021)   Housing    Last Housing Risk Score: 0  Physical Activity: Not on file  Social Connections: Not on file  Stress: Not on file  Tobacco Use: Medium Risk (12/10/2021)   Patient History    Smoking Tobacco Use: Former    Smokeless Tobacco Use: Never    Passive Exposure: Not on file  Transportation Needs: No Transportation Needs (12/11/2021)   PRAPARE - Hydrologist (Medical): No    Lack of Transportation (Non-Medical): No    SDOH Interventions: Financial Resources:  Sales promotion account executive Interventions: Intervention Not Indicated  Food Insecurity:   Food Insecurity: Intervention Not Indicated  Housing Insecurity:  Housing Interventions: Intervention Not Indicated  Transportation:   Transportation Interventions: Intervention Not Indicated     Other Care Navigation Interventions:     Inpatient/Outpatient Substance Abuse Counseling/Rehab Options Contacted [facility], [facility], [facility], [facility], [facility], [facility], [facility], and [facility]  Provided Pharmacy assistance resources  Pt denied any concerns at this time  Patient expressed Mental Health concerns Yes, Referred to:  Pt managed by [facility] for medication mgmt and  [facility] for Outpatient Counseling Support    Follow-up plan:   The result of my calls is as follows: - [facility]): no current availability, possibly in mid July, pt should call over the next few weeks - [facility] ([facility]): no current availability, possibly pt could discuss admission with [facility], transitional housing program only - [facility] ([facility]): moving in process - [facility]): no current availability - [facility]): no answer, emailed admissions - [facility]): no answer, left message - [facility]: no answer, unable to leave message, just rings - [facility]: doesn't accept Medicaid for residential treatment, only IOP  I will let pt know

## 2021-12-11 NOTE — Telephone Encounter (Signed)
H&V Care Navigation CSW Progress Note  Clinical Social Worker contacted patient by phone to f/u on referrals for substance use. I was able to reach [name] this afternoon at [phone]. Provided him with update of facilities (see full progress note). [name] states understanding no current openings I am aware of; [name] asks me to call [facility].   I called [facility] and they state that [name] needs to medically detox at one of three facilities: [facility] [phone] - [address] [place] [phone] [facility] [phone] Once he has detoxed then a care coordinator at one of those facilities would be able to send referral for placement at [facility]. I will let [name] know that tomorrow.   Patient is participating in a Managed Medicaid Plan:  No - Medicaid [place] Access only  SDOH Screenings   Alcohol Screen: Medium Risk (08/27/2020)   Alcohol Screen    Last Alcohol Screening Score (AUDIT): 27  Depression (PHQ2-9): Low Risk  (11/19/2021)   Depression (PHQ2-9)    PHQ-2 Score: 2  Financial Resource Strain: Low Risk  (12/11/2021)   Overall Financial Resource Strain (CARDIA)    Difficulty of Paying Living Expenses: Not very hard  Food Insecurity: Not on file  Housing: Low Risk  (12/11/2021)   Housing    Last Housing Risk Score: 0  Physical Activity: Not on file  Social Connections: Not on file  Stress: Not on file  Tobacco Use: Medium Risk (12/10/2021)   Patient History    Smoking Tobacco Use: Former    Smokeless Tobacco Use: Never    Passive Exposure: Not on file  Transportation Needs: No Transportation Needs (12/11/2021)   PRAPARE - Transportation    Lack of Transportation (Medical): No    Lack of Transportation (Non-Medical): No       Westley Hummer, MSW, [place]  [phone]- work cell phone (preferred) [phone]- desk phone

## 2021-12-12 ENCOUNTER — Telehealth: Payer: Self-pay | Admitting: Licensed Clinical Social Worker

## 2021-12-12 NOTE — Telephone Encounter (Signed)
H&V Care Navigation CSW Progress Note  Clinical Social Worker contacted patient by phone to f/u on ETOH cessation options. Was able to reach him at [phone]. Shared information received regarding detox from [facility] prior to admission to inpatient cessation. Pt asking if there was a particular one of the three options ([facility], [facility], [place]) I would recommend. Shared that I cannot recommend, all three are approved options per [facility] rep Sharyn Lull I spoke with yesterday. We discussed that pt has worked with providers at [facility] before, therefore they are aware of him and his care. [facility] may also be a good option due to proximity (closest outside of [facility]) and that they are the same team as [facility]. Pt states understanding, he is interested in [facility] detox in [place], unfortunately we arent able to provide a voucher for pt to get to [place] (confirmed w/ supervisor) as [facility] Pester is not a [facility] facility but could to [facility] as needed. Pt states understanding, will try and get a ride to [place], otherwise was encouraged to go to [facility]. Pt given [facility] [place] number [phone], he was reminded that he would need 30 days of all medications and 1 mo refill and was encouraged to let us know if any issues with that. Pt will call and speak with [facility] about specifics this afternoon. I remain available.   Patient is participating in a Managed Medicaid Plan:  No, Medicaid [place] Access  SDOH Screenings   Alcohol Screen: Medium Risk (08/27/2020)   Alcohol Screen    Last Alcohol Screening Score (AUDIT): 27  Depression (PHQ2-9): Low Risk  (11/19/2021)   Depression (PHQ2-9)    PHQ-2 Score: 2  Financial Resource Strain: Low Risk  (12/11/2021)   Overall Financial Resource Strain (CARDIA)    Difficulty of Paying Living Expenses: Not very hard  Food Insecurity: Not on file  Housing: Low Risk  (12/11/2021)   Housing    Last Housing Risk Score: 0  Physical Activity:  Not on file  Social Connections: Not on file  Stress: Not on file  Tobacco Use: Medium Risk (12/10/2021)   Patient History    Smoking Tobacco Use: Former    Smokeless Tobacco Use: Never    Passive Exposure: Not on file  Transportation Needs: No Transportation Needs (12/11/2021)   PRAPARE - Transportation    Lack of Transportation (Medical): No    Lack of Transportation (Non-Medical): No   Westley Hummer, MSW, [place]  [phone]- work cell phone (preferred) [phone]- desk phone

## 2021-12-14 ENCOUNTER — Other Ambulatory Visit: Payer: Self-pay | Admitting: Nurse Practitioner

## 2021-12-14 DIAGNOSIS — E1159 Type 2 diabetes mellitus with other circulatory complications: Secondary | ICD-10-CM

## 2021-12-16 ENCOUNTER — Telehealth: Payer: Self-pay

## 2021-12-16 ENCOUNTER — Telehealth: Payer: Self-pay | Admitting: Licensed Clinical Social Worker

## 2021-12-16 ENCOUNTER — Other Ambulatory Visit: Payer: Self-pay | Admitting: Nurse Practitioner

## 2021-12-16 DIAGNOSIS — E1159 Type 2 diabetes mellitus with other circulatory complications: Secondary | ICD-10-CM

## 2021-12-16 NOTE — Telephone Encounter (Signed)
Metformin 90 day supply refill request

## 2021-12-16 NOTE — Telephone Encounter (Signed)
Request has already been sent to the patients provider for review

## 2021-12-16 NOTE — Telephone Encounter (Signed)
H&V Care Navigation CSW Progress Note  Clinical Social Worker contacted patient by phone to f/u on detox and additional resources given for ETOH cessation. No answer, pt may have entered detox as he had discussed with me on Friday. Message left requesting call back if he has not entered detox or has any additional questions/concerns.  Patient is participating in a Managed Medicaid Plan:  No, Medicaid [place] Access  SDOH Screenings   Alcohol Screen: Medium Risk (08/27/2020)   Alcohol Screen    Last Alcohol Screening Score (AUDIT): 27  Depression (PHQ2-9): Low Risk  (11/19/2021)   Depression (PHQ2-9)    PHQ-2 Score: 2  Financial Resource Strain: Low Risk  (12/11/2021)   Overall Financial Resource Strain (CARDIA)    Difficulty of Paying Living Expenses: Not very hard  Food Insecurity: Not on file  Housing: Low Risk  (12/11/2021)   Housing    Last Housing Risk Score: 0  Physical Activity: Not on file  Social Connections: Not on file  Stress: Not on file  Tobacco Use: Medium Risk (12/10/2021)   Patient History    Smoking Tobacco Use: Former    Smokeless Tobacco Use: Never    Passive Exposure: Not on file  Transportation Needs: No Transportation Needs (12/11/2021)   PRAPARE - Transportation    Lack of Transportation (Medical): No    Lack of Transportation (Non-Medical): No   Westley Hummer, MSW, [place]  [phone]- work cell phone (preferred) [phone]- desk phone

## 2021-12-17 ENCOUNTER — Other Ambulatory Visit: Payer: Self-pay | Admitting: Nurse Practitioner

## 2021-12-17 DIAGNOSIS — E1159 Type 2 diabetes mellitus with other circulatory complications: Secondary | ICD-10-CM

## 2021-12-17 MED ORDER — METFORMIN HCL 500 MG PO TABS: 500.0000 mg | ORAL_TABLET | Freq: Two times a day (BID) | ORAL | 0 refills | Status: DC

## 2022-01-06 ENCOUNTER — Telehealth: Payer: Self-pay

## 2022-01-07 NOTE — Telephone Encounter (Signed)
Error

## 2022-01-08 ENCOUNTER — Ambulatory Visit (INDEPENDENT_AMBULATORY_CARE_PROVIDER_SITE_OTHER): Payer: Medicaid Other | Admitting: Family Medicine

## 2022-01-08 ENCOUNTER — Encounter: Payer: Self-pay | Admitting: Family Medicine

## 2022-01-08 VITALS — BP 148/99 | HR 66 | Temp 98.0°F | Ht 68.0 in | Wt 191.4 lb

## 2022-01-08 DIAGNOSIS — E1159 Type 2 diabetes mellitus with other circulatory complications: Secondary | ICD-10-CM

## 2022-01-08 DIAGNOSIS — R0989 Other specified symptoms and signs involving the circulatory and respiratory systems: Secondary | ICD-10-CM

## 2022-01-08 LAB — POCT GLYCOSYLATED HEMOGLOBIN (HGB A1C)
HbA1c POC (<> result, manual entry): 7.1 % (ref 4.0–5.6)
HbA1c, POC (controlled diabetic range): 7.1 % — AB (ref 0.0–7.0)
HbA1c, POC (prediabetic range): 7.1 % — AB (ref 5.7–6.4)
Hemoglobin A1C: 7.1 % — AB (ref 4.0–5.6)

## 2022-01-08 LAB — GLUCOSE, POCT (MANUAL RESULT ENTRY): POC Glucose: 169 mg/dl — AB (ref 70–99)

## 2022-01-08 MED ORDER — CETIRIZINE HCL 10 MG PO TABS: 10.0000 mg | ORAL_TABLET | Freq: Every day | ORAL | 0 refills | Status: DC

## 2022-01-08 MED ORDER — METFORMIN HCL 1000 MG PO TABS: 1000.0000 mg | ORAL_TABLET | Freq: Two times a day (BID) | ORAL | 3 refills | Status: DC

## 2022-01-08 MED ORDER — FLUTICASONE PROPIONATE 50 MCG/ACT NA SUSP: 2.0000 | Freq: Every day | NASAL | 6 refills | Status: DC

## 2022-01-08 NOTE — Progress Notes (Signed)
Patient [name]    Subjective   Patient ID: [name], male    DOB: 1960/12/20  Age: 61 y.o. MRN: [id number]  Chief Complaint  Patient presents with   Follow-up    Pt states he is here because he is trying to get in to a 28 day rehab and they wont accept him because his blood sugar is to high. Pt wants to discuss how can he get his sugar under control so that he can go into the program. Pt also stated he has a cough for about a week he has not taken any OTC medication   [name] is a 61 year old male with a medical history significant for alcohol dependence and mood disorder, CAD, essential hypertension, generalized anxiety and type 2 diabetes mellitus presents for a follow-up of his chronic conditions.  Patient states that glucose has been elevated above his baseline over the past 2 weeks.  Patient has had glucose evaluated by intake RN at rehabilitation facility.  Patient's blood sugars been well above 200.  He states that he has not had any changes to his diet and has been without alcohol over the past several weeks.  Patient has been taking metformin 500 mg twice daily.  He denies any polyuria, polydipsia, or polyphagia. Patient is also complaining of some respiratory symptoms over the past several days.  He has not had any sick contacts.  Patient has not attempted any over-the-counter interventions.  He endorses cough, sneezing, and runny nose.   Diabetes He presents for his follow-up diabetic visit. He has type 2 diabetes mellitus. His disease course has been stable. Pertinent negatives for diabetes include no chest pain, no fatigue, no foot paresthesias, no polydipsia, no polyphagia, no polyuria, no weakness and no weight loss. Symptoms are stable. There are no diabetic complications. He has not had a previous visit with a dietitian. He does not see a podiatrist.Eye exam is not current.    Patient Active Problem List   Diagnosis Date  Noted   Suicidal ideation 07/07/2021   Generalized anxiety disorder 07/07/2021   Other allergic rhinitis 09/09/2020   Adverse reaction to food, subsequent encounter 09/09/2020   History of bee sting allergy 09/09/2020   Alcohol dependence with alcohol-induced mood disorder ([place]) 03/19/2020   MDD (major depressive disorder), recurrent episode, severe ([place]) 03/18/2020   History of substance abuse ([place]) 01/04/2020   Severe 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 features ([place]) 12/10/2019   Tinea cruris 07/06/2019   Hemoglobin A1c less than 7.0% 07/06/2019   Muscle strain of wrist 04/05/2019   Pain in both wrists 04/05/2019   Anxiety 04/05/2019   Prediabetes 04/05/2019   MDD (major depressive disorder) 01/12/2019   Chest pain 10/25/2018   MDD (major depressive disorder), recurrent severe, without psychosis ([place]) 08/15/2018   Influenza A 07/17/2017   Lactic acidosis    Cough    Nausea and vomiting    Severe episode of recurrent major depressive disorder, without psychotic features ([place])    MDD (major depressive disorder), single episode, severe with psychotic features ([place]) 06/27/2017   MDD (major depressive disorder), severe ([place]) 06/23/2017   Essential hypertension 01/13/2016   Unstable angina ([name])    ED (erectile dysfunction) 02/15/2014   Atypical chest pain 12/14/2011   Allergy to contrast media (used for diagnostic x-rays) 05/12/2011   Dyslipidemia, goal LDL below 70 05/09/2011   DM2 (diabetes mellitus, type 2) ([place]) 07/10/2010   CAD S/P percutaneous coronary  angioplasty 07/10/2010   Past Medical History:  Diagnosis Date   Alcohol dependence ([name])    Allergies    Arthritis    Chest pain    Chronic lower back pain    Chronic pain of right wrist    Coronary artery disease    a. Multiple prior caths/PCI. Cath 2013 with possible spasm of RCA, 70% ISR of mid LCx with subsequent DES to mLCx and prox LCX. b. H/o microvascular angina. c. Recurrent angina 08/2014 - s/p  PTCA/DES to prox Cx, PTCA/CBA to OM1.  c. LHC 06/10/15 with patent stents and some ISR in LCX and OM-1 that was not flow limiting --> Rx    Dyslipidemia    a. Intolerant to many statins except tolerating Livalo.   GERD (gastroesophageal reflux disease)    H/O cardiac catheterization 10/25/2018   Heart attack [facility])    Hypertension    Myocardial infarction [facility]) ~ 2010   S/P angioplasty with stent, DES, to proximal and mid LCX 12/15/11 12/15/2011   S/P foot surgery, right 04/2021   Shoulder pain    Stroke ([place])    pt. reports had a stroke around time of MI 2010   Type II diabetes mellitus ([place])    Unstable angina [facility])    Past Surgical History:  Procedure Laterality Date   CARDIAC CATHETERIZATION  06/15/2002   LAD with prox 40% stenosis, norma L main, Cfx with 25% lesion, RCA with long mid 25% stenosis (Dr. Vita Barley)   CARDIAC CATHETERIZATION  04/01/2010   normal L main, LAD wit mild stenosis, L Cfx with 70% in-stent restenosis, RCA with 70% in-stent restenosis, LVEF >60% (Dr. K. Mali Hilty) - cutting ballon arthrectomy to RCA & Cfx (Dr. Rockne Menghini)   CARDIAC CATHETERIZATION  08/25/2010   preserved global LV contractility; multivessel CAD, diffuse 90-95% in-stent restenosis in prox placed Cfx stent - cutting balloon arthrectomy in Cfx with multiple dilatations 90-95% to 0% stenosis (Dr. Corky Downs)   CARDIAC CATHETERIZATION  01/26/2011   PCI & stenting of aggresive in-stent restenosis within previously stented AV groove Cfx with 3.0x7m Taxus DES (previous stents were Promus) (Dr. JAdora Fridge   CARDIAC CATHETERIZATION  05/11/2011   preserved LV function, 40% mid LAD stenosis, 30-40% narrowing proximal to stented semgnet of prox Cfx, patent mid RCA stent with smooth 20% narrowing in distal RCA (Dr. TCorky Downs   CARDIAC CATHETERIZATION  12/15/2011   PCI & stenting of proximal & mid Cfx with DES - 3.0x147min proximal, 3.0x1547mn mid (Dr. J. Adora Fridge [place] 06/10/2015    Procedure: Left Heart Cath and Coronary Angiography;  Surgeon: DanJolaine ArtistD;  Location: [facility];  Service: Cardiovascular;  Laterality: N/A;   CARDIAC CATHETERIZATION  04/44/03/4742cardiometabolic testing  08/13/93/6387good exercise effort, peak VO2 79% predicted with normal VO2 HR curves (mild deconditioning)   COLONOSCOPY  12/2012   diminutive hyperplastic sigmoid poyp so repeat routine 2024   CORONARY BALLOON ANGIOPLASTY N/A 10/25/2018   Procedure: CORONARY BALLOON ANGIOPLASTY;  Surgeon: VarJettie BoozeD;  Location: [facility];  Service: Cardiovascular;  Laterality: N/A;   CORONARY BALLOON ANGIOPLASTY N/A 09/29/2019   Procedure: CORONARY BALLOON ANGIOPLASTY;  Surgeon: EndNelva BushD;  Location: [facility];  Service: Cardiovascular;  Laterality: N/A;   EXCISIONAL HEMORRHOIDECTOMY  1984   INTRAVASCULAR ULTRASOUND/IVUS N/A 09/29/2019   Procedure: Intravascular Ultrasound/IVUS;  Surgeon: EndNelva BushD;  Location: [facility]  [facility];  Service: Cardiovascular;  Laterality: N/A;   LEFT HEART CATH AND CORONARY ANGIOGRAPHY N/A 10/25/2018   Procedure: LEFT HEART CATH AND CORONARY ANGIOGRAPHY;  Surgeon: Jettie Booze, MD;  Location: [facility] LAB;  Service: Cardiovascular;  Laterality: N/A;   LEFT HEART CATH AND CORONARY ANGIOGRAPHY N/A 09/29/2019   Procedure: LEFT HEART CATH AND CORONARY ANGIOGRAPHY;  Surgeon: Nelva Bush, MD;  Location: [place] [facility];  Service: Cardiovascular;  Laterality: N/A;   LEFT HEART CATHETERIZATION WITH CORONARY ANGIOGRAM N/A 05/11/2011   Procedure: LEFT HEART CATHETERIZATION WITH CORONARY ANGIOGRAM;  Surgeon: Troy Sine, MD;  Location[facility] CATH LAB;  Service: Cardiovascular;  Laterality: N/A;  Possible percutaneous coronary intervention, possible IVUS   LEFT HEART CATHETERIZATION WITH CORONARY ANGIOGRAM N/A 12/15/2011   Procedure: LEFT HEART CATHETERIZATION WITH CORONARY ANGIOGRAM;  Surgeon: Lorretta Harp, MD;  Location: [facility] CATH LAB;  Service: Cardiovascular;  Laterality: N/A;   LEFT HEART CATHETERIZATION WITH CORONARY ANGIOGRAM N/A 09/05/2014   Procedure: LEFT HEART CATHETERIZATION WITH CORONARY ANGIOGRAM;  Surgeon: Burnell Blanks, MD;  Location: [facility] CATH LAB;  Service: Cardiovascular;  Laterality: N/A;   LIPOMA EXCISION     back of the head   NM MYOCAR PERF WALL MOTION  02/2012   lexiscan myoview; mild perfusion defect in mid inferolateral & basal inferolateral region (infarct/scar); EF 52%, abnormal but ow risk scan   PERCUTANEOUS CORONARY STENT INTERVENTION (PCI-S)  09/05/2014   Procedure: PERCUTANEOUS CORONARY STENT INTERVENTION (PCI-S);  Surgeon: Burnell Blanks, MD;  Location: [facility] CATH LAB;  Service: Cardiovascular;;   TONSILLECTOMY     Social History   Tobacco Use   Smoking status: Former    Packs/day: 1.00    Years: 10.00    Total pack years: 10.00    Types: Cigarettes    Quit date: 10/07/2018    Years since quitting: 3.2   Smokeless tobacco: Never  Vaping Use   Vaping Use: Never used  Substance Use Topics   Alcohol use: Yes    Comment: 2 bottles of wine on 12/08/2019 (one occurrence)   Drug use: Not Currently    Types: Cocaine    Comment: last use several months ago   Social History   Socioeconomic History   Marital status: Widowed    Spouse name: Not on file   Number of children: 2   Years of education: GED   Highest education level: Not on file  Occupational History   Not on file  Tobacco Use   Smoking status: Former    Packs/day: 1.00    Years: 10.00    Total pack years: 10.00    Types: Cigarettes    Quit date: 10/07/2018    Years since quitting: 3.2   Smokeless tobacco: Never  Vaping Use   Vaping Use: Never used  Substance and Sexual Activity   Alcohol use: Yes    Comment: 2 bottles of wine on 12/08/2019 (one occurrence)   Drug use: Not Currently    Types: Cocaine    Comment: last use several months ago   Sexual activity: Not  Currently  Other Topics Concern   Not on file  Social History Narrative   ** Merged History Encounter **    Pt states he only smoke when he drinks   Social Determinants of Health   Financial Resource Strain: Low Risk  (12/11/2021)   Overall Financial Resource Strain (CARDIA)    Difficulty of Paying Living Expenses: Not very hard  Food Insecurity:  Not on file  Transportation Needs: No Transportation Needs (12/11/2021)   PRAPARE - Hydrologist (Medical): No    Lack of Transportation (Non-Medical): No  Physical Activity: Not on file  Stress: Not on file  Social Connections: Not on file  Intimate Partner Violence: Not on file   Family Status  Relation Name Status   Mother  Deceased   Father  Deceased at age 5   Brother  (Not Specified)   PGM  Deceased   PGF  (Not Specified)   Neg Hx  (Not Specified)   Family History  Problem Relation Age of Onset   Leukemia Mother    Prostate cancer Father    Cancer Brother    Coronary artery disease Paternal Grandmother    Cancer Paternal Grandfather    Migraines Neg Hx    Headache Neg Hx    Allergies  Allergen Reactions   Bee Venom Anaphylaxis and Hives   Gadolinium Hives, Itching, Swelling and Other (See Comments)    Swelling of eyes after receiving MR contrast. 13 hr prep recommended. Pt developed large hive and swelling under right eye after contrast, given 74m iv benadryl, pt will need premed before gadolinium//lh   Shellfish Allergy Anaphylaxis and Hives   Statins Other (See Comments)    Myalgias. Tolerating livalo. Pain    Testosterone Cypionate Other (See Comments)    Testerone Injection --Increased breast tissue       Review of Systems  Constitutional:  Negative for fatigue and weight loss.  HENT: Negative.    Eyes: Negative.   Respiratory: Negative.    Cardiovascular: Negative.  Negative for chest pain.  Genitourinary: Negative.   Musculoskeletal: Negative.   Skin: Negative.    Neurological:  Negative for weakness.  Endo/Heme/Allergies:  Negative for polydipsia and polyphagia.  Psychiatric/Behavioral: Negative.        Objective:     BP (!) 148/99 (BP Location: Left Arm, Patient Position: Sitting, Cuff Size: Normal)   Pulse 66   Temp 98 F (36.7 C)   Ht 5' 8"  (1.727 m)   Wt 191 lb 6.4 oz (86.8 kg)   SpO2 98%   BMI 29.10 kg/m  BP Readings from Last 3 Encounters:  01/08/22 (!) 148/99  12/10/21 122/75  12/04/21 117/80   Wt Readings from Last 3 Encounters:  01/08/22 191 lb 6.4 oz (86.8 kg)  12/10/21 189 lb 3.2 oz (85.8 kg)  12/04/21 188 lb 12.8 oz (85.6 kg)      Physical Exam   Results for orders placed or performed in visit on 01/08/22  POCT glycosylated hemoglobin (Hb A1C)  Result Value Ref Range   Hemoglobin A1C 7.1 (A) 4.0 - 5.6 %   HbA1c POC (<> result, manual entry) 7.1 4.0 - 5.6 %   HbA1c, POC (prediabetic range) 7.1 (A) 5.7 - 6.4 %   HbA1c, POC (controlled diabetic range) 7.1 (A) 0.0 - 7.0 %  POCT glucose (manual entry)  Result Value Ref Range   POC Glucose 169 (A) 70 - 99 mg/dl    Last CBC Lab Results  Component Value Date   WBC 5.0 12/04/2021   HGB 14.3 12/04/2021   HCT 43.0 12/04/2021   MCV 85 12/04/2021   MCH 28.1 12/04/2021   RDW 12.8 12/04/2021   PLT 240 040/98/1191  Last metabolic panel Lab Results  Component Value Date   GLUCOSE 157 (H) 12/04/2021   NA 140 12/04/2021   K 4.5 12/04/2021   CL  105 12/04/2021   CO2 22 12/04/2021   BUN 14 12/04/2021   CREATININE 1.03 12/04/2021   EGFR 83 12/04/2021   CALCIUM 9.4 12/04/2021   PROT 7.0 12/04/2021   ALBUMIN 4.7 12/04/2021   LABGLOB 2.3 12/04/2021   AGRATIO 2.0 12/04/2021   BILITOT 0.3 12/04/2021   ALKPHOS 74 12/04/2021   AST 22 12/04/2021   ALT 53 (H) 12/04/2021   ANIONGAP 8 07/07/2021   Last lipids Lab Results  Component Value Date   CHOL 133 12/05/2021   HDL 43 12/05/2021   LDLCALC 67 12/05/2021   TRIG 128 12/05/2021   CHOLHDL 3.1 12/05/2021    Last hemoglobin A1c Lab Results  Component Value Date   HGBA1C 7.1 (A) 01/08/2022   HGBA1C 7.1 01/08/2022   HGBA1C 7.1 (A) 01/08/2022   HGBA1C 7.1 (A) 01/08/2022   Last thyroid functions Lab Results  Component Value Date   TSH 3.372 07/07/2021   T4TOTAL 5.6 03/19/2020   Last vitamin D Lab Results  Component Value Date   VD25OH 44.9 01/21/2021   Last vitamin B12 and Folate Lab Results  Component Value Date   RTMYTRZN35 670 01/21/2021   FOLATE 14.0 05/11/2013      The 10-year ASCVD risk score (Arnett DK, et al., 2019) is: 28.5%    Assessment & Plan:   Problem List Items Addressed This Visit       Endocrine   DM2 (diabetes mellitus, type 2) ([place]) - Primary (Chronic)   Relevant Orders   POCT glycosylated hemoglobin (Hb A1C) (Completed)   POCT glucose (manual entry) (Completed)    1. Type 2 diabetes mellitus with other circulatory complication, without long-term current use of insulin ([name]) Today, patient's hemoglobin A1c is 7.1, which is similar to previous.  We will increase his metformin to 1000 mg twice daily.  He will follow-up with PCP in 3 months. - POCT glycosylated hemoglobin (Hb A1C) - POCT glucose (manual entry) - metFORMIN (GLUCOPHAGE) 1000 MG tablet; Take 1 tablet (1,000 mg total) by mouth 2 (two) times daily with a meal.  Dispense: 180 tablet; Refill: 3  2. Upper respiratory symptom  - fluticasone (FLONASE) 50 MCG/ACT nasal spray; Place 2 sprays into both nostrils daily.  Dispense: 16 g; Refill: 6 - cetirizine (ZYRTEC) 10 MG tablet; Take 1 tablet (10 mg total) by mouth daily.  Dispense: 30 tablet; Refill: 0   Donia Pounds  APRN, MSN, FNP-C Patient [name] [address] [place], [place] [address] [phone]

## 2022-01-08 NOTE — Patient Instructions (Signed)
Cetirizine Capsules What is this medication? CETIRIZINE (se TI ra zeen) prevents and treats allergy symptoms, such as red, itchy eyes, sneezing, a runny or stuffy nose, or hives. It works by blocking histamine, a substance released by the body during an allergic reaction. It belongs to a group of medications called antihistamines. This medicine may be used for other purposes; ask your health care provider or pharmacist if you have questions. COMMON BRAND NAME(S): Zyrtec Liquid Gel What should I tell my care team before I take this medication? They need to know if you have any of these conditions: Kidney disease Liver disease An unusual or allergic reaction to cetirizine, hydroxyzine, other medications, foods, dyes, or preservatives Pregnant or trying to get pregnant Breast-feeding How should I use this medication? Take this medication by mouth with a glass of water. Follow the directions on the prescription label. You can take this medication with food or on an empty stomach. Do not cut, crush or chew this medication. Take your medication at regular times. Do not take more often than directed. You may need to take this medication for several days before your symptoms improve. Talk to your care team about the use of this medication in children. Special care may be needed. While this medication may be prescribed for children as young as 68 years of age for selected conditions, precautions do apply. Overdosage: If you think you have taken too much of this medicine contact a poison control center or emergency room at once. NOTE: This medicine is only for you. Do not share this medicine with others. What if I miss a dose? If you miss a dose, take it as soon as you can. If it is almost time for your next dose, take only that dose. Do not take double or extra doses. What may interact with this medication? Alcohol Certain medications for anxiety or sleep Narcotic medications for pain Other medications  for colds or allergies This list may not describe all possible interactions. Give your health care provider a list of all the medicines, herbs, non-prescription drugs, or dietary supplements you use. Also tell them if you smoke, drink alcohol, or use illegal drugs. Some items may interact with your medicine. What should I watch for while using this medication? Visit your care team for regular checks on your health. Tell your care team if your symptoms do not improve. You may get drowsy or dizzy. Do not drive, use machinery, or do anything that needs mental alertness until you know how this medication affects you. Do not stand or sit up quickly, especially if you are an older patient. This reduces the risk of dizzy or fainting spells. Your mouth may get dry. Chewing sugarless gum or sucking hard candy, and drinking plenty of water may help. Contact your care team if the problem does not go away or is severe. What side effects may I notice from receiving this medication? Side effects that you should report to your care team as soon as possible: Allergic reactions--skin rash, itching, hives, swelling of the face, lips, tongue, or throat Side effects that usually do not require medical attention (report to your care team if they continue or are bothersome): Dizziness Drowsiness Dry mouth Fatigue This list may not describe all possible side effects. Call your doctor for medical advice about side effects. You may report side effects to FDA at 1-800-FDA-1088. Where should I keep my medication? Keep out of the reach of children. Store at room temperature between 20 and 25  degrees C (68 and 77 degrees F). Throw away any unused medication after the expiration date. NOTE: This sheet is a summary. It may not cover all possible information. If you have questions about this medicine, talk to your doctor, pharmacist, or health care provider.  2023 Elsevier/Gold Standard (2020-09-09 00:00:00)

## 2022-01-17 ENCOUNTER — Other Ambulatory Visit: Payer: Self-pay | Admitting: Internal Medicine

## 2022-01-19 ENCOUNTER — Telehealth: Payer: Self-pay | Admitting: Nurse Practitioner

## 2022-01-19 NOTE — Telephone Encounter (Signed)
[place] ° ° °

## 2022-01-20 ENCOUNTER — Ambulatory Visit (HOSPITAL_COMMUNITY)
Admission: EM | Admit: 2022-01-20 | Discharge: 2022-01-21 | Disposition: A | Discharge status: Home / Self Care | Payer: Medicaid Other | Attending: Psychiatry | Admitting: Psychiatry

## 2022-01-20 DIAGNOSIS — F17210 Nicotine dependence, cigarettes, uncomplicated: Secondary | ICD-10-CM | POA: Insufficient documentation

## 2022-01-20 DIAGNOSIS — Z20822 Contact with and (suspected) exposure to covid-19: Secondary | ICD-10-CM | POA: Insufficient documentation

## 2022-01-20 DIAGNOSIS — Z7982 Long term (current) use of aspirin: Secondary | ICD-10-CM | POA: Insufficient documentation

## 2022-01-20 DIAGNOSIS — Z8659 Personal history of other mental and behavioral disorders: Secondary | ICD-10-CM | POA: Insufficient documentation

## 2022-01-20 DIAGNOSIS — E119 Type 2 diabetes mellitus without complications: Secondary | ICD-10-CM | POA: Insufficient documentation

## 2022-01-20 DIAGNOSIS — Z79899 Other long term (current) drug therapy: Secondary | ICD-10-CM | POA: Insufficient documentation

## 2022-01-20 DIAGNOSIS — Z7984 Long term (current) use of oral hypoglycemic drugs: Secondary | ICD-10-CM | POA: Insufficient documentation

## 2022-01-20 DIAGNOSIS — M542 Cervicalgia: Secondary | ICD-10-CM | POA: Insufficient documentation

## 2022-01-20 DIAGNOSIS — I10 Essential (primary) hypertension: Secondary | ICD-10-CM | POA: Insufficient documentation

## 2022-01-20 DIAGNOSIS — Z9151 Personal history of suicidal behavior: Secondary | ICD-10-CM | POA: Insufficient documentation

## 2022-01-20 DIAGNOSIS — Z7902 Long term (current) use of antithrombotics/antiplatelets: Secondary | ICD-10-CM | POA: Insufficient documentation

## 2022-01-20 DIAGNOSIS — E785 Hyperlipidemia, unspecified: Secondary | ICD-10-CM | POA: Insufficient documentation

## 2022-01-20 DIAGNOSIS — F411 Generalized anxiety disorder: Secondary | ICD-10-CM | POA: Insufficient documentation

## 2022-01-20 DIAGNOSIS — F1020 Alcohol dependence, uncomplicated: Secondary | ICD-10-CM | POA: Insufficient documentation

## 2022-01-20 LAB — ETHANOL: Alcohol, Ethyl (B): <10 mg/dL (ref <10)

## 2022-01-20 LAB — COMPREHENSIVE METABOLIC PANEL
ALT: 35 U/L (ref 0–44)
AST: 22 U/L (ref 15–41)
Albumin: 4 g/dL (ref 3.5–5.0)
Alkaline Phosphatase: 69 U/L (ref 38–126)
Anion gap: 9 (ref 5–15)
BUN: 15 mg/dL (ref 6–20)
CO2: 23 mmol/L (ref 22–32)
Calcium: 9.3 mg/dL (ref 8.9–10.3)
Chloride: 107 mmol/L (ref 98–111)
Creatinine, Ser: 0.93 mg/dL (ref 0.61–1.24)
GFR, Estimated: >60 mL/min (ref >60)
Glucose, Bld: 102 mg/dL — ABNORMAL HIGH (ref 70–99)
Potassium: 4.2 mmol/L (ref 3.5–5.1)
Sodium: 139 mmol/L (ref 135–145)
Total Bilirubin: 0.2 mg/dL — ABNORMAL LOW (ref 0.3–1.2)
Total Protein: 6.2 g/dL — ABNORMAL LOW (ref 6.5–8.1)

## 2022-01-20 LAB — LIPID PANEL
Cholesterol: 122 mg/dL (ref 0–200)
HDL: 36 mg/dL — ABNORMAL LOW (ref >40)
LDL Cholesterol: 56 mg/dL (ref 0–99)
Total CHOL/HDL Ratio: 3.4 RATIO
Triglycerides: 150 mg/dL — ABNORMAL HIGH (ref <150)
VLDL: 30 mg/dL (ref 0–40)

## 2022-01-20 LAB — HEMOGLOBIN A1C
Hgb A1c MFr Bld: 7 % — ABNORMAL HIGH (ref 4.8–5.6)
Mean Plasma Glucose: 154.2 mg/dL

## 2022-01-20 LAB — TSH: TSH: 1.184 u[IU]/mL (ref 0.350–4.500)

## 2022-01-20 LAB — POC SARS CORONAVIRUS 2 AG: SARSCOV2ONAVIRUS 2 AG: NEGATIVE

## 2022-01-20 LAB — RESP PANEL BY RT-PCR (FLU A&B, COVID) ARPGX2
Influenza A by PCR: NEGATIVE
Influenza B by PCR: NEGATIVE
SARS Coronavirus 2 by RT PCR: NEGATIVE

## 2022-01-20 MED ORDER — AMLODIPINE BESYLATE 10 MG PO TABS
10.0000 mg | ORAL_TABLET | Freq: Every day | ORAL | Status: DC
  Administered 2022-01-21: 10 mg via ORAL
  Filled 2022-01-20: qty 1

## 2022-01-20 MED ORDER — CLOPIDOGREL BISULFATE 75 MG PO TABS
75.0000 mg | ORAL_TABLET | Freq: Every day | ORAL | Status: DC
  Administered 2022-01-20: 75 mg via ORAL
  Filled 2022-01-20: qty 1

## 2022-01-20 MED ORDER — FLUTICASONE PROPIONATE 50 MCG/ACT NA SUSP
2.0000 | Freq: Every day | NASAL | Status: DC | PRN
Start: 2022-01-20 — End: 2022-01-21

## 2022-01-20 MED ORDER — NITROGLYCERIN 0.4 MG SL SUBL: 0.4000 mg | SUBLINGUAL_TABLET | SUBLINGUAL | Status: DC | PRN

## 2022-01-20 MED ORDER — POLYVINYL ALCOHOL 1.4 % OP SOLN: 2.0000 [drp] | Freq: Every day | OPHTHALMIC | Status: DC | PRN

## 2022-01-20 MED ORDER — PRAVASTATIN SODIUM 10 MG PO TABS
80.0000 mg | ORAL_TABLET | Freq: Every day | ORAL | Status: DC
  Administered 2022-01-20: 80 mg via ORAL
  Filled 2022-01-20: qty 8

## 2022-01-20 MED ORDER — METOPROLOL TARTRATE 50 MG PO TABS
100.0000 mg | ORAL_TABLET | Freq: Two times a day (BID) | ORAL | Status: DC
  Administered 2022-01-20 – 2022-01-21 (×2): 100 mg via ORAL
  Filled 2022-01-20 (×2): qty 2

## 2022-01-20 MED ORDER — MAGNESIUM HYDROXIDE 400 MG/5ML PO SUSP: 30.0000 mL | Freq: Every day | ORAL | Status: DC | PRN

## 2022-01-20 MED ORDER — HYDROXYZINE HCL 25 MG PO TABS
25.0000 mg | ORAL_TABLET | Freq: Three times a day (TID) | ORAL | Status: DC | PRN
Start: 2022-01-20 — End: 2022-01-21
  Administered 2022-01-20: 25 mg via ORAL
  Filled 2022-01-20: qty 1

## 2022-01-20 MED ORDER — ALUM & MAG HYDROXIDE-SIMETH 200-200-20 MG/5ML PO SUSP: 30.0000 mL | ORAL | Status: DC | PRN

## 2022-01-20 MED ORDER — DOXEPIN HCL 10 MG PO CAPS: 10.0000 mg | ORAL_CAPSULE | Freq: Every evening | ORAL | Status: DC | PRN

## 2022-01-20 MED ORDER — ACETAMINOPHEN 325 MG PO TABS: 650.0000 mg | ORAL_TABLET | Freq: Four times a day (QID) | ORAL | Status: DC | PRN

## 2022-01-20 MED ORDER — RAMELTEON 8 MG PO TABS
8.0000 mg | ORAL_TABLET | Freq: Every evening | ORAL | Status: DC | PRN
  Administered 2022-01-20: 8 mg via ORAL
  Filled 2022-01-20: qty 1

## 2022-01-20 MED ORDER — METFORMIN HCL 500 MG PO TABS
1000.0000 mg | ORAL_TABLET | Freq: Two times a day (BID) | ORAL | Status: DC
  Administered 2022-01-20 – 2022-01-21 (×2): 1000 mg via ORAL
  Filled 2022-01-20 (×2): qty 2

## 2022-01-20 MED ORDER — ASPIRIN 81 MG PO TBEC
81.0000 mg | DELAYED_RELEASE_TABLET | Freq: Every day | ORAL | Status: DC
  Administered 2022-01-20: 81 mg via ORAL
  Filled 2022-01-20: qty 1

## 2022-01-20 MED ORDER — EZETIMIBE 10 MG PO TABS
10.0000 mg | ORAL_TABLET | Freq: Every day | ORAL | Status: DC
Start: 2022-01-20 — End: 2022-01-21
  Administered 2022-01-20 – 2022-01-21 (×2): 10 mg via ORAL
  Filled 2022-01-20 (×2): qty 1

## 2022-01-20 MED ORDER — NICOTINE 21 MG/24HR TD PT24: 21.0000 mg | MEDICATED_PATCH | Freq: Every day | TRANSDERMAL | Status: DC | PRN

## 2022-01-20 NOTE — Progress Notes (Signed)
Patient remains calm and cooperative on the unit, not responding to internal stimuli, patient received dinner and metformin. Patient reading magazines, able to preform ADLs. No acute behaviors observed during shift.

## 2022-01-20 NOTE — ED Notes (Signed)
Pt A&O x 4, no distress noted, eating at present.  Monitoring for safety.

## 2022-01-20 NOTE — ED Notes (Signed)
Pt sleeping at present, no distress noted.  Monitoring for safety. 

## 2022-01-20 NOTE — Progress Notes (Signed)
Patient scheduled  Zetia  medication, patient refused other scheduled medication stating, " I had those today already."

## 2022-01-20 NOTE — Progress Notes (Signed)
Patient alert and oriented X 4, denies SI, HI and AVH. Patient admitted to the observation unit after going to [facility] for substance use treatment. Patient states, " [facility] sent me here first." Patient is cooperative and calm. Given meal on unit. Skin is intact.

## 2022-01-20 NOTE — ED Provider Notes (Signed)
[facility] Continuous Assessment Admission H&P  Date: 01/20/22 Patient Name: [name] MRN: [id number] Chief Complaint:  Chief Complaint  Patient presents with   Alcohol Problem      Diagnoses:  Final diagnoses:  None    HPI:   [name] is a 61 y.o. male with PMHx of coronary disease, hypertension, dyslipidemia, diabetes type 2, and microvascular angina and past psychiatric history of major depressive disorder, generalized anxiety disorder, and alcohol use disorder who presented by self voluntarily as a direct admit from the community to [facility]) (01/20/2022) as a referral from [facility] for alcohol detoxification.  The patient presents for seeking inpatient treatment for alcohol dependence, namely, to the [facility] 28 day sobriety program. According to the patient, his initial urine analysis during the intake process showed the presence of benzodiazepines.  He takes a variety of medications for co-morbid physical health conditions, including Plavix, aspirin, metformin, lidocaine, and others. He also mentioned taking a muscle relaxant, cyclobenzaprine (Flexeril), for neck pain about three days ago, which is an old prescription. The patient expressed bewilderment at testing positive for benzodiazepines, as he does not know what the substance is and wonders if any of his current medications are considered as benzodiazepines.  He states he had previously tried to get admitted into [facility] but was turned away twice due to his high blood glucose, which he monitors for his known diagnosis of diabetes, though today showed a significant improvement from 268 to 129. After being turned away again today, the patient said he would respect the process and comply with what medical professionals tell him to do.  He also has a history of major clinical depression and anxiety, and previously took antidepressant medications, which he stopped at the beginning of this year,  now manages through self-reported "healthy things." The patient said he had been feeling better, and for many years did not take any antidepressant medications. He describes his mood as "good" and "happy" at the time of the interview  He endorses a history of suicidal ideation and attempts in his early 39s, and endorsed thoughts of not wanting to be alive, with the last episode being a year ago.   He did not express any particular beliefs about suicide. He denied any thoughts of harming others and had no history of head trauma. The patient has some college education and reported no physical discomfort or issues at the moment.  Family History:  The patient reported a family history of suicide on both his mother's and father's side, namely an uncle and a great grandmother.  Social History:  The patient identifies as an introvert, preferring solitude and maintaining a small number of deep friendships. The patient stated he has been playing music for self-gratification, playing a variety of instruments including the bass guitar, guitar, keyboards, and cello. He mentioned having a keen interest in music theory.  The patient is a widower, with his wife having passed away three years prior. He has two children who are married and live on different ends of the country. The patient reported significant changes in his life, including the death of parents and spouse, brother being gone from his life, and as well as his children moving away. 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 The patient identifies as a devoted [place].  Substance Use:  The patient acknowledges the use of caffeine, with a daily morning cup of coffee.  He reports binge drinking  behavior, usually consuming two bottles of wine one day a week and occasionally a pint of gin. His last drink was reportedly on June 28th. When he drinks, he also smokes tobacco (around one pack per day) and has been doing so  intermittently since he was 61 years old.  He admits to occasional cocaine use when provided by others, last used on June 27th or 28th, but he does not buy it himself. He used marijuana during his high school years but has not reported use since then.  The patient denies the use of other substances such as methamphetamines, heroin, or LSD. He denies past or present IVDU.  PHQ 2-9:  [place] Visit from 01/08/2022 in [place] Video Visit from 11/19/2021 in [facility] Clinical Support from 07/07/2021 in [facility]  Thoughts that you would be better off dead, or of hurting yourself in some way Not at all Not at all Nearly every day  PHQ-9 Total Score _0 Flowsheet Row ED from 01/20/2022 in [facility] ED from 10/10/2021 in [facility] ED from 07/07/2021 in [place] No Risk No Risk Low Risk        Total Time spent with patient: 45 minutes  Psychiatric Specialty Exam  Presentation General Appearance: Appropriate for Environment; Casual; Fairly Groomed   Eye Contact:Good   Speech:Clear and Coherent; Normal Rate   Speech Volume:Normal   Handedness:Right    Mood and Affect  Mood:-- ("good")   Affect:-- (bright)    Thought Process  Thought Processes:Coherent; Goal Directed; Linear   Descriptions of Associations:Intact   Orientation:Full (Time, Place and Person)   Thought Content:WDL; Logical   Diagnosis of Schizophrenia or Schizoaffective disorder in past: No data recorded   Hallucinations:No data recorded  Ideas of Reference:None   Suicidal Thoughts:No data recorded  Homicidal Thoughts:No data recorded   Sensorium  Memory:Immediate Good; Recent Good; Remote Good   Judgment:Good   Insight:Good    Executive Functions   Concentration:Good   Attention Span:Good   [place] of Knowledge:Good   Language:Good    Psychomotor Activity  Psychomotor Activity:No data recorded   Assets  Assets:Communication Skills; Desire for Improvement; Social Support; Resilience    Sleep  Sleep:No data recorded   No data recorded  Physical Exam ROS  Blood pressure 113/78, pulse 63, temperature 97.7 F (36.5 C), temperature source Oral, resp. rate 18, height _1  (1.727 m), weight 191 lb (86.6 kg), SpO2 100 %. Body mass index is 29.04 kg/m.  Past Psychiatric History: MDD, GAD, AUD   Is the patient at risk to self? No  Has the patient been a risk to self in the past 6 months? No .    Has the patient been a risk to self within the distant past? Yes   Is the patient a risk to others? No   Has the patient been a risk to others in the past 6 months? No   Has the patient been a risk to others within the distant past? No   Past Medical History:  Past Medical History:  Diagnosis Date   Alcohol dependence ([place])    Allergies    Arthritis    Chest pain    Chronic lower back pain    Chronic pain of right wrist    Coronary artery disease  a. Multiple prior caths/PCI. Cath 2013 with possible spasm of RCA, 70% ISR of mid LCx with subsequent DES to mLCx and prox LCX. b. H/o microvascular angina. c. Recurrent angina 08/2014 - s/p PTCA/DES to prox Cx, PTCA/CBA to OM1.  c. LHC 06/10/15 with patent stents and some ISR in LCX and OM-1 that was not flow limiting --> Rx    Dyslipidemia    a. Intolerant to many statins except tolerating Livalo.   GERD (gastroesophageal reflux disease)    H/O cardiac catheterization 10/25/2018   Heart attack [facility])    Hypertension    Myocardial infarction [facility]) ~ 2010   S/P angioplasty with stent, DES, to proximal and mid LCX 12/15/11 12/15/2011   S/P foot surgery, right 04/2021   Shoulder pain    Stroke ([place])    pt. reports had a stroke around time of MI 2010   Type  II diabetes mellitus ([place])    Unstable angina [facility])     Past Surgical History:  Procedure Laterality Date   CARDIAC CATHETERIZATION  06/15/2002   LAD with prox 40% stenosis, norma L main, Cfx with 25% lesion, RCA with long mid 25% stenosis (Dr. Vita Barley)   CARDIAC CATHETERIZATION  04/01/2010   normal L main, LAD wit mild stenosis, L Cfx with 70% in-stent restenosis, RCA with 70% in-stent restenosis, LVEF >60% (Dr. K. Mali Hilty) - cutting ballon arthrectomy to RCA & Cfx (Dr. Rockne Menghini)   CARDIAC CATHETERIZATION  08/25/2010   preserved global LV contractility; multivessel CAD, diffuse 90-95% in-stent restenosis in prox placed Cfx stent - cutting balloon arthrectomy in Cfx with multiple dilatations 90-95% to 0% stenosis (Dr. Corky Downs)   CARDIAC CATHETERIZATION  01/26/2011   PCI & stenting of aggresive in-stent restenosis within previously stented AV groove Cfx with 3.0x37m Taxus DES (previous stents were Promus) (Dr. JAdora Fridge   CARDIAC CATHETERIZATION  05/11/2011   preserved LV function, 40% mid LAD stenosis, 30-40% narrowing proximal to stented semgnet of prox Cfx, patent mid RCA stent with smooth 20% narrowing in distal RCA (Dr. TCorky Downs   CARDIAC CATHETERIZATION  12/15/2011   PCI & stenting of proximal & mid Cfx with DES - 3.0x179min proximal, 3.0x156mn mid (Dr. J. Adora Fridge [place] 06/10/2015   Procedure: Left Heart Cath and Coronary Angiography;  Surgeon: DanJolaine ArtistD;  Location: [facility] [place] [facility];  Service: Cardiovascular;  Laterality: N/A;   CARDIAC CATHETERIZATION  04/95/18/8416cardiometabolic testing  08/14/04/3016good exercise effort, peak VO2 79% predicted with normal VO2 HR curves (mild deconditioning)   COLONOSCOPY  12/2012   diminutive hyperplastic sigmoid poyp so repeat routine 2024   CORONARY BALLOON ANGIOPLASTY N/A 10/25/2018   Procedure: CORONARY BALLOON ANGIOPLASTY;  Surgeon: VarJettie BoozeD;  Location: [facility] [place] [facility];  Service:  Cardiovascular;  Laterality: N/A;   CORONARY BALLOON ANGIOPLASTY N/A 09/29/2019   Procedure: CORONARY BALLOON ANGIOPLASTY;  Surgeon: EndNelva BushD;  Location: [facility] [place] [facility];  Service: Cardiovascular;  Laterality: N/A;   EXCISIONAL HEMORRHOIDECTOMY  1984   INTRAVASCULAR ULTRASOUND/IVUS N/A 09/29/2019   Procedure: Intravascular Ultrasound/IVUS;  Surgeon: EndNelva BushD;  Location: [facility] [place] [facility];  Service: Cardiovascular;  Laterality: N/A;   LEFT HEART CATH AND CORONARY ANGIOGRAPHY N/A 10/25/2018   Procedure: LEFT HEART CATH AND CORONARY ANGIOGRAPHY;  Surgeon: VarJettie BoozeD;  Location: [facility] [place] [facility];  Service: Cardiovascular;  Laterality: N/A;   LEFT HEART CATH AND CORONARY  ANGIOGRAPHY N/A 09/29/2019   Procedure: LEFT HEART CATH AND CORONARY ANGIOGRAPHY;  Surgeon: Nelva Bush, MD;  Location: [place] [facility];  Service: Cardiovascular;  Laterality: N/A;   LEFT HEART CATHETERIZATION WITH CORONARY ANGIOGRAM N/A 05/11/2011   Procedure: LEFT HEART CATHETERIZATION WITH CORONARY ANGIOGRAM;  Surgeon: Troy Sine, MD;  Location: [facility] CATH LAB;  Service: Cardiovascular;  Laterality: N/A;  Possible percutaneous coronary intervention, possible IVUS   LEFT HEART CATHETERIZATION WITH CORONARY ANGIOGRAM N/A 12/15/2011   Procedure: LEFT HEART CATHETERIZATION WITH CORONARY ANGIOGRAM;  Surgeon: Lorretta Harp, MD;  Location: [facility] CATH LAB;  Service: Cardiovascular;  Laterality: N/A;   LEFT HEART CATHETERIZATION WITH CORONARY ANGIOGRAM N/A 09/05/2014   Procedure: LEFT HEART CATHETERIZATION WITH CORONARY ANGIOGRAM;  Surgeon: Burnell Blanks, MD;  Location: [facility] CATH LAB;  Service: Cardiovascular;  Laterality: N/A;   LIPOMA EXCISION     back of the head   NM MYOCAR PERF WALL MOTION  02/2012   lexiscan myoview; mild perfusion defect in mid inferolateral & basal inferolateral region (infarct/scar); EF 52%, abnormal but ow risk scan   PERCUTANEOUS CORONARY STENT INTERVENTION (PCI-S)   09/05/2014   Procedure: PERCUTANEOUS CORONARY STENT INTERVENTION (PCI-S);  Surgeon: Burnell Blanks, MD;  Location: [facility] CATH LAB;  Service: Cardiovascular;;   TONSILLECTOMY    make patient  Family History:  Family History  Problem Relation Age of Onset   Leukemia Mother    Prostate cancer Father    Cancer Brother    Coronary artery disease Paternal Grandmother    Cancer Paternal Grandfather    Migraines Neg Hx    Headache Neg Hx     Social History:  Social History   Socioeconomic History   Marital status: Widowed    Spouse name: Not on file   Number of children: 2   Years of education: GED   Highest education level: Not on file  Occupational History   Not on file  Tobacco Use   Smoking status: Former    Packs/day: 1.00    Years: 10.00    Total pack years: 10.00    Types: Cigarettes    Quit date: 10/07/2018    Years since quitting: 3.2   Smokeless tobacco: Never  Vaping Use   Vaping Use: Never used  Substance and Sexual Activity   Alcohol use: Yes    Comment: 2 bottles of wine on 12/08/2019 (one occurrence)   Drug use: Not Currently    Types: Cocaine    Comment: last use several months ago   Sexual activity: Not Currently  Other Topics Concern   Not on file  Social History Narrative   ** Merged History Encounter **    Pt states he only smoke when he drinks   Social Determinants of Health   Financial Resource Strain: Low Risk  (12/11/2021)   Overall Financial Resource Strain (CARDIA)    Difficulty of Paying Living Expenses: Not very hard  Food Insecurity: Not on file  Transportation Needs: No Transportation Needs (12/11/2021)   PRAPARE - Hydrologist (Medical): No    Lack of Transportation (Non-Medical): No  Physical Activity: Not on file  Stress: Not on file  Social Connections: Not on file  Intimate Partner Violence: Not on file    SDOH:  SDOH Screenings   Alcohol Screen: Medium Risk (08/27/2020)   Alcohol Screen     Last Alcohol Screening Score (AUDIT): 27  Depression (PHQ2-9): Medium Risk (01/08/2022)   Depression (PHQ2-9)  PHQ-2 Score: 14  Financial Resource Strain: Low Risk  (12/11/2021)   Overall Financial Resource Strain (CARDIA)    Difficulty of Paying Living Expenses: Not very hard  Food Insecurity: Not on file  Housing: Low Risk  (12/11/2021)   Housing    Last Housing Risk Score: 0  Physical Activity: Not on file  Social Connections: Not on file  Stress: Not on file  Tobacco Use: Medium Risk (01/08/2022)   Patient History    Smoking Tobacco Use: Former    Smokeless Tobacco Use: Never    Passive Exposure: Not on file  Transportation Needs: No Transportation Needs (12/11/2021)   PRAPARE - Transportation    Lack of Transportation (Medical): No    Lack of Transportation (Non-Medical): No    Last Labs:  Admission on 01/20/2022  Component Date Value Ref Range Status   SARSCOV2ONAVIRUS 2 AG 01/20/2022 NEGATIVE  NEGATIVE Final   Comment: (NOTE) SARS-CoV-2 antigen NOT DETECTED.   Negative results are presumptive.  Negative results do not preclude SARS-CoV-2 infection and should not be used as the sole basis for treatment or other patient management decisions, including infection  control decisions, particularly in the presence of clinical signs and  symptoms consistent with COVID-19, or in those who have been in contact with the virus.  Negative results must be combined with clinical observations, patient history, and epidemiological information. The expected result is Negative.  Fact Sheet for Patients: HandmadeRecipes.com.cy  Fact Sheet for Healthcare Providers: FuneralLife.at  This test is not yet approved or cleared by the [place] FDA and  has been authorized for detection and/or diagnosis of SARS-CoV-2 by FDA under an Emergency Use Authorization (EUA).  This EUA will remain in effect (meaning this test can be used) for the  duration of  the COV                          ID-19 declaration under Section 564(b)(1) of the Act, 21 U.S.C. section 360bbb-3(b)(1), unless the authorization is terminated or revoked sooner.    Office Visit on 01/08/2022  Component Date Value Ref Range Status   Hemoglobin A1C 01/08/2022 7.1 (A)  4.0 - 5.6 % Final   HbA1c POC (<> result, manual entry) 01/08/2022 7.1  4.0 - 5.6 % Final   HbA1c, POC (prediabetic range) 01/08/2022 7.1 (A)  5.7 - 6.4 % Final   HbA1c, POC (controlled diabetic ra* 01/08/2022 7.1 (A)  0.0 - 7.0 % Final   POC Glucose 01/08/2022 169 (A)  70 - 99 mg/dl Final  Office Visit on 12/04/2021  Component Date Value Ref Range Status   Hgb A1c MFr Bld 12/04/2021 7.0 (H)  4.8 - 5.6 % Final   Comment:          Prediabetes: 5.7 - 6.4          Diabetes: >6.4          Glycemic control for adults with diabetes: <7.0    Est. average glucose Bld gHb Est-m* 12/04/2021 154  mg/dL Final   WBC 12/04/2021 5.0  3.4 - 10.8 x10E3/uL Final   RBC 12/04/2021 5.09  4.14 - 5.80 x10E6/uL Final   Hemoglobin 12/04/2021 14.3  13.0 - 17.7 g/dL Final   Hematocrit 12/04/2021 43.0  37.5 - 51.0 % Final   MCV 12/04/2021 85  79 - 97 fL Final   MCH 12/04/2021 28.1  26.6 - 33.0 pg Final   MCHC 12/04/2021 33.3  31.5 - 35.7 g/dL  Final   RDW 12/04/2021 12.8  11.6 - 15.4 % Final   Platelets 12/04/2021 240  150 - 450 x10E3/uL Final   Glucose 12/04/2021 157 (H)  70 - 99 mg/dL Final   BUN 12/04/2021 14  8 - 27 mg/dL Final   Creatinine, Ser 12/04/2021 1.03  0.76 - 1.27 mg/dL Final   eGFR 12/04/2021 83  >59 mL/min/1.73 Final   BUN/Creatinine Ratio 12/04/2021 14  10 - 24 Final   Sodium 12/04/2021 140  134 - 144 mmol/L Final   Potassium 12/04/2021 4.5  3.5 - 5.2 mmol/L Final   Chloride 12/04/2021 105  96 - 106 mmol/L Final   CO2 12/04/2021 22  20 - 29 mmol/L Final   Calcium 12/04/2021 9.4  8.6 - 10.2 mg/dL Final   Total Protein 12/04/2021 7.0  6.0 - 8.5 g/dL Final   Albumin 12/04/2021 4.7  3.8 - 4.9 g/dL  Final   Globulin, Total 12/04/2021 2.3  1.5 - 4.5 g/dL Final   Albumin/Globulin Ratio 12/04/2021 2.0  1.2 - 2.2 Final   Bilirubin Total 12/04/2021 0.3  0.0 - 1.2 mg/dL Final   Alkaline Phosphatase 12/04/2021 74  44 - 121 IU/L Final   AST 12/04/2021 22  0 - 40 IU/L Final   ALT 12/04/2021 53 (H)  0 - 44 IU/L Final  Office Visit on 08/04/2021  Component Date Value Ref Range Status   Hemoglobin A1C 08/04/2021 6.3 (A)  4.0 - 5.6 % Final   HbA1c POC (<> result, manual entry) 08/04/2021 6.3  4.0 - 5.6 % Final   HbA1c, POC (prediabetic range) 08/04/2021 6.3  5.7 - 6.4 % Final   HbA1c, POC (controlled diabetic ra* 08/04/2021 6.3  0.0 - 7.0 % Final   Color, UA 08/04/2021 yellow  yellow Final   Clarity, UA 08/04/2021 clear  clear Final   Glucose, UA 08/04/2021 negative  negative mg/dL Final   Bilirubin, UA 08/04/2021 negative  negative Final   Ketones, POC UA 08/04/2021 negative  negative mg/dL Final   Spec Grav, UA 08/04/2021 1.010  1.010 - 1.025 Final   Blood, UA 08/04/2021 negative  negative Final   pH, UA 08/04/2021 5.5  5.0 - 8.0 Final   POC PROTEIN,UA 08/04/2021 negative  negative, trace Final   Urobilinogen, UA 08/04/2021 0.2  0.2 or 1.0 E.U./dL Final   Nitrite, UA 08/04/2021 Negative  Negative Final   Leukocytes, UA 08/04/2021 Negative  Negative Final   Creatinine, Urine 08/04/2021 18.0  Not Estab. mg/dL Final   Microalbumin, Urine 08/04/2021 58.6  Not Estab. ug/mL Final   Microalb/Creat Ratio 08/04/2021 326 (H)  0 - 29 mg/g creat Final   Comment:                        Normal:                0 -  29                        Moderately increased: 30 - 300                        Severely increased:       >300     Allergies: Bee venom, Gadolinium, Shellfish allergy, Statins, and Testosterone cypionate  PTA Medications: (Not in a hospital admission)    Medical Decision Making  Rationale for Continuous Observation: The patient denies active or passive suicidal ideation.  He also  denies HI. He denies AH/VH and does not appear to be internally preoccupied.  He presents seeking alcohol detoxification, but does not quite meet criteria for [facility] admission as his last drink was 12/24/21. It seems that the patient is intent on obtaining services for inpatient rehabilitation for alcohol use disorder, though he seems to be achieving sobriety sufficiently enough to not need residential treatment.  The patient adamantly denies benzodiazepine use and is quite perplexed at the positive findings in his urine. Review of his medications and the PDMP do not reveal any obvious medications that could potentially cause this finding. If the patient is being truthful about his recreational substance use history, it is possible that his most recent cocaine use (12/24/21) was laced with benzodiazepines and thus resulting in the positive urine findings.  The patient wishes to be admitted for continuous observation overnight. A referral has been placed for [facility] on 7/27 at 7:45 am.    Recommendations  Based on my evaluation the patient does not appear to have an emergency medical condition.  - ADMIT to [facility] observation - CONTINUE home meds (see ordered home meds) for diabetes, cardioprotection, and lipid management agents - START ramelteon 8 mg QHS PRN for difficulty initiating sleep - START low-dose doxepin 10 mg QHS PRN for difficulty initiating and maintaining sleep  I discussed my assessment and planned treatment for the patient with Dr. Dwyane Dee who agrees with my formulated course of action.  Camelia Phenes, MD 01/20/22  2:32 PM

## 2022-01-20 NOTE — BH Assessment (Signed)
Comprehensive Clinical Assessment (CCA) Screening, Triage and Referral Note  01/20/2022 [name] [id number]  Disposition: Per Camelia Phenes, MD pt is recommended for observation.   [facility] from 01/20/2022 in [facility] ED from 10/10/2021 in [facility] ED from 07/07/2021 in [place] No Risk No Risk Low Risk      The patient demonstrates the following risk factors for suicide: Chronic risk factors for suicide include: psychiatric disorder of depression and substance use disorder. Acute risk factors for suicide include: family or marital conflict. Protective factors for this patient include: positive social support, positive therapeutic relationship, and hope for the future. Considering these factors, the overall suicide risk at this point appears to be low. Patient is appropriate for outpatient follow up.  Chief Complaint:  Chief Complaint  Patient presents with   Alcohol Problem   Visit Diagnosis: Alcohol use   Patient Reported Information How did you hear about [place]? Other (Comment)  What Is the Reason for Your Visit/Call Today? Pt was going through intake process at [facility] for rehab treatment. After taking drug test pt reports he was positive for benzos but denies using any drugs. Pt reports [facility] referred him here for detox before receiving treatment. Pt reports drinking about 2 bottles of dinner wine a day for the past seven months. Two weeks ago pt reports he was at [facility] for in [facility] for detox with plans to transition to the 28 day program but his blood sugar was too high. Pt denies SI, HI, AVH and is here for detox.  Patient is oriented x4, engaged, alert and cooperative during assessment. Patient eye contact and speech is normal, his affect is appropriate with congruent mood. Patient does not appear psychotic, delusional or manic.    How Long Has This  Been Causing You Problems? > than 6 months  What Do You Feel Would Help You the Most Today? Alcohol or Drug Use Treatment   Have You Recently Had Any Thoughts About Hurting Yourself? No  Are You Planning to Commit Suicide/Harm Yourself At This time? No   Have you Recently Had Thoughts About [place]? No  Are You Planning to Harm Someone at This Time? No  Explanation: No data recorded  Have You Used Any Alcohol or Drugs in the Past 24 Hours? No  How Long Ago Did You Use Drugs or Alcohol? No data recorded What Did You Use and How Much? No data recorded  Do You Currently Have a Therapist/Psychiatrist? Yes  Name of Therapist/Psychiatrist: No data recorded  Have You Been Recently Discharged From Any Office Practice or Programs? No  Explanation of Discharge From Practice/Program: No data recorded   CCA Screening Triage Referral Assessment Type of Contact: Face-to-Face  Telemedicine Service Delivery:   Is this Initial or Reassessment? No data recorded Date Telepsych consult ordered in [facility]:  No data recorded Time Telepsych consult ordered in [facility]:  No data recorded Location of Assessment: [facility] Assessment Services  Provider Location: [facility] Assessment Services   Collateral Involvement: none   Does Patient Have a [place]? No data recorded Name and Contact of Legal Guardian: No data recorded If Minor and Not Living with Parent(s), Who has Custody? No data recorded Is CPS involved or ever been involved? Never  Is APS involved or ever been involved? Never   Patient Determined To Be At Risk for Harm To Self  or Others Based on Review of Patient Reported Information or Presenting Complaint? No  Method: No data recorded Availability of Means: No data recorded Intent: No data recorded Notification Required: No data recorded Additional Information for Danger to Others Potential: No data recorded Additional Comments for Danger to Others  Potential: No data recorded Are There Guns or Other Weapons in Your Home? No data recorded Types of Guns/Weapons: No data recorded Are These Weapons Safely Secured?                            No data recorded Who Could Verify You Are Able To Have These Secured: No data recorded Do You Have any Outstanding Charges, Pending Court Dates, Parole/Probation? No data recorded Contacted To Inform of Risk of Harm To Self or Others: No data recorded  Does Patient Present under Involuntary Commitment? No  IVC Papers Initial File Date: No data recorded  [place] of Residence: [place]   Patient Currently Receiving the Following Services: Medication Management   Determination of Need: Urgent (48 hours)   Options For Referral: Facility-Based Crisis; [facility]   Discharge Disposition:     Luther Redo, [facility]

## 2022-01-20 NOTE — BH Assessment (Signed)
Pt was going through intake process at [facility] today for rehab treatment. After taking drug test pt reports he was positive for benzos but denies using any drugs. Pt reports [facility] referred him here for detox before receiving treatment. Pt reports drinking about 2 bottles of dinner wine a day for the past seven months. Two weeks ago pt reports he was at [facility] in [facility] for detox with plans to transition to the 28 day program but his blood sugar was too high. Pt denies SI, HI, AVH and is here for detox. Denies withdrawal symptoms nor does he have a history of seizures. Pt reports dx of "clinical depression" and receives outpatient services at [facility].

## 2022-01-21 LAB — POCT URINE DRUG SCREEN - MANUAL ENTRY (I-SCREEN)
POC Amphetamine UR: NOT DETECTED
POC Buprenorphine (BUP): NOT DETECTED
POC Cocaine UR: NOT DETECTED
POC Marijuana UR: NOT DETECTED
POC Methadone UR: NOT DETECTED
POC Methamphetamine UR: NOT DETECTED
POC Morphine: NOT DETECTED
POC Oxazepam (BZO): POSITIVE — AB
POC Oxycodone UR: NOT DETECTED
POC Secobarbital (BAR): NOT DETECTED

## 2022-01-21 NOTE — Discharge Instructions (Addendum)
You have a follow-up appointment with [name]. 22 January 2022 at 7:45 am. You will need to bring all of the paperwork given to you at discharge to your appointment.

## 2022-01-21 NOTE — ED Notes (Signed)
Pt sleeping at present, no distress noted.  Monitoring for safety. 

## 2022-01-21 NOTE — ED Provider Notes (Signed)
FBC/OBS ASAP Discharge Summary  Date and Time: 01/21/2022 10:23 AM  Name: [name]  MRN:  [id number]   Discharge Diagnoses:  Final diagnoses:  None    Subjective: Per HPI: "[name] is a 61 y.o. male with PMHx of coronary disease, hypertension, dyslipidemia, diabetes type 2, and microvascular angina and past psychiatric history of major depressive disorder, generalized anxiety disorder, and alcohol use disorder who presented by self voluntarily as a direct admit from the community to [facility]) (01/20/2022) as a referral from [facility] for alcohol detoxification.   The patient presents for seeking inpatient treatment for alcohol dependence, namely, to the [facility] 28 day sobriety program. According to the patient, his initial urine analysis during the intake process showed the presence of benzodiazepines.   He takes a variety of medications for co-morbid physical health conditions, including Plavix, aspirin, metformin, lidocaine, and others. He also mentioned taking a muscle relaxant, cyclobenzaprine (Flexeril), for neck pain about three days ago, which is an old prescription. The patient expressed bewilderment at testing positive for benzodiazepines, as he does not know what the substance is and wonders if any of his current medications are considered as benzodiazepines.   He states he had previously tried to get admitted into [facility] but was turned away twice due to his high blood glucose, which he monitors for his known diagnosis of diabetes, though today showed a significant improvement from 268 to 129. After being turned away again today, the patient said he would respect the process and comply with what medical professionals tell him to do.   He also has a history of major clinical depression and anxiety, and previously took antidepressant medications, which he stopped at the beginning of this year, now manages through self-reported "healthy things." The  patient said he had been feeling better, and for many years did not take any antidepressant medications. He describes his mood as "good" and "happy" at the time of the interview   He endorses a history of suicidal ideation and attempts in his early 68s, and endorsed thoughts of not wanting to be alive, with the last episode being a year ago.    He did not express any particular beliefs about suicide. He denied any thoughts of harming others and had no history of head trauma. The patient has some college education and reported no physical discomfort or issues at the moment.   Family History:   The patient reported a family history of suicide on both his mother's and father's side, namely an uncle and a great grandmother.   Social History:   The patient identifies as an introvert, preferring solitude and maintaining a small number of deep friendships. The patient stated he has been playing music for self-gratification, playing a variety of instruments including the bass guitar, guitar, keyboards, and cello. He mentioned having a keen interest in music theory.   The patient is a widower, with his wife having passed away three years prior. He has two children who are married and live on different ends of the country. The patient reported significant changes in his life, including the death of parents and spouse, brother being gone from his life, and as well as his children moving away. 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 The patient identifies as a devoted [place].   Substance Use:   The patient acknowledges the use of caffeine, with a daily morning cup of coffee.   He reports  binge drinking behavior, usually consuming two bottles of wine one day a week and occasionally a pint of gin. His last drink was reportedly on June 28th. When he drinks, he also smokes tobacco (around one pack per day) and has been doing so intermittently since he was 61 years old.   He  admits to occasional cocaine use when provided by others, last used on June 27th or 28th, but he does not buy it himself. He used marijuana during his high school years but has not reported use since then.   The patient denies the use of other substances such as methamphetamines, heroin, or LSD. He denies past or present IVDU."  Patient's narrative on day of discharge: Patient denies SI/HI. Denies AH/VH. Patient slept well, appetite unchanged. Patient able to contract for safety  Stay Summary: Behavior during stay: calm, cooperative Medications during stay: given scheduled outpatient meds, given hydroxyzine PRN, given ramelteon PRN at bedtime  Total Time spent with patient: 20 minutes  Past Psychiatric and Medical History:  Past Medical History:  Diagnosis Date   Alcohol dependence ([place])    Allergies    Arthritis    Chest pain    Chronic lower back pain    Chronic pain of right wrist    Coronary artery disease    a. Multiple prior caths/PCI. Cath 2013 with possible spasm of RCA, 70% ISR of mid LCx with subsequent DES to mLCx and prox LCX. b. H/o microvascular angina. c. Recurrent angina 08/2014 - s/p PTCA/DES to prox Cx, PTCA/CBA to OM1.  c. LHC 06/10/15 with patent stents and some ISR in LCX and OM-1 that was not flow limiting --> Rx    Dyslipidemia    a. Intolerant to many statins except tolerating Livalo.   GERD (gastroesophageal reflux disease)    H/O cardiac catheterization 10/25/2018   Heart attack [facility])    Hypertension    Myocardial infarction [facility]) ~ 2010   S/P angioplasty with stent, DES, to proximal and mid LCX 12/15/11 12/15/2011   S/P foot surgery, right 04/2021   Shoulder pain    Stroke ([place])    pt. reports had a stroke around time of MI 2010   Type II diabetes mellitus ([place])    Unstable angina [facility])     Past Surgical History:  Procedure Laterality Date   CARDIAC CATHETERIZATION  06/15/2002   LAD with prox 40% stenosis, norma L main, Cfx with 25% lesion, RCA with  long mid 25% stenosis (Dr. Vita Barley)   CARDIAC CATHETERIZATION  04/01/2010   normal L main, LAD wit mild stenosis, L Cfx with 70% in-stent restenosis, RCA with 70% in-stent restenosis, LVEF >60% (Dr. K. Mali Hilty) - cutting ballon arthrectomy to RCA & Cfx (Dr. Rockne Menghini)   CARDIAC CATHETERIZATION  08/25/2010   preserved global LV contractility; multivessel CAD, diffuse 90-95% in-stent restenosis in prox placed Cfx stent - cutting balloon arthrectomy in Cfx with multiple dilatations 90-95% to 0% stenosis (Dr. Corky Downs)   CARDIAC CATHETERIZATION  01/26/2011   PCI & stenting of aggresive in-stent restenosis within previously stented AV groove Cfx with 3.0x59m Taxus DES (previous stents were Promus) (Dr. JAdora Fridge   CARDIAC CATHETERIZATION  05/11/2011   preserved LV function, 40% mid LAD stenosis, 30-40% narrowing proximal to stented semgnet of prox Cfx, patent mid RCA stent with smooth 20% narrowing in distal RCA (Dr. TCorky Downs   CARDIAC CATHETERIZATION  12/15/2011   PCI & stenting of proximal & mid Cfx with DES - 3.0x111m  in proximal, 3.0x68mm in mid (Dr. Adora Fridge)   CARDIAC CATHETERIZATION N/A 06/10/2015   Procedure: Left Heart Cath and Coronary Angiography;  Surgeon: Jolaine Artist, MD;  Location: [place] CV LAB;  Service: Cardiovascular;  Laterality: N/A;   CARDIAC CATHETERIZATION  89/16/9450   cardiometabolic testing  3/88/8280   good exercise effort, peak VO2 79% predicted with normal VO2 HR curves (mild deconditioning)   COLONOSCOPY  12/2012   diminutive hyperplastic sigmoid poyp so repeat routine 2024   CORONARY BALLOON ANGIOPLASTY N/A 10/25/2018   Procedure: CORONARY BALLOON ANGIOPLASTY;  Surgeon: Jettie Booze, MD;  Location: [place] [facility];  Service: Cardiovascular;  Laterality: N/A;   CORONARY BALLOON ANGIOPLASTY N/A 09/29/2019   Procedure: CORONARY BALLOON ANGIOPLASTY;  Surgeon: Nelva Bush, MD;  Location: [place] CV [facility];  Service: Cardiovascular;  Laterality:  N/A;   EXCISIONAL HEMORRHOIDECTOMY  1984   INTRAVASCULAR ULTRASOUND/IVUS N/A 09/29/2019   Procedure: Intravascular Ultrasound/IVUS;  Surgeon: Nelva Bush, MD;  Location: [place] CV LAB;  Service: Cardiovascular;  Laterality: N/A;   LEFT HEART CATH AND CORONARY ANGIOGRAPHY N/A 10/25/2018   Procedure: LEFT HEART CATH AND CORONARY ANGIOGRAPHY;  Surgeon: Jettie Booze, MD;  Location: [place] CV LAB;  Service: Cardiovascular;  Laterality: N/A;   LEFT HEART CATH AND CORONARY ANGIOGRAPHY N/A 09/29/2019   Procedure: LEFT HEART CATH AND CORONARY ANGIOGRAPHY;  Surgeon: Nelva Bush, MD;  Location: [place] [facility];  Service: Cardiovascular;  Laterality: N/A;   LEFT HEART CATHETERIZATION WITH CORONARY ANGIOGRAM N/A 05/11/2011   Procedure: LEFT HEART CATHETERIZATION WITH CORONARY ANGIOGRAM;  Surgeon: Troy Sine, MD;  Location: [facility] CATH LAB;  Service: Cardiovascular;  Laterality: N/A;  Possible percutaneous coronary intervention, possible IVUS   LEFT HEART CATHETERIZATION WITH CORONARY ANGIOGRAM N/A 12/15/2011   Procedure: LEFT HEART CATHETERIZATION WITH CORONARY ANGIOGRAM;  Surgeon: Lorretta Harp, MD;  Location: [facility] CATH LAB;  Service: Cardiovascular;  Laterality: N/A;   LEFT HEART CATHETERIZATION WITH CORONARY ANGIOGRAM N/A 09/05/2014   Procedure: LEFT HEART CATHETERIZATION WITH CORONARY ANGIOGRAM;  Surgeon: Burnell Blanks, MD;  Location: [facility] CATH LAB;  Service: Cardiovascular;  Laterality: N/A;   LIPOMA EXCISION     back of the head   NM MYOCAR PERF WALL MOTION  02/2012   lexiscan myoview; mild perfusion defect in mid inferolateral & basal inferolateral region (infarct/scar); EF 52%, abnormal but ow risk scan   PERCUTANEOUS CORONARY STENT INTERVENTION (PCI-S)  09/05/2014   Procedure: PERCUTANEOUS CORONARY STENT INTERVENTION (PCI-S);  Surgeon: Burnell Blanks, MD;  Location: [facility] CATH LAB;  Service: Cardiovascular;;   TONSILLECTOMY     Family History:  Family History   Problem Relation Age of Onset   Leukemia Mother    Prostate cancer Father    Cancer Brother    Coronary artery disease Paternal Grandmother    Cancer Paternal Grandfather    Migraines Neg Hx    Headache Neg Hx    Family Psychiatric History:  family history of suicide on both his mother's and father's side, namely an uncle and a great grandmother. Social History:  Social History   Substance and Sexual Activity  Alcohol Use Yes   Comment: 2 bottles of wine on 12/08/2019 (one occurrence)     Social History   Substance and Sexual Activity  Drug Use Not Currently   Types: Cocaine   Comment: last use several months ago    Social History   Socioeconomic History   Marital status: Widowed    Spouse  name: Not on file   Number of children: 2   Years of education: GED   Highest education level: Not on file  Occupational History   Not on file  Tobacco Use   Smoking status: Former    Packs/day: 1.00    Years: 10.00    Total pack years: 10.00    Types: Cigarettes    Quit date: 10/07/2018    Years since quitting: 3.2   Smokeless tobacco: Never  Vaping Use   Vaping Use: Never used  Substance and Sexual Activity   Alcohol use: Yes    Comment: 2 bottles of wine on 12/08/2019 (one occurrence)   Drug use: Not Currently    Types: Cocaine    Comment: last use several months ago   Sexual activity: Not Currently  Other Topics Concern   Not on file  Social History Narrative   ** Merged History Encounter **    Pt states he only smoke when he drinks   Social Determinants of Health   Financial Resource Strain: Low Risk  (12/11/2021)   Overall Financial Resource Strain (CARDIA)    Difficulty of Paying Living Expenses: Not very hard  Food Insecurity: Not on file  Transportation Needs: No Transportation Needs (12/11/2021)   PRAPARE - Hydrologist (Medical): No    Lack of Transportation (Non-Medical): No  Physical Activity: Not on file  Stress: Not on  file  Social Connections: Not on file   SDOH:  SDOH Screenings   Alcohol Screen: Medium Risk (08/27/2020)   Alcohol Screen    Last Alcohol Screening Score (AUDIT): 27  Depression (PHQ2-9): Medium Risk (01/08/2022)   Depression (PHQ2-9)    PHQ-2 Score: 14  Financial Resource Strain: Low Risk  (12/11/2021)   Overall Financial Resource Strain (CARDIA)    Difficulty of Paying Living Expenses: Not very hard  Food Insecurity: Not on file  Housing: Low Risk  (12/11/2021)   Housing    Last Housing Risk Score: 0  Physical Activity: Not on file  Social Connections: Not on file  Stress: Not on file  Tobacco Use: Medium Risk (01/08/2022)   Patient History    Smoking Tobacco Use: Former    Smokeless Tobacco Use: Never    Passive Exposure: Not on file  Transportation Needs: No Transportation Needs (12/11/2021)   PRAPARE - Hydrologist (Medical): No    Lack of Transportation (Non-Medical): No    Tobacco Cessation:  A prescription for an FDA-approved tobacco cessation medication provided at discharge  Current Medications:  Current Facility-Administered Medications  Medication Dose Route Frequency Provider Last Rate Last Admin   acetaminophen (TYLENOL) tablet 650 mg  650 mg Oral Q6H PRN Camelia Phenes, MD       alum & mag hydroxide-simeth (MAALOX/MYLANTA) 200-200-20 MG/5ML suspension 30 mL  30 mL Oral Q4H PRN Camelia Phenes, MD       amLODipine (NORVASC) tablet 10 mg  10 mg Oral Daily Camelia Phenes, MD   10 mg at 01/21/22 0816   aspirin EC tablet 81 mg  81 mg Oral Al Corpus, MD   81 mg at 01/20/22 2148   clopidogrel (PLAVIX) tablet 75 mg  75 mg Oral Al Corpus, MD   75 mg at 01/20/22 2148   doxepin (SINEQUAN) capsule 10 mg  10 mg Oral QHS PRN Camelia Phenes, MD       ezetimibe (ZETIA) tablet 10 mg  10 mg Oral Daily Camelia Phenes,  MD   10 mg at 01/21/22 0816   fluticasone (FLONASE) 50 MCG/ACT nasal spray 2 spray  2 spray Each Nare Daily PRN Camelia Phenes, MD        hydrOXYzine (ATARAX) tablet 25 mg  25 mg Oral TID PRN Camelia Phenes, MD   25 mg at 01/20/22 2148   magnesium hydroxide (MILK OF MAGNESIA) suspension 30 mL  30 mL Oral Daily PRN Camelia Phenes, MD       metFORMIN (GLUCOPHAGE) tablet 1,000 mg  1,000 mg Oral BID WC Camelia Phenes, MD   1,000 mg at 01/21/22 0816   metoprolol tartrate (LOPRESSOR) tablet 100 mg  100 mg Oral BID Camelia Phenes, MD   100 mg at 01/21/22 0815   nicotine (NICODERM CQ - dosed in mg/24 hours) patch 21 mg  21 mg Transdermal Daily PRN Camelia Phenes, MD       nitroGLYCERIN (NITROSTAT) SL tablet 0.4 mg  0.4 mg Sublingual Q5 Min x 3 PRN Camelia Phenes, MD       polyvinyl alcohol (LIQUIFILM TEARS) 1.4 % ophthalmic solution 2 drop  2 drop Both Eyes Daily PRN Camelia Phenes, MD       pravastatin (PRAVACHOL) tablet 80 mg  80 mg Oral Al Corpus, MD   80 mg at 01/20/22 2338   ramelteon (ROZEREM) tablet 8 mg  8 mg Oral QHS PRN Camelia Phenes, MD   8 mg at 01/20/22 2149   Current Outpatient Medications  Medication Sig Dispense Refill   Accu-Chek Softclix Lancets lancets SMARTSIG:1 Topical 4 Times Daily     amLODipine (NORVASC) 10 MG tablet Take 1 tablet by mouth once daily (Patient taking differently: Take 10 mg by mouth daily.) 90 tablet 1   aspirin 81 MG EC tablet Take 1 tablet (81 mg total) by mouth daily. (May buy from over the counter): Swallow whole for heart health (Patient taking differently: Take 81 mg by mouth at bedtime. (May buy from over the counter): Swallow whole for heart health) 30 tablet 0   blood glucose meter kit and supplies KIT Dispense based on patient and insurance preference. Use up to four times daily as directed. (FOR ICD-9 250.00, 250.01). 1 each 0   Blood Pressure Monitor KIT 1 kit by Does not apply route daily. 1 kit 0   clopidogrel (PLAVIX) 75 MG tablet Take 1 tablet by mouth once daily (Patient taking differently: Take 75 mg by mouth at bedtime.) 90 tablet 0   cyclobenzaprine (FLEXERIL) 10 MG tablet Take 10 mg by mouth 3  (three) times daily as needed for muscle spasms.     EPINEPHrine 0.3 mg/0.3 mL IJ SOAJ injection Inject 0.3 mg into the muscle as needed for anaphylaxis. 1 each 2   ezetimibe (ZETIA) 10 MG tablet Take 1 tablet (10 mg total) by mouth daily. 90 tablet 3   fluticasone (FLONASE) 50 MCG/ACT nasal spray Place 2 sprays into both nostrils daily. (Patient taking differently: Place 2 sprays into both nostrils daily as needed for allergies.) 16 g 6   glucose blood (ACCU-CHEK GUIDE) test strip Use as instructed 300 each 3   hydrOXYzine (ATARAX) 25 MG tablet Take 1 tablet (25 mg total) by mouth 3 (three) times daily as needed for anxiety. 90 tablet 3   LIVALO 4 MG TABS Take 1 tablet (4 mg total) by mouth daily. (Patient taking differently: Take 4 mg by mouth at bedtime.) 90 tablet 3   metFORMIN (GLUCOPHAGE) 1000 MG tablet Take 1 tablet (1,000 mg total) by  mouth 2 (two) times daily with a meal. 180 tablet 3   metoprolol tartrate (LOPRESSOR) 100 MG tablet Take 1 tablet (100 mg total) by mouth 2 (two) times daily. 180 tablet 3   nitroGLYCERIN (NITROSTAT) 0.4 MG SL tablet Place 1 tablet (0.4 mg total) under the tongue every 5 (five) minutes x 3 doses as needed for chest pain. 25 tablet 3   Omega-3 Fatty Acids (FISH OIL PO) Take 1 capsule by mouth daily.     Polyethyl Glycol-Propyl Glycol (SYSTANE) 0.4-0.3 % GEL ophthalmic gel Place 1 Application into both eyes daily as needed (For dry eyes).     sildenafil (VIAGRA) 100 MG tablet Take 1 tablet (100 mg total) by mouth daily as needed. (Patient taking differently: Take 100 mg by mouth daily as needed for erectile dysfunction.) 10 tablet 5   Testosterone (ANDROGEL PUMP) 20.25 MG/ACT (1.62%) GEL Place 1 Pump onto the skin daily. 75 g 2    PTA Medications: (Not in a hospital admission)       01/08/2022    8:44 AM 11/19/2021    8:36 AM 08/04/2021    8:21 AM  Depression screen PHQ 2/9  Decreased Interest 2 1 0  Down, Depressed, Hopeless 2 0 0  PHQ - 2 Score 4 1 0   Altered sleeping 2 0   Tired, decreased energy 2 0   Change in appetite 3 0   Feeling bad or failure about yourself  1 1   Trouble concentrating 2 0   Moving slowly or fidgety/restless 0 0   Suicidal thoughts 0 0   PHQ-9 Score 14 2   Difficult doing work/chores Not difficult at all Not difficult at all     [facility] ED from 01/20/2022 in [facility] ED from 10/10/2021 in [facility] ED from 07/07/2021 in [place] No Risk No Risk Low Risk        Psychiatric Specialty Exam  Presentation  General Appearance: Appropriate for Environment; Well Groomed   Eye Contact:Good   Speech:Clear and Coherent; Normal Rate   Speech Volume:Normal   Mood and Affect  Mood:Euthymic   Affect:Appropriate; Congruent; Full Range    Thought Process  Thought Processes:Coherent; Linear; Goal Directed   Descriptions of Associations:Intact   Orientation:Full (Time, Place and Person)   Thought Content:Logical; WDL   Diagnosis of Schizophrenia or Schizoaffective disorder in past: No data recorded    Hallucinations:Hallucinations: None   Ideas of Reference:None   Suicidal Thoughts:Suicidal Thoughts: No   Homicidal Thoughts:Homicidal Thoughts: No    Sensorium  Memory:Immediate Good; Recent Good; Remote Good   Judgment:Good   Insight:Good    Executive Functions  Concentration:Good   Attention Span:Good   [employer] Knowledge:Good   Language:Good    Psychomotor Activity  Psychomotor Activity:Psychomotor Activity: Normal    Assets  Assets:Communication Skills; Desire for Improvement; Financial Resources/Insurance; Housing; Leisure Time; Physical Health; Resilience; Social Support; Talents/Skills; Transportation    Sleep  Sleep:Sleep: Good    Nutritional Assessment (For OBS and FBC admissions only) Has the patient had a  weight loss or gain of 10 pounds or more in the last 3 months?: No Has the patient had a decrease in food intake/or appetite?: No Does the patient have dental problems?: No Does the patient have eating habits or behaviors that may be indicators of an eating disorder including binging or inducing vomiting?: No Has the patient recently  lost weight without trying?: 0 Has the patient been eating poorly because of a decreased appetite?: 0 Malnutrition Screening Tool Score: 0     Physical Exam  Physical Exam Vitals and nursing note reviewed.  Constitutional:      Appearance: Normal appearance.  HENT:     Head: Normocephalic and atraumatic.  Pulmonary:     Effort: Pulmonary effort is normal.  Neurological:     General: No focal deficit present.     Mental Status: He is alert and oriented to person, place, and time.    Review of Systems  Constitutional: Negative.   Respiratory: Negative.    Cardiovascular: Negative.   Gastrointestinal: Negative.   Genitourinary: Negative.    Blood pressure 119/88, pulse 66, temperature 98.7 F (37.1 C), temperature source Oral, resp. rate 18, height _0  (1.727 m), weight 191 lb (86.6 kg), SpO2 100 %. Body mass index is 29.04 kg/m.  Demographic Factors:  Male, Divorced or widowed, and Living alone  Loss Factors: Loss of significant relationship  Historical Factors: Prior suicide attempts and Family history of suicide  Risk Reduction Factors:   Positive therapeutic relationship and Positive coping skills or problem solving skills  Continued Clinical Symptoms:  Alcohol/Substance Abuse/Dependencies Previous Psychiatric Diagnoses and Treatments  Cognitive Features That Contribute To Risk:  None    Suicide Risk:  Minimal: No identifiable suicidal ideation.  Patients presenting with no risk factors but with morbid ruminations; may be classified as minimal risk based on the severity of the depressive symptoms  Plan Of Care/Follow-up  recommendations:  Assessment and Rationale for Discharge: Patient does not meet criteria for continued [place] observation.  The patient does not currently take any benzodiazepines or medications that might falsely test positively for benzodiazepines.  His positive benzodiazepine finding in the urine is most likely from reported use of cocaine on 12/24/21, which was possibly laced with benzodiazepines as these substances are unregulated and often not in their pure forms.  Patient would like to continue his sobriety by joining the 28-day program at [facility], and based on my evaluation of him during his admission at the [facility], he would greatly benefit from this program and would be a stellar candidate.  Activity:  as tolerated Diet:  regular  Disposition: d/c to home with referral to [facility] on 7/27 at 7:45 am  I discussed my assessment and planned treatment for the patient with Dr. Dwyane Dee who agrees with my formulated course of action.  Camelia Phenes, MD 01/21/2022, 10:23 AM

## 2022-01-21 NOTE — ED Notes (Signed)
Patient discharge home with community resources for outpatient substance treatment. Patient verbalized understanding of instructions. Patient received all belongings from [facility] locker as well as a bus pass for transportation. At time of discharge patient denies SI, HI and AVH.

## 2022-01-26 ENCOUNTER — Other Ambulatory Visit: Payer: Self-pay | Admitting: Nurse Practitioner

## 2022-02-03 ENCOUNTER — Other Ambulatory Visit: Payer: Self-pay | Admitting: Internal Medicine

## 2022-02-14 ENCOUNTER — Other Ambulatory Visit: Payer: Self-pay | Admitting: Internal Medicine

## 2022-02-17 ENCOUNTER — Encounter (HOSPITAL_COMMUNITY): Payer: Medicaid Other | Admitting: Psychiatry

## 2022-02-20 ENCOUNTER — Other Ambulatory Visit: Payer: Self-pay | Admitting: Internal Medicine

## 2022-02-24 ENCOUNTER — Encounter (HOSPITAL_COMMUNITY): Payer: Self-pay | Admitting: Psychiatry

## 2022-02-24 ENCOUNTER — Ambulatory Visit (INDEPENDENT_AMBULATORY_CARE_PROVIDER_SITE_OTHER): Payer: Medicaid Other | Admitting: Psychiatry

## 2022-02-24 VITALS — BP 120/88 | HR 74 | Ht 68.0 in | Wt 184.0 lb

## 2022-02-24 DIAGNOSIS — F1021 Alcohol dependence, in remission: Secondary | ICD-10-CM | POA: Diagnosis not present

## 2022-02-24 DIAGNOSIS — F411 Generalized anxiety disorder: Secondary | ICD-10-CM | POA: Diagnosis not present

## 2022-02-24 NOTE — Progress Notes (Signed)
BH MD/PA/NP OP Progress Note     02/24/2022 1:36 PM [name]  MRN:  [id number]  Chief Complaint: "The depression has gone away, but I think I have some issues with anxiety" Chief Complaint   Medication Management      HPI: 61 year old male seen today for follow up psychiatric evaluation.  He has a psychiatric history of depression, anxiety, alcohol dependence (in remission 2 months), and alcohol induced mood disorder.  He is currently managed hydroxyzine 25 mg 3 times daily as needed.  He informed Probation officer that his medication is effective in managing his psychiatric conditions.  Today is well-groomed, pleasant, cooperative, and engaged in conversation.  He informed Probation officer he has been sober from alcohol for 2 months.  He notes that he attends AA meetings 3 days a week and SAIOP meetings twice weekly.  Patient informed writer that since being sober his anxiety and depression are well managed.  He notes that he takes hydroxyzine infrequently.  Today provider conducted a GAD-7 and patient scored a 2, at his last visit he scored a 13.  Provider also conducted PHQ-9 and patient scored a 2, at his last visit he scored a 14.  He endorses adequate sleep and appetite.  Today he denies SI/HI/VAH, mania, paranoia.    Patient informed Probation officer that he is in pain most days.  He notes that he has arthritis, degenerative disc disease, and pain in his neck.  He notes that he stretches every morning, exercises daily, and rides his bike daily.  He informed Probation officer that these methods are effective in managing his pain.  At this time patient does not require refills of medications.  He will follow-up in 3 months for further evaluation.  No other concerns at this time.   Visit Diagnosis:    ICD-10-CM   1. Generalized anxiety disorder  F41.1     2. 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   Past Medical History:  Past Medical History:  Diagnosis Date   Alcohol dependence ([place])    Allergies    Arthritis    Chest pain    Chronic lower back pain    Chronic pain of right wrist    Coronary artery disease    a. Multiple prior caths/PCI. Cath 2013 with possible spasm of RCA, 70% ISR of mid LCx with subsequent DES to mLCx and prox LCX. b. H/o microvascular angina. c. Recurrent angina 08/2014 - s/p PTCA/DES to prox Cx, PTCA/CBA to OM1.  c. LHC 06/10/15 with patent stents and some ISR in LCX and OM-1 that was not flow limiting --> Rx    Dyslipidemia    a. Intolerant to many statins except tolerating Livalo.   GERD (gastroesophageal reflux disease)    H/O cardiac catheterization 10/25/2018   Heart attack ([place])    Hypertension    Myocardial infarction [facility]) ~ 2010   S/P angioplasty with stent, DES, to proximal and mid LCX 12/15/11 12/15/2011   S/P foot surgery, right 04/2021   Shoulder pain    Stroke ([place])    pt. reports had a stroke around time of MI 2010   Type II diabetes mellitus ([place])    Unstable angina [facility])     Past Surgical History:  Procedure Laterality Date   CARDIAC CATHETERIZATION  06/15/2002   LAD with prox 40% stenosis, norma L main, Cfx with 25% lesion, RCA  with long mid 25% stenosis (Dr. Vita Barley)   [place]  04/01/2010   normal L main, LAD wit mild stenosis, L Cfx with 70% in-stent restenosis, RCA with 70% in-stent restenosis, LVEF >60% (Dr. K. Mali Hilty) - cutting ballon arthrectomy to RCA & Cfx (Dr. Rockne Menghini)   CARDIAC CATHETERIZATION  08/25/2010   preserved global LV contractility; multivessel CAD, diffuse 90-95% in-stent restenosis in prox placed Cfx stent - cutting balloon arthrectomy in Cfx with multiple dilatations 90-95% to 0% stenosis (Dr. Corky Downs)   CARDIAC CATHETERIZATION  01/26/2011   PCI & stenting of aggresive in-stent restenosis within previously stented AV groove Cfx with 3.0x21m Taxus DES (previous stents were  Promus) (Dr. JAdora Fridge   CARDIAC CATHETERIZATION  05/11/2011   preserved LV function, 40% mid LAD stenosis, 30-40% narrowing proximal to stented semgnet of prox Cfx, patent mid RCA stent with smooth 20% narrowing in distal RCA (Dr. TCorky Downs   CARDIAC CATHETERIZATION  12/15/2011   PCI & stenting of proximal & mid Cfx with DES - 3.0x18min proximal, 3.0x1556mn mid (Dr. J. Adora Fridge CARChelseaA 06/10/2015   Procedure: Left Heart Cath and Coronary Angiography;  Surgeon: DanJolaine ArtistD;  Location: [facility] [place] [facility];  Service: Cardiovascular;  Laterality: N/A;   CARDIAC CATHETERIZATION  04/85/27/7824cardiometabolic testing  08/10/33/3614good exercise effort, peak VO2 79% predicted with normal VO2 HR curves (mild deconditioning)   COLONOSCOPY  12/2012   diminutive hyperplastic sigmoid poyp so repeat routine 2024   CORONARY BALLOON ANGIOPLASTY N/A 10/25/2018   Procedure: CORONARY BALLOON ANGIOPLASTY;  Surgeon: VarJettie BoozeD;  Location: [facility] [place] [facility];  Service: Cardiovascular;  Laterality: N/A;   CORONARY BALLOON ANGIOPLASTY N/A 09/29/2019   Procedure: CORONARY BALLOON ANGIOPLASTY;  Surgeon: EndNelva BushD;  Location: [facility] [place] [facility];  Service: Cardiovascular;  Laterality: N/A;   EXCISIONAL HEMORRHOIDECTOMY  1984   INTRAVASCULAR ULTRASOUND/IVUS N/A 09/29/2019   Procedure: Intravascular Ultrasound/IVUS;  Surgeon: EndNelva BushD;  Location: [facility] [place] [facility];  Service: Cardiovascular;  Laterality: N/A;   LEFT HEART CATH AND CORONARY ANGIOGRAPHY N/A 10/25/2018   Procedure: LEFT HEART CATH AND CORONARY ANGIOGRAPHY;  Surgeon: VarJettie BoozeD;  Location: [facility] [place] [facility];  Service: Cardiovascular;  Laterality: N/A;   LEFT HEART CATH AND CORONARY ANGIOGRAPHY N/A 09/29/2019   Procedure: LEFT HEART CATH AND CORONARY ANGIOGRAPHY;  Surgeon: EndNelva BushD;  Location: [facility] [place] [facility];  Service: Cardiovascular;  Laterality: N/A;   LEFT HEART  CATHETERIZATION WITH CORONARY ANGIOGRAM N/A 05/11/2011   Procedure: LEFT HEART CATHETERIZATION WITH CORONARY ANGIOGRAM;  Surgeon: ThoTroy SineD;  [facility];  Service: Cardiovascular;  Laterality: N/A;  Possible percutaneous coronary intervention, possible IVUS   LEFT HEART CATHETERIZATION WITH CORONARY ANGIOGRAM N/A 12/15/2011   Procedure: LEFT HEART CATHETERIZATION WITH CORONARY ANGIOGRAM;  Surgeon: JonLorretta HarpD;  [facility];  Service: Cardiovascular;  Laterality: N/A;   LEFT HEART CATHETERIZATION WITH CORONARY ANGIOGRAM N/A 09/05/2014   Procedure: LEFT HEART CATHETERIZATION WITH CORONARY ANGIOGRAM;  Surgeon: ChrBurnell BlanksD;  [facility];  Service: Cardiovascular;  Laterality: N/A;   LIPOMA EXCISION     back of the head   NM MYOCAR PERF WALL MOTION  02/2012   lexiscan myoview; mild perfusion defect in mid inferolateral & basal inferolateral region (infarct/scar); EF 52%, abnormal but ow risk scan   PERCUTANEOUS CORONARY STENT INTERVENTION (PCI-S)  09/05/2014   Procedure: PERCUTANEOUS CORONARY STENT INTERVENTION (PCI-S);  Surgeon: Burnell Blanks, MD;  [facility];  Service: Cardiovascular;;   TONSILLECTOMY      Family Psychiatric History: Maternal grandfather alcohol use, maternal and paternal uncles and aunts alcohol use, oldest brother/sister alcohol and illegal substance.     Family History:  Family History  Problem Relation Age of Onset   Leukemia Mother    Prostate cancer Father    Cancer Brother    Coronary artery disease Paternal Grandmother    Cancer Paternal Grandfather    Migraines Neg Hx    Headache Neg Hx     Social History:  Social History   Socioeconomic History   Marital status: Widowed    Spouse name: Not on file   Number of children: 2   Years of education: GED   Highest education level: Not on file  Occupational History   Not on file  Tobacco Use   Smoking status: Former    Packs/day:  1.00    Years: 10.00    Total pack years: 10.00    Types: Cigarettes    Quit date: 10/07/2018    Years since quitting: 3.3   Smokeless tobacco: Never  Vaping Use   Vaping Use: Never used  Substance and Sexual Activity   Alcohol use: Yes    Comment: 2 bottles of wine on 12/08/2019 (one occurrence)   Drug use: Not Currently    Types: Cocaine    Comment: last use several months ago   Sexual activity: Not Currently  Other Topics Concern   Not on file  Social History Narrative   ** Merged History Encounter **    Pt states he only smoke when he drinks   Social Determinants of Health   Financial Resource Strain: Low Risk  (12/11/2021)   Overall Financial Resource Strain (CARDIA)    Difficulty of Paying Living Expenses: Not very hard  Food Insecurity: Not on file  Transportation Needs: No Transportation Needs (12/11/2021)   PRAPARE - Hydrologist (Medical): No    Lack of Transportation (Non-Medical): No  Physical Activity: Not on file  Stress: Not on file  Social Connections: Not on file    Allergies:  Allergies  Allergen Reactions   Bee Venom Anaphylaxis, Hives, Itching and Other (See Comments)    Red eyes   Gadolinium Hives, Itching, Swelling and Other (See Comments)    Swelling of eyes after receiving MR contrast. 13 hr prep recommended. Pt developed large hive and swelling under right eye after contrast, given 64m iv benadryl, pt will need premed before gadolinium//lh   Shellfish Allergy Anaphylaxis, Hives, Itching and Other (See Comments)    Red eyes   Statins Other (See Comments)    Myalgias. Tolerating livalo. Pain    Testosterone Cypionate Other (See Comments)    Testerone Injection --Increased breast tissue     Metabolic Disorder Labs: Lab Results  Component Value Date   HGBA1C 7.0 (H) 01/20/2022   MPG 154.2 01/20/2022   MPG 136.98 03/16/2020   Lab Results  Component Value Date   PROLACTIN 13.0 03/17/2020   Lab Results   Component Value Date   CHOL 122 01/20/2022   TRIG 150 (H) 01/20/2022   HDL 36 (L) 01/20/2022   CHOLHDL 3.4 01/20/2022   VLDL 30 01/20/2022   LDLCALC 56 01/20/2022   LDLCALC 67 12/05/2021   Lab Results  Component Value Date   TSH 1.184 01/20/2022  TSH 3.372 07/07/2021    Therapeutic Level Labs: No results found for: "LITHIUM" No results found for: "VALPROATE" No results found for: "CBMZ"  Current Medications: Current Outpatient Medications  Medication Sig Dispense Refill   Accu-Chek Softclix Lancets lancets SMARTSIG:1 Topical 4 Times Daily     amLODipine (NORVASC) 10 MG tablet Take 1 tablet by mouth once daily 90 tablet 3   aspirin 81 MG EC tablet Take 1 tablet (81 mg total) by mouth daily. (May buy from over the counter): Swallow whole for heart health (Patient taking differently: Take 81 mg by mouth at bedtime. (May buy from over the counter): Swallow whole for heart health) 30 tablet 0   blood glucose meter kit and supplies KIT Dispense based on patient and insurance preference. Use up to four times daily as directed. (FOR ICD-9 250.00, 250.01). 1 each 0   Blood Pressure Monitor KIT 1 kit by Does not apply route daily. 1 kit 0   clopidogrel (PLAVIX) 75 MG tablet Take 1 tablet by mouth once daily 90 tablet 3   cyclobenzaprine (FLEXERIL) 10 MG tablet Take 10 mg by mouth 3 (three) times daily as needed for muscle spasms.     EPINEPHrine 0.3 mg/0.3 mL IJ SOAJ injection Inject 0.3 mg into the muscle as needed for anaphylaxis. 1 each 2   fluticasone (FLONASE) 50 MCG/ACT nasal spray Place 2 sprays into both nostrils daily. (Patient taking differently: Place 2 sprays into both nostrils daily as needed for allergies.) 16 g 6   glucose blood (ACCU-CHEK GUIDE) test strip Use as instructed 300 each 3   hydrOXYzine (ATARAX) 25 MG tablet Take 1 tablet (25 mg total) by mouth 3 (three) times daily as needed for anxiety. 90 tablet 3   LIVALO 4 MG TABS Take 1 tablet by mouth once daily 90 tablet  3   metFORMIN (GLUCOPHAGE) 1000 MG tablet Take 1 tablet (1,000 mg total) by mouth 2 (two) times daily with a meal. 180 tablet 3   metoprolol tartrate (LOPRESSOR) 100 MG tablet Take 1 tablet (100 mg total) by mouth 2 (two) times daily. 180 tablet 3   nitroGLYCERIN (NITROSTAT) 0.4 MG SL tablet Place 1 tablet (0.4 mg total) under the tongue every 5 (five) minutes x 3 doses as needed for chest pain. 25 tablet 3   Omega-3 Fatty Acids (FISH OIL PO) Take 1 capsule by mouth daily.     Polyethyl Glycol-Propyl Glycol (SYSTANE) 0.4-0.3 % GEL ophthalmic gel Place 1 Application into both eyes daily as needed (For dry eyes).     sildenafil (VIAGRA) 100 MG tablet Take 1 tablet (100 mg total) by mouth daily as needed. (Patient taking differently: Take 100 mg by mouth daily as needed for erectile dysfunction.) 10 tablet 5   Testosterone (ANDROGEL PUMP) 20.25 MG/ACT (1.62%) GEL Place 1 Pump onto the skin daily. 75 g 2   ezetimibe (ZETIA) 10 MG tablet Take 1 tablet (10 mg total) by mouth daily. 90 tablet 3   No current facility-administered medications for this visit.     Musculoskeletal: Strength & Muscle Tone: within normal limits, telehealth visit Gait & Station: normal,  Patient leans: N/A  Psychiatric Specialty Exam: Review of Systems  Blood pressure 120/88, pulse 74, height _0  (1.727 m), weight 184 lb (83.5 kg).Body mass index is 27.98 kg/m.  General Appearance: Well Groomed  Eye Contact:  Good  Speech:  Clear and Coherent and Normal Rate  Volume:  Normal  Mood:  Euthymic  Affect:  Appropriate and  Congruent  Thought Process:  Coherent, Goal Directed, and Linear  Orientation:  Full (Time, Place, and Person)  Thought Content: WDL and Logical   Suicidal Thoughts:  No  Homicidal Thoughts:  No  Memory:  Immediate;   Good Recent;   Good Remote;   Good  Judgement:  Good  Insight:  Good  Psychomotor Activity:  Normal  Concentration:  Concentration: Good and Attention Span: Good  Recall:  Good   Fund of Knowledge: Good  Language: Good  Akathisia:  No  Handed:  Right  AIMS (if indicated): not done  Assets:  Communication Skills Desire for Improvement Financial Resources/Insurance Housing Social Support  ADL's:  Intact  Cognition: WNL  Sleep:  Good   Screenings: AIMS    Flowsheet Row Admission (Discharged) from OP Visit from 09/07/2019 in [place] [facility] Admission (Discharged) from OP Visit from 01/12/2019 in [place] [facility] Admission (Discharged) from OP Visit from 08/15/2018 in [place] [facility] Admission (Discharged) from OP Visit from 10/02/2017 in [place] [facility] Admission (Discharged) from 06/22/2017 in [place] [facility]  AIMS Total Score 0 0 0 0 0      AUDIT    Flowsheet Row Office Visit from 08/27/2020 in [facility] Admission (Discharged) from OP Visit from 03/18/2020 in [place] [facility] Admission (Discharged) from 12/10/2019 in [place] [facility] Admission (Discharged) from OP Visit from 09/07/2019 in [place] [facility] Admission (Discharged) from OP Visit from 01/12/2019 in [place] [facility]  Alcohol Use Disorder Identification Test Final Score (AUDIT) _0 0      GAD-7    Flowsheet Row Clinical Support from 02/24/2022 in [facility] Office Visit from 01/08/2022 in [place] Video Visit from 11/19/2021 in [place] from 07/07/2021 in [facility] Office Visit from 08/27/2020 in [facility]  Total GAD-7 Score _1 PHQ2-9    Flowsheet Row Clinical Support from 02/24/2022 in [facility] Office Visit  from 01/08/2022 in [place] Video Visit from 11/19/2021 in [facility] Office Visit from 08/04/2021 in [place] from 07/07/2021 in [facility]  PHQ-2 Total Score 0 4 1 0 5  PHQ-9 Total Score _2 -- 22      Flowsheet Row ED from 01/20/2022 in [facility] ED from 10/10/2021 in [facility] ED from 07/07/2021 in [place] No Risk No Risk Low Risk        Assessment and Plan: Patient reports that he has been sober from alcohol for 2 months.  Since being sober he notes that his anxiety and depression are well managed.  At this time he does not need a refill of hydroxyzine.  He will follow-up with AA and SAIOP.  1. Generalized anxiety disorder   2. Moderate alcohol dependence in early remission [facility])    Follow-up in 31-monthFollow-up with therapy    BSalley Slaughter NP 02/24/2022, 1:36 PM

## 2022-03-06 ENCOUNTER — Encounter: Payer: Self-pay | Admitting: Nurse Practitioner

## 2022-03-06 ENCOUNTER — Ambulatory Visit: Payer: Self-pay | Admitting: Nurse Practitioner

## 2022-03-06 ENCOUNTER — Ambulatory Visit (INDEPENDENT_AMBULATORY_CARE_PROVIDER_SITE_OTHER): Payer: Medicaid Other | Admitting: Nurse Practitioner

## 2022-03-06 ENCOUNTER — Telehealth: Payer: Self-pay

## 2022-03-06 VITALS — BP 140/105 | HR 60 | Temp 97.3°F | Ht 68.0 in | Wt 186.2 lb

## 2022-03-06 DIAGNOSIS — N529 Male erectile dysfunction, unspecified: Secondary | ICD-10-CM

## 2022-03-06 DIAGNOSIS — R5383 Other fatigue: Secondary | ICD-10-CM | POA: Diagnosis not present

## 2022-03-06 DIAGNOSIS — E559 Vitamin D deficiency, unspecified: Secondary | ICD-10-CM | POA: Diagnosis not present

## 2022-03-06 DIAGNOSIS — E785 Hyperlipidemia, unspecified: Secondary | ICD-10-CM

## 2022-03-06 MED ORDER — SILDENAFIL CITRATE 100 MG PO TABS: 100.0000 mg | ORAL_TABLET | Freq: Every day | ORAL | 5 refills | Status: DC | PRN

## 2022-03-06 MED ORDER — EZETIMIBE 10 MG PO TABS: 10.0000 mg | ORAL_TABLET | Freq: Every day | ORAL | 2 refills | Status: DC

## 2022-03-06 NOTE — Progress Notes (Signed)
@Patient  ID: [name], male    DOB: 01/18/1961, 61 y.o.   MRN: [id number]  Chief Complaint  Patient presents with   Diabetes    Pt is here for 3 month DM follow up visit. Pt would like his testosterone checked. Pt is requesting refill on all medications     Referring provider: Fenton Foy, NP   HPI  [name] presents for follow up. She  has a past medical history of Alcohol dependence ([place]), Allergies, Arthritis, Chest pain, Chronic lower back pain, Chronic pain of right wrist, Coronary artery disease, Dyslipidemia, GERD (gastroesophageal reflux disease), H/O cardiac catheterization (10/25/2018), Heart attack ([place]), Hypertension, Myocardial infarction ([place]) (~ 2010), S/P angioplasty with stent, DES, to proximal and mid LCX 12/15/11 (12/15/2011), S/P foot surgery, right (04/2021), Shoulder pain, Stroke ([place]), Type II diabetes mellitus ([place]), and Unstable angina ([place]).     Diabetes:  [name] is in today for diabetes follow up. The prescribed treatment is metformin 500 mg  along with therapy zetia and livalo. The reported use of treatment is consistent with prescribed. He denies reported side effects from the treatment. Home glucose monitoring indicates a CBG normal range. His goal is to consisently eating healthy. He continues to have the fatigue. He states that he has not been doing well with diet or exercise. There has been a professional eye exam in the year. Closely followed podiatry.     He reports that he has note taken his medication. He continues to take metoprolol 100 mg BID. He does not monitor BP or HR at home. Denies f/c/s, n/v/d, hemoptysis, PND, leg swelling Denies chest pain or edema    Note: Cardiology - refilling htn medications   Would like testosterone checked today. States that he has low energy lately.  Patient has recently been to alcohol treatment center / rehab   Allergies  Allergen Reactions   Bee Venom Anaphylaxis, Hives, Itching and Other (See  Comments)    Red eyes   Gadolinium Hives, Itching, Swelling and Other (See Comments)    Swelling of eyes after receiving MR contrast. 13 hr prep recommended. Pt developed large hive and swelling under right eye after contrast, given 52m iv benadryl, pt will need premed before gadolinium//lh   Shellfish Allergy Anaphylaxis, Hives, Itching and Other (See Comments)    Red eyes   Statins Other (See Comments)    Myalgias. Tolerating livalo. Pain    Testosterone Cypionate Other (See Comments)    Testerone Injection --Increased breast tissue     Immunization History  Administered Date(s) Administered   Influenza,inj,Quad PF,6+ Mos 07/28/2017, 04/01/2018, 03/05/2021   MODERNA COVID-19 SARS-COV-2 PEDS BIVALENT BOOSTER 6Y-11Y 12/09/2019, 01/10/2020   Moderna SARS-COV2 Booster Vaccination 08/13/2020   Pneumococcal Polysaccharide-23 07/28/2017, 03/05/2021   Pneumococcal-Unspecified 06/29/2009   Tdap 02/08/2020   Zoster Recombinat (Shingrix) 01/11/2013    Past Medical History:  Diagnosis Date   Alcohol dependence ([place]    Allergies    Arthritis    Chest pain    Chronic lower back pain    Chronic pain of right wrist    Coronary artery disease    a. Multiple prior caths/PCI. Cath 2013 with possible spasm of RCA, 70% ISR of mid LCx with subsequent DES to mLCx and prox LCX. b. H/o microvascular angina. c. Recurrent angina 08/2014 - s/p PTCA/DES to prox Cx, PTCA/CBA to OM1.  c. LHC 06/10/15 with patent stents and some ISR in LCX and OM-1 that was not flow limiting --> Rx  Dyslipidemia    a. Intolerant to many statins except tolerating Livalo.   GERD (gastroesophageal reflux disease)    H/O cardiac catheterization 10/25/2018   Heart attack [facility])    Hypertension    Myocardial infarction [facility]) ~ 2010   S/P angioplasty with stent, DES, to proximal and mid LCX 12/15/11 12/15/2011   S/P foot surgery, right 04/2021   Shoulder pain    Stroke ([place])    pt. reports had a stroke around time of MI 2010    Type II diabetes mellitus ([place])    Unstable angina ([name])     Tobacco History: Social History   Tobacco Use  Smoking Status Former   Packs/day: 1.00   Years: 10.00   Total pack years: 10.00   Types: Cigarettes   Quit date: 10/07/2018   Years since quitting: 3.4  Smokeless Tobacco Never   Counseling given: Not Answered   Outpatient Encounter Medications as of 03/06/2022  Medication Sig   Accu-Chek Softclix Lancets lancets SMARTSIG:1 Topical 4 Times Daily   amLODipine (NORVASC) 10 MG tablet Take 1 tablet by mouth once daily   aspirin 81 MG EC tablet Take 1 tablet (81 mg total) by mouth daily. (May buy from over the counter): Swallow whole for heart health (Patient taking differently: Take 81 mg by mouth at bedtime. (May buy from over the counter): Swallow whole for heart health)   blood glucose meter kit and supplies KIT Dispense based on patient and insurance preference. Use up to four times daily as directed. (FOR ICD-9 250.00, 250.01).   clopidogrel (PLAVIX) 75 MG tablet Take 1 tablet by mouth once daily   EPINEPHrine 0.3 mg/0.3 mL IJ SOAJ injection Inject 0.3 mg into the muscle as needed for anaphylaxis.   glucose blood (ACCU-CHEK GUIDE) test strip Use as instructed   hydrOXYzine (ATARAX) 25 MG tablet Take 1 tablet (25 mg total) by mouth 3 (three) times daily as needed for anxiety.   LIVALO 4 MG TABS Take 1 tablet by mouth once daily   metFORMIN (GLUCOPHAGE) 1000 MG tablet Take 1 tablet (1,000 mg total) by mouth 2 (two) times daily with a meal.   metoprolol tartrate (LOPRESSOR) 100 MG tablet Take 1 tablet (100 mg total) by mouth 2 (two) times daily.   nitroGLYCERIN (NITROSTAT) 0.4 MG SL tablet Place 1 tablet (0.4 mg total) under the tongue every 5 (five) minutes x 3 doses as needed for chest pain.   Testosterone (ANDROGEL PUMP) 20.25 MG/ACT (1.62%) GEL Place 1 Pump onto the skin daily.   [DISCONTINUED] sildenafil (VIAGRA) 100 MG tablet Take 1 tablet (100 mg total) by mouth daily  as needed. (Patient taking differently: Take 100 mg by mouth daily as needed for erectile dysfunction.)   Blood Pressure Monitor KIT 1 kit by Does not apply route daily. (Patient not taking: Reported on 03/06/2022)   cyclobenzaprine (FLEXERIL) 10 MG tablet Take 10 mg by mouth 3 (three) times daily as needed for muscle spasms. (Patient not taking: Reported on 03/06/2022)   ezetimibe (ZETIA) 10 MG tablet Take 1 tablet (10 mg total) by mouth daily.   Omega-3 Fatty Acids (FISH OIL PO) Take 1 capsule by mouth daily. (Patient not taking: Reported on 03/06/2022)   Polyethyl Glycol-Propyl Glycol (SYSTANE) 0.4-0.3 % GEL ophthalmic gel Place 1 Application into both eyes daily as needed (For dry eyes). (Patient not taking: Reported on 03/06/2022)   sildenafil (VIAGRA) 100 MG tablet Take 1 tablet (100 mg total) by mouth daily as needed.   [DISCONTINUED]  ezetimibe (ZETIA) 10 MG tablet Take 1 tablet (10 mg total) by mouth daily.   [DISCONTINUED] fluticasone (FLONASE) 50 MCG/ACT nasal spray Place 2 sprays into both nostrils daily. (Patient taking differently: Place 2 sprays into both nostrils daily as needed for allergies.)   No facility-administered encounter medications on file as of 03/06/2022.     Review of Systems  Review of Systems  Constitutional:  Positive for fatigue.  HENT: Negative.    Cardiovascular: Negative.   Gastrointestinal: Negative.   Allergic/Immunologic: Negative.   Neurological: Negative.   Psychiatric/Behavioral: Negative.         Physical Exam  BP (!) 140/105   Pulse 60   Temp (!) 97.3 F (36.3 C)   Ht 5' 8"  (1.727 m)   Wt 186 lb 4 oz (84.5 kg)   SpO2 100%   BMI 28.32 kg/m   Wt Readings from Last 5 Encounters:  03/06/22 186 lb 4 oz (84.5 kg)  01/08/22 191 lb 6.4 oz (86.8 kg)  12/10/21 189 lb 3.2 oz (85.8 kg)  12/04/21 188 lb 12.8 oz (85.6 kg)  08/04/21 192 lb (87.1 kg)     Physical Exam Vitals and nursing note reviewed.  Constitutional:      General: He is not in  acute distress.    Appearance: He is well-developed.  Cardiovascular:     Rate and Rhythm: Normal rate and regular rhythm.  Pulmonary:     Effort: Pulmonary effort is normal.     Breath sounds: Normal breath sounds.  Skin:    General: Skin is warm and dry.  Neurological:     Mental Status: He is alert and oriented to person, place, and time.      Assessment & Plan:   Low energy - Testosterone - Vitamin D, 25-hydroxy - Vitamin B12  2. Vitamin D deficiency  - Vitamin D, 25-hydroxy  3. Erectile dysfunction, unspecified erectile dysfunction type  - sildenafil (VIAGRA) 100 MG tablet; Take 1 tablet (100 mg total) by mouth daily as needed.  Dispense: 10 tablet; Refill: 5  4. Hyperlipidemia, unspecified hyperlipidemia type  - ezetimibe (ZETIA) 10 MG tablet; Take 1 tablet (10 mg total) by mouth daily.  Dispense: 30 tablet; Refill: 2    Follow up:  Follow up in 3 months or sooner if needed     Fenton Foy, NP 03/06/2022

## 2022-03-06 NOTE — Telephone Encounter (Signed)
Restart Wellbutrin

## 2022-03-06 NOTE — Assessment & Plan Note (Signed)
-   Testosterone - Vitamin D, 25-hydroxy - Vitamin B12  2. Vitamin D deficiency  - Vitamin D, 25-hydroxy  3. Erectile dysfunction, unspecified erectile dysfunction type  - sildenafil (VIAGRA) 100 MG tablet; Take 1 tablet (100 mg total) by mouth daily as needed.  Dispense: 10 tablet; Refill: 5  4. Hyperlipidemia, unspecified hyperlipidemia type  - ezetimibe (ZETIA) 10 MG tablet; Take 1 tablet (10 mg total) by mouth daily.  Dispense: 30 tablet; Refill: 2    Follow up:  Follow up in 3 months or sooner if needed

## 2022-03-06 NOTE — Patient Instructions (Signed)
1. Low energy  - Testosterone - Vitamin D, 25-hydroxy - Vitamin B12  2. Vitamin D deficiency  - Vitamin D, 25-hydroxy  3. Erectile dysfunction, unspecified erectile dysfunction type  - sildenafil (VIAGRA) 100 MG tablet; Take 1 tablet (100 mg total) by mouth daily as needed.  Dispense: 10 tablet; Refill: 5  4. Hyperlipidemia, unspecified hyperlipidemia type  - ezetimibe (ZETIA) 10 MG tablet; Take 1 tablet (10 mg total) by mouth daily.  Dispense: 30 tablet; Refill: 2    Follow up:  Follow up in 3 months or sooner if needed

## 2022-03-07 LAB — TESTOSTERONE: Testosterone: 261 ng/dL — ABNORMAL LOW (ref 264–916)

## 2022-03-07 LAB — VITAMIN B12: Vitamin B-12: 543 pg/mL (ref 232–1245)

## 2022-03-07 LAB — VITAMIN D 25 HYDROXY (VIT D DEFICIENCY, FRACTURES): Vit D, 25-Hydroxy: 40.1 ng/mL (ref 30.0–100.0)

## 2022-03-09 ENCOUNTER — Other Ambulatory Visit: Payer: Self-pay | Admitting: Nurse Practitioner

## 2022-03-09 DIAGNOSIS — R7989 Other specified abnormal findings of blood chemistry: Secondary | ICD-10-CM

## 2022-03-30 ENCOUNTER — Other Ambulatory Visit: Payer: Self-pay | Admitting: Nurse Practitioner

## 2022-04-07 ENCOUNTER — Telehealth: Payer: Self-pay

## 2022-04-07 NOTE — Telephone Encounter (Signed)
[place] ° ° °

## 2022-04-08 ENCOUNTER — Other Ambulatory Visit: Payer: Self-pay

## 2022-04-08 MED ORDER — TESTOSTERONE 20.25 MG/ACT (1.62%) TD GEL: 1.0000 | Freq: Every day | TRANSDERMAL | 2 refills | Status: DC

## 2022-04-08 NOTE — Progress Notes (Signed)
PDMP reviewed. Androgel reordered.

## 2022-04-08 NOTE — Progress Notes (Signed)
I refilled this medication, but patient was referred to endocrinology for this. Please check on referral.

## 2022-04-24 ENCOUNTER — Other Ambulatory Visit: Payer: Self-pay | Admitting: Nurse Practitioner

## 2022-04-30 ENCOUNTER — Other Ambulatory Visit: Payer: Self-pay

## 2022-04-30 MED ORDER — ACCU-CHEK GUIDE VI STRP: ORAL_STRIP | 0 refills | Status: DC

## 2022-04-30 NOTE — Telephone Encounter (Signed)
90 day Test strips 3 X a day per request

## 2022-05-16 ENCOUNTER — Other Ambulatory Visit (HOSPITAL_COMMUNITY)
Admission: EM | Admit: 2022-05-16 | Discharge: 2022-05-20 | Disposition: A | Discharge status: Home / Self Care | Payer: Medicaid Other | Attending: Psychiatry | Admitting: Psychiatry

## 2022-05-16 DIAGNOSIS — F1020 Alcohol dependence, uncomplicated: Secondary | ICD-10-CM | POA: Diagnosis present

## 2022-05-16 DIAGNOSIS — Z1152 Encounter for screening for COVID-19: Secondary | ICD-10-CM | POA: Insufficient documentation

## 2022-05-16 DIAGNOSIS — Z7984 Long term (current) use of oral hypoglycemic drugs: Secondary | ICD-10-CM | POA: Insufficient documentation

## 2022-05-16 DIAGNOSIS — I10 Essential (primary) hypertension: Secondary | ICD-10-CM | POA: Diagnosis not present

## 2022-05-16 DIAGNOSIS — I252 Old myocardial infarction: Secondary | ICD-10-CM | POA: Insufficient documentation

## 2022-05-16 DIAGNOSIS — Z7902 Long term (current) use of antithrombotics/antiplatelets: Secondary | ICD-10-CM | POA: Insufficient documentation

## 2022-05-16 DIAGNOSIS — F411 Generalized anxiety disorder: Secondary | ICD-10-CM | POA: Insufficient documentation

## 2022-05-16 DIAGNOSIS — Z9151 Personal history of suicidal behavior: Secondary | ICD-10-CM | POA: Insufficient documentation

## 2022-05-16 DIAGNOSIS — E119 Type 2 diabetes mellitus without complications: Secondary | ICD-10-CM | POA: Insufficient documentation

## 2022-05-16 DIAGNOSIS — Z79899 Other long term (current) drug therapy: Secondary | ICD-10-CM | POA: Insufficient documentation

## 2022-05-16 DIAGNOSIS — Z7982 Long term (current) use of aspirin: Secondary | ICD-10-CM | POA: Insufficient documentation

## 2022-05-16 DIAGNOSIS — R632 Polyphagia: Secondary | ICD-10-CM | POA: Insufficient documentation

## 2022-05-16 DIAGNOSIS — I2510 Atherosclerotic heart disease of native coronary artery without angina pectoris: Secondary | ICD-10-CM | POA: Insufficient documentation

## 2022-05-16 DIAGNOSIS — Z79890 Hormone replacement therapy (postmenopausal): Secondary | ICD-10-CM | POA: Insufficient documentation

## 2022-05-16 DIAGNOSIS — E782 Mixed hyperlipidemia: Secondary | ICD-10-CM | POA: Insufficient documentation

## 2022-05-16 LAB — TSH: TSH: 1.742 u[IU]/mL (ref 0.350–4.500)

## 2022-05-16 LAB — CBC WITH DIFFERENTIAL/PLATELET
Abs Immature Granulocytes: 0.02 10*3/uL (ref 0.00–0.07)
Basophils Absolute: 0.1 10*3/uL (ref 0.0–0.1)
Basophils Relative: 1 %
Eosinophils Absolute: 0.6 10*3/uL — ABNORMAL HIGH (ref 0.0–0.5)
Eosinophils Relative: 8 %
HCT: 45.3 % (ref 39.0–52.0)
Hemoglobin: 15.4 g/dL (ref 13.0–17.0)
Immature Granulocytes: 0 %
Lymphocytes Relative: 33 %
Lymphs Abs: 2.3 10*3/uL (ref 0.7–4.0)
MCH: 28.7 pg (ref 26.0–34.0)
MCHC: 34 g/dL (ref 30.0–36.0)
MCV: 84.5 fL (ref 80.0–100.0)
Monocytes Absolute: 0.5 10*3/uL (ref 0.1–1.0)
Monocytes Relative: 7 %
Neutro Abs: 3.6 10*3/uL (ref 1.7–7.7)
Neutrophils Relative %: 51 %
Platelets: 262 10*3/uL (ref 150–400)
RBC: 5.36 MIL/uL (ref 4.22–5.81)
RDW: 13 % (ref 11.5–15.5)
WBC: 7.1 10*3/uL (ref 4.0–10.5)
nRBC: 0 % (ref 0.0–0.2)

## 2022-05-16 LAB — POCT URINE DRUG SCREEN - MANUAL ENTRY (I-SCREEN)
POC Amphetamine UR: NOT DETECTED
POC Buprenorphine (BUP): NOT DETECTED
POC Cocaine UR: NOT DETECTED
POC Marijuana UR: NOT DETECTED
POC Methadone UR: NOT DETECTED
POC Methamphetamine UR: NOT DETECTED
POC Morphine: NOT DETECTED
POC Oxazepam (BZO): NOT DETECTED
POC Oxycodone UR: NOT DETECTED
POC Secobarbital (BAR): NOT DETECTED

## 2022-05-16 LAB — COMPREHENSIVE METABOLIC PANEL
ALT: 42 U/L (ref 0–44)
AST: 30 U/L (ref 15–41)
Albumin: 4.5 g/dL (ref 3.5–5.0)
Alkaline Phosphatase: 61 U/L (ref 38–126)
Anion gap: 20 — ABNORMAL HIGH (ref 5–15)
BUN: 16 mg/dL (ref 6–20)
CO2: 20 mmol/L — ABNORMAL LOW (ref 22–32)
Calcium: 10.3 mg/dL (ref 8.9–10.3)
Chloride: 100 mmol/L (ref 98–111)
Creatinine, Ser: 0.92 mg/dL (ref 0.61–1.24)
GFR, Estimated: >60 mL/min (ref >60)
Glucose, Bld: 144 mg/dL — ABNORMAL HIGH (ref 70–99)
Potassium: 4.8 mmol/L (ref 3.5–5.1)
Sodium: 140 mmol/L (ref 135–145)
Total Bilirubin: 0.5 mg/dL (ref 0.3–1.2)
Total Protein: 6.8 g/dL (ref 6.5–8.1)

## 2022-05-16 LAB — LIPID PANEL
Cholesterol: 126 mg/dL (ref 0–200)
HDL: 45 mg/dL (ref >40)
LDL Cholesterol: 37 mg/dL (ref 0–99)
Total CHOL/HDL Ratio: 2.8 RATIO
Triglycerides: 219 mg/dL — ABNORMAL HIGH (ref <150)
VLDL: 44 mg/dL — ABNORMAL HIGH (ref 0–40)

## 2022-05-16 LAB — RESP PANEL BY RT-PCR (FLU A&B, COVID) ARPGX2
Influenza A by PCR: NEGATIVE
Influenza B by PCR: NEGATIVE
SARS Coronavirus 2 by RT PCR: NEGATIVE

## 2022-05-16 LAB — ETHANOL: Alcohol, Ethyl (B): 36 mg/dL — ABNORMAL HIGH (ref <10)

## 2022-05-16 LAB — POC SARS CORONAVIRUS 2 AG: SARSCOV2ONAVIRUS 2 AG: NEGATIVE

## 2022-05-16 MED ORDER — ONDANSETRON 4 MG PO TBDP: 4.0000 mg | ORAL_TABLET | Freq: Four times a day (QID) | ORAL | Status: AC | PRN

## 2022-05-16 MED ORDER — LORAZEPAM 1 MG PO TABS
1.0000 mg | ORAL_TABLET | Freq: Two times a day (BID) | ORAL | Status: AC
  Administered 2022-05-18 – 2022-05-19 (×2): 1 mg via ORAL
  Filled 2022-05-16 (×2): qty 1

## 2022-05-16 MED ORDER — ADULT MULTIVITAMIN W/MINERALS CH
1.0000 | ORAL_TABLET | Freq: Every day | ORAL | Status: DC
  Administered 2022-05-16 – 2022-05-20 (×5): 1 via ORAL
  Filled 2022-05-16 (×5): qty 1

## 2022-05-16 MED ORDER — LOPERAMIDE HCL 2 MG PO CAPS: 2.0000 mg | ORAL_CAPSULE | ORAL | Status: AC | PRN

## 2022-05-16 MED ORDER — NITROGLYCERIN 0.4 MG SL SUBL: 0.4000 mg | SUBLINGUAL_TABLET | SUBLINGUAL | Status: DC | PRN

## 2022-05-16 MED ORDER — LORAZEPAM 1 MG PO TABS
1.0000 mg | ORAL_TABLET | Freq: Every day | ORAL | Status: DC
  Filled 2022-05-16: qty 1

## 2022-05-16 MED ORDER — ASPIRIN 81 MG PO TBEC
81.0000 mg | DELAYED_RELEASE_TABLET | Freq: Every day | ORAL | Status: DC
  Administered 2022-05-16 – 2022-05-20 (×5): 81 mg via ORAL
  Filled 2022-05-16 (×5): qty 1

## 2022-05-16 MED ORDER — AMLODIPINE BESYLATE 10 MG PO TABS
10.0000 mg | ORAL_TABLET | Freq: Every day | ORAL | Status: DC
  Administered 2022-05-16 – 2022-05-20 (×5): 10 mg via ORAL
  Filled 2022-05-16 (×5): qty 1

## 2022-05-16 MED ORDER — THIAMINE MONONITRATE 100 MG PO TABS
100.0000 mg | ORAL_TABLET | Freq: Every day | ORAL | Status: DC
  Administered 2022-05-17 – 2022-05-20 (×4): 100 mg via ORAL
  Filled 2022-05-16 (×4): qty 1

## 2022-05-16 MED ORDER — LORAZEPAM 1 MG PO TABS: 1.0000 mg | ORAL_TABLET | Freq: Four times a day (QID) | ORAL | Status: AC | PRN

## 2022-05-16 MED ORDER — CLOPIDOGREL BISULFATE 75 MG PO TABS
75.0000 mg | ORAL_TABLET | Freq: Every day | ORAL | Status: DC
  Administered 2022-05-16 – 2022-05-20 (×5): 75 mg via ORAL
  Filled 2022-05-16 (×5): qty 1

## 2022-05-16 MED ORDER — LORAZEPAM 1 MG PO TABS
1.0000 mg | ORAL_TABLET | Freq: Four times a day (QID) | ORAL | Status: AC
  Administered 2022-05-16 – 2022-05-17 (×4): 1 mg via ORAL
  Filled 2022-05-16 (×4): qty 1

## 2022-05-16 MED ORDER — ACETAMINOPHEN 325 MG PO TABS
650.0000 mg | ORAL_TABLET | Freq: Four times a day (QID) | ORAL | Status: DC | PRN
  Administered 2022-05-17: 650 mg via ORAL
  Filled 2022-05-16: qty 2

## 2022-05-16 MED ORDER — MAGNESIUM HYDROXIDE 400 MG/5ML PO SUSP: 30.0000 mL | Freq: Every day | ORAL | Status: DC | PRN

## 2022-05-16 MED ORDER — THIAMINE HCL 100 MG/ML IJ SOLN
100.0000 mg | Freq: Once | INTRAMUSCULAR | Status: AC
  Administered 2022-05-16: 100 mg via INTRAMUSCULAR
  Filled 2022-05-16: qty 2

## 2022-05-16 MED ORDER — METFORMIN HCL 500 MG PO TABS
1000.0000 mg | ORAL_TABLET | Freq: Two times a day (BID) | ORAL | Status: DC
  Administered 2022-05-16 – 2022-05-20 (×8): 1000 mg via ORAL
  Filled 2022-05-16 (×8): qty 2

## 2022-05-16 MED ORDER — LORAZEPAM 1 MG PO TABS
1.0000 mg | ORAL_TABLET | Freq: Three times a day (TID) | ORAL | Status: AC
  Administered 2022-05-17 – 2022-05-18 (×3): 1 mg via ORAL
  Filled 2022-05-16 (×3): qty 1

## 2022-05-16 MED ORDER — HYDROXYZINE HCL 25 MG PO TABS: 25.0000 mg | ORAL_TABLET | Freq: Three times a day (TID) | ORAL | Status: DC | PRN

## 2022-05-16 MED ORDER — ALUM & MAG HYDROXIDE-SIMETH 200-200-20 MG/5ML PO SUSP: 30.0000 mL | ORAL | Status: DC | PRN

## 2022-05-16 MED ORDER — METOPROLOL TARTRATE 50 MG PO TABS
100.0000 mg | ORAL_TABLET | Freq: Two times a day (BID) | ORAL | Status: DC
  Administered 2022-05-16 – 2022-05-20 (×8): 100 mg via ORAL
  Filled 2022-05-16 (×8): qty 2

## 2022-05-16 NOTE — ED Notes (Signed)
Patient in dayroom - denies SI, HI and AVH - will continue to monitor for safety

## 2022-05-16 NOTE — ED Triage Notes (Signed)
Pt presents to [facility] voluntarily seeking alcohol detox and treatment. Pt states that he consumes about 1 pint of gin and 2,16oz beers daily. Pt reports drinking a pint of gin this morning. Pt has a scent of alcohol on his breath currently. Pt reports having [facility] for 6 days about 6 months ago. Pt denies SI/HI and AVH at this time.

## 2022-05-16 NOTE — ED Notes (Signed)
Patient brought on unit, cooperative with assessment, patient is here for substance abuse treatment, he denies SI/HI/AVH.

## 2022-05-16 NOTE — ED Notes (Signed)
Patient resting with no sxs of distress noted - will continue to monitor for safety

## 2022-05-16 NOTE — ED Provider Notes (Cosign Needed Addendum)
Facility Based Crisis Admission H&P  Date: 05/16/22 Patient Name: [name] MRN: [id number] Chief Complaint:  Chief Complaint  Patient presents with   Alcohol Problem      Diagnoses:  Final diagnoses:  Uncomplicated alcohol dependence ([place])  Type 2 diabetes mellitus without complication, without long-term current use of insulin ([place])  Essential hypertension    HPI: [name] is a 61 year old male patient with a past psychiatric history significant for alcohol dependence, MDD, GAD, and past suicide attempts who presented to the [facility] behavioral health urgent care voluntary seeking alcohol detox.  Patient seen and evaluated face to face by this provider, and chart reviewed. On evaluation, patient is alert and oriented x 4. His thought process is logical and goal oriented. His speech is clear and coherent at a moderate tone. He denies SI/HI/AVH. There is no objective evidence that the patient is currently responding to internal or external stimuli. His mood is euthymic and affect is congruent. He has fair eye contact. He is casually dressed. He is calm and cooperative. He does not appear to be in acute distress and is ambulatory.  Patient states that he is a binge drinker. He reports relapsing and binge drinking for the past 5 months. He states that he will drink all day and then be in the bed for the next 2 or 3 days and then start all over again the next day. He reports drinking on average a pint of liquor and 2 beers. His last drink was a pint of liquor morning. He reports drinking alcohol since age 52. He reports a sobriety of 3 months prior to relapsing. He also reports a sobriety of 4 years and 1 month 11 years ago. He currently denies alcohol withdrawal symptoms. No acute signs of alcohol withdrawa noted. He denies a history of alcohol withdrawal seizures or delirium tremens. He reports cocaine use and states that he rarely uses. He reports using cocaine last 2 or 3 days  ago. He states that prior to that he used cocaine 4 months ago. He reports using cocaine on and off since age 61.   He identifies trigger to relapsing as " I was diagnosed with generalized anxiety disorder." He states that he realized that the coffee, caffeine at that time was causing him to have lots of anxiety. He states that once he eliminated the coffee he was not able to stop drinking alcohol at that time. He state Today, he denies anxiety or depression. He reports poor fair sleep and states that he sleeps too much during the day. He reports a fair appetite and states that he overeats and snacks a lot during the day.   He denies outpatient psychiatry or therapy. He denies taking psychotropic medications. He states that he stopped taking the Wellbutrin because he felt good. He states that he does not need to be on mediations for depression or anxiety because he does not feel like he needs to be on medication for it.   He is a widower and lives alone. He is unemployed and on disability for a past heart attack. He reports a medical history of diabetes type 2, heart disease, hypertension,osteoarthritis, plantar fascia, high cholesterol, degenerative disc in back and neck. He states that he currently takes metformin 1000 mg twice daily, amlodipine 10 mg p.o. daily, metoprolol an unknown dose twice daily, aspirin 81 mg p.o. daily, Plavix 75 mg p.o. daily, and livalo a cholesterol medication daily. He denies physical complaints at this time.  He reports a family psychiatric history of father has a history of alcoholism, mother has a history of MDD, older brother and sister both have a history of alcoholism.  PHQ 2-9:  [place] Visit from 03/06/2022 in [place] from 02/24/2022 in [facility] Office Visit from 01/08/2022 in [facility]  Thoughts that you would be better off dead, or of hurting yourself in some  way Not at all Not at all Not at all  PHQ-9 Total Score 0 2 14       [facility] ED from 05/16/2022 in [facility] ED from 01/20/2022 in [facility] ED from 10/10/2021 in [facility] [place] No Risk No Risk No Risk        Total Time spent with patient: 45 minutes  Musculoskeletal  Strength & Muscle Tone: within normal limits Gait & Station: normal Patient leans: N/A  Psychiatric Specialty Exam  Presentation General Appearance:  Appropriate for Environment  Eye Contact: Fair  Speech: Clear and Coherent  Speech Volume: Normal  Handedness: -- (not assessed)   Mood and Affect  Mood: Euthymic  Affect: Congruent   Thought Process  Thought Processes: Goal Directed; Coherent  Descriptions of Associations:Intact  Orientation:Full (Time, Place and Person)  Thought Content:Logical    Hallucinations:Hallucinations: None  Ideas of Reference:None  Suicidal Thoughts:Suicidal Thoughts: No  Homicidal Thoughts:Homicidal Thoughts: No   Sensorium  Memory: Immediate Fair; Recent Fair; Remote Fair  Judgment: Fair  Insight: Fair   Community education officer  Concentration: Fair  Attention Span: Fair  Recall: [employer] of Knowledge: Fair  Language: Fair   Psychomotor Activity  Psychomotor Activity: Psychomotor Activity: Normal   Assets  Assets: Communication Skills; Desire for Improvement; Financial Resources/Insurance; Housing; Leisure Time; Physical Health   Sleep  Sleep: Sleep: Fair   Nutritional Assessment (For OBS and FBC admissions only) Has the patient had a weight loss or gain of 10 pounds or more in the last 3 months?: No Has the patient had a decrease in food intake/or appetite?: No Does the patient have dental problems?: No Does the patient have eating habits or behaviors that may be indicators of an eating disorder including  binging or inducing vomiting?: No Has the patient recently lost weight without trying?: 0 Has the patient been eating poorly because of a decreased appetite?: 0 Malnutrition Screening Tool Score: 0    Physical Exam HENT:     Head: Normocephalic.     Nose: Nose normal.  Eyes:     Conjunctiva/sclera: Conjunctivae normal.  Pulmonary:     Effort: Pulmonary effort is normal.  Musculoskeletal:        General: Normal range of motion.     Cervical back: Normal range of motion.  Neurological:     Mental Status: He is alert and oriented to person, place, and time.   Review of Systems  Constitutional: Negative.   HENT: Negative.    Eyes: Negative.   Respiratory: Negative.    Cardiovascular: Negative.   Gastrointestinal: Negative.   Genitourinary: Negative.   Musculoskeletal: Negative.   Neurological: Negative.   Endo/Heme/Allergies: Negative.     Blood pressure 118/87, pulse 85, temperature 97.8 F (36.6 C), temperature source Oral, resp. rate 18, SpO2 98 %. There is no height or weight on file to calculate BMI.  Past Psychiatric History: History of MDD, GAD and two past  suicide attempts  in 2000 by driving his car off the bridge and in 2005 by OD.   Is the patient at risk to self? No  Has the patient been a risk to self in the past 6 months? No .    Has the patient been a risk to self within the distant past? Yes   Is the patient a risk to others? No   Has the patient been a risk to others in the past 6 months? No   Has the patient been a risk to others within the distant past? No   Past Medical History:  Past Medical History:  Diagnosis Date   Alcohol dependence ([place])    Allergies    Arthritis    Chest pain    Chronic lower back pain    Chronic pain of right wrist    Coronary artery disease    a. Multiple prior caths/PCI. Cath 2013 with possible spasm of RCA, 70% ISR of mid LCx with subsequent DES to mLCx and prox LCX. b. H/o microvascular angina. c. Recurrent angina  08/2014 - s/p PTCA/DES to prox Cx, PTCA/CBA to OM1.  c. LHC 06/10/15 with patent stents and some ISR in LCX and OM-1 that was not flow limiting --> Rx    Dyslipidemia    a. Intolerant to many statins except tolerating Livalo.   GERD (gastroesophageal reflux disease)    H/O cardiac catheterization 10/25/2018   Heart attack [facility])    Hypertension    Myocardial infarction [facility]) ~ 2010   S/P angioplasty with stent, DES, to proximal and mid LCX 12/15/11 12/15/2011   S/P foot surgery, right 04/2021   Shoulder pain    Stroke ([place])    pt. reports had a stroke around time of MI 2010   Type II diabetes mellitus ([place])    Unstable angina [facility])     Past Surgical History:  Procedure Laterality Date   CARDIAC CATHETERIZATION  06/15/2002   LAD with prox 40% stenosis, norma L main, Cfx with 25% lesion, RCA with long mid 25% stenosis (Dr. Vita Barley)   CARDIAC CATHETERIZATION  04/01/2010   normal L main, LAD wit mild stenosis, L Cfx with 70% in-stent restenosis, RCA with 70% in-stent restenosis, LVEF >60% (Dr. K. Mali Hilty) - cutting ballon arthrectomy to RCA & Cfx (Dr. Rockne Menghini)   CARDIAC CATHETERIZATION  08/25/2010   preserved global LV contractility; multivessel CAD, diffuse 90-95% in-stent restenosis in prox placed Cfx stent - cutting balloon arthrectomy in Cfx with multiple dilatations 90-95% to 0% stenosis (Dr. Corky Downs)   CARDIAC CATHETERIZATION  01/26/2011   PCI & stenting of aggresive in-stent restenosis within previously stented AV groove Cfx with 3.0x65m Taxus DES (previous stents were Promus) (Dr. JAdora Fridge   CARDIAC CATHETERIZATION  05/11/2011   preserved LV function, 40% mid LAD stenosis, 30-40% narrowing proximal to stented semgnet of prox Cfx, patent mid RCA stent with smooth 20% narrowing in distal RCA (Dr. TCorky Downs   CARDIAC CATHETERIZATION  12/15/2011   PCI & stenting of proximal & mid Cfx with DES - 3.0x161min proximal, 3.0x155mn mid (Dr. J. Adora Fridge CARLampasasA  06/10/2015   Procedure: Left Heart Cath and Coronary Angiography;  Surgeon: DanJolaine ArtistD;  Location: [facility];  Service: Cardiovascular;  Laterality: N/A;   CARDIAC CATHETERIZATION  04/32/44/0102cardiometabolic testing  08/16/23/3664good exercise effort, peak VO2 79% predicted with normal VO2 HR curves (mild deconditioning)  COLONOSCOPY  12/2012   diminutive hyperplastic sigmoid poyp so repeat routine 2024   CORONARY BALLOON ANGIOPLASTY N/A 10/25/2018   Procedure: CORONARY BALLOON ANGIOPLASTY;  Surgeon: Jettie Booze, MD;  Location: [facility];  Service: Cardiovascular;  Laterality: N/A;   CORONARY BALLOON ANGIOPLASTY N/A 09/29/2019   Procedure: CORONARY BALLOON ANGIOPLASTY;  Surgeon: Nelva Bush, MD;  Location: [facility] [facility];  Service: Cardiovascular;  Laterality: N/A;   EXCISIONAL HEMORRHOIDECTOMY  1984   INTRAVASCULAR ULTRASOUND/IVUS N/A 09/29/2019   Procedure: Intravascular Ultrasound/IVUS;  Surgeon: Nelva Bush, MD;  Location: [facility];  Service: Cardiovascular;  Laterality: N/A;   LEFT HEART CATH AND CORONARY ANGIOGRAPHY N/A 10/25/2018   Procedure: LEFT HEART CATH AND CORONARY ANGIOGRAPHY;  Surgeon: Jettie Booze, MD;  Location: [facility];  Service: Cardiovascular;  Laterality: N/A;   LEFT HEART CATH AND CORONARY ANGIOGRAPHY N/A 09/29/2019   Procedure: LEFT HEART CATH AND CORONARY ANGIOGRAPHY;  Surgeon: Nelva Bush, MD;  Location: [place] [facility];  Service: Cardiovascular;  Laterality: N/A;   LEFT HEART CATHETERIZATION WITH CORONARY ANGIOGRAM N/A 05/11/2011   Procedure: LEFT HEART CATHETERIZATION WITH CORONARY ANGIOGRAM;  Surgeon: Troy Sine, MD;  Location: [facility] CATH LAB;  Service: Cardiovascular;  Laterality: N/A;  Possible percutaneous coronary intervention, possible IVUS   LEFT HEART CATHETERIZATION WITH CORONARY ANGIOGRAM N/A 12/15/2011   Procedure: LEFT HEART CATHETERIZATION WITH CORONARY ANGIOGRAM;  Surgeon:  Lorretta Harp, MD;  Location: [facility] CATH LAB;  Service: Cardiovascular;  Laterality: N/A;   LEFT HEART CATHETERIZATION WITH CORONARY ANGIOGRAM N/A 09/05/2014   Procedure: LEFT HEART CATHETERIZATION WITH CORONARY ANGIOGRAM;  Surgeon: Burnell Blanks, MD;  Location: [name] CATH LAB;  Service: Cardiovascular;  Laterality: N/A;   LIPOMA EXCISION     back of the head   NM MYOCAR PERF WALL MOTION  02/2012   lexiscan myoview; mild perfusion defect in mid inferolateral & basal inferolateral region (infarct/scar); EF 52%, abnormal but ow risk scan   PERCUTANEOUS CORONARY STENT INTERVENTION (PCI-S)  09/05/2014   Procedure: PERCUTANEOUS CORONARY STENT INTERVENTION (PCI-S);  Surgeon: Burnell Blanks, MD;  Location: [facility] CATH LAB;  Service: Cardiovascular;;   TONSILLECTOMY      Family History:  Family History  Problem Relation Age of Onset   Leukemia Mother    Prostate cancer Father    Cancer Brother    Coronary artery disease Paternal Grandmother    Cancer Paternal Grandfather    Migraines Neg Hx    Headache Neg Hx     Social History:  Social History   Socioeconomic History   Marital status: Widowed    Spouse name: Not on file   Number of children: 2   Years of education: GED   Highest education level: Not on file  Occupational History   Not on file  Tobacco Use   Smoking status: Former    Packs/day: 1.00    Years: 10.00    Total pack years: 10.00    Types: Cigarettes    Quit date: 10/07/2018    Years since quitting: 3.6   Smokeless tobacco: Never  Vaping Use   Vaping Use: Never used  Substance and Sexual Activity   Alcohol use: Yes    Comment: 2 bottles of wine on 12/08/2019 (one occurrence)   Drug use: Not Currently    Types: Cocaine    Comment: last use several months ago   Sexual activity: Not Currently  Other Topics Concern   Not on file  Social History Narrative   ** Merged History Encounter **    Pt states he only smoke when he drinks   Social Determinants of  Health   Financial Resource Strain: Low Risk  (12/11/2021)   Overall Financial Resource Strain (CARDIA)    Difficulty of Paying Living Expenses: Not very hard  Food Insecurity: Not on file  Transportation Needs: No Transportation Needs (12/11/2021)   PRAPARE - Hydrologist (Medical): No    Lack of Transportation (Non-Medical): No  Physical Activity: Not on file  Stress: Not on file  Social Connections: Not on file  Intimate Partner Violence: Not on file    SDOH:  [place]: Low Risk  (12/11/2021)  Transportation Needs: No Transportation Needs (12/11/2021)  Alcohol Screen: Medium Risk (08/27/2020)  Depression (PHQ2-9): Low Risk  (03/06/2022)  Recent Concern: Depression (PHQ2-9) - High Risk (01/08/2022)  Financial Resource Strain: Low Risk  (12/11/2021)  Tobacco Use: Medium Risk (03/06/2022)    Last Labs:  Office Visit on 03/06/2022  Component Date Value Ref Range Status   Testosterone 03/06/2022 261 (L)  264 - 916 ng/dL Final   Comment: Adult male reference interval is based on a population of healthy nonobese males (BMI <30) between 54 and 81 years old. [place], [place] [phone]. PMID: [id number].    Vit D, 25-Hydroxy 03/06/2022 40.1  30.0 - 100.0 ng/mL Final   Comment: Vitamin D deficiency has been defined by the [place] practice guideline as a level of serum 25-OH vitamin D less than 20 ng/mL (1,2). The Endocrine Society went on to further define vitamin D insufficiency as a level between 21 and 29 ng/mL (2). 1. IOM (Institute of Medicine). 2010. Dietary reference    intakes for calcium and D. [place]: The    [employer]. 2. Holick MF, Binkley [place], Bischoff-Ferrari HA, et al.    Evaluation, treatment, and prevention of vitamin D    deficiency: an Endocrine Society clinical practice    guideline. JCEM. 2011 Jul; 96(7):1911-30.    Vitamin B-12 03/06/2022 543  232 -  1,245 pg/mL Final  Admission on 01/20/2022, Discharged on 01/21/2022  Component Date Value Ref Range Status   SARS Coronavirus 2 by RT PCR 01/20/2022 NEGATIVE  NEGATIVE Final   Comment: (NOTE) SARS-CoV-2 target nucleic acids are NOT DETECTED.  The SARS-CoV-2 RNA is generally detectable in upper respiratory specimens during the acute phase of infection. The lowest concentration of SARS-CoV-2 viral copies this assay can detect is 138 copies/mL. A negative result does not preclude SARS-Cov-2 infection and should not be used as the sole basis for treatment or other patient management decisions. A negative result may occur with  improper specimen collection/handling, submission of specimen other than nasopharyngeal swab, presence of viral mutation(s) within the areas targeted by this assay, and inadequate number of viral copies(<138 copies/mL). A negative result must be combined with clinical observations, patient history, and epidemiological information. The expected result is Negative.  Fact Sheet for Patients:  EntrepreneurPulse.com.au  Fact Sheet for Healthcare Providers:  IncredibleEmployment.be  This test is no                          t yet approved or cleared by the [place] FDA and  has been authorized for detection and/or diagnosis of SARS-CoV-2 by FDA under an Emergency Use Authorization (EUA). This EUA will remain  in effect (meaning  this test can be used) for the duration of the COVID-19 declaration under Section 564(b)(1) of the Act, 21 U.S.C.section 360bbb-3(b)(1), unless the authorization is terminated  or revoked sooner.       Influenza A by PCR 01/20/2022 NEGATIVE  NEGATIVE Final   Influenza B by PCR 01/20/2022 NEGATIVE  NEGATIVE Final   Comment: (NOTE) The Xpert Xpress SARS-CoV-2/FLU/RSV plus assay is intended as an aid in the diagnosis of influenza from Nasopharyngeal swab specimens and should not be used as a sole basis  for treatment. Nasal washings and aspirates are unacceptable for Xpert Xpress SARS-CoV-2/FLU/RSV testing.  Fact Sheet for Patients: EntrepreneurPulse.com.au  Fact Sheet for Healthcare Providers: IncredibleEmployment.be  This test is not yet approved or cleared by the [place] FDA and has been authorized for detection and/or diagnosis of SARS-CoV-2 by FDA under an Emergency Use Authorization (EUA). This EUA will remain in effect (meaning this test can be used) for the duration of the COVID-19 declaration under Section 564(b)(1) of the Act, 21 U.S.C. section 360bbb-3(b)(1), unless the authorization is terminated or revoked.  Performed at [facility], [place] [address], [place], [place] [address]    Sodium 01/20/2022 139  135 - 145 mmol/L Final   Potassium 01/20/2022 4.2  3.5 - 5.1 mmol/L Final   Chloride 01/20/2022 107  98 - 111 mmol/L Final   CO2 01/20/2022 23  22 - 32 mmol/L Final   Glucose, Bld 01/20/2022 102 (H)  70 - 99 mg/dL Final   Glucose reference range applies only to samples taken after fasting for at least 8 hours.   BUN 01/20/2022 15  6 - 20 mg/dL Final   Creatinine, Ser 01/20/2022 0.93  0.61 - 1.24 mg/dL Final   Calcium 01/20/2022 9.3  8.9 - 10.3 mg/dL Final   Total Protein 01/20/2022 6.2 (L)  6.5 - 8.1 g/dL Final   Albumin 01/20/2022 4.0  3.5 - 5.0 g/dL Final   AST 01/20/2022 22  15 - 41 U/L Final   ALT 01/20/2022 35  0 - 44 U/L Final   Alkaline Phosphatase 01/20/2022 69  38 - 126 U/L Final   Total Bilirubin 01/20/2022 0.2 (L)  0.3 - 1.2 mg/dL Final   GFR, Estimated 01/20/2022 >60  >60 mL/min Final   Comment: (NOTE) Calculated using the CKD-EPI Creatinine Equation (2021)    Anion gap 01/20/2022 9  5 - 15 Final   Performed at [facility] [address] [place], [place] [address]   Hgb A1c MFr Bld 01/20/2022 7.0 (H)  4.8 - 5.6 % Final   Comment: (NOTE) Pre diabetes:          5.7%-6.4%  Diabetes:               >6.4%  Glycemic control for   <7.0% adults with diabetes    Mean Plasma Glucose 01/20/2022 154.2  mg/dL Final   Performed at [facility], [place] [address] [place], [place] [address]   Alcohol, Ethyl (B) 01/20/2022 <10  <10 mg/dL Final   Comment: (NOTE) Lowest detectable limit for serum alcohol is 10 mg/dL.  For medical purposes only. Performed at [facility], [place] [address], [place], [place] [address]    Cholesterol 01/20/2022 122  0 - 200 mg/dL Final   Triglycerides 01/20/2022 150 (H)  <150 mg/dL Final   HDL 01/20/2022 36 (L)  >40 mg/dL Final   Total CHOL/HDL Ratio 01/20/2022 3.4  RATIO Final   VLDL 01/20/2022 30  0 - 40 mg/dL Final   LDL Cholesterol 01/20/2022 56  0 - 99 mg/dL Final   Comment:        Total Cholesterol/HDL:CHD Risk Coronary Heart Disease Risk Table                     Men   Women  1/2 Average Risk   3.4   3.3  Average Risk       5.0   4.4  2 X Average Risk   9.6   7.1  3 X Average Risk  23.4   11.0        Use the calculated Patient Ratio above and the CHD Risk Table to determine the patient's CHD Risk.        ATP III CLASSIFICATION (LDL):  <100     mg/dL   Optimal  100-129  mg/dL   Near or Above                    Optimal  130-159  mg/dL   Borderline  160-189  mg/dL   High  >190     mg/dL   Very High Performed at [facility] [address], [place], [place] [address]    TSH 01/20/2022 1.184  0.350 - 4.500 uIU/mL Final   Comment: Performed by a 3rd Generation assay with a functional sensitivity of <=0.01 uIU/mL. Performed at [facility], [place] [address] [place], [place] [address]    POC Amphetamine UR 01/20/2022 None Detected  NONE DETECTED (Cut Off Level 1000 ng/mL) Final   POC Secobarbital (BAR) 01/20/2022 None Detected  NONE DETECTED (Cut Off Level 300 ng/mL) Final   POC Buprenorphine (BUP) 01/20/2022 None Detected  NONE DETECTED (Cut Off Level 10 ng/mL) Final   POC Oxazepam (BZO) 01/20/2022 Positive (A)  NONE DETECTED  (Cut Off Level 300 ng/mL) Final   POC Cocaine UR 01/20/2022 None Detected  NONE DETECTED (Cut Off Level 300 ng/mL) Final   POC Methamphetamine UR 01/20/2022 None Detected  NONE DETECTED (Cut Off Level 1000 ng/mL) Final   POC Morphine 01/20/2022 None Detected  NONE DETECTED (Cut Off Level 300 ng/mL) Final   POC Methadone UR 01/20/2022 None Detected  NONE DETECTED (Cut Off Level 300 ng/mL) Final   POC Oxycodone UR 01/20/2022 None Detected  NONE DETECTED (Cut Off Level 100 ng/mL) Final   POC Marijuana UR 01/20/2022 None Detected  NONE DETECTED (Cut Off Level 50 ng/mL) Final   SARSCOV2ONAVIRUS 2 AG 01/20/2022 NEGATIVE  NEGATIVE Final   Comment: (NOTE) SARS-CoV-2 antigen NOT DETECTED.   Negative results are presumptive.  Negative results do not preclude SARS-CoV-2 infection and should not be used as the sole basis for treatment or other patient management decisions, including infection  control decisions, particularly in the presence of clinical signs and  symptoms consistent with COVID-19, or in those who have been in contact with the virus.  Negative results must be combined with clinical observations, patient history, and epidemiological information. The expected result is Negative.  Fact Sheet for Patients: HandmadeRecipes.com.cy  Fact Sheet for Healthcare Providers: FuneralLife.at  This test is not yet approved or cleared by the [place] FDA and  has been authorized for detection and/or diagnosis of SARS-CoV-2 by FDA under an Emergency Use Authorization (EUA).  This EUA will remain in effect (meaning this test can be used) for the duration of  the COV  ID-19 declaration under Section 564(b)(1) of the Act, 21 U.S.C. section 360bbb-3(b)(1), unless the authorization is terminated or revoked sooner.    Office Visit on 01/08/2022  Component Date Value Ref Range Status   Hemoglobin A1C 01/08/2022 7.1 (A)  4.0 -  5.6 % Final   HbA1c POC (<> result, manual entry) 01/08/2022 7.1  4.0 - 5.6 % Final   HbA1c, POC (prediabetic range) 01/08/2022 7.1 (A)  5.7 - 6.4 % Final   HbA1c, POC (controlled diabetic ra* 01/08/2022 7.1 (A)  0.0 - 7.0 % Final   POC Glucose 01/08/2022 169 (A)  70 - 99 mg/dl Final  Office Visit on 12/04/2021  Component Date Value Ref Range Status   Hgb A1c MFr Bld 12/04/2021 7.0 (H)  4.8 - 5.6 % Final   Comment:          Prediabetes: 5.7 - 6.4          Diabetes: >6.4          Glycemic control for adults with diabetes: <7.0    Est. average glucose Bld gHb Est-m* 12/04/2021 154  mg/dL Final   WBC 12/04/2021 5.0  3.4 - 10.8 x10E3/uL Final   RBC 12/04/2021 5.09  4.14 - 5.80 x10E6/uL Final   Hemoglobin 12/04/2021 14.3  13.0 - 17.7 g/dL Final   Hematocrit 12/04/2021 43.0  37.5 - 51.0 % Final   MCV 12/04/2021 85  79 - 97 fL Final   MCH 12/04/2021 28.1  26.6 - 33.0 pg Final   MCHC 12/04/2021 33.3  31.5 - 35.7 g/dL Final   RDW 12/04/2021 12.8  11.6 - 15.4 % Final   Platelets 12/04/2021 240  150 - 450 x10E3/uL Final   Glucose 12/04/2021 157 (H)  70 - 99 mg/dL Final   BUN 12/04/2021 14  8 - 27 mg/dL Final   Creatinine, Ser 12/04/2021 1.03  0.76 - 1.27 mg/dL Final   eGFR 12/04/2021 83  >59 mL/min/1.73 Final   BUN/Creatinine Ratio 12/04/2021 14  10 - 24 Final   Sodium 12/04/2021 140  134 - 144 mmol/L Final   Potassium 12/04/2021 4.5  3.5 - 5.2 mmol/L Final   Chloride 12/04/2021 105  96 - 106 mmol/L Final   CO2 12/04/2021 22  20 - 29 mmol/L Final   Calcium 12/04/2021 9.4  8.6 - 10.2 mg/dL Final   Total Protein 12/04/2021 7.0  6.0 - 8.5 g/dL Final   Albumin 12/04/2021 4.7  3.8 - 4.9 g/dL Final   Globulin, Total 12/04/2021 2.3  1.5 - 4.5 g/dL Final   Albumin/Globulin Ratio 12/04/2021 2.0  1.2 - 2.2 Final   Bilirubin Total 12/04/2021 0.3  0.0 - 1.2 mg/dL Final   Alkaline Phosphatase 12/04/2021 74  44 - 121 IU/L Final   AST 12/04/2021 22  0 - 40 IU/L Final   ALT 12/04/2021 53 (H)  0 - 44 IU/L  Final    Allergies: Bee venom, Gadolinium, Shellfish allergy, Statins, and Testosterone cypionate   Long Term Goals: Improvement in symptoms so as ready for discharge  Short Term Goals: Patient will attend at least of 50% of the groups daily., Pt will complete the PHQ9 on admission, day 3 and discharge., and Patient will take medications as prescribed daily.  Medical Decision Making  Patient is voluntary. Patient admitted to the [facility] unit for alcohol detox.   Lab Orders         Resp Panel by RT-PCR (Flu A&B, Covid) Anterior Nasal Swab  CBC with Differential/Platelet         Comprehensive metabolic panel         Hemoglobin A1c         Ethanol         Lipid panel         TSH         POCT Urine Drug Screen - (I-Screen)      AUD  Ativan 1 mg po taper-consider changing to librium once liver enzymes result  CIWA   HTN/cardiac Continue Norvasc 10 mg p.o. daily Continue metoprolol 100 mg p.o. twice daily Continue Plavix 75 mg p.o. daily Continue aspirin 81 mg p.o. daily Continue nitroglycerin 0.4 mg SL as needed for chest pain Consult to cardiologist ordered for abnormal EKG (nonspecific T wave abnormality). Patient is asymptomatics   DM  Daily CBG  Continue metformin 1000 mg p.o. twice daily   Recommendations  Based on my evaluation the patient does not appear to have an emergency medical condition.  Marissa Calamity, NP 05/16/22  12:59 PM

## 2022-05-16 NOTE — BH Assessment (Signed)
Comprehensive Clinical Assessment (CCA) Note  05/16/2022 AYHAM WORD [id number]  Disposition: Per Darrol Angel, NP admission to [facility] is recommended.   The patient demonstrates the following risk factors for suicide: Chronic risk factors for suicide include: psychiatric disorder of MDD, hx of SI/attempts and substance use disorder. Acute risk factors for suicide include: social withdrawal/isolation and loss (financial, interpersonal, professional). Protective factors for this patient include: positive social support, responsibility to others (children, family), coping skills, and hope for the future. Considering these factors, the overall suicide risk at this point appears to be low. Patient is appropriate for outpatient follow up.  Patient is a 61 year old male with a history of Alcohol Use Disorder, severe, GAD and Major Depressive Disorder, recurrent, moderate who presents voluntarily to [facility] for assessment.  Patient reports ongoing problems with alcohol and is requesting detox treatment.   Patient completed detox 6 months ago and states he was sober for 2 months before relapsing.  He reports he was struggling with anxiety, which was a trigger for his relapse 4 months ago.  He has been binge drinking since, reporting a typical pattern is to binge drink for an entire day and then be in the bed for the next 2-3 days before staring the cycle again.  Patient states the longest period of sobriety was 4 years and 1 month approximately 11 years ago.  He has had difficulty maintaining sobriety after losing his wife to breast cancer in 2020.  Patient reports he has also used cocaine on and off since age 89.  He uses more occasionally, with last use being several days ago.  Prior to this, his last use was 4 months ago.  Patient denies SI, HI and AVH. He has battled depression in the past and has been Rx Wellbutrin.  He reports he stopped medication, as he "was feeling good and didn't  need it."  Patient states he was seeing a therapist in 2021.  Treatment options were discussed and he is agreeable with recommendation for admission to [facility] for detox.   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 Self  What Is the Reason for Your Visit/Call Today? Pt presents to [facility] voluntarily seeking alcohol detox and treatment. Pt states  that he consumes about 1 pint of gin and 2,16oz beers daily. Pt reports drinking a pint of gin this morning. Pt has a scent of alcohol on his breath currently. Pt reports having treatment [facility] for 6 days about 6 months ago. Pt denies SI/HI and AVH at this time.  How Long Has This Been Causing You Problems? > than 6 months  What Do You Feel Would Help You the Most Today? Alcohol or Drug Use Treatment   Have You Recently Had Any Thoughts About Hurting Yourself? No  Are You Planning to Commit Suicide/Harm Yourself At This time? No   Flowsheet Row ED from 05/16/2022 in [facility] ED  from 01/20/2022 in [facility] ED from 10/10/2021 in [facility] [place] No Risk No Risk No Risk       Have you Recently Had Thoughts About [employer]? No  Are You Planning to Harm Someone at This Time? No  Explanation: No data recorded  Have You Used Any Alcohol or Drugs in the Past 24 Hours? Yes  What Did You Use and How Much? 1 pint of gin (alcohol)   Do You Currently Have a Therapist/Psychiatrist? No  Name of Therapist/Psychiatrist: Name of Therapist/Psychiatrist: N/A   Have You Been Recently Discharged From Any Office Practice or Programs? No  Explanation of Discharge From Practice/Program: N/A     CCA Screening Triage Referral Assessment Type of Contact: Face-to-Face  Telemedicine Service Delivery:   Is this Initial or Reassessment?   Date Telepsych consult ordered in [facility]:    Time Telepsych consult ordered in [facility]:    Location of Assessment: [facility] Assessment Services  Provider Location: [facility] Assessment Services   Collateral Involvement: N/A   Does Patient Have a Stage manager Guardian? No  Legal Guardian Contact Information: N/A  Copy of Legal Guardianship Form: No data recorded Legal Guardian Notified of Arrival: No data recorded Legal Guardian Notified of Pending Discharge: No data recorded If Minor and Not Living with Parent(s), Who has Custody? N/A  Is CPS involved or ever been involved? Never  Is APS involved or ever been involved? Never   Patient Determined To Be At Risk for Harm To Self or Others Based on Review of Patient Reported Information or Presenting Complaint? No  Method: No data recorded Availability of Means: No data recorded Intent: No data recorded Notification Required: No data recorded Additional Information for Danger to Others Potential: No data recorded Additional Comments for Danger to Others Potential: No data recorded Are There Guns  or Other Weapons in Your Home? No  Types of Guns/Weapons: No data recorded Are These Weapons Safely Secured?                            No data recorded Who Could Verify You Are Able To Have These Secured: No data recorded Do You Have any Outstanding Charges, Pending Court Dates, Parole/Probation? None reported  Contacted To Inform of Risk of Harm To Self or Others: No data recorded   Does Patient Present under Involuntary Commitment? No    [place] of Residence: [place]   Patient Currently Receiving the Following Services: Not Receiving Services   Determination of Need: Urgent (48 hours)   Options For Referral: Facility-Based Crisis; Outpatient Therapy     CCA Biopsychosocial Patient Reported Schizophrenia/Schizoaffective Diagnosis in Past: No   Strengths: Seeking treatment,  has support   Mental Health Symptoms Depression:   Hopelessness; Worthlessness; Tearfulness ("Existing and not living.")   Duration of Depressive symptoms:  Duration of Depressive Symptoms: Greater than two weeks   Mania:   None   Anxiety:    Worrying; Tension   Psychosis:   None   Duration of Psychotic symptoms:    Trauma:   None   Obsessions:   None   Compulsions:   None   Inattention:   None   Hyperactivity/Impulsivity:   N/A   Oppositional/Defiant Behaviors:   N/A   Emotional Irregularity:   N/A   Other Mood/Personality Symptoms:   None    Mental Status Exam Appearance and self-care  Stature:   Average   Weight:   Average weight   Clothing:   Neat/clean; Age-appropriate   Grooming:   Well-groomed   Cosmetic use:   None   Posture/gait:   Normal   Motor activity:   Not Remarkable   Sensorium  Attention:   Normal   Concentration:   Normal   Orientation:   X5   Recall/memory:   Normal   Affect and Mood  Affect:   Appropriate   Mood:   Other (Comment) (Pleasant)   Relating  Eye contact:   Normal   Facial expression:    Responsive   Attitude toward examiner:   Cooperative   Thought and Language  Speech flow:  Clear and Coherent   Thought content:   Appropriate to Mood and Circumstances   Preoccupation:   None   Hallucinations:   None   Organization:   Coherent   [employer] of Knowledge:   Average   Intelligence:   Average   Abstraction:   Normal   Judgement:   Normal   Reality Testing:   Adequate   Insight:   Good   Decision Making:   Normal   Social Functioning  Social Maturity:  No data recorded  Social Judgement:   Normal   Stress  Stressors:   Museum/gallery curator; Other (Comment); Family conflict (Addiction)   Coping Ability:   Resilient   Skill Deficits:   Interpersonal; Self-control   Supports:   Friends/Service system; Family ([facility])     Religion: Religion/Spirituality Are You A Religious Person?: Yes How Might This Affect Treatment?: None  Leisure/Recreation: Leisure / Recreation Do You Have Hobbies?: Yes Leisure and Hobbies: Musician, ride mountain bikes, groom animals  Exercise/Diet: Exercise/Diet Do You Exercise?: Yes What Type of Exercise Do You Do?: Bike, Weight Training How Many Times a Week Do You Exercise?: 1-3 times a week Have You Gained or Lost A Significant Amount of Weight in the Past Six Months?: No Do You Follow a Special Diet?: No Do You Have Any Trouble Sleeping?: No   CCA Employment/Education Employment/Work Situation:    Education:     CCA Family/Childhood History Family and Relationship History: Family history Marital status: Widowed Widowed, when?: 2020 Does patient have children?: Yes How is patient's relationship with their children?: No concerns outside of not getting to see them enough anymore. Both children live out of state.  Childhood History:  Childhood History By whom was/is the patient raised?: Both parents Did patient suffer any verbal/emotional/physical/sexual abuse as a  child?: No Did patient suffer from severe childhood neglect?: No Has patient ever been sexually abused/assaulted/raped as an adolescent or adult?: No Was the patient ever a victim of a crime or a disaster?: No Witnessed domestic violence?: No Has patient been  affected by domestic violence as an adult?: No       CCA Substance Use Alcohol/Drug Use: Alcohol / Drug Use Pain Medications: See MAR Prescriptions: See MAR Over the Counter: See MAR History of alcohol / drug use?: Yes Longest period of sobriety (when/how long): 4 years and 1 month Negative Consequences of Use: Financial, Personal relationships Withdrawal Symptoms: None Substance #1 Name of Substance 1: ETOH 1 - Age of First Use: 23 1 - Amount (size/oz): 1 pint of gin, with 16 oz beer daily 1 - Frequency: daily 1 - Duration: relapsed 4 months ago 1 - Last Use / Amount: this morning/ 1 pint of gin 1 - Method of Aquiring: NA 1- Route of Use: drinks                       ASAM's:  Six Dimensions of Multidimensional Assessment  Dimension 1:  Acute Intoxication and/or Withdrawal Potential:   Dimension 1:  Description of individual's past and current experiences of substance use and withdrawal: Hx of black outs per EHR, no current withdrawal sx as of yet  Dimension 2:  Biomedical Conditions and Complications:   Dimension 2:  Description of patient's biomedical conditions and  complications: Heart disease  Dimension 3:  Emotional, Behavioral, or Cognitive Conditions and Complications:  Dimension 3:  Description of emotional, behavioral, or cognitive conditions and complications: Underlying MDD  Dimension 4:  Readiness to Change:  Dimension 4:  Description of Readiness to Change criteria: Seeking treatment  Dimension 5:  Relapse, Continued use, or Continued Problem Potential:  Dimension 5:  Relapse, continued use, or continued problem potential critiera description: High Risk for Relapse  Dimension 6:  Recovery/Living  Environment:  Dimension 6:  Recovery/Iiving environment criteria description: has support  ASAM Severity Score: ASAM's Severity Rating Score: 11  ASAM Recommended Level of Treatment: ASAM Recommended Level of Treatment: Level III Residential Treatment   Substance use Disorder (SUD) Substance Use Disorder (SUD)  Checklist Symptoms of Substance Use: Continued use despite having a persistent/recurrent physical/psychological problem caused/exacerbated by use, Continued use despite persistent or recurrent social, interpersonal problems, caused or exacerbated by use, Evidence of tolerance, Persistent desire or unsuccessful efforts to cut down or control use, Recurrent use that results in a failure to fulfill major role obligations (work, school, home), Presence of craving or strong urge to use, Social, occupational, recreational activities given up or reduced due to use, Substance(s) often taken in larger amounts or over longer times than was intended  Recommendations for Services/Supports/Treatments: Recommendations for Services/Supports/Treatments Recommendations For Services/Supports/Treatments: Medication Management, SAIOP (Substance Abuse Intensive Outpatient Program), Facility Based Crisis  Discharge Disposition:    DSM5 Diagnoses: Patient Active Problem List   Diagnosis Date Noted   Alcohol dependence ([place]) 05/16/2022   Low energy 03/06/2022   Moderate alcohol dependence in early remission ([place]) 02/24/2022   Suicidal ideation 07/07/2021   Generalized anxiety disorder 07/07/2021   Other allergic rhinitis 09/09/2020   Adverse reaction to food, subsequent encounter 09/09/2020   History of bee sting allergy 09/09/2020   Alcohol dependence with alcohol-induced mood disorder ([place]) 03/19/2020   MDD (major depressive disorder), recurrent episode, severe ([place]) 03/18/2020   History of substance abuse ([place]) 01/04/2020   Severe 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 features ([place]) 12/10/2019    Tinea cruris 07/06/2019   Hemoglobin A1c less than 7.0% 07/06/2019   Muscle strain of wrist 04/05/2019   Pain in both wrists 04/05/2019   Anxiety 04/05/2019   Prediabetes 04/05/2019  MDD (major depressive disorder) 01/12/2019   Chest pain 10/25/2018   MDD (major depressive disorder), recurrent severe, without psychosis ([place]) 08/15/2018   Influenza A 07/17/2017   Lactic acidosis    Cough    Nausea and vomiting    Severe episode of recurrent major depressive disorder, without psychotic features ([place])    MDD (major depressive disorder), single episode, severe with psychotic features ([place]) 06/27/2017   MDD (major depressive disorder), severe ([place]) 06/23/2017   Essential hypertension 01/13/2016   Unstable angina [facility])    ED (erectile dysfunction) 02/15/2014   Atypical chest pain 12/14/2011   Allergy to contrast media (used for diagnostic x-rays) 05/12/2011   Dyslipidemia, goal LDL below 70 05/09/2011   DM2 (diabetes mellitus, type 2) ([place]) 07/10/2010   CAD S/P percutaneous coronary angioplasty 07/10/2010     Referrals to Alternative Service(s): Referred to Alternative Service(s):   Place:   Date:   Time:    Referred to Alternative Service(s):   Place:   Date:   Time:    Referred to Alternative Service(s):   Place:   Date:   Time:    Referred to Alternative Service(s):   Place:   Date:   Time:     Fransico Meadow, [facility]

## 2022-05-16 NOTE — ED Notes (Signed)
Pt is attending AA. 

## 2022-05-16 NOTE — ED Notes (Signed)
Patient admitted to [facility] from [facility] for detox from ETOH and cocaine.  Last use of ETOH was this morning.  Patient used cocaine 2 days ago.  He is calm, cooperative and organized in thought with soft speech.  He was oriented to unit and shown to his room.  Patient also given dinner and is eating now.  No evidence of ETOH withdrawal at this time.  Will monitor and provide safe supportive environment.

## 2022-05-17 DIAGNOSIS — I10 Essential (primary) hypertension: Secondary | ICD-10-CM | POA: Diagnosis not present

## 2022-05-17 DIAGNOSIS — E119 Type 2 diabetes mellitus without complications: Secondary | ICD-10-CM | POA: Diagnosis not present

## 2022-05-17 DIAGNOSIS — Z9151 Personal history of suicidal behavior: Secondary | ICD-10-CM | POA: Diagnosis not present

## 2022-05-17 DIAGNOSIS — F1020 Alcohol dependence, uncomplicated: Secondary | ICD-10-CM | POA: Diagnosis not present

## 2022-05-17 MED ORDER — EZETIMIBE 10 MG PO TABS
10.0000 mg | ORAL_TABLET | Freq: Every day | ORAL | Status: DC
  Administered 2022-05-17 – 2022-05-20 (×4): 10 mg via ORAL
  Filled 2022-05-17 (×4): qty 1

## 2022-05-17 NOTE — ED Notes (Signed)
Pt is sleeping. No distress noted. Will continue to monitor safety. 

## 2022-05-17 NOTE — ED Notes (Signed)
Pt sleeping in no acute distress. RR even and unlabored. Environment secured. Will continue to monitor for safety. 

## 2022-05-17 NOTE — ED Notes (Signed)
Patient A&Ox4. Denies intent to harm self/others when asked. Denies A/VH. Patient complain of back ache. Tylenol given for sx relief. Denies withdrawal sx from polysubstances. Support and encouragement provided. Routine safety checks conducted according to facility protocol. Encouraged patient to notify staff if thoughts of harm toward self or others arise. Patient verbalize understanding and agreement. Will continue to monitor for safety.

## 2022-05-17 NOTE — ED Provider Notes (Cosign Needed Addendum)
Behavioral Health Progress Note  Date and Time: 05/17/2022 3:29 PM Name: AKIF WELDY MRN:  [id number]  Subjective:  CASANOVA SCHURMAN is a 61 year old male patient with a past psychiatric history significant for alcohol dependence, MDD, GAD, and past suicide attempts who presented to the [facility] behavioral health urgent care voluntary seeking alcohol detox.  Patient reports that he has been in detox and rehab before. Patient reports that he has gone 4 years and 1 mon sober before relapsing with Etoh. Patient reports that he was most recently 3 mon sober from his last rehab but relapsed endorsing that Etoh helped with his GAD and cocaine helped with his dysphoric mood. Patient reports that once he was dx with GAD he did not feel the Hydroxyzine he was prescribed was helpful, so he continued to drink and did research where he learned that his caffeine intake may be contributing to his anxiety. Patient reports that he stopped his caffeine intake and his anxiety significantly improved. Patient reports that unfortunately he continued to deal with cravings for Etoh and therefore he continued to drink and use substances. Patient reports he would like to get back to rehab because he knows he can be successful with the 12 step program.  Patient reports that he is aware that not only is his drinking negatively impacting his mental health, but his physical. Patient reports that the last few months where he has been drinking he has been eating "junk food" when he normally is a vegetarian and is mindful of his T2DM. Patient reports that he thinks that his mood would also improve if he did not have substances in his system and is not interested in starting any medication for his mood despite his hx of MDD and GAD. Patient denies SI, HI, and AVH. Patient also denies any tactile hallucinations. Patient reports that in the past he has had blackouts during withdrawal but never aggression or seizures.  Patient  reports that now that he is in detox his mood is " a little upbeat, maybe a 7/10, with 10 being the best." Patient also endorses that he slept well last night.    Patient reports his last drink was 05/16/22. Patient endorses that he had a binge pattern, of drinking all day before going 2-3 days without drinking and then restarting due to cravings.    MD Group Therapy: Patient attended group and was engaged. Patient was very insightful and provided the group additional perspective as to triggers and grief and how to handle these things in the context of Distress Tolerance.   Diagnosis:  Final diagnoses:  Uncomplicated alcohol dependence ([place])  Type 2 diabetes mellitus without complication, without long-term current use of insulin ([place])  Essential hypertension    Total Time spent with patient: 1 hour  Past Psychiatric History: See H&P Past Medical History:  Past Medical History:  Diagnosis Date   Alcohol dependence ([place])    Allergies    Arthritis    Chest pain    Chronic lower back pain    Chronic pain of right wrist    Coronary artery disease    a. Multiple prior caths/PCI. Cath 2013 with possible spasm of RCA, 70% ISR of mid LCx with subsequent DES to mLCx and prox LCX. b. H/o microvascular angina. c. Recurrent angina 08/2014 - s/p PTCA/DES to prox Cx, PTCA/CBA to OM1.  c. LHC 06/10/15 with patent stents and some ISR in LCX and OM-1 that was not flow limiting --> Rx  Dyslipidemia    a. Intolerant to many statins except tolerating Livalo.   GERD (gastroesophageal reflux disease)    H/O cardiac catheterization 10/25/2018   Heart attack [facility])    Hypertension    Myocardial infarction [facility]) ~ 2010   S/P angioplasty with stent, DES, to proximal and mid LCX 12/15/11 12/15/2011   S/P foot surgery, right 04/2021   Shoulder pain    Stroke ([place])    pt. reports had a stroke around time of MI 2010   Type II diabetes mellitus ([facility])    Unstable angina [facility])     Past Surgical History:   Procedure Laterality Date   CARDIAC CATHETERIZATION  06/15/2002   LAD with prox 40% stenosis, norma L main, Cfx with 25% lesion, RCA with long mid 25% stenosis (Dr. Vita Barley)   CARDIAC CATHETERIZATION  04/01/2010   normal L main, LAD wit mild stenosis, L Cfx with 70% in-stent restenosis, RCA with 70% in-stent restenosis, LVEF >60% (Dr. K. Mali Hilty) - cutting ballon arthrectomy to RCA & Cfx (Dr. Rockne Menghini)   CARDIAC CATHETERIZATION  08/25/2010   preserved global LV contractility; multivessel CAD, diffuse 90-95% in-stent restenosis in prox placed Cfx stent - cutting balloon arthrectomy in Cfx with multiple dilatations 90-95% to 0% stenosis (Dr. Corky Downs)   CARDIAC CATHETERIZATION  01/26/2011   PCI & stenting of aggresive in-stent restenosis within previously stented AV groove Cfx with 3.0x67m Taxus DES (previous stents were Promus) (Dr. JAdora Fridge   CARDIAC CATHETERIZATION  05/11/2011   preserved LV function, 40% mid LAD stenosis, 30-40% narrowing proximal to stented semgnet of prox Cfx, patent mid RCA stent with smooth 20% narrowing in distal RCA (Dr. TCorky Downs   CARDIAC CATHETERIZATION  12/15/2011   PCI & stenting of proximal & mid Cfx with DES - 3.0x162min proximal, 3.0x1539mn mid (Dr. J. Adora Fridge CARKianaA 06/10/2015   Procedure: Left Heart Cath and Coronary Angiography;  Surgeon: DanJolaine ArtistD;  Location: [facility] [place] [facility];  Service: Cardiovascular;  Laterality: N/A;   CARDIAC CATHETERIZATION  04/67/89/3810cardiometabolic testing  08/09/73/1025good exercise effort, peak VO2 79% predicted with normal VO2 HR curves (mild deconditioning)   COLONOSCOPY  12/2012   diminutive hyperplastic sigmoid poyp so repeat routine 2024   CORONARY BALLOON ANGIOPLASTY N/A 10/25/2018   Procedure: CORONARY BALLOON ANGIOPLASTY;  Surgeon: VarJettie BoozeD;  Location: [facility] [place] [facility];  Service: Cardiovascular;  Laterality: N/A;   CORONARY BALLOON ANGIOPLASTY N/A 09/29/2019    Procedure: CORONARY BALLOON ANGIOPLASTY;  Surgeon: EndNelva BushD;  Location: [facility] [place] [facility];  Service: Cardiovascular;  Laterality: N/A;   EXCISIONAL HEMORRHOIDECTOMY  1984   INTRAVASCULAR ULTRASOUND/IVUS N/A 09/29/2019   Procedure: Intravascular Ultrasound/IVUS;  Surgeon: EndNelva BushD;  Location: [facility] [place] [facility];  Service: Cardiovascular;  Laterality: N/A;   LEFT HEART CATH AND CORONARY ANGIOGRAPHY N/A 10/25/2018   Procedure: LEFT HEART CATH AND CORONARY ANGIOGRAPHY;  Surgeon: VarJettie BoozeD;  Location: [facility] [place] [facility];  Service: Cardiovascular;  Laterality: N/A;   LEFT HEART CATH AND CORONARY ANGIOGRAPHY N/A 09/29/2019   Procedure: LEFT HEART CATH AND CORONARY ANGIOGRAPHY;  Surgeon: EndNelva BushD;  Location: [facility] [place] [facility];  Service: Cardiovascular;  Laterality: N/A;   LEFT HEART CATHETERIZATION WITH CORONARY ANGIOGRAM N/A 05/11/2011   Procedure: LEFT HEART CATHETERIZATION WITH CORONARY ANGIOGRAM;  Surgeon: ThoTroy SineD;  [facility];  Service: Cardiovascular;  Laterality: N/A;  Possible percutaneous coronary intervention, possible IVUS   LEFT HEART CATHETERIZATION WITH CORONARY ANGIOGRAM N/A 12/15/2011   Procedure: LEFT HEART CATHETERIZATION WITH CORONARY ANGIOGRAM;  Surgeon: Lorretta Harp, MD;  Location: [facility] CATH LAB;  Service: Cardiovascular;  Laterality: N/A;   LEFT HEART CATHETERIZATION WITH CORONARY ANGIOGRAM N/A 09/05/2014   Procedure: LEFT HEART CATHETERIZATION WITH CORONARY ANGIOGRAM;  Surgeon: Burnell Blanks, MD;  Location: [facility] CATH LAB;  Service: Cardiovascular;  Laterality: N/A;   LIPOMA EXCISION     back of the head   NM MYOCAR PERF WALL MOTION  02/2012   lexiscan myoview; mild perfusion defect in mid inferolateral & basal inferolateral region (infarct/scar); EF 52%, abnormal but ow risk scan   PERCUTANEOUS CORONARY STENT INTERVENTION (PCI-S)  09/05/2014   Procedure: PERCUTANEOUS CORONARY STENT INTERVENTION (PCI-S);   Surgeon: Burnell Blanks, MD;  Location: [facility] CATH LAB;  Service: Cardiovascular;;   TONSILLECTOMY     Family History:  Family History  Problem Relation Age of Onset   Leukemia Mother    Prostate cancer Father    Cancer Brother    Coronary artery disease Paternal Grandmother    Cancer Paternal Grandfather    Migraines Neg Hx    Headache Neg Hx    Family Psychiatric  History: See H&P Social History:  Social History   Substance and Sexual Activity  Alcohol Use Yes   Comment: 2 bottles of wine on 12/08/2019 (one occurrence)     Social History   Substance and Sexual Activity  Drug Use Not Currently   Types: Cocaine   Comment: last use several months ago    Social History   Socioeconomic History   Marital status: Widowed    Spouse name: Not on file   Number of children: 2   Years of education: GED   Highest education level: Not on file  Occupational History   Not on file  Tobacco Use   Smoking status: Former    Packs/day: 1.00    Years: 10.00    Total pack years: 10.00    Types: Cigarettes    Quit date: 10/07/2018    Years since quitting: 3.6   Smokeless tobacco: Never  Vaping Use   Vaping Use: Never used  Substance and Sexual Activity   Alcohol use: Yes    Comment: 2 bottles of wine on 12/08/2019 (one occurrence)   Drug use: Not Currently    Types: Cocaine    Comment: last use several months ago   Sexual activity: Not Currently  Other Topics Concern   Not on file  Social History Narrative   ** Merged History Encounter **    Pt states he only smoke when he drinks   Social Determinants of Health   Financial Resource Strain: Low Risk  (12/11/2021)   Overall Financial Resource Strain (CARDIA)    Difficulty of Paying Living Expenses: Not very hard  Food Insecurity: Not on file  Transportation Needs: No Transportation Needs (12/11/2021)   PRAPARE - Hydrologist (Medical): No    Lack of Transportation (Non-Medical): No   Physical Activity: Not on file  Stress: Not on file  Social Connections: Not on file   SDOH:  [place]: Low Risk  (12/11/2021)  Transportation Needs: No Transportation Needs (12/11/2021)  Alcohol Screen: Medium Risk (08/27/2020)  Depression (PHQ2-9): High Risk (05/16/2022)  Financial Resource Strain: Low Risk  (12/11/2021)  Tobacco Use: Medium Risk (03/06/2022)   Additional Social History:  Pain Medications: See MAR Prescriptions: See MAR Over the Counter: See MAR History of alcohol / drug use?: Yes Longest period of sobriety (when/how long): 4 years and 1 month Negative Consequences of Use: Financial, Personal relationships Withdrawal Symptoms: None Name of Substance 1: ETOH 1 - Age of First Use: 23 1 - Amount (size/oz): 1 pint of gin, with 16 oz beer daily 1 - Frequency: daily 1 - Duration: relapsed 4 months ago 1 - Last Use / Amount: this morning/ 1 pint of gin 1 - Method of Aquiring: NA 1- Route of Use: drinks                  Sleep: Good  Appetite:  Good  Current Medications:  Current Facility-Administered Medications  Medication Dose Route Frequency Provider Last Rate Last Admin   acetaminophen (TYLENOL) tablet 650 mg  650 mg Oral Q6H PRN White, Patrice L, NP   650 mg at 05/17/22 1048   alum & mag hydroxide-simeth (MAALOX/MYLANTA) 200-200-20 MG/5ML suspension 30 mL  30 mL Oral Q4H PRN White, Patrice L, NP       amLODipine (NORVASC) tablet 10 mg  10 mg Oral Daily White, Patrice L, NP   10 mg at 05/17/22 0830   aspirin EC tablet 81 mg  81 mg Oral Daily White, Patrice L, NP   81 mg at 05/17/22 1043   clopidogrel (PLAVIX) tablet 75 mg  75 mg Oral Daily White, Patrice L, NP   75 mg at 05/17/22 1043   ezetimibe (ZETIA) tablet 10 mg  10 mg Oral Daily Damita Dunnings B, MD   10 mg at 05/17/22 1048   hydrOXYzine (ATARAX) tablet 25 mg  25 mg Oral TID PRN White, Patrice L, NP       loperamide (IMODIUM) capsule 2-4 mg  2-4 mg Oral PRN White, Patrice L, NP        LORazepam (ATIVAN) tablet 1 mg  1 mg Oral Q6H PRN White, Patrice L, NP       LORazepam (ATIVAN) tablet 1 mg  1 mg Oral TID White, Patrice L, NP       Followed by   Derrill Memo ON 05/18/2022] LORazepam (ATIVAN) tablet 1 mg  1 mg Oral BID White, Patrice L, NP       Followed by   Derrill Memo ON 05/20/2022] LORazepam (ATIVAN) tablet 1 mg  1 mg Oral Daily White, Patrice L, NP       magnesium hydroxide (MILK OF MAGNESIA) suspension 30 mL  30 mL Oral Daily PRN White, Patrice L, NP       metFORMIN (GLUCOPHAGE) tablet 1,000 mg  1,000 mg Oral BID WC White, Patrice L, NP   1,000 mg at 05/17/22 0830   metoprolol tartrate (LOPRESSOR) tablet 100 mg  100 mg Oral BID White, Patrice L, NP   100 mg at 05/17/22 1043   multivitamin with minerals tablet 1 tablet  1 tablet Oral Daily White, Patrice L, NP   1 tablet at 05/17/22 1043   nitroGLYCERIN (NITROSTAT) SL tablet 0.4 mg  0.4 mg Sublingual Q5 Min x 3 PRN White, Patrice L, NP       ondansetron (ZOFRAN-ODT) disintegrating tablet 4 mg  4 mg Oral Q6H PRN White, Patrice L, NP       thiamine (VITAMIN B1) tablet 100 mg  100 mg Oral Daily White, Patrice L, NP   100 mg at 05/17/22 1043   Current Outpatient Medications  Medication Sig Dispense Refill   Accu-Chek Softclix Lancets  lancets SMARTSIG:1 Topical 4 Times Daily     amLODipine (NORVASC) 10 MG tablet Take 1 tablet by mouth once daily 90 tablet 3   aspirin 81 MG EC tablet Take 1 tablet (81 mg total) by mouth daily. (May buy from over the counter): Swallow whole for heart health (Patient taking differently: Take 81 mg by mouth at bedtime. (May buy from over the counter): Swallow whole for heart health) 30 tablet 0   blood glucose meter kit and supplies KIT Dispense based on patient and insurance preference. Use up to four times daily as directed. (FOR ICD-9 250.00, 250.01). 1 each 0   Blood Pressure Monitor KIT 1 kit by Does not apply route daily. 1 kit 0   cholecalciferol (VITAMIN D3) 25 MCG (1000 UNIT) tablet Take 5,000  Units by mouth every other day.     clopidogrel (PLAVIX) 75 MG tablet Take 1 tablet by mouth once daily 90 tablet 3   ezetimibe (ZETIA) 10 MG tablet Take 1 tablet (10 mg total) by mouth daily. 30 tablet 2   glucose blood (ACCU-CHEK GUIDE) test strip USE AS DIRECTED to check blood sugars three times a day. 300 each 0   LIVALO 4 MG TABS Take 1 tablet by mouth once daily 90 tablet 3   metFORMIN (GLUCOPHAGE) 1000 MG tablet Take 1 tablet (1,000 mg total) by mouth 2 (two) times daily with a meal. 180 tablet 3   metoprolol tartrate (LOPRESSOR) 100 MG tablet Take 1 tablet (100 mg total) by mouth 2 (two) times daily. 180 tablet 3   Polyethyl Glycol-Propyl Glycol (SYSTANE) 0.4-0.3 % GEL ophthalmic gel Place 1 Application into both eyes daily as needed (For dry eyes).     sildenafil (VIAGRA) 100 MG tablet Take 1 tablet (100 mg total) by mouth daily as needed. 10 tablet 5   Testosterone (ANDROGEL PUMP) 20.25 MG/ACT (1.62%) GEL Place 1 Pump onto the skin daily. 75 g 2   cyanocobalamin 1000 MCG tablet Take 1,000 mcg by mouth daily.     EPINEPHrine 0.3 mg/0.3 mL IJ SOAJ injection Inject 0.3 mg into the muscle as needed for anaphylaxis. 1 each 2   nitroGLYCERIN (NITROSTAT) 0.4 MG SL tablet Place 1 tablet (0.4 mg total) under the tongue every 5 (five) minutes x 3 doses as needed for chest pain. 25 tablet 3    Labs  Lab Results:  Admission on 05/16/2022  Component Date Value Ref Range Status   SARS Coronavirus 2 by RT PCR 05/16/2022 NEGATIVE  NEGATIVE Final   Comment: (NOTE) SARS-CoV-2 target nucleic acids are NOT DETECTED.  The SARS-CoV-2 RNA is generally detectable in upper respiratory specimens during the acute phase of infection. The lowest concentration of SARS-CoV-2 viral copies this assay can detect is 138 copies/mL. A negative result does not preclude SARS-Cov-2 infection and should not be used as the sole basis for treatment or other patient management decisions. A negative result may occur with   improper specimen collection/handling, submission of specimen other than nasopharyngeal swab, presence of viral mutation(s) within the areas targeted by this assay, and inadequate number of viral copies(<138 copies/mL). A negative result must be combined with clinical observations, patient history, and epidemiological information. The expected result is Negative.  Fact Sheet for Patients:  EntrepreneurPulse.com.au  Fact Sheet for Healthcare Providers:  IncredibleEmployment.be  This test is no                          t yet approved  or cleared by the [place] and  has been authorized for detection and/or diagnosis of SARS-CoV-2 by FDA under an Emergency Use Authorization (EUA). This EUA will remain  in effect (meaning this test can be used) for the duration of the COVID-19 declaration under Section 564(b)(1) of the Act, 21 U.S.C.section 360bbb-3(b)(1), unless the authorization is terminated  or revoked sooner.       Influenza A by PCR 05/16/2022 NEGATIVE  NEGATIVE Final   Influenza B by PCR 05/16/2022 NEGATIVE  NEGATIVE Final   Comment: (NOTE) The Xpert Xpress SARS-CoV-2/FLU/RSV plus assay is intended as an aid in the diagnosis of influenza from Nasopharyngeal swab specimens and should not be used as a sole basis for treatment. Nasal washings and aspirates are unacceptable for Xpert Xpress SARS-CoV-2/FLU/RSV testing.  Fact Sheet for Patients: EntrepreneurPulse.com.au  Fact Sheet for Healthcare Providers: IncredibleEmployment.be  This test is not yet approved or cleared by the [place] FDA and has been authorized for detection and/or diagnosis of SARS-CoV-2 by FDA under an Emergency Use Authorization (EUA). This EUA will remain in effect (meaning this test can be used) for the duration of the COVID-19 declaration under Section 564(b)(1) of the Act, 21 U.S.C. section 360bbb-3(b)(1), unless  the authorization is terminated or revoked.  Performed at [facility], [place] [address], [place], [place] [address]    WBC 05/16/2022 7.1  4.0 - 10.5 K/uL Final   RBC 05/16/2022 5.36  4.22 - 5.81 MIL/uL Final   Hemoglobin 05/16/2022 15.4  13.0 - 17.0 g/dL Final   HCT 05/16/2022 45.3  39.0 - 52.0 % Final   MCV 05/16/2022 84.5  80.0 - 100.0 fL Final   MCH 05/16/2022 28.7  26.0 - 34.0 pg Final   MCHC 05/16/2022 34.0  30.0 - 36.0 g/dL Final   RDW 05/16/2022 13.0  11.5 - 15.5 % Final   Platelets 05/16/2022 262  150 - 400 K/uL Final   nRBC 05/16/2022 0.0  0.0 - 0.2 % Final   Neutrophils Relative % 05/16/2022 51  % Final   Neutro Abs 05/16/2022 3.6  1.7 - 7.7 K/uL Final   Lymphocytes Relative 05/16/2022 33  % Final   Lymphs Abs 05/16/2022 2.3  0.7 - 4.0 K/uL Final   Monocytes Relative 05/16/2022 7  % Final   Monocytes Absolute 05/16/2022 0.5  0.1 - 1.0 K/uL Final   Eosinophils Relative 05/16/2022 8  % Final   Eosinophils Absolute 05/16/2022 0.6 (H)  0.0 - 0.5 K/uL Final   Basophils Relative 05/16/2022 1  % Final   Basophils Absolute 05/16/2022 0.1  0.0 - 0.1 K/uL Final   Immature Granulocytes 05/16/2022 0  % Final   Abs Immature Granulocytes 05/16/2022 0.02  0.00 - 0.07 K/uL Final   Performed at [facility], [place] [address], [place], [place] [address]   Sodium 05/16/2022 140  135 - 145 mmol/L Final   Potassium 05/16/2022 4.8  3.5 - 5.1 mmol/L Final   Chloride 05/16/2022 100  98 - 111 mmol/L Final   CO2 05/16/2022 20 (L)  22 - 32 mmol/L Final   Glucose, Bld 05/16/2022 144 (H)  70 - 99 mg/dL Final   Glucose reference range applies only to samples taken after fasting for at least 8 hours.   BUN 05/16/2022 16  6 - 20 mg/dL Final   Creatinine, Ser 05/16/2022 0.92  0.61 - 1.24 mg/dL Final   Calcium 05/16/2022 10.3  8.9 - 10.3 mg/dL Final   Total Protein  05/16/2022 6.8  6.5 - 8.1 g/dL Final   Albumin 05/16/2022 4.5  3.5 - 5.0 g/dL Final   AST 05/16/2022 30  15 - 41 U/L Final    ALT 05/16/2022 42  0 - 44 U/L Final   Alkaline Phosphatase 05/16/2022 61  38 - 126 U/L Final   Total Bilirubin 05/16/2022 0.5  0.3 - 1.2 mg/dL Final   GFR, Estimated 05/16/2022 >60  >60 mL/min Final   Comment: (NOTE) Calculated using the CKD-EPI Creatinine Equation (2021)    Anion gap 05/16/2022 20 (H)  5 - 15 Final   Performed at [facility], [place] [address], [place], [place] [address]   Alcohol, Ethyl (B) 05/16/2022 36 (H)  <10 mg/dL Final   Comment: (NOTE) Lowest detectable limit for serum alcohol is 10 mg/dL.  For medical purposes only. Performed at [facility], [place] [address], [place], [place] [address]    Cholesterol 05/16/2022 126  0 - 200 mg/dL Final   Triglycerides 05/16/2022 219 (H)  <150 mg/dL Final   HDL 05/16/2022 45  >40 mg/dL Final   Total CHOL/HDL Ratio 05/16/2022 2.8  RATIO Final   VLDL 05/16/2022 44 (H)  0 - 40 mg/dL Final   LDL Cholesterol 05/16/2022 37  0 - 99 mg/dL Final   Comment:        Total Cholesterol/HDL:CHD Risk Coronary Heart Disease Risk Table                     Men   Women  1/2 Average Risk   3.4   3.3  Average Risk       5.0   4.4  2 X Average Risk   9.6   7.1  3 X Average Risk  23.4   11.0        Use the calculated Patient Ratio above and the CHD Risk Table to determine the patient's CHD Risk.        ATP III CLASSIFICATION (LDL):  <100     mg/dL   Optimal  100-129  mg/dL   Near or Above                    Optimal  130-159  mg/dL   Borderline  160-189  mg/dL   High  >190     mg/dL   Very High Performed at [facility] [address] [place] [address] [place], [place] [address]    TSH 05/16/2022 1.742  0.350 - 4.500 uIU/mL Final   Comment: Performed by a 3rd Generation assay with a functional sensitivity of <=0.01 uIU/mL. Performed at [facility], [place] [address] [place], [place] [address]    POC Amphetamine UR 05/16/2022 None Detected  NONE DETECTED (Cut Off Level 1000 ng/mL) Final   POC Secobarbital (BAR) 05/16/2022 None  Detected  NONE DETECTED (Cut Off Level 300 ng/mL) Final   POC Buprenorphine (BUP) 05/16/2022 None Detected  NONE DETECTED (Cut Off Level 10 ng/mL) Final   POC Oxazepam (BZO) 05/16/2022 None Detected  NONE DETECTED (Cut Off Level 300 ng/mL) Final   POC Cocaine UR 05/16/2022 None Detected  NONE DETECTED (Cut Off Level 300 ng/mL) Final   POC Methamphetamine UR 05/16/2022 None Detected  NONE DETECTED (Cut Off Level 1000 ng/mL) Final   POC Morphine 05/16/2022 None Detected  NONE DETECTED (Cut Off Level 300 ng/mL) Final   POC Methadone UR 05/16/2022 None Detected  NONE DETECTED (Cut Off Level 300 ng/mL) Final  POC Oxycodone UR 05/16/2022 None Detected  NONE DETECTED (Cut Off Level 100 ng/mL) Final   POC Marijuana UR 05/16/2022 None Detected  NONE DETECTED (Cut Off Level 50 ng/mL) Final   SARSCOV2ONAVIRUS 2 AG 05/16/2022 NEGATIVE  NEGATIVE Final   Comment: (NOTE) SARS-CoV-2 antigen NOT DETECTED.   Negative results are presumptive.  Negative results do not preclude SARS-CoV-2 infection and should not be used as the sole basis for treatment or other patient management decisions, including infection  control decisions, particularly in the presence of clinical signs and  symptoms consistent with COVID-19, or in those who have been in contact with the virus.  Negative results must be combined with clinical observations, patient history, and epidemiological information. The expected result is Negative.  Fact Sheet for Patients: HandmadeRecipes.com.cy  Fact Sheet for Healthcare Providers: FuneralLife.at  This test is not yet approved or cleared by the [place] FDA and  has been authorized for detection and/or diagnosis of SARS-CoV-2 by FDA under an Emergency Use Authorization (EUA).  This EUA will remain in effect (meaning this test can be used) for the duration of  the COV                          ID-19 declaration under Section 564(b)(1) of the  Act, 21 U.S.C. section 360bbb-3(b)(1), unless the authorization is terminated or revoked sooner.    Office Visit on 03/06/2022  Component Date Value Ref Range Status   Testosterone 03/06/2022 261 (L)  264 - 916 ng/dL Final   Comment: Adult male reference interval is based on a population of healthy nonobese males (BMI <30) between 20 and 53 years old. [place], [place] [phone]. PMID: [id number].    Vit D, 25-Hydroxy 03/06/2022 40.1  30.0 - 100.0 ng/mL Final   Comment: Vitamin D deficiency has been defined by the [place] practice guideline as a level of serum 25-OH vitamin D less than 20 ng/mL (1,2). The Endocrine Society went on to further define vitamin D insufficiency as a level between 21 and 29 ng/mL (2). 1. IOM (Institute of Medicine). 2010. Dietary reference    intakes for calcium and D. [place]: The    [employer]. 2. [place] MF, [place], Bischoff-Ferrari HA, et al.    Evaluation, treatment, and prevention of vitamin D    deficiency: an Endocrine Society clinical practice    guideline. JCEM. 2011 Jul; 96(7):1911-30.    Vitamin B-12 03/06/2022 543  232 - 1,245 pg/mL Final  Admission on 01/20/2022, Discharged on 01/21/2022  Component Date Value Ref Range Status   SARS Coronavirus 2 by RT PCR 01/20/2022 NEGATIVE  NEGATIVE Final   Comment: (NOTE) SARS-CoV-2 target nucleic acids are NOT DETECTED.  The SARS-CoV-2 RNA is generally detectable in upper respiratory specimens during the acute phase of infection. The lowest concentration of SARS-CoV-2 viral copies this assay can detect is 138 copies/mL. A negative result does not preclude SARS-Cov-2 infection and should not be used as the sole basis for treatment or other patient management decisions. A negative result may occur with  improper specimen collection/handling, submission of specimen other than nasopharyngeal swab, presence of viral mutation(s)  within the areas targeted by this assay, and inadequate number of viral copies(<138 copies/mL). A negative result must be combined with clinical observations, patient history, and epidemiological information. The expected result is Negative.  Fact Sheet for Patients:  EntrepreneurPulse.com.au  Fact Sheet for Healthcare Providers:  IncredibleEmployment.be  This test is no                          t yet approved or cleared by the [place] and  has been authorized for detection and/or diagnosis of SARS-CoV-2 by FDA under an Emergency Use Authorization (EUA). This EUA will remain  in effect (meaning this test can be used) for the duration of the COVID-19 declaration under Section 564(b)(1) of the Act, 21 U.S.C.section 360bbb-3(b)(1), unless the authorization is terminated  or revoked sooner.       Influenza A by PCR 01/20/2022 NEGATIVE  NEGATIVE Final   Influenza B by PCR 01/20/2022 NEGATIVE  NEGATIVE Final   Comment: (NOTE) The Xpert Xpress SARS-CoV-2/FLU/RSV plus assay is intended as an aid in the diagnosis of influenza from Nasopharyngeal swab specimens and should not be used as a sole basis for treatment. Nasal washings and aspirates are unacceptable for Xpert Xpress SARS-CoV-2/FLU/RSV testing.  Fact Sheet for Patients: EntrepreneurPulse.com.au  Fact Sheet for Healthcare Providers: IncredibleEmployment.be  This test is not yet approved or cleared by the [place] FDA and has been authorized for detection and/or diagnosis of SARS-CoV-2 by FDA under an Emergency Use Authorization (EUA). This EUA will remain in effect (meaning this test can be used) for the duration of the COVID-19 declaration under Section 564(b)(1) of the Act, 21 U.S.C. section 360bbb-3(b)(1), unless the authorization is terminated or revoked.  Performed at [facility], [place] [address] [place], [place] [address]     Sodium 01/20/2022 139  135 - 145 mmol/L Final   Potassium 01/20/2022 4.2  3.5 - 5.1 mmol/L Final   Chloride 01/20/2022 107  98 - 111 mmol/L Final   CO2 01/20/2022 23  22 - 32 mmol/L Final   Glucose, Bld 01/20/2022 102 (H)  70 - 99 mg/dL Final   Glucose reference range applies only to samples taken after fasting for at least 8 hours.   BUN 01/20/2022 15  6 - 20 mg/dL Final   Creatinine, Ser 01/20/2022 0.93  0.61 - 1.24 mg/dL Final   Calcium 01/20/2022 9.3  8.9 - 10.3 mg/dL Final   Total Protein 01/20/2022 6.2 (L)  6.5 - 8.1 g/dL Final   Albumin 01/20/2022 4.0  3.5 - 5.0 g/dL Final   AST 01/20/2022 22  15 - 41 U/L Final   ALT 01/20/2022 35  0 - 44 U/L Final   Alkaline Phosphatase 01/20/2022 69  38 - 126 U/L Final   Total Bilirubin 01/20/2022 0.2 (L)  0.3 - 1.2 mg/dL Final   GFR, Estimated 01/20/2022 >60  >60 mL/min Final   Comment: (NOTE) Calculated using the CKD-EPI Creatinine Equation (2021)    Anion gap 01/20/2022 9  5 - 15 Final   Performed at [facility] [address], [place], [place] [address]   Hgb A1c MFr Bld 01/20/2022 7.0 (H)  4.8 - 5.6 % Final   Comment: (NOTE) Pre diabetes:          5.7%-6.4%  Diabetes:              >6.4%  Glycemic control for   <7.0% adults with diabetes    Mean Plasma Glucose 01/20/2022 154.2  mg/dL Final   Performed at [facility], [place] [address] [place], [place] [address]   Alcohol, Ethyl (B) 01/20/2022 <10  <10 mg/dL Final   Comment: (NOTE) Lowest detectable limit for serum alcohol is 10 mg/dL.  For medical purposes only. Performed at [facility], [place] [address], [place], [place] [address]    Cholesterol 01/20/2022 122  0 - 200 mg/dL Final   Triglycerides 01/20/2022 150 (H)  <150 mg/dL Final   HDL 01/20/2022 36 (L)  >40 mg/dL Final   Total CHOL/HDL Ratio 01/20/2022 3.4  RATIO Final   VLDL 01/20/2022 30  0 - 40 mg/dL Final   LDL Cholesterol 01/20/2022 56  0 - 99 mg/dL Final   Comment:        Total  Cholesterol/HDL:CHD Risk Coronary Heart Disease Risk Table                     Men   Women  1/2 Average Risk   3.4   3.3  Average Risk       5.0   4.4  2 X Average Risk   9.6   7.1  3 X Average Risk  23.4   11.0        Use the calculated Patient Ratio above and the CHD Risk Table to determine the patient's CHD Risk.        ATP III CLASSIFICATION (LDL):  <100     mg/dL   Optimal  100-129  mg/dL   Near or Above                    Optimal  130-159  mg/dL   Borderline  160-189  mg/dL   High  >190     mg/dL   Very High Performed at [facility] [address], [place], [place] [address]    TSH 01/20/2022 1.184  0.350 - 4.500 uIU/mL Final   Comment: Performed by a 3rd Generation assay with a functional sensitivity of <=0.01 uIU/mL. Performed at [facility], [place] [address] [place], [place] [address]    POC Amphetamine UR 01/20/2022 None Detected  NONE DETECTED (Cut Off Level 1000 ng/mL) Final   POC Secobarbital (BAR) 01/20/2022 None Detected  NONE DETECTED (Cut Off Level 300 ng/mL) Final   POC Buprenorphine (BUP) 01/20/2022 None Detected  NONE DETECTED (Cut Off Level 10 ng/mL) Final   POC Oxazepam (BZO) 01/20/2022 Positive (A)  NONE DETECTED (Cut Off Level 300 ng/mL) Final   POC Cocaine UR 01/20/2022 None Detected  NONE DETECTED (Cut Off Level 300 ng/mL) Final   POC Methamphetamine UR 01/20/2022 None Detected  NONE DETECTED (Cut Off Level 1000 ng/mL) Final   POC Morphine 01/20/2022 None Detected  NONE DETECTED (Cut Off Level 300 ng/mL) Final   POC Methadone UR 01/20/2022 None Detected  NONE DETECTED (Cut Off Level 300 ng/mL) Final   POC Oxycodone UR 01/20/2022 None Detected  NONE DETECTED (Cut Off Level 100 ng/mL) Final   POC Marijuana UR 01/20/2022 None Detected  NONE DETECTED (Cut Off Level 50 ng/mL) Final   SARSCOV2ONAVIRUS 2 AG 01/20/2022 NEGATIVE  NEGATIVE Final   Comment: (NOTE) SARS-CoV-2 antigen NOT DETECTED.   Negative results are presumptive.  Negative results  do not preclude SARS-CoV-2 infection and should not be used as the sole basis for treatment or other patient management decisions, including infection  control decisions, particularly in the presence of clinical signs and  symptoms consistent with COVID-19, or in those who have been in contact with the virus.  Negative results must be combined with clinical observations, patient history, and epidemiological information. The expected result is Negative.  Fact Sheet for Patients: HandmadeRecipes.com.cy  Fact Sheet for Healthcare  Providers: FuneralLife.at  This test is not yet approved or cleared by the [place] and  has been authorized for detection and/or diagnosis of SARS-CoV-2 by FDA under an Emergency Use Authorization (EUA).  This EUA will remain in effect (meaning this test can be used) for the duration of  the COV                          ID-19 declaration under Section 564(b)(1) of the Act, 21 U.S.C. section 360bbb-3(b)(1), unless the authorization is terminated or revoked sooner.    Office Visit on 01/08/2022  Component Date Value Ref Range Status   Hemoglobin A1C 01/08/2022 7.1 (A)  4.0 - 5.6 % Final   HbA1c POC (<> result, manual entry) 01/08/2022 7.1  4.0 - 5.6 % Final   HbA1c, POC (prediabetic range) 01/08/2022 7.1 (A)  5.7 - 6.4 % Final   HbA1c, POC (controlled diabetic ra* 01/08/2022 7.1 (A)  0.0 - 7.0 % Final   POC Glucose 01/08/2022 169 (A)  70 - 99 mg/dl Final  Office Visit on 12/04/2021  Component Date Value Ref Range Status   Hgb A1c MFr Bld 12/04/2021 7.0 (H)  4.8 - 5.6 % Final   Comment:          Prediabetes: 5.7 - 6.4          Diabetes: >6.4          Glycemic control for adults with diabetes: <7.0    Est. average glucose Bld gHb Est-m* 12/04/2021 154  mg/dL Final   WBC 12/04/2021 5.0  3.4 - 10.8 x10E3/uL Final   RBC 12/04/2021 5.09  4.14 - 5.80 x10E6/uL Final   Hemoglobin 12/04/2021 14.3  13.0 - 17.7  g/dL Final   Hematocrit 12/04/2021 43.0  37.5 - 51.0 % Final   MCV 12/04/2021 85  79 - 97 fL Final   MCH 12/04/2021 28.1  26.6 - 33.0 pg Final   MCHC 12/04/2021 33.3  31.5 - 35.7 g/dL Final   RDW 12/04/2021 12.8  11.6 - 15.4 % Final   Platelets 12/04/2021 240  150 - 450 x10E3/uL Final   Glucose 12/04/2021 157 (H)  70 - 99 mg/dL Final   BUN 12/04/2021 14  8 - 27 mg/dL Final   Creatinine, Ser 12/04/2021 1.03  0.76 - 1.27 mg/dL Final   eGFR 12/04/2021 83  >59 mL/min/1.73 Final   BUN/Creatinine Ratio 12/04/2021 14  10 - 24 Final   Sodium 12/04/2021 140  134 - 144 mmol/L Final   Potassium 12/04/2021 4.5  3.5 - 5.2 mmol/L Final   Chloride 12/04/2021 105  96 - 106 mmol/L Final   CO2 12/04/2021 22  20 - 29 mmol/L Final   Calcium 12/04/2021 9.4  8.6 - 10.2 mg/dL Final   Total Protein 12/04/2021 7.0  6.0 - 8.5 g/dL Final   Albumin 12/04/2021 4.7  3.8 - 4.9 g/dL Final   Globulin, Total 12/04/2021 2.3  1.5 - 4.5 g/dL Final   Albumin/Globulin Ratio 12/04/2021 2.0  1.2 - 2.2 Final   Bilirubin Total 12/04/2021 0.3  0.0 - 1.2 mg/dL Final   Alkaline Phosphatase 12/04/2021 74  44 - 121 IU/L Final   AST 12/04/2021 22  0 - 40 IU/L Final   ALT 12/04/2021 53 (H)  0 - 44 IU/L Final    Blood Alcohol level:  Lab Results  Component Value Date   ETH 36 (H) 05/16/2022   ETH <10 01/20/2022  Metabolic Disorder Labs: Lab Results  Component Value Date   HGBA1C 7.0 (H) 01/20/2022   MPG 154.2 01/20/2022   MPG 136.98 03/16/2020   Lab Results  Component Value Date   PROLACTIN 13.0 03/17/2020   Lab Results  Component Value Date   CHOL 126 05/16/2022   TRIG 219 (H) 05/16/2022   HDL 45 05/16/2022   CHOLHDL 2.8 05/16/2022   VLDL 44 (H) 05/16/2022   LDLCALC 37 05/16/2022   LDLCALC 56 01/20/2022    Therapeutic Lab Levels: No results found for: "LITHIUM" No results found for: "VALPROATE" No results found for: "CBMZ"  Physical Findings   AIMS    Flowsheet Row Admission (Discharged) from OP  Visit from 09/07/2019 in [place] [facility] Admission (Discharged) from OP Visit from 01/12/2019 in [place] [facility] Admission (Discharged) from OP Visit from 08/15/2018 in [place] [facility] Admission (Discharged) from OP Visit from 10/02/2017 in [place] [facility] Admission (Discharged) from 06/22/2017 in [place] [facility]  AIMS Total Score 0 0 0 0 0      AUDIT    Flowsheet Row Office Visit from 08/27/2020 in [facility] Admission (Discharged) from OP Visit from 03/18/2020 in [place] [facility] Admission (Discharged) from 12/10/2019 in [place] [facility] Admission (Discharged) from OP Visit from 09/07/2019 in [place] [facility] Admission (Discharged) from OP Visit from 01/12/2019 in [place] [facility]  Alcohol Use Disorder Identification Test Final Score (AUDIT) _0 0      GAD-7    Flowsheet Row Clinical Support from 02/24/2022 in [facility] Office Visit from 01/08/2022 in [place] Video Visit from 11/19/2021 in [place] from 07/07/2021 in [facility] Office Visit from 08/27/2020 in [facility]  Total GAD-7 Score _1 [id number]    [place] ED from 05/16/2022 in [facility] Office Visit from 03/06/2022 in [place] from 02/24/2022 in [facility] Office Visit from 01/08/2022 in [place] Video Visit from 11/19/2021 in [facility]  PHQ-2 Total Score 4 0 0 4 1  PHQ-9 Total Score 11 0 _2 Flowsheet Row ED from  05/16/2022 in [facility] ED from 01/20/2022 in [facility] ED from 10/10/2021 in [facility] [place] No Risk No Risk        Musculoskeletal  Strength & Muscle Tone: within normal limits Gait & Station: normal Patient leans: N/A  Psychiatric Specialty Exam  Presentation  General Appearance:  Appropriate for Environment  Eye Contact: Good  Speech: Clear and Coherent  Speech Volume: Normal  Handedness: -- (not assessed)   Mood and Affect  Mood: Euthymic  Affect: Appropriate   Thought Process  Thought Processes: Coherent  Descriptions of Associations:Intact  Orientation:Full (Time, Place and Person)  Thought Content:Logical  Diagnosis of Schizophrenia or Schizoaffective disorder in past: No    Hallucinations:Hallucinations: None  Ideas of Reference:None  Suicidal Thoughts:Suicidal Thoughts: No  Homicidal Thoughts:Homicidal Thoughts: No   Sensorium  Memory: Immediate Fair; Recent Fair  Judgment:  Good  Insight: Good   Executive Functions  Concentration: Good  Attention Span: Good  Recall: [place] of Knowledge: Good  Language: Good   Psychomotor Activity  Psychomotor Activity: Psychomotor Activity: Normal   Assets  Assets: Communication Skills; Housing; Resilience   Sleep  Sleep: Sleep: Good   Nutritional Assessment (For OBS and FBC admissions only) Has the patient had a weight loss or gain of 10 pounds or more in the last 3 months?: No Has the patient had a decrease in food intake/or appetite?: No Does the patient have dental problems?: No Does the patient have eating habits or behaviors that may be indicators of an eating disorder including binging or inducing vomiting?: No Has the patient recently lost weight without trying?: 0 Has the patient been eating poorly because of a decreased appetite?:  0 Malnutrition Screening Tool Score: 0    Physical Exam  Physical Exam Constitutional:      Appearance: Normal appearance.  HENT:     Head: Normocephalic and atraumatic.  Pulmonary:     Effort: Pulmonary effort is normal.  Neurological:     Mental Status: He is alert and oriented to person, place, and time.    Review of Systems  Constitutional:  Negative for chills.  Gastrointestinal:  Negative for constipation, diarrhea, nausea and vomiting.  Psychiatric/Behavioral:  Positive for substance abuse. Negative for hallucinations and suicidal ideas. The patient does not have insomnia.    Blood pressure 108/79, pulse 72, temperature 98 F (36.7 C), temperature source Oral, resp. rate 18, SpO2 98 %. There is no height or weight on file to calculate BMI.  Treatment Plan Summary: Daily contact with patient to assess and evaluate symptoms and progress in treatment and Medication management   Patient appears to be in the action stage of his Etoh and cocaine cessation. Patient Etoh use appear to be the crux of his cocaine use as well, as it was likely inducing depression and the cocaine was used to combat this. However, patient was only drinking out of the likely positive dopamine affect. Patient endorsed interest in Naltrexone, but would like for patient to get a bit further in his detox (1-2 days) before starting. Patient has never been on the medication before. Otherwise, patient will not be started on any psychotropic medications and CIWA will be monitored.   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

## 2022-05-17 NOTE — ED Notes (Signed)
Pt is in the dayroom watching TV with peers. Pt denies SI/HI/AVH. No acute distress noted. Will continue to monitor for safety. 

## 2022-05-17 NOTE — ED Notes (Signed)
Patient continues to rest with no sxs of distress noted - will continue to monitor for safety

## 2022-05-18 ENCOUNTER — Encounter (HOSPITAL_COMMUNITY): Payer: Self-pay | Admitting: Behavioral Health

## 2022-05-18 ENCOUNTER — Encounter (HOSPITAL_COMMUNITY): Payer: Self-pay

## 2022-05-18 DIAGNOSIS — F1020 Alcohol dependence, uncomplicated: Secondary | ICD-10-CM | POA: Diagnosis not present

## 2022-05-18 DIAGNOSIS — I10 Essential (primary) hypertension: Secondary | ICD-10-CM | POA: Diagnosis not present

## 2022-05-18 DIAGNOSIS — Z9151 Personal history of suicidal behavior: Secondary | ICD-10-CM | POA: Diagnosis not present

## 2022-05-18 DIAGNOSIS — E119 Type 2 diabetes mellitus without complications: Secondary | ICD-10-CM | POA: Diagnosis not present

## 2022-05-18 LAB — GLUCOSE, CAPILLARY: Glucose-Capillary: 134 mg/dL — ABNORMAL HIGH (ref 70–99)

## 2022-05-18 LAB — HEMOGLOBIN A1C
Hgb A1c MFr Bld: 6.6 % — ABNORMAL HIGH (ref 4.8–5.6)
Mean Plasma Glucose: 143 mg/dL

## 2022-05-18 MED ORDER — POLYETHYLENE GLYCOL 3350 17 G PO PACK
17.0000 g | PACK | Freq: Every day | ORAL | Status: DC
  Administered 2022-05-18 – 2022-05-20 (×3): 17 g via ORAL
  Filled 2022-05-18 (×3): qty 1

## 2022-05-18 NOTE — ED Notes (Signed)
Patient A&Ox4. Denies intent to harm self/others when asked. Denies A/VH. Patient denies any physical complaints when asked. No acute distress noted. AM medication given without difficulty.  Pt tolerated well. Routine safety checks conducted according to facility protocol. Encouraged patient to notify staff if thoughts of harm toward self or others arise. Patient verbalize understanding and agreement. Will continue to monitor for safety.

## 2022-05-18 NOTE — ED Provider Notes (Addendum)
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 decisions. A negative result may occur with  improper specimen collection/handling, submission of specimen other than nasopharyngeal swab, presence of viral mutation(s) within the areas targeted by this assay, and inadequate number of viral copies(<138 copies/mL). A negative result must be combined with clinical observations, patient history, and epidemiological information. The expected result is Negative.  Fact Sheet for Patients:   EntrepreneurPulse.com.au  Fact Sheet for Healthcare Providers:  IncredibleEmployment.be  This test is no                          t yet approved or cleared by the [place] and  has been authorized for  detection and/or diagnosis of SARS-CoV-2 by FDA under an Emergency Use Authorization (EUA). This EUA will remain  in effect (meaning this test can be used) for the duration of the COVID-19 declaration under Section 564(b)(1) of the Act, 21 U.S.C.section 360bbb-3(b)(1), unless the authorization is terminated  or revoked sooner.       Influenza A by PCR 01/20/2022 NEGATIVE  NEGATIVE Final   Influenza B by PCR 01/20/2022 NEGATIVE  NEGATIVE Final   Comment: (NOTE) The Xpert Xpress SARS-CoV-2/FLU/RSV plus assay is intended as an aid in the diagnosis of influenza from Nasopharyngeal swab specimens and should not be used as a sole basis for treatment. Nasal washings and aspirates are unacceptable for Xpert Xpress SARS-CoV-2/FLU/RSV testing.  Fact Sheet for Patients: EntrepreneurPulse.com.au  Fact Sheet for Healthcare Providers: IncredibleEmployment.be  This test is not yet approved or cleared by the [place] FDA and has been authorized for detection and/or diagnosis of SARS-CoV-2 by FDA under an Emergency Use Authorization (EUA). This EUA will remain in effect (meaning this test can be used) for the duration of the COVID-19 declaration under Section 564(b)(1) of the Act, 21 U.S.C. section 360bbb-3(b)(1), unless the authorization is terminated or revoked.  Performed at [facility], [place] [address] [place], [place] [address]    Sodium 01/20/2022 139  135 - 145 mmol/L Final   Potassium 01/20/2022 4.2  3.5 - 5.1 mmol/L Final   Chloride 01/20/2022 107  98 - 111 mmol/L Final   CO2 01/20/2022 23  22 - 32 mmol/L Final   Glucose, Bld 01/20/2022 102 (H)  70 - 99 mg/dL Final   Glucose reference  range applies only to samples taken after fasting for at least 8 hours.   BUN 01/20/2022 15  6 - 20 mg/dL Final   Creatinine, Ser 01/20/2022 0.93  0.61 - 1.24 mg/dL Final   Calcium 01/20/2022 9.3  8.9 - 10.3 mg/dL Final   Total Protein 01/20/2022 6.2 (L)  6.5 - 8.1 g/dL Final   Albumin 01/20/2022 4.0  3.5 - 5.0 g/dL Final   AST 01/20/2022 22  15 - 41 U/L Final   ALT 01/20/2022 35  0 - 44 U/L Final   Alkaline Phosphatase 01/20/2022 69  38 - 126 U/L Final   Total Bilirubin 01/20/2022 0.2 (L)  0.3 - 1.2 mg/dL Final   GFR, Estimated 01/20/2022 >60  >60 mL/min Final   Comment: (NOTE) Calculated using the CKD-EPI Creatinine Equation (2021)    Anion gap 01/20/2022 9  5 - 15 Final   Performed at [facility] [address] [place], [place] [address]   Hgb A1c MFr Bld 01/20/2022 7.0 (H)  4.8 - 5.6 % Final   Comment: (NOTE) Pre diabetes:          5.7%-6.4%  Diabetes:              >6.4%  Glycemic control for   <7.0% adults with diabetes    Mean Plasma Glucose 01/20/2022 154.2  mg/dL Final   Performed at [facility], [place] [address] [place], [place] [address]   Alcohol, Ethyl (B) 01/20/2022 <10  <10 mg/dL Final   Comment: (NOTE) Lowest detectable limit for serum alcohol is 10 mg/dL.  For medical purposes only. Performed at [name], [place] [address], [place], [place] [address]    Cholesterol 01/20/2022 122  0 - 200 mg/dL Final   Triglycerides 01/20/2022 150 (H)  <150 mg/dL Final   HDL 01/20/2022 36 (L)  >  40 mg/dL Final   Total CHOL/HDL Ratio 01/20/2022 3.4  RATIO Final   VLDL 01/20/2022 30  0 - 40 mg/dL Final   LDL Cholesterol 01/20/2022 56  0 - 99 mg/dL Final   Comment:        Total Cholesterol/HDL:CHD Risk Coronary Heart Disease Risk Table                     Men   Women  1/2 Average Risk   3.4   3.3  Average Risk       5.0   4.4  2 X Average Risk   9.6   7.1  3 X Average Risk  23.4   11.0        Use the calculated Patient Ratio above and the CHD Risk  Table to determine the patient's CHD Risk.        ATP III CLASSIFICATION (LDL):  <100     mg/dL   Optimal  100-129  mg/dL   Near or Above                    Optimal  130-159  mg/dL   Borderline  160-189  mg/dL   High  >190     mg/dL   Very High Performed at [facility] [address] [place], [place] [address]    TSH 01/20/2022 1.184  0.350 - 4.500 uIU/mL Final   Comment: Performed by a 3rd Generation assay with a functional sensitivity of <=0.01 uIU/mL. Performed at [facility], [place] [address], [place], [place] [address]    POC Amphetamine UR 01/20/2022 None Detected  NONE DETECTED (Cut Off Level 1000 ng/mL) Final   POC Secobarbital (BAR) 01/20/2022 None Detected  NONE DETECTED (Cut Off Level 300 ng/mL) Final   POC Buprenorphine (BUP) 01/20/2022 None Detected  NONE DETECTED (Cut Off Level 10 ng/mL) Final   POC Oxazepam (BZO) 01/20/2022 Positive (A)  NONE DETECTED (Cut Off Level 300 ng/mL) Final   POC Cocaine UR 01/20/2022 None Detected  NONE DETECTED (Cut Off Level 300 ng/mL) Final   POC Methamphetamine UR 01/20/2022 None Detected  NONE DETECTED (Cut Off Level 1000 ng/mL) Final   POC Morphine 01/20/2022 None Detected  NONE DETECTED (Cut Off Level 300 ng/mL) Final   POC Methadone UR 01/20/2022 None Detected  NONE DETECTED (Cut Off Level 300 ng/mL) Final   POC Oxycodone UR 01/20/2022 None Detected  NONE DETECTED (Cut Off Level 100 ng/mL) Final   POC Marijuana UR 01/20/2022 None Detected  NONE DETECTED (Cut Off Level 50 ng/mL) Final   SARSCOV2ONAVIRUS 2 AG 01/20/2022 NEGATIVE  NEGATIVE Final   Comment: (NOTE) SARS-CoV-2 antigen NOT DETECTED.   Negative results are presumptive.  Negative results do not preclude SARS-CoV-2 infection and should not be used as the sole basis for treatment or other patient management decisions, including infection  control decisions, particularly in the presence of clinical signs and  symptoms consistent with COVID-19, or in those who have  been in contact with the virus.  Negative results must be combined with clinical observations, patient history, and epidemiological information. The expected result is Negative.  Fact Sheet for Patients: HandmadeRecipes.com.cy  Fact Sheet for Healthcare Providers: FuneralLife.at  This test is not yet approved or cleared by the [place] FDA and  has been authorized for detection and/or diagnosis of SARS-CoV-2 by FDA under an Emergency Use Authorization (EUA).  This EUA will remain in effect (meaning this test can  be used) for the duration of  the COV                          ID-19 declaration under Section 564(b)(1) of the Act, 21 U.S.C. section 360bbb-3(b)(1), unless the authorization is terminated or revoked sooner.    Office Visit on 01/08/2022  Component Date Value Ref Range Status   Hemoglobin A1C 01/08/2022 7.1 (A)  4.0 - 5.6 % Final   HbA1c POC (<> result, manual entry) 01/08/2022 7.1  4.0 - 5.6 % Final   HbA1c, POC (prediabetic range) 01/08/2022 7.1 (A)  5.7 - 6.4 % Final   HbA1c, POC (controlled diabetic ra* 01/08/2022 7.1 (A)  0.0 - 7.0 % Final   POC Glucose 01/08/2022 169 (A)  70 - 99 mg/dl Final  Office Visit on 12/04/2021  Component Date Value Ref Range Status   Hgb A1c MFr Bld 12/04/2021 7.0 (H)  4.8 - 5.6 % Final   Comment:          Prediabetes: 5.7 - 6.4          Diabetes: >6.4          Glycemic control for adults with diabetes: <7.0    Est. average glucose Bld gHb Est-m* 12/04/2021 154  mg/dL Final   WBC 12/04/2021 5.0  3.4 - 10.8 x10E3/uL Final   RBC 12/04/2021 5.09  4.14 - 5.80 x10E6/uL Final   Hemoglobin 12/04/2021 14.3  13.0 - 17.7 g/dL Final   Hematocrit 12/04/2021 43.0  37.5 - 51.0 % Final   MCV 12/04/2021 85  79 - 97 fL Final   MCH 12/04/2021 28.1  26.6 - 33.0 pg Final   MCHC 12/04/2021 33.3  31.5 - 35.7 g/dL Final   RDW 12/04/2021 12.8  11.6 - 15.4 % Final   Platelets 12/04/2021 240  150 - 450  x10E3/uL Final   Glucose 12/04/2021 157 (H)  70 - 99 mg/dL Final   BUN 12/04/2021 14  8 - 27 mg/dL Final   Creatinine, Ser 12/04/2021 1.03  0.76 - 1.27 mg/dL Final   eGFR 12/04/2021 83  >59 mL/min/1.73 Final   BUN/Creatinine Ratio 12/04/2021 14  10 - 24 Final   Sodium 12/04/2021 140  134 - 144 mmol/L Final   Potassium 12/04/2021 4.5  3.5 - 5.2 mmol/L Final   Chloride 12/04/2021 105  96 - 106 mmol/L Final   CO2 12/04/2021 22  20 - 29 mmol/L Final   Calcium 12/04/2021 9.4  8.6 - 10.2 mg/dL Final   Total Protein 12/04/2021 7.0  6.0 - 8.5 g/dL Final   Albumin 12/04/2021 4.7  3.8 - 4.9 g/dL Final   Globulin, Total 12/04/2021 2.3  1.5 - 4.5 g/dL Final   Albumin/Globulin Ratio 12/04/2021 2.0  1.2 - 2.2 Final   Bilirubin Total 12/04/2021 0.3  0.0 - 1.2 mg/dL Final   Alkaline Phosphatase 12/04/2021 74  44 - 121 IU/L Final   AST 12/04/2021 22  0 - 40 IU/L Final   ALT 12/04/2021 53 (H)  0 - 44 IU/L Final    Blood Alcohol level:  Lab Results  Component Value Date   ETH 36 (H) 05/16/2022   ETH <10 84/16/6063    Metabolic Disorder Labs: Lab Results  Component Value Date   HGBA1C 6.6 (H) 05/16/2022   MPG 143 05/16/2022   MPG 154.2 01/20/2022   Lab Results  Component Value Date   PROLACTIN 13.0 03/17/2020   Lab Results  Component Value  Date   CHOL 126 05/16/2022   TRIG 219 (H) 05/16/2022   HDL 45 05/16/2022   CHOLHDL 2.8 05/16/2022   VLDL 44 (H) 05/16/2022   LDLCALC 37 05/16/2022   LDLCALC 56 01/20/2022    Therapeutic Lab Levels: No results found for: "LITHIUM" No results found for: "VALPROATE" No results found for: "CBMZ"  Physical Findings   AIMS    Flowsheet Row Admission (Discharged) from OP Visit from 09/07/2019 in [facility] Admission (Discharged) from OP Visit from 01/12/2019 in [facility] Admission (Discharged) from OP Visit from 08/15/2018 in [facility] Admission  (Discharged) from OP Visit from 10/02/2017 in [facility] Admission (Discharged) from 06/22/2017 in [facility]  AIMS Total Score 0 0 0 0 0      AUDIT    Flowsheet Row Office Visit from 08/27/2020 in [facility] Admission (Discharged) from OP Visit from 03/18/2020 in [facility] Admission (Discharged) from 12/10/2019 in [facility] Admission (Discharged) from OP Visit from 09/07/2019 in [facility] Admission (Discharged) from OP Visit from 01/12/2019 in [facility]  Alcohol Use Disorder Identification Test Final Score (AUDIT) _0 0      GAD-7    Flowsheet Row Clinical Support from 02/24/2022 in [facility] Office Visit from 01/08/2022 in [facility] Video Visit from 11/19/2021 in [place] from 07/07/2021 in [facility] Office Visit from 08/27/2020 in [facility]  Total GAD-7 Score _1 OHY0-7    [place] ED from 05/16/2022 in [facility] Office Visit from 03/06/2022 in [place] from 02/24/2022 in [facility] Office Visit from 01/08/2022 in [place] Video Visit from 11/19/2021 in [facility]  PHQ-2 Total Score 4 0 0 4 1  PHQ-9 Total Score 11 0 _2 Flowsheet Row ED from 05/16/2022 in [facility] ED from 01/20/2022 in [facility] ED from 10/10/2021 in [facility] No Risk No Risk        Musculoskeletal  Strength & Muscle Tone: within  normal limits Gait & Station: normal Patient leans: N/A  Psychiatric Specialty Exam  Presentation  General Appearance:  Appropriate for Environment  Eye Contact: Good  Speech: Clear and Coherent  Speech Volume: Normal  Handedness: -- (not assessed)   Mood and Affect  Mood: Euthymic  Affect: Appropriate   Thought Process  Thought Processes: Coherent  Descriptions of Associations:Intact  Orientation:Full (Time, Place and Person)  Thought Content:Logical  Diagnosis of Schizophrenia or Schizoaffective disorder in past: No    Hallucinations:Hallucinations: None  Ideas of Reference:None  Suicidal Thoughts:Suicidal Thoughts: No  Homicidal Thoughts:Homicidal Thoughts: No   Sensorium  Memory: Immediate Fair; Recent Fair  Judgment: Fair  Insight: Fair   Executive Functions  Concentration: Good  Attention Span: Good  Recall: [place] of Knowledge: Good  Language: Good   Psychomotor Activity  Psychomotor Activity: Psychomotor Activity: Normal   Assets  Assets: Communication Skills; Housing; Resilience  Sleep  Sleep: Sleep: Fair   Physical Exam  Physical Exam Constitutional:      General: He is not in acute distress.    Appearance: Normal appearance. He is not ill-appearing, toxic-appearing or diaphoretic.  HENT:     Head: Normocephalic and atraumatic.  Pulmonary:     Effort: Pulmonary effort is normal.  Neurological:     Mental Status: He is alert.    Review of Systems  Constitutional:  Negative for chills.  Respiratory:  Negative for shortness of breath.   Cardiovascular:  Negative for chest pain.  Gastrointestinal:  Negative for constipation, diarrhea, nausea and vomiting.   Blood pressure (!) 133/91, pulse 74, temperature 98.4 F (36.9 C), temperature source Oral, resp. rate 18, SpO2 100 %. There is no height or weight on file to calculate BMI.  Treatment Plan Summary: Daily contact with patient to assess  and evaluate symptoms and progress in treatment and Medication management   Patient appears to be in the action stage of his Etoh and cocaine cessation. Patient Etoh use appear to be the crux of his cocaine use as well, as it was likely inducing depression and the cocaine was used to combat this. However, patient was only drinking out of the likely positive dopamine affect. Initially patient endorsed interest in Naltrexone, however declined starting it at this time. Patient has never been on the medication before. Monitoring with CIWA with scheduled ativan. Tolerating detox without sxs at this time.  

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
## 2022-05-18 NOTE — ED Notes (Signed)
Pt is in the bed sleeping. Respirations are even and unlabored. No acute distress noted. Will continue to monitor for safety. 

## 2022-05-18 NOTE — ED Notes (Signed)
Pt sleeping@this time. Breathing even and unlabored. Will continue to monitor for safety 

## 2022-05-18 NOTE — ED Notes (Signed)
Pt sleeping in no acute distress. RR even and unlabored. Environment secured. Will continue to monitor for safety. 

## 2022-05-18 NOTE — ED Notes (Signed)
No pain or discomfort noted/ reported. Pt alert, oriented, and ambulatory.  Breathing is even and  unlabored.  Will continue to monitor for safety.

## 2022-05-18 NOTE — BH IP Treatment Plan (Signed)
Interdisciplinary Treatment and Diagnostic Plan Update  05/18/2022 Time of Session: 10:06AM [name] MRN: [id number]  Diagnosis:  Final diagnoses:  Uncomplicated alcohol dependence ([place])  Type 2 diabetes mellitus without complication, without long-term current use of insulin ([name])  Essential hypertension     Current Medications:  Current Facility-Administered Medications  Medication Dose Route Frequency Provider Last Rate Last Admin   acetaminophen (TYLENOL) tablet 650 mg  650 mg Oral Q6H PRN White, Patrice L, NP   650 mg at 05/17/22 1048   alum & mag hydroxide-simeth (MAALOX/MYLANTA) 200-200-20 MG/5ML suspension 30 mL  30 mL Oral Q4H PRN White, Patrice L, NP       amLODipine (NORVASC) tablet 10 mg  10 mg Oral Daily White, Patrice L, NP   10 mg at 05/18/22 0814   aspirin EC tablet 81 mg  81 mg Oral Daily White, Patrice L, NP   81 mg at 05/18/22 0932   clopidogrel (PLAVIX) tablet 75 mg  75 mg Oral Daily White, Patrice L, NP   75 mg at 05/18/22 0933   ezetimibe (ZETIA) tablet 10 mg  10 mg Oral Daily Damita Dunnings B, MD   10 mg at 05/18/22 0932   hydrOXYzine (ATARAX) tablet 25 mg  25 mg Oral TID PRN White, Patrice L, NP       loperamide (IMODIUM) capsule 2-4 mg  2-4 mg Oral PRN White, Patrice L, NP       LORazepam (ATIVAN) tablet 1 mg  1 mg Oral Q6H PRN White, Patrice L, NP       LORazepam (ATIVAN) tablet 1 mg  1 mg Oral BID White, Patrice L, NP       Followed by   Derrill Memo ON 05/20/2022] LORazepam (ATIVAN) tablet 1 mg  1 mg Oral Daily White, Patrice L, NP       magnesium hydroxide (MILK OF MAGNESIA) suspension 30 mL  30 mL Oral Daily PRN White, Patrice L, NP       metFORMIN (GLUCOPHAGE) tablet 1,000 mg  1,000 mg Oral BID WC White, Patrice L, NP   1,000 mg at 05/18/22 0933   metoprolol tartrate (LOPRESSOR) tablet 100 mg  100 mg Oral BID White, Patrice L, NP   100 mg at 05/18/22 0932   multivitamin with minerals tablet 1 tablet  1 tablet Oral Daily White, Patrice L, NP   1 tablet at  05/18/22 0933   nitroGLYCERIN (NITROSTAT) SL tablet 0.4 mg  0.4 mg Sublingual Q5 Min x 3 PRN White, Patrice L, NP       ondansetron (ZOFRAN-ODT) disintegrating tablet 4 mg  4 mg Oral Q6H PRN White, Patrice L, NP       thiamine (VITAMIN B1) tablet 100 mg  100 mg Oral Daily White, Patrice L, NP   100 mg at 05/18/22 4818   Current Outpatient Medications  Medication Sig Dispense Refill   Accu-Chek Softclix Lancets lancets SMARTSIG:1 Topical 4 Times Daily     amLODipine (NORVASC) 10 MG tablet Take 1 tablet by mouth once daily 90 tablet 3   aspirin 81 MG EC tablet Take 1 tablet (81 mg total) by mouth daily. (May buy from over the counter): Swallow whole for heart health (Patient taking differently: Take 81 mg by mouth at bedtime. (May buy from over the counter): Swallow whole for heart health) 30 tablet 0   blood glucose meter kit and supplies KIT Dispense based on patient and insurance preference. Use up to four times daily as directed. (FOR ICD-9  250.00, 250.01). 1 each 0   Blood Pressure Monitor KIT 1 kit by Does not apply route daily. 1 kit 0   cholecalciferol (VITAMIN D3) 25 MCG (1000 UNIT) tablet Take 5,000 Units by mouth every other day.     clopidogrel (PLAVIX) 75 MG tablet Take 1 tablet by mouth once daily 90 tablet 3   ezetimibe (ZETIA) 10 MG tablet Take 1 tablet (10 mg total) by mouth daily. 30 tablet 2   glucose blood (ACCU-CHEK GUIDE) test strip USE AS DIRECTED to check blood sugars three times a day. 300 each 0   LIVALO 4 MG TABS Take 1 tablet by mouth once daily 90 tablet 3   metFORMIN (GLUCOPHAGE) 1000 MG tablet Take 1 tablet (1,000 mg total) by mouth 2 (two) times daily with a meal. 180 tablet 3   metoprolol tartrate (LOPRESSOR) 100 MG tablet Take 1 tablet (100 mg total) by mouth 2 (two) times daily. 180 tablet 3   Polyethyl Glycol-Propyl Glycol (SYSTANE) 0.4-0.3 % GEL ophthalmic gel Place 1 Application into both eyes daily as needed (For dry eyes).     sildenafil (VIAGRA) 100 MG  tablet Take 1 tablet (100 mg total) by mouth daily as needed. 10 tablet 5   Testosterone (ANDROGEL PUMP) 20.25 MG/ACT (1.62%) GEL Place 1 Pump onto the skin daily. 75 g 2   cyanocobalamin 1000 MCG tablet Take 1,000 mcg by mouth daily.     EPINEPHrine 0.3 mg/0.3 mL IJ SOAJ injection Inject 0.3 mg into the muscle as needed for anaphylaxis. 1 each 2   nitroGLYCERIN (NITROSTAT) 0.4 MG SL tablet Place 1 tablet (0.4 mg total) under the tongue every 5 (five) minutes x 3 doses as needed for chest pain. 25 tablet 3   PTA Medications: Prior to Admission medications   Medication Sig Start Date End Date Taking? Authorizing Provider  Accu-Chek Softclix Lancets lancets SMARTSIG:1 Topical 4 Times Daily 12/14/20  Yes [provider]  amLODipine (NORVASC) 10 MG tablet Take 1 tablet by mouth once daily 02/04/22  Yes Hilty, Nadean Corwin, MD  aspirin 81 MG EC tablet Take 1 tablet (81 mg total) by mouth daily. (May buy from over the counter): Swallow whole for heart health Patient taking differently: Take 81 mg by mouth at bedtime. (May buy from over the counter): Swallow whole for heart health 07/10/21  Yes Ival Bible, MD  blood glucose meter kit and supplies KIT Dispense based on patient and insurance preference. Use up to four times daily as directed. (FOR ICD-9 250.00, 250.01). 12/14/20  Yes Hagler, Aaron Edelman, MD  Blood Pressure Monitor KIT 1 kit by Does not apply route daily. 02/12/21  Yes Vevelyn Francois, NP  cholecalciferol (VITAMIN D3) 25 MCG (1000 UNIT) tablet Take 5,000 Units by mouth every other day.   Yes [provider]  clopidogrel (PLAVIX) 75 MG tablet Take 1 tablet by mouth once daily 02/20/22  Yes Hilty, Nadean Corwin, MD  ezetimibe (ZETIA) 10 MG tablet Take 1 tablet (10 mg total) by mouth daily. 03/06/22 06/04/22 Yes Fenton Foy, NP  glucose blood (ACCU-CHEK GUIDE) test strip USE AS DIRECTED to check blood sugars three times a day. 04/30/22  Yes Fenton Foy, NP  LIVALO 4 MG TABS Take  1 tablet by mouth once daily 02/16/22  Yes Croitoru, Mihai, MD  metFORMIN (GLUCOPHAGE) 1000 MG tablet Take 1 tablet (1,000 mg total) by mouth 2 (two) times daily with a meal. 01/08/22  Yes Dorena Dew, FNP  metoprolol tartrate (  LOPRESSOR) 100 MG tablet Take 1 tablet (100 mg total) by mouth 2 (two) times daily. 12/10/21 12/05/22 Yes Hilty, Nadean Corwin, MD  Polyethyl Glycol-Propyl Glycol (SYSTANE) 0.4-0.3 % GEL ophthalmic gel Place 1 Application into both eyes daily as needed (For dry eyes).   Yes [provider]  sildenafil (VIAGRA) 100 MG tablet Take 1 tablet (100 mg total) by mouth daily as needed. 03/06/22  Yes Fenton Foy, NP  Testosterone (ANDROGEL PUMP) 20.25 MG/ACT (1.62%) GEL Place 1 Pump onto the skin daily. 04/08/22  Yes Fenton Foy, NP  cyanocobalamin 1000 MCG tablet Take 1,000 mcg by mouth daily.    [provider]  EPINEPHrine 0.3 mg/0.3 mL IJ SOAJ injection Inject 0.3 mg into the muscle as needed for anaphylaxis. 05/07/21   Clemon Chambers, MD  nitroGLYCERIN (NITROSTAT) 0.4 MG SL tablet Place 1 tablet (0.4 mg total) under the tongue every 5 (five) minutes x 3 doses as needed for chest pain. 07/18/21   Hilty, Nadean Corwin, MD    Patient Stressors: Financial difficulties   Substance abuse    Patient Strengths: Ability for insight  Average or above average intelligence  Capable of independent living  Communication skills  General fund of knowledge  Motivation for treatment/growth   Treatment Modalities: Medication Management, Group therapy, Case management,  1 to 1 session with clinician, Psychoeducation, Recreational therapy.   Physician Treatment Plan for Primary and Secondary Diagnosis:  Final diagnoses:  Uncomplicated alcohol dependence ([place])  Type 2 diabetes mellitus without complication, without long-term current use of insulin ([name])  Essential hypertension   Long Term Goal(s): Improvement in symptoms so as ready for discharge  Short Term Goals:  Patient will attend at least of 50% of the groups daily. Pt will complete the PHQ9 on admission, day 3 and discharge. Patient will take medications as prescribed daily.  Medication Management: Evaluate patient's response, side effects, and tolerance of medication regimen.  Therapeutic Interventions: 1 to 1 sessions, Unit Group sessions and Medication administration.  Evaluation of Outcomes: Progressing  LCSW Treatment Plan for Primary Diagnosis:  Final diagnoses:  Uncomplicated alcohol dependence ([place])  Type 2 diabetes mellitus without complication, without long-term current use of insulin ([place])  Essential hypertension    Long Term Goal(s): Safe transition to appropriate next level of care at discharge.  Short Term Goals: Facilitate acceptance of mental health diagnosis and concerns through verbal commitment to aftercare plan and appointments at discharge., Patient will identify one social support prior to discharge to aid in patient's recovery., Patient will attend AA/NA groups as scheduled., Identify minimum of 2 triggers associated with mental health/substance abuse issues with treatment team members., and Increase skills for wellness and recovery by attending 50% of scheduled groups.  Therapeutic Interventions: Assess for all discharge needs, 1 to 1 time with Education officer, museum, Explore available resources and support systems, Assess for adequacy in community support network, Educate family and significant other(s) on suicide prevention, Complete Psychosocial Assessment, Interpersonal group therapy.  Evaluation of Outcomes: Progressing   Progress in Treatment: Attending groups: Yes. Participating in groups: Yes. Taking medication as prescribed: Yes. Toleration medication: Yes. Family/Significant other contact made: No, will contact:  Patient did not provide permission for LCSW to contact family for collateral information.  Patient understands diagnosis: Yes. Discussing patient  identified problems/goals with staff: Yes. Medical problems stabilized or resolved: Yes. Denies suicidal/homicidal ideation: Yes. Issues/concerns per patient self-inventory: Yes. Other: substance use  New problem(s) identified: No, Describe:  none than reported on admission  New Short Term/Long Term Goal(s): Safe transition to appropriate next level of care at discharge, Engage patient in therapeutic group addressing interpersonal concerns. Engage patient in aftercare planning with referrals and resources, Increase ability to appropriately verbalize feelings, Facilitate acceptance of mental health diagnosis and concerns and Identify triggers associated with mental health/substance abuse issues.    Patient Goals:  Patient is seeking residential placement at this time for alcohol and substance use.   Discharge Plan or Barriers: LCSW to send out referrals for review. Will follow up once update has been received.   Reason for Continuation of Hospitalization: Other; describe current seeking residential placement for substance and alcohol use.   Estimated Length of Stay: 3-5 days  Last 3 [place] Suicide Severity Risk Score: Flowsheet Row ED from 05/16/2022 in [facility] ED from 01/20/2022 in [facility] ED from 10/10/2021 in [facility] [place] No Risk No Risk       Last PHQ 2/9 Scores:    05/16/2022    1:22 PM 05/16/2022   12:55 PM 05/16/2022    8:19 AM  Depression screen PHQ 2/9  Decreased Interest 2 0 2  Down, Depressed, Hopeless 2 0 0  PHQ - 2 Score 4 0 2  Altered sleeping 1  2  Tired, decreased energy 1  3  Change in appetite 1  3  Feeling bad or failure about yourself  2  0  Trouble concentrating 2  3  Moving slowly or fidgety/restless 0  0  Suicidal thoughts 0  0  PHQ-9 Score 11  13  Difficult doing work/chores Somewhat difficult  Somewhat difficult    Scribe for  Treatment Team: Gigi Gin 05/18/2022 4:10 PM

## 2022-05-18 NOTE — ED Notes (Signed)
Pt is sleeping. No distress noted. Will continue to monitor safety. 

## 2022-05-19 DIAGNOSIS — F1020 Alcohol dependence, uncomplicated: Secondary | ICD-10-CM | POA: Diagnosis not present

## 2022-05-19 DIAGNOSIS — I10 Essential (primary) hypertension: Secondary | ICD-10-CM | POA: Diagnosis not present

## 2022-05-19 DIAGNOSIS — Z9151 Personal history of suicidal behavior: Secondary | ICD-10-CM | POA: Diagnosis not present

## 2022-05-19 DIAGNOSIS — E119 Type 2 diabetes mellitus without complications: Secondary | ICD-10-CM | POA: Diagnosis not present

## 2022-05-19 MED ORDER — VITAMIN B-1 100 MG PO TABS: 100.0000 mg | ORAL_TABLET | Freq: Every day | ORAL | Status: DC

## 2022-05-19 NOTE — Discharge Planning (Signed)
LCSW met with patient on this morning to discuss plans for discharge and applications to be sent off. Patient informed LCSW that he did not find interest in the facility list provided, and would prefer to discharge home with resumption of IOP with [facility] the [place]. Patient reports he was going to the facility twice a week and has found this beneficial to his recovery. Patient reports being resourceful stating he will follow up with [facility] once he is discharged, as they have walk-in hours. Patient denied a need for any other resources at this time stating he would like to continue with his outpatient plan. LCSW provided brief supportive counseling to the patient and the patient was receptive to the feedback provided. No other needs to report. Patient aware that LCSW will provide update to MD regarding plan.   Lucius Conn, LCSW Clinical Social Worker [facility] Ph: [phone]

## 2022-05-19 NOTE — ED Notes (Signed)
Pt sleeping@this time. Breathing even and unlabored. Will continue to monitor for safety 

## 2022-05-19 NOTE — Discharge Planning (Signed)
LCSW received update from MD that patient will be discharged tomorrow evening after his last dose of medication. Per MD, patient is aware and no other concerns were reported. Follow up contact information provided in AVS and AA meeting information. LCSW will continue to follow and provide support to patient while on the unit.   Lucius Conn, LCSW Clinical Social Worker [facility] Ph: [phone]

## 2022-05-19 NOTE — Discharge Instructions (Addendum)
[facility] [place], [place], [address] [phone] phone   12 STEP PROGRAMS:  Alcoholics Anonymous of [facility] ReportZoo.com.cy  Narcotics Anonymous of [place] GreenScrapbooking.dk  Al-Anon of [place], [place] www.[place]alanon.org/find-meetings.html  Nar-Anon https://nar-anon.org/find-a-meetin  Dear [name],  Most effective treatment for your mental health disease involves BOTH a psychiatrist AND a therapist Psychiatrist to manage medications Therapist to help identify personal goals, barriers from those goals, and plan to achieve those goals by understanding emotions Please make regular appointments with an outpatient psychiatrist and other doctors once you leave the hospital (if any, otherwise, please see below for resources to make an appointment).  For therapy outside the hospital, please ask for these specific types of therapy: CBT ________________________________________________________  SAFETY CRISIS  Dial 988 for [place]    Text [phone] for Crisis Text Line:     [facility]:  [address] FIRST FLOOR.  [place], [place] [address].  [phone]  Mobile Crisis Response Teams Listed by counties in vicinity of [place]. [phone] [place] [phone] [place] [phone] [facility] [phone] [place] [phone] [place]. [phone] [place].  [place] [phone] ________________________________________________________  To see which  pharmacy near you is the CHEAPEST for certain medications, please use GoodRx. It is free website and has a free phone app.    Also consider looking at [facility] $4.00 or [facility]'s $7.00 prescription list. Both are free to view if googled "[facility] $4 prescription" and "public's $7 prescription". These are set prices, no insurance required. [facility] low cost medications: $4-$15 for 30days prescriptions or $10-$38 for 90days prescriptions  ________________________________________________________  Difficulties with sleep?   Can also use this free app for insomnia called CBT-I. Let your doctors and therapists know so they can help with extra tips and tricks or for guidance and accountability. NO ADDS on the app.     ________________________________________________________  Non-Emergent / Urgent  [facility] [address], [place], [place] [address] [phone] OUTPATIENT Walk-in information: Please note, all walk-ins are first come & first serve, with limited number of availability.  Please note that to be eligible for services you must bring: ID or a piece of mail with your name [facility] address  Therapist for therapy:  Monday & Wednesdays: Please ARRIVE at 7:15 AM for registration Will START at 8:00 AM Every 1st & 2nd Friday of the month: Please ARRIVE at 10:15 AM for registration Will START at 1 PM - 5 PM  Psychiatrist for medication management: Monday - Friday:  Please ARRIVE at 7:15 AM for registration Will START at 8:00 AM  Regretfully, due to limited availability, please be aware that you may not been seen on the same day as walk-in. Please consider making an appoint or try again. Thank you for your patience and understanding.

## 2022-05-19 NOTE — ED Provider Notes (Signed)
Behavioral Health Progress Note  Date and Time: 05/19/2022 12:40 PM Name: [name] MRN:  [id number]  Subjective:  [name] is a 61 year old male patient with a past psychiatric history significant for alcohol dependence, MDD, GAD, and past suicide attempts who presented to the [facility] behavioral health urgent care voluntary seeking alcohol detox. Last drink 11/18  Patient denied withdrawal symptoms including shakes/tremors. Denied EtOH cravings. Stated that he would rather go home before the holidays and participate in S-IOP, which he did well with in the past. Continued to feel confident and motivated in sobriety.   Discussed that he can be dc'd after the 2nd to last dose of ativan 11/22 PM. Patient had no other questions or concerns. Denied somatic sxs per below. Amenable to plan per below.   Today he denied SI/HI/AVH.  He contracted to safety.   Sleep:Good Appetite: Good  Diagnosis:  Final diagnoses:  Uncomplicated alcohol dependence ([place])  Type 2 diabetes mellitus without complication, without long-term current use of insulin ([name])  Essential hypertension    Total Time spent with patient: 20 minutes  Past Psychiatric History: See H&P Past Medical History:  Past Medical History:  Diagnosis Date   Alcohol dependence ([place])    Allergies    Arthritis    Chest pain    Chronic lower back pain    Chronic pain of right wrist    Coronary artery disease    a. Multiple prior caths/PCI. Cath 2013 with possible spasm of RCA, 70% ISR of mid LCx with subsequent DES to mLCx and prox LCX. b. H/o microvascular angina. c. Recurrent angina 08/2014 - s/p PTCA/DES to prox Cx, PTCA/CBA to OM1.  c. LHC 06/10/15 with patent stents and some ISR in LCX and OM-1 that was not flow limiting --> Rx    Dyslipidemia    a. Intolerant to many statins except tolerating Livalo.   GERD (gastroesophageal reflux disease)    H/O cardiac catheterization 10/25/2018   Heart attack [facility])     Hypertension    Myocardial infarction [facility]) ~ 2010   S/P angioplasty with stent, DES, to proximal and mid LCX 12/15/11 12/15/2011   S/P foot surgery, right 04/2021   Shoulder pain    Stroke ([place])    pt. reports had a stroke around time of MI 2010   Type II diabetes mellitus ([place])    Unstable angina [facility])     Past Surgical History:  Procedure Laterality Date   CARDIAC CATHETERIZATION  06/15/2002   LAD with prox 40% stenosis, norma L main, Cfx with 25% lesion, RCA with long mid 25% stenosis (Dr. Vita Barley)   CARDIAC CATHETERIZATION  04/01/2010   normal L main, LAD wit mild stenosis, L Cfx with 70% in-stent restenosis, RCA with 70% in-stent restenosis, LVEF >60% (Dr. K. Mali Hilty) - cutting ballon arthrectomy to RCA & Cfx (Dr. Rockne Menghini)   CARDIAC CATHETERIZATION  08/25/2010   preserved global LV contractility; multivessel CAD, diffuse 90-95% in-stent restenosis in prox placed Cfx stent - cutting balloon arthrectomy in Cfx with multiple dilatations 90-95% to 0% stenosis (Dr. Corky Downs)   CARDIAC CATHETERIZATION  01/26/2011   PCI & stenting of aggresive in-stent restenosis within previously stented AV groove Cfx with 3.0x49m Taxus DES (previous stents were Promus) (Dr. JAdora Fridge   CARDIAC CATHETERIZATION  05/11/2011   preserved LV function, 40% mid LAD stenosis, 30-40% narrowing proximal to stented semgnet of prox Cfx, patent mid RCA stent with smooth 20% narrowing in distal RCA (  Dr. Corky Downs)   CARDIAC CATHETERIZATION  12/15/2011   PCI & stenting of proximal & mid Cfx with DES - 3.0x64m in proximal, 3.0x151min mid (Dr. J.Adora Fridge  [place] 06/10/2015   Procedure: Left Heart Cath and Coronary Angiography;  Surgeon: DaJolaine ArtistMD;  [facility];  Service: Cardiovascular;  Laterality: N/A;   CARDIAC CATHETERIZATION  0485/27/7824 cardiometabolic testing  07/31/33/3614 good exercise effort, peak VO2 79% predicted with normal VO2 HR curves (mild  deconditioning)   COLONOSCOPY  12/2012   diminutive hyperplastic sigmoid poyp so repeat routine 2024   CORONARY BALLOON ANGIOPLASTY N/A 10/25/2018   Procedure: CORONARY BALLOON ANGIOPLASTY;  Surgeon: VaJettie BoozeMD;  [facility];  Service: Cardiovascular;  Laterality: N/A;   CORONARY BALLOON ANGIOPLASTY N/A 09/29/2019   Procedure: CORONARY BALLOON ANGIOPLASTY;  Surgeon: EnNelva BushMD;  [facility];  Service: Cardiovascular;  Laterality: N/A;   EXCISIONAL HEMORRHOIDECTOMY  1984   INTRAVASCULAR ULTRASOUND/IVUS N/A 09/29/2019   Procedure: Intravascular Ultrasound/IVUS;  Surgeon: EnNelva BushMD;  Location: [facility];  Service: Cardiovascular;  Laterality: N/A;   LEFT HEART CATH AND CORONARY ANGIOGRAPHY N/A 10/25/2018   Procedure: LEFT HEART CATH AND CORONARY ANGIOGRAPHY;  Surgeon: VaJettie BoozeMD;  [facility];  Service: Cardiovascular;  Laterality: N/A;   LEFT HEART CATH AND CORONARY ANGIOGRAPHY N/A 09/29/2019   Procedure: LEFT HEART CATH AND CORONARY ANGIOGRAPHY;  Surgeon: EnNelva BushMD;  Location: [facility];  Service: Cardiovascular;  Laterality: N/A;   LEFT HEART CATHETERIZATION WITH CORONARY ANGIOGRAM N/A 05/11/2011   Procedure: LEFT HEART CATHETERIZATION WITH CORONARY ANGIOGRAM;  Surgeon: ThTroy SineMD;  [facility];  Service: Cardiovascular;  Laterality: N/A;  Possible percutaneous coronary intervention, possible IVUS   LEFT HEART CATHETERIZATION WITH CORONARY ANGIOGRAM N/A 12/15/2011   Procedure: LEFT HEART CATHETERIZATION WITH CORONARY ANGIOGRAM;  Surgeon: JoLorretta HarpMD;  [facility];  Service: Cardiovascular;  Laterality: N/A;   LEFT HEART CATHETERIZATION WITH CORONARY ANGIOGRAM N/A 09/05/2014   Procedure: LEFT HEART CATHETERIZATION WITH CORONARY ANGIOGRAM;  Surgeon: ChBurnell BlanksMD;  [facility];  Service: Cardiovascular;  Laterality: N/A;    LIPOMA EXCISION     back of the head   NM MYOCAR PERF WALL MOTION  02/2012   lexiscan myoview; mild perfusion defect in mid inferolateral & basal inferolateral region (infarct/scar); EF 52%, abnormal but ow risk scan   PERCUTANEOUS CORONARY STENT INTERVENTION (PCI-S)  09/05/2014   Procedure: PERCUTANEOUS CORONARY STENT INTERVENTION (PCI-S);  Surgeon: ChBurnell BlanksMD;  [facility];  Service: Cardiovascular;;   TONSILLECTOMY     Family History:  Family History  Problem Relation Age of Onset   Leukemia Mother    Prostate cancer Father    Cancer Brother    Coronary artery disease Paternal Grandmother    Cancer Paternal Grandfather    Migraines Neg Hx    Headache Neg Hx    Family Psychiatric  History: See H&P Social History:  Social History   Substance and Sexual Activity  Alcohol Use Yes   Comment: 2 bottles of wine on 12/08/2019 (one occurrence)     Social History   Substance and Sexual Activity  Drug Use Not Currently   Types: Cocaine   Comment: last use several months ago    Social History   Socioeconomic History  Marital status: Widowed    Spouse name: Not on file   Number of children: 2   Years of education: GED   Highest education level: Not on file  Occupational History   Not on file  Tobacco Use   Smoking status: Former    Packs/day: 1.00    Years: 10.00    Total pack years: 10.00    Types: Cigarettes    Quit date: 10/07/2018    Years since quitting: 3.6   Smokeless tobacco: Never  Vaping Use   Vaping Use: Never used  Substance and Sexual Activity   Alcohol use: Yes    Comment: 2 bottles of wine on 12/08/2019 (one occurrence)   Drug use: Not Currently    Types: Cocaine    Comment: last use several months ago   Sexual activity: Not Currently  Other Topics Concern   Not on file  Social History Narrative   ** Merged History Encounter **    Pt states he only smoke when he drinks   Social Determinants of Health   Financial  Resource Strain: Low Risk  (12/11/2021)   Overall Financial Resource Strain (CARDIA)    Difficulty of Paying Living Expenses: Not very hard  Food Insecurity: Not on file  Transportation Needs: No Transportation Needs (12/11/2021)   PRAPARE - Hydrologist (Medical): No    Lack of Transportation (Non-Medical): No  Physical Activity: Not on file  Stress: Not on file  Social Connections: Not on file   SDOH:  [place]: Low Risk  (12/11/2021)  Transportation Needs: No Transportation Needs (12/11/2021)  Alcohol Screen: Medium Risk (08/27/2020)  Depression (PHQ2-9): High Risk (05/16/2022)  Financial Resource Strain: Low Risk  (12/11/2021)  Tobacco Use: Medium Risk (05/18/2022)   Additional Social History:    Pain Medications: See MAR Prescriptions: See MAR Over the Counter: See MAR History of alcohol / drug use?: Yes Longest period of sobriety (when/how long): 4 years and 1 month Negative Consequences of Use: Financial, Personal relationships Withdrawal Symptoms: None Name of Substance 1: ETOH 1 - Age of First Use: 23 1 - Amount (size/oz): 1 pint of gin, with 16 oz beer daily 1 - Frequency: daily 1 - Duration: relapsed 4 months ago 1 - Last Use / Amount: this morning/ 1 pint of gin 1 - Method of Aquiring: NA 1- Route of Use: drinks                  Current Medications:  Current Facility-Administered Medications  Medication Dose Route Frequency Provider Last Rate Last Admin   acetaminophen (TYLENOL) tablet 650 mg  650 mg Oral Q6H PRN White, Patrice L, NP   650 mg at 05/17/22 1048   alum & mag hydroxide-simeth (MAALOX/MYLANTA) 200-200-20 MG/5ML suspension 30 mL  30 mL Oral Q4H PRN White, Patrice L, NP       amLODipine (NORVASC) tablet 10 mg  10 mg Oral Daily White, Patrice L, NP   10 mg at 05/19/22 0801   aspirin EC tablet 81 mg  81 mg Oral Daily White, Patrice L, NP   81 mg at 05/19/22 1014   clopidogrel (PLAVIX) tablet 75 mg  75 mg  Oral Daily White, Patrice L, NP   75 mg at 05/19/22 1014   ezetimibe (ZETIA) tablet 10 mg  10 mg Oral Daily Damita Dunnings B, MD   10 mg at 05/19/22 1014   hydrOXYzine (ATARAX) tablet 25 mg  25 mg Oral  TID PRN White, Patrice L, NP       loperamide (IMODIUM) capsule 2-4 mg  2-4 mg Oral PRN White, Patrice L, NP       LORazepam (ATIVAN) tablet 1 mg  1 mg Oral Q6H PRN White, Patrice L, NP       [START ON 05/20/2022] LORazepam (ATIVAN) tablet 1 mg  1 mg Oral Daily White, Patrice L, NP       magnesium hydroxide (MILK OF MAGNESIA) suspension 30 mL  30 mL Oral Daily PRN White, Patrice L, NP       metFORMIN (GLUCOPHAGE) tablet 1,000 mg  1,000 mg Oral BID WC White, Patrice L, NP   1,000 mg at 05/19/22 0801   metoprolol tartrate (LOPRESSOR) tablet 100 mg  100 mg Oral BID White, Patrice L, NP   100 mg at 05/19/22 1014   multivitamin with minerals tablet 1 tablet  1 tablet Oral Daily White, Patrice L, NP   1 tablet at 05/19/22 1014   nitroGLYCERIN (NITROSTAT) SL tablet 0.4 mg  0.4 mg Sublingual Q5 Min x 3 PRN White, Patrice L, NP       ondansetron (ZOFRAN-ODT) disintegrating tablet 4 mg  4 mg Oral Q6H PRN White, Patrice L, NP       polyethylene glycol (MIRALAX / GLYCOLAX) packet 17 g  17 g Oral Daily Merrily Brittle, DO   17 g at 05/19/22 1015   thiamine (VITAMIN B1) tablet 100 mg  100 mg Oral Daily White, Patrice L, NP   100 mg at 05/19/22 1014   Current Outpatient Medications  Medication Sig Dispense Refill   Accu-Chek Softclix Lancets lancets SMARTSIG:1 Topical 4 Times Daily     amLODipine (NORVASC) 10 MG tablet Take 1 tablet by mouth once daily 90 tablet 3   aspirin 81 MG EC tablet Take 1 tablet (81 mg total) by mouth daily. (May buy from over the counter): Swallow whole for heart health (Patient taking differently: Take 81 mg by mouth at bedtime. (May buy from over the counter): Swallow whole for heart health) 30 tablet 0   blood glucose meter kit and supplies KIT Dispense based on patient and insurance  preference. Use up to four times daily as directed. (FOR ICD-9 250.00, 250.01). 1 each 0   Blood Pressure Monitor KIT 1 kit by Does not apply route daily. 1 kit 0   cholecalciferol (VITAMIN D3) 25 MCG (1000 UNIT) tablet Take 5,000 Units by mouth every other day.     clopidogrel (PLAVIX) 75 MG tablet Take 1 tablet by mouth once daily 90 tablet 3   ezetimibe (ZETIA) 10 MG tablet Take 1 tablet (10 mg total) by mouth daily. 30 tablet 2   glucose blood (ACCU-CHEK GUIDE) test strip USE AS DIRECTED to check blood sugars three times a day. 300 each 0   LIVALO 4 MG TABS Take 1 tablet by mouth once daily 90 tablet 3   metFORMIN (GLUCOPHAGE) 1000 MG tablet Take 1 tablet (1,000 mg total) by mouth 2 (two) times daily with a meal. 180 tablet 3   metoprolol tartrate (LOPRESSOR) 100 MG tablet Take 1 tablet (100 mg total) by mouth 2 (two) times daily. 180 tablet 3   Polyethyl Glycol-Propyl Glycol (SYSTANE) 0.4-0.3 % GEL ophthalmic gel Place 1 Application into both eyes daily as needed (For dry eyes).     sildenafil (VIAGRA) 100 MG tablet Take 1 tablet (100 mg total) by mouth daily as needed. 10 tablet 5   Testosterone (ANDROGEL PUMP) 20.25  MG/ACT (1.62%) GEL Place 1 Pump onto the skin daily. 75 g 2   cyanocobalamin 1000 MCG tablet Take 1,000 mcg by mouth daily.     EPINEPHrine 0.3 mg/0.3 mL IJ SOAJ injection Inject 0.3 mg into the muscle as needed for anaphylaxis. 1 each 2   nitroGLYCERIN (NITROSTAT) 0.4 MG SL tablet Place 1 tablet (0.4 mg total) under the tongue every 5 (five) minutes x 3 doses as needed for chest pain. 25 tablet 3    Labs  Lab Results:  Admission on 05/16/2022  Component Date Value Ref Range Status   SARS Coronavirus 2 by RT PCR 05/16/2022 NEGATIVE  NEGATIVE Final   Comment: (NOTE) SARS-CoV-2 target nucleic acids are NOT DETECTED.  The SARS-CoV-2 RNA is generally detectable in upper respiratory specimens during the acute phase of infection. The lowest concentration of SARS-CoV-2 viral  copies this assay can detect is 138 copies/mL. A negative result does not preclude SARS-Cov-2 infection and should not be used as the sole basis for treatment or other patient management decisions. A negative result may occur with  improper specimen collection/handling, submission of specimen other than nasopharyngeal swab, presence of viral mutation(s) within the areas targeted by this assay, and inadequate number of viral copies(<138 copies/mL). A negative result must be combined with clinical observations, patient history, and epidemiological information. The expected result is Negative.  Fact Sheet for Patients:  EntrepreneurPulse.com.au  Fact Sheet for Healthcare Providers:  IncredibleEmployment.be  This test is no                          t yet approved or cleared by the [place] FDA and  has been authorized for detection and/or diagnosis of SARS-CoV-2 by FDA under an Emergency Use Authorization (EUA). This EUA will remain  in effect (meaning this test can be used) for the duration of the COVID-19 declaration under Section 564(b)(1) of the Act, 21 U.S.C.section 360bbb-3(b)(1), unless the authorization is terminated  or revoked sooner.       Influenza A by PCR 05/16/2022 NEGATIVE  NEGATIVE Final   Influenza B by PCR 05/16/2022 NEGATIVE  NEGATIVE Final   Comment: (NOTE) The Xpert Xpress SARS-CoV-2/FLU/RSV plus assay is intended as an aid in the diagnosis of influenza from Nasopharyngeal swab specimens and should not be used as a sole basis for treatment. Nasal washings and aspirates are unacceptable for Xpert Xpress SARS-CoV-2/FLU/RSV testing.  Fact Sheet for Patients: EntrepreneurPulse.com.au  Fact Sheet for Healthcare Providers: IncredibleEmployment.be  This test is not yet approved or cleared by the [place] FDA and has been authorized for detection and/or diagnosis of SARS-CoV-2 by FDA  under an Emergency Use Authorization (EUA). This EUA will remain in effect (meaning this test can be used) for the duration of the COVID-19 declaration under Section 564(b)(1) of the Act, 21 U.S.C. section 360bbb-3(b)(1), unless the authorization is terminated or revoked.  Performed at [facility], [place] [address], [place], [place] [address]    WBC 05/16/2022 7.1  4.0 - 10.5 K/uL Final   RBC 05/16/2022 5.36  4.22 - 5.81 MIL/uL Final   Hemoglobin 05/16/2022 15.4  13.0 - 17.0 g/dL Final   HCT 05/16/2022 45.3  39.0 - 52.0 % Final   MCV 05/16/2022 84.5  80.0 - 100.0 fL Final   MCH 05/16/2022 28.7  26.0 - 34.0 pg Final   MCHC 05/16/2022 34.0  30.0 - 36.0 g/dL Final   RDW 05/16/2022 13.0  11.5 - 15.5 %  Final   Platelets 05/16/2022 262  150 - 400 K/uL Final   nRBC 05/16/2022 0.0  0.0 - 0.2 % Final   Neutrophils Relative % 05/16/2022 51  % Final   Neutro Abs 05/16/2022 3.6  1.7 - 7.7 K/uL Final   Lymphocytes Relative 05/16/2022 33  % Final   Lymphs Abs 05/16/2022 2.3  0.7 - 4.0 K/uL Final   Monocytes Relative 05/16/2022 7  % Final   Monocytes Absolute 05/16/2022 0.5  0.1 - 1.0 K/uL Final   Eosinophils Relative 05/16/2022 8  % Final   Eosinophils Absolute 05/16/2022 0.6 (H)  0.0 - 0.5 K/uL Final   Basophils Relative 05/16/2022 1  % Final   Basophils Absolute 05/16/2022 0.1  0.0 - 0.1 K/uL Final   Immature Granulocytes 05/16/2022 0  % Final   Abs Immature Granulocytes 05/16/2022 0.02  0.00 - 0.07 K/uL Final   Performed at [facility], [place] [address], [place], [place] [address]   Sodium 05/16/2022 140  135 - 145 mmol/L Final   Potassium 05/16/2022 4.8  3.5 - 5.1 mmol/L Final   Chloride 05/16/2022 100  98 - 111 mmol/L Final   CO2 05/16/2022 20 (L)  22 - 32 mmol/L Final   Glucose, Bld 05/16/2022 144 (H)  70 - 99 mg/dL Final   Glucose reference range applies only to samples taken after fasting for at least 8 hours.   BUN 05/16/2022 16  6 - 20 mg/dL Final   Creatinine, Ser  05/16/2022 0.92  0.61 - 1.24 mg/dL Final   Calcium 05/16/2022 10.3  8.9 - 10.3 mg/dL Final   Total Protein 05/16/2022 6.8  6.5 - 8.1 g/dL Final   Albumin 05/16/2022 4.5  3.5 - 5.0 g/dL Final   AST 05/16/2022 30  15 - 41 U/L Final   ALT 05/16/2022 42  0 - 44 U/L Final   Alkaline Phosphatase 05/16/2022 61  38 - 126 U/L Final   Total Bilirubin 05/16/2022 0.5  0.3 - 1.2 mg/dL Final   GFR, Estimated 05/16/2022 >60  >60 mL/min Final   Comment: (NOTE) Calculated using the CKD-EPI Creatinine Equation (2021)    Anion gap 05/16/2022 20 (H)  5 - 15 Final   Performed at [place] [address] [place], [place] [address]   Hgb A1c MFr Bld 05/16/2022 6.6 (H)  4.8 - 5.6 % Final   Comment: (NOTE)         Prediabetes: 5.7 - 6.4         Diabetes: >6.4         Glycemic control for adults with diabetes: <7.0    Mean Plasma Glucose 05/16/2022 143  mg/dL Final   Comment: (NOTE) Performed At: [facility] [place], [place] [id number] Rush Farmer MD AT:[id number]    Alcohol, Ethyl (B) 05/16/2022 36 (H)  <10 mg/dL Final   Comment: (NOTE) Lowest detectable limit for serum alcohol is 10 mg/dL.  For medical purposes only. Performed at [facility], [place] [address], [place], [place] [address]    Cholesterol 05/16/2022 126  0 - 200 mg/dL Final   Triglycerides 05/16/2022 219 (H)  <150 mg/dL Final   HDL 05/16/2022 45  >40 mg/dL Final   Total CHOL/HDL Ratio 05/16/2022 2.8  RATIO Final   VLDL 05/16/2022 44 (H)  0 - 40 mg/dL Final   LDL Cholesterol 05/16/2022 37  0 - 99 mg/dL Final   Comment:  Total Cholesterol/HDL:CHD Risk Coronary Heart Disease Risk Table                     Men   Women  1/2 Average Risk   3.4   3.3  Average Risk       5.0   4.4  2 X Average Risk   9.6   7.1  3 X Average Risk  23.4   11.0        Use the calculated Patient Ratio above and the CHD Risk Table to determine the patient's CHD Risk.        ATP III CLASSIFICATION (LDL):   <100     mg/dL   Optimal  100-129  mg/dL   Near or Above                    Optimal  130-159  mg/dL   Borderline  160-189  mg/dL   High  >190     mg/dL   Very High Performed at [place] [address], [place],  [address]    TSH 05/16/2022 1.742  0.350 - 4.500 uIU/mL Final   Comment: Performed by a 3rd Generation assay with a functional sensitivity of <=0.01 uIU/mL. Performed at [facility], [place] [address] [place], [place] [address]    POC Amphetamine UR 05/16/2022 None Detected  NONE DETECTED (Cut Off Level 1000 ng/mL) Final   POC Secobarbital (BAR) 05/16/2022 None Detected  NONE DETECTED (Cut Off Level 300 ng/mL) Final   POC Buprenorphine (BUP) 05/16/2022 None Detected  NONE DETECTED (Cut Off Level 10 ng/mL) Final   POC Oxazepam (BZO) 05/16/2022 None Detected  NONE DETECTED (Cut Off Level 300 ng/mL) Final   POC Cocaine UR 05/16/2022 None Detected  NONE DETECTED (Cut Off Level 300 ng/mL) Final   POC Methamphetamine UR 05/16/2022 None Detected  NONE DETECTED (Cut Off Level 1000 ng/mL) Final   POC Morphine 05/16/2022 None Detected  NONE DETECTED (Cut Off Level 300 ng/mL) Final   POC Methadone UR 05/16/2022 None Detected  NONE DETECTED (Cut Off Level 300 ng/mL) Final   POC Oxycodone UR 05/16/2022 None Detected  NONE DETECTED (Cut Off Level 100 ng/mL) Final   POC Marijuana UR 05/16/2022 None Detected  NONE DETECTED (Cut Off Level 50 ng/mL) Final   SARSCOV2ONAVIRUS 2 AG 05/16/2022 NEGATIVE  NEGATIVE Final   Comment: (NOTE) SARS-CoV-2 antigen NOT DETECTED.   Negative results are presumptive.  Negative results do not preclude SARS-CoV-2 infection and should not be used as the sole basis for treatment or other patient management decisions, including infection  control decisions, particularly in the presence of clinical signs and  symptoms consistent with COVID-19, or in those who have been in contact with the virus.  Negative results must be combined with clinical  observations, patient history, and epidemiological information. The expected result is Negative.  Fact Sheet for Patients: HandmadeRecipes.com.cy  Fact Sheet for Healthcare Providers: FuneralLife.at  This test is not yet approved or cleared by the [place] FDA and  has been authorized for detection and/or diagnosis of SARS-CoV-2 by FDA under an Emergency Use Authorization (EUA).  This EUA will remain in effect (meaning this test can be used) for the duration of  the COV                          ID-19 declaration under Section 564(b)(1) of the Act, 21 U.S.C. section 360bbb-3(b)(1),  unless the authorization is terminated or revoked sooner.     Glucose-Capillary 05/18/2022 134 (H)  70 - 99 mg/dL Final   Glucose reference range applies only to samples taken after fasting for at least 8 hours.  Office Visit on 03/06/2022  Component Date Value Ref Range Status   Testosterone 03/06/2022 261 (L)  264 - 916 ng/dL Final   Comment: Adult male reference interval is based on a population of healthy nonobese males (BMI <30) between 31 and 5 years old. [place], [place] [id number]. PMID: [id number].    Vit D, 25-Hydroxy 03/06/2022 40.1  30.0 - 100.0 ng/mL Final   Comment: Vitamin D deficiency has been defined by the [place] practice guideline as a level of serum 25-OH vitamin D less than 20 ng/mL (1,2). The Endocrine Society went on to further define vitamin D insufficiency as a level between 21 and 29 ng/mL (2). 1. IOM (Institute of Medicine). 2010. Dietary reference    intakes for calcium and D. [place]: The    [employer]. 2. Holick MF, Binkley Spencer, Bischoff-Ferrari HA, et al.    Evaluation, treatment, and prevention of vitamin D    deficiency: an Endocrine Society clinical practice    guideline. JCEM. 2011 Jul; 96(7):1911-30.    Vitamin B-12 03/06/2022 543  232 - 1,245  pg/mL Final  Admission on 01/20/2022, Discharged on 01/21/2022  Component Date Value Ref Range Status   SARS Coronavirus 2 by RT PCR 01/20/2022 NEGATIVE  NEGATIVE Final   Comment: (NOTE) SARS-CoV-2 target nucleic acids are NOT DETECTED.  The SARS-CoV-2 RNA is generally detectable in upper respiratory specimens during the acute phase of infection. The lowest concentration of SARS-CoV-2 viral copies this assay can detect is 138 copies/mL. A negative result does not preclude SARS-Cov-2 infection and should not be used as the sole basis for treatment or other patient management decisions. A negative result may occur with  improper specimen collection/handling, submission of specimen other than nasopharyngeal swab, presence of viral mutation(s) within the areas targeted by this assay, and inadequate number of viral copies(<138 copies/mL). A negative result must be combined with clinical observations, patient history, and epidemiological information. The expected result is Negative.  Fact Sheet for Patients:  EntrepreneurPulse.com.au  Fact Sheet for Healthcare Providers:  IncredibleEmployment.be  This test is no                          t yet approved or cleared by the [place] FDA and  has been authorized for detection and/or diagnosis of SARS-CoV-2 by FDA under an Emergency Use Authorization (EUA). This EUA will remain  in effect (meaning this test can be used) for the duration of the COVID-19 declaration under Section 564(b)(1) of the Act, 21 U.S.C.section 360bbb-3(b)(1), unless the authorization is terminated  or revoked sooner.       Influenza A by PCR 01/20/2022 NEGATIVE  NEGATIVE Final   Influenza B by PCR 01/20/2022 NEGATIVE  NEGATIVE Final   Comment: (NOTE) The Xpert Xpress SARS-CoV-2/FLU/RSV plus assay is intended as an aid in the diagnosis of influenza from Nasopharyngeal swab specimens and should not be used as a sole basis for  treatment. Nasal washings and aspirates are unacceptable for Xpert Xpress SARS-CoV-2/FLU/RSV testing.  Fact Sheet for Patients: EntrepreneurPulse.com.au  Fact Sheet for Healthcare Providers: IncredibleEmployment.be  This test is not yet approved or cleared by the [place] FDA and has been authorized for detection  and/or diagnosis of SARS-CoV-2 by FDA under an Emergency Use Authorization (EUA). This EUA will remain in effect (meaning this test can be used) for the duration of the COVID-19 declaration under Section 564(b)(1) of the Act, 21 U.S.C. section 360bbb-3(b)(1), unless the authorization is terminated or revoked.  Performed at [facility], [place] [address], [place], [place] [address]    Sodium 01/20/2022 139  135 - 145 mmol/L Final   Potassium 01/20/2022 4.2  3.5 - 5.1 mmol/L Final   Chloride 01/20/2022 107  98 - 111 mmol/L Final   CO2 01/20/2022 23  22 - 32 mmol/L Final   Glucose, Bld 01/20/2022 102 (H)  70 - 99 mg/dL Final   Glucose reference range applies only to samples taken after fasting for at least 8 hours.   BUN 01/20/2022 15  6 - 20 mg/dL Final   Creatinine, Ser 01/20/2022 0.93  0.61 - 1.24 mg/dL Final   Calcium 01/20/2022 9.3  8.9 - 10.3 mg/dL Final   Total Protein 01/20/2022 6.2 (L)  6.5 - 8.1 g/dL Final   Albumin 01/20/2022 4.0  3.5 - 5.0 g/dL Final   AST 01/20/2022 22  15 - 41 U/L Final   ALT 01/20/2022 35  0 - 44 U/L Final   Alkaline Phosphatase 01/20/2022 69  38 - 126 U/L Final   Total Bilirubin 01/20/2022 0.2 (L)  0.3 - 1.2 mg/dL Final   GFR, Estimated 01/20/2022 >60  >60 mL/min Final   Comment: (NOTE) Calculated using the CKD-EPI Creatinine Equation (2021)    Anion gap 01/20/2022 9  5 - 15 Final   Performed at [facility] [address], [place], [place] [address]   Hgb A1c MFr Bld 01/20/2022 7.0 (H)  4.8 - 5.6 % Final   Comment: (NOTE) Pre diabetes:          5.7%-6.4%  Diabetes:               >6.4%  Glycemic control for   <7.0% adults with diabetes    Mean Plasma Glucose 01/20/2022 154.2  mg/dL Final   Performed at [facility], [place] [address], [place], [place] [address]   Alcohol, Ethyl (B) 01/20/2022 <10  <10 mg/dL Final   Comment: (NOTE) Lowest detectable limit for serum alcohol is 10 mg/dL.  For medical purposes only. Performed at [facility], [place] [address], [place], [place] [address]    Cholesterol 01/20/2022 122  0 - 200 mg/dL Final   Triglycerides 01/20/2022 150 (H)  <150 mg/dL Final   HDL 01/20/2022 36 (L)  >40 mg/dL Final   Total CHOL/HDL Ratio 01/20/2022 3.4  RATIO Final   VLDL 01/20/2022 30  0 - 40 mg/dL Final   LDL Cholesterol 01/20/2022 56  0 - 99 mg/dL Final   Comment:        Total Cholesterol/HDL:CHD Risk Coronary Heart Disease Risk Table                     Men   Women  1/2 Average Risk   3.4   3.3  Average Risk       5.0   4.4  2 X Average Risk   9.6   7.1  3 X Average Risk  23.4   11.0        Use the calculated Patient Ratio above and the CHD Risk Table to determine the patient's CHD Risk.        ATP III  CLASSIFICATION (LDL):  <100     mg/dL   Optimal  100-129  mg/dL   Near or Above                    Optimal  130-159  mg/dL   Borderline  160-189  mg/dL   High  >190     mg/dL   Very High Performed at [facility] [address] [place], [place] [address]    TSH 01/20/2022 1.184  0.350 - 4.500 uIU/mL Final   Comment: Performed by a 3rd Generation assay with a functional sensitivity of <=0.01 uIU/mL. Performed at [facility], [place] [address] [place], [place] [address]    POC Amphetamine UR 01/20/2022 None Detected  NONE DETECTED (Cut Off Level 1000 ng/mL) Final   POC Secobarbital (BAR) 01/20/2022 None Detected  NONE DETECTED (Cut Off Level 300 ng/mL) Final   POC Buprenorphine (BUP) 01/20/2022 None Detected  NONE DETECTED (Cut Off Level 10 ng/mL) Final   POC Oxazepam (BZO) 01/20/2022 Positive (A)  NONE DETECTED  (Cut Off Level 300 ng/mL) Final   POC Cocaine UR 01/20/2022 None Detected  NONE DETECTED (Cut Off Level 300 ng/mL) Final   POC Methamphetamine UR 01/20/2022 None Detected  NONE DETECTED (Cut Off Level 1000 ng/mL) Final   POC Morphine 01/20/2022 None Detected  NONE DETECTED (Cut Off Level 300 ng/mL) Final   POC Methadone UR 01/20/2022 None Detected  NONE DETECTED (Cut Off Level 300 ng/mL) Final   POC Oxycodone UR 01/20/2022 None Detected  NONE DETECTED (Cut Off Level 100 ng/mL) Final   POC Marijuana UR 01/20/2022 None Detected  NONE DETECTED (Cut Off Level 50 ng/mL) Final   SARSCOV2ONAVIRUS 2 AG 01/20/2022 NEGATIVE  NEGATIVE Final   Comment: (NOTE) SARS-CoV-2 antigen NOT DETECTED.   Negative results are presumptive.  Negative results do not preclude SARS-CoV-2 infection and should not be used as the sole basis for treatment or other patient management decisions, including infection  control decisions, particularly in the presence of clinical signs and  symptoms consistent with COVID-19, or in those who have been in contact with the virus.  Negative results must be combined with clinical observations, patient history, and epidemiological information. The expected result is Negative.  Fact Sheet for Patients: HandmadeRecipes.com.cy  Fact Sheet for Healthcare Providers: FuneralLife.at  This test is not yet approved or cleared by the [place] FDA and  has been authorized for detection and/or diagnosis of SARS-CoV-2 by FDA under an Emergency Use Authorization (EUA).  This EUA will remain in effect (meaning this test can be used) for the duration of  the COV                          ID-19 declaration under Section 564(b)(1) of the Act, 21 U.S.C. section 360bbb-3(b)(1), unless the authorization is terminated or revoked sooner.    Office Visit on 01/08/2022  Component Date Value Ref Range Status   Hemoglobin A1C 01/08/2022 7.1 (A)  4.0 -  5.6 % Final   HbA1c POC (<> result, manual entry) 01/08/2022 7.1  4.0 - 5.6 % Final   HbA1c, POC (prediabetic range) 01/08/2022 7.1 (A)  5.7 - 6.4 % Final   HbA1c, POC (controlled diabetic ra* 01/08/2022 7.1 (A)  0.0 - 7.0 % Final   POC Glucose 01/08/2022 169 (A)  70 - 99 mg/dl Final  Office Visit on 12/04/2021  Component Date Value Ref Range Status  Hgb A1c MFr Bld 12/04/2021 7.0 (H)  4.8 - 5.6 % Final   Comment:          Prediabetes: 5.7 - 6.4          Diabetes: >6.4          Glycemic control for adults with diabetes: <7.0    Est. average glucose Bld gHb Est-m* 12/04/2021 154  mg/dL Final   WBC 12/04/2021 5.0  3.4 - 10.8 x10E3/uL Final   RBC 12/04/2021 5.09  4.14 - 5.80 x10E6/uL Final   Hemoglobin 12/04/2021 14.3  13.0 - 17.7 g/dL Final   Hematocrit 12/04/2021 43.0  37.5 - 51.0 % Final   MCV 12/04/2021 85  79 - 97 fL Final   MCH 12/04/2021 28.1  26.6 - 33.0 pg Final   MCHC 12/04/2021 33.3  31.5 - 35.7 g/dL Final   RDW 12/04/2021 12.8  11.6 - 15.4 % Final   Platelets 12/04/2021 240  150 - 450 x10E3/uL Final   Glucose 12/04/2021 157 (H)  70 - 99 mg/dL Final   BUN 12/04/2021 14  8 - 27 mg/dL Final   Creatinine, Ser 12/04/2021 1.03  0.76 - 1.27 mg/dL Final   eGFR 12/04/2021 83  >59 mL/min/1.73 Final   BUN/Creatinine Ratio 12/04/2021 14  10 - 24 Final   Sodium 12/04/2021 140  134 - 144 mmol/L Final   Potassium 12/04/2021 4.5  3.5 - 5.2 mmol/L Final   Chloride 12/04/2021 105  96 - 106 mmol/L Final   CO2 12/04/2021 22  20 - 29 mmol/L Final   Calcium 12/04/2021 9.4  8.6 - 10.2 mg/dL Final   Total Protein 12/04/2021 7.0  6.0 - 8.5 g/dL Final   Albumin 12/04/2021 4.7  3.8 - 4.9 g/dL Final   Globulin, Total 12/04/2021 2.3  1.5 - 4.5 g/dL Final   Albumin/Globulin Ratio 12/04/2021 2.0  1.2 - 2.2 Final   Bilirubin Total 12/04/2021 0.3  0.0 - 1.2 mg/dL Final   Alkaline Phosphatase 12/04/2021 74  44 - 121 IU/L Final   AST 12/04/2021 22  0 - 40 IU/L Final   ALT 12/04/2021 53 (H)  0 - 44 IU/L  Final    Blood Alcohol level:  Lab Results  Component Value Date   ETH 36 (H) 05/16/2022   ETH <10 86/38/1771    Metabolic Disorder Labs: Lab Results  Component Value Date   HGBA1C 6.6 (H) 05/16/2022   MPG 143 05/16/2022   MPG 154.2 01/20/2022   Lab Results  Component Value Date   PROLACTIN 13.0 03/17/2020   Lab Results  Component Value Date   CHOL 126 05/16/2022   TRIG 219 (H) 05/16/2022   HDL 45 05/16/2022   CHOLHDL 2.8 05/16/2022   VLDL 44 (H) 05/16/2022   LDLCALC 37 05/16/2022   LDLCALC 56 01/20/2022    Therapeutic Lab Levels: No results found for: "LITHIUM" No results found for: "VALPROATE" No results found for: "CBMZ"  Physical Findings   AIMS    Flowsheet Row Admission (Discharged) from OP Visit from 09/07/2019 in [facility] Admission (Discharged) from OP Visit from 01/12/2019 in [place] [facility] Admission (Discharged) from OP Visit from 08/15/2018 in [facility] Admission (Discharged) from OP Visit from 10/02/2017 in [facility] Admission (Discharged) from 06/22/2017 in [facility]  AIMS Total Score 0 0 0 0 0      AUDIT  [facility] Office Visit from 08/27/2020 in [facility] Admission (Discharged) from OP Visit from 03/18/2020 in [facility] Admission (Discharged) from 12/10/2019 in [facility] Admission (Discharged) from OP Visit from 09/07/2019 in [facility] Admission (Discharged) from OP Visit from 01/12/2019 in [facility]  Alcohol Use Disorder Identification Test Final Score (AUDIT) _0 0      GAD-7    Flowsheet Row Clinical Support from 02/24/2022 in [facility] Office Visit from 01/08/2022 in  [facility] Visit from 11/19/2021 in [place] from 07/07/2021 in [facility] Office Visit from 08/27/2020 in [facility]  Total GAD-7 Score _1 BTD1-7    [place] [facility] from 05/16/2022 in [facility] Office Visit from 03/06/2022 in [place] from 02/24/2022 in [facility] Office Visit from 01/08/2022 in [facility] Video Visit from 11/19/2021 in [facility]  PHQ-2 Total Score 4 0 0 4 1  PHQ-9 Total Score 11 0 _2 Flowsheet Row ED from 05/16/2022 in [facility] ED from 01/20/2022 in [facility] from 10/10/2021 in [facility] No Risk No Risk        Musculoskeletal  Strength & Muscle Tone: within normal limits Gait & Station: normal Patient leans: N/A  Psychiatric Specialty Exam  Presentation  General Appearance:  Appropriate for Environment; Casual; Fairly Groomed  Eye Contact: Good  Speech: Clear and Coherent; Normal Rate  Speech Volume: Normal  Handedness: Right   Mood and Affect  Mood: -- (good)  Affect: Appropriate; Congruent; Full Range   Thought Process  Thought Processes: Coherent; Goal Directed; Linear  Descriptions of Associations:Intact  Orientation:Full (Time, Place and Person)  Thought Content:Logical  Diagnosis of Schizophrenia or Schizoaffective disorder in past: No    Hallucinations:Hallucinations: None   Ideas of Reference:None  Suicidal Thoughts:Suicidal Thoughts: No   Homicidal Thoughts:Homicidal Thoughts: No    Sensorium  Memory: Immediate Good  Judgment: Fair  Insight: Fair   Executive Functions   Concentration: Good  Attention Span: Good  Recall: Good  Fund of Knowledge: Good  Language: Good   Psychomotor Activity  Psychomotor Activity: Psychomotor Activity: Normal    Assets  Assets: Communication Skills; Desire for Improvement   Sleep  Sleep: Sleep: Good   Physical Exam  Physical Exam Constitutional:      General: He is not in acute distress.    Appearance: Normal appearance. He is not ill-appearing, toxic-appearing or diaphoretic.  HENT:     Head: Normocephalic and atraumatic.  Pulmonary:     Effort: Pulmonary effort is normal.  Neurological:     Mental Status: He is alert.    Review of Systems  Constitutional:  Negative for chills.  Respiratory:  Negative for shortness of breath.   Cardiovascular:  Negative for chest pain.  Gastrointestinal:  Negative for constipation, diarrhea, nausea and vomiting.   Blood pressure (!) 125/95, pulse 64, temperature 97.7 F (36.5 C), temperature source Oral, resp. rate 18, SpO2 100 %. There is no height or weight on file to calculate  BMI.  Treatment Plan Summary: Daily contact with patient to assess and evaluate symptoms and progress in treatment and Medication management   Patient appears to be in the action stage of his Etoh and cocaine cessation. Patient Etoh use appear to be the crux of his cocaine use as well, as it was likely inducing depression and the cocaine was used to combat this. However, patient was only drinking out of the likely positive dopamine affect. Initially patient endorsed interest in Naltrexone, however declined starting it at this time. Patient has never been on the medication before.  CIWAs 0 for >48hrs, no sxs of EtOH w/d.  Although the last dose of his ativan taper is 11/23 AM, patient requested to be dc'd 11/22 PM, for the holidays. Because patient has had no w/d sxs per above, missing the last ativan dose on 11/23 AM would not likely make a difference. Planning to DC patient 11/22  in the evening after his 2nd to last dose of ativan.  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

## 2022-05-19 NOTE — ED Notes (Signed)
Pt is in the bed sleeping. Respirations are even and unlabored. No acute distress noted. Will continue to monitor for safety. 
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
## 2022-05-20 ENCOUNTER — Encounter (HOSPITAL_COMMUNITY): Payer: Medicaid Other | Admitting: Psychiatry

## 2022-05-20 DIAGNOSIS — E119 Type 2 diabetes mellitus without complications: Secondary | ICD-10-CM | POA: Diagnosis not present

## 2022-05-20 DIAGNOSIS — Z9151 Personal history of suicidal behavior: Secondary | ICD-10-CM | POA: Diagnosis not present

## 2022-05-20 DIAGNOSIS — I10 Essential (primary) hypertension: Secondary | ICD-10-CM | POA: Diagnosis not present

## 2022-05-20 DIAGNOSIS — F1020 Alcohol dependence, uncomplicated: Secondary | ICD-10-CM | POA: Diagnosis not present

## 2022-05-20 MED ORDER — LORAZEPAM 1 MG PO TABS
1.0000 mg | ORAL_TABLET | Freq: Two times a day (BID) | ORAL | Status: AC
  Administered 2022-05-20: 1 mg via ORAL
  Filled 2022-05-20: qty 1

## 2022-05-20 MED ORDER — ADULT MULTIVITAMIN W/MINERALS CH: 1.0000 | ORAL_TABLET | Freq: Every day | ORAL | 0 refills | Status: AC

## 2022-05-20 MED ORDER — VITAMIN D 25 MCG (1000 UNIT) PO TABS: 5000.0000 [IU] | ORAL_TABLET | ORAL | 0 refills | Status: AC

## 2022-05-20 MED ORDER — VITAMIN B-1 100 MG PO TABS: 100.0000 mg | ORAL_TABLET | Freq: Every day | ORAL | 0 refills | Status: AC

## 2022-05-20 MED ORDER — LORAZEPAM 1 MG PO TABS
1.0000 mg | ORAL_TABLET | Freq: Two times a day (BID) | ORAL | Status: DC
  Administered 2022-05-20: 1 mg via ORAL

## 2022-05-20 NOTE — ED Notes (Signed)
Patient  sleeping in no acute stress. RR even and unlabored .Environment secured .Will continue to monitor for safely.

## 2022-05-20 NOTE — ED Notes (Signed)
Pt is in the bed sleeping. Respirations are even and unlabored. No acute distress noted. Will continue to monitor for safety. 

## 2022-05-20 NOTE — Progress Notes (Signed)
Patient is attending group run by Education officer, museum.

## 2022-05-20 NOTE — ED Notes (Signed)
Patient is awake and alert on unit.  He is scheduled for discharge at approximately 4pm this evening.  Patient attended a recovery meeting this morning with RN.  He is calm and cooperative with care and appears motivated for recovery.  He is positive about making changes in his life and is planning on following up with treatment once discharged

## 2022-05-29 ENCOUNTER — Ambulatory Visit (HOSPITAL_COMMUNITY)
Admission: EM | Admit: 2022-05-29 | Discharge: 2022-05-29 | Disposition: A | Discharge status: Home / Self Care | Payer: Medicaid Other

## 2022-05-29 ENCOUNTER — Encounter (HOSPITAL_COMMUNITY): Payer: Self-pay

## 2022-05-29 ENCOUNTER — Ambulatory Visit (HOSPITAL_COMMUNITY): Payer: Self-pay

## 2022-05-29 DIAGNOSIS — K649 Unspecified hemorrhoids: Secondary | ICD-10-CM

## 2022-05-29 NOTE — Discharge Instructions (Addendum)
You can continue the epsom salt soaks as needed  Continue to eat a healthy diet with lots of fiber, lots of fluids. If needed try an over the counter stool softener  You can try Preparation H (over the counter) as needed  Please return with any concerns

## 2022-05-29 NOTE — ED Triage Notes (Signed)
Pt is here for hemmoroids  x 4days

## 2022-05-29 NOTE — ED Provider Notes (Signed)
[place]    CSN: [id number] Arrival date & time: 05/29/22  1043     History   Chief Complaint Chief Complaint  Patient presents with   Hemmoroid    HPI [name] is a 61 y.o. male.  Presents with possible hemorrhoid for 4 days  Reports some straining with bowel movement about 5 days ago.  Since then he has been having normal BM Denies any blood in the stool or with wiping At first had some pain with BM  This morning he tried Epsom salt soak which helped the pain a lot, reports now 0/10 He had made an appointment and wanted to follow through although symptoms have resolved  History of hemorrhoids many years ago  Past Medical History:  Diagnosis Date   Alcohol dependence ([place])    Allergies    Arthritis    Chest pain    Chronic lower back pain    Chronic pain of right wrist    Coronary artery disease    a. Multiple prior caths/PCI. Cath 2013 with possible spasm of RCA, 70% ISR of mid LCx with subsequent DES to mLCx and prox LCX. b. H/o microvascular angina. c. Recurrent angina 08/2014 - s/p PTCA/DES to prox Cx, PTCA/CBA to OM1.  c. LHC 06/10/15 with patent stents and some ISR in LCX and OM-1 that was not flow limiting --> Rx    Dyslipidemia    a. Intolerant to many statins except tolerating Livalo.   GERD (gastroesophageal reflux disease)    H/O cardiac catheterization 10/25/2018   Heart attack [facility])    Hypertension    Myocardial infarction ([place]) ~ 2010   S/P angioplasty with stent, DES, to proximal and mid LCX 12/15/11 12/15/2011   S/P foot surgery, right 04/2021   Shoulder pain    Stroke [facility])    pt. reports had a stroke around time of MI 2010   Type II diabetes mellitus ([place])    Unstable angina [facility])     Patient Active Problem List   Diagnosis Date Noted   Alcohol dependence ([place]) 05/16/2022   Low energy 03/06/2022   Moderate alcohol dependence in early remission ([place]) 02/24/2022   Suicidal ideation 07/07/2021   Generalized anxiety disorder  07/07/2021   Other allergic rhinitis 09/09/2020   Adverse reaction to food, subsequent encounter 09/09/2020   History of bee sting allergy 09/09/2020   Alcohol dependence with alcohol-induced mood disorder ([place]) 03/19/2020   MDD (major depressive disorder), recurrent episode, severe ([place]) 03/18/2020   History of substance abuse ([place]) 01/04/2020   Severe 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 features ([place]) 12/10/2019   Tinea cruris 07/06/2019   Hemoglobin A1c less than 7.0% 07/06/2019   Muscle strain of wrist 04/05/2019   Pain in both wrists 04/05/2019   Anxiety 04/05/2019   Prediabetes 04/05/2019   MDD (major depressive disorder) 01/12/2019   Chest pain 10/25/2018   MDD (major depressive disorder), recurrent severe, without psychosis ([place]) 08/15/2018   Influenza A 07/17/2017   Lactic acidosis    Cough    Nausea and vomiting    Severe episode of recurrent major depressive disorder, without psychotic features ([place])    MDD (major depressive disorder), single episode, severe with psychotic features ([place]) 06/27/2017   MDD (major depressive disorder), severe ([place]) 06/23/2017   Essential hypertension 01/13/2016   Unstable angina ([name])    ED (erectile dysfunction) 02/15/2014   Atypical chest pain 12/14/2011   Allergy to contrast media (used for diagnostic x-rays) 05/12/2011  Dyslipidemia, goal LDL below 70 05/09/2011   DM2 (diabetes mellitus, type 2) ([place]) 07/10/2010   CAD S/P percutaneous coronary angioplasty 07/10/2010    Past Surgical History:  Procedure Laterality Date   CARDIAC CATHETERIZATION  06/15/2002   LAD with prox 40% stenosis, norma L main, Cfx with 25% lesion, RCA with long mid 25% stenosis (Dr. Vita Barley)   CARDIAC CATHETERIZATION  04/01/2010   normal L main, LAD wit mild stenosis, L Cfx with 70% in-stent restenosis, RCA with 70% in-stent restenosis, LVEF >60% (Dr. K. Mali Hilty) - cutting ballon arthrectomy to RCA & Cfx (Dr. Rockne Menghini)   CARDIAC  CATHETERIZATION  08/25/2010   preserved global LV contractility; multivessel CAD, diffuse 90-95% in-stent restenosis in prox placed Cfx stent - cutting balloon arthrectomy in Cfx with multiple dilatations 90-95% to 0% stenosis (Dr. Corky Downs)   CARDIAC CATHETERIZATION  01/26/2011   PCI & stenting of aggresive in-stent restenosis within previously stented AV groove Cfx with 3.0x5m Taxus DES (previous stents were Promus) (Dr. JAdora Fridge   CARDIAC CATHETERIZATION  05/11/2011   preserved LV function, 40% mid LAD stenosis, 30-40% narrowing proximal to stented semgnet of prox Cfx, patent mid RCA stent with smooth 20% narrowing in distal RCA (Dr. TCorky Downs   CARDIAC CATHETERIZATION  12/15/2011   PCI & stenting of proximal & mid Cfx with DES - 3.0x164min proximal, 3.0x1550mn mid (Dr. J. Adora Fridge CARChristmasA 06/10/2015   Procedure: Left Heart Cath and Coronary Angiography;  Surgeon: DanJolaine ArtistD;  Location: [facility];  Service: Cardiovascular;  Laterality: N/A;   CARDIAC CATHETERIZATION  04/65/46/5035cardiometabolic testing  08/13/63/6812good exercise effort, peak VO2 79% predicted with normal VO2 HR curves (mild deconditioning)   COLONOSCOPY  12/2012   diminutive hyperplastic sigmoid poyp so repeat routine 2024   CORONARY BALLOON ANGIOPLASTY N/A 10/25/2018   Procedure: CORONARY BALLOON ANGIOPLASTY;  Surgeon: VarJettie BoozeD;  Location: [facility];  Service: Cardiovascular;  Laterality: N/A;   CORONARY BALLOON ANGIOPLASTY N/A 09/29/2019   Procedure: CORONARY BALLOON ANGIOPLASTY;  Surgeon: EndNelva BushD;  Location: [facility];  Service: Cardiovascular;  Laterality: N/A;   EXCISIONAL HEMORRHOIDECTOMY  1984   INTRAVASCULAR ULTRASOUND/IVUS N/A 09/29/2019   Procedure: Intravascular Ultrasound/IVUS;  Surgeon: EndNelva BushD;  Location: [facility];  Service: Cardiovascular;  Laterality: N/A;   LEFT HEART CATH AND CORONARY ANGIOGRAPHY N/A  10/25/2018   Procedure: LEFT HEART CATH AND CORONARY ANGIOGRAPHY;  Surgeon: VarJettie BoozeD;  Location: [facility];  Service: Cardiovascular;  Laterality: N/A;   LEFT HEART CATH AND CORONARY ANGIOGRAPHY N/A 09/29/2019   Procedure: LEFT HEART CATH AND CORONARY ANGIOGRAPHY;  Surgeon: EndNelva BushD;  Location: [facility];  Service: Cardiovascular;  Laterality: N/A;   LEFT HEART CATHETERIZATION WITH CORONARY ANGIOGRAM N/A 05/11/2011   Procedure: LEFT HEART CATHETERIZATION WITH CORONARY ANGIOGRAM;  Surgeon: ThoTroy SineD;  [facility];  Service: Cardiovascular;  Laterality: N/A;  Possible percutaneous coronary intervention, possible IVUS   LEFT HEART CATHETERIZATION WITH CORONARY ANGIOGRAM N/A 12/15/2011   Procedure: LEFT HEART CATHETERIZATION WITH CORONARY ANGIOGRAM;  Surgeon: JonLorretta HarpD;  [facility];  Service: Cardiovascular;  Laterality: N/A;   LEFT HEART CATHETERIZATION WITH CORONARY ANGIOGRAM N/A 09/05/2014   Procedure: LEFT HEART CATHETERIZATION WITH CORONARY ANGIOGRAM;  Surgeon: ChrBurnell BlanksD;  [facility];  Service: Cardiovascular;  Laterality: N/A;   LIPOMA EXCISION     back of the head   NM MYOCAR PERF WALL MOTION  02/2012   lexiscan myoview; mild perfusion defect in mid inferolateral & basal inferolateral region (infarct/scar); EF 52%, abnormal but ow risk scan   PERCUTANEOUS CORONARY STENT INTERVENTION (PCI-S)  09/05/2014   Procedure: PERCUTANEOUS CORONARY STENT INTERVENTION (PCI-S);  Surgeon: Burnell Blanks, MD;  Location: [facility] CATH LAB;  Service: Cardiovascular;;   TONSILLECTOMY         Home Medications    Prior to Admission medications   Medication Sig Start Date End Date Taking? Authorizing Provider  Accu-Chek Softclix Lancets lancets SMARTSIG:1 Topical 4 Times Daily 12/14/20   [provider]  amLODipine (NORVASC) 10 MG tablet Take 1 tablet by mouth once daily 02/04/22   Hilty, Nadean Corwin, MD  aspirin 81 MG EC tablet Take 1 tablet (81 mg total) by mouth daily. (May buy from over the counter): Swallow whole for heart health Patient taking differently: Take 81 mg by mouth at bedtime. (May buy from over the counter): Swallow whole for heart health 07/10/21   Ival Bible, MD  blood glucose meter kit and supplies KIT Dispense based on patient and insurance preference. Use up to four times daily as directed. (FOR ICD-9 250.00, 250.01). 12/14/20   Vanessa Kick, MD  Blood Pressure Monitor KIT 1 kit by Does not apply route daily. 02/12/21   Vevelyn Francois, NP  cholecalciferol (VITAMIN D3) 25 MCG (1000 UNIT) tablet Take 5 tablets (5,000 Units total) by mouth every other day. 05/20/22 06/19/22  Merrily Brittle, DO  clopidogrel (PLAVIX) 75 MG tablet Take 1 tablet by mouth once daily 02/20/22   Pixie Casino, MD  cyanocobalamin 1000 MCG tablet Take 1,000 mcg by mouth daily.    [provider]  EPINEPHrine 0.3 mg/0.3 mL IJ SOAJ injection Inject 0.3 mg into the muscle as needed for anaphylaxis. 05/07/21   Clemon Chambers, MD  ezetimibe (ZETIA) 10 MG tablet Take 1 tablet (10 mg total) by mouth daily. 03/06/22 06/04/22  Fenton Foy, NP  glucose blood (ACCU-CHEK GUIDE) test strip USE AS DIRECTED to check blood sugars three times a day. 04/30/22   Fenton Foy, NP  LIVALO 4 MG TABS Take 1 tablet by mouth once daily 02/16/22   Croitoru, Dani Gobble, MD  metFORMIN (GLUCOPHAGE) 1000 MG tablet Take 1 tablet (1,000 mg total) by mouth 2 (two) times daily with a meal. 01/08/22   Dorena Dew, FNP  metoprolol tartrate (LOPRESSOR) 100 MG tablet Take 1 tablet (100 mg total) by mouth 2 (two) times daily. 12/10/21 12/05/22  Pixie Casino, MD  Multiple Vitamin (MULTIVITAMIN WITH MINERALS) TABS tablet Take 1 tablet by mouth daily. 05/21/22 06/20/22  Merrily Brittle, DO  nitroGLYCERIN (NITROSTAT) 0.4 MG SL tablet Place 1 tablet (0.4 mg total) under the tongue every 5 (five) minutes x 3 doses as needed  for chest pain. 07/18/21   Hilty, Nadean Corwin, MD  Polyethyl Glycol-Propyl Glycol (SYSTANE) 0.4-0.3 % GEL ophthalmic gel Place 1 Application into both eyes daily as needed (For dry eyes).    [provider]  sildenafil (VIAGRA) 100 MG tablet Take 1 tablet (100 mg total) by mouth daily as needed. 03/06/22   Fenton Foy, NP  Testosterone (ANDROGEL PUMP) 20.25 MG/ACT (1.62%) GEL Place 1 Pump onto the skin daily. 04/08/22   Fenton Foy, NP  thiamine (VITAMIN B-1) 100 MG tablet Take 1 tablet (100 mg  total) by mouth daily. 05/20/22 06/19/22  Merrily Brittle, DO    Family History Family History  Problem Relation Age of Onset   Leukemia Mother    Prostate cancer Father    Cancer Brother    Coronary artery disease Paternal Grandmother    Cancer Paternal Grandfather    Migraines Neg Hx    Headache Neg Hx     Social History Social History   Tobacco Use   Smoking status: Former    Packs/day: 1.00    Years: 10.00    Total pack years: 10.00    Types: Cigarettes    Quit date: 10/07/2018    Years since quitting: 3.6   Smokeless tobacco: Never  Vaping Use   Vaping Use: Never used  Substance Use Topics   Alcohol use: Yes    Comment: 2 bottles of wine on 12/08/2019 (one occurrence)   Drug use: Not Currently    Types: Cocaine    Comment: last use several months ago     Allergies   Bee venom, Gadolinium, Shellfish allergy, Statins, and Testosterone cypionate   Review of Systems Review of Systems  As per HPI  Physical Exam Triage Vital Signs ED Triage Vitals [05/29/22 1117]  Enc Vitals Group     BP (!) 131/90     Pulse Rate (!) 54     Resp 12     Temp 98 F (36.7 C)     Temp Source Oral     SpO2 98 %     Weight      Height      Head Circumference      Peak Flow      Pain Score 0     Pain Loc      Pain Edu?      Excl. in [place]?    No data found.  Updated Vital Signs BP (!) 131/90 (BP Location: Right Arm)   Pulse (!) 54   Temp 98 F (36.7 C) (Oral)   Resp  12   SpO2 98%    Physical Exam Vitals and nursing note reviewed. Exam conducted with a chaperone present Kendrick Fries CMA).  Constitutional:      General: He is not in acute distress.    Appearance: Normal appearance.  HENT:     Mouth/Throat:     Pharynx: Oropharynx is clear.  Cardiovascular:     Rate and Rhythm: Normal rate and regular rhythm.  Pulmonary:     Effort: Pulmonary effort is normal.  Genitourinary:      Comments: Small soft nontender external hemorrhoid. No bleeding noted.  Skin:    General: Skin is warm and dry.  Neurological:     Mental Status: He is alert and oriented to person, place, and time.      UC Treatments / Results  Labs (all labs ordered are listed, but only abnormal results are displayed) Labs Reviewed - No data to display  EKG  Radiology No results found.  Procedures Procedures   Medications Ordered in UC Medications - No data to display  Initial Impression / Assessment and Plan / UC Course  I have reviewed the triage vital signs and the nursing notes.  Pertinent labs & imaging results that were available during my care of the patient were reviewed by me and considered in my medical decision making (see chart for details).  May have had small hemorrhoid from straining, mostly resolved in appearance, fully resolved in symptoms Discussed continuing fiber and fluids, if needed  can use stool softener to keep BMs soft and non painful Discussed if symptoms recur or he develops swelling/burning/pain can try preparation H topical cream up to 4 times daily to area. Return precautions discussed. Patient agrees to plan  Final Clinical Impressions(s) / UC Diagnoses   Final diagnoses:  Hemorrhoids, unspecified hemorrhoid type     Discharge Instructions      You can continue the epsom salt soaks as needed  Continue to eat a healthy diet with lots of fiber, lots of fluids. If needed try an over the counter stool softener  You can try  Preparation H (over the counter) as needed  Please return with any concerns     ED Prescriptions   None    PDMP not reviewed this encounter.   Opal Dinning, [place], [place] 05/29/22 1222

## 2022-06-08 ENCOUNTER — Ambulatory Visit: Payer: Self-pay | Admitting: Nurse Practitioner

## 2022-06-18 ENCOUNTER — Ambulatory Visit: Payer: Medicaid Other | Admitting: Internal Medicine

## 2022-08-07 ENCOUNTER — Other Ambulatory Visit (HOSPITAL_COMMUNITY)
Admission: EM | Admit: 2022-08-07 | Discharge: 2022-08-12 | Disposition: A | Discharge status: Home / Self Care | Payer: Medicaid Other | Attending: Behavioral Health | Admitting: Behavioral Health

## 2022-08-07 DIAGNOSIS — E1159 Type 2 diabetes mellitus with other circulatory complications: Secondary | ICD-10-CM

## 2022-08-07 DIAGNOSIS — Z602 Problems related to living alone: Secondary | ICD-10-CM | POA: Insufficient documentation

## 2022-08-07 DIAGNOSIS — F411 Generalized anxiety disorder: Secondary | ICD-10-CM

## 2022-08-07 DIAGNOSIS — Z1152 Encounter for screening for COVID-19: Secondary | ICD-10-CM | POA: Insufficient documentation

## 2022-08-07 DIAGNOSIS — I2510 Atherosclerotic heart disease of native coronary artery without angina pectoris: Secondary | ICD-10-CM | POA: Insufficient documentation

## 2022-08-07 DIAGNOSIS — F1020 Alcohol dependence, uncomplicated: Secondary | ICD-10-CM | POA: Insufficient documentation

## 2022-08-07 DIAGNOSIS — F1410 Cocaine abuse, uncomplicated: Secondary | ICD-10-CM | POA: Insufficient documentation

## 2022-08-07 DIAGNOSIS — Z7984 Long term (current) use of oral hypoglycemic drugs: Secondary | ICD-10-CM | POA: Insufficient documentation

## 2022-08-07 DIAGNOSIS — E785 Hyperlipidemia, unspecified: Secondary | ICD-10-CM | POA: Insufficient documentation

## 2022-08-07 DIAGNOSIS — Z79899 Other long term (current) drug therapy: Secondary | ICD-10-CM | POA: Insufficient documentation

## 2022-08-07 DIAGNOSIS — I10 Essential (primary) hypertension: Secondary | ICD-10-CM | POA: Insufficient documentation

## 2022-08-07 DIAGNOSIS — F322 Major depressive disorder, single episode, severe without psychotic features: Secondary | ICD-10-CM

## 2022-08-07 LAB — POC SARS CORONAVIRUS 2 AG: SARSCOV2ONAVIRUS 2 AG: NEGATIVE

## 2022-08-07 LAB — LIPID PANEL
Cholesterol: 150 mg/dL (ref 0–200)
HDL: 47 mg/dL (ref >40)
LDL Cholesterol: 45 mg/dL (ref 0–99)
Total CHOL/HDL Ratio: 3.2 RATIO
Triglycerides: 288 mg/dL — ABNORMAL HIGH (ref <150)
VLDL: 58 mg/dL — ABNORMAL HIGH (ref 0–40)

## 2022-08-07 LAB — CBC WITH DIFFERENTIAL/PLATELET
Abs Immature Granulocytes: 0.02 10*3/uL (ref 0.00–0.07)
Basophils Absolute: 0 10*3/uL (ref 0.0–0.1)
Basophils Relative: 1 %
Eosinophils Absolute: 0.6 10*3/uL — ABNORMAL HIGH (ref 0.0–0.5)
Eosinophils Relative: 10 %
HCT: 42.8 % (ref 39.0–52.0)
Hemoglobin: 14.3 g/dL (ref 13.0–17.0)
Immature Granulocytes: 0 %
Lymphocytes Relative: 34 %
Lymphs Abs: 2.2 10*3/uL (ref 0.7–4.0)
MCH: 28.7 pg (ref 26.0–34.0)
MCHC: 33.4 g/dL (ref 30.0–36.0)
MCV: 85.8 fL (ref 80.0–100.0)
Monocytes Absolute: 0.4 10*3/uL (ref 0.1–1.0)
Monocytes Relative: 7 %
Neutro Abs: 3.2 10*3/uL (ref 1.7–7.7)
Neutrophils Relative %: 48 %
Platelets: 219 10*3/uL (ref 150–400)
RBC: 4.99 MIL/uL (ref 4.22–5.81)
RDW: 12.2 % (ref 11.5–15.5)
WBC: 6.5 10*3/uL (ref 4.0–10.5)
nRBC: 0 % (ref 0.0–0.2)

## 2022-08-07 LAB — ETHANOL: Alcohol, Ethyl (B): <10 mg/dL (ref <10)

## 2022-08-07 LAB — RESP PANEL BY RT-PCR (RSV, FLU A&B, COVID)  RVPGX2
Influenza A by PCR: NEGATIVE
Influenza B by PCR: NEGATIVE
Resp Syncytial Virus by PCR: NEGATIVE
SARS Coronavirus 2 by RT PCR: NEGATIVE

## 2022-08-07 LAB — POCT URINE DRUG SCREEN - MANUAL ENTRY (I-SCREEN)
POC Amphetamine UR: NOT DETECTED
POC Buprenorphine (BUP): NOT DETECTED
POC Cocaine UR: POSITIVE — AB
POC Marijuana UR: NOT DETECTED
POC Methadone UR: NOT DETECTED
POC Methamphetamine UR: NOT DETECTED
POC Morphine: NOT DETECTED
POC Oxazepam (BZO): NOT DETECTED
POC Oxycodone UR: NOT DETECTED
POC Secobarbital (BAR): NOT DETECTED

## 2022-08-07 LAB — HEMOGLOBIN A1C
Hgb A1c MFr Bld: 6.6 % — ABNORMAL HIGH (ref 4.8–5.6)
Mean Plasma Glucose: 142.72 mg/dL

## 2022-08-07 LAB — TSH: TSH: 1.299 u[IU]/mL (ref 0.350–4.500)

## 2022-08-07 MED ORDER — LORAZEPAM 1 MG PO TABS
1.0000 mg | ORAL_TABLET | Freq: Two times a day (BID) | ORAL | Status: AC
  Administered 2022-08-10 (×2): 1 mg via ORAL
  Filled 2022-08-07 (×2): qty 1

## 2022-08-07 MED ORDER — LOPERAMIDE HCL 2 MG PO CAPS: 2.0000 mg | ORAL_CAPSULE | ORAL | Status: AC | PRN

## 2022-08-07 MED ORDER — LORAZEPAM 1 MG PO TABS
1.0000 mg | ORAL_TABLET | Freq: Four times a day (QID) | ORAL | Status: AC
  Administered 2022-08-07 – 2022-08-08 (×6): 1 mg via ORAL
  Filled 2022-08-07 (×6): qty 1

## 2022-08-07 MED ORDER — HYDROXYZINE HCL 25 MG PO TABS
25.0000 mg | ORAL_TABLET | Freq: Three times a day (TID) | ORAL | Status: DC | PRN
  Administered 2022-08-07: 25 mg via ORAL
  Filled 2022-08-07: qty 1

## 2022-08-07 MED ORDER — THIAMINE HCL 100 MG/ML IJ SOLN
100.0000 mg | Freq: Once | INTRAMUSCULAR | Status: AC
  Administered 2022-08-07: 100 mg via INTRAMUSCULAR
  Filled 2022-08-07: qty 2

## 2022-08-07 MED ORDER — THIAMINE MONONITRATE 100 MG PO TABS
100.0000 mg | ORAL_TABLET | Freq: Every day | ORAL | Status: DC
  Administered 2022-08-08 – 2022-08-12 (×5): 100 mg via ORAL
  Filled 2022-08-07 (×5): qty 1

## 2022-08-07 MED ORDER — ALUM & MAG HYDROXIDE-SIMETH 200-200-20 MG/5ML PO SUSP: 30.0000 mL | ORAL | Status: DC | PRN

## 2022-08-07 MED ORDER — TRAZODONE HCL 50 MG PO TABS
50.0000 mg | ORAL_TABLET | Freq: Every evening | ORAL | Status: DC | PRN
  Administered 2022-08-07 – 2022-08-11 (×5): 50 mg via ORAL
  Filled 2022-08-07 (×5): qty 1

## 2022-08-07 MED ORDER — MAGNESIUM HYDROXIDE 400 MG/5ML PO SUSP: 30.0000 mL | Freq: Every day | ORAL | Status: DC | PRN

## 2022-08-07 MED ORDER — ACETAMINOPHEN 325 MG PO TABS
650.0000 mg | ORAL_TABLET | Freq: Four times a day (QID) | ORAL | Status: DC | PRN
  Administered 2022-08-09 – 2022-08-12 (×6): 650 mg via ORAL
  Filled 2022-08-07 (×6): qty 2

## 2022-08-07 MED ORDER — EZETIMIBE 10 MG PO TABS
10.0000 mg | ORAL_TABLET | Freq: Every day | ORAL | Status: DC
  Administered 2022-08-07 – 2022-08-12 (×6): 10 mg via ORAL
  Filled 2022-08-07 (×6): qty 1

## 2022-08-07 MED ORDER — ASPIRIN 81 MG PO TBEC
81.0000 mg | DELAYED_RELEASE_TABLET | Freq: Every day | ORAL | Status: DC
  Administered 2022-08-08 – 2022-08-12 (×5): 81 mg via ORAL
  Filled 2022-08-07 (×5): qty 1

## 2022-08-07 MED ORDER — VITAMIN D 25 MCG (1000 UNIT) PO TABS
5000.0000 [IU] | ORAL_TABLET | ORAL | Status: DC
  Administered 2022-08-08 – 2022-08-12 (×3): 5000 [IU] via ORAL
  Filled 2022-08-07 (×3): qty 5

## 2022-08-07 MED ORDER — LORAZEPAM 1 MG PO TABS
1.0000 mg | ORAL_TABLET | Freq: Three times a day (TID) | ORAL | Status: AC
  Administered 2022-08-09 (×3): 1 mg via ORAL
  Filled 2022-08-07 (×3): qty 1

## 2022-08-07 MED ORDER — METFORMIN HCL 500 MG PO TABS
1000.0000 mg | ORAL_TABLET | Freq: Two times a day (BID) | ORAL | Status: DC
  Administered 2022-08-07 – 2022-08-12 (×10): 1000 mg via ORAL
  Filled 2022-08-07 (×10): qty 2

## 2022-08-07 MED ORDER — LORAZEPAM 1 MG PO TABS
1.0000 mg | ORAL_TABLET | Freq: Every day | ORAL | Status: AC
  Administered 2022-08-11: 1 mg via ORAL
  Filled 2022-08-07: qty 1

## 2022-08-07 MED ORDER — LORAZEPAM 1 MG PO TABS: 1.0000 mg | ORAL_TABLET | Freq: Four times a day (QID) | ORAL | Status: AC | PRN

## 2022-08-07 MED ORDER — CLOPIDOGREL BISULFATE 75 MG PO TABS
75.0000 mg | ORAL_TABLET | Freq: Every day | ORAL | Status: DC
  Administered 2022-08-07 – 2022-08-12 (×6): 75 mg via ORAL
  Filled 2022-08-07 (×6): qty 1

## 2022-08-07 MED ORDER — ADULT MULTIVITAMIN W/MINERALS CH
1.0000 | ORAL_TABLET | Freq: Every day | ORAL | Status: DC
  Administered 2022-08-07 – 2022-08-12 (×6): 1 via ORAL
  Filled 2022-08-07 (×6): qty 1

## 2022-08-07 MED ORDER — ONDANSETRON 4 MG PO TBDP: 4.0000 mg | ORAL_TABLET | Freq: Four times a day (QID) | ORAL | Status: AC | PRN

## 2022-08-07 MED ORDER — NITROGLYCERIN 0.4 MG SL SUBL: 0.4000 mg | SUBLINGUAL_TABLET | SUBLINGUAL | Status: DC | PRN

## 2022-08-07 MED ORDER — METOPROLOL TARTRATE 50 MG PO TABS
100.0000 mg | ORAL_TABLET | Freq: Two times a day (BID) | ORAL | Status: DC
  Administered 2022-08-07 – 2022-08-12 (×10): 100 mg via ORAL
  Filled 2022-08-07 (×10): qty 2

## 2022-08-07 NOTE — Progress Notes (Signed)
   08/07/22 1449  [place] (Walk-ins at [facility] only)  How Did You Hear About [place]? Self  What Is the Reason for Your Visit/Call Today? Alcohol detox  How Long Has This Been Causing You Problems? > than 6 months  Have You Recently Had Any Thoughts About Hurting Yourself? No  Are You Planning to Commit Suicide/Harm Yourself At This time? No  Have you Recently Had Thoughts About [place]? No  Are You Planning To Harm Someone At This Time? No  Are you currently experiencing any auditory, visual or other hallucinations? No  What Do You Feel Would Help You the Most Today? Alcohol or Drug Use Treatment  If access to [facility] was not available, would you have sought care in the Emergency Department? No  Determination of Need Routine (7 days)  Options For Referral Medication Management;Outpatient Therapy

## 2022-08-07 NOTE — Group Note (Deleted)
Group Topic: Communication  Group Date: 08/07/2022 Start Time: 0930 End Time: 0945 Facilitators: Quentin Cornwall B  Department: [facility]  Number of Participants: 3  Group Focus: communication Treatment Modality:  Psychoeducation Interventions utilized were leisure development Purpose: improve communication skills   Name: [name] Date of Birth: October 05, 1960  MR: [id number]    Level of Participation: {THERAPIES; PSYCH GROUP PARTICIPATION [id number] Quality of Participation: {THERAPIES; PSYCH QUALITY OF PARTICIPATION:23992} Interactions with others: {THERAPIES; PSYCH INTERACTIONS:23993} Mood/Affect: {THERAPIES; PSYCH MOOD/AFFECT:23994} Triggers (if applicable): *** Cognition: {THERAPIES; PSYCH COGNITION:23995} Progress: {THERAPIES; PSYCH PROGRESS:23997} Response: *** Plan: {THERAPIES; PSYCH [id number]  Patients Problems:  Patient Active Problem List   Diagnosis Date Noted   Alcohol dependence ([place]) 05/16/2022   Low energy 03/06/2022   Moderate alcohol dependence in early remission ([place]) 02/24/2022   Suicidal ideation 07/07/2021   Generalized anxiety disorder 07/07/2021   Other allergic rhinitis 09/09/2020   Adverse reaction to food, subsequent encounter 09/09/2020   History of bee sting allergy 09/09/2020   Alcohol dependence with alcohol-induced mood disorder ([place]) 03/19/2020   MDD (major depressive disorder), recurrent episode, severe ([place]) 03/18/2020   History of substance abuse ([place]) 01/04/2020   Severe 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 features ([place]) 12/10/2019   Tinea cruris 07/06/2019   Hemoglobin A1c less than 7.0% 07/06/2019   Muscle strain of wrist 04/05/2019   Pain in both wrists 04/05/2019   Anxiety 04/05/2019   Prediabetes 04/05/2019   MDD (major depressive disorder) 01/12/2019   Chest pain 10/25/2018   MDD (major depressive disorder), recurrent severe, without psychosis ([place]) 08/15/2018   Influenza A 07/17/2017    Lactic acidosis    Cough    Nausea and vomiting    Severe episode of recurrent major depressive disorder, without psychotic features ([place])    MDD (major depressive disorder), single episode, severe with psychotic features ([place]) 06/27/2017   MDD (major depressive disorder), severe ([place]) 06/23/2017   Essential hypertension 01/13/2016   Unstable angina ([place])    ED (erectile dysfunction) 02/15/2014   Atypical chest pain 12/14/2011   Allergy to contrast media (used for diagnostic x-rays) 05/12/2011   Dyslipidemia, goal LDL below 70 05/09/2011   DM2 (diabetes mellitus, type 2) ([place]) 07/10/2010   CAD S/P percutaneous coronary angioplasty 07/10/2010

## 2022-08-07 NOTE — ED Notes (Signed)
Pt is in the bed sleeping. Respirations are even and unlabored. No acute distress noted. Will continue to monitor for safety. 

## 2022-08-07 NOTE — ED Notes (Signed)
Patient alert and oriented x 3. Denies SI/HI/AVH. Denies intent or plan to harm self or others. Routine conducted according to faculty protocol. Encourage patient to notify staff with any needs or concerns. Patient verbalized agreement and understanding. Will continue to monitor for safety.

## 2022-08-07 NOTE — ED Provider Notes (Cosign Needed Addendum)
[facility] Continuous Assessment Admission H&P  Date: 08/07/22 Patient Name: [name] MRN: [id number] Chief Complaint: requesting alcohol detox and anxiety  Diagnoses:  Final diagnoses:  GAD (generalized anxiety disorder)  Uncomplicated alcohol dependence ([place])    HPI: [name] is a 62 year old male patient with a past psychiatric history significant for MDD, GAD, cocaine use disorder, alcohol dependency, and suicidal ideations who [facility] unaccompanied with a chief complaint of requesting alcohol detox and anxiety.  Patient seen and evaluated face-to-face by this provider, chart reviewed and case discussed with Dr. Dwyane Dee. On evaluation, patient is alert and oriented x 4. His thought process is logical and goal oriented. His speech is clear and coherent at a moderate tone. His mood is anxious and affect is congruent. He has fair eye contact. He appears well groomed and is casually dressed.  He is calm and cooperative. He does not appear to be in acute acute distress. He reports drinking alcohol everyday, on average a pint of gin daily. He states that he has been drinking all night and that his last drink was at 9 AM this morning. He denies alcohol withdrawal symptoms at this time. He reports drinking alcohol since age 24. He reports a recovery/sobriety of 4 years, 11 years ago. He reports actively drinking for the past 7 years. He reports using cocaine when he is drinking alcohol. He reports using cocaine 1 or 2 times per month, on average he spends about $20.00. He reports last using cocain 3 days ago. He reports using cocaine since age 66. He last received alcohol detox here at the [facility] facility based crisis in November 2023 and last received residential treatment at [facility] a year ago. He denies outpatient psychiatry. He denies outpatient counseling. He states that he is not taking prescribed psychotropic  medications. He denies feeling depressed. PHQ-9 score 10. He reports a long history of anxiety. He states that his anxiety is triggered in the early mornings and he fears of going outside. He describes his anxiety symptoms as feeling nervous, heart racing, feelings of apprehension, and difficulty going outside. He resides alone and is widowed. He is currently unemployed and on disability. He has 2 adult children that he states live across the country. He states that he has no support. He states that he had an [place] sponsor but he died a year ago. He verbalizes readiness for treatment.    Total Time spent with patient: 45 minutes  Musculoskeletal  Strength & Muscle Tone: within normal limits Gait & Station: normal Patient leans: N/A  Psychiatric Specialty Exam  Presentation General Appearance:  Appropriate for Environment  Eye Contact: Fair  Speech: Clear and Coherent; Normal Rate  Speech Volume: Normal  Handedness: Right   Mood and Affect  Mood: Anxious  Affect: Congruent   Thought Process  Thought Processes: Coherent; Goal Directed  Descriptions of Associations:Intact  Orientation:Full (Time, Place and Person)  Thought Content:Logical  Diagnosis of Schizophrenia or Schizoaffective disorder in past: No   Hallucinations:Hallucinations: None  Ideas of Reference:None  Suicidal Thoughts:Suicidal Thoughts: No  Homicidal Thoughts:Homicidal Thoughts: No   Sensorium  Memory: Immediate Fair; Recent Fair; Remote Fair  Judgment: Fair  Insight: Fair   Community education officer  Concentration: Fair  Attention Span: Fair  Recall: [employer] of Knowledge: Fair  Language: Fair   Psychomotor Activity  Psychomotor Activity: Psychomotor Activity: Normal   Assets  Assets: Communication Skills; Desire for Improvement; Financial  Resources/Insurance; Housing; Leisure Time; Physical Health   Sleep  Sleep: Sleep: Fair Number of Hours of Sleep:  8   Nutritional Assessment (For OBS and FBC admissions only) Has the patient had a weight loss or gain of 10 pounds or more in the last 3 months?: No Has the patient had a decrease in food intake/or appetite?: No Does the patient have dental problems?: No Does the patient have eating habits or behaviors that may be indicators of an eating disorder including binging or inducing vomiting?: No Has the patient recently lost weight without trying?: 0 Has the patient been eating poorly because of a decreased appetite?: 0 Malnutrition Screening Tool Score: 0    Physical Exam HENT:     Head: Normocephalic.     Nose: Nose normal.  Eyes:     Conjunctiva/sclera: Conjunctivae normal.  Cardiovascular:     Rate and Rhythm: Normal rate.  Pulmonary:     Effort: Pulmonary effort is normal.  Musculoskeletal:        General: Normal range of motion.     Cervical back: Normal range of motion.  Neurological:     Mental Status: He is alert and oriented to person, place, and time.    Review of Systems  Constitutional: Negative.   HENT: Negative.    Eyes: Negative.   Respiratory: Negative.    Cardiovascular: Negative.   Gastrointestinal: Negative.   Genitourinary: Negative.   Musculoskeletal: Negative.   Neurological: Negative.   Endo/Heme/Allergies: Negative.   Psychiatric/Behavioral:  Positive for substance abuse. The patient is nervous/anxious.     Blood pressure 116/76, pulse 65, temperature (!) 97.5 F (36.4 C), temperature source Oral, resp. rate 18, SpO2 99 %. There is no height or weight on file to calculate BMI.  Past Psychiatric History: History of MDD, GAD, cocaine use disorder, alcohol dependency, and suicidal ideations.  Is the patient at risk to self? No  Has the patient been a risk to self in the past 6 months? No .    Has the patient been a risk to self within the distant past? No   Is the patient a risk to others? No   Has the patient been a risk to others in the past 6  months? No   Has the patient been a risk to others within the distant past? No   Past Medical History: per chart review, history of coronary disease, hypertension, dyslipidemia, diabetes type 2, and microvascular angina. He has had multiple PCIs, in-stent restenosis.  Family History: younger brother history of drug addition, oldest brother history of alcoholism and depression and oldest sister history of alcoholism and depression.   Social History: alcohol abuse and cocaine use. Unemployed.   Last Labs:  Admission on 05/16/2022, Discharged on 05/20/2022  Component Date Value Ref Range Status   SARS Coronavirus 2 by RT PCR 05/16/2022 NEGATIVE  NEGATIVE Final   Comment: (NOTE) SARS-CoV-2 target nucleic acids are NOT DETECTED.  The SARS-CoV-2 RNA is generally detectable in upper respiratory specimens during the acute phase of infection. The lowest concentration of SARS-CoV-2 viral copies this assay can detect is 138 copies/mL. A negative result does not preclude SARS-Cov-2 infection and should not be used as the sole basis for treatment or other patient management decisions. A negative result may occur with  improper specimen collection/handling, submission of specimen other than nasopharyngeal swab, presence of viral mutation(s) within the areas targeted by this assay, and inadequate number of viral copies(<138 copies/mL). A negative result must be combined  with clinical observations, patient history, and epidemiological information. The expected result is Negative.  Fact Sheet for Patients:  EntrepreneurPulse.com.au  Fact Sheet for Healthcare Providers:  IncredibleEmployment.be  This test is no                          t yet approved or cleared by the [place] FDA and  has been authorized for detection and/or diagnosis of SARS-CoV-2 by FDA under an Emergency Use Authorization (EUA). This EUA will remain  in effect (meaning this test can be  used) for the duration of the COVID-19 declaration under Section 564(b)(1) of the Act, 21 U.S.C.section 360bbb-3(b)(1), unless the authorization is terminated  or revoked sooner.       Influenza A by PCR 05/16/2022 NEGATIVE  NEGATIVE Final   Influenza B by PCR 05/16/2022 NEGATIVE  NEGATIVE Final   Comment: (NOTE) The Xpert Xpress SARS-CoV-2/FLU/RSV plus assay is intended as an aid in the diagnosis of influenza from Nasopharyngeal swab specimens and should not be used as a sole basis for treatment. Nasal washings and aspirates are unacceptable for Xpert Xpress SARS-CoV-2/FLU/RSV testing.  Fact Sheet for Patients: EntrepreneurPulse.com.au  Fact Sheet for Healthcare Providers: IncredibleEmployment.be  This test is not yet approved or cleared by the [place] FDA and has been authorized for detection and/or diagnosis of SARS-CoV-2 by FDA under an Emergency Use Authorization (EUA). This EUA will remain in effect (meaning this test can be used) for the duration of the COVID-19 declaration under Section 564(b)(1) of the Act, 21 U.S.C. section 360bbb-3(b)(1), unless the authorization is terminated or revoked.  Performed at [facility], [place] [address] [place], [place] [address]    WBC 05/16/2022 7.1  4.0 - 10.5 K/uL Final   RBC 05/16/2022 5.36  4.22 - 5.81 MIL/uL Final   Hemoglobin 05/16/2022 15.4  13.0 - 17.0 g/dL Final   HCT 05/16/2022 45.3  39.0 - 52.0 % Final   MCV 05/16/2022 84.5  80.0 - 100.0 fL Final   MCH 05/16/2022 28.7  26.0 - 34.0 pg Final   MCHC 05/16/2022 34.0  30.0 - 36.0 g/dL Final   RDW 05/16/2022 13.0  11.5 - 15.5 % Final   Platelets 05/16/2022 262  150 - 400 K/uL Final   nRBC 05/16/2022 0.0  0.0 - 0.2 % Final   Neutrophils Relative % 05/16/2022 51  % Final   Neutro Abs 05/16/2022 3.6  1.7 - 7.7 K/uL Final   Lymphocytes Relative 05/16/2022 33  % Final   Lymphs Abs 05/16/2022 2.3  0.7 - 4.0 K/uL Final   Monocytes  Relative 05/16/2022 7  % Final   Monocytes Absolute 05/16/2022 0.5  0.1 - 1.0 K/uL Final   Eosinophils Relative 05/16/2022 8  % Final   Eosinophils Absolute 05/16/2022 0.6 (H)  0.0 - 0.5 K/uL Final   Basophils Relative 05/16/2022 1  % Final   Basophils Absolute 05/16/2022 0.1  0.0 - 0.1 K/uL Final   Immature Granulocytes 05/16/2022 0  % Final   Abs Immature Granulocytes 05/16/2022 0.02  0.00 - 0.07 K/uL Final   Performed at [facility], [place] [address], [place], [place] [address]   Sodium 05/16/2022 140  135 - 145 mmol/L Final   Potassium 05/16/2022 4.8  3.5 - 5.1 mmol/L Final   Chloride 05/16/2022 100  98 - 111 mmol/L Final   CO2 05/16/2022 20 (L)  22 - 32 mmol/L Final   Glucose, Bld 05/16/2022 144 (  H)  70 - 99 mg/dL Final   Glucose reference range applies only to samples taken after fasting for at least 8 hours.   BUN 05/16/2022 16  6 - 20 mg/dL Final   Creatinine, Ser 05/16/2022 0.92  0.61 - 1.24 mg/dL Final   Calcium 05/16/2022 10.3  8.9 - 10.3 mg/dL Final   Total Protein 05/16/2022 6.8  6.5 - 8.1 g/dL Final   Albumin 05/16/2022 4.5  3.5 - 5.0 g/dL Final   AST 05/16/2022 30  15 - 41 U/L Final   ALT 05/16/2022 42  0 - 44 U/L Final   Alkaline Phosphatase 05/16/2022 61  38 - 126 U/L Final   Total Bilirubin 05/16/2022 0.5  0.3 - 1.2 mg/dL Final   GFR, Estimated 05/16/2022 >60  >60 mL/min Final   Comment: (NOTE) Calculated using the CKD-EPI Creatinine Equation (2021)    Anion gap 05/16/2022 20 (H)  5 - 15 Final   Performed at [place] [address], [place], [place] [address]   Hgb A1c MFr Bld 05/16/2022 6.6 (H)  4.8 - 5.6 % Final   Comment: (NOTE)         Prediabetes: 5.7 - 6.4         Diabetes: >6.4         Glycemic control for adults with diabetes: <7.0    Mean Plasma Glucose 05/16/2022 143  mg/dL Final   Comment: (NOTE) Performed At: [facility] [place], [place] [id number] Rush Farmer MD [id number]    Alcohol, Ethyl (B)  05/16/2022 36 (H)  <10 mg/dL Final   Comment: (NOTE) Lowest detectable limit for serum alcohol is 10 mg/dL.  For medical purposes only. Performed at [facility], [place] [address], [place], [place] [address]    Cholesterol 05/16/2022 126  0 - 200 mg/dL Final   Triglycerides 05/16/2022 219 (H)  <150 mg/dL Final   HDL 05/16/2022 45  >40 mg/dL Final   Total CHOL/HDL Ratio 05/16/2022 2.8  RATIO Final   VLDL 05/16/2022 44 (H)  0 - 40 mg/dL Final   LDL Cholesterol 05/16/2022 37  0 - 99 mg/dL Final   Comment:        Total Cholesterol/HDL:CHD Risk Coronary Heart Disease Risk Table                     Men   Women  1/2 Average Risk   3.4   3.3  Average Risk       5.0   4.4  2 X Average Risk   9.6   7.1  3 X Average Risk  23.4   11.0        Use the calculated Patient Ratio above and the CHD Risk Table to determine the patient's CHD Risk.        ATP III CLASSIFICATION (LDL):  <100     mg/dL   Optimal  100-129  mg/dL   Near or Above                    Optimal  130-159  mg/dL   Borderline  160-189  mg/dL   High  >190     mg/dL   Very High Performed at [facility] [address], [place], [place] [address]    TSH 05/16/2022 1.742  0.350 - 4.500 uIU/mL Final   Comment: Performed by a 3rd Generation assay with a functional sensitivity of <=0.01 uIU/mL. Performed at  [facility], [place] [address] [place], [place] [address]    POC Amphetamine UR 05/16/2022 None Detected  NONE DETECTED (Cut Off Level 1000 ng/mL) Final   POC Secobarbital (BAR) 05/16/2022 None Detected  NONE DETECTED (Cut Off Level 300 ng/mL) Final   POC Buprenorphine (BUP) 05/16/2022 None Detected  NONE DETECTED (Cut Off Level 10 ng/mL) Final   POC Oxazepam (BZO) 05/16/2022 None Detected  NONE DETECTED (Cut Off Level 300 ng/mL) Final   POC Cocaine UR 05/16/2022 None Detected  NONE DETECTED (Cut Off Level 300 ng/mL) Final   POC Methamphetamine UR 05/16/2022 None Detected  NONE DETECTED (Cut Off Level 1000  ng/mL) Final   POC Morphine 05/16/2022 None Detected  NONE DETECTED (Cut Off Level 300 ng/mL) Final   POC Methadone UR 05/16/2022 None Detected  NONE DETECTED (Cut Off Level 300 ng/mL) Final   POC Oxycodone UR 05/16/2022 None Detected  NONE DETECTED (Cut Off Level 100 ng/mL) Final   POC Marijuana UR 05/16/2022 None Detected  NONE DETECTED (Cut Off Level 50 ng/mL) Final   SARSCOV2ONAVIRUS 2 AG 05/16/2022 NEGATIVE  NEGATIVE Final   Comment: (NOTE) SARS-CoV-2 antigen NOT DETECTED.   Negative results are presumptive.  Negative results do not preclude SARS-CoV-2 infection and should not be used as the sole basis for treatment or other patient management decisions, including infection  control decisions, particularly in the presence of clinical signs and  symptoms consistent with COVID-19, or in those who have been in contact with the virus.  Negative results must be combined with clinical observations, patient history, and epidemiological information. The expected result is Negative.  Fact Sheet for Patients: HandmadeRecipes.com.cy  Fact Sheet for Healthcare Providers: FuneralLife.at  This test is not yet approved or cleared by the [place] FDA and  has been authorized for detection and/or diagnosis of SARS-CoV-2 by FDA under an Emergency Use Authorization (EUA).  This EUA will remain in effect (meaning this test can be used) for the duration of  the COV                          ID-19 declaration under Section 564(b)(1) of the Act, 21 U.S.C. section 360bbb-3(b)(1), unless the authorization is terminated or revoked sooner.     Glucose-Capillary 05/18/2022 134 (H)  70 - 99 mg/dL Final   Glucose reference range applies only to samples taken after fasting for at least 8 hours.  Office Visit on 03/06/2022  Component Date Value Ref Range Status   Testosterone 03/06/2022 261 (L)  264 - 916 ng/dL Final   Comment: Adult male reference  interval is based on a population of healthy nonobese males (BMI <30) between 10 and 98 years old. [place], [place] [phone]. PMID: [id number].    Vit D, 25-Hydroxy 03/06/2022 40.1  30.0 - 100.0 ng/mL Final   Comment: Vitamin D deficiency has been defined by the [place] practice guideline as a level of serum 25-OH vitamin D less than 20 ng/mL (1,2). The Endocrine Society went on to further define vitamin D insufficiency as a level between 21 and 29 ng/mL (2). 1. IOM (Institute of Medicine). 2010. Dietary reference    intakes for calcium and D. [place]: The    [employer]. 2. Holick MF, Binkley Avinger, Bischoff-Ferrari HA, et al.    Evaluation, treatment, and prevention of vitamin D    deficiency: an Endocrine Society clinical practice    guideline. JCEM.  2011 Jul; 96(7):1911-30.    Vitamin B-12 03/06/2022 543  232 - 1,245 pg/mL Final    Allergies: Bee venom, Gadolinium, Shellfish allergy, Statins, and Testosterone cypionate  Medications:  Facility Ordered Medications  Medication   acetaminophen (TYLENOL) tablet 650 mg   alum & mag hydroxide-simeth (MAALOX/MYLANTA) 200-200-20 MG/5ML suspension 30 mL   magnesium hydroxide (MILK OF MAGNESIA) suspension 30 mL   hydrOXYzine (ATARAX) tablet 25 mg   traZODone (DESYREL) tablet 50 mg   thiamine (VITAMIN B1) injection 100 mg   [START ON 08/08/2022] thiamine (VITAMIN B1) tablet 100 mg   multivitamin with minerals tablet 1 tablet   LORazepam (ATIVAN) tablet 1 mg   loperamide (IMODIUM) capsule 2-4 mg   ondansetron (ZOFRAN-ODT) disintegrating tablet 4 mg   LORazepam (ATIVAN) tablet 1 mg   Followed by   Derrill Memo ON 08/09/2022] LORazepam (ATIVAN) tablet 1 mg   Followed by   Derrill Memo ON 08/10/2022] LORazepam (ATIVAN) tablet 1 mg   Followed by   Derrill Memo ON 08/11/2022] LORazepam (ATIVAN) tablet 1 mg   PTA Medications  Medication Sig   metoprolol tartrate (LOPRESSOR) 100 MG tablet  Take 1 tablet (100 mg total) by mouth 2 (two) times daily.   EPINEPHrine 0.3 mg/0.3 mL IJ SOAJ injection Inject 0.3 mg into the muscle as needed for anaphylaxis.   aspirin 81 MG EC tablet Take 1 tablet (81 mg total) by mouth daily. (May buy from over the counter): Swallow whole for heart health (Patient taking differently: Take 81 mg by mouth at bedtime. (May buy from over the counter): Swallow whole for heart health)   nitroGLYCERIN (NITROSTAT) 0.4 MG SL tablet Place 1 tablet (0.4 mg total) under the tongue every 5 (five) minutes x 3 doses as needed for chest pain.   metFORMIN (GLUCOPHAGE) 1000 MG tablet Take 1 tablet (1,000 mg total) by mouth 2 (two) times daily with a meal.   Polyethyl Glycol-Propyl Glycol (SYSTANE) 0.4-0.3 % GEL ophthalmic gel Place 1 Application into both eyes daily as needed (For dry eyes).   amLODipine (NORVASC) 10 MG tablet Take 1 tablet by mouth once daily   LIVALO 4 MG TABS Take 1 tablet by mouth once daily   ezetimibe (ZETIA) 10 MG tablet Take 1 tablet (10 mg total) by mouth daily.   Testosterone (ANDROGEL PUMP) 20.25 MG/ACT (1.62%) GEL Place 1 Pump onto the skin daily.   glucose blood (ACCU-CHEK GUIDE) test strip USE AS DIRECTED to check blood sugars three times a day.    Medical Decision Making   Patient admitted to the [facility] health urgent care continuous assessment unit for alcohol detox and mood stabilization while he awaits placement in the [facility] for treatment. Patient is voluntary.   Lab Orders         Resp panel by RT-PCR (RSV, Flu A&B, Covid) Anterior Nasal Swab         CBC with Differential/Platelet         Hemoglobin A1c         Ethanol         Lipid panel         TSH         POCT Urine Drug Screen - (I-Screen)         POC SARS Coronavirus 2 Ag-ED - Nasal Swab    EKG  Home medications  Aspirin 81 mg p.o. daily Plavix 75 mg p.o. daily Zetia 10 mg po daily  Cholecalciferol 5,000 u every other day Metformin 1,000 mg po BID  Metoprolol 100  mg po BID  NitroGLYCERIN 0.4 mg prn for chest pain  AUD Ativan 1 mg po taper  Recommendations  Based on my evaluation the patient does not appear to have an emergency medical condition.  Marissa Calamity, NP 08/07/22  4:03 PM

## 2022-08-07 NOTE — BH Assessment (Signed)
Comprehensive Clinical Assessment (CCA) Screening, Triage and Referral Note  08/07/2022 VOID [name] [id number]  Disposition: Per Darrol Angel, NP, patient is recommended for   [name] is a 62 year old male presenting to [facility] with chief complaint of alcohol problems and he is seeking detox. Patient reports he is drinking a pint of gin a day and beer occasionally. Patient reports he also uses cocaine when he is drinking. Patient reports his last drink was around 9am this morning and his last time snorting cocaine was three days ago. Patient reports he snorts about $20 worth of cocaine 1 to 2 times a month. Patient has been using cocaine and drinking alcohol since the age of  34.   Patient reports he was in recovery for 4 years about 11 years ago he relapsed 7 yrs ago. Patient was admitted to [facility] in November 2023 and was suppose to go to [facility] afterwards however patient states he did not make it to [facility] due to needing a refill for his medications. Patient also reports problems with severe anxiety which prevented him from going to [facility]. Patient reports he started drinking and using cocaine a month after leaving [facility]. Patient does not have outpatient services. Patient denies SI, HI, AVH.   The patient demonstrates the following risk factors for suicide: Chronic risk factors for suicide include: psychiatric disorder of depression and substance use disorder. Acute risk factors for suicide include: N/A. Protective factors for this patient include: responsibility to others (children, family) and hope for the future. Considering these factors, the overall suicide risk at this point appears to be low. Patient is appropriate for outpatient follow up.  Chief Complaint:  Chief Complaint  Patient presents with   Alcohol Problem   Visit Diagnosis: Alcohol Use Disorder  Patient Reported Information How did you hear about [place]? Self  What Is the Reason for Your Visit/Call Today? Pt seeking alcohol  detox  How Long Has This Been Causing You Problems? > than 6 months  What Do You Feel Would Help You the Most Today? Alcohol or Drug Use Treatment   Have You Recently Had Any Thoughts About Hurting Yourself? No  Are You Planning to Commit Suicide/Harm Yourself At This time? No   Have you Recently Had Thoughts About [place]? No  Are You Planning to Harm Someone at This Time? No  Explanation: No data recorded  Have You Used Any Alcohol or Drugs in the Past 24 Hours? Yes  How Long Ago Did You Use Drugs or Alcohol? No data recorded What Did You Use and How Much? pint of gin   Do You Currently Have a Therapist/Psychiatrist? No  Name of Therapist/Psychiatrist: NA   Have You Been Recently Discharged From Any Office Practice or Programs? No  Explanation of Discharge From Practice/Program: NA    CCA Screening Triage Referral Assessment Type of Contact: Face-to-Face  Telemedicine Service Delivery:   Is this Initial or Reassessment?   Date Telepsych consult ordered in [facility]:    Time Telepsych consult ordered in [facility]:    Location of Assessment: [facility] Assessment Services  Provider Location: [facility] Assessment Services    Collateral Involvement: NA   Does Patient Have a Stage manager Guardian? No data recorded Name and Contact of Legal Guardian: No data recorded If Minor and Not Living with Parent(s), Who has Custody? NA  Is CPS involved or ever been involved? Never  Is APS involved or ever been involved? Never   Patient Determined  To Be At Risk for Harm To Self or Others Based on Review of Patient Reported Information or Presenting Complaint? No  Method: No Plan  Availability of Means: No access or NA  Intent: Vague intent or NA  Notification Required: No need or identified person  Additional Information for Danger to Others Potential: -- (NA)  Additional Comments for Danger to Others Potential: NA  Are There Guns or Other Weapons in Your  Home? No  Types of Guns/Weapons: NA  Are These Weapons Safely Secured?                            -- (NA)  Who Could Verify You Are Able To Have These Secured: NA  Do You Have any Outstanding Charges, Pending Court Dates, Parole/Probation? NO  Contacted To Inform of Risk of Harm To Self or Others: -- (NA)   Does Patient Present under Involuntary Commitment? No    [place] of Residence: [place]   Patient Currently Receiving the Following Services: Not Receiving Services   Determination of Need: Routine (7 days)   Options For Referral: Medication Management; Outpatient Therapy; Facility-Based Crisis   Discharge Disposition:     [name], [facility]

## 2022-08-07 NOTE — ED Notes (Signed)
STAT lab courier called to transport labs to [facility] lab

## 2022-08-07 NOTE — ED Notes (Signed)
Pt admitted to obs waiting for bed on [facility]. Requesting detox. Denies SI/HI/AVH. Calm, cooperative throughout interview process. Skin assessment completed. Oriented to unit. Meal and drink offered. At [place], pt continue to denies SI/HI/AVH. Pt verbally contract for safety. Will monitor for safety. Socks ,shoe sting and pants were put in locker 9

## 2022-08-08 DIAGNOSIS — F1020 Alcohol dependence, uncomplicated: Secondary | ICD-10-CM | POA: Diagnosis not present

## 2022-08-08 DIAGNOSIS — F411 Generalized anxiety disorder: Secondary | ICD-10-CM

## 2022-08-08 DIAGNOSIS — F1410 Cocaine abuse, uncomplicated: Secondary | ICD-10-CM | POA: Diagnosis not present

## 2022-08-08 DIAGNOSIS — E1159 Type 2 diabetes mellitus with other circulatory complications: Secondary | ICD-10-CM | POA: Diagnosis not present

## 2022-08-08 LAB — GLUCOSE, CAPILLARY: Glucose-Capillary: 135 mg/dL — ABNORMAL HIGH (ref 70–99)

## 2022-08-08 NOTE — ED Notes (Signed)
Pt is in the bed sleeping. Respirations are even and unlabored. No acute distress noted. Will continue to monitor for safety. 

## 2022-08-08 NOTE — ED Notes (Signed)
Report given to [facility]

## 2022-08-08 NOTE — ED Notes (Signed)
Rn escorted patient to [facility].

## 2022-08-08 NOTE — ED Notes (Signed)
Pt resting in bed. A&O x4, calm and cooperative. Denies current SI/HI/AVH. No signs of distress noted. Monitoring for safety. 

## 2022-08-08 NOTE — ED Notes (Signed)
Patient admitted to [facility] from [facility] for detox from etoh and cocaine use.  Patient is known to unit as he was here in November.  Patient had planned to go to [facility] however he did not and relapsed.  Patient calm and cooperative with care.  He was reoriented to unit and shown to his room.  Patient pleasant and organized.  Denies avh shi or plan.  Skin unremarkable.  No evidence of withdrawal.  Will monitor.

## 2022-08-08 NOTE — ED Notes (Signed)
Patient alert and oriented x 3. Denies SI/HI/AVH. Denies intent or plan to harm self or others. Routine conducted according to faculty protocol. Encourage patient to notify staff with any needs or concerns. Patient verbalized agreement and understanding. Will continue to monitor for safety.

## 2022-08-08 NOTE — ED Provider Notes (Cosign Needed Addendum)
OBS ASAP Discharge Summary  Date and Time: 08/08/2022 9:25 AM  Name: [name]  MRN:  [id number]   Discharge Diagnoses:  Final diagnoses:  GAD (generalized anxiety disorder)  Uncomplicated alcohol dependence ([place])    Subjective: Patient seen and evaluated face to face by this provider, chart reviewed and case discussed with Dr. Dwyane Dee. On evaluation, patient is alert and oriented x 4. His thought process is logical and goal oriented. His speech is clear and coherent at a moderate tone. His mood is anxious and affect is congruent. He denies suicidal ideations. He denies homicidal ideations. He denies auditory or visual hallucinations. There is no objective evidence that the patient is currently responding to internal or external stimuli. He denies depressive symptoms. He reports good sleep. He reports a good appetite. He continues to verbalize feeling anxious early mornings. He rates his anxiety this morning as 6 out of 10 with 10 being the worst. He describes his anxiety symptoms currently as nervousness and palpitations. He denies alcohol withdrawal symptoms at this time. Vital signs are stable. No objective signs of withdrawal observed. He denies physical symptoms. He is medication compliant with no noticeable side effects.  Stay Summary: [name] is a 62 year old male patient with a past psychiatric history significant for MDD, GAD, cocaine use disorder, alcohol dependency, and suicidal ideations who presents to the [facility] behavioral health urgent care voluntary on 08/07/22 unaccompanied with a chief complaint of requesting alcohol detox and anxiety.   Patient was recommended for facility based crisis unit for alcohol detox and medication management for anxiety. Patient was admitted to the continuous assessment unit to await placement. Patient transferred to the [facility] on 08/08/2022.   Total Time spent with patient: 30 minutes  Past Psychiatric History: History of MDD, GAD, cocaine  use disorder, alcohol dependency, and suicidal ideations.   Past Medical History: per chart review, history of coronary disease, hypertension, dyslipidemia, diabetes type 2, and microvascular angina. He has had multiple PCIs, in-stent restenosis.  Family History: No history reported. Family Psychiatric History: Younger brother history of drug addition, oldest brother history of alcoholism and depression and oldest sister history of alcoholism and depression.  Social History: alcohol and cocaine use. Unemployed.  Tobacco Cessation:  N/A, patient does not currently use tobacco products  Current Medications:  Current Facility-Administered Medications  Medication Dose Route Frequency Provider Last Rate Last Admin   acetaminophen (TYLENOL) tablet 650 mg  650 mg Oral Q6H PRN Josefine Fuhr L, NP       alum & mag hydroxide-simeth (MAALOX/MYLANTA) 200-200-20 MG/5ML suspension 30 mL  30 mL Oral Q4H PRN Cleavon Goldman L, NP       aspirin EC tablet 81 mg  81 mg Oral Daily Gracey Tolle L, NP   81 mg at 08/08/22 0918   cholecalciferol (VITAMIN D3) 25 MCG (1000 UNIT) tablet 5,000 Units  5,000 Units Oral QODAY Terecia Plaut L, NP   5,000 Units at 08/08/22 0917   clopidogrel (PLAVIX) tablet 75 mg  75 mg Oral Daily Annel Zunker L, NP   75 mg at 08/08/22 [id number]   ezetimibe (ZETIA) tablet 10 mg  10 mg Oral Daily Darin Redmann L, NP   10 mg at 08/08/22 [id number]   hydrOXYzine (ATARAX) tablet 25 mg  25 mg Oral TID PRN Giann Obara L, NP   25 mg at 08/07/22 2148   loperamide (IMODIUM) capsule 2-4 mg  2-4 mg Oral PRN Marissa Calamity, NP  LORazepam (ATIVAN) tablet 1 mg  1 mg Oral Q6H PRN Leanda Padmore L, NP       LORazepam (ATIVAN) tablet 1 mg  1 mg Oral QID Lamond Glantz L, NP   1 mg at 08/08/22 [id number]   Followed by   Derrill Memo ON 08/09/2022] LORazepam (ATIVAN) tablet 1 mg  1 mg Oral TID Tracen Mahler L, NP       Followed by   Derrill Memo ON 08/10/2022] LORazepam (ATIVAN) tablet 1 mg  1 mg Oral BID Zykeriah Mathia L,  NP       Followed by   Derrill Memo ON 08/11/2022] LORazepam (ATIVAN) tablet 1 mg  1 mg Oral Daily Natanael Saladin L, NP       magnesium hydroxide (MILK OF MAGNESIA) suspension 30 mL  30 mL Oral Daily PRN Annissa Andreoni L, NP       metFORMIN (GLUCOPHAGE) tablet 1,000 mg  1,000 mg Oral BID WC Parris Signer L, NP   1,000 mg at 08/08/22 0753   metoprolol tartrate (LOPRESSOR) tablet 100 mg  100 mg Oral BID Niklas Chretien L, NP   100 mg at 08/08/22 [id number]   multivitamin with minerals tablet 1 tablet  1 tablet Oral Daily Shamyah Stantz L, NP   1 tablet at 08/08/22 0918   nitroGLYCERIN (NITROSTAT) SL tablet 0.4 mg  0.4 mg Sublingual Q5 Min x 3 PRN Konner Warrior L, NP       ondansetron (ZOFRAN-ODT) disintegrating tablet 4 mg  4 mg Oral Q6H PRN Kinsly Hild L, NP       thiamine (VITAMIN B1) tablet 100 mg  100 mg Oral Daily Vina Byrd L, NP   100 mg at 08/08/22 0918   traZODone (DESYREL) tablet 50 mg  50 mg Oral QHS PRN Ramsay Bognar L, NP   50 mg at 08/07/22 2148   Current Outpatient Medications  Medication Sig Dispense Refill   Cholecalciferol 25 MCG (1000 UT) tablet Take 5,000 Units by mouth every other day.     clopidogrel (PLAVIX) 75 MG tablet Take 75 mg by mouth daily.     metoprolol tartrate (LOPRESSOR) 100 MG tablet Take 1 tablet (100 mg total) by mouth 2 (two) times daily. 180 tablet 3   amLODipine (NORVASC) 10 MG tablet Take 1 tablet by mouth once daily 90 tablet 3   aspirin 81 MG EC tablet Take 1 tablet (81 mg total) by mouth daily. (May buy from over the counter): Swallow whole for heart health (Patient taking differently: Take 81 mg by mouth at bedtime. (May buy from over the counter): Swallow whole for heart health) 30 tablet 0   EPINEPHrine 0.3 mg/0.3 mL IJ SOAJ injection Inject 0.3 mg into the muscle as needed for anaphylaxis. 1 each 2   ezetimibe (ZETIA) 10 MG tablet Take 1 tablet (10 mg total) by mouth daily. 30 tablet 2   glucose blood (ACCU-CHEK GUIDE) test strip USE AS DIRECTED to  check blood sugars three times a day. 300 each 0   LIVALO 4 MG TABS Take 1 tablet by mouth once daily 90 tablet 3   metFORMIN (GLUCOPHAGE) 1000 MG tablet Take 1 tablet (1,000 mg total) by mouth 2 (two) times daily with a meal. 180 tablet 3   nitroGLYCERIN (NITROSTAT) 0.4 MG SL tablet Place 1 tablet (0.4 mg total) under the tongue every 5 (five) minutes x 3 doses as needed for chest pain. 25 tablet 3   Polyethyl Glycol-Propyl Glycol (SYSTANE) 0.4-0.3 % GEL ophthalmic gel Place 1 Application  into both eyes daily as needed (For dry eyes).     Testosterone (ANDROGEL PUMP) 20.25 MG/ACT (1.62%) GEL Place 1 Pump onto the skin daily. 75 g 2    PTA Medications:  Facility Ordered Medications  Medication   acetaminophen (TYLENOL) tablet 650 mg   alum & mag hydroxide-simeth (MAALOX/MYLANTA) 200-200-20 MG/5ML suspension 30 mL   magnesium hydroxide (MILK OF MAGNESIA) suspension 30 mL   hydrOXYzine (ATARAX) tablet 25 mg   traZODone (DESYREL) tablet 50 mg   [COMPLETED] thiamine (VITAMIN B1) injection 100 mg   thiamine (VITAMIN B1) tablet 100 mg   multivitamin with minerals tablet 1 tablet   LORazepam (ATIVAN) tablet 1 mg   loperamide (IMODIUM) capsule 2-4 mg   ondansetron (ZOFRAN-ODT) disintegrating tablet 4 mg   LORazepam (ATIVAN) tablet 1 mg   Followed by   Derrill Memo ON 08/09/2022] LORazepam (ATIVAN) tablet 1 mg   Followed by   Derrill Memo ON 08/10/2022] LORazepam (ATIVAN) tablet 1 mg   Followed by   Derrill Memo ON 08/11/2022] LORazepam (ATIVAN) tablet 1 mg   aspirin EC tablet 81 mg   cholecalciferol (VITAMIN D3) 25 MCG (1000 UNIT) tablet 5,000 Units   clopidogrel (PLAVIX) tablet 75 mg   ezetimibe (ZETIA) tablet 10 mg   metFORMIN (GLUCOPHAGE) tablet 1,000 mg   metoprolol tartrate (LOPRESSOR) tablet 100 mg   nitroGLYCERIN (NITROSTAT) SL tablet 0.4 mg   PTA Medications  Medication Sig   metoprolol tartrate (LOPRESSOR) 100 MG tablet Take 1 tablet (100 mg total) by mouth 2 (two) times daily.   EPINEPHrine 0.3  mg/0.3 mL IJ SOAJ injection Inject 0.3 mg into the muscle as needed for anaphylaxis.   aspirin 81 MG EC tablet Take 1 tablet (81 mg total) by mouth daily. (May buy from over the counter): Swallow whole for heart health (Patient taking differently: Take 81 mg by mouth at bedtime. (May buy from over the counter): Swallow whole for heart health)   nitroGLYCERIN (NITROSTAT) 0.4 MG SL tablet Place 1 tablet (0.4 mg total) under the tongue every 5 (five) minutes x 3 doses as needed for chest pain.   metFORMIN (GLUCOPHAGE) 1000 MG tablet Take 1 tablet (1,000 mg total) by mouth 2 (two) times daily with a meal.   Polyethyl Glycol-Propyl Glycol (SYSTANE) 0.4-0.3 % GEL ophthalmic gel Place 1 Application into both eyes daily as needed (For dry eyes).   amLODipine (NORVASC) 10 MG tablet Take 1 tablet by mouth once daily   LIVALO 4 MG TABS Take 1 tablet by mouth once daily   Testosterone (ANDROGEL PUMP) 20.25 MG/ACT (1.62%) GEL Place 1 Pump onto the skin daily.   glucose blood (ACCU-CHEK GUIDE) test strip USE AS DIRECTED to check blood sugars three times a day.       08/07/2022    3:01 PM 05/19/2022    1:59 PM 05/18/2022    1:58 PM  Depression screen PHQ 2/9  Decreased Interest 2 3 1  $ Down, Depressed, Hopeless 0 0 0  PHQ - 2 Score 2 3 1  $ Altered sleeping 2 0   Tired, decreased energy 2 3   Change in appetite 2 3   Feeling bad or failure about yourself  0 0   Trouble concentrating 2 3   Moving slowly or fidgety/restless 0 0   Suicidal thoughts 0 0   PHQ-9 Score 10 12   Difficult doing work/chores Somewhat difficult Very difficult     Flowsheet Row ED from 08/07/2022 in [facility] ED from 05/29/2022  in [facility] ED from 05/16/2022 in [place] No Risk No Risk Low Risk       Musculoskeletal  Strength & Muscle Tone: within normal limits Gait & Station: normal Patient leans:  N/A  Psychiatric Specialty Exam  Presentation  General Appearance:  Appropriate for Environment  Eye Contact: Fair  Speech: Clear and Coherent  Speech Volume: Normal  Handedness: Right   Mood and Affect  Mood: Anxious  Affect: Congruent   Thought Process  Thought Processes: Coherent; Goal Directed  Descriptions of Associations:Intact  Orientation:Full (Time, Place and Person)  Thought Content:Logical  Diagnosis of Schizophrenia or Schizoaffective disorder in past: No    Hallucinations:Hallucinations: None  Ideas of Reference:None  Suicidal Thoughts:Suicidal Thoughts: No  Homicidal Thoughts:Homicidal Thoughts: No   Sensorium  Memory: Immediate Fair; Recent Fair; Remote Fair  Judgment: Fair  Insight: Present; Fair   Community education officer  Concentration: Fair  Attention Span: Fair  Recall: [employer] of Knowledge: Fair  Language: Fair   Psychomotor Activity  Psychomotor Activity: Psychomotor Activity: Normal   Assets  Assets: Armed forces logistics/support/administrative officer; Desire for Improvement; Financial Resources/Insurance; Housing; Leisure Time; Physical Health   Sleep  Sleep: Sleep: Fair Number of Hours of Sleep: 8   Nutritional Assessment (For OBS and [facility] admissions only) Has the patient had a weight loss or gain of 10 pounds or more in the last 3 months?: No Has the patient had a decrease in food intake/or appetite?: No Does the patient have dental problems?: No Does the patient have eating habits or behaviors that may be indicators of an eating disorder including binging or inducing vomiting?: No Has the patient recently lost weight without trying?: 0 Has the patient been eating poorly because of a decreased appetite?: 0 Malnutrition Screening Tool Score: 0    Physical Exam  Physical Exam HENT:     Head: Normocephalic.     Nose: Nose normal.  Cardiovascular:     Rate and Rhythm: Normal rate.  Pulmonary:     Effort: Pulmonary  effort is normal.  Musculoskeletal:        General: Normal range of motion.     Cervical back: Normal range of motion.  Neurological:     Mental Status: He is alert and oriented to person, place, and time.    Review of Systems  Constitutional: Negative.   HENT: Negative.    Eyes: Negative.   Respiratory: Negative.    Cardiovascular: Negative.   Gastrointestinal: Negative.   Genitourinary: Negative.   Musculoskeletal: Negative.   Neurological: Negative.   Endo/Heme/Allergies: Negative.    Blood pressure 105/65, pulse 65, temperature 97.8 F (36.6 C), temperature source Tympanic, resp. rate 20, SpO2 99 %. There is no height or weight on file to calculate BMI.   Plan Of Care/Follow-up recommendations:  Activity:  as tolerated.   Home medications  Aspirin 81 mg p.o. daily Plavix 75 mg p.o. daily Zetia 10 mg po daily  Cholecalciferol 5,000 u every other day Metformin 1,000 mg po BID Metoprolol 100 mg po BID  NitroGLYCERIN 0.4 mg prn for chest pain   AUD Ativan 1 mg po taper  Anxiety  Vistaril 25 mg po TID prn Recommended antidepressant for long-term tx for GAD.   Disposition: Facility Based Crisis for alcohol detox.   Zuha Dejonge L, NP 08/08/2022, 9:25 AM

## 2022-08-08 NOTE — ED Notes (Signed)
Notified [place]  nurse that patient is waiting on bed when one is available.

## 2022-08-09 DIAGNOSIS — F1020 Alcohol dependence, uncomplicated: Secondary | ICD-10-CM | POA: Diagnosis not present

## 2022-08-09 DIAGNOSIS — F411 Generalized anxiety disorder: Secondary | ICD-10-CM | POA: Diagnosis not present

## 2022-08-09 DIAGNOSIS — F1410 Cocaine abuse, uncomplicated: Secondary | ICD-10-CM | POA: Diagnosis not present

## 2022-08-09 DIAGNOSIS — E1159 Type 2 diabetes mellitus with other circulatory complications: Secondary | ICD-10-CM | POA: Diagnosis not present

## 2022-08-09 MED ORDER — MIRTAZAPINE 7.5 MG PO TABS
7.5000 mg | ORAL_TABLET | Freq: Every day | ORAL | Status: DC
  Administered 2022-08-09: 7.5 mg via ORAL
  Filled 2022-08-09: qty 1

## 2022-08-09 NOTE — ED Notes (Signed)
Pt asleep in bed. Respirations even and unlabored. Monitoring for safety. 

## 2022-08-09 NOTE — ED Provider Notes (Addendum)
Facility Based Crisis Admission H&P  Date: 08/09/22 Patient Name: [name] MRN: [id number] Chief Complaint: requesting alcohol detox and help with anxiety   Diagnoses:  Final diagnoses:  GAD (generalized anxiety disorder)  Uncomplicated alcohol dependence ([place])    HPI:  Per [facility] H&P: "[name] is a 62 year old male patient with a past psychiatric history significant for MDD, GAD, cocaine use disorder, alcohol dependency, and suicidal ideations who presents to the [facility] behavioral health urgent care voluntary unaccompanied with a chief complaint of requesting alcohol detox and anxiety.   Patient seen and evaluated face-to-face by this provider, chart reviewed and case discussed with Dr. Dwyane Dee. On evaluation, patient is alert and oriented x 4. His thought process is logical and goal oriented. His speech is clear and coherent at a moderate tone. His mood is anxious and affect is congruent. He has fair eye contact. He appears well groomed and is casually dressed.  He is calm and cooperative. He does not appear to be in acute acute distress. He reports drinking alcohol everyday, on average a pint of gin daily. He states that he has been drinking all night and that his last drink was at 9 AM this morning. He denies alcohol withdrawal symptoms at this time. He reports drinking alcohol since age 25. He reports a recovery/sobriety of 4 years, 11 years ago. He reports actively drinking for the past 7 years. He reports using cocaine when he is drinking alcohol. He reports using cocaine 1 or 2 times per month, on average he spends about $20.00. He reports last using cocain 3 days ago. He reports using cocaine since age 62. He last received alcohol detox here at the [facility] facility based crisis in November 2023 and last received residential treatment at [facility] a year ago. He denies outpatient psychiatry. He denies outpatient counseling. He states that he is not taking prescribed  psychotropic medications. He denies feeling depressed. PHQ-9 score 10. He reports a long history of anxiety. He states that his anxiety is triggered in the early mornings and he fears of going outside. He describes his anxiety symptoms as feeling nervous, heart racing, feelings of apprehension, and difficulty going outside. He resides alone and is widowed. He is currently unemployed and on disability. He has 2 adult children that he states live across the country. He states that he has no support. He states that he had an [employer] sponsor but he died a year ago. He verbalizes readiness for treatment."   On interview today he reports that he is very anxious and depressed.  He reports that he he had maintained a long period of sobriety but that 7 years ago he fell back into addiction and has not been able to sustain sobriety since.  He reports that he would drink all day 1 day and then would rest for 2 to 3 days and just continuing to repeat this cycle.  He reports he wants to detox and then go to residential treatment.  He reports some dizziness but no other withdrawal symptoms at this time.  He reports no cravings.  He reports his depression and anxiety are significant.  He reports that in the past he was on Remeron and Wellbutrin.  He reports that he did very well on this combination years ago.  He reports that he was then tapered off of them and did well for years.  Discussed restarting Remeron at this time and he was agreeable to it.  He reports no SI,  HI, or AVH.  He reports his sleep is fair.  He reports his appetite is good.  He reports some low back pain but otherwise reports no other concerns at present.   PHQ 2-9:  Flowsheet Row ED from 08/07/2022 in [facility] ED from 05/16/2022 in [facility] Office Visit from 03/06/2022 in [place]  Thoughts that you would be better off dead, or of hurting yourself in some way Not at all Not  at all Not at all  PHQ-9 Total Score 10 12 0       [place] ED from 08/07/2022 in [facility] ED from 05/29/2022 in [facility] ED from 05/16/2022 in [place] No Risk No Risk Low Risk        Total Time spent with patient: 30 minutes  Musculoskeletal  Strength & Muscle Tone: within normal limits Gait & Station: normal Patient leans: N/A  Psychiatric Specialty Exam  Presentation General Appearance:  Appropriate for Environment; Casual  Eye Contact: Good  Speech: Clear and Coherent; Slow  Speech Volume: Normal  Handedness: Right   Mood and Affect  Mood: Dysphoric; Anxious  Affect: Congruent; Constricted; Depressed   Thought Process  Thought Processes: Coherent; Goal Directed  Descriptions of Associations:Intact  Orientation:Full (Time, Place and Person)  Thought Content:WDL; Logical  Diagnosis of Schizophrenia or Schizoaffective disorder in past: No   Hallucinations:Hallucinations: None  Ideas of Reference:None  Suicidal Thoughts:Suicidal Thoughts: No  Homicidal Thoughts:Homicidal Thoughts: No   Sensorium  Memory: Immediate Fair; Recent Fair  Judgment: Fair  Insight: Fair   Community education officer  Concentration: Good  Attention Span: Good  Recall: Good  Fund of Knowledge: Good  Language: Good   Psychomotor Activity  Psychomotor Activity: Psychomotor Activity: Normal   Assets  Assets: Communication Skills; Desire for Improvement; Financial Resources/Insurance; Housing; Resilience   Sleep  Sleep: Sleep: Fair   No data recorded  Physical Exam Vitals and nursing note reviewed.  Constitutional:      General: He is not in acute distress.    Appearance: Normal appearance. He is normal weight. He is not ill-appearing or toxic-appearing.  HENT:     Head: Normocephalic and atraumatic.  Pulmonary:     Effort:  Pulmonary effort is normal.  Musculoskeletal:        General: Normal range of motion.  Neurological:     General: No focal deficit present.     Mental Status: He is alert.    Review of Systems  Respiratory:  Negative for cough and shortness of breath.   Cardiovascular:  Negative for chest pain.  Gastrointestinal:  Negative for abdominal pain, constipation, diarrhea, nausea and vomiting.  Musculoskeletal:  Positive for back pain.  Neurological:  Positive for dizziness (mild). Negative for weakness and headaches.  Psychiatric/Behavioral:  Positive for depression. Negative for hallucinations and suicidal ideas. The patient is nervous/anxious.     Blood pressure (!) 137/92, pulse 66, temperature 98.3 F (36.8 C), temperature source Oral, resp. rate 18, SpO2 99 %. There is no height or weight on file to calculate BMI.  Past Psychiatric History: MDD, GAD, Cocaine Use Disorder, EtOH Abuse.    Is the patient at risk to self? No  Has the patient been a risk to self in the past 6 months? No .    Has the patient been a risk to self within the distant past? No  Is the patient a risk to others? No   Has the patient been a risk to others in the past 6 months? No   Has the patient been a risk to others within the distant past? No   Past Medical History: coronary disease, hypertension, dyslipidemia, diabetes type 2, and microvascular angina. He has had multiple PCIs, in-stent restenosis.  Family History: younger brother history of drug addition, oldest brother history of alcoholism and depression and oldest sister history of alcoholism and depression.   Social History: alcohol abuse and cocaine use. Unemployed on Disability.  Last Labs:  Admission on 08/07/2022  Component Date Value Ref Range Status   SARS Coronavirus 2 by RT PCR 08/07/2022 NEGATIVE  NEGATIVE Final   Influenza A by PCR 08/07/2022 NEGATIVE  NEGATIVE Final   Influenza B by PCR 08/07/2022 NEGATIVE  NEGATIVE Final   Comment:  (NOTE) The Xpert Xpress SARS-CoV-2/FLU/RSV plus assay is intended as an aid in the diagnosis of influenza from Nasopharyngeal swab specimens and should not be used as a sole basis for treatment. Nasal washings and aspirates are unacceptable for Xpert Xpress SARS-CoV-2/FLU/RSV testing.  Fact Sheet for Patients: EntrepreneurPulse.com.au  Fact Sheet for Healthcare Providers: IncredibleEmployment.be  This test is not yet approved or cleared by the [place] FDA and has been authorized for detection and/or diagnosis of SARS-CoV-2 by FDA under an Emergency Use Authorization (EUA). This EUA will remain in effect (meaning this test can be used) for the duration of the COVID-19 declaration under Section 564(b)(1) of the Act, 21 U.S.C. section 360bbb-3(b)(1), unless the authorization is terminated or revoked.     Resp Syncytial Virus by PCR 08/07/2022 NEGATIVE  NEGATIVE Final   Comment: (NOTE) Fact Sheet for Patients: EntrepreneurPulse.com.au  Fact Sheet for Healthcare Providers: IncredibleEmployment.be  This test is not yet approved or cleared by the [place] FDA and has been authorized for detection and/or diagnosis of SARS-CoV-2 by FDA under an Emergency Use Authorization (EUA). This EUA will remain in effect (meaning this test can be used) for the duration of the COVID-19 declaration under Section 564(b)(1) of the Act, 21 U.S.C. section 360bbb-3(b)(1), unless the authorization is terminated or revoked.  Performed at [facility], [place] [address], [place], [place] [address]    WBC 08/07/2022 6.5  4.0 - 10.5 K/uL Final   RBC 08/07/2022 4.99  4.22 - 5.81 MIL/uL Final   Hemoglobin 08/07/2022 14.3  13.0 - 17.0 g/dL Final   HCT 08/07/2022 42.8  39.0 - 52.0 % Final   MCV 08/07/2022 85.8  80.0 - 100.0 fL Final   MCH 08/07/2022 28.7  26.0 - 34.0 pg Final   MCHC 08/07/2022 33.4  30.0 - 36.0 g/dL Final    RDW 08/07/2022 12.2  11.5 - 15.5 % Final   Platelets 08/07/2022 219  150 - 400 K/uL Final   nRBC 08/07/2022 0.0  0.0 - 0.2 % Final   Neutrophils Relative % 08/07/2022 48  % Final   Neutro Abs 08/07/2022 3.2  1.7 - 7.7 K/uL Final   Lymphocytes Relative 08/07/2022 34  % Final   Lymphs Abs 08/07/2022 2.2  0.7 - 4.0 K/uL Final   Monocytes Relative 08/07/2022 7  % Final   Monocytes Absolute 08/07/2022 0.4  0.1 - 1.0 K/uL Final   Eosinophils Relative 08/07/2022 10  % Final   Eosinophils Absolute 08/07/2022 0.6 (H)  0.0 - 0.5 K/uL Final   Basophils Relative 08/07/2022 1  % Final   Basophils Absolute 08/07/2022 0.0  0.0 - 0.1 K/uL Final   Immature Granulocytes 08/07/2022 0  % Final   Abs Immature Granulocytes 08/07/2022 0.02  0.00 - 0.07 K/uL Final   Performed at [facility], [place] [address] [place], [place] [address]   Hgb A1c MFr Bld 08/07/2022 6.6 (H)  4.8 - 5.6 % Final   Comment: (NOTE) Pre diabetes:          5.7%-6.4%  Diabetes:              >6.4%  Glycemic control for   <7.0% adults with diabetes    Mean Plasma Glucose 08/07/2022 142.72  mg/dL Final   Performed at [facility], [place] [address], [place], [place] [address]   Alcohol, Ethyl (B) 08/07/2022 <10  <10 mg/dL Final   Comment: (NOTE) Lowest detectable limit for serum alcohol is 10 mg/dL.  For medical purposes only. Performed at [facility], [place] [address], [place], [place] [address]    Cholesterol 08/07/2022 150  0 - 200 mg/dL Final   Triglycerides 08/07/2022 288 (H)  <150 mg/dL Final   HDL 08/07/2022 47  >40 mg/dL Final   Total CHOL/HDL Ratio 08/07/2022 3.2  RATIO Final   VLDL 08/07/2022 58 (H)  0 - 40 mg/dL Final   LDL Cholesterol 08/07/2022 45  0 - 99 mg/dL Final   Comment:        Total Cholesterol/HDL:CHD Risk Coronary Heart Disease Risk Table                     Men   Women  1/2 Average Risk   3.4   3.3  Average Risk       5.0   4.4  2 X Average Risk   9.6   7.1  3 X Average Risk  23.4    11.0        Use the calculated Patient Ratio above and the CHD Risk Table to determine the patient's CHD Risk.        ATP III CLASSIFICATION (LDL):  <100     mg/dL   Optimal  100-129  mg/dL   Near or Above                    Optimal  130-159  mg/dL   Borderline  160-189  mg/dL   High  >190     mg/dL   Very High Performed at [facility] [address] [place], [place] [address]    TSH 08/07/2022 1.299  0.350 - 4.500 uIU/mL Final   Comment: Performed by a 3rd Generation assay with a functional sensitivity of <=0.01 uIU/mL. Performed at [facility], [place] [address] [place], [place] [address]    POC Amphetamine UR 08/07/2022 None Detected  NONE DETECTED (Cut Off Level 1000 ng/mL) Final   POC Secobarbital (BAR) 08/07/2022 None Detected  NONE DETECTED (Cut Off Level 300 ng/mL) Final   POC Buprenorphine (BUP) 08/07/2022 None Detected  NONE DETECTED (Cut Off Level 10 ng/mL) Final   POC Oxazepam (BZO) 08/07/2022 None Detected  NONE DETECTED (Cut Off Level 300 ng/mL) Final   POC Cocaine UR 08/07/2022 Positive (A)  NONE DETECTED (Cut Off Level 300 ng/mL) Final   POC Methamphetamine UR 08/07/2022 None Detected  NONE DETECTED (Cut Off Level 1000 ng/mL) Final   POC Morphine 08/07/2022 None Detected  NONE DETECTED (Cut Off Level 300 ng/mL) Final   POC Methadone UR 08/07/2022 None Detected  NONE DETECTED (Cut Off Level 300 ng/mL) Final   POC Oxycodone UR 08/07/2022 None Detected  NONE DETECTED (Cut Off Level 100 ng/mL) Final   POC Marijuana UR 08/07/2022 None Detected  NONE DETECTED (Cut Off Level 50 ng/mL) Final   SARSCOV2ONAVIRUS 2 AG 08/07/2022 NEGATIVE  NEGATIVE Final   Comment: (NOTE) SARS-CoV-2 antigen NOT DETECTED.   Negative results are presumptive.  Negative results do not preclude SARS-CoV-2 infection and should not be used as the sole basis for treatment or other patient management decisions, including infection  control decisions, particularly in the presence of  clinical signs and  symptoms consistent with COVID-19, or in those who have been in contact with the virus.  Negative results must be combined with clinical observations, patient history, and epidemiological information. The expected result is Negative.  Fact Sheet for Patients: HandmadeRecipes.com.cy  Fact Sheet for Healthcare Providers: FuneralLife.at  This test is not yet approved or cleared by the [place] FDA and  has been authorized for detection and/or diagnosis of SARS-CoV-2 by FDA under an Emergency Use Authorization (EUA).  This EUA will remain in effect (meaning this test can be used) for the duration of  the COV                          ID-19 declaration under Section 564(b)(1) of the Act, 21 U.S.C. section 360bbb-3(b)(1), unless the authorization is terminated or revoked sooner.     Glucose-Capillary 08/08/2022 135 (H)  70 - 99 mg/dL Final   Glucose reference range applies only to samples taken after fasting for at least 8 hours.  Admission on 05/16/2022, Discharged on 05/20/2022  Component Date Value Ref Range Status   SARS Coronavirus 2 by RT PCR 05/16/2022 NEGATIVE  NEGATIVE Final   Comment: (NOTE) SARS-CoV-2 target nucleic acids are NOT DETECTED.  The SARS-CoV-2 RNA is generally detectable in upper respiratory specimens during the acute phase of infection. The lowest concentration of SARS-CoV-2 viral copies this assay can detect is 138 copies/mL. A negative result does not preclude SARS-Cov-2 infection and should not be used as the sole basis for treatment or other patient management decisions. A negative result may occur with  improper specimen collection/handling, submission of specimen other than nasopharyngeal swab, presence of viral mutation(s) within the areas targeted by this assay, and inadequate number of viral copies(<138 copies/mL). A negative result must be combined with clinical observations,  patient history, and epidemiological information. The expected result is Negative.  Fact Sheet for Patients:  EntrepreneurPulse.com.au  Fact Sheet for Healthcare Providers:  IncredibleEmployment.be  This test is no                          t yet approved or cleared by the [place] FDA and  has been authorized for detection and/or diagnosis of SARS-CoV-2 by FDA under an Emergency Use Authorization (EUA). This EUA will remain  in effect (meaning this test can be used) for the duration of the COVID-19 declaration under Section 564(b)(1) of the Act, 21 U.S.C.section 360bbb-3(b)(1), unless the authorization is terminated  or revoked sooner.       Influenza A by PCR 05/16/2022 NEGATIVE  NEGATIVE Final   Influenza B by PCR 05/16/2022 NEGATIVE  NEGATIVE Final   Comment: (NOTE) The Xpert Xpress SARS-CoV-2/FLU/RSV plus assay is intended as an aid in the diagnosis of influenza from Nasopharyngeal swab specimens and should not be used as a sole basis  for treatment. Nasal washings and aspirates are unacceptable for Xpert Xpress SARS-CoV-2/FLU/RSV testing.  Fact Sheet for Patients: EntrepreneurPulse.com.au  Fact Sheet for Healthcare Providers: IncredibleEmployment.be  This test is not yet approved or cleared by the [place] FDA and has been authorized for detection and/or diagnosis of SARS-CoV-2 by FDA under an Emergency Use Authorization (EUA). This EUA will remain in effect (meaning this test can be used) for the duration of the COVID-19 declaration under Section 564(b)(1) of the Act, 21 U.S.C. section 360bbb-3(b)(1), unless the authorization is terminated or revoked.  Performed at [facility], [place] [address], [place], [place] [address]    WBC 05/16/2022 7.1  4.0 - 10.5 K/uL Final   RBC 05/16/2022 5.36  4.22 - 5.81 MIL/uL Final   Hemoglobin 05/16/2022 15.4  13.0 - 17.0 g/dL Final   HCT  05/16/2022 45.3  39.0 - 52.0 % Final   MCV 05/16/2022 84.5  80.0 - 100.0 fL Final   MCH 05/16/2022 28.7  26.0 - 34.0 pg Final   MCHC 05/16/2022 34.0  30.0 - 36.0 g/dL Final   RDW 05/16/2022 13.0  11.5 - 15.5 % Final   Platelets 05/16/2022 262  150 - 400 K/uL Final   nRBC 05/16/2022 0.0  0.0 - 0.2 % Final   Neutrophils Relative % 05/16/2022 51  % Final   Neutro Abs 05/16/2022 3.6  1.7 - 7.7 K/uL Final   Lymphocytes Relative 05/16/2022 33  % Final   Lymphs Abs 05/16/2022 2.3  0.7 - 4.0 K/uL Final   Monocytes Relative 05/16/2022 7  % Final   Monocytes Absolute 05/16/2022 0.5  0.1 - 1.0 K/uL Final   Eosinophils Relative 05/16/2022 8  % Final   Eosinophils Absolute 05/16/2022 0.6 (H)  0.0 - 0.5 K/uL Final   Basophils Relative 05/16/2022 1  % Final   Basophils Absolute 05/16/2022 0.1  0.0 - 0.1 K/uL Final   Immature Granulocytes 05/16/2022 0  % Final   Abs Immature Granulocytes 05/16/2022 0.02  0.00 - 0.07 K/uL Final   Performed at [facility], [place] [address], [place], [place] [address]   Sodium 05/16/2022 140  135 - 145 mmol/L Final   Potassium 05/16/2022 4.8  3.5 - 5.1 mmol/L Final   Chloride 05/16/2022 100  98 - 111 mmol/L Final   CO2 05/16/2022 20 (L)  22 - 32 mmol/L Final   Glucose, Bld 05/16/2022 144 (H)  70 - 99 mg/dL Final   Glucose reference range applies only to samples taken after fasting for at least 8 hours.   BUN 05/16/2022 16  6 - 20 mg/dL Final   Creatinine, Ser 05/16/2022 0.92  0.61 - 1.24 mg/dL Final   Calcium 05/16/2022 10.3  8.9 - 10.3 mg/dL Final   Total Protein 05/16/2022 6.8  6.5 - 8.1 g/dL Final   Albumin 05/16/2022 4.5  3.5 - 5.0 g/dL Final   AST 05/16/2022 30  15 - 41 U/L Final   ALT 05/16/2022 42  0 - 44 U/L Final   Alkaline Phosphatase 05/16/2022 61  38 - 126 U/L Final   Total Bilirubin 05/16/2022 0.5  0.3 - 1.2 mg/dL Final   GFR, Estimated 05/16/2022 >60  >60 mL/min Final   Comment: (NOTE) Calculated using the CKD-EPI Creatinine Equation (2021)     Anion gap 05/16/2022 20 (H)  5 - 15 Final   Performed at [facility] [address], [place], [place] [address]   Hgb A1c MFr Bld 05/16/2022 6.6 (  H)  4.8 - 5.6 % Final   Comment: (NOTE)         Prediabetes: 5.7 - 6.4         Diabetes: >6.4         Glycemic control for adults with diabetes: <7.0    Mean Plasma Glucose 05/16/2022 143  mg/dL Final   Comment: (NOTE) Performed At: [facility] [place], [place] [id number] Rush Farmer MD [id number]    Alcohol, Ethyl (B) 05/16/2022 36 (H)  <10 mg/dL Final   Comment: (NOTE) Lowest detectable limit for serum alcohol is 10 mg/dL.  For medical purposes only. Performed at [facility], [place] [address], [place], [place] [address]    Cholesterol 05/16/2022 126  0 - 200 mg/dL Final   Triglycerides 05/16/2022 219 (H)  <150 mg/dL Final   HDL 05/16/2022 45  >40 mg/dL Final   Total CHOL/HDL Ratio 05/16/2022 2.8  RATIO Final   VLDL 05/16/2022 44 (H)  0 - 40 mg/dL Final   LDL Cholesterol 05/16/2022 37  0 - 99 mg/dL Final   Comment:        Total Cholesterol/HDL:CHD Risk Coronary Heart Disease Risk Table                     Men   Women  1/2 Average Risk   3.4   3.3  Average Risk       5.0   4.4  2 X Average Risk   9.6   7.1  3 X Average Risk  23.4   11.0        Use the calculated Patient Ratio above and the CHD Risk Table to determine the patient's CHD Risk.        ATP III CLASSIFICATION (LDL):  <100     mg/dL   Optimal  100-129  mg/dL   Near or Above                    Optimal  130-159  mg/dL   Borderline  160-189  mg/dL   High  >190     mg/dL   Very High Performed at [facility] [address] [place], [place] [address]    TSH 05/16/2022 1.742  0.350 - 4.500 uIU/mL Final   Comment: Performed by a 3rd Generation assay with a functional sensitivity of <=0.01 uIU/mL. Performed at [facility], [place] [address], [place], [place] [address]    POC Amphetamine UR 05/16/2022 None Detected   NONE DETECTED (Cut Off Level 1000 ng/mL) Final   POC Secobarbital (BAR) 05/16/2022 None Detected  NONE DETECTED (Cut Off Level 300 ng/mL) Final   POC Buprenorphine (BUP) 05/16/2022 None Detected  NONE DETECTED (Cut Off Level 10 ng/mL) Final   POC Oxazepam (BZO) 05/16/2022 None Detected  NONE DETECTED (Cut Off Level 300 ng/mL) Final   POC Cocaine UR 05/16/2022 None Detected  NONE DETECTED (Cut Off Level 300 ng/mL) Final   POC Methamphetamine UR 05/16/2022 None Detected  NONE DETECTED (Cut Off Level 1000 ng/mL) Final   POC Morphine 05/16/2022 None Detected  NONE DETECTED (Cut Off Level 300 ng/mL) Final   POC Methadone UR 05/16/2022 None Detected  NONE DETECTED (Cut Off Level 300 ng/mL) Final   POC Oxycodone UR 05/16/2022 None Detected  NONE DETECTED (Cut Off Level 100 ng/mL) Final   POC Marijuana UR 05/16/2022 None Detected  NONE DETECTED (Cut Off Level 50 ng/mL) Final   SARSCOV2ONAVIRUS  2 AG 05/16/2022 NEGATIVE  NEGATIVE Final   Comment: (NOTE) SARS-CoV-2 antigen NOT DETECTED.   Negative results are presumptive.  Negative results do not preclude SARS-CoV-2 infection and should not be used as the sole basis for treatment or other patient management decisions, including infection  control decisions, particularly in the presence of clinical signs and  symptoms consistent with COVID-19, or in those who have been in contact with the virus.  Negative results must be combined with clinical observations, patient history, and epidemiological information. The expected result is Negative.  Fact Sheet for Patients: HandmadeRecipes.com.cy  Fact Sheet for Healthcare Providers: FuneralLife.at  This test is not yet approved or cleared by the [place] FDA and  has been authorized for detection and/or diagnosis of SARS-CoV-2 by FDA under an Emergency Use Authorization (EUA).  This EUA will remain in effect (meaning this test can be used) for the  duration of  the COV                          ID-19 declaration under Section 564(b)(1) of the Act, 21 U.S.C. section 360bbb-3(b)(1), unless the authorization is terminated or revoked sooner.     Glucose-Capillary 05/18/2022 134 (H)  70 - 99 mg/dL Final   Glucose reference range applies only to samples taken after fasting for at least 8 hours.  Office Visit on 03/06/2022  Component Date Value Ref Range Status   Testosterone 03/06/2022 261 (L)  264 - 916 ng/dL Final   Comment: Adult male reference interval is based on a population of healthy nonobese males (BMI <30) between 45 and 63 years old. [place], [place] [phone]. PMID: [id number].    Vit D, 25-Hydroxy 03/06/2022 40.1  30.0 - 100.0 ng/mL Final   Comment: Vitamin D deficiency has been defined by the [place] practice guideline as a level of serum 25-OH vitamin D less than 20 ng/mL (1,2). The Endocrine Society went on to further define vitamin D insufficiency as a level between 21 and 29 ng/mL (2). 1. IOM (Institute of Medicine). 2010. Dietary reference    intakes for calcium and D. [place]: The    [employer]. 2. Holick MF, Binkley [place], Bischoff-Ferrari HA, et al.    Evaluation, treatment, and prevention of vitamin D    deficiency: an Endocrine Society clinical practice    guideline. JCEM. 2011 Jul; 96(7):1911-30.    Vitamin B-12 03/06/2022 543  232 - 1,245 pg/mL Final    Allergies: Bee venom, Gadolinium, Shellfish allergy, Statins, and Testosterone cypionate  Medications:  Facility Ordered Medications  Medication   acetaminophen (TYLENOL) tablet 650 mg   alum & mag hydroxide-simeth (MAALOX/MYLANTA) 200-200-20 MG/5ML suspension 30 mL   magnesium hydroxide (MILK OF MAGNESIA) suspension 30 mL   hydrOXYzine (ATARAX) tablet 25 mg   traZODone (DESYREL) tablet 50 mg   [COMPLETED] thiamine (VITAMIN B1) injection 100 mg   thiamine (VITAMIN B1) tablet 100 mg    multivitamin with minerals tablet 1 tablet   LORazepam (ATIVAN) tablet 1 mg   loperamide (IMODIUM) capsule 2-4 mg   ondansetron (ZOFRAN-ODT) disintegrating tablet 4 mg   [COMPLETED] LORazepam (ATIVAN) tablet 1 mg   Followed by   LORazepam (ATIVAN) tablet 1 mg   Followed by   Derrill Memo ON 08/10/2022] LORazepam (ATIVAN) tablet 1 mg   Followed by   Derrill Memo ON 08/11/2022] LORazepam (ATIVAN) tablet 1 mg   aspirin EC tablet 81 mg   cholecalciferol (  VITAMIN D3) 25 MCG (1000 UNIT) tablet 5,000 Units   clopidogrel (PLAVIX) tablet 75 mg   ezetimibe (ZETIA) tablet 10 mg   metFORMIN (GLUCOPHAGE) tablet 1,000 mg   metoprolol tartrate (LOPRESSOR) tablet 100 mg   nitroGLYCERIN (NITROSTAT) SL tablet 0.4 mg   PTA Medications  Medication Sig   metoprolol tartrate (LOPRESSOR) 100 MG tablet Take 1 tablet (100 mg total) by mouth 2 (two) times daily.   EPINEPHrine 0.3 mg/0.3 mL IJ SOAJ injection Inject 0.3 mg into the muscle as needed for anaphylaxis.   aspirin 81 MG EC tablet Take 1 tablet (81 mg total) by mouth daily. (May buy from over the counter): Swallow whole for heart health (Patient taking differently: Take 81 mg by mouth at bedtime. (May buy from over the counter): Swallow whole for heart health)   nitroGLYCERIN (NITROSTAT) 0.4 MG SL tablet Place 1 tablet (0.4 mg total) under the tongue every 5 (five) minutes x 3 doses as needed for chest pain.   metFORMIN (GLUCOPHAGE) 1000 MG tablet Take 1 tablet (1,000 mg total) by mouth 2 (two) times daily with a meal.   Polyethyl Glycol-Propyl Glycol (SYSTANE) 0.4-0.3 % GEL ophthalmic gel Place 1 Application into both eyes daily as needed (For dry eyes).   amLODipine (NORVASC) 10 MG tablet Take 1 tablet by mouth once daily   LIVALO 4 MG TABS Take 1 tablet by mouth once daily   Testosterone (ANDROGEL PUMP) 20.25 MG/ACT (1.62%) GEL Place 1 Pump onto the skin daily.   glucose blood (ACCU-CHEK GUIDE) test strip USE AS DIRECTED to check blood sugars three times a day.     Long Term Goals: Improvement in symptoms so as ready for discharge  Short Term Goals: Patient will verbalize feelings in meetings with treatment team members., Patient will attend at least of 50% of the groups daily., Pt will complete the PHQ9 on admission, day 3 and discharge., Patient will participate in completing the [place], Patient will score a low risk of violence for 24 hours prior to discharge, and Patient will take medications as prescribed daily.  Medical Decision Making   Loura Pardon. Kibby is a 62 yr old male who presented to [facility] on 2/9 requesting detox from EtOH and help for anxiety, he was admitted to [facility] on 2/10.  PPHx is significant for MDD, GAD, Cocaine Use Disorder, EtOH Abuse.   Jmichael has tolerated detox well so far without significant withdrawal symptoms.  We will continue with the Ativan taper.  We will start Remeron for his depression and anxiety.  We will not make any other medication changes at this time.  He is interested in Residential treatment and appreciate Social Work assistance with this.  We will continue to monitor.    Withdrawal: -Continue CIWA, last score= 0   @ 0617  2/11  -Continue Ativan taper to end on 2/13 -Continue Ativan 1 mg q6 PRN CIWA>10 -Continue Imodium 2-4 mg PRN diarrhea -Continue Zofran-ODT 4 mg q6 PRN nausea -Continue Thiamine 100 mg daily for nutritional supplementation -Continue Multivitamin daily for nutritional supplementation   Depression  GAD: -Start Remeron 7.5 mg QHS   Home medications  -Continue Aspirin 81 mg daily -Continue Plavix 75 mg daily -Continue Zetia 10 mg daily  -Continue Cholecalciferol 5,000 u every other day -Continue Metformin 1,000 mg BID -Continue Metoprolol 100 mg BID  -Continue NitroGLYCERIN 0.4 mg PRN chest pain   -Continue PRN's: Tylenol, Maalox, Atarax, Milk of Magnesia, Trazodone   Dispo: Interested in  Residential Treatment   Recommendations  Based on my  evaluation the patient does not appear to have an emergency medical condition.  Briant Cedar, MD 08/09/22  12:22 PM

## 2022-08-09 NOTE — ED Notes (Signed)
Patient was awake for breakfast.  He has now returned to room and is resting in bed.  Patient reports ongoing anxiety and states he was diagnosed with GAD.  Patient is calm and pleasant and appears motivated for treatment.  Patient has a genuine desire for recovery however thought process remains concrete.  Encouraged patient to seek out staff to have needs met.  No withdrawal noted.  Will monitor.

## 2022-08-09 NOTE — ED Notes (Signed)
Patient observed/assessed in room in bed appearing in no immediate distress resting peacefully. Q15 minute checks continued by MHT and nursing staff. Will continue to monitor and support. 

## 2022-08-10 DIAGNOSIS — E1159 Type 2 diabetes mellitus with other circulatory complications: Secondary | ICD-10-CM | POA: Diagnosis not present

## 2022-08-10 DIAGNOSIS — F1410 Cocaine abuse, uncomplicated: Secondary | ICD-10-CM | POA: Diagnosis not present

## 2022-08-10 DIAGNOSIS — F1020 Alcohol dependence, uncomplicated: Secondary | ICD-10-CM | POA: Diagnosis not present

## 2022-08-10 DIAGNOSIS — F411 Generalized anxiety disorder: Secondary | ICD-10-CM | POA: Diagnosis not present

## 2022-08-10 LAB — GLUCOSE, CAPILLARY: Glucose-Capillary: 154 mg/dL — ABNORMAL HIGH (ref 70–99)

## 2022-08-10 MED ORDER — MIRTAZAPINE 15 MG PO TABS
15.0000 mg | ORAL_TABLET | Freq: Every day | ORAL | Status: DC
  Administered 2022-08-10 – 2022-08-11 (×2): 15 mg via ORAL
  Filled 2022-08-10 (×2): qty 1

## 2022-08-10 NOTE — ED Notes (Signed)
Patient observed/assessed in room in bed appearing in no immediate distress resting peacefully. Q15 minute checks continued by MHT and nursing staff. Will continue to monitor and support. 

## 2022-08-10 NOTE — ED Notes (Signed)
Patient resting quietly in bed with eyes closed. Respirations equal and unlabored, skin warm and dry, NAD. No change in assessment or acuity. Q 15 minute safety checks remain in place.

## 2022-08-10 NOTE — ED Notes (Signed)
Patient A&Ox4. Patient denies SI/HI and AVH. Patient complained of lower back pain. Patient medicated per MAR orders.No acute distress noted. Support and encouragement provided. Q 15 min safety checks remain in place. Encouraged patient to notify staff if thoughts of harm toward self or others arise. Patient verbalize understanding and agreement. Will continue to monitor for safety.

## 2022-08-10 NOTE — ED Notes (Signed)
Pt resting in bed. A&O x4, calm and cooperative. Denies current SI/HI/AVH. No signs of distress noted. Monitoring for safety. 

## 2022-08-10 NOTE — ED Notes (Signed)
Pt asleep in bed. Respirations even and unlabored. Monitoring for safety. 

## 2022-08-10 NOTE — Tx Team (Signed)
LCSW met with patient to assess current mood, affect, physical state, and inquire about needs/goals while here in [facility] and after discharge. Patient reports he presented due to an active addition to alcohol. Patient reports has been living alone and reports having adult children that are dispersed throughout the [place] that he rarely speaks with. Patient reports little to no social support at this time and denies having access to transportation of his own. Patient reports he typically takes the bus to get around. Patient reports his current goal is to seek residential placement for his current alcohol use in order to help himself. Patient expressed an interest in going to [facility] once stable for discharge. LCSW explored if patient would be interested in [facility], and patient is agreeable. Patient aware that LCSW will send referrals out for review and will follow up to provide updates as received. Patient currently denies any SI/HI/AVH and reports mood as fine. No other needs were reported at this time by patient.    Referral has been sent to [facility] for review.  LCSW will continue to follow and provide support to patient while on [facility] unit.   Lucius Conn, LCSW Clinical Social Worker [facility] Ph: [phone]

## 2022-08-10 NOTE — ED Provider Notes (Signed)
[facility] Progress Note  Date and Time: 08/10/2022 10:58 AM Name: [name] MRN:  [id number]  Reason For Admission: [name] is a 62 yr old male who presented to [facility] on 2/9 requesting detox from EtOH and help for anxiety, he was admitted to [facility] on 2/10.  PPHx is significant for MDD, GAD, Cocaine Use Disorder, EtOH Abuse.   Subjective:  Patient reports that his alcohol use was precipitated by anxiety. He reports his anxiety feels like palpitations, restlessness, difficulty concentration first thing upon awakening in the AM around a month after his discharge from the [facility] in November. He reports low energy, difficulty concentrating and a "terrible diet." He reports he hasn't been to the gym in a while. He denies depressed mood or changes in his sleep. He reports that this anxiety contributed to him using alcohol. He reports drinking at least a pint of gin daily since December. He denies any seizures from alcohol withdrawal in the past. He reports a history of sobriety for 4 years 11 years ago. He also reports occasional cocaine use, once in the past week with his neighbor but he denies that he uses cocaine daily. He reports past psychiatric history of MDD, GAD. Reports he was prescribed hydroxyzine PRN in the past for his GAD but it was not that helpful. He also reports previously seeing a therapist until beginning of 2023. He reports he was prescribed mirtazapine and wellbutrin in the past for MDD but denies any other Rx for anxiety besides the hydroxyzine. Discussed that we could increase his mirtazapine and he was amenable. He denies any side effects from starting it yesterday. Also discussed starting naltrexone for his alcohol use disorder and he was amenable, discussed that we could start this tomorrow. He denies SI/HI/AVH.   Diagnosis:  Final diagnoses:  GAD (generalized anxiety disorder)  Uncomplicated alcohol dependence ([place])    Total Time spent with patient: 1.5 hours   Labs  Labs  reviewed and notable for: UDS+cocaine, alcohol<10, A1c 6.6, TG 288 VLDL 58  Lab Results:     Latest Ref Rng & Units 08/07/2022    4:19 PM 05/16/2022    1:33 PM 12/04/2021    8:50 AM  CBC  WBC 4.0 - 10.5 K/uL 6.5  7.1  5.0   Hemoglobin 13.0 - 17.0 g/dL 14.3  15.4  14.3   Hematocrit 39.0 - 52.0 % 42.8  45.3  43.0   Platelets 150 - 400 K/uL 219  262  240       Latest Ref Rng & Units 05/16/2022    1:33 PM 01/20/2022    2:20 PM 12/04/2021    8:50 AM  CMP  Glucose 70 - 99 mg/dL 144  102  157   BUN 6 - 20 mg/dL 16  15  14   $ Creatinine 0.61 - 1.24 mg/dL 0.92  0.93  1.03   Sodium 135 - 145 mmol/L 140  139  140   Potassium 3.5 - 5.1 mmol/L 4.8  4.2  4.5   Chloride 98 - 111 mmol/L 100  107  105   CO2 22 - 32 mmol/L 20  23  22   $ Calcium 8.9 - 10.3 mg/dL 10.3  9.3  9.4   Total Protein 6.5 - 8.1 g/dL 6.8  6.2  7.0   Total Bilirubin 0.3 - 1.2 mg/dL 0.5  0.2  0.3   Alkaline Phos 38 - 126 U/L 61  69  74   AST 15 - 41 U/L 30  22  22   ALT 0 - 44 U/L 42  35  53     Physical Findings   AIMS    Flowsheet Row Admission (Discharged) from OP Visit from 09/07/2019 in [place] [facility] Admission (Discharged) from OP Visit from 01/12/2019 in [place] [facility] Admission (Discharged) from OP Visit from 08/15/2018 in [place] [facility] Admission (Discharged) from OP Visit from 10/02/2017 in [place] [facility] Admission (Discharged) from 06/22/2017 in [place] [facility]  AIMS Total Score 0 0 0 0 0      AUDIT    Flowsheet Row Office Visit from 08/27/2020 in [facility] Admission (Discharged) from OP Visit from 03/18/2020 in [place] [facility] Admission (Discharged) from 12/10/2019 in [place] [facility] Admission (Discharged) from OP Visit from 09/07/2019 in [place] [facility]  Admission (Discharged) from OP Visit from 01/12/2019 in [place] [facility]  Alcohol Use Disorder Identification Test Final Score (AUDIT) 27 28 13 25 $ 0      GAD-7    Flowsheet Row Clinical Support from 02/24/2022 in [facility] Office Visit from 01/08/2022 in [place] Video Visit from 11/19/2021 in [place] from 07/07/2021 in [facility] Office Visit from 08/27/2020 in [facility]  Total GAD-7 Score 2 12 13 19 7      $ PHQ2-9    [place] ED from 08/07/2022 in [facility] ED from 05/16/2022 in [facility] Office Visit from 03/06/2022 in [place] from 02/24/2022 in [name] Office Visit from 01/08/2022 in [place]  PHQ-2 Total Score 2 3 0 0 4  PHQ-9 Total Score 10 12 0 2 14      Flowsheet Row ED from 08/07/2022 in [facility] ED from 05/29/2022 in [facility] ED from 05/16/2022 in [place] No Risk No Risk Low Risk        Musculoskeletal  Strength & Muscle Tone: within normal limits Gait & Station: normal Patient leans: N/A  Psychiatric Specialty Exam  Appearance and Grooming: Patient is casually dressed in scrubs . The patient has no noticeable scent or odor. He has glasses on.  Motor activity: The patient's movement speed was normal; his gait was normal. There was no notable abnormal facial movements and no notable abnormal extremity movements. Behavior: The patient appears in no acute distress, and during the interview, was calm, focused, required minimal redirection, and behaving appropriately to scenario; he was able to follow commands and compliant to  requests and made good eye contact. The patient did not appear internally or externally preoccupied. Attitude: Patient's attitude towards the interviewer was cooperative and open. Speech: The patient's speech was clear, fluent, with good articulation, and with appropriately placed inflections. The volume of his speech was normal and normal in quantity. The rate was normal with a normal rhythm. Responses were normal in latency. There were no abnormal patterns in speech. Mood: "Ready to get treatment" Affect: Patient's affect is euthymic with broad range and even fluctuations; his affect is appropriate for the topic of conversation with his stated mood. ------------------------------------------------------------------------------------------------------------------------- Thought Content The patient  experiences no hallucinations. The patient describes no delusional thoughts; he denies thought insertion, denies thought withdrawal, denies thought interruption, and denies thought broadcasting. Patient at the time of interview denies active suicidal intent and denies passive suicidal ideation; he denies homicidal intent. Thought Process The patient's thought process is linear and is goal-directed. Insight The patient at the time of interview demonstrates fair insight, as evidenced by acknowledgement of substance use disorder/s. Judgement The patient over the past 24 hours demonstrates fair judgement, as evidenced by seeking placement for rehab.  Memory: intact  Executive Functions  Concentration: intact Attention Span: fair Recall: intact Fund of Knowledge: fair  Alertness/Orientation: alert and answering questions appropriately  Assets  Assets: Armed forces logistics/support/administrative officer; Desire for Improvement; Financial Resources/Insurance; Housing; Resilience  Sleep  Sleep: Sleep: Fair   Physical Exam  Constitutional:      Appearance: the patient is not toxic-appearing.  Pulmonary:     Effort:  Pulmonary effort is normal.  Neurological:     General: No focal deficit present.     Mental Status: the patient is alert and oriented, answering questions appropriately.   Review of Systems  Respiratory:  Negative for shortness of breath.   Cardiovascular:  Negative for chest pain.  Gastrointestinal:  Negative for abdominal pain, constipation, diarrhea, nausea and vomiting.  Neurological:  Negative for headaches.   Blood pressure (!) 129/94, pulse 88, temperature 98.3 F (36.8 C), temperature source Oral, resp. rate 18, SpO2 99 %. There is no height or weight on file to calculate BMI.  ASSESSMENT Ramona L. Verdugo is a 62 yr old male who presented to [facility] on 2/9 requesting detox from EtOH and help for anxiety, he was admitted to [facility] on 2/10.  PPHx is significant for MDD, GAD, Cocaine Use Disorder, EtOH Abuse. His anxiety seems to precipitate his alcohol use and contributed to his most recent EtOH use. He was started on remeron for anxiety and appears to be tolerating this well. We will increase remeron to 74m tonight for anxiety. He is also open to starting naltrexone tomorrow. He is interested in inpatient rehab.   PLAN Alcohol Use Disorder  -Continue CIWA, last score= 0  @2$ /12 0501 -Continue Ativan taper to end on 2/13, ativan BID today -Continue Ativan 1 mg q6 PRN CIWA>10 -Continue Imodium 2-4 mg PRN diarrhea -Continue Zofran-ODT 4 mg q6 PRN nausea -Continue Thiamine 100 mg daily for nutritional supplementation -Continue Multivitamin daily for nutritional supplementation   GAD: -Increase Remeron 15 mg QHS   Home medications  -Continue Aspirin 81 mg daily -Continue Plavix 75 mg daily -Continue Zetia 10 mg daily  -Continue Cholecalciferol 5,000 u every other day -Continue Metformin 1,000 mg BID -Continue Metoprolol 100 mg BID  -Continue NitroGLYCERIN 0.4 mg PRN chest pain   -Continue PRN's: Tylenol, Maalox, Atarax, Milk of Magnesia, Trazodone   Dispo: Interested in  Residential Treatment, [facility]   SRolanda Lundborg MD, PGY-1  08/10/2022 10:58 AM

## 2022-08-11 DIAGNOSIS — F1410 Cocaine abuse, uncomplicated: Secondary | ICD-10-CM | POA: Diagnosis not present

## 2022-08-11 DIAGNOSIS — E1159 Type 2 diabetes mellitus with other circulatory complications: Secondary | ICD-10-CM | POA: Diagnosis not present

## 2022-08-11 DIAGNOSIS — F411 Generalized anxiety disorder: Secondary | ICD-10-CM | POA: Diagnosis not present

## 2022-08-11 DIAGNOSIS — F1020 Alcohol dependence, uncomplicated: Secondary | ICD-10-CM | POA: Diagnosis not present

## 2022-08-11 LAB — COMPREHENSIVE METABOLIC PANEL
ALT: 34 U/L (ref 0–44)
AST: 21 U/L (ref 15–41)
Albumin: 3.4 g/dL — ABNORMAL LOW (ref 3.5–5.0)
Alkaline Phosphatase: 50 U/L (ref 38–126)
Anion gap: 8 (ref 5–15)
BUN: 10 mg/dL (ref 6–20)
CO2: 27 mmol/L (ref 22–32)
Calcium: 9.1 mg/dL (ref 8.9–10.3)
Chloride: 105 mmol/L (ref 98–111)
Creatinine, Ser: 0.85 mg/dL (ref 0.61–1.24)
GFR, Estimated: >60 mL/min (ref >60)
Glucose, Bld: 128 mg/dL — ABNORMAL HIGH (ref 70–99)
Potassium: 3.9 mmol/L (ref 3.5–5.1)
Sodium: 140 mmol/L (ref 135–145)
Total Bilirubin: 0.1 mg/dL — ABNORMAL LOW (ref 0.3–1.2)
Total Protein: 5.7 g/dL — ABNORMAL LOW (ref 6.5–8.1)

## 2022-08-11 LAB — GLUCOSE, CAPILLARY: Glucose-Capillary: 135 mg/dL — ABNORMAL HIGH (ref 70–99)

## 2022-08-11 MED ORDER — NALTREXONE HCL 50 MG PO TABS
50.0000 mg | ORAL_TABLET | Freq: Every day | ORAL | Status: DC
  Administered 2022-08-11 – 2022-08-12 (×2): 50 mg via ORAL
  Filled 2022-08-11 (×2): qty 1

## 2022-08-11 NOTE — ED Notes (Signed)
Patient observed/assessed in room in bed appearing in no immediate distress resting peacefully. Q15 minute checks continued by MHT and nursing staff. Will continue to monitor and support. 

## 2022-08-11 NOTE — ED Notes (Signed)
Patient resting in dayroom. Respirations equal and unlabored, skin warm and dry, NAD. No change in assessment or acuity. Q 15 minute safety checks remain in place.

## 2022-08-11 NOTE — ED Notes (Signed)
Patient in dayroom eating dinner. Respirations equal and unlabored, skin warm and dry, NAD. No change in assessment or acuity. Q 15 minute safety checks remain in place.

## 2022-08-11 NOTE — ED Notes (Signed)
Patient A&Ox4. Patient denies SI/HI and AVH. Patient denies any physical complaints when asked. No acute distress noted. Support and encouragement provided. Routine safety checks conducted according to facility protocol. Encouraged patient to notify staff if thoughts of harm toward self or others arise. Patient verbalize understanding and agreement. Will continue to monitor for safety.

## 2022-08-11 NOTE — ED Provider Notes (Signed)
FBC Progress Note  Date and Time: 08/11/2022 10:49 AM Name: [name] MRN:  [id number]  Reason For Admission: [name] is a 62 yr old male who presented to [facility] on 2/9 requesting detox from EtOH and help for anxiety, he was admitted to [facility] on 2/10.  PPHx is significant for MDD, GAD, Cocaine Use Disorder, EtOH Abuse.    Subjective: Patient reports improvement in mood and anxiety this morning. He denies any withdrawal symptoms at this time. He reports he slept all the way through. His appetite is fair. He says he has trouble finding food that is consistent with his diabetic diet. He reports some lower back pain. Reports he is working on exercise. He reports no SI, HI, or AVH. Mirtazapine was increased to 15 mg yesterday and he denies any side effects. He denies feeling excessively sedated. Also discussed starting Naltrexone today for his alcohol use disorder and he was in agreement. Discussed his LFTs were wnl. He endorses some low back pain but otherwise has no concerns at present.   Diagnosis:  Final diagnoses:  GAD (generalized anxiety disorder)  Uncomplicated alcohol dependence ([place])    Total Time spent with patient: 30 minutes   Labs  Labs reviewed and notable for: AST 21, ALT 34  Lab Results:     Latest Ref Rng & Units 08/07/2022    4:19 PM 05/16/2022    1:33 PM 12/04/2021    8:50 AM  CBC  WBC 4.0 - 10.5 K/uL 6.5  7.1  5.0   Hemoglobin 13.0 - 17.0 g/dL 14.3  15.4  14.3   Hematocrit 39.0 - 52.0 % 42.8  45.3  43.0   Platelets 150 - 400 K/uL 219  262  240       Latest Ref Rng & Units 08/11/2022    6:00 AM 05/16/2022    1:33 PM 01/20/2022    2:20 PM  CMP  Glucose 70 - 99 mg/dL 128  144  102   BUN 6 - 20 mg/dL 10  16  15   $ Creatinine 0.61 - 1.24 mg/dL 0.85  0.92  0.93   Sodium 135 - 145 mmol/L 140  140  139   Potassium 3.5 - 5.1 mmol/L 3.9  4.8  4.2   Chloride 98 - 111 mmol/L 105  100  107   CO2 22 - 32 mmol/L 27  20  23   $ Calcium 8.9 - 10.3 mg/dL 9.1  10.3  9.3    Total Protein 6.5 - 8.1 g/dL 5.7  6.8  6.2   Total Bilirubin 0.3 - 1.2 mg/dL 0.1  0.5  0.2   Alkaline Phos 38 - 126 U/L 50  61  69   AST 15 - 41 U/L 21  30  22   $ ALT 0 - 44 U/L 34  42  35     Physical Findings   AIMS    Flowsheet Row Admission (Discharged) from OP Visit from 09/07/2019 in [place] [facility] Admission (Discharged) from OP Visit from 01/12/2019 in [place] [facility] Admission (Discharged) from OP Visit from 08/15/2018 in [place] [facility] Admission (Discharged) from OP Visit from 10/02/2017 in [place] [facility] Admission (Discharged) from 06/22/2017 in [place] [facility]  AIMS Total Score 0 0 0 0 0      AUDIT    Flowsheet Row Office Visit from 08/27/2020 in [facility] Admission (  Discharged) from OP Visit from 03/18/2020 in [place] [facility] Admission (Discharged) from 12/10/2019 in [place] [facility] Admission (Discharged) from OP Visit from 09/07/2019 in [place] [facility] Admission (Discharged) from OP Visit from 01/12/2019 in [place] [facility]  Alcohol Use Disorder Identification Test Final Score (AUDIT) 27 28 13 25 $ 0      GAD-7    Flowsheet Row Clinical Support from 02/24/2022 in [facility] Office Visit from 01/08/2022 in [place] Video Visit from 11/19/2021 in [place] from 07/07/2021 in [facility] Office Visit from 08/27/2020 in [facility]  Total GAD-7 Score 2 12 13 19 7      $ PHQ2-9    [place] ED from 08/07/2022 in [facility] ED from 05/16/2022 in [facility] Office Visit from 03/06/2022  in [place] from 02/24/2022 in [facility] Office Visit from 01/08/2022 in [place]  PHQ-2 Total Score 2 3 0 0 4  PHQ-9 Total Score 13 12 0 2 14      Flowsheet Row ED from 08/07/2022 in [facility] ED from 05/29/2022 in [facility] Care at [facility] ED from 05/16/2022 in [place] No Risk No Risk Low Risk        Musculoskeletal  Strength & Muscle Tone: within normal limits Gait & Station: normal Patient leans: N/A  Psychiatric Specialty Exam  Appearance and Grooming: Patient is casually dressed in scrubs . The patient has no noticeable scent or odor. He is wearing glasses.  Motor activity: The patient's movement speed was normal; his gait was normal. There was no notable abnormal facial movements and no notable abnormal extremity movements. Behavior: The patient appears in no acute distress, and during the interview, was calm, focused, required minimal redirection, and behaving appropriately to scenario; he was able to follow commands and compliant to requests and made good eye contact. The patient did not appear internally or externally preoccupied. Attitude: Patient's attitude towards the interviewer was cooperative and open. Speech: The patient's speech was clear, fluent, with good articulation, and with appropriately placed inflections. The volume of his speech was normal and normal in quantity. The rate was normal with a normal rhythm. Responses were normal in latency. There were no abnormal patterns in speech. Mood: "Feeling better" Affect: Patient's affect is euthymic with broad range and even fluctuations; his affect is appropriate for the topic of conversation with his stated  mood. ------------------------------------------------------------------------------------------------------------------------- Thought Content The patient experiences no hallucinations. The patient describes no delusional thoughts; he denies thought insertion, denies thought withdrawal, denies thought interruption, and denies thought broadcasting. Patient at the time of interview denies active suicidal intent and denies passive suicidal ideation; he denies homicidal intent. Thought Process The patient's thought process is linear and is goal-directed. Insight The patient at the time of interview demonstrates fair insight, as evidenced by acknowledgement of substance use disorder/s. Judgement The patient over the past 24 hours demonstrates fair judgement, as evidenced by seeking placement for rehab.  Memory: intact  Executive Functions  Concentration: intact Attention Span: fair Recall: intact Fund of Knowledge: fair   Alertness/Orientation: alert and answering questions appropriately.   Assets  Assets: Armed forces logistics/support/administrative officer; Desire for Improvement; Financial Resources/Insurance; Housing;  Resilience  Sleep  Sleep: Improved    Physical Exam  Physical Exam Vitals reviewed.  Constitutional:      Appearance: the patient is not toxic-appearing.  Pulmonary:     Effort: Pulmonary effort is normal.  Neurological:     General: No focal deficit present.     Mental Status: the patient is alert and answering questions appropriately.   Review of Systems  Respiratory:  Negative for shortness of breath.   Cardiovascular:  Negative for chest pain.  Gastrointestinal:  Negative for abdominal pain, constipation, diarrhea, nausea and vomiting.  Neurological:  Negative for headaches.   Blood pressure (!) 144/95, pulse 60, temperature 98.3 F (36.8 C), temperature source Tympanic, resp. rate 16, SpO2 100 %. There is no height or weight on file to calculate BMI.  ASSESSMENT SALEM LASKOSKI  is a 62 y.o. male who presented to [facility] on 2/9 requesting detox from EtOH and help for anxiety, he was admitted to [facility] on 2/10.  PPHx is significant for MDD, GAD, Cocaine Use Disorder, EtOH Abuse. His anxiety seems to precipitate his alcohol use and contributed to his most recent EtOH use. He was started on remeron for anxiety which increased to 15 mg last night and appears to be tolerating this well. He is also open to starting naltrexone today. He is interested in inpatient rehab.    PLAN Alcohol Use Disorder  -Continue CIWA, last score= 0  @2$ /12 0601 -Continue Ativan taper to end today 2/13 -Continue Ativan 1 mg q6 PRN CIWA>10 -Start naltrexone 50 mg daily for alcohol use disorder  -Continue Imodium 2-4 mg PRN diarrhea -Continue Zofran-ODT 4 mg q6 PRN nausea -Continue Thiamine 100 mg daily for nutritional supplementation -Continue Multivitamin daily for nutritional supplementation   GAD: -Continue Remeron 15 mg QHS for anxiety   Home medications  -Continue Aspirin 81 mg daily -Continue Plavix 75 mg daily -Continue Zetia 10 mg daily  -Continue Cholecalciferol 5,000 u every other day -Continue Metformin 1,000 mg BID -Continue Metoprolol 100 mg BID  -Continue NitroGLYCERIN 0.4 mg PRN chest pain   -Continue PRN's: Tylenol, Maalox, Atarax, Milk of Magnesia, Trazodone  Dispo: Interested in Residential Treatment, will send referrals to [facility] and [place].   Rolanda Lundborg, MD, PGY-1 08/11/2022 10:49 AM

## 2022-08-11 NOTE — ED Notes (Signed)
Pt is in the bed sleeping. Respirations are even and unlabored. No acute distress noted. Will continue to monitor for safety. 

## 2022-08-11 NOTE — ED Notes (Signed)
Patient resting quietly in bed with eyes closed. Respirations equal and unlabored, skin warm and dry, NAD. No change in assessment or acuity. Q 15 minute safety checks remain in place.

## 2022-08-12 DIAGNOSIS — F1020 Alcohol dependence, uncomplicated: Secondary | ICD-10-CM | POA: Diagnosis not present

## 2022-08-12 DIAGNOSIS — F1410 Cocaine abuse, uncomplicated: Secondary | ICD-10-CM | POA: Diagnosis not present

## 2022-08-12 DIAGNOSIS — E1159 Type 2 diabetes mellitus with other circulatory complications: Secondary | ICD-10-CM | POA: Diagnosis not present

## 2022-08-12 DIAGNOSIS — F411 Generalized anxiety disorder: Secondary | ICD-10-CM | POA: Diagnosis not present

## 2022-08-12 LAB — GLUCOSE, CAPILLARY: Glucose-Capillary: 122 mg/dL — ABNORMAL HIGH (ref 70–99)

## 2022-08-12 MED ORDER — NALTREXONE HCL 50 MG PO TABS: 50.0000 mg | ORAL_TABLET | Freq: Every day | ORAL | 0 refills | Status: AC

## 2022-08-12 MED ORDER — ADULT MULTIVITAMIN W/MINERALS CH: 1.0000 | ORAL_TABLET | Freq: Every day | ORAL | Status: DC

## 2022-08-12 MED ORDER — MIRTAZAPINE 15 MG PO TABS: 15.0000 mg | ORAL_TABLET | Freq: Every day | ORAL | 0 refills | Status: DC

## 2022-08-12 MED ORDER — TRAZODONE HCL 50 MG PO TABS: 50.0000 mg | ORAL_TABLET | Freq: Every evening | ORAL | 0 refills | Status: DC | PRN

## 2022-08-12 NOTE — Group Note (Signed)
Group Topic: Relaxation  Group Date: 08/12/2022 Start Time: 1030 End Time: 1100 Facilitators: Quentin Cornwall B  Department: [facility]  Number of Participants: 6  Group Focus: relaxation Treatment Modality:  Psychoeducation Interventions utilized were other Pet Therapy Purpose: release tension   Name: [name] Date of Birth: 15-Jan-1961  MR: [id number]    Level of Participation: moderate Quality of Participation: attentive and cooperative Interactions with others: gave feedback Mood/Affect: positive Triggers (if applicable): na Cognition: coherent/clear Progress: Moderate Response: na Plan: follow-up needed  Patients Problems:  Patient Active Problem List   Diagnosis Date Noted   Alcohol dependence ([place]) 05/16/2022   Low energy 03/06/2022   Moderate alcohol dependence in early remission ([place]) 02/24/2022   Suicidal ideation 07/07/2021   Generalized anxiety disorder 07/07/2021   Other allergic rhinitis 09/09/2020   Adverse reaction to food, subsequent encounter 09/09/2020   History of bee sting allergy 09/09/2020   Alcohol dependence with alcohol-induced mood disorder ([place]) 03/19/2020   MDD (major depressive disorder), recurrent episode, severe ([place]) 03/18/2020   History of substance abuse ([place]) 01/04/2020   Severe 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 features ([name]) 12/10/2019   Tinea cruris 07/06/2019   Hemoglobin A1c less than 7.0% 07/06/2019   Muscle strain of wrist 04/05/2019   Pain in both wrists 04/05/2019   Anxiety 04/05/2019   Prediabetes 04/05/2019   MDD (major depressive disorder) 01/12/2019   Chest pain 10/25/2018   MDD (major depressive disorder), recurrent severe, without psychosis ([place]) 08/15/2018   Influenza A 07/17/2017   Lactic acidosis    Cough    Nausea and vomiting    Severe episode of recurrent major depressive disorder, without psychotic features ([place])    MDD (major depressive disorder), single episode,  severe with psychotic features ([place]) 06/27/2017   MDD (major depressive disorder), severe ([place]) 06/23/2017   Essential hypertension 01/13/2016   Unstable angina ([name])    ED (erectile dysfunction) 02/15/2014   Atypical chest pain 12/14/2011   Allergy to contrast media (used for diagnostic x-rays) 05/12/2011   Dyslipidemia, goal LDL below 70 05/09/2011   DM2 (diabetes mellitus, type 2) ([place]) 07/10/2010   CAD S/P percutaneous coronary angioplasty 07/10/2010

## 2022-08-12 NOTE — ED Notes (Signed)
Patient is awake and alert on unit.  He is presently eating breakfast and watching the news.  Patient is calm and cooperative with care with underlying anxiety.  Patient is without withdrawal symptoms and is pleasant and organized.  He makes needs known and appears motivated for treatment.  No distress.  Will monitor and provide a safe environment.

## 2022-08-12 NOTE — ED Notes (Signed)
Pt is in the bed sleeping. Respirations are even and unlabored. No acute distress noted. Will continue to monitor for safety. 

## 2022-08-12 NOTE — ED Notes (Signed)
Pt requested for Tylenol for back pain level 8 on a scale of 0-10.

## 2022-08-12 NOTE — ED Provider Notes (Signed)
FBC Progress Note  Date and Time: 08/12/2022 10:38 AM Name: [name] MRN:  [id number]  Reason For Admission: [name] is a 62 yr old male who presented to [facility] on 2/9 requesting detox from EtOH and help for anxiety, he was admitted to [facility] on 2/10.  PPHx is significant for MDD, GAD, Cocaine Use Disorder, EtOH Abuse.    Subjective: Patient reports significant improvement of anxiety symptoms this morning. He denies any withdrawal symptoms at this time. He reports he has been sleeping through the night. His appetite is fair, as he has trouble finding food that is consistent with his diabetic diet. He endorses some lower back pain this morning which has been managed with cold packs in the past. Discussed tylenol as needed to help with pain. He reports no SI, HI, or AVH. Patient was started on Naltrexone yesterday and has been managing well with no side effects. He denies side effects from Mirtazapine. He denies feeling excessively sedated. Other than low back pain, patient reports no concerns at present.   Diagnosis:  Final diagnoses:  GAD (generalized anxiety disorder)  Uncomplicated alcohol dependence ([place])    Total Time spent with patient: 30 minutes   Labs  Labs reviewed and notable for: No new labs today 2/14  Lab Results:     Latest Ref Rng & Units 08/07/2022    4:19 PM 05/16/2022    1:33 PM 12/04/2021    8:50 AM  CBC  WBC 4.0 - 10.5 K/uL 6.5  7.1  5.0   Hemoglobin 13.0 - 17.0 g/dL 14.3  15.4  14.3   Hematocrit 39.0 - 52.0 % 42.8  45.3  43.0   Platelets 150 - 400 K/uL 219  262  240       Latest Ref Rng & Units 08/11/2022    6:00 AM 05/16/2022    1:33 PM 01/20/2022    2:20 PM  CMP  Glucose 70 - 99 mg/dL 128  144  102   BUN 6 - 20 mg/dL 10  16  15   $ Creatinine 0.61 - 1.24 mg/dL 0.85  0.92  0.93   Sodium 135 - 145 mmol/L 140  140  139   Potassium 3.5 - 5.1 mmol/L 3.9  4.8  4.2   Chloride 98 - 111 mmol/L 105  100  107   CO2 22 - 32 mmol/L 27  20  23   $ Calcium 8.9 -  10.3 mg/dL 9.1  10.3  9.3   Total Protein 6.5 - 8.1 g/dL 5.7  6.8  6.2   Total Bilirubin 0.3 - 1.2 mg/dL 0.1  0.5  0.2   Alkaline Phos 38 - 126 U/L 50  61  69   AST 15 - 41 U/L 21  30  22   $ ALT 0 - 44 U/L 34  42  35     Physical Findings   AIMS    Flowsheet Row Admission (Discharged) from OP Visit from 09/07/2019 in [place] [facility] Admission (Discharged) from OP Visit from 01/12/2019 in [place] [facility] Admission (Discharged) from OP Visit from 08/15/2018 in [place] [facility] Admission (Discharged) from OP Visit from 10/02/2017 in [place] [facility] Admission (Discharged) from 06/22/2017 in [place] [facility]  AIMS Total Score 0 0 0 0 0      AUDIT    Flowsheet Row Office Visit from 08/27/2020 in [facility]  Admission (Discharged) from OP Visit from 03/18/2020 in [place] [facility] Admission (Discharged) from 12/10/2019 in [place] [facility] Admission (Discharged) from OP Visit from 09/07/2019 in [place] [facility] Admission (Discharged) from OP Visit from 01/12/2019 in [place] [facility]  Alcohol Use Disorder Identification Test Final Score (AUDIT) 27 28 13 25 $ 0      GAD-7    Flowsheet Row Clinical Support from 02/24/2022 in [facility] Office Visit from 01/08/2022 in [place] Video Visit from 11/19/2021 in [place] from 07/07/2021 in [facility] Office Visit from 08/27/2020 in [facility]  Total GAD-7 Score 2 12 13 19 7      $ PHQ2-9    [place] ED from 08/07/2022 in [facility] ED from 05/16/2022 in [facility]  Office Visit from 03/06/2022 in [place] from 02/24/2022 in [facility] Office Visit from 01/08/2022 in [place]  PHQ-2 Total Score 2 3 0 0 4  PHQ-9 Total Score 13 12 0 2 14      Flowsheet Row ED from 08/07/2022 in [facility] ED from 05/29/2022 in [facility]gent Care at [facility] ED from 05/16/2022 in [place] No Risk No Risk Low Risk        Musculoskeletal  Strength & Muscle Tone: within normal limits Gait & Station: normal Patient leans: N/A  Psychiatric Specialty Exam  Appearance and Grooming: Patient is casually dressed in scrubs . The patient has no noticeable scent or odor.  Motor activity: The patient's movement speed was normal; his gait was normal. There was no notable abnormal facial movements and no notable abnormal extremity movements. Behavior: The patient appears in no acute distress, and during the interview, was calm, focused, required minimal redirection, and behaving appropriately to scenario; he was able to follow commands and compliant to requests and made good eye contact. The patient did not appear internally or externally preoccupied. Attitude: Patient's attitude towards the interviewer was cooperative and open. Speech: The patient's speech was clear, fluent, with good articulation, and with appropriately placed inflections. The volume of his speech was normal and normal in quantity. The rate was normal with a normal rhythm. Responses were normal in latency. There were no abnormal patterns in speech. Mood: "Much improved"  Affect: Patient's affect is euthymic with broad range and even fluctuations; his affect is appropriate for the topic of conversation with his stated  mood. ------------------------------------------------------------------------------------------------------------------------- Thought Content The patient experiences no hallucinations. The patient describes no delusional thoughts; he denies thought insertion, denies thought withdrawal, denies thought interruption, and denies thought broadcasting. Patient at the time of interview denies active suicidal intent and denies passive suicidal ideation; he denies homicidal intent. Thought Process The patient's thought process is linear and is goal-directed. Insight The patient at the time of interview demonstrates fair insight, as evidenced by acknowledgement of substance use disorder/s. Judgement The patient over the past 24 hours demonstrates fair judgement, as evidenced by seeking placement for rehab.  Memory: intact  Executive Functions  Concentration: intact Attention Span: fair Recall: intact Fund of Knowledge: fair   Alertness/Orientation: alert and answering questions appropriately.   Assets  Assets: Armed forces logistics/support/administrative officer; Desire for Improvement; Financial Resources/Insurance; Housing; Resilience  Sleep  Sleep: Improved    Physical Exam  Physical Exam Vitals reviewed.  Constitutional:      Appearance: the patient is not toxic-appearing.  Pulmonary:     Effort: Pulmonary effort is normal.  Neurological:     General: No focal deficit present.     Mental Status: the patient is alert and answering questions appropriately.   Review of Systems  Respiratory:  Negative for shortness of breath.   Cardiovascular:  Negative for chest pain.  Gastrointestinal:  Negative for abdominal pain, constipation, diarrhea, nausea and vomiting.  Neurological:  Negative for headaches.   Blood pressure (!) 142/98, pulse 65, temperature 97.8 F (36.6 C), temperature source Oral, resp. rate 18, SpO2 98 %. There is no height or weight on file to calculate BMI.  ASSESSMENT [name] is a  62 y.o. male who presented to [facility] on 2/9 requesting detox from EtOH and help for anxiety, he was admitted to [facility] on 2/10.  PPHx is significant for MDD, GAD, Cocaine Use Disorder, EtOH Abuse. His anxiety seems to precipitate his alcohol use and contributed to his most recent EtOH use. He is tolerating medication well. He was started on Naltrexone 50 mg yesterday and reports no side effects. He is interested in inpatient rehab. CIWAs have been 0, 0 for the past 24 hours. Will discontinue given no signs of alcohol withdrawal.   PLAN Alcohol Use Disorder  -Stop CIWA, last score= 0  @2$ /14 0601 -Stop Ativan 1 mg q6 PRN CIWA>10 -Continue naltrexone 50 mg daily for alcohol use disorder  -Continue Thiamine 100 mg daily for nutritional supplementation -Continue Multivitamin daily for nutritional supplementation   GAD: -Continue Remeron 15 mg QHS for anxiety  Dyslipidemia:  -Continue Zetia 10 mg daily   DM2 -Continue Metformin 1,000 mg BID  History of CAD s/p PCI 2013, in-stent restenosis with PCI 09/2019 -Continue Aspirin 81 mg daily -Continue Plavix 75 mg daily -Continue Cholecalciferol 5,000 u every other day -Continue Metoprolol 100 mg BID  -Continue NitroGLYCERIN 0.4 mg PRN chest pain   -Continue PRN's: Tylenol, Maalox, Atarax, Milk of Magnesia, Trazodone  Dispo: Interested in Residential Treatment, pending [facility] or [facility], PGY-1 08/12/2022 10:38 AM   I personally was present and performed or re-performed the history, physical exam and medical decision-making activities of this service and have verified that the service and findings are accurately documented in the student's note.  Rolanda Lundborg, MD, PGY-1

## 2022-08-12 NOTE — ED Provider Notes (Signed)
FBC/OBS ASAP Discharge Summary  Date and Time: 08/12/2022 12:55 PM  Name: [name]  MRN:  [id number]   Discharge Diagnoses:  Final diagnoses:  GAD (generalized anxiety disorder)  Uncomplicated alcohol dependence ([place])  Type 2 diabetes mellitus with other circulatory complication, without long-term current use of insulin ([name])  MDD (major depressive disorder), severe ([name])  Generalized anxiety disorder    Subjective: Patient seen and assessed at bedside.  Patient reports wanting to discharge at this time because he does not want to wait here until next Tuesday for [place] residential placement.  He does state that he will go to [facility] at the appointed time but rather would want to go home first as he is unable to be provided with a diabetic friendly diet and the beds are uncomfortable causing worsening back pain.  He denies SI/HI/AVH and is able to contract for safety.  He states that he should he have alcohol cravings or reemergence of his alcohol use disorder, he will return promptly to the behavioral health urgent care to be reevaluated.   Stay Summary: [name]. Tuckerman is a 62 yr old male who presented to [facility] on 2/9 requesting detox from EtOH and help for anxiety, he was admitted to [facility] on 2/10.  PPHx is significant for MDD, GAD, Cocaine Use Disorder, EtOH Abuse.     Patient completed scheduled Ativan taper and monitor for alcohol withdrawal symptoms.  Patient's CIWA score continued to be 0 throughout hospitalization with scheduled Ativan taper.  Patient was started on naltrexone for medication assisted therapy for alcohol use disorder.  Patient endorsed poorly controlled anxiety, depression, as well as insomnia which was addressed with mirtazapine.  He was continued on his other home medications including those for his type 2 diabetes, dyslipidemia, hypertension, and CAD.  Total Time spent with patient: 45 minutes  Family History: younger brother history of drug addition, oldest  brother history of alcoholism and depression and oldest sister history of alcoholism and depression.   Social History: alcohol abuse and cocaine use. Unemployed on Disability. Tobacco Cessation:  N/A, patient does not currently use tobacco products  Current Medications:  Current Facility-Administered Medications  Medication Dose Route Frequency Provider Last Rate Last Admin   acetaminophen (TYLENOL) tablet 650 mg  650 mg Oral Q6H PRN White, Patrice L, NP   650 mg at 08/12/22 0604   alum & mag hydroxide-simeth (MAALOX/MYLANTA) 200-200-20 MG/5ML suspension 30 mL  30 mL Oral Q4H PRN White, Patrice L, NP       aspirin EC tablet 81 mg  81 mg Oral Daily White, Patrice L, NP   81 mg at 08/12/22 0916   cholecalciferol (VITAMIN D3) 25 MCG (1000 UNIT) tablet 5,000 Units  5,000 Units Oral QODAY White, Patrice L, NP   5,000 Units at 08/12/22 0916   clopidogrel (PLAVIX) tablet 75 mg  75 mg Oral Daily White, Patrice L, NP   75 mg at 08/12/22 0916   ezetimibe (ZETIA) tablet 10 mg  10 mg Oral Daily White, Patrice L, NP   10 mg at 08/12/22 0916   hydrOXYzine (ATARAX) tablet 25 mg  25 mg Oral TID PRN Darrol Angel L, NP   25 mg at 08/07/22 2148   magnesium hydroxide (MILK OF MAGNESIA) suspension 30 mL  30 mL Oral Daily PRN White, Patrice L, NP       metFORMIN (GLUCOPHAGE) tablet 1,000 mg  1,000 mg Oral BID WC White, Patrice L, NP   1,000 mg at 08/12/22 [phone]  metoprolol tartrate (LOPRESSOR) tablet 100 mg  100 mg Oral BID White, Patrice L, NP   100 mg at 08/12/22 0916   mirtazapine (REMERON) tablet 15 mg  15 mg Oral QHS Rolanda Lundborg, MD   15 mg at 08/11/22 2112   multivitamin with minerals tablet 1 tablet  1 tablet Oral Daily White, Patrice L, NP   1 tablet at 08/12/22 0916   naltrexone (DEPADE) tablet 50 mg  50 mg Oral Daily Rolanda Lundborg, MD   50 mg at 08/12/22 0916   nitroGLYCERIN (NITROSTAT) SL tablet 0.4 mg  0.4 mg Sublingual Q5 Min x 3 PRN White, Patrice L, NP       thiamine (VITAMIN B1) tablet 100  mg  100 mg Oral Daily White, Patrice L, NP   100 mg at 08/12/22 0916   traZODone (DESYREL) tablet 50 mg  50 mg Oral QHS PRN White, Patrice L, NP   50 mg at 08/11/22 2112   Current Outpatient Medications  Medication Sig Dispense Refill   Cholecalciferol 25 MCG (1000 UT) tablet Take 5,000 Units by mouth every other day.     clopidogrel (PLAVIX) 75 MG tablet Take 75 mg by mouth daily.     metoprolol tartrate (LOPRESSOR) 100 MG tablet Take 1 tablet (100 mg total) by mouth 2 (two) times daily. 180 tablet 3   amLODipine (NORVASC) 10 MG tablet Take 1 tablet by mouth once daily 90 tablet 3   aspirin 81 MG EC tablet Take 1 tablet (81 mg total) by mouth daily. (May buy from over the counter): Swallow whole for heart health (Patient taking differently: Take 81 mg by mouth at bedtime. (May buy from over the counter): Swallow whole for heart health) 30 tablet 0   EPINEPHrine 0.3 mg/0.3 mL IJ SOAJ injection Inject 0.3 mg into the muscle as needed for anaphylaxis. 1 each 2   ezetimibe (ZETIA) 10 MG tablet Take 1 tablet (10 mg total) by mouth daily. 30 tablet 2   glucose blood (ACCU-CHEK GUIDE) test strip USE AS DIRECTED to check blood sugars three times a day. 300 each 0   LIVALO 4 MG TABS Take 1 tablet by mouth once daily 90 tablet 3   metFORMIN (GLUCOPHAGE) 1000 MG tablet Take 1 tablet (1,000 mg total) by mouth 2 (two) times daily with a meal. 180 tablet 3   mirtazapine (REMERON) 15 MG tablet Take 1 tablet (15 mg total) by mouth at bedtime. 30 tablet 0   [START ON 08/13/2022] Multiple Vitamin (MULTIVITAMIN WITH MINERALS) TABS tablet Take 1 tablet by mouth daily.     [START ON 08/13/2022] naltrexone (DEPADE) 50 MG tablet Take 1 tablet (50 mg total) by mouth daily. 30 tablet 0   nitroGLYCERIN (NITROSTAT) 0.4 MG SL tablet Place 1 tablet (0.4 mg total) under the tongue every 5 (five) minutes x 3 doses as needed for chest pain. 25 tablet 3   Polyethyl Glycol-Propyl Glycol (SYSTANE) 0.4-0.3 % GEL ophthalmic gel Place  1 Application into both eyes daily as needed (For dry eyes).     Testosterone (ANDROGEL PUMP) 20.25 MG/ACT (1.62%) GEL Place 1 Pump onto the skin daily. 75 g 2   traZODone (DESYREL) 50 MG tablet Take 1 tablet (50 mg total) by mouth at bedtime as needed for sleep. 30 tablet 0    PTA Medications:  Facility Ordered Medications  Medication   acetaminophen (TYLENOL) tablet 650 mg   alum & mag hydroxide-simeth (MAALOX/MYLANTA) 200-200-20 MG/5ML suspension 30 mL   magnesium hydroxide (MILK  OF MAGNESIA) suspension 30 mL   hydrOXYzine (ATARAX) tablet 25 mg   traZODone (DESYREL) tablet 50 mg   [COMPLETED] thiamine (VITAMIN B1) injection 100 mg   thiamine (VITAMIN B1) tablet 100 mg   multivitamin with minerals tablet 1 tablet   [EXPIRED] LORazepam (ATIVAN) tablet 1 mg   [EXPIRED] loperamide (IMODIUM) capsule 2-4 mg   [EXPIRED] ondansetron (ZOFRAN-ODT) disintegrating tablet 4 mg   [COMPLETED] LORazepam (ATIVAN) tablet 1 mg   Followed by   [COMPLETED] LORazepam (ATIVAN) tablet 1 mg   Followed by   [COMPLETED] LORazepam (ATIVAN) tablet 1 mg   Followed by   [COMPLETED] LORazepam (ATIVAN) tablet 1 mg   aspirin EC tablet 81 mg   cholecalciferol (VITAMIN D3) 25 MCG (1000 UNIT) tablet 5,000 Units   clopidogrel (PLAVIX) tablet 75 mg   ezetimibe (ZETIA) tablet 10 mg   metFORMIN (GLUCOPHAGE) tablet 1,000 mg   metoprolol tartrate (LOPRESSOR) tablet 100 mg   nitroGLYCERIN (NITROSTAT) SL tablet 0.4 mg   mirtazapine (REMERON) tablet 15 mg   naltrexone (DEPADE) tablet 50 mg   PTA Medications  Medication Sig   metoprolol tartrate (LOPRESSOR) 100 MG tablet Take 1 tablet (100 mg total) by mouth 2 (two) times daily.   EPINEPHrine 0.3 mg/0.3 mL IJ SOAJ injection Inject 0.3 mg into the muscle as needed for anaphylaxis.   aspirin 81 MG EC tablet Take 1 tablet (81 mg total) by mouth daily. (May buy from over the counter): Swallow whole for heart health (Patient taking differently: Take 81 mg by mouth at  bedtime. (May buy from over the counter): Swallow whole for heart health)   nitroGLYCERIN (NITROSTAT) 0.4 MG SL tablet Place 1 tablet (0.4 mg total) under the tongue every 5 (five) minutes x 3 doses as needed for chest pain.   metFORMIN (GLUCOPHAGE) 1000 MG tablet Take 1 tablet (1,000 mg total) by mouth 2 (two) times daily with a meal.   Polyethyl Glycol-Propyl Glycol (SYSTANE) 0.4-0.3 % GEL ophthalmic gel Place 1 Application into both eyes daily as needed (For dry eyes).   amLODipine (NORVASC) 10 MG tablet Take 1 tablet by mouth once daily   LIVALO 4 MG TABS Take 1 tablet by mouth once daily   Testosterone (ANDROGEL PUMP) 20.25 MG/ACT (1.62%) GEL Place 1 Pump onto the skin daily.   glucose blood (ACCU-CHEK GUIDE) test strip USE AS DIRECTED to check blood sugars three times a day.   [START ON 08/13/2022] Multiple Vitamin (MULTIVITAMIN WITH MINERALS) TABS tablet Take 1 tablet by mouth daily.   [START ON 08/13/2022] naltrexone (DEPADE) 50 MG tablet Take 1 tablet (50 mg total) by mouth daily.   traZODone (DESYREL) 50 MG tablet Take 1 tablet (50 mg total) by mouth at bedtime as needed for sleep.   mirtazapine (REMERON) 15 MG tablet Take 1 tablet (15 mg total) by mouth at bedtime.       08/10/2022   11:09 AM 08/07/2022    3:01 PM 05/19/2022    1:59 PM  Depression screen PHQ 2/9  Decreased Interest 2 2 3  $ Down, Depressed, Hopeless 0 0 0  PHQ - 2 Score 2 2 3  $ Altered sleeping 3 2 0  Tired, decreased energy 3 2 3  $ Change in appetite 3 2 3  $ Feeling bad or failure about yourself  0 0 0  Trouble concentrating 2 2 3  $ Moving slowly or fidgety/restless 0 0 0  Suicidal thoughts 0 0 0  PHQ-9 Score 13 10 12  $ Difficult doing work/chores Very  difficult Somewhat difficult Very difficult    Flowsheet Row ED from 08/07/2022 in [facility] ED from 05/29/2022 in [facility] ED from 05/16/2022 in [place]  No Risk No Risk Low Risk       Musculoskeletal  Strength & Muscle Tone: within normal limits Gait & Station: normal Patient leans: N/A  Psychiatric Specialty Exam  Presentation  General Appearance:  Appropriate for Environment; Casual  Eye Contact: Good  Speech: Clear and Coherent; Slow  Speech Volume: Normal  Handedness: Right   Mood and Affect  Mood: Dysphoric; Anxious  Affect: Congruent; Constricted; Depressed   Thought Process  Thought Processes: Coherent; Goal Directed  Descriptions of Associations:Intact  Orientation:Full (Time, Place and Person)  Thought Content:WDL; Logical  Diagnosis of Schizophrenia or Schizoaffective disorder in past: No    Hallucinations:No data recorded Ideas of Reference:None  Suicidal Thoughts:No data recorded Homicidal Thoughts:No data recorded  Sensorium  Memory: Immediate Fair; Recent Fair  Judgment: Fair  Insight: Fair   Community education officer  Concentration: Good  Attention Span: Good  Recall: Good  Fund of Knowledge: Good  Language: Good   Psychomotor Activity  Psychomotor Activity:No data recorded  Assets  Assets: Communication Skills; Desire for Improvement; Financial Resources/Insurance; Housing; Resilience   Sleep  Sleep:No data recorded  No data recorded  Physical Exam  Physical Exam Vitals and nursing note reviewed.  Constitutional:      General: He is not in acute distress.    Appearance: He is well-developed.  HENT:     Head: Normocephalic and atraumatic.  Eyes:     Conjunctiva/sclera: Conjunctivae normal.  Cardiovascular:     Rate and Rhythm: Normal rate.     Heart sounds: No murmur heard. Pulmonary:     Effort: Pulmonary effort is normal. No respiratory distress.     Breath sounds: Normal breath sounds.  Abdominal:     Palpations: Abdomen is soft.     Tenderness: There is no abdominal tenderness.  Musculoskeletal:        General: No swelling.     Cervical  back: Neck supple.  Skin:    General: Skin is warm and dry.  Neurological:     Mental Status: He is alert.  Psychiatric:        Mood and Affect: Mood normal.    Review of Systems  Respiratory:  Negative for shortness of breath.   Cardiovascular:  Negative for chest pain.  Gastrointestinal:  Negative for abdominal pain, constipation, diarrhea, heartburn, nausea and vomiting.  Neurological:  Negative for headaches.   Blood pressure (!) 142/98, pulse 65, temperature 97.8 F (36.6 C), temperature source Oral, resp. rate 18, SpO2 98 %. There is no height or weight on file to calculate BMI.  Demographic Factors:  Male and Low socioeconomic status  Loss Factors: NA  Historical Factors: Impulsivity  Risk Reduction Factors:   Positive social support, Positive therapeutic relationship, and Positive coping skills or problem solving skills  Continued Clinical Symptoms:  Depression:   Insomnia Alcohol/Substance Abuse/Dependencies More than one psychiatric diagnosis Previous Psychiatric Diagnoses and Treatments  Cognitive Features That Contribute To Risk:  None    Suicide Risk:  Mild:  There are no identifiable plans, no associated intent, mild dysphoria and related symptoms, good self-control (both objective and subjective assessment), few other risk factors, and identifiable protective factors, including available and accessible social support.  Plan Of Care/Follow-up recommendations:   Follow-up recommendations:  Activity:  as tolerated Diet:  heart healthy   Comments:  Prescriptions were given at discharge.  Patient is agreeable with the discharge plan.  Patient was given an opportunity to ask questions.  Patient appears to feel comfortable with discharge and denies any current suicidal or homicidal thoughts.    Patient is instructed prior to discharge to: Take all medications as prescribed by mental healthcare provider. Report any adverse effects and or reactions from the  medicines to outpatient provider promptly. In the event of worsening symptoms, patient is instructed to call the crisis hotline, 911 and or go to the nearest ED for appropriate evaluation and treatment of symptoms. Patient is to follow-up with primary care provider for other medical issues, concerns and or health care needs.   France Ravens, MD 08/12/2022, 12:55 PM

## 2022-08-12 NOTE — Discharge Planning (Signed)
Referral was received and per Enis Gash, patient has been accepted and can transfer to the facility on 08/18/2022 by 9:00am. Update has been provided to the patient and MD made aware. Patient will need a 14-30 day supply of medication and one month refill. No nicotine gum allowed, however 14-30 day nicotine patches to be provided if needed. Patient asked if he could discharge on today and then follow up with [facility] on 08/18/2022. Patient aware that he would have to show up at 7:00am on that day for admission. MD made aware and will speak with the patient shortly. No other needs to report at this time.    LCSW will continue to follow up and provide updates as received.    Lucius Conn, LCSW Clinical Social Worker [place] [facility] Ph: [phone]

## 2022-08-12 NOTE — Discharge Planning (Signed)
LCSW spoke with patient to provide update regarding referral for [facility]. Patient informed that [facility]init[facility] Pester is still reviewing clinicals, however would not be able to admit if accepted until Tuesday the 20th. Patient aware that LCSW is awaiting phone call back regarding referral and will provide update once received. If patient is denied, then [facility] in [place] would be the second option for placement. Patient expressed understanding and no other needs were reported at this time.   Lucius Conn, LCSW Clinical Social Worker [place] [facility] Ph: [phone]

## 2022-08-20 ENCOUNTER — Encounter (HOSPITAL_COMMUNITY): Payer: Self-pay

## 2022-08-20 ENCOUNTER — Ambulatory Visit (HOSPITAL_COMMUNITY)
Admission: RE | Admit: 2022-08-20 | Discharge: 2022-08-20 | Disposition: A | Discharge status: Home / Self Care | Payer: Medicaid Other | Source: Ambulatory Visit | Attending: Internal Medicine | Admitting: Internal Medicine

## 2022-08-20 ENCOUNTER — Other Ambulatory Visit: Payer: Self-pay

## 2022-08-20 VITALS — BP 122/82 | HR 60 | Temp 97.5°F | Resp 20

## 2022-08-20 DIAGNOSIS — J069 Acute upper respiratory infection, unspecified: Secondary | ICD-10-CM

## 2022-08-20 MED ORDER — GUAIFENESIN ER 600 MG PO TB12: 1200.0000 mg | ORAL_TABLET | Freq: Two times a day (BID) | ORAL | 0 refills | Status: DC

## 2022-08-20 MED ORDER — PROMETHAZINE-DM 6.25-15 MG/5ML PO SYRP: 5.0000 mL | ORAL_SOLUTION | Freq: Every evening | ORAL | 0 refills | Status: DC | PRN

## 2022-08-20 MED ORDER — BENZONATATE 100 MG PO CAPS: 100.0000 mg | ORAL_CAPSULE | Freq: Three times a day (TID) | ORAL | 0 refills | Status: DC

## 2022-08-20 NOTE — Discharge Instructions (Addendum)
You have a viral upper respiratory infection.   Use the following medicines to help with symptoms: - Plain Mucinex (guaifenesin) over the counter as directed every 12 hours to thin mucous so that you are able to get it out of your body easier. Drink plenty of water while taking this medication so that it works well in your body (at least 8 cups a day).  - Tylenol 1,041m every 6 hours with food as needed for aches/pains or fever/chills.  - Tessalon perles every 8 hours as needed for cough. - Take Promethazine DM cough medication to help with your cough at nighttime so that you are able to sleep. Do not drive, drink alcohol, or go to work while taking this medication since it can make you sleepy. Only take this at nighttime.   1 tablespoon of honey in warm water and/or salt water gargles may also help with symptoms. Humidifier to your room will help add water to the air and reduce coughing.  If you develop any new or worsening symptoms, please return.  If your symptoms are severe, please go to the emergency room.  Follow-up with your primary care provider for further evaluation and management of your symptoms as well as ongoing wellness visits.  I hope you feel better!

## 2022-08-20 NOTE — ED Provider Notes (Signed)
[place]    CSN: [id number] Arrival date & time: 08/20/22  1514      History   Chief Complaint Chief Complaint  Patient presents with   Cough   Appointment    16:00    HPI [name] is a 62 y.o. male.   Patient presents to urgent care for evaluation of sore throat, nasal congestion, and cough for the last 4 days.  He states that some people at work have been sick in his office and he believes that he may have caught a viral illness from them.  None of his coworkers have tested for COVID-19 or influenza.  He has not had any fever, chills, nausea, vomiting, abdominal pain, diarrhea, constipation, flank pain, generalized bodyaches, or shortness of breath/chest pain.  No headache, rash, vision changes, or dizziness.  He does have a history of CAD and currently takes Plavix blood thinner.  Denies history of chronic respiratory problems.  He is a former smoker and quit approximately 4 years ago.  Denies current drug use.  He has not attempted use of any over-the-counter medications before coming to urgent care for symptoms.  Patient took a PCR COVID-19 test at work and this was negative yesterday.   Cough   Past Medical History:  Diagnosis Date   Alcohol dependence ([name])    Allergies    Arthritis    Chest pain    Chronic lower back pain    Chronic pain of right wrist    Coronary artery disease    a. Multiple prior caths/PCI. Cath 2013 with possible spasm of RCA, 70% ISR of mid LCx with subsequent DES to mLCx and prox LCX. b. H/o microvascular angina. c. Recurrent angina 08/2014 - s/p PTCA/DES to prox Cx, PTCA/CBA to OM1.  c. LHC 06/10/15 with patent stents and some ISR in LCX and OM-1 that was not flow limiting --> Rx    Dyslipidemia    a. Intolerant to many statins except tolerating Livalo.   GERD (gastroesophageal reflux disease)    H/O cardiac catheterization 10/25/2018   Heart attack [facility])    Hypertension    Myocardial infarction ([place]) ~ 2010   S/P  angioplasty with stent, DES, to proximal and mid LCX 12/15/11 12/15/2011   S/P foot surgery, right 04/2021   Shoulder pain    Stroke [facility])    pt. reports had a stroke around time of MI 2010   Type II diabetes mellitus ([place])    Unstable angina [facility])     Patient Active Problem List   Diagnosis Date Noted   Alcohol dependence ([place]) 05/16/2022   Low energy 03/06/2022   Moderate alcohol dependence in early remission ([place]) 02/24/2022   Suicidal ideation 07/07/2021   Generalized anxiety disorder 07/07/2021   Other allergic rhinitis 09/09/2020   Adverse reaction to food, subsequent encounter 09/09/2020   History of bee sting allergy 09/09/2020   Alcohol dependence with alcohol-induced mood disorder ([place]) 03/19/2020   MDD (major depressive disorder), recurrent episode, severe ([place]) 03/18/2020   History of substance abuse ([place]) 01/04/2020   Severe 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 features ([place]) 12/10/2019   Tinea cruris 07/06/2019   Hemoglobin A1c less than 7.0% 07/06/2019   Muscle strain of wrist 04/05/2019   Pain in both wrists 04/05/2019   Anxiety 04/05/2019   Prediabetes 04/05/2019   MDD (major depressive disorder) 01/12/2019   Chest pain 10/25/2018   MDD (major depressive disorder), recurrent severe, without psychosis ([place]) 08/15/2018   Influenza A  07/17/2017   Lactic acidosis    Cough    Nausea and vomiting    Severe episode of recurrent major depressive disorder, without psychotic features ([place])    MDD (major depressive disorder), single episode, severe with psychotic features ([place]) 06/27/2017   MDD (major depressive disorder), severe ([place]) 06/23/2017   Essential hypertension 01/13/2016   Unstable angina ([name])    ED (erectile dysfunction) 02/15/2014   Atypical chest pain 12/14/2011   Allergy to contrast media (used for diagnostic x-rays) 05/12/2011   Dyslipidemia, goal LDL below 70 05/09/2011   DM2 (diabetes mellitus, type 2) ([place]) 07/10/2010   CAD S/P  percutaneous coronary angioplasty 07/10/2010    Past Surgical History:  Procedure Laterality Date   CARDIAC CATHETERIZATION  06/15/2002   LAD with prox 40% stenosis, norma L main, Cfx with 25% lesion, RCA with long mid 25% stenosis (Dr. Vita Barley)   CARDIAC CATHETERIZATION  04/01/2010   normal L main, LAD wit mild stenosis, L Cfx with 70% in-stent restenosis, RCA with 70% in-stent restenosis, LVEF >60% (Dr. K. Mali Hilty) - cutting ballon arthrectomy to RCA & Cfx (Dr. Rockne Menghini)   CARDIAC CATHETERIZATION  08/25/2010   preserved global LV contractility; multivessel CAD, diffuse 90-95% in-stent restenosis in prox placed Cfx stent - cutting balloon arthrectomy in Cfx with multiple dilatations 90-95% to 0% stenosis (Dr. Corky Downs)   CARDIAC CATHETERIZATION  01/26/2011   PCI & stenting of aggresive in-stent restenosis within previously stented AV groove Cfx with 3.0x9m Taxus DES (previous stents were Promus) (Dr. JAdora Fridge   CARDIAC CATHETERIZATION  05/11/2011   preserved LV function, 40% mid LAD stenosis, 30-40% narrowing proximal to stented semgnet of prox Cfx, patent mid RCA stent with smooth 20% narrowing in distal RCA (Dr. TCorky Downs   CARDIAC CATHETERIZATION  12/15/2011   PCI & stenting of proximal & mid Cfx with DES - 3.0x18min proximal, 3.0x1558mn mid (Dr. J. Adora Fridge C[place]A 06/10/2015   Procedure: Left Heart Cath and Coronary Angiography;  Surgeon: DanJolaine ArtistD;  Location: [facility];  Service: Cardiovascular;  Laterality: N/A;   CARDIAC CATHETERIZATION  04/99991111cardiometabolic testing  2/[id number] exercise effort, peak VO2 79% predicted with normal VO2 HR curves (mild deconditioning)   COLONOSCOPY  12/2012   diminutive hyperplastic sigmoid poyp so repeat routine 2024   CORONARY BALLOON ANGIOPLASTY N/A 10/25/2018   Procedure: CORONARY BALLOON ANGIOPLASTY;  Surgeon: VarJettie BoozeD;  Location: [facility];  Service:  Cardiovascular;  Laterality: N/A;   CORONARY BALLOON ANGIOPLASTY N/A 09/29/2019   Procedure: CORONARY BALLOON ANGIOPLASTY;  Surgeon: EndNelva BushD;  Location: [facility];  Service: Cardiovascular;  Laterality: N/A;   EXCISIONAL HEMORRHOIDECTOMY  1984   INTRAVASCULAR ULTRASOUND/IVUS N/A 09/29/2019   Procedure: Intravascular Ultrasound/IVUS;  Surgeon: EndNelva BushD;  Location: [facility];  Service: Cardiovascular;  Laterality: N/A;   LEFT HEART CATH AND CORONARY ANGIOGRAPHY N/A 10/25/2018   Procedure: LEFT HEART CATH AND CORONARY ANGIOGRAPHY;  Surgeon: VarJettie BoozeD;  Location: [facility];  Service: Cardiovascular;  Laterality: N/A;   LEFT HEART CATH AND CORONARY ANGIOGRAPHY N/A 09/29/2019   Procedure: LEFT HEART CATH AND CORONARY ANGIOGRAPHY;  Surgeon: EndNelva BushD;  Location: [facility];  Service: Cardiovascular;  Laterality: N/A;   LEFT HEART CATHETERIZATION WITH CORONARY ANGIOGRAM N/A 05/11/2011   Procedure: LEFT HEART CATHETERIZATION WITH CORONARY ANGIOGRAM;  Surgeon: ThoJoyice Faster  Claiborne Billings, MD;  Location: [facility] C[facility] LAB;  Service: Cardiovascular;  Laterality: N/A;  Possible percutaneous coronary intervention, possible IVUS   LEFT HEART CATHETERIZATION WITH CORONARY ANGIOGRAM N/A 12/15/2011   Procedure: LEFT HEART CATHETERIZATION WITH CORONARY ANGIOGRAM;  Surgeon: Lorretta Harp, MD;  Location: [facility] CATH LAB;  Service: Cardiovascular;  Laterality: N/A;   LEFT HEART CATHETERIZATION WITH CORONARY ANGIOGRAM N/A 09/05/2014   Procedure: LEFT HEART CATHETERIZATION WITH CORONARY ANGIOGRAM;  Surgeon: Burnell Blanks, MD;  Location: [facility] CATH LAB;  Service: Cardiovascular;  Laterality: N/A;   LIPOMA EXCISION     back of the head   NM MYOCAR PERF WALL MOTION  02/2012   lexiscan myoview; mild perfusion defect in mid inferolateral & basal inferolateral region (infarct/scar); EF 52%, abnormal but ow risk scan   PERCUTANEOUS CORONARY STENT INTERVENTION (PCI-S)   09/05/2014   Procedure: PERCUTANEOUS CORONARY STENT INTERVENTION (PCI-S);  Surgeon: Burnell Blanks, MD;  Location: [facility] CATH LAB;  Service: Cardiovascular;;   TONSILLECTOMY         Home Medications    Prior to Admission medications   Medication Sig Start Date End Date Taking? Authorizing Provider  benzonatate (TESSALON) 100 MG capsule Take 1 capsule (100 mg total) by mouth every 8 (eight) hours. 08/20/22  Yes Talbot Grumbling, FNP  guaiFENesin (MUCINEX) 600 MG 12 hr tablet Take 2 tablets (1,200 mg total) by mouth 2 (two) times daily. 08/20/22  Yes Talbot Grumbling, FNP  promethazine-dextromethorphan (PROMETHAZINE-DM) 6.25-15 MG/5ML syrup Take 5 mLs by mouth at bedtime as needed for cough. 08/20/22  Yes Talbot Grumbling, FNP  amLODipine (NORVASC) 10 MG tablet Take 1 tablet by mouth once daily 02/04/22   Hilty, Nadean Corwin, MD  aspirin 81 MG EC tablet Take 1 tablet (81 mg total) by mouth daily. (May buy from over the counter): Swallow whole for heart health Patient taking differently: Take 81 mg by mouth at bedtime. (May buy from over the counter): Swallow whole for heart health 07/10/21   Ival Bible, MD  Cholecalciferol 25 MCG (1000 UT) tablet Take 5,000 Units by mouth every other day.    [provider]  clopidogrel (PLAVIX) 75 MG tablet Take 75 mg by mouth daily.    [provider]  EPINEPHrine 0.3 mg/0.3 mL IJ SOAJ injection Inject 0.3 mg into the muscle as needed for anaphylaxis. 05/07/21   Clemon Chambers, MD  ezetimibe (ZETIA) 10 MG tablet Take 1 tablet (10 mg total) by mouth daily. 03/06/22 06/04/22  Fenton Foy, NP  glucose blood (ACCU-CHEK GUIDE) test strip USE AS DIRECTED to check blood sugars three times a day. 04/30/22   Fenton Foy, NP  LIVALO 4 MG TABS Take 1 tablet by mouth once daily 02/16/22   Croitoru, Dani Gobble, MD  metFORMIN (GLUCOPHAGE) 1000 MG tablet Take 1 tablet (1,000 mg total) by mouth 2 (two) times daily with a meal. 01/08/22    Dorena Dew, FNP  metoprolol tartrate (LOPRESSOR) 100 MG tablet Take 1 tablet (100 mg total) by mouth 2 (two) times daily. 12/10/21 12/05/22  Pixie Casino, MD  mirtazapine (REMERON) 15 MG tablet Take 1 tablet (15 mg total) by mouth at bedtime. 08/12/22   France Ravens, MD  Multiple Vitamin (MULTIVITAMIN WITH MINERALS) TABS tablet Take 1 tablet by mouth daily. 08/13/22   France Ravens, MD  naltrexone (DEPADE) 50 MG tablet Take 1 tablet (50 mg total) by mouth daily. 08/13/22 09/12/22  France Ravens, MD  nitroGLYCERIN (NITROSTAT) 0.4 MG  SL tablet Place 1 tablet (0.4 mg total) under the tongue every 5 (five) minutes x 3 doses as needed for chest pain. 07/18/21   Hilty, Nadean Corwin, MD  Polyethyl Glycol-Propyl Glycol (SYSTANE) 0.4-0.3 % GEL ophthalmic gel Place 1 Application into both eyes daily as needed (For dry eyes).    [provider]  Testosterone (ANDROGEL PUMP) 20.25 MG/ACT (1.62%) GEL Place 1 Pump onto the skin daily. 04/08/22   Fenton Foy, NP  traZODone (DESYREL) 50 MG tablet Take 1 tablet (50 mg total) by mouth at bedtime as needed for sleep. 08/12/22   France Ravens, MD    Family History Family History  Problem Relation Age of Onset   Leukemia Mother    Prostate cancer Father    Cancer Brother    Coronary artery disease Paternal Grandmother    Cancer Paternal Grandfather    Migraines Neg Hx    Headache Neg Hx     Social History Social History   Tobacco Use   Smoking status: Former    Packs/day: 1.00    Years: 10.00    Total pack years: 10.00    Types: Cigarettes    Quit date: 10/07/2018    Years since quitting: 3.8   Smokeless tobacco: Never  Vaping Use   Vaping Use: Never used  Substance Use Topics   Alcohol use: Not Currently    Comment: 2 bottles of wine on 12/08/2019 (one occurrence)   Drug use: Not Currently    Types: Cocaine     Allergies   Bee venom, Gadolinium, Shellfish allergy, Statins, and Testosterone cypionate   Review of Systems Review of  Systems  Respiratory:  Positive for cough.   Per HPI   Physical Exam Triage Vital Signs ED Triage Vitals  Enc Vitals Group     BP 08/20/22 1602 122/82     Pulse Rate 08/20/22 1602 60     Resp 08/20/22 1602 20     Temp 08/20/22 1602 (!) 97.5 F (36.4 C)     Temp Source 08/20/22 1602 Oral     SpO2 08/20/22 1602 97 %     Weight --      Height --      Head Circumference --      Peak Flow --      Pain Score 08/20/22 1558 6     Pain Loc --      Pain Edu? --      Excl. in [place]? --    No data found.  Updated Vital Signs BP 122/82 (BP Location: Left Arm)   Pulse 60   Temp (!) 97.5 F (36.4 C) (Oral)   Resp 20   SpO2 97%   Visual Acuity Right Eye Distance:   Left Eye Distance:   Bilateral Distance:    Right Eye Near:   Left Eye Near:    Bilateral Near:     Physical Exam Vitals and nursing note reviewed.  Constitutional:      Appearance: He is not ill-appearing or toxic-appearing.  HENT:     Head: Normocephalic and atraumatic.     Right Ear: Hearing, tympanic membrane, ear canal and external ear normal.     Left Ear: Hearing, tympanic membrane, ear canal and external ear normal.     Nose: Congestion present.     Mouth/Throat:     Lips: Pink.     Mouth: Mucous membranes are moist. No injury.     Tongue: No lesions. Tongue does not deviate  from midline.     Palate: No mass and lesions.     Pharynx: Oropharynx is clear. Uvula midline. No pharyngeal swelling, oropharyngeal exudate, posterior oropharyngeal erythema or uvula swelling.     Tonsils: No tonsillar exudate or tonsillar abscesses.     Comments: Copious post nasal purulent drainage to the posterior oropharynx. Eyes:     General: Lids are normal. Vision grossly intact. Gaze aligned appropriately.     Extraocular Movements: Extraocular movements intact.     Conjunctiva/sclera: Conjunctivae normal.  Cardiovascular:     Rate and Rhythm: Normal rate and regular rhythm.     Heart sounds: Normal heart sounds, S1  normal and S2 normal.  Pulmonary:     Effort: Pulmonary effort is normal. No respiratory distress.     Breath sounds: Normal breath sounds and air entry. No wheezing, rhonchi or rales.  Musculoskeletal:     Cervical back: Neck supple.  Lymphadenopathy:     Cervical: No cervical adenopathy.  Skin:    General: Skin is warm and dry.     Capillary Refill: Capillary refill takes less than 2 seconds.     Findings: No rash.  Neurological:     General: No focal deficit present.     Mental Status: He is alert and oriented to person, place, and time. Mental status is at baseline.     Cranial Nerves: No dysarthria or facial asymmetry.  Psychiatric:        Mood and Affect: Mood normal.        Speech: Speech normal.        Behavior: Behavior normal.        Thought Content: Thought content normal.        Judgment: Judgment normal.      UC Treatments / Results  Labs (all labs ordered are listed, but only abnormal results are displayed) Labs Reviewed - No data to display  EKG   Radiology No results found.  Procedures Procedures (including critical care time)  Medications Ordered in UC Medications - No data to display  Initial Impression / Assessment and Plan / UC Course  I have reviewed the triage vital signs and the nursing notes.  Pertinent labs & imaging results that were available during my care of the patient were reviewed by me and considered in my medical decision making (see chart for details).   1. Viral URI with cough Symptoms and physical exam consistent with a viral upper respiratory tract infection that will likely resolve with rest, fluids, and prescriptions for symptomatic relief. Deferred imaging based on stable cardiopulmonary exam and hemodynamically stable vital signs. Deferred viral testing as he already tested negative for COVID-19 via PCR. Low suspicion for influenza/strep.   Guaifenesin, Promethazine DM, and Tessalon Perles sent to pharmacy for symptomatic  relief to be taken as prescribed.  May continue taking over the counter medications as directed for further symptomatic relief.  Drowsiness precautions discussed regarding promethazine DM prescription.  Nonpharmacologic interventions for symptom relief provided and after visit summary below. Advised to push fluids to stay well hydrated while recovering from viral illness.   Discussed physical exam and available lab work findings in clinic with patient.  Counseled patient regarding appropriate use of medications and potential side effects for all medications recommended or prescribed today. Discussed red flag signs and symptoms of worsening condition,when to call the PCP office, return to urgent care, and when to seek higher level of care in the emergency department. Patient verbalizes understanding and agreement with  plan. All questions answered. Patient discharged in stable condition.    Final Clinical Impressions(s) / UC Diagnoses   Final diagnoses:  Viral URI with cough     Discharge Instructions      You have a viral upper respiratory infection.   Use the following medicines to help with symptoms: - Plain Mucinex (guaifenesin) over the counter as directed every 12 hours to thin mucous so that you are able to get it out of your body easier. Drink plenty of water while taking this medication so that it works well in your body (at least 8 cups a day).  - Tylenol 1,014m every 6 hours with food as needed for aches/pains or fever/chills.  - Tessalon perles every 8 hours as needed for cough. - Take Promethazine DM cough medication to help with your cough at nighttime so that you are able to sleep. Do not drive, drink alcohol, or go to work while taking this medication since it can make you sleepy. Only take this at nighttime.   1 tablespoon of honey in warm water and/or salt water gargles may also help with symptoms. Humidifier to your room will help add water to the air and reduce  coughing.  If you develop any new or worsening symptoms, please return.  If your symptoms are severe, please go to the emergency room.  Follow-up with your primary care provider for further evaluation and management of your symptoms as well as ongoing wellness visits.  I hope you feel better!    ED Prescriptions     Medication Sig Dispense Auth. Provider   guaiFENesin (MUCINEX) 600 MG 12 hr tablet Take 2 tablets (1,200 mg total) by mouth 2 (two) times daily. 30 tablet SJoella PrinceM, FNP   benzonatate (TESSALON) 100 MG capsule Take 1 capsule (100 mg total) by mouth every 8 (eight) hours. 21 capsule STalbot Grumbling FNP   promethazine-dextromethorphan (PROMETHAZINE-DM) 6.25-15 MG/5ML syrup Take 5 mLs by mouth at bedtime as needed for cough. 118 mL STalbot Grumbling FNP      PDMP not reviewed this encounter.   STalbot Grumbling FNorth [place]02/22/24 1649

## 2022-08-20 NOTE — ED Triage Notes (Signed)
Symptoms started 4 days ago.  Complains of cough, sneezing, runny nose, itchy throat.    Patient has not had nay medicines for symptoms

## 2022-08-21 ENCOUNTER — Ambulatory Visit (HOSPITAL_COMMUNITY): Payer: Self-pay

## 2022-09-07 ENCOUNTER — Other Ambulatory Visit: Payer: Self-pay | Admitting: Nurse Practitioner

## 2022-10-09 ENCOUNTER — Other Ambulatory Visit: Payer: Self-pay | Admitting: Nurse Practitioner

## 2022-10-09 DIAGNOSIS — E785 Hyperlipidemia, unspecified: Secondary | ICD-10-CM

## 2022-10-29 ENCOUNTER — Ambulatory Visit: Payer: Medicaid Other | Admitting: Internal Medicine

## 2022-10-31 ENCOUNTER — Ambulatory Visit (HOSPITAL_COMMUNITY)
Admission: EM | Admit: 2022-10-31 | Discharge: 2022-10-31 | Disposition: A | Discharge status: Home / Self Care | Payer: Medicaid Other | Attending: Behavioral Health | Admitting: Behavioral Health

## 2022-10-31 DIAGNOSIS — F1010 Alcohol abuse, uncomplicated: Secondary | ICD-10-CM | POA: Insufficient documentation

## 2022-10-31 NOTE — Progress Notes (Signed)
   10/31/22 1750  [facility] Triage Screening (Walk-ins at [facility] only)  What Is the Reason for Your Visit/Call Today? [name]  is  a 62 year old male  who presents voluntarily to [facility].Marland Kitchenwanting to detox.  from alcohol. he comsumes gin and beer.Patient reports he consumes  1 liter of gin and 2 24 oz beer every 3 days. His lst use was earlier today. H reports that he will pass out and then have to stay in bed for th next 2-3 days. He has  dx of MDD.  He was pescribed  mtiazapine but has not taken in a while.  Patient denies SI/HI/AVH. Patient was alert & oriented x4 and casually dressed.  How Long Has This Been Causing You Problems? 1-6 months  Have You Recently Had Any Thoughts About Hurting Yourself? No  Are You Planning to Commit Suicide/Harm Yourself At This time? No  Have you Recently Had Thoughts About Hurting Someone Karolee Ohs? No  Are You Planning To Harm Someone At This Time? No  Are you currently experiencing any auditory, visual or other hallucinations? No  Have You Used Any Alcohol or Drugs in the Past 24 Hours? Yes  How long ago did you use Drugs or Alcohol? erlier today  What Did You Use and How Much? liter of gin  Do you have any current medical co-morbidities that require immediate attention? No  Clinician description of patient physical appearance/behavior: casually dressed, alert & oriented.  What Do You Feel Would Help You the Most Today? Alcohol or Drug Use Treatment  If access to [facility] was not available, would you have sought care in the Emergency Department? No  Determination of Need Routine (7 days)  Options For Referral Medication Management;Outpatient Therapy;Facility-Based Crisis

## 2022-10-31 NOTE — ED Provider Notes (Signed)
Behavioral Health Urgent Care Medical Screening Exam  Patient Name: [name] MRN: [id number] Date of Evaluation: 10/31/22 Chief Complaint: alcohol use    Diagnosis:  Final diagnoses:  Alcohol abuse    History of Present illness: [name] is a 62 y.o. male patient with a past psychiatric history significant for MDD, alcohol use disorder, cocaine use, and anxiety who presents to the [facility] behavioral health urgent care seeking treatment for alcohol use.  Patient seen and evaluated face-to-face by this provider, and chart reviewed. On evaluation, patient is alert and oriented x 4. His thought process is linear and speech is clear and coherent. He denies SI/HI/AVH. There is no objective evidence that the patient is currently responding to internal or external stimuli. His mood is euthymic and affect is congruent. He has fair eye contact. He appears casually dressed. He is calm and cooperative and does not appear to be in acute distress. Patient states that he is seeking treatment for alcohol use. He states that he has been drinking alcohol for the past 3 days, on average a bottle of gin. He states that he last consumed alcohol today. He denies alcohol withdrawal symptoms. No history of alcohol withdrawal seizures or DTs. He reports a sobriety of 29 days. He reports using cocaine occasionally. He states that he does not use cocaine that often. However, patient has a significant documented history of cocaine use. He states that his drug of choice is alcohol. He states that he follows up with [facility] for mental health treatment but is not taking any prescribed psychotropic medications. He resides alone. He is unemployed. I dicussed with the patient residential treatment options for substance abuse treatment. Patient states that he would like to go to a facility in [place]. He states that he has transportation. I discussed with the patient [facility] residential  [facility] in [facility] for long-term treatment. Theodoro Grist, counselor spoke to the patient at length to discuss and provide additional resources for substance abuse treatment. Patient agreeable to following up with residential services for substance abuse treatment.   Flowsheet Row ED from 10/31/2022 in [facility] ED from 08/20/2022 in [facility] ED from 08/07/2022 in [facility]  C-SSRS RISK CATEGORY No Risk No Risk No Risk       Psychiatric Specialty Exam  Presentation  General Appearance:Appropriate for Environment  Eye Contact:Fair  Speech:Clear and Coherent  Speech Volume:Normal  Handedness:Right   Mood and Affect  Mood: Euthymic  Affect: Congruent   Thought Process  Thought Processes: Goal Directed  Descriptions of Associations:Intact  Orientation:Full (Time, Place and Person)  Thought Content:Logical  Diagnosis of Schizophrenia or Schizoaffective disorder in past: No   Hallucinations:None  Ideas of Reference:None  Suicidal Thoughts:No  Homicidal Thoughts:No   Sensorium  Memory: Immediate Fair; Recent Fair; Remote Fair  Judgment: Fair  Insight: Fair   Art therapist  Concentration: Fair  Attention Span: Fair  Recall: [employer] of Knowledge: Fair  Language: Fair   Psychomotor Activity  Psychomotor Activity: Normal   Assets  Assets: Manufacturing systems engineer; Desire for Improvement; Leisure Time; Physical Health   Sleep  Sleep: Fair  Number of hours:  8   Physical Exam: Physical Exam HENT:     Head: Normocephalic.  Cardiovascular:     Rate and Rhythm: Normal rate.  Pulmonary:     Effort: Pulmonary effort is normal.  Musculoskeletal:  General: Normal range of motion.     Cervical back: Normal range of motion.  Neurological:     Mental Status: He is alert and oriented to person, place, and time.    Review of Systems   Constitutional: Negative.   HENT: Negative.    Eyes: Negative.   Respiratory: Negative.    Cardiovascular: Negative.   Gastrointestinal: Negative.   Genitourinary: Negative.   Musculoskeletal: Negative.   Neurological: Negative.   Endo/Heme/Allergies: Negative.    Blood pressure 118/80, pulse 96, temperature 98.1 F (36.7 C), temperature source Oral, resp. rate 18, SpO2 99 %. There is no height or weight on file to calculate BMI.  Musculoskeletal: Strength & Muscle Tone: within normal limits Gait & Station: normal Patient leans: N/A   [facility] MSE Discharge Disposition for Follow up and Recommendations: Based on my evaluation the patient does not appear to have an emergency medical condition and can be discharged with resources and follow up care in outpatient services for Medication Management, Individual Therapy, and Group Therapy   Layla Barter, NP 10/31/2022, 6:20 PM

## 2022-10-31 NOTE — Discharge Instructions (Addendum)
Discharge recommendations:   Medications: Patient is to take medications as prescribed. The patient or patient's guardian is to contact a medical professional and/or outpatient provider to address any new side effects that develop. The patient or the patient's guardian should update outpatient providers of any new medications and/or medication changes.   Outpatient Follow up: Please review list of outpatient resources for psychiatry and counseling. Please follow up with your primary care provider for all medical related needs.   Therapy: We recommend that patient participate in individual therapy to address mental health concerns.  Safety:   The following safety precautions should be taken:   No sharp objects. This includes scissors, razors, scrapers, and putty knives.   Chemicals should be removed and locked up.   Medications should be removed and locked up.   Weapons should be removed and locked up. This includes firearms, knives and instruments that can be used to cause injury.   The patient should abstain from use of illicit substances/drugs and abuse of any medications.  If symptoms worsen or do not continue to improve or if the patient becomes actively suicidal or homicidal then it is recommended that the patient return to the closest hospital emergency department, the [facility], or call 911 for further evaluation and treatment. National Suicide Prevention Lifeline 1-800-SUICIDE or 1-800-273-8255.  About 988 988 offers 24/7 access to trained crisis counselors who can help people experiencing mental health-related distress. People can call or text 988 or chat 988lifeline.org for themselves or if they are worried about a loved one who may need crisis support.   Please contact one of the following facilities to start medication management and therapy services:   [facility] [place] [address] (SECOND FLOOR)   [place], [place]  [address] Phone: [phone]  [facility]  [address] [place], [place] [address] Phone: [phone]  [facility] [place]  [address]  [place], [place] [address]  [facility] [place]  [address]  [place], [place] [address] Phone: [phone]    

## 2022-11-06 ENCOUNTER — Emergency Department: Payer: Medicaid Other

## 2022-11-06 ENCOUNTER — Other Ambulatory Visit: Payer: Self-pay

## 2022-11-06 ENCOUNTER — Emergency Department
Admission: EM | Admit: 2022-11-06 | Discharge: 2022-11-06 | Disposition: A | Discharge status: Home / Self Care | Payer: Medicaid Other | Attending: Student in an Organized Health Care Education/Training Program | Admitting: Student in an Organized Health Care Education/Training Program

## 2022-11-06 DIAGNOSIS — R112 Nausea with vomiting, unspecified: Secondary | ICD-10-CM | POA: Insufficient documentation

## 2022-11-06 DIAGNOSIS — R1084 Generalized abdominal pain: Secondary | ICD-10-CM | POA: Diagnosis present

## 2022-11-06 DIAGNOSIS — E119 Type 2 diabetes mellitus without complications: Secondary | ICD-10-CM | POA: Insufficient documentation

## 2022-11-06 LAB — URINALYSIS, ROUTINE W REFLEX MICROSCOPIC
Bacteria, UA: NONE SEEN
Bilirubin Urine: NEGATIVE
Glucose, UA: >=500 mg/dL — AB
Hgb urine dipstick: NEGATIVE
Ketones, ur: NEGATIVE mg/dL
Leukocytes,Ua: NEGATIVE
Nitrite: NEGATIVE
Protein, ur: 30 mg/dL — AB
Specific Gravity, Urine: 1.018 (ref 1.005–1.030)
pH: 6 (ref 5.0–8.0)

## 2022-11-06 LAB — COMPREHENSIVE METABOLIC PANEL
ALT: 48 U/L — ABNORMAL HIGH (ref 0–44)
AST: 27 U/L (ref 15–41)
Albumin: 4.3 g/dL (ref 3.5–5.0)
Alkaline Phosphatase: 61 U/L (ref 38–126)
Anion gap: 6 (ref 5–15)
BUN: 11 mg/dL (ref 8–23)
CO2: 24 mmol/L (ref 22–32)
Calcium: 8.8 mg/dL — ABNORMAL LOW (ref 8.9–10.3)
Chloride: 107 mmol/L (ref 98–111)
Creatinine, Ser: 1.05 mg/dL (ref 0.61–1.24)
GFR, Estimated: >60 mL/min (ref >60)
Glucose, Bld: 186 mg/dL — ABNORMAL HIGH (ref 70–99)
Potassium: 4.3 mmol/L (ref 3.5–5.1)
Sodium: 137 mmol/L (ref 135–145)
Total Bilirubin: 0.6 mg/dL (ref 0.3–1.2)
Total Protein: 6.8 g/dL (ref 6.5–8.1)

## 2022-11-06 LAB — CBC WITH DIFFERENTIAL/PLATELET
Abs Immature Granulocytes: 0.03 10*3/uL (ref 0.00–0.07)
Basophils Absolute: 0 10*3/uL (ref 0.0–0.1)
Basophils Relative: 0 %
Eosinophils Absolute: 0.1 10*3/uL (ref 0.0–0.5)
Eosinophils Relative: 1 %
HCT: 45.5 % (ref 39.0–52.0)
Hemoglobin: 14.8 g/dL (ref 13.0–17.0)
Immature Granulocytes: 0 %
Lymphocytes Relative: 14 %
Lymphs Abs: 1.2 10*3/uL (ref 0.7–4.0)
MCH: 27.9 pg (ref 26.0–34.0)
MCHC: 32.5 g/dL (ref 30.0–36.0)
MCV: 85.7 fL (ref 80.0–100.0)
Monocytes Absolute: 0.5 10*3/uL (ref 0.1–1.0)
Monocytes Relative: 6 %
Neutro Abs: 6.5 10*3/uL (ref 1.7–7.7)
Neutrophils Relative %: 79 %
Platelets: 214 10*3/uL (ref 150–400)
RBC: 5.31 MIL/uL (ref 4.22–5.81)
RDW: 12.4 % (ref 11.5–15.5)
WBC: 8.3 10*3/uL (ref 4.0–10.5)
nRBC: 0 % (ref 0.0–0.2)

## 2022-11-06 LAB — LIPASE, BLOOD: Lipase: 31 U/L (ref 11–51)

## 2022-11-06 MED ORDER — ONDANSETRON 4 MG PO TBDP: 4.0000 mg | ORAL_TABLET | Freq: Three times a day (TID) | ORAL | 0 refills | Status: DC | PRN

## 2022-11-06 MED ORDER — IOHEXOL 300 MG/ML  SOLN
100.0000 mL | Freq: Once | INTRAMUSCULAR | Status: AC | PRN
  Administered 2022-11-06: 100 mL via INTRAVENOUS

## 2022-11-06 MED ORDER — SODIUM CHLORIDE 0.9 % IV BOLUS
1000.0000 mL | Freq: Once | INTRAVENOUS | Status: AC
  Administered 2022-11-06: 1000 mL via INTRAVENOUS

## 2022-11-06 MED ORDER — MORPHINE SULFATE (PF) 4 MG/ML IV SOLN
4.0000 mg | INTRAVENOUS | Status: DC | PRN
  Administered 2022-11-06: 4 mg via INTRAVENOUS
  Filled 2022-11-06: qty 1

## 2022-11-06 MED ORDER — ONDANSETRON HCL 4 MG/2ML IJ SOLN
4.0000 mg | Freq: Once | INTRAMUSCULAR | Status: AC
  Administered 2022-11-06: 4 mg via INTRAVENOUS
  Filled 2022-11-06: qty 2

## 2022-11-06 NOTE — ED Triage Notes (Signed)
Pt reports that he woke up this am with abd pain and feeling like he knots in his abd, states that he went for breakfast and couldn't eat and went back to bed and began vomiting with diarrhea, reports that he felt well yesterday, pt is unaware of being exposed to illness

## 2022-11-06 NOTE — ED Notes (Signed)
See triage note; pt reports N/V/D that started earlier today; total of 2-3 times of V/D; knots/cramping abdominal pain 1/10 per pt currently; chills. Pt's resp reg/unlabored, skin dry and laying calmlyon stretcher currently. Pt given warm blanket.

## 2022-11-06 NOTE — ED Notes (Signed)
Pt given crackers, peanut butter and gingerale with verbal okay from EDP Robinson. Pt denies any needs currently.

## 2022-11-06 NOTE — ED Provider Notes (Signed)
[facility] Provider Note    Event Date/Time   First MD Initiated Contact with Patient 11/06/22 1205     (approximate)   History   Abdominal Pain   HPI  [name] is a 62 y.o. male history of alcohol dependence presents to the ER for evaluation of acute generalized abdominal pain reported as moderate to severe associated with nausea and vomiting started this morning.  Went to bed feeling normal last night.  He is a diabetic.  Has been compliant with his medications.  He is currently in rehab for alcohol addiction.  Denies any fevers or chills no known sick contacts.     Physical Exam   Triage Vital Signs: ED Triage Vitals  Enc Vitals Group     BP 11/06/22 0915 (!) 141/96     Pulse Rate 11/06/22 0915 70     Resp 11/06/22 0915 16     Temp 11/06/22 0915 98 F (36.7 C)     Temp Source 11/06/22 0915 Oral     SpO2 11/06/22 0915 97 %     Weight 11/06/22 0916 185 lb (83.9 kg)     Height 11/06/22 0916 5\' 8"  (1.727 m)     Head Circumference --      Peak Flow --      Pain Score 11/06/22 0916 4     Pain Loc --      Pain Edu? --      Excl. in GC? --     Most recent vital signs: Vitals:   11/06/22 1430 11/06/22 1500  BP: 122/88   Pulse: 75 67  Resp:    Temp:    SpO2: 96% 97%     Constitutional: Alert  Eyes: Conjunctivae are normal.  Head: Atraumatic. Nose: No congestion/rhinnorhea. Mouth/Throat: Mucous membranes are moist.   Neck: Painless ROM.  Cardiovascular:   Good peripheral circulation. Respiratory: Normal respiratory effort.  No retractions.  Gastrointestinal: Soft but distended with generalized tenderness.  No guarding or rebound. Musculoskeletal:  no deformity Neurologic:  MAE spontaneously. No gross focal neurologic deficits are appreciated.  Skin:  Skin is warm, dry and intact. No rash noted. Psychiatric: Mood and affect are normal. Speech and behavior are normal.    ED Results / Procedures / Treatments   Labs (all labs  ordered are listed, but only abnormal results are displayed) Labs Reviewed  COMPREHENSIVE METABOLIC PANEL - Abnormal; Notable for the following components:      Result Value   Glucose, Bld 186 (*)    Calcium 8.8 (*)    ALT 48 (*)    All other components within normal limits  URINALYSIS, ROUTINE W REFLEX MICROSCOPIC - Abnormal; Notable for the following components:   Color, Urine YELLOW (*)    APPearance CLEAR (*)    Glucose, UA >=500 (*)    Protein, ur 30 (*)    All other components within normal limits  LIPASE, BLOOD  CBC WITH DIFFERENTIAL/PLATELET     EKG ED ECG REPORT I, Willy Eddy, the attending physician, personally viewed and interpreted this ECG.   Date: 11/06/2022  EKG Time: 9:26  Rate: 9:26  Rhythm: sinus  Axis: normal  Intervals: normal  ST&T Change: no stemi, no depressions     RADIOLOGY Please see ED Course for my review and interpretation.  I personally reviewed all radiographic images ordered to evaluate for the above acute complaints and reviewed radiology reports and findings.  These findings were personally discussed with the  patient.  Please see medical record for radiology report.    PROCEDURES:  Critical Care performed: No  Procedures   MEDICATIONS ORDERED IN ED: Medications  morphine (PF) 4 MG/ML injection 4 mg (4 mg Intravenous Given 11/06/22 1301)  sodium chloride 0.9 % bolus 1,000 mL (0 mLs Intravenous Stopped 11/06/22 1446)  ondansetron (ZOFRAN) injection 4 mg (4 mg Intravenous Given 11/06/22 1258)  iohexol (OMNIPAQUE) 300 MG/ML solution 100 mL (100 mLs Intravenous Contrast Given 11/06/22 1339)     IMPRESSION / MDM / ASSESSMENT AND PLAN / ED COURSE  I reviewed the triage vital signs and the nursing notes.                              Differential diagnosis includes, but is not limited to, enteritis, SBO, pancreatitis, diverticulitis, appendicitis, perforation, dehydration  Patient presenting to the ER for evaluation of  symptoms as described above.  Based on symptoms, risk factors and considered above differential, this presenting complaint could reflect a potentially life-threatening illness therefore the patient will be placed on continuous pulse oximetry and telemetry for monitoring.  Laboratory evaluation will be sent to evaluate for the above complaints.  CT imaging will be ordered for the blood differential.  Will order IV fluids as well as IV morphine and antiemetic.   Clinical Course as of 11/06/22 1505  Fri Nov 06, 2022  1357 CT imaging on my review and interpretation without evidence of perforation and do not identify any evidence of appendicitis. [PR]  1505 Patient tolerating p.o.  Feels well.  At this point does appear stable and appropriate for outpatient follow-up. [PR]    Clinical Course User Index [PR] Willy Eddy, MD     FINAL CLINICAL IMPRESSION(S) / ED DIAGNOSES   Final diagnoses:  Generalized abdominal pain  Nausea and vomiting, unspecified vomiting type     Rx / DC Orders   ED Discharge Orders          Ordered    ondansetron (ZOFRAN-ODT) 4 MG disintegrating tablet  Every 8 hours PRN        11/06/22 1504             Note:  This document was prepared using Dragon voice recognition software and may include unintentional dictation errors.    Willy Eddy, MD 11/06/22 [phone]

## 2022-11-06 NOTE — Discharge Instructions (Addendum)
  IMPRESSION: 1. No acute findings in the abdomen pelvis. 2. Normal appendix. 3. Indeterminate lesion in the cortex of the RIGHT kidney. Recommend MRI with and without contrast for further characterization. 4.  Aortic Atherosclerosis (ICD10-I70.0).     Electronically Signed   By: Genevive Bi M.D.   On: 11/06/2022 14:18

## 2022-11-07 ENCOUNTER — Emergency Department
Admission: EM | Admit: 2022-11-07 | Discharge: 2022-11-07 | Disposition: A | Discharge status: Home / Self Care | Payer: Medicaid Other | Attending: Emergency Medicine | Admitting: Emergency Medicine

## 2022-11-07 DIAGNOSIS — R109 Unspecified abdominal pain: Secondary | ICD-10-CM | POA: Diagnosis present

## 2022-11-07 DIAGNOSIS — R1013 Epigastric pain: Secondary | ICD-10-CM | POA: Diagnosis not present

## 2022-11-07 DIAGNOSIS — R112 Nausea with vomiting, unspecified: Secondary | ICD-10-CM | POA: Insufficient documentation

## 2022-11-07 DIAGNOSIS — E119 Type 2 diabetes mellitus without complications: Secondary | ICD-10-CM | POA: Diagnosis not present

## 2022-11-07 LAB — COMPREHENSIVE METABOLIC PANEL
ALT: 38 U/L (ref 0–44)
AST: 21 U/L (ref 15–41)
Albumin: 3.8 g/dL (ref 3.5–5.0)
Alkaline Phosphatase: 65 U/L (ref 38–126)
Anion gap: 8 (ref 5–15)
BUN: 11 mg/dL (ref 8–23)
CO2: 24 mmol/L (ref 22–32)
Calcium: 8.6 mg/dL — ABNORMAL LOW (ref 8.9–10.3)
Chloride: 108 mmol/L (ref 98–111)
Creatinine, Ser: 0.76 mg/dL (ref 0.61–1.24)
GFR, Estimated: >60 mL/min (ref >60)
Glucose, Bld: 195 mg/dL — ABNORMAL HIGH (ref 70–99)
Potassium: 4 mmol/L (ref 3.5–5.1)
Sodium: 140 mmol/L (ref 135–145)
Total Bilirubin: 0.4 mg/dL (ref 0.3–1.2)
Total Protein: 6.3 g/dL — ABNORMAL LOW (ref 6.5–8.1)

## 2022-11-07 LAB — CBC WITH DIFFERENTIAL/PLATELET
Abs Immature Granulocytes: 0.02 10*3/uL (ref 0.00–0.07)
Basophils Absolute: 0 10*3/uL (ref 0.0–0.1)
Basophils Relative: 0 %
Eosinophils Absolute: 0.1 10*3/uL (ref 0.0–0.5)
Eosinophils Relative: 2 %
HCT: 45.4 % (ref 39.0–52.0)
Hemoglobin: 14.7 g/dL (ref 13.0–17.0)
Immature Granulocytes: 0 %
Lymphocytes Relative: 14 %
Lymphs Abs: 0.7 10*3/uL (ref 0.7–4.0)
MCH: 27.9 pg (ref 26.0–34.0)
MCHC: 32.4 g/dL (ref 30.0–36.0)
MCV: 86.1 fL (ref 80.0–100.0)
Monocytes Absolute: 0.4 10*3/uL (ref 0.1–1.0)
Monocytes Relative: 7 %
Neutro Abs: 3.7 10*3/uL (ref 1.7–7.7)
Neutrophils Relative %: 77 %
Platelets: 196 10*3/uL (ref 150–400)
RBC: 5.27 MIL/uL (ref 4.22–5.81)
RDW: 12.6 % (ref 11.5–15.5)
WBC: 4.9 10*3/uL (ref 4.0–10.5)
nRBC: 0 % (ref 0.0–0.2)

## 2022-11-07 LAB — LIPASE, BLOOD: Lipase: 27 U/L (ref 11–51)

## 2022-11-07 MED ORDER — METOCLOPRAMIDE HCL 10 MG PO TABS
10.0000 mg | ORAL_TABLET | Freq: Once | ORAL | Status: AC
  Administered 2022-11-07: 10 mg via ORAL
  Filled 2022-11-07: qty 1

## 2022-11-07 MED ORDER — METOCLOPRAMIDE HCL 10 MG PO TABS: 10.0000 mg | ORAL_TABLET | Freq: Three times a day (TID) | ORAL | 1 refills | Status: DC

## 2022-11-07 NOTE — ED Notes (Signed)
First Nurse note: pt BIB ems from home with c/o lower abd pain that has been ongoing for 3 days.  Pt seen here today for same and discharged.  Pt told to f/u with pcp but could not take the pain.

## 2022-11-07 NOTE — ED Triage Notes (Signed)
Pt to ED via [facility] c/o abd pain. Pt reports lower abd pain that feels like knots, started yesterday. Was here for same yesterday. Was told to follow up with PCP. Pt also endorses some vomiting. Denies CP/SOB

## 2022-11-07 NOTE — ED Notes (Signed)
PT brought to ed rm 16 at this time, this RN now assuming care.  

## 2022-11-07 NOTE — ED Notes (Signed)
Spoke with Alinda Money at [facility]" he stated that they will pick the pt up but not until approx 0830. Alinda Money stated pt may wait in the waiting room and they will pick him up from there. Pt notified.

## 2022-11-07 NOTE — ED Provider Notes (Signed)
[facility] Provider Note   Event Date/Time   First MD Initiated Contact with Patient 11/07/22 [phone]     (approximate) History  Abdominal Pain  HPI [name] is a 62 y.o. male with a past medical history of type 2 diabetes who presents complaining of postprandial abdominal pain with associated vomiting.  Patient denies any known history of gastroparesis.  Patient states that this has been present over the last 3 days and has has not been worsening.  Patient states that some episodes of p.o. intake he cannot tolerate others "everything comes up just like I ate it".  Patient also endorses epigastric abdominal pain during these episodes.  Patient states that symptoms have resolved at this point ROS: Patient currently denies any vision changes, tinnitus, difficulty speaking, facial droop, sore throat, chest pain, shortness of breath, diarrhea, dysuria, or weakness/numbness/paresthesias in any extremity   Physical Exam  Triage Vital Signs: ED Triage Vitals [11/07/22 0237]  Enc Vitals Group     BP (!) 139/100     Pulse Rate 79     Resp 20     Temp 99.1 F (37.3 C)     Temp Source Oral     SpO2 97 %     Weight      Height      Head Circumference      Peak Flow      Pain Score 5     Pain Loc      Pain Edu?      Excl. in GC?    Most recent vital signs: Vitals:   11/07/22 0330 11/07/22 0400  BP: 122/85 124/88  Pulse: 85 82  Resp:    Temp:    SpO2: 95% 96%   General: Asleep and easily awoken, oriented x4. CV:  Good peripheral perfusion.  Resp:  Normal effort.  Abd:  No distention.  Nontender to palpation Other:  Elderly overweight African-American male asleep in bed in no acute distress ED Results / Procedures / Treatments  Labs (all labs ordered are listed, but only abnormal results are displayed) Labs Reviewed  COMPREHENSIVE METABOLIC PANEL - Abnormal; Notable for the following components:      Result Value   Glucose, Bld 195 (*)    Calcium  8.6 (*)    Total Protein 6.3 (*)    All other components within normal limits  LIPASE, BLOOD  CBC WITH DIFFERENTIAL/PLATELET  URINALYSIS, ROUTINE W REFLEX MICROSCOPIC  RADIOLOGY ED MD interpretation:   -Agree with radiology assessment Official radiology report(s): CT ABDOMEN PELVIS W CONTRAST  Result Date: 11/06/2022 CLINICAL DATA:  Abdominal pain. Acute nonlocalized. Vomiting and diarrhea. EXAM: CT ABDOMEN AND PELVIS WITH CONTRAST TECHNIQUE: Multidetector CT imaging of the abdomen and pelvis was performed using the standard protocol following bolus administration of intravenous contrast. RADIATION DOSE REDUCTION: This exam was performed according to the departmental dose-optimization program which includes automated exposure control, adjustment of the mA and/or kV according to patient size and/or use of iterative reconstruction technique. CONTRAST:  OMNIPAQUE IOHEXOL 300 MG/ML  SOLN COMPARISON:  None Available. FINDINGS: Lower chest: Lung bases are clear. Hepatobiliary: No focal hepatic lesion. Normal gallbladder. No biliary duct dilatation. Common bile duct is normal. Pancreas: Pancreas is normal. No ductal dilatation. No pancreatic inflammation. Spleen: Normal spleen Adrenals/urinary tract: Adrenal glands are normal. Intermediate density (HU equal 43) lesion in the cortex of the RIGHT kidney measures 13 mm (image 39/series 2) Ureters and bladder normal. Stomach/Bowel: Stomach, small bowel,  appendix, and cecum are normal. The colon and rectosigmoid colon are normal. Vascular/Lymphatic: Abdominal aorta is normal caliber with atherosclerotic calcification. There is no retroperitoneal or periportal lymphadenopathy. No pelvic lymphadenopathy. Reproductive: Prostate unremarkable Other: No free fluid. Musculoskeletal: No aggressive osseous lesion. IMPRESSION: 1. No acute findings in the abdomen pelvis. 2. Normal appendix. 3. Indeterminate lesion in the cortex of the RIGHT kidney. Recommend MRI with and  without contrast for further characterization. 4.  Aortic Atherosclerosis (ICD10-I70.0). Electronically Signed   By: Genevive Bi M.D.   On: 11/06/2022 14:18   PROCEDURES: Critical Care performed: No .1-3 Lead EKG Interpretation  Performed by: Merwyn Katos, MD Authorized by: Merwyn Katos, MD     Interpretation: normal     ECG rate:  71   ECG rate assessment: normal     Rhythm: sinus rhythm     Ectopy: none     Conduction: normal    MEDICATIONS ORDERED IN ED: Medications  metoCLOPramide (REGLAN) tablet 10 mg (has no administration in time range)   IMPRESSION / MDM / ASSESSMENT AND PLAN / ED COURSE  I reviewed the triage vital signs and the nursing notes.                             The patient is on the cardiac monitor to evaluate for evidence of arrhythmia and/or significant heart rate changes. Patient's presentation is most consistent with acute presentation with potential threat to life or bodily function. Patient presents for acute nausea/vomiting with associated epigastric abdominal pain The cause of the patients symptoms is not clear, but the patient is overall well appearing and is suspected to have a transient course of illness.  I am concerned for possible gastroparesis and this poorly controlled type II diabetic  Given History and Exam there does not appear to be an emergent cause of the symptoms such as small bowel obstruction, coronary syndrome, bowel ischemia, DKA, pancreatitis, appendicitis, other acute abdomen or other emergent problem.  Reassessment: After treatment, the patient is feeling much better, tolerating PO fluids, and shows no signs of dehydration.  Rx: Reglan Disposition: Discharge home with prompt primary care physician follow up in the next 48 hours. Strict return precautions discussed.   FINAL CLINICAL IMPRESSION(S) / ED DIAGNOSES   Final diagnoses:  Epigastric pain  Nausea and vomiting, unspecified vomiting type   Rx / DC Orders   ED  Discharge Orders          Ordered    metoCLOPramide (REGLAN) 10 MG tablet  3 times daily with meals        11/07/22 0536           Note:  This document was prepared using Dragon voice recognition software and may include unintentional dictation errors.   Merwyn Katos, MD 11/07/22 [phone]

## 2022-11-07 NOTE — ED Notes (Signed)
ED Provider at bedside. 

## 2022-11-10 ENCOUNTER — Other Ambulatory Visit: Payer: Self-pay

## 2022-11-10 ENCOUNTER — Emergency Department
Admission: EM | Admit: 2022-11-10 | Discharge: 2022-11-10 | Disposition: A | Discharge status: Home / Self Care | Payer: Medicaid Other | Attending: Emergency Medicine | Admitting: Emergency Medicine

## 2022-11-10 DIAGNOSIS — R109 Unspecified abdominal pain: Secondary | ICD-10-CM

## 2022-11-10 DIAGNOSIS — E119 Type 2 diabetes mellitus without complications: Secondary | ICD-10-CM | POA: Insufficient documentation

## 2022-11-10 LAB — CBC
HCT: 49.2 % (ref 39.0–52.0)
Hemoglobin: 15.7 g/dL (ref 13.0–17.0)
MCH: 27.8 pg (ref 26.0–34.0)
MCHC: 31.9 g/dL (ref 30.0–36.0)
MCV: 87.2 fL (ref 80.0–100.0)
Platelets: 209 10*3/uL (ref 150–400)
RBC: 5.64 MIL/uL (ref 4.22–5.81)
RDW: 12.4 % (ref 11.5–15.5)
WBC: 6.1 10*3/uL (ref 4.0–10.5)
nRBC: 0 % (ref 0.0–0.2)

## 2022-11-10 LAB — COMPREHENSIVE METABOLIC PANEL
ALT: 38 U/L (ref 0–44)
AST: 23 U/L (ref 15–41)
Albumin: 4.1 g/dL (ref 3.5–5.0)
Alkaline Phosphatase: 69 U/L (ref 38–126)
Anion gap: 9 (ref 5–15)
BUN: 13 mg/dL (ref 8–23)
CO2: 29 mmol/L (ref 22–32)
Calcium: 9.2 mg/dL (ref 8.9–10.3)
Chloride: 100 mmol/L (ref 98–111)
Creatinine, Ser: 1 mg/dL (ref 0.61–1.24)
GFR, Estimated: >60 mL/min (ref >60)
Glucose, Bld: 243 mg/dL — ABNORMAL HIGH (ref 70–99)
Potassium: 4.1 mmol/L (ref 3.5–5.1)
Sodium: 138 mmol/L (ref 135–145)
Total Bilirubin: 0.4 mg/dL (ref 0.3–1.2)
Total Protein: 6.8 g/dL (ref 6.5–8.1)

## 2022-11-10 LAB — LIPASE, BLOOD: Lipase: 30 U/L (ref 11–51)

## 2022-11-10 MED ORDER — DICYCLOMINE HCL 10 MG/5ML PO SOLN
10.0000 mg | Freq: Once | ORAL | Status: AC
  Administered 2022-11-10: 10 mg via ORAL
  Filled 2022-11-10: qty 5

## 2022-11-10 MED ORDER — DROPERIDOL 2.5 MG/ML IJ SOLN
2.5000 mg | Freq: Once | INTRAMUSCULAR | Status: AC
  Administered 2022-11-10: 2.5 mg via INTRAMUSCULAR
  Filled 2022-11-10: qty 2

## 2022-11-10 MED ORDER — ALUM & MAG HYDROXIDE-SIMETH 200-200-20 MG/5ML PO SUSP
30.0000 mL | Freq: Once | ORAL | Status: AC
  Administered 2022-11-10: 30 mL via ORAL
  Filled 2022-11-10: qty 30

## 2022-11-10 MED ORDER — METOCLOPRAMIDE HCL 10 MG PO TABS
10.0000 mg | ORAL_TABLET | Freq: Once | ORAL | Status: AC
  Administered 2022-11-10: 10 mg via ORAL
  Filled 2022-11-10: qty 1

## 2022-11-10 MED ORDER — DICYCLOMINE HCL 20 MG PO TABS: 20.0000 mg | ORAL_TABLET | Freq: Four times a day (QID) | ORAL | 0 refills | Status: DC | PRN

## 2022-11-10 MED ORDER — FAMOTIDINE 20 MG PO TABS: 20.0000 mg | ORAL_TABLET | Freq: Two times a day (BID) | ORAL | 0 refills | Status: DC

## 2022-11-10 MED ORDER — HYDROCODONE-ACETAMINOPHEN 5-325 MG PO TABS
1.0000 | ORAL_TABLET | Freq: Once | ORAL | Status: AC
  Administered 2022-11-10: 1 via ORAL
  Filled 2022-11-10: qty 1

## 2022-11-10 NOTE — ED Triage Notes (Signed)
Pt here via [facility] with /co of lower ABD pain. Pt here with N/V x3 days.   CBG: 261 97.6 Oral HR: 79 98% RA 129/83

## 2022-11-10 NOTE — ED Provider Notes (Signed)
[facility] Provider Note    Event Date/Time   First MD Initiated Contact with Patient 11/10/22 [phone]     (approximate)   History   Abdominal Pain   HPI  [name] is a 62 y.o. male presenting to the emergency department for evaluation of abdominal pain.  Patient ports that a few days ago he had onset of a crampy abdominal pain initially more in his upper abdomen, but now involves his lower abdomen as well, similar in character.  He was seen in our ER on 5/10 and 5/11 for similar complaints.  During that time, his lab work was reassuring, had a CT without acute findings, incidental renal lesion noted.  He was discharged on Reglan, but reports he is currently in inpatient rehab, expected to be discharged today and has not had access to any outpatient prescriptions.  He does not feel that he has any ongoing withdrawal symptoms.  Does have a history of diabetes, denies known history of gastroparesis.  Presents back today given ongoing symptoms.     Physical Exam   Triage Vital Signs: ED Triage Vitals [11/10/22 0926]  Enc Vitals Group     BP (!) 116/92     Pulse Rate 79     Resp 18     Temp 98.4 F (36.9 C)     Temp src      SpO2 93 %     Weight 185 lb (83.9 kg)     Height 5\' 8"  (1.727 m)     Head Circumference      Peak Flow      Pain Score 7     Pain Loc      Pain Edu?      Excl. in GC?     Most recent vital signs: Vitals:   11/10/22 0926  BP: (!) 116/92  Pulse: 79  Resp: 18  Temp: 98.4 F (36.9 C)  SpO2: 93%     General: Awake, interactive  CV:  Regular rate, good peripheral perfusion.  Resp:  Lungs clear, unlabored respirations.  Abd:  Soft, nondistended, mild generalized tenderness without rebound or guarding Neuro:  Symmetric facial movement, fluid speech   ED Results / Procedures / Treatments   Labs (all labs ordered are listed, but only abnormal results are displayed) Labs Reviewed  COMPREHENSIVE METABOLIC PANEL -  Abnormal; Notable for the following components:      Result Value   Glucose, Bld 243 (*)    All other components within normal limits  LIPASE, BLOOD  CBC     EKG EKG independently reviewed interpreted by myself (ER attending) demonstrates:    RADIOLOGY Imaging independently reviewed and interpreted by myself demonstrates:    PROCEDURES:  Critical Care performed: No  Procedures   MEDICATIONS ORDERED IN ED: Medications  metoCLOPramide (REGLAN) tablet 10 mg (10 mg Oral Given 11/10/22 1026)  HYDROcodone-acetaminophen (NORCO/VICODIN) 5-325 MG per tablet 1 tablet (1 tablet Oral Given 11/10/22 1026)  alum & mag hydroxide-simeth (MAALOX/MYLANTA) 200-200-20 MG/5ML suspension 30 mL (30 mLs Oral Given 11/10/22 1027)  dicyclomine (BENTYL) 10 MG/5ML solution 10 mg (10 mg Oral Given 11/10/22 1146)  droperidol (INAPSINE) 2.5 MG/ML injection 2.5 mg (2.5 mg Intramuscular Given 11/10/22 1144)     IMPRESSION / MDM / ASSESSMENT AND PLAN / ED COURSE  I reviewed the triage vital signs and the nursing notes.  Differential diagnosis includes, but is not limited to, gastroparesis, pancreatitis, gastritis, lower suspicion for significant acute intra-abdominal process  given recent negative CT and overall reassuring abdominal exam  Patient's presentation is most consistent with acute presentation with potential threat to life or bodily function.  62 year old male presenting with ongoing abdominal pain with overall reassuring exam.  Labs sent from triage.  Do not think there is an indication for repeat imaging given no significant change in symptoms since recent CT.  Will treat symptomatically with pain and nausea medicine and reevaluate.  Patient reports some ongoing symptoms after initial medications.  Did trial droperidol and patient reports improvement in his symptoms following this.  Previously thought that patient's pain may be related to gastroparesis, do think this is still possible.  Consideration  for gastritis as well.  Will DC with prescription for Bentyl and Pepcid in addition to previously prescribed metoclopramide.  Discussed this with the patient.  He is comfortable with discharge home.  He will follow-up with primary care doctor for further evaluation.  Strict return precautions provided.      FINAL CLINICAL IMPRESSION(S) / ED DIAGNOSES   Final diagnoses:  Acute abdominal pain     Rx / DC Orders   ED Discharge Orders          Ordered    dicyclomine (BENTYL) 20 MG tablet  Every 6 hours PRN        11/10/22 1222    famotidine (PEPCID) 20 MG tablet  2 times daily        11/10/22 1222             Note:  This document was prepared using Dragon voice recognition software and may include unintentional dictation errors.   Trinna Post, MD 11/10/22 1225

## 2022-11-10 NOTE — Discharge Instructions (Addendum)
You were seen in the emergency room today for your ongoing abdominal pain.  Your lab work was reassuring.  Your imaging from a few days ago was also reassuring.  I suspect that you may have irritation of your stomach lining and is gastritis or abnormal motility of your gut due to your diabetes.  Please follow-up with your primary care doctor for further evaluation.  They may refer you to a specialist.  You can take the nausea medicine that you were prescribed previously.  I have also sent an anticramping medicine and an antiacid medicine to your pharmacy to help with your symptoms.  Return to the ER for new or worsening symptoms.

## 2022-11-10 NOTE — ED Triage Notes (Signed)
Pt to ED [facility] for generalized abd pain started 3 days ago. +n/v/d.  Last ETO[facility] 6 days ago

## 2022-11-11 ENCOUNTER — Telehealth: Payer: Self-pay

## 2022-11-11 NOTE — Transitions of Care (Post Inpatient/ED Visit) (Signed)
   11/11/2022  Name: DIKEMBE ULLAND MRN: [id number] DOB: 1960/08/15  Today's TOC FU Call Status: Today's TOC FU Call Status:: Unsuccessul Call (1st Attempt) Unsuccessful Call (1st Attempt) Date: 11/11/22  Attempted to reach the patient regarding the most recent Inpatient/ED visit.  Follow Up Plan: Additional outreach attempts will be made to reach the patient to complete the Transitions of Care (Post Inpatient/ED visit) call.   Signature Gh

## 2022-11-12 ENCOUNTER — Telehealth: Payer: Self-pay

## 2022-11-12 NOTE — Telephone Encounter (Signed)
Pt was called back concerning pain. Pt was lvm to call back to schedule first available appt due to pain.   Renelda Loma RMA

## 2022-11-12 NOTE — Transitions of Care (Post Inpatient/ED Visit) (Signed)
   11/12/2022  Name: [name] MRN: [id number] DOB: 11/22/1960  Today's TOC FU Call Status: Today's TOC FU Call Status:: Unsuccessul Call (1st Attempt)  Attempted to reach the patient regarding the most recent Inpatient/ED visit.  Follow Up Plan: Additional outreach attempts will be made to reach the patient to complete the Transitions of Care (Post Inpatient/ED visit) call.   Signature Renelda Loma RMA

## 2022-11-16 ENCOUNTER — Ambulatory Visit (INDEPENDENT_AMBULATORY_CARE_PROVIDER_SITE_OTHER): Payer: Medicaid Other | Admitting: Nurse Practitioner

## 2022-11-16 VITALS — BP 123/74 | HR 77 | Temp 97.2°F | Wt 197.0 lb

## 2022-11-16 DIAGNOSIS — R1013 Epigastric pain: Secondary | ICD-10-CM

## 2022-11-16 DIAGNOSIS — R7989 Other specified abnormal findings of blood chemistry: Secondary | ICD-10-CM

## 2022-11-16 DIAGNOSIS — E1159 Type 2 diabetes mellitus with other circulatory complications: Secondary | ICD-10-CM

## 2022-11-16 DIAGNOSIS — R197 Diarrhea, unspecified: Secondary | ICD-10-CM

## 2022-11-16 DIAGNOSIS — N289 Disorder of kidney and ureter, unspecified: Secondary | ICD-10-CM

## 2022-11-16 MED ORDER — METFORMIN HCL ER 500 MG PO TB24: 500.0000 mg | ORAL_TABLET | Freq: Every day | ORAL | 2 refills | Status: DC

## 2022-11-16 NOTE — Progress Notes (Unsigned)
@Patient  ID: [name], male    DOB: 12/03/1960, 62 y.o.   MRN: [id number]  Chief Complaint  Patient presents with   Abdominal Pain    Hospital follow up    Referring provider: Ivonne Andrew, NP   HPI  Patient presents today for an ED follow-up visit.  He was seen in the ED on 3 separate occasions on 11/06/2022, 11/07/2022, and 11/10/2022 for abdominal pain/epigastric pain.  We will place a referral to GI for the patient.  Patient states that he is stable at this time.  CT of abdomen did show incidental finding of indeterminate lesion to right kidney.  We will base a referral for patient to nephrology for further workup and evaluation.  The CT scan showed no acute findings related to abdominal pain.  Patient states that he has not been taking metformin as directed.  He cut himself back to 500 mg daily due to quitting alcohol.  We will change metformin to extended release due to GI upset.  Patient is also requesting today to have his testosterone rechecked. Denies f/c/s, n/v/d, hemoptysis, PND, leg swelling Denies chest pain or edema       Allergies  Allergen Reactions   Bee Venom Anaphylaxis, Hives, Itching and Other (See Comments)    Red eyes   Gadolinium Hives, Itching, Swelling and Other (See Comments)    Swelling of eyes after receiving MR contrast. 13 hr prep recommended. Pt developed large hive and swelling under right eye after contrast, given 50mg  iv benadryl, pt will need premed before gadolinium//lh   Shellfish Allergy Anaphylaxis, Hives, Itching and Other (See Comments)    Red eyes   Statins Other (See Comments)    Myalgias. Tolerating livalo. Pain    Testosterone Cypionate Other (See Comments)    Testerone Injection --Increased breast tissue     Immunization History  Administered Date(s) Administered   Influenza,inj,Quad PF,6+ Mos 07/28/2017, 04/01/2018, 03/05/2021   MODERNA COVID-19 SARS-COV-2 PEDS BIVALENT BOOSTER 6Y-11Y 12/09/2019, 01/10/2020   Moderna  SARS-COV2 Booster Vaccination 08/13/2020   Pneumococcal Polysaccharide-23 07/28/2017, 03/05/2021   Pneumococcal-Unspecified 06/29/2009   Tdap 02/08/2020   Zoster Recombinat (Shingrix) 01/11/2013    Past Medical History:  Diagnosis Date   Alcohol dependence ([name])    Allergies    Arthritis    Chest pain    Chronic lower back pain    Chronic pain of right wrist    Coronary artery disease    a. Multiple prior caths/PCI. Cath 2013 with possible spasm of RCA, 70% ISR of mid LCx with subsequent DES to mLCx and prox LCX. b. H/o microvascular angina. c. Recurrent angina 08/2014 - s/p PTCA/DES to prox Cx, PTCA/CBA to OM1.  c. LHC 06/10/15 with patent stents and some ISR in LCX and OM-1 that was not flow limiting --> Rx    Dyslipidemia    a. Intolerant to many statins except tolerating Livalo.   GERD (gastroesophageal reflux disease)    H/O cardiac catheterization 10/25/2018   Heart attack [facility])    Hypertension    Myocardial infarction [facility]) ~ 2010   S/P angioplasty with stent, DES, to proximal and mid LCX 12/15/11 12/15/2011   S/P foot surgery, right 04/2021   Shoulder pain    Stroke [facility])    pt. reports had a stroke around time of MI 2010   Type II diabetes mellitus ([name])    Unstable angina ([name])     Tobacco History: Social History   Tobacco Use  Smoking Status  Former   Packs/day: 1.00   Years: 10.00   Additional pack years: 0.00   Total pack years: 10.00   Types: Cigarettes   Quit date: 10/07/2018   Years since quitting: 4.1  Smokeless Tobacco Never   Counseling given: Not Answered   Outpatient Encounter Medications as of 11/16/2022  Medication Sig   amLODipine (NORVASC) 10 MG tablet Take 1 tablet by mouth once daily   aspirin 81 MG EC tablet Take 1 tablet (81 mg total) by mouth daily. (May buy from over the counter): Swallow whole for heart health (Patient taking differently: Take 81 mg by mouth at bedtime. (May buy from over the counter): Swallow whole for heart health)    Cholecalciferol 25 MCG (1000 UT) tablet Take 5,000 Units by mouth every other day.   dicyclomine (BENTYL) 20 MG tablet Take 1 tablet (20 mg total) by mouth every 6 (six) hours as needed for up to 7 days for spasms.   EPINEPHrine 0.3 mg/0.3 mL IJ SOAJ injection Inject 0.3 mg into the muscle as needed for anaphylaxis.   ezetimibe (ZETIA) 10 MG tablet Take 1 tablet by mouth once daily   famotidine (PEPCID) 20 MG tablet Take 1 tablet (20 mg total) by mouth 2 (two) times daily.   glucose blood (ACCU-CHEK GUIDE) test strip USE 1  TO CHECK GLUCOSE THREE TIMES DAILY AS DIRECTED   LIVALO 4 MG TABS Take 1 tablet by mouth once daily   metFORMIN (GLUCOPHAGE-XR) 500 MG 24 hr tablet Take 1 tablet (500 mg total) by mouth daily with breakfast.   metoCLOPramide (REGLAN) 10 MG tablet Take 1 tablet (10 mg total) by mouth 3 (three) times daily with meals.   metoprolol tartrate (LOPRESSOR) 100 MG tablet Take 1 tablet (100 mg total) by mouth 2 (two) times daily.   Multiple Vitamin (MULTIVITAMIN WITH MINERALS) TABS tablet Take 1 tablet by mouth daily.   nitroGLYCERIN (NITROSTAT) 0.4 MG SL tablet Place 1 tablet (0.4 mg total) under the tongue every 5 (five) minutes x 3 doses as needed for chest pain.   ondansetron (ZOFRAN-ODT) 4 MG disintegrating tablet Take 1 tablet (4 mg total) by mouth every 8 (eight) hours as needed for nausea or vomiting.   Polyethyl Glycol-Propyl Glycol (SYSTANE) 0.4-0.3 % GEL ophthalmic gel Place 1 Application into both eyes daily as needed (For dry eyes).   Testosterone (ANDROGEL PUMP) 20.25 MG/ACT (1.62%) GEL Place 1 Pump onto the skin daily.   traZODone (DESYREL) 50 MG tablet Take 1 tablet (50 mg total) by mouth at bedtime as needed for sleep.   [DISCONTINUED] metFORMIN (GLUCOPHAGE) 1000 MG tablet Take 1 tablet (1,000 mg total) by mouth 2 (two) times daily with a meal.   benzonatate (TESSALON) 100 MG capsule Take 1 capsule (100 mg total) by mouth every 8 (eight) hours. (Patient not taking:  Reported on 11/16/2022)   clopidogrel (PLAVIX) 75 MG tablet Take 75 mg by mouth daily.   guaiFENesin (MUCINEX) 600 MG 12 hr tablet Take 2 tablets (1,200 mg total) by mouth 2 (two) times daily. (Patient not taking: Reported on 11/16/2022)   mirtazapine (REMERON) 15 MG tablet Take 1 tablet (15 mg total) by mouth at bedtime. (Patient not taking: Reported on 11/16/2022)   promethazine-dextromethorphan (PROMETHAZINE-DM) 6.25-15 MG/5ML syrup Take 5 mLs by mouth at bedtime as needed for cough. (Patient not taking: Reported on 11/16/2022)   No facility-administered encounter medications on file as of 11/16/2022.     Review of Systems  Review of Systems  Constitutional: Negative.  HENT: Negative.    Cardiovascular: Negative.   Gastrointestinal: Negative.   Allergic/Immunologic: Negative.   Neurological: Negative.   Psychiatric/Behavioral: Negative.         Physical Exam  BP 123/74   Pulse 77   Temp (!) 97.2 F (36.2 C)   Wt 197 lb (89.4 kg)   SpO2 99%   BMI 29.95 kg/m   Wt Readings from Last 5 Encounters:  11/16/22 197 lb (89.4 kg)  11/10/22 185 lb (83.9 kg)  11/06/22 185 lb (83.9 kg)  03/06/22 186 lb 4 oz (84.5 kg)  01/08/22 191 lb 6.4 oz (86.8 kg)     Physical Exam Vitals and nursing note reviewed.  Constitutional:      General: He is not in acute distress.    Appearance: He is well-developed.  Cardiovascular:     Rate and Rhythm: Normal rate and regular rhythm.  Pulmonary:     Effort: Pulmonary effort is normal.     Breath sounds: Normal breath sounds.  Skin:    General: Skin is warm and dry.  Neurological:     Mental Status: He is alert and oriented to person, place, and time.      Lab Results:  CBC    Component Value Date/Time   WBC 6.1 11/10/2022 0928   RBC 5.64 11/10/2022 0928   HGB 15.7 11/10/2022 0928   HGB 14.3 12/04/2021 0850   HCT 49.2 11/10/2022 0928   HCT 43.0 12/04/2021 0850   PLT 209 11/10/2022 0928   PLT 240 12/04/2021 0850   MCV 87.2  11/10/2022 0928   MCV 85 12/04/2021 0850   MCH 27.8 11/10/2022 0928   MCHC 31.9 11/10/2022 0928   RDW 12.4 11/10/2022 0928   RDW 12.8 12/04/2021 0850   LYMPHSABS 0.7 11/07/2022 0240   LYMPHSABS 1.9 10/31/2020 1230   MONOABS 0.4 11/07/2022 0240   EOSABS 0.1 11/07/2022 0240   EOSABS 0.3 10/31/2020 1230   BASOSABS 0.0 11/07/2022 0240   BASOSABS 0.1 10/31/2020 1230    BMET    Component Value Date/Time   NA 138 11/10/2022 0928   NA 140 12/04/2021 0850   K 4.1 11/10/2022 0928   CL 100 11/10/2022 0928   CO2 29 11/10/2022 0928   GLUCOSE 243 (H) 11/10/2022 0928   BUN 13 11/10/2022 0928   BUN 14 12/04/2021 0850   CREATININE 1.00 11/10/2022 0928   CREATININE 0.87 12/08/2016 0900   CALCIUM 9.2 11/10/2022 0928   GFRNONAA >60 11/10/2022 0928   GFRNONAA >89 12/08/2016 0900   GFRAA >60 03/24/2020 0750   GFRAA >89 12/08/2016 0900    BNP    Component Value Date/Time   BNP 31.4 09/28/2019 0959    ProBNP No results found for: "PROBNP"  Imaging: CT ABDOMEN PELVIS W CONTRAST  Result Date: 11/06/2022 CLINICAL DATA:  Abdominal pain. Acute nonlocalized. Vomiting and diarrhea. EXAM: CT ABDOMEN AND PELVIS WITH CONTRAST TECHNIQUE: Multidetector CT imaging of the abdomen and pelvis was performed using the standard protocol following bolus administration of intravenous contrast. RADIATION DOSE REDUCTION: This exam was performed according to the departmental dose-optimization program which includes automated exposure control, adjustment of the mA and/or kV according to patient size and/or use of iterative reconstruction technique. CONTRAST:  OMNIPAQUE IOHEXOL 300 MG/ML  SOLN COMPARISON:  None Available. FINDINGS: Lower chest: Lung bases are clear. Hepatobiliary: No focal hepatic lesion. Normal gallbladder. No biliary duct dilatation. Common bile duct is normal. Pancreas: Pancreas is normal. No ductal dilatation. No pancreatic inflammation. Spleen: Normal  spleen Adrenals/urinary tract: Adrenal  glands are normal. Intermediate density (HU equal 43) lesion in the cortex of the RIGHT kidney measures 13 mm (image 39/series 2) Ureters and bladder normal. Stomach/Bowel: Stomach, small bowel, appendix, and cecum are normal. The colon and rectosigmoid colon are normal. Vascular/Lymphatic: Abdominal aorta is normal caliber with atherosclerotic calcification. There is no retroperitoneal or periportal lymphadenopathy. No pelvic lymphadenopathy. Reproductive: Prostate unremarkable Other: No free fluid. Musculoskeletal: No aggressive osseous lesion. IMPRESSION: 1. No acute findings in the abdomen pelvis. 2. Normal appendix. 3. Indeterminate lesion in the cortex of the RIGHT kidney. Recommend MRI with and without contrast for further characterization. 4.  Aortic Atherosclerosis (ICD10-I70.0). Electronically Signed   By: Genevive Bi M.D.   On: 11/06/2022 14:18     Assessment & Plan:   DM2 (diabetes mellitus, type 2) ([name]) - Microalbumin/Creatinine Ratio, Urine - AMB Referral to Pharmacy Medication Management  2. Epigastric pain  - Ambulatory referral to Gastroenterology  3. Diarrhea, unspecified type  - Ambulatory referral to Gastroenterology  4. Renal lesion  - Ambulatory referral to Nephrology  5. Low testosterone  - Testosterone; Future  Follow up:  Follow up in 3 months     Ivonne Andrew, NP 11/19/2022

## 2022-11-17 ENCOUNTER — Telehealth: Payer: Self-pay

## 2022-11-17 ENCOUNTER — Encounter: Payer: Self-pay | Admitting: Physician Assistant

## 2022-11-17 NOTE — Progress Notes (Signed)
   Care Guide Note  11/17/2022 Name: GUMESINDO BLANCETT MRN: [id number] DOB: 02-08-1961  Referred by: Ivonne Andrew, NP Reason for referral : Care Coordination (Outreach to schedule with Pharm d )   YOSIEL ROTTINGHAUS is a 62 y.o. year old male who is a primary care patient of Ivonne Andrew, NP. Vesta Mixer was referred to the pharmacist for assistance related to DM.    Successful contact was made with the patient to discuss pharmacy services including being ready for the pharmacist to call at least 5 minutes before the scheduled appointment time, to have medication bottles and any blood sugar or blood pressure readings ready for review. The patient agreed to meet with the pharmacist via with the pharmacist via telephone visit on (date/time).  12/15/2022  Penne Lash, RMA Care Guide [facility]  [place], [place] [address] Direct Dial: [phone] [email or web address]

## 2022-11-18 ENCOUNTER — Other Ambulatory Visit: Payer: Medicaid Other

## 2022-11-18 ENCOUNTER — Other Ambulatory Visit: Payer: Self-pay | Admitting: Nurse Practitioner

## 2022-11-18 DIAGNOSIS — Z13220 Encounter for screening for lipoid disorders: Secondary | ICD-10-CM

## 2022-11-18 DIAGNOSIS — R7989 Other specified abnormal findings of blood chemistry: Secondary | ICD-10-CM

## 2022-11-19 ENCOUNTER — Encounter: Payer: Self-pay | Admitting: Nurse Practitioner

## 2022-11-19 LAB — TESTOSTERONE: Testosterone: 397 ng/dL (ref 264–916)

## 2022-11-19 LAB — MICROALBUMIN / CREATININE URINE RATIO
Creatinine, Urine: 134 mg/dL
Microalb/Creat Ratio: <2 mg/g creat (ref 0–29)
Microalbumin, Urine: <3 ug/mL

## 2022-11-19 LAB — LIPID PANEL
Chol/HDL Ratio: 3.3 ratio (ref 0.0–5.0)
Cholesterol, Total: 114 mg/dL (ref 100–199)
HDL: 35 mg/dL — ABNORMAL LOW (ref >39)
LDL Chol Calc (NIH): 56 mg/dL (ref 0–99)
Triglycerides: 130 mg/dL (ref 0–149)
VLDL Cholesterol Cal: 23 mg/dL (ref 5–40)

## 2022-11-19 NOTE — Patient Instructions (Signed)
1. Type 2 diabetes mellitus with other circulatory complication, without long-term current use of insulin ([name])  - Microalbumin/Creatinine Ratio, Urine - AMB Referral to Pharmacy Medication Management  2. Epigastric pain  - Ambulatory referral to Gastroenterology  3. Diarrhea, unspecified type  - Ambulatory referral to Gastroenterology  4. Renal lesion  - Ambulatory referral to Nephrology  5. Low testosterone  - Testosterone; Future  Follow up:  Follow up in 3 months

## 2022-11-19 NOTE — Assessment & Plan Note (Signed)
-   Microalbumin/Creatinine Ratio, Urine - AMB Referral to Pharmacy Medication Management  2. Epigastric pain  - Ambulatory referral to Gastroenterology  3. Diarrhea, unspecified type  - Ambulatory referral to Gastroenterology  4. Renal lesion  - Ambulatory referral to Nephrology  5. Low testosterone  - Testosterone; Future  Follow up:  Follow up in 3 months

## 2022-11-20 ENCOUNTER — Other Ambulatory Visit: Payer: Self-pay | Admitting: Nurse Practitioner

## 2022-11-20 MED ORDER — RESTORA PO CAPS: 1.0000 | ORAL_CAPSULE | Freq: Every day | ORAL | 2 refills | Status: DC

## 2022-11-24 ENCOUNTER — Telehealth (HOSPITAL_COMMUNITY): Payer: Self-pay | Admitting: Licensed Clinical Social Worker

## 2022-11-24 NOTE — Telephone Encounter (Signed)
The therapist returns [name]'s call leaving a HIPAA-compliant voicemail. [name] left a voicemail for this therapist expressing an interest in attending [facility] IOP.  [name], MA, LCSW, [facility], LCAS 11/24/2022

## 2022-12-08 ENCOUNTER — Telehealth: Payer: Self-pay

## 2022-12-08 NOTE — Progress Notes (Signed)
  Care Coordination Note  12/08/2022 Name: [name] MRN: [id number] DOB: 10/07/1960  [name] is a 62 y.o. year old male who is a primary care patient of Ivonne Andrew, NP and is actively engaged with the Chronic Care Management team. I reached out to Vesta Mixer by phone today to assist with re-scheduling an initial visit with the Pharmacist  Follow up plan: Unsuccessful telephone outreach attempt made. A HIPAA compliant phone message was left for the patient providing contact information and requesting a return call.  The care management team will reach out to the patient again over the next 7 days.  If patient returns call to provider office, please advise to call CCM Care Guide Penne Lash  at [phone]  Penne Lash, RMA Care Guide [facility]  [place], [place] [address] Direct Dial: [phone] [email or web address]

## 2022-12-09 ENCOUNTER — Telehealth: Payer: Self-pay | Admitting: Nurse Practitioner

## 2022-12-09 ENCOUNTER — Telehealth: Payer: Self-pay

## 2022-12-09 ENCOUNTER — Telehealth (HOSPITAL_COMMUNITY): Payer: Self-pay | Admitting: Licensed Clinical Social Worker

## 2022-12-09 NOTE — Telephone Encounter (Signed)
[name] I just got a teams message from Dorinda Hill (a float CMA) stating that she just got off the phone with [name] and he was asking about his endo referral. You had placed this order back in September 2023. I had sent it to [facility], they responded requesting testosterone lab results - I unfortunately never got this message so nothing had been done since in regards to his endo referral.   Due to him calling and stating he is still interested in the endo referral, I am going to resend the referral to [facility] with his most recent testosterone results.   Just wanted to make you aware.

## 2022-12-09 NOTE — Transitions of Care (Post Inpatient/ED Visit) (Signed)
12/09/2022  Name: [name] MRN: [id number] DOB: 05/08/1961  Today's TOC FU Call Status: Today's TOC FU Call Status:: Successful TOC FU Call Competed TOC FU Call Complete Date: 12/09/22  Transition Care Management Follow-up Telephone Call Date of Discharge: 12/06/22 Discharge Facility: Other ([facility]) Name of Other ([facility]) Discharge Facility: [facility] Type of Discharge: Inpatient Admission How have you been since you were released from the hospital?: Better Any questions or concerns?: No  Items Reviewed: Did you receive and understand the discharge instructions provided?: Yes Medications obtained,verified, and reconciled?: Yes (Medications Reviewed) Any new allergies since your discharge?: No Dietary orders reviewed?: NA Do you have support at home?: No  Medications Reviewed Today: Medications Reviewed Today     Reviewed by Ivonne Andrew, NP (Nurse Practitioner) on 11/19/22 at [phone]  Med List Status: <None>   Medication Order Taking? Sig Documenting Provider Last Dose Status Informant  amLODipine (NORVASC) 10 MG tablet [id number] Yes Take 1 tablet by mouth once daily Hilty, Lisette Abu, MD Taking Active Self  aspirin 81 MG EC tablet [id number] Yes Take 1 tablet (81 mg total) by mouth daily. (May buy from over the counter): Swallow whole for heart health  Patient taking differently: Take 81 mg by mouth at bedtime. (May buy from over the counter): Swallow whole for heart health   Laubach, Philmore Pali, MD Taking Active Self  benzonatate (TESSALON) 100 MG capsule [id number] No Take 1 capsule (100 mg total) by mouth every 8 (eight) hours.  Patient not taking: Reported on 11/16/2022   Carlisle Beers, FNP Not Taking Active   Cholecalciferol 25 MCG (1000 UT) tablet [id number] Yes Take 5,000 Units by mouth every other day. [provider] Taking Active Self  clopidogrel (PLAVIX) 75 MG tablet [id number]  Take 75 mg by mouth daily. [provider]  Active  Self  dicyclomine (BENTYL) 20 MG tablet [id number] Yes Take 1 tablet (20 mg total) by mouth every 6 (six) hours as needed for up to 7 days for spasms. Trinna Post, MD Taking Expired 11/17/22 2359   EPINEPHrine 0.3 mg/0.3 mL IJ SOAJ injection [id number] Yes Inject 0.3 mg into the muscle as needed for anaphylaxis. Verlee Monte, MD Taking Active Self  ezetimibe (ZETIA) 10 MG tablet [id number] Yes Take 1 tablet by mouth once daily Ivonne Andrew, NP Taking Active   famotidine (PEPCID) 20 MG tablet [id number] Yes Take 1 tablet (20 mg total) by mouth 2 (two) times daily. Trinna Post, MD Taking Active   glucose blood (ACCU-CHEK GUIDE) test strip [id number] Yes USE 1  TO CHECK GLUCOSE THREE TIMES DAILY AS DIRECTED Ivonne Andrew, NP Taking Active   guaiFENesin (MUCINEX) 600 MG 12 hr tablet [id number] No Take 2 tablets (1,200 mg total) by mouth 2 (two) times daily.  Patient not taking: Reported on 11/16/2022   Carlisle Beers, FNP Not Taking Active   LIVALO 4 MG TABS [id number] Yes Take 1 tablet by mouth once daily Croitoru, Mihai, MD Taking Active Self  metFORMIN (GLUCOPHAGE-XR) 500 MG 24 hr tablet [id number] Yes Take 1 tablet (500 mg total) by mouth daily with breakfast. Ivonne Andrew, NP  Active   metoCLOPramide (REGLAN) 10 MG tablet [id number] Yes Take 1 tablet (10 mg total) by mouth 3 (three) times daily with meals. Merwyn Katos, MD Taking Active   metoprolol tartrate (LOPRESSOR) 100 MG tablet [id number] Yes Take 1 tablet (100 mg total) by mouth 2 (two) times daily. Chrystie Nose, MD Taking Active  Self  mirtazapine (REMERON) 15 MG tablet [id number] No Take 1 tablet (15 mg total) by mouth at bedtime.  Patient not taking: Reported on 11/16/2022   Park Pope, MD Not Taking Active   Multiple Vitamin (MULTIVITAMIN WITH MINERALS) TABS tablet [id number] Yes Take 1 tablet by mouth daily. Park Pope, MD Taking Active   nitroGLYCERIN (NITROSTAT) 0.4 MG SL tablet [id number] Yes Place 1 tablet (0.4 mg total)  under the tongue every 5 (five) minutes x 3 doses as needed for chest pain. Chrystie Nose, MD Taking Active Self  ondansetron (ZOFRAN-ODT) 4 MG disintegrating tablet [id number] Yes Take 1 tablet (4 mg total) by mouth every 8 (eight) hours as needed for nausea or vomiting. Willy Eddy, MD Taking Active   Polyethyl Glycol-Propyl Glycol (SYSTANE) 0.4-0.3 % GEL ophthalmic gel [id number] Yes Place 1 Application into both eyes daily as needed (For dry eyes). [provider] Taking Active Self  promethazine-dextromethorphan (PROMETHAZINE-DM) 6.25-15 MG/5ML syrup [id number] No Take 5 mLs by mouth at bedtime as needed for cough.  Patient not taking: Reported on 11/16/2022   Carlisle Beers, FNP Not Taking Active   Testosterone (ANDROGEL PUMP) 20.25 MG/ACT (1.62%) GEL [id number] Yes Place 1 Pump onto the skin daily. Ivonne Andrew, NP Taking Active Self  traZODone (DESYREL) 50 MG tablet [id number] Yes Take 1 tablet (50 mg total) by mouth at bedtime as needed for sleep. Park Pope, MD Taking Active             Home Care and Equipment/Supplies: Were Home Health Services Ordered?: No Any new equipment or medical supplies ordered?: No  Functional Questionnaire: Do you need assistance with bathing/showering or dressing?: No Do you need assistance with meal preparation?: No Do you need assistance with eating?: No Do you have difficulty maintaining continence: No Do you need assistance with getting out of bed/getting out of a chair/moving?: No Do you have difficulty managing or taking your medications?: No  Follow up appointments reviewed: PCP Follow-up appointment confirmed?: No MD Provider Line Number:[phone] Given: Yes Specialist Hospital Follow-up appointment confirmed?: NA Do you need transportation to your follow-up appointment?: No Do you understand care options if your condition(s) worsen?: Yes-patient verbalized understanding    SIGNATURE Fredirick Maudlin

## 2022-12-09 NOTE — Telephone Encounter (Signed)
The therapist returns [name]'s call confirming his identity via two identifiers. He says that he is interested in CD IOP. He has [name]. The therapist informs him that this agency is not taking [name] for CD IOP at this time due to non-payment of services; however, [name] is able to [facility] for outpatient therapy.  He is given the [name] customer service number to call for in-network providers who can take [name] presently. He says that he will call tomorrow and will call this therapist back p.r.n.  [address] MA, LCSW, [facility], LCAS 12/09/2022

## 2022-12-14 ENCOUNTER — Other Ambulatory Visit: Payer: Self-pay | Admitting: Internal Medicine

## 2022-12-15 ENCOUNTER — Other Ambulatory Visit: Payer: Medicaid Other | Admitting: Pharmacist

## 2022-12-16 ENCOUNTER — Other Ambulatory Visit: Payer: Medicaid Other

## 2022-12-16 NOTE — Progress Notes (Signed)
12/16/2022 Name: [name] MRN: [id number] DOB: [birth year]  Chief Complaint  Patient presents with   Medication Management    Diabetes    [name] is a 62 y.o. year old male who presented for a telephone visit.   They were referred to the pharmacist by their PCP for assistance in managing diabetes.    Subjective:  Care Team: Primary Care Provider: Ivonne Andrew, NP ; Next Scheduled Visit: 01/04/23  Medication Access/Adherence  Current Pharmacy:  [id number] - [place] ([place] - [address] [place] ([place]) [place] [address] Phone: [phone] Fax: [phone]   Patient reports affordability concerns with their medications: No  Patient reports access/transportation concerns to their pharmacy: No  Patient reports adherence concerns with their medications:  No     Diabetes:  Current medications: metformin 500 mg BID -Denies epigastric pain or worsening gastroparesis with metformin   Current glucose readings: -hasn't checked in the past week. Prior to that 90-100  Patient denies hypoglycemic s/sx including dizziness, shakiness, sweating. Patient denies hyperglycemic symptoms including polyuria, polydipsia, polyphagia, nocturia, neuropathy, blurred vision.  Current meal patterns:  - Has made drastic changes to his diet. - Prior to his visit with PCP he reports eating a junk food diet - chips, candy, soda - Now is eating a vegetarian diet, will eat meat occasionally.     Objective:  Lab Results  Component Value Date   HGBA1C 6.6 (H) 08/07/2022    Lab Results  Component Value Date   CREATININE 1.00 11/10/2022   BUN 13 11/10/2022   NA 138 11/10/2022   K 4.1 11/10/2022   CL 100 11/10/2022   CO2 29 11/10/2022    Lab Results  Component Value Date   CHOL 114 11/18/2022   HDL 35 (L) 11/18/2022   LDLCALC 56 11/18/2022   TRIG 130 11/18/2022   CHOLHDL 3.3 11/18/2022    Medications Reviewed Today      Reviewed by Ivonne Andrew, NP (Nurse Practitioner) on 11/19/22 at [phone]  Med List Status: <None>   Medication Order Taking? Sig Documenting Provider Last Dose Status Informant  amLODipine (NORVASC) 10 MG tablet [id number] Yes Take 1 tablet by mouth once daily Hilty, Lisette Abu, MD Taking Active Self  aspirin 81 MG EC tablet [id number] Yes Take 1 tablet (81 mg total) by mouth daily. (May buy from over the counter): Swallow whole for heart health  Patient taking differently: Take 81 mg by mouth at bedtime. (May buy from over the counter): Swallow whole for heart health   Laubach, Philmore Pali, MD Taking Active Self  benzonatate (TESSALON) 100 MG capsule [id number] No Take 1 capsule (100 mg total) by mouth every 8 (eight) hours.  Patient not taking: Reported on 11/16/2022   Carlisle Beers, FNP Not Taking Active   Cholecalciferol 25 MCG (1000 UT) tablet [id number] Yes Take 5,000 Units by mouth every other day. [provider] Taking Active Self  clopidogrel (PLAVIX) 75 MG tablet [id number]  Take 75 mg by mouth daily. [provider]  Active Self  dicyclomine (BENTYL) 20 MG tablet [id number] Yes Take 1 tablet (20 mg total) by mouth every 6 (six) hours as needed for up to 7 days for spasms. Trinna Post, MD Taking Expired 11/17/22 2359   EPINEPHrine 0.3 mg/0.3 mL IJ SOAJ injection [id number] Yes Inject 0.3 mg into the muscle as needed for anaphylaxis. Verlee Monte, MD Taking Active Self  ezetimibe (ZETIA) 10 MG tablet  [id number] Yes Take 1 tablet by mouth once daily Ivonne Andrew, NP Taking Active   famotidine (PEPCID) 20 MG tablet [id number] Yes Take 1 tablet (20 mg total) by mouth 2 (two) times daily. Trinna Post, MD Taking Active   glucose blood (ACCU-CHEK GUIDE) test strip [id number] Yes USE 1  TO CHECK GLUCOSE THREE TIMES DAILY AS DIRECTED Ivonne Andrew, NP Taking Active   guaiFENesin (MUCINEX) 600 MG 12 hr tablet [id number] No Take 2 tablets (1,200 mg total) by mouth 2 (two) times  daily.  Patient not taking: Reported on 11/16/2022   Carlisle Beers, FNP Not Taking Active   LIVALO 4 MG TABS [id number] Yes Take 1 tablet by mouth once daily Croitoru, Mihai, MD Taking Active Self  metFORMIN (GLUCOPHAGE-XR) 500 MG 24 hr tablet [id number] Yes Take 1 tablet (500 mg total) by mouth daily with breakfast. Ivonne Andrew, NP  Active   metoCLOPramide (REGLAN) 10 MG tablet [id number] Yes Take 1 tablet (10 mg total) by mouth 3 (three) times daily with meals. Merwyn Katos, MD Taking Active   metoprolol tartrate (LOPRESSOR) 100 MG tablet [id number] Yes Take 1 tablet (100 mg total) by mouth 2 (two) times daily. Chrystie Nose, MD Taking Active Self  mirtazapine (REMERON) 15 MG tablet [id number] No Take 1 tablet (15 mg total) by mouth at bedtime.  Patient not taking: Reported on 11/16/2022   Park Pope, MD Not Taking Active   Multiple Vitamin (MULTIVITAMIN WITH MINERALS) TABS tablet [id number] Yes Take 1 tablet by mouth daily. Park Pope, MD Taking Active   nitroGLYCERIN (NITROSTAT) 0.4 MG SL tablet [id number] Yes Place 1 tablet (0.4 mg total) under the tongue every 5 (five) minutes x 3 doses as needed for chest pain. Chrystie Nose, MD Taking Active Self  ondansetron (ZOFRAN-ODT) 4 MG disintegrating tablet [id number] Yes Take 1 tablet (4 mg total) by mouth every 8 (eight) hours as needed for nausea or vomiting. Willy Eddy, MD Taking Active   Polyethyl Glycol-Propyl Glycol (SYSTANE) 0.4-0.3 % GEL ophthalmic gel [id number] Yes Place 1 Application into both eyes daily as needed (For dry eyes). [provider] Taking Active Self  promethazine-dextromethorphan (PROMETHAZINE-DM) 6.25-15 MG/5ML syrup [id number] No Take 5 mLs by mouth at bedtime as needed for cough.  Patient not taking: Reported on 11/16/2022   Carlisle Beers, FNP Not Taking Active   Testosterone (ANDROGEL PUMP) 20.25 MG/ACT (1.62%) GEL [id number] Yes Place 1 Pump onto the skin daily. Ivonne Andrew, NP  Taking Active Self  traZODone (DESYREL) 50 MG tablet [id number] Yes Take 1 tablet (50 mg total) by mouth at bedtime as needed for sleep. Park Pope, MD Taking Active               Assessment/Plan:   Diabetes: - Currently slightly above goal based on A1c (7.3%).  - Reviewed long term cardiovascular and renal outcomes of uncontrolled blood sugar - Reviewed goal A1c, goal fasting, and goal 2 hour post prandial glucose - Recommend to continue metformin 500 mg BID - Patient has endo referral in process.   Follow Up Plan:  Pharmacist: PRN PCP: January 04 2023  Valeda Malm, Pharm.D. PGY-2 Ambulatory Care Pharmacy Resident

## 2022-12-21 ENCOUNTER — Telehealth: Payer: Self-pay

## 2022-12-21 ENCOUNTER — Other Ambulatory Visit: Payer: Self-pay

## 2022-12-21 NOTE — Telephone Encounter (Signed)
Submitted prior authorization today via [employer] confirmation # [id number]. Approved until 12/21/2023

## 2022-12-21 NOTE — Telephone Encounter (Signed)
Pt called and advised that his livalo needs a PA. Please advise [facility]

## 2022-12-24 ENCOUNTER — Telehealth: Payer: Self-pay | Admitting: Nurse Practitioner

## 2022-12-25 NOTE — Telephone Encounter (Signed)
Error

## 2023-01-04 ENCOUNTER — Ambulatory Visit: Payer: Self-pay | Admitting: Nurse Practitioner

## 2023-01-04 ENCOUNTER — Other Ambulatory Visit: Payer: Self-pay | Admitting: Nurse Practitioner

## 2023-01-15 NOTE — Progress Notes (Unsigned)
01/18/2023 [name] [id number] [birth year]  Referring provider: Ivonne Andrew, NP Primary GI doctor: Dr. Lavon Paganini ( Dr. Arlyce Dice)   ASSESSMENT AND PLAN:   62 year old male with history of polysubstance abuse including alcohol and cocaine, type 2 diabetes, presents with nausea, vomiting, diarrhea and abdominal pain after binge drinking.   Unremarkable CT abdomen pelvis 10/2022  Previous normal colonoscopy 12/2012   Symptoms do appear to coincide with binge drinking alcohol, Long discussion with patient about alcohol/substance abuse, continue NA/AA, discussed with primary care, consider inpatient/outpatient program.   Patient declines refill of Reglan/dicyclomine states he does not need medication at this time. Will send in Protonix 40 mg once daily for possible alcohol/cocaine induced gastritis, esophagitis, peptic ulcer disease. Will plan for EGD and colonoscopy at [facility] with Dr. Lavon Paganini, long discussion about if patient uses cocaine or alcohol will need to cancel the procedure, discussed risks including death.  Patient agrees. May also have some gastroparesis involved with longstanding history of diabetes, given gastroparesis diet, GERD information.  CAD S/P percutaneous coronary angioplasty Hold Plavix for 5 days before procedure  will instruct when and how to resume after procedure.  Patient understands that there is a low but real risk of cardiovascular event such as heart attack, stroke, or embolism /  thrombosis, or ischemia while off Coumadin.  The patient consents to proceed.  Will communicate by phone or EMR with patient's prescribing provider to confirm that holding Coumadin is reasonable in this case.   Type 2 diabetes mellitus with other circulatory complication, without long-term current use of insulin ([name]) Last A1c 7.3, possible gastroparesis associated with symptoms though most likely related alcohol use.  Counseled  History of substance abuse ([name]) and alcohol  dependence Suggest follow-up with primary care, has follow-up on 24th or 25th, discuss other options for polysubstance abuse. -Alcohol Abstinence counseling discussed with patient as continued use is strongly associated with worsening liver disease progression - get on MVIT - resources given, discuss with PCP -No elevation of LFTs, no evidence of cirrhosis  Renal cyst Seen on CT abdomen pelvis in the ER, has follow-up with primary care, they suggest follow-up MRI with and without.  Patient Care Team: Ivonne Andrew, NP as PCP - General (Pulmonary Disease) Chrystie Nose, MD as PCP - Cardiology (Cardiology)  HISTORY OF PRESENT ILLNESS: 62 y.o. male with a past medical history of  CAD s/p stent, DM2 A1C 7.3, and others listed below presents for evaluation of epigastric pain/diarrhea.   colonoscopy 12/2012 Diminutive sigmoid hyperplastic polyp removed  recall 12/2022 2021 Cath for Chest pain showed severe single vessle CAD diffuse in stent restenosis Lcx/OM2, treated medically., Echo with normal EF, no AS. 11/06/2022 CT AB and pelvis with contrast for Ab pain showed no acute findings. Indeterminate lesion right kidney, need MRI 12/05/2022 CBC without anemia, no infection, normal Kidney/liver.   He was sober for 4 years, he use to drink liquor but stopped, but over the last year tried wine and would start binge drinking on wine. He would drink 2 bottle of wine a day for 1-2 days and then not drink for another month.  He states in May he had another binge of 2 bottles of wine and that evening he had nausea, vomiting, epigastric AB pain and diarrhea.  Since may he has had 2 episodes that are similar after drinking, he is watching what he eats. He normally has 1 BM a day unless you eat junk food, fatty food or drinking  will have diarrhea.  He has GERD outside of the episodes. Worse at night. He will eat and lay down occ with GERD into his mouth.  Denies dysphagia.  Denies melena or  hematochezia.  He was given reglan and dicyclomine in May which helped but states he ran out. He has not been on GERD medication  He follows with Dr. Rennis Golden, he exercises without CP or SOB.  He reports blood thinner use, on Plavix, Dr. Rennis Golden prescribes. He denies NSAID use.  He reports ETOH use, was drinking 6 pack once a week but has not had any in a month because it caused nausea,AB pain and vomiting.   He denies tobacco use but does admit to occ cig with ETOH.  He started cocaine use age of 35 and has been persistent until 2015 he has been in and out of recovery. He last used cocaine 6 months ago.   He does do programs.   He  reports that he quit smoking about 4 years ago. His smoking use included cigarettes. He started smoking about 14 years ago. He has a 10 pack-year smoking history. He has never used smokeless tobacco. He reports that he does not currently use alcohol. He reports that he does not currently use drugs after having used the following drugs: Cocaine.  RELEVANT LABS AND IMAGING: CBC    Component Value Date/Time   WBC 6.1 11/10/2022 0928   RBC 5.64 11/10/2022 0928   HGB 15.7 11/10/2022 0928   HGB 14.3 12/04/2021 0850   HCT 49.2 11/10/2022 0928   HCT 43.0 12/04/2021 0850   PLT 209 11/10/2022 0928   PLT 240 12/04/2021 0850   MCV 87.2 11/10/2022 0928   MCV 85 12/04/2021 0850   MCH 27.8 11/10/2022 0928   MCHC 31.9 11/10/2022 0928   RDW 12.4 11/10/2022 0928   RDW 12.8 12/04/2021 0850   LYMPHSABS 0.7 11/07/2022 0240   LYMPHSABS 1.9 10/31/2020 1230   MONOABS 0.4 11/07/2022 0240   EOSABS 0.1 11/07/2022 0240   EOSABS 0.3 10/31/2020 1230   BASOSABS 0.0 11/07/2022 0240   BASOSABS 0.1 10/31/2020 1230   Recent Labs    05/16/22 1333 08/07/22 1619 11/06/22 0917 11/07/22 0240 11/10/22 0928  HGB 15.4 14.3 14.8 14.7 15.7    CMP     Component Value Date/Time   NA 138 11/10/2022 0928   NA 140 12/04/2021 0850   K 4.1 11/10/2022 0928   CL 100 11/10/2022 0928   CO2 29  11/10/2022 0928   GLUCOSE 243 (H) 11/10/2022 0928   BUN 13 11/10/2022 0928   BUN 14 12/04/2021 0850   CREATININE 1.00 11/10/2022 0928   CREATININE 0.87 12/08/2016 0900   CALCIUM 9.2 11/10/2022 0928   PROT 6.8 11/10/2022 0928   PROT 7.0 12/04/2021 0850   ALBUMIN 4.1 11/10/2022 0928   ALBUMIN 4.7 12/04/2021 0850   AST 23 11/10/2022 0928   ALT 38 11/10/2022 0928   ALKPHOS 69 11/10/2022 0928   BILITOT 0.4 11/10/2022 0928   BILITOT 0.3 12/04/2021 0850   GFRNONAA >60 11/10/2022 0928   GFRNONAA >89 12/08/2016 0900   GFRAA >60 03/24/2020 0750   GFRAA >89 12/08/2016 0900      Latest Ref Rng & Units 11/10/2022    9:28 AM 11/07/2022    2:39 AM 11/06/2022    9:17 AM  Hepatic Function  Total Protein 6.5 - 8.1 g/dL 6.8  6.3  6.8   Albumin 3.5 - 5.0 g/dL 4.1  3.8  4.3  AST 15 - 41 U/L 23  21  27    ALT 0 - 44 U/L 38  38  48   Alk Phosphatase 38 - 126 U/L 69  65  61   Total Bilirubin 0.3 - 1.2 mg/dL 0.4  0.4  0.6       Current Medications:   Current Outpatient Medications (Endocrine & Metabolic):    metFORMIN (GLUCOPHAGE-XR) 500 MG 24 hr tablet, Take 1 tablet (500 mg total) by mouth daily with breakfast. (Patient taking differently: Take 500 mg by mouth 2 (two) times daily with a meal.)   Testosterone (ANDROGEL PUMP) 20.25 MG/ACT (1.62%) GEL, Place 1 Pump onto the skin daily.  Current Outpatient Medications (Cardiovascular):    amLODipine (NORVASC) 10 MG tablet, Take 1 tablet by mouth once daily   EPINEPHrine 0.3 mg/0.3 mL IJ SOAJ injection, Inject 0.3 mg into the muscle as needed for anaphylaxis.   LIVALO 4 MG TABS, Take 1 tablet by mouth once daily   metoprolol tartrate (LOPRESSOR) 100 MG tablet, Take 1 tablet (100 mg total) by mouth 2 (two) times daily. NEED OV.   nitroGLYCERIN (NITROSTAT) 0.4 MG SL tablet, Place 1 tablet (0.4 mg total) under the tongue every 5 (five) minutes x 3 doses as needed for chest pain.   ezetimibe (ZETIA) 10 MG tablet, Take 1 tablet by mouth once daily  (Patient not taking: Reported on 01/18/2023)   Current Outpatient Medications (Analgesics):    aspirin 81 MG EC tablet, Take 1 tablet (81 mg total) by mouth daily. (May buy from over the counter): Swallow whole for heart health (Patient taking differently: Take 81 mg by mouth at bedtime. (May buy from over the counter): Swallow whole for heart health)  Current Outpatient Medications (Hematological):    clopidogrel (PLAVIX) 75 MG tablet, Take 75 mg by mouth daily.  Current Outpatient Medications (Other):    Cholecalciferol 25 MCG (1000 UT) tablet, Take 5,000 Units by mouth every other day.   glucose blood (ACCU-CHEK GUIDE) test strip, USE 1 STRIP TO CHECK GLUCOSE THREE TIMES DAILY AS DIRECTED   mirtazapine (REMERON) 15 MG tablet, Take 1 tablet (15 mg total) by mouth at bedtime.   Multiple Vitamin (MULTIVITAMIN WITH MINERALS) TABS tablet, Take 1 tablet by mouth daily.   pantoprazole (PROTONIX) 40 MG tablet, Take 1 tablet (40 mg total) by mouth daily.   Polyethyl Glycol-Propyl Glycol (SYSTANE) 0.4-0.3 % GEL ophthalmic gel, Place 1 Application into both eyes daily as needed (For dry eyes).   traZODone (DESYREL) 50 MG tablet, Take 1 tablet (50 mg total) by mouth at bedtime as needed for sleep.   dicyclomine (BENTYL) 20 MG tablet, Take 1 tablet (20 mg total) by mouth every 6 (six) hours as needed for up to 7 days for spasms.   famotidine (PEPCID) 20 MG tablet, Take 1 tablet (20 mg total) by mouth 2 (two) times daily.   metoCLOPramide (REGLAN) 10 MG tablet, Take 1 tablet (10 mg total) by mouth 3 (three) times daily with meals. (Patient not taking: Reported on 01/18/2023)   ondansetron (ZOFRAN-ODT) 4 MG disintegrating tablet, Take 1 tablet (4 mg total) by mouth every 8 (eight) hours as needed for nausea or vomiting. (Patient not taking: Reported on 01/18/2023)   Probiotic Product (RESTORA) CAPS, Take 1 capsule by mouth daily. (Patient not taking: Reported on 01/18/2023)  Medical History:  Past Medical  History:  Diagnosis Date   Alcohol dependence ([name])    Allergies    Arthritis    Chest pain  Chronic lower back pain    Chronic pain of right wrist    Coronary artery disease    a. Multiple prior caths/PCI. Cath 2013 with possible spasm of RCA, 70% ISR of mid LCx with subsequent DES to mLCx and prox LCX. b. H/o microvascular angina. c. Recurrent angina 08/2014 - s/p PTCA/DES to prox Cx, PTCA/CBA to OM1.  c. LHC 06/10/15 with patent stents and some ISR in LCX and OM-1 that was not flow limiting --> Rx    Dyslipidemia    a. Intolerant to many statins except tolerating Livalo.   GERD (gastroesophageal reflux disease)    H/O cardiac catheterization 10/25/2018   Heart attack [facility])    Hypertension    Myocardial infarction [facility]) ~ 2010   S/P angioplasty with stent, DES, to proximal and mid LCX 12/15/11 12/15/2011   S/P foot surgery, right 04/2021   Shoulder pain    Stroke ([name])    pt. reports had a stroke around time of MI 2010   Type II diabetes mellitus ([name])    Unstable angina ([name])    Allergies:  Allergies  Allergen Reactions   Bee Venom Anaphylaxis, Hives, Itching and Other (See Comments)    Red eyes   Gadolinium Hives, Itching, Swelling and Other (See Comments)    Swelling of eyes after receiving MR contrast. 13 hr prep recommended. Pt developed large hive and swelling under right eye after contrast, given 50mg  iv benadryl, pt will need premed before gadolinium//lh   Shellfish Allergy Anaphylaxis, Hives, Itching and Other (See Comments)    Red eyes   Statins Other (See Comments)    Myalgias. Tolerating livalo. Pain    Testosterone Cypionate Other (See Comments)    Testerone Injection --Increased breast tissue      Surgical History:  He  has a past surgical history that includes Excisional hemorrhoidectomy (1984); Lipoma excision; cardiometabolic testing (08/08/2012); NM MYOCAR PERF WALL MOTION (02/2012); Cardiac catheterization (06/15/2002); Cardiac catheterization (04/01/2010);  Cardiac catheterization (08/25/2010); Cardiac catheterization (01/26/2011); Cardiac catheterization (05/11/2011); Cardiac catheterization (12/15/2011); left heart catheterization with coronary angiogram (N/A, 05/11/2011); left heart catheterization with coronary angiogram (N/A, 12/15/2011); left heart catheterization with coronary angiogram (N/A, 09/05/2014); percutaneous coronary stent intervention (pci-s) (09/05/2014); Cardiac catheterization (N/A, 06/10/2015); Colonoscopy (12/2012); Cardiac catheterization (10/25/2018); LEFT HEART CATH AND CORONARY ANGIOGRAPHY (N/A, 10/25/2018); CORONARY BALLOON ANGIOPLASTY (N/A, 10/25/2018); LEFT HEART CATH AND CORONARY ANGIOGRAPHY (N/A, 09/29/2019); Coronary Ultrasound/IVUS (N/A, 09/29/2019); CORONARY BALLOON ANGIOPLASTY (N/A, 09/29/2019); and Tonsillectomy. Family History:  His family history includes Cancer in his brother and paternal grandfather; Coronary artery disease in his paternal grandmother; Leukemia in his mother; Prostate cancer in his father.  REVIEW OF SYSTEMS  : All other systems reviewed and negative except where noted in the History of Present Illness.  PHYSICAL EXAM: BP 124/76 (BP Location: Left Arm, Patient Position: Standing, Cuff Size: Normal)   Pulse 64   Ht 5\' 8"  (1.727 m)   Wt 191 lb 8 oz (86.9 kg)   SpO2 98%   BMI 29.12 kg/m  General Appearance: Well nourished, in no apparent distress. Head:   Normocephalic and atraumatic. Eyes:  sclerae anicteric,conjunctive pink  Respiratory: Respiratory effort normal, BS equal bilaterally without rales, rhonchi, wheezing. Cardio: RRR with no MRGs. Peripheral pulses intact.  Abdomen: Soft,  Obese ,active bowel sounds. mild tenderness in the epigastrium. Without guarding and Without rebound. No masses. Rectal: Not evaluated Musculoskeletal: Full ROM, Normal gait. Without edema. Skin: Patient with dark velvet colored patches on back, along right upper abdomen circular dark  pattern, possible tinea versicolor versus  corpus.  Has follow-up with PCP. Neuro: Alert and  oriented x4;  No focal deficits. Psych:  Cooperative. Normal mood and affect.    Doree Albee, PA-C 11:58 AM

## 2023-01-18 ENCOUNTER — Encounter: Payer: Self-pay | Admitting: Physician Assistant

## 2023-01-18 ENCOUNTER — Ambulatory Visit (INDEPENDENT_AMBULATORY_CARE_PROVIDER_SITE_OTHER): Payer: MEDICAID | Admitting: Physician Assistant

## 2023-01-18 VITALS — BP 124/76 | HR 64 | Ht 68.0 in | Wt 191.5 lb

## 2023-01-18 DIAGNOSIS — R112 Nausea with vomiting, unspecified: Secondary | ICD-10-CM | POA: Diagnosis not present

## 2023-01-18 DIAGNOSIS — F1024 Alcohol dependence with alcohol-induced mood disorder: Secondary | ICD-10-CM | POA: Diagnosis not present

## 2023-01-18 DIAGNOSIS — N281 Cyst of kidney, acquired: Secondary | ICD-10-CM

## 2023-01-18 DIAGNOSIS — E1159 Type 2 diabetes mellitus with other circulatory complications: Secondary | ICD-10-CM

## 2023-01-18 DIAGNOSIS — Z9861 Coronary angioplasty status: Secondary | ICD-10-CM

## 2023-01-18 DIAGNOSIS — F1911 Other psychoactive substance abuse, in remission: Secondary | ICD-10-CM

## 2023-01-18 DIAGNOSIS — R197 Diarrhea, unspecified: Secondary | ICD-10-CM | POA: Diagnosis not present

## 2023-01-18 DIAGNOSIS — K219 Gastro-esophageal reflux disease without esophagitis: Secondary | ICD-10-CM

## 2023-01-18 DIAGNOSIS — I2510 Atherosclerotic heart disease of native coronary artery without angina pectoris: Secondary | ICD-10-CM

## 2023-01-18 MED ORDER — PANTOPRAZOLE SODIUM 40 MG PO TBEC
40.0000 mg | DELAYED_RELEASE_TABLET | Freq: Every day | ORAL | 1 refills | Status: DC
Start: 2023-01-18 — End: 2023-02-15

## 2023-01-18 MED ORDER — NA SULFATE-K SULFATE-MG SULF 17.5-3.13-1.6 GM/177ML PO SOLN: 1.0000 | Freq: Once | ORAL | 0 refills | Status: AC

## 2023-01-18 NOTE — Patient Instructions (Addendum)
.You have been scheduled for a colonoscopy. Please follow written instructions given to you at your visit today.   Please pick up your prep supplies at the pharmacy within the next 1-3 days.  If you use inhalers (even only as needed), please bring them with you on the day of your procedure.  DO NOT TAKE 7 DAYS PRIOR TO TEST- Trulicity (dulaglutide) Ozempic, Wegovy (semaglutide) Mounjaro (tirzepatide) Bydureon Bcise (exanatide extended release)  DO NOT TAKE 1 DAY PRIOR TO YOUR TEST Rybelsus (semaglutide) Adlyxin (lixisenatide) Victoza (liraglutide) Byetta (exanatide) ___________________________________________________________________________  We have sent the following medications to your pharmacy for you to pick up at your convenience: Protonix   Please take your proton pump inhibitor medication, protonix 40 mg daily  Please take this medication 30 minutes to 1 hour before meals- this makes it more effective.  Avoid spicy and acidic foods Avoid fatty foods Limit your intake of coffee, tea, alcohol, and carbonated drinks Work to maintain a healthy weight Keep the head of the bed elevated at least 3 inches with blocks or a wedge pillow if you are having any nighttime symptoms Stay upright for 2 hours after eating Avoid meals and snacks three to four hours before bedtime  STOP ALCOHOL  First do a trial off milk/lactose products if you use them.  Add fiber like benefiber or citracel once a day Increase activity Can do trial of IBGard which is over the counter for AB pain- Take 1-2 capsules once a day for maintence or twice a day during a flare Please try to decrease stress. consider talking with PCP about anti anxiety medication or try head space app for meditation. if any worsening symptoms like blood in stool, weight loss, please call the office   Gastroparesis Please do small frequent meals like 4-6 meals a day.  Eat and drink liquids at separate times.  Avoid high fiber  foods, cook your vegetables, avoid high fat food.  Suggest spreading protein throughout the day (greek yogurt, glucerna, soft meat, milk, eggs) Choose soft foods that you can mash with a fork When you are more symptomatic, change to pureed foods foods and liquids.  Consider reading "Living well with Gastroparesis" by Reuel Derby Gastroparesis is a condition in which food takes longer than normal to empty from the stomach. This condition is also known as delayed gastric emptying. It is usually a long-term (chronic) condition. There is no cure, but there are treatments and things that you can do at home to help relieve symptoms. Treating the underlying condition that causes gastroparesis can also help relieve symptoms What are the causes? In many cases, the cause of this condition is not known. Possible causes include: A hormone (endocrine) disorder, such as hypothyroidism or diabetes. A nervous system disease, such as Parkinson's disease or multiple sclerosis. Cancer, infection, or surgery that affects the stomach or vagus nerve. The vagus nerve runs from your chest, through your neck, and to the lower part of your brain. A connective tissue disorder, such as scleroderma. Certain medicines. What increases the risk? You are more likely to develop this condition if: You have certain disorders or diseases. These may include: An endocrine disorder. An eating disorder. Amyloidosis. Scleroderma. Parkinson's disease. Multiple sclerosis. Cancer or infection of the stomach or the vagus nerve. You have had surgery on your stomach or vagus nerve. You take certain medicines. You are male. What are the signs or symptoms? Symptoms of this condition include: Feeling full after eating very little or a loss of  appetite. Nausea, vomiting, or heartburn. Bloating of your abdomen. Inconsistent blood sugar (glucose) levels on blood tests. Unexplained weight loss. Acid from the stomach coming up  into the esophagus (gastroesophageal reflux). Sudden tightening (spasm) of the stomach, which can be painful. Symptoms may come and go. Some people may not notice any symptoms. How is this diagnosed? This condition is diagnosed with tests, such as: Tests that check how long it takes food to move through the stomach and intestines. These tests include: Upper gastrointestinal (GI) series. For this test, you drink a liquid that shows up well on X-rays, and then X-rays are taken of your intestines. Gastric emptying scintigraphy. For this test, you eat food that contains a small amount of radioactive material, and then scans are taken. Wireless capsule GI monitoring system. For this test, you swallow a pill (capsule) that records information about how foods and fluid move through your stomach. Gastric manometry. For this test, a tube is passed down your throat and into your stomach to measure electrical and muscular activity. Endoscopy. For this test, a long, thin tube with a camera and light on the end is passed down your throat and into your stomach to check for problems in your stomach lining. Ultrasound. This test uses sound waves to create images of the inside of your body. This can help rule out gallbladder disease or pancreatitis as a cause of your symptoms. How is this treated? There is no cure for this condition, but treatment and home care may relieve symptoms. Treatment may include: Treating the underlying cause. Managing your symptoms by making changes to your diet and exercise habits. Taking medicines to control nausea and vomiting and to stimulate stomach muscles. Getting food through a feeding tube in the hospital. This may be done in severe cases. Having surgery to insert a device called a gastric electrical stimulator into your body. This device helps improve stomach emptying and control nausea and vomiting. Follow these instructions at home: Take over-the-counter and prescription  medicines only as told by your health care provider. Follow instructions from your health care provider about eating or drinking restrictions. Your health care provider may recommend that you: Eat smaller meals more often. Eat low-fat foods. Eat low-fiber forms of high-fiber foods. For example, eat cooked vegetables instead of raw vegetables. Have only liquid foods instead of solid foods. Liquid foods are easier to digest. Drink enough fluid to keep your urine pale yellow. Exercise as often as told by your health care provider. Keep all follow-up visits. This is important. Contact a health care provider if you: Notice that your symptoms do not improve with treatment. Have new symptoms. Get help right away if you: Have severe pain in your abdomen that does not improve with treatment. Have nausea that is severe or does not go away. Vomit every time you drink fluids. Summary Gastroparesis is a long-term (chronic) condition in which food takes longer than normal to empty from the stomach. Symptoms include nausea, vomiting, heartburn, bloating of your abdomen, and loss of appetite. Eating smaller portions, low-fat foods, and low-fiber forms of high-fiber foods may help you manage your symptoms. Get help right away if you have severe pain in your abdomen. This information is not intended to replace advice given to you by your health care provider. Make sure you discuss any questions you have with your health care provider. Document Revised: 10/23/2019 Document Reviewed: 10/23/2019 Elsevier Patient Education  2021 [employer]     FODMAP stands for fermentable oligo-, di-,  mono-saccharides and polyols (1). These are the scientific terms used to classify groups of carbs that are difficult for our body to digest and that are notorious for triggering digestive symptoms like bloating, gas, loose stools and stomach pain.   You can try low FODMAP diet  - start with eliminating just one column at  a time that you feel may be a trigger for you. - the table at the very bottom contains foods that are low in FODMAPs   Sometimes trying to eliminate the FODMAP's from your diet is difficult or tricky, if you are stuggling with trying to do the elimination diet you can try an enzyme.  There is a food enzymes that you sprinkle in or on your food that helps break down the FODMAP. You can read more about the enzyme by going to this site: https://fodzyme.com/      Tinea Versicolor  Tinea versicolor is a skin infection. It is caused by a type of yeast. It is normal for some yeast to be on your skin, but too much yeast causes this infection. The infection causes a rash of light or dark patches on your skin. The rash is most common on the chest, back, neck, or upper arms. The infection usually does not cause other problems. If it is treated, it will probably go away in a few weeks. The infection cannot be spread from one person to another (is notcontagious). What are the causes? This condition is caused by a certain type of yeast that starts to grow too much on your skin. What increases the risk? Heat and humidity. Sweating too much. Hormone changes. This may happen when taking birth control pills. Oily skin. A weak disease-fighting system (immunesystem). What are the signs or symptoms? A rash of light or dark patches on your skin. The rash may have: Patches of tan or pink spots (on light skin). Patches of white or brown spots (on dark skin). Patches of skin that do not tan. Well-marked edges. Scales. Mild itching. There may also be no itching. How is this treated? Treatment for this condition may include: Dandruff shampoo. The shampoo may be used on the affected skin during showers or baths. Over-the-counter medicated skin cream, lotion, or soaps. Prescription antifungal medicine. This may include cream or pills. Medicine to help your itching. Follow these instructions at home: Use  over-the-counter and prescription medicines only as told by your doctor. Wash your skin with dandruff shampoo as told by your doctor. Do not scratch your skin in the rash area. Avoid places that are hot and humid. Do not use tanning booths. Try to avoid sweating a lot. Contact a doctor if: Your symptoms get worse. You have a fever. You have signs of infection such as: Redness, swelling, or pain in the rash area. Warmth coming from your rash. Fluid or blood coming from your rash. Pus or a bad smell coming from your rash. Your rash comes back (recurs) after treatment. Your rash does not improve with treatment. Your rash spreads to other parts of the body. Summary Tinea versicolor is a skin infection. It causes a rash of light or dark patches on your skin. The rash is most common on the chest, back, neck, or upper arms. This infection usually does not cause other problems. Use over-the-counter and prescription medicines only as told by your doctor. If the infection is treated, it will probably go away in a few weeks. This information is not intended to replace advice given to you by  your health care provider. Make sure you discuss any questions you have with your health care provider. Document Revised: 09/03/2020 Document Reviewed: 09/03/2020 Elsevier Patient Education  2024 [employer].

## 2023-01-20 ENCOUNTER — Telehealth: Payer: Self-pay

## 2023-01-20 NOTE — Telephone Encounter (Signed)
[facility] Pre-operative Risk Assessment     Request for surgical clearance:     Endoscopy Procedure  What type of surgery is being performed?     Colonoscopy/EGD  When is this surgery scheduled?     02/24/2023  What type of clearance is required ?   Pharmacy  Are there any medications that need to be held prior to surgery and how long? PLAVIX-5 DAYS  Practice name and name of physician performing surgery?      [facility]  What is your office phone and fax number?      Phone- [phone]  Fax- [phone]  Anesthesia type (None, local, MAC, general) ?       MAC

## 2023-01-20 NOTE — Telephone Encounter (Signed)
   Name: [name]  DOB: December 24, 1960  MRN: [id number]  Primary Cardiologist: Chrystie Nose, MD  Chart reviewed as part of pre-operative protocol coverage. Because of [name]'s past medical history and time since last visit, he will require a follow-up in-office visit in order to better assess preoperative cardiovascular risk.  Pre-op covering staff: - Please schedule appointment and call patient to inform them. If patient already had an upcoming appointment within acceptable timeframe, please add "pre-op clearance" to the appointment notes so provider is aware. - Please contact requesting surgeon's office via preferred method (i.e, phone, fax) to inform them of need for appointment prior to surgery.   Per office protocol, if patient is without any new symptoms or concerns at the time of their virtual visit, he/she may hold ASA and Plavix for 5-7 days days prior to procedure. Please resume ASA and Plavix as soon as possible postprocedure,at the discretion of the surgeon.    Joni Reining, NP  01/20/2023, 2:16 PM

## 2023-01-20 NOTE — Telephone Encounter (Signed)
Pt has been scheduled to see Azalee Course, PA-C 02/03/23 8:50, clearance will be addressed at that time.  Will route back to the requesting surgeon's office to make them aware.

## 2023-01-21 ENCOUNTER — Ambulatory Visit: Payer: MEDICAID | Admitting: Nurse Practitioner

## 2023-01-29 ENCOUNTER — Other Ambulatory Visit: Payer: Self-pay | Admitting: Internal Medicine

## 2023-01-29 ENCOUNTER — Other Ambulatory Visit: Payer: Self-pay | Admitting: Nurse Practitioner

## 2023-01-29 DIAGNOSIS — E785 Hyperlipidemia, unspecified: Secondary | ICD-10-CM

## 2023-01-29 NOTE — Telephone Encounter (Signed)
Please advise K[facility] 

## 2023-02-03 ENCOUNTER — Ambulatory Visit (HOSPITAL_COMMUNITY): Payer: Self-pay

## 2023-02-03 ENCOUNTER — Ambulatory Visit: Payer: MEDICAID | Admitting: Physician Assistant

## 2023-02-15 ENCOUNTER — Ambulatory Visit (INDEPENDENT_AMBULATORY_CARE_PROVIDER_SITE_OTHER): Payer: MEDICAID | Admitting: Nurse Practitioner

## 2023-02-15 ENCOUNTER — Encounter: Payer: Self-pay | Admitting: Nurse Practitioner

## 2023-02-15 ENCOUNTER — Other Ambulatory Visit (HOSPITAL_COMMUNITY)
Admission: EM | Admit: 2023-02-15 | Discharge: 2023-02-19 | Disposition: A | Discharge status: Home / Self Care | Payer: MEDICAID | Attending: Psychiatry | Admitting: Psychiatry

## 2023-02-15 ENCOUNTER — Ambulatory Visit (HOSPITAL_COMMUNITY)
Admission: EM | Admit: 2023-02-15 | Discharge: 2023-02-15 | Disposition: A | Discharge status: Home / Self Care | Payer: MEDICAID | Attending: Psychiatry | Admitting: Psychiatry

## 2023-02-15 VITALS — BP 133/80 | HR 60 | Temp 97.1°F | Wt 194.4 lb

## 2023-02-15 DIAGNOSIS — F339 Major depressive disorder, recurrent, unspecified: Secondary | ICD-10-CM | POA: Insufficient documentation

## 2023-02-15 DIAGNOSIS — N289 Disorder of kidney and ureter, unspecified: Secondary | ICD-10-CM

## 2023-02-15 DIAGNOSIS — F1010 Alcohol abuse, uncomplicated: Secondary | ICD-10-CM

## 2023-02-15 DIAGNOSIS — F419 Anxiety disorder, unspecified: Secondary | ICD-10-CM

## 2023-02-15 DIAGNOSIS — E785 Hyperlipidemia, unspecified: Secondary | ICD-10-CM | POA: Diagnosis not present

## 2023-02-15 DIAGNOSIS — F411 Generalized anxiety disorder: Secondary | ICD-10-CM | POA: Insufficient documentation

## 2023-02-15 DIAGNOSIS — E1159 Type 2 diabetes mellitus with other circulatory complications: Secondary | ICD-10-CM | POA: Diagnosis not present

## 2023-02-15 DIAGNOSIS — Z79899 Other long term (current) drug therapy: Secondary | ICD-10-CM | POA: Insufficient documentation

## 2023-02-15 DIAGNOSIS — F32A Depression, unspecified: Secondary | ICD-10-CM

## 2023-02-15 DIAGNOSIS — G479 Sleep disorder, unspecified: Secondary | ICD-10-CM | POA: Diagnosis present

## 2023-02-15 DIAGNOSIS — F1090 Alcohol use, unspecified, uncomplicated: Secondary | ICD-10-CM

## 2023-02-15 LAB — CBC WITH DIFFERENTIAL/PLATELET
Abs Immature Granulocytes: 0.02 10*3/uL (ref 0.00–0.07)
Basophils Absolute: 0.1 10*3/uL (ref 0.0–0.1)
Basophils Relative: 1 %
Eosinophils Absolute: 0.4 10*3/uL (ref 0.0–0.5)
Eosinophils Relative: 6 %
HCT: 42.5 % (ref 39.0–52.0)
Hemoglobin: 14.1 g/dL (ref 13.0–17.0)
Immature Granulocytes: 0 %
Lymphocytes Relative: 36 %
Lymphs Abs: 2.3 10*3/uL (ref 0.7–4.0)
MCH: 28.3 pg (ref 26.0–34.0)
MCHC: 33.2 g/dL (ref 30.0–36.0)
MCV: 85.2 fL (ref 80.0–100.0)
Monocytes Absolute: 0.5 10*3/uL (ref 0.1–1.0)
Monocytes Relative: 8 %
Neutro Abs: 3.2 10*3/uL (ref 1.7–7.7)
Neutrophils Relative %: 49 %
Platelets: 208 10*3/uL (ref 150–400)
RBC: 4.99 MIL/uL (ref 4.22–5.81)
RDW: 12.7 % (ref 11.5–15.5)
WBC: 6.4 10*3/uL (ref 4.0–10.5)
nRBC: 0 % (ref 0.0–0.2)

## 2023-02-15 LAB — COMPREHENSIVE METABOLIC PANEL
ALT: 83 U/L — ABNORMAL HIGH (ref 0–44)
AST: 32 U/L (ref 15–41)
Albumin: 4.1 g/dL (ref 3.5–5.0)
Alkaline Phosphatase: 61 U/L (ref 38–126)
Anion gap: 13 (ref 5–15)
BUN: 14 mg/dL (ref 8–23)
CO2: 21 mmol/L — ABNORMAL LOW (ref 22–32)
Calcium: 9.7 mg/dL (ref 8.9–10.3)
Chloride: 105 mmol/L (ref 98–111)
Creatinine, Ser: 0.84 mg/dL (ref 0.61–1.24)
GFR, Estimated: >60 mL/min (ref >60)
Glucose, Bld: 116 mg/dL — ABNORMAL HIGH (ref 70–99)
Potassium: 4.2 mmol/L (ref 3.5–5.1)
Sodium: 139 mmol/L (ref 135–145)
Total Bilirubin: 0.4 mg/dL (ref 0.3–1.2)
Total Protein: 6.5 g/dL (ref 6.5–8.1)

## 2023-02-15 LAB — POCT URINE DRUG SCREEN - MANUAL ENTRY (I-SCREEN)
POC Amphetamine UR: NOT DETECTED
POC Buprenorphine (BUP): NOT DETECTED
POC Cocaine UR: NOT DETECTED
POC Marijuana UR: NOT DETECTED
POC Methadone UR: NOT DETECTED
POC Methamphetamine UR: NOT DETECTED
POC Morphine: NOT DETECTED
POC Oxazepam (BZO): NOT DETECTED
POC Oxycodone UR: NOT DETECTED
POC Secobarbital (BAR): NOT DETECTED

## 2023-02-15 LAB — LIPID PANEL
Cholesterol: 154 mg/dL (ref 0–200)
HDL: 43 mg/dL (ref >40)
LDL Cholesterol: 67 mg/dL (ref 0–99)
Total CHOL/HDL Ratio: 3.6 RATIO
Triglycerides: 219 mg/dL — ABNORMAL HIGH (ref <150)
VLDL: 44 mg/dL — ABNORMAL HIGH (ref 0–40)

## 2023-02-15 LAB — ETHANOL: Alcohol, Ethyl (B): <10 mg/dL (ref <10)

## 2023-02-15 LAB — POCT GLYCOSYLATED HEMOGLOBIN (HGB A1C): Hemoglobin A1C: 6.7 % — AB (ref 4.0–5.6)

## 2023-02-15 LAB — HEMOGLOBIN A1C
Hgb A1c MFr Bld: 7.2 % — ABNORMAL HIGH (ref 4.8–5.6)
Mean Plasma Glucose: 159.94 mg/dL

## 2023-02-15 LAB — TSH: TSH: 2.497 u[IU]/mL (ref 0.350–4.500)

## 2023-02-15 MED ORDER — EPINEPHRINE 0.3 MG/0.3ML IJ SOAJ: 0.3000 mg | INTRAMUSCULAR | Status: DC | PRN

## 2023-02-15 MED ORDER — ZIPRASIDONE MESYLATE 20 MG IM SOLR: 20.0000 mg | INTRAMUSCULAR | Status: DC | PRN

## 2023-02-15 MED ORDER — OLANZAPINE 10 MG PO TBDP: 10.0000 mg | ORAL_TABLET | Freq: Three times a day (TID) | ORAL | Status: DC | PRN

## 2023-02-15 MED ORDER — HYDROXYZINE HCL 25 MG PO TABS: 25.0000 mg | ORAL_TABLET | Freq: Four times a day (QID) | ORAL | Status: DC | PRN

## 2023-02-15 MED ORDER — LOPERAMIDE HCL 2 MG PO CAPS: 2.0000 mg | ORAL_CAPSULE | ORAL | Status: DC | PRN

## 2023-02-15 MED ORDER — LORAZEPAM 1 MG PO TABS
1.0000 mg | ORAL_TABLET | Freq: Every day | ORAL | Status: AC
  Administered 2023-02-19: 1 mg via ORAL
  Filled 2023-02-15: qty 1

## 2023-02-15 MED ORDER — VITAMIN D 25 MCG (1000 UNIT) PO TABS
5000.0000 [IU] | ORAL_TABLET | ORAL | Status: DC
  Administered 2023-02-16 – 2023-02-18 (×2): 5000 [IU] via ORAL
  Filled 2023-02-15 (×2): qty 5

## 2023-02-15 MED ORDER — ADULT MULTIVITAMIN W/MINERALS CH
1.0000 | ORAL_TABLET | Freq: Every day | ORAL | Status: DC
  Administered 2023-02-17: 1 via ORAL
  Filled 2023-02-15 (×3): qty 1

## 2023-02-15 MED ORDER — ONDANSETRON 4 MG PO TBDP: 4.0000 mg | ORAL_TABLET | Freq: Four times a day (QID) | ORAL | Status: DC | PRN

## 2023-02-15 MED ORDER — ACETAMINOPHEN 325 MG PO TABS: 650.0000 mg | ORAL_TABLET | Freq: Four times a day (QID) | ORAL | Status: DC | PRN

## 2023-02-15 MED ORDER — LORAZEPAM 1 MG PO TABS: 1.0000 mg | ORAL_TABLET | Freq: Every day | ORAL | Status: DC

## 2023-02-15 MED ORDER — LORAZEPAM 1 MG PO TABS: 1.0000 mg | ORAL_TABLET | Freq: Two times a day (BID) | ORAL | Status: DC

## 2023-02-15 MED ORDER — THIAMINE MONONITRATE 100 MG PO TABS: 100.0000 mg | ORAL_TABLET | Freq: Every day | ORAL | Status: DC

## 2023-02-15 MED ORDER — LORAZEPAM 1 MG PO TABS
1.0000 mg | ORAL_TABLET | Freq: Two times a day (BID) | ORAL | Status: AC
  Administered 2023-02-17 – 2023-02-18 (×2): 1 mg via ORAL
  Filled 2023-02-15 (×2): qty 1

## 2023-02-15 MED ORDER — TRAZODONE HCL 50 MG PO TABS: 50.0000 mg | ORAL_TABLET | Freq: Every evening | ORAL | Status: DC | PRN

## 2023-02-15 MED ORDER — LORAZEPAM 1 MG PO TABS: 1.0000 mg | ORAL_TABLET | Freq: Four times a day (QID) | ORAL | Status: DC

## 2023-02-15 MED ORDER — THIAMINE MONONITRATE 100 MG PO TABS
100.0000 mg | ORAL_TABLET | Freq: Every day | ORAL | Status: DC
  Administered 2023-02-16 – 2023-02-19 (×4): 100 mg via ORAL
  Filled 2023-02-15 (×4): qty 1

## 2023-02-15 MED ORDER — MAGNESIUM HYDROXIDE 400 MG/5ML PO SUSP: 30.0000 mL | Freq: Every day | ORAL | Status: DC | PRN

## 2023-02-15 MED ORDER — LORAZEPAM 1 MG PO TABS: 1.0000 mg | ORAL_TABLET | ORAL | Status: DC | PRN

## 2023-02-15 MED ORDER — EZETIMIBE 10 MG PO TABS
10.0000 mg | ORAL_TABLET | Freq: Every day | ORAL | 0 refills | Status: DC
Start: 2023-02-15 — End: 2023-05-20

## 2023-02-15 MED ORDER — METOPROLOL TARTRATE 50 MG PO TABS
100.0000 mg | ORAL_TABLET | Freq: Two times a day (BID) | ORAL | Status: DC
  Administered 2023-02-16 – 2023-02-19 (×7): 100 mg via ORAL
  Filled 2023-02-15 (×7): qty 2

## 2023-02-15 MED ORDER — LORAZEPAM 1 MG PO TABS: 1.0000 mg | ORAL_TABLET | Freq: Four times a day (QID) | ORAL | Status: DC | PRN

## 2023-02-15 MED ORDER — NITROGLYCERIN 0.4 MG SL SUBL: 0.4000 mg | SUBLINGUAL_TABLET | SUBLINGUAL | Status: DC | PRN

## 2023-02-15 MED ORDER — EZETIMIBE 10 MG PO TABS
10.0000 mg | ORAL_TABLET | Freq: Every day | ORAL | Status: DC
  Administered 2023-02-16 – 2023-02-19 (×4): 10 mg via ORAL
  Filled 2023-02-15 (×4): qty 1

## 2023-02-15 MED ORDER — ASPIRIN 81 MG PO TBEC
81.0000 mg | DELAYED_RELEASE_TABLET | Freq: Every day | ORAL | Status: DC
  Administered 2023-02-16 – 2023-02-19 (×4): 81 mg via ORAL
  Filled 2023-02-15 (×4): qty 1

## 2023-02-15 MED ORDER — MIRTAZAPINE 15 MG PO TABS: 15.0000 mg | ORAL_TABLET | Freq: Every day | ORAL | 0 refills | Status: DC

## 2023-02-15 MED ORDER — THIAMINE HCL 100 MG/ML IJ SOLN
100.0000 mg | Freq: Once | INTRAMUSCULAR | Status: AC
  Administered 2023-02-15: 100 mg via INTRAMUSCULAR
  Filled 2023-02-15: qty 2

## 2023-02-15 MED ORDER — AMLODIPINE BESYLATE 10 MG PO TABS
10.0000 mg | ORAL_TABLET | Freq: Every day | ORAL | Status: DC
  Administered 2023-02-16 – 2023-02-19 (×4): 10 mg via ORAL
  Filled 2023-02-15 (×4): qty 1

## 2023-02-15 MED ORDER — THIAMINE HCL 100 MG/ML IJ SOLN: 100.0000 mg | Freq: Once | INTRAMUSCULAR | Status: DC

## 2023-02-15 MED ORDER — LORAZEPAM 1 MG PO TABS: 1.0000 mg | ORAL_TABLET | Freq: Three times a day (TID) | ORAL | Status: DC

## 2023-02-15 MED ORDER — ALUM & MAG HYDROXIDE-SIMETH 200-200-20 MG/5ML PO SUSP: 30.0000 mL | ORAL | Status: DC | PRN

## 2023-02-15 MED ORDER — METFORMIN HCL ER 500 MG PO TB24
500.0000 mg | ORAL_TABLET | Freq: Every day | ORAL | Status: DC
  Administered 2023-02-16 – 2023-02-19 (×4): 500 mg via ORAL
  Filled 2023-02-15 (×4): qty 1

## 2023-02-15 MED ORDER — LORAZEPAM 1 MG PO TABS
1.0000 mg | ORAL_TABLET | Freq: Three times a day (TID) | ORAL | Status: AC
  Administered 2023-02-16 – 2023-02-17 (×3): 1 mg via ORAL
  Filled 2023-02-15 (×3): qty 1

## 2023-02-15 MED ORDER — AMLODIPINE BESYLATE 10 MG PO TABS: 10.0000 mg | ORAL_TABLET | Freq: Every day | ORAL | 0 refills | Status: DC

## 2023-02-15 MED ORDER — CLOPIDOGREL BISULFATE 75 MG PO TABS: 75.0000 mg | ORAL_TABLET | Freq: Every day | ORAL | 2 refills | Status: DC

## 2023-02-15 MED ORDER — LORAZEPAM 1 MG PO TABS
1.0000 mg | ORAL_TABLET | Freq: Four times a day (QID) | ORAL | Status: AC
  Administered 2023-02-15 – 2023-02-16 (×4): 1 mg via ORAL
  Filled 2023-02-15 (×5): qty 1

## 2023-02-15 MED ORDER — POLYVINYL ALCOHOL 1.4 % OP SOLN: 1.0000 [drp] | Freq: Every day | OPHTHALMIC | Status: DC | PRN

## 2023-02-15 MED ORDER — TRAZODONE HCL 50 MG PO TABS: 50.0000 mg | ORAL_TABLET | Freq: Every evening | ORAL | 0 refills | Status: DC | PRN

## 2023-02-15 MED ORDER — IBUPROFEN 800 MG PO TABS: 800.0000 mg | ORAL_TABLET | Freq: Three times a day (TID) | ORAL | 0 refills | Status: DC | PRN

## 2023-02-15 MED ORDER — CLOPIDOGREL BISULFATE 75 MG PO TABS
75.0000 mg | ORAL_TABLET | Freq: Every day | ORAL | Status: DC
  Administered 2023-02-16 – 2023-02-19 (×4): 75 mg via ORAL
  Filled 2023-02-15 (×4): qty 1

## 2023-02-15 MED ORDER — METFORMIN HCL ER 500 MG PO TB24
500.0000 mg | ORAL_TABLET | Freq: Every day | ORAL | 2 refills | Status: DC
Start: 2023-02-15 — End: 2023-05-20

## 2023-02-15 MED ORDER — ADULT MULTIVITAMIN W/MINERALS CH: 1.0000 | ORAL_TABLET | Freq: Every day | ORAL | Status: DC

## 2023-02-15 MED ORDER — ADULT MULTIVITAMIN W/MINERALS CH
1.0000 | ORAL_TABLET | Freq: Every day | ORAL | Status: DC
  Administered 2023-02-16 – 2023-02-19 (×4): 1 via ORAL
  Filled 2023-02-15 (×4): qty 1

## 2023-02-15 MED ORDER — MIRTAZAPINE 15 MG PO TABS
15.0000 mg | ORAL_TABLET | Freq: Every day | ORAL | Status: DC
  Administered 2023-02-16 – 2023-02-18 (×3): 15 mg via ORAL
  Filled 2023-02-15 (×3): qty 1

## 2023-02-15 MED ORDER — METOPROLOL TARTRATE 100 MG PO TABS: 100.0000 mg | ORAL_TABLET | Freq: Two times a day (BID) | ORAL | 0 refills | Status: DC

## 2023-02-15 MED ORDER — ACETAMINOPHEN 325 MG PO TABS
650.0000 mg | ORAL_TABLET | Freq: Four times a day (QID) | ORAL | Status: DC | PRN
  Administered 2023-02-16: 650 mg via ORAL
  Filled 2023-02-15: qty 2

## 2023-02-15 NOTE — Patient Instructions (Addendum)
1. Type 2 diabetes mellitus with other circulatory complication, without long-term current use of insulin ([name])  - metFORMIN (GLUCOPHAGE-XR) 500 MG 24 hr tablet; Take 1 tablet (500 mg total) by mouth daily with breakfast.  Dispense: 30 tablet; Refill: 2 - POCT glycosylated hemoglobin (Hb A1C)  2. Hyperlipidemia, unspecified hyperlipidemia type  - ezetimibe (ZETIA) 10 MG tablet; Take 1 tablet (10 mg total) by mouth daily.  Dispense: 90 tablet; Refill: 0  3. Alcohol abuse  - Ambulatory referral to Psychiatry  4. Anxiety and depression  - Ambulatory referral to Psychiatry  -hand out given for patient for walk-in for alcoholic detox at [facility]  Follow up:  Follow up in 3 months

## 2023-02-15 NOTE — ED Provider Notes (Signed)
Facility Based Crisis Admission H&P  Date: 02/15/23 Patient Name: [name] MRN: [id number] Chief Complaint: alcohol abuse  Diagnoses:  Final diagnoses:  Alcohol abuse  Recurrent major depressive disorder, remission status unspecified ([name])  Anxious mood    HPI: [name], 62 y/o male with a history of alcohol abuse, general anxiety disorder, anxiety.  Presented to [facility] voluntarily.  Per the patient he is looking detox from every day drinking of alcohol.  According to the patient he was at is PCP today and she referred him over here to get detox.  Per the patient he has to do a colonoscopy next week and they told him he needs to stop drinking alcohol.  Review of patient records show patient has been in detox before and is noncompliant with treatment. According to patient he has been drinking since he was 62 years old and he was sober for 4 years and 1 month 6 years ago.  Patient reports he was seeing a psychiatrist for major depression and generalized anxiety.  Patient reports he is currently not seeing a therapist.  According to patient he does not work because he is on disability and he stays alone.   Face-to-face observation of patient, patient is alert and oriented x 4, speech is clear, maintained minimal eye contact.  Patient does appear anxious.  Affect is flat congruent with mood.  Patient denies SI, HI, AVH or paranoia at this time.  Patient endorsed drinking for beers a day including a couple bottles of wine daily.  Patient reports he smokes cigarettes denies any other illicit drug use at this time.  Patient does not appear to be influenced by external or internal stimuli at this time.  Discussed with patient the need for compliance with detox program.  Patient in agreement with plan of care.  PHQ9 was complete patient scored a 17 (moderately severe depression). Will restart patient home medications  Recommend FBC admission  PHQ 2-9:  Flowsheet Row ED from 02/15/2023 in  [facility] Most recent reading at 02/15/2023  9:08 PM Office Visit from 02/15/2023 in [facility] Most recent reading at 02/15/2023  8:21 AM Office Visit from 11/16/2022 in [facility] Most recent reading at 11/16/2022  3:58 PM  Thoughts that you would be better off dead, or of hurting yourself in some way Not at all Not at all Not at all  PHQ-9 Total Score 17 17 11        Flowsheet Row ED from 02/15/2023 in [facility] ED from 11/10/2022 in [facility] Emergency Department at [facility] ED from 11/07/2022 in [facility] Emergency Department at [facility]  C-SSRS RISK CATEGORY Low Risk No Risk No Risk         Total Time spent with patient: 20 minutes  Musculoskeletal  Strength & Muscle Tone: within normal limits Gait & Station: normal Patient leans: N/A  Psychiatric Specialty Exam  Presentation General Appearance:  Casual  Eye Contact: Good  Speech: Clear and Coherent  Speech Volume: Normal  Handedness: Right   Mood and Affect  Mood: Anxious; Depressed  Affect: Appropriate   Thought Process  Thought Processes: Coherent  Descriptions of Associations:Circumstantial  Orientation:Full (Time, Place and Person)  Thought Content:Logical  Diagnosis of Schizophrenia or Schizoaffective disorder in past: No   Hallucinations:Hallucinations: None  Ideas of Reference:None  Suicidal Thoughts:Suicidal Thoughts: No  Homicidal Thoughts:Homicidal Thoughts: No   Sensorium  Memory: Immediate Good  Judgment:  Fair  Insight: Corporate treasurer: Fair  Attention Span: Good  Recall: Dud[place] Major of Knowledge: Good  Language: Good   Psychomotor Activity  Psychomotor Activity: Psychomotor Activity: Normal   Assets  Assets: Desire for Improvement; Resilience   Sleep  Sleep: Sleep: Fair Number of Hours of Sleep:  6   Nutritional Assessment (For OBS and FBC admissions only) Has the patient had a weight loss or gain of 10 pounds or more in the last 3 months?: No Has the patient had a decrease in food intake/or appetite?: No Does the patient have dental problems?: No Does the patient have eating habits or behaviors that may be indicators of an eating disorder including binging or inducing vomiting?: No Has the patient recently lost weight without trying?: 0 Has the patient been eating poorly because of a decreased appetite?: 0 Malnutrition Screening Tool Score: 0    Physical Exam HENT:     Head: Normocephalic.     Nose: Nose normal.  Cardiovascular:     Rate and Rhythm: Normal rate.  Pulmonary:     Effort: Pulmonary effort is normal.  Musculoskeletal:        General: Normal range of motion.     Cervical back: Normal range of motion.  Neurological:     General: No focal deficit present.     Mental Status: He is alert.  Psychiatric:        Mood and Affect: Mood normal.        Behavior: Behavior normal.        Thought Content: Thought content normal.        Judgment: Judgment normal.    Review of Systems  Constitutional: Negative.   HENT: Negative.    Eyes: Negative.   Respiratory: Negative.    Cardiovascular: Negative.   Gastrointestinal: Negative.   Genitourinary: Negative.   Musculoskeletal: Negative.   Skin: Negative.   Neurological: Negative.   Psychiatric/Behavioral:  Positive for depression and substance abuse. The patient is nervous/anxious.     Blood pressure (!) 141/93, pulse 85, temperature 97.9 F (36.6 C), temperature source Oral, resp. rate 20, SpO2 100%. There is no height or weight on file to calculate BMI.  Past Psychiatric History: alcohol abuse,  GAD, MDD   Is the patient at risk to self? No  Has the patient been a risk to self in the past 6 months? No .    Has the patient been a risk to self within the distant past? No   Is the patient a risk to others?  No   Has the patient been a risk to others in the past 6 months? No   Has the patient been a risk to others within the distant past? No   Past Medical History: see chart Family History: see chart  Social History: alcohol,  cigarette  Last Labs:  Admission on 02/15/2023, Discharged on 02/15/2023  Component Date Value Ref Range Status   WBC 02/15/2023 6.4  4.0 - 10.5 K/uL Final   RBC 02/15/2023 4.99  4.22 - 5.81 MIL/uL Final   Hemoglobin 02/15/2023 14.1  13.0 - 17.0 g/dL Final   HCT 16/03/9603 42.5  39.0 - 52.0 % Final   MCV 02/15/2023 85.2  80.0 - 100.0 fL Final   MCH 02/15/2023 28.3  26.0 - 34.0 pg Final   MCHC 02/15/2023 33.2  30.0 - 36.0 g/dL Final   RDW 54/02/8118 12.7  11.5 - 15.5 % Final   Platelets 02/15/2023  208  150 - 400 K/uL Final   nRBC 02/15/2023 0.0  0.0 - 0.2 % Final   Neutrophils Relative % 02/15/2023 49  % Final   Neutro Abs 02/15/2023 3.2  1.7 - 7.7 K/uL Final   Lymphocytes Relative 02/15/2023 36  % Final   Lymphs Abs 02/15/2023 2.3  0.7 - 4.0 K/uL Final   Monocytes Relative 02/15/2023 8  % Final   Monocytes Absolute 02/15/2023 0.5  0.1 - 1.0 K/uL Final   Eosinophils Relative 02/15/2023 6  % Final   Eosinophils Absolute 02/15/2023 0.4  0.0 - 0.5 K/uL Final   Basophils Relative 02/15/2023 1  % Final   Basophils Absolute 02/15/2023 0.1  0.0 - 0.1 K/uL Final   Immature Granulocytes 02/15/2023 0  % Final   Abs Immature Granulocytes 02/15/2023 0.02  0.00 - 0.07 K/uL Final   Performed at [facility], [address], [place], [place] [address]   Sodium 02/15/2023 139  135 - 145 mmol/L Final   Potassium 02/15/2023 4.2  3.5 - 5.1 mmol/L Final   Chloride 02/15/2023 105  98 - 111 mmol/L Final   CO2 02/15/2023 21 (L)  22 - 32 mmol/L Final   Glucose, Bld 02/15/2023 116 (H)  70 - 99 mg/dL Final   Glucose reference range applies only to samples taken after fasting for at least 8 hours.   BUN 02/15/2023 14  8 - 23 mg/dL Final   Creatinine, Ser 02/15/2023 0.84  0.61 -  1.24 mg/dL Final   Calcium 60/45/4098 9.7  8.9 - 10.3 mg/dL Final   Total Protein 11/91/4782 6.5  6.5 - 8.1 g/dL Final   Albumin 95/62/1308 4.1  3.5 - 5.0 g/dL Final   AST 65/78/4696 32  15 - 41 U/L Final   ALT 02/15/2023 83 (H)  0 - 44 U/L Final   Alkaline Phosphatase 02/15/2023 61  38 - 126 U/L Final   Total Bilirubin 02/15/2023 0.4  0.3 - 1.2 mg/dL Final   GFR, Estimated 02/15/2023 >60  >60 mL/min Final   Comment: (NOTE) Calculated using the CKD-EPI Creatinine Equation (2021)    Anion gap 02/15/2023 13  5 - 15 Final   Performed at [facility], [address] [place], [place] [address]   Hgb A1c MFr Bld 02/15/2023 7.2 (H)  4.8 - 5.6 % Final   Comment: (NOTE) Pre diabetes:          5.7%-6.4%  Diabetes:              >6.4%  Glycemic control for   <7.0% adults with diabetes    Mean Plasma Glucose 02/15/2023 159.94  mg/dL Final   Performed at [facility], [address] [place], [place] [address]   Alcohol, Ethyl (B) 02/15/2023 <10  <10 mg/dL Final   Comment: (NOTE) Lowest detectable limit for serum alcohol is 10 mg/dL.  For medical purposes only. Performed at [facility], [address], [place], [place] [address]    POC Amphetamine UR 02/15/2023 None Detected  NONE DETECTED (Cut Off Level 1000 ng/mL) Final   POC Secobarbital (BAR) 02/15/2023 None Detected  NONE DETECTED (Cut Off Level 300 ng/mL) Final   POC Buprenorphine (BUP) 02/15/2023 None Detected  NONE DETECTED (Cut Off Level 10 ng/mL) Final   POC Oxazepam (BZO) 02/15/2023 None Detected  NONE DETECTED (Cut Off Level 300 ng/mL) Final   POC Cocaine UR 02/15/2023 None Detected  NONE DETECTED (Cut Off Level 300 ng/mL) Final  POC Methamphetamine UR 02/15/2023 None Detected  NONE DETECTED (Cut Off Level 1000 ng/mL) Final   POC Morphine 02/15/2023 None Detected  NONE DETECTED (Cut Off Level 300 ng/mL) Final   POC Methadone UR 02/15/2023 None Detected  NONE DETECTED (Cut Off Level 300 ng/mL) Final   POC  Oxycodone UR 02/15/2023 None Detected  NONE DETECTED (Cut Off Level 100 ng/mL) Final   POC Marijuana UR 02/15/2023 None Detected  NONE DETECTED (Cut Off Level 50 ng/mL) Final   Cholesterol 02/15/2023 154  0 - 200 mg/dL Final   Triglycerides 78/29/5621 219 (H)  <150 mg/dL Final   HDL 30/86/5784 43  >40 mg/dL Final   Total CHOL/HDL Ratio 02/15/2023 3.6  RATIO Final   VLDL 02/15/2023 44 (H)  0 - 40 mg/dL Final   LDL Cholesterol 02/15/2023 67  0 - 99 mg/dL Final   Comment:        Total Cholesterol/HDL:CHD Risk Coronary Heart Disease Risk Table                     Men   Women  1/2 Average Risk   3.4   3.3  Average Risk       5.0   4.4  2 X Average Risk   9.6   7.1  3 X Average Risk  23.4   11.0        Use the calculated Patient Ratio above and the CHD Risk Table to determine the patient's CHD Risk.        ATP III CLASSIFICATION (LDL):  <100     mg/dL   Optimal  696-295  mg/dL   Near or Above                    Optimal  130-159  mg/dL   Borderline  284-132  mg/dL   High  >440     mg/dL   Very High Performed at [facility], [address], [place], [place] [address]    TSH 02/15/2023 2.497  0.350 - 4.500 uIU/mL Final   Comment: Performed by a 3rd Generation assay with a functional sensitivity of <=0.01 uIU/mL. Performed at [facility], [address] [place], [place] [address]   Office Visit on 02/15/2023  Component Date Value Ref Range Status   Hemoglobin A1C 02/15/2023 6.7 (A)  4.0 - 5.6 % Final  Appointment on 11/18/2022  Component Date Value Ref Range Status   Testosterone 11/18/2022 397  264 - 916 ng/dL Final   Comment: Adult male reference interval is based on a population of healthy nonobese males (BMI <30) between 64 and 22 years old. Travison, et.al. JCEM [phone]. PMID: [id number].    Cholesterol, Total 11/18/2022 114  100 - 199 mg/dL Final   Triglycerides 88/41/6606 130  0 - 149 mg/dL Final   HDL 30/16/0109 35 (L)  >39 mg/dL Final   VLDL  Cholesterol Cal 11/18/2022 23  5 - 40 mg/dL Final   LDL Chol Calc (NIH) 11/18/2022 56  0 - 99 mg/dL Final   Chol/HDL Ratio 11/18/2022 3.3  0.0 - 5.0 ratio Final   Comment:                                   T. Chol/HDL Ratio  Men  Women                               1/2 Avg.Risk  3.4    3.3                                   Avg.Risk  5.0    4.4                                2X Avg.Risk  9.6    7.1                                3X Avg.Risk 23.4   11.0   Office Visit on 11/16/2022  Component Date Value Ref Range Status   Creatinine, Urine 11/18/2022 134.0  Not Estab. mg/dL Final   Microalbumin, Urine 11/18/2022 <3.0  Not Estab. ug/mL Final   Microalb/Creat Ratio 11/18/2022 <2  0 - 29 mg/g creat Final   Comment:                        Normal:                0 -  29                        Moderately increased: 30 - 300                        Severely increased:       >300   Admission on 11/10/2022, Discharged on 11/10/2022  Component Date Value Ref Range Status   Lipase 11/10/2022 30  11 - 51 U/L Final   Performed at [facility], [address] [place], [place] [address]   Sodium 11/10/2022 138  135 - 145 mmol/L Final   Potassium 11/10/2022 4.1  3.5 - 5.1 mmol/L Final   Chloride 11/10/2022 100  98 - 111 mmol/L Final   CO2 11/10/2022 29  22 - 32 mmol/L Final   Glucose, Bld 11/10/2022 243 (H)  70 - 99 mg/dL Final   Glucose reference range applies only to samples taken after fasting for at least 8 hours.   BUN 11/10/2022 13  8 - 23 mg/dL Final   Creatinine, Ser 11/10/2022 1.00  0.61 - 1.24 mg/dL Final   Calcium 60/45/4098 9.2  8.9 - 10.3 mg/dL Final   Total Protein 11/91/4782 6.8  6.5 - 8.1 g/dL Final   Albumin 95/62/1308 4.1  3.5 - 5.0 g/dL Final   AST 65/78/4696 23  15 - 41 U/L Final   ALT 11/10/2022 38  0 - 44 U/L Final   Alkaline Phosphatase 11/10/2022 69  38 - 126 U/L Final   Total Bilirubin 11/10/2022 0.4  0.3 - 1.2 mg/dL  Final   GFR, Estimated 11/10/2022 >60  >60 mL/min Final   Comment: (NOTE) Calculated using the CKD-EPI Creatinine Equation (2021)    Anion gap 11/10/2022 9  5 - 15 Final   Performed at [facility], [address] [place], [place] [address]   WBC 11/10/2022 6.1  4.0 - 10.5 K/uL Final   RBC 11/10/2022 5.64  4.22 - 5.81 MIL/uL Final  Hemoglobin 11/10/2022 15.7  13.0 - 17.0 g/dL Final   HCT 78/29/5621 49.2  39.0 - 52.0 % Final   MCV 11/10/2022 87.2  80.0 - 100.0 fL Final   MCH 11/10/2022 27.8  26.0 - 34.0 pg Final   MCHC 11/10/2022 31.9  30.0 - 36.0 g/dL Final   RDW 30/86/5784 12.4  11.5 - 15.5 % Final   Platelets 11/10/2022 209  150 - 400 K/uL Final   nRBC 11/10/2022 0.0  0.0 - 0.2 % Final   Performed at [facility], [address], [place], [place] [address]  Admission on 11/07/2022, Discharged on 11/07/2022  Component Date Value Ref Range Status   Lipase 11/07/2022 27  11 - 51 U/L Final   Performed at [facility], [address] [place], [place] [address]   Sodium 11/07/2022 140  135 - 145 mmol/L Final   Potassium 11/07/2022 4.0  3.5 - 5.1 mmol/L Final   Chloride 11/07/2022 108  98 - 111 mmol/L Final   CO2 11/07/2022 24  22 - 32 mmol/L Final   Glucose, Bld 11/07/2022 195 (H)  70 - 99 mg/dL Final   Glucose reference range applies only to samples taken after fasting for at least 8 hours.   BUN 11/07/2022 11  8 - 23 mg/dL Final   Creatinine, Ser 11/07/2022 0.76  0.61 - 1.24 mg/dL Final   Calcium 32/44/0102 8.6 (L)  8.9 - 10.3 mg/dL Final   Total Protein 72/53/6644 6.3 (L)  6.5 - 8.1 g/dL Final   Albumin 03/47/4259 3.8  3.5 - 5.0 g/dL Final   AST 56/38/7564 21  15 - 41 U/L Final   ALT 11/07/2022 38  0 - 44 U/L Final   Alkaline Phosphatase 11/07/2022 65  38 - 126 U/L Final   Total Bilirubin 11/07/2022 0.4  0.3 - 1.2 mg/dL Final   GFR, Estimated 11/07/2022 >60  >60 mL/min Final   Comment: (NOTE) Calculated using the CKD-EPI Creatinine Equation  (2021)    Anion gap 11/07/2022 8  5 - 15 Final   Performed at [facility], [address] [place], [place] [address]   WBC 11/07/2022 4.9  4.0 - 10.5 K/uL Final   RBC 11/07/2022 5.27  4.22 - 5.81 MIL/uL Final   Hemoglobin 11/07/2022 14.7  13.0 - 17.0 g/dL Final   HCT 18/84/1660 45.4  39.0 - 52.0 % Final   MCV 11/07/2022 86.1  80.0 - 100.0 fL Final   MCH 11/07/2022 27.9  26.0 - 34.0 pg Final   MCHC 11/07/2022 32.4  30.0 - 36.0 g/dL Final   RDW 63/06/6008 12.6  11.5 - 15.5 % Final   Platelets 11/07/2022 196  150 - 400 K/uL Final   nRBC 11/07/2022 0.0  0.0 - 0.2 % Final   Neutrophils Relative % 11/07/2022 77  % Final   Neutro Abs 11/07/2022 3.7  1.7 - 7.7 K/uL Final   Lymphocytes Relative 11/07/2022 14  % Final   Lymphs Abs 11/07/2022 0.7  0.7 - 4.0 K/uL Final   Monocytes Relative 11/07/2022 7  % Final   Monocytes Absolute 11/07/2022 0.4  0.1 - 1.0 K/uL Final   Eosinophils Relative 11/07/2022 2  % Final   Eosinophils Absolute 11/07/2022 0.1  0.0 - 0.5 K/uL Final   Basophils Relative 11/07/2022 0  % Final   Basophils Absolute 11/07/2022 0.0  0.0 - 0.1 K/uL Final   Immature Granulocytes 11/07/2022 0  % Final   Abs Immature Granulocytes 11/07/2022 0.02  0.00 -  0.07 K/uL Final   Performed at [facility], [address] [place], [place] [address]  Admission on 11/06/2022, Discharged on 11/06/2022  Component Date Value Ref Range Status   Sodium 11/06/2022 137  135 - 145 mmol/L Final   Potassium 11/06/2022 4.3  3.5 - 5.1 mmol/L Final   Chloride 11/06/2022 107  98 - 111 mmol/L Final   CO2 11/06/2022 24  22 - 32 mmol/L Final   Glucose, Bld 11/06/2022 186 (H)  70 - 99 mg/dL Final   Glucose reference range applies only to samples taken after fasting for at least 8 hours.   BUN 11/06/2022 11  8 - 23 mg/dL Final   Creatinine, Ser 11/06/2022 1.05  0.61 - 1.24 mg/dL Final   Calcium 19/14/7829 8.8 (L)  8.9 - 10.3 mg/dL Final   Total Protein 56/21/3086 6.8  6.5 - 8.1 g/dL  Final   Albumin 57/84/6962 4.3  3.5 - 5.0 g/dL Final   AST 95/28/4132 27  15 - 41 U/L Final   ALT 11/06/2022 48 (H)  0 - 44 U/L Final   Alkaline Phosphatase 11/06/2022 61  38 - 126 U/L Final   Total Bilirubin 11/06/2022 0.6  0.3 - 1.2 mg/dL Final   GFR, Estimated 11/06/2022 >60  >60 mL/min Final   Comment: (NOTE) Calculated using the CKD-EPI Creatinine Equation (2021)    Anion gap 11/06/2022 6  5 - 15 Final   Performed at [facility], [address] [place], [place] [address]   Lipase 11/06/2022 31  11 - 51 U/L Final   Performed at [facility], [address] [place], [place] [address]   WBC 11/06/2022 8.3  4.0 - 10.5 K/uL Final   RBC 11/06/2022 5.31  4.22 - 5.81 MIL/uL Final   Hemoglobin 11/06/2022 14.8  13.0 - 17.0 g/dL Final   HCT 66/44/0347 45.5  39.0 - 52.0 % Final   MCV 11/06/2022 85.7  80.0 - 100.0 fL Final   MCH 11/06/2022 27.9  26.0 - 34.0 pg Final   MCHC 11/06/2022 32.5  30.0 - 36.0 g/dL Final   RDW 42/59/5638 12.4  11.5 - 15.5 % Final   Platelets 11/06/2022 214  150 - 400 K/uL Final   nRBC 11/06/2022 0.0  0.0 - 0.2 % Final   Neutrophils Relative % 11/06/2022 79  % Final   Neutro Abs 11/06/2022 6.5  1.7 - 7.7 K/uL Final   Lymphocytes Relative 11/06/2022 14  % Final   Lymphs Abs 11/06/2022 1.2  0.7 - 4.0 K/uL Final   Monocytes Relative 11/06/2022 6  % Final   Monocytes Absolute 11/06/2022 0.5  0.1 - 1.0 K/uL Final   Eosinophils Relative 11/06/2022 1  % Final   Eosinophils Absolute 11/06/2022 0.1  0.0 - 0.5 K/uL Final   Basophils Relative 11/06/2022 0  % Final   Basophils Absolute 11/06/2022 0.0  0.0 - 0.1 K/uL Final   Immature Granulocytes 11/06/2022 0  % Final   Abs Immature Granulocytes 11/06/2022 0.03  0.00 - 0.07 K/uL Final   Performed at [facility], [address] [place], [place] [address]   Color, Urine 11/06/2022 YELLOW (A)  YELLOW Final   APPearance 11/06/2022 CLEAR (A)  CLEAR Final   Specific Gravity, Urine 11/06/2022  1.018  1.005 - 1.030 Final   pH 11/06/2022 6.0  5.0 - 8.0 Final   Glucose, UA 11/06/2022 >=500 (A)  NEGATIVE mg/dL Final   Hgb urine dipstick 11/06/2022 NEGATIVE  NEGATIVE Final  Bilirubin Urine 11/06/2022 NEGATIVE  NEGATIVE Final   Ketones, ur 11/06/2022 NEGATIVE  NEGATIVE mg/dL Final   Protein, ur 19/14/7829 30 (A)  NEGATIVE mg/dL Final   Nitrite 56/21/3086 NEGATIVE  NEGATIVE Final   Leukocytes,Ua 11/06/2022 NEGATIVE  NEGATIVE Final   RBC / HPF 11/06/2022 0-5  0 - 5 RBC/hpf Final   WBC, UA 11/06/2022 0-5  0 - 5 WBC/hpf Final   Bacteria, UA 11/06/2022 NONE SEEN  NONE SEEN Final   Squamous Epithelial / HPF 11/06/2022 0-5  0 - 5 /HPF Final   Mucus 11/06/2022 PRESENT   Final   Performed at [facility], [address] [place], [place] [address]    Allergies: Bee venom, Gadolinium, Shellfish allergy, Statins, and Testosterone cypionate  Medications:  Facility Ordered Medications  Medication   acetaminophen (TYLENOL) tablet 650 mg   alum & mag hydroxide-simeth (MAALOX/MYLANTA) 200-200-20 MG/5ML suspension 30 mL   magnesium hydroxide (MILK OF MAGNESIA) suspension 30 mL   [COMPLETED] thiamine (VITAMIN B1) injection 100 mg   [START ON 02/16/2023] thiamine (VITAMIN B1) tablet 100 mg   [START ON 02/16/2023] multivitamin with minerals tablet 1 tablet   LORazepam (ATIVAN) tablet 1 mg   hydrOXYzine (ATARAX) tablet 25 mg   loperamide (IMODIUM) capsule 2-4 mg   ondansetron (ZOFRAN-ODT) disintegrating tablet 4 mg   LORazepam (ATIVAN) tablet 1 mg   Followed by   Melene Muller ON 02/16/2023] LORazepam (ATIVAN) tablet 1 mg   Followed by   Melene Muller ON 02/17/2023] LORazepam (ATIVAN) tablet 1 mg   Followed by   Melene Muller ON 02/19/2023] LORazepam (ATIVAN) tablet 1 mg   OLANZapine zydis (ZYPREXA) disintegrating tablet 10 mg   And   LORazepam (ATIVAN) tablet 1 mg   And   ziprasidone (GEODON) injection 20 mg   [START ON 02/16/2023] aspirin EC tablet 81 mg   [START ON 02/16/2023] amLODipine (NORVASC)  tablet 10 mg   [START ON 02/16/2023] Cholecalciferol 5,000 Units   [START ON 02/16/2023] clopidogrel (PLAVIX) tablet 75 mg   EPINEPHrine (EPI-PEN) injection 0.3 mg   [START ON 02/16/2023] ezetimibe (ZETIA) tablet 10 mg   [START ON 02/16/2023] metFORMIN (GLUCOPHAGE-XR) 24 hr tablet 500 mg   [START ON 02/16/2023] metoprolol tartrate (LOPRESSOR) tablet 100 mg   [START ON 02/16/2023] mirtazapine (REMERON) tablet 15 mg   nitroGLYCERIN (NITROSTAT) SL tablet 0.4 mg   [START ON 02/16/2023] multivitamin with minerals tablet 1 tablet   polyethylene glycol 0.4% and propylene glycol 0.3% (SYSTANE) ophthalmic gel   traZODone (DESYREL) tablet 50 mg   PTA Medications  Medication Sig   EPINEPHrine 0.3 mg/0.3 mL IJ SOAJ injection Inject 0.3 mg into the muscle as needed for anaphylaxis.   aspirin 81 MG EC tablet Take 1 tablet (81 mg total) by mouth daily. (May buy from over the counter): Swallow whole for heart health (Patient taking differently: Take 81 mg by mouth at bedtime. (May buy from over the counter): Swallow whole for heart health)   nitroGLYCERIN (NITROSTAT) 0.4 MG SL tablet Place 1 tablet (0.4 mg total) under the tongue every 5 (five) minutes x 3 doses as needed for chest pain.   Polyethyl Glycol-Propyl Glycol (SYSTANE) 0.4-0.3 % GEL ophthalmic gel Place 1 Application into both eyes daily as needed (For dry eyes).   LIVALO 4 MG TABS Take 1 tablet by mouth once daily   Cholecalciferol 25 MCG (1000 UT) tablet Take 5,000 Units by mouth every other day.   Multiple Vitamin (MULTIVITAMIN WITH MINERALS) TABS tablet Take 1 tablet by mouth  daily.   Probiotic Product (RESTORA) CAPS Take 1 capsule by mouth daily. (Patient not taking: Reported on 01/18/2023)   glucose blood (ACCU-CHEK GUIDE) test strip USE 1 STRIP TO CHECK GLUCOSE THREE TIMES DAILY AS DIRECTED   Testosterone 1.62 % GEL PLACE 1 PUMP ONTO THE SKIN DAILY.   sildenafil (VIAGRA) 100 MG tablet Take 100 mg by mouth daily as needed.   metFORMIN  (GLUCOPHAGE-XR) 500 MG 24 hr tablet Take 1 tablet (500 mg total) by mouth daily with breakfast.   traZODone (DESYREL) 50 MG tablet Take 1 tablet (50 mg total) by mouth at bedtime as needed for sleep.   mirtazapine (REMERON) 15 MG tablet Take 1 tablet (15 mg total) by mouth at bedtime.   metoprolol tartrate (LOPRESSOR) 100 MG tablet Take 1 tablet (100 mg total) by mouth 2 (two) times daily. NEED OV.   ezetimibe (ZETIA) 10 MG tablet Take 1 tablet (10 mg total) by mouth daily.   amLODipine (NORVASC) 10 MG tablet Take 1 tablet (10 mg total) by mouth daily.    Long Term Goals: Improvement in symptoms so as ready for discharge  Short Term Goals: Patient will verbalize feelings in meetings with treatment team members., Patient will attend at least of 50% of the groups daily., Pt will complete the PHQ9 on admission, day 3 and discharge., Patient will participate in completing the [place] Suicide Severity Rating Scale, Patient will score a low risk of violence for 24 hours prior to discharge, and Patient will take medications as prescribed daily.  Medical Decision Making  Inpatient [facility]    Recommendations  Based on my evaluation the patient does not appear to have an emergency medical condition.  Sindy Guadeloupe, NP 02/15/23  11:35 PM

## 2023-02-15 NOTE — Progress Notes (Signed)
@Patient  ID: [name], male    DOB: 23-Jul-1960, 62 y.o.   MRN: [id number]  Chief Complaint  Patient presents with   Diabetes    Follow up   Depression    Referring provider: Ivonne Andrew, NP   HPI  [name] presents for follow up. She  has a past medical history of Alcohol dependence ([name]), Allergies, Arthritis, Chest pain, Chronic lower back pain, Chronic pain of right wrist, Coronary artery disease, Dyslipidemia, GERD (gastroesophageal reflux disease), H/O cardiac catheterization (10/25/2018), Heart attack ([name]), Hypertension, Myocardial infarction ([name]) (~ 2010), S/P angioplasty with stent, DES, to proximal and mid LCX 12/15/11 (12/15/2011), S/P foot surgery, right (04/2021), Shoulder pain, Stroke ([name]), Type II diabetes mellitus ([name]), and Unstable angina ([name]).      Diabetes:   Mr. Sabet is in today for diabetes follow up. The prescribed treatment is metformin 500 mg  along with therapy zetia and livalo. The reported use of treatment is consistent with prescribed. He denies reported side effects from the treatment. Home glucose monitoring indicates a CBG normal range. His goal is to consisently eating healthy. He continues to have the fatigue. He states that he has not been doing well with diet or exercise. There has been a professional eye exam in the year. Closely followed podiatry.     He reports that he has note taken his medication. He continues to take metoprolol 100 mg BID. He does not monitor BP or HR at home. Denies f/c/s, n/v/d, hemoptysis, PND, leg swelling Denies chest pain or edema     Note: Cardiology - refilling htn medications   Patient is wanting to go for detox / alcohol abuse. Hand out was given on walk in rehab for [employer].      Allergies  Allergen Reactions   Bee Venom Anaphylaxis, Hives, Itching and Other (See Comments)    Red eyes   Gadolinium Hives, Itching, Swelling and Other (See Comments)    Swelling of eyes  after receiving MR contrast. 13 hr prep recommended. Pt developed large hive and swelling under right eye after contrast, given 50mg  iv benadryl, pt will need premed before gadolinium//lh   Shellfish Allergy Anaphylaxis, Hives, Itching and Other (See Comments)    Red eyes   Statins Other (See Comments)    Myalgias. Tolerating livalo. Pain    Testosterone Cypionate Other (See Comments)    Testerone Injection --Increased breast tissue     Immunization History  Administered Date(s) Administered   Influenza,inj,Quad PF,6+ Mos 07/28/2017, 04/01/2018, 03/05/2021   MODERNA COVID-19 SARS-COV-2 PEDS BIVALENT BOOSTER 69yr-9yr 12/09/2019, 01/10/2020   Moderna SARS-COV2 Booster Vaccination 08/13/2020   Pneumococcal Polysaccharide-23 07/28/2017, 03/05/2021   Pneumococcal-Unspecified 06/29/2009   Tdap 02/08/2020   Zoster Recombinant(Shingrix) 01/11/2013    Past Medical History:  Diagnosis Date   Alcohol dependence ([name])    Allergies    Arthritis    Chest pain    Chronic lower back pain    Chronic pain of right wrist    Coronary artery disease    a. Multiple prior caths/PCI. Cath 2013 with possible spasm of RCA, 70% ISR of mid LCx with subsequent DES to mLCx and prox LCX. b. H/o microvascular angina. c. Recurrent angina 08/2014 - s/p PTCA/DES to prox Cx, PTCA/CBA to OM1.  c. LHC 06/10/15 with patent stents and some ISR in LCX and OM-1 that was not flow limiting --> Rx    Dyslipidemia    a. Intolerant to many statins except  tolerating Livalo.   GERD (gastroesophageal reflux disease)    H/O cardiac catheterization 10/25/2018   Heart attack ([name])    Hypertension    Myocardial infarction ([name]) ~ 2010   S/P angioplasty with stent, DES, to proximal and mid LCX 12/15/11 12/15/2011   S/P foot surgery, right 04/2021   Shoulder pain    Stroke ([name])    pt. reports had a stroke around time of MI 2010   Type II diabetes mellitus ([name])    Unstable angina ([name])     Tobacco History: Social History    Tobacco Use  Smoking Status Former   Current packs/day: 0.00   Average packs/day: 1 pack/day for 10.0 years (10.0 ttl pk-yrs)   Types: Cigarettes   Start date: 10/06/2008   Quit date: 10/07/2018   Years since quitting: 4.3  Smokeless Tobacco Never   Counseling given: Not Answered   Outpatient Encounter Medications as of 02/15/2023  Medication Sig   aspirin 81 MG EC tablet Take 1 tablet (81 mg total) by mouth daily. (May buy from over the counter): Swallow whole for heart health (Patient taking differently: Take 81 mg by mouth at bedtime. (May buy from over the counter): Swallow whole for heart health)   Cholecalciferol 25 MCG (1000 UT) tablet Take 5,000 Units by mouth every other day.   EPINEPHrine 0.3 mg/0.3 mL IJ SOAJ injection Inject 0.3 mg into the muscle as needed for anaphylaxis.   glucose blood (ACCU-CHEK GUIDE) test strip USE 1 STRIP TO CHECK GLUCOSE THREE TIMES DAILY AS DIRECTED   hydrOXYzine (ATARAX) 25 MG tablet Take 25 mg by mouth 3 (three) times daily as needed.   ibuprofen (ADVIL) 800 MG tablet Take 1 tablet (800 mg total) by mouth every 8 (eight) hours as needed.   LIVALO 4 MG TABS Take 1 tablet by mouth once daily   Multiple Vitamin (MULTIVITAMIN WITH MINERALS) TABS tablet Take 1 tablet by mouth daily.   nitroGLYCERIN (NITROSTAT) 0.4 MG SL tablet Place 1 tablet (0.4 mg total) under the tongue every 5 (five) minutes x 3 doses as needed for chest pain.   Polyethyl Glycol-Propyl Glycol (SYSTANE) 0.4-0.3 % GEL ophthalmic gel Place 1 Application into both eyes daily as needed (For dry eyes).   sildenafil (VIAGRA) 100 MG tablet Take 100 mg by mouth daily as needed.   Testosterone 1.62 % GEL PLACE 1 PUMP ONTO THE SKIN DAILY.   [DISCONTINUED] amLODipine (NORVASC) 10 MG tablet Take 1 tablet by mouth once daily   [DISCONTINUED] clopidogrel (PLAVIX) 75 MG tablet Take 75 mg by mouth daily.   [DISCONTINUED] ezetimibe (ZETIA) 10 MG tablet Take 1 tablet by mouth once daily    [DISCONTINUED] metFORMIN (GLUCOPHAGE-XR) 500 MG 24 hr tablet Take 1 tablet (500 mg total) by mouth daily with breakfast. (Patient taking differently: Take 500 mg by mouth 2 (two) times daily with a meal.)   [DISCONTINUED] metoprolol tartrate (LOPRESSOR) 100 MG tablet Take 1 tablet (100 mg total) by mouth 2 (two) times daily. NEED OV.   [DISCONTINUED] mirtazapine (REMERON) 15 MG tablet Take 1 tablet (15 mg total) by mouth at bedtime.   [DISCONTINUED] traZODone (DESYREL) 50 MG tablet Take 1 tablet (50 mg total) by mouth at bedtime as needed for sleep.   amLODipine (NORVASC) 10 MG tablet Take 1 tablet (10 mg total) by mouth daily.   clopidogrel (PLAVIX) 75 MG tablet Take 1 tablet (75 mg total) by mouth daily.   ezetimibe (ZETIA) 10 MG tablet Take 1 tablet (10 mg  total) by mouth daily.   metFORMIN (GLUCOPHAGE-XR) 500 MG 24 hr tablet Take 1 tablet (500 mg total) by mouth daily with breakfast.   metoprolol tartrate (LOPRESSOR) 100 MG tablet Take 1 tablet (100 mg total) by mouth 2 (two) times daily. NEED OV.   mirtazapine (REMERON) 15 MG tablet Take 1 tablet (15 mg total) by mouth at bedtime.   Probiotic Product (RESTORA) CAPS Take 1 capsule by mouth daily. (Patient not taking: Reported on 01/18/2023)   traZODone (DESYREL) 50 MG tablet Take 1 tablet (50 mg total) by mouth at bedtime as needed for sleep.   [DISCONTINUED] dicyclomine (BENTYL) 20 MG tablet Take 1 tablet (20 mg total) by mouth every 6 (six) hours as needed for up to 7 days for spasms.   [DISCONTINUED] famotidine (PEPCID) 20 MG tablet Take 1 tablet (20 mg total) by mouth 2 (two) times daily.   [DISCONTINUED] metoCLOPramide (REGLAN) 10 MG tablet Take 1 tablet (10 mg total) by mouth 3 (three) times daily with meals. (Patient not taking: Reported on 01/18/2023)   [DISCONTINUED] ondansetron (ZOFRAN-ODT) 4 MG disintegrating tablet Take 1 tablet (4 mg total) by mouth every 8 (eight) hours as needed for nausea or vomiting. (Patient not taking: Reported on  01/18/2023)   [DISCONTINUED] pantoprazole (PROTONIX) 40 MG tablet Take 1 tablet (40 mg total) by mouth daily.   No facility-administered encounter medications on file as of 02/15/2023.     Review of Systems  Review of Systems  Constitutional: Negative.   HENT: Negative.    Cardiovascular: Negative.   Gastrointestinal: Negative.   Allergic/Immunologic: Negative.   Neurological: Negative.   Psychiatric/Behavioral: Negative.         Physical Exam  BP 133/80   Pulse 60   Temp (!) 97.1 F (36.2 C)   Wt 194 lb 6.4 oz (88.2 kg)   SpO2 100%   BMI 29.56 kg/m   Wt Readings from Last 5 Encounters:  02/15/23 194 lb 6.4 oz (88.2 kg)  01/18/23 191 lb 8 oz (86.9 kg)  11/16/22 197 lb (89.4 kg)  11/10/22 185 lb (83.9 kg)  11/06/22 185 lb (83.9 kg)     Physical Exam Vitals and nursing note reviewed.  Constitutional:      General: He is not in acute distress.    Appearance: He is well-developed.  Cardiovascular:     Rate and Rhythm: Normal rate and regular rhythm.  Pulmonary:     Effort: Pulmonary effort is normal.     Breath sounds: Normal breath sounds.  Skin:    General: Skin is warm and dry.  Neurological:     Mental Status: He is alert and oriented to person, place, and time.      Lab Results:  CBC    Component Value Date/Time   WBC 6.1 11/10/2022 0928   RBC 5.64 11/10/2022 0928   HGB 15.7 11/10/2022 0928   HGB 14.3 12/04/2021 0850   HCT 49.2 11/10/2022 0928   HCT 43.0 12/04/2021 0850   PLT 209 11/10/2022 0928   PLT 240 12/04/2021 0850   MCV 87.2 11/10/2022 0928   MCV 85 12/04/2021 0850   MCH 27.8 11/10/2022 0928   MCHC 31.9 11/10/2022 0928   RDW 12.4 11/10/2022 0928   RDW 12.8 12/04/2021 0850   LYMPHSABS 0.7 11/07/2022 0240   LYMPHSABS 1.9 10/31/2020 1230   MONOABS 0.4 11/07/2022 0240   EOSABS 0.1 11/07/2022 0240   EOSABS 0.3 10/31/2020 1230   BASOSABS 0.0 11/07/2022 0240   BASOSABS 0.1 10/31/2020  1230    BMET    Component Value Date/Time    NA 138 11/10/2022 0928   NA 140 12/04/2021 0850   K 4.1 11/10/2022 0928   CL 100 11/10/2022 0928   CO2 29 11/10/2022 0928   GLUCOSE 243 (H) 11/10/2022 0928   BUN 13 11/10/2022 0928   BUN 14 12/04/2021 0850   CREATININE 1.00 11/10/2022 0928   CREATININE 0.87 12/08/2016 0900   CALCIUM 9.2 11/10/2022 0928   GFRNONAA >60 11/10/2022 0928   GFRNONAA >89 12/08/2016 0900   GFRAA >60 03/24/2020 0750   GFRAA >89 12/08/2016 0900    BNP    Component Value Date/Time   BNP 31.4 09/28/2019 0959     Assessment & Plan:   DM2 (diabetes mellitus, type 2) ([name]) - metFORMIN (GLUCOPHAGE-XR) 500 MG 24 hr tablet; Take 1 tablet (500 mg total) by mouth daily with breakfast.  Dispense: 30 tablet; Refill: 2 - POCT glycosylated hemoglobin (Hb A1C)  2. Hyperlipidemia, unspecified hyperlipidemia type  - ezetimibe (ZETIA) 10 MG tablet; Take 1 tablet (10 mg total) by mouth daily.  Dispense: 90 tablet; Refill: 0  3. Alcohol abuse  - Ambulatory referral to Psychiatry  4. Anxiety and depression  - Ambulatory referral to Psychiatry  -hand out given for patient for walk-in for alcoholic detox at [facility]  Follow up:  Follow up in 3 months     Ivonne Andrew, NP 02/15/2023

## 2023-02-15 NOTE — Assessment & Plan Note (Signed)
-   metFORMIN (GLUCOPHAGE-XR) 500 MG 24 hr tablet; Take 1 tablet (500 mg total) by mouth daily with breakfast.  Dispense: 30 tablet; Refill: 2 - POCT glycosylated hemoglobin (Hb A1C)  2. Hyperlipidemia, unspecified hyperlipidemia type  - ezetimibe (ZETIA) 10 MG tablet; Take 1 tablet (10 mg total) by mouth daily.  Dispense: 90 tablet; Refill: 0  3. Alcohol abuse  - Ambulatory referral to Psychiatry  4. Anxiety and depression  - Ambulatory referral to Psychiatry  -hand out given for patient for walk-in for alcoholic detox at [facility]  Follow up:  Follow up in 3 months

## 2023-02-15 NOTE — ED Notes (Signed)
Pt is in his room reading a book.  

## 2023-02-15 NOTE — Progress Notes (Signed)
   02/15/23 1926  [facility] Triage Screening (Walk-ins at [facility] only)  How Did You Hear About [place]? Self  What Is the Reason for Your Visit/Call Today? Patient presents to [facility] voluntarily seeking detox and alcohol. Patient reports struggling with alcohol for his whole adult life. Patient denies SI/HI at this time. Patient does not endorse AVH at this time. Patient reports consuming alcohol earlier this morning. Patien is routine.  How Long Has This Been Causing You Problems? > than 6 months  Have You Recently Had Any Thoughts About Hurting Yourself? No  Are You Planning to Commit Suicide/Harm Yourself At This time? No  Have you Recently Had Thoughts About Hurting Someone Karolee Ohs? No  Are You Planning To Harm Someone At This Time? No  Have You Used Any Alcohol or Drugs in the Past 24 Hours? Yes  How long ago did you use Drugs or Alcohol? Earlier this morning- Alcohol  What Did You Use and How Much? a bottle of wine and 4 beers.  Do you have any current medical co-morbidities that require immediate attention? Yes  Please describe current medical co-morbidities that require immediate attention: Heart disease and type two diabetes.  Clinician description of patient physical appearance/behavior: Patient is farily groomed and is calm and cooperative.  What Do You Feel Would Help You the Most Today? Alcohol or Drug Use Treatment  If access to [facility] was not available, would you have sought care in the Emergency Department? Yes  Determination of Need Routine (7 days)  Options For Referral Facility-Based Crisis

## 2023-02-15 NOTE — ED Triage Notes (Signed)
Patient presents to [facility] voluntarily seeking detox for alcohol use. Patient reports struggling with alcohol for his whole adult life. Patient denies SI/HI at this time. Patient does not endorse AVH at this time. Patient reports consuming alcohol earlier this morning. Patien is routine.

## 2023-02-16 DIAGNOSIS — F419 Anxiety disorder, unspecified: Secondary | ICD-10-CM | POA: Diagnosis present

## 2023-02-16 DIAGNOSIS — F1010 Alcohol abuse, uncomplicated: Secondary | ICD-10-CM | POA: Diagnosis not present

## 2023-02-16 DIAGNOSIS — F1090 Alcohol use, unspecified, uncomplicated: Secondary | ICD-10-CM

## 2023-02-16 DIAGNOSIS — Z79899 Other long term (current) drug therapy: Secondary | ICD-10-CM | POA: Diagnosis not present

## 2023-02-16 DIAGNOSIS — G479 Sleep disorder, unspecified: Secondary | ICD-10-CM | POA: Diagnosis present

## 2023-02-16 DIAGNOSIS — F411 Generalized anxiety disorder: Secondary | ICD-10-CM | POA: Diagnosis not present

## 2023-02-16 MED ORDER — LORAZEPAM 1 MG PO TABS: 1.0000 mg | ORAL_TABLET | Freq: Four times a day (QID) | ORAL | Status: DC | PRN

## 2023-02-16 MED ORDER — LORAZEPAM 2 MG/ML IJ SOLN: 1.0000 mg | INTRAMUSCULAR | Status: DC | PRN

## 2023-02-16 MED ORDER — POLYETHYLENE GLYCOL 3350 17 G PO PACK: 17.0000 g | PACK | Freq: Every day | ORAL | Status: DC | PRN

## 2023-02-16 MED ORDER — BISMUTH SUBSALICYLATE 262 MG PO CHEW: 524.0000 mg | CHEWABLE_TABLET | ORAL | Status: DC | PRN

## 2023-02-16 MED ORDER — HYDROXYZINE HCL 25 MG PO TABS: 25.0000 mg | ORAL_TABLET | Freq: Three times a day (TID) | ORAL | Status: DC | PRN

## 2023-02-16 MED ORDER — SERTRALINE HCL 50 MG PO TABS
50.0000 mg | ORAL_TABLET | Freq: Every day | ORAL | Status: DC
  Administered 2023-02-16 – 2023-02-19 (×4): 50 mg via ORAL
  Filled 2023-02-16 (×4): qty 1

## 2023-02-16 MED ORDER — ALUM & MAG HYDROXIDE-SIMETH 200-200-20 MG/5ML PO SUSP: 30.0000 mL | ORAL | Status: DC | PRN

## 2023-02-16 MED ORDER — SENNA 8.6 MG PO TABS: 1.0000 | ORAL_TABLET | Freq: Every evening | ORAL | Status: DC | PRN

## 2023-02-16 MED ORDER — ACETAMINOPHEN 325 MG PO TABS
650.0000 mg | ORAL_TABLET | Freq: Four times a day (QID) | ORAL | Status: DC | PRN
  Administered 2023-02-17: 650 mg via ORAL
  Filled 2023-02-16: qty 2

## 2023-02-16 MED ORDER — HALOPERIDOL 5 MG PO TABS: 5.0000 mg | ORAL_TABLET | Freq: Four times a day (QID) | ORAL | Status: DC | PRN

## 2023-02-16 MED ORDER — DIPHENHYDRAMINE HCL 25 MG PO CAPS: 25.0000 mg | ORAL_CAPSULE | Freq: Four times a day (QID) | ORAL | Status: DC | PRN

## 2023-02-16 MED ORDER — LORAZEPAM 2 MG/ML IJ SOLN: 1.0000 mg | Freq: Four times a day (QID) | INTRAMUSCULAR | Status: DC | PRN

## 2023-02-16 MED ORDER — DIPHENHYDRAMINE HCL 50 MG/ML IJ SOLN: 25.0000 mg | Freq: Four times a day (QID) | INTRAMUSCULAR | Status: DC | PRN

## 2023-02-16 MED ORDER — LORAZEPAM 1 MG PO TABS: 1.0000 mg | ORAL_TABLET | ORAL | Status: DC | PRN

## 2023-02-16 MED ORDER — ONDANSETRON HCL 4 MG PO TABS: 8.0000 mg | ORAL_TABLET | Freq: Three times a day (TID) | ORAL | Status: DC | PRN

## 2023-02-16 MED ORDER — HALOPERIDOL LACTATE 5 MG/ML IJ SOLN: 5.0000 mg | Freq: Four times a day (QID) | INTRAMUSCULAR | Status: DC | PRN
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
## 2023-02-16 NOTE — Discharge Instructions (Addendum)
Base on the information you have provided and the presenting issue, outpatient services and resources have been recommended.  It is imperative that you follow through with treatment recommendations within 5-7 days from the of discharge to mitigate further risk to your safety and mental well-being. A list of referrals has been provided below to get you started.  You are not limited to the list provided.  In case of an urgent crisis, you may contact the Mobile Crisis Unit with [facility] at [phone].  The patient is able to follow up at [facility] [place] for the walk walk in clinic in the CD-IOP group.

## 2023-02-16 NOTE — Group Note (Signed)
Group Topic: Communication  Group Date: 02/16/2023 Start Time: 2000 End Time: 2030 Facilitators: Rae Lips B  Department: [facility]  Number of Participants: 2  Group Focus: activities of daily living skills, anxiety, check in, clarity of thought, communication, coping skills, daily focus, depression, and family Treatment Modality:  Leisure Development Interventions utilized were leisure development Purpose: enhance coping skills, express feelings, increase insight, and regain self-worth  Name: [name] Date of Birth: 08-Aug-1960  MR: [id number]    Level of Participation: Did not attend groups.  Quality of Participation: NA Interactions with others: NA Mood/Affect: NA Triggers (if applicable): NA Cognition: NA Progress: Other Response: NA Plan: patient will be encouraged to go to groups.   Patients Problems:  Patient Active Problem List   Diagnosis Date Noted   Anxiety disorder, unspecified 02/16/2023   Alcohol use disorder 02/16/2023   Sleep disorder, unspecified 02/16/2023   Alcohol abuse 02/15/2023   Alcohol dependence ([name]) 05/16/2022   Low energy 03/06/2022   Moderate alcohol dependence in early remission ([name]) 02/24/2022   Suicidal ideation 07/07/2021   Generalized anxiety disorder 07/07/2021   Other allergic rhinitis 09/09/2020   Adverse reaction to food, subsequent encounter 09/09/2020   History of bee sting allergy 09/09/2020   Alcohol dependence with alcohol-induced mood disorder ([name]) 03/19/2020   MDD (major depressive disorder), recurrent episode, severe ([name]) 03/18/2020   History of substance abuse ([name]) 01/04/2020   Severe 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 features ([name]) 12/10/2019   Tinea cruris 07/06/2019   Hemoglobin A1c less than 7.0% 07/06/2019   Muscle strain of wrist 04/05/2019   Pain in both wrists 04/05/2019   Anxiety 04/05/2019   Prediabetes 04/05/2019   MDD (major depressive disorder) 01/12/2019    Chest pain 10/25/2018   MDD (major depressive disorder), recurrent severe, without psychosis ([name]) 08/15/2018   Influenza A 07/17/2017   Lactic acidosis    Cough    Nausea and vomiting    Severe episode of recurrent major depressive disorder, without psychotic features ([name])    MDD (major depressive disorder), single episode, severe with psychotic features ([name]) 06/27/2017   MDD (major depressive disorder), severe ([name]) 06/23/2017   Essential hypertension 01/13/2016   Unstable angina ([name])    ED (erectile dysfunction) 02/15/2014   Atypical chest pain 12/14/2011   Allergy to contrast media (used for diagnostic x-rays) 05/12/2011   Dyslipidemia, goal LDL below 70 05/09/2011   DM2 (diabetes mellitus, type 2) ([name]) 07/10/2010   CAD S/P percutaneous coronary angioplasty 07/10/2010

## 2023-02-16 NOTE — ED Notes (Signed)
Pt was provided dinner.

## 2023-02-16 NOTE — ED Notes (Signed)
Patient is w/o s/s of active bleeding.

## 2023-02-16 NOTE — ED Notes (Signed)
 Pt was provided lunch

## 2023-02-16 NOTE — ED Notes (Signed)
Pt was provided breakfast.

## 2023-02-16 NOTE — ED Notes (Signed)
Pt is currently sleeping, no distress noted, environmental check complete, will continue to monitor patient for safety.  

## 2023-02-16 NOTE — Group Note (Signed)
Group Topic: Communication  Group Date: 02/16/2023 Start Time: 0900 End Time: 0945 Facilitators: Mayer Camel L, RN  Department: [facility]  Number of Participants: 5  Group Focus: communication Treatment Modality:  Individual Therapy Interventions utilized were patient education Purpose: medication education  Name: [name] Date of Birth: March 30, 1961  MR: [id number]    Level of Participation: active Quality of Participation: cooperative Interactions with others: gave feedback Mood/Affect: positive Triggers (if applicable): N/A Cognition: coherent/clear Progress: Gaining insight Response: medication compliant Plan: patient will be encouraged to continue taking medications as prescribed.  Patients Problems:  Patient Active Problem List   Diagnosis Date Noted   Anxiety disorder, unspecified 02/16/2023   Alcohol use disorder 02/16/2023   Sleep disorder, unspecified 02/16/2023   Alcohol abuse 02/15/2023   Alcohol dependence ([name]) 05/16/2022   Low energy 03/06/2022   Moderate alcohol dependence in early remission ([name]) 02/24/2022   Suicidal ideation 07/07/2021   Generalized anxiety disorder 07/07/2021   Other allergic rhinitis 09/09/2020   Adverse reaction to food, subsequent encounter 09/09/2020   History of bee sting allergy 09/09/2020   Alcohol dependence with alcohol-induced mood disorder ([name]) 03/19/2020   MDD (major depressive disorder), recurrent episode, severe ([name]) 03/18/2020   History of substance abuse ([name]) 01/04/2020   Severe 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 features ([name]) 12/10/2019   Tinea cruris 07/06/2019   Hemoglobin A1c less than 7.0% 07/06/2019   Muscle strain of wrist 04/05/2019   Pain in both wrists 04/05/2019   Anxiety 04/05/2019   Prediabetes 04/05/2019   MDD (major depressive disorder) 01/12/2019   Chest pain 10/25/2018   MDD (major depressive disorder), recurrent severe, without psychosis ([name])  08/15/2018   Influenza A 07/17/2017   Lactic acidosis    Cough    Nausea and vomiting    Severe episode of recurrent major depressive disorder, without psychotic features ([name])    MDD (major depressive disorder), single episode, severe with psychotic features ([name]) 06/27/2017   MDD (major depressive disorder), severe ([name]) 06/23/2017   Essential hypertension 01/13/2016   Unstable angina ([name])    ED (erectile dysfunction) 02/15/2014   Atypical chest pain 12/14/2011   Allergy to contrast media (used for diagnostic x-rays) 05/12/2011   Dyslipidemia, goal LDL below 70 05/09/2011   DM2 (diabetes mellitus, type 2) ([name]) 07/10/2010   CAD S/P percutaneous coronary angioplasty 07/10/2010

## 2023-02-16 NOTE — Group Note (Signed)
Group Topic: Relaxation  Group Date: 02/16/2023 Start Time: 1100 End Time: 1130 Facilitators: Londell Moh, NT  Department: [facility]  Number of Participants: 7  Group Focus: leisure skills and relaxation Treatment Modality:  Psychoeducation Interventions utilized were leisure development and patient education Purpose: increase insight   Name: [name] Date of Birth: 09/10/1960  MR: [id number]    Level of Participation: Pt did not attend group, stated they did not want to. Patients Problems:  Patient Active Problem List   Diagnosis Date Noted   Anxiety disorder, unspecified 02/16/2023   Alcohol use disorder 02/16/2023   Sleep disorder, unspecified 02/16/2023   Alcohol abuse 02/15/2023   Alcohol dependence ([name]) 05/16/2022   Low energy 03/06/2022   Moderate alcohol dependence in early remission ([name]) 02/24/2022   Suicidal ideation 07/07/2021   Generalized anxiety disorder 07/07/2021   Other allergic rhinitis 09/09/2020   Adverse reaction to food, subsequent encounter 09/09/2020   History of bee sting allergy 09/09/2020   Alcohol dependence with alcohol-induced mood disorder ([name]) 03/19/2020   MDD (major depressive disorder), recurrent episode, severe ([name]) 03/18/2020   History of substance abuse ([name]) 01/04/2020   Severe 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 features ([name]) 12/10/2019   Tinea cruris 07/06/2019   Hemoglobin A1c less than 7.0% 07/06/2019   Muscle strain of wrist 04/05/2019   Pain in both wrists 04/05/2019   Anxiety 04/05/2019   Prediabetes 04/05/2019   MDD (major depressive disorder) 01/12/2019   Chest pain 10/25/2018   MDD (major depressive disorder), recurrent severe, without psychosis ([name]) 08/15/2018   Influenza A 07/17/2017   Lactic acidosis    Cough    Nausea and vomiting    Severe episode of recurrent major depressive disorder, without psychotic features ([name])    MDD (major depressive disorder),  single episode, severe with psychotic features ([name]) 06/27/2017   MDD (major depressive disorder), severe ([name]) 06/23/2017   Essential hypertension 01/13/2016   Unstable angina ([name])    ED (erectile dysfunction) 02/15/2014   Atypical chest pain 12/14/2011   Allergy to contrast media (used for diagnostic x-rays) 05/12/2011   Dyslipidemia, goal LDL below 70 05/09/2011   DM2 (diabetes mellitus, type 2) ([name]) 07/10/2010   CAD S/P percutaneous coronary angioplasty 07/10/2010

## 2023-02-16 NOTE — ED Notes (Signed)
Patient A&Ox4. Patient denies SI/HI and AVH. Denies any physical complaints when asked. No acute distress noted. Support and encouragement provided. Routine safety checks conducted according to facility protocol. Encouraged patient to notify staff if thoughts of harm toward self or others arise. Patient verbalize understanding and agreement. Will continue to monitor for safety.

## 2023-02-16 NOTE — ED Notes (Signed)
Brought pt an extra pillow for his leg.

## 2023-02-17 DIAGNOSIS — F1010 Alcohol abuse, uncomplicated: Secondary | ICD-10-CM | POA: Diagnosis not present

## 2023-02-17 DIAGNOSIS — Z79899 Other long term (current) drug therapy: Secondary | ICD-10-CM | POA: Diagnosis not present

## 2023-02-17 DIAGNOSIS — F411 Generalized anxiety disorder: Secondary | ICD-10-CM | POA: Diagnosis not present

## 2023-02-17 NOTE — Group Note (Signed)
Group Topic: Relapse and Recovery  Group Date: 02/17/2023 Start Time: 2000 End Time: 2100 Facilitators: Rae Lips B  Department: [facility]  Number of Participants: 4  Group Focus: abuse issues, acceptance, and activities of daily living skills Treatment Modality:  Leisure Development Interventions utilized were leisure development Purpose: enhance coping skills, express feelings, and relapse prevention strategies  Name: [name] Date of Birth: 19-Mar-1961  MR: [id number]    Level of Participation: pt did not attend group. Quality of Participation: NA Interactions with others: NA Mood/Affect: NA Triggers (if applicable): NA Cognition: NA Progress: Other Response: NA Plan: patient will be encouraged to go to groups.  Patients Problems:  Patient Active Problem List   Diagnosis Date Noted   Anxiety disorder, unspecified 02/16/2023   Alcohol use disorder 02/16/2023   Sleep disorder, unspecified 02/16/2023   Alcohol abuse 02/15/2023   Alcohol dependence ([name]) 05/16/2022   Low energy 03/06/2022   Moderate alcohol dependence in early remission ([name]) 02/24/2022   Suicidal ideation 07/07/2021   Generalized anxiety disorder 07/07/2021   Other allergic rhinitis 09/09/2020   Adverse reaction to food, subsequent encounter 09/09/2020   History of bee sting allergy 09/09/2020   Alcohol dependence with alcohol-induced mood disorder ([name]) 03/19/2020   MDD (major depressive disorder), recurrent episode, severe ([name]) 03/18/2020   History of substance abuse ([name]) 01/04/2020   Severe 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 features ([name]) 12/10/2019   Tinea cruris 07/06/2019   Hemoglobin A1c less than 7.0% 07/06/2019   Muscle strain of wrist 04/05/2019   Pain in both wrists 04/05/2019   Anxiety 04/05/2019   Prediabetes 04/05/2019   MDD (major depressive disorder) 01/12/2019   Chest pain 10/25/2018   MDD (major depressive disorder), recurrent  severe, without psychosis ([name]) 08/15/2018   Influenza A 07/17/2017   Lactic acidosis    Cough    Nausea and vomiting    Severe episode of recurrent major depressive disorder, without psychotic features ([name])    MDD (major depressive disorder), single episode, severe with psychotic features ([name]) 06/27/2017   MDD (major depressive disorder), severe ([name]) 06/23/2017   Essential hypertension 01/13/2016   Unstable angina ([name])    ED (erectile dysfunction) 02/15/2014   Atypical chest pain 12/14/2011   Allergy to contrast media (used for diagnostic x-rays) 05/12/2011   Dyslipidemia, goal LDL below 70 05/09/2011   DM2 (diabetes mellitus, type 2) ([name]) 07/10/2010   CAD S/P percutaneous coronary angioplasty 07/10/2010

## 2023-02-17 NOTE — Care Management (Signed)
[facility] Care Management   Writer provided referral information for [facility] and [facility].   Patient reports that he would prefer to go to [facility] for [facility] group and psychiatric medication management.   Writer informed the patient that he would have to go to [facility] walk in clinic Monday thru Friday from 9a to 1p because he is a new patient.

## 2023-02-17 NOTE — ED Notes (Signed)
Pt is currently sleeping, no distress noted, environmental check complete, will continue to monitor patient for safety.  

## 2023-02-17 NOTE — ED Notes (Signed)
Patient observed/assessed at bedside lying in bed asleep. Patient alert and oriented to self and location. Patient denies pain and anxiety. He denies A/V/H. He denies having any thoughts/plan of self harm and harm towards others. Patient states that appetite has been good throughout the day. Verbalizes no further complaints at this time. Will continue to monitor and support.

## 2023-02-17 NOTE — Progress Notes (Signed)
Pt is awake, alert and oriented X3. Pt complained of shoulder pain. No signs of acute distress noted. PRN Acetaminophen and scheduled meds administered with no issue.  Pt denies current SI/HI/AVH, plan or intent. Staff will monitor for pt's safety.

## 2023-02-17 NOTE — ED Provider Notes (Signed)
Behavioral Health Progress Note  Date and Time: 02/17/2023 11:15 AM Name: [name] MRN:  [id number]  Subjective: [name] is a 62 year old male with a psychiatric history of alcohol abuse, GAD, and MDD who was admitted to the [facility] for alcohol detox.  Patient is assessed at bedside. Patient states that he slept "exceptionally well." He denies sleeping too much as he previously endorsed. Patient also reports that his appetite is normal. He denies experiencing any cravings for substances at this time. Patient denies experiencing any withdrawal symptoms at this time, reporting resolution of headache, anxiety, and restlessness. Patient further denies any anxiety today, rating anxiety at a 0/10, noting that he has experienced daily anxiety every morning for several months. He denies depressed mood. Patient describes current mood as "content." Patient denies SI/HI/AVH.   Patient does not endorse any adverse affects they attribute to medications including no nausea, diarrhea, dizziness, or fatigue.   Patient continues to complain of right shoulder pain, which improves with hot or cold compress.    Patient endorses no additional concerns or complaints at the time of today's encounter.   Diagnosis:  Final diagnoses:  None    Total Time spent with patient: 20 minutes   Past Psychiatric History: alcohol abuse, MDD, GAD Past Medical History: Type 2 diabetes mellitus, CAD s/p percutaneous coronary angioplasty, HTN,  Family History:  Father (deceased): prostate cancer Brother (deceased): liver cancer  Mother (deceased): brain cancer  Family Psychiatric  History: denies any family psychiatric history  Social History: Unemployed (on disability since myocardial infarction). Widowed (wife passed away from breast cancer about 3-4 years ago. 2 children (not nearby). No family in the area. Support system includes AA and church community (previous, not currently involved but wants to rejoin).   Substance Use:  Alcohol: 1 bottle of wine, 4 beers, +/- 1 pint of gin every 3-4 days (1 day of drinking followed by 2-3 days of recovery)  Tobacco: smokes about 10 cigarettes on days that he drinks alcohol  Marijuana: denies use Cocaine: uses about once every 3 months (most recent use 1 mo ago)  Amphetamine: denies use  Opioids: denies use     Sleep: Good  Sleep: Normal ("exceptionally well") Appetite: Normal  Current Medications: Current Facility-Administered Medications  Medication Dose Route Frequency Provider Last Rate Last Admin   acetaminophen (TYLENOL) tablet 650 mg  650 mg Oral Q6H PRN Augusto Gamble, MD   650 mg at 02/17/23 0932   alum & mag hydroxide-simeth (MAALOX/MYLANTA) 200-200-20 MG/5ML suspension 30 mL  30 mL Oral Q4H PRN Augusto Gamble, MD       amLODipine (NORVASC) tablet 10 mg  10 mg Oral Daily Sindy Guadeloupe, NP   10 mg at 02/17/23 4098   aspirin EC tablet 81 mg  81 mg Oral Daily Sindy Guadeloupe, NP   81 mg at 02/17/23 1191   bismuth subsalicylate (PEPTO BISMOL) chewable tablet 524 mg  524 mg Oral Q3H PRN Augusto Gamble, MD       cholecalciferol (VITAMIN D3) 25 MCG (1000 UNIT) tablet 5,000 Units  5,000 Units Oral Lestine Mount, NP   5,000 Units at 02/16/23 4782   clopidogrel (PLAVIX) tablet 75 mg  75 mg Oral Daily Sindy Guadeloupe, NP   75 mg at 02/17/23 9562   haloperidol (HALDOL) tablet 5 mg  5 mg Oral Q6H PRN Augusto Gamble, MD       And   LORazepam (ATIVAN) tablet 1 mg  1 mg Oral Q6H PRN Augusto Gamble, MD  And   diphenhydrAMINE (BENADRYL) capsule 25 mg  25 mg Oral Q6H PRN Augusto Gamble, MD       haloperidol lactate (HALDOL) injection 5 mg  5 mg Intramuscular Q6H PRN Augusto Gamble, MD       And   LORazepam (ATIVAN) injection 1 mg  1 mg Intravenous Q6H PRN Augusto Gamble, MD       And   diphenhydrAMINE (BENADRYL) injection 25 mg  25 mg Intramuscular Q6H PRN Augusto Gamble, MD       EPINEPHrine (EPI-PEN) injection 0.3 mg  0.3 mg Intramuscular PRN Sindy Guadeloupe, NP        ezetimibe (ZETIA) tablet 10 mg  10 mg Oral Daily Sindy Guadeloupe, NP   10 mg at 02/17/23 1610   hydrOXYzine (ATARAX) tablet 25 mg  25 mg Oral TID PRN Augusto Gamble, MD       LORazepam (ATIVAN) tablet 1 mg  1 mg Oral TID Sindy Guadeloupe, NP   1 mg at 02/17/23 9604   Followed by   LORazepam (ATIVAN) tablet 1 mg  1 mg Oral BID Sindy Guadeloupe, NP       Followed by   Melene Muller ON 02/19/2023] LORazepam (ATIVAN) tablet 1 mg  1 mg Oral Daily Sindy Guadeloupe, NP       LORazepam (ATIVAN) tablet 1 mg  1 mg Oral Q6H PRN Nelly Rout, MD       metFORMIN (GLUCOPHAGE-XR) 24 hr tablet 500 mg  500 mg Oral Q breakfast Sindy Guadeloupe, NP   500 mg at 02/17/23 5409   metoprolol tartrate (LOPRESSOR) tablet 100 mg  100 mg Oral BID Sindy Guadeloupe, NP   100 mg at 02/17/23 8119   mirtazapine (REMERON) tablet 15 mg  15 mg Oral Dorthey Sawyer, NP   15 mg at 02/16/23 2105   multivitamin with minerals tablet 1 tablet  1 tablet Oral Daily Sindy Guadeloupe, NP   1 tablet at 02/17/23 1478   multivitamin with minerals tablet 1 tablet  1 tablet Oral Daily Sindy Guadeloupe, NP   1 tablet at 02/17/23 0933   nitroGLYCERIN (NITROSTAT) SL tablet 0.4 mg  0.4 mg Sublingual Q5 Min x 3 PRN Sindy Guadeloupe, NP       ondansetron [facility]) tablet 8 mg  8 mg Oral Q8H PRN Augusto Gamble, MD       polyethylene glycol (MIRALAX / GLYCOLAX) packet 17 g  17 g Oral Daily PRN Augusto Gamble, MD       polyvinyl alcohol (LIQUIFILM TEARS) 1.4 % ophthalmic solution 1 drop  1 drop Both Eyes Daily PRN Sindy Guadeloupe, NP       senna (SENOKOT) tablet 8.6 mg  1 tablet Oral QHS PRN Augusto Gamble, MD       sertraline (ZOLOFT) tablet 50 mg  50 mg Oral Daily Augusto Gamble, MD   50 mg at 02/17/23 2956   thiamine (VITAMIN B1) tablet 100 mg  100 mg Oral Daily Sindy Guadeloupe, NP   100 mg at 02/17/23 2130   traZODone (DESYREL) tablet 50 mg  50 mg Oral QHS PRN Sindy Guadeloupe, NP       Current Outpatient Medications  Medication Sig Dispense Refill   amLODipine (NORVASC) 10 MG tablet Take 1 tablet  (10 mg total) by mouth daily. 90 tablet 0   aspirin 81 MG EC tablet Take 1 tablet (81 mg total) by mouth daily. (May buy from over the counter): Swallow whole for heart health (Patient taking differently: Take 81 mg by mouth  at bedtime. (May buy from over the counter): Swallow whole for heart health) 30 tablet 0   Cholecalciferol 25 MCG (1000 UT) tablet Take 1,000 Units by mouth daily.     clopidogrel (PLAVIX) 75 MG tablet Take 1 tablet (75 mg total) by mouth daily. 30 tablet 2   EPINEPHrine 0.3 mg/0.3 mL IJ SOAJ injection Inject 0.3 mg into the muscle as needed for anaphylaxis. 1 each 2   ezetimibe (ZETIA) 10 MG tablet Take 1 tablet (10 mg total) by mouth daily. 90 tablet 0   glucose blood (ACCU-CHEK GUIDE) test strip USE 1 STRIP TO CHECK GLUCOSE THREE TIMES DAILY AS DIRECTED 300 each 0   ibuprofen (ADVIL) 800 MG tablet Take 1 tablet (800 mg total) by mouth every 8 (eight) hours as needed. (Patient taking differently: Take 800 mg by mouth every 8 (eight) hours as needed for mild pain.) 30 tablet 0   LIVALO 4 MG TABS Take 1 tablet by mouth once daily 90 tablet 3   metFORMIN (GLUCOPHAGE-XR) 500 MG 24 hr tablet Take 1 tablet (500 mg total) by mouth daily with breakfast. 30 tablet 2   metoprolol tartrate (LOPRESSOR) 100 MG tablet Take 1 tablet (100 mg total) by mouth 2 (two) times daily. NEED OV. 180 tablet 0   mirtazapine (REMERON) 15 MG tablet Take 1 tablet (15 mg total) by mouth at bedtime. 30 tablet 0   Multiple Vitamin (MULTIVITAMIN WITH MINERALS) TABS tablet Take 1 tablet by mouth daily.     nitroGLYCERIN (NITROSTAT) 0.4 MG SL tablet Place 1 tablet (0.4 mg total) under the tongue every 5 (five) minutes x 3 doses as needed for chest pain. 25 tablet 3   Polyethyl Glycol-Propyl Glycol (SYSTANE) 0.4-0.3 % GEL ophthalmic gel Place 1 Application into both eyes daily as needed (For dry eyes).     Probiotic Product (RESTORA) CAPS Take 1 capsule by mouth daily. 30 capsule 2   sildenafil (VIAGRA) 100 MG  tablet Take 100 mg by mouth daily as needed for erectile dysfunction.     Testosterone 1.62 % GEL PLACE 1 PUMP ONTO THE SKIN DAILY. (Patient taking differently: Apply 1 Pump topically daily.) 75 g 0   traZODone (DESYREL) 50 MG tablet Take 1 tablet (50 mg total) by mouth at bedtime as needed for sleep. (Patient taking differently: Take 50 mg by mouth at bedtime.) 30 tablet 0   hydrOXYzine (ATARAX) 25 MG tablet Take 25 mg by mouth 3 (three) times daily as needed for anxiety. (Patient not taking: Reported on 02/16/2023)      Labs  Lab Results:     Latest Ref Rng & Units 02/15/2023    9:13 PM 11/10/2022    9:28 AM 11/07/2022    2:40 AM  CBC  WBC 4.0 - 10.5 K/uL 6.4  6.1  4.9   Hemoglobin 13.0 - 17.0 g/dL 82.9  56.2  13.0   Hematocrit 39.0 - 52.0 % 42.5  49.2  45.4   Platelets 150 - 400 K/uL 208  209  196       Latest Ref Rng & Units 02/15/2023    9:13 PM 11/10/2022    9:28 AM 11/07/2022    2:39 AM  CMP  Glucose 70 - 99 mg/dL 865  784  696   BUN 8 - 23 mg/dL 14  13  11    Creatinine 0.61 - 1.24 mg/dL 2.95  2.84  1.32   Sodium 135 - 145 mmol/L 139  138  140   Potassium 3.5 -  5.1 mmol/L 4.2  4.1  4.0   Chloride 98 - 111 mmol/L 105  100  108   CO2 22 - 32 mmol/L 21  29  24    Calcium 8.9 - 10.3 mg/dL 9.7  9.2  8.6   Total Protein 6.5 - 8.1 g/dL 6.5  6.8  6.3   Total Bilirubin 0.3 - 1.2 mg/dL 0.4  0.4  0.4   Alkaline Phos 38 - 126 U/L 61  69  65   AST 15 - 41 U/L 32  23  21   ALT 0 - 44 U/L 83  38  38     Blood Alcohol level:  Lab Results  Component Value Date   ETH <10 02/15/2023   ETH <10 08/07/2022   Metabolic Disorder Labs: Lab Results  Component Value Date   HGBA1C 7.2 (H) 02/15/2023   MPG 159.94 02/15/2023   MPG 142.72 08/07/2022   Lab Results  Component Value Date   PROLACTIN 13.0 03/17/2020   Lab Results  Component Value Date   CHOL 154 02/15/2023   TRIG 219 (H) 02/15/2023   HDL 43 02/15/2023   CHOLHDL 3.6 02/15/2023   VLDL 44 (H) 02/15/2023   LDLCALC 67  02/15/2023   LDLCALC 56 11/18/2022   Therapeutic Lab Levels: No results found for: "LITHIUM" No results found for: "VALPROATE" No results found for: "CBMZ" Physical Findings   AIMS    Flowsheet Row Ad[facility]Discharged) [facility] (Discharged) from [facility] Ad[facility] (Discharged) from [facility] Admission (Discharged) from [facility] Ad[facility] (Dis[facility]) [facility]  AIMS Total Score 0 0 0 0 0      AUDIT    Flowsheet Row Office Visit from 11/16/2022 in [facility] Office Visit from 08/27/2020 in [facility] Admission (Discharged) from [facility] (Discharged[facility] Ad[facility] (Discharged) from [facility]  Alcohol Use Disorder Identification Test Final Score (AUDIT) 24 27 28 13 25       GAD-7    Flowsheet Row Office Visit from 02/15/2023 in [facility] Clinical Support from 02/24/2022 in [facility] Office Visit from 01/08/2022 in [facility] Video Visit from 11/19/2021 in [facility] Clinical Support from 07/07/2021 in [facility]  Total GAD-7 Score 10 2 12 13 19       PHQ2-9    Flowsheet Row ED from 02/15/2023 in [facility] Most recent reading at 02/15/2023  9:08 PM Office Visit from 02/15/2023 in [facility] Most recent reading at 02/15/2023  8:21 AM Office Visit from  11/16/2022 in [facility] Most recent reading at 11/16/2022  3:58 PM ED from 08/07/2022 in [facility] Most recent reading at 08/12/2022  2:08 PM ED from 05/16/2022 in [facility] Most recent reading at 05/19/2022  1:59 PM  PHQ-2 Total Score 5 6 6 2 3   PHQ-9 Total Score 17 17 11 10 12       Flowsheet Row  ED from 02/15/2023 in [facility] Most recent reading at 02/16/2023 12:52 PM ED from 02/15/2023 in [facility] Most recent reading at 02/15/2023  7:32 PM ED from 11/10/2022 in [facility] Department [facility] Most recent reading at 11/10/2022  9:27 AM  C-SSRS RISK CATEGORY No Risk Low Risk No Risk       Musculoskeletal  Strength & Muscle Tone: within normal limits Gait & Station: normal Patient leans: N/A  Psychiatric Specialty Exam  Presentation  General Appearance: Appropriate for Environment; Casual; Well Groomed; Neat (Patient appears stated age.)   Eye Contact:Good   Speech:Normal Rate; Clear and Coherent   Speech Volume:Normal   Behavior: Appropriate to setting, calm and cooperative    Social relatedness: Adult nurse, engaged, pleasant    Handedness:Right   Mood and Affect  Mood:-- ("Content")   Affect:Appropriate   Thought Process  Thought Processes:Coherent; Goal Directed; Linear   Descriptions of Associations:Circumstantial   Orientation:Full (Time, Place and Person)   Thought Content:Abstract Reasoning; Logical  Diagnosis of Schizophrenia or Schizoaffective disorder in past: No    Hallucinations:Hallucinations: None    Ideas of Reference:None   Suicidal Thoughts:Suicidal Thoughts: No    Homicidal Thoughts:Homicidal Thoughts: No    Sensorium  Memory:Immediate Good; Recent Good; Remote Good   Judgment:Good   Insight:-- (Excellent)   Executive Functions   Concentration:Good   Attention Span:Good   Recall:Good   Fund of Knowledge:Good   Language:Good   Psychomotor Activity  Psychomotor Activity:Psychomotor Activity: Normal    Assets  Assets:Communication Skills; Desire for Improvement; Resilience (Coping mechanisms: Exercise, healthy diet, spending time outside, journaling and self-reflection/self-awareness, friendliness, humor)   Sleep  Sleep:Sleep: Good    No data recorded   Physical Exam  Physical Exam Constitutional:      General: He is not in acute distress.    Appearance: Normal appearance. He is not ill-appearing, toxic-appearing or diaphoretic.  HENT:     Head: Normocephalic and atraumatic.  Eyes:     Extraocular Movements: Extraocular movements intact.  Pulmonary:     Effort: Pulmonary effort is normal.  Musculoskeletal:        General: Normal range of motion.     Cervical back: Normal range of motion.  Neurological:     General: No focal deficit present.     Mental Status: He is alert and oriented to person, place, and time.  Psychiatric:        Attention and Perception: Attention normal.        Mood and Affect: Mood normal.        Speech: Speech normal.        Behavior: Behavior normal. Behavior is cooperative.        Thought Content: Thought content normal.        Cognition and Memory: Cognition and memory normal.        Judgment: Judgment normal.    Review of Systems  Constitutional:  Negative for chills, diaphoresis, fever and malaise/fatigue.  Respiratory:  Negative for shortness of breath.   Cardiovascular:  Negative for chest pain and palpitations.  Gastrointestinal:  Negative for abdominal pain, constipation, diarrhea, nausea and vomiting.  Musculoskeletal:  Negative for myalgias.       Right shoulder pain   Neurological:  Negative for tremors, speech change, seizures, loss of consciousness and headaches (mild).  Psychiatric/Behavioral:  Positive for substance abuse (prior to  admission). Negative for suicidal ideas. The patient is not nervous/anxious and does not have  insomnia.    Blood pressure (!) 137/94, pulse 71, temperature 99 F (37.2 C), resp. rate 16, SpO2 99%. There is no height or weight on file to calculate BMI.   Medical Decision Making:  [name] is a 62 year old male with a psychiatric history of alcohol abuse, anxiety, and MDD who presented to the [facility] for alcohol use disorder and anxiety. During his admission, patient denied cravings for alcohol but described withdrawal symptoms including mild headaches, anxiety, and restlessness. Anxiety remained as a significant concern for the patient, which he reported to have occurred daily since January. Patient was started on mirtazapine 15 mg nightly for sleep, which proved to be beneficial. Patient additionally was started on Zoloft 50 mg daily for anxiety, which he tolerated well. He endorses sleeping too much in the days and weeks prior to admission, which resolved during his time in the [facility] in the absence of alcohol use. Patient remained calm, cooperative, and stable throughout admission. Sleep disturbances were not further assessed during admission. Patient was goal-oriented with good judgment. Patient assessed to have excellent insight and be very self-aware. He already utilizes several healthy coping mechanisms including exercise, healthy diet, and journaling. He is interested in learning anxiety management skills moving forward. Patient agreeable to IOP. Social work discussed options including [facility] [place] and [facility]. Patient prefers to go to [facility] [place] as he felt that this was effective for him in the past.    ASSESSMENT & PLAN:  Daily contact with patient to assess and evaluate symptoms and progress in treatment and Medication management:  Alcohol use disorder, recurrent Alcohol withdrawal  Continue acetaminophen 650 mg Q6 hours PRN mild  pain  Safety checks every 15 min  CIWA protocol with as needed lorazepam protocol  Continue PRNs for agitation: Zyprexa 10 mg Q8 hours, ativan 1 mg, Geodon 20 mg IM  Unspecified anxiety disorder Continue Atarax 25 mg Q6 hours PRN anxiety/agitation Continue Zoloft 50 mg daily for anxiety   Unspecified sleep-wake disorder Continue Trazodone 50 mg PRN for sleep  Continue Remeron 15 mg daily at bedtime  Medication regimen: aspirin 81 mg and clopidogrel 75 mg daily for primary MI prophylaxis, amlodipine 10 mg daily for HTN, metformin 500 mg daily for diabetes  Patient does not need nicotine replacement  PRNs:               -- continue acetaminophen 650 mg every 6 hours as needed for mild to moderate pain, fever, and headaches              -- continue hydroxyzine 25 mg three times a day as needed for anxiety              -- continue bismuth subsalicylate 524 mg oral chewable tablet every 3 hours as needed for diarrhea / loose stools              -- continue senna 8.6 mg oral at bedtime and polyethylene glycol 17 g oral daily as needed for mild to moderate constipation              -- continue ondansetron 8 mg every 8 hours as needed for nausea or vomiting              -- continue aluminum-magnesium hydroxide + simethicone 30 mL every 4 hours as needed for heartburn or indigestion              -- continue trazodone 50  mg at bedtime as needed for insomnia  Provided motivational interviewing. Patient encouraged to rejoin AA and church group as these seemed to be effective support systems for the patient in the past.   Disposition: At this time, patient plans to go to [facility] [place] upon discharge. He was informed of the walk-in clinic Mon-Fri 9 am-1 pm. Discharge pending completion of detox, patient agreeable.   Keene Breath, Medical Student 02/17/23 11:12 AM   Augusto Gamble, MD 02/17/23 11:15 AM

## 2023-02-17 NOTE — ED Notes (Signed)
Patient is sleeping. Respirations equal and unlabored, skin warm and dry. No change in assessment or acuity. Routine safety checks conducted according to facility protocol. Will continue to monitor for safety.   

## 2023-02-17 NOTE — Progress Notes (Signed)
Pt slept most of the shift but woke up for meals. No distress noted or concerns voiced. Staff will monitor for pt's safety.

## 2023-02-17 NOTE — Progress Notes (Signed)
Pt's CIWA was 0. 

## 2023-02-17 NOTE — Group Note (Signed)
Group Topic: Emotional Regulation  Group Date: 02/17/2023 Start Time: 1130 End Time: 1145 Facilitators: Priscille Kluver, NT  Department: [facility]  Number of Participants: 0   Name: [name] Date of Birth: Oct 07, 1960  MR: [id number]    Level of Participation: Level of Participation: Pt did not attend group  Patients Problems:  Patient Active Problem List   Diagnosis Date Noted   Anxiety disorder, unspecified 02/16/2023   Alcohol use disorder 02/16/2023   Sleep disorder, unspecified 02/16/2023   Alcohol abuse 02/15/2023   Alcohol dependence ([name]) 05/16/2022   Low energy 03/06/2022   Moderate alcohol dependence in early remission ([name]) 02/24/2022   Suicidal ideation 07/07/2021   Generalized anxiety disorder 07/07/2021   Other allergic rhinitis 09/09/2020   Adverse reaction to food, subsequent encounter 09/09/2020   History of bee sting allergy 09/09/2020   Alcohol dependence with alcohol-induced mood disorder ([name]) 03/19/2020   MDD (major depressive disorder), recurrent episode, severe ([name]) 03/18/2020   History of substance abuse ([name]) 01/04/2020   Severe 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 features ([name]) 12/10/2019   Tinea cruris 07/06/2019   Hemoglobin A1c less than 7.0% 07/06/2019   Muscle strain of wrist 04/05/2019   Pain in both wrists 04/05/2019   Anxiety 04/05/2019   Prediabetes 04/05/2019   MDD (major depressive disorder) 01/12/2019   Chest pain 10/25/2018   MDD (major depressive disorder), recurrent severe, without psychosis ([name]) 08/15/2018   Influenza A 07/17/2017   Lactic acidosis    Cough    Nausea and vomiting    Severe episode of recurrent major depressive disorder, without psychotic features ([name])    MDD (major depressive disorder), single episode, severe with psychotic features ([name]) 06/27/2017   MDD (major depressive disorder), severe ([name]) 06/23/2017   Essential hypertension 01/13/2016   Unstable  angina ([name])    ED (erectile dysfunction) 02/15/2014   Atypical chest pain 12/14/2011   Allergy to contrast media (used for diagnostic x-rays) 05/12/2011   Dyslipidemia, goal LDL below 70 05/09/2011   DM2 (diabetes mellitus, type 2) ([name]) 07/10/2010   CAD S/P percutaneous coronary angioplasty 07/10/2010

## 2023-02-17 NOTE — Group Note (Signed)
Group Topic: Relaxation  Group Date: 02/17/2023 Start Time: 1030 End Time: 1035 Facilitators: Dickie La, RN  Department: [facility]  Number of Participants: 0  Group Focus: relaxation Treatment Modality:  Eclectic Therapy Interventions utilized were none Purpose: Dog therapy  Name: [name] Date of Birth: 1960-12-03  MR: [id number]    Level of Participation: Pt did not attend dog therapy. Quality of Participation: N/A Interactions with others: N/A Mood/Affect: N/A Triggers (if applicable): N/A Cognition: N/A Progress: N/A Response: N/A Plan: follow-up needed  Patients Problems:  Patient Active Problem List   Diagnosis Date Noted   Anxiety disorder, unspecified 02/16/2023   Alcohol use disorder 02/16/2023   Sleep disorder, unspecified 02/16/2023   Alcohol abuse 02/15/2023   Alcohol dependence ([name]) 05/16/2022   Low energy 03/06/2022   Moderate alcohol dependence in early remission ([name]) 02/24/2022   Suicidal ideation 07/07/2021   Generalized anxiety disorder 07/07/2021   Other allergic rhinitis 09/09/2020   Adverse reaction to food, subsequent encounter 09/09/2020   History of bee sting allergy 09/09/2020   Alcohol dependence with alcohol-induced mood disorder ([name]) 03/19/2020   MDD (major depressive disorder), recurrent episode, severe ([name]) 03/18/2020   History of substance abuse ([name]) 01/04/2020   Severe 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 features ([name]) 12/10/2019   Tinea cruris 07/06/2019   Hemoglobin A1c less than 7.0% 07/06/2019   Muscle strain of wrist 04/05/2019   Pain in both wrists 04/05/2019   Anxiety 04/05/2019   Prediabetes 04/05/2019   MDD (major depressive disorder) 01/12/2019   Chest pain 10/25/2018   MDD (major depressive disorder), recurrent severe, without psychosis ([name]) 08/15/2018   Influenza A 07/17/2017   Lactic acidosis    Cough    Nausea and vomiting    Severe episode of recurrent major  depressive disorder, without psychotic features ([name])    MDD (major depressive disorder), single episode, severe with psychotic features ([name]) 06/27/2017   MDD (major depressive disorder), severe ([name]) 06/23/2017   Essential hypertension 01/13/2016   Unstable angina ([name])    ED (erectile dysfunction) 02/15/2014   Atypical chest pain 12/14/2011   Allergy to contrast media (used for diagnostic x-rays) 05/12/2011   Dyslipidemia, goal LDL below 70 05/09/2011   DM2 (diabetes mellitus, type 2) ([name]) 07/10/2010   CAD S/P percutaneous coronary angioplasty 07/10/2010

## 2023-02-18 DIAGNOSIS — F411 Generalized anxiety disorder: Secondary | ICD-10-CM | POA: Diagnosis not present

## 2023-02-18 DIAGNOSIS — F1010 Alcohol abuse, uncomplicated: Secondary | ICD-10-CM | POA: Diagnosis not present

## 2023-02-18 DIAGNOSIS — Z79899 Other long term (current) drug therapy: Secondary | ICD-10-CM | POA: Diagnosis not present

## 2023-02-18 NOTE — Progress Notes (Signed)
Pt is awake, alert and oriented X3. Pt did not voice any complaints of pain or discomfort. No signs of acute distress noted. Administered scheduled meds with no issue. Pt denies current SI/HI/AVH, plan or intent. Staff will monitor for pt's safety.

## 2023-02-18 NOTE — Group Note (Signed)
Group Topic: Positive Affirmations  Group Date: 02/18/2023 Start Time: 2000 End Time: 2045 Facilitators: Guss Bunde  Department: [facility]  Number of Participants: 6  Group Focus: affirmation Treatment Modality:  Leisure Development Interventions utilized were group exercise Purpose: relapse prevention strategies  Name: [name] Date of Birth: 1960/11/13  MR: [id number]    Level of Participation: active Quality of Participation: attentive Interactions with others: gave feedback Mood/Affect: appropriate Triggers (if applicable): s Cognition: goal directed Progress: Other Response: s Plan: patient will be encouraged to complete goals  Patients Problems:  Patient Active Problem List   Diagnosis Date Noted   Anxiety disorder, unspecified 02/16/2023   Alcohol use disorder 02/16/2023   Sleep disorder, unspecified 02/16/2023   Alcohol abuse 02/15/2023   Alcohol dependence ([name]) 05/16/2022   Low energy 03/06/2022   Moderate alcohol dependence in early remission ([name]) 02/24/2022   Suicidal ideation 07/07/2021   Generalized anxiety disorder 07/07/2021   Other allergic rhinitis 09/09/2020   Adverse reaction to food, subsequent encounter 09/09/2020   History of bee sting allergy 09/09/2020   Alcohol dependence with alcohol-induced mood disorder ([name]) 03/19/2020   MDD (major depressive disorder), recurrent episode, severe ([name]) 03/18/2020   History of substance abuse ([name]) 01/04/2020   Severe 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 features ([name]) 12/10/2019   Tinea cruris 07/06/2019   Hemoglobin A1c less than 7.0% 07/06/2019   Muscle strain of wrist 04/05/2019   Pain in both wrists 04/05/2019   Anxiety 04/05/2019   Prediabetes 04/05/2019   MDD (major depressive disorder) 01/12/2019   Chest pain 10/25/2018   MDD (major depressive disorder), recurrent severe, without psychosis ([name]) 08/15/2018   Influenza A 07/17/2017   Lactic acidosis     Cough    Nausea and vomiting    Severe episode of recurrent major depressive disorder, without psychotic features ([name])    MDD (major depressive disorder), single episode, severe with psychotic features ([name]) 06/27/2017   MDD (major depressive disorder), severe ([name]) 06/23/2017   Essential hypertension 01/13/2016   Unstable angina ([name])    ED (erectile dysfunction) 02/15/2014   Atypical chest pain 12/14/2011   Allergy to contrast media (used for diagnostic x-rays) 05/12/2011   Dyslipidemia, goal LDL below 70 05/09/2011   DM2 (diabetes mellitus, type 2) ([name]) 07/10/2010   CAD S/P percutaneous coronary angioplasty 07/10/2010

## 2023-02-18 NOTE — ED Notes (Signed)
 Pt was provided lunch

## 2023-02-18 NOTE — Group Note (Signed)
Group Topic: Social Support  Group Date: 02/18/2023 Start Time: 1000 End Time: 1030 Facilitators: Londell Moh, NT  Department: [facility]  Number of Participants: 5  Group Focus: affirmation Treatment Modality:  Psychoeducation Interventions utilized were patient education Purpose: increase insight  Name: [name] Date of Birth: May 25, 1961  MR: [id number]    Level of Participation: Pt did not attend group.  Patients Problems:  Patient Active Problem List   Diagnosis Date Noted   Anxiety disorder, unspecified 02/16/2023   Alcohol use disorder 02/16/2023   Sleep disorder, unspecified 02/16/2023   Alcohol abuse 02/15/2023   Alcohol dependence ([name]) 05/16/2022   Low energy 03/06/2022   Moderate alcohol dependence in early remission ([name]) 02/24/2022   Suicidal ideation 07/07/2021   Generalized anxiety disorder 07/07/2021   Other allergic rhinitis 09/09/2020   Adverse reaction to food, subsequent encounter 09/09/2020   History of bee sting allergy 09/09/2020   Alcohol dependence with alcohol-induced mood disorder ([name]) 03/19/2020   MDD (major depressive disorder), recurrent episode, severe ([name]) 03/18/2020   History of substance abuse ([name]) 01/04/2020   Severe 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 features ([name]) 12/10/2019   Tinea cruris 07/06/2019   Hemoglobin A1c less than 7.0% 07/06/2019   Muscle strain of wrist 04/05/2019   Pain in both wrists 04/05/2019   Anxiety 04/05/2019   Prediabetes 04/05/2019   MDD (major depressive disorder) 01/12/2019   Chest pain 10/25/2018   MDD (major depressive disorder), recurrent severe, without psychosis ([name]) 08/15/2018   Influenza A 07/17/2017   Lactic acidosis    Cough    Nausea and vomiting    Severe episode of recurrent major depressive disorder, without psychotic features ([name])    MDD (major depressive disorder), single episode, severe with psychotic features ([name]) 06/27/2017   MDD  (major depressive disorder), severe ([name]) 06/23/2017   Essential hypertension 01/13/2016   Unstable angina ([name])    ED (erectile dysfunction) 02/15/2014   Atypical chest pain 12/14/2011   Allergy to contrast media (used for diagnostic x-rays) 05/12/2011   Dyslipidemia, goal LDL below 70 05/09/2011   DM2 (diabetes mellitus, type 2) ([name]) 07/10/2010   CAD S/P percutaneous coronary angioplasty 07/10/2010

## 2023-02-18 NOTE — Group Note (Signed)
Group Topic: Wellness  Group Date: 02/18/2023 Start Time: 1130 End Time: 1200 Facilitators: Dickie La, RN  Department: [facility]  Number of Participants: 5  Group Focus: relapse prevention and self-awareness Treatment Modality:  Psychoeducation Interventions utilized were patient education, problem solving, and reality testing Purpose: express irrational fears, increase insight, and reinforce self-care  Name: [name] Date of Birth: 1961/04/21  MR: [id number]    Level of Participation: active Quality of Participation: attentive, cooperative, engaged, and offered feedback Interactions with others: gave feedback Mood/Affect: appropriate Triggers (if applicable): none Cognition: coherent/clear, goal directed, insightful, and logical Progress: Gaining insight Response: Pt participated in group and gave a positive feedback. Plan: follow-up needed  Patients Problems:  Patient Active Problem List   Diagnosis Date Noted   Anxiety disorder, unspecified 02/16/2023   Alcohol use disorder 02/16/2023   Sleep disorder, unspecified 02/16/2023   Alcohol abuse 02/15/2023   Alcohol dependence ([name]) 05/16/2022   Low energy 03/06/2022   Moderate alcohol dependence in early remission ([name]) 02/24/2022   Suicidal ideation 07/07/2021   Generalized anxiety disorder 07/07/2021   Other allergic rhinitis 09/09/2020   Adverse reaction to food, subsequent encounter 09/09/2020   History of bee sting allergy 09/09/2020   Alcohol dependence with alcohol-induced mood disorder ([name]) 03/19/2020   MDD (major depressive disorder), recurrent episode, severe ([name]) 03/18/2020   History of substance abuse ([name]) 01/04/2020   Severe 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 features ([name]) 12/10/2019   Tinea cruris 07/06/2019   Hemoglobin A1c less than 7.0% 07/06/2019   Muscle strain of wrist 04/05/2019   Pain in both wrists 04/05/2019   Anxiety 04/05/2019   Prediabetes  04/05/2019   MDD (major depressive disorder) 01/12/2019   Chest pain 10/25/2018   MDD (major depressive disorder), recurrent severe, without psychosis ([name]) 08/15/2018   Influenza A 07/17/2017   Lactic acidosis    Cough    Nausea and vomiting    Severe episode of recurrent major depressive disorder, without psychotic features ([name])    MDD (major depressive disorder), single episode, severe with psychotic features ([name]) 06/27/2017   MDD (major depressive disorder), severe ([name]) 06/23/2017   Essential hypertension 01/13/2016   Unstable angina ([name])    ED (erectile dysfunction) 02/15/2014   Atypical chest pain 12/14/2011   Allergy to contrast media (used for diagnostic x-rays) 05/12/2011   Dyslipidemia, goal LDL below 70 05/09/2011   DM2 (diabetes mellitus, type 2) ([name]) 07/10/2010   CAD S/P percutaneous coronary angioplasty 07/10/2010

## 2023-02-18 NOTE — ED Notes (Signed)
Patient is sleeping. Respirations equal and unlabored, skin warm and dry. No change in assessment or acuity. Routine safety checks conducted according to facility protocol. Will continue to monitor for safety.   

## 2023-02-18 NOTE — ED Notes (Signed)
Patient observed/assessed at bedside lying in bed asleep. Patient alert and oriented to self and location. He denies A/V/H. He denies having any thoughts/plan of self harm and harm towards others. Patient states that appetite has been good throughout the day. Verbalizes no further complaints at this time. Will continue to monitor and support.

## 2023-02-18 NOTE — Progress Notes (Signed)
Pt was visible in the milieu this shift. No distress noted or concerns voiced. Staff will monitor for pt's safety.

## 2023-02-18 NOTE — ED Notes (Signed)
Pt was provided dinner.

## 2023-02-18 NOTE — ED Notes (Signed)
Pt was provided breakfast.

## 2023-02-18 NOTE — ED Provider Notes (Addendum)
Behavioral Health Progress Note  Date and Time: 02/18/2023 8:58 AM Name: [name] MRN:  [id number]  Subjective: [name] is a 62 year old male with a psychiatric history of alcohol abuse, GAD, and MDD who was admitted to the [facility] for alcohol detox.  Patient says he is doing well.   His plan remains to go to IOP at [facility] the [place] with a backup plan of coming to IOP here if he does not get accepted there.  At this time he denies having any cravings or withdrawal symptoms from alcohol. He denies any somatic complaints.  Diagnosis:  Final diagnoses:  Alcohol use disorder   Total Time spent with patient: 20 minutes   Past Psychiatric History: alcohol abuse, MDD, GAD Past Medical History: Type 2 diabetes mellitus, CAD s/p percutaneous coronary angioplasty, HTN,  Family History:  Father (deceased): prostate cancer Brother (deceased): liver cancer  Mother (deceased): brain cancer  Family Psychiatric  History: denies any family psychiatric history  Social History: Unemployed (on disability since myocardial infarction). Widowed (wife passed away from breast cancer about 3-4 years ago. 2 children (not nearby). No family in the area. Support system includes AA and church community (previous, not currently involved but wants to rejoin).  Substance Use:  Alcohol: 1 bottle of wine, 4 beers, +/- 1 pint of gin every 3-4 days (1 day of drinking followed by 2-3 days of recovery)  Tobacco: smokes about 10 cigarettes on days that he drinks alcohol  Marijuana: denies use Cocaine: uses about once every 3 months (most recent use 1 mo ago)  Amphetamine: denies use  Opioids: denies use     Sleep: Good  Sleep: Normal ("exceptionally well") Appetite: Normal  Current Medications: Current Facility-Administered Medications  Medication Dose Route Frequency Provider Last Rate Last Admin   acetaminophen (TYLENOL) tablet 650 mg  650 mg Oral Q6H PRN Augusto Gamble, MD   650 mg at  02/17/23 0932   alum & mag hydroxide-simeth (MAALOX/MYLANTA) 200-200-20 MG/5ML suspension 30 mL  30 mL Oral Q4H PRN Augusto Gamble, MD       amLODipine (NORVASC) tablet 10 mg  10 mg Oral Daily Sindy Guadeloupe, NP   10 mg at 02/18/23 4098   aspirin EC tablet 81 mg  81 mg Oral Daily Sindy Guadeloupe, NP   81 mg at 02/18/23 1191   bismuth subsalicylate (PEPTO BISMOL) chewable tablet 524 mg  524 mg Oral Q3H PRN Augusto Gamble, MD       cholecalciferol (VITAMIN D3) 25 MCG (1000 UNIT) tablet 5,000 Units  5,000 Units Oral Lestine Mount, NP   5,000 Units at 02/18/23 4782   clopidogrel (PLAVIX) tablet 75 mg  75 mg Oral Daily Sindy Guadeloupe, NP   75 mg at 02/18/23 9562   haloperidol (HALDOL) tablet 5 mg  5 mg Oral Q6H PRN Augusto Gamble, MD       And   LORazepam (ATIVAN) tablet 1 mg  1 mg Oral Q6H PRN Augusto Gamble, MD       And   diphenhydrAMINE (BENADRYL) capsule 25 mg  25 mg Oral Q6H PRN Augusto Gamble, MD       haloperidol lactate (HALDOL) injection 5 mg  5 mg Intramuscular Q6H PRN Augusto Gamble, MD       And   LORazepam (ATIVAN) injection 1 mg  1 mg Intravenous Q6H PRN Augusto Gamble, MD       And   diphenhydrAMINE (BENADRYL) injection 25 mg  25 mg Intramuscular Q6H PRN Daylynn Stumpp,  Reuel Boom, MD       EPINEPHrine (EPI-PEN) injection 0.3 mg  0.3 mg Intramuscular PRN Sindy Guadeloupe, NP       ezetimibe (ZETIA) tablet 10 mg  10 mg Oral Daily Sindy Guadeloupe, NP   10 mg at 02/18/23 6387   hydrOXYzine (ATARAX) tablet 25 mg  25 mg Oral TID PRN Augusto Gamble, MD       Melene Muller ON 02/19/2023] LORazepam (ATIVAN) tablet 1 mg  1 mg Oral Daily Sindy Guadeloupe, NP       LORazepam (ATIVAN) tablet 1 mg  1 mg Oral Q6H PRN Nelly Rout, MD       metFORMIN (GLUCOPHAGE-XR) 24 hr tablet 500 mg  500 mg Oral Q breakfast Sindy Guadeloupe, NP   500 mg at 02/18/23 0836   metoprolol tartrate (LOPRESSOR) tablet 100 mg  100 mg Oral BID Sindy Guadeloupe, NP   100 mg at 02/18/23 0836   mirtazapine (REMERON) tablet 15 mg  15 mg Oral Dorthey Sawyer, NP   15 mg at  02/17/23 2142   multivitamin with minerals tablet 1 tablet  1 tablet Oral Daily Sindy Guadeloupe, NP   1 tablet at 02/17/23 5643   multivitamin with minerals tablet 1 tablet  1 tablet Oral Daily Sindy Guadeloupe, NP   1 tablet at 02/18/23 0836   nitroGLYCERIN (NITROSTAT) SL tablet 0.4 mg  0.4 mg Sublingual Q5 Min x 3 PRN Sindy Guadeloupe, NP       ondansetron [facility]) tablet 8 mg  8 mg Oral Q8H PRN Augusto Gamble, MD       polyethylene glycol (MIRALAX / GLYCOLAX) packet 17 g  17 g Oral Daily PRN Augusto Gamble, MD       polyvinyl alcohol (LIQUIFILM TEARS) 1.4 % ophthalmic solution 1 drop  1 drop Both Eyes Daily PRN Sindy Guadeloupe, NP       senna (SENOKOT) tablet 8.6 mg  1 tablet Oral QHS PRN Augusto Gamble, MD       sertraline (ZOLOFT) tablet 50 mg  50 mg Oral Daily Augusto Gamble, MD   50 mg at 02/18/23 3295   thiamine (VITAMIN B1) tablet 100 mg  100 mg Oral Daily Sindy Guadeloupe, NP   100 mg at 02/18/23 1884   traZODone (DESYREL) tablet 50 mg  50 mg Oral QHS PRN Sindy Guadeloupe, NP       Current Outpatient Medications  Medication Sig Dispense Refill   amLODipine (NORVASC) 10 MG tablet Take 1 tablet (10 mg total) by mouth daily. 90 tablet 0   aspirin 81 MG EC tablet Take 1 tablet (81 mg total) by mouth daily. (May buy from over the counter): Swallow whole for heart health (Patient taking differently: Take 81 mg by mouth at bedtime. (May buy from over the counter): Swallow whole for heart health) 30 tablet 0   Cholecalciferol 25 MCG (1000 UT) tablet Take 1,000 Units by mouth daily.     clopidogrel (PLAVIX) 75 MG tablet Take 1 tablet (75 mg total) by mouth daily. 30 tablet 2   EPINEPHrine 0.3 mg/0.3 mL IJ SOAJ injection Inject 0.3 mg into the muscle as needed for anaphylaxis. 1 each 2   ezetimibe (ZETIA) 10 MG tablet Take 1 tablet (10 mg total) by mouth daily. 90 tablet 0   glucose blood (ACCU-CHEK GUIDE) test strip USE 1 STRIP TO CHECK GLUCOSE THREE TIMES DAILY AS DIRECTED 300 each 0   ibuprofen (ADVIL) 800 MG tablet  Take 1 tablet (800 mg total) by mouth every 8 (  eight) hours as needed. (Patient taking differently: Take 800 mg by mouth every 8 (eight) hours as needed for mild pain.) 30 tablet 0   LIVALO 4 MG TABS Take 1 tablet by mouth once daily 90 tablet 3   metFORMIN (GLUCOPHAGE-XR) 500 MG 24 hr tablet Take 1 tablet (500 mg total) by mouth daily with breakfast. 30 tablet 2   metoprolol tartrate (LOPRESSOR) 100 MG tablet Take 1 tablet (100 mg total) by mouth 2 (two) times daily. NEED OV. 180 tablet 0   mirtazapine (REMERON) 15 MG tablet Take 1 tablet (15 mg total) by mouth at bedtime. 30 tablet 0   Multiple Vitamin (MULTIVITAMIN WITH MINERALS) TABS tablet Take 1 tablet by mouth daily.     nitroGLYCERIN (NITROSTAT) 0.4 MG SL tablet Place 1 tablet (0.4 mg total) under the tongue every 5 (five) minutes x 3 doses as needed for chest pain. 25 tablet 3   Polyethyl Glycol-Propyl Glycol (SYSTANE) 0.4-0.3 % GEL ophthalmic gel Place 1 Application into both eyes daily as needed (For dry eyes).     Probiotic Product (RESTORA) CAPS Take 1 capsule by mouth daily. 30 capsule 2   sildenafil (VIAGRA) 100 MG tablet Take 100 mg by mouth daily as needed for erectile dysfunction.     Testosterone 1.62 % GEL PLACE 1 PUMP ONTO THE SKIN DAILY. (Patient taking differently: Apply 1 Pump topically daily.) 75 g 0   traZODone (DESYREL) 50 MG tablet Take 1 tablet (50 mg total) by mouth at bedtime as needed for sleep. (Patient taking differently: Take 50 mg by mouth at bedtime.) 30 tablet 0   hydrOXYzine (ATARAX) 25 MG tablet Take 25 mg by mouth 3 (three) times daily as needed for anxiety. (Patient not taking: Reported on 02/16/2023)      Labs  Lab Results:     Latest Ref Rng & Units 02/15/2023    9:13 PM 11/10/2022    9:28 AM 11/07/2022    2:40 AM  CBC  WBC 4.0 - 10.5 K/uL 6.4  6.1  4.9   Hemoglobin 13.0 - 17.0 g/dL 46.9  62.9  52.8   Hematocrit 39.0 - 52.0 % 42.5  49.2  45.4   Platelets 150 - 400 K/uL 208  209  196       Latest  Ref Rng & Units 02/15/2023    9:13 PM 11/10/2022    9:28 AM 11/07/2022    2:39 AM  CMP  Glucose 70 - 99 mg/dL 413  244  010   BUN 8 - 23 mg/dL 14  13  11    Creatinine 0.61 - 1.24 mg/dL 2.72  5.36  6.44   Sodium 135 - 145 mmol/L 139  138  140   Potassium 3.5 - 5.1 mmol/L 4.2  4.1  4.0   Chloride 98 - 111 mmol/L 105  100  108   CO2 22 - 32 mmol/L 21  29  24    Calcium 8.9 - 10.3 mg/dL 9.7  9.2  8.6   Total Protein 6.5 - 8.1 g/dL 6.5  6.8  6.3   Total Bilirubin 0.3 - 1.2 mg/dL 0.4  0.4  0.4   Alkaline Phos 38 - 126 U/L 61  69  65   AST 15 - 41 U/L 32  23  21   ALT 0 - 44 U/L 83  38  38     Blood Alcohol level:  Lab Results  Component Value Date   ETH <10 02/15/2023   ETH <10 08/07/2022  Metabolic Disorder Labs: Lab Results  Component Value Date   HGBA1C 7.2 (H) 02/15/2023   MPG 159.94 02/15/2023   MPG 142.72 08/07/2022   Lab Results  Component Value Date   PROLACTIN 13.0 03/17/2020   Lab Results  Component Value Date   CHOL 154 02/15/2023   TRIG 219 (H) 02/15/2023   HDL 43 02/15/2023   CHOLHDL 3.6 02/15/2023   VLDL 44 (H) 02/15/2023   LDLCALC 67 02/15/2023   LDLCALC 56 11/18/2022   Therapeutic Lab Levels: No results found for: "LITHIUM" No results found for: "VALPROATE" No results found for: "CBMZ" Physical Findings   AIMS    Flowsheet Row Ad[facility]Discharged) [facility] (Discharged) from [facility] Ad[facility] (Discharged) from [facility] Ad[facility] (Discharged) from [facility] (Discharged) [facility]  AIMS Total Score 0 0 0 0 0      AUDIT    Flowsheet Row Office Visit from 11/16/2022 in [facility] Office Visit from  08/27/2020 in [facility] Admission (Discharged) from [facility] (Discharged[facility] (Discharged) from [facility]  Alcohol Use Disorder Identification Test Final Score (AUDIT) 24 27 28 13 25       GAD-7    Flowsheet Row Office Visit from 02/15/2023 in [facility] Clinical Support from 02/24/2022 in [facility] Office Visit from 01/08/2022 in [facility] Video Visit from 11/19/2021 in [facility] Clinical Support from 07/07/2021 in [facility]  Total GAD-7 Score 10 2 12 13 19       PHQ2-9    Flowsheet Row ED from 02/15/2023 in [facility] Most recent reading at 02/15/2023  9:08 PM Office Visit from 02/15/2023 in [facility] Most recent reading at 02/15/2023  8:21 AM Office Visit from 11/16/2022 in [facility] Most recent reading at 11/16/2022  3:58 PM ED from 08/07/2022 in [facility] Most recent reading at 08/12/2022  2:08 PM ED from 05/16/2022 in [facility] Most recent reading at 05/19/2022  1:59 PM  PHQ-2 Total Score 5 6 6 2 3   PHQ-9 Total Score 17 17 11 10 12       Flowsheet Row ED from 02/15/2023 in [facility] Most recent reading at 02/16/2023 12:52 PM ED from 02/15/2023 in [facility] Most recent reading at 02/15/2023  7:32 PM ED from 11/10/2022 in [facility] Most recent reading at 11/10/2022  9:27 AM  C-SSRS RISK CATEGORY No Risk Low Risk No Risk       Musculoskeletal  Strength & Muscle Tone: within normal limits Gait &  Station: normal Patient leans: N/A  Psychiatric Specialty Exam  Presentation  General Appearance: Appropriate for Environment; Well Groomed   Eye Contact:Good   Speech:Clear and Coherent; Normal Rate   Speech Volume:Normal   Behavior: Appropriate to setting, calm and cooperative    Social relatedness: Adult nurse, engaged, pleasant  Handedness:Right   Mood and Affect  Mood:-- ("doing well")   Affect:Appropriate; Congruent; Full Range   Thought Process  Thought Processes:Coherent; Linear; Goal Directed   Descriptions of Associations:Intact   Orientation:Full (Time, Place and Person)   Thought Content:Logical; WDL  Diagnosis of Schizophrenia or Schizoaffective disorder in past: No    Hallucinations:Hallucinations: None    Ideas of Reference:None   Suicidal Thoughts:Suicidal Thoughts: No    Homicidal Thoughts:Homicidal Thoughts: No    Sensorium  Memory:Immediate Good; Recent Good; Remote Good   Judgment:Good   Insight:Good   Executive Functions  Concentration:Good   Attention Span:Good   Recall:Good   Fund of Knowledge:Good   Language:Good   Psychomotor Activity  Psychomotor Activity:Psychomotor Activity: Normal    Assets  Assets:Resilience   Sleep  Sleep:Sleep: Good    No data recorded   Physical Exam  Physical Exam Vitals and nursing note reviewed.  HENT:     Head: Normocephalic and atraumatic.  Pulmonary:     Effort: Pulmonary effort is normal.  Musculoskeletal:     Cervical back: Normal range of motion.  Neurological:     General: No focal deficit present.     Mental Status: He is alert. Mental status is at baseline.    Review of Systems  Constitutional: Negative.   Respiratory: Negative.    Cardiovascular: Negative.   Gastrointestinal: Negative.   Genitourinary: Negative.   Psychiatric/Behavioral:         Psychiatric subjective data addressed in PSE or HPI / daily subjective report    Blood pressure (!) 121/95, pulse 60, temperature 98.3 F (36.8 C), temperature source Oral, resp. rate 16, SpO2 97%. There is no height or weight on file to calculate BMI.  Treatment Plan Summary: Daily contact with patient to assess and evaluate symptoms and progress in treatment and Medication management:  -- continue sertraline 50 mg daily for depressive symptoms  -- continue mirtazapine 15 mg at bedtime for depressive symptoms -- continue CIWA with scheduled and as-needed lorazepam protocol -- medical regimen: aspirin 81 mg and clopidogrel 75 mg daily for primary MI prophylaxis, amlodipine 10 mg daily for HTN, metformin 500 mg daily for diabetes -- Patient does not need nicotine replacement  PRNs:               -- continue acetaminophen 650 mg every 6 hours as needed for mild to moderate pain, fever, and headaches              -- continue hydroxyzine 25 mg three times a day as needed for anxiety              -- continue bismuth subsalicylate 524 mg oral chewable tablet every 3 hours as needed for diarrhea / loose stools              -- continue senna 8.6 mg oral at bedtime and polyethylene glycol 17 g oral daily as needed for mild to moderate constipation              -- continue ondansetron 8 mg every 8 hours as needed for nausea or vomiting              -- continue aluminum-magnesium hydroxide + simethicone 30 mL every 4 hours as needed for heartburn or indigestion              -- continue trazodone 50 mg at bedtime as needed for insomnia  Provided motivational interviewing. Patient encouraged to rejoin AA and church group as these  seemed to be effective support systems for the patient in the past.   Disposition: At this time, patient plans to go to [facility] the [place] upon discharge. He was informed of the walk-in clinic Mon-Fri 9 am-1 pm. Discharge pending completion of detox, patient agreeable.   Keene Breath, Medical Student 02/17/23 11:12 AM   Augusto Gamble,  MD 02/18/23 8:58 AM

## 2023-02-19 DIAGNOSIS — F1010 Alcohol abuse, uncomplicated: Secondary | ICD-10-CM | POA: Diagnosis not present

## 2023-02-19 DIAGNOSIS — F411 Generalized anxiety disorder: Secondary | ICD-10-CM | POA: Diagnosis not present

## 2023-02-19 DIAGNOSIS — Z79899 Other long term (current) drug therapy: Secondary | ICD-10-CM | POA: Diagnosis not present

## 2023-02-19 MED ORDER — MIRTAZAPINE 15 MG PO TABS: 15.0000 mg | ORAL_TABLET | Freq: Every day | ORAL | 0 refills | Status: DC

## 2023-02-19 MED ORDER — VITAMIN B-12 100 MCG PO TABS: 100.0000 ug | ORAL_TABLET | Freq: Every day | ORAL | 0 refills | Status: AC

## 2023-02-19 MED ORDER — ASPIRIN 81 MG PO TBEC: 81.0000 mg | DELAYED_RELEASE_TABLET | Freq: Every day | ORAL | 0 refills | Status: AC

## 2023-02-19 MED ORDER — SERTRALINE HCL 50 MG PO TABS: 50.0000 mg | ORAL_TABLET | Freq: Every day | ORAL | 0 refills | Status: DC

## 2023-02-19 NOTE — ED Notes (Signed)
Patient A&O x 4, ambulatory. Patient discharged in no acute distress. Patient denied SI/HI, A/VH upon discharge. Patient verbalized understanding of all discharge instructions explained by staff, to include follow up appointments, RX's and safety plan. Patient reported mood 10/10.  Pt belongings returned to patient from locker # 16 intact. Patient escorted to lobby via staff for transport to destination. Safety maintained.

## 2023-02-19 NOTE — Group Note (Signed)
Group Topic: Wellness  Group Date: 02/19/2023 Start Time: 1000 End Time: 1030 Facilitators: Priscille Kluver, NT  Department: [facility]  Number of Participants: 4  Group Focus: daily focus Treatment Modality:  Skills Training Interventions utilized were support Purpose: enhance coping skills  Name: [name] Date of Birth: Sep 30, 1960  MR: [id number]    Level of Participation: Pt did not attend group Patients Problems:  Patient Active Problem List   Diagnosis Date Noted   Anxiety disorder, unspecified 02/16/2023   Alcohol use disorder 02/16/2023   Sleep disorder, unspecified 02/16/2023   Alcohol abuse 02/15/2023   Alcohol dependence ([name]) 05/16/2022   Low energy 03/06/2022   Moderate alcohol dependence in early remission ([name]) 02/24/2022   Suicidal ideation 07/07/2021   Generalized anxiety disorder 07/07/2021   Other allergic rhinitis 09/09/2020   Adverse reaction to food, subsequent encounter 09/09/2020   History of bee sting allergy 09/09/2020   Alcohol dependence with alcohol-induced mood disorder ([name]) 03/19/2020   MDD (major depressive disorder), recurrent episode, severe ([name]) 03/18/2020   History of substance abuse ([name]) 01/04/2020   Severe 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 features ([name]) 12/10/2019   Tinea cruris 07/06/2019   Hemoglobin A1c less than 7.0% 07/06/2019   Muscle strain of wrist 04/05/2019   Pain in both wrists 04/05/2019   Anxiety 04/05/2019   Prediabetes 04/05/2019   MDD (major depressive disorder) 01/12/2019   Chest pain 10/25/2018   MDD (major depressive disorder), recurrent severe, without psychosis ([name]) 08/15/2018   Influenza A 07/17/2017   Lactic acidosis    Cough    Nausea and vomiting    Severe episode of recurrent major depressive disorder, without psychotic features ([name])    MDD (major depressive disorder), single episode, severe with psychotic features ([name]) 06/27/2017   MDD (major  depressive disorder), severe ([name]) 06/23/2017   Essential hypertension 01/13/2016   Unstable angina ([name])    ED (erectile dysfunction) 02/15/2014   Atypical chest pain 12/14/2011   Allergy to contrast media (used for diagnostic x-rays) 05/12/2011   Dyslipidemia, goal LDL below 70 05/09/2011   DM2 (diabetes mellitus, type 2) ([name]) 07/10/2010   CAD S/P percutaneous coronary angioplasty 07/10/2010

## 2023-02-19 NOTE — ED Provider Notes (Addendum)
FBC/OBS ASAP Discharge Summary  Date: 02/19/23 Name: [name] MRN: [id number]  Discharge Diagnoses:  Final diagnoses:  Alcohol use disorder   [name], 62 y/o male with a history of alcohol abuse and general anxiety disorder admitted for detox from alcohol.   Subjective: Patient says he has not felt this good in a long time.  He is ready to leave and go to IOP. Patient denies having suicidal or homicidal thoughts. He is not hearing any voices others don't hear or seeing things others don't see. He denies experiencing any withdrawal symptoms.  Stay Summary: During the patient's hospitalization, patient had extensive initial psychiatric evaluation, and follow-up psychiatric evaluations every day.  The following meds were provided and managed during his stay. Scheduled Meds:  amLODipine  10 mg Oral Daily   aspirin EC  81 mg Oral Daily   cholecalciferol  5,000 Units Oral QODAY   clopidogrel  75 mg Oral Daily   ezetimibe  10 mg Oral Daily   LORazepam  1 mg Oral Daily   metFORMIN  500 mg Oral Q breakfast   metoprolol tartrate  100 mg Oral BID   mirtazapine  15 mg Oral QHS   multivitamin with minerals  1 tablet Oral Daily   sertraline  50 mg Oral Daily   thiamine  100 mg Oral Daily   PRN Meds:.acetaminophen, alum & mag hydroxide-simeth, bismuth subsalicylate, haloperidol **AND** LORazepam **AND** diphenhydrAMINE, haloperidol lactate **AND** LORazepam **AND** diphenhydrAMINE, EPINEPHrine, hydrOXYzine, nitroGLYCERIN, ondansetron, polyethylene glycol, polyvinyl alcohol, senna, traZODone  Patient's care was discussed during the interdisciplinary team meeting every day during the hospitalization.  The patient denies any side effects to prescribed psychiatric medication.  Gradually, patient started adjusting to milieu. The patient was evaluated each day by a clinical provider to ascertain response to treatment. Improvement was noted by the patient's report of decreasing symptoms,  improved sleep and appetite, affect, medication tolerance, behavior, and participation in unit programming.  Patient was asked each day to complete a self inventory noting mood, mental status, pain, new symptoms, anxiety and concerns.   Symptoms were reported as significantly decreased or resolved completely by discharge.  The patient reports that their mood is stable.  The patient denied having suicidal thoughts for more than 48 hours prior to discharge.  Patient denies having homicidal thoughts.  Patient denies having auditory hallucinations.  Patient denies any visual hallucinations or other symptoms of psychosis.  The patient was motivated to continue taking medication with a goal of continued improvement in mental health.   Symptoms were reported as significantly decreased or resolved completely by discharge.   On day of discharge, the patient reports that their mood is stable. The patient denied having suicidal thoughts for more than 48 hours prior to discharge.  Patient denies having homicidal thoughts.  Patient denies having auditory hallucinations.  Patient denies any visual hallucinations or other symptoms of psychosis. The patient was motivated to continue taking medication with a goal of continued improvement in mental health.   The patient reports their target psychiatric symptoms of alcohol withdrawal responded well to the psychiatric medications, and the patient reports overall benefit other psychiatric hospitalization. Supportive psychotherapy was provided to the patient. The patient also participated in regular group therapy while hospitalized. Coping skills, problem solving as well as relaxation therapies were also part of the unit programming.  Labs were reviewed with the patient, and abnormal results were discussed with the patient.  The patient is able to verbalize their individual safety plan to  this provider.  # It is recommended to the patient to continue psychiatric  medications as prescribed, after discharge from the hospital.    # It is recommended to the patient to follow up with your outpatient psychiatric provider and PCP.  # It was discussed with the patient, the impact of alcohol, drugs, tobacco have been there overall psychiatric and medical wellbeing, and total abstinence from substance use was recommended the patient.ed.  # Prescriptions provided or sent directly to preferred pharmacy at discharge. Patient agreeable to plan. Given opportunity to ask questions. Appears to feel comfortable with discharge.    # In the event of worsening symptoms, the patient is instructed to call the crisis hotline, 911 and or go to the nearest ED for appropriate evaluation and treatment of symptoms. To follow-up with primary care provider for other medical issues, concerns and or health care needs  # Patient was discharged to the community with plans to follow up provided in the discharge instructions.  On day of discharge patient is not suicidal or homicidal. He is not exhibiting bizarre behavior nor endorsing auditory or visual hallucinations. He is not exhibiting any life-threatening substance withdrawal symptoms. At present, there are no indications to hold patient involuntarily.  Total Time spent with patient: 45 minutes  Past Psychiatric History: alcohol abuse, MDD, GAD Past Medical History: Type 2 diabetes mellitus, CAD s/p percutaneous coronary angioplasty, HTN,  Family History:  Father (deceased): prostate cancer Brother (deceased): liver cancer  Mother (deceased): brain cancer  Family Psychiatric  History: denies any family psychiatric history  Social History: Unemployed (on disability since myocardial infarction). Widowed (wife passed away from breast cancer about 3-4 years ago. 2 children (not nearby). No family in the area. Support system includes AA and church community (previous, not currently involved but wants to rejoin).  Substance Use:   Alcohol: 1 bottle of wine, 4 beers, +/- 1 pint of gin every 3-4 days (1 day of drinking followed by 2-3 days of recovery)  Tobacco: smokes about 10 cigarettes on days that he drinks alcohol  Marijuana: denies use Cocaine: uses about once every 3 months (most recent use 1 mo ago)  Amphetamine: denies use  Opioids: denies use Tobacco Cessation:  N/A, patient does not currently use tobacco products  Current Medications:  Current Facility-Administered Medications  Medication Dose Route Frequency Provider Last Rate Last Admin   acetaminophen (TYLENOL) tablet 650 mg  650 mg Oral Q6H PRN Augusto Gamble, MD   650 mg at 02/17/23 0932   alum & mag hydroxide-simeth (MAALOX/MYLANTA) 200-200-20 MG/5ML suspension 30 mL  30 mL Oral Q4H PRN Augusto Gamble, MD       amLODipine (NORVASC) tablet 10 mg  10 mg Oral Daily Sindy Guadeloupe, NP   10 mg at 02/18/23 9604   aspirin EC tablet 81 mg  81 mg Oral Daily Sindy Guadeloupe, NP   81 mg at 02/18/23 5409   bismuth subsalicylate (PEPTO BISMOL) chewable tablet 524 mg  524 mg Oral Q3H PRN Augusto Gamble, MD       cholecalciferol (VITAMIN D3) 25 MCG (1000 UNIT) tablet 5,000 Units  5,000 Units Oral Lestine Mount, NP   5,000 Units at 02/18/23 8119   clopidogrel (PLAVIX) tablet 75 mg  75 mg Oral Daily Sindy Guadeloupe, NP   75 mg at 02/18/23 0836   haloperidol (HALDOL) tablet 5 mg  5 mg Oral Q6H PRN Augusto Gamble, MD       And   LORazepam (ATIVAN) tablet 1 mg  1 mg Oral Q6H PRN Augusto Gamble, MD       And   diphenhydrAMINE (BENADRYL) capsule 25 mg  25 mg Oral Q6H PRN Augusto Gamble, MD       haloperidol lactate (HALDOL) injection 5 mg  5 mg Intramuscular Q6H PRN Augusto Gamble, MD       And   LORazepam (ATIVAN) injection 1 mg  1 mg Intravenous Q6H PRN Augusto Gamble, MD       And   diphenhydrAMINE (BENADRYL) injection 25 mg  25 mg Intramuscular Q6H PRN Augusto Gamble, MD       EPINEPHrine (EPI-PEN) injection 0.3 mg  0.3 mg Intramuscular PRN Sindy Guadeloupe, NP       ezetimibe (ZETIA) tablet  10 mg  10 mg Oral Daily Sindy Guadeloupe, NP   10 mg at 02/18/23 4259   hydrOXYzine (ATARAX) tablet 25 mg  25 mg Oral TID PRN Augusto Gamble, MD       LORazepam (ATIVAN) tablet 1 mg  1 mg Oral Daily Sindy Guadeloupe, NP       metFORMIN (GLUCOPHAGE-XR) 24 hr tablet 500 mg  500 mg Oral Q breakfast Sindy Guadeloupe, NP   500 mg at 02/18/23 0836   metoprolol tartrate (LOPRESSOR) tablet 100 mg  100 mg Oral BID Sindy Guadeloupe, NP   100 mg at 02/18/23 2113   mirtazapine (REMERON) tablet 15 mg  15 mg Oral Dorthey Sawyer, NP   15 mg at 02/18/23 2113   multivitamin with minerals tablet 1 tablet  1 tablet Oral Daily Sindy Guadeloupe, NP   1 tablet at 02/18/23 0836   nitroGLYCERIN (NITROSTAT) SL tablet 0.4 mg  0.4 mg Sublingual Q5 Min x 3 PRN Sindy Guadeloupe, NP       ondansetron [facility]) tablet 8 mg  8 mg Oral Q8H PRN Augusto Gamble, MD       polyethylene glycol (MIRALAX / GLYCOLAX) packet 17 g  17 g Oral Daily PRN Augusto Gamble, MD       polyvinyl alcohol (LIQUIFILM TEARS) 1.4 % ophthalmic solution 1 drop  1 drop Both Eyes Daily PRN Sindy Guadeloupe, NP       senna (SENOKOT) tablet 8.6 mg  1 tablet Oral QHS PRN Augusto Gamble, MD       sertraline (ZOLOFT) tablet 50 mg  50 mg Oral Daily Augusto Gamble, MD   50 mg at 02/18/23 5638   thiamine (VITAMIN B1) tablet 100 mg  100 mg Oral Daily Sindy Guadeloupe, NP   100 mg at 02/18/23 7564   traZODone (DESYREL) tablet 50 mg  50 mg Oral QHS PRN Sindy Guadeloupe, NP       Current Outpatient Medications  Medication Sig Dispense Refill   amLODipine (NORVASC) 10 MG tablet Take 1 tablet (10 mg total) by mouth daily. 90 tablet 0   aspirin 81 MG EC tablet Take 1 tablet (81 mg total) by mouth daily. (May buy from over the counter): Swallow whole for heart health (Patient taking differently: Take 81 mg by mouth at bedtime. (May buy from over the counter): Swallow whole for heart health) 30 tablet 0   Cholecalciferol 25 MCG (1000 UT) tablet Take 1,000 Units by mouth daily.     clopidogrel (PLAVIX) 75 MG  tablet Take 1 tablet (75 mg total) by mouth daily. 30 tablet 2   EPINEPHrine 0.3 mg/0.3 mL IJ SOAJ injection Inject 0.3 mg into the muscle as needed for anaphylaxis. 1 each 2   ezetimibe (ZETIA) 10 MG tablet Take  1 tablet (10 mg total) by mouth daily. 90 tablet 0   glucose blood (ACCU-CHEK GUIDE) test strip USE 1 STRIP TO CHECK GLUCOSE THREE TIMES DAILY AS DIRECTED 300 each 0   ibuprofen (ADVIL) 800 MG tablet Take 1 tablet (800 mg total) by mouth every 8 (eight) hours as needed. (Patient taking differently: Take 800 mg by mouth every 8 (eight) hours as needed for mild pain.) 30 tablet 0   LIVALO 4 MG TABS Take 1 tablet by mouth once daily 90 tablet 3   metFORMIN (GLUCOPHAGE-XR) 500 MG 24 hr tablet Take 1 tablet (500 mg total) by mouth daily with breakfast. 30 tablet 2   metoprolol tartrate (LOPRESSOR) 100 MG tablet Take 1 tablet (100 mg total) by mouth 2 (two) times daily. NEED OV. 180 tablet 0   mirtazapine (REMERON) 15 MG tablet Take 1 tablet (15 mg total) by mouth at bedtime. 30 tablet 0   Multiple Vitamin (MULTIVITAMIN WITH MINERALS) TABS tablet Take 1 tablet by mouth daily.     nitroGLYCERIN (NITROSTAT) 0.4 MG SL tablet Place 1 tablet (0.4 mg total) under the tongue every 5 (five) minutes x 3 doses as needed for chest pain. 25 tablet 3   Polyethyl Glycol-Propyl Glycol (SYSTANE) 0.4-0.3 % GEL ophthalmic gel Place 1 Application into both eyes daily as needed (For dry eyes).     Probiotic Product (RESTORA) CAPS Take 1 capsule by mouth daily. 30 capsule 2   sildenafil (VIAGRA) 100 MG tablet Take 100 mg by mouth daily as needed for erectile dysfunction.     Testosterone 1.62 % GEL PLACE 1 PUMP ONTO THE SKIN DAILY. (Patient taking differently: Apply 1 Pump topically daily.) 75 g 0   traZODone (DESYREL) 50 MG tablet Take 1 tablet (50 mg total) by mouth at bedtime as needed for sleep. (Patient taking differently: Take 50 mg by mouth at bedtime.) 30 tablet 0   hydrOXYzine (ATARAX) 25 MG tablet Take 25 mg  by mouth 3 (three) times daily as needed for anxiety. (Patient not taking: Reported on 02/16/2023)      PTA Medications: (Not in a hospital admission)      02/19/2023    9:13 AM 02/18/2023    1:35 PM 02/15/2023    9:08 PM  Depression screen PHQ 2/9  Decreased Interest 0 0 3  Down, Depressed, Hopeless 0 0 2  PHQ - 2 Score 0 0 5  Altered sleeping   3  Tired, decreased energy   3  Change in appetite   3  Feeling bad or failure about yourself    1  Trouble concentrating   2  Moving slowly or fidgety/restless   0  Suicidal thoughts   0  PHQ-9 Score   17  Difficult doing work/chores   Somewhat difficult    Flowsheet Row ED from 02/15/2023 in [facility] Most recent reading at 02/16/2023 12:52 PM ED from 02/15/2023 in [facility] Most recent reading at 02/15/2023  7:32 PM ED from 11/10/2022 in [facility] [place] Most recent reading at 11/10/2022  9:27 AM  C-SSRS RISK CATEGORY No Risk Low Risk No Risk       Musculoskeletal  Strength & Muscle Tone: within normal limits Gait & Station: normal Patient leans: N/A  Psychiatric Specialty Exam  Presentation General Appearance: Appropriate for Environment; Well Groomed   Eye Contact:Good   Speech:Clear and Coherent; Normal Rate   Speech Volume:Normal   Handedness:Right  Mood and Affect  Mood:-- ("I feel the best I've ever been in a long time")   Affect:Appropriate; Congruent; Full Range   Thought Process  Thought Processes:Coherent; Linear; Goal Directed   Descriptions of Associations:Intact   Orientation:Full (Time, Place and Person)   Thought Content:Logical; WDL   Diagnosis of Schizophrenia or Schizoaffective disorder in past: No    Hallucinations:Hallucinations: None   Ideas of Reference:None   Suicidal Thoughts:Suicidal Thoughts: No   Homicidal Thoughts:Homicidal Thoughts: No   Sensorium   Memory:Immediate Good; Recent Good; Remote Good   Judgment:Good   Insight:Good   Executive Functions  Concentration:Good   Attention Span:Good   Recall:Good   Fund of Knowledge:Good   Language:Good   Psychomotor Activity  Psychomotor Activity:Psychomotor Activity: Normal   Assets  Assets:Resilience   Sleep  Sleep:Sleep: Good   No data recorded  Physical Exam  Physical Exam Vitals and nursing note reviewed.  HENT:     Head: Normocephalic and atraumatic.  Pulmonary:     Effort: Pulmonary effort is normal.  Musculoskeletal:     Cervical back: Normal range of motion.  Neurological:     General: No focal deficit present.     Mental Status: He is alert. Mental status is at baseline.    Review of Systems  Constitutional: Negative.   Respiratory: Negative.    Cardiovascular: Negative.   Gastrointestinal: Negative.   Genitourinary: Negative.   Psychiatric/Behavioral:         Psychiatric subjective data addressed in PSE or HPI / daily subjective report   Blood pressure (!) 145/97, pulse (!) 57, temperature 97.9 F (36.6 C), temperature source Oral, resp. rate 18, SpO2 100%. There is no height or weight on file to calculate BMI.  Demographic Factors:  Male  Loss Factors: NA  Historical Factors: NA  Risk Reduction Factors:   Positive coping skills or problem solving skills  Continued Clinical Symptoms:  Alcohol/Substance Abuse/Dependencies More than one psychiatric diagnosis  Cognitive Features That Contribute To Risk:  None    Suicide Risk:  Mild:  Suicidal ideation of limited frequency, intensity, duration, and specificity.  There are no identifiable plans, no associated intent, mild dysphoria and related symptoms, good self-control (both objective and subjective assessment), few other risk factors, and identifiable protective factors, including available and accessible social support.  Plan Of Care/Follow-up recommendations:  Activity:  as tolerated  Diet: heart healthy  Other: -Follow-up with your outpatient psychiatric provider -instructions on appointment date, time, and address (location) are provided to you in discharge paperwork.  -Take your psychiatric medications as prescribed at discharge - instructions are provided to you in the discharge paperwork  -Follow-up with outpatient primary care doctor and other specialists -for management of chronic medical disease, including: health maintenance checks  -Testing: Follow-up with outpatient provider for abnormal lab results: none  -Recommend abstinence from alcohol, tobacco, and other illicit drug use at discharge.   -If your psychiatric symptoms recur, worsen, or if you have side effects to your psychiatric medications, call your outpatient psychiatric provider, 911, [phone] or go to the nearest emergency department.  -If suicidal thoughts recur, call your outpatient psychiatric provider, 911, [phone] or go to the nearest emergency department.  Disposition: home / self-care with outpatient resources provided for IOP  Augusto Gamble, MD 02/19/2023, 9:16 AM

## 2023-02-19 NOTE — ED Notes (Signed)
Patient A&Ox4. Denies intent to harm self/others when asked. Denies A/VH. Patient denies any physical complaints when asked. No acute distress noted. Support and encouragement provided. Routine safety checks conducted according to facility protocol. Encouraged patient to notify staff if thoughts of harm toward self or others arise. Patient verbalize understanding and agreement. Will continue to monitor for safety.    

## 2023-02-19 NOTE — ED Notes (Signed)
Patient is sleeping. Respirations equal and unlabored, skin warm and dry. No change in assessment or acuity. Routine safety checks conducted according to facility protocol. Will continue to monitor for safety.   

## 2023-02-21 ENCOUNTER — Encounter: Payer: Self-pay | Admitting: Certified Registered Nurse Anesthetist

## 2023-02-24 ENCOUNTER — Ambulatory Visit: Payer: MEDICAID | Admitting: Internal Medicine

## 2023-02-24 ENCOUNTER — Encounter: Payer: Self-pay | Admitting: Internal Medicine

## 2023-02-24 VITALS — BP 124/77 | HR 57 | Temp 97.1°F | Resp 18 | Ht 68.0 in | Wt 191.0 lb

## 2023-02-24 DIAGNOSIS — R197 Diarrhea, unspecified: Secondary | ICD-10-CM

## 2023-02-24 DIAGNOSIS — R112 Nausea with vomiting, unspecified: Secondary | ICD-10-CM

## 2023-02-24 DIAGNOSIS — D125 Benign neoplasm of sigmoid colon: Secondary | ICD-10-CM | POA: Diagnosis not present

## 2023-02-24 DIAGNOSIS — K319 Disease of stomach and duodenum, unspecified: Secondary | ICD-10-CM | POA: Diagnosis not present

## 2023-02-24 DIAGNOSIS — K2970 Gastritis, unspecified, without bleeding: Secondary | ICD-10-CM | POA: Diagnosis not present

## 2023-02-24 DIAGNOSIS — K635 Polyp of colon: Secondary | ICD-10-CM | POA: Diagnosis not present

## 2023-02-24 MED ORDER — SODIUM CHLORIDE 0.9 % IV SOLN: 500.0000 mL | Freq: Once | INTRAVENOUS | Status: DC

## 2023-02-24 NOTE — Progress Notes (Signed)
1435 Ephedrine 10 mg given IV due to low BP, MD updated.

## 2023-02-24 NOTE — Op Note (Addendum)
[facility] Patient Name: [name] Procedure Date: 02/24/2023 2:21 PM MRN: [id number] Endoscopist: Iva Boop , MD, [id number] Age: 62 Referring MD:  Date of Birth: 1960/07/20 Gender: Male Account #: [id number] Procedure:                Upper GI endoscopy Indications:              Nausea with vomiting Medicines:                Monitored Anesthesia Care Procedure:                Pre-Anesthesia Assessment:                           - Prior to the procedure, a History and Physical                            was performed, and patient medications and                            allergies were reviewed. The patient's tolerance of                            previous anesthesia was also reviewed. The risks                            and benefits of the procedure and the sedation                            options and risks were discussed with the patient.                            All questions were answered, and informed consent                            was obtained. Prior Anticoagulants: The patient                            last took Plavix (clopidogrel) 5 days prior to the                            procedure. ASA Grade Assessment: III - A patient                            with severe systemic disease. After reviewing the                            risks and benefits, the patient was deemed in                            satisfactory condition to undergo the procedure.                           After obtaining informed consent, the endoscope was  passed under direct vision. Throughout the                            procedure, the patient's blood pressure, pulse, and                            oxygen saturations were monitored continuously. The                            GIF [id number] #[id number] was introduced through the                            mouth, and advanced to the second part of duodenum.                            The upper GI endoscopy  was accomplished without                            difficulty. The patient tolerated the procedure                            well. Scope In: Scope Out: Findings:                 Patchy moderate inflammation characterized by                            congestion (edema), erosions, erythema and                            friability was found in the gastric body and in the                            gastric antrum. Biopsies were taken with a cold                            forceps for histology. Verification of patient                            identification for the specimen was done. Estimated                            blood loss was minimal.                           The exam was otherwise without abnormality.                           The cardia and gastric fundus were normal on                            retroflexion. Complications:            No immediate complications. Estimated Blood Loss:     Estimated blood loss was minimal. Impression:               -  Gastritis. Biopsied.                           - The examination was otherwise normal. Recommendation:           - Patient has a contact number available for                            emergencies. The signs and symptoms of potential                            delayed complications were discussed with the                            patient. Return to normal activities tomorrow.                            Written discharge instructions were provided to the                            patient.                           - Resume previous diet.                           - Continue present medications.                           - Await pathology results.                           - See the other procedure note for documentation of                            additional recommendations. Iva Boop, MD 02/24/2023 3:01:39 PM This report has been signed electronically.

## 2023-02-24 NOTE — Progress Notes (Signed)
1419 Robinul 0.1 mg IV given due large amount of secretions upon assessment.  MD made aware, vss  °

## 2023-02-24 NOTE — Op Note (Signed)
[facility] Patient Name: [name] Procedure Date: 02/24/2023 2:19 PM MRN: [id number] Endoscopist: Iva Boop , MD, [id number] Age: 62 Referring MD:  Date of Birth: 1960/09/21 Gender: Male Account #: [id number] Procedure:                Colonoscopy Indications:              Diarrhea Medicines:                Monitored Anesthesia Care Procedure:                Pre-Anesthesia Assessment:                           - Prior to the procedure, a History and Physical                            was performed, and patient medications and                            allergies were reviewed. The patient's tolerance of                            previous anesthesia was also reviewed. The risks                            and benefits of the procedure and the sedation                            options and risks were discussed with the patient.                            All questions were answered, and informed consent                            was obtained. Prior Anticoagulants: The patient has                            taken no anticoagulant or antiplatelet agents. ASA                            Grade Assessment: III - A patient with severe                            systemic disease. After reviewing the risks and                            benefits, the patient was deemed in satisfactory                            condition to undergo the procedure.                           After obtaining informed consent, the colonoscope  was passed under direct vision. Throughout the                            procedure, the patient's blood pressure, pulse, and                            oxygen saturations were monitored continuously. The                            CF [id number] #[id number] was introduced through the anus                            and advanced to the the terminal ileum, with                            identification of the appendiceal orifice and IC                             valve. The colonoscopy was performed without                            difficulty. The patient tolerated the procedure                            well. The quality of the bowel preparation was                            good. The terminal ileum, ileocecal valve,                            appendiceal orifice, and rectum were photographed. Scope In: 2:37:25 PM Scope Out: 2:52:24 PM Scope Withdrawal Time: 0 hours 11 minutes 28 seconds  Total Procedure Duration: 0 hours 14 minutes 59 seconds  Findings:                 The perianal and digital rectal examinations were                            normal. Pertinent negatives include normal prostate                            (size, shape, and consistency).                           The terminal ileum appeared normal.                           Two sessile polyps were found in the sigmoid colon.                            The polyps were diminutive in size. These polyps                            were removed with a cold snare. Resection and  retrieval were complete. Verification of patient                            identification for the specimen was done. Estimated                            blood loss was minimal.                           Scattered small-mouthed diverticula were found in                            the entire colon.                           The exam was otherwise without abnormality on                            direct and retroflexion views. Complications:            No immediate complications. Estimated Blood Loss:     Estimated blood loss was minimal. Impression:               - The examined portion of the ileum was normal.                           - Two diminutive polyps in the sigmoid colon,                            removed with a cold snare. Resected and retrieved.                           - Diverticulosis in the entire examined colon.                           -  The examination was otherwise normal on direct                            and retroflexion views. Diarrhea was likely from                            EtOH (he has been to detox and working on                            abstinence) Recommendation:           - Patient has a contact number available for                            emergencies. The signs and symptoms of potential                            delayed complications were discussed with the                            patient. Return to normal  activities tomorrow.                            Written discharge instructions were provided to the                            patient.                           - Resume previous diet.                           - Continue present medications.                           - Resume Plavix (clopidogrel) at prior dose                            tomorrow.                           - Repeat colonoscopy is recommended. The                            colonoscopy date will be determined after pathology                            results from today's exam become available for                            review.                           - MY OFFICE WILL ORDER MRI KIDNEYS WITH AND WITHOUT                            CONTRAST TO EVALUATE ABNORMAL KIDNEY ON CT 10/2022 Iva Boop, MD 02/24/2023 3:06:57 PM This report has been signed electronically.

## 2023-02-24 NOTE — Progress Notes (Signed)
Report given to PACU, vss 

## 2023-02-24 NOTE — Progress Notes (Signed)
Called to room to assist during endoscopic procedure.  Patient ID and intended procedure confirmed with present staff. Received instructions for my participation in the procedure from the performing physician.  

## 2023-02-24 NOTE — Progress Notes (Signed)
[facility] History and Physical   Primary Care Physician:  Ivonne Andrew, NP   Reason for Procedure:    Encounter Diagnoses  Name Primary?   Nausea and vomiting, unspecified vomiting type Yes   Diarrhea, unspecified type      Plan:    EGD and colonoscopy     HPI: NOTNAMED [name] is a 62 y.o. male seen by Quentin Mulling, PA-C 7/22/24w/ follwing issues   62 year old male with history of polysubstance abuse including alcohol and cocaine, type 2 diabetes, presents with nausea, vomiting, diarrhea and abdominal pain after binge drinking.   Unremarkable CT abdomen pelvis 10/2022 except:3. Indeterminate lesion in the cortex of the RIGHT kidney. Recommend MRI with and without contrast for further characterization. Previous normal colonoscopy 12/2012    Symptoms do appear to coincide with binge drinking alcohol, Long discussion with patient about alcohol/substance abuse, continue NA/AA, discussed with primary care, consider inpatient/outpatient program.   Patient declines refill of Reglan/dicyclomine states he does not need medication at this time. Will send in Protonix 40 mg once daily for possible alcohol/cocaine induced gastritis, esophagitis, peptic ulcer disease. Will plan for EGD and colonoscopy at [facility] , long discussion about if patient uses cocaine or alcohol will need to cancel the procedure, discussed risks including death.  Patient agrees. May also have some gastroparesis involved with longstanding history of diabetes, given gastroparesis diet, GERD information.   CAD S/P percutaneous coronary angioplasty Hold Plavix for 5 days before procedure  will instruct when and how to resume after procedure.  Patient understands that there is a low but real risk of cardiovascular event such as heart attack, stroke, or embolism /  thrombosis, or ischemia while off Coumadin.  The patient consents to proceed.  Will communicate by phone or EMR with patient's prescribing provider  to confirm that holding Coumadin is reasonable in this case.    Type 2 diabetes mellitus with other circulatory complication, without long-term current use of insulin ([name]) Last A1c 7.3, possible gastroparesis associated with symptoms though most likely related alcohol use.  Counseled   History of substance abuse ([name]) and alcohol dependence Suggest follow-up with primary care, has follow-up on 24th or 25th, discuss other options for polysubstance abuse. -Alcohol Abstinence counseling discussed with patient as continued use is strongly associated with worsening liver disease progression - get on MVIT - resources given, discuss with PCP -No elevation of LFTs, no evidence of cirrhosis   Renal cyst Seen on CT abdomen pelvis in the ER, has follow-up with primary care, they suggest follow-up MRI with and without. Past Medical History:  Diagnosis Date   Alcohol dependence ([name])    Allergies    Arthritis    Chest pain    Chronic lower back pain    Chronic pain of right wrist    Cocaine abuse ([name])    per note on 01/18/23   Coronary artery disease    a. Multiple prior caths/PCI. Cath 2013 with possible spasm of RCA, 70% ISR of mid LCx with subsequent DES to mLCx and prox LCX. b. H/o microvascular angina. c. Recurrent angina 08/2014 - s/p PTCA/DES to prox Cx, PTCA/CBA to OM1.  c. LHC 06/10/15 with patent stents and some ISR in LCX and OM-1 that was not flow limiting --> Rx    Dyslipidemia    a. Intolerant to many statins except tolerating Livalo.   GERD (gastroesophageal reflux disease)    H/O cardiac catheterization 10/25/2018   Heart attack [facility])    Hypertension  Myocardial infarction [facility]) ~ 2010   S/P angioplasty with stent, DES, to proximal and mid LCX 12/15/11 12/15/2011   S/P foot surgery, right 04/2021   Shoulder pain    Stroke ([name])    pt. reports had a stroke around time of MI 2010   Type II diabetes mellitus ([name])    Unstable angina [facility])     Past Surgical History:   Procedure Laterality Date   CARDIAC CATHETERIZATION  06/15/2002   LAD with prox 40% stenosis, norma L main, Cfx with 25% lesion, RCA with long mid 25% stenosis (Dr. Daiva Nakayama)   CARDIAC CATHETERIZATION  04/01/2010   normal L main, LAD wit mild stenosis, L Cfx with 70% in-stent restenosis, RCA with 70% in-stent restenosis, LVEF >60% (Dr. K. Italy Hilty) - cutting ballon arthrectomy to RCA & Cfx (Dr. Langston Reusing)   CARDIAC CATHETERIZATION  08/25/2010   preserved global LV contractility; multivessel CAD, diffuse 90-95% in-stent restenosis in prox placed Cfx stent - cutting balloon arthrectomy in Cfx with multiple dilatations 90-95% to 0% stenosis (Dr. Bishop Limbo)   CARDIAC CATHETERIZATION  01/26/2011   PCI & stenting of aggresive in-stent restenosis within previously stented AV groove Cfx with 3.0x2mm Taxus DES (previous stents were Promus) (Dr. Erlene Quan)   CARDIAC CATHETERIZATION  05/11/2011   preserved LV function, 40% mid LAD stenosis, 30-40% narrowing proximal to stented semgnet of prox Cfx, patent mid RCA stent with smooth 20% narrowing in distal RCA (Dr. Bishop Limbo)   CARDIAC CATHETERIZATION  12/15/2011   PCI & stenting of proximal & mid Cfx with DES - 3.0x2mm in proximal, 3.0x33mm in mid (Dr. Erlene Quan)   CARDIAC CATHETERIZATION N/A 06/10/2015   Procedure: Left Heart Cath and Coronary Angiography;  Surgeon: Dolores Patty, MD;  Location: [facility] INVASIVE CV LAB;  Service: Cardiovascular;  Laterality: N/A;   CARDIAC CATHETERIZATION  10/25/2018   cardiometabolic testing  08/08/2012   good exercise effort, peak VO2 79% predicted with normal VO2 HR curves (mild deconditioning)   COLONOSCOPY  12/2012   diminutive hyperplastic sigmoid poyp so repeat routine 2024   CORONARY BALLOON ANGIOPLASTY N/A 10/25/2018   Procedure: CORONARY BALLOON ANGIOPLASTY;  Surgeon: Corky Crafts, MD;  Location: [facility] INVASIVE CV LAB;  Service: Cardiovascular;  Laterality: N/A;   CORONARY BALLOON ANGIOPLASTY N/A 09/29/2019    Procedure: CORONARY BALLOON ANGIOPLASTY;  Surgeon: Yvonne Kendall, MD;  Location: [facility] INVASIVE CV LAB;  Service: Cardiovascular;  Laterality: N/A;   CORONARY ULTRASOUND/IVUS N/A 09/29/2019   Procedure: Intravascular Ultrasound/IVUS;  Surgeon: Yvonne Kendall, MD;  Location: [facility] INVASIVE CV LAB;  Service: Cardiovascular;  Laterality: N/A;   EXCISIONAL HEMORRHOIDECTOMY  1984   LEFT HEART CATH AND CORONARY ANGIOGRAPHY N/A 10/25/2018   Procedure: LEFT HEART CATH AND CORONARY ANGIOGRAPHY;  Surgeon: Corky Crafts, MD;  Location: [facility] INVASIVE CV LAB;  Service: Cardiovascular;  Laterality: N/A;   LEFT HEART CATH AND CORONARY ANGIOGRAPHY N/A 09/29/2019   Procedure: LEFT HEART CATH AND CORONARY ANGIOGRAPHY;  Surgeon: Yvonne Kendall, MD;  Location: [facility] INVASIVE CV LAB;  Service: Cardiovascular;  Laterality: N/A;   LEFT HEART CATHETERIZATION WITH CORONARY ANGIOGRAM N/A 05/11/2011   Procedure: LEFT HEART CATHETERIZATION WITH CORONARY ANGIOGRAM;  Surgeon: Lennette Bihari, MD;  Location: [facility] CATH LAB;  Service: Cardiovascular;  Laterality: N/A;  Possible percutaneous coronary intervention, possible IVUS   LEFT HEART CATHETERIZATION WITH CORONARY ANGIOGRAM N/A 12/15/2011   Procedure: LEFT HEART CATHETERIZATION WITH CORONARY ANGIOGRAM;  Surgeon: Runell Gess, MD;  Location: [facility] CATH LAB;  Service: Cardiovascular;  Laterality: N/A;   LEFT HEART CATHETERIZATION WITH CORONARY ANGIOGRAM N/A 09/05/2014   Procedure: LEFT HEART CATHETERIZATION WITH CORONARY ANGIOGRAM;  Surgeon: Kathleene Hazel, MD;  Location: [facility] CATH LAB;  Service: Cardiovascular;  Laterality: N/A;   LIPOMA EXCISION     back of the head   NM MYOCAR PERF WALL MOTION  02/2012   lexiscan myoview; mild perfusion defect in mid inferolateral & basal inferolateral region (infarct/scar); EF 52%, abnormal but ow risk scan   PERCUTANEOUS CORONARY STENT INTERVENTION (PCI-S)  09/05/2014   Procedure: PERCUTANEOUS CORONARY STENT INTERVENTION (PCI-S);  Surgeon:  Kathleene Hazel, MD;  Location: [facility] CATH LAB;  Service: Cardiovascular;;   TONSILLECTOMY      Prior to Admission medications   Medication Sig Start Date End Date Taking? Authorizing Provider  amLODipine (NORVASC) 10 MG tablet Take 1 tablet (10 mg total) by mouth daily. 02/15/23  Yes Ivonne Andrew, NP  aspirin EC 81 MG tablet Take 1 tablet (81 mg total) by mouth daily. Swallow whole. 02/19/23 03/21/23 Yes Augusto Gamble, MD  ezetimibe (ZETIA) 10 MG tablet Take 1 tablet (10 mg total) by mouth daily. 02/15/23  Yes Ivonne Andrew, NP  glucose blood (ACCU-CHEK GUIDE) test strip USE 1 STRIP TO CHECK GLUCOSE THREE TIMES DAILY AS DIRECTED 01/04/23  Yes Ivonne Andrew, NP  LIVALO 4 MG TABS Take 1 tablet by mouth once daily 02/16/22  Yes Croitoru, Mihai, MD  metFORMIN (GLUCOPHAGE-XR) 500 MG 24 hr tablet Take 1 tablet (500 mg total) by mouth daily with breakfast. 02/15/23 05/16/23 Yes Ivonne Andrew, NP  metoprolol tartrate (LOPRESSOR) 100 MG tablet Take 1 tablet (100 mg total) by mouth 2 (two) times daily. NEED OV. 02/15/23  Yes Ivonne Andrew, NP  Multiple Vitamin (MULTIVITAMIN WITH MINERALS) TABS tablet Take 1 tablet by mouth daily. 08/13/22  Yes Park Pope, MD  vitamin B-12 (CYANOCOBALAMIN) 100 MCG tablet Take 1 tablet (100 mcg total) by mouth daily. 02/19/23 03/21/23 Yes Augusto Gamble, MD  Cholecalciferol 25 MCG (1000 UT) tablet Take 1,000 Units by mouth daily.    [provider]  clopidogrel (PLAVIX) 75 MG tablet Take 1 tablet (75 mg total) by mouth daily. 02/15/23   Ivonne Andrew, NP  EPINEPHrine 0.3 mg/0.3 mL IJ SOAJ injection Inject 0.3 mg into the muscle as needed for anaphylaxis. 05/07/21   Verlee Monte, MD  ibuprofen (ADVIL) 800 MG tablet Take 1 tablet (800 mg total) by mouth every 8 (eight) hours as needed. Patient taking differently: Take 800 mg by mouth every 8 (eight) hours as needed for mild pain. 02/15/23   Ivonne Andrew, NP  mirtazapine (REMERON) 15 MG tablet Take 1 tablet  (15 mg total) by mouth at bedtime. Patient not taking: Reported on 02/24/2023 02/19/23 03/21/23  Augusto Gamble, MD  nitroGLYCERIN (NITROSTAT) 0.4 MG SL tablet Place 1 tablet (0.4 mg total) under the tongue every 5 (five) minutes x 3 doses as needed for chest pain. 07/18/21   Hilty, Lisette Abu, MD  Polyethyl Glycol-Propyl Glycol (SYSTANE) 0.4-0.3 % GEL ophthalmic gel Place 1 Application into both eyes daily as needed (For dry eyes).    [provider]  Probiotic Product (RESTORA) CAPS Take 1 capsule by mouth daily. 11/20/22   Ivonne Andrew, NP  sertraline (ZOLOFT) 50 MG tablet Take 1 tablet (50 mg total) by mouth daily. 02/19/23 03/21/23  Augusto Gamble, MD  sildenafil (VIAGRA) 100 MG tablet Take 100 mg by mouth daily as needed  for erectile dysfunction. 10/17/22   [provider]  Testosterone 1.62 % GEL PLACE 1 PUMP ONTO THE SKIN DAILY. Patient taking differently: Apply 1 Pump topically daily. 02/01/23   Ivonne Andrew, NP  traZODone (DESYREL) 50 MG tablet Take 1 tablet (50 mg total) by mouth at bedtime as needed for sleep. Patient taking differently: Take 50 mg by mouth at bedtime. 02/15/23   Ivonne Andrew, NP    Current Outpatient Medications  Medication Sig Dispense Refill   amLODipine (NORVASC) 10 MG tablet Take 1 tablet (10 mg total) by mouth daily. 90 tablet 0   aspirin EC 81 MG tablet Take 1 tablet (81 mg total) by mouth daily. Swallow whole. 30 tablet 0   ezetimibe (ZETIA) 10 MG tablet Take 1 tablet (10 mg total) by mouth daily. 90 tablet 0   glucose blood (ACCU-CHEK GUIDE) test strip USE 1 STRIP TO CHECK GLUCOSE THREE TIMES DAILY AS DIRECTED 300 each 0   LIVALO 4 MG TABS Take 1 tablet by mouth once daily 90 tablet 3   metFORMIN (GLUCOPHAGE-XR) 500 MG 24 hr tablet Take 1 tablet (500 mg total) by mouth daily with breakfast. 30 tablet 2   metoprolol tartrate (LOPRESSOR) 100 MG tablet Take 1 tablet (100 mg total) by mouth 2 (two) times daily. NEED OV. 180 tablet 0   Multiple  Vitamin (MULTIVITAMIN WITH MINERALS) TABS tablet Take 1 tablet by mouth daily.     vitamin B-12 (CYANOCOBALAMIN) 100 MCG tablet Take 1 tablet (100 mcg total) by mouth daily. 30 tablet 0   Cholecalciferol 25 MCG (1000 UT) tablet Take 1,000 Units by mouth daily.     clopidogrel (PLAVIX) 75 MG tablet Take 1 tablet (75 mg total) by mouth daily. 30 tablet 2   EPINEPHrine 0.3 mg/0.3 mL IJ SOAJ injection Inject 0.3 mg into the muscle as needed for anaphylaxis. 1 each 2   ibuprofen (ADVIL) 800 MG tablet Take 1 tablet (800 mg total) by mouth every 8 (eight) hours as needed. (Patient taking differently: Take 800 mg by mouth every 8 (eight) hours as needed for mild pain.) 30 tablet 0   mirtazapine (REMERON) 15 MG tablet Take 1 tablet (15 mg total) by mouth at bedtime. (Patient not taking: Reported on 02/24/2023) 30 tablet 0   nitroGLYCERIN (NITROSTAT) 0.4 MG SL tablet Place 1 tablet (0.4 mg total) under the tongue every 5 (five) minutes x 3 doses as needed for chest pain. 25 tablet 3   Polyethyl Glycol-Propyl Glycol (SYSTANE) 0.4-0.3 % GEL ophthalmic gel Place 1 Application into both eyes daily as needed (For dry eyes).     Probiotic Product (RESTORA) CAPS Take 1 capsule by mouth daily. 30 capsule 2   sertraline (ZOLOFT) 50 MG tablet Take 1 tablet (50 mg total) by mouth daily. 30 tablet 0   sildenafil (VIAGRA) 100 MG tablet Take 100 mg by mouth daily as needed for erectile dysfunction.     Testosterone 1.62 % GEL PLACE 1 PUMP ONTO THE SKIN DAILY. (Patient taking differently: Apply 1 Pump topically daily.) 75 g 0   traZODone (DESYREL) 50 MG tablet Take 1 tablet (50 mg total) by mouth at bedtime as needed for sleep. (Patient taking differently: Take 50 mg by mouth at bedtime.) 30 tablet 0   Current Facility-Administered Medications  Medication Dose Route Frequency Provider Last Rate Last Admin   0.9 %  sodium chloride infusion  500 mL Intravenous Once Iva Boop, MD        Allergies  as of 02/24/2023 -  Review Complete 02/24/2023  Allergen Reaction Noted   Bee venom Anaphylaxis, Hives, Itching, and Other (See Comments) 05/09/2011   Gadolinium Hives, Itching, Swelling, and Other (See Comments) 04/09/2021   Shellfish allergy Anaphylaxis, Hives, Itching, and Other (See Comments) 05/09/2011   Statins Other (See Comments) 02/18/2014   Testosterone cypionate Other (See Comments) 12/11/2016    Family History  Problem Relation Age of Onset   Leukemia Mother    Prostate cancer Father    Cancer Brother    Coronary artery disease Paternal Grandmother    Cancer Paternal Grandfather    Migraines Neg Hx    Headache Neg Hx    Colon cancer Neg Hx    Esophageal cancer Neg Hx    Rectal cancer Neg Hx    Stomach cancer Neg Hx     Social History   Socioeconomic History   Marital status: Widowed    Spouse name: Not on file   Number of children: 2   Years of education: GED   Highest education level: Some college, no degree  Occupational History   Not on file  Tobacco Use   Smoking status: Former    Current packs/day: 0.00    Average packs/day: 1 pack/day for 10.0 years (10.0 ttl pk-yrs)    Types: Cigarettes    Start date: 10/06/2008    Quit date: 10/07/2018    Years since quitting: 4.3   Smokeless tobacco: Never  Vaping Use   Vaping status: Never Used  Substance and Sexual Activity   Alcohol use: Not Currently    Comment: 2 bottles of wine on 12/08/2019 (one occurrence)   Drug use: Not Currently    Types: Cocaine   Sexual activity: Not Currently  Other Topics Concern   Not on file  Social History Narrative   ** Merged History Encounter **    Pt states he only smoke when he drinks   Social Determinants of Health   Financial Resource Strain: Low Risk  (12/11/2021)   Overall Financial Resource Strain (CARDIA)    Difficulty of Paying Living Expenses: Not very hard  Food Insecurity: Food Insecurity Present (11/12/2022)   Hunger Vital Sign    Worried About Running Out of Food in the  Last Year: Never true    Ran Out of Food in the Last Year: Sometimes true  Transportation Needs: No Transportation Needs (11/12/2022)   PRAPARE - Administrator, Civil Service (Medical): No    Lack of Transportation (Non-Medical): No  Physical Activity: Insufficiently Active (11/12/2022)   Exercise Vital Sign    Days of Exercise per Week: 3 days    Minutes of Exercise per Session: 30 min  Stress: No Stress Concern Present (11/12/2022)   [employer] of Occupational Health - Occupational Stress Questionnaire    Feeling of Stress : Only a little  Social Connections: Unknown (12/29/2022)   Received from [facility]   Social Network    Social Network: Not on file  Intimate Partner Violence: Unknown (12/29/2022)   Received from [facility]   HITS    Physically Hurt: Not on file    Insult or Talk Down To: Not on file    Threaten Physical Harm: Not on file    Scream or Curse: Not on file    Review of Systems:  All other review of systems negative except as mentioned in the HPI.  Physical Exam: Vital signs BP (!) 142/81   Pulse 96   Temp (!)  97.1 F (36.2 C)   Ht 5\' 8"  (1.727 m)   Wt 191 lb (86.6 kg)   SpO2 96%   BMI 29.04 kg/m   General:   Alert,  Well-developed, well-nourished, pleasant and cooperative in NAD Lungs:  Clear throughout to auscultation.   Heart:  Regular rate and rhythm; no murmurs, clicks, rubs,  or gallops. Abdomen:  Soft, nontender and nondistended. Normal bowel sounds.   Neuro/Psych:  Alert and cooperative. Normal mood and affect. A and O x 3   @Kevontay Burks  Sena Slate, MD, [facility] [phone] (pager) 02/24/2023 2:15 PM@

## 2023-02-24 NOTE — Patient Instructions (Addendum)
There was some inflammation in the stomach that I biopsied and will let you know.  The colonoscopy revealed 2 tiny polyps (removed) and diverticulosis.  Looking at the CT scan from April - there was an abnormality of the kidney that needs to be checked with an MRI. My office will order that.  I will let you know pathology results and when to have another routine colonoscopy by mail and/or My Chart.  I appreciate the opportunity to care for you. Iva Boop, MD, [facility]  Recommendation:           - Patient has a contact number available for                            emergencies. The signs and symptoms of potential                            delayed complications were discussed with the                            patient. Return to normal activities tomorrow.                            Written discharge instructions were provided to the                            patient.                           - Resume previous diet.                           - Continue present medications.                           - Resume Plavix (clopidogrel) at prior dose                            tomorrow.                           - Repeat colonoscopy is recommended. The                            colonoscopy date will be determined after pathology                            results from today's exam become available for                            review.                           - MY OFFICE WILL ORDER MRI KIDNEYS WITH AND WITHOUT                            CONTRAST TO EVALUATE ABNORMAL KIDNEY ON CT 10/2022  Recommendation:           -  Patient has a contact number available for                            emergencies. The signs and symptoms of potential                            delayed complications were discussed with the                            patient. Return to normal activities tomorrow.                            Written discharge instructions were provided to the                             patient.                           - Resume previous diet.                           - Continue present medications.                           - Await pathology results.                           - See the other procedure note for documentation of                            additional recommendations.  Handouts given on polyps and diverticulosis.  YOU HAD AN ENDOSCOPIC PROCEDURE TODAY AT THE [facility]:   Refer to the procedure report that was given to you for any specific questions about what was found during the examination.  If the procedure report does not answer your questions, please call your gastroenterologist to clarify.  If you requested that your care partner not be given the details of your procedure findings, then the procedure report has been included in a sealed envelope for you to review at your convenience later.  YOU SHOULD EXPECT: Some feelings of bloating in the abdomen. Passage of more gas than usual.  Walking can help get rid of the air that was put into your GI tract during the procedure and reduce the bloating. If you had a lower endoscopy (such as a colonoscopy or flexible sigmoidoscopy) you may notice spotting of blood in your stool or on the toilet paper. If you underwent a bowel prep for your procedure, you may not have a normal bowel movement for a few days.  Please Note:  You might notice some irritation and congestion in your nose or some drainage.  This is from the oxygen used during your procedure.  There is no need for concern and it should clear up in a day or so.  SYMPTOMS TO REPORT IMMEDIATELY:  Following lower endoscopy (colonoscopy or flexible sigmoidoscopy):  Excessive amounts of blood in the stool  Significant tenderness or worsening of abdominal pains  Swelling of the abdomen that is new, acute  Fever of 100F or higher  Following upper endoscopy (EGD)  Vomiting of blood or coffee ground material  New chest pain or pain under the  shoulder blades  Painful or persistently difficult swallowing  New shortness of breath  Fever of 100F or higher  Black, tarry-looking stools  For urgent or emergent issues, a gastroenterologist can be reached at any hour by calling [phone]. Do not use MyChart messaging for urgent concerns.    DIET:  We do recommend a small meal at first, but then you may proceed to your regular diet.  Drink plenty of fluids but you should avoid alcoholic beverages for 24 hours.  ACTIVITY:  You should plan to take it easy for the rest of today and you should NOT DRIVE or use heavy machinery until tomorrow (because of the sedation medicines used during the test).    FOLLOW UP: Our staff will call the number listed on your records the next business day following your procedure.  We will call around 7:15- 8:00 am to check on you and address any questions or concerns that you may have regarding the information given to you following your procedure. If we do not reach you, we will leave a message.     If any biopsies were taken you will be contacted by phone or by letter within the next 1-3 weeks.  Please call us at [phone] if you have not heard about the biopsies in 3 weeks.    SIGNATURES/CONFIDENTIALITY: You and/or your care partner have signed paperwork which will be entered into your electronic medical record.  These signatures attest to the fact that that the information above on your After Visit Summary has been reviewed and is understood.  Full responsibility of the confidentiality of this discharge information lies with you and/or your care-partner.

## 2023-02-25 ENCOUNTER — Telehealth: Payer: Self-pay

## 2023-02-25 ENCOUNTER — Telehealth: Payer: Self-pay | Admitting: *Deleted

## 2023-02-25 DIAGNOSIS — R93429 Abnormal radiologic findings on diagnostic imaging of unspecified kidney: Secondary | ICD-10-CM

## 2023-02-25 MED ORDER — PREDNISONE 50 MG PO TABS: ORAL_TABLET | ORAL | Status: DC

## 2023-02-25 NOTE — Telephone Encounter (Signed)
  Follow up Call-     02/24/2023    1:49 PM  Call back number  Post procedure Call Back phone  # [phone]  Permission to leave phone message Yes   [place]

## 2023-02-25 NOTE — Telephone Encounter (Signed)
I have left [name] a detailed message that I have set up the MRI abdomin w/wo contrast , renal protocol per Dr Marvell Fuller orders. The appointment date/time is as follows: 02/28/2023 at [facility], enter thru the ED and arrive at 10:30am for an 11:00am MRI. He is allergic to the Gadolinium so he will need the 13 hour prep. I told him to please call me back ASAP to confirm he got this message. If this date/time doesn't work he can call # [phone] to change it. I will send in the contrast allergy prep after speaking with him.

## 2023-02-28 ENCOUNTER — Ambulatory Visit (HOSPITAL_COMMUNITY)
Admission: RE | Admit: 2023-02-28 | Discharge: 2023-02-28 | Disposition: A | Discharge status: Home / Self Care | Payer: MEDICAID | Source: Ambulatory Visit | Attending: Internal Medicine | Admitting: Internal Medicine

## 2023-02-28 DIAGNOSIS — R93429 Abnormal radiologic findings on diagnostic imaging of unspecified kidney: Secondary | ICD-10-CM | POA: Diagnosis present

## 2023-02-28 MED ORDER — GADOBUTROL 1 MMOL/ML IV SOLN
9.0000 mL | Freq: Once | INTRAVENOUS | Status: AC | PRN
  Administered 2023-02-28: 9 mL via INTRAVENOUS

## 2023-03-02 ENCOUNTER — Encounter: Payer: Self-pay | Admitting: Internal Medicine

## 2023-03-09 ENCOUNTER — Encounter: Payer: Self-pay | Admitting: Physician Assistant

## 2023-03-09 ENCOUNTER — Ambulatory Visit: Payer: MEDICAID | Attending: Physician Assistant | Admitting: Physician Assistant

## 2023-03-09 VITALS — BP 106/62 | HR 63 | Ht 68.0 in | Wt 190.2 lb

## 2023-03-09 DIAGNOSIS — E785 Hyperlipidemia, unspecified: Secondary | ICD-10-CM | POA: Diagnosis not present

## 2023-03-09 DIAGNOSIS — I10 Essential (primary) hypertension: Secondary | ICD-10-CM | POA: Diagnosis not present

## 2023-03-09 DIAGNOSIS — Z9861 Coronary angioplasty status: Secondary | ICD-10-CM

## 2023-03-09 DIAGNOSIS — I2510 Atherosclerotic heart disease of native coronary artery without angina pectoris: Secondary | ICD-10-CM | POA: Diagnosis not present

## 2023-03-09 DIAGNOSIS — F1010 Alcohol abuse, uncomplicated: Secondary | ICD-10-CM

## 2023-03-09 DIAGNOSIS — Z01818 Encounter for other preprocedural examination: Secondary | ICD-10-CM

## 2023-03-09 NOTE — Patient Instructions (Signed)
Medication Instructions:  NO CHANGES *If you need a refill on your cardiac medications before your next appointment, please call your pharmacy*   Lab Work: NO LABS If you have labs (blood work) drawn today and your tests are completely normal, you will receive your results only by: MyChart Message (if you have MyChart) OR A paper copy in the mail If you have any lab test that is abnormal or we need to change your treatment, we will call you to review the results.   Testing/Procedures: NO TESTING   Follow-Up: At [facility], you and your health needs are our priority.  As part of our continuing mission to provide you with exceptional heart care, we have created designated Provider Care Teams.  These Care Teams include your primary Cardiologist (physician) and Advanced Practice Providers (APPs -  Physician Assistants and Nurse Practitioners) who all work together to provide you with the care you need, when you need it.  Your next appointment:   6 month(s)  Provider:   Chrystie Nose, MD

## 2023-03-09 NOTE — Progress Notes (Unsigned)
Cardiology Office Note:  .   Date:  03/11/2023  ID:  [name], DOB 10/10/1960, MRN [id number] PCP: Ivonne Andrew, NP  [facility] Providers Cardiologist:  Chrystie Nose, MD     History of Present Illness: .   [name] is a 62 y.o. male with PMH of CAD, hypertension, hyperlipidemia, DM2 and microvascular angina.  He had multiple PCI's and in-stent restenosis.  Cardiac catheterization performed on 10/25/2018 showed 80% in-stent restenosis in proximal left circumflex vessel, patent mid RCA stent, 80% OM2 in-stent restenosis.  Access from the right subclavian artery was very difficult.  He ultimately underwent balloon angioplasty for the left circumflex and OM2 lesion.  Unfortunately he developed restenosis in the left circumflex and OM 2 stent with 95% narrowing by April 2021 and underwent repeat balloon angioplasty reducing the lesion down to 30%.  He was left on dual antiplatelet therapy with instruction to continue for at least 12 months, ideally longer.  Echocardiogram obtained on 09/29/2019 showed EF 55 to 60%, grade 1 DD, trivial MR.  He was last seen in June 2023 at which point he has underwent to orthopedic surgery.  He otherwise he was doing well at the time.  Dr. Rennis Golden was concerned that he was drinking too much wine.  Since earlier this year, patient has been seen multiple times in the ED for abdominal discomfort, alcohol abuse and depression.  He has been evaluated by GI service who requested a colonoscopy and the EGD to further assess.  Patient presents today for follow-up.  He continued to have a drinking problem.  He says he binge drink from the moment he wake up until he passes out.  He is felt his drinking problem is largely driven by depression.  He denies any recent chest pain or shortness of breath.  He has not really had any recurrent angina for the past 3 years.  He was recently diagnosed with a renal mass.  MRI was performed.  He says he his PCP has referred him to  a kidney specialist to further assess.  ROS:   He denies chest pain, palpitations, dyspnea, pnd, orthopnea, n, v, dizziness, syncope, edema, weight gain, or early satiety. All other systems reviewed and are otherwise negative except as noted above.    Studies Reviewed: Marland Kitchen   EKG Interpretation Date/Time:  Tuesday March 09 2023 15:13:54 EDT Ventricular Rate:  63 PR Interval:  132 QRS Duration:  80 QT Interval:  372 QTC Calculation: 380 R Axis:   2  Text Interpretation: Normal sinus rhythm  No significant ST-T wave changes Confirmed by Azalee Course [phone]) on 03/11/2023 6:29:16 PM    Cardiac Studies & Procedures   CARDIAC CATHETERIZATION  CARDIAC CATHETERIZATION 09/29/2019  Narrative Conclusions: 1. Severe single-vessel coronary artery disease with diffuse in-stent restenosis of the LCx/OM2 stent with up to 95% narrowing.  There is also at least 80% stenosis involving the AV-groove LCx at the takeoff of OM2 with TIMI-2 flow at the start of the case and TIMI-1 flow following PTCA of the LCx/OM2 stents. 2. Mild to moderate stenosis of the mid LAD with improvement with intracoronary nitroglycerin, consistent with vasospasm. 3. Patent RCA stent. 4. Mildly elevated left ventricular filling pressure (LVEDP ~20 mmHg). 5. Challenging IVUS-guided balloon angioplasty of LCx/OM2 in-stent restenosis with reduction in stenosis from 95% to 30%.  Recommendations: 1. Aggressive medical therapy, including at least 12 months dual antiplatelet therapy (ideally longer). 2. Titrate IV nitroglycerin for relief of chest pain,  most likely due to TIMI-1 flow in jailed AV-groove LCx. 3. If patient has recurrent angina refractory to medical therapy and worsening in-stent restenosis, single-vessel CABG, brachytherapy, or drug-coated balloon angioplasty (currently investigational only) may need to be considered. 4. Aggressive secondary prevention.  Yvonne Kendall, MD [facility]  Findings Coronary  Findings Diagnostic  Dominance: Right  Left Anterior Descending Mid LAD lesion is 60% stenosed. Stenosis improved to 25% with intracoronary nitroglycerin, suggesting a component of vasospasm.  Left Circumflex Prox Cx lesion is 70% stenosed. The lesion was previously treated using a drug eluting stent and angioplasty between 6-12 months ago. Previously placed stent displays restenosis. Prox Cx to Mid Cx lesion is 50% stenosed. The lesion was previously treated. Mid Cx lesion is 70% stenosed.  Second Obtuse Marginal Branch Ost 2nd Mrg to 2nd Mrg lesion is 50% stenosed. The lesion was previously treated using a stent (unknown type) and angioplasty between 6-12 months ago. 2nd Mrg lesion is 95% stenosed. The lesion was previously treated using a stent (unknown type) and angioplasty between 6-12 months ago.  Right Coronary Artery The vessel exhibits minimal luminal irregularities. Non-stenotic Mid RCA lesion was previously treated.  Intervention  Prox Cx lesion Angioplasty (Also treats lesions: Prox Cx to Mid Cx) Scoring balloon angioplasty was performed using a BALLOON WOLVERINE 3.50X10. Post-Intervention Lesion Assessment The intervention was successful. Pre-interventional TIMI flow is 3. Post-intervention TIMI flow is 3. No complications occurred at this lesion. There is a 30% residual stenosis post intervention.  Prox Cx to Mid Cx lesion Angioplasty (Also treats lesions: Prox Cx) See details in Prox Cx lesion. Post-Intervention Lesion Assessment The intervention was successful. Pre-interventional TIMI flow is 3. Post-intervention TIMI flow is 3. There is a 30% residual stenosis post intervention.  Ost 2nd Mrg to 2nd Mrg lesion Angioplasty Scoring balloon angioplasty was performed using a BALLOON WOLVERINE 3.50X10. Post-Intervention Lesion Assessment The intervention was successful. Pre-interventional TIMI flow is 3. Post-intervention TIMI flow is 3. No complications occurred at  this lesion. There is a 30% residual stenosis post intervention.  2nd Mrg lesion Angioplasty Scoring balloon angioplasty was performed using a BALLOON WOLVERINE 3.00X10. Post-Intervention Lesion Assessment The intervention was successful. Pre-interventional TIMI flow is 2. Post-intervention TIMI flow is 3. No complications occurred at this lesion. There is a 30% residual stenosis post intervention.   CARDIAC CATHETERIZATION  CARDIAC CATHETERIZATION 10/25/2018  Narrative  Previously placed Mid RCA stent (unknown type) is widely patent.  Prox Cx lesion is 80% stenosed, instent restenosis.  Scoring balloon angioplasty was performed using a BALLOON WOLVERINE 3.50X10, followed by a 3.75 [place] balloon.  Post intervention, there is a 0% residual stenosis.  2nd Mrg lesion is 80% stenosed, instent restenosis.  Scoring balloon angioplasty was performed using a BALLOON WOLVERINE 3.50X10, followed by a 3.75 [place] balloon.  Post intervention, there is a 0% residual stenosis.  Mid LAD lesion is 25% stenosed.  Ost 2nd Mrg to 2nd Mrg lesion is 25% stenosed.  There is no aortic valve stenosis.  LV end diastolic pressure is normal.  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 Able to complete left circ PCI, but would not use right radial approach in the future.  Prox Cx to Mid Cx lesion is 20% stenosed.  Dual antiplatelet therapy for 12 months.  Consdier clopidogrel monotherapy after 1 year given the multiple layers of stents that he has.  Findings Coronary Findings Diagnostic  Dominance: Right  Left Anterior Descending Mid LAD lesion is 25% stenosed.  Left Circumflex Prox Cx lesion is 80% stenosed. The lesion was previously treated using a drug eluting stent over 2 years ago. Prox Cx to Mid Cx lesion is 20% stenosed. The lesion was previously treated.  Second Obtuse Marginal Branch Ost 2nd Mrg to 2nd Mrg lesion is 25% stenosed. The lesion  was previously treated. 2nd Mrg lesion is 80% stenosed. The lesion was previously treated.  Right Coronary Artery The vessel exhibits minimal luminal irregularities. Previously placed Mid RCA stent (unknown type) is widely patent.  Intervention  Prox Cx lesion Angioplasty CATHETER LAUNCHER 6FREBU 3.5 guide catheter was inserted. WIRE ASAHI PROWATER 180CM guidewire used to cross lesion. Scoring balloon angioplasty was performed using a BALLOON WOLVERINE 3.50X10. [place] followed by 3.75 [place] balloon inflations. Post-Intervention Lesion Assessment The intervention was successful. Pre-interventional TIMI flow is 3. Post-intervention TIMI flow is 3. No complications occurred at this lesion. There is a 0% residual stenosis post intervention.  2nd Mrg lesion Angioplasty WIRE ASAHI PROWATER 180CM guidewire used to cross lesion. Scoring balloon angioplasty was performed using a BALLOON WOLVERINE 3.50X10. Post-Intervention Lesion Assessment The intervention was successful. Pre-interventional TIMI flow is 3. Post-intervention TIMI flow is 3. No complications occurred at this lesion. There is a 0% residual stenosis post intervention.   STRESS TESTS  NM MYOCAR MULTI W/SPECT W 03/11/2012   ECHOCARDIOGRAM  ECHOCARDIOGRAM COMPLETE 09/29/2019  Narrative ECHOCARDIOGRAM REPORT    Patient Name:   [name] Date of Exam: 09/29/2019 Medical Rec #:  [id number]      Height:       68.0 in Accession #:    [id number]     Weight:       192.0 lb Date of Birth:  1960/07/15       BSA:          2.009 m Patient Age:    58 years       BP:           105/79 mmHg Patient Gender: M              HR:           64 bpm. Exam Location:  Inpatient  Procedure: 2D Echo  Indications:    Chest Pain R07.9  History:        Patient has no prior history of Echocardiogram examinations. Previous Myocardial Infarction and CAD; Risk Factors:Hypertension, Diabetes and Dyslipidemia.  Sonographer:    Thurman Coyer RDCS  (AE) Referring Phys: [id number] Beatriz Stallion  IMPRESSIONS   1. Normal LV systolic function; grade 1 diastolic dysfunction; mild LVH. 2. Left ventricular ejection fraction, by estimation, is 55 to 60%. The left ventricle has normal function. The left ventricle has no regional wall motion abnormalities. There is mild left ventricular hypertrophy. Left ventricular diastolic parameters are consistent with Grade I diastolic dysfunction (impaired relaxation). 3. Right ventricular systolic function is normal. The right ventricular size is normal. 4. The mitral valve is normal in structure. Trivial mitral valve regurgitation. No evidence of mitral stenosis. 5. The aortic valve is tricuspid. Aortic valve regurgitation is not visualized. No aortic stenosis is present. 6. The inferior vena cava is normal in size with greater than 50% respiratory variability, suggesting right atrial pressure of 3 mmHg.  FINDINGS Left Ventricle: Left ventricular ejection fraction, by estimation, is 55 to 60%. The left ventricle has normal function. The left ventricle has no regional wall motion abnormalities. The left ventricular internal cavity size was normal in size. There is mild left ventricular hypertrophy.  Left ventricular diastolic parameters are consistent with Grade I diastolic dysfunction (impaired relaxation).  Right Ventricle: The right ventricular size is normal. Right ventricular systolic function is normal.  Left Atrium: Left atrial size was normal in size.  Right Atrium: Right atrial size was normal in size.  Pericardium: There is no evidence of pericardial effusion.  Mitral Valve: The mitral valve is normal in structure. Normal mobility of the mitral valve leaflets. Mild mitral annular calcification. Trivial mitral valve regurgitation. No evidence of mitral valve stenosis.  Tricuspid Valve: The tricuspid valve is normal in structure. Tricuspid valve regurgitation is trivial. No evidence of  tricuspid stenosis.  Aortic Valve: The aortic valve is tricuspid. Aortic valve regurgitation is not visualized. No aortic stenosis is present.  Pulmonic Valve: The pulmonic valve was normal in structure. Pulmonic valve regurgitation is trivial. No evidence of pulmonic stenosis.  Aorta: The aortic root is normal in size and structure.  Venous: The inferior vena cava is normal in size with greater than 50% respiratory variability, suggesting right atrial pressure of 3 mmHg.  IAS/Shunts: No atrial level shunt detected by color flow Doppler.  Additional Comments: Normal LV systolic function; grade 1 diastolic dysfunction; mild LVH.   LEFT VENTRICLE PLAX 2D LVIDd:         3.80 cm  Diastology LVIDs:         2.70 cm  LV e' lateral:   9.68 cm/s LV PW:         1.10 cm  LV E/e' lateral: 5.5 LV IVS:        1.20 cm  LV e' medial:    6.09 cm/s LVOT diam:     2.20 cm  LV E/e' medial:  8.7 LV SV:         76 LV SV Index:   38 LVOT Area:     3.80 cm   RIGHT VENTRICLE RV S prime:     12.20 cm/s TAPSE (M-mode): 2.3 cm  LEFT ATRIUM             Index       RIGHT ATRIUM           Index LA diam:        3.50 cm 1.74 cm/m  RA Area:     13.30 cm LA Vol (A2C):   46.5 ml 23.15 ml/m RA Volume:   29.30 ml  14.59 ml/m LA Vol (A4C):   41.7 ml 20.76 ml/m LA Biplane Vol: 47.4 ml 23.60 ml/m AORTIC VALVE LVOT Vmax:   89.40 cm/s LVOT Vmean:  60.700 cm/s LVOT VTI:    0.199 m  AORTA Ao Root diam: 3.60 cm  MITRAL VALVE MV Area (PHT): 3.17 cm    SHUNTS MV Decel Time: 239 msec    Systemic VTI:  0.20 m MV E velocity: 53.10 cm/s  Systemic Diam: 2.20 cm MV A velocity: 61.70 cm/s MV E/A ratio:  0.86  Olga Millers MD Electronically signed by Olga Millers MD Signature Date/Time: 09/29/2019/9:37:03 AM    Final             Risk Assessment/Calculations:            Physical Exam:   VS:  BP 106/62 (BP Location: Left Arm, Patient Position: Sitting, Cuff Size: Large)   Pulse 63   Ht 5\' 8"   (1.727 m)   Wt 190 lb 3.2 oz (86.3 kg)   SpO2 98%   BMI 28.92 kg/m    Wt Readings from Last 3 Encounters:  03/09/23 190 lb 3.2 oz (86.3 kg)  02/24/23 191 lb (86.6 kg)  02/15/23 194 lb 6.4 oz (88.2 kg)    GEN: Well nourished, well developed in no acute distress NECK: No JVD; No carotid bruits CARDIAC: RRR, no murmurs, rubs, gallops RESPIRATORY:  Clear to auscultation without rales, wheezing or rhonchi  ABDOMEN: Soft, non-tender, non-distended EXTREMITIES:  No edema; No deformity   ASSESSMENT AND PLAN: .    CAD: Denies any recent chest pain.  Continue aspirin and Plavix  Hypertension: Blood pressure stable  Hyperlipidemia: Intolerant of statins  EtOH abuse: Continue to be an issue.  He says he binge drink from the moment he wake up until he passes out.  He has a history of depression that worsens alcohol abuse.  Will defer management of depression to primary care provider.  Emphasized on the importance of alcohol cessation.  He is aware of possibility of alcohol induced cardiomyopathy if he continued to drink.       Dispo: Follow-up in 6 month  Signed, Azalee Course, [place]

## 2023-03-10 ENCOUNTER — Other Ambulatory Visit: Payer: Self-pay | Admitting: Nurse Practitioner

## 2023-04-05 ENCOUNTER — Other Ambulatory Visit: Payer: Self-pay | Admitting: Cardiovascular Disease

## 2023-04-05 ENCOUNTER — Other Ambulatory Visit: Payer: Self-pay | Admitting: Nurse Practitioner

## 2023-05-10 ENCOUNTER — Telehealth: Payer: Self-pay

## 2023-05-10 NOTE — Transitions of Care (Post Inpatient/ED Visit) (Unsigned)
   05/10/2023  Name: [name] MRN: [id number] DOB: Jun 21, 1961  Today's TOC FU Call Status: Today's TOC FU Call Status:: Unsuccessful Call (1st Attempt) Unsuccessful Call (1st Attempt) Date: 05/10/23  Attempted to reach the patient regarding the most recent Inpatient/ED visit.  Follow Up Plan: Additional outreach attempts will be made to reach the patient to complete the Transitions of Care (Post Inpatient/ED visit) call.   Signature Agnes Lawrence, CMA (AAMA)  [facility] [phone]

## 2023-05-12 ENCOUNTER — Telehealth (HOSPITAL_COMMUNITY): Payer: Self-pay | Admitting: Licensed Clinical Social Worker

## 2023-05-12 NOTE — Telephone Encounter (Signed)
[name] a voicemail with this therapist saying that he is still out but will call this therapist back tomorrow at 9 AM.  [name], MA, LCSW, [facility], LCAS 05/12/2023

## 2023-05-12 NOTE — Telephone Encounter (Signed)
The therapist returns Chip's call; however, he asks if he can call this therapist back in 30 minutes as he is on the bus and has this therapist's direct contact number.  [name], MA, LCSW, [facility], LCAS 05/12/2023

## 2023-05-13 ENCOUNTER — Telehealth (HOSPITAL_COMMUNITY): Payer: Self-pay | Admitting: Licensed Clinical Social Worker

## 2023-05-13 NOTE — Telephone Encounter (Signed)
The therapist receives a call from [name] confirming his identity via two identifiers. He says that he is 14 days sober having recently been discharged from a detox program and is interested in attending the substance abuse intensive outpatient program.  [name] asks about transportation to and from the program and is given the toll-free number for [facility] to inquire about medication transportation to [facility].  He is scheduled to come in for an assessment on Monday, November 18, at 9 AM.  [name], [place], [place], [facility], LCAS 05/13/2023

## 2023-05-13 NOTE — Telephone Encounter (Signed)
The therapist attempts to reach [place] leaving another HIPAA compliant voicemail.  [name], MA, LCSW, [facility], LCAS 05/13/2023

## 2023-05-16 ENCOUNTER — Encounter (HOSPITAL_COMMUNITY): Payer: Self-pay

## 2023-05-16 ENCOUNTER — Encounter (HOSPITAL_COMMUNITY): Payer: Self-pay | Admitting: Physician Assistant

## 2023-05-16 ENCOUNTER — Other Ambulatory Visit: Payer: Self-pay

## 2023-05-16 ENCOUNTER — Ambulatory Visit (HOSPITAL_COMMUNITY)
Admission: RE | Admit: 2023-05-16 | Discharge: 2023-05-16 | Disposition: A | Discharge status: Home / Self Care | Payer: MEDICAID | Source: Ambulatory Visit | Attending: Physician Assistant | Admitting: Physician Assistant

## 2023-05-16 VITALS — BP 116/73 | HR 58 | Temp 97.5°F | Resp 18

## 2023-05-16 DIAGNOSIS — R350 Frequency of micturition: Secondary | ICD-10-CM

## 2023-05-16 DIAGNOSIS — N50819 Testicular pain, unspecified: Secondary | ICD-10-CM | POA: Diagnosis not present

## 2023-05-16 LAB — POCT URINALYSIS DIP (MANUAL ENTRY)
Blood, UA: NEGATIVE
Glucose, UA: NEGATIVE mg/dL
Leukocytes, UA: NEGATIVE
Nitrite, UA: NEGATIVE
Protein Ur, POC: NEGATIVE mg/dL
Spec Grav, UA: 1.025 (ref 1.010–1.025)
Urobilinogen, UA: 0.2 U/dL
pH, UA: 5.5 (ref 5.0–8.0)

## 2023-05-16 MED ORDER — LEVOFLOXACIN 500 MG PO TABS: 500.0000 mg | ORAL_TABLET | Freq: Every day | ORAL | 0 refills | Status: DC

## 2023-05-16 NOTE — ED Provider Notes (Signed)
[facility]    CSN: [id number] Arrival date & time: 05/16/23  1238      History   Chief Complaint Chief Complaint  Patient presents with   Appointment    13:00   Groin Pain    HPI [name] is a 62 y.o. male.   Patient presents today with a month plus long history of right sided groin/testicular discomfort.  He reports that the discomfort is constant and is not identified any alleviating or aggravating factors.  He also reports urinary frequency as well as difficulty initiating his stream.  He denies any dysuria, genital lesion, penile discharge.  He is not sexually active and has no concern for STI.  He denies any swelling in this area or history of inguinal hernia.  Denies previous abdominal surgeries.  He has not tried any over-the-counter medication for symptom management.  He denies history of BPH or prostatitis.  Denies any rectal pain or pain with bowel movement.  He is followed by urology for a renal mass that is being monitored but has not seen them for current symptoms.  Denies any recent antibiotic use.    Past Medical History:  Diagnosis Date   Alcohol dependence ([name])    Allergies    Arthritis    Chest pain    Chronic lower back pain    Chronic pain of right wrist    Cocaine abuse ([name])    per note on 01/18/23   Coronary artery disease    a. Multiple prior caths/PCI. Cath 2013 with possible spasm of RCA, 70% ISR of mid LCx with subsequent DES to mLCx and prox LCX. b. H/o microvascular angina. c. Recurrent angina 08/2014 - s/p PTCA/DES to prox Cx, PTCA/CBA to OM1.  c. LHC 06/10/15 with patent stents and some ISR in LCX and OM-1 that was not flow limiting --> Rx    Dyslipidemia    a. Intolerant to many statins except tolerating Livalo.   GERD (gastroesophageal reflux disease)    H/O cardiac catheterization 10/25/2018   Heart attack [facility])    Hypertension    Myocardial infarction ([name]) ~ 2010   S/P angioplasty with stent, DES, to proximal and mid  LCX 12/15/11 12/15/2011   S/P foot surgery, right 04/2021   Shoulder pain    Stroke [facility])    pt. reports had a stroke around time of MI 2010   Type II diabetes mellitus ([name])    Unstable angina [facility])     Patient Active Problem List   Diagnosis Date Noted   Anxiety disorder, unspecified 02/16/2023   Alcohol use disorder 02/16/2023   Sleep disorder, unspecified 02/16/2023   Alcohol abuse 02/15/2023   Alcohol dependence ([name]) 05/16/2022   Low energy 03/06/2022   Moderate alcohol dependence in early remission ([name]) 02/24/2022   Suicidal ideation 07/07/2021   Generalized anxiety disorder 07/07/2021   Other allergic rhinitis 09/09/2020   Adverse reaction to food, subsequent encounter 09/09/2020   History of bee sting allergy 09/09/2020   Alcohol dependence with alcohol-induced mood disorder ([name]) 03/19/2020   MDD (major depressive disorder), recurrent episode, severe ([name]) 03/18/2020   History of substance abuse ([name]) 01/04/2020   Severe 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 features ([name]) 12/10/2019   Tinea cruris 07/06/2019   Hemoglobin A1c less than 7.0% 07/06/2019   Muscle strain of wrist 04/05/2019   Pain in both wrists 04/05/2019   Anxiety 04/05/2019   Prediabetes 04/05/2019   MDD (major depressive disorder) 01/12/2019   Chest pain  10/25/2018   MDD (major depressive disorder), recurrent severe, without psychosis ([name]) 08/15/2018   Influenza A 07/17/2017   Lactic acidosis    Cough    Nausea and vomiting    Severe episode of recurrent major depressive disorder, without psychotic features ([name])    MDD (major depressive disorder), single episode, severe with psychotic features ([name]) 06/27/2017   MDD (major depressive disorder), severe ([name]) 06/23/2017   Essential hypertension 01/13/2016   Unstable angina ([name])    ED (erectile dysfunction) 02/15/2014   Atypical chest pain 12/14/2011   Allergy to contrast media (used for diagnostic x-rays) 05/12/2011   Dyslipidemia,  goal LDL below 70 05/09/2011   DM2 (diabetes mellitus, type 2) ([name]) 07/10/2010   CAD S/P percutaneous coronary angioplasty 07/10/2010    Past Surgical History:  Procedure Laterality Date   CARDIAC CATHETERIZATION  06/15/2002   LAD with prox 40% stenosis, norma L main, Cfx with 25% lesion, RCA with long mid 25% stenosis (Dr. Daiva Nakayama)   CARDIAC CATHETERIZATION  04/01/2010   normal L main, LAD wit mild stenosis, L Cfx with 70% in-stent restenosis, RCA with 70% in-stent restenosis, LVEF >60% (Dr. K. Italy Hilty) - cutting ballon arthrectomy to RCA & Cfx (Dr. Langston Reusing)   CARDIAC CATHETERIZATION  08/25/2010   preserved global LV contractility; multivessel CAD, diffuse 90-95% in-stent restenosis in prox placed Cfx stent - cutting balloon arthrectomy in Cfx with multiple dilatations 90-95% to 0% stenosis (Dr. Bishop Limbo)   CARDIAC CATHETERIZATION  01/26/2011   PCI & stenting of aggresive in-stent restenosis within previously stented AV groove Cfx with 3.0x16mm Taxus DES (previous stents were Promus) (Dr. Erlene Quan)   CARDIAC CATHETERIZATION  05/11/2011   preserved LV function, 40% mid LAD stenosis, 30-40% narrowing proximal to stented semgnet of prox Cfx, patent mid RCA stent with smooth 20% narrowing in distal RCA (Dr. Bishop Limbo)   CARDIAC CATHETERIZATION  12/15/2011   PCI & stenting of proximal & mid Cfx with DES - 3.0x76mm in proximal, 3.0x69mm in mid (Dr. Erlene Quan)   CARDIAC CATHETERIZATION N/A 06/10/2015   Procedure: Left Heart Cath and Coronary Angiography;  Surgeon: Dolores Patty, MD;  Location: [facility] INVASIVE CV LAB;  Service: Cardiovascular;  Laterality: N/A;   CARDIAC CATHETERIZATION  10/25/2018   cardiometabolic testing  08/08/2012   good exercise effort, peak VO2 79% predicted with normal VO2 HR curves (mild deconditioning)   COLONOSCOPY  12/2012   diminutive hyperplastic sigmoid poyp so repeat routine 2024   CORONARY BALLOON ANGIOPLASTY N/A 10/25/2018   Procedure: CORONARY BALLOON  ANGIOPLASTY;  Surgeon: Corky Crafts, MD;  Location: [facility] LAB;  Service: Cardiovascular;  Laterality: N/A;   CORONARY BALLOON ANGIOPLASTY N/A 09/29/2019   Procedure: CORONARY BALLOON ANGIOPLASTY;  Surgeon: Yvonne Kendall, MD;  Location: [facility] LAB;  Service: Cardiovascular;  Laterality: N/A;   CORONARY ULTRASOUND/IVUS N/A 09/29/2019   Procedure: Intravascular Ultrasound/IVUS;  Surgeon: Yvonne Kendall, MD;  Location: [facility] LAB;  Service: Cardiovascular;  Laterality: N/A;   EXCISIONAL HEMORRHOIDECTOMY  1984   LEFT HEART CATH AND CORONARY ANGIOGRAPHY N/A 10/25/2018   Procedure: LEFT HEART CATH AND CORONARY ANGIOGRAPHY;  Surgeon: Corky Crafts, MD;  Location: [facility] INVASIVE CV LAB;  Service: Cardiovascular;  Laterality: N/A;   LEFT HEART CATH AND CORONARY ANGIOGRAPHY N/A 09/29/2019   Procedure: LEFT HEART CATH AND CORONARY ANGIOGRAPHY;  Surgeon: Yvonne Kendall, MD;  [facility];  Service: Cardiovascular;  Laterality: N/A;   LEFT HEART CATHETERIZATION WITH  CORONARY ANGIOGRAM N/A 05/11/2011   Procedure: LEFT HEART CATHETERIZATION WITH CORONARY ANGIOGRAM;  Surgeon: Lennette Bihari, MD;  Location: [facility] CATH LAB;  Service: Cardiovascular;  Laterality: N/A;  Possible percutaneous coronary intervention, possible IVUS   LEFT HEART CATHETERIZATION WITH CORONARY ANGIOGRAM N/A 12/15/2011   Procedure: LEFT HEART CATHETERIZATION WITH CORONARY ANGIOGRAM;  Surgeon: Runell Gess, MD;  Location: [facility] CATH LAB;  Service: Cardiovascular;  Laterality: N/A;   LEFT HEART CATHETERIZATION WITH CORONARY ANGIOGRAM N/A 09/05/2014   Procedure: LEFT HEART CATHETERIZATION WITH CORONARY ANGIOGRAM;  Surgeon: Kathleene Hazel, MD;  Location: [facility] CATH LAB;  Service: Cardiovascular;  Laterality: N/A;   LIPOMA EXCISION     back of the head   NM MYOCAR PERF WALL MOTION  02/2012   lexiscan myoview; mild perfusion defect in mid inferolateral & basal inferolateral region (infarct/scar);  EF 52%, abnormal but ow risk scan   PERCUTANEOUS CORONARY STENT INTERVENTION (PCI-S)  09/05/2014   Procedure: PERCUTANEOUS CORONARY STENT INTERVENTION (PCI-S);  Surgeon: Kathleene Hazel, MD;  Location: [facility] CATH LAB;  Service: Cardiovascular;;   TONSILLECTOMY         Home Medications    Prior to Admission medications   Medication Sig Start Date End Date Taking? Authorizing Provider  levofloxacin (LEVAQUIN) 500 MG tablet Take 1 tablet (500 mg total) by mouth daily. 05/16/23  Yes Berle Fitz K, PA-C  ACCU-CHEK GUIDE test strip USE 1 STRIP TO CHECK GLUCOSE THREE TIMES DAILY AS DIRECTED 04/05/23   Ivonne Andrew, NP  amLODipine (NORVASC) 10 MG tablet Take 1 tablet (10 mg total) by mouth daily. 02/15/23   Ivonne Andrew, NP  Cholecalciferol 25 MCG (1000 UT) tablet Take 1,000 Units by mouth daily.    [provider]  clopidogrel (PLAVIX) 75 MG tablet Take 1 tablet (75 mg total) by mouth daily. 02/15/23   Ivonne Andrew, NP  EPINEPHrine 0.3 mg/0.3 mL IJ SOAJ injection Inject 0.3 mg into the muscle as needed for anaphylaxis. Patient not taking: Reported on 03/09/2023 05/07/21   Verlee Monte, MD  ezetimibe (ZETIA) 10 MG tablet Take 1 tablet (10 mg total) by mouth daily. 02/15/23   Ivonne Andrew, NP  ibuprofen (ADVIL) 800 MG tablet Take 1 tablet (800 mg total) by mouth every 8 (eight) hours as needed. Patient not taking: Reported on 03/09/2023 02/15/23   Ivonne Andrew, NP  LIVALO 4 MG TABS Take 1 tablet by mouth once daily 04/07/23   Chrystie Nose, MD  metFORMIN (GLUCOPHAGE-XR) 500 MG 24 hr tablet Take 1 tablet (500 mg total) by mouth daily with breakfast. 02/15/23 05/16/23  Ivonne Andrew, NP  metoprolol tartrate (LOPRESSOR) 100 MG tablet Take 1 tablet (100 mg total) by mouth 2 (two) times daily. NEED OV. 02/15/23   Ivonne Andrew, NP  mirtazapine (REMERON) 15 MG tablet Take 1 tablet (15 mg total) by mouth at bedtime. 02/19/23 03/21/23  Augusto Gamble, MD  Multiple Vitamin  (MULTIVITAMIN WITH MINERALS) TABS tablet Take 1 tablet by mouth daily. 08/13/22   Park Pope, MD  nitroGLYCERIN (NITROSTAT) 0.4 MG SL tablet Place 1 tablet (0.4 mg total) under the tongue every 5 (five) minutes x 3 doses as needed for chest pain. Patient not taking: Reported on 03/09/2023 07/18/21   Chrystie Nose, MD  Polyethyl Glycol-Propyl Glycol (SYSTANE) 0.4-0.3 % GEL ophthalmic gel Place 1 Application into both eyes daily as needed (For dry eyes). Patient not taking: Reported on 03/09/2023    [provider]  predniSONE (DELTASONE) 50 MG tablet Contrast Allergy protocol:  Take one 50mg  tablet by mouth at 10:00pm 02/27/2023 Take one 50mg  tablet by mouth at 4:00am 02/28/2023 Take one 50mg  tablet by mouth at 10:00am 02/28/2023 along with a 50mg  over the counter Benedryl Patient not taking: Reported on 03/09/2023 02/25/23   Iva Boop, MD  Probiotic Product (RESTORA) CAPS Take 1 capsule by mouth daily. Patient not taking: Reported on 03/09/2023 11/20/22   Ivonne Andrew, NP  sertraline (ZOLOFT) 50 MG tablet Take 1 tablet (50 mg total) by mouth daily. 02/19/23 03/21/23  Augusto Gamble, MD  sildenafil (VIAGRA) 100 MG tablet Take 100 mg by mouth daily as needed for erectile dysfunction. Patient not taking: Reported on 03/09/2023 10/17/22   [provider]  Testosterone 1.62 % GEL PLACE 1 PUMP ONTO THE SKIN DAILY. Patient taking differently: Apply 1 Pump topically daily. 02/01/23   Ivonne Andrew, NP  traZODone (DESYREL) 50 MG tablet Take 1 tablet (50 mg total) by mouth at bedtime as needed for sleep. Patient taking differently: Take 50 mg by mouth at bedtime. 02/15/23   Ivonne Andrew, NP    Family History Family History  Problem Relation Age of Onset   Leukemia Mother    Prostate cancer Father    Cancer Brother    Coronary artery disease Paternal Grandmother    Cancer Paternal Grandfather    Migraines Neg Hx    Headache Neg Hx    Colon cancer Neg Hx    Esophageal cancer Neg Hx     Rectal cancer Neg Hx    Stomach cancer Neg Hx     Social History Social History   Tobacco Use   Smoking status: Former    Current packs/day: 0.00    Average packs/day: 1 pack/day for 10.0 years (10.0 ttl pk-yrs)    Types: Cigarettes    Start date: 10/06/2008    Quit date: 10/07/2018    Years since quitting: 4.6   Smokeless tobacco: Never  Vaping Use   Vaping status: Never Used  Substance Use Topics   Alcohol use: Not Currently    Comment: 05/16/2023-stopped drinking 12 days ago.   Drug use: Not Currently    Types: Cocaine     Allergies   Bee venom, Gadolinium, Shellfish allergy, Statins, and Testosterone cypionate   Review of Systems Review of Systems  Constitutional:  Positive for activity change. Negative for appetite change, fatigue and fever.  Gastrointestinal:  Negative for abdominal pain, diarrhea, nausea and vomiting.  Genitourinary:  Positive for frequency and testicular pain (discomfort). Negative for dysuria, genital sores, penile discharge, penile pain and scrotal swelling.     Physical Exam Triage Vital Signs ED Triage Vitals  Encounter Vitals Group     BP 05/16/23 1330 116/73     Systolic BP Percentile --      Diastolic BP Percentile --      Pulse Rate 05/16/23 1330 (!) 58     Resp 05/16/23 1330 18     Temp 05/16/23 1330 (!) 97.5 F (36.4 C)     Temp Source 05/16/23 1330 Oral     SpO2 05/16/23 1330 97 %     Weight --      Height --      Head Circumference --      Peak Flow --      Pain Score 05/16/23 1327 1     Pain Loc --      Pain Education --  Exclude from Growth Chart --    No data found.  Updated Vital Signs BP 116/73 (BP Location: Right Arm)   Pulse (!) 58   Temp (!) 97.5 F (36.4 C) (Oral)   Resp 18   SpO2 97%   Visual Acuity Right Eye Distance:   Left Eye Distance:   Bilateral Distance:    Right Eye Near:   Left Eye Near:    Bilateral Near:     Physical Exam Vitals reviewed.  Constitutional:      General: He  is awake.     Appearance: Normal appearance. He is well-developed. He is not ill-appearing.     Comments: Very pleasant male appears stated age in no acute distress sitting comfortably in exam room  HENT:     Head: Normocephalic and atraumatic.  Cardiovascular:     Rate and Rhythm: Normal rate and regular rhythm.     Heart sounds: Normal heart sounds, S1 normal and S2 normal. No murmur heard. Pulmonary:     Effort: Pulmonary effort is normal.     Breath sounds: Normal breath sounds. No stridor. No wheezing, rhonchi or rales.     Comments: Clear auscultation bilaterally Abdominal:     General: Bowel sounds are normal.     Palpations: Abdomen is soft.     Tenderness: There is no abdominal tenderness. There is no right CVA tenderness, left CVA tenderness, guarding or rebound.     Hernia: There is no hernia in the left inguinal area or right inguinal area.  Genitourinary:    Penis: Normal and circumcised.      Testes:        Right: Tenderness present. Mass or swelling not present.        Left: Mass, tenderness or swelling not present.     Epididymis:     Right: Not inflamed or enlarged. No tenderness.     Left: Not inflamed or enlarged. No tenderness.     Comments: Mild tenderness to palpation of posterior right testicle without deformity.  No focal tenderness over epididymis.  No inguinal hernia on exam.  Dee, CMA present as chaperone during exam. Neurological:     Mental Status: He is alert.  Psychiatric:        Behavior: Behavior is cooperative.      UC Treatments / Results  Labs (all labs ordered are listed, but only abnormal results are displayed) Labs Reviewed  POCT URINALYSIS DIP (MANUAL ENTRY) - Abnormal; Notable for the following components:      Result Value   Color, UA straw (*)    Clarity, UA cloudy (*)    Bilirubin, UA small (*)    Ketones, POC UA moderate (40) (*)    All other components within normal limits    EKG   Radiology No results  found.  Procedures Procedures (including critical care time)  Medications Ordered in UC Medications - No data to display  Initial Impression / Assessment and Plan / UC Course  I have reviewed the triage vital signs and the nursing notes.  Pertinent labs & imaging results that were available during my care of the patient were reviewed by me and considered in my medical decision making (see chart for details).     Patient is well-appearing, afebrile, nontoxic, nontachycardic.  Vital signs and physical exam are reassuring with no indication for emergent evaluation or imaging.  Given his testicular discomfort, scrotal ultrasound was ordered to investigate his symptoms.  We will contact him if this  is abnormal.  UA was obtained that showed decreased oral intake but no evidence of acute infection.  While we were waiting for his urine to result he reported that he could not wait any longer and so left before we could finish the visit.  I attempted to call him and left a voicemail for return call.  I was eventually able to contact him by phone and discussed his results as well as our treatment plan.  I am more concerned for epididymitis versus proctitis as etiology of symptoms.  Will cover for both with Levaquin.  He was prescribed 500 mg twice daily for 2 weeks.  Recommended he follow-up with urology if symptoms are not improving and he was given contact information for local provider with instruction to call to schedule an appointment in a MyChart measures that was sent.  Discussed that if anything worsens or changes he is to go to the emergency room immediately.  Strict return precautions given.  All questions were answered to patient satisfaction.  Final Clinical Impressions(s) / UC Diagnoses   Final diagnoses:  Testicular discomfort  Urinary frequency   Discharge Instructions   None    ED Prescriptions     Medication Sig Dispense Auth. Provider   levofloxacin (LEVAQUIN) 500 MG tablet Take  1 tablet (500 mg total) by mouth daily. 14 tablet Geovany Trudo, Noberto Retort, PA-C      PDMP not reviewed this encounter.   Jeani Hawking, PA-C 05/16/23 1642

## 2023-05-16 NOTE — ED Notes (Signed)
Staff member reported to this nurse that patient came out of room said he could not wait any longer and left.  This nurse notified Dorann Ou, PA

## 2023-05-16 NOTE — ED Triage Notes (Signed)
Groin pain for a month.  Sometimes, feels he needs to urinate, but unable to do so.   Groin pain is on the right side.   Over the past month, pain and urinary issue has been getting worse.    Has not spoke to provider about this.  Patient reports a recent diagnosis of "renal mass"

## 2023-05-16 NOTE — ED Notes (Signed)
Urine in lab 

## 2023-05-16 NOTE — ED Notes (Signed)
Pt left after triage decided he could know longer wait.

## 2023-05-17 ENCOUNTER — Ambulatory Visit (INDEPENDENT_AMBULATORY_CARE_PROVIDER_SITE_OTHER): Payer: MEDICAID | Admitting: Licensed Clinical Social Worker

## 2023-05-17 ENCOUNTER — Telehealth (HOSPITAL_COMMUNITY): Payer: Self-pay | Admitting: Emergency Medicine

## 2023-05-17 ENCOUNTER — Telehealth: Payer: Self-pay

## 2023-05-17 DIAGNOSIS — N50819 Testicular pain, unspecified: Secondary | ICD-10-CM

## 2023-05-17 DIAGNOSIS — F1420 Cocaine dependence, uncomplicated: Secondary | ICD-10-CM

## 2023-05-17 DIAGNOSIS — F1020 Alcohol dependence, uncomplicated: Secondary | ICD-10-CM

## 2023-05-17 DIAGNOSIS — F17200 Nicotine dependence, unspecified, uncomplicated: Secondary | ICD-10-CM

## 2023-05-17 DIAGNOSIS — F1994 Other psychoactive substance use, unspecified with psychoactive substance-induced mood disorder: Secondary | ICD-10-CM

## 2023-05-17 NOTE — Telephone Encounter (Signed)
Patient left a voicemail stating his order for [place] needed to be changed.  Note seen in chart with instructions, patient states on voicemail to change the order to "Scrotum with Doppler". Forwarding to [place], APP for review
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
## 2023-05-17 NOTE — Telephone Encounter (Signed)
Pt went to [facility] testicular pain right side . They placed an order for U/S of the scrotum but did not do the doppler.  Please place order for U/S with doppler  and  place it for [facility] imaging. Thanks [employer]

## 2023-05-17 NOTE — Telephone Encounter (Signed)
New order for ultrasound scrotum with Doppler placed as requested.

## 2023-05-18 ENCOUNTER — Ambulatory Visit
Admission: RE | Admit: 2023-05-18 | Discharge: 2023-05-18 | Disposition: A | Discharge status: Home / Self Care | Payer: MEDICAID | Source: Ambulatory Visit | Attending: Physician Assistant | Admitting: Physician Assistant

## 2023-05-18 ENCOUNTER — Other Ambulatory Visit: Payer: Self-pay | Admitting: Nurse Practitioner

## 2023-05-18 ENCOUNTER — Ambulatory Visit
Admission: RE | Admit: 2023-05-18 | Discharge: 2023-05-18 | Disposition: A | Discharge status: Home / Self Care | Payer: MEDICAID | Source: Ambulatory Visit | Attending: Nurse Practitioner

## 2023-05-18 ENCOUNTER — Other Ambulatory Visit: Payer: Self-pay

## 2023-05-18 DIAGNOSIS — N50819 Testicular pain, unspecified: Secondary | ICD-10-CM

## 2023-05-18 DIAGNOSIS — N5082 Scrotal pain: Secondary | ICD-10-CM

## 2023-05-19 ENCOUNTER — Encounter (HOSPITAL_COMMUNITY): Payer: Self-pay | Admitting: Medical

## 2023-05-19 ENCOUNTER — Ambulatory Visit (HOSPITAL_COMMUNITY): Payer: MEDICAID | Admitting: Medical

## 2023-05-19 ENCOUNTER — Other Ambulatory Visit (HOSPITAL_COMMUNITY): Payer: Self-pay | Admitting: Nurse Practitioner

## 2023-05-19 VITALS — BP 138/90 | HR 63 | Ht 68.0 in | Wt 196.2 lb

## 2023-05-19 DIAGNOSIS — I10 Essential (primary) hypertension: Secondary | ICD-10-CM

## 2023-05-19 DIAGNOSIS — N5082 Scrotal pain: Secondary | ICD-10-CM

## 2023-05-19 DIAGNOSIS — F1420 Cocaine dependence, uncomplicated: Secondary | ICD-10-CM

## 2023-05-19 DIAGNOSIS — F17200 Nicotine dependence, unspecified, uncomplicated: Secondary | ICD-10-CM | POA: Diagnosis not present

## 2023-05-19 DIAGNOSIS — I252 Old myocardial infarction: Secondary | ICD-10-CM

## 2023-05-19 DIAGNOSIS — N50811 Right testicular pain: Secondary | ICD-10-CM

## 2023-05-19 DIAGNOSIS — F1994 Other psychoactive substance use, unspecified with psychoactive substance-induced mood disorder: Secondary | ICD-10-CM

## 2023-05-19 DIAGNOSIS — Z9861 Coronary angioplasty status: Secondary | ICD-10-CM

## 2023-05-19 DIAGNOSIS — R7401 Elevation of levels of liver transaminase levels: Secondary | ICD-10-CM

## 2023-05-19 DIAGNOSIS — D4121 Neoplasm of uncertain behavior of right ureter: Secondary | ICD-10-CM

## 2023-05-19 DIAGNOSIS — E781 Pure hyperglyceridemia: Secondary | ICD-10-CM

## 2023-05-19 DIAGNOSIS — E349 Endocrine disorder, unspecified: Secondary | ICD-10-CM

## 2023-05-19 DIAGNOSIS — D4101 Neoplasm of uncertain behavior of right kidney: Secondary | ICD-10-CM

## 2023-05-19 DIAGNOSIS — F1020 Alcohol dependence, uncomplicated: Secondary | ICD-10-CM | POA: Diagnosis not present

## 2023-05-19 DIAGNOSIS — I2510 Atherosclerotic heart disease of native coronary artery without angina pectoris: Secondary | ICD-10-CM

## 2023-05-19 DIAGNOSIS — N503 Cyst of epididymis: Secondary | ICD-10-CM

## 2023-05-19 DIAGNOSIS — E1159 Type 2 diabetes mellitus with other circulatory complications: Secondary | ICD-10-CM

## 2023-05-19 NOTE — Progress Notes (Signed)
Daily Group Progress Note   Program: CD IOP     Group Time: 9:00  am-12 pm      Type of Therapy: Process and Psychoeducational    Topic: The therapists check in with group members, assess for SI/HI/psychosis and overall level of functioning. The therapists inquire about sobriety date and number of community support meetings attended since last session.    The therapists introduce 3 new group members today.  The therapists discussed the benefits of attending a 12-step meeting and the benefits of getting a sponsor and solicit feedback from group members who have attended meetings to share what it is they find helpful about attending meetings and and having a sponsor.  When the issue of character defects and working steps is discussed, the therapists provide a handout with a list of character defects and their opposites.  The therapists give two new group members some step 1 worksheets so they can have a better understanding of what working a step entails.  The therapist continue a discussion on the five early challenges people face in early recovery and spend a great deal of time focusing on the topic of guilt and shame and ways to overcome this. They note that guilt and shame puts people at risk of emotional relapse while practicing self compassion and self-care is more conducive to recovery and remaining sober.  The therapists facilitate a discussion on the issue of coming out as an alcoholic or addict or being a person in recovery.  The therapists continue to emphasize the importance of being smart versus strong, and they suggest that thoughts about using cannot be stopped; however, that people in recovery can actively choose to avoid using rituals.  The therapists give examples of what seeking behavior looks like for persons who are actively in addiction.  Summary: [name] presents for his initial group rating his depression and anxiety both as a 0.  He describes his mood as being "happy" and that  he is feeling "acceptance."  [name] says that when he attends meetings that doing so serves as an excellent reminder that he is an alcoholic saying that he has reverted back to being in denial about this when he has stopped meetings in the past.  He admits that he has always had a problem with telling someone that he has an alcoholic; however, he feels more able to do so noting that he now has a sense of "freedom."  He says that he is attending meetings daily; however, he does not have a sponsor as of yet.  Progress Towards Goals: [name] reports no change in the sobriety date.  UDS collected: Yes results: No   AA/NA attended?: Yes   Sponsor?:  No   Alene Mires, MA, [place], [facility], LCAS Remigio Eisenmenger, MS, LMFT, LCAS 05/19/2023

## 2023-05-19 NOTE — Progress Notes (Signed)
Psychiatric Initial Adult Assessment   Patient Identification: [name] MRN:  [id number] Date of Evaluation:  05/19/2023 Referral Source: TRANSITIONS OF CARE Chief Complaint:   Chief Complaint  Patient presents with   Establish Care   Alcohol Problem   Cocaine use disorder   Anxiety   Depression   Coronary Artery Disease   DM2   S/P MI   Visit Diagnosis:    ICD-10-CM   1. Alcohol use disorder, severe, dependence ([name])  F10.20     2. Cocaine use disorder, severe, dependence ([name])  F14.20     3. Substance induced mood disorder ([name])  F19.94     4. Tobacco use disorder  F17.200    Only when using    5. CAD S/P percutaneous coronary angioplasty  I25.10    Z98.61     6. History of myocardial infarction due to atherothrombotic coronary artery disease  I25.2    I25.10     7. Hypertriglyceridemia  E78.1     8. Type 2 diabetes mellitus with other circulatory complication, without long-term current use of insulin ([name])  E11.59     9. Essential hypertension  I10     10. Uncertain tumor of kidney and ureter, right  D41.01    D41.21    MPRESSION: Complex mass ant mid rt kidney 1.3 x 1.0 cm with mixed T1 and T2 signal and enhancing internal septations. consistent with a Bosniak IIF tumor    11. Elevated liver transaminase level  R74.01    ALT 0 - 44 U/L 83 High  02/18/2023      History of Present Illness:  62 Y/O BM REFERRED AFTER RECENT HOSPITALIZATION AT [facility] 11/4-11/01/2023 for Severe alcohol use disorder.Pt was originally referred to [facility] but has [employer] so was referred to the [facility]: His understanding was his diabetic diet could not be accommodated. [name]  Social Worker CASE MANAGEMENT  Telephone Encounter Signed  Encounter Date: 05/13/2023 The therapist receives a call from [place] confirming his identity via two identifiers. He says that he is 14 days sober having recently been discharged from a detox program and is interested  in attending the substance abuse intensive outpatient program.  [name] asks about transportation to and from the program and is given the toll-free number for [facility] to inquire about medication transportation to [facility].  He is scheduled to come in for an assessment on Monday, November 18, at 9 AM. Pt was assessed and recommended for SAIOP (Substance Abuse Intensive Outpatient Program) Today he acknowledges he knows nothing about the addicted brain. He says he began using alcohol at age 65 and that this triggers his use of cocaine and nicotene.  In speaking with him today he admits he knows "nothing" ab eout the addicted brain despite multiple detoxes and hospitalizations.This recent binge wa triggered by findings of Rt kidney mass of undetermined pathology (see MRI report).   Associated Signs/Symptoms:  ASAM's:  Six Dimensions of Multidimensional Assessment   Dimension 1:  Acute Intoxication and/or Withdrawal Potential:   Dimension 1:  Description of individual's past and current experiences of substance use and withdrawal: no withdrawal currently  Dimension 2:  Biomedical Conditions and Complications:   Dimension 2:  Description of patient's biomedical conditions and  complications: unstable angina; had a heart attack in 2008; he has HTN and diabetes  Dimension 3:  Emotional, Behavioral, or Cognitive Conditions and Complications:  Dimension 3:  Description of emotional, behavioral, or cognitive conditions and complications: moderate  anxiety and mild depression  Dimension 4:  Readiness to Change:  Dimension 4:  Description of Readiness to Change criteria: rates desire to quit as a 10 out of 10; he has concerns about his health  Dimension 5:  Relapse, Continued use, or Continued Problem Potential:  Dimension 5:  Relapse, continued use, or continued problem potential critiera description: has not been able to substain recovery  Dimension 6:  Recovery/Living Environment:  Dimension 6:  Recovery/Iiving  environment criteria description: lives by himself with no triggers in his environment  ASAM Severity Score: ASAM's Severity Rating Score: 9  ASAM Recommended Level of Treatment: ASAM Recommended Level of Treatment: Level II Intensive Outpatient Treatment    Substance use Disorder (SUD) Substance Use Disorder (SUD)  Checklist Symptoms of Substance Use: Continued use despite having a persistent/recurrent physical/psychological problem caused/exacerbated by use, Continued use despite persistent or recurrent social, interpersonal problems, caused or exacerbated by use, Evidence of tolerance, Persistent desire or unsuccessful efforts to cut down or control use, Recurrent use that results in a failure to fulfill major role obligations (work, school, home), Presence of craving or strong urge to use, Social, occupational, recreational activities given up or reduced due to use, Substance(s) often taken in larger amounts or over longer times than was intended, Evidence of withdrawal (Comment), Large amounts of time spent to obtain, use or recover from the substance(s) Depression Symptoms:  anhedonia, difficulty concentrating, loss of energy/fatigue, increased appetite, PHQ 9 score 9 (Hypo) Manic Symptoms:   Denies Anxiety Symptoms:  Excessive Worry, GAD 7 Score 12  Anxiety Difficulty Very Difficult Psychotic Symptoms:   NA PTSD Symptoms: Pt denies trauma history  but childhood history say otherwise: Description of patient's relationship with caregiver when they were a child: Good relationship but dysfunctional with alcoholism and depression  childhood: "depressing and dysfunctional" Very little communication   Past Psychiatric History:  2019-21 7 [facility] Admissions/ 1 Suicide attempt 2023-present 5 [facility] Admissions 2024 [facility] Admissione  He had prior suicide attempt by driving off the road in the 90s and by overdosing in 2005.   He stated he had been in detox multiple times past   Previous  Psychotropic Medications: Yes   Substance Abuse History in the last 12 months:  Yes.     [facility] Substance Use Alcohol/Drug Use: Alcohol / Drug Use Pain Medications: None Prescriptions: Ezetimibe 10 mg , Livalo 4 mg, Amlodipine 10 mg, Nitrostat 0.4 mg, Metoprolol tartrate 1 00mg , and Meformin 500 mg Over the Counter: aspirin 81 mg, Vitamin D3 25 mcg, and a multivitamin History of alcohol / drug use?: Yes Longest period of sobriety (when/how long): 4 years and 1 month; this was from 2011 through 2015; he was involved in a good recovery program but got "bored with recovery" as "things were going so well" Negative Consequences of Use: Financial, Personal relationships, Legal (has had five DWIs with the last being in 2005) Withdrawal Symptoms: Tremors, Blackouts, Irritability Substance #1 Name of Substance 1: Alcohol 1 - Age of First Use: 23 1 - Amount (size/oz): at least a liter of gin 1 - Frequency: in the beginning, he used to drink every day; however, after he relapsed after 4 years; he became a binger in which he would drink all day and have to rest for three days 1 - Duration: 8-9 years for current pattern 1 - Last Use / Amount: 05/04/2023 1 - Method of Aquiring: legal 1- Route of Use: oral Substance #2 Name of Substance 2: Cocaine 2 - Age  of First Use: 22 2 - Amount (size/oz): $20 worth 2 - Frequency: uses about once a month 2 - Duration: 40 years 2 - Last Use / Amount: 05/03/2023 2 - Method of Aquiring: illicit 2 - Route of Substance Use: snorting Substance #3 Name of Substance 3: Tobacco 3 - Age of First Use: 22 3 - Amount (size/oz): pack of cigarettes 3 - Frequency: only smokes when drinking alcohol 3 - Duration: approximately 10 times per month 3 - Last Use / Amount: 05/03/2023 3 - Method of Aquiring: legal 3 - Route of Substance Use: smoking    Consequences of Substance Abuse: Medical Consequences:  Multiple ED visits/Aggravation of Blood sugars/ CAD Elevated  LFTs Legal Consequences:  None noted Family Consequences:  Dysfunctional relationships Blackouts:  Yes DT's: No Withdrawal Symptoms:    poor sleep, hopelessness/helplessness, low energy/fatigue, concentration problems, feelings of guilt/worthlessness, social withdrawal, sweaty/chills/hot flashes, excessive worry, nervousness, and nausea/vomiting/diarrhea. P  Past Medical History:  Care Everywhere [facility] 05/03/2023 10:01 PM EST  - 05/07/2023 3:15 PM EST Hospital Encounter [facility]  [address] [place], [place] [address]  [phone]  I  Severe alcohol use disorder (*) (Primary Dx); Controlled type 2 diabetes mellitus without complication, without long-term current use of insulin (*) [E11.9] Discharge Disposition: Home or Self Care    Past Surgical History:  Procedure Laterality Date   CARDIAC CATHETERIZATION  06/15/2002   LAD with prox 40% stenosis, norma L main, Cfx with 25% lesion, RCA with long mid 25% stenosis (Dr. Daiva Nakayama)   CARDIAC CATHETERIZATION  04/01/2010   normal L main, LAD wit mild stenosis, L Cfx with 70% in-stent restenosis, RCA with 70% in-stent restenosis, LVEF >60% (Dr. K. Italy Hilty) - cutting ballon arthrectomy to RCA & Cfx (Dr. Langston Reusing)   CARDIAC CATHETERIZATION  08/25/2010   preserved global LV contractility; multivessel CAD, diffuse 90-95% in-stent restenosis in prox placed Cfx stent - cutting balloon arthrectomy in Cfx with multiple dilatations 90-95% to 0% stenosis (Dr. Bishop Limbo)   CARDIAC CATHETERIZATION  01/26/2011   PCI & stenting of aggresive in-stent restenosis within previously stented AV groove Cfx with 3.0x57mm Taxus DES (previous stents were Promus) (Dr. Erlene Quan)   CARDIAC CATHETERIZATION  05/11/2011   preserved LV function, 40% mid LAD stenosis, 30-40% narrowing proximal to stented semgnet of prox Cfx, patent mid RCA stent with smooth 20% narrowing in distal RCA (Dr. Bishop Limbo)   CARDIAC CATHETERIZATION   12/15/2011   PCI & stenting of proximal & mid Cfx with DES - 3.0x53mm in proximal, 3.0x74mm in mid (Dr. Erlene Quan)   CARDIAC CATHETERIZATION N/A 06/10/2015   Procedure: Left Heart Cath and Coronary Angiography;  Surgeon: Dolores Patty, MD;  [facility] INVASIVE CV LAB;  Service: Cardiovascular;  Laterality: N/A;   CARDIAC CATHETERIZATION  10/25/2018   cardiometabolic testing  08/08/2012   good exercise effort, peak VO2 79% predicted with normal VO2 HR curves (mild deconditioning)   COLONOSCOPY  12/2012   diminutive hyperplastic sigmoid poyp so repeat routine 2024   CORONARY BALLOON ANGIOPLASTY N/A 10/25/2018   Procedure: CORONARY BALLOON ANGIOPLASTY;  Surgeon: Corky Crafts, MD;  Location: [facility] INVASIVE CV LAB;  Service: Cardiovascular;  Laterality: N/A;   CORONARY BALLOON ANGIOPLASTY N/A 09/29/2019   Procedure: CORONARY BALLOON ANGIOPLASTY;  Surgeon: Yvonne Kendall, MD;  Location: [facility] INVASIVE CV LAB;  Service: Cardiovascular;  Laterality: N/A;   CORONARY ULTRASOUND/IVUS N/A 09/29/2019   Procedure: Intravascular Ultrasound/IVUS;  Surgeon: Yvonne Kendall,  MD;  Location: [facility] INVASIVE CV LAB;  Service: Cardiovascular;  Laterality: N/A;   EXCISIONAL HEMORRHOIDECTOMY  1984   LEFT HEART CATH AND CORONARY ANGIOGRAPHY N/A 10/25/2018   Procedure: LEFT HEART CATH AND CORONARY ANGIOGRAPHY;  Surgeon: Corky Crafts, MD;  Location[facility] INVASIVE CV LAB;  Service: Cardiovascular;  Laterality: N/A;   LEFT HEART CATH AND CORONARY ANGIOGRAPHY N/A 09/29/2019   Procedure: LEFT HEART CATH AND CORONARY ANGIOGRAPHY;  Surgeon: Yvonne Kendall, MD;  Location: [facility] INVASIVE CV LAB;  Service: Cardiovascular;  Laterality: N/A;   LEFT HEART CATHETERIZATION WITH CORONARY ANGIOGRAM N/A 05/11/2011   Procedure: LEFT HEART CATHETERIZATION WITH CORONARY ANGIOGRAM;  Surgeon: Lennette Bihari, MD;  Location: [facility] CATH LAB;  Service: Cardiovascular;  Laterality: N/A;  Possible percutaneous coronary intervention, possible IVUS    LEFT HEART CATHETERIZATION WITH CORONARY ANGIOGRAM N/A 12/15/2011   Procedure: LEFT HEART CATHETERIZATION WITH CORONARY ANGIOGRAM;  Surgeon: Runell Gess, MD;  Location: [facility] CATH LAB;  Service: Cardiovascular;  Laterality: N/A;   LEFT HEART CATHETERIZATION WITH CORONARY ANGIOGRAM N/A 09/05/2014   Procedure: LEFT HEART CATHETERIZATION WITH CORONARY ANGIOGRAM;  Surgeon: Kathleene Hazel, MD;  Location: [facility] CATH LAB;  Service: Cardiovascular;  Laterality: N/A;   LIPOMA EXCISION     back of the head   NM MYOCAR PERF WALL MOTION  02/2012   lexiscan myoview; mild perfusion defect in mid inferolateral & basal inferolateral region (infarct/scar); EF 52%, abnormal but ow risk scan   PERCUTANEOUS CORONARY STENT INTERVENTION (PCI-S)  09/05/2014   Procedure: PERCUTANEOUS CORONARY STENT INTERVENTION (PCI-S);  Surgeon: Kathleene Hazel, MD;  Location: [facility] CATH LAB;  Service: Cardiovascular;;   TONSILLECTOMY      Family Psychiatric History:  Family psychiatric history: Reported history of depression in his mother and brother.  Alcohol use - oldest brother oldest sister, youngest brother.  Reported history of anxiety in sister and mother.  Aunts and uncles and cousins on both sides of the family with addiction issues.  Uncle and great grandmother committed suicide uncle   Family History:  Family History  Problem Relation Age of Onset   Leukemia Mother    Prostate cancer Father    Cancer Brother    Coronary artery disease Paternal Grandmother    Cancer Paternal Grandfather    Migraines Neg Hx    Headache Neg Hx    Colon cancer Neg Hx    Esophageal cancer Neg Hx    Rectal cancer Neg Hx    Stomach cancer Neg Hx     Social History:   Social History   Socioeconomic History   Marital status: Widowed    Spouse name: Not on file   Number of children: 2   How is patient's relationship with their children?: No concerns outside of not getting to see them enough anymore. Both children live out  of state.    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   Drug use: Not Currently    Types: Cocaine   Sexual activity: Not Currently  Other Topics Concern   Religion/Spirituality Are You A Religious Person?: Yes What is Your Religious Affiliation?: [place]  Social History Narrative   ** Merged History Encounter **    Pt states he only smoke when he drinks    Stressors IllnessStressors. Illness. The comment is Addiction. Taken on 05/17/23 0921     Leisure and [employer], ride [employer], groom animals     Social Determinants of Health   Financial Resource Strain: Medium Risk (05/19/2023)   Overall Financial Resource Strain (CARDIA)    Difficulty of Paying Living Expenses: Somewhat hard  Food Insecurity: Food Insecurity Present (05/19/2023)   Hunger Vital Sign    Worried About Running Out of Food in the Last Year: Often true    Ran Out of Food in the Last Year: Sometimes true  Transportation Needs: No Transportation Needs (05/19/2023)   PRAPARE - Administrator, Civil Service (Medical): No    Lack of Transportation (Non-Medical): No  Recent Concern: Transportation Needs - Unmet Transportation Needs (05/07/2023)   Received from [facility] Transportation    Lack of Transportation (Medical): Yes    Lack of Transportation (Non-Medical): No   Physical Activity: Insufficiently Active (05/19/2023)   Exercise Vital Sign    Days of Exercise per Week: 5 days    Minutes of Exercise per Session: 20 min  Stress: No Stress Concern Present (05/19/2023)   [employer] of Occupational Health - Occupational Stress Questionnaire    Feeling of Stress : Only a little  Recent Concern: Stress - Stress Concern Present (05/03/2023)   Received from [facility] of Occupational Health - Occupational Stress Questionnaire    Feeling of Stress : Very much  Social Connections: Moderately Isolated (05/19/2023)   Social Connection and Isolation Panel [NHANES]    Frequency of Communication with Friends and Family: Once a week    Frequency of Social Gatherings with Friends and Family: Once a week    Attends Religious Services: More than 4 times per year    Active Member of [employer] or Organizations: Yes    Attends Banker Meetings: More than 4 times per year    Marital Status: Widowed   Additional Social History:  Number of Siblings: 6 Description of patient's current relationship with siblings: close to two of his siblings; his little brother is still in active addiction he thinks  Allergies:   Allergies  Allergen Reactions   Bee Venom Anaphylaxis, Hives, Itching and Other (See Comments)    Red eyes   Gadolinium Hives, Itching, Swelling and Other (See Comments)    Swelling of eyes after receiving MR contrast. 13 hr prep recommended. Pt developed large hive and swelling under right eye after contrast, given 50mg  iv benadryl, pt will need premed before gadolinium//lh   Shellfish Allergy Anaphylaxis, Hives, Itching and Other (See Comments)    Red eyes   Statins Other (See Comments)    Myalgias. Tolerating livalo. Pain    Testosterone Cypionate Other (See Comments)    Testerone Injection --Increased breast tissue     Metabolic Disorder Labs: Lab Results  Component Value Date   HGBA1C 6.8 (A) 05/20/2023    MPG 159.94 02/15/2023   MPG 142.72 08/07/2022  Lab Results  Component Value Date   PROLACTIN 13.0 03/17/2020   Lab Results  Component Value Date   CHOL 154 02/15/2023   TRIG 219 (H) 02/15/2023   HDL 43 02/15/2023   CHOLHDL 3.6 02/15/2023   VLDL 44 (H) 02/15/2023   LDLCALC 67 02/15/2023   LDLCALC 56 11/18/2022   Lab Results  Component Value Date   TSH 2.497 02/15/2023    Latest Reference Range & Units 05/16/22 13:33  Alcohol, Ethyl (B) <10 mg/dL 36 (H)  (H): Data is abnormally high    Current Medications: Current Outpatient Medications  Medication Sig Dispense Refill   ACCU-CHEK GUIDE test strip USE 1 STRIP TO CHECK GLUCOSE THREE TIMES DAILY AS DIRECTED 300 each 0   amLODipine (NORVASC) 10 MG tablet Take 1 tablet (10 mg total) by mouth daily. 90 tablet 0   Cholecalciferol 25 MCG (1000 UT) tablet Take 1,000 Units by mouth daily.     clopidogrel (PLAVIX) 75 MG tablet Take 1 tablet (75 mg total) by mouth daily. 30 tablet 2   EPINEPHrine 0.3 mg/0.3 mL IJ SOAJ injection Inject 0.3 mg into the muscle as needed for anaphylaxis. (Patient not taking: Reported on 03/09/2023) 1 each 2   ezetimibe (ZETIA) 10 MG tablet Take 1 tablet (10 mg total) by mouth daily. 90 tablet 0   levofloxacin (LEVAQUIN) 500 MG tablet Take 1 tablet (500 mg total) by mouth daily. 14 tablet 0   LIVALO 4 MG TABS Take 1 tablet by mouth once daily 90 tablet 3   metFORMIN (GLUCOPHAGE-XR) 500 MG 24 hr tablet Take 1 tablet (500 mg total) by mouth daily with breakfast. 30 tablet 2   metoprolol tartrate (LOPRESSOR) 100 MG tablet Take 1 tablet (100 mg total) by mouth 2 (two) times daily. NEED OV. 180 tablet 0   Multiple Vitamin (MULTIVITAMIN WITH MINERALS) TABS tablet Take 1 tablet by mouth daily.     nitroGLYCERIN (NITROSTAT) 0.4 MG SL tablet Place 1 tablet (0.4 mg total) under the tongue every 5 (five) minutes x 3 doses as needed for chest pain. (Patient not taking: Reported on 03/09/2023) 25 tablet 3   Polyethyl  Glycol-Propyl Glycol (SYSTANE) 0.4-0.3 % GEL ophthalmic gel Place 1 Application into both eyes daily as needed (For dry eyes). (Patient not taking: Reported on 03/09/2023)     sildenafil (VIAGRA) 100 MG tablet Take 100 mg by mouth daily as needed for erectile dysfunction. (Patient not taking: Reported on 03/09/2023)     No current facility-administered medications for this visit.    Musculoskeletal: Strength & Muscle Tone: within normal limits Gait & Station: normal Patient leans: N/A  Psychiatric Specialty Exam: Review of Systems  Constitutional:  Positive for activity change (16 dys sober) and unexpected weight change (Have You Gained or Lost A Significant Amount of Weight in the Past Six Months?: Yes-Gained Number of Pounds Gained: 15). Negative for appetite change, chills, diaphoresis, fatigue and fever.  HENT:  Negative for congestion, dental problem, postnasal drip, rhinorrhea, sinus pressure, sinus pain, sneezing, sore throat, tinnitus, trouble swallowing and voice change.   Respiratory:  Negative for apnea, cough, choking, chest tightness, shortness of breath, wheezing and stridor.   Cardiovascular:  Positive for chest pain (Angina). Negative for palpitations and leg swelling.       CAD  Gastrointestinal:  Negative for abdominal distention.  Endocrine: Negative for cold intolerance, heat intolerance, polydipsia, polyphagia and polyuria.  Genitourinary:  Negative for decreased urine volume, difficulty urinating, dysuria, enuresis, flank pain, frequency, genital sores,  hematuria, penile discharge, penile pain, penile swelling, scrotal swelling, testicular pain and urgency.       ED Rt Kidney mass Complex lesion in the anterior midportion of the right kidney measuring 1.3 x 1.0 cm with mixed intrinsically T1 hyperintense and T2 hyperintense fluid signal as well as perceptibly enhancing internal septations. This is consistent with a Bosniak category IIF lesion. The large majority of  Bosniak IIF lesions are benign. When malignant, nearly all are indolent. Generally, Bosniak IIF lesions are followed by imaging at 6 months and 12 months (MRI preferred over CT), then annually for a total of 5 years to assess for morphologic change.   Musculoskeletal:  Negative for arthralgias, back pain, gait problem, joint swelling, myalgias, neck pain and neck stiffness.  Skin:  Negative for color change, pallor, rash and wound.  Allergic/Immunologic: Positive for food allergies (shellfish). Negative for environmental allergies and immunocompromised state.  Neurological:  Negative for dizziness, tremors, seizures, syncope, facial asymmetry, speech difficulty, weakness, light-headedness, numbness and headaches.  Psychiatric/Behavioral:  Positive for dysphoric mood. Negative for agitation, behavioral problems, confusion, decreased concentration, hallucinations, self-injury, sleep disturbance and suicidal ideas. The patient is nervous/anxious. The patient is not hyperactive.   All other systems reviewed and are negative.   Blood pressure (!) 138/90, pulse 63, height 5\' 8"  (1.727 m), weight 196 lb 3.2 oz (89 kg), SpO2 98%.Body mass index is 29.83 kg/m.  General Appearance: Well Groomed  Eye Contact:  Good  Speech:  Clear and Coherent and Normal Rate  Volume:  Normal  Mood:  Anxious  Affect:  Congruent  Thought Process:  Coherent, Goal Directed, and Descriptions of Associations: Intact  Orientation:  Full (Time, Place, and Person)  Thought Content:  WDL  Suicidal Thoughts:  No  Homicidal Thoughts:  No  Memory:   Childhood trauma  Judgement:  Impaired  Insight:  Lacking  Psychomotor Activity:  Negative  Concentration:  Concentration: Good and Attention Span: Good  Recall:  Good  Fund of Knowledge: WDL  Language: Good  Akathisia:  NA  Handed:  Right  AIMS (if indicated):  NA  Assets:  Desire for Improvement Financial Resources/Insurance Housing Resilience Vocational/Educational   ADL's:  Impaired  Cognition: Impaired,  Mild and Moderate  Sleep:  Negative   Screenings: AIMS    Flowsheet Row [facility] (Discharged[facility] (Discharged) from [facility] Ad[facility] (Discharged) from [facility] Ad[facility] (Discharged) from [facility] ([facility]) [facility]  AIMS Total Score 0 0 0 0 0      AUDIT    Flowsheet Row Office Visit from 11/16/2022 in [facility] [facility] - A Dept Of Eligha Bridegroom [facility] Office Visit from 08/27/2020 in [facility] Admission (Discharged) from OP [facility] Ad[facility] (Discharged) from OP [facility]  Alcohol Use Disorder Identification Test Final Score (AUDIT) 24 27 28 13 25       GAD-7    Flowsheet Row Counselor from 05/17/2023 in [facility] Office Visit from 02/15/2023 in [facility] - A Dept Of Patrcia Dolly  [facility] Clinical Support from 02/24/2022 in [facility] Office Visit from 01/08/2022 in [facility] - A Dept Of Eligha Bridegroom [facility] Video Visit from 11/19/2021 in [facility]  Total GAD-7 Score 12 10 2 12 13       PHQ2-9    Flowsheet Row Counselor from 05/17/2023 in [facility] Most recent reading at 05/17/2023  9:12 AM ED from 02/15/2023 in [facility] Most recent reading at  02/19/2023  9:13 AM ED from 02/15/2023 in [facility] Most recent reading at 02/15/2023  9:08 PM Office Visit from 02/15/2023 in [facility] - A Dept Of [facility] Most recent reading at 02/15/2023  8:21 AM Office Visit from 11/16/2022 in [facility] - A Dept Of [facility] Most recent reading at 11/16/2022  3:58 PM  PHQ-2 Total Score 3 0 5 6 6   PHQ-9 Total Score 9 -- 17 17 11       Flowsheet Row ED from 05/16/2023 in [facility] Most recent reading at 05/16/2023  1:30 PM ED from 02/15/2023 in [facility] Most recent reading at 02/16/2023 12:52 PM ED from 02/15/2023 in [facility] Most recent reading at 02/15/2023  7:32 PM  C-SSRS RISK CATEGORY Error: Question 6 not populated No Risk Low Risk       Assessment  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 in family rife with alcoholism and mental health disorders) Cardiac disease DM TYpe2  Bosniak IIF tumor  Diverticulitis ED   and Plan:  Treatment Plan/Recommendations:  Plan of Care: SUDs/Core issues [facility] CDIOP see Counselor's individualized treatment plan  Laboratory:  UDS per protocol  Psychotherapy: CD IOP Group,Individual and Family  Medications: See list  Routine PRN Medications:  NA  Consultations: NA  Safety Concerns: RISK ASSESSMENT -Negative  Other:  Anatomy and Biology of addiction reviewed with Google (Pictures of Pet Scans Of Addicted Brains) and You Tube (Baclofen reduces cravings)   Collaboration of Care: Primary Care Provider AEB  , Psychiatrist AEB  , Other provider involved in patient's care AEB  , and Community Stakeholder(s) AEB    Patient/Guardian was advised Release of Information must be obtained prior to any record release in order to collaborate their care with an  outside provider. Patient/Guardian was advised if they have not already done so to contact the registration department to sign all necessary forms in order for [place] to release information regarding their care.   Consent: Patient/Guardian gives verbal consent for treatment and assignment of benefits for services provided during this visit. Patient/Guardian expressed understanding and agreed to proceed.   Maryjean Morn, PA-C 10:37 AM 05/19/2023

## 2023-05-20 ENCOUNTER — Encounter: Payer: Self-pay | Admitting: Nurse Practitioner

## 2023-05-20 ENCOUNTER — Ambulatory Visit (INDEPENDENT_AMBULATORY_CARE_PROVIDER_SITE_OTHER): Payer: MEDICAID | Admitting: Nurse Practitioner

## 2023-05-20 VITALS — BP 129/89 | HR 63 | Temp 97.0°F | Ht 68.0 in | Wt 195.0 lb

## 2023-05-20 DIAGNOSIS — E1159 Type 2 diabetes mellitus with other circulatory complications: Secondary | ICD-10-CM

## 2023-05-20 DIAGNOSIS — E785 Hyperlipidemia, unspecified: Secondary | ICD-10-CM

## 2023-05-20 DIAGNOSIS — Z23 Encounter for immunization: Secondary | ICD-10-CM

## 2023-05-20 LAB — POCT GLYCOSYLATED HEMOGLOBIN (HGB A1C): Hemoglobin A1C: 6.8 % — AB (ref 4.0–5.6)

## 2023-05-20 MED ORDER — EZETIMIBE 10 MG PO TABS: 10.0000 mg | ORAL_TABLET | Freq: Every day | ORAL | 0 refills | Status: DC

## 2023-05-20 MED ORDER — AMLODIPINE BESYLATE 10 MG PO TABS: 10.0000 mg | ORAL_TABLET | Freq: Every day | ORAL | 0 refills | Status: DC

## 2023-05-20 MED ORDER — METFORMIN HCL ER 500 MG PO TB24: 500.0000 mg | ORAL_TABLET | Freq: Every day | ORAL | 2 refills | Status: DC

## 2023-05-20 MED ORDER — METOPROLOL TARTRATE 100 MG PO TABS: 100.0000 mg | ORAL_TABLET | Freq: Two times a day (BID) | ORAL | 0 refills | Status: DC

## 2023-05-20 NOTE — Patient Instructions (Addendum)
1. Type 2 diabetes mellitus with other circulatory complication, without long-term current use of insulin ([name])  - POCT glycosylated hemoglobin (Hb A1C) - metFORMIN (GLUCOPHAGE-XR) 500 MG 24 hr tablet; Take 1 tablet (500 mg total) by mouth daily with breakfast.  Dispense: 30 tablet; Refill: 2   2. Hyperlipidemia, unspecified hyperlipidemia type  - ezetimibe (ZETIA) 10 MG tablet; Take 1 tablet (10 mg total) by mouth daily.  Dispense: 90 tablet; Refill: 0  Follow up:  Follow up in 3 months

## 2023-05-20 NOTE — Progress Notes (Signed)
Subjective   Patient ID: [name], male    DOB: 05/07/1961, 62 y.o.   MRN: [id number]  Chief Complaint  Patient presents with   Diabetes    Follow up    Referring provider: Ivonne Andrew, NP  [name] is a 62 y.o. male with Past Medical History: No date: Alcohol dependence ([name]) No date: Allergies No date: Arthritis No date: Chest pain No date: Chronic lower back pain No date: Chronic pain of right wrist No date: Cocaine abuse ([name])     Comment:  per note on 01/18/23 No date: Coronary artery disease     Comment:  a. Multiple prior caths/PCI. Cath 2013 with possible               spasm of RCA, 70% ISR of mid LCx with subsequent DES to               mLCx and prox LCX. b. H/o microvascular angina. c.               Recurrent angina 08/2014 - s/p PTCA/DES to prox Cx,               PTCA/CBA to OM1.  c. LHC 06/10/15 with patent stents and               some ISR in LCX and OM-1 that was not flow limiting -->               Rx  No date: Dyslipidemia     Comment:  a. Intolerant to many statins except tolerating Livalo. No date: GERD (gastroesophageal reflux disease) 10/25/2018: H/O cardiac catheterization No date: Heart attack [facility]) No date: Hypertension ~ 2010: Myocardial infarction ([name]) 12/15/2011: S/P angioplasty with stent, DES, to proximal and mid LCX  12/15/11 04/2021: S/P foot surgery, right No date: Shoulder pain No date: Stroke [facility])     Comment:  pt. reports had a stroke around time of MI 2010 No date: Type II diabetes mellitus ([name]) No date: Unstable angina ([name])   HPI  Diabetes:   [name] is in today for diabetes follow up. The prescribed treatment is metformin 500 mg  along with therapy zetia. The reported use of treatment is consistent with prescribed. He denies reported side effects from the treatment. Home glucose monitoring indicates a CBG normal range. His goal is to consisently eating healthy.  He states that he has not been doing well with  diet or exercise. There has been a professional eye exam in the year.     He reports that he has note taken his medication. He continues to take metoprolol 100 mg BID. He does not monitor BP or HR at home. Denies f/c/s, n/v/d, hemoptysis, PND, leg swelling Denies chest pain or edema     Note: Cardiology - refilling htn medications    Allergies  Allergen Reactions   Bee Venom Anaphylaxis, Hives, Itching and Other (See Comments)    Red eyes   Gadolinium Hives, Itching, Swelling and Other (See Comments)    Swelling of eyes after receiving MR contrast. 13 hr prep recommended. Pt developed large hive and swelling under right eye after contrast, given 50mg  iv benadryl, pt will need premed before gadolinium//lh   Shellfish Allergy Anaphylaxis, Hives, Itching and Other (See Comments)    Red eyes   Statins Other (See Comments)    Myalgias. Tolerating livalo. Pain    Testosterone Cypionate Other (See Comments)    Testerone Injection --  Increased breast tissue     Immunization History  Administered Date(s) Administered   Influenza,inj,Quad PF,6+ Mos 07/28/2017, 04/01/2018, 03/05/2021   MODERNA COVID-19 SARS-COV-2 PEDS BIVALENT BOOSTER 5yr-21yr 12/09/2019, 01/10/2020   Moderna SARS-COV2 Booster Vaccination 08/13/2020   Pneumococcal Polysaccharide-23 07/28/2017, 03/05/2021   Pneumococcal-Unspecified 06/29/2009   Tdap 02/08/2020   Zoster Recombinant(Shingrix) 01/11/2013    Tobacco History: Social History   Tobacco Use  Smoking Status Former   Current packs/day: 0.00   Average packs/day: 1 pack/day for 10.0 years (10.0 ttl pk-yrs)   Types: Cigarettes   Start date: 10/06/2008   Quit date: 10/07/2018   Years since quitting: 4.6  Smokeless Tobacco Never   Counseling given: Not Answered   Outpatient Encounter Medications as of 05/20/2023  Medication Sig   ACCU-CHEK GUIDE test strip USE 1 STRIP TO CHECK GLUCOSE THREE TIMES DAILY AS DIRECTED   Cholecalciferol 25 MCG (1000 UT) tablet  Take 1,000 Units by mouth daily.   clopidogrel (PLAVIX) 75 MG tablet Take 1 tablet (75 mg total) by mouth daily.   levofloxacin (LEVAQUIN) 500 MG tablet Take 1 tablet (500 mg total) by mouth daily.   LIVALO 4 MG TABS Take 1 tablet by mouth once daily   Multiple Vitamin (MULTIVITAMIN WITH MINERALS) TABS tablet Take 1 tablet by mouth daily.   [DISCONTINUED] amLODipine (NORVASC) 10 MG tablet Take 1 tablet (10 mg total) by mouth daily.   [DISCONTINUED] ezetimibe (ZETIA) 10 MG tablet Take 1 tablet (10 mg total) by mouth daily.   [DISCONTINUED] metFORMIN (GLUCOPHAGE-XR) 500 MG 24 hr tablet Take 1 tablet (500 mg total) by mouth daily with breakfast.   amLODipine (NORVASC) 10 MG tablet Take 1 tablet (10 mg total) by mouth daily.   EPINEPHrine 0.3 mg/0.3 mL IJ SOAJ injection Inject 0.3 mg into the muscle as needed for anaphylaxis. (Patient not taking: Reported on 03/09/2023)   ezetimibe (ZETIA) 10 MG tablet Take 1 tablet (10 mg total) by mouth daily.   metFORMIN (GLUCOPHAGE-XR) 500 MG 24 hr tablet Take 1 tablet (500 mg total) by mouth daily with breakfast.   metoprolol tartrate (LOPRESSOR) 100 MG tablet Take 1 tablet (100 mg total) by mouth 2 (two) times daily. NEED OV.   nitroGLYCERIN (NITROSTAT) 0.4 MG SL tablet Place 1 tablet (0.4 mg total) under the tongue every 5 (five) minutes x 3 doses as needed for chest pain. (Patient not taking: Reported on 03/09/2023)   Polyethyl Glycol-Propyl Glycol (SYSTANE) 0.4-0.3 % GEL ophthalmic gel Place 1 Application into both eyes daily as needed (For dry eyes). (Patient not taking: Reported on 03/09/2023)   sildenafil (VIAGRA) 100 MG tablet Take 100 mg by mouth daily as needed for erectile dysfunction. (Patient not taking: Reported on 03/09/2023)   [DISCONTINUED] ibuprofen (ADVIL) 800 MG tablet Take 1 tablet (800 mg total) by mouth every 8 (eight) hours as needed. (Patient not taking: Reported on 03/09/2023)   [DISCONTINUED] metoprolol tartrate (LOPRESSOR) 100 MG tablet Take  1 tablet (100 mg total) by mouth 2 (two) times daily. NEED OV.   [DISCONTINUED] mirtazapine (REMERON) 15 MG tablet Take 1 tablet (15 mg total) by mouth at bedtime.   [DISCONTINUED] Probiotic Product (RESTORA) CAPS Take 1 capsule by mouth daily. (Patient not taking: Reported on 03/09/2023)   [DISCONTINUED] sertraline (ZOLOFT) 50 MG tablet Take 1 tablet (50 mg total) by mouth daily.   [DISCONTINUED] Testosterone 1.62 % GEL PLACE 1 PUMP ONTO THE SKIN DAILY. (Patient taking differently: Apply 1 Pump topically daily.)   [DISCONTINUED] traZODone (DESYREL) 50 MG  tablet Take 1 tablet (50 mg total) by mouth at bedtime as needed for sleep. (Patient taking differently: Take 50 mg by mouth at bedtime.)   No facility-administered encounter medications on file as of 05/20/2023.    Review of Systems  Review of Systems  Constitutional: Negative.   HENT: Negative.    Cardiovascular: Negative.   Gastrointestinal: Negative.   Allergic/Immunologic: Negative.   Neurological: Negative.   Psychiatric/Behavioral: Negative.       Objective:   BP 129/89   Pulse 63   Temp (!) 97 F (36.1 C)   Ht 5\' 8"  (1.727 m)   Wt 195 lb (88.5 kg)   SpO2 100%   BMI 29.65 kg/m   Wt Readings from Last 5 Encounters:  05/20/23 195 lb (88.5 kg)  03/09/23 190 lb 3.2 oz (86.3 kg)  02/24/23 191 lb (86.6 kg)  02/15/23 194 lb 6.4 oz (88.2 kg)  01/18/23 191 lb 8 oz (86.9 kg)     Physical Exam Vitals and nursing note reviewed.  Constitutional:      General: He is not in acute distress.    Appearance: He is well-developed.  Cardiovascular:     Rate and Rhythm: Normal rate and regular rhythm.  Pulmonary:     Effort: Pulmonary effort is normal.     Breath sounds: Normal breath sounds.  Skin:    General: Skin is warm and dry.  Neurological:     Mental Status: He is alert and oriented to person, place, and time.       Assessment & Plan:   Type 2 diabetes mellitus with other circulatory complication, without  long-term current use of insulin ([name]) -     POCT glycosylated hemoglobin (Hb A1C) -     metFORMIN HCl ER; Take 1 tablet (500 mg total) by mouth daily with breakfast.  Dispense: 30 tablet; Refill: 2  Hyperlipidemia, unspecified hyperlipidemia type -     Ezetimibe; Take 1 tablet (10 mg total) by mouth daily.  Dispense: 90 tablet; Refill: 0  Other orders -     amLODIPine Besylate; Take 1 tablet (10 mg total) by mouth daily.  Dispense: 90 tablet; Refill: 0 -     Metoprolol Tartrate; Take 1 tablet (100 mg total) by mouth 2 (two) times daily. NEED OV.  Dispense: 180 tablet; Refill: 0     Return in about 3 months (around 08/20/2023).   Ivonne Andrew, NP 05/20/2023

## 2023-05-21 ENCOUNTER — Ambulatory Visit (INDEPENDENT_AMBULATORY_CARE_PROVIDER_SITE_OTHER): Payer: MEDICAID

## 2023-05-21 DIAGNOSIS — F1420 Cocaine dependence, uncomplicated: Secondary | ICD-10-CM

## 2023-05-21 DIAGNOSIS — F1020 Alcohol dependence, uncomplicated: Secondary | ICD-10-CM

## 2023-05-21 DIAGNOSIS — F1994 Other psychoactive substance use, unspecified with psychoactive substance-induced mood disorder: Secondary | ICD-10-CM

## 2023-05-21 NOTE — Progress Notes (Signed)
Daily Group Progress Note   Program: CD IOP     Group Time: 9:00  am-12 pm      Type of Therapy: Process and Psychoeducational    Topic: The therapists check in with group members, assess for SI/HI/psychosis and overall level of functioning. The therapists inquire about sobriety date and number of community support meetings attended since last session.    The therapists introduce 3 new group members today.  The therapists discussed the benefits of attending a 12-step meeting and the benefits of getting a sponsor and solicit feedback from group members who have attended meetings to share what it is they find helpful about attending meetings and and having a sponsor.  When the issue of character defects and working steps is discussed, the therapists provide a handout with a list of character defects and their opposites.  The therapists give two new group members some step 1 worksheets so they can have a better understanding of what working a step entails.  The therapist continue a discussion on the five early challenges people face in early recovery and spend a great deal of time focusing on the topic of guilt and shame and ways to overcome this. They note that guilt and shame puts people at risk of emotional relapse while practicing self compassion and self-care is more conducive to recovery and remaining sober.  The therapists facilitate a discussion on the issue of coming out as an alcoholic or addict or being a person in recovery.  The therapists continue to emphasize the importance of being smart versus strong, and they suggest that thoughts about using cannot be stopped; however, that people in recovery can actively choose to avoid using rituals.  The therapists give examples of what seeking behavior looks like for persons who are actively in addiction.  Summary: [name] rates his depression as a "0 and his anxiety as a "3".  He says he is attending meetings, though mostly online. He says he  does not have a sponsor. [name] says his sponsor died on Weylyn's birthday in 11-25-2022. Therapist inquires if he has resolved this grief and is ready for another sponsor.  [name] says he has not thought a lot about this. Therapist encourages him to evaluate this and if it is a problem, therapist encourages him to discuss it in group. [name] discussed his perspective on the importance of community support.  Progress Towards Goals: [name] reports no change in the sobriety date.  UDS collected: No  results: None   AA/NA attended?: Yes   Sponsor?:  No   Alene Mires, MA, [place], [facility], LCAS Kenneth Eisenmenger, MS, LMFT, LCAS 05/21/2023

## 2023-05-24 ENCOUNTER — Ambulatory Visit (INDEPENDENT_AMBULATORY_CARE_PROVIDER_SITE_OTHER): Payer: MEDICAID | Admitting: Licensed Clinical Social Worker

## 2023-05-24 DIAGNOSIS — F1020 Alcohol dependence, uncomplicated: Secondary | ICD-10-CM | POA: Diagnosis not present

## 2023-05-24 DIAGNOSIS — F1420 Cocaine dependence, uncomplicated: Secondary | ICD-10-CM

## 2023-05-24 DIAGNOSIS — F17200 Nicotine dependence, unspecified, uncomplicated: Secondary | ICD-10-CM

## 2023-05-24 DIAGNOSIS — F1994 Other psychoactive substance use, unspecified with psychoactive substance-induced mood disorder: Secondary | ICD-10-CM

## 2023-05-24 NOTE — Progress Notes (Signed)
Daily Group Progress Note   Program: CD IOP     Group Time: 9:00  am-12 pm      Type of Therapy: Process and Psychoeducational    Topic: The therapists check in with group members, assess for SI/HI/psychosis and overall level of functioning. The therapists inquire about sobriety date and number of community support meetings attended since last session.    The therapists focus today on the topics of gratitude, self compassion, and how to deal with family members who are reluctant to forgive them for things that they have done while in active addiction.  The therapists discussed the benefits of keeping a gratitude journal and explain cognitive and behavioral techniques aimed at countering toxic shame and guilt while reinforcing self forgiveness and compassion.  Summary: [name] presents today rating his depression as a 1 and his anxiety as a 2.  He describes his mood as being "happy," "optimistic," and "free."  He says that he feels free as he has not had to choose between booze and cigarettes or food.  [name] admits that the early sobriety honeymoon period is over and that he is no longer on the pink cloud.  He talks about being diabetic and how when he eats chips and other junk food that he will have pain in his feet as though they are on fire.  He also talks about having orthopedic problems that makes it difficult for him to sit for 3 hours at a time in group.  During the discussion on gratitude, [name] says that he does have a gratitude pad that he keeps in his home to write down things for which she is grateful.  As for a sponsor, he admits that he is slow to get a sponsor suggesting that he wants this process to occur organically versus having to go up and ask someone to be a sponsor.   Progress Towards Goals: [name] reports no change in the sobriety date.  UDS collected: Yes results: No   AA/NA attended?: Yes   Sponsor?:  No   Alene Mires, MA, [place], [facility], LCAS Remigio Eisenmenger, MS, LMFT,  LCAS 05/24/2023

## 2023-05-26 ENCOUNTER — Ambulatory Visit (INDEPENDENT_AMBULATORY_CARE_PROVIDER_SITE_OTHER): Payer: MEDICAID | Admitting: Licensed Clinical Social Worker

## 2023-05-26 DIAGNOSIS — F17200 Nicotine dependence, unspecified, uncomplicated: Secondary | ICD-10-CM

## 2023-05-26 DIAGNOSIS — F1020 Alcohol dependence, uncomplicated: Secondary | ICD-10-CM

## 2023-05-26 DIAGNOSIS — F1994 Other psychoactive substance use, unspecified with psychoactive substance-induced mood disorder: Secondary | ICD-10-CM

## 2023-05-26 DIAGNOSIS — F1420 Cocaine dependence, uncomplicated: Secondary | ICD-10-CM

## 2023-05-26 NOTE — Progress Notes (Signed)
Daily Group Progress Note   Program: CD IOP     Group Time: 9:00  am-12 pm      Type of Therapy: Process and Psychoeducational    Topic: The therapist checks in with group members, assesses for SI/HI/psychosis and overall level of functioning. The therapist inquires about sobriety date and number of community support meetings attended since last session.    The therapist asks group members to share their stories of how they came to be in group as part of check-in today. The therapist discusses the importance of people having "social capital" versus material capital in order to find contentment. The therapist discusses the importance of being proactive in getting back to meetings and treatment if a person experiences a slip or relapse suggesting that going back to treatment is not a "failure." The therapist also discloses his belief that people do not "self-sabotage" and that "resistance" in treatment is caused when a clinician attempts to work on his or her goals for the client versus addressing the client's goals.   The therapist talks about seasonal affective disorder, major depression, and anxiety disorders and how they are treated and how self-medicating with drugs and alcohol exacerbates these conditions. The therapist instills hope in sharing the extremely high success rates for treatment clinical depression when a person is compliant with evidence-based treatment. The therapist shares some techniques for overcoming social anxiety.      Summary: Bervin presents today rating his depression as a 0 and his anxiety as a 1.  He describes his mood as being "happy," "optimistic," and "free."  He says that he was a little dismayed to realize that he recently gained 40 pounds but is going to the gym and trying to change the way that he eats in order to get this weight off.  He was attending virtual meetings only but says that he just started back to in person meetings yesterday and that he plans on  attending the Thanksgiving meeting at the [employer] tomorrow.  He emphasizes that sponsorship is a "priority" to him and that he has a process in regard to getting a sponsor saying that he wants to get the right one considering it like a marriage.  [name] shares a story with the group and talks about losing his mother, wife, brother, and 2 first cousins all within a span of 2 years saying that he was in and out of mental hospitals 3 times during this.  He shares with the group a series of recommendations that one of the doctors in the inpatient setting made to him such as doing things like making his bed every day, exercising 4 times per week, getting about 15 minutes of sunshine per day, etc. [name] says that he started doing these things when he left the hospital simply to prove this doctor wrong; however, he admits that making many of these activities part of his daily routine has helped to reduce his depression.  He says that he will still wake up each day with some severe anxiety but that he has a morning routine geared towards trying to start the day out in a good mood.   Progress Towards Goals: Magdaleno reports no change in the sobriety date.   UDS collected: No Results: No   AA/NA attended?:  Yes   Sponsor?:  No   Alene Mires, [place], [place], [facility], LCAS 05/26/2023

## 2023-05-28 ENCOUNTER — Encounter (HOSPITAL_COMMUNITY): Payer: Self-pay

## 2023-05-28 ENCOUNTER — Ambulatory Visit (HOSPITAL_COMMUNITY): Payer: MEDICAID

## 2023-05-28 ENCOUNTER — Ambulatory Visit (HOSPITAL_COMMUNITY)
Admission: RE | Admit: 2023-05-28 | Discharge: 2023-05-28 | Disposition: A | Discharge status: Home / Self Care | Payer: MEDICAID | Source: Ambulatory Visit

## 2023-05-28 VITALS — BP 136/87 | HR 57 | Temp 97.6°F | Resp 18

## 2023-05-28 DIAGNOSIS — M7581 Other shoulder lesions, right shoulder: Secondary | ICD-10-CM | POA: Diagnosis not present

## 2023-05-28 HISTORY — DX: Other specified disorders of kidney and ureter: N28.89

## 2023-05-28 MED ORDER — METHOCARBAMOL 500 MG PO TABS: 500.0000 mg | ORAL_TABLET | Freq: Two times a day (BID) | ORAL | 0 refills | Status: DC

## 2023-05-28 MED ORDER — DEXAMETHASONE SODIUM PHOSPHATE 10 MG/ML IJ SOLN
10.0000 mg | Freq: Once | INTRAMUSCULAR | Status: AC
  Administered 2023-05-28: 10 mg via INTRAMUSCULAR

## 2023-05-28 MED ORDER — IBUPROFEN 600 MG PO TABS: 600.0000 mg | ORAL_TABLET | Freq: Four times a day (QID) | ORAL | 0 refills | Status: DC | PRN

## 2023-05-28 MED ORDER — DEXAMETHASONE SODIUM PHOSPHATE 10 MG/ML IJ SOLN
INTRAMUSCULAR | Status: AC
  Filled 2023-05-28: qty 1

## 2023-05-28 NOTE — ED Provider Notes (Signed)
[facility]    CSN: [id number] Arrival date & time: 05/28/23  1012      History   Chief Complaint Chief Complaint  Patient presents with   Shoulder Pain    Acute pain with movement - Entered by patient    HPI [name] is a 62 y.o. male.   Patient presents to clinic with anterior right shoulder pain for the past week and a half.  He has not had any recent injuries, trauma or falls.  He has not tried any medications or interventions for his pain.  Thinks that shoulder pain may be from overuse.  He will lay down in bed and do work on his laptop and reach across his body with his right arm, this now causes pain and discomfort.  Reports restriction in range of motion of the right shoulder due to pain.  No previous injuries to the right arm or shoulder.  Has known osteoarthritis of most joints, including the right shoulder.  Denies bruising, swelling or deformity.  Hx of ETOH abuse, cocaine abuse, shoulder pain, stroke, heart attack, HTN, renal cyst and arthritis.   The history is provided by the patient and medical records.  Shoulder Pain   Past Medical History:  Diagnosis Date   Alcohol dependence ([name])    Allergies    Arthritis    Chest pain    Chronic lower back pain    Chronic pain of right wrist    Cocaine abuse ([name])    per note on 01/18/23   Coronary artery disease    a. Multiple prior caths/PCI. Cath 2013 with possible spasm of RCA, 70% ISR of mid LCx with subsequent DES to mLCx and prox LCX. b. H/o microvascular angina. c. Recurrent angina 08/2014 - s/p PTCA/DES to prox Cx, PTCA/CBA to OM1.  c. LHC 06/10/15 with patent stents and some ISR in LCX and OM-1 that was not flow limiting --> Rx    Dyslipidemia    a. Intolerant to many statins except tolerating Livalo.   GERD (gastroesophageal reflux disease)    H/O cardiac catheterization 10/25/2018   Heart attack [facility])    Hypertension    Myocardial infarction [facility]) ~ 2010   Renal mass    S/P  angioplasty with stent, DES, to proximal and mid LCX 12/15/11 12/15/2011   S/P foot surgery, right 04/2021   Shoulder pain    Stroke ([name])    pt. reports had a stroke around time of MI 2010   Type II diabetes mellitus ([name])    Unstable angina [facility])     Patient Active Problem List   Diagnosis Date Noted   Anxiety disorder, unspecified 02/16/2023   Alcohol use disorder 02/16/2023   Sleep disorder, unspecified 02/16/2023   Alcohol abuse 02/15/2023   Alcohol dependence ([name]) 05/16/2022   Low energy 03/06/2022   Moderate alcohol dependence in early remission ([name]) 02/24/2022   Suicidal ideation 07/07/2021   Generalized anxiety disorder 07/07/2021   Other allergic rhinitis 09/09/2020   Adverse reaction to food, subsequent encounter 09/09/2020   History of bee sting allergy 09/09/2020   Alcohol dependence with alcohol-induced mood disorder ([name]) 03/19/2020   MDD (major depressive disorder), recurrent episode, severe ([name]) 03/18/2020   History of substance abuse ([name]) 01/04/2020   Severe 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 features ([name]) 12/10/2019   Tinea cruris 07/06/2019   Hemoglobin A1c less than 7.0% 07/06/2019   Muscle strain of wrist 04/05/2019   Pain in both wrists 04/05/2019  Anxiety 04/05/2019   Prediabetes 04/05/2019   MDD (major depressive disorder) 01/12/2019   Chest pain 10/25/2018   MDD (major depressive disorder), recurrent severe, without psychosis ([name]) 08/15/2018   Influenza A 07/17/2017   Lactic acidosis    Cough    Nausea and vomiting    Severe episode of recurrent major depressive disorder, without psychotic features ([name])    MDD (major depressive disorder), single episode, severe with psychotic features ([name]) 06/27/2017   MDD (major depressive disorder), severe ([name]) 06/23/2017   Essential hypertension 01/13/2016   Unstable angina ([name])    ED (erectile dysfunction) 02/15/2014   Atypical chest pain 12/14/2011   Allergy to contrast media (used for  diagnostic x-rays) 05/12/2011   Dyslipidemia, goal LDL below 70 05/09/2011   DM2 (diabetes mellitus, type 2) ([name]) 07/10/2010   CAD S/P percutaneous coronary angioplasty 07/10/2010    Past Surgical History:  Procedure Laterality Date   CARDIAC CATHETERIZATION  06/15/2002   LAD with prox 40% stenosis, norma L main, Cfx with 25% lesion, RCA with long mid 25% stenosis (Dr. Daiva Nakayama)   CARDIAC CATHETERIZATION  04/01/2010   normal L main, LAD wit mild stenosis, L Cfx with 70% in-stent restenosis, RCA with 70% in-stent restenosis, LVEF >60% (Dr. K. [place] Hilty) - cutting ballon arthrectomy to RCA & Cfx (Dr. Langston Reusing)   CARDIAC CATHETERIZATION  08/25/2010   preserved global LV contractility; multivessel CAD, diffuse 90-95% in-stent restenosis in prox placed Cfx stent - cutting balloon arthrectomy in Cfx with multiple dilatations 90-95% to 0% stenosis (Dr. Bishop Limbo)   CARDIAC CATHETERIZATION  01/26/2011   PCI & stenting of aggresive in-stent restenosis within previously stented AV groove Cfx with 3.0x60mm Taxus DES (previous stents were Promus) (Dr. Erlene Quan)   CARDIAC CATHETERIZATION  05/11/2011   preserved LV function, 40% mid LAD stenosis, 30-40% narrowing proximal to stented semgnet of prox Cfx, patent mid RCA stent with smooth 20% narrowing in distal RCA (Dr. Bishop Limbo)   CARDIAC CATHETERIZATION  12/15/2011   PCI & stenting of proximal & mid Cfx with DES - 3.0x61mm in proximal, 3.0x76mm in mid (Dr. Erlene Quan)   CARDIAC CATHETERIZATION N/A 06/10/2015   Procedure: Left Heart Cath and Coronary Angiography;  Surgeon: Dolores Patty, MD;  Location: [facility] INVASIVE CV LAB;  Service: Cardiovascular;  Laterality: N/A;   CARDIAC CATHETERIZATION  10/25/2018   cardiometabolic testing  08/08/2012   good exercise effort, peak VO2 79% predicted with normal VO2 HR curves (mild deconditioning)   COLONOSCOPY  12/2012   diminutive hyperplastic sigmoid poyp so repeat routine 2024   CORONARY BALLOON ANGIOPLASTY N/A  10/25/2018   Procedure: CORONARY BALLOON ANGIOPLASTY;  Surgeon: Corky Crafts, MD;  [facility];  Service: Cardiovascular;  Laterality: N/A;   CORONARY BALLOON ANGIOPLASTY N/A 09/29/2019   Procedure: CORONARY BALLOON ANGIOPLASTY;  Surgeon: Yvonne Kendall, MD;  [facility] LAB;  Service: Cardiovascular;  Laterality: N/A;   CORONARY ULTRASOUND/IVUS N/A 09/29/2019   Procedure: Intravascular Ultrasound/IVUS;  Surgeon: Yvonne Kendall, MD;  Location[facility] LAB;  Service: Cardiovascular;  Laterality: N/A;   EXCISIONAL HEMORRHOIDECTOMY  1984   LEFT HEART CATH AND CORONARY ANGIOGRAPHY N/A 10/25/2018   Procedure: LEFT HEART CATH AND CORONARY ANGIOGRAPHY;  Surgeon: Corky Crafts, MD;  Location: [facility] INVASIVE CV LAB;  Service: Cardiovascular;  Laterality: N/A;   LEFT HEART CATH AND CORONARY ANGIOGRAPHY N/A 09/29/2019   Procedure: LEFT HEART CATH AND CORONARY ANGIOGRAPHY;  Surgeon: Yvonne Kendall, MD;  [facility];  Service: Cardiovascular;  Laterality: N/A;   LEFT HEART CATHETERIZATION WITH CORONARY ANGIOGRAM N/A 05/11/2011   Procedure: LEFT HEART CATHETERIZATION WITH CORONARY ANGIOGRAM;  Surgeon: Lennette Bihari, MD;  Location: [facility] CATH LAB;  Service: Cardiovascular;  Laterality: N/A;  Possible percutaneous coronary intervention, possible IVUS   LEFT HEART CATHETERIZATION WITH CORONARY ANGIOGRAM N/A 12/15/2011   Procedure: LEFT HEART CATHETERIZATION WITH CORONARY ANGIOGRAM;  Surgeon: Runell Gess, MD;  Location: [facility] CATH LAB;  Service: Cardiovascular;  Laterality: N/A;   LEFT HEART CATHETERIZATION WITH CORONARY ANGIOGRAM N/A 09/05/2014   Procedure: LEFT HEART CATHETERIZATION WITH CORONARY ANGIOGRAM;  Surgeon: Kathleene Hazel, MD;  Location: [facility] CATH LAB;  Service: Cardiovascular;  Laterality: N/A;   LIPOMA EXCISION     back of the head   NM MYOCAR PERF WALL MOTION  02/2012   lexiscan myoview; mild perfusion defect in mid inferolateral &  basal inferolateral region (infarct/scar); EF 52%, abnormal but ow risk scan   PERCUTANEOUS CORONARY STENT INTERVENTION (PCI-S)  09/05/2014   Procedure: PERCUTANEOUS CORONARY STENT INTERVENTION (PCI-S);  Surgeon: Kathleene Hazel, MD;  Location: [facility] CATH LAB;  Service: Cardiovascular;;   TONSILLECTOMY         Home Medications    Prior to Admission medications   Medication Sig Start Date End Date Taking? Authorizing Provider  amLODipine (NORVASC) 10 MG tablet Take 1 tablet (10 mg total) by mouth daily. 05/20/23  Yes Ivonne Andrew, NP  Cholecalciferol 25 MCG (1000 UT) tablet Take 1,000 Units by mouth daily.   Yes [provider]  clopidogrel (PLAVIX) 75 MG tablet Take 1 tablet (75 mg total) by mouth daily. 02/15/23  Yes Ivonne Andrew, NP  ezetimibe (ZETIA) 10 MG tablet Take 1 tablet (10 mg total) by mouth daily. 05/20/23  Yes Ivonne Andrew, NP  ibuprofen (ADVIL) 600 MG tablet Take 1 tablet (600 mg total) by mouth every 6 (six) hours as needed. 05/28/23  Yes Rinaldo Ratel, [place], FNP  LIVALO 4 MG TABS Take 1 tablet by mouth once daily 04/07/23  Yes Hilty, Lisette Abu, MD  metFORMIN (GLUCOPHAGE-XR) 500 MG 24 hr tablet Take 1 tablet (500 mg total) by mouth daily with breakfast. 05/20/23 08/18/23 Yes Ivonne Andrew, NP  methocarbamol (ROBAXIN) 500 MG tablet Take 1 tablet (500 mg total) by mouth 2 (two) times daily. 05/28/23  Yes Rinaldo Ratel, [place] N, FNP  metoprolol tartrate (LOPRESSOR) 100 MG tablet Take 1 tablet (100 mg total) by mouth 2 (two) times daily. NEED OV. 05/20/23  Yes Ivonne Andrew, NP  Multiple Vitamin (MULTIVITAMIN WITH MINERALS) TABS tablet Take 1 tablet by mouth daily. 08/13/22  Yes Park Pope, MD  ACCU-CHEK GUIDE test strip USE 1 STRIP TO CHECK GLUCOSE THREE TIMES DAILY AS DIRECTED 04/05/23   Ivonne Andrew, NP  aspirin EC 81 MG tablet Take by mouth.    [provider]  EPINEPHrine 0.3 mg/0.3 mL IJ SOAJ injection Inject 0.3 mg into the muscle as needed  for anaphylaxis. Patient not taking: Reported on 03/09/2023 05/07/21   Verlee Monte, MD  levofloxacin (LEVAQUIN) 500 MG tablet Take 1 tablet (500 mg total) by mouth daily. 05/16/23   Raspet, Noberto Retort, PA-C  nitroGLYCERIN (NITROSTAT) 0.4 MG SL tablet Place 1 tablet (0.4 mg total) under the tongue every 5 (five) minutes x 3 doses as needed for chest pain. Patient not taking: Reported on 03/09/2023 07/18/21   Chrystie Nose, MD  Polyethyl Glycol-Propyl Glycol (SYSTANE)  0.4-0.3 % GEL ophthalmic gel Place 1 Application into both eyes daily as needed (For dry eyes). Patient not taking: Reported on 03/09/2023    [provider]  sildenafil (VIAGRA) 100 MG tablet Take 100 mg by mouth daily as needed for erectile dysfunction. Patient not taking: Reported on 03/09/2023 10/17/22   [provider]    Family History Family History  Problem Relation Age of Onset   Leukemia Mother    Prostate cancer Father    Cancer Brother    Coronary artery disease Paternal Grandmother    Cancer Paternal Grandfather    Migraines Neg Hx    Headache Neg Hx    Colon cancer Neg Hx    Esophageal cancer Neg Hx    Rectal cancer Neg Hx    Stomach cancer Neg Hx     Social History Social History   Tobacco Use   Smoking status: Former    Current packs/day: 0.00    Average packs/day: 1 pack/day for 10.0 years (10.0 ttl pk-yrs)    Types: Cigarettes    Start date: 10/06/2008    Quit date: 10/07/2018    Years since quitting: 4.6   Smokeless tobacco: Never  Vaping Use   Vaping status: Never Used  Substance Use Topics   Alcohol use: Not Currently    Comment: 05/28/2023-stopped drinking 22 days ago.   Drug use: Not Currently    Types: Cocaine     Allergies   Bee venom, Gadolinium, Shellfish allergy, Statins, and Testosterone cypionate   Review of Systems Review of Systems  Per HPI   Physical Exam Triage Vital Signs ED Triage Vitals  Encounter Vitals Group     BP 05/28/23 1052 136/87      Systolic BP Percentile --      Diastolic BP Percentile --      Pulse Rate 05/28/23 1052 (!) 57     Resp 05/28/23 1052 18     Temp 05/28/23 1052 97.6 F (36.4 C)     Temp Source 05/28/23 1052 Oral     SpO2 05/28/23 1052 95 %     Weight --      Height --      Head Circumference --      Peak Flow --      Pain Score 05/28/23 1049 8     Pain Loc --      Pain Education --      Exclude from Growth Chart --    No data found.  Updated Vital Signs BP 136/87 (BP Location: Right Arm)   Pulse (!) 57   Temp 97.6 F (36.4 C) (Oral)   Resp 18   SpO2 95%   Visual Acuity Right Eye Distance:   Left Eye Distance:   Bilateral Distance:    Right Eye Near:   Left Eye Near:    Bilateral Near:     Physical Exam Vitals and nursing note reviewed.  Constitutional:      Appearance: Normal appearance.  HENT:     Head: Normocephalic and atraumatic.     Right Ear: External ear normal.     Left Ear: External ear normal.     Nose: Nose normal.     Mouth/Throat:     Mouth: Mucous membranes are moist.  Eyes:     Conjunctiva/sclera: Conjunctivae normal.  Cardiovascular:     Rate and Rhythm: Normal rate.  Pulmonary:     Effort: Pulmonary effort is normal. No respiratory distress.  Musculoskeletal:  General: Tenderness present.     Right shoulder: Tenderness present. No swelling or deformity. Decreased range of motion. Normal strength. Normal pulse.     Comments: Positive empty can test on the right side.  Limited internal and external rotation to the shoulder.  No obvious deformity.  Anterior shoulder tenderness to palpation.  Atraumatic.  Skin:    General: Skin is warm and dry.  Neurological:     General: No focal deficit present.     Mental Status: He is alert.  Psychiatric:        Mood and Affect: Mood normal.        Behavior: Behavior is cooperative.      UC Treatments / Results  Labs (all labs ordered are listed, but only abnormal results are displayed) Labs Reviewed - No  data to display  EKG   Radiology No results found.  Procedures Procedures (including critical care time)  Medications Ordered in UC Medications  dexamethasone (DECADRON) injection 10 mg (has no administration in time range)    Initial Impression / Assessment and Plan / UC Course  I have reviewed the triage vital signs and the nursing notes.  Pertinent labs & imaging results that were available during my care of the patient were reviewed by me and considered in my medical decision making (see chart for details).  Vitals and triage reviewed, patient is hemodynamically stable.  Positive empty can test on physical exam, suspect rotator cuff tendinopathy.  Atraumatic shoulder pain, imaging deferred as I do not feel as if this would be beneficial, patient has known osteoarthritis of his shoulder.  Recent A1c of 7.2, will trial IM Decadron one-time only in clinic for pain and inflammation.  Advised muscle relaxers and NSAIDs to help with pain and inflammation.  Orthopedic and PCP follow-up.  Plan of care, follow-up care return precautions given, no questions at the time.     Final Clinical Impressions(s) / UC Diagnoses   Final diagnoses:  Tendinitis of right rotator cuff     Discharge Instructions      We have given you a steroid injection to help with pain and inflammation.  You can take the ibuprofen with food every 6 hours as needed.  In commendation with your Plavix we will increase the risk of bleeding, be cautious with this and stop the medication if you notice any blood in your stool or extensive bruising.  You can heat or ice the area and perform gentle stretching as tolerated.  Take the muscle relaxer as needed, do not drink alcohol or drive on this medication as it may cause drowsiness.  If your shoulder pain persist despite these interventions, follow-up with your primary care provider or an orthopedic for further evaluation.      ED Prescriptions     Medication Sig  Dispense Auth. Provider   methocarbamol (ROBAXIN) 500 MG tablet Take 1 tablet (500 mg total) by mouth 2 (two) times daily. 20 tablet Rinaldo Ratel, [place], [place]   ibuprofen (ADVIL) 600 MG tablet Take 1 tablet (600 mg total) by mouth every 6 (six) hours as needed. 30 tablet Chudney Scheffler, [place], [place]      PDMP not reviewed this encounter.   Myrick Mcnairy, [place] N, [place] 05/28/23 1113

## 2023-05-28 NOTE — Discharge Instructions (Addendum)
We have given you a steroid injection to help with pain and inflammation.  You can take the ibuprofen with food every 6 hours as needed.  In commendation with your Plavix we will increase the risk of bleeding, be cautious with this and stop the medication if you notice any blood in your stool or extensive bruising.  You can heat or ice the area and perform gentle stretching as tolerated.  Take the muscle relaxer as needed, do not drink alcohol or drive on this medication as it may cause drowsiness.  If your shoulder pain persist despite these interventions, follow-up with your primary care provider or an orthopedic for further evaluation.

## 2023-05-28 NOTE — ED Triage Notes (Signed)
Pt states he has right shoulder pain X 1.5 week. He denies any injury.  He states he hasn't taken anything for pain since he was recently dx with renal mass so he isnt sure what to take.

## 2023-05-31 ENCOUNTER — Ambulatory Visit (INDEPENDENT_AMBULATORY_CARE_PROVIDER_SITE_OTHER): Payer: MEDICAID | Admitting: Licensed Clinical Social Worker

## 2023-05-31 DIAGNOSIS — F1020 Alcohol dependence, uncomplicated: Secondary | ICD-10-CM

## 2023-05-31 DIAGNOSIS — F1420 Cocaine dependence, uncomplicated: Secondary | ICD-10-CM

## 2023-05-31 DIAGNOSIS — F17200 Nicotine dependence, unspecified, uncomplicated: Secondary | ICD-10-CM

## 2023-05-31 DIAGNOSIS — F1994 Other psychoactive substance use, unspecified with psychoactive substance-induced mood disorder: Secondary | ICD-10-CM

## 2023-05-31 NOTE — Progress Notes (Signed)
Daily Group Progress Note   Program: CD IOP     Group Time: 9:00  am-12 pm      Type of Therapy: Process and Psychoeducational    Topic: The therapists check in with group members, assess for SI/HI/psychosis and overall level of functioning. The therapists inquire about sobriety date and number of community support meetings attended since last session.    The therapists facilitate group discussions on a variety of topics including the importance of humility as it relates to recovery, trust issues and early recovery, the purpose of the resentment chip, and the reason that it is recommended that people in early recovery do not start new romantic relationships until they have at least a year of sobriety.  The therapists educate group members about process addictions focusing on sex addictions and being romance addicted and how this relates to chemical addiction.   The therapists discuss triggers specifically noting the challenge involved in letting go of using friends and/or significant others.  The therapists point out the fact that recovery will force them to make very difficult choices but that in order to maintain long-term sobriety that a person must put his or her recovery above all else.     Summary: [name] presents today rating both his depression and anxiety as being a 0.  He describes his mood as being "happy" and "thankful."   He says that he has no change in his sobriety date. He attended two meetings with one being at the [employer] on Thanksgiving. As this meeting was standing room only, he was "disappointed" and ended up leaving having an "uneventful" Thanksgiving. He concludes that he likes his quiet and that one of his favorite times is being up at 4:30 a.m. drinking his tea in the dark.   For the remainder of group, he is quiet and attentive but does not add any input during the group discussions.    Progress Towards Goals: [name] reports no change in his sobriety date.    UDS  collected: Yes Results: No   AA/NA attended?:  Yes  Sponsor?:  No   Alene Mires, MA, [place], [facility], LCAS Remigio Eisenmenger, MS, LMFT, LCAS 05/31/2023

## 2023-06-02 ENCOUNTER — Ambulatory Visit (INDEPENDENT_AMBULATORY_CARE_PROVIDER_SITE_OTHER): Payer: MEDICAID

## 2023-06-02 DIAGNOSIS — F1020 Alcohol dependence, uncomplicated: Secondary | ICD-10-CM | POA: Diagnosis not present

## 2023-06-02 DIAGNOSIS — F1420 Cocaine dependence, uncomplicated: Secondary | ICD-10-CM

## 2023-06-02 DIAGNOSIS — F1994 Other psychoactive substance use, unspecified with psychoactive substance-induced mood disorder: Secondary | ICD-10-CM

## 2023-06-02 NOTE — Progress Notes (Signed)
Daily Group Progress Note   Program: CD IOP     Group Time: 9:00  am-12 pm      Type of Therapy: Process and Psychoeducational    Topic: The therapists check in with group members, assess for SI/HI/psychosis and overall level of functioning. The therapists inquire about sobriety date and number of community support meetings attended since last session.   The therapists present information and prompt discussion on the Spirituality Platform by Bernardo Heater, [place], MS, [facility], LCAS which includes choice, relationships, awe and choice and how each of these components is integrally related to the disease of addiction.  Therapists additionally discussed the varied time it takes for  individuals to move through the recovery process and  the individual ways each person can achieve recovery. Therapist discuss what it means to be a dry drunk.     Summary: [name] presents today rating both his depression as a "1" and anxiety as being a "1".  [name] describes his emotions as "happy and optimistic and free". He shares that he has an upcoming endoscopy. [name] reports the same sobriety date.  He is attending meetings but does not have a sponsor. He discusses that trust is such a major issue for him and that is why it takes him a while to settle on a sponsor. A peer in the group challenges him to attend more meetings as that was what was helpful to the peers, and [name] responded that "doing recovery" the peers way did not represent balance in life for him. [name] shares that his day started off good and then he found out from his bank that one his accounts had been comprised.  He says they are working with him to get this issues straightened out.   Progress Towards Goals: [name] reports no change in his sobriety date.    UDS collected: No Results: No   AA/NA attended?:  Yes  Sponsor?:  No   Kenneth Mires, MA, [place], [facility], LCAS Kenneth Eisenmenger, MS, LMFT, LCAS 06/02/2023

## 2023-06-03 ENCOUNTER — Encounter (HOSPITAL_COMMUNITY): Payer: Self-pay

## 2023-06-03 ENCOUNTER — Ambulatory Visit (INDEPENDENT_AMBULATORY_CARE_PROVIDER_SITE_OTHER): Payer: MEDICAID

## 2023-06-03 ENCOUNTER — Ambulatory Visit (HOSPITAL_COMMUNITY)
Admission: RE | Admit: 2023-06-03 | Discharge: 2023-06-03 | Disposition: A | Discharge status: Home / Self Care | Payer: MEDICAID | Source: Ambulatory Visit | Attending: Family Medicine | Admitting: Family Medicine

## 2023-06-03 VITALS — BP 129/93 | HR 79 | Temp 97.9°F | Resp 18

## 2023-06-03 DIAGNOSIS — R079 Chest pain, unspecified: Secondary | ICD-10-CM

## 2023-06-03 MED ORDER — CYCLOBENZAPRINE HCL 10 MG PO TABS: ORAL_TABLET | ORAL | 0 refills | Status: DC

## 2023-06-03 MED ORDER — PREDNISONE 20 MG PO TABS: 40.0000 mg | ORAL_TABLET | Freq: Every day | ORAL | 0 refills | Status: DC

## 2023-06-03 NOTE — Discharge Instructions (Addendum)
Be aware, your blood sugars will rise while taking prednisone. Monitor closely.

## 2023-06-03 NOTE — ED Provider Notes (Addendum)
[facility] CARE CENTER   [id number] 06/03/23 Arrival Time: 0940  ASSESSMENT & PLAN:  1. Chest pain, unspecified type    Patient history and exam consistent with non-cardiac cause of chest pain.  ECG: Performed today and interpreted by me: normal EKG, normal sinus rhythm. No STEMI.  I have personally viewed and independently interpreted the imaging studies ordered this visit. CXR: no acute changes; no pneumothorax.  Suspect MSK CP. Discussed. Trial of: Meds ordered this encounter  Medications   predniSONE (DELTASONE) 20 MG tablet    Sig: Take 2 tablets (40 mg total) by mouth daily.    Dispense:  10 tablet    Refill:  0   cyclobenzaprine (FLEXERIL) 10 MG tablet    Sig: Take 1 tablet by mouth 3 times daily as needed for muscle spasm. Warning: May cause drowsiness.    Dispense:  21 tablet    Refill:  0    Follow-up Information     Ivonne Andrew, NP.   Specialties: Pulmonary Disease, Endocrinology Why: As needed. Contact information: [address] [place] [address] [phone]         [facility].   Specialty: Urgent Care Why: If worsening or failing to improve as anticipated. Contact information: [address] [place] [address] [phone]                 Discharge Instructions      Be aware, your blood sugars will rise while taking prednisone. Monitor closely.     Verbal chest pain precautions given. Reviewed expectations re: course of current medical issues. Questions answered. Outlined signs and symptoms indicating need for more acute intervention. Patient verbalized understanding. After Visit Summary given.   SUBJECTIVE:  History from: patient. [name] is a 62 y.o. male who presents with complaint of intermittent anterior chest pain described as a sharp pain, esp with deep breaths. Affects sleep as he reports it is difficult to get into a comfortable position. Does  have associated mid back pain also. Both are worse with certain movements. Denies injury/trauma. Denies assoc n/v/diaphoresis/SOB. Controlled DM. No tx PTA. Denies recreational drug use.  Social History   Tobacco Use  Smoking Status Former   Current packs/day: 0.00   Average packs/day: 1 pack/day for 10.0 years (10.0 ttl pk-yrs)   Types: Cigarettes   Start date: 10/06/2008   Quit date: 10/07/2018   Years since quitting: 4.6  Smokeless Tobacco Never   Social History   Substance and Sexual Activity  Alcohol Use Not Currently   Comment: 05/28/2023-stopped drinking 22 days ago.   OBJECTIVE:  Vitals:   06/03/23 1028  BP: (!) 129/93  Pulse: 79  Resp: 18  Temp: 97.9 F (36.6 C)  TempSrc: Oral  SpO2: 96%    General appearance: alert, oriented, no acute distress Eyes: PERRLA; EOMI; conjunctivae normal HENT: normocephalic; atraumatic Neck: supple with FROM Lungs: without labored respirations; speaks full sentences without difficulty; CTAB Heart: regular rate and rhythm without murmer Chest Wall: with tenderness to palpation over upper chest wall, more so on left Abdomen: soft, non-tender; no guarding or rebound tenderness Extremities: without edema; without calf swelling or tenderness; symmetrical without gross deformities Skin: warm and dry; without rash or lesions Neuro: normal gait Psychological: alert and cooperative; normal mood and affect  Labs: Results for orders placed or performed in visit on 05/20/23  POCT glycosylated hemoglobin (Hb A1C)  Result Value Ref Range   Hemoglobin A1C  6.8 (A) 4.0 - 5.6 %   HbA1c POC (<> result, manual entry)     HbA1c, POC (prediabetic range)     HbA1c, POC (controlled diabetic range)     Imaging: DG Chest 2 View  Result Date: 06/03/2023 CLINICAL DATA:  Chest and back pain while taking a deep breath for over 1 week. EXAM: CHEST - 2 VIEW COMPARISON:  03/24/2020. FINDINGS: Cardiac silhouette is normal in size and configuration. Stable  left-sided coronary artery stent. No mediastinal or hilar masses. No evidence of adenopathy. Clear lungs.  No pleural effusion or pneumothorax. Skeletal structures are intact. IMPRESSION: No active cardiopulmonary disease. Electronically Signed   By: Amie Portland M.D.   On: 06/03/2023 11:31     Allergies  Allergen Reactions   Bee Venom Anaphylaxis, Hives, Itching and Other (See Comments)    Red eyes   Gadolinium Hives, Itching, Swelling and Other (See Comments)    Swelling of eyes after receiving MR contrast. 13 hr prep recommended. Pt developed large hive and swelling under right eye after contrast, given 50mg  iv benadryl, pt will need premed before gadolinium//lh   Shellfish Allergy Anaphylaxis, Hives, Itching and Other (See Comments)    Red eyes   Statins Other (See Comments)    Myalgias. Tolerating livalo. Pain    Testosterone Cypionate Other (See Comments)    Testerone Injection --Increased breast tissue     Past Medical History:  Diagnosis Date   Alcohol dependence ([name])    Allergies    Arthritis    Chest pain    Chronic lower back pain    Chronic pain of right wrist    Cocaine abuse ([name])    per note on 01/18/23   Coronary artery disease    a. Multiple prior caths/PCI. Cath 2013 with possible spasm of RCA, 70% ISR of mid LCx with subsequent DES to mLCx and prox LCX. b. H/o microvascular angina. c. Recurrent angina 08/2014 - s/p PTCA/DES to prox Cx, PTCA/CBA to OM1.  c. LHC 06/10/15 with patent stents and some ISR in LCX and OM-1 that was not flow limiting --> Rx    Dyslipidemia    a. Intolerant to many statins except tolerating Livalo.   GERD (gastroesophageal reflux disease)    H/O cardiac catheterization 10/25/2018   Heart attack [facility])    Hypertension    Myocardial infarction [facility] ~ 2010   Renal mass    S/P angioplasty with stent, DES, to proximal and mid LCX 12/15/11 12/15/2011   S/P foot surgery, right 04/2021   Shoulder pain    Stroke [facility])    pt. reports had a  stroke around time of MI 2010   Type II diabetes mellitus ([name])    Unstable angina ([name])    Social History   Socioeconomic History   Marital status: Widowed    Spouse name: Not on file   Number of children: 2   Years of education: GED   Highest education level: Some college, no degree  Occupational History   Not on file  Tobacco Use   Smoking status: Former    Current packs/day: 0.00    Average packs/day: 1 pack/day for 10.0 years (10.0 ttl pk-yrs)    Types: Cigarettes    Start date: 10/06/2008    Quit date: 10/07/2018    Years since quitting: 4.6   Smokeless tobacco: Never  Vaping Use   Vaping status: Never Used  Substance and Sexual Activity   Alcohol use: Not Currently  Comment: 05/28/2023-stopped drinking 22 days ago.   Drug use: Not Currently    Types: Cocaine   Sexual activity: Not Currently  Other Topics Concern   Not on file  Social History Narrative   ** Merged History Encounter **    Pt states he only smoke when he drinks   Social Determinants of Health   Financial Resource Strain: Medium Risk (05/19/2023)   Overall Financial Resource Strain (CARDIA)    Difficulty of Paying Living Expenses: Somewhat hard  Food Insecurity: Food Insecurity Present (05/19/2023)   Hunger Vital Sign    Worried About Running Out of Food in the Last Year: Often true    Ran Out of Food in the Last Year: Sometimes true  Transportation Needs: No Transportation Needs (05/19/2023)   PRAPARE - Administrator, Civil Service (Medical): No    Lack of Transportation (Non-Medical): No  Recent Concern: Transportation Needs - Unmet Transportation Needs (05/07/2023)   Received from [facility] - Transportation    Lack of Transportation (Medical): Yes    Lack of Transportation (Non-Medical): No  Physical Activity: Insufficiently Active (05/19/2023)   Exercise Vital Sign    Days of Exercise per Week: 5 days    Minutes of Exercise per Session: 20 min  Stress: No  Stress Concern Present (05/19/2023)   [employer] of Occupational Health - Occupational Stress Questionnaire    Feeling of Stress : Only a little  Recent Concern: Stress - Stress Concern Present (05/03/2023)   Received from [facility] of Occupational Health - Occupational Stress Questionnaire    Feeling of Stress : Very much  Social Connections: Moderately Isolated (05/19/2023)   Social Connection and Isolation Panel [NHANES]    Frequency of Communication with Friends and Family: Once a week    Frequency of Social Gatherings with Friends and Family: Once a week    Attends Religious Services: More than 4 times per year    Active Member of [employer] or Organizations: Yes    Attends Banker Meetings: More than 4 times per year    Marital Status: Widowed  Intimate Partner Violence: Not At Risk (05/03/2023)   Received from [facility]   HITS    Over the last 12 months how often did your partner physically hurt you?: Never    Over the last 12 months how often did your partner insult you or talk down to you?: Never    Over the last 12 months how often did your partner threaten you with physical harm?: Never    Over the last 12 months how often did your partner scream or curse at you?: Never   Family History  Problem Relation Age of Onset   Leukemia Mother    Prostate cancer Father    Cancer Brother    Coronary artery disease Paternal Grandmother    Cancer Paternal Grandfather    Migraines Neg Hx    Headache Neg Hx    Colon cancer Neg Hx    Esophageal cancer Neg Hx    Rectal cancer Neg Hx    Stomach cancer Neg Hx    Past Surgical History:  Procedure Laterality Date   CARDIAC CATHETERIZATION  06/15/2002   LAD with prox 40% stenosis, norma L main, Cfx with 25% lesion, RCA with long mid 25% stenosis (Dr. Daiva Nakayama)   CARDIAC CATHETERIZATION  04/01/2010   normal L main, LAD wit mild stenosis, L Cfx with 70% in-stent restenosis,  RCA with 70%  in-stent restenosis, LVEF >60% (Dr. K. Italy Hilty) - cutting ballon arthrectomy to RCA & Cfx (Dr. Langston Reusing)   CARDIAC CATHETERIZATION  08/25/2010   preserved global LV contractility; multivessel CAD, diffuse 90-95% in-stent restenosis in prox placed Cfx stent - cutting balloon arthrectomy in Cfx with multiple dilatations 90-95% to 0% stenosis (Dr. Bishop Limbo)   CARDIAC CATHETERIZATION  01/26/2011   PCI & stenting of aggresive in-stent restenosis within previously stented AV groove Cfx with 3.0x47mm Taxus DES (previous stents were Promus) (Dr. Erlene Quan)   CARDIAC CATHETERIZATION  05/11/2011   preserved LV function, 40% mid LAD stenosis, 30-40% narrowing proximal to stented semgnet of prox Cfx, patent mid RCA stent with smooth 20% narrowing in distal RCA (Dr. Bishop Limbo)   CARDIAC CATHETERIZATION  12/15/2011   PCI & stenting of proximal & mid Cfx with DES - 3.0x82mm in proximal, 3.0x68mm in mid (Dr. Erlene Quan)   CARDIAC CATHETERIZATION N/A 06/10/2015   Procedure: Left Heart Cath and Coronary Angiography;  Surgeon: Dolores Patty, MD;  Location: [facility] INVASIVE CV LAB;  Service: Cardiovascular;  Laterality: N/A;   CARDIAC CATHETERIZATION  10/25/2018   cardiometabolic testing  08/08/2012   good exercise effort, peak VO2 79% predicted with normal VO2 HR curves (mild deconditioning)   COLONOSCOPY  12/2012   diminutive hyperplastic sigmoid poyp so repeat routine 2024   CORONARY BALLOON ANGIOPLASTY N/A 10/25/2018   Procedure: CORONARY BALLOON ANGIOPLASTY;  Surgeon: Corky Crafts, MD;  Location: [facility] INVASIVE CV LAB;  Service: Cardiovascular;  Laterality: N/A;   CORONARY BALLOON ANGIOPLASTY N/A 09/29/2019   Procedure: CORONARY BALLOON ANGIOPLASTY;  Surgeon: Yvonne Kendall, MD;  Location: [facility] INVASIVE CV LAB;  Service: Cardiovascular;  Laterality: N/A;   CORONARY ULTRASOUND/IVUS N/A 09/29/2019   Procedure: Intravascular Ultrasound/IVUS;  Surgeon: Yvonne Kendall, MD;  Location: [facility] INVASIVE CV LAB;  Service:  Cardiovascular;  Laterality: N/A;   EXCISIONAL HEMORRHOIDECTOMY  1984   LEFT HEART CATH AND CORONARY ANGIOGRAPHY N/A 10/25/2018   Procedure: LEFT HEART CATH AND CORONARY ANGIOGRAPHY;  Surgeon: Corky Crafts, MD;  Location: [facility] INVASIVE CV LAB;  Service: Cardiovascular;  Laterality: N/A;   LEFT HEART CATH AND CORONARY ANGIOGRAPHY N/A 09/29/2019   Procedure: LEFT HEART CATH AND CORONARY ANGIOGRAPHY;  Surgeon: Yvonne Kendall, MD;  Location: [facility] INVASIVE CV LAB;  Service: Cardiovascular;  Laterality: N/A;   LEFT HEART CATHETERIZATION WITH CORONARY ANGIOGRAM N/A 05/11/2011   Procedure: LEFT HEART CATHETERIZATION WITH CORONARY ANGIOGRAM;  Surgeon: Lennette Bihari, MD;  [facility] CATH LAB;  Service: Cardiovascular;  Laterality: N/A;  Possible percutaneous coronary intervention, possible IVUS   LEFT HEART CATHETERIZATION WITH CORONARY ANGIOGRAM N/A 12/15/2011   Procedure: LEFT HEART CATHETERIZATION WITH CORONARY ANGIOGRAM;  Surgeon: Runell Gess, MD;  Location: [facility] CATH LAB;  Service: Cardiovascular;  Laterality: N/A;   LEFT HEART CATHETERIZATION WITH CORONARY ANGIOGRAM N/A 09/05/2014   Procedure: LEFT HEART CATHETERIZATION WITH CORONARY ANGIOGRAM;  Surgeon: Kathleene Hazel, MD;  Location: [facility] CATH LAB;  Service: Cardiovascular;  Laterality: N/A;   LIPOMA EXCISION     back of the head   NM MYOCAR PERF WALL MOTION  02/2012   lexiscan myoview; mild perfusion defect in mid inferolateral & basal inferolateral region (infarct/scar); EF 52%, abnormal but ow risk scan   PERCUTANEOUS CORONARY STENT INTERVENTION (PCI-S)  09/05/2014   Procedure: PERCUTANEOUS CORONARY STENT INTERVENTION (PCI-S);  Surgeon: Kathleene Hazel, MD;  Location: [facility] CATH LAB;  Service: Cardiovascular;;   TONSILLECTOMY  Mardella Layman, MD 06/03/23 1157    Mardella Layman, MD 06/03/23 [phone]

## 2023-06-03 NOTE — ED Triage Notes (Signed)
Pt c/o pain to chest and back when taking a deep breath for over a week. States worse at night. Denies SOB. Denies taking any meds for sx's.

## 2023-06-04 ENCOUNTER — Ambulatory Visit (INDEPENDENT_AMBULATORY_CARE_PROVIDER_SITE_OTHER): Payer: MEDICAID

## 2023-06-04 DIAGNOSIS — F17200 Nicotine dependence, unspecified, uncomplicated: Secondary | ICD-10-CM

## 2023-06-04 DIAGNOSIS — F1420 Cocaine dependence, uncomplicated: Secondary | ICD-10-CM

## 2023-06-04 DIAGNOSIS — F1994 Other psychoactive substance use, unspecified with psychoactive substance-induced mood disorder: Secondary | ICD-10-CM | POA: Diagnosis not present

## 2023-06-04 DIAGNOSIS — F1020 Alcohol dependence, uncomplicated: Secondary | ICD-10-CM | POA: Diagnosis not present

## 2023-06-04 NOTE — Progress Notes (Signed)
Daily Group Progress Note   Program: CD IOP     Group Time: 9:00  am-12 pm      Type of Therapy: Process and Psychoeducational    Topic: The therapists check in with group members, assess for SI/HI/psychosis and overall level of functioning. The therapists inquire about sobriety date and number of community support meetings attended since last session.    The therapist discusses the following issues today:  Addiction as a brain disease, versus on of moral character, how sitting in quiet is difficult when the mind is anxious and used to constant stimulation and how to begin to achieve a calmer mind through meditation as a way to distract from using, how abusive relationships perpetuate the disease of addiction, possible ways to survive living with a partner who is narcicisstic, the role of choice in addiction, discussed craving as "waves" that can be ridden out.     Summary: Fulton presents today rating both his depression as a "0" and anxiety as being a "0".  Quavon describes his emotions today as "happy, optimistic, grateful and fee".  Jovante discusses how the disease of addition is baffling, cunning and powerful and he has to remain aware each moment of the day, lest he slip and drink.  Layn says he had the first craving yesterday that he has had since he has been sober this time.  He discusses how he deal with it by staying in his bed, eating his food and then falling asleep.   Progress Towards Goals: [name] reports no change in his sobriety date.    UDS collected: No Results: No   AA/NA attended?:  Yes  Sponsor?:  Yes  Remigio Eisenmenger, MS, LMFT, LCAS [address], [place], [place], [facility], LCAS  06/04/2023

## 2023-06-07 ENCOUNTER — Ambulatory Visit (INDEPENDENT_AMBULATORY_CARE_PROVIDER_SITE_OTHER): Payer: MEDICAID | Admitting: Licensed Clinical Social Worker

## 2023-06-07 DIAGNOSIS — F17200 Nicotine dependence, unspecified, uncomplicated: Secondary | ICD-10-CM

## 2023-06-07 DIAGNOSIS — F1994 Other psychoactive substance use, unspecified with psychoactive substance-induced mood disorder: Secondary | ICD-10-CM

## 2023-06-07 DIAGNOSIS — F1020 Alcohol dependence, uncomplicated: Secondary | ICD-10-CM

## 2023-06-07 DIAGNOSIS — F1420 Cocaine dependence, uncomplicated: Secondary | ICD-10-CM

## 2023-06-07 NOTE — Progress Notes (Signed)
Daily Group Progress Note   Program: CD IOP     Group Time: 9:00  am-12 pm      Type of Therapy: Process and Psychoeducational    Topic: The therapists check in with group members, assess for SI/HI/psychosis and overall level of functioning. The therapists inquire about sobriety date and number of community support meetings attended since last session.    The therapists facilitate group discussions about the reason that people lie about their using noting that some people lie as they are not motivated to stop using and want to keep people off their backs while others lie as it is part of their using ritual as lying accentuates the high. The therapists discuss the importance of honesty in recovery. Additionally, the therapists emphasize the importance of not wrestling with triggers in the form of people, places, and things given their impairment in their prefrontal cortexes. The therapists educate group members on sober living options such as [employer]    Summary: [name] presents today rating both his depression and anxiety as a 0.  Scribes his mood as being "happy," "optimistic," "grateful," and "free."  He reports no change in his sobriety date and says that he wishes that the group member who told him to just get a sponsor was in group today as [name] took his advice and got a Marketing executive realizing that he was waiting for the right time. [name] says that he eventually asked himself, "who knows when that time will come?" [name] concludes that "recovery is about action."   [name] talks about a situation involving a friend who he fears might of passed away as he has not heard from her in a while.  He says that this situation is weighing him down admitting that he does not want to know if she has passed away as he has already experienced a lot of deaths in the short period of time.  He asks about his positive UDS saying that he was told by his doctor that the Oxazepam is from when he was in detox. He says  that he has a prescription for Tramadol which he seldom takes noting that he took one recently but had not taken one in six months before this. After being informed that he cannot take this medication while in IOP, he agrees to not take it and will talk to his doctor about other options for his orthopedic pain such as Meloxicam.    Progress Towards Goals: [name] reports no change in his sobriety date.    UDS collected: Yes Results: Yes, positive for Oxazepam and Tramadol   AA/NA attended?:  Yes  Sponsor?:  Yes   Alene Mires, MA, LCSW, [facility], LCAS Remigio Eisenmenger, MS, LMFT, LCAS 06/07/2023

## 2023-06-09 ENCOUNTER — Ambulatory Visit (INDEPENDENT_AMBULATORY_CARE_PROVIDER_SITE_OTHER): Payer: Medicaid Other | Admitting: Licensed Clinical Social Worker

## 2023-06-09 DIAGNOSIS — F17200 Nicotine dependence, unspecified, uncomplicated: Secondary | ICD-10-CM

## 2023-06-09 DIAGNOSIS — F1994 Other psychoactive substance use, unspecified with psychoactive substance-induced mood disorder: Secondary | ICD-10-CM

## 2023-06-09 DIAGNOSIS — F1020 Alcohol dependence, uncomplicated: Secondary | ICD-10-CM

## 2023-06-09 DIAGNOSIS — F1420 Cocaine dependence, uncomplicated: Secondary | ICD-10-CM

## 2023-06-09 NOTE — Progress Notes (Signed)
Daily Group Progress Note   Program: CD IOP     Group Time: 9 am-12 pm      Type of Therapy: Process and Psychoeducational    Topic: The therapists check in with group members, assess for SI/HI/psychosis and overall level of functioning. The therapists inquire about sobriety date and number of community support meetings attended since last session.    The therapists encourage attending 12-step meetings informing group members that people in AA or NA can sometimes be good resources in terms of employment and/or housing resources.  The therapists focus primarily on the topics of honesty and its importance in recovery in addition to the reason that AA, NA, and almost all professionally based substance use treatment programs are abstinence based programs.  The therapists suggest that many people in recovery are unable to explain how addiction is a disease so it is not surprising that family members who do not have the disease of addiction would lack an understanding of this.  The therapists explained the importance of family involvement in treatment whenever possible so that family members can be educated about this and learn how to better support their loved ones who are in recovery.   Summary: [name] presents today describing his mood as being "happy," "optimistic," "grateful," and "free."  He says that he is still sober and thanks the therapists and other group members admitting that he cannot believe that he is sober.  He says that he was driving recently and had a thought that he could possibly make New Year's his new sobriety date; however, he did not act on this thought but realizes that he needs to talk about these thoughts when he has them.  He said were to relapse that there is no guarantee that he would be able to find his way back again.  [name] shares a story with group of how he went to the doctor in the past and was told that he might have a cancerous tumor and left the appointment  immediately drinking alcohol and buying some cigarettes.  This leads to a discussion of how hopelessness can serve as an excuse to drink or use in addition to the self-destructive aspect of addiction one 1 does not like him or herself.  [name] says that he is going to start doing some step work with his new sponsor.  He is very active in today's group discussions providing good feedback to other members one of whom in particular is in the precontemplation stage of change.  Progress Towards Goals: Tenzin reports no change in his sobriety date.  UDS collected: No results: No  AA/NA attended?:  Yes  Sponsor?:  Yes   Alene Mires, MA, LCSW, [facility], LCAS Remigio Eisenmenger, MS, LMFT, LCAS 06/09/2023

## 2023-06-11 ENCOUNTER — Telehealth: Payer: Self-pay

## 2023-06-11 ENCOUNTER — Ambulatory Visit (INDEPENDENT_AMBULATORY_CARE_PROVIDER_SITE_OTHER): Payer: MEDICAID

## 2023-06-11 ENCOUNTER — Encounter: Payer: Self-pay | Admitting: Nurse Practitioner

## 2023-06-11 ENCOUNTER — Ambulatory Visit (INDEPENDENT_AMBULATORY_CARE_PROVIDER_SITE_OTHER): Payer: MEDICAID | Admitting: Nurse Practitioner

## 2023-06-11 VITALS — BP 124/85 | HR 65 | Temp 97.0°F | Wt 195.6 lb

## 2023-06-11 DIAGNOSIS — F1420 Cocaine dependence, uncomplicated: Secondary | ICD-10-CM

## 2023-06-11 DIAGNOSIS — G8929 Other chronic pain: Secondary | ICD-10-CM | POA: Diagnosis not present

## 2023-06-11 DIAGNOSIS — F1994 Other psychoactive substance use, unspecified with psychoactive substance-induced mood disorder: Secondary | ICD-10-CM | POA: Diagnosis not present

## 2023-06-11 DIAGNOSIS — F1020 Alcohol dependence, uncomplicated: Secondary | ICD-10-CM | POA: Diagnosis not present

## 2023-06-11 DIAGNOSIS — M25511 Pain in right shoulder: Secondary | ICD-10-CM | POA: Diagnosis not present

## 2023-06-11 MED ORDER — KETOROLAC TROMETHAMINE 60 MG/2ML IM SOLN
60.0000 mg | Freq: Once | INTRAMUSCULAR | Status: AC
  Administered 2023-06-11: 60 mg via INTRAMUSCULAR

## 2023-06-11 NOTE — Progress Notes (Signed)
Subjective   Patient ID: [name], male    DOB: 06/07/1961, 62 y.o.   MRN: [id number]  Chief Complaint  Patient presents with   Shoulder Pain    Referring provider: Ivonne Andrew, NP  MACALEB SWINEA is a 62 y.o. male with Past Medical History: No date: Alcohol dependence ([name]) No date: Allergies No date: Arthritis No date: Chest pain No date: Chronic lower back pain No date: Chronic pain of right wrist No date: Cocaine abuse ([name])     Comment:  per note on 01/18/23 No date: Coronary artery disease     Comment:  a. Multiple prior caths/PCI. Cath 2013 with possible               spasm of RCA, 70% ISR of mid LCx with subsequent DES to               mLCx and prox LCX. b. H/o microvascular angina. c.               Recurrent angina 08/2014 - s/p PTCA/DES to prox Cx,               PTCA/CBA to OM1.  c. LHC 06/10/15 with patent stents and               some ISR in LCX and OM-1 that was not flow limiting -->               Rx  No date: Dyslipidemia     Comment:  a. Intolerant to many statins except tolerating Livalo. No date: GERD (gastroesophageal reflux disease) 10/25/2018: H/O cardiac catheterization No date: Heart attack [facility]) No date: Hypertension ~ 2010: Myocardial infarction ([name]) No date: Renal mass 12/15/2011: S/P angioplasty with stent, DES, to proximal and mid LCX  12/15/11 04/2021: S/P foot surgery, right No date: Shoulder pain No date: Stroke [facility])     Comment:  pt. reports had a stroke around time of MI 2010 No date: Type II diabetes mellitus ([name]) No date: Unstable angina ([name])   HPI  Patient presents today for an acute visit for right shoulder pain.  Patient has been seen in the emergency room for this and was given muscle relaxer and prednisone.  He states that this did not help.  We will check x-ray today.  We will place a referral to Ortho.  Patient does have decreased rates of motion to right shoulder. Denies f/c/s, n/v/d, hemoptysis, PND, leg  swelling Denies chest pain or edema     Allergies  Allergen Reactions   Bee Venom Anaphylaxis, Hives, Itching and Other (See Comments)    Red eyes   Gadolinium Hives, Itching, Swelling and Other (See Comments)    Swelling of eyes after receiving MR contrast. 13 hr prep recommended. Pt developed large hive and swelling under right eye after contrast, given 50mg  iv benadryl, pt will need premed before gadolinium//lh   Shellfish Allergy Anaphylaxis, Hives, Itching and Other (See Comments)    Red eyes   Statins Other (See Comments)    Myalgias. Tolerating livalo. Pain    Testosterone Cypionate Other (See Comments)    Testerone Injection --Increased breast tissue     Immunization History  Administered Date(s) Administered   Influenza,inj,Quad PF,6+ Mos 07/28/2017, 04/01/2018, 03/05/2021   MODERNA COVID-19 SARS-COV-2 PEDS BIVALENT BOOSTER 63yr-49yr 12/09/2019, 01/10/2020   Moderna SARS-COV2 Booster Vaccination 08/13/2020   Pneumococcal Polysaccharide-23 07/28/2017, 03/05/2021   Pneumococcal-Unspecified 06/29/2009   Tdap 02/08/2020   Zoster Recombinant(Shingrix)  01/11/2013    Tobacco History: Social History   Tobacco Use  Smoking Status Former   Current packs/day: 0.00   Average packs/day: 1 pack/day for 10.0 years (10.0 ttl pk-yrs)   Types: Cigarettes   Start date: 10/06/2008   Quit date: 10/07/2018   Years since quitting: 4.6  Smokeless Tobacco Never   Counseling given: Not Answered   Outpatient Encounter Medications as of 06/11/2023  Medication Sig   ACCU-CHEK GUIDE test strip USE 1 STRIP TO CHECK GLUCOSE THREE TIMES DAILY AS DIRECTED   amLODipine (NORVASC) 10 MG tablet Take 1 tablet (10 mg total) by mouth daily.   aspirin EC 81 MG tablet Take by mouth.   Cholecalciferol 25 MCG (1000 UT) tablet Take 1,000 Units by mouth daily.   clopidogrel (PLAVIX) 75 MG tablet Take 1 tablet (75 mg total) by mouth daily.   cyclobenzaprine (FLEXERIL) 10 MG tablet Take 1 tablet by mouth  3 times daily as needed for muscle spasm. Warning: May cause drowsiness.   ezetimibe (ZETIA) 10 MG tablet Take 1 tablet (10 mg total) by mouth daily.   LIVALO 4 MG TABS Take 1 tablet by mouth once daily   metFORMIN (GLUCOPHAGE-XR) 500 MG 24 hr tablet Take 1 tablet (500 mg total) by mouth daily with breakfast.   metoprolol tartrate (LOPRESSOR) 100 MG tablet Take 1 tablet (100 mg total) by mouth 2 (two) times daily. NEED OV.   Multiple Vitamin (MULTIVITAMIN WITH MINERALS) TABS tablet Take 1 tablet by mouth daily.   Polyethyl Glycol-Propyl Glycol (SYSTANE) 0.4-0.3 % GEL ophthalmic gel Place 1 Application into both eyes daily as needed (For dry eyes).   predniSONE (DELTASONE) 20 MG tablet Take 2 tablets (40 mg total) by mouth daily.   EPINEPHrine 0.3 mg/0.3 mL IJ SOAJ injection Inject 0.3 mg into the muscle as needed for anaphylaxis. (Patient not taking: Reported on 03/09/2023)   nitroGLYCERIN (NITROSTAT) 0.4 MG SL tablet Place 1 tablet (0.4 mg total) under the tongue every 5 (five) minutes x 3 doses as needed for chest pain. (Patient not taking: Reported on 03/09/2023)   [DISCONTINUED] sildenafil (VIAGRA) 100 MG tablet Take 100 mg by mouth daily as needed for erectile dysfunction. (Patient not taking: Reported on 03/09/2023)   Facility-Administered Encounter Medications as of 06/11/2023  Medication   ketorolac (TORADOL) injection 60 mg    Review of Systems  Review of Systems  Constitutional: Negative.   HENT: Negative.    Cardiovascular: Negative.   Gastrointestinal: Negative.   Musculoskeletal:        Right shoulder pain  Allergic/Immunologic: Negative.   Neurological: Negative.   Psychiatric/Behavioral: Negative.       Objective:   BP 124/85   Pulse 65   Temp (!) 97 F (36.1 C)   Wt 195 lb 9.6 oz (88.7 kg)   SpO2 100%   BMI 29.74 kg/m   Wt Readings from Last 5 Encounters:  06/11/23 195 lb 9.6 oz (88.7 kg)  05/20/23 195 lb (88.5 kg)  03/09/23 190 lb 3.2 oz (86.3 kg)  02/24/23  191 lb (86.6 kg)  02/15/23 194 lb 6.4 oz (88.2 kg)     Physical Exam Vitals and nursing note reviewed.  Constitutional:      General: He is not in acute distress.    Appearance: He is well-developed.  Cardiovascular:     Rate and Rhythm: Normal rate and regular rhythm.  Pulmonary:     Effort: Pulmonary effort is normal.     Breath sounds: Normal breath  sounds.  Musculoskeletal:     Right shoulder: Tenderness present. Decreased range of motion. Decreased strength.  Skin:    General: Skin is warm and dry.  Neurological:     Mental Status: He is alert and oriented to person, place, and time.       Assessment & Plan:   Chronic right shoulder pain -     DG Shoulder Right -     Ambulatory referral to Orthopedics -     Ketorolac Tromethamine     Return if symptoms worsen or fail to improve.   Ivonne Andrew, NP 06/11/2023

## 2023-06-11 NOTE — Telephone Encounter (Unsigned)
Copied from CRM [id number]. Topic: General - Other >> Jun 11, 2023 10:14 AM Prudencio Pair wrote: Reason for CRM: Patient returning call. Advised him of the note that was left by [place]. Pt states the [place] location will not work for him because he does not have a car. He rides the bus. He states it has to be in [place]. Pt states he has an appt today and will speak with the nurse/provider on this issue when he comes.  Noted -CB

## 2023-06-11 NOTE — Patient Instructions (Signed)
1. Chronic right shoulder pain (Primary)  - DG Shoulder Right - AMB referral to orthopedics - ketorolac (TORADOL) injection 60 mg  Follow up:  Follow up as needed

## 2023-06-11 NOTE — Progress Notes (Addendum)
Daily Group Progress Note   Program: [facility] IOP     Group Time: 9:00  am-12 pm      Type of Therapy: Process and Psychoeducational    Topic: The therapists check in with group members, assess for SI/HI/psychosis and overall level of functioning. The therapists inquire about sobriety date and number of community support meetings attended since last session.   The therapists discuss the following issues today: Utilized [employer]: "Be Smart, Not Strong" to discuss how addiction is a medical disease, rather than a character disorder or moral failure. Had group complete the rating scale on their Recovery IQ which list  ways to be smart in recovery. The therapist discuss addiction as a medical disease, specifically explaining the process of the brain healing in recovery which usually takes around a year. Discussed components of self care that lead to better health. Therapists discuss how examining incongruence's can reveal one's true intent and challenge.  For example, if one says they want to be sober and yet keeps a bottle of alcohol in their house and does not want to rid themselves of it, this can indicate their current challenge. Therapists facilitate discussion on the issues of "What is the function of your addiction/disease?     Summary: Zaide presents today rating both his depression as a "0" and anxiety as being a "0".  He reports the same sobriety date. He says he is working with his sponsor. He noted he did not pick up a 30 day chip when it was time, as he does not care about that. He says he takes one day at a time. Therapist points out that often chips are picked up to remind other's that sobriety can happen. Rashawn identifies his emotions as Happy, optimistic and grateful".  Anael discusses how he notices the degree of change in his life since his sobriety date.  He says he is loving the change he sees. He notes he no longer watches T.V. and watches things that are funny on Youtube so  he can enjoy the videos.  Keyvion says he used to be addicted to a reality T.V. show. 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    [name] shares his score on his recovery IQ is 11. Therapist asks him about that score and he looks at the statements on the [employer] and says he thought he was supposed to answer that as if he was in active addiction.  When responding to the question, What is the function of you disease?, Issaih says he drank to celebrate because he wanted to get high.   Progress Towards Goals: [name] reports no change in his sobriety date.    UDS collected: No Results: Negative   AA/NA attended?:  Yes  Sponsor?:  Yes  Remigio Eisenmenger, MS, LMFT, LCAS [address], [place], [place], [facility], LCAS  06/11/2023

## 2023-06-14 ENCOUNTER — Ambulatory Visit (INDEPENDENT_AMBULATORY_CARE_PROVIDER_SITE_OTHER): Payer: MEDICAID | Admitting: Licensed Clinical Social Worker

## 2023-06-14 DIAGNOSIS — F17200 Nicotine dependence, unspecified, uncomplicated: Secondary | ICD-10-CM

## 2023-06-14 DIAGNOSIS — F1994 Other psychoactive substance use, unspecified with psychoactive substance-induced mood disorder: Secondary | ICD-10-CM

## 2023-06-14 DIAGNOSIS — F1020 Alcohol dependence, uncomplicated: Secondary | ICD-10-CM | POA: Diagnosis not present

## 2023-06-14 DIAGNOSIS — F1420 Cocaine dependence, uncomplicated: Secondary | ICD-10-CM

## 2023-06-14 NOTE — Progress Notes (Signed)
Daily Group Progress Note   Program: [facility] IOP     Group Time: 9 am-12 pm      Type of Therapy: Process and Psychoeducational    Topic: The therapists check in with group members, assess for SI/HI/psychosis and overall level of functioning. The therapists inquire about sobriety date and number of community support meetings attended since last session.   Therapists share the [employer] Just for today reading entitled "Where There is Smoke" and facilitated discussion from the reading.  The reading involved the issue to complacency and compared it to smoke which is like seeing smoke that clouds things and makes them less clear.  Example would be to clearly see where one is in the steps toward recover. Therapists discuss momentum and prompt discussion on why people lose momentum in recovery. Therapists discuss the issues of how encouragement and accountability can aid one in remaining on the course of recovery. Therapists discuss developing goals and how to break the goal down from "the big picture" to smaller more achievable steps.  This could also help with feeling paralyzed by procrastination. Therapists discuss the role of use and the challenge of learning new behaviors without use. For example, people often use alcohol or drugs in socialization in this society and the challenge to build other sober activities where substances are not present.  Therapists also discuss developing healthy self rewards when making steps toward achievement of a goal.   Summary: [name] presents today rating both is depression and anxiety as a 0 and describing his mood as still being "happy," "optimistic," "grateful," and "free."  [name] is very active in today's group discussions. It is noted that his initial authorization is coming due with [name] saying that he wants to continue in group as he still finds it hard to be committed to recovery. Additionally, he says that although he now has a Marketing executive that he continues to grapple with  trust issues. [name] believes that the main reason that people lose momentum and drop out of recovery has to do with Step One. He talks about having implemented rewards into his life such as allowing himself to eat steak twice a month.   When asked about his biggest takeaway at the conclusion of group, [name] says that his is the need to allow others to be where they are and "respect the process" as he sometimes wants others to "just get it" or to tell them to "Just get a Sponsor."  Progress Towards Goals: [name] reports no change in his sobriety date.  UDS collected: Yes results: No  AA/NA attended?:  Yes  Sponsor?:  Yes   Alene Mires, MA, LCSW, [facility], LCAS Remigio Eisenmenger, MS, LMFT, LCAS 06/14/2023

## 2023-06-15 ENCOUNTER — Telehealth (HOSPITAL_COMMUNITY): Payer: Self-pay

## 2023-06-15 NOTE — Telephone Encounter (Signed)
Dmetrius calls this therapist this a.m. and reports he has an appointment at 8:15 on 06/16/23. Steven says he will come to [facility]IOP after his appointment.  Remigio Eisenmenger, MS, LMFT, LCAS 06/15/23

## 2023-06-16 ENCOUNTER — Encounter (HOSPITAL_COMMUNITY): Payer: Self-pay

## 2023-06-16 ENCOUNTER — Ambulatory Visit (INDEPENDENT_AMBULATORY_CARE_PROVIDER_SITE_OTHER): Payer: MEDICAID | Admitting: Physician Assistant

## 2023-06-16 ENCOUNTER — Encounter: Payer: Self-pay | Admitting: Physician Assistant

## 2023-06-16 ENCOUNTER — Ambulatory Visit (HOSPITAL_COMMUNITY): Payer: MEDICAID | Admitting: Psychiatry

## 2023-06-16 ENCOUNTER — Ambulatory Visit (HOSPITAL_COMMUNITY): Payer: MEDICAID | Admitting: Licensed Clinical Social Worker

## 2023-06-16 ENCOUNTER — Other Ambulatory Visit (INDEPENDENT_AMBULATORY_CARE_PROVIDER_SITE_OTHER): Payer: MEDICAID

## 2023-06-16 DIAGNOSIS — M25511 Pain in right shoulder: Secondary | ICD-10-CM | POA: Diagnosis not present

## 2023-06-16 DIAGNOSIS — F1994 Other psychoactive substance use, unspecified with psychoactive substance-induced mood disorder: Secondary | ICD-10-CM

## 2023-06-16 DIAGNOSIS — D4101 Neoplasm of uncertain behavior of right kidney: Secondary | ICD-10-CM

## 2023-06-16 DIAGNOSIS — E781 Pure hyperglyceridemia: Secondary | ICD-10-CM

## 2023-06-16 DIAGNOSIS — F17200 Nicotine dependence, unspecified, uncomplicated: Secondary | ICD-10-CM

## 2023-06-16 DIAGNOSIS — F1020 Alcohol dependence, uncomplicated: Secondary | ICD-10-CM

## 2023-06-16 DIAGNOSIS — F1420 Cocaine dependence, uncomplicated: Secondary | ICD-10-CM

## 2023-06-16 DIAGNOSIS — R7401 Elevation of levels of liver transaminase levels: Secondary | ICD-10-CM

## 2023-06-16 DIAGNOSIS — E1159 Type 2 diabetes mellitus with other circulatory complications: Secondary | ICD-10-CM

## 2023-06-16 DIAGNOSIS — D4121 Neoplasm of uncertain behavior of right ureter: Secondary | ICD-10-CM

## 2023-06-16 DIAGNOSIS — I252 Old myocardial infarction: Secondary | ICD-10-CM

## 2023-06-16 DIAGNOSIS — E349 Endocrine disorder, unspecified: Secondary | ICD-10-CM

## 2023-06-16 DIAGNOSIS — I2510 Atherosclerotic heart disease of native coronary artery without angina pectoris: Secondary | ICD-10-CM

## 2023-06-16 DIAGNOSIS — I10 Essential (primary) hypertension: Secondary | ICD-10-CM

## 2023-06-16 MED ORDER — METHYLPREDNISOLONE ACETATE 40 MG/ML IJ SUSP
40.0000 mg | INTRAMUSCULAR | Status: AC | PRN
  Administered 2023-06-16: 40 mg via INTRA_ARTICULAR

## 2023-06-16 MED ORDER — LIDOCAINE HCL 1 % IJ SOLN
5.0000 mL | INTRAMUSCULAR | Status: AC | PRN
  Administered 2023-06-16: 5 mL

## 2023-06-16 NOTE — Progress Notes (Signed)
Daily Group Progress Note   Program: [facility] IOP     Group Time: 9 am-12 pm      Type of Therapy: Process and Psychoeducational    Topic: The therapists check in with group members, assess for SI/HI/psychosis and overall level of functioning. The therapists inquire about sobriety date and number of community support meetings attended since last session.   The therapists explain systems theory as it relates to interpersonal problems illustrating how a single person can impact the entire system simply by changing his or her own behavior. The therapists explain to members the stages of change discussing the reason that many people enter treatment in the Precontemplation Stage of Change unwilling to admit that their lives have become unmanageable. The therapists point out that clinicians must not only explore the reasons that people want to stop using substances but must also understand the reasons that they do not. The therapists discuss stigma as it relates to the disease of addiction and how mistaking addiction for a weakness or character defect makes people committed to not admitting to being an "alcoholic" or an "addict." The therapists focus on the topic of honesty and avoiding self-deception.      Summary: [name] presents today rating both is depression and anxiety as a 0 and describing his mood as still being "happy," "optimistic," "grateful," and "free."  He says that he is still sober. He says that his Sponsor wants him reading the first 164 pages of the Big Book in advance of starting steps asking for feedback on what these pages address.   [name] shared again about his initial hesitancy about getting a Sponsor and what led to this breakthrough. He is very active in the group discussion concerning why people often refuse to admit to being an alcoholic or addict.  At the end of group, he says that his biggest takeaway is how much he appreciates his therapists.   Progress Towards Goals: [name]  reports no change in his sobriety date. His UDS on 06/07/23 was his first negative UDS without detox benzos or Tramadol.   UDS collected: No results: No  AA/NA attended?:  Yes  Sponsor?:  Yes   Alene Mires, MA, LCSW, [facility], LCAS Remigio Eisenmenger, MS, LMFT, LCAS 06/16/2023

## 2023-06-16 NOTE — Progress Notes (Signed)
Office Visit Note   Patient: [name]           Date of Birth: [birth year]           MRN: [id number] Visit Date: 06/16/2023              Requested by: Ivonne Andrew, NP [id number] [address]. [address] [place],  [place] [address] PCP: Ivonne Andrew, NP   Assessment & Plan: Visit Diagnoses:  1. Right shoulder pain, unspecified chronicity     Plan: Pleasant 62 year old gentleman with a 1 month history of right shoulder pain.  Denies any injury but does lay in bed at night reaching his right shoulder over his body on his side to work on a computer.  He has tried ibuprofen and Flexeril without relief he denies any neck symptoms as he has had neck difficulties but this is different.  Exam and x-rays favor impingement syndrome.  Discussed this with him we will go forward with an injection today he declined trying physical therapy  Follow-Up Instructions: As needed  Orders:  No orders of the defined types were placed in this encounter.  No orders of the defined types were placed in this encounter.     Procedures: Large Joint Inj: R subacromial bursa on 06/16/2023 8:33 AM Indications: diagnostic evaluation and pain Details: 25 G 1.5 in needle, posterior approach  Arthrogram: No  Medications: 5 mL lidocaine 1 %; 40 mg methylPREDNISolone acetate 40 MG/ML Outcome: tolerated well, no immediate complications Procedure, treatment alternatives, risks and benefits explained, specific risks discussed. Consent was given by the patient.     Clinical Data: No additional findings.   Subjective: No chief complaint on file.   HPI pleasant 62 year old right-hand-dominant gentleman with a 1 month history of right shoulder pain.  Denies any injuries but does lay on his side at night with his right shoulder over his body working on a computer.  Has tried ibuprofen without significant relief  Review of Systems  All other systems reviewed and are negative.    Objective: Vital Signs: There  were no vitals taken for this visit.  Physical Exam Constitutional:      Appearance: Normal appearance.  Skin:    General: Skin is warm and dry.  Neurological:     General: No focal deficit present.     Mental Status: He is alert and oriented to person, place, and time.    Ortho Exam Right shoulder he is neurovascularly intact no reproduction of symptoms or cervical radiculopathy findings.  His strength is intact with resisted abduction external and internal rotation he has some pain with external rotation more pain with impingement findings positive empty can test positive speeds test Specialty Comments:  No specialty comments available.  Imaging: No results found.   PMFS History: Patient Active Problem List   Diagnosis Date Noted   Pain in right shoulder 06/16/2023   Anxiety disorder, unspecified 02/16/2023   Alcohol use disorder 02/16/2023   Sleep disorder, unspecified 02/16/2023   Alcohol abuse 02/15/2023   Alcohol dependence ([name]) 05/16/2022   Low energy 03/06/2022   Moderate alcohol dependence in early remission ([name]) 02/24/2022   Suicidal ideation 07/07/2021   Generalized anxiety disorder 07/07/2021   Other allergic rhinitis 09/09/2020   Adverse reaction to food, subsequent encounter 09/09/2020   History of bee sting allergy 09/09/2020   Alcohol dependence with alcohol-induced mood disorder ([name]) 03/19/2020   MDD (major depressive disorder), recurrent episode, severe ([name]) 03/18/2020   History  of substance abuse ([name]) 01/04/2020   Severe 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 features ([name]) 12/10/2019   Tinea cruris 07/06/2019   Hemoglobin A1c less than 7.0% 07/06/2019   Muscle strain of wrist 04/05/2019   Pain in both wrists 04/05/2019   Anxiety 04/05/2019   Prediabetes 04/05/2019   MDD (major depressive disorder) 01/12/2019   Chest pain 10/25/2018   MDD (major depressive disorder), recurrent severe, without psychosis ([name]) 08/15/2018   Influenza A  07/17/2017   Lactic acidosis    Cough    Nausea and vomiting    Severe episode of recurrent major depressive disorder, without psychotic features ([name])    MDD (major depressive disorder), single episode, severe with psychotic features ([name]) 06/27/2017   MDD (major depressive disorder), severe ([name]) 06/23/2017   Essential hypertension 01/13/2016   Unstable angina ([name])    ED (erectile dysfunction) 02/15/2014   Atypical chest pain 12/14/2011   Allergy to contrast media (used for diagnostic x-rays) 05/12/2011   Dyslipidemia, goal LDL below 70 05/09/2011   DM2 (diabetes mellitus, type 2) ([name]) 07/10/2010   CAD S/P percutaneous coronary angioplasty 07/10/2010   Past Medical History:  Diagnosis Date   Alcohol dependence ([name])    Allergies    Arthritis    Chest pain    Chronic lower back pain    Chronic pain of right wrist    Cocaine abuse ([name])    per note on 01/18/23   Coronary artery disease    a. Multiple prior caths/PCI. Cath 2013 with possible spasm of RCA, 70% ISR of mid LCx with subsequent DES to mLCx and prox LCX. b. H/o microvascular angina. c. Recurrent angina 08/2014 - s/p PTCA/DES to prox Cx, PTCA/CBA to OM1.  c. LHC 06/10/15 with patent stents and some ISR in LCX and OM-1 that was not flow limiting --> Rx    Dyslipidemia    a. Intolerant to many statins except tolerating Livalo.   GERD (gastroesophageal reflux disease)    H/O cardiac catheterization 10/25/2018   Heart attack [facility])    Hypertension    Myocardial infarction [facility]) ~ 2010   Renal mass    S/P angioplasty with stent, DES, to proximal and mid LCX 12/15/11 12/15/2011   S/P foot surgery, right 04/2021   Shoulder pain    Stroke ([name])    pt. reports had a stroke around time of MI 2010   Type II diabetes mellitus ([name])    Unstable angina ([name])     Family History  Problem Relation Age of Onset   Leukemia Mother    Prostate cancer Father    Cancer Brother    Coronary artery disease Paternal Grandmother     Cancer Paternal Grandfather    Migraines Neg Hx    Headache Neg Hx    Colon cancer Neg Hx    Esophageal cancer Neg Hx    Rectal cancer Neg Hx    Stomach cancer Neg Hx     Past Surgical History:  Procedure Laterality Date   CARDIAC CATHETERIZATION  06/15/2002   LAD with prox 40% stenosis, norma L main, Cfx with 25% lesion, RCA with long mid 25% stenosis (Dr. Daiva Nakayama)   CARDIAC CATHETERIZATION  04/01/2010   normal L main, LAD wit mild stenosis, L Cfx with 70% in-stent restenosis, RCA with 70% in-stent restenosis, LVEF >60% (Dr. K. Italy Hilty) - cutting ballon arthrectomy to RCA & Cfx (Dr. Langston Reusing)   CARDIAC CATHETERIZATION  08/25/2010   preserved global LV contractility; multivessel  CAD, diffuse 90-95% in-stent restenosis in prox placed Cfx stent - cutting balloon arthrectomy in Cfx with multiple dilatations 90-95% to 0% stenosis (Dr. Bishop Limbo)   CARDIAC CATHETERIZATION  01/26/2011   PCI & stenting of aggresive in-stent restenosis within previously stented AV groove Cfx with 3.0x46mm Taxus DES (previous stents were Promus) (Dr. Erlene Quan)   CARDIAC CATHETERIZATION  05/11/2011   preserved LV function, 40% mid LAD stenosis, 30-40% narrowing proximal to stented semgnet of prox Cfx, patent mid RCA stent with smooth 20% narrowing in distal RCA (Dr. Bishop Limbo)   CARDIAC CATHETERIZATION  12/15/2011   PCI & stenting of proximal & mid Cfx with DES - 3.0x9mm in proximal, 3.0x39mm in mid (Dr. Erlene Quan)   CARDIAC CATHETERIZATION N/A 06/10/2015   Procedure: Left Heart Cath and Coronary Angiography;  Surgeon: Dolores Patty, MD;  Location: [facility] INVASIVE CV LAB;  Service: Cardiovascular;  Laterality: N/A;   CARDIAC CATHETERIZATION  10/25/2018   cardiometabolic testing  08/08/2012   good exercise effort, peak VO2 79% predicted with normal VO2 HR curves (mild deconditioning)   COLONOSCOPY  12/2012   diminutive hyperplastic sigmoid poyp so repeat routine 2024   CORONARY BALLOON ANGIOPLASTY N/A 10/25/2018    Procedure: CORONARY BALLOON ANGIOPLASTY;  Surgeon: Corky Crafts, MD;  Location: [facility] INVASIVE CV LAB;  Service: Cardiovascular;  Laterality: N/A;   CORONARY BALLOON ANGIOPLASTY N/A 09/29/2019   Procedure: CORONARY BALLOON ANGIOPLASTY;  Surgeon: Yvonne Kendall, MD;  Location: [facility] INVASIVE CV LAB;  Service: Cardiovascular;  Laterality: N/A;   CORONARY ULTRASOUND/IVUS N/A 09/29/2019   Procedure: Intravascular Ultrasound/IVUS;  Surgeon: Yvonne Kendall, MD;  Location: [facility] INVASIVE CV LAB;  Service: Cardiovascular;  Laterality: N/A;   EXCISIONAL HEMORRHOIDECTOMY  1984   LEFT HEART CATH AND CORONARY ANGIOGRAPHY N/A 10/25/2018   Procedure: LEFT HEART CATH AND CORONARY ANGIOGRAPHY;  Surgeon: Corky Crafts, MD;  Location: [facility] INVASIVE CV LAB;  Service: Cardiovascular;  Laterality: N/A;   LEFT HEART CATH AND CORONARY ANGIOGRAPHY N/A 09/29/2019   Procedure: LEFT HEART CATH AND CORONARY ANGIOGRAPHY;  Surgeon: Yvonne Kendall, MD;  Location: [facility] INVASIVE CV LAB;  Service: Cardiovascular;  Laterality: N/A;   LEFT HEART CATHETERIZATION WITH CORONARY ANGIOGRAM N/A 05/11/2011   Procedure: LEFT HEART CATHETERIZATION WITH CORONARY ANGIOGRAM;  Surgeon: Lennette Bihari, MD;  Location: [facility] CATH LAB;  Service: Cardiovascular;  Laterality: N/A;  Possible percutaneous coronary intervention, possible IVUS   LEFT HEART CATHETERIZATION WITH CORONARY ANGIOGRAM N/A 12/15/2011   Procedure: LEFT HEART CATHETERIZATION WITH CORONARY ANGIOGRAM;  Surgeon: Runell Gess, MD;  Location: [facility] CATH LAB;  Service: Cardiovascular;  Laterality: N/A;   LEFT HEART CATHETERIZATION WITH CORONARY ANGIOGRAM N/A 09/05/2014   Procedure: LEFT HEART CATHETERIZATION WITH CORONARY ANGIOGRAM;  Surgeon: Kathleene Hazel, MD;  Location: [facility] CATH LAB;  Service: Cardiovascular;  Laterality: N/A;   LIPOMA EXCISION     back of the head   NM MYOCAR PERF WALL MOTION  02/2012   lexiscan myoview; mild perfusion defect in mid inferolateral & basal  inferolateral region (infarct/scar); EF 52%, abnormal but ow risk scan   PERCUTANEOUS CORONARY STENT INTERVENTION (PCI-S)  09/05/2014   Procedure: PERCUTANEOUS CORONARY STENT INTERVENTION (PCI-S);  Surgeon: Kathleene Hazel, MD;  Location: [facility] CATH LAB;  Service: Cardiovascular;;   TONSILLECTOMY     Social History   Occupational History   Not on file  Tobacco Use   Smoking status: Former    Current packs/day: 0.00    Average packs/day: 1 pack/day for 10.0  years (10.0 ttl pk-yrs)    Types: Cigarettes    Start date: 10/06/2008    Quit date: 10/07/2018    Years since quitting: 4.6   Smokeless tobacco: Never  Vaping Use   Vaping status: Never Used  Substance and Sexual Activity   Alcohol use: Not Currently    Comment: 05/28/2023-stopped drinking 22 days ago.   Drug use: Not Currently    Types: Cocaine   Sexual activity: Not Currently

## 2023-06-16 NOTE — Progress Notes (Signed)
Health Follow-up Outpatient CDIOP Date: 06/16/2023  Admission Date:05/19/2023  Sobriety date:05/04/2023  Subjective: "I'm doing well"  HPI : CD IOP Provider FU Pt seen for FU 4 weeks after entry into CD IOP and 6 weeks since last use. He has not been on any psychiatric medications as he only experiences depression when he uses. He continues to feel free of depression and anxiety and is using 12 Step program along with Group therapy here to abstain.Denies cravings.  Counselor's report: Summary: [name] presents today rating both is depression and anxiety as a 0 and describing his mood as still being "happy," "optimistic," "grateful," and "free."  He says that he is still sober. He says that his Sponsor wants him reading the first 164 pages of the Big Book in advance of starting steps asking for feedback on what these pages address.   [name] shared again about his initial hesitancy about getting a Sponsor and what led to this breakthrough. He is very active in the group discussion concerning why people often refuse to admit to being an alcoholic or addict.  At the end of group, he says that his biggest takeaway is how much he appreciates his therapists.   Review of Systems: Psychiatric: Agitation: See Counselor report Hallucination: No Depressed Mood: see Counselor reports Insomnia: No Hypersomnia: No Altered Concentration: No Feels Worthless: No Grandiose Ideas: No Belief In Special Powers: No New/Increased Substance Abuse: No Compulsions: Denies craving  Neurologic: Headache: No Seizure: No Paresthesias: No  Current Medications: Your Medication List Accu-Chek Guide test strip Generic drug: glucose blood USE 1 STRIP TO CHECK GLUCOSE THREE TIMES DAILY AS DIRECTED  amLODipine 10 MG tablet Commonly known as: NORVASC Take 1 tablet (10 mg total) by mouth daily.  aspirin EC 81 MG tablet Take by mouth.  Cholecalciferol 25 MCG (1000 UT) tablet Take 1,000 Units by  mouth daily.  clopidogrel 75 MG tablet Commonly known as: PLAVIX Take 1 tablet (75 mg total) by mouth daily.  cyclobenzaprine 10 MG tablet Commonly known as: FLEXERIL Take 1 tablet by mouth 3 times daily as needed for muscle spasm. Warning: May cause drowsiness.  EPINEPHrine 0.3 mg/0.3 mL Soaj injection Commonly known as: EPI-PEN Inject 0.3 mg into the muscle as needed for anaphylaxis.  ezetimibe 10 MG tablet Commonly known as: ZETIA Take 1 tablet (10 mg total) by mouth daily.  Livalo 4 MG Tabs Generic drug: Pitavastatin Calcium Take 1 tablet by mouth once daily  metFORMIN 500 MG 24 hr tablet Commonly known as: GLUCOPHAGE-XR Take 1 tablet (500 mg total) by mouth daily with breakfast.  metoprolol tartrate 100 MG tablet Commonly known as: LOPRESSOR Take 1 tablet (100 mg total) by mouth 2 (two) times daily. NEED OV.  multivitamin with minerals Tabs tablet Take 1 tablet by mouth daily.  nitroGLYCERIN 0.4 MG SL tablet Commonly known as: NITROSTAT Place 1 tablet (0.4 mg total) under the tongue every 5 (five) minutes x 3 doses as needed for chest pain.  predniSONE 20 MG tablet Commonly known as: DELTASONE Take 2 tablets (40 mg total) by mouth daily.  Systane 0.4-0.3 % Gel ophthalmic gel Generic drug: Polyethyl Glycol-Propyl Glycol Place 1 Application into both eyes daily as needed (For dry eyes).    Mental Status Examination  Appearance:Casual Neat Alert: Yes Attention: good  Cooperative: Yes Eye Contact: Good Speech: Clear and coherent, rate WNL Psychomotor Activity: Normal Memory:Intact Concentration/Attention: Normal/intact Oriented: person, place, time/date and situation Mood: Euthymic Affect: Appropriate and Congruent Thought Processes and Associations:  Coherent and 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   MRN: [id number]

## 2023-06-17 ENCOUNTER — Ambulatory Visit (INDEPENDENT_AMBULATORY_CARE_PROVIDER_SITE_OTHER): Payer: MEDICAID | Admitting: Podiatry

## 2023-06-17 ENCOUNTER — Ambulatory Visit (INDEPENDENT_AMBULATORY_CARE_PROVIDER_SITE_OTHER): Payer: MEDICAID

## 2023-06-17 ENCOUNTER — Encounter: Payer: Self-pay | Admitting: Podiatry

## 2023-06-17 DIAGNOSIS — M216X1 Other acquired deformities of right foot: Secondary | ICD-10-CM | POA: Diagnosis not present

## 2023-06-17 DIAGNOSIS — M205X1 Other deformities of toe(s) (acquired), right foot: Secondary | ICD-10-CM

## 2023-06-17 DIAGNOSIS — M7751 Other enthesopathy of right foot: Secondary | ICD-10-CM

## 2023-06-17 DIAGNOSIS — Z9889 Other specified postprocedural states: Secondary | ICD-10-CM

## 2023-06-17 NOTE — Progress Notes (Signed)
Subjective:   Patient ID: [name], male   DOB: 62 y.o.   MRN: [id number]   HPI Patient states having quite a bit of discomfort in the right foot underneath the big toe joint and states that he has been increasing his activity and was doing a lot of running.  He is now walking and having less discomfort wants it checked   ROS      Objective:  Physical Exam  Neuro vascular status was found to be intact inflammation around the first MPJ right moderate reduction of motion but no crepitus of the joint and is actually done very well surgically with this.  Pain more plantar and     Assessment:  Laboratory capsulitis probably within the plantar first MPJ but low-grade and also due to activity H&P     Plan:  Stray reviewed discussed at great length and at this provide I have recommended wearing good rigid bottom shoes and being a little bit careful with his activities.  Patient will be seen back if symptoms persist but I do not see any issue with x-ray  X-rays indicate that there is nice joint space with no indication of spur formation reoccurrence of deformity

## 2023-06-18 ENCOUNTER — Ambulatory Visit (INDEPENDENT_AMBULATORY_CARE_PROVIDER_SITE_OTHER): Payer: MEDICAID

## 2023-06-18 DIAGNOSIS — F1020 Alcohol dependence, uncomplicated: Secondary | ICD-10-CM

## 2023-06-18 DIAGNOSIS — F1420 Cocaine dependence, uncomplicated: Secondary | ICD-10-CM

## 2023-06-18 NOTE — Progress Notes (Signed)
Daily Group Progress Note   Program: CD IOP     Group Time: 9:00  am-12 pm      Type of Therapy: Process and Psychoeducational    Topic: The therapists check in with group members, assess for SI/HI/psychosis and overall level of functioning. The therapists inquire about sobriety date and number of community support meetings attended since last session.   Therapists utilize the material in the [employer] on [place] and Recovery for the group which lists possible issues that could result in use during the holidays. Therapists prompt discussion among the group members regarding which issues may impact them during the holidays. Therapist also briefly discuss Post Acute Withdrawal symptoms and how these impact one's recovery.  Therapist discuss systems therapy and how to interrupt the process that continues the feedback loop of dysfunctional interactions.     Summary: Kayen presents today rating both his depression as a "0" and anxiety as being a "0".  He reports the same sobriety date and has a sponsor. Quanell identifies his emotions as "happy, optimistic, grateful and free".  In response to the Holiday and Recovery Handout, Sevak says that having extra free time during the holidays could be an issue for him, but he is making plans.  Keilon shares that an experience of relapse that happened 7 years ago, after 4 months of sobriety with unstructured time being the precipitant to his use.  He says it snowed that day and he was unable to go to work so he sat on the porch watching it snow and had a drink. He says he drank for the next 7 years.  Deyvon shares he will be attending AA meetings for the holidays.   Progress Towards Goals: [name] reports no change in his sobriety date.    UDS collected: No Results: Negative   AA/NA attended?:  Yes  Sponsor?:  Yes  Remigio Eisenmenger, MS, LMFT, LCAS [address], [place], [place], [employer], LCAS  06/18/2023

## 2023-06-21 ENCOUNTER — Ambulatory Visit (INDEPENDENT_AMBULATORY_CARE_PROVIDER_SITE_OTHER): Payer: MEDICAID

## 2023-06-21 DIAGNOSIS — F1994 Other psychoactive substance use, unspecified with psychoactive substance-induced mood disorder: Secondary | ICD-10-CM

## 2023-06-21 DIAGNOSIS — F1420 Cocaine dependence, uncomplicated: Secondary | ICD-10-CM | POA: Diagnosis not present

## 2023-06-21 DIAGNOSIS — F1020 Alcohol dependence, uncomplicated: Secondary | ICD-10-CM

## 2023-06-21 NOTE — Progress Notes (Signed)
Daily Group Progress Note   Program: CD IOP     Group Time: 9:00  am-12 pm      Type of Therapy: Process and Psychoeducational    Topic: The therapists check in with group members, assess for SI/HI/psychosis and overall level of functioning. The therapists inquire about sobriety date and number of community support meetings attended since last session.   Therapists introduce the two new group members and ask other group members to share a bit about themselves, including what brought them to this CD-IOP program. Therapists utilize the [employer] from the [employer]. Therapists ask group members to check the emotions/feelings that may be a trigger using.  Therapists facilitate discussion on group members responses.     Summary: [name] presents today rating both his depression as a "0" and anxiety as being a "1".  [name] identifies his emotions as "happy, optimistic, grateful and free".  [name] reports the same sobriety date.  [name] says he is working on steps with his sponsor.  [name] shares with the group that what brought him to CD-IOP was the last 7 years he out in active action, then went to detox and is now in the CD-IOP group. [name] says he had 4 yrs and one month sobriety before he relapsed 7 years ago.   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  He says it helps him get "out of himself" to do service work.  [name] shares that he is starting to experience the promises of the [employer]. He speaks specifically of freedom and happiness. [name] says he checked most of the emotions on the internal triggers questionnaire, except for confident, passionate and aroused.  [name] shares his plan for Christmas to deal with any triggers that may present themselves.  He says if he finds himself, he hopes he will call his sponsor. He explains he is forming a good relationship with his sponsor.  [name] says he is working on a network of  other people who have previously been in the program.   Progress Towards Goals: [name] reports no change in his sobriety date.    UDS collected: Yes Results: None   AA/NA attended?:  Yes  Sponsor?:  Yes  Remigio Eisenmenger, MS, LMFT, LCAS [address], [place], [place], [facility], LCAS  06/21/2023

## 2023-06-25 ENCOUNTER — Ambulatory Visit (INDEPENDENT_AMBULATORY_CARE_PROVIDER_SITE_OTHER): Payer: MEDICAID

## 2023-06-25 DIAGNOSIS — F1020 Alcohol dependence, uncomplicated: Secondary | ICD-10-CM

## 2023-06-25 DIAGNOSIS — F1994 Other psychoactive substance use, unspecified with psychoactive substance-induced mood disorder: Secondary | ICD-10-CM

## 2023-06-25 DIAGNOSIS — F1420 Cocaine dependence, uncomplicated: Secondary | ICD-10-CM

## 2023-06-25 NOTE — Progress Notes (Signed)
Daily Group Progress Note   Program: CD IOP     Group Time: 9:00  am-12 pm      Type of Therapy: Process and Psychoeducational    Topic: The therapists check in with group members, assess for SI/HI/psychosis and overall level of functioning. The therapists inquire about sobriety date and number of community support meetings attended since last session.   Therapist introduce the new group member. Therapists utilize the material in the [employer] on External Triggers Questionnaire. Therapists prompts the group on the external triggers they resonate with and discuss settings,activities, situations and setting where group members have used. Therapist also discuss activities, situations settings where groups member would not use and people they could be with an not use.   Therapists discuss other issues as well, including Post Acute Withdrawal Symptoms, Halt (Hungry, angry, lonely and tired as emotions that can trigger use. Therapist discuss assertiveness as a way to channel anger.  Therapist discuss what it means to be an introvert and extrovert and how these may interfere with recovery.   Summary: Kaston presents today rating both his depression as a "0" and anxiety as being a "0".  Edrick identifies his emotions as "happy, optimistic, grateful and free". He reports he is attending meetings and attended one on Christmas day.  He reports the same sobriety date. He also has a sponsor and is starting step work.  Richter says he had a great Christmas. He spent it alone and found it quite peaceful. Zakee says his older brother called yesterday. Willies explains that his older brother has not spoken to Kemauri's twin brother in 3 yrs since their mother passed. The decision was made to sell their mother's house, however Hersey's twin was living in it and could not afford to purchase it.  Jetson says he made the conscious decision to stay out of his situation and not allow others to take his  peace  Kensley says he checked all the external triggers on the questionnaire. Yacob says that when he went to residential treatment he became angry because the other peers were in a different stage of change than he was. Salatiel says the people he could be around and not use are his grown children.  Progress Towards Goals: [name] reports no change in his sobriety date.    UDS collected: No  Results: Negative   AA/NA attended?:  Yes  Sponsor?:  Yes  Remigio Eisenmenger, MS, LMFT, LCAS [address] [place], [place], [facility], LCAS  06/25/2023

## 2023-06-28 ENCOUNTER — Ambulatory Visit (INDEPENDENT_AMBULATORY_CARE_PROVIDER_SITE_OTHER): Payer: MEDICAID

## 2023-06-28 DIAGNOSIS — F1420 Cocaine dependence, uncomplicated: Secondary | ICD-10-CM | POA: Diagnosis not present

## 2023-06-28 DIAGNOSIS — F1994 Other psychoactive substance use, unspecified with psychoactive substance-induced mood disorder: Secondary | ICD-10-CM | POA: Diagnosis not present

## 2023-06-28 DIAGNOSIS — F1020 Alcohol dependence, uncomplicated: Secondary | ICD-10-CM | POA: Diagnosis not present

## 2023-06-28 NOTE — Progress Notes (Signed)
Daily Group Progress Note   Program: CD IOP     Group Time: 9:00  am-12 pm      Type of Therapy: Process and Psychoeducational    Topic: The therapists check in with group members, assess for SI/HI/psychosis and overall level of functioning. The therapists inquire about sobriety date and number of community support meetings attended since last session.   Therapists discuss anger as it relates to recovery today. Therapist use the Matrix Model "Managing Anger" which presents information regarding anger being an emotion, how to recognize when one is angry, positive and negative ways to express anger. Therapists also discuss other emotions from where anger can stem and levels of anger (Annoyance, Frustration, Hostility  and Rage) and the possible expressions of each level. Therapist discuss the importance of processing lower levels of anger so anger does not escalate to Rage. Therapists address the group being rather quiet and ask members to elaborate on why they are in group and what they may need and hope to get out of CD-IOP.   Summary: Treyson presents today rating  his depression as a "0" and anxiety as being a "3".  Ollivander reports he has the same sobriety date. He is attending meetings and has a sponsor. In response to the discussions on anger, Kahmani says he thinks anger ties in to pride and entitlement. He elaborates by describing a situation he witnessed where a woman became angry in the grocery store because there was not an available check out line where there was a person to check customers out.  Duane says there was a Barrister's clerk available but the woman would not make use of it.  Donta shares that an example of pride as it relates to anger is if one gets angry to keeping a "one up on someone" and where they think, "if it's not my way, it's the highway".   Rexie shares as he learned more about how the government works he became more angry. He says dishonesty angers me.  Chananya says  a positive way to address anger is to walk away from the situation/person(s). Jamarri shares that in [place] it is said "don't let someone rent space in your head". Amareon says another  positive way to deal with anger is prayer. When asked about his takeaway from the group today, Jacquez says that this usually comes to him once he is at home and reflects back.  Progress Towards Goals: [name] reports no change in his sobriety date.    UDS collected: Yes Results: None   AA/NA attended?:  Yes  Sponsor?:  Yes  Remigio Eisenmenger, MS, LMFT, LCAS [address], [place], [place], [facility], LCAS  06/28/2023

## 2023-07-01 ENCOUNTER — Telehealth: Payer: Self-pay | Admitting: Physician Assistant

## 2023-07-01 ENCOUNTER — Other Ambulatory Visit: Payer: Self-pay | Admitting: Nurse Practitioner

## 2023-07-01 ENCOUNTER — Other Ambulatory Visit: Payer: Self-pay | Admitting: Physician Assistant

## 2023-07-01 DIAGNOSIS — M7541 Impingement syndrome of right shoulder: Secondary | ICD-10-CM | POA: Insufficient documentation

## 2023-07-01 NOTE — Telephone Encounter (Signed)
 Patient called and had a injection and he said that [place] told him to let her know if its not working. He said its not and that she suppose to refer him to someone. IH#[phone]

## 2023-07-02 ENCOUNTER — Ambulatory Visit (INDEPENDENT_AMBULATORY_CARE_PROVIDER_SITE_OTHER): Payer: MEDICAID | Admitting: Licensed Clinical Social Worker

## 2023-07-02 DIAGNOSIS — F1020 Alcohol dependence, uncomplicated: Secondary | ICD-10-CM | POA: Diagnosis not present

## 2023-07-02 DIAGNOSIS — F1420 Cocaine dependence, uncomplicated: Secondary | ICD-10-CM

## 2023-07-02 DIAGNOSIS — F17200 Nicotine dependence, unspecified, uncomplicated: Secondary | ICD-10-CM

## 2023-07-02 DIAGNOSIS — F1994 Other psychoactive substance use, unspecified with psychoactive substance-induced mood disorder: Secondary | ICD-10-CM

## 2023-07-02 NOTE — Progress Notes (Signed)
 Daily Group Progress Note   Program: CD IOP     Group Time: 9 am-12 pm    Type of Therapy: Process and Psychoeducational    Topic: The therapist checks in with group members, assesses for SI/HI/psychosis and overall level of functioning. The therapist inquires about sobriety date and number of community support meetings attended since last session.   The therapist has group members read and discuss the NA Just for Today reading dealing with Vigilance. The therapist expands extensively on one member's observation that it is important for people in recovery to know they have a disease and to know what a disease is. The therapist has group members complete a questionnaire regarding their perceived level of social support observing that sometimes peoples' perception of support does not match with reality. For example, a person may perceive that his or her using friends are supports only to find that they are a threat to one's recovery.  The therapist shares was research says about recovery taking place in fellowship and elicits responses from group members concerning why meeting attendance in recovery is stressed and what people get out of meetings. The therapist has group members complete the sentence, most people are by answering either trustworthy or not trustworthy.     Summary: Escher presents today rating both his depression and anxiety as a 0. He describes his mood as being happy, optimistic, grateful, and free.   He is very active in today's group discussion and is the member who indicates that people with addiction must understand that they have a disease and what a disease is; however, when asked to explain how it is a disease and what a disease is, he struggles to answer both questions. He is also surprised to learn about the genetic aspect of the disease of addiction. When asked about family history of addiction, all group members present raise their hands affirming a family  history of addiction.   Chyrl falls asleep at one point during group and is not response to attempts to rouse him. He later explains that he is working nights at the homeless shelter and that he has been offered a paid job starting next week. The therapist talks about self-care and managing one's schedule so as to not overload one's self. Chyrl says that he has not had any help at the shelter with one person having to be awake so it has been him.   He says that he did a meeting yesterday. Chyrl is one of several group members who believes that most people are not trustworthy.    Progress Towards Goals: Rondrick reports no change in his sobriety date.  UDS collected: Yes results: Yes, negative for drugs and alcohol   AA/NA attended?:  Yes  Sponsor?:  Yes   Elsie Maier, MA, LCSW, [facility], LCAS Darice Simpler, MS, LMFT, LCAS 07/02/2023

## 2023-07-05 ENCOUNTER — Ambulatory Visit (HOSPITAL_COMMUNITY): Payer: MEDICAID

## 2023-07-05 ENCOUNTER — Encounter (HOSPITAL_COMMUNITY): Payer: Self-pay

## 2023-07-07 ENCOUNTER — Ambulatory Visit (INDEPENDENT_AMBULATORY_CARE_PROVIDER_SITE_OTHER): Payer: MEDICAID | Admitting: Licensed Clinical Social Worker

## 2023-07-07 DIAGNOSIS — F1420 Cocaine dependence, uncomplicated: Secondary | ICD-10-CM

## 2023-07-07 DIAGNOSIS — F17200 Nicotine dependence, unspecified, uncomplicated: Secondary | ICD-10-CM

## 2023-07-07 DIAGNOSIS — F1020 Alcohol dependence, uncomplicated: Secondary | ICD-10-CM

## 2023-07-07 NOTE — Progress Notes (Signed)
 Daily Group Progress Note   Program: CD IOP     Group Time: 9 am-10:30 a.m.       Type of Therapy: Process and Psychoeducational    Topic: The therapists check in with group members, assess for SI/HI/psychosis and overall level of functioning. The therapists inquire about sobriety date and number of community support meetings attended since last session.    The therapists introduce a new group member.  The therapists educate group members on the family dynamics of addiction and how the family system can sometimes begin to push back against a person's recovery.  Thus, the importance of family therapy is emphasized.  The therapists explained that in order for people to recover that they will have to be willing to get out of their comfort zone and take action.  The therapists inform them that those who have obtained long-term sobriety can provide them with the action plan to follow; however, many people in early recovery are stuck in self-will believing that they do not have to follow these guidelines as they are somehow unique or different from others with the disease of addiction. Individuals who engage in self-sponsorship experience revolving door relapse.    The therapists emphasize that people in early recovery must avoid triggers at all costs and should focus on being smart rather than trying to be strong.  The therapists show a film on the stages of relapse emphasizing the importance of self-care.     Summary: Baldo presents today rating both his depression and anxiety as a 0. He says that he has some sort of cold sore for which he needs to go to the  urgent care that prevented him from riding his bicycle today as is customary for him with Muhammad taking the bus instead. He leaves group at the midway mark to go to Urgent Care so does not see the video on the stages of relapse.  Prior to leaving, Marissa expresses his anger and frustration after another group members once again shares about  her marital issues leading to drinking. Christophr observes that she has repeatedly brought up this same issue while doing nothing to change it or to attend any meetings or make any other meaningful changes whatsoever   Progress Towards Goals: Brason reports no change in his sobriety date.    UDS collected: Yes Results: No   AA/NA attended?:  Yes   Sponsor?:  Yes   Elsie Maier, MA, LCSW, [facility], LCAS Darice Simpler, MS, LMFT, LCAS 07/07/2023

## 2023-07-08 ENCOUNTER — Ambulatory Visit (HOSPITAL_COMMUNITY): Payer: Self-pay

## 2023-07-09 ENCOUNTER — Encounter (HOSPITAL_COMMUNITY): Payer: Self-pay

## 2023-07-09 ENCOUNTER — Ambulatory Visit (INDEPENDENT_AMBULATORY_CARE_PROVIDER_SITE_OTHER): Payer: MEDICAID | Admitting: Medical

## 2023-07-09 DIAGNOSIS — E349 Endocrine disorder, unspecified: Secondary | ICD-10-CM

## 2023-07-09 DIAGNOSIS — D4101 Neoplasm of uncertain behavior of right kidney: Secondary | ICD-10-CM

## 2023-07-09 DIAGNOSIS — D4121 Neoplasm of uncertain behavior of right ureter: Secondary | ICD-10-CM

## 2023-07-09 DIAGNOSIS — I2510 Atherosclerotic heart disease of native coronary artery without angina pectoris: Secondary | ICD-10-CM

## 2023-07-09 DIAGNOSIS — E781 Pure hyperglyceridemia: Secondary | ICD-10-CM

## 2023-07-09 DIAGNOSIS — F1421 Cocaine dependence, in remission: Secondary | ICD-10-CM

## 2023-07-09 DIAGNOSIS — Z9861 Coronary angioplasty status: Secondary | ICD-10-CM

## 2023-07-09 DIAGNOSIS — F15280 Other stimulant dependence with stimulant-induced anxiety disorder: Secondary | ICD-10-CM

## 2023-07-09 DIAGNOSIS — E1159 Type 2 diabetes mellitus with other circulatory complications: Secondary | ICD-10-CM

## 2023-07-09 DIAGNOSIS — I252 Old myocardial infarction: Secondary | ICD-10-CM

## 2023-07-09 DIAGNOSIS — I10 Essential (primary) hypertension: Secondary | ICD-10-CM

## 2023-07-09 DIAGNOSIS — F1994 Other psychoactive substance use, unspecified with psychoactive substance-induced mood disorder: Secondary | ICD-10-CM

## 2023-07-09 DIAGNOSIS — R7401 Elevation of levels of liver transaminase levels: Secondary | ICD-10-CM

## 2023-07-09 DIAGNOSIS — F17200 Nicotine dependence, unspecified, uncomplicated: Secondary | ICD-10-CM

## 2023-07-09 DIAGNOSIS — F1021 Alcohol dependence, in remission: Secondary | ICD-10-CM

## 2023-07-09 NOTE — Progress Notes (Signed)
 Patient ID: [name], male   DOB: 02/25/1961, 63 y.o.   MRN: [id number]                                                                                [facility] CD IOP                                                                                Discharge Summary   Date of Admission: 05/19/2023 Referall Source:  TRANSITIONS OF CARE                                                                        Date of Discharge: 07/09/2023 Sobriety Date:05/04/2023  Admission Diagnosis:     ICD-10-CM    1. Alcohol  use disorder, severe, dependence ([name])  F10.20       2. Cocaine use disorder, severe, dependence ([name])  F14.20       3. Substance induced mood disorder ([name])  F19.94       4. Tobacco use disorder  F17.200      Only when using     5. CAD S/P percutaneous coronary angioplasty  I25.10      Z98.61       6. 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 Hypertriglyceridemia  E78.1       8. Type 2 diabetes mellitus with other circulatory complication, without long-term current use of insulin  ([name])  E11.59       9. Essential hypertension  I10       10. 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 consistent with a Bosniak IIF tumor     11. Elevated liver transaminase level  R74.01      ALT 0 - 44 U/L       83 High  02/18/2023   Course of Treatment:  At admission, he acknowledged he knew nothing about the addicted brain despite multiple detoxes and hospitalizations.This recent binge was triggered by findings of Rt kidney mass of undetermined pathology (see MRI report rain . He says he began using alcohol  at age 49 and that this triggers his use of cocaine and nicotene.   He was shown MRI's of addicted and craving brains demonstrating the BIOLOGICAL underpinning of addiction  that drives everything else including the psychology/psychiatry (Triggers/thinking) of addiction.Abstinence is the  1st action/step in recovery,opening the door to understanding how to treat ones self successfully. [name] found this information enlightening. He had never experienced symptoms of anxiety and depression outside of practicing/as a consequence of his addiction.  Pt attended 20 Groups the last 07/09/2023 : Counselor Summary: [name] presents today rating both his depression and anxiety as a 0 describing his mood as being happy, optimistic, grateful, and free.   The therapists talk with [name] concerning where his is in relation to being ready to step down with the therapists, the PA-C, and [name] all being in agreement that he is ready. He says that he will step down to services at [facility] of the [place].   He says that he is proud of himself for suiting up and showing up. He says that he cannot believe that it has been 2 months and says that the biggest takeaway he has is that he must be aware all the time that he has this disease and adds that recovery is a gift.     Medications:   Accu-Chek Guide test strip Generic drug: glucose blood USE 1 STRIP TO CHECK GLUCOSE THREE TIMES DAILY AS DIRECTED  amLODipine  10 MG tablet Commonly known as: NORVASC  Take 1 tablet (10 mg total) by mouth daily.  aspirin  EC 81 MG tablet Take by mouth.  Cholecalciferol  25 MCG (1000 UT) tablet Take 1,000 Units by mouth daily.  clopidogrel  75 MG tablet Commonly known as: PLAVIX  Take 1 tablet (75 mg total) by mouth daily.  cyclobenzaprine  10 MG tablet Commonly known as: FLEXERIL  Take 1 tablet by mouth 3 times daily as needed for muscle spasm. Warning: May cause drowsiness.  EPINEPHrine  0.3 mg/0.3 mL Soaj injection Commonly known as: EPI-PEN Inject 0.3 mg into the muscle as needed for anaphylaxis.  ezetimibe  10 MG tablet Commonly known as: ZETIA  Take 1 tablet (10 mg total) by mouth daily.   Livalo  4 MG Tabs Generic drug: Pitavastatin  Calcium  Take 1 tablet by mouth once daily  metFORMIN  500 MG 24 hr tablet Commonly known as: GLUCOPHAGE -XR Take 1 tablet (500 mg total) by mouth daily with breakfast.  metoprolol  tartrate 100 MG tablet Commonly known as: LOPRESSOR  Take 1 tablet (100 mg total) by mouth 2 (two) times daily. NEED OV.  multivitamin with minerals Tabs tablet Take 1 tablet by mouth daily.  nitroGLYCERIN  0.4 MG SL tablet Commonly known as: NITROSTAT  Place 1 tablet (0.4 mg total) under the tongue every 5 (five) minutes x 3 doses as needed for chest pain.  predniSONE  20 MG tablet Commonly known as: DELTASONE  Take 2 tablets (40 mg total) by mouth daily.  Systane 0.4-0.3 % Gel ophthalmic gel Generic drug: Polyethyl Glycol-Propyl Glycol Place 1 Application into both eyes daily as needed (For dry eyes).      Plan of Action to Address Continuing Problems:  Goals and Activities to Help Maintain Sobriety: Stay away from people ,places and things that are triggers Continue practicing Fair Fighting rules in interpersonal conflicts. Continue alcohol  and drug refusal skills and call on support system  Attend AA/NA meetings AT LEAST as often as you use  Obtain a sponsor and a home group in AA/NA. Return to providers as scheduled  Referrals:  [facility]care:[facility] Medication management:NA Other:PDMP Testoterone  Next appointment: See Patient Station  Prognosis:Good if he is able to abstain.Poor /perhaps fatal if not    Client has participated in the development of this discharge plan and has received a copy of this completed plan

## 2023-07-09 NOTE — Progress Notes (Signed)
 Daily Group Progress Note   Program: CD IOP     Group Time: 9 am-12 pm      Type of Therapy: Process and Psychoeducational    Topic: The therapists check in with group members, assess for SI/HI/psychosis and overall level of functioning. The therapists inquire about sobriety date and number of community support meetings attended since last session.    The therapists introduce a new group member. The therapists introduce group members to cognitive therapy and provide a list of twelve types of negative thinking that are obstacles to recovery and six categories of fear facilitating a group discussion on how to identify and changed these.     Summary: Hilton presents today rating both his depression and anxiety as a 0 describing his mood as being happy, optimistic, grateful, and free.  The therapists talk with Eliav concerning where his is in relation to being ready to step down with the therapists, the PA-C, and Leslie all being in agreement that he is ready. He says that he will step down to services at [facility] of the [place].  He says that he is proud of himself for suiting up and showing up. He says that he cannot believe that it has been 2 months and says that the biggest takeaway he has is that he must be aware all the time that he has this disease and adds that recovery is a gift.    Progress Towards Goals: Kuper reports no change in his sobriety date.     UDS collected: No Results: No   AA/NA attended?:  Yes   Sponsor?:  Yes   Elsie Maier, MA, LCSW, [facility], LCAS Darice Simpler, MS, LMFT, LCAS 07/09/2023

## 2023-07-09 NOTE — Progress Notes (Addendum)
 [facility] Follow-up Outpatient CDIOP Date: 06/16/2023  Admission Date:05/19/2023  Sobriety date:05/04/2023  Subjective: I'm doing well  HPI : CD IOP Provider FU Pt seen for FU 4 weeks after entry into CD IOP and 6 weeks since last use. He has not been on any psychiatric medications as he only experiences depression when he uses. He continues to feel free of depression and anxiety and is using 12 Step program along with Group therapy here to abstain.Denies cravings.  Counselor's report: Summary: [name] presents today rating both is depression and anxiety as a 0 and describing his mood as still being happy, optimistic, grateful, and free.  He says that he is still sober. He says that his Sponsor wants him reading the first 164 pages of the Big Book in advance of starting steps asking for feedback on what these pages address.   [name] shared again about his initial hesitancy about getting a Sponsor and what led to this breakthrough. He is very active in the group discussion concerning why people often refuse to admit to being an alcoholic or addict.  At the end of group, he says that his biggest takeaway is how much he appreciates his therapists.   Review of Systems: Psychiatric: Agitation: See Counselor report Hallucination: No Depressed Mood: see Counselor reports Insomnia: No Hypersomnia: No Altered Concentration: No Feels Worthless: No Grandiose Ideas: No Belief In Special Powers: No New/Increased Substance Abuse: No Compulsions: Denies craving  Neurologic: Headache: No Seizure: No Paresthesias: No  Current Medications: Your Medication List Accu-Chek Guide test strip Generic drug: glucose blood USE 1 STRIP TO CHECK GLUCOSE THREE TIMES DAILY AS DIRECTED  amLODipine  10 MG tablet Commonly known as: NORVASC  Take 1 tablet (10 mg total) by mouth daily.  aspirin  EC 81 MG tablet Take by mouth.  Cholecalciferol  25 MCG (1000 UT) tablet Take 1,000 Units by  mouth daily.  clopidogrel  75 MG tablet Commonly known as: PLAVIX  Take 1 tablet (75 mg total) by mouth daily.  cyclobenzaprine  10 MG tablet Commonly known as: FLEXERIL  Take 1 tablet by mouth 3 times daily as needed for muscle spasm. Warning: May cause drowsiness.  EPINEPHrine  0.3 mg/0.3 mL Soaj injection Commonly known as: EPI-PEN Inject 0.3 mg into the muscle as needed for anaphylaxis.  ezetimibe  10 MG tablet Commonly known as: ZETIA  Take 1 tablet (10 mg total) by mouth daily.  Livalo  4 MG Tabs Generic drug: Pitavastatin  Calcium  Take 1 tablet by mouth once daily  metFORMIN  500 MG 24 hr tablet Commonly known as: GLUCOPHAGE -XR Take 1 tablet (500 mg total) by mouth daily with breakfast.  metoprolol  tartrate 100 MG tablet Commonly known as: LOPRESSOR  Take 1 tablet (100 mg total) by mouth 2 (two) times daily. NEED OV.  multivitamin with minerals Tabs tablet Take 1 tablet by mouth daily.  nitroGLYCERIN  0.4 MG SL tablet Commonly known as: NITROSTAT  Place 1 tablet (0.4 mg total) under the tongue every 5 (five) minutes x 3 doses as needed for chest pain.  predniSONE  20 MG tablet Commonly known as: DELTASONE  Take 2 tablets (40 mg total) by mouth daily.  Systane 0.4-0.3 % Gel ophthalmic gel Generic drug: Polyethyl Glycol-Propyl Glycol Place 1 Application into both eyes daily as needed (For dry eyes).    Mental Status Examination  Appearance:Casual Neat Alert: Yes Attention: good  Cooperative: Yes Eye Contact: Good Speech: Clear and coherent, rate WNL Psychomotor Activity: Normal Memory:Intact Concentration/Attention: Normal/intact Oriented: person, place, time/date and situation Mood: Euthymic Affect: Appropriate and Congruent Thought Processes and Associations:  Coherent and 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   MRN: [id number]                                                                                [facility] IOP                                 Prognosis:    Client has participated in the development of this discharge plan and has received a copy of this completed plan

## 2023-07-12 ENCOUNTER — Ambulatory Visit (HOSPITAL_COMMUNITY): Payer: MEDICAID

## 2023-07-14 ENCOUNTER — Ambulatory Visit (HOSPITAL_COMMUNITY): Payer: MEDICAID

## 2023-07-14 ENCOUNTER — Ambulatory Visit (HOSPITAL_COMMUNITY)
Admission: EM | Admit: 2023-07-14 | Discharge: 2023-07-14 | Disposition: A | Discharge status: Home / Self Care | Payer: MEDICAID | Attending: Sports Medicine | Admitting: Sports Medicine

## 2023-07-14 ENCOUNTER — Encounter (HOSPITAL_COMMUNITY): Payer: Self-pay | Admitting: Emergency Medicine

## 2023-07-14 DIAGNOSIS — J069 Acute upper respiratory infection, unspecified: Secondary | ICD-10-CM

## 2023-07-14 NOTE — Discharge Instructions (Signed)
 You have an upper respiratory infection. Most cases are due to a virus and do not require antibiotics for treatment.  Make sure to continue good oral hydration.  Rest as much as you can to help your body fight the infection.  You can take tylenol  or ibuprofen  for fever/pain as needed Start Flonase  daily for the next week and then as needed. You can use nasal saline spray multiple times daily as well.  You can use a daily antihistamine or guaifenesin  as an expectorant but you need to be well hydrated for these medications to work.  Maintain distance from others and wear a mask in public areas to avoid spread  If you start to experience shortness of breath, fevers that don't respond to medication, confusion, profound neck stiffness, or fainting, return to the urgent care or ED.

## 2023-07-14 NOTE — ED Triage Notes (Signed)
 For 4 days patient had cough, congestion, sneezing, body aches, scratchy throat and ears. Hasn't taken any medications for sxs.

## 2023-07-14 NOTE — ED Provider Notes (Signed)
 [facility]    CSN: [id number] Arrival date & time: 07/14/23  0801      History   Chief Complaint Chief Complaint  Patient presents with   Cough   Nasal Congestion    HPI IVO [name] is a 63 y.o. male.   Here with complaint of cough, congestion, body aches, and scratchy throat/ears for the past 4 days. Denies fevers. Hasn't taken any medications for this. No known sick contacts. Reports he did get a flu shot this season. Denies Chest pain, productive cough, N/V, stool changes, abdominal pain, H/a's. Does have hx of EtOH use d/o, HLD, HTN, DM2.   The history is provided by the patient.  Cough Associated symptoms: ear pain, rhinorrhea and sore throat   Associated symptoms: no chills, no diaphoresis, no fever, no shortness of breath and no wheezing     Past Medical History:  Diagnosis Date   Alcohol  dependence ([name])    Allergies    Arthritis    Chest pain    Chronic lower back pain    Chronic pain of right wrist    Cocaine abuse ([name])    per note on 01/18/23   Coronary artery disease    a. Multiple prior caths/PCI. Cath 2013 with possible spasm of RCA, 70% ISR of mid LCx with subsequent DES to mLCx and prox LCX. b. H/o microvascular angina. c. Recurrent angina 08/2014 - s/p PTCA/DES to prox Cx, PTCA/CBA to OM1.  c. LHC 06/10/15 with patent stents and some ISR in LCX and OM-1 that was not flow limiting --> Rx    Dyslipidemia    a. Intolerant to many statins except tolerating Livalo .   GERD (gastroesophageal reflux disease)    H/O cardiac catheterization 10/25/2018   Heart attack [facility])    Hypertension    Myocardial infarction [facility]) ~ 2010   Renal mass    S/P angioplasty with stent, DES, to proximal and mid LCX 12/15/11 12/15/2011   S/P foot surgery, right 04/2021   Shoulder pain    Stroke [facility])    pt. reports had a stroke around time of MI 2010   Type II diabetes mellitus ([name])    Unstable angina [facility])     Patient Active Problem List   Diagnosis Date  Noted   Impingement syndrome of right shoulder 07/01/2023   Pain in right shoulder 06/16/2023   Anxiety disorder, unspecified 02/16/2023   Alcohol  use disorder 02/16/2023   Sleep disorder, unspecified 02/16/2023   Alcohol  abuse 02/15/2023   Alcohol  dependence ([name]) 05/16/2022   Low energy 03/06/2022   Moderate alcohol  dependence in early remission ([name]) 02/24/2022   Suicidal ideation 07/07/2021   Generalized anxiety disorder 07/07/2021   Other allergic rhinitis 09/09/2020   Adverse reaction to food, subsequent encounter 09/09/2020   History of bee sting allergy 09/09/2020   Alcohol  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) 03/19/2020   MDD (major depressive disorder), recurrent episode, severe ([name]) 03/18/2020   History of substance abuse ([name]) 01/04/2020   Severe 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 features ([name]) 12/10/2019   Tinea cruris 07/06/2019   Hemoglobin A1c less than 7.0% 07/06/2019   Muscle strain of wrist 04/05/2019   Pain in both wrists 04/05/2019   Anxiety 04/05/2019   Prediabetes 04/05/2019   MDD (major depressive disorder) 01/12/2019   Chest pain 10/25/2018   MDD (major depressive disorder), recurrent severe, without psychosis ([name]) 08/15/2018   Influenza A 07/17/2017   Lactic acidosis    Cough  Nausea and vomiting    Severe episode of recurrent major depressive disorder, without psychotic features ([name])    MDD (major depressive disorder), single episode, severe with psychotic features ([name]) 06/27/2017   MDD (major depressive disorder), severe ([name]) 06/23/2017   Essential hypertension 01/13/2016   Unstable angina ([name])    ED (erectile dysfunction) 02/15/2014   Atypical chest pain 12/14/2011   Allergy to contrast media (used for diagnostic x-rays) 05/12/2011   Dyslipidemia, goal LDL below 70 05/09/2011   DM2 (diabetes mellitus, type 2) ([name]) 07/10/2010   CAD S/P percutaneous coronary angioplasty 07/10/2010    Past Surgical History:   Procedure Laterality Date   CARDIAC CATHETERIZATION  06/15/2002   LAD with prox 40% stenosis, norma L main, Cfx with 25% lesion, RCA with long mid 25% stenosis (Dr. Arlene Lacy)   CARDIAC CATHETERIZATION  04/01/2010   normal L main, LAD wit mild stenosis, L Cfx with 70% in-stent restenosis, RCA with 70% in-stent restenosis, LVEF >60% (Dr. K. Italy Hilty) - cutting ballon arthrectomy to RCA & Cfx (Dr. Rande Bushy)   CARDIAC CATHETERIZATION  08/25/2010   preserved global LV contractility; multivessel CAD, diffuse 90-95% in-stent restenosis in prox placed Cfx stent - cutting balloon arthrectomy in Cfx with multiple dilatations 90-95% to 0% stenosis (Dr. Electa Grieve)   CARDIAC CATHETERIZATION  01/26/2011   PCI & stenting of aggresive in-stent restenosis within previously stented AV groove Cfx with 3.0x63mm Taxus DES (previous stents were Promus) (Dr. Aleda Ammon)   CARDIAC CATHETERIZATION  05/11/2011   preserved LV function, 40% mid LAD stenosis, 30-40% narrowing proximal to stented semgnet of prox Cfx, patent mid RCA stent with smooth 20% narrowing in distal RCA (Dr. Electa Grieve)   CARDIAC CATHETERIZATION  12/15/2011   PCI & stenting of proximal & mid Cfx with DES - 3.0x36mm in proximal, 3.0x62mm in mid (Dr. Aleda Ammon)   CARDIAC CATHETERIZATION N/A 06/10/2015   Procedure: Left Heart Cath and Coronary Angiography;  Surgeon: Mardell Shade, MD;  Location: [facility] INVASIVE CV LAB;  Service: Cardiovascular;  Laterality: N/A;   CARDIAC CATHETERIZATION  10/25/2018   cardiometabolic testing  08/08/2012   good exercise effort, peak VO2 79% predicted with normal VO2 HR curves (mild deconditioning)   COLONOSCOPY  12/2012   diminutive hyperplastic sigmoid poyp so repeat routine 2024   CORONARY BALLOON ANGIOPLASTY N/A 10/25/2018   Procedure: CORONARY BALLOON ANGIOPLASTY;  Surgeon: Lucendia Rusk, MD;  Location: [facility] INVAS[facility] CV LAB;  Service: Cardiovascular;  Laterality: N/A;   CORONARY BALLOON ANGIOPLASTY N/A 09/29/2019    Procedure: CORONARY BALLOON ANGIOPLASTY;  Surgeon: Sammy Crisp, MD;  Location: [facility] INVASIVE CV LAB;  Service: Cardiovascular;  Laterality: N/A;   CORONARY ULTRASOUND/IVUS N/A 09/29/2019   Procedure: Intravascular Ultrasound/IVUS;  Surgeon: Sammy Crisp, MD;  Location: [facility] CV LAB;  Service: Cardiovascular;  Laterality: N/A;   EXCISIONAL HEMORRHOIDECTOMY  1984   LEFT HEART CATH AND CORONARY ANGIOGRAPHY N/A 10/25/2018   Procedure: LEFT HEART CATH AND CORONARY ANGIOGRAPHY;  Surgeon: Lucendia Rusk, MD;  Location: [facility] INVASIVE CV LAB;  Service: Cardiovascular;  Laterality: N/A;   LEFT HEART CATH AND CORONARY ANGIOGRAPHY N/A 09/29/2019   Procedure: LEFT HEART CATH AND CORONARY ANGIOGRAPHY;  Surgeon: Sammy Crisp, MD;  [facility]: [facility];  Service: Cardiovascular;  Laterality: N/A;   LEFT HEART CATHETERIZATION WITH CORONARY ANGIOGRAM N/A 05/11/2011   Procedure: LEFT HEART CATHETERIZATION WITH CORONARY ANGIOGRAM;  Surgeon: Millicent Ally, MD;  Location: [facility] CATH LAB;  Service: Cardiovascular;  Laterality:  N/A;  Possible percutaneous coronary intervention, possible IVUS   LEFT HEART CATHETERIZATION WITH CORONARY ANGIOGRAM N/A 12/15/2011   Procedure: LEFT HEART CATHETERIZATION WITH CORONARY ANGIOGRAM;  Surgeon: Avanell Leigh, MD;  Location: [facility] CATH LAB;  Service: Cardiovascular;  Laterality: N/A;   LEFT HEART CATHETERIZATION WITH CORONARY ANGIOGRAM N/A 09/05/2014   Procedure: LEFT HEART CATHETERIZATION WITH CORONARY ANGIOGRAM;  Surgeon: Odie Benne, MD;  Location: [facility] CATH LAB;  Service: Cardiovascular;  Laterality: N/A;   LIPOMA EXCISION     back of the head   NM MYOCAR PERF WALL MOTION  02/2012   lexiscan myoview; mild perfusion defect in mid inferolateral & basal inferolateral region (infarct/scar); EF 52%, abnormal but ow risk scan   PERCUTANEOUS CORONARY STENT INTERVENTION (PCI-S)  09/05/2014   Procedure: PERCUTANEOUS CORONARY STENT INTERVENTION (PCI-S);  Surgeon:  Odie Benne, MD;  Location: [facility] CATH LAB;  Service: Cardiovascular;;   TONSILLECTOMY         Home Medications    Prior to Admission medications   Medication Sig Start Date End Date Taking? Authorizing Provider  ACCU-CHEK GUIDE TEST test strip USE 1 STRIP TO CHECK GLUCOSE THREE TIMES DAILY AS DIRECTED 07/01/23   Jerrlyn Morel, NP  amLODipine  (NORVASC ) 10 MG tablet Take 1 tablet (10 mg total) by mouth daily. 05/20/23   Jerrlyn Morel, NP  aspirin  EC 81 MG tablet Take by mouth.    [provider]  Cholecalciferol  25 MCG (1000 UT) tablet Take 1,000 Units by mouth daily.    [provider]  clopidogrel  (PLAVIX ) 75 MG tablet Take 1 tablet (75 mg total) by mouth daily. 02/15/23   Jerrlyn Morel, NP  cyclobenzaprine  (FLEXERIL ) 10 MG tablet Take 1 tablet by mouth 3 times daily as needed for muscle spasm. Warning: May cause drowsiness. 06/03/23   Afton Albright, MD  EPINEPHrine  0.3 mg/0.3 mL IJ SOAJ injection Inject 0.3 mg into the muscle as needed for anaphylaxis. 05/07/21   Sean Czar, MD  ezetimibe  (ZETIA ) 10 MG tablet Take 1 tablet (10 mg total) by mouth daily. 05/20/23   Jerrlyn Morel, NP  LIVALO  4 MG TABS Take 1 tablet by mouth once daily 04/07/23   Hilty, Aviva Lemmings, MD  metFORMIN  (GLUCOPHAGE -XR) 500 MG 24 hr tablet Take 1 tablet (500 mg total) by mouth daily with breakfast. 05/20/23 08/18/23  Nichols, Tonya S, NP  metoprolol  tartrate (LOPRESSOR ) 100 MG tablet Take 1 tablet (100 mg total) by mouth 2 (two) times daily. NEED OV. 05/20/23   Jerrlyn Morel, NP  Multiple Vitamin (MULTIVITAMIN WITH MINERALS) TABS tablet Take 1 tablet by mouth daily. 08/13/22   Augusta Blizzard, MD  nitroGLYCERIN  (NITROSTAT ) 0.4 MG SL tablet Place 1 tablet (0.4 mg total) under the tongue every 5 (five) minutes x 3 doses as needed for chest pain. 07/18/21   Hilty, Aviva Lemmings, MD  Polyethyl Glycol-Propyl Glycol (SYSTANE) 0.4-0.3 % GEL ophthalmic gel Place 1 Application into both eyes daily as  needed (For dry eyes).    [provider]  predniSONE  (DELTASONE ) 20 MG tablet Take 2 tablets (40 mg total) by mouth daily. 06/03/23   Afton Albright, MD    Family History Family History  Problem Relation Age of Onset   Leukemia Mother    Prostate cancer Father    Cancer Brother    Coronary artery disease Paternal Grandmother    Cancer Paternal Grandfather    Migraines Neg Hx    Headache Neg Hx    Colon  cancer Neg Hx    Esophageal cancer Neg Hx    Rectal cancer Neg Hx    Stomach cancer Neg Hx     Social History Social History   Tobacco Use   Smoking status: Former    Current packs/day: 0.00    Average packs/day: 1 pack/day for 10.0 years (10.0 ttl pk-yrs)    Types: Cigarettes    Start date: 10/06/2008    Quit date: 10/07/2018    Years since quitting: 4.7   Smokeless tobacco: Never  Vaping Use   Vaping status: Never Used  Substance Use Topics   Alcohol  use: Not Currently    Comment: 05/28/2023-stopped drinking 22 days ago.   Drug use: Not Currently    Types: Cocaine     Allergies   Bee venom, Gadolinium, Shellfish allergy, Statins, and Testosterone  cypionate   Review of Systems Review of Systems  Constitutional:  Positive for fatigue. Negative for chills, diaphoresis and fever.  HENT:  Positive for congestion, ear pain, postnasal drip, rhinorrhea and sore throat. Negative for ear discharge, sinus pressure, sinus pain, trouble swallowing and voice change.   Respiratory:  Positive for cough. Negative for chest tightness, shortness of breath and wheezing.      Physical Exam Triage Vital Signs ED Triage Vitals  Encounter Vitals Group     BP 07/14/23 0825 124/86     Systolic BP Percentile --      Diastolic BP Percentile --      Pulse Rate 07/14/23 0825 74     Resp 07/14/23 0825 17     Temp 07/14/23 0825 98.2 F (36.8 C)     Temp Source 07/14/23 0825 Oral     SpO2 07/14/23 0825 96 %     Weight --      Height --      Head Circumference --      Peak  Flow --      Pain Score 07/14/23 0824 0     Pain Loc --      Pain Education --      Exclude from Growth Chart --    No data found.  Updated Vital Signs BP 124/86 (BP Location: Left Arm)   Pulse 74   Temp 98.2 F (36.8 C) (Oral)   Resp 17   SpO2 96%   Visual Acuity Right Eye Distance:   Left Eye Distance:   Bilateral Distance:    Right Eye Near:   Left Eye Near:    Bilateral Near:     Physical Exam Constitutional:      General: He is not in acute distress.    Appearance: Normal appearance. He is normal weight. He is not toxic-appearing.  HENT:     Head: Normocephalic and atraumatic.     Right Ear: Tympanic membrane, ear canal and external ear normal.     Left Ear: Tympanic membrane, ear canal and external ear normal.     Nose: Rhinorrhea present.     Mouth/Throat:     Mouth: Mucous membranes are moist.     Pharynx: Posterior oropharyngeal erythema present. No oropharyngeal exudate.  Eyes:     Extraocular Movements: Extraocular movements intact.     Conjunctiva/sclera: Conjunctivae normal.     Pupils: Pupils are equal, round, and reactive to light.  Cardiovascular:     Rate and Rhythm: Normal rate.     Pulses: Normal pulses.     Heart sounds: No murmur heard.    No friction rub. No gallop.  Pulmonary:     Effort: Pulmonary effort is normal. No respiratory distress.     Breath sounds: Normal breath sounds. No wheezing, rhonchi or rales.  Chest:     Chest wall: No tenderness.  Abdominal:     General: Abdomen is flat.     Palpations: Abdomen is soft.  Musculoskeletal:     Cervical back: Normal range of motion and neck supple. No tenderness.  Lymphadenopathy:     Cervical: No cervical adenopathy.  Skin:    General: Skin is warm.     Capillary Refill: Capillary refill takes less than 2 seconds.     Findings: No rash.  Neurological:     General: No focal deficit present.     Mental Status: He is alert and oriented to person, place, and time. Mental status is at  baseline.  Psychiatric:        Mood and Affect: Mood normal.        Behavior: Behavior normal.      UC Treatments / Results  Labs (all labs ordered are listed, but only abnormal results are displayed) Labs Reviewed - No data to display  EKG   Radiology No results found.  Procedures Procedures (including critical care time)  Medications Ordered in UC Medications - No data to display  Initial Impression / Assessment and Plan / UC Course  I have reviewed the triage vital signs and the nursing notes.  Pertinent labs & imaging results that were available during my care of the patient were reviewed by me and considered in my medical decision making (see chart for details).     Vitals and triage reviewed, patient is hemodynamically stable. Presentation most c/w viral URI. No clear indication for viral swabbing at this time as he is beyond time frame for antiviral therapy for FLU or COVID and his symptoms are mild. Reviewed supportive care measures for treatment as in AVS. Reviewed return precautions. Patient's questions were answered and they are in agreement with this plan.   Final Clinical Impressions(s) / UC Diagnoses   Final diagnoses:  Viral URI with cough     Discharge Instructions      You have an upper respiratory infection. Most cases are due to a virus and do not require antibiotics for treatment.  Make sure to continue good oral hydration.  Rest as much as you can to help your body fight the infection.  You can take tylenol  or ibuprofen  for fever/pain as needed Start Flonase  daily for the next week and then as needed. You can use nasal saline spray multiple times daily as well.  You can use a daily antihistamine or guaifenesin  as an expectorant but you need to be well hydrated for these medications to work.  Maintain distance from others and wear a mask in public areas to avoid spread  If you start to experience shortness of breath, fevers that don't respond to  medication, confusion, profound neck stiffness, or fainting, return to the urgent care or ED.   ED Prescriptions   None    PDMP not reviewed this encounter.   Marliss Simple, MD 07/14/23 [phone]

## 2023-07-15 ENCOUNTER — Ambulatory Visit: Payer: MEDICAID | Attending: Physician Assistant | Admitting: Physical Therapy

## 2023-07-15 ENCOUNTER — Encounter: Payer: Self-pay | Admitting: Physical Therapy

## 2023-07-15 DIAGNOSIS — M7541 Impingement syndrome of right shoulder: Secondary | ICD-10-CM | POA: Diagnosis not present

## 2023-07-15 DIAGNOSIS — M25511 Pain in right shoulder: Secondary | ICD-10-CM | POA: Diagnosis present

## 2023-07-15 NOTE — Therapy (Signed)
OUTPATIENT PHYSICAL THERAPY SHOULDER EVALUATION   Patient Name: [name] MRN: [id number] DOB:[birth year], 63 y.o., male Today's Date: 07/15/2023  END OF SESSION:  PT End of Session - 07/15/23 1037     Visit Number 1    Number of Visits 7    Date for PT Re-Evaluation 08/26/23    Authorization Type [employer]    PT Start Time [id number]    PT Stop Time 0935    PT Time Calculation (min) 40 min    Activity Tolerance Patient tolerated treatment well    Behavior During Therapy [facility] for tasks assessed/performed             Past Medical History:  Diagnosis Date   Alcohol dependence ([name])    Allergies    Arthritis    Chest pain    Chronic lower back pain    Chronic pain of right wrist    Cocaine abuse ([name])    per note on 01/18/23   Coronary artery disease    a. Multiple prior caths/PCI. Cath 2013 with possible spasm of RCA, 70% ISR of mid LCx with subsequent DES to mLCx and prox LCX. b. H/o microvascular angina. c. Recurrent angina 08/2014 - s/p PTCA/DES to prox Cx, PTCA/CBA to OM1.  c. LHC 06/10/15 with patent stents and some ISR in LCX and OM-1 that was not flow limiting --> Rx    Dyslipidemia    a. Intolerant to many statins except tolerating Livalo.   GERD (gastroesophageal reflux disease)    H/O cardiac catheterization 10/25/2018   Heart attack [facility])    Hypertension    Myocardial infarction [facility]) ~ 2010   Renal mass    S/P angioplasty with stent, DES, to proximal and mid LCX 12/15/11 12/15/2011   S/P foot surgery, right 04/2021   Shoulder pain    Stroke ([name])    pt. reports had a stroke around time of MI 2010   Type II diabetes mellitus ([name])    Unstable angina [facility])    Past Surgical History:  Procedure Laterality Date   CARDIAC CATHETERIZATION  06/15/2002   LAD with prox 40% stenosis, norma L main, Cfx with 25% lesion, RCA with long mid 25% stenosis (Dr. Daiva Nakayama)   CARDIAC CATHETERIZATION  04/01/2010   normal L main, LAD wit mild stenosis, L Cfx with 70%  in-stent restenosis, RCA with 70% in-stent restenosis, LVEF >60% (Dr. K. Italy Hilty) - cutting ballon arthrectomy to RCA & Cfx (Dr. Langston Reusing)   CARDIAC CATHETERIZATION  08/25/2010   preserved global LV contractility; multivessel CAD, diffuse 90-95% in-stent restenosis in prox placed Cfx stent - cutting balloon arthrectomy in Cfx with multiple dilatations 90-95% to 0% stenosis (Dr. Bishop Limbo)   CARDIAC CATHETERIZATION  01/26/2011   PCI & stenting of aggresive in-stent restenosis within previously stented AV groove Cfx with 3.0x83mm Taxus DES (previous stents were Promus) (Dr. Erlene Quan)   CARDIAC CATHETERIZATION  05/11/2011   preserved LV function, 40% mid LAD stenosis, 30-40% narrowing proximal to stented semgnet of prox Cfx, patent mid RCA stent with smooth 20% narrowing in distal RCA (Dr. Bishop Limbo)   CARDIAC CATHETERIZATION  12/15/2011   PCI & stenting of proximal & mid Cfx with DES - 3.0x98mm in proximal, 3.0x69mm in mid (Dr. Erlene Quan)   CARDIAC CATHETERIZATION N/A 06/10/2015   Procedure: Left Heart Cath and Coronary Angiography;  Surgeon: Dolores Patty, MD;  Location: [facility] INVASIVE CV LAB;  Service: Cardiovascular;  Laterality: N/A;   CARDIAC  CATHETERIZATION  10/25/2018   cardiometabolic testing  08/08/2012   good exercise effort, peak VO2 79% predicted with normal VO2 HR curves (mild deconditioning)   COLONOSCOPY  12/2012   diminutive hyperplastic sigmoid poyp so repeat routine 2024   CORONARY BALLOON ANGIOPLASTY N/A 10/25/2018   Procedure: CORONARY BALLOON ANGIOPLASTY;  Surgeon: Corky Crafts, MD;  Location: [facility] INVASIVE CV LAB;  Service: Cardiovascular;  Laterality: N/A;   CORONARY BALLOON ANGIOPLASTY N/A 09/29/2019   Procedure: CORONARY BALLOON ANGIOPLASTY;  Surgeon: Yvonne Kendall, MD;  Location: [facility] INVASIVE CV LAB;  Service: Cardiovascular;  Laterality: N/A;   CORONARY ULTRASOUND/IVUS N/A 09/29/2019   Procedure: Intravascular Ultrasound/IVUS;  Surgeon: Yvonne Kendall, MD;  Location:  [facility] INVASIVE CV LAB;  Service: Cardiovascular;  Laterality: N/A;   EXCISIONAL HEMORRHOIDECTOMY  1984   LEFT HEART CATH AND CORONARY ANGIOGRAPHY N/A 10/25/2018   Procedure: LEFT HEART CATH AND CORONARY ANGIOGRAPHY;  Surgeon: Corky Crafts, MD;  Location: [facility] INVASIVE CV LAB;  Service: Cardiovascular;  Laterality: N/A;   LEFT HEART CATH AND CORONARY ANGIOGRAPHY N/A 09/29/2019   Procedure: LEFT HEART CATH AND CORONARY ANGIOGRAPHY;  Surgeon: Yvonne Kendall, MD;  Location: [facility] INVASIVE CV LAB;  Service: Cardiovascular;  Laterality: N/A;   LEFT HEART CATHETERIZATION WITH CORONARY ANGIOGRAM N/A 05/11/2011   Procedure: LEFT HEART CATHETERIZATION WITH CORONARY ANGIOGRAM;  Surgeon: Lennette Bihari, MD;  Location: [facility] CATH LAB;  Service: Cardiovascular;  Laterality: N/A;  Possible percutaneous coronary intervention, possible IVUS   LEFT HEART CATHETERIZATION WITH CORONARY ANGIOGRAM N/A 12/15/2011   Procedure: LEFT HEART CATHETERIZATION WITH CORONARY ANGIOGRAM;  Surgeon: Runell Gess, MD;  Location: [facility] CATH LAB;  Service: Cardiovascular;  Laterality: N/A;   LEFT HEART CATHETERIZATION WITH CORONARY ANGIOGRAM N/A 09/05/2014   Procedure: LEFT HEART CATHETERIZATION WITH CORONARY ANGIOGRAM;  Surgeon: Kathleene Hazel, MD;  Location: [facility] CATH LAB;  Service: Cardiovascular;  Laterality: N/A;   LIPOMA EXCISION     back of the head   NM MYOCAR PERF WALL MOTION  02/2012   lexiscan myoview; mild perfusion defect in mid inferolateral & basal inferolateral region (infarct/scar); EF 52%, abnormal but ow risk scan   PERCUTANEOUS CORONARY STENT INTERVENTION (PCI-S)  09/05/2014   Procedure: PERCUTANEOUS CORONARY STENT INTERVENTION (PCI-S);  Surgeon: Kathleene Hazel, MD;  Location: [facility] CATH LAB;  Service: Cardiovascular;;   TONSILLECTOMY     Patient Active Problem List   Diagnosis Date Noted   Impingement syndrome of right shoulder 07/01/2023   Pain in right shoulder 06/16/2023   Anxiety disorder, unspecified  02/16/2023   Alcohol use disorder 02/16/2023   Sleep disorder, unspecified 02/16/2023   Alcohol abuse 02/15/2023   Alcohol dependence ([name]) 05/16/2022   Low energy 03/06/2022   Moderate alcohol dependence in early remission ([name]) 02/24/2022   Suicidal ideation 07/07/2021   Generalized anxiety disorder 07/07/2021   Other allergic rhinitis 09/09/2020   Adverse reaction to food, subsequent encounter 09/09/2020   History of bee sting allergy 09/09/2020   Alcohol dependence with alcohol-induced mood disorder ([name]) 03/19/2020   MDD (major depressive disorder), recurrent episode, severe ([name]) 03/18/2020   History of substance abuse ([name]) 01/04/2020   Severe 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 features ([name]) 12/10/2019   Tinea cruris 07/06/2019   Hemoglobin A1c less than 7.0% 07/06/2019   Muscle strain of wrist 04/05/2019   Pain in both wrists 04/05/2019   Anxiety 04/05/2019   Prediabetes 04/05/2019   MDD (major depressive disorder) 01/12/2019   Chest pain 10/25/2018   MDD (major depressive  disorder), recurrent severe, without psychosis ([name]) 08/15/2018   Influenza A 07/17/2017   Lactic acidosis    Cough    Nausea and vomiting    Severe episode of recurrent major depressive disorder, without psychotic features ([name])    MDD (major depressive disorder), single episode, severe with psychotic features ([name]) 06/27/2017   MDD (major depressive disorder), severe ([name]) 06/23/2017   Essential hypertension 01/13/2016   Unstable angina ([name])    ED (erectile dysfunction) 02/15/2014   Atypical chest pain 12/14/2011   Allergy to contrast media (used for diagnostic x-rays) 05/12/2011   Dyslipidemia, goal LDL below 70 05/09/2011   DM2 (diabetes mellitus, type 2) ([name]) 07/10/2010   CAD S/P percutaneous coronary angioplasty 07/10/2010    PCP: Angus Seller NP   REFERRING PROVIDER: Persons, Mary PA   REFERRING DIAG: Rt shoulder pain , impingement   THERAPY DIAG:  Acute pain of right  shoulder  Rationale for Evaluation and Treatment: Rehabilitation  ONSET DATE: 2-3 mos   SUBJECTIVE:                                                                                                                                                                                      SUBJECTIVE STATEMENT: Patient presents with right-sided shoulder pain of less than 3 months duration.  He reports pain initially started as he was seated and working on his computer reaching across his body multiple times.  The pain wakes him up at night.  He reports it is hard to lift and rack his bike, complete ADLs bathing dressing and hygiene without increased shoulder pain.  He has not had physical therapy for the shoulder before and has never had surgery.  The right arm feels weak functionally but he does not drop items or have any weakness in his grip.  His pain does not radiate.  Recently had an injection posterior right shoulder but he reports no improvement or change. Hand dominance: Right  PERTINENT HISTORY: MI, multiple cardiac catheterizations  PAIN:  Are you having pain? Yes: NPRS scale: none at rest , is severe briefly when using to lift , 10/10  Pain location: Rt  lateral shoulder  Pain description: sharp, achy  Aggravating factors: using it  Relieving factors: rest, ice pack/hot shower   PRECAUTIONS: None  RED FLAGS: None   WEIGHT BEARING RESTRICTIONS: No  FALLS:  Has patient fallen in last 6 months? 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  DIAGNOSTIC FINDINGS:   None  PATIENT SURVEYS:  FOTO 53%  COGNITION: Overall cognitive status: Within functional limits for tasks assessed     SENSATION: WFL  POSTURE: Good sitting posture without cues   UPPER EXTREMITY MMT   MMT Right eval Left eval  Shoulder flexion 4+ pain  5  Shoulder extension    Shoulder abduction 4+ pain  5  Shoulder adduction    Shoulder internal rotation 5    Shoulder external rotation 5 pain    Elbow flexion    Elbow extension    Wrist flexion    Wrist extension    Wrist ulnar deviation    Wrist radial deviation    Wrist pronation    Wrist supination    (Blank rows = not tested)  UPPER EXTREMITY ROM   AROM  Right eval Left eval  Shoulder flexion 165   Shoulder extension 160   Shoulder abduction    Shoulder adduction    Shoulder internal rotation Functional reach toT 7   Shoulder external rotation Functional reach to T3, full in supine with arm at 90 deg abd   Grip strength (lbs) 83 pounds 79 pounds  (Blank rows = not tested)  SHOULDER SPECIAL TESTS: Impingement tests: Neer impingement test: positive  Pain with cross body adduction  JOINT MOBILITY TESTING:  Generalized joint stiffness in all directions of right glenohumeral motion   PALPATION:  Min TTP to the right anterolateral aspect of shoulder, no crepitus                                                                                                                              TREATMENT DATE: 07/15/2023 Patient performed program prescribed: Green Thera-Band bilateral horizontal abduction, external rotation and right-sided scaption and side-lying  PATIENT EDUCATION: Education details: Plan of care, impingement versus tendinitis, posture rotator cuff and scapula model, home program Person educated: Patient Education method: Explanation, Demonstration, Verbal cues, and Handouts Education comprehension: verbalized understanding and needs further education  HOME EXERCISE PROGRAM: Access Code:  [email or web address] URL: [email or web address] Date: 07/15/2023 Prepared by: Karie Mainland  Exercises - Shoulder External Rotation and Scapular Retraction with Resistance  - 1-2 x daily - 7 x weekly - 2 sets - 10 reps - 5 hold - Sidelying Shoulder Abduction with Resistance to 60 Degrees  - 1-2 x daily - 7 x weekly - 2 sets - 10 reps - 5 hold - Standing Shoulder Horizontal Abduction with Resistance  - 1-2 x daily - 7 x weekly - 2 sets - 10 reps - 5 hold  ASSESSMENT:  CLINICAL IMPRESSION: Patient is a 62y.o. 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   OBJECTIVE  IMPAIRMENTS: decreased mobility, decreased ROM, decreased strength, increased fascial restrictions, impaired flexibility, impaired UE functional use, and pain.   ACTIVITY LIMITATIONS: carrying, lifting, sleeping, reach over head, and hygiene/grooming  PARTICIPATION LIMITATIONS: interpersonal relationship and community activity  PERSONAL FACTORS: Past/current experiences and 1 comorbidity: Cardiac issues  are also affecting patient's functional outcome.   REHAB POTENTIAL: Excellent  CLINICAL DECISION MAKING: Unstable/unpredictable  EVALUATION COMPLEXITY: Low   GOALS: Goals reviewed with patient? Yes  LONG TERM GOALS: Target date: 08/26/2023    Patient will be independent with home exercise program for right upper extremity Baseline:  Goal status: INITIAL  2.  Patient will increase right shoulder strength to 5 out of 5 in all planes of motion without increased pain Baseline:  Goal status: INITIAL  3.  Patient will be able to rack his bike and city bus without increased pain 75% of the time Baseline:  Goal status: INITIAL  4.  FOTO score will improve to 59% in order to demonstrate improved functional mobility Baseline: 53% Goal status: INITIAL   PLAN:  PT  FREQUENCY: 1x/week  PT DURATION: 6 weeks  PLANNED INTERVENTIONS: 97164- PT Re-evaluation, 97110-Therapeutic exercises, 97530- Therapeutic activity, 97112- Neuromuscular re-education, 97535- Self Care, 60454- Manual therapy, Taping, Dry Needling, Joint mobilization, Cryotherapy, and Moist heat  PLAN FOR NEXT SESSION: HEP check, add on as tolerated, review lifting technique for racking his bike    Dylanie Quesenberry, PT 07/15/2023, 10:38 AM   Karie Mainland, PT 07/15/23 10:53 AM Phone: [phone] Fax: [phone]   For all possible CPT codes, reference the Planned Interventions line above.     Check all conditions that are expected to impact treatment: {Conditions expected to impact treatment:Diabetes mellitus and Social determinants of health   If treatment provided at initial evaluation, no treatment charged due to lack of authorization.

## 2023-07-16 ENCOUNTER — Ambulatory Visit (HOSPITAL_COMMUNITY): Payer: MEDICAID

## 2023-07-16 ENCOUNTER — Encounter (HOSPITAL_COMMUNITY): Payer: Self-pay

## 2023-07-16 ENCOUNTER — Ambulatory Visit (HOSPITAL_COMMUNITY)
Admission: RE | Admit: 2023-07-16 | Discharge: 2023-07-16 | Disposition: A | Discharge status: Home / Self Care | Payer: MEDICAID | Source: Ambulatory Visit | Attending: Emergency Medicine | Admitting: Emergency Medicine

## 2023-07-16 VITALS — BP 131/89 | HR 102 | Temp 98.1°F | Resp 16

## 2023-07-16 DIAGNOSIS — J0100 Acute maxillary sinusitis, unspecified: Secondary | ICD-10-CM | POA: Diagnosis not present

## 2023-07-16 MED ORDER — AMOXICILLIN-POT CLAVULANATE 875-125 MG PO TABS: 1.0000 | ORAL_TABLET | Freq: Two times a day (BID) | ORAL | 0 refills | Status: DC

## 2023-07-16 NOTE — ED Triage Notes (Signed)
Patient c/o a productive cough with brown/green sputum, sore throat, body aches, headache, and nasal congestion x 6 days. Patient states he was seen 2 days ago and was prescribed Tylenol which isn't helping.

## 2023-07-16 NOTE — Discharge Instructions (Signed)
Start taking Augmentin twice daily for 7 days for sinus infection.  I recommend taking Mucinex for cough and congestion.  You can also alternate between Tylenol and ibuprofen every 4-6 hours as needed for pain and fever.  Return here if symptoms persist.  If you develop severe shortness of breath, chest pain, high fevers unrelieved by medication please seek immediate medical treatment in the ER.

## 2023-07-16 NOTE — ED Provider Notes (Signed)
[facility]    CSN: [id number] Arrival date & time: 07/16/23  0801      History   Chief Complaint Chief Complaint  Patient presents with   URI    flu - Entered by patient    HPI [name] is a 63 y.o. male.   Patient presents with productive cough, sore throat, body aches, headache, and nasal congestion x 6 days.  Patient was seen here 2 days ago for URI and has been taking Tylenol without relief.  Patient reports brown/green sputum and mucus.   URI Presenting symptoms: congestion, cough, fatigue, fever and sore throat   Associated symptoms: headaches   Associated symptoms: no wheezing     Past Medical History:  Diagnosis Date   Alcohol dependence ([name])    Allergies    Arthritis    Chest pain    Chronic lower back pain    Chronic pain of right wrist    Cocaine abuse ([name])    per note on 01/18/23   Coronary artery disease    a. Multiple prior caths/PCI. Cath 2013 with possible spasm of RCA, 70% ISR of mid LCx with subsequent DES to mLCx and prox LCX. b. H/o microvascular angina. c. Recurrent angina 08/2014 - s/p PTCA/DES to prox Cx, PTCA/CBA to OM1.  c. LHC 06/10/15 with patent stents and some ISR in LCX and OM-1 that was not flow limiting --> Rx    Dyslipidemia    a. Intolerant to many statins except tolerating Livalo.   GERD (gastroesophageal reflux disease)    H/O cardiac catheterization 10/25/2018   Heart attack [facility])    Hypertension    Myocardial infarction [facility]) ~ 2010   Renal mass    S/P angioplasty with stent, DES, to proximal and mid LCX 12/15/11 12/15/2011   S/P foot surgery, right 04/2021   Shoulder pain    Stroke [facility])    pt. reports had a stroke around time of MI 2010   Type II diabetes mellitus ([name])    Unstable angina [facility])     Patient Active Problem List   Diagnosis Date Noted   Impingement syndrome of right shoulder 07/01/2023   Pain in right shoulder 06/16/2023   Anxiety disorder, unspecified 02/16/2023   Alcohol use  disorder 02/16/2023   Sleep disorder, unspecified 02/16/2023   Alcohol abuse 02/15/2023   Alcohol dependence ([name]) 05/16/2022   Low energy 03/06/2022   Moderate alcohol dependence in early remission ([name]) 02/24/2022   Suicidal ideation 07/07/2021   Generalized anxiety disorder 07/07/2021   Other allergic rhinitis 09/09/2020   Adverse reaction to food, subsequent encounter 09/09/2020   History of bee sting allergy 09/09/2020   Alcohol dependence with alcohol-induced mood disorder ([name]) 03/19/2020   MDD (major depressive disorder), recurrent episode, severe ([name]) 03/18/2020   History of substance abuse ([name]) 01/04/2020   Severe 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 features ([name]) 12/10/2019   Tinea cruris 07/06/2019   Hemoglobin A1c less than 7.0% 07/06/2019   Muscle strain of wrist 04/05/2019   Pain in both wrists 04/05/2019   Anxiety 04/05/2019   Prediabetes 04/05/2019   MDD (major depressive disorder) 01/12/2019   Chest pain 10/25/2018   MDD (major depressive disorder), recurrent severe, without psychosis ([name]) 08/15/2018   Influenza A 07/17/2017   Lactic acidosis    Cough    Nausea and vomiting    Severe episode of recurrent major depressive disorder, without psychotic features ([name])    MDD (major depressive disorder), single episode, severe  with psychotic features ([name]) 06/27/2017   MDD (major depressive disorder), severe ([name]) 06/23/2017   Essential hypertension 01/13/2016   Unstable angina [facility])    ED (erectile dysfunction) 02/15/2014   Atypical chest pain 12/14/2011   Allergy to contrast media (used for diagnostic x-rays) 05/12/2011   Dyslipidemia, goal LDL below 70 05/09/2011   DM2 (diabetes mellitus, type 2) ([name]) 07/10/2010   CAD S/P percutaneous coronary angioplasty 07/10/2010    Past Surgical History:  Procedure Laterality Date   CARDIAC CATHETERIZATION  06/15/2002   LAD with prox 40% stenosis, norma L main, Cfx with 25% lesion, RCA with long mid 25%  stenosis (Dr. Daiva Nakayama)   CARDIAC CATHETERIZATION  04/01/2010   normal L main, LAD wit mild stenosis, L Cfx with 70% in-stent restenosis, RCA with 70% in-stent restenosis, LVEF >60% (Dr. K. Italy Hilty) - cutting ballon arthrectomy to RCA & Cfx (Dr. Langston Reusing)   CARDIAC CATHETERIZATION  08/25/2010   preserved global LV contractility; multivessel CAD, diffuse 90-95% in-stent restenosis in prox placed Cfx stent - cutting balloon arthrectomy in Cfx with multiple dilatations 90-95% to 0% stenosis (Dr. Bishop Limbo)   CARDIAC CATHETERIZATION  01/26/2011   PCI & stenting of aggresive in-stent restenosis within previously stented AV groove Cfx with 3.0x55mm Taxus DES (previous stents were Promus) (Dr. Erlene Quan)   CARDIAC CATHETERIZATION  05/11/2011   preserved LV function, 40% mid LAD stenosis, 30-40% narrowing proximal to stented semgnet of prox Cfx, patent mid RCA stent with smooth 20% narrowing in distal RCA (Dr. Bishop Limbo)   CARDIAC CATHETERIZATION  12/15/2011   PCI & stenting of proximal & mid Cfx with DES - 3.0x56mm in proximal, 3.0x60mm in mid (Dr. Erlene Quan)   CARDIAC CATHETERIZATION N/A 06/10/2015   Procedure: Left Heart Cath and Coronary Angiography;  Surgeon: Dolores Patty, MD;  Location: [facility] INVASIVE CV LAB;  Service: Cardiovascular;  Laterality: N/A;   CARDIAC CATHETERIZATION  10/25/2018   cardiometabolic testing  08/08/2012   good exercise effort, peak VO2 79% predicted with normal VO2 HR curves (mild deconditioning)   COLONOSCOPY  12/2012   diminutive hyperplastic sigmoid poyp so repeat routine 2024   CORONARY BALLOON ANGIOPLASTY N/A 10/25/2018   Procedure: CORONARY BALLOON ANGIOPLASTY;  Surgeon: Corky Crafts, MD;  Location: [facility] INVASIVE CV LAB;  Service: Cardiovascular;  Laterality: N/A;   CORONARY BALLOON ANGIOPLASTY N/A 09/29/2019   Procedure: CORONARY BALLOON ANGIOPLASTY;  Surgeon: Yvonne Kendall, MD;  Location: [facility] INVASIVE CV LAB;  Service: Cardiovascular;  Laterality: N/A;    CORONARY ULTRASOUND/IVUS N/A 09/29/2019   Procedure: Intravascular Ultrasound/IVUS;  Surgeon: Yvonne Kendall, MD;  Location: [facility] INVASIVE CV LAB;  Service: Cardiovascular;  Laterality: N/A;   EXCISIONAL HEMORRHOIDECTOMY  1984   LEFT HEART CATH AND CORONARY ANGIOGRAPHY N/A 10/25/2018   Procedure: LEFT HEART CATH AND CORONARY ANGIOGRAPHY;  Surgeon: Corky Crafts, MD;  Location: [facility]VAS[facility] CV LAB;  Service: Cardiovascular;  Laterality: N/A;   LEFT HEART CATH AND CORONARY ANGIOGRAPHY N/A 09/29/2019   Procedure: LEFT HEART CATH AND CORONARY ANGIOGRAPHY;  Surgeon: Yvonne Kendall, MD;  Location: [facility];  Service: Cardiovascular;  Laterality: N/A;   LEFT HEART CATHETERIZATION WITH CORONARY ANGIOGRAM N/A 05/11/2011   Procedure: LEFT HEART CATHETERIZATION WITH CORONARY ANGIOGRAM;  Surgeon: Lennette Bihari, MD;  Location: [facility] CATH LAB;  Service: Cardiovascular;  Laterality: N/A;  Possible percutaneous coronary intervention, possible IVUS   LEFT HEART CATHETERIZATION WITH CORONARY ANGIOGRAM N/A 12/15/2011   Procedure: LEFT HEART CATHETERIZATION WITH CORONARY ANGIOGRAM;  Surgeon: Runell Gess, MD;  Location: [facility] CATH LAB;  Service: Cardiovascular;  Laterality: N/A;   LEFT HEART CATHETERIZATION WITH CORONARY ANGIOGRAM N/A 09/05/2014   Procedure: LEFT HEART CATHETERIZATION WITH CORONARY ANGIOGRAM;  Surgeon: Kathleene Hazel, MD;  Location: [facility] CATH LAB;  Service: Cardiovascular;  Laterality: N/A;   LIPOMA EXCISION     back of the head   NM MYOCAR PERF WALL MOTION  02/2012   lexiscan myoview; mild perfusion defect in mid inferolateral & basal inferolateral region (infarct/scar); EF 52%, abnormal but ow risk scan   PERCUTANEOUS CORONARY STENT INTERVENTION (PCI-S)  09/05/2014   Procedure: PERCUTANEOUS CORONARY STENT INTERVENTION (PCI-S);  Surgeon: Kathleene Hazel, MD;  Location: [facility] CATH LAB;  Service: Cardiovascular;;   TONSILLECTOMY         Home Medications    Prior to Admission  medications   Medication Sig Start Date End Date Taking? Authorizing Provider  amoxicillin-clavulanate (AUGMENTIN) 875-125 MG tablet Take 1 tablet by mouth every 12 (twelve) hours. 07/16/23  Yes Wynonia Lawman A, NP  ACCU-CHEK GUIDE TEST test strip USE 1 STRIP TO CHECK GLUCOSE THREE TIMES DAILY AS DIRECTED 07/01/23   Ivonne Andrew, NP  amLODipine (NORVASC) 10 MG tablet Take 1 tablet (10 mg total) by mouth daily. 05/20/23   Ivonne Andrew, NP  aspirin EC 81 MG tablet Take by mouth.    [provider]  Cholecalciferol 25 MCG (1000 UT) tablet Take 1,000 Units by mouth daily.    [provider]  clopidogrel (PLAVIX) 75 MG tablet Take 1 tablet (75 mg total) by mouth daily. 02/15/23   Ivonne Andrew, NP  cyclobenzaprine (FLEXERIL) 10 MG tablet Take 1 tablet by mouth 3 times daily as needed for muscle spasm. Warning: May cause drowsiness. 06/03/23   Mardella Layman, MD  EPINEPHrine 0.3 mg/0.3 mL IJ SOAJ injection Inject 0.3 mg into the muscle as needed for anaphylaxis. 05/07/21   Verlee Monte, MD  ezetimibe (ZETIA) 10 MG tablet Take 1 tablet (10 mg total) by mouth daily. 05/20/23   Ivonne Andrew, NP  LIVALO 4 MG TABS Take 1 tablet by mouth once daily 04/07/23   Hilty, Lisette Abu, MD  metFORMIN (GLUCOPHAGE-XR) 500 MG 24 hr tablet Take 1 tablet (500 mg total) by mouth daily with breakfast. 05/20/23 08/18/23  Ivonne Andrew, NP  metoprolol tartrate (LOPRESSOR) 100 MG tablet Take 1 tablet (100 mg total) by mouth 2 (two) times daily. NEED OV. 05/20/23   Ivonne Andrew, NP  Multiple Vitamin (MULTIVITAMIN WITH MINERALS) TABS tablet Take 1 tablet by mouth daily. 08/13/22   Park Pope, MD  nitroGLYCERIN (NITROSTAT) 0.4 MG SL tablet Place 1 tablet (0.4 mg total) under the tongue every 5 (five) minutes x 3 doses as needed for chest pain. 07/18/21   Hilty, Lisette Abu, MD  Polyethyl Glycol-Propyl Glycol (SYSTANE) 0.4-0.3 % GEL ophthalmic gel Place 1 Application into both eyes daily as needed (For  dry eyes).    [provider]  predniSONE (DELTASONE) 20 MG tablet Take 2 tablets (40 mg total) by mouth daily. Patient not taking: Reported on 07/15/2023 06/03/23   Mardella Layman, MD    Family History Family History  Problem Relation Age of Onset   Leukemia Mother    Prostate cancer Father    Cancer Brother    Coronary artery disease Paternal Grandmother    Cancer Paternal Grandfather    Migraines Neg Hx    Headache Neg Hx    Colon  cancer Neg Hx    Esophageal cancer Neg Hx    Rectal cancer Neg Hx    Stomach cancer Neg Hx     Social History Social History   Tobacco Use   Smoking status: Former    Current packs/day: 0.00    Average packs/day: 1 pack/day for 10.0 years (10.0 ttl pk-yrs)    Types: Cigarettes    Start date: 10/06/2008    Quit date: 10/07/2018    Years since quitting: 4.7   Smokeless tobacco: Never  Vaping Use   Vaping status: Never Used  Substance Use Topics   Alcohol use: Not Currently    Comment: 05/28/2023-stopped drinking 22 days ago.   Drug use: Not Currently    Types: Cocaine     Allergies   Bee venom, Gadolinium, Shellfish allergy, Statins, and Testosterone cypionate   Review of Systems Review of Systems  Constitutional:  Positive for chills, fatigue and fever.  HENT:  Positive for congestion, sinus pressure and sore throat.   Eyes:  Negative for photophobia.  Respiratory:  Positive for cough. Negative for chest tightness, shortness of breath and wheezing.   Cardiovascular:  Negative for chest pain.  Gastrointestinal:  Negative for abdominal pain, diarrhea, nausea and vomiting.  Neurological:  Positive for headaches. Negative for dizziness and weakness.     Physical Exam Triage Vital Signs ED Triage Vitals  Encounter Vitals Group     BP 07/16/23 0813 131/89     Systolic BP Percentile --      Diastolic BP Percentile --      Pulse Rate 07/16/23 0813 (!) 102     Resp 07/16/23 0813 16     Temp 07/16/23 0813 98.1 F (36.7 C)      Temp Source 07/16/23 0813 Oral     SpO2 07/16/23 0813 96 %     Weight --      Height --      Head Circumference --      Peak Flow --      Pain Score 07/16/23 0815 3     Pain Loc --      Pain Education --      Exclude from Growth Chart --    No data found.  Updated Vital Signs BP 131/89 (BP Location: Left Arm)   Pulse (!) 102   Temp 98.1 F (36.7 C) (Oral)   Resp 16   SpO2 96%   Visual Acuity Right Eye Distance:   Left Eye Distance:   Bilateral Distance:    Right Eye Near:   Left Eye Near:    Bilateral Near:     Physical Exam Vitals and nursing note reviewed.  Constitutional:      General: He is awake. He is not in acute distress.    Appearance: Normal appearance. He is well-developed and well-groomed. He is not ill-appearing.  HENT:     Right Ear: Tympanic membrane, ear canal and external ear normal.     Left Ear: Tympanic membrane, ear canal and external ear normal.     Nose: Congestion and rhinorrhea present.     Right Sinus: Maxillary sinus tenderness present.     Left Sinus: Maxillary sinus tenderness present.     Mouth/Throat:     Mouth: Mucous membranes are moist.     Pharynx: Posterior oropharyngeal erythema and postnasal drip present. No oropharyngeal exudate.     Tonsils: No tonsillar exudate.  Cardiovascular:     Rate and Rhythm: Normal rate and regular rhythm.  Pulmonary:     Effort: Pulmonary effort is normal.     Breath sounds: Normal breath sounds.  Skin:    General: Skin is warm and dry.  Neurological:     General: No focal deficit present.     Mental Status: He is alert and oriented to person, place, and time.  Psychiatric:        Behavior: Behavior is cooperative.      UC Treatments / Results  Labs (all labs ordered are listed, but only abnormal results are displayed) Labs Reviewed - No data to display  EKG   Radiology No results found.  Procedures Procedures (including critical care time)  Medications Ordered in  UC Medications - No data to display  Initial Impression / Assessment and Plan / UC Course  I have reviewed the triage vital signs and the nursing notes.  Pertinent labs & imaging results that were available during my care of the patient were reviewed by me and considered in my medical decision making (see chart for details).     Patient presented with 6-day history of productive cough with brown/green sputum and mucus, sore throat, body aches, headache, and nasal congestion.  Patient was seen here in urgent care 2 days ago for URI.   Upon assessment congestion and rhinorrhea present, bilateral maxillary sinus tenderness, erythema and postnasal drip noted to pharynx.  Lungs clear bilaterally on auscultation.  Prescribed Augmentin for sinusitis.  Recommended over-the-counter medication for symptoms.  Discussed return and emergency department precautions. Final Clinical Impressions(s) / UC Diagnoses   Final diagnoses:  Acute non-recurrent maxillary sinusitis     Discharge Instructions      Start taking Augmentin twice daily for 7 days for sinus infection.  I recommend taking Mucinex for cough and congestion.  You can also alternate between Tylenol and ibuprofen every 4-6 hours as needed for pain and fever.  Return here if symptoms persist.  If you develop severe shortness of breath, chest pain, high fevers unrelieved by medication please seek immediate medical treatment in the ER.    ED Prescriptions     Medication Sig Dispense Auth. Provider   amoxicillin-clavulanate (AUGMENTIN) 875-125 MG tablet Take 1 tablet by mouth every 12 (twelve) hours. 14 tablet Wynonia Lawman A, NP      PDMP not reviewed this encounter.   Wynonia Lawman A, NP 07/16/23 [phone]

## 2023-07-19 ENCOUNTER — Ambulatory Visit (HOSPITAL_COMMUNITY): Payer: MEDICAID

## 2023-07-21 ENCOUNTER — Ambulatory Visit (HOSPITAL_COMMUNITY): Payer: MEDICAID

## 2023-07-21 NOTE — Therapy (Deleted)
OUTPATIENT PHYSICAL THERAPY SHOULDER EVALUATION   Patient Name: [name] MRN: [id number] DOB:09-11-1960, 63 y.o., male Today's Date: 07/21/2023  END OF SESSION:    Past Medical History:  Diagnosis Date   Alcohol dependence ([name])    Allergies    Arthritis    Chest pain    Chronic lower back pain    Chronic pain of right wrist    Cocaine abuse ([name])    per note on 01/18/23   Coronary artery disease    a. Multiple prior caths/PCI. Cath 2013 with possible spasm of RCA, 70% ISR of mid LCx with subsequent DES to mLCx and prox LCX. b. H/o microvascular angina. c. Recurrent angina 08/2014 - s/p PTCA/DES to prox Cx, PTCA/CBA to OM1.  c. LHC 06/10/15 with patent stents and some ISR in LCX and OM-1 that was not flow limiting --> Rx    Dyslipidemia    a. Intolerant to many statins except tolerating Livalo.   GERD (gastroesophageal reflux disease)    H/O cardiac catheterization 10/25/2018   Heart attack [facility])    Hypertension    Myocardial infarction [facility]) ~ 2010   Renal mass    S/P angioplasty with stent, DES, to proximal and mid LCX 12/15/11 12/15/2011   S/P foot surgery, right 04/2021   Shoulder pain    Stroke ([name])    pt. reports had a stroke around time of MI 2010   Type II diabetes mellitus ([name])    Unstable angina [facility])    Past Surgical History:  Procedure Laterality Date   CARDIAC CATHETERIZATION  06/15/2002   LAD with prox 40% stenosis, norma L main, Cfx with 25% lesion, RCA with long mid 25% stenosis (Dr. Daiva Nakayama)   CARDIAC CATHETERIZATION  04/01/2010   normal L main, LAD wit mild stenosis, L Cfx with 70% in-stent restenosis, RCA with 70% in-stent restenosis, LVEF >60% (Dr. K. Italy Hilty) - cutting ballon arthrectomy to RCA & Cfx (Dr. Langston Reusing)   CARDIAC CATHETERIZATION  08/25/2010   preserved global LV contractility; multivessel CAD, diffuse 90-95% in-stent restenosis in prox placed Cfx stent - cutting balloon arthrectomy in Cfx with multiple dilatations 90-95% to 0%  stenosis (Dr. Bishop Limbo)   CARDIAC CATHETERIZATION  01/26/2011   PCI & stenting of aggresive in-stent restenosis within previously stented AV groove Cfx with 3.0x36mm Taxus DES (previous stents were Promus) (Dr. Erlene Quan)   CARDIAC CATHETERIZATION  05/11/2011   preserved LV function, 40% mid LAD stenosis, 30-40% narrowing proximal to stented semgnet of prox Cfx, patent mid RCA stent with smooth 20% narrowing in distal RCA (Dr. Bishop Limbo)   CARDIAC CATHETERIZATION  12/15/2011   PCI & stenting of proximal & mid Cfx with DES - 3.0x51mm in proximal, 3.0x41mm in mid (Dr. Erlene Quan)   CARDIAC CATHETERIZATION N/A 06/10/2015   Procedure: Left Heart Cath and Coronary Angiography;  Surgeon: Dolores Patty, MD;  Location: [facility] INVASIVE CV LAB;  Service: Cardiovascular;  Laterality: N/A;   CARDIAC CATHETERIZATION  10/25/2018   cardiometabolic testing  08/08/2012   good exercise effort, peak VO2 79% predicted with normal VO2 HR curves (mild deconditioning)   COLONOSCOPY  12/2012   diminutive hyperplastic sigmoid poyp so repeat routine 2024   CORONARY BALLOON ANGIOPLASTY N/A 10/25/2018   Procedure: CORONARY BALLOON ANGIOPLASTY;  Surgeon: Corky Crafts, MD;  Location: [facility] INVASIVE CV LAB;  Service: Cardiovascular;  Laterality: N/A;   CORONARY BALLOON ANGIOPLASTY N/A 09/29/2019   Procedure: CORONARY BALLOON ANGIOPLASTY;  Surgeon: Yvonne Kendall,  MD;  Location: [facility] INVASIVE CV LAB;  Service: Cardiovascular;  Laterality: N/A;   CORONARY ULTRASOUND/IVUS N/A 09/29/2019   Procedure: Intravascular Ultrasound/IVUS;  Surgeon: Yvonne Kendall, MD;  Location: [facility] INVASIVE CV LAB;  Service: Cardiovascular;  Laterality: N/A;   EXCISIONAL HEMORRHOIDECTOMY  1984   LEFT HEART CATH AND CORONARY ANGIOGRAPHY N/A 10/25/2018   Procedure: LEFT HEART CATH AND CORONARY ANGIOGRAPHY;  Surgeon: Corky Crafts, MD;  Location: [facility] INVASIVE CV LAB;  Service: Cardiovascular;  Laterality: N/A;   LEFT HEART CATH AND CORONARY ANGIOGRAPHY  N/A 09/29/2019   Procedure: LEFT HEART CATH AND CORONARY ANGIOGRAPHY;  Surgeon: Yvonne Kendall, MD;  Location: [facility] INVASIVE CV LAB;  Service: Cardiovascular;  Laterality: N/A;   LEFT HEART CATHETERIZATION WITH CORONARY ANGIOGRAM N/A 05/11/2011   Procedure: LEFT HEART CATHETERIZATION WITH CORONARY ANGIOGRAM;  Surgeon: Lennette Bihari, MD;  Location[facility] CATH LAB;  Service: Cardiovascular;  Laterality: N/A;  Possible percutaneous coronary intervention, possible IVUS   LEFT HEART CATHETERIZATION WITH CORONARY ANGIOGRAM N/A 12/15/2011   Procedure: LEFT HEART CATHETERIZATION WITH CORONARY ANGIOGRAM;  Surgeon: Runell Gess, MD;  Location: [facility] CATH LAB;  Service: Cardiovascular;  Laterality: N/A;   LEFT HEART CATHETERIZATION WITH CORONARY ANGIOGRAM N/A 09/05/2014   Procedure: LEFT HEART CATHETERIZATION WITH CORONARY ANGIOGRAM;  Surgeon: Kathleene Hazel, MD;  Location: [facility] CATH LAB;  Service: Cardiovascular;  Laterality: N/A;   LIPOMA EXCISION     back of the head   NM MYOCAR PERF WALL MOTION  02/2012   lexiscan myoview; mild perfusion defect in mid inferolateral & basal inferolateral region (infarct/scar); EF 52%, abnormal but ow risk scan   PERCUTANEOUS CORONARY STENT INTERVENTION (PCI-S)  09/05/2014   Procedure: PERCUTANEOUS CORONARY STENT INTERVENTION (PCI-S);  Surgeon: Kathleene Hazel, MD;  Location: [facility] CATH LAB;  Service: Cardiovascular;;   TONSILLECTOMY     Patient Active Problem List   Diagnosis Date Noted   Impingement syndrome of right shoulder 07/01/2023   Pain in right shoulder 06/16/2023   Anxiety disorder, unspecified 02/16/2023   Alcohol use disorder 02/16/2023   Sleep disorder, unspecified 02/16/2023   Alcohol abuse 02/15/2023   Alcohol dependence ([name]) 05/16/2022   Low energy 03/06/2022   Moderate alcohol dependence in early remission ([name]) 02/24/2022   Suicidal ideation 07/07/2021   Generalized anxiety disorder 07/07/2021   Other allergic rhinitis 09/09/2020   Adverse  reaction to food, subsequent encounter 09/09/2020   History of bee sting allergy 09/09/2020   Alcohol dependence with alcohol-induced mood disorder ([name]) 03/19/2020   MDD (major depressive disorder), recurrent episode, severe ([name]) 03/18/2020   History of substance abuse ([name]) 01/04/2020   Severe 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 features ([name]) 12/10/2019   Tinea cruris 07/06/2019   Hemoglobin A1c less than 7.0% 07/06/2019   Muscle strain of wrist 04/05/2019   Pain in both wrists 04/05/2019   Anxiety 04/05/2019   Prediabetes 04/05/2019   MDD (major depressive disorder) 01/12/2019   Chest pain 10/25/2018   MDD (major depressive disorder), recurrent severe, without psychosis ([name]) 08/15/2018   Influenza A 07/17/2017   Lactic acidosis    Cough    Nausea and vomiting    Severe episode of recurrent major depressive disorder, without psychotic features ([name])    MDD (major depressive disorder), single episode, severe with psychotic features ([name]) 06/27/2017   MDD (major depressive disorder), severe ([name]) 06/23/2017   Essential hypertension 01/13/2016   Unstable angina ([name])    ED (erectile dysfunction) 02/15/2014   Atypical chest pain 12/14/2011  Allergy to contrast media (used for diagnostic x-rays) 05/12/2011   Dyslipidemia, goal LDL below 70 05/09/2011   DM2 (diabetes mellitus, type 2) ([name]) 07/10/2010   CAD S/P percutaneous coronary angioplasty 07/10/2010    PCP: Angus Seller NP   REFERRING PROVIDER: Persons, [facility]   REFERRING DIAG: Rt shoulder pain , impingement   THERAPY DIAG:  No diagnosis found.  Rationale for Evaluation and Treatment: Rehabilitation  ONSET DATE: 2-3 mos   SUBJECTIVE:                                                                                                                                                                                      SUBJECTIVE STATEMENT: Patient presents with right-sided shoulder pain of less than 3 months  duration.  He reports pain initially started as he was seated and working on his computer reaching across his body multiple times.  The pain wakes him up at night.  He reports it is hard to lift and rack his bike, complete ADLs bathing dressing and hygiene without increased shoulder pain.  He has not had physical therapy for the shoulder before and has never had surgery.  The right arm feels weak functionally but he does not drop items or have any weakness in his grip.  His pain does not radiate.  Recently had an injection posterior right shoulder but he reports no improvement or change. Hand dominance: Right  PERTINENT HISTORY: MI, multiple cardiac catheterizations  PAIN:  Are you having pain? Yes: NPRS scale: none at rest , is severe briefly when using to lift , 10/10  Pain location: Rt  lateral shoulder  Pain description: sharp, achy  Aggravating factors: using it  Relieving factors: rest, ice pack/hot shower   PRECAUTIONS: None  RED FLAGS: None   WEIGHT BEARING RESTRICTIONS: No  FALLS:  Has patient fallen in last 6 months? 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  DIAGNOSTIC FINDINGS:  None  PATIENT SURVEYS:  FOTO 53%  COGNITION: Overall cognitive status: Within functional limits for tasks assessed     SENSATION: WFL  POSTURE: Good sitting posture without cues   UPPER EXTREMITY MMT   MMT Right eval Left eval  Shoulder flexion 4+ pain  5  Shoulder extension    Shoulder  abduction 4+ pain  5  Shoulder adduction    Shoulder internal rotation 5     Shoulder external rotation 5 pain    Elbow flexion    Elbow extension    Wrist flexion    Wrist extension    Wrist ulnar deviation    Wrist radial deviation    Wrist pronation    Wrist supination    (Blank rows = not tested)  UPPER EXTREMITY ROM   AROM  Right eval Left eval  Shoulder flexion 165   Shoulder extension 160   Shoulder abduction    Shoulder adduction    Shoulder internal rotation Functional reach toT 7   Shoulder external rotation Functional reach to T3, full in supine with arm at 90 deg abd   Grip strength (lbs) 83 pounds 79 pounds  (Blank rows = not tested)  SHOULDER SPECIAL TESTS: Impingement tests: Neer impingement test: positive  Pain with cross body adduction  JOINT MOBILITY TESTING:  Generalized joint stiffness in all directions of right glenohumeral motion   PALPATION:  Min TTP to the right anterolateral aspect of shoulder, no crepitus                                                                                                                              TREATMENT DATE: 07/15/2023 Patient performed program prescribed: Green Thera-Band bilateral horizontal abduction, external rotation and right-sided scaption and side-lying  PATIENT EDUCATION: Education details: Plan of care, impingement versus tendinitis, posture rotator cuff and scapula model, home program Person educated: Patient Education method: Explanation, Demonstration, Verbal cues, and Handouts Education comprehension: verbalized understanding and needs further education  HOME EXERCISE PROGRAM: Access Code: [email or web address] URL: [email or web address] Date: 07/15/2023 Prepared by: Karie Mainland  Exercises - Shoulder External Rotation and Scapular Retraction with Resistance  - 1-2 x daily - 7 x weekly - 2 sets - 10 reps - 5 hold - Sidelying Shoulder Abduction with Resistance to 60 Degrees  - 1-2 x daily - 7 x weekly - 2 sets - 10 reps - 5 hold - Standing Shoulder Horizontal  Abduction with Resistance  - 1-2 x daily - 7 x weekly - 2 sets - 10 reps - 5 hold  ASSESSMENT:  CLINICAL IMPRESSION: Patient is a 62y.o. 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   OBJECTIVE IMPAIRMENTS: decreased mobility, decreased ROM, decreased strength, increased fascial restrictions, impaired flexibility, impaired UE functional use, and pain.   ACTIVITY LIMITATIONS: carrying, lifting, sleeping, reach over head, and hygiene/grooming  PARTICIPATION LIMITATIONS: interpersonal relationship and community activity  PERSONAL FACTORS: Past/current experiences and 1 comorbidity: Cardiac issues  are also affecting patient's functional outcome.   REHAB POTENTIAL: Excellent  CLINICAL DECISION MAKING: Unstable/unpredictable  EVALUATION COMPLEXITY: Low   GOALS: Goals reviewed with patient? Yes  LONG TERM GOALS: Target date: 08/26/2023    Patient will  be independent with home exercise program for right upper extremity Baseline:  Goal status: INITIAL  2.  Patient will increase right shoulder strength to 5 out of 5 in all planes of motion without increased pain Baseline:  Goal status: INITIAL  3.  Patient will be able to rack his bike and city bus without increased pain 75% of the time Baseline:  Goal status: INITIAL  4.  FOTO score will improve to 59% in order to demonstrate improved functional mobility Baseline: 53% Goal status: INITIAL   PLAN:  PT FREQUENCY: 1x/week  PT DURATION: 6 weeks  PLANNED INTERVENTIONS: 97164- PT Re-evaluation, 97110-Therapeutic exercises, 97530- Therapeutic activity, 97112- Neuromuscular re-education, 97535- Self Care, 16109- Manual therapy, Taping, Dry Needling, Joint mobilization, Cryotherapy, and Moist heat  PLAN FOR NEXT SESSION: HEP check, add on as tolerated, review lifting technique for  racking his bike    Kasten Leveque, PT 07/21/2023, 8:05 AM   Karie Mainland, PT 07/21/23 8:05 AM Phone: [phone] Fax: [phone]   For all possible CPT codes, reference the Planned Interventions line above.     Check all conditions that are expected to impact treatment: {Conditions expected to impact treatment:Diabetes mellitus and Social determinants of health   If treatment provided at initial evaluation, no treatment charged due to lack of authorization.

## 2023-07-22 ENCOUNTER — Ambulatory Visit: Payer: MEDICAID | Admitting: Physical Therapy

## 2023-07-22 ENCOUNTER — Encounter: Payer: Self-pay | Admitting: Medical

## 2023-07-23 ENCOUNTER — Ambulatory Visit (HOSPITAL_COMMUNITY): Payer: MEDICAID

## 2023-07-26 ENCOUNTER — Ambulatory Visit (HOSPITAL_COMMUNITY): Payer: MEDICAID

## 2023-07-28 ENCOUNTER — Ambulatory Visit (HOSPITAL_COMMUNITY): Payer: MEDICAID | Admitting: Student

## 2023-07-28 NOTE — Therapy (Deleted)
 OUTPATIENT PHYSICAL THERAPY SHOULDER EVALUATION   Patient Name: [name] MRN: [id number] DOB:1960/10/14, 63 y.o., male Today's Date: 07/28/2023  END OF SESSION:    Past Medical History:  Diagnosis Date   Alcohol dependence ([name])    Allergies    Arthritis    Chest pain    Chronic lower back pain    Chronic pain of right wrist    Cocaine abuse ([name])    per note on 01/18/23   Coronary artery disease    a. Multiple prior caths/PCI. Cath 2013 with possible spasm of RCA, 70% ISR of mid LCx with subsequent DES to mLCx and prox LCX. b. H/o microvascular angina. c. Recurrent angina 08/2014 - s/p PTCA/DES to prox Cx, PTCA/CBA to OM1.  c. LHC 06/10/15 with patent stents and some ISR in LCX and OM-1 that was not flow limiting --> Rx    Dyslipidemia    a. Intolerant to many statins except tolerating Livalo.   GERD (gastroesophageal reflux disease)    H/O cardiac catheterization 10/25/2018   Heart attack [facility])    Hypertension    Myocardial infarction [facility]) ~ 2010   Renal mass    S/P angioplasty with stent, DES, to proximal and mid LCX 12/15/11 12/15/2011   S/P foot surgery, right 04/2021   Shoulder pain    Stroke ([name])    pt. reports had a stroke around time of MI 2010   Type II diabetes mellitus ([name])    Unstable angina [facility])    Past Surgical History:  Procedure Laterality Date   CARDIAC CATHETERIZATION  06/15/2002   LAD with prox 40% stenosis, norma L main, Cfx with 25% lesion, RCA with long mid 25% stenosis (Dr. Daiva Nakayama)   CARDIAC CATHETERIZATION  04/01/2010   normal L main, LAD wit mild stenosis, L Cfx with 70% in-stent restenosis, RCA with 70% in-stent restenosis, LVEF >60% (Dr. K. Italy Hilty) - cutting ballon arthrectomy to RCA & Cfx (Dr. Langston Reusing)   CARDIAC CATHETERIZATION  08/25/2010   preserved global LV contractility; multivessel CAD, diffuse 90-95% in-stent restenosis in prox placed Cfx stent - cutting balloon arthrectomy in Cfx with multiple dilatations 90-95% to 0%  stenosis (Dr. Bishop Limbo)   CARDIAC CATHETERIZATION  01/26/2011   PCI & stenting of aggresive in-stent restenosis within previously stented AV groove Cfx with 3.0x19mm Taxus DES (previous stents were Promus) (Dr. Erlene Quan)   CARDIAC CATHETERIZATION  05/11/2011   preserved LV function, 40% mid LAD stenosis, 30-40% narrowing proximal to stented semgnet of prox Cfx, patent mid RCA stent with smooth 20% narrowing in distal RCA (Dr. Bishop Limbo)   CARDIAC CATHETERIZATION  12/15/2011   PCI & stenting of proximal & mid Cfx with DES - 3.0x24mm in proximal, 3.0x79mm in mid (Dr. Erlene Quan)   CARDIAC CATHETERIZATION N/A 06/10/2015   Procedure: Left Heart Cath and Coronary Angiography;  Surgeon: Dolores Patty, MD;  Location: [facility] INVASIVE CV LAB;  Service: Cardiovascular;  Laterality: N/A;   CARDIAC CATHETERIZATION  10/25/2018   cardiometabolic testing  08/08/2012   good exercise effort, peak VO2 79% predicted with normal VO2 HR curves (mild deconditioning)   COLONOSCOPY  12/2012   diminutive hyperplastic sigmoid poyp so repeat routine 2024   CORONARY BALLOON ANGIOPLASTY N/A 10/25/2018   Procedure: CORONARY BALLOON ANGIOPLASTY;  Surgeon: Corky Crafts, MD;  Location: [facility] INVASIVE CV LAB;  Service: Cardiovascular;  Laterality: N/A;   CORONARY BALLOON ANGIOPLASTY N/A 09/29/2019   Procedure: CORONARY BALLOON ANGIOPLASTY;  Surgeon: Yvonne Kendall,  MD;  Location: [facility] INVASIVE CV LAB;  Service: Cardiovascular;  Laterality: N/A;   CORONARY ULTRASOUND/IVUS N/A 09/29/2019   Procedure: Intravascular Ultrasound/IVUS;  Surgeon: Yvonne Kendall, MD;  Location: [facility] INVASIVE CV LAB;  Service: Cardiovascular;  Laterality: N/A;   EXCISIONAL HEMORRHOIDECTOMY  1984   LEFT HEART CATH AND CORONARY ANGIOGRAPHY N/A 10/25/2018   Procedure: LEFT HEART CATH AND CORONARY ANGIOGRAPHY;  Surgeon: Corky Crafts, MD;  Location: [facility] INVASIVE CV LAB;  Service: Cardiovascular;  Laterality: N/A;   LEFT HEART CATH AND CORONARY ANGIOGRAPHY  N/A 09/29/2019   Procedure: LEFT HEART CATH AND CORONARY ANGIOGRAPHY;  Surgeon: Yvonne Kendall, MD;  Location: [facility] INVASIVE CV LAB;  Service: Cardiovascular;  Laterality: N/A;   LEFT HEART CATHETERIZATION WITH CORONARY ANGIOGRAM N/A 05/11/2011   Procedure: LEFT HEART CATHETERIZATION WITH CORONARY ANGIOGRAM;  Surgeon: Lennette Bihari, MD;  Location: [facility] CATH LAB;  Service: Cardiovascular;  Laterality: N/A;  Possible percutaneous coronary intervention, possible IVUS   LEFT HEART CATHETERIZATION WITH CORONARY ANGIOGRAM N/A 12/15/2011   Procedure: LEFT HEART CATHETERIZATION WITH CORONARY ANGIOGRAM;  Surgeon: Runell Gess, MD;  Location: [facility] CATH LAB;  Service: Cardiovascular;  Laterality: N/A;   LEFT HEART CATHETERIZATION WITH CORONARY ANGIOGRAM N/A 09/05/2014   Procedure: LEFT HEART CATHETERIZATION WITH CORONARY ANGIOGRAM;  Surgeon: Kathleene Hazel, MD;  Location: [facility] CATH LAB;  Service: Cardiovascular;  Laterality: N/A;   LIPOMA EXCISION     back of the head   NM MYOCAR PERF WALL MOTION  02/2012   lexiscan myoview; mild perfusion defect in mid inferolateral & basal inferolateral region (infarct/scar); EF 52%, abnormal but ow risk scan   PERCUTANEOUS CORONARY STENT INTERVENTION (PCI-S)  09/05/2014   Procedure: PERCUTANEOUS CORONARY STENT INTERVENTION (PCI-S);  Surgeon: Kathleene Hazel, MD;  Location: [facility] CATH LAB;  Service: Cardiovascular;;   TONSILLECTOMY     Patient Active Problem List   Diagnosis Date Noted   Impingement syndrome of right shoulder 07/01/2023   Pain in right shoulder 06/16/2023   Anxiety disorder, unspecified 02/16/2023   Alcohol use disorder 02/16/2023   Sleep disorder, unspecified 02/16/2023   Alcohol abuse 02/15/2023   Alcohol dependence ([name]) 05/16/2022   Low energy 03/06/2022   Moderate alcohol dependence in early remission ([name]) 02/24/2022   Suicidal ideation 07/07/2021   Generalized anxiety disorder 07/07/2021   Other allergic rhinitis 09/09/2020   Adverse  reaction to food, subsequent encounter 09/09/2020   History of bee sting allergy 09/09/2020   Alcohol dependence with alcohol-induced mood disorder ([name]) 03/19/2020   MDD (major depressive disorder), recurrent episode, severe ([name]) 03/18/2020   History of substance abuse ([name]) 01/04/2020   Severe 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 features ([name]) 12/10/2019   Tinea cruris 07/06/2019   Hemoglobin A1c less than 7.0% 07/06/2019   Muscle strain of wrist 04/05/2019   Pain in both wrists 04/05/2019   Anxiety 04/05/2019   Prediabetes 04/05/2019   MDD (major depressive disorder) 01/12/2019   Chest pain 10/25/2018   MDD (major depressive disorder), recurrent severe, without psychosis ([name]) 08/15/2018   Influenza A 07/17/2017   Lactic acidosis    Cough    Nausea and vomiting    Severe episode of recurrent major depressive disorder, without psychotic features ([name])    MDD (major depressive disorder), single episode, severe with psychotic features ([name]) 06/27/2017   MDD (major depressive disorder), severe ([name]) 06/23/2017   Essential hypertension 01/13/2016   Unstable angina ([name])    ED (erectile dysfunction) 02/15/2014   Atypical chest pain 12/14/2011  Allergy to contrast media (used for diagnostic x-rays) 05/12/2011   Dyslipidemia, goal LDL below 70 05/09/2011   DM2 (diabetes mellitus, type 2) ([name]) 07/10/2010   CAD S/P percutaneous coronary angioplasty 07/10/2010    PCP: Angus Seller NP   REFERRING PROVIDER: Persons, [facility]   REFERRING DIAG: Rt shoulder pain , impingement   THERAPY DIAG:  No diagnosis found.  Rationale for Evaluation and Treatment: Rehabilitation  ONSET DATE: 2-3 mos   SUBJECTIVE:                                                                                                                                                                                      SUBJECTIVE STATEMENT: Patient presents with right-sided shoulder pain of less than 3 months  duration.  He reports pain initially started as he was seated and working on his computer reaching across his body multiple times.  The pain wakes him up at night.  He reports it is hard to lift and rack his bike, complete ADLs bathing dressing and hygiene without increased shoulder pain.  He has not had physical therapy for the shoulder before and has never had surgery.  The right arm feels weak functionally but he does not drop items or have any weakness in his grip.  His pain does not radiate.  Recently had an injection posterior right shoulder but he reports no improvement or change. Hand dominance: Right  PERTINENT HISTORY: MI, multiple cardiac catheterizations  PAIN:  Are you having pain? Yes: NPRS scale: none at rest , is severe briefly when using to lift , 10/10  Pain location: Rt  lateral shoulder  Pain description: sharp, achy  Aggravating factors: using it  Relieving factors: rest, ice pack/hot shower   PRECAUTIONS: None  RED FLAGS: None   WEIGHT BEARING RESTRICTIONS: No  FALLS:  Has patient fallen in last 6 months? 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  DIAGNOSTIC FINDINGS:  None  PATIENT SURVEYS:  FOTO 53%  COGNITION: Overall cognitive status: Within functional limits for tasks assessed     SENSATION: WFL  POSTURE: Good sitting posture without cues   UPPER EXTREMITY MMT   MMT Right eval Left eval  Shoulder flexion 4+ pain  5  Shoulder extension    Shoulder  abduction 4+ pain  5  Shoulder adduction    Shoulder internal rotation 5     Shoulder external rotation 5 pain    Elbow flexion    Elbow extension    Wrist flexion    Wrist extension    Wrist ulnar deviation    Wrist radial deviation    Wrist pronation    Wrist supination    (Blank rows = not tested)  UPPER EXTREMITY ROM   AROM  Right eval Left eval  Shoulder flexion 165   Shoulder extension 160   Shoulder abduction    Shoulder adduction    Shoulder internal rotation Functional reach toT 7   Shoulder external rotation Functional reach to T3, full in supine with arm at 90 deg abd   Grip strength (lbs) 83 pounds 79 pounds  (Blank rows = not tested)  SHOULDER SPECIAL TESTS: Impingement tests: Neer impingement test: positive  Pain with cross body adduction  JOINT MOBILITY TESTING:  Generalized joint stiffness in all directions of right glenohumeral motion   PALPATION:  Min TTP to the right anterolateral aspect of shoulder, no crepitus                                                                                                                              TREATMENT DATE:    [facility] Adult PT Treatment:                                                DATE: 07/29/23 Therapeutic Exercise: *** Manual Therapy: *** Neuromuscular re-ed: *** Therapeutic Activity: *** Modalities: *** Self Care: ***  07/15/2023 Patient performed program prescribed: Green Thera-Band bilateral horizontal abduction, external rotation and right-sided scaption and side-lying  PATIENT EDUCATION: Education details: Plan of care, impingement versus tendinitis, posture rotator cuff and scapula model, home program Person educated: Patient Education method: Explanation, Demonstration, Verbal cues, and Handouts Education comprehension: verbalized understanding and needs further education  HOME EXERCISE PROGRAM: Access Code: [email or web address] URL: [email or web address] Date: 07/15/2023 Prepared by: Karie Mainland  Exercises - Shoulder External Rotation and Scapular  Retraction with Resistance  - 1-2 x daily - 7 x weekly - 2 sets - 10 reps - 5 hold - Sidelying Shoulder Abduction with Resistance to 60 Degrees  - 1-2 x daily - 7 x weekly - 2 sets - 10 reps - 5 hold - Standing Shoulder Horizontal Abduction with Resistance  - 1-2 x daily - 7 x weekly - 2 sets - 10 reps - 5 hold  ASSESSMENT:  CLINICAL IMPRESSION: Patient is a 62y.o. 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   OBJECTIVE IMPAIRMENTS: decreased mobility, decreased ROM, decreased strength, increased fascial restrictions, impaired flexibility, impaired UE functional  use, and pain.   ACTIVITY LIMITATIONS: carrying, lifting, sleeping, reach over head, and hygiene/grooming  PARTICIPATION LIMITATIONS: interpersonal relationship and community activity  PERSONAL FACTORS: Past/current experiences and 1 comorbidity: Cardiac issues  are also affecting patient's functional outcome.   REHAB POTENTIAL: Excellent  CLINICAL DECISION MAKING: Unstable/unpredictable  EVALUATION COMPLEXITY: Low   GOALS: Goals reviewed with patient? Yes  LONG TERM GOALS: Target date: 08/26/2023    Patient will be independent with home exercise program for right upper extremity Baseline:  Goal status: INITIAL  2.  Patient will increase right shoulder strength to 5 out of 5 in all planes of motion without increased pain Baseline:  Goal status: INITIAL  3.  Patient will be able to rack his bike and city bus without increased pain 75% of the time Baseline:  Goal status: INITIAL  4.  FOTO score will improve to 59% in order to demonstrate improved functional mobility Baseline: 53% Goal status: INITIAL   PLAN:  PT FREQUENCY: 1x/week  PT DURATION: 6 weeks  PLANNED INTERVENTIONS: 97164- PT Re-evaluation, 97110-Therapeutic exercises, 97530- Therapeutic activity,  97112- Neuromuscular re-education, 97535- Self Care, 16109- Manual therapy, Taping, Dry Needling, Joint mobilization, Cryotherapy, and Moist heat  PLAN FOR NEXT SESSION: HEP check, add on as tolerated, review lifting technique for racking his bike    Copper Kirtley, PT 07/28/2023, 4:26 PM   Karie Mainland, PT 07/28/23 4:26 PM Phone: [phone] Fax: [phone]   For all possible CPT codes, reference the Planned Interventions line above.     Check all conditions that are expected to impact treatment: {Conditions expected to impact treatment:Diabetes mellitus and Social determinants of health   If treatment provided at initial evaluation, no treatment charged due to lack of authorization.

## 2023-07-29 ENCOUNTER — Ambulatory Visit: Payer: MEDICAID | Admitting: Physical Therapy

## 2023-08-03 ENCOUNTER — Other Ambulatory Visit: Payer: Self-pay | Admitting: Nurse Practitioner

## 2023-08-05 ENCOUNTER — Ambulatory Visit: Payer: MEDICAID | Attending: Physician Assistant | Admitting: Physical Therapy

## 2023-08-05 ENCOUNTER — Encounter: Payer: Self-pay | Admitting: Physical Therapy

## 2023-08-05 DIAGNOSIS — M25511 Pain in right shoulder: Secondary | ICD-10-CM | POA: Insufficient documentation

## 2023-08-05 NOTE — Therapy (Signed)
 OUTPATIENT PHYSICAL THERAPY SHOULDER EVALUATION   Patient Name: [name] MRN: [id number] DOB:September 06, 1960, 63 y.o., male Today's Date: 08/05/2023  END OF SESSION:  PT End of Session - 08/05/23 0748     Visit Number 2    Number of Visits 7    Date for PT Re-Evaluation 08/26/23    Authorization Type [employer]    Authorization Time Period 1/20-3/8 , 6 visits    PT Start Time 0800    PT Stop Time 0842    PT Time Calculation (min) 42 min    Activity Tolerance Patient tolerated treatment well    Behavior During Therapy [facility] for tasks assessed/performed              Past Medical History:  Diagnosis Date   Alcohol  dependence ([name])    Allergies    Arthritis    Chest pain    Chronic lower back pain    Chronic pain of right wrist    Cocaine abuse ([name])    per note on 01/18/23   Coronary artery disease    a. Multiple prior caths/PCI. Cath 2013 with possible spasm of RCA, 70% ISR of mid LCx with subsequent DES to mLCx and prox LCX. b. H/o microvascular angina. c. Recurrent angina 08/2014 - s/p PTCA/DES to prox Cx, PTCA/CBA to OM1.  c. LHC 06/10/15 with patent stents and some ISR in LCX and OM-1 that was not flow limiting --> Rx    Dyslipidemia    a. Intolerant to many statins except tolerating Livalo .   GERD (gastroesophageal reflux disease)    H/O cardiac catheterization 10/25/2018   Heart attack [facility])    Hypertension    Myocardial infarction [facility]) ~ 2010   Renal mass    S/P angioplasty with stent, DES, to proximal and mid LCX 12/15/11 12/15/2011   S/P foot surgery, right 04/2021   Shoulder pain    Stroke ([name])    pt. reports had a stroke around time of MI 2010   Type II diabetes mellitus ([name])    Unstable angina ([name])    Past Surgical History:  Procedure Laterality Date   CARDIAC CATHETERIZATION  06/15/2002   LAD with prox 40% stenosis, norma L main, Cfx with 25% lesion, RCA with long mid 25% stenosis (Dr. DOROTHA Schilling)   CARDIAC CATHETERIZATION  04/01/2010    normal L main, LAD wit mild stenosis, L Cfx with 70% in-stent restenosis, RCA with 70% in-stent restenosis, LVEF >60% (Dr. K. Chad Hilty) - cutting ballon arthrectomy to RCA & Cfx (Dr. RONAL Ly)   CARDIAC CATHETERIZATION  08/25/2010   preserved global LV contractility; multivessel CAD, diffuse 90-95% in-stent restenosis in prox placed Cfx stent - cutting balloon arthrectomy in Cfx with multiple dilatations 90-95% to 0% stenosis (Dr. IVAR Sor)   CARDIAC CATHETERIZATION  01/26/2011   PCI & stenting of aggresive in-stent restenosis within previously stented AV groove Cfx with 3.0x31mm Taxus DES (previous stents were Promus) (Dr. DOROTHA Lesches)   CARDIAC CATHETERIZATION  05/11/2011   preserved LV function, 40% mid LAD stenosis, 30-40% narrowing proximal to stented semgnet of prox Cfx, patent mid RCA stent with smooth 20% narrowing in distal RCA (Dr. IVAR Sor)   CARDIAC CATHETERIZATION  12/15/2011   PCI & stenting of proximal & mid Cfx with DES - 3.0x92mm in proximal, 3.0x32mm in mid (Dr. DOROTHA Lesches)   CARDIAC CATHETERIZATION N/A 06/10/2015   Procedure: Left Heart Cath and Coronary Angiography;  Surgeon: Toribio JONELLE Fuel, MD;  Location: [facility] INVASIVE  CV LAB;  Service: Cardiovascular;  Laterality: N/A;   CARDIAC CATHETERIZATION  10/25/2018   cardiometabolic testing  08/08/2012   good exercise effort, peak VO2 79% predicted with normal VO2 HR curves (mild deconditioning)   COLONOSCOPY  12/2012   diminutive hyperplastic sigmoid poyp so repeat routine 2024   CORONARY BALLOON ANGIOPLASTY N/A 10/25/2018   Procedure: CORONARY BALLOON ANGIOPLASTY;  Surgeon: Dann Candyce RAMAN, MD;  Location: [facility] INVASIVE CV LAB;  Service: Cardiovascular;  Laterality: N/A;   CORONARY BALLOON ANGIOPLASTY N/A 09/29/2019   Procedure: CORONARY BALLOON ANGIOPLASTY;  Surgeon: Mady Bruckner, MD;  Location: [facility] INVASIVE CV LAB;  Service: Cardiovascular;  Laterality: N/A;   CORONARY ULTRASOUND/IVUS N/A 09/29/2019   Procedure: Intravascular  Ultrasound/IVUS;  Surgeon: Mady Bruckner, MD;  Location: [facility] INVASIVE CV LAB;  Service: Cardiovascular;  Laterality: N/A;   EXCISIONAL HEMORRHOIDECTOMY  1984   LEFT HEART CATH AND CORONARY ANGIOGRAPHY N/A 10/25/2018   Procedure: LEFT HEART CATH AND CORONARY ANGIOGRAPHY;  Surgeon: Dann Candyce RAMAN, MD;  Location: [facility] INVASIVE CV LAB;  Service: Cardiovascular;  Laterality: N/A;   LEFT HEART CATH AND CORONARY ANGIOGRAPHY N/A 09/29/2019   Procedure: LEFT HEART CATH AND CORONARY ANGIOGRAPHY;  Surgeon: Mady Bruckner, MD;  Location: [facility] INVASIVE CV LAB;  Service: Cardiovascular;  Laterality: N/A;   LEFT HEART CATHETERIZATION WITH CORONARY ANGIOGRAM N/A 05/11/2011   Procedure: LEFT HEART CATHETERIZATION WITH CORONARY ANGIOGRAM;  Surgeon: Debby DELENA Sor, MD;  Location: [facility] CATH LAB;  Service: Cardiovascular;  Laterality: N/A;  Possible percutaneous coronary intervention, possible IVUS   LEFT HEART CATHETERIZATION WITH CORONARY ANGIOGRAM N/A 12/15/2011   Procedure: LEFT HEART CATHETERIZATION WITH CORONARY ANGIOGRAM;  Surgeon: Dorn JINNY Lesches, MD;  Location: [facility] CATH LAB;  Service: Cardiovascular;  Laterality: N/A;   LEFT HEART CATHETERIZATION WITH CORONARY ANGIOGRAM N/A 09/05/2014   Procedure: LEFT HEART CATHETERIZATION WITH CORONARY ANGIOGRAM;  Surgeon: Bruckner JONETTA Cash, MD;  Location: [facility] CATH LAB;  Service: Cardiovascular;  Laterality: N/A;   LIPOMA EXCISION     back of the head   NM MYOCAR PERF WALL MOTION  02/2012   lexiscan myoview; mild perfusion defect in mid inferolateral & basal inferolateral region (infarct/scar); EF 52%, abnormal but ow risk scan   PERCUTANEOUS CORONARY STENT INTERVENTION (PCI-S)  09/05/2014   Procedure: PERCUTANEOUS CORONARY STENT INTERVENTION (PCI-S);  Surgeon: Bruckner JONETTA Cash, MD;  Location: [facility] CATH LAB;  Service: Cardiovascular;;   TONSILLECTOMY     Patient Active Problem List   Diagnosis Date Noted   Impingement syndrome of right shoulder 07/01/2023   Pain in  right shoulder 06/16/2023   Anxiety disorder, unspecified 02/16/2023   Alcohol  use disorder 02/16/2023   Sleep disorder, unspecified 02/16/2023   Alcohol  abuse 02/15/2023   Alcohol  dependence ([name]) 05/16/2022   Low energy 03/06/2022   Moderate alcohol  dependence in early remission ([name]) 02/24/2022   Suicidal ideation 07/07/2021   Generalized anxiety disorder 07/07/2021   Other allergic rhinitis 09/09/2020   Adverse reaction to food, subsequent encounter 09/09/2020   History of bee sting allergy 09/09/2020   Alcohol  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) 03/19/2020   MDD (major depressive disorder), recurrent episode, severe ([name]) 03/18/2020   History of substance abuse ([name]) 01/04/2020   Severe 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 features ([name]) 12/10/2019   Tinea cruris 07/06/2019   Hemoglobin A1c less than 7.0% 07/06/2019   Muscle strain of wrist 04/05/2019   Pain in both wrists 04/05/2019   Anxiety 04/05/2019   Prediabetes 04/05/2019   MDD (major depressive disorder)  01/12/2019   Chest pain 10/25/2018   MDD (major depressive disorder), recurrent severe, without psychosis ([name]) 08/15/2018   Influenza A 07/17/2017   Lactic acidosis    Cough    Nausea and vomiting    Severe episode of recurrent major depressive disorder, without psychotic features ([name])    MDD (major depressive disorder), single episode, severe with psychotic features ([name]) 06/27/2017   MDD (major depressive disorder), severe ([name]) 06/23/2017   Essential hypertension 01/13/2016   Unstable angina ([name])    ED (erectile dysfunction) 02/15/2014   Atypical chest pain 12/14/2011   Allergy to contrast media (used for diagnostic x-rays) 05/12/2011   Dyslipidemia, goal LDL below 70 05/09/2011   DM2 (diabetes mellitus, type 2) ([name]) 07/10/2010   CAD S/P percutaneous coronary angioplasty 07/10/2010    PCP: Oley President NP   REFERRING PROVIDER: Persons, Mary PA   REFERRING DIAG: Rt shoulder  pain , impingement   THERAPY DIAG:  Acute pain of right shoulder  Rationale for Evaluation and Treatment: Rehabilitation  ONSET DATE: 2-3 mos   SUBJECTIVE:                                                                                                                                                                                      SUBJECTIVE STATEMENT:   Pt was out sick for some time with the flu.  He reports his shoulder feels a but stronger. He racks his bike a little easier.   Eval: Patient presents with right-sided shoulder pain of less than 3 months duration.  He reports pain initially started as he was seated and working on his computer reaching across his body multiple times.  The pain wakes him up at night.  He reports it is hard to lift and rack his bike, complete ADLs bathing dressing and hygiene without increased shoulder pain.  He has not had physical therapy for the shoulder before and has never had surgery.  The right arm feels weak functionally but he does not drop items or have any weakness in his grip.  His pain does not radiate.  Recently had an injection posterior right shoulder but he reports no improvement or change. Hand dominance: Right  PERTINENT HISTORY: MI, multiple cardiac catheterizations  PAIN:  Are you having pain? Yes: NPRS scale: none at rest, can still be severe with lifting  Pain location: Rt  lateral shoulder  Pain description: sharp, achy  Aggravating factors: using it  Relieving factors: rest, ice pack/hot shower   PRECAUTIONS: None  RED FLAGS: None   WEIGHT BEARING RESTRICTIONS: No  FALLS:  Has patient fallen in last 6 months? 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  DIAGNOSTIC FINDINGS:  None  PATIENT SURVEYS:  FOTO 53%  COGNITION: Overall cognitive status: Within functional limits for tasks assessed     SENSATION: WFL  POSTURE: Good sitting posture without cues   UPPER EXTREMITY MMT   MMT Right eval Left eval  Shoulder flexion 4+ pain  5  Shoulder extension    Shoulder abduction 4+ pain  5  Shoulder adduction    Shoulder internal rotation 5    Shoulder external rotation 5 pain    Elbow flexion    Elbow extension    Wrist flexion    Wrist extension    Wrist ulnar deviation    Wrist radial deviation    Wrist pronation    Wrist supination    (Blank rows = not tested)  UPPER EXTREMITY ROM   AROM  Right eval Left eval  Shoulder flexion 165   Shoulder extension 160   Shoulder abduction    Shoulder adduction    Shoulder internal rotation Functional reach toT 7   Shoulder external rotation Functional reach to T3, full in supine with arm at 90 deg abd   Grip strength (lbs) 83 pounds 79 pounds  (Blank rows = not tested)  SHOULDER SPECIAL TESTS: Impingement tests: Neer impingement test: positive  Pain with cross body adduction  JOINT MOBILITY TESTING:  Generalized joint stiffness in all directions of right glenohumeral motion   PALPATION:  Min TTP to the right anterolateral aspect of shoulder, no crepitus                                                                                                                              TREATMENT DATE:    [facility] Adult PT Treatment:                                                DATE: 08/05/23 Therapeutic Activity: UBE for warm up L1 6 min bilateral horizontal abduction (GTB)  x 15  external rotation Green Theraband (GTB) x 15  R scaption in side-lying GTB x 15  Rt flexion GTB x 15  Corner stretch x 30 sec x 3 various  positions Wall semi circles green band 1 min alternating  Row black band x 15 Extension black band  x 10  Narrow loop forward flexion  x 10 x 2 at the wall Dori)  Open book x 5 each side   Self Care: Ways  to rack his bike without crossing Rt arm over the midline - brief during UBE    07/15/2023 Patient performed program prescribed: Green Thera-Band bilateral horizontal abduction, external rotation and right-sided scaption and side-lying  PATIENT EDUCATION: Education details: Plan of care, impingement versus tendinitis, posture rotator cuff and scapula model, home program Person educated: Patient Education method: Explanation, Demonstration, Verbal cues, and Handouts Education comprehension: verbalized understanding and needs further education  HOME EXERCISE PROGRAM: Access Code: [email or web address] URL: [email or web address] Date: 07/15/2023 Prepared by: Delon Norma  Exercises - Shoulder External Rotation and Scapular Retraction with Resistance  - 1-2 x daily - 7 x weekly - 2 sets - 10 reps - 5 hold - Sidelying Shoulder Abduction with Resistance to 60 Degrees  - 1-2 x daily - 7 x weekly - 2 sets - 10 reps - 5 hold - Standing Shoulder Horizontal Abduction with Resistance  - 1-2 x daily - 7 x weekly - 2 sets - 10 reps - 5 hold Open book and corner   ASSESSMENT:  CLINICAL IMPRESSION: Patient racks his bike with Rt UE crossing over his body, knows to try to modify by keeping his arms close to his body and use trunk a bit more.  He did well with the session, adding in some mobility for improved posture and shoulder ROM. Patient appreciative and will cont to benefit from skilled PT to restore pain free motion and strength.    OBJECTIVE IMPAIRMENTS: decreased mobility, decreased ROM, decreased strength, increased fascial restrictions, impaired flexibility, impaired UE functional use, and pain.   ACTIVITY LIMITATIONS: carrying, lifting, sleeping, reach over head, and  hygiene/grooming  PARTICIPATION LIMITATIONS: interpersonal relationship and community activity  PERSONAL FACTORS: Past/current experiences and 1 comorbidity: Cardiac issues  are also affecting patient's functional outcome.   REHAB POTENTIAL: Excellent  CLINICAL DECISION MAKING: Unstable/unpredictable  EVALUATION COMPLEXITY: Low   GOALS: Goals reviewed with patient? Yes  LONG TERM GOALS: Target date: 08/26/2023    Patient will be independent with home exercise program for right upper extremity Baseline:  Goal status: INITIAL  2.  Patient will increase right shoulder strength to 5 out of 5 in all planes of motion without increased pain Baseline:  Goal status: INITIAL  3.  Patient will be able to rack his bike and city bus without increased pain 75% of the time Baseline:  Goal status: INITIAL  4.  FOTO score will improve to 59% in order to demonstrate improved functional mobility Baseline: 53% Goal status: INITIAL   PLAN:  PT FREQUENCY: 1x/week  PT DURATION: 6 weeks  PLANNED INTERVENTIONS: 97164- PT Re-evaluation, 97110-Therapeutic exercises, 97530- Therapeutic activity, 97112- Neuromuscular re-education, 97535- Self Care, [id number]- Manual therapy, Taping, Dry Needling, Joint mobilization, Cryotherapy, and Moist heat  PLAN FOR NEXT SESSION: posture, general spine and shoulder mobility, review lifting technique for racking his bike     Janney Priego, PT 08/05/2023, 8:44 AM   Delon Norma, PT 08/05/23 8:44 AM Phone: [phone] Fax: [phone]

## 2023-08-12 ENCOUNTER — Ambulatory Visit: Payer: MEDICAID | Admitting: Physical Therapy

## 2023-08-12 ENCOUNTER — Encounter: Payer: Self-pay | Admitting: Physical Therapy

## 2023-08-12 DIAGNOSIS — M25511 Pain in right shoulder: Secondary | ICD-10-CM

## 2023-08-12 NOTE — Therapy (Signed)
OUTPATIENT PHYSICAL THERAPY SHOULDER EVALUATION   Patient Name: [name] MRN: [id number] DOB:1961/05/16, 63 y.o., male Today's Date: 08/12/2023  END OF SESSION:  PT End of Session - 08/12/23 0805     Visit Number 3    Number of Visits 7    Date for PT Re-Evaluation 08/26/23    Authorization Type [employer]    Authorization Time Period 1/20-3/8 , 6 visits    PT Start Time 0801    PT Stop Time 0855    PT Time Calculation (min) 54 min    Activity Tolerance Patient tolerated treatment well    Behavior During Therapy [facility] for tasks assessed/performed              Past Medical History:  Diagnosis Date   Alcohol dependence ([name])    Allergies    Arthritis    Chest pain    Chronic lower back pain    Chronic pain of right wrist    Cocaine abuse ([name])    per note on 01/18/23   Coronary artery disease    a. Multiple prior caths/PCI. Cath 2013 with possible spasm of RCA, 70% ISR of mid LCx with subsequent DES to mLCx and prox LCX. b. H/o microvascular angina. c. Recurrent angina 08/2014 - s/p PTCA/DES to prox Cx, PTCA/CBA to OM1.  c. LHC 06/10/15 with patent stents and some ISR in LCX and OM-1 that was not flow limiting --> Rx    Dyslipidemia    a. Intolerant to many statins except tolerating Livalo.   GERD (gastroesophageal reflux disease)    H/O cardiac catheterization 10/25/2018   Heart attack [facility])    Hypertension    Myocardial infarction [facility]) ~ 2010   Renal mass    S/P angioplasty with stent, DES, to proximal and mid LCX 12/15/11 12/15/2011   S/P foot surgery, right 04/2021   Shoulder pain    Stroke ([name])    pt. reports had a stroke around time of MI 2010   Type II diabetes mellitus ([name])    Unstable angina [facility])    Past Surgical History:  Procedure Laterality Date   CARDIAC CATHETERIZATION  06/15/2002   LAD with prox 40% stenosis, norma L main, Cfx with 25% lesion, RCA with long mid 25% stenosis (Dr. Daiva Nakayama)   CARDIAC CATHETERIZATION  04/01/2010    normal L main, LAD wit mild stenosis, L Cfx with 70% in-stent restenosis, RCA with 70% in-stent restenosis, LVEF >60% (Dr. K. Italy Hilty) - cutting ballon arthrectomy to RCA & Cfx (Dr. Langston Reusing)   CARDIAC CATHETERIZATION  08/25/2010   preserved global LV contractility; multivessel CAD, diffuse 90-95% in-stent restenosis in prox placed Cfx stent - cutting balloon arthrectomy in Cfx with multiple dilatations 90-95% to 0% stenosis (Dr. Bishop Limbo)   CARDIAC CATHETERIZATION  01/26/2011   PCI & stenting of aggresive in-stent restenosis within previously stented AV groove Cfx with 3.0x48mm Taxus DES (previous stents were Promus) (Dr. Erlene Quan)   CARDIAC CATHETERIZATION  05/11/2011   preserved LV function, 40% mid LAD stenosis, 30-40% narrowing proximal to stented semgnet of prox Cfx, patent mid RCA stent with smooth 20% narrowing in distal RCA (Dr. Bishop Limbo)   CARDIAC CATHETERIZATION  12/15/2011   PCI & stenting of proximal & mid Cfx with DES - 3.0x66mm in proximal, 3.0x35mm in mid (Dr. Erlene Quan)   CARDIAC CATHETERIZATION N/A 06/10/2015   Procedure: Left Heart Cath and Coronary Angiography;  Surgeon: Dolores Patty, MD;  Location: [facility] INVASIVE  CV LAB;  Service: Cardiovascular;  Laterality: N/A;   CARDIAC CATHETERIZATION  10/25/2018   cardiometabolic testing  08/08/2012   good exercise effort, peak VO2 79% predicted with normal VO2 HR curves (mild deconditioning)   COLONOSCOPY  12/2012   diminutive hyperplastic sigmoid poyp so repeat routine 2024   CORONARY BALLOON ANGIOPLASTY N/A 10/25/2018   Procedure: CORONARY BALLOON ANGIOPLASTY;  Surgeon: Corky Crafts, MD;  Location: [facility] INVASIVE CV LAB;  Service: Cardiovascular;  Laterality: N/A;   CORONARY BALLOON ANGIOPLASTY N/A 09/29/2019   Procedure: CORONARY BALLOON ANGIOPLASTY;  Surgeon: Yvonne Kendall, MD;  Location: [facility] INVASIVE CV LAB;  Service: Cardiovascular;  Laterality: N/A;   CORONARY ULTRASOUND/IVUS N/A 09/29/2019   Procedure: Intravascular  Ultrasound/IVUS;  Surgeon: Yvonne Kendall, MD;  Location: [facility] INVASIVE CV LAB;  Service: Cardiovascular;  Laterality: N/A;   EXCISIONAL HEMORRHOIDECTOMY  1984   LEFT HEART CATH AND CORONARY ANGIOGRAPHY N/A 10/25/2018   Procedure: LEFT HEART CATH AND CORONARY ANGIOGRAPHY;  Surgeon: Corky Crafts, MD;  Location: [facility] INVASIVE CV LAB;  Service: Cardiovascular;  Laterality: N/A;   LEFT HEART CATH AND CORONARY ANGIOGRAPHY N/A 09/29/2019   Procedure: LEFT HEART CATH AND CORONARY ANGIOGRAPHY;  Surgeon: Yvonne Kendall, MD;  Location: [facility] INVASIVE CV LAB;  Service: Cardiovascular;  Laterality: N/A;   LEFT HEART CATHETERIZATION WITH CORONARY ANGIOGRAM N/A 05/11/2011   Procedure: LEFT HEART CATHETERIZATION WITH CORONARY ANGIOGRAM;  Surgeon: Lennette Bihari, MD;  Location: [facility] CATH LAB;  Service: Cardiovascular;  Laterality: N/A;  Possible percutaneous coronary intervention, possible IVUS   LEFT HEART CATHETERIZATION WITH CORONARY ANGIOGRAM N/A 12/15/2011   Procedure: LEFT HEART CATHETERIZATION WITH CORONARY ANGIOGRAM;  Surgeon: Runell Gess, MD;  Location: [facility] CATH LAB;  Service: Cardiovascular;  Laterality: N/A;   LEFT HEART CATHETERIZATION WITH CORONARY ANGIOGRAM N/A 09/05/2014   Procedure: LEFT HEART CATHETERIZATION WITH CORONARY ANGIOGRAM;  Surgeon: Kathleene Hazel, MD;  Location: [facility] CATH LAB;  Service: Cardiovascular;  Laterality: N/A;   LIPOMA EXCISION     back of the head   NM MYOCAR PERF WALL MOTION  02/2012   lexiscan myoview; mild perfusion defect in mid inferolateral & basal inferolateral region (infarct/scar); EF 52%, abnormal but ow risk scan   PERCUTANEOUS CORONARY STENT INTERVENTION (PCI-S)  09/05/2014   Procedure: PERCUTANEOUS CORONARY STENT INTERVENTION (PCI-S);  Surgeon: Kathleene Hazel, MD;  Location: [facility] CATH LAB;  Service: Cardiovascular;;   TONSILLECTOMY     Patient Active Problem List   Diagnosis Date Noted   Impingement syndrome of right shoulder 07/01/2023   Pain in  right shoulder 06/16/2023   Anxiety disorder, unspecified 02/16/2023   Alcohol use disorder 02/16/2023   Sleep disorder, unspecified 02/16/2023   Alcohol abuse 02/15/2023   Alcohol dependence ([name]) 05/16/2022   Low energy 03/06/2022   Moderate alcohol dependence in early remission ([name]) 02/24/2022   Suicidal ideation 07/07/2021   Generalized anxiety disorder 07/07/2021   Other allergic rhinitis 09/09/2020   Adverse reaction to food, subsequent encounter 09/09/2020   History of bee sting allergy 09/09/2020   Alcohol dependence with alcohol-induced mood disorder ([name]) 03/19/2020   MDD (major depressive disorder), recurrent episode, severe ([name]) 03/18/2020   History of substance abuse ([name]) 01/04/2020   Severe 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 features ([name]) 12/10/2019   Tinea cruris 07/06/2019   Hemoglobin A1c less than 7.0% 07/06/2019   Muscle strain of wrist 04/05/2019   Pain in both wrists 04/05/2019   Anxiety 04/05/2019   Prediabetes 04/05/2019   MDD (major depressive disorder)  01/12/2019   Chest pain 10/25/2018   MDD (major depressive disorder), recurrent severe, without psychosis ([name]) 08/15/2018   Influenza A 07/17/2017   Lactic acidosis    Cough    Nausea and vomiting    Severe episode of recurrent major depressive disorder, without psychotic features ([name])    MDD (major depressive disorder), single episode, severe with psychotic features ([name]) 06/27/2017   MDD (major depressive disorder), severe ([name]) 06/23/2017   Essential hypertension 01/13/2016   Unstable angina ([name])    ED (erectile dysfunction) 02/15/2014   Atypical chest pain 12/14/2011   Allergy to contrast media (used for diagnostic x-rays) 05/12/2011   Dyslipidemia, goal LDL below 70 05/09/2011   DM2 (diabetes mellitus, type 2) ([name]) 07/10/2010   CAD S/P percutaneous coronary angioplasty 07/10/2010    PCP: Angus Seller NP   REFERRING PROVIDER: Persons, Mary PA   REFERRING DIAG: Rt shoulder  pain , impingement   THERAPY DIAG:  Acute pain of right shoulder  Rationale for Evaluation and Treatment: Rehabilitation  ONSET DATE: 2-3 mos   SUBJECTIVE:                                                                                                                                                                                      SUBJECTIVE STATEMENT:   Pt is noticing much less pain , we are on the right track.  Doing exercises every other day.   Eval: Patient presents with right-sided shoulder pain of less than 3 months duration.  He reports pain initially started as he was seated and working on his computer reaching across his body multiple times.  The pain wakes him up at night.  He reports it is hard to lift and rack his bike, complete ADLs bathing dressing and hygiene without increased shoulder pain.  He has not had physical therapy for the shoulder before and has never had surgery.  The right arm feels weak functionally but he does not drop items or have any weakness in his grip.  His pain does not radiate.  Recently had an injection posterior right shoulder but he reports no improvement or change. Hand dominance: Right  PERTINENT HISTORY: MI, multiple cardiac catheterizations  PAIN:  Are you having pain? Yes: NPRS scale: none at rest, can still be severe with lifting  Pain location: Rt  lateral shoulder  Pain description: sharp, achy  Aggravating factors: using it  Relieving factors: rest, ice pack/hot shower   PRECAUTIONS: None  RED FLAGS: None   WEIGHT BEARING RESTRICTIONS: No  FALLS:  Has patient fallen in last 6 months? 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  DIAGNOSTIC FINDINGS:  None  PATIENT SURVEYS:  FOTO 53%  COGNITION: Overall cognitive status: Within functional limits for tasks assessed     SENSATION: WFL  POSTURE: Good sitting posture without cues   UPPER EXTREMITY MMT   MMT Right eval Left eval  Shoulder flexion 4+ pain  5  Shoulder extension    Shoulder abduction 4+ pain  5  Shoulder adduction    Shoulder internal rotation 5    Shoulder external rotation 5 pain    Elbow flexion    Elbow extension    Wrist flexion    Wrist extension    Wrist ulnar deviation    Wrist radial deviation    Wrist pronation    Wrist supination    (Blank rows = not tested)  UPPER EXTREMITY ROM   AROM  Right eval Left eval  Shoulder flexion 165   Shoulder extension 160   Shoulder abduction    Shoulder adduction    Shoulder internal rotation Functional reach toT 7   Shoulder external rotation Functional reach to T3, full in supine with arm at 90 deg abd   Grip strength (lbs) 83 pounds 79 pounds  (Blank rows = not tested)  SHOULDER SPECIAL TESTS: Impingement tests: Neer impingement test: positive  Pain with cross body adduction  JOINT MOBILITY TESTING:  Generalized joint stiffness in all directions of right glenohumeral motion   PALPATION:  Min TTP to the right anterolateral aspect of shoulder, no crepitus                                                                                                                              TREATMENT DATE:   [facility] Adult PT Treatment:                                                DATE: 08/12/23 Therapeutic Activity: UBE level 1 for 5 min in reverse Row and extension x 15 Black band  Horizontal abduction green band x 10  Goal post ER green band x 10 each UE  Wall scapular protraction/retraction x green band and wall push up Modified push up green band on upper arm on  high mat table  Sidelying 3-5 lbs DB for ER, scaption and flexion Rt UE x 8-12  Thoracic rotation  x 5 , 10 sec in quadruped Quadruped 5 lbs Rt UE extension and triceps  Standing functional core/lifting with dowel and free motion  1 plate  diagonal lift: pain on Rt side, able to do x 5 with dowel 1 plate for double arm diagonal lift/chop  Modalities: MHP per pt request 10 min Rt UE supine     [facility] Adult PT Treatment:                                                DATE: 08/05/23 Therapeutic Activity: UBE for warm up L1 6 min bilateral horizontal abduction (GTB)  x 15  external rotation Green Theraband (GTB) x 15  R scaption in side-lying GTB x 15  Rt flexion GTB x 15  Corner stretch x 30 sec x 3 various positions Wall semi circles green band 1 min alternating  Row black band x 15 Extension black band  x 10  Narrow loop forward flexion  x 10 x 2 at the wall [place] Si)  Open book x 5 each side   Self Care: Ways to rack his bike without crossing Rt arm over the midline - brief during UBE    07/15/2023 Patient performed program prescribed: Green Thera-Band bilateral horizontal abduction, external rotation and right-sided scaption and side-lying  PATIENT EDUCATION: Education details: Plan of care, impingement versus tendinitis, posture rotator cuff and scapula model, home program Person educated: Patient Education method: Explanation, Demonstration, Verbal cues, and Handouts Education comprehension: verbalized understanding and needs further education  HOME EXERCISE PROGRAM: Access Code: [email or web address] URL: [email or web address] Date: 07/15/2023 Prepared by: Karie Mainland  Exercises - Shoulder External Rotation and Scapular Retraction with Resistance  - 1-2 x daily - 7 x weekly - 2 sets - 10 reps - 5 hold - Sidelying Shoulder Abduction with Resistance to 60 Degrees  - 1-2 x daily - 7 x weekly - 2 sets - 10 reps - 5 hold - Standing Shoulder Horizontal Abduction with Resistance   - 1-2 x daily - 7 x weekly - 2 sets - 10 reps - 5 hold Open book and corner   ASSESSMENT:  CLINICAL IMPRESSION: Pt able to report improvements overall with functional activity.  His pain is min overall.  Pt working on muscular endurance, postural alignment and balancing upper back strength with anterior shoulder strength.  Pain when simulation of Rt UE lifting /raking bike performed.  He will cont to benefit from skilled PT to restore pain free motion and strength.    OBJECTIVE IMPAIRMENTS: decreased mobility, decreased ROM, decreased strength, increased fascial restrictions, impaired flexibility, impaired UE functional use, and pain.   ACTIVITY LIMITATIONS: carrying, lifting, sleeping, reach over head, and hygiene/grooming  PARTICIPATION LIMITATIONS: interpersonal relationship and community activity  PERSONAL FACTORS: Past/current experiences and 1 comorbidity: Cardiac issues  are also affecting patient's functional outcome.   REHAB POTENTIAL: Excellent  CLINICAL DECISION MAKING: Unstable/unpredictable  EVALUATION COMPLEXITY: Low   GOALS: Goals reviewed with patient? Yes  LONG TERM GOALS: Target date: 08/26/2023    Patient will be independent with home exercise program for right upper extremity Baseline:  Goal status: ongoing   2.  Patient will increase right shoulder strength to 5 out of 5 in all planes of motion without increased pain Baseline:  Goal status: ongoing   3.  Patient will be able to rack his bike and city bus without increased pain 75% of the time Baseline:  Goal status: ongoing  4.  FOTO score will improve to 59% in order to demonstrate improved functional mobility  Baseline: 53% Goal status: ongoing    PLAN:  PT FREQUENCY: 1x/week  PT DURATION: 6 weeks  PLANNED INTERVENTIONS: 97164- PT Re-evaluation, 97110-Therapeutic exercises, 97530- Therapeutic activity, 97112- Neuromuscular re-education, 97535- Self Care, 78469- Manual therapy, Taping, Dry  Needling, Joint mobilization, Cryotherapy, and Moist heat  PLAN FOR NEXT SESSION: posture, general spine and shoulder mobility, review lifting technique for racking his bike     Ocie Stanzione, PT 08/12/2023, 11:31 AM   Karie Mainland, PT 08/12/23 11:31 AM Phone: [phone] Fax: [phone]

## 2023-08-13 ENCOUNTER — Encounter (HOSPITAL_COMMUNITY): Payer: Self-pay

## 2023-08-13 ENCOUNTER — Emergency Department (HOSPITAL_COMMUNITY)
Admission: EM | Admit: 2023-08-13 | Discharge: 2023-08-13 | Disposition: A | Discharge status: Home / Self Care | Payer: MEDICAID | Attending: Emergency Medicine | Admitting: Emergency Medicine

## 2023-08-13 ENCOUNTER — Other Ambulatory Visit: Payer: Self-pay

## 2023-08-13 ENCOUNTER — Emergency Department (HOSPITAL_COMMUNITY): Payer: MEDICAID

## 2023-08-13 DIAGNOSIS — E119 Type 2 diabetes mellitus without complications: Secondary | ICD-10-CM | POA: Insufficient documentation

## 2023-08-13 DIAGNOSIS — I2510 Atherosclerotic heart disease of native coronary artery without angina pectoris: Secondary | ICD-10-CM | POA: Insufficient documentation

## 2023-08-13 DIAGNOSIS — Z8673 Personal history of transient ischemic attack (TIA), and cerebral infarction without residual deficits: Secondary | ICD-10-CM | POA: Diagnosis not present

## 2023-08-13 DIAGNOSIS — Z7982 Long term (current) use of aspirin: Secondary | ICD-10-CM | POA: Diagnosis not present

## 2023-08-13 DIAGNOSIS — I10 Essential (primary) hypertension: Secondary | ICD-10-CM | POA: Diagnosis not present

## 2023-08-13 DIAGNOSIS — Z79899 Other long term (current) drug therapy: Secondary | ICD-10-CM | POA: Insufficient documentation

## 2023-08-13 DIAGNOSIS — Z87891 Personal history of nicotine dependence: Secondary | ICD-10-CM | POA: Diagnosis not present

## 2023-08-13 DIAGNOSIS — N50811 Right testicular pain: Secondary | ICD-10-CM | POA: Diagnosis present

## 2023-08-13 LAB — COMPREHENSIVE METABOLIC PANEL
ALT: 32 U/L (ref 0–44)
AST: 23 U/L (ref 15–41)
Albumin: 4.3 g/dL (ref 3.5–5.0)
Alkaline Phosphatase: 46 U/L (ref 38–126)
Anion gap: 8 (ref 5–15)
BUN: 13 mg/dL (ref 8–23)
CO2: 22 mmol/L (ref 22–32)
Calcium: 9.3 mg/dL (ref 8.9–10.3)
Chloride: 107 mmol/L (ref 98–111)
Creatinine, Ser: 1.02 mg/dL (ref 0.61–1.24)
GFR, Estimated: >60 mL/min (ref >60)
Glucose, Bld: 137 mg/dL — ABNORMAL HIGH (ref 70–99)
Potassium: 4.5 mmol/L (ref 3.5–5.1)
Sodium: 137 mmol/L (ref 135–145)
Total Bilirubin: 0.6 mg/dL (ref 0.0–1.2)
Total Protein: 7 g/dL (ref 6.5–8.1)

## 2023-08-13 LAB — URINALYSIS, ROUTINE W REFLEX MICROSCOPIC
Bacteria, UA: NONE SEEN
Bilirubin Urine: NEGATIVE
Glucose, UA: NEGATIVE mg/dL
Hgb urine dipstick: NEGATIVE
Ketones, ur: NEGATIVE mg/dL
Leukocytes,Ua: NEGATIVE
Nitrite: NEGATIVE
Protein, ur: 30 mg/dL — AB
Specific Gravity, Urine: 1.027 (ref 1.005–1.030)
pH: 5 (ref 5.0–8.0)

## 2023-08-13 LAB — CBC
HCT: 45 % (ref 39.0–52.0)
Hemoglobin: 14.8 g/dL (ref 13.0–17.0)
MCH: 27.7 pg (ref 26.0–34.0)
MCHC: 32.9 g/dL (ref 30.0–36.0)
MCV: 84.1 fL (ref 80.0–100.0)
Platelets: 219 10*3/uL (ref 150–400)
RBC: 5.35 MIL/uL (ref 4.22–5.81)
RDW: 12.8 % (ref 11.5–15.5)
WBC: 5.8 10*3/uL (ref 4.0–10.5)
nRBC: 0 % (ref 0.0–0.2)

## 2023-08-13 MED ORDER — LEVOFLOXACIN 500 MG PO TABS: 500.0000 mg | ORAL_TABLET | Freq: Every day | ORAL | 0 refills | Status: DC

## 2023-08-13 NOTE — Discharge Instructions (Addendum)
We evaluated you for your testicular pain.  Your ultrasound did not show anything dangerous, but given your symptoms are the same as your prior episodes of testicle infection, we have started you on antibiotics.  Please take these as prescribed.  Please follow-up closely with your urologist.  Please return to the emergency department if you have any worsening pain, severe pain, lightheadedness or dizziness, fevers or chills, vomiting, or any other concerning symptoms.

## 2023-08-13 NOTE — ED Provider Triage Note (Signed)
Emergency Medicine Provider Triage Evaluation Note  [name] , a 63 y.o. male  was evaluated in triage.  Pt complains of right testicle pain for the past month. Reports increase in urinary frequency. Denies dysuria. Denies any swelling. Reports he has had this before with "cysts" on his testicle where he was given a antibiotic and it went away. Reports he has urology follow up in June. Deneis swelling, fever, abdominal pain, or dysuria/hematuria.  Review of Systems  Positive:  Negative:   Physical Exam  BP (!) 139/90   Pulse 65   Temp (!) 97.4 F (36.3 C)   Resp 18   Ht 5\' 8"  (1.727 m)   Wt 88.5 kg   SpO2 99%   BMI 29.65 kg/m  Gen:   Awake, no distress   Resp:  Normal effort  MSK:   Moves extremities without difficulty  Other:    Medical Decision Making  Medically screening exam initiated at 8:05 AM.  Appropriate orders placed.  PIERSON VANTOL was informed that the remainder of the evaluation will be completed by another provider, this initial triage assessment does not replace that evaluation, and the importance of remaining in the ED until their evaluation is complete.  Labs and [place] ordered.    Achille Rich, [place] 08/13/23 [phone]

## 2023-08-13 NOTE — ED Triage Notes (Signed)
Pt c/o right sided groin and testicular pain x 1 month. Pt states he has previously had in the past and received antibiotics that helped with the pain. Pt c/o urinary frequency as well.

## 2023-08-13 NOTE — ED Notes (Signed)
Pt to room w/ steady gait. Pt getting into gown at this time.

## 2023-08-13 NOTE — ED Notes (Signed)
Pt placed onto monitor. Warm blanket given. Call light in reach.

## 2023-08-15 NOTE — ED Provider Notes (Signed)
[place] EMERGENCY DEPARTMENT AT [facility] Provider Note  CSN: [id number] Arrival date & time: 08/13/23 3295  Chief Complaint(s) Groin Pain  HPI [name] is a 63 y.o. male with PMH DM, CAD p/w right testicle pain. No dysuria. Reports tender to touch. No fevers, chills, chest pain, shortness of breath, urethral discharge. Present for a month, worsening. Feels same as prior episode of epididymitis    Past Medical History Past Medical History:  Diagnosis Date   Alcohol dependence ([name])    Allergies    Arthritis    Chest pain    Chronic lower back pain    Chronic pain of right wrist    Cocaine abuse ([name])    per note on 01/18/23   Coronary artery disease    a. Multiple prior caths/PCI. Cath 2013 with possible spasm of RCA, 70% ISR of mid LCx with subsequent DES to mLCx and prox LCX. b. H/o microvascular angina. c. Recurrent angina 08/2014 - s/p PTCA/DES to prox Cx, PTCA/CBA to OM1.  c. LHC 06/10/15 with patent stents and some ISR in LCX and OM-1 that was not flow limiting --> Rx    Dyslipidemia    a. Intolerant to many statins except tolerating Livalo.   GERD (gastroesophageal reflux disease)    H/O cardiac catheterization 10/25/2018   Heart attack [facility])    Hypertension    Myocardial infarction [facility]) ~ 2010   Renal mass    S/P angioplasty with stent, DES, to proximal and mid LCX 12/15/11 12/15/2011   S/P foot surgery, right 04/2021   Shoulder pain    Stroke [facility])    pt. reports had a stroke around time of MI 2010   Type II diabetes mellitus ([name])    Unstable angina [facility])    Patient Active Problem List   Diagnosis Date Noted   Impingement syndrome of right shoulder 07/01/2023   Pain in right shoulder 06/16/2023   Anxiety disorder, unspecified 02/16/2023   Alcohol use disorder 02/16/2023   Sleep disorder, unspecified 02/16/2023   Alcohol abuse 02/15/2023   Alcohol dependence ([name]) 05/16/2022   Low energy 03/06/2022   Moderate alcohol dependence in early  remission ([name]) 02/24/2022   Suicidal ideation 07/07/2021   Generalized anxiety disorder 07/07/2021   Other allergic rhinitis 09/09/2020   Adverse reaction to food, subsequent encounter 09/09/2020   History of bee sting allergy 09/09/2020   Alcohol dependence with alcohol-induced mood disorder ([name]) 03/19/2020   MDD (major depressive disorder), recurrent episode, severe ([name]) 03/18/2020   History of substance abuse ([name]) 01/04/2020   Severe 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 features ([name]) 12/10/2019   Tinea cruris 07/06/2019   Hemoglobin A1c less than 7.0% 07/06/2019   Muscle strain of wrist 04/05/2019   Pain in both wrists 04/05/2019   Anxiety 04/05/2019   Prediabetes 04/05/2019   MDD (major depressive disorder) 01/12/2019   Chest pain 10/25/2018   MDD (major depressive disorder), recurrent severe, without psychosis ([name]) 08/15/2018   Influenza A 07/17/2017   Lactic acidosis    Cough    Nausea and vomiting    Severe episode of recurrent major depressive disorder, without psychotic features ([name])    MDD (major depressive disorder), single episode, severe with psychotic features ([name]) 06/27/2017   MDD (major depressive disorder), severe ([name]) 06/23/2017   Essential hypertension 01/13/2016   Unstable angina ([name])    ED (erectile dysfunction) 02/15/2014   Atypical chest pain 12/14/2011   Allergy to contrast media (used for diagnostic x-rays) 05/12/2011  Dyslipidemia, goal LDL below 70 05/09/2011   DM2 (diabetes mellitus, type 2) ([name]) 07/10/2010   CAD S/P percutaneous coronary angioplasty 07/10/2010   Home Medication(s) Prior to Admission medications   Medication Sig Start Date End Date Taking? Authorizing Provider  levofloxacin (LEVAQUIN) 500 MG tablet Take 1 tablet (500 mg total) by mouth daily. 08/13/23  Yes Lonell Grandchild, MD  ACCU-CHEK GUIDE TEST test strip USE 1 STRIP TO CHECK GLUCOSE THREE TIMES DAILY AS DIRECTED 07/01/23   Ivonne Andrew, NP  amLODipine  (NORVASC) 10 MG tablet Take 1 tablet (10 mg total) by mouth daily. 05/20/23   Ivonne Andrew, NP  amoxicillin-clavulanate (AUGMENTIN) 875-125 MG tablet Take 1 tablet by mouth every 12 (twelve) hours. 07/16/23   Wynonia Lawman A, NP  aspirin EC 81 MG tablet Take by mouth.    [provider]  Cholecalciferol 25 MCG (1000 UT) tablet Take 1,000 Units by mouth daily.    [provider]  clopidogrel (PLAVIX) 75 MG tablet Take 1 tablet by mouth once daily 08/03/23   Ivonne Andrew, NP  cyclobenzaprine (FLEXERIL) 10 MG tablet Take 1 tablet by mouth 3 times daily as needed for muscle spasm. Warning: May cause drowsiness. 06/03/23   Mardella Layman, MD  EPINEPHrine 0.3 mg/0.3 mL IJ SOAJ injection Inject 0.3 mg into the muscle as needed for anaphylaxis. 05/07/21   Verlee Monte, MD  ezetimibe (ZETIA) 10 MG tablet Take 1 tablet (10 mg total) by mouth daily. 05/20/23   Ivonne Andrew, NP  LIVALO 4 MG TABS Take 1 tablet by mouth once daily 04/07/23   Hilty, Lisette Abu, MD  metFORMIN (GLUCOPHAGE-XR) 500 MG 24 hr tablet Take 1 tablet (500 mg total) by mouth daily with breakfast. 05/20/23 08/18/23  Ivonne Andrew, NP  metoprolol tartrate (LOPRESSOR) 100 MG tablet Take 1 tablet (100 mg total) by mouth 2 (two) times daily. NEED OV. 05/20/23   Ivonne Andrew, NP  Multiple Vitamin (MULTIVITAMIN WITH MINERALS) TABS tablet Take 1 tablet by mouth daily. 08/13/22   Park Pope, MD  nitroGLYCERIN (NITROSTAT) 0.4 MG SL tablet Place 1 tablet (0.4 mg total) under the tongue every 5 (five) minutes x 3 doses as needed for chest pain. 07/18/21   Hilty, Lisette Abu, MD  Polyethyl Glycol-Propyl Glycol (SYSTANE) 0.4-0.3 % GEL ophthalmic gel Place 1 Application into both eyes daily as needed (For dry eyes).    [provider]  predniSONE (DELTASONE) 20 MG tablet Take 2 tablets (40 mg total) by mouth daily. Patient not taking: Reported on 07/15/2023 06/03/23   Mardella Layman, MD                                                                                                                                     Past Surgical History Past Surgical History:  Procedure Laterality Date   CARDIAC CATHETERIZATION  06/15/2002   LAD with prox 40%  stenosis, norma L main, Cfx with 25% lesion, RCA with long mid 25% stenosis (Dr. Daiva Nakayama)   CARDIAC CATHETERIZATION  04/01/2010   normal L main, LAD wit mild stenosis, L Cfx with 70% in-stent restenosis, RCA with 70% in-stent restenosis, LVEF >60% (Dr. K. Italy Hilty) - cutting ballon arthrectomy to RCA & Cfx (Dr. Langston Reusing)   CARDIAC CATHETERIZATION  08/25/2010   preserved global LV contractility; multivessel CAD, diffuse 90-95% in-stent restenosis in prox placed Cfx stent - cutting balloon arthrectomy in Cfx with multiple dilatations 90-95% to 0% stenosis (Dr. Bishop Limbo)   CARDIAC CATHETERIZATION  01/26/2011   PCI & stenting of aggresive in-stent restenosis within previously stented AV groove Cfx with 3.0x54mm Taxus DES (previous stents were Promus) (Dr. Erlene Quan)   CARDIAC CATHETERIZATION  05/11/2011   preserved LV function, 40% mid LAD stenosis, 30-40% narrowing proximal to stented semgnet of prox Cfx, patent mid RCA stent with smooth 20% narrowing in distal RCA (Dr. Bishop Limbo)   CARDIAC CATHETERIZATION  12/15/2011   PCI & stenting of proximal & mid Cfx with DES - 3.0x70mm in proximal, 3.0x71mm in mid (Dr. Erlene Quan)   CARDIAC CATHETERIZATION N/A 06/10/2015   Procedure: Left Heart Cath and Coronary Angiography;  Surgeon: Dolores Patty, MD;  Location[facility] INVASIVE CV LAB;  Service: Cardiovascular;  Laterality: N/A;   CARDIAC CATHETERIZATION  10/25/2018   cardiometabolic testing  08/08/2012   good exercise effort, peak VO2 79% predicted with normal VO2 HR curves (mild deconditioning)   COLONOSCOPY  12/2012   diminutive hyperplastic sigmoid poyp so repeat routine 2024   CORONARY BALLOON ANGIOPLASTY N/A 10/25/2018   Procedure: CORONARY BALLOON ANGIOPLASTY;  Surgeon:  Corky Crafts, MD;  Location: [facility] INVASIVE CV LAB;  Service: Cardiovascular;  Laterality: N/A;   CORONARY BALLOON ANGIOPLASTY N/A 09/29/2019   Procedure: CORONARY BALLOON ANGIOPLASTY;  Surgeon: Yvonne Kendall, MD;  Location: [facility] INVASIVE CV LAB;  Service: Cardiovascular;  Laterality: N/A;   CORONARY ULTRASOUND/IVUS N/A 09/29/2019   Procedure: Intravascular Ultrasound/IVUS;  Surgeon: Yvonne Kendall, MD;  Location: [facility] INVASIVE CV LAB;  Service: Cardiovascular;  Laterality: N/A;   EXCISIONAL HEMORRHOIDECTOMY  1984   LEFT HEART CATH AND CORONARY ANGIOGRAPHY N/A 10/25/2018   Procedure: LEFT HEART CATH AND CORONARY ANGIOGRAPHY;  Surgeon: Corky Crafts, MD;  Location: [facility] INVASIVE CV LAB;  Service: Cardiovascular;  Laterality: N/A;   LEFT HEART CATH AND CORONARY ANGIOGRAPHY N/A 09/29/2019   Procedure: LEFT HEART CATH AND CORONARY ANGIOGRAPHY;  Surgeon: Yvonne Kendall, MD;  Location: [facility] INVASIVE CV LAB;  Service: Cardiovascular;  Laterality: N/A;   LEFT HEART CATHETERIZATION WITH CORONARY ANGIOGRAM N/A 05/11/2011   Procedure: LEFT HEART CATHETERIZATION WITH CORONARY ANGIOGRAM;  Surgeon: Lennette Bihari, MD;  Location: [facility] CATH LAB;  Service: Cardiovascular;  Laterality: N/A;  Possible percutaneous coronary intervention, possible IVUS   LEFT HEART CATHETERIZATION WITH CORONARY ANGIOGRAM N/A 12/15/2011   Procedure: LEFT HEART CATHETERIZATION WITH CORONARY ANGIOGRAM;  Surgeon: Runell Gess, MD;  Location: [facility] CATH LAB;  Service: Cardiovascular;  Laterality: N/A;   LEFT HEART CATHETERIZATION WITH CORONARY ANGIOGRAM N/A 09/05/2014   Procedure: LEFT HEART CATHETERIZATION WITH CORONARY ANGIOGRAM;  Surgeon: Kathleene Hazel, MD;  Location: [facility] CATH LAB;  Service: Cardiovascular;  Laterality: N/A;   LIPOMA EXCISION     back of the head   NM MYOCAR PERF WALL MOTION  02/2012   lexiscan myoview; mild perfusion defect in mid inferolateral & basal inferolateral region (infarct/scar); EF 52%, abnormal but ow  risk  scan   PERCUTANEOUS CORONARY STENT INTERVENTION (PCI-S)  09/05/2014   Procedure: PERCUTANEOUS CORONARY STENT INTERVENTION (PCI-S);  Surgeon: Kathleene Hazel, MD;  Location: [facility] CATH LAB;  Service: Cardiovascular;;   TONSILLECTOMY     Family History Family History  Problem Relation Age of Onset   Leukemia Mother    Prostate cancer Father    Cancer Brother    Coronary artery disease Paternal Grandmother    Cancer Paternal Grandfather    Migraines Neg Hx    Headache Neg Hx    Colon cancer Neg Hx    Esophageal cancer Neg Hx    Rectal cancer Neg Hx    Stomach cancer Neg Hx     Social History Social History   Tobacco Use   Smoking status: Former    Current packs/day: 0.00    Average packs/day: 1 pack/day for 10.0 years (10.0 ttl pk-yrs)    Types: Cigarettes    Start date: 10/06/2008    Quit date: 10/07/2018    Years since quitting: 4.8   Smokeless tobacco: Never  Vaping Use   Vaping status: Never Used  Substance Use Topics   Alcohol use: Not Currently    Comment: 05/28/2023-stopped drinking 22 days ago.   Drug use: Not Currently    Types: Cocaine   Allergies Bee venom, Gadolinium, Shellfish allergy, Statins, and Testosterone cypionate  Review of Systems Review of Systems  All other systems reviewed and are negative.   Physical Exam Vital Signs  I have reviewed the triage vital signs BP 124/85   Pulse 78   Temp 97.7 F (36.5 C)   Resp 19   Ht 5\' 8"  (1.727 m)   Wt 88.5 kg   SpO2 93%   BMI 29.65 kg/m  Physical Exam Vitals and nursing note reviewed.  Constitutional:      General: He is not in acute distress.    Appearance: Normal appearance.  HENT:     Head: Normocephalic and atraumatic.     Mouth/Throat:     Mouth: Mucous membranes are moist.  Eyes:     Conjunctiva/sclera: Conjunctivae normal.  Cardiovascular:     Rate and Rhythm: Normal rate.  Pulmonary:     Effort: Pulmonary effort is normal. No respiratory distress.  Abdominal:      General: Abdomen is flat.  Genitourinary:    Comments: Chaperoned by RN, normal external male genitalia, tenderness over right testicle and epididymis, no masses, L testicle WNL  Skin:    General: Skin is warm and dry.     Capillary Refill: Capillary refill takes less than 2 seconds.  Neurological:     General: No focal deficit present.     Mental Status: He is alert. Mental status is at baseline.  Psychiatric:        Mood and Affect: Mood normal.        Behavior: Behavior normal.     ED Results and Treatments Labs (all labs ordered are listed, but only abnormal results are displayed) Labs Reviewed  COMPREHENSIVE METABOLIC PANEL - Abnormal; Notable for the following components:      Result Value   Glucose, Bld 137 (*)    All other components within normal limits  URINALYSIS, ROUTINE W REFLEX MICROSCOPIC - Abnormal; Notable for the following components:   Color, Urine AMBER (*)    APPearance HAZY (*)    Protein, ur 30 (*)    All other components within normal limits  CBC  GC/CHLAMYDIA PROBE AMP ([place]) NOT  AT [facility]                                                                                                                          Radiology No results found.  Pertinent labs & imaging results that were available during my care of the patient were reviewed by me and considered in my medical decision making (see MDM for details).  Medications Ordered in ED Medications - No data to display                                                                                                                                   Procedures Procedures  (including critical care time)  Medical Decision Making / ED Course   MDM:  63 y/o with R testicular pain.    [place] without signs of torsion, clear evidence of epididymitis. Does have b/l epididymal cyst. Given tenderness and feels similar to prior epididymitis however, will give course of abx. Advised f/u with his urologist. No  signs of UTI. No wounds. Low concern for STI but g/c probe sent.     Lab Tests: -I ordered, reviewed, and interpreted labs.   The pertinent results include:   Labs Reviewed  COMPREHENSIVE METABOLIC PANEL - Abnormal; Notable for the following components:      Result Value   Glucose, Bld 137 (*)    All other components within normal limits  URINALYSIS, ROUTINE W REFLEX MICROSCOPIC - Abnormal; Notable for the following components:   Color, Urine AMBER (*)    APPearance HAZY (*)    Protein, ur 30 (*)    All other components within normal limits  CBC  GC/CHLAMYDIA PROBE AMP ([place]) NOT AT [facility]    Notable for no UTI   Imaging Studies ordered: I ordered imaging studies including US scrotum On my interpretation imaging demonstrates no acute process I independently visualized and interpreted imaging. I agree with the radiologist interpretation   Medicines ordered and prescription drug management: Meds ordered this encounter  Medications   levofloxacin (LEVAQUIN) 500 MG tablet    Sig: Take 1 tablet (500 mg total) by mouth daily.    Dispense:  10 tablet    Refill:  0    -I have reviewed the patients home medicines and have made adjustments as needed   Co morbidities that complicate the patient evaluation  Past Medical History:  Diagnosis Date  Alcohol dependence ([name])    Allergies    Arthritis    Chest pain    Chronic lower back pain    Chronic pain of right wrist    Cocaine abuse ([name])    per note on 01/18/23   Coronary artery disease    a. Multiple prior caths/PCI. Cath 2013 with possible spasm of RCA, 70% ISR of mid LCx with subsequent DES to mLCx and prox LCX. b. H/o microvascular angina. c. Recurrent angina 08/2014 - s/p PTCA/DES to prox Cx, PTCA/CBA to OM1.  c. LHC 06/10/15 with patent stents and some ISR in LCX and OM-1 that was not flow limiting --> Rx    Dyslipidemia    a. Intolerant to many statins except tolerating Livalo.   GERD (gastroesophageal reflux  disease)    H/O cardiac catheterization 10/25/2018   Heart attack [facility])    Hypertension    Myocardial infarction [facility]) ~ 2010   Renal mass    S/P angioplasty with stent, DES, to proximal and mid LCX 12/15/11 12/15/2011   S/P foot surgery, right 04/2021   Shoulder pain    Stroke [facility])    pt. reports had a stroke around time of MI 2010   Type II diabetes mellitus ([name])    Unstable angina ([name])       Dispostion: Disposition decision including need for hospitalization was considered, and patient discharged from emergency department.    Final Clinical Impression(s) / ED Diagnoses Final diagnoses:  Testicular pain, right     This chart was dictated using voice recognition software.  Despite best efforts to proofread,  errors can occur which can change the documentation meaning.    Lonell Grandchild, MD 08/15/23 [phone]

## 2023-08-16 ENCOUNTER — Telehealth: Payer: Self-pay

## 2023-08-16 LAB — GC/CHLAMYDIA PROBE AMP (~~LOC~~) NOT AT ARMC
Chlamydia: NEGATIVE
Comment: NEGATIVE
Comment: NORMAL
Neisseria Gonorrhea: NEGATIVE

## 2023-08-16 NOTE — Transitions of Care (Post Inpatient/ED Visit) (Signed)
08/16/2023  Name: [name] MRN: [id number] DOB: 05-Mar-1961  Today's TOC FU Call Status:   Patient's Name and Date of Birth confirmed.  Transition Care Management Follow-up Telephone Call Date of Discharge: 08/13/23 Discharge Facility: [facility]) Type of Discharge: Emergency Department Reason for ED Visit: Other: How have you been since you were released from the hospital?: Worse Any questions or concerns?: No  Items Reviewed: Did you receive and understand the discharge instructions provided?: Yes Medications obtained,verified, and reconciled?: Yes (Medications Reviewed) Any new allergies since your discharge?: No Dietary orders reviewed?: No Do you have support at home?: No  Medications Reviewed Today: Medications Reviewed Today     Reviewed by Veneta Penton, CMA (Certified Medical Assistant) on 08/16/23 at [phone]  Med List Status: <None>   Medication Order Taking? Sig Documenting Provider Last Dose Status Informant  ACCU-CHEK GUIDE TEST test strip [id number] Yes USE 1 STRIP TO CHECK GLUCOSE THREE TIMES DAILY AS DIRECTED Ivonne Andrew, NP Taking Active   amLODipine (NORVASC) 10 MG tablet [id number] Yes Take 1 tablet (10 mg total) by mouth daily. Ivonne Andrew, NP Taking Active   amoxicillin-clavulanate (AUGMENTIN) 875-125 MG tablet [id number] No Take 1 tablet by mouth every 12 (twelve) hours.  Patient not taking: Reported on 08/16/2023   Wynonia Lawman A, NP Not Taking Active   aspirin EC 81 MG tablet [id number] Yes Take by mouth. [provider] Taking Active   Cholecalciferol 25 MCG (1000 UT) tablet [id number] Yes Take 1,000 Units by mouth daily. [provider] Taking Active Multiple Informants  clopidogrel (PLAVIX) 75 MG tablet [id number] Yes Take 1 tablet by mouth once daily Ivonne Andrew, NP Taking Active   cyclobenzaprine (FLEXERIL) 10 MG tablet [id number] No Take 1 tablet by mouth 3 times daily as needed for muscle spasm. Warning: May  cause drowsiness.  Patient not taking: Reported on 08/16/2023   Mardella Layman, MD Not Taking Active   EPINEPHrine 0.3 mg/0.3 mL IJ SOAJ injection [id number] No Inject 0.3 mg into the muscle as needed for anaphylaxis.  Patient not taking: Reported on 08/16/2023   Verlee Monte, MD Not Taking Active Multiple Informants  ezetimibe (ZETIA) 10 MG tablet [id number] Yes Take 1 tablet (10 mg total) by mouth daily. Ivonne Andrew, NP Taking Active   levofloxacin (LEVAQUIN) 500 MG tablet [id number] Yes Take 1 tablet (500 mg total) by mouth daily. Lonell Grandchild, MD Taking Active   LIVALO 4 MG TABS [id number] Yes Take 1 tablet by mouth once daily Hilty, Lisette Abu, MD Taking Active   metFORMIN (GLUCOPHAGE-XR) 500 MG 24 hr tablet [id number] Yes Take 1 tablet (500 mg total) by mouth daily with breakfast. Ivonne Andrew, NP Taking Active   metoprolol tartrate (LOPRESSOR) 100 MG tablet [id number] Yes Take 1 tablet (100 mg total) by mouth 2 (two) times daily. NEED OV. Ivonne Andrew, NP Taking Active   Multiple Vitamin (MULTIVITAMIN WITH MINERALS) TABS tablet [id number] Yes Take 1 tablet by mouth daily. Park Pope, MD Taking Active Multiple Informants  nitroGLYCERIN (NITROSTAT) 0.4 MG SL tablet [id number] No Place 1 tablet (0.4 mg total) under the tongue every 5 (five) minutes x 3 doses as needed for chest pain.  Patient not taking: Reported on 08/16/2023   Chrystie Nose, MD Not Taking Active Multiple Informants  Polyethyl Glycol-Propyl Glycol (SYSTANE) 0.4-0.3 % GEL ophthalmic gel [id number] Yes Place 1 Application into both eyes daily as needed (For dry eyes). [provider] Taking Active  Multiple Informants  predniSONE (DELTASONE) 20 MG tablet [id number] No Take 2 tablets (40 mg total) by mouth daily.  Patient not taking: Reported on 08/16/2023   Mardella Layman, MD Not Taking Active             Home Care and Equipment/Supplies: Were Home Health Services Ordered?: No Any new equipment or  medical supplies ordered?: No  Functional Questionnaire: Do you need assistance with bathing/showering or dressing?: No Do you need assistance with meal preparation?: No Do you need assistance with eating?: No Do you have difficulty maintaining continence: No Do you need assistance with getting out of bed/getting out of a chair/moving?: No Do you have difficulty managing or taking your medications?: No  Follow up appointments reviewed: PCP Follow-up appointment confirmed?: Yes Date of PCP follow-up appointment?: 08/27/23 Follow-up Provider: Angus Seller Specialist [facility] Follow-up appointment confirmed?: Yes Date of Specialist follow-up appointment?: 12/23/23 Follow-Up Specialty Provider:: Urologist Do you need transportation to your follow-up appointment?: No Do you understand care options if your condition(s) worsen?: Yes-patient verbalized understanding    SIGNATURE Zacharey Jensen, CMA

## 2023-08-17 ENCOUNTER — Telehealth: Payer: Self-pay | Admitting: Nurse Practitioner

## 2023-08-17 NOTE — Telephone Encounter (Signed)
Type of order/referral and detailed reason for visit: Urology - 1st appt not for months. Patient states he was advised ton contact provider and request an urgent referral so he is able to be seen sooner due to patient recently seen at [facility].

## 2023-08-17 NOTE — Telephone Encounter (Signed)
 Error

## 2023-08-18 ENCOUNTER — Other Ambulatory Visit: Payer: Self-pay | Admitting: Nurse Practitioner

## 2023-08-18 DIAGNOSIS — N50811 Right testicular pain: Secondary | ICD-10-CM

## 2023-08-19 NOTE — Telephone Encounter (Signed)
Spoke to pt and changed referral to urology in [place]. Pt was advise of address and to look for a call from their office [facility]

## 2023-08-20 ENCOUNTER — Ambulatory Visit: Payer: Self-pay | Admitting: Nurse Practitioner

## 2023-08-26 ENCOUNTER — Ambulatory Visit (INDEPENDENT_AMBULATORY_CARE_PROVIDER_SITE_OTHER): Payer: MEDICAID | Admitting: Urology

## 2023-08-26 ENCOUNTER — Ambulatory Visit: Payer: MEDICAID | Admitting: Urology

## 2023-08-26 ENCOUNTER — Encounter: Payer: Self-pay | Admitting: Urology

## 2023-08-26 VITALS — BP 134/94 | HR 64 | Ht 68.0 in | Wt 195.0 lb

## 2023-08-26 DIAGNOSIS — N5082 Scrotal pain: Secondary | ICD-10-CM | POA: Insufficient documentation

## 2023-08-26 DIAGNOSIS — N451 Epididymitis: Secondary | ICD-10-CM | POA: Insufficient documentation

## 2023-08-26 DIAGNOSIS — N281 Cyst of kidney, acquired: Secondary | ICD-10-CM | POA: Insufficient documentation

## 2023-08-26 LAB — MICROSCOPIC EXAMINATION

## 2023-08-26 LAB — URINALYSIS, ROUTINE W REFLEX MICROSCOPIC
Bilirubin, UA: NEGATIVE
Glucose, UA: NEGATIVE
Leukocytes,UA: NEGATIVE
Nitrite, UA: NEGATIVE
RBC, UA: NEGATIVE
Specific Gravity, UA: 1.025 (ref 1.005–1.030)
Urobilinogen, Ur: 1 mg/dL (ref 0.2–1.0)
pH, UA: 5.5 (ref 5.0–7.5)

## 2023-08-26 MED ORDER — DOXYCYCLINE HYCLATE 100 MG PO CAPS: 100.0000 mg | ORAL_CAPSULE | Freq: Two times a day (BID) | ORAL | 0 refills | Status: AC

## 2023-08-26 MED ORDER — MELOXICAM 7.5 MG PO TABS: 7.5000 mg | ORAL_TABLET | Freq: Every day | ORAL | 0 refills | Status: DC

## 2023-08-26 NOTE — Progress Notes (Signed)
 Assessment: 1. Epididymitis, right   2. Scrotal pain, right   3. Complex renal cyst, right, Bosniak IIF    Plan: I personally reviewed the patient's chart including provider notes, labs and imaging results. His symptoms are consistent with right epididymitis. Recommend a course of doxycycline 100 mg twice daily x 2 weeks and daily anti-inflammatory.  I reviewed reviewed the CT and MRI results as noted below.  I discussed these findings showing a Bosniak 78F cyst in the right kidney with the patient.  Recommendations for follow-up imaging discussed. Will order a MRI of the abdomen with and without contrast.  Return to office in 1 month.  Chief Complaint:  Chief Complaint  Patient presents with   Testicle Pain    History of Present Illness:  [name] is a 63 y.o. male who is seen in consultation from Ivonne Andrew, NP for evaluation of right scrotal and groin pain. He was seen at urgent care in November 2024 with pain in the right testicle and right groin. Scrotal ultrasound from 05/18/2023 demonstrated mild heterogeneity of the testicles without discrete mass, bilateral epididymal cysts.  He was treated with antibiotics and his symptoms improved.  He noted a return of his symptoms for approximately 1 month and was seen in the emergency room on 08/13/2023 for evaluation.   Scrotal ultrasound from 08/13/2023 demonstrated normal blood flow bilaterally with normal-appearing testes, bilateral epididymal cyst, mild right varicocele. He was again treated with Levaquin x 10 days.  He has not seen a significant change in his symptoms.  He continues with fairly constant discomfort in the right scrotum with radiation into the right groin area.  No scrotal swelling or redness.  No history of scrotal trauma.  His symptoms are worsened with certain movement and position. He has some lower urinary tract symptoms including frequency, weak stream, incomplete emptying.  No dysuria or gross  hematuria.  He was previously seen by Dr. Berneice Heinrich in August 2015 for right sided scrotal pain.  Scrotal ultrasound at that time demonstrated epididymal cysts cysts and small bilateral varicoceles. He underwent evaluation with a CT scan in May 2024 for abdominal pain.This showed an indeterminate lesion in the right kidney measuring 13 mm in size.  He was subsequently evaluated with an MRI in September 2024 which showed a 1.3 x 1.0 cm complex lesion in the right kidney with hyper intense fluid signal and enhancing septations consistent with a Bosniak 78F cyst.  Further imaging at 6 months recommended.  Past Medical History:  Past Medical History:  Diagnosis Date   Alcohol dependence ([name])    Allergies    Arthritis    Chest pain    Chronic lower back pain    Chronic pain of right wrist    Cocaine abuse ([name])    per note on 01/18/23   Coronary artery disease    a. Multiple prior caths/PCI. Cath 2013 with possible spasm of RCA, 70% ISR of mid LCx with subsequent DES to mLCx and prox LCX. b. H/o microvascular angina. c. Recurrent angina 08/2014 - s/p PTCA/DES to prox Cx, PTCA/CBA to OM1.  c. LHC 06/10/15 with patent stents and some ISR in LCX and OM-1 that was not flow limiting --> Rx    Dyslipidemia    a. Intolerant to many statins except tolerating Livalo.   GERD (gastroesophageal reflux disease)    H/O cardiac catheterization 10/25/2018   Heart attack [facility])    Hypertension    Myocardial infarction ([name]) ~ 2010  Renal mass    S/P angioplasty with stent, DES, to proximal and mid LCX 12/15/11 12/15/2011   S/P foot surgery, right 04/2021   Shoulder pain    Stroke ([name])    pt. reports had a stroke around time of MI 2010   Type II diabetes mellitus ([name])    Unstable angina ([name])     Past Surgical History:  Past Surgical History:  Procedure Laterality Date   CARDIAC CATHETERIZATION  06/15/2002   LAD with prox 40% stenosis, norma L main, Cfx with 25% lesion, RCA with long mid 25% stenosis (Dr.  Daiva Nakayama)   CARDIAC CATHETERIZATION  04/01/2010   normal L main, LAD wit mild stenosis, L Cfx with 70% in-stent restenosis, RCA with 70% in-stent restenosis, LVEF >60% (Dr. K. Italy Hilty) - cutting ballon arthrectomy to RCA & Cfx (Dr. Langston Reusing)   CARDIAC CATHETERIZATION  08/25/2010   preserved global LV contractility; multivessel CAD, diffuse 90-95% in-stent restenosis in prox placed Cfx stent - cutting balloon arthrectomy in Cfx with multiple dilatations 90-95% to 0% stenosis (Dr. Bishop Limbo)   CARDIAC CATHETERIZATION  01/26/2011   PCI & stenting of aggresive in-stent restenosis within previously stented AV groove Cfx with 3.0x23mm Taxus DES (previous stents were Promus) (Dr. Erlene Quan)   CARDIAC CATHETERIZATION  05/11/2011   preserved LV function, 40% mid LAD stenosis, 30-40% narrowing proximal to stented semgnet of prox Cfx, patent mid RCA stent with smooth 20% narrowing in distal RCA (Dr. Bishop Limbo)   CARDIAC CATHETERIZATION  12/15/2011   PCI & stenting of proximal & mid Cfx with DES - 3.0x2mm in proximal, 3.0x25mm in mid (Dr. Erlene Quan)   CARDIAC CATHETERIZATION N/A 06/10/2015   Procedure: Left Heart Cath and Coronary Angiography;  Surgeon: Dolores Patty, MD;  Location: [facility] INVASIVE CV LAB;  Service: Cardiovascular;  Laterality: N/A;   CARDIAC CATHETERIZATION  10/25/2018   cardiometabolic testing  08/08/2012   good exercise effort, peak VO2 79% predicted with normal VO2 HR curves (mild deconditioning)   COLONOSCOPY  12/2012   diminutive hyperplastic sigmoid poyp so repeat routine 2024   CORONARY BALLOON ANGIOPLASTY N/A 10/25/2018   Procedure: CORONARY BALLOON ANGIOPLASTY;  Surgeon: Corky Crafts, MD;  Location: [facility] INVASIVE CV LAB;  Service: Cardiovascular;  Laterality: N/A;   CORONARY BALLOON ANGIOPLASTY N/A 09/29/2019   Procedure: CORONARY BALLOON ANGIOPLASTY;  Surgeon: Yvonne Kendall, MD;  Location: [facility] INVASIVE CV LAB;  Service: Cardiovascular;  Laterality: N/A;   CORONARY  ULTRASOUND/IVUS N/A 09/29/2019   Procedure: Intravascular Ultrasound/IVUS;  Surgeon: Yvonne Kendall, MD;  Location: [facility] INVASIVE CV LAB;  Service: Cardiovascular;  Laterality: N/A;   EXCISIONAL HEMORRHOIDECTOMY  1984   LEFT HEART CATH AND CORONARY ANGIOGRAPHY N/A 10/25/2018   Procedure: LEFT HEART CATH AND CORONARY ANGIOGRAPHY;  Surgeon: Corky Crafts, MD;  Location: [facility] INVASIVE CV LAB;  Service: Cardiovascular;  Laterality: N/A;   LEFT HEART CATH AND CORONARY ANGIOGRAPHY N/A 09/29/2019   Procedure: LEFT HEART CATH AND CORONARY ANGIOGRAPHY;  Surgeon: Yvonne Kendall, MD;  Location: [facility] INVASIVE CV LAB;  Service: Cardiovascular;  Laterality: N/A;   LEFT HEART CATHETERIZATION WITH CORONARY ANGIOGRAM N/A 05/11/2011   Procedure: LEFT HEART CATHETERIZATION WITH CORONARY ANGIOGRAM;  Surgeon: Lennette Bihari, MD;  Location: [facility] CATH LAB;  Service: Cardiovascular;  Laterality: N/A;  Possible percutaneous coronary intervention, possible IVUS   LEFT HEART CATHETERIZATION WITH CORONARY ANGIOGRAM N/A 12/15/2011   Procedure: LEFT HEART CATHETERIZATION WITH CORONARY ANGIOGRAM;  Surgeon: Runell Gess, MD;  Location: [facility]  CATH LAB;  Service: Cardiovascular;  Laterality: N/A;   LEFT HEART CATHETERIZATION WITH CORONARY ANGIOGRAM N/A 09/05/2014   Procedure: LEFT HEART CATHETERIZATION WITH CORONARY ANGIOGRAM;  Surgeon: Kathleene Hazel, MD;  Location: [facility] CATH LAB;  Service: Cardiovascular;  Laterality: N/A;   LIPOMA EXCISION     back of the head   NM MYOCAR PERF WALL MOTION  02/2012   lexiscan myoview; mild perfusion defect in mid inferolateral & basal inferolateral region (infarct/scar); EF 52%, abnormal but ow risk scan   PERCUTANEOUS CORONARY STENT INTERVENTION (PCI-S)  09/05/2014   Procedure: PERCUTANEOUS CORONARY STENT INTERVENTION (PCI-S);  Surgeon: Kathleene Hazel, MD;  Location: [facility] CATH LAB;  Service: Cardiovascular;;   TONSILLECTOMY      Allergies:  Allergies  Allergen Reactions   Bee Venom  Anaphylaxis, Hives, Itching and Other (See Comments)    Red eyes   Gadolinium Hives, Itching, Swelling and Other (See Comments)    Swelling of eyes after receiving MR contrast. 13 hr prep recommended. Pt developed large hive and swelling under right eye after contrast, given 50mg  iv benadryl, pt will need premed before gadolinium//lh   Shellfish Allergy Anaphylaxis, Hives, Itching and Other (See Comments)    Red eyes   Statins Other (See Comments)    Myalgias. Tolerating livalo. Pain    Testosterone Cypionate Other (See Comments)    Testerone Injection --Increased breast tissue     Family History:  Family History  Problem Relation Age of Onset   Leukemia Mother    Prostate cancer Father    Cancer Brother    Coronary artery disease Paternal Grandmother    Cancer Paternal Grandfather    Migraines Neg Hx    Headache Neg Hx    Colon cancer Neg Hx    Esophageal cancer Neg Hx    Rectal cancer Neg Hx    Stomach cancer Neg Hx     Social History:  Social History   Tobacco Use   Smoking status: Former    Current packs/day: 0.00    Average packs/day: 1 pack/day for 10.0 years (10.0 ttl pk-yrs)    Types: Cigarettes    Start date: 10/06/2008    Quit date: 10/07/2018    Years since quitting: 4.8   Smokeless tobacco: Never  Vaping Use   Vaping status: Never Used  Substance Use Topics   Alcohol use: Not Currently    Comment: 05/28/2023-stopped drinking 22 days ago.   Drug use: Not Currently    Types: Cocaine    Review of symptoms:  Constitutional:  Negative for unexplained weight loss, night sweats, fever, chills ENT:  Negative for nose bleeds, sinus pain, painful swallowing CV:  Negative for chest pain, shortness of breath, exercise intolerance, palpitations, loss of consciousness Resp:  Negative for cough, wheezing, shortness of breath GI:  Negative for nausea, vomiting, diarrhea, bloody stools GU:  Positives noted in HPI; otherwise negative for gross hematuria, dysuria,  urinary incontinence Neuro:  Negative for seizures, poor balance, limb weakness, slurred speech Psych:  Negative for lack of energy, depression, anxiety Endocrine:  Negative for polydipsia, polyuria, symptoms of hypoglycemia (dizziness, hunger, sweating) Hematologic:  Negative for anemia, purpura, petechia, prolonged or excessive bleeding, use of anticoagulants  Allergic:  Negative for difficulty breathing or choking as a result of exposure to anything; no shellfish allergy; no allergic response (rash/itch) to materials, foods  Physical exam: BP (!) 134/94   Pulse 64   Ht 5\' 8"  (1.727 m)   Wt 195 lb (88.5 kg)  BMI 29.65 kg/m  GENERAL APPEARANCE:  Well appearing, well developed, well nourished, NAD HEENT: Atraumatic, Normocephalic, oropharynx clear. NECK: Supple without lymphadenopathy or thyromegaly. LUNGS: Clear to auscultation bilaterally. HEART: Regular Rate and Rhythm without murmurs, gallops, or rubs. ABDOMEN: Soft, non-tender, No Masses. EXTREMITIES: Moves all extremities well.  Without clubbing, cyanosis, or edema. NEUROLOGIC:  Alert and oriented x 3, normal gait, CN II-XII grossly intact.  MENTAL STATUS:  Appropriate. BACK:  Non-tender to palpation.  No CVAT SKIN:  Warm, dry and intact.   GU: Penis:  circumcised Meatus: Normal Scrotum: no erythema or edema; no hernia palpated bilaterally Testis: NT, no masses bilaterally Epididymis: tender to palpation on right   Results: U/A:-5 WBCs, 0-2 RBCs, few bacteria

## 2023-08-27 ENCOUNTER — Other Ambulatory Visit: Payer: Self-pay | Admitting: Urology

## 2023-08-27 ENCOUNTER — Encounter: Payer: Self-pay | Admitting: Nurse Practitioner

## 2023-08-27 ENCOUNTER — Ambulatory Visit (INDEPENDENT_AMBULATORY_CARE_PROVIDER_SITE_OTHER): Payer: MEDICAID | Admitting: Nurse Practitioner

## 2023-08-27 ENCOUNTER — Telehealth: Payer: Self-pay | Admitting: Urology

## 2023-08-27 VITALS — BP 131/84 | HR 62 | Temp 97.5°F | Wt 195.4 lb

## 2023-08-27 DIAGNOSIS — E1159 Type 2 diabetes mellitus with other circulatory complications: Secondary | ICD-10-CM | POA: Diagnosis not present

## 2023-08-27 LAB — POCT GLYCOSYLATED HEMOGLOBIN (HGB A1C): Hemoglobin A1C: 7.1 % — AB (ref 4.0–5.6)

## 2023-08-27 MED ORDER — PREDNISONE 50 MG PO TABS: ORAL_TABLET | ORAL | 0 refills | Status: DC

## 2023-08-27 MED ORDER — DIPHENHYDRAMINE HCL 50 MG PO TABS: ORAL_TABLET | ORAL | 0 refills | Status: DC

## 2023-08-27 NOTE — Telephone Encounter (Signed)
 Allergic to the MRI contrast and will need to be medicated. He is scheduled for 03/07. But according to imaging, he will be 13 hour premeded prior to appt

## 2023-08-27 NOTE — Progress Notes (Signed)
 Subjective   Patient ID: [name], male    DOB: July 31, 1960, 63 y.o.   MRN: [id number]  Chief Complaint  Patient presents with   Diabetes    Patient stated that his sugars been high due to his work.     Referring provider: Ivonne Andrew, NP  [name] is a 63 y.o. male with Past Medical History: No date: Alcohol dependence ([name]) No date: Allergies No date: Arthritis No date: Chest pain No date: Chronic lower back pain No date: Chronic pain of right wrist No date: Cocaine abuse ([name])     Comment:  per note on 01/18/23 No date: Coronary artery disease     Comment:  a. Multiple prior caths/PCI. Cath 2013 with possible               spasm of RCA, 70% ISR of mid LCx with subsequent DES to               mLCx and prox LCX. b. H/o microvascular angina. c.               Recurrent angina 08/2014 - s/p PTCA/DES to prox Cx,               PTCA/CBA to OM1.  c. LHC 06/10/15 with patent stents and               some ISR in LCX and OM-1 that was not flow limiting -->               Rx  No date: Dyslipidemia     Comment:  a. Intolerant to many statins except tolerating Livalo. No date: GERD (gastroesophageal reflux disease) 10/25/2018: H/O cardiac catheterization No date: Heart attack [facility]) No date: Hypertension ~ 2010: Myocardial infarction ([name]) No date: Renal mass 12/15/2011: S/P angioplasty with stent, DES, to proximal and mid LCX  12/15/11 04/2021: S/P foot surgery, right No date: Shoulder pain No date: Stroke [facility])     Comment:  pt. reports had a stroke around time of MI 2010 No date: Type II diabetes mellitus ([name]) No date: Unstable angina ([name])   HPI  Diabetes:   [name] is in today for diabetes follow up. The prescribed treatment is metformin 500 mg  along with therapy zetia. The reported use of treatment is consistent with prescribed. He denies reported side effects from the treatment. Home glucose monitoring indicates a CBG normal range. His goal is to  consisently eating healthy.  He states that he has not been doing well with diet or exercise. There has been a professional eye exam in the year. A1C in office today was 7.1.    He reports that he has note taken his medication. He continues to take metoprolol 100 mg BID. He does not monitor BP or HR at home. Denies f/c/s, n/v/d, hemoptysis, PND, leg swelling Denies chest pain or edema     Note: Cardiology - refilling htn medications     Allergies  Allergen Reactions   Bee Venom Anaphylaxis, Hives, Itching and Other (See Comments)    Red eyes   Gadolinium Hives, Itching, Swelling and Other (See Comments)    Swelling of eyes after receiving MR contrast. 13 hr prep recommended. Pt developed large hive and swelling under right eye after contrast, given 50mg  iv benadryl, pt will need premed before gadolinium//lh   Shellfish Allergy Anaphylaxis, Hives, Itching and Other (See Comments)    Red eyes   Statins Other (See Comments)  Myalgias. Tolerating livalo. Pain    Testosterone Cypionate Other (See Comments)    Testerone Injection --Increased breast tissue     Immunization History  Administered Date(s) Administered   Influenza,inj,Quad PF,6+ Mos 07/28/2017, 04/01/2018, 03/05/2021   MODERNA COVID-19 SARS-COV-2 PEDS BIVALENT BOOSTER 32yr-23yr 12/09/2019, 01/10/2020   Moderna SARS-COV2 Booster Vaccination 08/13/2020   Pneumococcal Polysaccharide-23 07/28/2017, 03/05/2021   Pneumococcal-Unspecified 06/29/2009   Tdap 02/08/2020   Zoster Recombinant(Shingrix) 01/11/2013    Tobacco History: Social History   Tobacco Use  Smoking Status Former   Current packs/day: 0.00   Average packs/day: 1 pack/day for 10.0 years (10.0 ttl pk-yrs)   Types: Cigarettes   Start date: 10/06/2008   Quit date: 10/07/2018   Years since quitting: 4.8  Smokeless Tobacco Never   Counseling given: Not Answered   Outpatient Encounter Medications as of 08/27/2023  Medication Sig   ACCU-CHEK GUIDE TEST test  strip USE 1 STRIP TO CHECK GLUCOSE THREE TIMES DAILY AS DIRECTED   amLODipine (NORVASC) 10 MG tablet Take 1 tablet (10 mg total) by mouth daily.   aspirin EC 81 MG tablet Take by mouth.   Cholecalciferol 25 MCG (1000 UT) tablet Take 1,000 Units by mouth daily.   clopidogrel (PLAVIX) 75 MG tablet Take 1 tablet by mouth once daily   cyclobenzaprine (FLEXERIL) 10 MG tablet Take 1 tablet by mouth 3 times daily as needed for muscle spasm. Warning: May cause drowsiness.   doxycycline (VIBRAMYCIN) 100 MG capsule Take 1 capsule (100 mg total) by mouth every 12 (twelve) hours for 14 days.   EPINEPHrine 0.3 mg/0.3 mL IJ SOAJ injection Inject 0.3 mg into the muscle as needed for anaphylaxis.   ezetimibe (ZETIA) 10 MG tablet Take 1 tablet (10 mg total) by mouth daily.   LIVALO 4 MG TABS Take 1 tablet by mouth once daily   meloxicam (MOBIC) 7.5 MG tablet Take 1 tablet (7.5 mg total) by mouth daily.   metoprolol tartrate (LOPRESSOR) 100 MG tablet Take 1 tablet (100 mg total) by mouth 2 (two) times daily. NEED OV.   Multiple Vitamin (MULTIVITAMIN WITH MINERALS) TABS tablet Take 1 tablet by mouth daily.   nitroGLYCERIN (NITROSTAT) 0.4 MG SL tablet Place 1 tablet (0.4 mg total) under the tongue every 5 (five) minutes x 3 doses as needed for chest pain.   Polyethyl Glycol-Propyl Glycol (SYSTANE) 0.4-0.3 % GEL ophthalmic gel Place 1 Application into both eyes daily as needed (For dry eyes).   metFORMIN (GLUCOPHAGE-XR) 500 MG 24 hr tablet Take 1 tablet (500 mg total) by mouth daily with breakfast.   No facility-administered encounter medications on file as of 08/27/2023.    Review of Systems  Review of Systems  Constitutional: Negative.   HENT: Negative.    Cardiovascular: Negative.   Gastrointestinal: Negative.   Allergic/Immunologic: Negative.   Neurological: Negative.   Psychiatric/Behavioral: Negative.       Objective:   BP 131/84   Pulse 62   Temp (!) 97.5 F (36.4 C)   Wt 195 lb 6.4 oz (88.6  kg)   SpO2 100%   BMI 29.71 kg/m   Wt Readings from Last 5 Encounters:  08/27/23 195 lb 6.4 oz (88.6 kg)  08/26/23 195 lb (88.5 kg)  08/13/23 195 lb (88.5 kg)  06/11/23 195 lb 9.6 oz (88.7 kg)  05/20/23 195 lb (88.5 kg)     Physical Exam Vitals and nursing note reviewed.  Constitutional:      General: He is not in acute distress.  Appearance: He is well-developed.  Cardiovascular:     Rate and Rhythm: Normal rate and regular rhythm.  Pulmonary:     Effort: Pulmonary effort is normal.     Breath sounds: Normal breath sounds.  Skin:    General: Skin is warm and dry.  Neurological:     Mental Status: He is alert and oriented to person, place, and time.       Assessment & Plan:   Type 2 diabetes mellitus with other circulatory complication, without long-term current use of insulin ([name]) -     POCT glycosylated hemoglobin (Hb A1C)     Return in about 3 months (around 11/24/2023).   Ivonne Andrew, NP 08/27/2023

## 2023-08-27 NOTE — Patient Instructions (Signed)
 1. Type 2 diabetes mellitus with other circulatory complication, without long-term current use of insulin ([name]) (Primary)  - POCT glycosylated hemoglobin (Hb A1C)  Follow up:  Follow up in 3 months

## 2023-08-30 ENCOUNTER — Telehealth: Payer: Self-pay | Admitting: Nurse Practitioner

## 2023-08-30 ENCOUNTER — Telehealth: Payer: Self-pay

## 2023-08-30 ENCOUNTER — Ambulatory Visit: Payer: MEDICAID | Admitting: Urology

## 2023-08-30 NOTE — Telephone Encounter (Signed)
 Copied from CRM [id number]. Topic: General - Other >> Aug 30, 2023  9:56 AM Ebonie J wrote: Reason for CRM: Pt returned call to [facility], He stated he's currently unavailable in route and to call him back in the next hour if possible. Best call back [phone]

## 2023-08-30 NOTE — Telephone Encounter (Signed)
 Copied from CRM [id number]. Topic: General - Other >> Aug 27, 2023  3:44 PM Santiya F wrote: Reason for CRM: Patient is calling in requesting to speak with Charisse.

## 2023-08-30 NOTE — Telephone Encounter (Signed)
 Notified pt of instructions. Pt request to send the instructions via mychart for reference. Instructions sent.

## 2023-08-31 ENCOUNTER — Telehealth: Payer: Self-pay | Admitting: Urology

## 2023-08-31 NOTE — Telephone Encounter (Signed)
 Called and lvm to get pt to sent a photo imagine of his insurance card.

## 2023-09-03 ENCOUNTER — Ambulatory Visit (HOSPITAL_COMMUNITY)
Admission: RE | Admit: 2023-09-03 | Discharge: 2023-09-03 | Disposition: A | Discharge status: Home / Self Care | Payer: MEDICAID | Source: Ambulatory Visit | Attending: Urology | Admitting: Urology

## 2023-09-03 DIAGNOSIS — N281 Cyst of kidney, acquired: Secondary | ICD-10-CM | POA: Insufficient documentation

## 2023-09-03 MED ORDER — GADOBUTROL 1 MMOL/ML IV SOLN: 8.0000 mL | Freq: Once | INTRAVENOUS | Status: DC | PRN

## 2023-09-03 MED ORDER — GADOBUTROL 1 MMOL/ML IV SOLN
9.0000 mL | Freq: Once | INTRAVENOUS | Status: AC | PRN
  Administered 2023-09-03: 9 mL via INTRAVENOUS

## 2023-09-09 ENCOUNTER — Ambulatory Visit: Payer: MEDICAID | Attending: Physician Assistant | Admitting: Physical Therapy

## 2023-09-09 ENCOUNTER — Encounter: Payer: Self-pay | Admitting: Physical Therapy

## 2023-09-09 DIAGNOSIS — M25511 Pain in right shoulder: Secondary | ICD-10-CM | POA: Insufficient documentation

## 2023-09-09 NOTE — Therapy (Signed)
 OUTPATIENT PHYSICAL THERAPY SHOULDER Re-EVALUATION   Patient Name: [name] MRN: [id number] DOB:May 05, 1961, 63 y.o., male Today's Date: 09/09/2023  END OF SESSION:  PT End of Session - 09/09/23 0805     Visit Number 4    Number of Visits 10    Date for PT Re-Evaluation 10/21/23    Authorization Type [employer]    Authorization Time Period 1/20-3/8 , 6 visits. Auth requested 09/09/23    PT Start Time 0800    PT Stop Time 0845    PT Time Calculation (min) 45 min    Activity Tolerance Patient tolerated treatment well    Behavior During Therapy [facility] for tasks assessed/performed              Past Medical History:  Diagnosis Date   Alcohol dependence ([name])    Allergies    Arthritis    Chest pain    Chronic lower back pain    Chronic pain of right wrist    Cocaine abuse ([name])    per note on 01/18/23   Coronary artery disease    a. Multiple prior caths/PCI. Cath 2013 with possible spasm of RCA, 70% ISR of mid LCx with subsequent DES to mLCx and prox LCX. b. H/o microvascular angina. c. Recurrent angina 08/2014 - s/p PTCA/DES to prox Cx, PTCA/CBA to OM1.  c. LHC 06/10/15 with patent stents and some ISR in LCX and OM-1 that was not flow limiting --> Rx    Dyslipidemia    a. Intolerant to many statins except tolerating Livalo.   GERD (gastroesophageal reflux disease)    H/O cardiac catheterization 10/25/2018   Heart attack [facility])    Hypertension    Myocardial infarction [facility]) ~ 2010   Renal mass    S/P angioplasty with stent, DES, to proximal and mid LCX 12/15/11 12/15/2011   S/P foot surgery, right 04/2021   Shoulder pain    Stroke ([name])    pt. reports had a stroke around time of MI 2010   Type II diabetes mellitus ([name])    Unstable angina [facility])    Past Surgical History:  Procedure Laterality Date   CARDIAC CATHETERIZATION  06/15/2002   LAD with prox 40% stenosis, norma L main, Cfx with 25% lesion, RCA with long mid 25% stenosis (Dr. Daiva Nakayama)   CARDIAC  CATHETERIZATION  04/01/2010   normal L main, LAD wit mild stenosis, L Cfx with 70% in-stent restenosis, RCA with 70% in-stent restenosis, LVEF >60% (Dr. K. [place] Hilty) - cutting ballon arthrectomy to RCA & Cfx (Dr. Langston Reusing)   CARDIAC CATHETERIZATION  08/25/2010   preserved global LV contractility; multivessel CAD, diffuse 90-95% in-stent restenosis in prox placed Cfx stent - cutting balloon arthrectomy in Cfx with multiple dilatations 90-95% to 0% stenosis (Dr. Bishop Limbo)   CARDIAC CATHETERIZATION  01/26/2011   PCI & stenting of aggresive in-stent restenosis within previously stented AV groove Cfx with 3.0x1mm Taxus DES (previous stents were Promus) (Dr. Erlene Quan)   CARDIAC CATHETERIZATION  05/11/2011   preserved LV function, 40% mid LAD stenosis, 30-40% narrowing proximal to stented semgnet of prox Cfx, patent mid RCA stent with smooth 20% narrowing in distal RCA (Dr. Bishop Limbo)   CARDIAC CATHETERIZATION  12/15/2011   PCI & stenting of proximal & mid Cfx with DES - 3.0x3mm in proximal, 3.0x58mm in mid (Dr. Erlene Quan)   CARDIAC CATHETERIZATION N/A 06/10/2015   Procedure: Left Heart Cath and Coronary Angiography;  Surgeon: Dolores Patty, MD;  Location: [facility] INVASIVE CV LAB;  Service: Cardiovascular;  Laterality: N/A;   CARDIAC CATHETERIZATION  10/25/2018   cardiometabolic testing  08/08/2012   good exercise effort, peak VO2 79% predicted with normal VO2 HR curves (mild deconditioning)   COLONOSCOPY  12/2012   diminutive hyperplastic sigmoid poyp so repeat routine 2024   CORONARY BALLOON ANGIOPLASTY N/A 10/25/2018   Procedure: CORONARY BALLOON ANGIOPLASTY;  Surgeon: Corky Crafts, MD;  Location: [facility] INVASIVE CV LAB;  Service: Cardiovascular;  Laterality: N/A;   CORONARY BALLOON ANGIOPLASTY N/A 09/29/2019   Procedure: CORONARY BALLOON ANGIOPLASTY;  Surgeon: Yvonne Kendall, MD;  Location: [facility] INVASIVE CV LAB;  Service: Cardiovascular;  Laterality: N/A;   CORONARY ULTRASOUND/IVUS N/A 09/29/2019    Procedure: Intravascular Ultrasound/IVUS;  Surgeon: Yvonne Kendall, MD;  Location: [facility] INVASIVE CV LAB;  Service: Cardiovascular;  Laterality: N/A;   EXCISIONAL HEMORRHOIDECTOMY  1984   LEFT HEART CATH AND CORONARY ANGIOGRAPHY N/A 10/25/2018   Procedure: LEFT HEART CATH AND CORONARY ANGIOGRAPHY;  Surgeon: Corky Crafts, MD;  Location: [facility] INVASIVE CV LAB;  Service: Cardiovascular;  Laterality: N/A;   LEFT HEART CATH AND CORONARY ANGIOGRAPHY N/A 09/29/2019   Procedure: LEFT HEART CATH AND CORONARY ANGIOGRAPHY;  Surgeon: Yvonne Kendall, MD;  Location: [facility] INVASIVE CV LAB;  Service: Cardiovascular;  Laterality: N/A;   LEFT HEART CATHETERIZATION WITH CORONARY ANGIOGRAM N/A 05/11/2011   Procedure: LEFT HEART CATHETERIZATION WITH CORONARY ANGIOGRAM;  Surgeon: Lennette Bihari, MD;  Location: [facility] CATH LAB;  Service: Cardiovascular;  Laterality: N/A;  Possible percutaneous coronary intervention, possible IVUS   LEFT HEART CATHETERIZATION WITH CORONARY ANGIOGRAM N/A 12/15/2011   Procedure: LEFT HEART CATHETERIZATION WITH CORONARY ANGIOGRAM;  Surgeon: Runell Gess, MD;  Location: [facility] CATH LAB;  Service: Cardiovascular;  Laterality: N/A;   LEFT HEART CATHETERIZATION WITH CORONARY ANGIOGRAM N/A 09/05/2014   Procedure: LEFT HEART CATHETERIZATION WITH CORONARY ANGIOGRAM;  Surgeon: Kathleene Hazel, MD;  Location: [facility] CATH LAB;  Service: Cardiovascular;  Laterality: N/A;   LIPOMA EXCISION     back of the head   NM MYOCAR PERF WALL MOTION  02/2012   lexiscan myoview; mild perfusion defect in mid inferolateral & basal inferolateral region (infarct/scar); EF 52%, abnormal but ow risk scan   PERCUTANEOUS CORONARY STENT INTERVENTION (PCI-S)  09/05/2014   Procedure: PERCUTANEOUS CORONARY STENT INTERVENTION (PCI-S);  Surgeon: Kathleene Hazel, MD;  Location: [facility] CATH LAB;  Service: Cardiovascular;;   TONSILLECTOMY     Patient Active Problem List   Diagnosis Date Noted   Scrotal pain, right 08/26/2023    Epididymitis, right 08/26/2023   Complex renal cyst, right, 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/26/2023   Impingement syndrome of right shoulder 07/01/2023   Pain in right shoulder 06/16/2023   Anxiety disorder, unspecified 02/16/2023   Alcohol use disorder 02/16/2023   Sleep disorder, unspecified 02/16/2023   Alcohol abuse 02/15/2023   Alcohol dependence ([name]) 05/16/2022   Low energy 03/06/2022   Moderate alcohol dependence in early remission ([name]) 02/24/2022   Suicidal ideation 07/07/2021   Generalized anxiety disorder 07/07/2021   Other allergic rhinitis 09/09/2020   Adverse reaction to food, subsequent encounter 09/09/2020   History of bee sting allergy 09/09/2020   Alcohol dependence with alcohol-induced mood disorder ([name]) 03/19/2020   MDD (major depressive disorder), recurrent episode, severe ([name]) 03/18/2020   History of substance abuse ([name]) 01/04/2020   Severe 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 features ([name]) 12/10/2019   Tinea cruris 07/06/2019   Hemoglobin A1c less than 7.0% 07/06/2019   Muscle strain of  wrist 04/05/2019   Pain in both wrists 04/05/2019   Anxiety 04/05/2019   Prediabetes 04/05/2019   MDD (major depressive disorder) 01/12/2019   Chest pain 10/25/2018   MDD (major depressive disorder), recurrent severe, without psychosis ([name]) 08/15/2018   Influenza A 07/17/2017   Lactic acidosis    Cough    Nausea and vomiting    Severe episode of recurrent major depressive disorder, without psychotic features ([name])    MDD (major depressive disorder), single episode, severe with psychotic features ([name]) 06/27/2017   MDD (major depressive disorder), severe ([name]) 06/23/2017   Essential hypertension 01/13/2016   Unstable angina ([name])    ED (erectile dysfunction) 02/15/2014   Atypical chest pain 12/14/2011   Allergy to contrast media (used for diagnostic x-rays) 05/12/2011   Dyslipidemia, goal LDL below 70 05/09/2011   DM2 (diabetes mellitus, type 2) ([name]) 07/10/2010   CAD S/P  percutaneous coronary angioplasty 07/10/2010    PCP: Angus Seller NP   REFERRING PROVIDER: Persons, Mary PA   REFERRING DIAG: Rt shoulder pain , impingement   THERAPY DIAG:  Acute pain of right shoulder  Rationale for Evaluation and Treatment: Rehabilitation  ONSET DATE: 2-3 mos   SUBJECTIVE:                                                                                                                                                                                      SUBJECTIVE STATEMENT:  Pt reports continued pain and weakness on Rt side.  Pain is positioning with lifting trash, sleeping on that R side.  He has missed a few weeks due to other health issues.  Doing exercises every other day up until the groin pain started.   Eval: Patient presents with right-sided shoulder pain of less than 3 months duration.  He reports pain initially started as he was seated and working on his computer reaching across his body multiple times.  The pain wakes him up at night.  He reports it is hard to lift and rack his bike, complete ADLs bathing dressing and hygiene without increased shoulder pain.  He has not had physical therapy for the shoulder before and has never had surgery.  The right arm feels weak functionally but he does not drop items or have any weakness in his grip.  His pain does not radiate.  Recently had an injection posterior right shoulder but he reports no improvement or change. Hand dominance: Right  PERTINENT HISTORY: MI, multiple cardiac catheterizations  PAIN:  Are you having pain? Yes: NPRS scale: none at rest, can still be severe with lifting  Pain location: Rt  lateral shoulder  Pain description: sharp, achy  Aggravating factors: using it  Relieving factors: rest, ice pack/hot shower   PRECAUTIONS: None  RED FLAGS: None   WEIGHT BEARING RESTRICTIONS: No  FALLS:  Has patient fallen in last 6 months? 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  DIAGNOSTIC FINDINGS:  None  PATIENT SURVEYS:  FOTO 53%  COGNITION: Overall cognitive status: Within functional limits for tasks assessed     SENSATION: WFL  POSTURE: Good sitting posture without cues   UPPER EXTREMITY MMT   MMT Right eval Left eval Rt.  09/09/23  Shoulder flexion 4+ pain  5 4+ pain  Shoulder extension     Shoulder abduction 4+ pain  5 4+ pain   Shoulder adduction     Shoulder internal rotation 5   4+ pain  Shoulder external rotation 5 pain   4+ pain   Elbow flexion     Elbow extension     Wrist flexion     Wrist extension     Wrist ulnar deviation     Wrist radial deviation     Wrist pronation     Wrist supination     (Blank rows = not tested)  UPPER EXTREMITY ROM   AROM  Right eval Left eval Rt. 09/09/23  Shoulder flexion 165  143  Shoulder extension 160    Shoulder abduction   125  Shoulder adduction     Shoulder internal rotation Functional reach toT 7  L1  Shoulder external rotation Functional reach to T3, full in supine with arm at 90 deg abd  T2  Grip strength (lbs) 83 pounds 79 pounds   (Blank rows = not tested)  SHOULDER SPECIAL TESTS: Impingement tests: Neer impingement test: positive  Pain with cross body adduction  JOINT MOBILITY TESTING:  Generalized joint stiffness in all directions of right glenohumeral motion   PALPATION:  Min TTP to the right anterolateral aspect of shoulder, no crepitus                                                                                                                              TREATMENT DATE:   [facility] Adult PT Treatment:                                                DATE: 09/09/23 Therapeutic Activity: UE Nustep L4 for 5 min  Supine dowel exercises: AAROM flexion wide x 10 , cross body add/abd  Supine  blue band ER x 10 Horizontal pull blue band x 10  Sidelying blue band x 15 flexion and scaption  Open book x 5 each side  Sidelying scaption blue band  x 10 each side      [facility] Adult PT Treatment:                                                DATE: 08/12/23 Therapeutic Activity: UBE level 1 for 5 min in reverse Row and extension x 15 Black band  Horizontal abduction green band x 10  Goal post ER green band x 10 each UE  Wall scapular protraction/retraction x green band and wall push up Modified push up green band on upper arm on high mat table  Sidelying 3-5 lbs DB for ER, scaption and flexion Rt UE x 8-12  Thoracic rotation  x 5 , 10 sec in quadruped Quadruped 5 lbs Rt UE extension and triceps  Standing functional core/lifting with dowel and free motion  1 plate diagonal lift: pain on Rt side, able to do x 5 with dowel 1 plate for double arm diagonal lift/chop  Modalities: MHP per pt request 10 min Rt UE supine     [facility] Adult PT Treatment:                                                DATE: 08/05/23 Therapeutic Activity: UBE for warm up L1 6 min bilateral horizontal abduction (GTB)  x 15  external rotation Green Theraband (GTB) x 15  R scaption in side-lying GTB x 15  Rt flexion GTB x 15  Corner stretch x 30 sec x 3 various positions Wall semi circles green band 1 min alternating  Row black band x 15 Extension black band  x 10  Narrow loop forward flexion  x 10 x 2 at the wall [place] Si)  Open book x 5 each side   Self Care: Ways to rack his bike without crossing Rt arm over the midline - brief during UBE    07/15/2023 Patient performed program prescribed: Green Thera-Band bilateral horizontal abduction, external rotation and right-sided scaption and  side-lying  PATIENT EDUCATION: Education details: Plan of care, impingement versus tendinitis, posture rotator cuff and scapula model, home program Person educated: Patient Education method: Explanation, Demonstration, Verbal cues, and Handouts Education comprehension: verbalized understanding and needs further education  HOME EXERCISE PROGRAM: Access Code: [email or web address] URL: [email or web address] Date: 07/15/2023 Prepared by: Karie Mainland  Exercises - Shoulder External Rotation and Scapular Retraction with Resistance  - 1-2 x daily - 7 x weekly - 2 sets - 10 reps - 5 hold - Sidelying Shoulder Abduction with Resistance to 60 Degrees  - 1-2 x daily - 7 x weekly - 2 sets - 10 reps - 5 hold - Standing Shoulder Horizontal Abduction with Resistance  - 1-2 x daily - 7 x weekly - 2 sets - 10 reps - 5 hold -Open book and corner - AAROM    ASSESSMENT:  CLINICAL IMPRESSION: Patient was renewed today for more visits.  He has completed only 4 visits due to other health issues. He continues to have min to no pain at rest.  Pain can be sharp and severe when lifting  heavy items.  AROM is actually a bit less than when he began. Refocused on shoulder mobility and fully reviewed his HEP.  He will continue to benefit from skilled PT to restore pain free motion and strength.    OBJECTIVE IMPAIRMENTS: decreased mobility, decreased ROM, decreased strength, increased fascial restrictions, impaired flexibility, impaired UE functional use, and pain.   ACTIVITY LIMITATIONS: carrying, lifting, sleeping, reach over head, and hygiene/grooming  PARTICIPATION LIMITATIONS: interpersonal relationship and community activity  PERSONAL FACTORS: Past/current experiences and 1 comorbidity: Cardiac issues  are also affecting patient's functional outcome.   REHAB POTENTIAL: Excellent  CLINICAL DECISION MAKING: Unstable/unpredictable  EVALUATION COMPLEXITY: Low   GOALS: Goals reviewed with patient?  Yes  LONG TERM GOALS: Target date: 10/21/2023      Patient will be independent with home exercise program for right upper extremity Baseline: cont.  To work on this  Goal status: ongoing   2.  Patient will increase right shoulder strength to 5 out of 5 in all planes of motion without increased pain Baseline: 4+/5 with pain  Goal status: ongoing   3.  Patient will be able to rack his bike and city bus without increased pain 75% of the time Baseline:  Goal status: MET   4.  FOTO score will improve to 59% in order to demonstrate improved functional mobility Baseline: 53% Update: 54%  Goal status: ongoing    5. Pt will be able to demo shoulder AROM without pain in all directions    Baseline:see above    Goal status: NEW  PLAN:  PT FREQUENCY: 1x/week  PT DURATION: 6 weeks  PLANNED INTERVENTIONS: 97164- PT Re-evaluation, 97110-Therapeutic exercises, 97530- Therapeutic activity, 97112- Neuromuscular re-education, 97535- Self Care, 16109- Manual therapy, Taping, Dry Needling, Joint mobilization, Cryotherapy, and Moist heat  PLAN FOR NEXT SESSION: posture, general spine and shoulder mobility, review lifting technique f   Floyed Masoud, PT 09/09/2023, 8:38 AM   Karie Mainland, PT 09/09/23 8:38 AM Phone: [phone] Fax: [phone]

## 2023-09-22 ENCOUNTER — Ambulatory Visit: Payer: MEDICAID | Admitting: Physical Therapy

## 2023-09-22 ENCOUNTER — Encounter: Payer: Self-pay | Admitting: Physical Therapy

## 2023-09-22 DIAGNOSIS — M25511 Pain in right shoulder: Secondary | ICD-10-CM

## 2023-09-22 NOTE — Therapy (Signed)
 OUTPATIENT PHYSICAL THERAPY SHOULDER TREATMENT    Patient Name: [name] MRN: [id number] DOB:[birth year], 63 y.o., male Today's Date: 09/22/2023  END OF SESSION:  PT End of Session - 09/22/23 0757     Visit Number 5    Number of Visits 10    Date for PT Re-Evaluation 10/21/23    Authorization Type [employer]    Authorization Time Period 1/20-3/8 , 6 visits. Auth requested 09/09/23    PT Start Time 0800    PT Stop Time 0845    PT Time Calculation (min) 45 min              Past Medical History:  Diagnosis Date   Alcohol dependence ([name])    Allergies    Arthritis    Chest pain    Chronic lower back pain    Chronic pain of right wrist    Cocaine abuse ([name])    per note on 01/18/23   Coronary artery disease    a. Multiple prior caths/PCI. Cath 2013 with possible spasm of RCA, 70% ISR of mid LCx with subsequent DES to mLCx and prox LCX. b. H/o microvascular angina. c. Recurrent angina 08/2014 - s/p PTCA/DES to prox Cx, PTCA/CBA to OM1.  c. LHC 06/10/15 with patent stents and some ISR in LCX and OM-1 that was not flow limiting --> Rx    Dyslipidemia    a. Intolerant to many statins except tolerating Livalo.   GERD (gastroesophageal reflux disease)    H/O cardiac catheterization 10/25/2018   Heart attack [facility])    Hypertension    Myocardial infarction [facility]) ~ 2010   Renal mass    S/P angioplasty with stent, DES, to proximal and mid LCX 12/15/11 12/15/2011   S/P foot surgery, right 04/2021   Shoulder pain    Stroke ([name])    pt. reports had a stroke around time of MI 2010   Type II diabetes mellitus ([name])    Unstable angina [facility])    Past Surgical History:  Procedure Laterality Date   CARDIAC CATHETERIZATION  06/15/2002   LAD with prox 40% stenosis, norma L main, Cfx with 25% lesion, RCA with long mid 25% stenosis (Dr. Daiva Nakayama)   CARDIAC CATHETERIZATION  04/01/2010   normal L main, LAD wit mild stenosis, L Cfx with 70% in-stent restenosis, RCA with 70%  in-stent restenosis, LVEF >60% (Dr. K. Italy Hilty) - cutting ballon arthrectomy to RCA & Cfx (Dr. Langston Reusing)   CARDIAC CATHETERIZATION  08/25/2010   preserved global LV contractility; multivessel CAD, diffuse 90-95% in-stent restenosis in prox placed Cfx stent - cutting balloon arthrectomy in Cfx with multiple dilatations 90-95% to 0% stenosis (Dr. Bishop Limbo)   CARDIAC CATHETERIZATION  01/26/2011   PCI & stenting of aggresive in-stent restenosis within previously stented AV groove Cfx with 3.0x50mm Taxus DES (previous stents were Promus) (Dr. Erlene Quan)   CARDIAC CATHETERIZATION  05/11/2011   preserved LV function, 40% mid LAD stenosis, 30-40% narrowing proximal to stented semgnet of prox Cfx, patent mid RCA stent with smooth 20% narrowing in distal RCA (Dr. Bishop Limbo)   CARDIAC CATHETERIZATION  12/15/2011   PCI & stenting of proximal & mid Cfx with DES - 3.0x2mm in proximal, 3.0x46mm in mid (Dr. Erlene Quan)   CARDIAC CATHETERIZATION N/A 06/10/2015   Procedure: Left Heart Cath and Coronary Angiography;  Surgeon: Dolores Patty, MD;  Location: [facility] INVASIVE CV LAB;  Service: Cardiovascular;  Laterality: N/A;   CARDIAC CATHETERIZATION  10/25/2018  cardiometabolic testing  08/08/2012   good exercise effort, peak VO2 79% predicted with normal VO2 HR curves (mild deconditioning)   COLONOSCOPY  12/2012   diminutive hyperplastic sigmoid poyp so repeat routine 2024   CORONARY BALLOON ANGIOPLASTY N/A 10/25/2018   Procedure: CORONARY BALLOON ANGIOPLASTY;  Surgeon: Corky Crafts, MD;  Location: [facility] INVASIVE CV LAB;  Service: Cardiovascular;  Laterality: N/A;   CORONARY BALLOON ANGIOPLASTY N/A 09/29/2019   Procedure: CORONARY BALLOON ANGIOPLASTY;  Surgeon: Yvonne Kendall, MD;  Location: [facility] INVASIVE CV LAB;  Service: Cardiovascular;  Laterality: N/A;   CORONARY ULTRASOUND/IVUS N/A 09/29/2019   Procedure: Intravascular Ultrasound/IVUS;  Surgeon: Yvonne Kendall, MD;  Location: [facility] INVASIVE CV LAB;  Service:  Cardiovascular;  Laterality: N/A;   EXCISIONAL HEMORRHOIDECTOMY  1984   LEFT HEART CATH AND CORONARY ANGIOGRAPHY N/A 10/25/2018   Procedure: LEFT HEART CATH AND CORONARY ANGIOGRAPHY;  Surgeon: Corky Crafts, MD;  Location: [facility] INVASIVE CV LAB;  Service: Cardiovascular;  Laterality: N/A;   LEFT HEART CATH AND CORONARY ANGIOGRAPHY N/A 09/29/2019   Procedure: LEFT HEART CATH AND CORONARY ANGIOGRAPHY;  Surgeon: Yvonne Kendall, MD;  Location: [facility] INVASIVE CV LAB;  Service: Cardiovascular;  Laterality: N/A;   LEFT HEART CATHETERIZATION WITH CORONARY ANGIOGRAM N/A 05/11/2011   Procedure: LEFT HEART CATHETERIZATION WITH CORONARY ANGIOGRAM;  Surgeon: Lennette Bihari, MD;  Location: [facility] CATH LAB;  Service: Cardiovascular;  Laterality: N/A;  Possible percutaneous coronary intervention, possible IVUS   LEFT HEART CATHETERIZATION WITH CORONARY ANGIOGRAM N/A 12/15/2011   Procedure: LEFT HEART CATHETERIZATION WITH CORONARY ANGIOGRAM;  Surgeon: Runell Gess, MD;  Location: [facility] CATH LAB;  Service: Cardiovascular;  Laterality: N/A;   LEFT HEART CATHETERIZATION WITH CORONARY ANGIOGRAM N/A 09/05/2014   Procedure: LEFT HEART CATHETERIZATION WITH CORONARY ANGIOGRAM;  Surgeon: Kathleene Hazel, MD;  Location: [facility] CATH LAB;  Service: Cardiovascular;  Laterality: N/A;   LIPOMA EXCISION     back of the head   NM MYOCAR PERF WALL MOTION  02/2012   lexiscan myoview; mild perfusion defect in mid inferolateral & basal inferolateral region (infarct/scar); EF 52%, abnormal but ow risk scan   PERCUTANEOUS CORONARY STENT INTERVENTION (PCI-S)  09/05/2014   Procedure: PERCUTANEOUS CORONARY STENT INTERVENTION (PCI-S);  Surgeon: Kathleene Hazel, MD;  Location: [facility] CATH LAB;  Service: Cardiovascular;;   TONSILLECTOMY     Patient Active Problem List   Diagnosis Date Noted   Scrotal pain, right 08/26/2023   Epididymitis, right 08/26/2023   Complex renal cyst, right, 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/26/2023   Impingement syndrome of right  shoulder 07/01/2023   Pain in right shoulder 06/16/2023   Anxiety disorder, unspecified 02/16/2023   Alcohol use disorder 02/16/2023   Sleep disorder, unspecified 02/16/2023   Alcohol abuse 02/15/2023   Alcohol dependence ([name]) 05/16/2022   Low energy 03/06/2022   Moderate alcohol dependence in early remission ([name]) 02/24/2022   Suicidal ideation 07/07/2021   Generalized anxiety disorder 07/07/2021   Other allergic rhinitis 09/09/2020   Adverse reaction to food, subsequent encounter 09/09/2020   History of bee sting allergy 09/09/2020   Alcohol dependence with alcohol-induced mood disorder ([name]) 03/19/2020   MDD (major depressive disorder), recurrent episode, severe ([name]) 03/18/2020   History of substance abuse ([name]) 01/04/2020   Severe 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 features ([name]) 12/10/2019   Tinea cruris 07/06/2019   Hemoglobin A1c less than 7.0% 07/06/2019   Muscle strain of wrist 04/05/2019   Pain in both wrists 04/05/2019   Anxiety 04/05/2019   Prediabetes 04/05/2019  MDD (major depressive disorder) 01/12/2019   Chest pain 10/25/2018   MDD (major depressive disorder), recurrent severe, without psychosis ([name]) 08/15/2018   Influenza A 07/17/2017   Lactic acidosis    Cough    Nausea and vomiting    Severe episode of recurrent major depressive disorder, without psychotic features ([name])    MDD (major depressive disorder), single episode, severe with psychotic features ([name]) 06/27/2017   MDD (major depressive disorder), severe ([name]) 06/23/2017   Essential hypertension 01/13/2016   Unstable angina ([name])    ED (erectile dysfunction) 02/15/2014   Atypical chest pain 12/14/2011   Allergy to contrast media (used for diagnostic x-rays) 05/12/2011   Dyslipidemia, goal LDL below 70 05/09/2011   DM2 (diabetes mellitus, type 2) ([name]) 07/10/2010   CAD S/P percutaneous coronary angioplasty 07/10/2010    PCP: Angus Seller NP   REFERRING PROVIDER: Persons, Mary PA    REFERRING DIAG: Rt shoulder pain , impingement   THERAPY DIAG:  Acute pain of right shoulder  Rationale for Evaluation and Treatment: Rehabilitation  ONSET DATE: 2-3 mos   SUBJECTIVE:                                                                                                                                                                                      SUBJECTIVE STATEMENT: Pt arrives with 5/10 pain , reports soreness from HEP.   Re-eval: Pt reports continued pain and weakness on Rt side.  Pain is positioning with lifting trash, sleeping on that R side.  He has missed a few weeks due to other health issues.  Doing exercises every other day up until the groin pain started.   Eval: Patient presents with right-sided shoulder pain of less than 3 months duration.  He reports pain initially started as he was seated and working on his computer reaching across his body multiple times.  The pain wakes him up at night.  He reports it is hard to lift and rack his bike, complete ADLs bathing dressing and hygiene without increased shoulder pain.  He has not had physical therapy for the shoulder before and has never had surgery.  The right arm feels weak functionally but he does not drop items or have any weakness in his grip.  His pain does not radiate.  Recently had an injection posterior right shoulder but he reports no improvement or change. Hand dominance: Right  PERTINENT HISTORY: MI, multiple cardiac catheterizations  PAIN:  Are you having pain? Yes: NPRS scale: none at rest, can still be severe with lifting  Pain location: Rt  lateral shoulder  Pain description: sharp, achy  Aggravating factors: using it  Relieving factors:  rest, ice pack/hot shower   PRECAUTIONS: None  RED FLAGS: None   WEIGHT BEARING RESTRICTIONS: No  FALLS:  Has patient fallen in last 6 months? 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  DIAGNOSTIC FINDINGS:  None  PATIENT SURVEYS:  FOTO 53%  COGNITION: Overall cognitive status: Within functional limits for tasks assessed     SENSATION: WFL  POSTURE: Good sitting posture without cues   UPPER EXTREMITY MMT   MMT Right eval Left eval Rt.  09/09/23  Shoulder flexion 4+ pain  5 4+ pain  Shoulder extension     Shoulder abduction 4+ pain  5 4+ pain   Shoulder adduction     Shoulder internal rotation 5   4+ pain  Shoulder external rotation 5 pain   4+ pain   Elbow flexion     Elbow extension     Wrist flexion     Wrist extension     Wrist ulnar deviation     Wrist radial deviation     Wrist pronation     Wrist supination     (Blank rows = not tested)  UPPER EXTREMITY ROM   AROM  Right eval Left eval Rt. 09/09/23 Rt 09/22/23  Shoulder flexion 165  143 150  Shoulder extension 160     Shoulder abduction   125 110  Shoulder adduction      Shoulder internal rotation Functional reach toT 7  L1   Shoulder external rotation Functional reach to T3, full in supine with arm at 90 deg abd  T2   Grip strength (lbs) 83 pounds 79 pounds    (Blank rows = not tested)  SHOULDER SPECIAL TESTS: Impingement tests: Neer impingement test: positive  Pain with cross body adduction  JOINT MOBILITY TESTING:  Generalized joint stiffness in all directions of right glenohumeral motion   PALPATION:  Min TTP to the right anterolateral aspect of shoulder, no crepitus                                                                                                                              TREATMENT DATE:  [facility] Adult PT  Treatment:                                                DATE: 09/22/23 Therapeutic Exercise: Supine chest pres  Supine pullover  Supine protraction  Supine horiz abdct /addct with dowel Supine ER blue  band 10 x 2  Supine horiz abdct Blue 10 x 2  Supine right Diagonal GTB, Red TB Side lying shoulder abduction, flexion, horizontal abduction Right shoulder PROM Updated HEP Manual Therapy: Right shoulder distraction, inferior and A/P glides    [facility] Adult PT Treatment:                                                DATE: 09/09/23 Therapeutic Activity: UE Nustep L4 for 5 min  Supine dowel exercises: AAROM flexion wide x 10 , cross body add/abd  Supine blue band ER x 10 Horizontal pull blue band x 10  Sidelying blue band x 15 flexion and scaption  Open book x 5 each side  Sidelying scaption blue band  x 10 each side      [facility] Adult PT Treatment:                                                DATE: 08/12/23 Therapeutic Activity: UBE level 1 for 5 min in reverse Row and extension x 15 Black band  Horizontal abduction green band x 10  Goal post ER green band x 10 each UE  Wall scapular protraction/retraction x green band and wall push up Modified push up green band on upper arm on high mat table  Sidelying 3-5 lbs DB for ER, scaption and flexion Rt UE x 8-12  Thoracic rotation  x 5 , 10 sec in quadruped Quadruped 5 lbs Rt UE extension and triceps  Standing functional core/lifting with dowel and free motion  1 plate diagonal lift: pain on Rt side, able to do x 5 with dowel 1 plate for double arm diagonal lift/chop  Modalities: MHP per pt request 10 min Rt UE supine     [facility] Adult PT Treatment:                                                DATE: 08/05/23 Therapeutic Activity: UBE for warm up L1 6 min bilateral horizontal abduction (GTB)  x 15  external rotation Green Theraband (GTB) x 15  R scaption in side-lying GTB x 15  Rt flexion GTB x 15  Corner stretch x 30 sec x 3  various positions Wall semi circles green band 1 min alternating  Row black band x 15 Extension black band  x 10  Narrow loop forward flexion  x 10 x 2 at the wall [place] Si)  Open book x 5 each side   Self Care: Ways to rack his bike without crossing Rt arm over the midline - brief during UBE    07/15/2023 Patient performed program prescribed: Green Thera-Band bilateral horizontal abduction, external rotation and right-sided scaption and side-lying  PATIENT EDUCATION: Education details: Plan of care, impingement versus tendinitis, posture rotator cuff and scapula model, home program Person educated: Patient Education method: Explanation, Demonstration, Verbal cues, and Handouts Education comprehension: verbalized understanding and needs further education  HOME EXERCISE PROGRAM: Access Code: [email or web address] URL: [email or web address] Date: 07/15/2023 Prepared by: Karie Mainland  Exercises - Shoulder External Rotation and Scapular Retraction with Resistance  -  1-2 x daily - 7 x weekly - 2 sets - 10 reps - 5 hold - Sidelying Shoulder Abduction with Resistance to 60 Degrees  - 1-2 x daily - 7 x weekly - 2 sets - 10 reps - 5 hold - Standing Shoulder Horizontal Abduction with Resistance  - 1-2 x daily - 7 x weekly - 2 sets - 10 reps - 5 hold -Open book and corner - AAROM   09/22/23 - Supine PNF D2 Flexion with Resistance  - 1 x daily - 7 x weekly - 2 sets - 10 reps  ASSESSMENT:  CLINICAL IMPRESSION: Pt reports compliance with HEP and soreness afterward. His shoulder abduction is more limited today and painful. Manual provided for shoulder mobilization, distraction and PROM. Reviewed Supine HEP and progressed with D2 flexion and updated HEP. Cues given during strengthening to maintain resistance throughout ROM. Otherwise pt completed all exercises without increased pain, only muscle fatigue.   Re-eval: Patient was renewed today for more visits.  He has completed only 4 visits due to  other health issues. He continues to have min to no pain at rest.  Pain can be sharp and severe when lifting heavy items.  AROM is actually a bit less than when he began. Refocused on shoulder mobility and fully reviewed his HEP.  He will continue to benefit from skilled PT to restore pain free motion and strength.    OBJECTIVE IMPAIRMENTS: decreased mobility, decreased ROM, decreased strength, increased fascial restrictions, impaired flexibility, impaired UE functional use, and pain.   ACTIVITY LIMITATIONS: carrying, lifting, sleeping, reach over head, and hygiene/grooming  PARTICIPATION LIMITATIONS: interpersonal relationship and community activity  PERSONAL FACTORS: Past/current experiences and 1 comorbidity: Cardiac issues  are also affecting patient's functional outcome.   REHAB POTENTIAL: Excellent  CLINICAL DECISION MAKING: Unstable/unpredictable  EVALUATION COMPLEXITY: Low   GOALS: Goals reviewed with patient? Yes  LONG TERM GOALS: Target date: 10/21/2023      Patient will be independent with home exercise program for right upper extremity Baseline: cont.  To work on this  Goal status: ongoing   2.  Patient will increase right shoulder strength to 5 out of 5 in all planes of motion without increased pain Baseline: 4+/5 with pain  Goal status: ongoing   3.  Patient will be able to rack his bike and city bus without increased pain 75% of the time Baseline:  Goal status: MET   4.  FOTO score will improve to 59% in order to demonstrate improved functional mobility Baseline: 53% Update: 54%  Goal status: ongoing    5. Pt will be able to demo shoulder AROM without pain in all directions    Baseline:see above    Goal status: NEW  PLAN:  PT FREQUENCY: 1x/week  PT DURATION: 6 weeks  PLANNED INTERVENTIONS: 97164- PT Re-evaluation, 97110-Therapeutic exercises, 97530- Therapeutic activity, 97112- Neuromuscular re-education, 97535- Self Care, 16109- Manual therapy,  Taping, Dry Needling, Joint mobilization, Cryotherapy, and Moist heat  PLAN FOR NEXT SESSION: posture, general spine and shoulder mobility, review lifting technique f   Jannette Spanner, PTA 09/22/23 9:11 AM Phone: [phone] Fax: [phone]

## 2023-09-23 ENCOUNTER — Encounter: Payer: Self-pay | Admitting: Urology

## 2023-09-23 ENCOUNTER — Ambulatory Visit (INDEPENDENT_AMBULATORY_CARE_PROVIDER_SITE_OTHER): Payer: MEDICAID | Admitting: Urology

## 2023-09-23 VITALS — BP 127/87 | HR 60 | Ht 68.0 in | Wt 195.0 lb

## 2023-09-23 DIAGNOSIS — G8929 Other chronic pain: Secondary | ICD-10-CM

## 2023-09-23 DIAGNOSIS — N5082 Scrotal pain: Secondary | ICD-10-CM

## 2023-09-23 DIAGNOSIS — N281 Cyst of kidney, acquired: Secondary | ICD-10-CM | POA: Diagnosis not present

## 2023-09-23 DIAGNOSIS — R1031 Right lower quadrant pain: Secondary | ICD-10-CM | POA: Diagnosis not present

## 2023-09-23 LAB — MICROSCOPIC EXAMINATION

## 2023-09-23 LAB — URINALYSIS, ROUTINE W REFLEX MICROSCOPIC
Bilirubin, UA: NEGATIVE
Glucose, UA: NEGATIVE
Ketones, UA: NEGATIVE
Leukocytes,UA: NEGATIVE
Nitrite, UA: NEGATIVE
RBC, UA: NEGATIVE
Specific Gravity, UA: 1.025 (ref 1.005–1.030)
Urobilinogen, Ur: 0.2 mg/dL (ref 0.2–1.0)
pH, UA: 5.5 (ref 5.0–7.5)

## 2023-09-23 MED ORDER — MELOXICAM 15 MG PO TABS: 15.0000 mg | ORAL_TABLET | Freq: Every day | ORAL | 1 refills | Status: DC

## 2023-09-23 NOTE — Progress Notes (Signed)
 Assessment: 1. Groin pain, chronic, right   2. Scrotal pain, right   3. Complex renal cyst, right, Bosniak IIF     Plan: I personally reviewed the MRI findings from 09/03/2023 with results as noted below. I discussed the findings and diagnosis of a Bosniak 79F cyst with the patient today.  Recommendations for continued surveillance discussed. Recommend a repeat MRI in 6 months.  If this remains stable, he will not need additional evaluation for at least 1 year. I again reviewed his prior scrotal ultrasound results.  I do not see any evidence of a abnormality that would explain his symptoms.  I discussed the possible causes of his chronic right groin and right scrotal pain including chronic epididymitis, inguinal ligament inflammation, and pelvic floor dysfunction.  I also discussed possible neuropathic causes for his symptoms.  Since he did not see any improvement with the antibiotics I would not recommend additional courses at this time. Recommend a prolonged course of meloxicam 15 mg daily.  Prescription sent. I also discussed additional medical therapy with TCAs and neuromodulating drugs. I discussed the potential benefit of pelvic floor physical therapy as well. He will contact me with an update on his symptoms in approximately 30 days.  Chief Complaint:  Chief Complaint  Patient presents with   Follow-up    History of Present Illness:  [name] is a 63 y.o. male who is seen for further evaluation of right scrotal and groin pain and right renal mass.  Scrotal Pain: He was seen at urgent care in November 2024 with pain in the right testicle and right groin. Scrotal ultrasound from 05/18/2023 demonstrated mild heterogeneity of the testicles without discrete mass, bilateral epididymal cysts.  He was treated with antibiotics and his symptoms improved.  He noted a return of his symptoms for approximately 1 month and was seen in the emergency room on 08/13/2023 for evaluation.    Scrotal ultrasound from 08/13/2023 demonstrated normal blood flow bilaterally with normal-appearing testes, bilateral epididymal cyst, mild right varicocele. He was again treated with Levaquin x 10 days.  He did not notice a significant change in his symptoms.  He continued with fairly constant discomfort in the right scrotum with radiation into the right groin area.  No scrotal swelling or redness.  No history of scrotal trauma.  His symptoms are worsened with certain movement and position. He reported some lower urinary tract symptoms including frequency, weak stream, incomplete emptying.  No dysuria or gross hematuria. He was previously seen by Dr. Berneice Heinrich in August 2015 for right sided scrotal pain.  Scrotal ultrasound at that time demonstrated epididymal cysts cysts and small bilateral varicoceles.  During his evaluation in 2/25, his exam was consistent with right epididymitis.  He was treated with doxycycline x 2 weeks as well as a anti-inflammatory medication.  He reports no change in his symptoms following the course of antibiotics and anti-inflammatory medication.  He continues to have discomfort in the right scrotal area and right groin area.  He rates his pain as a 4-6/10.  He notices increased pain with palpation of the right scrotum and with movement.  Specifically, he reports increased discomfort with bending forward.  No scrotal swelling or redness. No change in urinary symptoms.  IPSS = 5 today.  Renal Mass: He underwent evaluation with a CT scan in May 2024 for abdominal pain.This showed an indeterminate lesion in the right kidney measuring 13 mm in size.  He was subsequently evaluated with an MRI in September  2024 which showed a 1.3 x 1.0 cm complex lesion in the right kidney with hyper intense fluid signal and enhancing septations consistent with a Bosniak 37F cyst.  Further imaging at 6 months recommended. He underwent further evaluation with a MRI of the abdomen with and without  contrast on 09/03/2023.  This showed a stable complex lesion on the anterior midportion of the right kidney measuring 1.3 x 1.0 cm with thin internal septations consistent with a Bosniak 37F cyst. No flank pain or gross hematuria.  Portions of the above documentation were copied from a prior visit for review purposes only.   Past Medical History:  Past Medical History:  Diagnosis Date   Alcohol dependence ([name])    Allergies    Arthritis    Chest pain    Chronic lower back pain    Chronic pain of right wrist    Cocaine abuse ([name])    per note on 01/18/23   Coronary artery disease    a. Multiple prior caths/PCI. Cath 2013 with possible spasm of RCA, 70% ISR of mid LCx with subsequent DES to mLCx and prox LCX. b. H/o microvascular angina. c. Recurrent angina 08/2014 - s/p PTCA/DES to prox Cx, PTCA/CBA to OM1.  c. LHC 06/10/15 with patent stents and some ISR in LCX and OM-1 that was not flow limiting --> Rx    Dyslipidemia    a. Intolerant to many statins except tolerating Livalo.   GERD (gastroesophageal reflux disease)    H/O cardiac catheterization 10/25/2018   Heart attack [facility])    Hypertension    Myocardial infarction [facility]) ~ 2010   Renal mass    S/P angioplasty with stent, DES, to proximal and mid LCX 12/15/11 12/15/2011   S/P foot surgery, right 04/2021   Shoulder pain    Stroke ([name])    pt. reports had a stroke around time of MI 2010   Type II diabetes mellitus ([name])    Unstable angina ([name])     Past Surgical History:  Past Surgical History:  Procedure Laterality Date   CARDIAC CATHETERIZATION  06/15/2002   LAD with prox 40% stenosis, norma L main, Cfx with 25% lesion, RCA with long mid 25% stenosis (Dr. Daiva Nakayama)   CARDIAC CATHETERIZATION  04/01/2010   normal L main, LAD wit mild stenosis, L Cfx with 70% in-stent restenosis, RCA with 70% in-stent restenosis, LVEF >60% (Dr. K. Italy Hilty) - cutting ballon arthrectomy to RCA & Cfx (Dr. Langston Reusing)   CARDIAC CATHETERIZATION   08/25/2010   preserved global LV contractility; multivessel CAD, diffuse 90-95% in-stent restenosis in prox placed Cfx stent - cutting balloon arthrectomy in Cfx with multiple dilatations 90-95% to 0% stenosis (Dr. Bishop Limbo)   CARDIAC CATHETERIZATION  01/26/2011   PCI & stenting of aggresive in-stent restenosis within previously stented AV groove Cfx with 3.0x42mm Taxus DES (previous stents were Promus) (Dr. Erlene Quan)   CARDIAC CATHETERIZATION  05/11/2011   preserved LV function, 40% mid LAD stenosis, 30-40% narrowing proximal to stented semgnet of prox Cfx, patent mid RCA stent with smooth 20% narrowing in distal RCA (Dr. Bishop Limbo)   CARDIAC CATHETERIZATION  12/15/2011   PCI & stenting of proximal & mid Cfx with DES - 3.0x5mm in proximal, 3.0x31mm in mid (Dr. Erlene Quan)   CARDIAC CATHETERIZATION N/A 06/10/2015   Procedure: Left Heart Cath and Coronary Angiography;  Surgeon: Dolores Patty, MD;  Location: [facility] INVASIVE CV LAB;  Service: Cardiovascular;  Laterality: N/A;   CARDIAC CATHETERIZATION  10/25/2018   cardiometabolic testing  08/08/2012   good exercise effort, peak VO2 79% predicted with normal VO2 HR curves (mild deconditioning)   COLONOSCOPY  12/2012   diminutive hyperplastic sigmoid poyp so repeat routine 2024   CORONARY BALLOON ANGIOPLASTY N/A 10/25/2018   Procedure: CORONARY BALLOON ANGIOPLASTY;  Surgeon: Corky Crafts, MD;  Location: [facility] INVASIVE CV LAB;  Service: Cardiovascular;  Laterality: N/A;   CORONARY BALLOON ANGIOPLASTY N/A 09/29/2019   Procedure: CORONARY BALLOON ANGIOPLASTY;  Surgeon: Yvonne Kendall, MD;  Location: [facility] INVASIVE CV LAB;  Service: Cardiovascular;  Laterality: N/A;   CORONARY ULTRASOUND/IVUS N/A 09/29/2019   Procedure: Intravascular Ultrasound/IVUS;  Surgeon: Yvonne Kendall, MD;  Location: [facility] INVASIVE CV LAB;  Service: Cardiovascular;  Laterality: N/A;   EXCISIONAL HEMORRHOIDECTOMY  1984   LEFT HEART CATH AND CORONARY ANGIOGRAPHY N/A 10/25/2018    Procedure: LEFT HEART CATH AND CORONARY ANGIOGRAPHY;  Surgeon: Corky Crafts, MD;  Location[facility] INVASIVE CV LAB;  Service: Cardiovascular;  Laterality: N/A;   LEFT HEART CATH AND CORONARY ANGIOGRAPHY N/A 09/29/2019   Procedure: LEFT HEART CATH AND CORONARY ANGIOGRAPHY;  Surgeon: Yvonne Kendall, MD;  Location: [facility] INVASIVE CV LAB;  Service: Cardiovascular;  Laterality: N/A;   LEFT HEART CATHETERIZATION WITH CORONARY ANGIOGRAM N/A 05/11/2011   Procedure: LEFT HEART CATHETERIZATION WITH CORONARY ANGIOGRAM;  Surgeon: Lennette Bihari, MD;  Location: [facility] CATH LAB;  Service: Cardiovascular;  Laterality: N/A;  Possible percutaneous coronary intervention, possible IVUS   LEFT HEART CATHETERIZATION WITH CORONARY ANGIOGRAM N/A 12/15/2011   Procedure: LEFT HEART CATHETERIZATION WITH CORONARY ANGIOGRAM;  Surgeon: Runell Gess, MD;  Location: [facility] CATH LAB;  Service: Cardiovascular;  Laterality: N/A;   LEFT HEART CATHETERIZATION WITH CORONARY ANGIOGRAM N/A 09/05/2014   Procedure: LEFT HEART CATHETERIZATION WITH CORONARY ANGIOGRAM;  Surgeon: Kathleene Hazel, MD;  Location: [facility] CATH LAB;  Service: Cardiovascular;  Laterality: N/A;   LIPOMA EXCISION     back of the head   NM MYOCAR PERF WALL MOTION  02/2012   lexiscan myoview; mild perfusion defect in mid inferolateral & basal inferolateral region (infarct/scar); EF 52%, abnormal but ow risk scan   PERCUTANEOUS CORONARY STENT INTERVENTION (PCI-S)  09/05/2014   Procedure: PERCUTANEOUS CORONARY STENT INTERVENTION (PCI-S);  Surgeon: Kathleene Hazel, MD;  Location: [facility] CATH LAB;  Service: Cardiovascular;;   TONSILLECTOMY      Allergies:  Allergies  Allergen Reactions   Bee Venom Anaphylaxis, Hives, Itching and Other (See Comments)    Red eyes   Gadolinium Hives, Itching, Swelling and Other (See Comments)    Swelling of eyes after receiving MR contrast. 13 hr prep recommended. Pt developed large hive and swelling under right eye after contrast, given  50mg  iv benadryl, pt will need premed before gadolinium//lh   Shellfish Allergy Anaphylaxis, Hives, Itching and Other (See Comments)    Red eyes   Statins Other (See Comments)    Myalgias. Tolerating livalo. Pain    Testosterone Cypionate Other (See Comments)    Testerone Injection --Increased breast tissue     Family History:  Family History  Problem Relation Age of Onset   Leukemia Mother    Prostate cancer Father    Cancer Brother    Coronary artery disease Paternal Grandmother    Cancer Paternal Grandfather    Migraines Neg Hx    Headache Neg Hx    Colon cancer Neg Hx    Esophageal cancer Neg Hx    Rectal cancer Neg Hx    Stomach cancer Neg  Hx     Social History:  Social History   Tobacco Use   Smoking status: Former    Current packs/day: 0.00    Average packs/day: 1 pack/day for 10.0 years (10.0 ttl pk-yrs)    Types: Cigarettes    Start date: 10/06/2008    Quit date: 10/07/2018    Years since quitting: 4.9   Smokeless tobacco: Never  Vaping Use   Vaping status: Never Used  Substance Use Topics   Alcohol use: Not Currently    Comment: 05/28/2023-stopped drinking 22 days ago.   Drug use: Not Currently    Types: Cocaine    ROS: Constitutional:  Negative for fever, chills, weight loss CV: Negative for chest pain, previous MI, hypertension Respiratory:  Negative for shortness of breath, wheezing, sleep apnea, frequent cough GI:  Negative for nausea, vomiting, bloody stool, GERD  Physical exam: BP 127/87   Pulse 60   Ht 5\' 8"  (1.727 m)   Wt 195 lb (88.5 kg)   BMI 29.65 kg/m  GENERAL APPEARANCE:  Well appearing, well developed, well nourished, NAD HEENT:  Atraumatic, normocephalic, oropharynx clear NECK:  Supple without lymphadenopathy or thyromegaly ABDOMEN:  Soft, non-tender, no masses EXTREMITIES:  Moves all extremities well, without clubbing, cyanosis, or edema NEUROLOGIC:  Alert and oriented x 3, normal gait, CN II-XII grossly intact MENTAL STATUS:   appropriate BACK:  Non-tender to palpation, No CVAT SKIN:  Warm, dry, and intact GU: Scrotum: No erythema or edema; no obvious varicocele palpated; no hernia palpated on the right; some tenderness to palpation of the inguinal ring Testis: Nontender and without mass bilaterally Epididymis: Minimal tenderness on right   Results: U/A: 0-5 WBC, 0-2 RBC

## 2023-09-30 ENCOUNTER — Ambulatory Visit: Payer: MEDICAID | Admitting: Physical Therapy

## 2023-10-07 ENCOUNTER — Ambulatory Visit: Payer: MEDICAID | Admitting: Physical Therapy

## 2023-10-13 ENCOUNTER — Other Ambulatory Visit: Payer: Self-pay | Admitting: Nurse Practitioner

## 2023-10-14 ENCOUNTER — Ambulatory Visit: Payer: MEDICAID | Admitting: Physical Therapy

## 2023-10-28 ENCOUNTER — Ambulatory Visit: Payer: MEDICAID | Attending: Physician Assistant | Admitting: Physical Therapy

## 2023-10-28 ENCOUNTER — Ambulatory Visit: Payer: MEDICAID | Admitting: Physical Therapy

## 2023-10-28 DIAGNOSIS — M25511 Pain in right shoulder: Secondary | ICD-10-CM | POA: Insufficient documentation

## 2023-10-28 NOTE — Therapy (Signed)
 OUTPATIENT PHYSICAL THERAPY SHOULDER TREATMENT  RENEWAL   Patient Name: [name] MRN: [id number] DOB:Sep 25, 1960, 63 y.o., male Today's Date: 10/28/2023  END OF SESSION:  PT End of Session - 10/28/23 1101     Visit Number 6    Number of Visits 12    Date for PT Re-Evaluation 12/09/23    Authorization Type [employer]    Authorization Time Period re auth submitted 5/1    PT Start Time 1102    PT Stop Time 1145    PT Time Calculation (min) 43 min    Activity Tolerance Patient tolerated treatment well    Behavior During Therapy WFL for tasks assessed/performed              Past Medical History:  Diagnosis Date   Alcohol  dependence ([name])    Allergies    Arthritis    Chest pain    Chronic lower back pain    Chronic pain of right wrist    Cocaine abuse ([name])    per note on 01/18/23   Coronary artery disease    a. Multiple prior caths/PCI. Cath 2013 with possible spasm of RCA, 70% ISR of mid LCx with subsequent DES to mLCx and prox LCX. b. H/o microvascular angina. c. Recurrent angina 08/2014 - s/p PTCA/DES to prox Cx, PTCA/CBA to OM1.  c. LHC 06/10/15 with patent stents and some ISR in LCX and OM-1 that was not flow limiting --> Rx    Dyslipidemia    a. Intolerant to many statins except tolerating Livalo .   GERD (gastroesophageal reflux disease)    H/O cardiac catheterization 10/25/2018   Heart attack [facility])    Hypertension    Myocardial infarction [facility]) ~ 2010   Renal mass    S/P angioplasty with stent, DES, to proximal and mid LCX 12/15/11 12/15/2011   S/P foot surgery, right 04/2021   Shoulder pain    Stroke ([name])    pt. reports had a stroke around time of MI 2010   Type II diabetes mellitus ([name])    Unstable angina ([name])    Past Surgical History:  Procedure Laterality Date   CARDIAC CATHETERIZATION  06/15/2002   LAD with prox 40% stenosis, norma L main, Cfx with 25% lesion, RCA with long mid 25% stenosis (Dr. Arlene Lacy)   CARDIAC CATHETERIZATION   04/01/2010   normal L main, LAD wit mild stenosis, L Cfx with 70% in-stent restenosis, RCA with 70% in-stent restenosis, LVEF >60% (Dr. K. [place] Hilty) - cutting ballon arthrectomy to RCA & Cfx (Dr. Rande Bushy)   CARDIAC CATHETERIZATION  08/25/2010   preserved global LV contractility; multivessel CAD, diffuse 90-95% in-stent restenosis in prox placed Cfx stent - cutting balloon arthrectomy in Cfx with multiple dilatations 90-95% to 0% stenosis (Dr. Electa Grieve)   CARDIAC CATHETERIZATION  01/26/2011   PCI & stenting of aggresive in-stent restenosis within previously stented AV groove Cfx with 3.0x64mm Taxus DES (previous stents were Promus) (Dr. Aleda Ammon)   CARDIAC CATHETERIZATION  05/11/2011   preserved LV function, 40% mid LAD stenosis, 30-40% narrowing proximal to stented semgnet of prox Cfx, patent mid RCA stent with smooth 20% narrowing in distal RCA (Dr. Electa Grieve)   CARDIAC CATHETERIZATION  12/15/2011   PCI & stenting of proximal & mid Cfx with DES - 3.0x77mm in proximal, 3.0x22mm in mid (Dr. Aleda Ammon)   CARDIAC CATHETERIZATION N/A 06/10/2015   Procedure: Left Heart Cath and Coronary Angiography;  Surgeon: Mardell Shade, MD;  Location:  [facility] INVASIVE CV LAB;  Service: Cardiovascular;  Laterality: N/A;   CARDIAC CATHETERIZATION  10/25/2018   cardiometabolic testing  08/08/2012   good exercise effort, peak VO2 79% predicted with normal VO2 HR curves (mild deconditioning)   COLONOSCOPY  12/2012   diminutive hyperplastic sigmoid poyp so repeat routine 2024   CORONARY BALLOON ANGIOPLASTY N/A 10/25/2018   Procedure: CORONARY BALLOON ANGIOPLASTY;  Surgeon: Lucendia Rusk, MD;  Location: [facility] INVASIVE CV LAB;  Service: Cardiovascular;  Laterality: N/A;   CORONARY BALLOON ANGIOPLASTY N/A 09/29/2019   Procedure: CORONARY BALLOON ANGIOPLASTY;  Surgeon: Sammy Crisp, MD;  Location: [facility] INVASIVE CV LAB;  Service: Cardiovascular;  Laterality: N/A;   CORONARY ULTRASOUND/IVUS N/A 09/29/2019   Procedure:  Intravascular Ultrasound/IVUS;  Surgeon: Sammy Crisp, MD;  Location: [facility] CV LAB;  Service: Cardiovascular;  Laterality: N/A;   EXCISIONAL HEMORRHOIDECTOMY  1984   LEFT HEART CATH AND CORONARY ANGIOGRAPHY N/A 10/25/2018   Procedure: LEFT HEART CATH AND CORONARY ANGIOGRAPHY;  Surgeon: Lucendia Rusk, MD;  Location: [facility] INVASIVE CV LAB;  Service: Cardiovascular;  Laterality: N/A;   LEFT HEART CATH AND CORONARY ANGIOGRAPHY N/A 09/29/2019   Procedure: LEFT HEART CATH AND CORONARY ANGIOGRAPHY;  Surgeon: Sammy Crisp, MD;  Location: [facility] CV [facility];  Service: Cardiovascular;  Laterality: N/A;   LEFT HEART CATHETERIZATION WITH CORONARY ANGIOGRAM N/A 05/11/2011   Procedure: LEFT HEART CATHETERIZATION WITH CORONARY ANGIOGRAM;  Surgeon: Millicent Ally, MD;  Location: [facility] CATH LAB;  Service: Cardiovascular;  Laterality: N/A;  Possible percutaneous coronary intervention, possible IVUS   LEFT HEART CATHETERIZATION WITH CORONARY ANGIOGRAM N/A 12/15/2011   Procedure: LEFT HEART CATHETERIZATION WITH CORONARY ANGIOGRAM;  Surgeon: Avanell Leigh, MD;  Location: [facility] CATH LAB;  Service: Cardiovascular;  Laterality: N/A;   LEFT HEART CATHETERIZATION WITH CORONARY ANGIOGRAM N/A 09/05/2014   Procedure: LEFT HEART CATHETERIZATION WITH CORONARY ANGIOGRAM;  Surgeon: Odie Benne, MD;  Location: [facility] CATH LAB;  Service: Cardiovascular;  Laterality: N/A;   LIPOMA EXCISION     back of the head   NM MYOCAR PERF WALL MOTION  02/2012   lexiscan myoview; mild perfusion defect in mid inferolateral & basal inferolateral region (infarct/scar); EF 52%, abnormal but ow risk scan   PERCUTANEOUS CORONARY STENT INTERVENTION (PCI-S)  09/05/2014   Procedure: PERCUTANEOUS CORONARY STENT INTERVENTION (PCI-S);  Surgeon: Odie Benne, MD;  Location: [facility] CATH LAB;  Service: Cardiovascular;;   TONSILLECTOMY     Patient Active Problem List   Diagnosis Date Noted   Scrotal pain, right 08/26/2023   Epididymitis,  right 08/26/2023   Complex renal cyst, right, 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/26/2023   Impingement syndrome of right shoulder 07/01/2023   Pain in right shoulder 06/16/2023   Anxiety disorder, unspecified 02/16/2023   Alcohol  use disorder 02/16/2023   Sleep disorder, unspecified 02/16/2023   Alcohol  abuse 02/15/2023   Alcohol  dependence ([name]) 05/16/2022   Low energy 03/06/2022   Moderate alcohol  dependence in early remission ([name]) 02/24/2022   Suicidal ideation 07/07/2021   Generalized anxiety disorder 07/07/2021   Other allergic rhinitis 09/09/2020   Adverse reaction to food, subsequent encounter 09/09/2020   History of bee sting allergy 09/09/2020   Alcohol  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) 03/19/2020   MDD (major depressive disorder), recurrent episode, severe ([name]) 03/18/2020   History of substance abuse ([name]) 01/04/2020   Severe 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 features ([name]) 12/10/2019   Tinea cruris 07/06/2019   Hemoglobin A1c less than 7.0% 07/06/2019   Muscle strain of wrist  04/05/2019   Pain in both wrists 04/05/2019   Anxiety 04/05/2019   Prediabetes 04/05/2019   MDD (major depressive disorder) 01/12/2019   Chest pain 10/25/2018   MDD (major depressive disorder), recurrent severe, without psychosis ([name]) 08/15/2018   Influenza A 07/17/2017   Lactic acidosis    Cough    Nausea and vomiting    Severe episode of recurrent major depressive disorder, without psychotic features ([name])    MDD (major depressive disorder), single episode, severe with psychotic features ([name]) 06/27/2017   MDD (major depressive disorder), severe ([name]) 06/23/2017   Essential hypertension 01/13/2016   Unstable angina ([name])    ED (erectile dysfunction) 02/15/2014   Atypical chest pain 12/14/2011   Allergy to contrast media (used for diagnostic x-rays) 05/12/2011   Dyslipidemia, goal LDL below 70 05/09/2011   DM2 (diabetes mellitus, type 2) ([name]) 07/10/2010   CAD S/P percutaneous  coronary angioplasty 07/10/2010    PCP: Abbey Hobby NP   REFERRING PROVIDER: Persons, Mary PA   REFERRING DIAG: Rt shoulder pain , impingement   THERAPY DIAG:  Acute pain of right shoulder  Rationale for Evaluation and Treatment: Rehabilitation  ONSET DATE: 2-3 mos   SUBJECTIVE:                                                                                                                                                                                      SUBJECTIVE STATEMENT: Re- injured his Rt UE.  It feels fine now.  He slipped and caught his fall while moving. Haven't done much exercises since that time.  He no longer takes meds for it. He has missed some appts due to scheduling conflicts.     Eval: Patient presents with right-sided shoulder pain of less than 3 months duration.  He reports pain initially started as he was seated and working on his computer reaching across his body multiple times.  The pain wakes him up at night.  He reports it is hard to lift and rack his bike, complete ADLs bathing dressing and hygiene without increased shoulder pain.  He has not had physical therapy for the shoulder before and has never had surgery.  The right arm feels weak functionally but he does not drop items or have any weakness in his grip.  His pain does not radiate.  Recently had an injection posterior right shoulder but he reports no improvement or change. Hand dominance: Right  PERTINENT HISTORY: MI, multiple cardiac catheterizations  PAIN:  Are you having pain? Yes: NPRS scale: none at rest, can still be severe with lifting  Pain location: Rt  lateral shoulder  Pain description: sharp, achy  Aggravating factors:  using it  Relieving factors: rest, ice pack/hot shower   PRECAUTIONS: None  RED FLAGS: None   WEIGHT BEARING RESTRICTIONS: No  FALLS:  Has patient fallen in last 6 months? 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  DIAGNOSTIC FINDINGS:  None  PATIENT SURVEYS:  FOTO 53%  COGNITION: Overall cognitive status: Within functional limits for tasks assessed     SENSATION: WFL  POSTURE: Good sitting posture without cues   UPPER EXTREMITY MMT   MMT Right eval Left eval Rt.  09/09/23 Rt.  10/28/23  Shoulder flexion 4+ pain  5 4+ pain 4+ pain   Shoulder extension      Shoulder abduction 4+ pain  5 4+ pain  4+ pain   Shoulder adduction      Shoulder internal rotation 5   4+ pain 5/5  Shoulder external rotation 5 pain   4+ pain  5/5  Elbow flexion      Elbow extension      Wrist flexion      Wrist extension      Wrist ulnar deviation      Wrist radial deviation      Wrist pronation      Wrist supination      (Blank rows = not tested)  UPPER EXTREMITY ROM   AROM  Right eval Left eval Rt. 09/09/23 Rt 09/22/23 Rt. 10/28/23  Shoulder flexion 165  143 150 150  Shoulder extension 160      Shoulder abduction   125 110 130  Shoulder adduction       Shoulder internal rotation Functional reach toT 7  L1  Upper lumbar   Shoulder external rotation Functional reach to T3, full in supine with arm at 90 deg abd  T2  T2  Grip strength (lbs) 83 pounds 79 pounds   Rt. 75 Lt. 80   (Blank rows = not tested)  SHOULDER SPECIAL TESTS: Impingement tests: Neer impingement test: positive  Pain with cross body adduction  JOINT MOBILITY TESTING:  Generalized joint stiffness in all directions of right glenohumeral motion   PALPATION:  Min TTP to the right anterolateral aspect of shoulder, no crepitus                                                                                                                               TREATMENT DATE:   [facility] Adult PT Treatment:  DATE: 10/28/23 UBE each direction level 1 , for 5 min  Measure AROM and MMT  Grip  Standing AAROM flexion with dowel Abduction, extension, IR DASH    [facility] Adult PT Treatment:                                                DATE: 09/22/23 Therapeutic Exercise: Supine chest pres  Supine pullover  Supine protraction  Supine horiz abdct /addct with dowel Supine ER blue band 10 x 2  Supine horiz abdct Blue 10 x 2  Supine right Diagonal GTB, Red TB Side lying shoulder abduction, flexion, horizontal abduction Right shoulder PROM Updated HEP Manual Therapy: Right shoulder distraction, inferior and A/P glides     Education details: Plan of care, impingement versus tendinitis, posture rotator cuff and scapula model, home program Person educated: Patient Education method: Explanation, Demonstration, Verbal cues, and Handouts Education comprehension: verbalized understanding and needs further education  HOME EXERCISE PROGRAM: Access Code: [email or web address] URL: [email or web address] Date: 07/15/2023 Prepared by: Marci Setter  Exercises - Shoulder External Rotation and Scapular Retraction with Resistance  - 1-2 x daily - 7 x weekly - 2 sets - 10 reps - 5 hold - Sidelying Shoulder Abduction with Resistance to 60 Degrees  - 1-2 x daily - 7 x weekly - 2 sets - 10 reps - 5 hold - Standing Shoulder Horizontal Abduction with Resistance  - 1-2 x daily - 7 x weekly - 2 sets - 10 reps - 5 hold -Open book and corner - AAROM   09/22/23 - Supine PNF D2 Flexion with Resistance  - 1 x daily - 7 x weekly - 2 sets - 10 reps  ASSESSMENT:  CLINICAL IMPRESSION: Pt has not been in PT for several weeks.  He unfortunately re-injured it as he nearly fell.  He did OH press yesterday and he noted it was OK, this was the first  time since he re-injured it. He has a bit of pain with reaching out and overhead which he hopes he can ameliorate with a few more sessions of PT.  He was encouraged to cont with HEP and avoid heavy lifting with OH lifting. HE will benefit from skilled PT for 4-6 more visits to full address functional limitations.     Re-eval: Patient was renewed today for more visits.  He has completed only 4 visits due to other health issues. He continues to have min to no pain at rest.  Pain can be sharp and severe when lifting heavy items.  AROM is actually a bit less than when he began. Refocused on shoulder mobility and fully reviewed his HEP.  He will continue to benefit from skilled PT to restore pain free motion and strength.    OBJECTIVE IMPAIRMENTS: decreased mobility, decreased ROM, decreased strength, increased fascial restrictions, impaired flexibility, impaired UE functional use, and pain.   ACTIVITY LIMITATIONS: carrying, lifting, sleeping, reach over head, and hygiene/grooming  PARTICIPATION LIMITATIONS: interpersonal relationship and community activity  PERSONAL FACTORS: Past/current experiences and 1 comorbidity: Cardiac issues  are also affecting patient's functional outcome.   REHAB POTENTIAL: Excellent  CLINICAL DECISION MAKING: Unstable/unpredictable  EVALUATION COMPLEXITY: Low   GOALS: Goals reviewed with patient? Yes  LONG TERM GOALS: Target date: 10/21/2023  Patient will be independent with home exercise program for right upper extremity Baseline: cont to work on this  Goal  status: ongoing   2.  Patient will increase right shoulder strength to 5 out of 5 in all planes of motion without increased pain Baseline: 4+/5 with  min pain  Goal status: ongoing, 10/28/23  3.  Patient will be able to rack his bike and city bus without increased pain 75% of the time Baseline:  Goal status: MET   4. DASH score will improve to 35% or less  in order to demonstrate improved functional  mobility Baseline: 43% Goal status: NEW    5. Pt will be able to demo shoulder AROM without pain in all directions    Baseline:see above     Min pain with flexion and abduction can 5/10 but it stops when he stops.    Goal status: ongoing   PLAN:   PT FREQUENCY: 1x/week  PT DURATION: 6 weeks  PLANNED INTERVENTIONS: 97164- PT Re-evaluation, 97110-Therapeutic exercises, 97530- Therapeutic activity, 97112- Neuromuscular re-education, 97535- Self Care, 16109- Manual therapy, Taping, Dry Needling, Joint mobilization, Cryotherapy, and Moist heat  PLAN FOR NEXT SESSION: shoulder mobilty, (AC joint OA )posture, general spine and shoulder mobility, review lifting technique   Marci Setter, PT 10/28/23 12:24 PM Phone: [phone] Fax: [phone]   For all possible CPT codes, reference the Planned Interventions line above.     Check all conditions that are expected to impact treatment: {Conditions expected to impact treatment:None of these apply   If treatment provided at initial evaluation, no treatment charged due to lack of authorization.

## 2023-10-29 ENCOUNTER — Other Ambulatory Visit: Payer: Self-pay | Admitting: Nurse Practitioner

## 2023-11-11 ENCOUNTER — Encounter: Payer: Self-pay | Admitting: Physical Therapy

## 2023-11-11 ENCOUNTER — Ambulatory Visit: Payer: MEDICAID | Admitting: Physical Therapy

## 2023-11-11 DIAGNOSIS — M25511 Pain in right shoulder: Secondary | ICD-10-CM | POA: Diagnosis not present

## 2023-11-11 NOTE — Therapy (Signed)
 OUTPATIENT PHYSICAL THERAPY SHOULDER TREATMENT     Patient Name: [name] MRN: [id number] DOB:28-Jul-1960, 63 y.o., male Today's Date: 11/11/2023  END OF SESSION:  PT End of Session - 11/11/23 0718     Visit Number 7    Number of Visits 12    Date for PT Re-Evaluation 12/09/23    Authorization Type [employer]    Authorization Time Period 10/28/23-12/11/23; 6 visits    Authorization - Visit Number 2    Authorization - Number of Visits 6    PT Start Time 0717    PT Stop Time 0756    PT Time Calculation (min) 39 min              Past Medical History:  Diagnosis Date   Alcohol  dependence ([name])    Allergies    Arthritis    Chest pain    Chronic lower back pain    Chronic pain of right wrist    Cocaine abuse ([name])    per note on 01/18/23   Coronary artery disease    a. Multiple prior caths/PCI. Cath 2013 with possible spasm of RCA, 70% ISR of mid LCx with subsequent DES to mLCx and prox LCX. b. H/o microvascular angina. c. Recurrent angina 08/2014 - s/p PTCA/DES to prox Cx, PTCA/CBA to OM1.  c. LHC 06/10/15 with patent stents and some ISR in LCX and OM-1 that was not flow limiting --> Rx    Dyslipidemia    a. Intolerant to many statins except tolerating Livalo .   GERD (gastroesophageal reflux disease)    H/O cardiac catheterization 10/25/2018   Heart attack [name])    Hypertension    Myocardial infarction [facility]) ~ 2010   Renal mass    S/P angioplasty with stent, DES, to proximal and mid LCX 12/15/11 12/15/2011   S/P foot surgery, right 04/2021   Shoulder pain    Stroke ([name])    pt. reports had a stroke around time of MI 2010   Type II diabetes mellitus ([name])    Unstable angina ([name])    Past Surgical History:  Procedure Laterality Date   CARDIAC CATHETERIZATION  06/15/2002   LAD with prox 40% stenosis, norma L main, Cfx with 25% lesion, RCA with long mid 25% stenosis (Dr. Arlene Lacy)   CARDIAC CATHETERIZATION  04/01/2010   normal L main, LAD wit mild stenosis,  L Cfx with 70% in-stent restenosis, RCA with 70% in-stent restenosis, LVEF >60% (Dr. K. Italy Hilty) - cutting ballon arthrectomy to RCA & Cfx (Dr. Rande Bushy)   CARDIAC CATHETERIZATION  08/25/2010   preserved global LV contractility; multivessel CAD, diffuse 90-95% in-stent restenosis in prox placed Cfx stent - cutting balloon arthrectomy in Cfx with multiple dilatations 90-95% to 0% stenosis (Dr. Electa Grieve)   CARDIAC CATHETERIZATION  01/26/2011   PCI & stenting of aggresive in-stent restenosis within previously stented AV groove Cfx with 3.0x42mm Taxus DES (previous stents were Promus) (Dr. Aleda Ammon)   CARDIAC CATHETERIZATION  05/11/2011   preserved LV function, 40% mid LAD stenosis, 30-40% narrowing proximal to stented semgnet of prox Cfx, patent mid RCA stent with smooth 20% narrowing in distal RCA (Dr. Electa Grieve)   CARDIAC CATHETERIZATION  12/15/2011   PCI & stenting of proximal & mid Cfx with DES - 3.0x10mm in proximal, 3.0x48mm in mid (Dr. Aleda Ammon)   CARDIAC CATHETERIZATION N/A 06/10/2015   Procedure: Left Heart Cath and Coronary Angiography;  Surgeon: Mardell Shade, MD;  Location: [facility] INVASIVE CV  LAB;  Service: Cardiovascular;  Laterality: N/A;   CARDIAC CATHETERIZATION  10/25/2018   cardiometabolic testing  08/08/2012   good exercise effort, peak VO2 79% predicted with normal VO2 HR curves (mild deconditioning)   COLONOSCOPY  12/2012   diminutive hyperplastic sigmoid poyp so repeat routine 2024   CORONARY BALLOON ANGIOPLASTY N/A 10/25/2018   Procedure: CORONARY BALLOON ANGIOPLASTY;  Surgeon: Lucendia Rusk, MD;  Location: [facility] INVASIVE CV LAB;  Service: Cardiovascular;  Laterality: N/A;   CORONARY BALLOON ANGIOPLASTY N/A 09/29/2019   Procedure: CORONARY BALLOON ANGIOPLASTY;  Surgeon: Sammy Crisp, MD;  Location: [facility] INVASIVE CV LAB;  Service: Cardiovascular;  Laterality: N/A;   CORONARY ULTRASOUND/IVUS N/A 09/29/2019   Procedure: Intravascular Ultrasound/IVUS;  Surgeon: Sammy Crisp,  MD;  Location: [facility] INVASIVE CV LAB;  Service: Cardiovascular;  Laterality: N/A;   EXCISIONAL HEMORRHOIDECTOMY  1984   LEFT HEART CATH AND CORONARY ANGIOGRAPHY N/A 10/25/2018   Procedure: LEFT HEART CATH AND CORONARY ANGIOGRAPHY;  Surgeon: Lucendia Rusk, MD;  Location: [facility] INVASIVE CV LAB;  Service: Cardiovascular;  Laterality: N/A;   LEFT HEART CATH AND CORONARY ANGIOGRAPHY N/A 09/29/2019   Procedure: LEFT HEART CATH AND CORONARY ANGIOGRAPHY;  Surgeon: Sammy Crisp, MD;  Location: [facility] INVASIVE CV LAB;  Service: Cardiovascular;  Laterality: N/A;   LEFT HEART CATHETERIZATION WITH CORONARY ANGIOGRAM N/A 05/11/2011   Procedure: LEFT HEART CATHETERIZATION WITH CORONARY ANGIOGRAM;  Surgeon: Millicent Ally, MD;  Location: [facility] CATH LAB;  Service: Cardiovascular;  Laterality: N/A;  Possible percutaneous coronary intervention, possible IVUS   LEFT HEART CATHETERIZATION WITH CORONARY ANGIOGRAM N/A 12/15/2011   Procedure: LEFT HEART CATHETERIZATION WITH CORONARY ANGIOGRAM;  Surgeon: Avanell Leigh, MD;  Location: [facility] CATH LAB;  Service: Cardiovascular;  Laterality: N/A;   LEFT HEART CATHETERIZATION WITH CORONARY ANGIOGRAM N/A 09/05/2014   Procedure: LEFT HEART CATHETERIZATION WITH CORONARY ANGIOGRAM;  Surgeon: Odie Benne, MD;  Location: [facility] CATH LAB;  Service: Cardiovascular;  Laterality: N/A;   LIPOMA EXCISION     back of the head   NM MYOCAR PERF WALL MOTION  02/2012   lexiscan myoview; mild perfusion defect in mid inferolateral & basal inferolateral region (infarct/scar); EF 52%, abnormal but ow risk scan   PERCUTANEOUS CORONARY STENT INTERVENTION (PCI-S)  09/05/2014   Procedure: PERCUTANEOUS CORONARY STENT INTERVENTION (PCI-S);  Surgeon: Odie Benne, MD;  Location: [facility] CATH LAB;  Service: Cardiovascular;;   TONSILLECTOMY     Patient Active Problem List   Diagnosis Date Noted   Scrotal pain, right 08/26/2023   Epididymitis, right 08/26/2023   Complex renal cyst, right, Bosniak IIF  08/26/2023   Impingement syndrome of right shoulder 07/01/2023   Pain in right shoulder 06/16/2023   Anxiety disorder, unspecified 02/16/2023   Alcohol  use disorder 02/16/2023   Sleep disorder, unspecified 02/16/2023   Alcohol  abuse 02/15/2023   Alcohol  dependence ([name]) 05/16/2022   Low energy 03/06/2022   Moderate alcohol  dependence in early remission ([name]) 02/24/2022   Suicidal ideation 07/07/2021   Generalized anxiety disorder 07/07/2021   Other allergic rhinitis 09/09/2020   Adverse reaction to food, subsequent encounter 09/09/2020   History of bee sting allergy 09/09/2020   Alcohol  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) 03/19/2020   MDD (major depressive disorder), recurrent episode, severe ([name]) 03/18/2020   History of substance abuse ([name]) 01/04/2020   Severe 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 features ([name]) 12/10/2019   Tinea cruris 07/06/2019   Hemoglobin A1c less than 7.0% 07/06/2019   Muscle strain of wrist 04/05/2019  Pain in both wrists 04/05/2019   Anxiety 04/05/2019   Prediabetes 04/05/2019   MDD (major depressive disorder) 01/12/2019   Chest pain 10/25/2018   MDD (major depressive disorder), recurrent severe, without psychosis ([name]) 08/15/2018   Influenza A 07/17/2017   Lactic acidosis    Cough    Nausea and vomiting    Severe episode of recurrent major depressive disorder, without psychotic features ([name])    MDD (major depressive disorder), single episode, severe with psychotic features ([name]) 06/27/2017   MDD (major depressive disorder), severe ([name]) 06/23/2017   Essential hypertension 01/13/2016   Unstable angina ([name])    ED (erectile dysfunction) 02/15/2014   Atypical chest pain 12/14/2011   Allergy to contrast media (used for diagnostic x-rays) 05/12/2011   Dyslipidemia, goal LDL below 70 05/09/2011   DM2 (diabetes mellitus, type 2) ([name]) 07/10/2010   CAD S/P percutaneous coronary angioplasty 07/10/2010    PCP: Abbey Hobby NP    REFERRING PROVIDER: Persons, Mary PA   REFERRING DIAG: Rt shoulder pain , impingement   THERAPY DIAG:  Acute pain of right shoulder  Rationale for Evaluation and Treatment: Rehabilitation  ONSET DATE: 2-3 mos   SUBJECTIVE:                                                                                                                                                                                      SUBJECTIVE STATEMENT: No pain since last visit. I am impressed.    Re-eval: Re- injured his Rt UE.  It feels fine now.  He slipped and caught his fall while moving. Haven't done much exercises since that time.  He no longer takes meds for it. He has missed some appts due to scheduling conflicts.     Eval: Patient presents with right-sided shoulder pain of less than 3 months duration.  He reports pain initially started as he was seated and working on his computer reaching across his body multiple times.  The pain wakes him up at night.  He reports it is hard to lift and rack his bike, complete ADLs bathing dressing and hygiene without increased shoulder pain.  He has not had physical therapy for the shoulder before and has never had surgery.  The right arm feels weak functionally but he does not drop items or have any weakness in his grip.  His pain does not radiate.  Recently had an injection posterior right shoulder but he reports no improvement or change. Hand dominance: Right  PERTINENT HISTORY: MI, multiple cardiac catheterizations  PAIN:  Are you having pain? Yes: NPRS scale: none at rest, can still be severe with lifting  Pain location: Rt  lateral  shoulder  Pain description: sharp, achy  Aggravating factors: using it  Relieving factors: rest, ice pack/hot shower   PRECAUTIONS: None  RED FLAGS: None   WEIGHT BEARING RESTRICTIONS: No  FALLS:  Has patient fallen in last 6 months? 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  DIAGNOSTIC FINDINGS:  None  PATIENT SURVEYS:  FOTO 53%-Deferred   COGNITION: Overall cognitive status: Within functional limits for tasks assessed     SENSATION: WFL  POSTURE: Good sitting posture without cues   UPPER EXTREMITY MMT   MMT Right eval Left eval Rt.  09/09/23 Rt.  10/28/23  Shoulder flexion 4+ pain  5 4+ pain 4+ pain   Shoulder extension      Shoulder abduction 4+ pain  5 4+ pain  4+ pain   Shoulder adduction      Shoulder internal rotation 5   4+ pain 5/5  Shoulder external rotation 5 pain   4+ pain  5/5  Elbow flexion      Elbow extension      Wrist flexion      Wrist extension      Wrist ulnar deviation      Wrist radial deviation      Wrist pronation      Wrist supination      (Blank rows = not tested)  UPPER EXTREMITY ROM   AROM  Right eval Left eval Rt. 09/09/23 Rt 09/22/23 Rt. 10/28/23 RT 11/11/23  Shoulder flexion 165  143 150 150 160  Shoulder extension 160       Shoulder abduction   125 110 130 145 Pain  Shoulder adduction        Shoulder internal rotation Functional reach toT 7  L1  Upper lumbar    Shoulder external rotation Functional reach to T3, full in supine with arm at 90 deg abd  T2  T2   Grip strength (lbs) 83 pounds 79 pounds   Rt. 75 Lt. 80    (Blank rows = not tested)  SHOULDER SPECIAL TESTS: Impingement tests: Neer impingement test: positive  Pain with cross body adduction  JOINT MOBILITY TESTING:  Generalized joint stiffness in all directions of right glenohumeral motion   PALPATION:  Min TTP to the right anterolateral aspect of shoulder, no crepitus                                                                                                                               TREATMENT DATE:  [facility] Adult PT Treatment:  DATE: 11/11/23 Therapeutic Exercise: Standing GTB ER bilat 10 x 2  OH press using both arms 10# 10 x 2  Standing Star Pattern alternating GTB 2 x 10 Side lying shoulder abduction 2# Updated HEP   Therapeutic Activity: UBE Shoulder pulley scaption Wall angels x 10  UE closed chain protraction/ retraction x 10  Wall slides AAROM thumb up, palm down  Supine dowell pullovers     [facility] Adult PT Treatment:                                                DATE: 10/28/23 UBE each direction level 1 , for 5 min  Measure AROM and MMT  Grip  Standing AAROM flexion with dowel Abduction, extension, IR DASH    [facility] Adult PT Treatment:                                                DATE: 09/22/23 Therapeutic Exercise: Supine chest pres  Supine pullover  Supine protraction  Supine horiz abdct /addct with dowel Supine ER blue band 10 x 2  Supine horiz abdct Blue 10 x 2  Supine right Diagonal GTB, Red TB Side lying shoulder abduction, flexion, horizontal abduction Right shoulder PROM Updated HEP Manual Therapy: Right shoulder distraction, inferior and A/P glides     Education details: Plan of care, impingement versus tendinitis, posture rotator cuff and scapula model, home program Person educated: Patient Education method: Explanation, Demonstration, Verbal cues, and Handouts Education comprehension: verbalized understanding and needs further education  HOME EXERCISE PROGRAM: Access Code: [email or web address] URL: [email or web address] Date: 07/15/2023 Prepared by: Marci Setter  Exercises - Shoulder External Rotation and Scapular Retraction with Resistance  - 1-2 x daily - 7 x weekly - 2 sets - 10 reps - 5 hold - Sidelying Shoulder Abduction with Resistance to 60 Degrees   - 1-2 x daily - 7 x weekly - 2 sets - 10 reps - 5 hold - Standing Shoulder Horizontal Abduction with Resistance  - 1-2 x daily - 7 x weekly - 2 sets - 10 reps - 5 hold -Open book and corner - AAROM   09/22/23 - Supine PNF D2 Flexion with Resistance  - 1 x daily - 7 x weekly - 2 sets - 10 reps 11/11/23 - Wall Angels  - 1 x daily - 7 x weekly - 2 sets - 10 reps - Standing Overhead Press with Dumbbells at [employer]  - 1 x daily - 7 x weekly - 2-3 sets - 10 reps - Alternating star pattern  - 1 x daily - 7 x weekly - 1-2 sets - 10 reps  ASSESSMENT:  CLINICAL IMPRESSION: Pt reports he sees improvement in shoulder. No pain since last visit 2 weeks ago. AROM for shoulder abduction has pain at end range today. Progressed with scapular strength and OH lifting with good tolerance. Updated HEP.    RE-eval: Pt has not been in PT for several weeks.  He unfortunately re-injured it as he nearly fell.  He did OH press yesterday and he noted it was OK, this was the first time since he re-injured it. He has a bit of pain with reaching out and overhead which he hopes he can ameliorate with  a few more sessions of PT.  He was encouraged to cont with HEP and avoid heavy lifting with OH lifting. HE will benefit from skilled PT for 4-6 more visits to full address functional limitations.     Re-eval: Patient was renewed today for more visits.  He has completed only 4 visits due to other health issues. He continues to have min to no pain at rest.  Pain can be sharp and severe when lifting heavy items.  AROM is actually a bit less than when he began. Refocused on shoulder mobility and fully reviewed his HEP.  He will continue to benefit from skilled PT to restore pain free motion and strength.    OBJECTIVE IMPAIRMENTS: decreased mobility, decreased ROM, decreased strength, increased fascial restrictions, impaired flexibility, impaired UE functional use, and pain.   ACTIVITY LIMITATIONS: carrying, lifting, sleeping, reach  over head, and hygiene/grooming  PARTICIPATION LIMITATIONS: interpersonal relationship and community activity  PERSONAL FACTORS: Past/current experiences and 1 comorbidity: Cardiac issues are also affecting patient's functional outcome.   REHAB POTENTIAL: Excellent  CLINICAL DECISION MAKING: Unstable/unpredictable  EVALUATION COMPLEXITY: Low   GOALS: Goals reviewed with patient? Yes  LONG TERM GOALS: Target date: 12/09/2023  Patient will be independent with home exercise program for right upper extremity Baseline: cont to work on this  Goal status: ongoing   2.  Patient will increase right shoulder strength to 5 out of 5 in all planes of motion without increased pain Baseline: 4+/5 with  min pain  Goal status: ongoing, 10/28/23  3.  Patient will be able to rack his bike and city bus without increased pain 75% of the time Baseline:  Goal status: MET   4. DASH score will improve to 35% or less  in order to demonstrate improved functional mobility Baseline: 43% Goal status: NEW    5. Pt will be able to demo shoulder AROM without pain in all directions    Baseline:see above     Min pain with flexion and abduction can 5/10 but it stops when he stops.    11/11/23: pain with abduction   Goal status: ongoing   PLAN:   PT FREQUENCY: 1x/week  PT DURATION: 6 weeks  PLANNED INTERVENTIONS: 97164- PT Re-evaluation, 97110-Therapeutic exercises, 97530- Therapeutic activity, 97112- Neuromuscular re-education, 97535- Self Care, 19147- Manual therapy, Taping, Dry Needling, Joint mobilization, Cryotherapy, and Moist heat  PLAN FOR NEXT SESSION: shoulder mobilty, (AC joint OA )posture, general spine and shoulder mobility, review lifting technique   Gasper Karst, PTA 11/11/23 7:57 AM Phone: [phone] Fax: [phone]   For all possible CPT codes, reference the Planned Interventions line above.     Check all conditions that are expected to impact treatment: {Conditions expected to  impact treatment:None of these apply   If treatment provided at initial evaluation, no treatment charged due to lack of authorization.

## 2023-11-18 ENCOUNTER — Ambulatory Visit: Payer: MEDICAID | Admitting: Physical Therapy

## 2023-11-18 ENCOUNTER — Encounter: Payer: Self-pay | Admitting: Physical Therapy

## 2023-11-18 ENCOUNTER — Other Ambulatory Visit: Payer: Self-pay | Admitting: Nurse Practitioner

## 2023-11-18 DIAGNOSIS — M25511 Pain in right shoulder: Secondary | ICD-10-CM | POA: Diagnosis not present

## 2023-11-18 DIAGNOSIS — G8929 Other chronic pain: Secondary | ICD-10-CM

## 2023-11-18 NOTE — Therapy (Addendum)
 OUTPATIENT PHYSICAL THERAPY SHOULDER TREATMENT  Addended Discharge    Patient Name: [name] MRN: [id number] DOB:1960-12-24, 63 y.o., male Today's Date: 11/18/2023    PHYSICAL THERAPY DISCHARGE SUMMARY  Visits from Start of Care: 8  Current functional level related to goals / functional outcomes: See below for most recent info    Remaining deficits: See below    Education / Equipment: HEP, posture and body mechanics    Patient agrees to discharge. Patient goals were partially met. Patient is being discharged due to a change in medical status.  Patient has had severe low back pain and will need to be seen by Orthopedic.  DC shoulder and may come to PT for his back if PT is warranted.     Marci Setter, PT 12/01/23 8:26 AM Phone: [phone] Fax: [phone]  END OF SESSION:  PT End of Session - 11/18/23 0848     Visit Number 8    Number of Visits 12    Date for PT Re-Evaluation 12/09/23    Authorization Type [employer]    Authorization Time Period 10/28/23-12/11/23; 6 visits    Authorization - Visit Number 3    Authorization - Number of Visits 6    PT Start Time 0845    PT Stop Time 0925    PT Time Calculation (min) 40 min              Past Medical History:  Diagnosis Date   Alcohol  dependence ([name])    Allergies    Arthritis    Chest pain    Chronic lower back pain    Chronic pain of right wrist    Cocaine abuse ([name])    per note on 01/18/23   Coronary artery disease    a. Multiple prior caths/PCI. Cath 2013 with possible spasm of RCA, 70% ISR of mid LCx with subsequent DES to mLCx and prox LCX. b. H/o microvascular angina. c. Recurrent angina 08/2014 - s/p PTCA/DES to prox Cx, PTCA/CBA to OM1.  c. LHC 06/10/15 with patent stents and some ISR in LCX and OM-1 that was not flow limiting --> Rx    Dyslipidemia    a. Intolerant to many statins except tolerating Livalo .   GERD (gastroesophageal reflux disease)    H/O cardiac catheterization  10/25/2018   Heart attack [facility])    Hypertension    Myocardial infarction [facility]) ~ 2010   Renal mass    S/P angioplasty with stent, DES, to proximal and mid LCX 12/15/11 12/15/2011   S/P foot surgery, right 04/2021   Shoulder pain    Stroke ([name])    pt. reports had a stroke around time of MI 2010   Type II diabetes mellitus ([name])    Unstable angina [facility])    Past Surgical History:  Procedure Laterality Date   CARDIAC CATHETERIZATION  06/15/2002   LAD with prox 40% stenosis, norma L main, Cfx with 25% lesion, RCA with long mid 25% stenosis (Dr. Arlene Lacy)   CARDIAC CATHETERIZATION  04/01/2010   normal L main, LAD wit mild stenosis, L Cfx with 70% in-stent restenosis, RCA with 70% in-stent restenosis, LVEF >60% (Dr. K. [place] Hilty) - cutting ballon arthrectomy to RCA & Cfx (Dr. Rande Bushy)   CARDIAC CATHETERIZATION  08/25/2010   preserved global LV contractility; multivessel CAD, diffuse 90-95% in-stent restenosis in prox placed Cfx stent - cutting balloon arthrectomy in Cfx with multiple dilatations 90-95% to 0% stenosis (Dr. Electa Grieve)   CARDIAC CATHETERIZATION  01/26/2011  PCI & stenting of aggresive in-stent restenosis within previously stented AV groove Cfx with 3.0x67mm Taxus DES (previous stents were Promus) (Dr. Aleda Ammon)   CARDIAC CATHETERIZATION  05/11/2011   preserved LV function, 40% mid LAD stenosis, 30-40% narrowing proximal to stented semgnet of prox Cfx, patent mid RCA stent with smooth 20% narrowing in distal RCA (Dr. Electa Grieve)   CARDIAC CATHETERIZATION  12/15/2011   PCI & stenting of proximal & mid Cfx with DES - 3.0x78mm in proximal, 3.0x73mm in mid (Dr. Aleda Ammon)   CARDIAC CATHETERIZATION N/A 06/10/2015   Procedure: Left Heart Cath and Coronary Angiography;  Surgeon: Mardell Shade, MD;  Location: [facility] INVASIVE CV LAB;  Service: Cardiovascular;  Laterality: N/A;   CARDIAC CATHETERIZATION  10/25/2018   cardiometabolic testing  08/08/2012   good exercise effort, peak VO2 79%  predicted with normal VO2 HR curves (mild deconditioning)   COLONOSCOPY  12/2012   diminutive hyperplastic sigmoid poyp so repeat routine 2024   CORONARY BALLOON ANGIOPLASTY N/A 10/25/2018   Procedure: CORONARY BALLOON ANGIOPLASTY;  Surgeon: Lucendia Rusk, MD;  Location: [facility] INVASIVE CV LAB;  Service: Cardiovascular;  Laterality: N/A;   CORONARY BALLOON ANGIOPLASTY N/A 09/29/2019   Procedure: CORONARY BALLOON ANGIOPLASTY;  Surgeon: Sammy Crisp, MD;  Location: [facility] INVASIVE CV LAB;  Service: Cardiovascular;  Laterality: N/A;   CORONARY ULTRASOUND/IVUS N/A 09/29/2019   Procedure: Intravascular Ultrasound/IVUS;  Surgeon: Sammy Crisp, MD;  Location: [facility] INVASIVE CV LAB;  Service: Cardiovascular;  Laterality: N/A;   EXCISIONAL HEMORRHOIDECTOMY  1984   LEFT HEART CATH AND CORONARY ANGIOGRAPHY N/A 10/25/2018   Procedure: LEFT HEART CATH AND CORONARY ANGIOGRAPHY;  Surgeon: Lucendia Rusk, MD;  Location: [facility] INVASIVE CV LAB;  Service: Cardiovascular;  Laterality: N/A;   LEFT HEART CATH AND CORONARY ANGIOGRAPHY N/A 09/29/2019   Procedure: LEFT HEART CATH AND CORONARY ANGIOGRAPHY;  Surgeon: Sammy Crisp, MD;  Location: [facility] INVASIVE CV LAB;  Service: Cardiovascular;  Laterality: N/A;   LEFT HEART CATHETERIZATION WITH CORONARY ANGIOGRAM N/A 05/11/2011   Procedure: LEFT HEART CATHETERIZATION WITH CORONARY ANGIOGRAM;  Surgeon: Millicent Ally, MD;  Location: [facility] CATH LAB;  Service: Cardiovascular;  Laterality: N/A;  Possible percutaneous coronary intervention, possible IVUS   LEFT HEART CATHETERIZATION WITH CORONARY ANGIOGRAM N/A 12/15/2011   Procedure: LEFT HEART CATHETERIZATION WITH CORONARY ANGIOGRAM;  Surgeon: Avanell Leigh, MD;  Location: [facility] CATH LAB;  Service: Cardiovascular;  Laterality: N/A;   LEFT HEART CATHETERIZATION WITH CORONARY ANGIOGRAM N/A 09/05/2014   Procedure: LEFT HEART CATHETERIZATION WITH CORONARY ANGIOGRAM;  Surgeon: Odie Benne, MD;  Location: [facility] CATH LAB;  Service:  Cardiovascular;  Laterality: N/A;   LIPOMA EXCISION     back of the head   NM MYOCAR PERF WALL MOTION  02/2012   lexiscan myoview; mild perfusion defect in mid inferolateral & basal inferolateral region (infarct/scar); EF 52%, abnormal but ow risk scan   PERCUTANEOUS CORONARY STENT INTERVENTION (PCI-S)  09/05/2014   Procedure: PERCUTANEOUS CORONARY STENT INTERVENTION (PCI-S);  Surgeon: Odie Benne, MD;  Location: [facility] CATH LAB;  Service: Cardiovascular;;   TONSILLECTOMY     Patient Active Problem List   Diagnosis Date Noted   Scrotal pain, right 08/26/2023   Epididymitis, right 08/26/2023   Complex renal cyst, right, 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/26/2023   Impingement syndrome of right shoulder 07/01/2023   Pain in right shoulder 06/16/2023   Anxiety disorder, unspecified 02/16/2023   Alcohol  use disorder 02/16/2023   Sleep disorder, unspecified 02/16/2023   Alcohol  abuse 02/15/2023  Alcohol  dependence ([name]) 05/16/2022   Low energy 03/06/2022   Moderate alcohol  dependence in early remission ([name]) 02/24/2022   Suicidal ideation 07/07/2021   Generalized anxiety disorder 07/07/2021   Other allergic rhinitis 09/09/2020   Adverse reaction to food, subsequent encounter 09/09/2020   History of bee sting allergy 09/09/2020   Alcohol  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) 03/19/2020   MDD (major depressive disorder), recurrent episode, severe ([name]) 03/18/2020   History of substance abuse ([name]) 01/04/2020   Severe 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 features ([name]) 12/10/2019   Tinea cruris 07/06/2019   Hemoglobin A1c less than 7.0% 07/06/2019   Muscle strain of wrist 04/05/2019   Pain in both wrists 04/05/2019   Anxiety 04/05/2019   Prediabetes 04/05/2019   MDD (major depressive disorder) 01/12/2019   Chest pain 10/25/2018   MDD (major depressive disorder), recurrent severe, without psychosis ([name]) 08/15/2018   Influenza A 07/17/2017   Lactic acidosis    Cough     Nausea and vomiting    Severe episode of recurrent major depressive disorder, without psychotic features ([name])    MDD (major depressive disorder), single episode, severe with psychotic features ([name]) 06/27/2017   MDD (major depressive disorder), severe ([name]) 06/23/2017   Essential hypertension 01/13/2016   Unstable angina ([name])    ED (erectile dysfunction) 02/15/2014   Atypical chest pain 12/14/2011   Allergy to contrast media (used for diagnostic x-rays) 05/12/2011   Dyslipidemia, goal LDL below 70 05/09/2011   DM2 (diabetes mellitus, type 2) ([name]) 07/10/2010   CAD S/P percutaneous coronary angioplasty 07/10/2010    PCP: Abbey Hobby NP   REFERRING PROVIDER: Persons, Mary PA   REFERRING DIAG: Rt shoulder pain , impingement   THERAPY DIAG:  Acute pain of right shoulder  Rationale for Evaluation and Treatment: Rehabilitation  ONSET DATE: 2-3 mos   SUBJECTIVE:                                                                                                                                                                                      SUBJECTIVE STATEMENT: I am having less pain with activities.    Re-eval: Re- injured his Rt UE.  It feels fine now.  He slipped and caught his fall while moving. Haven't done much exercises since that time.  He no longer takes meds for it. He has missed some appts due to scheduling conflicts.     Eval: Patient presents with right-sided shoulder pain of less than 3 months duration.  He reports pain initially started as he was seated and working on his computer reaching across his body multiple times.  The pain wakes him up at night.  He reports it is hard to lift and rack his bike, complete ADLs bathing dressing and hygiene without increased shoulder pain.  He has not had physical therapy for the shoulder before and has never had surgery.  The right arm feels weak functionally but he does not drop items or have any weakness in his grip.  His pain  does not radiate.  Recently had an injection posterior right shoulder but he reports no improvement or change. Hand dominance: Right  PERTINENT HISTORY: MI, multiple cardiac catheterizations  PAIN:  Are you having pain? Yes: NPRS scale: none at rest, can still be severe with lifting  Pain location: Rt  lateral shoulder  Pain description: sharp, achy  Aggravating factors: using it  Relieving factors: rest, ice pack/hot shower   PRECAUTIONS: None  RED FLAGS: None   WEIGHT BEARING RESTRICTIONS: No  FALLS:  Has patient fallen in last 6 months? 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  DIAGNOSTIC FINDINGS:  None  PATIENT SURVEYS:  FOTO 53%-Deferred   COGNITION: Overall cognitive status: Within functional limits for tasks assessed     SENSATION: WFL  POSTURE: Good sitting posture without cues   UPPER EXTREMITY MMT   MMT Right eval Left eval Rt.  09/09/23 Rt.  10/28/23  Shoulder flexion 4+ pain  5 4+ pain 4+ pain   Shoulder extension      Shoulder abduction 4+ pain  5 4+ pain  4+ pain   Shoulder adduction      Shoulder internal rotation 5   4+ pain 5/5  Shoulder external rotation 5 pain   4+ pain  5/5  Elbow flexion      Elbow extension      Wrist flexion      Wrist extension      Wrist ulnar deviation      Wrist radial deviation      Wrist pronation      Wrist supination      (Blank rows = not tested)  UPPER EXTREMITY ROM   AROM  Right eval Left eval Rt. 09/09/23 Rt 09/22/23 Rt. 10/28/23 RT 11/11/23 RT 11/18/23  Shoulder flexion 165  143 150 150  160   Shoulder extension 160        Shoulder abduction   125 110 130 145 Pain 145 pain  Shoulder adduction         Shoulder internal rotation Functional reach toT 7  L1  Upper lumbar     Shoulder external rotation Functional reach to T3, full in supine with arm at 90 deg abd  T2  T2    Grip strength (lbs) 83 pounds 79 pounds   Rt. 75 Lt. 80     (Blank rows = not tested)  SHOULDER SPECIAL TESTS: Impingement tests: Neer impingement test: positive  Pain with cross body adduction  JOINT MOBILITY TESTING:  Generalized joint stiffness in all directions of right glenohumeral motion   PALPATION:  Min TTP to the right anterolateral aspect of shoulder, no crepitus  TREATMENT DATE:  [facility] Adult PT Treatment:                                                DATE: 11/18/23 Therapeutic Exercise: Standing GTB ER bilat 10 x 3 OH press using both arms 10# 10 x 3 Standing Star Pattern alternating GTB 2 x 10 Standing  lateral raise thumb up 2# 10 x 2   Therapeutic Activity: UBE 4 min Level 2  Seated Row 25# 15 x 2  Seated Lat pull 25# 15 x 2 Wall angels  2 x 10     [facility] Adult PT Treatment:                                                DATE: 11/11/23 Therapeutic Exercise: Standing GTB ER bilat 10 x 2  OH press using both arms 10# 10 x 2  Standing Star Pattern alternating GTB 2 x 10 Side lying shoulder abduction 2# Updated HEP   Therapeutic Activity: UBE Shoulder pulley scaption Wall angels x 10  UE closed chain protraction/ retraction x 10  Wall slides AAROM thumb up, palm down  Supine dowell pullovers     [facility] Adult PT Treatment:                                                DATE: 10/28/23 UBE each direction level 1 , for 5 min  Measure AROM and MMT  Grip  Standing AAROM flexion with dowel Abduction, extension, IR DASH    [facility] Adult PT Treatment:                                                 DATE: 09/22/23 Therapeutic Exercise: Supine chest pres  Supine pullover  Supine protraction  Supine horiz abdct /addct with dowel Supine ER blue band 10 x 2  Supine horiz abdct Blue 10 x 2  Supine right Diagonal GTB, Red TB Side lying shoulder abduction, flexion, horizontal abduction Right shoulder PROM Updated HEP Manual Therapy: Right shoulder distraction, inferior and A/P glides     Education details: Plan of care, impingement versus tendinitis, posture rotator cuff and scapula model, home program Person educated: Patient Education method: Explanation, Demonstration, Verbal cues, and Handouts Education comprehension: verbalized understanding and needs further education  HOME EXERCISE PROGRAM: Access Code: [email or web address] URL: [email or web address] Date: 07/15/2023 Prepared by: Marci Setter  Exercises - Shoulder External Rotation and Scapular Retraction with Resistance  - 1-2 x daily - 7 x weekly - 2 sets - 10 reps - 5 hold - Sidelying Shoulder Abduction with Resistance to 60 Degrees  - 1-2 x daily - 7 x weekly - 2 sets - 10 reps - 5 hold - Standing Shoulder Horizontal Abduction with Resistance  - 1-2 x daily - 7 x weekly - 2 sets - 10 reps - 5 hold -Open book and corner - AAROM   09/22/23 - Supine PNF D2 Flexion with Resistance  -  1 x daily - 7 x weekly - 2 sets - 10 reps 11/11/23 - Wall Angels  - 1 x daily - 7 x weekly - 2 sets - 10 reps - Standing Overhead Press with Dumbbells at [employer]  - 1 x daily - 7 x weekly - 2-3 sets - 10 reps - Alternating star pattern  - 1 x daily - 7 x weekly - 1-2 sets - 10 reps  ASSESSMENT:  CLINICAL IMPRESSION: Pt reports he sees improvement in shoulder, noting more activities that no longer cause pain in shoulder . Continues with min pain at end range AROM for shoulder abduction Progressed with scapular strength and OH lifting with good tolerance. Able to increase reps/resistance today without  increased pain. No changes to HEP.    RE-eval: Pt has not been in PT for several weeks.  He unfortunately re-injured it as he nearly fell.  He did OH press yesterday and he noted it was OK, this was the first time since he re-injured it. He has a bit of pain with reaching out and overhead which he hopes he can ameliorate with a few more sessions of PT.  He was encouraged to cont with HEP and avoid heavy lifting with OH lifting. HE will benefit from skilled PT for 4-6 more visits to full address functional limitations.     Re-eval: Patient was renewed today for more visits.  He has completed only 4 visits due to other health issues. He continues to have min to no pain at rest.  Pain can be sharp and severe when lifting heavy items.  AROM is actually a bit less than when he began. Refocused on shoulder mobility and fully reviewed his HEP.  He will continue to benefit from skilled PT to restore pain free motion and strength.    OBJECTIVE IMPAIRMENTS: decreased mobility, decreased ROM, decreased strength, increased fascial restrictions, impaired flexibility, impaired UE functional use, and pain.   ACTIVITY LIMITATIONS: carrying, lifting, sleeping, reach over head, and hygiene/grooming  PARTICIPATION LIMITATIONS: interpersonal relationship and community activity  PERSONAL FACTORS: Past/current experiences and 1 comorbidity: Cardiac issues are also affecting patient's functional outcome.   REHAB POTENTIAL: Excellent  CLINICAL DECISION MAKING: Unstable/unpredictable  EVALUATION COMPLEXITY: Low   GOALS: Goals reviewed with patient? Yes  LONG TERM GOALS: Target date: 12/09/2023  Patient will be independent with home exercise program for right upper extremity Baseline: cont to work on this  Goal status: ongoing   2.  Patient will increase right shoulder strength to 5 out of 5 in all planes of motion without increased pain Baseline: 4+/5 with  min pain  Goal status: ongoing, 10/28/23  3.   Patient will be able to rack his bike and city bus without increased pain 75% of the time Baseline:  Goal status: MET   4. DASH score will improve to 35% or less  in order to demonstrate improved functional mobility Baseline: 43% Goal status: NEW    5. Pt will be able to demo shoulder AROM without pain in all directions    Baseline:see above     Min pain with flexion and abduction can 5/10 but it stops when he stops.    11/11/23: pain with abduction   11/18/23: "   Goal status: ongoing   PLAN:   PT FREQUENCY: 1x/week  PT DURATION: 6 weeks  PLANNED INTERVENTIONS: 97164- PT Re-evaluation, 97110-Therapeutic exercises, 97530- Therapeutic activity, 97112- Neuromuscular re-education, 97535- Self Care, 16109- Manual therapy, Taping, Dry Needling, Joint mobilization, Cryotherapy, and  Moist heat  PLAN FOR NEXT SESSION: shoulder mobilty, (AC joint OA )posture, general spine and shoulder mobility, review lifting technique   Gasper Karst, PTA 11/18/23 9:52 AM Phone: [phone] Fax: [phone]   For all possible CPT codes, reference the Planned Interventions line above.     Check all conditions that are expected to impact treatment: {Conditions expected to impact treatment:None of these apply   If treatment provided at initial evaluation, no treatment charged due to lack of authorization.

## 2023-11-25 ENCOUNTER — Ambulatory Visit: Payer: MEDICAID | Admitting: Physical Therapy

## 2023-11-26 ENCOUNTER — Ambulatory Visit: Payer: Self-pay | Admitting: Nurse Practitioner

## 2023-11-29 ENCOUNTER — Telehealth (HOSPITAL_COMMUNITY): Payer: Self-pay | Admitting: Licensed Clinical Social Worker

## 2023-11-29 NOTE — Telephone Encounter (Signed)
 The therapist receives a voicemail from [place] saying that he started a new job recently and his anxiety has been out of control.  He is reportedly receiving therapy services at [facility] [place] but asks if he can get connected with the PA-C from [facility] for medication management.  After consulting with Mr. Andria Banks, PA-C, the therapist reaches out to the receptionist to have her contact [place] to get him scheduled on Mr. Andy Keen Thursday afternoon scheduled.  Melynda Stagger, MA, LCSW, [facility], LCAS 11/29/2023

## 2023-11-30 NOTE — Therapy (Unsigned)
 OUTPATIENT PHYSICAL THERAPY SHOULDER TREATMENT     Patient Name: [name] MRN: [id number] DOB:1960-09-23, 63 y.o., male Today's Date: 11/30/2023  END OF SESSION:     Past Medical History:  Diagnosis Date   Alcohol  dependence ([name])    Allergies    Arthritis    Chest pain    Chronic lower back pain    Chronic pain of right wrist    Cocaine abuse ([name])    per note on 01/18/23   Coronary artery disease    a. Multiple prior caths/PCI. Cath 2013 with possible spasm of RCA, 70% ISR of mid LCx with subsequent DES to mLCx and prox LCX. b. H/o microvascular angina. c. Recurrent angina 08/2014 - s/p PTCA/DES to prox Cx, PTCA/CBA to OM1.  c. LHC 06/10/15 with patent stents and some ISR in LCX and OM-1 that was not flow limiting --> Rx    Dyslipidemia    a. Intolerant to many statins except tolerating Livalo .   GERD (gastroesophageal reflux disease)    H/O cardiac catheterization 10/25/2018   Heart attack [facility])    Hypertension    Myocardial infarction [facility]) ~ 2010   Renal mass    S/P angioplasty with stent, DES, to proximal and mid LCX 12/15/11 12/15/2011   S/P foot surgery, right 04/2021   Shoulder pain    Stroke ([name])    pt. reports had a stroke around time of MI 2010   Type II diabetes mellitus ([name])    Unstable angina [facility])    Past Surgical History:  Procedure Laterality Date   CARDIAC CATHETERIZATION  06/15/2002   LAD with prox 40% stenosis, norma L main, Cfx with 25% lesion, RCA with long mid 25% stenosis (Dr. Arlene Lacy)   CARDIAC CATHETERIZATION  04/01/2010   normal L main, LAD wit mild stenosis, L Cfx with 70% in-stent restenosis, RCA with 70% in-stent restenosis, LVEF >60% (Dr. K. Italy Hilty) - cutting ballon arthrectomy to RCA & Cfx (Dr. Rande Bushy)   CARDIAC CATHETERIZATION  08/25/2010   preserved global LV contractility; multivessel CAD, diffuse 90-95% in-stent restenosis in prox placed Cfx stent - cutting balloon arthrectomy in Cfx with multiple dilatations 90-95% to  0% stenosis (Dr. Electa Grieve)   CARDIAC CATHETERIZATION  01/26/2011   PCI & stenting of aggresive in-stent restenosis within previously stented AV groove Cfx with 3.0x58mm Taxus DES (previous stents were Promus) (Dr. Aleda Ammon)   CARDIAC CATHETERIZATION  05/11/2011   preserved LV function, 40% mid LAD stenosis, 30-40% narrowing proximal to stented semgnet of prox Cfx, patent mid RCA stent with smooth 20% narrowing in distal RCA (Dr. Electa Grieve)   CARDIAC CATHETERIZATION  12/15/2011   PCI & stenting of proximal & mid Cfx with DES - 3.0x6mm in proximal, 3.0x71mm in mid (Dr. Aleda Ammon)   CARDIAC CATHETERIZATION N/A 06/10/2015   Procedure: Left Heart Cath and Coronary Angiography;  Surgeon: Mardell Shade, MD;  Location: [facility] INVASIVE CV LAB;  Service: Cardiovascular;  Laterality: N/A;   CARDIAC CATHETERIZATION  10/25/2018   cardiometabolic testing  08/08/2012   good exercise effort, peak VO2 79% predicted with normal VO2 HR curves (mild deconditioning)   COLONOSCOPY  12/2012   diminutive hyperplastic sigmoid poyp so repeat routine 2024   CORONARY BALLOON ANGIOPLASTY N/A 10/25/2018   Procedure: CORONARY BALLOON ANGIOPLASTY;  Surgeon: Lucendia Rusk, MD;  Location: [facility] INVASIVE CV LAB;  Service: Cardiovascular;  Laterality: N/A;   CORONARY BALLOON ANGIOPLASTY N/A 09/29/2019   Procedure: CORONARY BALLOON ANGIOPLASTY;  Surgeon: Sammy Crisp, MD;  Location: [facility] INVASIVE CV LAB;  Service: Cardiovascular;  Laterality: N/A;   CORONARY ULTRASOUND/IVUS N/A 09/29/2019   Procedure: Intravascular Ultrasound/IVUS;  Surgeon: Sammy Crisp, MD;  Location: [facility] INVASIVE CV LAB;  Service: Cardiovascular;  Laterality: N/A;   EXCISIONAL HEMORRHOIDECTOMY  1984   LEFT HEART CATH AND CORONARY ANGIOGRAPHY N/A 10/25/2018   Procedure: LEFT HEART CATH AND CORONARY ANGIOGRAPHY;  Surgeon: Lucendia Rusk, MD;  Location: [facility] INVASIVE CV LAB;  Service: Cardiovascular;  Laterality: N/A;   LEFT HEART CATH AND CORONARY  ANGIOGRAPHY N/A 09/29/2019   Procedure: LEFT HEART CATH AND CORONARY ANGIOGRAPHY;  Surgeon: Sammy Crisp, MD;  Location: [facility] INVASIVE CV LAB;  Service: Cardiovascular;  Laterality: N/A;   LEFT HEART CATHETERIZATION WITH CORONARY ANGIOGRAM N/A 05/11/2011   Procedure: LEFT HEART CATHETERIZATION WITH CORONARY ANGIOGRAM;  Surgeon: Millicent Ally, MD;  Location: [facility] CATH LAB;  Service: Cardiovascular;  Laterality: N/A;  Possible percutaneous coronary intervention, possible IVUS   LEFT HEART CATHETERIZATION WITH CORONARY ANGIOGRAM N/A 12/15/2011   Procedure: LEFT HEART CATHETERIZATION WITH CORONARY ANGIOGRAM;  Surgeon: Avanell Leigh, MD;  Location: [facility] CATH LAB;  Service: Cardiovascular;  Laterality: N/A;   LEFT HEART CATHETERIZATION WITH CORONARY ANGIOGRAM N/A 09/05/2014   Procedure: LEFT HEART CATHETERIZATION WITH CORONARY ANGIOGRAM;  Surgeon: Odie Benne, MD;  Location: [facility] CATH LAB;  Service: Cardiovascular;  Laterality: N/A;   LIPOMA EXCISION     back of the head   NM MYOCAR PERF WALL MOTION  02/2012   lexiscan myoview; mild perfusion defect in mid inferolateral & basal inferolateral region (infarct/scar); EF 52%, abnormal but ow risk scan   PERCUTANEOUS CORONARY STENT INTERVENTION (PCI-S)  09/05/2014   Procedure: PERCUTANEOUS CORONARY STENT INTERVENTION (PCI-S);  Surgeon: Odie Benne, MD;  Location: [facility] CATH LAB;  Service: Cardiovascular;;   TONSILLECTOMY     Patient Active Problem List   Diagnosis Date Noted   Scrotal pain, right 08/26/2023   Epididymitis, right 08/26/2023   Complex renal cyst, right, 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/26/2023   Impingement syndrome of right shoulder 07/01/2023   Pain in right shoulder 06/16/2023   Anxiety disorder, unspecified 02/16/2023   Alcohol  use disorder 02/16/2023   Sleep disorder, unspecified 02/16/2023   Alcohol  abuse 02/15/2023   Alcohol  dependence ([name]) 05/16/2022   Low energy 03/06/2022   Moderate alcohol  dependence in early remission ([name])  02/24/2022   Suicidal ideation 07/07/2021   Generalized anxiety disorder 07/07/2021   Other allergic rhinitis 09/09/2020   Adverse reaction to food, subsequent encounter 09/09/2020   History of bee sting allergy 09/09/2020   Alcohol  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) 03/19/2020   MDD (major depressive disorder), recurrent episode, severe ([name]) 03/18/2020   History of substance abuse ([name]) 01/04/2020   Severe 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 features ([name]) 12/10/2019   Tinea cruris 07/06/2019   Hemoglobin A1c less than 7.0% 07/06/2019   Muscle strain of wrist 04/05/2019   Pain in both wrists 04/05/2019   Anxiety 04/05/2019   Prediabetes 04/05/2019   MDD (major depressive disorder) 01/12/2019   Chest pain 10/25/2018   MDD (major depressive disorder), recurrent severe, without psychosis ([name]) 08/15/2018   Influenza A 07/17/2017   Lactic acidosis    Cough    Nausea and vomiting    Severe episode of recurrent major depressive disorder, without psychotic features ([name])    MDD (major depressive disorder), single episode, severe with psychotic features ([name]) 06/27/2017   MDD (major depressive disorder), severe ([name]) 06/23/2017  Essential hypertension 01/13/2016   Unstable angina [facility])    ED (erectile dysfunction) 02/15/2014   Atypical chest pain 12/14/2011   Allergy to contrast media (used for diagnostic x-rays) 05/12/2011   Dyslipidemia, goal LDL below 70 05/09/2011   DM2 (diabetes mellitus, type 2) ([name]) 07/10/2010   CAD S/P percutaneous coronary angioplasty 07/10/2010    PCP: Abbey Hobby NP   REFERRING PROVIDER: Persons, [facility]   REFERRING DIAG: Rt shoulder pain , impingement   THERAPY DIAG:  No diagnosis found.  Rationale for Evaluation and Treatment: Rehabilitation  ONSET DATE: 2-3 mos   SUBJECTIVE:                                                                                                                                                                                       SUBJECTIVE STATEMENT: Patient had an episode of back pain.  Shoulder is ,   I am having less pain with activities.    Re-eval: Re- injured his Rt UE.  It feels fine now.  He slipped and caught his fall while moving. Haven't done much exercises since that time.  He no longer takes meds for it. He has missed some appts due to scheduling conflicts.     Eval: Patient presents with right-sided shoulder pain of less than 3 months duration.  He reports pain initially started as he was seated and working on his computer reaching across his body multiple times.  The pain wakes him up at night.  He reports it is hard to lift and rack his bike, complete ADLs bathing dressing and hygiene without increased shoulder pain.  He has not had physical therapy for the shoulder before and has never had surgery.  The right arm feels weak functionally but he does not drop items or have any weakness in his grip.  His pain does not radiate.  Recently had an injection posterior right shoulder but he reports no improvement or change. Hand dominance: Right  PERTINENT HISTORY: MI, multiple cardiac catheterizations  PAIN:  Are you having pain? Yes: NPRS scale: none at rest, can still be severe with lifting  Pain location: Rt  lateral shoulder  Pain description: sharp, achy  Aggravating factors: using it  Relieving factors: rest, ice pack/hot shower   PRECAUTIONS: None  RED FLAGS: None   WEIGHT BEARING RESTRICTIONS: No  FALLS:  Has patient fallen in last 6 months? 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  DIAGNOSTIC FINDINGS:  None  PATIENT SURVEYS:  FOTO 53%-Deferred   COGNITION: Overall cognitive status: Within functional limits for tasks assessed     SENSATION: WFL  POSTURE: Good sitting posture without cues   UPPER EXTREMITY MMT   MMT Right eval Left eval Rt.  09/09/23 Rt.  10/28/23  Shoulder flexion 4+ pain  5 4+ pain 4+ pain   Shoulder extension      Shoulder abduction 4+ pain  5 4+ pain  4+ pain   Shoulder adduction      Shoulder internal rotation 5   4+ pain 5/5  Shoulder external rotation 5 pain   4+ pain  5/5  Elbow flexion      Elbow extension      Wrist flexion      Wrist extension      Wrist ulnar deviation      Wrist radial deviation      Wrist pronation      Wrist supination      (Blank rows = not tested)  UPPER EXTREMITY ROM   AROM  Right eval Left eval Rt. 09/09/23 Rt 09/22/23 Rt. 10/28/23 RT 11/11/23 RT 11/18/23  Shoulder flexion 165  143 150 150 160   Shoulder extension 160        Shoulder abduction   125 110 130 145 Pain 145 pain  Shoulder adduction         Shoulder internal rotation Functional reach toT 7  L1  Upper lumbar     Shoulder external rotation Functional reach to T3, full in supine with arm at 90 deg abd  T2  T2    Grip strength (lbs) 83 pounds 79 pounds   Rt. 75 Lt. 80     (Blank rows = not tested)  SHOULDER SPECIAL TESTS: Impingement tests: Neer impingement test: positive  Pain with cross body adduction  JOINT MOBILITY TESTING:  Generalized joint stiffness in all directions of right glenohumeral motion   PALPATION:  Min TTP to the right anterolateral aspect of shoulder, no crepitus                                                                                                                              TREATMENT DATE:   [facility] Adult PT Treatment:                                                DATE: 12/01/23 Therapeutic Exercise: *** Manual Therapy: *** Neuromuscular  re-ed: *** Therapeutic Activity: *** Modalities: *** Self Care: [facility] Adult PT Treatment:  DATE: 11/18/23 Therapeutic Exercise: Standing GTB ER bilat 10 x 3 OH press using both arms 10# 10 x 3 Standing Star Pattern alternating GTB 2 x 10 Standing  lateral raise thumb up 2# 10 x 2   Therapeutic Activity: UBE 4 min Level 2  Seated Row 25# 15 x 2  Seated Lat pull 25# 15 x 2 Wall angels  2 x 10     [facility] Adult PT Treatment:                                                DATE: 11/11/23 Therapeutic Exercise: Standing GTB ER bilat 10 x 2  OH press using both arms 10# 10 x 2  Standing Star Pattern alternating GTB 2 x 10 Side lying shoulder abduction 2# Updated HEP   Therapeutic Activity: UBE Shoulder pulley scaption Wall angels x 10  UE closed chain protraction/ retraction x 10  Wall slides AAROM thumb up, palm down  Supine dowell pullovers     [facility] Adult PT Treatment:                                                DATE: 10/28/23 UBE each direction level 1 , for 5 min  Measure AROM and MMT  Grip  Standing AAROM flexion with dowel Abduction, extension, IR DASH    [facility] Adult PT Treatment:                                                DATE: 09/22/23 Therapeutic Exercise: Supine chest pres  Supine pullover  Supine protraction  Supine horiz abdct /addct with dowel Supine ER blue band 10 x 2  Supine horiz abdct Blue 10 x 2  Supine right Diagonal GTB, Red TB Side lying shoulder abduction, flexion, horizontal abduction Right shoulder PROM Updated HEP Manual Therapy: Right shoulder distraction, inferior and A/P glides     Education details: Plan of care, impingement versus tendinitis, posture rotator cuff and scapula model, home program Person educated: Patient Education method: Explanation, Demonstration, Verbal cues, and Handouts Education comprehension: verbalized understanding and needs further education  HOME  EXERCISE PROGRAM: Access Code: [email or web address] URL: [email or web address] Date: 07/15/2023 Prepared by: Marci Setter  Exercises - Shoulder External Rotation and Scapular Retraction with Resistance  - 1-2 x daily - 7 x weekly - 2 sets - 10 reps - 5 hold - Sidelying Shoulder Abduction with Resistance to 60 Degrees  - 1-2 x daily - 7 x weekly - 2 sets - 10 reps - 5 hold - Standing Shoulder Horizontal Abduction with Resistance  - 1-2 x daily - 7 x weekly - 2 sets - 10 reps - 5 hold -Open book and corner - AAROM   09/22/23 - Supine PNF D2 Flexion with Resistance  - 1 x daily - 7 x weekly - 2 sets - 10 reps 11/11/23 - Wall Angels  - 1 x daily - 7 x weekly - 2 sets - 10 reps - Standing Overhead Press with Dumbbells at [employer]  - 1 x daily - 7 x weekly - 2-3 sets - 10  reps - Alternating star pattern  - 1 x daily - 7 x weekly - 1-2 sets - 10 reps  ASSESSMENT:  CLINICAL IMPRESSION: Pt reports he sees improvement in shoulder, noting more activities that no longer cause pain in shoulder . Continues with min pain at end range AROM for shoulder abduction Progressed with scapular strength and OH lifting with good tolerance. Able to increase reps/resistance today without increased pain. No changes to HEP.    RE-eval: Pt has not been in PT for several weeks.  He unfortunately re-injured it as he nearly fell.  He did OH press yesterday and he noted it was OK, this was the first time since he re-injured it. He has a bit of pain with reaching out and overhead which he hopes he can ameliorate with a few more sessions of PT.  He was encouraged to cont with HEP and avoid heavy lifting with OH lifting. HE will benefit from skilled PT for 4-6 more visits to full address functional limitations.     Re-eval: Patient was renewed today for more visits.  He has completed only 4 visits due to other health issues. He continues to have min to no pain at rest.  Pain can be sharp and severe when lifting heavy items.   AROM is actually a bit less than when he began. Refocused on shoulder mobility and fully reviewed his HEP.  He will continue to benefit from skilled PT to restore pain free motion and strength.    OBJECTIVE IMPAIRMENTS: decreased mobility, decreased ROM, decreased strength, increased fascial restrictions, impaired flexibility, impaired UE functional use, and pain.   ACTIVITY LIMITATIONS: carrying, lifting, sleeping, reach over head, and hygiene/grooming  PARTICIPATION LIMITATIONS: interpersonal relationship and community activity  PERSONAL FACTORS: Past/current experiences and 1 comorbidity: Cardiac issues are also affecting patient's functional outcome.   REHAB POTENTIAL: Excellent  CLINICAL DECISION MAKING: Unstable/unpredictable  EVALUATION COMPLEXITY: Low   GOALS: Goals reviewed with patient? Yes  LONG TERM GOALS: Target date: 12/09/2023  Patient will be independent with home exercise program for right upper extremity Baseline: cont to work on this  Goal status: ongoing   2.  Patient will increase right shoulder strength to 5 out of 5 in all planes of motion without increased pain Baseline: 4+/5 with  min pain  Goal status: ongoing, 10/28/23  3.  Patient will be able to rack his bike and city bus without increased pain 75% of the time Baseline:  Goal status: MET   4. DASH score will improve to 35% or less  in order to demonstrate improved functional mobility Baseline: 43% Goal status: NEW    5. Pt will be able to demo shoulder AROM without pain in all directions    Baseline:see above     Min pain with flexion and abduction can 5/10 but it stops when he stops.    11/11/23: pain with abduction   11/18/23: "   Goal status: ongoing   PLAN:   PT FREQUENCY: 1x/week  PT DURATION: 6 weeks  PLANNED INTERVENTIONS: 97164- PT Re-evaluation, 97110-Therapeutic exercises, 97530- Therapeutic activity, 97112- Neuromuscular re-education, 97535- Self Care, 82956- Manual therapy,  Taping, Dry Needling, Joint mobilization, Cryotherapy, and Moist heat  PLAN FOR NEXT SESSION: shoulder mobilty, (AC joint OA )posture, general spine and shoulder mobility, review lifting technique   Gasper Karst, PTA 11/30/23 3:50 PM Phone: [phone] Fax: [phone]   For all possible CPT codes, reference the Planned Interventions line above.     Check all conditions that  are expected to impact treatment: {Conditions expected to impact treatment:None of these apply   If treatment provided at initial evaluation, no treatment charged due to lack of authorization.

## 2023-12-01 ENCOUNTER — Ambulatory Visit: Payer: MEDICAID | Admitting: Physical Therapy

## 2023-12-09 ENCOUNTER — Encounter: Payer: MEDICAID | Admitting: Physical Therapy

## 2023-12-10 ENCOUNTER — Encounter (HOSPITAL_COMMUNITY): Payer: Self-pay

## 2023-12-10 ENCOUNTER — Ambulatory Visit (HOSPITAL_COMMUNITY)
Admission: RE | Admit: 2023-12-10 | Discharge: 2023-12-10 | Disposition: A | Discharge status: Home / Self Care | Payer: MEDICAID | Source: Ambulatory Visit | Attending: Internal Medicine | Admitting: Internal Medicine

## 2023-12-10 VITALS — BP 135/93 | HR 60 | Temp 98.2°F | Resp 15

## 2023-12-10 DIAGNOSIS — J0100 Acute maxillary sinusitis, unspecified: Secondary | ICD-10-CM | POA: Diagnosis not present

## 2023-12-10 MED ORDER — FLUTICASONE PROPIONATE 50 MCG/ACT NA SUSP: 2.0000 | Freq: Every day | NASAL | 2 refills | Status: DC

## 2023-12-10 MED ORDER — AMOXICILLIN-POT CLAVULANATE 875-125 MG PO TABS: 1.0000 | ORAL_TABLET | Freq: Two times a day (BID) | ORAL | 0 refills | Status: AC

## 2023-12-10 NOTE — Discharge Instructions (Addendum)
 Symptoms, physical exam findings and duration of symptoms are consistent with an acute maxillary sinusitis. We will treat this with the following:  Amoxicillin  875 mg twice daily for 7 days.  This is an antibiotic.  Take this with food. Flonase  2 sprays each nostril once daily for 7 days for nasal congestion.  May use as needed after this. Rest as needed and stay hydrated.   Tylenol  or ibuprofen  for fevers/chills or pain. Return to urgent care or PCP if symptoms worsen or fail to resolve.

## 2023-12-10 NOTE — ED Triage Notes (Signed)
 Pt reports for 4 days had cough that is productive, congestion, sneezing, itchy eyes. Tried OTC medication that is not helping.

## 2023-12-10 NOTE — ED Provider Notes (Signed)
 [facility]    CSN: [id number] Arrival date & time: 12/10/23  1213      History   Chief Complaint Chief Complaint  Patient presents with   appt 1230    HPI [name] is a 63 y.o. male.   63 year old male who presents urgent care with complaints of cough, congestion, body aches, fevers and chills.  He has had congestion for about a week now but has developed cough, body aches, fevers and chills within the last 3 to 4 days.  He has been taking a decongestion medication over-the-counter and tramadol .  He denies nausea, vomiting, diarrhea, shortness of breath, headaches, chest pain.  He is eating and drinking normally.  He denies any sick contacts but does ride the bus and go to the gym frequently.  His cough is productive at times with yellow-green and brown phlegm      Past Medical History:  Diagnosis Date   Alcohol  dependence ([name])    Allergies    Arthritis    Chest pain    Chronic lower back pain    Chronic pain of right wrist    Cocaine abuse ([name])    per note on 01/18/23   Coronary artery disease    a. Multiple prior caths/PCI. Cath 2013 with possible spasm of RCA, 70% ISR of mid LCx with subsequent DES to mLCx and prox LCX. b. H/o microvascular angina. c. Recurrent angina 08/2014 - s/p PTCA/DES to prox Cx, PTCA/CBA to OM1.  c. LHC 06/10/15 with patent stents and some ISR in LCX and OM-1 that was not flow limiting --> Rx    Dyslipidemia    a. Intolerant to many statins except tolerating Livalo .   GERD (gastroesophageal reflux disease)    H/O cardiac catheterization 10/25/2018   Heart attack [facility])    Hypertension    Myocardial infarction [facility]) ~ 2010   Renal mass    S/P angioplasty with stent, DES, to proximal and mid LCX 12/15/11 12/15/2011   S/P foot surgery, right 04/2021   Shoulder pain    Stroke [facility])    pt. reports had a stroke around time of MI 2010   Type II diabetes mellitus ([name])    Unstable angina [facility])     Patient Active Problem List    Diagnosis Date Noted   Scrotal pain, right 08/26/2023   Epididymitis, right 08/26/2023   Complex renal cyst, right, 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/26/2023   Impingement syndrome of right shoulder 07/01/2023   Pain in right shoulder 06/16/2023   Anxiety disorder, unspecified 02/16/2023   Alcohol  use disorder 02/16/2023   Sleep disorder, unspecified 02/16/2023   Alcohol  abuse 02/15/2023   Alcohol  dependence ([name]) 05/16/2022   Low energy 03/06/2022   Moderate alcohol  dependence in early remission ([name]) 02/24/2022   Suicidal ideation 07/07/2021   Generalized anxiety disorder 07/07/2021   Other allergic rhinitis 09/09/2020   Adverse reaction to food, subsequent encounter 09/09/2020   History of bee sting allergy 09/09/2020   Alcohol  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) 03/19/2020   MDD (major depressive disorder), recurrent episode, severe ([name]) 03/18/2020   History of substance abuse ([name]) 01/04/2020   Severe 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 features ([name]) 12/10/2019   Tinea cruris 07/06/2019   Hemoglobin A1c less than 7.0% 07/06/2019   Muscle strain of wrist 04/05/2019   Pain in both wrists 04/05/2019   Anxiety 04/05/2019   Prediabetes 04/05/2019   MDD (major depressive disorder) 01/12/2019   Chest pain 10/25/2018   MDD (  major depressive disorder), recurrent severe, without psychosis ([name]) 08/15/2018   Influenza A 07/17/2017   Lactic acidosis    Cough    Nausea and vomiting    Severe episode of recurrent major depressive disorder, without psychotic features ([name])    MDD (major depressive disorder), single episode, severe with psychotic features ([name]) 06/27/2017   MDD (major depressive disorder), severe ([name]) 06/23/2017   Essential hypertension 01/13/2016   Unstable angina ([name])    ED (erectile dysfunction) 02/15/2014   Atypical chest pain 12/14/2011   Allergy to contrast media (used for diagnostic x-rays) 05/12/2011   Dyslipidemia, goal LDL below 70 05/09/2011    DM2 (diabetes mellitus, type 2) ([name]) 07/10/2010   CAD S/P percutaneous coronary angioplasty 07/10/2010    Past Surgical History:  Procedure Laterality Date   CARDIAC CATHETERIZATION  06/15/2002   LAD with prox 40% stenosis, norma L main, Cfx with 25% lesion, RCA with long mid 25% stenosis (Dr. Arlene Lacy)   CARDIAC CATHETERIZATION  04/01/2010   normal L main, LAD wit mild stenosis, L Cfx with 70% in-stent restenosis, RCA with 70% in-stent restenosis, LVEF >60% (Dr. K. Italy Hilty) - cutting ballon arthrectomy to RCA & Cfx (Dr. Rande Bushy)   CARDIAC CATHETERIZATION  08/25/2010   preserved global LV contractility; multivessel CAD, diffuse 90-95% in-stent restenosis in prox placed Cfx stent - cutting balloon arthrectomy in Cfx with multiple dilatations 90-95% to 0% stenosis (Dr. Electa Grieve)   CARDIAC CATHETERIZATION  01/26/2011   PCI & stenting of aggresive in-stent restenosis within previously stented AV groove Cfx with 3.0x23mm Taxus DES (previous stents were Promus) (Dr. Aleda Ammon)   CARDIAC CATHETERIZATION  05/11/2011   preserved LV function, 40% mid LAD stenosis, 30-40% narrowing proximal to stented semgnet of prox Cfx, patent mid RCA stent with smooth 20% narrowing in distal RCA (Dr. Electa Grieve)   CARDIAC CATHETERIZATION  12/15/2011   PCI & stenting of proximal & mid Cfx with DES - 3.0x68mm in proximal, 3.0x66mm in mid (Dr. Aleda Ammon)   CARDIAC CATHETERIZATION N/A 06/10/2015   Procedure: Left Heart Cath and Coronary Angiography;  Surgeon: Mardell Shade, MD;  Location: [facility] INVASIVE CV LAB;  Service: Cardiovascular;  Laterality: N/A;   CARDIAC CATHETERIZATION  10/25/2018   cardiometabolic testing  08/08/2012   good exercise effort, peak VO2 79% predicted with normal VO2 HR curves (mild deconditioning)   COLONOSCOPY  12/2012   diminutive hyperplastic sigmoid poyp so repeat routine 2024   CORONARY BALLOON ANGIOPLASTY N/A 10/25/2018   Procedure: CORONARY BALLOON ANGIOPLASTY;  Surgeon: Lucendia Rusk, MD;  [facility];  Service: Cardiovascular;  Laterality: N/A;   CORONARY BALLOON ANGIOPLASTY N/A 09/29/2019   Procedure: CORONARY BALLOON ANGIOPLASTY;  Surgeon: Sammy Crisp, MD;  [facility];  Service: Cardiovascular;  Laterality: N/A;   CORONARY ULTRASOUND/IVUS N/A 09/29/2019   Procedure: Intravascular Ultrasound/IVUS;  Surgeon: Sammy Crisp, MD;  [facility];  Service: Cardiovascular;  Laterality: N/A;   EXCISIONAL HEMORRHOIDECTOMY  1984   LEFT HEART CATH AND CORONARY ANGIOGRAPHY N/A 10/25/2018   Procedure: LEFT HEART CATH AND CORONARY ANGIOGRAPHY;  Surgeon: Lucendia Rusk, MD;  Location: [facility] INVASIVE CV LAB;  Service: Cardiovascular;  Laterality: N/A;   LEFT HEART CATH AND CORONARY ANGIOGRAPHY N/A 09/29/2019   Procedure: LEFT HEART CATH AND CORONARY ANGIOGRAPHY;  Surgeon: Sammy Crisp, MD;  [facility];  Service: Cardiovascular;  Laterality: N/A;   LEFT HEART CATHETERIZATION WITH CORONARY ANGIOGRAM N/A 05/11/2011  Procedure: LEFT HEART CATHETERIZATION WITH CORONARY ANGIOGRAM;  Surgeon: Millicent Ally, MD;  Location: [facility] CATH LAB;  Service: Cardiovascular;  Laterality: N/A;  Possible percutaneous coronary intervention, possible IVUS   LEFT HEART CATHETERIZATION WITH CORONARY ANGIOGRAM N/A 12/15/2011   Procedure: LEFT HEART CATHETERIZATION WITH CORONARY ANGIOGRAM;  Surgeon: Avanell Leigh, MD;  Location: [facility] CATH LAB;  Service: Cardiovascular;  Laterality: N/A;   LEFT HEART CATHETERIZATION WITH CORONARY ANGIOGRAM N/A 09/05/2014   Procedure: LEFT HEART CATHETERIZATION WITH CORONARY ANGIOGRAM;  Surgeon: Odie Benne, MD;  Location: [facility] CATH LAB;  Service: Cardiovascular;  Laterality: N/A;   LIPOMA EXCISION     back of the head   NM MYOCAR PERF WALL MOTION  02/2012   lexiscan myoview; mild perfusion defect in mid inferolateral & basal inferolateral region (infarct/scar); EF 52%, abnormal but ow risk scan    PERCUTANEOUS CORONARY STENT INTERVENTION (PCI-S)  09/05/2014   Procedure: PERCUTANEOUS CORONARY STENT INTERVENTION (PCI-S);  Surgeon: Odie Benne, MD;  Location: [facility] CATH LAB;  Service: Cardiovascular;;   TONSILLECTOMY         Home Medications    Prior to Admission medications   Medication Sig Start Date End Date Taking? Authorizing Provider  fluticasone  (FLONASE ) 50 MCG/ACT nasal spray Place 2 sprays into both nostrils daily. 12/10/23  Yes Syrita Dovel A, PA-C  ACCU-CHEK GUIDE TEST test strip USE 1 STRIP TO CHECK GLUCOSE THREE TIMES DAILY AS DIRECTED 10/13/23   Jerrlyn Morel, NP  amLODipine  (NORVASC ) 10 MG tablet Take 1 tablet (10 mg total) by mouth daily. 05/20/23   Jerrlyn Morel, NP  amoxicillin -clavulanate (AUGMENTIN ) 875-125 MG tablet Take 1 tablet by mouth every 12 (twelve) hours for 7 days. 12/10/23 12/17/23 Yes Keyon Winnick, Marjory Signs, PA-C  aspirin  EC 81 MG tablet Take by mouth.    [provider]  Cholecalciferol  25 MCG (1000 UT) tablet Take 1,000 Units by mouth daily.    [provider]  clopidogrel  (PLAVIX ) 75 MG tablet Take 1 tablet by mouth once daily 10/29/23   Nichols, Tonya S, NP  diphenhydrAMINE  (BENADRYL ) 50 MG tablet Take 1 tablet by mouth 1 hour before MRI 08/27/23   Stoneking, Ponce Brisker., MD  EPINEPHrine  0.3 mg/0.3 mL IJ SOAJ injection Inject 0.3 mg into the muscle as needed for anaphylaxis. 05/07/21   Sean Czar, MD  ezetimibe  (ZETIA ) 10 MG tablet Take 1 tablet (10 mg total) by mouth daily. 05/20/23   Jerrlyn Morel, NP  LIVALO  4 MG TABS Take 1 tablet by mouth once daily 04/07/23   Hilty, Aviva Lemmings, MD  meloxicam  (MOBIC ) 15 MG tablet Take 1 tablet (15 mg total) by mouth daily. 09/23/23   Stoneking, Ponce Brisker., MD  metFORMIN  (GLUCOPHAGE -XR) 500 MG 24 hr tablet Take 1 tablet (500 mg total) by mouth daily with breakfast. 05/20/23 08/18/23  Nichols, Tonya S, NP  metoprolol  tartrate (LOPRESSOR ) 100 MG tablet Take 1 tablet (100 mg total) by mouth  2 (two) times daily. NEED OV. 05/20/23   Jerrlyn Morel, NP  Multiple Vitamin (MULTIVITAMIN WITH MINERALS) TABS tablet Take 1 tablet by mouth daily. 08/13/22   Augusta Blizzard, MD  nitroGLYCERIN  (NITROSTAT ) 0.4 MG SL tablet Place 1 tablet (0.4 mg total) under the tongue every 5 (five) minutes x 3 doses as needed for chest pain. 07/18/21   Hilty, Aviva Lemmings, MD  Polyethyl Glycol-Propyl Glycol (SYSTANE) 0.4-0.3 % GEL ophthalmic gel Place 1 Application into both eyes daily as needed (For dry eyes).  [provider]    Family History Family History  Problem Relation Age of Onset   Leukemia Mother    Prostate cancer Father    Cancer Brother    Coronary artery disease Paternal Grandmother    Cancer Paternal Grandfather    Migraines Neg Hx    Headache Neg Hx    Colon cancer Neg Hx    Esophageal cancer Neg Hx    Rectal cancer Neg Hx    Stomach cancer Neg Hx     Social History Social History   Tobacco Use   Smoking status: Former    Current packs/day: 0.00    Average packs/day: 1 pack/day for 10.0 years (10.0 ttl pk-yrs)    Types: Cigarettes    Start date: 10/06/2008    Quit date: 10/07/2018    Years since quitting: 5.1   Smokeless tobacco: Never  Vaping Use   Vaping status: Never Used  Substance Use Topics   Alcohol  use: Not Currently    Comment: 05/28/2023-stopped drinking 22 days ago.   Drug use: Not Currently    Types: Cocaine     Allergies   Bee venom, Gadolinium, Shellfish allergy, Statins, and Testosterone  cypionate   Review of Systems Review of Systems  Constitutional:  Positive for chills and fever.  HENT:  Positive for congestion and sneezing. Negative for ear pain and sore throat.   Eyes:  Positive for itching. Negative for pain and visual disturbance.  Respiratory:  Positive for cough. Negative for shortness of breath.   Cardiovascular:  Negative for chest pain and palpitations.  Gastrointestinal:  Negative for abdominal pain and vomiting.  Genitourinary:   Negative for dysuria and hematuria.  Musculoskeletal:  Negative for arthralgias and back pain.  Skin:  Negative for color change and rash.  Neurological:  Negative for seizures and syncope.  All other systems reviewed and are negative.    Physical Exam Triage Vital Signs ED Triage Vitals  Encounter Vitals Group     BP 12/10/23 1227 (!) 135/93     Girls Systolic BP Percentile --      Girls Diastolic BP Percentile --      Boys Systolic BP Percentile --      Boys Diastolic BP Percentile --      Pulse Rate 12/10/23 1227 60     Resp 12/10/23 1227 15     Temp 12/10/23 1227 98.2 F (36.8 C)     Temp Source 12/10/23 1227 Oral     SpO2 12/10/23 1227 98 %     Weight --      Height --      Head Circumference --      Peak Flow --      Pain Score 12/10/23 1226 0     Pain Loc --      Pain Education --      Exclude from Growth Chart --    No data found.  Updated Vital Signs BP (!) 135/93 (BP Location: Left Arm)   Pulse 60   Temp 98.2 F (36.8 C) (Oral)   Resp 15   SpO2 98%   Visual Acuity Right Eye Distance:   Left Eye Distance:   Bilateral Distance:    Right Eye Near:   Left Eye Near:    Bilateral Near:     Physical Exam Vitals and nursing note reviewed.  Constitutional:      General: He is not in acute distress.    Appearance: He is well-developed.  HENT:  Head: Normocephalic and atraumatic.     Right Ear: Tympanic membrane normal.     Left Ear: Tympanic membrane normal.     Nose: Nasal tenderness, mucosal edema and congestion present.     Right Sinus: Maxillary sinus tenderness and frontal sinus tenderness present.     Left Sinus: Maxillary sinus tenderness and frontal sinus tenderness present.     Comments: Mild tenderness in the frontal and maxillary sinus bilateral    Mouth/Throat:     Mouth: Mucous membranes are moist.   Eyes:     Conjunctiva/sclera: Conjunctivae normal.    Cardiovascular:     Rate and Rhythm: Normal rate and regular rhythm.      Heart sounds: No murmur heard. Pulmonary:     Effort: Pulmonary effort is normal. No respiratory distress.     Breath sounds: Normal breath sounds.  Abdominal:     Palpations: Abdomen is soft.     Tenderness: There is no abdominal tenderness.   Musculoskeletal:        General: No swelling.     Cervical back: Neck supple.   Skin:    General: Skin is warm and dry.     Capillary Refill: Capillary refill takes less than 2 seconds.   Neurological:     Mental Status: He is alert.   Psychiatric:        Mood and Affect: Mood normal.      UC Treatments / Results  Labs (all labs ordered are listed, but only abnormal results are displayed) Labs Reviewed - No data to display  EKG   Radiology No results found.  Procedures Procedures (including critical care time)  Medications Ordered in UC Medications - No data to display  Initial Impression / Assessment and Plan / UC Course  I have reviewed the triage vital signs and the nursing notes.  Pertinent labs & imaging results that were available during my care of the patient were reviewed by me and considered in my medical decision making (see chart for details).     Acute non-recurrent maxillary sinusitis   Symptoms, physical exam findings and duration of symptoms are consistent with an acute maxillary sinusitis. The patient has developed subjective fevers and chills but is afebrile here. He has had worsening symptoms despite OTC treatment and has had congestion for almost a week now. We will treat this with the following:  Amoxicillin  875 mg twice daily for 7 days.  This is an antibiotic.  Take this with food. Flonase  2 sprays each nostril once daily for 7 days for nasal congestion.  May use as needed after this. Rest as needed and stay hydrated.   Tylenol  or ibuprofen  for fevers/chills or pain. Return to urgent care or PCP if symptoms worsen or fail to resolve.    Final Clinical Impressions(s) / UC Diagnoses   Final  diagnoses:  Acute non-recurrent maxillary sinusitis     Discharge Instructions      Symptoms, physical exam findings and duration of symptoms are consistent with an acute maxillary sinusitis. We will treat this with the following:  Amoxicillin  875 mg twice daily for 7 days.  This is an antibiotic.  Take this with food. Flonase  2 sprays each nostril once daily for 7 days for nasal congestion.  May use as needed after this. Rest as needed and stay hydrated.   Tylenol  or ibuprofen  for fevers/chills or pain. Return to urgent care or PCP if symptoms worsen or fail to resolve.  ED Prescriptions     Medication Sig Dispense Auth. Provider   amoxicillin -clavulanate (AUGMENTIN ) 875-125 MG tablet Take 1 tablet by mouth every 12 (twelve) hours for 7 days. 14 tablet Graeden Bitner A, PA-C   fluticasone  (FLONASE ) 50 MCG/ACT nasal spray Place 2 sprays into both nostrils daily. 9.9 mL Kreg Pesa, [place]      PDMP not reviewed this encounter.   Kreg Pesa, [place] 12/10/23 1303

## 2023-12-13 ENCOUNTER — Ambulatory Visit (HOSPITAL_COMMUNITY): Payer: MEDICAID | Admitting: Medical

## 2023-12-13 ENCOUNTER — Encounter (HOSPITAL_COMMUNITY): Payer: Self-pay

## 2023-12-13 NOTE — Progress Notes (Deleted)
    OFFICE NOTE:    Date:  12/13/2023  ID:  [name], DOB 02-27-1961, MRN [id number] PCP: Jerrlyn Morel, NP  [facility] Providers Cardiologist:  Hazle Lites, MD { Click to update primary MD,subspecialty MD or APP then REFRESH:1}      Patient Profile:  Coronary artery disease  S/p multiple PCIs, in stent restenosis S/p POBA to LCx (ISR), OM2 (ISR) in 09/2018 S/p POBA to LCx, OM2 09/2019 LHC 09/29/2019: LAD mid 60 (improved to 25 with IC nitroglycerin ); LCx proximal 70 ISR, mid 50 ISR, 70, OM2 50 ISR, 95 ISR; RCA irregularities, mid stent patent  TTE 09/29/2019: GR 1 DD, mild LVH, EF 55-60, no RWMA, normal RVSF, trivial MR CMD (coronary microvascular disease) Hypertension  Hyperlipidemia  Diabetes mellitus  ETOH abuse       Discussed the use of AI scribe software for clinical note transcription with the patient, who gave verbal consent to proceed. History of Present Illness [name] is a 63 y.o. male who returns for follow up of CAD. He was last seen in clinic by Ervin Heath, PA-C in 02/2023.     ROS-See HPI***    Studies Reviewed:      *** Results  Risk Assessment/Calculations: {Does this patient have ATRIAL FIBRILLATION?:[phone]} No BP recorded.  {Refresh Note OR Click here to enter BP  :1}***      Physical Exam:  VS:  There were no vitals taken for this visit.   Wt Readings from Last 3 Encounters:  09/23/23 195 lb (88.5 kg)  08/27/23 195 lb 6.4 oz (88.6 kg)  08/26/23 195 lb (88.5 kg)    Physical Exam***     Assessment and Plan:    Assessment & Plan Coronary artery disease involving native coronary artery of native heart without angina pectoris  Essential hypertension  Dyslipidemia, goal LDL below 70  Assessment and Plan Assessment & Plan    {      :1}    {Are you ordering a CV Procedure (e.g. stress test, cath, DCCV, TEE, etc)?   Press F2        :[id number]}  Dispo:  No follow-ups on file.  Signed, Marlyse Single, PA-C

## 2023-12-14 ENCOUNTER — Ambulatory Visit: Payer: MEDICAID | Admitting: Physician Assistant

## 2023-12-14 DIAGNOSIS — I10 Essential (primary) hypertension: Secondary | ICD-10-CM

## 2023-12-14 DIAGNOSIS — E785 Hyperlipidemia, unspecified: Secondary | ICD-10-CM

## 2023-12-14 DIAGNOSIS — I2510 Atherosclerotic heart disease of native coronary artery without angina pectoris: Secondary | ICD-10-CM

## 2023-12-23 ENCOUNTER — Ambulatory Visit (INDEPENDENT_AMBULATORY_CARE_PROVIDER_SITE_OTHER): Payer: MEDICAID | Admitting: Nurse Practitioner

## 2023-12-23 ENCOUNTER — Encounter (HOSPITAL_COMMUNITY): Payer: Self-pay | Admitting: Medical

## 2023-12-23 ENCOUNTER — Telehealth (HOSPITAL_BASED_OUTPATIENT_CLINIC_OR_DEPARTMENT_OTHER): Payer: MEDICAID | Admitting: Medical

## 2023-12-23 ENCOUNTER — Encounter: Payer: Self-pay | Admitting: Nurse Practitioner

## 2023-12-23 ENCOUNTER — Telehealth: Payer: Self-pay | Admitting: Pharmacy Technician

## 2023-12-23 ENCOUNTER — Other Ambulatory Visit (HOSPITAL_COMMUNITY): Payer: Self-pay

## 2023-12-23 VITALS — BP 126/90 | HR 64 | Temp 97.7°F | Wt 192.2 lb

## 2023-12-23 DIAGNOSIS — D4101 Neoplasm of uncertain behavior of right kidney: Secondary | ICD-10-CM

## 2023-12-23 DIAGNOSIS — F17210 Nicotine dependence, cigarettes, uncomplicated: Secondary | ICD-10-CM

## 2023-12-23 DIAGNOSIS — F1021 Alcohol dependence, in remission: Secondary | ICD-10-CM

## 2023-12-23 DIAGNOSIS — F1421 Cocaine dependence, in remission: Secondary | ICD-10-CM

## 2023-12-23 DIAGNOSIS — Z13220 Encounter for screening for lipoid disorders: Secondary | ICD-10-CM | POA: Diagnosis not present

## 2023-12-23 DIAGNOSIS — M545 Low back pain, unspecified: Secondary | ICD-10-CM

## 2023-12-23 DIAGNOSIS — E1159 Type 2 diabetes mellitus with other circulatory complications: Secondary | ICD-10-CM

## 2023-12-23 DIAGNOSIS — Z125 Encounter for screening for malignant neoplasm of prostate: Secondary | ICD-10-CM | POA: Diagnosis not present

## 2023-12-23 DIAGNOSIS — F1994 Other psychoactive substance use, unspecified with psychoactive substance-induced mood disorder: Secondary | ICD-10-CM

## 2023-12-23 DIAGNOSIS — G8929 Other chronic pain: Secondary | ICD-10-CM

## 2023-12-23 DIAGNOSIS — H04123 Dry eye syndrome of bilateral lacrimal glands: Secondary | ICD-10-CM

## 2023-12-23 DIAGNOSIS — Z9861 Coronary angioplasty status: Secondary | ICD-10-CM

## 2023-12-23 DIAGNOSIS — D229 Melanocytic nevi, unspecified: Secondary | ICD-10-CM

## 2023-12-23 DIAGNOSIS — I252 Old myocardial infarction: Secondary | ICD-10-CM

## 2023-12-23 DIAGNOSIS — D4121 Neoplasm of uncertain behavior of right ureter: Secondary | ICD-10-CM

## 2023-12-23 DIAGNOSIS — I2510 Atherosclerotic heart disease of native coronary artery without angina pectoris: Secondary | ICD-10-CM

## 2023-12-23 DIAGNOSIS — I10 Essential (primary) hypertension: Secondary | ICD-10-CM

## 2023-12-23 DIAGNOSIS — E781 Pure hyperglyceridemia: Secondary | ICD-10-CM

## 2023-12-23 DIAGNOSIS — F17200 Nicotine dependence, unspecified, uncomplicated: Secondary | ICD-10-CM

## 2023-12-23 LAB — POCT GLYCOSYLATED HEMOGLOBIN (HGB A1C): Hemoglobin A1C: 7.2 % — AB (ref 4.0–5.6)

## 2023-12-23 MED ORDER — CYCLOBENZAPRINE HCL 10 MG PO TABS: 10.0000 mg | ORAL_TABLET | Freq: Three times a day (TID) | ORAL | 0 refills | Status: DC | PRN

## 2023-12-23 MED ORDER — METOPROLOL TARTRATE 50 MG PO TABS: 50.0000 mg | ORAL_TABLET | Freq: Two times a day (BID) | ORAL | 3 refills | Status: DC

## 2023-12-23 MED ORDER — BUSPIRONE HCL 10 MG PO TABS: 10.0000 mg | ORAL_TABLET | Freq: Three times a day (TID) | ORAL | 2 refills | Status: DC

## 2023-12-23 NOTE — Progress Notes (Signed)
 [facility] [place] after care until last week

## 2023-12-23 NOTE — Telephone Encounter (Signed)
 Pharmacy Patient Advocate Encounter  Received notification from [facility] that Prior Authorization for Livalo  has been APPROVED from 12/23/23 to 12/22/24. Ran test claim, Copay is $4.00- 3 months. This test claim was processed through [facility]- copay amounts may vary at other pharmacies due to pharmacy/plan contracts, or as the patient moves through the different stages of their insurance plan.   PA #/Case ID/Reference #: [id number]

## 2023-12-23 NOTE — Telephone Encounter (Signed)
 Pharmacy Patient Advocate Encounter   Received notification from Fax that prior authorization for Livalo  is required/requested.   Insurance verification completed.   The patient is insured through [facility] .   Per test claim: PA required; PA submitted to above mentioned insurance via CoverMyMeds Key/confirmation #/EOC [id number] Status is pending

## 2023-12-23 NOTE — Progress Notes (Signed)
 Patient ID: [name], male   DOB: August 29, 1960, 63 y.o.   MRN: [id number]

## 2023-12-23 NOTE — Progress Notes (Signed)
 BH MD/PA/NP OP Progress Note  12/23/2023 2:41 PM JLYNN LANGILLE  MRN:  [id number] Virtual Visit via Video Note  I connected with [name] on 12/23/23 at  1:30 PM EDT by a video enabled telemedicine application and verified that I am speaking with the correct person using two identifiers.  Location: Patient: [facility] Office Laptop Provider: [facility] OP Cher   I discussed the limitations of evaluation and management by telemedicine and the availability of in person appointments. The patient expressed understanding and agreed to proceed.   History of Present Illness:See EPIC note    Observations/Objective:See EPIC note   Assessment and Plan:See EPIC note   Follow Up Instructions:See EPIC note    I discussed the assessment and treatment plan with the patient. The patient was provided an opportunity to ask questions and all were answered. The patient agreed with the plan and demonstrated an understanding of the instructions.   The patient was advised to call back or seek an in-person evaluation if the symptoms worsen or if the condition fails to improve as anticipated.  I provided 35 minutes of non-face-to-face time during this encounter.   [name], PA-C   Chief Complaint:  Chief Complaint  Patient presents with   Follow-up   Medication Problem   Addiction Problem   Anxiety   ASCVD   Diabetes II   HPI: Elan returns for Medication management of new onset anxiety S/P discharge from [facility] 07/09/2023 :                        ;     [facility] Discharge Summary   Date of Admission: 05/19/2023 Referall Source:  TRANSITIONS OF CARE                                                                        Date of Discharge: 07/09/2023 Sobriety Date:05/04/2023   Plan of Action to Address Continuing Problems:  Goals and Activities to Help Maintain Sobriety: Stay away from people ,places and things that are triggers Continue practicing Fair Fighting rules in  interpersonal conflicts. Continue alcohol  and drug refusal skills and call on support system  Attend AA/NA meetings AT LEAST as often as you use  Obtain a sponsor and a home group in AA/NA. Return to providers as scheduled Referrals:  Aftercare:[facility] Medication management:NA Other:PDMP Testoterone  Next appointment: See Patient Station  Abad continued with Aftercare  with [facility] care until last week. He has not been on any Psychiatric medications except for Hydroxyzine  for anxiety in the past. He reports it was totally ineffective.He reached out to the IOP Counselor: His anxiety interferes with his ability to feel normal /comfortable while goig about his normal activities.He denies any feelings/sense of depression.  Patti Elsie SAILOR, LCSW  Social Worker CASE MANAGEMENT Telephone Encounter Signed Encounter Date:  11/29/2023   Signed      The therapist receives a voicemail from [place] saying that he started a new job recently and his anxiety has been out of control.  He is reportedly receiving therapy services at [facility] [place] but asks if he can get connected with the PA-C  from [facility] for medication management.   After consulting with Mr. [name], PA-C, the therapist reaches out to the receptionist to have her contact [place] to get him scheduled on Mr. Araceli Thursday afternoon scheduled.   Zell Maier, MA, LCSW, [employer], LCAS 11/29/2023         Today, he says his anxiety is worse on awakening.He sdenies insomnia/nightmares/OSA.he does not complain about his new job today. Medications were reviewed and updated. He reports continuous sobriety with regular attendance at [employer] ,sponsorship and a good network of men in [place]. He had missed 2 previous appointments due respiratory illness.Says today is his first day out.                          Visit Diagnosis:    ICD-10-CM   1. Alcohol  use disorder, severe, in  early remission, dependence ([name])  F10.21     2. Cocaine use disorder, severe, in early remission, dependence ([name])  F14.21     3. Tobacco use disorder  F17.200     4. Substance induced mood disorder ([name])  F19.94     5. CAD S/P percutaneous coronary angioplasty  I25.10    Z98.61     6. History of myocardial infarction due to atherothrombotic coronary artery disease  I25.2    I25.10     7. Hypertriglyceridemia  E78.1     8. Type 2 diabetes mellitus with other circulatory complication, without long-term current use of insulin  ([name])  E11.59     9. Essential hypertension  I10     10. 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 Bilateral dry eyes  H04.123       Past Psychiatric History:  2024/25 CD IOP per HPI 2025 [facility] [place]  Past [facility] History:  [facility]   12/10/23  History              HPI ISAAK DELMUNDO is a 63 year old male who presents urgent care with complaints of cough, congestion, body aches, fevers and chills.  He has had congestion for about a week now but has developed cough, body aches, fevers and chills within the last 3 to 4 days.  He has been taking a decongestion medication over-the-counter and tramadol .  He denies nausea, vomiting, diarrhea, shortness of breath, headaches, chest pain.  He is eating and drinking normally.  He denies any sick contacts but does ride the bus and go to the gym frequently.  His cough is productive at times with yellow-green and brown phlegm  Care Everywhere Reviewed 11/23/2023 8:00 AM EDTOffice Visit [facility]  [address]  [address]  [place], [place] [address]  [phone]  Beryl Donnice BRAVO, MD  [address]  [address]  [place], [place] [address]  [phone] (Work)  [phone] (Fax)  Testosterone  deficiency (Primary Dx); Chronic fatigue; Type 2 diabetes mellitus without complication, without long-term current use  of insulin  (*)   Past Medical History:  Diagnosis Date   Alcohol  dependence ([name])    Allergies    Arthritis    Chest pain    Chronic lower back pain    Chronic pain of right wrist    Cocaine abuse ([name])    per note on 01/18/23   Coronary artery disease    a. Multiple prior caths/PCI. Cath 2013 with possible spasm of RCA, 70%  ISR of mid LCx with subsequent DES to mLCx and prox LCX. b. H/o microvascular angina. c. Recurrent angina 08/2014 - s/p PTCA/DES to prox Cx, PTCA/CBA to OM1.  c. LHC 06/10/15 with patent stents and some ISR in LCX and OM-1 that was not flow limiting --> Rx    Dyslipidemia    a. Intolerant to many statins except tolerating Livalo .   GERD (gastroesophageal reflux disease)    H/O cardiac catheterization 10/25/2018   Heart attack [facility])    Hypertension    Myocardial infarction [facility]) ~ 2010   Renal mass    S/P angioplasty with stent, DES, to proximal and mid LCX 12/15/11 12/15/2011   S/P foot surgery, right 04/2021   Shoulder pain    Stroke ([name])    pt. reports had a stroke around time of MI 2010   Type II diabetes mellitus ([name])    Unstable angina ([name])     Past Surgical History:  Procedure Laterality Date   CARDIAC CATHETERIZATION  06/15/2002   LAD with prox 40% stenosis, norma L main, Cfx with 25% lesion, RCA with long mid 25% stenosis (Dr. DOROTHA Schilling)   CARDIAC CATHETERIZATION  04/01/2010   normal L main, LAD wit mild stenosis, L Cfx with 70% in-stent restenosis, RCA with 70% in-stent restenosis, LVEF >60% (Dr. K. Italy Hilty) - cutting ballon arthrectomy to RCA & Cfx (Dr. RONAL Ly)   CARDIAC CATHETERIZATION  08/25/2010   preserved global LV contractility; multivessel CAD, diffuse 90-95% in-stent restenosis in prox placed Cfx stent - cutting balloon arthrectomy in Cfx with multiple dilatations 90-95% to 0% stenosis (Dr. IVAR Sor)   CARDIAC CATHETERIZATION  01/26/2011   PCI & stenting of aggresive in-stent restenosis within previously stented AV groove Cfx with  3.0x74mm Taxus DES (previous stents were Promus) (Dr. DOROTHA Lesches)   CARDIAC CATHETERIZATION  05/11/2011   preserved LV function, 40% mid LAD stenosis, 30-40% narrowing proximal to stented semgnet of prox Cfx, patent mid RCA stent with smooth 20% narrowing in distal RCA (Dr. IVAR Sor)   CARDIAC CATHETERIZATION  12/15/2011   PCI & stenting of proximal & mid Cfx with DES - 3.0x17mm in proximal, 3.0x68mm in mid (Dr. DOROTHA Lesches)   CARDIAC CATHETERIZATION N/A 06/10/2015   Procedure: Left Heart Cath and Coronary Angiography;  Surgeon: Toribio JONELLE Fuel, MD;  [facility] INVAS[facility] CV LAB;  Service: Cardiovascular;  Laterality: N/A;   CARDIAC CATHETERIZATION  10/25/2018   cardiometabolic testing  08/08/2012   good exercise effort, peak VO2 79% predicted with normal VO2 HR curves (mild deconditioning)   COLONOSCOPY  12/2012   diminutive hyperplastic sigmoid poyp so repeat routine 2024   CORONARY BALLOON ANGIOPLASTY N/A 10/25/2018   Procedure: CORONARY BALLOON ANGIOPLASTY;  Surgeon: Dann Candyce RAMAN, MD;  [facility];  Service: Cardiovascular;  Laterality: N/A;   CORONARY BALLOON ANGIOPLASTY N/A 09/29/2019   Procedure: CORONARY BALLOON ANGIOPLASTY;  Surgeon: Mady Bruckner, MD;  [facility];  Service: Cardiovascular;  Laterality: N/A;   CORONARY ULTRASOUND/IVUS N/A 09/29/2019   Procedure: Intravascular Ultrasound/IVUS;  Surgeon: Mady Bruckner, MD;  [facility];  Service: Cardiovascular;  Laterality: N/A;   EXCISIONAL HEMORRHOIDECTOMY  1984   LEFT HEART CATH AND CORONARY ANGIOGRAPHY N/A 10/25/2018   Procedure: LEFT HEART CATH AND CORONARY ANGIOGRAPHY;  Surgeon: Dann Candyce RAMAN, MD;  Location: [facility] INVASIVE CV LAB;  Service: Cardiovascular;  Laterality: N/A;   LEFT HEART CATH AND CORONARY ANGIOGRAPHY N/A 09/29/2019   Procedure: LEFT HEART  CATH AND CORONARY ANGIOGRAPHY;  Surgeon: Mady Bruckner, MD;  [facility];  Service: Cardiovascular;   Laterality: N/A;   LEFT HEART CATHETERIZATION WITH CORONARY ANGIOGRAM N/A 05/11/2011   Procedure: LEFT HEART CATHETERIZATION WITH CORONARY ANGIOGRAM;  Surgeon: Debby DELENA Sor, MD;  Location[facility] CATH LAB;  Service: Cardiovascular;  Laterality: N/A;  Possible percutaneous coronary intervention, possible IVUS   LEFT HEART CATHETERIZATION WITH CORONARY ANGIOGRAM N/A 12/15/2011   Procedure: LEFT HEART CATHETERIZATION WITH CORONARY ANGIOGRAM;  Surgeon: Dorn JINNY Lesches, MD;  Location: [facility] CATH LAB;  Service: Cardiovascular;  Laterality: N/A;   LEFT HEART CATHETERIZATION WITH CORONARY ANGIOGRAM N/A 09/05/2014   Procedure: LEFT HEART CATHETERIZATION WITH CORONARY ANGIOGRAM;  Surgeon: Bruckner JONETTA Cash, MD;  Location: [facility] CATH LAB;  Service: Cardiovascular;  Laterality: N/A;   LIPOMA EXCISION     back of the head   NM MYOCAR PERF WALL MOTION  02/2012   lexiscan myoview; mild perfusion defect in mid inferolateral & basal inferolateral region (infarct/scar); EF 52%, abnormal but ow risk scan   PERCUTANEOUS CORONARY STENT INTERVENTION (PCI-S)  09/05/2014   Procedure: PERCUTANEOUS CORONARY STENT INTERVENTION (PCI-S);  Surgeon: Bruckner JONETTA Cash, MD;  Location: [facility] CATH LAB;  Service: Cardiovascular;;   TONSILLECTOMY      Family Psychiatric History:  Family psychiatric history: Reported history of depression in his mother and brother.  Alcohol  use - oldest brother oldest sister, youngest brother.  Reported history of anxiety in sister and mother.  Aunts and uncles and cousins on both sides of the family with addiction issues.  Uncle and great grandmother committed suicide uncle    Family History:  Family History  Problem Relation Age of Onset   Leukemia Mother    Prostate cancer Father    Cancer Brother    Coronary artery disease Paternal Grandmother    Cancer Paternal Grandfather    Migraines Neg Hx    Headache Neg Hx    Colon cancer Neg Hx    Esophageal cancer Neg Hx    Rectal cancer Neg Hx     Stomach cancer Neg Hx     Social History:  Social History   Socioeconomic History   Marital status: Widowed    Spouse name: Not on file   Number of children: 2   Years of education: GED   Highest education level: Some college, no degree  Occupational History   Not on file  Tobacco Use   Smoking status: Former    Current packs/day: 0.00    Average packs/day: 1 pack/day for 10.0 years (10.0 ttl pk-yrs)    Types: Cigarettes    Start date: 10/06/2008    Quit date: 10/07/2018    Years since quitting: 5.2   Smokeless tobacco: Never  Vaping Use   Vaping status: Never Used  Substance and Sexual Activity   Alcohol  use: Not Currently    Comment: Sobriety date 05/04/2023   Drug use: Not Currently    Types: Cocaine   Sexual activity: Not Currently  Other Topics Concern   Not on file  Social History Narrative   ** Merged History Encounter **    Pt states he only smoke when he drinks   Social Drivers of Health   Financial Resource Strain: Low Risk  (12/20/2023)   Overall Financial Resource Strain (CARDIA)    Difficulty of Paying Living Expenses: Not hard at all  Food Insecurity: No Food Insecurity (12/20/2023)   Hunger Vital Sign    Worried About Running Out  of Food in the Last Year: Never true    Ran Out of Food in the Last Year: Never true  Transportation Needs: No Transportation Needs (12/20/2023)   PRAPARE - Administrator, Civil Service (Medical): No    Lack of Transportation (Non-Medical): No  Physical Activity: Sufficiently Active (12/20/2023)   Exercise Vital Sign    Days of Exercise per Week: 4 days    Minutes of Exercise per Session: 40 min  Stress: No Stress Concern Present (12/20/2023)   [employer] of Occupational Health - Occupational Stress Questionnaire    Feeling of Stress: Only a little  Social Connections: Moderately Integrated (12/20/2023)   Social Connection and Isolation Panel    Frequency of Communication with Friends and Family: Three  times a week    Frequency of Social Gatherings with Friends and Family: Once a week    Attends Religious Services: More than 4 times per year    Active Member of [employer] or Organizations: Yes    Attends Banker Meetings: More than 4 times per year    Marital Status: Widowed    Allergies:  Allergies  Allergen Reactions   Bee Venom Anaphylaxis, Hives, Itching and Other (See Comments)    Red eyes   Gadolinium Hives, Itching, Swelling and Other (See Comments)    Swelling of eyes after receiving MR contrast. 13 hr prep recommended. Pt developed large hive and swelling under right eye after contrast, given 50mg  iv benadryl , pt will need premed before gadolinium//lh   Shellfish Allergy Anaphylaxis, Hives, Itching and Other (See Comments)    Red eyes   Statins Other (See Comments)    Myalgias. Tolerating livalo . Pain    Testosterone  Cypionate Other (See Comments)    Testerone Injection --Increased breast tissue     Metabolic Disorder Labs: Lab Results  Component Value Date   HGBA1C 7.2 (A) 12/23/2023   MPG 159.94 02/15/2023   MPG 142.72 08/07/2022   Lab Results  Component Value Date   PROLACTIN 13.0 03/17/2020   Lab Results  Component Value Date   CHOL 154 02/15/2023   TRIG 219 (H) 02/15/2023   HDL 43 02/15/2023   CHOLHDL 3.6 02/15/2023   VLDL 44 (H) 02/15/2023   LDLCALC 67 02/15/2023   LDLCALC 56 11/18/2022   Lab Results  Component Value Date   TSH 2.497 02/15/2023   TSH 1.299 08/07/2022    Therapeutic Level Labs:NA  Current Medications: Current Outpatient Medications  Medication Sig Dispense Refill   ACCU-CHEK GUIDE TEST test strip USE 1 STRIP TO CHECK GLUCOSE THREE TIMES DAILY AS DIRECTED 300 each 0   amLODipine  (NORVASC ) 10 MG tablet Take 1 tablet (10 mg total) by mouth daily. 90 tablet 0   aspirin  EC 81 MG tablet Take by mouth.     busPIRone (BUSPAR) 10 MG tablet Take 1 tablet (10 mg total) by mouth 3 (three) times daily. 30 tablet 2    Cholecalciferol  25 MCG (1000 UT) tablet Take 1,000 Units by mouth daily.     clopidogrel  (PLAVIX ) 75 MG tablet Take 1 tablet by mouth once daily 90 tablet 0   cyclobenzaprine  (FLEXERIL ) 10 MG tablet Take 1 tablet (10 mg total) by mouth 3 (three) times daily as needed for muscle spasms. 30 tablet 0   diphenhydrAMINE  (BENADRYL ) 50 MG tablet Take 1 tablet by mouth 1 hour before MRI 1 tablet 0   EPINEPHrine  0.3 mg/0.3 mL IJ SOAJ injection Inject 0.3 mg into the muscle as needed for  anaphylaxis. 1 each 2   ezetimibe  (ZETIA ) 10 MG tablet Take 1 tablet (10 mg total) by mouth daily. 90 tablet 0   fluticasone  (FLONASE ) 50 MCG/ACT nasal spray Place 2 sprays into both nostrils daily. 9.9 mL 2   LIVALO  4 MG TABS Take 1 tablet by mouth once daily 90 tablet 3   metFORMIN  (GLUCOPHAGE -XR) 500 MG 24 hr tablet Take 1 tablet (500 mg total) by mouth daily with breakfast. 30 tablet 2   metoprolol  tartrate (LOPRESSOR ) 50 MG tablet Take 1 tablet (50 mg total) by mouth 2 (two) times daily. 180 tablet 3   Multiple Vitamin (MULTIVITAMIN WITH MINERALS) TABS tablet Take 1 tablet by mouth daily.     nitroGLYCERIN  (NITROSTAT ) 0.4 MG SL tablet Place 1 tablet (0.4 mg total) under the tongue every 5 (five) minutes x 3 doses as needed for chest pain. 25 tablet 3   Polyethyl Glycol-Propyl Glycol (SYSTANE) 0.4-0.3 % GEL ophthalmic gel Place 1 Application into both eyes daily as needed (For dry eyes).     No current facility-administered medications for this visit.     Musculoskeletal: Strength & Muscle Tone: Telepsych visit-Grossly normal Musculoskeletal and cranial nerve inspections Gait & Station: NA Patient leans: N/A  Psychiatric Specialty Exam: Review of Systems  Constitutional:  Positive for activity change (New job). Negative for appetite change, chills, diaphoresis, fatigue, fever and unexpected weight change.  HENT:  Positive for congestion (see PMH). Negative for postnasal drip, rhinorrhea, sinus pressure, sinus  pain, sneezing, sore throat, tinnitus, trouble swallowing and voice change.        Chronic respiratory problems related to allergies  Eyes:  Positive for pain (DRY eyes). Negative for photophobia, discharge, redness, itching and visual disturbance.  Respiratory:  Negative for apnea, cough, choking, chest tightness, shortness of breath, wheezing and stridor.   Cardiovascular:  Positive for chest pain ([facility] rx PRN Nitro). Negative for palpitations and leg swelling.  Gastrointestinal:  Negative for abdominal distention, abdominal pain, anal bleeding, blood in stool, constipation, diarrhea, nausea, rectal pain and vomiting.  Endocrine: Negative for cold intolerance, heat intolerance, polydipsia, polyphagia and polyuria.       Type II DM Testosterone  deficiency  Genitourinary:  Negative for decreased urine volume, difficulty urinating, dysuria, enuresis, flank pain, frequency, genital sores, hematuria, penile discharge, penile pain, penile swelling, scrotal swelling, testicular pain and urgency.  Musculoskeletal:  Positive for myalgias (with recent URI). Negative for arthralgias, back pain, gait problem, joint swelling, neck pain and neck stiffness.  Skin:  Negative for color change, pallor, rash and wound.  Allergic/Immunologic: Positive for environmental allergies (RX EPI PEN). Negative for food allergies and immunocompromised state.  Neurological:  Negative for dizziness, tremors, seizures, syncope, facial asymmetry, speech difficulty, weakness, light-headedness, numbness and headaches.  Hematological: Negative.   Psychiatric/Behavioral:  Positive for agitation and sleep disturbance (on awakening). Negative for behavioral problems, confusion, decreased concentration, dysphoric mood, hallucinations, self-injury and suicidal ideas. The patient is nervous/anxious. The patient is not hyperactive.     There were no vitals taken for this visit.There is no height or weight on file to calculate BMI.MY Chart  Visit  General Appearance: Casual and Neat  Eye Contact:  Good Wears glasses  Speech:  Clear and Coherent and Normal Rate  Volume:  Normal  Mood:  Euthymic  Affect:  Congruent  Thought Process:  Goal Directed and Descriptions of Associations: Intact  Orientation:  Full (Time, Place, and Person)  Thought Content: WDL and Logical   Suicidal Thoughts:  No  Homicidal Thoughts:  No  Memory:   Childhood trauma   Judgement:  Intact  Insight:  Present  Psychomotor Activity:  NA  Concentration:  Concentration: Good and Attention Span: Good  Recall:  Negative  Fund of Knowledge: WDL  Language: Good  Akathisia:  NA  Handed:  Right  AIMS (if indicated): NA  Assets:  Desire for Improvement Financial Resources/Insurance Housing Resilience Social Support Vocational/Educational  ADL's:  Impaired by sensation/feelings of anxiety  Cognition: WNL  Sleep:  C/O early awakening anxiety   Screenings: AIMS    Flowsheet Row Ad[facility] (Discharged) [facility] (Discharged) from [facility] Admission (Discharged) from [facility] Admission (Discharged) from OP [facility] Ad[facility] (Discharged) [facility]  AIMS Total Score 0 0 0 0 0   AUDIT    Flowsheet Row Office Visit from 11/16/2022 in [facility] Of [facility] Office Visit from 08/27/2020 in [facility] Admission (Discharged) from OP [facility] Admission (Discharged) [facility] Ad[facility] (Discharged) from OP [facility]  Alcohol  Use Disorder Identification Test Final Score (AUDIT) 24 27 28 13 25    GAD-7    Flowsheet Row Office Visit from 08/27/2023 in [facility] Dept Of [facility] Counselor from 05/17/2023 in [facility] Office Visit from 02/15/2023 in [facility] - A Dept Of [facility] Clinical Support from 02/24/2022 in [facility] Office Visit from 01/08/2022 in [facility] - [facility] Dept Of [facility]  Total GAD-7 Score 0 12 10 2 12    PHQ2-9    Flowsheet Row Office Visit from 12/23/2023 in [facility] - A Dept Of [facility] Most recent reading at 12/23/2023  9:07 AM Office Visit from 08/27/2023 in [facility] Dept Of [facility] Most recent reading at 08/27/2023  8:50 AM Counselor from 05/17/2023 in [facility] Most recent reading at 05/17/2023  9:12 AM ED from 02/15/2023 in [facility] Most recent reading at 02/19/2023  9:13 AM ED from 02/15/2023 in [facility] Most recent reading at 02/15/2023  9:08 PM  PHQ-2 Total Score 0 0 3 0 5  PHQ-9 Total Score 3 0 9 -- 17   Flowsheet Row UC from 12/10/2023 in [facility] ED from 08/13/2023 in [facility] UC from 07/16/2023 in [facility] RISK CATEGORY No Risk No Risk No Risk     Assessment  Recovering SUDs patient with new onset of anxiety disorder interfering with sense of normalcy/comfort during ADL  and Plan: Trial Buspar TID PRN (1 dose at bedtime) Effects/side effects discussed FU 2 weeks-sooner if needed   Collaboration of Care: Collaboration of Care: Medication Management  and Other provider involved in patient's care    Patient/ was advised  Release of Information must be obtained prior to any record release in order to collaborate their care with an outside provider. Patient/Guardian was advised if they have not already done so to contact the registration department to sign all necessary forms in order for us  to release information regarding their care.   Consent: Patient gives verbal consent for treatment and assignment of benefits for services provided during this visit. Patient/Guardian expressed understanding and agreed to proceed.    [name], PA-C 12/23/2023, 2:41 PM

## 2023-12-23 NOTE — Progress Notes (Signed)
 Subjective   Patient ID: [name], male    DOB: Sep 17, 1960, 63 y.o.   MRN: [id number]  Chief Complaint  Patient presents with   Medical Management of Chronic Issues    Referring provider: Oley Bascom RAMAN, NP  [name] is a 63 y.o. male with Past Medical History: No date: Alcohol  dependence ([name]) No date: Allergies No date: Arthritis No date: Chest pain No date: Chronic lower back pain No date: Chronic pain of right wrist No date: Cocaine abuse ([name])     Comment:  per note on 01/18/23 No date: Coronary artery disease     Comment:  a. Multiple prior caths/PCI. Cath 2013 with possible               spasm of RCA, 70% ISR of mid LCx with subsequent DES to               mLCx and prox LCX. b. H/o microvascular angina. c.               Recurrent angina 08/2014 - s/p PTCA/DES to prox Cx,               PTCA/CBA to OM1.  c. LHC 06/10/15 with patent stents and               some ISR in LCX and OM-1 that was not flow limiting -->               Rx  No date: Dyslipidemia     Comment:  a. Intolerant to many statins except tolerating Livalo . No date: GERD (gastroesophageal reflux disease) 10/25/2018: H/O cardiac catheterization No date: Heart attack [facility]) No date: Hypertension ~ 2010: Myocardial infarction ([name]) No date: Renal mass 12/15/2011: S/P angioplasty with stent, DES, to proximal and mid LCX  12/15/11 04/2021: S/P foot surgery, right No date: Shoulder pain No date: Stroke [facility])     Comment:  pt. reports had a stroke around time of MI 2010 No date: Type II diabetes mellitus ([name]) No date: Unstable angina ([name])   HPI  Diabetes:   [name] is in today for diabetes follow up. The prescribed treatment is metformin  500 mg  along with therapy zetia . The reported use of treatment is consistent with prescribed. He denies reported side effects from the treatment. Home glucose monitoring indicates a CBG normal range. His goal is to consisently eating healthy.  He states that  he has not been doing well with diet or exercise. There has been a professional eye exam in the year. A1C in office today was 7.2. follows with endocrinology.     He reports that he has note taken his medication. He continues to take metoprolol  50 mg BID. He does not monitor BP or HR at home. Denies f/c/s, n/v/d, hemoptysis, PND, leg swelling Denies chest pain or edema     Note: Cardiology - refilling htn medications Patient is following with urology  Patient has upcoming appointment scheduled with pain clinic for chronic back pain.         Allergies  Allergen Reactions   Bee Venom Anaphylaxis, Hives, Itching and Other (See Comments)    Red eyes   Gadolinium Hives, Itching, Swelling and Other (See Comments)    Swelling of eyes after receiving MR contrast. 13 hr prep recommended. Pt developed large hive and swelling under right eye after contrast, given 50mg  iv benadryl , pt will need premed before gadolinium//lh   Shellfish Allergy Anaphylaxis, Hives, Itching  and Other (See Comments)    Red eyes   Statins Other (See Comments)    Myalgias. Tolerating livalo . Pain    Testosterone  Cypionate Other (See Comments)    Testerone Injection --Increased breast tissue     Immunization History  Administered Date(s) Administered   Influenza,inj,Quad PF,6+ Mos 07/28/2017, 04/01/2018, 03/05/2021   MODERNA COVID-19 SARS-COV-2 PEDS BIVALENT BOOSTER 79yr-20yr 12/09/2019, 01/10/2020   Moderna SARS-COV2 Booster Vaccination 08/13/2020   Pneumococcal Polysaccharide-23 07/28/2017, 03/05/2021   Pneumococcal-Unspecified 06/29/2009   Tdap 02/08/2020   Zoster Recombinant(Shingrix) 01/11/2013    Tobacco History: Social History   Tobacco Use  Smoking Status Former   Current packs/day: 0.00   Average packs/day: 1 pack/day for 10.0 years (10.0 ttl pk-yrs)   Types: Cigarettes   Start date: 10/06/2008   Quit date: 10/07/2018   Years since quitting: 5.2  Smokeless Tobacco Never   Counseling given:  Not Answered   Outpatient Encounter Medications as of 12/23/2023  Medication Sig   ACCU-CHEK GUIDE TEST test strip USE 1 STRIP TO CHECK GLUCOSE THREE TIMES DAILY AS DIRECTED   amLODipine  (NORVASC ) 10 MG tablet Take 1 tablet (10 mg total) by mouth daily.   aspirin  EC 81 MG tablet Take by mouth.   Cholecalciferol  25 MCG (1000 UT) tablet Take 1,000 Units by mouth daily.   clopidogrel  (PLAVIX ) 75 MG tablet Take 1 tablet by mouth once daily   cyclobenzaprine  (FLEXERIL ) 10 MG tablet Take 1 tablet (10 mg total) by mouth 3 (three) times daily as needed for muscle spasms.   diphenhydrAMINE  (BENADRYL ) 50 MG tablet Take 1 tablet by mouth 1 hour before MRI   EPINEPHrine  0.3 mg/0.3 mL IJ SOAJ injection Inject 0.3 mg into the muscle as needed for anaphylaxis.   ezetimibe  (ZETIA ) 10 MG tablet Take 1 tablet (10 mg total) by mouth daily.   fluticasone  (FLONASE ) 50 MCG/ACT nasal spray Place 2 sprays into both nostrils daily.   LIVALO  4 MG TABS Take 1 tablet by mouth once daily   metFORMIN  (GLUCOPHAGE -XR) 500 MG 24 hr tablet Take 1 tablet (500 mg total) by mouth daily with breakfast.   metoprolol  tartrate (LOPRESSOR ) 50 MG tablet Take 1 tablet (50 mg total) by mouth 2 (two) times daily.   Multiple Vitamin (MULTIVITAMIN WITH MINERALS) TABS tablet Take 1 tablet by mouth daily.   nitroGLYCERIN  (NITROSTAT ) 0.4 MG SL tablet Place 1 tablet (0.4 mg total) under the tongue every 5 (five) minutes x 3 doses as needed for chest pain.   Polyethyl Glycol-Propyl Glycol (SYSTANE) 0.4-0.3 % GEL ophthalmic gel Place 1 Application into both eyes daily as needed (For dry eyes).   [DISCONTINUED] metoprolol  tartrate (LOPRESSOR ) 100 MG tablet Take 1 tablet (100 mg total) by mouth 2 (two) times daily. NEED OV.   meloxicam  (MOBIC ) 15 MG tablet Take 1 tablet (15 mg total) by mouth daily. (Patient not taking: Reported on 12/23/2023)   No facility-administered encounter medications on file as of 12/23/2023.    Review of Systems  Review  of Systems  Constitutional: Negative.   HENT: Negative.    Cardiovascular: Negative.   Gastrointestinal: Negative.   Allergic/Immunologic: Negative.   Neurological: Negative.   Psychiatric/Behavioral: Negative.       Objective:   BP (!) 126/90   Pulse 64   Temp 97.7 F (36.5 C) (Oral)   Wt 192 lb 3.2 oz (87.2 kg)   SpO2 93%   BMI 29.22 kg/m   Wt Readings from Last 5 Encounters:  12/23/23 192 lb 3.2 oz (  87.2 kg)  09/23/23 195 lb (88.5 kg)  08/27/23 195 lb 6.4 oz (88.6 kg)  08/26/23 195 lb (88.5 kg)  08/13/23 195 lb (88.5 kg)     Physical Exam Vitals and nursing note reviewed.  Constitutional:      General: He is not in acute distress.    Appearance: He is well-developed.   Cardiovascular:     Rate and Rhythm: Normal rate and regular rhythm.  Pulmonary:     Effort: Pulmonary effort is normal.     Breath sounds: Normal breath sounds.   Skin:    General: Skin is warm and dry.   Neurological:     Mental Status: He is alert and oriented to person, place, and time.       Assessment & Plan:   Chronic low back pain, unspecified back pain laterality, unspecified whether sciatica present -     Cyclobenzaprine  HCl; Take 1 tablet (10 mg total) by mouth 3 (three) times daily as needed for muscle spasms.  Dispense: 30 tablet; Refill: 0  Type 2 diabetes mellitus with other circulatory complication, without long-term current use of insulin  ([name]) -     Microalbumin / creatinine urine ratio -     POCT glycosylated hemoglobin (Hb A1C) -     CBC -     Comprehensive metabolic panel with GFR  Prostate cancer screening -     PSA  Lipid screening -     Lipid panel  Multiple atypical skin moles -     Ambulatory referral to Dermatology  Other orders -     Metoprolol  Tartrate; Take 1 tablet (50 mg total) by mouth 2 (two) times daily.  Dispense: 180 tablet; Refill: 3     Return in about 3 months (around 03/24/2024).   Bascom GORMAN Borer, NP 12/23/2023

## 2023-12-23 NOTE — Addendum Note (Signed)
 Addended by: LEILA CARLIN BRAVO on: 12/23/2023 02:00 PM   Modules accepted: Orders

## 2023-12-24 ENCOUNTER — Ambulatory Visit: Payer: Self-pay | Admitting: Nurse Practitioner

## 2023-12-24 LAB — COMPREHENSIVE METABOLIC PANEL WITH GFR
ALT: 61 IU/L — ABNORMAL HIGH (ref 0–44)
AST: 50 IU/L — ABNORMAL HIGH (ref 0–40)
Albumin: 4.9 g/dL (ref 3.9–4.9)
Alkaline Phosphatase: 84 IU/L (ref 44–121)
BUN/Creatinine Ratio: 19 (ref 10–24)
BUN: 20 mg/dL (ref 8–27)
Bilirubin Total: 0.7 mg/dL (ref 0.0–1.2)
CO2: 18 mmol/L — ABNORMAL LOW (ref 20–29)
Calcium: 10.3 mg/dL — ABNORMAL HIGH (ref 8.6–10.2)
Chloride: 101 mmol/L (ref 96–106)
Creatinine, Ser: 1.06 mg/dL (ref 0.76–1.27)
Globulin, Total: 2.3 g/dL (ref 1.5–4.5)
Glucose: 140 mg/dL — ABNORMAL HIGH (ref 70–99)
Potassium: 4.7 mmol/L (ref 3.5–5.2)
Sodium: 137 mmol/L (ref 134–144)
Total Protein: 7.2 g/dL (ref 6.0–8.5)
eGFR: 79 mL/min/{1.73_m2} (ref >59)

## 2023-12-24 LAB — CBC
Hematocrit: 48.4 % (ref 37.5–51.0)
Hemoglobin: 15.4 g/dL (ref 13.0–17.7)
MCH: 27.6 pg (ref 26.6–33.0)
MCHC: 31.8 g/dL (ref 31.5–35.7)
MCV: 87 fL (ref 79–97)
Platelets: 248 10*3/uL (ref 150–450)
RBC: 5.57 x10E6/uL (ref 4.14–5.80)
RDW: 13 % (ref 11.6–15.4)
WBC: 5.4 10*3/uL (ref 3.4–10.8)

## 2023-12-24 LAB — LIPID PANEL
Chol/HDL Ratio: 3.9 ratio (ref 0.0–5.0)
Cholesterol, Total: 156 mg/dL (ref 100–199)
HDL: 40 mg/dL (ref >39)
LDL Chol Calc (NIH): 96 mg/dL (ref 0–99)
Triglycerides: 109 mg/dL (ref 0–149)
VLDL Cholesterol Cal: 20 mg/dL (ref 5–40)

## 2023-12-24 LAB — MICROALBUMIN / CREATININE URINE RATIO
Creatinine, Urine: 225.4 mg/dL
Microalb/Creat Ratio: 9 mg/g{creat} (ref 0–29)
Microalbumin, Urine: 20.3 ug/mL

## 2023-12-24 LAB — PSA: Prostate Specific Ag, Serum: 1 ng/mL (ref 0.0–4.0)

## 2023-12-28 ENCOUNTER — Other Ambulatory Visit: Payer: Self-pay | Admitting: Nurse Practitioner

## 2024-01-24 ENCOUNTER — Ambulatory Visit (HOSPITAL_COMMUNITY)
Admission: EM | Admit: 2024-01-24 | Discharge: 2024-01-25 | Disposition: A | Discharge status: Other Acute Inpatient Hospital | Payer: MEDICAID | Attending: Psychiatry | Admitting: Psychiatry

## 2024-01-24 DIAGNOSIS — F1010 Alcohol abuse, uncomplicated: Secondary | ICD-10-CM

## 2024-01-24 DIAGNOSIS — F1020 Alcohol dependence, uncomplicated: Secondary | ICD-10-CM | POA: Insufficient documentation

## 2024-01-24 DIAGNOSIS — G8929 Other chronic pain: Secondary | ICD-10-CM | POA: Insufficient documentation

## 2024-01-24 DIAGNOSIS — Z7984 Long term (current) use of oral hypoglycemic drugs: Secondary | ICD-10-CM | POA: Insufficient documentation

## 2024-01-24 DIAGNOSIS — Z79899 Other long term (current) drug therapy: Secondary | ICD-10-CM | POA: Insufficient documentation

## 2024-01-24 DIAGNOSIS — M549 Dorsalgia, unspecified: Secondary | ICD-10-CM | POA: Insufficient documentation

## 2024-01-24 DIAGNOSIS — F411 Generalized anxiety disorder: Secondary | ICD-10-CM | POA: Insufficient documentation

## 2024-01-24 DIAGNOSIS — Z7902 Long term (current) use of antithrombotics/antiplatelets: Secondary | ICD-10-CM | POA: Insufficient documentation

## 2024-01-24 LAB — POCT URINE DRUG SCREEN - MANUAL ENTRY (I-SCREEN)
POC Amphetamine UR: NOT DETECTED
POC Buprenorphine (BUP): NOT DETECTED
POC Cocaine UR: NOT DETECTED
POC Marijuana UR: NOT DETECTED
POC Methadone UR: NOT DETECTED
POC Methamphetamine UR: NOT DETECTED
POC Morphine: NOT DETECTED
POC Oxazepam (BZO): NOT DETECTED
POC Oxycodone UR: NOT DETECTED
POC Secobarbital (BAR): NOT DETECTED

## 2024-01-24 LAB — GLUCOSE, CAPILLARY: Glucose-Capillary: 217 mg/dL — ABNORMAL HIGH (ref 70–99)

## 2024-01-24 MED ORDER — CLOPIDOGREL BISULFATE 75 MG PO TABS
75.0000 mg | ORAL_TABLET | Freq: Every day | ORAL | Status: DC
  Administered 2024-01-25: 75 mg via ORAL
  Filled 2024-01-24: qty 1

## 2024-01-24 MED ORDER — OLANZAPINE 5 MG PO TBDP: 5.0000 mg | ORAL_TABLET | Freq: Three times a day (TID) | ORAL | Status: DC | PRN

## 2024-01-24 MED ORDER — EZETIMIBE 10 MG PO TABS
10.0000 mg | ORAL_TABLET | Freq: Every day | ORAL | Status: DC
  Administered 2024-01-25: 10 mg via ORAL
  Filled 2024-01-24: qty 1

## 2024-01-24 MED ORDER — CHLORDIAZEPOXIDE HCL 25 MG PO CAPS: 25.0000 mg | ORAL_CAPSULE | Freq: Four times a day (QID) | ORAL | Status: DC | PRN

## 2024-01-24 MED ORDER — CHLORDIAZEPOXIDE HCL 25 MG PO CAPS: 25.0000 mg | ORAL_CAPSULE | Freq: Every day | ORAL | Status: DC

## 2024-01-24 MED ORDER — CHLORDIAZEPOXIDE HCL 25 MG PO CAPS
25.0000 mg | ORAL_CAPSULE | Freq: Four times a day (QID) | ORAL | Status: DC
  Administered 2024-01-24 – 2024-01-25 (×3): 25 mg via ORAL
  Filled 2024-01-24 (×3): qty 1

## 2024-01-24 MED ORDER — CHLORDIAZEPOXIDE HCL 25 MG PO CAPS: 25.0000 mg | ORAL_CAPSULE | Freq: Three times a day (TID) | ORAL | Status: DC

## 2024-01-24 MED ORDER — LOPERAMIDE HCL 2 MG PO CAPS: 2.0000 mg | ORAL_CAPSULE | ORAL | Status: DC | PRN

## 2024-01-24 MED ORDER — THIAMINE HCL 100 MG/ML IJ SOLN
100.0000 mg | Freq: Once | INTRAMUSCULAR | Status: AC
  Administered 2024-01-24: 100 mg via INTRAMUSCULAR
  Filled 2024-01-24: qty 2

## 2024-01-24 MED ORDER — ASPIRIN 81 MG PO TBEC
81.0000 mg | DELAYED_RELEASE_TABLET | Freq: Every day | ORAL | Status: DC
  Administered 2024-01-25: 81 mg via ORAL
  Filled 2024-01-24: qty 1

## 2024-01-24 MED ORDER — METOPROLOL TARTRATE 50 MG PO TABS
50.0000 mg | ORAL_TABLET | Freq: Two times a day (BID) | ORAL | Status: DC
  Administered 2024-01-25: 50 mg via ORAL
  Filled 2024-01-24: qty 1

## 2024-01-24 MED ORDER — OLANZAPINE 10 MG IM SOLR: 5.0000 mg | Freq: Three times a day (TID) | INTRAMUSCULAR | Status: DC | PRN

## 2024-01-24 MED ORDER — CHLORDIAZEPOXIDE HCL 25 MG PO CAPS: 25.0000 mg | ORAL_CAPSULE | ORAL | Status: DC

## 2024-01-24 MED ORDER — MAGNESIUM HYDROXIDE 400 MG/5ML PO SUSP: 30.0000 mL | Freq: Every day | ORAL | Status: DC | PRN

## 2024-01-24 MED ORDER — INSULIN ASPART 100 UNIT/ML IJ SOLN
0.0000 [IU] | Freq: Every day | INTRAMUSCULAR | Status: DC
  Administered 2024-01-25: 2 [IU] via SUBCUTANEOUS

## 2024-01-24 MED ORDER — ADULT MULTIVITAMIN W/MINERALS CH
1.0000 | ORAL_TABLET | Freq: Every day | ORAL | Status: DC
  Administered 2024-01-25: 1 via ORAL
  Filled 2024-01-24: qty 1

## 2024-01-24 MED ORDER — METFORMIN HCL ER 500 MG PO TB24: 500.0000 mg | ORAL_TABLET | Freq: Every day | ORAL | Status: DC

## 2024-01-24 MED ORDER — ALUM & MAG HYDROXIDE-SIMETH 200-200-20 MG/5ML PO SUSP: 30.0000 mL | ORAL | Status: DC | PRN

## 2024-01-24 MED ORDER — INSULIN ASPART 100 UNIT/ML IJ SOLN: 0.0000 [IU] | Freq: Three times a day (TID) | INTRAMUSCULAR | Status: DC

## 2024-01-24 MED ORDER — TRAZODONE HCL 50 MG PO TABS
50.0000 mg | ORAL_TABLET | Freq: Every evening | ORAL | Status: DC | PRN
  Administered 2024-01-24: 50 mg via ORAL
  Filled 2024-01-24: qty 1

## 2024-01-24 MED ORDER — ONDANSETRON 4 MG PO TBDP: 4.0000 mg | ORAL_TABLET | Freq: Four times a day (QID) | ORAL | Status: DC | PRN

## 2024-01-24 MED ORDER — HYDROXYZINE HCL 25 MG PO TABS: 25.0000 mg | ORAL_TABLET | Freq: Four times a day (QID) | ORAL | Status: DC | PRN

## 2024-01-24 MED ORDER — ACETAMINOPHEN 325 MG PO TABS
650.0000 mg | ORAL_TABLET | Freq: Four times a day (QID) | ORAL | Status: DC | PRN
  Administered 2024-01-24: 650 mg via ORAL
  Filled 2024-01-24: qty 2

## 2024-01-24 NOTE — ED Provider Notes (Incomplete)
 [facility] Continuous Assessment Admission H&P  Date: 01/24/24 Patient Name: [name] MRN: [id number] Chief Complaint: ***  Diagnoses:  Final diagnoses:  Alcohol  abuse  GAD (generalized anxiety disorder)    HPI: ***  Total Time spent with patient: 30 minutes  Musculoskeletal  Strength & Muscle Tone: within normal limits Gait & Station: normal Patient leans: N/A  Psychiatric Specialty Exam  Presentation General Appearance:  Appropriate for Environment  Eye Contact: Good  Speech: Clear and Coherent  Speech Volume: Normal  Handedness: Right   Mood and Affect  Mood: Anxious  Affect: Congruent   Thought Process  Thought Processes: Coherent; Goal Directed  Descriptions of Associations:Intact  Orientation:Full (Time, Place and Person)  Thought Content:WDL  Diagnosis of Schizophrenia or Schizoaffective disorder in past: No   Hallucinations:Hallucinations: None  Ideas of Reference:None  Suicidal Thoughts:Suicidal Thoughts: No  Homicidal Thoughts:Homicidal Thoughts: No   Sensorium  Memory: Immediate Good  Judgment: Intact  Insight: Present   Executive Functions  Concentration: Good  Attention Span: Good  Recall: Fair  Fund of Knowledge: Good  Language: Good   Psychomotor Activity  Psychomotor Activity: Psychomotor Activity: Normal   Assets  Assets: Communication Skills; Desire for Improvement   Sleep  Sleep: Sleep: Poor   Nutritional Assessment (For OBS and FBC admissions only) Has the patient had a weight loss or gain of 10 pounds or more in the last 3 months?: No Has the patient had a decrease in food intake/or appetite?: No Does the patient have dental problems?: No Does the patient have eating habits or behaviors that may be indicators of an eating disorder including binging or inducing vomiting?: No Has the patient recently lost weight without trying?: 0 Has the patient been eating poorly because  of a decreased appetite?: 0 Malnutrition Screening Tool Score: 0    Physical Exam Constitutional:      Appearance: He is not toxic-appearing or diaphoretic.  HENT:     Nose: No congestion.  Cardiovascular:     Rate and Rhythm: Normal rate.  Pulmonary:     Effort: No respiratory distress.  Chest:     Chest wall: No tenderness.  Neurological:     Mental Status: He is alert and oriented to person, place, and time.  Psychiatric:        Attention and Perception: Attention and perception normal.        Mood and Affect: Mood is anxious. Affect is blunt.        Speech: Speech normal.        Behavior: Behavior is cooperative.        Thought Content: Thought content normal.        Cognition and Memory: Cognition and memory normal.    Review of Systems  Constitutional:  Negative for chills, diaphoresis and fever.  HENT:  Negative for congestion.   Eyes:  Negative for discharge.  Respiratory:  Negative for cough, shortness of breath and wheezing.   Cardiovascular:  Negative for chest pain and palpitations.  Gastrointestinal:  Negative for diarrhea, nausea and vomiting.  Neurological:  Negative for dizziness, seizures, weakness and headaches.  Psychiatric/Behavioral:  Positive for substance abuse. The patient is nervous/anxious.     Blood pressure 120/89, pulse 77, temperature 98.6 F (37 C), temperature source Oral, resp. rate 15, SpO2 99%. There is no height or weight on file to calculate BMI.  Past Psychiatric History: See H & P   Is the patient at risk to self? Yes  Has the  patient been a risk to self in the past 6 months? No .    Has the patient been a risk to self within the distant past? No   Is the patient a risk to others? No   Has the patient been a risk to others in the past 6 months? No   Has the patient been a risk to others within the distant past? No   Past Medical History: See Chart  Family History: N/A  Social History: N/A  Last Labs:  Office Visit on  12/23/2023  Component Date Value Ref Range Status  . Creatinine, Urine 12/23/2023 225.4  Not Estab. mg/dL Final  . Microalbumin, Urine 12/23/2023 20.3  Not Estab. ug/mL Final  . Microalb/Creat Ratio 12/23/2023 9  0 - 29 mg/g creat Final   Comment:                        Normal:                0 -  29                        Moderately increased: 30 - 300                        Severely increased:       >300   . Hemoglobin A1C 12/23/2023 7.2 (A)  4.0 - 5.6 % Final  . WBC 12/23/2023 5.4  3.4 - 10.8 x10E3/uL Final  . RBC 12/23/2023 5.57  4.14 - 5.80 x10E6/uL Final  . Hemoglobin 12/23/2023 15.4  13.0 - 17.7 g/dL Final  . Hematocrit 93/73/7974 48.4  37.5 - 51.0 % Final  . MCV 12/23/2023 87  79 - 97 fL Final  . MCH 12/23/2023 27.6  26.6 - 33.0 pg Final  . MCHC 12/23/2023 31.8  31.5 - 35.7 g/dL Final  . RDW 93/73/7974 13.0  11.6 - 15.4 % Final  . Platelets 12/23/2023 248  150 - 450 x10E3/uL Final  . Glucose 12/23/2023 140 (H)  70 - 99 mg/dL Final  . BUN 93/73/7974 20  8 - 27 mg/dL Final  . Creatinine, Ser 12/23/2023 1.06  0.76 - 1.27 mg/dL Final  . eGFR 93/73/7974 79  >59 mL/min/1.73 Final  . BUN/Creatinine Ratio 12/23/2023 19  10 - 24 Final  . Sodium 12/23/2023 137  134 - 144 mmol/L Final  . Potassium 12/23/2023 4.7  3.5 - 5.2 mmol/L Final  . Chloride 12/23/2023 101  96 - 106 mmol/L Final  . CO2 12/23/2023 18 (L)  20 - 29 mmol/L Final  . Calcium  12/23/2023 10.3 (H)  8.6 - 10.2 mg/dL Final  . Total Protein 12/23/2023 7.2  6.0 - 8.5 g/dL Final  . Albumin 93/73/7974 4.9  3.9 - 4.9 g/dL Final  . Globulin, Total 12/23/2023 2.3  1.5 - 4.5 g/dL Final  . Bilirubin Total 12/23/2023 0.7  0.0 - 1.2 mg/dL Final  . Alkaline Phosphatase 12/23/2023 84  44 - 121 IU/L Final  . AST 12/23/2023 50 (H)  0 - 40 IU/L Final  . ALT 12/23/2023 61 (H)  0 - 44 IU/L Final  . Cholesterol, Total 12/23/2023 156  100 - 199 mg/dL Final  . Triglycerides 12/23/2023 109  0 - 149 mg/dL Final  . HDL 93/73/7974 40  >39 mg/dL  Final  . VLDL Cholesterol Cal 12/23/2023 20  5 - 40 mg/dL Final  . LDL Chol  Calc (NIH) 12/23/2023 96  0 - 99 mg/dL Final  . Chol/HDL Ratio 12/23/2023 3.9  0.0 - 5.0 ratio Final   Comment:                                   T. Chol/HDL Ratio                                             Men  Women                               1/2 Avg.Risk  3.4    3.3                                   Avg.Risk  5.0    4.4                                2X Avg.Risk  9.6    7.1                                3X Avg.Risk 23.4   11.0   . Prostate Specific Ag, Serum 12/23/2023 1.0  0.0 - 4.0 ng/mL Final   Comment: Roche ECLIA methodology. According to the American Urological Association, Serum PSA should decrease and remain at undetectable levels after radical prostatectomy. The AUA defines biochemical recurrence as an initial PSA value 0.2 ng/mL or greater followed by a subsequent confirmatory PSA value 0.2 ng/mL or greater. Values obtained with different assay methods or kits cannot be used interchangeably. Results cannot be interpreted as absolute evidence of the presence or absence of malignant disease.   Office Visit on 09/23/2023  Component Date Value Ref Range Status  . Specific Gravity, UA 09/23/2023 1.025  1.005 - 1.030 Final  . pH, UA 09/23/2023 5.5  5.0 - 7.5 Final  . Color, UA 09/23/2023 Yellow  Yellow Final  . Appearance Ur 09/23/2023 Clear  Clear Final  . Leukocytes,UA 09/23/2023 Negative  Negative Final  . Protein,UA 09/23/2023 1+ (A)  Negative/Trace Final  . Glucose, UA 09/23/2023 Negative  Negative Final  . Ketones, UA 09/23/2023 Negative  Negative Final  . RBC, UA 09/23/2023 Negative  Negative Final  . Bilirubin, UA 09/23/2023 Negative  Negative Final  . Urobilinogen, Ur 09/23/2023 0.2  0.2 - 1.0 mg/dL Final  . Nitrite, UA 96/72/7974 Negative  Negative Final  . Microscopic Examination 09/23/2023 See below:   Final  . WBC, UA 09/23/2023 0-5  0 - 5 /hpf Final  . RBC, Urine 09/23/2023 0-2   0 - 2 /hpf Final  . Epithelial Cells (non renal) 09/23/2023 0-10  0 - 10 /hpf Final  . Casts 09/23/2023 Present  None seen /lpf Final  . Cast Type 09/23/2023 Hyaline casts  N/A Final  . Bacteria, UA 09/23/2023 Few  None seen/Few Final  Office Visit on 08/27/2023  Component Date Value Ref Range Status  . Hemoglobin A1C 08/27/2023 7.1 (A)  4.0 - 5.6 % Final  Office Visit on 08/26/2023  Component Date Value Ref Range Status  .  Specific Gravity, UA 08/26/2023 1.025  1.005 - 1.030 Final  . pH, UA 08/26/2023 5.5  5.0 - 7.5 Final  . Color, UA 08/26/2023 Orange  Yellow Final  . Appearance Ur 08/26/2023 Clear  Clear Final  . Ave MCMURRAY 08/26/2023 Negative  Negative Final  . Protein,UA 08/26/2023 1+ (A)  Negative/Trace Final  . Glucose, UA 08/26/2023 Negative  Negative Final  . Ketones, UA 08/26/2023 Trace (A)  Negative Final  . RBC, UA 08/26/2023 Negative  Negative Final  . Bilirubin, UA 08/26/2023 Negative  Negative Final  . Urobilinogen, Ur 08/26/2023 1.0  0.2 - 1.0 mg/dL Final  . Nitrite, UA 97/72/7974 Negative  Negative Final  . Microscopic Examination 08/26/2023 See below:   Final  . WBC, UA 08/26/2023 0-5  0 - 5 /hpf Final  . RBC, Urine 08/26/2023 0-2  0 - 2 /hpf Final  . Epithelial Cells (non renal) 08/26/2023 0-10  0 - 10 /hpf Final  . Casts 08/26/2023 Present  None seen /lpf Final  . Cast Type 08/26/2023 Hyaline casts  N/A Final  . Mucus, UA 08/26/2023 Present (A)  Not Estab. Final  . Bacteria, UA 08/26/2023 Few  None seen/Few Final  Admission on 08/13/2023, Discharged on 08/13/2023  Component Date Value Ref Range Status  . Sodium 08/13/2023 137  135 - 145 mmol/L Final  . Potassium 08/13/2023 4.5  3.5 - 5.1 mmol/L Final  . Chloride 08/13/2023 107  98 - 111 mmol/L Final  . CO2 08/13/2023 22  22 - 32 mmol/L Final  . Glucose, Bld 08/13/2023 137 (H)  70 - 99 mg/dL Final   Glucose reference range applies only to samples taken after fasting for at least 8 hours.  . BUN 08/13/2023  13  8 - 23 mg/dL Final  . Creatinine, Ser 08/13/2023 1.02  0.61 - 1.24 mg/dL Final  . Calcium  08/13/2023 9.3  8.9 - 10.3 mg/dL Final  . Total Protein 08/13/2023 7.0  6.5 - 8.1 g/dL Final  . Albumin 97/85/7974 4.3  3.5 - 5.0 g/dL Final  . AST 97/85/7974 23  15 - 41 U/L Final  . ALT 08/13/2023 32  0 - 44 U/L Final  . Alkaline Phosphatase 08/13/2023 46  38 - 126 U/L Final  . Total Bilirubin 08/13/2023 0.6  0.0 - 1.2 mg/dL Final  . GFR, Estimated 08/13/2023 >60  >60 mL/min Final   Comment: (NOTE) Calculated using the CKD-EPI Creatinine Equation (2021)   . Anion gap 08/13/2023 8  5 - 15 Final   Performed at [facility], [address] [place], [place] [address]  . WBC 08/13/2023 5.8  4.0 - 10.5 K/uL Final  . RBC 08/13/2023 5.35  4.22 - 5.81 MIL/uL Final  . Hemoglobin 08/13/2023 14.8  13.0 - 17.0 g/dL Final  . HCT 97/85/7974 45.0  39.0 - 52.0 % Final  . MCV 08/13/2023 84.1  80.0 - 100.0 fL Final  . MCH 08/13/2023 27.7  26.0 - 34.0 pg Final  . MCHC 08/13/2023 32.9  30.0 - 36.0 g/dL Final  . RDW 97/85/7974 12.8  11.5 - 15.5 % Final  . Platelets 08/13/2023 219  150 - 400 K/uL Final  . nRBC 08/13/2023 0.0  0.0 - 0.2 % Final   Performed at [facility], [address], [place], [place] [address]  . Color, Urine 08/13/2023 AMBER (A)  YELLOW Final   BIOCHEMICALS MAY BE AFFECTED BY COLOR  . APPearance 08/13/2023 HAZY (A)  CLEAR Final  . Specific Gravity, Urine  08/13/2023 1.027  1.005 - 1.030 Final  . pH 08/13/2023 5.0  5.0 - 8.0 Final  . Glucose, UA 08/13/2023 NEGATIVE  NEGATIVE mg/dL Final  . Hgb urine dipstick 08/13/2023 NEGATIVE  NEGATIVE Final  . Bilirubin Urine 08/13/2023 NEGATIVE  NEGATIVE Final  . Ketones, ur 08/13/2023 NEGATIVE  NEGATIVE mg/dL Final  . Protein, ur 97/85/7974 30 (A)  NEGATIVE mg/dL Final  . Nitrite 97/85/7974 NEGATIVE  NEGATIVE Final  . [address] Mc[address]ray 08/13/2023 NEGATIVE  NEGATIVE Final  . RBC / HPF 08/13/2023 0-5  0 - 5 RBC/hpf Final  . WBC, UA  08/13/2023 0-5  0 - 5 WBC/hpf Final  . Bacteria, UA 08/13/2023 NONE SEEN  NONE SEEN Final  . Squamous Epithelial / HPF 08/13/2023 0-5  0 - 5 /HPF Final  . Mucus 08/13/2023 PRESENT   Final  . Hyaline Casts, UA 08/13/2023 PRESENT   Final  . Ca Oxalate Crys, UA 08/13/2023 PRESENT   Final   Performed at [facility], [address], [place], [place] [address]  . Neisseria Gonorrhea 08/13/2023 Negative   Final  . Chlamydia 08/13/2023 Negative   Final  . Comment 08/13/2023 Normal Reference Ranger Chlamydia - Negative   Final  . Comment 08/13/2023 Normal Reference Range Neisseria Gonorrhea - Negative   Final    Allergies: Bee venom, Gadolinium, Shellfish allergy, Statins, and Testosterone  cypionate  Medications:  PTA Medications  Medication Sig  . EPINEPHrine  0.3 mg/0.3 mL IJ SOAJ injection Inject 0.3 mg into the muscle as needed for anaphylaxis.  . nitroGLYCERIN  (NITROSTAT ) 0.4 MG SL tablet Place 1 tablet (0.4 mg total) under the tongue every 5 (five) minutes x 3 doses as needed for chest pain.  . Polyethyl Glycol-Propyl Glycol (SYSTANE) 0.4-0.3 % GEL ophthalmic gel Place 1 Application into both eyes daily as needed (For dry eyes).  . Cholecalciferol  25 MCG (1000 UT) tablet Take 1,000 Units by mouth daily.  . Multiple Vitamin (MULTIVITAMIN WITH MINERALS) TABS tablet Take 1 tablet by mouth daily.  . LIVALO  4 MG TABS Take 1 tablet by mouth once daily  . metFORMIN  (GLUCOPHAGE -XR) 500 MG 24 hr tablet Take 1 tablet (500 mg total) by mouth daily with breakfast.  . amLODipine  (NORVASC ) 10 MG tablet Take 1 tablet (10 mg total) by mouth daily.  . ezetimibe  (ZETIA ) 10 MG tablet Take 1 tablet (10 mg total) by mouth daily.  . aspirin  EC 81 MG tablet Take by mouth.  . diphenhydrAMINE  (BENADRYL ) 50 MG tablet Take 1 tablet by mouth 1 hour before MRI  . clopidogrel  (PLAVIX ) 75 MG tablet Take 1 tablet by mouth once daily  . fluticasone  (FLONASE ) 50 MCG/ACT nasal spray Place 2 sprays into both nostrils  daily.  . metoprolol  tartrate (LOPRESSOR ) 50 MG tablet Take 1 tablet (50 mg total) by mouth 2 (two) times daily.  . cyclobenzaprine  (FLEXERIL ) 10 MG tablet Take 1 tablet (10 mg total) by mouth 3 (three) times daily as needed for muscle spasms.  . busPIRone  (BUSPAR ) 10 MG tablet Take 1 tablet (10 mg total) by mouth 3 (three) times daily.  SABRA ACCU-CHEK GUIDE TEST test strip USE 1 STRIP TO CHECK GLUCOSE THREE TIMES DAILY AS DIRECTED      Medical Decision Making      Recommendations  Based on my evaluation the patient does not appear to have an emergency medical condition.  Recommend admission to the [facility] for substance abuse treatment.   Thurman LULLA Ivans, NP 01/24/24  10:03 PM

## 2024-01-24 NOTE — Progress Notes (Signed)
   01/24/24 1920  [facility] Triage Screening (Walk-ins at [facility] only)  How Did You Hear About Us ? Self  What Is the Reason for Your Visit/Call Today? [name] arrived to the [facility] on a referral from his primary care doctor, to seek detox after he ahda test ran. He has been drinking alcohol  everyday for 2 weeks and thats following 8 months of sobriety. He states that his brother had a stroke and he visitied and after the visit he was not feeling good and his chronic back pain. He is scheduled to have surgery next month and wants to be sober before his operation. He is diagnosed with MDD and GAD and was medicated but he states that once he stopped drinking those symptoms went a way.  How Long Has This Been Causing You Problems? 1 wk - 1 month  Have You Recently Had Any Thoughts About Hurting Yourself? No  Are You Planning to Commit Suicide/Harm Yourself At This time? No  Have you Recently Had Thoughts About Hurting Someone [name]? No  Are You Planning To Harm Someone At This Time? No  Physical Abuse Denies  Verbal Abuse Denies  Sexual Abuse Denies  Exploitation of patient/patient's resources Denies  Self-Neglect Denies  Are you currently experiencing any auditory, visual or other hallucinations? No  Have You Used Any Alcohol  or Drugs in the Past 24 Hours? Yes  What Did You Use and How Much? Drank this morning at 5:30am to stop shaking  Do you have any current medical co-morbidities that require immediate attention? Yes  Please describe current medical co-morbidities that require immediate attention: Heart Disease and has had a heart attack in the past and a stroke in the past, diabetes type 2, and high blood pressure, and degenerative disease in his neck and lower back.  What Do You Feel Would Help You the Most Today? Stress Management;Social Support;Alcohol  or Drug Use Treatment  If access to [facility] was not available, would you have sought care in the Emergency Department? No  Determination of  Need Routine (7 days)  Options For Referral [facility];Therapeutic Triage Services;Outpatient Therapy;Facility-Based Crisis;Other: Comment

## 2024-01-24 NOTE — ED Provider Notes (Signed)
 [facility] Continuous Assessment Admission H&P  Date: 01/25/24 Patient Name: [name] MRN: [id number] Chief Complaint:  I'm here for detox.  Diagnoses:  Final diagnoses:  Alcohol  abuse  GAD (generalized anxiety disorder)    HPI: [name] is a 63 year old male with psychiatric history of alcohol  dependence, cocaine abuse and medical history significant for stroke, heart attack and angioplasy with stent, who presented voluntarily as a walk in to [facility] on referral from is PCP to seek detox from alcohol .  Patient was seen face to face by this provider and chart reviewed.  Patient reports  I'm here for detox from alcohol , I've been drinking gin(1 pint to 1 liter/daily) for two and half weeks now, I'm coming off almost 8 months of sobriety, but I relapsed. Last drink yesterday at 5:30 am.  Patient reports having some shakes with withdrawals, no seizures.  He denies other illicit substance use.  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 He reports being scheduled for surgery August 6th.   He reports having severe anxiety and uses herbs and spices such as tumeric, ginger and cloves in the past successfully, but it doesn't seem to help anymore.  He reports currently being prescribed Metformin , Plavix  and Aspirin  Metoprolol , Livalo , and Ezetimibe .  Patient reports he lives alone-widowed and is retired. He denies access to a gun. He reports poor appetite and denies being depressed.   Patient reports being in a recovery program in the past and currently a member of AA.  He also reported completing the IOP program here at the [facility].   He completed the PHQ-9 questionnaire and obtained a total score of 5, indicating mild depression.  On evaluation, patient is alert, oriented x 4, and cooperative. Speech is clear and coherent.  Pt appears well groomed. Eye contact is good. Mood is anxious, affect is congruent with mood. Thought process is coherent and goal directed and thought content is WDL. Pt denies SI/HI/AVH. There is no objective indication that the patient is responding to internal stimuli. No delusions elicited during this assessment.   Discussed recommendation for admission to the [facility] for alcohol  detox/treatment when appropriate bed available. Patient admitted to the continuous observation unit for safety monitoring/detox treatment pending inpatient/FBC bed availability. Patient is provided with opportunity for questions.  He verbalized understanding and is in agreement.  Total Time spent with patient: 30 minutes  Musculoskeletal  Strength & Muscle Tone: within normal limits Gait & Station: normal Patient leans: N/A  Psychiatric Specialty Exam  Presentation General Appearance:  Appropriate for Environment  Eye Contact: Good  Speech: Clear and Coherent  Speech Volume: Normal  Handedness: Right   Mood and Affect  Mood: Anxious  Affect: Congruent   Thought Process  Thought Processes: Coherent; Goal Directed  Descriptions of Associations:Intact  Orientation:Full (Time, Place and Person)  Thought Content:WDL  Diagnosis of Schizophrenia or Schizoaffective disorder in past: No   Hallucinations:Hallucinations: None  Ideas of Reference:None  Suicidal Thoughts:Suicidal Thoughts: No  Homicidal Thoughts:Homicidal Thoughts: No   Sensorium  Memory: Immediate Good  Judgment: Intact  Insight: Present   Executive Functions  Concentration: Good  Attention Span: Good  Recall: Fair  Fund of Knowledge: Good  Language: Good   Psychomotor Activity  Psychomotor Activity: Psychomotor Activity: Normal   Assets  Assets: Communication Skills; Desire for Improvement  Sleep  Sleep: Sleep: Poor   Nutritional Assessment (For OBS and FBC admissions only) Has the  patient had a weight loss or gain of 10 pounds or more in the last 3 months?: No Has the patient had a decrease in food intake/or appetite?: No Does the patient have dental problems?: No Does the patient have eating habits or behaviors that may be indicators of an eating disorder including binging or inducing vomiting?: No Has the patient recently lost weight without trying?: 0 Has the patient been eating poorly because of a decreased appetite?: 0 Malnutrition Screening Tool Score: 0    Physical Exam Constitutional:      Appearance: He is not toxic-appearing or diaphoretic.  HENT:     Nose: No congestion.  Cardiovascular:     Rate and Rhythm: Normal rate.  Pulmonary:     Effort: No respiratory distress.  Chest:     Chest wall: No tenderness.  Neurological:     Mental Status: He is alert and oriented to person, place, and time.  Psychiatric:        Attention and Perception: Attention and perception normal.        Mood and Affect: Mood is anxious. Affect is blunt.        Speech: Speech normal.        Behavior: Behavior is cooperative.        Thought Content: Thought content normal.        Cognition and Memory: Cognition and memory normal.    Review of Systems  Constitutional:  Negative for chills, diaphoresis and fever.  HENT:  Negative for congestion.   Eyes:  Negative for discharge.  Respiratory:  Negative for cough, shortness of breath and wheezing.   Cardiovascular:  Negative for chest pain and palpitations.  Gastrointestinal:  Negative for diarrhea, nausea and vomiting.  Neurological:  Negative for dizziness, seizures, weakness and headaches.  Psychiatric/Behavioral:  Positive for substance abuse. The patient is nervous/anxious.     Blood pressure 110/77, pulse 76, temperature 98.4 F (36.9 C), temperature source Oral, resp. rate 16, SpO2 99%. There is no height or weight on file to calculate BMI.  Past Psychiatric History: See H & P   Is the patient at risk to  self? Yes  Has the patient been a risk to self in the past 6 months? No .    Has the patient been a risk to self within the distant past? No   Is the patient a risk to others? No   Has the patient been a risk to others in the past 6 months? No   Has the patient been a risk to others within the distant past? No   Past Medical History: See Chart  Family History: N/A  Social History: N/A  Last Labs:  Admission on 01/24/2024  Component Date Value Ref Range Status   WBC 01/24/2024 6.1  4.0 - 10.5 K/uL Final   RBC 01/24/2024 4.92  4.22 - 5.81 MIL/uL Final   Hemoglobin 01/24/2024 13.9  13.0 - 17.0 g/dL Final   HCT 92/71/7974 41.2  39.0 - 52.0 % Final   MCV 01/24/2024 83.7  80.0 - 100.0 fL Final   MCH 01/24/2024 28.3  26.0 - 34.0 pg Final   MCHC 01/24/2024 33.7  30.0 - 36.0 g/dL Final   RDW 92/71/7974 13.2  11.5 - 15.5 % Final   Platelets 01/24/2024 261  150 - 400 K/uL Final   nRBC 01/24/2024 0.0  0.0 - 0.2 % Final  Neutrophils Relative % 01/24/2024 44  % Final   Neutro Abs 01/24/2024 2.7  1.7 - 7.7 K/uL Final   Lymphocytes Relative 01/24/2024 40  % Final   Lymphs Abs 01/24/2024 2.4  0.7 - 4.0 K/uL Final   Monocytes Relative 01/24/2024 7  % Final   Monocytes Absolute 01/24/2024 0.4  0.1 - 1.0 K/uL Final   Eosinophils Relative 01/24/2024 8  % Final   Eosinophils Absolute 01/24/2024 0.5  0.0 - 0.5 K/uL Final   Basophils Relative 01/24/2024 1  % Final   Basophils Absolute 01/24/2024 0.1  0.0 - 0.1 K/uL Final   Immature Granulocytes 01/24/2024 0  % Final   Abs Immature Granulocytes 01/24/2024 0.02  0.00 - 0.07 K/uL Final   Performed at [facility], [address] [place], [place] [address]   Sodium 01/24/2024 136  135 - 145 mmol/L Final   Potassium 01/24/2024 4.1  3.5 - 5.1 mmol/L Final   Chloride 01/24/2024 104  98 - 111 mmol/L Final   CO2 01/24/2024 21 (L)  22 - 32 mmol/L Final   Glucose, Bld 01/24/2024 213 (H)  70 - 99 mg/dL Final   Glucose reference range applies only to  samples taken after fasting for at least 8 hours.   BUN 01/24/2024 9  8 - 23 mg/dL Final   Creatinine, Ser 01/24/2024 0.83  0.61 - 1.24 mg/dL Final   Calcium  01/24/2024 9.3  8.9 - 10.3 mg/dL Final   Total Protein 92/71/7974 6.5  6.5 - 8.1 g/dL Final   Albumin 92/71/7974 4.1  3.5 - 5.0 g/dL Final   AST 92/71/7974 23  15 - 41 U/L Final   ALT 01/24/2024 36  0 - 44 U/L Final   Alkaline Phosphatase 01/24/2024 61  38 - 126 U/L Final   Total Bilirubin 01/24/2024 0.7  0.0 - 1.2 mg/dL Final   GFR, Estimated 01/24/2024 >60  >60 mL/min Final   Comment: (NOTE) Calculated using the CKD-EPI Creatinine Equation (2021)    Anion gap 01/24/2024 11  5 - 15 Final   Performed at [facility], [address], [place], [place] [address]   Alcohol , Ethyl (B) 01/24/2024 <15  <15 mg/dL Final   Comment: (NOTE) For medical purposes only. Performed at [facility], [address] [place], [place] [address]    POC Amphetamine UR 01/24/2024 None Detected  NONE DETECTED (Cut Off Level 1000 ng/mL) Final   POC Secobarbital (BAR) 01/24/2024 None Detected  NONE DETECTED (Cut Off Level 300 ng/mL) Final   POC Buprenorphine (BUP) 01/24/2024 None Detected  NONE DETECTED (Cut Off Level 10 ng/mL) Final   POC Oxazepam (BZO) 01/24/2024 None Detected  NONE DETECTED (Cut Off Level 300 ng/mL) Final   POC Cocaine UR 01/24/2024 None Detected  NONE DETECTED (Cut Off Level 300 ng/mL) Final   POC Methamphetamine UR 01/24/2024 None Detected  NONE DETECTED (Cut Off Level 1000 ng/mL) Final   POC Morphine  01/24/2024 None Detected  NONE DETECTED (Cut Off Level 300 ng/mL) Final   POC Methadone UR 01/24/2024 None Detected  NONE DETECTED (Cut Off Level 300 ng/mL) Final   POC Oxycodone  UR 01/24/2024 None Detected  NONE DETECTED (Cut Off Level 100 ng/mL) Final   POC Marijuana UR 01/24/2024 None Detected  NONE DETECTED (Cut Off Level 50 ng/mL) Final   Glucose-Capillary 01/24/2024 217 (H)  70 - 99 mg/dL Final   Glucose reference  range applies only to samples taken after fasting for at least 8 hours.  Office Visit on 12/23/2023  Component Date Value Ref Range Status   Creatinine, Urine 12/23/2023 225.4  Not Estab. mg/dL Final   Microalbumin, Urine 12/23/2023 20.3  Not Estab. ug/mL Final   Microalb/Creat Ratio 12/23/2023 9  0 - 29 mg/g creat Final   Comment:                        Normal:                0 -  29                        Moderately increased: 30 - 300                        Severely increased:       >300    Hemoglobin A1C 12/23/2023 7.2 (A)  4.0 - 5.6 % Final   WBC 12/23/2023 5.4  3.4 - 10.8 x10E3/uL Final   RBC 12/23/2023 5.57  4.14 - 5.80 x10E6/uL Final   Hemoglobin 12/23/2023 15.4  13.0 - 17.7 g/dL Final   Hematocrit 93/73/7974 48.4  37.5 - 51.0 % Final   MCV 12/23/2023 87  79 - 97 fL Final   MCH 12/23/2023 27.6  26.6 - 33.0 pg Final   MCHC 12/23/2023 31.8  31.5 - 35.7 g/dL Final   RDW 93/73/7974 13.0  11.6 - 15.4 % Final   Platelets 12/23/2023 248  150 - 450 x10E3/uL Final   Glucose 12/23/2023 140 (H)  70 - 99 mg/dL Final   BUN 93/73/7974 20  8 - 27 mg/dL Final   Creatinine, Ser 12/23/2023 1.06  0.76 - 1.27 mg/dL Final   eGFR 93/73/7974 79  >59 mL/min/1.73 Final   BUN/Creatinine Ratio 12/23/2023 19  10 - 24 Final   Sodium 12/23/2023 137  134 - 144 mmol/L Final   Potassium 12/23/2023 4.7  3.5 - 5.2 mmol/L Final   Chloride 12/23/2023 101  96 - 106 mmol/L Final   CO2 12/23/2023 18 (L)  20 - 29 mmol/L Final   Calcium  12/23/2023 10.3 (H)  8.6 - 10.2 mg/dL Final   Total Protein 93/73/7974 7.2  6.0 - 8.5 g/dL Final   Albumin 93/73/7974 4.9  3.9 - 4.9 g/dL Final   Globulin, Total 12/23/2023 2.3  1.5 - 4.5 g/dL Final   Bilirubin Total 12/23/2023 0.7  0.0 - 1.2 mg/dL Final   Alkaline Phosphatase 12/23/2023 84  44 - 121 IU/L Final   AST 12/23/2023 50 (H)  0 - 40 IU/L Final   ALT 12/23/2023 61 (H)  0 - 44 IU/L Final   Cholesterol, Total 12/23/2023 156  100 - 199 mg/dL Final   Triglycerides 93/73/7974  109  0 - 149 mg/dL Final   HDL 93/73/7974 40  >39 mg/dL Final   VLDL Cholesterol Cal 12/23/2023 20  5 - 40 mg/dL Final   LDL Chol Calc (NIH) 12/23/2023 96  0 - 99 mg/dL Final   Chol/HDL Ratio 12/23/2023 3.9  0.0 - 5.0 ratio Final   Comment:                                   T. Chol/HDL Ratio  Men  Women                               1/2 Avg.Risk  3.4    3.3                                   Avg.Risk  5.0    4.4                                2X Avg.Risk  9.6    7.1                                3X Avg.Risk 23.4   11.0    Prostate Specific Ag, Serum 12/23/2023 1.0  0.0 - 4.0 ng/mL Final   Comment: Roche ECLIA methodology. According to the American Urological Association, Serum PSA should decrease and remain at undetectable levels after radical prostatectomy. The AUA defines biochemical recurrence as an initial PSA value 0.2 ng/mL or greater followed by a subsequent confirmatory PSA value 0.2 ng/mL or greater. Values obtained with different assay methods or kits cannot be used interchangeably. Results cannot be interpreted as absolute evidence of the presence or absence of malignant disease.   Office Visit on 09/23/2023  Component Date Value Ref Range Status   Specific Gravity, UA 09/23/2023 1.025  1.005 - 1.030 Final   pH, UA 09/23/2023 5.5  5.0 - 7.5 Final   Color, UA 09/23/2023 Yellow  Yellow Final   Appearance Ur 09/23/2023 Clear  Clear Final   Leukocytes,UA 09/23/2023 Negative  Negative Final   Protein,UA 09/23/2023 1+ (A)  Negative/Trace Final   Glucose, UA 09/23/2023 Negative  Negative Final   Ketones, UA 09/23/2023 Negative  Negative Final   RBC, UA 09/23/2023 Negative  Negative Final   Bilirubin, UA 09/23/2023 Negative  Negative Final   Urobilinogen, Ur 09/23/2023 0.2  0.2 - 1.0 mg/dL Final   Nitrite, UA 96/72/7974 Negative  Negative Final   Microscopic Examination 09/23/2023 See below:   Final   WBC, UA 09/23/2023 0-5  0 - 5  /hpf Final   RBC, Urine 09/23/2023 0-2  0 - 2 /hpf Final   Epithelial Cells (non renal) 09/23/2023 0-10  0 - 10 /hpf Final   Casts 09/23/2023 Present  None seen /lpf Final   Cast Type 09/23/2023 Hyaline casts  N/A Final   Bacteria, UA 09/23/2023 Few  None seen/Few Final  Office Visit on 08/27/2023  Component Date Value Ref Range Status   Hemoglobin A1C 08/27/2023 7.1 (A)  4.0 - 5.6 % Final  Office Visit on 08/26/2023  Component Date Value Ref Range Status   Specific Gravity, UA 08/26/2023 1.025  1.005 - 1.030 Final   pH, UA 08/26/2023 5.5  5.0 - 7.5 Final   Color, UA 08/26/2023 Orange  Yellow Final   Appearance Ur 08/26/2023 Clear  Clear Final   Leukocytes,UA 08/26/2023 Negative  Negative Final   Protein,UA 08/26/2023 1+ (A)  Negative/Trace Final   Glucose, UA 08/26/2023 Negative  Negative Final   Ketones, UA 08/26/2023 Trace (A)  Negative Final   RBC, UA 08/26/2023 Negative  Negative Final   Bilirubin, UA 08/26/2023 Negative  Negative Final   Urobilinogen, Ur 08/26/2023 1.0  0.2 - 1.0 mg/dL Final  Nitrite, UA 08/26/2023 Negative  Negative Final   Microscopic Examination 08/26/2023 See below:   Final   WBC, UA 08/26/2023 0-5  0 - 5 /hpf Final   RBC, Urine 08/26/2023 0-2  0 - 2 /hpf Final   Epithelial Cells (non renal) 08/26/2023 0-10  0 - 10 /hpf Final   Casts 08/26/2023 Present  None seen /lpf Final   Cast Type 08/26/2023 Hyaline casts  N/A Final   Mucus, UA 08/26/2023 Present (A)  Not Estab. Final   Bacteria, UA 08/26/2023 Few  None seen/Few Final  Admission on 08/13/2023, Discharged on 08/13/2023  Component Date Value Ref Range Status   Sodium 08/13/2023 137  135 - 145 mmol/L Final   Potassium 08/13/2023 4.5  3.5 - 5.1 mmol/L Final   Chloride 08/13/2023 107  98 - 111 mmol/L Final   CO2 08/13/2023 22  22 - 32 mmol/L Final   Glucose, Bld 08/13/2023 137 (H)  70 - 99 mg/dL Final   Glucose reference range applies only to samples taken after fasting for at least 8 hours.   BUN  08/13/2023 13  8 - 23 mg/dL Final   Creatinine, Ser 08/13/2023 1.02  0.61 - 1.24 mg/dL Final   Calcium  08/13/2023 9.3  8.9 - 10.3 mg/dL Final   Total Protein 97/85/7974 7.0  6.5 - 8.1 g/dL Final   Albumin 97/85/7974 4.3  3.5 - 5.0 g/dL Final   AST 97/85/7974 23  15 - 41 U/L Final   ALT 08/13/2023 32  0 - 44 U/L Final   Alkaline Phosphatase 08/13/2023 46  38 - 126 U/L Final   Total Bilirubin 08/13/2023 0.6  0.0 - 1.2 mg/dL Final   GFR, Estimated 08/13/2023 >60  >60 mL/min Final   Comment: (NOTE) Calculated using the CKD-EPI Creatinine Equation (2021)    Anion gap 08/13/2023 8  5 - 15 Final   Performed at [facility], [address], [place], [place] [address]   WBC 08/13/2023 5.8  4.0 - 10.5 K/uL Final   RBC 08/13/2023 5.35  4.22 - 5.81 MIL/uL Final   Hemoglobin 08/13/2023 14.8  13.0 - 17.0 g/dL Final   HCT 97/85/7974 45.0  39.0 - 52.0 % Final   MCV 08/13/2023 84.1  80.0 - 100.0 fL Final   MCH 08/13/2023 27.7  26.0 - 34.0 pg Final   MCHC 08/13/2023 32.9  30.0 - 36.0 g/dL Final   RDW 97/85/7974 12.8  11.5 - 15.5 % Final   Platelets 08/13/2023 219  150 - 400 K/uL Final   nRBC 08/13/2023 0.0  0.0 - 0.2 % Final   Performed at [facility], [address] [place], [place] [address]   Color, Urine 08/13/2023 AMBER (A)  YELLOW Final   BIOCHEMICALS MAY BE AFFECTED BY COLOR   APPearance 08/13/2023 HAZY (A)  CLEAR Final   Specific Gravity, Urine 08/13/2023 1.027  1.005 - 1.030 Final   pH 08/13/2023 5.0  5.0 - 8.0 Final   Glucose, UA 08/13/2023 NEGATIVE  NEGATIVE mg/dL Final   Hgb urine dipstick 08/13/2023 NEGATIVE  NEGATIVE Final   Bilirubin Urine 08/13/2023 NEGATIVE  NEGATIVE Final   Ketones, ur 08/13/2023 NEGATIVE  NEGATIVE mg/dL Final   Protein, ur 97/85/7974 30 (A)  NEGATIVE mg/dL Final   Nitrite 97/85/7974 NEGATIVE  NEGATIVE Final   Leukocytes,Ua 08/13/2023 NEGATIVE  NEGATIVE Final   RBC / HPF 08/13/2023 0-5  0 - 5 RBC/hpf Final   WBC, UA 08/13/2023 0-5  0 - 5 WBC/hpf  Final   Bacteria, UA 08/13/2023 NONE SEEN  NONE SEEN Final   Squamous Epithelial / HPF 08/13/2023 0-5  0 - 5 /HPF Final   Mucus 08/13/2023 PRESENT   Final   Hyaline Casts, UA 08/13/2023 PRESENT   Final   Ca Oxalate Crys, UA 08/13/2023 PRESENT   Final   Performed at [facility], [address], [place], [place] [address]   Neisseria Gonorrhea 08/13/2023 Negative   Final   Chlamydia 08/13/2023 Negative   Final   Comment 08/13/2023 Normal Reference Ranger Chlamydia - Negative   Final   Comment 08/13/2023 Normal Reference Range Neisseria Gonorrhea - Negative   Final    Allergies: Bee venom, Gadolinium, Shellfish allergy, Statins, and Testosterone  cypionate  Medications:  Facility Ordered Medications  Medication   acetaminophen  (TYLENOL ) tablet 650 mg   alum & mag hydroxide-simeth (MAALOX/MYLANTA) 200-200-20 MG/5ML suspension 30 mL   magnesium  hydroxide (MILK OF MAGNESIA) suspension 30 mL   [COMPLETED] thiamine  (VITAMIN B1) injection 100 mg   multivitamin with minerals tablet 1 tablet   chlordiazePOXIDE  (LIBRIUM ) capsule 25 mg   hydrOXYzine  (ATARAX ) tablet 25 mg   loperamide  (IMODIUM ) capsule 2-4 mg   ondansetron  (ZOFRAN -ODT) disintegrating tablet 4 mg   OLANZapine  zydis (ZYPREXA ) disintegrating tablet 5 mg   OLANZapine  (ZYPREXA ) injection 5 mg   chlordiazePOXIDE  (LIBRIUM ) capsule 25 mg   Followed by   NOREEN ON 01/26/2024] chlordiazePOXIDE  (LIBRIUM ) capsule 25 mg   Followed by   NOREEN ON 01/27/2024] chlordiazePOXIDE  (LIBRIUM ) capsule 25 mg   Followed by   NOREEN ON 01/28/2024] chlordiazePOXIDE  (LIBRIUM ) capsule 25 mg   traZODone  (DESYREL ) tablet 50 mg   aspirin  EC tablet 81 mg   clopidogrel  (PLAVIX ) tablet 75 mg   ezetimibe  (ZETIA ) tablet 10 mg   metoprolol  tartrate (LOPRESSOR ) tablet 50 mg   metFORMIN  (GLUCOPHAGE ) tablet 500 mg   PTA Medications  Medication Sig   EPINEPHrine  0.3 mg/0.3 mL IJ SOAJ injection Inject 0.3 mg into the muscle as needed for anaphylaxis.    nitroGLYCERIN  (NITROSTAT ) 0.4 MG SL tablet Place 1 tablet (0.4 mg total) under the tongue every 5 (five) minutes x 3 doses as needed for chest pain.   Polyethyl Glycol-Propyl Glycol (SYSTANE) 0.4-0.3 % GEL ophthalmic gel Place 1 Application into both eyes daily as needed (For dry eyes).   Cholecalciferol  25 MCG (1000 UT) tablet Take 1,000 Units by mouth daily.   Multiple Vitamin (MULTIVITAMIN WITH MINERALS) TABS tablet Take 1 tablet by mouth daily.   LIVALO  4 MG TABS Take 1 tablet by mouth once daily   amLODipine  (NORVASC ) 10 MG tablet Take 1 tablet (10 mg total) by mouth daily.   ezetimibe  (ZETIA ) 10 MG tablet Take 1 tablet (10 mg total) by mouth daily.   aspirin  EC 81 MG tablet Take by mouth.   diphenhydrAMINE  (BENADRYL ) 50 MG tablet Take 1 tablet by mouth 1 hour before MRI   clopidogrel  (PLAVIX ) 75 MG tablet Take 1 tablet by mouth once daily   fluticasone  (FLONASE ) 50 MCG/ACT nasal spray Place 2 sprays into both nostrils daily.   metoprolol  tartrate (LOPRESSOR ) 50 MG tablet Take 1 tablet (50 mg total) by mouth 2 (two) times daily.   cyclobenzaprine  (FLEXERIL ) 10 MG tablet Take 1 tablet (10 mg total) by mouth 3 (three) times daily as needed for muscle spasms.   busPIRone  (BUSPAR ) 10 MG tablet Take 1 tablet (10 mg total) by mouth 3 (three) times daily.   ACCU-CHEK GUIDE TEST test strip USE 1 STRIP TO  CHECK GLUCOSE THREE TIMES DAILY AS DIRECTED    Screenings    Flowsheet Row Most Recent Value  CIWA-Ar Total 0    Medical Decision Making  Recommend admission to the [facility] for substance abuse treatment/alcohol  detox when bed available. Patient admitted to the continuous observation unit for safety monitoring/detox treatment.  Recommend CIWA protocol  Home Medications continued -Aspirin , Plavix ,Zetia , metformin , Lopressor ,  Other PRNs -Tylenol , trazodone , MOM, Maalox --Agitation protocol medications   Recommendations  Based on my evaluation the patient does not appear to have an  emergency medical condition.  Recommend admission to the [facility] for substance abuse treatment/alcohol  detox.   Thurman LULLA Ivans, NP 01/25/24  2:51 AM

## 2024-01-24 NOTE — ED Notes (Signed)
 Patient currently sleeping and resting in recliner. RR even and unlabored, appearing in no noted distress. Environmental check complete

## 2024-01-25 ENCOUNTER — Other Ambulatory Visit: Payer: Self-pay

## 2024-01-25 ENCOUNTER — Other Ambulatory Visit (HOSPITAL_COMMUNITY)
Admission: EM | Admit: 2024-01-25 | Discharge: 2024-01-29 | Disposition: A | Discharge status: Home / Self Care | Payer: MEDICAID | Attending: Family Medicine | Admitting: Family Medicine

## 2024-01-25 DIAGNOSIS — Z7982 Long term (current) use of aspirin: Secondary | ICD-10-CM | POA: Insufficient documentation

## 2024-01-25 DIAGNOSIS — I252 Old myocardial infarction: Secondary | ICD-10-CM | POA: Diagnosis not present

## 2024-01-25 DIAGNOSIS — E119 Type 2 diabetes mellitus without complications: Secondary | ICD-10-CM | POA: Insufficient documentation

## 2024-01-25 DIAGNOSIS — Z794 Long term (current) use of insulin: Secondary | ICD-10-CM | POA: Insufficient documentation

## 2024-01-25 DIAGNOSIS — Z7902 Long term (current) use of antithrombotics/antiplatelets: Secondary | ICD-10-CM | POA: Diagnosis not present

## 2024-01-25 DIAGNOSIS — I2510 Atherosclerotic heart disease of native coronary artery without angina pectoris: Secondary | ICD-10-CM | POA: Insufficient documentation

## 2024-01-25 DIAGNOSIS — I10 Essential (primary) hypertension: Secondary | ICD-10-CM | POA: Insufficient documentation

## 2024-01-25 DIAGNOSIS — F1020 Alcohol dependence, uncomplicated: Secondary | ICD-10-CM

## 2024-01-25 DIAGNOSIS — Z87891 Personal history of nicotine dependence: Secondary | ICD-10-CM | POA: Insufficient documentation

## 2024-01-25 DIAGNOSIS — F411 Generalized anxiety disorder: Secondary | ICD-10-CM | POA: Insufficient documentation

## 2024-01-25 DIAGNOSIS — Z79899 Other long term (current) drug therapy: Secondary | ICD-10-CM | POA: Insufficient documentation

## 2024-01-25 DIAGNOSIS — Z7984 Long term (current) use of oral hypoglycemic drugs: Secondary | ICD-10-CM | POA: Insufficient documentation

## 2024-01-25 DIAGNOSIS — E785 Hyperlipidemia, unspecified: Secondary | ICD-10-CM | POA: Insufficient documentation

## 2024-01-25 DIAGNOSIS — M549 Dorsalgia, unspecified: Secondary | ICD-10-CM | POA: Insufficient documentation

## 2024-01-25 DIAGNOSIS — Z8673 Personal history of transient ischemic attack (TIA), and cerebral infarction without residual deficits: Secondary | ICD-10-CM | POA: Insufficient documentation

## 2024-01-25 LAB — CBC WITH DIFFERENTIAL/PLATELET
Abs Immature Granulocytes: 0.02 K/uL (ref 0.00–0.07)
Basophils Absolute: 0.1 K/uL (ref 0.0–0.1)
Basophils Relative: 1 %
Eosinophils Absolute: 0.5 K/uL (ref 0.0–0.5)
Eosinophils Relative: 8 %
HCT: 41.2 % (ref 39.0–52.0)
Hemoglobin: 13.9 g/dL (ref 13.0–17.0)
Immature Granulocytes: 0 %
Lymphocytes Relative: 40 %
Lymphs Abs: 2.4 K/uL (ref 0.7–4.0)
MCH: 28.3 pg (ref 26.0–34.0)
MCHC: 33.7 g/dL (ref 30.0–36.0)
MCV: 83.7 fL (ref 80.0–100.0)
Monocytes Absolute: 0.4 K/uL (ref 0.1–1.0)
Monocytes Relative: 7 %
Neutro Abs: 2.7 K/uL (ref 1.7–7.7)
Neutrophils Relative %: 44 %
Platelets: 261 K/uL (ref 150–400)
RBC: 4.92 MIL/uL (ref 4.22–5.81)
RDW: 13.2 % (ref 11.5–15.5)
WBC: 6.1 K/uL (ref 4.0–10.5)
nRBC: 0 % (ref 0.0–0.2)

## 2024-01-25 LAB — ETHANOL: Alcohol, Ethyl (B): <15 mg/dL (ref <15)

## 2024-01-25 LAB — COMPREHENSIVE METABOLIC PANEL WITH GFR
ALT: 36 U/L (ref 0–44)
AST: 23 U/L (ref 15–41)
Albumin: 4.1 g/dL (ref 3.5–5.0)
Alkaline Phosphatase: 61 U/L (ref 38–126)
Anion gap: 11 (ref 5–15)
BUN: 9 mg/dL (ref 8–23)
CO2: 21 mmol/L — ABNORMAL LOW (ref 22–32)
Calcium: 9.3 mg/dL (ref 8.9–10.3)
Chloride: 104 mmol/L (ref 98–111)
Creatinine, Ser: 0.83 mg/dL (ref 0.61–1.24)
GFR, Estimated: >60 mL/min (ref >60)
Glucose, Bld: 213 mg/dL — ABNORMAL HIGH (ref 70–99)
Potassium: 4.1 mmol/L (ref 3.5–5.1)
Sodium: 136 mmol/L (ref 135–145)
Total Bilirubin: 0.7 mg/dL (ref 0.0–1.2)
Total Protein: 6.5 g/dL (ref 6.5–8.1)

## 2024-01-25 LAB — GLUCOSE, CAPILLARY: Glucose-Capillary: 150 mg/dL — ABNORMAL HIGH (ref 70–99)

## 2024-01-25 MED ORDER — ALUM & MAG HYDROXIDE-SIMETH 200-200-20 MG/5ML PO SUSP: 30.0000 mL | ORAL | Status: DC | PRN

## 2024-01-25 MED ORDER — MAGNESIUM HYDROXIDE 400 MG/5ML PO SUSP
30.0000 mL | Freq: Every day | ORAL | Status: DC | PRN
  Administered 2024-01-26: 30 mL via ORAL
  Filled 2024-01-25: qty 30

## 2024-01-25 MED ORDER — OLANZAPINE 10 MG IM SOLR: 5.0000 mg | Freq: Three times a day (TID) | INTRAMUSCULAR | Status: DC | PRN

## 2024-01-25 MED ORDER — LOPERAMIDE HCL 2 MG PO CAPS: 2.0000 mg | ORAL_CAPSULE | ORAL | Status: AC | PRN

## 2024-01-25 MED ORDER — HYDROXYZINE HCL 25 MG PO TABS: 25.0000 mg | ORAL_TABLET | Freq: Four times a day (QID) | ORAL | Status: DC | PRN

## 2024-01-25 MED ORDER — CHLORDIAZEPOXIDE HCL 25 MG PO CAPS
25.0000 mg | ORAL_CAPSULE | Freq: Every day | ORAL | Status: AC
  Administered 2024-01-28: 25 mg via ORAL
  Filled 2024-01-25: qty 1

## 2024-01-25 MED ORDER — GLUCERNA SHAKE PO LIQD
237.0000 mL | Freq: Two times a day (BID) | ORAL | Status: DC | PRN
  Administered 2024-01-26: 237 mL via ORAL
  Filled 2024-01-25: qty 237

## 2024-01-25 MED ORDER — CHLORDIAZEPOXIDE HCL 25 MG PO CAPS
25.0000 mg | ORAL_CAPSULE | Freq: Four times a day (QID) | ORAL | Status: AC
  Administered 2024-01-25 – 2024-01-26 (×3): 25 mg via ORAL
  Filled 2024-01-25 (×3): qty 1

## 2024-01-25 MED ORDER — CHLORDIAZEPOXIDE HCL 25 MG PO CAPS: 25.0000 mg | ORAL_CAPSULE | Freq: Four times a day (QID) | ORAL | Status: AC | PRN

## 2024-01-25 MED ORDER — METFORMIN HCL 500 MG PO TABS
500.0000 mg | ORAL_TABLET | Freq: Two times a day (BID) | ORAL | Status: DC
  Administered 2024-01-25: 500 mg via ORAL
  Filled 2024-01-25: qty 1

## 2024-01-25 MED ORDER — ACETAMINOPHEN 325 MG PO TABS
650.0000 mg | ORAL_TABLET | Freq: Four times a day (QID) | ORAL | Status: DC | PRN
  Administered 2024-01-26 – 2024-01-27 (×2): 650 mg via ORAL
  Filled 2024-01-25 (×2): qty 2

## 2024-01-25 MED ORDER — ASPIRIN 81 MG PO TBEC
81.0000 mg | DELAYED_RELEASE_TABLET | Freq: Every day | ORAL | Status: DC
  Administered 2024-01-26 – 2024-01-29 (×4): 81 mg via ORAL
  Filled 2024-01-25 (×4): qty 1

## 2024-01-25 MED ORDER — ONDANSETRON 4 MG PO TBDP: 4.0000 mg | ORAL_TABLET | Freq: Four times a day (QID) | ORAL | Status: AC | PRN

## 2024-01-25 MED ORDER — METOPROLOL TARTRATE 50 MG PO TABS
50.0000 mg | ORAL_TABLET | Freq: Two times a day (BID) | ORAL | Status: DC
  Administered 2024-01-25 – 2024-01-29 (×8): 50 mg via ORAL
  Filled 2024-01-25 (×8): qty 1

## 2024-01-25 MED ORDER — CHLORDIAZEPOXIDE HCL 25 MG PO CAPS
25.0000 mg | ORAL_CAPSULE | Freq: Three times a day (TID) | ORAL | Status: AC
  Administered 2024-01-26 – 2024-01-27 (×3): 25 mg via ORAL
  Filled 2024-01-25 (×3): qty 1

## 2024-01-25 MED ORDER — CHLORDIAZEPOXIDE HCL 25 MG PO CAPS
25.0000 mg | ORAL_CAPSULE | ORAL | Status: AC
  Administered 2024-01-27 – 2024-01-28 (×2): 25 mg via ORAL
  Filled 2024-01-25 (×2): qty 1

## 2024-01-25 MED ORDER — OLANZAPINE 5 MG PO TBDP: 5.0000 mg | ORAL_TABLET | Freq: Three times a day (TID) | ORAL | Status: DC | PRN

## 2024-01-25 MED ORDER — ADULT MULTIVITAMIN W/MINERALS CH
1.0000 | ORAL_TABLET | Freq: Every day | ORAL | Status: DC
  Administered 2024-01-26 – 2024-01-29 (×4): 1 via ORAL
  Filled 2024-01-25 (×4): qty 1

## 2024-01-25 MED ORDER — INSULIN ASPART 100 UNIT/ML IJ SOLN
0.0000 [IU] | Freq: Three times a day (TID) | INTRAMUSCULAR | Status: DC
  Administered 2024-01-26 (×2): 0 [IU] via SUBCUTANEOUS

## 2024-01-25 MED ORDER — HYDROXYZINE HCL 25 MG PO TABS
25.0000 mg | ORAL_TABLET | Freq: Three times a day (TID) | ORAL | Status: DC | PRN
  Administered 2024-01-25 – 2024-01-27 (×2): 25 mg via ORAL
  Filled 2024-01-25 (×2): qty 1

## 2024-01-25 MED ORDER — EZETIMIBE 10 MG PO TABS
10.0000 mg | ORAL_TABLET | Freq: Every day | ORAL | Status: DC
  Administered 2024-01-26 – 2024-01-29 (×4): 10 mg via ORAL
  Filled 2024-01-25 (×4): qty 1

## 2024-01-25 MED ORDER — METFORMIN HCL 500 MG PO TABS
500.0000 mg | ORAL_TABLET | Freq: Two times a day (BID) | ORAL | Status: DC
  Administered 2024-01-26 – 2024-01-29 (×7): 500 mg via ORAL
  Filled 2024-01-25 (×7): qty 1

## 2024-01-25 MED ORDER — CLOPIDOGREL BISULFATE 75 MG PO TABS
75.0000 mg | ORAL_TABLET | Freq: Every day | ORAL | Status: DC
  Administered 2024-01-26 – 2024-01-29 (×4): 75 mg via ORAL
  Filled 2024-01-25 (×4): qty 1

## 2024-01-25 MED ORDER — AMLODIPINE BESYLATE 10 MG PO TABS
10.0000 mg | ORAL_TABLET | Freq: Every day | ORAL | Status: DC
  Administered 2024-01-25 – 2024-01-29 (×5): 10 mg via ORAL
  Filled 2024-01-25 (×5): qty 1

## 2024-01-25 MED ORDER — NITROGLYCERIN 0.4 MG SL SUBL: 0.4000 mg | SUBLINGUAL_TABLET | SUBLINGUAL | Status: DC | PRN

## 2024-01-25 MED ORDER — TRAZODONE HCL 50 MG PO TABS
50.0000 mg | ORAL_TABLET | Freq: Every evening | ORAL | Status: DC | PRN
  Administered 2024-01-26 – 2024-01-27 (×2): 50 mg via ORAL
  Filled 2024-01-25 (×2): qty 1
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
## 2024-01-25 NOTE — ED Notes (Signed)
 Patient asleep at 0600. Assessment of CIWA deferred at scheduled time until patient is awake. Patient currently sleeping and resting in recliner. RR even and unlabored, appearing in no noted distress. Environmental check complete

## 2024-01-25 NOTE — ED Notes (Signed)
 Pt alert and cooperative. Visible in activity room. Observed attending group. Appetite good. Currently watching TV and adjusting to unit well. Will continue to monitor and report changes as noted

## 2024-01-25 NOTE — Group Note (Signed)
 Group Topic: Recovery Basics  Group Date: 01/25/2024 Start Time: 2040 End Time: 2119 Facilitators: Joan Plowman B  Department: [facility]  Number of Participants: 4  Group Focus: abuse issues and activities of daily living skills Treatment Modality:  Individual Therapy Interventions utilized were leisure development Purpose: enhance coping skills and express feelings  Name: [name] Date of Birth: [birth year]  MR: [id number]    Level of Participation: PT DID NOT ATTEND GROUP Quality of Participation: cooperative Interactions with others: gave feedback Mood/Affect: appropriate Triggers (if applicable): NA Cognition: coherent/clear Progress: None Response: NA Plan: patient will be encouraged to go to groups.   Patients Problems:  Patient Active Problem List   Diagnosis Date Noted   Alcohol  use disorder, severe, dependence ([name]) 01/25/2024   Scrotal pain, right 08/26/2023   Epididymitis, right 08/26/2023   Complex renal cyst, right, 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/26/2023   Impingement syndrome of right shoulder 07/01/2023   Pain in right shoulder 06/16/2023   Anxiety disorder, unspecified 02/16/2023   Alcohol  use disorder 02/16/2023   Sleep disorder, unspecified 02/16/2023   Alcohol  abuse 02/15/2023   Alcohol  dependence ([name]) 05/16/2022   Low energy 03/06/2022   Moderate alcohol  dependence in early remission ([name]) 02/24/2022   Suicidal ideation 07/07/2021   Generalized anxiety disorder 07/07/2021   Other allergic rhinitis 09/09/2020   Adverse reaction to food, subsequent encounter 09/09/2020   History of bee sting allergy 09/09/2020   Alcohol  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) 03/19/2020   MDD (major depressive disorder), recurrent episode, severe ([name]) 03/18/2020   History of substance abuse ([name]) 01/04/2020   Severe 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 features ([name]) 12/10/2019   Tinea cruris 07/06/2019   Hemoglobin A1c less  than 7.0% 07/06/2019   Muscle strain of wrist 04/05/2019   Pain in both wrists 04/05/2019   Anxiety 04/05/2019   Prediabetes 04/05/2019   MDD (major depressive disorder) 01/12/2019   Chest pain 10/25/2018   MDD (major depressive disorder), recurrent severe, without psychosis ([name]) 08/15/2018   Influenza A 07/17/2017   Lactic acidosis    Cough    Nausea and vomiting    Severe episode of recurrent major depressive disorder, without psychotic features ([name])    MDD (major depressive disorder), single episode, severe with psychotic features ([name]) 06/27/2017   MDD (major depressive disorder), severe ([name]) 06/23/2017   Essential hypertension 01/13/2016   Unstable angina ([name])    ED (erectile dysfunction) 02/15/2014   Atypical chest pain 12/14/2011   Allergy to contrast media (used for diagnostic x-rays) 05/12/2011   Dyslipidemia, goal LDL below 70 05/09/2011   DM2 (diabetes mellitus, type 2) ([name]) 07/10/2010   CAD (coronary artery disease) 07/10/2010

## 2024-01-25 NOTE — ED Notes (Signed)
 Patient resting in lounger with eyes closed, respirations even and unlabored. Patient in no apparent acute distress. Environment secured. Safety checks in place per facility protocol.

## 2024-01-25 NOTE — ED Notes (Signed)
 Pt transferred from [facility] to [facility] or substance use, ETOH and abuse disorder(Cocaine). Pt denies si/hi/avh. Cooperative with admission process and skin check, no contraband , no lice. Oriented to unit and offered no concerns. Meal and fluids offered and accepted. Will continue to monitor and report changes as noted.

## 2024-01-25 NOTE — Group Note (Signed)
 Group Topic: Mindfulness  Group Date: 01/25/2024 Start Time: 0320 End Time: 0420 Facilitators: [place] Stephane HERO, [place]  Department: [facility]  Number of Participants: 4  Group Focus: daily focus Treatment Modality:  Psychoeducation Interventions utilized were exploration, group exercise, mental fitness, reminiscence, story telling, and support Purpose: enhance coping skills  Name: [name] Date of Birth: 1960/11/13  MR: [id number]    Level of Participation: minimal Quality of Participation: cooperative Interactions with others: quiet and guarded Mood/Affect: closed / guarded and flat Triggers (if applicable): n/a Cognition: coherent/clear and concrete Progress: Minimal Response: no feedback given but was attentive Plan: referral / recommendations  Patients Problems:  Patient Active Problem List   Diagnosis Date Noted   Scrotal pain, right 08/26/2023   Epididymitis, right 08/26/2023   Complex renal cyst, right, 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/26/2023   Impingement syndrome of right shoulder 07/01/2023   Pain in right shoulder 06/16/2023   Anxiety disorder, unspecified 02/16/2023   Alcohol  use disorder 02/16/2023   Sleep disorder, unspecified 02/16/2023   Alcohol  abuse 02/15/2023   Alcohol  dependence ([name]) 05/16/2022   Low energy 03/06/2022   Moderate alcohol  dependence in early remission ([name]) 02/24/2022   Suicidal ideation 07/07/2021   Generalized anxiety disorder 07/07/2021   Other allergic rhinitis 09/09/2020   Adverse reaction to food, subsequent encounter 09/09/2020   History of bee sting allergy 09/09/2020   Alcohol  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) 03/19/2020   MDD (major depressive disorder), recurrent episode, severe ([name]) 03/18/2020   History of substance abuse ([name]) 01/04/2020   Severe 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 features ([name]) 12/10/2019   Tinea cruris 07/06/2019   Hemoglobin A1c less than 7.0% 07/06/2019    Muscle strain of wrist 04/05/2019   Pain in both wrists 04/05/2019   Anxiety 04/05/2019   Prediabetes 04/05/2019   MDD (major depressive disorder) 01/12/2019   Chest pain 10/25/2018   MDD (major depressive disorder), recurrent severe, without psychosis ([name]) 08/15/2018   Influenza A 07/17/2017   Lactic acidosis    Cough    Nausea and vomiting    Severe episode of recurrent major depressive disorder, without psychotic features ([name])    MDD (major depressive disorder), single episode, severe with psychotic features ([name]) 06/27/2017   MDD (major depressive disorder), severe ([name]) 06/23/2017   Essential hypertension 01/13/2016   Unstable angina ([name])    ED (erectile dysfunction) 02/15/2014   Atypical chest pain 12/14/2011   Allergy to contrast media (used for diagnostic x-rays) 05/12/2011   Dyslipidemia, goal LDL below 70 05/09/2011   DM2 (diabetes mellitus, type 2) ([name]) 07/10/2010   CAD (coronary artery disease) 07/10/2010

## 2024-01-25 NOTE — Care Plan (Signed)
 Pt accepted to [facility]

## 2024-01-25 NOTE — ED Notes (Signed)
 Patient reported to nurse that patient is a diabetic. BG check completed. Notified Chinwendu, NP of patient BG. New orders placed.

## 2024-01-25 NOTE — Group Note (Signed)
 Group Topic: Wellness  Group Date: 01/25/2024 Start Time: 1400 End Time: 1430 Facilitators: Kaytlan Behrman, Cloyd SAUNDERS, RN  Department: [name]  Number of Participants: 7  Group Focus: other healthy diet Treatment Modality:  Leisure Development Interventions utilized were story telling Purpose: increase insight  Name: [name] Date of Birth: 1960-09-20  MR: [id number]    Level of Participation: active Quality of Participation: cooperative Interactions with others: gave feedback Mood/Affect: appropriate Triggers (if applicable): na Cognition: coherent/clear Progress: Minimal Response: na Plan: follow-up needed  Patients Problems:  Patient Active Problem List   Diagnosis Date Noted   Scrotal pain, right 08/26/2023   Epididymitis, right 08/26/2023   Complex renal cyst, right, 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/26/2023   Impingement syndrome of right shoulder 07/01/2023   Pain in right shoulder 06/16/2023   Anxiety disorder, unspecified 02/16/2023   Alcohol  use disorder 02/16/2023   Sleep disorder, unspecified 02/16/2023   Alcohol  abuse 02/15/2023   Alcohol  dependence ([name]) 05/16/2022   Low energy 03/06/2022   Moderate alcohol  dependence in early remission ([name]) 02/24/2022   Suicidal ideation 07/07/2021   Generalized anxiety disorder 07/07/2021   Other allergic rhinitis 09/09/2020   Adverse reaction to food, subsequent encounter 09/09/2020   History of bee sting allergy 09/09/2020   Alcohol  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) 03/19/2020   MDD (major depressive disorder), recurrent episode, severe ([name]) 03/18/2020   History of substance abuse ([name]) 01/04/2020   Severe 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 features ([name]) 12/10/2019   Tinea cruris 07/06/2019   Hemoglobin A1c less than 7.0% 07/06/2019   Muscle strain of wrist 04/05/2019   Pain in both wrists 04/05/2019   Anxiety 04/05/2019   Prediabetes 04/05/2019   MDD (major depressive  disorder) 01/12/2019   Chest pain 10/25/2018   MDD (major depressive disorder), recurrent severe, without psychosis ([name]) 08/15/2018   Influenza A 07/17/2017   Lactic acidosis    Cough    Nausea and vomiting    Severe episode of recurrent major depressive disorder, without psychotic features ([name])    MDD (major depressive disorder), single episode, severe with psychotic features ([name]) 06/27/2017   MDD (major depressive disorder), severe ([name]) 06/23/2017   Essential hypertension 01/13/2016   Unstable angina ([name])    ED (erectile dysfunction) 02/15/2014   Atypical chest pain 12/14/2011   Allergy to contrast media (used for diagnostic x-rays) 05/12/2011   Dyslipidemia, goal LDL below 70 05/09/2011   DM2 (diabetes mellitus, type 2) ([name]) 07/10/2010   CAD (coronary artery disease) 07/10/2010

## 2024-01-25 NOTE — ED Notes (Signed)
 Patient handoff report done with previous RN. Pt is AOX4, denies pain or discomfort att, denies HI/SI or AVH. Safety measures cont. Plan to tx to [facility] today.

## 2024-01-25 NOTE — ED Notes (Signed)
 [name] arrived to the [facility] for Alcohol  Problem. Patient is A&Ox4. Patient denies any suicidal ideations or homicidal ideations. Pt contracts for safety. Patient denies any auditory hallucinations, visual hallucinations, or CAH. Skin check conducted by Corean PEAK and [place], MHT. Patient oriented to the unit and provided food, beverage, and snack. Patient verbalized understanding. Patient currently resting in bed. No distress noted.

## 2024-01-25 NOTE — ED Notes (Signed)
 Patient resting in lounger with eyes open, respirations even and unlabored. Patient in no apparent acute distress. Environment secured. Safety checks in place per facility protocol.

## 2024-01-25 NOTE — ED Notes (Signed)
 Patient currently sleeping and resting in recliner. RR even and unlabored, appearing in no noted distress. Environmental check complete

## 2024-01-25 NOTE — ED Provider Notes (Signed)
 Facility Based Crisis Admission H&P  Date: 01/25/24 Patient Name: [name] MRN: [id number] Chief Complaint: I need to stop and detox from alcohol   Diagnoses:  Final diagnoses:  Alcohol  use disorder, severe, dependence ([name])  GAD (generalized anxiety disorder)    HPI:  [name], 63 year old, male with history of Alcohol  Use Order,GAD and MDD was admitted to [facility] on 01/24/2024 to the obstinate while awaiting transfer to facility based crisis for for alcohol  detox. Patient's medical history is significant for previous CVA, previous MI, hyperlipidemia, CAD, hypertension, type 2 diabetes. Patient reports long history of alcohol  dependence and use however approximately 8-1/2 months ago patient achieved sobriety and relapsed 2-1/2 weeks ago and has been drinking approximately 1 pint to 1 L of liquor daily.  Patient has been having some complications related to pain due to degenerative disk disease in his spine and has an upcoming surgery next month to repair spine and hopefully resolve pain.  Patient wanted to achieve sobriety prior to upcoming back surgery which is scheduled for February 02, 2024.  Patient reports that he is involved in AA.  Alcohol  of choice is gin.  Patient denies any other substance use.  Patient is widowed and lives alone.  Patient denies any suicidal or homicidal ideations.  Patient endorses that he has been treated in the past for anxiety and depression however reports to this writer that once he stopped abusing alcohol  the symptoms of both anxiety and depression completely resolved.  He denies any depression but endorses since his last drink which was 5:30 PM 2 days ago experiencing a mild increase in anxiety.  On admission patient was started on CIWA protocol and placed on a Librium  taper.  Patient endorses that he is continued to have shakes related to withdrawal symptoms although denies any headache, nausea, vomiting and has never had any history of DTs or seizures  related to alcohol  withdrawal.  Patient endorses that he is motivated to complete detox and maintain sobriety following this admission.  Patient previously was treated for alcohol  dependence in the [facility] program which he successfully completed.  Patient is established with Dr. Leila, in [facility] for mental health services and currently denies prescription medications for  for any psychiatric symptoms of anxiety and or depression.  Librium  taper initiated on admission and CIWA score has been less than 5 reflecting a non complicated course of alcohol  withdrawal.   Type 2 Diabetes, A1C 7.2. Patient endorses poor appetite due to concern for elevated glucose. Discussed available dietary options and low card proteuin drinks as opitons for nutrition.  Currently managed with Metformin  500 mg BID. SS/AC and HS ordered since admission to [facility]. B/S average <200.  Hypertension- BP mildly elevated, home medications of Metoprolol  50 mg BID and Amlodipine  10 mg daily prescribed for BP control. Hx of MI -ASA and Plavix  prescribed, pt followed by cardiology, [facility] at [facility] (see chart review) Hyperlipidemia-Zetia  , allergy to statin therapy.  Patient is a non smoker.  During evaluation [name] is sitting upright  in no acute distress.  He is alert, oriented x 4, calm, cooperative and attentive.  His mood is euthymic with congruent affect.  He has normal speech, and behavior.  Objectively there is no evidence of psychosis/mania or delusional thinking.  Patient is able to converse coherently, goal directed thoughts, no distractibility, or pre-occupation.  He also denies suicidal/self-harm/homicidal ideation, psychosis, and paranoia.  Patient answered question appropriately.  PHQ 2-9:  Flowsheet Row Office Visit from 12/23/2023 in [facility] - A Dept Of [facility] Office Visit from 08/27/2023 in [facility] - A Dept Of [facility] Counselor from 05/17/2023 in [facility]  Thoughts that you would be better off dead, or of hurting yourself in some way Not at all Not at all Not at all  PHQ-9 Total Score 3 0 9    Flowsheet Row ED from 01/24/2024 in [facility] from 12/10/2023 in [facility] ED from 08/13/2023 in [facility] Department at [facility]  C-SSRS RISK CATEGORY No Risk No Risk No Risk      Total Time spent with patient: 1 hour  Musculoskeletal  Strength & Muscle Tone: within normal limits Gait & Station: normal Patient leans: N/A  Psychiatric Specialty Exam  Presentation General Appearance:  Appropriate for Environment  Eye Contact: Good  Speech: Clear and Coherent  Speech Volume: Normal  Handedness: Right   Mood and Affect  Mood: Anxious  Affect: Congruent   Thought Process  Thought Processes: Coherent; Goal Directed  Descriptions of Associations:Intact  Orientation:Full (Time, Place and Person)  Thought Content:WDL  Diagnosis of Schizophrenia or Schizoaffective disorder in past: No   Hallucinations:Hallucinations: None  Ideas of Reference:None  Suicidal Thoughts:Suicidal Thoughts: No  Homicidal Thoughts:Homicidal Thoughts: No   Sensorium  Memory: Immediate Good  Judgment: Intact  Insight: Present   Executive Functions  Concentration: Good  Attention Span: Good  Recall: Fair  Fund of Knowledge: Good  Language: Good   Psychomotor Activity  Psychomotor Activity: Psychomotor Activity: Normal   Assets  Assets: Communication Skills; Desire for Improvement   Sleep  Sleep: Sleep: Poor   Nutritional Assessment (For OBS and FBC admissions only) Has the patient had a weight loss or gain of 10 pounds or more in the last 3 months?: No Has the patient had a decrease in food intake/or appetite?:  No Does the patient have dental problems?: No Does the patient have eating habits or behaviors that may be indicators of an eating disorder including binging or inducing vomiting?: No Has the patient recently lost weight without trying?: 0 Has the patient been eating poorly because of a decreased appetite?: 0 Malnutrition Screening Tool Score: 0    Physical Exam Constitutional:      General: He is not in acute distress.    Appearance: Normal appearance. He is not ill-appearing.  HENT:     Head: Normocephalic and atraumatic.     Nose: Nose normal.  Eyes:     Extraocular Movements: Extraocular movements intact.     Conjunctiva/sclera: Conjunctivae normal.     Pupils: Pupils are equal, round, and reactive to light.  Cardiovascular:     Rate and Rhythm: Normal rate and regular rhythm.  Pulmonary:     Effort: Pulmonary effort is normal.     Breath sounds: Normal breath sounds.  Musculoskeletal:     Cervical back: Normal range of motion and neck supple.  Skin:    General: Skin is warm and dry.  Neurological:     General: No focal deficit present.     Mental Status: He is alert and oriented to person, place, and time.     Review of Systems  Psychiatric/Behavioral:  Positive for substance abuse. Negative for depression, hallucinations and suicidal ideas. The patient is nervous/anxious. The patient does  not have insomnia.     Blood pressure 126/81, pulse 63, temperature 97.6 F (36.4 C), temperature source Oral, resp. rate 18, SpO2 97%. There is no height or weight on file to calculate BMI.  Past Psychiatric History: MDD, GAD, AUD  Is the patient at risk to self? No  Has the patient been a risk to self in the past 6 months? No .    Has the patient been a risk to self within the distant past? No   Is the patient a risk to others? No   Has the patient been a risk to others in the past 6 months? No   Has the patient been a risk to others within the distant past? No   Past  Medical History:  Past Medical History:  Diagnosis Date   Alcohol  dependence ([name])    Allergies    Arthritis    Chest pain    Chronic lower back pain    Chronic pain of right wrist    Cocaine abuse ([name])    per note on 01/18/23   Coronary artery disease    a. Multiple prior caths/PCI. Cath 2013 with possible spasm of RCA, 70% ISR of mid LCx with subsequent DES to mLCx and prox LCX. b. H/o microvascular angina. c. Recurrent angina 08/2014 - s/p PTCA/DES to prox Cx, PTCA/CBA to OM1.  c. LHC 06/10/15 with patent stents and some ISR in LCX and OM-1 that was not flow limiting --> Rx    Dyslipidemia    a. Intolerant to many statins except tolerating Livalo .   GERD (gastroesophageal reflux disease)    H/O cardiac catheterization 10/25/2018   Heart attack [facility])    Hypertension    Myocardial infarction [facility]) ~ 2010   Renal mass    S/P angioplasty with stent, DES, to proximal and mid LCX 12/15/11 12/15/2011   S/P foot surgery, right 04/2021   Shoulder pain    Stroke ([name])    pt. reports had a stroke around time of MI 2010   Type II diabetes mellitus ([name])    Unstable angina ([name])     Family History:  Family History  Problem Relation Age of Onset   Leukemia Mother    Prostate cancer Father    Cancer Brother    Coronary artery disease Paternal Grandmother    Cancer Paternal Grandfather    Migraines Neg Hx    Headache Neg Hx    Colon cancer Neg Hx    Esophageal cancer Neg Hx    Rectal cancer Neg Hx    Stomach cancer Neg Hx     Social History:  Social History   Socioeconomic History   Marital status: Widowed    Spouse name: Not on file   Number of children: 2   Years of education: GED   Highest education level: Some college, no degree  Occupational History   Not on file  Tobacco Use   Smoking status: Former    Current packs/day: 0.00    Average packs/day: 1 pack/day for 10.0 years (10.0 ttl pk-yrs)    Types: Cigarettes    Start date: 10/06/2008    Quit date: 10/07/2018     Years since quitting: 5.3   Smokeless tobacco: Never  Vaping Use   Vaping status: Never Used  Substance and Sexual Activity   Alcohol  use: Not Currently    Comment: 05/28/2023-stopped drinking 22 days ago.   Drug use: Not Currently    Types: Cocaine   Sexual activity:  Not Currently  Other Topics Concern   Not on file  Social History Narrative   ** Merged History Encounter **    Pt states he only smoke when he drinks   Social Drivers of Health   Financial Resource Strain: Low Risk  (12/20/2023)   Overall Financial Resource Strain (CARDIA)    Difficulty of Paying Living Expenses: Not hard at all  Food Insecurity: No Food Insecurity (01/25/2024)   Hunger Vital Sign    Worried About Running Out of Food in the Last Year: Never true    Ran Out of Food in the Last Year: Never true  Transportation Needs: No Transportation Needs (01/25/2024)   PRAPARE - Administrator, Civil Service (Medical): No    Lack of Transportation (Non-Medical): No  Physical Activity: Sufficiently Active (12/20/2023)   Exercise Vital Sign    Days of Exercise per Week: 4 days    Minutes of Exercise per Session: 40 min  Stress: No Stress Concern Present (12/20/2023)   [employer] of Occupational Health - Occupational Stress Questionnaire    Feeling of Stress: Only a little  Social Connections: Moderately Integrated (12/20/2023)   Social Connection and Isolation Panel    Frequency of Communication with Friends and Family: Three times a week    Frequency of Social Gatherings with Friends and Family: Once a week    Attends Religious Services: More than 4 times per year    Active Member of [employer] or Organizations: Yes    Attends Banker Meetings: More than 4 times per year    Marital Status: Widowed  Intimate Partner Violence: Not At Risk (01/25/2024)   Humiliation, Afraid, Rape, and Kick questionnaire    Fear of Current or Ex-Partner: No    Emotionally Abused: No    Physically Abused:  No    Sexually Abused: No     Last Labs:  Admission on 01/24/2024, Discharged on 01/25/2024  Component Date Value Ref Range Status   WBC 01/24/2024 6.1  4.0 - 10.5 K/uL Final   RBC 01/24/2024 4.92  4.22 - 5.81 MIL/uL Final   Hemoglobin 01/24/2024 13.9  13.0 - 17.0 g/dL Final   HCT 92/71/7974 41.2  39.0 - 52.0 % Final   MCV 01/24/2024 83.7  80.0 - 100.0 fL Final   MCH 01/24/2024 28.3  26.0 - 34.0 pg Final   MCHC 01/24/2024 33.7  30.0 - 36.0 g/dL Final   RDW 92/71/7974 13.2  11.5 - 15.5 % Final   Platelets 01/24/2024 261  150 - 400 K/uL Final   nRBC 01/24/2024 0.0  0.0 - 0.2 % Final   Neutrophils Relative % 01/24/2024 44  % Final   Neutro Abs 01/24/2024 2.7  1.7 - 7.7 K/uL Final   Lymphocytes Relative 01/24/2024 40  % Final   Lymphs Abs 01/24/2024 2.4  0.7 - 4.0 K/uL Final   Monocytes Relative 01/24/2024 7  % Final   Monocytes Absolute 01/24/2024 0.4  0.1 - 1.0 K/uL Final   Eosinophils Relative 01/24/2024 8  % Final   Eosinophils Absolute 01/24/2024 0.5  0.0 - 0.5 K/uL Final   Basophils Relative 01/24/2024 1  % Final   Basophils Absolute 01/24/2024 0.1  0.0 - 0.1 K/uL Final   Immature Granulocytes 01/24/2024 0  % Final   Abs Immature Granulocytes 01/24/2024 0.02  0.00 - 0.07 K/uL Final   Performed at [facility], [address] [place], [place] [address]   Sodium 01/24/2024 136  135 -  145 mmol/L Final   Potassium 01/24/2024 4.1  3.5 - 5.1 mmol/L Final   Chloride 01/24/2024 104  98 - 111 mmol/L Final   CO2 01/24/2024 21 (L)  22 - 32 mmol/L Final   Glucose, Bld 01/24/2024 213 (H)  70 - 99 mg/dL Final   Glucose reference range applies only to samples taken after fasting for at least 8 hours.   BUN 01/24/2024 9  8 - 23 mg/dL Final   Creatinine, Ser 01/24/2024 0.83  0.61 - 1.24 mg/dL Final   Calcium  01/24/2024 9.3  8.9 - 10.3 mg/dL Final   Total Protein 92/71/7974 6.5  6.5 - 8.1 g/dL Final   Albumin 92/71/7974 4.1  3.5 - 5.0 g/dL Final   AST 92/71/7974 23  15 - 41 U/L Final    ALT 01/24/2024 36  0 - 44 U/L Final   Alkaline Phosphatase 01/24/2024 61  38 - 126 U/L Final   Total Bilirubin 01/24/2024 0.7  0.0 - 1.2 mg/dL Final   GFR, Estimated 01/24/2024 >60  >60 mL/min Final   Comment: (NOTE) Calculated using the CKD-EPI Creatinine Equation (2021)    Anion gap 01/24/2024 11  5 - 15 Final   Performed at [facility], [address], [place], [place] [address]   Alcohol , Ethyl (B) 01/24/2024 <15  <15 mg/dL Final   Comment: (NOTE) For medical purposes only. Performed at [facility], [address], [place], [place] [address]    POC Amphetamine UR 01/24/2024 None Detected  NONE DETECTED (Cut Off Level 1000 ng/mL) Final   POC Secobarbital (BAR) 01/24/2024 None Detected  NONE DETECTED (Cut Off Level 300 ng/mL) Final   POC Buprenorphine (BUP) 01/24/2024 None Detected  NONE DETECTED (Cut Off Level 10 ng/mL) Final   POC Oxazepam (BZO) 01/24/2024 None Detected  NONE DETECTED (Cut Off Level 300 ng/mL) Final   POC Cocaine UR 01/24/2024 None Detected  NONE DETECTED (Cut Off Level 300 ng/mL) Final   POC Methamphetamine UR 01/24/2024 None Detected  NONE DETECTED (Cut Off Level 1000 ng/mL) Final   POC Morphine  01/24/2024 None Detected  NONE DETECTED (Cut Off Level 300 ng/mL) Final   POC Methadone UR 01/24/2024 None Detected  NONE DETECTED (Cut Off Level 300 ng/mL) Final   POC Oxycodone  UR 01/24/2024 None Detected  NONE DETECTED (Cut Off Level 100 ng/mL) Final   POC Marijuana UR 01/24/2024 None Detected  NONE DETECTED (Cut Off Level 50 ng/mL) Final   Glucose-Capillary 01/24/2024 217 (H)  70 - 99 mg/dL Final   Glucose reference range applies only to samples taken after fasting for at least 8 hours.   Glucose-Capillary 01/25/2024 150 (H)  70 - 99 mg/dL Final   Glucose reference range applies only to samples taken after fasting for at least 8 hours.  Office Visit on 12/23/2023  Component Date Value Ref Range Status   Creatinine, Urine 12/23/2023 225.4  Not Estab.  mg/dL Final   Microalbumin, Urine 12/23/2023 20.3  Not Estab. ug/mL Final   Microalb/Creat Ratio 12/23/2023 9  0 - 29 mg/g creat Final   Comment:                        Normal:                0 -  29                        Moderately increased: 30 -  300                        Severely increased:       >300    Hemoglobin A1C 12/23/2023 7.2 (A)  4.0 - 5.6 % Final   WBC 12/23/2023 5.4  3.4 - 10.8 x10E3/uL Final   RBC 12/23/2023 5.57  4.14 - 5.80 x10E6/uL Final   Hemoglobin 12/23/2023 15.4  13.0 - 17.7 g/dL Final   Hematocrit 93/73/7974 48.4  37.5 - 51.0 % Final   MCV 12/23/2023 87  79 - 97 fL Final   MCH 12/23/2023 27.6  26.6 - 33.0 pg Final   MCHC 12/23/2023 31.8  31.5 - 35.7 g/dL Final   RDW 93/73/7974 13.0  11.6 - 15.4 % Final   Platelets 12/23/2023 248  150 - 450 x10E3/uL Final   Glucose 12/23/2023 140 (H)  70 - 99 mg/dL Final   BUN 93/73/7974 20  8 - 27 mg/dL Final   Creatinine, Ser 12/23/2023 1.06  0.76 - 1.27 mg/dL Final   eGFR 93/73/7974 79  >59 mL/min/1.73 Final   BUN/Creatinine Ratio 12/23/2023 19  10 - 24 Final   Sodium 12/23/2023 137  134 - 144 mmol/L Final   Potassium 12/23/2023 4.7  3.5 - 5.2 mmol/L Final   Chloride 12/23/2023 101  96 - 106 mmol/L Final   CO2 12/23/2023 18 (L)  20 - 29 mmol/L Final   Calcium  12/23/2023 10.3 (H)  8.6 - 10.2 mg/dL Final   Total Protein 93/73/7974 7.2  6.0 - 8.5 g/dL Final   Albumin 93/73/7974 4.9  3.9 - 4.9 g/dL Final   Globulin, Total 12/23/2023 2.3  1.5 - 4.5 g/dL Final   Bilirubin Total 12/23/2023 0.7  0.0 - 1.2 mg/dL Final   Alkaline Phosphatase 12/23/2023 84  44 - 121 IU/L Final   AST 12/23/2023 50 (H)  0 - 40 IU/L Final   ALT 12/23/2023 61 (H)  0 - 44 IU/L Final   Cholesterol, Total 12/23/2023 156  100 - 199 mg/dL Final   Triglycerides 93/73/7974 109  0 - 149 mg/dL Final   HDL 93/73/7974 40  >39 mg/dL Final   VLDL Cholesterol Cal 12/23/2023 20  5 - 40 mg/dL Final   LDL Chol Calc (NIH) 12/23/2023 96  0 - 99 mg/dL Final   Chol/HDL  Ratio 12/23/2023 3.9  0.0 - 5.0 ratio Final   Comment:                                   T. Chol/HDL Ratio                                             Men  Women                               1/2 Avg.Risk  3.4    3.3                                   Avg.Risk  5.0    4.4  2X Avg.Risk  9.6    7.1                                3X Avg.Risk 23.4   11.0    Prostate Specific Ag, Serum 12/23/2023 1.0  0.0 - 4.0 ng/mL Final   Comment: Roche ECLIA methodology. According to the American Urological Association, Serum PSA should decrease and remain at undetectable levels after radical prostatectomy. The AUA defines biochemical recurrence as an initial PSA value 0.2 ng/mL or greater followed by a subsequent confirmatory PSA value 0.2 ng/mL or greater. Values obtained with different assay methods or kits cannot be used interchangeably. Results cannot be interpreted as absolute evidence of the presence or absence of malignant disease.   Office Visit on 09/23/2023  Component Date Value Ref Range Status   Specific Gravity, UA 09/23/2023 1.025  1.005 - 1.030 Final   pH, UA 09/23/2023 5.5  5.0 - 7.5 Final   Color, UA 09/23/2023 Yellow  Yellow Final   Appearance Ur 09/23/2023 Clear  Clear Final   Leukocytes,UA 09/23/2023 Negative  Negative Final   Protein,UA 09/23/2023 1+ (A)  Negative/Trace Final   Glucose, UA 09/23/2023 Negative  Negative Final   Ketones, UA 09/23/2023 Negative  Negative Final   RBC, UA 09/23/2023 Negative  Negative Final   Bilirubin, UA 09/23/2023 Negative  Negative Final   Urobilinogen, Ur 09/23/2023 0.2  0.2 - 1.0 mg/dL Final   Nitrite, UA 96/72/7974 Negative  Negative Final   Microscopic Examination 09/23/2023 See below:   Final   WBC, UA 09/23/2023 0-5  0 - 5 /hpf Final   RBC, Urine 09/23/2023 0-2  0 - 2 /hpf Final   Epithelial Cells (non renal) 09/23/2023 0-10  0 - 10 /hpf Final   Casts 09/23/2023 Present  None seen /lpf Final   Cast Type  09/23/2023 Hyaline casts  N/A Final   Bacteria, UA 09/23/2023 Few  None seen/Few Final  Office Visit on 08/27/2023  Component Date Value Ref Range Status   Hemoglobin A1C 08/27/2023 7.1 (A)  4.0 - 5.6 % Final  Office Visit on 08/26/2023  Component Date Value Ref Range Status   Specific Gravity, UA 08/26/2023 1.025  1.005 - 1.030 Final   pH, UA 08/26/2023 5.5  5.0 - 7.5 Final   Color, UA 08/26/2023 Orange  Yellow Final   Appearance Ur 08/26/2023 Clear  Clear Final   Leukocytes,UA 08/26/2023 Negative  Negative Final   Protein,UA 08/26/2023 1+ (A)  Negative/Trace Final   Glucose, UA 08/26/2023 Negative  Negative Final   Ketones, UA 08/26/2023 Trace (A)  Negative Final   RBC, UA 08/26/2023 Negative  Negative Final   Bilirubin, UA 08/26/2023 Negative  Negative Final   Urobilinogen, Ur 08/26/2023 1.0  0.2 - 1.0 mg/dL Final   Nitrite, UA 97/72/7974 Negative  Negative Final   Microscopic Examination 08/26/2023 See below:   Final   WBC, UA 08/26/2023 0-5  0 - 5 /hpf Final   RBC, Urine 08/26/2023 0-2  0 - 2 /hpf Final   Epithelial Cells (non renal) 08/26/2023 0-10  0 - 10 /hpf Final   Casts 08/26/2023 Present  None seen /lpf Final   Cast Type 08/26/2023 Hyaline casts  N/A Final   Mucus, UA 08/26/2023 Present (A)  Not Estab. Final   Bacteria, UA 08/26/2023 Few  None seen/Few Final  Admission on 08/13/2023, Discharged on 08/13/2023  Component Date Value Ref Range Status  Sodium 08/13/2023 137  135 - 145 mmol/L Final   Potassium 08/13/2023 4.5  3.5 - 5.1 mmol/L Final   Chloride 08/13/2023 107  98 - 111 mmol/L Final   CO2 08/13/2023 22  22 - 32 mmol/L Final   Glucose, Bld 08/13/2023 137 (H)  70 - 99 mg/dL Final   Glucose reference range applies only to samples taken after fasting for at least 8 hours.   BUN 08/13/2023 13  8 - 23 mg/dL Final   Creatinine, Ser 08/13/2023 1.02  0.61 - 1.24 mg/dL Final   Calcium  08/13/2023 9.3  8.9 - 10.3 mg/dL Final   Total Protein 97/85/7974 7.0  6.5 - 8.1  g/dL Final   Albumin 97/85/7974 4.3  3.5 - 5.0 g/dL Final   AST 97/85/7974 23  15 - 41 U/L Final   ALT 08/13/2023 32  0 - 44 U/L Final   Alkaline Phosphatase 08/13/2023 46  38 - 126 U/L Final   Total Bilirubin 08/13/2023 0.6  0.0 - 1.2 mg/dL Final   GFR, Estimated 08/13/2023 >60  >60 mL/min Final   Comment: (NOTE) Calculated using the CKD-EPI Creatinine Equation (2021)    Anion gap 08/13/2023 8  5 - 15 Final   Performed at [facility], [address], [place], [place] [address]   WBC 08/13/2023 5.8  4.0 - 10.5 K/uL Final   RBC 08/13/2023 5.35  4.22 - 5.81 MIL/uL Final   Hemoglobin 08/13/2023 14.8  13.0 - 17.0 g/dL Final   HCT 97/85/7974 45.0  39.0 - 52.0 % Final   MCV 08/13/2023 84.1  80.0 - 100.0 fL Final   MCH 08/13/2023 27.7  26.0 - 34.0 pg Final   MCHC 08/13/2023 32.9  30.0 - 36.0 g/dL Final   RDW 97/85/7974 12.8  11.5 - 15.5 % Final   Platelets 08/13/2023 219  150 - 400 K/uL Final   nRBC 08/13/2023 0.0  0.0 - 0.2 % Final   Performed at [facility], [address] [place], [place] [address]   Color, Urine 08/13/2023 AMBER (A)  YELLOW Final   BIOCHEMICALS MAY BE AFFECTED BY COLOR   APPearance 08/13/2023 HAZY (A)  CLEAR Final   Specific Gravity, Urine 08/13/2023 1.027  1.005 - 1.030 Final   pH 08/13/2023 5.0  5.0 - 8.0 Final   Glucose, UA 08/13/2023 NEGATIVE  NEGATIVE mg/dL Final   Hgb urine dipstick 08/13/2023 NEGATIVE  NEGATIVE Final   Bilirubin Urine 08/13/2023 NEGATIVE  NEGATIVE Final   Ketones, ur 08/13/2023 NEGATIVE  NEGATIVE mg/dL Final   Protein, ur 97/85/7974 30 (A)  NEGATIVE mg/dL Final   Nitrite 97/85/7974 NEGATIVE  NEGATIVE Final   Leukocytes,Ua 08/13/2023 NEGATIVE  NEGATIVE Final   RBC / HPF 08/13/2023 0-5  0 - 5 RBC/hpf Final   WBC, UA 08/13/2023 0-5  0 - 5 WBC/hpf Final   Bacteria, UA 08/13/2023 NONE SEEN  NONE SEEN Final   Squamous Epithelial / HPF 08/13/2023 0-5  0 - 5 /HPF Final   Mucus 08/13/2023 PRESENT   Final   Hyaline Casts, UA  08/13/2023 PRESENT   Final   Ca Oxalate Crys, UA 08/13/2023 PRESENT   Final   Performed at [facility], [address] [place], [place] [address]   Neisseria Gonorrhea 08/13/2023 Negative   Final   Chlamydia 08/13/2023 Negative   Final   Comment 08/13/2023 Normal Reference Ranger Chlamydia - Negative   Final   Comment 08/13/2023 Normal Reference Range Neisseria Gonorrhea - Negative  Final    Allergies: Bee venom, Gadolinium, Shellfish allergy, Statins, and Testosterone  cypionate  Medications:  PTA Medications  Medication Sig   EPINEPHrine  0.3 mg/0.3 mL IJ SOAJ injection Inject 0.3 mg into the muscle as needed for anaphylaxis.   nitroGLYCERIN  (NITROSTAT ) 0.4 MG SL tablet Place 1 tablet (0.4 mg total) under the tongue every 5 (five) minutes x 3 doses as needed for chest pain. (Patient not taking: Reported on 01/25/2024)   Polyethyl Glycol-Propyl Glycol (SYSTANE) 0.4-0.3 % GEL ophthalmic gel Place 1 Application into both eyes daily as needed (For dry eyes).   Cholecalciferol  25 MCG (1000 UT) tablet Take 1,000 Units by mouth daily.   Multiple Vitamin (MULTIVITAMIN WITH MINERALS) TABS tablet Take 1 tablet by mouth daily.   LIVALO  4 MG TABS Take 1 tablet by mouth once daily (Patient taking differently: Take 4 mg by mouth daily.)   amLODipine  (NORVASC ) 10 MG tablet Take 1 tablet (10 mg total) by mouth daily.   ezetimibe  (ZETIA ) 10 MG tablet Take 1 tablet (10 mg total) by mouth daily.   aspirin  EC 81 MG tablet Take 81 mg by mouth daily.   clopidogrel  (PLAVIX ) 75 MG tablet Take 1 tablet by mouth once daily   fluticasone  (FLONASE ) 50 MCG/ACT nasal spray Place 2 sprays into both nostrils daily. (Patient not taking: Reported on 01/25/2024)   metoprolol  tartrate (LOPRESSOR ) 50 MG tablet Take 1 tablet (50 mg total) by mouth 2 (two) times daily.   cyclobenzaprine  (FLEXERIL ) 10 MG tablet Take 1 tablet (10 mg total) by mouth 3 (three) times daily as needed for muscle spasms. (Patient not taking:  Reported on 01/25/2024)   busPIRone  (BUSPAR ) 10 MG tablet Take 1 tablet (10 mg total) by mouth 3 (three) times daily. (Patient not taking: Reported on 01/25/2024)   ACCU-CHEK GUIDE TEST test strip USE 1 STRIP TO CHECK GLUCOSE THREE TIMES DAILY AS DIRECTED   gabapentin  (NEURONTIN ) 300 MG capsule Take 300 mg by mouth 2 (two) times daily. (Patient not taking: Reported on 01/25/2024)   metFORMIN  (GLUCOPHAGE ) 500 MG tablet Take 500 mg by mouth 2 (two) times daily with a meal.    Treatment Plan Summary: Daily contact with patient to assess and evaluate symptoms and progress in treatment and Medication management   Safety and Monitoring: Voluntary admission to inpatient psychiatric unit for safety, stabilization and treatment CIWA Protocol Librium  detox protol order  Daily contact with patient to assess and evaluate symptoms and progress in treatment Patient's case to be discussed in multi-disciplinary team meeting Observation Level : q15 minute checks Vital signs: q12 hours Precautions: Safety   Long Term Goal(s): Improvement in symptoms so as ready for discharge   Short Term Goals: Ability to identify changes in lifestyle to reduce recurrence of condition will improve, Ability to verbalize feelings will improve. Ability to demonstrate self-control will improve sobriety.  Ability to identify and develop effective coping behaviors will improve, Ability to maintain clinical measurements within normal limits. to identify triggers associated with alcohol  use/GAD/MDD.   Diagnoses Principal Problem:  Alcohol  Use Disorder, Continue CIWA and Librium  Taper, currently stable Active Problems: Type 2 Diabetes, controlled, AC/HS, SS glucose >250, continue metformin   Hypertension, stable, continue Amlodipine  and Metoprolol  , goal <140/90 Hyperlipidemia, continue Zetia   GAD, mild, continue stable with Librium  and Hydroxyzine        Medical Decision Making  Admission to Facility Based Crisis for  stabilization of alcohol  dependence detox. Average expected LOS 3-5 days Continue AAA Consider CDIOP vs outpatient with [facility] provider  at discharge Continue AA and recommend sponsor if patient currently doesn't have a sponsor. Patient stable with a goal of remaining free from complicated withdrawal symptoms.    Recommendations  Based on my evaluation the patient does not appear to have an emergency medical condition.  Suzen Lesches, NP 01/25/24  2:45 PM

## 2024-01-26 DIAGNOSIS — F411 Generalized anxiety disorder: Secondary | ICD-10-CM | POA: Diagnosis not present

## 2024-01-26 DIAGNOSIS — E1159 Type 2 diabetes mellitus with other circulatory complications: Secondary | ICD-10-CM | POA: Diagnosis not present

## 2024-01-26 DIAGNOSIS — F1020 Alcohol dependence, uncomplicated: Secondary | ICD-10-CM | POA: Diagnosis not present

## 2024-01-26 DIAGNOSIS — Z7984 Long term (current) use of oral hypoglycemic drugs: Secondary | ICD-10-CM

## 2024-01-26 DIAGNOSIS — I10 Essential (primary) hypertension: Secondary | ICD-10-CM | POA: Diagnosis not present

## 2024-01-26 DIAGNOSIS — I252 Old myocardial infarction: Secondary | ICD-10-CM | POA: Diagnosis not present

## 2024-01-26 LAB — GLUCOSE, CAPILLARY
Glucose-Capillary: 177 mg/dL — ABNORMAL HIGH (ref 70–99)
Glucose-Capillary: 151 mg/dL — ABNORMAL HIGH (ref 70–99)
Glucose-Capillary: 146 mg/dL — ABNORMAL HIGH (ref 70–99)

## 2024-01-26 MED ORDER — POLYVINYL ALCOHOL 1.4 % OP SOLN: 2.0000 [drp] | Freq: Every day | OPHTHALMIC | Status: DC | PRN

## 2024-01-26 MED ORDER — EPINEPHRINE 0.3 MG/0.3ML IJ SOAJ: 0.3000 mg | INTRAMUSCULAR | Status: DC | PRN

## 2024-01-26 MED ORDER — GABAPENTIN 100 MG PO CAPS
100.0000 mg | ORAL_CAPSULE | Freq: Three times a day (TID) | ORAL | Status: DC
  Administered 2024-01-26 – 2024-01-27 (×4): 100 mg via ORAL
  Filled 2024-01-26 (×4): qty 1

## 2024-01-26 NOTE — ED Notes (Addendum)
 Pt provided with prn glucerna as requested. Pt denies pain and physical discomfort. Pt c/o constipation, pt administered prn mom. Pt denies si hi and avh- verbal contract for safety provided. Pt generally calm and cooperative, however, thoughts seems to be somewhat disorganized.

## 2024-01-26 NOTE — ED Notes (Signed)
 Pt provided with snack as requested at scheduled tome. Pt compliant with scheduled medications. No concerns or complaints reported to RN thus far.

## 2024-01-26 NOTE — ED Notes (Signed)
 Patient admitted for alcohol  abuse but now denies withdrawal symptoms associated with alcohol . He denies SI/HI. Denies audiovisual hallucination . Denied sleep disturbances. Denied N/V. Or sweating. Hygiene maintained. Speech clear. Med compliant. He is quiet and calm, no issue noted.

## 2024-01-26 NOTE — ED Provider Notes (Addendum)
 Behavioral Health Progress Note  Date and Time: 01/26/2024 11:27 AM Name: [name] MRN:  [id number]  Subjective:  Patinet tolerating Librium  taper well and CIWA <5. Patient denies SI, HI or AVH. Reports chronic back pain and anxiety and states this is why he relapsed on alcohol . Denies depression. Patient requesting trial of gabapentin . States he is supposed to get a laminectomy on 7/6. Reports fair sleep and appetite and states he is motivated to maintain sobriety.  Diagnosis:  Final diagnoses:  Alcohol  use disorder, severe, dependence ([name])  GAD (generalized anxiety disorder)    Total Time spent with patient: 20 minutes  Past Psychiatric History: MDD, GAD, AUD  Past Medical History:  Past Medical History:  Diagnosis Date   Alcohol  dependence ([name])    Allergies    Arthritis    Chest pain    Chronic lower back pain    Chronic pain of right wrist    Cocaine abuse ([name])    per note on 01/18/23   Coronary artery disease    a. Multiple prior caths/PCI. Cath 2013 with possible spasm of RCA, 70% ISR of mid LCx with subsequent DES to mLCx and prox LCX. b. H/o microvascular angina. c. Recurrent angina 08/2014 - s/p PTCA/DES to prox Cx, PTCA/CBA to OM1.  c. LHC 06/10/15 with patent stents and some ISR in LCX and OM-1 that was not flow limiting --> Rx    Dyslipidemia    a. Intolerant to many statins except tolerating Livalo .   GERD (gastroesophageal reflux disease)    H/O cardiac catheterization 10/25/2018   Heart attack [facility])    Hypertension    Myocardial infarction [facility]) ~ 2010   Renal mass    S/P angioplasty with stent, DES, to proximal and mid LCX 12/15/11 12/15/2011   S/P foot surgery, right 04/2021   Shoulder pain    Stroke ([name])    pt. reports had a stroke around time of MI 2010   Type II diabetes mellitus ([name])    Unstable angina ([name])     Family History:  Family History  Problem Relation Age of Onset   Leukemia Mother    Prostate cancer Father    Cancer Brother     Coronary artery disease Paternal Grandmother    Cancer Paternal Grandfather    Migraines Neg Hx    Headache Neg Hx    Colon cancer Neg Hx    Esophageal cancer Neg Hx    Rectal cancer Neg Hx    Stomach cancer Neg Hx    Denies history of suicide attempts  Social History:  Socioeconomic History   Marital status: Widowed      Spouse name: Not on file   Number of children: 2   Years of education: GED   Highest education level: Some college, no degree  Occupational History   Not on file  Tobacco Use   Smoking status: Former      Current packs/day: 0.00      Average packs/day: 1 pack/day for 10.0 years (10.0 ttl pk-yrs)      Types: Cigarettes      Start date: 10/06/2008      Quit date: 10/07/2018      Years since quitting: 5.3   Smokeless tobacco: Never  Vaping Use   Vaping status: Never Used  Substance and Sexual Activity   Alcohol  use: Not Currently      Comment: 05/28/2023-stopped drinking 22 days ago.   Drug use: Not Currently  Types: Cocaine   Sexual activity: Not Currently    Additional Social History:                         Sleep: Fair  Appetite:  Fair  Current Medications:  Current Facility-Administered Medications  Medication Dose Route Frequency Provider Last Rate Last Admin   acetaminophen  (TYLENOL ) tablet 650 mg  650 mg Oral Q6H PRN Arloa Suzen RAMAN, NP       alum & mag hydroxide-simeth (MAALOX/MYLANTA) 200-200-20 MG/5ML suspension 30 mL  30 mL Oral Q4H PRN Arloa Suzen RAMAN, NP       amLODipine  (NORVASC ) tablet 10 mg  10 mg Oral Daily Arloa Suzen RAMAN, NP   10 mg at 01/26/24 0935   artificial tears ophthalmic solution 2 drop  2 drop Both Eyes Daily PRN Jarrick Fjeld, MD       aspirin  EC tablet 81 mg  81 mg Oral Daily Arloa Suzen RAMAN, NP   81 mg at 01/26/24 9064   chlordiazePOXIDE  (LIBRIUM ) capsule 25 mg  25 mg Oral Q6H PRN Arloa Suzen RAMAN, NP       chlordiazePOXIDE  (LIBRIUM ) capsule 25 mg  25 mg Oral TID Arloa Suzen RAMAN, NP        Followed by   NOREEN ON 01/27/2024] chlordiazePOXIDE  (LIBRIUM ) capsule 25 mg  25 mg Oral Letha Arloa Suzen RAMAN, NP       Followed by   NOREEN ON 01/28/2024] chlordiazePOXIDE  (LIBRIUM ) capsule 25 mg  25 mg Oral Daily Arloa Suzen RAMAN, NP       clopidogrel  (PLAVIX ) tablet 75 mg  75 mg Oral Daily Arloa Suzen RAMAN, NP   75 mg at 01/26/24 9063   EPINEPHrine  (EPI-PEN) injection 0.3 mg  0.3 mg Intramuscular PRN Cleburne Savini, MD       ezetimibe  (ZETIA ) tablet 10 mg  10 mg Oral Daily Arloa Suzen RAMAN, NP   10 mg at 01/26/24 0936   feeding supplement (GLUCERNA SHAKE) (GLUCERNA SHAKE) liquid 237 mL  237 mL Oral BID PRN Arloa Suzen RAMAN, NP   237 mL at 01/26/24 1002   gabapentin  (NEURONTIN ) capsule 100 mg  100 mg Oral TID Hedwig Mcfall, MD       hydrOXYzine  (ATARAX ) tablet 25 mg  25 mg Oral TID PRN Arloa Suzen RAMAN, NP   25 mg at 01/25/24 1825   insulin  aspart (novoLOG ) injection 0-4 Units  0-4 Units Subcutaneous TID WC Arloa Suzen RAMAN, NP       loperamide  (IMODIUM ) capsule 2-4 mg  2-4 mg Oral PRN Arloa Suzen RAMAN, NP       magnesium  hydroxide (MILK OF MAGNESIA) suspension 30 mL  30 mL Oral Daily PRN Arloa Suzen RAMAN, NP   30 mL at 01/26/24 9061   metFORMIN  (GLUCOPHAGE ) tablet 500 mg  500 mg Oral BID WC Arloa Suzen RAMAN, NP   500 mg at 01/26/24 9061   metoprolol  tartrate (LOPRESSOR ) tablet 50 mg  50 mg Oral BID Arloa Suzen RAMAN, NP   50 mg at 01/26/24 0937   multivitamin with minerals tablet 1 tablet  1 tablet Oral Daily Arloa Suzen RAMAN, NP   1 tablet at 01/26/24 9062   nitroGLYCERIN  (NITROSTAT ) SL tablet 0.4 mg  0.4 mg Sublingual Q5 Min x 3 PRN Arloa Suzen RAMAN, NP       OLANZapine  (ZYPREXA ) injection 5 mg  5 mg Intramuscular TID PRN Arloa Suzen RAMAN, NP       OLANZapine  zydis (ZYPREXA )  disintegrating tablet 5 mg  5 mg Oral TID PRN Arloa Suzen RAMAN, NP       ondansetron  (ZOFRAN -ODT) disintegrating tablet 4 mg  4 mg Oral Q6H PRN Harris, Kimberly S, NP       ondansetron   (ZOFRAN -ODT) disintegrating tablet 4 mg  4 mg Oral Q6H PRN Arloa Suzen RAMAN, NP       traZODone  (DESYREL ) tablet 50 mg  50 mg Oral QHS PRN Arloa Suzen RAMAN, NP       Current Outpatient Medications  Medication Sig Dispense Refill   ACCU-CHEK GUIDE TEST test strip USE 1 STRIP TO CHECK GLUCOSE THREE TIMES DAILY AS DIRECTED 300 each 0   amLODipine  (NORVASC ) 10 MG tablet Take 1 tablet (10 mg total) by mouth daily. 90 tablet 0   aspirin  EC 81 MG tablet Take 81 mg by mouth daily.     busPIRone  (BUSPAR ) 10 MG tablet Take 1 tablet (10 mg total) by mouth 3 (three) times daily. (Patient not taking: Reported on 01/25/2024) 30 tablet 2   Cholecalciferol  25 MCG (1000 UT) tablet Take 1,000 Units by mouth daily.     clopidogrel  (PLAVIX ) 75 MG tablet Take 1 tablet by mouth once daily 90 tablet 0   cyclobenzaprine  (FLEXERIL ) 10 MG tablet Take 1 tablet (10 mg total) by mouth 3 (three) times daily as needed for muscle spasms. (Patient not taking: Reported on 01/25/2024) 30 tablet 0   EPINEPHrine  0.3 mg/0.3 mL IJ SOAJ injection Inject 0.3 mg into the muscle as needed for anaphylaxis. 1 each 2   ezetimibe  (ZETIA ) 10 MG tablet Take 1 tablet (10 mg total) by mouth daily. 90 tablet 0   fluticasone  (FLONASE ) 50 MCG/ACT nasal spray Place 2 sprays into both nostrils daily. (Patient not taking: Reported on 01/25/2024) 9.9 mL 2   gabapentin  (NEURONTIN ) 300 MG capsule Take 300 mg by mouth 2 (two) times daily. (Patient not taking: Reported on 01/25/2024)     LIVALO  4 MG TABS Take 1 tablet by mouth once daily (Patient taking differently: Take 4 mg by mouth daily.) 90 tablet 3   metFORMIN  (GLUCOPHAGE ) 500 MG tablet Take 500 mg by mouth 2 (two) times daily with a meal.     metoprolol  tartrate (LOPRESSOR ) 50 MG tablet Take 1 tablet (50 mg total) by mouth 2 (two) times daily. 180 tablet 3   Multiple Vitamin (MULTIVITAMIN WITH MINERALS) TABS tablet Take 1 tablet by mouth daily.     nitroGLYCERIN  (NITROSTAT ) 0.4 MG SL tablet Place 1  tablet (0.4 mg total) under the tongue every 5 (five) minutes x 3 doses as needed for chest pain. (Patient not taking: Reported on 01/25/2024) 25 tablet 3   Polyethyl Glycol-Propyl Glycol (SYSTANE) 0.4-0.3 % GEL ophthalmic gel Place 1 Application into both eyes daily as needed (For dry eyes).      Labs  Lab Results:  Admission on 01/25/2024  Component Date Value Ref Range Status   Glucose-Capillary 01/26/2024 177 (H)  70 - 99 mg/dL Final   Glucose reference range applies only to samples taken after fasting for at least 8 hours.  Admission on 01/24/2024, Discharged on 01/25/2024  Component Date Value Ref Range Status   WBC 01/24/2024 6.1  4.0 - 10.5 K/uL Final   RBC 01/24/2024 4.92  4.22 - 5.81 MIL/uL Final   Hemoglobin 01/24/2024 13.9  13.0 - 17.0 g/dL Final   HCT 92/71/7974 41.2  39.0 - 52.0 % Final   MCV 01/24/2024 83.7  80.0 - 100.0 fL Final  MCH 01/24/2024 28.3  26.0 - 34.0 pg Final   MCHC 01/24/2024 33.7  30.0 - 36.0 g/dL Final   RDW 92/71/7974 13.2  11.5 - 15.5 % Final   Platelets 01/24/2024 261  150 - 400 K/uL Final   nRBC 01/24/2024 0.0  0.0 - 0.2 % Final   Neutrophils Relative % 01/24/2024 44  % Final   Neutro Abs 01/24/2024 2.7  1.7 - 7.7 K/uL Final   Lymphocytes Relative 01/24/2024 40  % Final   Lymphs Abs 01/24/2024 2.4  0.7 - 4.0 K/uL Final   Monocytes Relative 01/24/2024 7  % Final   Monocytes Absolute 01/24/2024 0.4  0.1 - 1.0 K/uL Final   Eosinophils Relative 01/24/2024 8  % Final   Eosinophils Absolute 01/24/2024 0.5  0.0 - 0.5 K/uL Final   Basophils Relative 01/24/2024 1  % Final   Basophils Absolute 01/24/2024 0.1  0.0 - 0.1 K/uL Final   Immature Granulocytes 01/24/2024 0  % Final   Abs Immature Granulocytes 01/24/2024 0.02  0.00 - 0.07 K/uL Final   Performed at [facility], [address], [place], [place] [address]   Sodium 01/24/2024 136  135 - 145 mmol/L Final   Potassium 01/24/2024 4.1  3.5 - 5.1 mmol/L Final   Chloride 01/24/2024 104  98 - 111  mmol/L Final   CO2 01/24/2024 21 (L)  22 - 32 mmol/L Final   Glucose, Bld 01/24/2024 213 (H)  70 - 99 mg/dL Final   Glucose reference range applies only to samples taken after fasting for at least 8 hours.   BUN 01/24/2024 9  8 - 23 mg/dL Final   Creatinine, Ser 01/24/2024 0.83  0.61 - 1.24 mg/dL Final   Calcium  01/24/2024 9.3  8.9 - 10.3 mg/dL Final   Total Protein 92/71/7974 6.5  6.5 - 8.1 g/dL Final   Albumin 92/71/7974 4.1  3.5 - 5.0 g/dL Final   AST 92/71/7974 23  15 - 41 U/L Final   ALT 01/24/2024 36  0 - 44 U/L Final   Alkaline Phosphatase 01/24/2024 61  38 - 126 U/L Final   Total Bilirubin 01/24/2024 0.7  0.0 - 1.2 mg/dL Final   GFR, Estimated 01/24/2024 >60  >60 mL/min Final   Comment: (NOTE) Calculated using the CKD-EPI Creatinine Equation (2021)    Anion gap 01/24/2024 11  5 - 15 Final   Performed at [facility], [address], [place], [place] [address]   Alcohol , Ethyl (B) 01/24/2024 <15  <15 mg/dL Final   Comment: (NOTE) For medical purposes only. Performed at [facility], [address] [place], [place] [address]    POC Amphetamine UR 01/24/2024 None Detected  NONE DETECTED (Cut Off Level 1000 ng/mL) Final   POC Secobarbital (BAR) 01/24/2024 None Detected  NONE DETECTED (Cut Off Level 300 ng/mL) Final   POC Buprenorphine (BUP) 01/24/2024 None Detected  NONE DETECTED (Cut Off Level 10 ng/mL) Final   POC Oxazepam (BZO) 01/24/2024 None Detected  NONE DETECTED (Cut Off Level 300 ng/mL) Final   POC Cocaine UR 01/24/2024 None Detected  NONE DETECTED (Cut Off Level 300 ng/mL) Final   POC Methamphetamine UR 01/24/2024 None Detected  NONE DETECTED (Cut Off Level 1000 ng/mL) Final   POC Morphine  01/24/2024 None Detected  NONE DETECTED (Cut Off Level 300 ng/mL) Final   POC Methadone UR 01/24/2024 None Detected  NONE DETECTED (Cut Off Level 300 ng/mL) Final   POC Oxycodone  UR 01/24/2024 None Detected  NONE DETECTED (Cut Off Level 100 ng/mL) Final   POC Marijuana  UR 01/24/2024 None Detected  NONE DETECTED (Cut Off Level 50 ng/mL) Final   Glucose-Capillary 01/24/2024 217 (H)  70 - 99 mg/dL Final   Glucose reference range applies only to samples taken after fasting for at least 8 hours.   Glucose-Capillary 01/25/2024 150 (H)  70 - 99 mg/dL Final   Glucose reference range applies only to samples taken after fasting for at least 8 hours.  Office Visit on 12/23/2023  Component Date Value Ref Range Status   Creatinine, Urine 12/23/2023 225.4  Not Estab. mg/dL Final   Microalbumin, Urine 12/23/2023 20.3  Not Estab. ug/mL Final   Microalb/Creat Ratio 12/23/2023 9  0 - 29 mg/g creat Final   Comment:                        Normal:                0 -  29                        Moderately increased: 30 - 300                        Severely increased:       >300    Hemoglobin A1C 12/23/2023 7.2 (A)  4.0 - 5.6 % Final   WBC 12/23/2023 5.4  3.4 - 10.8 x10E3/uL Final   RBC 12/23/2023 5.57  4.14 - 5.80 x10E6/uL Final   Hemoglobin 12/23/2023 15.4  13.0 - 17.7 g/dL Final   Hematocrit 93/73/7974 48.4  37.5 - 51.0 % Final   MCV 12/23/2023 87  79 - 97 fL Final   MCH 12/23/2023 27.6  26.6 - 33.0 pg Final   MCHC 12/23/2023 31.8  31.5 - 35.7 g/dL Final   RDW 93/73/7974 13.0  11.6 - 15.4 % Final   Platelets 12/23/2023 248  150 - 450 x10E3/uL Final   Glucose 12/23/2023 140 (H)  70 - 99 mg/dL Final   BUN 93/73/7974 20  8 - 27 mg/dL Final   Creatinine, Ser 12/23/2023 1.06  0.76 - 1.27 mg/dL Final   eGFR 93/73/7974 79  >59 mL/min/1.73 Final   BUN/Creatinine Ratio 12/23/2023 19  10 - 24 Final   Sodium 12/23/2023 137  134 - 144 mmol/L Final   Potassium 12/23/2023 4.7  3.5 - 5.2 mmol/L Final   Chloride 12/23/2023 101  96 - 106 mmol/L Final   CO2 12/23/2023 18 (L)  20 - 29 mmol/L Final   Calcium  12/23/2023 10.3 (H)  8.6 - 10.2 mg/dL Final   Total Protein 93/73/7974 7.2  6.0 - 8.5 g/dL Final   Albumin 93/73/7974 4.9  3.9 - 4.9 g/dL Final   Globulin, Total 12/23/2023 2.3   1.5 - 4.5 g/dL Final   Bilirubin Total 12/23/2023 0.7  0.0 - 1.2 mg/dL Final   Alkaline Phosphatase 12/23/2023 84  44 - 121 IU/L Final   AST 12/23/2023 50 (H)  0 - 40 IU/L Final   ALT 12/23/2023 61 (H)  0 - 44 IU/L Final   Cholesterol, Total 12/23/2023 156  100 - 199 mg/dL Final   Triglycerides 93/73/7974 109  0 - 149 mg/dL Final   HDL 93/73/7974 40  >39 mg/dL Final   VLDL Cholesterol Cal 12/23/2023 20  5 - 40 mg/dL Final   LDL Chol Calc (NIH) 12/23/2023 96  0 - 99 mg/dL Final   Chol/HDL Ratio 12/23/2023 3.9  0.0 - 5.0 ratio Final   Comment:                                   T. Chol/HDL Ratio                                             Men  Women                               1/2 Avg.Risk  3.4    3.3                                   Avg.Risk  5.0    4.4                                2X Avg.Risk  9.6    7.1                                3X Avg.Risk 23.4   11.0    Prostate Specific Ag, Serum 12/23/2023 1.0  0.0 - 4.0 ng/mL Final   Comment: Roche ECLIA methodology. According to the American Urological Association, Serum PSA should decrease and remain at undetectable levels after radical prostatectomy. The AUA defines biochemical recurrence as an initial PSA value 0.2 ng/mL or greater followed by a subsequent confirmatory PSA value 0.2 ng/mL or greater. Values obtained with different assay methods or kits cannot be used interchangeably. Results cannot be interpreted as absolute evidence of the presence or absence of malignant disease.   Office Visit on 09/23/2023  Component Date Value Ref Range Status   Specific Gravity, UA 09/23/2023 1.025  1.005 - 1.030 Final   pH, UA 09/23/2023 5.5  5.0 - 7.5 Final   Color, UA 09/23/2023 Yellow  Yellow Final   Appearance Ur 09/23/2023 Clear  Clear Final   Leukocytes,UA 09/23/2023 Negative  Negative Final   Protein,UA 09/23/2023 1+ (A)  Negative/Trace Final   Glucose, UA 09/23/2023 Negative  Negative Final   Ketones, UA 09/23/2023 Negative   Negative Final   RBC, UA 09/23/2023 Negative  Negative Final   Bilirubin, UA 09/23/2023 Negative  Negative Final   Urobilinogen, Ur 09/23/2023 0.2  0.2 - 1.0 mg/dL Final   Nitrite, UA 96/72/7974 Negative  Negative Final   Microscopic Examination 09/23/2023 See below:   Final   WBC, UA 09/23/2023 0-5  0 - 5 /hpf Final   RBC, Urine 09/23/2023 0-2  0 - 2 /hpf Final   Epithelial Cells (non renal) 09/23/2023 0-10  0 - 10 /hpf Final   Casts 09/23/2023 Present  None seen /lpf Final   Cast Type 09/23/2023 Hyaline casts  N/A Final   Bacteria, UA 09/23/2023 Few  None seen/Few Final  Office Visit on 08/27/2023  Component Date Value Ref Range Status   Hemoglobin A1C 08/27/2023 7.1 (A)  4.0 - 5.6 % Final  Office Visit on 08/26/2023  Component Date Value Ref Range Status   Specific Gravity, UA 08/26/2023  1.025  1.005 - 1.030 Final   pH, UA 08/26/2023 5.5  5.0 - 7.5 Final   Color, UA 08/26/2023 Orange  Yellow Final   Appearance Ur 08/26/2023 Clear  Clear Final   Leukocytes,UA 08/26/2023 Negative  Negative Final   Protein,UA 08/26/2023 1+ (A)  Negative/Trace Final   Glucose, UA 08/26/2023 Negative  Negative Final   Ketones, UA 08/26/2023 Trace (A)  Negative Final   RBC, UA 08/26/2023 Negative  Negative Final   Bilirubin, UA 08/26/2023 Negative  Negative Final   Urobilinogen, Ur 08/26/2023 1.0  0.2 - 1.0 mg/dL Final   Nitrite, UA 97/72/7974 Negative  Negative Final   Microscopic Examination 08/26/2023 See below:   Final   WBC, UA 08/26/2023 0-5  0 - 5 /hpf Final   RBC, Urine 08/26/2023 0-2  0 - 2 /hpf Final   Epithelial Cells (non renal) 08/26/2023 0-10  0 - 10 /hpf Final   Casts 08/26/2023 Present  None seen /lpf Final   Cast Type 08/26/2023 Hyaline casts  N/A Final   Mucus, UA 08/26/2023 Present (A)  Not Estab. Final   Bacteria, UA 08/26/2023 Few  None seen/Few Final  Admission on 08/13/2023, Discharged on 08/13/2023  Component Date Value Ref Range Status   Sodium 08/13/2023 137  135 - 145  mmol/L Final   Potassium 08/13/2023 4.5  3.5 - 5.1 mmol/L Final   Chloride 08/13/2023 107  98 - 111 mmol/L Final   CO2 08/13/2023 22  22 - 32 mmol/L Final   Glucose, Bld 08/13/2023 137 (H)  70 - 99 mg/dL Final   Glucose reference range applies only to samples taken after fasting for at least 8 hours.   BUN 08/13/2023 13  8 - 23 mg/dL Final   Creatinine, Ser 08/13/2023 1.02  0.61 - 1.24 mg/dL Final   Calcium  08/13/2023 9.3  8.9 - 10.3 mg/dL Final   Total Protein 97/85/7974 7.0  6.5 - 8.1 g/dL Final   Albumin 97/85/7974 4.3  3.5 - 5.0 g/dL Final   AST 97/85/7974 23  15 - 41 U/L Final   ALT 08/13/2023 32  0 - 44 U/L Final   Alkaline Phosphatase 08/13/2023 46  38 - 126 U/L Final   Total Bilirubin 08/13/2023 0.6  0.0 - 1.2 mg/dL Final   GFR, Estimated 08/13/2023 >60  >60 mL/min Final   Comment: (NOTE) Calculated using the CKD-EPI Creatinine Equation (2021)    Anion gap 08/13/2023 8  5 - 15 Final   Performed at [facility], [address] [place], [place] [address]   WBC 08/13/2023 5.8  4.0 - 10.5 K/uL Final   RBC 08/13/2023 5.35  4.22 - 5.81 MIL/uL Final   Hemoglobin 08/13/2023 14.8  13.0 - 17.0 g/dL Final   HCT 97/85/7974 45.0  39.0 - 52.0 % Final   MCV 08/13/2023 84.1  80.0 - 100.0 fL Final   MCH 08/13/2023 27.7  26.0 - 34.0 pg Final   MCHC 08/13/2023 32.9  30.0 - 36.0 g/dL Final   RDW 97/85/7974 12.8  11.5 - 15.5 % Final   Platelets 08/13/2023 219  150 - 400 K/uL Final   nRBC 08/13/2023 0.0  0.0 - 0.2 % Final   Performed at [facility], [address] [place], [place] [address]   Color, Urine 08/13/2023 AMBER (A)  YELLOW Final   BIOCHEMICALS MAY BE AFFECTED BY COLOR   APPearance 08/13/2023 HAZY (A)  CLEAR Final   Specific Gravity, Urine 08/13/2023 1.027  1.005 -  1.030 Final   pH 08/13/2023 5.0  5.0 - 8.0 Final   Glucose, UA 08/13/2023 NEGATIVE  NEGATIVE mg/dL Final   Hgb urine dipstick 08/13/2023 NEGATIVE  NEGATIVE Final   Bilirubin Urine 08/13/2023 NEGATIVE   NEGATIVE Final   Ketones, ur 08/13/2023 NEGATIVE  NEGATIVE mg/dL Final   Protein, ur 97/85/7974 30 (A)  NEGATIVE mg/dL Final   Nitrite 97/85/7974 NEGATIVE  NEGATIVE Final   Leukocytes,Ua 08/13/2023 NEGATIVE  NEGATIVE Final   RBC / HPF 08/13/2023 0-5  0 - 5 RBC/hpf Final   WBC, UA 08/13/2023 0-5  0 - 5 WBC/hpf Final   Bacteria, UA 08/13/2023 NONE SEEN  NONE SEEN Final   Squamous Epithelial / HPF 08/13/2023 0-5  0 - 5 /HPF Final   Mucus 08/13/2023 PRESENT   Final   Hyaline Casts, UA 08/13/2023 PRESENT   Final   Ca Oxalate Crys, UA 08/13/2023 PRESENT   Final   Performed at [facility], [address], [place], [place] [address]   Neisseria Gonorrhea 08/13/2023 Negative   Final   Chlamydia 08/13/2023 Negative   Final   Comment 08/13/2023 Normal Reference Ranger Chlamydia - Negative   Final   Comment 08/13/2023 Normal Reference Range Neisseria Gonorrhea - Negative   Final    Blood Alcohol  level:  Lab Results  Component Value Date   [facility] <15 01/24/2024   ETH <10 02/15/2023    Metabolic Disorder Labs: Lab Results  Component Value Date   HGBA1C 7.2 (A) 12/23/2023   MPG 159.94 02/15/2023   MPG 142.72 08/07/2022   Lab Results  Component Value Date   PROLACTIN 13.0 03/17/2020   Lab Results  Component Value Date   CHOL 156 12/23/2023   TRIG 109 12/23/2023   HDL 40 12/23/2023   CHOLHDL 3.9 12/23/2023   VLDL 44 (H) 02/15/2023   LDLCALC 96 12/23/2023   LDLCALC 67 02/15/2023    Therapeutic Lab Levels: No results found for: LITHIUM No results found for: VALPROATE No results found for: CBMZ  Physical Findings   AIMS    Flowsheet Row Ad[facility] (Discharged) [facility] (Discharged) from [facility] Admission (Discharged) from OP [facility] Admission (Discharged) from OP Visit  [facility] Ad[facility] (Discharged) [facility]  AIMS Total Score 0 0 0 0 0   AUDIT    Flowsheet Row Office Visit from 11/16/2022 in [facility] Of [facility] Office Visit from 08[facility]27/2020 in [facility] Admission (Discharged) from OP [facility] Ad[facility] (Discharged) [facility] Ad[facility] (Discharged) from [facility]  Alcohol  Use Disorder Identification Test Final Score (AUDIT) 24 27 28 13 25    GAD-7    Flowsheet Row Office Visit from 08/27/2023 in [facility] Dept Of [facility] Counselor from 05/17/2023 in [facility] Office Visit from 02/15/2023 in [facility] - [facility] Dept Of [facility] Clinical Support from 02/24/2022 in [facility] Office Visit from 01/08/2022 in [facility] -  A Dept Of [facility]  Total GAD-7 Score 0 12 10 2 12    PHQ2-9    Flowsheet Row ED from 01/24/2024 in [facility] Office Visit from 12/23/2023 in [facility] - A Dept Of [facility] Office Visit from 08/27/2023 in [facility] - A Dept Of [facility] Counselor from 05/17/2023 in [facility] ED from 02/15/2023 in [facility]  PHQ-2 Total Score 0 0 0 3 0  PHQ-9 Total Score -- 3 0 9 --   Flowsheet Row ED from 01/25/2024 in [facility] ED from 01/24/2024 in [facility] UC from 12/10/2023 in [facility]  [facility] RISK CATEGORY No Risk No Risk No Risk     Musculoskeletal  Strength & Muscle Tone: within normal limits Gait & Station: normal Patient leans: N/A  Psychiatric Specialty Exam  Presentation  General Appearance:  Appropriate for Environment  Eye Contact: Good  Speech: Clear and Coherent  Speech Volume: Normal  Handedness: Right   Mood and Affect  Mood: Anxious  Affect: Congruent   Thought Process  Thought Processes: Coherent; Goal Directed  Descriptions of Associations:Intact  Orientation:Full (Time, Place and Person)  Thought Content:WDL  Diagnosis of Schizophrenia or Schizoaffective disorder in past: No    Hallucinations:denies Ideas of Reference: denies  Suicidal Thoughts:denies Homicidal Thoughts:denies  Sensorium  Memory: Immediate Good  Judgment: Intact  Insight: Present   Executive Functions  Concentration: Good  Attention Span: Good  Recall: Fair  Fund of Knowledge: Good  Language: Good   Psychomotor Activity  Psychomotor Activity:No data recorded  Assets  Assets: Communication Skills; Desire for Improvement   Sleep  Sleep:No data recorded Estimated Sleeping Duration (Last 24 Hours): 7.00-9.50 hours  No data recorded  Physical Exam  Physical Exam ROS Blood pressure (!) 132/97, pulse 63, temperature 98.5 F (36.9 C), temperature source Oral, resp. rate 17, SpO2 100%. There is no height or weight on file to calculate BMI.  Treatment Plan Summary: Daily contact with patient to assess and evaluate symptoms and progress in treatment, Medication management, and Plan    Assessment: 7/30: reports only anxiety and chronic pain. Denies Si, hi or avh. Patient withdrawing form alcohol  on a librium  taper CIWA <5. Requesting trial of gabapentin  after discussing r/b/a.  Principal Problem:  Alcohol  Use Disorder, Continue CIWA and Librium  Taper, currently stable - librium  taper -patient declines  starting naltrexone  stating he was able to stay sober on his own  Active Problems: Type 2 Diabetes, controlled, AC/HS, SS glucose >250, continue metformin   Hypertension, stable, continue Amlodipine  and Metoprolol  , goal <140/90 Hyperlipidemia, continue Zetia   GAD, mild, continue stable with Librium  and Hydroxyzine    - continue Librium  taper - start gabapentin  100 mg TID for anxiety and for alcohol  withdrawal    Cher Egnor, MD 01/26/2024 11:27 AM

## 2024-01-26 NOTE — ED Notes (Signed)
 Patient is sleeping. Respirations equal and unlabored. No change in assessment or acuity. Routine safety checks conducted according to facility protocol.

## 2024-01-26 NOTE — Discharge Planning (Signed)
 LCSW met with patient to assess current mood, affect, physical state, and inquire about needs/goals while here in [facility] and after discharge. Patient reports he presented to [facility] for alcohol  detox. Patient reports he has been living alone and is very active with his AA meetings, working out, eating healthy and doing music classes. Patient reported taking up many activities following death of his spouse and his retirement. Patient stated he was sober for many years with a long history of sobriety until past couple of years.   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 Patient has a scheduled laminectomy on August 6. He stated his doctor had him going to a pain clinic the last year to take tramadol . He stated the wait was too long and eventually stopped going and pain has gotten worse lately.  Patient reported completing CDIOP here at [facility] upstairs about 9/10 months ago and was referred for aftercare at [facility] family support group twice a week and attends these currently.   Patient does not take any psychotropics and is followed by his PCP Bascom Borer at [facility] patient care center. Patient's current goal is seeking detox and plans to return home with ongoing supports of his AA meetings for continued sobriety. Patient currently denies any SI/HI/AVH. No other needs were reported at this time by patient.

## 2024-01-26 NOTE — Group Note (Signed)
 Group Topic: Identity and Relationships  Group Date: 01/26/2024 Start Time: 1730 End Time: 1830 Facilitators: [name], RN  Department: [facility]  Number of Participants: 4  Group Focus: nursing group Treatment Modality:  Psychoeducation Interventions utilized were exploration, group exercise, and patient education Purpose: improve communication skills and increase insight  Name: [name] Date of Birth: [birth year]  MR: [id number]    Level of Participation: active Quality of Participation: attentive and cooperative Interactions with others: gave feedback Mood/Affect: appropriate Triggers (if applicable): n/a Cognition: coherent/clear Progress: Gaining insight Response: pt says he will look and listen and find a suitable sponsor  Plan: patient will be encouraged to attend future RN education groups  Patients Problems:  Patient Active Problem List   Diagnosis Date Noted   Alcohol  use disorder, severe, dependence ([name]) 01/25/2024   Scrotal pain, right 08/26/2023   Epididymitis, right 08/26/2023   Complex renal cyst, right, 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/26/2023   Impingement syndrome of right shoulder 07/01/2023   Pain in right shoulder 06/16/2023   Anxiety disorder, unspecified 02/16/2023   Alcohol  use disorder 02/16/2023   Sleep disorder, unspecified 02/16/2023   Alcohol  abuse 02/15/2023   Alcohol  dependence ([name]) 05/16/2022   Low energy 03/06/2022   Moderate alcohol  dependence in early remission ([name]) 02/24/2022   Suicidal ideation 07/07/2021   Generalized anxiety disorder 07/07/2021   Other allergic rhinitis 09/09/2020   Adverse reaction to food, subsequent encounter 09/09/2020   History of bee sting allergy 09/09/2020   Alcohol  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) 03/19/2020   MDD (major depressive disorder), recurrent episode, severe ([name]) 03/18/2020   History of substance abuse ([name]) 01/04/2020   Severe recurrent major  depression without psychotic features ([name]) 12/10/2019   Tinea cruris 07/06/2019   Hemoglobin A1c less than 7.0% 07/06/2019   Muscle strain of wrist 04/05/2019   Pain in both wrists 04/05/2019   Anxiety 04/05/2019   Prediabetes 04/05/2019   MDD (major depressive disorder) 01/12/2019   Chest pain 10/25/2018   MDD (major depressive disorder), recurrent severe, without psychosis ([name]) 08/15/2018   Influenza A 07/17/2017   Lactic acidosis    Cough    Nausea and vomiting    Severe episode of recurrent major depressive disorder, without psychotic features ([name])    MDD (major depressive disorder), single episode, severe with psychotic features ([name]) 06/27/2017   MDD (major depressive disorder), severe ([name]) 06/23/2017   Essential hypertension 01/13/2016   Unstable angina ([name])    ED (erectile dysfunction) 02/15/2014   Atypical chest pain 12/14/2011   Allergy to contrast media (used for diagnostic x-rays) 05/12/2011   Dyslipidemia, goal LDL below 70 05/09/2011   DM2 (diabetes mellitus, type 2) ([name]) 07/10/2010   CAD (coronary artery disease) 07/10/2010

## 2024-01-26 NOTE — ED Notes (Signed)
Patient is still sleeping. 

## 2024-01-26 NOTE — Group Note (Signed)
 Group Topic: Social Support  Group Date: 01/26/2024 Start Time: 1700 End Time: 1715 Facilitators: Celinda Suzen NOVAK, NT  Department: [facility]  Number of Participants: 7  Group Focus: check in Treatment Modality:  Psychoeducation Interventions utilized were support Purpose: increase insight  Name: BHAVYA GRAND Date of Birth: 07/30/1960  MR: [id number]    Level of Participation: when cued Quality of Participation: attentive Interactions with others: quiet Mood/Affect: appropriate Triggers (if applicable): n/a Cognition: coherent/clear Progress: Gaining insight Response: n/a Plan: patient will be encouraged to attend group  Patients Problems:  Patient Active Problem List   Diagnosis Date Noted   Alcohol  use disorder, severe, dependence ([name]) 01/25/2024   Scrotal pain, right 08/26/2023   Epididymitis, right 08/26/2023   Complex renal cyst, right, 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/26/2023   Impingement syndrome of right shoulder 07/01/2023   Pain in right shoulder 06/16/2023   Anxiety disorder, unspecified 02/16/2023   Alcohol  use disorder 02/16/2023   Sleep disorder, unspecified 02/16/2023   Alcohol  abuse 02/15/2023   Alcohol  dependence ([name]) 05/16/2022   Low energy 03/06/2022   Moderate alcohol  dependence in early remission ([name]) 02/24/2022   Suicidal ideation 07/07/2021   Generalized anxiety disorder 07/07/2021   Other allergic rhinitis 09/09/2020   Adverse reaction to food, subsequent encounter 09/09/2020   History of bee sting allergy 09/09/2020   Alcohol  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) 03/19/2020   MDD (major depressive disorder), recurrent episode, severe ([name]) 03/18/2020   History of substance abuse ([name]) 01/04/2020   Severe 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 features ([name]) 12/10/2019   Tinea cruris 07/06/2019   Hemoglobin A1c less than 7.0% 07/06/2019   Muscle strain of wrist 04/05/2019   Pain in both wrists 04/05/2019    Anxiety 04/05/2019   Prediabetes 04/05/2019   MDD (major depressive disorder) 01/12/2019   Chest pain 10/25/2018   MDD (major depressive disorder), recurrent severe, without psychosis ([name]) 08/15/2018   Influenza A 07/17/2017   Lactic acidosis    Cough    Nausea and vomiting    Severe episode of recurrent major depressive disorder, without psychotic features ([name])    MDD (major depressive disorder), single episode, severe with psychotic features ([name]) 06/27/2017   MDD (major depressive disorder), severe ([name]) 06/23/2017   Essential hypertension 01/13/2016   Unstable angina ([name])    ED (erectile dysfunction) 02/15/2014   Atypical chest pain 12/14/2011   Allergy to contrast media (used for diagnostic x-rays) 05/12/2011   Dyslipidemia, goal LDL below 70 05/09/2011   DM2 (diabetes mellitus, type 2) ([name]) 07/10/2010   CAD (coronary artery disease) 07/10/2010

## 2024-01-26 NOTE — Discharge Planning (Signed)
 Per provider, patient is anticipated to be stable for DC back to home on Friday 8/1 following completion of his librium  taper. Patient has outpatient services in place he will resume.

## 2024-01-26 NOTE — Group Note (Signed)
 Group Topic: Relapse and Recovery  Group Date: 01/26/2024 Start Time: 2000 End Time: 2100 Facilitators: Joan Plowman B  Department: [facility]  Number of Participants: 6  Group Focus: abuse issues, activities of daily living skills, chemical dependency education, chemical dependency issues, communication, community group, coping skills, daily focus, and relapse prevention Treatment Modality:  Leisure Counsellor, Patient-Centered Therapy, and Spiritual Interventions utilized were leisure development, story telling, and support Purpose: enhance coping skills, express feelings, increase insight, regain self-worth, relapse prevention strategies, and trigger / craving management  Name: [name] Date of Birth: Jul 18, 1960  MR: [id number]    Level of Participation: active Quality of Participation: attentive and cooperative Interactions with others: gave feedback Mood/Affect: appropriate Triggers (if applicable): NA Cognition: coherent/clear Progress: Gaining insight Response: NA Plan: patient will be encouraged to go to groups.   Patients Problems:  Patient Active Problem List   Diagnosis Date Noted   Alcohol  use disorder, severe, dependence ([name]) 01/25/2024   Scrotal pain, right 08/26/2023   Epididymitis, right 08/26/2023   Complex renal cyst, right, 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/26/2023   Impingement syndrome of right shoulder 07/01/2023   Pain in right shoulder 06/16/2023   Anxiety disorder, unspecified 02/16/2023   Alcohol  use disorder 02/16/2023   Sleep disorder, unspecified 02/16/2023   Alcohol  abuse 02/15/2023   Alcohol  dependence ([name]) 05/16/2022   Low energy 03/06/2022   Moderate alcohol  dependence in early remission ([name]) 02/24/2022   Suicidal ideation 07/07/2021   Generalized anxiety disorder 07/07/2021   Other allergic rhinitis 09/09/2020   Adverse reaction to food, subsequent encounter 09/09/2020   History of bee sting allergy 09/09/2020    Alcohol  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) 03/19/2020   MDD (major depressive disorder), recurrent episode, severe ([name]) 03/18/2020   History of substance abuse ([name]) 01/04/2020   Severe 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 features ([name]) 12/10/2019   Tinea cruris 07/06/2019   Hemoglobin A1c less than 7.0% 07/06/2019   Muscle strain of wrist 04/05/2019   Pain in both wrists 04/05/2019   Anxiety 04/05/2019   Prediabetes 04/05/2019   MDD (major depressive disorder) 01/12/2019   Chest pain 10/25/2018   MDD (major depressive disorder), recurrent severe, without psychosis ([name]) 08/15/2018   Influenza A 07/17/2017   Lactic acidosis    Cough    Nausea and vomiting    Severe episode of recurrent major depressive disorder, without psychotic features ([name])    MDD (major depressive disorder), single episode, severe with psychotic features ([name]) 06/27/2017   MDD (major depressive disorder), severe ([name]) 06/23/2017   Essential hypertension 01/13/2016   Unstable angina ([name])    ED (erectile dysfunction) 02/15/2014   Atypical chest pain 12/14/2011   Allergy to contrast media (used for diagnostic x-rays) 05/12/2011   Dyslipidemia, goal LDL below 70 05/09/2011   DM2 (diabetes mellitus, type 2) ([name]) 07/10/2010   CAD (coronary artery disease) 07/10/2010

## 2024-01-26 NOTE — ED Notes (Signed)
 Patient is sleeping no disturbance of any sort observed.

## 2024-01-26 NOTE — ED Notes (Signed)
 Patient observed/assessed at bedside lying in bed asleep. Patient alert and oriented to self and location. Affect is flat. Patient denies pain and anxiety. He denies A/V/H. He denies having any thoughts/plan of self harm and harm towards others. Fluid and snack offered. Patient states that appetite has been good throughout the day. Verbalizes no further complaints at this time.

## 2024-01-27 DIAGNOSIS — I10 Essential (primary) hypertension: Secondary | ICD-10-CM | POA: Diagnosis not present

## 2024-01-27 DIAGNOSIS — F1020 Alcohol dependence, uncomplicated: Secondary | ICD-10-CM | POA: Diagnosis not present

## 2024-01-27 DIAGNOSIS — Z7984 Long term (current) use of oral hypoglycemic drugs: Secondary | ICD-10-CM | POA: Diagnosis not present

## 2024-01-27 DIAGNOSIS — F411 Generalized anxiety disorder: Secondary | ICD-10-CM | POA: Diagnosis not present

## 2024-01-27 DIAGNOSIS — I252 Old myocardial infarction: Secondary | ICD-10-CM | POA: Diagnosis not present

## 2024-01-27 DIAGNOSIS — E1159 Type 2 diabetes mellitus with other circulatory complications: Secondary | ICD-10-CM | POA: Diagnosis not present

## 2024-01-27 LAB — GLUCOSE, CAPILLARY
Glucose-Capillary: 162 mg/dL — ABNORMAL HIGH (ref 70–99)
Glucose-Capillary: 126 mg/dL — ABNORMAL HIGH (ref 70–99)
Glucose-Capillary: 214 mg/dL — ABNORMAL HIGH (ref 70–99)
Glucose-Capillary: 204 mg/dL — ABNORMAL HIGH (ref 70–99)

## 2024-01-27 MED ORDER — GABAPENTIN 100 MG PO CAPS
200.0000 mg | ORAL_CAPSULE | Freq: Three times a day (TID) | ORAL | Status: DC
  Administered 2024-01-27 – 2024-01-28 (×3): 200 mg via ORAL
  Filled 2024-01-27 (×3): qty 2

## 2024-01-27 NOTE — Discharge Planning (Signed)
 Patient is anticipated for DC back to home o Friday 8/1 with cab voucher provided for 8:30 pick up.  Patient has a scheduled laminectomy procedure on 8/6 and will follow up with his PCP when able as recommended for ongoing medication management. Patient wishes to continue his outpatient services with AA meetings and current supports in place. Additional resources provided in patients AVS. No further concerns noted or reported.

## 2024-01-27 NOTE — ED Notes (Signed)
 Pt observed lying in bed. Eyes closed respirations even and non labored. NAD q 15 minute observations continue for safety.

## 2024-01-27 NOTE — Group Note (Signed)
 Group Topic: Recovery Basics  Group Date: 01/27/2024 Start Time: 2000 End Time: 2100 Facilitators: Anice Benton LABOR, NT  Department: [facility]  Number of Participants: 6  Group Focus: substance abuse education(AA Group) Treatment Modality:  Patient-Centered Therapy Interventions utilized were group exercise Purpose: relapse prevention strategies  Name: [name] Date of Birth: May 05, 1961  MR: [id number]    Level of Participation: active Quality of Participation: attentive Interactions with others: gave feedback Mood/Affect: appropriate Triggers (if applicable): None Cognition: coherent/clear Progress: Moderate Response: Good Plan: follow-up needed  Patients Problems:  Patient Active Problem List   Diagnosis Date Noted   Alcohol  use disorder, severe, dependence ([name]) 01/25/2024   Scrotal pain, right 08/26/2023   Epididymitis, right 08/26/2023   Complex renal cyst, right, 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/26/2023   Impingement syndrome of right shoulder 07/01/2023   Pain in right shoulder 06/16/2023   Anxiety disorder, unspecified 02/16/2023   Alcohol  use disorder 02/16/2023   Sleep disorder, unspecified 02/16/2023   Alcohol  abuse 02/15/2023   Alcohol  dependence ([name]) 05/16/2022   Low energy 03/06/2022   Moderate alcohol  dependence in early remission ([name]) 02/24/2022   Suicidal ideation 07/07/2021   Generalized anxiety disorder 07/07/2021   Other allergic rhinitis 09/09/2020   Adverse reaction to food, subsequent encounter 09/09/2020   History of bee sting allergy 09/09/2020   Alcohol  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) 03/19/2020   MDD (major depressive disorder), recurrent episode, severe ([name]) 03/18/2020   History of substance abuse ([name]) 01/04/2020   Severe 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 features ([name]) 12/10/2019   Tinea cruris 07/06/2019   Hemoglobin A1c less than 7.0% 07/06/2019   Muscle strain of wrist 04/05/2019    Pain in both wrists 04/05/2019   Anxiety 04/05/2019   Prediabetes 04/05/2019   MDD (major depressive disorder) 01/12/2019   Chest pain 10/25/2018   MDD (major depressive disorder), recurrent severe, without psychosis ([name]) 08/15/2018   Influenza A 07/17/2017   Lactic acidosis    Cough    Nausea and vomiting    Severe episode of recurrent major depressive disorder, without psychotic features ([name])    MDD (major depressive disorder), single episode, severe with psychotic features ([name]) 06/27/2017   MDD (major depressive disorder), severe ([name]) 06/23/2017   Essential hypertension 01/13/2016   Unstable angina ([name])    ED (erectile dysfunction) 02/15/2014   Atypical chest pain 12/14/2011   Allergy to contrast media (used for diagnostic x-rays) 05/12/2011   Dyslipidemia, goal LDL below 70 05/09/2011   DM2 (diabetes mellitus, type 2) ([name]) 07/10/2010   CAD (coronary artery disease) 07/10/2010

## 2024-01-27 NOTE — Discharge Instructions (Addendum)
 Based on the information you have provided and the presenting issue, outpatient services and resources have been recommended.  It is imperative that you follow through with treatment recommendations within 5-7 days from the of discharge to mitigate further risk to your safety and mental well-being. A list of referrals has been provided below to get you started.  You are not limited to the list provided.  In case of an urgent crisis, you may contact the Mobile Crisis Unit with [facility] at [phone].   Recommended to follow up with primary care for ongoing medication management. A list of AA meetings (virtual) provided below and with meeting schedule brochure provided with this paperwork. Intensive outpatient program services are available for walk in -assessments upstairs on second floor and listed below.   Follow up resources for AA groups are provided below    About A.A. People who think they have a drinking problem are welcome to attend any A.A. meeting. They can sit and listen and learn more about recovery. Or they can share about themselves. It's completely up to them. Everyone is welcome. You don't have to pay anything to attend.   Our members are not professionals on alcoholism or addiction of any kind. We do not provide medical or psychological diagnoses or prognoses. We leave that up to you. A.A. is a supplemental resource. We offer ongoing support for anyone who has a drinking problem based on a solution that works for us .   Meetings are classified as "open" or "closed". Professionals are welcome to observe "open" A.A. meetings. "Open" meetings are for anyone interested in learning more about what happens in A.A. "Closed" meetings are for only those who have a desire to stop drinking. Find an open meeting here.     A.A. Resources   Resources from A.A. in [place]  Contact an A.A. Coordinator for more information: [email or web address] or  [email or web address] [email or web address]. We look forward to hearing from you. [place] [facility].A. Meetings AA support, including regional helplines, is at the local level. Find local AA information: [employer]fi       AA Meeting Early [facility]  Location:: Address [address] [place], [place], [address]   Weekly Meeting Schedule MONDAY, 8:00 AM - 9:00 AM Early [place] Discussion Open Virtual Join Early [facility] Online AA Meeting Password: [phone]   TUESDAY, 8:00 AM - 9:00 AM Early [place] Discussion Open Virtual Join Early [facility] Online AA Meeting Password: [phone]   [facility], 8:00 AM - 9:00 AM Early [place] Discussion Open Virtual Join Early [facility] Online AA Meeting Password: [phone]   THURSDAY, 8:00 AM - 9:00 AM Early [place] Discussion Open Virtual Join Early [facility] Online AA Meeting Password: [phone]   FRIDAY, 8:00 AM - 9:00 AM Early [place] Discussion Open Virtual Join Early [facility] Online AA Meeting Password: [phone]   SATURDAY, 8:00 AM - 9:00 AM Early [place] Discussion Open Virtual Join Early [facility] Online AA Meeting Password: [phone]   SUNDAY, 8:00 AM - 9:00 AM Early [place] Discussion Open Virtual Join Early [facility] Online AA Meeting Password: [phone]      Non-Emergent / Urgent   [facility] [address] SECOND FLOOR [place], [place] [address] [phone] OUTPATIENT Walk-in information: Please note, all walk-ins are first come & first serve, with limited number of availability.   Please note that to be eligible for services you must bring: ID or a piece of mail with your name [facility] address   Therapist for therapy:  Monday & Wednesdays: Please ARRIVE at 7:15 AM for registration Will START at 8:00 AM Every 1st & 2nd Friday of the month: Please ARRIVE at 10:15 AM for registration Will START at 1 PM - 5 PM   Psychiatrist for  medication management: Monday - Friday:  Please ARRIVE at 7:15 AM for registration Will START at 8:00 AM   Regretfully, due to limited availability, please be aware that you may not been seen on the same day as walk-in. Please consider making an appoint or try again. Thank you for your patience and understanding. ________________________________________________________   [facility] URGENT [facility]ARE:  [address] FIRST FLOOR.  [place], [place] [address].  [phone]                     [facility] CD-IOP [facility]   Specialized Group Therapy for Substance Abuse If you have a substance abuse disorder (with or without a mental health condition), you may benefit from specialized substance abuse therapy in a group setting. According to discharge survey data, 70 percent of patients completing our chemical dependency intensive outpatient program report fewer symptoms of substance abuse and incidents of relapse.  Under the guidance of a licensed mental health professional, meet with your peers every Monday, Wednesday and Friday from 9 a.m. to 12 p.m. for 6 to 8 weeks to:  Learn about chemical dependency, mental illness and co-occurring disorders. Develop relapse-prevention skills. Set personalized goals with your treatment team. To build on the skills you gain, you can attend Alcoholics Anonymous or Narcotics Anonymous meetings in the evenings and access follow-up care through weekly group meetings with peers. Ongoing support promotes wellness and recovery.  For more information, call Zell Maier, LCSW at [phone]. We work directly with employers and families to ensure you receive

## 2024-01-27 NOTE — ED Notes (Signed)
 Pt is calm and cooperative.  Librium  taper for ETOH withdrawals continues.  Pt denies any withdrawal symptoms.  Pt observed in nutrition group Q 15 minute observations for safety continue

## 2024-01-27 NOTE — ED Notes (Signed)
 Pt observable in groups and milieu.  Calm and pleasant.  Pt shares discharge on Saturday, reports he needs to go and feed his fish No reports or observations of ETOH withdrawals Q 15 minute observations for safety continue

## 2024-01-27 NOTE — ED Provider Notes (Signed)
 Behavioral Health Progress Note  Date and Time: 01/27/2024 4:06 PM Name: [name] MRN:  [id number]  Subjective:  Patinet tolerating Librium  taper well and CIWA of 1. Patient reports chronic back pain and would like to further increase gabapentin  and denies any side effects. Patient denies SI, HI or AVH. Reports chronic back pain and anxiety and states this is why he relapsed on alcohol . Denies depression. States he is supposed to get a laminectomy on 7/6. Reports fair sleep and appetite and states he is motivated to maintain sobriety.  Diagnosis:  Final diagnoses:  Alcohol  use disorder, severe, dependence ([name])  GAD (generalized anxiety disorder)    Total Time spent with patient: 20 minutes  Past Psychiatric History: MDD, GAD, AUD  Past Medical History:  Past Medical History:  Diagnosis Date   Alcohol  dependence ([name])    Allergies    Arthritis    Chest pain    Chronic lower back pain    Chronic pain of right wrist    Cocaine abuse ([name])    per note on 01/18/23   Coronary artery disease    a. Multiple prior caths/PCI. Cath 2013 with possible spasm of RCA, 70% ISR of mid LCx with subsequent DES to mLCx and prox LCX. b. H/o microvascular angina. c. Recurrent angina 08/2014 - s/p PTCA/DES to prox Cx, PTCA/CBA to OM1.  c. LHC 06/10/15 with patent stents and some ISR in LCX and OM-1 that was not flow limiting --> Rx    Dyslipidemia    a. Intolerant to many statins except tolerating Livalo .   GERD (gastroesophageal reflux disease)    H/O cardiac catheterization 10/25/2018   Heart attack [facility])    Hypertension    Myocardial infarction [facility]) ~ 2010   Renal mass    S/P angioplasty with stent, DES, to proximal and mid LCX 12/15/11 12/15/2011   S/P foot surgery, right 04/2021   Shoulder pain    Stroke ([name])    pt. reports had a stroke around time of MI 2010   Type II diabetes mellitus ([name])    Unstable angina ([name])     Family History:  Family History  Problem Relation Age of Onset    Leukemia Mother    Prostate cancer Father    Cancer Brother    Coronary artery disease Paternal Grandmother    Cancer Paternal Grandfather    Migraines Neg Hx    Headache Neg Hx    Colon cancer Neg Hx    Esophageal cancer Neg Hx    Rectal cancer Neg Hx    Stomach cancer Neg Hx    Denies history of suicide attempts  Social History:  Socioeconomic History   Marital status: Widowed      Spouse name: Not on file   Number of children: 2   Years of education: GED   Highest education level: Some college, no degree  Occupational History   Not on file  Tobacco Use   Smoking status: Former      Current packs/day: 0.00      Average packs/day: 1 pack/day for 10.0 years (10.0 ttl pk-yrs)      Types: Cigarettes      Start date: 10/06/2008      Quit date: 10/07/2018      Years since quitting: 5.3   Smokeless tobacco: Never  Vaping Use   Vaping status: Never Used  Substance and Sexual Activity   Alcohol  use: Not Currently      Comment: 05/28/2023-stopped drinking 22  days ago.   Drug use: Not Currently      Types: Cocaine   Sexual activity: Not Currently    Additional Social History:                         Sleep: Fair  Appetite:  Fair  Current Medications:  Current Facility-Administered Medications  Medication Dose Route Frequency Provider Last Rate Last Admin   acetaminophen  (TYLENOL ) tablet 650 mg  650 mg Oral Q6H PRN Arloa Suzen RAMAN, NP   650 mg at 01/27/24 1132   alum & mag hydroxide-simeth (MAALOX/MYLANTA) 200-200-20 MG/5ML suspension 30 mL  30 mL Oral Q4H PRN Arloa Suzen RAMAN, NP       amLODipine  (NORVASC ) tablet 10 mg  10 mg Oral Daily Arloa Suzen RAMAN, NP   10 mg at 01/27/24 9167   artificial tears ophthalmic solution 2 drop  2 drop Both Eyes Daily PRN Julieta Rogalski, MD       aspirin  EC tablet 81 mg  81 mg Oral Daily Arloa Suzen RAMAN, NP   81 mg at 01/27/24 9167   chlordiazePOXIDE  (LIBRIUM ) capsule 25 mg  25 mg Oral Q6H PRN Arloa Suzen RAMAN, NP        chlordiazePOXIDE  (LIBRIUM ) capsule 25 mg  25 mg Oral Letha Arloa Suzen RAMAN, NP       Followed by   NOREEN ON 01/28/2024] chlordiazePOXIDE  (LIBRIUM ) capsule 25 mg  25 mg Oral Daily Arloa Suzen RAMAN, NP       clopidogrel  (PLAVIX ) tablet 75 mg  75 mg Oral Daily Arloa Suzen RAMAN, NP   75 mg at 01/27/24 9167   EPINEPHrine  (EPI-PEN) injection 0.3 mg  0.3 mg Intramuscular PRN Jeanean Hollett, MD       ezetimibe  (ZETIA ) tablet 10 mg  10 mg Oral Daily Arloa Suzen RAMAN, NP   10 mg at 01/27/24 9167   feeding supplement (GLUCERNA SHAKE) (GLUCERNA SHAKE) liquid 237 mL  237 mL Oral BID PRN Arloa Suzen RAMAN, NP   237 mL at 01/26/24 1002   gabapentin  (NEURONTIN ) capsule 200 mg  200 mg Oral TID Aadit Hagood, MD       hydrOXYzine  (ATARAX ) tablet 25 mg  25 mg Oral TID PRN Arloa Suzen RAMAN, NP   25 mg at 01/25/24 1825   insulin  aspart (novoLOG ) injection 0-4 Units  0-4 Units Subcutaneous TID WC Arloa Suzen RAMAN, NP   0 Units at 01/26/24 1201   loperamide  (IMODIUM ) capsule 2-4 mg  2-4 mg Oral PRN Arloa Suzen RAMAN, NP       magnesium  hydroxide (MILK OF MAGNESIA) suspension 30 mL  30 mL Oral Daily PRN Arloa Suzen RAMAN, NP   30 mL at 01/26/24 9061   metFORMIN  (GLUCOPHAGE ) tablet 500 mg  500 mg Oral BID WC Arloa Suzen RAMAN, NP   500 mg at 01/27/24 0831   metoprolol  tartrate (LOPRESSOR ) tablet 50 mg  50 mg Oral BID Arloa Suzen RAMAN, NP   50 mg at 01/27/24 9167   multivitamin with minerals tablet 1 tablet  1 tablet Oral Daily Arloa Suzen RAMAN, NP   1 tablet at 01/27/24 9167   nitroGLYCERIN  (NITROSTAT ) SL tablet 0.4 mg  0.4 mg Sublingual Q5 Min x 3 PRN Arloa Suzen RAMAN, NP       OLANZapine  (ZYPREXA ) injection 5 mg  5 mg Intramuscular TID PRN Arloa Suzen RAMAN, NP       OLANZapine  zydis (ZYPREXA ) disintegrating tablet 5 mg  5 mg Oral  TID PRN Arloa Suzen RAMAN, NP       ondansetron  (ZOFRAN -ODT) disintegrating tablet 4 mg  4 mg Oral Q6H PRN Arloa Suzen RAMAN, NP       ondansetron  (ZOFRAN -ODT)  disintegrating tablet 4 mg  4 mg Oral Q6H PRN Arloa Suzen RAMAN, NP       traZODone  (DESYREL ) tablet 50 mg  50 mg Oral QHS PRN Arloa Suzen RAMAN, NP   50 mg at 01/26/24 2137   Current Outpatient Medications  Medication Sig Dispense Refill   ACCU-CHEK GUIDE TEST test strip USE 1 STRIP TO CHECK GLUCOSE THREE TIMES DAILY AS DIRECTED 300 each 0   amLODipine  (NORVASC ) 10 MG tablet Take 1 tablet (10 mg total) by mouth daily. 90 tablet 0   aspirin  EC 81 MG tablet Take 81 mg by mouth daily.     busPIRone  (BUSPAR ) 10 MG tablet Take 1 tablet (10 mg total) by mouth 3 (three) times daily. (Patient not taking: Reported on 01/25/2024) 30 tablet 2   Cholecalciferol  25 MCG (1000 UT) tablet Take 1,000 Units by mouth daily.     clopidogrel  (PLAVIX ) 75 MG tablet Take 1 tablet by mouth once daily 90 tablet 0   cyclobenzaprine  (FLEXERIL ) 10 MG tablet Take 1 tablet (10 mg total) by mouth 3 (three) times daily as needed for muscle spasms. (Patient not taking: Reported on 01/25/2024) 30 tablet 0   EPINEPHrine  0.3 mg/0.3 mL IJ SOAJ injection Inject 0.3 mg into the muscle as needed for anaphylaxis. 1 each 2   ezetimibe  (ZETIA ) 10 MG tablet Take 1 tablet (10 mg total) by mouth daily. 90 tablet 0   fluticasone  (FLONASE ) 50 MCG/ACT nasal spray Place 2 sprays into both nostrils daily. (Patient not taking: Reported on 01/25/2024) 9.9 mL 2   gabapentin  (NEURONTIN ) 300 MG capsule Take 300 mg by mouth 2 (two) times daily. (Patient not taking: Reported on 01/25/2024)     LIVALO  4 MG TABS Take 1 tablet by mouth once daily (Patient taking differently: Take 4 mg by mouth daily.) 90 tablet 3   metFORMIN  (GLUCOPHAGE ) 500 MG tablet Take 500 mg by mouth 2 (two) times daily with a meal.     metoprolol  tartrate (LOPRESSOR ) 50 MG tablet Take 1 tablet (50 mg total) by mouth 2 (two) times daily. 180 tablet 3   Multiple Vitamin (MULTIVITAMIN WITH MINERALS) TABS tablet Take 1 tablet by mouth daily.     nitroGLYCERIN  (NITROSTAT ) 0.4 MG SL tablet  Place 1 tablet (0.4 mg total) under the tongue every 5 (five) minutes x 3 doses as needed for chest pain. (Patient not taking: Reported on 01/25/2024) 25 tablet 3   Polyethyl Glycol-Propyl Glycol (SYSTANE) 0.4-0.3 % GEL ophthalmic gel Place 1 Application into both eyes daily as needed (For dry eyes).      Labs  Lab Results:  Admission on 01/25/2024  Component Date Value Ref Range Status   Glucose-Capillary 01/26/2024 177 (H)  70 - 99 mg/dL Final   Glucose reference range applies only to samples taken after fasting for at least 8 hours.   Glucose-Capillary 01/26/2024 151 (H)  70 - 99 mg/dL Final   Glucose reference range applies only to samples taken after fasting for at least 8 hours.   Glucose-Capillary 01/26/2024 146 (H)  70 - 99 mg/dL Final   Glucose reference range applies only to samples taken after fasting for at least 8 hours.   Glucose-Capillary 01/27/2024 162 (H)  70 - 99 mg/dL Final   Glucose reference range applies  only to samples taken after fasting for at least 8 hours.   Glucose-Capillary 01/27/2024 126 (H)  70 - 99 mg/dL Final   Glucose reference range applies only to samples taken after fasting for at least 8 hours.  Admission on 01/24/2024, Discharged on 01/25/2024  Component Date Value Ref Range Status   WBC 01/24/2024 6.1  4.0 - 10.5 K/uL Final   RBC 01/24/2024 4.92  4.22 - 5.81 MIL/uL Final   Hemoglobin 01/24/2024 13.9  13.0 - 17.0 g/dL Final   HCT 92/71/7974 41.2  39.0 - 52.0 % Final   MCV 01/24/2024 83.7  80.0 - 100.0 fL Final   MCH 01/24/2024 28.3  26.0 - 34.0 pg Final   MCHC 01/24/2024 33.7  30.0 - 36.0 g/dL Final   RDW 92/71/7974 13.2  11.5 - 15.5 % Final   Platelets 01/24/2024 261  150 - 400 K/uL Final   nRBC 01/24/2024 0.0  0.0 - 0.2 % Final   Neutrophils Relative % 01/24/2024 44  % Final   Neutro Abs 01/24/2024 2.7  1.7 - 7.7 K/uL Final   Lymphocytes Relative 01/24/2024 40  % Final   Lymphs Abs 01/24/2024 2.4  0.7 - 4.0 K/uL Final   Monocytes Relative  01/24/2024 7  % Final   Monocytes Absolute 01/24/2024 0.4  0.1 - 1.0 K/uL Final   Eosinophils Relative 01/24/2024 8  % Final   Eosinophils Absolute 01/24/2024 0.5  0.0 - 0.5 K/uL Final   Basophils Relative 01/24/2024 1  % Final   Basophils Absolute 01/24/2024 0.1  0.0 - 0.1 K/uL Final   Immature Granulocytes 01/24/2024 0  % Final   Abs Immature Granulocytes 01/24/2024 0.02  0.00 - 0.07 K/uL Final   Performed at [facility], [address], [place], [place] [address]   Sodium 01/24/2024 136  135 - 145 mmol/L Final   Potassium 01/24/2024 4.1  3.5 - 5.1 mmol/L Final   Chloride 01/24/2024 104  98 - 111 mmol/L Final   CO2 01/24/2024 21 (L)  22 - 32 mmol/L Final   Glucose, Bld 01/24/2024 213 (H)  70 - 99 mg/dL Final   Glucose reference range applies only to samples taken after fasting for at least 8 hours.   BUN 01/24/2024 9  8 - 23 mg/dL Final   Creatinine, Ser 01/24/2024 0.83  0.61 - 1.24 mg/dL Final   Calcium  01/24/2024 9.3  8.9 - 10.3 mg/dL Final   Total Protein 92/71/7974 6.5  6.5 - 8.1 g/dL Final   Albumin 92/71/7974 4.1  3.5 - 5.0 g/dL Final   AST 92/71/7974 23  15 - 41 U/L Final   ALT 01/24/2024 36  0 - 44 U/L Final   Alkaline Phosphatase 01/24/2024 61  38 - 126 U/L Final   Total Bilirubin 01/24/2024 0.7  0.0 - 1.2 mg/dL Final   GFR, Estimated 01/24/2024 >60  >60 mL/min Final   Comment: (NOTE) Calculated using the CKD-EPI Creatinine Equation (2021)    Anion gap 01/24/2024 11  5 - 15 Final   Performed at [facility], [address], [place], [place] [address]   Alcohol , Ethyl (B) 01/24/2024 <15  <15 mg/dL Final   Comment: (NOTE) For medical purposes only. Performed at [facility], [address], [place], [place] [address]    POC Amphetamine UR 01/24/2024 None Detected  NONE DETECTED (Cut Off Level 1000 ng/mL) Final   POC Secobarbital (BAR) 01/24/2024 None Detected  NONE DETECTED (Cut Off Level 300 ng/mL)  Final   POC Buprenorphine (BUP) 01/24/2024 None  Detected  NONE DETECTED (Cut Off Level 10 ng/mL) Final   POC Oxazepam (BZO) 01/24/2024 None Detected  NONE DETECTED (Cut Off Level 300 ng/mL) Final   POC Cocaine UR 01/24/2024 None Detected  NONE DETECTED (Cut Off Level 300 ng/mL) Final   POC Methamphetamine UR 01/24/2024 None Detected  NONE DETECTED (Cut Off Level 1000 ng/mL) Final   POC Morphine  01/24/2024 None Detected  NONE DETECTED (Cut Off Level 300 ng/mL) Final   POC Methadone UR 01/24/2024 None Detected  NONE DETECTED (Cut Off Level 300 ng/mL) Final   POC Oxycodone  UR 01/24/2024 None Detected  NONE DETECTED (Cut Off Level 100 ng/mL) Final   POC Marijuana UR 01/24/2024 None Detected  NONE DETECTED (Cut Off Level 50 ng/mL) Final   Glucose-Capillary 01/24/2024 217 (H)  70 - 99 mg/dL Final   Glucose reference range applies only to samples taken after fasting for at least 8 hours.   Glucose-Capillary 01/25/2024 150 (H)  70 - 99 mg/dL Final   Glucose reference range applies only to samples taken after fasting for at least 8 hours.  Office Visit on 12/23/2023  Component Date Value Ref Range Status   Creatinine, Urine 12/23/2023 225.4  Not Estab. mg/dL Final   Microalbumin, Urine 12/23/2023 20.3  Not Estab. ug/mL Final   Microalb/Creat Ratio 12/23/2023 9  0 - 29 mg/g creat Final   Comment:                        Normal:                0 -  29                        Moderately increased: 30 - 300                        Severely increased:       >300    Hemoglobin A1C 12/23/2023 7.2 (A)  4.0 - 5.6 % Final   WBC 12/23/2023 5.4  3.4 - 10.8 x10E3/uL Final   RBC 12/23/2023 5.57  4.14 - 5.80 x10E6/uL Final   Hemoglobin 12/23/2023 15.4  13.0 - 17.7 g/dL Final   Hematocrit 93/73/7974 48.4  37.5 - 51.0 % Final   MCV 12/23/2023 87  79 - 97 fL Final   MCH 12/23/2023 27.6  26.6 - 33.0 pg Final   MCHC 12/23/2023 31.8  31.5 - 35.7 g/dL Final   RDW 93/73/7974 13.0  11.6 - 15.4 % Final   Platelets 12/23/2023 248  150 - 450 x10E3/uL Final   Glucose  12/23/2023 140 (H)  70 - 99 mg/dL Final   BUN 93/73/7974 20  8 - 27 mg/dL Final   Creatinine, Ser 12/23/2023 1.06  0.76 - 1.27 mg/dL Final   eGFR 93/73/7974 79  >59 mL/min/1.73 Final   BUN/Creatinine Ratio 12/23/2023 19  10 - 24 Final   Sodium 12/23/2023 137  134 - 144 mmol/L Final   Potassium 12/23/2023 4.7  3.5 - 5.2 mmol/L Final   Chloride 12/23/2023 101  96 - 106 mmol/L Final   CO2 12/23/2023 18 (L)  20 - 29 mmol/L Final   Calcium  12/23/2023 10.3 (H)  8.6 - 10.2 mg/dL Final   Total Protein 93/73/7974 7.2  6.0 - 8.5 g/dL Final   Albumin 93/73/7974 4.9  3.9 - 4.9 g/dL Final   Globulin,  Total 12/23/2023 2.3  1.5 - 4.5 g/dL Final   Bilirubin Total 12/23/2023 0.7  0.0 - 1.2 mg/dL Final   Alkaline Phosphatase 12/23/2023 84  44 - 121 IU/L Final   AST 12/23/2023 50 (H)  0 - 40 IU/L Final   ALT 12/23/2023 61 (H)  0 - 44 IU/L Final   Cholesterol, Total 12/23/2023 156  100 - 199 mg/dL Final   Triglycerides 93/73/7974 109  0 - 149 mg/dL Final   HDL 93/73/7974 40  >39 mg/dL Final   VLDL Cholesterol Cal 12/23/2023 20  5 - 40 mg/dL Final   LDL Chol Calc (NIH) 12/23/2023 96  0 - 99 mg/dL Final   Chol/HDL Ratio 12/23/2023 3.9  0.0 - 5.0 ratio Final   Comment:                                   T. Chol/HDL Ratio                                             Men  Women                               1/2 Avg.Risk  3.4    3.3                                   Avg.Risk  5.0    4.4                                2X Avg.Risk  9.6    7.1                                3X Avg.Risk 23.4   11.0    Prostate Specific Ag, Serum 12/23/2023 1.0  0.0 - 4.0 ng/mL Final   Comment: Roche ECLIA methodology. According to the American Urological Association, Serum PSA should decrease and remain at undetectable levels after radical prostatectomy. The AUA defines biochemical recurrence as an initial PSA value 0.2 ng/mL or greater followed by a subsequent confirmatory PSA value 0.2 ng/mL or greater. Values obtained with  different assay methods or kits cannot be used interchangeably. Results cannot be interpreted as absolute evidence of the presence or absence of malignant disease.   Office Visit on 09/23/2023  Component Date Value Ref Range Status   Specific Gravity, UA 09/23/2023 1.025  1.005 - 1.030 Final   pH, UA 09/23/2023 5.5  5.0 - 7.5 Final   Color, UA 09/23/2023 Yellow  Yellow Final   Appearance Ur 09/23/2023 Clear  Clear Final   Leukocytes,UA 09/23/2023 Negative  Negative Final   Protein,UA 09/23/2023 1+ (A)  Negative/Trace Final   Glucose, UA 09/23/2023 Negative  Negative Final   Ketones, UA 09/23/2023 Negative  Negative Final   RBC, UA 09/23/2023 Negative  Negative Final   Bilirubin, UA 09/23/2023 Negative  Negative Final   Urobilinogen, Ur 09/23/2023 0.2  0.2 - 1.0 mg/dL Final   Nitrite, UA 96/72/7974 Negative  Negative Final   Microscopic Examination 09/23/2023 See below:   Final  WBC, UA 09/23/2023 0-5  0 - 5 /hpf Final   RBC, Urine 09/23/2023 0-2  0 - 2 /hpf Final   Epithelial Cells (non renal) 09/23/2023 0-10  0 - 10 /hpf Final   Casts 09/23/2023 Present  None seen /lpf Final   Cast Type 09/23/2023 Hyaline casts  N/A Final   Bacteria, UA 09/23/2023 Few  None seen/Few Final  Office Visit on 08/27/2023  Component Date Value Ref Range Status   Hemoglobin A1C 08/27/2023 7.1 (A)  4.0 - 5.6 % Final  Office Visit on 08/26/2023  Component Date Value Ref Range Status   Specific Gravity, UA 08/26/2023 1.025  1.005 - 1.030 Final   pH, UA 08/26/2023 5.5  5.0 - 7.5 Final   Color, UA 08/26/2023 Orange  Yellow Final   Appearance Ur 08/26/2023 Clear  Clear Final   Leukocytes,UA 08/26/2023 Negative  Negative Final   Protein,UA 08/26/2023 1+ (A)  Negative/Trace Final   Glucose, UA 08/26/2023 Negative  Negative Final   Ketones, UA 08/26/2023 Trace (A)  Negative Final   RBC, UA 08/26/2023 Negative  Negative Final   Bilirubin, UA 08/26/2023 Negative  Negative Final   Urobilinogen, Ur 08/26/2023  1.0  0.2 - 1.0 mg/dL Final   Nitrite, UA 97/72/7974 Negative  Negative Final   Microscopic Examination 08/26/2023 See below:   Final   WBC, UA 08/26/2023 0-5  0 - 5 /hpf Final   RBC, Urine 08/26/2023 0-2  0 - 2 /hpf Final   Epithelial Cells (non renal) 08/26/2023 0-10  0 - 10 /hpf Final   Casts 08/26/2023 Present  None seen /lpf Final   Cast Type 08/26/2023 Hyaline casts  N/A Final   Mucus, UA 08/26/2023 Present (A)  Not Estab. Final   Bacteria, UA 08/26/2023 Few  None seen/Few Final  Admission on 08/13/2023, Discharged on 08/13/2023  Component Date Value Ref Range Status   Sodium 08/13/2023 137  135 - 145 mmol/L Final   Potassium 08/13/2023 4.5  3.5 - 5.1 mmol/L Final   Chloride 08/13/2023 107  98 - 111 mmol/L Final   CO2 08/13/2023 22  22 - 32 mmol/L Final   Glucose, Bld 08/13/2023 137 (H)  70 - 99 mg/dL Final   Glucose reference range applies only to samples taken after fasting for at least 8 hours.   BUN 08/13/2023 13  8 - 23 mg/dL Final   Creatinine, Ser 08/13/2023 1.02  0.61 - 1.24 mg/dL Final   Calcium  08/13/2023 9.3  8.9 - 10.3 mg/dL Final   Total Protein 97/85/7974 7.0  6.5 - 8.1 g/dL Final   Albumin 97/85/7974 4.3  3.5 - 5.0 g/dL Final   AST 97/85/7974 23  15 - 41 U/L Final   ALT 08/13/2023 32  0 - 44 U/L Final   Alkaline Phosphatase 08/13/2023 46  38 - 126 U/L Final   Total Bilirubin 08/13/2023 0.6  0.0 - 1.2 mg/dL Final   GFR, Estimated 08/13/2023 >60  >60 mL/min Final   Comment: (NOTE) Calculated using the CKD-EPI Creatinine Equation (2021)    Anion gap 08/13/2023 8  5 - 15 Final   Performed at [facility], [address] [place], [place] [address]   WBC 08/13/2023 5.8  4.0 - 10.5 K/uL Final   RBC 08/13/2023 5.35  4.22 - 5.81 MIL/uL Final   Hemoglobin 08/13/2023 14.8  13.0 - 17.0 g/dL Final   HCT 97/85/7974 45.0  39.0 - 52.0 % Final   MCV 08/13/2023 84.1  80.0 -  100.0 fL Final   MCH 08/13/2023 27.7  26.0 - 34.0 pg Final   MCHC 08/13/2023 32.9  30.0 - 36.0  g/dL Final   RDW 97/85/7974 12.8  11.5 - 15.5 % Final   Platelets 08/13/2023 219  150 - 400 K/uL Final   nRBC 08/13/2023 0.0  0.0 - 0.2 % Final   Performed at [facility], [address] [place], [place] [address]   Color, Urine 08/13/2023 AMBER (A)  YELLOW Final   BIOCHEMICALS MAY BE AFFECTED BY COLOR   APPearance 08/13/2023 HAZY (A)  CLEAR Final   Specific Gravity, Urine 08/13/2023 1.027  1.005 - 1.030 Final   pH 08/13/2023 5.0  5.0 - 8.0 Final   Glucose, UA 08/13/2023 NEGATIVE  NEGATIVE mg/dL Final   Hgb urine dipstick 08/13/2023 NEGATIVE  NEGATIVE Final   Bilirubin Urine 08/13/2023 NEGATIVE  NEGATIVE Final   Ketones, ur 08/13/2023 NEGATIVE  NEGATIVE mg/dL Final   Protein, ur 97/85/7974 30 (A)  NEGATIVE mg/dL Final   Nitrite 97/85/7974 NEGATIVE  NEGATIVE Final   Leukocytes,Ua 08/13/2023 NEGATIVE  NEGATIVE Final   RBC / HPF 08/13/2023 0-5  0 - 5 RBC/hpf Final   WBC, UA 08/13/2023 0-5  0 - 5 WBC/hpf Final   Bacteria, UA 08/13/2023 NONE SEEN  NONE SEEN Final   Squamous Epithelial / HPF 08/13/2023 0-5  0 - 5 /HPF Final   Mucus 08/13/2023 PRESENT   Final   Hyaline Casts, UA 08/13/2023 PRESENT   Final   Ca Oxalate Crys, UA 08/13/2023 PRESENT   Final   Performed at [facility], [address] [place], [place] [address]   Neisseria Gonorrhea 08/13/2023 Negative   Final   Chlamydia 08/13/2023 Negative   Final   Comment 08/13/2023 Normal Reference Ranger Chlamydia - Negative   Final   Comment 08/13/2023 Normal Reference Range Neisseria Gonorrhea - Negative   Final    Blood Alcohol  level:  Lab Results  Component Value Date   [facility] <15 01/24/2024   ETH <10 02/15/2023    Metabolic Disorder Labs: Lab Results  Component Value Date   HGBA1C 7.2 (A) 12/23/2023   MPG 159.94 02/15/2023   MPG 142.72 08/07/2022   Lab Results  Component Value Date   PROLACTIN 13.0 03/17/2020   Lab Results  Component Value Date   CHOL 156 12/23/2023   TRIG 109 12/23/2023   HDL 40  12/23/2023   CHOLHDL 3.9 12/23/2023   VLDL 44 (H) 02/15/2023   LDLCALC 96 12/23/2023   LDLCALC 67 02/15/2023    Therapeutic Lab Levels: No results found for: LITHIUM No results found for: VALPROATE No results found for: CBMZ  Physical Findings   AIMS    Flowsheet Row Ad[facility] (Discharged) [facility] Ad[facility] (Discharged) from [facility] Admission (Discharged[facility] from [facility] Admission (Discharged) from [facility] Admission (Discharged) [facility]  AIMS Total Score 0 0 0 0 0   AUDIT    Flowsheet Row Office Visit from 11/16/2022 in [facility] Office Visit from 08/27/2020 in [facility] Admission (Discharged) from OP [facility] Ad[facility] (Discharged) [facility] Ad[facility] (Discharged) from [facility]  Alcohol  Use Disorder Identification Test Final Score (AUDIT) 24 27 28 13 25    GAD-7    Flowsheet Row Office Visit from 08/27/2023 in [facility] Counselor from 05/17/2023 in [facility] Office Visit from 02/15/2023 in [facility] Of [facility] Clinical Support from 02/24/2022 in [facility] Office Visit from 01/08/2022 in [facility]  Total GAD-7 Score 0 12 10 2 12    PHQ2-9    Flowsheet Row ED from 01/24/2024 in  [facility] Office Visit from 12/23/2023 in [facility] - A Dept Of Jolynn DEL [facility] Office Visit from 08/27/2023 in [facility] - A Dept Of Jolynn DEL [facility] Counselor from 05/17/2023 in [facility] ED from 02/15/2023 in [facility]  PHQ-2 Total Score 0 0 0 3 0  PHQ-9 Total Score -- 3 0 9 --   Flowsheet Row ED from 01/25/2024 in [facility] ED from 01/24/2024 in [facility] from 12/10/2023 in [facility] [facility] RISK CATEGORY No Risk No Risk No Risk     Musculoskeletal  Strength & Muscle Tone: within normal limits Gait & Station: normal Patient leans: N/A  Psychiatric Specialty Exam  Presentation  General Appearance:  Appropriate for Environment  Eye Contact: Good  Speech: Clear and Coherent  Speech Volume: Normal  Handedness: Right   Mood and Affect  Mood: Anxious  Affect: Congruent   Thought Process  Thought Processes: Coherent; Goal Directed  Descriptions of Associations:Intact  Orientation:Full (Time, Place and Person)  Thought Content:WDL  Diagnosis of Schizophrenia or Schizoaffective disorder in past: No    Hallucinations:denies Ideas of Reference: denies  Suicidal Thoughts:denies Homicidal Thoughts:denies  Sensorium  Memory: Immediate Good  Judgment: Intact  Insight: Present   Executive Functions  Concentration: Good  Attention Span: Good  Recall: Fair  Fund of Knowledge: Good  Language: Good   Psychomotor Activity  Psychomotor Activity:normal  Assets  Assets: Communication Skills; Desire for Improvement   Sleep  Sleep:No data recorded Estimated Sleeping Duration (Last 24 Hours): 8.75-11.25 hours  No data recorded  Physical Exam  Physical Exam ROS Blood pressure 118/84, pulse 63,  temperature 98.3 F (36.8 C), temperature source Oral, resp. rate 18, SpO2 98%. There is no height or weight on file to calculate BMI.  Treatment Plan Summary: Daily contact with patient to assess and evaluate symptoms and progress in treatment, Medication management, and Plan    Assessment: 7/30: reports only anxiety and chronic pain. Denies Si, hi or avh. Patient withdrawing form alcohol  on a librium  taper CIWA <5. Requesting trial of gabapentin  after discussing r/b/a.  7/31:reports only anxiety and chronic pain. Denies Si, hi or avh. Patient withdrawing form alcohol  on a librium  taper CIWA <5. Plan for discharge Saturday when completed with taper.   Principal Problem:  Alcohol  Use Disorder, Continue CIWA and Librium  Taper, currently stable - librium  taper -patient declines starting naltrexone  stating he was able to stay sober on his own  Active Problems: Type 2 Diabetes, controlled, AC/HS, SS glucose >250,  continue metformin   Hypertension, stable, continue Amlodipine  and Metoprolol  , goal <140/90 Hyperlipidemia, continue Zetia   GAD, mild, continue stable with Librium  and Hydroxyzine    - continue Librium  taper - increase gabapentin  to 200 mg TID for anxiety and for alcohol  withdrawal  DISPO: discharge home with IOP referral on Saturday    Berkley Wrightsman, MD 01/27/2024 4:07 PM

## 2024-01-27 NOTE — Group Note (Signed)
 Group Topic: Healthy Self Image and Positive Change  Group Date: 01/27/2024 Start Time: 0320 End Time: 0420 Facilitators: Auburn Stephane HERO, [place]  Department: [facility]  Number of Participants: 7  Group Focus: affirmation, clarity of thought, daily focus, and feeling awareness/expression Treatment Modality:  Psychoeducation Interventions utilized were exploration, mental fitness, patient education, reminiscence, and support Purpose: explore maladaptive thinking, express feelings, and improve communication skills  Name: [name] Date of Birth: 1960/08/12  MR: [id number]    Level of Participation: active Quality of Participation: attentive, cooperative, initiates communication, motivated, and offered feedback Interactions with others: gave feedback Mood/Affect: bright, brightens with interaction, and positive Triggers (if applicable): n/a Cognition: coherent/clear, concrete, insightful, and logical Progress: Significant Response: Gave very personal and insightful feedback and personal experiences Plan: referral / recommendations  Patients Problems:  Patient Active Problem List   Diagnosis Date Noted   Alcohol  use disorder, severe, dependence ([name]) 01/25/2024   Scrotal pain, right 08/26/2023   Epididymitis, right 08/26/2023   Complex renal cyst, right, 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/26/2023   Impingement syndrome of right shoulder 07/01/2023   Pain in right shoulder 06/16/2023   Anxiety disorder, unspecified 02/16/2023   Alcohol  use disorder 02/16/2023   Sleep disorder, unspecified 02/16/2023   Alcohol  abuse 02/15/2023   Alcohol  dependence ([name]) 05/16/2022   Low energy 03/06/2022   Moderate alcohol  dependence in early remission ([name]) 02/24/2022   Suicidal ideation 07/07/2021   Generalized anxiety disorder 07/07/2021   Other allergic rhinitis 09/09/2020   Adverse reaction to food, subsequent encounter 09/09/2020   History of bee sting allergy 09/09/2020    Alcohol  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) 03/19/2020   MDD (major depressive disorder), recurrent episode, severe ([name]) 03/18/2020   History of substance abuse ([name]) 01/04/2020   Severe 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 features ([name]) 12/10/2019   Tinea cruris 07/06/2019   Hemoglobin A1c less than 7.0% 07/06/2019   Muscle strain of wrist 04/05/2019   Pain in both wrists 04/05/2019   Anxiety 04/05/2019   Prediabetes 04/05/2019   MDD (major depressive disorder) 01/12/2019   Chest pain 10/25/2018   MDD (major depressive disorder), recurrent severe, without psychosis ([name]) 08/15/2018   Influenza A 07/17/2017   Lactic acidosis    Cough    Nausea and vomiting    Severe episode of recurrent major depressive disorder, without psychotic features ([name])    MDD (major depressive disorder), single episode, severe with psychotic features ([name]) 06/27/2017   MDD (major depressive disorder), severe ([name]) 06/23/2017   Essential hypertension 01/13/2016   Unstable angina ([name])    ED (erectile dysfunction) 02/15/2014   Atypical chest pain 12/14/2011   Allergy to contrast media (used for diagnostic x-rays) 05/12/2011   Dyslipidemia, goal LDL below 70 05/09/2011   DM2 (diabetes mellitus, type 2) ([name]) 07/10/2010   CAD (coronary artery disease) 07/10/2010

## 2024-01-27 NOTE — ED Notes (Signed)
 Patient is in the Dayroom calm and composed, watching TV with other patients. NAD.  Environment secured per policy. Will monitor for safety.

## 2024-01-27 NOTE — Group Note (Signed)
 Group Topic: Fears and Unhealthy Coping Skills  Group Date: 01/27/2024 Start Time: 1000 End Time: 1145 Facilitators: Lonzell Dwayne RAMAN, [place]  Department: [facility]  Number of Participants: 4  Group Focus: chemical dependency issues Treatment Modality:  Patient-Centered Therapy and Spiritual Interventions utilized were story telling Purpose: regain self-worth  Name: [name] Date of Birth: 04/10/1961  MR: [id number]    Level of Participation: Patient attended groupactive Quality of Participation: engaged Interactions with others: gave feedback Mood/Affect: positive Triggers (if applicable): N/A Cognition: coherent/clear Progress: Moderate Response: Appropriate  Plan: patient will be encouraged to work on being positive and his sobriety.  Patients Problems:  Patient Active Problem List   Diagnosis Date Noted   Alcohol  use disorder, severe, dependence ([name]) 01/25/2024   Scrotal pain, right 08/26/2023   Epididymitis, right 08/26/2023   Complex renal cyst, right, 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/26/2023   Impingement syndrome of right shoulder 07/01/2023   Pain in right shoulder 06/16/2023   Anxiety disorder, unspecified 02/16/2023   Alcohol  use disorder 02/16/2023   Sleep disorder, unspecified 02/16/2023   Alcohol  abuse 02/15/2023   Alcohol  dependence ([name]) 05/16/2022   Low energy 03/06/2022   Moderate alcohol  dependence in early remission ([name]) 02/24/2022   Suicidal ideation 07/07/2021   Generalized anxiety disorder 07/07/2021   Other allergic rhinitis 09/09/2020   Adverse reaction to food, subsequent encounter 09/09/2020   History of bee sting allergy 09/09/2020   Alcohol  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) 03/19/2020   MDD (major depressive disorder), recurrent episode, severe ([name]) 03/18/2020   History of substance abuse ([name]) 01/04/2020   Severe 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 features ([name]) 12/10/2019   Tinea cruris  07/06/2019   Hemoglobin A1c less than 7.0% 07/06/2019   Muscle strain of wrist 04/05/2019   Pain in both wrists 04/05/2019   Anxiety 04/05/2019   Prediabetes 04/05/2019   MDD (major depressive disorder) 01/12/2019   Chest pain 10/25/2018   MDD (major depressive disorder), recurrent severe, without psychosis ([name]) 08/15/2018   Influenza A 07/17/2017   Lactic acidosis    Cough    Nausea and vomiting    Severe episode of recurrent major depressive disorder, without psychotic features ([name])    MDD (major depressive disorder), single episode, severe with psychotic features ([name]) 06/27/2017   MDD (major depressive disorder), severe ([name]) 06/23/2017   Essential hypertension 01/13/2016   Unstable angina ([name])    ED (erectile dysfunction) 02/15/2014   Atypical chest pain 12/14/2011   Allergy to contrast media (used for diagnostic x-rays) 05/12/2011   Dyslipidemia, goal LDL below 70 05/09/2011   DM2 (diabetes mellitus, type 2) ([name]) 07/10/2010   CAD (coronary artery disease) 07/10/2010

## 2024-01-27 NOTE — ED Notes (Signed)
 Patient is sleeping. Respirations equal and unlabored, skin warm and dry, NAD. No change in assessment or acuity. Routine safety checks conducted according to facility protocol.

## 2024-01-28 DIAGNOSIS — F411 Generalized anxiety disorder: Secondary | ICD-10-CM | POA: Diagnosis not present

## 2024-01-28 DIAGNOSIS — I252 Old myocardial infarction: Secondary | ICD-10-CM | POA: Diagnosis not present

## 2024-01-28 DIAGNOSIS — I10 Essential (primary) hypertension: Secondary | ICD-10-CM | POA: Diagnosis not present

## 2024-01-28 DIAGNOSIS — Z7984 Long term (current) use of oral hypoglycemic drugs: Secondary | ICD-10-CM | POA: Diagnosis not present

## 2024-01-28 DIAGNOSIS — E1159 Type 2 diabetes mellitus with other circulatory complications: Secondary | ICD-10-CM | POA: Diagnosis not present

## 2024-01-28 DIAGNOSIS — F1020 Alcohol dependence, uncomplicated: Secondary | ICD-10-CM | POA: Diagnosis not present

## 2024-01-28 LAB — GLUCOSE, CAPILLARY
Glucose-Capillary: 224 mg/dL — ABNORMAL HIGH (ref 70–99)
Glucose-Capillary: 321 mg/dL — ABNORMAL HIGH (ref 70–99)
Glucose-Capillary: 331 mg/dL — ABNORMAL HIGH (ref 70–99)

## 2024-01-28 MED ORDER — INSULIN ASPART 100 UNIT/ML IJ SOLN
2.0000 [IU] | Freq: Once | INTRAMUSCULAR | Status: AC
  Administered 2024-01-28: 2 [IU] via SUBCUTANEOUS

## 2024-01-28 MED ORDER — GABAPENTIN 300 MG PO CAPS
300.0000 mg | ORAL_CAPSULE | Freq: Three times a day (TID) | ORAL | Status: DC
  Administered 2024-01-28 – 2024-01-29 (×3): 300 mg via ORAL
  Filled 2024-01-28 (×3): qty 1

## 2024-01-28 NOTE — ED Notes (Signed)
 CIWA assessment unable to complete at this time, pt is sleeping.

## 2024-01-28 NOTE — ED Notes (Signed)
 Pt pleasant and cooperative, engaged in activities and thoughts seems clear. Denies self harm thoughts.

## 2024-01-28 NOTE — ED Provider Notes (Signed)
 Behavioral Health Progress Note  Date and Time: 01/28/2024 3:25 PM Name: [name] MRN:  [id number]  Subjective:  Patinet tolerating Librium  taper well and CIWA of 1. Tolerating gabapentin  increase and would like to further increase as notes significant back pain. Reports improving mood and denies feeling depression. Notes feeling anxious. Denies any side effects from gabapentin . Reports fair sleep and appetite. Patient reports chronic back pain and would like to further increase gabapentin  and denies any side effects. Patient denies SI, HI or AVH. States he is supposed to get a laminectomy on 7/6 and he is future oriented on this as well as discharge tomorrow.  Diagnosis:  Final diagnoses:  Alcohol  use disorder, severe, dependence ([name])  GAD (generalized anxiety disorder)    Total Time spent with patient: 20 minutes  Past Psychiatric History: MDD, GAD, AUD  Past Medical History:  Past Medical History:  Diagnosis Date   Alcohol  dependence ([name])    Allergies    Arthritis    Chest pain    Chronic lower back pain    Chronic pain of right wrist    Cocaine abuse ([name])    per note on 01/18/23   Coronary artery disease    a. Multiple prior caths/PCI. Cath 2013 with possible spasm of RCA, 70% ISR of mid LCx with subsequent DES to mLCx and prox LCX. b. H/o microvascular angina. c. Recurrent angina 08/2014 - s/p PTCA/DES to prox Cx, PTCA/CBA to OM1.  c. LHC 06/10/15 with patent stents and some ISR in LCX and OM-1 that was not flow limiting --> Rx    Dyslipidemia    a. Intolerant to many statins except tolerating Livalo .   GERD (gastroesophageal reflux disease)    H/O cardiac catheterization 10/25/2018   Heart attack [facility])    Hypertension    Myocardial infarction [facility]) ~ 2010   Renal mass    S/P angioplasty with stent, DES, to proximal and mid LCX 12/15/11 12/15/2011   S/P foot surgery, right 04/2021   Shoulder pain    Stroke ([name])    pt. reports had a stroke around time of MI 2010    Type II diabetes mellitus ([name])    Unstable angina ([name])     Family History:  Family History  Problem Relation Age of Onset   Leukemia Mother    Prostate cancer Father    Cancer Brother    Coronary artery disease Paternal Grandmother    Cancer Paternal Grandfather    Migraines Neg Hx    Headache Neg Hx    Colon cancer Neg Hx    Esophageal cancer Neg Hx    Rectal cancer Neg Hx    Stomach cancer Neg Hx    Denies history of suicide attempts  Social History:  Socioeconomic History   Marital status: Widowed      Spouse name: Not on file   Number of children: 2   Years of education: GED   Highest education level: Some college, no degree  Occupational History   Not on file  Tobacco Use   Smoking status: Former      Current packs/day: 0.00      Average packs/day: 1 pack/day for 10.0 years (10.0 ttl pk-yrs)      Types: Cigarettes      Start date: 10/06/2008      Quit date: 10/07/2018      Years since quitting: 5.3   Smokeless tobacco: Never  Vaping Use   Vaping status: Never Used  Substance and  Sexual Activity   Alcohol  use: Not Currently      Comment: 05/28/2023-stopped drinking 22 days ago.   Drug use: Not Currently      Types: Cocaine   Sexual activity: Not Currently    Additional Social History:                         Sleep: Fair  Appetite:  Fair  Current Medications:  Current Facility-Administered Medications  Medication Dose Route Frequency Provider Last Rate Last Admin   acetaminophen  (TYLENOL ) tablet 650 mg  650 mg Oral Q6H PRN Arloa Suzen RAMAN, NP   650 mg at 01/27/24 1132   alum & mag hydroxide-simeth (MAALOX/MYLANTA) 200-200-20 MG/5ML suspension 30 mL  30 mL Oral Q4H PRN Arloa Suzen RAMAN, NP       amLODipine  (NORVASC ) tablet 10 mg  10 mg Oral Daily Arloa Suzen RAMAN, NP   10 mg at 01/28/24 1012   artificial tears ophthalmic solution 2 drop  2 drop Both Eyes Daily PRN Himani Corona, MD       aspirin  EC tablet 81 mg  81 mg Oral Daily Arloa Suzen RAMAN, NP   81 mg at 01/28/24 1012   chlordiazePOXIDE  (LIBRIUM ) capsule 25 mg  25 mg Oral Q6H PRN Arloa Suzen RAMAN, NP       chlordiazePOXIDE  (LIBRIUM ) capsule 25 mg  25 mg Oral Daily Arloa Suzen RAMAN, NP       clopidogrel  (PLAVIX ) tablet 75 mg  75 mg Oral Daily Arloa Suzen RAMAN, NP   75 mg at 01/28/24 1012   EPINEPHrine  (EPI-PEN) injection 0.3 mg  0.3 mg Intramuscular PRN Glema Takaki, MD       ezetimibe  (ZETIA ) tablet 10 mg  10 mg Oral Daily Arloa Suzen RAMAN, NP   10 mg at 01/28/24 1012   feeding supplement (GLUCERNA SHAKE) (GLUCERNA SHAKE) liquid 237 mL  237 mL Oral BID PRN Arloa Suzen RAMAN, NP   237 mL at 01/26/24 1002   gabapentin  (NEURONTIN ) capsule 300 mg  300 mg Oral TID Dryden Tapley, MD       hydrOXYzine  (ATARAX ) tablet 25 mg  25 mg Oral TID PRN Arloa Suzen RAMAN, NP   25 mg at 01/27/24 2144   insulin  aspart (novoLOG ) injection 0-4 Units  0-4 Units Subcutaneous TID WC Arloa Suzen RAMAN, NP   0 Units at 01/26/24 1201   loperamide  (IMODIUM ) capsule 2-4 mg  2-4 mg Oral PRN Arloa Suzen RAMAN, NP       magnesium  hydroxide (MILK OF MAGNESIA) suspension 30 mL  30 mL Oral Daily PRN Arloa Suzen RAMAN, NP   30 mL at 01/26/24 9061   metFORMIN  (GLUCOPHAGE ) tablet 500 mg  500 mg Oral BID WC Arloa Suzen RAMAN, NP   500 mg at 01/28/24 1012   metoprolol  tartrate (LOPRESSOR ) tablet 50 mg  50 mg Oral BID Arloa Suzen RAMAN, NP   50 mg at 01/28/24 1012   multivitamin with minerals tablet 1 tablet  1 tablet Oral Daily Arloa Suzen RAMAN, NP   1 tablet at 01/28/24 1012   nitroGLYCERIN  (NITROSTAT ) SL tablet 0.4 mg  0.4 mg Sublingual Q5 Min x 3 PRN Arloa Suzen RAMAN, NP       OLANZapine  (ZYPREXA ) injection 5 mg  5 mg Intramuscular TID PRN Arloa Suzen RAMAN, NP       OLANZapine  zydis (ZYPREXA ) disintegrating tablet 5 mg  5 mg Oral TID PRN Arloa Suzen RAMAN, NP  ondansetron  (ZOFRAN -ODT) disintegrating tablet 4 mg  4 mg Oral Q6H PRN Arloa Suzen RAMAN, NP       traZODone  (DESYREL )  tablet 50 mg  50 mg Oral QHS PRN Arloa Suzen RAMAN, NP   50 mg at 01/27/24 2145   Current Outpatient Medications  Medication Sig Dispense Refill   ACCU-CHEK GUIDE TEST test strip USE 1 STRIP TO CHECK GLUCOSE THREE TIMES DAILY AS DIRECTED 300 each 0   amLODipine  (NORVASC ) 10 MG tablet Take 1 tablet (10 mg total) by mouth daily. 90 tablet 0   aspirin  EC 81 MG tablet Take 81 mg by mouth daily.     busPIRone  (BUSPAR ) 10 MG tablet Take 1 tablet (10 mg total) by mouth 3 (three) times daily. (Patient not taking: Reported on 01/25/2024) 30 tablet 2   Cholecalciferol  25 MCG (1000 UT) tablet Take 1,000 Units by mouth daily.     clopidogrel  (PLAVIX ) 75 MG tablet Take 1 tablet by mouth once daily 90 tablet 0   cyclobenzaprine  (FLEXERIL ) 10 MG tablet Take 1 tablet (10 mg total) by mouth 3 (three) times daily as needed for muscle spasms. (Patient not taking: Reported on 01/25/2024) 30 tablet 0   EPINEPHrine  0.3 mg/0.3 mL IJ SOAJ injection Inject 0.3 mg into the muscle as needed for anaphylaxis. 1 each 2   ezetimibe  (ZETIA ) 10 MG tablet Take 1 tablet (10 mg total) by mouth daily. 90 tablet 0   fluticasone  (FLONASE ) 50 MCG/ACT nasal spray Place 2 sprays into both nostrils daily. (Patient not taking: Reported on 01/25/2024) 9.9 mL 2   gabapentin  (NEURONTIN ) 300 MG capsule Take 300 mg by mouth 2 (two) times daily. (Patient not taking: Reported on 01/25/2024)     LIVALO  4 MG TABS Take 1 tablet by mouth once daily (Patient taking differently: Take 4 mg by mouth daily.) 90 tablet 3   metFORMIN  (GLUCOPHAGE ) 500 MG tablet Take 500 mg by mouth 2 (two) times daily with a meal.     metoprolol  tartrate (LOPRESSOR ) 50 MG tablet Take 1 tablet (50 mg total) by mouth 2 (two) times daily. 180 tablet 3   Multiple Vitamin (MULTIVITAMIN WITH MINERALS) TABS tablet Take 1 tablet by mouth daily.     nitroGLYCERIN  (NITROSTAT ) 0.4 MG SL tablet Place 1 tablet (0.4 mg total) under the tongue every 5 (five) minutes x 3 doses as needed for  chest pain. (Patient not taking: Reported on 01/25/2024) 25 tablet 3   Polyethyl Glycol-Propyl Glycol (SYSTANE) 0.4-0.3 % GEL ophthalmic gel Place 1 Application into both eyes daily as needed (For dry eyes).      Labs  Lab Results:  Admission on 01/25/2024  Component Date Value Ref Range Status   Glucose-Capillary 01/26/2024 177 (H)  70 - 99 mg/dL Final   Glucose reference range applies only to samples taken after fasting for at least 8 hours.   Glucose-Capillary 01/26/2024 151 (H)  70 - 99 mg/dL Final   Glucose reference range applies only to samples taken after fasting for at least 8 hours.   Glucose-Capillary 01/26/2024 146 (H)  70 - 99 mg/dL Final   Glucose reference range applies only to samples taken after fasting for at least 8 hours.   Glucose-Capillary 01/27/2024 162 (H)  70 - 99 mg/dL Final   Glucose reference range applies only to samples taken after fasting for at least 8 hours.   Glucose-Capillary 01/27/2024 126 (H)  70 - 99 mg/dL Final   Glucose reference range applies only to samples taken after  fasting for at least 8 hours.   Glucose-Capillary 01/27/2024 214 (H)  70 - 99 mg/dL Final   Glucose reference range applies only to samples taken after fasting for at least 8 hours.   Glucose-Capillary 01/27/2024 204 (H)  70 - 99 mg/dL Final   Glucose reference range applies only to samples taken after fasting for at least 8 hours.   Glucose-Capillary 01/28/2024 224 (H)  70 - 99 mg/dL Final   Glucose reference range applies only to samples taken after fasting for at least 8 hours.  Admission on 01/24/2024, Discharged on 01/25/2024  Component Date Value Ref Range Status   WBC 01/24/2024 6.1  4.0 - 10.5 K/uL Final   RBC 01/24/2024 4.92  4.22 - 5.81 MIL/uL Final   Hemoglobin 01/24/2024 13.9  13.0 - 17.0 g/dL Final   HCT 92/71/7974 41.2  39.0 - 52.0 % Final   MCV 01/24/2024 83.7  80.0 - 100.0 fL Final   MCH 01/24/2024 28.3  26.0 - 34.0 pg Final   MCHC 01/24/2024 33.7  30.0 - 36.0  g/dL Final   RDW 92/71/7974 13.2  11.5 - 15.5 % Final   Platelets 01/24/2024 261  150 - 400 K/uL Final   nRBC 01/24/2024 0.0  0.0 - 0.2 % Final   Neutrophils Relative % 01/24/2024 44  % Final   Neutro Abs 01/24/2024 2.7  1.7 - 7.7 K/uL Final   Lymphocytes Relative 01/24/2024 40  % Final   Lymphs Abs 01/24/2024 2.4  0.7 - 4.0 K/uL Final   Monocytes Relative 01/24/2024 7  % Final   Monocytes Absolute 01/24/2024 0.4  0.1 - 1.0 K/uL Final   Eosinophils Relative 01/24/2024 8  % Final   Eosinophils Absolute 01/24/2024 0.5  0.0 - 0.5 K/uL Final   Basophils Relative 01/24/2024 1  % Final   Basophils Absolute 01/24/2024 0.1  0.0 - 0.1 K/uL Final   Immature Granulocytes 01/24/2024 0  % Final   Abs Immature Granulocytes 01/24/2024 0.02  0.00 - 0.07 K/uL Final   Performed at [facility], [address] [place], [place] [address]   Sodium 01/24/2024 136  135 - 145 mmol/L Final   Potassium 01/24/2024 4.1  3.5 - 5.1 mmol/L Final   Chloride 01/24/2024 104  98 - 111 mmol/L Final   CO2 01/24/2024 21 (L)  22 - 32 mmol/L Final   Glucose, Bld 01/24/2024 213 (H)  70 - 99 mg/dL Final   Glucose reference range applies only to samples taken after fasting for at least 8 hours.   BUN 01/24/2024 9  8 - 23 mg/dL Final   Creatinine, Ser 01/24/2024 0.83  0.61 - 1.24 mg/dL Final   Calcium  01/24/2024 9.3  8.9 - 10.3 mg/dL Final   Total Protein 92/71/7974 6.5  6.5 - 8.1 g/dL Final   Albumin 92/71/7974 4.1  3.5 - 5.0 g/dL Final   AST 92/71/7974 23  15 - 41 U/L Final   ALT 01/24/2024 36  0 - 44 U/L Final   Alkaline Phosphatase 01/24/2024 61  38 - 126 U/L Final   Total Bilirubin 01/24/2024 0.7  0.0 - 1.2 mg/dL Final   GFR, Estimated 01/24/2024 >60  >60 mL/min Final   Comment: (NOTE) Calculated using the CKD-EPI Creatinine Equation (2021)    Anion gap 01/24/2024 11  5 - 15 Final   Performed at [facility], [address], [place], [place] [address]   Alcohol , Ethyl (B) 01/24/2024 <15  <15 mg/dL Final  Comment: (NOTE) For medical purposes only. Performed at [facility], [address], [place], [place] [address]    POC Amphetamine UR 01/24/2024 None Detected  NONE DETECTED (Cut Off Level 1000 ng/mL) Final   POC Secobarbital (BAR) 01/24/2024 None Detected  NONE DETECTED (Cut Off Level 300 ng/mL) Final   POC Buprenorphine (BUP) 01/24/2024 None Detected  NONE DETECTED (Cut Off Level 10 ng/mL) Final   POC Oxazepam (BZO) 01/24/2024 None Detected  NONE DETECTED (Cut Off Level 300 ng/mL) Final   POC Cocaine UR 01/24/2024 None Detected  NONE DETECTED (Cut Off Level 300 ng/mL) Final   POC Methamphetamine UR 01/24/2024 None Detected  NONE DETECTED (Cut Off Level 1000 ng/mL) Final   POC Morphine  01/24/2024 None Detected  NONE DETECTED (Cut Off Level 300 ng/mL) Final   POC Methadone UR 01/24/2024 None Detected  NONE DETECTED (Cut Off Level 300 ng/mL) Final   POC Oxycodone  UR 01/24/2024 None Detected  NONE DETECTED (Cut Off Level 100 ng/mL) Final   POC Marijuana UR 01/24/2024 None Detected  NONE DETECTED (Cut Off Level 50 ng/mL) Final   Glucose-Capillary 01/24/2024 217 (H)  70 - 99 mg/dL Final   Glucose reference range applies only to samples taken after fasting for at least 8 hours.   Glucose-Capillary 01/25/2024 150 (H)  70 - 99 mg/dL Final   Glucose reference range applies only to samples taken after fasting for at least 8 hours.  Office Visit on 12/23/2023  Component Date Value Ref Range Status   Creatinine, Urine 12/23/2023 225.4  Not Estab. mg/dL Final   Microalbumin, Urine 12/23/2023 20.3  Not Estab. ug/mL Final   Microalb/Creat Ratio 12/23/2023 9  0 - 29 mg/g creat Final   Comment:                        Normal:                0 -  29                        Moderately increased: 30 - 300                        Severely increased:       >300    Hemoglobin A1C 12/23/2023 7.2 (A)  4.0 - 5.6 % Final   WBC 12/23/2023 5.4  3.4 - 10.8 x10E3/uL Final   RBC 12/23/2023 5.57  4.14 - 5.80  x10E6/uL Final   Hemoglobin 12/23/2023 15.4  13.0 - 17.7 g/dL Final   Hematocrit 93/73/7974 48.4  37.5 - 51.0 % Final   MCV 12/23/2023 87  79 - 97 fL Final   MCH 12/23/2023 27.6  26.6 - 33.0 pg Final   MCHC 12/23/2023 31.8  31.5 - 35.7 g/dL Final   RDW 93/73/7974 13.0  11.6 - 15.4 % Final   Platelets 12/23/2023 248  150 - 450 x10E3/uL Final   Glucose 12/23/2023 140 (H)  70 - 99 mg/dL Final   BUN 93/73/7974 20  8 - 27 mg/dL Final   Creatinine, Ser 12/23/2023 1.06  0.76 - 1.27 mg/dL Final   eGFR 93/73/7974 79  >59 mL/min/1.73 Final   BUN/Creatinine Ratio 12/23/2023 19  10 - 24 Final   Sodium 12/23/2023 137  134 - 144 mmol/L Final   Potassium 12/23/2023 4.7  3.5 - 5.2 mmol/L Final   Chloride 12/23/2023 101  96 - 106  mmol/L Final   CO2 12/23/2023 18 (L)  20 - 29 mmol/L Final   Calcium  12/23/2023 10.3 (H)  8.6 - 10.2 mg/dL Final   Total Protein 93/73/7974 7.2  6.0 - 8.5 g/dL Final   Albumin 93/73/7974 4.9  3.9 - 4.9 g/dL Final   Globulin, Total 12/23/2023 2.3  1.5 - 4.5 g/dL Final   Bilirubin Total 12/23/2023 0.7  0.0 - 1.2 mg/dL Final   Alkaline Phosphatase 12/23/2023 84  44 - 121 IU/L Final   AST 12/23/2023 50 (H)  0 - 40 IU/L Final   ALT 12/23/2023 61 (H)  0 - 44 IU/L Final   Cholesterol, Total 12/23/2023 156  100 - 199 mg/dL Final   Triglycerides 93/73/7974 109  0 - 149 mg/dL Final   HDL 93/73/7974 40  >39 mg/dL Final   VLDL Cholesterol Cal 12/23/2023 20  5 - 40 mg/dL Final   LDL Chol Calc (NIH) 12/23/2023 96  0 - 99 mg/dL Final   Chol/HDL Ratio 12/23/2023 3.9  0.0 - 5.0 ratio Final   Comment:                                   T. Chol/HDL Ratio                                             Men  Women                               1/2 Avg.Risk  3.4    3.3                                   Avg.Risk  5.0    4.4                                2X Avg.Risk  9.6    7.1                                3X Avg.Risk 23.4   11.0    Prostate Specific Ag, Serum 12/23/2023 1.0  0.0 - 4.0 ng/mL  Final   Comment: Roche ECLIA methodology. According to the American Urological Association, Serum PSA should decrease and remain at undetectable levels after radical prostatectomy. The AUA defines biochemical recurrence as an initial PSA value 0.2 ng/mL or greater followed by a subsequent confirmatory PSA value 0.2 ng/mL or greater. Values obtained with different assay methods or kits cannot be used interchangeably. Results cannot be interpreted as absolute evidence of the presence or absence of malignant disease.   Office Visit on 09/23/2023  Component Date Value Ref Range Status   Specific Gravity, UA 09/23/2023 1.025  1.005 - 1.030 Final   pH, UA 09/23/2023 5.5  5.0 - 7.5 Final   Color, UA 09/23/2023 Yellow  Yellow Final   Appearance Ur 09/23/2023 Clear  Clear Final   Leukocytes,UA 09/23/2023 Negative  Negative Final   Protein,UA 09/23/2023 1+ (A)  Negative/Trace Final   Glucose, UA 09/23/2023 Negative  Negative Final   Ketones, UA 09/23/2023 Negative  Negative Final   RBC, UA 09/23/2023 Negative  Negative Final   Bilirubin, UA 09/23/2023 Negative  Negative Final   Urobilinogen, Ur 09/23/2023 0.2  0.2 - 1.0 mg/dL Final   Nitrite, UA 96/72/7974 Negative  Negative Final   Microscopic Examination 09/23/2023 See below:   Final   WBC, UA 09/23/2023 0-5  0 - 5 /hpf Final   RBC, Urine 09/23/2023 0-2  0 - 2 /hpf Final   Epithelial Cells (non renal) 09/23/2023 0-10  0 - 10 /hpf Final   Casts 09/23/2023 Present  None seen /lpf Final   Cast Type 09/23/2023 Hyaline casts  N/A Final   Bacteria, UA 09/23/2023 Few  None seen/Few Final  Office Visit on 08/27/2023  Component Date Value Ref Range Status   Hemoglobin A1C 08/27/2023 7.1 (A)  4.0 - 5.6 % Final  Office Visit on 08/26/2023  Component Date Value Ref Range Status   Specific Gravity, UA 08/26/2023 1.025  1.005 - 1.030 Final   pH, UA 08/26/2023 5.5  5.0 - 7.5 Final   Color, UA 08/26/2023 Orange  Yellow Final   Appearance Ur  08/26/2023 Clear  Clear Final   Leukocytes,UA 08/26/2023 Negative  Negative Final   Protein,UA 08/26/2023 1+ (A)  Negative/Trace Final   Glucose, UA 08/26/2023 Negative  Negative Final   Ketones, UA 08/26/2023 Trace (A)  Negative Final   RBC, UA 08/26/2023 Negative  Negative Final   Bilirubin, UA 08/26/2023 Negative  Negative Final   Urobilinogen, Ur 08/26/2023 1.0  0.2 - 1.0 mg/dL Final   Nitrite, UA 97/72/7974 Negative  Negative Final   Microscopic Examination 08/26/2023 See below:   Final   WBC, UA 08/26/2023 0-5  0 - 5 /hpf Final   RBC, Urine 08/26/2023 0-2  0 - 2 /hpf Final   Epithelial Cells (non renal) 08/26/2023 0-10  0 - 10 /hpf Final   Casts 08/26/2023 Present  None seen /lpf Final   Cast Type 08/26/2023 Hyaline casts  N/A Final   Mucus, UA 08/26/2023 Present (A)  Not Estab. Final   Bacteria, UA 08/26/2023 Few  None seen/Few Final  Admission on 08/13/2023, Discharged on 08/13/2023  Component Date Value Ref Range Status   Sodium 08/13/2023 137  135 - 145 mmol/L Final   Potassium 08/13/2023 4.5  3.5 - 5.1 mmol/L Final   Chloride 08/13/2023 107  98 - 111 mmol/L Final   CO2 08/13/2023 22  22 - 32 mmol/L Final   Glucose, Bld 08/13/2023 137 (H)  70 - 99 mg/dL Final   Glucose reference range applies only to samples taken after fasting for at least 8 hours.   BUN 08/13/2023 13  8 - 23 mg/dL Final   Creatinine, Ser 08/13/2023 1.02  0.61 - 1.24 mg/dL Final   Calcium  08/13/2023 9.3  8.9 - 10.3 mg/dL Final   Total Protein 97/85/7974 7.0  6.5 - 8.1 g/dL Final   Albumin 97/85/7974 4.3  3.5 - 5.0 g/dL Final   AST 97/85/7974 23  15 - 41 U/L Final   ALT 08/13/2023 32  0 - 44 U/L Final   Alkaline Phosphatase 08/13/2023 46  38 - 126 U/L Final   Total Bilirubin 08/13/2023 0.6  0.0 - 1.2 mg/dL Final   GFR, Estimated 08/13/2023 >60  >60 mL/min Final   Comment: (NOTE) Calculated using the CKD-EPI Creatinine Equation (2021)    Anion gap 08/13/2023 8  5 - 15 Final   Performed at [facility], [address], [place],   [address]   WBC 08/13/2023 5.8  4.0 - 10.5 K/uL Final   RBC 08/13/2023 5.35  4.22 - 5.81 MIL/uL Final   Hemoglobin 08/13/2023 14.8  13.0 - 17.0 g/dL Final   HCT 97/85/7974 45.0  39.0 - 52.0 % Final   MCV 08/13/2023 84.1  80.0 - 100.0 fL Final   MCH 08/13/2023 27.7  26.0 - 34.0 pg Final   MCHC 08/13/2023 32.9  30.0 - 36.0 g/dL Final   RDW 97/85/7974 12.8  11.5 - 15.5 % Final   Platelets 08/13/2023 219  150 - 400 K/uL Final   nRBC 08/13/2023 0.0  0.0 - 0.2 % Final   Performed at [facility], [address], [place], [place] [address]   Color, Urine 08/13/2023 AMBER (A)  YELLOW Final   BIOCHEMICALS MAY BE AFFECTED BY COLOR   APPearance 08/13/2023 HAZY (A)  CLEAR Final   Specific Gravity, Urine 08/13/2023 1.027  1.005 - 1.030 Final   pH 08/13/2023 5.0  5.0 - 8.0 Final   Glucose, UA 08/13/2023 NEGATIVE  NEGATIVE mg/dL Final   Hgb urine dipstick 08/13/2023 NEGATIVE  NEGATIVE Final   Bilirubin Urine 08/13/2023 NEGATIVE  NEGATIVE Final   Ketones, ur 08/13/2023 NEGATIVE  NEGATIVE mg/dL Final   Protein, ur 97/85/7974 30 (A)  NEGATIVE mg/dL Final   Nitrite 97/85/7974 NEGATIVE  NEGATIVE Final   Leukocytes,Ua 08/13/2023 NEGATIVE  NEGATIVE Final   RBC / HPF 08/13/2023 0-5  0 - 5 RBC/hpf Final   WBC, UA 08/13/2023 0-5  0 - 5 WBC/hpf Final   Bacteria, UA 08/13/2023 NONE SEEN  NONE SEEN Final   Squamous Epithelial / HPF 08/13/2023 0-5  0 - 5 /HPF Final   Mucus 08/13/2023 PRESENT   Final   Hyaline Casts, UA 08/13/2023 PRESENT   Final   Ca Oxalate Crys, UA 08/13/2023 PRESENT   Final   Performed at [facility], [address] [place], [place] [address]   Neisseria Gonorrhea 08/13/2023 Negative   Final   Chlamydia 08/13/2023 Negative   Final   Comment 08/13/2023 Normal Reference Ranger Chlamydia - Negative   Final   Comment 08/13/2023 Normal Reference Range Neisseria Gonorrhea - Negative   Final    Blood Alcohol  level:  Lab Results   Component Value Date   [facility] <15 01/24/2024   ETH <10 02/15/2023    Metabolic Disorder Labs: Lab Results  Component Value Date   HGBA1C 7.2 (A) 12/23/2023   MPG 159.94 02/15/2023   MPG 142.72 08/07/2022   Lab Results  Component Value Date   PROLACTIN 13.0 03/17/2020   Lab Results  Component Value Date   CHOL 156 12/23/2023   TRIG 109 12/23/2023   HDL 40 12/23/2023   CHOLHDL 3.9 12/23/2023   VLDL 44 (H) 02/15/2023   LDLCALC 96 12/23/2023   LDLCALC 67 02/15/2023    Therapeutic Lab Levels: No results found for: LITHIUM No results found for: VALPROATE No results found for: CBMZ  Physical Findings   AIMS    Flowsheet Row Admission (Discharged) [facility] Ad[facility] (Discharged) from [facility] Admission (Discharged) from OP [facility] Admission (Discharged) from OP Visit [facility] Admission (Discharged) [facility]  AIMS Total Score 0 0 0 0 0   AUDIT  Flowsheet Row Office Visit from 11/16/2022 in [facility] Office Visit from 08/27/2020 in [facility] Admission (Discharged) from OP [facility] Admission (Discharged) from [facility] Admission (Discharged) from [facility]  Alcohol  Use Disorder Identification Test Final Score (AUDIT) 24 27 28 13 25    GAD-7    Flowsheet Row Office Visit from 08/27/2023 in [facility] Counselor from 05/17/2023 in [facility] Office Visit from 02/15/2023 in [facility] Clinical Support from 02/24/2022 in [facility] Office Visit from 01/08/2022 in [facility]  Total GAD-7 Score 0 12 10 2 12    [id number]    Flowsheet Row ED from 01/24/2024 in [facility] Office Visit from 12/23/2023 in [facility] - A Dept Of [facility] Office Visit from 08/27/2023 in [facility] - A Dept Of [facility] Counselor from 05/17/2023 in [facility] ED from 02/15/2023 in [place] [facility]  PHQ-2 Total Score 0 0 0 3 0  PHQ-9 Total Score -- 3 0 9 --   Flowsheet Row ED from 01/25/2024 in [facility] ED from 01/24/2024 in [facility] UC from 12/10/2023 in [facility] RISK CATEGORY No Risk No Risk No Risk     Musculoskeletal  Strength & Muscle Tone: within normal limits Gait & Station: normal Patient leans: N/A  Psychiatric Specialty Exam  Presentation  General Appearance:  Appropriate for Environment  Eye Contact: Good  Speech: Clear and Coherent  Speech Volume: Normal  Handedness: Right   Mood and Affect  Mood: Anxious  Affect: Congruent   Thought Process  Thought Processes: Coherent; Goal Directed  Descriptions of Associations:Intact  Orientation:Full (Time, Place and Person)  Thought Content:WDL  Diagnosis of Schizophrenia or Schizoaffective disorder in past: No    Hallucinations:denies Ideas of Reference: denies  Suicidal Thoughts:denies Homicidal Thoughts:denies  Sensorium  Memory: Immediate Good  Judgment: Intact  Insight: Present   Executive Functions  Concentration: Good  Attention  Span: Good  Recall: Fair  Fund of Knowledge: Good  Language: Good   Psychomotor Activity  Psychomotor Activity:normal  Assets  Assets: Communication Skills; Desire for Improvement   Sleep  Sleep:No data recorded Estimated Sleeping Duration (Last 24 Hours): 10.50-11.50 hours  No data recorded  Physical Exam  Physical Exam ROS Blood pressure 122/85, pulse 74, temperature 98 F (36.7 C), temperature source Oral, resp. rate 19, SpO2 97%. There is no height or weight on file to calculate BMI.  Treatment Plan Summary: Daily contact with patient to assess and evaluate symptoms and progress in treatment, Medication management, and Plan    Assessment: 7/30: reports only anxiety and chronic pain. Denies Si, hi or avh. Patient withdrawing form alcohol  on a librium  taper CIWA <5. Requesting trial of gabapentin  after discussing r/b/a.  7/31:reports only anxiety and chronic pain. Denies Si, hi or avh. Patient withdrawing form alcohol  on a librium  taper CIWA <5. Plan  for discharge Saturday when completed with taper.   8/1:  reports only anxiety and chronic pain. Denies Si, hi or avh. Patient withdrawing form alcohol  on a librium  taper CIWA <5. Plan for discharge tomorrow with IOP referral   Principal Problem:  Alcohol  Use Disorder, Continue CIWA and Librium  Taper, currently stable - librium  taper -patient declines starting naltrexone  stating he was able to stay sober on his own  Active Problems: Type 2 Diabetes, controlled, AC/HS, SS glucose >250, continue metformin   Hypertension, stable, continue Amlodipine  and Metoprolol  , goal <140/90 Hyperlipidemia, continue Zetia   GAD, mild, continue stable with Librium  and Hydroxyzine    - continue Librium  taper which finishes tomorrow, CIWA of 0 - increase gabapentin  to 300 mg TID for anxiety and for alcohol  withdrawal  DISPO: discharge home with IOP referral on Saturday    Georgian Mcclory, MD 01/28/2024 3:25 PM

## 2024-01-28 NOTE — Clinical Note (Incomplete)
 Patient is scheduled to DC back home tomorrow with cab voucher provided and in patients chart and plans to leave at 8:30AM He has follow up instructions in AVS.

## 2024-01-28 NOTE — ED Notes (Signed)
 Pt pleasant and cooperative, engaged in activities and thoughts seems clear. Denies self harm thoughts. Pt anxious to be discharged tomorrow

## 2024-01-28 NOTE — Group Note (Signed)
 Group Topic: Understanding Self  Group Date: 01/28/2024 Start Time: 1930 End Time: 2000 Facilitators: Anice Benton LABOR, NT  Department: [facility]  Number of Participants: 10  Group Focus: clarity of thought Treatment Modality:  Cognitive Behavioral Therapy Interventions utilized were assignment (Thankful Worksheet) Purpose: explore maladaptive thinking  Name: [name] Date of Birth: 08/11/1960  MR: [id number]    Level of Participation: active Quality of Participation: attentive Interactions with others: gave feedback Mood/Affect: appropriate Triggers (if applicable): None Cognition: coherent/clear Progress: Moderate Response: Good Plan: follow-up needed  Patients Problems:  Patient Active Problem List   Diagnosis Date Noted   Alcohol  use disorder, severe, dependence ([name]) 01/25/2024   Scrotal pain, right 08/26/2023   Epididymitis, right 08/26/2023   Complex renal cyst, right, 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/26/2023   Impingement syndrome of right shoulder 07/01/2023   Pain in right shoulder 06/16/2023   Anxiety disorder, unspecified 02/16/2023   Alcohol  use disorder 02/16/2023   Sleep disorder, unspecified 02/16/2023   Alcohol  abuse 02/15/2023   Alcohol  dependence ([name]) 05/16/2022   Low energy 03/06/2022   Moderate alcohol  dependence in early remission ([name]) 02/24/2022   Suicidal ideation 07/07/2021   Generalized anxiety disorder 07/07/2021   Other allergic rhinitis 09/09/2020   Adverse reaction to food, subsequent encounter 09/09/2020   History of bee sting allergy 09/09/2020   Alcohol  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) 03/19/2020   MDD (major depressive disorder), recurrent episode, severe ([name]) 03/18/2020   History of substance abuse ([name]) 01/04/2020   Severe 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 features ([name]) 12/10/2019   Tinea cruris 07/06/2019   Hemoglobin A1c less than 7.0% 07/06/2019   Muscle strain of wrist  04/05/2019   Pain in both wrists 04/05/2019   Anxiety 04/05/2019   Prediabetes 04/05/2019   MDD (major depressive disorder) 01/12/2019   Chest pain 10/25/2018   MDD (major depressive disorder), recurrent severe, without psychosis ([name]) 08/15/2018   Influenza A 07/17/2017   Lactic acidosis    Cough    Nausea and vomiting    Severe episode of recurrent major depressive disorder, without psychotic features ([name])    MDD (major depressive disorder), single episode, severe with psychotic features ([name]) 06/27/2017   MDD (major depressive disorder), severe ([name]) 06/23/2017   Essential hypertension 01/13/2016   Unstable angina ([name])    ED (erectile dysfunction) 02/15/2014   Atypical chest pain 12/14/2011   Allergy to contrast media (used for diagnostic x-rays) 05/12/2011   Dyslipidemia, goal LDL below 70 05/09/2011   DM2 (diabetes mellitus, type 2) ([name]) 07/10/2010   CAD (coronary artery disease) 07/10/2010

## 2024-01-28 NOTE — ED Notes (Signed)
 Patient asleep, NAD. will monitor for safety.

## 2024-01-28 NOTE — ED Notes (Signed)
 Assumed care of patient this am, patient was pleasant and cooperative upon approach c/o not having adequate food for his diabetes but, denied having any other issues. Pt assured that nurse would address concern. Pt also denied self harm thoughts, plan or intentions. There was no overt discomfort noted.

## 2024-01-29 DIAGNOSIS — I10 Essential (primary) hypertension: Secondary | ICD-10-CM | POA: Diagnosis not present

## 2024-01-29 DIAGNOSIS — E1159 Type 2 diabetes mellitus with other circulatory complications: Secondary | ICD-10-CM | POA: Diagnosis not present

## 2024-01-29 DIAGNOSIS — Z7984 Long term (current) use of oral hypoglycemic drugs: Secondary | ICD-10-CM | POA: Diagnosis not present

## 2024-01-29 DIAGNOSIS — F411 Generalized anxiety disorder: Secondary | ICD-10-CM | POA: Diagnosis not present

## 2024-01-29 DIAGNOSIS — I252 Old myocardial infarction: Secondary | ICD-10-CM | POA: Diagnosis not present

## 2024-01-29 DIAGNOSIS — F1020 Alcohol dependence, uncomplicated: Secondary | ICD-10-CM | POA: Diagnosis not present

## 2024-01-29 LAB — GLUCOSE, CAPILLARY: Glucose-Capillary: 217 mg/dL — ABNORMAL HIGH (ref 70–99)

## 2024-01-29 MED ORDER — GABAPENTIN 300 MG PO CAPS: 300.0000 mg | ORAL_CAPSULE | Freq: Three times a day (TID) | ORAL | 0 refills | Status: AC

## 2024-01-29 NOTE — ED Provider Notes (Signed)
 FBC/OBS ASAP Discharge Summary  Date and Time: 01/29/2024 9:08 AM  Name: [name]  MRN:  [id number]   Discharge Diagnoses:  Final diagnoses:  Alcohol  use disorder, severe, dependence ([name])  GAD (generalized anxiety disorder)    Subjective: Patient completed librium  taper. CIWA of 0 and denies alcohol  cravings. Declines naltrexone . Patient denies depression or anxiety and notes benefit from gabapentin  in pain and anxiety. Denies SI, HI or AVH. Denies feeling hopeless.   Reports improving mood. Denies any side effects from gabapentin . Reports fair sleep and appetite. States he is supposed to get a laminectomy on 7/6 and he is future oriented on discharge today with IOP referral.   Stay Summary:   Patient was admitted to inpatient psychiatry at [facility] for safety and stabilization. Patient was provided safe and therapeutic milieu, psychiatric and medical assessment, care and treatment, as well as support from nursing, behavioral health staff. Both psychotherapy and psychoeducation groups were provided. Different coping skills such as journaling, CBT and art therapy groups were offered. Additional consultation was provided by hospitalist for H&P and medical needs.  Patient was started on Librium  taper and CIWA protocol during the admission for alcohol  withdrawal. Patient declined FDA approved medications for alcohol  cessation such as naltrexone  stating he was able to stay sober several years without medications. Denied depression during admission. Denied SI, HI or AVH. Primary complains of anxiety and chronic pain and gabapentin  was started and titrated to 300 mg TID with good effect. Patient denied side effects.. Patient tolerated without side effects and medications were titrated to therapeutic effect. As patient stabilized on medications and participated in therapeutic interventions, symptoms began to improve.  On the day of discharge, the chart was reviewed, case was discussed with  staff and the patient was seen in person. Patient's overall mood has improved. Patient was calm and cooperative and did not appear anxious. Patient reported adequate sleep and stable mood. Patient was tolerating medications well without side effects reported or noted. Patient denied suicidal ideation, plan or intent, denied hopelessness, helplessness or worthlessness, and denied homicidal ideation. Insight and judgement have improved. Patient demonstrated future orientation and was motivated to follow-up with aftercare. Patient was encouraged to be adherent with medications. Patient was instructed to call 911, ask for help to go to the closest emergency room or crisis center, call crisis hotlines for help if in critical status or when symptoms were worsening. Patient voiced understanding of this information. At the time of discharge, patient had reached maximum benefit from hospitalization, was no longer considered to be dangerous to self or others, and was psychiatrically stable and otherwise appropriate for discharge to less restrictive care in the community.   Medical Hospital Course: Patient was seen by the hospitalist for routine admission examination. Medications for chronic conditions were continued.  Medical hospital course was otherwise unremarkable.    Total Time spent with patient: 30 minutes  Past Psychiatric History: MDD, GAD, AUD  Denies prior history of suicide attempts Past Medical History:  Past Medical History:  Diagnosis Date   Alcohol  dependence ([name])    Allergies    Arthritis    Chest pain    Chronic lower back pain    Chronic pain of right wrist    Cocaine abuse ([name])    per note on 01/18/23   Coronary artery disease    a. Multiple prior caths/PCI. Cath 2013 with possible spasm of RCA, 70% ISR of mid LCx with subsequent DES to mLCx and prox LCX.  b. H/o microvascular angina. c. Recurrent angina 08/2014 - s/p PTCA/DES to prox Cx, PTCA/CBA to OM1.  c. LHC 06/10/15 with patent  stents and some ISR in LCX and OM-1 that was not flow limiting --> Rx    Dyslipidemia    a. Intolerant to many statins except tolerating Livalo .   GERD (gastroesophageal reflux disease)    H/O cardiac catheterization 10/25/2018   Heart attack [facility])    Hypertension    Myocardial infarction [facility]) ~ 2010   Renal mass    S/P angioplasty with stent, DES, to proximal and mid LCX 12/15/11 12/15/2011   S/P foot surgery, right 04/2021   Shoulder pain    Stroke [facility])    pt. reports had a stroke around time of MI 2010   Type II diabetes mellitus ([name])    Unstable angina ([name])      Family Psychiatric History:   Denies history of suicide attempts   Social History:       Socioeconomic History   Marital status: Widowed      Spouse name: Not on file   Number of children: 2   Years of education: GED   Highest education level: Some college, no degree  Occupational History   Not on file  Tobacco Use   Smoking status: Former      Current packs/day: 0.00      Average packs/day: 1 pack/day for 10.0 years (10.0 ttl pk-yrs)      Types: Cigarettes      Start date: 10/06/2008      Quit date: 10/07/2018      Years since quitting: 5.3   Smokeless tobacco: Never  Vaping Use   Vaping status: Never Used  Substance and Sexual Activity   Alcohol  use: Not Currently      Comment: 05/28/2023-stopped drinking 22 days ago.   Drug use: Not Currently      Types: Cocaine   Sexual activity: Not Currently    Tobacco Cessation:  A prescription for an FDA-approved tobacco cessation medication was offered at discharge and the patient refused  Current Medications:  Current Facility-Administered Medications  Medication Dose Route Frequency Provider Last Rate Last Admin   acetaminophen  (TYLENOL ) tablet 650 mg  650 mg Oral Q6H PRN Arloa Suzen RAMAN, NP   650 mg at 01/27/24 1132   alum & mag hydroxide-simeth (MAALOX/MYLANTA) 200-200-20 MG/5ML suspension 30 mL  30 mL Oral Q4H PRN Arloa Suzen RAMAN, NP        amLODipine  (NORVASC ) tablet 10 mg  10 mg Oral Daily Arloa Suzen RAMAN, NP   10 mg at 01/28/24 1012   artificial tears ophthalmic solution 2 drop  2 drop Both Eyes Daily PRN Kyrollos Cordell, MD       aspirin  EC tablet 81 mg  81 mg Oral Daily Arloa Suzen RAMAN, NP   81 mg at 01/28/24 1012   clopidogrel  (PLAVIX ) tablet 75 mg  75 mg Oral Daily Arloa Suzen RAMAN, NP   75 mg at 01/28/24 1012   EPINEPHrine  (EPI-PEN) injection 0.3 mg  0.3 mg Intramuscular PRN Sufian Ravi, MD       ezetimibe  (ZETIA ) tablet 10 mg  10 mg Oral Daily Arloa Suzen RAMAN, NP   10 mg at 01/28/24 1012   feeding supplement (GLUCERNA SHAKE) (GLUCERNA SHAKE) liquid 237 mL  237 mL Oral BID PRN Arloa Suzen RAMAN, NP   237 mL at 01/26/24 1002   gabapentin  (NEURONTIN ) capsule 300 mg  300 mg Oral TID Pershing Skidmore, MD  300 mg at 01/28/24 2106   hydrOXYzine  (ATARAX ) tablet 25 mg  25 mg Oral TID PRN Arloa Suzen RAMAN, NP   25 mg at 01/27/24 2144   insulin  aspart (novoLOG ) injection 0-4 Units  0-4 Units Subcutaneous TID WC Arloa Suzen RAMAN, NP   0 Units at 01/26/24 1201   magnesium  hydroxide (MILK OF MAGNESIA) suspension 30 mL  30 mL Oral Daily PRN Arloa Suzen RAMAN, NP   30 mL at 01/26/24 9061   metFORMIN  (GLUCOPHAGE ) tablet 500 mg  500 mg Oral BID WC Arloa Suzen RAMAN, NP   500 mg at 01/28/24 1627   metoprolol  tartrate (LOPRESSOR ) tablet 50 mg  50 mg Oral BID Arloa Suzen RAMAN, NP   50 mg at 01/28/24 2105   multivitamin with minerals tablet 1 tablet  1 tablet Oral Daily Arloa Suzen RAMAN, NP   1 tablet at 01/28/24 1012   nitroGLYCERIN  (NITROSTAT ) SL tablet 0.4 mg  0.4 mg Sublingual Q5 Min x 3 PRN Arloa Suzen RAMAN, NP       OLANZapine  (ZYPREXA ) injection 5 mg  5 mg Intramuscular TID PRN Arloa Suzen RAMAN, NP       OLANZapine  zydis (ZYPREXA ) disintegrating tablet 5 mg  5 mg Oral TID PRN Arloa Suzen RAMAN, NP       traZODone  (DESYREL ) tablet 50 mg  50 mg Oral QHS PRN Arloa Suzen RAMAN, NP   50 mg at 01/27/24 2145   Current  Outpatient Medications  Medication Sig Dispense Refill   ACCU-CHEK GUIDE TEST test strip USE 1 STRIP TO CHECK GLUCOSE THREE TIMES DAILY AS DIRECTED 300 each 0   amLODipine  (NORVASC ) 10 MG tablet Take 1 tablet (10 mg total) by mouth daily. 90 tablet 0   aspirin  EC 81 MG tablet Take 81 mg by mouth daily.     busPIRone  (BUSPAR ) 10 MG tablet Take 1 tablet (10 mg total) by mouth 3 (three) times daily. (Patient not taking: Reported on 01/25/2024) 30 tablet 2   Cholecalciferol  25 MCG (1000 UT) tablet Take 1,000 Units by mouth daily.     clopidogrel  (PLAVIX ) 75 MG tablet Take 1 tablet by mouth once daily 90 tablet 0   cyclobenzaprine  (FLEXERIL ) 10 MG tablet Take 1 tablet (10 mg total) by mouth 3 (three) times daily as needed for muscle spasms. (Patient not taking: Reported on 01/25/2024) 30 tablet 0   EPINEPHrine  0.3 mg/0.3 mL IJ SOAJ injection Inject 0.3 mg into the muscle as needed for anaphylaxis. 1 each 2   ezetimibe  (ZETIA ) 10 MG tablet Take 1 tablet (10 mg total) by mouth daily. 90 tablet 0   fluticasone  (FLONASE ) 50 MCG/ACT nasal spray Place 2 sprays into both nostrils daily. (Patient not taking: Reported on 01/25/2024) 9.9 mL 2   gabapentin  (NEURONTIN ) 300 MG capsule Take 1 capsule (300 mg total) by mouth 3 (three) times daily. 90 capsule 0   LIVALO  4 MG TABS Take 1 tablet by mouth once daily (Patient taking differently: Take 4 mg by mouth daily.) 90 tablet 3   metFORMIN  (GLUCOPHAGE ) 500 MG tablet Take 500 mg by mouth 2 (two) times daily with a meal.     metoprolol  tartrate (LOPRESSOR ) 50 MG tablet Take 1 tablet (50 mg total) by mouth 2 (two) times daily. 180 tablet 3   Multiple Vitamin (MULTIVITAMIN WITH MINERALS) TABS tablet Take 1 tablet by mouth daily.     nitroGLYCERIN  (NITROSTAT ) 0.4 MG SL tablet Place 1 tablet (0.4 mg total) under the tongue every 5 (five) minutes  x 3 doses as needed for chest pain. (Patient not taking: Reported on 01/25/2024) 25 tablet 3   Polyethyl Glycol-Propyl Glycol  (SYSTANE) 0.4-0.3 % GEL ophthalmic gel Place 1 Application into both eyes daily as needed (For dry eyes).      PTA Medications:  Facility Ordered Medications  Medication   acetaminophen  (TYLENOL ) tablet 650 mg   alum & mag hydroxide-simeth (MAALOX/MYLANTA) 200-200-20 MG/5ML suspension 30 mL   magnesium  hydroxide (MILK OF MAGNESIA) suspension 30 mL   OLANZapine  zydis (ZYPREXA ) disintegrating tablet 5 mg   OLANZapine  (ZYPREXA ) injection 5 mg   hydrOXYzine  (ATARAX ) tablet 25 mg   traZODone  (DESYREL ) tablet 50 mg   aspirin  EC tablet 81 mg   [EXPIRED] ondansetron  (ZOFRAN -ODT) disintegrating tablet 4 mg   amLODipine  (NORVASC ) tablet 10 mg   clopidogrel  (PLAVIX ) tablet 75 mg   ezetimibe  (ZETIA ) tablet 10 mg   metFORMIN  (GLUCOPHAGE ) tablet 500 mg   metoprolol  tartrate (LOPRESSOR ) tablet 50 mg   nitroGLYCERIN  (NITROSTAT ) SL tablet 0.4 mg   insulin  aspart (novoLOG ) injection 0-4 Units   multivitamin with minerals tablet 1 tablet   [EXPIRED] chlordiazePOXIDE  (LIBRIUM ) capsule 25 mg   [EXPIRED] loperamide  (IMODIUM ) capsule 2-4 mg   [EXPIRED] ondansetron  (ZOFRAN -ODT) disintegrating tablet 4 mg   [COMPLETED] chlordiazePOXIDE  (LIBRIUM ) capsule 25 mg   Followed by   [COMPLETED] chlordiazePOXIDE  (LIBRIUM ) capsule 25 mg   Followed by   [COMPLETED] chlordiazePOXIDE  (LIBRIUM ) capsule 25 mg   Followed by   [COMPLETED] chlordiazePOXIDE  (LIBRIUM ) capsule 25 mg   feeding supplement (GLUCERNA SHAKE) (GLUCERNA SHAKE) liquid 237 mL   artificial tears ophthalmic solution 2 drop   EPINEPHrine  (EPI-PEN) injection 0.3 mg   gabapentin  (NEURONTIN ) capsule 300 mg   [COMPLETED] insulin  aspart (novoLOG ) injection 2 Units   PTA Medications  Medication Sig   EPINEPHrine  0.3 mg/0.3 mL IJ SOAJ injection Inject 0.3 mg into the muscle as needed for anaphylaxis.   nitroGLYCERIN  (NITROSTAT ) 0.4 MG SL tablet Place 1 tablet (0.4 mg total) under the tongue every 5 (five) minutes x 3 doses as needed for chest pain.  (Patient not taking: Reported on 01/25/2024)   Polyethyl Glycol-Propyl Glycol (SYSTANE) 0.4-0.3 % GEL ophthalmic gel Place 1 Application into both eyes daily as needed (For dry eyes).   Cholecalciferol  25 MCG (1000 UT) tablet Take 1,000 Units by mouth daily.   Multiple Vitamin (MULTIVITAMIN WITH MINERALS) TABS tablet Take 1 tablet by mouth daily.   LIVALO  4 MG TABS Take 1 tablet by mouth once daily (Patient taking differently: Take 4 mg by mouth daily.)   amLODipine  (NORVASC ) 10 MG tablet Take 1 tablet (10 mg total) by mouth daily.   ezetimibe  (ZETIA ) 10 MG tablet Take 1 tablet (10 mg total) by mouth daily.   aspirin  EC 81 MG tablet Take 81 mg by mouth daily.   clopidogrel  (PLAVIX ) 75 MG tablet Take 1 tablet by mouth once daily   fluticasone  (FLONASE ) 50 MCG/ACT nasal spray Place 2 sprays into both nostrils daily. (Patient not taking: Reported on 01/25/2024)   metoprolol  tartrate (LOPRESSOR ) 50 MG tablet Take 1 tablet (50 mg total) by mouth 2 (two) times daily.   cyclobenzaprine  (FLEXERIL ) 10 MG tablet Take 1 tablet (10 mg total) by mouth 3 (three) times daily as needed for muscle spasms. (Patient not taking: Reported on 01/25/2024)   busPIRone  (BUSPAR ) 10 MG tablet Take 1 tablet (10 mg total) by mouth 3 (three) times daily. (Patient not taking: Reported on 01/25/2024)   ACCU-CHEK GUIDE TEST test strip USE 1  STRIP TO CHECK GLUCOSE THREE TIMES DAILY AS DIRECTED   metFORMIN  (GLUCOPHAGE ) 500 MG tablet Take 500 mg by mouth 2 (two) times daily with a meal.   gabapentin  (NEURONTIN ) 300 MG capsule Take 1 capsule (300 mg total) by mouth 3 (three) times daily.       01/29/2024    9:08 AM 01/25/2024    2:50 AM 12/23/2023    9:07 AM  Depression screen PHQ 2/9  Decreased Interest 0 0 0  Down, Depressed, Hopeless 0 0 0  PHQ - 2 Score 0 0 0  Altered sleeping   0  Tired, decreased energy   3  Change in appetite   0  Feeling bad or failure about yourself    0  Trouble concentrating   0  Moving slowly or  fidgety/restless   0  Suicidal thoughts   0  PHQ-9 Score   3  Difficult doing work/chores   Not difficult at all    Flowsheet Row ED from 01/25/2024 in [facility] ED from 01/24/2024 in [facility] from 12/10/2023 in [facility] at [facility] RISK CATEGORY No Risk No Risk No Risk    Musculoskeletal  Strength & Muscle Tone: within normal limits Gait & Station: normal Patient leans: N/A  Psychiatric Specialty Exam  Presentation  General Appearance:  Appropriate for Environment  Eye Contact: Fair  Speech: Clear and Coherent  Speech Volume: Normal  Handedness: Right   Mood and Affect  Mood: Euthymic  Affect: Appropriate   Thought Process  Thought Processes: Coherent  Descriptions of Associations:Intact  Orientation:Full (Time, Place and Person)  Thought Content:Logical  Diagnosis of Schizophrenia or Schizoaffective disorder in past: No    Hallucinations:Hallucinations: None  Ideas of Reference:None  Suicidal Thoughts:Suicidal Thoughts: No  Homicidal Thoughts:Homicidal Thoughts: No   Sensorium  Memory: Immediate Fair  Judgment: Fair  Insight: Fair   Art therapist  Concentration: Fair  Attention Span: Fair  Recall: [employer] of Knowledge: Fair  Language: Fair   Psychomotor Activity  Psychomotor Activity: Psychomotor Activity: Normal   Assets  Assets: Communication Skills; Desire for Improvement; Leisure Time; Physical Health; Resilience; Social Support   Sleep  Sleep: Sleep: Fair  Estimated Sleeping Duration (Last 24 Hours): 11.75 hours  No data recorded  Physical Exam   General: Well developed, well nourished, [place] male Pupils: Normal at 3mm Respiratory: Breathing is unlabored.  Cardiovascular: No edema.  Language: No anomia, no aphasia Muscle strength and tone-pt moving all extremities.  Gait not assessed as pt  remained in bed.  Neuro: Facial muscles are symmetric. Pt without tremor, no evidence of hyperarousal.  Review of Systems  Constitutional: Negative.   HENT: Negative.    Eyes: Negative.   Respiratory: Negative.    Cardiovascular: Negative.   Gastrointestinal: Negative.   Musculoskeletal:  Positive for back pain.  Skin: Negative.   Neurological: Negative.   Endo/Heme/Allergies: Negative.   Psychiatric/Behavioral: Negative.     Blood pressure 127/83, pulse 85, temperature 98.1 F (36.7 C), temperature source Oral, resp. rate 18, SpO2 96%. There is no height or weight on file to calculate BMI.  Demographic Factors:  Male, Low socioeconomic status, Living alone, and Unemployed  Loss Factors: Decline in physical health and Financial problems/change in socioeconomic status  Historical Factors: NA  Risk Reduction Factors:   Religious beliefs about death, Positive social support, Positive therapeutic relationship, and Positive coping skills or problem solving skills  Continued  Clinical Symptoms:  Alcohol /Substance Abuse/Dependencies  Cognitive Features That Contribute To Risk:  None    Suicide Risk:  Minimal: No identifiable suicidal ideation.  Patients presenting with no risk factors but with morbid ruminations; may be classified as minimal risk based on the severity of the depressive symptoms  Patient denies SI or HI for >48 hours. Denies wanting to be dead and future oriented. SRA complete and acute risk for suicide is low.   [place] Suicide Risk assessment:  1. Do you wish to be dead? NONE REPORTED 2. Have you wished your dead or wished you could go to sleep and not wake up? NONE REPORTED 3.  Have you actually had thoughts of killing yourself?  NONE REPORTED 4.  Have you been thinking about how you might do this?  NONE REPORTED 5.  Have you had these thoughts and some intention of acting on them? NONE REPORTED 6.  Have you started to work out or worked out the details to  kill yourself? NONE REPORTED 7.  Do you intend to carry out this plan? NONE REPORTED 8. On a scale of 1-5 with 1 being the least severe and 5 being the most severe answer the following questions place for intensity of ideation. ZERO 9. How many times have you had these thoughts? NONE REPORTED 10. When you have the thoughts how long to the last?  NONE REPORTED 11. Control ability.  Could you or can you stop thinking about killing herself or wanting to die if you want to?  YES 12. Are there any things anyone or anything family religion pain of death that stop you from wanting to die or acting on thoughts of committing suicide?  FAMILY 13.  What sort of reason to do have to think about wanting to die or killing yourself? NONE REPORTED 14.Was it to end the pain or stop the way you are feeling in other words you could not go on living with his pain or how you are feeling or was not to get attention revenge or reaction from others?  Or both?  NONE REPORTED  [place] Suicide Risk assessment:  1. Do you wish to be dead? NONE REPORTED 2. Have you wished your dead or wished you could go to sleep and not wake up? NONE REPORTED 3.  Have you actually had thoughts of killing yourself?  NONE REPORTED 4.  Have you been thinking about how you might do this?  NONE REPORTED 5.  Have you had these thoughts and some intention of acting on them? NONE REPORTED 6.  Have you started to work out or worked out the details to kill yourself? NONE REPORTED 7.  Do you intend to carry out this plan? NONE REPORTED 8. On a scale of 1-5 with 1 being the least severe and 5 being the most severe answer the following questions place for intensity of ideation. ZERO 9. How many times have you had these thoughts? NONE REPORTED 10. When you have the thoughts how long to the last?  NONE REPORTED 11. Control ability.  Could you or can you stop thinking about killing herself or wanting to die if you want to?  YES 12. Are there any things  anyone or anything family religion pain of death that stop you from wanting to die or acting on thoughts of committing suicide?  FAMILY 13.  What sort of reason to do have to think about wanting to die or killing yourself? NONE REPORTED 14.Was it to end the pain  or stop the way you are feeling in other words you could not go on living with his pain or how you are feeling or was not to get attention revenge or reaction from others?  Or both?  NONE REPORTED   Plan Of Care/Follow-up recommendations:  Activity:  as tolerated Diet:  Diabetic diet PCP followup regarding blood sugars Disposition: discharge home with IOP referral  Eevie Lapp, MD 01/29/2024, 9:08 AM

## 2024-01-29 NOTE — ED Notes (Signed)
 Patient A&Ox4. Denies intent to harm self/others when asked. Denies A/VH. Patient denies any physical complaints when asked. No acute distress noted. Support and encouragement provided. Routine safety checks conducted according to facility protocol. Encouraged patient to notify staff if thoughts of harm toward self or others arise. Patient verbalize understanding and agreement. Will continue to monitor for safety.

## 2024-01-29 NOTE — ED Notes (Signed)
 Patient resting quietly in bed with eyes closed with unlabored breathing. Q 15 minute safety checks remain in place.  Pt remains safe on the unit at this time.

## 2024-01-29 NOTE — ED Notes (Signed)
 Pt observed in the dayroom and interacting appropriately on the milieu. He denies SI/HI/AVH. He denies physical symptoms. He is seen eating a snack and drinking fluids well at 1930. At 2110 his CBG was 321. NP notified and ordered to recheck CBG at 2300. At 2320 his CBG was 331. NP notified and ordered ETO Novolog  2 units. He is safe on the unit at this time with Q15 minute safety checks in place.

## 2024-01-29 NOTE — ED Notes (Signed)
 Patient A&O x 4, ambulatory. Patient discharged in no acute distress. Patient denied SI/HI, A/VH upon discharge. Patient verbalized understanding of all discharge instructions reviewed on AVS via staff, to include follow up appointments, RX's and safety. Suicide safety plan completed and reviewed with Clinical research associate. A copy given to pt. Patient reported mood 10/10.  Pt belongings returned to patient from locker #11 intact. Patient escorted to lobby via staff for transport to destination. Safety maintained.

## 2024-02-03 LAB — GLUCOSE, CAPILLARY: Glucose-Capillary: 233 mg/dL — ABNORMAL HIGH (ref 70–99)

## 2024-02-21 ENCOUNTER — Other Ambulatory Visit: Payer: Self-pay | Admitting: Nurse Practitioner

## 2024-03-06 ENCOUNTER — Ambulatory Visit (HOSPITAL_COMMUNITY): Payer: Self-pay

## 2024-03-10 ENCOUNTER — Ambulatory Visit (HOSPITAL_COMMUNITY): Payer: Self-pay

## 2024-03-13 ENCOUNTER — Ambulatory Visit (HOSPITAL_COMMUNITY): Payer: Self-pay

## 2024-03-14 ENCOUNTER — Ambulatory Visit (HOSPITAL_COMMUNITY)
Admission: RE | Admit: 2024-03-14 | Discharge: 2024-03-14 | Disposition: A | Discharge status: Home / Self Care | Payer: MEDICAID | Source: Ambulatory Visit | Attending: Family Medicine | Admitting: Family Medicine

## 2024-03-14 ENCOUNTER — Encounter (HOSPITAL_COMMUNITY): Payer: Self-pay

## 2024-03-14 VITALS — BP 151/102 | HR 61 | Temp 98.1°F | Resp 18

## 2024-03-14 DIAGNOSIS — M25551 Pain in right hip: Secondary | ICD-10-CM | POA: Insufficient documentation

## 2024-03-14 DIAGNOSIS — E1165 Type 2 diabetes mellitus with hyperglycemia: Secondary | ICD-10-CM | POA: Diagnosis not present

## 2024-03-14 DIAGNOSIS — R300 Dysuria: Secondary | ICD-10-CM | POA: Diagnosis not present

## 2024-03-14 LAB — POCT URINALYSIS DIP (MANUAL ENTRY)
Blood, UA: NEGATIVE
Glucose, UA: NEGATIVE mg/dL
Ketones, POC UA: NEGATIVE mg/dL
Leukocytes, UA: NEGATIVE
Nitrite, UA: NEGATIVE
Protein Ur, POC: 30 mg/dL — AB
Spec Grav, UA: >=1.03 — AB (ref 1.010–1.025)
Urobilinogen, UA: 0.2 U/dL
pH, UA: 5.5 (ref 5.0–8.0)

## 2024-03-14 LAB — POCT FASTING CBG KUC MANUAL ENTRY: POCT Glucose (KUC): 150 mg/dL — AB (ref 70–99)

## 2024-03-14 MED ORDER — PREDNISONE 50 MG PO TABS: ORAL_TABLET | ORAL | 0 refills | Status: DC

## 2024-03-14 NOTE — ED Triage Notes (Signed)
 Patient presents to the office for lower abdominal pain and dysuria x  2 weeks. Patient states he had groin pain for 3 months. He has not been able to see an urologist.

## 2024-03-14 NOTE — ED Provider Notes (Signed)
 [facility]ARE [facility]ENTER   [id number] 03/14/24 Arrival Time: 0846  ASSESSMENT & PLAN:  1. Dysuria   2. Pain in joint involving right pelvic region and thigh   3. Type 2 diabetes mellitus with hyperglycemia, without long-term current use of insulin  ([name])    U/A without signs of infection. Labs Reviewed  POCT URINALYSIS DIP (MANUAL ENTRY) - Abnormal; Notable for the following components:      Result Value   Color, UA orange (*)    Bilirubin, UA small (*)    Spec Grav, UA >=1.030 (*)    Protein Ur, POC =30 (*)    All other components within normal limits  POCT FASTING CBG KUC MANUAL ENTRY - Abnormal; Notable for the following components:   POCT Glucose (KUC) 150 (*)    All other components within normal limits  CYTOLOGY, (ORAL, ANAL, URETHRAL) ANCILLARY ONLY   Trial of prednisone ; discussed risk vs benefit. Wishes to proceed. Will check CBG regularly. Has urology f/u scheduled. My benefit from seeing neurology. Denies bowel/bladder problems.   Discharge Instructions      Be aware that your blood sugar level will increase while taking prednisone . Monitor closely.  We have sent testing for sexually transmitted infections. We will notify you of any positive results once they are received. If required, we will prescribe any medications you might need.  Please refrain from all sexual activity for at least the next seven days.     Pending: Labs Reviewed  POCT URINALYSIS DIP (MANUAL ENTRY) - Abnormal; Notable for the following components:      Result Value   Color, UA orange (*)    Bilirubin, UA small (*)    Spec Grav, UA >=1.030 (*)    Protein Ur, POC =30 (*)    All other components within normal limits  POCT FASTING CBG KUC MANUAL ENTRY - Abnormal; Notable for the following components:   POCT Glucose (KUC) 150 (*)    All other components within normal limits  CYTOLOGY, (ORAL, ANAL, URETHRAL) ANCILLARY ONLY    Will notify of any positive results. Instructed to refrain  from sexual activity for at least seven days.  Reviewed expectations re: course of current medical issues. Questions answered. Outlined signs and symptoms indicating need for more acute intervention. Patient verbalized understanding. After Visit Summary given.   SUBJECTIVE:  [name] is a 63 y.o. male who presents with complaint of R pelvic/inguinal pain. Many months. Feels may be related to mountain biking which he does not do anymore. Has self-dx himself with pudendal neuralgia. Denies dysfunction of bowels/bladder. Has been sexually active with new male partner in past two weeks. Ques STI. Would like testing.   OBJECTIVE:  Vitals:   03/14/24 0919 03/14/24 0921  BP: (!) 151/102   Pulse: 61   Resp: 18   Temp:  98.1 F (36.7 C)  TempSrc: Oral Oral  SpO2: 96%      General appearance: alert, cooperative, appears stated age and no distress GU: normal appearing genitalia Skin: warm and dry Psychological: alert and cooperative; normal mood and affect.  Results for orders placed or performed during the hospital encounter of 03/14/24  POC urinalysis dipstick   Collection Time: 03/14/24  9:25 AM  Result Value Ref Range   Color, UA orange (A) yellow   Clarity, UA clear clear   Glucose, UA negative negative mg/dL   Bilirubin, UA small (A) negative   Ketones, POC UA negative negative mg/dL   Spec Grav, UA >=8.969 (A)  1.010 - 1.025   Blood, UA negative negative   pH, UA 5.5 5.0 - 8.0   Protein Ur, POC =30 (A) negative mg/dL   Urobilinogen, UA 0.2 0.2 or 1.0 E.U./dL   Nitrite, UA Negative Negative   Leukocytes, UA Negative Negative  POC CBG monitoring   Collection Time: 03/14/24 10:06 AM  Result Value Ref Range   POCT Glucose (KUC) 150 (A) 70 - 99 mg/dL    Labs Reviewed  POCT URINALYSIS DIP (MANUAL ENTRY) - Abnormal; Notable for the following components:      Result Value   Color, UA orange (*)    Bilirubin, UA small (*)    Spec Grav, UA >=1.030 (*)    Protein  Ur, POC =30 (*)    All other components within normal limits  POCT FASTING CBG KUC MANUAL ENTRY - Abnormal; Notable for the following components:   POCT Glucose (KUC) 150 (*)    All other components within normal limits  CYTOLOGY, (ORAL, ANAL, URETHRAL) ANCILLARY ONLY    Allergies  Allergen Reactions   Bee Venom Anaphylaxis, Hives, Itching and Other (See Comments)    Red eyes   Gadolinium Hives, Itching, Swelling and Other (See Comments)    Swelling of eyes after receiving MR contrast. 13 hr prep recommended. Pt developed large hive and swelling under right eye after contrast, given 50mg  iv benadryl , pt will need premed before gadolinium//lh   Shellfish Allergy Anaphylaxis, Hives, Itching and Other (See Comments)    Red eyes   Statins Other (See Comments)    Myalgias. Tolerating livalo . Pain    Testosterone  Cypionate Other (See Comments)    Testerone Injection --Increased breast tissue     Past Medical History:  Diagnosis Date   Alcohol  dependence ([name])    Allergies    Arthritis    Chest pain    Chronic lower back pain    Chronic pain of right wrist    Cocaine abuse ([name])    per note on 01/18/23   Coronary artery disease    a. Multiple prior caths/PCI. Cath 2013 with possible spasm of RCA, 70% ISR of mid LCx with subsequent DES to mLCx and prox LCX. b. H/o microvascular angina. c. Recurrent angina 08/2014 - s/p PTCA/DES to prox Cx, PTCA/CBA to OM1.  c. LHC 06/10/15 with patent stents and some ISR in LCX and OM-1 that was not flow limiting --> Rx    Dyslipidemia    a. Intolerant to many statins except tolerating Livalo .   GERD (gastroesophageal reflux disease)    H/O cardiac catheterization 10/25/2018   Heart attack [facility])    Hypertension    Myocardial infarction [facility]) ~ 2010   Renal mass    S/P angioplasty with stent, DES, to proximal and mid LCX 12/15/11 12/15/2011   S/P foot surgery, right 04/2021   Shoulder pain    Stroke ([name])    pt. reports had a stroke around time of  MI 2010   Type II diabetes mellitus ([name])    Unstable angina ([name])    Family History  Problem Relation Age of Onset   Leukemia Mother    Prostate cancer Father    Cancer Brother    Coronary artery disease Paternal Grandmother    Cancer Paternal Grandfather    Migraines Neg Hx    Headache Neg Hx    Colon cancer Neg Hx    Esophageal cancer Neg Hx    Rectal cancer Neg Hx    Stomach cancer Neg  Hx    Social History   Socioeconomic History   Marital status: Widowed    Spouse name: Not on file   Number of children: 2   Years of education: GED   Highest education level: Some college, no degree  Occupational History   Not on file  Tobacco Use   Smoking status: Former    Current packs/day: 0.00    Average packs/day: 1 pack/day for 10.0 years (10.0 ttl pk-yrs)    Types: Cigarettes    Start date: 10/06/2008    Quit date: 10/07/2018    Years since quitting: 5.4   Smokeless tobacco: Never  Vaping Use   Vaping status: Never Used  Substance and Sexual Activity   Alcohol  use: Not Currently    Comment: 05/28/2023-stopped drinking 22 days ago.   Drug use: Not Currently    Types: Cocaine   Sexual activity: Not Currently  Other Topics Concern   Not on file  Social History Narrative   ** Merged History Encounter **    Pt states he only smoke when he drinks   Social Drivers of Health   Financial Resource Strain: Low Risk  (12/20/2023)   Overall Financial Resource Strain (CARDIA)    Difficulty of Paying Living Expenses: Not hard at all  Food Insecurity: No Food Insecurity (01/25/2024)   Hunger Vital Sign    Worried About Running Out of Food in the Last Year: Never true    Ran Out of Food in the Last Year: Never true  Transportation Needs: No Transportation Needs (01/25/2024)   PRAPARE - Administrator, Civil Service (Medical): No    Lack of Transportation (Non-Medical): No  Physical Activity: Sufficiently Active (12/20/2023)   Exercise Vital Sign    Days of Exercise per  Week: 4 days    Minutes of Exercise per Session: 40 min  Stress: No Stress Concern Present (12/20/2023)   [employer] of Occupational Health - Occupational Stress Questionnaire    Feeling of Stress: Only a little  Social Connections: Moderately Integrated (12/20/2023)   Social Connection and Isolation Panel    Frequency of Communication with Friends and Family: Three times a week    Frequency of Social Gatherings with Friends and Family: Once a week    Attends Religious Services: More than 4 times per year    Active Member of [employer] or Organizations: Yes    Attends Banker Meetings: More than 4 times per year    Marital Status: Widowed  Intimate Partner Violence: Not At Risk (01/25/2024)   Humiliation, Afraid, Rape, and Kick questionnaire    Fear of Current or Ex-Partner: No    Emotionally Abused: No    Physically Abused: No    Sexually Abused: No           Rolinda Rogue, MD 03/14/24 1023

## 2024-03-14 NOTE — Discharge Instructions (Signed)
 Be aware that your blood sugar level will increase while taking prednisone . Monitor closely.  We have sent testing for sexually transmitted infections. We will notify you of any positive results once they are received. If required, we will prescribe any medications you might need.  Please refrain from all sexual activity for at least the next seven days.

## 2024-03-15 LAB — CYTOLOGY, (ORAL, ANAL, URETHRAL) ANCILLARY ONLY
Chlamydia: NEGATIVE
Comment: NEGATIVE
Comment: NEGATIVE
Comment: NORMAL
Neisseria Gonorrhea: NEGATIVE
Trichomonas: NEGATIVE

## 2024-03-22 ENCOUNTER — Telehealth: Payer: Self-pay

## 2024-03-22 NOTE — Telephone Encounter (Signed)
   Pre-operative Risk Assessment    Patient Name: [name]  DOB: Mar 25, 1961 MRN: [id number]   Date of last office visit: 03/09/23 HAO MENG, PA Date of next office visit: NONE   Request for Surgical Clearance    Procedure:  MEDIAL BRANCH BLOCK, LUMBAR (PROC) - BILATERAL L3-, L4, L5 MEDIAL BRANCH BLOCK #1  Date of Surgery:  Clearance TBD                                Surgeon:  DR LAQUETA Surgeon's Group or Practice Name:  [facility] Phone number:  [phone]   EXT [id number] Fax number:  [phone]  ATTN: MELISSA B   Type of Clearance Requested:   - Medical  - Pharmacy:  Hold Clopidogrel  (Plavix ) 7 DAYS AND ASPIRIN ?   Type of Anesthesia:  Not Indicated   Additional requests/questions:    Signed, Lucie DELENA Ku   03/22/2024, 11:31 AM

## 2024-03-22 NOTE — Telephone Encounter (Signed)
Patient has been scheduled for in office visit.

## 2024-03-22 NOTE — Telephone Encounter (Signed)
   Name: [name]  DOB: 04-15-1961  MRN: [id number]  Primary Cardiologist: Vinie JAYSON Maxcy, MD  Chart reviewed as part of pre-operative protocol coverage. Because of Bryceson Grape Micco's past medical history and time since last visit, he will require a follow-up in-office visit in order to better assess preoperative cardiovascular risk.  Pre-op covering staff: - Please schedule appointment and call patient to inform them. If patient already had an upcoming appointment within acceptable timeframe, please add pre-op clearance to the appointment notes so provider is aware. - Please contact requesting surgeon's office via preferred method (i.e, phone, fax) to inform them of need for appointment prior to surgery.  Defer to Dr Maxcy for input on holding Plavix  as requested below so that this information is available to the clearing provider at time of patient's appointment.   Lamarr Satterfield, NP  03/22/2024, 11:39 AM

## 2024-03-24 ENCOUNTER — Ambulatory Visit: Payer: MEDICAID | Admitting: Nurse Practitioner

## 2024-03-27 ENCOUNTER — Encounter: Payer: Self-pay | Admitting: *Deleted

## 2024-03-27 ENCOUNTER — Ambulatory Visit: Payer: MEDICAID | Admitting: Physician Assistant

## 2024-03-28 ENCOUNTER — Encounter: Payer: Self-pay | Admitting: Emergency Medicine

## 2024-03-28 ENCOUNTER — Ambulatory Visit (INDEPENDENT_AMBULATORY_CARE_PROVIDER_SITE_OTHER): Payer: MEDICAID | Admitting: Emergency Medicine

## 2024-03-28 VITALS — BP 112/64 | HR 62 | Ht 68.0 in | Wt 187.2 lb

## 2024-03-28 DIAGNOSIS — Z720 Tobacco use: Secondary | ICD-10-CM | POA: Insufficient documentation

## 2024-03-28 DIAGNOSIS — Z01810 Encounter for preprocedural cardiovascular examination: Secondary | ICD-10-CM | POA: Diagnosis not present

## 2024-03-28 DIAGNOSIS — E785 Hyperlipidemia, unspecified: Secondary | ICD-10-CM | POA: Diagnosis not present

## 2024-03-28 DIAGNOSIS — F1010 Alcohol abuse, uncomplicated: Secondary | ICD-10-CM | POA: Insufficient documentation

## 2024-03-28 DIAGNOSIS — I10 Essential (primary) hypertension: Secondary | ICD-10-CM

## 2024-03-28 DIAGNOSIS — I2510 Atherosclerotic heart disease of native coronary artery without angina pectoris: Secondary | ICD-10-CM

## 2024-03-28 DIAGNOSIS — F1020 Alcohol dependence, uncomplicated: Secondary | ICD-10-CM | POA: Insufficient documentation

## 2024-03-28 LAB — COMPREHENSIVE METABOLIC PANEL WITH GFR
ALT: 28 IU/L (ref 0–44)
AST: 20 IU/L (ref 0–40)
Albumin: 4.7 g/dL (ref 3.9–4.9)
Alkaline Phosphatase: 70 IU/L (ref 47–123)
BUN/Creatinine Ratio: 17 (ref 10–24)
BUN: 17 mg/dL (ref 8–27)
Bilirubin Total: 0.4 mg/dL (ref 0.0–1.2)
CO2: 21 mmol/L (ref 20–29)
Calcium: 9.9 mg/dL (ref 8.6–10.2)
Chloride: 103 mmol/L (ref 96–106)
Creatinine, Ser: 1.02 mg/dL (ref 0.76–1.27)
Globulin, Total: 2.1 g/dL (ref 1.5–4.5)
Glucose: 137 mg/dL — ABNORMAL HIGH (ref 70–99)
Potassium: 4.5 mmol/L (ref 3.5–5.2)
Sodium: 141 mmol/L (ref 134–144)
Total Protein: 6.8 g/dL (ref 6.0–8.5)
eGFR: 83 mL/min/1.73 (ref >59)

## 2024-03-28 LAB — LIPID PANEL
Chol/HDL Ratio: 2.9 ratio (ref 0.0–5.0)
Cholesterol, Total: 143 mg/dL (ref 100–199)
HDL: 50 mg/dL (ref >39)
LDL Chol Calc (NIH): 70 mg/dL (ref 0–99)
Triglycerides: 130 mg/dL (ref 0–149)
VLDL Cholesterol Cal: 23 mg/dL (ref 5–40)

## 2024-03-28 MED ORDER — NITROGLYCERIN 0.4 MG SL SUBL: 0.4000 mg | SUBLINGUAL_TABLET | SUBLINGUAL | 9 refills | Status: AC | PRN

## 2024-03-28 NOTE — Progress Notes (Signed)
   03/28/24 2022  [facility] Triage Screening (Walk-ins at [facility] only)  How Did You Hear About Us ? Self  What Is the Reason for Your Visit/Call Today? Pt presents to [facility] as a voluntary walk-in, unaccompanied requesting detox/substance abuse treatment. Pt is scheduled for back surgery and wants to be sober prior to the surgery takes place. Pt reports drinking Gin earlier today (1/2 bottle) and cocaine about a week ago. Pt reports diagnosis of MDD and anxiety. Pt denies taking prescribed medications for psychiatric needs at this time. Pt currently denies SI,HI,AVH.  How Long Has This Been Causing You Problems? 1 wk - 1 month  Have You Recently Had Any Thoughts About Hurting Yourself? No  Are You Planning to Commit Suicide/Harm Yourself At This time? No  Have you Recently Had Thoughts About Hurting Someone [name]? No  Are You Planning To Harm Someone At This Time? No  Physical Abuse Denies  Verbal Abuse Denies  Sexual Abuse Denies  Exploitation of patient/patient's resources Denies  Self-Neglect Yes, present (Comment)  Possible abuse reported to:  (NA)  Are you currently experiencing any auditory, visual or other hallucinations? No  Have You Used Any Alcohol  or Drugs in the Past 24 Hours? Yes  What Did You Use and How Much? Gin (1/2 bottle)  Do you have any current medical co-morbidities that require immediate attention? No  Clinician description of patient physical appearance/behavior: calm, cooperative, casually dressed  What Do You Feel Would Help You the Most Today? Alcohol  or Drug Use Treatment  If access to [facility] was not available, would you have sought care in the Emergency Department? No  Determination of Need Urgent (48 hours)  Options For Referral Other: Comment;Facility-Based Crisis;Chemical Dependency Intensive Outpatient Therapy (CDIOP)  Determination of Need filed? Yes

## 2024-03-28 NOTE — Patient Instructions (Signed)
 Medication Instructions:  NO CHANGES  Lab Work: FASTING LIPID PANEL AND CMET TO BE DONE TODAY.  Testing/Procedures: NONE  Follow-Up: At [facility], you and your health needs are our priority.  As part of our continuing mission to provide you with exceptional heart care, our providers are all part of one team.  This team includes your primary Cardiologist (physician) and Advanced Practice Providers or APPs (Physician Assistants and Nurse Practitioners) who all work together to provide you with the care you need, when you need it.  Your next appointment:   3 MONTHS  Provider:   Vinie JAYSON Maxcy, MD

## 2024-03-28 NOTE — Progress Notes (Signed)
 Cardiology Office Note:    Date:  03/28/2024  ID:  [name], DOB 02-27-1961, MRN [id number] PCP: Oley Bascom RAMAN, NP  [facility] Providers Cardiologist:  Vinie JAYSON Maxcy, MD Cardiology APP:  Rana Lum LITTIE, NP       Patient Profile:       Chief Complaint: 1 year follow-up and preoperative clearance History of Present Illness:  [name] is a 63 y.o. male with visit-pertinent history of coronary artery disease, hypertension, hyperlipidemia, T2DM and microvascular angina  He had multiple PCI's and in-stent restenosis.  Cardiac catheterization performed 10/25/2018 showed 80% in-stent restenosis in proximal left circumflex vessel, patent mid RCA stent, 80% OM2 in-stent restenosis.  Access from the right subclavian artery was very difficult.  He ultimately underwent balloon angioplasty for the left circumflex and OM 2 lesion.  Unfortunately he developed restenosis in the left circumflex and OM 2 stent with 95% narrowing by April 2021 and underwent repeat balloon angioplasty reducing lesion down to 30%.  He was left on dual antiplatelet therapy with instruction to continue for at least 12 months, ideally longer.  Echocardiogram obtained on 09/29/2019 showed LVEF 55 to 60%, grade 1 DD, trivial MR.  He was last seen in clinic by Scot Ford, PA.  Patient continued to drink heavily and reported that he drinks from the time he wakes up until he passes out.  Patient denied any recurrent angina.  No changes are made he is to follow-up in 1 year.  He is now pending medial branch block, lumbar-bilateral L3, L4, L5 medial branch block with [facility]   Discussed the use of AI scribe software for clinical note transcription with the patient, who gave verbal consent to proceed.  History of Present Illness [name] is a 63 year old male with coronary artery disease who presents for preoperative clearance.  Today he is doing well overall.  He is without acute cardiovascular  concerns today.  He experiences no current chest pain, shortness of breath, leg swelling, dizziness, or lightheadedness. He takes Lovaza , Zetia , clopidogrel , and aspirin .  He has significant back pain worsening over the past month, leading to a sedentary lifestyle as of recently.  He has not been able to consistently exercise since early this summer.  Previously, he was active, going to the gym three times a week and biking daily.  He denied any exertional chest pains or prior anginal equivalent.  He is scheduled for a back procedure soon.  He has alcohol  use disorder with a recent relapse three months ago after eight months of sobriety. He consumes a bottle of gin every four days and smokes cigarettes when drinking, approximately one pack per week. He plans to enter detox tomorrow for three days.  He has not maintained a exercise regimen due to back pain. His LDL cholesterol was 96 in June.   Review of systems:  Please see the history of present illness. All other systems are reviewed and otherwise negative.      Studies Reviewed:    EKG Interpretation Date/Time:  Tuesday March 28 2024 08:42:32 EDT Ventricular Rate:  62 PR Interval:  148 QRS Duration:  76 QT Interval:  374 QTC Calculation: 379 R Axis:   -1  Text Interpretation: Normal sinus rhythm Nonspecific T wave abnormality When compared with ECG of 24-Jan-2024 22:26, Nonspecific T wave abnormality, improved in Lateral leads No significant change since last tracing Confirmed by Rana Lum [phone]) on 03/28/2024 1:03:02 PM    Cardiac catheterization  09/29/2019 Conclusions: Severe single-vessel coronary artery disease with diffuse in-stent restenosis of the LCx/OM2 stent with up to 95% narrowing.  There is also at least 80% stenosis involving the AV-groove LCx at the takeoff of OM2 with TIMI-2 flow at the start of the case and TIMI-1 flow following PTCA of the LCx/OM2 stents. Mild to moderate stenosis of the mid LAD with  improvement with intracoronary nitroglycerin , consistent with vasospasm. Patent RCA stent. Mildly elevated left ventricular filling pressure (LVEDP ~20 mmHg). Challenging IVUS-guided balloon angioplasty of LCx/OM2 in-stent restenosis with reduction in stenosis from 95% to 30%.   Recommendations: Aggressive medical therapy, including at least 12 months dual antiplatelet therapy (ideally longer). Titrate IV nitroglycerin  for relief of chest pain, most likely due to TIMI-1 flow in jailed AV-groove LCx. If patient has recurrent angina refractory to medical therapy and worsening in-stent restenosis, single-vessel CABG, brachytherapy, or drug-coated balloon angioplasty (currently investigational only) may need to be considered. Aggressive secondary prevention. Diagnostic Dominance: Right  Intervention   Echocardiogram 09/29/2019 1. Normal LV systolic function; grade 1 diastolic dysfunction; mild LVH.   2. Left ventricular ejection fraction, by estimation, is 55 to 60%. The  left ventricle has normal function. The left ventricle has no regional  wall motion abnormalities. There is mild left ventricular hypertrophy.  Left ventricular diastolic parameters  are consistent with Grade I diastolic dysfunction (impaired relaxation).   3. Right ventricular systolic function is normal. The right ventricular  size is normal.   4. The mitral valve is normal in structure. Trivial mitral valve  regurgitation. No evidence of mitral stenosis.   5. The aortic valve is tricuspid. Aortic valve regurgitation is not  visualized. No aortic stenosis is present.   6. The inferior vena cava is normal in size with greater than 50%  respiratory variability, suggesting right atrial pressure of 3 mmHg.   Cardiac catheterization 10/25/2018 Previously placed Mid RCA stent (unknown type) is widely patent. Prox Cx lesion is 80% stenosed, instent restenosis. Scoring balloon angioplasty was performed using a BALLOON  WOLVERINE 3.50X10, followed by a 3.75 [place] balloon. Post intervention, there is a 0% residual stenosis. 2nd Mrg lesion is 80% stenosed, instent restenosis. Scoring balloon angioplasty was performed using a BALLOON WOLVERINE 3.50X10, followed by a 3.75 [place] balloon. Post intervention, there is a 0% residual stenosis. Mid LAD lesion is 25% stenosed. Ost 2nd Mrg to 2nd Mrg lesion is 25% stenosed. There is no aortic valve stenosis. LV end diastolic pressure is normal. 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 Able to complete left circ PCI, but would not use right radial approach in the future. Prox Cx to Mid Cx lesion is 20% stenosed. Diagnostic Dominance: Right  Intervention   Cardiac catheterization 06/10/2015 Prox Cx to Mid Cx lesion, 40% stenosed. The lesion was previously treated with a stent (unknown type). 1st Mrg lesion, 45% stenosed. Dist LAD lesion, 35% stenosed.   Assessment:   1) 2 vessel CAD with patent stent in mRCA. Stents in proximal LCX and OM1 with mild to moderate ISE ~40-50%. 2) LVEDP 18   Plan/Discussion:   There is some ISR in the LCX and OM-1 but not flow-limiting. Will treat medically for now with aggressive risk modification. Films reviewed with Dr. Court of the interventional team. Can go home later today if post-cath course uncomplicated.    Risk Assessment/Calculations:              Physical Exam:   VS:  BP 112/64  Pulse 62   Ht 5' 8 (1.727 m)   Wt 187 lb 3.2 oz (84.9 kg)   SpO2 97%   BMI 28.46 kg/m    Wt Readings from Last 3 Encounters:  03/28/24 187 lb 3.2 oz (84.9 kg)  12/23/23 192 lb 3.2 oz (87.2 kg)  09/23/23 195 lb (88.5 kg)    GEN: Well nourished, well developed in no acute distress NECK: No JVD; No carotid bruits CARDIAC: RRR, no murmurs, rubs, gallops RESPIRATORY:  Clear to auscultation without rales, wheezing or rhonchi  ABDOMEN: Soft, non-tender, non-distended EXTREMITIES:  No edema; No  acute deformity      Assessment and Plan:  Coronary artery disease S/P multiple PCI's Last cath in 09/2019 with diffuse in-stent restenosis of the LCx/OM2 stent with up to 95% narrowing s/p balloon angioplasty of LCx/OM2 in-stent restenosis reduction in stenosis from 95% to 30%.  Patent RCA stent.  Mild to moderate stenosis of mid LAD - Today he continues to do well without any recurrent chest pains.  Remains fairly active without exertional symptoms.  No symptoms to suggest active angina.  No indication for further ischemic evaluation at this time - Continue aspirin  81 mg daily and clopidogrel  75 mg daily - Continue Livalo  4 mg daily  Hypertension Blood pressure today is well-controlled at 112/64 - Continue amlodipine  10 mg daily and metoprolol  tartrate 50 mg twice daily  Hyperlipidemia, LDL goal <70 LDL 96 on 11/2023 but not well-controlled On Livalo  4 mg daily (intolerant to atorvastatin , simvastatin and rosuvastatin ) - Repeat fasting lipid panel today and consider referral to Pharm.D. lipid clinic for consideration of PCSK9i if LDL remains above goal - Continue Livalo  4 mg daily and ezetimibe  10 mg daily  EtOH abuse Had relapse 3 months ago after 8 months of sobriety He consumes a bottle of gin every 4 days -Plans to enter detoxification program at [facility] behavioral health for 3 days starting tomorrow  Tobacco abuse Currently smoking 1 pack of cigarettes per week - Tobacco cessation encouraged  T2DM 7.2% on 11/2023 - Managed on metformin  - Follows with endocrinology  Preoperative cardiovascular clearance According to the Revised Cardiac Risk Index (RCRI), his Perioperative Risk of Major Cardiac Event is (%): 0.9. His Functional Capacity in METs is: 6.05 according to the Duke Activity Status Index (DASI).  Therefore, based on ACC/AHA guidelines, patient would be at acceptable risk for the planned procedure without further cardiovascular testing. I will route this  recommendation to the requesting party via Epic fax function.  Previously cleared by Dr. Mona in 06/2021 for anticoagulation holds: Would be okay to stop Plavix  5 days prior to surgery and aspirin  7 days prior to surgery and restart both afterwards once okay from a bleeding standpoint.       Dispo:  Return in about 3 months (around 06/27/2024).  Signed, Lum LITTIE Louis, NP

## 2024-03-29 ENCOUNTER — Ambulatory Visit (HOSPITAL_COMMUNITY)
Admission: EM | Admit: 2024-03-29 | Discharge: 2024-03-29 | Disposition: A | Discharge status: Other Acute Inpatient Hospital | Payer: MEDICAID | Attending: Nurse Practitioner | Admitting: Nurse Practitioner

## 2024-03-29 ENCOUNTER — Other Ambulatory Visit (HOSPITAL_COMMUNITY)
Admission: EM | Admit: 2024-03-29 | Discharge: 2024-03-31 | Disposition: A | Discharge status: Home / Self Care | Payer: MEDICAID | Source: Intra-hospital

## 2024-03-29 DIAGNOSIS — F331 Major depressive disorder, recurrent, moderate: Secondary | ICD-10-CM | POA: Insufficient documentation

## 2024-03-29 DIAGNOSIS — G4700 Insomnia, unspecified: Secondary | ICD-10-CM | POA: Diagnosis not present

## 2024-03-29 DIAGNOSIS — E1165 Type 2 diabetes mellitus with hyperglycemia: Secondary | ICD-10-CM | POA: Diagnosis not present

## 2024-03-29 DIAGNOSIS — F1020 Alcohol dependence, uncomplicated: Secondary | ICD-10-CM | POA: Insufficient documentation

## 2024-03-29 DIAGNOSIS — Z79899 Other long term (current) drug therapy: Secondary | ICD-10-CM | POA: Insufficient documentation

## 2024-03-29 DIAGNOSIS — I2510 Atherosclerotic heart disease of native coronary artery without angina pectoris: Secondary | ICD-10-CM | POA: Insufficient documentation

## 2024-03-29 DIAGNOSIS — F1910 Other psychoactive substance abuse, uncomplicated: Secondary | ICD-10-CM

## 2024-03-29 DIAGNOSIS — Z87891 Personal history of nicotine dependence: Secondary | ICD-10-CM | POA: Insufficient documentation

## 2024-03-29 DIAGNOSIS — Z7982 Long term (current) use of aspirin: Secondary | ICD-10-CM | POA: Insufficient documentation

## 2024-03-29 DIAGNOSIS — Z8249 Family history of ischemic heart disease and other diseases of the circulatory system: Secondary | ICD-10-CM | POA: Insufficient documentation

## 2024-03-29 DIAGNOSIS — Z7902 Long term (current) use of antithrombotics/antiplatelets: Secondary | ICD-10-CM | POA: Insufficient documentation

## 2024-03-29 DIAGNOSIS — G8929 Other chronic pain: Secondary | ICD-10-CM | POA: Insufficient documentation

## 2024-03-29 DIAGNOSIS — Z794 Long term (current) use of insulin: Secondary | ICD-10-CM | POA: Insufficient documentation

## 2024-03-29 DIAGNOSIS — I10 Essential (primary) hypertension: Secondary | ICD-10-CM | POA: Insufficient documentation

## 2024-03-29 DIAGNOSIS — Z7984 Long term (current) use of oral hypoglycemic drugs: Secondary | ICD-10-CM | POA: Insufficient documentation

## 2024-03-29 LAB — CBC WITH DIFFERENTIAL/PLATELET
Abs Immature Granulocytes: 0.03 K/uL (ref 0.00–0.07)
Basophils Absolute: 0 K/uL (ref 0.0–0.1)
Basophils Relative: 1 %
Eosinophils Absolute: 0.3 K/uL (ref 0.0–0.5)
Eosinophils Relative: 4 %
HCT: 43.4 % (ref 39.0–52.0)
Hemoglobin: 14.3 g/dL (ref 13.0–17.0)
Immature Granulocytes: 0 %
Lymphocytes Relative: 36 %
Lymphs Abs: 2.5 K/uL (ref 0.7–4.0)
MCH: 27.9 pg (ref 26.0–34.0)
MCHC: 32.9 g/dL (ref 30.0–36.0)
MCV: 84.6 fL (ref 80.0–100.0)
Monocytes Absolute: 0.5 K/uL (ref 0.1–1.0)
Monocytes Relative: 7 %
Neutro Abs: 3.7 K/uL (ref 1.7–7.7)
Neutrophils Relative %: 52 %
Platelets: 275 K/uL (ref 150–400)
RBC: 5.13 MIL/uL (ref 4.22–5.81)
RDW: 12.7 % (ref 11.5–15.5)
WBC: 7 K/uL (ref 4.0–10.5)
nRBC: 0 % (ref 0.0–0.2)

## 2024-03-29 LAB — POCT URINE DRUG SCREEN - MANUAL ENTRY (I-SCREEN)
POC Amphetamine UR: NOT DETECTED
POC Buprenorphine (BUP): NOT DETECTED
POC Cocaine UR: NOT DETECTED
POC Marijuana UR: NOT DETECTED
POC Methadone UR: NOT DETECTED
POC Methamphetamine UR: NOT DETECTED
POC Morphine: NOT DETECTED
POC Oxazepam (BZO): NOT DETECTED
POC Oxycodone UR: NOT DETECTED
POC Secobarbital (BAR): NOT DETECTED

## 2024-03-29 LAB — ETHANOL: Alcohol, Ethyl (B): <15 mg/dL (ref <15)

## 2024-03-29 LAB — GLUCOSE, CAPILLARY: Glucose-Capillary: 136 mg/dL — ABNORMAL HIGH (ref 70–99)

## 2024-03-29 MED ORDER — CLOPIDOGREL BISULFATE 75 MG PO TABS
75.0000 mg | ORAL_TABLET | Freq: Every day | ORAL | Status: DC
  Administered 2024-03-29 – 2024-03-31 (×3): 75 mg via ORAL
  Filled 2024-03-29 (×3): qty 1

## 2024-03-29 MED ORDER — AMLODIPINE BESYLATE 10 MG PO TABS: 10.0000 mg | ORAL_TABLET | Freq: Every day | ORAL | Status: DC

## 2024-03-29 MED ORDER — CHLORDIAZEPOXIDE HCL 25 MG PO CAPS: 25.0000 mg | ORAL_CAPSULE | Freq: Every day | ORAL | Status: DC

## 2024-03-29 MED ORDER — METOPROLOL TARTRATE 50 MG PO TABS
50.0000 mg | ORAL_TABLET | Freq: Two times a day (BID) | ORAL | Status: DC
  Administered 2024-03-29 – 2024-03-31 (×5): 50 mg via ORAL
  Filled 2024-03-29 (×5): qty 1

## 2024-03-29 MED ORDER — CHLORDIAZEPOXIDE HCL 25 MG PO CAPS
25.0000 mg | ORAL_CAPSULE | Freq: Four times a day (QID) | ORAL | Status: AC
  Administered 2024-03-29 (×4): 25 mg via ORAL
  Filled 2024-03-29 (×4): qty 1

## 2024-03-29 MED ORDER — EZETIMIBE 10 MG PO TABS
10.0000 mg | ORAL_TABLET | Freq: Every day | ORAL | Status: DC
Start: 2024-03-29 — End: 2024-03-31
  Administered 2024-03-29 – 2024-03-31 (×3): 10 mg via ORAL
  Filled 2024-03-29 (×3): qty 1

## 2024-03-29 MED ORDER — DIPHENHYDRAMINE HCL 50 MG/ML IJ SOLN: 50.0000 mg | Freq: Three times a day (TID) | INTRAMUSCULAR | Status: DC | PRN

## 2024-03-29 MED ORDER — HALOPERIDOL LACTATE 5 MG/ML IJ SOLN: 10.0000 mg | Freq: Three times a day (TID) | INTRAMUSCULAR | Status: DC | PRN

## 2024-03-29 MED ORDER — AMLODIPINE BESYLATE 5 MG PO TABS
5.0000 mg | ORAL_TABLET | Freq: Every day | ORAL | Status: DC
  Administered 2024-03-29 – 2024-03-31 (×3): 5 mg via ORAL
  Filled 2024-03-29 (×3): qty 1

## 2024-03-29 MED ORDER — CHLORDIAZEPOXIDE HCL 25 MG PO CAPS: 25.0000 mg | ORAL_CAPSULE | Freq: Four times a day (QID) | ORAL | Status: DC | PRN

## 2024-03-29 MED ORDER — METOPROLOL TARTRATE 50 MG PO TABS: 50.0000 mg | ORAL_TABLET | Freq: Two times a day (BID) | ORAL | Status: DC

## 2024-03-29 MED ORDER — THIAMINE HCL 100 MG/ML IJ SOLN
100.0000 mg | Freq: Once | INTRAMUSCULAR | Status: AC
  Administered 2024-03-29: 100 mg via INTRAMUSCULAR
  Filled 2024-03-29: qty 2

## 2024-03-29 MED ORDER — GLUCERNA SHAKE PO LIQD
237.0000 mL | Freq: Three times a day (TID) | ORAL | Status: DC
  Administered 2024-03-29 – 2024-03-31 (×6): 237 mL via ORAL
  Filled 2024-03-29 (×5): qty 237

## 2024-03-29 MED ORDER — HALOPERIDOL 5 MG PO TABS: 5.0000 mg | ORAL_TABLET | Freq: Three times a day (TID) | ORAL | Status: DC | PRN

## 2024-03-29 MED ORDER — HYDROXYZINE HCL 25 MG PO TABS: 25.0000 mg | ORAL_TABLET | Freq: Four times a day (QID) | ORAL | Status: DC | PRN

## 2024-03-29 MED ORDER — ADULT MULTIVITAMIN W/MINERALS CH
1.0000 | ORAL_TABLET | Freq: Every day | ORAL | Status: DC
  Administered 2024-03-29 – 2024-03-31 (×3): 1 via ORAL
  Filled 2024-03-29 (×3): qty 1

## 2024-03-29 MED ORDER — LORAZEPAM 2 MG/ML IJ SOLN: 2.0000 mg | Freq: Three times a day (TID) | INTRAMUSCULAR | Status: DC | PRN

## 2024-03-29 MED ORDER — LOPERAMIDE HCL 2 MG PO CAPS: 2.0000 mg | ORAL_CAPSULE | ORAL | Status: DC | PRN

## 2024-03-29 MED ORDER — METFORMIN HCL 500 MG PO TABS
500.0000 mg | ORAL_TABLET | Freq: Two times a day (BID) | ORAL | Status: DC
  Administered 2024-03-29 – 2024-03-31 (×5): 500 mg via ORAL
  Filled 2024-03-29 (×5): qty 1

## 2024-03-29 MED ORDER — ONDANSETRON 4 MG PO TBDP: 4.0000 mg | ORAL_TABLET | Freq: Four times a day (QID) | ORAL | Status: DC | PRN

## 2024-03-29 MED ORDER — CHLORDIAZEPOXIDE HCL 25 MG PO CAPS
25.0000 mg | ORAL_CAPSULE | Freq: Three times a day (TID) | ORAL | Status: AC
  Administered 2024-03-30 (×3): 25 mg via ORAL
  Filled 2024-03-29 (×3): qty 1

## 2024-03-29 MED ORDER — GABAPENTIN 300 MG PO CAPS
300.0000 mg | ORAL_CAPSULE | Freq: Three times a day (TID) | ORAL | Status: DC
  Administered 2024-03-29 – 2024-03-31 (×7): 300 mg via ORAL
  Filled 2024-03-29 (×7): qty 1

## 2024-03-29 MED ORDER — DIPHENHYDRAMINE HCL 50 MG PO CAPS: 50.0000 mg | ORAL_CAPSULE | Freq: Three times a day (TID) | ORAL | Status: DC | PRN

## 2024-03-29 MED ORDER — ALUM & MAG HYDROXIDE-SIMETH 200-200-20 MG/5ML PO SUSP: 30.0000 mL | ORAL | Status: DC | PRN

## 2024-03-29 MED ORDER — CHLORDIAZEPOXIDE HCL 25 MG PO CAPS
25.0000 mg | ORAL_CAPSULE | ORAL | Status: DC
  Administered 2024-03-31: 25 mg via ORAL
  Filled 2024-03-29: qty 1

## 2024-03-29 MED ORDER — MAGNESIUM HYDROXIDE 400 MG/5ML PO SUSP
30.0000 mL | Freq: Every day | ORAL | Status: DC | PRN
  Administered 2024-03-29 – 2024-03-30 (×2): 30 mL via ORAL
  Filled 2024-03-29 (×2): qty 30

## 2024-03-29 MED ORDER — ACETAMINOPHEN 325 MG PO TABS: 650.0000 mg | ORAL_TABLET | Freq: Four times a day (QID) | ORAL | Status: DC | PRN

## 2024-03-29 MED ORDER — HALOPERIDOL LACTATE 5 MG/ML IJ SOLN: 5.0000 mg | Freq: Three times a day (TID) | INTRAMUSCULAR | Status: DC | PRN

## 2024-03-29 MED ORDER — ASPIRIN 81 MG PO TBEC
81.0000 mg | DELAYED_RELEASE_TABLET | Freq: Every day | ORAL | Status: DC
  Administered 2024-03-29 – 2024-03-31 (×3): 81 mg via ORAL
  Filled 2024-03-29 (×3): qty 1

## 2024-03-29 NOTE — ED Provider Notes (Incomplete)
 Patient seen while rounding on other patients and requested diabetic friendly meal options. Counseled patient

## 2024-03-29 NOTE — ED Notes (Signed)
 Pt has been calm and subdued this shift.  Pt currently denies acute withdrawal symptoms.   Denied current SI plan and intent,  Denied HI and A/V hallucinations Q 15 minute observations for safety continue

## 2024-03-29 NOTE — ED Notes (Signed)
 Patient A&Ox4. Has been calm, cooperative, and appropriate with peers. Patient attended and participated in [place] meeting.  No S/S of acute alcohol  withdrawal noted. He denies intent to harm self/others. Denies A/VH. SABRA Patient denies any physical pain or discomfort. No acute distress observed. Routine safety checks conducted according to facility protocol. Patient agreed to notify staff should thoughts of harm toward self or others arise. We will continue to monitor for safety.

## 2024-03-29 NOTE — BH Assessment (Signed)
 Comprehensive Clinical Assessment (CCA) Note  03/29/2024 [name] [id number]  Disposition: Richerd Ivans, NP recommends pt to be admitted to Facility Based Crisis.   The patient demonstrates the following risk factors for suicide: Chronic risk factors for suicide include: substance use disorder. Acute risk factors for suicide include: Pt denies, SI. Protective factors for this patient include: positive social support and Pt denies, SI. Considering these factors, the overall suicide risk at this point appears to be no risk. Patient is not appropriate for outpatient follow up.  [name] is a 63 year old male who presents voluntary and unaccompanied to [facility] Clinician asked the pt, what brought you to the hospital? Pt reports requesting detox from alcohol . Pt denies, SI, HI, hallucinations, self-injurious behaviors and access to weapons.  Pt reports, he drinks almost everyday, he binge drinks until oblivion he rest 2-3 days then starts over. Pt reports, he drank half a bottle of Gin yesterday  (03/28/2024). Pt reports, he used a little bit of Cocaine about a week ago. Pt reports, he doesn't use Cocaine often. Pt has previous recovery admissions. Pt denies, being linked to OPT resources (medication management and/or counseling.)   Pt presents alert in casual attire with normal speech and eye contact. Pt's mood was pleasant. Pt's affect was congruent. Pt's insight was good. Pt's judgement was good.   Chief Complaint:  Chief Complaint  Patient presents with   Alcohol  Problem   Visit Diagnosis:  Alcohol  use disorder, severe, dependence ([name]).  CCA Screening, Triage and Referral (STR)  Patient Reported Information How did you hear about us ? Self  What Is the Reason for Your Visit/Call Today? Pt presents to [facility] as a voluntary walk-in, unaccompanied requesting detox/substance abuse treatment. Pt is scheduled for back surgery and  wants to be sober prior to the surgery takes place. Pt reports drinking Gin earlier today (1/2 bottle) and cocaine about a week ago. Pt reports diagnosis of MDD and anxiety. Pt denies taking prescribed medications for psychiatric needs at this time. Pt currently denies SI,HI,AVH.  How Long Has This Been Causing You Problems? 1 wk - 1 month  What Do You Feel Would Help You the Most Today? Alcohol  or Drug Use Treatment   Have You Recently Had Any Thoughts About Hurting Yourself? No  Are You Planning to Commit Suicide/Harm Yourself At This time? No   Flowsheet Row ED from 03/29/2024 in [facility] from 03/14/2024 in [facility] ED from 01/25/2024 in [facility]  C-SSRS RISK CATEGORY No Risk No Risk No Risk    Have you Recently Had Thoughts About Hurting Someone [name]? No  Are You Planning to Harm Someone at This Time? No  Explanation: NA   Have You Used Any Alcohol  or Drugs in the Past 24 Hours? Yes  How Long Ago Did You Use Drugs or Alcohol ? Yesterday (03/28/2024). What Did You Use and How Much? Gin (1/2 bottle)   Do You Currently Have a Therapist/Psychiatrist? No  Name of Therapist/Psychiatrist:    Have You Been Recently Discharged From Any Office Practice or Programs? No  Explanation of Discharge From Practice/Program: NA    CCA Screening Triage Referral Assessment Type of Contact: Face-to-Face  Telemedicine Service Delivery:   Is this Initial or Reassessment?   Date Telepsych consult ordered in CHL:    Time Telepsych consult ordered in CHL:    Location of Assessment: [facility]  Assessment [facility]  Provider Location: [facility]   Collateral Involvement: None.   Does Patient Have a Automotive engineer Guardian? No  Legal Guardian Contact Information: NA  Copy of Legal Guardianship Form: -- (NA)  Legal Guardian Notified of Arrival: -- (NA)  Legal Guardian  Notified of Pending Discharge: -- (NA)  If Minor and Not Living with Parent(s), Who has Custody? NA  Is CPS involved or ever been involved? -- (UTA)  Is APS involved or ever been involved? -- (UTA)   Patient Determined To Be At Risk for Harm To Self or Others Based on Review of Patient Reported Information or Presenting Complaint? No  Method: No Plan  Availability of Means: No access or NA  Intent: Vague intent or NA  Notification Required: No need or identified person  Additional Information for Danger to Others Potential: -- (NA)  Additional Comments for Danger to Others Potential: NA  Are There Guns or Other Weapons in Your Home? No  Types of Guns/Weapons: Pt denies,  Are These Weapons Safely Secured?                            -- (NA)  Who Could Verify You Are Able To Have These Secured: NA  Do You Have any Outstanding Charges, Pending Court Dates, Parole/Probation? Pt denies.  Contacted To Inform of Risk of Harm To Self or Others: Other: Comment (NA)    Does Patient Present under Involuntary Commitment? No    [place] of Residence: [place]   Patient Currently Receiving the Following Services: Not Receiving Services   Determination of Need: Urgent (48 hours)   Options For Referral: Other: Comment; Facility-Based Crisis; Chemical Dependency Intensive Outpatient Therapy (CDIOP)     CCA Biopsychosocial Patient Reported Schizophrenia/Schizoaffective Diagnosis in Past: No   Strengths: Pt is willing to engage in treatment.   Mental Health Symptoms Depression:  Fatigue; Sleep (too much or little); Increase/decrease in appetite   Duration of Depressive symptoms: Duration of Depressive Symptoms: N/A   Mania:  None   Anxiety:   Sleep; Fatigue; Worrying   Psychosis:  None   Duration of Psychotic symptoms:    Trauma:  None   Obsessions:  None   Compulsions:  None   Inattention:  None   Hyperactivity/Impulsivity:  Feeling of restlessness;  Fidgets with hands/feet   Oppositional/Defiant Behaviors:  N/A   Emotional Irregularity:  N/A   Other Mood/Personality Symptoms:  NA    Mental Status Exam Appearance and self-care  Stature:  Average   Weight:  Average weight   Clothing:  Casual   Grooming:  Normal   Cosmetic use:  None   Posture/gait:  Normal   Motor activity:  Not Remarkable   Sensorium  Attention:  Normal   Concentration:  Normal   Orientation:  X5   Recall/memory:  Normal   Affect and Mood  Affect:  Congruent   Mood:  Other (Comment) (Pleasant.)   Relating  Eye contact:  Normal   Facial expression:  Responsive   Attitude toward examiner:  Cooperative   Thought and Language  Speech flow: Normal   Thought content:  Appropriate to Mood and Circumstances   Preoccupation:  None   Hallucinations:  None   Organization:  Coherent   [employer] of Knowledge:  Average   Intelligence:  Average   Abstraction:  Normal   Judgement:  Normal   Reality Testing:  Adequate  Insight:  Good   Decision Making:  Normal   Social Functioning  Social Maturity:  Impulsive   Social Judgement:  Normal   Stress  Stressors:  Other (Comment) (People.)   Coping Ability:  Overwhelmed   Skill Deficits:  Self-control   Supports:  Family     Religion: Religion/Spirituality Are You A Religious Person?: Yes What is Your Religious Affiliation?: Christian How Might This Affect Treatment?: NA  Leisure/Recreation: Leisure / Recreation Do You Have Hobbies?: Yes Leisure and Hobbies: Pt reports, he's a musician, play bass guitar, lead guitar, keyboard, cello.  Exercise/Diet: Exercise/Diet Do You Exercise?: No What Type of Exercise Do You Do?: Other (Comment) (NA) How Many Times a Week Do You Exercise?:  (NA) Have You Gained or Lost A Significant Amount of Weight in the Past Six Months?: No Do You Follow a Special Diet?: No Type of Diet: NA Do You Have Any Trouble  Sleeping?: No Explanation of Sleeping Difficulties: Pt reports, over the summer has in the bed all day long in pain.   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 [place], Pharmacist, community. Did You Have An Individualized Education Program (IIEP): No Did You Have Any Difficulty At School?: No Patient's Education Has Been Impacted by Current Illness: No   CCA Family/Childhood History Family and Relationship History: Family history Marital status: Widowed Widowed, when?: Pt reports, his wife passed away 4 years ago. Does patient have children?: Yes How many children?: 2 How is patient's relationship with their children?: Pt reports, he has two adult children.  Childhood History:  Childhood History By whom was/is the patient raised?: Both parents Did patient suffer any verbal/emotional/physical/sexual abuse as a child?: No Did patient suffer from severe childhood neglect?: No Has patient ever been sexually abused/assaulted/raped as an adolescent or adult?: No Was the patient ever a victim of a crime or a disaster?: No Witnessed domestic violence?: No Has patient been affected by domestic violence as an adult?: No   CCA Substance Use Alcohol /Drug Use: Alcohol  / Drug Use Pain Medications: See MAR Prescriptions: See MAR Over the Counter: See MAR History of alcohol  / drug use?: Yes Longest period of sobriety (when/how long): 4 years and 1 month; this was from 2011 through 2015; he was involved in a good recovery program but got bored with recovery as things were going so well Negative  Consequences of Use: Financial, Personal relationships, Legal (has had five DWIs with the last being in 2005) Withdrawal Symptoms: None Substance #1 Name of Substance 1: Alcohol . 1 - Age of First Use: 23 1 - Amount (size/oz): Pt reports, he drinks almost everyday, he binge drinks until oblivion he rest 2-3 days then starts over. 1 - Frequency: Pt reports, almost everyday. 1 - Duration: Ongoing. 1 - Last Use / Amount: Tuesday (03/28/2024). 1 - Method of Aquiring: Purchase. 1- Route of Use: Oral.    ASAM's:  Six Dimensions of Multidimensional Assessment  Dimension 1:  Acute Intoxication and/or Withdrawal Potential:   Dimension 1:  Description of individual's past and current experiences of substance use and withdrawal: no withdrawal currently  Dimension 2:  Biomedical Conditions and Complications:   Dimension 2:  Description  of patient's biomedical conditions and  complications: unstable angina; had a heart attack in 2008; he has HTN and diabetes  Dimension 3:  Emotional, Behavioral, or Cognitive Conditions and Complications:  Dimension 3:  Description of emotional, behavioral, or cognitive conditions and complications: moderate anxiety and mild depression  Dimension 4:  Readiness to Change:  Dimension 4:  Description of Readiness to Change criteria: rates desire to quit as a 10 out of 10; he has concerns about his health  Dimension 5:  Relapse, Continued use, or Continued Problem Potential:  Dimension 5:  Relapse, continued use, or continued problem potential critiera description: has not been able to substain recovery  Dimension 6:  Recovery/Living Environment:  Dimension 6:  Recovery/Iiving environment criteria description: lives by himself with no triggers in his environment  ASAM Severity Score: ASAM's Severity Rating Score: 9  ASAM Recommended Level of Treatment: ASAM Recommended Level of Treatment: Level II Intensive Outpatient Treatment   Substance use Disorder (SUD) Substance Use  Disorder (SUD)  Checklist Symptoms of Substance Use: Continued use despite having a persistent/recurrent physical/psychological problem caused/exacerbated by use, Continued use despite persistent or recurrent social, interpersonal problems, caused or exacerbated by use, Evidence of tolerance, Persistent desire or unsuccessful efforts to cut down or control use, Recurrent use that results in a failure to fulfill major role obligations (work, school, home), Presence of craving or strong urge to use, Social, occupational, recreational activities given up or reduced due to use, Substance(s) often taken in larger amounts or over longer times than was intended, Evidence of withdrawal (Comment), Large amounts of time spent to obtain, use or recover from the substance(s)  Recommendations for Services/Supports/Treatments: Recommendations for Services/Supports/Treatments Recommendations For Services/Supports/Treatments: SAIOP (Substance Abuse Intensive Outpatient Program), Facility Based Crisis  Disposition Recommendation per psychiatric provider: Pt to be admitted to Facility Based Crisis.    DSM5 Diagnoses: Patient Active Problem List   Diagnosis Date Noted   Alcohol  use disorder, severe, dependence ([name]) 01/25/2024   Scrotal pain, right 08/26/2023   Epididymitis, right 08/26/2023   Complex renal cyst, right, 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/26/2023   Impingement syndrome of right shoulder 07/01/2023   Pain in right shoulder 06/16/2023   Anxiety disorder, unspecified 02/16/2023   Alcohol  use disorder 02/16/2023   Sleep disorder, unspecified 02/16/2023   Alcohol  abuse 02/15/2023   Alcohol  dependence ([name]) 05/16/2022   Low energy 03/06/2022   Moderate alcohol  dependence in early remission ([name]) 02/24/2022   Suicidal ideation 07/07/2021   Generalized anxiety disorder 07/07/2021   Other allergic rhinitis 09/09/2020   Adverse reaction to food, subsequent encounter 09/09/2020   History of bee sting allergy 09/09/2020    Alcohol  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) 03/19/2020   MDD (major depressive disorder), recurrent episode, severe ([name]) 03/18/2020   History of substance abuse ([name]) 01/04/2020   Severe 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 features ([name]) 12/10/2019   Tinea cruris 07/06/2019   Hemoglobin A1c less than 7.0% 07/06/2019   Muscle strain of wrist 04/05/2019   Pain in both wrists 04/05/2019   Anxiety 04/05/2019   Prediabetes 04/05/2019   MDD (major depressive disorder) 01/12/2019   Chest pain 10/25/2018   MDD (major depressive disorder), recurrent severe, without psychosis ([name]) 08/15/2018   Influenza A 07/17/2017   Lactic acidosis    Cough    Nausea and vomiting    Severe episode of recurrent major depressive disorder, without psychotic features ([name])    MDD (major depressive disorder), single episode, severe with psychotic features ([name]) 06/27/2017  MDD (major depressive disorder), severe ([name]) 06/23/2017   Essential hypertension 01/13/2016   Unstable angina [facility])    ED (erectile dysfunction) 02/15/2014   Atypical chest pain 12/14/2011   Allergy to contrast media (used for diagnostic x-rays) 05/12/2011   Dyslipidemia, goal LDL below 70 05/09/2011   DM2 (diabetes mellitus, type 2) ([name]) 07/10/2010   CAD (coronary artery disease) 07/10/2010     Referrals to Alternative Service(s): Referred to Alternative Service(s):   Place:   Date:   Time:    Referred to Alternative Service(s):   Place:   Date:   Time:    Referred to Alternative Service(s):   Place:   Date:   Time:    Referred to Alternative Service(s):   Place:   Date:   Time:     Jackson JONETTA Broach, [facility] Comprehensive Clinical Assessment (CCA) Screening, Triage and Referral Note  03/29/2024 [name] [id number]  Chief Complaint:  Chief Complaint  Patient presents with   Alcohol  Problem   Visit Diagnosis:   Patient Reported Information How did you hear about us ? Self  What Is the  Reason for Your Visit/Call Today? Pt presents to [facility] as a voluntary walk-in, unaccompanied requesting detox/substance abuse treatment. Pt is scheduled for back surgery and wants to be sober prior to the surgery takes place. Pt reports drinking Gin earlier today (1/2 bottle) and cocaine about a week ago. Pt reports diagnosis of MDD and anxiety. Pt denies taking prescribed medications for psychiatric needs at this time. Pt currently denies SI,HI,AVH.  How Long Has This Been Causing You Problems? 1 wk - 1 month  What Do You Feel Would Help You the Most Today? Alcohol  or Drug Use Treatment   Have You Recently Had Any Thoughts About Hurting Yourself? No  Are You Planning to Commit Suicide/Harm Yourself At This time? No   Have you Recently Had Thoughts About Hurting Someone [name]? No  Are You Planning to Harm Someone at This Time? No  Explanation: NA   Have You Used Any Alcohol  or Drugs in the Past 24 Hours? Yes  How Long Ago Did You Use Drugs or Alcohol ? Yesterday (03/28/2024). What Did You Use and How Much? Gin (1/2 bottle)   Do You Currently Have a Therapist/Psychiatrist? No  Name of Therapist/Psychiatrist: Pt denies.   Have You Been Recently Discharged From Any Office Practice or Programs? No  Explanation of Discharge From Practice/Program: NA   CCA Screening Triage Referral Assessment Type of Contact: Face-to-Face  Telemedicine Service Delivery:   Is this Initial or Reassessment?   Date Telepsych consult ordered in CHL:    Time Telepsych consult ordered in CHL:    Location of Assessment: [facility] Services  Provider Location: [facility] Services    Collateral Involvement: None.   Does Patient Have a Automotive engineer Guardian? No. Name and Contact of Legal Guardian: NA If Minor and Not Living with Parent(s), Who has Custody? NA  Is CPS involved or ever been involved? -- (UTA)  Is APS involved or ever been involved? -- (UTA)   Patient  Determined To Be At Risk for Harm To Self or Others Based on Review of Patient Reported Information or Presenting Complaint? No  Method: No Plan  Availability of Means: No access or NA  Intent: Vague intent or NA  Notification Required: No need or identified person  Additional Information for Danger to Others Potential: -- (NA)  Additional Comments for Danger to Others Potential: NA  Are  There Guns or Other Weapons in Your Home? No  Types of Guns/Weapons: Pt denies,  Are These Weapons Safely Secured?                            -- (NA)  Who Could Verify You Are Able To Have These Secured: NA  Do You Have any Outstanding Charges, Pending Court Dates, Parole/Probation? Pt denies.  Contacted To Inform of Risk of Harm To Self or Others: Other: Comment (NA)   Does Patient Present under Involuntary Commitment? No    [place] of Residence: [place]   Patient Currently Receiving the Following Services: Not Receiving Services   Determination of Need: Urgent (48 hours)   Options For Referral: Other: Comment; Facility-Based Crisis; Chemical Dependency Intensive Outpatient Therapy (CDIOP)   Disposition Recommendation per psychiatric provider: Pt to be admitted to Facility Based Crisis.   Jackson JONETTA Broach, LCMHC     Rivaan Kendall D Kylan Liberati, MS, [facility], [facility] Triage Specialist [phone]

## 2024-03-29 NOTE — ED Provider Notes (Signed)
 Facility Based Crisis Admission H&P  Date: 03/29/24 Patient Name: [name] MRN: [id number] Chief Complaint:  I'm needing detox.  Diagnoses:  Final diagnoses:  Alcohol  use disorder, severe, dependence ([name])  Substance abuse ([name])    HPI:  [name] is a 63 year old male with psychiatric history of GAD, MDD, alcohol  dependence, and medical history significant for stroke, heart attack and angioplasy with stent, who presented voluntarily as a walk in to [facility] at the recommendation of his cardiologist and requesting detox  from alcohol  abuse. Patient reports he is scheduled for back and possibly groin surgery, but recommended to detox first from alcohol  before the surgeries can done.  Patient was seen face to face by this provider and chart reviewed.  Patient reports I'm needing detox, I had an appointment with my cardiologist today and he suggested detox and after I finish with that, I'll be scheduled for back surgery and after that I may have surgery on my groin.  Patient reports he has been abusing alcohol  all summer for the past 3 months and I've been in bed for back and groin pain and before these problem started, I was in recovery for alcohol  but relapsed, now I have to go through detox before I have the surgeries.  Patient reports his surgeries will be done at [facility].  Patient reports he relapsed on alcohol  a month and a half ago and states I drink gin, a bottle of seagrams in 2 days, because I started having a little bit of shakes when I'm not drinking and I last drank yesterday morning.  I drink daily but lately, it's just to get rid of the shakes and drinking helps with the pain.  Patient reports past cocaine use, last used a week ago and states I don't use it often.  Patient reports he lives alone.  He denies access to a gun.  He is not established with a psychiatrist.  Patient reports he is prescribed several medications for heart disease, hypertension,  type 2 diabetes, neuropathic pain, cholesterol, etc.   Patient reports a prior recommendation for intensive outpatient substance abuse treatment and aftercare program but unable to complete it because of his chronic pain.  Patient reports his chronic pain has limited his life and abilities as he used to walk a lot and ride but haven't done that in months.  Patient completed the PHQ-9 questionnaire and obtained a total score of 14, indicating moderate depression.  On evaluation, patient is alert, oriented x 4, and cooperative. Speech is clear and coherent. Pt appears casually dressed. Eye contact is good. Mood is depressed, affect is flat and congruent with mood. Thought process is coherent and goal directed and thought content is WDL. Pt denies SI/HI/AVH. There is no objective indication that the patient is responding to internal stimuli. No delusions elicited during this assessment.    Discussed recommendation for admission to the [facility] for alcohol  detox/treatment.  Patient is provided with opportunity for questions.  He verbalized understanding and is in agreement.    PHQ 2-9:  Flowsheet Row ED from 03/29/2024 in [facility] Office Visit from 12/23/2023 in [facility] Dept Of [facility] Office Visit from 08/27/2023 in [facility] Of [facility]  Thoughts that you would be better off dead, or of hurting yourself in some way Not at all Not at all Not at all  PHQ-9  Total Score 14 3 0    Flowsheet Row ED from 03/29/2024 in [facility] UC from 03/14/2024 in [facility] ED from 01/25/2024 in [facility]  C-SSRS RISK CATEGORY No Risk No Risk No Risk      Total Time spent with patient: 30 minutes  Musculoskeletal  Strength & Muscle Tone: within normal limits Gait & Station: normal Patient leans:  N/A  Psychiatric Specialty Exam  Presentation General Appearance:  Casual  Eye Contact: Good  Speech: Clear and Coherent  Speech Volume: Normal  Handedness: Right   Mood and Affect  Mood: Depressed  Affect: Congruent; Flat   Thought Process  Thought Processes: Coherent; Goal Directed  Descriptions of Associations:Intact  Orientation:Full (Time, Place and Person)  Thought Content:WDL  Diagnosis of Schizophrenia or Schizoaffective disorder in past: No   Hallucinations:Hallucinations: None  Ideas of Reference:None  Suicidal Thoughts:Suicidal Thoughts: No  Homicidal Thoughts:Homicidal Thoughts: No   Sensorium  Memory: Immediate Good  Judgment: Intact  Insight: Present   Executive Functions  Concentration: Good  Attention Span: Good  Recall: Good  Fund of Knowledge: Good  Language: Good   Psychomotor Activity  Psychomotor Activity: Psychomotor Activity: Normal   Assets  Assets: Communication Skills; Desire for Improvement; Social Support   Sleep  Sleep: Sleep: Poor   Nutritional Assessment (For OBS and FBC admissions only) Has the patient had a weight loss or gain of 10 pounds or more in the last 3 months?: No Has the patient had a decrease in food intake/or appetite?: No Does the patient have dental problems?: No Does the patient have eating habits or behaviors that may be indicators of an eating disorder including binging or inducing vomiting?: No Has the patient recently lost weight without trying?: 0 Has the patient been eating poorly because of a decreased appetite?: 0 Malnutrition Screening Tool Score: 0    Physical Exam Constitutional:      General: He is not in acute distress.    Appearance: He is not diaphoretic.  HENT:     Nose: No congestion.  Pulmonary:     Effort: No respiratory distress.  Chest:     Chest wall: No tenderness.  Neurological:     Mental Status: He is alert and oriented to person,  place, and time.  Psychiatric:        Attention and Perception: Attention and perception normal.        Mood and Affect: Mood is depressed. Affect is flat.        Speech: Speech normal.        Behavior: Behavior is cooperative.        Thought Content: Thought content normal.    Review of Systems  Constitutional:  Negative for chills, diaphoresis and fever.  HENT:  Negative for congestion.   Eyes:  Negative for discharge.  Respiratory:  Negative for cough, shortness of breath and wheezing.   Cardiovascular:  Negative for chest pain and palpitations.  Gastrointestinal:  Negative for diarrhea, nausea and vomiting.  Neurological:  Negative for dizziness, seizures, loss of consciousness, weakness and headaches.  Psychiatric/Behavioral:  Positive for depression and substance abuse.     Past Psychiatric History: See H & P  Is the patient at risk to self? Yes  Has the patient been a risk to self in the past 6 months? Yes .    Has the patient been a risk to self within the distant past? Yes  Is the patient a risk to others? No   Has the patient been a risk to others in the past 6 months? No   Has the patient been a risk to others within the distant past? No   Past Medical History: See chart Family History: N/A Social History: N/A  Last Labs:  Admission on 03/29/2024  Component Date Value Ref Range Status   POC Amphetamine UR 03/29/2024 None Detected  NONE DETECTED (Cut Off Level 1000 ng/mL) Final   POC Secobarbital (BAR) 03/29/2024 None Detected  NONE DETECTED (Cut Off Level 300 ng/mL) Final   POC Buprenorphine (BUP) 03/29/2024 None Detected  NONE DETECTED (Cut Off Level 10 ng/mL) Final   POC Oxazepam (BZO) 03/29/2024 None Detected  NONE DETECTED (Cut Off Level 300 ng/mL) Final   POC Cocaine UR 03/29/2024 None Detected  NONE DETECTED (Cut Off Level 300 ng/mL) Final   POC Methamphetamine UR 03/29/2024 None Detected  NONE DETECTED (Cut Off Level 1000 ng/mL) Final   POC Morphine   03/29/2024 None Detected  NONE DETECTED (Cut Off Level 300 ng/mL) Final   POC Methadone UR 03/29/2024 None Detected  NONE DETECTED (Cut Off Level 300 ng/mL) Final   POC Oxycodone  UR 03/29/2024 None Detected  NONE DETECTED (Cut Off Level 100 ng/mL) Final   POC Marijuana UR 03/29/2024 None Detected  NONE DETECTED (Cut Off Level 50 ng/mL) Final  Office Visit on 03/28/2024  Component Date Value Ref Range Status   Glucose 03/28/2024 137 (H)  70 - 99 mg/dL Final   BUN 90/69/7974 17  8 - 27 mg/dL Final   Creatinine, Ser 03/28/2024 1.02  0.76 - 1.27 mg/dL Final   eGFR 90/69/7974 83  >59 mL/min/1.73 Final   BUN/Creatinine Ratio 03/28/2024 17  10 - 24 Final   Sodium 03/28/2024 141  134 - 144 mmol/L Final   Potassium 03/28/2024 4.5  3.5 - 5.2 mmol/L Final   Chloride 03/28/2024 103  96 - 106 mmol/L Final   CO2 03/28/2024 21  20 - 29 mmol/L Final   Calcium  03/28/2024 9.9  8.6 - 10.2 mg/dL Final   Total Protein 90/69/7974 6.8  6.0 - 8.5 g/dL Final   Albumin 90/69/7974 4.7  3.9 - 4.9 g/dL Final   Globulin, Total 03/28/2024 2.1  1.5 - 4.5 g/dL Final   Bilirubin Total 03/28/2024 0.4  0.0 - 1.2 mg/dL Final   Alkaline Phosphatase 03/28/2024 70  47 - 123 IU/L Final   AST 03/28/2024 20  0 - 40 IU/L Final   ALT 03/28/2024 28  0 - 44 IU/L Final   Cholesterol, Total 03/28/2024 143  100 - 199 mg/dL Final   Triglycerides 90/69/7974 130  0 - 149 mg/dL Final   HDL 90/69/7974 50  >39 mg/dL Final   VLDL Cholesterol Cal 03/28/2024 23  5 - 40 mg/dL Final   LDL Chol Calc (NIH) 03/28/2024 70  0 - 99 mg/dL Final   Chol/HDL Ratio 03/28/2024 2.9  0.0 - 5.0 ratio Final   Comment:                                   T. Chol/HDL Ratio                                             Men  Women  1/2 Avg.Risk  3.4    3.3                                   Avg.Risk  5.0    4.4                                2X Avg.Risk  9.6    7.1                                3X Avg.Risk 23.4   11.0   Admission on  03/14/2024, Discharged on 03/14/2024  Component Date Value Ref Range Status   Color, UA 03/14/2024 orange (A)  yellow Final   Clarity, UA 03/14/2024 clear  clear Final   Glucose, UA 03/14/2024 negative  negative mg/dL Final   Bilirubin, UA 90/83/7974 small (A)  negative Final   Ketones, POC UA 03/14/2024 negative  negative mg/dL Final   Spec Grav, UA 90/83/7974 >=1.030 (A)  1.010 - 1.025 Final   Blood, UA 03/14/2024 negative  negative Final   pH, UA 03/14/2024 5.5  5.0 - 8.0 Final   Protein Ur, POC 03/14/2024 =30 (A)  negative mg/dL Final   Urobilinogen, UA 03/14/2024 0.2  0.2 or 1.0 E.U./dL Final   Nitrite, UA 90/83/7974 Negative  Negative Final   Leukocytes, UA 03/14/2024 Negative  Negative Final   POCT Glucose ([facility]) 03/14/2024 150 (A)  70 - 99 mg/dL Final   Neisseria Gonorrhea 03/14/2024 Negative   Final   Chlamydia 03/14/2024 Negative   Final   Trichomonas 03/14/2024 Negative   Final   Comment 03/14/2024 Normal Reference Ranger Chlamydia - Negative   Final   Comment 03/14/2024 Normal Reference Range Neisseria Gonorrhea - Negative   Final   Comment 03/14/2024 Normal Reference Range Trichomonas - Negative   Final  Admission on 01/25/2024, Discharged on 01/29/2024  Component Date Value Ref Range Status   Glucose-Capillary 01/26/2024 177 (H)  70 - 99 mg/dL Final   Glucose reference range applies only to samples taken after fasting for at least 8 hours.   Glucose-Capillary 01/26/2024 151 (H)  70 - 99 mg/dL Final   Glucose reference range applies only to samples taken after fasting for at least 8 hours.   Glucose-Capillary 01/26/2024 146 (H)  70 - 99 mg/dL Final   Glucose reference range applies only to samples taken after fasting for at least 8 hours.   Glucose-Capillary 01/27/2024 162 (H)  70 - 99 mg/dL Final   Glucose reference range applies only to samples taken after fasting for at least 8 hours.   Glucose-Capillary 01/27/2024 126 (H)  70 - 99 mg/dL Final   Glucose reference range  applies only to samples taken after fasting for at least 8 hours.   Glucose-Capillary 01/27/2024 214 (H)  70 - 99 mg/dL Final   Glucose reference range applies only to samples taken after fasting for at least 8 hours.   Glucose-Capillary 01/27/2024 204 (H)  70 - 99 mg/dL Final   Glucose reference range applies only to samples taken after fasting for at least 8 hours.   Glucose-Capillary 01/28/2024 224 (H)  70 - 99 mg/dL Final   Glucose reference range applies only to samples taken after fasting for at least 8 hours.   Glucose-Capillary 01/28/2024 321 (H)  70 -  99 mg/dL Final   Glucose reference range applies only to samples taken after fasting for at least 8 hours.   Glucose-Capillary 01/28/2024 331 (H)  70 - 99 mg/dL Final   Glucose reference range applies only to samples taken after fasting for at least 8 hours.   Glucose-Capillary 01/29/2024 217 (H)  70 - 99 mg/dL Final   Glucose reference range applies only to samples taken after fasting for at least 8 hours.   Glucose-Capillary 01/28/2024 233 (H)  70 - 99 mg/dL Final   Glucose reference range applies only to samples taken after fasting for at least 8 hours.  Admission on 01/24/2024, Discharged on 01/25/2024  Component Date Value Ref Range Status   WBC 01/24/2024 6.1  4.0 - 10.5 K/uL Final   RBC 01/24/2024 4.92  4.22 - 5.81 MIL/uL Final   Hemoglobin 01/24/2024 13.9  13.0 - 17.0 g/dL Final   HCT 92/71/7974 41.2  39.0 - 52.0 % Final   MCV 01/24/2024 83.7  80.0 - 100.0 fL Final   MCH 01/24/2024 28.3  26.0 - 34.0 pg Final   MCHC 01/24/2024 33.7  30.0 - 36.0 g/dL Final   RDW 92/71/7974 13.2  11.5 - 15.5 % Final   Platelets 01/24/2024 261  150 - 400 K/uL Final   nRBC 01/24/2024 0.0  0.0 - 0.2 % Final   Neutrophils Relative % 01/24/2024 44  % Final   Neutro Abs 01/24/2024 2.7  1.7 - 7.7 K/uL Final   Lymphocytes Relative 01/24/2024 40  % Final   Lymphs Abs 01/24/2024 2.4  0.7 - 4.0 K/uL Final   Monocytes Relative 01/24/2024 7  % Final    Monocytes Absolute 01/24/2024 0.4  0.1 - 1.0 K/uL Final   Eosinophils Relative 01/24/2024 8  % Final   Eosinophils Absolute 01/24/2024 0.5  0.0 - 0.5 K/uL Final   Basophils Relative 01/24/2024 1  % Final   Basophils Absolute 01/24/2024 0.1  0.0 - 0.1 K/uL Final   Immature Granulocytes 01/24/2024 0  % Final   Abs Immature Granulocytes 01/24/2024 0.02  0.00 - 0.07 K/uL Final   Performed at [facility], [address] [place], [place] [address]   Sodium 01/24/2024 136  135 - 145 mmol/L Final   Potassium 01/24/2024 4.1  3.5 - 5.1 mmol/L Final   Chloride 01/24/2024 104  98 - 111 mmol/L Final   CO2 01/24/2024 21 (L)  22 - 32 mmol/L Final   Glucose, Bld 01/24/2024 213 (H)  70 - 99 mg/dL Final   Glucose reference range applies only to samples taken after fasting for at least 8 hours.   BUN 01/24/2024 9  8 - 23 mg/dL Final   Creatinine, Ser 01/24/2024 0.83  0.61 - 1.24 mg/dL Final   Calcium  01/24/2024 9.3  8.9 - 10.3 mg/dL Final   Total Protein 92/71/7974 6.5  6.5 - 8.1 g/dL Final   Albumin 92/71/7974 4.1  3.5 - 5.0 g/dL Final   AST 92/71/7974 23  15 - 41 U/L Final   ALT 01/24/2024 36  0 - 44 U/L Final   Alkaline Phosphatase 01/24/2024 61  38 - 126 U/L Final   Total Bilirubin 01/24/2024 0.7  0.0 - 1.2 mg/dL Final   GFR, Estimated 01/24/2024 >60  >60 mL/min Final   Comment: (NOTE) Calculated using the CKD-EPI Creatinine Equation (2021)    Anion gap 01/24/2024 11  5 - 15 Final   Performed at [facility], [address] [place],   [address]   Alcohol , Ethyl (B) 01/24/2024 <15  <15 mg/dL Final   Comment: (NOTE) For medical purposes only. Performed at [facility], [address], [place], [place] [address]    POC Amphetamine UR 01/24/2024 None Detected  NONE DETECTED (Cut Off Level 1000 ng/mL) Final   POC Secobarbital (BAR) 01/24/2024 None Detected  NONE DETECTED (Cut Off Level 300 ng/mL) Final   POC Buprenorphine (BUP) 01/24/2024 None Detected  NONE DETECTED (Cut  Off Level 10 ng/mL) Final   POC Oxazepam (BZO) 01/24/2024 None Detected  NONE DETECTED (Cut Off Level 300 ng/mL) Final   POC Cocaine UR 01/24/2024 None Detected  NONE DETECTED (Cut Off Level 300 ng/mL) Final   POC Methamphetamine UR 01/24/2024 None Detected  NONE DETECTED (Cut Off Level 1000 ng/mL) Final   POC Morphine  01/24/2024 None Detected  NONE DETECTED (Cut Off Level 300 ng/mL) Final   POC Methadone UR 01/24/2024 None Detected  NONE DETECTED (Cut Off Level 300 ng/mL) Final   POC Oxycodone  UR 01/24/2024 None Detected  NONE DETECTED (Cut Off Level 100 ng/mL) Final   POC Marijuana UR 01/24/2024 None Detected  NONE DETECTED (Cut Off Level 50 ng/mL) Final   Glucose-Capillary 01/24/2024 217 (H)  70 - 99 mg/dL Final   Glucose reference range applies only to samples taken after fasting for at least 8 hours.   Glucose-Capillary 01/25/2024 150 (H)  70 - 99 mg/dL Final   Glucose reference range applies only to samples taken after fasting for at least 8 hours.  Office Visit on 12/23/2023  Component Date Value Ref Range Status   Creatinine, Urine 12/23/2023 225.4  Not Estab. mg/dL Final   Microalbumin, Urine 12/23/2023 20.3  Not Estab. ug/mL Final   Microalb/Creat Ratio 12/23/2023 9  0 - 29 mg/g creat Final   Comment:                        Normal:                0 -  29                        Moderately increased: 30 - 300                        Severely increased:       >300    Hemoglobin A1C 12/23/2023 7.2 (A)  4.0 - 5.6 % Final   WBC 12/23/2023 5.4  3.4 - 10.8 x10E3/uL Final   RBC 12/23/2023 5.57  4.14 - 5.80 x10E6/uL Final   Hemoglobin 12/23/2023 15.4  13.0 - 17.7 g/dL Final   Hematocrit 93/73/7974 48.4  37.5 - 51.0 % Final   MCV 12/23/2023 87  79 - 97 fL Final   MCH 12/23/2023 27.6  26.6 - 33.0 pg Final   MCHC 12/23/2023 31.8  31.5 - 35.7 g/dL Final   RDW 93/73/7974 13.0  11.6 - 15.4 % Final   Platelets 12/23/2023 248  150 - 450 x10E3/uL Final   Glucose 12/23/2023 140 (H)  70 - 99 mg/dL  Final   BUN 93/73/7974 20  8 - 27 mg/dL Final   Creatinine, Ser 12/23/2023 1.06  0.76 - 1.27 mg/dL Final   eGFR 93/73/7974 79  >59 mL/min/1.73 Final   BUN/Creatinine Ratio 12/23/2023 19  10 - 24 Final   Sodium 12/23/2023 137  134 - 144 mmol/L Final   Potassium 12/23/2023 4.7  3.5 - 5.2 mmol/L Final   Chloride 12/23/2023 101  96 - 106 mmol/L Final   CO2 12/23/2023 18 (L)  20 - 29 mmol/L Final   Calcium  12/23/2023 10.3 (H)  8.6 - 10.2 mg/dL Final   Total Protein 93/73/7974 7.2  6.0 - 8.5 g/dL Final   Albumin 93/73/7974 4.9  3.9 - 4.9 g/dL Final   Globulin, Total 12/23/2023 2.3  1.5 - 4.5 g/dL Final   Bilirubin Total 12/23/2023 0.7  0.0 - 1.2 mg/dL Final   Alkaline Phosphatase 12/23/2023 84  44 - 121 IU/L Final   AST 12/23/2023 50 (H)  0 - 40 IU/L Final   ALT 12/23/2023 61 (H)  0 - 44 IU/L Final   Cholesterol, Total 12/23/2023 156  100 - 199 mg/dL Final   Triglycerides 93/73/7974 109  0 - 149 mg/dL Final   HDL 93/73/7974 40  >39 mg/dL Final   VLDL Cholesterol Cal 12/23/2023 20  5 - 40 mg/dL Final   LDL Chol Calc (NIH) 12/23/2023 96  0 - 99 mg/dL Final   Chol/HDL Ratio 12/23/2023 3.9  0.0 - 5.0 ratio Final   Comment:                                   T. Chol/HDL Ratio                                             Men  Women                               1/2 Avg.Risk  3.4    3.3                                   Avg.Risk  5.0    4.4                                2X Avg.Risk  9.6    7.1                                3X Avg.Risk 23.4   11.0    Prostate Specific Ag, Serum 12/23/2023 1.0  0.0 - 4.0 ng/mL Final   Comment: Roche ECLIA methodology. According to the American Urological Association, Serum PSA should decrease and remain at undetectable levels after radical prostatectomy. The AUA defines biochemical recurrence as an initial PSA value 0.2 ng/mL or greater followed by a subsequent confirmatory PSA value 0.2 ng/mL or greater. Values obtained with different assay methods or kits  cannot be used interchangeably. Results cannot be interpreted as absolute evidence of the presence or absence of malignant disease.     Allergies: Bee venom, Gadolinium, Shellfish allergy, Statins, and Testosterone  cypionate  Medications:  Facility Ordered Medications  Medication   acetaminophen  (TYLENOL ) tablet 650 mg   alum & mag hydroxide-simeth (MAALOX/MYLANTA) 200-200-20 MG/5ML suspension 30 mL   magnesium  hydroxide (MILK OF MAGNESIA) suspension 30 mL   thiamine  (VITAMIN B1) injection 100 mg   multivitamin with minerals tablet 1 tablet   chlordiazePOXIDE  (LIBRIUM ) capsule  25 mg   hydrOXYzine  (ATARAX ) tablet 25 mg   loperamide  (IMODIUM ) capsule 2-4 mg   ondansetron  (ZOFRAN -ODT) disintegrating tablet 4 mg   haloperidol  (HALDOL ) tablet 5 mg   And   diphenhydrAMINE  (BENADRYL ) capsule 50 mg   haloperidol  lactate (HALDOL ) injection 5 mg   And   diphenhydrAMINE  (BENADRYL ) injection 50 mg   And   LORazepam  (ATIVAN ) injection 2 mg   haloperidol  lactate (HALDOL ) injection 10 mg   And   diphenhydrAMINE  (BENADRYL ) injection 50 mg   And   LORazepam  (ATIVAN ) injection 2 mg   chlordiazePOXIDE  (LIBRIUM ) capsule 25 mg   Followed by   NOREEN ON 03/30/2024] chlordiazePOXIDE  (LIBRIUM ) capsule 25 mg   Followed by   NOREEN ON 03/31/2024] chlordiazePOXIDE  (LIBRIUM ) capsule 25 mg   Followed by   NOREEN ON 04/01/2024] chlordiazePOXIDE  (LIBRIUM ) capsule 25 mg   aspirin  EC tablet 81 mg   clopidogrel  (PLAVIX ) tablet 75 mg   gabapentin  (NEURONTIN ) capsule 300 mg   ezetimibe  (ZETIA ) tablet 10 mg   metFORMIN  (GLUCOPHAGE ) tablet 500 mg   amLODipine  (NORVASC ) tablet 5 mg   metoprolol  tartrate (LOPRESSOR ) tablet 50 mg   PTA Medications  Medication Sig   EPINEPHrine  0.3 mg/0.3 mL IJ SOAJ injection Inject 0.3 mg into the muscle as needed for anaphylaxis.   Polyethyl Glycol-Propyl Glycol (SYSTANE) 0.4-0.3 % GEL ophthalmic gel Place 1 Application into both eyes daily as needed (For dry eyes).    Cholecalciferol  25 MCG (1000 UT) tablet Take 1,000 Units by mouth daily.   Multiple Vitamin (MULTIVITAMIN WITH MINERALS) TABS tablet Take 1 tablet by mouth daily.   LIVALO  4 MG TABS Take 1 tablet by mouth once daily   ezetimibe  (ZETIA ) 10 MG tablet Take 1 tablet (10 mg total) by mouth daily.   aspirin  EC 81 MG tablet Take 81 mg by mouth daily.   clopidogrel  (PLAVIX ) 75 MG tablet Take 1 tablet by mouth once daily   fluticasone  (FLONASE ) 50 MCG/ACT nasal spray Place 2 sprays into both nostrils daily. (Patient not taking: Reported on 03/28/2024)   metoprolol  tartrate (LOPRESSOR ) 50 MG tablet Take 1 tablet (50 mg total) by mouth 2 (two) times daily.   ACCU-CHEK GUIDE TEST test strip USE 1 STRIP TO CHECK GLUCOSE THREE TIMES DAILY AS DIRECTED   metFORMIN  (GLUCOPHAGE ) 500 MG tablet Take 500 mg by mouth 2 (two) times daily with a meal.   gabapentin  (NEURONTIN ) 300 MG capsule Take 1 capsule (300 mg total) by mouth 3 (three) times daily.   amLODipine  (NORVASC ) 10 MG tablet Take 1 tablet by mouth once daily   predniSONE  (DELTASONE ) 50 MG tablet Take one tablet by mouth for 5 days.   nitroGLYCERIN  (NITROSTAT ) 0.4 MG SL tablet Place 1 tablet (0.4 mg total) under the tongue every 5 (five) minutes x 3 doses as needed for chest pain.    Long Term Goals: Improvement in symptoms so as ready for discharge  Short Term Goals: Patient will verbalize feelings in meetings with treatment team members., Patient will attend at least of 50% of the groups daily., Pt will complete the PHQ9 on admission, day 3 and discharge., Patient will participate in completing the [place] Suicide Severity Rating Scale, Patient will score a low risk of violence for 24 hours prior to discharge, and Patient will take medications as prescribed daily.  Medical Decision Making  Recommend admission to the [facility] for substance abuse treatment/alcohol  detox.   Recommend CIWA protocol-AUD  Home medications continued - Amlodipine  5 mg p.o. daily  essential hypertension - Aspirin  EC 81 mg p.o. daily for CAD - Plavix  25 mg p.o. daily CAD - ZETIA  10 mg p.o. daily for dyslipidemia - Gabapentin  300 mg p.o. 3 times daily for neuropathic pain - Metformin  500 mg p.o. twice daily for type 2 diabetes - Metoprolol  tartrate 50 mg p.o. twice daily for hypertension and heart disease  Other PRNs - Agitation protocol medications - Maalox, MOM, Tylenol    Recommendations  Based on my evaluation the patient does not appear to have an emergency medical condition.  Recommend admission to the [facility] for substance abuse treatment/alcohol  detox.  Thurman LULLA Ivans, NP 03/29/24  3:26 AM

## 2024-03-29 NOTE — ED Notes (Signed)
 Pt presents to [facility], requesting detox/substance abuse treatment. Pt is scheduled for back surgery and wants to be sober prior to the surgery takes place. Pt reports drinking Gin earlier today (1/2 bottle) and cocaine about a week ago. Pt reports diagnosis of MDD and anxiety. Admission process completed. Medication administered. Q15 safety checks in place.

## 2024-03-29 NOTE — Group Note (Signed)
 Group Topic: Relapse and Recovery  Group Date: 03/29/2024 Start Time: 2000 End Time: 2100 Facilitators: Joan Plowman B  Department: [facility]  Number of Participants: 10  Group Focus: abuse issues, chemical dependency education, chemical dependency issues, coping skills, daily focus, and dual diagnosis Treatment Modality:  Leisure Counsellor, Patient-Centered Therapy, Psychoeducation, and Spiritual Interventions utilized were leisure development, patient education, and support Purpose: enhance coping skills, increase insight, relapse prevention strategies, and trigger / craving management  Name: [name] Date of Birth: 1961/01/01  MR: [id number]    Level of Participation: active Quality of Participation: attentive and cooperative Interactions with others: gave feedback Mood/Affect: appropriate Triggers (if applicable): NA Cognition: coherent/clear Progress: Gaining insight Response: NA Plan: patient will be encouraged to keep going to groups  Patients Problems:  Patient Active Problem List   Diagnosis Date Noted   Alcohol  use disorder, severe, dependence ([name]) 01/25/2024   Scrotal pain, right 08/26/2023   Epididymitis, right 08/26/2023   Complex renal cyst, right, 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/26/2023   Impingement syndrome of right shoulder 07/01/2023   Pain in right shoulder 06/16/2023   Anxiety disorder, unspecified 02/16/2023   Alcohol  use disorder 02/16/2023   Sleep disorder, unspecified 02/16/2023   Alcohol  abuse 02/15/2023   Alcohol  dependence ([name]) 05/16/2022   Low energy 03/06/2022   Moderate alcohol  dependence in early remission ([name]) 02/24/2022   Suicidal ideation 07/07/2021   Generalized anxiety disorder 07/07/2021   Other allergic rhinitis 09/09/2020   Adverse reaction to food, subsequent encounter 09/09/2020   History of bee sting allergy 09/09/2020   Alcohol  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) 03/19/2020   MDD (major  depressive disorder), recurrent episode, severe ([name]) 03/18/2020   History of substance abuse ([name]) 01/04/2020   Severe 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 features ([name]) 12/10/2019   Tinea cruris 07/06/2019   Hemoglobin A1c less than 7.0% 07/06/2019   Muscle strain of wrist 04/05/2019   Pain in both wrists 04/05/2019   Anxiety 04/05/2019   Prediabetes 04/05/2019   MDD (major depressive disorder) 01/12/2019   Chest pain 10/25/2018   MDD (major depressive disorder), recurrent severe, without psychosis ([name]) 08/15/2018   Influenza A 07/17/2017   Lactic acidosis    Cough    Nausea and vomiting    Severe episode of recurrent major depressive disorder, without psychotic features ([name])    MDD (major depressive disorder), single episode, severe with psychotic features ([name]) 06/27/2017   MDD (major depressive disorder), severe ([name]) 06/23/2017   Essential hypertension 01/13/2016   Unstable angina ([name])    ED (erectile dysfunction) 02/15/2014   Atypical chest pain 12/14/2011   Allergy to contrast media (used for diagnostic x-rays) 05/12/2011   Dyslipidemia, goal LDL below 70 05/09/2011   DM2 (diabetes mellitus, type 2) ([name]) 07/10/2010   CAD (coronary artery disease) 07/10/2010

## 2024-03-29 NOTE — ED Notes (Signed)
 Pt observed lying in bed. Eyes closed respirations even and non labored. NAD q 15 minute observations continue for safety.

## 2024-03-29 NOTE — ED Notes (Signed)
 Librium  taper continues.   CIWA scores this shift have been 0.  Pt continues to deny s/s of ETOH withdrawal.   No s/s observed or reported Q 15 minute observations for safety continue

## 2024-03-29 NOTE — Group Note (Signed)
 Group Topic: Healthy Self Image and Positive Change  Group Date: 03/29/2024 Start Time: 1430 End Time: 1530 Facilitators: Liston Cooper PARAS, NT; Linnea Rob, NT  Department: [facility]  Number of Participants: 8  Group Focus: community group Treatment Modality:  Psychoeducation Interventions utilized were patient education Purpose: regain self-worth  Name: [name] Date of Birth: 1960-11-25  MR: [id number]    Level of Participation: active Quality of Participation: attentive Interactions with others: gave feedback Mood/Affect: appropriate Triggers (if applicable): n/a Cognition: coherent/clear Progress: Moderate Response: n/a Plan: follow-up needed  Patients Problems:  Patient Active Problem List   Diagnosis Date Noted   Alcohol  use disorder, severe, dependence ([name]) 01/25/2024   Scrotal pain, right 08/26/2023   Epididymitis, right 08/26/2023   Complex renal cyst, right, 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/26/2023   Impingement syndrome of right shoulder 07/01/2023   Pain in right shoulder 06/16/2023   Anxiety disorder, unspecified 02/16/2023   Alcohol  use disorder 02/16/2023   Sleep disorder, unspecified 02/16/2023   Alcohol  abuse 02/15/2023   Alcohol  dependence ([name]) 05/16/2022   Low energy 03/06/2022   Moderate alcohol  dependence in early remission ([name]) 02/24/2022   Suicidal ideation 07/07/2021   Generalized anxiety disorder 07/07/2021   Other allergic rhinitis 09/09/2020   Adverse reaction to food, subsequent encounter 09/09/2020   History of bee sting allergy 09/09/2020   Alcohol  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) 03/19/2020   MDD (major depressive disorder), recurrent episode, severe ([name]) 03/18/2020   History of substance abuse ([name]) 01/04/2020   Severe 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 features ([name]) 12/10/2019   Tinea cruris 07/06/2019   Hemoglobin A1c less than 7.0% 07/06/2019   Muscle strain of wrist  04/05/2019   Pain in both wrists 04/05/2019   Anxiety 04/05/2019   Prediabetes 04/05/2019   MDD (major depressive disorder) 01/12/2019   Chest pain 10/25/2018   MDD (major depressive disorder), recurrent severe, without psychosis ([name]) 08/15/2018   Influenza A 07/17/2017   Lactic acidosis    Cough    Nausea and vomiting    Severe episode of recurrent major depressive disorder, without psychotic features ([name])    MDD (major depressive disorder), single episode, severe with psychotic features ([name]) 06/27/2017   MDD (major depressive disorder), severe ([name]) 06/23/2017   Essential hypertension 01/13/2016   Unstable angina ([name])    ED (erectile dysfunction) 02/15/2014   Atypical chest pain 12/14/2011   Allergy to contrast media (used for diagnostic x-rays) 05/12/2011   Dyslipidemia, goal LDL below 70 05/09/2011   DM2 (diabetes mellitus, type 2) ([name]) 07/10/2010   CAD (coronary artery disease) 07/10/2010

## 2024-03-29 NOTE — Group Note (Signed)
 Group Topic: Recovery Basics  Group Date: 03/29/2024 Start Time: 1100 End Time: 1130 Facilitators: Carletha Iha, RN  Department: [facility]  Number of Participants: 6  Group Focus: nursing group Treatment Modality:  Psychoeducation Interventions utilized were patient education Purpose:  Medication Education Name: [name] Date of Birth: [birth year]  MR: [id number]    Level of Participation: active Quality of Participation: attentive Interactions with others: gave feedback Mood/Affect: appropriate Triggers (if applicable):  Cognition: coherent/clear Progress: Gaining insight Response:  Plan: patient will be encouraged to continue to take medication after discharge Patients Problems:  Patient Active Problem List   Diagnosis Date Noted   Alcohol  use disorder, severe, dependence ([name]) 01/25/2024   Scrotal pain, right 08/26/2023   Epididymitis, right 08/26/2023   Complex renal cyst, right, 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/26/2023   Impingement syndrome of right shoulder 07/01/2023   Pain in right shoulder 06/16/2023   Anxiety disorder, unspecified 02/16/2023   Alcohol  use disorder 02/16/2023   Sleep disorder, unspecified 02/16/2023   Alcohol  abuse 02/15/2023   Alcohol  dependence ([name]) 05/16/2022   Low energy 03/06/2022   Moderate alcohol  dependence in early remission ([name]) 02/24/2022   Suicidal ideation 07/07/2021   Generalized anxiety disorder 07/07/2021   Other allergic rhinitis 09/09/2020   Adverse reaction to food, subsequent encounter 09/09/2020   History of bee sting allergy 09/09/2020   Alcohol  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) 03/19/2020   MDD (major depressive disorder), recurrent episode, severe ([name]) 03/18/2020   History of substance abuse ([name]) 01/04/2020   Severe 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 features ([name]) 12/10/2019   Tinea cruris 07/06/2019   Hemoglobin A1c less than 7.0% 07/06/2019   Muscle strain of  wrist 04/05/2019   Pain in both wrists 04/05/2019   Anxiety 04/05/2019   Prediabetes 04/05/2019   MDD (major depressive disorder) 01/12/2019   Chest pain 10/25/2018   MDD (major depressive disorder), recurrent severe, without psychosis ([name]) 08/15/2018   Influenza A 07/17/2017   Lactic acidosis    Cough    Nausea and vomiting    Severe episode of recurrent major depressive disorder, without psychotic features ([name])    MDD (major depressive disorder), single episode, severe with psychotic features ([name]) 06/27/2017   MDD (major depressive disorder), severe ([name]) 06/23/2017   Essential hypertension 01/13/2016   Unstable angina ([name])    ED (erectile dysfunction) 02/15/2014   Atypical chest pain 12/14/2011   Allergy to contrast media (used for diagnostic x-rays) 05/12/2011   Dyslipidemia, goal LDL below 70 05/09/2011   DM2 (diabetes mellitus, type 2) ([name]) 07/10/2010   CAD (coronary artery disease) 07/10/2010

## 2024-03-29 NOTE — ED Notes (Signed)
 Patient currently attending AA meeting. He is attentive and engaged. No needs at this time.

## 2024-03-30 ENCOUNTER — Ambulatory Visit: Payer: Self-pay | Admitting: Emergency Medicine

## 2024-03-30 DIAGNOSIS — F1020 Alcohol dependence, uncomplicated: Secondary | ICD-10-CM | POA: Diagnosis not present

## 2024-03-30 DIAGNOSIS — F331 Major depressive disorder, recurrent, moderate: Secondary | ICD-10-CM | POA: Diagnosis not present

## 2024-03-30 DIAGNOSIS — E1165 Type 2 diabetes mellitus with hyperglycemia: Secondary | ICD-10-CM

## 2024-03-30 DIAGNOSIS — Z794 Long term (current) use of insulin: Secondary | ICD-10-CM | POA: Diagnosis not present

## 2024-03-30 DIAGNOSIS — G4700 Insomnia, unspecified: Secondary | ICD-10-CM | POA: Diagnosis not present

## 2024-03-30 LAB — GLUCOSE, CAPILLARY
Glucose-Capillary: 173 mg/dL — ABNORMAL HIGH (ref 70–99)
Glucose-Capillary: 194 mg/dL — ABNORMAL HIGH (ref 70–99)
Glucose-Capillary: 168 mg/dL — ABNORMAL HIGH (ref 70–99)

## 2024-03-30 MED ORDER — MULTI-VITAMIN/MINERALS PO TABS: 1.0000 | ORAL_TABLET | Freq: Every day | ORAL | 2 refills | Status: AC

## 2024-03-30 MED ORDER — CHOLECALCIFEROL 25 MCG (1000 UT) PO TABS: 2000.0000 [IU] | ORAL_TABLET | Freq: Every day | ORAL | 0 refills | Status: AC

## 2024-03-30 MED ORDER — THIAMINE HCL 100 MG PO TABS: 100.0000 mg | ORAL_TABLET | Freq: Every day | ORAL | 0 refills | Status: DC

## 2024-03-30 MED ORDER — ADULT MULTIVITAMIN W/MINERALS CH: 1.0000 | ORAL_TABLET | Freq: Every day | ORAL | 0 refills | Status: AC

## 2024-03-30 NOTE — Group Note (Signed)
 Group Topic: Social Support  Group Date: 03/30/2024 Start Time: 2000 End Time: 2030 Facilitators: Joan Plowman B  Department: [facility]  Number of Participants: 13  Group Focus: affirmation, communication, and social skills Treatment Modality:  Individual Therapy Interventions utilized were support Purpose: express feelings  Name: [name] Date of Birth: 1961/04/09  MR: [id number]    Level of Participation: active Quality of Participation: attentive and cooperative Interactions with others: gave feedback Mood/Affect: appropriate Triggers (if applicable): NA Cognition: coherent/clear Progress: Gaining insight Response: NA Plan: patient will be encouraged to keep going to groups  Patients Problems:  Patient Active Problem List   Diagnosis Date Noted   Alcohol  use disorder, severe, dependence ([name]) 01/25/2024   Scrotal pain, right 08/26/2023   Epididymitis, right 08/26/2023   Complex renal cyst, right, 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/26/2023   Impingement syndrome of right shoulder 07/01/2023   Pain in right shoulder 06/16/2023   Anxiety disorder, unspecified 02/16/2023   Alcohol  use disorder 02/16/2023   Sleep disorder, unspecified 02/16/2023   Alcohol  abuse 02/15/2023   Alcohol  dependence ([name]) 05/16/2022   Low energy 03/06/2022   Moderate alcohol  dependence in early remission ([name]) 02/24/2022   Suicidal ideation 07/07/2021   Generalized anxiety disorder 07/07/2021   Other allergic rhinitis 09/09/2020   Adverse reaction to food, subsequent encounter 09/09/2020   History of bee sting allergy 09/09/2020   Alcohol  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) 03/19/2020   MDD (major depressive disorder), recurrent episode, severe ([name]) 03/18/2020   History of substance abuse ([name]) 01/04/2020   Severe 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 features ([name]) 12/10/2019   Tinea cruris 07/06/2019   Hemoglobin A1c less than 7.0% 07/06/2019    Muscle strain of wrist 04/05/2019   Pain in both wrists 04/05/2019   Anxiety 04/05/2019   Prediabetes 04/05/2019   MDD (major depressive disorder) 01/12/2019   Chest pain 10/25/2018   MDD (major depressive disorder), recurrent severe, without psychosis ([name]) 08/15/2018   Influenza A 07/17/2017   Lactic acidosis    Cough    Nausea and vomiting    Severe episode of recurrent major depressive disorder, without psychotic features ([name])    MDD (major depressive disorder), single episode, severe with psychotic features ([name]) 06/27/2017   MDD (major depressive disorder), severe ([name]) 06/23/2017   Essential hypertension 01/13/2016   Unstable angina ([name])    ED (erectile dysfunction) 02/15/2014   Atypical chest pain 12/14/2011   Allergy to contrast media (used for diagnostic x-rays) 05/12/2011   Dyslipidemia, goal LDL below 70 05/09/2011   DM2 (diabetes mellitus, type 2) ([name]) 07/10/2010   CAD (coronary artery disease) 07/10/2010

## 2024-03-30 NOTE — ED Notes (Signed)
 Pt is sleeping at the moment, no acute distress noted. Q15 safety checks in place.

## 2024-03-30 NOTE — Group Note (Signed)
 Group Topic: Decisional Balance/Substance Abuse  Group Date: 03/30/2024 Start Time: 1225 End Time: 1330 Facilitators: Lonzell Dwayne RAMAN, NT MHT 2 Department: [facility]  Number of Participants: 3  Group Focus: abuse issues Treatment Modality:  Patient-Centered Therapy Interventions utilized were reality testing Purpose: relapse prevention strategies  Name: [name] Date of Birth: Jan 06, 1961  MR: [id number]    Level of Participation: Patient did attend groupactive Quality of Participation: attentive Interactions with others: gave feedback Mood/Affect: positive Triggers (if applicable): N/A Cognition: coherent/clear Progress: Significant Response: Appropriate  Plan: patient will be encouraged to be positive about his recovery and do not give up. Patients Problems:  Patient Active Problem List   Diagnosis Date Noted   Alcohol  use disorder, severe, dependence ([name]) 01/25/2024   Scrotal pain, right 08/26/2023   Epididymitis, right 08/26/2023   Complex renal cyst, right, 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/26/2023   Impingement syndrome of right shoulder 07/01/2023   Pain in right shoulder 06/16/2023   Anxiety disorder, unspecified 02/16/2023   Alcohol  use disorder 02/16/2023   Sleep disorder, unspecified 02/16/2023   Alcohol  abuse 02/15/2023   Alcohol  dependence ([name]) 05/16/2022   Low energy 03/06/2022   Moderate alcohol  dependence in early remission ([name]) 02/24/2022   Suicidal ideation 07/07/2021   Generalized anxiety disorder 07/07/2021   Other allergic rhinitis 09/09/2020   Adverse reaction to food, subsequent encounter 09/09/2020   History of bee sting allergy 09/09/2020   Alcohol  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) 03/19/2020   MDD (major depressive disorder), recurrent episode, severe ([name]) 03/18/2020   History of substance abuse ([name]) 01/04/2020   Severe 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 features ([name]) 12/10/2019   Tinea cruris  07/06/2019   Hemoglobin A1c less than 7.0% 07/06/2019   Muscle strain of wrist 04/05/2019   Pain in both wrists 04/05/2019   Anxiety 04/05/2019   Prediabetes 04/05/2019   MDD (major depressive disorder) 01/12/2019   Chest pain 10/25/2018   MDD (major depressive disorder), recurrent severe, without psychosis ([name]) 08/15/2018   Influenza A 07/17/2017   Lactic acidosis    Cough    Nausea and vomiting    Severe episode of recurrent major depressive disorder, without psychotic features ([name])    MDD (major depressive disorder), single episode, severe with psychotic features ([name]) 06/27/2017   MDD (major depressive disorder), severe ([name]) 06/23/2017   Essential hypertension 01/13/2016   Unstable angina ([name])    ED (erectile dysfunction) 02/15/2014   Atypical chest pain 12/14/2011   Allergy to contrast media (used for diagnostic x-rays) 05/12/2011   Dyslipidemia, goal LDL below 70 05/09/2011   DM2 (diabetes mellitus, type 2) ([name]) 07/10/2010   CAD (coronary artery disease) 07/10/2010

## 2024-03-30 NOTE — ED Provider Notes (Signed)
 FBC/OBS ASAP Discharge Summary  Date and Time: 03/30/2024 2:46 PM  Name: [name]  MRN:  [id number]   Discharge Diagnoses:  Final diagnoses:  Alcohol  use disorder, severe, dependence ([name])  Moderate episode of recurrent major depressive disorder ([name])  Type 2 diabetes mellitus with hyperglycemia, with long-term current use of insulin  ([name])  CAD with hypertension    Subjective: [name], 63 year old with a history of alcohol  use disorder, MDD, type 2 diabetes non-insulin -dependent, CAD with hypertension.  Patient directly admitted to the facility based crisis after presenting to [facility] on 03/29/2024 requesting detox from alcohol  as he still has not had his back surgery.  Patient was admitted here for alcohol  detox as recent as 01/25/2024 requesting alcohol  detox due to upcoming back surgery.  Patient is currently on a CIWA protocol with Librium  taper.   He is doing a lot better he says. He slept through the night last night. He says he feels ready to go. Appetite is high. I love to eat. He feels ready to return to his outpatient sobriety continuance treatment. He is anxious to get the surgery needed for his degenerative disc disease.   Stay Summary: Pt was admitted 03/28/24 and discharge 03/31/24. He had an uneventful course of tapering librium  for alcohol  withdrawal symptoms. Patient is here voluntarily and although his librium  taper would be completed 04/01/24 he requested to leave early so that he can do things he wants to do on Friday regarding his pet.   Total Time spent with patient: 15 minutes  Chronic Active Medical Problems Hypertension- BP mildly elevated, home medications of Metoprolol  50 mg BID and Amlodipine  10 mg daily prescribed for BP control. Hx of MI -ASA and Plavix  prescribed, pt followed by cardiology, [facility] (see chart review) Hyperlipidemia-Zetia  , allergy to statin therapy. Type 2 Diabetes, A1C 7.2 (3 months prior) Metformin  500 mg BID with  meals     Psychiatric History: MDD, GAD, AUD Past Medical History:      Past Medical History:  Diagnosis Date   Alcohol  dependence ([name])     Allergies     Arthritis     Chest pain     Chronic lower back pain     Chronic pain of right wrist     Cocaine abuse ([name])      per note on 01/18/23   Coronary artery disease      a. Multiple prior caths/PCI. Cath 2013 with possible spasm of RCA, 70% ISR of mid LCx with subsequent DES to mLCx and prox LCX. b. H/o microvascular angina. c. Recurrent angina 08/2014 - s/p PTCA/DES to prox Cx, PTCA/CBA to OM1.  c. LHC 06/10/15 with patent stents and some ISR in LCX and OM-1 that was not flow limiting --> Rx    Dyslipidemia      a. Intolerant to many statins except tolerating Livalo .   GERD (gastroesophageal reflux disease)     H/O cardiac catheterization 10/25/2018   Heart attack [facility])     Hypertension     Myocardial infarction [facility]) ~ 2010   Renal mass     S/P angioplasty with stent, DES, to proximal and mid LCX 12/15/11 12/15/2011   S/P foot surgery, right 04/2021   Shoulder pain     Stroke ([name])      pt. reports had a stroke around time of MI 2010   Type II diabetes mellitus ([name])     Unstable angina ([name])  Family History:      Family History  Problem Relation Age of Onset   Leukemia Mother     Prostate cancer Father     Cancer Brother     Coronary artery disease Paternal Grandmother     Cancer Paternal Grandfather     Migraines Neg Hx     Headache Neg Hx     Colon cancer Neg Hx     Esophageal cancer Neg Hx     Rectal cancer Neg Hx     Stomach cancer Neg Hx          Social History:  Social History         Socioeconomic History   Marital status: Widowed      Spouse name: Not on file   Number of children: 2   Years of education: GED   Highest education level: Some college, no degree  Occupational History   Not on file  Tobacco Use   Smoking status: Former      Current packs/day: 0.00      Average packs/day:  1 pack/day for 10.0 years (10.0 ttl pk-yrs)      Types: Cigarettes      Start date: 10/06/2008      Quit date: 10/07/2018      Years since quitting: 5.4   Smokeless tobacco: Never  Vaping Use   Vaping status: Never Used  Substance and Sexual Activity   Alcohol  use: Not Currently      Comment: 05/28/2023-stopped drinking 22 days ago.   Drug use: Not Currently      Types: Cocaine   Sexual activity: Not Currently  Other Topics Concern   Not on file  Social History Narrative    ** Merged History Encounter **     Pt states he only smoke when he drinks    Social Drivers of Health        Financial Resource Strain: Low Risk  (12/20/2023)    Overall Financial Resource Strain (CARDIA)     Difficulty of Paying Living Expenses: Not hard at all  Food Insecurity: No Food Insecurity (03/29/2024)    Hunger Vital Sign     Worried About Running Out of Food in the Last Year: Never true     Ran Out of Food in the Last Year: Never true  Transportation Needs: No Transportation Needs (03/29/2024)    PRAPARE - Therapist, art (Medical): No     Lack of Transportation (Non-Medical): No  Physical Activity: Sufficiently Active (12/20/2023)    Exercise Vital Sign     Days of Exercise per Week: 4 days     Minutes of Exercise per Session: 40 min  Stress: No Stress Concern Present (12/20/2023)    [employer] of Occupational Health - Occupational Stress Questionnaire     Feeling of Stress: Only a little  Social Connections: Moderately Integrated (12/20/2023)    Social Connection and Isolation Panel     Frequency of Communication with Friends and Family: Three times a week     Frequency of Social Gatherings with Friends and Family: Once a week     Attends Religious Services: More than 4 times per year     Active Member of [employer] or Organizations: Yes     Attends Banker Meetings: More than 4 times per year     Marital Status: Widowed  Intimate Partner Violence: Not  At Risk (03/29/2024)    Humiliation, Afraid, Rape, and Kick  questionnaire     Fear of Current or Ex-Partner: No     Emotionally Abused: No     Physically Abused: No     Sexually Abused: No    Tobacco Cessation:  N/A, patient does not currently use tobacco products  Current Medications:  Current Facility-Administered Medications  Medication Dose Route Frequency Provider Last Rate Last Admin   acetaminophen  (TYLENOL ) tablet 650 mg  650 mg Oral Q6H PRN Onuoha, Chinwendu V, NP       alum & mag hydroxide-simeth (MAALOX/MYLANTA) 200-200-20 MG/5ML suspension 30 mL  30 mL Oral Q4H PRN Onuoha, Chinwendu V, NP       amLODipine  (NORVASC ) tablet 5 mg  5 mg Oral Daily Onuoha, Chinwendu V, NP   5 mg at 03/30/24 9085   aspirin  EC tablet 81 mg  81 mg Oral Daily Onuoha, Chinwendu V, NP   81 mg at 03/30/24 9041   chlordiazePOXIDE  (LIBRIUM ) capsule 25 mg  25 mg Oral Q6H PRN Onuoha, Chinwendu V, NP       chlordiazePOXIDE  (LIBRIUM ) capsule 25 mg  25 mg Oral TID Onuoha, Chinwendu V, NP   25 mg at 03/30/24 9085   Followed by   NOREEN ON 03/31/2024] chlordiazePOXIDE  (LIBRIUM ) capsule 25 mg  25 mg Oral BH-qamhs Onuoha, Chinwendu V, NP       Followed by   NOREEN ON 04/01/2024] chlordiazePOXIDE  (LIBRIUM ) capsule 25 mg  25 mg Oral Daily Onuoha, Chinwendu V, NP       clopidogrel  (PLAVIX ) tablet 75 mg  75 mg Oral Daily Onuoha, Chinwendu V, NP   75 mg at 03/30/24 9085   haloperidol  (HALDOL ) tablet 5 mg  5 mg Oral TID PRN Onuoha, Chinwendu V, NP       And   diphenhydrAMINE  (BENADRYL ) capsule 50 mg  50 mg Oral TID PRN Onuoha, Chinwendu V, NP       haloperidol  lactate (HALDOL ) injection 5 mg  5 mg Intramuscular TID PRN Onuoha, Chinwendu V, NP       And   diphenhydrAMINE  (BENADRYL ) injection 50 mg  50 mg Intramuscular TID PRN Onuoha, Chinwendu V, NP       And   LORazepam  (ATIVAN ) injection 2 mg  2 mg Intramuscular TID PRN Onuoha, Chinwendu V, NP       haloperidol  lactate (HALDOL ) injection 10 mg  10 mg Intramuscular TID  PRN Onuoha, Chinwendu V, NP       And   diphenhydrAMINE  (BENADRYL ) injection 50 mg  50 mg Intramuscular TID PRN Onuoha, Chinwendu V, NP       And   LORazepam  (ATIVAN ) injection 2 mg  2 mg Intramuscular TID PRN Onuoha, Chinwendu V, NP       ezetimibe  (ZETIA ) tablet 10 mg  10 mg Oral Daily Onuoha, Chinwendu V, NP   10 mg at 03/30/24 0958   feeding supplement (GLUCERNA SHAKE) (GLUCERNA SHAKE) liquid 237 mL  237 mL Oral TID Arloa Suzen RAMAN, NP   237 mL at 03/30/24 0917   gabapentin  (NEURONTIN ) capsule 300 mg  300 mg Oral TID Onuoha, Chinwendu V, NP   300 mg at 03/30/24 9041   hydrOXYzine  (ATARAX ) tablet 25 mg  25 mg Oral Q6H PRN Onuoha, Chinwendu V, NP       loperamide  (IMODIUM ) capsule 2-4 mg  2-4 mg Oral PRN Onuoha, Chinwendu V, NP       magnesium  hydroxide (MILK OF MAGNESIA) suspension 30 mL  30 mL Oral Daily PRN Onuoha, Chinwendu V, NP   30  mL at 03/29/24 1333   metFORMIN  (GLUCOPHAGE ) tablet 500 mg  500 mg Oral BID WC Onuoha, Chinwendu V, NP   500 mg at 03/30/24 0914   metoprolol  tartrate (LOPRESSOR ) tablet 50 mg  50 mg Oral BID Onuoha, Chinwendu V, NP   50 mg at 03/30/24 9085   multivitamin with minerals tablet 1 tablet  1 tablet Oral Daily Onuoha, Chinwendu V, NP   1 tablet at 03/30/24 9041   ondansetron  (ZOFRAN -ODT) disintegrating tablet 4 mg  4 mg Oral Q6H PRN Onuoha, Chinwendu V, NP       Current Outpatient Medications  Medication Sig Dispense Refill   ACCU-CHEK GUIDE TEST test strip USE 1 STRIP TO CHECK GLUCOSE THREE TIMES DAILY AS DIRECTED 300 each 0   amLODipine  (NORVASC ) 10 MG tablet Take 1 tablet by mouth once daily 90 tablet 0   aspirin  EC 81 MG tablet Take 81 mg by mouth daily.     Cholecalciferol  25 MCG (1000 UT) tablet Take 1,000 Units by mouth daily.     clopidogrel  (PLAVIX ) 75 MG tablet Take 1 tablet by mouth once daily 90 tablet 0   EPINEPHrine  0.3 mg/0.3 mL IJ SOAJ injection Inject 0.3 mg into the muscle as needed for anaphylaxis. 1 each 2   ezetimibe  (ZETIA ) 10 MG tablet  Take 1 tablet (10 mg total) by mouth daily. 90 tablet 0   fluticasone  (FLONASE ) 50 MCG/ACT nasal spray Place 2 sprays into both nostrils daily. (Patient taking differently: Place 2 sprays into both nostrils daily as needed for allergies.) 9.9 mL 2   gabapentin  (NEURONTIN ) 300 MG capsule Take 1 capsule (300 mg total) by mouth 3 (three) times daily. (Patient taking differently: Take 300 mg by mouth 3 (three) times daily as needed (For pain).) 90 capsule 0   metFORMIN  (GLUCOPHAGE ) 500 MG tablet Take 500 mg by mouth 2 (two) times daily with a meal.     metoprolol  tartrate (LOPRESSOR ) 50 MG tablet Take 1 tablet (50 mg total) by mouth 2 (two) times daily. 180 tablet 3   nitroGLYCERIN  (NITROSTAT ) 0.4 MG SL tablet Place 1 tablet (0.4 mg total) under the tongue every 5 (five) minutes x 3 doses as needed for chest pain. 25 tablet 9   Polyethyl Glycol-Propyl Glycol (SYSTANE) 0.4-0.3 % GEL ophthalmic gel Place 1 Application into both eyes daily as needed (For dry eyes).      PTA Medications:  Facility Ordered Medications  Medication   acetaminophen  (TYLENOL ) tablet 650 mg   alum & mag hydroxide-simeth (MAALOX/MYLANTA) 200-200-20 MG/5ML suspension 30 mL   magnesium  hydroxide (MILK OF MAGNESIA) suspension 30 mL   [COMPLETED] thiamine  (VITAMIN B1) injection 100 mg   multivitamin with minerals tablet 1 tablet   chlordiazePOXIDE  (LIBRIUM ) capsule 25 mg   hydrOXYzine  (ATARAX ) tablet 25 mg   loperamide  (IMODIUM ) capsule 2-4 mg   ondansetron  (ZOFRAN -ODT) disintegrating tablet 4 mg   haloperidol  (HALDOL ) tablet 5 mg   And   diphenhydrAMINE  (BENADRYL ) capsule 50 mg   haloperidol  lactate (HALDOL ) injection 5 mg   And   diphenhydrAMINE  (BENADRYL ) injection 50 mg   And   LORazepam  (ATIVAN ) injection 2 mg   haloperidol  lactate (HALDOL ) injection 10 mg   And   diphenhydrAMINE  (BENADRYL ) injection 50 mg   And   LORazepam  (ATIVAN ) injection 2 mg   [COMPLETED] chlordiazePOXIDE  (LIBRIUM ) capsule 25 mg   Followed  by   chlordiazePOXIDE  (LIBRIUM ) capsule 25 mg   Followed by   NOREEN ON 03/31/2024] chlordiazePOXIDE  (LIBRIUM ) capsule 25 mg  Followed by   NOREEN ON 04/01/2024] chlordiazePOXIDE  (LIBRIUM ) capsule 25 mg   aspirin  EC tablet 81 mg   clopidogrel  (PLAVIX ) tablet 75 mg   gabapentin  (NEURONTIN ) capsule 300 mg   ezetimibe  (ZETIA ) tablet 10 mg   metFORMIN  (GLUCOPHAGE ) tablet 500 mg   amLODipine  (NORVASC ) tablet 5 mg   metoprolol  tartrate (LOPRESSOR ) tablet 50 mg   feeding supplement (GLUCERNA SHAKE) (GLUCERNA SHAKE) liquid 237 mL   PTA Medications  Medication Sig   EPINEPHrine  0.3 mg/0.3 mL IJ SOAJ injection Inject 0.3 mg into the muscle as needed for anaphylaxis.   Polyethyl Glycol-Propyl Glycol (SYSTANE) 0.4-0.3 % GEL ophthalmic gel Place 1 Application into both eyes daily as needed (For dry eyes).   Cholecalciferol  25 MCG (1000 UT) tablet Take 1,000 Units by mouth daily.   ezetimibe  (ZETIA ) 10 MG tablet Take 1 tablet (10 mg total) by mouth daily.   aspirin  EC 81 MG tablet Take 81 mg by mouth daily.   clopidogrel  (PLAVIX ) 75 MG tablet Take 1 tablet by mouth once daily   fluticasone  (FLONASE ) 50 MCG/ACT nasal spray Place 2 sprays into both nostrils daily. (Patient taking differently: Place 2 sprays into both nostrils daily as needed for allergies.)   metoprolol  tartrate (LOPRESSOR ) 50 MG tablet Take 1 tablet (50 mg total) by mouth 2 (two) times daily.   ACCU-CHEK GUIDE TEST test strip USE 1 STRIP TO CHECK GLUCOSE THREE TIMES DAILY AS DIRECTED   metFORMIN  (GLUCOPHAGE ) 500 MG tablet Take 500 mg by mouth 2 (two) times daily with a meal.   gabapentin  (NEURONTIN ) 300 MG capsule Take 1 capsule (300 mg total) by mouth 3 (three) times daily. (Patient taking differently: Take 300 mg by mouth 3 (three) times daily as needed (For pain).)   amLODipine  (NORVASC ) 10 MG tablet Take 1 tablet by mouth once daily   nitroGLYCERIN  (NITROSTAT ) 0.4 MG SL tablet Place 1 tablet (0.4 mg total) under the tongue every 5  (five) minutes x 3 doses as needed for chest pain.       03/29/2024    2:37 AM 01/29/2024    9:08 AM 01/25/2024    2:50 AM  Depression screen PHQ 2/9  Decreased Interest 2 0 0  Down, Depressed, Hopeless 2 0 0  PHQ - 2 Score 4 0 0  Altered sleeping 2    Tired, decreased energy 3    Change in appetite 3    Feeling bad or failure about yourself  0    Trouble concentrating 2    Moving slowly or fidgety/restless 0    Suicidal thoughts 0    PHQ-9 Score 14    Difficult doing work/chores Very difficult      Flowsheet Row ED from 03/29/2024 in [facility] Most recent reading at 03/29/2024  5:40 AM ED from 03/29/2024 in [facility] Most recent reading at 03/28/2024  8:43 PM UC from 03/14/2024 in [facility] Most recent reading at 03/14/2024  9:21 AM  C-SSRS RISK CATEGORY No Risk No Risk No Risk    Musculoskeletal  Strength & Muscle Tone: within normal limits Gait & Station: normal Patient leans: N/A  Psychiatric Specialty Exam  Presentation  General Appearance:  Appropriate for Environment  Eye Contact: Good  Speech: Normal Rate  Speech Volume: Normal  Handedness: Right   Mood and Affect  Mood: -- (ready to go)  Affect: Appropriate   Thought Process  Thought Processes: Coherent; Goal Directed  Descriptions of  Associations:Intact  Orientation:Full (Time, Place and Person)  Thought Content:Logical  Diagnosis of Schizophrenia or Schizoaffective disorder in past: No    Hallucinations:Hallucinations: None  Ideas of Reference:None  Suicidal Thoughts:Suicidal Thoughts: No  Homicidal Thoughts:Homicidal Thoughts: No   Sensorium  Memory: Immediate Good; Recent Good; Remote Good  Judgment: Intact  Insight: Present   Executive Functions  Concentration: Good  Attention Span: Good  Recall: Good  Fund of Knowledge: Good  Language: Good   Psychomotor Activity   Psychomotor Activity: Psychomotor Activity: Normal   Assets  Assets: Desire for Improvement; Communication Skills; Resilience   Sleep  Sleep: Sleep: Good 7+  Nutritional Assessment (For OBS and FBC admissions only) Has the patient had a weight loss or gain of 10 pounds or more in the last 3 months?: No Has the patient had a decrease in food intake/or appetite?: No Does the patient have dental problems?: No Does the patient have eating habits or behaviors that may be indicators of an eating disorder including binging or inducing vomiting?: No Has the patient recently lost weight without trying?: 0 Has the patient been eating poorly because of a decreased appetite?: 0 Malnutrition Screening Tool Score: 0    Physical Exam  Physical Exam Vitals and nursing note reviewed.  Constitutional:      Comments: overweight  HENT:     Head: Normocephalic and atraumatic.     Right Ear: External ear normal.     Left Ear: External ear normal.     Nose: Nose normal.  Eyes:     Conjunctiva/sclera: Conjunctivae normal.  Pulmonary:     Effort: Pulmonary effort is normal.  Musculoskeletal:        General: Normal range of motion.     Cervical back: Normal range of motion.  Neurological:     Mental Status: He is alert and oriented to person, place, and time.  Psychiatric:        Mood and Affect: Mood normal.    Review of Systems  Constitutional:  Negative for chills, fever and weight loss.  HENT:  Negative for hearing loss.   Respiratory:  Negative for cough.   Cardiovascular:  Negative for chest pain.  Gastrointestinal:  Negative for abdominal pain, heartburn, nausea and vomiting.  Musculoskeletal:  Negative for myalgias.  Neurological:  Negative for dizziness, tingling and headaches.  Psychiatric/Behavioral:  Positive for substance abuse. Negative for depression, hallucinations and suicidal ideas. The patient is not nervous/anxious and does not have insomnia.    Blood pressure  126/86, pulse 65, temperature 97.8 F (36.6 C), temperature source Oral, resp. rate 17, SpO2 100%. There is no height or weight on file to calculate BMI.  Demographic Factors:  Male  Loss Factors: NA  Historical Factors: NA  Risk Reduction Factors:   Religious beliefs about death, Positive social support, Positive therapeutic relationship, and Positive coping skills or problem solving skills  Continued Clinical Symptoms:  Depression:   Insomnia  Cognitive Features That Contribute To Risk:  None    Suicide Risk:  Minimal: No identifiable suicidal ideation.  Patients presenting with no risk factors but with morbid ruminations; may be classified as minimal risk based on the severity of the depressive symptoms  Plan Of Care/Follow-up recommendations:  Other:  Continue current outpatient treatment for the maintenance of sobriety.   Disposition: Family services [facility] outpatient treatment. Continue with outpatient providers for medical problems.  Garvin JINNY Gaines, MD 03/30/2024, 2:46 PM

## 2024-03-30 NOTE — Discharge Instructions (Addendum)
   Take all of you medications as prescribed by your mental healthcare provider.  Report any adverse effects and reactions from your medications to your outpatient provider promptly.  Do not engage in alcohol  and or illegal drug use while on prescription medicines. Keep all scheduled appointments. This is to ensure that you are getting refills on time and to avoid any interruption in your medication.  If you are unable to keep an appointment call to reschedule.  Be sure to follow up with resources and follow ups given. In the event of worsening symptoms call the crisis hotline, 911, and or go to the nearest emergency department for appropriate evaluation and treatment of symptoms. Follow-up with your primary care provider for your medical issues, concerns and or health care needs.     Patient will follow up with his established provider [facility] [address] [place], [place] [address] Tel [phone]

## 2024-03-30 NOTE — ED Provider Notes (Signed)
 Behavioral Health Progress Note  Date and Time: 03/30/2024 12:02 AM Name: [name] MRN:  [id number]  Subjective:  Could I get diabetic food, concerned about my sugar  Diagnosis:  Final diagnoses:  Alcohol  use disorder, severe, dependence ([name])  Substance abuse ([name])   Total Time spent with patient: 20 minutes  [name], 62 year old with a history of alcohol  use disorder, MDD, type 2 diabetes non-insulin -dependent, CAD with hypertension.  Patient directly admitted to the facility based crisis after presenting to [facility] on 03/29/2024 requesting detox from alcohol  as he still has not had his back surgery.  Patient was admitted here for alcohol  detox as recent as 01/25/2024 requesting alcohol  detox due to upcoming back surgery.  Patient is currently on a CIWA protocol with Librium  taper.  He denies any active symptoms although notably has a mild tremor with extension of his hands.  He denies any nausea or vomiting and asked reports that he feels good.  His only concern today is a request for a diabetic friendly meal options.  Patient has been admitted to this unit previously and has previously voiced concerns over the meal selections.  Patient was offered Glucerna as a protein supplement which she agreed.  Patient was also educated on how to reduce carbs via avoiding bread if off and eating more vegetables if meal is high carbs.  Patient verbalized understanding and denies any other complaints today.  He denies any suicidal, homicidal ideations.  He denies any auditory visual hallucinations or acute safety concerns.   Chronic Active Medical Problems Hypertension- BP mildly elevated, home medications of Metoprolol  50 mg BID and Amlodipine  10 mg daily prescribed for BP control. Hx of MI -ASA and Plavix  prescribed, pt followed by cardiology, [facility] Care at [facility] (see chart review) Hyperlipidemia-Zetia  , allergy to statin therapy. Type 2 Diabetes, A1C 7.2 (3 months prior) Metformin  500  mg BID with meals    Psychiatric History: MDD, GAD, AUD Past Medical History:  Past Medical History:  Diagnosis Date   Alcohol  dependence ([name])    Allergies    Arthritis    Chest pain    Chronic lower back pain    Chronic pain of right wrist    Cocaine abuse ([name])    per note on 01/18/23   Coronary artery disease    a. Multiple prior caths/PCI. Cath 2013 with possible spasm of RCA, 70% ISR of mid LCx with subsequent DES to mLCx and prox LCX. b. H/o microvascular angina. c. Recurrent angina 08/2014 - s/p PTCA/DES to prox Cx, PTCA/CBA to OM1.  c. LHC 06/10/15 with patent stents and some ISR in LCX and OM-1 that was not flow limiting --> Rx    Dyslipidemia    a. Intolerant to many statins except tolerating Livalo .   GERD (gastroesophageal reflux disease)    H/O cardiac catheterization 10/25/2018   Heart attack [facility])    Hypertension    Myocardial infarction [facility]) ~ 2010   Renal mass    S/P angioplasty with stent, DES, to proximal and mid LCX 12/15/11 12/15/2011   S/P foot surgery, right 04/2021   Shoulder pain    Stroke [facility])    pt. reports had a stroke around time of MI 2010   Type II diabetes mellitus ([name])    Unstable angina ([name])     Family History: Family History  Problem Relation Age of Onset   Leukemia Mother    Prostate cancer Father    Cancer Brother  Coronary artery disease Paternal Grandmother    Cancer Paternal Grandfather    Migraines Neg Hx    Headache Neg Hx    Colon cancer Neg Hx    Esophageal cancer Neg Hx    Rectal cancer Neg Hx    Stomach cancer Neg Hx     Social History:  Social History   Socioeconomic History   Marital status: Widowed    Spouse name: Not on file   Number of children: 2   Years of education: GED   Highest education level: Some college, no degree  Occupational History   Not on file  Tobacco Use   Smoking status: Former    Current packs/day: 0.00    Average packs/day: 1 pack/day for 10.0 years (10.0 ttl pk-yrs)    Types:  Cigarettes    Start date: 10/06/2008    Quit date: 10/07/2018    Years since quitting: 5.4   Smokeless tobacco: Never  Vaping Use   Vaping status: Never Used  Substance and Sexual Activity   Alcohol  use: Not Currently    Comment: 05/28/2023-stopped drinking 22 days ago.   Drug use: Not Currently    Types: Cocaine   Sexual activity: Not Currently  Other Topics Concern   Not on file  Social History Narrative   ** Merged History Encounter **    Pt states he only smoke when he drinks   Social Drivers of Health   Financial Resource Strain: Low Risk  (12/20/2023)   Overall Financial Resource Strain (CARDIA)    Difficulty of Paying Living Expenses: Not hard at all  Food Insecurity: No Food Insecurity (03/29/2024)   Hunger Vital Sign    Worried About Running Out of Food in the Last Year: Never true    Ran Out of Food in the Last Year: Never true  Transportation Needs: No Transportation Needs (03/29/2024)   PRAPARE - Administrator, Civil Service (Medical): No    Lack of Transportation (Non-Medical): No  Physical Activity: Sufficiently Active (12/20/2023)   Exercise Vital Sign    Days of Exercise per Week: 4 days    Minutes of Exercise per Session: 40 min  Stress: No Stress Concern Present (12/20/2023)   [employer] of Occupational Health - Occupational Stress Questionnaire    Feeling of Stress: Only a little  Social Connections: Moderately Integrated (12/20/2023)   Social Connection and Isolation Panel    Frequency of Communication with Friends and Family: Three times a week    Frequency of Social Gatherings with Friends and Family: Once a week    Attends Religious Services: More than 4 times per year    Active Member of [employer] or Organizations: Yes    Attends Banker Meetings: More than 4 times per year    Marital Status: Widowed  Intimate Partner Violence: Not At Risk (03/29/2024)   Humiliation, Afraid, Rape, and Kick questionnaire    Fear of Current or  Ex-Partner: No    Emotionally Abused: No    Physically Abused: No    Sexually Abused: No     Additional Social History:                         Sleep: Good  Appetite:  Good  Current Medications:  Current Facility-Administered Medications  Medication Dose Route Frequency Provider Last Rate Last Admin   acetaminophen  (TYLENOL ) tablet 650 mg  650 mg Oral Q6H PRN Onuoha, Chinwendu V, NP  alum & mag hydroxide-simeth (MAALOX/MYLANTA) 200-200-20 MG/5ML suspension 30 mL  30 mL Oral Q4H PRN Onuoha, Chinwendu V, NP       amLODipine  (NORVASC ) tablet 5 mg  5 mg Oral Daily Onuoha, Chinwendu V, NP   5 mg at 03/29/24 9165   aspirin  EC tablet 81 mg  81 mg Oral Daily Onuoha, Chinwendu V, NP   81 mg at 03/29/24 0910   chlordiazePOXIDE  (LIBRIUM ) capsule 25 mg  25 mg Oral Q6H PRN Onuoha, Chinwendu V, NP       chlordiazePOXIDE  (LIBRIUM ) capsule 25 mg  25 mg Oral TID Onuoha, Chinwendu V, NP       Followed by   NOREEN ON 03/31/2024] chlordiazePOXIDE  (LIBRIUM ) capsule 25 mg  25 mg Oral BH-qamhs Onuoha, Chinwendu V, NP       Followed by   NOREEN ON 04/01/2024] chlordiazePOXIDE  (LIBRIUM ) capsule 25 mg  25 mg Oral Daily Onuoha, Chinwendu V, NP       clopidogrel  (PLAVIX ) tablet 75 mg  75 mg Oral Daily Onuoha, Chinwendu V, NP   75 mg at 03/29/24 0911   haloperidol  (HALDOL ) tablet 5 mg  5 mg Oral TID PRN Onuoha, Chinwendu V, NP       And   diphenhydrAMINE  (BENADRYL ) capsule 50 mg  50 mg Oral TID PRN Onuoha, Chinwendu V, NP       haloperidol  lactate (HALDOL ) injection 5 mg  5 mg Intramuscular TID PRN Onuoha, Chinwendu V, NP       And   diphenhydrAMINE  (BENADRYL ) injection 50 mg  50 mg Intramuscular TID PRN Onuoha, Chinwendu V, NP       And   LORazepam  (ATIVAN ) injection 2 mg  2 mg Intramuscular TID PRN Onuoha, Chinwendu V, NP       haloperidol  lactate (HALDOL ) injection 10 mg  10 mg Intramuscular TID PRN Onuoha, Chinwendu V, NP       And   diphenhydrAMINE  (BENADRYL ) injection 50 mg  50 mg  Intramuscular TID PRN Onuoha, Chinwendu V, NP       And   LORazepam  (ATIVAN ) injection 2 mg  2 mg Intramuscular TID PRN Onuoha, Chinwendu V, NP       ezetimibe  (ZETIA ) tablet 10 mg  10 mg Oral Daily Onuoha, Chinwendu V, NP   10 mg at 03/29/24 0910   feeding supplement (GLUCERNA SHAKE) (GLUCERNA SHAKE) liquid 237 mL  237 mL Oral TID Arloa Suzen RAMAN, NP   237 mL at 03/29/24 2139   gabapentin  (NEURONTIN ) capsule 300 mg  300 mg Oral TID Onuoha, Chinwendu V, NP   300 mg at 03/29/24 2138   hydrOXYzine  (ATARAX ) tablet 25 mg  25 mg Oral Q6H PRN Onuoha, Chinwendu V, NP       loperamide  (IMODIUM ) capsule 2-4 mg  2-4 mg Oral PRN Onuoha, Chinwendu V, NP       magnesium  hydroxide (MILK OF MAGNESIA) suspension 30 mL  30 mL Oral Daily PRN Onuoha, Chinwendu V, NP   30 mL at 03/29/24 1333   metFORMIN  (GLUCOPHAGE ) tablet 500 mg  500 mg Oral BID WC Onuoha, Chinwendu V, NP   500 mg at 03/29/24 1709   metoprolol  tartrate (LOPRESSOR ) tablet 50 mg  50 mg Oral BID Onuoha, Chinwendu V, NP   50 mg at 03/29/24 2138   multivitamin with minerals tablet 1 tablet  1 tablet Oral Daily Onuoha, Chinwendu V, NP   1 tablet at 03/29/24 0910   ondansetron  (ZOFRAN -ODT) disintegrating tablet 4 mg  4 mg Oral Q6H  PRN Onuoha, Chinwendu V, NP       Current Outpatient Medications  Medication Sig Dispense Refill   ACCU-CHEK GUIDE TEST test strip USE 1 STRIP TO CHECK GLUCOSE THREE TIMES DAILY AS DIRECTED 300 each 0   amLODipine  (NORVASC ) 10 MG tablet Take 1 tablet by mouth once daily 90 tablet 0   aspirin  EC 81 MG tablet Take 81 mg by mouth daily.     Cholecalciferol  25 MCG (1000 UT) tablet Take 1,000 Units by mouth daily.     clopidogrel  (PLAVIX ) 75 MG tablet Take 1 tablet by mouth once daily 90 tablet 0   EPINEPHrine  0.3 mg/0.3 mL IJ SOAJ injection Inject 0.3 mg into the muscle as needed for anaphylaxis. 1 each 2   ezetimibe  (ZETIA ) 10 MG tablet Take 1 tablet (10 mg total) by mouth daily. 90 tablet 0   fluticasone  (FLONASE ) 50 MCG/ACT  nasal spray Place 2 sprays into both nostrils daily. (Patient taking differently: Place 2 sprays into both nostrils daily as needed for allergies.) 9.9 mL 2   gabapentin  (NEURONTIN ) 300 MG capsule Take 1 capsule (300 mg total) by mouth 3 (three) times daily. (Patient taking differently: Take 300 mg by mouth 3 (three) times daily as needed (For pain).) 90 capsule 0   metFORMIN  (GLUCOPHAGE ) 500 MG tablet Take 500 mg by mouth 2 (two) times daily with a meal.     metoprolol  tartrate (LOPRESSOR ) 50 MG tablet Take 1 tablet (50 mg total) by mouth 2 (two) times daily. 180 tablet 3   nitroGLYCERIN  (NITROSTAT ) 0.4 MG SL tablet Place 1 tablet (0.4 mg total) under the tongue every 5 (five) minutes x 3 doses as needed for chest pain. 25 tablet 9   Polyethyl Glycol-Propyl Glycol (SYSTANE) 0.4-0.3 % GEL ophthalmic gel Place 1 Application into both eyes daily as needed (For dry eyes).      Labs  Lab Results:  Admission on 03/29/2024  Component Date Value Ref Range Status   WBC 03/29/2024 7.0  4.0 - 10.5 K/uL Final   RBC 03/29/2024 5.13  4.22 - 5.81 MIL/uL Final   Hemoglobin 03/29/2024 14.3  13.0 - 17.0 g/dL Final   HCT 89/98/7974 43.4  39.0 - 52.0 % Final   MCV 03/29/2024 84.6  80.0 - 100.0 fL Final   MCH 03/29/2024 27.9  26.0 - 34.0 pg Final   MCHC 03/29/2024 32.9  30.0 - 36.0 g/dL Final   RDW 89/98/7974 12.7  11.5 - 15.5 % Final   Platelets 03/29/2024 275  150 - 400 K/uL Final   nRBC 03/29/2024 0.0  0.0 - 0.2 % Final   Neutrophils Relative % 03/29/2024 52  % Final   Neutro Abs 03/29/2024 3.7  1.7 - 7.7 K/uL Final   Lymphocytes Relative 03/29/2024 36  % Final   Lymphs Abs 03/29/2024 2.5  0.7 - 4.0 K/uL Final   Monocytes Relative 03/29/2024 7  % Final   Monocytes Absolute 03/29/2024 0.5  0.1 - 1.0 K/uL Final   Eosinophils Relative 03/29/2024 4  % Final   Eosinophils Absolute 03/29/2024 0.3  0.0 - 0.5 K/uL Final   Basophils Relative 03/29/2024 1  % Final   Basophils Absolute 03/29/2024 0.0  0.0 - 0.1  K/uL Final   Immature Granulocytes 03/29/2024 0  % Final   Abs Immature Granulocytes 03/29/2024 0.03  0.00 - 0.07 K/uL Final   Performed at [facility], [address], [place], [place] [address]   Alcohol , Ethyl (B) 03/29/2024 <15  <15 mg/dL  Final   Comment: (NOTE) For medical purposes only. Performed at [facility], [name], [place], [place] [address]    POC Amphetamine UR 03/29/2024 None Detected  NONE DETECTED (Cut Off Level 1000 ng/mL) Final   POC Secobarbital (BAR) 03/29/2024 None Detected  NONE DETECTED (Cut Off Level 300 ng/mL) Final   POC Buprenorphine (BUP) 03/29/2024 None Detected  NONE DETECTED (Cut Off Level 10 ng/mL) Final   POC Oxazepam (BZO) 03/29/2024 None Detected  NONE DETECTED (Cut Off Level 300 ng/mL) Final   POC Cocaine UR 03/29/2024 None Detected  NONE DETECTED (Cut Off Level 300 ng/mL) Final   POC Methamphetamine UR 03/29/2024 None Detected  NONE DETECTED (Cut Off Level 1000 ng/mL) Final   POC Morphine  03/29/2024 None Detected  NONE DETECTED (Cut Off Level 300 ng/mL) Final   POC Methadone UR 03/29/2024 None Detected  NONE DETECTED (Cut Off Level 300 ng/mL) Final   POC Oxycodone  UR 03/29/2024 None Detected  NONE DETECTED (Cut Off Level 100 ng/mL) Final   POC Marijuana UR 03/29/2024 None Detected  NONE DETECTED (Cut Off Level 50 ng/mL) Final   Glucose-Capillary 03/29/2024 136 (H)  70 - 99 mg/dL Final   Glucose reference range applies only to samples taken after fasting for at least 8 hours.  Office Visit on 03/28/2024  Component Date Value Ref Range Status   Glucose 03/28/2024 137 (H)  70 - 99 mg/dL Final   BUN 90/69/7974 17  8 - 27 mg/dL Final   Creatinine, Ser 03/28/2024 1.02  0.76 - 1.27 mg/dL Final   eGFR 90/69/7974 83  >59 mL/min/1.73 Final   BUN/Creatinine Ratio 03/28/2024 17  10 - 24 Final   Sodium 03/28/2024 141  134 - 144 mmol/L Final   Potassium 03/28/2024 4.5  3.5 - 5.2 mmol/L Final   Chloride 03/28/2024 103  96 - 106 mmol/L Final    CO2 03/28/2024 21  20 - 29 mmol/L Final   Calcium  03/28/2024 9.9  8.6 - 10.2 mg/dL Final   Total Protein 90/69/7974 6.8  6.0 - 8.5 g/dL Final   Albumin 90/69/7974 4.7  3.9 - 4.9 g/dL Final   Globulin, Total 03/28/2024 2.1  1.5 - 4.5 g/dL Final   Bilirubin Total 03/28/2024 0.4  0.0 - 1.2 mg/dL Final   Alkaline Phosphatase 03/28/2024 70  47 - 123 IU/L Final   AST 03/28/2024 20  0 - 40 IU/L Final   ALT 03/28/2024 28  0 - 44 IU/L Final   Cholesterol, Total 03/28/2024 143  100 - 199 mg/dL Final   Triglycerides 90/69/7974 130  0 - 149 mg/dL Final   HDL 90/69/7974 50  >39 mg/dL Final   VLDL Cholesterol Cal 03/28/2024 23  5 - 40 mg/dL Final   LDL Chol Calc (NIH) 03/28/2024 70  0 - 99 mg/dL Final   Chol/HDL Ratio 03/28/2024 2.9  0.0 - 5.0 ratio Final   Comment:                                   T. Chol/HDL Ratio                                             Men  Women  1/2 Avg.Risk  3.4    3.3                                   Avg.Risk  5.0    4.4                                2X Avg.Risk  9.6    7.1                                3X Avg.Risk 23.4   11.0   Admission on 03/14/2024, Discharged on 03/14/2024  Component Date Value Ref Range Status   Color, UA 03/14/2024 orange (A)  yellow Final   Clarity, UA 03/14/2024 clear  clear Final   Glucose, UA 03/14/2024 negative  negative mg/dL Final   Bilirubin, UA 90/83/7974 small (A)  negative Final   Ketones, POC UA 03/14/2024 negative  negative mg/dL Final   Spec Grav, UA 90/83/7974 >=1.030 (A)  1.010 - 1.025 Final   Blood, UA 03/14/2024 negative  negative Final   pH, UA 03/14/2024 5.5  5.0 - 8.0 Final   Protein Ur, POC 03/14/2024 =30 (A)  negative mg/dL Final   Urobilinogen, UA 03/14/2024 0.2  0.2 or 1.0 E.U./dL Final   Nitrite, UA 90/83/7974 Negative  Negative Final   Leukocytes, UA 03/14/2024 Negative  Negative Final   POCT Glucose ([facility]) 03/14/2024 150 (A)  70 - 99 mg/dL Final   Neisseria Gonorrhea 03/14/2024  Negative   Final   Chlamydia 03/14/2024 Negative   Final   Trichomonas 03/14/2024 Negative   Final   Comment 03/14/2024 Normal Reference Ranger Chlamydia - Negative   Final   Comment 03/14/2024 Normal Reference Range Neisseria Gonorrhea - Negative   Final   Comment 03/14/2024 Normal Reference Range Trichomonas - Negative   Final  Admission on 01/25/2024, Discharged on 01/29/2024  Component Date Value Ref Range Status   Glucose-Capillary 01/26/2024 177 (H)  70 - 99 mg/dL Final   Glucose reference range applies only to samples taken after fasting for at least 8 hours.   Glucose-Capillary 01/26/2024 151 (H)  70 - 99 mg/dL Final   Glucose reference range applies only to samples taken after fasting for at least 8 hours.   Glucose-Capillary 01/26/2024 146 (H)  70 - 99 mg/dL Final   Glucose reference range applies only to samples taken after fasting for at least 8 hours.   Glucose-Capillary 01/27/2024 162 (H)  70 - 99 mg/dL Final   Glucose reference range applies only to samples taken after fasting for at least 8 hours.   Glucose-Capillary 01/27/2024 126 (H)  70 - 99 mg/dL Final   Glucose reference range applies only to samples taken after fasting for at least 8 hours.   Glucose-Capillary 01/27/2024 214 (H)  70 - 99 mg/dL Final   Glucose reference range applies only to samples taken after fasting for at least 8 hours.   Glucose-Capillary 01/27/2024 204 (H)  70 - 99 mg/dL Final   Glucose reference range applies only to samples taken after fasting for at least 8 hours.   Glucose-Capillary 01/28/2024 224 (H)  70 - 99 mg/dL Final   Glucose reference range applies only to samples taken after fasting for at least 8 hours.   Glucose-Capillary 01/28/2024 321 (H)  70 -  99 mg/dL Final   Glucose reference range applies only to samples taken after fasting for at least 8 hours.   Glucose-Capillary 01/28/2024 331 (H)  70 - 99 mg/dL Final   Glucose reference range applies only to samples taken after fasting for  at least 8 hours.   Glucose-Capillary 01/29/2024 217 (H)  70 - 99 mg/dL Final   Glucose reference range applies only to samples taken after fasting for at least 8 hours.   Glucose-Capillary 01/28/2024 233 (H)  70 - 99 mg/dL Final   Glucose reference range applies only to samples taken after fasting for at least 8 hours.  Admission on 01/24/2024, Discharged on 01/25/2024  Component Date Value Ref Range Status   WBC 01/24/2024 6.1  4.0 - 10.5 K/uL Final   RBC 01/24/2024 4.92  4.22 - 5.81 MIL/uL Final   Hemoglobin 01/24/2024 13.9  13.0 - 17.0 g/dL Final   HCT 92/71/7974 41.2  39.0 - 52.0 % Final   MCV 01/24/2024 83.7  80.0 - 100.0 fL Final   MCH 01/24/2024 28.3  26.0 - 34.0 pg Final   MCHC 01/24/2024 33.7  30.0 - 36.0 g/dL Final   RDW 92/71/7974 13.2  11.5 - 15.5 % Final   Platelets 01/24/2024 261  150 - 400 K/uL Final   nRBC 01/24/2024 0.0  0.0 - 0.2 % Final   Neutrophils Relative % 01/24/2024 44  % Final   Neutro Abs 01/24/2024 2.7  1.7 - 7.7 K/uL Final   Lymphocytes Relative 01/24/2024 40  % Final   Lymphs Abs 01/24/2024 2.4  0.7 - 4.0 K/uL Final   Monocytes Relative 01/24/2024 7  % Final   Monocytes Absolute 01/24/2024 0.4  0.1 - 1.0 K/uL Final   Eosinophils Relative 01/24/2024 8  % Final   Eosinophils Absolute 01/24/2024 0.5  0.0 - 0.5 K/uL Final   Basophils Relative 01/24/2024 1  % Final   Basophils Absolute 01/24/2024 0.1  0.0 - 0.1 K/uL Final   Immature Granulocytes 01/24/2024 0  % Final   Abs Immature Granulocytes 01/24/2024 0.02  0.00 - 0.07 K/uL Final   Performed at [facility], [address], [place], [place] [address]   Sodium 01/24/2024 136  135 - 145 mmol/L Final   Potassium 01/24/2024 4.1  3.5 - 5.1 mmol/L Final   Chloride 01/24/2024 104  98 - 111 mmol/L Final   CO2 01/24/2024 21 (L)  22 - 32 mmol/L Final   Glucose, Bld 01/24/2024 213 (H)  70 - 99 mg/dL Final   Glucose reference range applies only to samples taken after fasting for at least 8 hours.   BUN  01/24/2024 9  8 - 23 mg/dL Final   Creatinine, Ser 01/24/2024 0.83  0.61 - 1.24 mg/dL Final   Calcium  01/24/2024 9.3  8.9 - 10.3 mg/dL Final   Total Protein 92/71/7974 6.5  6.5 - 8.1 g/dL Final   Albumin 92/71/7974 4.1  3.5 - 5.0 g/dL Final   AST 92/71/7974 23  15 - 41 U/L Final   ALT 01/24/2024 36  0 - 44 U/L Final   Alkaline Phosphatase 01/24/2024 61  38 - 126 U/L Final   Total Bilirubin 01/24/2024 0.7  0.0 - 1.2 mg/dL Final   GFR, Estimated 01/24/2024 >60  >60 mL/min Final   Comment: (NOTE) Calculated using the CKD-EPI Creatinine Equation (2021)    Anion gap 01/24/2024 11  5 - 15 Final   Performed at [facility], [address], [place],  [place] [address]   Alcohol , Ethyl (B) 01/24/2024 <15  <15 mg/dL Final   Comment: (NOTE) For medical purposes only. Performed at [facility], [address], [place], [place] [address]    POC Amphetamine UR 01/24/2024 None Detected  NONE DETECTED (Cut Off Level 1000 ng/mL) Final   POC Secobarbital (BAR) 01/24/2024 None Detected  NONE DETECTED (Cut Off Level 300 ng/mL) Final   POC Buprenorphine (BUP) 01/24/2024 None Detected  NONE DETECTED (Cut Off Level 10 ng/mL) Final   POC Oxazepam (BZO) 01/24/2024 None Detected  NONE DETECTED (Cut Off Level 300 ng/mL) Final   POC Cocaine UR 01/24/2024 None Detected  NONE DETECTED (Cut Off Level 300 ng/mL) Final   POC Methamphetamine UR 01/24/2024 None Detected  NONE DETECTED (Cut Off Level 1000 ng/mL) Final   POC Morphine  01/24/2024 None Detected  NONE DETECTED (Cut Off Level 300 ng/mL) Final   POC Methadone UR 01/24/2024 None Detected  NONE DETECTED (Cut Off Level 300 ng/mL) Final   POC Oxycodone  UR 01/24/2024 None Detected  NONE DETECTED (Cut Off Level 100 ng/mL) Final   POC Marijuana UR 01/24/2024 None Detected  NONE DETECTED (Cut Off Level 50 ng/mL) Final   Glucose-Capillary 01/24/2024 217 (H)  70 - 99 mg/dL Final   Glucose reference range applies only to samples taken after fasting for at  least 8 hours.   Glucose-Capillary 01/25/2024 150 (H)  70 - 99 mg/dL Final   Glucose reference range applies only to samples taken after fasting for at least 8 hours.  Office Visit on 12/23/2023  Component Date Value Ref Range Status   Creatinine, Urine 12/23/2023 225.4  Not Estab. mg/dL Final   Microalbumin, Urine 12/23/2023 20.3  Not Estab. ug/mL Final   Microalb/Creat Ratio 12/23/2023 9  0 - 29 mg/g creat Final   Comment:                        Normal:                0 -  29                        Moderately increased: 30 - 300                        Severely increased:       >300    Hemoglobin A1C 12/23/2023 7.2 (A)  4.0 - 5.6 % Final   WBC 12/23/2023 5.4  3.4 - 10.8 x10E3/uL Final   RBC 12/23/2023 5.57  4.14 - 5.80 x10E6/uL Final   Hemoglobin 12/23/2023 15.4  13.0 - 17.7 g/dL Final   Hematocrit 93/73/7974 48.4  37.5 - 51.0 % Final   MCV 12/23/2023 87  79 - 97 fL Final   MCH 12/23/2023 27.6  26.6 - 33.0 pg Final   MCHC 12/23/2023 31.8  31.5 - 35.7 g/dL Final   RDW 93/73/7974 13.0  11.6 - 15.4 % Final   Platelets 12/23/2023 248  150 - 450 x10E3/uL Final   Glucose 12/23/2023 140 (H)  70 - 99 mg/dL Final   BUN 93/73/7974 20  8 - 27 mg/dL Final   Creatinine, Ser 12/23/2023 1.06  0.76 - 1.27 mg/dL Final   eGFR 93/73/7974 79  >59 mL/min/1.73 Final   BUN/Creatinine Ratio 12/23/2023 19  10 - 24 Final   Sodium 12/23/2023 137  134 - 144 mmol/L Final   Potassium 12/23/2023 4.7  3.5 - 5.2 mmol/L Final   Chloride 12/23/2023 101  96 - 106 mmol/L Final   CO2 12/23/2023 18 (L)  20 - 29 mmol/L Final   Calcium  12/23/2023 10.3 (H)  8.6 - 10.2 mg/dL Final   Total Protein 93/73/7974 7.2  6.0 - 8.5 g/dL Final   Albumin 93/73/7974 4.9  3.9 - 4.9 g/dL Final   Globulin, Total 12/23/2023 2.3  1.5 - 4.5 g/dL Final   Bilirubin Total 12/23/2023 0.7  0.0 - 1.2 mg/dL Final   Alkaline Phosphatase 12/23/2023 84  44 - 121 IU/L Final   AST 12/23/2023 50 (H)  0 - 40 IU/L Final   ALT 12/23/2023 61 (H)  0 - 44  IU/L Final   Cholesterol, Total 12/23/2023 156  100 - 199 mg/dL Final   Triglycerides 93/73/7974 109  0 - 149 mg/dL Final   HDL 93/73/7974 40  >39 mg/dL Final   VLDL Cholesterol Cal 12/23/2023 20  5 - 40 mg/dL Final   LDL Chol Calc (NIH) 12/23/2023 96  0 - 99 mg/dL Final   Chol/HDL Ratio 12/23/2023 3.9  0.0 - 5.0 ratio Final   Comment:                                   T. Chol/HDL Ratio                                             Men  Women                               1/2 Avg.Risk  3.4    3.3                                   Avg.Risk  5.0    4.4                                2X Avg.Risk  9.6    7.1                                3X Avg.Risk 23.4   11.0    Prostate Specific Ag, Serum 12/23/2023 1.0  0.0 - 4.0 ng/mL Final   Comment: Roche ECLIA methodology. According to the American U[employer] Association, Serum PSA should decrease and remain at undetectable levels after radical prostatectomy. The AUA defines biochemical recurrence as an initial PSA value 0.2 ng/mL or greater followed by a subsequent confirmatory PSA value 0.2 ng/mL or greater. Values obtained with different assay methods or kits cannot be used interchangeably. Results cannot be interpreted as absolute evidence of the presence or absence of malignant disease.     Blood Alcohol  level:  Lab Results  Component Value Date   [facility] <15 03/29/2024   ETH <15 01/24/2024    Metabolic Disorder Labs: Lab Results  Component Value Date   HGBA1C 7.2 (A) 12/23/2023   MPG 159.94 02/15/2023   MPG 142.72 08/07/2022   Lab Results  Component Value Date   PROLACTIN 13.0 03/17/2020   Lab Results  Component Value  Date   CHOL 143 03/28/2024   TRIG 130 03/28/2024   HDL 50 03/28/2024   CHOLHDL 2.9 03/28/2024   VLDL 44 (H) 02/15/2023   LDLCALC 70 03/28/2024   LDLCALC 96 12/23/2023    Therapeutic Lab Levels: No results found for: LITHIUM No results found for: VALPROATE No results found for: CBMZ  Physical  Findings   AIMS    Flowsheet Row Ad[facility] (Discharged) [facility] (Dis[facility] from [facility] Ad[facility] (Discharged) from [facility] Ad[facility] (Discharged) from [facility] (Dis[facility]) [facility]  AIMS Total Score 0 0 0 0 0   AUDIT    Flowsheet Row Office Visit from 11/16/2022 in [facility] Of [facility] Office Visit from 08/27/2020 in [facility] Admission (Discharged) from [facility] (Discharged[facility] (Discharged[facility] from [facility]  Alcohol  Use Disorder Identification Test Final Score (AUDIT) 24 27 28 13 25    GAD-7    Flowsheet Row Office Visit from 08/27/2023 in [facility] - A Dept Of [facility] Counselor from 05/17/2023 in [facility] Office Visit from 02/15/2023 in [facility] - A Dept Of [facility] Clinical Support from 02/24/2022 in [facility] Office Visit from 01/08/2022 in [facility] - A Dept Of [facility]  Total GAD-7 Score 0 12 10 2 12    PHQ2-9    Flowsheet Row ED from 03/29/2024 in [facility] ED from 01/25/2024 in [facility] ED from 01/24/2024 in [facility] Office Visit from 12/23/2023 in [facility] - A Dept Of [facility] Office Visit from 08/27/2023 in [facility] - A Dept Of [facility]  PHQ-2 Total Score 4 0 0 0 0  PHQ-9 Total Score 14 -- -- 3 0   Flowsheet Row ED from 03/29/2024 in [facility] Most recent reading at 03/29/2024  5:40 AM ED from 03/29/2024 in [facility] Most recent reading at 03/28/2024  8:43 PM UC from 03/14/2024 in [facility] [place] Most recent reading at 03/14/2024  9:21 AM  C-SSRS RISK CATEGORY No Risk No Risk No Risk     Musculoskeletal  Strength & Muscle Tone: within normal limits Gait & Station: normal Patient leans: N/A  Psychiatric Specialty Exam  Presentation  General Appearance:  Casual  Eye Contact: Good  Speech: Clear and Coherent  Speech Volume: Normal  Handedness: Right   Mood and Affect  Mood: Depressed  Affect: Congruent; Flat   Thought Process  Thought Processes: Coherent; Goal Directed  Descriptions of Associations:Intact  Orientation:Full (Time, Place and Person)  Thought Content:WDL  Diagnosis of Schizophrenia or Schizoaffective disorder in past: No    Hallucinations:Hallucinations: None  Ideas of Reference:None  Suicidal Thoughts:Suicidal Thoughts: No  Homicidal Thoughts:Homicidal Thoughts: No  Sensorium  Memory: Immediate Good  Judgment: Intact  Insight: Present   Executive Functions  Concentration: Good  Attention Span: Good  Recall: Good  Fund of Knowledge: Good  Language: Good   Psychomotor Activity  Psychomotor Activity: Psychomotor Activity: Normal   Assets  Assets: Communication Skills; Desire for Improvement; Social Support   Sleep  Sleep: Sleep: Poor  Estimated Sleeping Duration (Last 24 Hours): 8.25-9.00 hours  Nutritional Assessment (For OBS and [facility] admissions only) Has the patient had a weight loss or gain of 10 pounds or more in the last 3 months?:  No Has the patient had a decrease in food intake/or appetite?: No Does the patient have dental problems?: No Does the patient have eating habits or behaviors that may be indicators of an eating disorder including binging or inducing vomiting?: No Has the patient recently lost weight without trying?: 0 Has the patient been eating poorly because of a decreased appetite?: 0 Malnutrition Screening Tool Score: 0    Physical Exam  Physical Exam Constitutional:      General: He is not in acute distress.    Appearance: Normal appearance. He is not ill-appearing.  HENT:     Head: Normocephalic and atraumatic.     Nose: Nose normal.  Eyes:     Extraocular Movements: Extraocular movements intact.     Conjunctiva/sclera: Conjunctivae normal.     Pupils: Pupils are equal, round, and reactive to light.  Cardiovascular:     Rate and Rhythm: Normal rate and regular rhythm.  Pulmonary:     Effort: Pulmonary effort is normal.     Breath sounds: Normal breath sounds.  Musculoskeletal:     Cervical back: Normal range of motion and neck supple.  Skin:    General: Skin is warm and dry.  Neurological:     General: No focal deficit present.     Mental Status: He is alert and oriented to person, place, and time.    Review of Systems  Neurological:  Positive for tremors (mild).  Psychiatric/Behavioral:  Positive for depression and substance abuse (ETOH). Negative for hallucinations, memory loss and suicidal ideas. The patient is not nervous/anxious and does not have insomnia.     Blood pressure (!) 141/103, pulse 74, temperature 98.8 F (37.1 C), temperature source Oral, resp. rate 16, SpO2 99%. There is no height or weight on file to calculate BMI.  Treatment Plan Summary: Daily contact with patient to assess and evaluate symptoms and progress in treatment, Medication management, and Plan : Patient continues to exhibit minimal symptoms of alcohol  withdrawal.  Continue to monitor for 24 hours and  patient will be discharged.  Patient declines any outpatient residential treatment therefore he will continue follow-up at [facility] services other [place]   1. Alcohol  use disorder, severe, dependence ([name]) (Primary), continue CIWA protocol and Librium  taper, continue management any symptoms concerning for complicated withdrawal.  Patient has a history of complicated withdrawal.  2. Moderate episode of recurrent major depressive disorder ([name]), stable on evaluation this morning patient is denying any active thoughts of self-harm and endorses an improved mood.  3. Type 2 diabetes mellitus with hyperglycemia, with long-term current use of insulin  ([name]), continue glucose checks at mealtime and at bedtime continue home medication with metformin .  4. CAD with Hypertension, history of MI patient currently is prescribed Plavix  which has been continued this admission and antihypertensive treatment.  Blood pressure has remained stable.  Continue metoprolol  and Norvasc     Anticipated discharge: 04/01/2024  Suzen Lesches, NP  03/30/2024 12:02 AM

## 2024-03-30 NOTE — Group Note (Signed)
 Group Topic: Wellness  Group Date: 03/30/2024 Start Time: 1030 End Time: 1100 Facilitators: Carletha Iha, RN  Department: [facility]  Number of Participants: 7  Group Focus: nursing group Medication Management Treatment Modality:  Psychoeducation Interventions utilized were patient education Purpose: increase insight  Name: [name] Date of Birth: [birth year]  MR: [id number]    Level of Participation: active Quality of Participation: attentive Interactions with others: gave feedback Mood/Affect: appropriate Triggers (if applicable):  Cognition: coherent/clear Progress: Gaining insight Response:  Plan: patient will be encouraged to take medication at discharte  Patients Problems:  Patient Active Problem List   Diagnosis Date Noted   Alcohol  use disorder, severe, dependence ([name]) 01/25/2024   Scrotal pain, right 08/26/2023   Epididymitis, right 08/26/2023   Complex renal cyst, right, 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/26/2023   Impingement syndrome of right shoulder 07/01/2023   Pain in right shoulder 06/16/2023   Anxiety disorder, unspecified 02/16/2023   Alcohol  use disorder 02/16/2023   Sleep disorder, unspecified 02/16/2023   Alcohol  abuse 02/15/2023   Alcohol  dependence ([name]) 05/16/2022   Low energy 03/06/2022   Moderate alcohol  dependence in early remission ([name]) 02/24/2022   Suicidal ideation 07/07/2021   Generalized anxiety disorder 07/07/2021   Other allergic rhinitis 09/09/2020   Adverse reaction to food, subsequent encounter 09/09/2020   History of bee sting allergy 09/09/2020   Alcohol  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) 03/19/2020   MDD (major depressive disorder), recurrent episode, severe ([name]) 03/18/2020   History of substance abuse ([name]) 01/04/2020   Severe 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 features ([name]) 12/10/2019   Tinea cruris 07/06/2019   Hemoglobin A1c less than 7.0% 07/06/2019   Muscle strain of wrist  04/05/2019   Pain in both wrists 04/05/2019   Anxiety 04/05/2019   Prediabetes 04/05/2019   MDD (major depressive disorder) 01/12/2019   Chest pain 10/25/2018   MDD (major depressive disorder), recurrent severe, without psychosis ([name]) 08/15/2018   Influenza A 07/17/2017   Lactic acidosis    Cough    Nausea and vomiting    Severe episode of recurrent major depressive disorder, without psychotic features ([name])    MDD (major depressive disorder), single episode, severe with psychotic features ([name]) 06/27/2017   MDD (major depressive disorder), severe ([name]) 06/23/2017   Essential hypertension 01/13/2016   Unstable angina ([name])    ED (erectile dysfunction) 02/15/2014   Atypical chest pain 12/14/2011   Allergy to contrast media (used for diagnostic x-rays) 05/12/2011   Dyslipidemia, goal LDL below 70 05/09/2011   DM2 (diabetes mellitus, type 2) ([name]) 07/10/2010   CAD (coronary artery disease) 07/10/2010

## 2024-03-30 NOTE — ED Notes (Signed)
 Pt asked when his librium  taper would end.  Informed patient it is scheduled to end Saturday 10/4 at 1000.  Pt stated I won't be here then, I am discharging on Friday.  Encouraged patient to speak with provider regarding this issue.  Pt verbalized understanding

## 2024-03-30 NOTE — ED Notes (Signed)
 Pt presents in dayroom.  Calm and subdued.  No needs identified at present. Q 15 minute observations in place for safety

## 2024-03-30 NOTE — ED Notes (Signed)
 Pt has been calm and cooperative this shift.  Pt will be able to discharge tomorrow and reports needing a cab voucher to get home Librium  taper is going well/ Q 15 minute observations for safety continue

## 2024-03-30 NOTE — ED Provider Notes (Incomplete)
 Behavioral Health Progress Note  Date and Time: 03/30/2024 12:02 AM Name: [name] MRN:  [id number]  Subjective:  ***  Diagnosis:  Final diagnoses:  Alcohol  use disorder, severe, dependence ([name])  Substance abuse ([name])    Total Time spent with patient: {Time; 15 min - 8 hours:17441}    Patient seen while rounding on other patients and requested diabetic friendly meal options. Counseled patient     Additional Social History:                         Sleep: {BHH GOOD/FAIR/POOR:22877}  Appetite:  {BHH GOOD/FAIR/POOR:22877}  Current Medications:  Current Facility-Administered Medications  Medication Dose Route Frequency Provider Last Rate Last Admin  . acetaminophen  (TYLENOL ) tablet 650 mg  650 mg Oral Q6H PRN Onuoha, Chinwendu V, NP      . alum & mag hydroxide-simeth (MAALOX/MYLANTA) 200-200-20 MG/5ML suspension 30 mL  30 mL Oral Q4H PRN Onuoha, Chinwendu V, NP      . amLODipine  (NORVASC ) tablet 5 mg  5 mg Oral Daily Onuoha, Chinwendu V, NP   5 mg at 03/29/24 0834  . aspirin  EC tablet 81 mg  81 mg Oral Daily Onuoha, Chinwendu V, NP   81 mg at 03/29/24 0910  . chlordiazePOXIDE  (LIBRIUM ) capsule 25 mg  25 mg Oral Q6H PRN Onuoha, Chinwendu V, NP      . chlordiazePOXIDE  (LIBRIUM ) capsule 25 mg  25 mg Oral TID Onuoha, Chinwendu V, NP       Followed by  . [START ON 03/31/2024] chlordiazePOXIDE  (LIBRIUM ) capsule 25 mg  25 mg Oral BH-qamhs Onuoha, Chinwendu V, NP       Followed by  . [START ON 04/01/2024] chlordiazePOXIDE  (LIBRIUM ) capsule 25 mg  25 mg Oral Daily Onuoha, Chinwendu V, NP      . clopidogrel  (PLAVIX ) tablet 75 mg  75 mg Oral Daily Onuoha, Chinwendu V, NP   75 mg at 03/29/24 0911  . haloperidol  (HALDOL ) tablet 5 mg  5 mg Oral TID PRN Onuoha, Chinwendu V, NP       And  . diphenhydrAMINE  (BENADRYL ) capsule 50 mg  50 mg Oral TID PRN Onuoha, Chinwendu V, NP      . haloperidol  lactate (HALDOL ) injection 5 mg  5 mg Intramuscular TID PRN Onuoha, Chinwendu V, NP        And  . diphenhydrAMINE  (BENADRYL ) injection 50 mg  50 mg Intramuscular TID PRN Onuoha, Chinwendu V, NP       And  . LORazepam  (ATIVAN ) injection 2 mg  2 mg Intramuscular TID PRN Onuoha, Chinwendu V, NP      . haloperidol  lactate (HALDOL ) injection 10 mg  10 mg Intramuscular TID PRN Onuoha, Chinwendu V, NP       And  . diphenhydrAMINE  (BENADRYL ) injection 50 mg  50 mg Intramuscular TID PRN Onuoha, Chinwendu V, NP       And  . LORazepam  (ATIVAN ) injection 2 mg  2 mg Intramuscular TID PRN Onuoha, Chinwendu V, NP      . ezetimibe  (ZETIA ) tablet 10 mg  10 mg Oral Daily Onuoha, Chinwendu V, NP   10 mg at 03/29/24 0910  . feeding supplement (GLUCERNA SHAKE) (GLUCERNA SHAKE) liquid 237 mL  237 mL Oral TID Arloa Suzen RAMAN, NP   237 mL at 03/29/24 2139  . gabapentin  (NEURONTIN ) capsule 300 mg  300 mg Oral TID Onuoha, Chinwendu V, NP   300 mg at 03/29/24 2138  . hydrOXYzine  (ATARAX )  tablet 25 mg  25 mg Oral Q6H PRN Onuoha, Chinwendu V, NP      . loperamide  (IMODIUM ) capsule 2-4 mg  2-4 mg Oral PRN Onuoha, Chinwendu V, NP      . magnesium  hydroxide (MILK OF MAGNESIA) suspension 30 mL  30 mL Oral Daily PRN Onuoha, Chinwendu V, NP   30 mL at 03/29/24 1333  . metFORMIN  (GLUCOPHAGE ) tablet 500 mg  500 mg Oral BID WC Onuoha, Chinwendu V, NP   500 mg at 03/29/24 1709  . metoprolol  tartrate (LOPRESSOR ) tablet 50 mg  50 mg Oral BID Onuoha, Chinwendu V, NP   50 mg at 03/29/24 2138  . multivitamin with minerals tablet 1 tablet  1 tablet Oral Daily Onuoha, Chinwendu V, NP   1 tablet at 03/29/24 0910  . ondansetron  (ZOFRAN -ODT) disintegrating tablet 4 mg  4 mg Oral Q6H PRN Onuoha, Chinwendu V, NP       Current Outpatient Medications  Medication Sig Dispense Refill  . ACCU-CHEK GUIDE TEST test strip USE 1 STRIP TO CHECK GLUCOSE THREE TIMES DAILY AS DIRECTED 300 each 0  . amLODipine  (NORVASC ) 10 MG tablet Take 1 tablet by mouth once daily 90 tablet 0  . aspirin  EC 81 MG tablet Take 81 mg by mouth daily.    .  Cholecalciferol  25 MCG (1000 UT) tablet Take 1,000 Units by mouth daily.    . clopidogrel  (PLAVIX ) 75 MG tablet Take 1 tablet by mouth once daily 90 tablet 0  . EPINEPHrine  0.3 mg/0.3 mL IJ SOAJ injection Inject 0.3 mg into the muscle as needed for anaphylaxis. 1 each 2  . ezetimibe  (ZETIA ) 10 MG tablet Take 1 tablet (10 mg total) by mouth daily. 90 tablet 0  . fluticasone  (FLONASE ) 50 MCG/ACT nasal spray Place 2 sprays into both nostrils daily. (Patient taking differently: Place 2 sprays into both nostrils daily as needed for allergies.) 9.9 mL 2  . gabapentin  (NEURONTIN ) 300 MG capsule Take 1 capsule (300 mg total) by mouth 3 (three) times daily. (Patient taking differently: Take 300 mg by mouth 3 (three) times daily as needed (For pain).) 90 capsule 0  . metFORMIN  (GLUCOPHAGE ) 500 MG tablet Take 500 mg by mouth 2 (two) times daily with a meal.    . metoprolol  tartrate (LOPRESSOR ) 50 MG tablet Take 1 tablet (50 mg total) by mouth 2 (two) times daily. 180 tablet 3  . nitroGLYCERIN  (NITROSTAT ) 0.4 MG SL tablet Place 1 tablet (0.4 mg total) under the tongue every 5 (five) minutes x 3 doses as needed for chest pain. 25 tablet 9  . Polyethyl Glycol-Propyl Glycol (SYSTANE) 0.4-0.3 % GEL ophthalmic gel Place 1 Application into both eyes daily as needed (For dry eyes).      Labs  Lab Results:  Admission on 03/29/2024  Component Date Value Ref Range Status  . WBC 03/29/2024 7.0  4.0 - 10.5 K/uL Final  . RBC 03/29/2024 5.13  4.22 - 5.81 MIL/uL Final  . Hemoglobin 03/29/2024 14.3  13.0 - 17.0 g/dL Final  . HCT 89/98/7974 43.4  39.0 - 52.0 % Final  . MCV 03/29/2024 84.6  80.0 - 100.0 fL Final  . MCH 03/29/2024 27.9  26.0 - 34.0 pg Final  . MCHC 03/29/2024 32.9  30.0 - 36.0 g/dL Final  . RDW 89/98/7974 12.7  11.5 - 15.5 % Final  . Platelets 03/29/2024 275  150 - 400 K/uL Final  . nRBC 03/29/2024 0.0  0.0 - 0.2 % Final  . Neutrophils Relative %  03/29/2024 52  % Final  . Neutro Abs 03/29/2024 3.7  1.7  - 7.7 K/uL Final  . Lymphocytes Relative 03/29/2024 36  % Final  . Lymphs Abs 03/29/2024 2.5  0.7 - 4.0 K/uL Final  . Monocytes Relative 03/29/2024 7  % Final  . Monocytes Absolute 03/29/2024 0.5  0.1 - 1.0 K/uL Final  . Eosinophils Relative 03/29/2024 4  % Final  . Eosinophils Absolute 03/29/2024 0.3  0.0 - 0.5 K/uL Final  . Basophils Relative 03/29/2024 1  % Final  . Basophils Absolute 03/29/2024 0.0  0.0 - 0.1 K/uL Final  . Immature Granulocytes 03/29/2024 0  % Final  . Abs Immature Granulocytes 03/29/2024 0.03  0.00 - 0.07 K/uL Final   Performed at [facility], [address], [place], [place] [address]  . Alcohol , Ethyl (B) 03/29/2024 <15  <15 mg/dL Final   Comment: (NOTE) For medical purposes only. Performed at [facility], [address] [place], [place] [address]   . POC Amphetamine UR 03/29/2024 None Detected  NONE DETECTED (Cut Off Level 1000 ng/mL) Final  . POC Secobarbital (BAR) 03/29/2024 None Detected  NONE DETECTED (Cut Off Level 300 ng/mL) Final  . POC Buprenorphine (BUP) 03/29/2024 None Detected  NONE DETECTED (Cut Off Level 10 ng/mL) Final  . POC Oxazepam (BZO) 03/29/2024 None Detected  NONE DETECTED (Cut Off Level 300 ng/mL) Final  . POC Cocaine UR 03/29/2024 None Detected  NONE DETECTED (Cut Off Level 300 ng/mL) Final  . POC Methamphetamine UR 03/29/2024 None Detected  NONE DETECTED (Cut Off Level 1000 ng/mL) Final  . POC Morphine  03/29/2024 None Detected  NONE DETECTED (Cut Off Level 300 ng/mL) Final  . POC Methadone UR 03/29/2024 None Detected  NONE DETECTED (Cut Off Level 300 ng/mL) Final  . POC Oxycodone  UR 03/29/2024 None Detected  NONE DETECTED (Cut Off Level 100 ng/mL) Final  . POC Marijuana UR 03/29/2024 None Detected  NONE DETECTED (Cut Off Level 50 ng/mL) Final  . Glucose-Capillary 03/29/2024 136 (H)  70 - 99 mg/dL Final   Glucose reference range applies only to samples taken after fasting for at least 8 hours.  Office Visit on 03/28/2024   Component Date Value Ref Range Status  . Glucose 03/28/2024 137 (H)  70 - 99 mg/dL Final  . BUN 90/69/7974 17  8 - 27 mg/dL Final  . Creatinine, Ser 03/28/2024 1.02  0.76 - 1.27 mg/dL Final  . eGFR 90/69/7974 83  >59 mL/min/1.73 Final  . BUN/Creatinine Ratio 03/28/2024 17  10 - 24 Final  . Sodium 03/28/2024 141  134 - 144 mmol/L Final  . Potassium 03/28/2024 4.5  3.5 - 5.2 mmol/L Final  . Chloride 03/28/2024 103  96 - 106 mmol/L Final  . CO2 03/28/2024 21  20 - 29 mmol/L Final  . Calcium  03/28/2024 9.9  8.6 - 10.2 mg/dL Final  . Total Protein 03/28/2024 6.8  6.0 - 8.5 g/dL Final  . Albumin 90/69/7974 4.7  3.9 - 4.9 g/dL Final  . Globulin, Total 03/28/2024 2.1  1.5 - 4.5 g/dL Final  . Bilirubin Total 03/28/2024 0.4  0.0 - 1.2 mg/dL Final  . Alkaline Phosphatase 03/28/2024 70  47 - 123 IU/L Final  . AST 03/28/2024 20  0 - 40 IU/L Final  . ALT 03/28/2024 28  0 - 44 IU/L Final  . Cholesterol, Total 03/28/2024 143  100 - 199 mg/dL Final  . Triglycerides 03/28/2024 130  0 - 149 mg/dL Final  . HDL  03/28/2024 50  >39 mg/dL Final  . VLDL Cholesterol Cal 03/28/2024 23  5 - 40 mg/dL Final  . LDL Chol Calc (NIH) 03/28/2024 70  0 - 99 mg/dL Final  . Chol/HDL Ratio 03/28/2024 2.9  0.0 - 5.0 ratio Final   Comment:                                   T. Chol/HDL Ratio                                             Men  Women                               1/2 Avg.Risk  3.4    3.3                                   Avg.Risk  5.0    4.4                                2X Avg.Risk  9.6    7.1                                3X Avg.Risk 23.4   11.0   Admission on 03/14/2024, Discharged on 03/14/2024  Component Date Value Ref Range Status  . Color, UA 03/14/2024 orange (A)  yellow Final  . Clarity, UA 03/14/2024 clear  clear Final  . Glucose, UA 03/14/2024 negative  negative mg/dL Final  . Bilirubin, UA 03/14/2024 small (A)  negative Final  . Ketones, POC UA 03/14/2024 negative  negative mg/dL Final  .  Spec Grav, UA 03/14/2024 >=1.030 (A)  1.010 - 1.025 Final  . Blood, UA 03/14/2024 negative  negative Final  . pH, UA 03/14/2024 5.5  5.0 - 8.0 Final  . Protein Ur, POC 03/14/2024 =30 (A)  negative mg/dL Final  . Urobilinogen, UA 03/14/2024 0.2  0.2 or 1.0 E.U./dL Final  . Nitrite, UA 90/83/7974 Negative  Negative Final  . Leukocytes, UA 03/14/2024 Negative  Negative Final  . POCT Glucose ([facility]) 03/14/2024 150 (A)  70 - 99 mg/dL Final  . Neisseria Gonorrhea 03/14/2024 Negative   Final  . Chlamydia 03/14/2024 Negative   Final  . Trichomonas 03/14/2024 Negative   Final  . Comment 03/14/2024 Normal Reference Ranger Chlamydia - Negative   Final  . Comment 03/14/2024 Normal Reference Range Neisseria Gonorrhea - Negative   Final  . Comment 03/14/2024 Normal Reference Range Trichomonas - Negative   Final  Admission on 01/25/2024, Discharged on 01/29/2024  Component Date Value Ref Range Status  . Glucose-Capillary 01/26/2024 177 (H)  70 - 99 mg/dL Final   Glucose reference range applies only to samples taken after fasting for at least 8 hours.  . Glucose-Capillary 01/26/2024 151 (H)  70 - 99 mg/dL Final   Glucose reference range applies only to samples taken after fasting for at least 8 hours.  . Glucose-Capillary 01/26/2024 146 (H)  70 - 99 mg/dL Final   Glucose reference range applies only to samples  taken after fasting for at least 8 hours.  . Glucose-Capillary 01/27/2024 162 (H)  70 - 99 mg/dL Final   Glucose reference range applies only to samples taken after fasting for at least 8 hours.  . Glucose-Capillary 01/27/2024 126 (H)  70 - 99 mg/dL Final   Glucose reference range applies only to samples taken after fasting for at least 8 hours.  . Glucose-Capillary 01/27/2024 214 (H)  70 - 99 mg/dL Final   Glucose reference range applies only to samples taken after fasting for at least 8 hours.  . Glucose-Capillary 01/27/2024 204 (H)  70 - 99 mg/dL Final   Glucose reference range applies only to  samples taken after fasting for at least 8 hours.  . Glucose-Capillary 01/28/2024 224 (H)  70 - 99 mg/dL Final   Glucose reference range applies only to samples taken after fasting for at least 8 hours.  . Glucose-Capillary 01/28/2024 321 (H)  70 - 99 mg/dL Final   Glucose reference range applies only to samples taken after fasting for at least 8 hours.  . Glucose-Capillary 01/28/2024 331 (H)  70 - 99 mg/dL Final   Glucose reference range applies only to samples taken after fasting for at least 8 hours.  . Glucose-Capillary 01/29/2024 217 (H)  70 - 99 mg/dL Final   Glucose reference range applies only to samples taken after fasting for at least 8 hours.  . Glucose-Capillary 01/28/2024 233 (H)  70 - 99 mg/dL Final   Glucose reference range applies only to samples taken after fasting for at least 8 hours.  Admission on 01/24/2024, Discharged on 01/25/2024  Component Date Value Ref Range Status  . WBC 01/24/2024 6.1  4.0 - 10.5 K/uL Final  . RBC 01/24/2024 4.92  4.22 - 5.81 MIL/uL Final  . Hemoglobin 01/24/2024 13.9  13.0 - 17.0 g/dL Final  . HCT 92/71/7974 41.2  39.0 - 52.0 % Final  . MCV 01/24/2024 83.7  80.0 - 100.0 fL Final  . MCH 01/24/2024 28.3  26.0 - 34.0 pg Final  . MCHC 01/24/2024 33.7  30.0 - 36.0 g/dL Final  . RDW 92/71/7974 13.2  11.5 - 15.5 % Final  . Platelets 01/24/2024 261  150 - 400 K/uL Final  . nRBC 01/24/2024 0.0  0.0 - 0.2 % Final  . Neutrophils Relative % 01/24/2024 44  % Final  . Neutro Abs 01/24/2024 2.7  1.7 - 7.7 K/uL Final  . Lymphocytes Relative 01/24/2024 40  % Final  . Lymphs Abs 01/24/2024 2.4  0.7 - 4.0 K/uL Final  . Monocytes Relative 01/24/2024 7  % Final  . Monocytes Absolute 01/24/2024 0.4  0.1 - 1.0 K/uL Final  . Eosinophils Relative 01/24/2024 8  % Final  . Eosinophils Absolute 01/24/2024 0.5  0.0 - 0.5 K/uL Final  . Basophils Relative 01/24/2024 1  % Final  . Basophils Absolute 01/24/2024 0.1  0.0 - 0.1 K/uL Final  . Immature Granulocytes  01/24/2024 0  % Final  . Abs Immature Granulocytes 01/24/2024 0.02  0.00 - 0.07 K/uL Final   Performed at [facility], [address], [place], [place] [address]  . Sodium 01/24/2024 136  135 - 145 mmol/L Final  . Potassium 01/24/2024 4.1  3.5 - 5.1 mmol/L Final  . Chloride 01/24/2024 104  98 - 111 mmol/L Final  . CO2 01/24/2024 21 (L)  22 - 32 mmol/L Final  . Glucose, Bld 01/24/2024 213 (H)  70 - 99 mg/dL Final   Glucose reference  range applies only to samples taken after fasting for at least 8 hours.  . BUN 01/24/2024 9  8 - 23 mg/dL Final  . Creatinine, Ser 01/24/2024 0.83  0.61 - 1.24 mg/dL Final  . Calcium  01/24/2024 9.3  8.9 - 10.3 mg/dL Final  . Total Protein 01/24/2024 6.5  6.5 - 8.1 g/dL Final  . Albumin 92/71/7974 4.1  3.5 - 5.0 g/dL Final  . AST 92/71/7974 23  15 - 41 U/L Final  . ALT 01/24/2024 36  0 - 44 U/L Final  . Alkaline Phosphatase 01/24/2024 61  38 - 126 U/L Final  . Total Bilirubin 01/24/2024 0.7  0.0 - 1.2 mg/dL Final  . GFR, Estimated 01/24/2024 >60  >60 mL/min Final   Comment: (NOTE) Calculated using the CKD-EPI Creatinine Equation (2021)   . Anion gap 01/24/2024 11  5 - 15 Final   Performed at [facility], [address], [place], [place] [address]  . Alcohol , Ethyl (B) 01/24/2024 <15  <15 mg/dL Final   Comment: (NOTE) For medical purposes only. Performed at [facility], [address], [place], [place] [address]   . POC Amphetamine UR 01/24/2024 None Detected  NONE DETECTED (Cut Off Level 1000 ng/mL) Final  . POC Secobarbital (BAR) 01/24/2024 None Detected  NONE DETECTED (Cut Off Level 300 ng/mL) Final  . POC Buprenorphine (BUP) 01/24/2024 None Detected  NONE DETECTED (Cut Off Level 10 ng/mL) Final  . POC Oxazepam (BZO) 01/24/2024 None Detected  NONE DETECTED (Cut Off Level 300 ng/mL) Final  . POC Cocaine UR 01/24/2024 None Detected  NONE DETECTED (Cut Off Level 300 ng/mL) Final  . POC Methamphetamine UR 01/24/2024 None Detected  NONE  DETECTED (Cut Off Level 1000 ng/mL) Final  . POC Morphine  01/24/2024 None Detected  NONE DETECTED (Cut Off Level 300 ng/mL) Final  . POC Methadone UR 01/24/2024 None Detected  NONE DETECTED (Cut Off Level 300 ng/mL) Final  . POC Oxycodone  UR 01/24/2024 None Detected  NONE DETECTED (Cut Off Level 100 ng/mL) Final  . POC Marijuana UR 01/24/2024 None Detected  NONE DETECTED (Cut Off Level 50 ng/mL) Final  . Glucose-Capillary 01/24/2024 217 (H)  70 - 99 mg/dL Final   Glucose reference range applies only to samples taken after fasting for at least 8 hours.  . Glucose-Capillary 01/25/2024 150 (H)  70 - 99 mg/dL Final   Glucose reference range applies only to samples taken after fasting for at least 8 hours.  Office Visit on 12/23/2023  Component Date Value Ref Range Status  . Creatinine, Urine 12/23/2023 225.4  Not Estab. mg/dL Final  . Microalbumin, Urine 12/23/2023 20.3  Not Estab. ug/mL Final  . Microalb/Creat Ratio 12/23/2023 9  0 - 29 mg/g creat Final   Comment:                        Normal:                0 -  29                        Moderately increased: 30 - 300                        Severely increased:       >300   . Hemoglobin A1C 12/23/2023 7.2 (A)  4.0 - 5.6 % Final  . WBC 12/23/2023 5.4  3.4 -  10.8 x10E3/uL Final  . RBC 12/23/2023 5.57  4.14 - 5.80 x10E6/uL Final  . Hemoglobin 12/23/2023 15.4  13.0 - 17.7 g/dL Final  . Hematocrit 93/73/7974 48.4  37.5 - 51.0 % Final  . MCV 12/23/2023 87  79 - 97 fL Final  . MCH 12/23/2023 27.6  26.6 - 33.0 pg Final  . MCHC 12/23/2023 31.8  31.5 - 35.7 g/dL Final  . RDW 93/73/7974 13.0  11.6 - 15.4 % Final  . Platelets 12/23/2023 248  150 - 450 x10E3/uL Final  . Glucose 12/23/2023 140 (H)  70 - 99 mg/dL Final  . BUN 93/73/7974 20  8 - 27 mg/dL Final  . Creatinine, Ser 12/23/2023 1.06  0.76 - 1.27 mg/dL Final  . eGFR 93/73/7974 79  >59 mL/min/1.73 Final  . BUN/Creatinine Ratio 12/23/2023 19  10 - 24 Final  . Sodium 12/23/2023 137  134 - 144  mmol/L Final  . Potassium 12/23/2023 4.7  3.5 - 5.2 mmol/L Final  . Chloride 12/23/2023 101  96 - 106 mmol/L Final  . CO2 12/23/2023 18 (L)  20 - 29 mmol/L Final  . Calcium  12/23/2023 10.3 (H)  8.6 - 10.2 mg/dL Final  . Total Protein 12/23/2023 7.2  6.0 - 8.5 g/dL Final  . Albumin 93/73/7974 4.9  3.9 - 4.9 g/dL Final  . Globulin, Total 12/23/2023 2.3  1.5 - 4.5 g/dL Final  . Bilirubin Total 12/23/2023 0.7  0.0 - 1.2 mg/dL Final  . Alkaline Phosphatase 12/23/2023 84  44 - 121 IU/L Final  . AST 12/23/2023 50 (H)  0 - 40 IU/L Final  . ALT 12/23/2023 61 (H)  0 - 44 IU/L Final  . Cholesterol, Total 12/23/2023 156  100 - 199 mg/dL Final  . Triglycerides 12/23/2023 109  0 - 149 mg/dL Final  . HDL 93/73/7974 40  >39 mg/dL Final  . VLDL Cholesterol Cal 12/23/2023 20  5 - 40 mg/dL Final  . LDL Chol Calc (NIH) 12/23/2023 96  0 - 99 mg/dL Final  . Chol/HDL Ratio 12/23/2023 3.9  0.0 - 5.0 ratio Final   Comment:                                   T. Chol/HDL Ratio                                             Men  Women                               1/2 Avg.Risk  3.4    3.3                                   Avg.Risk  5.0    4.4                                2X Avg.Risk  9.6    7.1                                3X Avg.Risk 23.4   11.0   .  Prostate Specific Ag, Serum 12/23/2023 1.0  0.0 - 4.0 ng/mL Final   Comment: Roche ECLIA methodology. According to the American Urological Association, Serum PSA should decrease and remain at undetectable levels after radical prostatectomy. The AUA defines biochemical recurrence as an initial PSA value 0.2 ng/mL or greater followed by a subsequent confirmatory PSA value 0.2 ng/mL or greater. Values obtained with different assay methods or kits cannot be used interchangeably. Results cannot be interpreted as absolute evidence of the presence or absence of malignant disease.     Blood Alcohol  level:  Lab Results  Component Value Date   [facility] <15 03/29/2024    ETH <15 01/24/2024    Metabolic Disorder Labs: Lab Results  Component Value Date   HGBA1C 7.2 (A) 12/23/2023   MPG 159.94 02/15/2023   MPG 142.72 08/07/2022   Lab Results  Component Value Date   PROLACTIN 13.0 03/17/2020   Lab Results  Component Value Date   CHOL 143 03/28/2024   TRIG 130 03/28/2024   HDL 50 03/28/2024   CHOLHDL 2.9 03/28/2024   VLDL 44 (H) 02/15/2023   LDLCALC 70 03/28/2024   LDLCALC 96 12/23/2023    Therapeutic Lab Levels: No results found for: LITHIUM No results found for: VALPROATE No results found for: CBMZ  Physical Findings   AIMS    Flowsheet Row Admission (Discharged) [facility] Ad[facility] (Discharged) from [facility] Admission (Discharged) from [facility] Admission (Discharged) from OP [facility] Ad[facility] (Discharged) [facility]  AIMS Total Score 0 0 0 0 0   AUDIT    Flowsheet Row Office Visit from 11/16/2022 in [facility] Office Visit from 08/27/2020 in [facility] Admission (Discharged) from [facility] Ad[facility] (Discharged[facility] Admission (Discharged) from [facility]  Alcohol  Use Disorder Identification Test Final Score (AUDIT) 24 27 28 13 25    GAD-7    Flowsheet Row Office Visit from 08/27/2023 in [facility] Counselor from 05/17/2023 in [facility] Office Visit from 02/15/2023 in [facility] Clinical Support from 02/24/2022 in [facility] Office Visit from 01/08/2022 in [facility]  Total GAD-7 Score 0 12 10 2 12    PHQ2-9    Flowsheet Row ED from 03/29/2024 in [facility] ED from 01/25/2024 in [facility] ED from 01/24/2024 in [facility] Office Visit from 12/23/2023 in [facility] Of [facility] Office Visit from 08/27/2023 in [facility] Of [facility]  PHQ-2 Total Score 4 0 0 0 0  PHQ-9 Total Score 14 -- -- 3 0   Flowsheet Row ED from 03/29/2024 in [facility] Most recent reading at 03/29/2024  5:40 AM ED from 03/29/2024 in [facility] Most recent reading at 03/28/2024  8:43 PM UC from 03/14/2024 in [facility] Most recent reading at 03/14/2024  9:21 AM  C-SSRS RISK CATEGORY No Risk No Risk No Risk     Musculoskeletal  Strength & Muscle Tone: {desc; muscle tone:32375} Gait & Station: {PE GAIT ED WJUO:77474} Patient leans: {Patient Leans:[id number]}  Psychiatric Specialty Exam  Presentation  General Appearance:  Casual  Eye Contact: Good  Speech: Clear and Coherent  Speech Volume: Normal  Handedness: Right   Mood and Affect  Mood: Depressed  Affect: Congruent; Flat   Thought Process  Thought Processes: Coherent; Goal Directed  Descriptions of Associations:Intact  Orientation:Full (Time, Place and Person)  Thought Content:WDL  Diagnosis of Schizophrenia or Schizoaffective disorder in past: No    Hallucinations:Hallucinations: None  Ideas of Reference:None  Suicidal Thoughts:Suicidal Thoughts: No  Homicidal Thoughts:Homicidal Thoughts:  No   Sensorium  Memory: Immediate Good  Judgment: Intact  Insight: Present   Executive Functions  Concentration: Good  Attention Span: Good  Recall: Good  Fund of Knowledge: Good  Language: Good   Psychomotor Activity  Psychomotor Activity: Psychomotor Activity: Normal   Assets  Assets: Communication Skills; Desire for Improvement; Social Support   Sleep  Sleep: Sleep: Poor  Estimated Sleeping Duration (Last 24 Hours): 8.25-9.00 hours  Nutritional Assessment (For [facility] and [facility] admissions only) Has the patient had a weight loss or gain of 10 pounds or more in the last 3 months?: No Has the patient had a decrease in food intake/or appetite?: No Does the patient have dental problems?: No Does the patient have eating habits or behaviors that may be indicators of an eating disorder including binging or inducing vomiting?: No Has the patient recently lost weight without trying?: 0 Has the patient been eating poorly because of a decreased appetite?: 0 Malnutrition Screening Tool Score: 0    Physical Exam  Physical Exam ROS Blood pressure (!) 141/103, pulse 74, temperature 98.8 F (37.1 C), temperature source Oral, resp. rate 16, SpO2 99%. There is no height or weight on file to calculate BMI.  Treatment Plan Summary: {CHL [facility] [place]. EOJW:[id number]}  Suzen Lesches, NP 03/30/2024 12:02 AM

## 2024-03-31 DIAGNOSIS — E1165 Type 2 diabetes mellitus with hyperglycemia: Secondary | ICD-10-CM | POA: Diagnosis not present

## 2024-03-31 DIAGNOSIS — F331 Major depressive disorder, recurrent, moderate: Secondary | ICD-10-CM | POA: Diagnosis not present

## 2024-03-31 DIAGNOSIS — F1020 Alcohol dependence, uncomplicated: Secondary | ICD-10-CM | POA: Diagnosis not present

## 2024-03-31 DIAGNOSIS — G4700 Insomnia, unspecified: Secondary | ICD-10-CM | POA: Diagnosis not present

## 2024-03-31 NOTE — ED Notes (Signed)
 Stable. A&O x 4.  Discharging to home/self care   Medication sent to pt preferred pharmacy   Denies current SI plan and Intent.  Denies HI and A/V hallucinations.   All belongings returned to PT.  F/U instruction reviewed.  Pt verbalized understanding

## 2024-03-31 NOTE — Group Note (Signed)
 Group Topic: Communication  Group Date: 03/31/2024 Start Time: 0800 End Time: 0830 Facilitators: Judi Monico RAMAN, NT  Department: [facility]  Number of Participants: 11  Group Focus: community group Treatment Modality:  Psychoeducation Interventions utilized were clarification, orientation, and support Purpose: improve communication skills and increase insight  Name: [name] Date of Birth: [birth year]  MR: [id number]    Level of Participation: active Quality of Participation: attentive Interactions with others: gave feedback Mood/Affect: appropriate Triggers (if applicable): n/a Cognition: coherent/clear Progress: Moderate Response: Pt expressed understanding of unit expectations and expressed any needs. Plan: patient will be encouraged to attend future groups  Patients Problems:  Patient Active Problem List   Diagnosis Date Noted   Alcohol  use disorder, severe, dependence ([name]) 01/25/2024   Scrotal pain, right 08/26/2023   Epididymitis, right 08/26/2023   Complex renal cyst, right, 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/26/2023   Impingement syndrome of right shoulder 07/01/2023   Pain in right shoulder 06/16/2023   Anxiety disorder, unspecified 02/16/2023   Alcohol  use disorder 02/16/2023   Sleep disorder, unspecified 02/16/2023   Alcohol  abuse 02/15/2023   Alcohol  dependence ([name]) 05/16/2022   Low energy 03/06/2022   Moderate alcohol  dependence in early remission ([name]) 02/24/2022   Suicidal ideation 07/07/2021   Generalized anxiety disorder 07/07/2021   Other allergic rhinitis 09/09/2020   Adverse reaction to food, subsequent encounter 09/09/2020   History of bee sting allergy 09/09/2020   Alcohol  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) 03/19/2020   MDD (major depressive disorder), recurrent episode, severe ([name]) 03/18/2020   History of substance abuse ([name]) 01/04/2020   Severe 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 features ([name])  12/10/2019   Tinea cruris 07/06/2019   Hemoglobin A1c less than 7.0% 07/06/2019   Muscle strain of wrist 04/05/2019   Pain in both wrists 04/05/2019   Anxiety 04/05/2019   Prediabetes 04/05/2019   MDD (major depressive disorder) 01/12/2019   Chest pain 10/25/2018   MDD (major depressive disorder), recurrent severe, without psychosis ([name]) 08/15/2018   Influenza A 07/17/2017   Lactic acidosis    Cough    Nausea and vomiting    Severe episode of recurrent major depressive disorder, without psychotic features ([name])    MDD (major depressive disorder), single episode, severe with psychotic features ([name]) 06/27/2017   MDD (major depressive disorder), severe ([name]) 06/23/2017   Essential hypertension 01/13/2016   Unstable angina ([name])    ED (erectile dysfunction) 02/15/2014   Atypical chest pain 12/14/2011   Allergy to contrast media (used for diagnostic x-rays) 05/12/2011   Dyslipidemia, goal LDL below 70 05/09/2011   DM2 (diabetes mellitus, type 2) ([name]) 07/10/2010   CAD (coronary artery disease) 07/10/2010

## 2024-03-31 NOTE — ED Notes (Signed)
 Pt is sleeping. Respirations are even and labored. Q15 safety checks in place.

## 2024-04-04 ENCOUNTER — Other Ambulatory Visit: Payer: Self-pay | Admitting: Nurse Practitioner

## 2024-04-04 ENCOUNTER — Telehealth: Payer: Self-pay | Admitting: *Deleted

## 2024-04-04 DIAGNOSIS — N529 Male erectile dysfunction, unspecified: Secondary | ICD-10-CM

## 2024-04-04 NOTE — Telephone Encounter (Signed)
 Patient called me and wanted to know if he was cleared for his upcoming procedure and was told to hold his Plavix  5 days prior to the procedure. I told him I would ask [facility] and follow back up with him with and answer. He had no further questions.

## 2024-04-04 NOTE — Telephone Encounter (Signed)
 Please advise [facility]

## 2024-04-09 ENCOUNTER — Other Ambulatory Visit: Payer: Self-pay | Admitting: Nurse Practitioner

## 2024-04-13 ENCOUNTER — Ambulatory Visit: Payer: MEDICAID | Admitting: Nurse Practitioner

## 2024-04-28 ENCOUNTER — Encounter: Payer: Self-pay | Admitting: Nurse Practitioner

## 2024-04-28 ENCOUNTER — Ambulatory Visit (INDEPENDENT_AMBULATORY_CARE_PROVIDER_SITE_OTHER): Payer: MEDICAID | Admitting: Nurse Practitioner

## 2024-04-28 VITALS — BP 140/87 | HR 62 | Wt 187.6 lb

## 2024-04-28 DIAGNOSIS — N529 Male erectile dysfunction, unspecified: Secondary | ICD-10-CM | POA: Diagnosis not present

## 2024-04-28 DIAGNOSIS — E785 Hyperlipidemia, unspecified: Secondary | ICD-10-CM

## 2024-04-28 DIAGNOSIS — E1159 Type 2 diabetes mellitus with other circulatory complications: Secondary | ICD-10-CM | POA: Diagnosis not present

## 2024-04-28 LAB — POCT GLYCOSYLATED HEMOGLOBIN (HGB A1C): Hemoglobin A1C: 6.4 % — AB (ref 4.0–5.6)

## 2024-04-28 MED ORDER — FLUTICASONE PROPIONATE 50 MCG/ACT NA SUSP: 2.0000 | Freq: Every day | NASAL | 2 refills | Status: AC

## 2024-04-28 MED ORDER — METOPROLOL TARTRATE 50 MG PO TABS: 50.0000 mg | ORAL_TABLET | Freq: Two times a day (BID) | ORAL | 3 refills | Status: AC

## 2024-04-28 MED ORDER — AMLODIPINE BESYLATE 10 MG PO TABS: 10.0000 mg | ORAL_TABLET | Freq: Every day | ORAL | 0 refills | Status: DC

## 2024-04-28 MED ORDER — SILDENAFIL CITRATE 100 MG PO TABS: 100.0000 mg | ORAL_TABLET | Freq: Every day | ORAL | 0 refills | Status: AC | PRN

## 2024-04-28 MED ORDER — EZETIMIBE 10 MG PO TABS: 10.0000 mg | ORAL_TABLET | Freq: Every day | ORAL | 0 refills | Status: DC

## 2024-04-28 MED ORDER — CLOPIDOGREL BISULFATE 75 MG PO TABS: 75.0000 mg | ORAL_TABLET | Freq: Every day | ORAL | 0 refills | Status: DC

## 2024-04-28 MED ORDER — METFORMIN HCL 1000 MG PO TABS: 1000.0000 mg | ORAL_TABLET | Freq: Two times a day (BID) | ORAL | 3 refills | Status: AC

## 2024-04-28 MED ORDER — ASPIRIN 81 MG PO TBEC: 81.0000 mg | DELAYED_RELEASE_TABLET | Freq: Every day | ORAL | 2 refills | Status: AC

## 2024-04-28 MED ORDER — THIAMINE HCL 100 MG PO TABS: 100.0000 mg | ORAL_TABLET | Freq: Every day | ORAL | 0 refills | Status: AC

## 2024-04-28 NOTE — Progress Notes (Signed)
 Subjective   Patient ID: [name], male    DOB: 06-01-1961, 63 y.o.   MRN: [id number]  Chief Complaint  Patient presents with   Follow-up   Diabetes   Medication Refill    All medications    Referring provider: Oley Bascom RAMAN, NP  SHAFT CORIGLIANO is a 63 y.o. male with Past Medical History: No date: Alcohol  dependence ([name]) No date: Allergies No date: Arthritis No date: Chest pain No date: Chronic lower back pain No date: Chronic pain of right wrist No date: Cocaine abuse ([name])     Comment:  per note on 01/18/23 No date: Coronary artery disease     Comment:  a. Multiple prior caths/PCI. Cath 2013 with possible               spasm of RCA, 70% ISR of mid LCx with subsequent DES to               mLCx and prox LCX. b. H/o microvascular angina. c.               Recurrent angina 08/2014 - s/p PTCA/DES to prox Cx,               PTCA/CBA to OM1.  c. LHC 06/10/15 with patent stents and               some ISR in LCX and OM-1 that was not flow limiting -->               Rx  No date: Dyslipidemia     Comment:  a. Intolerant to many statins except tolerating Livalo . No date: GERD (gastroesophageal reflux disease) 10/25/2018: H/O cardiac catheterization No date: Heart attack [facility]) No date: Hypertension ~ 2010: Myocardial infarction ([name]) No date: Renal mass 12/15/2011: S/P angioplasty with stent, DES, to proximal and mid LCX  12/15/11 04/2021: S/P foot surgery, right No date: Shoulder pain No date: Stroke [facility])     Comment:  pt. reports had a stroke around time of MI 2010 No date: Type II diabetes mellitus ([name]) No date: Unstable angina [facility])   HPI  [name] is in today for diabetes follow up. The prescribed treatment is metformin  500 mg  along with therapy zetia . The reported use of treatment is consistent with prescribed. He denies reported side effects from the treatment. Home glucose monitoring indicates a CBG normal range. His goal is to consisently eating healthy.  He  states that he has not been doing well with diet or exercise. There has been a professional eye exam in the year. A1C in office today was 6.4.  Denies f/c/s, n/v/d, hemoptysis, PND, leg swelling Denies chest pain or edema   Allergies  Allergen Reactions   Bee Venom Anaphylaxis, Hives, Itching and Other (See Comments)    Red eyes   Gadolinium Hives, Itching, Swelling and Other (See Comments)    Swelling of eyes after receiving MR contrast. 13 hr prep recommended. Pt developed large hive and swelling under right eye after contrast, given 50mg  iv benadryl , pt will need premed before gadolinium//lh   Shellfish Allergy Anaphylaxis, Hives, Itching and Other (See Comments)    Red eyes   Statins Other (See Comments)    Myalgias. Tolerating livalo . Pain    Testosterone  Cypionate Other (See Comments)    Testerone Injection --Increased breast tissue     Immunization History  Administered Date(s) Administered   Influenza,inj,Quad PF,6+ Mos 07/28/2017, 04/01/2018, 03/05/2021   MODERNA COVID-19 SARS-COV-2  PEDS BIVALENT BOOSTER 13yr-42yr 12/09/2019, 01/10/2020   Moderna SARS-COV2 Booster Vaccination 08/13/2020   Pneumococcal Polysaccharide-23 07/28/2017, 03/05/2021   Pneumococcal-Unspecified 06/29/2009   Tdap 02/08/2020   Zoster Recombinant(Shingrix) 01/11/2013    Tobacco History: Social History   Tobacco Use  Smoking Status Former   Current packs/day: 0.00   Average packs/day: 1 pack/day for 10.0 years (10.0 ttl pk-yrs)   Types: Cigarettes   Start date: 10/06/2008   Quit date: 10/07/2018   Years since quitting: 5.5  Smokeless Tobacco Never   Counseling given: Not Answered   Outpatient Encounter Medications as of 04/28/2024  Medication Sig   ACCU-CHEK GUIDE TEST test strip USE 1 STRIP TO CHECK GLUCOSE THREE TIMES DAILY AS DIRECTED   Cholecalciferol  25 MCG (1000 UT) tablet Take 2 tablets (2,000 Units total) by mouth daily.   EPINEPHrine  0.3 mg/0.3 mL IJ SOAJ injection Inject 0.3 mg  into the muscle as needed for anaphylaxis.   gabapentin  (NEURONTIN ) 300 MG capsule Take 1 capsule (300 mg total) by mouth 3 (three) times daily. (Patient taking differently: Take 300 mg by mouth 3 (three) times daily as needed (For pain).)   metFORMIN  (GLUCOPHAGE ) 1000 MG tablet Take 1 tablet (1,000 mg total) by mouth 2 (two) times daily with a meal.   Multiple Vitamin (MULTIVITAMIN WITH MINERALS) TABS tablet Take 1 tablet by mouth daily.   Multiple Vitamins-Minerals (MULTIVITAMIN WITH MINERALS) tablet Take 1 tablet by mouth daily.   nitroGLYCERIN  (NITROSTAT ) 0.4 MG SL tablet Place 1 tablet (0.4 mg total) under the tongue every 5 (five) minutes x 3 doses as needed for chest pain.   Polyethyl Glycol-Propyl Glycol (SYSTANE) 0.4-0.3 % GEL ophthalmic gel Place 1 Application into both eyes daily as needed (For dry eyes).   [DISCONTINUED] amLODipine  (NORVASC ) 10 MG tablet Take 1 tablet by mouth once daily   [DISCONTINUED] aspirin  EC 81 MG tablet Take 81 mg by mouth daily.   [DISCONTINUED] clopidogrel  (PLAVIX ) 75 MG tablet Take 1 tablet by mouth once daily   [DISCONTINUED] ezetimibe  (ZETIA ) 10 MG tablet Take 1 tablet (10 mg total) by mouth daily.   [DISCONTINUED] fluticasone  (FLONASE ) 50 MCG/ACT nasal spray Place 2 sprays into both nostrils daily. (Patient taking differently: Place 2 sprays into both nostrils daily as needed for allergies.)   [DISCONTINUED] metFORMIN  (GLUCOPHAGE ) 500 MG tablet Take 500 mg by mouth 2 (two) times daily with a meal.   [DISCONTINUED] metoprolol  tartrate (LOPRESSOR ) 50 MG tablet Take 1 tablet (50 mg total) by mouth 2 (two) times daily.   [DISCONTINUED] sildenafil  (VIAGRA ) 100 MG tablet TAKE 1 TABLET BY MOUTH ONCE DAILY AS NEEDED   [DISCONTINUED] thiamine  (VITAMIN B1) 100 MG tablet Take 1 tablet (100 mg total) by mouth daily.   amLODipine  (NORVASC ) 10 MG tablet Take 1 tablet (10 mg total) by mouth daily.   aspirin  EC 81 MG tablet Take 1 tablet (81 mg total) by mouth daily.    clopidogrel  (PLAVIX ) 75 MG tablet Take 1 tablet (75 mg total) by mouth daily.   ezetimibe  (ZETIA ) 10 MG tablet Take 1 tablet (10 mg total) by mouth daily.   fluticasone  (FLONASE ) 50 MCG/ACT nasal spray Place 2 sprays into both nostrils daily.   metoprolol  tartrate (LOPRESSOR ) 50 MG tablet Take 1 tablet (50 mg total) by mouth 2 (two) times daily.   sildenafil  (VIAGRA ) 100 MG tablet Take 1 tablet (100 mg total) by mouth daily as needed.   thiamine  (VITAMIN B1) 100 MG tablet Take 1 tablet (100 mg total) by mouth daily.  No facility-administered encounter medications on file as of 04/28/2024.    Review of Systems  Review of Systems  Constitutional: Negative.   HENT: Negative.    Cardiovascular: Negative.   Gastrointestinal: Negative.   Allergic/Immunologic: Negative.   Neurological: Negative.   Psychiatric/Behavioral: Negative.       Objective:   BP (!) 140/87 (BP Location: Right Arm, Patient Position: Sitting, Cuff Size: Large)   Pulse 62   Wt 187 lb 9.6 oz (85.1 kg)   SpO2 98%   BMI 28.52 kg/m   Wt Readings from Last 5 Encounters:  04/28/24 187 lb 9.6 oz (85.1 kg)  03/28/24 187 lb 3.2 oz (84.9 kg)  12/23/23 192 lb 3.2 oz (87.2 kg)  09/23/23 195 lb (88.5 kg)  08/27/23 195 lb 6.4 oz (88.6 kg)     Physical Exam Vitals and nursing note reviewed.  Constitutional:      General: He is not in acute distress.    Appearance: He is well-developed.  Cardiovascular:     Rate and Rhythm: Normal rate and regular rhythm.  Pulmonary:     Effort: Pulmonary effort is normal.     Breath sounds: Normal breath sounds.  Skin:    General: Skin is warm and dry.  Neurological:     Mental Status: He is alert and oriented to person, place, and time.       Assessment & Plan:   Type 2 diabetes mellitus with other circulatory complication, without long-term current use of insulin  ([name]) -     POCT glycosylated hemoglobin (Hb A1C)  Hyperlipidemia, unspecified hyperlipidemia type -      Ezetimibe ; Take 1 tablet (10 mg total) by mouth daily.  Dispense: 90 tablet; Refill: 0  Erectile dysfunction, unspecified erectile dysfunction type -     Sildenafil  Citrate; Take 1 tablet (100 mg total) by mouth daily as needed.  Dispense: 10 tablet; Refill: 0  Other orders -     amLODIPine  Besylate; Take 1 tablet (10 mg total) by mouth daily.  Dispense: 90 tablet; Refill: 0 -     Aspirin ; Take 1 tablet (81 mg total) by mouth daily.  Dispense: 30 tablet; Refill: 2 -     Clopidogrel  Bisulfate; Take 1 tablet (75 mg total) by mouth daily.  Dispense: 90 tablet; Refill: 0 -     Fluticasone  Propionate; Place 2 sprays into both nostrils daily.  Dispense: 9.9 mL; Refill: 2 -     Metoprolol  Tartrate; Take 1 tablet (50 mg total) by mouth 2 (two) times daily.  Dispense: 180 tablet; Refill: 3 -     metFORMIN  HCl; Take 1 tablet (1,000 mg total) by mouth 2 (two) times daily with a meal.  Dispense: 180 tablet; Refill: 3 -     Thiamine  HCl; Take 1 tablet (100 mg total) by mouth daily.  Dispense: 30 tablet; Refill: 0     Return in about 3 months (around 07/29/2024).   Bascom GORMAN Borer, NP 04/28/2024

## 2024-05-01 ENCOUNTER — Encounter: Payer: Self-pay | Admitting: Radiology

## 2024-05-05 ENCOUNTER — Other Ambulatory Visit: Payer: Self-pay | Admitting: Internal Medicine

## 2024-05-10 ENCOUNTER — Encounter: Payer: Self-pay | Admitting: *Deleted

## 2024-07-13 ENCOUNTER — Ambulatory Visit: Payer: MEDICAID | Admitting: Nurse Practitioner

## 2024-07-26 ENCOUNTER — Ambulatory Visit: Payer: Self-pay

## 2024-07-26 NOTE — Telephone Encounter (Signed)
 FYI Only or Action Required?: FYI only for provider: urgent care.  Patient was last seen in primary care on 04/28/2024 by Oley Bascom RAMAN, NP.  Called Nurse Triage reporting Urinary Retention and Urinary Frequency, Knee Pain.  Symptoms began several weeks ago.  Interventions attempted: Nothing.  Symptoms are: gradually worsening.  Triage Disposition: See Physician Within 24 Hours  Patient/caregiver understands and will follow disposition?: Yes                      Message from [place] R sent at 07/26/2024  3:10 PM EST  Symp getting worst Pain in knee, as well as groin area. Urgency to urinate but nothing comes out. Pt would like to be seen sooner than he is schedule.   Reason for Disposition  Urinating more frequently than usual (i.e., frequency) OR new-onset of the feeling of an urgent need to urinate (i.e., urgency)  Answer Assessment - Initial Assessment Questions 1. SYMPTOM: What's the main symptom you're concerned about? (e.g., frequency, incontinence)     Urinary retention- Passing urine (more than a few drops in the past 4 hours), urinary frequency and hesitancy   2. ONSET: When did the  symptoms  start?     2 weeks ago.  3. PAIN: Is there any pain? If Yes, ask: How bad is it? (Scale: 1-10; mild, moderate, severe)     Moderate bladder discomfort when bending over  4. CAUSE: What do you think is causing the symptoms?     Unsure.  5. OTHER SYMPTOMS: Do you have any other symptoms? (e.g., blood in urine, fever, flank pain, pain with urination)     Unsure if he has a fever (doesn't have a thermometer to check). Back pain for over 6 months.  Hot flashes/chills. Sharp 1/10 right knee pain and mild swelling. Fall 1.5 weeks ago when getting on bus, fell onto face. No bruising/swelling/wounds/confusion from fall.  Protocols used: Urinary Symptoms-A-AH

## 2024-07-27 ENCOUNTER — Other Ambulatory Visit: Payer: Self-pay | Admitting: Nurse Practitioner

## 2024-07-27 DIAGNOSIS — E785 Hyperlipidemia, unspecified: Secondary | ICD-10-CM

## 2024-07-27 NOTE — Telephone Encounter (Signed)
 clopidogrel  (PLAVIX ) 75 MG tablet [Pharmacy Med Name: Clopidogrel  Bisulfate 75 MG Oral Tablet]      amLODipine  (NORVASC ) 10 MG tablet [Pharmacy Med Name: amLODIPine  Besylate 10 MG Oral Tablet]      ezetimibe  (ZETIA ) 10 MG tablet [Pharmacy Med Name: Ezetimibe  10 MG Oral Tablet]

## 2024-08-04 ENCOUNTER — Telehealth (HOSPITAL_COMMUNITY): Payer: Self-pay | Admitting: Licensed Clinical Social Worker

## 2024-08-04 ENCOUNTER — Ambulatory Visit: Payer: Self-pay | Admitting: Nurse Practitioner

## 2024-08-04 NOTE — Telephone Encounter (Signed)
 The therapist calls [name] and he asks to call back which he does with the therapist confirming his identity via two identifiers. He has not done well the past few months having had two surgeries with one more scheduled in March on his lower back. [name] says that he was given the option of taking pain medication or drinking wine and chose both drinking mostly wine. Today is his 6th day sober. He has been doing a lot of on-line meetings but no in person.   He rates his pain currently as a 0. He schedules to come in for an assessment on 08/07/24 at 1 p.m.   Kenneth Maier, MA, LCSW, [facility], LCAS 08/04/2024

## 2024-08-08 ENCOUNTER — Ambulatory Visit: Payer: MEDICAID | Admitting: Physician Assistant

## 2024-08-16 ENCOUNTER — Ambulatory Visit: Payer: MEDICAID | Admitting: Nurse Practitioner
